# Patient Record
Sex: Male | Born: 1945 | Race: Black or African American | Hispanic: No | Smoking: Former smoker
Health system: Southern US, Community
[De-identification: ages and names within clinical notes are randomized; demographics above are authoritative.]

## PROBLEM LIST (undated history)

## (undated) DIAGNOSIS — H548 Legal blindness, as defined in USA: Secondary | ICD-10-CM

## (undated) DIAGNOSIS — I10 Essential (primary) hypertension: Secondary | ICD-10-CM

## (undated) DIAGNOSIS — A419 Sepsis, unspecified organism: Secondary | ICD-10-CM

## (undated) DIAGNOSIS — K219 Gastro-esophageal reflux disease without esophagitis: Secondary | ICD-10-CM

## (undated) DIAGNOSIS — I447 Left bundle-branch block, unspecified: Secondary | ICD-10-CM

## (undated) DIAGNOSIS — I959 Hypotension, unspecified: Secondary | ICD-10-CM

## (undated) DIAGNOSIS — I43 Cardiomyopathy in diseases classified elsewhere: Secondary | ICD-10-CM

## (undated) DIAGNOSIS — R0602 Shortness of breath: Secondary | ICD-10-CM

## (undated) DIAGNOSIS — E11622 Type 2 diabetes mellitus with other skin ulcer: Secondary | ICD-10-CM

## (undated) DIAGNOSIS — R195 Other fecal abnormalities: Secondary | ICD-10-CM

## (undated) DIAGNOSIS — I2510 Atherosclerotic heart disease of native coronary artery without angina pectoris: Secondary | ICD-10-CM

## (undated) DIAGNOSIS — I119 Hypertensive heart disease without heart failure: Secondary | ICD-10-CM

## (undated) DIAGNOSIS — N400 Enlarged prostate without lower urinary tract symptoms: Secondary | ICD-10-CM

## (undated) DIAGNOSIS — K436 Other and unspecified ventral hernia with obstruction, without gangrene: Secondary | ICD-10-CM

## (undated) DIAGNOSIS — G4733 Obstructive sleep apnea (adult) (pediatric): Secondary | ICD-10-CM

## (undated) DIAGNOSIS — M1990 Unspecified osteoarthritis, unspecified site: Secondary | ICD-10-CM

## (undated) DIAGNOSIS — L97109 Non-pressure chronic ulcer of unspecified thigh with unspecified severity: Secondary | ICD-10-CM

## (undated) DIAGNOSIS — E785 Hyperlipidemia, unspecified: Secondary | ICD-10-CM

## (undated) DIAGNOSIS — IMO0002 Reserved for concepts with insufficient information to code with codable children: Secondary | ICD-10-CM

## (undated) DIAGNOSIS — Z950 Presence of cardiac pacemaker: Secondary | ICD-10-CM

## (undated) DIAGNOSIS — B009 Herpesviral infection, unspecified: Secondary | ICD-10-CM

## (undated) DIAGNOSIS — E1165 Type 2 diabetes mellitus with hyperglycemia: Secondary | ICD-10-CM

## (undated) DIAGNOSIS — I4891 Unspecified atrial fibrillation: Secondary | ICD-10-CM

## (undated) DIAGNOSIS — E1142 Type 2 diabetes mellitus with diabetic polyneuropathy: Secondary | ICD-10-CM

## (undated) HISTORY — PX: EYE SURGERY: SHX253

## (undated) HISTORY — DX: Legal blindness, as defined in USA: H54.8

## (undated) HISTORY — DX: Type 2 diabetes mellitus with hyperglycemia: E11.65

## (undated) HISTORY — DX: Essential (primary) hypertension: I10

## (undated) HISTORY — DX: Obstructive sleep apnea (adult) (pediatric): G47.33

## (undated) HISTORY — DX: Unspecified osteoarthritis, unspecified site: M19.90

## (undated) HISTORY — DX: Presence of cardiac pacemaker: Z95.0

## (undated) HISTORY — PX: CATARACT EXTRACTION, BILATERAL: SHX1313

## (undated) HISTORY — DX: Left bundle-branch block, unspecified: I44.7

## (undated) HISTORY — DX: Other and unspecified ventral hernia with obstruction, without gangrene: K43.6

## (undated) HISTORY — DX: Herpesviral infection, unspecified: B00.9

## (undated) HISTORY — DX: Other fecal abnormalities: R19.5

## (undated) HISTORY — DX: Atherosclerotic heart disease of native coronary artery without angina pectoris: I25.10

## (undated) HISTORY — DX: Type 2 diabetes mellitus with other skin ulcer: E11.622

## (undated) HISTORY — DX: Cardiomyopathy in diseases classified elsewhere: I43

## (undated) HISTORY — DX: Hyperlipidemia, unspecified: E78.5

## (undated) HISTORY — DX: Morbid (severe) obesity due to excess calories: E66.01

## (undated) HISTORY — DX: Non-pressure chronic ulcer of unspecified thigh with unspecified severity: L97.109

## (undated) HISTORY — PX: HERNIA REPAIR: SHX51

## (undated) HISTORY — PX: REFRACTIVE SURGERY: SHX103

## (undated) HISTORY — DX: Reserved for concepts with insufficient information to code with codable children: IMO0002

## (undated) HISTORY — DX: Hypertensive heart disease without heart failure: I11.9

## (undated) HISTORY — DX: Benign prostatic hyperplasia without lower urinary tract symptoms: N40.0

---

## 1998-02-14 ENCOUNTER — Encounter: Admission: RE | Admit: 1998-02-14 | Discharge: 1998-02-14 | Payer: Self-pay | Admitting: Internal Medicine

## 1998-10-16 ENCOUNTER — Encounter: Admission: RE | Admit: 1998-10-16 | Discharge: 1998-10-16 | Payer: Self-pay | Admitting: Internal Medicine

## 1998-10-16 ENCOUNTER — Emergency Department (HOSPITAL_COMMUNITY): Admission: EM | Admit: 1998-10-16 | Discharge: 1998-10-16 | Payer: Self-pay | Admitting: Emergency Medicine

## 1999-01-29 ENCOUNTER — Encounter: Admission: RE | Admit: 1999-01-29 | Discharge: 1999-01-29 | Payer: Self-pay | Admitting: Internal Medicine

## 1999-04-26 ENCOUNTER — Emergency Department (HOSPITAL_COMMUNITY): Admission: EM | Admit: 1999-04-26 | Discharge: 1999-04-26 | Payer: Self-pay | Admitting: Emergency Medicine

## 1999-05-27 ENCOUNTER — Encounter: Admission: RE | Admit: 1999-05-27 | Discharge: 1999-05-27 | Payer: Self-pay | Admitting: Internal Medicine

## 1999-06-16 ENCOUNTER — Encounter: Admission: RE | Admit: 1999-06-16 | Discharge: 1999-06-16 | Payer: Self-pay | Admitting: Internal Medicine

## 1999-08-18 ENCOUNTER — Emergency Department (HOSPITAL_COMMUNITY): Admission: EM | Admit: 1999-08-18 | Discharge: 1999-08-18 | Payer: Self-pay | Admitting: Emergency Medicine

## 1999-08-18 ENCOUNTER — Encounter: Payer: Self-pay | Admitting: Emergency Medicine

## 1999-09-29 ENCOUNTER — Encounter: Admission: RE | Admit: 1999-09-29 | Discharge: 1999-09-29 | Payer: Self-pay | Admitting: Internal Medicine

## 1999-11-04 ENCOUNTER — Encounter: Payer: Self-pay | Admitting: Emergency Medicine

## 1999-11-04 ENCOUNTER — Emergency Department (HOSPITAL_COMMUNITY): Admission: EM | Admit: 1999-11-04 | Discharge: 1999-11-04 | Payer: Self-pay | Admitting: Emergency Medicine

## 2000-03-01 ENCOUNTER — Encounter: Admission: RE | Admit: 2000-03-01 | Discharge: 2000-03-01 | Payer: Self-pay | Admitting: Internal Medicine

## 2000-03-15 ENCOUNTER — Encounter: Admission: RE | Admit: 2000-03-15 | Discharge: 2000-03-15 | Payer: Self-pay | Admitting: Internal Medicine

## 2000-03-24 ENCOUNTER — Encounter: Admission: RE | Admit: 2000-03-24 | Discharge: 2000-03-24 | Payer: Self-pay | Admitting: Internal Medicine

## 2000-08-01 ENCOUNTER — Encounter: Admission: RE | Admit: 2000-08-01 | Discharge: 2000-08-01 | Payer: Self-pay | Admitting: Internal Medicine

## 2000-08-03 ENCOUNTER — Encounter: Admission: RE | Admit: 2000-08-03 | Discharge: 2000-08-03 | Payer: Self-pay

## 2000-12-19 ENCOUNTER — Encounter: Admission: RE | Admit: 2000-12-19 | Discharge: 2000-12-19 | Payer: Self-pay | Admitting: Internal Medicine

## 2001-01-09 ENCOUNTER — Encounter: Payer: Self-pay | Admitting: Emergency Medicine

## 2001-01-09 ENCOUNTER — Emergency Department (HOSPITAL_COMMUNITY): Admission: EM | Admit: 2001-01-09 | Discharge: 2001-01-09 | Payer: Self-pay

## 2001-01-22 ENCOUNTER — Emergency Department (HOSPITAL_COMMUNITY): Admission: EM | Admit: 2001-01-22 | Discharge: 2001-01-22 | Payer: Self-pay | Admitting: *Deleted

## 2001-01-22 ENCOUNTER — Encounter: Payer: Self-pay | Admitting: Emergency Medicine

## 2001-05-25 ENCOUNTER — Encounter: Admission: RE | Admit: 2001-05-25 | Discharge: 2001-05-25 | Payer: Self-pay

## 2001-06-07 ENCOUNTER — Encounter: Admission: RE | Admit: 2001-06-07 | Discharge: 2001-06-07 | Payer: Self-pay

## 2001-06-21 ENCOUNTER — Encounter: Admission: RE | Admit: 2001-06-21 | Discharge: 2001-06-21 | Payer: Self-pay | Admitting: Internal Medicine

## 2002-03-14 ENCOUNTER — Encounter: Admission: RE | Admit: 2002-03-14 | Discharge: 2002-03-14 | Payer: Self-pay | Admitting: Internal Medicine

## 2002-03-15 ENCOUNTER — Encounter: Admission: RE | Admit: 2002-03-15 | Discharge: 2002-03-15 | Payer: Self-pay | Admitting: Internal Medicine

## 2002-06-07 ENCOUNTER — Emergency Department (HOSPITAL_COMMUNITY): Admission: EM | Admit: 2002-06-07 | Discharge: 2002-06-07 | Payer: Self-pay | Admitting: Emergency Medicine

## 2002-06-19 ENCOUNTER — Encounter (INDEPENDENT_AMBULATORY_CARE_PROVIDER_SITE_OTHER): Payer: Self-pay | Admitting: Specialist

## 2002-06-19 ENCOUNTER — Encounter: Admission: RE | Admit: 2002-06-19 | Discharge: 2002-06-19 | Payer: Self-pay | Admitting: Internal Medicine

## 2002-06-19 ENCOUNTER — Other Ambulatory Visit: Admission: RE | Admit: 2002-06-19 | Discharge: 2002-06-19 | Payer: Self-pay | Admitting: *Deleted

## 2002-06-19 DIAGNOSIS — A6000 Herpesviral infection of urogenital system, unspecified: Secondary | ICD-10-CM | POA: Insufficient documentation

## 2002-08-01 ENCOUNTER — Encounter: Admission: RE | Admit: 2002-08-01 | Discharge: 2002-08-01 | Payer: Self-pay | Admitting: Internal Medicine

## 2002-08-27 ENCOUNTER — Encounter: Admission: RE | Admit: 2002-08-27 | Discharge: 2002-08-27 | Payer: Self-pay | Admitting: Internal Medicine

## 2002-10-15 ENCOUNTER — Encounter: Admission: RE | Admit: 2002-10-15 | Discharge: 2002-10-15 | Payer: Self-pay | Admitting: Internal Medicine

## 2002-10-22 ENCOUNTER — Encounter: Admission: RE | Admit: 2002-10-22 | Discharge: 2002-10-22 | Payer: Self-pay | Admitting: Internal Medicine

## 2002-10-25 ENCOUNTER — Encounter: Payer: Self-pay | Admitting: Internal Medicine

## 2002-10-25 ENCOUNTER — Inpatient Hospital Stay (HOSPITAL_COMMUNITY): Admission: EM | Admit: 2002-10-25 | Discharge: 2002-10-28 | Payer: Self-pay | Admitting: Emergency Medicine

## 2002-10-25 ENCOUNTER — Encounter: Admission: RE | Admit: 2002-10-25 | Discharge: 2002-10-25 | Payer: Self-pay | Admitting: Internal Medicine

## 2002-10-25 ENCOUNTER — Ambulatory Visit (HOSPITAL_COMMUNITY): Admission: RE | Admit: 2002-10-25 | Discharge: 2002-10-25 | Payer: Self-pay | Admitting: Internal Medicine

## 2002-10-27 ENCOUNTER — Encounter: Payer: Self-pay | Admitting: Cardiovascular Disease

## 2002-11-20 ENCOUNTER — Encounter: Admission: RE | Admit: 2002-11-20 | Discharge: 2002-11-20 | Payer: Self-pay | Admitting: Internal Medicine

## 2002-11-27 ENCOUNTER — Encounter: Admission: RE | Admit: 2002-11-27 | Discharge: 2002-11-27 | Payer: Self-pay | Admitting: Internal Medicine

## 2002-11-28 ENCOUNTER — Encounter: Admission: RE | Admit: 2002-11-28 | Discharge: 2002-11-28 | Payer: Self-pay | Admitting: Internal Medicine

## 2002-11-30 ENCOUNTER — Encounter: Admission: RE | Admit: 2002-11-30 | Discharge: 2002-11-30 | Payer: Self-pay | Admitting: Internal Medicine

## 2002-12-03 ENCOUNTER — Encounter: Admission: RE | Admit: 2002-12-03 | Discharge: 2002-12-03 | Payer: Self-pay | Admitting: Internal Medicine

## 2002-12-06 ENCOUNTER — Encounter: Admission: RE | Admit: 2002-12-06 | Discharge: 2003-03-06 | Payer: Self-pay | Admitting: *Deleted

## 2002-12-10 ENCOUNTER — Encounter: Admission: RE | Admit: 2002-12-10 | Discharge: 2002-12-10 | Payer: Self-pay | Admitting: Internal Medicine

## 2002-12-24 ENCOUNTER — Encounter: Admission: RE | Admit: 2002-12-24 | Discharge: 2002-12-24 | Payer: Self-pay | Admitting: Internal Medicine

## 2003-01-29 ENCOUNTER — Encounter: Admission: RE | Admit: 2003-01-29 | Discharge: 2003-01-29 | Payer: Self-pay | Admitting: Internal Medicine

## 2003-04-01 ENCOUNTER — Encounter: Admission: RE | Admit: 2003-04-01 | Discharge: 2003-04-01 | Payer: Self-pay | Admitting: Internal Medicine

## 2003-05-09 ENCOUNTER — Encounter: Admission: RE | Admit: 2003-05-09 | Discharge: 2003-05-09 | Payer: Self-pay | Admitting: Internal Medicine

## 2003-06-13 ENCOUNTER — Encounter: Admission: RE | Admit: 2003-06-13 | Discharge: 2003-06-13 | Payer: Self-pay | Admitting: Internal Medicine

## 2003-08-12 ENCOUNTER — Encounter: Admission: RE | Admit: 2003-08-12 | Discharge: 2003-08-12 | Payer: Self-pay | Admitting: Internal Medicine

## 2003-08-17 DIAGNOSIS — Z950 Presence of cardiac pacemaker: Secondary | ICD-10-CM

## 2003-08-17 HISTORY — DX: Presence of cardiac pacemaker: Z95.0

## 2003-09-24 ENCOUNTER — Encounter: Admission: RE | Admit: 2003-09-24 | Discharge: 2003-09-24 | Payer: Self-pay | Admitting: Internal Medicine

## 2003-10-09 ENCOUNTER — Ambulatory Visit (HOSPITAL_COMMUNITY): Admission: RE | Admit: 2003-10-09 | Discharge: 2003-10-09 | Payer: Self-pay | Admitting: Internal Medicine

## 2003-10-09 ENCOUNTER — Encounter: Admission: RE | Admit: 2003-10-09 | Discharge: 2003-10-09 | Payer: Self-pay | Admitting: Internal Medicine

## 2003-10-15 HISTORY — PX: INSERT / REPLACE / REMOVE PACEMAKER: SUR710

## 2003-10-16 ENCOUNTER — Inpatient Hospital Stay (HOSPITAL_COMMUNITY): Admission: EM | Admit: 2003-10-16 | Discharge: 2003-10-25 | Payer: Self-pay | Admitting: Emergency Medicine

## 2003-10-18 ENCOUNTER — Encounter: Payer: Self-pay | Admitting: Internal Medicine

## 2003-10-22 HISTORY — PX: CARDIAC CATHETERIZATION: SHX172

## 2003-10-26 ENCOUNTER — Ambulatory Visit (HOSPITAL_BASED_OUTPATIENT_CLINIC_OR_DEPARTMENT_OTHER): Admission: RE | Admit: 2003-10-26 | Discharge: 2003-10-26 | Payer: Self-pay | Admitting: Infectious Diseases

## 2003-11-15 ENCOUNTER — Encounter: Admission: RE | Admit: 2003-11-15 | Discharge: 2003-11-15 | Payer: Self-pay | Admitting: Internal Medicine

## 2003-12-15 HISTORY — PX: OTHER SURGICAL HISTORY: SHX169

## 2004-01-09 ENCOUNTER — Inpatient Hospital Stay (HOSPITAL_COMMUNITY): Admission: RE | Admit: 2004-01-09 | Discharge: 2004-01-11 | Payer: Self-pay | Admitting: Cardiovascular Disease

## 2004-04-27 ENCOUNTER — Ambulatory Visit: Payer: Self-pay | Admitting: Internal Medicine

## 2004-05-12 ENCOUNTER — Ambulatory Visit: Payer: Self-pay | Admitting: Internal Medicine

## 2004-05-19 ENCOUNTER — Ambulatory Visit: Payer: Self-pay | Admitting: Internal Medicine

## 2004-08-25 ENCOUNTER — Ambulatory Visit: Payer: Self-pay | Admitting: Internal Medicine

## 2004-08-28 ENCOUNTER — Ambulatory Visit: Payer: Self-pay | Admitting: Internal Medicine

## 2004-10-14 ENCOUNTER — Inpatient Hospital Stay (HOSPITAL_COMMUNITY): Admission: EM | Admit: 2004-10-14 | Discharge: 2004-10-17 | Payer: Self-pay | Admitting: Emergency Medicine

## 2004-10-14 ENCOUNTER — Ambulatory Visit: Payer: Self-pay | Admitting: Infectious Diseases

## 2004-11-02 ENCOUNTER — Ambulatory Visit: Payer: Self-pay | Admitting: Internal Medicine

## 2004-11-02 ENCOUNTER — Ambulatory Visit (HOSPITAL_COMMUNITY): Admission: RE | Admit: 2004-11-02 | Discharge: 2004-11-02 | Payer: Self-pay | Admitting: Internal Medicine

## 2004-11-05 ENCOUNTER — Ambulatory Visit: Payer: Self-pay | Admitting: Internal Medicine

## 2004-11-13 ENCOUNTER — Ambulatory Visit: Payer: Self-pay | Admitting: Internal Medicine

## 2005-05-07 ENCOUNTER — Ambulatory Visit: Payer: Self-pay | Admitting: Internal Medicine

## 2005-05-07 ENCOUNTER — Ambulatory Visit (HOSPITAL_COMMUNITY): Admission: RE | Admit: 2005-05-07 | Discharge: 2005-05-07 | Payer: Self-pay | Admitting: Internal Medicine

## 2005-05-21 ENCOUNTER — Ambulatory Visit: Payer: Self-pay | Admitting: Hospitalist

## 2005-06-11 ENCOUNTER — Ambulatory Visit: Payer: Self-pay | Admitting: Hospitalist

## 2005-06-28 ENCOUNTER — Ambulatory Visit: Payer: Self-pay | Admitting: Internal Medicine

## 2005-07-16 HISTORY — PX: VENTRAL HERNIA REPAIR: SHX424

## 2005-07-19 ENCOUNTER — Ambulatory Visit: Payer: Self-pay | Admitting: Internal Medicine

## 2005-07-19 ENCOUNTER — Ambulatory Visit (HOSPITAL_COMMUNITY): Admission: RE | Admit: 2005-07-19 | Discharge: 2005-07-19 | Payer: Self-pay | Admitting: Internal Medicine

## 2005-07-21 ENCOUNTER — Ambulatory Visit: Payer: Self-pay | Admitting: Internal Medicine

## 2005-07-26 ENCOUNTER — Encounter (INDEPENDENT_AMBULATORY_CARE_PROVIDER_SITE_OTHER): Payer: Self-pay | Admitting: *Deleted

## 2005-07-27 ENCOUNTER — Inpatient Hospital Stay (HOSPITAL_COMMUNITY): Admission: AD | Admit: 2005-07-27 | Discharge: 2005-08-01 | Payer: Self-pay | Admitting: General Surgery

## 2005-08-05 ENCOUNTER — Ambulatory Visit (HOSPITAL_COMMUNITY): Admission: RE | Admit: 2005-08-05 | Discharge: 2005-08-05 | Payer: Self-pay | Admitting: Internal Medicine

## 2005-08-05 ENCOUNTER — Encounter (INDEPENDENT_AMBULATORY_CARE_PROVIDER_SITE_OTHER): Payer: Self-pay | Admitting: *Deleted

## 2005-08-27 ENCOUNTER — Ambulatory Visit: Payer: Self-pay | Admitting: Hospitalist

## 2005-09-03 ENCOUNTER — Ambulatory Visit: Payer: Self-pay | Admitting: Internal Medicine

## 2005-11-04 ENCOUNTER — Ambulatory Visit: Payer: Self-pay | Admitting: Hospitalist

## 2005-11-25 ENCOUNTER — Ambulatory Visit: Payer: Self-pay | Admitting: Internal Medicine

## 2005-11-25 ENCOUNTER — Ambulatory Visit (HOSPITAL_COMMUNITY): Admission: RE | Admit: 2005-11-25 | Discharge: 2005-11-25 | Payer: Self-pay | Admitting: Internal Medicine

## 2005-12-02 ENCOUNTER — Ambulatory Visit: Payer: Self-pay | Admitting: Internal Medicine

## 2005-12-09 ENCOUNTER — Ambulatory Visit: Payer: Self-pay | Admitting: Internal Medicine

## 2005-12-16 ENCOUNTER — Ambulatory Visit: Payer: Self-pay | Admitting: Internal Medicine

## 2005-12-31 ENCOUNTER — Ambulatory Visit: Payer: Self-pay | Admitting: Internal Medicine

## 2006-01-03 ENCOUNTER — Ambulatory Visit: Payer: Self-pay | Admitting: Internal Medicine

## 2006-01-06 ENCOUNTER — Ambulatory Visit: Payer: Self-pay | Admitting: Internal Medicine

## 2006-01-12 ENCOUNTER — Ambulatory Visit: Payer: Self-pay | Admitting: Internal Medicine

## 2006-01-20 IMAGING — CR DG CHEST 2V
2 series · 2 of 2 positions shown · non-contrast
Comparison: none.

CLINICAL DATA: Dyspnea. 
 TWO VIEW CHEST ? 10/09/03 ([DATE] HOURS)

[view not recorded (1 of 2)]
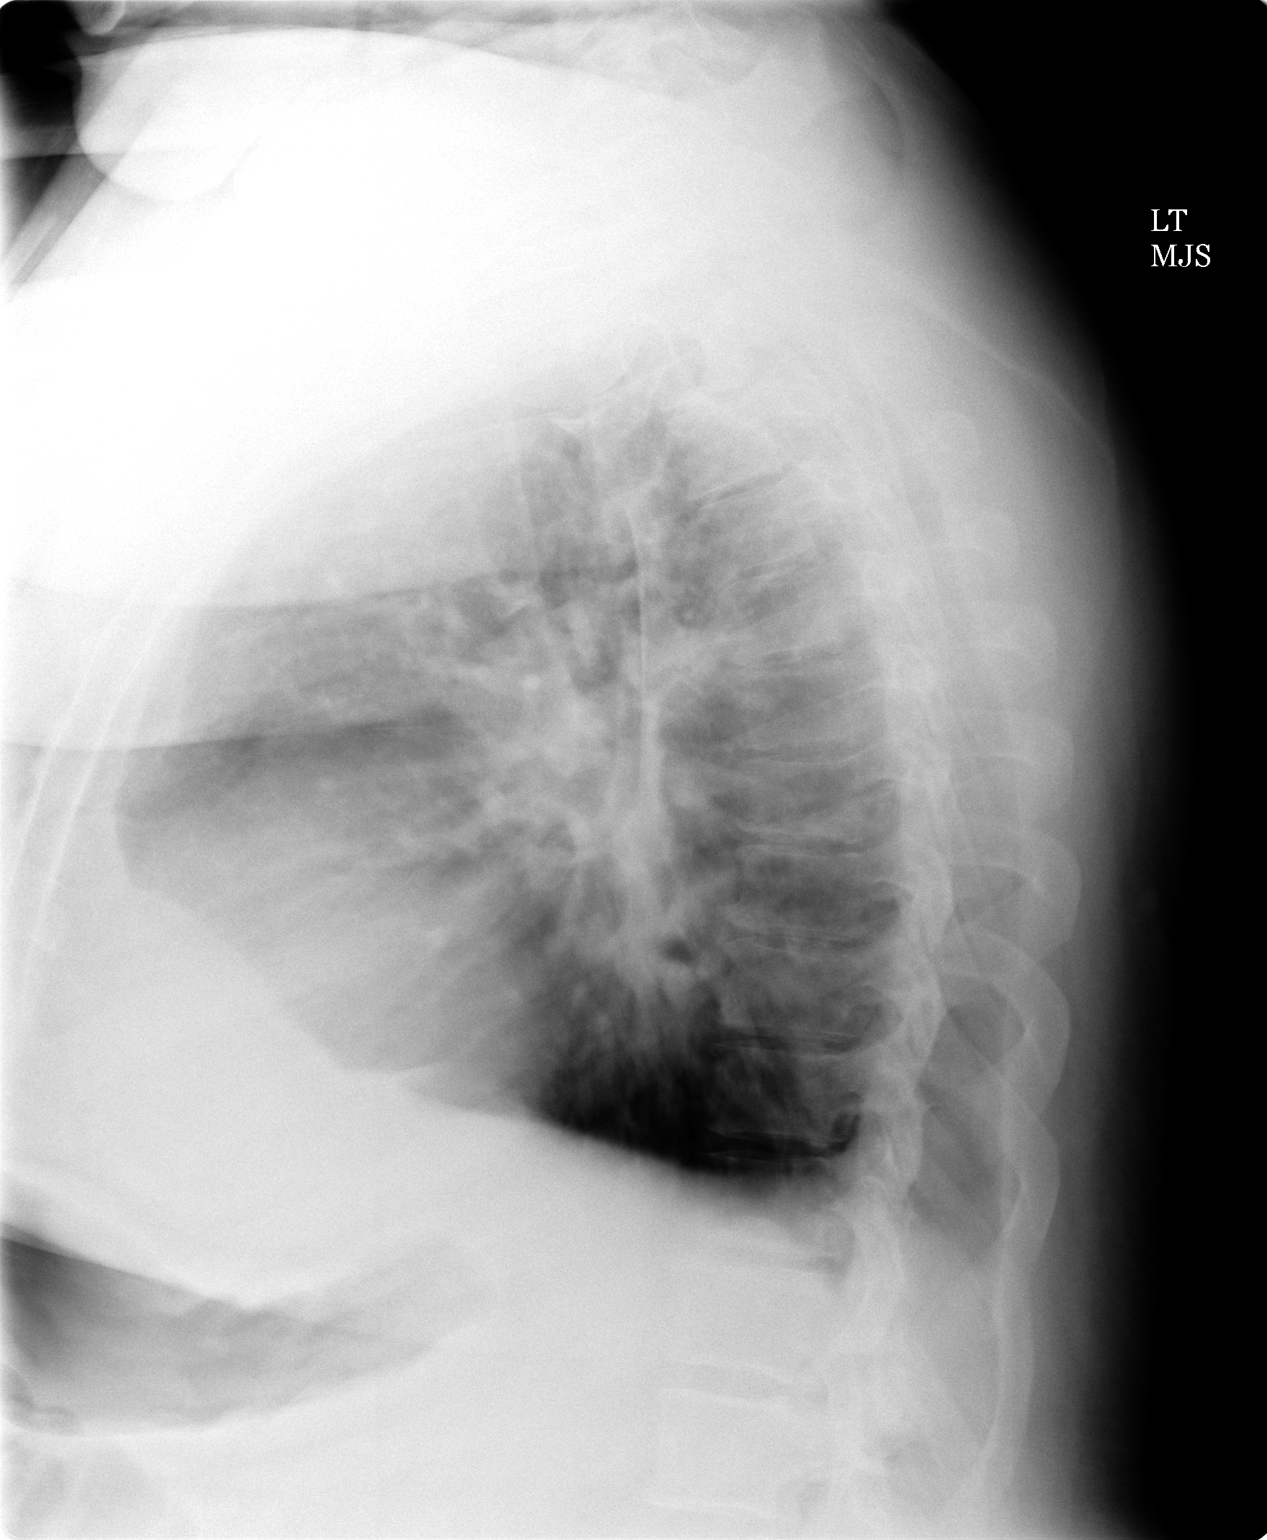

[view not recorded (2 of 2)]
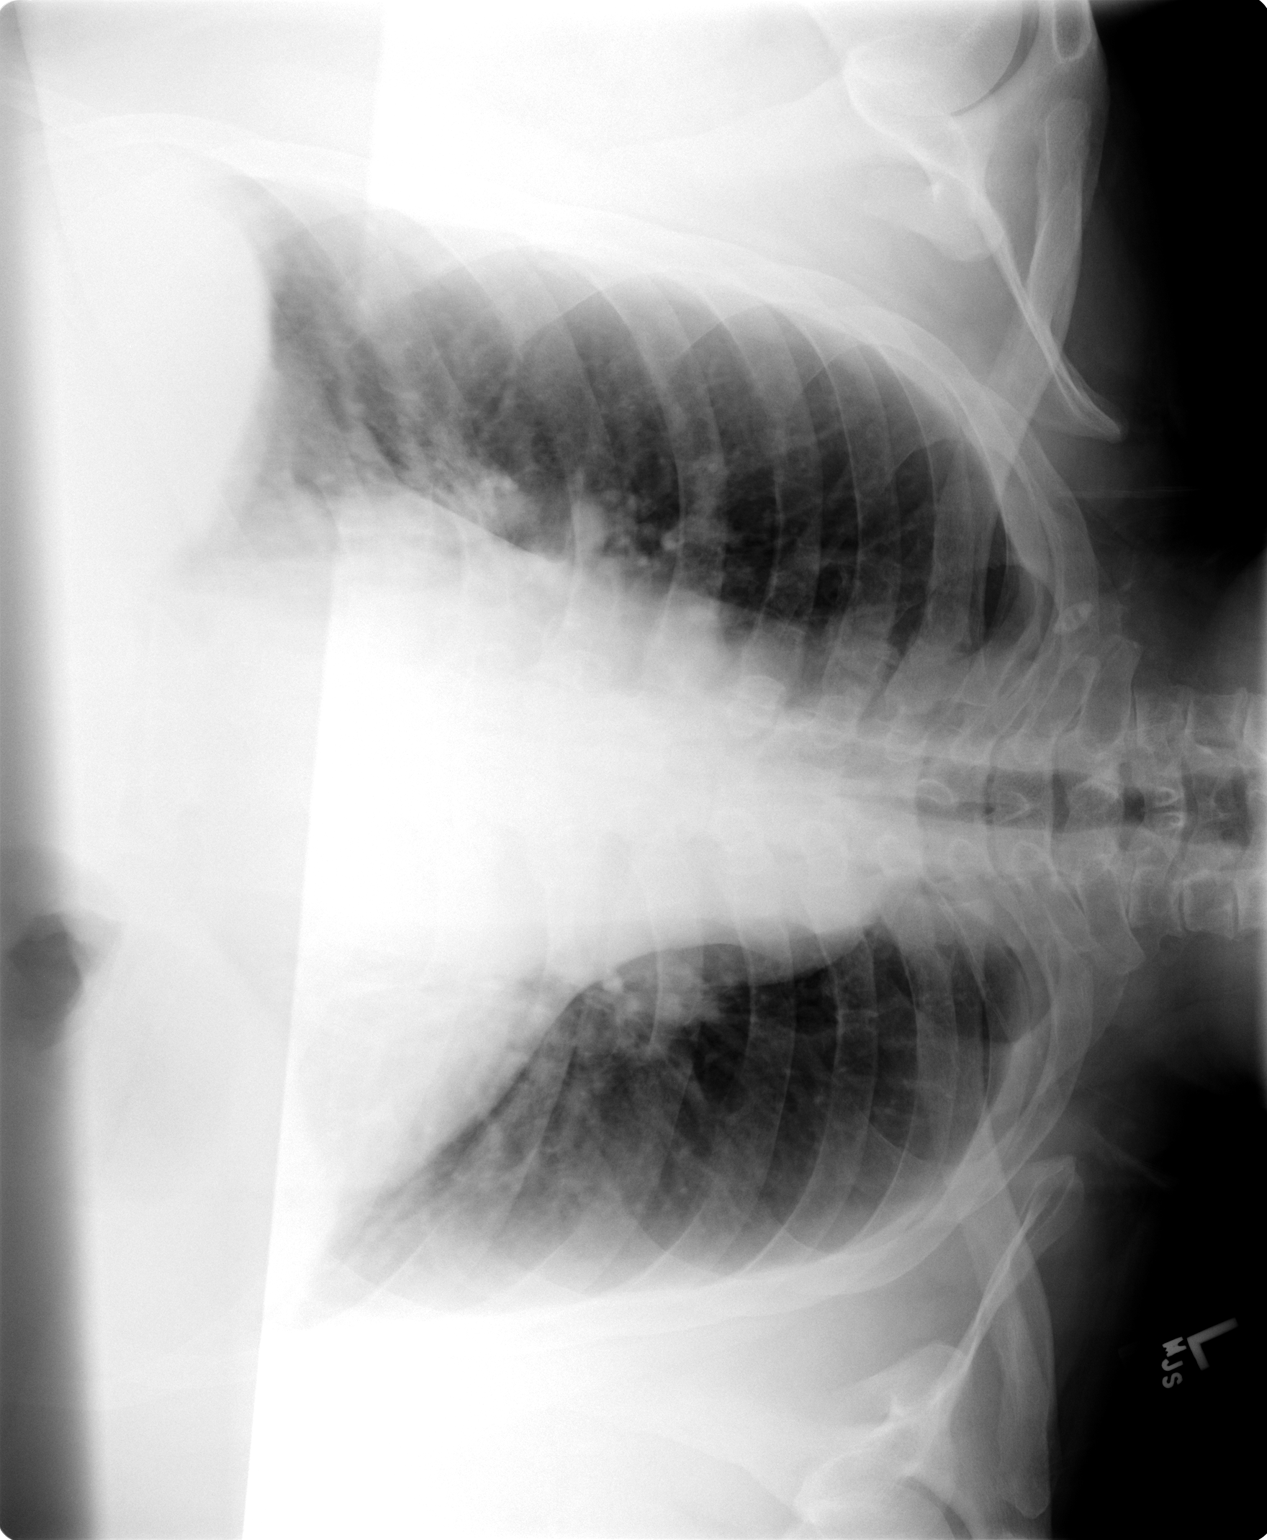

[2 of 2 positions shown; findings below may reference images not displayed]

FINDINGS 
 The heart is moderately enlarged.  The pulmonary vasculature is mildly congested without interstitial edema.  The lungs are under inflated with bibasilar atelectatic change. Motion artifact limits the study.  No pneumothoraces are seen.  A small left pleural effusion is present.  
 IMPRESSION 
 Cardiomegaly and mild vascular congestion without interstitial edema.  
 Bibasilar atelectasis. 
 Small left effusion.

## 2006-01-27 IMAGING — CR DG CHEST 2V
2 series · 2 of 2 positions shown · non-contrast
Comparison: 10/09/03.

CLINICAL DATA: Shortness of breath x2 weeks. 
 TWO VIEW CHEST ? 10/16/03

[view not recorded (1 of 2)]
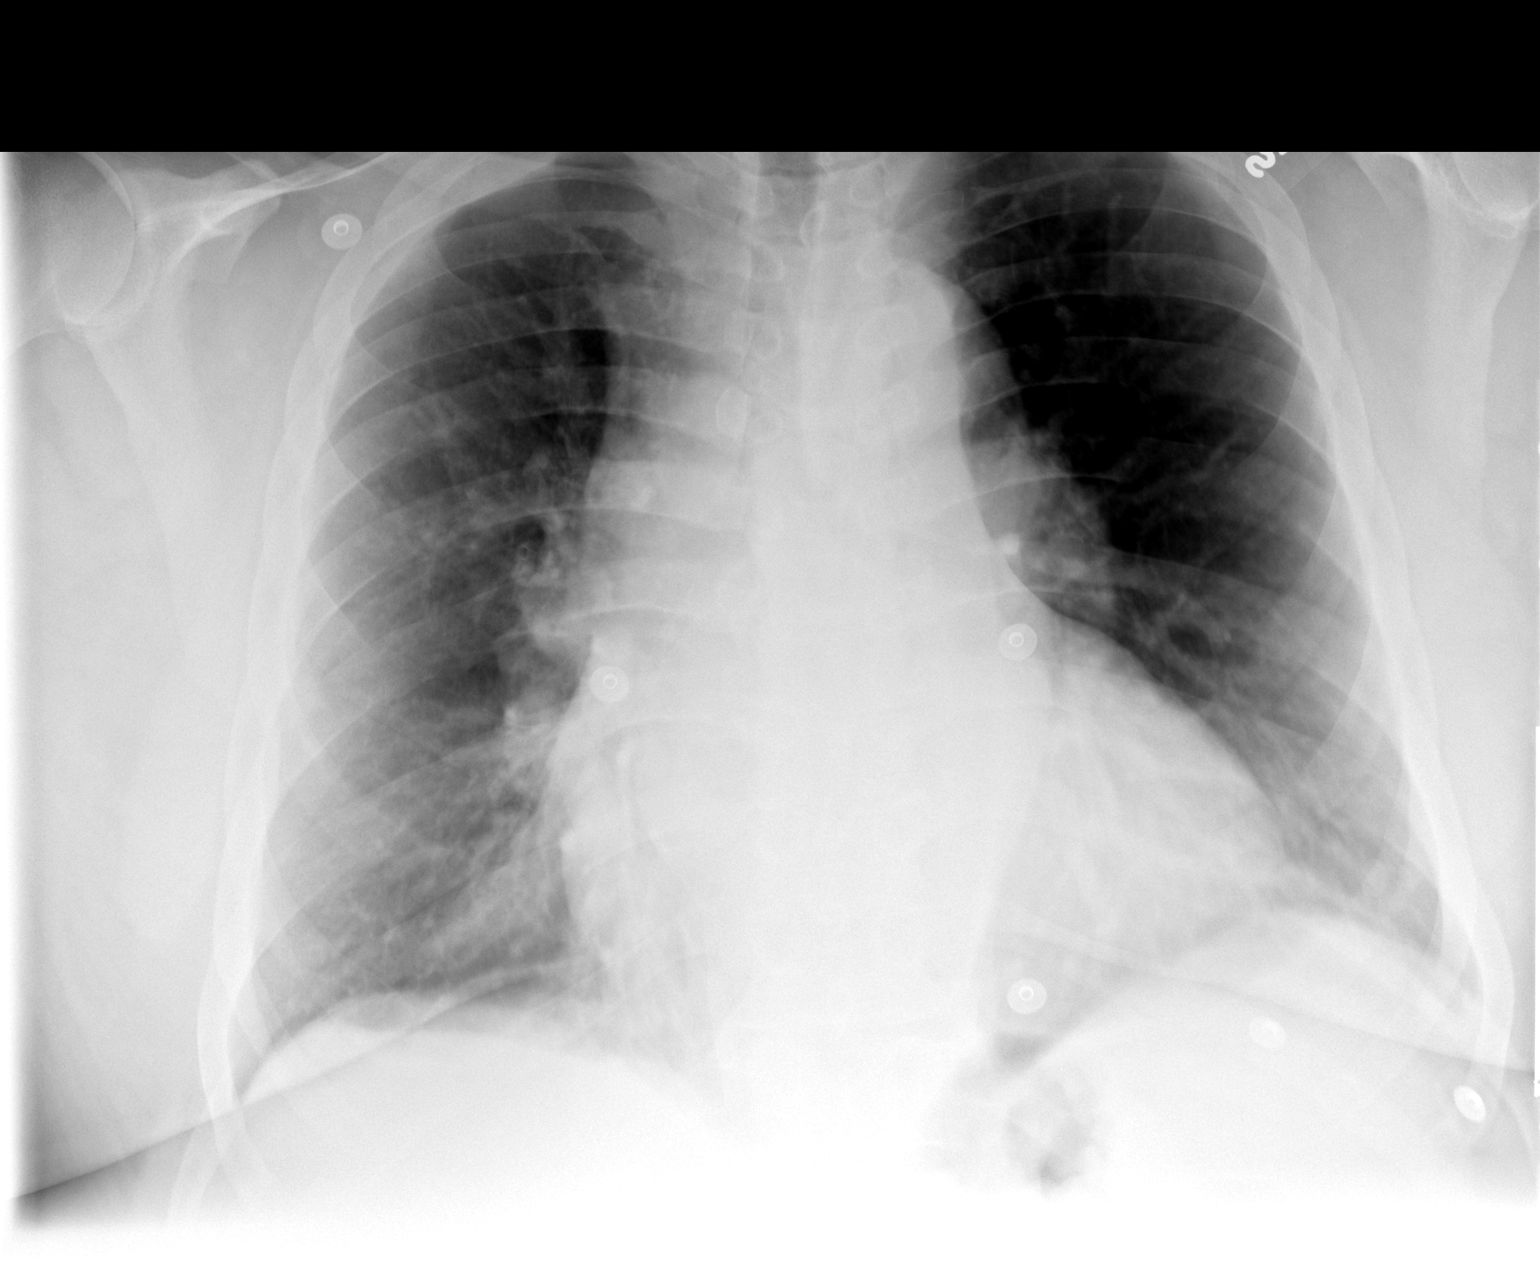

[view not recorded (2 of 2)]
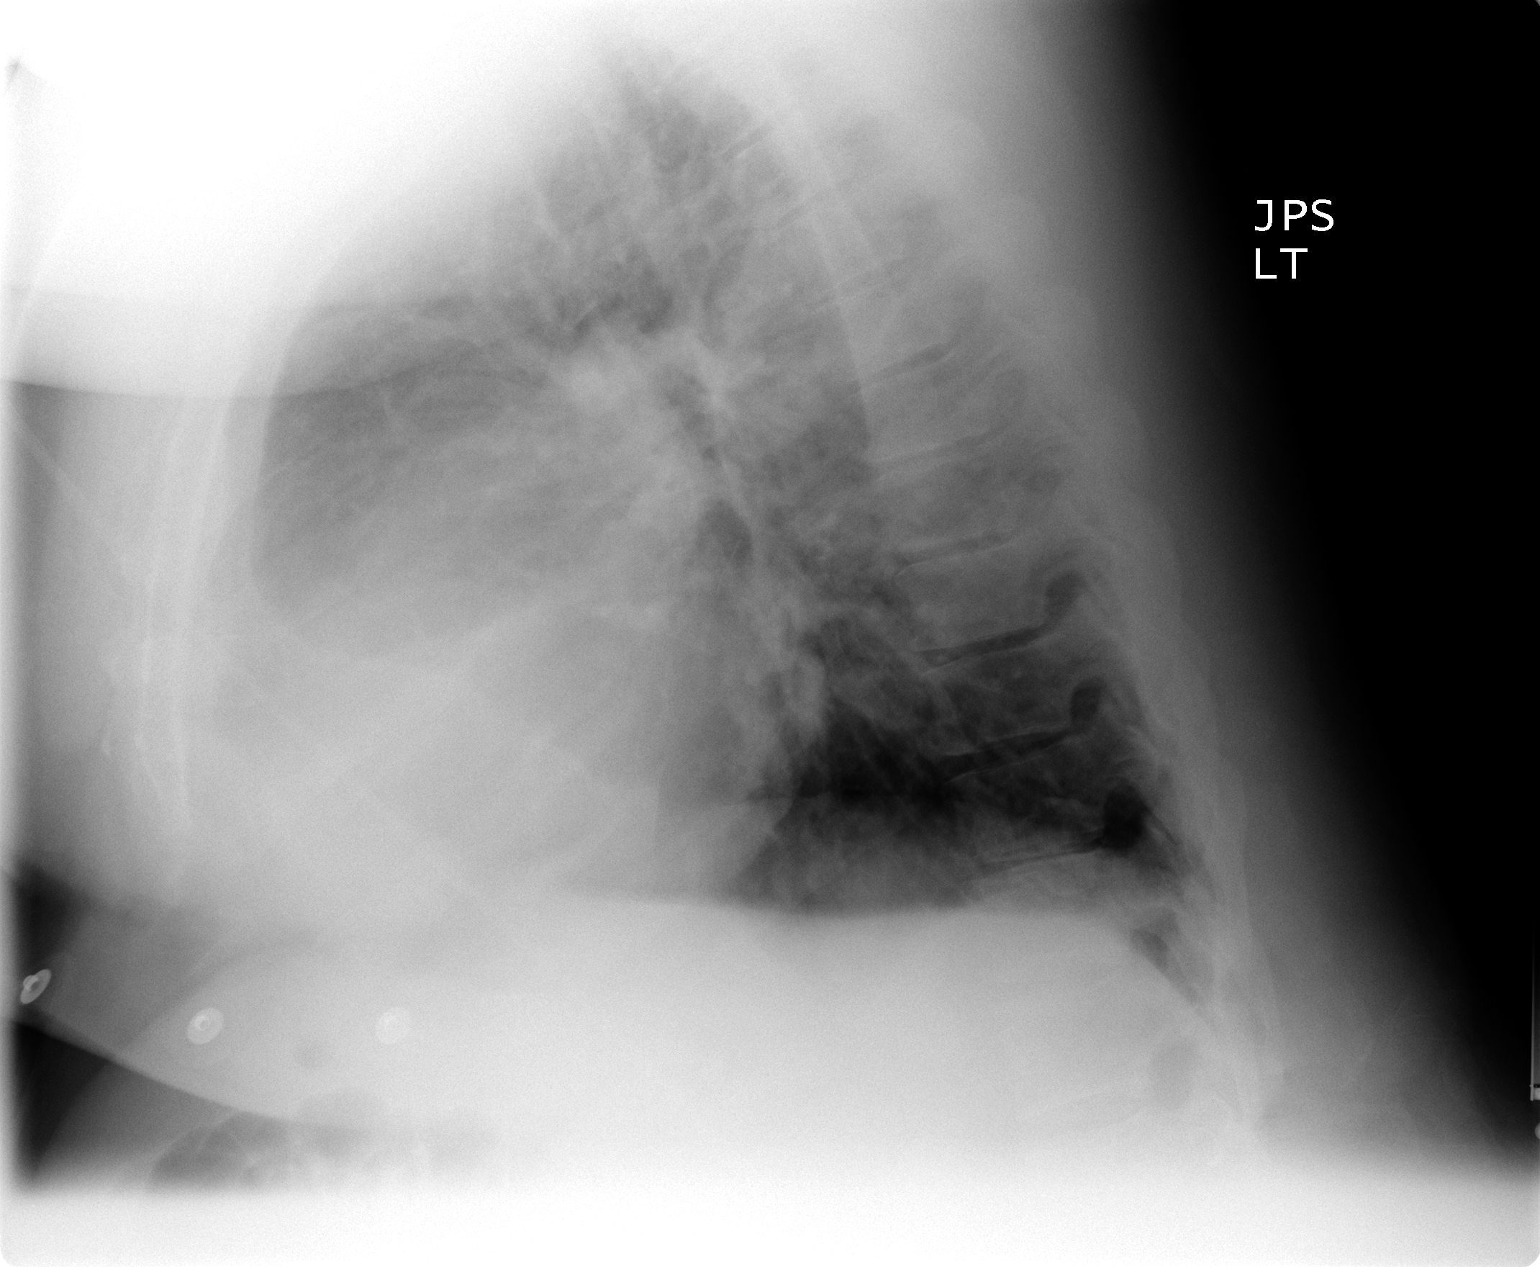

[2 of 2 positions shown; findings below may reference images not displayed]

There is cardiomegaly present.  There are mildly accentuated bronchovascular markings, which are unchanged.  There is mild motion present on this study.  There are no acute infiltrative or edematous changes.
IMPRESSION: Cardiomegaly with accentuated bronchovascular markings-unchanged.  There are mild motion artifacts present on the study.

## 2006-01-27 IMAGING — CT CT EXTREM LOW BILAT W/ CM
2 of 5 series · 7 of 14 positions shown, 8 images · IV contrast (150 ML OMNI)
Comparison: none

CLINICAL DATA: Shortness of breath for two weeks.  Creatinine 1.4.  Patient is very large, weighs 360 pounds.  Now having some wheezing.
 CT SCAN OF THE CHEST WITH CONTRAST ? PULMONARY EMBOLISM PROTOCOL; CT SCAN LIMITED LOWER EXTREMITY BILATERAL WITH CONTRAST
 After the intravenous injection of 150 cc of Omnipaque 300, a series of scans through the entire chest are made and show very poor opacification of the pulmonary arteries due to the patient?s very large size.  There are noted questionable filling defects associated with the right and lower lobe pulmonary arteries and there is some atelectasis and/or scarring of the right lung base.  The heart is within the normal limit with considerable pericardial fat being noted.  There are noted some mildly enlarged lymph nodes, the largest being in the region of the aortopulmonary window and that node measures 2.5 x 1.8 cm but shows a fatty center suggesting it is probably benign.  The tracheobronchial tree appears patent as far as can be seen.  Bony thorax shows some hypertrophic spurs but no fracture or metastatic disease. There is no pneumothorax or pleural effusion.  
 IMPRESSION 
 Poor overall opacification of pulmonary arteries.  There may be some filling defects within the right lower pulmonary artery with an area of associated atelectasis and/or infiltrate or scarring.  Clinical correlation is recommended.  This patient is very large and I do not feel a better study can be obtained with his body habitus. 
 CT LOWER EXTREMITIES
 Utilizing the same contrast given for the abdomen, a series of scans of the lower extremities show no definite deep venous thrombi.  Deep veins of the pelvis also appear normal as far as can be seen.  
 No deep venous thrombi in the legs or pelvis with the technique being severely compromised by the poor opacification and the size of the patient.

[Series 2: *don't forget:recon (date) offon · axial · 0.87mm/px · z∈[-258,-113]mm · 3 of 117 slices shown, 4 images]
[im 30/117  soft-tissue]
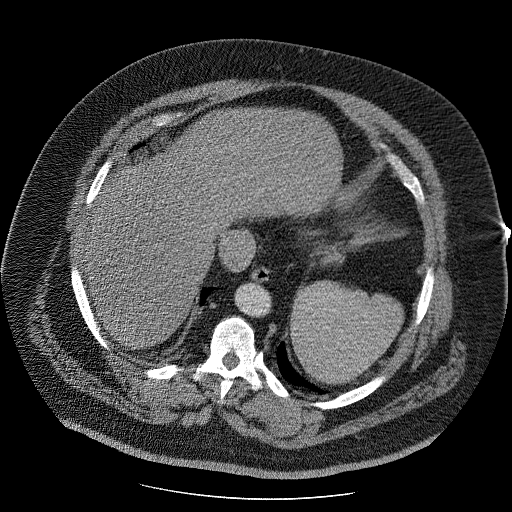
[im 30/117  bone]
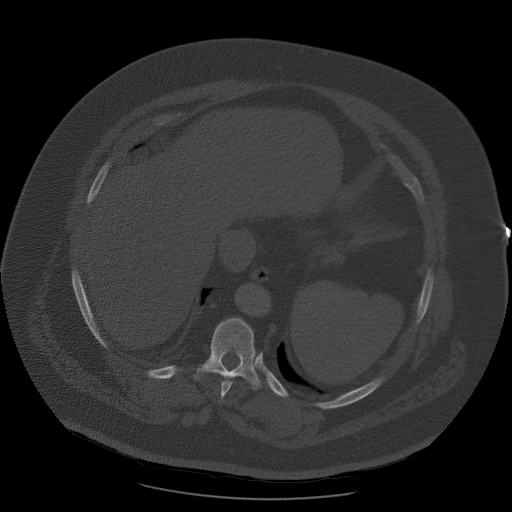
[im 59/117  bone]
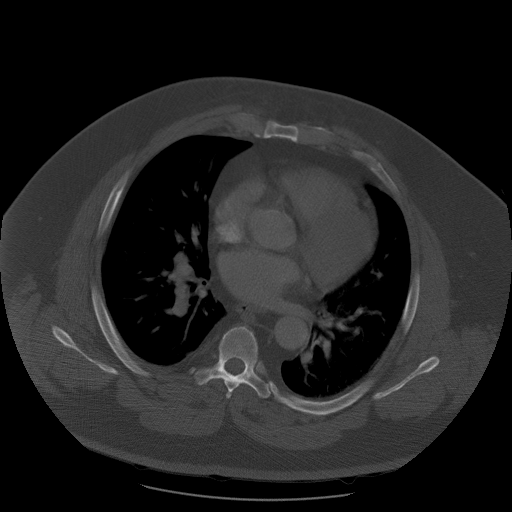
[im 88/117  bone]
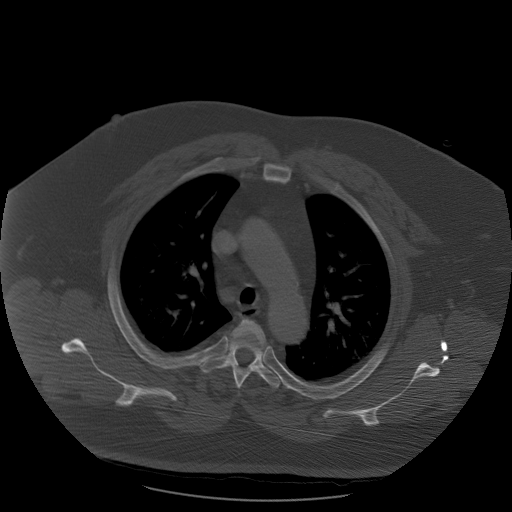

[Series 4: recon 3: *don't forget:recon 2/ · axial · 0.62mm/px · z∈[-225,-129]mm · 4 of 129 slices shown]
[im 26/129  bone]
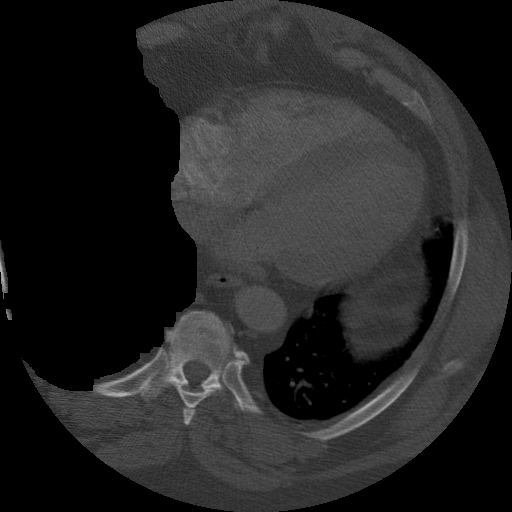
[im 52/129  bone]
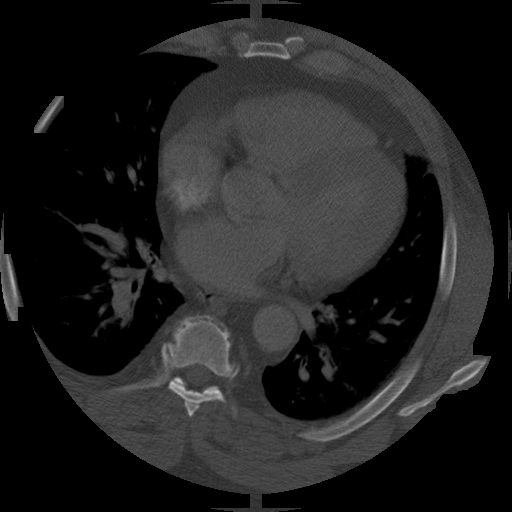
[im 77/129  bone]
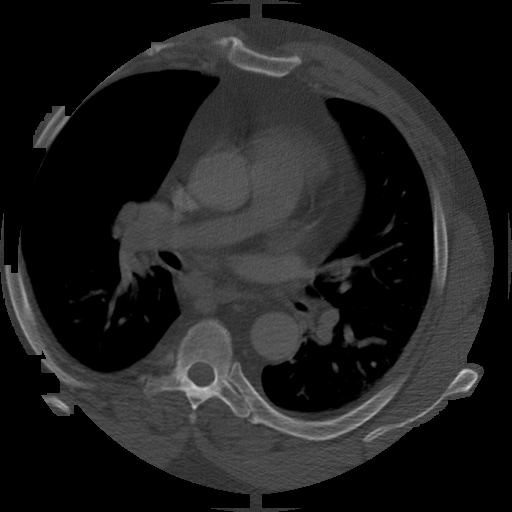
[im 103/129  bone]
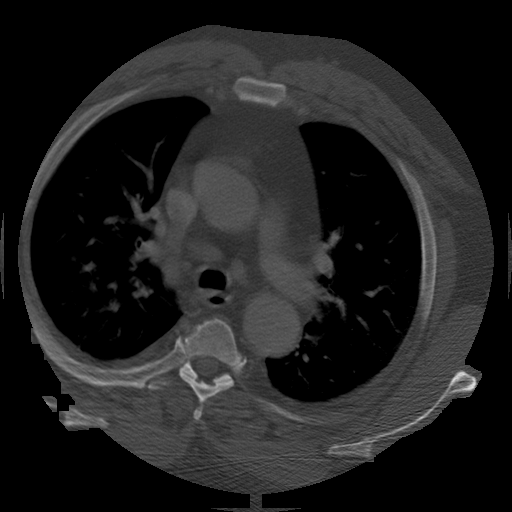

[7 of 14 positions shown; findings below may reference images not displayed]

## 2006-02-05 IMAGING — CR DG CHEST 2V
2 series · 2 of 2 positions shown · non-contrast
Comparison: 10/16/03.

CLINICAL DATA: Dyspnea.  Follow-up aeration.
 TWO VIEW CHEST ? 10/25/03

[view not recorded (1 of 2)]
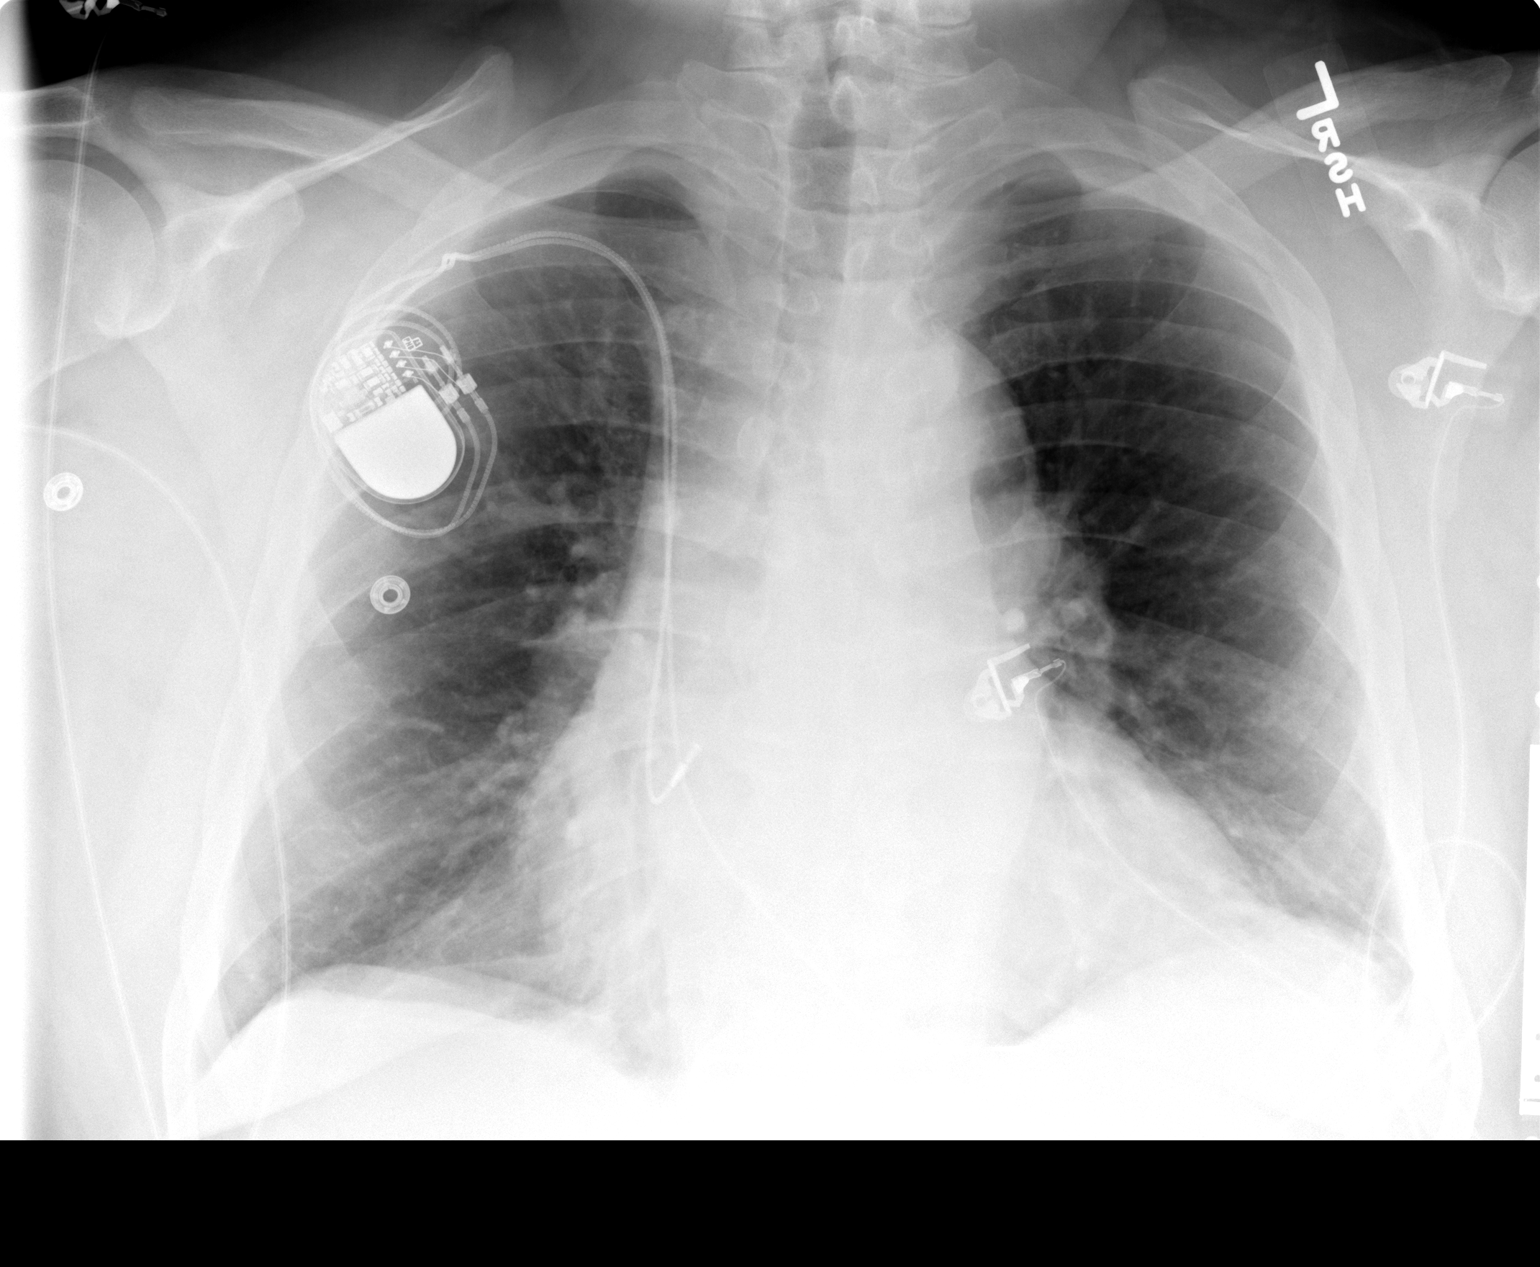

[view not recorded (2 of 2)]
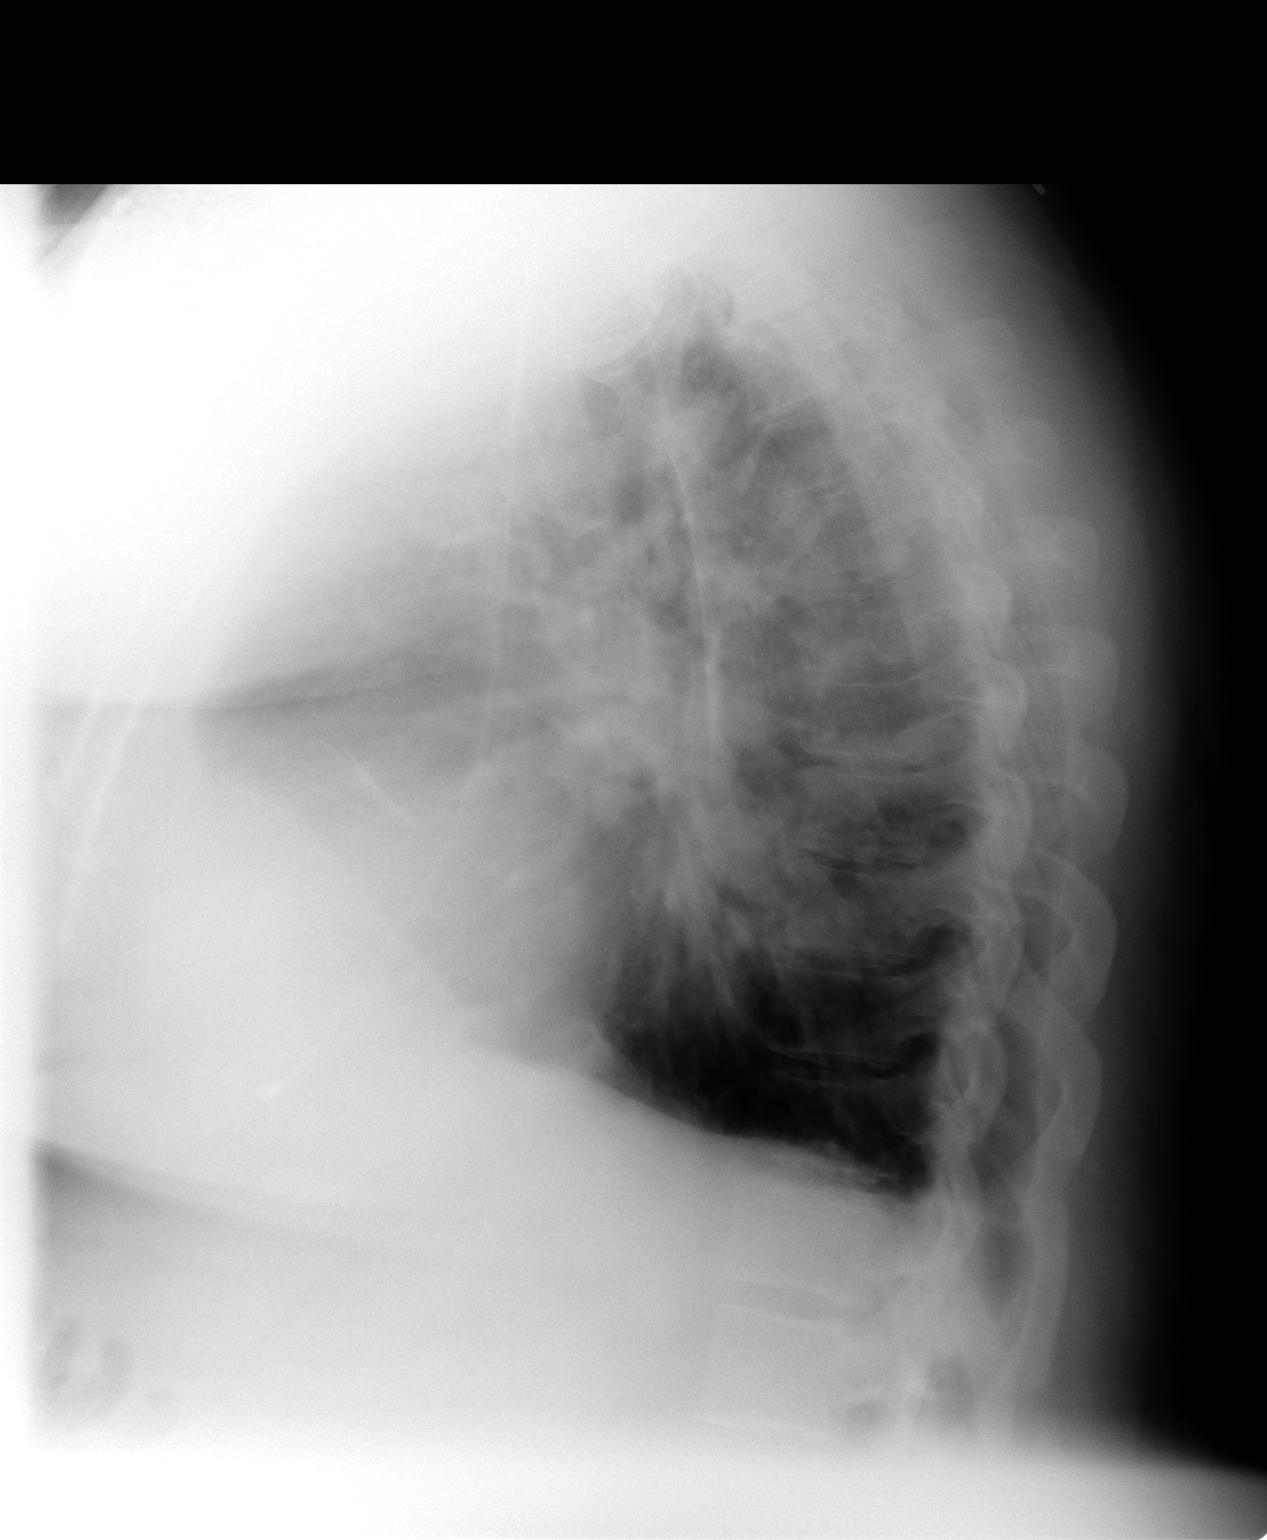

[2 of 2 positions shown; findings below may reference images not displayed]

There is cardiomegaly with mildly accentuated bronchovascular markings-unchanged.  A permanent pacemaker has been placed with right atrial and right ventricular leads present, which appear radiographically intact.  There is no pneumothorax.  There are no infiltrative or edematous changes.  There is motion present on the lateral view of the chest.
IMPRESSION: Interval placement of a permanent pacemaker with right atrial and right ventricular leads.  No pneumothorax.  
 Cardiomegaly.
 No evidence for active chest disease.

## 2006-03-29 ENCOUNTER — Ambulatory Visit: Payer: Self-pay | Admitting: Internal Medicine

## 2006-03-31 ENCOUNTER — Ambulatory Visit: Payer: Self-pay | Admitting: Internal Medicine

## 2006-04-05 ENCOUNTER — Ambulatory Visit: Payer: Self-pay | Admitting: Hospitalist

## 2006-04-06 ENCOUNTER — Ambulatory Visit: Payer: Self-pay | Admitting: Internal Medicine

## 2006-04-14 ENCOUNTER — Ambulatory Visit: Payer: Self-pay | Admitting: Internal Medicine

## 2006-04-20 ENCOUNTER — Ambulatory Visit: Payer: Self-pay | Admitting: Hospitalist

## 2006-04-22 ENCOUNTER — Ambulatory Visit: Payer: Self-pay | Admitting: Hospitalist

## 2006-04-23 IMAGING — CR DG CHEST 2V
2 series · 2 of 2 positions shown · non-contrast
Comparison: 10/25/03.

CLINICAL DATA: chest pain. 
 TWO VIEW CHEST

[view not recorded (1 of 2)]
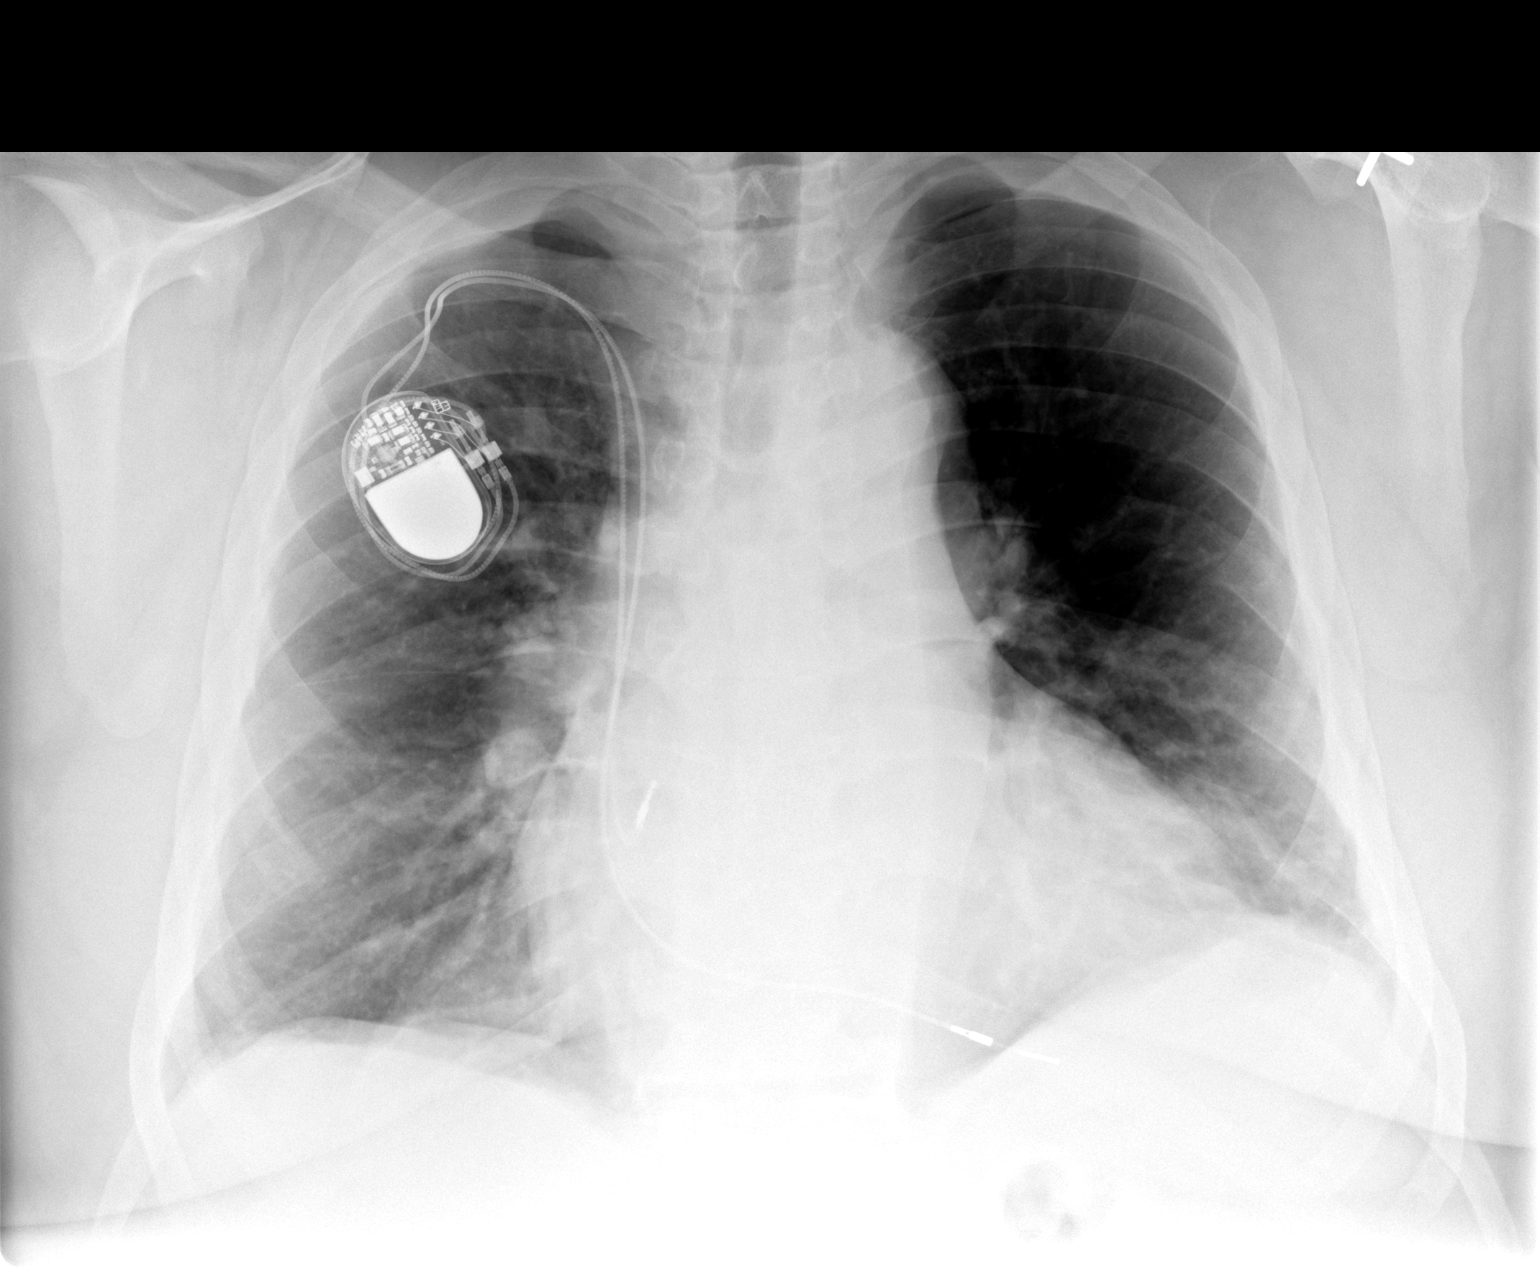

[view not recorded (2 of 2)]
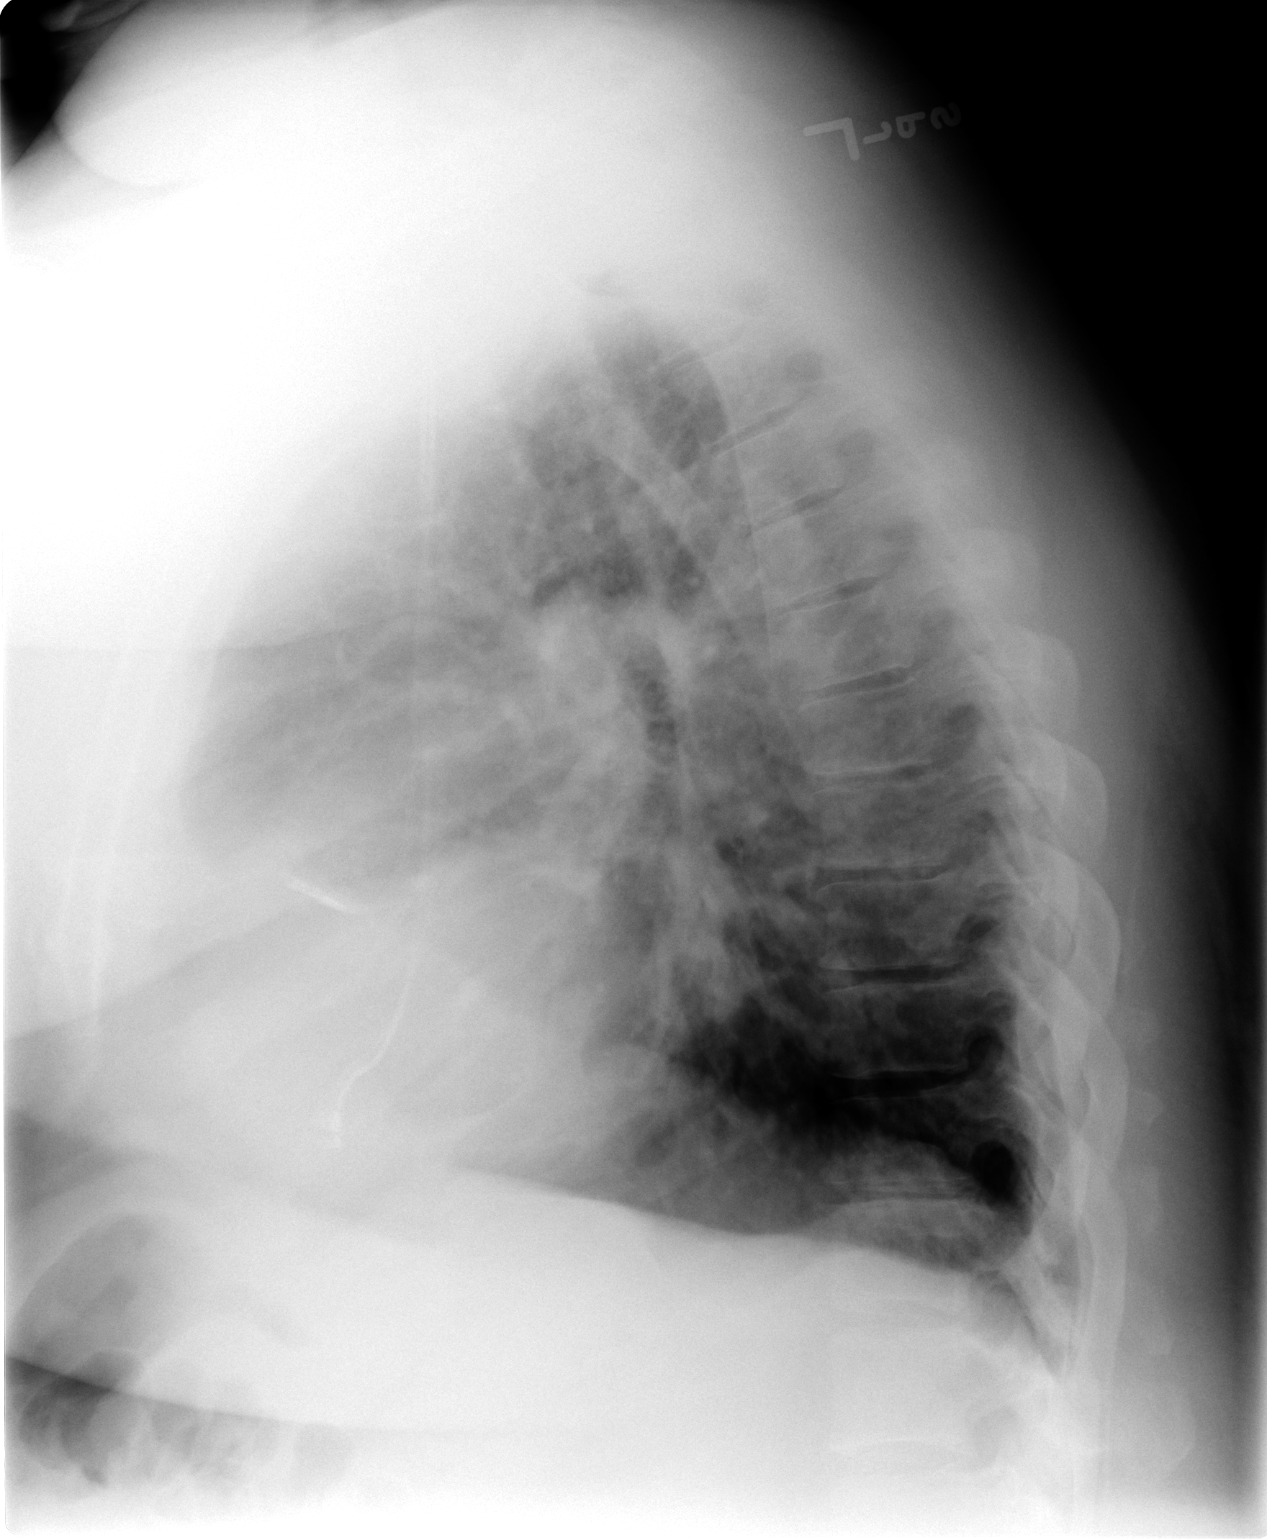

[2 of 2 positions shown; findings below may reference images not displayed]

There is cardiomegaly at present.  There are mildly accentuated bronchovascular markings ? unchanged.  There is a prominent pacemaker with right atrial and right ventricular leads which appear in satisfactory position radiographically.
 IMPRESSION
 Cardiomegaly.  No evidence for active chest disease.

## 2006-07-20 DIAGNOSIS — Z87898 Personal history of other specified conditions: Secondary | ICD-10-CM | POA: Insufficient documentation

## 2006-07-20 DIAGNOSIS — M109 Gout, unspecified: Secondary | ICD-10-CM | POA: Insufficient documentation

## 2006-07-20 DIAGNOSIS — Z794 Long term (current) use of insulin: Secondary | ICD-10-CM

## 2006-07-20 DIAGNOSIS — E0822 Diabetes mellitus due to underlying condition with diabetic chronic kidney disease: Secondary | ICD-10-CM | POA: Insufficient documentation

## 2006-07-20 DIAGNOSIS — E669 Obesity, unspecified: Secondary | ICD-10-CM | POA: Insufficient documentation

## 2006-07-20 DIAGNOSIS — H3530 Unspecified macular degeneration: Secondary | ICD-10-CM | POA: Insufficient documentation

## 2006-07-20 DIAGNOSIS — I10 Essential (primary) hypertension: Secondary | ICD-10-CM | POA: Insufficient documentation

## 2006-07-20 DIAGNOSIS — E7800 Pure hypercholesterolemia, unspecified: Secondary | ICD-10-CM | POA: Insufficient documentation

## 2006-07-20 DIAGNOSIS — N183 Chronic kidney disease, stage 3 (moderate): Secondary | ICD-10-CM

## 2006-07-27 ENCOUNTER — Ambulatory Visit: Payer: Self-pay | Admitting: *Deleted

## 2006-08-03 ENCOUNTER — Ambulatory Visit: Payer: Self-pay | Admitting: Internal Medicine

## 2006-08-29 ENCOUNTER — Ambulatory Visit (HOSPITAL_COMMUNITY): Admission: RE | Admit: 2006-08-29 | Discharge: 2006-08-29 | Payer: Self-pay | Admitting: Internal Medicine

## 2006-08-29 ENCOUNTER — Encounter (INDEPENDENT_AMBULATORY_CARE_PROVIDER_SITE_OTHER): Payer: Self-pay | Admitting: *Deleted

## 2006-08-29 ENCOUNTER — Encounter (INDEPENDENT_AMBULATORY_CARE_PROVIDER_SITE_OTHER): Payer: Self-pay | Admitting: Internal Medicine

## 2006-09-01 ENCOUNTER — Ambulatory Visit: Payer: Self-pay | Admitting: Internal Medicine

## 2006-11-18 ENCOUNTER — Encounter (INDEPENDENT_AMBULATORY_CARE_PROVIDER_SITE_OTHER): Payer: Self-pay | Admitting: *Deleted

## 2006-11-23 ENCOUNTER — Encounter (INDEPENDENT_AMBULATORY_CARE_PROVIDER_SITE_OTHER): Payer: Self-pay | Admitting: *Deleted

## 2006-11-23 ENCOUNTER — Ambulatory Visit: Payer: Self-pay | Admitting: *Deleted

## 2006-11-23 DIAGNOSIS — K921 Melena: Secondary | ICD-10-CM | POA: Insufficient documentation

## 2006-11-25 LAB — CONVERTED CEMR LAB
ALT: 26 units/L (ref 0–53)
AST: 14 units/L (ref 0–37)
Alkaline Phosphatase: 83 units/L (ref 39–117)
CO2: 27 meq/L (ref 19–32)
Creatinine, Ser: 1.14 mg/dL (ref 0.40–1.50)
HCT: 48 % (ref 39.0–52.0)
MCHC: 31.3 g/dL (ref 30.0–36.0)
MCV: 83.5 fL (ref 78.0–100.0)
Platelets: 156 10*3/uL (ref 150–400)
RDW: 14.8 % — ABNORMAL HIGH (ref 11.5–14.0)
Total Bilirubin: 0.4 mg/dL (ref 0.3–1.2)

## 2006-11-30 ENCOUNTER — Telehealth (INDEPENDENT_AMBULATORY_CARE_PROVIDER_SITE_OTHER): Payer: Self-pay | Admitting: *Deleted

## 2006-12-09 ENCOUNTER — Telehealth: Payer: Self-pay | Admitting: *Deleted

## 2006-12-14 ENCOUNTER — Encounter (INDEPENDENT_AMBULATORY_CARE_PROVIDER_SITE_OTHER): Payer: Self-pay | Admitting: *Deleted

## 2006-12-14 ENCOUNTER — Ambulatory Visit: Payer: Self-pay | Admitting: Internal Medicine

## 2006-12-14 LAB — CONVERTED CEMR LAB
Blood Glucose, Fingerstick: 68
HDL: 29 mg/dL — ABNORMAL LOW (ref >39)
Total CHOL/HDL Ratio: 5.1
VLDL: 16 mg/dL (ref 0–40)

## 2006-12-22 ENCOUNTER — Encounter (INDEPENDENT_AMBULATORY_CARE_PROVIDER_SITE_OTHER): Payer: Self-pay | Admitting: *Deleted

## 2007-01-04 ENCOUNTER — Ambulatory Visit: Payer: Self-pay | Admitting: Hospitalist

## 2007-01-04 LAB — CONVERTED CEMR LAB: Blood Glucose, Fingerstick: 119

## 2007-01-18 ENCOUNTER — Ambulatory Visit: Payer: Self-pay | Admitting: Internal Medicine

## 2007-01-19 ENCOUNTER — Telehealth (INDEPENDENT_AMBULATORY_CARE_PROVIDER_SITE_OTHER): Payer: Self-pay | Admitting: *Deleted

## 2007-01-26 IMAGING — CR DG CHEST 2V
2 series · 2 of 2 positions shown · non-contrast
Comparison: none

CLINICAL DATA: Short breath

[view not recorded (1 of 2)]
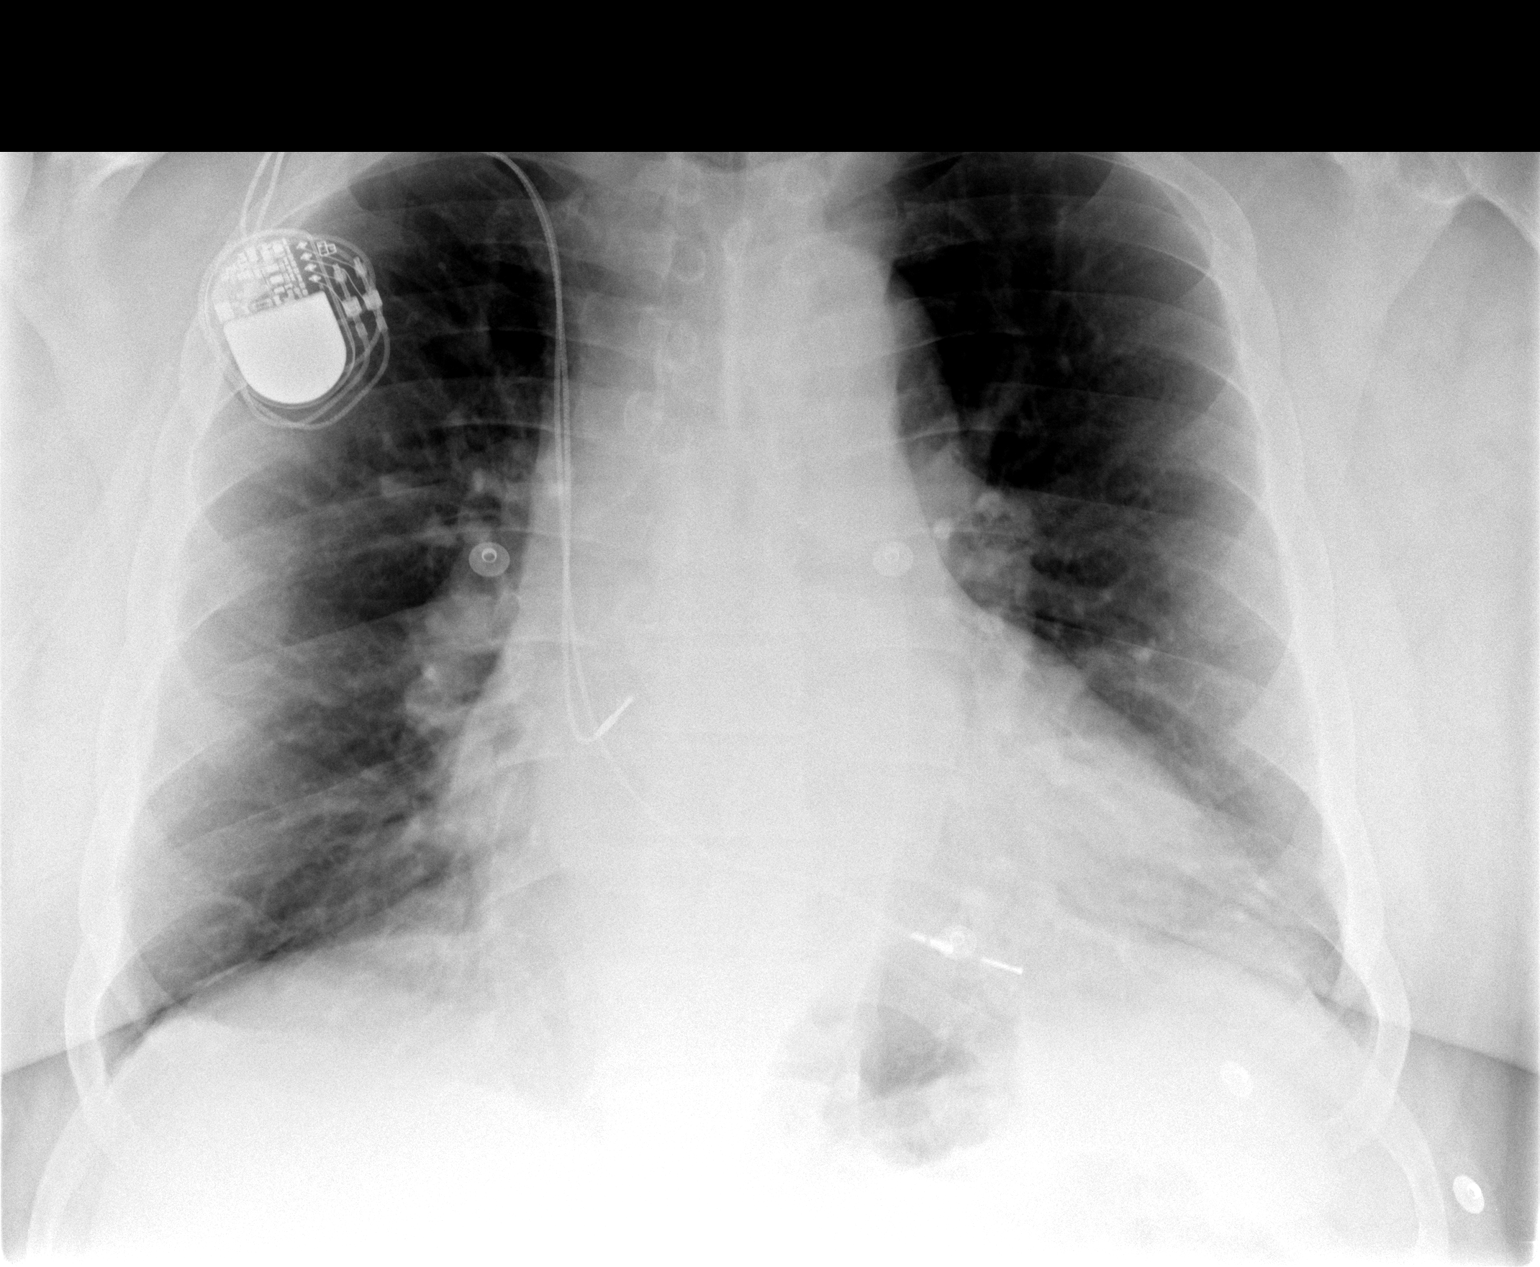

[view not recorded (2 of 2)]
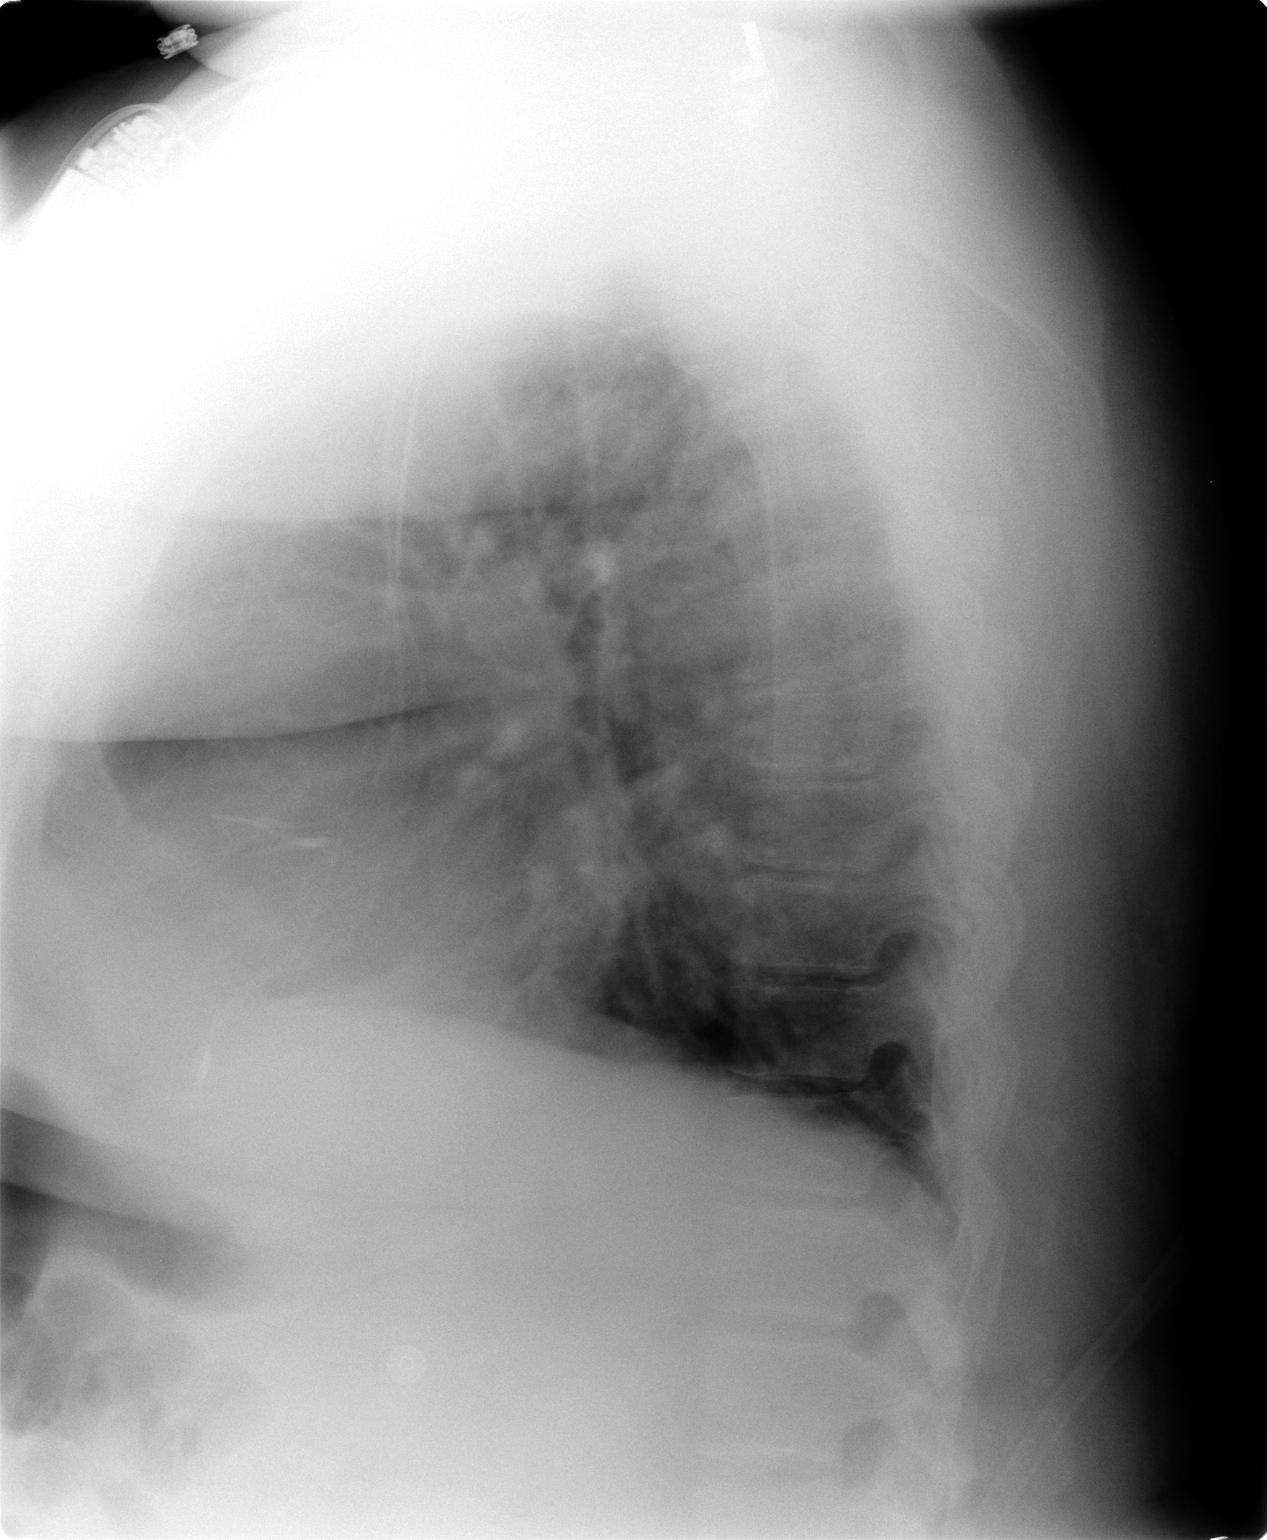

[2 of 2 positions shown; findings below may reference images not displayed]

Chest 2 view:

Comparison 01/10/2004. Right subclavian pacemaker stable position. Mild
enlargement of cardiac silhouette  as before. Patchy left retrocardiac
subsegmental atelectasis or infiltrate, new since previous exam. Right lung
remains clear. No effusion. Visualized bones unremarkable. Tortuous thoracic
aorta.
IMPRESSION: 1. Mild patchy subsegmental atelectasis versus early infiltrate in the posterior
left lower lobe.
2. Stable cardiomegaly

## 2007-02-07 ENCOUNTER — Telehealth (INDEPENDENT_AMBULATORY_CARE_PROVIDER_SITE_OTHER): Payer: Self-pay | Admitting: *Deleted

## 2007-02-14 IMAGING — CR DG CHEST 2V
2 series · 2 of 2 positions shown · non-contrast
Comparison: none

CLINICAL DATA: Short of breath

[w chest pa]
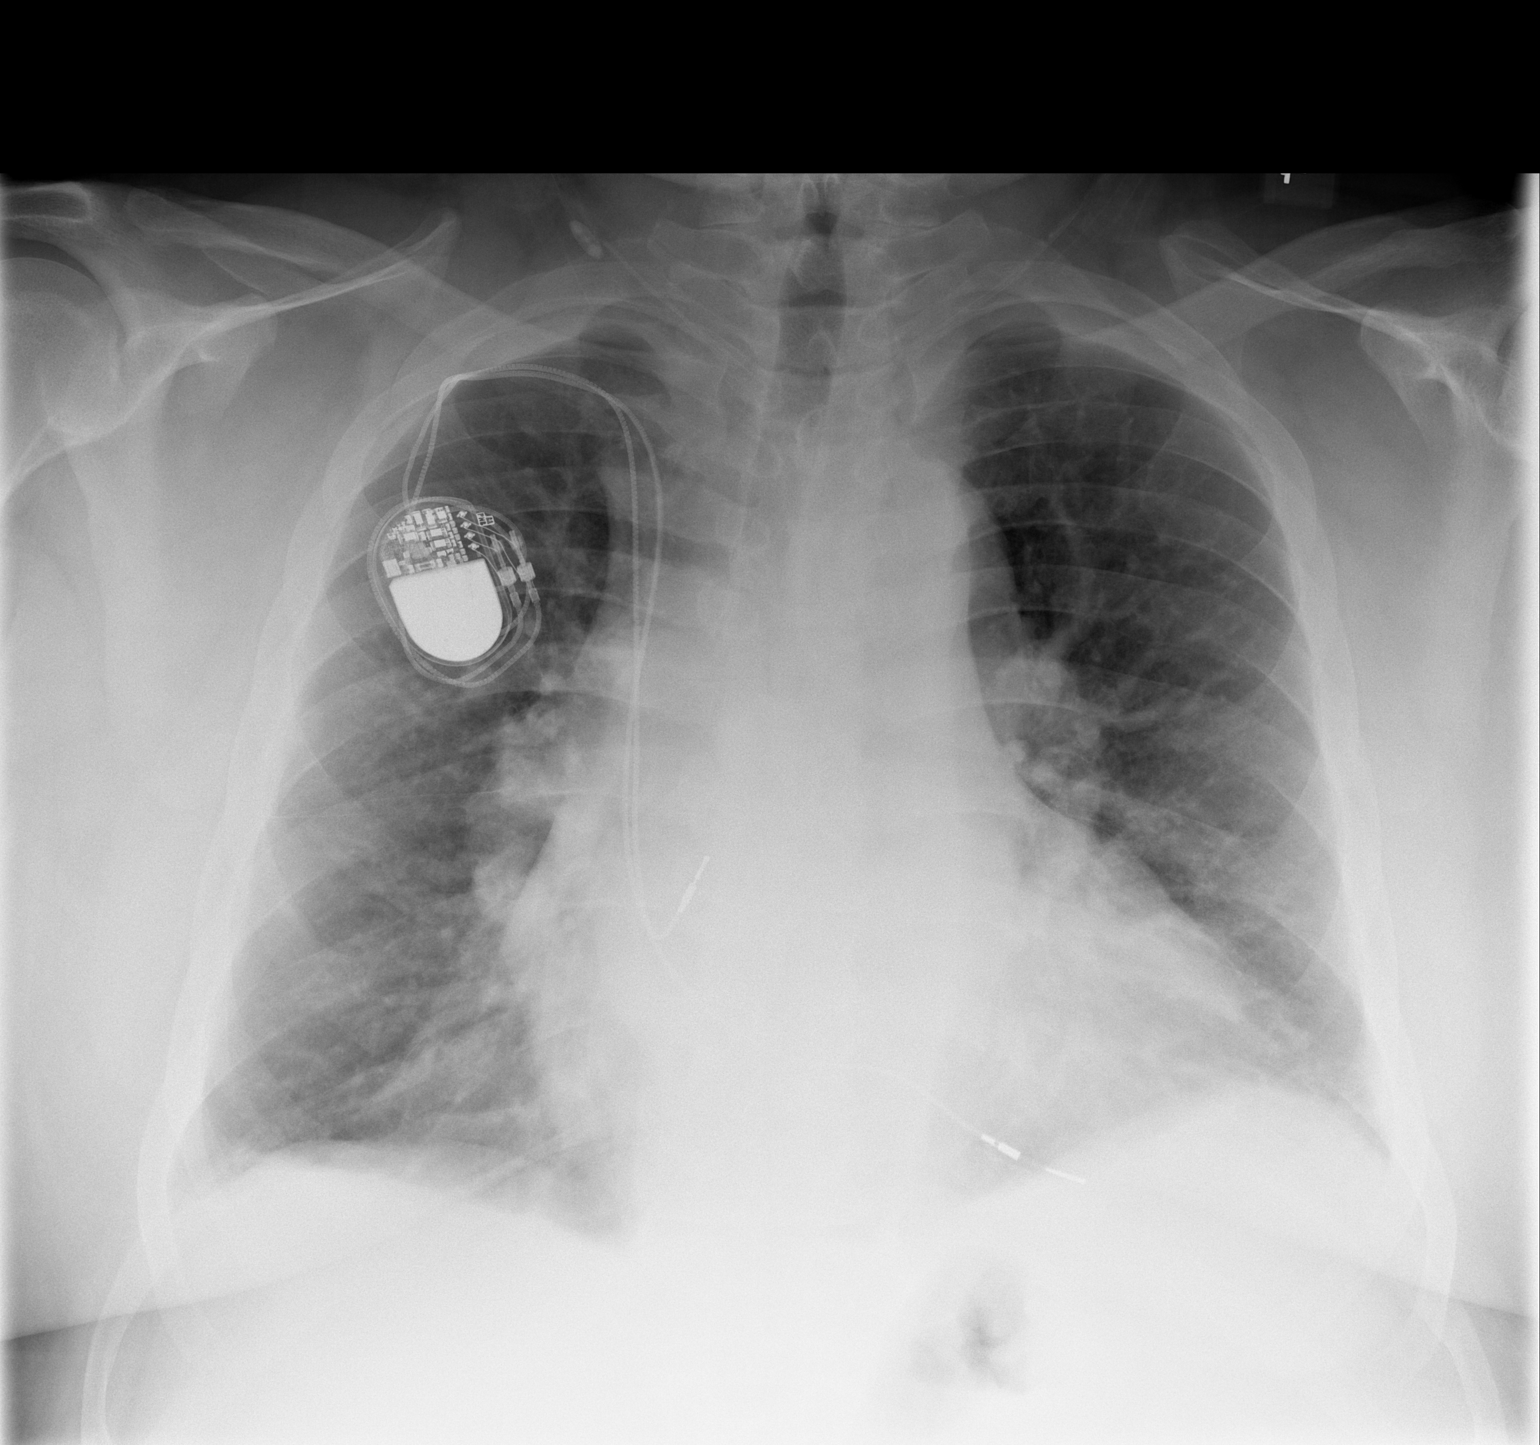

[w chest lat]
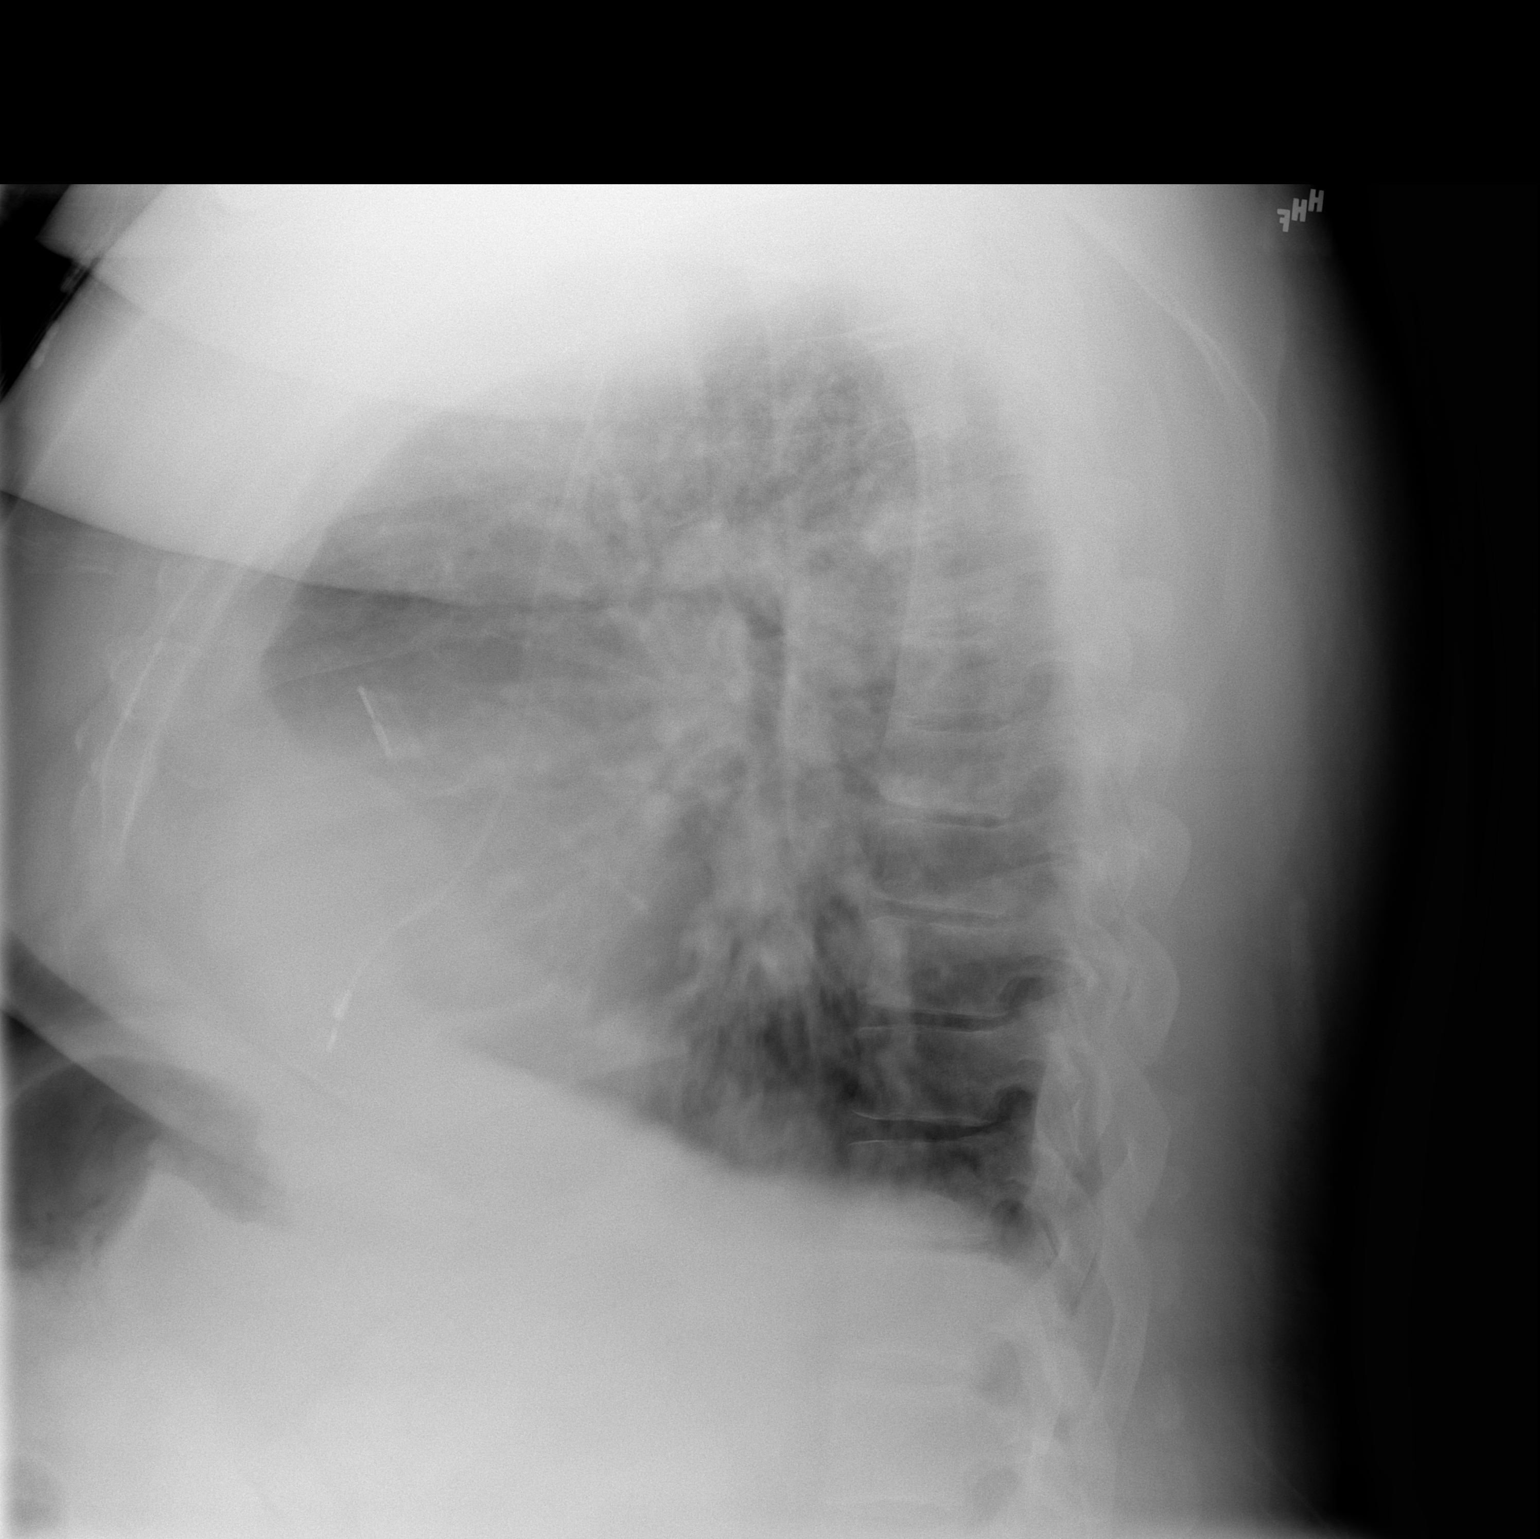

[2 of 2 positions shown; findings below may reference images not displayed]

Chest 2 view:

Comparison 10/15/2004. Right subclavian pacemaker stable in position. Moderate
enlargement of cardiac silhouette. Mild central pulmonary vascular congestion.
Patchy subsegmental atelectasis in the lung bases has slightly improved since
previous exam. No effusion. Visualized bones unremarkable.
IMPRESSION: 1. Improved aeration at the lung bases with persistent cardiomegaly and
pulmonary vascular congestion.

## 2007-04-05 ENCOUNTER — Telehealth: Payer: Self-pay | Admitting: *Deleted

## 2007-05-22 ENCOUNTER — Ambulatory Visit: Payer: Self-pay | Admitting: *Deleted

## 2007-05-22 ENCOUNTER — Telehealth: Payer: Self-pay | Admitting: *Deleted

## 2007-05-22 ENCOUNTER — Encounter (INDEPENDENT_AMBULATORY_CARE_PROVIDER_SITE_OTHER): Payer: Self-pay | Admitting: Internal Medicine

## 2007-05-22 LAB — CONVERTED CEMR LAB
AST: 16 units/L (ref 0–37)
BUN: 18 mg/dL (ref 6–23)
Calcium: 9.3 mg/dL (ref 8.4–10.5)
Chloride: 98 meq/L (ref 96–112)
Creatinine, Ser: 1.25 mg/dL (ref 0.40–1.50)

## 2007-06-20 ENCOUNTER — Telehealth: Payer: Self-pay | Admitting: *Deleted

## 2007-07-07 ENCOUNTER — Encounter (INDEPENDENT_AMBULATORY_CARE_PROVIDER_SITE_OTHER): Payer: Self-pay | Admitting: Internal Medicine

## 2007-08-19 IMAGING — CR DG KNEE 1-2V*R*
2 series · 2 of 2 positions shown · non-contrast
Comparison: none

CLINICAL DATA: pain

RIGHT KNEE ? 2 VIEW:

[t knee ap right]
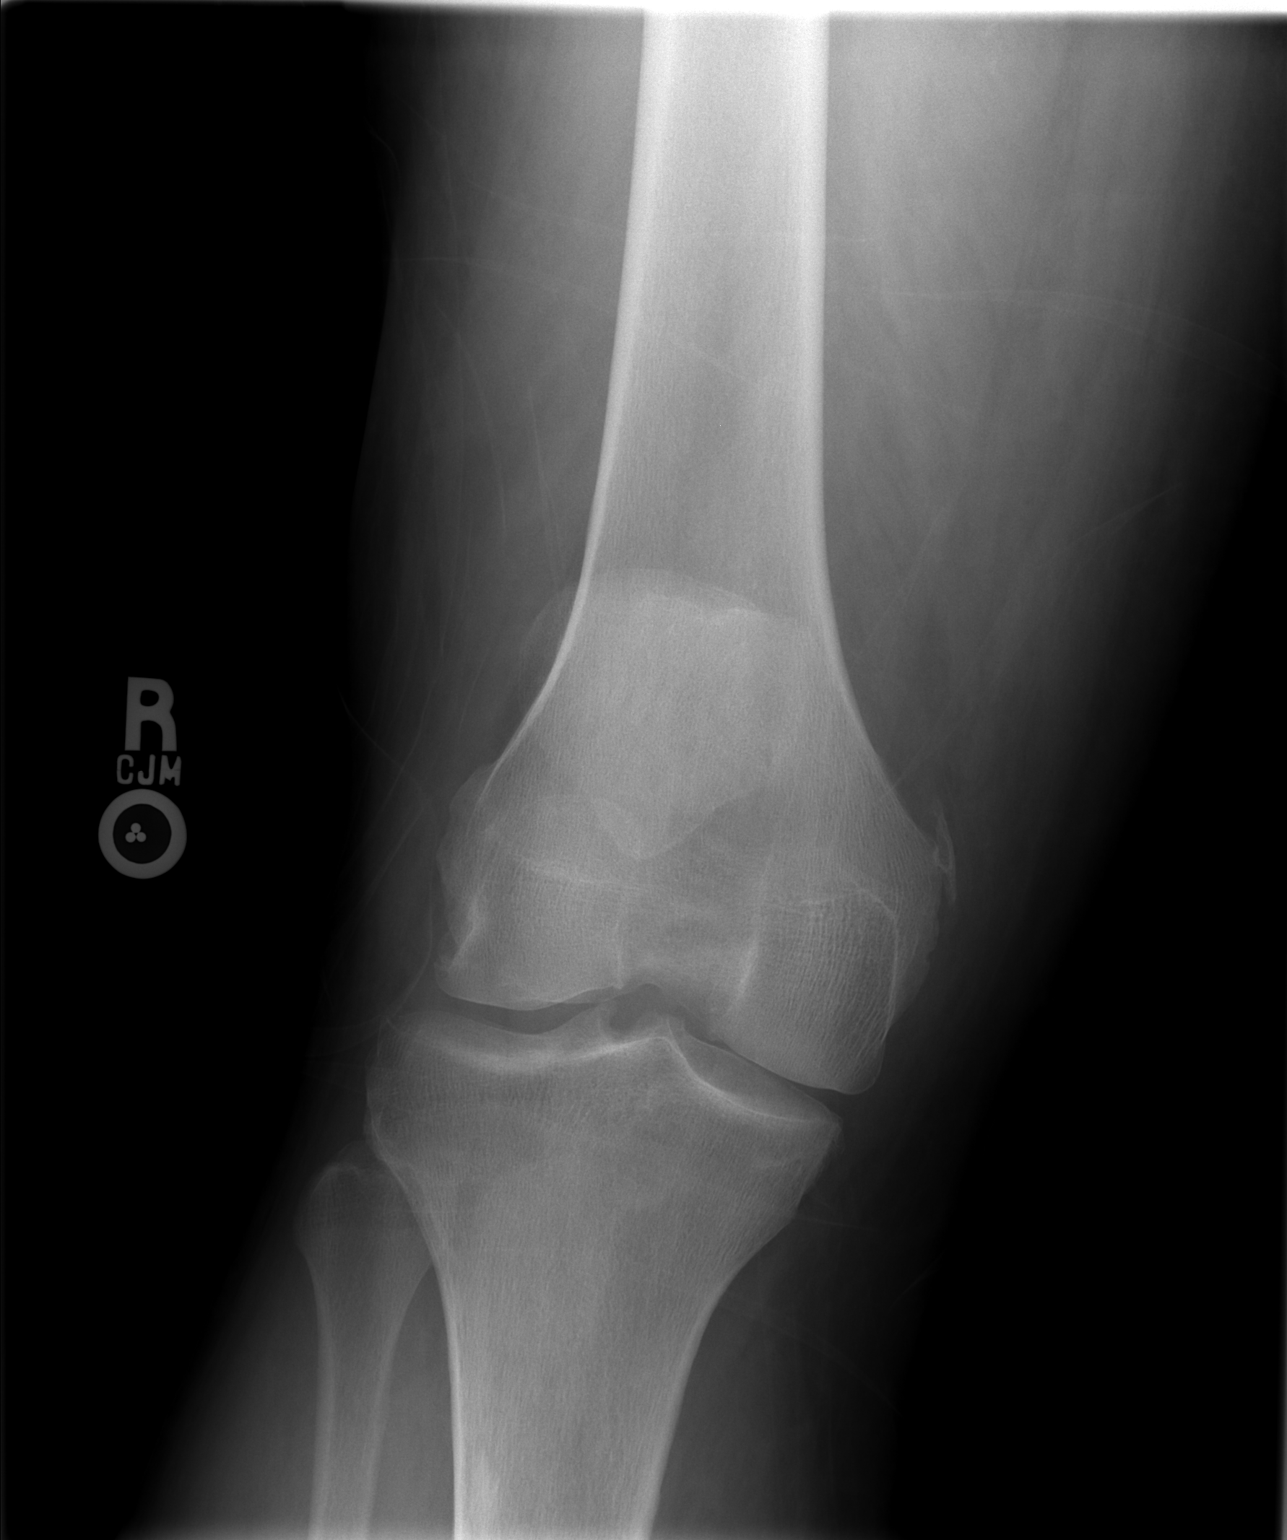

[t knee lat right]
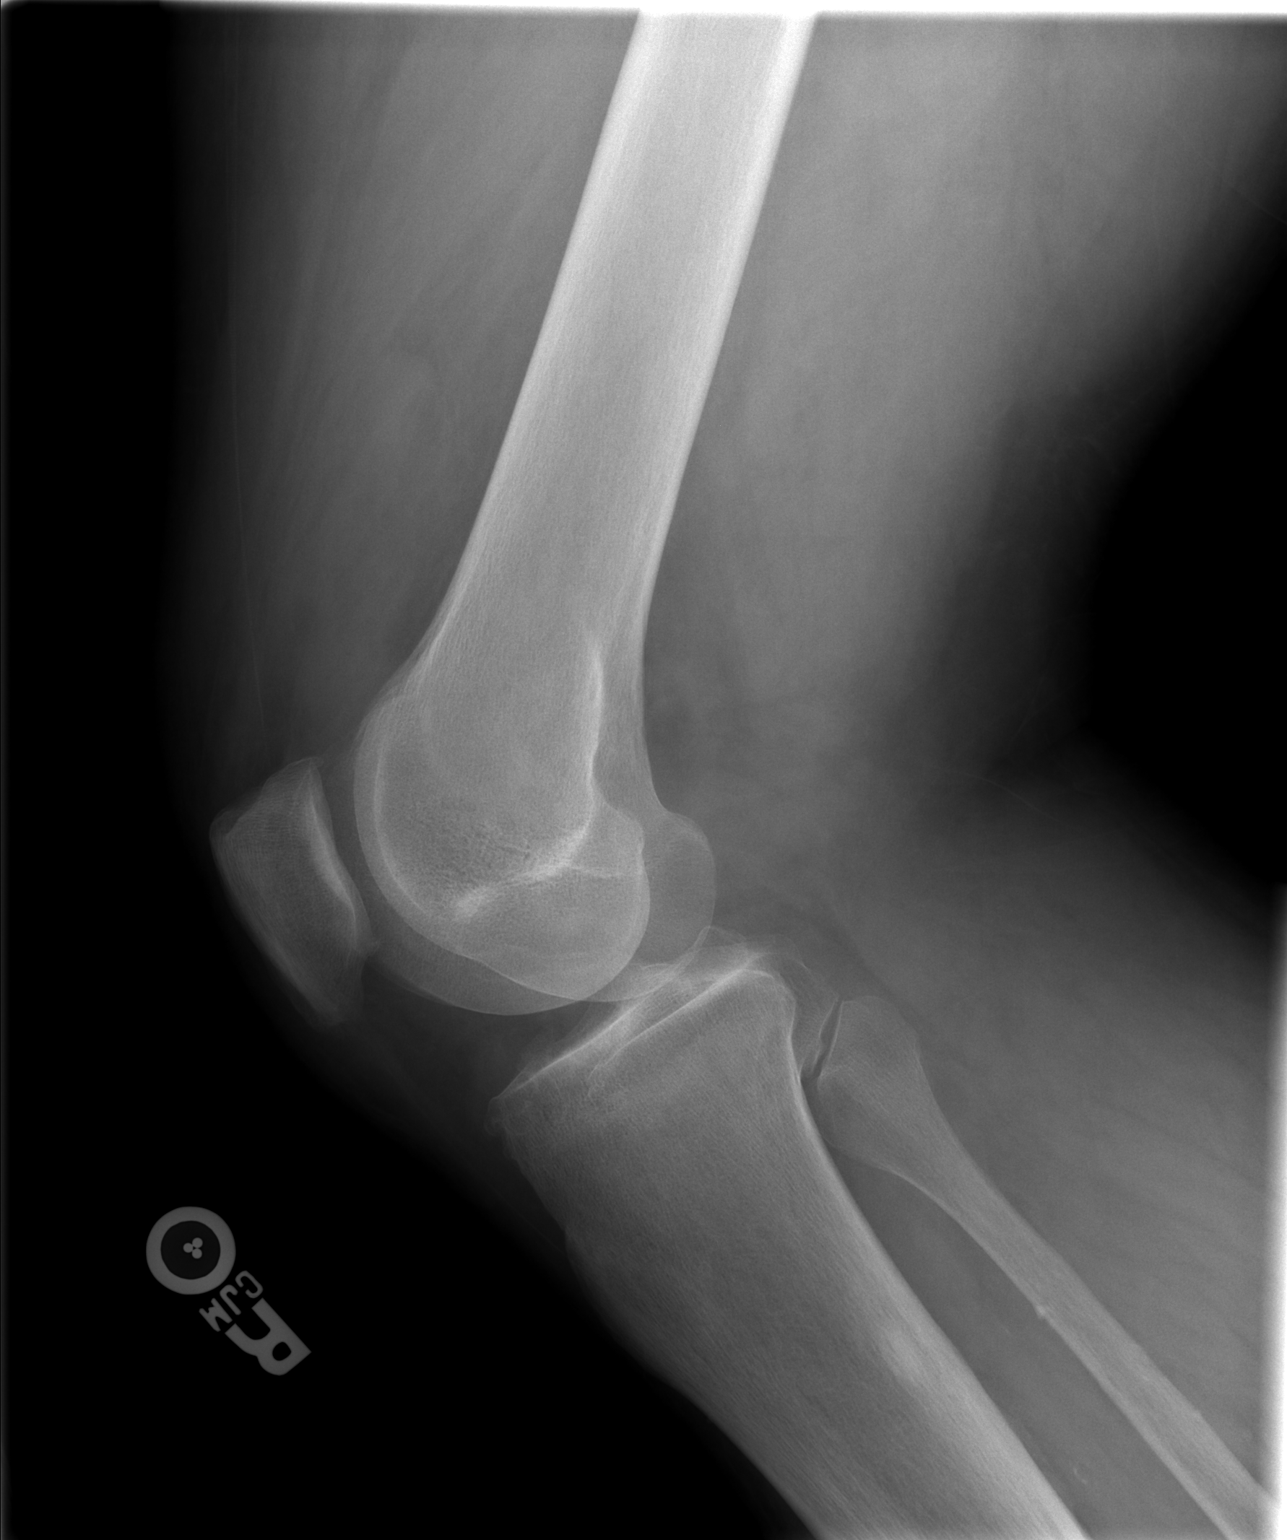

[2 of 2 positions shown; findings below may reference images not displayed]

FINDINGS: Large effusion.  No fracture.  Medial soft tissue calcification adjacent to the medial femoral condyle.  Patella appears intact.
IMPRESSION: Large suprapatellar bursa effusion without obvious fracture.

## 2007-08-22 ENCOUNTER — Telehealth: Payer: Self-pay | Admitting: *Deleted

## 2007-08-29 ENCOUNTER — Encounter (INDEPENDENT_AMBULATORY_CARE_PROVIDER_SITE_OTHER): Payer: Self-pay | Admitting: Internal Medicine

## 2007-10-05 ENCOUNTER — Encounter: Payer: Self-pay | Admitting: Internal Medicine

## 2007-10-05 ENCOUNTER — Ambulatory Visit: Payer: Self-pay | Admitting: Internal Medicine

## 2007-10-05 ENCOUNTER — Encounter (INDEPENDENT_AMBULATORY_CARE_PROVIDER_SITE_OTHER): Payer: Self-pay | Admitting: Internal Medicine

## 2007-10-05 LAB — CONVERTED CEMR LAB
Glucose, Bld: 460 mg/dL — ABNORMAL HIGH (ref 70–99)
Hgb A1c MFr Bld: 9.8 %
Potassium: 3.6 meq/L (ref 3.5–5.3)
Sodium: 135 meq/L (ref 135–145)

## 2007-10-12 ENCOUNTER — Telehealth (INDEPENDENT_AMBULATORY_CARE_PROVIDER_SITE_OTHER): Payer: Self-pay | Admitting: *Deleted

## 2007-10-16 ENCOUNTER — Telehealth: Payer: Self-pay | Admitting: Infectious Diseases

## 2007-10-19 ENCOUNTER — Telehealth: Payer: Self-pay | Admitting: *Deleted

## 2007-10-19 ENCOUNTER — Telehealth (INDEPENDENT_AMBULATORY_CARE_PROVIDER_SITE_OTHER): Payer: Self-pay | Admitting: *Deleted

## 2007-11-07 IMAGING — CR DG CHEST 2V
2 series · 2 of 2 positions shown · non-contrast
Comparison: 11/02/04.

CLINICAL DATA: Short of breath.  Preop.
 CHEST - 2 VIEW:

[w chest pa]
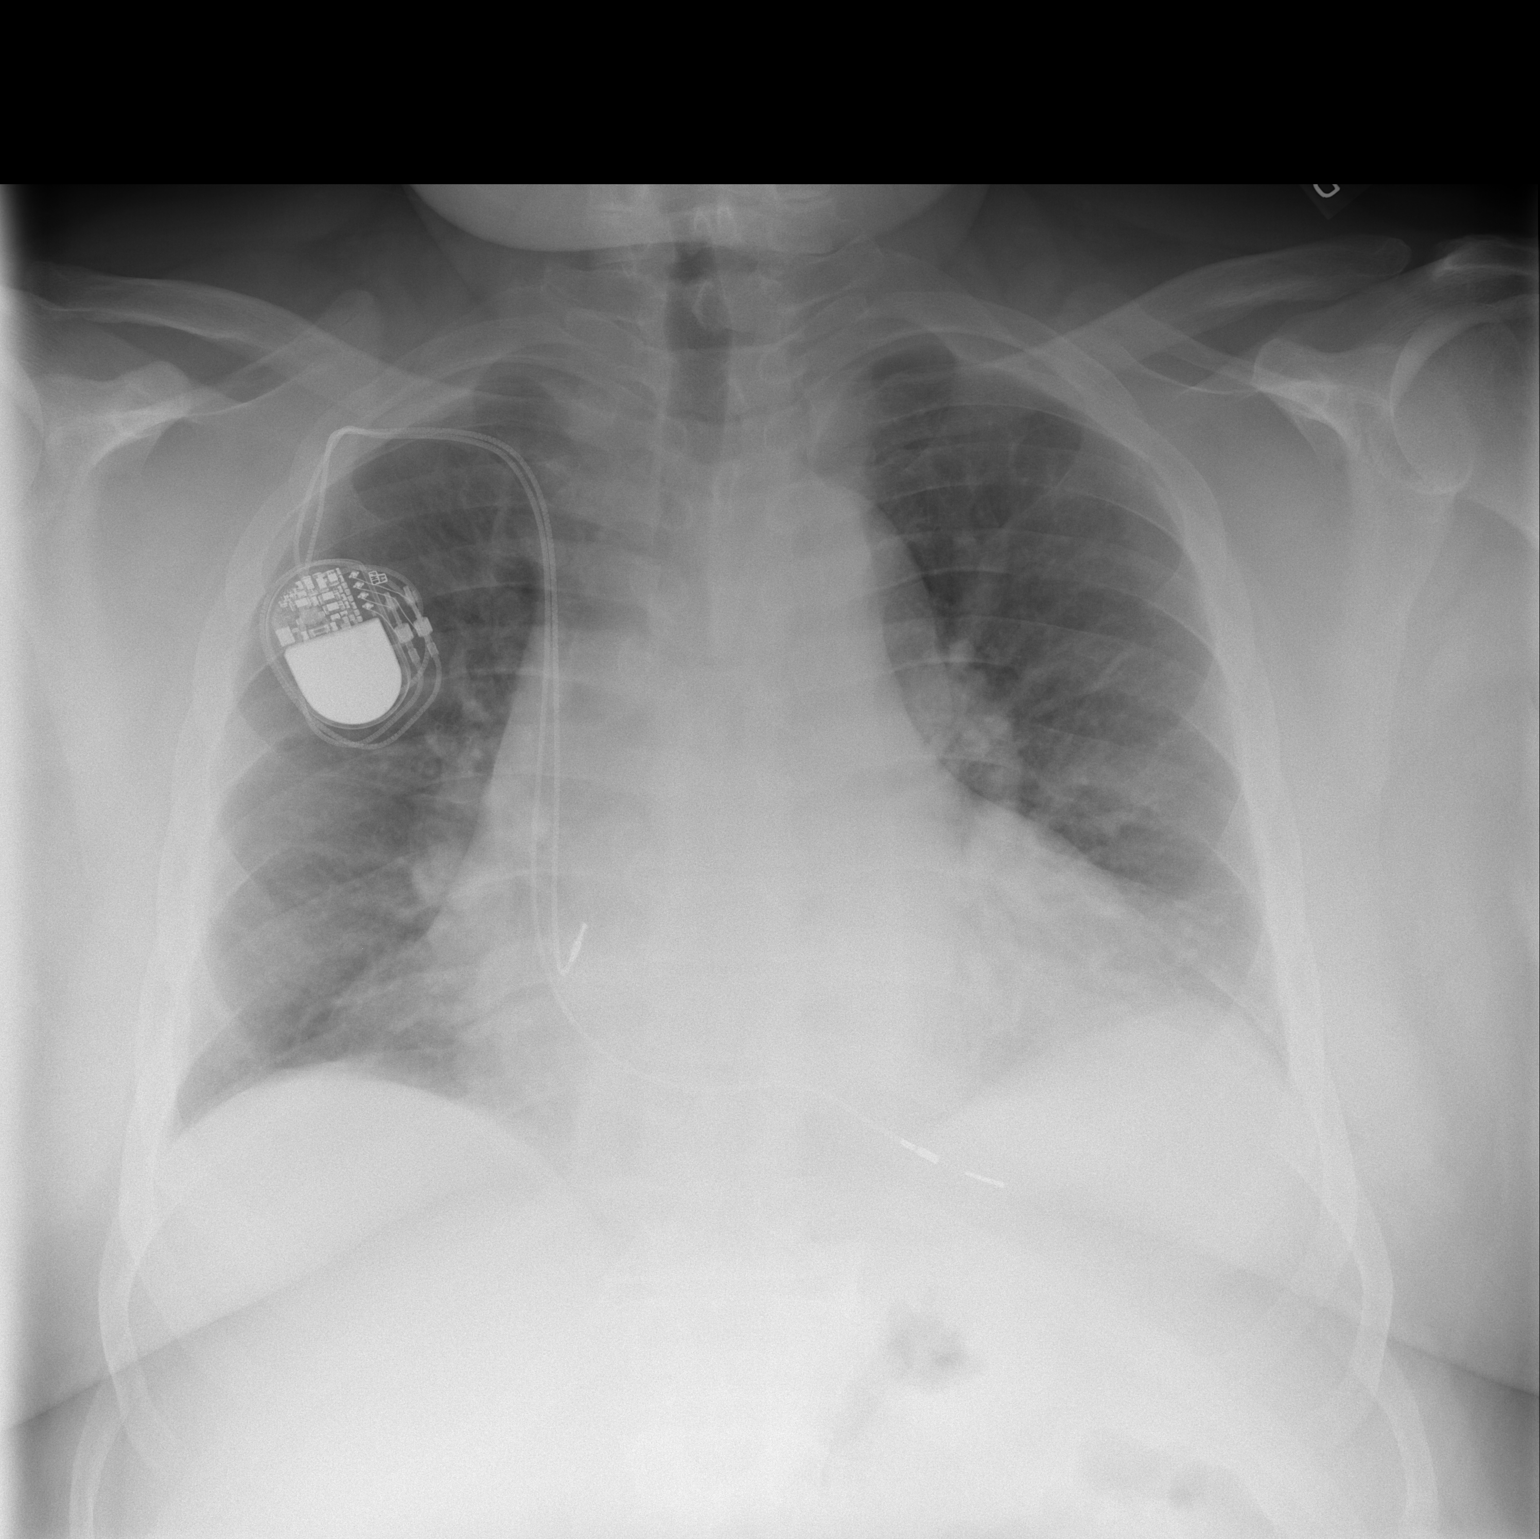

[w chest lat]
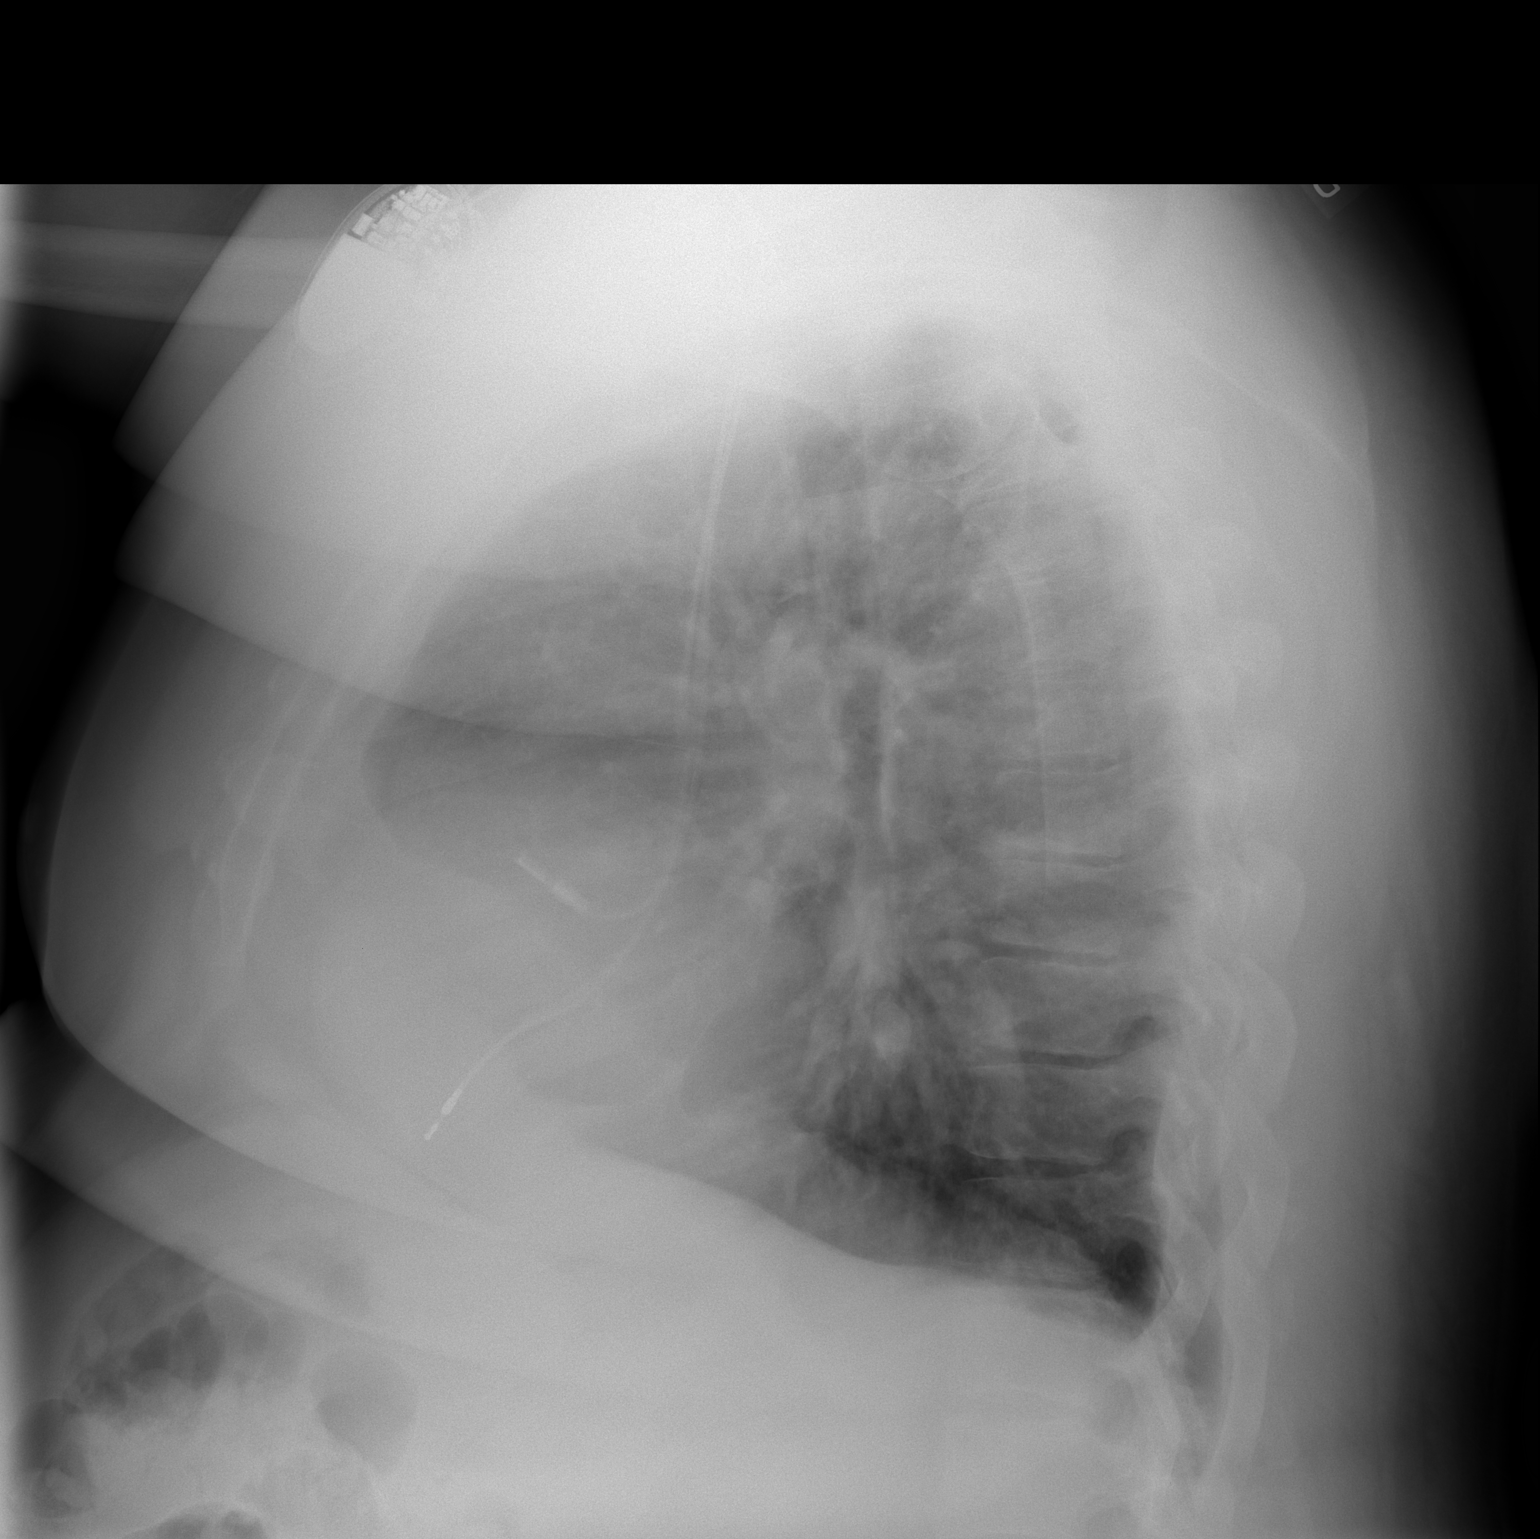

[2 of 2 positions shown; findings below may reference images not displayed]

FINDINGS: The heart is enlarged and there is mild vascular congestion.  There is no edema or effusion and there is no infiltrate. There is a dual lead pacemaker in place unchanged.
IMPRESSION: Cardiac enlargement and vascular congestion.

## 2007-11-22 ENCOUNTER — Ambulatory Visit: Payer: Self-pay | Admitting: Internal Medicine

## 2007-12-14 ENCOUNTER — Telehealth (INDEPENDENT_AMBULATORY_CARE_PROVIDER_SITE_OTHER): Payer: Self-pay | Admitting: Internal Medicine

## 2007-12-28 ENCOUNTER — Encounter (INDEPENDENT_AMBULATORY_CARE_PROVIDER_SITE_OTHER): Payer: Self-pay | Admitting: Internal Medicine

## 2008-01-17 ENCOUNTER — Telehealth (INDEPENDENT_AMBULATORY_CARE_PROVIDER_SITE_OTHER): Payer: Self-pay | Admitting: Internal Medicine

## 2008-01-23 ENCOUNTER — Telehealth (INDEPENDENT_AMBULATORY_CARE_PROVIDER_SITE_OTHER): Payer: Self-pay | Admitting: Internal Medicine

## 2008-01-31 ENCOUNTER — Ambulatory Visit (HOSPITAL_COMMUNITY): Admission: RE | Admit: 2008-01-31 | Discharge: 2008-01-31 | Payer: Self-pay | Admitting: *Deleted

## 2008-01-31 ENCOUNTER — Encounter (INDEPENDENT_AMBULATORY_CARE_PROVIDER_SITE_OTHER): Payer: Self-pay | Admitting: Internal Medicine

## 2008-01-31 ENCOUNTER — Ambulatory Visit: Payer: Self-pay | Admitting: *Deleted

## 2008-01-31 LAB — CONVERTED CEMR LAB
Blood Glucose, Fingerstick: 59
Hgb A1c MFr Bld: 6.7 %

## 2008-02-01 LAB — CONVERTED CEMR LAB
ALT: 24 units/L (ref 0–53)
BUN: 21 mg/dL (ref 6–23)
CO2: 29 meq/L (ref 19–32)
Calcium: 9.8 mg/dL (ref 8.4–10.5)
Chloride: 100 meq/L (ref 96–112)
Creatinine, Ser: 1.26 mg/dL (ref 0.40–1.50)
Hemoglobin: 15 g/dL (ref 13.0–17.0)
Lymphocytes Relative: 31 % (ref 12–46)
Lymphs Abs: 2.8 10*3/uL (ref 0.7–4.0)
Microalb Creat Ratio: 68.3 mg/g — ABNORMAL HIGH (ref 0.0–30.0)
Microalb, Ur: 8.58 mg/dL — ABNORMAL HIGH (ref 0.00–1.89)
Monocytes Absolute: 0.9 10*3/uL (ref 0.1–1.0)
Monocytes Relative: 9 % (ref 3–12)
Neutro Abs: 5.4 10*3/uL (ref 1.7–7.7)
RBC: 5.81 M/uL (ref 4.22–5.81)

## 2008-02-07 ENCOUNTER — Encounter (INDEPENDENT_AMBULATORY_CARE_PROVIDER_SITE_OTHER): Payer: Self-pay | Admitting: Internal Medicine

## 2008-02-09 ENCOUNTER — Encounter (INDEPENDENT_AMBULATORY_CARE_PROVIDER_SITE_OTHER): Payer: Self-pay | Admitting: Internal Medicine

## 2008-02-20 ENCOUNTER — Telehealth (INDEPENDENT_AMBULATORY_CARE_PROVIDER_SITE_OTHER): Payer: Self-pay | Admitting: Internal Medicine

## 2008-02-21 ENCOUNTER — Ambulatory Visit: Payer: Self-pay | Admitting: *Deleted

## 2008-03-06 ENCOUNTER — Encounter (INDEPENDENT_AMBULATORY_CARE_PROVIDER_SITE_OTHER): Payer: Self-pay | Admitting: Internal Medicine

## 2008-03-06 ENCOUNTER — Ambulatory Visit: Payer: Self-pay | Admitting: Infectious Diseases

## 2008-03-06 LAB — CONVERTED CEMR LAB
BUN: 19 mg/dL (ref 6–23)
Cholesterol, target level: 200 mg/dL
Cholesterol: 159 mg/dL (ref 0–200)
Glucose, Bld: 103 mg/dL — ABNORMAL HIGH (ref 70–99)
Potassium: 3.9 meq/L (ref 3.5–5.3)
VLDL: 23 mg/dL (ref 0–40)

## 2008-03-08 IMAGING — CR DG CHEST 2V
2 series · 2 of 2 positions shown · non-contrast
Comparison: [DATE] and 07/26/2005 studies.

CLINICAL DATA: Shortness of breath.  Hypertension.  
 CHEST - 2 VIEW:

[w chest pa]
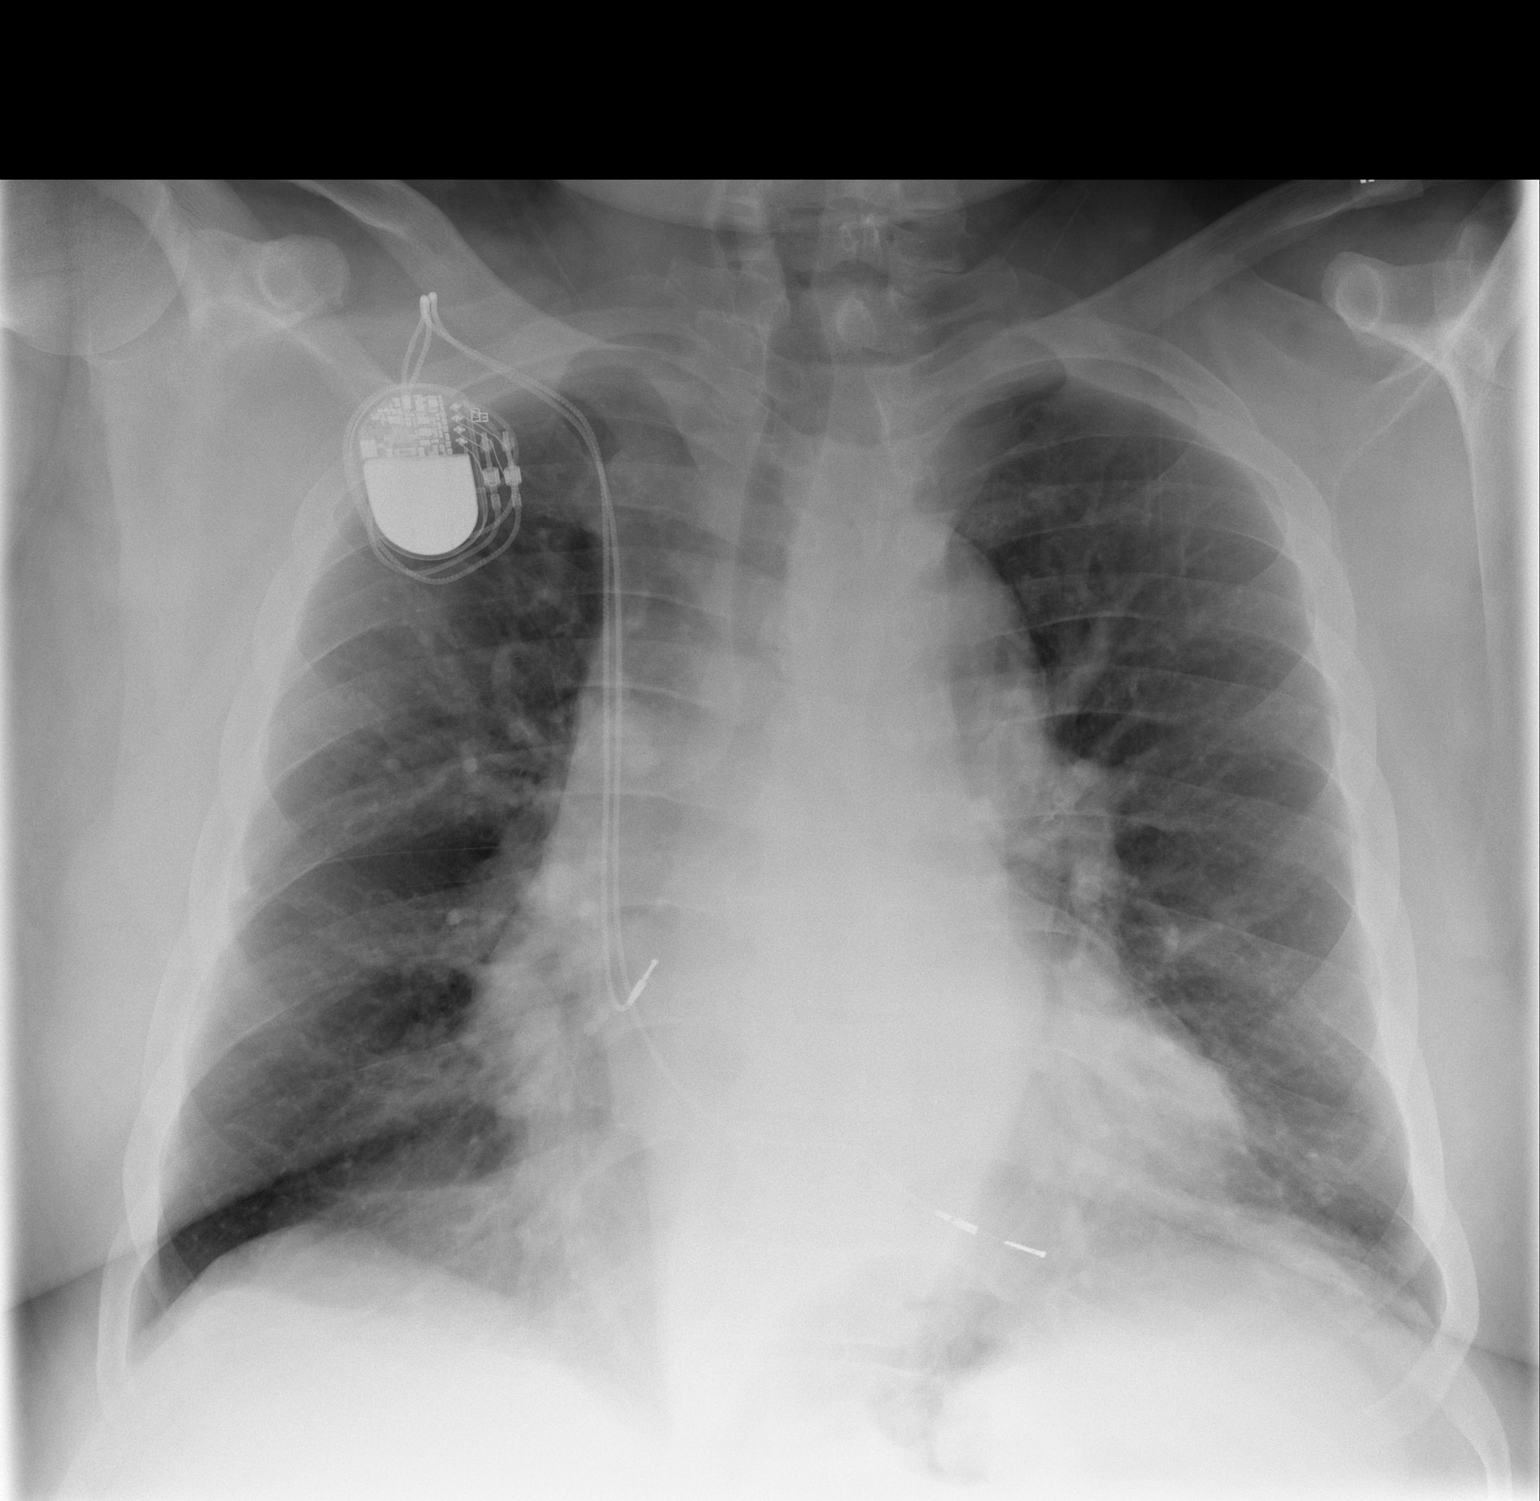

[w chest lat]
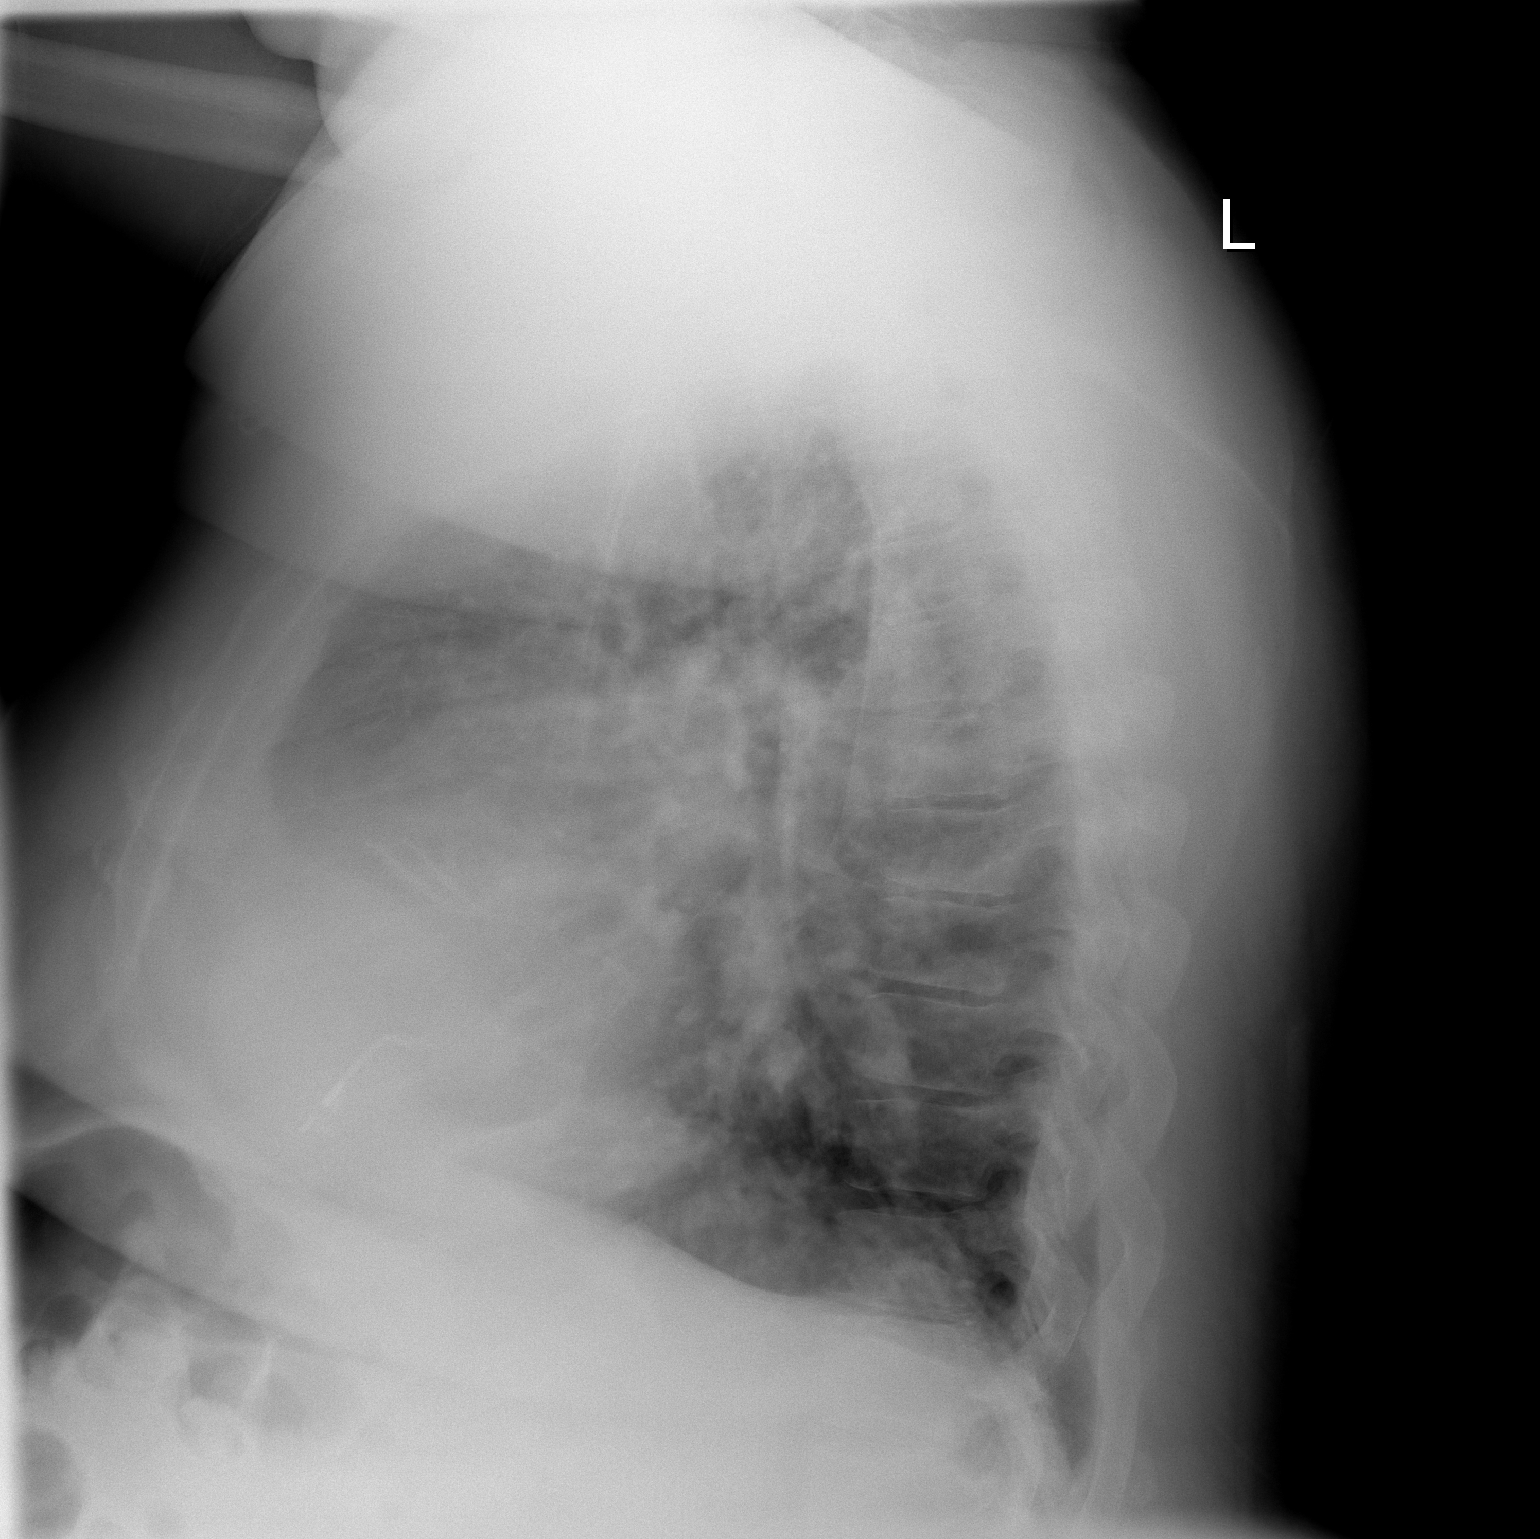

[2 of 2 positions shown; findings below may reference images not displayed]

There is stable cardiomegaly.  There are mildly accentuated bronchovascular markings which are unchanged when compared with the prior studies consistent with mild chronic vascular congestion.  There are no focal infiltrates.  There is a permanent pacemaker with right atrial and right ventricular leads which appear in satisfactory position.
IMPRESSION: Cardiomegaly with mild chronic vascular congestion.  No acute findings.

## 2008-05-15 ENCOUNTER — Telehealth: Payer: Self-pay | Admitting: *Deleted

## 2008-05-15 ENCOUNTER — Telehealth (INDEPENDENT_AMBULATORY_CARE_PROVIDER_SITE_OTHER): Payer: Self-pay | Admitting: Internal Medicine

## 2008-06-04 ENCOUNTER — Telehealth: Payer: Self-pay | Admitting: *Deleted

## 2008-06-04 ENCOUNTER — Emergency Department (HOSPITAL_COMMUNITY): Admission: EM | Admit: 2008-06-04 | Discharge: 2008-06-04 | Payer: Self-pay | Admitting: Emergency Medicine

## 2008-06-17 ENCOUNTER — Telehealth (INDEPENDENT_AMBULATORY_CARE_PROVIDER_SITE_OTHER): Payer: Self-pay | Admitting: Internal Medicine

## 2008-07-16 ENCOUNTER — Encounter (INDEPENDENT_AMBULATORY_CARE_PROVIDER_SITE_OTHER): Payer: Self-pay | Admitting: Internal Medicine

## 2008-08-04 ENCOUNTER — Emergency Department (HOSPITAL_COMMUNITY): Admission: EM | Admit: 2008-08-04 | Discharge: 2008-08-04 | Payer: Self-pay | Admitting: Emergency Medicine

## 2008-08-13 ENCOUNTER — Encounter: Payer: Self-pay | Admitting: Internal Medicine

## 2008-08-13 ENCOUNTER — Emergency Department (HOSPITAL_COMMUNITY): Admission: EM | Admit: 2008-08-13 | Discharge: 2008-08-13 | Payer: Self-pay | Admitting: Emergency Medicine

## 2008-08-20 ENCOUNTER — Telehealth: Payer: Self-pay | Admitting: Internal Medicine

## 2008-08-21 ENCOUNTER — Encounter (INDEPENDENT_AMBULATORY_CARE_PROVIDER_SITE_OTHER): Payer: Self-pay | Admitting: Internal Medicine

## 2008-08-21 ENCOUNTER — Ambulatory Visit: Payer: Self-pay | Admitting: Internal Medicine

## 2008-08-21 ENCOUNTER — Inpatient Hospital Stay (HOSPITAL_COMMUNITY): Admission: AD | Admit: 2008-08-21 | Discharge: 2008-08-24 | Payer: Self-pay | Admitting: Internal Medicine

## 2008-08-21 DIAGNOSIS — E1169 Type 2 diabetes mellitus with other specified complication: Secondary | ICD-10-CM | POA: Insufficient documentation

## 2008-08-21 LAB — CONVERTED CEMR LAB: Blood Glucose, Fingerstick: 334

## 2008-08-26 ENCOUNTER — Telehealth (INDEPENDENT_AMBULATORY_CARE_PROVIDER_SITE_OTHER): Payer: Self-pay | Admitting: Internal Medicine

## 2008-09-05 ENCOUNTER — Ambulatory Visit: Payer: Self-pay | Admitting: Infectious Disease

## 2008-09-05 ENCOUNTER — Encounter (INDEPENDENT_AMBULATORY_CARE_PROVIDER_SITE_OTHER): Payer: Self-pay | Admitting: Internal Medicine

## 2008-09-18 ENCOUNTER — Telehealth (INDEPENDENT_AMBULATORY_CARE_PROVIDER_SITE_OTHER): Payer: Self-pay | Admitting: Internal Medicine

## 2008-09-19 ENCOUNTER — Encounter (INDEPENDENT_AMBULATORY_CARE_PROVIDER_SITE_OTHER): Payer: Self-pay | Admitting: Internal Medicine

## 2008-09-24 ENCOUNTER — Encounter: Payer: Self-pay | Admitting: Internal Medicine

## 2008-09-24 ENCOUNTER — Ambulatory Visit: Payer: Self-pay | Admitting: Internal Medicine

## 2008-09-24 LAB — CONVERTED CEMR LAB
ALT: 17 units/L (ref 0–53)
AST: 15 units/L (ref 0–37)
Alkaline Phosphatase: 88 units/L (ref 39–117)
Blood Glucose, Fingerstick: 169
CO2: 29 meq/L (ref 19–32)
Cholesterol: 161 mg/dL (ref 0–200)
LDL Cholesterol: 106 mg/dL — ABNORMAL HIGH (ref 0–99)
Sodium: 140 meq/L (ref 135–145)
Total Bilirubin: 0.4 mg/dL (ref 0.3–1.2)
Total Protein: 7.7 g/dL (ref 6.0–8.3)
VLDL: 20 mg/dL (ref 0–40)

## 2008-10-15 ENCOUNTER — Telehealth (INDEPENDENT_AMBULATORY_CARE_PROVIDER_SITE_OTHER): Payer: Self-pay | Admitting: Internal Medicine

## 2008-10-18 ENCOUNTER — Telehealth (INDEPENDENT_AMBULATORY_CARE_PROVIDER_SITE_OTHER): Payer: Self-pay | Admitting: Internal Medicine

## 2008-10-22 ENCOUNTER — Telehealth (INDEPENDENT_AMBULATORY_CARE_PROVIDER_SITE_OTHER): Payer: Self-pay | Admitting: Internal Medicine

## 2008-10-25 ENCOUNTER — Encounter (INDEPENDENT_AMBULATORY_CARE_PROVIDER_SITE_OTHER): Payer: Self-pay | Admitting: Internal Medicine

## 2008-11-01 ENCOUNTER — Encounter: Payer: Self-pay | Admitting: Internal Medicine

## 2008-11-07 ENCOUNTER — Ambulatory Visit: Payer: Self-pay | Admitting: Internal Medicine

## 2008-11-07 ENCOUNTER — Encounter (INDEPENDENT_AMBULATORY_CARE_PROVIDER_SITE_OTHER): Payer: Self-pay | Admitting: Internal Medicine

## 2008-11-07 LAB — CONVERTED CEMR LAB
Blood Glucose, Fingerstick: 134
Hgb A1c MFr Bld: 8.6 %

## 2008-11-08 LAB — CONVERTED CEMR LAB
ALT: 27 units/L (ref 0–53)
AST: 19 units/L (ref 0–37)
Alkaline Phosphatase: 87 units/L (ref 39–117)
Creatinine, Ser: 1.17 mg/dL (ref 0.40–1.50)
HDL: 36 mg/dL — ABNORMAL LOW (ref >39)
LDL Cholesterol: 128 mg/dL — ABNORMAL HIGH (ref 0–99)
Sodium: 143 meq/L (ref 135–145)
Total Bilirubin: 0.5 mg/dL (ref 0.3–1.2)
Total CHOL/HDL Ratio: 5.2
Total Protein: 7.7 g/dL (ref 6.0–8.3)
Triglycerides: 117 mg/dL (ref <150)

## 2008-11-18 ENCOUNTER — Encounter: Payer: Self-pay | Admitting: Internal Medicine

## 2008-12-16 ENCOUNTER — Encounter (INDEPENDENT_AMBULATORY_CARE_PROVIDER_SITE_OTHER): Payer: Self-pay | Admitting: Internal Medicine

## 2008-12-16 ENCOUNTER — Ambulatory Visit: Payer: Self-pay | Admitting: Internal Medicine

## 2008-12-16 DIAGNOSIS — R32 Unspecified urinary incontinence: Secondary | ICD-10-CM | POA: Insufficient documentation

## 2008-12-16 LAB — CONVERTED CEMR LAB: Blood Glucose, Fingerstick: 288

## 2008-12-17 ENCOUNTER — Encounter (INDEPENDENT_AMBULATORY_CARE_PROVIDER_SITE_OTHER): Payer: Self-pay | Admitting: Internal Medicine

## 2008-12-18 ENCOUNTER — Telehealth (INDEPENDENT_AMBULATORY_CARE_PROVIDER_SITE_OTHER): Payer: Self-pay | Admitting: Internal Medicine

## 2008-12-18 LAB — CONVERTED CEMR LAB
Amphetamine Screen, Ur: NEGATIVE
BUN: 16 mg/dL (ref 6–23)
Benzodiazepines.: NEGATIVE
Bilirubin Urine: NEGATIVE
CO2: 27 meq/L (ref 19–32)
Calcium: 9.9 mg/dL (ref 8.4–10.5)
Creatinine, Urine: 115.3 mg/dL
GFR calc Af Amer: >60 mL/min (ref >60)
Glucose, Bld: 296 mg/dL — ABNORMAL HIGH (ref 70–99)
Hemoglobin, Urine: NEGATIVE
Marijuana Metabolite: NEGATIVE
Microalb Creat Ratio: 401.2 mg/g — ABNORMAL HIGH (ref 0.0–30.0)
Phencyclidine (PCP): NEGATIVE
Protein, ur: 100 mg/dL — AB
Urine Glucose: 500 mg/dL — AB

## 2008-12-20 ENCOUNTER — Ambulatory Visit: Payer: Self-pay | Admitting: Infectious Disease

## 2008-12-20 LAB — CONVERTED CEMR LAB: Blood Glucose, Fingerstick: 421

## 2008-12-27 ENCOUNTER — Encounter (INDEPENDENT_AMBULATORY_CARE_PROVIDER_SITE_OTHER): Payer: Self-pay | Admitting: Internal Medicine

## 2009-01-03 ENCOUNTER — Ambulatory Visit: Payer: Self-pay | Admitting: Infectious Disease

## 2009-01-03 ENCOUNTER — Encounter (INDEPENDENT_AMBULATORY_CARE_PROVIDER_SITE_OTHER): Payer: Self-pay | Admitting: Internal Medicine

## 2009-01-03 LAB — CONVERTED CEMR LAB
CO2: 25 meq/L (ref 19–32)
Chloride: 97 meq/L (ref 96–112)
Potassium: 4.2 meq/L (ref 3.5–5.3)
Sodium: 136 meq/L (ref 135–145)

## 2009-01-10 ENCOUNTER — Encounter (INDEPENDENT_AMBULATORY_CARE_PROVIDER_SITE_OTHER): Payer: Self-pay | Admitting: Internal Medicine

## 2009-01-10 ENCOUNTER — Telehealth (INDEPENDENT_AMBULATORY_CARE_PROVIDER_SITE_OTHER): Payer: Self-pay | Admitting: Internal Medicine

## 2009-01-20 ENCOUNTER — Telehealth (INDEPENDENT_AMBULATORY_CARE_PROVIDER_SITE_OTHER): Payer: Self-pay | Admitting: Internal Medicine

## 2009-01-28 ENCOUNTER — Encounter (INDEPENDENT_AMBULATORY_CARE_PROVIDER_SITE_OTHER): Payer: Self-pay | Admitting: Internal Medicine

## 2009-01-28 ENCOUNTER — Ambulatory Visit: Payer: Self-pay | Admitting: Internal Medicine

## 2009-01-28 LAB — CONVERTED CEMR LAB
Bilirubin Urine: NEGATIVE
Chlamydia, Swab/Urine, PCR: NEGATIVE
GC Probe Amp, Urine: NEGATIVE
Ketones, ur: NEGATIVE mg/dL
Nitrite: NEGATIVE
Protein, ur: NEGATIVE mg/dL
Specific Gravity, Urine: 1.02
Urine Glucose: 1000 mg/dL — AB
Urobilinogen, UA: 0.2
pH: 5

## 2009-01-29 ENCOUNTER — Encounter (INDEPENDENT_AMBULATORY_CARE_PROVIDER_SITE_OTHER): Payer: Self-pay | Admitting: Internal Medicine

## 2009-02-06 ENCOUNTER — Encounter (INDEPENDENT_AMBULATORY_CARE_PROVIDER_SITE_OTHER): Payer: Self-pay | Admitting: Internal Medicine

## 2009-03-06 ENCOUNTER — Telehealth (INDEPENDENT_AMBULATORY_CARE_PROVIDER_SITE_OTHER): Payer: Self-pay | Admitting: Internal Medicine

## 2009-03-07 ENCOUNTER — Ambulatory Visit: Payer: Self-pay | Admitting: Infectious Diseases

## 2009-03-07 ENCOUNTER — Encounter: Payer: Self-pay | Admitting: Internal Medicine

## 2009-03-13 LAB — CONVERTED CEMR LAB
Ketones, ur: NEGATIVE mg/dL
RBC / HPF: NONE SEEN (ref <3)
Specific Gravity, Urine: 1.019 (ref 1.005–1.030)
Urine Glucose: >1000 mg/dL — AB
WBC, UA: NONE SEEN cells/hpf (ref <3)
pH: 5.5 (ref 5.0–8.0)

## 2009-04-09 ENCOUNTER — Emergency Department (HOSPITAL_COMMUNITY): Admission: EM | Admit: 2009-04-09 | Discharge: 2009-04-10 | Payer: Self-pay | Admitting: Emergency Medicine

## 2009-04-10 ENCOUNTER — Telehealth (INDEPENDENT_AMBULATORY_CARE_PROVIDER_SITE_OTHER): Payer: Self-pay | Admitting: Internal Medicine

## 2009-05-15 ENCOUNTER — Ambulatory Visit: Payer: Self-pay | Admitting: Infectious Diseases

## 2009-05-16 LAB — CONVERTED CEMR LAB
Alkaline Phosphatase: 127 units/L — ABNORMAL HIGH (ref 39–117)
Cholesterol: 214 mg/dL — ABNORMAL HIGH (ref 0–200)
Creatinine, Ser: 1.25 mg/dL (ref 0.40–1.50)
Glucose, Bld: 389 mg/dL — ABNORMAL HIGH (ref 70–99)
HDL: 35 mg/dL — ABNORMAL LOW (ref >39)
LDL Cholesterol: 135 mg/dL — ABNORMAL HIGH (ref 0–99)
Sodium: 135 meq/L (ref 135–145)
Total Bilirubin: 0.5 mg/dL (ref 0.3–1.2)
Total CHOL/HDL Ratio: 6.1
Total Protein: 7.7 g/dL (ref 6.0–8.3)
Triglycerides: 222 mg/dL — ABNORMAL HIGH (ref <150)
VLDL: 44 mg/dL — ABNORMAL HIGH (ref 0–40)

## 2009-05-23 ENCOUNTER — Telehealth (INDEPENDENT_AMBULATORY_CARE_PROVIDER_SITE_OTHER): Payer: Self-pay | Admitting: Internal Medicine

## 2009-05-28 ENCOUNTER — Telehealth (INDEPENDENT_AMBULATORY_CARE_PROVIDER_SITE_OTHER): Payer: Self-pay | Admitting: Internal Medicine

## 2009-06-09 ENCOUNTER — Ambulatory Visit: Payer: Self-pay | Admitting: Internal Medicine

## 2009-06-10 ENCOUNTER — Telehealth (INDEPENDENT_AMBULATORY_CARE_PROVIDER_SITE_OTHER): Payer: Self-pay | Admitting: Internal Medicine

## 2009-06-16 ENCOUNTER — Ambulatory Visit (HOSPITAL_BASED_OUTPATIENT_CLINIC_OR_DEPARTMENT_OTHER): Admission: RE | Admit: 2009-06-16 | Discharge: 2009-06-16 | Payer: Self-pay | Admitting: Internal Medicine

## 2009-06-20 ENCOUNTER — Encounter (INDEPENDENT_AMBULATORY_CARE_PROVIDER_SITE_OTHER): Payer: Self-pay | Admitting: Internal Medicine

## 2009-06-21 ENCOUNTER — Encounter (INDEPENDENT_AMBULATORY_CARE_PROVIDER_SITE_OTHER): Payer: Self-pay | Admitting: Internal Medicine

## 2009-06-30 ENCOUNTER — Encounter (INDEPENDENT_AMBULATORY_CARE_PROVIDER_SITE_OTHER): Payer: Self-pay | Admitting: Internal Medicine

## 2009-07-04 ENCOUNTER — Telehealth (INDEPENDENT_AMBULATORY_CARE_PROVIDER_SITE_OTHER): Payer: Self-pay | Admitting: Internal Medicine

## 2009-07-06 ENCOUNTER — Ambulatory Visit: Payer: Self-pay | Admitting: Internal Medicine

## 2009-07-16 ENCOUNTER — Encounter: Payer: Self-pay | Admitting: Internal Medicine

## 2009-07-25 ENCOUNTER — Telehealth (INDEPENDENT_AMBULATORY_CARE_PROVIDER_SITE_OTHER): Payer: Self-pay | Admitting: Internal Medicine

## 2009-07-30 ENCOUNTER — Telehealth (INDEPENDENT_AMBULATORY_CARE_PROVIDER_SITE_OTHER): Payer: Self-pay | Admitting: Internal Medicine

## 2009-08-01 ENCOUNTER — Telehealth (INDEPENDENT_AMBULATORY_CARE_PROVIDER_SITE_OTHER): Payer: Self-pay | Admitting: Internal Medicine

## 2009-08-21 ENCOUNTER — Telehealth (INDEPENDENT_AMBULATORY_CARE_PROVIDER_SITE_OTHER): Payer: Self-pay | Admitting: Internal Medicine

## 2009-08-26 ENCOUNTER — Telehealth (INDEPENDENT_AMBULATORY_CARE_PROVIDER_SITE_OTHER): Payer: Self-pay | Admitting: Internal Medicine

## 2009-10-29 ENCOUNTER — Encounter (INDEPENDENT_AMBULATORY_CARE_PROVIDER_SITE_OTHER): Payer: Self-pay | Admitting: Internal Medicine

## 2009-10-29 ENCOUNTER — Ambulatory Visit: Payer: Self-pay | Admitting: Infectious Diseases

## 2009-10-30 LAB — CONVERTED CEMR LAB
Bilirubin Urine: NEGATIVE
Casts: NONE SEEN /lpf
Crystals: NONE SEEN
Ketones, ur: NEGATIVE mg/dL
Specific Gravity, Urine: 1.025 (ref 1.005–1.0)
Urine Glucose: >1000 mg/dL — AB
pH: 5.5 (ref 5.0–8.0)

## 2009-11-05 ENCOUNTER — Telehealth (INDEPENDENT_AMBULATORY_CARE_PROVIDER_SITE_OTHER): Payer: Self-pay | Admitting: Internal Medicine

## 2009-11-06 ENCOUNTER — Encounter (INDEPENDENT_AMBULATORY_CARE_PROVIDER_SITE_OTHER): Payer: Self-pay | Admitting: Internal Medicine

## 2009-11-13 ENCOUNTER — Encounter: Payer: Self-pay | Admitting: Internal Medicine

## 2009-11-20 ENCOUNTER — Encounter (INDEPENDENT_AMBULATORY_CARE_PROVIDER_SITE_OTHER): Payer: Self-pay | Admitting: Internal Medicine

## 2009-11-25 ENCOUNTER — Encounter (INDEPENDENT_AMBULATORY_CARE_PROVIDER_SITE_OTHER): Payer: Self-pay | Admitting: Internal Medicine

## 2009-11-28 ENCOUNTER — Ambulatory Visit: Payer: Self-pay | Admitting: Internal Medicine

## 2009-11-28 ENCOUNTER — Telehealth: Payer: Self-pay | Admitting: *Deleted

## 2009-11-28 ENCOUNTER — Encounter: Payer: Self-pay | Admitting: Internal Medicine

## 2009-11-28 ENCOUNTER — Telehealth (INDEPENDENT_AMBULATORY_CARE_PROVIDER_SITE_OTHER): Payer: Self-pay | Admitting: *Deleted

## 2009-11-28 DIAGNOSIS — J449 Chronic obstructive pulmonary disease, unspecified: Secondary | ICD-10-CM | POA: Insufficient documentation

## 2010-01-01 ENCOUNTER — Encounter (INDEPENDENT_AMBULATORY_CARE_PROVIDER_SITE_OTHER): Payer: Self-pay | Admitting: Internal Medicine

## 2010-01-19 ENCOUNTER — Encounter (INDEPENDENT_AMBULATORY_CARE_PROVIDER_SITE_OTHER): Payer: Self-pay | Admitting: Internal Medicine

## 2010-01-21 ENCOUNTER — Telehealth (INDEPENDENT_AMBULATORY_CARE_PROVIDER_SITE_OTHER): Payer: Self-pay | Admitting: Internal Medicine

## 2010-01-23 ENCOUNTER — Encounter (INDEPENDENT_AMBULATORY_CARE_PROVIDER_SITE_OTHER): Payer: Self-pay | Admitting: Internal Medicine

## 2010-01-23 HISTORY — PX: CARDIOVASCULAR STRESS TEST: SHX262

## 2010-01-26 ENCOUNTER — Ambulatory Visit: Payer: Self-pay | Admitting: Internal Medicine

## 2010-01-26 ENCOUNTER — Telehealth (INDEPENDENT_AMBULATORY_CARE_PROVIDER_SITE_OTHER): Payer: Self-pay | Admitting: Internal Medicine

## 2010-01-30 ENCOUNTER — Encounter: Payer: Self-pay | Admitting: Internal Medicine

## 2010-01-30 ENCOUNTER — Telehealth (INDEPENDENT_AMBULATORY_CARE_PROVIDER_SITE_OTHER): Payer: Self-pay | Admitting: *Deleted

## 2010-03-05 ENCOUNTER — Encounter: Payer: Self-pay | Admitting: Internal Medicine

## 2010-03-23 ENCOUNTER — Telehealth: Payer: Self-pay | Admitting: Internal Medicine

## 2010-03-25 ENCOUNTER — Ambulatory Visit: Payer: Self-pay | Admitting: Internal Medicine

## 2010-03-25 LAB — CONVERTED CEMR LAB
Bilirubin Urine: NEGATIVE
Blood Glucose, Fingerstick: 334
Creatinine, Urine: 92.7 mg/dL
Glucose, Urine, Semiquant: >=1000
Hgb A1c MFr Bld: 11.9 %
Microalb Creat Ratio: 237.5 mg/g — ABNORMAL HIGH (ref 0.0–30.0)
Nitrite: NEGATIVE
Urobilinogen, UA: 1
pH: 5

## 2010-04-06 ENCOUNTER — Telehealth: Payer: Self-pay | Admitting: Internal Medicine

## 2010-04-07 ENCOUNTER — Ambulatory Visit: Payer: Self-pay | Admitting: Internal Medicine

## 2010-04-07 LAB — CONVERTED CEMR LAB
OCCULT 1: NEGATIVE
OCCULT 2: NEGATIVE

## 2010-05-14 IMAGING — CR DG KNEE 1-2V*R*
2 series · 2 of 2 positions shown · non-contrast
Comparison: 05/07/2005

CLINICAL DATA: Chronic pain

RIGHT KNEE - 1-2 VIEW

[t knee ap right]
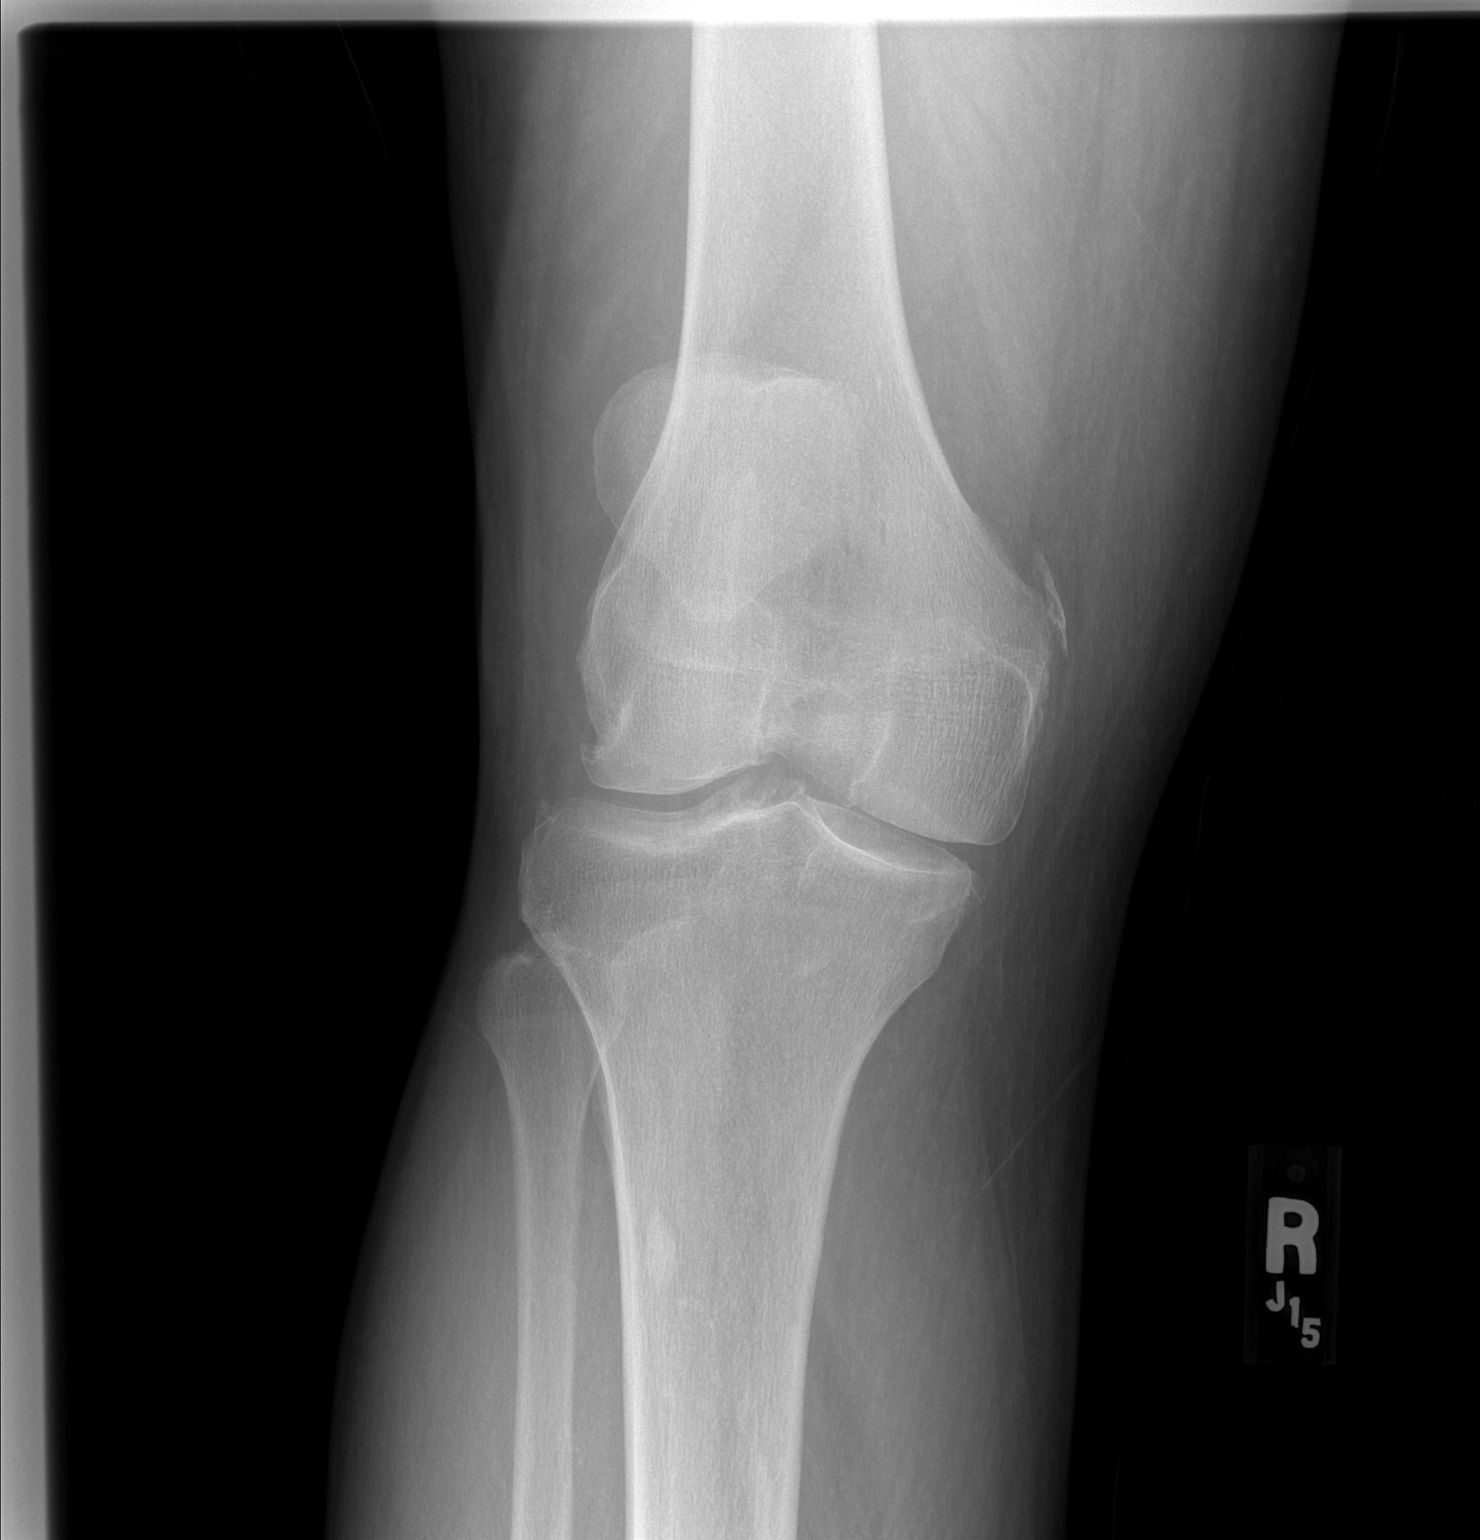

[t knee lat right]
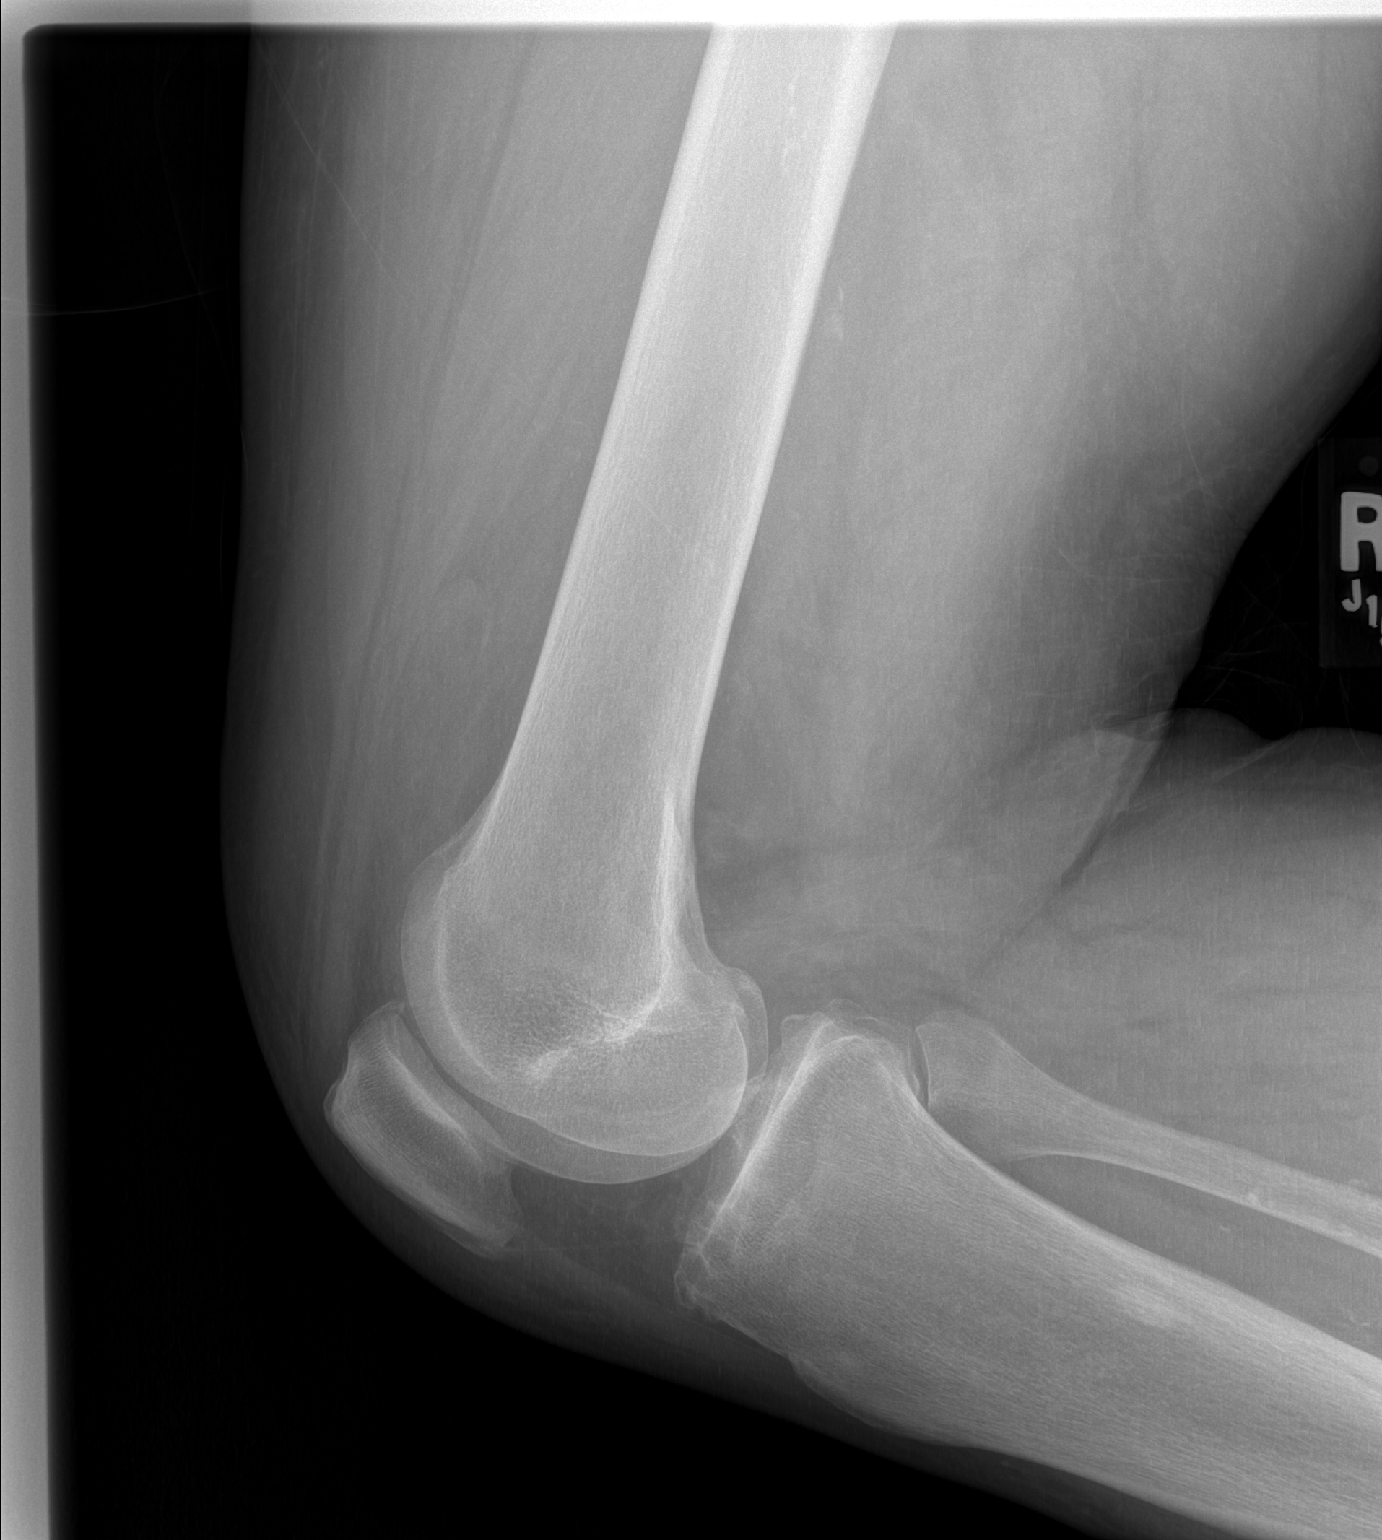

[2 of 2 positions shown; findings below may reference images not displayed]

FINDINGS: Two views of the right knee shows no acute fracture or
subluxation.  Degenerative changes are seen with mild narrowing of
the medial joint compartment.  There is spurring of the lateral
tibial plateau and lateral femoral condyle.  Soft tissue
calcification adjacent to medial femoral condyle again noted
without significant change.  Mild narrowing of patellofemoral joint
space noted.  Mild spurring of patella is seen.  Small effusion in
suprapatellar bursa.
IMPRESSION: No acute fracture or subluxation.  Degenerative changes are noted.
Small effusion in the suprapatellar bursa.

## 2010-05-14 IMAGING — CR DG CHEST 2V
2 series · 2 of 2 positions shown · non-contrast
Comparison: 11/25/2005.

CLINICAL DATA: Coughing for 2 weeks

CHEST - 2 VIEW

[w chest pa]
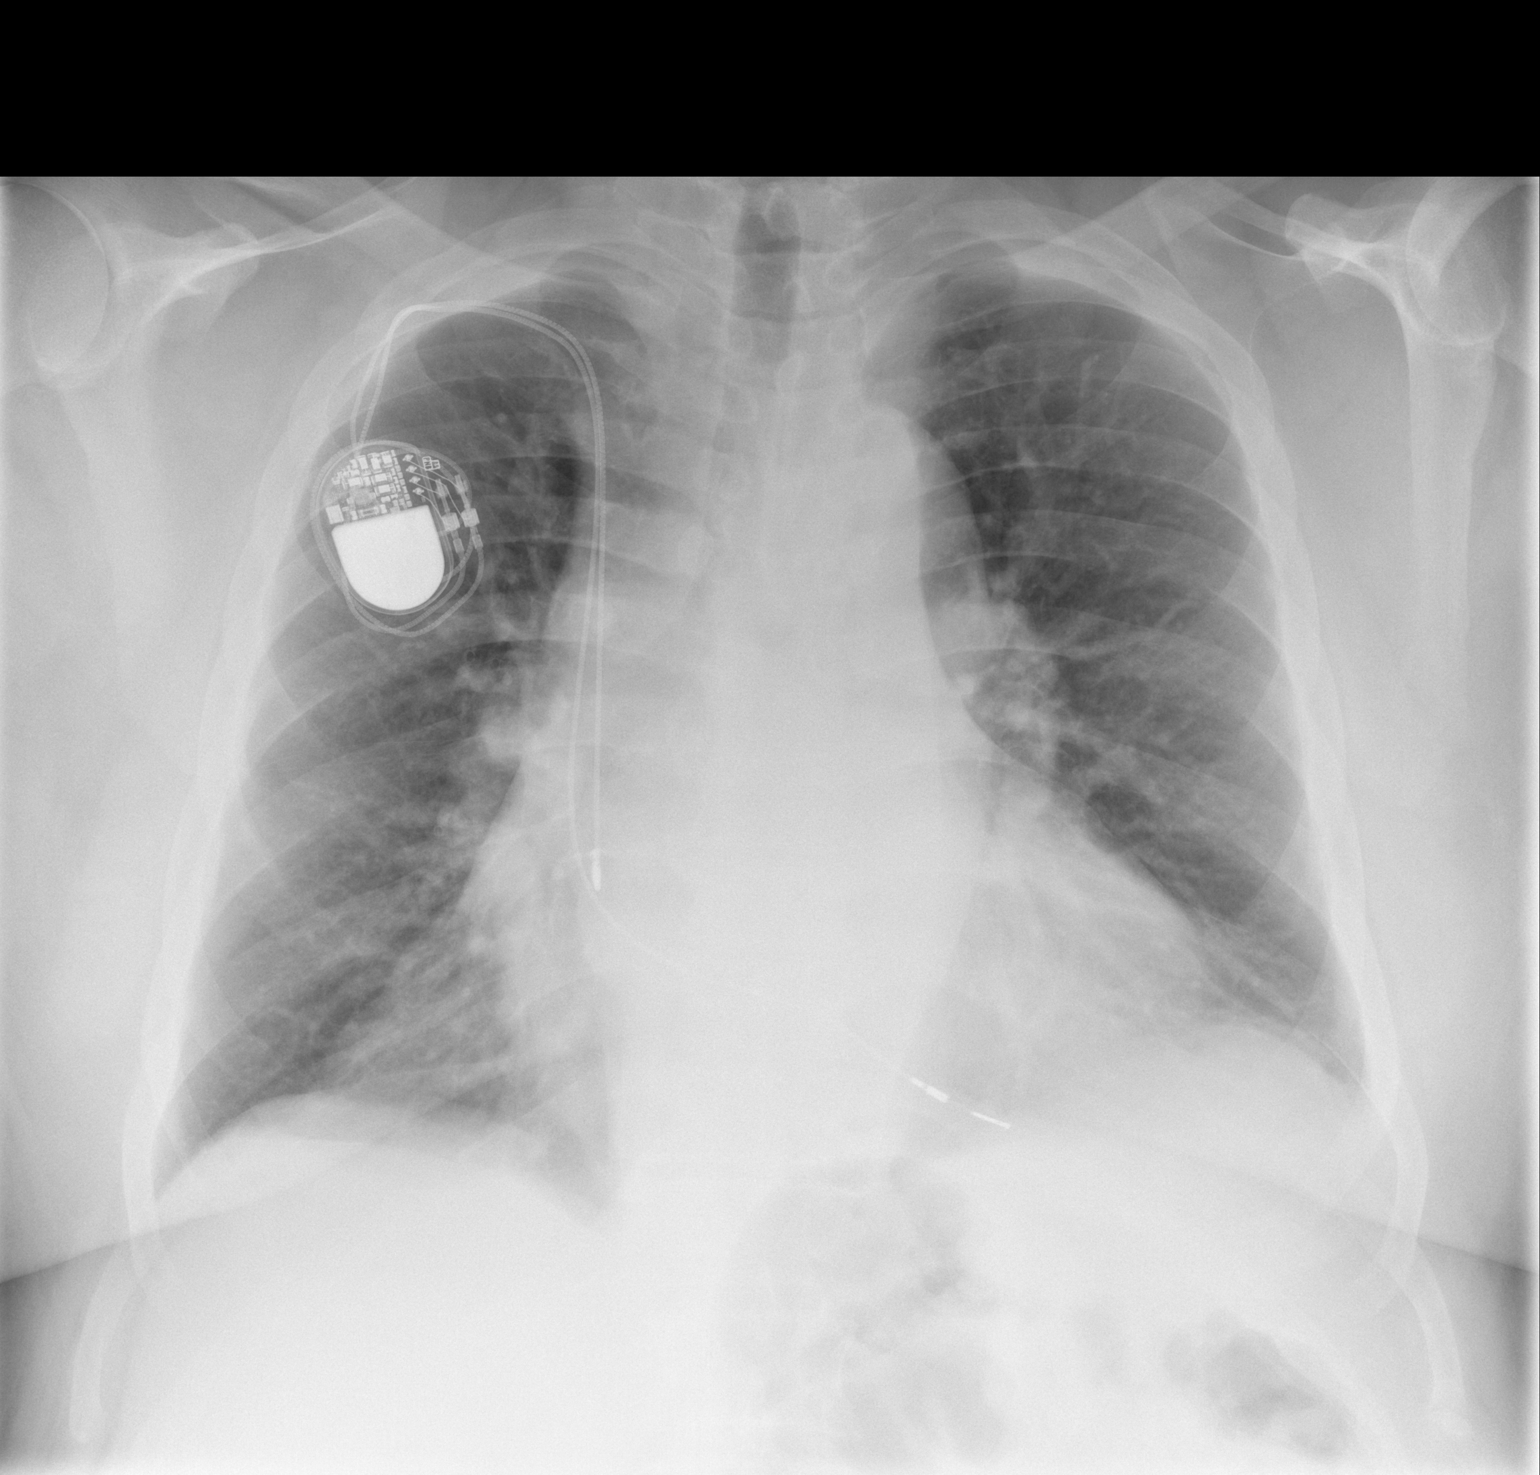

[w chest lat *]
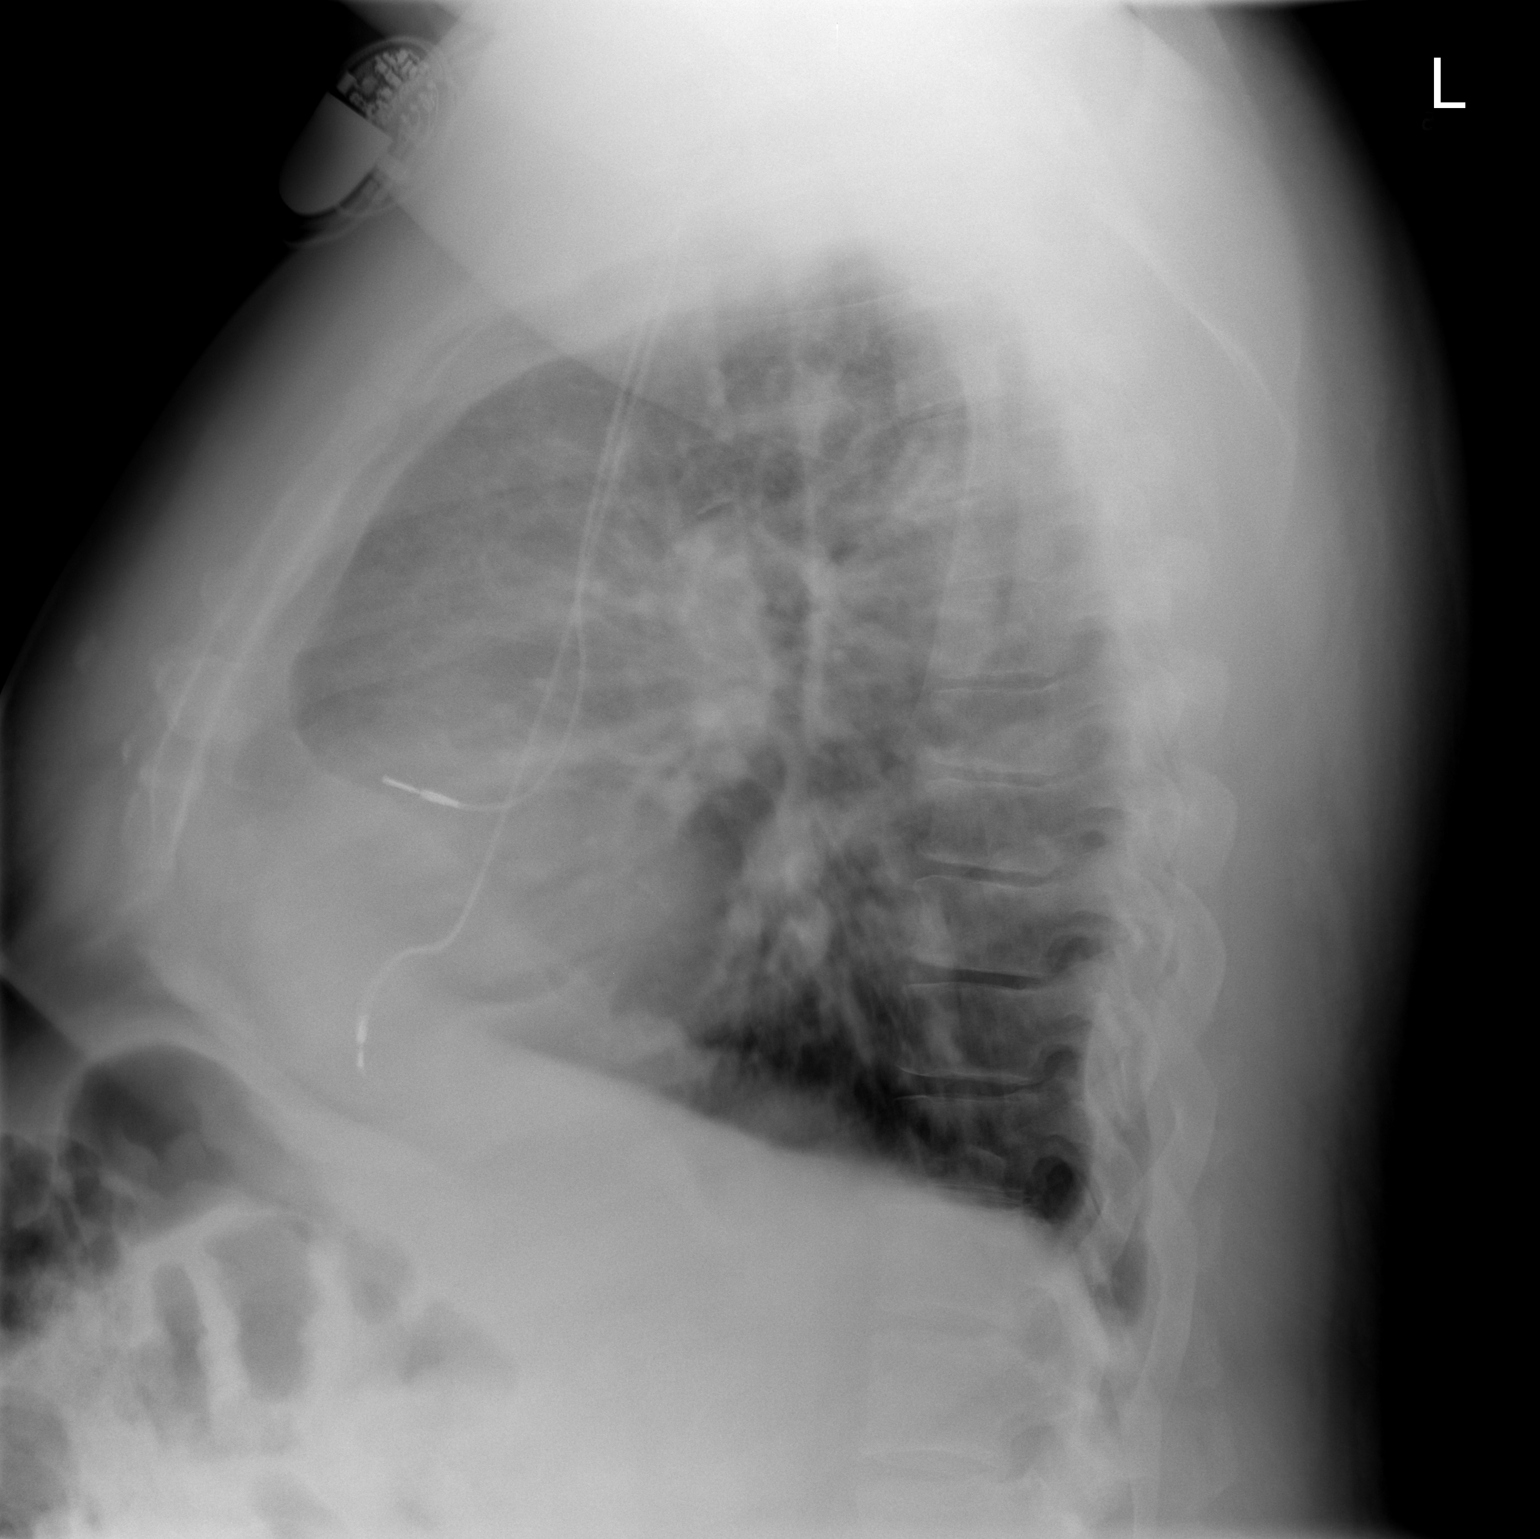

[2 of 2 positions shown; findings below may reference images not displayed]

FINDINGS: A dual lead transvenous pacemaker is in place with
battery pack on the right.  There is stable moderate enlargement of
the cardiac silhouette.  There is mild basilar atelectasis on the
left with increased markings with elevation left hemidiaphragm.
There is density within the right lung base medially unchanged in
appearance from prior studies consistent with prominent epicardial
fat pad..  There is flattening of the diaphragm on lateral view
cyst with mild hyperinflation configuration overall. No pleural
effusion is seen.  Bones appear average for age.
IMPRESSION: There is an overall hyperinflation configuration.  There is minimal
atelectasis with increased markings in the left lung base with
elevation of left hemidiaphragm..  Density in right cardiophrenic
angle region on the PA view is unchanged previous studies
consistent with prominent anterior medial fat pad.

## 2010-06-29 ENCOUNTER — Encounter: Payer: Self-pay | Admitting: Internal Medicine

## 2010-06-29 LAB — CONVERTED CEMR LAB
AST: 18 units/L
Albumin: 4.1 g/dL
Alkaline Phosphatase: 117 units/L
BUN: 17 mg/dL
Calcium: 9.8 mg/dL
Chloride: 97 meq/L
Creatinine, Ser: 1.12 mg/dL
HDL: 32 mg/dL
Hgb A1c MFr Bld: 14.6 %
LDL Cholesterol: 118 mg/dL
Platelets: 168 10*3/uL
Potassium: 4.1 meq/L
Sodium: 135 meq/L
Total Protein: 7.6 g/dL
Triglycerides: 107 mg/dL

## 2010-07-03 ENCOUNTER — Encounter: Payer: Self-pay | Admitting: Internal Medicine

## 2010-07-07 ENCOUNTER — Ambulatory Visit: Payer: Self-pay | Admitting: Internal Medicine

## 2010-07-07 LAB — CONVERTED CEMR LAB: Blood Glucose, Fingerstick: 447

## 2010-08-06 ENCOUNTER — Encounter: Payer: Self-pay | Admitting: Internal Medicine

## 2010-09-17 NOTE — Letter (Signed)
Summary: [facility]   Imported By: Garlan Fillers 01/14/2010 14:22:57  _____________________________________________________________________  External Attachment:    Type:   Image     Comment:   External Document

## 2010-09-17 NOTE — Progress Notes (Signed)
Summary: refill/gg  Phone Note Refill Request  on November 05, 2009 11:45 AM  Refills Requested: Medication #1:  COLCRYS 0.6 MG TABS Take 1 tablet by mouth once a day as needed for gout   Last Refilled: 10/08/2009  Method Requested: Electronic Initial call taken by: Gevena Cotton RN,  November 05, 2009 11:45 AM    Prescriptions: COLCRYS 0.6 MG TABS (COLCHICINE) Take 1 tablet by mouth once a day as needed for gout  #30 x 6   Entered and Authorized by:   Myrtis Ser MD   Signed by:   Myrtis Ser MD on 11/05/2009   Method used:   Electronically to        [facility] [phone]* (retail)       [address]       [place], [place]  [address]       Ph: [phone] or [phone]       Fax: [phone]   RxID:   [id number]

## 2010-09-17 NOTE — Progress Notes (Signed)
Summary: refill/gg  Phone Note Refill Request   Pt needs Embrace glucose test strips...must be added to med list.   Method Requested: Electronic Initial call taken by: Gevena Cotton RN,  August 26, 2009 11:28 AM    New/Updated Medications: EMBRACE BLOOD GLUCOSE TEST  STRP (GLUCOSE BLOOD) Check blood sugars four times daily. Prescriptions: EMBRACE BLOOD GLUCOSE TEST  STRP (GLUCOSE BLOOD) Check blood sugars four times daily.  #120 x 6   Entered and Authorized by:   Myrtis Ser MD   Signed by:   Myrtis Ser MD on 08/26/2009   Method used:   Electronically to        [facility]* (retail)       [place]  [address]       Ph: [phone]       Fax: [phone]   RxID:   [id number]

## 2010-09-17 NOTE — Assessment & Plan Note (Signed)
Summary: infection/gg   Vital Signs:  Patient profile:   65 year old male Height:      72 inches Weight:      318.9 pounds BMI:     43.41 Temp:     97.6 degrees F oral Pulse rate:   80 / minute BP sitting:   182 / 104  (right arm)  Vitals Entered By: Silverio Decamp NT II (March 25, 2010 2:20 PM)   Diabetic Foot Exam Foot Inspection Is there a history of a foot ulcer?              Yes Is there a foot ulcer now?              No Can the patient see the bottom of their feet?          Yes Are the shoes appropriate in style and fit?          Yes Is there swelling or an abnormal foot shape?          Yes Are the toenails long?                No Are the toenails thick?                Yes Are the toenails ingrown?              No Is there heavy callous build-up?              Yes Is there a claw toe deformity?              No Is there elevated skin temperature?            No Is there limited ankle dorsiflexion?            No Is there foot or ankle muscle weakness?            No  Diabetic Foot Care Education Patient educated on appropriate care of diabetic feet.  Pulse Check          Right Foot          Left Foot Posterior Tibial:        normal            normal Dorsalis Pedis:        normal            normal  High Risk Feet? Yes   10-g (5.07) Semmes-Weinstein Monofilament Test           Right Foot          Left Foot Visual Inspection               Test Control      normal         normal Site 1         normal         abnormal Site 2         normal         normal Site 3         normal         normal Site 4         normal         normal Site 5         normal         normal Site 6         normal         normal  Site 7         normal         normal Site 8         normal         normal Site 9         normal         normal Site 10         normal         normal  Impression      abnormal         abnormal  Legend:  Site 1 = Plantar aspect of first toe (center of pad) Site 2 =  Plantar aspect of third toe (center of pad) Site 3 = Plantar aspect of fifth toe (center of pad) Site 4 = Plantar aspect of first metatarsal head Site 5 = Plantar aspect of third metatarsal head Site 6 = Plantar aspect of fifth metatarsal head Site 7 = Plantar aspect of medial midfoot Site 8 = Plantar aspect of lateral midfoot Site 9 = Plantar aspect of heel Site 10 = dorsal aspect of foot between the base of the first and second toes   Result is Abnormal if patient was unable to perceive the monofilament at site indicated.    Immunization History:  Pneumovax Immunization History:    Pneumovax:  pneumovax (08/24/2008)  Immunizations Administered:  Tetanus Vaccine:    Vaccine Type: Tdap    Site: right deltoid    Mfr: GlaxoSmithK[employer]    Dose: 0.5 ml    Route: IM    Given by: Yvonna Alanis RN    Exp. Date: 02/13/2012    Lot #: [id number]    VIS given: 07/04/07 version given March 25, 2010. CC: INFECTION X 1 WEEK  Is Patient Diabetic? Yes Did you bring your meter with you today? No Pain Assessment Patient in pain? no      Nutritional Status BMI of > 30 = obese CBG Result 334  Have you ever been in a relationship where you felt threatened, hurt or afraid?No   Does patient need assistance? Functional Status Self care Ambulation Normal   Primary Care Provider:  Myrtis Ser MD  CC:  INFECTION X 1 WEEK .  History of Present Illness: 1.c/o dysuria and "raw" penis for 1 week; without any hematuria, discharge, fever, chills. Sexually active with multiple partners without any protection. Had STD screen 8 months ago "which was normal" per patient's report. 2. HTN --is not taking his medications for 1 month. Had them refilled at the begining of this month. Denies any HA, visual changes or weakness.   Preventive Screening-Counseling & Management  Alcohol-Tobacco     Alcohol drinks/day: 0     Smoking Status: never     Passive Smoke Exposure:  no  Caffeine-Diet-Exercise     Does Patient Exercise: yes     Type of exercise: NO  Problems Prior to Update: 1)  Chronic Obstructive Pulmonary Disease, Acute Exacerbation  (ICD-491.21) 2)  Coronary Artery Disease  (ICD-414.00) 3)  Cardiomyopathy  (ICD-425.4) 4)  Status, Cardiac Pacemaker  (ICD-V45.01) 5)  Hypertension  (ICD-401.9) 6)  Hyperlipidemia  (ICD-272.4) 7)  Diabetes Mellitus, Type II  (ICD-250.00) 8)  Diabetic Ulcer, Right Leg  (ICD-250.80) 9)  Renal Insufficiency  (ICD-588.9) 10)  Knee Pain, Right  (ICD-719.46) 11)  Patello-femoral Syndrome  (ICD-719.46) 12)  Macular Degeneration  (ICD-362.50) 13)  Benign Prostatic Hypertrophy, Hx of  (ICD-V13.8) 14)  Sleep Apnea, Obstructive  (ICD-327.23) 15)  Gout  (  ICD-274.9) 16)  Uti  (ICD-599.0) 17)  Balanitis  (ICD-607.1) 18)  Urinary Incontinence  (ICD-788.30) 19)  Guaiac Positive Stool  (ICD-578.1) 20)  Obesity  (ICD-278.00) 21)  Genital Herpes  (ICD-054.10) 22)  Hernia, Umbilical  ([id number])  Current Problems (verified): 1)  Chronic Obstructive Pulmonary Disease, Acute Exacerbation  (ICD-491.21) 2)  Coronary Artery Disease  (ICD-414.00) 3)  Cardiomyopathy  (ICD-425.4) 4)  Status, Cardiac Pacemaker  (ICD-V45.01) 5)  Hypertension  (ICD-401.9) 6)  Hyperlipidemia  (ICD-272.4) 7)  Diabetes Mellitus, Type II  (ICD-250.00) 8)  Diabetic Ulcer, Right Leg  (ICD-250.80) 9)  Renal Insufficiency  (ICD-588.9) 10)  Knee Pain, Right  (ICD-719.46) 11)  Patello-femoral Syndrome  (ICD-719.46) 12)  Macular Degeneration  (ICD-362.50) 13)  Benign Prostatic Hypertrophy, Hx of  (ICD-V13.8) 14)  Sleep Apnea, Obstructive  (ICD-327.23) 15)  Gout  (ICD-274.9) 16)  Uti  (ICD-599.0) 17)  Balanitis  (ICD-607.1) 18)  Urinary Incontinence  (ICD-788.30) 19)  Guaiac Positive Stool  (ICD-578.1) 20)  Obesity  (ICD-278.00) 21)  Genital Herpes  (ICD-054.10) 22)  Hernia, Umbilical  ([id number])  Medications Prior to Update: 1)  Colcrys 0.6 Mg Tabs  (Colchicine) .... Take 1 Tablet By Mouth Once A Day As Needed For Gout 2)  Norvasc 10 Mg Tabs (Amlodipine Besylate) .... Take One Tablet Daily For Blood Pressure. 3)  Catapres 0.3 Mg Tabs (Clonidine Hcl) .... Take One Tablet Twice A Day. 4)  Prilosec 20 Mg Cpdr (Omeprazole) .... Take One Tablet Daily. 5)  Aspirin 81 Mg Tbec (Aspirin) .... Take 1 Tablet By Mouth Once A Day 6)  Combivent 103-18 Mcg/act Aero (Ipratropium-Albuterol) .... Inhale 1-2 Puff Every 6 Hours As Needed For Shortness of Breath. 7)  Coreg 25 Mg  Tabs (Carvedilol) .... Take One Tablet Twice A Day. 8)  Lantus Solostar 100 Unit/ml  Soln (Insulin Glargine) .... Inject 75 Units Subcutaneously Daily From Solostar Injector Pen 9)  Pen Needles 31g X 8 Mm  Misc (Insulin Pen Needle) .... Use With Lantus and Humalog To Inject Insulin 5x A Day ( 250.00) 10)  Furosemide 40 Mg Tabs (Furosemide) .... Take 1 Tablet By Mouth Two Times A Day 11)  Glipizide 10 Mg Tabs (Glipizide) .... Take One Tablet Two Times A Day. 12)  Metformin Hcl 1000 Mg Tabs (Metformin Hcl) .... Take One Tablet Two Times A Day. 13)  Lipitor 80 Mg Tabs (Atorvastatin Calcium) .... Take One Tablet Daily For Cholesterol. 14)  Cymbalta 60 Mg Cpep (Duloxetine Hcl) .... Take 1 Tablet By Mouth Once A Day 15)  Klor-Con 20 Meq Pack (Potassium Chloride) .... Take 1 Tablet By Mouth Once A Day 16)  Lisinopril 20 Mg Tabs (Lisinopril) .... Take 1 Tablet By Mouth Once A Day 17)  Embrace Blood Glucose Test  Strp (Glucose Blood) .... Check Blood Sugars Four Times Daily. 18)  Bactrim Ds 800-160 Mg Tabs (Sulfamethoxazole-Trimethoprim) .... Take 1 Tablet By Mouth Two Times A Day For Seven Days 19)  Clotrimazole 1 % Crea (Clotrimazole) .... Apply To Affected Area Three Times A Day 20)  Advocate Test  Strp (Glucose Blood) .... Use To Check Blood Sugar Before Meals and Bedtime 4x Eachday 21)  Humalog Kwikpen 100 Unit/ml Soln (Insulin Lispro (Human)) .... Blood Sugar Between 150-200 Use 2 Units,  201-250 = 4 Units,  251-300 = 6 Units, 301-350 = 8 Units, 351-400 = 10 Units, For >400 Use 12 Units & Call Dr.up To 40 Units/d 22)  Doxycycline Hyclate 100 Mg Caps (Doxycycline Hyclate) .... Take One  Pill Two Times A Day For 10 Days 23)  Prednisone 10 Mg Tabs (Prednisone) .... Take 3 Pills By Mouth Once Daily For 3 Days, Starting Today, Then Take 2 Pills Once Daily For 3 Days, Then 1 Pill By Mouth Once Daily For 3 Days 24)  Pen Needles 1/2" 29g X 36mm Misc (Insulin Pen Needle) .... Use To Inject Mealtime Insulin 25)  Onetouch Delica Lancets  Misc (Lancets) .... Use To Check Blood Sugar 2-3x Daily( 250.00) 26)  Onetouch Ultra Test  Strp (Glucose Blood) .... Use To Check Blood Sugar 2-3x A Day: At [facility] Before Breakfast and  Before Dinner ( 250.02)  Allergies (verified): No Known Drug Allergies  Directives (verified): 1)  Full Code   Past History:  Past Medical History: Last updated: 01/31/2008 Diabetes mellitus, type II Gout Hyperlipidemia Hypertension obstructive sleep apnea/obesity hypoventilation syndrome benign prostatic hypertrophy morbid obesity Hypertensive cardiomyopathy, diastolic dysfuncion by [id number] ECHOl 2-vessel Nonobstructive Coronary artery disease- cath 3/05; 60% LAD, 30% Cx pacemaker 2005 for bradycarida legally blind umbilical hernia herpes Heme positive stool 8/07 referred to [facility] for colonoscopy  Degenerative disease: Right knee  Family History: Last updated: 11/07/2008 whole family with coronary artery disease and htn   Social History: Last updated: 11/07/2008 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 Head:  Normocephalic and atraumatic without obvious abnormalities. No apparent alopecia or balding. Eyes:  No corneal or conjunctival inflammation noted. EOMI. Perrla. Funduscopic exam benign, without hemorrhages, exudates or papilledema. Vision grossly normal. Ears:  External ear exam shows no significant lesions or deformities.  Otoscopic examination reveals clear canals, tympanic membranes are intact bilaterally without bulging, retraction, inflammation or discharge. Hearing is grossly normal bilaterally. Nose:  External nasal examination shows no deformity or inflammation. Nasal mucosa are pink and moist without lesions or exudates. Mouth:  Oral mucosa and oropharynx without lesions or exudates.  Teeth in good repair. Lungs:  Normal respiratory effort, chest expands symmetrically. Lungs are clear to auscultation, no crackles or wheezes. Heart:  Tachycardic and regular rhythm.  no murmur, no gallop, and no rub.   Abdomen:  soft, non-tender, normal bowel sounds, no masses, and no inguinal hernia.  Old and well-heald vertical scar down from ubilicus noted.soft, non-tender, normal bowel sounds, no masses, and no inguinal hernia.   Rectal:  no external abnormalities, no hemorrhoids, normal sphincter tone, no masses, no tenderness, and no fissures.   Genitalia:  uncircumcised; excoriated glans penis; TTP; no discharge. No ulceration noted. Msk:  No deformity or scoliosis noted of thoracic or lumbar spine.   Pulses:  R and L carotid,radial,femoral,dorsalis pedis and posterior tibial pulses are full and equal bilaterally Extremities:  No clubbing, cyanosis, edema, or deformity noted with normal full range of motion of all joints.   Neurologic:  No cranial nerve deficits noted. Station and gait are normal. Plantar reflexes are down-going bilaterally. DTRs are symmetrical throughout. Sensory, motor and coordinative functions appear  intact. Skin:  Stasis dermatitis to both LE's. Inguinal Nodes:  No significant adenopathy Psych:  Cognition and judgment appear intact. Alert and cooperative with normal attention span and concentration. No apparent delusions, illusions, hallucinations  Diabetes Management Exam:  Foot Exam (with socks and/or shoes not present):       Sensory-Monofilament:          Left foot: abnormal          Right foot: abnormal   Impression & Recommendations:  Problem # 1:  HYPERTENSION (ICD-401.9) Uncotnrolled due to lack of adherance with Tx pan. HTN, etiology, risks discussed. Weight manangment discussed. Strongly encouraged to attend Ms. Ovid Curd' nutrition classess. Daily exercise 30 min . His updated medication list for this problem includes:    Norvasc 10 Mg Tabs (Amlodipine besylate) .Marland Kitchen... Take one tablet daily for blood pressure.    Catapres 0.3 Mg Tabs (Clonidine hcl) .Marland Kitchen... Take one tablet twice a day.    Coreg 25 Mg Tabs (Carvedilol) .Marland Kitchen... Take one tablet twice a day.    Furosemide 40 Mg Tabs (Furosemide) .Marland Kitchen... Take 1 tablet by mouth two times a day    Lisinopril 20 Mg Tabs (Lisinopril) .Marland Kitchen... Take 1 tablet by mouth once a day  BP today: 182/104 Prior BP: 191/113 (11/28/2009)  Prior 10 Yr Risk Heart Disease: N/A (01/04/2007)  Labs Reviewed: K+: 4.1 (05/15/2009) Creat: : 1.25 (05/15/2009)   Chol: 214 (05/15/2009)   HDL: 35 (05/15/2009)   LDL: 135 (05/15/2009)   TG: 222 (05/15/2009)  Problem # 2:  DIABETES MELLITUS, TYPE II (ICD-250.00) Uncontrolled due to lack of adherance with Tx regimen including weightmanagment and diet. Risks of DM discussed. Patient verbalized understanding. Refuses to see Ms. Ovid Curd or Ms. Tessiatore to discuss the matter. His updated medication list for this problem includes:    Aspirin 81 Mg Tbec (Aspirin) .Marland Kitchen... Take 1 tablet by mouth once a day    Lantus Solostar 100 Unit/ml Soln (Insulin glargine) ..... Inject 75 units subcutaneously daily from solostar injector  pen    Glipizide 10 Mg Tabs (Glipizide) .Marland Kitchen... Take one tablet two times a day.    Metformin Hcl 1000 Mg Tabs (Metformin hcl) .Marland Kitchen... Take one tablet two times a day.    Lisinopril 20 Mg Tabs (Lisinopril) .Marland Kitchen... Take 1 tablet by mouth once a day    Humalog Kwikpen 100 Unit/ml Soln (Insulin lispro (human)) ..... Blood sugar between 150-200 use 2 units, 201-250 = 4 units,  251-300 = 6 units, 301-350 = 8 units, 351-400 = 10 units, for >400 use 12 units & call dr.up to 40 units/d  Orders: T- Capillary Blood Glucose [id number]) Ophthalmology Referral (Ophthalmology)  Labs Reviewed: Creat: 1.25 (05/15/2009)    Reviewed HgBA1c results: 11.9 (03/25/2010)  12.8 (10/29/2009)  Problem # 3:  BALANITIS (ICD-607.1) Likely, r/t uncontrolled DM. Etiology, treatment options discussed. Diflucan 150 mg by mouth x1. Hydrocortisone 1% cream apply thin layer to rash two times a day; OTC lactobacilus by mouth daily; needs a MUCH BETTER DM control!!!! Personal hygienen discussed. Patient refused HIV or HepC testing. Once DM is better controlled --may consider circumcision. Orders: T-Chlamydia  Probe, urine [phone])  Complete Medication List: 1)  Colcrys 0.6 Mg Tabs (Colchicine) .... Take 1 tablet by mouth once a day as needed for gout 2)  Norvasc 10 Mg Tabs (Amlodipine besylate) .... Take one tablet daily for blood pressure. 3)  Catapres 0.3 Mg Tabs (Clonidine hcl) .... Take one tablet twice a day. 4)  Prilosec 20 Mg Cpdr (Omeprazole) .... Take one tablet daily. 5)  Aspirin 81 Mg Tbec (Aspirin) .... Take 1 tablet by mouth once a day 6)  Combivent 103-18 Mcg/act Aero (Ipratropium-albuterol) .... Inhale 1-2 puff every 6 hours as needed for shortness of  breath. 7)  Coreg 25 Mg Tabs (Carvedilol) .... Take one tablet twice a day. 8)  Lantus Solostar 100 Unit/ml Soln (Insulin glargine) .... Inject 75 units subcutaneously daily from solostar injector pen 9)  Pen Needles 31g X 8 Mm Misc (Insulin pen needle) .... Use with  lantus and humalog to inject insulin 5x a day ( 250.00) 10)  Furosemide 40 Mg Tabs (Furosemide) .... Take 1 tablet by mouth two times a day 11)  Glipizide 10 Mg Tabs (Glipizide) .... Take one tablet two times a day. 12)  Metformin Hcl 1000 Mg Tabs (Metformin hcl) .... Take one tablet two times a day. 13)  Lipitor 80 Mg Tabs (Atorvastatin calcium) .... Take one tablet daily for cholesterol. 14)  Cymbalta 60 Mg Cpep (Duloxetine hcl) .... Take 1 tablet by mouth once a day 15)  Klor-con 20 Meq Pack (Potassium chloride) .... Take 1 tablet by mouth once a day 16)  Lisinopril 20 Mg Tabs (Lisinopril) .... Take 1 tablet by mouth once a day 17)  Embrace Blood Glucose Test Strp (Glucose blood) .... Check blood sugars four times daily. 18)  Clotrimazole 1 % Crea (Clotrimazole) .... Apply to affected area three times a day 19)  Advocate Test Strp (Glucose blood) .... Use to check blood sugar before meals and bedtime 4x eachday 20)  Humalog Kwikpen 100 Unit/ml Soln (Insulin lispro (human)) .... Blood sugar between 150-200 use 2 units, 201-250 = 4 units,  251-300 = 6 units, 301-350 = 8 units, 351-400 = 10 units, for >400 use 12 units & call dr.up to 40 units/d 21)  Pen Needles 1/2" 29g X 28mm Misc (Insulin pen needle) .... Use to inject mealtime insulin 22)  Onetouch Delica Lancets Misc (Lancets) .... Use to check blood sugar 2-3x daily( 250.00) 23)  Onetouch Ultra Test Strp (Glucose blood) .... Use to check blood sugar 2-3x a day: at least before breakfast and  before dinner ( 250.02) 24)  Fluconazole 150 Mg Tabs (Fluconazole) .... Take 1 tablet by mouth once a day x1 25)  Hydrocortisone 1 % Crea (Hydrocortisone) .... Apply thin layer to rash bid  Other Orders: T-Hgb A1C (in-house) [id number]) T-Hemoccult Card-Multiple (take home) (82270) T-Urine Microalbumin w/creat. ratio [phone]) T-GC Probe, urine [id number]) T-Urinalysis Dipstick only [id number]) Tdap => 45yrs IM [id number]) Admin 1st Vaccine  [id number])  Patient Instructions: 1)  Please, take all your medications as  prescribed. 2)  Abstain from sexual activity for 10 days. 3)  Use condoms on a regular basis when have a sexual intercourse. 4)  Clinic will call you with a referral to an eye doctor. 5)  Call with any questions. 6)  Please, follow up in 4 weeks or sooner. Prescriptions: HYDROCORTISONE 1 % CREA (HYDROCORTISONE) apply thin layer to rash bid  #60 grams x 0   Entered and Authorized by:   Milana Obey MD   Signed by:   Milana Obey MD on 03/25/2010   Method used:   Electronically to        [facility]. [phone]* (retail)       [address], [place]  [address]       Ph: [phone] or [phone]       Fax: ID:[phone]   [id number] FLUCONAZOLE 150 MG TABS (FLUCONAZOLE) Take 1 tablet by mouth once a day x1  #1 x 1   Entered and Authorized by:   Milana Obey MD   Signed  by:   Milana Obey MD on 03/25/2010   Method used:   Electronically to        [facility]. [phone]* (retail)       [address], [place]  [address]       Ph: [phone] or [phone]       Fax: ID:[phone]   RxID:   [id number]   Prevention & Chronic Care Immunizations   Influenza vaccine: Fluvax 3+  (05/15/2009)   Influenza vaccine deferral: Deferred  (03/25/2010)   Influenza vaccine due: 04/16/2010    Tetanus booster: 03/25/2010: Tdap   Tetanus booster due: 03/25/2020    Pneumococcal vaccine: Pneumovax  (08/24/2008)   Pneumococcal vaccine due: 03/17/2015    H. zoster vaccine: Not documented   H. zoster vaccine deferral: Not available  (03/25/2010)  Colorectal Screening   Hemoccult: Positive  (04/20/2006)   Hemoccult action/deferral: Ordered  (03/25/2010)    Colonoscopy: Not documented   Colonoscopy action/deferral: GI referral  (05/15/2009)  Other Screening   PSA: Not documented   PSA action/deferral: Discussion deferred  (11/28/2009)   Smoking status: never   (03/25/2010)  Diabetes Mellitus   HgbA1C: 11.9  (03/25/2010)   HgbA1C action/deferral: Ordered  (03/25/2010)   Hemoglobin A1C due: 06/25/2010    Eye exam: Not documented   Diabetic eye exam action/deferral: Ophthalmology referral  (03/25/2010)    Foot exam: yes  (03/25/2010)   Foot exam action/deferral: Do today   High risk foot: Yes  (03/25/2010)   Foot care education: Done  (03/25/2010)    Urine microalbumin/creatinine ratio: 401.2  (12/16/2008)   Urine microalbumin action/deferral: Ordered   Urine microalbumin/cr due: 03/26/2011    Diabetes flowsheet reviewed?: Yes   Progress toward A1C goal: Unchanged    Stage of readiness to change (diabetes management): Action  Lipids   Total Cholesterol: 214  (05/15/2009)   Lipid panel action/deferral: Lipid Panel ordered   LDL: 135  (05/15/2009)   LDL Direct: Not documented   HDL: 35  (05/15/2009)   Triglycerides: 222  (05/15/2009)   Lipid panel due: 04/25/2010    SGOT (AST): 14  (05/15/2009)   BMP action: Ordered   SGPT (ALT): 31  (05/15/2009)   Alkaline phosphatase: 127  (05/15/2009)   Total bilirubin: 0.5  (05/15/2009)    Lipid flowsheet reviewed?: Yes   Progress toward LDL goal: Unchanged  Hypertension   Last Blood Pressure: 182 / 104  (03/25/2010)   Serum creatinine: 1.25  (05/15/2009)   BMP action: Ordered   Serum potassium 4.1  (123XX123)   Basic metabolic panel due: A999333    Hypertension flowsheet reviewed?: Yes   Progress toward BP goal: Deteriorated    Stage of readiness to change (hypertension management): Action  Self-Management Support :   Personal Goals (by the next clinic visit) :     Personal A1C goal: 7  (05/15/2009)     Personal blood pressure goal: 130/80  (06/09/2009)     Personal LDL goal: 100  (05/15/2009)    Patient will work on the following items until the next clinic visit to reach self-care goals:     Medications and monitoring: take my medicines every day, bring all of my  medications to every visit  (03/25/2010)     Eating: eat more vegetables, use fresh or frozen vegetables, eat foods that are low in salt, eat baked foods instead of fried foods, eat fruit for snacks and desserts, limit or avoid alcohol  (  03/25/2010)     Activity: take a 30 minute walk every day  (03/25/2010)    Diabetes self-management support: Education handout, Resources for patients handout  (03/25/2010)   Diabetes education handout printed   Last diabetes self-management training by diabetes educator: 01/26/2010    Hypertension self-management support: Education handout, Resources for patients handout  (03/25/2010)   Hypertension education handout printed    Lipid self-management support: Education handout, Resources for patients handout  (03/25/2010)     Lipid education handout printed      Resource handout printed.   Nursing Instructions: HgbA1C today (see order) Give tetanus booster today Give Pneumovax today Provide Hemoccult cards with instructions (see order) Refer for screening diabetic eye exam (see order) Diabetic foot exam today   Process Orders Check Orders Results:     [facility]: ABN not required for this insurance Tests Sent for requisitioning (March 26, 2010 9:19 AM):     03/25/2010: [facility] -- T-Urine Microalbumin w/creat. ratio [[id number]] (signed)     03/25/2010: [facility] -- T-GC Probe, urine [phone] (signed)     03/25/2010: [facility] -- T-Chlamydia  Probe, urine [phone] (signed)     Laboratory Results   Urine Tests  Date/Time Received: March 25, 2010 3:24 PM Date/Time Reported: Maryan Rued  March 25, 2010 3:24 PM   Routine Urinalysis   Color: lt. yellow Appearance: Clear Glucose: >=1000   (Normal Range: Negative) Bilirubin: negative   (Normal Range: Negative) Ketone: negative   (Normal Range: Negative) Spec. Gravity: 1.015   (Normal Range:  1.003-1.035) Blood: trace-lysed   (Normal Range: Negative) pH: 5.0   (Normal Range: 5.0-8.0) Protein: 100   (Normal Range: Negative) Urobilinogen: 1.0   (Normal Range: 0-1) Nitrite: negative   (Normal Range: Negative) Leukocyte Esterace: trace   (Normal Range: Negative)     Blood Tests   Date/Time Received: March 25, 2010 2:43 PM Date/Time Reported: Maryan Rued  March 25, 2010 2:43 PM   HGBA1C: 11.9%   (Normal Range: Non-Diabetic - 3-6%   Control Diabetic - 6-8%) CBG Random:: 334mg /dL     Impression & Recommendations:  Problem # 1:  HYPERTENSION (ICD-401.9) Uncotnrolled due to lack of adherance with Tx pan. HTN, etiology, risks discussed. Weight manangment discussed. Strongly encouraged to attend Ms. Ovid Curd' nutrition classess. Daily exercise 30 min . His updated medication list for this problem includes:    Norvasc 10 Mg Tabs (Amlodipine besylate) .Marland Kitchen... Take one tablet daily for blood pressure.    Catapres 0.3 Mg Tabs (Clonidine hcl) .Marland Kitchen... Take one tablet twice a day.    Coreg 25 Mg Tabs (Carvedilol) .Marland Kitchen... Take one tablet twice a day.    Furosemide 40 Mg Tabs (Furosemide) .Marland Kitchen... Take 1 tablet by mouth two times a day    Lisinopril 20 Mg Tabs (Lisinopril) .Marland Kitchen... Take 1 tablet by mouth once a day  BP today: 182/104 Prior BP: 191/113 (11/28/2009)  Prior 10 Yr Risk Heart Disease: N/A (01/04/2007)  Labs Reviewed: K+: 4.1 (05/15/2009) Creat: : 1.25 (05/15/2009)   Chol: 214 (05/15/2009)   HDL: 35 (05/15/2009)   LDL: 135 (05/15/2009)   TG: 222 (05/15/2009)  Problem # 2:  DIABETES MELLITUS, TYPE II (ICD-250.00) Uncontrolled due to lack of adherance with Tx regimen including weightmanagment and diet. Risks of DM discussed. Patient verbalized understanding. Refuses to see Ms. Ovid Curd or Ms. Tessiatore to discuss the matter. His updated medication list for this problem includes:    Aspirin 81 Mg Tbec (Aspirin) .Marland Kitchen... Take  1 tablet by mouth once a day    Lantus Solostar 100 Unit/ml  Soln (Insulin glargine) ..... Inject 75 units subcutaneously daily from solostar injector pen    Glipizide 10 Mg Tabs (Glipizide) .Marland Kitchen... Take one tablet two times a day.    Metformin Hcl 1000 Mg Tabs (Metformin hcl) .Marland Kitchen... Take one tablet two times a day.    Lisinopril 20 Mg Tabs (Lisinopril) .Marland Kitchen... Take 1 tablet by mouth once a day    Humalog Kwikpen 100 Unit/ml Soln (Insulin lispro (human)) ..... Blood sugar between 150-200 use 2 units, 201-250 = 4 units,  251-300 = 6 units, 301-350 = 8 units, 351-400 = 10 units, for >400 use 12 units & call dr.up to 40 units/d  Orders: T- Capillary Blood Glucose [id number]) Ophthalmology Referral (Ophthalmology)  Labs Reviewed: Creat: 1.25 (05/15/2009)    Reviewed HgBA1c results: 11.9 (03/25/2010)  12.8 (10/29/2009)  Problem # 3:  BALANITIS (ICD-607.1) Likely, r/t uncontrolled DM. Etiology, treatment options discussed. Diflucan 150 mg by mouth x1. Hydrocortisone 1% cream apply thin layer to rash two times a day; OTC lactobacilus by mouth daily; needs a MUCH BETTER DM control!!!! Personal hygienen discussed. Patient refused HIV or HepC testing. Once DM is better controlled --may consider circumcision. Orders: T-Chlamydia  Probe, urine [phone])  Complete Medication List: 1)  Colcrys 0.6 Mg Tabs (Colchicine) .... Take 1 tablet by mouth once a day as needed for gout 2)  Norvasc 10 Mg Tabs (Amlodipine besylate) .... Take one tablet daily for blood pressure. 3)  Catapres 0.3 Mg Tabs (Clonidine hcl) .... Take one tablet twice a day. 4)  Prilosec 20 Mg Cpdr (Omeprazole) .... Take one tablet daily. 5)  Aspirin 81 Mg Tbec (Aspirin) .... Take 1 tablet by mouth once a day 6)  Combivent 103-18 Mcg/act Aero (Ipratropium-albuterol) .... Inhale 1-2 puff every 6 hours as needed for shortness of breath. 7)  Coreg 25 Mg Tabs (Carvedilol) .... Take one tablet twice a day. 8)  Lantus Solostar 100 Unit/ml Soln (Insulin glargine) .... Inject 75 units subcutaneously daily from  solostar injector pen 9)  Pen Needles 31g X 8 Mm Misc (Insulin pen needle) .... Use with lantus and humalog to inject insulin 5x a day ( 250.00) 10)  Furosemide 40 Mg Tabs (Furosemide) .... Take 1 tablet by mouth two times a day 11)  Glipizide 10 Mg Tabs (Glipizide) .... Take one tablet two times a day. 12)  Metformin Hcl 1000 Mg Tabs (Metformin hcl) .... Take one tablet two times a day. 13)  Lipitor 80 Mg Tabs (Atorvastatin calcium) .... Take one tablet daily for cholesterol. 14)  Cymbalta 60 Mg Cpep (Duloxetine hcl) .... Take 1 tablet by mouth once a day 15)  Klor-con 20 Meq Pack (Potassium chloride) .... Take 1 tablet by mouth once a day 16)  Lisinopril 20 Mg Tabs (Lisinopril) .... Take 1 tablet by mouth once a day 17)  Embrace Blood Glucose Test Strp (Glucose blood) .... Check blood sugars four times daily. 18)  Clotrimazole 1 % Crea (Clotrimazole) .... Apply to affected area three times a day 19)  Advocate Test Strp (Glucose blood) .... Use to check blood sugar before meals and bedtime 4x eachday 20)  Humalog Kwikpen 100 Unit/ml Soln (Insulin lispro (human)) .... Blood sugar between 150-200 use 2 units, 201-250 = 4 units,  251-300 = 6 units, 301-350 = 8 units, 351-400 = 10 units, for >400 use 12 units & call dr.up to 40 units/d 21)  Pen Needles 1/2" 29g  X 14mm Misc (Insulin pen needle) .... Use to inject mealtime insulin 22)  Onetouch Delica Lancets Misc (Lancets) .... Use to check blood sugar 2-3x daily( 250.00) 23)  Onetouch Ultra Test Strp (Glucose blood) .... Use to check blood sugar 2-3x a day: at least before breakfast and  before dinner ( 250.02) 24)  Fluconazole 150 Mg Tabs (Fluconazole) .... Take 1 tablet by mouth once a day x1 25)  Hydrocortisone 1 % Crea (Hydrocortisone) .... Apply thin layer to rash bid  Other Orders: T-Hgb A1C (in-house) [id number]) T-Hemoccult Card-Multiple (take home) ([id number]) T-Urine Microalbumin w/creat. ratio [phone]) T-GC Probe, urine  [phone]) T-Urinalysis Dipstick only ([id number]) Tdap => 10yrs IM [id number]) Admin 1st Vaccine [id number])

## 2010-09-17 NOTE — Progress Notes (Signed)
Summary: testing supplies/dmr  Phone Note Outgoing Call   Call placed by: Barnabas Harries RD,CDE,  January 30, 2010 2:17 PM Summary of Call: called to follow up on [facility] being bel toprovide testing supplies- they can only fill Medicare D not B- he needs ot get diabetes testing supplies form an alternative company. Discussed wihtpatient: he wants to use new meter and is willing to pay for strips- called [facility] [employer] and they will verify his benfits nad clal him with a quote. I asked patient to please aprise [place] if [facility] will not work out for him so we can document his compnay of choice or pursue other options.     Appended Document: Patient wants to use [facility] for testing supplies/dmr    Phone Note Other Incoming   Summary of Call: received fax from [facility] about patients' testing supplies: they had informed us incorrectly that they could not supply Mr Wierman testing supplies- they CAN now bill Medicare part B and have One Touch Ultra test strips. call in to patient to be sure he stil desires to get supplies through them. Left message.  Initial call taken by: Barnabas Harries RD,CDE,  February 03, 2010 3:34 PM  Follow-up for Phone Call        Patient called nad said he;d like to use [facility] for his testing supplies- he has not talked wiht [facility] and will tell them he does not want to use his services if they call.  Follow-up by: Barnabas Harries RD,CDE,  February 04, 2010 10:08 AM

## 2010-09-17 NOTE — Letter (Signed)
Summary: DIABETIC TESTING SUPPLIES  DIABETIC TESTING SUPPLIES   Imported By: Garlan Fillers 12/22/2009 12:00:32  _____________________________________________________________________  External Attachment:    Type:   Image     Comment:   External Document

## 2010-09-17 NOTE — Progress Notes (Signed)
Summary: Refill/gh  Phone Note Refill Request   Refills Requested: Medication #1:  GLIPIZIDE 10 MG TABS Take one tablet two times a day.  Medication #2:  METFORMIN HCL 1000 MG TABS Take one tablet two times a day.  Medication #3:  LIPITOR 80 MG TABS Take one tablet daily for cholesterol.  Medication #4:  CYMBALTA 60 MG CPEP Take 1 tablet by mouth once a day  Method Requested: Fax to Local Pharmacy Initial call taken by: Sander Nephew RN,  March 23, 2010 3:36 PM  Follow-up for Phone Call        completed refill, thank you Iskra  Follow-up by: [name] MD,  March 24, 2010 1:50 PM

## 2010-09-17 NOTE — Assessment & Plan Note (Signed)
Summary: FU/SB.   Vital Signs:  Patient profile:   65 year old male Height:      72 inches (182.88 cm) Weight:      322.04 pounds (146.38 kg) BMI:     43.83 Temp:     97.4 degrees F (36.33 degrees C) oral Pulse rate:   91 / minute BP sitting:   176 / 97  (right arm) Cuff size:   large  Vitals Entered By: Ryan Nephew RN (July 07, 2010 2:20 PM) CC: Depression Is Patient Diabetic? Yes Did you bring your meter with you today? No Pain Assessment Patient in pain? no      Nutritional Status BMI of > 30 = obese CBG Result 447  Have you ever been in a relationship where you felt threatened, hurt or afraid?No   Does patient need assistance? Functional Status Self care Ambulation Normal Comments Here for follow.  Told to come per Dr. Dan Maker.   Diabetic Foot Exam Foot Inspection Is there a history of a foot ulcer?              No Is there a foot ulcer now?              No Can the patient see the bottom of their feet?          Yes Are the shoes appropriate in style and fit?          No Is there swelling or an abnormal foot shape?          No Are the toenails long?                No Are the toenails thick?                Yes Are the toenails ingrown?              No Is there heavy callous build-up?              Yes Is there pain in the calf muscle (Intermittent claudication) when walking?    NoIs there a claw toe deformity?              No Is there elevated skin temperature?            No Is there limited ankle dorsiflexion?            No Is there foot or ankle muscle weakness?            No  Diabetic Foot Care Education Pulse Check          Right Foot          Left Foot Posterior Tibial:        normal            normal Dorsalis Pedis:        normal            normal Comments: Callus build up at heels .  Callus formation at upper ball of right foot. Dryness noted on both feet and heels. Small darkened spot noted at ball of left foot. High Risk Feet? Yes Set Next  Diabetic Foot Exam here: 07/13/2011   10-g (5.07) Semmes-Weinstein Monofilament Test Performed by: Ryan Nephew RN          Right Foot          Left Foot Visual Inspection               Test Control  abnormal         abnormal Site 1         abnormal         abnormal Site 2         abnormal         abnormal Site 3         abnormal         abnormal Site 4         abnormal         abnormal Site 5         abnormal         abnormal Site 6         abnormal         abnormal Site 7         abnormal         abnormal Site 8         abnormal         abnormal Site 9         abnormal         abnormal Site 10         abnormal         abnormal  Impression      abnormal         abnormal  Legend:  Site 1 = Plantar aspect of first toe (center of pad) Site 2 = Plantar aspect of third toe (center of pad) Site 3 = Plantar aspect of fifth toe (center of pad) Site 4 = Plantar aspect of first metatarsal head Site 5 = Plantar aspect of third metatarsal head Site 6 = Plantar aspect of fifth metatarsal head Site 7 = Plantar aspect of medial midfoot Site 8 = Plantar aspect of lateral midfoot Site 9 = Plantar aspect of heel Site 10 = dorsal aspect of foot between the base of the first and second toes   Result is Abnormal if patient was unable to perceive the monofilament at site indicated.    Primary Care [name]:  Ryan Curet MD  CC:  Depression.  History of Present Illness: Pt is a 40 FLPyo [name] with PMH of poorly controlled DM, HTN, CAD s/p pacemaker, and HLD who came to [facility] for check lab results.  Two weeks ago he went to his cardiologist office and had blood work done, including CBC, CMET and FLP, all unremarkable. He has no c/o, including CP, SOB, HA, fever, diarrhea, dysuria. but has difficult urination and frequency. He gained 3 lbs recently and reported that he has been taking all his meds as instructed. No smoking, ETOH, or drug.   Depression History:      The patient denies a  depressed mood most of the day and a diminished interest in his usual daily activities.         Preventive Screening-Counseling & Management  Alcohol-Tobacco     Alcohol drinks/day: 0     Smoking Status: never     Passive Smoke Exposure: no  Problems Prior to Update: 1)  Chronic Obstructive Pulmonary Disease, Acute Exacerbation  (ICD-491.21) 2)  Coronary Artery Disease  (ICD-414.00) 3)  Cardiomyopathy  (ICD-425.4) 4)  Status, Cardiac Pacemaker  (ICD-V45.01) 5)  Hypertension  (ICD-401.9) 6)  Hyperlipidemia  (ICD-272.4) 7)  Diabetes Mellitus, Type II  (ICD-250.00) 8)  Diabetic Ulcer, Right Leg  (ICD-250.80) 9)  Renal Insufficiency  (ICD-588.9) 10)  Knee Pain, Right  (ICD-719.46) 11)  Patello-femoral Syndrome  (ICD-719.46) 12)  Macular Degeneration  (  ICD-362.50) 13)  Benign Prostatic Hypertrophy, Hx of  (ICD-V13.8) 14)  Sleep Apnea, Obstructive  (ICD-327.23) 15)  Gout  (ICD-274.9) 16)  Urinary Incontinence  (ICD-788.30) 17)  Guaiac Positive Stool  (ICD-578.1) 18)  Obesity  (ICD-278.00) 19)  Genital Herpes  (ICD-054.10) 20)  Hernia, Umbilical  ([id number])  Medications Prior to Update: 1)  Colcrys 0.6 Mg Tabs (Colchicine) .... Take 1 Tablet By Mouth Once A Day As Needed For Gout 2)  Norvasc 10 Mg Tabs (Amlodipine Besylate) .... Take One Tablet Daily For Blood Pressure. 3)  Catapres 0.3 Mg Tabs (Clonidine Hcl) .... Take One Tablet Twice A Day. 4)  Prilosec 20 Mg Cpdr (Omeprazole) .... Take One Tablet Daily. 5)  Aspirin 81 Mg Tbec (Aspirin) .... Take 1 Tablet By Mouth Once A Day 6)  Combivent 103-18 Mcg/act Aero (Ipratropium-Albuterol) .... Inhale 1-2 Puff Every 6 Hours As Needed For Shortness of Breath. 7)  Coreg 25 Mg  Tabs (Carvedilol) .... Take One Tablet Twice A Day. 8)  Lantus Solostar 100 Unit/ml  Soln (Insulin Glargine) .... Inject 75 Units Subcutaneously Daily From Solostar Injector Pen 9)  Pen Needles 31g X 8 Mm  Misc (Insulin Pen Needle) .... Use With Lantus and Humalog  To Inject Insulin 5x A Day ( 250.00) 10)  Furosemide 40 Mg Tabs (Furosemide) .... Take 1 Tablet By Mouth Two Times A Day 11)  Glipizide 10 Mg Tabs (Glipizide) .... Take One Tablet Two Times A Day. 12)  Metformin Hcl 1000 Mg Tabs (Metformin Hcl) .... Take One Tablet Two Times A Day. 13)  Lipitor 80 Mg Tabs (Atorvastatin Calcium) .... Take One Tablet Daily For Cholesterol. 14)  Cymbalta 60 Mg Cpep (Duloxetine Hcl) .... Take 1 Tablet By Mouth Once A Day 15)  Klor-Con 20 Meq Pack (Potassium Chloride) .... Take 1 Tablet By Mouth Once A Day 16)  Lisinopril 40 Mg Tabs (Lisinopril) .... Take 1 Tablet By Mouth Every Morning 17)  Embrace Blood Glucose Test  Strp (Glucose Blood) .... Check Blood Sugars Four Times Daily. 18)  Clotrimazole 1 % Crea (Clotrimazole) .... Apply To Affected Area Three Times A Day 19)  Advocate Test  Strp (Glucose Blood) .... Use To Check Blood Sugar Before Meals and Bedtime 4x Eachday 20)  Humalog Kwikpen 100 Unit/ml Soln (Insulin Lispro (Human)) .... Blood Sugar Between 150-200 Use 2 Units, 201-250 = 4 Units,  251-300 = 6 Units, 301-350 = 8 Units, 351-400 = 10 Units, For >400 Use 12 Units & Call Dr.up To 40 Units/d 21)  Pen Needles 1/2" 29g X 71mm Misc (Insulin Pen Needle) .... Use To Inject Mealtime Insulin 22)  Onetouch Delica Lancets  Misc (Lancets) .... Use To Check Blood Sugar 2-3x Daily( 250.00) 23)  Onetouch Ultra Test  Strp (Glucose Blood) .... Use To Check Blood Sugar 2-3x A Day: At [facility] Before Breakfast and  Before Dinner ( 250.02) 24)  Fluconazole 150 Mg Tabs (Fluconazole) .... Take 1 Tablet By Mouth Once A Day X1 25)  Hydrocortisone 1 % Crea (Hydrocortisone) .... Apply Thin Layer To Rash Bid  Current Medications (verified): 1)  Colcrys 0.6 Mg Tabs (Colchicine) .... Take 1 Tablet By Mouth Once A Day As Needed For Gout 2)  Norvasc 10 Mg Tabs (Amlodipine Besylate) .... Take One Tablet Daily For Blood Pressure. 3)  Catapres 0.3 Mg Tabs (Clonidine Hcl) .... Take One  Tablet Twice A Day. 4)  Prilosec 20 Mg Cpdr (Omeprazole) .... Take One Tablet Daily. 5)  Aspirin 81 Mg Tbec (Aspirin) .Marland KitchenMarland KitchenMarland Kitchen  Take 1 Tablet By Mouth Once A Day 6)  Combivent 103-18 Mcg/act Aero (Ipratropium-Albuterol) .... Inhale 1-2 Puff Every 6 Hours As Needed For Shortness of Breath. 7)  Coreg 25 Mg  Tabs (Carvedilol) .... Take One Tablet Twice A Day. 8)  Lantus Solostar 100 Unit/ml  Soln (Insulin Glargine) .... Inject 75 Units Subcutaneously Daily From Solostar Injector Pen 9)  Pen Needles 31g X 8 Mm  Misc (Insulin Pen Needle) .... Use With Lantus and Humalog To Inject Insulin 5x A Day ( 250.00) 10)  Furosemide 40 Mg Tabs (Furosemide) .... Take 1 Tablet By Mouth Two Times A Day 11)  Glipizide 10 Mg Tabs (Glipizide) .... Take One Tablet Two Times A Day. 12)  Metformin Hcl 1000 Mg Tabs (Metformin Hcl) .... Take One Tablet Two Times A Day. 13)  Lipitor 80 Mg Tabs (Atorvastatin Calcium) .... Take One Tablet Daily For Cholesterol. 14)  Cymbalta 60 Mg Cpep (Duloxetine Hcl) .... Take 1 Tablet By Mouth Once A Day 15)  Klor-Con 20 Meq Pack (Potassium Chloride) .... Take 1 Tablet By Mouth Once A Day 16)  Lisinopril 40 Mg Tabs (Lisinopril) .... Take 1 Tablet By Mouth Every Morning 17)  Embrace Blood Glucose Test  Strp (Glucose Blood) .... Check Blood Sugars Four Times Daily. 18)  Clotrimazole 1 % Crea (Clotrimazole) .... Apply To Affected Area Three Times A Day 19)  Advocate Test  Strp (Glucose Blood) .... Use To Check Blood Sugar Before Meals and Bedtime 4x Eachday 20)  Humalog Kwikpen 100 Unit/ml Soln (Insulin Lispro (Human)) .... Blood Sugar Between 150-200 Use 2 Units, 201-250 = 4 Units,  251-300 = 6 Units, 301-350 = 8 Units, 351-400 = 10 Units, For >400 Use 12 Units & Call Dr.up To 40 Units/d 21)  Pen Needles 1/2" 29g X 52mm Misc (Insulin Pen Needle) .... Use To Inject Mealtime Insulin 22)  Onetouch Delica Lancets  Misc (Lancets) .... Use To Check Blood Sugar 2-3x Daily( 250.00) 23)  Onetouch Ultra  Test  Strp (Glucose Blood) .... Use To Check Blood Sugar 2-3x A Day: At [facility] Before Breakfast and  Before Dinner ( 250.02) 24)  Fluconazole 150 Mg Tabs (Fluconazole) .... Take 1 Tablet By Mouth Once A Day X1 25)  Hydrocortisone 1 % Crea (Hydrocortisone) .... Apply Thin Layer To Rash Bid  Allergies (verified): No Known Drug Allergies  Past History:  Past Medical History: Last updated: 01/31/2008 Diabetes mellitus, type II Gout Hyperlipidemia Hypertension obstructive sleep apnea/obesity hypoventilation syndrome benign prostatic hypertrophy morbid obesity Hypertensive cardiomyopathy, diastolic dysfuncion by [id number] Guadalupe Guerra 2-vessel Nonobstructive Coronary artery disease- cath 3/05; 60% LAD, 30% Cx pacemaker 2005 for bradycarida legally blind umbilical hernia herpes Heme positive stool 8/07 referred to [facility] for colonoscopy  Degenerative disease: Right knee  Family History: Last updated: 11/07/2008 whole family with coronary artery disease and htn   Social History: Last updated: 11/07/2008 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 whole family with coronary artery disease and htn   Social History: Reviewed history from 11/07/2008 and no changes required. 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  The patient denies fever,  chest pain, syncope, dyspnea on exertion, prolonged cough, headaches, hemoptysis, abdominal pain, melena, and hematochezia.    Physical Exam  General:  alert, well-developed, well-nourished, well-hydrated,  and overweight-appearing.   Head:  normocephalic.   Nose:  no nasal discharge.   Mouth:  pharynx pink and moist.   Neck:  supple.   Lungs:  normal respiratory effort, no accessory muscle use, normal breath sounds, no crackles, and no wheezes.   Heart:  normal rate, regular rhythm, no rub, no JVD, and grade  3/6 systolic murmur.   Abdomen:  soft, non-tender, normal bowel sounds, and no distention.   Msk:  normal ROM, no joint tenderness, no joint swelling, and no joint warmth.   Pulses:  2+ Extremities:  1+ left pedal edema and 1+ right pedal edema.   Neurologic:  alert & oriented X3 and gait normal.    Diabetes Management Exam:    Foot Exam (with socks and/or shoes not present):       Sensory-Monofilament:          Left foot: abnormal          Right foot: abnormal   Impression & Recommendations:  Problem # 1:  HYPERTENSION (ICD-401.9) Assessment Unchanged He has poorly controlled BP, even on 5 meds, aymptomatic. Concerned with medical  compliance and addressed him the importance of adherence. He reported that he has been taking all his meds. Will add flomax as he has BPH with difficult urination. Will recheck BP at next visit.  His updated medication list for this problem includes:    Norvasc 10 Mg Tabs (Amlodipine besylate) .Marland Kitchen... Take one tablet daily for blood pressure.    Catapres 0.3 Mg Tabs (Clonidine hcl) .Marland Kitchen... Take one tablet twice a day.    Coreg 25 Mg Tabs (Carvedilol) .Marland Kitchen... Take one tablet twice a day.    Furosemide 40 Mg Tabs (Furosemide) .Marland Kitchen... Take 1 tablet by mouth two times a day    Lisinopril 40 Mg Tabs (Lisinopril) .Marland Kitchen... Take 1 tablet by mouth every morning  BP today: 176/97 Prior BP: 182/104 (03/25/2010)  Prior 10 Yr Risk Heart Disease: N/A (01/04/2007)  Labs Reviewed: K+: 4.1 (06/29/2010) Creat: : 1.12 (06/29/2010)   Chol: 171 (06/29/2010)   HDL: 32 (06/29/2010)   LDL: 118 (06/29/2010)   TG: 107 (06/29/2010)  Problem # 2:  DIABETES MELLITUS, TYPE II  (ICD-250.00) Assessment: Deteriorated  His CBG still poorly controlled, still concerned with non-compliance with A1C 14.6 on high dose lantus. Will increase lantus to 80 units. Asked him to bring glucometer with him at next visit. Also advised him weight loss, healthy diet and exercise.  His updated medication list for this problem includes:    Aspirin 81 Mg Tbec (Aspirin) .Marland Kitchen... Take 1 tablet by mouth once a day    Lantus Solostar 100 Unit/ml Soln (Insulin glargine) ..... Inject 80 units subcutaneously daily from solostar injector pen    Glipizide 10 Mg Tabs (Glipizide) .Marland Kitchen... Take one tablet two times a day.    Metformin Hcl 1000 Mg Tabs (Metformin hcl) .Marland Kitchen... Take one tablet two times a day.    Lisinopril 40 Mg Tabs (Lisinopril) .Marland Kitchen... Take 1 tablet by mouth every morning    Humalog Kwikpen 100 Unit/ml Soln (Insulin lispro (human)) ..... Blood sugar between 150-200 use 2 units, 201-250 = 4 units,  251-300 = 6 units, 301-350 = 8 units, 351-400 = 10 units, for >400 use 12 units & call dr.up to 40 units/d  Orders: Capillary Blood Glucose/CBG [id number])  Labs Reviewed: Creat: 1.12 (06/29/2010)    Reviewed HgBA1c results: 14.6 (06/29/2010)  11.9 (03/25/2010)  Problem # 3:  HYPERLIPIDEMIA (ICD-272.4) Assessment: Improved Will continue statin and seems improved. His updated medication list for this problem includes:    Lipitor 80 Mg Tabs (Atorvastatin calcium) .Marland Kitchen... Take one tablet daily for cholesterol.  Labs Reviewed: SGOT: 18 (06/29/2010)   SGPT: 26 (06/29/2010)  Lipid Goals: Chol Goal: 200 (03/06/2008)   HDL Goal: 40 (03/06/2008)   LDL Goal: 70 (03/06/2008)   TG Goal: 150 (03/06/2008)  Prior 10 Yr Risk Heart Disease: N/A (01/04/2007)   HDL:32 (06/29/2010), 35 (05/15/2009)  LDL:118 (06/29/2010), 135 (05/15/2009)  Chol:171 (06/29/2010), 214 (05/15/2009)  Trig:107 (06/29/2010), 222 (05/15/2009)  Problem # 4:  BENIGN PROSTATIC HYPERTROPHY, HX OF (ICD-V13.8) Assessment: Unchanged Still has  BPH symptom and will add flomax.   Complete Medication List: 1)  Colcrys 0.6 Mg Tabs (Colchicine) .... Take 1 tablet by mouth once a day as needed for gout 2)  Norvasc 10 Mg Tabs (Amlodipine besylate) .... Take one tablet daily for blood pressure. 3)  Catapres 0.3 Mg Tabs (Clonidine hcl) .... Take one tablet twice a day. 4)  Prilosec 20 Mg Cpdr (Omeprazole) .... Take one tablet daily. 5)  Aspirin 81 Mg Tbec (Aspirin) .... Take 1 tablet by mouth once a day 6)  Combivent 103-18 Mcg/act Aero (Ipratropium-albuterol) .... Inhale 1-2 puff every 6 hours as needed for shortness of breath. 7)  Coreg 25 Mg Tabs (Carvedilol) .... Take one tablet twice a day. 8)  Lantus Solostar 100 Unit/ml Soln (Insulin glargine) .... Inject 80 units subcutaneously daily from solostar injector pen 9)  Pen Needles 31g X 8 Mm Misc (Insulin pen needle) .... Use with lantus and humalog to inject insulin 5x a day ( 250.00) 10)  Furosemide 40 Mg Tabs (Furosemide) .... Take 1 tablet by mouth two times a day 11)  Glipizide 10 Mg Tabs (Glipizide) .... Take one tablet two times a day. 12)  Metformin Hcl 1000 Mg Tabs (Metformin hcl) .... Take one tablet two times a day. 13)  Lipitor 80 Mg Tabs (Atorvastatin calcium) .... Take one tablet daily for cholesterol. 14)  Cymbalta 60 Mg Cpep (Duloxetine hcl) .... Take 1 tablet by mouth once a day 15)  Klor-con 20 Meq Pack (Potassium chloride) .... Take 1 tablet by mouth once a day 16)  Lisinopril 40 Mg Tabs (Lisinopril) .... Take 1 tablet by mouth every morning 17)  Embrace Blood Glucose Test Strp (Glucose blood) .... Check blood sugars four times daily. 18)  Clotrimazole 1 % Crea (Clotrimazole) .... Apply to affected area three times a day 19)  Advocate Test Strp (Glucose blood) .... Use to check blood sugar before meals and bedtime 4x eachday 20)  Humalog Kwikpen 100 Unit/ml Soln (Insulin lispro (human)) .... Blood sugar between 150-200 use 2 units, 201-250 = 4 units,  251-300 = 6 units,  301-350 = 8 units, 351-400 = 10 units, for >400 use 12 units & call dr.up to 40 units/d 21)  Pen Needles 1/2" 29g X 58mm Misc (Insulin pen needle) .... Use to inject mealtime insulin 22)  Onetouch Delica Lancets Misc (Lancets) .... Use to check blood sugar 2-3x daily( 250.00) 23)  Onetouch Ultra Test Strp (Glucose blood) .... Use to check blood sugar 2-3x a day: at least before breakfast and  before dinner ( 250.02) 24)  Fluconazole 150 Mg Tabs (Fluconazole) .... Take 1 tablet by mouth once a day x1 25)  Hydrocortisone 1 %  Crea (Hydrocortisone) .... Apply thin layer to rash bid 26)  Flomax 0.4 Mg Caps (Tamsulosin hcl) .... Take 1 tablet by mouth once a day  Patient Instructions: 1)  Please schedule a follow-up appointment in 1 month. 2)  Please bring your glucometer with you at next office visit.  3)  Please take your medication as instructed.  4)  It is important that you exercise regularly at least 20 minutes 5 times a week. If you develop chest pain, have severe difficulty breathing, or feel very tired , stop exercising immediately and seek medical attention. 5)  You need to lose weight. Consider a lower calorie diet and regular exercise.  6)  Check your blood sugars regularly. If your readings are usually above : or below 70 you should contact our office. 7)  Check your feet each night for sore areas, calluses or signs of infection. Prescriptions: FLOMAX 0.4 MG CAPS (TAMSULOSIN HCL) Take 1 tablet by mouth once a day  #90 x 2   Entered and Authorized by:   Geanie Kenning MD   Signed by:   Geanie Kenning MD on 07/07/2010   Method used:   Print then Give to Patient   RxID:   [id number]    Orders Added: 1)  Capillary Blood Glucose/CBG [82948] 2)  Est. Patient Level IV [id number]    Prevention & Chronic Care Immunizations   Influenza vaccine: Fluvax 3+  (05/15/2009)   Influenza vaccine deferral: Refused  (07/07/2010)   Influenza vaccine due: 04/16/2010    Tetanus booster:  03/25/2010: Tdap   Tetanus booster due: 03/25/2020    Pneumococcal vaccine: Pneumovax  (08/24/2008)   Pneumococcal vaccine due: 03/17/2015    H. zoster vaccine: Not documented   H. zoster vaccine deferral: Not available  (03/25/2010)  Colorectal Screening   Hemoccult: Positive  (04/20/2006)   Hemoccult action/deferral: Ordered  (03/25/2010)    Colonoscopy: Not documented   Colonoscopy action/deferral: GI referral  (05/15/2009)  Other Screening   PSA: Not documented   PSA action/deferral: Discussion deferred  (11/28/2009)   Smoking status: never  (07/07/2010)  Diabetes Mellitus   HgbA1C: 14.6  (06/29/2010)   HgbA1C action/deferral: Ordered  (03/25/2010)   Hemoglobin A1C due: 06/25/2010    Eye exam: Not documented   Diabetic eye exam action/deferral: Ophthalmology referral  (03/25/2010)    Foot exam: yes  (07/07/2010)   Foot exam action/deferral: Do today   High risk foot: Yes  (07/07/2010)   Foot care education: Done  (03/25/2010)   Foot exam due: 07/13/2011    Urine microalbumin/creatinine ratio: 237.5  (03/25/2010)   Urine microalbumin action/deferral: Ordered   Urine microalbumin/cr due: 03/26/2011  Lipids   Total Cholesterol: 171  (06/29/2010)   Lipid panel action/deferral: Lipid Panel ordered   LDL: 118  (06/29/2010)   LDL Direct: Not documented   HDL: 32  (06/29/2010)   Triglycerides: 107  (06/29/2010)   Lipid panel due: 04/25/2010    SGOT (AST): 18  (06/29/2010)   BMP action: Ordered   SGPT (ALT): 26  (06/29/2010)   Alkaline phosphatase: 117  (06/29/2010)   Total bilirubin: 0.8  (06/29/2010)    Lipid flowsheet reviewed?: Yes   Progress toward LDL goal: Improved  Hypertension   Last Blood Pressure: 176 / 97  (07/07/2010)   Serum creatinine: 1.12  (06/29/2010)   BMP action: Ordered   Serum potassium 4.1  (0000000)   Basic metabolic panel due: [id number]    Hypertension flowsheet reviewed?: Yes   Progress toward BP goal:  Unchanged  Self-Management Support :   Personal Goals (by the next clinic visit) :     Personal A1C goal: 7  (05/15/2009)     Personal blood pressure goal: 130/80  (06/09/2009)     Personal LDL goal: 100  (05/15/2009)    Patient will work on the following items until the next clinic visit to reach self-care goals:     Medications and monitoring: take my medicines every day, check my blood sugar, bring all of my medications to every visit, examine my feet every day  (07/07/2010)     Eating: drink diet soda or water instead of juice or soda, eat more vegetables, use fresh or frozen vegetables, eat foods that are low in salt, eat baked foods instead of fried foods, eat fruit for snacks and desserts, limit or avoid alcohol  (07/07/2010)     Activity: take a 30 minute walk every day  (07/07/2010)    Diabetes self-management support: Written self-care plan, Education handout, Pre-printed educational material, Resources for patients handout  (07/07/2010)   Diabetes care plan printed   Diabetes education handout printed   Last diabetes self-management training by diabetes educator: 01/26/2010    Hypertension self-management support: Written self-care plan, Education handout, Pre-printed educational material, Resources for patients handout  (07/07/2010)   Hypertension self-care plan printed.   Hypertension education handout printed    Lipid self-management support: Written self-care plan, Education handout, Pre-printed educational material, Resources for patients handout  (07/07/2010)   Lipid self-care plan printed.   Lipid education handout printed      Resource handout printed.   Nursing Instructions: Diabetic foot exam today    Last LDL:                                                 118 (06/29/2010 1:51:53 PM)        Diabetic Foot Exam Pulse Check          Right Foot          Left Foot Posterior Tibial:        normal            normal Dorsalis Pedis:        normal             normal  High Risk Feet? Yes Set Next Diabetic Foot Exam here: 07/13/2011   10-g (5.07) Semmes-Weinstein Monofilament Test Performed by: Ryan Nephew RN          Right Foot          Left Foot Visual Inspection

## 2010-09-17 NOTE — Progress Notes (Signed)
Summary: Refill/gh  Phone Note Refill Request Message from:  Fax from Pharmacy on April 06, 2010 9:30 AM  Refills Requested: Medication #1:  LANTUS SOLOSTAR 100 UNIT/ML  SOLN inject 75 units subcutaneously daily from solostar injector pen Pharmacy needs prescription for 90 days.   Method Requested: Electronic Initial call taken by: Sander Nephew RN,  April 06, 2010 9:31 AM  Follow-up for Phone Call        completed refill, thank you Iskra  Follow-up by: [name] MD,  April 07, 2010 2:10 PM    Prescriptions: LANTUS SOLOSTAR 100 UNIT/ML  SOLN (INSULIN GLARGINE) inject 75 units subcutaneously daily from solostar injector pen  #1 x 11   Entered and Authorized by:   [name] MD   Signed by:   [name] MD on 04/07/2010   Method used:   Electronically to        [facility]* (retail)       [place]  [address]       Ph: [phone]       Fax: [phone]   RxID:   [id number]

## 2010-09-17 NOTE — Progress Notes (Signed)
Summary: Refill/gh  Phone Note Refill Request Message from:  Fax from Pharmacy on March 23, 2010 3:37 PM  Refills Requested: Medication #1:  KLOR-CON 20 MEQ PACK Take 1 tablet by mouth once a day  Medication #2:  LISINOPRIL 20 MG TABS Take 1 tablet by mouth once a day  Medication #3:  HUMALOG KWIKPEN 100 UNIT/ML SOLN blood sugar between 150-200 use 2 units Pt request 6 month refills for mail order.   Method Requested: Fax to [facility] Initial call taken by: Sander Nephew RN,  March 23, 2010 3:38 PM  Follow-up for Phone Call        completed refill, thank you Iskra  Follow-up by: [name] MD,  March 24, 2010 1:50 PM

## 2010-09-17 NOTE — Progress Notes (Signed)
Summary: Refill/gh  Phone Note Refill Request Message from:  Patient on March 23, 2010 3:31 PM  Refills Requested: Medication #1:  COLCRYS 0.6 MG TABS Take 1 tablet by mouth once a day as needed for gout  Medication #2:  NORVASC 10 MG TABS Take one tablet daily for blood pressure.  Medication #3:  CATAPRES 0.3 MG TABS Take one tablet twice a day.  Medication #4:  PRILOSEC 20 MG CPDR Take one tablet daily. Wants 60 month refills for the [employer].   Method Requested: Fax to [facility] Initial call taken by: Sander Nephew RN,  March 23, 2010 3:31 PM  Follow-up for Phone Call        completed refill, thank you Iskra  Follow-up by: [name] MD,  March 24, 2010 1:48 PM    Prescriptions: PRILOSEC 20 MG CPDR (OMEPRAZOLE) Take one tablet daily.  #96 x 4   Entered and Authorized by:   [name] MD   Signed by:   [name] MD on 03/24/2010   Method used:   Faxed to ...       [facility] (mail-order)             , [place]         Ph: [phone]       Fax: [phone]   RxID:   [id number] CATAPRES 0.3 MG TABS (CLONIDINE HCL) Take one tablet twice a day.  #190 x 4   Entered and Authorized by:   [name] MD   Signed by:   [name] MD on 03/24/2010   Method used:   Faxed to ...       [facility] (mail-order)             , [place]         Ph: [phone]       Fax: [phone]   RxID:   [id number] NORVASC 10 MG TABS (AMLODIPINE BESYLATE) Take one tablet daily for blood pressure.  #96 x 4   Entered and Authorized by:   [name] MD   Signed by:   [name] MD on 03/24/2010   Method used:   Faxed to ...       [facility] (mail-order)             , [place]         Ph: [phone]       Fax: [phone]   RxID:   [id number] COLCRYS 0.6 MG TABS (COLCHICINE) Take 1 tablet by mouth once a day as needed for gout  #30 x 6   Entered and Authorized by:   [name] MD   Signed by:   [name] MD on 03/24/2010  Method used:   Faxed to ...       [facility] (mail-order)             , [place]         Ph: [phone]       Fax: [phone]   RxID:   [id number] PRILOSEC 20 MG CPDR (OMEPRAZOLE) Take one tablet daily.  #96 x 4   Entered and Authorized by:   [name] MD   Signed by:   [name] MD on 03/24/2010   Method used:   Print then Give to Patient   [id number] CATAPRES 0.3 MG TABS (CLONIDINE HCL) Take one tablet twice a day.  #190 x 4   Entered and Authorized  by:   [name] MD   Signed by:   [name] MD on 03/24/2010   Method used:   Print then Give to Patient   RxID:   [id number] NORVASC 10 MG TABS (AMLODIPINE BESYLATE) Take one tablet daily for blood pressure.  #96 x 4   Entered and Authorized by:   [name] MD   Signed by:   [name] MD on 03/24/2010   Method used:   Print then Give to Patient   [id number] COLCRYS 0.6 MG TABS (COLCHICINE) Take 1 tablet by mouth once a day as needed for gout  #30 x 6   Entered and Authorized by:   [name] MD   Signed by:   [name] MD on 03/24/2010   Method used:   Print then Give to Patient   RxID:   [id number]

## 2010-09-17 NOTE — Letter (Signed)
Summary: BLOOD GLUCOSE LOG  BLOOD GLUCOSE LOG   Imported By: Garlan Fillers 12/01/2009 14:31:22  _____________________________________________________________________  External Attachment:    Type:   Image     Comment:   External Document

## 2010-09-17 NOTE — Progress Notes (Signed)
Summary: wheezing/ hla  Phone Note Other Incoming   Summary of Call: pt presents to see d riley for diab ed. c/o wheezing, sneezing, congestion. sclera red especially on R side. also c/o shortness of breath but has no problem stating several sentences at a time. it is audible that he has inspir and expir wheezing mild to mod. denies fevers. states he has used albuterol and cpap w/ no improvement. exercise makes worse. appt given for 3pm today and pt is informed if he has immediate needs he may go to urg care or ed.  Initial call taken by: Freddy Finner RN,  December 08, 2009 2:45 PM

## 2010-09-17 NOTE — Progress Notes (Signed)
Summary: med refil/gp  Phone Note Refill Request Message from:  Fax from Pharmacy on August 21, 2009 11:08 AM  Refills Requested: Medication #1:  CYMBALTA 60 MG CPEP Take 1 tablet by mouth once a day  Method Requested: Electronic Initial call taken by: Morrison Old RN,  August 21, 2009 11:08 AM    Prescriptions: CYMBALTA 60 MG CPEP (DULOXETINE HCL) Take 1 tablet by mouth once a day  #32 x 6   Entered and Authorized by:   Myrtis Ser MD   Signed by:   Myrtis Ser MD on 08/21/2009   Method used:   Electronically to        [facility]* (retail)       [place]  [address]       Ph: [phone]       Fax: [phone]   RxID:   [id number]

## 2010-09-17 NOTE — Assessment & Plan Note (Signed)
Summary: ACUTE-MALE PROBLEMS/HURTING/SENSITIVE TO TOUCH/CFB([facility])   Vital Signs:  Patient profile:   65 year old male Height:      72 inches (182.88 cm) Weight:      316.3 pounds (147.00 kg) BMI:     43.05 Temp:     97.1 degrees F (36.17 degrees C) oral Pulse rate:   76 / minute BP sitting:   192 / 103  (right arm) Cuff size:   XLARGE  Vitals Entered By: Lucky Rathke NT II (October 29, 2009 2:25 PM) CC: MALE PROBLEM ? CHECK LEGS DICOLORATION Is Patient Diabetic? Yes Did you bring your meter with you today? No Pain Assessment Patient in pain? no      Nutritional Status BMI of > 30 = obese CBG Result 328  Have you ever been in a relationship where you felt threatened, hurt or afraid?No   Does patient need assistance? Functional Status Self care Ambulation Normal Comments MALE PROBLEM   /  BILATERAL LEG DISCOLORATION   Primary Care Provider:  Myrtis Ser MD  CC:  MALE PROBLEM ? CHECK LEGS DICOLORATION.  History of Present Illness: [name] is a 65 year old Male with PMH/problems as outlined in the EMR, who presents to the [facility] with chief complaint(s) of:    -- swelling around the tip of penis; started few days ago, he has been having this problem whenever he uses swimming pool. no discharge, no sexual activity in the past one year, some dysuria but no abdominal pain, urethral discharge, fever chills or any other systemic complaints.   Depression History:      The patient denies a depressed mood most of the day and a diminished interest in his usual daily activities.         Preventive Screening-Counseling & Management  Alcohol-Tobacco     Alcohol drinks/day: 0     Smoking Status: never     Passive Smoke Exposure: no  Caffeine-Diet-Exercise     Does Patient Exercise: yes     Type of exercise: NO  Current Medications (verified): 1)  Colcrys 0.6 Mg Tabs (Colchicine) .... Take 1 Tablet By Mouth Once A Day As Needed For Gout 2)  Norvasc 10 Mg Tabs  (Amlodipine Besylate) .... Take One Tablet Daily For Blood Pressure. 3)  Catapres 0.3 Mg Tabs (Clonidine Hcl) .... Take One Tablet Twice A Day. 4)  Prilosec 20 Mg Cpdr (Omeprazole) .... Take One Tablet Daily. 5)  Aspirin 81 Mg Tbec (Aspirin) .... Take 1 Tablet By Mouth Once A Day 6)  Combivent 103-18 Mcg/act Aero (Ipratropium-Albuterol) .... Inhale 1-2 Puff Every 6 Hours As Needed For Shortness of Breath. 7)  Coreg 25 Mg  Tabs (Carvedilol) .... Take One Tablet Twice A Day. 8)  Lantus Solostar 100 Unit/ml  Soln (Insulin Glargine) .... Inject 80 Units Subcutaneously Daily From Solostar Injector Pen 9)  Pen Needles 31g X 8 Mm  Misc (Insulin Pen Needle) .... Use With Lantus Solostar To Inject Insulin Twice Daily 10)  Furosemide 40 Mg Tabs (Furosemide) .... Take 1 Tablet By Mouth Two Times A Day 11)  Glipizide 10 Mg Tabs (Glipizide) .... Take One Tablet Two Times A Day. 12)  Metformin Hcl 1000 Mg Tabs (Metformin Hcl) .... Take One Tablet Two Times A Day. 13)  Lipitor 80 Mg Tabs (Atorvastatin Calcium) .... Take One Tablet Daily For Cholesterol. 14)  Cymbalta 60 Mg Cpep (Duloxetine Hcl) .... Take 1 Tablet By Mouth Once A Day 15)  Klor-Con 20 Meq Pack (Potassium Chloride) .Marland KitchenMarland KitchenMarland Kitchen  Take 1 Tablet By Mouth Once A Day 16)  Lisinopril 20 Mg Tabs (Lisinopril) .... Take 1 Tablet By Mouth Once A Day 17)  Embrace Blood Glucose Test  Strp (Glucose Blood) .... Check Blood Sugars Four Times Daily. 18)  Bactrim Ds 800-160 Mg Tabs (Sulfamethoxazole-Trimethoprim) .... Take 1 Tablet By Mouth Two Times A Day For Seven Days 19)  Clotrimazole 1 % Crea (Clotrimazole) .... Apply To Affected Area Three Times A Day  Allergies (verified): No Known Drug Allergies  Past History:  Past Medical History: Last updated: 01/31/2008 Diabetes mellitus, type II Gout Hyperlipidemia Hypertension obstructive sleep apnea/obesity hypoventilation syndrome benign prostatic hypertrophy morbid obesity Hypertensive cardiomyopathy,  diastolic dysfuncion by [id number] ECHOl 2-vessel Nonobstructive Coronary artery disease- cath 3/05; 60% LAD, 30% Cx pacemaker 2005 for bradycarida legally blind umbilical hernia herpes Heme positive stool 8/07 referred to [facility] for colonoscopy  Degenerative disease: Right knee  Family History: Last updated: 11/07/2008 whole family with coronary artery disease and htn   Social History: Last updated: 11/07/2008 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 Marland Kitchen   Physical Exam  General:  alert and overweight-appearing.   Eyes:  pupils round and pupils reactive to light.   Mouth:  pharynx pink and moist and no erythema.   Lungs:  normal respiratory effort and normal breath sounds.   Heart:  normal rate and regular rhythm.   Abdomen:  soft and non-tender.   Genitalia:  swollen prepuce, no urethral discharge Pulses:  normal peripheral pulses  Extremities:  no cyanosis, clubbing or edema  Neurologic:  non focal Inguinal Nodes:  none   Impression & Recommendations:  Problem # 1:  BALANITIS (ICD-607.1) exam consistent with balanitis, will treat with topical clotrimazole; i will also give empiric bactrim for dysuria.  Orders: T-Urinalysis [id number])  Problem # 2:  DIABETES MELLITUS, TYPE II (ICD-250.00) Data reviewed: A1c: 12.8 (10/29/2009 2:14:17 PM)  MICROALB/CR: 401.2 (12/16/2008 10:46:00 PM) LDL: 135 (05/15/2009 8:55:00 PM) FOOT: yes (11/07/2008 3:34:24 PM) WEIGHT: 43.05 (10/29/2009 2:14:17 PM)  I went over his high A1c with the patient. He plans to be more careful with his meals. I have asked him to come back with his glucose meter so that we can make changes to his  regimen.  His updated medication list for this problem includes:    Aspirin 81 Mg Tbec (Aspirin) .Marland Kitchen... Take 1 tablet by mouth once a day    Lantus Solostar 100 Unit/ml Soln (Insulin glargine) ..... Inject 80 units subcutaneously daily from solostar injector pen    Glipizide 10 Mg Tabs (Glipizide) .Marland Kitchen... Take one tablet two times a day.    Metformin Hcl 1000 Mg Tabs (Metformin hcl) .Marland Kitchen... Take one tablet two times a day.    Lisinopril 20 Mg Tabs (Lisinopril) .Marland Kitchen... Take 1 tablet by mouth once a day  Orders: T- Capillary Blood Glucose ([id number]) T-Hgb A1C (in-house) [id number])  Problem # 3:  HYPERTENSION (ICD-401.9) High bp noted and confirmed manually, i am going to increase his lisinopril to 20 mg once a day.  His updated medication list for this problem includes:    Norvasc 10 Mg Tabs (Amlodipine besylate) .Marland Kitchen... Take one tablet daily for blood pressure.    Catapres 0.3 Mg Tabs (  Clonidine hcl) .Marland Kitchen... Take one tablet twice a day.    Coreg 25 Mg Tabs (Carvedilol) .Marland Kitchen... Take one tablet twice a day.    Furosemide 40 Mg Tabs (Furosemide) .Marland Kitchen... Take 1 tablet by mouth two times a day    Lisinopril 20 Mg Tabs (Lisinopril) .Marland Kitchen... Take 1 tablet by mouth once a day  Problem # 4:  HYPERLIPIDEMIA (ICD-272.4) Continue current regimen. Data reviewed: Chol: 214 (05/15/2009 8:55:00 PM)HDL:  35 (05/15/2009 8:55:00 PM)LDL:  135 (05/15/2009 8:55:00 PM)Tri:   AST:  14 (05/15/2009 8:55:00 PM) ALT:  31 (05/15/2009 8:55:00 PM)T. Bili:  0.5 (05/15/2009 8:55:00 PM) AP:  127 (05/15/2009 8:55:00 PM)   His updated medication list for this problem includes:    Lipitor 80 Mg Tabs (Atorvastatin calcium) .Marland Kitchen... Take one tablet daily for cholesterol.  Problem # 5:  CARDIOMYOPATHY (ICD-425.4) stable, clinically, no changes today.   Complete Medication List: 1)  Colcrys 0.6 Mg Tabs (Colchicine) .... Take 1 tablet by mouth once a day as needed for gout 2)  Norvasc 10 Mg Tabs (Amlodipine besylate) .... Take one tablet daily for  blood pressure. 3)  Catapres 0.3 Mg Tabs (Clonidine hcl) .... Take one tablet twice a day. 4)  Prilosec 20 Mg Cpdr (Omeprazole) .... Take one tablet daily. 5)  Aspirin 81 Mg Tbec (Aspirin) .... Take 1 tablet by mouth once a day 6)  Combivent 103-18 Mcg/act Aero (Ipratropium-albuterol) .... Inhale 1-2 puff every 6 hours as needed for shortness of breath. 7)  Coreg 25 Mg Tabs (Carvedilol) .... Take one tablet twice a day. 8)  Lantus Solostar 100 Unit/ml Soln (Insulin glargine) .... Inject 80 units subcutaneously daily from solostar injector pen 9)  Pen Needles 31g X 8 Mm Misc (Insulin pen needle) .... Use with lantus solostar to inject insulin twice daily 10)  Furosemide 40 Mg Tabs (Furosemide) .... Take 1 tablet by mouth two times a day 11)  Glipizide 10 Mg Tabs (Glipizide) .... Take one tablet two times a day. 12)  Metformin Hcl 1000 Mg Tabs (Metformin hcl) .... Take one tablet two times a day. 13)  Lipitor 80 Mg Tabs (Atorvastatin calcium) .... Take one tablet daily for cholesterol. 14)  Cymbalta 60 Mg Cpep (Duloxetine hcl) .... Take 1 tablet by mouth once a day 15)  Klor-con 20 Meq Pack (Potassium chloride) .... Take 1 tablet by mouth once a day 16)  Lisinopril 20 Mg Tabs (Lisinopril) .... Take 1 tablet by mouth once a day 17)  Embrace Blood Glucose Test Strp (Glucose blood) .... Check blood sugars four times daily. 18)  Bactrim Ds 800-160 Mg Tabs (Sulfamethoxazole-trimethoprim) .... Take 1 tablet by mouth two times a day for seven days 19)  Clotrimazole 1 % Crea (Clotrimazole) .... Apply to affected area three times a day  Patient Instructions: 1)  Please schedule an appointment to see Ms. Barnabas Harries for help with diabetes and nutrition education. Please bring in your glucose meter with you when you come to see her.  2)  Do let us know if your problem worsens.   3)  We will let you know if anything wrong with your lab work.   4)  Your lisinopril has been increased to 20 mg once a day. 5)   Please bring your pill bottle when you come next time.  Prescriptions: CLOTRIMAZOLE 1 % CREA (CLOTRIMAZOLE) apply to affected area three times a day  #1 x 0   Entered and Authorized by:   Dawna Part MD   Signed  by:   Dawna Part MD on 10/29/2009   Method used:   Electronically to        [facility]. [phone]* (retail)       [address], [place]  [address]       Ph: [phone] or [phone]       Fax: [phone]   RxID:   [id number] BACTRIM DS 800-160 MG TABS (SULFAMETHOXAZOLE-TRIMETHOPRIM) Take 1 tablet by mouth two times a day for seven days  #14 x 0   Entered and Authorized by:   Dawna Part MD   Signed by:   Dawna Part MD on 10/29/2009   Method used:   Electronically to        [facility]. [phone]* (retail)       [address] [place]  [address]       Ph: [phone] or [phone]       Fax: [phone]   RxID:   [id number] LISINOPRIL 20 MG TABS (LISINOPRIL) Take 1 tablet by mouth once a day  #30 x 6   Entered and Authorized by:   Dawna Part MD   Signed by:   Dawna Part MD on 10/29/2009   Method used:   Electronically to        [facility]* [place]  [address]       Ph: [phone]       Fax: [phone]   RxID:   [id number]  Process Orders Check Orders Results:     [facility]: ABN not required for this insurance Tests Sent for requisitioning (October 29, 2009 9:51 PM):     10/29/2009: [facility] -- T-Urinalysis [id number] (signed)    Prevention & Chronic Care Immunizations   Influenza vaccine: Fluvax 3+  (05/15/2009)    Tetanus booster: Not documented    Pneumococcal vaccine: Not documented    H. zoster vaccine: Not documented  Colorectal Screening   Hemoccult: Positive  (04/20/2006)    Colonoscopy: Not documented   Colonoscopy action/deferral: GI referral  (05/15/2009)  Other Screening   PSA: Not  documented   Smoking status: never  (10/29/2009)  Diabetes Mellitus   HgbA1C: 12.8  (10/29/2009)    Eye exam: Not documented    Foot exam: yes  (11/07/2008)   High risk foot: No  (01/03/2009)   Foot care education: Not documented    Urine microalbumin/creatinine ratio: 401.2  (12/16/2008)    Diabetes flowsheet reviewed?: Yes   Progress toward A1C goal: Unchanged  Lipids   Total Cholesterol: 214  (05/15/2009)   Lipid panel action/deferral: Lipid Panel ordered   LDL: 135  (05/15/2009)   LDL Direct: Not documented   HDL: 35  (05/15/2009)   Triglycerides: 222  (05/15/2009)    SGOT (AST): 14  (05/15/2009)   BMP action: Ordered   SGPT (ALT): 31  (05/15/2009)   Alkaline phosphatase: 127  (05/15/2009)   Total bilirubin: 0.5  (05/15/2009)    Lipid flowsheet reviewed?: Yes   Progress toward LDL goal: Unchanged  Hypertension   Last Blood Pressure: 192 / 103  (10/29/2009)   Serum creatinine: 1.25  (05/15/2009)   BMP action: Ordered   Serum potassium 4.1  (05/15/2009)    Hypertension flowsheet reviewed?: Yes  Self-Management Support :   Personal Goals (by the next  clinic visit) :     Personal A1C goal: 7  (05/15/2009)     Personal blood pressure goal: 130/80  (06/09/2009)     Personal LDL goal: 100  (05/15/2009)    Patient will work on the following items until the next clinic visit to reach self-care goals:     Medications and monitoring: take my medicines every day, check my blood sugar, bring all of my medications to every visit, examine my feet every day  (10/29/2009)     Eating: drink diet soda or water instead of juice or soda, eat more vegetables, use fresh or frozen vegetables, eat foods that are low in salt, eat baked foods instead of fried foods, eat fruit for snacks and desserts, limit or avoid alcohol  (10/29/2009)     Activity: take a 30 minute walk every day  (10/29/2009)    Diabetes self-management support: Resources for patients handout  (10/29/2009)     Hypertension self-management support: Resources for patients handout  (10/29/2009)    Lipid self-management support: Resources for patients handout  (10/29/2009)     Self-management comments: PATIENT HAS BEEN GOING TO THE GYN TO WORK OUT !!!      Resource handout printed.   Laboratory Results   Blood Tests   Date/Time Received: October 29, 2009 2:49 PM  Date/Time Reported: Lenoria Farrier  October 29, 2009 2:49 PM   HGBA1C: 12.8%   (Normal Range: Non-Diabetic - 3-6%   Control Diabetic - 6-8%) CBG Random:: 328mg /dL

## 2010-09-17 NOTE — Letter (Signed)
Summary: [facility]   Imported By: Garlan Fillers 11/26/2009 14:04:18  _____________________________________________________________________  External Attachment:    Type:   Image     Comment:   External Document

## 2010-09-17 NOTE — Miscellaneous (Signed)
Summary: [facility]: [employer]  [facility] [employer]   Imported By: Bonner Puna 11/05/2009 11:17:49  _____________________________________________________________________  External Attachment:    Type:   Image     Comment:   External Document

## 2010-09-17 NOTE — Letter (Signed)
Summary: [facility]  [facility]   Imported By: Enedina Finner 03/05/2010 15:52:13  _____________________________________________________________________  External Attachment:    Type:   Image     Comment:   External Document

## 2010-09-17 NOTE — Progress Notes (Signed)
Summary: Humalog and testing supplies Rx for [facility] mail order/dmr  Phone Note Outgoing Call   Call placed by: Barnabas Harries RD,CDE,  January 26, 2010 4:24 PM Summary of Call: Patient requested refills on cymbalta and Humalog:  note recent refill of Cymbalta, called [facility] about hUmalog- they need a new prescription, Cymbalta was mailed out today. They need the hard copy and to know if he is insulin requiring & diagnosis code for testing supplies. Orders entered for insulin open needles, lancets and strips for new meter- CDE will fax or call. Please sned back to my desktop. Patient gets better proce with 3 month prescription.   Follow-up for Phone Call        Thanks Butch Penny. Scripts signed and e-faxed. Let me know if you need any additional info. Follow-up by: Rhea Pink  DO,  January 27, 2010 4:32 PM    New/Updated Medications: PEN NEEDLES 31G X 8 MM  MISC (INSULIN PEN NEEDLE) use with lantus and Humalog to inject insulin 5x a day ( 250.00) HUMALOG KWIKPEN 100 UNIT/ML SOLN (INSULIN LISPRO (HUMAN)) blood sugar between 150-200 use 2 units, 201-250 = 4 units,  251-300 = 6 units, 301-350 = 8 units, 351-400 = 10 units, for >400 use 12 units & call Dr.Up to 40 units/d ONETOUCH DELICA LANCETS  MISC (LANCETS) use to check blood sugar 2-3x daily( 250.00) ONETOUCH ULTRA TEST  STRP (GLUCOSE BLOOD) use to check blood sugar 2-3x a day: at least before breakfast and  before dinner ( 250.02) [BMN] Prescriptions: CYMBALTA 60 MG CPEP (DULOXETINE HCL) Take 1 tablet by mouth once a day  #90 x 2   Entered and Authorized by:   Rhea Pink  DO   Signed by:   Rhea Pink  DO on 01/27/2010   Method used:   Faxed to ...       [facility] (mail-order)             , [place]         Ph: [phone]       Fax: [phone]   RxID:   [id number] ONETOUCH ULTRA TEST  STRP (GLUCOSE BLOOD) use to check blood sugar 2-3x a day: at least before breakfast and  before dinner ( 250.02) Brand medically necessary  #300 x 3   Entered and Authorized by:   Rhea Pink  DO   Signed by:   Rhea Pink  DO on 01/27/2010   Method used:   Faxed to ...       [facility] (mail-order)             , [place]         Ph: [phone]       Fax: [phone]   RxID:   [id number] Jonetta Speak LANCETS  MISC (LANCETS) use to check blood sugar 2-3x daily( 250.00)  #300 x 3   Entered and Authorized by:   Rhea Pink  DO   Signed by:   Rhea Pink  DO on 01/27/2010   Method used:   Faxed to ...       [facility] (mail-order)             , [place]         Ph: [phone]       Fax: [phone]   RxID:   [id number] HUMALOG KWIKPEN 100 UNIT/ML SOLN (INSULIN LISPRO (HUMAN)) blood sugar between 150-200 use 2 units, 201-250 = 4 units,  251-300 = 6 units, 301-350 =  8 units, 351-400 = 10 units, for >400 use 12 units & call Dr.Up to 40 units/d  #3 boxes x 0   Entered and Authorized by:   Rhea Pink  DO   Signed by:   Rhea Pink  DO on 01/27/2010   Method used:   Faxed to ...       [facility] (mail-order)             , [place]         Ph: [phone]       Fax: [phone]   RxID:   [id number] PEN NEEDLES 31G X 8 MM  MISC (INSULIN PEN NEEDLE) use with lantus and Humalog to inject insulin 5x a day ( 250.00)  #500 x 3   Entered and Authorized by:   Rhea Pink  DO   Signed by:   Rhea Pink  DO on 01/27/2010   Method used:   Faxed to ...       [facility] (mail-order)             , [place]         Ph: [phone]       Fax: [phone]   RxID:   [id number]

## 2010-09-17 NOTE — Letter (Signed)
Summary: BLOOD GLUCOSE 11-28-2009-01-17-2010  BLOOD GLUCOSE 11-28-2009-01-17-2010   Imported By: Garlan Fillers 02/23/2010 11:08:08  _____________________________________________________________________  External Attachment:    Type:   Image     Comment:   External Document

## 2010-09-17 NOTE — Medication Information (Signed)
Summary: [facility]   Imported By: Susette Racer 02/10/2010 11:46:29  _____________________________________________________________________  External Attachment:    Type:   Image     Comment:   External Document

## 2010-09-17 NOTE — Progress Notes (Signed)
Summary: new insulin and increased tsting supplies/dmr  Phone Note Outgoing Call   Call placed by: Barnabas Harries RD,CDE,  November 28, 2009 12:46 PM Summary of Call: called [facility]- they did not have record of recent fax to them for 4x/day testing- refaxed the completed form today. called to verfiy they had received fax. they wil send new shipement out on Monday for increased testing.  Follow-up for Phone Call        called [employer] to find out which rapid acting insulin pens are on  their formulary- both Novolog Flexpen and Humalog Claiborne Rigg are on tier 3 of 4 on formulary with [employer]. It wil cost him $6.30  Follow-up by: Barnabas Harries RD,CDE,  November 28, 2009 2:22 PM

## 2010-09-17 NOTE — Assessment & Plan Note (Signed)
Summary: congested, sneezing, red sclera x 4-5days/pcp-walsh/hla   Vital Signs:  Patient profile:   65 year old male Height:      72 inches Weight:      318.2 pounds BMI:     43.31 Temp:     97.7 degrees F oral Pulse rate:   86 / minute BP sitting:   191 / 113  (right arm)  Vitals Entered By: Silverio Decamp NT II (November 28, 2009 3:30 PM) CC: [place] Is Patient Diabetic? Yes Did you bring your meter with you today? Yes Pain Assessment Patient in pain? no      Nutritional Status BMI of > 30 = obese  Have you ever been in a relationship where you felt threatened, hurt or afraid?No   Does patient need assistance? Functional Status Self care Ambulation Normal   Primary Care Provider:  Myrtis Ser MD  CC:  [facility] WITH CHEST [place].  History of Present Illness: [name] is a 65 year old Male with PMH/problems as outlined in the EMR, who presents to the [facility] with chief complaint(s) of:  runny nose, cough, congestion, and itchy eyes for 5 days prior to his appointment.    He denies fever, chills, and sick contacts.  Admits to increased SOB, wheezing, and increased cough productive of clear, mucous sputum.  He has no hx of COPD or asthma but uses an albuterol inhaler as needed for SOB.  He does not smoke and cannot recall if he has ever had lung function testing.  He denies orthopnea, chest pain, headache, peripheral edema, dizziness, syncope and hemoptysis.  He has sleep apnea and reports using his CPAP at night as directed, however he has not been able to tolerate the CPAP well over the past 5 days 2/2 nasal congestion and drainage.  DM2:  Pt met with Barnabas Harries this am for review of CBGs and management of medications etc.  Pts blood sugars indicate a need for improved meal time coverage.  Pt is amenable to SSI at mealtimes.  He is currently taking Lantus, glipizide, and metformin.  He denies any hypoglycemic episodes.  HTN: Pts BP is elevated  today with SBP of 193.  He reports taking all of his medicines regularly.  Denies h/a, vision changes, CP and neuro defecits.  HLD: pt tolerating statin well.    He has no other concerns/complaints today.  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 Take 1 Tablet By Mouth Once A Day As Needed For Gout 2)  Norvasc 10 Mg Tabs (Amlodipine Besylate) .... Take One Tablet Daily For Blood Pressure. 3)  Catapres 0.3 Mg  Tabs (Clonidine Hcl) .... Take One Tablet Twice A Day. 4)  Prilosec 20 Mg Cpdr (Omeprazole) .... Take One Tablet Daily. 5)  Aspirin 81 Mg Tbec (Aspirin) .... Take 1 Tablet By Mouth Once A Day 6)  Combivent 103-18 Mcg/act Aero (Ipratropium-Albuterol) .... Inhale 1-2 Puff Every 6 Hours As Needed For Shortness of Breath. 7)  Coreg 25 Mg  Tabs (Carvedilol) .... Take One Tablet Twice A Day. 8)  Lantus Solostar 100 Unit/ml  Soln (Insulin Glargine) .... Inject 80 Units Subcutaneously Daily From Solostar Injector Pen 9)  Pen Needles 31g X 8 Mm  Misc (Insulin Pen Needle) .... Use With  Lantus Solostar To Inject Insulin Twice Daily 10)  Furosemide 40 Mg Tabs (Furosemide) .... Take 1 Tablet By Mouth Two Times A Day 11)  Glipizide 10 Mg Tabs (Glipizide) .... Take One Tablet Two Times A Day. 12)  Metformin Hcl 1000 Mg Tabs (Metformin Hcl) .... Take One Tablet Two Times A Day. 13)  Lipitor 80 Mg Tabs (Atorvastatin Calcium) .... Take One Tablet Daily For Cholesterol. 14)  Cymbalta 60 Mg Cpep (Duloxetine Hcl) .... Take 1 Tablet By Mouth Once A Day 15)  Klor-Con 20 Meq Pack (Potassium Chloride) .... Take 1 Tablet By Mouth Once A Day 16)  Lisinopril 20 Mg Tabs (Lisinopril) .... Take 1 Tablet By Mouth Once A Day 17)  Embrace Blood Glucose Test  Strp (Glucose Blood) .... Check Blood Sugars Four Times Daily. 18)  Bactrim Ds 800-160 Mg Tabs (Sulfamethoxazole-Trimethoprim) .... Take 1 Tablet By Mouth Two Times A Day For Seven Days 19)  Clotrimazole 1 % Crea (Clotrimazole) .... Apply To Affected Area Three Times A Day 20)  Advocate Test  Strp (Glucose Blood) .... Use To Check Blood Sugar Before Meals and Bedtime 4x Eachday 21)  Humalog Kwikpen 100 Unit/ml Soln (Insulin Lispro (Human)) .... Use As Directed By Your Pcp 22)  Doxycycline Hyclate 100 Mg Caps (Doxycycline Hyclate) .... Take One Pill Two Times A Day For 10 Days 23)  Prednisone 10 Mg Tabs (Prednisone) .... Take 3 Pills By Mouth Once Daily For 3 Days, Starting Today, Then Take 2 Pills Once Daily For 3 Days, Then 1 Pill By Mouth Once Daily For 3 Days 24)  Pen Needles 1/2" 29g X 49mm Misc (Insulin Pen Needle) .... Use To Inject Mealtime Insulin  Allergies (verified): No Known Drug Allergies  Past History:  Past medical, surgical, family and social histories (including risk factors) reviewed, and no changes noted (except as noted below).  Past Medical History: Reviewed history from 01/31/2008 and no changes required. Diabetes mellitus, type II Gout Hyperlipidemia Hypertension obstructive sleep apnea/obesity hypoventilation  syndrome benign prostatic hypertrophy morbid obesity Hypertensive cardiomyopathy, diastolic dysfuncion by [id number] ECHOl 2-vessel Nonobstructive Coronary artery disease- cath 3/05; 60% LAD, 30% Cx pacemaker 2005 for bradycarida legally blind umbilical hernia herpes Heme positive stool 8/07 referred to [place] for colonoscopy  Degenerative disease: Right knee  Family History: Reviewed history from 11/07/2008 and no changes required. whole family with coronary artery disease and htn   Social History: Reviewed history from 11/07/2008 and no changes required. 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  Physical Exam  General:  alert and overweight-appearing.  No acute distress. Head:  normocephalic and atraumatic.   Eyes:  vision grossly intact.  PERRLA.  +  conjunctival injection in R eye.  Sclerae and L conjunctiva are normal.  No drainage present Mouth:  pharynx pink and moist, no erythema, no exudates, poor dentition, and teeth missing.   Neck:  supple and no masses.   Lungs:  Pt audibly wheezing upon entering room.  He is able to speak in full sentances.  Ronchi and wheezing present bilaterally.  Prolonged expiratory phase.  No accessory muscle use.  No crackles. Heart:  Tachycardic and regular rhythm.  no murmur, no gallop, and no rub.   Abdomen:  soft, non-tender, and normal bowel sounds.   Extremities:  No clubbing, cyanosis, or edema. Neurologic:  alert & oriented X3.  No focal defecit. Skin:  turgor normal, color normal, and no rashes.   Psych:  Oriented X3, memory intact for recent and remote, normally interactive, good eye contact, not anxious appearing, and not depressed appearing.     Impression & Recommendations:  Problem # 1:  CHRONIC  OBSTRUCTIVE PULMONARY DISEASE, ACUTE EXACERBATION (ICD-491.21) Pt symptoms and hx are consistent with COPD exac: wheezing, prolonged expiratory phase with increase cough, increased sputum production, and shortness of breath.  He meets criteria for treatment with abx and steroid taper.  He has no known hx of obstuctive lung disease.  He will need PFTs in the future once his COPD exac is resolved.  Observed his use of albuterol inhaler; he has been using it incorrectly and has likely not been getting much benefit from it over the past few days.  Reviewed proper use with him and will provide him with a spacer to ensure the medicine is being delivered deep into his lungs.  WIll give 10 day course of doxy and 9 day 30mg  prednisone taper.  Advised him to return of go to the ER if his breathing significantly worsens.    Problem # 2:  HYPERTENSION (ICD-401.9)  BP elevated today; likely from increased stress 2/2 COPD exac.  Repeat SBP 163 after pt resting in room.  Will continue with current regimen and f/u at next appt.    His updated medication list for this problem includes:    Norvasc 10 Mg Tabs (Amlodipine besylate) .Marland Kitchen... Take one tablet daily for blood pressure.    Catapres 0.3 Mg Tabs (Clonidine hcl) .Marland Kitchen... Take one tablet twice a day.    Coreg 25 Mg Tabs (Carvedilol) .Marland Kitchen... Take one tablet twice a day.    Furosemide 40 Mg Tabs (Furosemide) .Marland Kitchen... Take 1 tablet by mouth two times a day    Lisinopril 20 Mg Tabs (Lisinopril) .Marland Kitchen... Take 1 tablet by mouth once a day  BP today: 191/113 Prior BP: 192/103 (10/29/2009)  Prior 10 Yr Risk Heart Disease: N/A (01/04/2007)  Labs Reviewed: K+: 4.1 (05/15/2009) Creat: : 1.25 (05/15/2009)   Chol: 214 (05/15/2009)   HDL: 35 (05/15/2009)   LDL: 135 (05/15/2009)   TG: 222 (05/15/2009)  Problem # 3:  HYPERLIPIDEMIA (ICD-272.4) Pt tolerating statin well.  Is not fasting today;will bring him back for fasting labs at next visit. His updated medication list for this  problem includes:    Lipitor 80 Mg Tabs (Atorvastatin calcium) .Marland Kitchen... Take one tablet daily for cholesterol.  Labs Reviewed: SGOT: 14 (05/15/2009)   SGPT: 31 (05/15/2009)  Lipid Goals: Chol Goal: 200 (03/06/2008)   HDL Goal: 40 (03/06/2008)   LDL Goal: 70 (03/06/2008)   TG Goal: 150 (03/06/2008)  Prior 10 Yr Risk Heart Disease: N/A (01/04/2007)   HDL:35 (05/15/2009), 36 (11/07/2008)  LDL:135 (05/15/2009), 128 (11/07/2008)  Chol:214 (  05/15/2009), 187 (11/07/2008)  Trig:222 (05/15/2009), 117 (11/07/2008)  Problem # 4:  DIABETES MELLITUS, TYPE II (ICD-250.00) Pts DM2 is not yet well controlled.  HIs CBG log reveals a need for improved meal time control.  Pt met with Barnabas Harries this am for discussion of medication options, use, etc.  Plan is to transition to sliding sclare meal time coverage in addition to Lantus.  For now, will continue Lantus at current dose; pt using 75 units at bedtime.  Will D/C glipizide and continue metformin.  Please refer to pt intruction sheet for sliding scale details.   Will bring pt back in 2 weeks to review CBGs and adjust insulin if needed.   His updated medication list for this problem includes:    Aspirin 81 Mg Tbec (Aspirin) .Marland Kitchen... Take 1 tablet by mouth once a day    Lantus Solostar 100 Unit/ml Soln (Insulin glargine) ..... Inject 75 units subcutaneously daily from solostar injector pen    Glipizide 10 Mg Tabs (Glipizide) .Marland Kitchen... Take one tablet two times a day.    Metformin Hcl 1000 Mg Tabs (Metformin hcl) .Marland Kitchen... Take one tablet two times a day.    Lisinopril 20 Mg Tabs (Lisinopril) .Marland Kitchen... Take 1 tablet by mouth once a day    Humalog Kwikpen 100 Unit/ml Soln (Insulin lispro (human)) ..... Blood sugar between 150-200 inject 2 units, 201-250 use 4 units,  251-300 use 6 units, 301-350 use 8 untits, for 351-400 use 10 units, for >400 use 12 units and call dr  Complete Medication List: 1)  Colcrys 0.6 Mg Tabs (Colchicine) .... Take 1 tablet by mouth once a day as needed  for gout 2)  Norvasc 10 Mg Tabs (Amlodipine besylate) .... Take one tablet daily for blood pressure. 3)  Catapres 0.3 Mg Tabs (Clonidine hcl) .... Take one tablet twice a day. 4)  Prilosec 20 Mg Cpdr (Omeprazole) .... Take one tablet daily. 5)  Aspirin 81 Mg Tbec (Aspirin) .... Take 1 tablet by mouth once a day 6)  Combivent 103-18 Mcg/act Aero (Ipratropium-albuterol) .... Inhale 1-2 puff every 6 hours as needed for shortness of breath. 7)  Coreg 25 Mg Tabs (Carvedilol) .... Take one tablet twice a day. 8)  Lantus Solostar 100 Unit/ml Soln (Insulin glargine) .... Inject 75 units subcutaneously daily from solostar injector pen 9)  Pen Needles 31g X 8 Mm Misc (Insulin pen needle) .... Use with lantus solostar to inject insulin twice daily 10)  Furosemide 40 Mg Tabs (Furosemide) .... Take 1 tablet by mouth two times a day 11)  Glipizide 10 Mg Tabs (Glipizide) .... Take one tablet two times a day. 12)  Metformin Hcl 1000 Mg Tabs (Metformin hcl) .... Take one tablet two times a day. 13)  Lipitor 80 Mg Tabs (Atorvastatin calcium) .... Take one tablet daily for cholesterol. 14)  Cymbalta 60 Mg Cpep (Duloxetine hcl) .... Take 1 tablet by mouth once a day 15)  Klor-con 20 Meq Pack (Potassium chloride) .... Take 1 tablet by mouth once a day 16)  Lisinopril 20 Mg Tabs (Lisinopril) .... Take 1 tablet by mouth once a day 17)  Embrace Blood Glucose Test Strp (Glucose blood) .... Check blood sugars four times daily. 18)  Bactrim Ds 800-160 Mg Tabs (Sulfamethoxazole-trimethoprim) .... Take 1 tablet by mouth two times a day for seven days 19)  Clotrimazole 1 % Crea (Clotrimazole) .... Apply to affected area three times a day 20)  Advocate Test Strp (Glucose blood) .... Use to check blood sugar before  meals and bedtime 4x eachday 21)  Humalog Kwikpen 100 Unit/ml Soln (Insulin lispro (human)) .... Blood sugar between 150-200 inject 2 units, 201-250 use 4 units,  251-300 use 6 units, 301-350 use 8 untits, for  351-400 use 10 units, for >400 use 12 units and call dr 22)  Doxycycline Hyclate 100 Mg Caps (Doxycycline hyclate) .... Take one pill two times a day for 10 days 23)  Prednisone 10 Mg Tabs (Prednisone) .... Take 3 pills by mouth once daily for 3 days, starting today, then take 2 pills once daily for 3 days, then 1 pill by mouth once daily for 3 days 24)  Pen Needles 1/2" 29g X 44mm Misc (Insulin pen needle) .... Use to inject mealtime insulin  Patient Instructions: 1)  Please schedule a follow-up appointment in 2-3 weeks to review your blood sugars. 2)  Be sure to use your spacer with your inhaler. 3)  STOP taking glipizide. 4)  Keep taking your metformin and Lantus (night-time) insulin as usual. 5)  Use your new insulin as follows:                                                   For blood sugar between 150-200 inject 2 units, for blood sugar of 201-250 use 4 units, for blood sugar of 251-300 use 6 units, for blood sugar of 301-350 use 8 untits, for blood sugar of 351-400 inject 10 units, for blood sugar greater than 400, use 12 units of insulin and call the clinic at [phone] for further directions. Prescriptions: DOXYCYCLINE HYCLATE 100 MG CAPS (DOXYCYCLINE HYCLATE) Take one pill two times a day for 10 days  #20 x 0   Entered and Authorized by:   Eduardo Osier DO   Signed by:   Eduardo Osier DO on 11/28/2009   Method used:   Print then Give to Patient   RxID:   [id number] HUMALOG KWIKPEN 100 UNIT/ML SOLN (INSULIN LISPRO (HUMAN)) Use as directed by your PCP  #1 box x 6   Entered and Authorized by:   Eduardo Osier DO   Signed by:   Eduardo Osier DO on 11/28/2009   Method used:   Print then Give to Patient   RxID:   [id number] DOXYCYCLINE HYCLATE 100 MG CAPS (DOXYCYCLINE HYCLATE) Take one pill two times a day for 10 days  #20 x 0   Entered and Authorized by:   Eduardo Osier DO   Signed by:   Eduardo Osier DO on 11/28/2009   Method used:   Print then Give to Patient   RxID:    [id number] HUMALOG KWIKPEN 100 UNIT/ML SOLN (INSULIN LISPRO (HUMAN)) Use as directed by your PCP  #1 box x 6   Entered and Authorized by:   Eduardo Osier DO   Signed by:   Eduardo Osier DO on 11/28/2009   Method used:   Print then Give to Patient   RxID:   [id number] PEN NEEDLES 1/2" 29G X 12MM MISC (INSULIN PEN NEEDLE) Use to inject mealtime insulin  #1 box x prn   Entered and Authorized by:   Eduardo Osier DO   Signed by:   Eduardo Osier DO on 11/28/2009   Method used:   Print then Give to Patient   RxID:   [id number] PREDNISONE 10 MG TABS (PREDNISONE) Take 3 pills by mouth once daily  for 3 days, starting today, then take 2 pills once daily for 3 days, then 1 pill by mouth once daily for 3 days  #18 x 0   Entered and Authorized by:   Eduardo Osier DO   Signed by:   Eduardo Osier DO on 11/28/2009   Method used:   Print then Give to Patient   RxID:   [id number] DOXYCYCLINE HYCLATE 100 MG CAPS (DOXYCYCLINE HYCLATE) Take one pill two times a day for 10 days  #20 x 0   Entered and Authorized by:   Eduardo Osier DO   Signed by:   Eduardo Osier DO on 11/28/2009   Method used:   Electronically to        [facility]. [phone]* (retail)       [address] [place]  [address]       Ph: [phone] or [phone]       Fax: ID:[phone]   RxID:   [id number] HUMALOG KWIKPEN 100 UNIT/ML SOLN (INSULIN LISPRO (HUMAN)) Use as directed by your PCP  #1 box x 6   Entered and Authorized by:   Eduardo Osier DO   Signed by:   Eduardo Osier DO on 11/28/2009   Method used:   Electronically to        [facility] [phone]* (retail)       [address]  [address]       Ph: [phone] or [phone]       Fax: [phone]   RxID:   [id number]   Prevention & Chronic Care Immunizations   Influenza vaccine: Fluvax 3+  (05/15/2009)    Tetanus booster: Not documented    Pneumococcal vaccine: Not documented    H. zoster vaccine:  Not documented  Colorectal Screening   Hemoccult: Positive  (04/20/2006)   Hemoccult action/deferral: Deferred  (11/28/2009)    Colonoscopy: Not documented   Colonoscopy action/deferral: GI referral  (05/15/2009)  Other Screening   PSA: Not documented   PSA action/deferral: Discussion deferred  (11/28/2009)   Smoking status: never  (10/29/2009)  Diabetes Mellitus   HgbA1C: 12.8  (10/29/2009)    Eye exam: Not documented    Foot exam: yes  (11/07/2008)   High risk foot: No  (01/03/2009)   Foot care education: Not documented    Urine microalbumin/creatinine ratio: 401.2  (12/16/2008)    Diabetes flowsheet reviewed?: Yes   Progress toward A1C goal: Unchanged  Lipids   Total Cholesterol: 214  (05/15/2009)   Lipid panel action/deferral: Lipid Panel ordered   LDL: 135  (05/15/2009)   LDL Direct: Not documented   HDL: 35  (05/15/2009)   Triglycerides: 222  (05/15/2009)    SGOT (AST): 14  (05/15/2009)   BMP action: Ordered   SGPT (ALT): 31  (05/15/2009)   Alkaline phosphatase: 127  (05/15/2009)   Total bilirubin: 0.5  (05/15/2009)    Lipid flowsheet reviewed?: Yes   Progress toward LDL goal: Unchanged  Hypertension   Last Blood Pressure: 191 / 113  (11/28/2009)   Serum creatinine: 1.25  (05/15/2009)   BMP action: Ordered   Serum potassium 4.1  (05/15/2009)    Hypertension flowsheet reviewed?: Yes   Progress toward BP goal: Unchanged  Self-Management Support :   Personal Goals (by the next clinic visit) :     Personal A1C goal: 7  (05/15/2009)     Personal blood  pressure goal: 130/80  (06/09/2009)     Personal LDL goal: 100  (05/15/2009)    Patient will work on the following items until the next clinic visit to reach self-care goals:     Medications and monitoring: bring all of my medications to every visit, examine my feet every day  (11/28/2009)     Eating: drink diet soda or water instead of juice or soda, eat more vegetables, eat foods that are low in salt,  eat baked foods instead of fried foods, eat fruit for snacks and desserts, limit or avoid alcohol  (11/28/2009)     Activity: take a 30 minute walk every day  (11/28/2009)    Diabetes self-management support: Resources for patients handout  (10/29/2009)   Last diabetes self-management training by diabetes educator: 11/28/2009    Hypertension self-management support: Resources for patients handout  (10/29/2009)    Lipid self-management support: Resources for patients handout  (10/29/2009)

## 2010-09-17 NOTE — Progress Notes (Signed)
Summary: Refill/gh  Phone Note Refill Request Message from:  Patient on March 23, 2010 3:32 PM  Refills Requested: Medication #1:  COMBIVENT 103-18 MCG/ACT AERO inhale 1-2 puff every 6 hours as needed for shortness of breath.  Medication #2:  COREG 25 MG  TABS take one tablet twice a day.  Medication #3:  LANTUS SOLOSTAR 100 UNIT/ML  SOLN inject 75 units subcutaneously daily from solostar injector pen  Medication #4:  FUROSEMIDE 40 MG TABS Take 1 tablet by mouth two times a day Wants 6 month refills for mail order.   Method Requested: Fax to [place] Initial call taken by: Sander Nephew RN,  March 23, 2010 3:32 PM  Follow-up for Phone Call        completed refill, thank you [name]  Follow-up by: [name] MD,  March 24, 2010 1:49 PM    Prescriptions: ASPIRIN 81 MG TBEC (ASPIRIN) Take 1 tablet by mouth once a day  #96 x 4   Entered and Authorized by:   [name] MD   Signed by:   [name] MD on 03/24/2010   Method used:   Faxed to ...       [facility] (mail-order)             , [place]         Ph: [phone]       Fax: [phone]   RxID:   [id number] COMBIVENT 103-18 MCG/ACT AERO (IPRATROPIUM-ALBUTEROL) inhale 1-2 puff every 6 hours as needed for shortness of breath.  #1 x 11   Entered and Authorized by:   [name] MD   Signed by:   [name] MD on 03/24/2010   Method used:   Faxed to ...       [facility] (mail-order)             , [place]         Ph: [phone]       Fax: [phone]   RxID:   [id number] COREG 25 MG  TABS (CARVEDILOL) take one tablet twice a day.  #190 x 4   Entered and Authorized by:   [name] MD   Signed by:   [name] MD on 03/24/2010   Method used:   Faxed to ...       [facility] (mail-order)             , [place]         Ph: [phone]       Fax: [phone]   RxID:   [id number] LANTUS SOLOSTAR 100 UNIT/ML  SOLN (INSULIN GLARGINE) inject 75 units subcutaneously daily from  solostar injector pen  #1 x 11   Entered and Authorized by:   [name] MD   Signed by:   [name] MD on 03/24/2010   Method used:   Faxed to ...       [facility] (mail-order)             , [place]         Ph: [phone]       Fax: [phone]   RxID:   [id number] PEN NEEDLES 31G X 8 MM  MISC (INSULIN PEN NEEDLE) use with lantus and Humalog to inject insulin 5x a day ( 250.00)  #500 x 3   Entered and Authorized by:   [name] MD   Signed by:   [name] MD on 03/24/2010   Method used:  Faxed to ...       [facility] (mail-order)             , [place]         Ph: [phone]       Fax: [phone]   RxID:   [id number] FUROSEMIDE 40 MG TABS (FUROSEMIDE) Take 1 tablet by mouth two times a day  #190 x 4   Entered and Authorized by:   [name] MD   Signed by:   [name] MD on 03/24/2010   Method used:   Faxed to ...       [facility] (mail-order)             , [place]         Ph: [phone]       Fax: [phone]   RxID:   [id number] GLIPIZIDE 10 MG TABS (GLIPIZIDE) Take one tablet two times a day.  #190 x 4   Entered and Authorized by:   [name] MD   Signed by:   [name] MD on 03/24/2010   Method used:   Faxed to ...       [facility] (mail-order)             , [place]         Ph: [phone]       Fax: [phone]   RxID:   [id number] METFORMIN HCL 1000 MG TABS (METFORMIN HCL) Take one tablet two times a day.  #190 x 4   Entered and Authorized by:   [name] MD   Signed by:   [name] MD on 03/24/2010   Method used:   Faxed to ...       [facility] (mail-order)             , [place]         Ph: [phone]       Fax: [phone]   RxID:   [id number] LIPITOR 80 MG TABS (ATORVASTATIN CALCIUM) Take one tablet daily for cholesterol.  #96 x 6   Entered and Authorized by:   [name] MD   Signed by:   [name] MD on 03/24/2010   Method used:   Faxed to ...       [facility] (mail-order)             , [place]         Ph: [phone]       Fax: [phone]   RxID:   [id number] CYMBALTA 60 MG CPEP (DULOXETINE HCL) Take 1 tablet by mouth once a day  #90 x 6   Entered and Authorized by:   [name] MD   Signed by:   [name] MD on 03/24/2010   Method used:   Faxed to ...       [facility] (mail-order)             , [place]         Ph: [phone]       Fax: [phone]   RxID:   [id number] KLOR-CON 20 MEQ PACK (POTASSIUM CHLORIDE) Take 1 tablet by mouth once a day  #90 x 4   Entered and Authorized by:   [name] MD   Signed by:   [name] MD on 03/24/2010   Method used:   Faxed to ...       [facility] officer)             ,  [place]         Ph: [phone]       Fax: [phone]   [id number] LISINOPRIL 20 MG TABS (LISINOPRIL) Take 1 tablet by mouth once a day  #30 x 6   Entered and Authorized by:   [name] MD   Signed by:   [name] MD on 03/24/2010   Method used:   Faxed to ...       [facility] (mail-order)             , [place]         Ph: [phone]       Fax: [phone]   RxID:   [id number] EMBRACE BLOOD GLUCOSE TEST  STRP (GLUCOSE BLOOD) Check blood sugars four times daily.  #120 x 6   Entered and Authorized by:   [name] MD   Signed by:   [name] MD on 03/24/2010   Method used:   Faxed to ...       [facility] (mail-order)             , [place]         Ph: [phone]       Fax: [phone]   RxID:   [id number] ADVOCATE TEST  STRP (GLUCOSE BLOOD) use to check blood sugar before meals and bedtime 4x eachday  #1 box x 11   Entered and Authorized by:   [name] MD   Signed by:   [name] MD on 03/24/2010   Method used:   Faxed to ...       [facility] (mail-order)             , [place]         Ph: [phone]       Fax: [phone]   RxID:   [id number] HUMALOG KWIKPEN 100 UNIT/ML SOLN (INSULIN LISPRO (HUMAN)) blood sugar  between 150-200 use 2 units, 201-250 = 4 units,  251-300 = 6 units, 301-350 = 8 units, 351-400 = 10 units, for >400 use 12 units & call Dr.Up to 40 units/d  #3 boxes x 0   Entered and Authorized by:   [name] MD   Signed by:   [name] MD on 03/24/2010   Method used:   Faxed to ...       [facility] (mail-order)             , [place]         Ph: [phone]       Fax: [phone]   RxID:   [id number] ONETOUCH ULTRA TEST  STRP (GLUCOSE BLOOD) use to check blood sugar 2-3x a day: at least before breakfast and  before dinner ( 250.02) Brand medically necessary #300 x 3   Entered and Authorized by:   [name] MD   Signed by:   [name] MD on 03/24/2010   Method used:   Faxed to ...       [facility] (mail-order)             , [place]         Ph: [phone]       Fax: [phone]   RxID:   [id number] Jonetta Speak LANCETS  MISC (LANCETS) use to check blood sugar 2-3x daily( 250.00)  #300 x 3   Entered and Authorized by:   [name] MD   Signed by:   [name] MD on 03/24/2010   Method  used:   Faxed to ...       [facility] (mail-order)             , [place]         Ph: [phone]       Fax: [phone]   RxID:   [id number] PEN NEEDLES 1/2" 29G X 12MM MISC (INSULIN PEN NEEDLE) Use to inject mealtime insulin  #1 box x prn   Entered and Authorized by:   [name] MD   Signed by:   [name] MD on 03/24/2010   Method used:   Faxed to ...       [facility] (mail-order)             , [place]         Ph: [phone]       Fax: [phone]   RxID:   [id number]

## 2010-09-17 NOTE — Assessment & Plan Note (Signed)
Summary: DIABETES TEACHING/CFB   Vital Signs:  Patient profile:   65 year old male Weight:      319.8 pounds BMI:     43.53 Is Patient Diabetic? Yes Did you bring your meter with you today? Yes   Allergies: No Known Drug Allergies   Complete Medication List: 1)  Colcrys 0.6 Mg Tabs (Colchicine) .... Take 1 tablet by mouth once a day as needed for gout 2)  Norvasc 10 Mg Tabs (Amlodipine besylate) .... Take one tablet daily for blood pressure. 3)  Catapres 0.3 Mg Tabs (Clonidine hcl) .... Take one tablet twice a day. 4)  Prilosec 20 Mg Cpdr (Omeprazole) .... Take one tablet daily. 5)  Aspirin 81 Mg Tbec (Aspirin) .... Take 1 tablet by mouth once a day 6)  Combivent 103-18 Mcg/act Aero (Ipratropium-albuterol) .... Inhale 1-2 puff every 6 hours as needed for shortness of breath. 7)  Coreg 25 Mg Tabs (Carvedilol) .... Take one tablet twice a day. 8)  Lantus Solostar 100 Unit/ml Soln (Insulin glargine) .... Inject 75 units subcutaneously daily from solostar injector pen 9)  Pen Needles 31g X 8 Mm Misc (Insulin pen needle) .... Use with lantus solostar to inject insulin twice daily 10)  Furosemide 40 Mg Tabs (Furosemide) .... Take 1 tablet by mouth two times a day 11)  Glipizide 10 Mg Tabs (Glipizide) .... Take one tablet two times a day. 12)  Metformin Hcl 1000 Mg Tabs (Metformin hcl) .... Take one tablet two times a day. 13)  Lipitor 80 Mg Tabs (Atorvastatin calcium) .... Take one tablet daily for cholesterol. 14)  Cymbalta 60 Mg Cpep (Duloxetine hcl) .... Take 1 tablet by mouth once a day 15)  Klor-con 20 Meq Pack (Potassium chloride) .... Take 1 tablet by mouth once a day 16)  Lisinopril 20 Mg Tabs (Lisinopril) .... Take 1 tablet by mouth once a day 17)  Embrace Blood Glucose Test Strp (Glucose blood) .... Check blood sugars four times daily. 18)  Bactrim Ds 800-160 Mg Tabs (Sulfamethoxazole-trimethoprim) .... Take 1 tablet by mouth two times a day for seven days 19)  Clotrimazole 1 %  Crea (Clotrimazole) .... Apply to affected area three times a day 20)  Advocate Test Strp (Glucose blood) .... Use to check blood sugar before meals and bedtime 4x eachday 21)  Humalog Kwikpen 100 Unit/ml Soln (Insulin lispro (human)) .... Blood sugar between 150-200 inject 2 units, 201-250 use 4 units,  251-300 use 6 units, 301-350 use 8 untits, for 351-400 use 10 units, for >400 use 12 units and call dr 22)  Doxycycline Hyclate 100 Mg Caps (Doxycycline hyclate) .... Take one pill two times a day for 10 days 23)  Prednisone 10 Mg Tabs (Prednisone) .... Take 3 pills by mouth once daily for 3 days, starting today, then take 2 pills once daily for 3 days, then 1 pill by mouth once daily for 3 days 24)  Pen Needles 1/2" 29g X 35mm Misc (Insulin pen needle) .... Use to inject mealtime insulin  Patient Instructions: 1)  Make an appointment to see doctor today on your way out. (A1C due)   Diabetes Self Management Training  PCP: Myrtis Ser MD Date diagnosed with diabetes: 08/16/2004 Diabetes Type: Type 2 insulin treated Other persons present: no Current smoking Status: never  Vital Signs Todays Weight: 319.8lb  in BMI 43.53in-lbs   Diabetes Medications:  Comments: Using 8-24 units Humalog/day. Brought meter- cannot download. patient did excellent job recording blood sugars nad humalog doses for  past month and a half. CBgs lower in am, higher in PM, He is tkaing blood sugars nad insulin after eating in PMs. Had some unusual meter readings- he says that he has difficutly using current meter- getting blood into strip and requsted differnt emter today. We discussed that he would probalby have to pay 6$ per month for strip s for other meters. He was fine wiht this. One Touch Ultramimi given to patient after he showed that he was abel to use it and agreed that this meter might be better for him. His goal is to tak einsulin before meals. Patient reports glipiizde was stopped an dis almost out of  Humalog. See scanned logs for CBGs 100-200 in ams, 200-300 in PM- await A1C in next month to fully evaluate since he has been testing in PMs after meals    Monitoring Self monitoring blood glucose 2x a week Name of Meter  advocate  Time of Testing  Before Breakfast  Recent Episodes of: Requiring Help from another person  Hyperglycemia : Yes Hypoglycemia: No Severe Hypoglycemia : No     Midmorning # of Carbs/Grams diet soda ro water Other # of Carbs/Grams regular soda mixed with water, chips, peanutbutter saltines Would you  say you are physically active: Yes Diabetes Disease Process  Discussed today Define diabetes in simple terms: Needs review/assistance Medications State insulin adjustment guidelines: Needs review/assistance    Nutritional Management Identify what foods most often affect blood glucose: Needs review/assistance    Verbalize importance of controlling food portions: Needs review/assistance   State changes planned for home meals/snacks: Needs review/assistance    Monitoring  Complications State the causes-signs and symptoms and prevention of Hyperglycemia: Needs review/assistance   Explain proper treatment of hyperglycemia: Needs review/assistance   Glucose control and the development/prevention of long-term complications: Needs review/assistance   State benefits-risks-and options for improving blood sugar control: Needs review/assistance    Exercise Diabetes Management Education Done: 01/26/2010    BEHAVIORAL GOAL FOLLOW UP Utilizing medications if for therapeutic effectiveness: Most of the time Monitoring blood glucose levels daily: Most of the time Specific goal set today: Is goi to inject insulin before meals and try to drink more diet & less regular soda      May get better control of blood sugars with safe set doses of Humalog, but will need to reassess after he has started taking it before meals consistently.  Diabetes Self Management  Support: has friend and home health nurse who provide support-  Follow-up:6-8weeks     Appended Document: DIABETES TEACHING/CFB

## 2010-09-17 NOTE — Assessment & Plan Note (Signed)
Summary: DM TRAINING/VS   Vital Signs:  Patient profile:   65 year old male Weight:      317.1 pounds Is Patient Diabetic? Yes Did you bring your meter with you today? Yes CBG Device ID advocate   Allergies: No Known Drug Allergies   Complete Medication List: 1)  Colcrys 0.6 Mg Tabs (Colchicine) .... Take 1 tablet by mouth once a day as needed for gout 2)  Norvasc 10 Mg Tabs (Amlodipine besylate) .... Take one tablet daily for blood pressure. 3)  Catapres 0.3 Mg Tabs (Clonidine hcl) .... Take one tablet twice a day. 4)  Prilosec 20 Mg Cpdr (Omeprazole) .... Take one tablet daily. 5)  Aspirin 81 Mg Tbec (Aspirin) .... Take 1 tablet by mouth once a day 6)  Combivent 103-18 Mcg/act Aero (Ipratropium-albuterol) .... Inhale 1-2 puff every 6 hours as needed for shortness of breath. 7)  Coreg 25 Mg Tabs (Carvedilol) .... Take one tablet twice a day. 8)  Lantus Solostar 100 Unit/ml Soln (Insulin glargine) .... Inject 80 units subcutaneously daily from solostar injector pen 9)  Pen Needles 31g X 8 Mm Misc (Insulin pen needle) .... Use with lantus solostar to inject insulin twice daily 10)  Furosemide 40 Mg Tabs (Furosemide) .... Take 1 tablet by mouth two times a day 11)  Glipizide 10 Mg Tabs (Glipizide) .... Take one tablet two times a day. 12)  Metformin Hcl 1000 Mg Tabs (Metformin hcl) .... Take one tablet two times a day. 13)  Lipitor 80 Mg Tabs (Atorvastatin calcium) .... Take one tablet daily for cholesterol. 14)  Cymbalta 60 Mg Cpep (Duloxetine hcl) .... Take 1 tablet by mouth once a day 15)  Klor-con 20 Meq Pack (Potassium chloride) .... Take 1 tablet by mouth once a day 16)  Lisinopril 20 Mg Tabs (Lisinopril) .... Take 1 tablet by mouth once a day 17)  Embrace Blood Glucose Test Strp (Glucose blood) .... Check blood sugars four times daily. 18)  Bactrim Ds 800-160 Mg Tabs (Sulfamethoxazole-trimethoprim) .... Take 1 tablet by mouth two times a day for seven days 19)  Clotrimazole 1  % Crea (Clotrimazole) .... Apply to affected area three times a day 20)  Advocate Test Strp (Glucose blood) .... Use to check blood sugar before meals and bedtime 4x eachday  Other Orders: DSMT(Medicare) Individual, 30 Minutes [id number])  Diabetes Self Management Training  PCP: Myrtis Ser MD Date diagnosed with diabetes: 08/16/2004 Diabetes Type: Type 2 insulin treated Other persons present: no Current smoking Status: never  Vital Signs Todays Weight: 317.1lb  in in-lbs   Assessment Daily activities: goes to [employer] 4 x /week when feeling better Sources of Support: has friend he goes to gym with- has been goig more often lately until current illness Knowledge Deficits: did not realize juice had same calories as regular soda  Diabetes Medications:  Comments: has been tkaing all his medicine with out fail, including lantus nad glipizide twice daily, he brought meter- date was incorrect, but he has been chekcing regularly for past 2 weeks twice a day in anticipation of today's visit. 7 week average is 300, 14 day average is 273, 21-90 day average 269. log written form his calendar wil be scanned-shows am CBGs 148-276, pm readings 280-449. he reports testing before meals most of the time, but occassionally after. he is willing to add injections before meals and increase self mnonitoirng to 3-4 times a day. he verbalized understaidng to needing a second type of insulin to cover food  and timing. He requsts and insulin pen as ths is easier with his vision and that [facility]. only eats two meals a day so would suggest rapid acting insulin 8 units before noon meal and 5 units before evening meal and decrease lantus dose to 60 units at night.    Long Acting  Insulin Type:Lantus  Bedtime Dose: 75    Monitoring Self monitoring blood glucose 2x a week Name of Meter  advocate  Time of Testing  Before meals  Before Breakfast  Before Lunch  Before  Dinner  Recent Episodes of: Requiring Help from another person  Hyperglycemia : Yes Hypoglycemia: No Severe Hypoglycemia : No     Estimated /Usual Carb Intake Breakfast # of Carbs/Grams apple or banana Midmorning # of Carbs/Grams has been eating mostly chicken noodle soup and juice for past 2 days" but had some spaghetti this am Lunch # of Carbs/Grams largest meal per patient- example si big bowl of spaghetti and 2 slices ofgarlic bread Dinner # of Carbs/Grams leftovers form noon meal Bedtime # of Carbs/Grams snack of fruit or cookies Other # of Carbs/Grams 1=15gm- eats some when awakin in middle of night  Nutrition assessment Weight change: decreased  ~ 15 -18 pounds in past year, but stable lately What beverages do you drink?  water- goes through 10-20 gallon container every 8-9 days, also fills large container with regular soda and water  Activity Limitations  Appropriate physical activity Would you  say you are physically active: Yes Exercise alone:  No  Type of physical activity  Other  goes to gym almost every weekday when feeling well with friend Diabetes Disease Process  Discussed today State diabetes is treated by meal plan-exercise-medication-monitoring-education: Demonstrates competency Medications State name-action-dose-duration-side effects-and time to take medication: Demonstrates competency   State appropriate timing of food related to medication: Demonstrates competency   Demonstrates/verbalizes site selection and rotation for injections Demonstrates competency   Correctly draw up and administer insulin-Byetta-Symlin-glucagon: Demonstrates competency   State insulin adjustment guidelines: Not applicableDescribe safe needle/lancet disposal: Demonstrates competency Nutritional Management Identify what foods most often affect blood glucose: Needs review/assistance    Verbalize importance of controlling food portions: Needs review/assistance   State  importance of spacing and not omitting meals and snacks: Needs review/assistanceState changes planned for home meals/snacks: Needs review/assistance Monitoring State purpose and frequency of monitoring BG-ketones-HgbA1C  : Needs review/assistance   Perform glucose monitoring/ketone testing and record results correctly: Demonstrates competencyState target blood glucose and HgbA1C goals: Needs review/assistance    Complications State the causes- signs and symptoms and prevention of hypoglycemia: Needs review/assistance   Explain proper treatment of hypoglycemia: Needs review/assistance    Exercise States importance of exercise: Demonstrates competencyStates effect of exercise on blood glucose: Demonstrates competencyVerbalizes safety measures for exercise related to diabetes: Demonstrates competency Lifestyle changes:Goal setting and Problem solving State benefits of making appropriate lifestyle changes: Needs review/assistanceIdentify lifestyle behaviors that need to change: Needs review/assistanceIdentify risk factors that interfere with health: Needs review/assistanceDevelop strategies to reduce risk factors: Needs review/assistanceVerbalize need for and frequency of health care follow-up: Needs review/assistanceIdentify Family/SO role in managing diabetes: Demonstrates competencyDiabetes Management Education Done: 11/28/2009    BEHAVIORAL GOALS INITIAL Monitoring blood glucose levels daily: check blood sugar before each meal and before injecting new insulin        suggest beginning prandial Novolog insulin via flexpen 8 units before noon meal and 6 units before evening meal and stoppping glipizide and decreasing lantus to 60  units at night.  patient instructed if he begins mealtimes insulin to decreas the lantus the night before.   Diabetes Self Management Support: has friend and home health nurse who provide support-  Follow-up:2-4 weeks

## 2010-09-17 NOTE — Letter (Signed)
Summary: DIABETIC SUPPLIES  DIABETIC SUPPLIES   Imported By: Enedina Finner 08/28/2010 12:11:50  _____________________________________________________________________  External Attachment:    Type:   Image     Comment:   External Document

## 2010-09-17 NOTE — Miscellaneous (Signed)
  Clinical Lists Changes  Problems: Removed problem of UTI (ICD-599.0) Removed problem of BALANITIS (ICD-607.1) Observations: Added new observation of HGBA1C: 14.6 % (06/29/2010 13:51) Added new observation of LDL: 118 mg/dL (06/29/2010 13:51) Added new observation of HDL: 32 mg/dL (06/29/2010 13:51) Added new observation of TRIGLYC TOT: 107 mg/dL (06/29/2010 13:51) Added new observation of CHOLESTEROL: 171 mg/dL (06/29/2010 13:51) Added new observation of PLATELETK/UL: 168 K/uL (06/29/2010 13:51) Added new observation of HGB: 15.8 g/dL (06/29/2010 13:51) Added new observation of WBC COUNT: 8.2 10*3/microliter (06/29/2010 13:51) Added new observation of CALCIUM: 9.8 mg/dL (06/29/2010 13:51) Added new observation of ALBUMIN: 4.1 g/dL (06/29/2010 13:51) Added new observation of PROTEIN, TOT: 7.6 g/dL (06/29/2010 13:51) Added new observation of SGPT (ALT): 26 units/L (06/29/2010 13:51) Added new observation of SGOT (AST): 18 units/L (06/29/2010 13:51) Added new observation of ALK PHOS: 117 units/L (06/29/2010 13:51) Added new observation of BILI TOTAL: 0.8 mg/dL (06/29/2010 13:51) Added new observation of CREATININE: 1.12 mg/dL (06/29/2010 13:51) Added new observation of BUN: 17 mg/dL (06/29/2010 13:51) Added new observation of BG RANDOM: 343 mg/dL (06/29/2010 13:51) Added new observation of CO2 PLSM/SER: 27 meq/L (06/29/2010 13:51) Added new observation of CL SERUM: 97 meq/L (06/29/2010 13:51) Added new observation of K SERUM: 4.1 meq/L (06/29/2010 13:51) Added new observation of NA: 135 meq/L (06/29/2010 13:51)

## 2010-09-17 NOTE — Progress Notes (Signed)
Summary: med refill/gp  Phone Note Refill Request Message from:  Patient on January 21, 2010 2:05 PM  Refills Requested: Medication #1:  HUMALOG KWIKPEN 100 UNIT/ML SOLN blood sugar between 150-200 inject 2 units  Medication #2:  CYMBALTA 60 MG CPEP Take 1 tablet by mouth once a day Pt. will save cost for 90 days supplies. He has 2 refills left on Cymbalta but it   will  be cheaper for the pt. if new Rx written for 90 day instead of 60 days(2 refills left)  per the pharmacist.   Method Requested: Electronic Initial call taken by: Morrison Old RN,  January 21, 2010 2:05 PM  Follow-up for Phone Call        Please call in one month supply of the quick pens.  I am not sure why it is not letting me sign the prescription.  He will need to come in soon for a DM appointment. I will flag [place].    Prescriptions: CYMBALTA 60 MG CPEP (DULOXETINE HCL) Take 1 tablet by mouth once a day  #90 x 2   Entered and Authorized by:   Myrtis Ser MD   Signed by:   Myrtis Ser MD on 01/22/2010   Method used:   Electronically to        [facility]* (retail)       [place]  [address]       Ph: [phone]       Fax: [phone]   RxID:   [id number]   Appended Document: med refill/gp Humalog  kwikpen  Rx called to [facility] per Dr. Volanda Napoleon.

## 2010-10-22 ENCOUNTER — Encounter: Payer: Self-pay | Admitting: Internal Medicine

## 2010-10-22 ENCOUNTER — Ambulatory Visit (INDEPENDENT_AMBULATORY_CARE_PROVIDER_SITE_OTHER): Payer: Medicare HMO | Admitting: Internal Medicine

## 2010-10-22 DIAGNOSIS — E785 Hyperlipidemia, unspecified: Secondary | ICD-10-CM

## 2010-10-22 DIAGNOSIS — E119 Type 2 diabetes mellitus without complications: Secondary | ICD-10-CM

## 2010-10-22 DIAGNOSIS — I10 Essential (primary) hypertension: Secondary | ICD-10-CM

## 2010-10-22 DIAGNOSIS — F32A Depression, unspecified: Secondary | ICD-10-CM

## 2010-10-22 DIAGNOSIS — I2510 Atherosclerotic heart disease of native coronary artery without angina pectoris: Secondary | ICD-10-CM

## 2010-10-22 DIAGNOSIS — Z79899 Other long term (current) drug therapy: Secondary | ICD-10-CM

## 2010-10-22 DIAGNOSIS — F329 Major depressive disorder, single episode, unspecified: Secondary | ICD-10-CM

## 2010-10-22 LAB — POCT GLYCOSYLATED HEMOGLOBIN (HGB A1C): Hemoglobin A1C: 11.3

## 2010-10-22 LAB — COMPREHENSIVE METABOLIC PANEL
AST: 11 U/L (ref 0–37)
Albumin: 4 g/dL (ref 3.5–5.2)
BUN: 13 mg/dL (ref 6–23)
Calcium: 9.2 mg/dL (ref 8.4–10.5)
Chloride: 96 mEq/L (ref 96–112)
Creat: 0.97 mg/dL (ref 0.40–1.50)
Glucose, Bld: 309 mg/dL — ABNORMAL HIGH (ref 70–99)

## 2010-10-22 LAB — GLUCOSE, CAPILLARY: Glucose-Capillary: 350 mg/dL — ABNORMAL HIGH (ref 70–99)

## 2010-10-22 MED ORDER — LISINOPRIL 40 MG PO TABS: 40.0000 mg | ORAL_TABLET | Freq: Every day | ORAL | Status: DC

## 2010-10-22 MED ORDER — GLUCOSE BLOOD VI STRP: ORAL_STRIP | Status: AC

## 2010-10-22 MED ORDER — ACCU-CHEK AVIVA KIT: PACK | Status: DC

## 2010-10-22 MED ORDER — ROSUVASTATIN CALCIUM 20 MG PO TABS: 20.0000 mg | ORAL_TABLET | ORAL | Status: DC

## 2010-10-22 NOTE — Patient Instructions (Addendum)
1) Please follow-up at the clinic in 1 month, at which time we will reevaluate your blood pressure, diabetes, cholesterol. 2) STOP taking Lipitor 3) You have been started on Crestor - take it once weekly, if you develop throat closing, tongue swelling, rash, please stop the medication and call the clinic at [phone] and go to the ER. 4) You have been referred to the cardiologist, please follow-up with them at their next available visit. 5) Please bring all of your medications in a bag to your next visit. 6) Please restart your Humalog and Lisinopril.

## 2010-10-22 NOTE — Progress Notes (Signed)
Subjective:    Patient ID: [name], male    DOB: [birth year], 65 y.o.   MRN: [id number]  HPI  Patient is a 65 yo male with PMHX of uncontrolled DMII, HLD, OSA, HTN who presents to clinic today for the following:  1) DMII - Patient not checking blood sugars regularly x 1 month because current meter is broken. Currently taking Lantus 80 units qHS, as well as each bedtime Humalog , which he is taking between 5-10 units daily. Patient is supposed to be on a sliding scale Humalog regimen with meals , however since his meter is broken he is only using the Humalog once daily.  Patient denies nausea, vomiting, abdominal pain, polyuria, polydipsia, vision changes.  States that his last eye exam was in December 2011. However he does not have the report with him today.   2) HTN - does not check blood pressure regularly at home. Blood pressures remain high from past records. Supposed to be taking Amlodipine 10 mg, Carvedilol 25 mg BID, Furosemide 40mg  BID, Lisinopril 40mg  daily - but out of Lisinopril x 1 month . Denies headaches, dizziness, lightheadedness, chest pain, shortness of breath.  3) HLD - out of Lipitor x 1 month secondary to financial constraints. Requests to be changed to different medication. Denies chest pain, difficulty breathing, palpitations, tachycardia, and muscle pains.   Last FLP LDL suboptimal (LDL 118).  4) Depression  - Was previously on Cymbalta, which he stopped > 1 month ago 2/2 to cost.  At this time the patient does not feel he needs the Cymbalta, denies any depressive symptoms including feelings of isolation, no feeling down, no anhedonia, no sleep disturbances,  No suicidal ideation, no homicidal ideation.    Review of Systems Per HPI.  Meds: Medication Sig  . albuterol-ipratropium (COMBIVENT) 18-103 MCG/ACT inhaler Inhale 2 puffs into the lungs every 6 (six) hours as needed. Once to two puffs every 6 hours as needed for shortness of breath.   Marland Kitchen amLODipine  (NORVASC) 10 MG tablet Take 10 mg by mouth daily.    Marland Kitchen aspirin 81 MG tablet Take 81 mg by mouth daily.    . carvedilol (COREG) 25 MG tablet Take 25 mg by mouth 2 (two) times daily with a meal.    . cloNIDine (CATAPRES) 0.3 MG tablet Take 0.3 mg by mouth 2 (two) times daily.    . colchicine 0.6 MG tablet Take 0.6 mg by mouth daily. 1 tablets once daily as needed for gout.   . furosemide (LASIX) 40 MG tablet Take 40 mg by mouth 2 (two) times daily.    . insulin glargine (LANTUS SOLOSTAR) 100 UNIT/ML injection Inject 80 Units into the skin at bedtime.    . Insulin Pen Needle 31G X 8 MM MISC by Does not apply route. Use with Lantus and Humalog to inject insulin 5x daily.   . metFORMIN (GLUCOPHAGE) 1000 MG tablet Take 1,000 mg by mouth 2 (two) times daily with a meal.    . omeprazole (PRILOSEC OTC) 20 MG tablet Take 20 mg by mouth daily.    . potassium chloride (KLOR-CON) 20 MEQ packet Take 20 mEq by mouth daily.    . Blood Glucose Monitoring Suppl (ACCU-CHEK AVIVA) kit Check blood sugar 3-4 x daily as directed.  Marland Kitchen glucose blood (ACCU-CHEK AVIVA) test strip Use as instructed  . insulin lispro (HUMALOG KWIKPEN) 100 UNIT/ML injection Inject into the skin 3 (three) times daily before meals. Blood sugars between 15-200 use 2 units, 201-250 =  4 units, 251-300 = 6 units, 301-350 = 8 units, 351-400 = 10 units, for > 400 use 12 units and call doctor. Max 40 units/ day.   . lisinopril (PRINIVIL,ZESTRIL) 40 MG tablet Take 1 tablet (40 mg total) by mouth daily.  . rosuvastatin (CRESTOR) 20 MG tablet Take 1 tablet (20 mg total) by mouth once a week.  . Tamsulosin HCl (FLOMAX) 0.4 MG CAPS Take 0.4 mg by mouth.      Allergies: Review of patient's allergies indicates no known allergies.  Past Medical History: Diagnosis Date  . Diabetes type 2, uncontrolled   . Gout   . Hyperlipidemia   . Hypertension   . Obstructive sleep apnea     On CPAP. Last sleep study (06/2009) - Severe obstructive sleep  apnea/hypopnea syndrome, apnea-hypopnea index 70.5 per hour with non positional events moderately loud snoring and oxygen desaturation to a nadir of 85% on room air.  . Benign prostatic hypertrophy   . Morbid obesity   . Cardiomyopathy, hypertensive      diastolic dysfunction by [id number] echo  . Coronary artery disease      nonobstructive. 2 vessel. cardiac catheter in March 2005 showing 60% LAD occlusion, 30% circumflex occlusion.  . 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 Legally blind   . Herpes   . Occult blood positive stool      History of in August 2007.  colonoscopy in January 2008 showing normal colon with fair inadequate prep. By Dr. Silvano Rusk.  . Degenerative joint disease      Right knee.  . Incarcerated ventral hernia     H/O. S/P repair ventral hernia repair 07/2005.  . Diabetic ulcer of thigh     H/O Rt thigh ulcer.    Past Surgical History: Procedure Date  . Pacemaker insertion 10/2003    Secondary to symptomatic bradycardia. DDDR pacemaker. By Dr. Rollene Fare.  . Pacemaker lead revision 12/2003     likely microdislodgment  of right ventricular lead  . Ventral hernia repair 07/2005     S/P laparotomy, lysis of adhesions, repair of incarcerated ventral hernia with  partial omentectomy.. Performed by Dr. Marlou Starks.          Objective:   Physical Exam General: Vital signs reviewed and noted. Well-developed,well-nourished,in no acute distress; alert,appropriate and cooperative throughout examination. Head: normocephalic, atraumatic. Neck: No deformities, masses, or tenderness noted. Lungs: Normal respiratory effort. Clear to auscultation BL without crackles or wheezes.  Heart: RRR. S1 and S2 normal without gallop, or rubs. Grade  3/6 systolic murmur Abdomen: Obese.BS normoactive. Soft, Nondistended, non-tender.  No masses or organomegaly. Extremities: trace pretibial edema.         Assessment & Plan:

## 2010-10-23 ENCOUNTER — Encounter: Payer: Self-pay | Admitting: Internal Medicine

## 2010-10-23 DIAGNOSIS — K436 Other and unspecified ventral hernia with obstruction, without gangrene: Secondary | ICD-10-CM | POA: Insufficient documentation

## 2010-10-23 DIAGNOSIS — I2510 Atherosclerotic heart disease of native coronary artery without angina pectoris: Secondary | ICD-10-CM | POA: Insufficient documentation

## 2010-10-23 DIAGNOSIS — F32A Depression, unspecified: Secondary | ICD-10-CM | POA: Insufficient documentation

## 2010-10-23 DIAGNOSIS — I119 Hypertensive heart disease without heart failure: Secondary | ICD-10-CM | POA: Insufficient documentation

## 2010-10-23 DIAGNOSIS — F329 Major depressive disorder, single episode, unspecified: Secondary | ICD-10-CM | POA: Insufficient documentation

## 2010-10-23 DIAGNOSIS — M1990 Unspecified osteoarthritis, unspecified site: Secondary | ICD-10-CM | POA: Insufficient documentation

## 2010-10-23 DIAGNOSIS — Z950 Presence of cardiac pacemaker: Secondary | ICD-10-CM | POA: Insufficient documentation

## 2010-10-23 NOTE — Assessment & Plan Note (Signed)
Lab Results  Component Value Date   HGBA1C 11.3 10/22/2010   HGBA1C 14.6 06/29/2010   CREATININE 0.97 10/22/2010   CREATININE 1.12 06/29/2010   MICROALBUR 22.02* 03/25/2010   MICRALBCREAT 237.5* 03/25/2010   CHOL 171 06/29/2010   LDL 118 06/29/2010   HDL 32 06/29/2010   TRIG 107 06/29/2010    Last eye exam and foot exam: Requesting records from ophthalmologist regarding last eye exam. Last foot exam 06/2010 - high risk foot. Plan to repeat 12/2010.   Assessment: Diabetes control: not controlled Progress toward goals: improved Barriers to meeting goals: lack of understanding of disease management and glucometer broken for close to 1 month. Patient not checking blood sugars. Not taking prescribed insulin regimen of Lantus + sliding scale Humalog, and is only taking Humalog 5-10 units once daily.  Plan: Diabetes treatment: Have written a prescription for new meter that has bigger screen. Recommended to check BG 3-4 times daily, and take Humalog sliding scale as prescribed (SS with meals). Will follow-up with Barnabas Harries regarding further dietary education, and assrance that pt was able to get his meter and is following prescribed regimen. Refer to: diabetes educator for self-management training and diabetes educator for medical nutrition therapy Instruction/counseling given: reminded to bring blood glucose meter & log to each visit, discussed the need for weight loss and other instruction/counseling: call clinic if unable to get meter 2/2 to insurance. Will then have him f/u with Barnabas Harries and arrange for a meter.

## 2010-10-23 NOTE — Assessment & Plan Note (Signed)
Patient without signs or symptoms of depression at this time. No suicidal or homicidal ideations. Has been off of his Cymbalta for the past month. Prefers to discontinue this medication. -  Discontinue Cymbalta.

## 2010-10-23 NOTE — Assessment & Plan Note (Addendum)
Lab Results  Component Value Date   NA 135 10/22/2010   K 4.3 10/22/2010   CL 96 10/22/2010   CO2 24 10/22/2010   BUN 13 10/22/2010   CREATININE 0.97 10/22/2010   CREATININE 1.12 06/29/2010    BP Readings from Last 3 Encounters:  10/22/10 190/117  07/07/10 176/97  03/25/10 182/104    Assessment: Hypertension control:  Elevated, although just slightly beyond baseline.  (his baseline). Patient without chest pain, shortness of breath, difficulty breathing, or other indications of acute end-organ damage. Progress toward goals:  deteriorated Barriers to meeting goals:  nonadherence to medications and has been off of Lisinopril for > 1 month 2/2 to not getting refills through his mail-order pharmacy.  Plan: Hypertension treatment:  continue current medications Check CMET today, restart Lisinopril. Patient may benefit from increased Lasix dosage as well given his diastolic hypertension. However, will proceed with step-wise escalation of therapy. Will also refer for outpatient cardiology consult, as patient with significant CAD, risk factors, pacemaker placement.

## 2010-10-23 NOTE — Assessment & Plan Note (Signed)
Patient must remain on a statin.  - Start Crestor. - Cardiology referral.

## 2010-10-23 NOTE — Assessment & Plan Note (Addendum)
Labs:    Component Value Date/Time   CHOL 171 06/29/2010   TRIG 107 06/29/2010   HDL 32 06/29/2010   LDL 118 06/29/2010   VLDL 44* 05/15/2009 2055   CHOLHDL 6.1 Ratio 05/15/2009 2055   Assessment: LDL goal of < 70 mg/dL. Despite maximum Lipitor therapy, pt not at goal. Additionally, patient out of Lipitor x 1 month 2/2 financial constraints and unmanageable expense.   Plan: Discussed case with Dr. Hilma Favors, who recommended Crestor once weekly dosing, as had been evidenced to be as efficacious in recent studies.  - Will change to Crestor 20mg  once weekly, will likely require escalation to 40mg   - Will check CMET to evaluate FLTs. - Patient not fasting today. Therefore, will plan to recheck FLP and LFTs in approximately 6-8 weeks,

## 2010-10-26 ENCOUNTER — Telehealth: Payer: Self-pay | Admitting: Dietician

## 2010-10-27 ENCOUNTER — Telehealth: Payer: Self-pay | Admitting: *Deleted

## 2010-10-27 NOTE — Telephone Encounter (Signed)
Call from [employer] from [employer].  Pt is unable to afford the Colcrys.  Can pt be put on Allopurinol instead at a lower cost.  Pt cannot afford his Lipitor.  Can pt be put on something less expensive.  Marylyn Ishihara can be reached at [phone] ext [phone] for questions.  Pt has not been taking his Cholesterol medication and has not had a check in a year.

## 2010-10-28 LAB — GLUCOSE, CAPILLARY: Glucose-Capillary: 447 mg/dL — ABNORMAL HIGH (ref 70–99)

## 2010-10-30 LAB — GLUCOSE, CAPILLARY: Glucose-Capillary: 334 mg/dL — ABNORMAL HIGH (ref 70–99)

## 2010-11-04 ENCOUNTER — Telehealth: Payer: Self-pay | Admitting: *Deleted

## 2010-11-04 DIAGNOSIS — E785 Hyperlipidemia, unspecified: Secondary | ICD-10-CM

## 2010-11-04 NOTE — Telephone Encounter (Signed)
Message left by patient that he needs assistance with diabetes medicine and needles.  lipitor and gout medicine. Not able to afford copay is 40.00 cochicine eliminated. [place]. Not getting meds through [facility] anymore.

## 2010-11-04 NOTE — Telephone Encounter (Signed)
Call from pt to [facility] requesting changes of his medication  to Pravastatin 40 and would like to get Allopurinol instead of Colcrys.  Uses [facility].

## 2010-11-05 ENCOUNTER — Ambulatory Visit (INDEPENDENT_AMBULATORY_CARE_PROVIDER_SITE_OTHER): Payer: Medicare HMO | Admitting: Dietician

## 2010-11-05 DIAGNOSIS — E1165 Type 2 diabetes mellitus with hyperglycemia: Secondary | ICD-10-CM

## 2010-11-05 DIAGNOSIS — E119 Type 2 diabetes mellitus without complications: Secondary | ICD-10-CM

## 2010-11-06 MED ORDER — PRAVASTATIN SODIUM 40 MG PO TABS: 40.0000 mg | ORAL_TABLET | Freq: Every evening | ORAL | Status: DC

## 2010-11-06 MED ORDER — ALLOPURINOL 100 MG PO TABS: 100.0000 mg | ORAL_TABLET | Freq: Every day | ORAL | Status: DC

## 2010-11-06 NOTE — Patient Instructions (Signed)
Eat smaller portions three times a day.  Check blood sugar before meals.   I will ask our front office to call you to make follow up appointment.

## 2010-11-06 NOTE — Progress Notes (Signed)
Medical Nutrition Therapy:  Appt start time: 1500 end time:  1600.  Assessment:  Primary concerns today: Weight management, Blood sugar control and cost of other medicines and juicing recipes  Usual eating pattern includes Meal 1 and 2+ snacks per day.      Avoided foods include: ot assessed today.     Usual physical activity includes walking 30 minutes a day 7 days per week  Diagnosis and Intervention:  Progress Towards Goal(s):  In progress   Nutritional Diagnosis:  [place]-3.3 Overweight/obesity As related to excess intake and lack of resources and activity.  As evidenced by patient's report.   Interventions: 1- discussed/educated patient on principles of weight loss, encouraged lower fat choices, eating at least 3 times a day and  smaller portions 2- Assisted patient with understanding what [employer] case manager had written down for him 3- Coordination of care- made referral to social work and triage nurse 4- Encouraged annual diabetes eye exam today    Monitoring/Evaluation:  Dietary intake in 4 week(s)

## 2010-11-06 NOTE — Telephone Encounter (Signed)
Met with patient on 11/05/10. Nurse working on taking care of medications that patient cannot afford and social work referral made for assistance with [place] and food stamp applications.

## 2010-11-09 ENCOUNTER — Ambulatory Visit: Payer: Medicare HMO | Admitting: Dietician

## 2010-11-09 LAB — GLUCOSE, CAPILLARY: Glucose-Capillary: 328 mg/dL — ABNORMAL HIGH (ref 70–99)

## 2010-11-16 IMAGING — CR DG CHEST 2V
2 series · 2 of 2 positions shown · non-contrast
Comparison: 01/31/2008 and earlier.

CLINICAL DATA: 62-year-old male with cough, weakness and shortness
of breath.

CHEST - 2 VIEW

[w chest pa *]
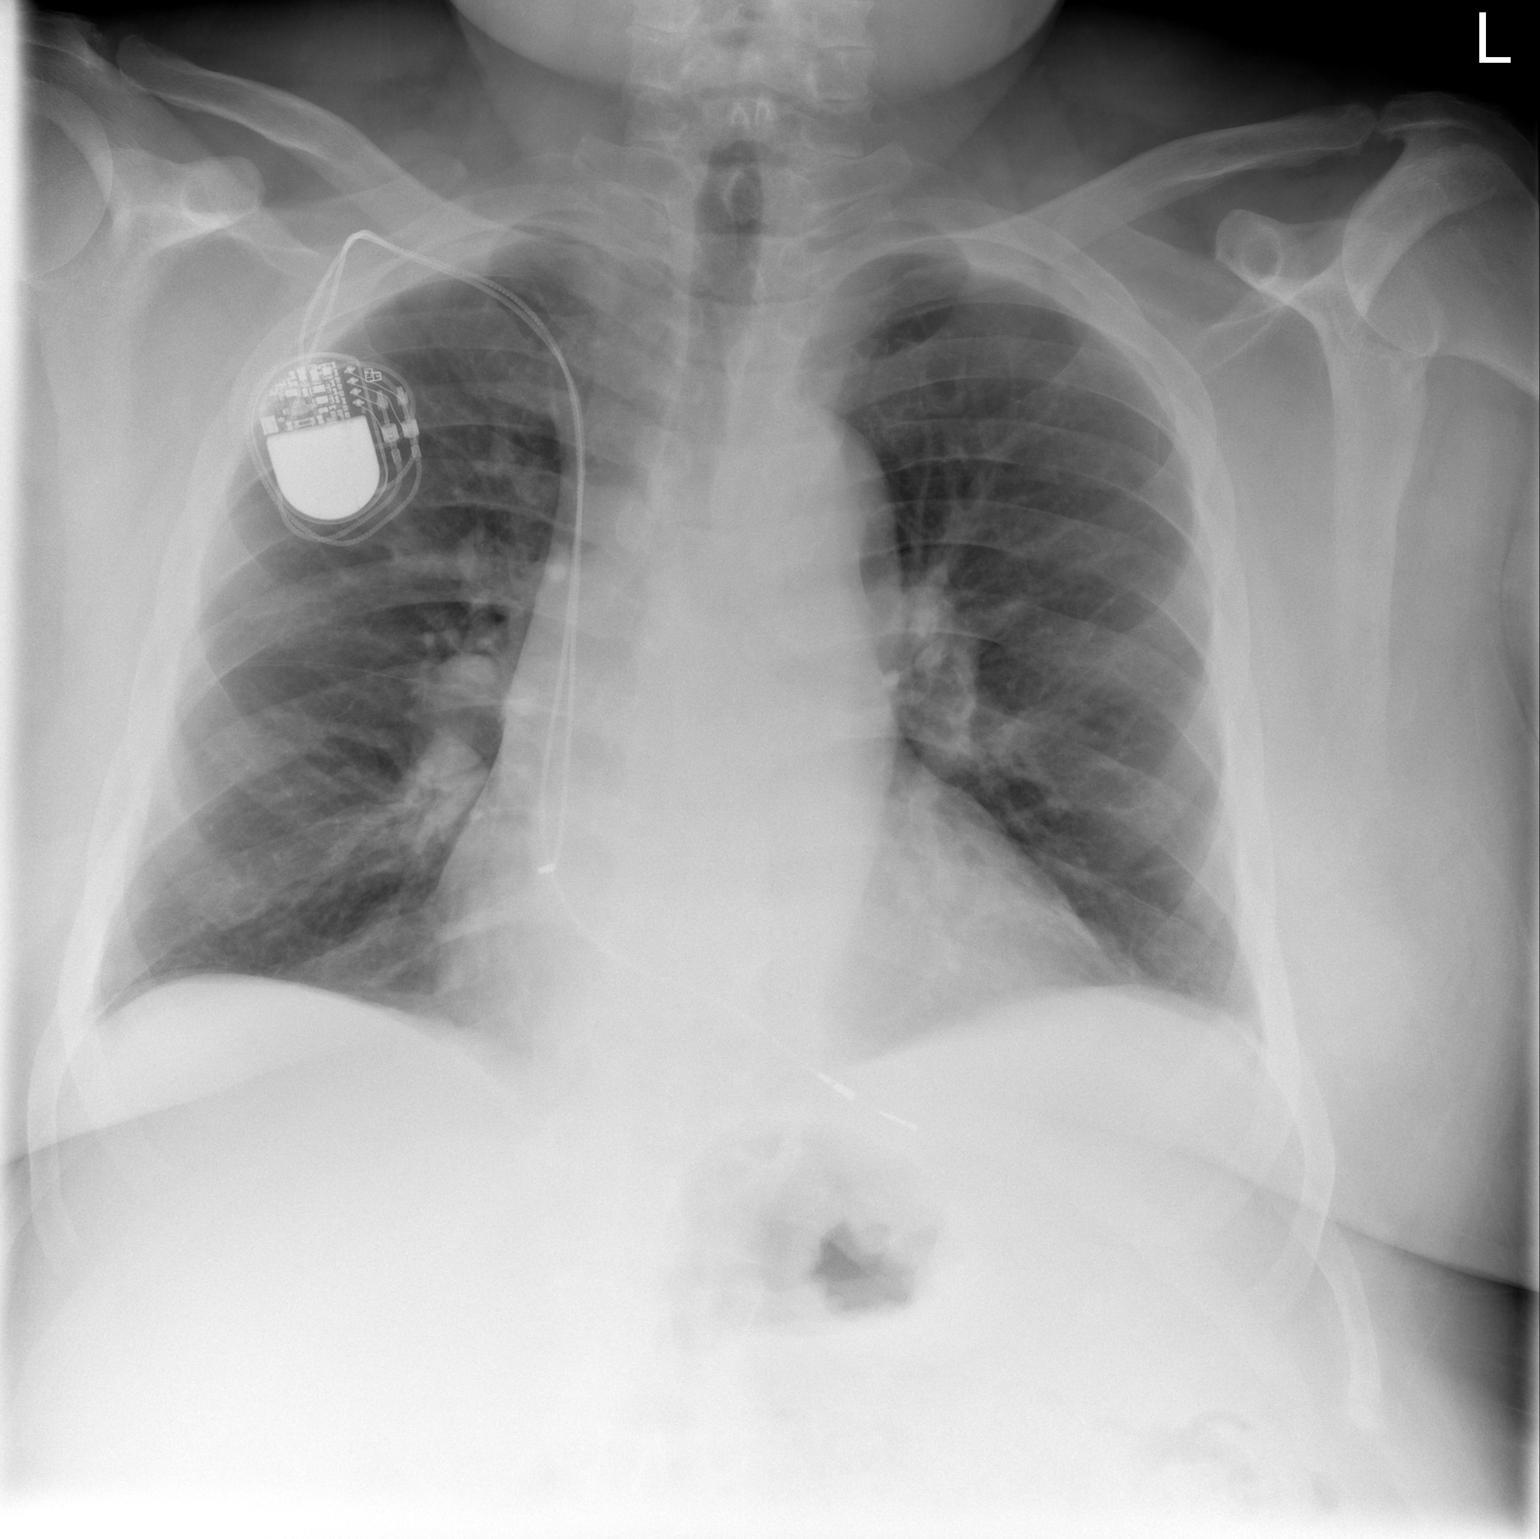

[w chest lat]
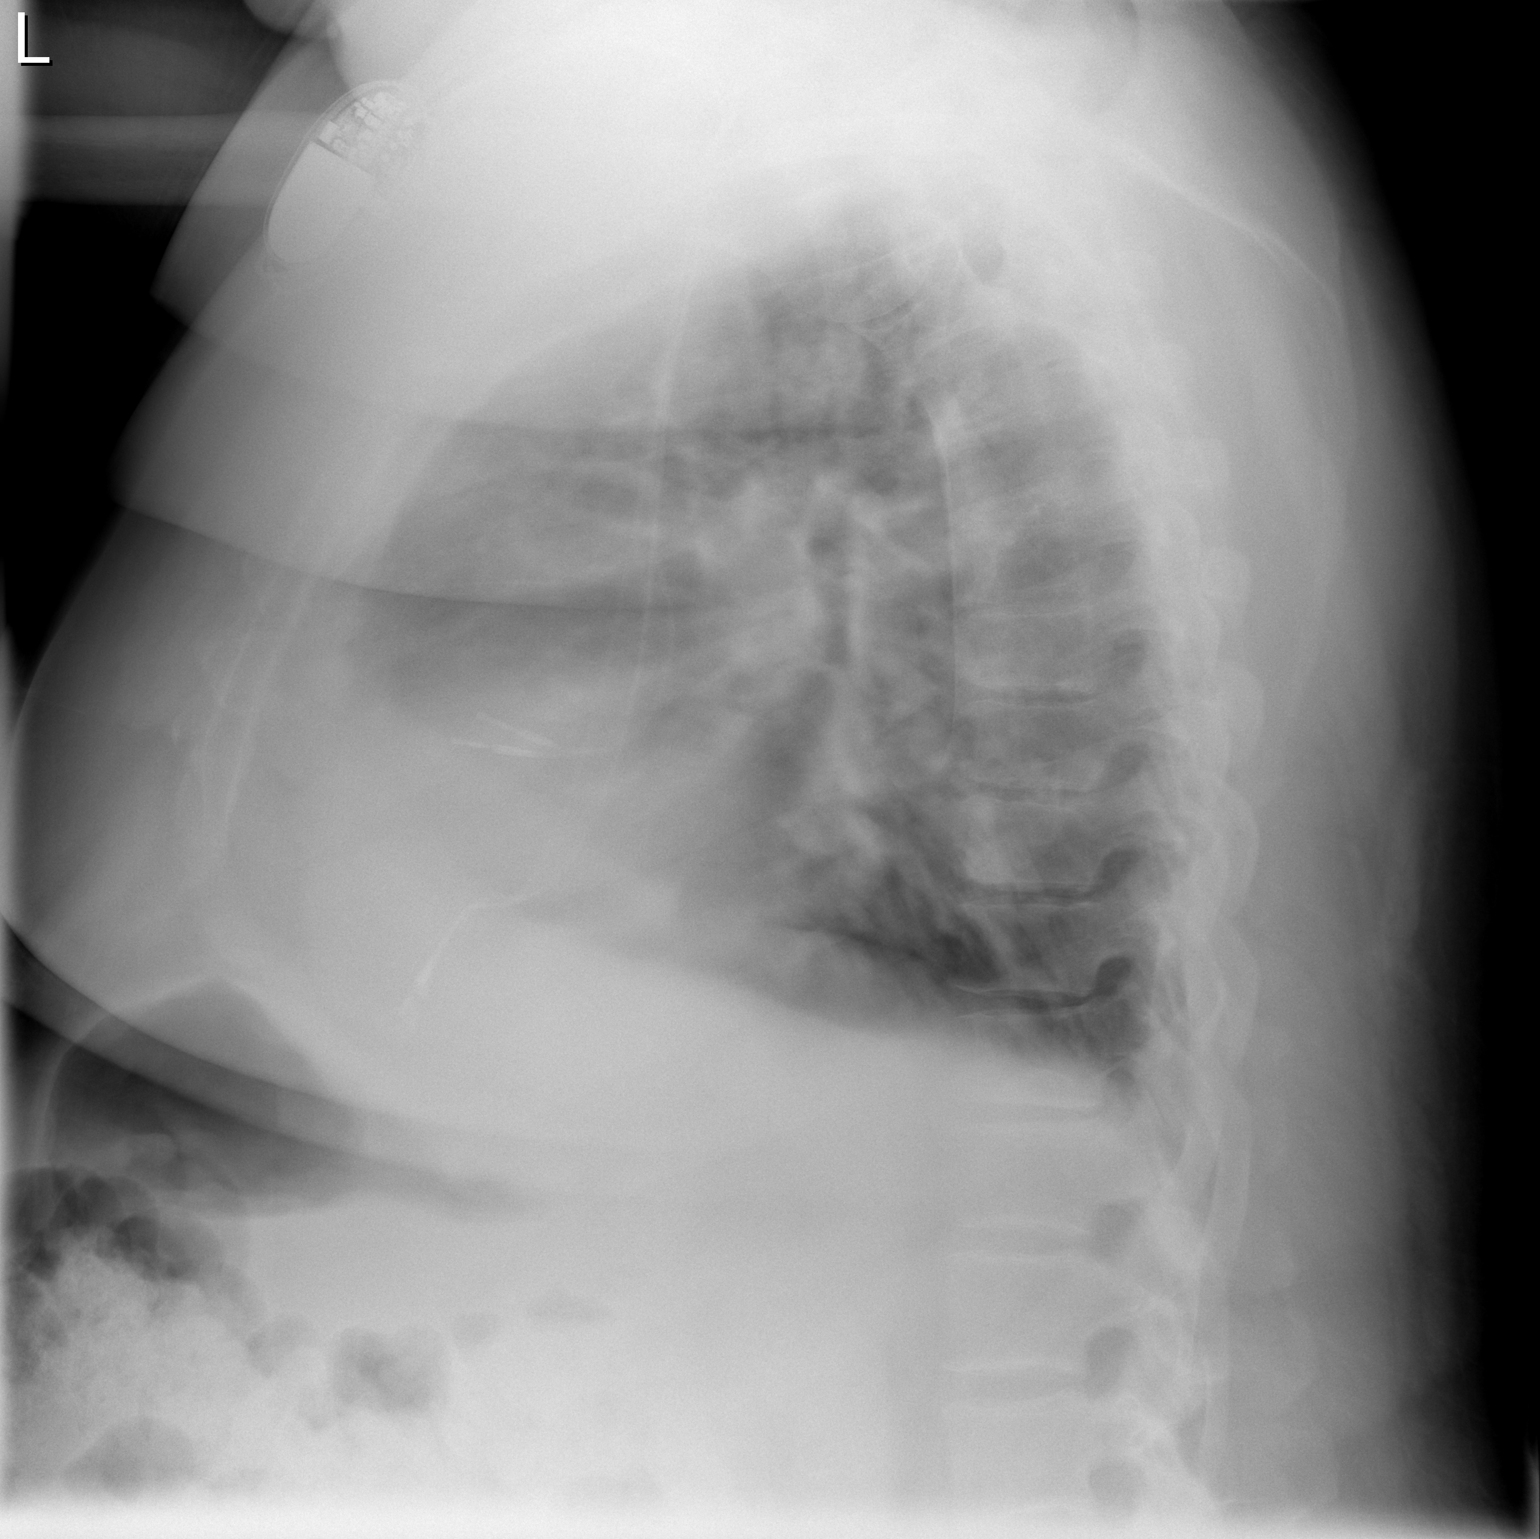

[2 of 2 positions shown; findings below may reference images not displayed]

FINDINGS: Stable right chest dual lead cardiac pacemaker.  Lower
lung volumes with respiratory motion artifact on the lateral view.
Stable cardiomegaly and mediastinal contour.  No pneumothorax,
pulmonary edema, pleural effusion or consolidation.  No acute
airspace opacity. Stable visualized osseous structures.
IMPRESSION: Lower lung volumes, otherwise no acute cardiopulmonary abnormality.

## 2010-11-17 ENCOUNTER — Telehealth: Payer: Self-pay | Admitting: Licensed Clinical Social Worker

## 2010-11-17 ENCOUNTER — Other Ambulatory Visit: Payer: Self-pay | Admitting: Internal Medicine

## 2010-11-17 ENCOUNTER — Telehealth: Payer: Self-pay | Admitting: *Deleted

## 2010-11-17 MED ORDER — COLCHICINE 0.6 MG PO TABS: 0.6000 mg | ORAL_TABLET | Freq: Every day | ORAL | Status: DC

## 2010-11-17 NOTE — Telephone Encounter (Signed)
Refilled his colcrys  Has a number of joint pains-hard to sort out over the phone if this is gout flare, inflammatory DJD, edema/morbid obesity/deconditioning  He is to come for appt tomorrow-already scheduled for that

## 2010-11-17 NOTE — Telephone Encounter (Signed)
Called Mr. Longmire and I will meet with him tomorrow at his 11:15 appmt with Dr. Cathren Laine and address referral issues.

## 2010-11-17 NOTE — Telephone Encounter (Signed)
Pt called with c/o pain both feet, unable to ambulate without pain and can only lay in bed. Onset of pain 1 week ago. He is taking allopurinol.  He is taking it every 3 or 4 hours !! He has no relief from pain. He has no other pain meds.  Feels like this is gout and allopurinol is not working.  He is asking for colcrys.  [facility] [facility]

## 2010-11-18 ENCOUNTER — Ambulatory Visit: Payer: Self-pay | Admitting: Licensed Clinical Social Worker

## 2010-11-18 ENCOUNTER — Ambulatory Visit (INDEPENDENT_AMBULATORY_CARE_PROVIDER_SITE_OTHER): Payer: Medicare HMO | Admitting: Internal Medicine

## 2010-11-18 ENCOUNTER — Encounter: Payer: Self-pay | Admitting: Internal Medicine

## 2010-11-18 VITALS — BP 136/81 | HR 82 | Temp 98.0°F

## 2010-11-18 DIAGNOSIS — M109 Gout, unspecified: Secondary | ICD-10-CM

## 2010-11-18 DIAGNOSIS — Z598 Other problems related to housing and economic circumstances: Secondary | ICD-10-CM

## 2010-11-18 MED ORDER — NAPROXEN 500 MG PO TBEC: 500.0000 mg | DELAYED_RELEASE_TABLET | Freq: Two times a day (BID) | ORAL | Status: DC

## 2010-11-18 NOTE — Assessment & Plan Note (Signed)
I would start Mr. Reineck on naproxen 500 mg twice daily with meals for next 7-10 days for acute gout attack. NSAIDs as first line therapy for gout attack and I do not see any contraindications in him. I would also stop aspirin and lisinopril for next 7 days ~time he takes his naproxen. Aspirin interferes with uric acid production and excretion. Lisinopril would be stopped just to be gentle on a patient's kidneys. I would not repeat any blood tests today and call the patient back in one week for a recheck visit or else earlier if patient's pain is worse.

## 2010-11-18 NOTE — Patient Instructions (Signed)
Gout Gout is an inflammatory condition (arthritis) caused by a buildup of uric acid crystals in the joints. Uric acid is a chemical that is normally present in the blood. Under some circumstances, uric acid can form into crystals in your joints. This causes joint redness, soreness, and swelling (inflammation). Repeat attacks are common. Over time, uric acid crystals can form into masses (tophi) near a joint, causing disfigurement. Gout is treatable and often preventable. CAUSES The disease begins with elevated levels of uric acid in the blood. Uric acid is produced by your body when it breaks down a naturally found substance called purines. This also happens when you eat certain foods such as meats and fish. Causes of an elevated uric acid level include:  Being passed down from parent to child (heredity).   Diseases that cause increased uric acid production (obesity, psoriasis, some cancers).   Excessive alcohol use.   Diet, especially diets rich in meat and seafood.   Medicines, including certain cancer-fighting drugs (chemotherapy), diuretics, and aspirin.   Chronic kidney disease. The kidneys are no longer able to remove uric acid well.   Problems with metabolism.  Conditions strongly associated with gout include:  Obesity.   High blood pressure.   High cholesterol.   Diabetes.  Not everyone with elevated uric acid levels gets gout. It is not understood why some people get gout and others do not. Surgery, joint injury, and eating too much of certain foods are some of the factors that can lead to gout. SYMPTOMS  An attack of gout comes on quickly. It causes intense pain with redness, swelling, and warmth in a joint.   Fever can occur.   Often, only one joint is involved. Certain joints are more commonly involved:   Base of the big toe.   Knee.   Ankle.   Wrist.   Finger.  Without treatment, an attack usually goes away in a few days to weeks. Between attacks, you usually  will not have symptoms, which is different from many other forms of arthritis. DIAGNOSIS Your caregiver will suspect gout based on your symptoms and exam. Removal of fluid from the joint (arthrocentesis) is done to check for uric acid crystals. Your caregiver will give you a medicine that numbs the area (local anesthetic) and use a needle to remove joint fluid for exam. Gout is confirmed when uric acid crystals are seen in joint fluid, using a special microscope. Sometimes, blood, urine, and X-ray tests are also used. TREATMENT There are 2 phases to gout treatment: treating the sudden onset (acute) attack and preventing attacks (prophylaxis). Treatment of an Acute Attack  Medicines are used. These include anti-inflammatory medicines or steroid medicines.   An injection of steroid medicine into the affected joint is sometimes necessary.   The painful joint is rested. Movement can worsen the arthritis.   You may use warm or cold treatments on painful joints, depending which works best for you.   Discuss the use of coffee, vitamin C, or cherries with your caregiver. These may be helpful treatment options.  Treatment to Prevent Attacks After the acute attack subsides, your caregiver may advise prophylactic medicine. These medicines either help your kidneys eliminate uric acid from your body or decrease your uric acid production. You may need to stay on these medicines for a very long time. The early phase of treatment with prophylactic medicine can be associated with an increase in acute gout attacks. For this reason, during the first few months of treatment, your caregiver  may also advise you to take medicines usually used for acute gout treatment. Be sure you understand your caregiver's directions. You should also discuss dietary treatment with your caregiver. Certain foods such as meats and fish can increase uric acid levels. Other foods such as dairy can decrease levels. Your caregiver can give  you a list of foods to avoid. HOME CARE INSTRUCTIONS  Do not take aspirin to relieve pain. This raises uric acid levels.   Only take over-the-counter or prescription medicines for pain, discomfort, or fever as directed by your caregiver.   Rest the joint as much as possible. When in bed, keep sheets and blankets off painful areas.   Keep the affected joint raised (elevated).   Use crutches if the painful joint is in your leg.   Drink enough water and fluids to keep your urine clear or pale yellow. This helps your body get rid of uric acid. Do not drink alcoholic beverages. They slow the passage of uric acid.   Follow your caregiver's dietary instructions. Pay careful attention to the amount of protein you eat. Your daily diet should emphasize fruits, vegetables, whole grains, and fat-free or low-fat milk products.   Maintain a healthy body weight.  SEEK MEDICAL CARE IF:  You have an oral temperature above 101F.   You develop diarrhea, vomiting, or any side effects from medicines.   You do not feel better in 24 hours, or you are getting worse.  SEEK IMMEDIATE MEDICAL CARE IF:  Your joint becomes suddenly more tender and you have:   Chills.   An oral temperature above 101F, not controlled by medicine.  MAKE SURE YOU:  Understand these instructions.   Will watch your condition.   Will get help right away if you are not doing well or get worse.  Document Released: 07/30/2000 Document Re-Released: 01/20/2010 [facility] Patient Information 2011 [place].

## 2010-11-18 NOTE — Progress Notes (Signed)
30 minutes. Met with [name] in the exam room for assessment and planning.  [name] makes about $61 from his Social Security Disability,  He has a [employer] fast streamer Medicare advantage plan and he' not very happy with the copays on some of his meds and specialist visits.   [name] informs me that he walks with aid of his walker and hires someone to come in and care for him paying out of his own pocket.  He has had this person for many years now.  He mentions [name] at [phone] as his daughter who could assist him in applying for Medicaid. Apparently, she once worked for [employer] and there will be no problem with her completing the paperwork.     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  His gout is also a problem right now/  [name] does get foodstamps and is recertifying for his section 8 housing.   Today I made a copy of the Medicaid application and he is certain that his daughter can complete the application.  He will likely qualify for [place] Medicaid which will pick up his Medicare B premium and open doors in terms of low income subsidy.   In the meantime, [name] tells me he has his gout medication which runs $40 per month.  He picked that up on April 3rd.  Sw will continue to follow.

## 2010-11-18 NOTE — Progress Notes (Signed)
  Subjective:    Patient ID: [name], male    DOB: 10/17/1945, 65 y.o.   MRN: [id number]  HPI patient is a 65 year old man patient Dr Donnella Bi with past medical history as described in the problem list.  Patient comes in today complaining that he has not been able to get to his colchicine for last 3 weeks because of the insurance issues. He states that he was ultimately able to fill his colchicine just yesterday. Patient states that over the last one week he has noticed increased pain in his left wrist along with inability to move, pain is 7-8/10 at its worse and is a worse with movements and relieved with rest. He also complains of pain in his both toes and also complaints of pain in his neck since last one week.  No other complaints.    Review of Systems  Constitutional: Negative for fever, activity change and appetite change.  HENT: Negative for sore throat.   Respiratory: Negative for cough and shortness of breath.   Cardiovascular: Negative for chest pain and leg swelling.  Gastrointestinal: Negative for nausea, abdominal pain, diarrhea, constipation and abdominal distention.  Genitourinary: Negative for frequency, hematuria and difficulty urinating.  Neurological: Negative for dizziness and headaches.  Psychiatric/Behavioral: Negative for suicidal ideas and behavioral problems.       Objective:   Physical Exam  Constitutional: He is oriented to person, place, and time. He appears well-developed and well-nourished.  HENT:  Head: Normocephalic and atraumatic.  Eyes: Conjunctivae and EOM are normal. Pupils are equal, round, and reactive to light. No scleral icterus.  Neck: Normal range of motion. Neck supple. No JVD present. No thyromegaly present.  Cardiovascular: Normal rate, regular rhythm, normal heart sounds and intact distal pulses.  Exam reveals no gallop and no friction rub.   No murmur heard. Pulmonary/Chest: Effort normal and breath sounds normal. No respiratory  distress. He has no wheezes. He has no rales.  Abdominal: Soft. Bowel sounds are normal. He exhibits no distension and no mass. There is no tenderness. There is no rebound and no guarding.  Musculoskeletal: He exhibits edema and tenderness.       Patient has swelling and tenderness in his left wrist joint without redness. Patient also has tenderness in his right toe without increased swelling or redness.  Lymphadenopathy:    He has no cervical adenopathy.  Neurological: He is alert and oriented to person, place, and time.  Psychiatric: He has a normal mood and affect. His behavior is normal.          Assessment & Plan:

## 2010-11-19 LAB — GLUCOSE, CAPILLARY: Glucose-Capillary: 291 mg/dL — ABNORMAL HIGH (ref 70–99)

## 2010-11-20 LAB — GLUCOSE, CAPILLARY: Glucose-Capillary: 406 mg/dL — ABNORMAL HIGH (ref 70–99)

## 2010-11-24 ENCOUNTER — Other Ambulatory Visit: Payer: Self-pay | Admitting: *Deleted

## 2010-11-24 LAB — GLUCOSE, CAPILLARY: Glucose-Capillary: 421 mg/dL — ABNORMAL HIGH (ref 70–99)

## 2010-11-25 ENCOUNTER — Encounter (INDEPENDENT_AMBULATORY_CARE_PROVIDER_SITE_OTHER): Payer: Medicare HMO | Admitting: Cardiology

## 2010-11-25 ENCOUNTER — Ambulatory Visit: Payer: Medicare HMO | Admitting: Cardiology

## 2010-11-25 ENCOUNTER — Encounter: Payer: Self-pay | Admitting: Cardiology

## 2010-11-25 DIAGNOSIS — R0989 Other specified symptoms and signs involving the circulatory and respiratory systems: Secondary | ICD-10-CM

## 2010-11-26 ENCOUNTER — Other Ambulatory Visit: Payer: Self-pay | Admitting: *Deleted

## 2010-11-26 ENCOUNTER — Telehealth: Payer: Self-pay | Admitting: Dietician

## 2010-11-26 ENCOUNTER — Ambulatory Visit: Payer: Medicare HMO | Admitting: Internal Medicine

## 2010-11-26 ENCOUNTER — Ambulatory Visit: Payer: Medicare HMO | Admitting: Dietician

## 2010-11-26 LAB — GLUCOSE, CAPILLARY: Glucose-Capillary: 134 mg/dL — ABNORMAL HIGH (ref 70–99)

## 2010-11-27 NOTE — Telephone Encounter (Signed)
Follow up on MNT/diabetes self care: Spoke with patient about missed appointment with Rd, CDE, medicine and diabetes eye exam. He thought MNT appointment had been cancelled.  Has diabetic eye appointment scheduled the last week in April. He says he is working out getting the medicines he was previously unable to afford. Encouraged patient to make a follow up appointment with RD,CDE

## 2010-11-30 LAB — GLUCOSE, CAPILLARY
Glucose-Capillary: 233 mg/dL — ABNORMAL HIGH (ref 70–99)
Glucose-Capillary: 249 mg/dL — ABNORMAL HIGH (ref 70–99)
Glucose-Capillary: 268 mg/dL — ABNORMAL HIGH (ref 70–99)
Glucose-Capillary: 269 mg/dL — ABNORMAL HIGH (ref 70–99)
Glucose-Capillary: 274 mg/dL — ABNORMAL HIGH (ref 70–99)
Glucose-Capillary: 292 mg/dL — ABNORMAL HIGH (ref 70–99)
Glucose-Capillary: 314 mg/dL — ABNORMAL HIGH (ref 70–99)
Glucose-Capillary: 331 mg/dL — ABNORMAL HIGH (ref 70–99)
Glucose-Capillary: 240 mg/dL — ABNORMAL HIGH (ref 70–99)

## 2010-11-30 LAB — CBC
HCT: 43.5 % (ref 39.0–52.0)
HCT: 44.1 % (ref 39.0–52.0)
Hemoglobin: 13.6 g/dL (ref 13.0–17.0)
MCHC: 31.5 g/dL (ref 30.0–36.0)
MCHC: 31.7 g/dL (ref 30.0–36.0)
MCV: 83 fL (ref 78.0–100.0)
MCV: 83.1 fL (ref 78.0–100.0)
Platelets: 139 10*3/uL — ABNORMAL LOW (ref 150–400)
Platelets: 150 10*3/uL (ref 150–400)
RBC: 5.15 MIL/uL (ref 4.22–5.81)
RBC: 5.31 MIL/uL (ref 4.22–5.81)
RDW: 14.1 % (ref 11.5–15.5)
WBC: 11 10*3/uL — ABNORMAL HIGH (ref 4.0–10.5)
WBC: 11.3 10*3/uL — ABNORMAL HIGH (ref 4.0–10.5)
WBC: 9.9 10*3/uL (ref 4.0–10.5)

## 2010-11-30 LAB — BASIC METABOLIC PANEL
BUN: 12 mg/dL (ref 6–23)
CO2: 34 mEq/L — ABNORMAL HIGH (ref 19–32)
Calcium: 9.1 mg/dL (ref 8.4–10.5)
Calcium: 9.3 mg/dL (ref 8.4–10.5)
Chloride: 98 mEq/L (ref 96–112)
GFR calc Af Amer: >60 mL/min (ref >60)
GFR calc non Af Amer: >60 mL/min (ref >60)
Potassium: 3.8 mEq/L (ref 3.5–5.1)
Sodium: 139 mEq/L (ref 135–145)

## 2010-11-30 LAB — WOUND CULTURE

## 2010-11-30 LAB — CULTURE, BLOOD (ROUTINE X 2): Culture: NO GROWTH

## 2010-11-30 LAB — CARDIAC PANEL(CRET KIN+CKTOT+MB+TROPI)
Total CK: 130 U/L (ref 7–232)
Total CK: 160 U/L (ref 7–232)
Troponin I: 0.02 ng/mL (ref 0.00–0.06)
Troponin I: 0.03 ng/mL (ref 0.00–0.06)

## 2010-11-30 LAB — HEMOGLOBIN A1C
Hgb A1c MFr Bld: 13.1 % — ABNORMAL HIGH (ref 4.6–6.1)
Mean Plasma Glucose: 329 mg/dL

## 2010-11-30 LAB — DIFFERENTIAL
Basophils Absolute: 0 10*3/uL (ref 0.0–0.1)
Eosinophils Absolute: 0.2 10*3/uL (ref 0.0–0.7)
Eosinophils Relative: 1 % (ref 0–5)
Lymphocytes Relative: 18 % (ref 12–46)
Lymphs Abs: 2 10*3/uL (ref 0.7–4.0)
Lymphs Abs: 2.2 10*3/uL (ref 0.7–4.0)
Monocytes Relative: 8 % (ref 3–12)
Neutro Abs: 8 10*3/uL — ABNORMAL HIGH (ref 1.7–7.7)
Neutrophils Relative %: 67 % (ref 43–77)
Neutrophils Relative %: 71 % (ref 43–77)

## 2010-11-30 LAB — COMPREHENSIVE METABOLIC PANEL
Albumin: 2.9 g/dL — ABNORMAL LOW (ref 3.5–5.2)
Alkaline Phosphatase: 75 U/L (ref 39–117)
BUN: 14 mg/dL (ref 6–23)
Potassium: 3.6 mEq/L (ref 3.5–5.1)
Sodium: 135 mEq/L (ref 135–145)
Total Protein: 6.9 g/dL (ref 6.0–8.3)

## 2010-11-30 LAB — VANCOMYCIN, TROUGH: Vancomycin Tr: 8.6 ug/mL — ABNORMAL LOW (ref 10.0–20.0)

## 2010-11-30 LAB — TSH: TSH: 1.305 u[IU]/mL (ref 0.350–4.500)

## 2010-12-01 LAB — GLUCOSE, CAPILLARY

## 2010-12-03 IMAGING — CR DG TIBIA/FIBULA 2V*R*
4 series · 4 of 4 positions shown · non-contrast
Comparison: Right knee x-rays 01/31/2008 and 05/07/2005.

CLINICAL DATA: Diabetic with right lower leg wound without healing.

RIGHT TIBIA AND FIBULA - 2 VIEW 08/21/2008:

[t tib/fib ap right]
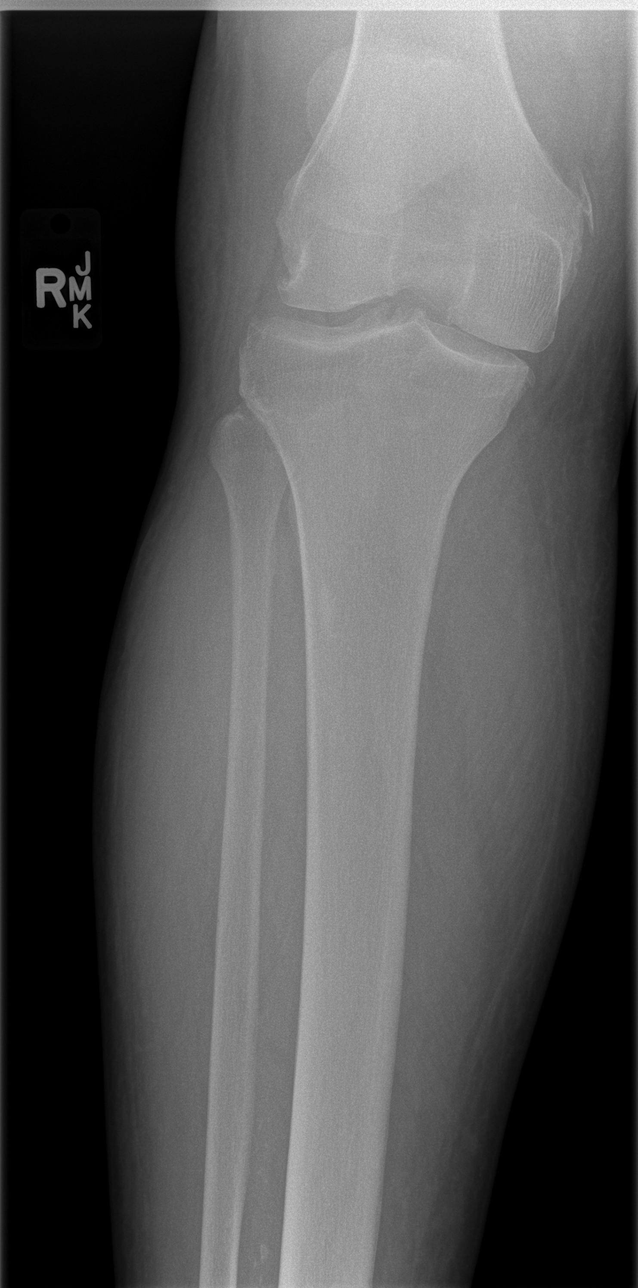

[t tib/fib ap right *]
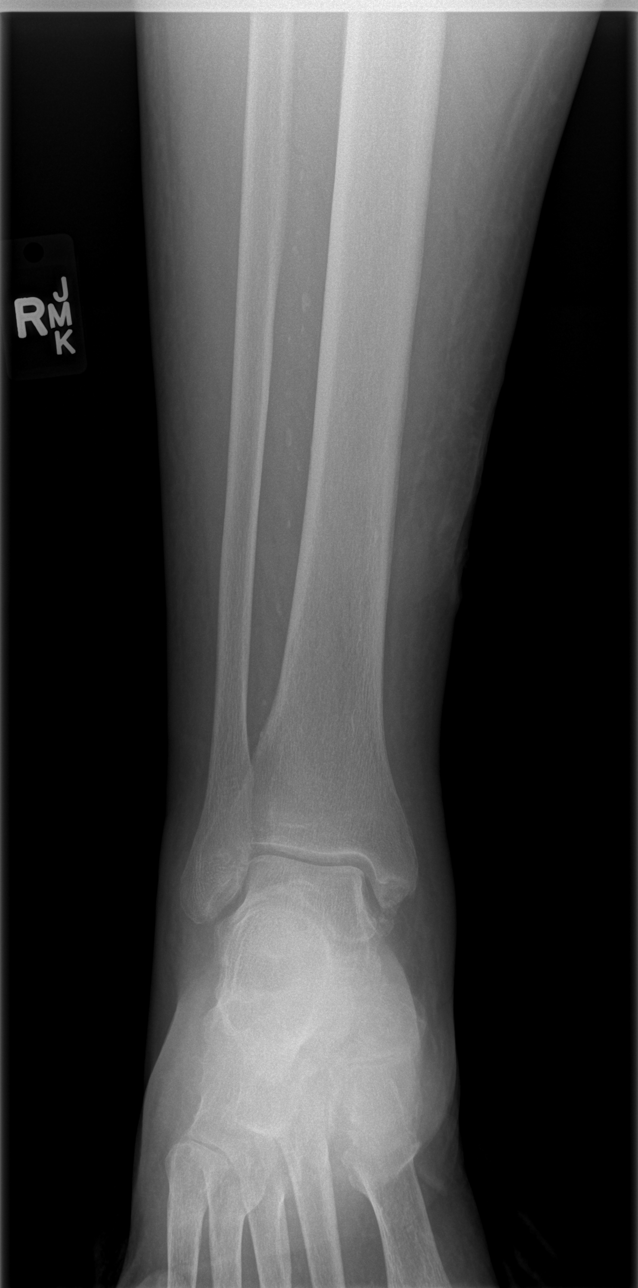

[t tib/fib lat right (1 of 2)]
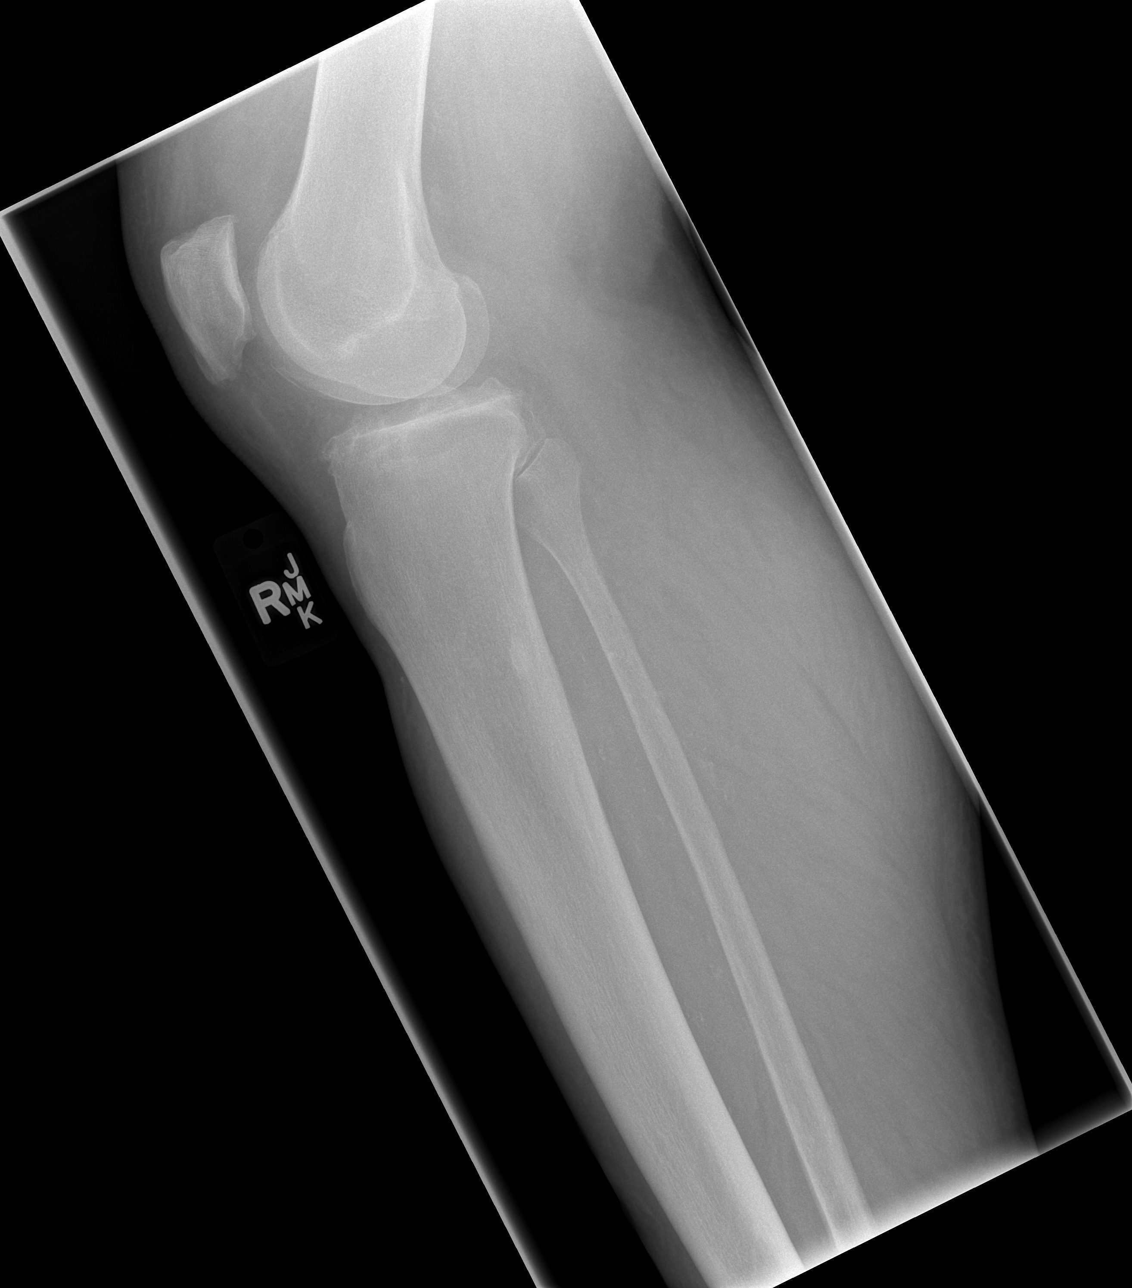

[t tib/fib lat right (2 of 2)]
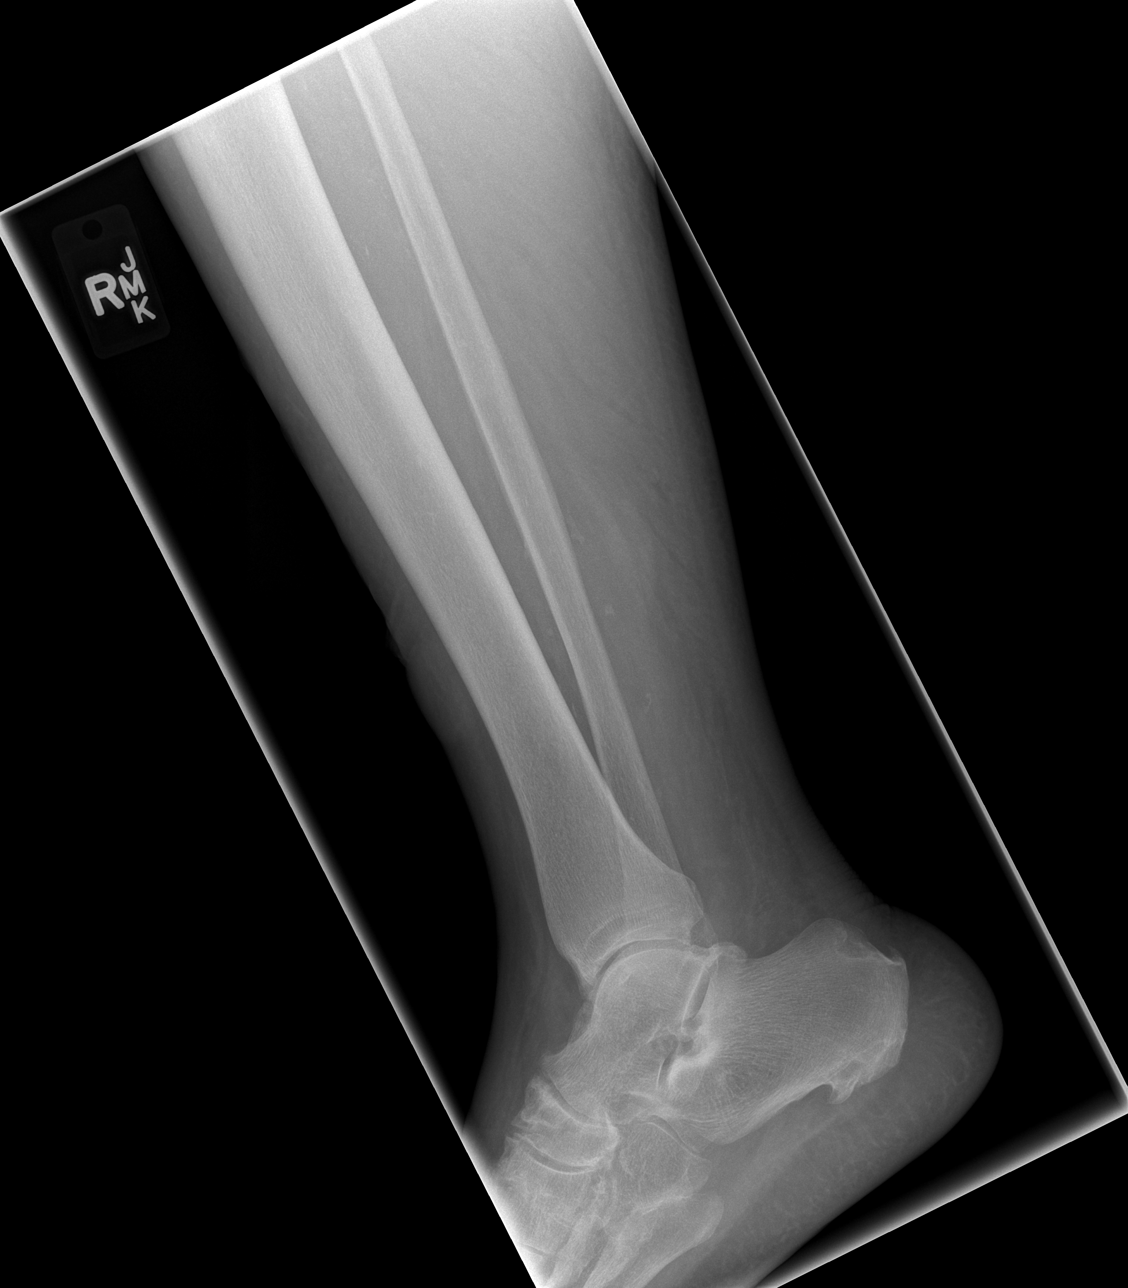

[4 of 4 positions shown; findings below may reference images not displayed]

FINDINGS: Small soft tissue ulceration anteriorly overlying the
distal tibial metaphysis.  No underlying skeletal abnormality to
suggest osteomyelitis.  Mild generalized osteopenia.  Degenerative
changes about the knee joint with calcification at the insertion of
the medial collateral ligament on the medial femoral condyle
(Pelligrini-Stieda).
IMPRESSION: No acute skeletal abnormalities.  Specifically, no radiographic
evidence of osteomyelitis involving the distal tibia at the site of
the soft tissue ulceration.

## 2010-12-03 IMAGING — CR DG CHEST 2V
2 series · 2 of 2 positions shown · non-contrast
Comparison: 08/13/2008

CLINICAL DATA: Productive cough.  Congestion.  Shortness of breath
appear

CHEST - 2 VIEW

[w chest pa *]
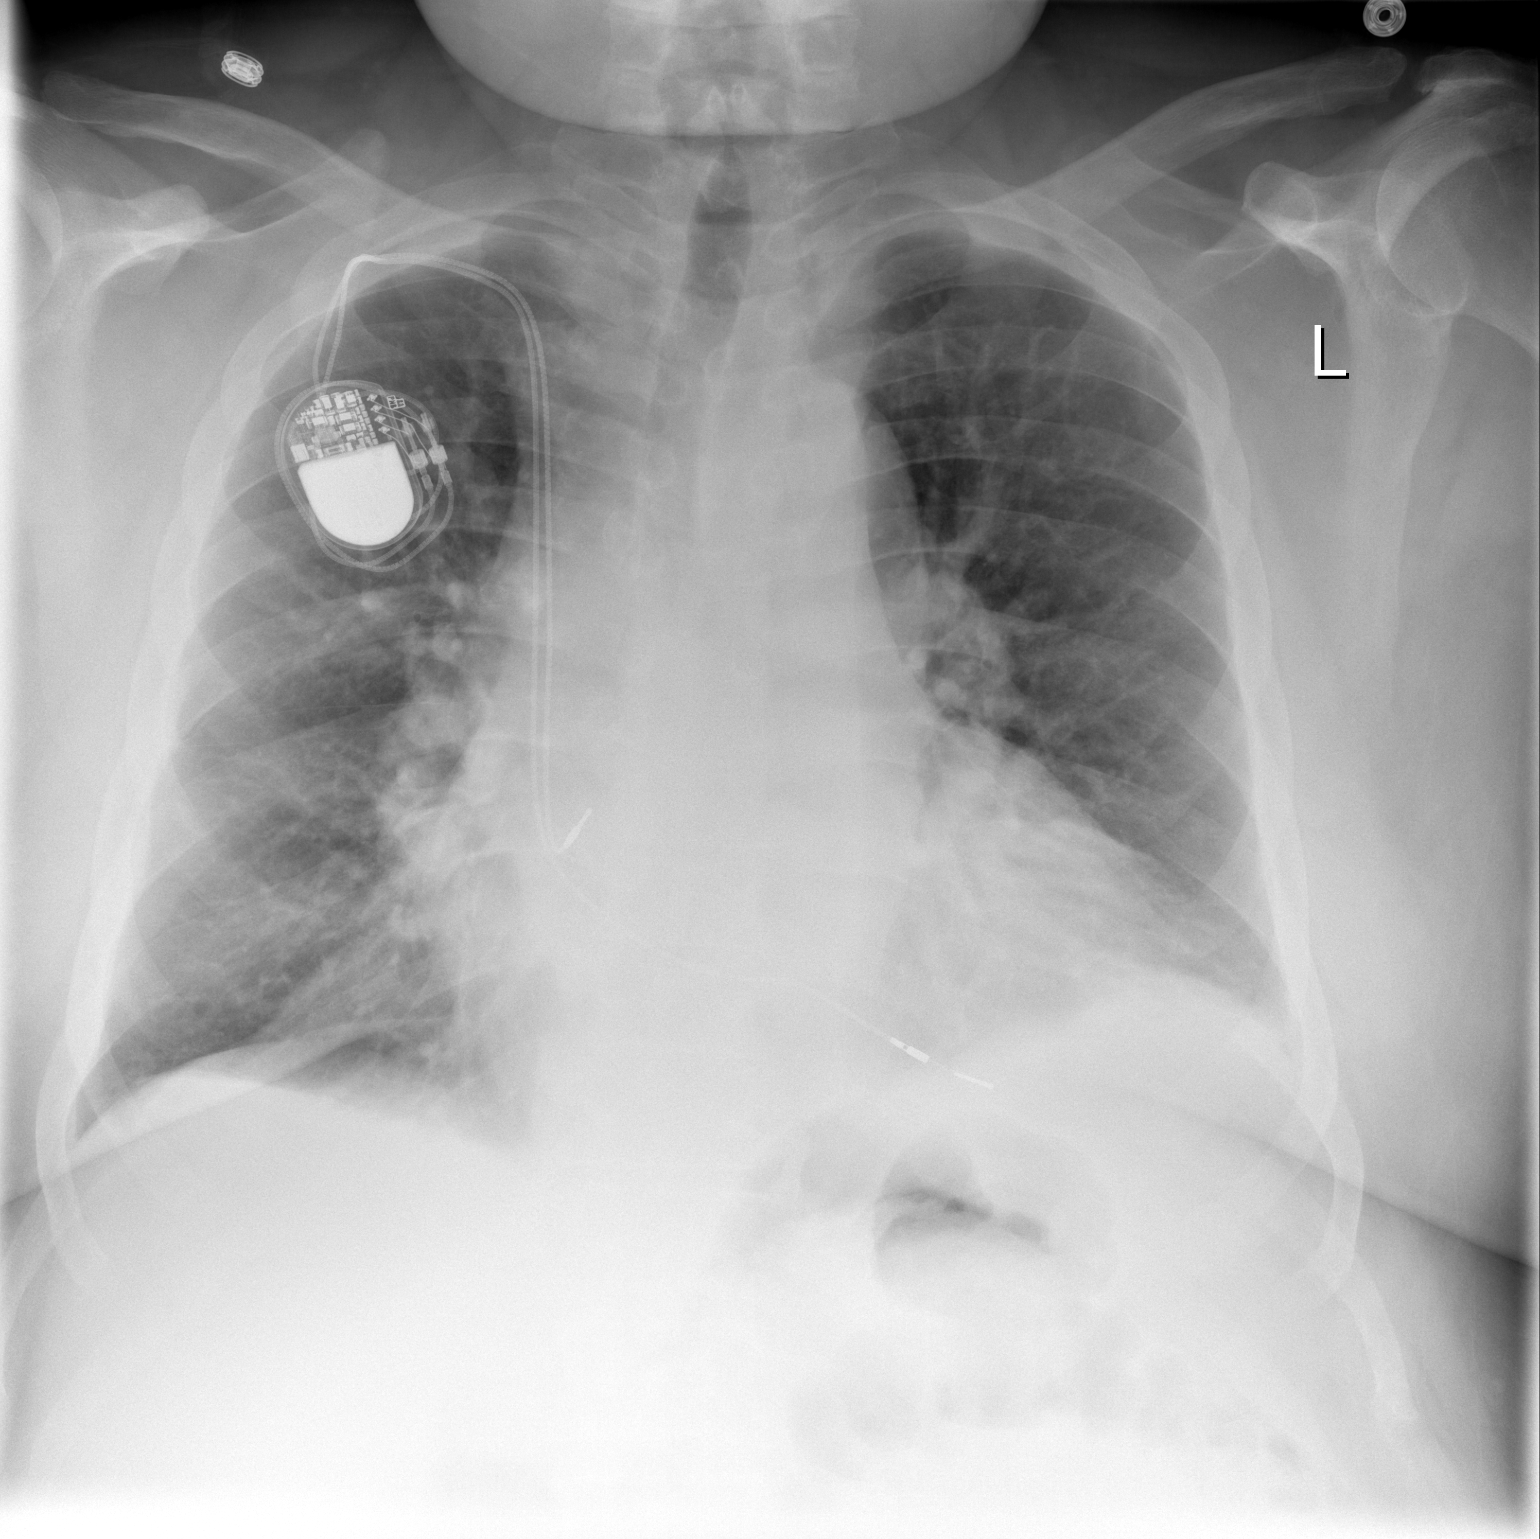

[w chest lat *]
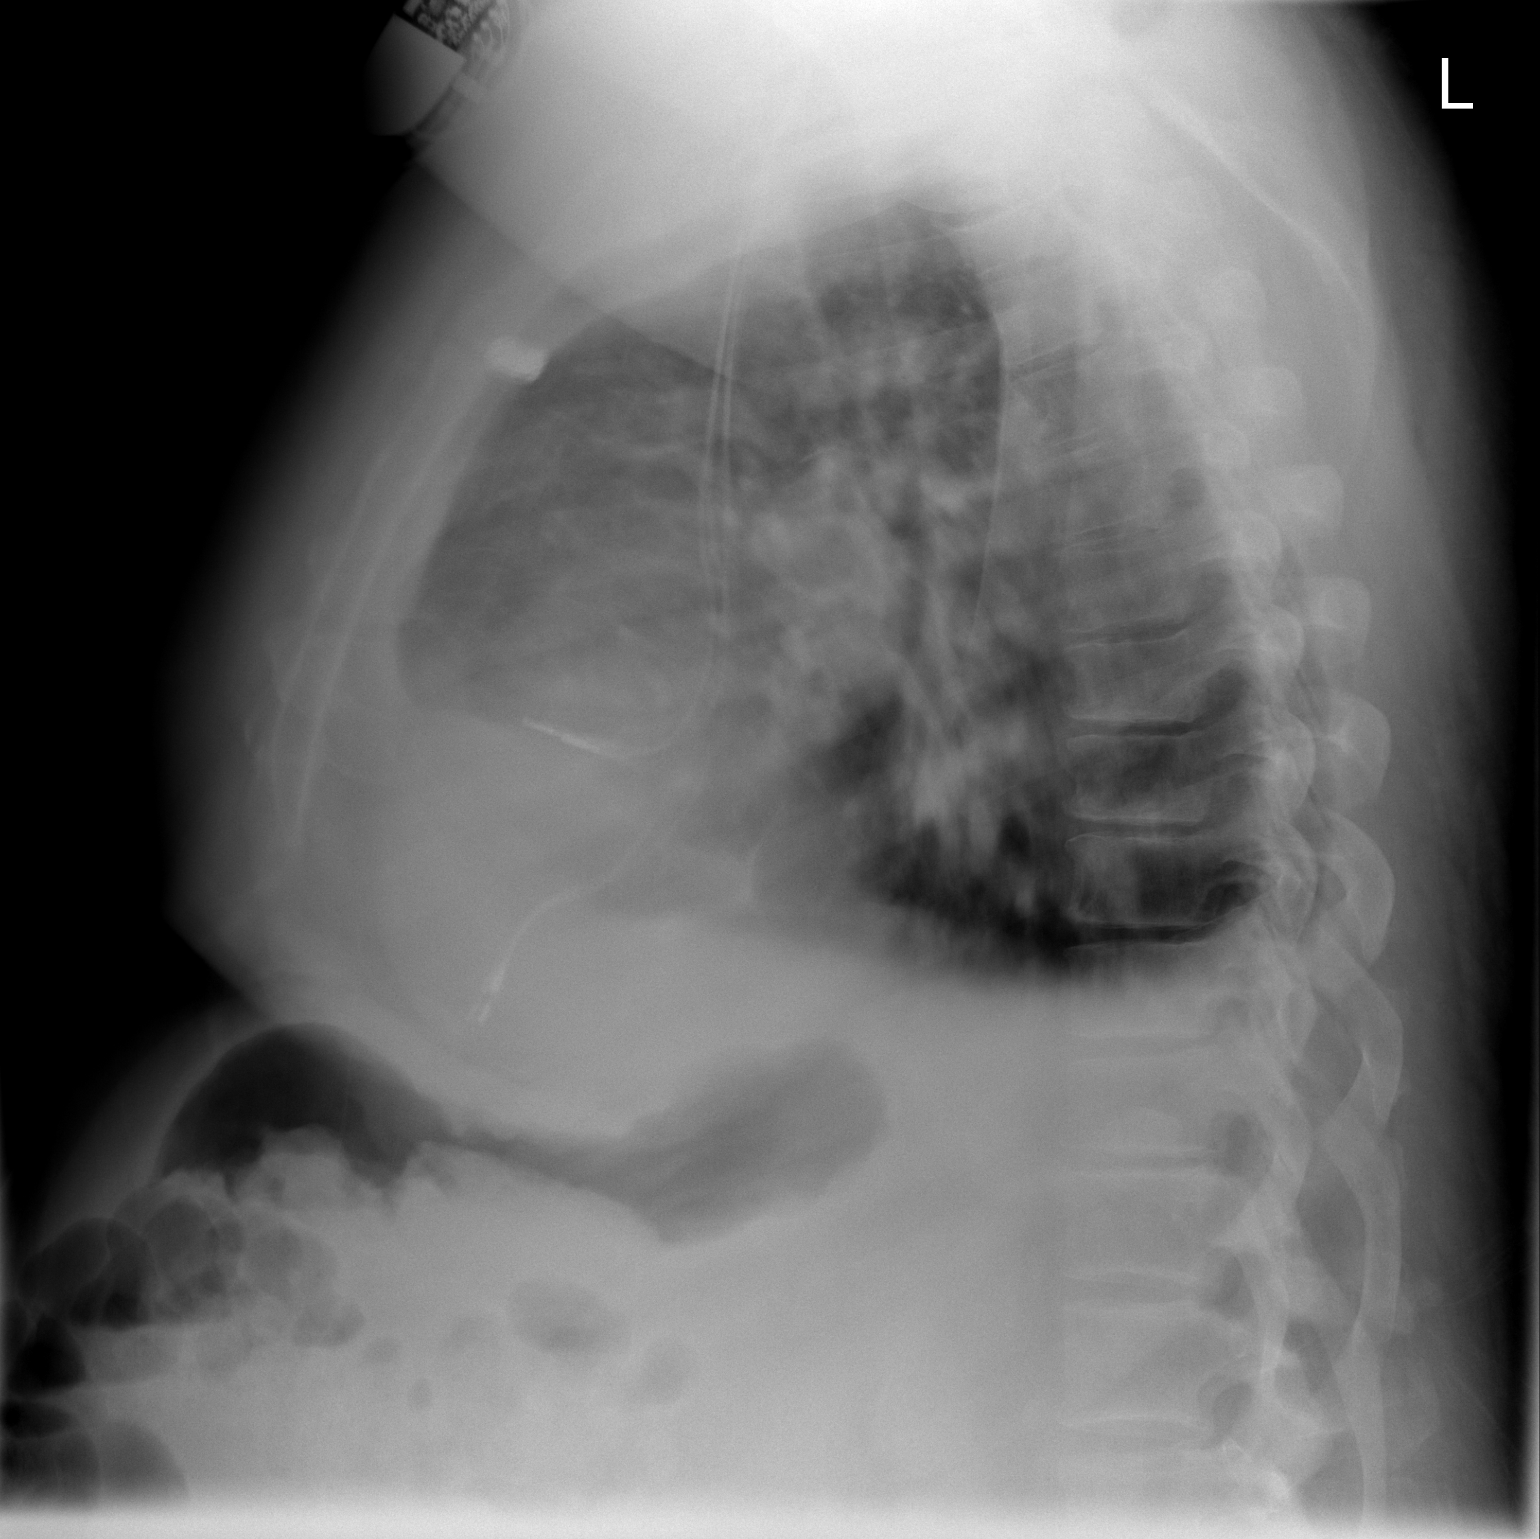

[2 of 2 positions shown; findings below may reference images not displayed]

FINDINGS: Cardiomegaly is present.  A dual lead cardiac pacer is
present and stable in position.  There is pulmonary venous
hypertension and borderline interstitial edema.

Mildly confluent opacity at the left lung base likely represents
atelectasis but could represent mild pneumonia.  The lateral view
is obscured by motion artifact.
IMPRESSION: 1.  Cardiomegaly with pulmonary venous hypertension and borderline
interstitial edema.
2.  Mild left basilar atelectasis versus pneumonia.

## 2011-01-01 ENCOUNTER — Other Ambulatory Visit: Payer: Self-pay | Admitting: Dietician

## 2011-01-01 MED ORDER — INSULIN LISPRO 100 UNIT/ML ~~LOC~~ SOLN: SUBCUTANEOUS | Status: DC

## 2011-01-01 NOTE — Consult Note (Signed)
NAME:  RHYAN, AMMANN NO.:  [id number]   MEDICAL RECORD NO.:  [id number]                   PATIENT TYPE:  OIB   LOCATION:  [id number]                                 FACILITY:  [facility]   PHYSICIAN:  Clinton D. Annamaria Boots, M.D.              DATE OF BIRTH:  1945/09/13   DATE OF CONSULTATION:  01/10/2004  DATE OF DISCHARGE:  01/11/2004                                   CONSULTATION   SLEEP MEDICINE CONSULTATION   PROBLEM FOR CONSULTATION:  Consult requested by Dr. Rollene Fare to establish  therapy for obstructive sleep apnea.   HISTORY:  This is a 65 year old man followed [facility] primary [facility] and admitted by Dr. Rollene Fare for  pacemaker revision.  He had had a history of disrupted nighttime sleep and  significant daytime sleepiness with snoring.  A diagnostic nocturnal  polysomnogram was performed at the [facility] on October 26, 2003, recording 277 minutes of sleep, including 12% REM.  He had an RDI of 91 obstructive events per hour, nonpositional.  He had to  frequently sit up on the edge of his bed in his sleep.  Oxygen desaturated  to 75% on room air with moderate snoring.  Heart rhythm was paced with  occasional PVC.  Return for CPAP titration was suggested.   REVIEW OF SYSTEMS:  Weight is stable, morbidly obese.  Nighttimes sleep is  around 7 or 8 p.m.; then he is up frequently for nocturia during the night  and often will sit up to watch TV for a couple of hours with these intervals  before he goes back to bed.  It is hard to estimate his total sleep.  He  tends to be drowsy during the day and has been told he snores loudly.  He  does not usually wake with headache.   PAST MEDICAL HISTORY:  1. Coronary artery disease.  2. Pacemaker.  3. COPD, as reported, although this was not known to the patient.  4. Hypertension.  5. Hyperlipidemia.  6. Obstructive sleep apnea.  7. Gout.  8.  Gastroesophageal reflux disease.  9. Diabetes mellitus.   SOCIAL:  He smokes occasional cigarettes of 1-2 a day usually when I have a  drink.   FAMILY:  Coronary artery disease.  Mother died of CHF.  Several in the  family are described as always sleepy.   PHYSICAL EXAMINATION:  VITAL SIGNS:  BP 146/72.  Pulse regular 60.  Respirations 18.  Room-air oxygen saturation 97%.  GENERAL:  He is massively obese.  MENTATION:  Alert, oriented with clear speech, cooperative.  HEENT:  Neck size reported 18-20.  It is a short thick neck with neck vein  distention, no stridor, no palpable thyromegaly.  He has a thick tongue.  Palate length is 4/4.  CHEST:  Quiet, clear and very shallow breathing, consistent with his morbid  obesity.  HEART:  Heart sounds are regular, faintly heard at the xiphoid and left  parasternal area without murmur or gallop.  ABDOMEN:  Morbidly obese.  I cannot judge liver or spleen.  EXTREMITIES:  Heavy legs with suggestion of stasis changes.   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  Hemoglobin was  14.6.  TSH 1.5.  Glucose elevated at 249, BUN 21, creatinine 1.2.  No  proteinuria.   IMPRESSION:  1. Obstructive sleep apnea with hypersomnolence.  2. Morbid obesity.  3. Coronary disease/pacemaker revision.  4. Diabetes.  5. Hypertension.  6. Other medical problems as noted.   PLAN:  I discussed the physiology and available treatment for obstructive  sleep apnea.  Since his hospital discharge is expected within the next day  or so, I have suggested that we set up the CPAP autotitration through a home  care company to be followed up and adjusted with conversion to fixed-  pressure CPAP through my office.  I will help with those arrangements.   Thank you for the chance to meet Mr. Rotman.                                               Clinton D. Annamaria Boots, M.D.    CDY/MEDQ  D:  01/10/2004  T:  01/12/2004  Job:  [id number]   cc:   Delfino Lovett A.  Rollene Fare, M.D.  [phone] [address], [place] [address]  Fax: [facility]

## 2011-01-01 NOTE — Cardiovascular Report (Signed)
NAME:  [name], [name] NO.:  [id number]   MEDICAL RECORD NO.:  [id number]                   PATIENT TYPE:  INP   LOCATION:  3702                                 FACILITY:  [facility]   PHYSICIAN:  Jeanella Craze. Little, M.D.              DATE OF BIRTH:  February 27, 1946   DATE OF PROCEDURE:  10/22/2003  DATE OF DISCHARGE:                              CARDIAC CATHETERIZATION   INDICATIONS FOR TEST:  [name] is a 65 year old obese hypertensive  diabetic male who has sleep apnea.  Weighs 350 pounds.  Has a family history  of a brother who has already had bypass surgery.  He was admitted and noted  to have recurrent problems with 3-5 second pauses that occur while he is  having apneic spells.  A dobutamine Cardiolite study was performed, but did  not reach predicted heart rate.  The nuclear study did not show ischemia,  but because of his risk factors he is brought to the cath lab.   The patient was prepped and draped in the usual sterile fashion exposing the  right groin. Following local anesthetic with 1% Xylocaine, the Seldinger  technique was employed and a 5 [place] introducer sheath was placed into the  right femoral artery.  Left and right coronary arteriography,  ventriculography and a distal aortogram was performed.   EQUIPMENT:  5 French Judkins configuration catheters.  The right coronary  catheter comes off anteriorly.  It could not be cannulated with a Karrer  right or No-To catheter.  Finally, a 5-cm right Judkins catheter was able to  cannulate it.   TOTAL CONTRAST:  260 mL.   COMPLICATIONS:  None.   RESULTS:   HEMODYNAMIC MONITORING:  Central aortic pressure was 143/89.  Left  ventricular pressure was 141/13 with no aortic valve gradient noted at the  time of pullback.   VENTRICULOGRAPHY:  Ventriculography in the RAO projection using 30 mL of  contrast at 15 mL per second revealed the left ventricular cavity to be  slightly dilated.  There  appeared to be normal wall motion.  Ejection  fraction was greater than 55%.  The end-diastolic pressure was 18.   DISTAL AORTOGRAM:  The distal aortogram done at the level of the 12th rib  revealed marked tortuosity of the aorta.  The renal arteries are only  faintly visualized, particularly the right.  There was no evidence of  abdominal aortic aneurysm, but the distal aorta was clearly tortuous.   CORONARY ARTERIOGRAPHY:  1. Left main normal, it bifurcated.  2. LAD:  The left anterior descending extended down around the apex of the     heart.  It gave rise to one large first diagonal branch.  It was free of     disease.  The distal vessel had mild irregularities and in the mid     portion of the vessel was an area of 70% narrowing.  3. Circumflex:  The circumflex after the second OM had an area of 50-60%     narrowing.  The marginal vessels themselves were free of significant     disease.  OM-1 was large.  OM-2 was moderate.  OM-3 was very small.     There was mild irregularity, however, in the ongoing circumflex.  4. Right coronary artery:  The right coronary artery had diffuse     irregularities throughout the mid, proximal and distal segments.  The PDA     was free of disease and the first posterior lateral vessel was small.   CONCLUSION:  1. Dilatation of the left ventricle with preserved left ventricular systolic     function.  2. Tortuous aorta, but no abdominal aortic aneurysm.  3. Moderate disease with 50-60% area of narrowing in the circumflex after     the second OM and 70% mid narrowing in the left anterior descending.   At the termination of the procedure I had used 260 mL, the majority of which  were used to cannulate the right coronary artery.  I did not feel that  additional contrast was indicated in this diabetic.  The concern now is  whether or not we aggressively treat the LAD with percutaneous intervention  or whether we manage medically short term.  I will ask  my colleagues to look  at these pictures and render an opinion. The biggest difficulty in this  entire case is the patient's obesity.  Even with good contrast in the  vessels, they were still poorly visualized because of the grainy appearance  secondary to his obesity.   With an unremarkable dobutamine study even though it was not at peak heart  rate, I am less enthusiastic to rush in and fix the LAD without influence  from other people.                                               Jeanella Craze. Little, M.D.    ABL/MEDQ  D:  10/22/2003  T:  10/23/2003  Job:  [id number]

## 2011-01-01 NOTE — Discharge Summary (Signed)
NAME:  [name], [name] NO.:  [id number]   MEDICAL RECORD NO.:  [id number]                   PATIENT TYPE:  INP   LOCATION:  3702                                 FACILITY:  [facility]   PHYSICIAN:  Elmarie Shiley, MD                       DATE OF BIRTH:  12/07/1945   DATE OF ADMISSION:  10/16/2003  DATE OF DISCHARGE:  10/25/2003                                 DISCHARGE SUMMARY   DISCHARGE DIAGNOSES:  1. 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  2. Severe obstructive sleep apnea.  3. Morbid obesity.  4. Hypertension.  5. Type II diabetes mellitus.  6. Hyperlipidemia.  7. Gout.  8. Chronic obstructive pulmonary disease.   DISCHARGE MEDICATIONS:  1. Flovent inhaler 110 mcg 2 puffs q. 12h.  2. Zocor 40 mg 1 p.o. q. h. s.  3. Lasix 40 mg 1 p. o. q. daily.  4. Hydralazine 150 mg p. o. q. 12h.  5. Avandia 4 mg 1 p. o. q. daily.  6. NPH insulin 20 units subcutaneously q. 12h.  7. Glipizide 10 mg 1 p. o. q. daily.  8. Lisinopril 40 mg p. o. q. 12h.  9. Metoprolol 25 mg 1 p. o. q. 12h.  10.      Norvasc 10 mg 1 p. o. q. daily.  11.      Metformin 1000 mg 1 p. o. q. 12h.  12.      Colchicine 0.6 mg p. o. q. daily.  13.      Aspirin 325 mg p. o. q. daily.   DISPOSITION AND FOLLOWUP:  The patient was discharged to his own home, and  he will be following up as follows:  1. At the [facility] at 8:00 p.m. on October 26, 2003, the patient     is scheduled to have a sleep study in order to evaluate his obstructive     sleep apnea and for possible titration of CPAP.  2. Patient is to follow up with Dr.  Chase Picket of [facility] on November 11, 2003.  3. Patient is to follow up at the [facility] on November 15, 2003 at 2:00 p.m.  The pertinent reason for this followup visit would be     to ensure that the patient has good control of his gout symptoms as well     as full resolution of his shortness of  breath that he had experienced     during his hospitalization.  The other thing to ensure during this     followup visit will be to ensure good tolerance to his pacemaker and     absence of pain or infection at site of pacemaker placement.   CONSULTATIONS:  The major consultation that was sought was one with  [facility], Dr.  Delrae Alfred  Little, and Dr.  Nanine Means. Rollene Fare.   PROCEDURES:  The major procedures performed were as follows:  1. On October 16, 2003, spiral CT scan of the chest was done.  This was with     rule out pulmonary embolus protocol.  This showed poor opacification of     the pulmonary arteries.  There may be some filling defects within the     right lower pulmonary artery with an area of associated atelectasis     and/or infiltrate or scarring.  The study was not felt sufficient for a     patient of this kind of body habitus.  CT scan of the lower extremities     done to evaluate for deep venous thrombi was negative.  As an adjunct to     this, also on October 16, 2003, lower extremity Dopplers were done, and     these were negative, with no evidence of DVT, SVT, or Baker's cyst noted.     It was noted that the patient had interstitial fluid in his calf and     ankle area of the left leg.  2. On October 18, 2003, the patient had two-dimensional transthoracic     echocardiogram that was done that showed an overall left ventricular     systolic function which was normal, with a left ventricular ejection     fraction estimated between 55-65%.  It was noted that the left atrium was     mildly dilated, and it was very difficult to obtain acoustic windows.  3. On October 20, 2003, patient underwent a dobutamine Cardiolite test.  4. On October 22, 2003, patient had a cardiac catheterization that showed a     left main coronary artery being normal and bifurcated.  The left anterior     descending artery had mild irregularities, and in the mid portion of the     vessel was an area  of 70% narrowing.  The circumflex artery had an area     of 50-60% narrowing.  The right coronary artery had diffuse     irregularities throughout the mid, proximal and distal segments.  In     conclusion, this study showed a dilatation of the left ventricle with     preserved left ventricular systolic function, a tortuous aorta, but no     aortic abdominal aneurysm.  A moderate disease with 50-60% area of     narrowing in the circumflex after the second OM, and 70% mid narrowing in     the left anterior descending artery.  5. On October 24, 2003, the patient had a BiV pacemaker placement that was     done by Dr.  Rollene Fare.   HISTORY OF PRESENT ILLNESS:  This is a 65 year old African-American male  with past medical history significant for diabetes mellitus, hyperlipidemia,  and hypertension.  He presented with increasing shortness of breath for the  past two weeks prior to presentation that persisted in spite of  nebulizations with albuterol and a cough syrup that he had __________ with  prior to presentation.  On the morning of presentation, the patient noted  that he had been getting slightly confused from the breathlessness and his  shortness of breath increased on lying flat.  The patient had a cough that  was dry for the past two weeks as well as congestion of his nose.  He denied  any fever or chills.  He had no chest pain as such and more of  a generalized  chest discomfort that was not exertional and not associated with any  radiation or diaphoresis.  The patient had no history of any leg/thigh pain,  and no history of prolonged travel.   ALLERGIES:  The patient had no known drug allergies.   PAST MEDICAL HISTORY:  This is significant for chest pain/shortness of  breath in March of 2004 that yielded a negative Cardiolite with an ejection  fraction of 52%.  Past medical history is also significant for macular  degeneration.  SOCIAL HISTORY:  The patient is a current smoker,  smokes one cigarette to  five cigarettes a day for the past several years.   FAMILY HISTORY:  Significant for coronary artery bypass graft in his mother  and his brother.  There is no family history of DVT or personal history of  DVT.   REVIEW OF SYSTEMS:  Patient notes fatigue, occupational exposure to cold,  sore throat, dyspnea, orthopnea, edema, dyspnea on exertion, cough, vomiting  and weakness.   PHYSICAL EXAMINATION:  VITAL SIGNS:  Pulse 65, blood pressure 163/83,  temperature 98.7, respirations of 20 per minute, oxygen saturations of 95%  on room air.  GENERAL:  He is an obese man in no acute distress.  EYES:  He has injected conjunctivae bilaterally.  ENT:  On ENT evaluation, his throat is hyperemic and he has halitosis along  with dry mucosae.  NECK:  This is supple, and he has no goiter palpable.  RESPIRATORY:  He has an increased expiratory phase with right-sided basal  wheezing which is biphasic.  Otherwise, the lung fields are clear to  auscultation.  CARDIOVASCULAR:  His pulse is regular in rate and rhythm.  Heart sounds, S1  and S2 are normal.  GASTROINTESTINAL:  His abdomen is protuberant, nontender, not distended, and  bowel sounds are normal.  EXTREMITIES:  He has bipedal pitting edema at 2+ with the left side being  more profound than the right.  SKIN:  This is dry and excoriated.  NEUROLOGICAL:  On examination of his neurological systems, he is oriented to  time, person and place.  Cranial nerves II-XII are all normal and sensory  motor exam is nonfocal.   ADMISSION LABS:  Sodium of 137, potassium 3.5, chloride 99, bicarbonate of  30, BUN of 15, creatinine of 1.4, and glucose of 152.  His hemoglobin is at  13.9, white cell count of 7.4 with absolute neutrophils of 5.3 (72%).  Platelet count of 155 and MCV of 80.2.  Brain natriuretic peptide level is  at 66.5.  Battery of cardiac enzymes that was screened was unremarkable for  acute myocardial injury.  Chest  x-ray and spiral CT done on admission have  results as listed prior.  D-dimer level was at 0.33.   HOSPITAL COURSE:  1. Shortness of breath/chest discomfort.  The patient was admitted to     telemetry unit.  The main reason for this admission into telemetry was to     monitor him for any possible EKG changes that were suggestive of     myocardial injury.  The pertinent diagnoses that were entertained during     this admission were COPD that was previously undiagnosed versus pulmonary     embolus that was not picked initially on admission.  Also high up on the     differential diagnosis list was obstructive sleep apnea/obesity     hypoventilation syndrome.  We managed to rule out the presence of     pulmonary  embolus by the D-dimers, lower extremity Dopplers and spiral CT     scan of the chest that was done that was unrevealing of pulmonary     embolus.  We also succeeded in ruling out myocardial injury by serial    cardiac enzymes, none of which were elevated except an isolated myoglobin     fraction.  On overnight telemetry on his fourth night of admission, it     was noted that this patient was having severe sinus pauses.  His sinus     pauses ranged from the range of six seconds to almost 13 seconds.  As a     consequence of this, we elicited a cardiology consultation, and the     cardiologists were prompt to respond with further evaluation for any     possible atherosclerotic disease.  The patient underwent a cardiac     catheterization after dobutamine Cardiolite test as prior listed.  After     evaluation of this, he subsequently underwent biventricular pacemaker     placement in order to help with his prognosis of his sinus bradycardia     that he was displaying.  It was noted, however, that during the episodes     of sinus bradycardia, the patient would get severely hypopneic and as     such diagnosis of obstructive sleep apnea was also entertained into his     entire picture of  chest pain and shortness of breath.  2. Obstructive sleep apnea.  This is a diagnosis that was discovered during     the patient's hospitalization stay.  In retrospect, the patient offered     the history that he was experiencing severe daytime somnolence,     especially at his workplace, and would doze off due to having a night of     restless sleep.  We placed this patient on an empiric treatment of CPAP     and it was noted that while he was on the CPAP, he did not develop any     bradycardia spells.  Following the cardiology consultation, however, it     was felt that this patient was at a high risk of having a severe     bradycardia-like syndrome and they went ahead and placed this patient on     a biventricular pacemaker.  The patient is scheduled to have a sleep     study as an outpatient and the results of this will be followed up in     order to know what CPAP levels to place this patient on on a continuous     basis as an outpatient.  3. Mild COPD.  This patient has a longstanding history of moderate to mild     tobacco abuse.  We have started him on enteral corticosteroids as well as     rescue doses of albuterol.  It is felt that while using this, he will     experience less episodes of shortness of breath and his main exacerbating     factor is entirely his obesity that is causing the hypoventilation kind     of syndrome.  4. Diabetes mellitus.  We continued this patient on the same medicines that     he was, but during the early hospitalization phase we withheld his     Metformin due to him having had intravenous dye placement for his spiral     CT scan that was done for pulmonary embolus protocol.  The  patient showed     good glycemic control and further control was supplemented by instituting     a sliding scale insulin.  5. Hypertension.  While bradycardia spells were noted during the     hospitalization stay, we withheld his beta blockade and ACE inhibitor     medications, as we feared that he may have further exacerbation of his     bradycardia.  During the interim phase, we up-titrated his calcium     channel blocker and gave him p.r.n. doses of hydralazine in order to     control his blood pressure.  The necessity of p.r.n. medications was at a     minimum.  After placement of his biventricular pacemaker following     cardiology consultations, we restarted this patient on modest doses of     his beta blocker medications as well as the ACE inhibitors.  He will     continue with this as an outpatient and will have further titration of     the same medications done by Dr.  Rex Kras as an outpatient.  6. Hyperlipidemia.  The patient had already been on Zocor as an outpatient     and fasting lipid panel that was done on this admission showed total     cholesterol of 131, triglycerides of 100, HDL of 30, LDL of 81.  The     patient will be continued on this medication at the same dose as an     outpatient due to significantly good control of his lipid panel.  A TSH     level was also screened during this admission, and this was normal at     0.972.  7. Gout.  During the hospitalization stay, it was quiescent for this     patient, and he did not experience any flare and pain of his gout, and as     such the colchicine was not given on a regular basis.  As an outpatient,     however, the patient will continue on his maintenance dose of 0.6 mg of     colchicine.  8. Gastroesophageal reflux disease.  This is most likely a direct result of     his obesity with the increased intraabdominal pressure from the abdominal     fat pad that the patient has.  The patient was started on Protonix 40 mg     p. o. q. daily and has been advised to continue with lifestyle     modifications, such as eating significantly earlier to sleeping time as     well as elevating the head of his bed in adjunctive treatment to his     Protonix 40 mg p. o. q. daily.                                                 Elmarie Shiley, MD    JP/MEDQ  D:  11/25/2003  T:  11/25/2003  Job:  [id number]

## 2011-01-01 NOTE — Discharge Summary (Signed)
NAME:  [name], CONDRY NO.:  [id number]   MEDICAL RECORD NO.:  [id number]          PATIENT TYPE:  INP   LOCATION:  [id number]                         FACILITY:  [facility]   PHYSICIAN:  Carolin Guernsey, MD         DATE OF BIRTH:  05-27-1946   DATE OF ADMISSION:  DATE OF DISCHARGE:  10/14/2004                                 DISCHARGE SUMMARY   CHIEF COMPLAINT:  Shortness of breath.   DISCHARGE DIAGNOSES:  1.  Pneumonia.  2.  Nonischemic cardiomyopathy.  3.  Hypertension.  4.  Diabetes mellitus type 2.  5.  Hyperlipidemia.  6.  Gout.  7.  Benign prostatic hypertrophy.  8.  Obstructive sleep apnea.  9.  Morbid obesity with obesity hypoventilation syndrome.  10. Gastroesophageal reflux disease.   DISCHARGE MEDICATIONS:  1.  Albuterol MDI, two puffs, q.4h. p.r.n.  2.  Advair Diskus inhaled b.i.d.  3.  Avelox 400 mg p.o. daily x5 more days.  4.  Colchicine 0.6 mg p.o. daily.  5.  Atenolol 50 mg p.o. daily.  6.  Hydralazine 150 mg p.o. b.i.d.  7.  Imdur 60 mg p.o. daily.  8.  Lasix 60 mg p.o. daily.  9.  Zocor 40 mg p.o. daily.  10. Glucophage 500 mg p.o. b.i.d.  11. Glucotrol 10 mg p.o. daily.  12. Lisinopril 40 mg p.o. daily.  13. NPH insulin 10 units subcu daily.  14. Protonix 40 mg p.o. daily.   FOLLOWUP:  The patient is supposed to follow up with [facility]  outpatient clinic internal medicine on March 10 at 2 p.m., and also he is  supposed to follow up with Dr. Tarri Fuller D. Young on his scheduled  appointment.  On followup with internal medicine, he will have an x-ray,  hemoglobin A1c and a CMET checked.   HISTORY OF PRESENT ILLNESS:  This is a 65 year old man with past medical  history significant for nonischemic cardiomyopathy with ejection fraction of  [id number] with diastolic dysfunction and status post DDDR pacemaker in 2005,  hypertension, obesity, hypoventilation syndrome, obstructive sleep apnea,  diabetes mellitus type 2.  He presents to the [facility] ER with  two days' history of cough, shortness of breath, and sputum production,  yellow, no hemoptysis.  The patient denies fever, but he states that he has  been having chills. He has felt fatigued due to having shortness of breath.  He denies any chest pain, chest pressure, any palpitations.  He admits using  CPAP only p.r.n.   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   ADMISSION LABORATORY DATA:  Sodium 141, potassium 3.7, chloride 105,  bicarbonate 26, BUN 10, creatinine 1.2, glucose 241.  White blood cells 8.5,  hemoglobin 15.1 and platelets 140.  BNP 44, D-dimer 0.34, cardiac enzymes  negative.   PHYSICAL EXAMINATION:  VITAL SIGNS:  Heart rate 74, blood pressure  __________/97, temperature 97.1, respiratory rate 20.  O2 saturation 94% on  2 liters.  GENERAL:  He is obese, sitting up on the side of the bed, in no acute  distress.  HEENT:  Eyes:  Pupils are PERRLA, extraocular movements intact.  ENT:  Hyperemia and no exudate on oropharynx mucosa exam.  NECK:  Supple.  RESPIRATORY:  Distant breath sounds bilaterally, diffuse wheezing, no rales,  and scattered rhonchi.  CARDIOVASCULAR:  Regular rate and rhythm.  GI:  Bowel sounds are positive, soft, obese, nontender, nondistended.  EXTREMITIES:  Edema, 1-2+ bilaterally.  No lymphadenopathy.  MUSCULOSKELETAL:  Intact.  NEUROLOGIC:  Oriented x3.   PROBLEMS:  Problem 1:  Shortness of breath, cough productive of yellow  sputum. We checked a chest x-ray which showed possible left lower lobe  infiltrate.  We check urine, Legionella and pneumococcus antigen which were  both negative and because the patient also complained of some runny nose,  and nose stuffiness, we checked influenza antigen  which was negative.  We  started him on nebulizers, albuterol and Atrovent, and the patient improved  during hospitalization.  He also received antibiotics. He was started on  Avelox IV, which was changed prior to discharge on Avelox oral.  He is  supposed to take that for five more days.   Problem 2:  Sore throat.  The patient complained of sore throat.  On exam,  he had an erythematous oropharynx mucosa.  We checked a rapid strep test  which was negative.   Problem 3:  Cardiomyopathy. The patient continues on home medication.  He  will be discharged on similar doses.  BNP was within normal limits.   Problem 4:  Hypertension.  Relatively under control.  We adjusted some of  the medication; please see the medication list.   Problem 5:  Diabetes mellitus.  The patient was placed on an SSI, NovoLog,  for better control during hospitalization.  He was discharged home on  previous doses of Glucotrol XL, Glucophage and NPH insulin.  He will follow  up as an outpatient with his primary care doctor.   Problem 6:  Dyslipidemia.  The patient is on Zocor.  He will continue that  as an outpatient.   Problem 7:  Gout.  This was stable during hospitalization.  He will follow  up as an outpatient.   Problem 8:  Metastatic hypertrophy.  The patient had no symptoms during  hospitalization.   Problem 9:  Prophylaxis.  He was placed on Lovenox during hospitalization.   DISCHARGE LABS:  Sodium 138, potassium 3.5, chloride 100, bicarbonate 28,  BUN 10, creatinine 1.2, hemoglobin 14.1.  White blood cells 8.5, platelets  129,000.      DA/MEDQ  D:  10/16/2004  T:  10/17/2004  Job:  [id number]   cc:   Tarri Fuller D. Annamaria Boots, M.D.   Outpatient Clinic, [facility]

## 2011-01-01 NOTE — Discharge Summary (Signed)
NAME:  [name], [name] NO.:  [id number]   MEDICAL RECORD NO.:  [id number]                   PATIENT TYPE:  OIB   LOCATION:  16                                 FACILITY:  [facility]   PHYSICIAN:  Richard A. Rollene Fare, M.D.          DATE OF BIRTH:  02-May-1946   DATE OF ADMISSION:  01/09/2004  DATE OF DISCHARGE:  01/11/2004                                 DISCHARGE SUMMARY   DISCHARGE DIAGNOSES:  1. Pacemaker right ventricular lead malfunction.     a. Right ventricular lead repositioned.  2. Symptomatic bradycardia with DDDR pacer implantation October 24, 2003 with     pauses of greater than 7 seconds.  3. Insulin-dependent diabetes mellitus.  4. Sleep apnea with continuous positive airway pressure titration.  5. Hypertension.  6. Hyperlipidemia.  7. Gout.  8. History of coronary disease noncritical with catheterization October 22, 2003 with normal left ventricular function.  9. Hyperlipidemia.   CONDITION ON DISCHARGE:  Improved.   PROCEDURES:  Pacemaker lead repositioned Jan 09, 2004 by Dr. Terance Ice.   DISCHARGE MEDICATIONS:  Please note:  There was discrepancy between home  medicines and our office record medicine list. Unsure if this was related to  the patient not bringing in medications or if our list is more correct.  1. Imdur 30 mg daily.  2. Furosemide 40 mg daily.  3. Flovent 110 mcg two puffs twice a day.  4. Insulin NPH 20 units twice a day.  5. Glucotrol 10 mg daily.  6. Lisinopril. We had patient was on it b.i.d., bottle states once a day, he     will continue taking it as at home.  7. Lopressor 25 mg twice a day.  8. Stop Norvasc.  9. Glucophage 1000 mg twice a day, restart on Sunday.  10.      __________ 40 mg daily.  11.      Zocor 40 mg daily.  12.      Hydralazine 150 mg twice a day.  13.      Colchicine as needed.  14.      Avandia as before if he was taking. We did not have this in our     records.  15.       Aspirin as before.  16.      CPAP titration per home health and Dr. Annamaria Boots.   DISCHARGE INSTRUCTIONS:  1. Do not raise right arm above shoulder for 5 days.  2. Low-fat, low-salt,     diabetic diet.  3. Do not get pacer site wet for 3 days. He may shower     and pat dry.  4. Call if any swelling at pacemaker site or drainage.  5.     Our office will call you for pacer site check appointment.  6. Please     call Dr. Janee Morn office for follow-up appointment with sleep apnea in 2  weeks.  7. Please bring all medicines to appointments with Dr. Rollene Fare.   HISTORY OF PRESENT ILLNESS:  A 65 year old divorced father of four with one  grandchild works with [employer], does a forklift and  heavy lifting. He has a history of noncritical coronary disease but did have  profound sinus pauses of 6 to 7 seconds with symptomatic bradycardia  undergoing permanent pacemaker DDDR Medtronic Insure plus generator model  [id number] with bipolar silicone steroid-eluting passive fixation. At implant,  he had excellent ventricular threshold and atrial threshold and then  patient, unfortunately, started doing heavy lifting prior to his follow-up.  His follow-up ventricular thresholds have remained extremely high. They  tried steroids and that did not help. On x-ray, the lead looks in good  placement, but with his elevated thresholds, it was felt patient needed to  reposition the electrode. He was brought in on Jan 09, 2004 for that purpose  secondary to microdislodgement versus exit block with normal parameters at  pacemaker implant.   Please note, the patient also had a sleep study and has sleep apnea and  needed CPAP titration which will done during this hospitalization as well.   PAST MEDICAL HISTORY:  1. Coronary disease as described noncritical.  2. Obesity.  3. Insulin-dependent diabetes mellitus.  4. Hypertension.  5. Hyperlipidemia.  6. Gout.   MEDICATIONS:  Please note:   Medications from our office visit were somewhat  different than what patient stated he had been taking and will need to have  him bring in all of his bottles as an outpatient from now on for closer  management of the patient.   Family history, social history, and review of systems, see Dr. Lowella Fairy  note.   DISCHARGE PHYSICAL EXAMINATION:  Blood pressure 156/84, pulse 70,  respirations 22, temperature 98.7, room air oxygen saturation 96%. On exam,  the lungs are clear. The edema of lower extremities resolved. Heart sounds:  Regular rate and rhythm. He was afebrile. Pacer site was without hematoma.   LABORATORY VALUES:  Hemoglobin 14, hematocrit 43, WBC 8.5, platelets 167.  Sodium 136, potassium 3.6, chloride 99, CO2 30, glucose 243, BUN 12,  creatinine 1.3. Calcium 9.0.   Cholesterol:  Total cholesterol 163, triglycerides 116, HDL 34, LDL 106.   HOSPITAL COURSE:  [name] was admitted Jan 09, 2004 for possible  microdislodgement with increased ventricular threshold of the RV lift and  for repositioning which was completed by Dr. Rollene Fare. The patient was  stable.   Dr. Annamaria Boots also saw the patient in consultation for sleep apnea, CPAP was  started, and will be titrated with home health care as an outpatient.   By Jan 11, 2004, the patient was stable. Once chest x-ray results are back,  the patient will be discharged home. He will follow up with Dr. Rollene Fare as  instructed.      Otilio Carpen. Ingold, N.P.                     Richard A. Rollene Fare, M.D.    LRI/MEDQ  D:  01/11/2004  T:  01/12/2004  Job:  [id number]   cc:   Tarri Fuller D. Young, M.D.  [place]. [place] [address]  Fax: [place]

## 2011-01-01 NOTE — Telephone Encounter (Signed)
I think your plan is OK.

## 2011-01-01 NOTE — Discharge Summary (Signed)
NAME:  [name], Ryan NO.:  [id number]   MEDICAL RECORD NO.:  [id number]                   PATIENT TYPE:  INP   LOCATION:  [id number]                                 FACILITY:  [facility]   PHYSICIAN:  [name], M.D.                   DATE OF BIRTH:  1946-05-21   DATE OF ADMISSION:  10/25/2002  DATE OF DISCHARGE:  10/28/2002                                 DISCHARGE SUMMARY   DISCHARGE DIAGNOSES:  1. Chest pain, rule out myocardial infarction.  2. Hypertension.  3. Dyspnea secondary to congestion, viral upper respiratory infection.  4. Diabetes.  5. Hyperlipidemia.  6. Obesity.  7. History of gout.   DISCHARGE MEDICATIONS:  1. Lisinopril 10 mg one p.o. daily.  2. Zocor 20 mg one p.o. daily.  3. Hydrochlorothiazide 25 mg one p.o. daily.  4. Aspirin 325 mg one p.o. daily.  5. Hydralazine 150 mg one p.o. b.i.d.  6. Glipizide 10 mg one p.o. daily.  7. Avandia 4 mg one p.o. daily.  8. Atenolol 50 mg one p.o. b.i.d.   FOLLOW UP:  The patient is to follow up with Dr. Redmond Baseman and he has made an  appointment with him at the outpatient clinic.   PROCEDURE:  Adenosine Cardiolite was performed and showed negative adenosine  infusion.  Cardiolite images showed no evidence of any ischemia, EF about  52.   CONSULTATIONS:  Cardiology, Dr. Acie Fredrickson saw the patient.  Put in a stent.   HISTORY OF PRESENT ILLNESS:  The patient is a 65 year old African-American  male with a past medical history of obesity, diabetes 2, hypertension,  hyperlipidemia who presented with acute onset of cough, congestion the last  one and a half weeks.  He notes he was coughing with chest congestion for  one and a half weeks.  The patient denied headache, denied any sore throat,  ear ache, fever or chills.  He also complained of some chest tightness,  heavy sensation of the chest that could be related to shortness of breath or  congestion.  Dyspnea on  exertion, especially when he walks  about two  blocks.  He denied nausea, vomiting, or diaphoresis.  Denied radiation of  pain.   ALLERGIES:  No known drug allergies.   PAST MEDICAL HISTORY:  As noted in problem list.   HABITS:  The patient is not a smoke.  He  drinks alcohol socially.   SOCIAL HISTORY:  The patient is divorced.  He usually works for a [employer].  He is self-pay and works alone.  He has four children.   FAMILY HISTORY:  Mother died at 53 of MI, diabetes, hypertension, congestive  heart failure.  She was status post CABG. Father died at 69 of  cerebrovascular disease, hypertension and diabetes.  Siblings:  One sister  died secondary to CVA.  Children:  Four are healthy.  Brother had CABG.  PHYSICAL EXAMINATION:  GENERAL APPEARANCE:  He was alert and oriented x3.  VITAL SIGNS:  Pulse 75, blood pressure 180/91, temperature 98, respiratory  rate 20, O2 saturation 97% on room air.  HEENT:  Eyes:  PERRLA, extraocular movements intact.  ENT:  Oropharynx has  moderate __________ .  NECK:  Supple with no lymphadenopathy.  LUNGS:  Clear to auscultation __________ .  CARDIOVASCULAR:  Regular rate and rhythm, no murmurs, rubs, or gallops.  ABDOMEN:  Soft, obese, nontender and nondistended.  Bowel sounds heard.  EXTREMITIES:  No clubbing, cyanosis, or edema.  GU:  Deferred.  SKIN:  No rash, no lymphadenopathy.  NEUROLOGIC:  Intact.  __________  appropriate.   LABORATORY DATA:  Sodium 137, potassium 3.6, chloride 101, bicarb 39, BUN  16, creatinine 1.0, glucose 126.  Hemoglobin 20.6, white blood cell count  11.5, platelets 157, MCV 80.3, __________  7.4.  Bilirubin 0.8, alkaline  phosphatase 60, SGOT 18, SGPT 22, protein 7.1, albumin 2.5, calcium 9.0.  PT  130, INR 1.0, PTT 29.  CK __________ 125, 2.3 MB, index 1.5 and troponin I  0.02.  Second set was 196 CK, MB 2.7, index 1.4, troponin I 0.01.   EKG with normal sinus rhythm with a negative __________ .  T-wave inversion  in V5 and V6 and no ST  elevation or depression.  Chest x-ray showed  pulmonary hypertension.   HOSPITAL COURSE:  1. COUGH:  Viral upper respiratory infection.  The patient was begun on     Tussionex __________  for symptomatic relief.  He was not febrile.  2. ORTHOPNEA:  May may be related to #1, otherwise due to an MI, so we had     to rule out an MI.  We got cardiac enzymes __________  and EKG.  Started     him on aspirin, also a 2-D echo.  Consulted cardiology for the chest     pain.  They recommended adenosine Cardiolite which he got.  The results     of this showed no evidence of any ischemia, EF about 52%.  3. HYPERTENSION:  The patient was on multiple medications and blood pressure     remained slightly elevated during his stay.  On the day of discharge he     was sent home on Lisinopril 10 mg one p.o. daily, hydrochlorothiazide 25     mg one p.o. daily, Hydralazine 150 mg one p.o. b.i.d., and Atenolol 50 mg     one p.o. b.i.d.  This will be followed as an outpatient.  4. DIABETES:  This was pretty much under control when he was in the hospital     and he was sent out on Avandia 4 mg one p.o. daily, Glipizide 10 mg one     daily.  5. INCREASED LIPID:  Continue with Lipitor in the hospital.    DISCHARGE LABORATORY DATA:  White count 10.8, hemoglobin 15, platelets 157.  Total cholesterol 156, triglycerides 100, HDL cholesterol 42, total  cholesterol __________  4.0, the LDL cholesterol 20 and __________  cholesterol 104.                                               [name], M.D.    TS/MEDQ  D:  11/14/2002  T:  11/15/2002  Job:  [id number]   cc:   Berkley Harvey, D.O.  [facility]  [place], [place] [address]  Fax: [phone]   Thayer Headings, M.D.  [id number], [address] [place]  [place] [address]  Fax: [phone]

## 2011-01-01 NOTE — Op Note (Signed)
NAME:  [name], [name] NO.:  [id number]   MEDICAL RECORD NO.:  [id number]                   PATIENT TYPE:  OIB   LOCATION:  12                                 FACILITY:  [facility]   PHYSICIAN:  Richard A. Rollene Fare, M.D.          DATE OF BIRTH:  1946-04-07   DATE OF PROCEDURE:  01/09/2004  DATE OF DISCHARGE:                                 OPERATIVE REPORT   PROCEDURE:  Ventricular lead revision with probable microdislodgement, right  ventricular lead, with unacceptable elevated ventricular thresholds,  chronic, after close outpatient follow-up (date of implantation October 24, 2003).   PREOPERATIVE DIAGNOSES:  1. Severe medically-complicated morbid obesity.  2. Resistant systemic hypertension.  3. Hyperlipidemia.  4. Coronary artery disease with noncritical 50-60% midcircumflex narrowing,     60-70% mid-left anterior descending artery narrowing, normal left     ventricular at catheterization October 22, 2003.  Tortuous aorta without     aneurysm.  5. Status post DDDR pacemaker October 24, 2003, for symptomatic bradycardia     with documented sinus pauses greater than seven seconds.  6. Sleep apnea.  7. Insulin-dependent diabetes.  8. History of gout, hyperuricemia under therapy.  9. Chronic venous insufficiency with chronic edema.   POSTOPERATIVE DIAGNOSES:  1. Severe medically-complicated morbid obesity.  2. Resistant systemic hypertension.  3. Hyperlipidemia.  4. Coronary artery disease with noncritical 50-60% midcircumflex narrowing,     60-70% mid-left anterior descending artery narrowing, normal left     ventricular at catheterization October 22, 2003.  Tortuous aorta without     aneurysm.  5. Status post DDDR pacemaker October 24, 2003, for symptomatic bradycardia     with documented sinus pauses greater than seven seconds.  6. Sleep apnea.  7. Insulin-dependent diabetes.  8. History of gout, hyperuricemia under therapy.  9. Chronic venous  insufficiency with chronic edema.   COMPLICATIONS:  None.   ESTIMATED BLOOD LOSS:  Approximately 50 mL.   ANESTHESIA:  5 mg Valium p.o. premedication, 1 g Ancef preop antibiotic  prophylaxis, 8 mm Nubain in divided doses IV, 75 mcg of fentanyl divided  doses during procedure.   BRIEF HISTORY:  This patient had DDDR pacemaker implant October 24, 2003.  He  has severe exogenous obesity, is a large man.  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  He was also doing heavy lifting soon after discharge  from the hospital and prior to his first follow-up visit.  He had excellent  bipolar, unipolar atrial and ventricular thresholds a implant with stable  lead positions that were passive-fixation electrodes.   The atrial electrode Medtronic 5594-53 cm, serial number [id number].  Ventricular electrode:  Medtronic 5092-58 cm, serial number [id number].  Pulse generator:  Medtronic Enpulse number [id number], serial number [id number].  Ventricular thresholds were 0.4 V bipolar, 0.3 volts unipolar, with R-waves  measuring 16 and 17  mV.   When he first appeared at the office he required some local wound care, but  he was found to have extremely high thresholds on his ventricular electrode  despite normal impedance.  There was no gross lead dislodgement on x-ray,  and it was felt that this was either early exit block or early threshold  rise or microdislodgement.  He has been followed by the last several months  and has maintained a high ventricular threshold despite a short course of  steroids as an outpatient.  His wound healed well, but it was felt best to  bring the patient back in now for lead revision in this setting with  unacceptable high ventricular threshold of 4-4.5 V and excellent atrial  thresholds of less than 0.5 V as an outpatient.  Informed consent was  obtained and the patient accepted the risks and  benefits of the procedure  and agreed.  He was supposed to hold his aspirin for several days prior to  the procedure, but he did not.  Insulin was held on day of the procedure.  He was admitted as a same-day admission with normal preoperative laboratory.   He was brought to the second floor CP lab in a postabsorptive state.  He was  given 5 mg of Valium p.o. premedication, 1% Xylocaine was used for local  anesthesia, and he was sedated during the procedure as outlined above.  Because of his severe exogenous obesity, the exposure was technically  difficult and challenging, but we were able to accomplish it.  He did have  considerable oozing throughout the procedure, which was controlled with  electrocautery, and near the end of the procedure Avitene was used sparingly  to help control hemostasis.   A right infraclavicular curvilinear incision was performed below the  previously-placed incision, which had a mild keloid formation but was well-  healed.  We did cut through some fibrous subdermal and subcutaneous tissue  to reach the pulse generator pocket.  The fibrous pocket had not had time to  form.  There was a small amount of mild serous, non-smelling clear fluid  that came out from the pocket and no significant hematoma.  There was no  evidence on examination of infection.  The generator was freed up using  blunt dissection.  Prior to starting the procedure we put in a temporary  transvenous pacer through the CRFV through a six-inch venous sheath.  We  also put in a 4 French sheath into the CFA to monitor arterial pressures  throughout the procedure. His arterial pressures measured 180-200 pretty  much throughout the procedure, and he was given a total of 20 mg of  labetalol for blood pressure control IV.  Temporary pacing was positioned in  the RV apex with excellent threshold and kept on standby.  The generator was delivered from the pocket in the bipolar mode.  The  electrodes were  disconnected, temporary pacing was at back-up, and the  patient was in sinus bradycardia with intact AV conduction.  A very careful  dissection was done down to the silicone sewing collars that were each  secured with two #1 silk sutures.  The generator was secured with a #1 silk  suture that was cut and the suture removed in total.  The two sutures for  each lead were likewise removed carefully so that the silicone sewing  collars were free and pulled back.  Fluoroscopy showed the position of the  RV electrode to be below the tricuspid  valve, probably in the midportion of  the ventricle and below the temporary transvenous pacer.   The atrial electrode had a somewhat enlarged loop.  A soft stylet was  positioned and this was slightly pulled back without difficulty to give a  less redundant loop, but it was well-secured to the right atrial appendage.   A moderate stiff stylet was then passed through the ventricular electrode  and using very careful traction, the electrode easily was removed from the  RV.  Inspection of both electrodes and pin connectors showed them to be in  good condition.  The RV electrode was then repositioned near the RV apex  just below the temporary transvenous pacer.  There was an adequate loop  across the tricuspid valve and the electrode remained stable throughout the  procedure.  Unipolar and bipolar atrial and ventricular threshold testing  was performed.  The electrodes were secured with the silicone sewing collar  and two separate interrupted #1 silk sutures around each electrode to  prevent migration.  Electrocautery was used to control hemostasis.  The  pocket was irrigated with 500 mg of kanamycin solution.  The generator was  hooked to the electrodes in the proper AV sequence with the single hex nut  tightened and delivered into the pocket with the electrodes looped behind.  It was loosely secured to the underlying muscle and fascia with a #1 silk  suture  to prevent migration.  Subcutaneous tissue was closed with two  separate running layers of 2-0 Vicryl suture.  The skin was closed with 6-0  subcuticular Vicryl suture, and Steri-Strips were applied.  The pocket was  dry at the end of the procedure.  The suture line was well-apposed.  Fluoroscopy showed good position of the atrial and ventricular electrodes  and good connection.  The patient was transferred to the holding area for  postoperative threshold testing, reprogramming, and care.  He tolerated the  procedure well.   THRESHOLDS:  Unipolar atrial:  Impedance 440 Ohms, threshold 0.4 V at 0.5  msec, P-wave 3.4 mV.  Unipolar ventricular:  Impedance 462 Ohms, threshold 1 V at 0.5 msec, R-wave  18.2 mV.  Bipolar atrial:  Impedance 538 Ohms, threshold 0.5 V, R-wave 4.9 mV.  Bipolar R-wave:  Impedance 561 Ohms, threshold 0.7 V at 0.5 msec, R-wave 22  mV.  Slew rate greater than 4 volts per second. Slew rate unipolar ventricular greater than 3.5 volts per second.   Magnet rate at BOL equals 85, at RRT it drops to 65 and reverts to VVI mode.   The patient will be evaluated by pulmonary for sleep apnea with his severe  obesity and recent positive outpatient sleep study October 26, 2003, at [facility].                                               Richard A. Rollene Fare, M.D.    RAW/MEDQ  D:  01/09/2004  T:  01/10/2004  Job:  [id number]   cc:   [facility]   Second Floor CP Lab   Clinton D. Young, M.D.  [place]. [place]  [place] [address]  Fax: [phone]

## 2011-01-01 NOTE — Consult Note (Signed)
NAME:  [name], [name] NO.:  [id number]   MEDICAL RECORD NO.:  [id number]          PATIENT TYPE:  INP   LOCATION:  0151                         FACILITY:  [facility]   PHYSICIAN:  Edythe Lynn, M.D.       DATE OF BIRTH:  Feb 25, 1946   DATE OF CONSULTATION:  07/26/2005  DATE OF DISCHARGE:                                   CONSULTATION   PRIMARY CARE PHYSICIAN:  Dr. Marcello Moores with [facility].   REFERRING PHYSICIAN:  Dr. Autumn Messing.   REASON FOR CONSULTATION:  Medical management.   HISTORY OF PRESENT ILLNESS:  [name] is a 65 year old African-American  man with obesity and umbilical hernia, who presented today to his  cardiologist with complaints of abdominal pain.  He denies any nausea and  vomiting but reports having difficulty passing stools.  He was sent  emergently to a consultation with Dr. Hulen Skains, who referred the patient for an  urgent hernia repair at [facility] by Dr. Marlou Starks.  Dr. Marlou Starks  performed the hernia repair tonight and called [place] for help with medical  management, given the patient's multiple medical problems.   PAST MEDICAL HISTORY:  1.  Obesity.  2.  Obstructive sleep apnea.  3.  Obesity hypoventilation syndrome.  4.  Hypertension.  5.  Diabetes mellitus, type 2.  6.  Gout.  7.  Insertion of a dual-chamber pacemaker.  8.  Gastroesophageal reflux disease.  9.  Two-vessel nonobstructive coronary artery disease with 50% stenosis of      the circumflex and 60% stenosis of the LAD with the last cardiac      catheterization done in March, 2005.   HOME MEDICATIONS:  1.  Colchicine 0.6 daily.  2.  Atenolol 100 daily.  3.  Hydralazine 150 twice daily.  4.  Imdur 30 daily.  5.  Prilosec 20 daily.  6.  Advair 1 puff twice daily.  7.  Metformin 1000 mg twice daily.  8.  Glipizide 10 mg daily.  9.  Insulin NPH twice daily.  10. Lasix 80 mg daily.  11. Lisinopril 40 mg daily.  12. Teveten 400 mg daily.   ALLERGIES:  The  patient has no known drug allergies.   SOCIAL HISTORY:  [name] does not smoke cigarettes, drinks alcohol only  occasionally and very rarely, works in [employer] business.   FAMILY HISTORY:  Positive for diabetes.   REVIEW OF SYSTEMS:  Negative for chest pain, negative for shortness of  breath, negative for cough, negative for fever, negative for dysuria.  All  other systems as per HPI.  Other review of systems are negative.   PHYSICAL EXAMINATION UPON ADMISSION:  VITAL SIGNS:  Temperature 97.7, blood  pressure 130/50, heart rate 70, respirations about 12, saturation 90% of 4 L  oxygen per nasal cannula.  GENERAL:  [name] appears obese, in no acute distress, lying in bed.  He is oriented to place, person, and time.  HEENT:  His eyes have pupils equal, round, and reactive to light and  accommodation.  Extraocular movements intact.  His sclerae is anicteric.  His conjunctivae are pink.  NECK:  Thick and short, without JVD and no carotid bruits.  LUNGS:  Some rhonchi in both lung fields.  No wheezes, no crackles, and very  poor air movement.  HEART:  Regular, distant, without murmurs, rubs, or gallops.  ABDOMEN:  Obese, distended, quiet, nontender.  LOWER EXTREMITIES:  +1 edema.  SKIN:  Warm and dry.  LYMPHATICS:  There is no palpable lymphadenopathy.  NEUROLOGIC:  Significant for the patient being able to move all 4  extremities.   LABORATORY DATA:  Sodium 140, potassium 3.8, chloride 100, bicarb 71, BUN  10, creatinine 1.1, glucose 152.  White blood cell count 11.9, hemoglobin  15.4, and platelet count 148.  His urinalysis is positive for leukocyte  esterase.   IMPRESSION/RECOMMENDATIONS:  1.  A 65 year old man, status post incarcerated hernia repair with some mild      postop ileus:  Management per the primary team.  2.  Obesity hypoventilation syndrome/sleep apnea: Would recommend aggressive      postop pulmonary toilet with nebulizer and incentive spirometry  and      continued home settings with the CPAP.  3.  Hypertension:  We have ordered intravenous metoprolol and hydralazine      while the patient is NPO and would resume the patient's atenolol,      hydralazine, Lisinopril, and Lasix when the patient is able to tolerate      the diet and the postoperative fluid balance is clear.  4.  Diabetes mellitus:  While the patient is NPO, would use a sliding-scale      NovoLog every 4 hours and would resume the patient's NPH insulin and      glipizide when he is able to eat.  5.  Gout:  Does not seem to be active right now.  Will hold the patient's      colchicine.  6.  Benign prostatic hyperplasia:  We will keep a Foley catheter in for now      while on morphine p.r.n. and then will try voiding trial when the      patient is off narcotics.  7.  History of coronary artery disease:  Patient does not have any chest      pain right now.  His vital signs seem stable.  We will follow closely      clinically.  8.  Deep venous thrombosis prophylaxis:  This patient needs Lovenox as soon      as surgical team deems it safe.  For      now, he is on PAS hoses.  9.  Gastrointestinal prophylaxis:  We will use Protonix intravenously.   Thank you for this consultation.  Will follow along with you.      Edythe Lynn, M.D.  Electronically Signed     SL/MEDQ  D:  07/26/2005  T:  07/27/2005  Job:  [id number]   cc:   Sammuel Hines. Daiva Nakayama, M.D.  [id number]  [place]  [place] [address]   Judie Bonus, MD  Fax: [phone]

## 2011-01-01 NOTE — Op Note (Signed)
NAME:  [name], UNDERKOFFLER NO.:  [id number]   MEDICAL RECORD NO.:  [id number]          PATIENT TYPE:  INP   LOCATION:  0151                         FACILITY:  [facility]   PHYSICIAN:  Sammuel Hines. Daiva Nakayama, M.D. DATE OF BIRTH:  [birth year]   DATE OF PROCEDURE:  07/26/2005  DATE OF DISCHARGE:                                 OPERATIVE REPORT   PREOPERATIVE DIAGNOSIS:  Incarcerated ventral hernia.   POSTOPERATIVE DIAGNOSIS:  Incarcerated ventral hernia with strangulated  omentum.   PROCEDURES:  Exploratory laparotomy, lysis of adhesions and repair of  incarcerated ventral hernia with partial omentectomy.   SURGEON:  Sammuel Hines. Marlou Starks, MD.   ASSISTANT:  Georgina Quint, MD.   ANESTHESIA:  General endotracheal.   DESCRIPTION OF PROCEDURE:  After informed consent was obtained, the patient  was brought to the operating room and placed in a supine position on the  operating room table. After adequate induction of general anesthesia, the  patient's abdomen was prepped with Betadine and draped in the usual sterile  manner. A midline incision was made with a 10 blade knife. This incision was  carried down through the skin and into the subcutaneous tissue sharply with  electrocautery. The hernia sac was readily identified and the hernia itself  was separated from the rest of the subcutaneous tissue by a combination of  blunt finger dissection and sharp dissection with the electrocautery. Once  this was accomplished, the hernia sac was opened at the fascial level and  the fascia opening was extended a little bit superiorly and inferiorly in  order to be able to reduce the hernia. The contents of the sac appeared only  to be omentum and there was one area of strangulated omentum that was  infarcted. This area of omentum was resected by clamping beneath it the with  Kelly clamps, dividing the omentum and ligating it with 2-0 silk ties. The  rest of the omentum in the hernia sac was separated  from the wall of the  hernia sac sharply with the electrocautery and was able to be then easily  reduced. The hernia sac itself was excised sharply with the electrocautery  and sent to pathology along with the omental specimen for further  evaluation. Once this was accomplished, the area appeared to be hemostatic.  The subcutaneous tissue along the top of the fascia was undermined sharply  with electrocautery to take some of the tension off of the fascia. Because  there was some cellulitis of the skin, we chose not to reinforce the closure  with any kind of mesh. The fascia of the abdominal wall was then closed with  two running #1 Novofil stitches. The subcutaneous tissue was irrigated with  copious amounts of saline. The subcutaneous tissue was then  closed with a running 2-0 Vicryl stitch and the skin was closed with  staples. Sterile dressings were applied. The patient tolerated the procedure  well. At end of the case, all needle, sponge and instrument counts were  correct. The patient was then awakened and taken to recovery in stable  condition.      Sammuel Hines.  Daiva Nakayama, M.D.  Electronically Signed     PST/MEDQ  D:  07/27/2005  T:  07/27/2005  Job:  [id number]

## 2011-01-01 NOTE — Discharge Summary (Signed)
NAME:  [name], [name] NO.:  [id number]   MEDICAL RECORD NO.:  [id number]          PATIENT TYPE:  INP   LOCATION:  [id number]                         FACILITY:  [facility]   PHYSICIAN:  Michel Bickers, M.D.    DATE OF BIRTH:  [birth year]   DATE OF ADMISSION:  08/21/2008  DATE OF DISCHARGE:  08/24/2008                               DISCHARGE SUMMARY   DISCHARGE DIAGNOSES:  1. Right leg ulcer, most likely of venous origin.  2. Shortness of breath, likely multifactorial, related to congestive      heart failure, obstructive sleep apnea, obesity hypoventilation.  3. Diabetes mellitus  4. Hyperlipidemia  5. Hypertension.  6. Morbid obesity.  7. Hypertensive cardiomyopathy, diastolic dysfunction by      echocardiography in 2006 with ejection fraction of 55-65%.  8. Two-vessel nonobstructive coronary artery disease, catheterized in      March 2005 with 60% left anterior descending and 30% circumflex      lesions.  9. 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  10.Macular degeneration.  11.Umbilical hernia, history of operation in December 2006.  12.History of heme-positive stool.  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  14.Gout.  15.Benign prostatic hypertrophy.   DISCHARGE MEDICATIONS:  1. Amlodipine 10 mg once daily.  2. Aspirin 81 mg once daily.  3. Carvedilol 25 mg twice a day.  4. Clonidine 0.2 mg twice a day.  5. Colchicine 0.6 mg once daily.  6. Cymbalta 60 mg once daily.  7. Lasix 40 mg twice a day.  8. Glipizide 10 mg twice a day.  9. Hydrochlorothiazide 25 mg once daily.  10.Insulin, Lantus 45 units subcutaneous once daily.  11.Metformin 1000 mg b.i.d.  12.Zocor 40 mg once daily.   DISPOSITION AND FOLLOWUP:  The patient is to follow with [facility].  At the followup visit, it is recommended  that the patient be reviewed for his leg ulcer, as well as his chronic  medical problems, mainly his diabetes, hypertension, and  heart failure.   STUDIES/PROCEDURES DONE DURING THIS ADMISSION:  X-ray of tibia and  fibula on the right side on August 21, 2008, no acute skeletal  abnormalities.  No radiographic evidence for osteomyelitis.   CONSULTATIONS:  No special consults were requested during this hospital  admission.   BRIEF ADMISSION HISTORY AND PHYSICAL:  Mr. Coday is a 65 year old man  with multiple medical issues who was in his usual state of health until  2 weeks prior to admission when he noticed a small blister on the lower  part of his right leg.  It was itchy, but he did not scratch it.  It  started to grow in size, and on the major part of his right leg, he felt  very itchy and painful.  He denied any trauma to the site.  There was no  history of foot ulcers in the past.  He does have history of chronic leg  edema.  He denied any fever, chills, nausea, vomiting, diarrhea, or any  systemic complaints.  He also mentioned of his breathing getting worse  and mentioned of some  cough with occasional mucoid sputum.  He denied  any chest pain, hematemesis, history of DVT, pulmonary embolism, travel,  or immobility.  He mentioned about 10 pounds weight gain since earlier  visit.  He also had had a recent ED visit for bronchitis, for which he  had been given Augmentin.   PHYSICAL EXAMINATION:  VITAL SIGNS:  On examination, blood pressure  156/93, pulse 86 per minute, temperature 97.2.  GENERAL:  Alert and well developed, overweight.  HEAD, EYE, AND ENT:  Normocephalic, atraumatic.  PERRL.  Moist mucous  membranes.  NECK:  Supple.  LUNGS:  Normal respiratory effort bilaterally with good air entry.  Bibasilar crackles present.  CARDIOVASCULAR:  Normal rate.  Regular rhythm.  No murmur, no gallop,  and no rub.  ABDOMEN:  Obese, soft, nontender, and normal bowel sounds.  MUSCULOSKELETAL:  Normal range of motion.  No joint tenderness.  No  joint swelling.  PULSES:  Normal peripheral pulse.  EXTREMITIES:   Bilateral pedal edema.  Right leg, about 15-cm diameter  denuded area on the anterior leg with purulent drainage and surrounding  erythema.  Warm and tender to touch.  Local joint movements normal.  No  neurovascular deficit distally.  NEUROLOGIC:  Alert and oriented x3.  Cranial nerves II-XII intact.  Strength normal in all extremities.  Sensation intact to light touch.  PSYCH:  Oriented x3 and normally interactive.   ADMISSION LABORATORY DATA:  WBC 9.9, hemoglobin 13.9, MCV 83.1, platelet  150, ANC 6.7.  Sodium 135, potassium 3.6, chloride 97, bicarbonate 30,  glucose 262, BUN 14, creatinine 1.04.  Bilirubin 0.7, alkaline  phosphatase 75, AST 18, ALT 24, protein 6.9, albumin 2.9.  ESR 28.  TSH  1.305.  BNP less than 30.  Cholesterol 151, triglyceride 111, HDL of 25,  LDL 104, VLDL 22.  A1c 13.1.   HOSPITAL COURSE BY PROBLEM:  1. Right leg ulcer:  Initially, we wanted to rule out infectious ulcer      with involvement of soft tissue and possible osteomyelitis.  He was      placed on broad-spectrum antibiotics after obtaining wound and      blood culture samples.  We obtained an x-ray of his right leg,      which did not reveal anything abnormal.  His blood cultures also      came back negative.  His wound actually is most likely related to      venous insufficiency given history of venous ulcer.  For which, he      needs to be on long-term wound care.  He was also given diuretics      to help get rid of excessive edema.  His antibiotics were      discontinued upon discharge from the hospital.  2. Shortness of breath:  This is most likely multifactorial, related      to CHF exacerbation and obesity, as well as obstructive sleep      apnea, along with recent history of bronchitis.  We basically      titrated his diuretics.  He is using oxygen and CPAP at night time.      We ruled out acute coronary syndrome with cycled cardiac enzymes      and electrocardiograms.  3. Hypertension:  His  medications were continued.  4. Diabetes:  We continued his Lantus and continued with sliding scale      insulin.  His home regimen was started on discharge from the  hospital.   DISCHARGE DAY VITAL SIGNS:  Temperature 97.3, pulse 70, blood pressure  133/90, saturations 93% on room air.   DISCHARGE DAY LABORATORY DATA:  WBC 11.3, hemoglobin 13.6, MCV 83.3,  platelet 139, ANC 8.  Sodium 139, potassium 3.8, chloride 99,  bicarbonate 33, blood glucose 234, BUN 12, creatinine 1.14, calcium 9.3.      Dawna Part, MD  Electronically Signed      Michel Bickers, M.D.  Electronically Signed    AS/MEDQ  D:  10/04/2008  T:  10/04/2008  Job:  [id number]

## 2011-01-01 NOTE — Op Note (Signed)
NAME:  BURCHARD, GATTI NO.:  [id number]   MEDICAL RECORD NO.:  [id number]                   PATIENT TYPE:  INP   LOCATION:  3702                                 FACILITY:  [facility]   PHYSICIAN:  Richard A. Rollene Fare, M.D.          DATE OF BIRTH:  02/04/1946   DATE OF PROCEDURE:  10/24/2003  DATE OF DISCHARGE:                                 OPERATIVE REPORT   PROCEDURE:  Permanent DDDR Medtronic Ensure [id number] pulse generator, serial  number [id number], with bipolar silicone steroid-eluting passive-fixation 53  cm atrial and 58 cm ventricular electrodes.   IMPLANTING PHYSICIAN:  Richard A. Rollene Fare, M.D.   COMPLICATIONS:  None.   ESTIMATED BLOOD LOSS:  Approximately 100 mL.   ANESTHESIA:  5 mg Valium p.o. premedication, intermittent Nubain 4 mg IV  during procedure, 1% local Xylocaine.   PREOPERATIVE DIAGNOSES:  1. Documented six-second sinus pauses with symptomatic bradycardia.  2. Severe medically-complicated obesity.  3. Sleep apnea, clinical diagnosis sleeping studies pending.  4. Systemic hypertension.  5. Mild to moderate coronary artery disease with normal left ventricular     function on catheterization October 22, 2002.  6. Hypoventilation syndrome.  7. Adult-onset diabetes mellitus.  8. Normal thyroid-stimulating hormone.  9. Probable hyperlipidemia.   POSTOPERATIVE DIAGNOSES:  1. Documented six-second sinus pauses with symptomatic bradycardia.  2. Severe medically-complicated obesity.  3. Sleep apnea, clinical diagnosis sleeping studies pending.  4. Systemic hypertension.  5. Mild to moderate coronary artery disease with normal left ventricular     function on catheterization October 22, 2002.  6. Hypoventilation syndrome.  7. Adult-onset diabetes mellitus.  8. Normal thyroid-stimulating hormone.  9. Probable hyperlipidemia.   DESCRIPTION OF PROCEDURE:  The patient was brought to the second floor EP  lab in a postabsorptive state  after 5 mg Valium p.o. premedication.  The  right anterior chest was prepped, draped in the usual manner.  Xylocaine 1%  was used for local anesthesia.  The patient has severe exogenous medically-  complicated obesity making access difficult.  Aspirin had been held  preoperatively, but he had been given Plavix.  He was given 6 mg of Nubain  total for sedation in the lab IV in divided doses, monitoring blood  pressures and O2 saturations.  A right infraclavicular transverse  curvilinear incision was performed.  It was brought down to the prepectoral  fascia through significant levels of adipose tissue using blunt dissection  and electrocautery to control hemostasis.  A pulse generator pocket was  performed using blunt dissection in the prepectoral plane.  Fortunately the  subclavian vein was entered with a single anterior puncture using an 18 thin-  wall needle and using two non-peel-away Cook introducers, the atrial and  ventricular electrodes were introduced in tandem with a retained J-tip  stainless steel guidewire.  The atrial electrode was positioned in the right  atrial appendage, confirmed by fluoroscopy and rotational maneuvers.  There  was some  difficulty in positioning the right ventricular electrode because  of poor lead movement.  This was accomplished with insertion of another  sheath over the guidewire and using multiple dilators, positioning the  ventricular electrode through the 9 French peel-away sheath and then after  positioning, peeling away the sheath.  The ventricular electrode was stable.  Unipolar and bipolar thresholds were obtained.  There was no diaphragmatic  stimulation at 10-volt output, unipolar or bipolar, on atrial or ventricular  implanted electrodes.  The electrodes were secured at the insertion site  with a previously-placed #1 figure-of-eight silk suture to prevent migration  and control hemostasis.  They were further secured with two interrupted #1  silk  sutures around a silicone sewing collar for each electrode.  The pocket  was irrigated with 500 mg of kanamycin solution.  The patient had been given  1 g of Ancef prophylactic antibiotics prior to the procedure.  The  hemostasis was secured.  The generator was hooked to the atrial and  ventricular electrodes in the proper sequence with the single hex nut  tightened.  The generator was delivered into the pocket with the electrodes  looped behind and loosely secured to the underlying muscle and fascia with  #1 silk suture to prevent migration.  Subcutaneous tissue was closed with  two separate running layers of 2-0 Dexon suture, and the skin was closed  with 5-0 subcuticular Dexon suture.  Steri-Strips were applied.  Fluoroscopy  showed good position of the atrial and ventricular electrodes, and the  patient was transferred to the holding area for postoperative care and  programming in stable condition.   Bipolar thresholds:  Atrial:  P equals 11.0 mV, resistance 519 Ohms, minimal  threshold 0.6 V.   Bipolar ventricle:  R equals 17.8 mV, resistance 630 Ohms, minimal threshold  for capture 0.4 V.   Unipolar atrial:  P equals 6.1 mV, resistance 443 Ohms, threshold 0.3 V.   Unipolar ventricular:  R equals 16.0 mV, resistance 483 Ohms, minimal  threshold 0.3 V.   Magnet rate at BOL equals 85, at RRT magnet drops to 65 and reverts into VVI  mode.                                               Richard A. Rollene Fare, M.D.    RAW/MEDQ  D:  10/24/2003  T:  10/25/2003  Job:  [id number]   cc:   Alison Murray, M.D.  [address]  [place] [address]  Fax: [phone]   Cardiopulmonary Lab

## 2011-01-01 NOTE — Telephone Encounter (Signed)
Sugars are high, 347 this am before eating, checking once a day- all > 200 x 2 weeks,  a1c was 11 (confirmed in lab section of epic)  at heart doctor, is only taking Humalog one time per day, is not sure if he is taking glipizide, confirmed he is taking lantus and metformin as directed.   Went to heart doctor for difficulty breathing and they put him on pills that he is not sure of the name: his breathing is better, but he reports being dizzy, not sleeping at night, has a lot of gas has 4 more pills left to take.  Patient agreed to check blood sugar 3-4 times daily, check to see if he has been omitting the glipizide  and take Humalog 5 units before each meal. He was not at home so could not check blood sugar . Will discuss with attending physician.  Also needs an appointment - will request triage call to schedule next week and to follow up on his blood sugars

## 2011-01-01 NOTE — Assessment & Plan Note (Signed)
[facility] OFFICE NOTE   [name], [name]                     MRN:          [id number]  DATE:08/03/2006                            DOB:          Apr 15, 1946    REFERRING PHYSICIAN:  Judie Bonus, MD   REASON FOR CONSULTATION:  Hemoccult positive stool.   ASSESSMENT:  Hemoccult positive stool, asymptomatic.   RECOMMENDATIONS AND PLAN:  Schedule colonoscopy.  We will do this at the  hospital.  He is morbidly obese, and also has sleep apnea.   HISTORY:  A 65 year old African American man who was found to have  Hemoccult cards positive.  His weight fluctuates, he says.  His GI  review of systems is otherwise negative.  See my medical history for  full details on that.   PAST MEDICAL HISTORY:  1. Morbid obesity.  2. Hypertension.  3. Coronary artery disease with history of myocardial infarction and      heart failure.  4. He reports macular degeneration, pacemaker, hypertension,      dyslipidemia, gout, diabetes mellitus, previous bronchitis,      gastroesophageal reflux disease, epistaxis, nasal surgery, sleep      apnea, on CPAP, benign prostatic hypertrophy, umbilical hernia      repair, herpes simplex virus.   ALLERGIES:  NONE KNOWN.   MEDICATIONS:  1. Diovan hydrochlorothiazide 320/25 mg daily.  2. Caduet 10/20 mg daily.  3. Clonidine 0.1 mg daily.  4. Atenolol 100 mg daily.  5. Glipizide/metformin b.i.d.  6. Bidil 20/37.5 mg daily.  7. Lantus 50 units b.i.d.  8. Colchicine 0.6 mg daily.  9. Fluconazole 150 mg daily.  10.Aspirin 325 mg daily.  11.Nebulizer daily.   FAMILY HISTORY:  No colon cancer.  He reports a history of heart disease  in his siblings and his mother, as well as diabetes in siblings.   SOCIAL HISTORY:  He is single, 2 sons, 2 daughters.  Lives alone.  Occasional alcohol.  No tobacco or drugs.   REVIEW OF SYSTEMS:  Chronic dyspnea on exertion.  Sleeping  difficulty.  Osteoarthritis.  Coughing.  Pedal edema.  Allergies.  All other systems  negative.   PHYSICAL EXAMINATION:  Reveals a morbidly obese, middle-aged black man.  Height 6 feet.  Weight 350 pounds.  Blood pressure 128/72.  Pulse 60.  Eyes anicteric.  ENT:  Missing many teeth.  Those that remain are in  fair repair at best.  Mouth is otherwise clear.  NECK:  Supple.  Thick.  No mass.  CHEST:  Clear.  HEART:  S1 and S2.  I hear no rubs, murmurs or gallops.  There is a  pacemaker in the right upper chest area near the clavicle.  ABDOMEN:  Morbidly obese.  Soft and non-tender.  No obvious  organomegaly.  He has an umbilical hernia repair scar.  RECTAL:  Exam is deferred.  LYMPHATIC:  No neck or supraclavicular nodes palpated.  EXTREMITIES:  Show 1+ peripheral edema bilaterally.  SKIN:  Warm and dry.  No acute rash noted.  NEUROLOGIC/PSYCHIATRIC:  He is alert and oriented x3.   I appreciate the  opportunity to care for this patient.  I have reviewed  the office notes sent from the outpatient clinic.     Gatha Mayer, MD,FACG  Electronically Signed    CEG/MedQ  DD: 08/03/2006  DT: 08/03/2006  Job #: [id number]   cc:   Judie Bonus, MD

## 2011-01-01 NOTE — Discharge Summary (Signed)
NAME:  [name], [name] NO.:  [id number]   MEDICAL RECORD NO.:  [id number]          PATIENT TYPE:  INP   LOCATION:  1428                         FACILITY:  [facility]   PHYSICIAN:  Sammuel Hines. Daiva Nakayama, M.D. DATE OF BIRTH:  April 23, 1946   DATE OF ADMISSION:  07/26/2005  DATE OF DISCHARGE:  08/01/2005                                 DISCHARGE SUMMARY   HISTORY:  Mr. Kopas is a 65 year old gentleman who presented to the  urgent office with an incarcerated ventral hernia.  He also had diabetes and  significant coronary artery disease.  He was brought to the operating room  on July 26, 2005 where his hernia was repaired primarily.  No mesh was  used because of some cellulitis of the skin.  Postoperatively he was placed  in the unit.  The medical teaching service was consulted to help [place] manage  his medical comorbidities.  On postoperative day one he was started on clear  liquids.  He appeared to be stable and was then transferred to a telemetry  bed.  He made very gradual improvement over the next week.  The medical  doctors were able to effectively manage his coronary disease and diabetes.  He was also on a C-PAP machine for COPD at night and he seemed to tolerate  this fairly well.  Again over the next week on the floor his diet was able  to be advanced.  His activity increased to the point where he could move  around better.  His pain was controlled with IV and oral pain medicines.  By  August 01, 2005 he was stable and ready for discharge home.   MEDICATIONS AT THE TIME OF DISCHARGE:  1.  He was to resume his home medications.  2.  He was given a prescription for Vicodin for pain.   ACTIVITY:  No heavy lifting.   FINAL DIAGNOSIS:  Incarcerated ventral hernia.   DIET:  Diabetic diet as tolerated.   FOLLOW UP:  With Dr. Marlou Starks in the next two weeks and the patient is  discharged home.      Sammuel Hines. Daiva Nakayama, M.D.  Electronically Signed     PST/MEDQ  D:   08/12/2005  T:  08/12/2005  Job:  [id number]

## 2011-01-02 ENCOUNTER — Other Ambulatory Visit: Payer: Self-pay | Admitting: Internal Medicine

## 2011-01-06 ENCOUNTER — Ambulatory Visit (INDEPENDENT_AMBULATORY_CARE_PROVIDER_SITE_OTHER): Payer: Medicare HMO | Admitting: Internal Medicine

## 2011-01-06 ENCOUNTER — Encounter: Payer: Self-pay | Admitting: Internal Medicine

## 2011-01-06 DIAGNOSIS — I10 Essential (primary) hypertension: Secondary | ICD-10-CM

## 2011-01-06 DIAGNOSIS — R5383 Other fatigue: Secondary | ICD-10-CM | POA: Insufficient documentation

## 2011-01-06 DIAGNOSIS — E1165 Type 2 diabetes mellitus with hyperglycemia: Secondary | ICD-10-CM

## 2011-01-06 DIAGNOSIS — J449 Chronic obstructive pulmonary disease, unspecified: Secondary | ICD-10-CM

## 2011-01-06 DIAGNOSIS — R0602 Shortness of breath: Secondary | ICD-10-CM | POA: Insufficient documentation

## 2011-01-06 DIAGNOSIS — E785 Hyperlipidemia, unspecified: Secondary | ICD-10-CM

## 2011-01-06 MED ORDER — INSULIN LISPRO PROT & LISPRO (75-25 MIX) 100 UNIT/ML ~~LOC~~ SUSP: 60.0000 [IU] | Freq: Two times a day (BID) | SUBCUTANEOUS | Status: DC

## 2011-01-06 NOTE — Patient Instructions (Addendum)
Keep your insulin the same for now.  When you get your new insulin Stop the old insulin and start the Humalog 75/25 60 units with breakfast and 60 units with supper.  Check your blood sugar twice daily.  Remember the "the 4 befores"  Before breakfast, before lunch, before supper, and before bedtime.  Choose two and do them on odd number days and the other two and even number days.    Be sure to bring your monitor to your follow up appointment for [place] to review.  We will arrange to meet with physical therapy to have them design an exercise program for you to help with your endurance.  You can use the inhaler you have to help with your shortness of breath when you walk.  Follow up in 2-3 weeks to see how your doing with the new insulin.

## 2011-01-06 NOTE — Progress Notes (Signed)
Subjective:    Patient ID: [name], male    DOB: Dec 30, 1945, 65 y.o.   MRN: [id number]  HPI  [name] is a 65 year old man who presents to clinic today complaining of occasional SOB over the last 3 weeks.  He states that it is worse while walking and resolves if he stops to rest.  He denies wheezing, dizziness, nausea, vomiting, cough, increased sputum, blood in his stool, swelling in his legs, or orthopnea.  He does state that when he sleeps with his CPAP and 1 pillow and without his CPAP he needs to use 3 pillows but this is unchanged for the last few months.    His diabetes control has improved in the recent past but he states that he is still not taking his humalog more then once daily.  He only checks his blood sugar in the mornings and takes his both of his insulins then.  He brought his meter today and his blood sugars are all elevated in the morning ranging 200-400s.  He states that he has been having to use the bathroom more often and he is drinking more water because he is always thirsty.    He followed up with his heart doctor recently and was started on a medication once weekly.   He was initially unsure what the medication was but when we reviewed his medicine list he stated to me that it was pravastatin.  He also has Crestor on his list but states that he is taking the pravastatin not the Crestor.    He states that he has finally been able to get his colchicine on a regular basis and since then his gout has been well controlled.  He states that because he couldn't get it easily for a while his gout flare lasted almost a full month and when he has his flares he is unable to get out of bed because of the stiffness and pain.    Review of Systems    Constitutional: Denies fever, chills, diaphoresis, appetite change and fatigue.  HEENT: Denies photophobia, eye pain, redness, hearing loss, ear pain, congestion, sore throat, rhinorrhea, sneezing, mouth sores, trouble swallowing,  neck pain, neck stiffness and tinnitus.   Respiratory: Denies SOB, DOE, cough, chest tightness,  and wheezing.   Cardiovascular: Denies chest pain, palpitations and leg swelling.  Gastrointestinal: Denies nausea, vomiting, abdominal pain, diarrhea, constipation, blood in stool and abdominal distention.  Endocrine:  Positive for polydipsia and polyuria.  Genitourinary: Denies dysuria, urgency, frequency, hematuria, flank pain and difficulty urinating.  Musculoskeletal: Denies myalgias, back pain, joint swelling, arthralgias and gait problem.  Skin: Denies pallor, rash and wound.  Neurological: Denies dizziness, seizures, syncope, weakness, light-headedness, numbness and headaches.  Hematological: Denies adenopathy. Easy bruising, personal or family bleeding history  Psychiatric/Behavioral: Denies suicidal ideation, mood changes, confusion, nervousness, sleep disturbance and agitation  Objective:   Physical Exam    Constitutional: Vital signs reviewed.  Patient is a morbidly obese man in no acute distress, speaking in full sentences, and cooperative with exam. Alert and oriented x3.  Head: Normocephalic and atraumatic Ear: TM normal bilaterally Mouth: no erythema or exudates, MMM Eyes: PERRL, EOMI, conjunctivae normal, No scleral icterus.  Neck: Supple, Trachea midline normal ROM, No JVD, mass, thyromegaly, or carotid bruit present.  Cardiovascular: RRR, S1 normal, S2 normal, no MRG, pulses symmetric and intact bilaterally Pulmonary/Chest: CTAB, no wheezes, rales, or rhonchi Abdominal: Soft. Non-tender, non-distended, bowel sounds are normal, no masses, organomegaly, or guarding present.  GU: no CVA tenderness Musculoskeletal: No joint deformities, erythema, or stiffness, ROM full and no nontender Hematology: no cervical, inginal, or axillary adenopathy.  Neurological: A&O x3, Strenght is normal and symmetric bilaterally, cranial nerve II-XII are grossly intact, no focal motor deficit,  sensory intact to light touch bilaterally.  Skin: Warm, dry and intact. No cyanosis, or clubbing. There are changes consistent with chronic venous statis in the bilateral lower extremities.  Trace edema to the bilateral lower extermities.  Psychiatric: Normal mood and affect. speech and behavior is normal. Judgment and thought content normal. Cognition and memory are normal.    Assessment & Plan:

## 2011-01-08 NOTE — Assessment & Plan Note (Signed)
BP Readings from Last 3 Encounters:  01/06/11 150/79  11/25/10 127/76  11/18/10 136/81   Blood pressure is mildly elevated at todays visit.  He has not been taking his lisinopril and he was encouraged to continue taking it.  We will need to follow up at his next visit to see how he is doing and encourage him to bring his medications with him.

## 2011-01-08 NOTE — Assessment & Plan Note (Signed)
He has multiple reasons to be short of breath including his known COPD, CHF, as well as being sedentary for the last month or so because of his gout flare.  He has not been doing his usual workouts because of fatigue and the progressive SOB with activity.  His lungs are clear today and he appears to be euvolemic with no signs of increasing orthopnea so COPD and CHF are lower on my differential though could still be contributing.  I think the major portion of his SOB on exertion is due to deconditioning from not being able to be as active for the last month.  We will have him visit physical therapy so they can work with him in a cardiac and pulmonary rehab capacity to get him back on his feet and follow up to ensure he is improving.

## 2011-01-08 NOTE — Assessment & Plan Note (Signed)
He is completely unsure as to which medication he is taking for his cholesterol.  He told me pravastatin once weekly and told [name], RDE that it was crestor.  He is encouraged to bring all his medications to his follow up and to continue taking whichever one it is until then.

## 2011-01-08 NOTE — Assessment & Plan Note (Signed)
Lab Results  Component Value Date   HGBA1C 11.3 10/22/2010   HGBA1C 14.6 06/29/2010   HGBA1C 11.9 03/25/2010   Lab Results  Component Value Date   MICROALBUR 22.02* 03/25/2010   LDLCALC 118 06/29/2010   CREATININE 0.97 10/22/2010   His A1C is improving slowly but he continues to not take his insulin with meals on a regular basis.  I think the major portion of this is he is not able to grasp the concept.  My goal would be to simplify his life and change him from lantus and humalog to a Humalog 75/25 mixture.  It is still available in a pen and would be something he takes twice daily with breakfast and supper.  I stressed to him that he needs to take it EVERY day at breakfast and supper and continue to check his blood sugars at minimum twice daily.  We discuss testing before breakfast, before lunch, before supper, and before bed, choosing two and alternating days.  Given his weight he will likely need between 148-178 units of insulin daily.  We will start low at 60 units BID and have him folllow up.  We will see him back in about 2 weeks and will likely need to titrate up the dose to achieve better control while avoiding hypoglycemia.

## 2011-01-08 NOTE — Assessment & Plan Note (Signed)
His fatigue is multifactorial including deconditioning, his poorly controlled diabetes, COPD, and CHF.  We will have him visit physical therapy to help him get more active, continue to encourage his use of his CPAP at night, and also to continue taking his medications to control his COPD and CHF.

## 2011-01-14 ENCOUNTER — Other Ambulatory Visit: Payer: Self-pay | Admitting: Dietician

## 2011-01-14 NOTE — Telephone Encounter (Signed)
Per patient: overwhelmed with obtaining medicines, getting headaches, no energy, week and drowsy, sugars still high, hasn't gotten his new insulin. Not sure he is taking his medicines correctly(has difficulty with vision).  Phone has been out for 3 days- reordered insulin today and should get it in next 3-5 days.   He is calling to request  help with medicines, cutting toenails, reading bills and all the medicine papers- doesn't think he needs doctor appointment.  Will place social work consult and alert his PCP. Told patient we would be in contact with him

## 2011-01-15 NOTE — Telephone Encounter (Signed)
I agree with the plan! Thank you

## 2011-01-18 ENCOUNTER — Telehealth: Payer: Self-pay | Admitting: *Deleted

## 2011-01-18 ENCOUNTER — Telehealth: Payer: Self-pay | Admitting: Licensed Clinical Social Worker

## 2011-01-18 NOTE — Telephone Encounter (Signed)
Called [name] and she said that patient is on low income subsidy and would qualify for [facility].   Will discuss with team.    Called patient and he said he is needing help with med organization, scheduling of appmts, and reading bills and paperwork.  He said he is legally blind in both eyes.   He does not have full Medicaid.   I discussed [facility] with him and he is amenable to my making that referral to see if they would assist him.

## 2011-01-18 NOTE — Telephone Encounter (Signed)
Called Stevan Born at [facility] and she advised that patient was on low income subsidy and could get onto their program to hel

## 2011-01-18 NOTE — Telephone Encounter (Signed)
Call from pt's nurse Nunzio Cory.  Pt is currently getting Allopurinol and Colchicine 0.6 mg.   Call to [facility].   Pt got a prescription for the Allopurinol from Dr. Dan Maker.  Pt last received the Allopurinol last week.   Pt has not received the Colchicine since January of 2012.  Colchicine to be placed on hold by the pharmacy since the pt is getting Allopurinol.  Pt's new prescription for Humalog Mix 72/25 is being shipped today and should arrive to pt in the next couple of days.

## 2011-01-19 ENCOUNTER — Other Ambulatory Visit: Payer: Self-pay | Admitting: Internal Medicine

## 2011-01-19 ENCOUNTER — Other Ambulatory Visit: Payer: Self-pay | Admitting: Licensed Clinical Social Worker

## 2011-01-19 DIAGNOSIS — H548 Legal blindness, as defined in USA: Secondary | ICD-10-CM

## 2011-01-19 NOTE — Telephone Encounter (Signed)
Called patient: he still doesn't have new insulin. [facility]. He asked that [facility]. I called and they are able to give him 2 boxes for $6.50 to last him 25 days. Hopefully by that time, it will have arrived from [place]. Also requested 70mm pen needles and corrected patients medication list.

## 2011-01-19 NOTE — Telephone Encounter (Signed)
Referral was made to [facility] thru Cammy [facility].  In the meantime, I will refer to [facility] for home safety eval, medication mgmt, and social work consult to see if they have any other ideas.

## 2011-01-20 ENCOUNTER — Telehealth: Payer: Self-pay | Admitting: Dietician

## 2011-01-20 MED ORDER — INSULIN PEN NEEDLE 31G X 8 MM MISC: Status: DC

## 2011-01-20 NOTE — Telephone Encounter (Signed)
I called Katharine Look RN who verifies Dr Dan Maker has decreased norvasc to 5 mg a day, zaroxylin every other day.   Also she wants to know if pt should still be on lasix. Also pt still does not have the correct insulin in home.  He will get today.   Presently CBG is 520

## 2011-01-20 NOTE — Telephone Encounter (Signed)
Call from [place] with [facility] about blood sugar medicines and need for an appointment for patient: 1-  patient has not gotten new insulin yet, Blood sugar 534, used 75 units lantus at 1 Pm, sliding scale goes up to 10 units is that enough? At 930 he ate peanut butter and jelly and grape juice adn took no humalog insulin. Instructed patient/Sandra to take 20 units of rapid acting insulin now and to recheck his blood sugar in 1-2 hours and then again tonight. He is to continue to take old insulin (sandra does not think he is able to differentiate his old insulin from his new insulin ) until he brings all his medicines to office this week.     2- Katharine Look concerned about diuretics; Not sure if we were aware of newest medicines per Dr Dan Maker norvasc 5 mg per day zaroxylin 2.5 mg every other day- which he is out of right now and sandra cannot tell if he has refills because bottle is not present allopurinol 100 mg daily. Still on Lasix 40 mg twice daily- no indication in Dr Layla Barter note that he wants him to stop the lasix. Wonders if Dr. Obie Dredge knew Dr.Weitrab made changes. Should he continue to take lasix 40 mg twice a day in addition to zarolxylin?  3-  Needs appointment- was due in 2 weeks which is tomorrow and he does not have an apoitnment.   I will ask triage to call regardng answer for diuretic and an appointment for this week in the next hour while sandra is still at his house.

## 2011-01-20 NOTE — Telephone Encounter (Signed)
Appointment scheduled for tomorrow AM. Pt will bring all meds.` I called Dr Layla Barter office for last note May 25th.  They will fax to [place]. Pt voices understanding of above.

## 2011-01-20 NOTE — Telephone Encounter (Signed)
1. The order for rapid acting insulin was OK 2. Diuretics - This needs to be decided by PCP and or [place]. Can we his notes? He doesn't participate in EPIC not centricty. Making an appt for tomorrow, if he brings in all meds, is OK but we still need office note

## 2011-01-21 ENCOUNTER — Ambulatory Visit (INDEPENDENT_AMBULATORY_CARE_PROVIDER_SITE_OTHER): Payer: Medicare HMO | Admitting: Internal Medicine

## 2011-01-21 ENCOUNTER — Encounter: Payer: Self-pay | Admitting: Internal Medicine

## 2011-01-21 ENCOUNTER — Other Ambulatory Visit: Payer: Self-pay | Admitting: Dietician

## 2011-01-21 VITALS — BP 115/76 | HR 84 | Temp 96.7°F | Ht 72.0 in | Wt 322.0 lb

## 2011-01-21 DIAGNOSIS — I10 Essential (primary) hypertension: Secondary | ICD-10-CM

## 2011-01-21 DIAGNOSIS — IMO0001 Reserved for inherently not codable concepts without codable children: Secondary | ICD-10-CM

## 2011-01-21 DIAGNOSIS — M109 Gout, unspecified: Secondary | ICD-10-CM

## 2011-01-21 DIAGNOSIS — E1165 Type 2 diabetes mellitus with hyperglycemia: Secondary | ICD-10-CM

## 2011-01-21 DIAGNOSIS — I2510 Atherosclerotic heart disease of native coronary artery without angina pectoris: Secondary | ICD-10-CM

## 2011-01-21 LAB — GLUCOSE, CAPILLARY: Glucose-Capillary: 398 mg/dL — ABNORMAL HIGH (ref 70–99)

## 2011-01-21 NOTE — Assessment & Plan Note (Signed)
His Lantus is not changed to Humalog to better control his sugars. His CBG readings are in excess of 300 almost all the time. I started him on60 units twice daily. No sliding scale insulin as advised at this time.

## 2011-01-21 NOTE — Assessment & Plan Note (Signed)
His cardiologist has decreased Norvasc dose to 5 mg. I suspect that clonidine is playing a role in his weakness and drowsiness throughout the day. Advised the family to call the cardiologist can see the clonidine can be of placed by another medication.

## 2011-01-21 NOTE — Assessment & Plan Note (Signed)
No ongoing chest pain. Followed by cardiologist. Continue Coreg and aspirin as prescribed

## 2011-01-21 NOTE — Patient Instructions (Signed)
Return in one month.  Call us if your sugars >300 for 2-3 days.

## 2011-01-21 NOTE — Progress Notes (Signed)
  Subjective:    Patient ID: [name], male    DOB: September 22, 1945, 66 y.o.   MRN: [id number]  HPI [name] is herefor followup. The main focus of this interaction is to a certain his insulin dose.pupils that he feels drowsy throughout the day and feels weak at times. He saw Dr. Rollene Fare at the [facility] cardiology group yesterday. Her were several changes made to his medications he has brought papers to be reflected in our drug reconciliation.he has not started his new insulin. He has no other complaint.   Review of Systems  Constitutional: Positive for fatigue. Negative for fever, chills, activity change and appetite change.  HENT: Negative for nosebleeds, facial swelling, neck pain and tinnitus.   Eyes: Negative for pain, discharge and visual disturbance.  Respiratory: Negative for cough, chest tightness and shortness of breath.   Cardiovascular: Positive for leg swelling. Negative for chest pain and palpitations.  Gastrointestinal: Negative for nausea, vomiting, abdominal pain, blood in stool and abdominal distention.  Skin: Negative for rash.  Neurological: Negative for dizziness, seizures, weakness and headaches.  Psychiatric/Behavioral: Negative for suicidal ideas, confusion and agitation.       Objective:   Physical Exam  Constitutional: He is oriented to person, place, and time. He appears well-developed and well-nourished. He appears lethargic. He appears distressed.  HENT:  Head: Normocephalic and atraumatic.  Right Ear: External ear normal.  Left Ear: External ear normal.  Eyes: Conjunctivae and EOM are normal. Pupils are equal, round, and reactive to light. Right eye exhibits no discharge. Left eye exhibits no discharge.  Neck: Normal range of motion. Neck supple. No thyromegaly present.  Cardiovascular: Normal rate and regular rhythm.   No murmur heard. Pulmonary/Chest: Effort normal. No respiratory distress. He has no wheezes. He has rales.  Abdominal: Soft.  Bowel sounds are normal. He exhibits distension. He exhibits no mass. There is no tenderness. There is no rebound and no guarding.  Musculoskeletal: Normal range of motion.  Neurological: He is oriented to person, place, and time. He has normal reflexes. He appears lethargic. No cranial nerve deficit. Coordination normal.  Skin: No rash noted. He is not diaphoretic. No erythema.  Psychiatric: He has a normal mood and affect. His behavior is normal. Judgment and thought content normal.          Assessment & Plan:

## 2011-01-22 ENCOUNTER — Telehealth: Payer: Self-pay | Admitting: Dietician

## 2011-01-22 NOTE — Telephone Encounter (Signed)
Took two doses of his new insulin: just checked blood sugar it is 286. He verbalizes understanding to eat after taking insulin. Note no follow up appointment made.  Note Humalog quick pen still on medication list in addition to new insulin. Will route to PCP to remove.  Have sent message to front office to call patient to schedule follow up

## 2011-01-28 ENCOUNTER — Other Ambulatory Visit: Payer: Self-pay | Admitting: Internal Medicine

## 2011-01-28 DIAGNOSIS — E1165 Type 2 diabetes mellitus with hyperglycemia: Secondary | ICD-10-CM

## 2011-02-01 ENCOUNTER — Telehealth: Payer: Self-pay | Admitting: *Deleted

## 2011-02-01 NOTE — Telephone Encounter (Signed)
[facility] calls and ask for a verbal order for PT eval and treat, due to pt calling and stating he needs help getting in the shower, i gave her the ok, is this ok with you?

## 2011-02-01 NOTE — Telephone Encounter (Signed)
Yes [name] that is great Thank you Ailee Pates

## 2011-02-03 ENCOUNTER — Encounter: Payer: Self-pay | Admitting: Internal Medicine

## 2011-02-19 ENCOUNTER — Encounter: Payer: Self-pay | Admitting: Internal Medicine

## 2011-02-19 ENCOUNTER — Ambulatory Visit (INDEPENDENT_AMBULATORY_CARE_PROVIDER_SITE_OTHER): Payer: Medicare HMO | Admitting: Internal Medicine

## 2011-02-19 DIAGNOSIS — E1165 Type 2 diabetes mellitus with hyperglycemia: Secondary | ICD-10-CM

## 2011-02-19 DIAGNOSIS — F329 Major depressive disorder, single episode, unspecified: Secondary | ICD-10-CM

## 2011-02-19 DIAGNOSIS — E785 Hyperlipidemia, unspecified: Secondary | ICD-10-CM

## 2011-02-19 DIAGNOSIS — I10 Essential (primary) hypertension: Secondary | ICD-10-CM

## 2011-02-19 LAB — GLUCOSE, CAPILLARY: Glucose-Capillary: 177 mg/dL — ABNORMAL HIGH (ref 70–99)

## 2011-02-19 LAB — TSH: TSH: 2.158 u[IU]/mL (ref 0.350–4.500)

## 2011-02-19 NOTE — Progress Notes (Signed)
  Subjective:    Patient ID: [name], male    DOB: [birth year], 65 y.o.   MRN: [id number]  HPI  Patient is 65 year old male with multiple medical conditions including hypertension, diabetes, hyperlipidemia, coronary artery disease who presents to [facility] clinic for regular followup. One month ago he was here and was told that his diabetes is uncontrolled as well as his blood pressure. He was advised to increase his insulin dose and was advised to change his diet and try to exercise. Patient reports feeling better and compliance with his medications as well as recommended diet and exercising. He does mention continuing to feel weak and tired most of the time especially if he is outside. He denies recent sicknesses or hospitalizations, no episodes of chest pain no fevers or chills, no abdominal or urinary concerns. He reports checking his blood pressure regularly and the numbers are usually less than 130/80 and he also reports checking his sugar levels regularly. He also brought glucose monitor with him today. She denies changes in appetite, weight, sleeping patterns, denies heat or cold intolerance, denies suicidal or homicidal ideations.  Review of Systems    per HPI Objective:   Physical Exam Constitutional: Vital signs reviewed.  Patient is a well-developed and well-nourished in no acute distress and cooperative with exam. Alert and oriented x3.   Eyes: PERRL, EOMI, conjunctivae normal, No scleral icterus.  Neck: Supple, Trachea midline normal ROM, No JVD, mass, thyromegaly, or carotid bruit present.  Cardiovascular: RRR, S1 normal, S2 normal, no MRG, pulses symmetric and intact bilaterally Pulmonary/Chest: CTAB, no wheezes, rales, or rhonchi Abdominal: Soft. Non-tender, non-distended, bowel sounds are normal, no masses, organomegaly, or guarding present.  Neurological: A&O x3, Strenght is normal and symmetric bilaterally, cranial nerve II-XII are grossly intact, no focal motor deficit,  sensory intact to light touch bilaterally.  Skin: Warm, dry and intact. No rash, cyanosis, or clubbing.  Psychiatric: Normal mood and affect. speech and behavior is normal. Judgment and thought content normal. Cognition and memory are normal.          Assessment & Plan:

## 2011-02-19 NOTE — Patient Instructions (Signed)
Please schedule appointment in one month. Also check her blood pressure regularly and call us back if her numbers are higher than 140/90. Next appointment we will check A1c to assess control of diabetes. In addition we will check electrolyte panel to ensure that there potassium and kidney function tests are within normal limit.

## 2011-02-19 NOTE — Assessment & Plan Note (Signed)
Cholesterol and LDL are trending down on current medication regimen. We will reassess fasting lipid panel on patient next appointment. For now we'll continue the same medication regimen.

## 2011-02-19 NOTE — Assessment & Plan Note (Signed)
Patient's diabetes is uncontrolled based on recent A1c is 11.5. He reports compliance with his Lantus and also reports checking his blood sugar levels regularly. He brought his monitor today and we will download the meter and evaluate the sugar level control. I emphasized importance of compliance with diabetes medications as well as compliance with diet. I also encouraged exercising 20 minutes a day 5/7 days a week. Patient has had eye doctor appointment approximately to 3 months ago and was told he has macular degeneration as well as cataract. I had examined his feet today and patient has rather thickened nails in both feet along with decreased sensation along the lateral aspects of bilateral feet. This appears to be unchanged compared to last foot exam.. we will evaluate glucose monitor and will readjust medication regimen if indicated.

## 2011-02-19 NOTE — Assessment & Plan Note (Signed)
Blood pressure is out of the target goal. Patient reports checking blood pressure at home regularly and the readings are usually less than 130/80. He also reports compliance with his medications but did not bring the medication bottles with him to the visit today. I have advised patient to ask his blood pressure regularly once a week and to call his back if his readings are persistently higher than 140/90. In addition I have advised patient to bring the medication bottles to his appointment in one month. We will not make any changes to medication regimen today however he blood pressure is persistently high increasing the dose of current medication Lasix or addition of new antihypertensive is reasonable.

## 2011-02-19 NOTE — Assessment & Plan Note (Signed)
Patient has long history of depression and his persistent fatigue and tiredness are certainly worrisome for uncontrolled depression. In addition this could be related to uncontrolled diabetes, for example high sugar levels are too low sugar levels. His latest A1c one month ago was 11.5 indicating uncontrolled diabetes. I emphasized glucose control and importance of regular sugar monitoring. In addition will check a TSH today to rule out hypothyroidism. His TSH is negative and patient persistently experiences fatigue and the symptoms mentioned in history of present illness consider starting antidepressant such as SSRI.

## 2011-03-02 ENCOUNTER — Ambulatory Visit: Payer: Medicare HMO | Attending: Internal Medicine | Admitting: Physical Therapy

## 2011-03-02 DIAGNOSIS — M6281 Muscle weakness (generalized): Secondary | ICD-10-CM | POA: Insufficient documentation

## 2011-03-02 DIAGNOSIS — IMO0001 Reserved for inherently not codable concepts without codable children: Secondary | ICD-10-CM | POA: Insufficient documentation

## 2011-03-02 DIAGNOSIS — R262 Difficulty in walking, not elsewhere classified: Secondary | ICD-10-CM | POA: Insufficient documentation

## 2011-03-09 ENCOUNTER — Ambulatory Visit: Payer: Medicare HMO | Admitting: Physical Therapy

## 2011-03-12 ENCOUNTER — Ambulatory Visit: Payer: Medicare HMO | Admitting: Physical Therapy

## 2011-03-15 ENCOUNTER — Ambulatory Visit: Payer: Medicare HMO | Admitting: Physical Therapy

## 2011-03-16 ENCOUNTER — Encounter: Payer: Medicare HMO | Admitting: Internal Medicine

## 2011-03-16 ENCOUNTER — Ambulatory Visit (INDEPENDENT_AMBULATORY_CARE_PROVIDER_SITE_OTHER): Payer: Medicare HMO | Admitting: Internal Medicine

## 2011-03-16 ENCOUNTER — Encounter: Payer: Self-pay | Admitting: Internal Medicine

## 2011-03-16 ENCOUNTER — Telehealth: Payer: Self-pay | Admitting: *Deleted

## 2011-03-16 DIAGNOSIS — R0602 Shortness of breath: Secondary | ICD-10-CM

## 2011-03-16 DIAGNOSIS — E119 Type 2 diabetes mellitus without complications: Secondary | ICD-10-CM

## 2011-03-16 LAB — GLUCOSE, CAPILLARY

## 2011-03-16 MED ORDER — FLUTICASONE PROPIONATE HFA 220 MCG/ACT IN AERO: 1.0000 | INHALATION_SPRAY | Freq: Two times a day (BID) | RESPIRATORY_TRACT | Status: DC

## 2011-03-16 NOTE — Telephone Encounter (Signed)
Pt called stating he has had a problem breathing for a few weeks.     Also SOB, needs to stop after taking a few steps.   Eyes are heavy , not sleeping well. Sleeps better when sitting up.  Will see today at 1500

## 2011-03-16 NOTE — Patient Instructions (Addendum)
Please start using new inhaler Flovent 1 puff two times a day, along with existing inhaler. Please follow up in 1 week Please take Lasix or furosemide 2 tablets twice a day for one week Please take Metolazone 2.5 mg every other day Please follow up in 1 week

## 2011-03-16 NOTE — Progress Notes (Signed)
  Subjective:    Patient ID: [name], male    DOB: 02-14-1946, 65 y.o.   MRN: [id number]  HPI [name] is a 65 year old gentleman with past medical history significant for type 2 diabetes, hyperlipidemia, gout, macular degeneration, hypertension, COPD and coronary artery disease who presents to the clinic today for shortness of breath. States he is feeling really short of breath. Walks 2-3 blocks and has to stop because he is very short of breath. Weight is up 5 lbs since last visit which was approximately 2 weeks ago. Patient denies wheezing however reports using his albuterol inhaler more than usual. He reports only mild relief with the use of this inhaler. Patient is not sure if his worsening shortness of breath is secondary to a humidity. Patient reports that he has to travel by bus and often is waiting outside. When medications were reviewed with patient he reports taking his water pill as directed. When asked about metolazone patient was not sure if he is taking this medication. He does not recall taking a medication every other day. However his pillbox is organized by his family member and thus he's not sure if he is taking the medication.   Patient has no other complaints or concerns today. He denies chest pain, cough, sob, headache, N/V, changes in abdominal and urinary character.   Review of Systems  All other systems reviewed and are negative.       Objective:   Physical Exam  Constitutional: He is oriented to person, place, and time. He appears well-developed.       Morbidly obese  HENT:  Head: Normocephalic.  Neck: Normal range of motion. Neck supple.  Cardiovascular: Normal rate and regular rhythm.   Pulmonary/Chest:       No wheezing, rales, crackles heard on exam Auscultation exam was somewhat difficult due to body habitus  Abdominal: Soft. Bowel sounds are normal.  Musculoskeletal: Normal range of motion.       There was 2+ pitting edema in both lower  extremities that patient notes being his baseline.  Neurological: He is alert and oriented to person, place, and time.          Assessment & Plan:   No problem-specific assessment & plan notes found for this encounter.

## 2011-03-17 ENCOUNTER — Ambulatory Visit: Payer: Self-pay | Attending: Internal Medicine | Admitting: Physical Therapy

## 2011-03-17 DIAGNOSIS — R0602 Shortness of breath: Secondary | ICD-10-CM | POA: Insufficient documentation

## 2011-03-17 DIAGNOSIS — R262 Difficulty in walking, not elsewhere classified: Secondary | ICD-10-CM | POA: Insufficient documentation

## 2011-03-17 DIAGNOSIS — M6281 Muscle weakness (generalized): Secondary | ICD-10-CM | POA: Insufficient documentation

## 2011-03-17 DIAGNOSIS — IMO0001 Reserved for inherently not codable concepts without codable children: Secondary | ICD-10-CM | POA: Insufficient documentation

## 2011-03-17 NOTE — Assessment & Plan Note (Signed)
Shortness of breath likely multifactorial secondary to fluid overload as well as mild asthma exacerbation. Patient's weight up 5 pounds from last visit seen 2 weeks ago, although clinical exam is hard to determine if patient was overloaded. Patient also not wheezing however with increased humidity the patient might be feeling short of breath secondary to his asthma that is only managed with albuterol. Patient's last echo was done in May 2004 which showed a normal systolic function and ejection fraction. There is no note of diastolic dysfunction however given his complaints of shortness of breath, there may be some diastolic dysfunction. She is also morbidly obese with hypertension and thus he may have a component of obesity hypoventilation syndrome. Patient would need a sleep study in the future for further management of this problem. Plan: Will increase his Lasix dose to 2 tablets taken orally twice a day, that will be double the dose, for one week Continue metolazone at current dose Flovent for further management of asthma or or COPD Sleep study in the future. Patient may need CPAP  Followup in one week

## 2011-03-23 ENCOUNTER — Ambulatory Visit: Payer: Medicare HMO | Admitting: Physical Therapy

## 2011-03-23 ENCOUNTER — Ambulatory Visit: Payer: Medicare HMO | Admitting: Internal Medicine

## 2011-03-25 ENCOUNTER — Ambulatory Visit: Payer: Medicare HMO | Admitting: Physical Therapy

## 2011-03-29 ENCOUNTER — Ambulatory Visit: Payer: Medicare HMO | Admitting: Physical Therapy

## 2011-03-31 ENCOUNTER — Ambulatory Visit: Payer: Medicare HMO | Admitting: Physical Therapy

## 2011-04-05 ENCOUNTER — Ambulatory Visit: Payer: Medicare HMO | Admitting: Physical Therapy

## 2011-04-07 ENCOUNTER — Ambulatory Visit: Payer: Medicare HMO | Admitting: Physical Therapy

## 2011-05-04 ENCOUNTER — Ambulatory Visit (INDEPENDENT_AMBULATORY_CARE_PROVIDER_SITE_OTHER): Payer: PRIVATE HEALTH INSURANCE | Admitting: Pulmonary Disease

## 2011-05-04 ENCOUNTER — Encounter: Payer: Self-pay | Admitting: Pulmonary Disease

## 2011-05-04 VITALS — BP 122/76 | HR 57 | Temp 97.8°F | Ht 72.0 in | Wt 329.8 lb

## 2011-05-04 DIAGNOSIS — G4733 Obstructive sleep apnea (adult) (pediatric): Secondary | ICD-10-CM | POA: Insufficient documentation

## 2011-05-04 NOTE — Patient Instructions (Signed)
Will start back on cpap Work on weight loss followup with me in 4 weeks, and bring your machine to the visit.

## 2011-05-04 NOTE — Assessment & Plan Note (Signed)
The patient has very severe sleep apnea by his study from 2005, and feels that he did well on CPAP when it was available.  He currently does not have a CPAP device, and has severe daytime sleepiness.  He is unable to stay awake during our visit today without me practically yelling at him.  Will get patient set back up on CPAP, and will use an automatic device to optimize his pressure.  He is to followup with me in 4 weeks, and we will download his device here in the office.  I have encouraged him to work aggressively on weight loss, and have reminded him of his moral responsibility to not drive if sleepy.  The patient states that he usually does not drive.

## 2011-05-04 NOTE — Progress Notes (Signed)
  Subjective:    Patient ID: [name], male    DOB: 27-Jan-1946, 65 y.o.   MRN: [id number]  HPI The patient is a 65 year old male who I have been asked to see for sleep apnea.  He was diagnosed in 2005 with very severe sleep apnea, with AHI of 91 events per hour.  The patient was started on CPAP and felt he did very well with this, however 6 weeks ago this was taken away from him for unknown reasons by the DME company.  Since he has been off CPAP, he has noticed frequent awakenings at night and nonrestorative sleep.  He has loud snoring, and also witnessed apneas during the night.  The patient has severe daytime sleepiness, and will fall asleep anytime he sits down.  Today, he is unable to stay awake during our visit.  The patient states that he does not drive.  His average score today is 20, and he states that his weight is up 30 pounds over the last 2 years.     Review of Systems  Constitutional: Negative for fever and unexpected weight change.  HENT: Negative for ear pain, nosebleeds, congestion, sore throat, rhinorrhea, sneezing, trouble swallowing, dental problem, postnasal drip and sinus pressure.   Eyes: Negative for redness and itching.  Respiratory: Positive for shortness of breath. Negative for cough (baseline, in the am - prod clear/white), chest tightness and wheezing.   Cardiovascular: Negative for palpitations and leg swelling.  Gastrointestinal: Negative for nausea and vomiting.  Genitourinary: Negative for dysuria.  Musculoskeletal: Positive for joint swelling.  Skin: Negative for rash.  Neurological: Negative for headaches.  Hematological: Does not bruise/bleed easily.  Psychiatric/Behavioral: Negative for dysphoric mood. The patient is not nervous/anxious.        Objective:   Physical Exam Constitutional:  Obese male, no acute distress  HENT:  Nares patent without discharge  Oropharynx without exudate, palate and uvula are very long and thick  Eyes:  Perrla,  eomi, no scleral icterus  Neck:  No JVD, no TMG, large neck  Cardiovascular:  Normal rate, regular rhythm, no rubs or gallops.  No murmurs        Intact distal pulses  Pulmonary :  Normal breath sounds, no stridor or respiratory distress   No rales, rhonchi, or wheezing  Abdominal:  Soft, nondistended, bowel sounds present.  No tenderness noted.   Musculoskeletal:  2+ lower extremity edema noted.  Lymph Nodes:  No cervical lymphadenopathy noted  Skin:  No cyanosis noted  Neurologic:  Unable to stay awake, moves all 4 extremities without obvious deficit.         Assessment & Plan:

## 2011-05-21 LAB — COMPREHENSIVE METABOLIC PANEL
Albumin: 3 g/dL — ABNORMAL LOW (ref 3.5–5.2)
BUN: 15 mg/dL (ref 6–23)
Calcium: 9.3 mg/dL (ref 8.4–10.5)
Glucose, Bld: 347 mg/dL — ABNORMAL HIGH (ref 70–99)
Potassium: 4.1 mEq/L (ref 3.5–5.1)
Sodium: 137 mEq/L (ref 135–145)
Total Protein: 7 g/dL (ref 6.0–8.3)

## 2011-05-21 LAB — CBC
HCT: 43.9 % (ref 39.0–52.0)
Hemoglobin: 14.1 g/dL (ref 13.0–17.0)
MCHC: 32 g/dL (ref 30.0–36.0)
MCV: 82.5 fL (ref 78.0–100.0)
Platelets: 155 10*3/uL (ref 150–400)
RBC: 5.32 MIL/uL (ref 4.22–5.81)
RDW: 13.6 % (ref 11.5–15.5)
WBC: 8.4 K/uL (ref 4.0–10.5)

## 2011-05-21 LAB — DIFFERENTIAL
Basophils Absolute: 0 K/uL (ref 0.0–0.1)
Basophils Relative: 0 % (ref 0–1)
Eosinophils Absolute: 0.2 K/uL (ref 0.0–0.7)
Eosinophils Relative: 2 % (ref 0–5)
Lymphocytes Relative: 23 % (ref 12–46)
Lymphs Abs: 2 10*3/uL (ref 0.7–4.0)
Monocytes Absolute: 0.6 10*3/uL (ref 0.1–1.0)
Monocytes Relative: 7 % (ref 3–12)
Neutro Abs: 5.7 10*3/uL (ref 1.7–7.7)
Neutrophils Relative %: 67 % (ref 43–77)

## 2011-05-21 LAB — COMPREHENSIVE METABOLIC PANEL WITH GFR
ALT: 26 U/L (ref 0–53)
AST: 18 U/L (ref 0–37)
Alkaline Phosphatase: 95 U/L (ref 39–117)
CO2: 33 meq/L — ABNORMAL HIGH (ref 19–32)
Chloride: 97 meq/L (ref 96–112)
Creatinine, Ser: 1.03 mg/dL (ref 0.4–1.5)
GFR calc Af Amer: >60 mL/min (ref >60)
GFR calc non Af Amer: >60 mL/min (ref >60)
Total Bilirubin: 0.2 mg/dL — ABNORMAL LOW (ref 0.3–1.2)

## 2011-05-21 LAB — POCT CARDIAC MARKERS: Myoglobin, poc: 142 ng/mL (ref 12–200)

## 2011-05-21 LAB — B-NATRIURETIC PEPTIDE (CONVERTED LAB): Pro B Natriuretic peptide (BNP): 34 pg/mL (ref 0.0–100.0)

## 2011-06-01 ENCOUNTER — Ambulatory Visit: Payer: PRIVATE HEALTH INSURANCE | Admitting: Pulmonary Disease

## 2011-06-02 ENCOUNTER — Telehealth: Payer: Self-pay | Admitting: Pulmonary Disease

## 2011-06-02 NOTE — Telephone Encounter (Signed)
Called [facility] and scheduled ride for patient on 06-22-11; pt is aware.

## 2011-06-22 ENCOUNTER — Encounter: Payer: Self-pay | Admitting: Pulmonary Disease

## 2011-06-22 ENCOUNTER — Ambulatory Visit (INDEPENDENT_AMBULATORY_CARE_PROVIDER_SITE_OTHER): Payer: PRIVATE HEALTH INSURANCE | Admitting: Pulmonary Disease

## 2011-06-22 VITALS — BP 136/64 | HR 86 | Temp 97.7°F | Ht 72.0 in | Wt 335.6 lb

## 2011-06-22 DIAGNOSIS — G4733 Obstructive sleep apnea (adult) (pediatric): Secondary | ICD-10-CM

## 2011-06-22 NOTE — Progress Notes (Signed)
  Subjective:    Patient ID: [name], male    DOB: [birth year], 65 y.o.   MRN: [id number]  HPI The patient comes in today for followup of his known severe obstructive sleep apnea.  At the last visit, the patient was restarted on CPAP with automatic mode, and asked to bring his machine to the visit today for download.  Unfortunately he did not bring his machine, but states that he has been wearing CPAP compliant way.  He denies any issues with the mask fit or CPAP pressure.  He does feel that his sleep is improved, but he continues to have some degree of daytime sleepiness.   Review of Systems  Constitutional: Negative for fever and unexpected weight change.  HENT: Positive for congestion, rhinorrhea, sneezing and sinus pressure. Negative for ear pain, nosebleeds, sore throat, trouble swallowing, dental problem and postnasal drip.   Eyes: Negative for redness and itching.  Respiratory: Positive for cough, chest tightness, shortness of breath and wheezing.   Cardiovascular: Positive for leg swelling. Negative for palpitations.  Gastrointestinal: Negative for nausea and vomiting.  Genitourinary: Positive for dysuria.  Musculoskeletal: Positive for joint swelling.  Skin: Negative for rash.  Neurological: Negative for headaches.  Hematological: Bruises/bleeds easily.  Psychiatric/Behavioral: Negative for dysphoric mood. The patient is not nervous/anxious.        Objective:   Physical Exam Morbidly obese male in no acute distress No skin breakdown or pressure necrosis from the CPAP mask Lower extremities with mild edema, no cyanosis noted Awake, but does appear somewhat sleepy, moves all 4 extremities.  Is not as sleepy as last visit.       Assessment & Plan:

## 2011-06-22 NOTE — Assessment & Plan Note (Signed)
The patient feels that he is doing better with CPAP, however did not bring his machine in for download as I had asked.  He currently is on the automatic setting, and we will need to get a download in order to find his optimal pressure.  I would also like to check his compliance data.  I have asked him to continue on the CPAP, to keep up with supplies and mask changes, and to followup with me in 6 months.  I have also encouraged him to work aggressively on weight loss.

## 2011-06-22 NOTE — Patient Instructions (Signed)
Will have advanced get a download off your machine, and I will let you know your optimal pressure. Work on weight loss, keep up with mask changes and supplies. followup with me in 55mos.

## 2011-07-02 ENCOUNTER — Telehealth: Payer: Self-pay | Admitting: Dietician

## 2011-07-02 NOTE — Telephone Encounter (Signed)
Called patient due un scheduled referral for Medical Nutrition Therapy.   Patient says he is now with [facility] and he is not sure he needs to come to [place] anymore because they have doctors who he sees. Note P[facility] no [facility] doctor.  Will ask social work about [facility]. Question if they have a nutrition team/dietitian on staff. patient requests call back with this infornation as well.

## 2011-07-06 NOTE — Telephone Encounter (Signed)
Spoke with social work, patient doctor needs met by [facility] [employer]. Called patient back at his request and left message regarding no need for appointment with our office.

## 2011-07-22 IMAGING — CR DG ABDOMEN 1V
2 series · 2 of 2 positions shown · non-contrast
Comparison: None

CLINICAL DATA: Assess for foreign body; mistakenly ate four
thumbtacks.

ABDOMEN - 1 VIEW

[w abdomen upright * (1 of 2)]
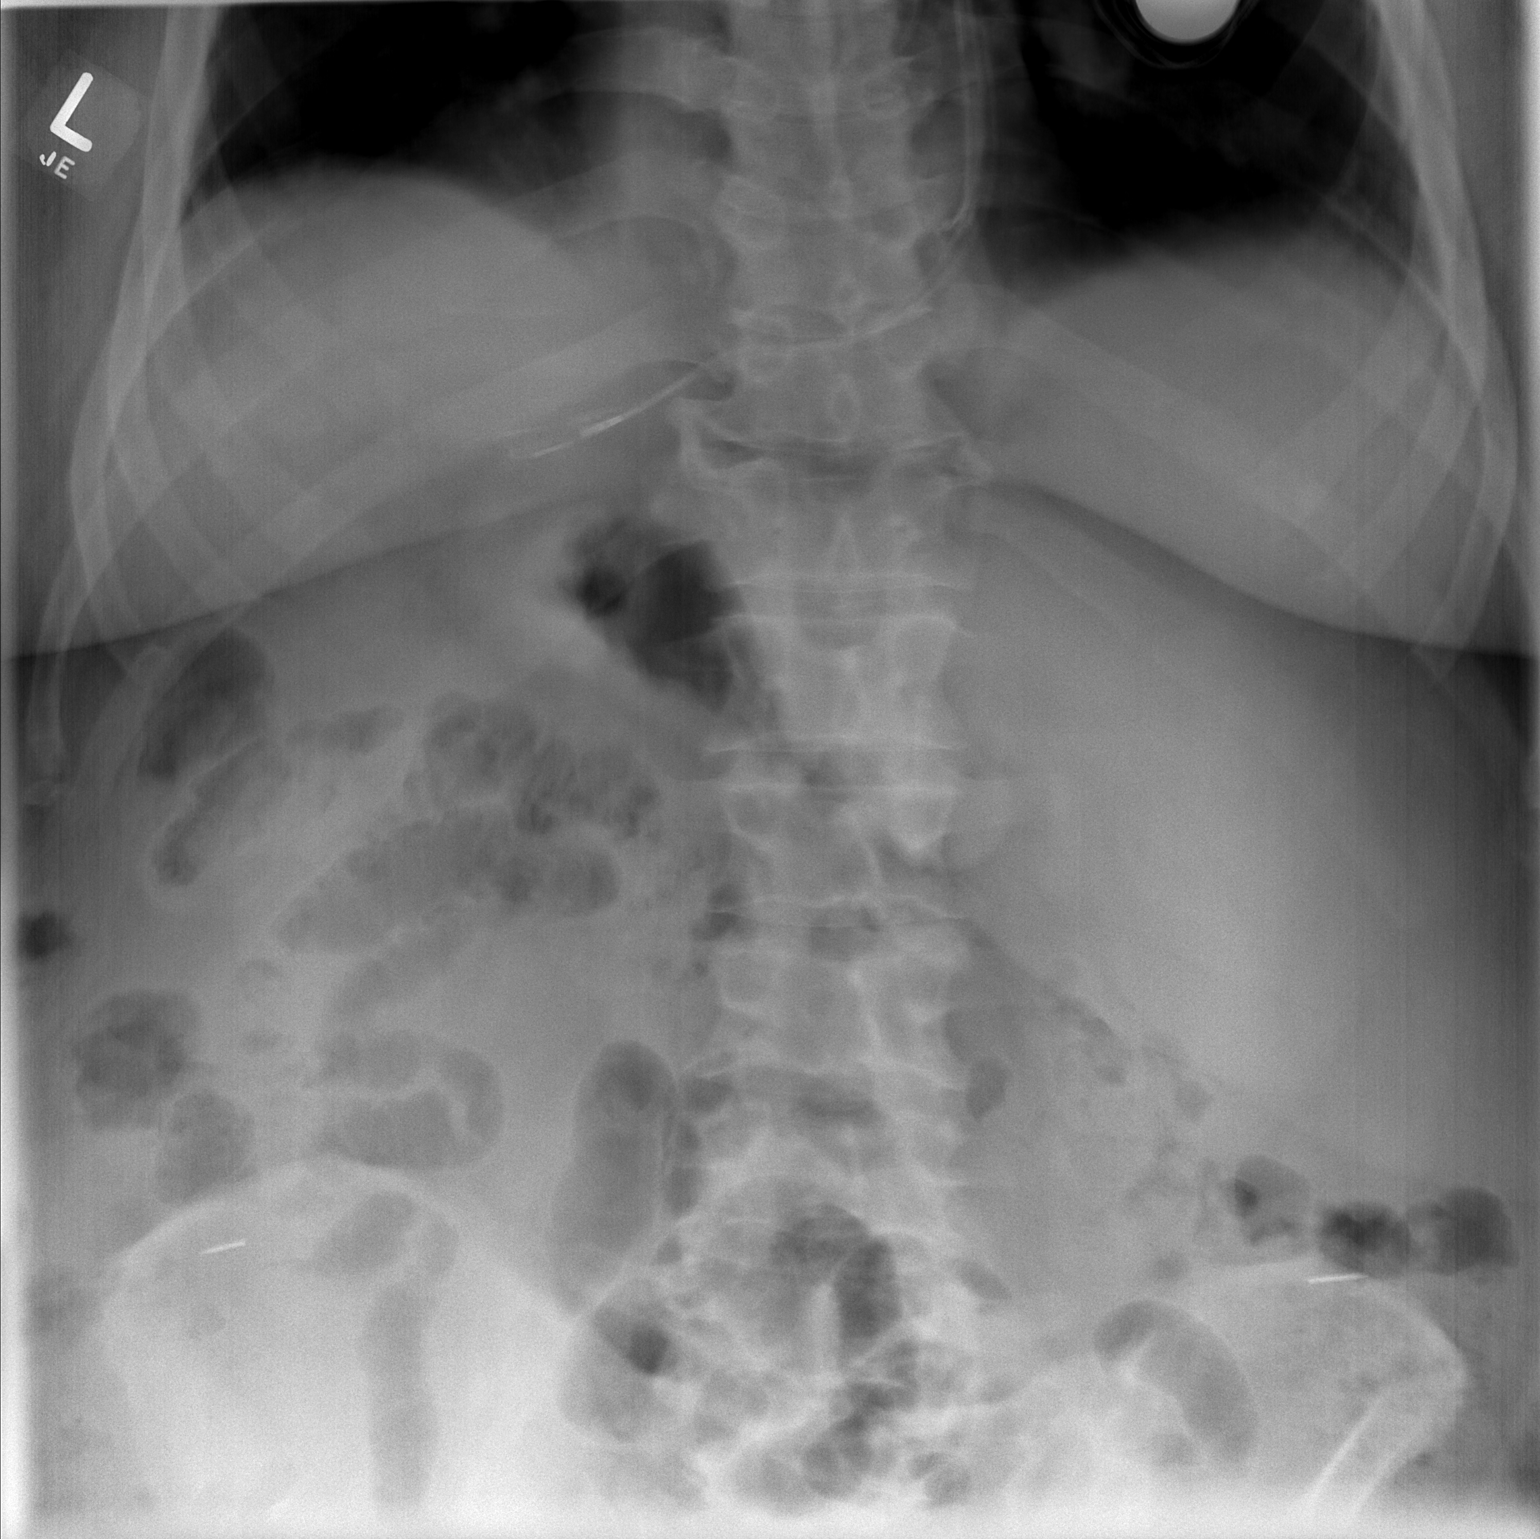

[w abdomen upright * (2 of 2)]
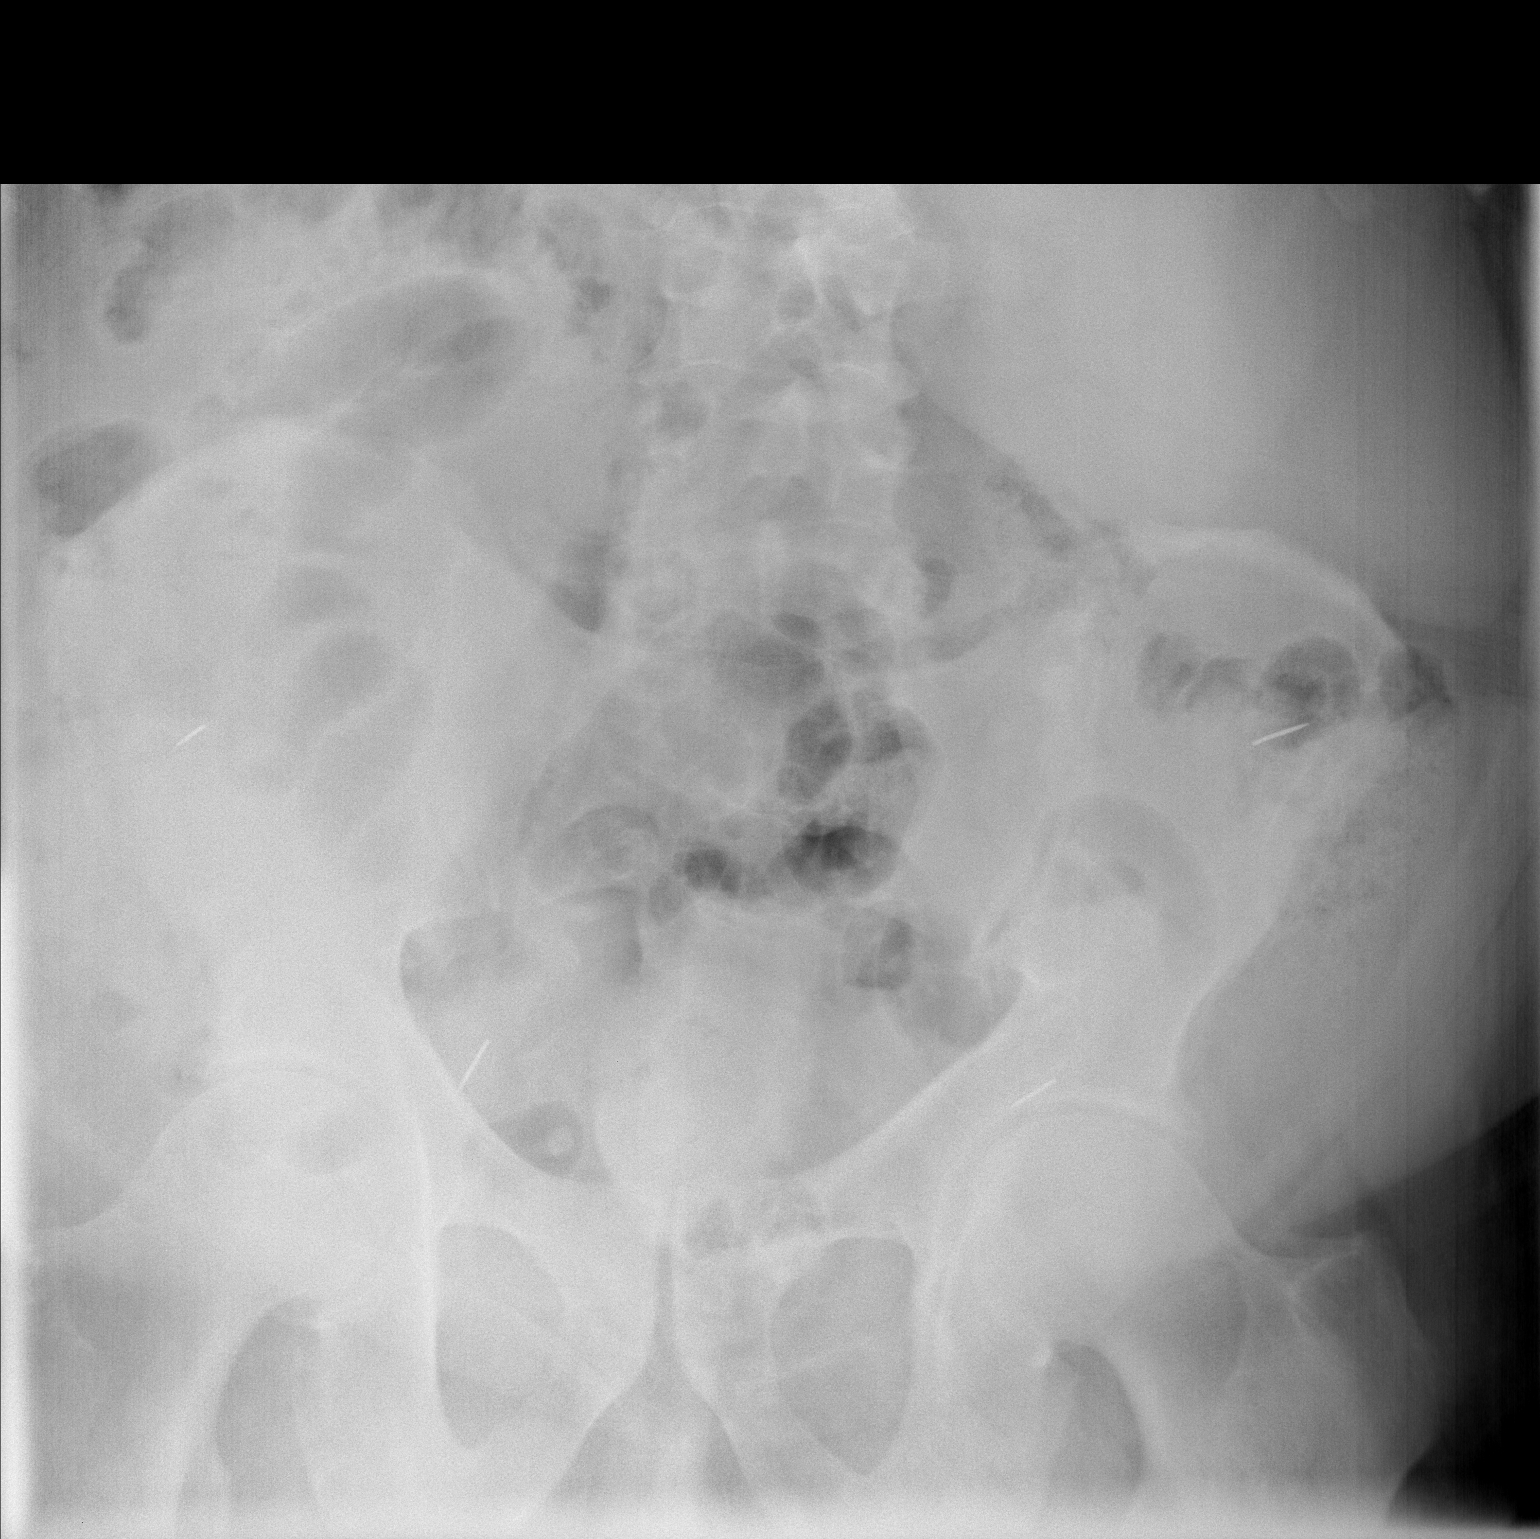

[2 of 2 positions shown; findings below may reference images not displayed]

FINDINGS: The radiopaque thumbtacks are identified scattered about
the lower abdomen.  One of the thumbtacks likely resides within the
ascending colon, while a second is noted overlying the cecum.  A
third thumbtack in the left hemipelvis may reside within a small
bowel loop or the proximal sigmoid colon, while a fourth thumbtack
on the left side of the abdomen may reside within the descending
colon or a small bowel loop.

The visualized bowel gas pattern is unremarkable.  No free intra-
abdominal air is seen on the upright view.  A nasogastric tube is
noted ending in the fundus of the stomach.

No acute osseous abnormalities are seen.
IMPRESSION: Radiopaque thumbtacks noted scattered about the lower abdomen,
likely within small and large bowel loops as described above.

## 2011-07-22 IMAGING — CR DG CHEST 1V
1 series · 1 of 1 positions shown · non-contrast
Comparison: Chest radiograph performed 08/21/2008

CLINICAL DATA: Assess for foreign body; mistakenly ate four
thumbtacks.

CHEST - 1 VIEW

[w chest pa *]
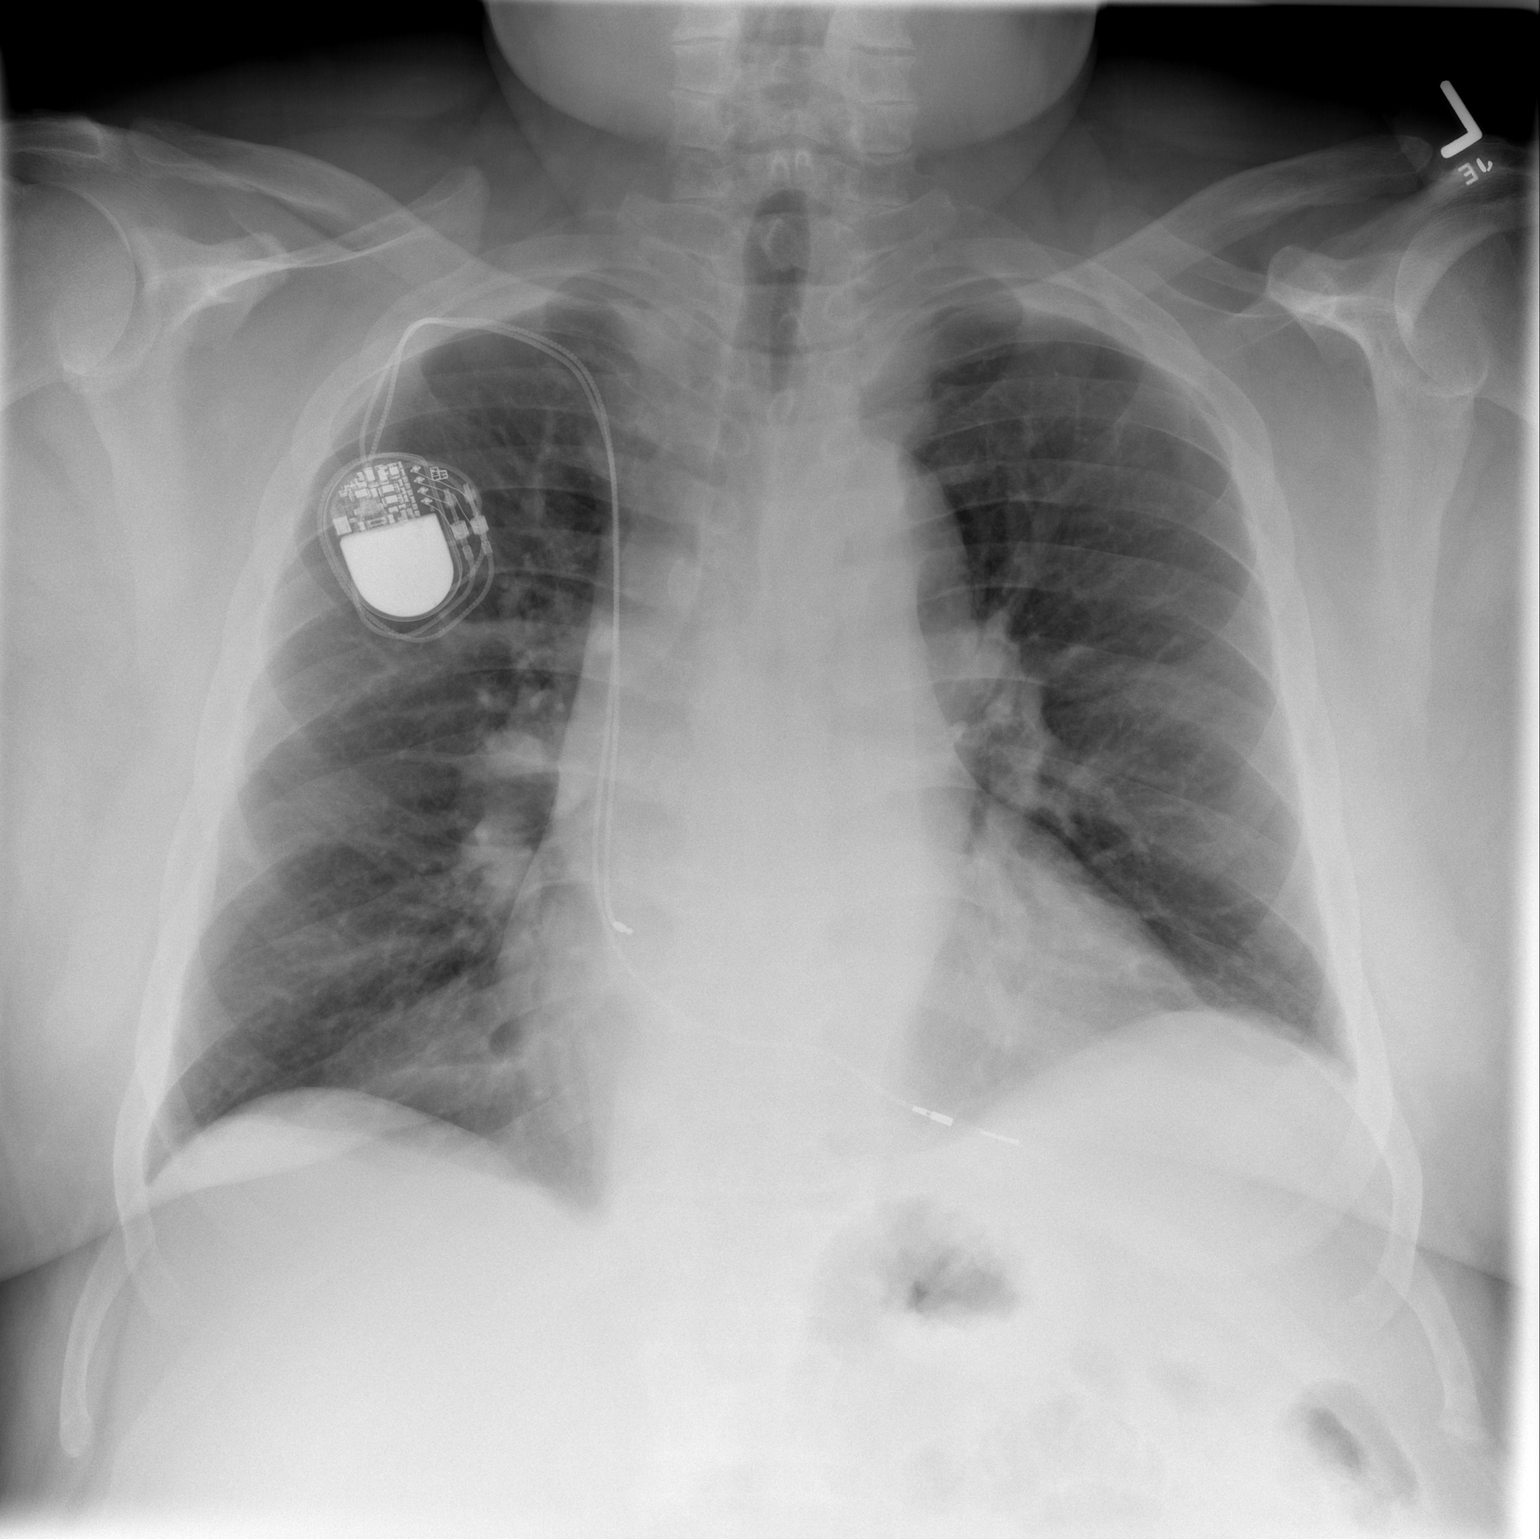

[1 of 1 positions shown; findings below may reference images not displayed]

FINDINGS: The radiopaque thumbtacks are identified on the
concurrent radiographs of the abdomen.  No radiopaque foreign
bodies are identified within the chest.

The lungs are well-aerated and clear.  There is no evidence of
focal opacification, pleural effusion or pneumothorax.

The cardiomediastinal silhouette is mildly enlarged; a pacemaker is
noted overlying the right chest wall, with leads ending overlying
the right atrium and right ventricle.  There is mild stable
prominence of the central pulmonary vasculature.  No acute osseous
abnormalities are seen.
IMPRESSION: 1.  Radiopaque thumbtacks identified on the concurrent radiographs
of the abdomen; no radiopaque foreign objects seen within the
chest.
2.  Stable cardiomegaly with venous congestion but no evidence of
interstitial edema.

## 2011-09-12 ENCOUNTER — Other Ambulatory Visit: Payer: Self-pay | Admitting: Pulmonary Disease

## 2011-09-12 DIAGNOSIS — G4733 Obstructive sleep apnea (adult) (pediatric): Secondary | ICD-10-CM

## 2011-09-23 ENCOUNTER — Other Ambulatory Visit: Payer: Self-pay | Admitting: Family Medicine

## 2011-09-23 ENCOUNTER — Ambulatory Visit
Admission: RE | Admit: 2011-09-23 | Discharge: 2011-09-23 | Disposition: A | Discharge status: Home / Self Care | Payer: PRIVATE HEALTH INSURANCE | Source: Ambulatory Visit | Attending: Family Medicine | Admitting: Family Medicine

## 2011-09-23 DIAGNOSIS — R0602 Shortness of breath: Secondary | ICD-10-CM

## 2011-12-21 ENCOUNTER — Encounter: Payer: Self-pay | Admitting: Pulmonary Disease

## 2011-12-21 ENCOUNTER — Ambulatory Visit (INDEPENDENT_AMBULATORY_CARE_PROVIDER_SITE_OTHER): Payer: Self-pay | Admitting: Pulmonary Disease

## 2011-12-21 VITALS — BP 100/64 | HR 75 | Temp 97.7°F | Ht 72.0 in | Wt 314.6 lb

## 2011-12-21 DIAGNOSIS — G4733 Obstructive sleep apnea (adult) (pediatric): Secondary | ICD-10-CM

## 2011-12-21 NOTE — Assessment & Plan Note (Signed)
The patient is doing well with CPAP, but most recently has not worn because of a toothache.  He will get back on the device once this is taken care of.  I have commended him on his weight loss since last visit, and asked him to continue.  I've also encouraged him to keep up with the mask changes and supplies.

## 2011-12-21 NOTE — Patient Instructions (Signed)
Stay on cpap, and keep working on weight loss.  You are doing well. followup with me in one year.

## 2011-12-21 NOTE — Progress Notes (Signed)
  Subjective:    Patient ID: [name], male    DOB: 06-29-1946, 66 y.o.   MRN: [id number]  HPI The patient comes in today for followup of his known obstructive sleep apnea.  He is wearing CPAP compliantly until he developed a toothache recently, and will restart once he gets the tooth taken care of at the end of the week.  He feels he has responded well to treatment, and has seen improvement in his sleep and daytime alertness.  He has lost over 20 pounds since the last visit here.  He denies any issues with mask fit or pressure.   Review of Systems  Constitutional: Negative for fever and unexpected weight change.  HENT: Positive for dental problem. Negative for ear pain, nosebleeds, congestion, sore throat, rhinorrhea, sneezing, trouble swallowing, postnasal drip and sinus pressure.   Eyes: Negative for redness and itching.  Respiratory: Positive for shortness of breath. Negative for cough, chest tightness and wheezing.   Cardiovascular: Negative for palpitations and leg swelling.  Gastrointestinal: Negative for nausea and vomiting.  Genitourinary: Negative for dysuria.  Musculoskeletal: Negative for joint swelling.  Skin: Negative for rash.  Neurological: Negative for headaches.  Hematological: Does not bruise/bleed easily.  Psychiatric/Behavioral: Negative for dysphoric mood. The patient is not nervous/anxious.        Objective:   Physical Exam Morbidly obese male in no acute distress No skin breakdown or pressure necrosis from the CPAP mask Lower extremities with edema noted, no cyanosis Mildly sleepy, but communicative, moves all 4 extremities.       Assessment & Plan:

## 2012-02-28 ENCOUNTER — Other Ambulatory Visit: Payer: Self-pay | Admitting: Family Medicine

## 2012-02-28 DIAGNOSIS — M79606 Pain in leg, unspecified: Secondary | ICD-10-CM

## 2012-03-06 ENCOUNTER — Ambulatory Visit
Admission: RE | Admit: 2012-03-06 | Discharge: 2012-03-06 | Disposition: A | Discharge status: Home / Self Care | Payer: No Typology Code available for payment source | Source: Ambulatory Visit | Attending: Family Medicine | Admitting: Family Medicine

## 2012-03-06 DIAGNOSIS — M79606 Pain in leg, unspecified: Secondary | ICD-10-CM

## 2012-05-16 NOTE — Progress Notes (Signed)
This encounter was created in error - please disregard.

## 2012-10-03 ENCOUNTER — Emergency Department (HOSPITAL_COMMUNITY): Payer: Medicare (Managed Care)

## 2012-10-03 ENCOUNTER — Encounter (HOSPITAL_COMMUNITY): Payer: Self-pay | Admitting: Emergency Medicine

## 2012-10-03 ENCOUNTER — Emergency Department (HOSPITAL_COMMUNITY)
Admission: EM | Admit: 2012-10-03 | Discharge: 2012-10-03 | Disposition: A | Discharge status: Home / Self Care | Payer: Medicare (Managed Care) | Attending: Emergency Medicine | Admitting: Emergency Medicine

## 2012-10-03 DIAGNOSIS — Z9981 Dependence on supplemental oxygen: Secondary | ICD-10-CM | POA: Insufficient documentation

## 2012-10-03 DIAGNOSIS — Z87448 Personal history of other diseases of urinary system: Secondary | ICD-10-CM | POA: Insufficient documentation

## 2012-10-03 DIAGNOSIS — Z8619 Personal history of other infectious and parasitic diseases: Secondary | ICD-10-CM | POA: Insufficient documentation

## 2012-10-03 DIAGNOSIS — I428 Other cardiomyopathies: Secondary | ICD-10-CM | POA: Insufficient documentation

## 2012-10-03 DIAGNOSIS — Z87891 Personal history of nicotine dependence: Secondary | ICD-10-CM | POA: Insufficient documentation

## 2012-10-03 DIAGNOSIS — Z8719 Personal history of other diseases of the digestive system: Secondary | ICD-10-CM | POA: Insufficient documentation

## 2012-10-03 DIAGNOSIS — Z794 Long term (current) use of insulin: Secondary | ICD-10-CM | POA: Insufficient documentation

## 2012-10-03 DIAGNOSIS — L97109 Non-pressure chronic ulcer of unspecified thigh with unspecified severity: Secondary | ICD-10-CM | POA: Insufficient documentation

## 2012-10-03 DIAGNOSIS — Z7982 Long term (current) use of aspirin: Secondary | ICD-10-CM | POA: Insufficient documentation

## 2012-10-03 DIAGNOSIS — I2510 Atherosclerotic heart disease of native coronary artery without angina pectoris: Secondary | ICD-10-CM | POA: Insufficient documentation

## 2012-10-03 DIAGNOSIS — G4733 Obstructive sleep apnea (adult) (pediatric): Secondary | ICD-10-CM | POA: Insufficient documentation

## 2012-10-03 DIAGNOSIS — Z8739 Personal history of other diseases of the musculoskeletal system and connective tissue: Secondary | ICD-10-CM | POA: Insufficient documentation

## 2012-10-03 DIAGNOSIS — R42 Dizziness and giddiness: Secondary | ICD-10-CM | POA: Insufficient documentation

## 2012-10-03 DIAGNOSIS — Z950 Presence of cardiac pacemaker: Secondary | ICD-10-CM | POA: Insufficient documentation

## 2012-10-03 DIAGNOSIS — Z79899 Other long term (current) drug therapy: Secondary | ICD-10-CM | POA: Insufficient documentation

## 2012-10-03 DIAGNOSIS — R5381 Other malaise: Secondary | ICD-10-CM | POA: Insufficient documentation

## 2012-10-03 DIAGNOSIS — E1169 Type 2 diabetes mellitus with other specified complication: Secondary | ICD-10-CM | POA: Insufficient documentation

## 2012-10-03 DIAGNOSIS — E785 Hyperlipidemia, unspecified: Secondary | ICD-10-CM | POA: Insufficient documentation

## 2012-10-03 DIAGNOSIS — Z9861 Coronary angioplasty status: Secondary | ICD-10-CM | POA: Insufficient documentation

## 2012-10-03 DIAGNOSIS — R55 Syncope and collapse: Secondary | ICD-10-CM

## 2012-10-03 DIAGNOSIS — I959 Hypotension, unspecified: Secondary | ICD-10-CM | POA: Insufficient documentation

## 2012-10-03 DIAGNOSIS — H548 Legal blindness, as defined in USA: Secondary | ICD-10-CM | POA: Insufficient documentation

## 2012-10-03 DIAGNOSIS — I119 Hypertensive heart disease without heart failure: Secondary | ICD-10-CM | POA: Insufficient documentation

## 2012-10-03 DIAGNOSIS — M109 Gout, unspecified: Secondary | ICD-10-CM | POA: Insufficient documentation

## 2012-10-03 LAB — CBC
HCT: 42.6 % (ref 39.0–52.0)
MCHC: 32.2 g/dL (ref 30.0–36.0)
MCV: 79 fL (ref 78.0–100.0)
RDW: 15.1 % (ref 11.5–15.5)
WBC: 14.3 10*3/uL — ABNORMAL HIGH (ref 4.0–10.5)

## 2012-10-03 LAB — BASIC METABOLIC PANEL
BUN: 36 mg/dL — ABNORMAL HIGH (ref 6–23)
Chloride: 103 mEq/L (ref 96–112)
Creatinine, Ser: 1.94 mg/dL — ABNORMAL HIGH (ref 0.50–1.35)
GFR calc Af Amer: 40 mL/min — ABNORMAL LOW (ref >90)

## 2012-10-03 MED ORDER — SODIUM CHLORIDE 0.9 % IV BOLUS (SEPSIS)
1000.0000 mL | Freq: Once | INTRAVENOUS | Status: AC
  Administered 2012-10-03: 1000 mL via INTRAVENOUS

## 2012-10-03 NOTE — ED Notes (Signed)
Discharge instructions reviewed. Pt verbalized understanding.  

## 2012-10-03 NOTE — ED Notes (Signed)
Pt returned from xray

## 2012-10-03 NOTE — ED Notes (Signed)
Per EMS pt was brought in from [facility] after being found by staff in steam room because pt was confused and had been in the steam room for 60mins and they were concerned. Pt c/o weakness, tired, pain to rt hand due to gout, and neck (not new). Pt has 20g in LAC with NS infusion. Pt has pacemaker was going in and out of NSR and 1st degree heart block . Pt doesn't have SOB or CP.

## 2012-10-03 NOTE — ED Provider Notes (Signed)
Medical screening examination/treatment/procedure(s) were conducted as a shared visit with non-physician practitioner(s) and myself.  I personally evaluated the patient during the encounter.  Patient with near syncopal episode after working out and spending time in steam room. Has a pacemaker, interigation reveals that the battery needs to be changed, but no arrythmia. Patient feeling better. Discussed with PCP, will see in AM. Will need to contact cardiologist to arrange for pacer replacement.  Orpah Greek, MD 10/03/12 [phone]

## 2012-10-03 NOTE — ED Provider Notes (Signed)
History     CSN: [id number]  Arrival date & time 10/03/12  1529   First MD Initiated Contact with Patient 10/03/12 1530      Chief Complaint  Patient presents with  . Hypotension  . Weakness    (Consider location/radiation/quality/duration/timing/severity/associated sxs/prior treatment) HPI Comments: 67 year old male with a history of diabetes, gout, hyperlipidemia, hypertension and multiple other comorbidities brought in to the emergency department via EMS from [facility] due to hypotension. Patient was sitting in the steam room for about 30 minutes when staff went into the steam room and found him an air appearing confused. He took his blood pressure and will apparently was low, so they called EMS. Once EMS arrived, blood pressure 80/50.patient states at the time he was feeling weak, tired and lightheaded. States he sits in the steam room on a regular basis. He did not exercise prior to getting in the steam room. He drinks a lot of water throughout the day. Earlier today he was feeling fine and took his daily medications as prescribed. States the lightheadedness worsened upon standing. There have been no changes in his medications. Patient is also having a flare-up of his gout in his right hand, however he is taking colchicine at home to manage this problem. Denies LOC, chest pain, sob, nausea, vomiting, recent illness.  Patient is a 67 y.o. male presenting with weakness. The history is provided by the patient and the EMS personnel.  Weakness Associated symptoms include weakness. Pertinent negatives include no abdominal pain, chest pain, nausea, neck pain or vomiting.    Past Medical History  Diagnosis Date  . Diabetes type 2, uncontrolled   . Gout   . Hyperlipidemia   . Hypertension   . Obstructive sleep apnea     On CPAP. Last sleep study (06/2009) - Severe obstructive sleep apnea/hypopnea syndrome, apnea-hypopnea index 70.5 per hour with non positional events moderately loud  snoring and oxygen desaturation to a nadir of 85% on room air.  . Benign prostatic hypertrophy   . Morbid obesity   . Cardiomyopathy, hypertensive      diastolic dysfunction by [id number] echo  . Coronary artery disease      nonobstructive. 2 vessel. cardiac catheter in March 2005 showing 60% LAD occlusion, 30% circumflex occlusion.  . 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 Legally blind   . Herpes   . Occult blood positive stool      History of in August 2007.  colonoscopy in January 2008 showing normal colon with fair inadequate prep. By Dr. Silvano Rusk.  . Degenerative joint disease      Right knee.  . Incarcerated ventral hernia     H/O. S/P repair ventral hernia repair 07/2005.  . Diabetic ulcer of thigh     H/O Rt thigh ulcer.    Past Surgical History  Procedure Laterality Date  . Pacemaker insertion  10/2003    Secondary to symptomatic bradycardia. DDDR pacemaker. By Dr. Rollene Fare.  . Pacemaker lead revision  12/2003     likely microdislodgment  of right ventricular lead  . Ventral hernia repair  07/2005     S/P laparotomy, lysis of adhesions, repair of incarcerated ventral hernia with  partial omentectomy.. Performed by Dr. Marlou Starks.    Family History  Problem Relation Age of Onset  . Coronary artery disease Sister   . Heart disease Sister   . Allergies Sister   . Asthma Brother   . Rheum arthritis Brother  History  Substance Use Topics  . Smoking status: Former Smoker -- 0.10 packs/day for 33 years    Types: Cigarettes    Quit date: 08/17/2003  . Smokeless tobacco: Not on file     Comment: started at age 89.  only smoked on occ- very rarely.  . Alcohol Use: No      Review of Systems  Constitutional: Positive for activity change.  HENT: Negative for neck pain and neck stiffness.   Respiratory: Negative for shortness of breath.   Cardiovascular: Negative for chest pain.  Gastrointestinal: Negative for nausea, vomiting and abdominal pain.   Neurological: Positive for weakness and light-headedness.  Psychiatric/Behavioral: Negative for confusion.  All other systems reviewed and are negative.    Allergies  Review of patient's allergies indicates no known allergies.  Home Medications   Current Outpatient Rx  Name  Route  Sig  Dispense  Refill  . albuterol-ipratropium (COMBIVENT) 18-103 MCG/ACT inhaler   Inhalation   Inhale 2 puffs into the lungs every 6 (six) hours as needed. Once to two puffs every 6 hours as needed for shortness of breath.          Marland Kitchen amLODipine (NORVASC) 10 MG tablet   Oral   Take 10 mg by mouth daily.          Marland Kitchen aspirin 81 MG tablet   Oral   Take 81 mg by mouth daily.          Marland Kitchen atorvastatin (LIPITOR) 40 MG tablet   Oral   Take 40 mg by mouth daily.           . carvedilol (COREG) 25 MG tablet   Oral   Take 25 mg by mouth 2 (two) times daily with a meal.           . cloNIDine (CATAPRES) 0.3 MG tablet   Oral   Take 0.3 mg by mouth 2 (two) times daily.           Marland Kitchen EXPIRED: fluticasone (FLOVENT HFA) 220 MCG/ACT inhaler   Inhalation   Inhale 1 puff into the lungs 2 (two) times daily.   1 Inhaler   12   . furosemide (LASIX) 40 MG tablet   Oral   Take 40 mg by mouth 2 (two) times daily.           Marland Kitchen glucose blood test strip   Other   1 each by Other route as needed. Use as instructed to check blood sugar before meals and at bedtime at least 2-3 time daily          . HUMALOG MIX 75/25 KWIKPEN 75-25 % SUSP   Subcutaneous   Inject 60 Units into the skin 2 (two) times daily.         . Insulin Pen Needle 31G X 8 MM MISC      Use to inject insulin 2x daily before breakfast and dinner.         Marland Kitchen lisinopril (PRINIVIL,ZESTRIL) 20 MG tablet   Oral   Take 20 mg by mouth daily.           . metolazone (ZAROXOLYN) 2.5 MG tablet   Oral   Take 2.5 mg by mouth every other day.          . naproxen (NAPROSYN) 500 MG tablet   Oral   Take 500 mg by mouth 2 (two) times  daily.         Marland Kitchen omeprazole (PRILOSEC OTC) 20 MG  tablet   Oral   Take 20 mg by mouth daily.           . potassium chloride SA (K-DUR,KLOR-CON) 20 MEQ tablet   Oral   Take 1 tablet by mouth Daily.           BP 107/77  Pulse 65  Temp(Src) 97.8 F (36.6 C) (Oral)  Resp 27  SpO2 99%  Physical Exam  Nursing note and vitals reviewed. Constitutional: He is oriented to person, place, and time. He appears well-developed. No distress. Nasal cannula in place.  Morbidly obese  HENT:  Head: Normocephalic and atraumatic.  Mouth/Throat: Oropharynx is clear and moist.  Eyes: Conjunctivae are normal.  Neck: Normal range of motion. Neck supple. No JVD present.  Cardiovascular: Normal rate, regular rhythm and normal heart sounds.   Pulses:      Dorsalis pedis pulses are 1+ on the right side, and 1+ on the left side.       Posterior tibial pulses are 1+ on the right side, and 1+ on the left side.  No extremity edema.  Pulmonary/Chest: Tachypnea noted. No respiratory distress. He has no decreased breath sounds. He has no wheezes. He has no rhonchi. He has no rales.  Abdominal: Soft. Bowel sounds are normal. There is no tenderness.  Musculoskeletal: Normal range of motion. He exhibits no edema.  Neurological: He is alert and oriented to person, place, and time.  Skin: Skin is warm and dry. No pallor.  Psychiatric: He has a normal mood and affect. His behavior is normal.    ED Course  Procedures (including critical care time)  Labs Reviewed  CBC - Abnormal; Notable for the following:    WBC 14.3 (*)    MCH 25.4 (*)    All other components within normal limits  BASIC METABOLIC PANEL - Abnormal; Notable for the following:    BUN 36 (*)    Creatinine, Ser 1.94 (*)    GFR calc non Af Amer 35 (*)    GFR calc Af Amer 40 (*)    All other components within normal limits   Date: 10/03/2012  Rate: 66  Rhythm: normal sinus rhythm  QRS Axis: normal  Intervals: PR prolonged  ST/T Wave  abnormalities: nonspecific T wave changes  Conduction Disutrbances:first-degree A-V block   Narrative Interpretation: sinus thythm, first degree AV block, anterior infarct, age indeterminate, abnormal T waves, consider ischemia, inferior leads  Old EKG Reviewed: none available   Dg Chest 2 View  10/03/2012  *RADIOLOGY REPORT*  Clinical Data: Hypertension, weakness  CHEST - 2 VIEW  Comparison: Chest x-ray of 09/23/2011  Findings:  The lungs are hyperaerated consistent with emphysema with flattened hemidiaphragms.  No focal infiltrate or effusion is seen.  Cardiomegaly is stable.  A permanent pacemaker remains.  No skeletal abnormality is noted.  IMPRESSION: Emphysema. Moderate cardiomegaly with pacer.  No active lung disease.   Original Report Authenticated By: Ivar Drape, M.D.      1. Hypotension   2. Syncope       MDM  NP at Dr. Etheleen Sia office spoke with Dr. Betsey Holiday regarding patient and will see him in the office tomorrow. Patient was also due to have battery change in pacemaker back in November. Patient aware. He will f/u with cardiologist to discuss this issue. After receiving fluids he is feeling much better. No longer lightheaded or dizzy. He is not orthostatic. Vitals stable. Stable for discharge. Return precautions discussed. Patient states understanding of plan and is  agreeable. Case discussed with Dr. Betsey Holiday who also evaluated patient and agrees with plan of care.         Illene Labrador, PA-C 10/03/12 Vernelle Emerald

## 2012-10-03 NOTE — ED Notes (Signed)
Patient transported to X-ray 

## 2012-10-14 HISTORY — PX: INSERT / REPLACE / REMOVE PACEMAKER: SUR710

## 2012-11-01 ENCOUNTER — Other Ambulatory Visit (HOSPITAL_COMMUNITY): Payer: Self-pay | Admitting: Cardiovascular Disease

## 2012-11-01 DIAGNOSIS — I872 Venous insufficiency (chronic) (peripheral): Secondary | ICD-10-CM

## 2012-11-01 DIAGNOSIS — Z950 Presence of cardiac pacemaker: Secondary | ICD-10-CM

## 2012-11-01 DIAGNOSIS — I350 Nonrheumatic aortic (valve) stenosis: Secondary | ICD-10-CM

## 2012-11-03 ENCOUNTER — Encounter (HOSPITAL_COMMUNITY)
Admission: RE | Disposition: A | Discharge status: Home / Self Care | Payer: Self-pay | Source: Ambulatory Visit | Attending: Cardiovascular Disease

## 2012-11-03 ENCOUNTER — Encounter (HOSPITAL_COMMUNITY): Payer: Self-pay

## 2012-11-03 ENCOUNTER — Ambulatory Visit (HOSPITAL_COMMUNITY)
Admission: RE | Admit: 2012-11-03 | Discharge: 2012-11-03 | Disposition: A | Discharge status: Home / Self Care | Payer: Medicare (Managed Care) | Source: Ambulatory Visit | Attending: Cardiovascular Disease | Admitting: Cardiovascular Disease

## 2012-11-03 ENCOUNTER — Ambulatory Visit (HOSPITAL_COMMUNITY): Admit: 2012-11-03 | Payer: Self-pay | Admitting: Cardiovascular Disease

## 2012-11-03 DIAGNOSIS — Z794 Long term (current) use of insulin: Secondary | ICD-10-CM | POA: Insufficient documentation

## 2012-11-03 DIAGNOSIS — I359 Nonrheumatic aortic valve disorder, unspecified: Secondary | ICD-10-CM | POA: Insufficient documentation

## 2012-11-03 DIAGNOSIS — I517 Cardiomegaly: Secondary | ICD-10-CM | POA: Insufficient documentation

## 2012-11-03 DIAGNOSIS — M109 Gout, unspecified: Secondary | ICD-10-CM | POA: Insufficient documentation

## 2012-11-03 DIAGNOSIS — I498 Other specified cardiac arrhythmias: Secondary | ICD-10-CM | POA: Insufficient documentation

## 2012-11-03 DIAGNOSIS — Z45018 Encounter for adjustment and management of other part of cardiac pacemaker: Secondary | ICD-10-CM | POA: Insufficient documentation

## 2012-11-03 DIAGNOSIS — Z79899 Other long term (current) drug therapy: Secondary | ICD-10-CM | POA: Insufficient documentation

## 2012-11-03 DIAGNOSIS — I2510 Atherosclerotic heart disease of native coronary artery without angina pectoris: Secondary | ICD-10-CM | POA: Insufficient documentation

## 2012-11-03 DIAGNOSIS — G4730 Sleep apnea, unspecified: Secondary | ICD-10-CM | POA: Insufficient documentation

## 2012-11-03 DIAGNOSIS — Z6841 Body Mass Index (BMI) 40.0 and over, adult: Secondary | ICD-10-CM | POA: Insufficient documentation

## 2012-11-03 DIAGNOSIS — Z87891 Personal history of nicotine dependence: Secondary | ICD-10-CM | POA: Insufficient documentation

## 2012-11-03 DIAGNOSIS — J45909 Unspecified asthma, uncomplicated: Secondary | ICD-10-CM | POA: Insufficient documentation

## 2012-11-03 DIAGNOSIS — Z7982 Long term (current) use of aspirin: Secondary | ICD-10-CM | POA: Insufficient documentation

## 2012-11-03 DIAGNOSIS — E119 Type 2 diabetes mellitus without complications: Secondary | ICD-10-CM | POA: Insufficient documentation

## 2012-11-03 DIAGNOSIS — E785 Hyperlipidemia, unspecified: Secondary | ICD-10-CM | POA: Insufficient documentation

## 2012-11-03 DIAGNOSIS — I872 Venous insufficiency (chronic) (peripheral): Secondary | ICD-10-CM | POA: Insufficient documentation

## 2012-11-03 HISTORY — PX: PERMANENT PACEMAKER GENERATOR CHANGE: SHX6022

## 2012-11-03 LAB — SURGICAL PCR SCREEN: Staphylococcus aureus: NEGATIVE

## 2012-11-03 SURGERY — PERMANENT PACEMAKER GENERATOR CHANGE
Anesthesia: LOCAL

## 2012-11-03 MED ORDER — SODIUM CHLORIDE 0.9 % IR SOLN
80.0000 mg | Status: DC
  Filled 2012-11-03: qty 2

## 2012-11-03 MED ORDER — DEXTROSE 5 % IV SOLN
3.0000 g | INTRAVENOUS | Status: DC
  Filled 2012-11-03 (×2): qty 3000

## 2012-11-03 MED ORDER — SODIUM CHLORIDE 0.9 % IJ SOLN: 3.0000 mL | INTRAMUSCULAR | Status: DC | PRN

## 2012-11-03 MED ORDER — LIDOCAINE HCL (PF) 1 % IJ SOLN
INTRAMUSCULAR | Status: AC
  Filled 2012-11-03: qty 60

## 2012-11-03 MED ORDER — CHLORHEXIDINE GLUCONATE 4 % EX LIQD
60.0000 mL | Freq: Once | CUTANEOUS | Status: DC
  Filled 2012-11-03: qty 60

## 2012-11-03 MED ORDER — SODIUM CHLORIDE 0.9 % IV SOLN: INTRAVENOUS | Status: DC

## 2012-11-03 MED ORDER — FENTANYL CITRATE 0.05 MG/ML IJ SOLN
INTRAMUSCULAR | Status: AC
  Filled 2012-11-03: qty 2

## 2012-11-03 MED ORDER — MUPIROCIN 2 % EX OINT: TOPICAL_OINTMENT | Freq: Two times a day (BID) | CUTANEOUS | Status: DC

## 2012-11-03 MED ORDER — MIDAZOLAM HCL 5 MG/5ML IJ SOLN
INTRAMUSCULAR | Status: AC
  Filled 2012-11-03: qty 5

## 2012-11-03 MED ORDER — MUPIROCIN 2 % EX OINT
TOPICAL_OINTMENT | CUTANEOUS | Status: AC
  Filled 2012-11-03: qty 22

## 2012-11-03 NOTE — H&P (Signed)
Date of Initial H&P: Dr. Rollene Fare 10/31/12  History reviewed, patient examined, no change in status, stable for surgery. Dual chamber pacemaker at [facility], here for generator change, This procedure has been fully reviewed with the patient and written informed consent has been obtained.  Sanda Klein, MD, [place] [phone] office [phone] pager 11/03/2012

## 2012-11-03 NOTE — CV Procedure (Signed)
Procedure report  Procedure performed:  1. Dual chamber pacemaker generator changeout  2. Light sedation  Reason for procedure:  1. Device generator at elective replacement interval  Procedure performed by:  Sanda Klein, MD  Complications:  None  Estimated blood loss:  <5 mL  Medications administered during procedure:  Ancef 3 g intravenously, lidocaine 1% 30 mL locally, fentanyl 50 mcg intravenously, Versed 2 mg intravenously  Device details:  New Generator Medtronic Adapta model number ADDRL1, serial number [id number]  Right atrial lead (chronic) Medtronic, model number [id number], serial [id number] (implanted 10/15/2003)  Right ventricular lead (chronic) Medtronic, model number [id number], serial number [id number] (implanted 10/24/2003)  Explanted generator Medtronic EnPulse, model number [id number], serial number [id number] (implanted 10/24/2003)  Procedure details:  After the risks and benefits of the procedure were discussed the patient provided informed consent. He was brought to the cardiac catheter lab in the fasting state. The patient was prepped and draped in usual sterile fashion. Local anesthesia with 1% lidocaine was administered to to the left infraclavicular area. A 5-6cm horizontal incision was made parallel with and 2-3 cm caudal to the clavicle, in the area of an old scar. An older scar was seen closer to the left clavicle. Using minimal electrocautery and mostly sharp and blunt dissection the prepectoral pocket was opened carefully to avoid injury to the loops of chronic leads. Extensive dissection was notnecessary. The device was explanted. The pocket was carefully inspected for hemostasis and flushed with copious amounts of antibiotic solution.  The leads were disconnected from the old generator and testing of the lead parameters showed excellent values. The new generator was connected to the chronic leads, with appropriate pacing noted.  The entire system was then carefully  inserted in the pocket with care been taking that the leads and device assumed a comfortable position without pressure on the incision. Great care was taken that the leads be located deep to the generator. The pocket was then closed in layers using 2 layers of 2-0 Vicryl and cutaneous staples after which a sterile dressing was applied.  At the end of the procedure the following lead parameters were encountered:  Right atrial lead sensed P waves 5.4 mV, impedance 380 ohms, threshold 0.3 at 0.5 ms pulse width.  Right ventricular lead sensed R waves 17.9 mV, impedance 681 ohms, threshold 1.3 at 0.5 ms pulse width.  Sanda Klein, MD, [place]  [phone] office  [phone] pager  11/03/2012  1:52 PM  cc: Delfino Lovett A. Rollene Fare, MD

## 2012-11-03 NOTE — Brief Op Note (Signed)
Procedure report  Procedure performed:  1. Dual chamber pacemaker generator changeout  2. Light sedation  Reason for procedure:  1. Device generator at elective replacement interval  Procedure performed by:  Sanda Klein, MD  Complications:  None  Estimated blood loss:  <5 mL  Medications administered during procedure:  Ancef 3 g intravenously, lidocaine 1% 30 mL locally, fentanyl 50 mcg intravenously, Versed 2 mg intravenously Device details:   New Generator Medtronic Adapta model number ADDRL1, serial number [id number] Right atrial lead (chronic) Medtronic, model number [id number], serial [id number] (implanted 10/15/2003) Right ventricular lead (chronic)  Medtronic, model number [id number], serial number [id number] (implanted 10/24/2003)  Explanted generator Medtronic EnPulse,  model number [id number], serial number  [id number] (implanted 10/24/2003)  Procedure details:  After the risks and benefits of the procedure were discussed the patient provided informed consent. He was brought to the cardiac catheter lab in the fasting state. The patient was prepped and draped in usual sterile fashion. Local anesthesia with 1% lidocaine was administered to to the left infraclavicular area. A 5-6cm horizontal incision was made parallel with and 2-3 cm caudal to the  clavicle, in the area of an old scar. An older scar was seen closer to the left clavicle. Using minimal electrocautery and mostly sharp and blunt dissection the prepectoral pocket was opened carefully to avoid injury to the loops of chronic leads. Extensive dissection was notnecessary. The device was explanted. The pocket was carefully inspected for hemostasis and flushed with copious amounts of antibiotic solution.  The leads were disconnected from the old generator and testing of the lead parameters showed excellent values. The new generator was connected to the chronic leads, with appropriate pacing noted.   The entire system was then  carefully inserted in the pocket with care been taking that the leads and device assumed a comfortable position without pressure on the incision. Great care was taken that the leads be located deep to the generator. The pocket was then closed in layers using 2 layers of 2-0 Vicryl and cutaneous staples after which a sterile dressing was applied.   At the end of the procedure the following lead parameters were encountered:   Right atrial lead sensed P waves 5.4 mV, impedance 380 ohms, threshold 0.3 at 0.5 ms pulse width.  Right ventricular lead sensed R waves  17.9 mV, impedance 681 ohms, threshold 1.3 at 0.5 ms pulse width.  Sanda Klein, MD, [place] [phone] office [phone] pager 11/03/2012 1:52 PM  cc: Delfino Lovett A. Rollene Fare, MD

## 2012-11-14 ENCOUNTER — Inpatient Hospital Stay (HOSPITAL_COMMUNITY): Admission: RE | Admit: 2012-11-14 | Payer: Self-pay | Source: Ambulatory Visit

## 2012-11-14 ENCOUNTER — Encounter (HOSPITAL_COMMUNITY): Payer: Self-pay

## 2012-12-14 ENCOUNTER — Other Ambulatory Visit (HOSPITAL_COMMUNITY): Payer: Self-pay | Admitting: Cardiovascular Disease

## 2012-12-14 DIAGNOSIS — I350 Nonrheumatic aortic (valve) stenosis: Secondary | ICD-10-CM

## 2012-12-14 DIAGNOSIS — I872 Venous insufficiency (chronic) (peripheral): Secondary | ICD-10-CM

## 2012-12-19 ENCOUNTER — Ambulatory Visit (HOSPITAL_COMMUNITY): Payer: Medicare (Managed Care)

## 2012-12-20 ENCOUNTER — Encounter: Payer: Self-pay | Admitting: Pulmonary Disease

## 2012-12-20 ENCOUNTER — Ambulatory Visit (INDEPENDENT_AMBULATORY_CARE_PROVIDER_SITE_OTHER): Payer: Medicare (Managed Care) | Admitting: Pulmonary Disease

## 2012-12-20 VITALS — BP 160/100 | HR 83 | Temp 97.0°F | Ht 72.0 in | Wt 284.4 lb

## 2012-12-20 DIAGNOSIS — G4733 Obstructive sleep apnea (adult) (pediatric): Secondary | ICD-10-CM

## 2012-12-20 NOTE — Patient Instructions (Addendum)
Try and wear cpap as much as possible Keep up with mask changes and supplies.  Will send an order to [facility] to help you with this. followup with me in one year.

## 2012-12-20 NOTE — Progress Notes (Signed)
  Subjective:    Patient ID: [name], male    DOB: 25-Apr-1946, 67 y.o.   MRN: [id number]  HPI The patient comes in today for followup of his obstructive sleep apnea.  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  He has been working with the CPAP as much as he can, and feels that it does help his sleep whenever he wears the device.  He has no significant mask leak or issues with pressure.   Review of Systems  Constitutional: Negative for fever and unexpected weight change.  HENT: Negative for ear pain, nosebleeds, congestion, sore throat, rhinorrhea, sneezing, trouble swallowing, dental problem, postnasal drip and sinus pressure.   Eyes: Negative for redness and itching.  Respiratory: Negative for cough, chest tightness, shortness of breath and wheezing.   Cardiovascular: Negative for palpitations and leg swelling.  Gastrointestinal: Negative for nausea and vomiting.  Genitourinary: Negative for dysuria.  Musculoskeletal: Negative for joint swelling.  Skin: Negative for rash.  Neurological: Negative for headaches.  Hematological: Does not bruise/bleed easily.  Psychiatric/Behavioral: Negative for dysphoric mood. The patient is not nervous/anxious.        Objective:   Physical Exam Overweight male in no acute distress Nose without purulence or discharge noted No skin breakdown or pressure necrosis from the CPAP mask. Neck without lymphadenopathy or thyromegaly Lower extremities with mild edema, no cyanosis Alert and oriented, moves all 4 extremities.       Assessment & Plan:

## 2012-12-20 NOTE — Assessment & Plan Note (Signed)
The patient is tolerating CPAP well, but has not been wearing nightly because of ongoing issues with his eyes.  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  I have asked him to try and wear her CPAP every night if possible, and he will definitely work on this.  He will probably need some assistance now that he has lost his vision.  I have asked him to keep up with his mask changes as well as supplies, and to work on weight loss.

## 2013-01-01 ENCOUNTER — Ambulatory Visit (HOSPITAL_COMMUNITY)
Admission: RE | Admit: 2013-01-01 | Discharge: 2013-01-01 | Disposition: A | Discharge status: Home / Self Care | Payer: Medicare (Managed Care) | Source: Ambulatory Visit | Attending: Cardiovascular Disease | Admitting: Cardiovascular Disease

## 2013-01-01 ENCOUNTER — Other Ambulatory Visit: Payer: Self-pay | Admitting: *Deleted

## 2013-01-01 ENCOUNTER — Encounter (HOSPITAL_COMMUNITY): Payer: Medicare (Managed Care)

## 2013-01-01 DIAGNOSIS — E119 Type 2 diabetes mellitus without complications: Secondary | ICD-10-CM | POA: Insufficient documentation

## 2013-01-01 DIAGNOSIS — I350 Nonrheumatic aortic (valve) stenosis: Secondary | ICD-10-CM

## 2013-01-01 DIAGNOSIS — I079 Rheumatic tricuspid valve disease, unspecified: Secondary | ICD-10-CM | POA: Insufficient documentation

## 2013-01-01 DIAGNOSIS — I359 Nonrheumatic aortic valve disorder, unspecified: Secondary | ICD-10-CM | POA: Insufficient documentation

## 2013-01-01 DIAGNOSIS — I517 Cardiomegaly: Secondary | ICD-10-CM | POA: Insufficient documentation

## 2013-01-01 DIAGNOSIS — I059 Rheumatic mitral valve disease, unspecified: Secondary | ICD-10-CM | POA: Insufficient documentation

## 2013-01-01 HISTORY — PX: TRANSTHORACIC ECHOCARDIOGRAM: SHX275

## 2013-01-01 LAB — PACEMAKER DEVICE OBSERVATION

## 2013-01-01 NOTE — Progress Notes (Signed)
2D Echo Performed 01/01/2013    Marygrace Drought, RCS

## 2013-01-02 ENCOUNTER — Encounter: Payer: Self-pay | Admitting: Cardiovascular Disease

## 2013-01-02 ENCOUNTER — Encounter (HOSPITAL_COMMUNITY): Payer: Medicare (Managed Care)

## 2013-01-04 ENCOUNTER — Encounter (HOSPITAL_COMMUNITY): Payer: Medicare (Managed Care)

## 2013-01-17 ENCOUNTER — Ambulatory Visit (HOSPITAL_COMMUNITY)
Admission: RE | Admit: 2013-01-17 | Discharge: 2013-01-17 | Disposition: A | Discharge status: Home / Self Care | Payer: Medicare (Managed Care) | Source: Ambulatory Visit | Attending: Cardiovascular Disease | Admitting: Cardiovascular Disease

## 2013-01-17 DIAGNOSIS — I872 Venous insufficiency (chronic) (peripheral): Secondary | ICD-10-CM | POA: Insufficient documentation

## 2013-01-17 DIAGNOSIS — I359 Nonrheumatic aortic valve disorder, unspecified: Secondary | ICD-10-CM | POA: Insufficient documentation

## 2013-01-17 DIAGNOSIS — I350 Nonrheumatic aortic (valve) stenosis: Secondary | ICD-10-CM

## 2013-01-17 HISTORY — PX: OTHER SURGICAL HISTORY: SHX169

## 2013-01-17 NOTE — Progress Notes (Signed)
Venous Duplex Completed. [name]

## 2013-01-31 ENCOUNTER — Telehealth: Payer: Self-pay | Admitting: Cardiovascular Disease

## 2013-01-31 NOTE — Telephone Encounter (Signed)
Wants to know what was decided about his BP-wants to change medicine!

## 2013-01-31 NOTE — Telephone Encounter (Addendum)
[name], medical records, checking to see if fax received.  No fax received.  Returned call and asked [name] to refax to nurses' station.  Agreed to refax now.  Will notify Dr. Rollene Fare when fax received.

## 2013-01-31 NOTE — Telephone Encounter (Signed)
Information was faxed yesterday to Dr. Sylvester Harder about Mr.Agramonte blood pressure

## 2013-01-31 NOTE — Telephone Encounter (Signed)
Audrea Muscat states that information was faxed to Dr. Dennie Fetters yesterday about [name]' blood pressure.  She is doing follow up.

## 2013-01-31 NOTE — Telephone Encounter (Signed)
Talked with bryan hager and we discussed pt. Past chronic renal failure and hypertensive problems. Maryann informed to increase pt.s Amlodipine to 10mg  daily. And to continue monitoring pt.s bp.Marland Kitchen

## 2013-01-31 NOTE — Telephone Encounter (Signed)
Call to [facility] and informed fax received.  Chart w/ fax placed on cart for review.

## 2013-02-22 ENCOUNTER — Other Ambulatory Visit: Payer: Self-pay

## 2013-02-23 ENCOUNTER — Telehealth: Payer: Self-pay | Admitting: *Deleted

## 2013-02-23 NOTE — Telephone Encounter (Signed)
Maryann w/ [facility] called.  Stated pt's BP is poorly controlled.  Stated they have gotten him low and pt c/o dizziness and weakness.  Stated BP was 181/118 today and has been around that range since June 18th.  Stated they were told that Dr. Rollene Fare will manage his BP and wanted to know what he would like to do so they can work as a team to manage BP.  Maryann asked to fax note to Dr. Rollene Fare to review and sign orders.  Verbalized understanding and agreed w/ plan.  See current BP meds below.  Amlodipine 10mg  daily Carvedilol 25mg  BID Lasix 40mg  BID Metolazone 2.5mg  twice weekly  Stated pt has been on clonidine in the past and didn't know if Dr. Rollene Fare wants to try that, but they will do whatever he decides.  Fax received.  Message forwarded to [facility]. Tressie Stalker, LPN to discuss w/ Dr. Rollene Fare.  This note and paper chart# [id number] placed on Dr. Lowella Fairy cart.

## 2013-02-23 NOTE — Telephone Encounter (Signed)
Dr. Rollene Fare advised pt start Lisinopril 20mg  daily and Clonidine 0.2mg  daily per Dominica Severin, LPN.  Faxed instructions back to [facility].  Called her back and left voicemail w/ instructions.

## 2013-02-27 NOTE — Telephone Encounter (Signed)
Orders signed and returned

## 2013-03-15 ENCOUNTER — Other Ambulatory Visit: Payer: Self-pay | Admitting: Family Medicine

## 2013-03-15 DIAGNOSIS — R52 Pain, unspecified: Secondary | ICD-10-CM

## 2013-03-19 ENCOUNTER — Other Ambulatory Visit: Payer: Medicare Other

## 2013-03-20 ENCOUNTER — Ambulatory Visit
Admission: RE | Admit: 2013-03-20 | Discharge: 2013-03-20 | Disposition: A | Discharge status: Home / Self Care | Payer: Medicare (Managed Care) | Source: Ambulatory Visit | Attending: Family Medicine | Admitting: Family Medicine

## 2013-03-20 DIAGNOSIS — R52 Pain, unspecified: Secondary | ICD-10-CM

## 2013-04-23 ENCOUNTER — Telehealth: Payer: Self-pay | Admitting: Cardiovascular Disease

## 2013-04-23 ENCOUNTER — Other Ambulatory Visit: Payer: Self-pay | Admitting: Cardiovascular Disease

## 2013-04-23 ENCOUNTER — Other Ambulatory Visit: Payer: Self-pay | Admitting: *Deleted

## 2013-04-23 DIAGNOSIS — E119 Type 2 diabetes mellitus without complications: Secondary | ICD-10-CM

## 2013-04-23 LAB — PACEMAKER DEVICE OBSERVATION

## 2013-04-23 LAB — COMPREHENSIVE METABOLIC PANEL
ALT: 123 U/L — ABNORMAL HIGH (ref 0–53)
AST: 54 U/L — ABNORMAL HIGH (ref 0–37)
Albumin: 3.7 g/dL (ref 3.5–5.2)
Alkaline Phosphatase: 103 U/L (ref 39–117)
BUN: 54 mg/dL — ABNORMAL HIGH (ref 6–23)
Calcium: 9.3 mg/dL (ref 8.4–10.5)
Chloride: 97 mEq/L (ref 96–112)
Potassium: 4.3 mEq/L (ref 3.5–5.3)

## 2013-04-23 LAB — CBC WITH DIFFERENTIAL/PLATELET
Basophils Absolute: 0 10*3/uL (ref 0.0–0.1)
Basophils Relative: 0 % (ref 0–1)
Lymphocytes Relative: 34 % (ref 12–46)
MCHC: 33.7 g/dL (ref 30.0–36.0)
Neutro Abs: 5.4 10*3/uL (ref 1.7–7.7)
Neutrophils Relative %: 54 % (ref 43–77)
Platelets: 233 10*3/uL (ref 150–400)
RDW: 16 % — ABNORMAL HIGH (ref 11.5–15.5)
WBC: 9.9 10*3/uL (ref 4.0–10.5)

## 2013-04-23 NOTE — Telephone Encounter (Signed)
Called NP back and explained reason why pt. Was put on amiodarone and coumadin

## 2013-04-23 NOTE — Telephone Encounter (Signed)
Message forwarded to J.C. Wildman, LPN.  

## 2013-04-23 NOTE — Telephone Encounter (Signed)
Wants to know why he was started on Coumadin and Amiodorone?-What is his diagnosis?

## 2013-04-25 ENCOUNTER — Other Ambulatory Visit: Payer: Self-pay | Admitting: *Deleted

## 2013-04-26 ENCOUNTER — Encounter (HOSPITAL_BASED_OUTPATIENT_CLINIC_OR_DEPARTMENT_OTHER): Payer: Medicare (Managed Care) | Attending: Internal Medicine

## 2013-04-26 DIAGNOSIS — E1169 Type 2 diabetes mellitus with other specified complication: Secondary | ICD-10-CM | POA: Insufficient documentation

## 2013-04-26 DIAGNOSIS — L97409 Non-pressure chronic ulcer of unspecified heel and midfoot with unspecified severity: Secondary | ICD-10-CM | POA: Insufficient documentation

## 2013-04-26 LAB — GLUCOSE, CAPILLARY: Glucose-Capillary: 144 mg/dL — ABNORMAL HIGH (ref 70–99)

## 2013-04-27 ENCOUNTER — Inpatient Hospital Stay (HOSPITAL_COMMUNITY): Admission: RE | Admit: 2013-04-27 | Payer: Medicare (Managed Care) | Source: Ambulatory Visit

## 2013-04-27 ENCOUNTER — Ambulatory Visit: Payer: Medicare (Managed Care) | Admitting: Pharmacist Clinician (PhC)/ Clinical Pharmacy Specialist

## 2013-04-27 NOTE — Progress Notes (Signed)
[facility]  NAME:  [name], [name] NO.:  [id number]  MEDICAL RECORD NO.:  [id number]      DATE OF BIRTH:  11-08-1945  PHYSICIAN:  Ricard Dillon, M.D. VISIT DATE:  04/26/2013                                  OFFICE VISIT   LOCATION:  [place].  HISTORY:  [name] is a 67 year old man who is a type 2 diabetic, [facility] for a wound on his plantar, left foot, roughly over the large area involving his left fifth metatarsal head. The patient states that this began about 3-4 weeks ago as a blister.  He is reasonably insensate in the area.  The blister recently opened.  He is here for our review of this. He has been on Antibiotics  PAST MEDICAL HISTORY:  Includes: 1. CHF. 2. Hypertension. 3. Coronary artery disease. 4. Hyperlipidemia. 5. Chronic venous insufficiency. 6. Type 2 diabetes with diabetic neuropathy. 7. Stage III chronic renal failure. 8. Macular degeneration from which she is legally blind. 9. Gastroesophageal reflux disease. 10.COPD. 11.Morbid obesity. 12.Degenerative joint disease. 13.Narcolepsy.  PAST SURGICAL HISTORY: 1. Pacemaker. 2. Hernia repair.  MEDICATION LIST:  Reviewed.  He is on: 1. Alphagan ophthalmic. 2. Amiodarone 200 daily. 3. ASA 81 daily. 4. Byetta 10 mg 2 times a day. 5. Carvedilol 25 b.i.d. 6. Clonidine 0.2 b.i.d. 7. Colcrys 0.6 b.i.d. 8. Crestor 40 daily. 9. Lasix 40 two times daily. 10.Imdur 30 daily. 11.Lantus insulin 40 units at bedtime. 12.Lisinopril 20 daily. 13.Magnesium oxide 400 a day. 14.Metolazone 2.5 two times a week.  PHYSICAL EXAMINATION:  VITAL SIGNS:  Temperature is 98.4, pulse 40, respirations 17, blood pressure 143/75.  Blood glucose was 144.  VASCULAR ASSESSMENT:  His ABI is calculated in this clinic on the left was 0.9.  WOUND EXAM:  The area in question was on the bottom of his left plantar foot encompassing the fifth  metatarsal head, and areas superior and inferior to this.  This was a previous blister that has ruptured.  The skin on top of this was nonviable.  It was removed with scissors and pickups.  He tolerated this well.  The surface of the blister appears to be stable.  There was no overt infection here.  IMPRESSION:  Diabetic foot ulcer in the setting of type 2 diabetes.  The blister itself was fully excised.  Apparently, previously this was felt to have a bacterial super infection, treated with Augmentin and Cipro. He is insensate in the setting of diabetes and probably very severe diabetic neuropathy.  I do not currently believe that the area is infected.  We applied silver alginate, Kerlix, and net to this area.  Put him in a healing sandal.  He pretty appeared to tolerate the healing sandal well. The silver alginate can be changed 3 times a week.  I believe this can be done at the [facility] itself.  We will see him again in a week's time.          ______________________________ Ricard Dillon, M.D.     MGR/MEDQ  D:  04/26/2013  T:  04/27/2013  Job:  [id number]

## 2013-05-01 ENCOUNTER — Telehealth (HOSPITAL_COMMUNITY): Payer: Self-pay | Admitting: *Deleted

## 2013-05-01 NOTE — Telephone Encounter (Signed)
I called to schedule patient for his LEA, however [facility] is not authorizing this test and cancelled his appt

## 2013-05-10 ENCOUNTER — Other Ambulatory Visit (HOSPITAL_BASED_OUTPATIENT_CLINIC_OR_DEPARTMENT_OTHER): Payer: Self-pay | Admitting: Internal Medicine

## 2013-05-10 ENCOUNTER — Ambulatory Visit (HOSPITAL_COMMUNITY)
Admission: RE | Admit: 2013-05-10 | Discharge: 2013-05-10 | Disposition: A | Discharge status: Home / Self Care | Payer: Medicare (Managed Care) | Source: Ambulatory Visit | Attending: Internal Medicine | Admitting: Internal Medicine

## 2013-05-10 ENCOUNTER — Encounter: Payer: Self-pay | Admitting: Cardiovascular Disease

## 2013-05-10 DIAGNOSIS — M249 Joint derangement, unspecified: Secondary | ICD-10-CM | POA: Insufficient documentation

## 2013-05-10 DIAGNOSIS — S91309A Unspecified open wound, unspecified foot, initial encounter: Secondary | ICD-10-CM | POA: Diagnosis not present

## 2013-05-10 DIAGNOSIS — M7730 Calcaneal spur, unspecified foot: Secondary | ICD-10-CM | POA: Insufficient documentation

## 2013-05-10 DIAGNOSIS — M24176 Other articular cartilage disorders, unspecified foot: Secondary | ICD-10-CM | POA: Insufficient documentation

## 2013-05-10 DIAGNOSIS — M869 Osteomyelitis, unspecified: Secondary | ICD-10-CM

## 2013-05-10 DIAGNOSIS — X58XXXA Exposure to other specified factors, initial encounter: Secondary | ICD-10-CM | POA: Insufficient documentation

## 2013-05-10 DIAGNOSIS — S8990XA Unspecified injury of unspecified lower leg, initial encounter: Secondary | ICD-10-CM | POA: Insufficient documentation

## 2013-05-11 ENCOUNTER — Encounter (HOSPITAL_COMMUNITY): Payer: Medicare (Managed Care)

## 2013-05-17 ENCOUNTER — Encounter (HOSPITAL_BASED_OUTPATIENT_CLINIC_OR_DEPARTMENT_OTHER): Payer: Medicare (Managed Care) | Attending: Internal Medicine

## 2013-05-17 DIAGNOSIS — E1169 Type 2 diabetes mellitus with other specified complication: Secondary | ICD-10-CM | POA: Insufficient documentation

## 2013-05-17 DIAGNOSIS — L97509 Non-pressure chronic ulcer of other part of unspecified foot with unspecified severity: Secondary | ICD-10-CM | POA: Insufficient documentation

## 2013-05-17 LAB — GLUCOSE, CAPILLARY

## 2013-05-31 LAB — GLUCOSE, CAPILLARY

## 2013-06-07 ENCOUNTER — Telehealth: Payer: Self-pay | Admitting: Cardiovascular Disease

## 2013-06-07 LAB — GLUCOSE, CAPILLARY: Glucose-Capillary: 139 mg/dL — ABNORMAL HIGH (ref 70–99)

## 2013-06-07 NOTE — Telephone Encounter (Signed)
Medication question  Please call  . Talk to [facility] directly  Not to voice mail

## 2013-06-08 ENCOUNTER — Other Ambulatory Visit: Payer: Self-pay | Admitting: Family Medicine

## 2013-06-08 DIAGNOSIS — R609 Edema, unspecified: Secondary | ICD-10-CM

## 2013-06-11 ENCOUNTER — Ambulatory Visit
Admission: RE | Admit: 2013-06-11 | Discharge: 2013-06-11 | Disposition: A | Discharge status: Home / Self Care | Payer: No Typology Code available for payment source | Source: Ambulatory Visit | Attending: Family Medicine | Admitting: Family Medicine

## 2013-06-11 DIAGNOSIS — M869 Osteomyelitis, unspecified: Secondary | ICD-10-CM

## 2013-06-11 MED ORDER — IOHEXOL 300 MG/ML  SOLN
75.0000 mL | Freq: Once | INTRAMUSCULAR | Status: AC | PRN
  Administered 2013-06-11: 75 mL via INTRAVENOUS

## 2013-06-13 ENCOUNTER — Ambulatory Visit (INDEPENDENT_AMBULATORY_CARE_PROVIDER_SITE_OTHER): Payer: Medicare (Managed Care) | Admitting: Cardiovascular Disease

## 2013-06-13 ENCOUNTER — Encounter: Payer: Self-pay | Admitting: Cardiovascular Disease

## 2013-06-13 VITALS — BP 138/62 | HR 72 | Resp 20 | Ht 72.0 in | Wt 302.0 lb

## 2013-06-13 DIAGNOSIS — I4891 Unspecified atrial fibrillation: Secondary | ICD-10-CM

## 2013-06-13 DIAGNOSIS — I498 Other specified cardiac arrhythmias: Secondary | ICD-10-CM

## 2013-06-13 DIAGNOSIS — I119 Hypertensive heart disease without heart failure: Secondary | ICD-10-CM

## 2013-06-13 DIAGNOSIS — I428 Other cardiomyopathies: Secondary | ICD-10-CM

## 2013-06-13 DIAGNOSIS — Z950 Presence of cardiac pacemaker: Secondary | ICD-10-CM

## 2013-06-13 DIAGNOSIS — I2510 Atherosclerotic heart disease of native coronary artery without angina pectoris: Secondary | ICD-10-CM

## 2013-06-13 LAB — PACEMAKER DEVICE OBSERVATION
AL AMPLITUDE: 2 mv
AL IMPEDENCE PM: 453 Ohm
RV LEAD AMPLITUDE: 11.2 mv
RV LEAD IMPEDENCE PM: 850 Ohm
RV LEAD THRESHOLD: 1.25 V
RV LEAD THRESHOLD: 1.75 V

## 2013-06-13 NOTE — Patient Instructions (Addendum)
Your physician recommends that you schedule a follow-up appointment in: 3 months with pacemaker check   Take 2 Aspirin 81 mg daily.

## 2013-06-18 ENCOUNTER — Encounter (HOSPITAL_COMMUNITY): Payer: Self-pay | Admitting: Pharmacy Technician

## 2013-06-21 ENCOUNTER — Encounter (HOSPITAL_COMMUNITY): Payer: Self-pay | Admitting: *Deleted

## 2013-06-21 ENCOUNTER — Encounter (HOSPITAL_BASED_OUTPATIENT_CLINIC_OR_DEPARTMENT_OTHER): Payer: Medicare (Managed Care) | Attending: Internal Medicine

## 2013-06-21 ENCOUNTER — Other Ambulatory Visit (HOSPITAL_COMMUNITY): Payer: Self-pay | Admitting: Orthopedic Surgery

## 2013-06-21 NOTE — Progress Notes (Signed)
Patient is legally blind, [facility] prepares his medications, pt cant separate which meds to take in am.  A [facility] driver is bringing patient to the the hospital in the am.  I called and left a message for Audrea Muscat, patient's nurse and asked her to call me back to see if they can prepare medications for patient to take in am.  I instructed patient to use eye drops and to not take Insulin or diabetic medication in am.  Pt repeated these instructions and to not eat anything after midnight.

## 2013-06-21 NOTE — Progress Notes (Signed)
I did not receive a call back from Audrea Muscat.  I called patient again and instructed him to not take any medications in am.

## 2013-06-22 ENCOUNTER — Inpatient Hospital Stay (HOSPITAL_COMMUNITY): Payer: Medicare (Managed Care) | Admitting: Certified Registered"

## 2013-06-22 ENCOUNTER — Encounter (HOSPITAL_COMMUNITY): Payer: Self-pay | Admitting: *Deleted

## 2013-06-22 ENCOUNTER — Encounter (HOSPITAL_COMMUNITY): Payer: Medicare (Managed Care) | Admitting: Certified Registered"

## 2013-06-22 ENCOUNTER — Inpatient Hospital Stay (HOSPITAL_COMMUNITY)
Admission: RE | Admit: 2013-06-22 | Discharge: 2013-06-24 | DRG: 240 | Disposition: A | Discharge status: Skilled Nursing Facility | Payer: Medicare (Managed Care) | Source: Ambulatory Visit | Attending: Orthopedic Surgery | Admitting: Orthopedic Surgery

## 2013-06-22 ENCOUNTER — Encounter (HOSPITAL_COMMUNITY)
Admission: RE | Disposition: A | Discharge status: Skilled Nursing Facility | Payer: Self-pay | Source: Ambulatory Visit | Attending: Orthopedic Surgery

## 2013-06-22 DIAGNOSIS — I2510 Atherosclerotic heart disease of native coronary artery without angina pectoris: Secondary | ICD-10-CM | POA: Diagnosis present

## 2013-06-22 DIAGNOSIS — M9080 Osteopathy in diseases classified elsewhere, unspecified site: Secondary | ICD-10-CM | POA: Diagnosis present

## 2013-06-22 DIAGNOSIS — L02619 Cutaneous abscess of unspecified foot: Secondary | ICD-10-CM | POA: Diagnosis present

## 2013-06-22 DIAGNOSIS — K219 Gastro-esophageal reflux disease without esophagitis: Secondary | ICD-10-CM | POA: Diagnosis present

## 2013-06-22 DIAGNOSIS — G4733 Obstructive sleep apnea (adult) (pediatric): Secondary | ICD-10-CM | POA: Diagnosis present

## 2013-06-22 DIAGNOSIS — E1165 Type 2 diabetes mellitus with hyperglycemia: Secondary | ICD-10-CM

## 2013-06-22 DIAGNOSIS — E1169 Type 2 diabetes mellitus with other specified complication: Secondary | ICD-10-CM | POA: Diagnosis present

## 2013-06-22 DIAGNOSIS — I119 Hypertensive heart disease without heart failure: Secondary | ICD-10-CM | POA: Diagnosis present

## 2013-06-22 DIAGNOSIS — IMO0002 Reserved for concepts with insufficient information to code with codable children: Secondary | ICD-10-CM

## 2013-06-22 DIAGNOSIS — J449 Chronic obstructive pulmonary disease, unspecified: Secondary | ICD-10-CM

## 2013-06-22 DIAGNOSIS — H548 Legal blindness, as defined in USA: Secondary | ICD-10-CM | POA: Diagnosis present

## 2013-06-22 DIAGNOSIS — M109 Gout, unspecified: Secondary | ICD-10-CM

## 2013-06-22 DIAGNOSIS — E1142 Type 2 diabetes mellitus with diabetic polyneuropathy: Secondary | ICD-10-CM | POA: Diagnosis present

## 2013-06-22 DIAGNOSIS — Z950 Presence of cardiac pacemaker: Secondary | ICD-10-CM

## 2013-06-22 DIAGNOSIS — J4489 Other specified chronic obstructive pulmonary disease: Secondary | ICD-10-CM | POA: Diagnosis present

## 2013-06-22 DIAGNOSIS — I739 Peripheral vascular disease, unspecified: Secondary | ICD-10-CM | POA: Diagnosis present

## 2013-06-22 DIAGNOSIS — Z8249 Family history of ischemic heart disease and other diseases of the circulatory system: Secondary | ICD-10-CM

## 2013-06-22 DIAGNOSIS — Z87891 Personal history of nicotine dependence: Secondary | ICD-10-CM

## 2013-06-22 DIAGNOSIS — I43 Cardiomyopathy in diseases classified elsewhere: Secondary | ICD-10-CM | POA: Diagnosis present

## 2013-06-22 DIAGNOSIS — I10 Essential (primary) hypertension: Secondary | ICD-10-CM

## 2013-06-22 DIAGNOSIS — L97509 Non-pressure chronic ulcer of other part of unspecified foot with unspecified severity: Secondary | ICD-10-CM | POA: Diagnosis not present

## 2013-06-22 DIAGNOSIS — N400 Enlarged prostate without lower urinary tract symptoms: Secondary | ICD-10-CM | POA: Diagnosis present

## 2013-06-22 DIAGNOSIS — E1149 Type 2 diabetes mellitus with other diabetic neurological complication: Secondary | ICD-10-CM | POA: Diagnosis present

## 2013-06-22 DIAGNOSIS — M869 Osteomyelitis, unspecified: Secondary | ICD-10-CM | POA: Diagnosis present

## 2013-06-22 DIAGNOSIS — I96 Gangrene, not elsewhere classified: Secondary | ICD-10-CM | POA: Diagnosis present

## 2013-06-22 DIAGNOSIS — E785 Hyperlipidemia, unspecified: Secondary | ICD-10-CM | POA: Diagnosis present

## 2013-06-22 DIAGNOSIS — M1990 Unspecified osteoarthritis, unspecified site: Secondary | ICD-10-CM

## 2013-06-22 DIAGNOSIS — E1159 Type 2 diabetes mellitus with other circulatory complications: Principal | ICD-10-CM | POA: Diagnosis present

## 2013-06-22 HISTORY — DX: Type 2 diabetes mellitus with diabetic polyneuropathy: E11.42

## 2013-06-22 HISTORY — PX: AMPUTATION: SHX166

## 2013-06-22 HISTORY — DX: Gastro-esophageal reflux disease without esophagitis: K21.9

## 2013-06-22 HISTORY — DX: Unspecified osteoarthritis, unspecified site: M19.90

## 2013-06-22 HISTORY — PX: FOOT AMPUTATION THROUGH METATARSAL: SHX644

## 2013-06-22 HISTORY — DX: Shortness of breath: R06.02

## 2013-06-22 LAB — APTT: aPTT: 28 seconds (ref 24–37)

## 2013-06-22 LAB — CBC
HCT: 48.3 % (ref 39.0–52.0)
Hemoglobin: 16.1 g/dL (ref 13.0–17.0)
MCH: 27.5 pg (ref 26.0–34.0)
MCHC: 33.3 g/dL (ref 30.0–36.0)
MCV: 82.6 fL (ref 78.0–100.0)
RBC: 5.85 MIL/uL — ABNORMAL HIGH (ref 4.22–5.81)

## 2013-06-22 LAB — GLUCOSE, CAPILLARY
Glucose-Capillary: 146 mg/dL — ABNORMAL HIGH (ref 70–99)
Glucose-Capillary: 139 mg/dL — ABNORMAL HIGH (ref 70–99)
Glucose-Capillary: 142 mg/dL — ABNORMAL HIGH (ref 70–99)

## 2013-06-22 LAB — COMPREHENSIVE METABOLIC PANEL
ALT: 33 U/L (ref 0–53)
BUN: 31 mg/dL — ABNORMAL HIGH (ref 6–23)
CO2: 28 mEq/L (ref 19–32)
Calcium: 9.7 mg/dL (ref 8.4–10.5)
GFR calc Af Amer: 57 mL/min — ABNORMAL LOW (ref >90)
GFR calc non Af Amer: 49 mL/min — ABNORMAL LOW (ref >90)
Glucose, Bld: 175 mg/dL — ABNORMAL HIGH (ref 70–99)
Sodium: 140 mEq/L (ref 135–145)
Total Bilirubin: 0.4 mg/dL (ref 0.3–1.2)
Total Protein: 8.2 g/dL (ref 6.0–8.3)

## 2013-06-22 LAB — PROTIME-INR: Prothrombin Time: 13 seconds (ref 11.6–15.2)

## 2013-06-22 SURGERY — AMPUTATION, FOOT, PARTIAL
Anesthesia: General | Site: Foot | Laterality: Left | Wound class: Clean

## 2013-06-22 MED ORDER — EPHEDRINE SULFATE 50 MG/ML IJ SOLN
INTRAMUSCULAR | Status: DC | PRN
  Administered 2013-06-22: 10 mg via INTRAVENOUS
  Administered 2013-06-22: 5 mg via INTRAVENOUS

## 2013-06-22 MED ORDER — CARBAMAZEPINE 200 MG PO TABS
200.0000 mg | ORAL_TABLET | Freq: Two times a day (BID) | ORAL | Status: DC
  Administered 2013-06-22 – 2013-06-24 (×4): 200 mg via ORAL
  Filled 2013-06-22 (×5): qty 1

## 2013-06-22 MED ORDER — FENTANYL CITRATE 0.05 MG/ML IJ SOLN
INTRAMUSCULAR | Status: AC
  Filled 2013-06-22: qty 2

## 2013-06-22 MED ORDER — ONDANSETRON HCL 4 MG PO TABS: 4.0000 mg | ORAL_TABLET | Freq: Four times a day (QID) | ORAL | Status: DC | PRN

## 2013-06-22 MED ORDER — INSULIN GLARGINE 100 UNIT/ML ~~LOC~~ SOLN
40.0000 [IU] | Freq: Every day | SUBCUTANEOUS | Status: DC
  Administered 2013-06-22 – 2013-06-23 (×2): 40 [IU] via SUBCUTANEOUS
  Filled 2013-06-22 (×3): qty 0.4

## 2013-06-22 MED ORDER — COLCHICINE 0.6 MG PO TABS
0.6000 mg | ORAL_TABLET | Freq: Two times a day (BID) | ORAL | Status: DC | PRN
  Filled 2013-06-22: qty 1

## 2013-06-22 MED ORDER — ONDANSETRON HCL 4 MG/2ML IJ SOLN
INTRAMUSCULAR | Status: DC | PRN
  Administered 2013-06-22: 4 mg via INTRAVENOUS

## 2013-06-22 MED ORDER — ASPIRIN 81 MG PO CHEW
81.0000 mg | CHEWABLE_TABLET | Freq: Every day | ORAL | Status: DC
  Administered 2013-06-22 – 2013-06-24 (×3): 81 mg via ORAL
  Filled 2013-06-22 (×3): qty 1

## 2013-06-22 MED ORDER — CLONIDINE HCL 0.2 MG PO TABS
0.2000 mg | ORAL_TABLET | Freq: Every day | ORAL | Status: DC
  Administered 2013-06-22: 0.2 mg via ORAL
  Filled 2013-06-22 (×2): qty 1

## 2013-06-22 MED ORDER — PANTOPRAZOLE SODIUM 40 MG PO TBEC
40.0000 mg | DELAYED_RELEASE_TABLET | Freq: Every evening | ORAL | Status: DC
  Administered 2013-06-22 – 2013-06-23 (×2): 40 mg via ORAL
  Filled 2013-06-22 (×2): qty 1

## 2013-06-22 MED ORDER — ISOSORBIDE MONONITRATE ER 30 MG PO TB24
30.0000 mg | ORAL_TABLET | Freq: Every day | ORAL | Status: DC
  Administered 2013-06-22: 30 mg via ORAL
  Filled 2013-06-22 (×2): qty 1

## 2013-06-22 MED ORDER — 0.9 % SODIUM CHLORIDE (POUR BTL) OPTIME
TOPICAL | Status: DC | PRN
  Administered 2013-06-22: 1000 mL

## 2013-06-22 MED ORDER — FENTANYL CITRATE 0.05 MG/ML IJ SOLN
INTRAMUSCULAR | Status: DC | PRN
  Administered 2013-06-22: 50 ug via INTRAVENOUS

## 2013-06-22 MED ORDER — FEBUXOSTAT 40 MG PO TABS
40.0000 mg | ORAL_TABLET | Freq: Every day | ORAL | Status: DC
  Administered 2013-06-22 – 2013-06-23 (×2): 40 mg via ORAL
  Filled 2013-06-22 (×3): qty 1

## 2013-06-22 MED ORDER — ATORVASTATIN CALCIUM 10 MG PO TABS
10.0000 mg | ORAL_TABLET | Freq: Every day | ORAL | Status: DC
  Administered 2013-06-22 – 2013-06-23 (×2): 10 mg via ORAL
  Filled 2013-06-22 (×3): qty 1

## 2013-06-22 MED ORDER — FENTANYL CITRATE 0.05 MG/ML IJ SOLN
25.0000 ug | INTRAMUSCULAR | Status: DC | PRN
  Administered 2013-06-22: 25 ug via INTRAVENOUS

## 2013-06-22 MED ORDER — CARVEDILOL 25 MG PO TABS
25.0000 mg | ORAL_TABLET | Freq: Two times a day (BID) | ORAL | Status: DC
Start: 2013-06-23 — End: 2013-06-24
  Administered 2013-06-23 – 2013-06-24 (×3): 25 mg via ORAL
  Filled 2013-06-22 (×5): qty 1

## 2013-06-22 MED ORDER — DORZOLAMIDE HCL-TIMOLOL MAL 2-0.5 % OP SOLN
1.0000 [drp] | Freq: Two times a day (BID) | OPHTHALMIC | Status: DC
  Administered 2013-06-22 – 2013-06-24 (×4): 1 [drp] via OPHTHALMIC
  Filled 2013-06-22: qty 10

## 2013-06-22 MED ORDER — METOLAZONE 2.5 MG PO TABS
2.5000 mg | ORAL_TABLET | ORAL | Status: DC
  Filled 2013-06-22: qty 1

## 2013-06-22 MED ORDER — CARVEDILOL 25 MG PO TABS
25.0000 mg | ORAL_TABLET | Freq: Two times a day (BID) | ORAL | Status: DC
  Administered 2013-06-22: 25 mg via ORAL
  Filled 2013-06-22: qty 1
  Filled 2013-06-22: qty 2
  Filled 2013-06-22: qty 1

## 2013-06-22 MED ORDER — PROPOFOL 10 MG/ML IV BOLUS
INTRAVENOUS | Status: DC | PRN
  Administered 2013-06-22: 250 mg via INTRAVENOUS

## 2013-06-22 MED ORDER — METOCLOPRAMIDE HCL 5 MG PO TABS
5.0000 mg | ORAL_TABLET | Freq: Three times a day (TID) | ORAL | Status: DC | PRN
  Filled 2013-06-22: qty 2

## 2013-06-22 MED ORDER — AMIODARONE HCL 200 MG PO TABS
200.0000 mg | ORAL_TABLET | Freq: Every day | ORAL | Status: DC
  Administered 2013-06-22 – 2013-06-24 (×3): 200 mg via ORAL
  Filled 2013-06-22 (×3): qty 1

## 2013-06-22 MED ORDER — INSULIN ASPART 100 UNIT/ML ~~LOC~~ SOLN
0.0000 [IU] | Freq: Three times a day (TID) | SUBCUTANEOUS | Status: DC
  Administered 2013-06-23: 5 [IU] via SUBCUTANEOUS
  Administered 2013-06-23 (×2): 3 [IU] via SUBCUTANEOUS
  Administered 2013-06-24: 2 [IU] via SUBCUTANEOUS
  Administered 2013-06-24: 3 [IU] via SUBCUTANEOUS

## 2013-06-22 MED ORDER — LISINOPRIL 20 MG PO TABS
20.0000 mg | ORAL_TABLET | Freq: Every day | ORAL | Status: DC
  Administered 2013-06-22 – 2013-06-23 (×2): 20 mg via ORAL
  Filled 2013-06-22 (×3): qty 1

## 2013-06-22 MED ORDER — SODIUM CHLORIDE 0.9 % IV SOLN
INTRAVENOUS | Status: DC
  Administered 2013-06-22: 13:00:00 via INTRAVENOUS

## 2013-06-22 MED ORDER — ISOSORBIDE MONONITRATE ER 30 MG PO TB24
30.0000 mg | ORAL_TABLET | Freq: Every day | ORAL | Status: DC
  Administered 2013-06-22 – 2013-06-24 (×3): 30 mg via ORAL
  Filled 2013-06-22 (×3): qty 1

## 2013-06-22 MED ORDER — SODIUM CHLORIDE 0.9 % IV SOLN
INTRAVENOUS | Status: DC
  Administered 2013-06-22: 20:00:00 via INTRAVENOUS

## 2013-06-22 MED ORDER — METOCLOPRAMIDE HCL 5 MG/ML IJ SOLN: 5.0000 mg | Freq: Three times a day (TID) | INTRAMUSCULAR | Status: DC | PRN

## 2013-06-22 MED ORDER — POLYETHYLENE GLYCOL 3350 17 G PO PACK
17.0000 g | PACK | Freq: Every day | ORAL | Status: DC
  Administered 2013-06-22 – 2013-06-24 (×3): 17 g via ORAL
  Filled 2013-06-22 (×3): qty 1

## 2013-06-22 MED ORDER — POTASSIUM CHLORIDE CRYS ER 20 MEQ PO TBCR
20.0000 meq | EXTENDED_RELEASE_TABLET | Freq: Two times a day (BID) | ORAL | Status: DC
  Administered 2013-06-22 – 2013-06-24 (×4): 20 meq via ORAL
  Filled 2013-06-22 (×5): qty 1

## 2013-06-22 MED ORDER — CEFAZOLIN SODIUM-DEXTROSE 2-3 GM-% IV SOLR
2.0000 g | Freq: Four times a day (QID) | INTRAVENOUS | Status: AC
  Administered 2013-06-22 – 2013-06-23 (×3): 2 g via INTRAVENOUS
  Filled 2013-06-22 (×3): qty 50

## 2013-06-22 MED ORDER — FUROSEMIDE 40 MG PO TABS
40.0000 mg | ORAL_TABLET | Freq: Two times a day (BID) | ORAL | Status: DC
  Administered 2013-06-22 – 2013-06-24 (×4): 40 mg via ORAL
  Filled 2013-06-22 (×6): qty 1

## 2013-06-22 MED ORDER — ONDANSETRON HCL 4 MG/2ML IJ SOLN: 4.0000 mg | Freq: Four times a day (QID) | INTRAMUSCULAR | Status: DC | PRN

## 2013-06-22 MED ORDER — INSULIN ASPART 100 UNIT/ML ~~LOC~~ SOLN
8.0000 [IU] | Freq: Two times a day (BID) | SUBCUTANEOUS | Status: DC
  Administered 2013-06-23: 8 [IU] via SUBCUTANEOUS

## 2013-06-22 MED ORDER — DEXTROSE 5 % IV SOLN
3.0000 g | INTRAVENOUS | Status: AC
  Administered 2013-06-22: 3 g via INTRAVENOUS
  Filled 2013-06-22 (×2): qty 3000

## 2013-06-22 MED ORDER — ALBUTEROL SULFATE HFA 108 (90 BASE) MCG/ACT IN AERS
2.0000 | INHALATION_SPRAY | Freq: Four times a day (QID) | RESPIRATORY_TRACT | Status: DC | PRN
  Filled 2013-06-22: qty 6.7

## 2013-06-22 MED ORDER — HYDROMORPHONE HCL PF 1 MG/ML IJ SOLN: 0.5000 mg | INTRAMUSCULAR | Status: DC | PRN

## 2013-06-22 MED ORDER — CLONIDINE HCL 0.2 MG PO TABS
0.2000 mg | ORAL_TABLET | Freq: Two times a day (BID) | ORAL | Status: DC
  Administered 2013-06-22 – 2013-06-24 (×4): 0.2 mg via ORAL
  Filled 2013-06-22 (×5): qty 1

## 2013-06-22 MED ORDER — LIDOCAINE HCL (CARDIAC) 20 MG/ML IV SOLN
INTRAVENOUS | Status: DC | PRN
  Administered 2013-06-22: 80 mg via INTRAVENOUS

## 2013-06-22 MED ORDER — BRIMONIDINE TARTRATE 0.15 % OP SOLN
1.0000 [drp] | Freq: Three times a day (TID) | OPHTHALMIC | Status: DC
  Filled 2013-06-22: qty 5

## 2013-06-22 MED ORDER — BRIMONIDINE TARTRATE 0.2 % OP SOLN
1.0000 [drp] | Freq: Three times a day (TID) | OPHTHALMIC | Status: DC
  Administered 2013-06-22 – 2013-06-24 (×5): 1 [drp] via OPHTHALMIC
  Filled 2013-06-22: qty 5

## 2013-06-22 MED ORDER — LACTATED RINGERS IV SOLN
INTRAVENOUS | Status: DC | PRN
  Administered 2013-06-22: 16:00:00 via INTRAVENOUS

## 2013-06-22 MED ORDER — OXYCODONE-ACETAMINOPHEN 5-325 MG PO TABS
1.0000 | ORAL_TABLET | ORAL | Status: DC | PRN
  Administered 2013-06-22 – 2013-06-23 (×3): 2 via ORAL
  Filled 2013-06-22 (×3): qty 2

## 2013-06-22 MED ORDER — POLYVINYL ALCOHOL 1.4 % OP SOLN
1.0000 [drp] | Freq: Two times a day (BID) | OPHTHALMIC | Status: DC
  Administered 2013-06-22 – 2013-06-24 (×4): 1 [drp] via OPHTHALMIC
  Filled 2013-06-22: qty 15

## 2013-06-22 MED ORDER — EXENATIDE 10 MCG/0.04ML ~~LOC~~ SOPN
10.0000 ug | PEN_INJECTOR | Freq: Two times a day (BID) | SUBCUTANEOUS | Status: DC
  Administered 2013-06-23 – 2013-06-24 (×2): 10 ug via SUBCUTANEOUS
  Filled 2013-06-22: qty 2.4

## 2013-06-22 MED ORDER — PHENYLEPHRINE HCL 10 MG/ML IJ SOLN
INTRAMUSCULAR | Status: DC | PRN
  Administered 2013-06-22 (×5): 80 ug via INTRAVENOUS

## 2013-06-22 MED ORDER — OMEPRAZOLE MAGNESIUM 20 MG PO TBEC
20.0000 mg | DELAYED_RELEASE_TABLET | Freq: Every evening | ORAL | Status: DC
Start: 2013-06-22 — End: 2013-06-22

## 2013-06-22 MED ORDER — OMEPRAZOLE MAGNESIUM 20 MG PO TBEC: 20.0000 mg | DELAYED_RELEASE_TABLET | Freq: Every evening | ORAL | Status: DC

## 2013-06-22 MED ORDER — INSULIN ASPART 100 UNIT/ML ~~LOC~~ SOLN
4.0000 [IU] | Freq: Three times a day (TID) | SUBCUTANEOUS | Status: DC
  Administered 2013-06-23 – 2013-06-24 (×5): 4 [IU] via SUBCUTANEOUS

## 2013-06-22 SURGICAL SUPPLY — 41 items
BANDAGE GAUZE ELAST BULKY 4 IN (GAUZE/BANDAGES/DRESSINGS) ×3 IMPLANT
BLADE SAW SAG 73X25 THK (BLADE) ×1
BLADE SAW SGTL 73X25 THK (BLADE) IMPLANT
BLADE SAW SGTL HD 18.5X60.5X1. (BLADE) ×1 IMPLANT
BLADE SURG 10 STRL SS (BLADE) IMPLANT
BNDG COHESIVE 4X5 TAN STRL (GAUZE/BANDAGES/DRESSINGS) ×3 IMPLANT
CANISTER SUCTION 2500CC (MISCELLANEOUS) ×1 IMPLANT
CLOTH BEACON ORANGE TIMEOUT ST (SAFETY) ×2 IMPLANT
COVER SURGICAL LIGHT HANDLE (MISCELLANEOUS) ×2 IMPLANT
CUFF TOURNIQUET SINGLE 34IN LL (TOURNIQUET CUFF) IMPLANT
CUFF TOURNIQUET SINGLE 44IN (TOURNIQUET CUFF) IMPLANT
DRAPE U-SHAPE 47X51 STRL (DRAPES) ×2 IMPLANT
DRSG ADAPTIC 3X8 NADH LF (GAUZE/BANDAGES/DRESSINGS) ×2 IMPLANT
DRSG PAD ABDOMINAL 8X10 ST (GAUZE/BANDAGES/DRESSINGS) ×2 IMPLANT
DURAPREP 26ML APPLICATOR (WOUND CARE) ×2 IMPLANT
ELECT REM PT RETURN 9FT ADLT (ELECTROSURGICAL) ×2
ELECTRODE REM PT RTRN 9FT ADLT (ELECTROSURGICAL) ×1 IMPLANT
GLOVE BIOGEL PI IND STRL 9 (GLOVE) ×1 IMPLANT
GLOVE BIOGEL PI INDICATOR 9 (GLOVE) ×1
GLOVE SURG ORTHO 9.0 STRL STRW (GLOVE) ×2 IMPLANT
GOWN PREVENTION PLUS XLARGE (GOWN DISPOSABLE) ×2 IMPLANT
GOWN SRG XL XLNG 56XLVL 4 (GOWN DISPOSABLE) ×2 IMPLANT
GOWN STRL NON-REIN XL XLG LVL4 (GOWN DISPOSABLE) ×4
KIT BASIN OR (CUSTOM PROCEDURE TRAY) ×2 IMPLANT
KIT ROOM TURNOVER OR (KITS) ×2 IMPLANT
MANIFOLD NEPTUNE II (INSTRUMENTS) ×1 IMPLANT
NS IRRIG 1000ML POUR BTL (IV SOLUTION) ×2 IMPLANT
PACK ORTHO EXTREMITY (CUSTOM PROCEDURE TRAY) ×2 IMPLANT
PAD ARMBOARD 7.5X6 YLW CONV (MISCELLANEOUS) ×4 IMPLANT
PAD CAST 4YDX4 CTTN HI CHSV (CAST SUPPLIES) ×1 IMPLANT
PADDING CAST COTTON 4X4 STRL (CAST SUPPLIES) ×2
SPONGE GAUZE 4X4 12PLY (GAUZE/BANDAGES/DRESSINGS) ×2 IMPLANT
SPONGE LAP 18X18 X RAY DECT (DISPOSABLE) ×2 IMPLANT
STAPLER VISISTAT 35W (STAPLE) ×2 IMPLANT
SUT ETHILON 2 0 PSLX (SUTURE) ×6 IMPLANT
SUT VIC AB 2-0 CTB1 (SUTURE) ×4 IMPLANT
TOWEL OR 17X24 6PK STRL BLUE (TOWEL DISPOSABLE) ×2 IMPLANT
TOWEL OR 17X26 10 PK STRL BLUE (TOWEL DISPOSABLE) ×2 IMPLANT
TUBE CONNECTING 12X1/4 (SUCTIONS) ×2 IMPLANT
WATER STERILE IRR 1000ML POUR (IV SOLUTION) ×1 IMPLANT
YANKAUER SUCT BULB TIP NO VENT (SUCTIONS) ×2 IMPLANT

## 2013-06-22 NOTE — Op Note (Signed)
OPERATIVE REPORT  DATE OF SURGERY: 06/22/2013  PATIENT:  [name],  67 y.o. male  PRE-OPERATIVE DIAGNOSIS:  Osteomyelitis Left Forefoot, Ulcer, and Gangrene  POST-OPERATIVE DIAGNOSIS: Osteomyelitis left forefoot ulceration and gangrene  PROCEDURE:  Procedure(s): AMPUTATION FOOT  SURGEON:  Surgeon(s): Newt Minion, MD  ANESTHESIA:   general  EBL:  min ML  SPECIMEN:  Source of Specimen:  Left forefoot  TOURNIQUET:    PROCEDURE DETAILS: Patient is a 67 year old gentleman diabetic insensate neuropathy who presents with gangrene of the dorsum of the great toe and second toe with Wagner grade 3 ulceration beneath the fifth metatarsal head which is over 4 cm diameter with osteomyelitis of the fifth metatarsal head. Patient has failed conservative care and presents at this time for surgical intervention. Risks and benefits were discussed including infection nonhealing of the wound need for higher level amputation. Patient states he understands and wished to proceed at this time. Description of procedure patient was brought to the operating room underwent a general anesthetic. After adequate levels and anesthesia were obtained patient's leg was first scrubbed with DuraPrep cleansed and pain with DuraPrep and draped into a sterile field. A fishmouth incision was made to include the ulceration and the necrotic forefoot. A transmetatarsal amputation was performed there was good bleeding tissue hemostasis was obtained the incision was irrigated with normal saline and closed with 2-0 nylon. The wound is covered with Adaptic orthopedic sponges AB dressing Kerlix and Coban. Patient was extubated taken to the PACU in stable condition.  PLAN OF CARE: Admit to inpatient   PATIENT DISPOSITION:  PACU - hemodynamically stable.   Newt Minion, MD 06/22/2013 4:08 PM

## 2013-06-22 NOTE — Transfer of Care (Signed)
Immediate Anesthesia Transfer of Care Note  Patient: [name]  Procedure(s) Performed: Procedure(s) with comments: AMPUTATION FOOT (Left) - Left Midfoot Amputation  Patient Location: PACU  Anesthesia Type:General  Level of Consciousness: awake, alert  and oriented  Airway & Oxygen Therapy: Patient Spontanous Breathing and Patient connected to nasal cannula oxygen  Post-op Assessment: Report given to PACU RN and Post -op Vital signs reviewed and stable  Post vital signs: Reviewed and stable  Complications: No apparent anesthesia complications

## 2013-06-22 NOTE — H&P (Signed)
[name] is an 67 y.o. male.   Chief Complaint: Ulceration abscess osteomyelitis left forefoot HPI: Patient is a 67 year old gentleman with diabetic insensate neuropathy hypertension peripheral vascular disease who has failed conservative wound care for her left forefoot infection and presents at this time for transmetatarsal amputation  Past Medical History  Diagnosis Date  . Diabetes type 2, uncontrolled   . Gout   . Hyperlipidemia   . Hypertension   . Obstructive sleep apnea     On CPAP. Last sleep study (06/2009) - Severe obstructive sleep apnea/hypopnea syndrome, apnea-hypopnea index 70.5 per hour with non positional events moderately loud snoring and oxygen desaturation to a nadir of 85% on room air.  . Benign prostatic hypertrophy   . Morbid obesity   . Cardiomyopathy, hypertensive      diastolic dysfunction by [id number] echo  . Coronary artery disease      nonobstructive. 2 vessel. cardiac catheter in March 2005 showing 60% LAD occlusion, 30% circumflex occlusion.  . 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 Legally blind   . Herpes   . Occult blood positive stool      History of in August 2007.  colonoscopy in January 2008 showing normal colon with fair inadequate prep. By Dr. Silvano Rusk.  . Degenerative joint disease      Right knee.  . Incarcerated ventral hernia     H/O. S/P repair ventral hernia repair 07/2005.  . Diabetic ulcer of thigh     H/O Rt thigh ulcer.    Past Surgical History  Procedure Laterality Date  . Pacemaker insertion  10/2003    Secondary to symptomatic bradycardia. DDDR pacemaker. By Dr. Rollene Fare.  . Pacemaker lead revision  12/2003     likely microdislodgment  of right ventricular lead  . Ventral hernia repair  07/2005     S/P laparotomy, lysis of adhesions, repair of incarcerated ventral hernia with  partial omentectomy.. Performed by Dr. Marlou Starks.  . Eye surgery Bilateral     cataract, lazer    Family History  Problem Relation Age  of Onset  . Coronary artery disease Sister   . Heart disease Sister   . Allergies Sister   . Asthma Brother   . Rheum arthritis Brother    Social History:  reports that he quit smoking about 9 years ago. His smoking use included Cigarettes. He has a 3.3 pack-year smoking history. He does not have any smokeless tobacco history on file. He reports that he does not drink alcohol or use illicit drugs.  Allergies: No Known Allergies  No prescriptions prior to admission    No results found for this or any previous visit (from the past 48 hour(s)). No results found.  Review of Systems  All other systems reviewed and are negative.    There were no vitals taken for this visit. Physical Exam  On examination patient has palpable pulses. There is ulceration osteomyelitis and infection left forefoot.  Assessment/Plan Assessment: Left forefoot osteomyelitis abscess ulceration.  Plan: Will plan for a left midfoot amputation. Risks and benefits were discussed including infection neurovascular injury pain DVT pulmonary embolus need for additional surgery. Patient states he understands and wished to proceed at this time.  DUDA,MARCUS V 06/22/2013, 6:17 AM

## 2013-06-22 NOTE — Anesthesia Procedure Notes (Signed)
Procedure Name: LMA Insertion Date/Time: 06/22/2013 3:29 PM Performed by: Manuela Schwartz B Pre-anesthesia Checklist: Patient identified, Emergency Drugs available, Suction available, Patient being monitored and Timeout performed Patient Re-evaluated:Patient Re-evaluated prior to inductionOxygen Delivery Method: Circle system utilized Preoxygenation: Pre-oxygenation with 100% oxygen Intubation Type: IV induction LMA: LMA with gastric port inserted LMA Size: 5.0 Number of attempts: 1 Placement Confirmation: positive ETCO2 and breath sounds checked- equal and bilateral Tube secured with: Tape Dental Injury: Teeth and Oropharynx as per pre-operative assessment

## 2013-06-22 NOTE — Progress Notes (Signed)
Plan for an discharged to [facility] skilled nursing facility

## 2013-06-22 NOTE — Progress Notes (Signed)
Patient didn't take any meds for htn this am.  I spoke with Dr. Royce Macadamia, anesthesiologist.  He would like patient to have Coreg, Imdur, Clonidine...Marland Kitchenpo now.

## 2013-06-22 NOTE — Anesthesia Postprocedure Evaluation (Signed)
  Anesthesia Post-op Note  Patient: [name]  Procedure(s) Performed: Procedure(s) with comments: AMPUTATION FOOT (Left) - Left Midfoot Amputation  Patient Location: PACU  Anesthesia Type:General  Level of Consciousness: awake, alert  and oriented  Airway and Oxygen Therapy: Patient Spontanous Breathing  Post-op Pain: mild  Post-op Assessment: Post-op Vital signs reviewed  Post-op Vital Signs: Reviewed  Complications: No apparent anesthesia complications

## 2013-06-22 NOTE — Anesthesia Preprocedure Evaluation (Signed)
Anesthesia Evaluation  Patient identified by MRN, date of birth, ID band Patient awake    Reviewed: Allergy & Precautions, H&P , NPO status , Patient's Chart, lab work & pertinent test results, reviewed documented beta blocker date and time   Airway Mallampati: IV TM Distance: >3 FB Neck ROM: Full    Dental  (+) Missing   Pulmonary neg pulmonary ROS, shortness of breath, sleep apnea , COPD   Pulmonary exam normal       Cardiovascular hypertension, + CAD and + Peripheral Vascular Disease + pacemaker Rhythm:Regular Rate:Normal     Neuro/Psych PSYCHIATRIC DISORDERS Depression Diabetic Neuropathy    GI/Hepatic Neg liver ROS, GERD-  Medicated,  Endo/Other  diabetes, Poorly Controlled, Type 2, Oral Hypoglycemic Agents and Insulin DependentHyperlipidemia  Renal/GU Renal InsufficiencyRenal disease   Urinary incontinence    Musculoskeletal  (+) Arthritis -, Osteoarthritis,    Abdominal (+) + obese,   Peds  Hematology negative hematology ROS (+)   Anesthesia Other Findings   Reproductive/Obstetrics HSV                           Anesthesia Physical Anesthesia Plan  ASA: III  Anesthesia Plan: General   Post-op Pain Management:    Induction: Intravenous  Airway Management Planned: LMA  Additional Equipment:   Intra-op Plan:   Post-operative Plan: Extubation in OR  Informed Consent: I have reviewed the patients History and Physical, chart, labs and discussed the procedure including the risks, benefits and alternatives for the proposed anesthesia with the patient or authorized representative who has indicated his/her understanding and acceptance.   Dental advisory given  Plan Discussed with: CRNA, Anesthesiologist and Surgeon  Anesthesia Plan Comments:         Anesthesia Quick Evaluation

## 2013-06-23 ENCOUNTER — Encounter (HOSPITAL_COMMUNITY): Payer: Self-pay | Admitting: General Practice

## 2013-06-23 LAB — GLUCOSE, CAPILLARY
Glucose-Capillary: 157 mg/dL — ABNORMAL HIGH (ref 70–99)
Glucose-Capillary: 217 mg/dL — ABNORMAL HIGH (ref 70–99)
Glucose-Capillary: 186 mg/dL — ABNORMAL HIGH (ref 70–99)
Glucose-Capillary: 116 mg/dL — ABNORMAL HIGH (ref 70–99)

## 2013-06-23 NOTE — Progress Notes (Signed)
RT attempted to place patient on CPAP per MD order, he refused to wear at this time. Patient said he will call back when he needs to use the CPAP.

## 2013-06-23 NOTE — Progress Notes (Signed)
Subjective: 1 Day Post-Op Procedure(s) (LRB): AMPUTATION FOOT (Left) Patient reports pain as mild.  Worked with therapy today.  Recommending SNF placement.  Apparently, will be going to [place] from here.  Objective: Vital signs in last 24 hours: Temp:  [96.8 F (36 C)-97.8 F (36.6 C)] 97.8 F (36.6 C) (11/08 0611) Pulse Rate:  [69-79] 72 (11/08 0611) Resp:  [12-20] 18 (11/08 0611) BP: (127-182)/(81-113) 140/98 mmHg (11/08 0611) SpO2:  [92 %-100 %] 98 % (11/08 0611)  Intake/Output from previous day: 11/07 0701 - 11/08 0700 In: 1200 [I.V.:1200] Out: 550 [Urine:350; Blood:200] Intake/Output this shift: Total I/O In: 662.3 [P.O.:320; I.V.:242.3; IV Piggyback:100] Out: -    Recent Labs  06/22/13 1150  HGB 16.1    Recent Labs  06/22/13 1150  WBC 10.0  RBC 5.85*  HCT 48.3  PLT 140*    Recent Labs  06/22/13 1150  NA 140  K 4.6  CL 102  CO2 28  BUN 31*  CREATININE 1.45*  GLUCOSE 175*  CALCIUM 9.7    Recent Labs  06/22/13 1150  INR 1.00    Incision: dressing C/D/I  Assessment/Plan: 1 Day Post-Op Procedure(s) (LRB): AMPUTATION FOOT (Left) Discharge to SNF when bed available at [place] 06/23/2013, 12:38 PM

## 2013-06-23 NOTE — Evaluation (Signed)
Occupational Therapy Evaluation and Discharge Patient Details Name: [name] MRN: [id number] DOB: July 27, 1946 Today's Date: 06/23/2013 Time: [id number] OT Time Calculation (min): 27 min  OT Assessment / Plan / Recommendation History of present illness Pt with h/o PVD, DM and ulcers L foot.  Pt developed gangrene in L foot wound requiring transmetatarsal amputation 06-22-13.   Clinical Impression   This 67 yo male admitted and underwent above presents to acute OT with deficits below. Will benefit from follow up OT at SNF--per daughter arrangements have already been made at [facility]. Acute OT will sign off.    OT Assessment  All further OT needs can be met in the next venue of care    Follow Up Recommendations  SNF       Equipment Recommendations   (TBD at next venue)          Precautions / Restrictions Precautions Precautions: Fall Restrictions Weight Bearing Restrictions: Yes LLE Weight Bearing: Touchdown weight bearing       ADL  ADL Comments: Pt is currently Min A for UBADLs and total A for LBADLs. Pt has a soft touch call bell however he was unable to answer his phone due to not being able to find the correct button--adapted the button with a a monitor lead attachment    OT Diagnosis: Generalized weakness;Disturbance of vision  OT Problem List: Decreased range of motion;Decreased activity tolerance;Impaired balance (sitting and/or standing);Obesity;Impaired vision/perception  Acute Rehab OT Goals Patient Stated Goal: to go to rehab and then home  Visit Information  Last OT Received On: 06/23/13 Assistance Needed: +2 History of Present Illness: Pt with h/o PVD, DM and ulcers L foot.  Pt developed gangrene in L foot wound requiring transmetatarsal amputation 06-22-13.       Prior Functioning     Home Living Family/patient expects to be discharged to:: Skilled nursing facility Living Arrangements: Alone Prior Function Level of Independence: Needs  assistance ADL's / Homemaking Assistance Needed: Pt had HHA 4 hrs/day 7 days/week. Comments: Pt is blind.  He lost complete vision approximately 5-6 months ago. Communication Communication: No difficulties         Vision/Perception Vision - History Baseline Vision: Legally blind (cannot see even light and dark contrasts--it was progressive) Patient Visual Report: No change from baseline   Cognition  Cognition Arousal/Alertness: Awake/alert Behavior During Therapy: WFL for tasks assessed/performed Overall Cognitive Status: Within Functional Limits for tasks assessed    Extremity/Trunk Assessment Upper Extremity Assessment Upper Extremity Assessment: Overall WFL for tasks assessed (5/5)              End of Session OT - End of Session Patient left: in chair;with call bell/phone within reach;with family/visitor present Nurse Communication:  (I had modified the phone and put up a "patient is blind" sign above his bed)       [name] [id number] 06/23/2013, 12:06 PM

## 2013-06-23 NOTE — Evaluation (Signed)
Physical Therapy Evaluation Patient Details Name: [name] MRN: [id number] DOB: [birth year] Today's Date: 06/23/2013 Time: [id number] PT Time Calculation (min): 26 min  PT Assessment / Plan / Recommendation History of Present Illness  Pt with h/o PVD, DM and ulcers L foot.  Pt developed gangrene in L foot wound requiring transmetatarsal amputation 06-22-13.  Clinical Impression  Patient is s/p L transmetatarsal amputation surgery resulting in functional limitations due to the deficits listed below (see PT Problem List). Pt is also blind.  He was using a w/c and 4WW prior to sx.  Patient will benefit from skilled PT to increase their independence and safety with mobility to allow discharge to the venue listed below. D/C plan is for [facility].      PT Assessment  Patient needs continued PT services    Follow Up Recommendations  SNF    Does the patient have the potential to tolerate intense rehabilitation      Barriers to Discharge Decreased caregiver support      Equipment Recommendations  None recommended by PT    Recommendations for Other Services     Frequency Min 3X/week    Precautions / Restrictions Restrictions LLE Weight Bearing: Touchdown weight bearing   Pertinent Vitals/Pain 5/10      Mobility  Bed Mobility Bed Mobility: Supine to Sit Supine to Sit: 1: +2 Total assist Supine to Sit: Patient Percentage: 50% Details for Bed Mobility Assistance: verbal cues for sequencing Transfers Transfers: Sit to Stand;Stand to Sit;Stand Pivot Transfers Sit to Stand: 3: Mod assist;With armrests;From chair/3-in-1;With upper extremity assist Stand to Sit: 3: Mod assist;With upper extremity assist;With armrests;To chair/3-in-1 Stand Pivot Transfers: 1: +2 Total assist Stand Pivot Transfers: Patient Percentage: 50% Details for Transfer Assistance: verbal cues for hand placement, technique.  Assist needed to control descent with stand to sit.    Exercises     PT  Diagnosis: Difficulty walking;Acute pain;Generalized weakness  PT Problem List: Decreased strength;Decreased activity tolerance;Decreased mobility;Decreased balance;Decreased knowledge of precautions;Pain PT Treatment Interventions: DME instruction;Gait training;Functional mobility training;Therapeutic activities;Therapeutic exercise;Patient/family education;Balance training     PT Goals(Current goals can be found in the care plan section) Acute Rehab PT Goals Patient Stated Goal: home PT Goal Formulation: With patient Time For Goal Achievement: 07/07/13 Potential to Achieve Goals: Good  Visit Information  Last PT Received On: 06/23/13 Assistance Needed: +2 History of Present Illness: Pt with h/o PVD, DM and ulcers L foot.  Pt developed gangrene in L foot wound requiring transmetatarsal amputation 06-22-13.       Prior Functioning  Home Living Family/patient expects to be discharged to:: Skilled nursing facility Living Arrangements: Alone Prior Function Level of Independence: Needs assistance ADL's / Homemaking Assistance Needed: Pt had HHA 4 hrs/day 7 days/week. Comments: Pt is blind.  He lost complete vision approximately 5-6 months ago. Communication Communication: No difficulties    Cognition  Cognition Arousal/Alertness: Awake/alert Behavior During Therapy: WFL for tasks assessed/performed Overall Cognitive Status: Within Functional Limits for tasks assessed    Extremity/Trunk Assessment     Balance Balance Balance Assessed: Yes Static Standing Balance Static Standing - Balance Support: Bilateral upper extremity supported Static Standing - Level of Assistance: 4: Min assist Static Standing - Comment/# of Minutes: 2 minutes  End of Session PT - End of Session Equipment Utilized During Treatment: Gait belt Activity Tolerance: Patient tolerated treatment well Patient left: in chair;with call bell/phone within reach;with family/visitor present Nurse Communication:  Mobility status  GP     Lorriane Shire  06/23/2013, 11:40 AM  Lorrin Goodell, PT  Office # [phone] Pager [phone]

## 2013-06-24 ENCOUNTER — Encounter: Payer: Self-pay | Admitting: Cardiovascular Disease

## 2013-06-24 DIAGNOSIS — I480 Paroxysmal atrial fibrillation: Secondary | ICD-10-CM | POA: Insufficient documentation

## 2013-06-24 LAB — GLUCOSE, CAPILLARY: Glucose-Capillary: 170 mg/dL — ABNORMAL HIGH (ref 70–99)

## 2013-06-24 MED ORDER — HYDROCODONE-ACETAMINOPHEN 5-325 MG PO TABS: 1.0000 | ORAL_TABLET | Freq: Four times a day (QID) | ORAL | Status: DC | PRN

## 2013-06-24 NOTE — Assessment & Plan Note (Signed)
His atrial electrogram is at times looks rather organized consistent with atrial flutter, but overall I think he actually has atrial fibrillation. He is unfortunately apparently not a good candidate for warfarin anticoagulation. He does not appear that the arrhythmia has led to any change in functional status. I'm not sure that amiodarone therapy is actually beneficial beyond its effects for rate control. Since this is the first time that I met him I would like to not make any changes in therapy today, but we'll likely interrupted amiodarone especially she remains in persistent atrial fibrillation at his next visit in 3 months. Unfortunately his risk for cardioembolic stroke is substantial since he also has hypertension and diabetes mellitus. Am not sure to what degree his blindness and concerns about compliance of medication played in the decision to interrupt his anticoagulation. If this is the only concern, then we can prescribe a novel anticoagulant. If the rectal bleeding was a major cause for stopping the medication than aspirin appears to be the better choice.

## 2013-06-24 NOTE — Discharge Summary (Signed)
Patient ID: [name] MRN: [id number] DOB/AGE: 65-Aug-1947 65 y.o.  Admit date: 06/22/2013 Discharge date: 06/24/2013  Admission Diagnoses:  Active Problems:   * No active hospital problems. *   Discharge Diagnoses:  Same  Past Medical History  Diagnosis Date  . Diabetes type 2, uncontrolled   . Gout   . Hyperlipidemia   . Hypertension   . Obstructive sleep apnea     On CPAP. Last sleep study (06/2009) - Severe obstructive sleep apnea/hypopnea syndrome, apnea-hypopnea index 70.5 per hour with non positional events moderately loud snoring and oxygen desaturation to a nadir of 85% on room air.  . Benign prostatic hypertrophy   . Morbid obesity   . Cardiomyopathy, hypertensive      diastolic dysfunction by [id number] echo  . Coronary artery disease      nonobstructive. 2 vessel. cardiac catheter in March 2005 showing 60% LAD occlusion, 30% circumflex occlusion.  . 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 Legally blind   . Herpes   . Occult blood positive stool      History of in August 2007.  colonoscopy in January 2008 showing normal colon with fair inadequate prep. By Dr. Silvano Rusk.  . Incarcerated ventral hernia     H/O. S/P repair ventral hernia repair 07/2005.  . Diabetic ulcer of thigh     H/O Rt thigh ulcer.  . Diabetic peripheral neuropathy   . Exertional shortness of breath     "comes and goes" (06/23/2013)  . GERD (gastroesophageal reflux disease)   . Degenerative joint disease      Right knee.  . Arthritis     "all over" (06/23/2013)    Surgeries: Procedure(s): AMPUTATION FOOT on 06/22/2013   Consultants:    Discharged Condition: Improved  Hospital Course: [name] is an 67 y.o. male who was admitted 06/22/2013 for operative treatment of<principal problem not specified>. Patient has severe unremitting pain that affects sleep, daily activities, and work/hobbies. After pre-op clearance the patient was taken to the operating room on 06/22/2013  and underwent  Procedure(s): AMPUTATION FOOT.    Patient was given perioperative antibiotics: Anti-infectives   Start     Dose/Rate Route Frequency Ordered Stop   06/22/13 1745  ceFAZolin (ANCEF) IVPB 2 g/50 mL premix     2 g 100 mL/hr over 30 Minutes Intravenous Every 6 hours 06/22/13 1744 06/23/13 1002   06/22/13 1200  ceFAZolin (ANCEF) 3 g in dextrose 5 % 50 mL IVPB     3 g 160 mL/hr over 30 Minutes Intravenous On call to O.R. 06/22/13 1125 06/22/13 1535       Patient was given sequential compression devices, early ambulation, and chemoprophylaxis to prevent DVT.  Patient benefited maximally from hospital stay and there were no complications.    Recent vital signs: Patient Vitals for the past 24 hrs:  BP Temp Temp src Pulse Resp SpO2  06/24/13 0640 128/88 mmHg 97.8 F (36.6 C) Oral 77 18 99 %  06/23/13 2113 105/60 mmHg 98.5 F (36.9 C) Oral 82 18 99 %  06/23/13 1621 127/84 mmHg 98.5 F (36.9 C) Oral 80 18 96 %     Recent laboratory studies:  Recent Labs  06/22/13 1150  WBC 10.0  HGB 16.1  HCT 48.3  PLT 140*  NA 140  K 4.6  CL 102  CO2 28  BUN 31*  CREATININE 1.45*  GLUCOSE 175*  INR 1.00  CALCIUM 9.7     Discharge Medications:  Medication List    TAKE these medications       albuterol 108 (90 BASE) MCG/ACT inhaler  Commonly known as:  PROVENTIL HFA;VENTOLIN HFA  Inhale 2 puffs into the lungs every 6 (six) hours as needed for wheezing.     amiodarone 200 MG tablet  Commonly known as:  PACERONE  Take 200 mg by mouth daily.     aspirin 81 MG chewable tablet  Chew 81 mg by mouth daily.     brimonidine 0.1 % Soln  Commonly known as:  ALPHAGAN P  Place 1 drop into both eyes every 8 (eight) hours.     carbamazepine 200 MG tablet  Commonly known as:  TEGRETOL  Take 200 mg by mouth 2 (two) times daily.     carvedilol 25 MG tablet  Commonly known as:  COREG  Take 25 mg by mouth 2 (two) times daily with a meal.     cholecalciferol 1000 UNITS  tablet  Commonly known as:  VITAMIN D  Take 1,000 Units by mouth every evening.     colchicine 0.6 MG tablet  Take 0.6 mg by mouth 2 (two) times daily as needed (for gout flare-ups).     dorzolamide-timolol 22.3-6.8 MG/ML ophthalmic solution  Commonly known as:  COSOPT  Place 1 drop into both eyes 2 (two) times daily.     doxycycline 100 MG capsule  Commonly known as:  VIBRAMYCIN  Take 100 mg by mouth 2 (two) times daily.     exenatide 10 MCG/0.04ML Sopn injection  Commonly known as:  BYETTA  Inject 10 mcg into the skin 2 (two) times daily with a meal.     febuxostat 40 MG tablet  Commonly known as:  ULORIC  Take 40 mg by mouth at bedtime.     furosemide 40 MG tablet  Commonly known as:  LASIX  Take 40 mg by mouth 2 (two) times daily.     glucose blood test strip  1 each by Other route as needed. Use as instructed to check blood sugar before meals and at bedtime at least 2-3 time daily     HYDROcodone-acetaminophen 5-325 MG per tablet  Commonly known as:  NORCO  Take 1-2 tablets by mouth every 6 (six) hours as needed.     insulin aspart 100 UNIT/ML injection  Commonly known as:  novoLOG  Inject 8 Units into the skin 2 (two) times daily before a meal.     insulin glargine 100 UNIT/ML injection  Commonly known as:  LANTUS  Inject 40 Units into the skin at bedtime.     Insulin Pen Needle 31G X 8 MM Misc  Use to inject insulin 2x daily before breakfast and dinner.     isosorbide mononitrate 30 MG 24 hr tablet  Commonly known as:  IMDUR  Take 30 mg by mouth daily.     lisinopril 20 MG tablet  Commonly known as:  PRINIVIL,ZESTRIL  Take 20 mg by mouth at bedtime.     magnesium oxide 400 MG tablet  Commonly known as:  MAG-OX  Take 400 mg by mouth daily.     methylcellulose 1 % ophthalmic solution  Commonly known as:  ARTIFICIAL TEARS  Place 1 drop into both eyes 2 (two) times daily.     metolazone 2.5 MG tablet  Commonly known as:  ZAROXOLYN  Take 2.5 mg by  mouth 2 (two) times a week. Takes on Tuesdays and Fridays.     omeprazole 20 MG tablet  Commonly known as:  PRILOSEC OTC  Take 20 mg by mouth every evening.     polyethylene glycol packet  Commonly known as:  MIRALAX / GLYCOLAX  Take 17 g by mouth daily.     potassium chloride SA 20 MEQ tablet  Commonly known as:  K-DUR,KLOR-CON  Take 20 mEq by mouth 2 (two) times daily.     rosuvastatin 40 MG tablet  Commonly known as:  CRESTOR  Take 40 mg by mouth every evening.      ASK your doctor about these medications       cloNIDine 0.3 MG tablet  Commonly known as:  CATAPRES  Take 0.3 mg by mouth 2 (two) times daily.  Ask about: Which instructions should I use?     cloNIDine 0.2 MG tablet  Commonly known as:  CATAPRES  Take 0.2 mg by mouth 2 (two) times daily.  Ask about: Which instructions should I use?        Diagnostic Studies: Ct Foot Left W Contrast  06/11/2013   CLINICAL DATA:  Plantar forefoot pain and ulceration. Evaluate for osteomyelitis.  EXAM: CT OF THE LEFT FOOT WITH CONTRAST  TECHNIQUE: Multidetector CT imaging was performed following the standard protocol during bolus administration of intravenous contrast.  CONTRAST:  37mL OMNIPAQUE IOHEXOL 300 MG/ML  SOLN  COMPARISON:  Foot radiographs 05/10/2013.  FINDINGS: There is ill-defined increased density throughout the subcutaneous fat plantar to the 5th metatarsophalangeal joint. The overlying skin is irregular with hyperdensity, possibly related to associated ointment or overlying bandages. No drainable fluid collection or well-defined sinus tract is identified.  The bones are diffusely demineralized. There is concern of possible early cortical erosion involving the plantar aspect of the 5th metatarsal head, best seen on the sagittal images. Given the adjacent soft tissue findings, early osteomyelitis cannot be excluded.  No other bone destruction is identified. Mild degenerative changes are present at the 1st metatarsal  phalangeal joint and in the fibular sesamoid of the 1st metatarsal . Midfoot degenerative changes are present with subchondral cysts in the talar head, navicular, medial cuneiform and 2nd metatarsal base. The alignment at the Lisfranc joint is normal. There is mild calcaneal spurring.  Diffuse muscular atrophy is noted throughout the foot.  IMPRESSION: 1. Soft tissue changes plantar to the 5th metatarsophalangeal joint with apparent overlying skin ulceration . No drainable abscess identified. 2. Early osteomyelitis of the 5th metatarsal head cannot be excluded. No other bone destruction identified. There are scattered midfoot and forefoot degenerative changes as described. 3. Diffuse muscular atrophy.   Electronically Signed   By: Camie Patience M.D.   On: 06/11/2013 15:32    Disposition:  To [facility]      Discharge Orders   Future Appointments Provider Department Dept Phone   12/20/2013 9:30 AM Kathee Delton, MD [facility] [phone]   Future Orders Complete By Expires   Call MD / Call 911  As directed    Comments:     If you experience chest pain or shortness of breath, CALL 911 and be transported to the hospital emergency room.  If you develope a fever above 101 F, pus (white drainage) or increased drainage or redness at the wound, or calf pain, call your surgeon's office.   Constipation Prevention  As directed    Comments:     Drink plenty of fluids.  Prune juice may be helpful.  You may use a stool softener, such as Colace (over the counter) 100 mg twice a  day.  Use MiraLax (over the counter) for constipation as needed.   Diet - low sodium heart healthy  As directed    Discharge instructions  As directed    Comments:     Keep left foot dressing on, clean, and dry.   Discharge patient  As directed    Increase activity slowly as tolerated  As directed       Follow-up Information   Follow up with DUDA,MARCUS V, MD. Schedule an appointment as soon as possible for a visit in 2  weeks.   Specialty:  Orthopedic Surgery   Contact information:   [place] [address] [phone]        Signed: Mcarthur Rossetti 06/24/2013, 11:30 AM

## 2013-06-24 NOTE — Assessment & Plan Note (Signed)
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 Preserved left ventricular systolic function. Asymptomatic. Focus on risk factor modification.

## 2013-06-24 NOTE — Progress Notes (Signed)
Clinical Education officer, museum (CSW) received call from RN that patient is medically ready for D/C today to [place]. Patient is a part of [facility] and they have facilitated placement at [facility] for patient. [facility] Tour manager reported that the [employer] completed [employer] and Chemical engineer for [employer]. CSW confirmed patient had been issued a [facility] number in [place] [employer]. CSW prepared D/C packet and faxed D/C summary to [facility]. [facility] Nurse Practitioner reported that patient's family has been made aware that he is going to [place] today. Nursing is aware of above. Please reconsult if further social work needs arise. CSW signing off.   Blima Rich, [place] [employer] CSW [phone]

## 2013-06-24 NOTE — Progress Notes (Signed)
Patient ID: [name], male   DOB: 10-23-1945, 67 y.o.   MRN: [id number]      Reason for office visit Established cardiology care, pacemaker  [name] is a 67 year old morbidly obese man with a history of diabetes mellitus, hypertension, obstructive sleep apnea on CPAP, mild valvular aortic stenosis, mild coronary atherosclerosis treated medically, legal blindness, severe diabetic peripheral neuropathy complicated by a poorly healing wounds, chronic venous insufficiency and a dual-chamber permanent pacemaker that was implanted in 2005 for sinus node dysfunction. I performed his pacemaker generator change in March 2014 (Medtronic Adapta). He has preserved left ventricular systolic function but has echo findings suggestive of mild diastolic dysfunction.  He has not had any new recent health challenges. His activities severely limited by his vision problems. He denies any chest pain, edema, dizziness or syncope or any change in his pattern of chronic shortness of breath. His primary care physician stopped his warfarin anticoagulation because of rectal bleeding.  His pacemaker shows persistent atrial fibrillation since June. He was started on amiodarone at that time but has remained in the arrhythmia without interruption. His ventricular rate is well controlled. Since he is no longer taking warfarin I have asked him to increase his dose of aspirin to 162 mg daily.  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   He has not had a full lower showed arterial duplex study that I can locate in several years. This was scheduled in our office a couple of times but was canceled.   No Known Allergies  No current facility-administered medications for this visit.   Current Outpatient Prescriptions  Medication Sig Dispense Refill  . HYDROcodone-acetaminophen (NORCO) 5-325 MG per tablet Take 1-2 tablets by mouth every 6 (six) hours as needed.  30 tablet   0   Facility-Administered Medications Ordered in Other Visits  Medication Dose Route Frequency Provider Last Rate Last Dose  . 0.9 %  sodium chloride infusion   Intravenous Continuous Newt Minion, MD 20 mL/hr at 06/23/13 0820    . albuterol (PROVENTIL HFA;VENTOLIN HFA) 108 (90 BASE) MCG/ACT inhaler 2 puff  2 puff Inhalation Q6H PRN Newt Minion, MD      . amiodarone (PACERONE) tablet 200 mg  200 mg Oral Daily Newt Minion, MD   200 mg at 06/24/13 0957  . aspirin chewable tablet 81 mg  81 mg Oral Daily Newt Minion, MD   81 mg at 06/24/13 0957  . atorvastatin (LIPITOR) tablet 10 mg  10 mg Oral q1800 Newt Minion, MD   10 mg at 06/23/13 1712  . brimonidine (ALPHAGAN) 0.2 % ophthalmic solution 1 drop  1 drop Both Eyes TID Newt Minion, MD   1 drop at 06/24/13 0959  . carbamazepine (TEGRETOL) tablet 200 mg  200 mg Oral BID Newt Minion, MD   200 mg at 06/24/13 0957  . carvedilol (COREG) tablet 25 mg  25 mg Oral BID WC Newt Minion, MD   25 mg at 06/24/13 0841  . cloNIDine (CATAPRES) tablet 0.2 mg  0.2 mg Oral BID Newt Minion, MD   0.2 mg at 06/24/13 0957  . colchicine tablet 0.6 mg  0.6 mg Oral BID PRN Newt Minion, MD      . dorzolamide-timolol (COSOPT) 22.3-6.8 MG/ML ophthalmic solution 1 drop  1 drop Both Eyes BID Newt Minion, MD   1 drop at 06/24/13 0959  . exenatide (BYETTA)  injection SOPN 10 mcg  10 mcg Subcutaneous BID WC Newt Minion, MD   10 mcg at 06/24/13 0841  . febuxostat (ULORIC) tablet 40 mg  40 mg Oral QHS Newt Minion, MD   40 mg at 06/23/13 2128  . furosemide (LASIX) tablet 40 mg  40 mg Oral BID Newt Minion, MD   40 mg at 06/24/13 0841  . HYDROmorphone (DILAUDID) injection 0.5-1 mg  0.5-1 mg Intravenous Q2H PRN Newt Minion, MD      . insulin aspart (novoLOG) injection 0-15 Units  0-15 Units Subcutaneous TID WC Newt Minion, MD   3 Units at 06/24/13 1239  . insulin aspart (novoLOG) injection 4 Units  4 Units Subcutaneous TID WC Newt Minion, MD   4 Units at  06/24/13 1239  . insulin aspart (novoLOG) injection 8 Units  8 Units Subcutaneous BID AC Newt Minion, MD   8 Units at 06/23/13 1710  . insulin glargine (LANTUS) injection 40 Units  40 Units Subcutaneous QHS Newt Minion, MD   40 Units at 06/23/13 2128  . isosorbide mononitrate (IMDUR) 24 hr tablet 30 mg  30 mg Oral Daily Newt Minion, MD   30 mg at 06/24/13 0957  . lisinopril (PRINIVIL,ZESTRIL) tablet 20 mg  20 mg Oral QHS Newt Minion, MD   20 mg at 06/23/13 2128  . metoCLOPramide (REGLAN) tablet 5-10 mg  5-10 mg Oral Q8H PRN Newt Minion, MD       Or  . metoCLOPramide (REGLAN) injection 5-10 mg  5-10 mg Intravenous Q8H PRN Newt Minion, MD      . Derrill Memo ON 06/25/2013] metolazone (ZAROXOLYN) tablet 2.5 mg  2.5 mg Oral 2 times weekly Newt Minion, MD      . ondansetron [facility]) tablet 4 mg  4 mg Oral Q6H PRN Newt Minion, MD       Or  . ondansetron [facility]) injection 4 mg  4 mg Intravenous Q6H PRN Newt Minion, MD      . oxyCODONE-acetaminophen (PERCOCET/ROXICET) 5-325 MG per tablet 1-2 tablet  1-2 tablet Oral Q4H PRN Newt Minion, MD   2 tablet at 06/23/13 2201  . pantoprazole (PROTONIX) EC tablet 40 mg  40 mg Oral QPM Newt Minion, MD   40 mg at 06/23/13 1712  . polyethylene glycol (MIRALAX / GLYCOLAX) packet 17 g  17 g Oral Daily Newt Minion, MD   17 g at 06/24/13 0957  . polyvinyl alcohol (LIQUIFILM TEARS) 1.4 % ophthalmic solution 1 drop  1 drop Both Eyes BID Newt Minion, MD   1 drop at 06/24/13 0959  . potassium chloride SA (K-DUR,KLOR-CON) CR tablet 20 mEq  20 mEq Oral BID Newt Minion, MD   20 mEq at 06/24/13 0957    Past Medical History  Diagnosis Date  . Diabetes type 2, uncontrolled   . Gout   . Hyperlipidemia   . Hypertension   . Obstructive sleep apnea     On CPAP. Last sleep study (06/2009) - Severe obstructive sleep apnea/hypopnea syndrome, apnea-hypopnea index 70.5 per hour with non positional events moderately loud snoring and oxygen desaturation to a nadir  of 85% on room air.  . Benign prostatic hypertrophy   . Morbid obesity   . Cardiomyopathy, hypertensive      diastolic dysfunction by [id number] echo  . Coronary artery disease      nonobstructive. 2 vessel. cardiac catheter in March 2005  showing 60% LAD occlusion, 30% circumflex occlusion.  . Pacemaker 2003-11-13     secondary to bradycardia  . Legally blind   . Herpes   . Occult blood positive stool      History of in August 2007.  colonoscopy in January 2008 showing normal colon with fair inadequate prep. By Dr. Silvano Rusk.  . Incarcerated ventral hernia     H/O. S/P repair ventral hernia repair 07/2005.  . Diabetic ulcer of thigh     H/O Rt thigh ulcer.  . Diabetic peripheral neuropathy   . Exertional shortness of breath     "comes and goes" (Jun 28, 2013)  . GERD (gastroesophageal reflux disease)   . Degenerative joint disease      Right knee.  . Arthritis     "all over" (06-28-13)    Past Surgical History  Procedure Laterality Date  . Pacemaker lead revision  12/2003     likely microdislodgment  of right ventricular lead  . Ventral hernia repair  07/2005     S/P laparotomy, lysis of adhesions, repair of incarcerated ventral hernia with  partial omentectomy.. Performed by Dr. Marlou Starks.  . Hernia repair    . Cataract extraction, bilateral Bilateral   . Refractive surgery Bilateral   . Eye surgery    . 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 Insert / replace / remove pacemaker  11-13-2003    Secondary to symptomatic bradycardia. DDDR pacemaker. By Dr. Rollene Fare.  . Insert / replace / remove pacemaker  2012/11/12    "old pacemaker died; had new one put in" (Jun 28, 2013)    Family History  Problem Relation Age of Onset  . Coronary artery disease Sister   . Heart disease Sister   . Allergies Sister   . Asthma Brother   . Rheum arthritis Brother     History   Social History  . Marital Status: Single    Spouse Name: N/A    Number of Children: Y    . Years of Education: N/A   Occupational History  . disabled    Social History Main Topics  . Smoking status: Former Smoker -- 0.00 packs/day for 0 years    Types: Cigarettes    Quit date: 08/17/2003  . Smokeless tobacco: Never Used     Comment: started at age 64.  only smoked on occ- very rarely.0  . Alcohol Use: Yes     Comment: 06/28/13 "drank some; never had a problem w/it"  . Drug Use: No  . Sexual Activity: No   Other Topics Concern  . Not on file   Social History Narrative  . No narrative on file    Review of systems: Left foot nonhealing ulcer with gangrene The patient specifically denies any chest pain at rest or with exertion, dyspnea at rest or with exertion, orthopnea, paroxysmal nocturnal dyspnea, syncope, palpitations, focal neurological deficits, intermittent claudication, lower extremity edema, unexplained weight gain, cough, hemoptysis or wheezing.  The patient also denies abdominal pain, nausea, vomiting, dysphagia, diarrhea, constipation, polyuria, polydipsia, dysuria, hematuria, frequency, urgency, abnormal bleeding or bruising, fever, chills, unexpected weight changes, mood swings, change in skin or hair texture, change in voice quality, auditory or visual problems, allergic reactions or rashes, new musculoskeletal complaints other than usual "aches and pains".   PHYSICAL EXAM BP 138/62  Pulse 72  Resp 20  Ht 6' (1.829 m)  Wt 302 lb (136.986 kg)  BMI 40.95 kg/m2 Echo exam is limited by morbid obesity  General: Alert, oriented x3, no distress Head: no evidence of trauma, PERRL, EOMI, no exophtalmos or lid lag, no myxedema, no xanthelasma; normal ears, nose and oropharynx Neck: Unable to evaluate jugular venous pulsations and no hepatojugular reflux; brisk carotid pulses without delay and no carotid bruits Chest: clear to auscultation, no signs of consolidation by percussion or palpation, normal fremitus, symmetrical and full respiratory  excursions Cardiovascular: Cannot identify the apical impulse, irregular rhythm, normal first and second heart sounds, no murmurs, rubs or gallops Abdomen: no tenderness or distention, no masses by palpation, no abnormal pulsatility or arterial bruits, normal bowel sounds, no hepatosplenomegaly Extremities: no clubbing, cyanosis or edema; 2+ radial, ulnar and brachial pulses bilaterally; 2+ right femoral, 1+posterior tibial and nonpalpable dorsalis pedis pulses; 2+ left femoral, nonpalpable posterior tibial and dorsalis pedis pulses; no subclavian or femoral bruits Neurological: grossly nonfocal   EKG: Atrial fibrillation with very coarse fibrillatory waves, possibly atrial flutter with variable AV conduction generalized low-voltage likely secondary to BCG unable to adequately evaluate repolarization changes due to the very high voltage atrial waves  Lipid Panel     Component Value Date/Time   CHOL 171 06/29/2010   TRIG 107 06/29/2010   HDL 32 06/29/2010   CHOLHDL 6.1 Ratio 05/15/2009 2055   VLDL 44* 05/15/2009 2055   LDLCALC 118 06/29/2010    BMET    Component Value Date/Time   NA 140 06/22/2013 1150   K 4.6 06/22/2013 1150   CL 102 06/22/2013 1150   CO2 28 06/22/2013 1150   GLUCOSE 175* 06/22/2013 1150   BUN 31* 06/22/2013 1150   CREATININE 1.45* 06/22/2013 1150   CREATININE 2.28* 04/23/2013 1216   CALCIUM 9.7 06/22/2013 1150   GFRNONAA 49* 06/22/2013 1150   GFRAA 57* 06/22/2013 1150     ASSESSMENT AND PLAN Coronary artery disease Widespread with nonobstructive lesions by angiography in 2005, normal nuclear stress test in 2011 (diaphragmatic attenuation artifact). Preserved left ventricular systolic function. Asymptomatic. Focus on risk factor modification.  Cardiomyopathy, hypertensive Focus on blood pressure control using primarily carvedilol and ACE inhibitors. Sodium restriction as recommended. Weight loss would be beneficial but from my discussion with him is unlikely to  occur.  Atrial fibrillation His atrial electrogram is at times looks rather organized consistent with atrial flutter, but overall I think he actually has atrial fibrillation. He is unfortunately apparently not a good candidate for warfarin anticoagulation. He does not appear that the arrhythmia has led to any change in functional status. I'm not sure that amiodarone therapy is actually beneficial beyond its effects for rate control. Since this is the first time that I met him I would like to not make any changes in therapy today, but we'll likely interrupted amiodarone especially she remains in persistent atrial fibrillation at his next visit in 3 months. Unfortunately his risk for cardioembolic stroke is substantial since he also has hypertension and diabetes mellitus. Am not sure to what degree his blindness and concerns about compliance of medication played in the decision to interrupt his anticoagulation. If this is the only concern, then we can prescribe a novel anticoagulant. If the rectal bleeding was a major cause for stopping the medication than aspirin appears to be the better choice.  Pacemaker Medtronic Adapta dual chamber device. Normal pacemaker function with persistent atrial fibrillation for the last approximately 4 months. Rate control is generally good. There is roughly 40% ventricular pacing. Normal lead parameters. Changed to nontracking mode. Ventricular lead pacing pulse width was adjusted today. Followup with  an in office pacemaker check in 3 months.    Patient Instructions  Your physician recommends that you schedule a follow-up appointment in: 3 months with pacemaker check   Take 2 Aspirin 81 mg daily.     Orders Placed This Encounter  Procedures  . Pacemaker Device Observation   Meds ordered this encounter  Medications  . amiodarone (PACERONE) 200 MG tablet    Sig: Take 200 mg by mouth daily.  Marland Kitchen doxycycline (VIBRAMYCIN) 100 MG capsule    Sig: Take 100 mg by mouth  2 (two) times daily.  . carbamazepine (TEGRETOL) 200 MG tablet    Sig: Take 200 mg by mouth 2 (two) times daily.  Marland Kitchen DISCONTD: acetaminophen (TYLENOL) 325 MG tablet    Sig: Take 650 mg by mouth every 6 (six) hours as needed for pain.    Holli Humbles, MD, [place] [phone] office [phone] pager

## 2013-06-24 NOTE — Progress Notes (Signed)
Pt refuses to wear CPAP tonight. PT is understands to call RN if he changes his mind. RT will continue to monitor.

## 2013-06-24 NOTE — Progress Notes (Signed)
Patient ID: [name], male   DOB: 03-Jul-1946, 67 y.o.   MRN: [id number] Doing well. Dressing looks clean and dry.  Can discharge today to [facility].

## 2013-06-24 NOTE — Assessment & Plan Note (Signed)
Focus on blood pressure control using primarily carvedilol and ACE inhibitors. Sodium restriction as recommended. Weight loss would be beneficial but from my discussion with him is unlikely to occur.

## 2013-06-24 NOTE — Assessment & Plan Note (Signed)
Medtronic Adapta dual chamber device. Normal pacemaker function with persistent atrial fibrillation for the last approximately 4 months. Rate control is generally good. There is roughly 40% ventricular pacing. Normal lead parameters. Changed to nontracking mode. Ventricular lead pacing pulse width was adjusted today. Followup with an in office pacemaker check in 3 months.

## 2013-06-25 ENCOUNTER — Telehealth: Payer: Self-pay | Admitting: *Deleted

## 2013-06-25 ENCOUNTER — Encounter (HOSPITAL_COMMUNITY): Payer: Self-pay | Admitting: Orthopedic Surgery

## 2013-06-25 NOTE — Progress Notes (Signed)
UR completed. Caydan Mctavish RN CCM Case Mgmt 

## 2013-06-25 NOTE — Telephone Encounter (Signed)
Pacemaker interrogation pres-surgical clearance.

## 2013-07-23 ENCOUNTER — Encounter: Payer: Self-pay | Admitting: Podiatry

## 2013-07-23 ENCOUNTER — Ambulatory Visit (INDEPENDENT_AMBULATORY_CARE_PROVIDER_SITE_OTHER): Payer: Medicare (Managed Care) | Admitting: Podiatry

## 2013-07-23 VITALS — BP 142/86 | HR 90 | Resp 12 | Ht 72.0 in | Wt 290.0 lb

## 2013-07-23 DIAGNOSIS — L97509 Non-pressure chronic ulcer of other part of unspecified foot with unspecified severity: Secondary | ICD-10-CM

## 2013-07-23 NOTE — Patient Instructions (Signed)
Apply all-purpose skin lotion right lower leg and foot daily. Wear a surgical shoe on right foot when walking. Reduce the amount of standing and walking is much is possible during therapy.

## 2013-07-23 NOTE — Progress Notes (Signed)
   Subjective:    Patient ID: [name], male    DOB: [birth year], 67 y.o.   MRN: [id number]  HPI Comments: PT. RT FOOT STATED THAT SOMETIMES IS HURTING, BUT COULD NOT REMEMBER WHEN THE ULCER OCCUR.   Patient is recovering from left forefoot amputation and is in rehabilitation. He describes some weightbearing during his rehabilitation sessions. He is under the care of Dr. Sharol Given for the left forefoot amputation and currently is walking in a Cam Walker boot with dressing changes to the left foot under the care of Dr. Sharol Given. He thinks that the skin lesion on the plantar aspect of right foot they have been treated or trim back by another doctor.   Review of Systems  Eyes: Positive for visual disturbance.       BLIND ON BOTH EYES  Respiratory: Positive for chest tightness, shortness of breath and wheezing.   Musculoskeletal: Positive for gait problem and joint swelling.  All other systems reviewed and are negative.       Objective:   Physical Exam  67 year old black male appears orientated x3, transfers from wheelchair the treatment chair.  Vascular: The DP and PT pulses are trace palpable bilaterally.  Neurological: Sensation to 10 g monofilament wire intact 5/10 locations right. Deferred on evaluation of left foot because patient has full bandage with Ace wrap around it post amputation.  Dermatological: Dry scaling skin noted on the right foot. Hemorrhagic hyperkeratotic lesion plantar fifth right MPJ noted after debridement remains closed. A full dressing is noted on the ankle and left foot and was left intact.        Assessment & Plan:   Assessment:  Pre-ulcerative hyperkeratotic lesion plantar fifth right MPJ Weight transfer to right foot from left foot amputation and Cam Walker boot  Plan: Debrided hyperkeratotic lesion plantar fifth right MPJ. Patient placed in rocker sole surgical shoe for the right foot. Advised patient to reduce the standing walking during  rehabilitation. Reevaluate in 2 weeks.

## 2013-08-06 ENCOUNTER — Ambulatory Visit: Payer: Medicare (Managed Care) | Admitting: Podiatry

## 2013-08-26 DIAGNOSIS — A419 Sepsis, unspecified organism: Secondary | ICD-10-CM

## 2013-08-26 HISTORY — DX: Sepsis, unspecified organism: A41.9

## 2013-08-27 ENCOUNTER — Encounter (HOSPITAL_COMMUNITY): Payer: Self-pay | Admitting: Emergency Medicine

## 2013-08-27 ENCOUNTER — Emergency Department (HOSPITAL_COMMUNITY): Payer: Medicare (Managed Care)

## 2013-08-27 ENCOUNTER — Inpatient Hospital Stay (HOSPITAL_COMMUNITY): Payer: Medicare (Managed Care)

## 2013-08-27 ENCOUNTER — Inpatient Hospital Stay (HOSPITAL_COMMUNITY)
Admission: EM | Admit: 2013-08-27 | Discharge: 2013-08-29 | DRG: 872 | Disposition: A | Discharge status: Home Health | Payer: Medicare (Managed Care) | Attending: Internal Medicine | Admitting: Internal Medicine

## 2013-08-27 DIAGNOSIS — M109 Gout, unspecified: Secondary | ICD-10-CM | POA: Diagnosis present

## 2013-08-27 DIAGNOSIS — J4489 Other specified chronic obstructive pulmonary disease: Secondary | ICD-10-CM | POA: Diagnosis present

## 2013-08-27 DIAGNOSIS — Z825 Family history of asthma and other chronic lower respiratory diseases: Secondary | ICD-10-CM

## 2013-08-27 DIAGNOSIS — N390 Urinary tract infection, site not specified: Secondary | ICD-10-CM | POA: Diagnosis present

## 2013-08-27 DIAGNOSIS — K219 Gastro-esophageal reflux disease without esophagitis: Secondary | ICD-10-CM | POA: Diagnosis present

## 2013-08-27 DIAGNOSIS — N183 Chronic kidney disease, stage 3 unspecified: Secondary | ICD-10-CM | POA: Diagnosis present

## 2013-08-27 DIAGNOSIS — Z8249 Family history of ischemic heart disease and other diseases of the circulatory system: Secondary | ICD-10-CM

## 2013-08-27 DIAGNOSIS — S98919A Complete traumatic amputation of unspecified foot, level unspecified, initial encounter: Secondary | ICD-10-CM

## 2013-08-27 DIAGNOSIS — I4891 Unspecified atrial fibrillation: Secondary | ICD-10-CM | POA: Diagnosis present

## 2013-08-27 DIAGNOSIS — E1149 Type 2 diabetes mellitus with other diabetic neurological complication: Secondary | ICD-10-CM | POA: Diagnosis present

## 2013-08-27 DIAGNOSIS — H3530 Unspecified macular degeneration: Secondary | ICD-10-CM | POA: Diagnosis present

## 2013-08-27 DIAGNOSIS — N189 Chronic kidney disease, unspecified: Secondary | ICD-10-CM | POA: Diagnosis present

## 2013-08-27 DIAGNOSIS — A419 Sepsis, unspecified organism: Principal | ICD-10-CM | POA: Diagnosis present

## 2013-08-27 DIAGNOSIS — M1710 Unilateral primary osteoarthritis, unspecified knee: Secondary | ICD-10-CM | POA: Diagnosis present

## 2013-08-27 DIAGNOSIS — I2510 Atherosclerotic heart disease of native coronary artery without angina pectoris: Secondary | ICD-10-CM | POA: Diagnosis present

## 2013-08-27 DIAGNOSIS — G4733 Obstructive sleep apnea (adult) (pediatric): Secondary | ICD-10-CM | POA: Diagnosis present

## 2013-08-27 DIAGNOSIS — Z950 Presence of cardiac pacemaker: Secondary | ICD-10-CM

## 2013-08-27 DIAGNOSIS — A599 Trichomoniasis, unspecified: Secondary | ICD-10-CM | POA: Diagnosis present

## 2013-08-27 DIAGNOSIS — I43 Cardiomyopathy in diseases classified elsewhere: Secondary | ICD-10-CM | POA: Diagnosis present

## 2013-08-27 DIAGNOSIS — Z79899 Other long term (current) drug therapy: Secondary | ICD-10-CM

## 2013-08-27 DIAGNOSIS — J111 Influenza due to unidentified influenza virus with other respiratory manifestations: Secondary | ICD-10-CM | POA: Diagnosis present

## 2013-08-27 DIAGNOSIS — E1165 Type 2 diabetes mellitus with hyperglycemia: Secondary | ICD-10-CM

## 2013-08-27 DIAGNOSIS — I480 Paroxysmal atrial fibrillation: Secondary | ICD-10-CM | POA: Diagnosis present

## 2013-08-27 DIAGNOSIS — Z87891 Personal history of nicotine dependence: Secondary | ICD-10-CM

## 2013-08-27 DIAGNOSIS — Z794 Long term (current) use of insulin: Secondary | ICD-10-CM

## 2013-08-27 DIAGNOSIS — Z7982 Long term (current) use of aspirin: Secondary | ICD-10-CM

## 2013-08-27 DIAGNOSIS — H548 Legal blindness, as defined in USA: Secondary | ICD-10-CM | POA: Diagnosis present

## 2013-08-27 DIAGNOSIS — I10 Essential (primary) hypertension: Secondary | ICD-10-CM | POA: Diagnosis present

## 2013-08-27 DIAGNOSIS — IMO0002 Reserved for concepts with insufficient information to code with codable children: Secondary | ICD-10-CM

## 2013-08-27 DIAGNOSIS — I129 Hypertensive chronic kidney disease with stage 1 through stage 4 chronic kidney disease, or unspecified chronic kidney disease: Secondary | ICD-10-CM | POA: Diagnosis present

## 2013-08-27 DIAGNOSIS — E785 Hyperlipidemia, unspecified: Secondary | ICD-10-CM | POA: Diagnosis present

## 2013-08-27 DIAGNOSIS — N453 Epididymo-orchitis: Secondary | ICD-10-CM | POA: Diagnosis present

## 2013-08-27 DIAGNOSIS — J449 Chronic obstructive pulmonary disease, unspecified: Secondary | ICD-10-CM | POA: Diagnosis present

## 2013-08-27 DIAGNOSIS — E0822 Diabetes mellitus due to underlying condition with diabetic chronic kidney disease: Secondary | ICD-10-CM | POA: Diagnosis present

## 2013-08-27 DIAGNOSIS — N179 Acute kidney failure, unspecified: Secondary | ICD-10-CM | POA: Diagnosis present

## 2013-08-27 DIAGNOSIS — E1142 Type 2 diabetes mellitus with diabetic polyneuropathy: Secondary | ICD-10-CM | POA: Diagnosis present

## 2013-08-27 HISTORY — DX: Sepsis, unspecified organism: A41.9

## 2013-08-27 LAB — CBC WITH DIFFERENTIAL/PLATELET
Basophils Absolute: 0 10*3/uL (ref 0.0–0.1)
Basophils Relative: 0 % (ref 0–1)
Eosinophils Absolute: 0.2 10*3/uL (ref 0.0–0.7)
Eosinophils Relative: 2 % (ref 0–5)
HEMATOCRIT: 39.4 % (ref 39.0–52.0)
HEMOGLOBIN: 12.8 g/dL — AB (ref 13.0–17.0)
LYMPHS ABS: 1.6 10*3/uL (ref 0.7–4.0)
Lymphocytes Relative: 13 % (ref 12–46)
MCH: 27.1 pg (ref 26.0–34.0)
MCHC: 32.5 g/dL (ref 30.0–36.0)
MCV: 83.5 fL (ref 78.0–100.0)
MONO ABS: 1.3 10*3/uL — AB (ref 0.1–1.0)
Monocytes Relative: 10 % (ref 3–12)
NEUTROS PCT: 75 % (ref 43–77)
Neutro Abs: 9.4 10*3/uL — ABNORMAL HIGH (ref 1.7–7.7)
Platelets: 257 10*3/uL (ref 150–400)
RBC: 4.72 MIL/uL (ref 4.22–5.81)
RDW: 14.5 % (ref 11.5–15.5)
WBC: 12.5 10*3/uL — ABNORMAL HIGH (ref 4.0–10.5)

## 2013-08-27 LAB — URINALYSIS, ROUTINE W REFLEX MICROSCOPIC
Bilirubin Urine: NEGATIVE
Glucose, UA: NEGATIVE mg/dL
KETONES UR: NEGATIVE mg/dL
NITRITE: NEGATIVE
PROTEIN: NEGATIVE mg/dL
Specific Gravity, Urine: 1.014 (ref 1.005–1.030)
Urobilinogen, UA: 0.2 mg/dL (ref 0.0–1.0)
pH: 5.5 (ref 5.0–8.0)

## 2013-08-27 LAB — URINE MICROSCOPIC-ADD ON

## 2013-08-27 LAB — COMPREHENSIVE METABOLIC PANEL
ALK PHOS: 134 U/L — AB (ref 39–117)
ALT: 62 U/L — ABNORMAL HIGH (ref 0–53)
AST: 24 U/L (ref 0–37)
Albumin: 2.9 g/dL — ABNORMAL LOW (ref 3.5–5.2)
BILIRUBIN TOTAL: 0.2 mg/dL — AB (ref 0.3–1.2)
BUN: 47 mg/dL — AB (ref 6–23)
CHLORIDE: 97 meq/L (ref 96–112)
CO2: 27 mEq/L (ref 19–32)
CREATININE: 2.8 mg/dL — AB (ref 0.50–1.35)
Calcium: 9.6 mg/dL (ref 8.4–10.5)
GFR calc Af Amer: 26 mL/min — ABNORMAL LOW (ref >90)
GFR calc non Af Amer: 22 mL/min — ABNORMAL LOW (ref >90)
Glucose, Bld: 228 mg/dL — ABNORMAL HIGH (ref 70–99)
POTASSIUM: 5.2 meq/L (ref 3.7–5.3)
Sodium: 138 mEq/L (ref 137–147)
Total Protein: 7.9 g/dL (ref 6.0–8.3)

## 2013-08-27 LAB — POCT I-STAT TROPONIN I: TROPONIN I, POC: 0 ng/mL (ref 0.00–0.08)

## 2013-08-27 LAB — GLUCOSE, CAPILLARY: GLUCOSE-CAPILLARY: 167 mg/dL — AB (ref 70–99)

## 2013-08-27 LAB — CG4 I-STAT (LACTIC ACID): LACTIC ACID, VENOUS: 2.03 mmol/L (ref 0.5–2.2)

## 2013-08-27 MED ORDER — SODIUM CHLORIDE 0.9 % IJ SOLN: 3.0000 mL | Freq: Two times a day (BID) | INTRAMUSCULAR | Status: DC

## 2013-08-27 MED ORDER — SODIUM CHLORIDE 0.9 % IV SOLN: 250.0000 mL | INTRAVENOUS | Status: DC | PRN

## 2013-08-27 MED ORDER — ONDANSETRON HCL 4 MG PO TABS: 4.0000 mg | ORAL_TABLET | Freq: Four times a day (QID) | ORAL | Status: DC | PRN

## 2013-08-27 MED ORDER — VANCOMYCIN HCL IN DEXTROSE 1-5 GM/200ML-% IV SOLN
1000.0000 mg | Freq: Once | INTRAVENOUS | Status: DC
  Filled 2013-08-27: qty 200

## 2013-08-27 MED ORDER — PANTOPRAZOLE SODIUM 40 MG PO TBEC
80.0000 mg | DELAYED_RELEASE_TABLET | Freq: Every day | ORAL | Status: DC
  Administered 2013-08-28 – 2013-08-29 (×2): 80 mg via ORAL
  Filled 2013-08-27 (×2): qty 2

## 2013-08-27 MED ORDER — HEPARIN SODIUM (PORCINE) 5000 UNIT/ML IJ SOLN
5000.0000 [IU] | Freq: Three times a day (TID) | INTRAMUSCULAR | Status: DC
  Administered 2013-08-27: 5000 [IU] via SUBCUTANEOUS
  Filled 2013-08-27 (×5): qty 1

## 2013-08-27 MED ORDER — ASPIRIN 81 MG PO CHEW: 81.0000 mg | CHEWABLE_TABLET | Freq: Every day | ORAL | Status: DC

## 2013-08-27 MED ORDER — DORZOLAMIDE HCL-TIMOLOL MAL 2-0.5 % OP SOLN
1.0000 [drp] | Freq: Two times a day (BID) | OPHTHALMIC | Status: DC
  Administered 2013-08-27 – 2013-08-29 (×4): 1 [drp] via OPHTHALMIC
  Filled 2013-08-27: qty 10

## 2013-08-27 MED ORDER — AZITHROMYCIN 1 G PO PACK: 1.0000 g | PACK | Freq: Once | ORAL | Status: DC

## 2013-08-27 MED ORDER — ONDANSETRON HCL 4 MG/2ML IJ SOLN: 4.0000 mg | Freq: Four times a day (QID) | INTRAMUSCULAR | Status: DC | PRN

## 2013-08-27 MED ORDER — SODIUM CHLORIDE 0.9 % IV SOLN
Freq: Once | INTRAVENOUS | Status: AC
  Administered 2013-08-27: 17:00:00 via INTRAVENOUS

## 2013-08-27 MED ORDER — METRONIDAZOLE IVPB CUSTOM
2.0000 g | Freq: Once | INTRAVENOUS | Status: AC
  Administered 2013-08-28: 01:00:00 2 g via INTRAVENOUS
  Filled 2013-08-27: qty 400

## 2013-08-27 MED ORDER — ALBUTEROL SULFATE (2.5 MG/3ML) 0.083% IN NEBU
2.5000 mg | INHALATION_SOLUTION | Freq: Four times a day (QID) | RESPIRATORY_TRACT | Status: DC | PRN
  Administered 2013-08-29: 2.5 mg via RESPIRATORY_TRACT
  Filled 2013-08-27: qty 3

## 2013-08-27 MED ORDER — INSULIN ASPART 100 UNIT/ML ~~LOC~~ SOLN
0.0000 [IU] | Freq: Three times a day (TID) | SUBCUTANEOUS | Status: DC
Start: 2013-08-28 — End: 2013-08-29
  Administered 2013-08-28: 2 [IU] via SUBCUTANEOUS
  Administered 2013-08-28: 1 [IU] via SUBCUTANEOUS
  Administered 2013-08-28: 2 [IU] via SUBCUTANEOUS
  Administered 2013-08-29 (×2): 1 [IU] via SUBCUTANEOUS

## 2013-08-27 MED ORDER — BRIMONIDINE TARTRATE 0.2 % OP SOLN
1.0000 [drp] | Freq: Three times a day (TID) | OPHTHALMIC | Status: DC
  Administered 2013-08-28 – 2013-08-29 (×6): 1 [drp] via OPHTHALMIC
  Filled 2013-08-27: qty 5

## 2013-08-27 MED ORDER — OXYCODONE HCL 5 MG PO TABS
5.0000 mg | ORAL_TABLET | ORAL | Status: DC | PRN
  Administered 2013-08-28: 5 mg via ORAL
  Filled 2013-08-27: qty 1

## 2013-08-27 MED ORDER — VANCOMYCIN HCL 10 G IV SOLR
2500.0000 mg | Freq: Once | INTRAVENOUS | Status: AC
  Administered 2013-08-27: 2500 mg via INTRAVENOUS
  Filled 2013-08-27: qty 2500

## 2013-08-27 MED ORDER — METHYLCELLULOSE 1 % OP SOLN: 1.0000 [drp] | Freq: Two times a day (BID) | OPHTHALMIC | Status: DC

## 2013-08-27 MED ORDER — PIPERACILLIN-TAZOBACTAM 3.375 G IVPB 30 MIN
3.3750 g | Freq: Once | INTRAVENOUS | Status: AC
Start: 2013-08-27 — End: 2013-08-27
  Administered 2013-08-27: 3.375 g via INTRAVENOUS
  Filled 2013-08-27: qty 50

## 2013-08-27 MED ORDER — SODIUM CHLORIDE 0.9 % IV BOLUS (SEPSIS)
1000.0000 mL | Freq: Once | INTRAVENOUS | Status: AC
  Administered 2013-08-27: 1000 mL via INTRAVENOUS

## 2013-08-27 MED ORDER — POLYVINYL ALCOHOL 1.4 % OP SOLN
1.0000 [drp] | Freq: Two times a day (BID) | OPHTHALMIC | Status: DC
  Filled 2013-08-27: qty 15

## 2013-08-27 MED ORDER — ALBUTEROL SULFATE HFA 108 (90 BASE) MCG/ACT IN AERS
2.0000 | INHALATION_SPRAY | Freq: Four times a day (QID) | RESPIRATORY_TRACT | Status: DC | PRN
Start: 2013-08-27 — End: 2013-08-27

## 2013-08-27 MED ORDER — ACETAMINOPHEN 325 MG PO TABS
650.0000 mg | ORAL_TABLET | Freq: Once | ORAL | Status: AC
  Administered 2013-08-27: 650 mg via ORAL
  Filled 2013-08-27: qty 2

## 2013-08-27 MED ORDER — INSULIN GLARGINE 100 UNIT/ML ~~LOC~~ SOLN
26.0000 [IU] | Freq: Every day | SUBCUTANEOUS | Status: DC
  Administered 2013-08-27 – 2013-08-28 (×2): 26 [IU] via SUBCUTANEOUS
  Filled 2013-08-27 (×3): qty 0.26

## 2013-08-27 MED ORDER — AMIODARONE HCL 200 MG PO TABS
200.0000 mg | ORAL_TABLET | Freq: Every day | ORAL | Status: DC
  Administered 2013-08-28 – 2013-08-29 (×2): 200 mg via ORAL
  Filled 2013-08-27 (×2): qty 1

## 2013-08-27 MED ORDER — FEBUXOSTAT 40 MG PO TABS
40.0000 mg | ORAL_TABLET | Freq: Every day | ORAL | Status: DC
  Administered 2013-08-27 – 2013-08-28 (×2): 40 mg via ORAL
  Filled 2013-08-27 (×3): qty 1

## 2013-08-27 MED ORDER — SODIUM CHLORIDE 0.9 % IV SOLN
INTRAVENOUS | Status: AC
  Administered 2013-08-28: via INTRAVENOUS

## 2013-08-27 MED ORDER — PIPERACILLIN-TAZOBACTAM 3.375 G IVPB
3.3750 g | Freq: Three times a day (TID) | INTRAVENOUS | Status: DC
  Administered 2013-08-28 (×2): 3.375 g via INTRAVENOUS
  Filled 2013-08-27 (×3): qty 50

## 2013-08-27 MED ORDER — GUAIFENESIN 100 MG/5ML PO SOLN
5.0000 mL | Freq: Every evening | ORAL | Status: DC | PRN
  Filled 2013-08-27: qty 5

## 2013-08-27 MED ORDER — VITAMIN D3 25 MCG (1000 UNIT) PO TABS
1000.0000 [IU] | ORAL_TABLET | Freq: Every day | ORAL | Status: DC
  Administered 2013-08-27 – 2013-08-28 (×2): 1000 [IU] via ORAL
  Filled 2013-08-27 (×3): qty 1

## 2013-08-27 MED ORDER — SALINE SPRAY 0.65 % NA SOLN
1.0000 | NASAL | Status: DC | PRN
  Filled 2013-08-27: qty 44

## 2013-08-27 MED ORDER — CARVEDILOL 25 MG PO TABS
25.0000 mg | ORAL_TABLET | Freq: Two times a day (BID) | ORAL | Status: DC
  Administered 2013-08-28 – 2013-08-29 (×3): 25 mg via ORAL
  Filled 2013-08-27 (×5): qty 1

## 2013-08-27 MED ORDER — SODIUM CHLORIDE 0.9 % IJ SOLN: 3.0000 mL | INTRAMUSCULAR | Status: DC | PRN

## 2013-08-27 MED ORDER — CARBAMAZEPINE 200 MG PO TABS
200.0000 mg | ORAL_TABLET | Freq: Two times a day (BID) | ORAL | Status: DC
  Administered 2013-08-27 – 2013-08-29 (×4): 200 mg via ORAL
  Filled 2013-08-27 (×5): qty 1

## 2013-08-27 MED ORDER — POLYETHYLENE GLYCOL 3350 17 G PO PACK
17.0000 g | PACK | Freq: Every day | ORAL | Status: DC
  Administered 2013-08-28 – 2013-08-29 (×2): 17 g via ORAL
  Filled 2013-08-27 (×2): qty 1

## 2013-08-27 MED ORDER — AZITHROMYCIN 500 MG PO TABS
1000.0000 mg | ORAL_TABLET | Freq: Once | ORAL | Status: AC
  Administered 2013-08-28: 1000 mg via ORAL
  Filled 2013-08-27: qty 2

## 2013-08-27 MED ORDER — SODIUM CHLORIDE 0.9 % IJ SOLN
3.0000 mL | Freq: Two times a day (BID) | INTRAMUSCULAR | Status: DC
  Administered 2013-08-29: 3 mL via INTRAVENOUS

## 2013-08-27 MED ORDER — VANCOMYCIN HCL 10 G IV SOLR
1750.0000 mg | INTRAVENOUS | Status: DC
  Filled 2013-08-27: qty 1750

## 2013-08-27 MED ORDER — OMEPRAZOLE MAGNESIUM 20 MG PO TBEC: 40.0000 mg | DELAYED_RELEASE_TABLET | Freq: Every evening | ORAL | Status: DC

## 2013-08-27 MED ORDER — BRIMONIDINE TARTRATE 0.15 % OP SOLN
1.0000 [drp] | Freq: Three times a day (TID) | OPHTHALMIC | Status: DC
  Filled 2013-08-27: qty 5

## 2013-08-27 NOTE — ED Provider Notes (Signed)
CSN: [id number]     Arrival date & time 08/27/13  11/27/13 History   First MD Initiated Contact with Patient 08/27/13 1624     Chief Complaint  Patient presents with  . Hypotension   (Consider location/radiation/quality/duration/timing/severity/associated sxs/prior Treatment) Patient is a 68 y.o. male presenting with altered mental status.  Altered Mental Status Presenting symptoms: partial responsiveness   Severity:  Mild Most recent episode:  Today Progression:  Worsening Chronicity:  New Context comment:  At [facility] clinic Associated symptoms: light-headedness   Associated symptoms: no abdominal pain, no fever, no headaches, no nausea, no rash, no seizures and no vomiting   Associated symptoms comment:  Diaphoresis   Past Medical History  Diagnosis Date  . Diabetes type 2, uncontrolled   . Gout   . Hyperlipidemia   . Hypertension   . Obstructive sleep apnea     On CPAP. Last sleep study (06/2009) - Severe obstructive sleep apnea/hypopnea syndrome, apnea-hypopnea index 70.5 per hour with non positional events moderately loud snoring and oxygen desaturation to a nadir of 85% on room air.  . Benign prostatic hypertrophy   . Morbid obesity   . Cardiomyopathy, hypertensive      diastolic dysfunction by [id number] echo  . Coronary artery disease      nonobstructive. 2 vessel. cardiac catheter in 2003-11-28 showing 60% LAD occlusion, 30% circumflex occlusion.  . 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 Legally blind   . Herpes   . Occult blood positive stool      History of in August 2007.  colonoscopy in January 2008 showing normal colon with fair inadequate prep. By Dr. Silvano Rusk.  . Incarcerated ventral hernia     H/O. S/P repair ventral hernia repair 07/2005.  . Diabetic ulcer of thigh     H/O Rt thigh ulcer.  . Diabetic peripheral neuropathy   . Exertional shortness of breath     "comes and goes" (Jul 13, 2013)  . GERD (gastroesophageal reflux disease)   . Degenerative  joint disease      Right knee.  . Arthritis     "all over" (13-Jul-2013)   Past Surgical History  Procedure Laterality Date  . Pacemaker lead revision  12/2003     likely microdislodgment  of right ventricular lead  . Ventral hernia repair  07/2005     S/P laparotomy, lysis of adhesions, repair of incarcerated ventral hernia with  partial omentectomy.. Performed by Dr. Marlou Starks.  . Hernia repair    . Cataract extraction, bilateral Bilateral   . Refractive surgery Bilateral   . Eye surgery    . 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 Insert / replace / remove pacemaker  2003-11-28    Secondary to symptomatic bradycardia. DDDR pacemaker. By Dr. Rollene Fare.  . Insert / replace / remove pacemaker  November 27, 2012    "old pacemaker died; had new one put in" (07-13-13)  . Amputation Left 06/22/2013    Procedure: AMPUTATION FOOT;  Surgeon: Newt Minion, MD;  Location: [place];  Service: Orthopedics;  Laterality: Left;  Left Midfoot Amputation   Family History  Problem Relation Age of Onset  . Coronary artery disease Sister   . Heart disease Sister   . Allergies Sister   . Asthma Brother   . Rheum arthritis Brother    History  Substance Use Topics  . Smoking status: Former Smoker -- 0.00 packs/day for 0 years    Types: Cigarettes  Quit date: 08/17/2003  . Smokeless tobacco: Never Used     Comment: started at age 89.  only smoked on occ- very rarely.0  . Alcohol Use: Yes     Comment: 06/23/2013 "drank some; never had a problem w/it"    Review of Systems  Constitutional: Positive for chills and fatigue. Negative for fever.  HENT: Negative for sore throat.   Eyes: Negative for pain.  Respiratory: Negative for cough and shortness of breath.   Cardiovascular: Negative for chest pain.  Gastrointestinal: Negative for nausea, vomiting, abdominal pain and abdominal distention.  Genitourinary: Negative for dysuria and flank pain.  Musculoskeletal: Positive for  myalgias. Negative for back pain and neck pain.  Skin: Negative for rash.  Neurological: Positive for light-headedness. Negative for seizures and headaches.    Allergies  Review of patient's allergies indicates no known allergies.  Home Medications   No current outpatient prescriptions on file. BP 125/83  Pulse 84  Temp(Src) 97.7 F (36.5 C) (Oral)  Resp 20  Ht 6' (1.829 m)  Wt 218 lb 14.4 oz (99.292 kg)  BMI 29.68 kg/m2  SpO2 98% Physical Exam  Constitutional: He appears well-developed and well-nourished. He appears lethargic. No distress.  HENT:  Head: Normocephalic and atraumatic.  Eyes: Pupils are equal, round, and reactive to light.  Neck: Normal range of motion.  Cardiovascular: Normal rate and regular rhythm.   Pulmonary/Chest: Effort normal and breath sounds normal.  Abdominal: Soft. He exhibits distension (obese). There is no tenderness.  Musculoskeletal: Normal range of motion.  Neurological: He has normal strength. He appears lethargic. GCS eye subscore is 3. GCS verbal subscore is 5. GCS motor subscore is 6.  Skin: Skin is warm. He is not diaphoretic.    ED Course  Procedures (including critical care time) Labs Review Labs Reviewed  CBC WITH DIFFERENTIAL - Abnormal; Notable for the following:    WBC 12.5 (*)    Hemoglobin 12.8 (*)    Neutro Abs 9.4 (*)    Monocytes Absolute 1.3 (*)    All other components within normal limits  COMPREHENSIVE METABOLIC PANEL - Abnormal; Notable for the following:    Glucose, Bld 228 (*)    BUN 47 (*)    Creatinine, Ser 2.80 (*)    Albumin 2.9 (*)    ALT 62 (*)    Alkaline Phosphatase 134 (*)    Total Bilirubin 0.2 (*)    GFR calc non Af Amer 22 (*)    GFR calc Af Amer 26 (*)    All other components within normal limits  URINALYSIS, ROUTINE W REFLEX MICROSCOPIC - Abnormal; Notable for the following:    APPearance CLOUDY (*)    Hgb urine dipstick LARGE (*)    Leukocytes, UA LARGE (*)    All other components within  normal limits  URINE MICROSCOPIC-ADD ON - Abnormal; Notable for the following:    Bacteria, UA FEW (*)    All other components within normal limits  GLUCOSE, CAPILLARY - Abnormal; Notable for the following:    Glucose-Capillary 167 (*)    All other components within normal limits  URINE CULTURE  CULTURE, BLOOD (ROUTINE X 2)  CULTURE, BLOOD (ROUTINE X 2)  GC/CHLAMYDIA PROBE AMP  BASIC METABOLIC PANEL  CBC  INFLUENZA PANEL BY PCR (TYPE A & B, H1N1)  HIV ANTIBODY (ROUTINE TESTING)  CG4 I-STAT (LACTIC ACID)  POCT I-STAT TROPONIN I   Imaging Review Dg Chest Port 1 View  08/27/2013   CLINICAL DATA:  Hypotension  EXAM: PORTABLE CHEST - 1 VIEW  COMPARISON:  10/03/2012  FINDINGS: The heart remains moderately enlarged. Mediastinum remains prominent. No pneumothorax or pleural effusion. 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  IMPRESSION: Cardiomegaly without pulmonary edema.   Electronically Signed   By: Maryclare Bean M.D.   On: 08/27/2013 17:01    EKG Interpretation    Date/Time:    Ventricular Rate:    PR Interval:    QRS Duration:   QT Interval:    QTC Calculation:   R Axis:     Text Interpretation:              MDM   1. Sepsis     Wojciech Rus is a 68 y.o. male who presents to the ED with hypotension, altered mental status, concerning for sepsis 2/2 UTI (patient being treated with cipro).   Multiple labs drawn on the patient. Patient with vital sign abnormalities including hypotension, tachypnea and lab abnormalities, c/w sepsis. Antibiotics started on the patient. Fluid resuscitation started on the patient, with improvement in blood pressure, mental status. Will admit to hospitalist for continued monitoring and workup.   Patient with mild leukocytosis, leukocytes in urinalysis. Patient will be admitted to the internal medicine service. Admitted in stable condition, with improvement/stabilization of BP s/p 2 L NS bolus. Broad spectrum antibiotics  given for unknown source of infection. Patient with surgical site of L toe amputations, without evidence of infection. Patient seen and evaluated by myself and my attending, Dr. Kathrynn Humble.       Freddi Che, MD 08/28/13 [phone]

## 2013-08-27 NOTE — ED Notes (Signed)
Lab results reported to EDP. 

## 2013-08-27 NOTE — H&P (Signed)
Date: 08/28/2013               Patient Name:  [name] MRN: [id number]  DOB: [birth year] Age / Sex: 68 y.o., male   PCP: [name], MD           Medical Service: Internal Medicine Teaching Service         Attending Physician: Dr. Sid Falcon, MD    First Contact: Dr. Rebecca Eaton, MD Pager: [phone]  Second Contact: Dr. Jerene Pitch, MD Pager: [phone]       After Hours (After 5p/  First Contact Pager: [phone]  weekends / holidays): Second Contact Pager: [phone]    Most Recent Discharge Date:  06/24/13  Chief Complaint:  Chief Complaint  Patient presents with  . Hypotension       History of present illness: Pt is a 68 y.o. 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  [facility].  Pt reports that [facility] called [facility] because he was hypotensive and unable to urinate.  He reports having fever/chills, SOB, sore throat, and productive cough that began yesterday morning.  He denies any associated presyncope, myalgias, CP, N/V/D/C, abdominal pain, or urinary symptoms.  He denies any sick contacts.  He reports sexual activity over 3 months ago and inconsistent condom use.    Upon arrival in ED, he was hypotensive at 89/59 and received 2L NS bolus and  vancomycin/zosyn.    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      Meds: Current Facility-Administered Medications  Medication Dose Route Frequency Provider Last Rate Last Dose  . 0.9 %  sodium chloride infusion   Intravenous Continuous Blain Pais, MD 75 mL/hr at 08/28/13 0001    . albuterol (PROVENTIL) (2.5 MG/3ML) 0.083% nebulizer solution 2.5 mg  2.5 mg Nebulization Q6H PRN Sid Falcon, MD      . amiodarone (PACERONE) tablet 200 mg  200 mg Oral Daily Blain Pais, MD      . aspirin chewable tablet 162 mg  162 mg Oral Daily Blain Pais, MD      . azithromycin [facility]) tablet 1,000 mg  1,000 mg Oral Once Sid Falcon, MD      . brimonidine (ALPHAGAN) 0.2 % ophthalmic solution 1 drop  1 drop Both Eyes TID Sid Falcon, MD   1 drop at 08/28/13 0056  . carbamazepine (TEGRETOL) tablet 200 mg  200 mg Oral BID Blain Pais, MD   200 mg at 08/27/13 2355  . carvedilol (COREG) tablet 25 mg  25 mg Oral BID WC Blain Pais, MD      . cholecalciferol (VITAMIN D) tablet 1,000 Units  1,000 Units Oral QHS Blain Pais, MD   1,000 Units at 08/27/13 2355  . dorzolamide-timolol (COSOPT) 22.3-6.8 MG/ML ophthalmic solution 1 drop  1 drop Both Eyes BID Blain Pais, MD   1 drop at 08/27/13 2356  . febuxostat (ULORIC) tablet 40 mg  40 mg Oral QHS Blain Pais, MD   40 mg at 08/27/13 2355  . guaiFENesin (ROBITUSSIN) 100 MG/5ML solution 100 mg  5 mL Oral QHS PRN Blain Pais, MD      . heparin  injection 5,000 Units  5,000 Units Subcutaneous Q8H Blain Pais, MD   5,000 Units at 08/27/13 2356  . insulin aspart (novoLOG) injection 0-9 Units  0-9 Units Subcutaneous TID WC Blain Pais, MD      . insulin glargine (LANTUS) injection 26 Units  26 Units Subcutaneous QHS Blain Pais, MD   26 Units at 08/27/13 2356  . ondansetron (ZOFRAN) tablet 4 mg  4 mg Oral Q6H PRN Blain Pais, MD       Or  . ondansetron (ZOFRAN) injection 4 mg  4 mg Intravenous Q6H PRN Blain Pais, MD      . oxyCODONE (Oxy IR/ROXICODONE) immediate release tablet 5 mg  5 mg Oral Q4H PRN Blain Pais, MD      . pantoprazole (PROTONIX) EC tablet 80 mg  80 mg Oral Daily Sid Falcon, MD      . piperacillin-tazobactam (ZOSYN) IVPB 3.375 g  3.375 g Intravenous Q8H Rande Lawman Rumbarger, RPH   3.375 g at 08/28/13 0056  .  polyethylene glycol (MIRALAX / GLYCOLAX) packet 17 g  17 g Oral Daily Blain Pais, MD      . polyvinyl alcohol (LIQUIFILM TEARS) 1.4 % ophthalmic solution 1 drop  1 drop Both Eyes BID Sid Falcon, MD      . sodium chloride (OCEAN) 0.65 % nasal spray 1 spray  1 spray Each Nare PRN Blain Pais, MD      . sodium chloride 0.9 % injection 3 mL  3 mL Intravenous Q12H Blain Pais, MD      . vancomycin (VANCOCIN) 1,750 mg in sodium chloride 0.9 % 500 mL IVPB  1,750 mg Intravenous Q24H Rande Lawman Rumbarger, [facility]        Prescriptions prior to admission  Medication Sig Dispense Refill  . albuterol (PROVENTIL HFA;VENTOLIN HFA) 108 (90 BASE) MCG/ACT inhaler Inhale 2 puffs into the lungs every 6 (six) hours as needed for wheezing.      Marland Kitchen amiodarone (PACERONE) 200 MG tablet Take 200 mg by mouth daily.      Marland Kitchen aspirin 81 MG chewable tablet Chew 81 mg by mouth daily.      . brimonidine (ALPHAGAN P) 0.1 % SOLN Place 1 drop into both eyes every 8 (eight) hours.      . carbamazepine (TEGRETOL) 200 MG tablet Take 200 mg by mouth 2 (two) times daily.      . carvedilol (COREG) 25 MG tablet Take 25 mg by mouth 2 (two) times daily with a meal.       . cholecalciferol (VITAMIN D) 1000 UNITS tablet Take 1,000 Units by mouth at bedtime.      . cloNIDine (CATAPRES) 0.2 MG tablet Take 0.2 mg by mouth 2 (two) times daily.      . colchicine 0.6 MG tablet Take 0.6 mg by mouth 2 (two) times daily as needed (for gout flare-ups).       . dorzolamide-timolol (COSOPT) 22.3-6.8 MG/ML ophthalmic solution Place 1 drop into both eyes 2 (two) times daily.      Marland Kitchen exenatide (BYETTA) 10 MCG/0.04ML SOPN injection Inject 10 mcg into the skin 2 (two) times daily with a meal.      . febuxostat (ULORIC) 40 MG tablet Take 40 mg by mouth at bedtime.      . furosemide (LASIX) 40 MG tablet Take 40 mg by mouth 2 (two) times daily.       Marland Kitchen HYDROcodone-acetaminophen (NORCO) 5-325 MG per  tablet Take 1-2 tablets by mouth every  6 (six) hours as needed.  30 tablet  0  . insulin aspart (NOVOLOG) 100 UNIT/ML injection Inject 8 Units into the skin 2 (two) times daily before a meal.       . insulin glargine (LANTUS) 100 UNIT/ML injection Inject 40 Units into the skin at bedtime.       . isosorbide mononitrate (IMDUR) 30 MG 24 hr tablet Take 30 mg by mouth daily.      Marland Kitchen lisinopril (PRINIVIL,ZESTRIL) 20 MG tablet Take 20 mg by mouth at bedtime.       . magnesium oxide (MAG-OX) 400 MG tablet Take 400 mg by mouth daily.      . methylcellulose (ARTIFICIAL TEARS) 1 % ophthalmic solution Place 1 drop into both eyes 2 (two) times daily.      . metolazone (ZAROXOLYN) 2.5 MG tablet Take 2.5 mg by mouth 2 (two) times a week. Takes on Tuesdays and Fridays.      Marland Kitchen omeprazole (PRILOSEC OTC) 20 MG tablet Take 20 mg by mouth every evening.       . polyethylene glycol (MIRALAX / GLYCOLAX) packet Take 17 g by mouth daily.       . potassium chloride SA (K-DUR,KLOR-CON) 20 MEQ tablet Take 20 mEq by mouth 2 (two) times daily.       . rosuvastatin (CRESTOR) 40 MG tablet Take 40 mg by mouth every evening.      Marland Kitchen glucose blood test strip 1 each by Other route as needed. Use as instructed to check blood sugar before meals and at bedtime at least 2-3 time daily      . Insulin Pen Needle 31G X 8 MM MISC Use to inject insulin 2x daily before breakfast and dinner.        Allergies: Allergies as of 08/27/2013  . (No Known Allergies)    PMH: Past Medical History  Diagnosis Date  . Diabetes type 2, uncontrolled   . Gout   . Hyperlipidemia   . Hypertension   . Obstructive sleep apnea     On CPAP. Last sleep study (06/2009) - Severe obstructive sleep apnea/hypopnea syndrome, apnea-hypopnea index 70.5 per hour with non positional events moderately loud snoring and oxygen desaturation to a nadir of 85% on room air.  . Benign prostatic hypertrophy   . Morbid obesity   . Cardiomyopathy, hypertensive      diastolic dysfunction by 6629 echo  .  Coronary artery disease      nonobstructive. 2 vessel. cardiac catheter in March 2005 showing 60% LAD occlusion, 30% circumflex occlusion.  . 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 Legally blind   . Herpes   . Occult blood positive stool      History of in August 2007.  colonoscopy in January 2008 showing normal colon with fair inadequate prep. By Dr. Silvano Rusk.  . Incarcerated ventral hernia     H/O. S/P repair ventral hernia repair 07/2005.  . Diabetic ulcer of thigh     H/O Rt thigh ulcer.  . Diabetic peripheral neuropathy   . Exertional shortness of breath     "comes and goes" (06/23/2013)  . GERD (gastroesophageal reflux disease)   . Degenerative joint disease      Right knee.  . Arthritis     "all over" (06/23/2013)    PSH: Past Surgical History  Procedure Laterality Date  . Pacemaker lead revision  12/2003     likely  microdislodgment  of right ventricular lead  . Ventral hernia repair  07/2005     S/P laparotomy, lysis of adhesions, repair of incarcerated ventral hernia with  partial omentectomy.. Performed by Dr. Marlou Starks.  . Hernia repair    . Cataract extraction, bilateral Bilateral   . Refractive surgery Bilateral   . Eye surgery    . 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 Insert / replace / remove pacemaker  Nov 15, 2003    Secondary to symptomatic bradycardia. DDDR pacemaker. By Dr. Rollene Fare.  . Insert / replace / remove pacemaker  14-Nov-2012    "old pacemaker died; had new one put in" (07/16/13)  . Amputation Left 06/22/2013    Procedure: AMPUTATION FOOT;  Surgeon: Newt Minion, MD;  Location: [facility];  Service: Orthopedics;  Laterality: Left;  Left Midfoot Amputation    FH: Family History  Problem Relation Age of Onset  . Coronary artery disease Sister   . Heart disease Sister   . Allergies Sister   . Asthma Brother   . Rheum arthritis Brother     SH: History  Substance Use Topics  . Smoking status: Former  Smoker -- 0.00 packs/day for 0 years    Types: Cigarettes    Quit date: 08/17/2003  . Smokeless tobacco: Never Used     Comment: started at age 39.  only smoked on occ- very rarely.0  . Alcohol Use: Yes     Comment: 2013/07/16 "drank some; never had a problem w/it"    Review of Systems: Pertinent items are noted in HPI.  Physical Exam: Blood pressure 163/84, pulse 69, temperature 97.9 F (36.6 C), temperature source Oral, resp. rate 18, height 6' (1.829 m), weight 99.292 kg (218 lb 14.4 oz), SpO2 95.00%.  Physical Exam  Constitutional: He is oriented to person, place, and time and well-developed, well-nourished, and in no distress.  HENT:  Head: Normocephalic and atraumatic.  Mouth/Throat: Oropharynx is clear and moist.  Eyes: EOM are normal. Pupils are equal, round, and reactive to light.  Neck: Normal range of motion. Neck supple.  Cardiovascular: Normal rate, regular rhythm, normal heart sounds and intact distal pulses.  Exam reveals no gallop and no friction rub.   No murmur heard. Pulmonary/Chest: Effort normal and breath sounds normal. No respiratory distress. He has no wheezes. He has no rales. He exhibits no tenderness.  Abdominal: Soft. Bowel sounds are normal. He exhibits no distension. There is no tenderness. There is no rebound.  Genitourinary: He exhibits scrotal tenderness. No discharge found.  Edematous scrotum with erythema and exquisitely tender to palpation  Neurological: He is alert and oriented to person, place, and time. No cranial nerve deficit.  Skin: Skin is warm, dry and intact.  Extremities: 2+DP b/l; trace pitting edema b/l; chronic venous stasis b/l; left foot transmetatarsal amputation   Lab results:  Basic Metabolic Panel:  Recent Labs  08/27/13 1632  NA 138  K 5.2  CL 97  CO2 27  GLUCOSE 228*  BUN 47*  CREATININE 2.80*  CALCIUM 9.6   Anion Gap:  14  Liver Function Tests:  Recent Labs  08/27/13 1632  AST 24  ALT 62*  ALKPHOS 134*    BILITOT 0.2*  PROT 7.9  ALBUMIN 2.9*   No results found for this basename: LIPASE, AMYLASE,  in the last 72 hours No results found for this basename: AMMONIA,  in the last 72 hours  CBC:    Component Value Date/Time  WBC 12.5* 08/27/2013 1632   RBC 4.72 08/27/2013 1632   HGB 12.8* 08/27/2013 1632   HCT 39.4 08/27/2013 1632   PLT 257 08/27/2013 1632   MCV 83.5 08/27/2013 1632   MCH 27.1 08/27/2013 1632   MCHC 32.5 08/27/2013 1632   RDW 14.5 08/27/2013 1632   LYMPHSABS 1.6 08/27/2013 1632   MONOABS 1.3* 08/27/2013 1632   EOSABS 0.2 08/27/2013 1632   BASOSABS 0.0 08/27/2013 1632    Cardiac Enzymes:  Recent Labs  08/27/13 1641  TROPIPOC 0.00   Lab Results  Component Value Date   CKTOTAL 160 08/21/2008   CKMB 2.4 08/21/2008   TROPONINI  Value: 0.02        NO INDICATION OF MYOCARDIAL INJURY. 08/21/2008    BNP: No results found for this basename: PROBNP,  in the last 72 hours  D-Dimer: No results found for this basename: DDIMER,  in the last 72 hours  CBG:  Recent Labs  08/27/13 2259  GLUCAP 167*    Hemoglobin A1C: No results found for this basename: HGBA1C,  in the last 72 hours  Lipid Panel: No results found for this basename: CHOL, HDL, LDLCALC, TRIG, CHOLHDL, LDLDIRECT,  in the last 72 hours  Thyroid Function Tests: No results found for this basename: TSH, T4TOTAL, FREET4, T3FREE, THYROIDAB,  in the last 72 hours  Anemia Panel: No results found for this basename: VITAMINB12, FOLATE, FERRITIN, TIBC, IRON, RETICCTPCT,  in the last 72 hours  Coagulation: No results found for this basename: LABPROT, INR,  in the last 72 hours  Urine Drug Screen: Drugs of Abuse:     Component Value Date/Time   LABOPIA NEG 12/16/2008 2246   COCAINSCRNUR NEG 12/16/2008 2246   LABBENZ NEG 12/16/2008 2246   AMPHETMU NEG 12/16/2008 2246    Alcohol Level: No results found for this basename: ETH,  in the last 72 hours  Urinalysis:  08/27/2013   Ref. Range 08/27/2013 16:43 08/27/2013 16:44  08/27/2013 16:53 08/27/2013 17:15 08/27/2013 17:25  Color, Urine Latest Range: YELLOW   YELLOW     APPearance Latest Range: CLEAR   CLOUDY (A)     Specific Gravity, Urine Latest Range: 1.005-1.030   1.014     pH Latest Range: 5.0-8.0   5.5     Glucose Latest Range: NEGATIVE mg/dL  NEGATIVE     Bilirubin Urine Latest Range: NEGATIVE   NEGATIVE     Ketones, ur Latest Range: NEGATIVE mg/dL  NEGATIVE     Protein Latest Range: NEGATIVE mg/dL  NEGATIVE     Urobilinogen, UA Latest Range: 0.0-1.0 mg/dL  0.2     Nitrite Latest Range: NEGATIVE   NEGATIVE     Leukocytes, UA Latest Range: NEGATIVE   LARGE (A)     Hgb urine dipstick Latest Range: NEGATIVE   LARGE (A)     Urine-Other No range found  TRICHOMONAS PRESENT     WBC, UA Latest Range: <3 WBC/hpf  7-10     RBC / HPF Latest Range: <3 RBC/hpf  TOO NUMEROUS TO COUNT     Squamous Epithelial / LPF Latest Range: RARE   RARE     Bacteria, UA Latest Range: RARE   FEW (A)       Imaging results:  US Scrotum  08/28/2013   CLINICAL DATA:  Edematous and erythematous scrotum. Current history of diabetes.  EXAM: SCROTAL ULTRASOUND  DOPPLER ULTRASOUND OF THE TESTICLES  TECHNIQUE: Complete ultrasound examination of the testicles, epididymis, and other scrotal structures was performed.  Color and spectral Doppler ultrasound were also utilized to evaluate blood flow to the testicles.  COMPARISON:  None.  FINDINGS: Right testicle  Measurements: Approximately 3.6 x 1.7 x 2.5 cm. No focal parenchymal abnormality. Mild hyperemia on color Doppler evaluation.  Left testicle  Measurements: Approximately 3.1 x 1.9 x 3.3 cm. No focal parenchymal abnormality. Marked hyperemia on color Doppler evaluation.  Right epididymis: Normal in size and appearance. No evidence of hyperemia on color Doppler evaluation.  Left epididymis: Normal in size containing an approximate 1.1 x 0.7 cm cyst or spermatocele. No evidence of hyperemia on color Doppler evaluation.  Hydrocele:  None visualized.   Varicocele:  None visualized.  Pulsed Doppler interrogation of both testes demonstrates normal low resistance arterial and venous waveforms bilaterally.  IMPRESSION: 1. No evidence of testicular torsion. 2. Hyperemia involving both testicles, left greater than right, consistent with orchitis. 3. No evidence of epididymitis. 4. Cyst or spermatocele involving the left epididymal head.   Electronically Signed   By: Hulan Saas M.D.   On: 08/28/2013 00:13   Dg Chest Port 1 View  08/27/2013   CLINICAL DATA:  Hypotension  EXAM: PORTABLE CHEST - 1 VIEW  COMPARISON:  10/03/2012  FINDINGS: The heart remains moderately enlarged. Mediastinum remains prominent. No pneumothorax or pleural effusion. 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  IMPRESSION: Cardiomegaly without pulmonary edema.   Electronically Signed   By: Maryclare Bean M.D.   On: 08/27/2013 17:01    EKG: Date/Time:  08/27/13 16:25   Ventricular Rate:   75    PR Interval:   259 QRS Duration:  90 QT Interval:   401 QTC Calculation:  448 R Axis:    Normal Text Interpretation:  Sinus/ectopic atrial rhythm, prolonged PR, anteroseptal infarct, old  TTE 01/01/2013: Study Conclusions - Left ventricle: The cavity size was mildly reduced. Wall thickness was increased in a pattern of moderate LVH. There was moderate concentric hypertrophy. Systolic function was normal. The estimated ejection fraction was in the range of 50% to 55%. Wall motion was normal; there were no regional wall motion abnormalities. Doppler parameters are consistent with abnormal left ventricular relaxation (grade 1 diastolic dysfunction). - Ventricular septum: Thickness was moderately increased. - Aortic valve: Thickening. There was mild stenosis. Valve area: 1.27cm^2(VTI). Valve area: 1.34cm^2 (Vmax). - Mitral valve: Valve area by pressure half-time: 2.39cm^2. Valve area by continuity equation (using LVOT flow): 1.72cm^2. - Atrial septum: No defect or  patent foramen ovale was identified.    Antibiotics: Antibiotics Given (last 72 hours)   Date/Time Action Medication Dose Rate   08/28/13 0056 Given   piperacillin-tazobactam (ZOSYN) IVPB 3.375 g 3.375 g 12.5 mL/hr   08/28/13 0056 Given   metroNIDAZOLE (FLAGYL) IVPB 2 g 2 g 400 mL/hr      Anti-infectives   Start     Dose/Rate Route Frequency Ordered Stop   08/28/13 2000  vancomycin (VANCOCIN) 1,750 mg in sodium chloride 0.9 % 500 mL IVPB     1,750 mg 250 mL/hr over 120 Minutes Intravenous Every 24 hours 08/27/13 1804     08/28/13 0700  azithromycin (ZITHROMAX) tablet 1,000 mg     1,000 mg Oral  Once 08/27/13 2256     08/28/13 0600  azithromycin (ZITHROMAX) powder 1 g  Status:  Discontinued     1 g Oral  Once 08/27/13 2249 08/27/13 2256   08/28/13 0000  piperacillin-tazobactam (ZOSYN) IVPB 3.375 g     3.375 g 12.5 mL/hr over 240 Minutes  Intravenous Every 8 hours 08/27/13 1804     08/27/13 2300  metroNIDAZOLE (FLAGYL) IVPB 2 g     2 g 400 mL/hr over 60 Minutes Intravenous  Once 08/27/13 2247 08/28/13 0156   08/27/13 1800  piperacillin-tazobactam (ZOSYN) IVPB 3.375 g     3.375 g 100 mL/hr over 30 Minutes Intravenous  Once 08/27/13 1746 08/27/13 1818   08/27/13 1800  vancomycin (VANCOCIN) IVPB 1000 mg/200 mL premix  Status:  Discontinued     1,000 mg 200 mL/hr over 60 Minutes Intravenous  Once 08/27/13 1746 08/27/13 1753   08/27/13 1800  vancomycin (VANCOCIN) 2,500 mg in sodium chloride 0.9 % 500 mL IVPB     2,500 mg 250 mL/hr over 120 Minutes Intravenous  Once 08/27/13 1754 08/27/13 2042      SIRS/Sepsis/Septic Shock criteria met: Sepsis   Consults:    Assessment & Plan by Problem: Principal Problem:   Sepsis Active Problems:   Type 2 diabetes mellitus, uncontrolled   MACULAR DEGENERATION   HYPERTENSION   COPD (chronic obstructive pulmonary disease)   Coronary artery disease   OSA (obstructive sleep apnea)   Atrial fibrillation   Trichomonas infection    Epididymo-orchitis, acute  Pt is a 68 y.o. morbidly obese male with a PMH of DMII uncontrolled; gout; hyperlipidemia; HTN; OSA; BPH; cardiomyopathy, hypertensive; CAD; pacemaker; macular degeneration.  [facility].    #Sepsis- Pt initially met sepsis criteria with hypotension, leukocytosis, RR, and UA suggestive of UTI.  Hypotension resolved with IVF.  Pt reports fever/chills this AM but denies any urinary symptoms or penile discharge.  He also reports URI symptoms.  Possible sepsis secondary to UTI.  Uncontrolled DMII is a risk factor for UTI combined with BPH.  Portable CXR revealed cardiomegaly without pulmonary edema.   -Urine c&s  -BC  -influenza panel -Vancomycin/zosyn per pharmacy (narrow based on urine c&s)  #?Epidymitis/Orchitis- Pt completely asymptomatic.  Physical exam reveals scrotal edema, erythema with exquisite tenderness to palpation.  [place] of scrotum reveals no evidence of testicular torsion. 2. Hyperemia involving both testicles, left greater than right, consistent with orchitis. 3. No evidence of epididymitis. 4. Cyst or spermatocele involving the left epididymal head.  Most common cause is gonorrhea/chlamydia.  In men over 12 the most common cause is UTI.  Additionally, bladder outlet obstruction due to BPH or recent catheterization of the urethra. In any of these cases, the original infection may not cause symptoms, and the first sign of a problem may be epididymitis.   -ice packs as needed -scrotal support -workup as above and STI workup  #Trichomonas- Pt completely asymptomatic.  Pt reports last sexual activity 3 months ago with inconsistent condom use.  Advised to practice safe sexual practices and sexual partners should seek treatment.  -metronidazole 2g IV -check HIV, gonorrhea/chlamydia, RPR  #AKI- Pt with increased creatinine compared to baseline.  Pt on lasix for home med.  Bun/creatinine ratio: 17-unlikely prerenal.  According to pt  he was unable to void [facility] AM-so post-renal is possible or intrarenal.  -d/c lasix, lisinopril in setting of AKI -bladder scan -catherize PRN -strict I/O -avoid nephrotoxic agents  -NS$RemoveBe'@75ml'[id number]$ /hr for 10 hrs -spot urine urea  -spot urine sodium -renal US -consider d/c febuxostat   #Atrial fibrillation- Currently NSR; followed by Dr. Sallyanne Kuster.  CHADS2: 3; not on coumadin d/t rectal bleeding.    -continue amiodorone -continue ASA $RemoveBefor'[id number]$    #COPD- Stable; not on home O2.  -continue home meds  #  Uncontrolled DMII- last HA1c in Epic was 01/21/2011: 11.5.   -continue 40 units lantus (home dose) -SSI-sensitive  #Hypertension- -hold antihypertensives in setting of AKI -consider restarting lower dose clonidine in order to prevent rebound HTN if held -continue coreg  #OSA- -continue Bipap   #FEN- NS-64ml/hr for 10 hrs  Electrolytes-Replete as needed.  Diet-carb modified    #VTE prophylaxis- 5000 Units Heparin SQ tid   #Dispo- Disposition is deferred at this time, awaiting improvement of current medical problems.  Anticipated discharge in approximately 1-2 day(s).    Emergency Contact: Contact Information   Name [place] Daughter   [phone]   [facility] Daughter   [phone]   Frankey Poot   [phone]      The patient does have a current PCP [name], MD) and does need an [facility] hospital follow-up appointment after discharge.  Signed: Michail Jewels, MD PGY-1, Internal Medicine  08/28/2013, 2:01 AM

## 2013-08-27 NOTE — Progress Notes (Signed)
ANTIBIOTIC CONSULT NOTE - INITIAL  Pharmacy Consult for vancomycin + zosyn Indication: rule out sepsis  No Known Allergies  Patient Measurements: Height: 6' (182.9 cm) Weight: 290 lb (131.543 kg) IBW/kg (Calculated) : 77.6 Adjusted Body Weight:   Vital Signs: Temp: 100.2 F (37.9 C) (01/12 1635) Temp src: Rectal (01/12 1635) BP: 111/64 mmHg (01/12 1745) Pulse Rate: 70 (01/12 1745) Intake/Output from previous day:   Intake/Output from this shift:    Labs:  Recent Labs  08/27/13 1632  WBC 12.5*  HGB 12.8*  PLT 257  CREATININE 2.80*   Estimated Creatinine Clearance: 36.9 ml/min (by C-G formula based on Cr of 2.8). No results found for this basename: VANCOTROUGH, VANCOPEAK, VANCORANDOM, GENTTROUGH, GENTPEAK, GENTRANDOM, TOBRATROUGH, TOBRAPEAK, TOBRARND, AMIKACINPEAK, AMIKACINTROU, AMIKACIN,  in the last 72 hours   Microbiology: No results found for this or any previous visit (from the past 720 hour(s)).  Medical History: Past Medical History  Diagnosis Date  . Diabetes type 2, uncontrolled   . Gout   . Hyperlipidemia   . Hypertension   . Obstructive sleep apnea     On CPAP. Last sleep study (06/2009) - Severe obstructive sleep apnea/hypopnea syndrome, apnea-hypopnea index 70.5 per hour with non positional events moderately loud snoring and oxygen desaturation to a nadir of 85% on room air.  . Benign prostatic hypertrophy   . Morbid obesity   . Cardiomyopathy, hypertensive      diastolic dysfunction by [id number] echo  . Coronary artery disease      nonobstructive. 2 vessel. cardiac catheter in March 2005 showing 60% LAD occlusion, 30% circumflex occlusion.  . 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 Legally blind   . Herpes   . Occult blood positive stool      History of in August 2007.  colonoscopy in January 2008 showing normal colon with fair inadequate prep. By Dr. Silvano Rusk.  . Incarcerated ventral hernia     H/O. S/P repair ventral hernia repair  07/2005.  . Diabetic ulcer of thigh     H/O Rt thigh ulcer.  . Diabetic peripheral neuropathy   . Exertional shortness of breath     "comes and goes" (06/23/2013)  . GERD (gastroesophageal reflux disease)   . Degenerative joint disease      Right knee.  . Arthritis     "all over" (06/23/2013)    Medications:  Anti-infectives   Start     Dose/Rate Route Frequency Ordered Stop   08/28/13 2000  vancomycin (VANCOCIN) 1,750 mg in sodium chloride 0.9 % 500 mL IVPB     1,750 mg 250 mL/hr over 120 Minutes Intravenous Every 24 hours 08/27/13 1804     08/28/13 0000  piperacillin-tazobactam (ZOSYN) IVPB 3.375 g     3.375 g 12.5 mL/hr over 240 Minutes Intravenous Every 8 hours 08/27/13 1804     08/27/13 1800  piperacillin-tazobactam (ZOSYN) IVPB 3.375 g     3.375 g 100 mL/hr over 30 Minutes Intravenous  Once 08/27/13 1746     08/27/13 1800  vancomycin (VANCOCIN) IVPB 1000 mg/200 mL premix  Status:  Discontinued     1,000 mg 200 mL/hr over 60 Minutes Intravenous  Once 08/27/13 1746 08/27/13 1753   08/27/13 1800  vancomycin (VANCOCIN) 2,500 mg in sodium chloride 0.9 % 500 mL IVPB     2,500 mg 250 mL/hr over 120 Minutes Intravenous  Once 08/27/13 1754       Assessment: 59 yom presented from MD office with sepsis.  To start broad-spectrum antibiotics. Tmax is 100.2 and WBC is elevated at 12.5. Scr is also elevated at 2.8.   Goal of Therapy:  Vancomycin trough level 15-20 mcg/ml  Plan:  1. Vancomycin 2500mg  IV x 1 then 1750mg  IV Q24H 2. Zosyn 3.375gm IV Q8H (4 hr inf) 3. F/u renal fxn, C&S, clinical status and trough at [facility], Rande Lawman 08/27/2013,6:05 PM

## 2013-08-27 NOTE — ED Notes (Signed)
Portable xray at bedside.

## 2013-08-27 NOTE — ED Notes (Addendum)
Per EMS - pt coming from doctors office, [facility]. WBC 12, has been there since noon. Started to decline around 330pm. Temp 100. Treating for a UTI. No hives. BP 60/29. HR 90 NSR. RR 24. CBG 213. Has rightsided pacemaker. Pt has bilateral lower extremities in boots. 20 G in right hand. Administered 250 ml of NS. Pupils dilated. diaphoretic on scene.

## 2013-08-28 ENCOUNTER — Encounter (HOSPITAL_COMMUNITY): Payer: Self-pay | Admitting: General Practice

## 2013-08-28 ENCOUNTER — Inpatient Hospital Stay (HOSPITAL_COMMUNITY): Payer: Medicare (Managed Care)

## 2013-08-28 DIAGNOSIS — A599 Trichomoniasis, unspecified: Secondary | ICD-10-CM

## 2013-08-28 DIAGNOSIS — J449 Chronic obstructive pulmonary disease, unspecified: Secondary | ICD-10-CM

## 2013-08-28 DIAGNOSIS — E1165 Type 2 diabetes mellitus with hyperglycemia: Secondary | ICD-10-CM

## 2013-08-28 DIAGNOSIS — I10 Essential (primary) hypertension: Secondary | ICD-10-CM

## 2013-08-28 DIAGNOSIS — I4891 Unspecified atrial fibrillation: Secondary | ICD-10-CM

## 2013-08-28 DIAGNOSIS — N179 Acute kidney failure, unspecified: Secondary | ICD-10-CM

## 2013-08-28 DIAGNOSIS — IMO0001 Reserved for inherently not codable concepts without codable children: Secondary | ICD-10-CM

## 2013-08-28 DIAGNOSIS — J111 Influenza due to unidentified influenza virus with other respiratory manifestations: Secondary | ICD-10-CM

## 2013-08-28 DIAGNOSIS — A419 Sepsis, unspecified organism: Principal | ICD-10-CM

## 2013-08-28 DIAGNOSIS — N189 Chronic kidney disease, unspecified: Secondary | ICD-10-CM

## 2013-08-28 LAB — BASIC METABOLIC PANEL
BUN: 42 mg/dL — ABNORMAL HIGH (ref 6–23)
CHLORIDE: 98 meq/L (ref 96–112)
CO2: 27 mEq/L (ref 19–32)
Calcium: 9.3 mg/dL (ref 8.4–10.5)
Creatinine, Ser: 2.22 mg/dL — ABNORMAL HIGH (ref 0.50–1.35)
GFR calc Af Amer: 34 mL/min — ABNORMAL LOW (ref >90)
GFR, EST NON AFRICAN AMERICAN: 29 mL/min — AB (ref >90)
GLUCOSE: 149 mg/dL — AB (ref 70–99)
POTASSIUM: 4.3 meq/L (ref 3.7–5.3)
Sodium: 138 mEq/L (ref 137–147)

## 2013-08-28 LAB — CREATININE, URINE, RANDOM: CREATININE, URINE: 100.27 mg/dL

## 2013-08-28 LAB — CBC
HEMATOCRIT: 38.1 % — AB (ref 39.0–52.0)
HEMOGLOBIN: 12.3 g/dL — AB (ref 13.0–17.0)
MCH: 26.3 pg (ref 26.0–34.0)
MCHC: 32.3 g/dL (ref 30.0–36.0)
MCV: 81.4 fL (ref 78.0–100.0)
Platelets: 255 10*3/uL (ref 150–400)
RBC: 4.68 MIL/uL (ref 4.22–5.81)
RDW: 14.6 % (ref 11.5–15.5)
WBC: 9.6 10*3/uL (ref 4.0–10.5)

## 2013-08-28 LAB — GLUCOSE, CAPILLARY
GLUCOSE-CAPILLARY: 176 mg/dL — AB (ref 70–99)
GLUCOSE-CAPILLARY: 228 mg/dL — AB (ref 70–99)
GLUCOSE-CAPILLARY: 185 mg/dL — AB (ref 70–99)
Glucose-Capillary: 136 mg/dL — ABNORMAL HIGH (ref 70–99)
Glucose-Capillary: 194 mg/dL — ABNORMAL HIGH (ref 70–99)

## 2013-08-28 LAB — HEMOGLOBIN A1C
Hgb A1c MFr Bld: 8.2 % — ABNORMAL HIGH (ref <5.7)
Mean Plasma Glucose: 189 mg/dL — ABNORMAL HIGH (ref <117)

## 2013-08-28 LAB — UREA NITROGEN, URINE: UREA NITROGEN UR: 811 mg/dL

## 2013-08-28 LAB — HIV ANTIBODY (ROUTINE TESTING W REFLEX): HIV: NONREACTIVE

## 2013-08-28 LAB — RPR: RPR Ser Ql: NONREACTIVE

## 2013-08-28 LAB — INFLUENZA PANEL BY PCR (TYPE A & B)
H1N1 flu by pcr: NOT DETECTED
Influenza A By PCR: POSITIVE — AB
Influenza B By PCR: NEGATIVE

## 2013-08-28 MED ORDER — CIPROFLOXACIN HCL 250 MG PO TABS
250.0000 mg | ORAL_TABLET | Freq: Two times a day (BID) | ORAL | Status: DC
  Administered 2013-08-29: 250 mg via ORAL
  Filled 2013-08-28 (×3): qty 1

## 2013-08-28 MED ORDER — HEPARIN SODIUM (PORCINE) 5000 UNIT/ML IJ SOLN
5000.0000 [IU] | Freq: Three times a day (TID) | INTRAMUSCULAR | Status: DC
  Filled 2013-08-28 (×7): qty 1

## 2013-08-28 MED ORDER — OSELTAMIVIR PHOSPHATE 30 MG PO CAPS
30.0000 mg | ORAL_CAPSULE | Freq: Two times a day (BID) | ORAL | Status: DC
  Administered 2013-08-28 – 2013-08-29 (×3): 30 mg via ORAL
  Filled 2013-08-28 (×4): qty 1

## 2013-08-28 MED ORDER — ASPIRIN 81 MG PO CHEW
162.0000 mg | CHEWABLE_TABLET | Freq: Every day | ORAL | Status: DC
  Administered 2013-08-28 – 2013-08-29 (×2): 162 mg via ORAL
  Filled 2013-08-28 (×2): qty 2

## 2013-08-28 MED ORDER — DEXTROSE 5 % IV SOLN
1.0000 g | INTRAVENOUS | Status: DC
  Administered 2013-08-28: 1 g via INTRAVENOUS
  Filled 2013-08-28: qty 10

## 2013-08-28 NOTE — Progress Notes (Signed)
Patient stated he doesn't feel short of breath at this time and stated he doesn't need a breathing treatment at this time.

## 2013-08-28 NOTE — Progress Notes (Signed)
Subjective: Patient still having pain to scrotum. Persistent URI symptoms. No other complaints. Blood pressure has improved.  Objective: Vital signs in last 24 hours: Filed Vitals:   08/28/13 0049 08/28/13 0457 08/28/13 0800 08/28/13 1100  BP: 163/84 165/83 169/72 132/78  Pulse: 69 72 76 70  Temp: 97.9 F (36.6 C) 98.1 F (36.7 C) 98.8 F (37.1 C) 98.9 F (37.2 C)  TempSrc: Oral Oral Oral Oral  Resp: 18 18 20 19   Height:      Weight:      SpO2: 95% 98% 98% 92%   Weight change:   Intake/Output Summary (Last 24 hours) at 08/28/13 1606 Last data filed at 08/28/13 1000  Gross per 24 hour  Intake      0 ml  Output    650 ml  Net   -650 ml   Physical Exam General: alert, cooperative, and in no apparent distress; patient appears drowsy HEENT: NCAT, vision grossly intact Neck: supple Lungs: mild expiratory wheezing throughout bilateral lung fields Heart: regular rate and rhythm, no murmurs, gallops, or rubs Abdomen: soft, non-tender, non-distended, normal bowel sounds GU: scrotum is edematous and somewhat TTP Extremities: no BLE edema, warm extremities; +chronic venous changes to skin on BLE; 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 Neurologic: alert & oriented X3, moving all extremities spontaneously   Lab Results: Basic Metabolic Panel:  Recent Labs Lab 08/27/13 1632 08/28/13 0405  NA 138 138  K 5.2 4.3  CL 97 98  CO2 27 27  GLUCOSE 228* 149*  BUN 47* 42*  CREATININE 2.80* 2.22*  CALCIUM 9.6 9.3   Liver Function Tests:  Recent Labs Lab 08/27/13 1632  AST 24  ALT 62*  ALKPHOS 134*  BILITOT 0.2*  PROT 7.9  ALBUMIN 2.9*   CBC:  Recent Labs Lab 08/27/13 1632 08/28/13 0405  WBC 12.5* 9.6  NEUTROABS 9.4*  --   HGB 12.8* 12.3*  HCT 39.4 38.1*  MCV 83.5 81.4  PLT 257 255   CBG:  Recent Labs Lab 08/27/13 2259 08/28/13 0756 08/28/13 1156 08/28/13 1543  GLUCAP 167* 136* 176* 228*   Hemoglobin A1C:  Recent Labs Lab 08/28/13 0838    HGBA1C 8.2*   Urinalysis:  Recent Labs Lab 08/27/13 1644  COLORURINE YELLOW  LABSPEC 1.014  PHURINE 5.5  GLUCOSEU NEGATIVE  HGBUR LARGE*  BILIRUBINUR NEGATIVE  KETONESUR NEGATIVE  PROTEINUR NEGATIVE  UROBILINOGEN 0.2  NITRITE NEGATIVE  LEUKOCYTESUR LARGE*   Misc. Labs: HIV and RPR nonreactive  Micro Results: Recent Results (from the past 240 hour(s))  CULTURE, BLOOD (ROUTINE X 2)     Status: None   Collection Time    08/27/13  5:15 PM      Result Value Range Status   Specimen Description BLOOD ARM LEFT   Final   Special Requests BOTTLES DRAWN AEROBIC AND ANAEROBIC 10CC   Final   Culture  Setup Time     Final   Value: 08/27/2013 21:57     Performed at [facility]   Culture     Final   Value:        BLOOD CULTURE RECEIVED NO GROWTH TO DATE CULTURE WILL BE HELD FOR 5 DAYS BEFORE ISSUING A FINAL NEGATIVE REPORT     Performed at [facility]   Report Status PENDING   Incomplete  CULTURE, BLOOD (ROUTINE X 2)     Status: None   Collection Time    08/27/13  5:25 PM      Result  Value Range Status   Specimen Description BLOOD HAND LEFT   Final   Special Requests     Final   Value: BOTTLES DRAWN AEROBIC AND ANAEROBIC 10CCBLUE 5CCRED   Culture  Setup Time     Final   Value: 08/27/2013 21:57     Performed at [facility]   Culture     Final   Value:        BLOOD CULTURE RECEIVED NO GROWTH TO DATE CULTURE WILL BE HELD FOR 5 DAYS BEFORE ISSUING A FINAL NEGATIVE REPORT     Performed at [facility]   Report Status PENDING   Incomplete   Studies/Results: US Scrotum  08/28/2013   CLINICAL DATA:  Edematous and erythematous scrotum. Current history of diabetes.  EXAM: SCROTAL ULTRASOUND  DOPPLER ULTRASOUND OF THE TESTICLES  TECHNIQUE: Complete ultrasound examination of the testicles, epididymis, and other scrotal structures was performed. Color and spectral Doppler ultrasound were also utilized to evaluate blood flow to the testicles.  COMPARISON:   None.  FINDINGS: Right testicle  Measurements: Approximately 3.6 x 1.7 x 2.5 cm. No focal parenchymal abnormality. Mild hyperemia on color Doppler evaluation.  Left testicle  Measurements: Approximately 3.1 x 1.9 x 3.3 cm. No focal parenchymal abnormality. Marked hyperemia on color Doppler evaluation.  Right epididymis: Normal in size and appearance. No evidence of hyperemia on color Doppler evaluation.  Left epididymis: Normal in size containing an approximate 1.1 x 0.7 cm cyst or spermatocele. No evidence of hyperemia on color Doppler evaluation.  Hydrocele:  None visualized.  Varicocele:  None visualized.  Pulsed Doppler interrogation of both testes demonstrates normal low resistance arterial and venous waveforms bilaterally.  IMPRESSION: 1. No evidence of testicular torsion. 2. Hyperemia involving both testicles, left greater than right, consistent with orchitis. 3. No evidence of epididymitis. 4. Cyst or spermatocele involving the left epididymal head.   Electronically Signed   By: Evangeline Dakin M.D.   On: 08/28/2013 00:13   US Renal  08/28/2013   CLINICAL DATA:  Rule out hydronephrosis  EXAM: RENAL/URINARY TRACT ULTRASOUND COMPLETE  COMPARISON:  Renal ultrasound 03/20/2013  FINDINGS: Right Kidney:  Length: 12.8 cm. 2.2 cm midpole cyst. Echogenicity within normal limits. No mass or hydronephrosis visualized.  Left Kidney:  Length: 12.8 cm. 2.1 cm lower pole cyst. Echogenicity within normal limits. No mass or hydronephrosis visualized.  Bladder:  Appears normal for degree of bladder distention.  Gallstones are noted.  IMPRESSION: Normal renal size.  No obstruction.  Cholelithiasis.   Electronically Signed   By: Franchot Gallo M.D.   On: 08/28/2013 08:35   Dg Chest Port 1 View  08/27/2013   CLINICAL DATA:  Hypotension  EXAM: PORTABLE CHEST - 1 VIEW  COMPARISON:  10/03/2012  FINDINGS: The heart remains moderately enlarged. Mediastinum remains prominent. No pneumothorax or pleural effusion. Stable dual  lead right subclavian pacemaker device and leads.  IMPRESSION: Cardiomegaly without pulmonary edema.   Electronically Signed   By: Maryclare Bean M.D.   On: 08/27/2013 17:01   Medications: I have reviewed the patient's current medications. Scheduled Meds: . amiodarone  200 mg Oral Daily  . aspirin  162 mg Oral Daily  . brimonidine  1 drop Both Eyes TID  . carbamazepine  200 mg Oral BID  . carvedilol  25 mg Oral BID WC  . cholecalciferol  1,000 Units Oral QHS  . [START ON 08/29/2013] ciprofloxacin  250 mg Oral BID  . dorzolamide-timolol  1 drop Both Eyes BID  .  febuxostat  40 mg Oral QHS  . heparin  5,000 Units Subcutaneous Q8H  . insulin aspart  0-9 Units Subcutaneous TID WC  . insulin glargine  26 Units Subcutaneous QHS  . oseltamivir  30 mg Oral BID  . pantoprazole  80 mg Oral Daily  . polyethylene glycol  17 g Oral Daily  . polyvinyl alcohol  1 drop Both Eyes BID  . sodium chloride  3 mL Intravenous Q12H   Continuous Infusions:  PRN Meds:.albuterol, guaiFENesin, ondansetron (ZOFRAN) IV, ondansetron, oxyCODONE, sodium chloride Assessment/Plan:  #Sepsis 2/2 UTI vs orchitis: UA +leuks, no nitrites; scrotal U/S showed hyperemia of testicles bilaterally c/w orchitis. Patient w/ continued pain and edema to scrotum, no dysuria. His hypotension resolved after IVF resuscitation. He received vanc/zosyn overnight, though this was transitioned to rocephin 1g IV today. Will transition to cipro 250mg  BID starting tomorrow for increased scrotal penetration. Leukocytosis downtrending (9.6). HIV and RPR nonreactive. Portable CXR negative. -f/u urine cx and BCx -d/c IVF -d/c ceftriaxone and start cipro tomorrow -telemetry -repeat CXR (2 view)  #Acute on CKD: Cr 2.8 on admission (baseline 1.9-2.2). Patient received 2L NS in ED as well as 75cc/hr overnight. Repeat Cr this morning was 2.2, so AKI appears to be resolving and may have been prerenal in origin. Patient has had approx 500cc UOP today already and  renal US negative for signs of obstruction. Bladder scan was not performed this morning, but we will do this as needed if patient becomes oliguric again. -stop IVF -bladder scan prn -monitor I/O carefully -BMP in AM -continue holding lasix, metolazone, and lisinopril   #Trichomonas: UA found to have trichomonas on admission. S/p metronidazole 2g IV x 1 dose. Also s/p azithromycin 1g once overnight for possible chlamydia infection. -will need to educate patient that partner requires treatment -GC/chymydia pending  # Influenza: Patient found to have influenza. Symptoms are stable. - tamiflu 30mg  BID x 5 days (day 1), renally dosed -droplet precautions  # COPD: Wheezing heard on exam today.  -add albuterol nebs q6h prn  -robitussin prn   # Atrial fibrillation- Currently NSR; followed by Dr. Sallyanne Kuster. CHADS2: 3; not on coumadin d/t rectal bleeding.  -continue amiodorone  -continue ASA 162mg    # Uncontrolled DMII- A1c 8.2% on admission. He is on lantus 40U qHS and novolog 8U BID w/ meals at home.  -continue 40 units lantus  -SSI-sensitive  # HTN: BPs have normalized and are now in the Q000111Q systolic. Restarted coreg earlier today since SBPs had been in the 160s.  -continue home coreg -consider restarting home clonidine to prevent rebound HTN -consider restarting diuretics  Dispo: Disposition is deferred at this time, awaiting improvement of current medical problems.  Anticipated discharge in approximately 1 day(s).   The patient does have a current PCP [name], MD) and does not need an [facility] hospital follow-up appointment after discharge.  The patient does not have transportation limitations that hinder transportation to clinic appointments.  .Services Needed at time of discharge: Y = Yes, Blank = No PT:   OT:   RN:   Equipment:   Other:     LOS: 1 day   Rebecca Eaton, MD 08/28/2013, 4:06 PM

## 2013-08-28 NOTE — ED Provider Notes (Signed)
I performed a history and physical examination of  [name] and discussed his management with Dr. Silvio Clayman. I agree with the history, physical, assessment, and plan of care, with the following exceptions: None I was present for the following procedures: None  Time Spent in Critical Care of the patient: 30 minutes  Time spent in discussions with the patient and family: 10 minutes  [name]  CRITICAL CARE Performed by: Varney Biles   Total critical care time: 30 minutes  Critical care time was exclusive of separately billable procedures and treating other patients.  Critical care was necessary to treat or prevent imminent or life-threatening deterioration.  Critical care was time spent personally by me on the following activities: development of treatment plan with patient and/or surrogate as well as nursing, discussions with consultants, evaluation of patient's response to treatment, examination of patient, obtaining history from patient or surrogate, ordering and performing treatments and interventions, ordering and review of laboratory studies, ordering and review of radiographic studies, pulse oximetry and re-evaluation of patient's condition.   Pt comes in with hypotension. 2 Liters of ivf given - and BP improved. Lactate is normal. Concerns for sepsis, and vanc + zosyn given, pt cultured. Admission for further evaluation. Exam is non focal. Pt denies nausea, emesis, fevers, chills, chest pains, shortness of breath, headaches, neck pain/stiffness, abdominal pain, uti like symptoms.   Varney Biles, MD 08/28/13 [phone]

## 2013-08-28 NOTE — H&P (Signed)
  Date: 08/28/2013  Patient name: [name]  Medical record number: [id number]  Date of birth: Oct 05, 1945   I have seen and evaluated [name] and discussed their care with the Residency Team.  Briefly, [name] is a 68yo man with PMH of uncontrolled DM, peripheral neuropathy, gout, HLD, HTN, BPH, CAD who presented [facility] with hypotension and inability to urinate.  Further symptoms include fever, chills, SOB, sore throat and productive cough being day prior to admission.  He received fluids and vancomycin/zosyn in the ED.  Flu PCR was positive.  On exam, he was noted to have tender testicles and a testicular ultrasound revealed orchitis.  A UA was positive for pyuria, large LE and trichomonas, no nitrites.  WBC was 12.5, Cr was 2.8 (baseline around 1.9-2.2).  ALP and total bilirubin were abnormal. CXR revealed cardiomegaly.  BP was initially low and improved with fluids.   Assessment and Plan: I have seen and evaluated the patient as outlined above. I agree with the formulated Assessment and Plan as detailed in the residents' admission note, with the following changes:   1. Sepsis due to UTI vs. Orchitis: Patient was started on broad spectrum Abx initially, can likely be narrowed as cultures come back.  BC and UC are pending.  He did report recent sexual activity and inconsistent condom use so evaluation for GC/Chlamydia are warranted.  He was started on Vanc/Zosyn and given fluids at 75cc/hr.  Scrotal support and ice packs as needed.   2. AKI: Renal function above baseline.  Renal US ordered, holding lasix and lisinopril.  As limited UOP, will bladder scan as needed and I/O as needed.   3. Trichomonas: Given flagyl, will check for GC and RPR.  Also give rocephin and azithro to empirically treat for GC/Chlamydia.   4. Flu positive: Droplet precautions, start tamiflu. Supportive therapy.   Other issues as per resident note.   Ryan Falcon, MD 1/13/20153:35 PM

## 2013-08-28 NOTE — Consult Note (Signed)
WOC wound consult note Reason for Consult: Consult requested for left leg stump.  Pt had amputation in Nov by Dr Sharol Given.  He recently had a follow-up apt last week with the ortho service and there were no further recommendations.   Wound type: Healed full thickness amputation stump Drainage (amount, consistency, odor) No odor or drainage, small patchy areas of dry scabs. Periwound: Intact skin surrounding. Dressing procedure/placement/frequency: Foam dressing to protect from further injury. Pt plans to continue follow-up  with Dr Sharol Given after discharge. Please re-consult if further assistance is needed.  Thank-you,  Julien Girt MSN, [place], [place], [place], [place]

## 2013-08-28 NOTE — Care Management Note (Unsigned)
    Page 1 of 1   08/28/2013     3:55:42 PM   CARE MANAGEMENT NOTE 08/28/2013  Patient:  [name], [name]   Account Number:  [id number]  Date Initiated:  08/28/2013  Documentation initiated by:  GRAVES-BIGELOW,Holly Iannaccone  Subjective/Objective Assessment:   Pt admitted from [facility]. Pt admitted for hypotension and fevers. Initiated on IV ABX.     Action/Plan:   CM will continue to monitor for disposition needs.   Anticipated DC Date:  08/31/2013   Anticipated DC Plan:  [place]  CM consult  [facility]      Choice offered to / List presented to:             Status of service:  In process, will continue to follow Medicare Important Message given?   (If response is "NO", the following Medicare IM given date fields will be blank) Date Medicare IM given:   Date Additional Medicare IM given:    Discharge Disposition:    Per UR Regulation:  Reviewed for med. necessity/level of care/duration of stay  If discussed at [place] of Stay Meetings, dates discussed:    Comments:

## 2013-08-28 NOTE — Progress Notes (Signed)
UR Completed Chai Verdejo Graves-Bigelow, RN,BSN [phone]  

## 2013-08-29 ENCOUNTER — Inpatient Hospital Stay (HOSPITAL_COMMUNITY): Payer: Medicare (Managed Care)

## 2013-08-29 DIAGNOSIS — N452 Orchitis: Secondary | ICD-10-CM

## 2013-08-29 DIAGNOSIS — H3530 Unspecified macular degeneration: Secondary | ICD-10-CM

## 2013-08-29 DIAGNOSIS — I2510 Atherosclerotic heart disease of native coronary artery without angina pectoris: Secondary | ICD-10-CM

## 2013-08-29 DIAGNOSIS — G4733 Obstructive sleep apnea (adult) (pediatric): Secondary | ICD-10-CM

## 2013-08-29 LAB — BASIC METABOLIC PANEL
BUN: 28 mg/dL — ABNORMAL HIGH (ref 6–23)
CALCIUM: 9.2 mg/dL (ref 8.4–10.5)
CO2: 24 mEq/L (ref 19–32)
CREATININE: 1.71 mg/dL — AB (ref 0.50–1.35)
Chloride: 97 mEq/L (ref 96–112)
GFR calc Af Amer: 47 mL/min — ABNORMAL LOW (ref >90)
GFR calc non Af Amer: 40 mL/min — ABNORMAL LOW (ref >90)
GLUCOSE: 152 mg/dL — AB (ref 70–99)
Potassium: 4.4 mEq/L (ref 3.7–5.3)
SODIUM: 134 meq/L — AB (ref 137–147)

## 2013-08-29 LAB — GLUCOSE, CAPILLARY
GLUCOSE-CAPILLARY: 149 mg/dL — AB (ref 70–99)
Glucose-Capillary: 127 mg/dL — ABNORMAL HIGH (ref 70–99)

## 2013-08-29 LAB — URINE CULTURE
Colony Count: NO GROWTH
Culture: NO GROWTH

## 2013-08-29 MED ORDER — CIPROFLOXACIN HCL 250 MG PO TABS: 250.0000 mg | ORAL_TABLET | Freq: Two times a day (BID) | ORAL | Status: DC

## 2013-08-29 MED ORDER — IPRATROPIUM-ALBUTEROL 0.5-2.5 (3) MG/3ML IN SOLN
3.0000 mL | RESPIRATORY_TRACT | Status: DC
  Administered 2013-08-29: 3 mL via RESPIRATORY_TRACT
  Filled 2013-08-29 (×2): qty 3

## 2013-08-29 MED ORDER — ISOSORBIDE MONONITRATE ER 30 MG PO TB24
30.0000 mg | ORAL_TABLET | Freq: Every day | ORAL | Status: DC
  Administered 2013-08-29: 30 mg via ORAL
  Filled 2013-08-29: qty 1

## 2013-08-29 MED ORDER — LISINOPRIL 20 MG PO TABS
20.0000 mg | ORAL_TABLET | Freq: Every day | ORAL | Status: DC
  Administered 2013-08-29: 20 mg via ORAL
  Filled 2013-08-29: qty 1

## 2013-08-29 MED ORDER — OSELTAMIVIR PHOSPHATE 30 MG PO CAPS: 30.0000 mg | ORAL_CAPSULE | Freq: Two times a day (BID) | ORAL | Status: DC

## 2013-08-29 MED ORDER — INSULIN GLARGINE 100 UNIT/ML ~~LOC~~ SOLN: 26.0000 [IU] | Freq: Every day | SUBCUTANEOUS | Status: DC

## 2013-08-29 MED ORDER — FUROSEMIDE 40 MG PO TABS
40.0000 mg | ORAL_TABLET | Freq: Two times a day (BID) | ORAL | Status: DC
  Filled 2013-08-29 (×2): qty 1

## 2013-08-29 MED ORDER — CLONIDINE HCL 0.2 MG PO TABS
0.2000 mg | ORAL_TABLET | Freq: Two times a day (BID) | ORAL | Status: DC
  Administered 2013-08-29: 0.2 mg via ORAL
  Filled 2013-08-29 (×2): qty 1

## 2013-08-29 NOTE — Discharge Instructions (Signed)
Please continue taking a decreased dose of lantus (26 units each night) until you are eating better. Please follow up with your primary care doctor when you are next at the [facility] clinic.  Please DO NOT take your metolazone until you are seen by your PCP.  Please take ciprofloxacin 250mg  twice daily starting TONIGHT (1/14) and continue for 8 more days.  Orchitis Orchitis is an infection of the testicle of usually sudden onset (happens quickly). It may be viral or bacterial (caused by germs). Usually with this illness there is generalized malaise (not feeling well) and fever. There is also pain. There is usually tenderness and swelling of the scrotum and testicle. DIAGNOSIS  Your caregiver will perform an exam to make sure there is not another reason for the pain in your testicle. A rectal exam may be done to find out if the prostate is swollen and tender. Blood work may be done to see if your white blood cell count is elevated. This can help determine if an infection is viral or bacterial. A urinalysis can also determine what type of infection is present. Most bacterial infections can be treated with antibiotics (medications which kill germs). LET YOUR CAREGIVER KNOW ABOUT:  Allergies.  Medications taken including herbs, eye drops, over the counter medications, and creams.  Use of steroids (by mouth or creams).  Previous problems with anesthetics or novocaine.  Previous prostate infections.  History of blood clots (thrombophlebitis).  History of bleeding or blood problems.  Previous surgery.  Previous urinary tract infection.  Other health problems. HOME CARE INSTRUCTIONS   Apply cold packs to the scrotal area for twenty minutes, four times per day or as needed.  A scrotal support may be helpful. Keep a small pillow or support under your testicles while lying or sitting down.  Only take over-the-counter or prescription medicines for pain, discomfort, or fever as directed by your  caregiver.  Take all medications, including antibiotics, as directed. Take the antibiotics for the full prescribed length of time even if you are feeling better. SEEK IMMEDIATE MEDICAL CARE IF:   Your redness, swelling, or pain in the testicle increases or is not getting better.  You have a fever.  You have pain not relieved with medicines.  You have any worsening of any symptoms (problems) that originally brought you in for medical care. Document Released: 07/30/2000 Document Revised: 10/25/2011 Document Reviewed: 08/02/2005 [facility] Patient Information 2014 [place].   Influenza A (H1N1) H1N1 formerly called "swine flu" is a new influenza virus causing sickness in people. The H1N1 virus is different from seasonal influenza viruses. However, the H1N1 symptoms are similar to seasonal influenza and it is spread from person to person. You may be at higher risk for serious problems if you have underlying serious medical conditions. The CDC and the [facility] are following reported cases around the world. CAUSES   The flu is thought to spread mainly person-to-person through coughing or sneezing of infected people.  A person may become infected by touching something with the virus on it and then touching their mouth or nose. SYMPTOMS   Fever.  Headache.  Tiredness.  Cough.  Sore throat.  Runny or stuffy nose.  Body aches.  Diarrhea and vomiting These symptoms are referred to as "flu-like symptoms." A lot of different illnesses, including the common cold, may have similar symptoms. DIAGNOSIS   There are tests that can tell if you have the H1N1 virus.  Confirmed cases of H1N1 will be reported  to the state or local health department.  A doctor's exam may be needed to tell whether you have an infection that is a complication of the flu. HOME CARE INSTRUCTIONS   Stay informed. Visit the [facility] website for current recommendations. Visit [employer].  You may also call 1-800-CDC-INFO 5195084588).  Get help early if you develop any of the above symptoms.  If you are at high risk from complications of the flu, talk to your caregiver as soon as you develop flu-like symptoms. Those at higher risk for complications include:  People 65 years or older.  People with chronic medical conditions.  Pregnant women.  Young children.  Your caregiver may recommend antiviral medicine to help treat the flu.  If you get the flu, get plenty of rest, drink enough water and fluids to keep your urine clear or pale yellow, and avoid using alcohol or tobacco.  You may take over-the-counter medicine to relieve the symptoms of the flu if your caregiver approves. (Never give aspirin to children or teenagers who have flu-like symptoms, particularly fever). TREATMENT  If you do get sick, antiviral drugs are available. These drugs can make your illness milder and make you feel better faster. Treatment should start soon after illness starts. It is only effective if taken within the first day of becoming ill. Only your caregiver can prescribe antiviral medication.  PREVENTION   Cover your nose and mouth with a tissue or your arm when you cough or sneeze. Throw the tissue away.  Wash your hands often with soap and warm water, especially after you cough or sneeze. Alcohol-based cleaners are also effective against germs.  Avoid touching your eyes, nose or mouth. This is one way germs spread.  Try to avoid contact with sick people. Follow public health advice regarding school closures. Avoid crowds.  Stay home if you get sick. Limit contact with others to keep from infecting them. People infected with the H1N1 virus may be able to infect others anywhere from 1 day before feeling sick to 5-7 days after getting flu symptoms.  An H1N1 vaccine is available to help protect against the virus. In addition to the H1N1 vaccine, you will need to be vaccinated for seasonal  influenza. The H1N1 and seasonal vaccines may be given on the same day. The CDC especially recommends the H1N1 vaccine for:  Pregnant women.  People who live with or care for children younger than 36 months of age.  Health care and emergency services personnel.  Persons between the ages of 60 months through 60 years of age.  People from ages 70 through 15 years who are at higher risk for H1N1 because of chronic health disorders or immune system problems. FACEMASKS In community and home settings, the use of facemasks and N95 respirators are not normally recommended. In certain circumstances, a facemask or N95 respirator may be used for persons at increased risk of severe illness from influenza. Your caregiver can give additional recommendations for facemask use. IN CHILDREN, EMERGENCY WARNING SIGNS THAT NEED URGENT MEDICAL CARE:  Fast breathing or trouble breathing.  Bluish skin color.  Not drinking enough fluids.  Not waking up or not interacting normally.  Being so fussy that the child does not want to be held.  Your child has an oral temperature above 102 F (38.9 C), not controlled by medicine.  Your baby is older than 3 months with a rectal temperature of 102 F (38.9 C) or higher.  Your baby is 41 months old or younger  with a rectal temperature of 100.4 F (38 C) or higher.  Flu-like symptoms improve but then return with fever and worse cough. IN ADULTS, EMERGENCY WARNING SIGNS THAT NEED URGENT MEDICAL CARE:  Difficulty breathing or shortness of breath.  Pain or pressure in the chest or abdomen.  Sudden dizziness.  Confusion.  Severe or persistent vomiting.  Bluish color.  You have a oral temperature above 102 F (38.9 C), not controlled by medicine.  Flu-like symptoms improve but return with fever and worse cough. SEEK IMMEDIATE MEDICAL CARE IF:  You or someone you know is experiencing any of the above symptoms. When you arrive at the emergency center, report  that you think you have the flu. You may be asked to wear a mask and/or sit in a secluded area to protect others from getting sick. MAKE SURE YOU:   Understand these instructions.  Will watch your condition.  Will get help right away if you are not doing well or get worse. Some of this information courtesy of the CDC.  Document Released: 01/19/2008 Document Revised: 10/25/2011 Document Reviewed: 01/19/2008 [facility] Patient Information 2014 [place], [place].

## 2013-08-29 NOTE — Progress Notes (Signed)
Subjective: Patient reports significant improvement of his scrotal pain. He has persistent nonproductive cough, though does not feel short of breath. He thinks his cough and URI symptoms are improving.   Objective: Vital signs in last 24 hours: Filed Vitals:   08/29/13 0400 08/29/13 0801 08/29/13 1134 08/29/13 1237  BP: 175/93 205/124 152/92   Pulse: 70 78 69   Temp: 98.3 F (36.8 C) 98 F (36.7 C) 98.7 F (37.1 C)   TempSrc: Oral Oral Oral   Resp: 18 20 18    Height:      Weight:      SpO2: 94% 94% 99% 100%   Weight change: -72 lb 3.4 oz (-32.755 kg)  Intake/Output Summary (Last 24 hours) at 08/29/13 1346 Last data filed at 08/29/13 1141  Gross per 24 hour  Intake      3 ml  Output   1300 ml  Net  -1297 ml   Physical Exam General: alert, cooperative, and in no apparent distress; in good spirits and much more talkative this morning HEENT: NCAT- pt is legally blind Neck: supple Lungs: mild expiratory wheezing throughout bilateral lung fields Heart: regular rate and rhythm, no murmurs, gallops, or rubs Abdomen: soft, non-tender, non-distended, normal bowel sounds GU: scrotum is edematous and somewhat TTP- improved from yesterday Extremities: no BLE edema, warm extremities; +chronic venous changes to skin on BLE; 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 Neurologic: alert & oriented X3, moving all extremities spontaneously   Lab Results: Basic Metabolic Panel:  Recent Labs Lab 08/28/13 0405 08/29/13 0325  NA 138 134*  K 4.3 4.4  CL 98 97  CO2 27 24  GLUCOSE 149* 152*  BUN 42* 28*  CREATININE 2.22* 1.71*  CALCIUM 9.3 9.2   Liver Function Tests:  Recent Labs Lab 08/27/13 1632  AST 24  ALT 62*  ALKPHOS 134*  BILITOT 0.2*  PROT 7.9  ALBUMIN 2.9*   CBC:  Recent Labs Lab 08/27/13 1632 08/28/13 0405  WBC 12.5* 9.6  NEUTROABS 9.4*  --   HGB 12.8* 12.3*  HCT 39.4 38.1*  MCV 83.5 81.4  PLT 257 255   CBG:  Recent Labs Lab 08/28/13 1156  08/28/13 1543 08/28/13 1735 08/28/13 2026 08/29/13 0740 08/29/13 1132  GLUCAP 176* 228* 185* 194* 127* 149*   Hemoglobin A1C:  Recent Labs Lab 08/28/13 0838  HGBA1C 8.2*   Urinalysis:  Recent Labs Lab 08/27/13 1644  COLORURINE YELLOW  LABSPEC 1.014  PHURINE 5.5  GLUCOSEU NEGATIVE  HGBUR LARGE*  BILIRUBINUR NEGATIVE  KETONESUR NEGATIVE  PROTEINUR NEGATIVE  UROBILINOGEN 0.2  NITRITE NEGATIVE  LEUKOCYTESUR LARGE*   Misc. Labs: HIV and RPR nonreactive  Micro Results: Recent Results (from the past 240 hour(s))  URINE CULTURE     Status: None   Collection Time    08/27/13  4:44 PM      Result Value Range Status   Specimen Description URINE, CATHETERIZED   Final   Special Requests NONE   Final   Culture  Setup Time     Final   Value: 08/28/2013 02:24     Performed at [facility] Count     Final   Value: NO GROWTH     Performed at [facility]   Culture     Final   Value: NO GROWTH     Performed at [facility]   Report Status 08/29/2013 FINAL   Final  CULTURE, BLOOD (ROUTINE X 2)  Status: None   Collection Time    08/27/13  5:15 PM      Result Value Range Status   Specimen Description BLOOD ARM LEFT   Final   Special Requests BOTTLES DRAWN AEROBIC AND ANAEROBIC 10CC   Final   Culture  Setup Time     Final   Value: 08/27/2013 21:57     Performed at [facility]   Culture     Final   Value:        BLOOD CULTURE RECEIVED NO GROWTH TO DATE CULTURE WILL BE HELD FOR 5 DAYS BEFORE ISSUING A FINAL NEGATIVE REPORT     Performed at [facility]   Report Status PENDING   Incomplete  CULTURE, BLOOD (ROUTINE X 2)     Status: None   Collection Time    08/27/13  5:25 PM      Result Value Range Status   Specimen Description BLOOD HAND LEFT   Final   Special Requests     Final   Value: BOTTLES DRAWN AEROBIC AND ANAEROBIC 10CCBLUE 5CCRED   Culture  Setup Time     Final   Value: 08/27/2013 21:57     Performed at  [facility]   Culture     Final   Value:        BLOOD CULTURE RECEIVED NO GROWTH TO DATE CULTURE WILL BE HELD FOR 5 DAYS BEFORE ISSUING A FINAL NEGATIVE REPORT     Performed at [facility]   Report Status PENDING   Incomplete   Studies/Results: Dg Chest 2 View  08/29/2013   CLINICAL DATA:  Shortness of breath.  Cough.  EXAM: CHEST  2 VIEW  COMPARISON:  08/27/2013 .  FINDINGS: Cardiomegaly, normal pulmonary vascularity. Cardiac pacer with lead tips in the right atrium and right ventricle. No focal infiltrate. No pleural effusion or pneumothorax. Diffuse degenerative changes thoracic spine.  IMPRESSION: Cardiomegaly. No CHF. Cardiac pacer in good anatomic position . No acute cardiopulmonary disease.   Electronically Signed   By: Marcello Moores  Register   On: 08/29/2013 09:44   US Scrotum  08/28/2013   CLINICAL DATA:  Edematous and erythematous scrotum. Current history of diabetes.  EXAM: SCROTAL ULTRASOUND  DOPPLER ULTRASOUND OF THE TESTICLES  TECHNIQUE: Complete ultrasound examination of the testicles, epididymis, and other scrotal structures was performed. Color and spectral Doppler ultrasound were also utilized to evaluate blood flow to the testicles.  COMPARISON:  None.  FINDINGS: Right testicle  Measurements: Approximately 3.6 x 1.7 x 2.5 cm. No focal parenchymal abnormality. Mild hyperemia on color Doppler evaluation.  Left testicle  Measurements: Approximately 3.1 x 1.9 x 3.3 cm. No focal parenchymal abnormality. Marked hyperemia on color Doppler evaluation.  Right epididymis: Normal in size and appearance. No evidence of hyperemia on color Doppler evaluation.  Left epididymis: Normal in size containing an approximate 1.1 x 0.7 cm cyst or spermatocele. No evidence of hyperemia on color Doppler evaluation.  Hydrocele:  None visualized.  Varicocele:  None visualized.  Pulsed Doppler interrogation of both testes demonstrates normal low resistance arterial and venous waveforms bilaterally.   IMPRESSION: 1. No evidence of testicular torsion. 2. Hyperemia involving both testicles, left greater than right, consistent with orchitis. 3. No evidence of epididymitis. 4. Cyst or spermatocele involving the left epididymal head.   Electronically Signed   By: Evangeline Dakin M.D.   On: 08/28/2013 00:13   US Renal  08/28/2013   CLINICAL DATA:  Rule out hydronephrosis  EXAM:  RENAL/URINARY TRACT ULTRASOUND COMPLETE  COMPARISON:  Renal ultrasound 03/20/2013  FINDINGS: Right Kidney:  Length: 12.8 cm. 2.2 cm midpole cyst. Echogenicity within normal limits. No mass or hydronephrosis visualized.  Left Kidney:  Length: 12.8 cm. 2.1 cm lower pole cyst. Echogenicity within normal limits. No mass or hydronephrosis visualized.  Bladder:  Appears normal for degree of bladder distention.  Gallstones are noted.  IMPRESSION: Normal renal size.  No obstruction.  Cholelithiasis.   Electronically Signed   By: Franchot Gallo M.D.   On: 08/28/2013 08:35   Dg Chest Port 1 View  08/27/2013   CLINICAL DATA:  Hypotension  EXAM: PORTABLE CHEST - 1 VIEW  COMPARISON:  10/03/2012  FINDINGS: The heart remains moderately enlarged. Mediastinum remains prominent. No pneumothorax or pleural effusion. 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  IMPRESSION: Cardiomegaly without pulmonary edema.   Electronically Signed   By: Maryclare Bean M.D.   On: 08/27/2013 17:01   Medications: I have reviewed the patient's current medications. Scheduled Meds: . amiodarone  200 mg Oral Daily  . aspirin  162 mg Oral Daily  . brimonidine  1 drop Both Eyes TID  . carbamazepine  200 mg Oral BID  . carvedilol  25 mg Oral BID WC  . cholecalciferol  1,000 Units Oral QHS  . ciprofloxacin  250 mg Oral BID  . cloNIDine  0.2 mg Oral BID  . dorzolamide-timolol  1 drop Both Eyes BID  . febuxostat  40 mg Oral QHS  . heparin  5,000 Units Subcutaneous Q8H  . insulin aspart  0-9 Units Subcutaneous TID WC  . insulin glargine  26 Units  Subcutaneous QHS  . ipratropium-albuterol  3 mL Nebulization Q4H  . oseltamivir  30 mg Oral BID  . pantoprazole  80 mg Oral Daily  . polyethylene glycol  17 g Oral Daily  . polyvinyl alcohol  1 drop Both Eyes BID  . sodium chloride  3 mL Intravenous Q12H   Continuous Infusions:  PRN Meds:.guaiFENesin, ondansetron (ZOFRAN) IV, ondansetron, oxyCODONE, sodium chloride Assessment/Plan:  #Sepsis 2/2 UTI vs orchitis: Patient no longer septic and his symptoms are improving. Repeat CXR showed no acute cardiopulmonary process. [place] and [place] NGTD. -cipro 250mg  PO BID (day 2/10)  #Acute on CKD: Cr back at baseline (1.71) today. Patient has had >1.3L UOP overnight. Will plan to restart -restart lasix and lisinopril  -continue to hold metolazone  #Trichomonas: UA found to have trichomonas on admission. S/p metronidazole 2g IV x 1 dose. Also s/p azithromycin 1g once overnight for possible chlamydia infection. -GC/chymydia was never collected; given symptoms are improving and patient has been receiving antibiotics for 2.5 days, this will not be helpful now so we will not collect; will attempt to add on to prior urine collection  # Influenza: Patient found to have influenza. Symptoms are stable. - tamiflu 30mg  BID x 5 days (day 2), renally dosed -droplet precautions  # COPD: Wheezing heard on exam today.  -gave duoneb x 1 and wheezing improved -robitussin prn   # Atrial fibrillation- Currently NSR; followed by Dr. Sallyanne Kuster. On amiodarone; not on coumadin d/t rectal bleeding.  -continue amiodorone  -continue ASA 162mg  daily  # Uncontrolled DMII- A1c 8.2% on admission. He is on lantus 40U qHS and novolog 8U BID w/ meals at home.  -lantus 26U qHS- will ask patient to continue this dose of lantus until he is eating better at home -SSI-sensitive  # HTN: Hypotension resolved and patient became hypertensive overnight (highest  205/124) likely 2/2 rebound HTN from discontinuation of clonidine.  -Restart  clonidine, lasix, lisinopril; will continue to hold home metolazone -continue home coreg  Dispo: Disposition is deferred at this time, awaiting improvement of current medical problems.  Anticipated discharge in approximately 1 day(s).   The patient does have a current PCP [name], MD) and does not need an [facility] hospital follow-up appointment after discharge.  The patient does not have transportation limitations that hinder transportation to clinic appointments.  .Services Needed at time of discharge: Y = Yes, Blank = No PT:   OT:   RN:   Equipment:   Other:     LOS: 2 days   Rebecca Eaton, MD 08/29/2013, 1:46 PM

## 2013-08-29 NOTE — Discharge Summary (Signed)
Name: [name] MRN: [id number] DOB: 1945-09-13 69 y.o. PCP: [name], MD  Date of Admission: 08/27/2013  4:15 PM Date of Discharge: 08/29/2013 Attending Physician: Sid Falcon, MD  Discharge Diagnosis: Principal Problem:   Sepsis- 2/2 UTI vs orchitis Active Problems:   Type 2 diabetes mellitus, uncontrolled   MACULAR DEGENERATION- legally blind   HYPERTENSION   COPD (chronic obstructive pulmonary disease)   Coronary artery disease   OSA (obstructive sleep apnea)   Atrial fibrillation   Trichomonas infection   Epididymo-orchitis, acute- resolving   Discharge Medications:   Medication List    STOP taking these medications       metolazone 2.5 MG tablet  Commonly known as:  ZAROXOLYN      TAKE these medications       albuterol 108 (90 BASE) MCG/ACT inhaler  Commonly known as:  PROVENTIL HFA;VENTOLIN HFA  Inhale 2 puffs into the lungs every 6 (six) hours as needed for wheezing.     amiodarone 200 MG tablet  Commonly known as:  PACERONE  Take 200 mg by mouth daily.     aspirin 81 MG chewable tablet  Chew 81 mg by mouth daily.     brimonidine 0.1 % Soln  Commonly known as:  ALPHAGAN P  Place 1 drop into both eyes every 8 (eight) hours.     carbamazepine 200 MG tablet  Commonly known as:  TEGRETOL  Take 200 mg by mouth 2 (two) times daily.     carvedilol 25 MG tablet  Commonly known as:  COREG  Take 25 mg by mouth 2 (two) times daily with a meal.     cholecalciferol 1000 UNITS tablet  Commonly known as:  VITAMIN D  Take 1,000 Units by mouth at bedtime.     ciprofloxacin 250 MG tablet  Commonly known as:  CIPRO  Take 1 tablet (250 mg total) by mouth 2 (two) times daily.     cloNIDine 0.2 MG tablet  Commonly known as:  CATAPRES  Take 0.2 mg by mouth 2 (two) times daily.     colchicine 0.6 MG tablet  Take 0.6 mg by mouth 2 (two) times daily as needed (for gout flare-ups).     dorzolamide-timolol 22.3-6.8 MG/ML ophthalmic solution    Commonly known as:  COSOPT  Place 1 drop into both eyes 2 (two) times daily.     exenatide 10 MCG/0.04ML Sopn injection  Commonly known as:  BYETTA  Inject 10 mcg into the skin 2 (two) times daily with a meal.     febuxostat 40 MG tablet  Commonly known as:  ULORIC  Take 40 mg by mouth at bedtime.     furosemide 40 MG tablet  Commonly known as:  LASIX  Take 40 mg by mouth 2 (two) times daily.     glucose blood test strip  1 each by Other route as needed. Use as instructed to check blood sugar before meals and at bedtime at least 2-3 time daily     HYDROcodone-acetaminophen 5-325 MG per tablet  Commonly known as:  NORCO  Take 1-2 tablets by mouth every 6 (six) hours as needed.     insulin aspart 100 UNIT/ML injection  Commonly known as:  novoLOG  Inject 8 Units into the skin 2 (two) times daily before a meal.     insulin glargine 100 UNIT/ML injection  Commonly known as:  LANTUS  Inject 0.26 mLs (26 Units total) into the skin at bedtime.  Insulin Pen Needle 31G X 8 MM Misc  Use to inject insulin 2x daily before breakfast and dinner.     isosorbide mononitrate 30 MG 24 hr tablet  Commonly known as:  IMDUR  Take 30 mg by mouth daily.     lisinopril 20 MG tablet  Commonly known as:  PRINIVIL,ZESTRIL  Take 20 mg by mouth at bedtime.     magnesium oxide 400 MG tablet  Commonly known as:  MAG-OX  Take 400 mg by mouth daily.     methylcellulose 1 % ophthalmic solution  Commonly known as:  ARTIFICIAL TEARS  Place 1 drop into both eyes 2 (two) times daily.     omeprazole 20 MG tablet  Commonly known as:  PRILOSEC OTC  Take 20 mg by mouth every evening.     oseltamivir 30 MG capsule  Commonly known as:  TAMIFLU  Take 1 capsule (30 mg total) by mouth 2 (two) times daily.     polyethylene glycol packet  Commonly known as:  MIRALAX / GLYCOLAX  Take 17 g by mouth daily.     potassium chloride SA 20 MEQ tablet  Commonly known as:  K-DUR,KLOR-CON  Take 20 mEq by  mouth 2 (two) times daily.     rosuvastatin 40 MG tablet  Commonly known as:  CRESTOR  Take 40 mg by mouth every evening.        Disposition and follow-up:   [name] was discharged from [facility] in Stable condition.  At the hospital follow up visit please address:  1.  Consider restarting metolazone as this was held at discharge 2.  Recheck CBG and evaluate if patient is eating well enough to up titrate his lantus; he was discharged on 26U lantus given he had poor PO intake and CBGs stable 3. GC/Chlamydia probe was not collected during hospitalization and by the time he was discharged his symptoms were improving and he had received 2.5 days of antibiotics, so we did not collect this. Please assess for symptom resolution and need for checking for GC/Chlamydia. 4. Assess for compliance with tamiflu and cipro for influenza and orchitis respectively  2.  Labs / imaging needed at time of follow-up: CBG  3.  Pending labs/ test needing follow-up: UCx, BCx  Follow-up Appointments: Follow-up Information   Follow up with Sherian Maroon, MD On 08/30/2013. (9am)    Specialty:  Family Medicine   Contact information:   [id number] [id number] [phone]       Discharge Instructions: Discharge Orders   Future Appointments Provider Department Dept Phone   12/20/2013 9:30 AM Kathee Delton, MD [facility] [phone]   Future Orders Complete By Expires   [name] Call MD:  Anytime you have any of the following symptoms: 1) 3 pound weight gain in 24 hours or 5 pounds in 1 week 2) shortness of breath, with or without a dry hacking cough 3) swelling in the hands, feet or stomach 4) if you have to sleep on extra pillows at night in order to breathe.  As directed    Call MD for:  difficulty breathing, headache or visual disturbances  As directed    Call MD for:  extreme fatigue  As directed    Call MD for:  severe  uncontrolled pain  As directed    Diet - low sodium heart healthy  As directed    Increase activity slowly  As directed  Consultations:  none  Procedures Performed:  Dg Chest 2 View  08/29/2013   CLINICAL DATA:  Shortness of breath.  Cough.  EXAM: CHEST  2 VIEW  COMPARISON:  08/27/2013 .  FINDINGS: Cardiomegaly, normal pulmonary vascularity. Cardiac pacer with lead tips in the right atrium and right ventricle. No focal infiltrate. No pleural effusion or pneumothorax. Diffuse degenerative changes thoracic spine.  IMPRESSION: Cardiomegaly. No CHF. Cardiac pacer in good anatomic position . No acute cardiopulmonary disease.   Electronically Signed   By: Marcello Moores  Register   On: 08/29/2013 09:44   US Scrotum  08/28/2013   CLINICAL DATA:  Edematous and erythematous scrotum. Current history of diabetes.  EXAM: SCROTAL ULTRASOUND  DOPPLER ULTRASOUND OF THE TESTICLES  TECHNIQUE: Complete ultrasound examination of the testicles, epididymis, and other scrotal structures was performed. Color and spectral Doppler ultrasound were also utilized to evaluate blood flow to the testicles.  COMPARISON:  None.  FINDINGS: Right testicle  Measurements: Approximately 3.6 x 1.7 x 2.5 cm. No focal parenchymal abnormality. Mild hyperemia on color Doppler evaluation.  Left testicle  Measurements: Approximately 3.1 x 1.9 x 3.3 cm. No focal parenchymal abnormality. Marked hyperemia on color Doppler evaluation.  Right epididymis: Normal in size and appearance. No evidence of hyperemia on color Doppler evaluation.  Left epididymis: Normal in size containing an approximate 1.1 x 0.7 cm cyst or spermatocele. No evidence of hyperemia on color Doppler evaluation.  Hydrocele:  None visualized.  Varicocele:  None visualized.  Pulsed Doppler interrogation of both testes demonstrates normal low resistance arterial and venous waveforms bilaterally.  IMPRESSION: 1. No evidence of testicular torsion. 2. Hyperemia involving both testicles,  left greater than right, consistent with orchitis. 3. No evidence of epididymitis. 4. Cyst or spermatocele involving the left epididymal head.   Electronically Signed   By: Evangeline Dakin M.D.   On: 08/28/2013 00:13   US Renal  08/28/2013   CLINICAL DATA:  Rule out hydronephrosis  EXAM: RENAL/URINARY TRACT ULTRASOUND COMPLETE  COMPARISON:  Renal ultrasound 03/20/2013  FINDINGS: Right Kidney:  Length: 12.8 cm. 2.2 cm midpole cyst. Echogenicity within normal limits. No mass or hydronephrosis visualized.  Left Kidney:  Length: 12.8 cm. 2.1 cm lower pole cyst. Echogenicity within normal limits. No mass or hydronephrosis visualized.  Bladder:  Appears normal for degree of bladder distention.  Gallstones are noted.  IMPRESSION: Normal renal size.  No obstruction.  Cholelithiasis.   Electronically Signed   By: Franchot Gallo M.D.   On: 08/28/2013 08:35   Dg Chest Port 1 View  08/27/2013   CLINICAL DATA:  Hypotension  EXAM: PORTABLE CHEST - 1 VIEW  COMPARISON:  10/03/2012  FINDINGS: The heart remains moderately enlarged. Mediastinum remains prominent. No pneumothorax or pleural effusion. 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  IMPRESSION: Cardiomegaly without pulmonary edema.   Electronically Signed   By: Maryclare Bean M.D.   On: 08/27/2013 17:01   Admission HPI:  Pt is a 68 y.o. 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 [facility]. Pt reports that [facility] called EMS because he was hypotensive and unable to urinate. He reports having fever/chills, SOB, sore throat, and productive cough that began yesterday morning. He denies any associated presyncope, myalgias, CP, N/V/D/C, abdominal pain, or urinary symptoms. He denies any sick  contacts. He reports sexual  activity over 3 months ago and inconsistent condom use.   Upon arrival in ED, he was hypotensive at 89/59 and received 2L NS bolus and vancomycin/zosyn.  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   Hospital Course by problem list: #Sepsis 2/2 UTI vs orchitis: UA +leuks, no nitrites; scrotal U/S showed hyperemia of testicles bilaterally c/w orchitis. BPs very soft on admission. He received vanc/zosyn overnight, though this was transitioned to rocephin 1g x 1 dose. Patient was transitioned to cipro 250mg  BID on HD 2 for increased scrotal penetration. On morning of discharge, patient w/ some continued pain and edema to scrotum, no dysuria- though symptoms are definitely improving. His hypotension resolved after IVF resuscitation (2L NS and NS at 75cc/hr x 10 hours). Leukocytosis trending down. HIV and RPR nonreactive. UCx, BCx NGTD. Portable CXR as well as repeat 2 view chest xray showed no acute process. Patient was monitored on telemetry, no events. He was discharged with cipro 250mg  BID for 8 more days.   #Acute on CKD: Cr 2.8 on admission (baseline 1.9-2.2). Patient received 2L NS in ED as well as 75cc/hr overnight after which patient's Cr downtrended nicely and was at baseline (1.71) on morning of discharge. There was concern for oliguria at time of admission, though renal US negative for signs of obstruction and his UOP was >1.3L overnight. Held lasix, metolazone, and lisinopril during admission, though the lasix and lisinopril were restarted on morning of discharge. Metolazone was held at discharge.  #Trichomonas: UA found to have trichomonas on admission, no dysuria or urethral discharge. S/p metronidazole 2g IV x 1 dose. Also s/p azithromycin 1g once overnight for possible chlamydia infection. Patient was told that his partner will need to be treated. GC/chymydia never collected unfortunately, therefore given  symptoms are improving and patient has been receiving antibiotics for 2.5 days, this will not be helpful now so we did not collect. Patient will f/u with PCP later this week and should be reevaluated for symptoms concerning for STI.   # Influenza: Patient found to have influenza. Cough is nonproductive and improving. No SOB. Patient was managed on tamiflu 30mg  BID x 2 days and was discharged with three more days worth. Patient was kept on droplet precautions.  # COPD: Wheezing heard on exam, though no shortness of breath. He received 1 albuterol neb and 1 duoneb during his hospitalization with improvement of wheezing. He had mild wheeze on morning of discharge, though no SOB. Cough was improving and nonproductive.  # Atrial fibrillation- Patient maintained NSR. Continued amiodarone and ASA at home dose.  # Uncontrolled DMII- A1c 8.2% on admission. He is on lantus 40U qHS and novolog 8U BID w/ meals at home.  Patient was managed on lantus 26U and SSI during his hospitalization. His CBGs were relatively well controlled (124-194). He was discharged on this reduced dose of lantus for fear of low CBGs at home given he was not eating well during his hospital stay. He will need close follow up and likely uptitration of his lantus within the next few days.   # HTN: Patient was originally hypotensive on admission and all antihypertensives were held. BPs responded well to IVF resuscitation and patient became hypertensive overnight. At this point his home coreg was restarted. On HD2, patient had BP 205/124 therefore, home clonidine, lasix and lisinopril were restarted. Metolazone was held during his stay and at discharge. He has close  PCP follow up.    Discharge Vitals:   BP 140/92  Pulse 69  Temp(Src) 98.7 F (37.1 C) (Oral)  Resp 18  Ht 6' (1.829 m)  Wt 217 lb 12.6 oz (98.788 kg)  BMI 29.53 kg/m2  SpO2 100%  Discharge Labs:  Results for orders placed during the hospital encounter of 08/27/13 (from  the past 24 hour(s))  GLUCOSE, CAPILLARY     Status: Abnormal   Collection Time    08/28/13  5:35 PM      Result Value Range   Glucose-Capillary 185 (*) 70 - 99 mg/dL  GLUCOSE, CAPILLARY     Status: Abnormal   Collection Time    08/28/13  8:26 PM      Result Value Range   Glucose-Capillary 194 (*) 70 - 99 mg/dL   Comment 1 Notify RN    BASIC METABOLIC PANEL     Status: Abnormal   Collection Time    08/29/13  3:25 AM      Result Value Range   Sodium 134 (*) 137 - 147 mEq/L   Potassium 4.4  3.7 - 5.3 mEq/L   Chloride 97  96 - 112 mEq/L   CO2 24  19 - 32 mEq/L   Glucose, Bld 152 (*) 70 - 99 mg/dL   BUN 28 (*) 6 - 23 mg/dL   Creatinine, Ser 1.71 (*) 0.50 - 1.35 mg/dL   Calcium 9.2  8.4 - 10.5 mg/dL   GFR calc non Af Amer 40 (*) >90 mL/min   GFR calc Af Amer 47 (*) >90 mL/min  GLUCOSE, CAPILLARY     Status: Abnormal   Collection Time    08/29/13  7:40 AM      Result Value Range   Glucose-Capillary 127 (*) 70 - 99 mg/dL  GLUCOSE, CAPILLARY     Status: Abnormal   Collection Time    08/29/13 11:32 AM      Result Value Range   Glucose-Capillary 149 (*) 70 - 99 mg/dL    Signed: Rebecca Eaton, MD 08/29/2013, 3:56 PM   Time Spent on Discharge: 35 minutes Services Ordered on Discharge: none Equipment Ordered on Discharge: none

## 2013-08-31 NOTE — Discharge Summary (Signed)
I saw [name] on day of discharge and assisted in the discharge planning.

## 2013-09-02 LAB — CULTURE, BLOOD (ROUTINE X 2)
CULTURE: NO GROWTH
Culture: NO GROWTH

## 2013-09-05 ENCOUNTER — Inpatient Hospital Stay (HOSPITAL_COMMUNITY): Payer: Medicare (Managed Care)

## 2013-09-05 ENCOUNTER — Encounter (HOSPITAL_COMMUNITY): Payer: Self-pay | Admitting: Internal Medicine

## 2013-09-05 ENCOUNTER — Observation Stay (HOSPITAL_COMMUNITY)
Admission: AD | Admit: 2013-09-05 | Discharge: 2013-09-07 | Disposition: A | Discharge status: Home / Self Care | Payer: Medicare (Managed Care) | Source: Ambulatory Visit | Attending: Internal Medicine | Admitting: Internal Medicine

## 2013-09-05 DIAGNOSIS — IMO0002 Reserved for concepts with insufficient information to code with codable children: Secondary | ICD-10-CM

## 2013-09-05 DIAGNOSIS — I129 Hypertensive chronic kidney disease with stage 1 through stage 4 chronic kidney disease, or unspecified chronic kidney disease: Secondary | ICD-10-CM | POA: Insufficient documentation

## 2013-09-05 DIAGNOSIS — Z950 Presence of cardiac pacemaker: Secondary | ICD-10-CM

## 2013-09-05 DIAGNOSIS — E46 Unspecified protein-calorie malnutrition: Secondary | ICD-10-CM | POA: Diagnosis present

## 2013-09-05 DIAGNOSIS — I119 Hypertensive heart disease without heart failure: Secondary | ICD-10-CM

## 2013-09-05 DIAGNOSIS — N189 Chronic kidney disease, unspecified: Secondary | ICD-10-CM

## 2013-09-05 DIAGNOSIS — F32A Depression, unspecified: Secondary | ICD-10-CM

## 2013-09-05 DIAGNOSIS — R52 Pain, unspecified: Secondary | ICD-10-CM | POA: Insufficient documentation

## 2013-09-05 DIAGNOSIS — M25561 Pain in right knee: Secondary | ICD-10-CM | POA: Diagnosis present

## 2013-09-05 DIAGNOSIS — I4891 Unspecified atrial fibrillation: Secondary | ICD-10-CM

## 2013-09-05 DIAGNOSIS — I959 Hypotension, unspecified: Principal | ICD-10-CM | POA: Diagnosis present

## 2013-09-05 DIAGNOSIS — E0822 Diabetes mellitus due to underlying condition with diabetic chronic kidney disease: Secondary | ICD-10-CM | POA: Diagnosis present

## 2013-09-05 DIAGNOSIS — N183 Chronic kidney disease, stage 3 unspecified: Secondary | ICD-10-CM | POA: Insufficient documentation

## 2013-09-05 DIAGNOSIS — N452 Orchitis: Secondary | ICD-10-CM | POA: Insufficient documentation

## 2013-09-05 DIAGNOSIS — I480 Paroxysmal atrial fibrillation: Secondary | ICD-10-CM | POA: Diagnosis present

## 2013-09-05 DIAGNOSIS — G4733 Obstructive sleep apnea (adult) (pediatric): Secondary | ICD-10-CM

## 2013-09-05 DIAGNOSIS — N1832 Chronic kidney disease, stage 3b: Secondary | ICD-10-CM | POA: Diagnosis present

## 2013-09-05 DIAGNOSIS — R9431 Abnormal electrocardiogram [ECG] [EKG]: Secondary | ICD-10-CM | POA: Insufficient documentation

## 2013-09-05 DIAGNOSIS — E785 Hyperlipidemia, unspecified: Secondary | ICD-10-CM | POA: Insufficient documentation

## 2013-09-05 DIAGNOSIS — M25569 Pain in unspecified knee: Secondary | ICD-10-CM | POA: Insufficient documentation

## 2013-09-05 DIAGNOSIS — H548 Legal blindness, as defined in USA: Secondary | ICD-10-CM | POA: Insufficient documentation

## 2013-09-05 DIAGNOSIS — N179 Acute kidney failure, unspecified: Secondary | ICD-10-CM | POA: Insufficient documentation

## 2013-09-05 DIAGNOSIS — N508 Other specified disorders of male genital organs: Secondary | ICD-10-CM | POA: Insufficient documentation

## 2013-09-05 DIAGNOSIS — I43 Cardiomyopathy in diseases classified elsewhere: Secondary | ICD-10-CM

## 2013-09-05 DIAGNOSIS — E1149 Type 2 diabetes mellitus with other diabetic neurological complication: Secondary | ICD-10-CM | POA: Insufficient documentation

## 2013-09-05 DIAGNOSIS — Z794 Long term (current) use of insulin: Secondary | ICD-10-CM

## 2013-09-05 DIAGNOSIS — F329 Major depressive disorder, single episode, unspecified: Secondary | ICD-10-CM

## 2013-09-05 DIAGNOSIS — I2510 Atherosclerotic heart disease of native coronary artery without angina pectoris: Secondary | ICD-10-CM | POA: Insufficient documentation

## 2013-09-05 DIAGNOSIS — I517 Cardiomegaly: Secondary | ICD-10-CM | POA: Insufficient documentation

## 2013-09-05 DIAGNOSIS — E1165 Type 2 diabetes mellitus with hyperglycemia: Secondary | ICD-10-CM

## 2013-09-05 DIAGNOSIS — E1142 Type 2 diabetes mellitus with diabetic polyneuropathy: Secondary | ICD-10-CM | POA: Insufficient documentation

## 2013-09-05 HISTORY — DX: Hypotension, unspecified: I95.9

## 2013-09-05 HISTORY — DX: Unspecified atrial fibrillation: I48.91

## 2013-09-05 LAB — HEPATIC FUNCTION PANEL
ALT: 67 U/L — AB (ref 0–53)
AST: 63 U/L — ABNORMAL HIGH (ref 0–37)
Albumin: 2.4 g/dL — ABNORMAL LOW (ref 3.5–5.2)
Alkaline Phosphatase: 125 U/L — ABNORMAL HIGH (ref 39–117)
BILIRUBIN DIRECT: 0.2 mg/dL (ref 0.0–0.3)
BILIRUBIN INDIRECT: 0.3 mg/dL (ref 0.3–0.9)
Total Bilirubin: 0.5 mg/dL (ref 0.3–1.2)
Total Protein: 7.2 g/dL (ref 6.0–8.3)

## 2013-09-05 LAB — APTT: APTT: 36 s (ref 24–37)

## 2013-09-05 LAB — URINALYSIS, ROUTINE W REFLEX MICROSCOPIC
BILIRUBIN URINE: NEGATIVE
Glucose, UA: NEGATIVE mg/dL
Hgb urine dipstick: NEGATIVE
KETONES UR: NEGATIVE mg/dL
Nitrite: NEGATIVE
Protein, ur: NEGATIVE mg/dL
SPECIFIC GRAVITY, URINE: 1.017 (ref 1.005–1.030)
Urobilinogen, UA: 0.2 mg/dL (ref 0.0–1.0)
pH: 5 (ref 5.0–8.0)

## 2013-09-05 LAB — URIC ACID: Uric Acid, Serum: 5.5 mg/dL (ref 4.0–7.8)

## 2013-09-05 LAB — CBC WITH DIFFERENTIAL/PLATELET
BASOS PCT: 0 % (ref 0–1)
Basophils Absolute: 0 10*3/uL (ref 0.0–0.1)
EOS PCT: 0 % (ref 0–5)
Eosinophils Absolute: 0 10*3/uL (ref 0.0–0.7)
HEMATOCRIT: 33 % — AB (ref 39.0–52.0)
HEMOGLOBIN: 10.5 g/dL — AB (ref 13.0–17.0)
Lymphocytes Relative: 16 % (ref 12–46)
Lymphs Abs: 2.2 10*3/uL (ref 0.7–4.0)
MCH: 26.5 pg (ref 26.0–34.0)
MCHC: 31.8 g/dL (ref 30.0–36.0)
MCV: 83.3 fL (ref 78.0–100.0)
MONO ABS: 1.8 10*3/uL — AB (ref 0.1–1.0)
MONOS PCT: 13 % — AB (ref 3–12)
NEUTROS ABS: 10 10*3/uL — AB (ref 1.7–7.7)
Neutrophils Relative %: 71 % (ref 43–77)
Platelets: 197 10*3/uL (ref 150–400)
RBC: 3.96 MIL/uL — ABNORMAL LOW (ref 4.22–5.81)
RDW: 14.9 % (ref 11.5–15.5)
WBC: 14 10*3/uL — ABNORMAL HIGH (ref 4.0–10.5)

## 2013-09-05 LAB — MAGNESIUM: MAGNESIUM: 2.2 mg/dL (ref 1.5–2.5)

## 2013-09-05 LAB — BASIC METABOLIC PANEL
BUN: 34 mg/dL — ABNORMAL HIGH (ref 6–23)
CHLORIDE: 98 meq/L (ref 96–112)
CO2: 22 mEq/L (ref 19–32)
Calcium: 8.8 mg/dL (ref 8.4–10.5)
Creatinine, Ser: 2.23 mg/dL — ABNORMAL HIGH (ref 0.50–1.35)
GFR calc Af Amer: 34 mL/min — ABNORMAL LOW (ref >90)
GFR calc non Af Amer: 29 mL/min — ABNORMAL LOW (ref >90)
Glucose, Bld: 214 mg/dL — ABNORMAL HIGH (ref 70–99)
POTASSIUM: 4.4 meq/L (ref 3.7–5.3)
Sodium: 137 mEq/L (ref 137–147)

## 2013-09-05 LAB — PHOSPHORUS: Phosphorus: 3.9 mg/dL (ref 2.3–4.6)

## 2013-09-05 LAB — URINE MICROSCOPIC-ADD ON

## 2013-09-05 LAB — PROTIME-INR
INR: 1.3 (ref 0.00–1.49)
PROTHROMBIN TIME: 15.9 s — AB (ref 11.6–15.2)

## 2013-09-05 LAB — LACTIC ACID, PLASMA: Lactic Acid, Venous: 2.4 mmol/L — ABNORMAL HIGH (ref 0.5–2.2)

## 2013-09-05 LAB — GLUCOSE, CAPILLARY: Glucose-Capillary: 167 mg/dL — ABNORMAL HIGH (ref 70–99)

## 2013-09-05 MED ORDER — VANCOMYCIN HCL 10 G IV SOLR
2500.0000 mg | Freq: Once | INTRAVENOUS | Status: DC
  Filled 2013-09-05: qty 2500

## 2013-09-05 MED ORDER — METHYLCELLULOSE 1 % OP SOLN: 1.0000 [drp] | Freq: Two times a day (BID) | OPHTHALMIC | Status: DC

## 2013-09-05 MED ORDER — OMEPRAZOLE MAGNESIUM 20 MG PO TBEC: 20.0000 mg | DELAYED_RELEASE_TABLET | Freq: Every evening | ORAL | Status: DC

## 2013-09-05 MED ORDER — BRIMONIDINE TARTRATE 0.15 % OP SOLN
1.0000 [drp] | Freq: Three times a day (TID) | OPHTHALMIC | Status: DC
  Filled 2013-09-05: qty 5

## 2013-09-05 MED ORDER — PIPERACILLIN-TAZOBACTAM 3.375 G IVPB 30 MIN
3.3750 g | Freq: Once | INTRAVENOUS | Status: DC
  Filled 2013-09-05: qty 50

## 2013-09-05 MED ORDER — SENNOSIDES-DOCUSATE SODIUM 8.6-50 MG PO TABS: 2.0000 | ORAL_TABLET | Freq: Every day | ORAL | Status: DC | PRN

## 2013-09-05 MED ORDER — HEPARIN SODIUM (PORCINE) 5000 UNIT/ML IJ SOLN
5000.0000 [IU] | Freq: Three times a day (TID) | INTRAMUSCULAR | Status: DC
  Administered 2013-09-06 – 2013-09-07 (×5): 5000 [IU] via SUBCUTANEOUS
  Filled 2013-09-05 (×8): qty 1

## 2013-09-05 MED ORDER — ALBUTEROL SULFATE (2.5 MG/3ML) 0.083% IN NEBU: 3.0000 mL | INHALATION_SOLUTION | Freq: Four times a day (QID) | RESPIRATORY_TRACT | Status: DC | PRN

## 2013-09-05 MED ORDER — ATORVASTATIN CALCIUM 80 MG PO TABS
80.0000 mg | ORAL_TABLET | Freq: Every day | ORAL | Status: DC
  Administered 2013-09-06: 80 mg via ORAL
  Filled 2013-09-05 (×2): qty 1

## 2013-09-05 MED ORDER — HYDROCODONE-ACETAMINOPHEN 5-325 MG PO TABS: 1.0000 | ORAL_TABLET | Freq: Four times a day (QID) | ORAL | Status: DC | PRN

## 2013-09-05 MED ORDER — ASPIRIN 81 MG PO CHEW: 81.0000 mg | CHEWABLE_TABLET | Freq: Every day | ORAL | Status: DC

## 2013-09-05 MED ORDER — ASPIRIN 81 MG PO CHEW
162.0000 mg | CHEWABLE_TABLET | Freq: Every day | ORAL | Status: DC
  Administered 2013-09-06 – 2013-09-07 (×2): 162 mg via ORAL
  Filled 2013-09-05 (×2): qty 2

## 2013-09-05 MED ORDER — VITAMIN D3 25 MCG (1000 UNIT) PO TABS
1000.0000 [IU] | ORAL_TABLET | Freq: Every day | ORAL | Status: DC
  Administered 2013-09-06 (×2): 1000 [IU] via ORAL
  Filled 2013-09-05 (×4): qty 1

## 2013-09-05 MED ORDER — BRIMONIDINE TARTRATE 0.2 % OP SOLN
1.0000 [drp] | Freq: Three times a day (TID) | OPHTHALMIC | Status: DC
  Administered 2013-09-06 – 2013-09-07 (×5): 1 [drp] via OPHTHALMIC
  Filled 2013-09-05: qty 5

## 2013-09-05 MED ORDER — CARBAMAZEPINE 200 MG PO TABS
200.0000 mg | ORAL_TABLET | Freq: Two times a day (BID) | ORAL | Status: DC
  Administered 2013-09-06 – 2013-09-07 (×4): 200 mg via ORAL
  Filled 2013-09-05 (×5): qty 1

## 2013-09-05 MED ORDER — ASPIRIN 81 MG PO CHEW: 162.0000 mg | CHEWABLE_TABLET | Freq: Every day | ORAL | Status: DC

## 2013-09-05 MED ORDER — AMIODARONE HCL 200 MG PO TABS
200.0000 mg | ORAL_TABLET | Freq: Every day | ORAL | Status: DC
  Administered 2013-09-06 – 2013-09-07 (×3): 200 mg via ORAL
  Filled 2013-09-05 (×3): qty 1

## 2013-09-05 MED ORDER — SODIUM CHLORIDE 0.9 % IV SOLN
INTRAVENOUS | Status: AC
  Administered 2013-09-05: 22:00:00 via INTRAVENOUS
  Administered 2013-09-06: 1000 mL via INTRAVENOUS

## 2013-09-05 MED ORDER — INSULIN ASPART 100 UNIT/ML ~~LOC~~ SOLN
0.0000 [IU] | Freq: Three times a day (TID) | SUBCUTANEOUS | Status: DC
  Administered 2013-09-06: 5 [IU] via SUBCUTANEOUS
  Administered 2013-09-06: 3 [IU] via SUBCUTANEOUS
  Administered 2013-09-06: 2 [IU] via SUBCUTANEOUS
  Administered 2013-09-07 (×2): 3 [IU] via SUBCUTANEOUS

## 2013-09-05 MED ORDER — ENOXAPARIN SODIUM 40 MG/0.4ML ~~LOC~~ SOLN
40.0000 mg | SUBCUTANEOUS | Status: DC
  Filled 2013-09-05: qty 0.4

## 2013-09-05 MED ORDER — PIPERACILLIN-TAZOBACTAM 3.375 G IVPB
3.3750 g | Freq: Three times a day (TID) | INTRAVENOUS | Status: DC
  Filled 2013-09-05: qty 50

## 2013-09-05 MED ORDER — DORZOLAMIDE HCL-TIMOLOL MAL 2-0.5 % OP SOLN
1.0000 [drp] | Freq: Two times a day (BID) | OPHTHALMIC | Status: DC
  Administered 2013-09-06 – 2013-09-07 (×4): 1 [drp] via OPHTHALMIC
  Filled 2013-09-05: qty 10

## 2013-09-05 MED ORDER — POLYETHYLENE GLYCOL 3350 17 G PO PACK
17.0000 g | PACK | Freq: Every day | ORAL | Status: DC | PRN
  Filled 2013-09-05: qty 1

## 2013-09-05 MED ORDER — POLYETHYLENE GLYCOL 3350 17 G PO PACK: 17.0000 g | PACK | Freq: Every day | ORAL | Status: DC

## 2013-09-05 MED ORDER — ACETAMINOPHEN 325 MG PO TABS: 650.0000 mg | ORAL_TABLET | ORAL | Status: DC | PRN

## 2013-09-05 MED ORDER — VANCOMYCIN HCL 10 G IV SOLR: 1750.0000 mg | INTRAVENOUS | Status: DC

## 2013-09-05 MED ORDER — SODIUM CHLORIDE 0.9 % IV SOLN: INTRAVENOUS | Status: DC

## 2013-09-05 MED ORDER — POLYVINYL ALCOHOL 1.4 % OP SOLN
1.0000 [drp] | Freq: Two times a day (BID) | OPHTHALMIC | Status: DC
  Administered 2013-09-06 – 2013-09-07 (×2): 1 [drp] via OPHTHALMIC
  Filled 2013-09-05 (×2): qty 15

## 2013-09-05 MED ORDER — PANTOPRAZOLE SODIUM 40 MG PO TBEC
40.0000 mg | DELAYED_RELEASE_TABLET | Freq: Every day | ORAL | Status: DC
  Administered 2013-09-05 – 2013-09-07 (×3): 40 mg via ORAL
  Filled 2013-09-05 (×3): qty 1

## 2013-09-05 MED ORDER — SODIUM CHLORIDE 0.9 % IV BOLUS (SEPSIS): 1000.0000 mL | INTRAVENOUS | Status: DC | PRN

## 2013-09-05 NOTE — H&P (Signed)
  Date: 09/05/2013               Patient Name:  [name] MRN: [id number]  DOB: [birth year] Age / Sex: 68 y.o., male   PCP: [name], MD              Medical Service: Internal Medicine Teaching Service     I have reviewed the note by Maryclare Bean, MS3 and was present during the interview and physical exam.  Please separate note for findings, assessment, and plan.   Signed: Ivin Poot, MD 09/05/2013, 8:21 PM

## 2013-09-05 NOTE — Progress Notes (Signed)
Admission note:  Arrival Method: direct admit from clinic Mental Orientation: A&Ox4 Telemetry: N/A Assessment: See docflowsheets Skin: Scrotum red IV: N/A Pain: R & L leg pain Tubes: N/A Safety Measures: Patient Handbook has been given, and discussed the Fall Prevention worksheet. Left at bedside  Admission: Completed and admission orders have been written  6E Orientation: Patient has been oriented to the unit, staff and to the room.  Family: Nonet the bedside

## 2013-09-05 NOTE — Progress Notes (Signed)
ANTIBIOTIC CONSULT NOTE - INITIAL  Pharmacy Consult for Vancocin and Zosyn Indication: rule out sepsis  No Known Allergies  Patient Measurements: Height: 6' (182.9 cm) Weight: 286 lb 8 oz (129.956 kg) IBW/kg (Calculated) : 77.6  Vital Signs: Temp: 97.4 F (36.3 C) (01/21 2142) Temp src: Oral (01/21 2142) BP: 98/64 mmHg (01/21 2142) Pulse Rate: 71 (01/21 2142)  Labs:  Recent Labs  09/05/13 1944  WBC 14.0*  HGB 10.5*  PLT 197  CREATININE 2.23*   Estimated Creatinine Clearance: 46.1 ml/min (by C-G formula based on Cr of 2.23).   Microbiology: Recent Results (from the past 720 hour(s))  URINE CULTURE     Status: None   Collection Time    08/27/13  4:44 PM      Result Value Range Status   Specimen Description URINE, CATHETERIZED   Final   Special Requests NONE   Final   Culture  Setup Time     Final   Value: 08/28/2013 02:24     Performed at [facility]     Final   Value: NO GROWTH     Performed at [facility]   Culture     Final   Value: NO GROWTH     Performed at [facility]   Report Status 08/29/2013 FINAL   Final  CULTURE, BLOOD (ROUTINE X 2)     Status: None   Collection Time    08/27/13  5:15 PM      Result Value Range Status   Specimen Description BLOOD ARM LEFT   Final   Special Requests BOTTLES DRAWN AEROBIC AND ANAEROBIC 10CC   Final   Culture  Setup Time     Final   Value: 08/27/2013 21:57     Performed at [facility]   Culture     Final   Value: NO GROWTH 5 DAYS     Performed at [facility]   Report Status 09/02/2013 FINAL   Final  CULTURE, BLOOD (ROUTINE X 2)     Status: None   Collection Time    08/27/13  5:25 PM      Result Value Range Status   Specimen Description BLOOD HAND LEFT   Final   Special Requests     Final   Value: BOTTLES DRAWN AEROBIC AND ANAEROBIC 10CCBLUE 5CCRED   Culture  Setup Time     Final   Value: 08/27/2013 21:57     Performed at [facility]   Culture     Final   Value: NO GROWTH 5 DAYS     Performed at [facility]   Report Status 09/02/2013 FINAL   Final    Medical History: Past Medical History  Diagnosis Date  . Diabetes type 2, uncontrolled   . Gout   . Hyperlipidemia   . Hypertension   . Obstructive sleep apnea     On CPAP. Last sleep study (06/2009) - Severe obstructive sleep apnea/hypopnea syndrome, apnea-hypopnea index 70.5 per hour with non positional events moderately loud snoring and oxygen desaturation to a nadir of 85% on room air.  . Benign prostatic hypertrophy   . Morbid obesity   . Cardiomyopathy, hypertensive      diastolic dysfunction by [id number] echo  . Coronary artery disease      nonobstructive. 2 vessel. cardiac catheter in March 2005 showing 60% LAD occlusion, 30% circumflex occlusion.  . 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  Legally blind   . Herpes   . Occult blood positive stool      History of in August 2007.  colonoscopy in January 2008 showing normal colon with fair inadequate prep. By Dr. Silvano Rusk.  . Incarcerated ventral hernia     H/O. S/P repair ventral hernia repair 07/2005.  . Diabetic ulcer of thigh     H/O Rt thigh ulcer.  . Diabetic peripheral neuropathy   . Exertional shortness of breath     "comes and goes" (06/23/2013)  . GERD (gastroesophageal reflux disease)   . Degenerative joint disease      Right knee.  . Arthritis     "all over" (06/23/2013)  . Sepsis 08/26/2013  . Atrial fibrillation     not Candidate for Coumadin d/t rectal bleeding    Medications:  Prescriptions prior to admission  Medication Sig Dispense Refill  . albuterol (PROVENTIL HFA;VENTOLIN HFA) 108 (90 BASE) MCG/ACT inhaler Inhale 2 puffs into the lungs every 6 (six) hours as needed for wheezing.      Marland Kitchen amiodarone (PACERONE) 200 MG tablet Take 200 mg by mouth daily.      Marland Kitchen aspirin 81 MG chewable tablet Chew 81 mg by mouth daily.      . brimonidine (ALPHAGAN P) 0.1 % SOLN Place 1  drop into both eyes every 8 (eight) hours.      . carbamazepine (TEGRETOL) 200 MG tablet Take 200 mg by mouth 2 (two) times daily.      . carvedilol (COREG) 25 MG tablet Take 25 mg by mouth 2 (two) times daily with a meal.       . cholecalciferol (VITAMIN D) 1000 UNITS tablet Take 1,000 Units by mouth at bedtime.      . ciprofloxacin (CIPRO) 250 MG tablet Take 1 tablet (250 mg total) by mouth 2 (two) times daily.  17 tablet  0  . cloNIDine (CATAPRES) 0.2 MG tablet Take 0.2 mg by mouth 2 (two) times daily.      . colchicine 0.6 MG tablet Take 0.6 mg by mouth 2 (two) times daily as needed (for gout flare-ups).       . dorzolamide-timolol (COSOPT) 22.3-6.8 MG/ML ophthalmic solution Place 1 drop into both eyes 2 (two) times daily.      Marland Kitchen exenatide (BYETTA) 10 MCG/0.04ML SOPN injection Inject 10 mcg into the skin 2 (two) times daily with a meal.      . febuxostat (ULORIC) 40 MG tablet Take 40 mg by mouth at bedtime.      . furosemide (LASIX) 40 MG tablet Take 40 mg by mouth 2 (two) times daily.       Marland Kitchen HYDROcodone-acetaminophen (NORCO) 5-325 MG per tablet Take 1-2 tablets by mouth every 6 (six) hours as needed.  30 tablet  0  . insulin aspart (NOVOLOG) 100 UNIT/ML injection Inject 8 Units into the skin 2 (two) times daily before a meal.       . insulin glargine (LANTUS) 100 UNIT/ML injection Inject 0.26 mLs (26 Units total) into the skin at bedtime.  10 mL  11  . isosorbide mononitrate (IMDUR) 30 MG 24 hr tablet Take 30 mg by mouth daily.      Marland Kitchen lisinopril (PRINIVIL,ZESTRIL) 20 MG tablet Take 20 mg by mouth at bedtime.       . magnesium oxide (MAG-OX) 400 MG tablet Take 400 mg by mouth daily.      . methylcellulose (ARTIFICIAL TEARS) 1 % ophthalmic solution Place 1 drop into both  eyes 2 (two) times daily.      Marland Kitchen omeprazole (PRILOSEC OTC) 20 MG tablet Take 20 mg by mouth every evening.       . polyethylene glycol (MIRALAX / GLYCOLAX) packet Take 17 g by mouth daily.       . potassium chloride SA  (K-DUR,KLOR-CON) 20 MEQ tablet Take 20 mEq by mouth 2 (two) times daily.       . rosuvastatin (CRESTOR) 40 MG tablet Take 40 mg by mouth every evening.      . sennosides-docusate sodium (SENOKOT-S) 8.6-50 MG tablet Take 2 tablets by mouth daily.      Marland Kitchen glucose blood test strip 1 each by Other route as needed. Use as instructed to check blood sugar before meals and at bedtime at least 2-3 time daily      . Insulin Pen Needle 31G X 8 MM MISC Use to inject insulin 2x daily before breakfast and dinner.       Scheduled:  . amiodarone  200 mg Oral Daily  . [START ON 09/06/2013] aspirin  162 mg Oral Daily  . [START ON 09/06/2013] atorvastatin  80 mg Oral q1800  . brimonidine  1 drop Both Eyes TID  . carbamazepine  200 mg Oral BID  . cholecalciferol  1,000 Units Oral QHS  . dorzolamide-timolol  1 drop Both Eyes BID  . heparin subcutaneous  5,000 Units Subcutaneous Q8H  . [START ON 09/06/2013] insulin aspart  0-15 Units Subcutaneous TID WC  . pantoprazole  40 mg Oral Daily  . piperacillin-tazobactam  3.375 g Intravenous Once  . [START ON 09/06/2013] piperacillin-tazobactam (ZOSYN)  IV  3.375 g Intravenous Q8H  . polyvinyl alcohol  1 drop Both Eyes BID  . [START ON 09/06/2013] vancomycin  1,750 mg Intravenous Q24H  . vancomycin  2,500 mg Intravenous Once   Infusions:  . sodium chloride      Assessment: 68yo male c/o worsening knee pain over 2wk, had recent admission and currently hypotensive, concern for sepsis, to begin IV ABX empirically.  Goal of Therapy:  Vancomycin trough level 15-20 mcg/ml  Plan:  Will give vancomycin 2500mg  IV x1 followed by 1750mg  IV Q24H as well as Zosyn 3.375g IV Q8H and monitor CBC, Cx, levels prn.  Wynona Neat, PharmD, BCPS  09/05/2013,10:57 PM

## 2013-09-05 NOTE — H&P (Signed)
Date: 09/05/2013               Patient Name:  [name] MRN: [id number]  DOB: [birth year] Age / Sex: 68 y.o., male   PCP: Ryan Adie, MD         Medical Service: Internal Medicine Teaching Service         Attending Physician: Dr. Marinda Elk    First Contact: Dr. Mechele Claude  Pager: [phone]  Second Contact: Dr. Eula Fried Pager: [phone]       After Hours (After 5p/  First Contact Pager: [phone]  weekends / holidays): Second Contact Pager: [phone]   Chief Complaint: hypotension, right knee pain   History of Present Illness:  [name] is a 68 year old man with uncontrolled DM2 (A1C 8.2% on 08/28/13) c/b severe peripheral neuropathy, HTN, HL, CAD (2 vessel, nonobstructive), atrial fibrillation, s/p dual chamber pacemaker in 2005 for SA node dysfunction, gout, DJD of right knee, BPH, OSA on bipap, macular degeneration (legally blind) who presents from [place] with hypotension not resolved with IVFs.  Patient was recently discharged on 08/29/13 after admission for sepsis 2/2 UTI vs. orchitis, acute on CKD, also found to be influenza + and with trichomonas on UA.   Patient was seen at [facility] today where his BP was noted to be 93/36.  He was given NS 1 L bolus x 2 with resolution of hypotension to 140/85.  However, after unknown amount of time, BP was rechecked and found to be 90/40; provider also felt that patient was somewhat lethargic.  Intake vitals here showed BP of 86/48. However, patient is not symptomatic as he denies lightheadedness, palpitations, fatigue.  He also denies headaches, chest pain, shortness of breath, cough, abdominal pain, nausea/vomiting, diarrhea.  No urinary symptoms including dysuria, frequency, hematuria.  No loss of consciousness or recent falls.   Patient's only complaint is pain in his right knee.  He has chronic pain in that knee thought due to DJD though he states it has been worse for past 2 weeks.  He does not have any pain above or below his knee.  Denies  fevers and other joint pain, cannot comment on redness since he does not see well.  States that the pain has been present since before his last hospitalization but worsening as he usually is able to ambulate with walker at home but has not been able to bear weight on that knee recently.  He has been lying in bed more as a result but denies pain or swelling of his calf.    Patient states he completed the full course of ciprofloxacin and Tamiflu that he has prescribed at his last hospital discharge.  However, he is unable to provide much information with regard to his medications as he just takes whatever is in his "bubble packet" from [place] (legally blind).  He did take all of his medications this morning.  He lives with two friends at home, neither of which have been sick.   Of note, per cardiology note from 05/2013, his pacemaker shows persistent atrial fibrillation since 01/2013.  He was started on amiodarone at that time but has remained in arrythmia though rate controlled.  He no longer takes Coumadin as it was felt that he is not a good candidate thus he should be on ASA 162 mg daily.  He was to be seen for follow-up next month.     Meds: Current Facility-Administered Medications  Medication Dose Route Frequency Provider Last Rate Last Dose  .  0.9 %  sodium chloride infusion   Intravenous Continuous Cresenciano Genre, MD      . albuterol (PROVENTIL) (2.5 MG/3ML) 0.083% nebulizer solution 3 mL  3 mL Inhalation Q6H PRN Cresenciano Genre, MD      . aspirin chewable tablet 81 mg  81 mg Oral Daily Cresenciano Genre, MD      . enoxaparin (LOVENOX) injection 40 mg  40 mg Subcutaneous Q24H Cresenciano Genre, MD      . Derrill Memo ON 09/06/2013] insulin aspart (novoLOG) injection 0-15 Units  0-15 Units Subcutaneous TID WC Cresenciano Genre, MD      . pantoprazole (PROTONIX) EC tablet 40 mg  40 mg Oral Daily Dominic Pea, DO       Medications Prior to Admission  Medication Sig Dispense Refill  . albuterol (PROVENTIL  HFA;VENTOLIN HFA) 108 (90 BASE) MCG/ACT inhaler Inhale 2 puffs into the lungs every 6 (six) hours as needed for wheezing.      Marland Kitchen amiodarone (PACERONE) 200 MG tablet Take 200 mg by mouth daily.      Marland Kitchen aspirin 81 MG chewable tablet Chew 81 mg by mouth daily.      . brimonidine (ALPHAGAN P) 0.1 % SOLN Place 1 drop into both eyes every 8 (eight) hours.      . carbamazepine (TEGRETOL) 200 MG tablet Take 200 mg by mouth 2 (two) times daily.      . carvedilol (COREG) 25 MG tablet Take 25 mg by mouth 2 (two) times daily with a meal.       . cholecalciferol (VITAMIN D) 1000 UNITS tablet Take 1,000 Units by mouth at bedtime.      . ciprofloxacin (CIPRO) 250 MG tablet Take 1 tablet (250 mg total) by mouth 2 (two) times daily.  17 tablet  0  . cloNIDine (CATAPRES) 0.2 MG tablet Take 0.2 mg by mouth 2 (two) times daily.      . colchicine 0.6 MG tablet Take 0.6 mg by mouth 2 (two) times daily as needed (for gout flare-ups).       . dorzolamide-timolol (COSOPT) 22.3-6.8 MG/ML ophthalmic solution Place 1 drop into both eyes 2 (two) times daily.      Marland Kitchen exenatide (BYETTA) 10 MCG/0.04ML SOPN injection Inject 10 mcg into the skin 2 (two) times daily with a meal.      . febuxostat (ULORIC) 40 MG tablet Take 40 mg by mouth at bedtime.      . furosemide (LASIX) 40 MG tablet Take 40 mg by mouth 2 (two) times daily.       Marland Kitchen HYDROcodone-acetaminophen (NORCO) 5-325 MG per tablet Take 1-2 tablets by mouth every 6 (six) hours as needed.  30 tablet  0  . insulin aspart (NOVOLOG) 100 UNIT/ML injection Inject 8 Units into the skin 2 (two) times daily before a meal.       . insulin glargine (LANTUS) 100 UNIT/ML injection Inject 0.26 mLs (26 Units total) into the skin at bedtime.  10 mL  11  . isosorbide mononitrate (IMDUR) 30 MG 24 hr tablet Take 30 mg by mouth daily.      Marland Kitchen lisinopril (PRINIVIL,ZESTRIL) 20 MG tablet Take 20 mg by mouth at bedtime.       . magnesium oxide (MAG-OX) 400 MG tablet Take 400 mg by mouth daily.      .  methylcellulose (ARTIFICIAL TEARS) 1 % ophthalmic solution Place 1 drop into both eyes 2 (two) times daily.      Marland Kitchen  omeprazole (PRILOSEC OTC) 20 MG tablet Take 20 mg by mouth every evening.       . polyethylene glycol (MIRALAX / GLYCOLAX) packet Take 17 g by mouth daily.       . potassium chloride SA (K-DUR,KLOR-CON) 20 MEQ tablet Take 20 mEq by mouth 2 (two) times daily.       . rosuvastatin (CRESTOR) 40 MG tablet Take 40 mg by mouth every evening.      . sennosides-docusate sodium (SENOKOT-S) 8.6-50 MG tablet Take 2 tablets by mouth daily.      Marland Kitchen glucose blood test strip 1 each by Other route as needed. Use as instructed to check blood sugar before meals and at bedtime at least 2-3 time daily      . Insulin Pen Needle 31G X 8 MM MISC Use to inject insulin 2x daily before breakfast and dinner.        Allergies: Allergies as of 09/05/2013  . (No Known Allergies)   Past Medical History  Diagnosis Date  . Diabetes type 2, uncontrolled   . Gout   . Hyperlipidemia   . Hypertension   . Obstructive sleep apnea     On CPAP. Last sleep study (06/2009) - Severe obstructive sleep apnea/hypopnea syndrome, apnea-hypopnea index 70.5 per hour with non positional events moderately loud snoring and oxygen desaturation to a nadir of 85% on room air.  . Benign prostatic hypertrophy   . Morbid obesity   . Cardiomyopathy, hypertensive      diastolic dysfunction by [id number] echo  . Coronary artery disease      nonobstructive. 2 vessel. cardiac catheter in Nov 22, 2003 showing 60% LAD occlusion, 30% circumflex occlusion.  . Pacemaker 2003/11/22     secondary to bradycardia  . Legally blind   . Herpes   . Occult blood positive stool      History of in August 2007.  colonoscopy in January 2008 showing normal colon with fair inadequate prep. By Dr. Silvano Rusk.  . Incarcerated ventral hernia     H/O. S/P repair ventral hernia repair 07/2005.  . Diabetic ulcer of thigh     H/O Rt thigh ulcer.  . Diabetic peripheral  neuropathy   . Exertional shortness of breath     "comes and goes" (07/07/13)  . GERD (gastroesophageal reflux disease)   . Degenerative joint disease      Right knee.  . Arthritis     "all over" (July 07, 2013)  . Sepsis 08/26/2013   Past Surgical History  Procedure Laterality Date  . Pacemaker lead revision  12/2003     likely microdislodgment  of right ventricular lead  . Ventral hernia repair  07/2005     S/P laparotomy, lysis of adhesions, repair of incarcerated ventral hernia with  partial omentectomy.. Performed by Dr. Marlou Starks.  . Hernia repair    . Cataract extraction, bilateral Bilateral   . Refractive surgery Bilateral   . Eye surgery    . 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 Insert / replace / remove pacemaker  11/22/03    Secondary to symptomatic bradycardia. DDDR pacemaker. By Dr. Rollene Fare.  . Insert / replace / remove pacemaker  November 21, 2012    "old pacemaker died; had new one put in" (07-07-2013)  . Amputation Left 06/22/2013    Procedure: AMPUTATION FOOT;  Surgeon: Newt Minion, MD;  Location: [facility];  Service: Orthopedics;  Laterality: Left;  Left Midfoot Amputation   Family History  Problem Relation  Age of Onset  . Coronary artery disease Sister   . Heart disease Sister   . Allergies Sister   . Asthma Brother   . Rheum arthritis Brother    History   Social History  . Marital Status: Single    Spouse Name: N/A    Number of Children: Y  . Years of Education: N/A   Occupational History  . disabled    Social History Main Topics  . Smoking status: Former Smoker -- 0.00 packs/day for 0 years    Types: Cigarettes    Quit date: 08/17/2003  . Smokeless tobacco: Never Used     Comment: started at age 47.  only smoked on occ- very rarely.0  . Alcohol Use: Yes     Comment: 06/23/2013 "drank some; never had a problem w/it"  . Drug Use: No  . Sexual Activity: No   Other Topics Concern  . Not on file   Social History  Narrative  . No narrative on file  Denies tobacco, alcohol, and illicits.  Review of Systems: Review of Systems  Constitutional: Negative for fever, weight loss and malaise/fatigue.  Eyes:       Decreased vision (legally blind) at baseline  Respiratory: Negative for cough and shortness of breath.   Cardiovascular: Negative for chest pain, palpitations and leg swelling.  Gastrointestinal: Negative for heartburn, nausea, vomiting, abdominal pain, diarrhea, constipation and blood in stool.  Genitourinary: Negative for dysuria, frequency and hematuria.  Musculoskeletal: Positive for joint pain. Negative for falls and myalgias.       Right knee pain, chronic but worse  Neurological: Negative for dizziness, tingling, loss of consciousness, weakness and headaches.    Physical Exam: Blood pressure 92/47, pulse 71, temperature 97.1 F (36.2 C), temperature source Oral, resp. rate 16, SpO2 100.00%. General: alert, cooperative, NAD HEENT: NCAT, vision decreased at baseline (legally blind), mmm Neck: supple, no lymphadenopathy Lungs: clear to ascultation bilaterally, normal work of respiration, no wheezes, rales, ronchi Heart: regular rate and rhythm, no murmurs, gallops, or rubs Abdomen: soft, non-tender, non-distended, normal bowel sounds Extremities: right knee without edema or erythema, no calf pain, ?+Homan's sign, s/p left transmetatarsel amputation (clean, dry, intact without erythema or drainage), 2+ DP/PT pulses bilaterally, no cyanosis, clubbing, or edema Neurologic: alert & oriented X3, cranial nerves II-XII intact, strength grossly intact, sensation intact to light touch  Lab results: Basic Metabolic Panel: No results found for this basename: NA, K, CL, CO2, GLUCOSE, BUN, CREATININE, CALCIUM, MG, PHOS,  in the last 72 hours Liver Function Tests: No results found for this basename: AST, ALT, ALKPHOS, BILITOT, PROT, ALBUMIN,  in the last 72 hours No results found for this basename:  LIPASE, AMYLASE,  in the last 72 hours No results found for this basename: AMMONIA,  in the last 72 hours CBC: No results found for this basename: WBC, NEUTROABS, HGB, HCT, MCV, PLT,  in the last 72 hours Cardiac Enzymes: No results found for this basename: CKTOTAL, CKMB, CKMBINDEX, TROPONINI,  in the last 72 hours BNP: No results found for this basename: PROBNP,  in the last 72 hours D-Dimer: No results found for this basename: DDIMER,  in the last 72 hours CBG: No results found for this basename: GLUCAP,  in the last 72 hours Hemoglobin A1C: No results found for this basename: HGBA1C,  in the last 72 hours Fasting Lipid Panel: No results found for this basename: CHOL, HDL, LDLCALC, TRIG, CHOLHDL, LDLDIRECT,  in the last 72 hours Thyroid Function Tests:  No results found for this basename: TSH, T4TOTAL, FREET4, T3FREE, THYROIDAB,  in the last 72 hours Anemia Panel: No results found for this basename: VITAMINB12, FOLATE, FERRITIN, TIBC, IRON, RETICCTPCT,  in the last 72 hours Coagulation: No results found for this basename: LABPROT, INR,  in the last 72 hours  Alcohol Level: No results found for this basename: ETH,  in the last 72 hours Urinalysis: No results found for this basename: COLORURINE, APPERANCEUR, LABSPEC, [place], GLUCOSEU, HGBUR, BILIRUBINUR, KETONESUR, PROTEINUR, UROBILINOGEN, NITRITE, LEUKOCYTESUR,  in the last 72 hours  Imaging results:  No results found.  Other results: EKG: pending (ventricular pacing)  Assessment & Plan by Problem: #Hypotension- Unclear etiology, likely multifactorial.  Does not meet SIRS criteria; afebrile, no tachycardia or tachypnea, CBC pending.  Patient has uncontrolled diabetes with severe peripheral neuropathy thus very possible that he has component of autonomic dysfunction.  (Note: He appears to be on Tegretol for neuropathic pain.)  He is also very likely deconditioned due to his recent hospitalization and worsening knee pain resulting  in immobilization.  He also has had questionable recent PO intake (and decreased PO intake prior to last admission) though denies vomiting or diarrhea.  Lastly, he has many antihypertensive medications on board (see below), all of which he presumably took this morning.  Patient received NS 1 L bolus x 2 at [facility], will start continuous IVFs though patient did not appear dry on exam.  RN did attempt orthostatics on admission which showed BP 95/51l lying, 92/47 sitting; patient unable to stand (2/2 knee pain).   -admit to IMTS, med-surg -NS at 100 cc/hr x 12 hours then reassess -EKG pending -CMP, Mg, Phos, CBC, lactic acid pending -UA pending -GC/Chlamydia probe (though patient received abx during last admission and reportedly completed course of abx outpatient) -PT/OT consults  #Right knee pain- Likely due to chronic degenerative changes but due to recent increased pain, will obtain imaging to rule out acute pathology.  Takes Norco for pain, will continue while inpatient.  Also takes uloric and colchicine prn for gout.  However, on exam, joint not swollen or erythematous, ?ROM (unable to truly assess as patient jumped even with very light touch).  Also considered DVT especially in setting of recent increased immobilization but no calf pain, erythema, or swelling on exam; however, could consider Doppler in AM.  -Xray right knee, 4 view  -continue Norco 5-325 mg 1-2 tabs q6h prn pain  #History of atrial fibrillation- RRR on exam, EKG pending.  Seen by cardiologist Dr. Sallyanne Kuster in 05/2013 (see HPI for further information).  Pacemaker in place but has been in rate-controlled atrial fibrillation.  CHADS2 score of 3; not on Coumadin due to rectal bleeding.  -continue amiodorone 200 mg daily per cards recs -continue ASA 162mg  per cards recs  #Uncontrolled DM2- A1C 8.2% during last admission.  Patient takes Lantus 26 units daily and Novolog 8 units twice daily with meals as well as [place] at home. -holding  Lantus for now given no CBGs documented -SSI-moderate  #History of HTN- holding all antihypertensives (carvedilol, clonidine, lisinopril, Imdur) given current hypotension.   #OSA- Continue Bipap while inpatient.   #FEN- NS at 100 cc/hr for 12 hrs, reassess in AM  Electrolytes- Replete as needed.  Diet- low sodium, carb mod   #VTE PPX- lovenox   Dispo: Disposition is deferred at this time, awaiting improvement of current medical problems. Anticipated discharge in approximately 1-2 day(s).   The patient does have a current PCP Ryan Adie, MD)  and does need an [facility] hospital follow-up appointment after discharge.   Signed: Ivin Poot, MD 09/05/2013, 6:15 PM

## 2013-09-05 NOTE — H&P (Signed)
Date: 09/05/2013               Patient Name:  [name] MRN: [id number]  DOB: 10-May-1946 Age / Sex: 68 y.o., male   PCP: [name], MD              Medical Service: Internal Medicine Teaching Service              Attending Physician: Dr. Dominic Pea, DO    First Contact: Maryclare Bean, Med Student Pager: [phone]  Second Contact: Dr. Ivin Poot Pager: [phone]  Third Contact Dr. Karlyn Agee Pager: [phone]       After Hours (After 5p/  First Contact Pager: [phone]  weekends / holidays): Second Contact Pager: [phone]    Chief Complaint: Hypotension  History of Present Illness: [name] is a 68 y.o. morbidly obese male with PMH of uncontrolled DM2 with severe peripheral neuropathy, gout, hyperlipidemia, HTN, OSA, BPH, mild CAD treated medically, afib with pacemaker 2005 d/t SA node dysfunction, macular degeneration (legally blind), and herpes who [facility] 93/36. [facility] to [facility] he was given 2 L of fluid (unlcear what type) which caused his BP to increase to 140/85. After some period of time (amount of time unclear) he appeared lethargic with a BP of 90/40. The patient is a poor historian and could not elaborate further on the events leading to his admission. He denies any changes to his eating or drinking habits, N/V/D/C, or abdominal pain. The patient has also experienced severe right knee pain for the last 2 weeks which is tender to touch and has limited his mobility significantly due to inability to bear weight. His left knee is also painful but less severe, and his left forefoot was amputated following osteomyelitis on 06/24/13. He denies any calf pain, lower limb swelling, or joint pain elsewhere.   The patient was recently discharged from [facility] on 08/29/13 after being treated for sepsis (2/2 UTI or orchitis) with Vancomycin/Zosyn, Rocephin, and Cipro 250mg  BID. He was +flu and treated with Tamiflu 30mg  BID at that time. The  patient lives "with friends", has a Marine scientist aid who assists him, and has his daily medications assembled into "bubble packages" by [facility]. He is unsure about which medications he is currently taking but states he has been compliant with his daily "bubble packages" including his antimicrobials. He denies fever, night sweats, headache, dizziness, syncope/presyncope, SOB, URI symptoms, sick contacts, chest pain, or palpitations. He denies changes in urinary symptoms and notes that his genital area tenderness has resolved since his last admission.  Meds: Current Facility-Administered Medications  Medication Dose Route Frequency Provider Last Rate Last Dose  . 0.9 %  sodium chloride infusion   Intravenous Continuous Cresenciano Genre, MD      . acetaminophen (TYLENOL) tablet 650 mg  650 mg Oral Q4H PRN Cresenciano Genre, MD      . albuterol (PROVENTIL) (2.5 MG/3ML) 0.083% nebulizer solution 3 mL  3 mL Inhalation Q6H PRN Cresenciano Genre, MD      . amiodarone (PACERONE) tablet 200 mg  200 mg Oral Daily Cresenciano Genre, MD      . Derrill Memo ON 09/06/2013] aspirin chewable tablet 162 mg  162 mg Oral Daily Cresenciano Genre, MD      . cholecalciferol (VITAMIN D) tablet 1,000 Units  1,000 Units Oral QHS Cresenciano Genre, MD      . dorzolamide-timolol (COSOPT) 22.3-6.8 MG/ML ophthalmic solution 1 drop  1 drop Both Eyes BID Cresenciano Genre, MD      . enoxaparin (LOVENOX) injection 40 mg  40 mg Subcutaneous Q24H Cresenciano Genre, MD      . HYDROcodone-acetaminophen (NORCO/VICODIN) 5-325 MG per tablet 1-2 tablet  1-2 tablet Oral Q6H PRN Cresenciano Genre, MD      . Derrill Memo ON 09/06/2013] insulin aspart (novoLOG) injection 0-15 Units  0-15 Units Subcutaneous TID WC Cresenciano Genre, MD      . pantoprazole (PROTONIX) EC tablet 40 mg  40 mg Oral Daily Dominic Pea, DO      . senna-docusate (Senokot-S) tablet 2 tablet  2 tablet Oral Daily PRN Cresenciano Genre, MD         Allergies: Allergies as of 09/05/2013  . (No Known Allergies)     Past  Medical History: Past Medical History  Diagnosis Date  . Diabetes type 2, uncontrolled   . Gout   . Hyperlipidemia   . Hypertension   . Obstructive sleep apnea     On CPAP. Last sleep study (06/2009) - Severe obstructive sleep apnea/hypopnea syndrome, apnea-hypopnea index 70.5 per hour with non positional events moderately loud snoring and oxygen desaturation to a nadir of 85% on room air.  . Benign prostatic hypertrophy   . Morbid obesity   . Cardiomyopathy, hypertensive      diastolic dysfunction by [id number] echo  . Coronary artery disease      nonobstructive. 2 vessel. cardiac catheter in 12-03-03 showing 60% LAD occlusion, 30% circumflex occlusion.  . Pacemaker December 03, 2003     secondary to bradycardia  . Legally blind   . Herpes   . Occult blood positive stool      History of in August 2007.  colonoscopy in January 2008 showing normal colon with fair inadequate prep. By Dr. Silvano Rusk.  . Incarcerated ventral hernia     H/O. S/P repair ventral hernia repair 07/2005.  . Diabetic ulcer of thigh     H/O Rt thigh ulcer.  . Diabetic peripheral neuropathy   . Exertional shortness of breath     "comes and goes" (2013/07/18)  . GERD (gastroesophageal reflux disease)   . Degenerative joint disease      Right knee.  . Arthritis     "all over" (07/18/2013)  . Sepsis 08/26/2013  . Atrial fibrillation     not Candidate for Coumadin d/t rectal bleeding     Past Surgical History: Past Surgical History  Procedure Laterality Date  . Pacemaker lead revision  12/2003     likely microdislodgment  of right ventricular lead  . Ventral hernia repair  07/2005     S/P laparotomy, lysis of adhesions, repair of incarcerated ventral hernia with  partial omentectomy.. Performed by Dr. Marlou Starks.  . Hernia repair    . Cataract extraction, bilateral Bilateral   . Refractive surgery Bilateral   . Eye surgery    . 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 Insert /  replace / remove pacemaker  12-03-2003    Secondary to symptomatic bradycardia. DDDR pacemaker. By Dr. Rollene Fare.  . Insert / replace / remove pacemaker  2012/12/02    "old pacemaker died; had new one put in" (2013/07/18)  . Amputation Left 06/22/2013    Procedure: AMPUTATION FOOT;  Surgeon: Newt Minion, MD;  Location: [facility] [place];  Service: Orthopedics;  Laterality: Left;  Left Midfoot Amputation     Family History: Family History  Problem Relation  Age of Onset  . Coronary artery disease Sister   . Heart disease Sister   . Allergies Sister   . Asthma Brother   . Rheum arthritis Brother      Social History: History   Social History  . Marital Status: Single    Spouse Name: N/A    Number of Children: Y  . Years of Education: N/A   Occupational History  . disabled    Social History Main Topics  . Smoking status: Former Smoker -- 0.00 packs/day for 0 years    Types: Cigarettes    Quit date: 08/17/2003  . Smokeless tobacco: Never Used     Comment: started at age 81.  only smoked on occ- very rarely.0  . Alcohol Use: Yes     Comment: 06/23/2013 "drank some; never had a problem w/it"  . Drug Use: No  . Sexual Activity: No   Other Topics Concern  . Not on file   Social History Narrative  . No narrative on file    Review of Systems: See HPI. Otherwise negative ROS.  Physical Exam: Blood pressure 92/47, pulse 71, temperature 97.1 F (36.2 C), temperature source Oral, resp. rate 16, height 6' (1.829 m), weight 129.502 kg (285 lb 8 oz), SpO2 100.00%.  General: Alert, well-appearing, NAD. Appears stated age. HEENT: Eyes were closed for a majority of the interview. Nares wnl and without discharge or erythema. Pulmonary: CTAB, no incr WOB. No chest wall tenderness or deformity. Cardiovascular: Difficult to auscultate but appeared to be RRR with S1 and S2 normal. No murmur, rub, or gallop appreciated.   Abdomen: Bowel sounds active. Soft, NTND, no masses, no hepatosplenomegaly.   Skin:  Stasis dermatitis noted on inferior calf bilaterally.  Neurologic: Appropriately cooperative during exam.  MSK: Right knee painful/tender to palpation but not erythematous or warm. Remainder of right lower extremity nontender. Possible +Homan's sign on right (but patient unable to localize pain). Left lower extremity non-tender. No edema noted bilaterally.   Lab results: No results found.  Imaging results:  No results found.  Assessment & Plan by Problem: Active Problems:   Type 2 diabetes mellitus, uncontrolled   Atrial fibrillation   Hypotension  Mr. Mccathern is a 68 y.o. morbidly obese male with PMH of uncontrolled DM2 with severe peripheral neuropathy, gout, hyperlipidemia, HTN, OSA, BPH, mild CAD treated medically, pacemaker 2005 d/t SA node dysfunction, macular degeneration (legally blind), and herpes who [facility] with hypotension of 93/36 which increased to 140/85 after a 2L bolus of NS and then decreased back to 90/40.   Of note, he was recently discharged from [facility] on 08/29/13 after being treated for sepsis (2/2 UTI or orchitis) with Vancomycin/Zosyn, Rocephin, and Cipro 250mg  BID prior to discharge. He was +flu at that time and treated with Tamiflu 30mg  BID. He was also recently discharged from the hospital to [facility] on 06/24/13 after osteomyelitis of left forefoot with subsequent amputation.  # Hypotension: Pt has a history of HTN (clonidine, lasix 40mg  po BID, and lisinopril 20mg  po qhs - these were held due to hypotension), CAD, and pacemaker. BPs at [facility] include 93/36, 140/86 (s/p 2L), 90/40, and on admission BP of 92/47. No chest pain or SOB. DDx includes dehydration from poor PO intake, iatrogenic, SIRS, or autonomic instability. Most likely his hypotension is due to dehydration from either poor PO intake (he continued to have poor PO intake at last discharge) or medication-induced (currently on several antiHTN agents, and  his Metolazone 2.5mg  was  stopped at last discharge but patient is unsure if he has continued to take this). Due to his pacemaker he is very volume dependent and unable to amount a tachycardic response to improve his cardiac output, so dehydration from either poor PO intake or too much antihypertensive therapy could decrease his BP. SIRS is also a possibility but this is less likely given his asymptomatic history, afebrile and HR of 71 (although he is paced), and benign exam. However, SIRS is still a possibility given his recent hospitalization for sepsis, history of osteomyelitis, and significant knee pain on exam. - Admit to IMTS to med-surg bed with strict I/O's and pulse ox - Orthostats and EKG pending - Lactic acid, CBC with diff, BMP, LFTs, Mg, Phos pending - U/A with reflex microscopy pending  # Right knee pain: Patient has a hx of gout (treated with Uloric 40mg  po qhs, colchicine 0.6mg  po BID prn gout flares) and degenerative joint disease bilaterally. On exam right knee was not erythematous, warm, edematous; but was very tender to palpation. Patient reports inability to bear weight and limited mobility x 2 weeks. Possibly +Homans sign on exam but unclear whether pain was ilicited in the knee or calf and patient unable to differentiate. Wells' score of 4.5 if actual +Homans sign. Given history of DJD and morbid obesity this is much more likely but gout flare-up and DVT likelihood are low. - Knee x-ray pending - start Tylenol 650mg  po q4hrs prn pain - start Norco/Vicodin 5-325mg  q6hrs prn pain - PT/OT consults pending, appreciate recs - continue home dose Vitamin D 1000U po qhs  # Atrial fibrillation: RRR on auscultation - EKG pending - continue home dose Amiodarone 200mg  po daily (per pharmacy PO Amiodarone not worrisome for hypotension), ASA 162mg  po daily (per last cardiology note), and Isosorbide mononitrate 30mg  po daily  # Uncontrolled DMII- A1c 8.2% on last admission. Holding on home dose of Tegretol 200mg  po  BID due to risk of lightheadedness and syncope. Holding home dose of Byetta 47mcg injection BID. - continue home dose Lantus 26U (home dose prior to last admission was Lantus 40U qHS and Novolog 8U BID with meals) - start SSI tomorrow in AM - will monitor CBGs    # COPD: Stable - Lungs CTAB on exam. Pt denies pulmonary symptoms at this time.  - continue Albuterol neb 3 mL q6hrs prn wheezing  # CKD: Cr pending, baseline 1.9 - 2.2. No oliguria. - BMP pending - Strict I/O's  # Last admission +Trichomonas and GC/Chlamydia not performed  - GC/Chlamydia probe pending - U/A pending  # GERD - continue home dose Protonix 40mg  po daily   # Elevated IOP - continue home dose of Alphagan ophthalmic solution, Cosopt opthalmic solution, Artificial Tears BID  # Hyperlipidemia - holding Crestor 40 mg po qhs  # FEN/GI: - Fluids: NS @ 100 mL/hr - Electrolytes: BMP, Mg, Phos pending - Diet: Low sodium diet  DVT PPx: Lovenox 40mg  subQ daily and SCDs  Dispo: Disposition is deferred at this time; awaiting improvement of current medical problems.   The patient does have a current PCP (Sherian Maroon, MD) and does need an [facility] hospital follow-up appointment after discharge.   The patient does not have transportation limitations that hinder transportation to clinic appointments.  No Follow-up on file.  This is a Careers information officer Note.  The care of the patient was discussed with Dr. Stann Mainland and the assessment and plan was formulated with their assistance.  Please see their note for official documentation of the patient encounter.   Signed: Maryclare Bean, MS3 Pager [phone] 09/05/2013

## 2013-09-05 NOTE — Progress Notes (Signed)
Pt. Refuses CPAP at this time. Pt. Was made aware to let RT/RN know anytime during the night if he changed his mind & decided to wear CPAP.

## 2013-09-06 ENCOUNTER — Encounter (HOSPITAL_COMMUNITY): Payer: Self-pay | Admitting: General Practice

## 2013-09-06 DIAGNOSIS — M25569 Pain in unspecified knee: Secondary | ICD-10-CM

## 2013-09-06 DIAGNOSIS — K219 Gastro-esophageal reflux disease without esophagitis: Secondary | ICD-10-CM

## 2013-09-06 DIAGNOSIS — I4891 Unspecified atrial fibrillation: Secondary | ICD-10-CM

## 2013-09-06 DIAGNOSIS — N183 Chronic kidney disease, stage 3 unspecified: Secondary | ICD-10-CM

## 2013-09-06 DIAGNOSIS — G4733 Obstructive sleep apnea (adult) (pediatric): Secondary | ICD-10-CM

## 2013-09-06 DIAGNOSIS — I5030 Unspecified diastolic (congestive) heart failure: Secondary | ICD-10-CM

## 2013-09-06 DIAGNOSIS — E1165 Type 2 diabetes mellitus with hyperglycemia: Secondary | ICD-10-CM

## 2013-09-06 DIAGNOSIS — E46 Unspecified protein-calorie malnutrition: Secondary | ICD-10-CM

## 2013-09-06 DIAGNOSIS — I959 Hypotension, unspecified: Secondary | ICD-10-CM

## 2013-09-06 DIAGNOSIS — IMO0001 Reserved for inherently not codable concepts without codable children: Secondary | ICD-10-CM

## 2013-09-06 DIAGNOSIS — D649 Anemia, unspecified: Secondary | ICD-10-CM

## 2013-09-06 LAB — BASIC METABOLIC PANEL
BUN: 33 mg/dL — ABNORMAL HIGH (ref 6–23)
BUN: 29 mg/dL — ABNORMAL HIGH (ref 6–23)
CHLORIDE: 99 meq/L (ref 96–112)
CO2: 23 mEq/L (ref 19–32)
CO2: 21 mEq/L (ref 19–32)
CREATININE: 1.64 mg/dL — AB (ref 0.50–1.35)
Calcium: 8.7 mg/dL (ref 8.4–10.5)
Calcium: 9.3 mg/dL (ref 8.4–10.5)
Chloride: 97 mEq/L (ref 96–112)
Creatinine, Ser: 1.89 mg/dL — ABNORMAL HIGH (ref 0.50–1.35)
GFR calc Af Amer: 49 mL/min — ABNORMAL LOW (ref >90)
GFR calc non Af Amer: 36 mL/min — ABNORMAL LOW (ref >90)
GFR calc non Af Amer: 42 mL/min — ABNORMAL LOW (ref >90)
GFR, EST AFRICAN AMERICAN: 41 mL/min — AB (ref >90)
GLUCOSE: 240 mg/dL — AB (ref 70–99)
Glucose, Bld: 139 mg/dL — ABNORMAL HIGH (ref 70–99)
POTASSIUM: 4.4 meq/L (ref 3.7–5.3)
Potassium: 4.3 mEq/L (ref 3.7–5.3)
SODIUM: 134 meq/L — AB (ref 137–147)
Sodium: 135 mEq/L — ABNORMAL LOW (ref 137–147)

## 2013-09-06 LAB — CBC
HEMATOCRIT: 36.6 % — AB (ref 39.0–52.0)
Hemoglobin: 12 g/dL — ABNORMAL LOW (ref 13.0–17.0)
MCH: 26.8 pg (ref 26.0–34.0)
MCHC: 32.8 g/dL (ref 30.0–36.0)
MCV: 81.7 fL (ref 78.0–100.0)
Platelets: 220 10*3/uL (ref 150–400)
RBC: 4.48 MIL/uL (ref 4.22–5.81)
RDW: 14.9 % (ref 11.5–15.5)
WBC: 12.5 10*3/uL — ABNORMAL HIGH (ref 4.0–10.5)

## 2013-09-06 LAB — CREATININE, URINE, RANDOM: Creatinine, Urine: 142.99 mg/dL

## 2013-09-06 LAB — CORTISOL: Cortisol, Plasma: 17.9 ug/dL

## 2013-09-06 LAB — GLUCOSE, CAPILLARY
GLUCOSE-CAPILLARY: 188 mg/dL — AB (ref 70–99)
Glucose-Capillary: 139 mg/dL — ABNORMAL HIGH (ref 70–99)
Glucose-Capillary: 218 mg/dL — ABNORMAL HIGH (ref 70–99)
Glucose-Capillary: 188 mg/dL — ABNORMAL HIGH (ref 70–99)

## 2013-09-06 LAB — LACTIC ACID, PLASMA: Lactic Acid, Venous: 0.8 mmol/L (ref 0.5–2.2)

## 2013-09-06 LAB — SODIUM, URINE, RANDOM: Sodium, Ur: 33 mEq/L

## 2013-09-06 LAB — TROPONIN I

## 2013-09-06 MED ORDER — COLCHICINE 0.6 MG PO TABS
0.6000 mg | ORAL_TABLET | Freq: Two times a day (BID) | ORAL | Status: DC
  Administered 2013-09-06 – 2013-09-07 (×2): 0.6 mg via ORAL
  Filled 2013-09-06 (×3): qty 1

## 2013-09-06 MED ORDER — CLONIDINE HCL 0.2 MG PO TABS: 0.1000 mg | ORAL_TABLET | Freq: Two times a day (BID) | ORAL | Status: DC

## 2013-09-06 MED ORDER — FEBUXOSTAT 40 MG PO TABS
40.0000 mg | ORAL_TABLET | Freq: Every day | ORAL | Status: DC
  Administered 2013-09-06: 40 mg via ORAL
  Filled 2013-09-06 (×2): qty 1

## 2013-09-06 MED ORDER — CARVEDILOL 25 MG PO TABS: 25.0000 mg | ORAL_TABLET | Freq: Two times a day (BID) | ORAL | Status: DC

## 2013-09-06 NOTE — Progress Notes (Signed)
Visit to patient while in hospital. Will call after patient is discharged to assist with transition of care. 

## 2013-09-06 NOTE — Progress Notes (Signed)
Patient bladder scanned  And only  60 cc noted.

## 2013-09-06 NOTE — Progress Notes (Addendum)
Subjective:  Pt seen and examined in AM. No acute events overnight. Pt reports no complaints other than right knee pain. Pt reports mild swelling and warmth for the past few days and being unable to ambulate. He denies lightheadedness, chest pain, dyspnea, or syncope.    Objective: Vital signs in last 24 hours: Filed Vitals:   09/06/13 0617 09/06/13 0900 09/06/13 1039 09/06/13 1050  BP: 159/96 134/80 180/91 190/89  Pulse: 73     Temp: 97.9 F (36.6 C) 98.4 F (36.9 C)    TempSrc: Oral Oral    Resp: 18 19    Height:      Weight:      SpO2: 95% 98%     Weight change:   Intake/Output Summary (Last 24 hours) at 09/06/13 1208 Last data filed at 09/06/13 0900  Gross per 24 hour  Intake    120 ml  Output   1025 ml  Net   -905 ml    General: alert, cooperative, NAD HEENT: NCAT, vision decreased at baseline (legally blind), mmm  Neck: supple, no lymphadenopathy Lungs: clear to ascultation bilaterally, normal work of respiration, no wheezes, rales, ronchi Heart: regular rate and rhythm, no murmurs, gallops, or rubs Abdomen: soft, non-tender, non-distended, normal bowel sounds  Extremities: right knee with mild edema or erythema, no calf pain, s/p left transmetatarsel amputation (clean, dry, intact without erythema or drainage), no edema  Neurologic: alert & oriented X3, cranial nerves II-XII intact, strength grossly intact, sensation intact to light touch    Lab Results: Basic Metabolic Panel:  Recent Labs Lab 09/05/13 1944 09/06/13 0358  NA 137 135*  K 4.4 4.4  CL 98 99  CO2 22 23  GLUCOSE 214* 139*  BUN 34* 33*  CREATININE 2.23* 1.89*  CALCIUM 8.8 8.7  MG 2.2  --   PHOS 3.9  --    Liver Function Tests:  Recent Labs Lab 09/05/13 1944  AST 63*  ALT 67*  ALKPHOS 125*  BILITOT 0.5  PROT 7.2  ALBUMIN 2.4*   No results found for this basename: LIPASE, AMYLASE,  in the last 168 hours No results found for this basename: AMMONIA,  in the last 168  hours CBC:  Recent Labs Lab 09/05/13 1944 09/06/13 1058  WBC 14.0* 12.5*  NEUTROABS 10.0*  --   HGB 10.5* 12.0*  HCT 33.0* 36.6*  MCV 83.3 81.7  PLT 197 220   Cardiac Enzymes:  Recent Labs Lab 09/05/13 2315  TROPONINI <0.30   BNP: No results found for this basename: PROBNP,  in the last 168 hours D-Dimer: No results found for this basename: DDIMER,  in the last 168 hours CBG:  Recent Labs Lab 09/05/13 2138 09/06/13 0801 09/06/13 1159  GLUCAP 167* 139* 218*   Hemoglobin A1C: No results found for this basename: HGBA1C,  in the last 168 hours Fasting Lipid Panel: No results found for this basename: CHOL, HDL, LDLCALC, TRIG, CHOLHDL, LDLDIRECT,  in the last 168 hours Thyroid Function Tests: No results found for this basename: TSH, T4TOTAL, FREET4, T3FREE, THYROIDAB,  in the last 168 hours Coagulation:  Recent Labs Lab 09/05/13 2315  LABPROT 15.9*  INR 1.30   Anemia Panel: No results found for this basename: VITAMINB12, FOLATE, FERRITIN, TIBC, IRON, RETICCTPCT,  in the last 168 hours Urine Drug Screen: Drugs of Abuse     Component Value Date/Time   LABOPIA NEG 12/16/2008 2246   COCAINSCRNUR NEG 12/16/2008 2246   LABBENZ NEG 12/16/2008 2246   AMPHETMU  NEG 12/16/2008 2246    Alcohol Level: No results found for this basename: ETH,  in the last 168 hours Urinalysis:  Recent Labs Lab 09/05/13 2045  [place] 1.017  PHURINE 5.0  [place] 0.2  NITRITE NEGATIVE  LEUKOCYTESUR TRACE*    Micro Results: Recent Results (from the past 240 hour(s))  URINE CULTURE     Status: None   Collection Time    08/27/13  4:44 PM      Result Value Range Status   Specimen Description URINE, CATHETERIZED   Final   Special Requests NONE   Final   Culture  Setup Time     Final   Value: 08/28/2013 02:24     Performed at [facility]      Final   Value: NO GROWTH     Performed at [facility]   Culture     Final   Value: NO GROWTH     Performed at [facility]   Report Status 08/29/2013 FINAL   Final  CULTURE, BLOOD (ROUTINE X 2)     Status: None   Collection Time    08/27/13  5:15 PM      Result Value Range Status   Specimen Description BLOOD ARM LEFT   Final   Special Requests BOTTLES DRAWN AEROBIC AND ANAEROBIC 10CC   Final   Culture  Setup Time     Final   Value: 08/27/2013 21:57     Performed at [facility]   Culture     Final   Value: NO GROWTH 5 DAYS     Performed at [facility]   Report Status 09/02/2013 FINAL   Final  CULTURE, BLOOD (ROUTINE X 2)     Status: None   Collection Time    08/27/13  5:25 PM      Result Value Range Status   Specimen Description BLOOD HAND LEFT   Final   Special Requests     Final   Value: BOTTLES DRAWN AEROBIC AND ANAEROBIC 10CCBLUE 5CCRED   Culture  Setup Time     Final   Value: 08/27/2013 21:57     Performed at [facility]   Culture     Final   Value: NO GROWTH 5 DAYS     Performed at [facility]   Report Status 09/02/2013 FINAL   Final   Studies/Results: US Scrotum  09/06/2013   CLINICAL DATA:  Right scrotal tenderness. Evaluate for torsion or epididymitis.  EXAM: SCROTAL ULTRASOUND  DOPPLER ULTRASOUND OF THE TESTICLES  TECHNIQUE: Complete ultrasound examination of the testicles, epididymis, and other scrotal structures was performed. Color and spectral Doppler ultrasound were also utilized to evaluate blood flow to the testicles.  COMPARISON:  None.  FINDINGS: Right testicle  Measurements: 3.6 x 1.8 x 2.5 cm. No mass or microlithiasis visualized.  Left testicle  Measurements: 3.1 x 2.0 x 2.5 cm. No mass or microlithiasis visualized.  Right epididymis:  Normal in size and appearance.  Left epididymis:  A 0.8 cm cyst is noted at the epididymal head.  Hydrocele:  None visualized.  Varicocele:  None visualized.  Pulsed  Doppler interrogation of both testes demonstrates low resistance arterial and venous waveforms bilaterally.  IMPRESSION: 1. No evidence of testicular torsion; no evidence of epididymitis. 2. Small left epididymal head cyst noted.   Electronically Signed  By: Garald Balding M.D.   On: 09/06/2013 00:24   [place] Art/ven Flow Abd Pelv Doppler  09/06/2013   CLINICAL DATA:  Right scrotal tenderness. Evaluate for torsion or epididymitis.  EXAM: SCROTAL ULTRASOUND  DOPPLER ULTRASOUND OF THE TESTICLES  TECHNIQUE: Complete ultrasound examination of the testicles, epididymis, and other scrotal structures was performed. Color and spectral Doppler ultrasound were also utilized to evaluate blood flow to the testicles.  COMPARISON:  None.  FINDINGS: Right testicle  Measurements: 3.6 x 1.8 x 2.5 cm. No mass or microlithiasis visualized.  Left testicle  Measurements: 3.1 x 2.0 x 2.5 cm. No mass or microlithiasis visualized.  Right epididymis:  Normal in size and appearance.  Left epididymis:  A 0.8 cm cyst is noted at the epididymal head.  Hydrocele:  None visualized.  Varicocele:  None visualized.  Pulsed Doppler interrogation of both testes demonstrates low resistance arterial and venous waveforms bilaterally.  IMPRESSION: 1. No evidence of testicular torsion; no evidence of epididymitis. 2. Small left epididymal head cyst noted.   Electronically Signed   By: Garald Balding M.D.   On: 09/06/2013 00:24   Dg Chest Portable 1 View  09/05/2013   CLINICAL DATA:  Sepsis.  EXAM: PORTABLE CHEST - 1 VIEW  COMPARISON:  Chest radiograph from 08/29/2013  FINDINGS: The lungs are well-aerated. Pulmonary vascularity is at the upper limits of normal. There is no evidence of focal opacification, pleural effusion or pneumothorax.  The cardiomediastinal silhouette is borderline enlarged. A pacemaker is seen overlying the right chest wall, with leads ending overlying the right atrium and right ventricle. No acute osseous abnormalities are seen.   IMPRESSION: Borderline cardiomegaly; lungs remain grossly clear.   Electronically Signed   By: Garald Balding M.D.   On: 09/05/2013 23:16   Dg Knee Complete 4 Views Right  09/05/2013   CLINICAL DATA:  Right knee pain.  EXAM: RIGHT KNEE - COMPLETE 4+ VIEW  COMPARISON:  DG KNEE 2 VIEWS*R* dated 01/31/2008  FINDINGS: There is no evidence of fracture or dislocation. The joint spaces are preserved. Marginal osteophytes are seen at the lateral compartment; the patellofemoral joint is grossly unremarkable in appearance. Calcification along the medial aspect of the medial femoral condyle is somewhat higher than expected for a Pellegrini-Stieda lesion, and may reflect calcification within the distal musculature.  A small knee joint effusion is suspected. Scattered vascular calcifications are seen.  IMPRESSION: 1. No evidence of fracture or dislocation. 2. Suspect small knee joint effusion. 3. Calcification along the medial aspect of the medial femoral condyle may reflect prior injury to overlying musculature.   Electronically Signed   By: Garald Balding M.D.   On: 09/05/2013 21:25   Medications: I have reviewed the patient's current medications.  Scheduled Meds: . amiodarone  200 mg Oral Daily  . aspirin  162 mg Oral Daily  . atorvastatin  80 mg Oral q1800  . brimonidine  1 drop Both Eyes TID  . carbamazepine  200 mg Oral BID  . cholecalciferol  1,000 Units Oral QHS  . dorzolamide-timolol  1 drop Both Eyes BID  . heparin subcutaneous  5,000 Units Subcutaneous Q8H  . insulin aspart  0-15 Units Subcutaneous TID WC  . pantoprazole  40 mg Oral Daily  . 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  Plan:   Hypotension  - resolved. Most likely due to hypovolemia vs autonomic dysfunction in setting of uncontrolled DM -Hold home clonidine, decrease clonidine 0.2 mg BID to 0.1mg  BID on discharge -Hold home lisinopril 20 mg daily  -Hold home furosemide 40 mg daily  -Hold home carvedilol 25 mg BID, consider restarting in AM if hypertensive -Blood cultures x 2 --> NGTD -UA without evidence of UTI, cultures with no growth -Cortisol level --> wnl   Right Knee Pain  - Most likely due to acute gouty flare considering leukocytosis, swelling, pain, and warmth. Uric acid 5.5. Joint aspiration not performed due to minimal fluid.  -Xray left knee ---> small knee joint effusion, no evidence of fracture or dislocation -Start colchicine 0.6 mg BID until flare  -Continue febuxostat 40 mg daily -Nocro/vicodin 5-325 mg 1-2mg  q6hr for pain -Monitor leukocytosis --> resolving  -Obtain PT/OT consults ---> [facility] PT[facility]OT   Normocytic Anemia - Pt with Hg of 10.5 on admission,now 12.0 with no evidence of active bleeding. Etiology most likely due to CKD.  -Continue to monitor for bleeding   Atrial Fibrillation - currently in sinus rhythm with normal HR. Pacemaker in place but has been in rate-controlled atrial fibrillation. CHADS2 score of 3; not on Coumadin due to rectal bleeding.  -Continue daily aspirin 81 mg daily  -Continue home amidarone 200 mg daily   Insulin-dependent Type II DM - Last A1c 8.2. Pt on Lantus 26 U daily and Novolog 8 U BID with meals and [place] -Insulin sliding scale -Hold home Lantus, consider restarting if uncontrollled   Chronic Kidney Disease Stage 3 -  Cr of 1.64 below baseline 2.0.  -Continue to monitor renal function  Grade 1 Diastolic Heart Failure - on 01/01/13 2D-echo with EF 50-55% -Continue to  monitor volume status -Daily weight and I & O's  OSA - Pt on CPAP at home -CPAP at bedtime as tolerated  GERD - Currently without reflux symptoms  -Continue pantoprazole 40 mg daily    Diet:  Low Sodium  DVT PPx: SQ Heparin TID  Code: Full    Dispo: Disposition is deferred at this time, awaiting improvement of current medical problems.  Anticipated discharge in approximately 1 day.   The patient does have a current PCP [name], MD) and does need an [facility] hospital follow-up appointment after discharge.  The patient does have transportation limitations that hinder transportation to clinic appointments.  .Services Needed at time of discharge: Y = Yes, Blank = No PT:   OT:   RN:   Equipment:   Other:     LOS: 1 day   Juluis Mire, MD 09/06/2013, 12:08 PM

## 2013-09-06 NOTE — H&P (Signed)
  Date: 09/06/2013  Patient name: [name]  Medical record number: [id number]  Date of birth: June 29, 1946   I have seen and evaluated [name] and discussed their care with the Residency Team.  Briefly, Mr. Zanin is a 68yo gentleman with PMH of DM2, peripheral neuropathy, HTN. HLD, CAD, Afib with PCM, DJD of the knee, gout who presents with hypotension.  He was recently successfully treated for a UTI and orchitis along with influenza.  He was noted to be hypotensive when checked on at [facility].  His BP did improve with fluids in the ED.  His other symptoms included rt knee pain X 2 weeks with some mild swelling.  No labs were found at the time of admission, however, he was noted to have an elevated WBC (14) and low Hgb of 10.    Assessment and Plan: I have seen and evaluated the patient as outlined above. I agree with the formulated Assessment and Plan as detailed in the residents' admission note, with the following changes:   1. Hypotension: He does not appear to be acutely infected at this time.  He has a relatively normal UA, normal CXR and is asymptomatic.  He denies any fevers at home, dizziness, lightheadedness, chills, change in urinary habits or any return of his flu like symptoms.  There is some questions for decreased PO as well.  He did have a bump in his BUN/Cr making dehydration a possibility.  These responded nicely to fluids.  Possibilities include acute infection, undertreated UTI/orchitis, decreased PO intake and immobility due to knee pain, medication effect.  Our team will plan to further investigate the increased WBC and I believe cultures have already been sent.  Will culture for any fever.  Hold fluids at this time as BP has improved.  Decrease clonidine to 0.1mg  BID and give hold parameters for other medications. Consider decreasing lisinopril to once daily.   2. Acute gouty attack, knee: Knee xray was ordered.  Patient reports being on colchicine for gout  in the past.  + swelling and very mild warmth with increased tenderness to right knee.  Will treat for acute attack.  Neutrophilic leukocytosis could be explained by this attack.    Other issues are chronic and discussed in Dr. Dennie Maizes note.   Sid Falcon, MD 1/22/201512:50 PM

## 2013-09-06 NOTE — Evaluation (Signed)
Physical Therapy Evaluation Patient Details Name: [name] MRN: [id number] DOB: 08/09/1946 Today's Date: 09/06/2013 Time: [id number] PT Time Calculation (min): 34 min  PT Assessment / Plan / Recommendation History of Present Illness  Patient is a 68 y/o male admitted with hypotension.  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  Clinical Impression  Patient presents with decreased independence with mobility due to deficits listed below.  He will benefit from skilled PT in the acute setting to allow return home with assist of aide and family with HHPT.    PT Assessment  Patient needs continued PT services    Follow Up Recommendations  Outpatient PT (already set up with therapy at [facility])    Does the patient have the potential to tolerate intense rehabilitation    N/A  Barriers to Discharge  None      Equipment Recommendations  None recommended by PT    Recommendations for Other Services   None  Frequency Min 3X/week    Precautions / Restrictions Precautions Precautions: Fall Precaution Comments: reports fell between w/c and walker trying to stand prior to admission   Pertinent Vitals/Pain Mod c/o pain right knee with mobility, would not rate      Mobility  Bed Mobility Overal bed mobility: Needs Assistance Bed Mobility: Supine to Sit Supine to sit: HOB elevated;Min assist General bed mobility comments: increased time and effortful Transfers Overall transfer level: Needs assistance Equipment used: Rolling walker (2 wheeled) Transfers: Sit to/from [facility] Sit to Stand: Mod assist Stand pivot transfers: Min assist;Mod assist General transfer comment: increased  time for pt to attempt and then needed couple of tries to come upright with painful right knee; cues for stepping around to chair from bed with walker and increased time/effort and shaky throughout    Exercises General Exercises - Lower Extremity Ankle Circles/Pumps: AROM;5 reps;Both;Seated Long Arc Quad: AROM;Both;5 reps;Seated   PT Diagnosis: Difficulty walking;Acute pain  PT Problem List: Decreased strength;Pain;Decreased balance;Decreased mobility;Decreased activity tolerance PT Treatment Interventions: DME instruction;Balance training;Gait training;Functional mobility training;Patient/family education;Therapeutic activities;Therapeutic exercise     PT Goals(Current goals can be found in the care plan section) Acute Rehab PT Goals Patient Stated Goal: To go home Time For Goal Achievement: 09/20/13 Potential to Achieve Goals: Good  Visit Information  Last PT Received On: 09/06/13 Assistance Needed: +2 History of Present Illness: Patient is a 68 y/o male admitted with hypotension.  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       Prior [place] expects to be discharged to:: Private residence Living Arrangements: Non-relatives/Friends Available Help at Discharge: Friend(s);Personal care attendant Type of Home: House Home Access: [place]: One level Home Equipment: [place] - 2 wheels;Wheelchair - [employer];Shower seat (camwalker for left foot) Additional Comments: aide in morning and evenings few hours each 7 days a week, goes to [facility] 3 days a [facility] Prior Function Level of Independence: Needs assistance Gait / Transfers Assistance  Needed: usually able to get around with walker  in the house ADL's / Homemaking Assistance Needed: aide helps with cooking and cleaning Comments: Pt is blind.  He lost complete vision approximately7-8 months ago. Communication Communication: No difficulties Dominant Hand: Right    Cognition  Cognition Arousal/Alertness: Awake/alert Behavior During Therapy: WFL for tasks assessed/performed Overall Cognitive Status: Within Functional Limits for tasks assessed    Extremity/Trunk Assessment Lower Extremity Assessment Lower Extremity Assessment: RLE deficits/detail;LLE deficits/detail RLE Deficits / Details: right knee pain with AROM limited about 20 degrees extension and 70-80 degrees flexion; ankle DF limited to about -20 LLE Deficits / Details: AROM WFL with midfoot amputation well healed   Balance Balance Overall balance assessment: Needs assistance Sitting-balance support: Feet supported Sitting balance-Leahy Scale: Fair Sitting balance - Comments: leans posterior when lifting legs for testing/exercise Standing balance-Leahy Scale: Poor Standing balance comment: heavy reliance on UE assist with walker  End of Session PT - End of Session Equipment Utilized During Treatment: Gait belt Activity Tolerance: Patient limited by fatigue;Patient limited by pain Patient left: in chair;with call bell/phone within reach Nurse Communication: Mobility status  GP     [facility] 09/06/2013, 11:48 AM [name], [place] 09/06/2013

## 2013-09-06 NOTE — Progress Notes (Signed)
09/06/13 1539 nsg Dr. Naaman Plummer was notified by [facility] RN that pt not able to go home today and MD agreed to d/c pt in am.

## 2013-09-06 NOTE — Evaluation (Signed)
Occupational Therapy Evaluation Patient Details Name: Isahi Godwin MRN: [id number] DOB: 1946/06/23 Today's Date: 09/06/2013 Time: 1500-1530 OT Time Calculation (min): 30 min  OT Assessment / Plan / Recommendation History of present illness Patient is a 68 y/o male admitted with hypotension.  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   Clinical Impression   Pt presents to acute OT with conditions listed above. Pt is currently at mod-max assist x2 for transfers and requires assist for all ADLs. Prior to admission, pt was receiving assist at home. Pt does not need further acute skilled OT services, but recommend OT services through [facility]. Acute OT to sign off.    OT Assessment  All further OT needs can be met in the next venue of care    Follow Up Recommendations   ([facility] OT)     Equipment Recommendations  None recommended by OT          Precautions / Restrictions Precautions Precautions: Fall Restrictions Weight Bearing Restrictions: No   Pertinent Vitals/Pain Pt demonstrated pain in R knee.    ADL  Lower Body Dressing: Maximal assistance (donned CAM walker - pt able to lift leg to assist) Where Assessed - Lower Body Dressing: Supported sitting Toilet Transfer: +2 Total assistance Toilet Transfer: Patient Percentage: 60% Toilet Transfer Method: Sit to stand;Stand pivot Toilet Transfer Equipment: Regular height toilet (rolling walker) Toileting - Clothing Manipulation and Hygiene:  (Total assist for urinal) Equipment Used: Rolling walker Transfers/Ambulation Related to ADLs: Pt required extra time and max encouragement for sit>stand and stand pivot ADL Comments: Pt requires assist in all  ADLs. During attempted stand pivot transfer, pt declined used of lift and continued performing transfer to edge of bed.    OT Diagnosis: Generalized weakness  OT Problem List: Decreased strength;Decreased activity tolerance;Decreased coordination;Pain  OT Goals(Current goals can be found in the care plan section) Acute Rehab OT Goals Patient Stated Goal: To go home OT Goal Formulation: With patient  Visit Information  Last OT Received On: 09/06/13 Assistance Needed: +2 History of Present Illness: Patient is a 68 y/o male admitted with hypotension.  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       Prior [place] expects to be discharged to:: Private residence Living Arrangements: Non-relatives/Friends Available Help at Discharge: Friend(s);Personal care attendant Type of Home: House Home Access: [place]: One level Home Equipment: [place] - 2 wheels;Wheelchair - [employer];Shower seat;Walker - 4 wheels Additional Comments: aide in morning and evenings few hours each 7 days a week, goes to [facility] 3 days a week (Per [facility] coordinator, [facility] visits will be increased to 5 per week) Prior Function Level of Independence: Needs assistance Gait / Transfers Assistance Needed: Per pt, he has assist using wheelchair and walker to get around the house ADL's / Homemaking Assistance Needed: Aide assists with BADLs/IADLs Comments: Pt is blind.  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 Communication Communication: No difficulties Dominant Hand: Right  Vision/Perception Vision - History Baseline Vision: Legally blind (Cannot tell  light from dark)   Cognition  Cognition Arousal/Alertness: Awake/alert Behavior During Therapy: WFL for tasks assessed/performed Overall Cognitive Status: Within Functional Limits for tasks assessed    Extremity/Trunk Assessment Upper Extremity Assessment Upper Extremity Assessment: Generalized weakness Lower Extremity Assessment Lower Extremity Assessment: Defer to PT evaluation     Mobility Bed Mobility Overal bed mobility: Needs Assistance Bed Mobility: Sit to Supine Supine to sit: Min guard Sit to supine: Min guard General bed mobility comments: Pt min guard for sit to lying down, but required max verbal cues for proper positioning in bed. Transfers Overall transfer level: Needs assistance Equipment used: Rolling walker (2 wheeled) Transfers: Sit to/from [facility] Sit to Stand: +2 physical assistance;Max assist Stand pivot transfers: Mod assist      Balance Balance Overall balance assessment: Needs assistance Sitting-balance support: Feet supported Sitting balance-Leahy Scale: Fair Standing balance support: Bilateral upper extremity supported Standing balance-Leahy Scale: Poor   End of Session OT - End of Session Equipment Utilized During Treatment: Gait belt;Rolling walker;Other (comment) (CAM boot) Activity Tolerance: Patient tolerated treatment well Patient left: in bed;with family/visitor present ([facility] coordinator in room)      Guerry Bruin [phone] 09/06/2013, 4:12 PM

## 2013-09-07 DIAGNOSIS — N183 Chronic kidney disease, stage 3 (moderate): Secondary | ICD-10-CM

## 2013-09-07 DIAGNOSIS — N1832 Chronic kidney disease, stage 3b: Secondary | ICD-10-CM | POA: Diagnosis present

## 2013-09-07 DIAGNOSIS — M25561 Pain in right knee: Secondary | ICD-10-CM | POA: Diagnosis present

## 2013-09-07 LAB — URINE CULTURE
Colony Count: NO GROWTH
Culture: NO GROWTH

## 2013-09-07 LAB — GLUCOSE, CAPILLARY
GLUCOSE-CAPILLARY: 176 mg/dL — AB (ref 70–99)
Glucose-Capillary: 160 mg/dL — ABNORMAL HIGH (ref 70–99)
Glucose-Capillary: 202 mg/dL — ABNORMAL HIGH (ref 70–99)

## 2013-09-07 MED ORDER — INSULIN GLARGINE 100 UNIT/ML ~~LOC~~ SOLN: 8.0000 [IU] | Freq: Every day | SUBCUTANEOUS | Status: DC

## 2013-09-07 MED ORDER — ISOSORBIDE MONONITRATE ER 30 MG PO TB24
30.0000 mg | ORAL_TABLET | Freq: Every day | ORAL | Status: DC
  Administered 2013-09-07: 30 mg via ORAL
  Filled 2013-09-07: qty 1

## 2013-09-07 MED ORDER — CLONIDINE HCL 0.2 MG PO TABS: 0.1000 mg | ORAL_TABLET | Freq: Two times a day (BID) | ORAL | Status: DC

## 2013-09-07 MED ORDER — CARVEDILOL 25 MG PO TABS
25.0000 mg | ORAL_TABLET | Freq: Two times a day (BID) | ORAL | Status: DC
  Administered 2013-09-07: 25 mg via ORAL
  Filled 2013-09-07 (×3): qty 1

## 2013-09-07 NOTE — Discharge Instructions (Signed)
-  Please use 0. 6 mg colchicine twice a day until acute flare resolves, if you have worsening knee pain, redness, swelling, warmth, or fever please talk to your primary doctor or if severe go to ED.  -Take clonidine 0.1 mg daily instead of 0.2 BID, [name] will adjust as needed -Continue taking coreg and imdur as previously prescribed -Stop taking lisinopril and lasix , please discuss with [name] about when and if to restart

## 2013-09-07 NOTE — Progress Notes (Signed)
Internal Medicine Attending  Date: 09/07/2013  Patient name: [name] Medical record number: [id number] Date of birth: Nov 23, 1945 Age: 68 y.o. Gender: male  I saw and evaluated the patient, and discussed his care with housestaff.  I reviewed the resident's note by Dr. Naaman Plummer and I agree with the resident's findings and plans as documented in her note.

## 2013-09-07 NOTE — Discharge Summary (Signed)
Name: [name] MRN: [id number] DOB: [birth year] 68 y.o. PCP: [name], MD  Date of Admission: 09/05/2013  5:00 PM Date of Discharge: 09/07/2013 Attending Physician: Dr. Marinda Elk Discharge Diagnosis: Principal Problem:   Hypotension Active Problems:   Type 2 diabetes mellitus, uncontrolled   Atrial fibrillation   Unspecified protein-calorie malnutrition   Right knee pain   CKD (chronic kidney disease) stage 3, GFR 30-59 ml/min  Discharge Medications:   Medication List    STOP taking these medications       ciprofloxacin 250 MG tablet  Commonly known as:  CIPRO     exenatide 10 MCG/0.04ML Sopn injection  Commonly known as:  BYETTA     furosemide 40 MG tablet  Commonly known as:  LASIX     insulin aspart 100 UNIT/ML injection  Commonly known as:  novoLOG     lisinopril 20 MG tablet  Commonly known as:  PRINIVIL,ZESTRIL      TAKE these medications       albuterol 108 (90 BASE) MCG/ACT inhaler  Commonly known as:  PROVENTIL HFA;VENTOLIN HFA  Inhale 2 puffs into the lungs every 6 (six) hours as needed for wheezing.     amiodarone 200 MG tablet  Commonly known as:  PACERONE  Take 200 mg by mouth daily.     aspirin 81 MG chewable tablet  Chew 81 mg by mouth daily.     brimonidine 0.1 % Soln  Commonly known as:  ALPHAGAN P  Place 1 drop into both eyes every 8 (eight) hours.     carbamazepine 200 MG tablet  Commonly known as:  TEGRETOL  Take 200 mg by mouth 2 (two) times daily.     carvedilol 25 MG tablet  Commonly known as:  COREG  Take 25 mg by mouth 2 (two) times daily with a meal.     cholecalciferol 1000 UNITS tablet  Commonly known as:  VITAMIN D  Take 1,000 Units by mouth at bedtime.     cloNIDine 0.2 MG tablet  Commonly known as:  CATAPRES  Take 0.5 tablets (0.1 mg total) by mouth 2 (two) times daily.     colchicine 0.6 MG tablet  Take 0.6 mg by mouth 2 (two) times daily as needed (for gout flare-ups).     dorzolamide-timolol  22.3-6.8 MG/ML ophthalmic solution  Commonly known as:  COSOPT  Place 1 drop into both eyes 2 (two) times daily.     febuxostat 40 MG tablet  Commonly known as:  ULORIC  Take 40 mg by mouth at bedtime.     glucose blood test strip  1 each by Other route as needed. Use as instructed to check blood sugar before meals and at bedtime at least 2-3 time daily     HYDROcodone-acetaminophen 5-325 MG per tablet  Commonly known as:  NORCO  Take 1-2 tablets by mouth every 6 (six) hours as needed.     insulin glargine 100 UNIT/ML injection  Commonly known as:  LANTUS  Inject 0.08 mLs (8 Units total) into the skin at bedtime.     Insulin Pen Needle 31G X 8 MM Misc  Use to inject insulin 2x daily before breakfast and dinner.     isosorbide mononitrate 30 MG 24 hr tablet  Commonly known as:  IMDUR  Take 30 mg by mouth daily.     magnesium oxide 400 MG tablet  Commonly known as:  MAG-OX  Take 400 mg by mouth daily.     methylcellulose  1 % ophthalmic solution  Commonly known as:  ARTIFICIAL TEARS  Place 1 drop into both eyes 2 (two) times daily.     omeprazole 20 MG tablet  Commonly known as:  PRILOSEC OTC  Take 20 mg by mouth every evening.     polyethylene glycol packet  Commonly known as:  MIRALAX / GLYCOLAX  Take 17 g by mouth daily.     potassium chloride SA 20 MEQ tablet  Commonly known as:  K-DUR,KLOR-CON  Take 20 mEq by mouth 2 (two) times daily.     rosuvastatin 40 MG tablet  Commonly known as:  CRESTOR  Take 40 mg by mouth every evening.     sennosides-docusate sodium 8.6-50 MG tablet  Commonly known as:  SENOKOT-S  Take 2 tablets by mouth daily.       Disposition and follow-up:   [name] was discharged from [facility] in stable condition.  At the hospital follow up visit please address:  BP: admitted for hypotension. Clonidine dose decreased to 0.1mg , restarted on coreg and IMDUR, and holding lasix and lisinopril.   R knee  pain--?gout flare. Resumed colchine and uloric. If signs of infection, febrile, erythema, edematous, or pain, consider possible infection and may need further work up and possible aspiration.   CKD 3--Cr 2.8 on admission, down to 1.64 on discharge. Holding ACE and lasix.   DM2--on SSI only during admission. Discharged on lantus 8 units and holding novolog and byetta.  Recommend following up cbg's and adjusting as needed.    2.  Labs / imaging needed at time of follow-up: bmet: assess renal function  3.  Pending labs/ test needing follow-up: blood cx x2 09/05/13  Follow-up Appointments:     Follow-up Information   Follow up with Sherian Maroon, MD In 1 day.   Specialty:  Family Medicine   Contact information:   [id number] [place] [address] [id number]      Discharge Instructions: Discharge Orders   Future Appointments Provider Department Dept Phone   12/20/2013 9:30 AM Kathee Delton, MD [facility] [phone]   Future Orders Complete By Expires   Diet - low sodium heart healthy  As directed    Diet - low sodium heart healthy  As directed    Increase activity slowly  As directed      Procedures Performed:  Dg Chest 2 View  08/29/2013   CLINICAL DATA:  Shortness of breath.  Cough.  EXAM: CHEST  2 VIEW  COMPARISON:  08/27/2013 .  FINDINGS: Cardiomegaly, normal pulmonary vascularity. Cardiac pacer with lead tips in the right atrium and right ventricle. No focal infiltrate. No pleural effusion or pneumothorax. Diffuse degenerative changes thoracic spine.  IMPRESSION: Cardiomegaly. No CHF. Cardiac pacer in good anatomic position . No acute cardiopulmonary disease.   Electronically Signed   By: Marcello Moores  Register   On: 08/29/2013 09:44   US Scrotum  09/06/2013   CLINICAL DATA:  Right scrotal tenderness. Evaluate for torsion or epididymitis.  EXAM: SCROTAL ULTRASOUND  DOPPLER ULTRASOUND OF THE TESTICLES  TECHNIQUE: Complete ultrasound examination of the  testicles, epididymis, and other scrotal structures was performed. Color and spectral Doppler ultrasound were also utilized to evaluate blood flow to the testicles.  COMPARISON:  None.  FINDINGS: Right testicle  Measurements: 3.6 x 1.8 x 2.5 cm. No mass or microlithiasis visualized.  Left testicle  Measurements: 3.1 x 2.0 x 2.5 cm. No mass or microlithiasis visualized.  Right epididymis:  Normal  in size and appearance.  Left epididymis:  A 0.8 cm cyst is noted at the epididymal head.  Hydrocele:  None visualized.  Varicocele:  None visualized.  Pulsed Doppler interrogation of both testes demonstrates low resistance arterial and venous waveforms bilaterally.  IMPRESSION: 1. No evidence of testicular torsion; no evidence of epididymitis. 2. Small left epididymal head cyst noted.   Electronically Signed   By: Garald Balding M.D.   On: 09/06/2013 00:24   US Scrotum  08/28/2013   CLINICAL DATA:  Edematous and erythematous scrotum. Current history of diabetes.  EXAM: SCROTAL ULTRASOUND  DOPPLER ULTRASOUND OF THE TESTICLES  TECHNIQUE: Complete ultrasound examination of the testicles, epididymis, and other scrotal structures was performed. Color and spectral Doppler ultrasound were also utilized to evaluate blood flow to the testicles.  COMPARISON:  None.  FINDINGS: Right testicle  Measurements: Approximately 3.6 x 1.7 x 2.5 cm. No focal parenchymal abnormality. Mild hyperemia on color Doppler evaluation.  Left testicle  Measurements: Approximately 3.1 x 1.9 x 3.3 cm. No focal parenchymal abnormality. Marked hyperemia on color Doppler evaluation.  Right epididymis: Normal in size and appearance. No evidence of hyperemia on color Doppler evaluation.  Left epididymis: Normal in size containing an approximate 1.1 x 0.7 cm cyst or spermatocele. No evidence of hyperemia on color Doppler evaluation.  Hydrocele:  None visualized.  Varicocele:  None visualized.  Pulsed Doppler interrogation of both testes demonstrates normal low  resistance arterial and venous waveforms bilaterally.  IMPRESSION: 1. No evidence of testicular torsion. 2. Hyperemia involving both testicles, left greater than right, consistent with orchitis. 3. No evidence of epididymitis. 4. Cyst or spermatocele involving the left epididymal head.   Electronically Signed   By: Evangeline Dakin M.D.   On: 08/28/2013 00:13   US Renal  08/28/2013   CLINICAL DATA:  Rule out hydronephrosis  EXAM: RENAL/URINARY TRACT ULTRASOUND COMPLETE  COMPARISON:  Renal ultrasound 03/20/2013  FINDINGS: Right Kidney:  Length: 12.8 cm. 2.2 cm midpole cyst. Echogenicity within normal limits. No mass or hydronephrosis visualized.  Left Kidney:  Length: 12.8 cm. 2.1 cm lower pole cyst. Echogenicity within normal limits. No mass or hydronephrosis visualized.  Bladder:  Appears normal for degree of bladder distention.  Gallstones are noted.  IMPRESSION: Normal renal size.  No obstruction.  Cholelithiasis.   Electronically Signed   By: Franchot Gallo M.D.   On: 08/28/2013 08:35   [place] Art/ven Flow Abd Pelv Doppler  09/06/2013   CLINICAL DATA:  Right scrotal tenderness. Evaluate for torsion or epididymitis.  EXAM: SCROTAL ULTRASOUND  DOPPLER ULTRASOUND OF THE TESTICLES  TECHNIQUE: Complete ultrasound examination of the testicles, epididymis, and other scrotal structures was performed. Color and spectral Doppler ultrasound were also utilized to evaluate blood flow to the testicles.  COMPARISON:  None.  FINDINGS: Right testicle  Measurements: 3.6 x 1.8 x 2.5 cm. No mass or microlithiasis visualized.  Left testicle  Measurements: 3.1 x 2.0 x 2.5 cm. No mass or microlithiasis visualized.  Right epididymis:  Normal in size and appearance.  Left epididymis:  A 0.8 cm cyst is noted at the epididymal head.  Hydrocele:  None visualized.  Varicocele:  None visualized.  Pulsed Doppler interrogation of both testes demonstrates low resistance arterial and venous waveforms bilaterally.  IMPRESSION: 1. No evidence of  testicular torsion; no evidence of epididymitis. 2. Small left epididymal head cyst noted.   Electronically Signed   By: Garald Balding M.D.   On: 09/06/2013 00:24   [place] Art/ven Flow  Abd Pelv Doppler  08/30/2013   CLINICAL DATA:  Edematous and erythematous scrotum. Current history of diabetes.  EXAM: SCROTAL ULTRASOUND  DOPPLER ULTRASOUND OF THE TESTICLES  TECHNIQUE: Complete ultrasound examination of the testicles, epididymis, and other scrotal structures was performed. Color and spectral Doppler ultrasound were also utilized to evaluate blood flow to the testicles.  COMPARISON:  None.  FINDINGS: Right testicle  Measurements: Approximately 3.6 x 1.7 x 2.5 cm . No focal parenchymal abnormality. Mild hyperemia on color Doppler evaluation.  Left testicle  Measurements: Approximately 3.1 x 1.9 x 3.3 cm. . No focal parenchymal abnormality. Marked hyperemia on color Doppler evaluation.  Right epididymis: Normal in size and appearance. No evidence of hyperemia on color Doppler evaluation.  Left epididymis: Normal in size containing an approximate 1.1 x 0.7 cm cyst or spermatocele. No evidence of hyperemia on color Doppler evaluation.  Hydrocele: None visualized.  Varicocele: None visualized.  Pulsed Doppler interrogation of both testes demonstrates normal low resistance arterial and venous waveforms bilaterally.  IMPRESSION: 1. No evidence of testicular torsion. 2. Hyperemia involving both testicles, left greater than right, consistent with orchitis. 3. No evidence of epididymitis. 4. Cyst or spermatocele involving the left epididymal head.   Electronically Signed   By: Evangeline Dakin M.D.   On: 08/30/2013 10:26   Dg Chest Portable 1 View  09/05/2013   CLINICAL DATA:  Sepsis.  EXAM: PORTABLE CHEST - 1 VIEW  COMPARISON:  Chest radiograph from 08/29/2013  FINDINGS: The lungs are well-aerated. Pulmonary vascularity is at the upper limits of normal. There is no evidence of focal opacification, pleural effusion or  pneumothorax.  The cardiomediastinal silhouette is borderline enlarged. A pacemaker is seen overlying the right chest wall, with leads ending overlying the right atrium and right ventricle. No acute osseous abnormalities are seen.  IMPRESSION: Borderline cardiomegaly; lungs remain grossly clear.   Electronically Signed   By: Garald Balding M.D.   On: 09/05/2013 23:16   Dg Chest Port 1 View  08/27/2013   CLINICAL DATA:  Hypotension  EXAM: PORTABLE CHEST - 1 VIEW  COMPARISON:  10/03/2012  FINDINGS: The heart remains moderately enlarged. Mediastinum remains prominent. No pneumothorax or pleural effusion. 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  IMPRESSION: Cardiomegaly without pulmonary edema.   Electronically Signed   By: Maryclare Bean M.D.   On: 08/27/2013 17:01   Dg Knee Complete 4 Views Right  09/05/2013   CLINICAL DATA:  Right knee pain.  EXAM: RIGHT KNEE - COMPLETE 4+ VIEW  COMPARISON:  DG KNEE 2 VIEWS*R* dated 01/31/2008  FINDINGS: There is no evidence of fracture or dislocation. The joint spaces are preserved. Marginal osteophytes are seen at the lateral compartment; the patellofemoral joint is grossly unremarkable in appearance. Calcification along the medial aspect of the medial femoral condyle is somewhat higher than expected for a Pellegrini-Stieda lesion, and may reflect calcification within the distal musculature.  A small knee joint effusion is suspected. Scattered vascular calcifications are seen.  IMPRESSION: 1. No evidence of fracture or dislocation. 2. Suspect small knee joint effusion. 3. Calcification along the medial aspect of the medial femoral condyle may reflect prior injury to overlying musculature.   Electronically Signed   By: Garald Balding M.D.   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 Patient was recently discharged on 08/29/13 after admission for sepsis 2/2 UTI vs. orchitis, acute on CKD, also found to be influenza + and with trichomonas on UA.  Patient was seen at [facility] today where his BP was noted to be 93/36. He was given NS 1 L bolus x 2 with resolution of hypotension to 140/85. However, after unknown amount of time, BP was rechecked and found to be 90/40; provider also felt that patient was somewhat lethargic. Intake vitals here showed BP of 86/48. However, patient is not symptomatic as he denies lightheadedness, palpitations, fatigue. He also denies headaches, chest pain, shortness of breath, cough, abdominal pain, nausea/vomiting, diarrhea. No urinary symptoms including dysuria, frequency, hematuria. No loss of consciousness or recent falls.  Patient's only complaint is pain in his right knee. He has chronic pain in that knee thought due to DJD though he states it has been worse for past 2 weeks. He does not have any pain above or below his knee. Denies fevers and other joint pain, cannot comment on redness since he does not see well. States that the pain has been present since before his last hospitalization but worsening as he usually is able to ambulate with walker at home but has not been able to bear weight on that knee recently. He has been lying in bed more as a result but denies pain or swelling of his calf.  Patient states he completed the full course of ciprofloxacin and Tamiflu that he has prescribed at his last hospital discharge. However, he is unable to provide much information with regard to his medications as he just takes whatever is in his "bubble packet" from [place] (legally blind). He did take all of his medications this morning. He lives with two friends at home, neither of  which have been sick.  Of note, per cardiology note from 05/2013, his pacemaker shows persistent atrial fibrillation since 01/2013. He was started on amiodarone at that time but has remained in arrythmia though rate controlled. He no longer takes Coumadin as it was felt that he is not a good candidate thus he should be on ASA 162 mg daily. He was to be seen for follow-up next month.   Hospital Course by problem list: Principal Problem:   Hypotension Active Problems:   Type 2 diabetes mellitus, uncontrolled   Atrial fibrillation   Unspecified protein-calorie malnutrition   Right knee pain   CKD (chronic kidney disease) stage 3, GFR 30-59 ml/min   Hypotension--resolved during admission.  BP on admission 90/40's but improved with holding BP medications and IVF.  BP on discharge 167/89. Coreg, and IMDUR were restarted. Clonidine dose reduced to 0.1mg  bid. Lasix and Lisinopril held during admission and on discharge due to renal function and will need to be addressed by PCP when and if to be restarted and possible change in dose.   R knee pain--likely secondary to gout flare.  Uric acid was 5.5 and previously elevated.  Home medications include colchicine and uloric that were restarted during admission and patient noted improvement in pain.  R knee xray with no evidence of fracture or dislocation, small knee joint effusion, and calcification along medial aspect of medial femoral condyle.  Recommend follow up with PCP. If febrile or signs of infection, concern for possible septic arthritis will need to be addressed and possible aspiration  of knee if worsening.  Home health PT.   Normocytic Anemia - Pt with Hg of 10.5 on admission and improved to 12.0 close to discharge.  No evidence of active bleeding. Follow up with PCP.   Atrial Fibrillation--remained in sinus rhythm during admission. Pacemaker in place but has been in rate-controlled atrial fibrillation. CHADS2 score of 3; not on Coumadin due to hx of  rectal bleeding. Continued on daily aspirin and amiodarone.    Insulin-dependent Type II DM--stable during admission.  HbA1C 8.2 ON 08/28/2013. Home medications include Lantus 26 U daily and Novolog 8 U BID with meals and Byetta.  CBG's maintined on only sliding scare during admission.  Will discharge on Lantus 8 units but will hold novolog and Byetta.  Recommend follow up with pcp and adjustment of insulin as needed.    Chronic Kidney Disease Stage 3--Cr 2.23 on admission, improved to 1.64 this admission.  Baseline ~2?  Possibly secondary to hypotension, ACEi, and lasix.  Holding lasix and ACEi this admission. Recommend recheck bmet on discharge and adjust medications as needed.       Grade 1 Diastolic Heart Failure - on 01/01/13 2D-echo with EF 50-55%. Discharge weight 295lb's.  Resumed coreg and IMDUR and lowered dose of clonidine. Holding lasix and lisinopril given hypotension on admission and renal function.    OSA--on CPAP at home and continued during discharge.   GERD--continued on pantoprazole 40 mg daily   Discharge Vitals:   BP 167/89  Pulse 75  Temp(Src) 98.4 F (36.9 C) (Oral)  Resp 20  Ht 6' (1.829 m)  Wt 295 lb 9.6 oz (134.083 kg)  BMI 40.08 kg/m2  SpO2 96%  Discharge Labs:  Results for orders placed during the hospital encounter of 09/05/13 (from the past 24 hour(s))  GLUCOSE, CAPILLARY     Status: Abnormal   Collection Time    09/06/13  5:05 PM      Result Value Range   Glucose-Capillary 188 (*) 70 - 99 mg/dL  GLUCOSE, CAPILLARY     Status: Abnormal   Collection Time    09/06/13  7:45 PM      Result Value Range   Glucose-Capillary 188 (*) 70 - 99 mg/dL  GLUCOSE, CAPILLARY     Status: Abnormal   Collection Time    09/07/13  7:50 AM      Result Value Range   Glucose-Capillary 160 (*) 70 - 99 mg/dL  GLUCOSE, CAPILLARY     Status: Abnormal   Collection Time    09/07/13 12:21 PM      Result Value Range   Glucose-Capillary 176 (*) 70 - 99 mg/dL   Signed: Jerene Pitch, MD 09/07/2013, 4:20 PM   Time Spent on Discharge: 45 minutes Services Ordered on Discharge: home health PT, OT Equipment Ordered on Discharge: none

## 2013-09-07 NOTE — Progress Notes (Signed)
Physical Therapy Treatment Patient Details Name: [name] MRN: [id number] DOB: Dec 31, 1945 Today's Date: 09/07/2013 Time: [id number] PT Time Calculation (min): 18 min  PT Assessment / Plan / Recommendation  History of Present Illness Patient is a 68 y/o male admitted with hypotension.  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   PT Comments   Pt. Overall progressing slowly towards goals.    Follow Up Recommendations  Outpatient PT (Continue at [facility])     Does the patient have the potential to tolerate intense rehabilitation     Barriers to Discharge        Equipment Recommendations  None recommended by PT    Recommendations for Other Services    Frequency Min 3X/week   Progress towards PT Goals Progress towards PT goals: Progressing toward goals  Plan Current plan remains appropriate    Precautions / Restrictions Precautions Precautions: Fall Restrictions Weight Bearing Restrictions: No   Pertinent Vitals/Pain C/o R klnee "soreness" did not rate pain    Mobility  Bed Mobility Overal bed mobility: Needs Assistance General bed mobility comments: pt. sitting EOB upon entering room Transfers Overall transfer level: Needs assistance Equipment used: Rolling walker (2 wheeled) Transfers: Sit to/from [facility] Sit to Stand: +2 physical assistance (pt. 60%) Stand pivot transfers: +2 safety/equipment;Mod assist General transfer comment: cues for safety with sit to stand as well as weight shifting to bring RUE to walker; tactile cues for guidance in unfamiliar environment    Exercises     PT Diagnosis:    PT Problem List:   PT Treatment Interventions:     PT Goals  (current goals can now be found in the care plan section) Acute Rehab PT Goals Patient Stated Goal: To go home Time For Goal Achievement: 09/20/13 Potential to Achieve Goals: Good  Visit Information  Last PT Received On: 09/07/13 Assistance Needed: +2 History of Present Illness: Patient is a 68 y/o male admitted with hypotension.  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    Subjective Data  Subjective: "I'm supposed to wear the CAM on my L, and blue shoe on my R." Patient Stated Goal: To go home   Cognition  Cognition Arousal/Alertness: Awake/alert Behavior During Therapy: WFL for tasks assessed/performed Overall Cognitive Status: Within Functional Limits for tasks assessed    Balance  Balance Overall balance assessment: Needs assistance Sitting-balance support: Feet supported;Single extremity supported Sitting balance-Leahy Scale: Good Standing balance support: During functional activity;Bilateral upper extremity supported Standing balance-Leahy Scale: Fair  End of Session PT - End of Session Equipment Utilized During Treatment: Gait belt Activity Tolerance: Patient limited by pain;Patient tolerated treatment well Patient left: in chair;with call bell/phone within reach Nurse Communication: Mobility status   GP     Faustino Congress K 09/07/2013, 9:34 AM  Laureen Abrahams, PT, DPT [phone]

## 2013-09-07 NOTE — Progress Notes (Signed)
Subjective:  Pt seen and examined in AM. No acute events overnight. Pt reports right knee pain has improved since yesterday. He denies lightheadedness, chest pain, dyspnea, or syncope.     Objective: Vital signs in last 24 hours: Filed Vitals:   09/07/13 0500 09/07/13 0926 09/07/13 1000 09/07/13 1100  BP: 168/91 184/93 154/88 167/89  Pulse: 72 80 72 75  Temp: 98.6 F (37 C) 98.6 F (37 C)  98.4 F (36.9 C)  TempSrc: Oral Oral  Oral  Resp: 20 20  20   Height:      Weight:      SpO2: 97% 97%  96%   Weight change: 4.581 kg (10 lb 1.6 oz)  Intake/Output Summary (Last 24 hours) at 09/07/13 1229 Last data filed at 09/07/13 0926  Gross per 24 hour  Intake    960 ml  Output      0 ml  Net    960 ml    General: alert, cooperative, NAD HEENT: NCAT, vision decreased at baseline (legally blind), mmm  Neck: supple, no lymphadenopathy Lungs: clear to ascultation bilaterally, normal work of respiration, no wheezes, rales, ronchi Heart: regular rate and rhythm, no murmurs, gallops, or rubs Abdomen: soft, non-tender, non-distended, normal bowel sounds  Extremities: right knee with mild swelling, no warmth or erythema. Improved ROM. no calf pain, s/p left transmetatarsel amputation (clean, dry, intact without erythema or drainage), no edema  Neurologic: alert & oriented X3, cranial nerves II-XII intact, strength grossly intact, sensation intact to light touch    Lab Results: Basic Metabolic Panel:  Recent Labs Lab 09/05/13 1944 09/06/13 0358 09/06/13 1058  NA 137 135* 134*  K 4.4 4.4 4.3  CL 98 99 97  CO2 22 23 21   GLUCOSE 214* 139* 240*  BUN 34* 33* 29*  CREATININE 2.23* 1.89* 1.64*  CALCIUM 8.8 8.7 9.3  MG 2.2  --   --   PHOS 3.9  --   --    Liver Function Tests:  Recent Labs Lab 09/05/13 1944  AST 63*  ALT 67*  ALKPHOS 125*  BILITOT 0.5  PROT 7.2  ALBUMIN 2.4*   No results found for this basename: LIPASE, AMYLASE,  in the last 168 hours No results  found for this basename: AMMONIA,  in the last 168 hours CBC:  Recent Labs Lab 09/05/13 1944 09/06/13 1058  WBC 14.0* 12.5*  NEUTROABS 10.0*  --   HGB 10.5* 12.0*  HCT 33.0* 36.6*  MCV 83.3 81.7  PLT 197 220   Cardiac Enzymes:  Recent Labs Lab 09/05/13 2315  TROPONINI <0.30   BNP: No results found for this basename: PROBNP,  in the last 168 hours D-Dimer: No results found for this basename: DDIMER,  in the last 168 hours CBG:  Recent Labs Lab 09/05/13 2138 09/06/13 0801 09/06/13 1159 09/06/13 1705 09/06/13 1945 09/07/13 0750  GLUCAP 167* 139* 218* 188* 188* 160*   Hemoglobin A1C: No results found for this basename: HGBA1C,  in the last 168 hours Fasting Lipid Panel: No results found for this basename: CHOL, HDL, LDLCALC, TRIG, CHOLHDL, LDLDIRECT,  in the last 168 hours Thyroid Function Tests: No results found for this basename: TSH, T4TOTAL, FREET4, T3FREE, THYROIDAB,  in the last 168 hours Coagulation:  Recent Labs Lab 09/05/13 2315  LABPROT 15.9*  INR 1.30   Anemia Panel: No results found for this basename: VITAMINB12, FOLATE, FERRITIN, TIBC, IRON, RETICCTPCT,  in the last 168 hours Urine Drug Screen: Drugs of Abuse  Component Value Date/Time   LABOPIA NEG 12/16/2008 2246   COCAINSCRNUR NEG 12/16/2008 2246   LABBENZ NEG 12/16/2008 2246   AMPHETMU NEG 12/16/2008 2246    Alcohol Level: No results found for this basename: ETH,  in the last 168 hours Urinalysis:  Recent Labs Lab 09/05/13 2045  Anna 1.017  PHURINE 5.0  Xenia 0.2  NITRITE NEGATIVE  LEUKOCYTESUR TRACE*    Micro Results: Recent Results (from the past 240 hour(s))  CULTURE, BLOOD (ROUTINE X 2)     Status: None   Collection Time    09/05/13 11:15 PM      Result Value Range Status   Specimen Description BLOOD RIGHT ARM   Final   Special Requests  BOTTLES DRAWN AEROBIC ONLY 5CC   Final   Culture  Setup Time     Final   Value: 09/06/2013 04:00     Performed at [facility]   Culture     Final   Value:        BLOOD CULTURE RECEIVED NO GROWTH TO DATE CULTURE WILL BE HELD FOR 5 DAYS BEFORE ISSUING A FINAL NEGATIVE REPORT     Performed at [facility]   Report Status PENDING   Incomplete  CULTURE, BLOOD (ROUTINE X 2)     Status: None   Collection Time    09/05/13 11:20 PM      Result Value Range Status   Specimen Description BLOOD RIGHT HAND   Final   Special Requests BOTTLES DRAWN AEROBIC ONLY 4CC   Final   Culture  Setup Time     Final   Value: 09/06/2013 04:00     Performed at [facility]   Culture     Final   Value:        BLOOD CULTURE RECEIVED NO GROWTH TO DATE CULTURE WILL BE HELD FOR 5 DAYS BEFORE ISSUING A FINAL NEGATIVE REPORT     Performed at [facility]   Report Status PENDING   Incomplete   Studies/Results: US Scrotum  09/06/2013   CLINICAL DATA:  Right scrotal tenderness. Evaluate for torsion or epididymitis.  EXAM: SCROTAL ULTRASOUND  DOPPLER ULTRASOUND OF THE TESTICLES  TECHNIQUE: Complete ultrasound examination of the testicles, epididymis, and other scrotal structures was performed. Color and spectral Doppler ultrasound were also utilized to evaluate blood flow to the testicles.  COMPARISON:  None.  FINDINGS: Right testicle  Measurements: 3.6 x 1.8 x 2.5 cm. No mass or microlithiasis visualized.  Left testicle  Measurements: 3.1 x 2.0 x 2.5 cm. No mass or microlithiasis visualized.  Right epididymis:  Normal in size and appearance.  Left epididymis:  A 0.8 cm cyst is noted at the epididymal head.  Hydrocele:  None visualized.  Varicocele:  None visualized.  Pulsed Doppler interrogation of both testes demonstrates low resistance arterial and venous waveforms bilaterally.  IMPRESSION: 1. No evidence of testicular torsion; no evidence of epididymitis. 2. Small left epididymal head cyst noted.    Electronically Signed   By: Garald Balding M.D.   On: 09/06/2013 00:24   [place] Art/ven Flow Abd Pelv Doppler  09/06/2013   CLINICAL DATA:  Right scrotal tenderness. Evaluate for torsion or epididymitis.  EXAM: SCROTAL ULTRASOUND  DOPPLER ULTRASOUND OF THE TESTICLES  TECHNIQUE: Complete ultrasound examination of the testicles, epididymis, and other scrotal structures was performed. Color and spectral Doppler ultrasound were also  utilized to evaluate blood flow to the testicles.  COMPARISON:  None.  FINDINGS: Right testicle  Measurements: 3.6 x 1.8 x 2.5 cm. No mass or microlithiasis visualized.  Left testicle  Measurements: 3.1 x 2.0 x 2.5 cm. No mass or microlithiasis visualized.  Right epididymis:  Normal in size and appearance.  Left epididymis:  A 0.8 cm cyst is noted at the epididymal head.  Hydrocele:  None visualized.  Varicocele:  None visualized.  Pulsed Doppler interrogation of both testes demonstrates low resistance arterial and venous waveforms bilaterally.  IMPRESSION: 1. No evidence of testicular torsion; no evidence of epididymitis. 2. Small left epididymal head cyst noted.   Electronically Signed   By: Garald Balding M.D.   On: 09/06/2013 00:24   Dg Chest Portable 1 View  09/05/2013   CLINICAL DATA:  Sepsis.  EXAM: PORTABLE CHEST - 1 VIEW  COMPARISON:  Chest radiograph from 08/29/2013  FINDINGS: The lungs are well-aerated. Pulmonary vascularity is at the upper limits of normal. There is no evidence of focal opacification, pleural effusion or pneumothorax.  The cardiomediastinal silhouette is borderline enlarged. A pacemaker is seen overlying the right chest wall, with leads ending overlying the right atrium and right ventricle. No acute osseous abnormalities are seen.  IMPRESSION: Borderline cardiomegaly; lungs remain grossly clear.   Electronically Signed   By: Garald Balding M.D.   On: 09/05/2013 23:16   Dg Knee Complete 4 Views Right  09/05/2013   CLINICAL DATA:  Right knee pain.  EXAM:  RIGHT KNEE - COMPLETE 4+ VIEW  COMPARISON:  DG KNEE 2 VIEWS*R* dated 01/31/2008  FINDINGS: There is no evidence of fracture or dislocation. The joint spaces are preserved. Marginal osteophytes are seen at the lateral compartment; the patellofemoral joint is grossly unremarkable in appearance. Calcification along the medial aspect of the medial femoral condyle is somewhat higher than expected for a Pellegrini-Stieda lesion, and may reflect calcification within the distal musculature.  A small knee joint effusion is suspected. Scattered vascular calcifications are seen.  IMPRESSION: 1. No evidence of fracture or dislocation. 2. Suspect small knee joint effusion. 3. Calcification along the medial aspect of the medial femoral condyle may reflect prior injury to overlying musculature.   Electronically Signed   By: Garald Balding M.D.   On: 09/05/2013 21:25   Medications: I have reviewed the patient's current medications.  Scheduled Meds: . amiodarone  200 mg Oral Daily  . aspirin  162 mg Oral Daily  . atorvastatin  80 mg Oral q1800  . brimonidine  1 drop Both Eyes TID  . carbamazepine  200 mg Oral BID  . carvedilol  25 mg Oral BID WC  . cholecalciferol  1,000 Units Oral QHS  . colchicine  0.6 mg Oral BID  . dorzolamide-timolol  1 drop Both Eyes BID  . febuxostat  40 mg Oral QHS  . heparin subcutaneous  5,000 Units Subcutaneous Q8H  . insulin aspart  0-15 Units Subcutaneous TID WC  . isosorbide mononitrate  30 mg Oral Daily  . pantoprazole  40 mg Oral Daily  . 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  Plan:   Hypotension  - resolved. Now hypertensive 167/89.  Most likely due to hypovolemia vs anti-hypertensives vs autonomic dysfunction in setting of uncontrolled DM.  -Hold home clonidine, decrease clonidine 0.2 mg BID to 0.1mg  BID on discharge -Hold home lisinopril 20 mg daily and on discharge, resume as outpatient  -Hold home furosemide 40 mg daily and on discharge, resume as outpatient -Continue home carvedilol 25 mg BID and on discharge -Blood cultures x 2 --> NGTD -UA without evidence of UTI, cultures with no growth -Cortisol level --> wnl   Right Knee Pain - improving.  Most likely due to acute gouty flare considering leukocytosis, swelling, pain, and warmth. Uric acid 5.5. Joint aspiration not performed due to minimal fluid.  -Xray left knee ---> small knee joint effusion, no evidence of fracture or dislocation -Start colchicine 0.6 mg BID and continue at discharge until flare resolves  -Continue febuxostat 40 mg daily -Nocro/vicodin 5-325 mg 1-2mg  q6hr for pain -Monitor leukocytosis --> resolving  -Obtain PT/OT consults ---> [facility] PT/OT   Normocytic Anemia - Pt with Hg of 10.5 on admission,now 12.0 with no evidence of active bleeding. Etiology most likely due to CKD.  -Continue to monitor for bleeding   Atrial Fibrillation - currently in sinus rhythm with normal HR. Pacemaker in place but has been in rate-controlled atrial fibrillation. CHADS2 score of 3; not on Coumadin due to rectal bleeding.  -Continue daily aspirin 81 mg daily  -Continue home amidarone 200 mg daily   Insulin-dependent Type II DM - Last A1c 8.2. Pt on Lantus 26 U daily and Novolog 8 U BID with meals and [place] -Insulin sliding scale -Hold home Lantus, consider restarting if uncontrollled   Chronic Kidney Disease Stage 3 -  Cr of 1.64 below baseline 2.0.  -Continue to monitor renal function  Grade 1 Diastolic  Heart Failure - on 01/01/13 2D-echo with EF 50-55% -Continue to monitor volume status -Daily weight (286 to 295?) and I & O's (325 mL urine output)  OSA - Pt on CPAP at home -CPAP at bedtime as tolerated  GERD - Currently without reflux symptoms  -Continue pantoprazole 40 mg daily    Diet:  Low Sodium  DVT PPx: SQ Heparin TID  Code: Full    Dispo: Disposition is deferred at this time, awaiting improvement of current medical problems.  Anticipated discharge in approximately 1 day.   The patient does have a current PCP [name], MD) and does need an [facility] hospital follow-up appointment after discharge.  The patient does have transportation limitations that hinder transportation to clinic appointments.  .Services Needed at time of discharge: Y = Yes, Blank = No PT:   OT:   RN:   Equipment:   Other:     LOS: 2 days   Juluis Mire, MD 09/07/2013, 12:29 PM

## 2013-09-07 NOTE — Progress Notes (Signed)
Patient refused CPAP.  RT made patient aware that if he changed his mind that RT would bring machine and set him up to wear it for the night.  RT will monitor.

## 2013-09-07 NOTE — Progress Notes (Signed)
Patient was discharged home with [facility]. Patient  and [facility] RN were given discharge instructions and information on prescriptions. Patient was discharged with belongings. Patient stated he came in with a wheelchair. Security was notified and forms were filled out for missing wheelchair. Patient will be notified once it is found. Copy of the missing wheelchair form was given to [facility] RN. Patient was stable upon discharge.

## 2013-09-07 NOTE — Progress Notes (Signed)
09/06/13 1625  OT G-codes **NOT FOR INPATIENT CLASS**  Functional Assessment Tool Used clinical observation and chart review (Clincal observation and chart review)  Functional Limitation Self care  Self Care Current Status [id number]) CL  Self Care Goal Status [id number]) CL  Self Care Discharge Status [id number]) CL  Late entry for Bea Graff, OTR/L however I was present during the eval. [place], OTR/L [phone] 09/07/2013

## 2013-09-10 ENCOUNTER — Telehealth: Payer: Self-pay | Admitting: *Deleted

## 2013-09-10 ENCOUNTER — Telehealth: Payer: Self-pay | Admitting: Dietician

## 2013-09-10 NOTE — Telephone Encounter (Signed)
Returned call to [place].  Stated pt is scheduled for a pacer check tomorrow and just had one 3 months ago.  Stated she asked that someone call back to explain why pt needs to have it done every 3 months b/c she thought it was every 6 months.  Maryanne informed pacer checks are usually every 3 months and if pt has a remote device to check at home, then he would not need to come to the office.  Otherwise, pt comes every 3 months to have it checked and sees the MD every year to have it checked.  Maryanne verbalized understanding and confirmed appt for tomorrow at 11:30am w/ Dr. Sallyanne Kuster.

## 2013-09-10 NOTE — Telephone Encounter (Signed)
Bevely Palmer for [facility] needs to speak to someone about [name] appointment. She stated to have her paged to speak to her directly.   [place]

## 2013-09-10 NOTE — Progress Notes (Signed)
PT Eval Addendum 09/06/13 0906  PT G-Codes **NOT FOR INPATIENT CLASS**  Functional Assessment Tool Used Clinical Judgement  Functional Limitation Mobility: Walking and moving around  Mobility: Walking and Moving Around Current Status [phone]) CJ  Mobility: Walking and Moving Around Goal Status [phone]) Tempie Donning, [place] 09/10/2013

## 2013-09-10 NOTE — Telephone Encounter (Signed)
Calling to assist with transition of care from hospital to home. Patient was at [facility] at time of call.  Note his PCP is a [facility] doctor.  Will follow up as needed.

## 2013-09-11 ENCOUNTER — Encounter: Payer: Self-pay | Admitting: Cardiovascular Disease

## 2013-09-11 ENCOUNTER — Ambulatory Visit (INDEPENDENT_AMBULATORY_CARE_PROVIDER_SITE_OTHER): Payer: Medicare (Managed Care) | Admitting: Cardiovascular Disease

## 2013-09-11 VITALS — BP 136/82 | HR 78 | Resp 20 | Ht 72.0 in | Wt 290.0 lb

## 2013-09-11 DIAGNOSIS — I43 Cardiomyopathy in diseases classified elsewhere: Secondary | ICD-10-CM

## 2013-09-11 DIAGNOSIS — R5383 Other fatigue: Secondary | ICD-10-CM

## 2013-09-11 DIAGNOSIS — I4891 Unspecified atrial fibrillation: Secondary | ICD-10-CM

## 2013-09-11 DIAGNOSIS — I2510 Atherosclerotic heart disease of native coronary artery without angina pectoris: Secondary | ICD-10-CM

## 2013-09-11 DIAGNOSIS — I119 Hypertensive heart disease without heart failure: Secondary | ICD-10-CM

## 2013-09-11 DIAGNOSIS — I428 Other cardiomyopathies: Secondary | ICD-10-CM

## 2013-09-11 DIAGNOSIS — N183 Chronic kidney disease, stage 3 unspecified: Secondary | ICD-10-CM

## 2013-09-11 DIAGNOSIS — Z950 Presence of cardiac pacemaker: Secondary | ICD-10-CM

## 2013-09-11 DIAGNOSIS — Z79899 Other long term (current) drug therapy: Secondary | ICD-10-CM

## 2013-09-11 DIAGNOSIS — R5381 Other malaise: Secondary | ICD-10-CM

## 2013-09-11 DIAGNOSIS — I10 Essential (primary) hypertension: Secondary | ICD-10-CM

## 2013-09-11 LAB — PACEMAKER DEVICE OBSERVATION

## 2013-09-11 NOTE — Patient Instructions (Signed)
Have lab work done at [facility].  Dr. Sallyanne Kuster recommends that you schedule a follow-up appointment in: 3 months office visit and pacemaker interrogation.

## 2013-09-11 NOTE — Assessment & Plan Note (Signed)
Recheck creatinine later this week to ensure its stability. May still benefit from ACE inhibitor therapy to prevent progression of diabetic nephropathy. Consider nephrology consultation.

## 2013-09-11 NOTE — Assessment & Plan Note (Signed)
No clinical evidence of hypervolemia. Functional class is not assessed due to orthopedic problems

## 2013-09-11 NOTE — Assessment & Plan Note (Signed)
Target BP less than 130/80 mm Hg. Restart ACE inhibitor if renal function allows

## 2013-09-11 NOTE — Assessment & Plan Note (Signed)
Currently asymptomatic. Coronary angiography in 2005 showed moderate LAD and circumflex obstruction

## 2013-09-11 NOTE — Progress Notes (Signed)
Patient ID: [name], male   DOB: 23-Nov-1945, 68 y.o.   MRN: [id number]      Reason for office visit Atrial Fibrillation, pacemaker, AS, HTN  [name] is a 68 year old morbidly obese man with a history of diabetes mellitus, hypertension, obstructive sleep apnea on CPAP, mild valvular aortic stenosis, mild coronary atherosclerosis treated medically, legal blindness, severe diabetic peripheral neuropathy complicated by a poorly healing wounds, chronic venous insufficiency and a dual-chamber permanent pacemaker that was implanted in 2005 for sinus node dysfunction. I performed his pacemaker generator change in March 2014 ([employer] Adapta). He has preserved left ventricular systolic function but has echo findings suggestive of mild diastolic dysfunction.  He has not had any new recent health challenges. His activities severely limited by his vision problems. He denies any chest pain, edema, dizziness or syncope or any change in his pattern of chronic shortness of breath. His primary care physician stopped his warfarin anticoagulation because of rectal bleeding.  His pacemaker shows persistent atrial fibrillation since June. He was started on amiodarone at that time but had remained in the arrhythmia without interruption when I last saw him in October. Surprisingly he did have late conversion to sinus rhythm and has maintained this without interruption for about 3 months now.  He underwent a left transmetatarsal amputation in early November for a gangrenous ulcer. He was subsequently hospitalized twice earlier this month for "the flu" and subsequently hypotension with acute on chronic renal insufficiency. His diuretic, furosemide, as well as lisinopril were discontinued. His blood pressure today is normal. His creatinine on November 21 was 2.2, but subsequently retreated to 1.7 which is probably his baseline.  Today he has no complaints.   No Known Allergies  Current Outpatient Prescriptions    Medication Sig Dispense Refill  . albuterol (PROVENTIL HFA;VENTOLIN HFA) 108 (90 BASE) MCG/ACT inhaler Inhale 2 puffs into the lungs every 6 (six) hours as needed for wheezing.      Marland Kitchen amiodarone (PACERONE) 200 MG tablet Take 200 mg by mouth daily.      Marland Kitchen aspirin 81 MG chewable tablet Chew 81 mg by mouth daily.      . brimonidine (ALPHAGAN P) 0.1 % SOLN Place 1 drop into both eyes every 8 (eight) hours.      . carbamazepine (TEGRETOL) 200 MG tablet Take 200 mg by mouth 2 (two) times daily.      . carvedilol (COREG) 25 MG tablet Take 25 mg by mouth 2 (two) times daily with a meal.       . cholecalciferol (VITAMIN D) 1000 UNITS tablet Take 1,000 Units by mouth at bedtime.      . cloNIDine (CATAPRES) 0.2 MG tablet Take 0.5 tablets (0.1 mg total) by mouth 2 (two) times daily.  60 tablet  1  . colchicine 0.6 MG tablet Take 0.6 mg by mouth 2 (two) times daily as needed (for gout flare-ups).       . dorzolamide-timolol (COSOPT) 22.3-6.8 MG/ML ophthalmic solution Place 1 drop into both eyes 2 (two) times daily.      . febuxostat (ULORIC) 40 MG tablet Take 40 mg by mouth at bedtime.      Marland Kitchen glucose blood test strip 1 each by Other route as needed. Use as instructed to check blood sugar before meals and at bedtime at least 2-3 time daily      . HYDROcodone-acetaminophen (NORCO) 5-325 MG per tablet Take 1-2 tablets by mouth every 6 (six) hours as needed.  30 tablet  0  .  insulin glargine (LANTUS) 100 UNIT/ML injection Inject 0.08 mLs (8 Units total) into the skin at bedtime.  10 mL  11  . Insulin Pen Needle 31G X 8 MM MISC Use to inject insulin 2x daily before breakfast and dinner.      . isosorbide mononitrate (IMDUR) 30 MG 24 hr tablet Take 30 mg by mouth daily.      . magnesium oxide (MAG-OX) 400 MG tablet Take 400 mg by mouth daily.      . methylcellulose (ARTIFICIAL TEARS) 1 % ophthalmic solution Place 1 drop into both eyes 2 (two) times daily.      Marland Kitchen omeprazole (PRILOSEC OTC) 20 MG tablet Take 20 mg by  mouth every evening.       . polyethylene glycol (MIRALAX / GLYCOLAX) packet Take 17 g by mouth daily.       . potassium chloride SA (K-DUR,KLOR-CON) 20 MEQ tablet Take 20 mEq by mouth 2 (two) times daily.       . rosuvastatin (CRESTOR) 40 MG tablet Take 40 mg by mouth every evening.      . sennosides-docusate sodium (SENOKOT-S) 8.6-50 MG tablet Take 2 tablets by mouth daily.       No current facility-administered medications for this visit.    Past Medical History  Diagnosis Date  . Diabetes type 2, uncontrolled   . Gout   . Hyperlipidemia   . Hypertension   . Obstructive sleep apnea     On CPAP. Last sleep study (06/2009) - Severe obstructive sleep apnea/hypopnea syndrome, apnea-hypopnea index 70.5 per hour with non positional events moderately loud snoring and oxygen desaturation to a nadir of 85% on room air.  . Benign prostatic hypertrophy   . Morbid obesity   . Cardiomyopathy, hypertensive      diastolic dysfunction by [id number] echo  . Coronary artery disease      nonobstructive. 2 vessel. cardiac catheter in March 2005 showing 60% LAD occlusion, 30% circumflex occlusion.  . 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 Legally blind   . Herpes   . Occult blood positive stool      History of in August 2007.  colonoscopy in January 2008 showing normal colon with fair inadequate prep. By Dr. Silvano Rusk.  . Incarcerated ventral hernia     H/O. S/P repair ventral hernia repair 07/2005.  . Diabetic ulcer of thigh     H/O Rt thigh ulcer.  . Diabetic peripheral neuropathy   . Exertional shortness of breath     "comes and goes" (06/23/2013)  . GERD (gastroesophageal reflux disease)   . Degenerative joint disease      Right knee.  . Arthritis     "all over" (06/23/2013)  . Sepsis 08/26/2013  . Atrial fibrillation     not Candidate for Coumadin d/t rectal bleeding  . Hypotension 09/05/2013    Past Surgical History  Procedure Laterality Date  . Pacemaker lead revision   12/2003     likely microdislodgment  of right ventricular lead  . Ventral hernia repair  07/2005     S/P laparotomy, lysis of adhesions, repair of incarcerated ventral hernia with  partial omentectomy.. Performed by Dr. Marlou Starks.  . Hernia repair    . Cataract extraction, bilateral Bilateral   . Refractive surgery Bilateral   . Eye surgery    . 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 Amputation Left 06/22/2013    Procedure: AMPUTATION FOOT;  Surgeon:  Newt Minion, MD;  Location: [facility];  Service: Orthopedics;  Laterality: Left;  Left Midfoot Amputation  . Insert / replace / remove pacemaker  2003-11-28    Secondary to symptomatic bradycardia. DDDR pacemaker. By Dr. Rollene Fare.  . Insert / replace / remove pacemaker  27-Nov-2012    "old pacemaker died; had new one put in" (13-Jul-2013)    Family History  Problem Relation Age of Onset  . Coronary artery disease Sister   . Heart disease Sister   . Allergies Sister   . Asthma Brother   . Rheum arthritis Brother     History   Social History  . Marital Status: Single    Spouse Name: N/A    Number of Children: Y  . Years of Education: N/A   Occupational History  . disabled    Social History Main Topics  . Smoking status: Former Smoker -- 0.00 packs/day for 0 years    Types: Cigarettes    Quit date: 08/17/2003  . Smokeless tobacco: Never Used     Comment: started at age 26.  only smoked on occ- very rarely.0  . Alcohol Use: Yes     Comment: Jul 13, 2013 "drank some; never had a problem w/it"  . Drug Use: No  . Sexual Activity: No   Other Topics Concern  . Not on file   Social History Narrative  . No narrative on file    Review of systems: The patient specifically denies any chest pain at rest or with exertion, dyspnea at rest or with exertion, orthopnea, paroxysmal nocturnal dyspnea, syncope, palpitations, focal neurological deficits, intermittent claudication, lower extremity edema, unexplained  weight gain, cough, hemoptysis or wheezing.  The patient also denies abdominal pain, nausea, vomiting, dysphagia, diarrhea, constipation, polyuria, polydipsia, dysuria, hematuria, frequency, urgency, abnormal bleeding or bruising, fever, chills, unexpected weight changes, mood swings, change in skin or hair texture, change in voice quality, auditory or visual problems, allergic reactions or rashes, new musculoskeletal complaints other than usual "aches and pains".     PHYSICAL EXAM BP 136/82  Pulse 78  Resp 20  Ht 6' (1.829 m)  Wt 131.543 kg (290 lb)  BMI 39.32 kg/m2  Exam is limited by morbid obesity  General: Alert, oriented x3, no distress  Head: no evidence of trauma, PERRL, EOMI, no exophtalmos or lid lag, no myxedema, no xanthelasma; normal ears, nose and oropharynx  Neck: Unable to evaluate jugular venous pulsations and no hepatojugular reflux; brisk carotid pulses without delay and no carotid bruits  Chest: clear to auscultation, no signs of consolidation by percussion or palpation, normal fremitus, symmetrical and full respiratory excursions  Cardiovascular: Cannot identify the apical impulse, irregular rhythm, normal first and second heart sounds, no murmurs, rubs or gallops  Abdomen: no tenderness or distention, no masses by palpation, no abnormal pulsatility or arterial bruits, normal bowel sounds, no hepatosplenomegaly  Extremities: He is wearing surgical boots on both feet. He has undergone amputation of the left forefoot. No clubbing, cyanosis or edema; 2+ radial, ulnar and brachial pulses bilaterally; 2+ right femoral, 1+posterior tibial and nonpalpable dorsalis pedis pulses; 2+ left femoral, nonpalpable posterior tibial and dorsalis pedis pulses; no subclavian or femoral bruits  Neurological: grossly nonfocal  EKG: Atrial paced ventricular sensed rhythm, QS in leads V1 through V2 and generalized nonspecific T wave flattening  Lipid Panel     Component Value Date/Time    CHOL 171 06/29/2010   TRIG 107 06/29/2010   HDL 32 06/29/2010   CHOLHDL 6.1 Ratio  05/15/2009 2055   VLDL 44* 05/15/2009 2055   LDLCALC 118 06/29/2010    BMET    Component Value Date/Time   NA 134* 09/06/2013 1058   K 4.3 09/06/2013 1058   CL 97 09/06/2013 1058   CO2 21 09/06/2013 1058   GLUCOSE 240* 09/06/2013 1058   BUN 29* 09/06/2013 1058   CREATININE 1.64* 09/06/2013 1058   CREATININE 2.28* 04/23/2013 1216   CALCIUM 9.3 09/06/2013 1058   GFRNONAA 42* 09/06/2013 1058   GFRAA 49* 09/06/2013 1058     ASSESSMENT AND PLAN Atrial fibrillation Quite unexpectedly, he has gone from persistent on interrupted atrial fibrillation that lasted for several months to complete abolition of the arrhythmia over the last 3 months. Continue amiodarone, with periodic reevaluation of liver tests and thyroid function tests. It is highly likely that atrial fibrillation will recur in the future. He is currently receiving only aspirin, rather than full dose anticoagulants, secondary to previous rectal bleeding. I would not hesitate to restart full dose anticoagulations, preferably with a novel anticoagulant, should atrial fibrillation be detected again on his pacemaker. He is at high risk of cardioembolic events due to multiple comorbidities of hypertension, diabetes mellitus, congestive heart failure (CHADSVasc 4).  Coronary artery disease Currently asymptomatic. Coronary angiography in 2005 showed moderate LAD and circumflex obstruction  Cardiomyopathy, hypertensive No clinical evidence of hypervolemia. Functional class is not assessed due to orthopedic problems  CKD (chronic kidney disease) stage 3, GFR 30-59 ml/min Recheck creatinine later this week to ensure its stability. May still benefit from ACE inhibitor therapy to prevent progression of diabetic nephropathy. Consider nephrology consultation.  HYPERTENSION Target BP less than 130/80 mm Hg. Restart ACE inhibitor if renal function allows   Orders Placed  This Encounter  Procedures  . Basic Metabolic Panel (BMET)  . TSH  . EKG 12-Lead   No orders of the defined types were placed in this encounter.    Holli Humbles, MD, [facility] [phone] office [phone] pager

## 2013-09-11 NOTE — Assessment & Plan Note (Addendum)
Quite unexpectedly, he has gone from persistent on interrupted atrial fibrillation that lasted for several months to complete abolition of the arrhythmia over the last 3 months. Continue amiodarone, with periodic reevaluation of liver tests and thyroid function tests. It is highly likely that atrial fibrillation will recur in the future. He is currently receiving only aspirin, rather than full dose anticoagulants, secondary to previous rectal bleeding. I would not hesitate to restart full dose anticoagulations, preferably with a novel anticoagulant, should atrial fibrillation be detected again on his pacemaker. He is at high risk of cardioembolic events due to multiple comorbidities of hypertension, diabetes mellitus, congestive heart failure (CHADSVasc 4).

## 2013-09-12 LAB — CULTURE, BLOOD (ROUTINE X 2)
CULTURE: NO GROWTH
Culture: NO GROWTH

## 2013-09-20 LAB — MDC_IDC_ENUM_SESS_TYPE_INCLINIC
Battery Impedance: 100 Ohm
Battery Remaining Longevity: 11.5
Battery Voltage: 2.79 V
Brady Statistic AP VP Percent: 3 %
Brady Statistic AP VS Percent: 78 %
Brady Statistic AS VP Percent: 4.9 %
Brady Statistic AS VS Percent: 14.2 %
Lead Channel Impedance Value: 419 Ohm
Lead Channel Impedance Value: 735 Ohm
Lead Channel Pacing Threshold Amplitude: 0.5 V
Lead Channel Pacing Threshold Amplitude: 1 V
Lead Channel Pacing Threshold Pulse Width: 0.4 ms
Lead Channel Pacing Threshold Pulse Width: 1 ms
Lead Channel Sensing Intrinsic Amplitude: 15.68 mV
Lead Channel Sensing Intrinsic Amplitude: 2.8 mV
Lead Channel Setting Pacing Amplitude: 1.5 V
Lead Channel Setting Pacing Amplitude: 2.5 V
Lead Channel Setting Pacing Pulse Width: 0.64 ms
Lead Channel Setting Sensing Sensitivity: 5.6 mV

## 2013-10-01 ENCOUNTER — Inpatient Hospital Stay (HOSPITAL_COMMUNITY)
Admission: EM | Admit: 2013-10-01 | Discharge: 2013-10-16 | DRG: 004 | Disposition: A | Discharge status: Other Acute Inpatient Hospital | Payer: Medicare Other | Attending: Neurology | Admitting: Neurology

## 2013-10-01 ENCOUNTER — Emergency Department (HOSPITAL_COMMUNITY): Payer: Medicare Other

## 2013-10-01 ENCOUNTER — Inpatient Hospital Stay (HOSPITAL_COMMUNITY): Payer: Medicare Other

## 2013-10-01 DIAGNOSIS — I615 Nontraumatic intracerebral hemorrhage, intraventricular: Secondary | ICD-10-CM

## 2013-10-01 DIAGNOSIS — G9340 Encephalopathy, unspecified: Secondary | ICD-10-CM | POA: Diagnosis present

## 2013-10-01 DIAGNOSIS — I5032 Chronic diastolic (congestive) heart failure: Secondary | ICD-10-CM | POA: Diagnosis present

## 2013-10-01 DIAGNOSIS — Z8679 Personal history of other diseases of the circulatory system: Secondary | ICD-10-CM

## 2013-10-01 DIAGNOSIS — E876 Hypokalemia: Secondary | ICD-10-CM | POA: Diagnosis present

## 2013-10-01 DIAGNOSIS — E46 Unspecified protein-calorie malnutrition: Secondary | ICD-10-CM | POA: Diagnosis present

## 2013-10-01 DIAGNOSIS — D649 Anemia, unspecified: Secondary | ICD-10-CM | POA: Diagnosis present

## 2013-10-01 DIAGNOSIS — E872 Acidosis, unspecified: Secondary | ICD-10-CM | POA: Diagnosis present

## 2013-10-01 DIAGNOSIS — I43 Cardiomyopathy in diseases classified elsewhere: Secondary | ICD-10-CM

## 2013-10-01 DIAGNOSIS — N179 Acute kidney failure, unspecified: Secondary | ICD-10-CM | POA: Diagnosis not present

## 2013-10-01 DIAGNOSIS — Z4682 Encounter for fitting and adjustment of non-vascular catheter: Secondary | ICD-10-CM | POA: Diagnosis not present

## 2013-10-01 DIAGNOSIS — J9819 Other pulmonary collapse: Secondary | ICD-10-CM | POA: Diagnosis not present

## 2013-10-01 DIAGNOSIS — R1310 Dysphagia, unspecified: Secondary | ICD-10-CM | POA: Diagnosis present

## 2013-10-01 DIAGNOSIS — J690 Pneumonitis due to inhalation of food and vomit: Secondary | ICD-10-CM | POA: Diagnosis present

## 2013-10-01 DIAGNOSIS — Z87891 Personal history of nicotine dependence: Secondary | ICD-10-CM | POA: Diagnosis not present

## 2013-10-01 DIAGNOSIS — I359 Nonrheumatic aortic valve disorder, unspecified: Secondary | ICD-10-CM | POA: Diagnosis present

## 2013-10-01 DIAGNOSIS — N183 Chronic kidney disease, stage 3 unspecified: Secondary | ICD-10-CM | POA: Diagnosis present

## 2013-10-01 DIAGNOSIS — J810 Acute pulmonary edema: Secondary | ICD-10-CM

## 2013-10-01 DIAGNOSIS — S98919A Complete traumatic amputation of unspecified foot, level unspecified, initial encounter: Secondary | ICD-10-CM | POA: Diagnosis not present

## 2013-10-01 DIAGNOSIS — Z950 Presence of cardiac pacemaker: Secondary | ICD-10-CM | POA: Diagnosis not present

## 2013-10-01 DIAGNOSIS — K560 Paralytic ileus: Secondary | ICD-10-CM | POA: Diagnosis not present

## 2013-10-01 DIAGNOSIS — K219 Gastro-esophageal reflux disease without esophagitis: Secondary | ICD-10-CM | POA: Diagnosis present

## 2013-10-01 DIAGNOSIS — R143 Flatulence: Secondary | ICD-10-CM | POA: Diagnosis not present

## 2013-10-01 DIAGNOSIS — I119 Hypertensive heart disease without heart failure: Secondary | ICD-10-CM

## 2013-10-01 DIAGNOSIS — Z6837 Body mass index (BMI) 37.0-37.9, adult: Secondary | ICD-10-CM | POA: Diagnosis not present

## 2013-10-01 DIAGNOSIS — I619 Nontraumatic intracerebral hemorrhage, unspecified: Principal | ICD-10-CM | POA: Diagnosis present

## 2013-10-01 DIAGNOSIS — E870 Hyperosmolality and hypernatremia: Secondary | ICD-10-CM | POA: Diagnosis not present

## 2013-10-01 DIAGNOSIS — B009 Herpesviral infection, unspecified: Secondary | ICD-10-CM | POA: Diagnosis present

## 2013-10-01 DIAGNOSIS — I428 Other cardiomyopathies: Secondary | ICD-10-CM | POA: Diagnosis present

## 2013-10-01 DIAGNOSIS — E785 Hyperlipidemia, unspecified: Secondary | ICD-10-CM | POA: Diagnosis present

## 2013-10-01 DIAGNOSIS — Z794 Long term (current) use of insulin: Secondary | ICD-10-CM | POA: Diagnosis not present

## 2013-10-01 DIAGNOSIS — M109 Gout, unspecified: Secondary | ICD-10-CM | POA: Diagnosis present

## 2013-10-01 DIAGNOSIS — I10 Essential (primary) hypertension: Secondary | ICD-10-CM

## 2013-10-01 DIAGNOSIS — N400 Enlarged prostate without lower urinary tract symptoms: Secondary | ICD-10-CM | POA: Diagnosis present

## 2013-10-01 DIAGNOSIS — I2699 Other pulmonary embolism without acute cor pulmonale: Secondary | ICD-10-CM | POA: Diagnosis present

## 2013-10-01 DIAGNOSIS — I369 Nonrheumatic tricuspid valve disorder, unspecified: Secondary | ICD-10-CM | POA: Diagnosis not present

## 2013-10-01 DIAGNOSIS — N189 Chronic kidney disease, unspecified: Secondary | ICD-10-CM

## 2013-10-01 DIAGNOSIS — J449 Chronic obstructive pulmonary disease, unspecified: Secondary | ICD-10-CM

## 2013-10-01 DIAGNOSIS — H548 Legal blindness, as defined in USA: Secondary | ICD-10-CM | POA: Diagnosis present

## 2013-10-01 DIAGNOSIS — I5189 Other ill-defined heart diseases: Secondary | ICD-10-CM | POA: Diagnosis present

## 2013-10-01 DIAGNOSIS — I6350 Cerebral infarction due to unspecified occlusion or stenosis of unspecified cerebral artery: Secondary | ICD-10-CM | POA: Diagnosis not present

## 2013-10-01 DIAGNOSIS — J90 Pleural effusion, not elsewhere classified: Secondary | ICD-10-CM | POA: Diagnosis not present

## 2013-10-01 DIAGNOSIS — R141 Gas pain: Secondary | ICD-10-CM | POA: Diagnosis not present

## 2013-10-01 DIAGNOSIS — E1149 Type 2 diabetes mellitus with other diabetic neurological complication: Secondary | ICD-10-CM | POA: Diagnosis present

## 2013-10-01 DIAGNOSIS — Z7982 Long term (current) use of aspirin: Secondary | ICD-10-CM

## 2013-10-01 DIAGNOSIS — E1142 Type 2 diabetes mellitus with diabetic polyneuropathy: Secondary | ICD-10-CM | POA: Diagnosis present

## 2013-10-01 DIAGNOSIS — I2510 Atherosclerotic heart disease of native coronary artery without angina pectoris: Secondary | ICD-10-CM | POA: Diagnosis present

## 2013-10-01 DIAGNOSIS — R4701 Aphasia: Secondary | ICD-10-CM | POA: Diagnosis present

## 2013-10-01 DIAGNOSIS — Z8249 Family history of ischemic heart disease and other diseases of the circulatory system: Secondary | ICD-10-CM

## 2013-10-01 DIAGNOSIS — I480 Paroxysmal atrial fibrillation: Secondary | ICD-10-CM | POA: Diagnosis present

## 2013-10-01 DIAGNOSIS — I6789 Other cerebrovascular disease: Secondary | ICD-10-CM | POA: Diagnosis not present

## 2013-10-01 DIAGNOSIS — I4891 Unspecified atrial fibrillation: Secondary | ICD-10-CM

## 2013-10-01 DIAGNOSIS — Z930 Tracheostomy status: Secondary | ICD-10-CM | POA: Diagnosis not present

## 2013-10-01 DIAGNOSIS — J9600 Acute respiratory failure, unspecified whether with hypoxia or hypercapnia: Secondary | ICD-10-CM | POA: Diagnosis present

## 2013-10-01 DIAGNOSIS — I129 Hypertensive chronic kidney disease with stage 1 through stage 4 chronic kidney disease, or unspecified chronic kidney disease: Secondary | ICD-10-CM | POA: Diagnosis present

## 2013-10-01 DIAGNOSIS — I513 Intracardiac thrombosis, not elsewhere classified: Secondary | ICD-10-CM

## 2013-10-01 DIAGNOSIS — G4733 Obstructive sleep apnea (adult) (pediatric): Secondary | ICD-10-CM | POA: Diagnosis present

## 2013-10-01 DIAGNOSIS — R6889 Other general symptoms and signs: Secondary | ICD-10-CM | POA: Diagnosis not present

## 2013-10-01 DIAGNOSIS — I639 Cerebral infarction, unspecified: Secondary | ICD-10-CM | POA: Diagnosis present

## 2013-10-01 LAB — DIFFERENTIAL
BASOS ABS: 0 10*3/uL (ref 0.0–0.1)
Basophils Relative: 0 % (ref 0–1)
Eosinophils Absolute: 0.3 10*3/uL (ref 0.0–0.7)
Eosinophils Relative: 4 % (ref 0–5)
LYMPHS PCT: 30 % (ref 12–46)
Lymphs Abs: 2.4 10*3/uL (ref 0.7–4.0)
Monocytes Absolute: 0.5 10*3/uL (ref 0.1–1.0)
Monocytes Relative: 7 % (ref 3–12)
NEUTROS PCT: 60 % (ref 43–77)
Neutro Abs: 4.8 10*3/uL (ref 1.7–7.7)

## 2013-10-01 LAB — COMPREHENSIVE METABOLIC PANEL
ALT: 77 U/L — AB (ref 0–53)
AST: 51 U/L — AB (ref 0–37)
Albumin: 2.6 g/dL — ABNORMAL LOW (ref 3.5–5.2)
Alkaline Phosphatase: 143 U/L — ABNORMAL HIGH (ref 39–117)
BUN: 11 mg/dL (ref 6–23)
CHLORIDE: 100 meq/L (ref 96–112)
CO2: 25 meq/L (ref 19–32)
Calcium: 9 mg/dL (ref 8.4–10.5)
Creatinine, Ser: 1.1 mg/dL (ref 0.50–1.35)
GFR calc Af Amer: 79 mL/min — ABNORMAL LOW (ref >90)
GFR, EST NON AFRICAN AMERICAN: 69 mL/min — AB (ref >90)
Glucose, Bld: 185 mg/dL — ABNORMAL HIGH (ref 70–99)
POTASSIUM: 4 meq/L (ref 3.7–5.3)
SODIUM: 137 meq/L (ref 137–147)
Total Bilirubin: 0.2 mg/dL — ABNORMAL LOW (ref 0.3–1.2)
Total Protein: 7.3 g/dL (ref 6.0–8.3)

## 2013-10-01 LAB — RAPID URINE DRUG SCREEN, HOSP PERFORMED
Amphetamines: NOT DETECTED
BARBITURATES: NOT DETECTED
Benzodiazepines: NOT DETECTED
COCAINE: NOT DETECTED
Opiates: NOT DETECTED
TETRAHYDROCANNABINOL: NOT DETECTED

## 2013-10-01 LAB — TROPONIN I: Troponin I: <0.3 ng/mL (ref <0.30)

## 2013-10-01 LAB — POCT I-STAT, CHEM 8
BUN: 9 mg/dL (ref 6–23)
CALCIUM ION: 1.24 mmol/L (ref 1.13–1.30)
CREATININE: 1.2 mg/dL (ref 0.50–1.35)
Chloride: 100 mEq/L (ref 96–112)
GLUCOSE: 186 mg/dL — AB (ref 70–99)
HCT: 38 % — ABNORMAL LOW (ref 39.0–52.0)
HEMOGLOBIN: 12.9 g/dL — AB (ref 13.0–17.0)
Potassium: 3.8 mEq/L (ref 3.7–5.3)
Sodium: 138 mEq/L (ref 137–147)
TCO2: 24 mmol/L (ref 0–100)

## 2013-10-01 LAB — URINALYSIS, ROUTINE W REFLEX MICROSCOPIC
BILIRUBIN URINE: NEGATIVE
Glucose, UA: NEGATIVE mg/dL
Hgb urine dipstick: NEGATIVE
Ketones, ur: NEGATIVE mg/dL
LEUKOCYTES UA: NEGATIVE
NITRITE: NEGATIVE
Protein, ur: NEGATIVE mg/dL
Specific Gravity, Urine: 1.013 (ref 1.005–1.030)
UROBILINOGEN UA: 0.2 mg/dL (ref 0.0–1.0)
pH: 7.5 (ref 5.0–8.0)

## 2013-10-01 LAB — GLUCOSE, CAPILLARY
GLUCOSE-CAPILLARY: 155 mg/dL — AB (ref 70–99)
GLUCOSE-CAPILLARY: 122 mg/dL — AB (ref 70–99)

## 2013-10-01 LAB — CBC
HCT: 34.8 % — ABNORMAL LOW (ref 39.0–52.0)
Hemoglobin: 11.2 g/dL — ABNORMAL LOW (ref 13.0–17.0)
MCH: 26 pg (ref 26.0–34.0)
MCHC: 32.2 g/dL (ref 30.0–36.0)
MCV: 80.9 fL (ref 78.0–100.0)
PLATELETS: 220 10*3/uL (ref 150–400)
RBC: 4.3 MIL/uL (ref 4.22–5.81)
RDW: 15.5 % (ref 11.5–15.5)
WBC: 8 10*3/uL (ref 4.0–10.5)

## 2013-10-01 LAB — ETHANOL

## 2013-10-01 LAB — PROTIME-INR
INR: 1.1 (ref 0.00–1.49)
PROTHROMBIN TIME: 14 s (ref 11.6–15.2)

## 2013-10-01 LAB — POCT I-STAT TROPONIN I: Troponin i, poc: 0.02 ng/mL (ref 0.00–0.08)

## 2013-10-01 LAB — APTT: aPTT: 32 seconds (ref 24–37)

## 2013-10-01 LAB — MRSA PCR SCREENING: MRSA BY PCR: NEGATIVE

## 2013-10-01 MED ORDER — CARVEDILOL 25 MG PO TABS
25.0000 mg | ORAL_TABLET | Freq: Two times a day (BID) | ORAL | Status: DC
  Filled 2013-10-01 (×4): qty 1

## 2013-10-01 MED ORDER — POLYVINYL ALCOHOL 1.4 % OP SOLN
1.0000 [drp] | Freq: Two times a day (BID) | OPHTHALMIC | Status: DC
  Administered 2013-10-01 – 2013-10-16 (×30): 1 [drp] via OPHTHALMIC
  Filled 2013-10-01: qty 15

## 2013-10-01 MED ORDER — DORZOLAMIDE HCL-TIMOLOL MAL 2-0.5 % OP SOLN
1.0000 [drp] | Freq: Two times a day (BID) | OPHTHALMIC | Status: DC
  Administered 2013-10-01 – 2013-10-16 (×30): 1 [drp] via OPHTHALMIC
  Filled 2013-10-01: qty 10

## 2013-10-01 MED ORDER — ACETAMINOPHEN 650 MG RE SUPP: 650.0000 mg | RECTAL | Status: DC | PRN

## 2013-10-01 MED ORDER — OMEPRAZOLE MAGNESIUM 20 MG PO TBEC: 20.0000 mg | DELAYED_RELEASE_TABLET | Freq: Every evening | ORAL | Status: DC

## 2013-10-01 MED ORDER — SENNOSIDES-DOCUSATE SODIUM 8.6-50 MG PO TABS
1.0000 | ORAL_TABLET | Freq: Two times a day (BID) | ORAL | Status: DC
  Administered 2013-10-02 – 2013-10-13 (×11): 1 via ORAL
  Filled 2013-10-01 (×31): qty 1

## 2013-10-01 MED ORDER — MAGNESIUM OXIDE 400 (241.3 MG) MG PO TABS
400.0000 mg | ORAL_TABLET | Freq: Every day | ORAL | Status: DC
  Administered 2013-10-02 – 2013-10-16 (×13): 400 mg via ORAL
  Filled 2013-10-01 (×16): qty 1

## 2013-10-01 MED ORDER — BRIMONIDINE TARTRATE 0.2 % OP SOLN
1.0000 [drp] | Freq: Three times a day (TID) | OPHTHALMIC | Status: DC
  Administered 2013-10-01 – 2013-10-16 (×45): 1 [drp] via OPHTHALMIC
  Filled 2013-10-01: qty 5

## 2013-10-01 MED ORDER — NICARDIPINE HCL IN NACL 20-0.86 MG/200ML-% IV SOLN
3.0000 mg/h | INTRAVENOUS | Status: DC
  Administered 2013-10-01: 5 mg/h via INTRAVENOUS
  Administered 2013-10-01 (×2): 10 mg/h via INTRAVENOUS
  Administered 2013-10-02: 15 mg/h via INTRAVENOUS
  Filled 2013-10-01 (×3): qty 200
  Filled 2013-10-01: qty 400
  Filled 2013-10-01 (×3): qty 200

## 2013-10-01 MED ORDER — PANTOPRAZOLE SODIUM 40 MG IV SOLR
40.0000 mg | Freq: Every day | INTRAVENOUS | Status: DC
  Administered 2013-10-02 – 2013-10-08 (×7): 40 mg via INTRAVENOUS
  Filled 2013-10-01 (×8): qty 40

## 2013-10-01 MED ORDER — POLYETHYLENE GLYCOL 3350 17 G PO PACK
17.0000 g | PACK | Freq: Every day | ORAL | Status: DC
  Administered 2013-10-02 – 2013-10-13 (×5): 17 g via ORAL
  Filled 2013-10-01 (×16): qty 1

## 2013-10-01 MED ORDER — ISOSORBIDE MONONITRATE ER 30 MG PO TB24
30.0000 mg | ORAL_TABLET | Freq: Every day | ORAL | Status: DC
  Filled 2013-10-01 (×2): qty 1

## 2013-10-01 MED ORDER — CLONIDINE HCL 0.1 MG PO TABS
0.1000 mg | ORAL_TABLET | Freq: Two times a day (BID) | ORAL | Status: DC
  Filled 2013-10-01 (×3): qty 1

## 2013-10-01 MED ORDER — LABETALOL HCL 5 MG/ML IV SOLN
10.0000 mg | Freq: Once | INTRAVENOUS | Status: AC
  Administered 2013-10-01: 10 mg via INTRAVENOUS
  Filled 2013-10-01: qty 4

## 2013-10-01 MED ORDER — ATORVASTATIN CALCIUM 10 MG PO TABS
10.0000 mg | ORAL_TABLET | Freq: Every day | ORAL | Status: DC
  Administered 2013-10-02 – 2013-10-15 (×13): 10 mg via ORAL
  Filled 2013-10-01 (×16): qty 1

## 2013-10-01 MED ORDER — ACETAMINOPHEN 325 MG PO TABS
650.0000 mg | ORAL_TABLET | ORAL | Status: DC | PRN
Start: 2013-10-01 — End: 2013-10-09

## 2013-10-01 MED ORDER — LABETALOL HCL 5 MG/ML IV SOLN: 20.0000 mg | Freq: Once | INTRAVENOUS | Status: DC

## 2013-10-01 MED ORDER — COLCHICINE 0.6 MG PO TABS
0.6000 mg | ORAL_TABLET | Freq: Two times a day (BID) | ORAL | Status: DC | PRN
  Filled 2013-10-01: qty 1

## 2013-10-01 MED ORDER — BRIMONIDINE TARTRATE 0.15 % OP SOLN
1.0000 [drp] | Freq: Three times a day (TID) | OPHTHALMIC | Status: DC
  Filled 2013-10-01: qty 5

## 2013-10-01 MED ORDER — INSULIN ASPART 100 UNIT/ML ~~LOC~~ SOLN: 0.0000 [IU] | Freq: Three times a day (TID) | SUBCUTANEOUS | Status: DC

## 2013-10-01 MED ORDER — AMIODARONE HCL 200 MG PO TABS
200.0000 mg | ORAL_TABLET | Freq: Every day | ORAL | Status: DC
  Administered 2013-10-02 – 2013-10-16 (×13): 200 mg via ORAL
  Filled 2013-10-01 (×16): qty 1

## 2013-10-01 NOTE — ED Notes (Signed)
Dr. Reynolds at bedside.

## 2013-10-01 NOTE — ED Notes (Signed)
Yellow patient valuables envelope sheet placed in patient chart with key attached from security for lock box.

## 2013-10-01 NOTE — ED Notes (Signed)
Per Dr. Doy Mince, [place] to hold by mouth medications due to stroke swallow.

## 2013-10-01 NOTE — Progress Notes (Addendum)
Admission History and Physical  Referring Physician: Rancour    Chief Complaint: Altered speech  HPI: [name] is an 68 y.o. male who lives with a roommate.  Roommate felt that patient was not at his baseline today and this morning felt hat he was hallucinating.  He went to his day program where they noticed that he was not talking right.  EMS was called at that time.    Date last known well: Date: 09/30/2013 Time last known well: Time: 22:30 tPA Given: No: Outside time window  Past Medical History  Diagnosis Date  . Diabetes type 2, uncontrolled   . Gout   . Hyperlipidemia   . Hypertension   . Obstructive sleep apnea     On CPAP. Last sleep study (06/2009) - Severe obstructive sleep apnea/hypopnea syndrome, apnea-hypopnea index 70.5 per hour with non positional events moderately loud snoring and oxygen desaturation to a nadir of 85% on room air.  . Benign prostatic hypertrophy   . Morbid obesity   . Cardiomyopathy, hypertensive      diastolic dysfunction by [id number] echo  . Coronary artery disease      nonobstructive. 2 vessel. cardiac catheter in 11/24/2003 showing 60% LAD occlusion, 30% circumflex occlusion.  . 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 Legally blind   . Herpes   . Occult blood positive stool      History of in August 2007.  colonoscopy in January 2008 showing normal colon with fair inadequate prep. By Dr. Silvano Rusk.  . Incarcerated ventral hernia     H/O. S/P repair ventral hernia repair 07/2005.  . Diabetic ulcer of thigh     H/O Rt thigh ulcer.  . Diabetic peripheral neuropathy   . Exertional shortness of breath     "comes and goes" (09-Jul-2013)  . GERD (gastroesophageal reflux disease)   . Degenerative joint disease      Right knee.  . Arthritis     "all over" (09-Jul-2013)  . Sepsis 08/26/2013  . Atrial fibrillation     not Candidate for Coumadin d/t rectal bleeding  . Hypotension 09/05/2013    Past Surgical History  Procedure  Laterality Date  . Pacemaker lead revision  12/2003     likely microdislodgment  of right ventricular lead  . Ventral hernia repair  07/2005     S/P laparotomy, lysis of adhesions, repair of incarcerated ventral hernia with  partial omentectomy.. Performed by Dr. Marlou Starks.  . Hernia repair    . Cataract extraction, bilateral Bilateral   . Refractive surgery Bilateral   . Eye surgery    . 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 Amputation Left 06/22/2013    Procedure: AMPUTATION FOOT;  Surgeon: Newt Minion, MD;  Location: [place];  Service: Orthopedics;  Laterality: Left;  Left Midfoot Amputation  . Insert / replace / remove pacemaker  24-Nov-2003    Secondary to symptomatic bradycardia. DDDR pacemaker. By Dr. Rollene Fare.  . Insert / replace / remove pacemaker  Nov 23, 2012    "old pacemaker died; had new one put in" (07-09-2013)    Family History  Problem Relation Age of Onset  . Coronary artery disease Sister   . Heart disease Sister   . Allergies Sister   . Asthma Brother   . Rheum arthritis Brother    Social History:  reports that he quit smoking about 10 years ago. His smoking use included Cigarettes. He smoked 0.00 packs  per day for 0 years. He has never used smokeless tobacco. He reports that he drinks alcohol. He reports that he does not use illicit drugs.  Allergies: No Known Allergies  Medications: I have reviewed the patient's current medications. Prior to Admission:  Current outpatient prescriptions:amiodarone (PACERONE) 200 MG tablet, Take 200 mg by mouth daily., Disp: , Rfl: ;  aspirin 81 MG chewable tablet, Chew 81 mg by mouth daily., Disp: , Rfl: ;  brimonidine (ALPHAGAN P) 0.1 % SOLN, Place 1 drop into both eyes every 8 (eight) hours., Disp: , Rfl: ;  carvedilol (COREG) 25 MG tablet, Take 25 mg by mouth 2 (two) times daily with a meal. , Disp: , Rfl:  cloNIDine (CATAPRES) 0.2 MG tablet, Take 0.5 tablets (0.1 mg total) by mouth 2 (two) times  daily., Disp: 60 tablet, Rfl: 1;  colchicine 0.6 MG tablet, Take 0.6 mg by mouth 2 (two) times daily as needed (for gout flare-ups). , Disp: , Rfl: ;  dorzolamide-timolol (COSOPT) 22.3-6.8 MG/ML ophthalmic solution, Place 1 drop into both eyes 2 (two) times daily., Disp: , Rfl:  HYDROcodone-acetaminophen (NORCO/VICODIN) 5-325 MG per tablet, Take 1-2 tablets by mouth every 6 (six) hours as needed for moderate pain., Disp: , Rfl: ;  methylcellulose (ARTIFICIAL TEARS) 1 % ophthalmic solution, Place 1 drop into both eyes 2 (two) times daily., Disp: , Rfl: ;  rosuvastatin (CRESTOR) 40 MG tablet, Take 40 mg by mouth every evening., Disp: , Rfl:  albuterol (PROVENTIL HFA;VENTOLIN HFA) 108 (90 BASE) MCG/ACT inhaler, Inhale 2 puffs into the lungs every 6 (six) hours as needed for wheezing., Disp: , Rfl: ;  carbamazepine (TEGRETOL) 200 MG tablet, Take 200 mg by mouth 2 (two) times daily., Disp: , Rfl: ;  cholecalciferol (VITAMIN D) 1000 UNITS tablet, Take 1,000 Units by mouth at bedtime., Disp: , Rfl: ;  febuxostat (ULORIC) 40 MG tablet, Take 40 mg by mouth at bedtime., Disp: , Rfl:  glucose blood test strip, 1 each by Other route as needed. Use as instructed to check blood sugar before meals and at bedtime at least 2-3 time daily, Disp: , Rfl: ;  insulin glargine (LANTUS) 100 UNIT/ML injection, Inject 0.08 mLs (8 Units total) into the skin at bedtime., Disp: 10 mL, Rfl: 11;  Insulin Pen Needle 31G X 8 MM MISC, Use to inject insulin 2x daily before breakfast and dinner., Disp: , Rfl:  isosorbide mononitrate (IMDUR) 30 MG 24 hr tablet, Take 30 mg by mouth daily., Disp: , Rfl: ;  magnesium oxide (MAG-OX) 400 MG tablet, Take 400 mg by mouth daily., Disp: , Rfl: ;  omeprazole (PRILOSEC OTC) 20 MG tablet, Take 20 mg by mouth every evening. , Disp: , Rfl: ;  polyethylene glycol (MIRALAX / GLYCOLAX) packet, Take 17 g by mouth daily. , Disp: , Rfl:  potassium chloride SA (K-DUR,KLOR-CON) 20 MEQ tablet, Take 20 mEq by mouth 2  (two) times daily. , Disp: , Rfl: ;  sennosides-docusate sodium (SENOKOT-S) 8.6-50 MG tablet, Take 2 tablets by mouth daily., Disp: , Rfl:   ROS: Unable to obtain secondary to mental status  Physical Examination: Blood pressure 174/107, pulse 71, temperature 97.9 F (36.6 C), temperature source Oral, resp. rate 26, SpO2 96.00%.  Neurologic Examination: Mental Status: Alert.  When asked the month he reports that it is "2".  Unable to tell me his age.  Nonfluent.  Speech often nonsensible  Unable to follow commands.  Cranial Nerves: II: Discs flat bilaterally; Blind. Pupils 5-34mm and unreactive III,IV,  VI: ptosis not present, extra-ocular motions intact bilaterally V,VII: decreased right NLF, facial light touch sensation normal bilaterally VIII: hearing normal bilaterally IX,X: gag reflex present XI: bilateral shoulder shrug XII: midline tongue extension Motor: Able to lift both upper extremities and maintain off the bed.  Able to lift both legs of the bed but will not maintain and complains of pain Sensory: Pinprick and light touch intact throughout, bilaterally Deep Tendon Reflexes: 1+ in the upper extremities and absent in the lower extremities Plantars: In foot splints bilaterally Cerebellar: Unable to perform Gait: Unable to test CV: pulses palpable throughout. Single S1, S2  Lung: CTA Abd: soft and nontender  Laboratory Studies:  Basic Metabolic Panel:  Recent Labs Lab 10/01/13 1519  NA 138  K 3.8  CL 100  GLUCOSE 186*  BUN 9  CREATININE 1.20    Liver Function Tests: No results found for this basename: AST, ALT, ALKPHOS, BILITOT, PROT, ALBUMIN,  in the last 168 hours No results found for this basename: LIPASE, AMYLASE,  in the last 168 hours No results found for this basename: AMMONIA,  in the last 168 hours  CBC:  Recent Labs Lab 10/01/13 1504 10/01/13 1519  WBC 8.0  --   NEUTROABS 4.8  --   HGB 11.2* 12.9*  HCT 34.8* 38.0*  MCV 80.9  --   PLT 220   --     Cardiac Enzymes:  Recent Labs Lab 10/01/13 1504  TROPONINI <0.30    BNP: No components found with this basename: POCBNP,   CBG:  Recent Labs Lab 10/01/13 [place]*    Microbiology: Results for orders placed during the hospital encounter of 09/05/13  CULTURE, BLOOD (ROUTINE X 2)     Status: None   Collection Time    09/05/13 11:15 PM      Result Value Ref Range Status   Specimen Description BLOOD RIGHT ARM   Final   Special Requests BOTTLES DRAWN AEROBIC ONLY 5CC   Final   Culture  Setup Time     Final   Value: 09/06/2013 04:00     Performed at [place]     Final   Value: NO GROWTH 5 DAYS     Performed at [facility]   Report Status 09/12/2013 FINAL   Final  CULTURE, BLOOD (ROUTINE X 2)     Status: None   Collection Time    09/05/13 11:20 PM      Result Value Ref Range Status   Specimen Description BLOOD RIGHT HAND   Final   Special Requests BOTTLES DRAWN AEROBIC ONLY 4CC   Final   Culture  Setup Time     Final   Value: 09/06/2013 04:00     Performed at [place]     Final   Value: NO GROWTH 5 DAYS     Performed at [facility]   Report Status 09/12/2013 FINAL   Final  URINE CULTURE     Status: None   Collection Time    09/06/13  6:07 AM      Result Value Ref Range Status   Specimen Description URINE, RANDOM   Final   Special Requests NONE   Final   Culture  Setup Time     Final   Value: 09/06/2013 15:22     Performed at [place]     Final   Value: NO GROWTH  Performed at [facility]     Final   Value: NO GROWTH     Performed at [facility]   Report Status 09/07/2013 FINAL   Final    Coagulation Studies:  Recent Labs  10/01/13 1504  LABPROT 14.0  INR 1.10    Urinalysis: No results found for this basename: COLORURINE, APPERANCEUR, LABSPEC, PHURINE, GLUCOSEU, HGBUR, BILIRUBINUR, KETONESUR, PROTEINUR,  UROBILINOGEN, NITRITE, LEUKOCYTESUR,  in the last 168 hours  Lipid Panel:    Component Value Date/Time   CHOL 171 06/29/2010   TRIG 107 06/29/2010   HDL 32 06/29/2010   CHOLHDL 6.1 Ratio 05/15/2009 2055   VLDL 44* 05/15/2009 2055   LDLCALC 118 06/29/2010    HgbA1C:  Lab Results  Component Value Date   HGBA1C 8.2* 08/28/2013    Urine Drug Screen:     Component Value Date/Time   LABOPIA NEG 12/16/2008 2246   COCAINSCRNUR NEG 12/16/2008 2246   LABBENZ NEG 12/16/2008 2246   AMPHETMU NEG 12/16/2008 2246    Alcohol Level: No results found for this basename: ETH,  in the last 168 hours  Other results: EKG: sinus rhythm at 74 bpm.  Imaging: Ct Head Wo Contrast  10/01/2013   CLINICAL DATA:  Aphasic  EXAM: CT HEAD WITHOUT CONTRAST  TECHNIQUE: Contiguous axial images were obtained from the base of the skull through the vertex without intravenous contrast.  COMPARISON:  None.  FINDINGS: Skull and Sinuses:There is patchy mucosal thickening in the bilateral paranasal sinuses, worse in the ethmoid and frontal sinuses.  Orbits: Bilateral cataract resection.  Brain: 4 x 2.8 cm parenchymal hematoma in the left temporoparietal cortex and subcortical white matter. There is a tiny calcification or separate hemorrhage in the more deep white matter. There is surrounding edema with local mass effect; no midline shift or herniation. In this location, hemorrhagic infarct or amyloid angiopathy are the most likely causes.  Age advanced chronic small vessel ischemic injury with diffuse cerebral white matter low-attenuation. No hydrocephalus. Generalized cerebral volume loss.  Critical Value/emergent results were called by telephone at the time of interpretation on 10/01/2013 at 3:54 PM to Dr. Ezequiel Essex , who verbally acknowledged these results.  IMPRESSION: 1. 4 x 3 cm left temporal/parietal parenchymal hematoma. 2. 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 3. Diffuse inflammatory paranasal sinus  disease.   Electronically Signed   By: Jorje Guild M.D.   On: 10/01/2013 15:59    Assessment: 68 y.o. male presenting with expressive and receptive aphasia.  Has multiple stroke risk factors.  Can not rule out a small embolic event.  Patient on ASA daily for stroke prophylaxis.  Not a tPA candidate due to being outside of the stroke window.  Further work up recommended.    Stroke Risk Factors - atrial fibrillation, diabetes mellitus, hyperlipidemia and hypertension  Plan: 1. HgbA1c, fasting lipid panel 2. Head CT without contrast to rule out ICH.  BP elevated.  If unremarkable should have MRI, MRA  of the brain without contrast 3. PT consult, OT consult, Speech consult 4. Echocardiogram 5. Carotid dopplers 6. Prophylactic therapy-continue ASA daily 7. Risk factor modification 8. Telemetry monitoring 9. Frequent neuro checks  Case discussed with Dr. Malva Cogan, MD [facility] [phone] 10/01/2013, 4:10 PM  Addendum: Head CT reviewed and shows a left temporal parietal ICH with some surrounding edema.  Patient will require admission.  Plan: 1.  Admit to ICU 2.  BP control.  This patient  is critically ill and at significant risk of neurological worsening, death and care requires constant monitoring of vital signs, hemodynamics,respiratory and cardiac monitoring, neurological assessment, discussion with family, other specialists and medical decision making of high complexity. I spent 55 minutes of neurocritical care time  in the care of  this patient.  Alexis Goodell, MD [facility] [phone] 10/01/2013  4:12 PM   Alexis Goodell, MD [facility] [phone]

## 2013-10-01 NOTE — ED Notes (Signed)
Pt pants removed and placed in belonging bags. Pt is gripping a envelope in right hand and will not release.

## 2013-10-01 NOTE — ED Notes (Addendum)
Page Stroke page cancelled by EDP. Out of Code Stroke window. EDP at bedside.

## 2013-10-01 NOTE — ED Provider Notes (Signed)
CSN: [id number]     Arrival date & time 10/01/13  Nov 08, 1413 History   First MD Initiated Contact with Patient 10/01/13 1420     No chief complaint on file.    (Consider location/radiation/quality/duration/timing/severity/associated sxs/prior Treatment) HPI Comments: Patient arrives via EMS with change in mental status and difficulty speaking. He is jumbling his words and not responding appropriately. He was last seen normal 16 hours ago by report. No evidence of trauma. Patient denies any headache, chest pain or shortness of breath.  The history is provided by the patient and the EMS personnel. The history is limited by the condition of the patient.    Past Medical History  Diagnosis Date  . Diabetes type 2, uncontrolled   . Gout   . Hyperlipidemia   . Hypertension   . Obstructive sleep apnea     On CPAP. Last sleep study (06/2009) - Severe obstructive sleep apnea/hypopnea syndrome, apnea-hypopnea index 70.5 per hour with non positional events moderately loud snoring and oxygen desaturation to a nadir of 85% on room air.  . Benign prostatic hypertrophy   . Morbid obesity   . Cardiomyopathy, hypertensive      diastolic dysfunction by [id number] echo  . Coronary artery disease      nonobstructive. 2 vessel. cardiac catheter in November 09, 2003 showing 60% LAD occlusion, 30% circumflex occlusion.  . Pacemaker 2003-11-09     secondary to bradycardia  . Legally blind   . Herpes   . Occult blood positive stool      History of in August 2007.  colonoscopy in January 2008 showing normal colon with fair inadequate prep. By Dr. Silvano Rusk.  . Incarcerated ventral hernia     H/O. S/P repair ventral hernia repair 07/2005.  . Diabetic ulcer of thigh     H/O Rt thigh ulcer.  . Diabetic peripheral neuropathy   . Exertional shortness of breath     "comes and goes" (Jun 24, 2013)  . GERD (gastroesophageal reflux disease)   . Degenerative joint disease      Right knee.  . Arthritis     "all over" (06/24/2013)  .  Sepsis 08/26/2013  . Atrial fibrillation     not Candidate for Coumadin d/t rectal bleeding  . Hypotension 09/05/2013   Past Surgical History  Procedure Laterality Date  . Pacemaker lead revision  12/2003     likely microdislodgment  of right ventricular lead  . Ventral hernia repair  07/2005     S/P laparotomy, lysis of adhesions, repair of incarcerated ventral hernia with  partial omentectomy.. Performed by Dr. Marlou Starks.  . Hernia repair    . Cataract extraction, bilateral Bilateral   . Refractive surgery Bilateral   . Eye surgery    . 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 Amputation Left 06/22/2013    Procedure: AMPUTATION FOOT;  Surgeon: Newt Minion, MD;  Location: [place];  Service: Orthopedics;  Laterality: Left;  Left Midfoot Amputation  . Insert / replace / remove pacemaker  09-Nov-2003    Secondary to symptomatic bradycardia. DDDR pacemaker. By Dr. Rollene Fare.  . Insert / replace / remove pacemaker  11-08-2012    "old pacemaker died; had new one put in" (2013/06/24)   Family History  Problem Relation Age of Onset  . Coronary artery disease Sister   . Heart disease Sister   . Allergies Sister   . Asthma Brother   . Rheum arthritis Brother    History  Substance Use  Topics  . Smoking status: Former Smoker -- 0.00 packs/day for 0 years    Types: Cigarettes    Quit date: 08/17/2003  . Smokeless tobacco: Never Used     Comment: started at age 45.  only smoked on occ- very rarely.0  . Alcohol Use: Yes     Comment: 06/23/2013 "drank some; never had a problem w/it"    Review of Systems  Unable to perform ROS: Mental status change      Allergies  Review of patient's allergies indicates no known allergies.  Home Medications   Current Outpatient Rx  Name  Route  Sig  Dispense  Refill  . albuterol (PROVENTIL HFA;VENTOLIN HFA) 108 (90 BASE) MCG/ACT inhaler   Inhalation   Inhale 2 puffs into the lungs every 6 (six) hours as needed for  wheezing.         Marland Kitchen amiodarone (PACERONE) 200 MG tablet   Oral   Take 200 mg by mouth daily.         Marland Kitchen aspirin 81 MG chewable tablet   Oral   Chew 81 mg by mouth daily.         . brimonidine (ALPHAGAN P) 0.1 % SOLN   Both Eyes   Place 1 drop into both eyes every 8 (eight) hours.         . carvedilol (COREG) 25 MG tablet   Oral   Take 25 mg by mouth 2 (two) times daily with a meal.          . cloNIDine (CATAPRES) 0.2 MG tablet   Oral   Take 0.5 tablets (0.1 mg total) by mouth 2 (two) times daily.   60 tablet   1   . colchicine 0.6 MG tablet   Oral   Take 0.6 mg by mouth 2 (two) times daily as needed (for gout flare-ups).          . dorzolamide-timolol (COSOPT) 22.3-6.8 MG/ML ophthalmic solution   Both Eyes   Place 1 drop into both eyes 2 (two) times daily.         Marland Kitchen HYDROcodone-acetaminophen (NORCO/VICODIN) 5-325 MG per tablet   Oral   Take 1-2 tablets by mouth every 6 (six) hours as needed for moderate pain.         . isosorbide mononitrate (IMDUR) 30 MG 24 hr tablet   Oral   Take 30 mg by mouth daily.         . magnesium oxide (MAG-OX) 400 MG tablet   Oral   Take 400 mg by mouth daily.         . methylcellulose (ARTIFICIAL TEARS) 1 % ophthalmic solution   Both Eyes   Place 1 drop into both eyes 2 (two) times daily.         Marland Kitchen omeprazole (PRILOSEC OTC) 20 MG tablet   Oral   Take 20 mg by mouth every evening.          . polyethylene glycol (MIRALAX / GLYCOLAX) packet   Oral   Take 17 g by mouth daily.          . rosuvastatin (CRESTOR) 40 MG tablet   Oral   Take 40 mg by mouth every evening.         . sennosides-docusate sodium (SENOKOT-S) 8.6-50 MG tablet   Oral   Take 2 tablets by mouth daily.         . insulin glargine (LANTUS) 100 UNIT/ML injection   Subcutaneous   Inject 0.08  mLs (8 Units total) into the skin at bedtime.   10 mL   11    BP 172/101  Pulse 70  Temp(Src) 97.9 F (36.6 C) (Oral)  Resp 20  SpO2  98% Physical Exam  Constitutional: He appears well-developed and well-nourished. No distress.  HENT:  Head: Normocephalic and atraumatic.  Mouth/Throat: Oropharynx is clear and moist. No oropharyngeal exudate.  Eyes: Conjunctivae and EOM are normal.  Dilated pupils, blind at baseline  Neck: Normal range of motion. Neck supple.  Cardiovascular: Normal rate, regular rhythm and normal heart sounds.   Pulmonary/Chest: Effort normal and breath sounds normal. No respiratory distress.  Abdominal: Soft. There is no tenderness. There is no rebound and no guarding.  Musculoskeletal: Normal range of motion. He exhibits no edema and no tenderness.  Neurological: He is alert. No cranial nerve deficit.  Expressive aphasia Moving all extremities, follows commands intermittently Unable to cooperate with formal testing  Skin: Skin is warm.    ED Course  Procedures (including critical care time) Labs Review Labs Reviewed  CBC - Abnormal; Notable for the following:    Hemoglobin 11.2 (*)    HCT 34.8 (*)    All other components within normal limits  COMPREHENSIVE METABOLIC PANEL - Abnormal; Notable for the following:    Glucose, Bld 185 (*)    Albumin 2.6 (*)    AST 51 (*)    ALT 77 (*)    Alkaline Phosphatase 143 (*)    Total Bilirubin 0.2 (*)    GFR calc non Af Amer 69 (*)    GFR calc Af Amer 79 (*)    All other components within normal limits  GLUCOSE, CAPILLARY - Abnormal; Notable for the following:    Glucose-Capillary 155 (*)    All other components within normal limits  POCT I-STAT, CHEM 8 - Abnormal; Notable for the following:    Glucose, Bld 186 (*)    Hemoglobin 12.9 (*)    HCT 38.0 (*)    All other components within normal limits  ETHANOL  PROTIME-INR  APTT  DIFFERENTIAL  TROPONIN I  URINE RAPID DRUG SCREEN (HOSP PERFORMED)  URINALYSIS, ROUTINE W REFLEX MICROSCOPIC  POCT I-STAT TROPONIN I   Imaging Review Ct Head Wo Contrast  10/01/2013   CLINICAL DATA:  Aphasic  EXAM:  CT HEAD WITHOUT CONTRAST  TECHNIQUE: Contiguous axial images were obtained from the base of the skull through the vertex without intravenous contrast.  COMPARISON:  None.  FINDINGS: Skull and Sinuses:There is patchy mucosal thickening in the bilateral paranasal sinuses, worse in the ethmoid and frontal sinuses.  Orbits: Bilateral cataract resection.  Brain: 4 x 2.8 cm parenchymal hematoma in the left temporoparietal cortex and subcortical white matter. There is a tiny calcification or separate hemorrhage in the more deep white matter. There is surrounding edema with local mass effect; no midline shift or herniation. In this location, hemorrhagic infarct or amyloid angiopathy are the most likely causes.  Age advanced chronic small vessel ischemic injury with diffuse cerebral white matter low-attenuation. No hydrocephalus. Generalized cerebral volume loss.  Critical Value/emergent results were called by telephone at the time of interpretation on 10/01/2013 at 3:54 PM to Dr. Ezequiel Essex , who verbally acknowledged these results.  IMPRESSION: 1. 4 x 3 cm left temporal/parietal parenchymal hematoma. 2. 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 3. Diffuse inflammatory paranasal sinus disease.   Electronically Signed   By: Jorje Guild M.D.   On: 10/01/2013 15:59  EKG Interpretation    Date/Time:  Monday October 01 2013 14:31:38 EST Ventricular Rate:  74 PR Interval:  272 QRS Duration: 87 QT Interval:  418 QTC Calculation: 464 R Axis:   -6 Text Interpretation:  Sinus or ectopic atrial rhythm Prolonged PR interval Anterior infarct, old No significant change was found Confirmed by Wyvonnia Dusky  MD, Praise Dolecki [phone]) on 10/01/2013 2:42:07 PM            MDM   Final diagnoses:  ICH (intracerebral hemorrhage)    Expressive aphasia with mental status change, lasting normal last night. Patient not TPA candidate secondary to delayed presentation.  CT head shows L ICH.  Patient is  hypertensive. Patient is not anticoagulated.  Protecting airway and responsive at this time. D/w family. Patient does not have an advanced directive.   D/w Dr. Doy Mince who will admit patient.  Will start nicardipine drip for BP elevation.  CRITICAL CARE Performed by: Ezequiel Essex Total critical care time: 30 Critical care time was exclusive of separately billable procedures and treating other patients. Critical care was necessary to treat or prevent imminent or life-threatening deterioration. Critical care was time spent personally by me on the following activities: development of treatment plan with patient and/or surrogate as well as nursing, discussions with consultants, evaluation of patient's response to treatment, examination of patient, obtaining history from patient or surrogate, ordering and performing treatments and interventions, ordering and review of laboratory studies, ordering and review of radiographic studies, pulse oximetry and re-evaluation of patient's condition.    Ezequiel Essex, MD 10/01/13 [phone]

## 2013-10-01 NOTE — ED Notes (Signed)
Family at bedside. 

## 2013-10-01 NOTE — ED Notes (Addendum)
Pt presents from the [facility], NP on site said that after admin of daily med pt began to have expressive aphasia. Long med HX. Including blind in both eyes, HTN, DM. Per [facility] LKW 16 hours ago, L hand grip weak but no other weakness noted. Vitals 172/132 A-paced HR 98 CBG 213 and afebrile.

## 2013-10-01 NOTE — ED Notes (Signed)
Myself and Dee, NT undressed pt, placed on monitor, continuous pulse oximetry and blood pressure cuff

## 2013-10-01 NOTE — ED Notes (Signed)
Security at bedside to take pts money to the safe/lock box. 2 RNs counted and confirmed amount.

## 2013-10-01 NOTE — ED Notes (Signed)
Daughter of Pt. [name] called for update on her father. Says that he had a partial foot amputation some while ago but normally functions very well "His Brain works." States that she will contact local family members to report to the hospital. [facility] personnel the permission to contact her. [phone]

## 2013-10-01 NOTE — ED Notes (Addendum)
Audrea Muscat Placey-Nurse Practitioner at [facility] adult daycare. LKW 16 hrs at home with roommate. The roommate stated to her that the pt. Was not making sense and acting funny. NP then called [facility].

## 2013-10-01 NOTE — ED Notes (Signed)
EDP Rancour communicated with daughter of pt.

## 2013-10-02 ENCOUNTER — Inpatient Hospital Stay (HOSPITAL_COMMUNITY): Payer: Medicare Other

## 2013-10-02 DIAGNOSIS — I4891 Unspecified atrial fibrillation: Secondary | ICD-10-CM

## 2013-10-02 DIAGNOSIS — J9600 Acute respiratory failure, unspecified whether with hypoxia or hypercapnia: Secondary | ICD-10-CM

## 2013-10-02 DIAGNOSIS — J449 Chronic obstructive pulmonary disease, unspecified: Secondary | ICD-10-CM

## 2013-10-02 DIAGNOSIS — I10 Essential (primary) hypertension: Secondary | ICD-10-CM

## 2013-10-02 DIAGNOSIS — I619 Nontraumatic intracerebral hemorrhage, unspecified: Principal | ICD-10-CM

## 2013-10-02 DIAGNOSIS — J4489 Other specified chronic obstructive pulmonary disease: Secondary | ICD-10-CM

## 2013-10-02 DIAGNOSIS — I615 Nontraumatic intracerebral hemorrhage, intraventricular: Secondary | ICD-10-CM

## 2013-10-02 LAB — BLOOD GAS, ARTERIAL
Acid-Base Excess: 0.6 mmol/L (ref 0.0–2.0)
Acid-base deficit: 1.1 mmol/L (ref 0.0–2.0)
Bicarbonate: 28.1 mEq/L — ABNORMAL HIGH (ref 20.0–24.0)
Bicarbonate: 24.3 mEq/L — ABNORMAL HIGH (ref 20.0–24.0)
Drawn by: 362771
Drawn by: 362771
FIO2: 1 %
FIO2: 1 %
MECHVT: 670 mL
O2 Saturation: 90.3 %
O2 Saturation: 95.7 %
PATIENT TEMPERATURE: 97.2
PCO2 ART: 49.3 mmHg — AB (ref 35.0–45.0)
PEEP: 8 cmH2O
PH ART: 7.197 — AB (ref 7.350–7.450)
PO2 ART: 90.5 mmHg (ref 80.0–100.0)
Patient temperature: 98.6
RATE: 22 resp/min
TCO2: 30.4 mmol/L (ref 0–100)
TCO2: 25.8 mmol/L (ref 0–100)
pCO2 arterial: 74.2 mmHg (ref 35.0–45.0)
pH, Arterial: 7.313 — ABNORMAL LOW (ref 7.350–7.450)
pO2, Arterial: 74.5 mmHg — ABNORMAL LOW (ref 80.0–100.0)

## 2013-10-02 LAB — BASIC METABOLIC PANEL
BUN: 11 mg/dL (ref 6–23)
CALCIUM: 8.8 mg/dL (ref 8.4–10.5)
CO2: 25 mEq/L (ref 19–32)
Chloride: 101 mEq/L (ref 96–112)
Creatinine, Ser: 1.39 mg/dL — ABNORMAL HIGH (ref 0.50–1.35)
GFR, EST AFRICAN AMERICAN: 60 mL/min — AB (ref >90)
GFR, EST NON AFRICAN AMERICAN: 52 mL/min — AB (ref >90)
Glucose, Bld: 160 mg/dL — ABNORMAL HIGH (ref 70–99)
POTASSIUM: 3.9 meq/L (ref 3.7–5.3)
Sodium: 138 mEq/L (ref 137–147)

## 2013-10-02 LAB — GLUCOSE, CAPILLARY
GLUCOSE-CAPILLARY: 159 mg/dL — AB (ref 70–99)
GLUCOSE-CAPILLARY: 98 mg/dL (ref 70–99)
GLUCOSE-CAPILLARY: 103 mg/dL — AB (ref 70–99)
Glucose-Capillary: 114 mg/dL — ABNORMAL HIGH (ref 70–99)
Glucose-Capillary: 141 mg/dL — ABNORMAL HIGH (ref 70–99)
Glucose-Capillary: 125 mg/dL — ABNORMAL HIGH (ref 70–99)

## 2013-10-02 LAB — CBC
HCT: 33 % — ABNORMAL LOW (ref 39.0–52.0)
Hemoglobin: 10.8 g/dL — ABNORMAL LOW (ref 13.0–17.0)
MCH: 26.5 pg (ref 26.0–34.0)
MCHC: 32.7 g/dL (ref 30.0–36.0)
MCV: 81.1 fL (ref 78.0–100.0)
Platelets: 245 10*3/uL (ref 150–400)
RBC: 4.07 MIL/uL — ABNORMAL LOW (ref 4.22–5.81)
RDW: 15.4 % (ref 11.5–15.5)
WBC: 8.7 10*3/uL (ref 4.0–10.5)

## 2013-10-02 LAB — HEMOGLOBIN A1C
Hgb A1c MFr Bld: 8.2 % — ABNORMAL HIGH (ref <5.7)
MEAN PLASMA GLUCOSE: 189 mg/dL — AB (ref <117)

## 2013-10-02 MED ORDER — INSULIN ASPART 100 UNIT/ML ~~LOC~~ SOLN
0.0000 [IU] | SUBCUTANEOUS | Status: DC
  Administered 2013-10-02 (×2): 2 [IU] via SUBCUTANEOUS
  Administered 2013-10-02: 3 [IU] via SUBCUTANEOUS
  Administered 2013-10-03 – 2013-10-05 (×10): 2 [IU] via SUBCUTANEOUS
  Administered 2013-10-05 – 2013-10-06 (×6): 3 [IU] via SUBCUTANEOUS
  Administered 2013-10-06 – 2013-10-07 (×6): 2 [IU] via SUBCUTANEOUS
  Administered 2013-10-07: 3 [IU] via SUBCUTANEOUS
  Administered 2013-10-07: 2 [IU] via SUBCUTANEOUS
  Administered 2013-10-08: 3 [IU] via SUBCUTANEOUS
  Administered 2013-10-08 (×2): 2 [IU] via SUBCUTANEOUS
  Administered 2013-10-08: 3 [IU] via SUBCUTANEOUS
  Administered 2013-10-08: 2 [IU] via SUBCUTANEOUS
  Administered 2013-10-09 (×4): 3 [IU] via SUBCUTANEOUS
  Administered 2013-10-09: 2 [IU] via SUBCUTANEOUS
  Administered 2013-10-09 – 2013-10-10 (×2): 3 [IU] via SUBCUTANEOUS
  Administered 2013-10-10: 04:00:00 via SUBCUTANEOUS
  Administered 2013-10-10: 3 [IU] via SUBCUTANEOUS
  Administered 2013-10-10: 5 [IU] via SUBCUTANEOUS
  Administered 2013-10-10 – 2013-10-11 (×5): 3 [IU] via SUBCUTANEOUS

## 2013-10-02 MED ORDER — VITAL HIGH PROTEIN PO LIQD
1000.0000 mL | ORAL | Status: DC
  Administered 2013-10-02 (×2): 1000 mL
  Filled 2013-10-02 (×3): qty 1000

## 2013-10-02 MED ORDER — MIDAZOLAM HCL 2 MG/2ML IJ SOLN
INTRAMUSCULAR | Status: AC
  Filled 2013-10-02: qty 4

## 2013-10-02 MED ORDER — ROCURONIUM BROMIDE 50 MG/5ML IV SOLN
50.0000 mg | Freq: Once | INTRAVENOUS | Status: AC
  Administered 2013-10-02: 50 mg via INTRAVENOUS

## 2013-10-02 MED ORDER — COLCHICINE 0.6 MG PO TABS
0.6000 mg | ORAL_TABLET | Freq: Two times a day (BID) | ORAL | Status: DC | PRN
  Filled 2013-10-02: qty 1

## 2013-10-02 MED ORDER — BIOTENE DRY MOUTH MT LIQD
15.0000 mL | Freq: Four times a day (QID) | OROMUCOSAL | Status: DC
  Administered 2013-10-02 – 2013-10-03 (×5): 15 mL via OROMUCOSAL

## 2013-10-02 MED ORDER — MIDAZOLAM HCL 2 MG/2ML IJ SOLN
2.0000 mg | Freq: Once | INTRAMUSCULAR | Status: AC
  Administered 2013-10-02: 2 mg via INTRAVENOUS

## 2013-10-02 MED ORDER — ISOSORBIDE MONONITRATE ER 30 MG PO TB24
30.0000 mg | ORAL_TABLET | Freq: Every day | ORAL | Status: DC
  Administered 2013-10-02 – 2013-10-05 (×3): 30 mg via ORAL
  Filled 2013-10-02 (×5): qty 1

## 2013-10-02 MED ORDER — SODIUM CHLORIDE 0.9 % IV SOLN
INTRAVENOUS | Status: DC
  Administered 2013-10-02: 20 mL/h via INTRAVENOUS
  Administered 2013-10-05 – 2013-10-11 (×3): via INTRAVENOUS

## 2013-10-02 MED ORDER — SODIUM CHLORIDE 0.9 % IV BOLUS (SEPSIS): 1000.0000 mL | Freq: Once | INTRAVENOUS | Status: DC

## 2013-10-02 MED ORDER — PRO-STAT SUGAR FREE PO LIQD
60.0000 mL | Freq: Four times a day (QID) | ORAL | Status: DC
  Administered 2013-10-02 – 2013-10-09 (×21): 60 mL
  Filled 2013-10-02 (×31): qty 60

## 2013-10-02 MED ORDER — ETOMIDATE 2 MG/ML IV SOLN
20.0000 mg | Freq: Once | INTRAVENOUS | Status: AC
  Administered 2013-10-02: 20 mg via INTRAVENOUS

## 2013-10-02 MED ORDER — CLONIDINE HCL 0.1 MG PO TABS
0.1000 mg | ORAL_TABLET | Freq: Two times a day (BID) | ORAL | Status: DC
  Administered 2013-10-02 – 2013-10-16 (×24): 0.1 mg
  Filled 2013-10-02 (×29): qty 1

## 2013-10-02 MED ORDER — LABETALOL HCL 5 MG/ML IV SOLN
INTRAVENOUS | Status: AC
  Filled 2013-10-02: qty 4

## 2013-10-02 MED ORDER — SODIUM CHLORIDE 0.9 % IV SOLN
25.0000 ug/h | INTRAVENOUS | Status: DC
  Administered 2013-10-02: 25 ug/h via INTRAVENOUS
  Filled 2013-10-02: qty 50

## 2013-10-02 MED ORDER — PROPOFOL 10 MG/ML IV EMUL
INTRAVENOUS | Status: AC
  Filled 2013-10-02: qty 100

## 2013-10-02 MED ORDER — ADULT MULTIVITAMIN LIQUID CH
5.0000 mL | Freq: Every day | ORAL | Status: DC
  Administered 2013-10-02 – 2013-10-09 (×6): 5 mL via ORAL
  Filled 2013-10-02 (×8): qty 5

## 2013-10-02 MED ORDER — FENTANYL CITRATE 0.05 MG/ML IJ SOLN
25.0000 ug | INTRAMUSCULAR | Status: DC | PRN
  Administered 2013-10-02: 25 ug via INTRAVENOUS
  Filled 2013-10-02: qty 2

## 2013-10-02 MED ORDER — FENTANYL CITRATE 0.05 MG/ML IJ SOLN
100.0000 ug | Freq: Once | INTRAMUSCULAR | Status: AC
Start: 2013-10-02 — End: 2013-10-02
  Administered 2013-10-02: 100 ug via INTRAVENOUS

## 2013-10-02 MED ORDER — FENTANYL CITRATE 0.05 MG/ML IJ SOLN
INTRAMUSCULAR | Status: AC
  Filled 2013-10-02: qty 2

## 2013-10-02 MED ORDER — LABETALOL HCL 5 MG/ML IV SOLN
15.0000 mg | Freq: Once | INTRAVENOUS | Status: AC
  Administered 2013-10-02: 15 mg via INTRAVENOUS

## 2013-10-02 MED ORDER — PROPOFOL 10 MG/ML IV EMUL
5.0000 ug/kg/min | INTRAVENOUS | Status: DC
  Administered 2013-10-02: 20 ug/kg/min via INTRAVENOUS
  Administered 2013-10-02: 30 ug/kg/min via INTRAVENOUS
  Administered 2013-10-02 (×2): 25 ug/kg/min via INTRAVENOUS
  Administered 2013-10-02: 30 ug/kg/min via INTRAVENOUS
  Administered 2013-10-03: 25 ug/kg/min via INTRAVENOUS
  Administered 2013-10-03: 30 ug/kg/min via INTRAVENOUS
  Filled 2013-10-02 (×7): qty 100

## 2013-10-02 MED ORDER — CARVEDILOL 25 MG PO TABS
25.0000 mg | ORAL_TABLET | Freq: Two times a day (BID) | ORAL | Status: DC
  Administered 2013-10-02 – 2013-10-16 (×25): 25 mg
  Filled 2013-10-02 (×30): qty 1

## 2013-10-02 MED ORDER — LABETALOL HCL 5 MG/ML IV SOLN
10.0000 mg | INTRAVENOUS | Status: DC | PRN
  Administered 2013-10-03: 10 mg via INTRAVENOUS
  Administered 2013-10-07 – 2013-10-08 (×4): 15 mg via INTRAVENOUS
  Filled 2013-10-02 (×8): qty 4

## 2013-10-02 MED ORDER — ETOMIDATE 2 MG/ML IV SOLN
INTRAVENOUS | Status: AC
  Filled 2013-10-02: qty 10

## 2013-10-02 MED ORDER — CHLORHEXIDINE GLUCONATE 0.12 % MT SOLN
15.0000 mL | Freq: Two times a day (BID) | OROMUCOSAL | Status: DC
  Administered 2013-10-02 – 2013-10-03 (×3): 15 mL via OROMUCOSAL
  Filled 2013-10-02 (×2): qty 15

## 2013-10-02 MED ORDER — PROPOFOL 10 MG/ML IV EMUL
5.0000 ug/kg/min | INTRAVENOUS | Status: DC
  Administered 2013-10-02: 5 ug/kg/min via INTRAVENOUS

## 2013-10-02 NOTE — Progress Notes (Signed)
SLP Cancellation Note  Patient Details Name: [name] MRN: [id number] DOB: 1946-05-23   Cancelled treatment:       Reason Eval/Treat Not Completed: Medical issues which prohibited therapy. Pt is intubated.    Parrish Daddario, Katherene Ponto 10/02/2013, 8:22 AM

## 2013-10-02 NOTE — Progress Notes (Signed)
INITIAL NUTRITION ASSESSMENT  DOCUMENTATION CODES Per approved criteria  -Obesity Unspecified   INTERVENTION: Start Vital High Protein @ 20 ml/h 60 ml Prostat QID MVI daily  TF regimen provides: 1280 kcal, 162 grams protein, and 401 ml H2O.   TF regimen and current propofol provides: 1900 total calories (23 kcal/kg of IBW)  NUTRITION DIAGNOSIS: Inadequate oral intake related to inability to eat as evidenced by NPO status.  Goal: Enteral nutrition to provide 60-70% of estimated calorie needs (22-25 kcals/kg ideal body weight) and 100% of estimated protein needs, based on ASPEN guidelines for permissive underfeeding in critically ill obese individuals.  Monitor:  Vent status, TF initiation and tolerance, weight trend, labs  Reason for Assessment: Consult received to initiate and manage enteral nutrition support.  68 y.o. male  Admitting Dx: <principal problem not specified>  ASSESSMENT: Pt with multiple medical co-morbidities (uncontrolled DM) who lives with a roommate admitted with altered mental status. Pt with left temp/parietal ICH. Pt desaturated and was intubated.  Nutrition-focused physical exam WNL. Pt obese.  Patient is currently intubated on ventilator support.  MV: 14.8 L/min Temp (24hrs), Avg:97.7 F (36.5 C), Min:97.2 F (36.2 C), Max:98.2 F (36.8 C)  Propofol: 23.5 ml/hr providing 620 kcal per day from lipids   Height: Ht Readings from Last 1 Encounters:  10/02/13 6' (1.829 m)    Weight: Wt Readings from Last 1 Encounters:  10/02/13 289 lb 14.5 oz (131.5 kg)    Ideal Body Weight: 80.9 kg   % Ideal Body Weight: 163%  Wt Readings from Last 10 Encounters:  10/02/13 289 lb 14.5 oz (131.5 kg)  09/11/13 290 lb (131.543 kg)  09/06/13 295 lb 9.6 oz (134.083 kg)  08/29/13 217 lb 12.6 oz (98.788 kg)  07/23/13 290 lb (131.543 kg)  06/13/13 302 lb (136.986 kg)  12/20/12 284 lb 6.4 oz (129.003 kg)  11/03/12 214 lb (97.07 kg)  11/03/12 214 lb (97.07  kg)  12/21/11 314 lb 9.6 oz (142.702 kg)    Usual Body Weight: 290 lb   % Usual Body Weight: 100%  BMI:  Body mass index is 39.31 kg/(m^2).  Estimated Nutritional Needs: Kcal: 2511 Protein: >/= 162 grams Fluid: > 2 L/day  Skin: no issues noted  Diet Order: NPO  EDUCATION NEEDS: -No education needs identified at this time   Intake/Output Summary (Last 24 hours) at 10/02/13 1131 Last data filed at 10/02/13 0800  Gross per 24 hour  Intake 478.21 ml  Output   1730 ml  Net -1251.79 ml    Last BM: PTA   Labs:   Recent Labs Lab 10/01/13 1504 10/01/13 1519 10/02/13 0500  NA 137 138 138  K 4.0 3.8 3.9  CL 100 100 101  CO2 25  --  25  BUN 11 9 11   CREATININE 1.10 1.20 1.39*  CALCIUM 9.0  --  8.8  GLUCOSE 185* 186* 160*    CBG (last 3)   Recent Labs  10/01/13 2136 10/02/13 0451 10/02/13 0841  GLUCAP 122* 159* 114*   Lab Results  Component Value Date   HGBA1C 8.2* 10/02/2013   Scheduled Meds: . amiodarone  200 mg Oral Daily  . antiseptic oral rinse  15 mL Mouth Rinse QID  . atorvastatin  10 mg Oral q1800  . brimonidine  1 drop Both Eyes TID  . chlorhexidine  15 mL Mouth Rinse BID  . dorzolamide-timolol  1 drop Both Eyes BID  . insulin aspart  0-15 Units Subcutaneous 6 times per day  .  magnesium oxide  400 mg Oral Daily  . pantoprazole (PROTONIX) IV  40 mg Intravenous QHS  . polyethylene glycol  17 g Oral Daily  . polyvinyl alcohol  1 drop Both Eyes BID  . senna-docusate  1 tablet Oral BID    Continuous Infusions: . sodium chloride 20 mL/hr at 10/02/13 0900  . fentaNYL infusion INTRAVENOUS 25 mcg/hr (10/02/13 0900)  . propofol 30 mcg/kg/min (10/02/13 1127)    Past Medical History  Diagnosis Date  . Diabetes type 2, uncontrolled   . Gout   . Hyperlipidemia   . Hypertension   . Obstructive sleep apnea     On CPAP. Last sleep study (06/2009) - Severe obstructive sleep apnea/hypopnea syndrome, apnea-hypopnea index 70.5 per hour with non  positional events moderately loud snoring and oxygen desaturation to a nadir of 85% on room air.  . Benign prostatic hypertrophy   . Morbid obesity   . Cardiomyopathy, hypertensive      diastolic dysfunction by [id number] echo  . Coronary artery disease      nonobstructive. 2 vessel. cardiac catheter in 11/30/2003 showing 60% LAD occlusion, 30% circumflex occlusion.  . 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 Legally blind   . Herpes   . Occult blood positive stool      History of in August 2007.  colonoscopy in January 2008 showing normal colon with fair inadequate prep. By Dr. Silvano Rusk.  . Incarcerated ventral hernia     H/O. S/P repair ventral hernia repair 07/2005.  . Diabetic ulcer of thigh     H/O Rt thigh ulcer.  . Diabetic peripheral neuropathy   . Exertional shortness of breath     "comes and goes" (July 15, 2013)  . GERD (gastroesophageal reflux disease)   . Degenerative joint disease      Right knee.  . Arthritis     "all over" (07/15/13)  . Sepsis 08/26/2013  . Atrial fibrillation     not Candidate for Coumadin d/t rectal bleeding  . Hypotension 09/05/2013    Past Surgical History  Procedure Laterality Date  . Pacemaker lead revision  12/2003     likely microdislodgment  of right ventricular lead  . Ventral hernia repair  07/2005     S/P laparotomy, lysis of adhesions, repair of incarcerated ventral hernia with  partial omentectomy.. Performed by Dr. Marlou Starks.  . Hernia repair    . Cataract extraction, bilateral Bilateral   . Refractive surgery Bilateral   . Eye surgery    . 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 Amputation Left 06/22/2013    Procedure: AMPUTATION FOOT;  Surgeon: Newt Minion, MD;  Location: [place];  Service: Orthopedics;  Laterality: Left;  Left Midfoot Amputation  . Insert / replace / remove pacemaker  11/30/2003    Secondary to symptomatic bradycardia. DDDR pacemaker. By Dr. Rollene Fare.  . Insert / replace  / remove pacemaker  11/29/12    "old pacemaker died; had new one put in" (Jul 15, 2013)    Maylon Peppers [place], [place], [place] Pager [phone] After Hours Pager

## 2013-10-02 NOTE — Progress Notes (Signed)
RT called for stat blood gas.  ABG pulled and results required intubation.  Pt was unresponsive and was given meds for sedation and intubated by icu physician.  No complications noted and pt is undergoing central line insertion at this time.  Rt will continue to monitor.

## 2013-10-02 NOTE — Progress Notes (Signed)
Neuro MD paged concerning pt lethargy, orders received for ABG, abg result panic values called to Neuro MD, CCM was on the floor seeing other patients at that time.  CCM consulted and patient intubated, central line placed.

## 2013-10-02 NOTE — Consult Note (Addendum)
PULMONARY / CRITICAL CARE MEDICINE  Name: [name] MRN: [id number] DOB: 03-09-1946    ADMISSION DATE:  10/01/2013 CONSULTATION DATE:  10/02/2013  REFERRING MD :  Stroke PRIMARY SERVICE: Stroke  CHIEF COMPLAINT:  Acute respiratory failure   BRIEF PATIENT DESCRIPTION: 68 year old male with multiple co-morbids including suspected OSA/OHS. Admitted for left temporal / parietal ICH on 2/16. Developed decrease LOC and progressive respiratory failure early in the am on 2/17. PCCM asked to see emergently in consult.   SIGNIFICANT EVENTS / STUDIES:  1/26  Head CT >>> 1. 4 x 3 cm left temporal/parietal parenchymal hematoma. 2. 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 3. Diffuse inflammatory paranasal sinus disease.  LINES / TUBES: L IJ CVL 2/17 >>> OETT 2/17 >>> OGT 1/17 >>> Foley 2/17 >>>  CULTURES:  ANTIBIOTICS:  HISTORY OF PRESENT ILLNESS: 68 year old male w/ multiple medical co-morbids. Lives w/ room mate who noted his MS to be altered on 2/16. EMS called. On arrival to ER eval + for expressive and receptive aphasia. CT head + for left temporal/parietal parenchymal hematoma. Admitted to the intensive care for medical management under neuro-hospitalist service. During the early am hours on 2/17 noted to be less responsive w/ desaturation episodes down to 70s . ABG also demonstrating respiratory acidosis. PCCM called emergently to bedside. The pt did become more responsive w/ ambu-bag mask ventilation, however given his neurological state it we did not feel he could reliably protect his airway so he was intubated for acute respiratory failure.   PAST MEDICAL HISTORY :  Past Medical History  Diagnosis Date  . Diabetes type 2, uncontrolled   . Gout   . Hyperlipidemia   . Hypertension   . Obstructive sleep apnea     On CPAP. Last sleep study (06/2009) - Severe obstructive sleep apnea/hypopnea syndrome, apnea-hypopnea index 70.5 per hour with non positional events  moderately loud snoring and oxygen desaturation to a nadir of 85% on room air.  . Benign prostatic hypertrophy   . Morbid obesity   . Cardiomyopathy, hypertensive      diastolic dysfunction by [id number] echo  . Coronary artery disease      nonobstructive. 2 vessel. cardiac catheter in March 2005 showing 60% LAD occlusion, 30% circumflex occlusion.  . 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 Legally blind   . Herpes   . Occult blood positive stool      History of in August 2007.  colonoscopy in January 2008 showing normal colon with fair inadequate prep. By Dr. Silvano Rusk.  . Incarcerated ventral hernia     H/O. S/P repair ventral hernia repair 07/2005.  . Diabetic ulcer of thigh     H/O Rt thigh ulcer.  . Diabetic peripheral neuropathy   . Exertional shortness of breath     "comes and goes" (06/23/2013)  . GERD (gastroesophageal reflux disease)   . Degenerative joint disease      Right knee.  . Arthritis     "all over" (06/23/2013)  . Sepsis 08/26/2013  . Atrial fibrillation     not Candidate for Coumadin d/t rectal bleeding  . Hypotension 09/05/2013   Past Surgical History  Procedure Laterality Date  . Pacemaker lead revision  12/2003     likely microdislodgment  of right ventricular lead  . Ventral hernia repair  07/2005     S/P laparotomy, lysis of adhesions, repair of incarcerated ventral hernia with  partial omentectomy.. Performed by Dr. Marlou Starks.  Marland Kitchen  Hernia repair    . Cataract extraction, bilateral Bilateral   . Refractive surgery Bilateral   . Eye surgery    . 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 Amputation Left 06/22/2013    Procedure: AMPUTATION FOOT;  Surgeon: Newt Minion, MD;  Location: [place];  Service: Orthopedics;  Laterality: Left;  Left Midfoot Amputation  . Insert / replace / remove pacemaker  Nov 14, 2003    Secondary to symptomatic bradycardia. DDDR pacemaker. By Dr. Rollene Fare.  . Insert / replace / remove pacemaker   11/13/12    "old pacemaker died; had new one put in" (06-29-13)   Prior to Admission medications   Medication Sig Start Date End Date Taking? Authorizing Provider  albuterol (PROVENTIL HFA;VENTOLIN HFA) 108 (90 BASE) MCG/ACT inhaler Inhale 2 puffs into the lungs every 6 (six) hours as needed for wheezing.   Yes Historical Provider, MD  amiodarone (PACERONE) 200 MG tablet Take 200 mg by mouth daily.   Yes Historical Provider, MD  aspirin 81 MG chewable tablet Chew 81 mg by mouth daily.   Yes Historical Provider, MD  brimonidine (ALPHAGAN P) 0.1 % SOLN Place 1 drop into both eyes every 8 (eight) hours.   Yes Historical Provider, MD  carvedilol (COREG) 25 MG tablet Take 25 mg by mouth 2 (two) times daily with a meal.    Yes Historical Provider, MD  cloNIDine (CATAPRES) 0.2 MG tablet Take 0.5 tablets (0.1 mg total) by mouth 2 (two) times daily. 09/07/13  Yes Juluis Mire, MD  colchicine 0.6 MG tablet Take 0.6 mg by mouth 2 (two) times daily as needed (for gout flare-ups).    Yes Historical Provider, MD  dorzolamide-timolol (COSOPT) 22.3-6.8 MG/ML ophthalmic solution Place 1 drop into both eyes 2 (two) times daily.   Yes Historical Provider, MD  HYDROcodone-acetaminophen (NORCO/VICODIN) 5-325 MG per tablet Take 1-2 tablets by mouth every 6 (six) hours as needed for moderate pain.   Yes Historical Provider, MD  isosorbide mononitrate (IMDUR) 30 MG 24 hr tablet Take 30 mg by mouth daily.   Yes Historical Provider, MD  magnesium oxide (MAG-OX) 400 MG tablet Take 400 mg by mouth daily.   Yes Historical Provider, MD  methylcellulose (ARTIFICIAL TEARS) 1 % ophthalmic solution Place 1 drop into both eyes 2 (two) times daily.   Yes Historical Provider, MD  omeprazole (PRILOSEC OTC) 20 MG tablet Take 20 mg by mouth every evening.    Yes Historical Provider, MD  polyethylene glycol (MIRALAX / GLYCOLAX) packet Take 17 g by mouth daily.    Yes Historical Provider, MD  rosuvastatin (CRESTOR) 40 MG tablet Take 40 mg  by mouth every evening.   Yes Historical Provider, MD  sennosides-docusate sodium (SENOKOT-S) 8.6-50 MG tablet Take 2 tablets by mouth daily.   Yes Historical Provider, MD  insulin glargine (LANTUS) 100 UNIT/ML injection Inject 0.08 mLs (8 Units total) into the skin at bedtime. 09/07/13   Jerene Pitch, MD   No Known Allergies  FAMILY HISTORY:  Family History  Problem Relation Age of Onset  . Coronary artery disease Sister   . Heart disease Sister   . Allergies Sister   . Asthma Brother   . Rheum arthritis Brother    SOCIAL HISTORY:  reports that he quit smoking about 10 years ago. His smoking use included Cigarettes. He smoked 0.00 packs per day for 0 years. He has never used smokeless tobacco. He reports that he drinks alcohol. He reports that  he does not use illicit drugs.  REVIEW OF SYSTEMS:  Unable   SUBJECTIVE:   VITAL SIGNS: Temp:  [97.2 F (36.2 C)-97.9 F (36.6 C)] 97.2 F (36.2 C) (02/17 0000) Pulse Rate:  [70-81] 73 (02/17 0000) Resp:  [15-48] 15 (02/17 0000) BP: (150-212)/(85-125) 155/85 mmHg (02/17 0000) SpO2:  [93 %-100 %] 97 % (02/17 0000) FiO2 (%):  [100 %] 100 % (02/17 0200) Weight:  [131.5 kg (289 lb 14.5 oz)] 131.5 kg (289 lb 14.5 oz) (02/17 0300)  HEMODYNAMICS:   VENTILATOR SETTINGS: Vent Mode:  [-] PRVC FiO2 (%):  [100 %] 100 % Set Rate:  [22 bmp] 22 bmp Vt Set:  [670 mL] 670 mL PEEP:  [8 cmH20] 8 cmH20 Plateau Pressure:  [22 cmH20] 22 cmH20  INTAKE / OUTPUT: Intake/Output     02/16 0701 - 02/17 0700   I.V. (mL/kg) 463.3 (3.5)   Total Intake(mL/kg) 463.3 (3.5)   Urine (mL/kg/hr) 1400   Total Output 1400   Net -936.7       Urine Occurrence 1 x     PHYSICAL EXAMINATION: General:  Lethargic, does localize after his ambu-bagged.  Neuro:  Lethargic. Localizes. Non-verbal  HEENT:  Neck large. No clear JVD. Now orally intubated  Cardiovascular:  rrr Lungs:  Clear  Abdomen:  Obese  Musculoskeletal:  Intact  Skin:  Intact    LABS:  CBC  Recent Labs Lab 10/01/13 1504 10/01/13 1519  WBC 8.0  --   HGB 11.2* 12.9*  HCT 34.8* 38.0*  PLT 220  --    Coag's  Recent Labs Lab 10/01/13 1504  APTT 32  INR 1.10   BMET  Recent Labs Lab 10/01/13 1504 10/01/13 1519  NA 137 138  K 4.0 3.8  CL 100 100  CO2 25  --   BUN 11 9  CREATININE 1.10 1.20  GLUCOSE 185* 186*   Electrolytes  Recent Labs Lab 10/01/13 1504  CALCIUM 9.0   Sepsis Markers No results found for this basename: LATICACIDVEN, PROCALCITON, O2SATVEN,  in the last 168 hours  ABG  Recent Labs Lab 10/02/13 0209  PHART 7.197*  PCO2ART 74.2*  PO2ART 74.5*   Liver Enzymes  Recent Labs Lab 10/01/13 1504  AST 51*  ALT 77*  ALKPHOS 143*  BILITOT 0.2*  ALBUMIN 2.6*   Cardiac Enzymes  Recent Labs Lab 10/01/13 1504  TROPONINI <0.30   Glucose  Recent Labs Lab 10/01/13 1507 10/01/13 2136  GLUCAP 155* 122*    Imaging Ct Head Wo Contrast  10/01/2013   CLINICAL DATA:  Aphasic  EXAM: CT HEAD WITHOUT CONTRAST  TECHNIQUE: Contiguous axial images were obtained from the base of the skull through the vertex without intravenous contrast.  COMPARISON:  None.  FINDINGS: Skull and Sinuses:There is patchy mucosal thickening in the bilateral paranasal sinuses, worse in the ethmoid and frontal sinuses.  Orbits: Bilateral cataract resection.  Brain: 4 x 2.8 cm parenchymal hematoma in the left temporoparietal cortex and subcortical white matter. There is a tiny calcification or separate hemorrhage in the more deep white matter. There is surrounding edema with local mass effect; no midline shift or herniation. In this location, hemorrhagic infarct or amyloid angiopathy are the most likely causes.  Age advanced chronic small vessel ischemic injury with diffuse cerebral white matter low-attenuation. No hydrocephalus. Generalized cerebral volume loss.  Critical Value/emergent results were called by telephone at the time of interpretation on  10/01/2013 at 3:54 PM to Dr. Ezequiel Essex , who verbally acknowledged these results.  IMPRESSION: 1. 4 x 3 cm left temporal/parietal parenchymal hematoma. 2. 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 3. Diffuse inflammatory paranasal sinus disease.   Electronically Signed   By: Jorje Guild M.D.   On: 10/01/2013 15:59   Chest Port 1 View  10/01/2013   CLINICAL DATA:  Cough, history of coronary artery disease and hypertension.  EXAM: PORTABLE CHEST - 1 VIEW  COMPARISON:  DG CHEST 1V PORT dated 09/05/2013  FINDINGS: The lungs are adequately inflated. There is no focal infiltrate. The cardiac silhouette remains enlarged. The pulmonary vascularity is more prominent today than previously. The permanent pacemaker appears unchanged. There is tortuosity of the descending thoracic aorta.  IMPRESSION: The appearance of the pulmonary interstitium suggests mild interstitial edema likely secondary to CHF. There is no evidence of pneumonia or pleural effusion.   Electronically Signed   By: David  Martinique   On: 10/01/2013 20:04   ASSESSMENT / PLAN:  PULMONARY A:  Acute respiratory failure in setting of acute ICH Suspected OSA/OHS P:   Goal pH>7.30, SpO2>92 Continuous mechanical support VAP bundle Daily SBT Trend ABG/CXR  CARDIOVASCULAR A:   HTN Chronic diastolic heart failure Transient hypotension after sedation / intubation  H/o CAD, AF, non-ischemic cardiomyopathy P:  Goal SBP <160  Continue Amiodarone, Lipitor Hold ASA, Coreg, Clonidine, Imdur Labetalol PRN  RENAL A:   CKD stage 3 P:   Trend BMP  GASTROINTESTINAL A:   Obesity Mild elevated LFTs  GERD on PPI preadmission Nutrition P:   Trend LFT TF per nutritionist Protonix  HEMATOLOGIC A:   Mild anemia  P:  Trend CBC   INFECTIOUS A:   No acute issues P:   No intervention required  ENDOCRINE A:   DM  P:   SSI  NEUROLOGIC A: Left temp/parietal ICH  Acute encephalopathy P:   Per stroke  team Fentanyl / Propofol gtt  I have personally obtained history, examined patient, evaluated and interpreted laboratory and imaging results, reviewed medical records, formulated assessment / plan and placed orders.  CRITICAL CARE:  The patient is critically ill with multiple organ systems failure and requires high complexity decision making for assessment and support, frequent evaluation and titration of therapies, application of advanced monitoring technologies and extensive interpretation of multiple databases. Critical Care Time devoted to patient care services described in this note is 40 minutes.   I was present and supervised the entire procedure.  Rush Farmer, M.D. [facility] Pulmonary/Critical Care Medicine. Pager: [phone]. After hours pager: [phone].

## 2013-10-02 NOTE — Progress Notes (Signed)
PT Cancellation Note  Patient Details Name: [name] MRN: [id number] DOB: [birth year]   Cancelled Treatment:    Reason Eval/Treat Not Completed: Patient not medically ready. Pt intubated over night with medical decline. PT to return when pt medically appropriate.   Kingsley Callander 10/02/2013, 9:19 AM

## 2013-10-02 NOTE — Progress Notes (Signed)
Discussed urine outpt with CCM

## 2013-10-02 NOTE — Progress Notes (Signed)
Portable CT done without event.

## 2013-10-02 NOTE — Procedures (Addendum)
Intubation Procedure Note [name] [id number] Jun 04, 1946  Procedure: Intubation Indications: Respiratory insufficiency  Procedure Details Consent: Unable to obtain consent because of emergent medical necessity. Time Out: Verified patient identification, verified procedure, site/side was marked, verified correct patient position, special equipment/implants available, medications/allergies/relevent history reviewed, required imaging and test results available.  Performed  Maximum sterile technique was used including antiseptics, cap, gloves, hand hygiene and mask.  MAC 4 glide scope    Evaluation Hemodynamic Status: BP stable throughout; O2 sats: stable throughout Patient's Current Condition: stable Complications: No apparent complications Patient did tolerate procedure well. Chest X-ray ordered to verify placement.  CXR: pending.   RyanPETE 10/02/2013  I was present and supervised the entire procedure.  [name], M.D. [facility] Pulmonary/Critical Care Medicine. Pager: [phone]. After hours pager: [phone].

## 2013-10-02 NOTE — Progress Notes (Signed)
UR completed.  Annalese Stiner, RN BSN MHA CCM Trauma/Neuro ICU Case Manager [phone]  

## 2013-10-02 NOTE — Progress Notes (Signed)
Stroke Team Progress Note  HISTORY [name] is an 68 y.o. male who lives with a roommate. Roommate felt that patient was not at his baseline today and this morning felt hat he was hallucinating. He went to his day program where they noticed that he was not talking right. EMS was called at that time.  Date last known well: Date: 09/30/2013  Time last known well: Time: 22:30  Patient was not administerd TPA secondary to delay in arrival. He was admitted to the neuro ICU for further evaluation and treatment.  SUBJECTIVE No family is at the bedside.    OBJECTIVE Most recent Vital Signs: Filed Vitals:   10/02/13 0700 10/02/13 0736 10/02/13 0800 10/02/13 0900  BP: 117/77 117/77 163/109 136/89  Pulse: 70 73 73 74  Temp:   98.1 F (36.7 C)   TempSrc:   Axillary   Resp: 22 22 22 20   Height:      Weight:      SpO2: 100% 100% 100% 100%   CBG (last 3)   Recent Labs  10/01/13 2136 10/02/13 0451 10/02/13 0841  GLUCAP 122* 159* 114*    IV Fluid Intake:   . sodium chloride 20 mL/hr at 10/02/13 0900  . fentaNYL infusion INTRAVENOUS 25 mcg/hr (10/02/13 0900)  . propofol 25 mcg/kg/min (10/02/13 0900)    MEDICATIONS  . amiodarone  200 mg Oral Daily  . antiseptic oral rinse  15 mL Mouth Rinse QID  . atorvastatin  10 mg Oral q1800  . brimonidine  1 drop Both Eyes TID  . chlorhexidine  15 mL Mouth Rinse BID  . dorzolamide-timolol  1 drop Both Eyes BID  . insulin aspart  0-15 Units Subcutaneous 6 times per day  . magnesium oxide  400 mg Oral Daily  . pantoprazole (PROTONIX) IV  40 mg Intravenous QHS  . polyethylene glycol  17 g Oral Daily  . polyvinyl alcohol  1 drop Both Eyes BID  . senna-docusate  1 tablet Oral BID   PRN:  acetaminophen, acetaminophen, fentaNYL, labetalol  Diet:  NPO  Activity:  Bedrest DVT Prophylaxis:  SCDs   CLINICALLY SIGNIFICANT STUDIES Basic Metabolic Panel:   Recent Labs Lab 10/01/13 1504 10/01/13 1519 10/02/13 0500  NA 137 138 138  K 4.0  3.8 3.9  CL 100 100 101  CO2 25  --  25  GLUCOSE 185* 186* 160*  BUN 11 9 11   CREATININE 1.10 1.20 1.39*  CALCIUM 9.0  --  8.8   Liver Function Tests:   Recent Labs Lab 10/01/13 1504  AST 51*  ALT 77*  ALKPHOS 143*  BILITOT 0.2*  PROT 7.3  ALBUMIN 2.6*   CBC:   Recent Labs Lab 10/01/13 1504 10/01/13 1519 10/02/13 0500  WBC 8.0  --  8.7  NEUTROABS 4.8  --   --   HGB 11.2* 12.9* 10.8*  HCT 34.8* 38.0* 33.0*  MCV 80.9  --  81.1  PLT 220  --  245   Coagulation:   Recent Labs Lab 10/01/13 1504  LABPROT 14.0  INR 1.10   Cardiac Enzymes:   Recent Labs Lab 10/01/13 1504  TROPONINI <0.30   Urinalysis:   Recent Labs Lab 10/01/13 1915  COLORURINE YELLOW  LABSPEC 1.013  PHURINE 7.5  GLUCOSEU NEGATIVE  HGBUR NEGATIVE  BILIRUBINUR NEGATIVE  KETONESUR NEGATIVE  PROTEINUR NEGATIVE  UROBILINOGEN 0.2  NITRITE NEGATIVE  LEUKOCYTESUR NEGATIVE   Lipid Panel    Component Value Date/Time   CHOL 171 06/29/2010  TRIG 107 06/29/2010   HDL 32 06/29/2010   CHOLHDL 6.1 Ratio 05/15/2009 2055   VLDL 44* 05/15/2009 2055   LDLCALC 118 06/29/2010   HgbA1C  Lab Results  Component Value Date   HGBA1C 8.2* 10/02/2013    Urine Drug Screen:     Component Value Date/Time   LABOPIA NONE DETECTED 10/01/2013 1915   COCAINSCRNUR NONE DETECTED 10/01/2013 1915   COCAINSCRNUR NEG 12/16/2008 2246   LABBENZ NONE DETECTED 10/01/2013 1915   LABBENZ NEG 12/16/2008 2246   AMPHETMU NONE DETECTED 10/01/2013 1915   AMPHETMU NEG 12/16/2008 2246   THCU NONE DETECTED 10/01/2013 1915   LABBARB NONE DETECTED 10/01/2013 1915    Alcohol Level:   Recent Labs Lab 10/01/13 1504  ETH <11   CT of the brain   10/02/2013   Limited portable CT: Skullbase and posterior fossa not been included.  Evolving left temporoparietal intraparenchymal hematoma with similar mass effect. No new hemorrhage.   10/01/2013    1. 4 x 3 cm left temporal/parietal parenchymal hematoma. 2. 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 3. Diffuse inflammatory paranasal sinus disease.     CXR   10/02/2013    Endotracheal tube 4 cm above the carina. Left central line tip projected laterally towards the lateral wall of the upper SVC. No pneumothorax.  Cardiomegaly with vascular congestion.  Small effusions and bibasilar atelectasis.    10/01/2013   The appearance of the pulmonary interstitium suggests mild interstitial edema likely secondary to CHF. There is no evidence of pneumonia or pleural effusion.     EKG  Sinus or ectopic atrial rhythm. Prolonged PR interval. Anterior infarct, old. No significant change was found.   Therapy Recommendations   Physical Exam   Middle aged male intubated and sedated restless on ventilator.Awake alert. Afebrile. Head is nontraumatic. Neck is supple without bruit. Hearing is normal. Cardiac exam no murmur or gallop. Lungs are clear to auscultation. Distal pulses are well felt. Neurological Exam : Sedated but can be aroused. Eyes are closed. Pupils 6 mm reactive. Spontaneous side-to-side eye movements are present. Fundi were not visualized. No facial weakness. Tongue midline. Able to move all 4 extremities well against gravity left side greater than right. No focal weakness. Does not follow commands. Deep tendon reflexes 1+ symmetric. Plantars downgoing. ASSESSMENT [name] is a 68 y.o. male presenting with altered speech. Imaging confirms a left temporal parietal IPH with significant surrounding edema - no hydrocephalus, no [name]. Suspect underlying mass or ischemic infarct given amt of edema. Hemmorrhage stable per CT this am. No neurologic reason for ventilation . On aspirin 81 mg orally every day prior to admission. Patient purposeful; neuro exam limited due to sedation.   Respiratory failure, intubated during the night due to respiratory insufficiency, low sats atrial fibrillation, not a coumadin candidate secondary to rectal  bleeding hypertension Hyperlipidemia, on crestor 40 mg daily PTA, now on no statin Diabetes, uncontrolled, HgbA1c 8.2, goal 7.0 obstructive sleep apnea Obesity, Body mass index is 39.31 kg/(m^2).   Pacemaker  Legally blind  Herpes  Hospital day # 1  TREATMENT/PLAN  Dr. Leonie Man will discuss with Dr. Blake Divine related to intubation. No indication for intubation from neuro standpoint. Defer extubation management/decision to CCM.  No further testing today  SBP goal < 160. Use prns as needed  Ct head in am  Burnetta Sabin, MSN, RN, ANVP-BC, ANP-BC, [facility] Pager: [phone] 10/02/2013 10:11 AM This patient is critically ill and at significant  risk of neurological worsening, death and care requires constant monitoring of vital signs, hemodynamics,respiratory and cardiac monitoring,review of multiple databases, neurological assessment, discussion with family, other specialists and medical decision making of high complexity. I spent 30 minutes of neurocritical care time  in the care of  this patient. I have personally obtained a history, examined the patient, evaluated imaging results, and formulated the assessment and plan of care. I agree with the above. Antony Contras, MD

## 2013-10-02 NOTE — Progress Notes (Signed)
Patient's oxygen saturation decreasing. Placed on 4L [place] then switched to Venturi mask. Patient is no longer talking and is not responsive to pain. Dr. Aram Beecham notified. ABG ordered. Thayer Ohm D

## 2013-10-02 NOTE — Procedures (Addendum)
Central Venous Catheter Insertion Procedure Note [name] [id number] [birth year]  Procedure: Insertion of Central Venous Catheter Indications: Assessment of intravascular volume, Drug and/or fluid administration and Frequent blood sampling  Procedure Details Consent: Unable to obtain consent because of emergent medical necessity. Time Out: Verified patient identification, verified procedure, site/side was marked, verified correct patient position, special equipment/implants available, medications/allergies/relevent history reviewed, required imaging and test results available.  Performed  Maximum sterile technique was used including antiseptics, cap, gloves, gown, hand hygiene, mask and sheet. Skin prep: Chlorhexidine; local anesthetic administered A antimicrobial bonded/coated triple lumen catheter was placed in the left internal jugular vein using the Seldinger technique.  Evaluation Blood flow good Complications: No apparent complications Patient did tolerate procedure well. Chest X-ray ordered to verify placement.  CXR: pending.  [name],[name] 10/02/2013, 3:30 AM  I was present and supervised the entire procedure.  Rush Farmer, M.D. [facility] Pulmonary/Critical Care Medicine. Pager: [phone]. After hours pager: [phone].

## 2013-10-02 NOTE — Progress Notes (Signed)
Patient much less lethargic, moving about in bed.  Sedation infusing.  Will continue to monitor closely.

## 2013-10-02 NOTE — Progress Notes (Signed)
Pts daughter got pts valuables from security with key escorted by [employer].

## 2013-10-03 ENCOUNTER — Inpatient Hospital Stay (HOSPITAL_COMMUNITY): Payer: Medicare Other

## 2013-10-03 ENCOUNTER — Encounter (HOSPITAL_COMMUNITY): Payer: Self-pay | Admitting: Radiology

## 2013-10-03 DIAGNOSIS — N189 Chronic kidney disease, unspecified: Secondary | ICD-10-CM

## 2013-10-03 DIAGNOSIS — I428 Other cardiomyopathies: Secondary | ICD-10-CM

## 2013-10-03 DIAGNOSIS — N179 Acute kidney failure, unspecified: Secondary | ICD-10-CM

## 2013-10-03 DIAGNOSIS — I119 Hypertensive heart disease without heart failure: Secondary | ICD-10-CM

## 2013-10-03 LAB — GLUCOSE, CAPILLARY
GLUCOSE-CAPILLARY: 143 mg/dL — AB (ref 70–99)
GLUCOSE-CAPILLARY: 130 mg/dL — AB (ref 70–99)
GLUCOSE-CAPILLARY: 106 mg/dL — AB (ref 70–99)
Glucose-Capillary: 111 mg/dL — ABNORMAL HIGH (ref 70–99)
Glucose-Capillary: 125 mg/dL — ABNORMAL HIGH (ref 70–99)
Glucose-Capillary: 127 mg/dL — ABNORMAL HIGH (ref 70–99)
Glucose-Capillary: 130 mg/dL — ABNORMAL HIGH (ref 70–99)

## 2013-10-03 LAB — BASIC METABOLIC PANEL
BUN: 22 mg/dL (ref 6–23)
CO2: 25 mEq/L (ref 19–32)
Calcium: 8.8 mg/dL (ref 8.4–10.5)
Chloride: 103 mEq/L (ref 96–112)
Creatinine, Ser: 1.55 mg/dL — ABNORMAL HIGH (ref 0.50–1.35)
GFR, EST AFRICAN AMERICAN: 53 mL/min — AB (ref >90)
GFR, EST NON AFRICAN AMERICAN: 45 mL/min — AB (ref >90)
Glucose, Bld: 146 mg/dL — ABNORMAL HIGH (ref 70–99)
POTASSIUM: 4.2 meq/L (ref 3.7–5.3)
Sodium: 139 mEq/L (ref 137–147)

## 2013-10-03 LAB — CBC
HEMATOCRIT: 34.5 % — AB (ref 39.0–52.0)
Hemoglobin: 11.2 g/dL — ABNORMAL LOW (ref 13.0–17.0)
MCH: 26.1 pg (ref 26.0–34.0)
MCHC: 32.5 g/dL (ref 30.0–36.0)
MCV: 80.4 fL (ref 78.0–100.0)
PLATELETS: 179 10*3/uL (ref 150–400)
RBC: 4.29 MIL/uL (ref 4.22–5.81)
RDW: 16 % — AB (ref 11.5–15.5)
WBC: 12.4 10*3/uL — AB (ref 4.0–10.5)

## 2013-10-03 MED ORDER — FUROSEMIDE 10 MG/ML IJ SOLN
INTRAMUSCULAR | Status: AC
  Filled 2013-10-03: qty 8

## 2013-10-03 MED ORDER — WHITE PETROLATUM GEL
Status: AC
  Administered 2013-10-03: 0.2
  Filled 2013-10-03: qty 5

## 2013-10-03 MED ORDER — FENTANYL CITRATE 0.05 MG/ML IJ SOLN
100.0000 ug | INTRAMUSCULAR | Status: AC | PRN
  Administered 2013-10-03 (×3): 100 ug via INTRAVENOUS
  Filled 2013-10-03 (×4): qty 2

## 2013-10-03 MED ORDER — CHLORHEXIDINE GLUCONATE 0.12 % MT SOLN
15.0000 mL | Freq: Two times a day (BID) | OROMUCOSAL | Status: DC
  Administered 2013-10-03 – 2013-10-16 (×26): 15 mL via OROMUCOSAL
  Filled 2013-10-03 (×25): qty 15

## 2013-10-03 MED ORDER — FENTANYL CITRATE 0.05 MG/ML IJ SOLN
100.0000 ug | INTRAMUSCULAR | Status: DC | PRN
  Administered 2013-10-04 – 2013-10-07 (×2): 100 ug via INTRAVENOUS
  Administered 2013-10-08 – 2013-10-11 (×2): 50 ug via INTRAVENOUS
  Administered 2013-10-12 – 2013-10-15 (×3): 100 ug via INTRAVENOUS
  Filled 2013-10-03 (×6): qty 2

## 2013-10-03 MED ORDER — VANCOMYCIN HCL 10 G IV SOLR
1250.0000 mg | Freq: Two times a day (BID) | INTRAVENOUS | Status: DC
  Administered 2013-10-04 – 2013-10-05 (×4): 1250 mg via INTRAVENOUS
  Filled 2013-10-03 (×6): qty 1250

## 2013-10-03 MED ORDER — PROPOFOL 10 MG/ML IV EMUL
0.0000 ug/kg/min | INTRAVENOUS | Status: DC
  Administered 2013-10-03 – 2013-10-04 (×9): 40 ug/kg/min via INTRAVENOUS
  Administered 2013-10-05: 25 ug/kg/min via INTRAVENOUS
  Administered 2013-10-05: 50 ug/kg/min via INTRAVENOUS
  Administered 2013-10-05 (×2): 25 ug/kg/min via INTRAVENOUS
  Administered 2013-10-05: 50 ug/kg/min via INTRAVENOUS
  Administered 2013-10-06 (×2): 35 ug/kg/min via INTRAVENOUS
  Administered 2013-10-06: 25 ug/kg/min via INTRAVENOUS
  Administered 2013-10-06 (×2): 35 ug/kg/min via INTRAVENOUS
  Administered 2013-10-06: 25 ug/kg/min via INTRAVENOUS
  Administered 2013-10-06 (×2): 35 ug/kg/min via INTRAVENOUS
  Administered 2013-10-07: 25 ug/kg/min via INTRAVENOUS
  Administered 2013-10-07: 50 ug/kg/min via INTRAVENOUS
  Administered 2013-10-07: 30 ug/kg/min via INTRAVENOUS
  Administered 2013-10-07 (×2): 40 ug/kg/min via INTRAVENOUS
  Administered 2013-10-07 – 2013-10-08 (×2): 20 ug/kg/min via INTRAVENOUS
  Administered 2013-10-08: 50 ug/kg/min via INTRAVENOUS
  Administered 2013-10-08: 20 ug/kg/min via INTRAVENOUS
  Administered 2013-10-08: 50 ug/kg/min via INTRAVENOUS
  Administered 2013-10-08: 30 ug/kg/min via INTRAVENOUS
  Administered 2013-10-08: 20 ug/kg/min via INTRAVENOUS
  Administered 2013-10-09 (×2): 15 ug/kg/min via INTRAVENOUS
  Administered 2013-10-09 (×2): 10 ug/kg/min via INTRAVENOUS
  Administered 2013-10-10: 20 ug/kg/min via INTRAVENOUS
  Administered 2013-10-10 – 2013-10-11 (×3): 10 ug/kg/min via INTRAVENOUS
  Administered 2013-10-12: 20 ug/kg/min via INTRAVENOUS
  Administered 2013-10-12: 30 ug/kg/min via INTRAVENOUS
  Administered 2013-10-12: 20 ug/kg/min via INTRAVENOUS
  Administered 2013-10-12: 30 ug/kg/min via INTRAVENOUS
  Filled 2013-10-03 (×35): qty 100
  Filled 2013-10-03: qty 200
  Filled 2013-10-03 (×10): qty 100

## 2013-10-03 MED ORDER — PIPERACILLIN-TAZOBACTAM 3.375 G IVPB 30 MIN
3.3750 g | Freq: Once | INTRAVENOUS | Status: AC
Start: 2013-10-03 — End: 2013-10-03
  Administered 2013-10-03: 3.375 g via INTRAVENOUS
  Filled 2013-10-03: qty 50

## 2013-10-03 MED ORDER — BIOTENE DRY MOUTH MT LIQD
15.0000 mL | Freq: Four times a day (QID) | OROMUCOSAL | Status: DC
  Administered 2013-10-04 – 2013-10-16 (×52): 15 mL via OROMUCOSAL

## 2013-10-03 MED ORDER — PROPOFOL 10 MG/ML IV EMUL
INTRAVENOUS | Status: AC
  Administered 2013-10-03: 1000 mg
  Filled 2013-10-03: qty 100

## 2013-10-03 MED ORDER — CHLORHEXIDINE GLUCONATE 0.12 % MT SOLN
OROMUCOSAL | Status: AC
  Administered 2013-10-03: 15 mL via OROMUCOSAL
  Filled 2013-10-03: qty 15

## 2013-10-03 MED ORDER — VANCOMYCIN HCL 10 G IV SOLR
2500.0000 mg | Freq: Once | INTRAVENOUS | Status: AC
  Administered 2013-10-03: 2500 mg via INTRAVENOUS
  Filled 2013-10-03: qty 2500

## 2013-10-03 MED ORDER — FUROSEMIDE 10 MG/ML IJ SOLN
60.0000 mg | Freq: Once | INTRAMUSCULAR | Status: AC
  Administered 2013-10-03: 60 mg via INTRAVENOUS

## 2013-10-03 MED ORDER — PNEUMOCOCCAL VAC POLYVALENT 25 MCG/0.5ML IJ INJ
0.5000 mL | INJECTION | INTRAMUSCULAR | Status: DC
  Filled 2013-10-03: qty 0.5

## 2013-10-03 MED ORDER — PIPERACILLIN-TAZOBACTAM 3.375 G IVPB
3.3750 g | Freq: Three times a day (TID) | INTRAVENOUS | Status: DC
  Administered 2013-10-03 – 2013-10-07 (×11): 3.375 g via INTRAVENOUS
  Filled 2013-10-03 (×14): qty 50

## 2013-10-03 MED ORDER — PANTOPRAZOLE SODIUM 40 MG IV SOLR: 40.0000 mg | Freq: Every day | INTRAVENOUS | Status: DC

## 2013-10-03 NOTE — Progress Notes (Signed)
Stroke Team Progress Note  HISTORY [name] is an 68 y.o. male who lives with a roommate. Roommate felt that patient was not at his baseline today and this morning felt hat he was hallucinating. He went to his day program where they noticed that he was not talking right. EMS was called at that time.  Date last known well: Date: 09/30/2013  Time last known well: Time: 22:30  Patient was not administerd TPA secondary to delay in arrival. He was admitted to the neuro ICU for further evaluation and treatment.  SUBJECTIVE His daughter is at the bedside.   OBJECTIVE Most recent Vital Signs: Filed Vitals:   10/03/13 0525 10/03/13 0546 10/03/13 0600 10/03/13 0700  BP: 128/79 73/48 84/51  99/59  Pulse: 77 71 71 70  Temp:      TempSrc:      Resp: 21 16 16 16   Height:      Weight:      SpO2: 98% 98% 100% 98%   CBG (last 3)   Recent Labs  10/02/13 1959 10/02/13 2319 10/03/13 0347  GLUCAP 141* 125* 143*    IV Fluid Intake:   . sodium chloride 20 mL/hr at 10/03/13 0700    MEDICATIONS  . amiodarone  200 mg Oral Daily  . atorvastatin  10 mg Oral q1800  . brimonidine  1 drop Both Eyes TID  . carvedilol  25 mg Per Tube BID WC  . cloNIDine  0.1 mg Per Tube BID  . dorzolamide-timolol  1 drop Both Eyes BID  . feeding supplement (PRO-STAT SUGAR FREE 64)  60 mL Per Tube QID  . insulin aspart  0-15 Units Subcutaneous 6 times per day  . isosorbide mononitrate  30 mg Oral Daily  . magnesium oxide  400 mg Oral Daily  . multivitamin  5 mL Oral Daily  . pantoprazole (PROTONIX) IV  40 mg Intravenous QHS  . polyethylene glycol  17 g Oral Daily  . polyvinyl alcohol  1 drop Both Eyes BID  . senna-docusate  1 tablet Oral BID   PRN:  acetaminophen, acetaminophen, labetalol  Diet:     Activity:  Bedrest DVT Prophylaxis:  SCDs   CLINICALLY SIGNIFICANT STUDIES Basic Metabolic Panel:   Recent Labs Lab 10/02/13 0500 10/03/13 0500  NA 138 139  K 3.9 4.2  CL 101 103  CO2 25 25   GLUCOSE 160* 146*  BUN 11 22  CREATININE 1.39* 1.55*  CALCIUM 8.8 8.8   Liver Function Tests:   Recent Labs Lab 10/01/13 1504  AST 51*  ALT 77*  ALKPHOS 143*  BILITOT 0.2*  PROT 7.3  ALBUMIN 2.6*   CBC:   Recent Labs Lab 10/01/13 1504  10/02/13 0500 10/03/13 0500  WBC 8.0  --  8.7 12.4*  NEUTROABS 4.8  --   --   --   HGB 11.2*  < > 10.8* 11.2*  HCT 34.8*  < > 33.0* 34.5*  MCV 80.9  --  81.1 80.4  PLT 220  --  245 179  < > = values in this interval not displayed. Coagulation:   Recent Labs Lab 10/01/13 1504  LABPROT 14.0  INR 1.10   Cardiac Enzymes:   Recent Labs Lab 10/01/13 1504  TROPONINI <0.30   Urinalysis:   Recent Labs Lab 10/01/13 1915  COLORURINE YELLOW  LABSPEC 1.013  PHURINE 7.5  GLUCOSEU NEGATIVE  HGBUR NEGATIVE  BILIRUBINUR NEGATIVE  KETONESUR NEGATIVE  PROTEINUR NEGATIVE  UROBILINOGEN 0.2  NITRITE NEGATIVE  LEUKOCYTESUR NEGATIVE  Lipid Panel    Component Value Date/Time   CHOL 171 06/29/2010   TRIG 107 06/29/2010   HDL 32 06/29/2010   CHOLHDL 6.1 Ratio 05/15/2009 2055   VLDL 44* 05/15/2009 2055   LDLCALC 118 06/29/2010   HgbA1C  Lab Results  Component Value Date   HGBA1C 8.2* 10/02/2013    Urine Drug Screen:     Component Value Date/Time   LABOPIA NONE DETECTED 10/01/2013 1915   COCAINSCRNUR NONE DETECTED 10/01/2013 1915   COCAINSCRNUR NEG 12/16/2008 2246   LABBENZ NONE DETECTED 10/01/2013 1915   LABBENZ NEG 12/16/2008 2246   AMPHETMU NONE DETECTED 10/01/2013 1915   AMPHETMU NEG 12/16/2008 2246   THCU NONE DETECTED 10/01/2013 1915   LABBARB NONE DETECTED 10/01/2013 1915    Alcohol Level:   Recent Labs Lab 10/01/13 1504  ETH <11   CT of the brain   10/03/2013 Contracting left temporal parietal intraparenchymal hematoma, no rebleed. Moderate white matter changes suggest chronic small vessel ischemic disease. 10/02/2013   Limited portable CT: Skullbase and posterior fossa not been included.  Evolving left temporoparietal  intraparenchymal hematoma with similar mass effect. No new hemorrhage.   10/01/2013    1. 4 x 3 cm left temporal/parietal parenchymal hematoma. 2. 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 3. Diffuse inflammatory paranasal sinus disease.     CXR   10/03/2013 Fairly small right pleural effusion with right base atelectasis. Lungs are otherwise clear. Tube and catheter positions as described without pneumothorax. 10/02/2013    Endotracheal tube 4 cm above the carina. Left central line tip projected laterally towards the lateral wall of the upper SVC. No pneumothorax.  Cardiomegaly with vascular congestion.  Small effusions and bibasilar atelectasis.    10/01/2013   The appearance of the pulmonary interstitium suggests mild interstitial edema likely secondary to CHF. There is no evidence of pneumonia or pleural effusion.     EKG  Sinus or ectopic atrial rhythm. Prolonged PR interval. Anterior infarct, old. No significant change was found.   Therapy Recommendations   Physical Exam   Middle aged male extubated   . Afebrile. Head is nontraumatic. Neck is supple without bruit. Hearing is normal. Cardiac exam no murmur or gallop. Lungs are clear to auscultation. Distal pulses are well felt. Neurological Exam : Sleepy but can be aroused. Eyes are closed. Pupils 6 mm reactive. Spontaneous side-to-side eye movements are present. Fundi were not visualized. No facial weakness. Tongue midline. Able to move all 4 extremities well against gravity left side greater than right. No focal weakness. Does not follow commands. Deep tendon reflexes 1+ symmetric. Plantars downgoing.  ASSESSMENT [name] is a 68 y.o. male presenting with altered speech. Imaging confirms a left temporal parietal IPH with significant surrounding edema - no hydrocephalus, no [name]. Suspect underlying mass or ischemic infarct given amt of edema. Will need MRI to confirm. Hemmorrhage stable per CT this am. Now extubated.   On aspirin 81 mg orally every day prior to admission. Patient purposeful; global aphasia, right hemirparesis, dysphagia.  Respiratory failure, intubated  due to respiratory insufficiency, low sats after admission. Extubated 2/18 am. atrial fibrillation, not a coumadin candidate secondary to rectal bleeding hypertension Hyperlipidemia, on crestor 40 mg daily PTA, now on no statin as NPO Diabetes, uncontrolled, HgbA1c 8.2, goal 7.0 obstructive sleep apnea Obesity, Body mass index is 38.41 kg/(m^2).   Pacemaker  Legally blind  Herpes  Dr. Leonie Man discussed diagnosis, prognosis,  treatment options and plan of care with daughter,  who is POA. patient may need SNF at discharge.Marland Kitchen    Hospital day # 2  TREATMENT/PLAN  monitor in ICU post extubation.  MRI in future, once stable, to eval etiology of underlying cerebral edema  OOB, therapy evaluations  Check swallow  Increase SBP goal to < 180.  lovenox tomorow  Likely SNF  [name] BIBY, MSN, RN, ANVP-BC, ANP-BC, GNP-BC [facility] Pager: [phone] 10/03/2013 7:57 AM  I have personally obtained a history, examined the patient, evaluated imaging results, and formulated the assessment and plan of care. I agree with the above.  Antony Contras, MD

## 2013-10-03 NOTE — Progress Notes (Signed)
eLink Physician-Brief Progress Note Patient Name: [name] DOB: Sep 03, 1945 MRN: [id number]  Date of Service  10/03/2013   HPI/Events of Note   Intubated, thick secretions aspirated from airway and trach post intubation, worrisome for aspiration pneumonia  eICU Interventions  vanc / zosyn written resp culture  Blood culture   Intervention Category Major Interventions: Infection - evaluation and management  [name] 10/03/2013, 8:09 PM

## 2013-10-03 NOTE — Progress Notes (Signed)
eLink Physician-Brief Progress Note Patient Name: [name] DOB: 13-Jun-1946 MRN: [id number]  Date of Service  10/03/2013   HPI/Events of Note  Acute hypoxemic respiratory failure in setting of hypertension O2 saturation 70-80% on NRB, improved somewhat with NIMV but still with increased WOB  eICU Interventions  BIPAP for now Labetalol now Lasix now EKG now Bedside MD to intubate   Intervention Category Major Interventions: Respiratory failure - evaluation and management  MCQUAID, DOUGLAS 10/03/2013, 7:45 PM

## 2013-10-03 NOTE — Progress Notes (Signed)
Pt transported to and from ct w/o event.

## 2013-10-03 NOTE — Progress Notes (Signed)
OT Cancellation Note  Patient Details Name: [name] MRN: [id number] DOB: 04-Apr-1946   Cancelled Treatment:    Reason Eval/Treat Not Completed: Patient not medically ready  [place], OTR/L  [phone] 10/03/2013 10/03/2013, 9:38 AM

## 2013-10-03 NOTE — Progress Notes (Signed)
  PT Cancellation Note  Patient Details Name: [name] MRN: [id number] DOB: Aug 20, 1945   Cancelled Treatment:    Reason Eval/Treat Not Completed: Patient not medically ready. Pt extubated this morning. Spoke with MD, pt continues to be on bedrest, will re-attempt at next available time.    Melina Modena Herrings, [place] 10/03/2013, 8:21 AM

## 2013-10-03 NOTE — Progress Notes (Signed)
10/03/2013- 0740- Resp Care Note- Pt suctioned and extubated to 4lpm cannula.  Pt vital signs post extubation: hr82, rr16, sats100% on 4lpm cannula.  Pt tolerated extubation well with pt resting comfortably post extubation.  Will monitor pt and wean oxygen as tolerated. S Dovey Fatzinger rrt, rcp

## 2013-10-03 NOTE — Progress Notes (Signed)
ANTIBIOTIC CONSULT NOTE - INITIAL  Pharmacy Consult for Vancomycin and Zosyn Indication: rule out pneumonia  No Known Allergies  Patient Measurements: Height: 6' (182.9 cm) Weight: 283 lb 4.7 oz (128.5 kg) IBW/kg (Calculated) : 77.6 Vital Signs: Temp: 98.1 F (36.7 C) (02/18 1600) Temp src: Oral (02/18 1600) BP: 161/93 mmHg (02/18 1800) Pulse Rate: 81 (02/18 1800) Intake/Output from previous day: 02/17 0701 - 02/18 0700 In: 2306.9 [I.V.:1026.9; NG/GT:280] Out: 830 [Urine:830] Labs:  Recent Labs  10/01/13 1504 10/01/13 1519 10/02/13 0500 10/03/13 0500  WBC 8.0  --  8.7 12.4*  HGB 11.2* 12.9* 10.8* 11.2*  PLT 220  --  245 179  CREATININE 1.10 1.20 1.39* 1.55*   Estimated Creatinine Clearance: 65.9 ml/min (by C-G formula based on Cr of 1.55).  Microbiology: 2/18 Respiratory culture >> 2/18 Blood culture x2 >> 2/18 MRSA PCR >> negative  Medical History: Past Medical History  Diagnosis Date  . Diabetes type 2, uncontrolled   . Gout   . Hyperlipidemia   . Hypertension   . Obstructive sleep apnea     On CPAP. Last sleep study (06/2009) - Severe obstructive sleep apnea/hypopnea syndrome, apnea-hypopnea index 70.5 per hour with non positional events moderately loud snoring and oxygen desaturation to a nadir of 85% on room air.  . Benign prostatic hypertrophy   . Morbid obesity   . Cardiomyopathy, hypertensive      diastolic dysfunction by [id number] echo  . Coronary artery disease      nonobstructive. 2 vessel. cardiac catheter in March 2005 showing 60% LAD occlusion, 30% circumflex occlusion.  . 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 Legally blind   . Herpes   . Occult blood positive stool      History of in August 2007.  colonoscopy in January 2008 showing normal colon with fair inadequate prep. By Dr. Silvano Rusk.  . Incarcerated ventral hernia     H/O. S/P repair ventral hernia repair 07/2005.  . Diabetic ulcer of thigh     H/O Rt thigh ulcer.  .  Diabetic peripheral neuropathy   . Exertional shortness of breath     "comes and goes" (06/23/2013)  . GERD (gastroesophageal reflux disease)   . Degenerative joint disease      Right knee.  . Arthritis     "all over" (06/23/2013)  . Sepsis 08/26/2013  . Atrial fibrillation     not Candidate for Coumadin d/t rectal bleeding  . Hypotension 09/05/2013   Medications:  Anti-infectives   None     Assessment: 38 YOM admitted for left temporal/ parietal ICH on 2/16 now with acute respiratory failure and intubated found to have frothy grey and tan secretions with concern for aspiration pneumonia to start vancomycin and Zosyn. Mild leukocytosis (normal yesterday). Patient currently afebrile and hypertensive. SCr 1.55 (rising) with estimated CrCl~65.9 mL/min.   Goal of Therapy:  Vancomycin trough level 15-20 mcg/ml Clinical resolution of infection  Plan:  1. Zosyn 3.375g IV over 30 minutes now, then 3.375g IV q8h - each dose over 4 hours. 2. Vancomycin 2500mg  IV x1, then 1250mg  IV q12h.  3. Vancomycin trough at steady state if continued.  4. Monitor renal function and adjust dosing as needed.   [name], PharmD, BCPS Clinical Pharmacist [phone] 10/03/2013,8:10 PM

## 2013-10-03 NOTE — Evaluation (Signed)
Clinical/Bedside Swallow Evaluation Patient Details  Name: [name] MRN: [id number] Date of Birth: [birth year]  Today's Date: 10/03/2013 Time: 1450-1506 SLP Time Calculation (min): 16 min  Past Medical History:  Past Medical History  Diagnosis Date  . Diabetes type 2, uncontrolled   . Gout   . Hyperlipidemia   . Hypertension   . Obstructive sleep apnea     On CPAP. Last sleep study (06/2009) - Severe obstructive sleep apnea/hypopnea syndrome, apnea-hypopnea index 70.5 per hour with non positional events moderately loud snoring and oxygen desaturation to a nadir of 85% on room air.  . Benign prostatic hypertrophy   . Morbid obesity   . Cardiomyopathy, hypertensive      diastolic dysfunction by [id number] echo  . Coronary artery disease      nonobstructive. 2 vessel. cardiac catheter in 11-19-03 showing 60% LAD occlusion, 30% circumflex occlusion.  . 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 Legally blind   . Herpes   . Occult blood positive stool      History of in August 2007.  colonoscopy in January 2008 showing normal colon with fair inadequate prep. By Dr. Silvano Rusk.  . Incarcerated ventral hernia     H/O. S/P repair ventral hernia repair 07/2005.  . Diabetic ulcer of thigh     H/O Rt thigh ulcer.  . Diabetic peripheral neuropathy   . Exertional shortness of breath     "comes and goes" (07-04-2013)  . GERD (gastroesophageal reflux disease)   . Degenerative joint disease      Right knee.  . Arthritis     "all over" (July 04, 2013)  . Sepsis 08/26/2013  . Atrial fibrillation     not Candidate for Coumadin d/t rectal bleeding  . Hypotension 09/05/2013   Past Surgical History:  Past Surgical History  Procedure Laterality Date  . Pacemaker lead revision  12/2003     likely microdislodgment  of right ventricular lead  . Ventral hernia repair  07/2005     S/P laparotomy, lysis of adhesions, repair of incarcerated ventral hernia with  partial omentectomy.. Performed  by Dr. Marlou Starks.  . Hernia repair    . Cataract extraction, bilateral Bilateral   . Refractive surgery Bilateral   . Eye surgery    . 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 Amputation Left 06/22/2013    Procedure: AMPUTATION FOOT;  Surgeon: Newt Minion, MD;  Location: [facility];  Service: Orthopedics;  Laterality: Left;  Left Midfoot Amputation  . Insert / replace / remove pacemaker  11/19/03    Secondary to symptomatic bradycardia. DDDR pacemaker. By Dr. Rollene Fare.  . Insert / replace / remove pacemaker  11/18/12    "old pacemaker died; had new one put in" (July 04, 2013)   HPI:  68 y.o. male who lives with a roommate. Roommate felt that patient was not at his baseline today and this morning felt hat he was hallucinating. He went to his day program where they noticed that he was not talking right. CT revealed 1. 4 x 3 cm left temporal/parietal parenchymal hematoma.  Repeat CT 2/17 Evolving left temporoparietal intraparenchymal hematoma with similar mass effect. No new hemorrhage.  CXR Fairly small right pleural effusion with right base atelectasis. Lungs are otherwise clear. Tube and catheter positions as described without pneumothorax.  Intubated 2/17-2/18.   Assessment / Plan / Recommendation Clinical Impression  Pt. demonstrated significant difficulty sustaining attention to SLP and swallow assessment.  He  is legally blind, exhibiting significant receptive and expressive aphasia, therefore max verbal and visual cues were ineffecitive in facilitating pt.'s comprehension evaluation.  Eventually, hand over hand tactile cues with spoon to mouth (restraint untied) was effective for several bites applesauce.  Pt. coughing prior to and after po's and unable to evaluate true swallow safety.  Recommend continue NPO given clinical observations as well as his inability to attend and comprehend task at present.  SLP will return next date for continued diagnostic swallow  assessment and attempt a more in depth language-cognitive eval.         Aspiration Risk  Moderate    Diet Recommendation NPO        Other  Recommendations Oral Care Recommendations: Oral care BID   Follow Up Recommendations  Inpatient Rehab    Frequency and Duration min 2x/week  2 weeks   Pertinent Vitals/Pain WDL         Swallow Study         Oral/Motor/Sensory Function Overall Oral Motor/Sensory Function:  (unable to adequately assess, will evaluate)   Ice Chips Ice chips: Not tested   Thin Liquid Thin Liquid:  (pt. refused)    Nectar Thick Nectar Thick Liquid: Not tested   Honey Thick Honey Thick Liquid: Not tested   Puree Puree: Impaired Presentation: Spoon Oral Phase Impairments: Reduced labial seal;Reduced lingual movement/coordination Oral Phase Functional Implications: Prolonged oral transit Pharyngeal Phase Impairments: Suspected delayed Swallow   Solid   GO    Solid: Not tested       Houston Siren M.Ed [employer] [phone]  10/03/2013

## 2013-10-03 NOTE — Progress Notes (Signed)
[facility] Progress Note Patient Name: [name] DOB: [birth year] MRN: [id number]  Date of Service  10/03/2013   HPI/Events of Note   intubated  eICU Interventions  Vent orders, sedation orders written   Intervention Category Major Interventions: Respiratory failure - evaluation and management  [name] 10/03/2013, 10:13 PM

## 2013-10-03 NOTE — Procedures (Signed)
Intubation Procedure Note Marvelous Garven [id number] 1946/08/11  Procedure: Intubation Indications: Respiratory insufficiency  Procedure Details Consent: Risks of procedure as well as the alternatives and risks of each were explained to the (patient/caregiver).  Consent for procedure obtained. Time Out: Verified patient identification, verified procedure, site/side was marked, verified correct patient position, special equipment/implants available, medications/allergies/relevent history reviewed, required imaging and test results available.  Performed  Maximum sterile technique was used including cap, gloves, gown, hand hygiene and mask.  MAC and 4    Evaluation Hemodynamic Status: BP stable throughout; O2 sats: stable throughout Patient's Current Condition: stable Complications: No apparent complications Patient did tolerate procedure well. Chest X-ray ordered to verify placement.  CXR: pending.   Raylene Miyamoto 10/03/2013  Glide Airway red hypopharynx, airway protection concern prior to ett placed Frothy and grey tan secretions, significant plugs out  Lavon Paganini. Titus Mould, MD, [place] Pgr: [facility]

## 2013-10-03 NOTE — Progress Notes (Signed)
150 mls of Fentanyl, at a concentration of 10 mcg/ml, wasted in sink. Witnessed by H. S[place]terfield, Therapist, sports.

## 2013-10-03 NOTE — Progress Notes (Signed)
[facility] Progress Note Patient Name: [name] DOB: 1945/09/26 MRN: [id number]  Date of Service  10/03/2013   HPI/Events of Note  Patient very agitated.  Moving in bed.  Had been on propofol earlier but this was d/ced and patient placed on intermittent sedation with fentanyl which is not holding the patient.  Patient vented and HD stable.   eICU Interventions  Plan: Continue with prn fentanyl Start continuous propofol sedation   Intervention Category Major Interventions: Delirium, psychosis, severe agitation - evaluation and management  DETERDING,ELIZABETH 10/03/2013, 11:29 PM

## 2013-10-03 NOTE — Progress Notes (Signed)
PULMONARY / CRITICAL CARE MEDICINE  Name: [name] MRN: [id number] DOB: [birth year]    ADMISSION DATE:  10/01/2013 CONSULTATION DATE:  10/02/2013  REFERRING MD :  Stroke PRIMARY SERVICE: Stroke  CHIEF COMPLAINT:  Acute respiratory failure   BRIEF PATIENT DESCRIPTION: 68 year old male with multiple co-morbids including suspected OSA/OHS. Admitted for left temporal / parietal ICH on 2/16. Developed decrease LOC and progressive respiratory failure early in the am on 2/17. PCCM asked to see emergently in consult.   SIGNIFICANT EVENTS / STUDIES:  2/16  Head CT >>> 1. 4 x 3 cm left temporal/parietal parenchymal hematoma. 2. 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 3. Diffuse inflammatory paranasal sinus disease. 2/17  Head CT >>>  Evolving left temporoparietal intraparenchymal hematoma with similar mass effect. No new hemorrhage. 2/18  Head CT >>>  Contracting left temporal parietal intraparenchymal hematoma, no rebleed. Moderate white matter changes suggest chronic small vessel ischemic disease.  LINES / TUBES: L IJ CVL 2/17 >>> OETT 2/17 >>> OGT 1/17 >>> Foley 2/17 >>>  CULTURES:  ANTIBIOTICS:  SUBJECTIVE:  No overnight events.  Doing well on SBT.  VITAL SIGNS: Temp:  [98.1 F (36.7 C)-99.3 F (37.4 C)] 98.9 F (37.2 C) (02/18 0348) Pulse Rate:  [51-100] 71 (02/18 0600) Resp:  [15-26] 16 (02/18 0600) BP: (62-183)/(40-109) 84/51 mmHg (02/18 0600) SpO2:  [94 %-100 %] 100 % (02/18 0600) FiO2 (%):  [40 %-90 %] 40 % (02/18 0600) Weight:  [128.5 kg (283 lb 4.7 oz)] 128.5 kg (283 lb 4.7 oz) (02/18 0317)  HEMODYNAMICS:   VENTILATOR SETTINGS: Vent Mode:  [-] PRVC FiO2 (%):  [40 %-90 %] 40 % Set Rate:  [16 bmp-22 bmp] 16 bmp Vt Set:  [670 mL] 670 mL PEEP:  [8 cmH20] 8 cmH20 Plateau Pressure:  [20 cmH20-23 cmH20] 22 cmH20  INTAKE / OUTPUT: Intake/Output     02/17 0701 - 02/18 0700 02/18 0701 - 02/19 0700   I.V. (mL/kg) 1002.9 (7.8)    Other 1000     NG/GT 280    Total Intake(mL/kg) 2282.9 (17.8)    Urine (mL/kg/hr) 785 (0.3)    Total Output 785     Net +1497.9           PHYSICAL EXAMINATION: General:  No distress, good volumes on SBT Neuro:  Awake, following some commands HEENT:  OETT Cardiovascular:  Regular, no murmurs Lungs:  CTAB Abdomen:  Obese  Musculoskeletal:  Moves all extremities, no edema Skin:  Intact   LABS:  CBC  Recent Labs Lab 10/01/13 1504 10/01/13 1519 10/02/13 0500 10/03/13 0500  WBC 8.0  --  8.7 12.4*  HGB 11.2* 12.9* 10.8* 11.2*  HCT 34.8* 38.0* 33.0* 34.5*  PLT 220  --  245 179   Coag's  Recent Labs Lab 10/01/13 1504  APTT 32  INR 1.10   BMET  Recent Labs Lab 10/01/13 1504 10/01/13 1519 10/02/13 0500 10/03/13 0500  NA 137 138 138 139  K 4.0 3.8 3.9 4.2  CL 100 100 101 103  CO2 25  --  25 25  BUN 11 9 11 22   CREATININE 1.10 1.20 1.39* 1.55*  GLUCOSE 185* 186* 160* 146*   Electrolytes  Recent Labs Lab 10/01/13 1504 10/02/13 0500 10/03/13 0500  CALCIUM 9.0 8.8 8.8   Sepsis Markers No results found for this basename: LATICACIDVEN, PROCALCITON, O2SATVEN,  in the last 168 hours  ABG  Recent Labs Lab 10/02/13 0209 10/02/13 0353  PHART 7.197* 7.313*  PCO2ART  74.2* 49.3*  PO2ART 74.5* 90.5   Liver Enzymes  Recent Labs Lab 10/01/13 1504  AST 51*  ALT 77*  ALKPHOS 143*  BILITOT 0.2*  ALBUMIN 2.6*   Cardiac Enzymes  Recent Labs Lab 10/01/13 1504  TROPONINI <0.30   Glucose  Recent Labs Lab 10/02/13 0841 10/02/13 1223 10/02/13 1705 10/02/13 1959 10/02/13 2319 10/03/13 0347  GLUCAP 114* 98 103* 141* 125* 143*   Imaging Ct Head Wo Contrast  10/03/2013   CLINICAL DATA:  Follow-up evaluation.  EXAM: CT HEAD WITHOUT CONTRAST  TECHNIQUE: Contiguous axial images were obtained from the base of the skull through the vertex without intravenous contrast.  COMPARISON:  CT PORT HEAD WO CM dated 10/02/2013; CT HEAD W/O CM dated 10/01/2013  FINDINGS: Left  temporal parietal 2.7 x 3 point cm a intraparenchymal hematoma was 2.8 x 4 cm. Similar surrounding low-density vasogenic edema with mild mass effect.  The ventricles and sulci are overall normal for patient's age. Mild prominence of the cerebellar folia, somewhat advanced for age. Patchy supratentorial white matter hypodensities exclusive of the vasogenic edema suggests chronic small vessel ischemic disease.  No abnormal extra-axial fluid collections. Basal cisterns are patent. Dolichoectasia of the basilar artery may reflect chronic hypertension. Moderate calcific atherosclerosis of the intracranial vessels.  Chronic paranasal sinus mucosal thickening without air-fluid levels. Mastoid air cells appear well-aerated. Soft tissue within the external auditory canals likely reflects cerumen. Suspected proptosis, recommend clinical correlation.  IMPRESSION: Contracting left temporal parietal intraparenchymal hematoma, no rebleed.  Moderate white matter changes suggest chronic small vessel ischemic disease.   Electronically Signed   By: Elon Alas   On: 10/03/2013 05:18   Ct Head Wo Contrast  10/01/2013   CLINICAL DATA:  Aphasic  EXAM: CT HEAD WITHOUT CONTRAST  TECHNIQUE: Contiguous axial images were obtained from the base of the skull through the vertex without intravenous contrast.  COMPARISON:  None.  FINDINGS: Skull and Sinuses:There is patchy mucosal thickening in the bilateral paranasal sinuses, worse in the ethmoid and frontal sinuses.  Orbits: Bilateral cataract resection.  Brain: 4 x 2.8 cm parenchymal hematoma in the left temporoparietal cortex and subcortical white matter. There is a tiny calcification or separate hemorrhage in the more deep white matter. There is surrounding edema with local mass effect; no midline shift or herniation. In this location, hemorrhagic infarct or amyloid angiopathy are the most likely causes.  Age advanced chronic small vessel ischemic injury with diffuse cerebral white  matter low-attenuation. No hydrocephalus. Generalized cerebral volume loss.  Critical Value/emergent results were called by telephone at the time of interpretation on 10/01/2013 at 3:54 PM to Dr. Ezequiel Essex , who verbally acknowledged these results.  IMPRESSION: 1. 4 x 3 cm left temporal/parietal parenchymal hematoma. 2. 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 3. Diffuse inflammatory paranasal sinus disease.   Electronically Signed   By: Jorje Guild M.D.   On: 10/01/2013 15:59   Portable Chest Xray In Am  10/03/2013   CLINICAL DATA:  Hypoxia  EXAM: PORTABLE CHEST - 1 VIEW  COMPARISON:  October 02, 2013  FINDINGS: Endotracheal tube tip is 3.9 cm above the carina. Nasogastric tube tip and side port are in the stomach. Left jugular catheter tip is in the superior vena cava. No pneumothorax.  There is a right pleural effusion with right base atelectasis. Lungs are otherwise clear. Heart is mildly enlarged with normal pulmonary vascularity. Pacemaker leads are attached to the right atrium and right ventricle.  IMPRESSION: Fairly small  right pleural effusion with right base atelectasis. Lungs are otherwise clear. Tube and catheter positions as described without pneumothorax.   Electronically Signed   By: Lowella Grip M.D.   On: 10/03/2013 07:05   Dg Chest Port 1 View  10/02/2013   CLINICAL DATA:  Intubation.  Central line placement.  EXAM: PORTABLE CHEST - 1 VIEW  COMPARISON:  10/01/2013  FINDINGS: Endotracheal tube is 4 cm above the carina. Left internal jugular central line is in place with the tip directed laterally in the upper SVC. No pneumothorax.  There is cardiomegaly with vascular congestion and bibasilar opacities, likely a combination of effusions and atelectasis. Right pacer remains in place, unchanged.  IMPRESSION: Endotracheal tube 4 cm above the carina. Left central line tip projected laterally towards the lateral wall of the upper SVC. No pneumothorax.  Cardiomegaly  with vascular congestion.  Small effusions and bibasilar atelectasis.   Electronically Signed   By: Rolm Baptise M.D.   On: 10/02/2013 04:08   Chest Port 1 View  10/01/2013   CLINICAL DATA:  Cough, history of coronary artery disease and hypertension.  EXAM: PORTABLE CHEST - 1 VIEW  COMPARISON:  DG CHEST 1V PORT dated 09/05/2013  FINDINGS: The lungs are adequately inflated. There is no focal infiltrate. The cardiac silhouette remains enlarged. The pulmonary vascularity is more prominent today than previously. The permanent pacemaker appears unchanged. There is tortuosity of the descending thoracic aorta.  IMPRESSION: The appearance of the pulmonary interstitium suggests mild interstitial edema likely secondary to CHF. There is no evidence of pneumonia or pleural effusion.   Electronically Signed   By: David  Martinique   On: 10/01/2013 20:04   Dg Abd Portable 1v  10/02/2013   CLINICAL DATA:  Orogastric tube placement  EXAM: PORTABLE ABDOMEN - 1 VIEW  COMPARISON:  None.  FINDINGS: Orogastric tube tip and side port are in the stomach. Bowel gas pattern is overall unremarkable. No free air seen on this supine examination.  IMPRESSION: Orogastric tube tip and side port in stomach.   Electronically Signed   By: Lowella Grip M.D.   On: 10/02/2013 10:03   Ct Portable Head W/o Cm  10/02/2013   CLINICAL DATA:  Altered mental status. Follow-up intracranial hemorrhage.  EXAM: CT HEAD WITHOUT CONTRAST  TECHNIQUE: Contiguous axial images were obtained from the base of the skull through the vertex without intravenous contrast.  COMPARISON:  CT of the head October 01, 2013  FINDINGS: Skull base/posterior fossa is not imaged on today's study.  Similar appearance of left temporoparietal 2.8 x 4 cm (previously 2.8 x 4.1 cm) intraparenchymal hematoma with surrounding low-density vasogenic edema. Local mass effect without midline shift. No new hemorrhage. No hydrocephalus.  Patchy to confluent supratentorial white matter  hypodensities exclusive of the vasogenic edema suggest chronic small vessel ischemic disease. No acute large vascular territory infarct.  No abnormal extra-axial fluid collections. Pan paranasal sinusitis. Status post bilateral ocular lens implants, with apparent proptosis, recommend clinical correlation.  IMPRESSION: Limited portable CT: Skullbase and posterior fossa not been included.  Evolving left temporoparietal intraparenchymal hematoma with similar mass effect. No new hemorrhage.   Electronically Signed   By: Elon Alas   On: 10/02/2013 06:36   ASSESSMENT / PLAN:  PULMONARY A:  Acute respiratory failure in setting of acute ICH Suspected OSA/OHS P:   Wean to extubate Supplemental oxygen with goal SpO2>92 May need autoPAP  CARDIOVASCULAR A:   HTN Chronic diastolic heart failure Transient hypotension after sedation / intubation  H/o CAD, AF, non-ischemic cardiomyopathy P:  Goal SBP <160  Continue Amiodarone, Lipitor Hold ASA Will reevaluate BP after extubated / off sedation for restarting Coreg, Clonidine, Imdur afte Labetalol PRN  RENAL A:   CKD stage 3 P:   Trend BMP  GASTROINTESTINAL A:   Obesity Mild elevated LFTs  GERD on PPI preadmission Nutrition P:   Hold TF expecting extubation Protonix Check LFT in AM  HEMATOLOGIC A:   Mild anemia  P:  Trend CBC   INFECTIOUS A:   No acute issues P:   No intervention required  ENDOCRINE A:   DM  P:   SSI  NEUROLOGIC A: Left temp/parietal ICH  Acute encephalopathy P:   Per stroke team Hold Fentanyl / Propofol expecting extubation  I have personally obtained history, examined patient, evaluated and interpreted laboratory and imaging results, reviewed medical records, formulated assessment / plan and placed orders.  CRITICAL CARE:  The patient is critically ill with multiple organ systems failure and requires high complexity decision making for assessment and support, frequent evaluation and  titration of therapies, application of advanced monitoring technologies and extensive interpretation of multiple databases. Critical Care Time devoted to patient care services described in this note is 35 minutes.   Doree Fudge, MD Pulmonary and [place] Pager: [phone]  10/03/2013, 7:36 AM

## 2013-10-04 ENCOUNTER — Encounter (HOSPITAL_COMMUNITY): Payer: Self-pay | Admitting: Radiology

## 2013-10-04 ENCOUNTER — Inpatient Hospital Stay (HOSPITAL_COMMUNITY): Payer: Medicare Other

## 2013-10-04 DIAGNOSIS — J690 Pneumonitis due to inhalation of food and vomit: Secondary | ICD-10-CM

## 2013-10-04 DIAGNOSIS — I5189 Other ill-defined heart diseases: Secondary | ICD-10-CM

## 2013-10-04 DIAGNOSIS — J810 Acute pulmonary edema: Secondary | ICD-10-CM

## 2013-10-04 DIAGNOSIS — I359 Nonrheumatic aortic valve disorder, unspecified: Secondary | ICD-10-CM

## 2013-10-04 LAB — BLOOD GAS, ARTERIAL
Acid-base deficit: 3.3 mmol/L — ABNORMAL HIGH (ref 0.0–2.0)
Bicarbonate: 20.7 mEq/L (ref 20.0–24.0)
DRAWN BY: 40415
FIO2: 0.6 %
MECHVT: 670 mL
O2 Saturation: 99.1 %
PEEP/CPAP: 8 cmH2O
PH ART: 7.403 (ref 7.350–7.450)
PO2 ART: 144 mmHg — AB (ref 80.0–100.0)
Patient temperature: 98.7
RATE: 16 resp/min
TCO2: 21.7 mmol/L (ref 0–100)
pCO2 arterial: 33.9 mmHg — ABNORMAL LOW (ref 35.0–45.0)

## 2013-10-04 LAB — CBC
HCT: 33.6 % — ABNORMAL LOW (ref 39.0–52.0)
Hemoglobin: 10.9 g/dL — ABNORMAL LOW (ref 13.0–17.0)
MCH: 26.2 pg (ref 26.0–34.0)
MCHC: 32.4 g/dL (ref 30.0–36.0)
MCV: 80.8 fL (ref 78.0–100.0)
Platelets: 143 10*3/uL — ABNORMAL LOW (ref 150–400)
RBC: 4.16 MIL/uL — ABNORMAL LOW (ref 4.22–5.81)
RDW: 15.8 % — ABNORMAL HIGH (ref 11.5–15.5)
WBC: 13.3 10*3/uL — AB (ref 4.0–10.5)

## 2013-10-04 LAB — HEPATIC FUNCTION PANEL
ALBUMIN: 2.4 g/dL — AB (ref 3.5–5.2)
ALK PHOS: 110 U/L (ref 39–117)
ALT: 30 U/L (ref 0–53)
AST: 18 U/L (ref 0–37)
BILIRUBIN TOTAL: 0.7 mg/dL (ref 0.3–1.2)
Bilirubin, Direct: 0.4 mg/dL — ABNORMAL HIGH (ref 0.0–0.3)
Indirect Bilirubin: 0.3 mg/dL (ref 0.3–0.9)
Total Protein: 6.9 g/dL (ref 6.0–8.3)

## 2013-10-04 LAB — BASIC METABOLIC PANEL
BUN: 16 mg/dL (ref 6–23)
CO2: 22 meq/L (ref 19–32)
Calcium: 8.4 mg/dL (ref 8.4–10.5)
Chloride: 95 mEq/L — ABNORMAL LOW (ref 96–112)
Creatinine, Ser: 1.38 mg/dL — ABNORMAL HIGH (ref 0.50–1.35)
GFR calc Af Amer: 60 mL/min — ABNORMAL LOW (ref >90)
GFR calc non Af Amer: 52 mL/min — ABNORMAL LOW (ref >90)
GLUCOSE: 122 mg/dL — AB (ref 70–99)
POTASSIUM: 3.3 meq/L — AB (ref 3.7–5.3)
Sodium: 140 mEq/L (ref 137–147)

## 2013-10-04 LAB — GLUCOSE, CAPILLARY
GLUCOSE-CAPILLARY: 108 mg/dL — AB (ref 70–99)
Glucose-Capillary: 99 mg/dL (ref 70–99)
Glucose-Capillary: 102 mg/dL — ABNORMAL HIGH (ref 70–99)
Glucose-Capillary: 142 mg/dL — ABNORMAL HIGH (ref 70–99)
Glucose-Capillary: 141 mg/dL — ABNORMAL HIGH (ref 70–99)

## 2013-10-04 LAB — TRIGLYCERIDES: TRIGLYCERIDES: 144 mg/dL (ref <150)

## 2013-10-04 LAB — PROCALCITONIN: Procalcitonin: 0.28 ng/mL

## 2013-10-04 MED ORDER — VITAL HIGH PROTEIN PO LIQD
1000.0000 mL | ORAL | Status: DC
  Administered 2013-10-04: 1000 mL
  Administered 2013-10-05: 08:00:00
  Filled 2013-10-04 (×5): qty 1000

## 2013-10-04 MED ORDER — IOHEXOL 300 MG/ML  SOLN: 100.0000 mL | Freq: Once | INTRAMUSCULAR | Status: AC | PRN

## 2013-10-04 MED ORDER — ENOXAPARIN SODIUM 40 MG/0.4ML ~~LOC~~ SOLN
40.0000 mg | SUBCUTANEOUS | Status: DC
  Administered 2013-10-04 – 2013-10-05 (×2): 40 mg via SUBCUTANEOUS
  Filled 2013-10-04 (×3): qty 0.4

## 2013-10-04 MED ORDER — IOHEXOL 350 MG/ML SOLN
100.0000 mL | Freq: Once | INTRAVENOUS | Status: AC | PRN
  Administered 2013-10-04: 100 mL via INTRAVENOUS

## 2013-10-04 NOTE — Consult Note (Signed)
CARDIOLOGY CONSULT NOTE  Patient ID: [name] MRN: [id number] DOB/AGE: 1945-12-26 68 y.o.  Admit date: 10/01/2013 Referring Physician: Dr Leonie Man  Primary Cardiologist: Dr Sallyanne Kuster  Reason for Consultation: right atrial mass  HPI: 68 year-old gentleman with morbid obesity, diabetes mellitus, hypertension, obstructive sleep apnea on CPAP, mild valvular aortic stenosis, mild coronary atherosclerosis treated medically, legal blindness, severe diabetic peripheral neuropathy complicated by a poorly healing wounds, chronic venous insufficiency and a dual-chamber permanent pacemaker that was implanted in 2005 for sinus node dysfunction.  He was admitted 2/16 with mental status changes and expressive aphasia and was diagnosed with left temporal/parietal parenchymal hematoma. He developed progressively decreased level of consciousness requiring intubation.  He had an echo this morning demonstrating a right atrial mobile mass consistent raising suspicion of thrombus. We are asked to see him for further evaluation. Note the patient has a dual chamber pacemaker. He has had PAF but has not been anticoagulated because of hx rectal bleeding and concerns about compliance. At his most recent cardiac eval he was maintaining sinus rhythm based on pacer interrogation.  There is no hx obtainable other than from record review. The patient is intubated and sedated.  Past Medical History  Diagnosis Date  . Diabetes type 2, uncontrolled   . Gout   . Hyperlipidemia   . Hypertension   . Obstructive sleep apnea     On CPAP. Last sleep study (06/2009) - Severe obstructive sleep apnea/hypopnea syndrome, apnea-hypopnea index 70.5 per hour with non positional events moderately loud snoring and oxygen desaturation to a nadir of 85% on room air.  . Benign prostatic hypertrophy   . Morbid obesity   . Cardiomyopathy, hypertensive      diastolic dysfunction by [id number] echo  . Coronary artery disease    nonobstructive. 2 vessel. cardiac catheter in March 2005 showing 60% LAD occlusion, 30% circumflex occlusion.  . 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 Legally blind   . Herpes   . Occult blood positive stool      History of in August 2007.  colonoscopy in January 2008 showing normal colon with fair inadequate prep. By Dr. Silvano Rusk.  . Incarcerated ventral hernia     H/O. S/P repair ventral hernia repair 07/2005.  . Diabetic ulcer of thigh     H/O Rt thigh ulcer.  . Diabetic peripheral neuropathy   . Exertional shortness of breath     "comes and goes" (06/23/2013)  . GERD (gastroesophageal reflux disease)   . Degenerative joint disease      Right knee.  . Arthritis     "all over" (06/23/2013)  . Sepsis 08/26/2013  . Atrial fibrillation     not Candidate for Coumadin d/t rectal bleeding  . Hypotension 09/05/2013     Past Surgical History  Procedure Laterality Date  . Pacemaker lead revision  12/2003     likely microdislodgment  of right ventricular lead  . Ventral hernia repair  07/2005     S/P laparotomy, lysis of adhesions, repair of incarcerated ventral hernia with  partial omentectomy.. Performed by Dr. Marlou Starks.  . Hernia repair    . Cataract extraction, bilateral Bilateral   . Refractive surgery Bilateral   . Eye surgery    . 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 Amputation Left 06/22/2013    Procedure: AMPUTATION FOOT;  Surgeon: Newt Minion, MD;  Location: [place];  Service: Orthopedics;  Laterality: Left;  Left Midfoot Amputation  . Insert / replace / remove pacemaker  2003/11/16    Secondary to symptomatic bradycardia. DDDR pacemaker. By Dr. Rollene Fare.  . Insert / replace / remove pacemaker  11/15/12    "old pacemaker died; had new one put in" (01-Jul-2013)     Family History  Problem Relation Age of Onset  . Coronary artery disease Sister   . Heart disease Sister   . Allergies Sister   . Asthma Brother   . Rheum  arthritis Brother     Social History: History   Social History  . Marital Status: Single    Spouse Name: N/A    Number of Children: Y  . Years of Education: N/A   Occupational History  . disabled    Social History Main Topics  . Smoking status: Former Smoker -- 0.00 packs/day for 0 years    Types: Cigarettes    Quit date: 08/17/2003  . Smokeless tobacco: Never Used     Comment: started at age 43.  only smoked on occ- very rarely.0  . Alcohol Use: Yes     Comment: 07-01-2013 "drank some; never had a problem w/it"  . Drug Use: No  . Sexual Activity: No   Other Topics Concern  . Not on file   Social History Narrative  . No narrative on file     Prior to Admission medications   Medication Sig Start Date End Date Taking? Authorizing Provider  albuterol (PROVENTIL HFA;VENTOLIN HFA) 108 (90 BASE) MCG/ACT inhaler Inhale 2 puffs into the lungs every 6 (six) hours as needed for wheezing.   Yes Historical Provider, MD  amiodarone (PACERONE) 200 MG tablet Take 200 mg by mouth daily.   Yes Historical Provider, MD  aspirin 81 MG chewable tablet Chew 81 mg by mouth daily.   Yes Historical Provider, MD  brimonidine (ALPHAGAN P) 0.1 % SOLN Place 1 drop into both eyes every 8 (eight) hours.   Yes Historical Provider, MD  carvedilol (COREG) 25 MG tablet Take 25 mg by mouth 2 (two) times daily with a meal.    Yes Historical Provider, MD  cloNIDine (CATAPRES) 0.2 MG tablet Take 0.5 tablets (0.1 mg total) by mouth 2 (two) times daily. 09/07/13  Yes Juluis Mire, MD  colchicine 0.6 MG tablet Take 0.6 mg by mouth 2 (two) times daily as needed (for gout flare-ups).    Yes Historical Provider, MD  dorzolamide-timolol (COSOPT) 22.3-6.8 MG/ML ophthalmic solution Place 1 drop into both eyes 2 (two) times daily.   Yes Historical Provider, MD  HYDROcodone-acetaminophen (NORCO/VICODIN) 5-325 MG per tablet Take 1-2 tablets by mouth every 6 (six) hours as needed for moderate pain.   Yes Historical Provider,  MD  isosorbide mononitrate (IMDUR) 30 MG 24 hr tablet Take 30 mg by mouth daily.   Yes Historical Provider, MD  magnesium oxide (MAG-OX) 400 MG tablet Take 400 mg by mouth daily.   Yes Historical Provider, MD  methylcellulose (ARTIFICIAL TEARS) 1 % ophthalmic solution Place 1 drop into both eyes 2 (two) times daily.   Yes Historical Provider, MD  omeprazole (PRILOSEC OTC) 20 MG tablet Take 20 mg by mouth every evening.    Yes Historical Provider, MD  polyethylene glycol (MIRALAX / GLYCOLAX) packet Take 17 g by mouth daily.    Yes Historical Provider, MD  rosuvastatin (CRESTOR) 40 MG tablet Take 40 mg by mouth every evening.   Yes Historical Provider, MD  sennosides-docusate sodium (SENOKOT-S) 8.6-50 MG tablet Take 2 tablets by  mouth daily.   Yes Historical Provider, MD  insulin glargine (LANTUS) 100 UNIT/ML injection Inject 0.08 mLs (8 Units total) into the skin at bedtime. 09/07/13   Jerene Pitch, MD    HOSPITAL MEDICATIONS: . amiodarone  200 mg Oral Daily  . antiseptic oral rinse  15 mL Mouth Rinse QID  . atorvastatin  10 mg Oral q1800  . brimonidine  1 drop Both Eyes TID  . carvedilol  25 mg Per Tube BID WC  . chlorhexidine  15 mL Mouth Rinse BID  . cloNIDine  0.1 mg Per Tube BID  . dorzolamide-timolol  1 drop Both Eyes BID  . enoxaparin (LOVENOX) injection  40 mg Subcutaneous Q24H  . feeding supplement (PRO-STAT SUGAR FREE 64)  60 mL Per Tube QID  . insulin aspart  0-15 Units Subcutaneous 6 times per day  . isosorbide mononitrate  30 mg Oral Daily  . magnesium oxide  400 mg Oral Daily  . multivitamin  5 mL Oral Daily  . pantoprazole (PROTONIX) IV  40 mg Intravenous QHS  . piperacillin-tazobactam (ZOSYN)  IV  3.375 g Intravenous Q8H  . pneumococcal 23 valent vaccine  0.5 mL Intramuscular Tomorrow-1000  . polyethylene glycol  17 g Oral Daily  . polyvinyl alcohol  1 drop Both Eyes BID  . senna-docusate  1 tablet Oral BID  . vancomycin  1,250 mg Intravenous Q12H   . sodium chloride  20 mL/hr at 10/03/13 0700  . propofol 40 mcg/kg/min (10/04/13 0700)    ROS: Not able to obtain.  Physical Exam: Blood pressure 108/67, pulse 71, temperature 99.2 F (37.3 C), temperature source Axillary, resp. rate 16, height 6' (1.829 m), weight 283 lb 4.7 oz (128.5 kg), SpO2 100.00%.  Pt is morbidly obese, intubated, sedated HEENT: normal except for ET tube in place Neck: JVP - unable to visualize. Carotid upstrokes normal without bruits. No thyromegaly. Lungs: equal expansion, coarse bilaterally CV: Apex is nonpalpable, RRR without murmur or gallop Abd: soft, NT, +BS, no bruit, obese Back: no CVA tenderness Ext: diffuse 1+ edema        DP/PT pulses intact and = Skin: warm and dry without rash Neuro: sedated.   Labs:   Lab Results  Component Value Date   WBC 13.3* 10/04/2013   HGB 10.9* 10/04/2013   HCT 33.6* 10/04/2013   MCV 80.8 10/04/2013   PLT 143* 10/04/2013    Recent Labs Lab 10/04/13 0758  NA 140  K 3.3*  CL 95*  CO2 22  BUN 16  CREATININE 1.38*  CALCIUM 8.4  PROT 6.9  BILITOT 0.7  ALKPHOS 110  ALT 30  AST 18  GLUCOSE 122*   Lab Results  Component Value Date   CKTOTAL 160 08/21/2008   CKMB 2.4 08/21/2008   TROPONINI <0.30 10/01/2013    Lab Results  Component Value Date   CHOL 171 06/29/2010   CHOL 214* 05/15/2009   CHOL 187 11/07/2008   Lab Results  Component Value Date   HDL 32 06/29/2010   HDL 35* 05/15/2009   HDL 36* 11/07/2008   Lab Results  Component Value Date   LDLCALC 118 06/29/2010   LDLCALC 135* 05/15/2009   LDLCALC 128* 11/07/2008   Lab Results  Component Value Date   TRIG 144 10/04/2013   TRIG 107 06/29/2010   TRIG 222* 05/15/2009   Lab Results  Component Value Date   CHOLHDL 6.1 Ratio 05/15/2009   CHOLHDL 5.2 Ratio 11/07/2008   CHOLHDL 4.6 Ratio 09/24/2008  No results found for this basename: LDLDIRECT      Radiology: Ct Head Wo Contrast  10/03/2013   CLINICAL DATA:  Follow-up evaluation.  EXAM: CT HEAD WITHOUT CONTRAST   TECHNIQUE: Contiguous axial images were obtained from the base of the skull through the vertex without intravenous contrast.  COMPARISON:  CT PORT HEAD WO CM dated 10/02/2013; CT HEAD W/O CM dated 10/01/2013  FINDINGS: Left temporal parietal 2.7 x 3 point cm a intraparenchymal hematoma was 2.8 x 4 cm. Similar surrounding low-density vasogenic edema with mild mass effect.  The ventricles and sulci are overall normal for patient's age. Mild prominence of the cerebellar folia, somewhat advanced for age. Patchy supratentorial white matter hypodensities exclusive of the vasogenic edema suggests chronic small vessel ischemic disease.  No abnormal extra-axial fluid collections. Basal cisterns are patent. Dolichoectasia of the basilar artery may reflect chronic hypertension. Moderate calcific atherosclerosis of the intracranial vessels.  Chronic paranasal sinus mucosal thickening without air-fluid levels. Mastoid air cells appear well-aerated. Soft tissue within the external auditory canals likely reflects cerumen. Suspected proptosis, recommend clinical correlation.  IMPRESSION: Contracting left temporal parietal intraparenchymal hematoma, no rebleed.  Moderate white matter changes suggest chronic small vessel ischemic disease.   Electronically Signed   By: Elon Alas   On: 10/03/2013 05:18   Portable Chest Xray In Am  10/04/2013   CLINICAL DATA:  Cerebral infarction. Nontraumatic intracranial hemorrhage. Acute respiratory failure.  EXAM: PORTABLE CHEST - 1 VIEW  COMPARISON:  10/03/2013  FINDINGS: Improved aeration noted in the right lung base. Persistent left retrocardiac atelectasis or consolidation demonstrated. Cardiomegaly is stable. Diffuse interstitial prominence again noted and mild interstitial edema cannot be excluded. No pneumothorax identified.  IMPRESSION: Decreased right basilar atelectasis. Otherwise no significant change.   Electronically Signed   By: Earle Gell M.D.   On: 10/04/2013 07:20   Dg  Chest Portable 1 View  10/03/2013   CLINICAL DATA:  Intubation.  EXAM: PORTABLE CHEST - 1 VIEW  COMPARISON:  Chest 10/03/2013 at 5:32 a.m.  FINDINGS: Endotracheal tube is in place with tip in good position 3.9 cm above the carina. Left IJ catheter is again seen with the tip projecting over the mid superior vena cava. Right effusion basilar airspace disease persist. There is increased left basilar atelectasis. There is cardiomegaly. No pneumothorax.  IMPRESSION: ET tube in good position.  No change in right effusion and basilar airspace disease. Left basilar atelectasis is noted.   Electronically Signed   By: Inge Rise M.D.   On: 10/03/2013 20:22   Portable Chest Xray In Am  10/03/2013   CLINICAL DATA:  Hypoxia  EXAM: PORTABLE CHEST - 1 VIEW  COMPARISON:  October 02, 2013  FINDINGS: Endotracheal tube tip is 3.9 cm above the carina. Nasogastric tube tip and side port are in the stomach. Left jugular catheter tip is in the superior vena cava. No pneumothorax.  There is a right pleural effusion with right base atelectasis. Lungs are otherwise clear. Heart is mildly enlarged with normal pulmonary vascularity. Pacemaker leads are attached to the right atrium and right ventricle.  IMPRESSION: Fairly small right pleural effusion with right base atelectasis. Lungs are otherwise clear. Tube and catheter positions as described without pneumothorax.   Electronically Signed   By: Lowella Grip M.D.   On: 10/03/2013 07:05   Dg Abd Portable 1v  10/04/2013   CLINICAL DATA:  Status post gastric catheter placement  EXAM: PORTABLE ABDOMEN - 1 VIEW  COMPARISON:  10/04/2013 603  hrs  FINDINGS: A nasogastric catheter is again noted within the stomach. It is directed towards of pyloric channel. Chronic changes in the left base are again seen.  IMPRESSION: Gastric catheter within the distal stomach.  These results were called by telephone at the time of interpretation on 10/04/2013 at 10:35 AM to Margaretha Sheffield, the patient's  nurse who verbally acknowledged these results.   Electronically Signed   By: Inez Catalina M.D.   On: 10/04/2013 10:35   Dg Abd Portable 1v  10/04/2013   CLINICAL DATA:  Orogastric tube placement.  EXAM: PORTABLE ABDOMEN - 1 VIEW  COMPARISON:  DG ABD PORTABLE 1V dated 10/02/2013  FINDINGS: Orogastric tube tip noted below the left hemidiaphragm in the upper portion of the left upper quadrant. Basilar atelectasis. Cardiomegaly. No free air.  IMPRESSION: 1. An orogastric tube noted with its tip below the left hemidiaphragm.  2.  Basilar atelectasis.  3. Cardiomegaly .   Electronically Signed   By: Marcello Moores  Register   On: 10/04/2013 07:29    EKG: A-paced, age-indeterminate anteroseptal infarct  Echo: Study Conclusions  - Left ventricle: The cavity size was normal. Wall thickness was increased in a pattern of mild LVH. The estimated ejection fraction was 60%. Wall motion was normal; there were no regional wall motion abnormalities. - Aortic valve: Moderately thickened leaflets. There was mild stenosis. Mean gradient: 15mm Hg (S). Peak gradient: 108mm Hg (S). - Left atrium: The atrium was mildly dilated. - Right ventricle: The cavity size was mildly dilated. Pacer wire or catheter noted in right ventricle. Systolic function was mildly reduced. There was a mass. - Right atrium: There was a massin the atrial cavity. - Tricuspid valve: There was a mass. - Impressions: There is a mass present in the right atrium and right ventricle. It is not seen in the IVC. It was not present on the study of May, 2014. Considering all factors, the most likely etiology is clot. The mass is seen in the RA. There is also mass that moves across the tricuspid valve. There is probably also mass in the RV cavity. I can not be sure, but all of these targets might be connected. Impressions:  - There is a mass present in the right atrium and right ventricle. It is not seen in the IVC. It was not present on the study of  May, 2014. Considering all factors, the most likely etiology is clot. The mass is seen in the RA. There is also mass that moves across the tricuspid valve. There is probably also mass in the RV cavity. I can not be sure, but all of these targets might be connected.  ASSESSMENT AND PLAN:  1. Right atrial mass. Mobile mass crosses the tricuspid valve. I have personally reviewed the patient's echo images with Dr Sallyanne Kuster. It appears the mass is attached to his pacer wire. Diff dx includes thrombus or vegetation. Think thrombus is most likely. No clear signs of infectious endocarditis, but could be a link between cardiac thrombus or vegetation and CNS hematoma. Blood cx's x 2 drawn 2/18 and currently pending.   Treatment options are extremely limited. He is not a candidate for anticoagulation with acute intracerebral hematoma. Will check with EP colleagues but I don't think lead can/should be extracted if this is acute thrombus. If cx's positive will need to consider lead extraction/IV Abx. Also, agree with CTA chest to eval for pulmonary emboli at which patient is at extremely high-risk.   2. PAF - currently a-paced. Recent  pacer interrogation confirmed absence of AF. No anticoagulation as above.    [name] 10/04/2013, 11:50 AM

## 2013-10-04 NOTE — Progress Notes (Signed)
TTE demonstrated thrombus extending form RA to RV Chest CTA reveals RLL pulmonary embolus  Cardiology aware >>> Thrombus is around pacer wire Discussed with IR >>> Catheter guided lysis contraindicated Discussed with TCTS >>> Not a candidate for thrombectomy as requires systemic heparinization for CPB Unable to anticoagulate as recent intracranial hemorrhage    Doree Fudge, MD [facility] [place] Pager: [phone]

## 2013-10-04 NOTE — Progress Notes (Signed)
NUTRITION FOLLOW UP  DOCUMENTATION CODES  Per approved criteria  -Obesity Unspecified   Intervention:    Start vital High protein at 20 ml/hr with Prostat 60 ml QID  TF regimen provides 1280 kcal, 162 grams protein, and 401 ml free water daily  Above TF regimen and current propofol provides 2109 kcal (26 kcal/kg ideal body weight)  MVI daily  Nutrition Dx:   Inadequate oral intake related to inability to eat as evidenced by NPO status, ongoing.  Goal:   Enteral nutrition to provide 60-70% of estimated calorie needs (22-25 kcals/kg ideal body weight) and 100% of estimated protein needs, based on ASPEN guidelines for permissive underfeeding in critically ill obese individuals, met.  Monitor:   Vent status, TF initiation and tolerance, weight trend, labs  Assessment:   Pt with multiple medical co-morbidities (uncontrolled DM) who lives with a roommate admitted with altered mental status. Pt with left temp/parietal ICH. Pt desaturated and was intubated.   Patient extubated 2/18. Patient re-intubated later on 2/18 due to respiratory failure. RD team received consult for TF initiation and management.  Patient is currently intubated on ventilator support.  MV:10.9 L/min Temp (24hrs), Avg:98.8 F (37.1 C), Min:98.1 F (36.7 C), Max:99.7 F (37.6 C) Propofol: 31.4 ml/hr providing 829 kcal daily  Height: Ht Readings from Last 1 Encounters:  10/02/13 6' (1.829 m)    Weight Status:   Wt Readings from Last 1 Encounters:  10/03/13 283 lb 4.7 oz (128.5 kg)  10/02/13          289 lb   Body mass index is 38.41 kg/(m^2).   Re-estimated needs:  Kcal: 2308 Protein: >162 grams Fluid: >2 L  Skin: no wounds  Diet Order: NPO   Intake/Output Summary (Last 24 hours) at 10/04/13 1331 Last data filed at 10/04/13 0700  Gross per 24 hour  Intake 1222.87 ml  Output   1475 ml  Net -252.13 ml    Last BM: PTA   Labs:   Recent Labs Lab 10/02/13 0500 10/03/13 0500  10/04/13 0758  NA 138 139 140  K 3.9 4.2 3.3*  CL 101 103 95*  CO2 $Re'25 25 22  'leB$ BUN $R'11 22 16  'uo$ CREATININE 1.39* 1.55* 1.38*  CALCIUM 8.8 8.8 8.4  GLUCOSE 160* 146* 122*    CBG (last 3)   Recent Labs  10/04/13 0344 10/04/13 0732 10/04/13 1201  GLUCAP 108* 99 102*    Scheduled Meds: . amiodarone  200 mg Oral Daily  . antiseptic oral rinse  15 mL Mouth Rinse QID  . atorvastatin  10 mg Oral q1800  . brimonidine  1 drop Both Eyes TID  . carvedilol  25 mg Per Tube BID WC  . chlorhexidine  15 mL Mouth Rinse BID  . cloNIDine  0.1 mg Per Tube BID  . dorzolamide-timolol  1 drop Both Eyes BID  . enoxaparin (LOVENOX) injection  40 mg Subcutaneous Q24H  . feeding supplement (PRO-STAT SUGAR FREE 64)  60 mL Per Tube QID  . insulin aspart  0-15 Units Subcutaneous 6 times per day  . isosorbide mononitrate  30 mg Oral Daily  . magnesium oxide  400 mg Oral Daily  . multivitamin  5 mL Oral Daily  . pantoprazole (PROTONIX) IV  40 mg Intravenous QHS  . piperacillin-tazobactam (ZOSYN)  IV  3.375 g Intravenous Q8H  . pneumococcal 23 valent vaccine  0.5 mL Intramuscular Tomorrow-1000  . polyethylene glycol  17 g Oral Daily  . polyvinyl alcohol  1 drop  Both Eyes BID  . senna-docusate  1 tablet Oral BID  . vancomycin  1,250 mg Intravenous Q12H    Continuous Infusions: . sodium chloride 20 mL/hr at 10/03/13 0700  . propofol 40 mcg/kg/min (10/04/13 0700)    Claudell Kyle, Dietetic Intern Pager: [phone]    Intern note/chart reviewed. Revisions made.  [place], [place], [place] Pager [phone] After Hours Pager

## 2013-10-04 NOTE — Progress Notes (Signed)
PULMONARY / CRITICAL CARE MEDICINE  Name: [name] MRN: [id number] DOB: [birth year]    ADMISSION DATE:  10/01/2013 CONSULTATION DATE:  10/02/2013  REFERRING MD :  Stroke PRIMARY SERVICE: Stroke  CHIEF COMPLAINT:  Acute respiratory failure   BRIEF PATIENT DESCRIPTION: 68 year old male with multiple co-morbids including suspected OSA/OHS. Admitted for left temporal / parietal ICH on 2/16. Developed decrease LOC and progressive respiratory failure early in the am on 2/17. PCCM asked to see emergently in consult.   SIGNIFICANT EVENTS / STUDIES:  2/16  Head CT >>> 1. 4 x 3 cm left temporal/parietal parenchymal hematoma. 2. 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 3. Diffuse inflammatory paranasal sinus disease. 2/17  Head CT >>>  Evolving left temporoparietal intraparenchymal hematoma with similar mass effect. No new hemorrhage. 2/18  Head CT >>>  Contracting left temporal parietal intraparenchymal hematoma, no rebleed. Moderate white matter changes suggest chronic small vessel ischemic disease. 2/18  Extubated / re-intubated due to pulmonary edema vs aspiration pneumonia 2/19  TTE >>>  LINES / TUBES: L IJ CVL 2/17 >>> OETT 2/17 >>> 2/18; 2/18 >>> OGT 1/17 >>> 2/18; 2/18 >>> Foley 2/17 >>>  CULTURES:  ANTIBIOTICS: Zosyn 2/18 >>> Vancomycin 2/18 >>>  SUBJECTIVE:  Events of last night noted.  VITAL SIGNS: Temp:  [98.1 F (36.7 C)-99.7 F (37.6 C)] 99.7 F (37.6 C) (02/19 0345) Pulse Rate:  [70-88] 73 (02/19 0600) Resp:  [0-31] 18 (02/19 0600) BP: (95-186)/(54-137) 164/93 mmHg (02/19 0600) SpO2:  [88 %-100 %] 100 % (02/19 0600) FiO2 (%):  [40 %-100 %] 40 % (02/19 0400)  HEMODYNAMICS:   VENTILATOR SETTINGS: Vent Mode:  [-] PRVC FiO2 (%):  [40 %-100 %] 40 % Set Rate:  [16 bmp] 16 bmp Vt Set:  [670 mL] 670 mL PEEP:  [8 cmH20] 8 cmH20 Plateau Pressure:  [20 [id number] cmH20] 21 cmH20  INTAKE / OUTPUT: Intake/Output     02/18 0701 - 02/19 0700 02/19  0701 - 02/20 0700   I.V. (mL/kg) 642.1 (5)    Other     NG/GT     IV Piggyback 637.5    Total Intake(mL/kg) 1279.6 (10)    Urine (mL/kg/hr) 1215 (0.4)    Total Output 1215     Net +64.6           PHYSICAL EXAMINATION: General:  Mechanically ventilated, synchronous Neuro:  Sedated, gag / cough diminished HEENT:  OETT / OGT Cardiovascular:  Regular, no murmurs Lungs:  Bilateral air entry, few rhonchi Abdomen:  Obese  Musculoskeletal:  Trace edema Skin:  Intact   LABS:  CBC  Recent Labs Lab 10/01/13 1504 10/01/13 1519 10/02/13 0500 10/03/13 0500  WBC 8.0  --  8.7 12.4*  HGB 11.2* 12.9* 10.8* 11.2*  HCT 34.8* 38.0* 33.0* 34.5*  PLT 220  --  245 179   Coag's  Recent Labs Lab 10/01/13 1504  APTT 32  INR 1.10   BMET  Recent Labs Lab 10/01/13 1504 10/01/13 1519 10/02/13 0500 10/03/13 0500  NA 137 138 138 139  K 4.0 3.8 3.9 4.2  CL 100 100 101 103  CO2 25  --  25 25  BUN 11 9 11 22   CREATININE 1.10 1.20 1.39* 1.55*  GLUCOSE 185* 186* 160* 146*   Electrolytes  Recent Labs Lab 10/01/13 1504 10/02/13 0500 10/03/13 0500  CALCIUM 9.0 8.8 8.8   Sepsis Markers No results found for this basename: LATICACIDVEN, PROCALCITON, O2SATVEN,  in the last 168 hours  ABG  Recent Labs Lab 10/02/13 0001 10/02/13 0209 10/02/13 0353  PHART 7.403 7.197* 7.313*  PCO2ART 33.9* 74.2* 49.3*  PO2ART 144.0* 74.5* 90.5   Liver Enzymes  Recent Labs Lab 10/01/13 1504  AST 51*  ALT 77*  ALKPHOS 143*  BILITOT 0.2*  ALBUMIN 2.6*   Cardiac Enzymes  Recent Labs Lab 10/01/13 1504  TROPONINI <0.30   Glucose  Recent Labs Lab 10/03/13 1221 10/03/13 1636 10/03/13 2019 10/03/13 2125 10/03/13 2337 10/04/13 0344  GLUCAP 130* 130* 125* 106* 111* 108*   Imaging Ct Head Wo Contrast  10/03/2013   CLINICAL DATA:  Follow-up evaluation.  EXAM: CT HEAD WITHOUT CONTRAST  TECHNIQUE: Contiguous axial images were obtained from the base of the skull through the vertex  without intravenous contrast.  COMPARISON:  CT PORT HEAD WO CM dated 10/02/2013; CT HEAD W/O CM dated 10/01/2013  FINDINGS: Left temporal parietal 2.7 x 3 point cm a intraparenchymal hematoma was 2.8 x 4 cm. Similar surrounding low-density vasogenic edema with mild mass effect.  The ventricles and sulci are overall normal for patient's age. Mild prominence of the cerebellar folia, somewhat advanced for age. Patchy supratentorial white matter hypodensities exclusive of the vasogenic edema suggests chronic small vessel ischemic disease.  No abnormal extra-axial fluid collections. Basal cisterns are patent. Dolichoectasia of the basilar artery may reflect chronic hypertension. Moderate calcific atherosclerosis of the intracranial vessels.  Chronic paranasal sinus mucosal thickening without air-fluid levels. Mastoid air cells appear well-aerated. Soft tissue within the external auditory canals likely reflects cerumen. Suspected proptosis, recommend clinical correlation.  IMPRESSION: Contracting left temporal parietal intraparenchymal hematoma, no rebleed.  Moderate white matter changes suggest chronic small vessel ischemic disease.   Electronically Signed   By: Elon Alas   On: 10/03/2013 05:18   Dg Chest Portable 1 View  10/03/2013   CLINICAL DATA:  Intubation.  EXAM: PORTABLE CHEST - 1 VIEW  COMPARISON:  Chest 10/03/2013 at 5:32 a.m.  FINDINGS: Endotracheal tube is in place with tip in good position 3.9 cm above the carina. Left IJ catheter is again seen with the tip projecting over the mid superior vena cava. Right effusion basilar airspace disease persist. There is increased left basilar atelectasis. There is cardiomegaly. No pneumothorax.  IMPRESSION: ET tube in good position.  No change in right effusion and basilar airspace disease. Left basilar atelectasis is noted.   Electronically Signed   By: Inge Rise M.D.   On: 10/03/2013 20:22   Portable Chest Xray In Am  10/03/2013   CLINICAL DATA:   Hypoxia  EXAM: PORTABLE CHEST - 1 VIEW  COMPARISON:  October 02, 2013  FINDINGS: Endotracheal tube tip is 3.9 cm above the carina. Nasogastric tube tip and side port are in the stomach. Left jugular catheter tip is in the superior vena cava. No pneumothorax.  There is a right pleural effusion with right base atelectasis. Lungs are otherwise clear. Heart is mildly enlarged with normal pulmonary vascularity. Pacemaker leads are attached to the right atrium and right ventricle.  IMPRESSION: Fairly small right pleural effusion with right base atelectasis. Lungs are otherwise clear. Tube and catheter positions as described without pneumothorax.   Electronically Signed   By: Lowella Grip M.D.   On: 10/03/2013 07:05   Dg Abd Portable 1v  10/02/2013   CLINICAL DATA:  Orogastric tube placement  EXAM: PORTABLE ABDOMEN - 1 VIEW  COMPARISON:  None.  FINDINGS: Orogastric tube tip and side port are in the stomach. Bowel gas pattern is  overall unremarkable. No free air seen on this supine examination.  IMPRESSION: Orogastric tube tip and side port in stomach.   Electronically Signed   By: Lowella Grip M.D.   On: 10/02/2013 10:03   ASSESSMENT / PLAN:  PULMONARY A:  Acute respiratory failure, re intubated 2/18 Likely acute pulmonary edema given interstitial infiltrates on CXR Less likely pneumonia Suspected OSA/OHS P:   Goal pH>7.30, SpO2>92 Continuous mechanical support VAP bundle Daily SBT Trend ABG/CXR  CARDIOVASCULAR A:   HTN Chronic diastolic heart failure Transient hypotension after sedation / intubation, resolved Hypertensive last night in association with respiratory distress, not clear if primary or secondary problem  H/o CAD, AF, non-ischemic cardiomyopathy P:  Goal SBP <160  Continue Amiodarone, Lipitor, Coreg, Clonidine, Imdur  Hold ASA Labetalol PRN Echo  RENAL A:   CKD stage 3 P:   Trend BMP Lasix 60 x 1 by eMD  GASTROINTESTINAL A:   Obesity Mild elevated LFTs   GERD on PPI preadmission Nutrition High OGF P:   Advance OGT, recheck xray TF per Nutritionist Protonix Check LFT  HEMATOLOGIC A:   Mild anemia  P:  Trend CBC   INFECTIOUS A:   Suspected aspiration pneumonia P:   Abx as above Check PCT  ENDOCRINE A:   DM  P:   SSI  NEUROLOGIC A: Left temp/parietal ICH  Acute encephalopathy P:   Per stroke team Goal RASS 0 to -1 Propofol gtt Fentanyl PRN  I have personally obtained history, examined patient, evaluated and interpreted laboratory and imaging results, reviewed medical records, formulated assessment / plan and placed orders.  CRITICAL CARE:  The patient is critically ill with multiple organ systems failure and requires high complexity decision making for assessment and support, frequent evaluation and titration of therapies, application of advanced monitoring technologies and extensive interpretation of multiple databases. Critical Care Time devoted to patient care services described in this note is 35 minutes.   Doree Fudge, MD Pulmonary and [facility] Pager: [phone]  10/04/2013, 7:21 AM

## 2013-10-04 NOTE — Progress Notes (Signed)
SLP Cancellation Note  Patient Details Name: [name] MRN: [id number] DOB: 03/27/1946   Cancelled treatment:        Pt. reintubated last night.  Will check status tomorrow.   Orbie Pyo [place].[employer] [phone]  10/04/2013

## 2013-10-04 NOTE — Progress Notes (Signed)
OT Cancellation Note  Patient Details Name: [name] MRN: [id number] DOB: 1945-12-12   Cancelled Treatment:    Reason Eval/Treat Not Completed: Patient not medically ready (Reintubated 2/18. Will assess wen medically appropriate.)  [place], OTR/L  [id number] 10/04/2013 10/04/2013, 8:20 AM

## 2013-10-04 NOTE — Plan of Care (Signed)
Problem: Progression Outcomes Goal: If vent dependent, tolerates weaning Outcome: Not Met (add Reason) Had to be reintubated early last night due to increased difficulty of breathing, and desaturation in the 70-80s.

## 2013-10-04 NOTE — Progress Notes (Signed)
  Echocardiogram 2D Echocardiogram has been performed.  JAQUE, BAHN 10/04/2013, 9:03 AM

## 2013-10-04 NOTE — Progress Notes (Signed)
PT Cancellation Note  Patient Details Name: [name] MRN: [id number] DOB: 04-27-1946   Cancelled Treatment:    Reason Eval/Treat Not Completed: Patient not medically ready. Pt re intubated last night. Will recheck with pt when medically stable and appropriate.    [name] , [place]  10/04/2013, 7:58 AM

## 2013-10-04 NOTE — Progress Notes (Signed)
Stroke Team Progress Note  HISTORY [name] is an 68 y.o. male who lives with a roommate. Roommate felt that patient was not at his baseline today and this morning felt hat he was hallucinating. He went to his day program where they noticed that he was not talking right. EMS was called at that time. Last known well 09/30/2013 at 22:30. Patient was not administerd TPA secondary to delay in arrival. He was admitted to the neuro ICU for further evaluation and treatment.  SUBJECTIVE Patient's daughter is at bedside. She thinks he was re-intubated because he got upset when he realized he was tied down.  OBJECTIVE Most recent Vital Signs: Filed Vitals:   10/04/13 0600 10/04/13 0700 10/04/13 0737 10/04/13 0800  BP: 164/93 156/80 156/80 164/85  Pulse: 73 70 76 70  Temp:  99.2 F (37.3 C)    TempSrc:  Axillary    Resp: 18 16 17 16   Height:      Weight:      SpO2: 100% 100% 100% 100%   CBG (last 3)   Recent Labs  10/03/13 2337 10/04/13 0344 10/04/13 0732  GLUCAP 111* 108* 99    IV Fluid Intake:   . sodium chloride 20 mL/hr at 10/03/13 0700  . propofol 40 mcg/kg/min (10/04/13 0700)    MEDICATIONS  . amiodarone  200 mg Oral Daily  . antiseptic oral rinse  15 mL Mouth Rinse QID  . atorvastatin  10 mg Oral q1800  . brimonidine  1 drop Both Eyes TID  . carvedilol  25 mg Per Tube BID WC  . chlorhexidine  15 mL Mouth Rinse BID  . cloNIDine  0.1 mg Per Tube BID  . dorzolamide-timolol  1 drop Both Eyes BID  . feeding supplement (PRO-STAT SUGAR FREE 64)  60 mL Per Tube QID  . insulin aspart  0-15 Units Subcutaneous 6 times per day  . isosorbide mononitrate  30 mg Oral Daily  . magnesium oxide  400 mg Oral Daily  . multivitamin  5 mL Oral Daily  . pantoprazole (PROTONIX) IV  40 mg Intravenous QHS  . piperacillin-tazobactam (ZOSYN)  IV  3.375 g Intravenous Q8H  . pneumococcal 23 valent vaccine  0.5 mL Intramuscular Tomorrow-1000  . polyethylene glycol  17 g Oral Daily  .  polyvinyl alcohol  1 drop Both Eyes BID  . senna-docusate  1 tablet Oral BID  . vancomycin  1,250 mg Intravenous Q12H   PRN:  acetaminophen, acetaminophen, fentaNYL, labetalol  Diet:  NPO  Activity:  As tolerated DVT Prophylaxis:  SCDs   CLINICALLY SIGNIFICANT STUDIES Basic Metabolic Panel:   Recent Labs Lab 10/02/13 0500 10/03/13 0500  NA 138 139  K 3.9 4.2  CL 101 103  CO2 25 25  GLUCOSE 160* 146*  BUN 11 22  CREATININE 1.39* 1.55*  CALCIUM 8.8 8.8   Liver Function Tests:   Recent Labs Lab 10/01/13 1504  AST 51*  ALT 77*  ALKPHOS 143*  BILITOT 0.2*  PROT 7.3  ALBUMIN 2.6*   CBC:   Recent Labs Lab 10/01/13 1504  10/03/13 0500 10/04/13 0758  WBC 8.0  < > 12.4* 13.3*  NEUTROABS 4.8  --   --   --   HGB 11.2*  < > 11.2* 10.9*  HCT 34.8*  < > 34.5* 33.6*  MCV 80.9  < > 80.4 80.8  PLT 220  < > 179 143*  < > = values in this interval not displayed. Coagulation:   Recent Labs  Lab 10/01/13 1504  LABPROT 14.0  INR 1.10   Cardiac Enzymes:   Recent Labs Lab 10/01/13 1504  TROPONINI <0.30   Urinalysis:   Recent Labs Lab 10/01/13 1915  COLORURINE YELLOW  LABSPEC 1.013  PHURINE 7.5  GLUCOSEU NEGATIVE  HGBUR NEGATIVE  BILIRUBINUR NEGATIVE  KETONESUR NEGATIVE  PROTEINUR NEGATIVE  UROBILINOGEN 0.2  NITRITE NEGATIVE  LEUKOCYTESUR NEGATIVE   Lipid Panel    Component Value Date/Time   CHOL 171 06/29/2010   TRIG 144 10/04/2013 0025   HDL 32 06/29/2010   CHOLHDL 6.1 Ratio 05/15/2009 2055   VLDL 44* 05/15/2009 2055   LDLCALC 118 06/29/2010   HgbA1C  Lab Results  Component Value Date   HGBA1C 8.2* 10/02/2013    Urine Drug Screen:     Component Value Date/Time   LABOPIA NONE DETECTED 10/01/2013 1915   COCAINSCRNUR NONE DETECTED 10/01/2013 1915   COCAINSCRNUR NEG 12/16/2008 2246   LABBENZ NONE DETECTED 10/01/2013 1915   LABBENZ NEG 12/16/2008 2246   AMPHETMU NONE DETECTED 10/01/2013 1915   AMPHETMU NEG 12/16/2008 2246   THCU NONE DETECTED 10/01/2013  1915   LABBARB NONE DETECTED 10/01/2013 1915    Alcohol Level:   Recent Labs Lab 10/01/13 1504  ETH <11   CT of the brain   10/05/2013 10/03/2013 Contracting left temporal parietal intraparenchymal hematoma, no rebleed. Moderate white matter changes suggest chronic small vessel ischemic disease. 10/02/2013   Limited portable CT: Skullbase and posterior fossa not been included.  Evolving left temporoparietal intraparenchymal hematoma with similar mass effect. No new hemorrhage.   10/01/2013    1. 4 x 3 cm left temporal/parietal parenchymal hematoma. 2. 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 3. Diffuse inflammatory paranasal sinus disease.     MRI/MRA head pacemaker  CXR   10/04/2013 Decreased right basilar atelectasis. Otherwise no significant  change. 10/03/2013 Fairly small right pleural effusion with right base atelectasis. Lungs are otherwise clear. Tube and catheter positions as described without pneumothorax. 10/02/2013    Endotracheal tube 4 cm above the carina. Left central line tip projected laterally towards the lateral wall of the upper SVC. No pneumothorax.  Cardiomegaly with vascular congestion.  Small effusions and bibasilar atelectasis.    10/01/2013   The appearance of the pulmonary interstitium suggests mild interstitial edema likely secondary to CHF. There is no evidence of pneumonia or pleural effusion.     2D Echo prelimary report - Right atrial clot  EKG  Sinus or ectopic atrial rhythm. Prolonged PR interval. Anterior infarct, old. No significant change was found.   Therapy Recommendations   Physical Exam   Middle aged male intubated   . Afebrile. Head is nontraumatic. Neck is supple without bruit. Hearing is normal. Cardiac exam no murmur or gallop. Lungs are clear to auscultation. Distal pulses are well felt. Neurological Exam : Sleepy but can be aroused. Eyes are closed. Pupils 6 mm reactive. Spontaneous side-to-side eye movements are present. Fundi  were not visualized. No facial weakness. Tongue midline. Able to move all 4 extremities well against gravity left side greater than right. No focal weakness. Does not follow commands. Deep tendon reflexes 1+ symmetric. Plantars downgoing.  ASSESSMENT [name] is a 68 y.o. male presenting with altered speech. Imaging confirms a left temporal parietal IPH with significant surrounding edema - no hydrocephalus, no [name]. Suspect underlying mass or ischemic infarct given amt of edema. Will need MRI to confirm. Hemmorrhage stable. Mostly likely developed heart failure; not a respiratory issue. Patient  re-intubated due to pulmonary edema vs aspiration pneumonia. On aspirin 81 mg orally every day prior to admission. Patient purposeful, localizes; global aphasia, right hemirparesis, dysphagia.  Right atrial clot, has IJ, it was placed at time of initial intubation and also has pacer leads- unclear as to what we can do due to recent [place] he is not a anticoagulation canadidate Respiratory failure, intubated  due to respiratory insufficiency, low sats after admission. Extubated/reintubated 2/18 am due to pulmonary edema vs aspiration pneumonia. Started on vanc and zosyn. Plan to continue intubation for now.  atrial fibrillation, not a coumadin candidate secondary to rectal bleeding hypertension Hyperlipidemia, on crestor 40 mg daily PTA, now on no statin as NPO Diabetes, uncontrolled, HgbA1c 8.2, goal 7.0 obstructive sleep apnea Obesity, Body mass index is 38.41 kg/(m^2).   Pacemaker  Legally blind  Herpes  Hospital day # 3  TREATMENT/PLAN  F/u 2D final results  Cardiology consult - CHF and echo findings  CT head in am  SBP goal to < 180.  lovenox for VTE prophylaxis  Likely SNF  Check lipids, carotid doppler  Long d/w CCM MD, daughter and cardiology MD  Burnetta Sabin, MSN, RN, ANVP-BC, ANP-BC, GNP-BC [facility] Pager: [phone] 10/04/2013 8:50 AM This patient  is critically ill and at significant risk of neurological worsening, death and care requires constant monitoring of vital signs, hemodynamics,respiratory and cardiac monitoring,review of multiple databases, neurological assessment, discussion with family, other specialists and medical decision making of high complexity. I spent 60 minutes of neurocritical care time  in the care of  this patient. I have personally obtained a history, examined the patient, evaluated imaging results, and formulated the assessment and plan of care. I agree with the above.  Antony Contras, MD

## 2013-10-04 NOTE — Progress Notes (Signed)
Patients ABG didn't pull over with the correct date. The following is the results for the ABG done post intubation on 10/03/2013 : Arterial Blood Gas result:  pO2 144; pCO2 33.9; pH 7.403;  HCO3 20.7, %O2 Sat 99.1. This ABG was taken on the following ventilator settings:  Vt        670 RR      16 FIO2    60% PEEP  8  Oxygen was titrated down to 50% at this time.

## 2013-10-05 ENCOUNTER — Inpatient Hospital Stay (HOSPITAL_COMMUNITY): Payer: Medicare Other

## 2013-10-05 DIAGNOSIS — I5189 Other ill-defined heart diseases: Secondary | ICD-10-CM

## 2013-10-05 LAB — BASIC METABOLIC PANEL
BUN: 19 mg/dL (ref 6–23)
CO2: 24 mEq/L (ref 19–32)
Calcium: 8.6 mg/dL (ref 8.4–10.5)
Chloride: 104 mEq/L (ref 96–112)
Creatinine, Ser: 1.18 mg/dL (ref 0.50–1.35)
GFR calc Af Amer: 73 mL/min — ABNORMAL LOW (ref >90)
GFR, EST NON AFRICAN AMERICAN: 63 mL/min — AB (ref >90)
GLUCOSE: 130 mg/dL — AB (ref 70–99)
POTASSIUM: 3.3 meq/L — AB (ref 3.7–5.3)
Sodium: 142 mEq/L (ref 137–147)

## 2013-10-05 LAB — CBC
HCT: 33.4 % — ABNORMAL LOW (ref 39.0–52.0)
HEMOGLOBIN: 10.6 g/dL — AB (ref 13.0–17.0)
MCH: 25.7 pg — ABNORMAL LOW (ref 26.0–34.0)
MCHC: 31.7 g/dL (ref 30.0–36.0)
MCV: 80.9 fL (ref 78.0–100.0)
Platelets: 143 10*3/uL — ABNORMAL LOW (ref 150–400)
RBC: 4.13 MIL/uL — AB (ref 4.22–5.81)
RDW: 15.9 % — ABNORMAL HIGH (ref 11.5–15.5)
WBC: 11.4 10*3/uL — ABNORMAL HIGH (ref 4.0–10.5)

## 2013-10-05 LAB — GLUCOSE, CAPILLARY
GLUCOSE-CAPILLARY: 160 mg/dL — AB (ref 70–99)
Glucose-Capillary: 123 mg/dL — ABNORMAL HIGH (ref 70–99)
Glucose-Capillary: 142 mg/dL — ABNORMAL HIGH (ref 70–99)
Glucose-Capillary: 142 mg/dL — ABNORMAL HIGH (ref 70–99)
Glucose-Capillary: 147 mg/dL — ABNORMAL HIGH (ref 70–99)
Glucose-Capillary: 153 mg/dL — ABNORMAL HIGH (ref 70–99)
Glucose-Capillary: 165 mg/dL — ABNORMAL HIGH (ref 70–99)

## 2013-10-05 LAB — LIPID PANEL
CHOLESTEROL: 134 mg/dL (ref 0–200)
HDL: 37 mg/dL — ABNORMAL LOW (ref >39)
LDL Cholesterol: 67 mg/dL (ref 0–99)
TRIGLYCERIDES: 149 mg/dL (ref <150)
Total CHOL/HDL Ratio: 3.6 RATIO
VLDL: 30 mg/dL (ref 0–40)

## 2013-10-05 LAB — TRIGLYCERIDES: Triglycerides: 152 mg/dL — ABNORMAL HIGH (ref <150)

## 2013-10-05 LAB — PROCALCITONIN: PROCALCITONIN: 0.19 ng/mL

## 2013-10-05 MED ORDER — SODIUM CHLORIDE 0.9 % IV SOLN: INTRAVENOUS | Status: DC

## 2013-10-05 MED ORDER — POTASSIUM CHLORIDE 20 MEQ/15ML (10%) PO LIQD
20.0000 meq | ORAL | Status: AC
  Administered 2013-10-05 (×2): 20 meq
  Filled 2013-10-05 (×2): qty 15

## 2013-10-05 NOTE — Progress Notes (Signed)
PULMONARY / CRITICAL CARE MEDICINE  Name: [name] MRN: [id number] DOB: October 27, 1945    ADMISSION DATE:  10/01/2013 CONSULTATION DATE:  10/02/2013  REFERRING MD :  Stroke PRIMARY SERVICE: Stroke  CHIEF COMPLAINT:  Acute respiratory failure   BRIEF PATIENT DESCRIPTION: 68 year old male with multiple co-morbids including suspected OSA/OHS. Admitted for left temporal / parietal ICH on 2/16. Developed decrease LOC and progressive respiratory failure early in the am on 2/17. PCCM asked to see emergently in consult.   SIGNIFICANT EVENTS / STUDIES:  2/16  Head CT >>> 1. 4 x 3 cm left temporal/parietal parenchymal hematoma. 2. 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 3. Diffuse inflammatory paranasal sinus disease. 2/17  Head CT >>>  Evolving left temporoparietal intraparenchymal hematoma with similar mass effect. No new hemorrhage. 2/18  Head CT >>>  Contracting left temporal parietal intraparenchymal hematoma, no rebleed. Moderate white matter changes suggest chronic small vessel ischemic disease. 2/18  Extubated / re-intubated due to pulmonary edema vs aspiration pneumonia vs other 2/19  TTE >>> mobile mass in RA on the pacer wire, likely clot (vs vegetation) 2/19  CT-PA >> R LL acute PE, LUL and BLL atx vs infiltrates  LINES / TUBES: L IJ CVL 2/17 >>> OETT 2/17 >>> 2/18; 2/18 >>> OGT 1/17 >>> 2/18; 2/18 >>> Foley 2/17 >>>  CULTURES: Blood 2/18 >>  resp 2/19 >>   ANTIBIOTICS: Zosyn 2/18 >>> Vancomycin 2/18 >>>  SUBJECTIVE:   TTE and CT-PA completed Remains intubated, follows some commands  VITAL SIGNS: Temp:  [97.6 F (36.4 C)-98.4 F (36.9 C)] 97.6 F (36.4 C) (02/20 0741) Pulse Rate:  [55-79] 70 (02/20 0900) Resp:  [16-23] 23 (02/20 0900) BP: (99-181)/(67-104) 138/81 mmHg (02/20 0900) SpO2:  [100 %] 100 % (02/20 0900) FiO2 (%):  [40 %] 40 % (02/20 0807) Weight:  [126.3 kg (278 lb 7.1 oz)-127.7 kg (281 lb 8.4 oz)] 127.7 kg (281 lb 8.4 oz) (02/20  0500)  HEMODYNAMICS:   VENTILATOR SETTINGS: Vent Mode:  [-] CPAP;PSV FiO2 (%):  [40 %] 40 % Set Rate:  [16 bmp] 16 bmp Vt Set:  [670 mL] 670 mL PEEP:  [5 cmH20] 5 cmH20 Pressure Support:  [5 cmH20] 5 cmH20 Plateau Pressure:  [19 cmH20-28 cmH20] 22 cmH20  INTAKE / OUTPUT: Intake/Output     02/19 0701 - 02/20 0700 02/20 0701 - 02/21 0700   I.V. (mL/kg) 1285.1 (10.1) 81.2 (0.6)   NG/GT 340 40   IV Piggyback 650    Total Intake(mL/kg) 2275.1 (17.8) 121.2 (0.9)   Urine (mL/kg/hr) 1115 (0.4) 125 (0.4)   Total Output 1115 125   Net +1160.1 -3.8         PHYSICAL EXAMINATION: General:  Mechanically ventilated, synchronous Neuro:  Sedated, gag / cough diminished HEENT:  OETT / OGT Cardiovascular:  Regular, no murmurs Lungs:  Bilateral air entry, few rhonchi Abdomen:  Obese  Musculoskeletal:  Trace edema Skin:  Intact   LABS:  CBC  Recent Labs Lab 10/03/13 0500 10/04/13 0758 10/05/13 0500  WBC 12.4* 13.3* 11.4*  HGB 11.2* 10.9* 10.6*  HCT 34.5* 33.6* 33.4*  PLT 179 143* 143*   Coag's  Recent Labs Lab 10/01/13 1504  APTT 32  INR 1.10   BMET  Recent Labs Lab 10/03/13 0500 10/04/13 0758 10/05/13 0500  NA 139 140 142  K 4.2 3.3* 3.3*  CL 103 95* 104  CO2 25 22 24   BUN 22 16 19   CREATININE 1.55* 1.38* 1.18  GLUCOSE  146* 122* 130*   Electrolytes  Recent Labs Lab 10/03/13 0500 10/04/13 0758 10/05/13 0500  CALCIUM 8.8 8.4 8.6   Sepsis Markers  Recent Labs Lab 10/04/13 0758 10/05/13 0500  PROCALCITON 0.28 0.19    ABG  Recent Labs Lab 10/02/13 0001 10/02/13 0209 10/02/13 0353  PHART 7.403 7.197* 7.313*  PCO2ART 33.9* 74.2* 49.3*  PO2ART 144.0* 74.5* 90.5   Liver Enzymes  Recent Labs Lab 10/01/13 1504 10/04/13 0758  AST 51* 18  ALT 77* 30  ALKPHOS 143* 110  BILITOT 0.2* 0.7  ALBUMIN 2.6* 2.4*   Cardiac Enzymes  Recent Labs Lab 10/01/13 1504  TROPONINI <0.30   Glucose  Recent Labs Lab 10/04/13 1201 10/04/13 1546  10/04/13 2051 10/05/13 0021 10/05/13 0349 10/05/13 0739  GLUCAP 102* 142* 141* 147* 142* 123*   Imaging Ct Head Wo Contrast  10/05/2013   CLINICAL DATA:  Follow-up stroke.  EXAM: CT HEAD WITHOUT CONTRAST  TECHNIQUE: Contiguous axial images were obtained from the base of the skull through the vertex without intravenous contrast.  COMPARISON:  CT HEAD W/O CM dated 10/03/2013; CT PORT HEAD WO CM dated 10/02/2013  FINDINGS: Left temporoparietal 2.2 x 3.1 cm intraparenchymal hematoma was 2.7 x 3.8 cm. Minimal surrounding low-density vasogenic edema with mild local mass effect. No rebleed.  The ventricles and sulci are overall normal for patient's age. Mild disproportionate prominence of the cerebellar folia. Patchy supratentorial white matter hypodensities exclusive of the vasogenic edema suggest chronic small vessel ischemic disease, stable. No acute large vascular territory infarct.  No abnormal extra-axial fluid collections. Basal cisterns are patent. Dolicoectatic basilar artery suggest chronic hypertension with moderate calcific atherosclerosis of the intracranial vessels.  Chronic paranasal sinus mucosal thickening, similar. Mastoid air cells are well aerated. Soft tissue within the external auditory canal likely reflects cerumen. Suspected proptosis, recommend clinical correlation.  IMPRESSION: Contracting left temporoparietal intraparenchymal hematoma was 2.7 x 3.8 cm, now 2.2 x 3.1 cm without rebleed.   Electronically Signed   By: Elon Alas   On: 10/05/2013 04:31   Ct Angio Chest Pe W/cm &/or Wo Cm  10/04/2013   CLINICAL DATA:  Abnormal echocardiogram. Question pulmonary embolus.  EXAM: CT ANGIOGRAPHY CHEST WITH CONTRAST  TECHNIQUE: Multidetector CT imaging of the chest was performed using the standard protocol during bolus administration of intravenous contrast. Multiplanar CT image reconstructions and MIPs were obtained to evaluate the vascular anatomy.  CONTRAST:  15mL OMNIPAQUE IOHEXOL 350  MG/ML SOLN  COMPARISON:  One-view chest x-ray 10/04/2013.  FINDINGS: Pulmonary retro opacification is suboptimal. There is a significant filling defect within the right interlobar artery suggesting pulmonary embolus. There is some filling defect suspected in the right upper lobe as well on the of this area is degraded by patient breathing motion.  Scattered areas of airspace consolidation are noted. There is focal airspace disease in the posterior and lateral left upper lobe. Dependent airspace disease is present bilaterally. Small bilateral pleural effusions are noted.  The heart is enlarged. Atherosclerotic calcifications are present in the coronary artery in the descending aorta.  The bone windows with demonstrate no focal lytic or blastic lesions. An NG tube is in place. The patient is intubated.  Limited imaging in the upper abdomen is unremarkable.  Review of the MIP images confirms the above findings.  IMPRESSION: 1. Right lower lobar pulmonary embolus. 2. Left upper lobe on bilateral lower lobe airspace disease. While this likely represents atelectasis, early infection is not excluded. 3. Mild cardiomegaly and atherosclerotic disease. 4.  Support apparatus as described. Critical Value/emergent results were called by telephone at the time of interpretation on 10/04/2013 at 1:06 PM to Dr. Doree Fudge , who verbally acknowledged these results.   Electronically Signed   By: Lawrence Santiago M.D.   On: 10/04/2013 13:09   Dg Chest Port 1 View  10/05/2013   CLINICAL DATA:  Intubation.  EXAM: PORTABLE CHEST - 1 VIEW  COMPARISON:  Chest radiograph October 04, 2013  FINDINGS: Endotracheal tube tip projects 3.3 cm above the equina. Left internal jugular central venous catheter with distal tip projecting in proximal superior vena cava. Nasogastric tube past the gastroesophageal junction though, distal tip is not imaged. Dual lead right cardiac pacemaker in situ.  Stable at least moderate cardiomegaly, and  mediastinal silhouette is nonsuspicious, patient is rotated to the right. Right lung base volume loss, the cardiac silhouette is mildly shift of the right concerning for right middle lobe collapse. Mild patchy bibasilar airspace opacities with improved aeration of left lung base. Small pleural effusions. No pneumothorax.  Large body habitus. Osseous structures are nonsuspicious. No apparent change in position of life support lines.  IMPRESSION: Stable cardiomegaly, bibasilar airspace opacities with improved aeration of left lung base. Probable right middle lobe collapse. Small residual pleural effusions.   Electronically Signed   By: Elon Alas   On: 10/05/2013 06:15   Portable Chest Xray In Am  10/04/2013   CLINICAL DATA:  Cerebral infarction. Nontraumatic intracranial hemorrhage. Acute respiratory failure.  EXAM: PORTABLE CHEST - 1 VIEW  COMPARISON:  10/03/2013  FINDINGS: Improved aeration noted in the right lung base. Persistent left retrocardiac atelectasis or consolidation demonstrated. Cardiomegaly is stable. Diffuse interstitial prominence again noted and mild interstitial edema cannot be excluded. No pneumothorax identified.  IMPRESSION: Decreased right basilar atelectasis. Otherwise no significant change.   Electronically Signed   By: Earle Gell M.D.   On: 10/04/2013 07:20   Dg Chest Portable 1 View  10/03/2013   CLINICAL DATA:  Intubation.  EXAM: PORTABLE CHEST - 1 VIEW  COMPARISON:  Chest 10/03/2013 at 5:32 a.m.  FINDINGS: Endotracheal tube is in place with tip in good position 3.9 cm above the carina. Left IJ catheter is again seen with the tip projecting over the mid superior vena cava. Right effusion basilar airspace disease persist. There is increased left basilar atelectasis. There is cardiomegaly. No pneumothorax.  IMPRESSION: ET tube in good position.  No change in right effusion and basilar airspace disease. Left basilar atelectasis is noted.   Electronically Signed   By: Inge Rise M.D.   On: 10/03/2013 20:22   Dg Abd Portable 1v  10/04/2013   CLINICAL DATA:  Status post gastric catheter placement  EXAM: PORTABLE ABDOMEN - 1 VIEW  COMPARISON:  10/04/2013 603 hrs  FINDINGS: A nasogastric catheter is again noted within the stomach. It is directed towards of pyloric channel. Chronic changes in the left base are again seen.  IMPRESSION: Gastric catheter within the distal stomach.  These results were called by telephone at the time of interpretation on 10/04/2013 at 10:35 AM to Margaretha Sheffield, the patient's nurse who verbally acknowledged these results.   Electronically Signed   By: Inez Catalina M.D.   On: 10/04/2013 10:35   Dg Abd Portable 1v  10/04/2013   CLINICAL DATA:  Orogastric tube placement.  EXAM: PORTABLE ABDOMEN - 1 VIEW  COMPARISON:  DG ABD PORTABLE 1V dated 10/02/2013  FINDINGS: Orogastric tube tip noted below the left hemidiaphragm in the upper portion  of the left upper quadrant. Basilar atelectasis. Cardiomegaly. No free air.  IMPRESSION: 1. An orogastric tube noted with its tip below the left hemidiaphragm.  2.  Basilar atelectasis.  3. Cardiomegaly .   Electronically Signed   By: Marcello Moores  Register   On: 10/04/2013 07:29   ASSESSMENT / PLAN:  PULMONARY A:  Acute respiratory failure, re intubated 2/18 suspect due to PE Possible PNA vs atelectasis Acute RLL PE Suspected OSA/OHS P:   Goal pH>7.30, SpO2>92 Continuous mechanical support VAP bundle Daily SBT Discussed case with Dr Leonie Man 2/20 > anticoagulation contraindicated at this time, but clearly PE will need to be addressed or he will wean poorly. Plan for now will be to wait and watch off anticoag, repeat head CT scan beginning of next week. If [place] looks to be organizing then will try starting heparin gtt at that time. Clearly there are risks/benefits on each side.   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  P:  Goal SBP <160  Continue Amiodarone, Lipitor, Coreg, Clonidine, Imdur  Empiric abx for possibility of endocarditis/ RA vegetation Options for addressing RA thrombus discussed with TCTS and IR by Dr Oliver Pila on 2/19 >> Unable to consider thrombectomy when he cannot be heparinized; not a candidate for directed t-PA by IR due to risks of worsening ICH.  Hold ASA Labetalol PRN  RENAL A:   CKD stage 3 Hypokalemia  P:   Trend BMP Replete electrolytes as indicated   GASTROINTESTINAL A:   Obesity Mild elevated LFTs, resolved GERD on PPI preadmission Nutrition P:   TF per Nutritionist Protonix  HEMATOLOGIC A:   Mild anemia  P:  Trend CBC   INFECTIOUS  Recent Labs Lab 10/04/13 0758 10/05/13 0500  PROCALCITON 0.28 0.19  A:   Possible pneumonia vs atx, Pct is equivocal  P:   Empiric Abx as above Check PCT  ENDOCRINE A:   DM  P:   SSI  NEUROLOGIC A: Left temp/parietal ICH w associated edema Acute encephalopathy P:   Appreciate stroke team assistance Goal RASS 0 to -1 Propofol gtt Fentanyl PRN  I have personally obtained history, examined patient, evaluated and interpreted laboratory and imaging results, reviewed medical records, formulated assessment / plan and placed orders.  CRITICAL CARE:  The patient is critically ill with multiple organ systems failure and requires high complexity decision making for assessment and support, frequent evaluation and titration of therapies, application of advanced monitoring technologies and extensive interpretation of multiple databases. Critical Care Time devoted to patient care services described in this note is 40 minutes.   [name], MD, PhD 10/05/2013, 9:48 AM [facility] [phone] or if no answer [phone]

## 2013-10-05 NOTE — Progress Notes (Signed)
Patient Name: [name] Date of Encounter: 10/05/2013  Active Problems:   CVA (cerebral infarction)   Intracerebral hemorrhage   Hypertension   ICH (intracerebral hemorrhage)   CVA (cerebrovascular accident due to intracerebral hemorrhage)   Acute respiratory failure   Acute pulmonary edema   Aspiration pneumonia   Cardiac mass   Length of Stay: 4  SUBJECTIVE  Intubated, sedated.  CURRENT MEDS . amiodarone  200 mg Oral Daily  . antiseptic oral rinse  15 mL Mouth Rinse QID  . atorvastatin  10 mg Oral q1800  . brimonidine  1 drop Both Eyes TID  . carvedilol  25 mg Per Tube BID WC  . chlorhexidine  15 mL Mouth Rinse BID  . cloNIDine  0.1 mg Per Tube BID  . dorzolamide-timolol  1 drop Both Eyes BID  . enoxaparin (LOVENOX) injection  40 mg Subcutaneous Q24H  . feeding supplement (PRO-STAT SUGAR FREE 64)  60 mL Per Tube QID  . feeding supplement (VITAL HIGH PROTEIN)  1,000 mL Per Tube Q24H  . insulin aspart  0-15 Units Subcutaneous 6 times per day  . isosorbide mononitrate  30 mg Oral Daily  . magnesium oxide  400 mg Oral Daily  . multivitamin  5 mL Oral Daily  . pantoprazole (PROTONIX) IV  40 mg Intravenous QHS  . piperacillin-tazobactam (ZOSYN)  IV  3.375 g Intravenous Q8H  . pneumococcal 23 valent vaccine  0.5 mL Intramuscular Tomorrow-1000  . polyethylene glycol  17 g Oral Daily  . polyvinyl alcohol  1 drop Both Eyes BID  . senna-docusate  1 tablet Oral BID  . vancomycin  1,250 mg Intravenous Q12H    OBJECTIVE   Intake/Output Summary (Last 24 hours) at 10/05/13 1020 Last data filed at 10/05/13 0900  Gross per 24 hour  Intake 2396.28 ml  Output   1140 ml  Net 1256.28 ml   Filed Weights   10/03/13 0317 10/04/13 1400 10/05/13 0500  Weight: 128.5 kg (283 lb 4.7 oz) 126.3 kg (278 lb 7.1 oz) 127.7 kg (281 lb 8.4 oz)    PHYSICAL EXAM Filed Vitals:   10/05/13 0741 10/05/13 0800 10/05/13 0807 10/05/13 0900  BP:  174/99 174/99 138/81  Pulse:  70 74 70   Temp: 97.6 F (36.4 C)     TempSrc: Axillary     Resp:  17 18 23   Height:      Weight:      SpO2:  100%  100%   General: Intubated, sedated Head: no evidence of trauma, PERRL, EOMI, no exophtalmos or lid lag, no myxedema, no xanthelasma; normal ears, nose and oropharynx Neck: normal jugular venous pulsations and no hepatojugular reflux; brisk carotid pulses without delay and no carotid bruits Chest: clear to auscultation, no signs of consolidation by percussion or palpation, normal fremitus, symmetrical and full respiratory excursions; no redness or swelling at the pacemaker site Cardiovascular: normal position and quality of the apical impulse, regular rhythm, normal first and second heart sounds, no rubs or gallops, no murmur Abdomen: no tenderness or distention, no masses by palpation, no abnormal pulsatility or arterial bruits, normal bowel sounds, no hepatosplenomegaly Extremities: no clubbing, cyanosis, 1+ edema; 2+ radial, ulnar and brachial pulses bilaterally; 2+ right femoral, posterior tibial and dorsalis pedis pulses; 2+ left femoral, posterior tibial and dorsalis pedis pulses; no subclavian or femoral bruits Neurological: cannot be performed  LABS  CBC  Recent Labs  10/04/13 0758 10/05/13 0500  WBC 13.3* 11.4*  HGB 10.9* 10.6*  HCT 33.6*  33.4*  MCV 80.8 80.9  PLT 143* [id number]*   Basic Metabolic Panel  Recent Labs  10/04/13 0758 10/05/13 0500  NA 140 142  K 3.3* 3.3*  CL 95* 104  CO2 22 24  GLUCOSE 122* 130*  BUN 16 19  CREATININE 1.38* 1.18  CALCIUM 8.4 8.6   Liver Function Tests  Recent Labs  10/04/13 0758  AST 18  ALT 30  ALKPHOS 110  BILITOT 0.7  PROT 6.9  ALBUMIN 2.4*    Recent Labs  10/05/13 0500  CHOL 134  HDL 37*  LDLCALC 67  TRIG 152*  149  CHOLHDL 3.6   Thyroid Function Tests No results found for this basename: TSH, T4TOTAL, FREET3, T3FREE, THYROIDAB,  in the last 72 hours  Radiology Studies Imaging results have been reviewed  and Ct Head Wo Contrast  10/05/2013   CLINICAL DATA:  Follow-up stroke.  EXAM: CT HEAD WITHOUT CONTRAST  TECHNIQUE: Contiguous axial images were obtained from the base of the skull through the vertex without intravenous contrast.  COMPARISON:  CT HEAD W/O CM dated 10/03/2013; CT PORT HEAD WO CM dated 10/02/2013  FINDINGS: Left temporoparietal 2.2 x 3.1 cm intraparenchymal hematoma was 2.7 x 3.8 cm. Minimal surrounding low-density vasogenic edema with mild local mass effect. No rebleed.  The ventricles and sulci are overall normal for patient's age. Mild disproportionate prominence of the cerebellar folia. Patchy supratentorial white matter hypodensities exclusive of the vasogenic edema suggest chronic small vessel ischemic disease, stable. No acute large vascular territory infarct.  No abnormal extra-axial fluid collections. Basal cisterns are patent. Dolicoectatic basilar artery suggest chronic hypertension with moderate calcific atherosclerosis of the intracranial vessels.  Chronic paranasal sinus mucosal thickening, similar. Mastoid air cells are well aerated. Soft tissue within the external auditory canal likely reflects cerumen. Suspected proptosis, recommend clinical correlation.  IMPRESSION: Contracting left temporoparietal intraparenchymal hematoma was 2.7 x 3.8 cm, now 2.2 x 3.1 cm without rebleed.   Electronically Signed   By: Elon Alas   On: 10/05/2013 04:31   Ct Angio Chest Pe W/cm &/or Wo Cm  10/04/2013   CLINICAL DATA:  Abnormal echocardiogram. Question pulmonary embolus.  EXAM: CT ANGIOGRAPHY CHEST WITH CONTRAST  TECHNIQUE: Multidetector CT imaging of the chest was performed using the standard protocol during bolus administration of intravenous contrast. Multiplanar CT image reconstructions and MIPs were obtained to evaluate the vascular anatomy.  CONTRAST:  171mL OMNIPAQUE IOHEXOL 350 MG/ML SOLN  COMPARISON:  One-view chest x-ray 10/04/2013.  FINDINGS: Pulmonary retro opacification is  suboptimal. There is a significant filling defect within the right interlobar artery suggesting pulmonary embolus. There is some filling defect suspected in the right upper lobe as well on the of this area is degraded by patient breathing motion.  Scattered areas of airspace consolidation are noted. There is focal airspace disease in the posterior and lateral left upper lobe. Dependent airspace disease is present bilaterally. Small bilateral pleural effusions are noted.  The heart is enlarged. Atherosclerotic calcifications are present in the coronary artery in the descending aorta.  The bone windows with demonstrate no focal lytic or blastic lesions. An NG tube is in place. The patient is intubated.  Limited imaging in the upper abdomen is unremarkable.  Review of the MIP images confirms the above findings.  IMPRESSION: 1. Right lower lobar pulmonary embolus. 2. Left upper lobe on bilateral lower lobe airspace disease. While this likely represents atelectasis, early infection is not excluded. 3. Mild cardiomegaly and atherosclerotic disease. 4. Support apparatus  as described. Critical Value/emergent results were called by telephone at the time of interpretation on 10/04/2013 at 1:06 PM to Dr. Doree Fudge , who verbally acknowledged these results.   Electronically Signed   By: Lawrence Santiago M.D.   On: 10/04/2013 13:09   Dg Chest Port 1 View  10/05/2013   CLINICAL DATA:  Intubation.  EXAM: PORTABLE CHEST - 1 VIEW  COMPARISON:  Chest radiograph October 04, 2013  FINDINGS: Endotracheal tube tip projects 3.3 cm above the equina. Left internal jugular central venous catheter with distal tip projecting in proximal superior vena cava. Nasogastric tube past the gastroesophageal junction though, distal tip is not imaged. Dual lead right cardiac pacemaker in situ.  Stable at least moderate cardiomegaly, and mediastinal silhouette is nonsuspicious, patient is rotated to the right. Right lung base volume loss,  the cardiac silhouette is mildly shift of the right concerning for right middle lobe collapse. Mild patchy bibasilar airspace opacities with improved aeration of left lung base. Small pleural effusions. No pneumothorax.  Large body habitus. Osseous structures are nonsuspicious. No apparent change in position of life support lines.  IMPRESSION: Stable cardiomegaly, bibasilar airspace opacities with improved aeration of left lung base. Probable right middle lobe collapse. Small residual pleural effusions.   Electronically Signed   By: Elon Alas   On: 10/05/2013 06:15   Portable Chest Xray In Am  10/04/2013   CLINICAL DATA:  Cerebral infarction. Nontraumatic intracranial hemorrhage. Acute respiratory failure.  EXAM: PORTABLE CHEST - 1 VIEW  COMPARISON:  10/03/2013  FINDINGS: Improved aeration noted in the right lung base. Persistent left retrocardiac atelectasis or consolidation demonstrated. Cardiomegaly is stable. Diffuse interstitial prominence again noted and mild interstitial edema cannot be excluded. No pneumothorax identified.  IMPRESSION: Decreased right basilar atelectasis. Otherwise no significant change.   Electronically Signed   By: Earle Gell M.D.   On: 10/04/2013 07:20   Dg Chest Portable 1 View  10/03/2013   CLINICAL DATA:  Intubation.  EXAM: PORTABLE CHEST - 1 VIEW  COMPARISON:  Chest 10/03/2013 at 5:32 a.m.  FINDINGS: Endotracheal tube is in place with tip in good position 3.9 cm above the carina. Left IJ catheter is again seen with the tip projecting over the mid superior vena cava. Right effusion basilar airspace disease persist. There is increased left basilar atelectasis. There is cardiomegaly. No pneumothorax.  IMPRESSION: ET tube in good position.  No change in right effusion and basilar airspace disease. Left basilar atelectasis is noted.   Electronically Signed   By: Inge Rise M.D.   On: 10/03/2013 20:22   Dg Abd Portable 1v  10/04/2013   CLINICAL DATA:  Status post  gastric catheter placement  EXAM: PORTABLE ABDOMEN - 1 VIEW  COMPARISON:  10/04/2013 603 hrs  FINDINGS: A nasogastric catheter is again noted within the stomach. It is directed towards of pyloric channel. Chronic changes in the left base are again seen.  IMPRESSION: Gastric catheter within the distal stomach.  These results were called by telephone at the time of interpretation on 10/04/2013 at 10:35 AM to Margaretha Sheffield, the patient's nurse who verbally acknowledged these results.   Electronically Signed   By: Inez Catalina M.D.   On: 10/04/2013 10:35   Dg Abd Portable 1v  10/04/2013   CLINICAL DATA:  Orogastric tube placement.  EXAM: PORTABLE ABDOMEN - 1 VIEW  COMPARISON:  DG ABD PORTABLE 1V dated 10/02/2013  FINDINGS: Orogastric tube tip noted below the left hemidiaphragm in the upper portion of the  left upper quadrant. Basilar atelectasis. Cardiomegaly. No free air.  IMPRESSION: 1. An orogastric tube noted with its tip below the left hemidiaphragm.  2.  Basilar atelectasis.  3. Cardiomegaly .   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  Cultures negative to date. I there a non-MRI based imaging study that could reliably exclude a mycotic aneurysm as a cause of his ICH? (CT angio satisfactory?). No overt evidence of pacemaker pocket infection. Afebrile. If the mass is indeed a thrombus, the risk is of pulmonary embolism. Again, the safety of starting anticoagulation would depend on identifying the etiology of the ICH. Lead extraction would only be helpful/warranted if he does have a vegetation/infection. Recommend TEE - better imaging of the RA mass itself would not help, but identifying vegetations in other locations (on the mitral or aortic valve) may be beneficial.   Sanda Klein, MD, [facility]  [facility] [phone] office [phone] pager 10/05/2013 10:20 AM

## 2013-10-05 NOTE — Progress Notes (Signed)
Speech Language Pathology Discharge Patient Details Name: [name] MRN: [id number] DOB: 29-Dec-1945 Today's Date: 10/05/2013 Time:  -     Patient discharged from SLP services secondary to medical decline - will need to re-order SLP to resume therapy services.  Continues to be intubated.  Please see latest therapy progress note for current level of functioning and progress toward goals.    Progress and discharge plan discussed with patient and/or caregiver: not discussed with pt. (sedated)  GO     10/05/2013, 9:39 AM

## 2013-10-05 NOTE — Progress Notes (Signed)
PT Cancellation Note  Patient Details Name: [name] MRN: [id number] DOB: 06/15/1946   Cancelled Treatment:    Reason Eval/Treat Not Completed: Patient not medically ready. Pt continues to be intubated and sedated. Will re-attempt to see pt when he is medically ready and appropriate.    Elie Confer [place], [place] 10/05/2013, 8:39 AM

## 2013-10-05 NOTE — H&P (View-Only) (Signed)
CARDIOLOGY CONSULT NOTE  Patient ID: [name] MRN: [id number] DOB/AGE: 66-Feb-1947 66 y.o.  Admit date: 10/01/2013 Referring Physician: Dr Leonie Man  Primary Cardiologist: Dr Sallyanne Kuster  Reason for Consultation: right atrial mass  HPI: 68 year-old gentleman with morbid obesity, diabetes mellitus, hypertension, obstructive sleep apnea on CPAP, mild valvular aortic stenosis, mild coronary atherosclerosis treated medically, legal blindness, severe diabetic peripheral neuropathy complicated by a poorly healing wounds, chronic venous insufficiency and a dual-chamber permanent pacemaker that was implanted in 2005 for sinus node dysfunction.  He was admitted 2/16 with mental status changes and expressive aphasia and was diagnosed with left temporal/parietal parenchymal hematoma. He developed progressively decreased level of consciousness requiring intubation.  He had an echo this morning demonstrating a right atrial mobile mass consistent raising suspicion of thrombus. We are asked to see him for further evaluation. Note the patient has a dual chamber pacemaker. He has had PAF but has not been anticoagulated because of hx rectal bleeding and concerns about compliance. At his most recent cardiac eval he was maintaining sinus rhythm based on pacer interrogation.  There is no hx obtainable other than from record review. The patient is intubated and sedated.  Past Medical History  Diagnosis Date  . Diabetes type 2, uncontrolled   . Gout   . Hyperlipidemia   . Hypertension   . Obstructive sleep apnea     On CPAP. Last sleep study (06/2009) - Severe obstructive sleep apnea/hypopnea syndrome, apnea-hypopnea index 70.5 per hour with non positional events moderately loud snoring and oxygen desaturation to a nadir of 85% on room air.  . Benign prostatic hypertrophy   . Morbid obesity   . Cardiomyopathy, hypertensive      diastolic dysfunction by [id number] echo  . Coronary artery disease    nonobstructive. 2 vessel. cardiac catheter in March 2005 showing 60% LAD occlusion, 30% circumflex occlusion.  . 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 Legally blind   . Herpes   . Occult blood positive stool      History of in August 2007.  colonoscopy in January 2008 showing normal colon with fair inadequate prep. By Dr. Silvano Rusk.  . Incarcerated ventral hernia     H/O. S/P repair ventral hernia repair 07/2005.  . Diabetic ulcer of thigh     H/O Rt thigh ulcer.  . Diabetic peripheral neuropathy   . Exertional shortness of breath     "comes and goes" (06/23/2013)  . GERD (gastroesophageal reflux disease)   . Degenerative joint disease      Right knee.  . Arthritis     "all over" (06/23/2013)  . Sepsis 08/26/2013  . Atrial fibrillation     not Candidate for Coumadin d/t rectal bleeding  . Hypotension 09/05/2013     Past Surgical History  Procedure Laterality Date  . Pacemaker lead revision  12/2003     likely microdislodgment  of right ventricular lead  . Ventral hernia repair  07/2005     S/P laparotomy, lysis of adhesions, repair of incarcerated ventral hernia with  partial omentectomy.. Performed by Dr. Marlou Starks.  . Hernia repair    . Cataract extraction, bilateral Bilateral   . Refractive surgery Bilateral   . Eye surgery    . 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 Amputation Left 06/22/2013    Procedure: AMPUTATION FOOT;  Surgeon: Newt Minion, MD;  Location: [facility];  Service: Orthopedics;  Laterality: Left;  Left Midfoot Amputation  . Insert / replace / remove pacemaker  December 02, 2003    Secondary to symptomatic bradycardia. DDDR pacemaker. By Dr. Rollene Fare.  . Insert / replace / remove pacemaker  12-01-12    "old pacemaker died; had new one put in" (17-Jul-2013)     Family History  Problem Relation Age of Onset  . Coronary artery disease Sister   . Heart disease Sister   . Allergies Sister   . Asthma Brother   . Rheum  arthritis Brother     Social History: History   Social History  . Marital Status: Single    Spouse Name: N/A    Number of Children: Y  . Years of Education: N/A   Occupational History  . disabled    Social History Main Topics  . Smoking status: Former Smoker -- 0.00 packs/day for 0 years    Types: Cigarettes    Quit date: 08/17/2003  . Smokeless tobacco: Never Used     Comment: started at age 30.  only smoked on occ- very rarely.0  . Alcohol Use: Yes     Comment: 2013-07-17 "drank some; never had a problem w/it"  . Drug Use: No  . Sexual Activity: No   Other Topics Concern  . Not on file   Social History Narrative  . No narrative on file     Prior to Admission medications   Medication Sig Start Date End Date Taking? Authorizing Provider  albuterol (PROVENTIL HFA;VENTOLIN HFA) 108 (90 BASE) MCG/ACT inhaler Inhale 2 puffs into the lungs every 6 (six) hours as needed for wheezing.   Yes Historical Provider, MD  amiodarone (PACERONE) 200 MG tablet Take 200 mg by mouth daily.   Yes Historical Provider, MD  aspirin 81 MG chewable tablet Chew 81 mg by mouth daily.   Yes Historical Provider, MD  brimonidine (ALPHAGAN P) 0.1 % SOLN Place 1 drop into both eyes every 8 (eight) hours.   Yes Historical Provider, MD  carvedilol (COREG) 25 MG tablet Take 25 mg by mouth 2 (two) times daily with a meal.    Yes Historical Provider, MD  cloNIDine (CATAPRES) 0.2 MG tablet Take 0.5 tablets (0.1 mg total) by mouth 2 (two) times daily. 09/07/13  Yes Juluis Mire, MD  colchicine 0.6 MG tablet Take 0.6 mg by mouth 2 (two) times daily as needed (for gout flare-ups).    Yes Historical Provider, MD  dorzolamide-timolol (COSOPT) 22.3-6.8 MG/ML ophthalmic solution Place 1 drop into both eyes 2 (two) times daily.   Yes Historical Provider, MD  HYDROcodone-acetaminophen (NORCO/VICODIN) 5-325 MG per tablet Take 1-2 tablets by mouth every 6 (six) hours as needed for moderate pain.   Yes Historical Provider,  MD  isosorbide mononitrate (IMDUR) 30 MG 24 hr tablet Take 30 mg by mouth daily.   Yes Historical Provider, MD  magnesium oxide (MAG-OX) 400 MG tablet Take 400 mg by mouth daily.   Yes Historical Provider, MD  methylcellulose (ARTIFICIAL TEARS) 1 % ophthalmic solution Place 1 drop into both eyes 2 (two) times daily.   Yes Historical Provider, MD  omeprazole (PRILOSEC OTC) 20 MG tablet Take 20 mg by mouth every evening.    Yes Historical Provider, MD  polyethylene glycol (MIRALAX / GLYCOLAX) packet Take 17 g by mouth daily.    Yes Historical Provider, MD  rosuvastatin (CRESTOR) 40 MG tablet Take 40 mg by mouth every evening.   Yes Historical Provider, MD  sennosides-docusate sodium (SENOKOT-S) 8.6-50 MG tablet Take 2 tablets by  mouth daily.   Yes Historical Provider, MD  insulin glargine (LANTUS) 100 UNIT/ML injection Inject 0.08 mLs (8 Units total) into the skin at bedtime. 09/07/13   Jerene Pitch, MD    HOSPITAL MEDICATIONS: . amiodarone  200 mg Oral Daily  . antiseptic oral rinse  15 mL Mouth Rinse QID  . atorvastatin  10 mg Oral q1800  . brimonidine  1 drop Both Eyes TID  . carvedilol  25 mg Per Tube BID WC  . chlorhexidine  15 mL Mouth Rinse BID  . cloNIDine  0.1 mg Per Tube BID  . dorzolamide-timolol  1 drop Both Eyes BID  . enoxaparin (LOVENOX) injection  40 mg Subcutaneous Q24H  . feeding supplement (PRO-STAT SUGAR FREE 64)  60 mL Per Tube QID  . insulin aspart  0-15 Units Subcutaneous 6 times per day  . isosorbide mononitrate  30 mg Oral Daily  . magnesium oxide  400 mg Oral Daily  . multivitamin  5 mL Oral Daily  . pantoprazole (PROTONIX) IV  40 mg Intravenous QHS  . piperacillin-tazobactam (ZOSYN)  IV  3.375 g Intravenous Q8H  . pneumococcal 23 valent vaccine  0.5 mL Intramuscular Tomorrow-1000  . polyethylene glycol  17 g Oral Daily  . polyvinyl alcohol  1 drop Both Eyes BID  . senna-docusate  1 tablet Oral BID  . vancomycin  1,250 mg Intravenous Q12H   . sodium chloride  20 mL/hr at 10/03/13 0700  . propofol 40 mcg/kg/min (10/04/13 0700)    ROS: Not able to obtain.  Physical Exam: Blood pressure 108/67, pulse 71, temperature 99.2 F (37.3 C), temperature source Axillary, resp. rate 16, height 6' (1.829 m), weight 283 lb 4.7 oz (128.5 kg), SpO2 100.00%.  Pt is morbidly obese, intubated, sedated HEENT: normal except for ET tube in place Neck: JVP - unable to visualize. Carotid upstrokes normal without bruits. No thyromegaly. Lungs: equal expansion, coarse bilaterally CV: Apex is nonpalpable, RRR without murmur or gallop Abd: soft, NT, +BS, no bruit, obese Back: no CVA tenderness Ext: diffuse 1+ edema        DP/PT pulses intact and = Skin: warm and dry without rash Neuro: sedated.   Labs:   Lab Results  Component Value Date   WBC 13.3* 10/04/2013   HGB 10.9* 10/04/2013   HCT 33.6* 10/04/2013   MCV 80.8 10/04/2013   PLT 143* 10/04/2013    Recent Labs Lab 10/04/13 0758  NA 140  K 3.3*  CL 95*  CO2 22  BUN 16  CREATININE 1.38*  CALCIUM 8.4  PROT 6.9  BILITOT 0.7  ALKPHOS 110  ALT 30  AST 18  GLUCOSE 122*   Lab Results  Component Value Date   CKTOTAL 160 08/21/2008   CKMB 2.4 08/21/2008   TROPONINI <0.30 10/01/2013    Lab Results  Component Value Date   CHOL 171 06/29/2010   CHOL 214* 05/15/2009   CHOL 187 11/07/2008   Lab Results  Component Value Date   HDL 32 06/29/2010   HDL 35* 05/15/2009   HDL 36* 11/07/2008   Lab Results  Component Value Date   LDLCALC 118 06/29/2010   LDLCALC 135* 05/15/2009   LDLCALC 128* 11/07/2008   Lab Results  Component Value Date   TRIG 144 10/04/2013   TRIG 107 06/29/2010   TRIG 222* 05/15/2009   Lab Results  Component Value Date   CHOLHDL 6.1 Ratio 05/15/2009   CHOLHDL 5.2 Ratio 11/07/2008   CHOLHDL 4.6 Ratio 09/24/2008  No results found for this basename: LDLDIRECT      Radiology: Ct Head Wo Contrast  10/03/2013   CLINICAL DATA:  Follow-up evaluation.  EXAM: CT HEAD WITHOUT CONTRAST   TECHNIQUE: Contiguous axial images were obtained from the base of the skull through the vertex without intravenous contrast.  COMPARISON:  CT PORT HEAD WO CM dated 10/02/2013; CT HEAD W/O CM dated 10/01/2013  FINDINGS: Left temporal parietal 2.7 x 3 point cm a intraparenchymal hematoma was 2.8 x 4 cm. Similar surrounding low-density vasogenic edema with mild mass effect.  The ventricles and sulci are overall normal for patient's age. Mild prominence of the cerebellar folia, somewhat advanced for age. Patchy supratentorial white matter hypodensities exclusive of the vasogenic edema suggests chronic small vessel ischemic disease.  No abnormal extra-axial fluid collections. Basal cisterns are patent. Dolichoectasia of the basilar artery may reflect chronic hypertension. Moderate calcific atherosclerosis of the intracranial vessels.  Chronic paranasal sinus mucosal thickening without air-fluid levels. Mastoid air cells appear well-aerated. Soft tissue within the external auditory canals likely reflects cerumen. Suspected proptosis, recommend clinical correlation.  IMPRESSION: Contracting left temporal parietal intraparenchymal hematoma, no rebleed.  Moderate white matter changes suggest chronic small vessel ischemic disease.   Electronically Signed   By: Elon Alas   On: 10/03/2013 05:18   Portable Chest Xray In Am  10/04/2013   CLINICAL DATA:  Cerebral infarction. Nontraumatic intracranial hemorrhage. Acute respiratory failure.  EXAM: PORTABLE CHEST - 1 VIEW  COMPARISON:  10/03/2013  FINDINGS: Improved aeration noted in the right lung base. Persistent left retrocardiac atelectasis or consolidation demonstrated. Cardiomegaly is stable. Diffuse interstitial prominence again noted and mild interstitial edema cannot be excluded. No pneumothorax identified.  IMPRESSION: Decreased right basilar atelectasis. Otherwise no significant change.   Electronically Signed   By: Earle Gell M.D.   On: 10/04/2013 07:20   Dg  Chest Portable 1 View  10/03/2013   CLINICAL DATA:  Intubation.  EXAM: PORTABLE CHEST - 1 VIEW  COMPARISON:  Chest 10/03/2013 at 5:32 a.m.  FINDINGS: Endotracheal tube is in place with tip in good position 3.9 cm above the carina. Left IJ catheter is again seen with the tip projecting over the mid superior vena cava. Right effusion basilar airspace disease persist. There is increased left basilar atelectasis. There is cardiomegaly. No pneumothorax.  IMPRESSION: ET tube in good position.  No change in right effusion and basilar airspace disease. Left basilar atelectasis is noted.   Electronically Signed   By: Inge Rise M.D.   On: 10/03/2013 20:22   Portable Chest Xray In Am  10/03/2013   CLINICAL DATA:  Hypoxia  EXAM: PORTABLE CHEST - 1 VIEW  COMPARISON:  October 02, 2013  FINDINGS: Endotracheal tube tip is 3.9 cm above the carina. Nasogastric tube tip and side port are in the stomach. Left jugular catheter tip is in the superior vena cava. No pneumothorax.  There is a right pleural effusion with right base atelectasis. Lungs are otherwise clear. Heart is mildly enlarged with normal pulmonary vascularity. Pacemaker leads are attached to the right atrium and right ventricle.  IMPRESSION: Fairly small right pleural effusion with right base atelectasis. Lungs are otherwise clear. Tube and catheter positions as described without pneumothorax.   Electronically Signed   By: Lowella Grip M.D.   On: 10/03/2013 07:05   Dg Abd Portable 1v  10/04/2013   CLINICAL DATA:  Status post gastric catheter placement  EXAM: PORTABLE ABDOMEN - 1 VIEW  COMPARISON:  10/04/2013 603  hrs  FINDINGS: A nasogastric catheter is again noted within the stomach. It is directed towards of pyloric channel. Chronic changes in the left base are again seen.  IMPRESSION: Gastric catheter within the distal stomach.  These results were called by telephone at the time of interpretation on 10/04/2013 at 10:35 AM to Margaretha Sheffield, the patient's  nurse who verbally acknowledged these results.   Electronically Signed   By: Inez Catalina M.D.   On: 10/04/2013 10:35   Dg Abd Portable 1v  10/04/2013   CLINICAL DATA:  Orogastric tube placement.  EXAM: PORTABLE ABDOMEN - 1 VIEW  COMPARISON:  DG ABD PORTABLE 1V dated 10/02/2013  FINDINGS: Orogastric tube tip noted below the left hemidiaphragm in the upper portion of the left upper quadrant. Basilar atelectasis. Cardiomegaly. No free air.  IMPRESSION: 1. An orogastric tube noted with its tip below the left hemidiaphragm.  2.  Basilar atelectasis.  3. Cardiomegaly .   Electronically Signed   By: Marcello Moores  Register   On: 10/04/2013 07:29    EKG: A-paced, age-indeterminate anteroseptal infarct  Echo: Study Conclusions  - Left ventricle: The cavity size was normal. Wall thickness was increased in a pattern of mild LVH. The estimated ejection fraction was 60%. Wall motion was normal; there were no regional wall motion abnormalities. - Aortic valve: Moderately thickened leaflets. There was mild stenosis. Mean gradient: 91mm Hg (S). Peak gradient: 48mm Hg (S). - Left atrium: The atrium was mildly dilated. - Right ventricle: The cavity size was mildly dilated. Pacer wire or catheter noted in right ventricle. Systolic function was mildly reduced. There was a mass. - Right atrium: There was a massin the atrial cavity. - Tricuspid valve: There was a mass. - Impressions: There is a mass present in the right atrium and right ventricle. It is not seen in the IVC. It was not present on the study of May, 2014. Considering all factors, the most likely etiology is clot. The mass is seen in the RA. There is also mass that moves across the tricuspid valve. There is probably also mass in the RV cavity. I can not be sure, but all of these targets might be connected. Impressions:  - There is a mass present in the right atrium and right ventricle. It is not seen in the IVC. It was not present on the study of  May, 2014. Considering all factors, the most likely etiology is clot. The mass is seen in the RA. There is also mass that moves across the tricuspid valve. There is probably also mass in the RV cavity. I can not be sure, but all of these targets might be connected.  ASSESSMENT AND PLAN:  1. Right atrial mass. Mobile mass crosses the tricuspid valve. I have personally reviewed the patient's echo images with Dr Sallyanne Kuster. It appears the mass is attached to his pacer wire. Diff dx includes thrombus or vegetation. Think thrombus is most likely. No clear signs of infectious endocarditis, but could be a link between cardiac thrombus or vegetation and CNS hematoma. Blood cx's x 2 drawn 2/18 and currently pending.   Treatment options are extremely limited. He is not a candidate for anticoagulation with acute intracerebral hematoma. Will check with EP colleagues but I don't think lead can/should be extracted if this is acute thrombus. If cx's positive will need to consider lead extraction/IV Abx. Also, agree with CTA chest to eval for pulmonary emboli at which patient is at extremely high-risk.   2. PAF - currently a-paced. Recent  pacer interrogation confirmed absence of AF. No anticoagulation as above.    [name] 10/04/2013, 11:50 AM

## 2013-10-05 NOTE — Progress Notes (Signed)
Pt passed SBT and is tolerating well on CP/PS of 5/5 and 40% at this time. No complications noted. RT will monitor.

## 2013-10-05 NOTE — Progress Notes (Signed)
Unable to get consent for TEE today.  The nurse has been trying to contact the daughter who has POA.  Will continue to try to contact her daughter to get consent and will plan on TEE on Monday.

## 2013-10-05 NOTE — Interval H&P Note (Signed)
History and Physical Interval Note:  10/05/2013 4:36 PM  [name]  has presented today for surgery, with the diagnosis of * No surgery found *  The various methods of treatment have been discussed with the patient and family. After consideration of risks, benefits and other options for treatment, the patient has consented to  * No surgery found * as a surgical intervention .  The patient's history has been reviewed, patient examined, no change in status, stable for surgery.  I have reviewed the patient's chart and labs.  Questions were answered to the patient's satisfaction.     TURNER,TRACI R

## 2013-10-05 NOTE — Progress Notes (Signed)
OT Cancellation Note  Patient Details Name: [name] MRN: [id number] DOB: 1946-01-24   Cancelled Treatment:    Reason Eval/Treat Not Completed: Patient not medically ready. Pt continues to be intubated and sedated. Will re-attempt to see pt when he is medically ready and appropriate.  10/05/2013 Darrol Jump OTR/L Pager [phone] Office [phone]

## 2013-10-05 NOTE — Progress Notes (Signed)
Patient transported to CT and back with no complications. RT will continue to monitor.

## 2013-10-05 NOTE — Progress Notes (Signed)
Stroke Team Progress Note  HISTORY [name] is an 68 y.o. male who lives with a roommate. Roommate felt that patient was not at his baseline today and this morning felt hat he was hallucinating. He went to his day program where they noticed that he was not talking right. EMS was called at that time. Last known well 09/30/2013 at 22:30. Patient was not administerd TPA secondary to delay in arrival. He was admitted to the neuro ICU for further evaluation and treatment.  SUBJECTIVE His daughter is at the bedside.  OBJECTIVE Most recent Vital Signs: Filed Vitals:   10/05/13 0700 10/05/13 0741 10/05/13 0800 10/05/13 0807  BP: 133/90  174/99 174/99  Pulse: 70  70 74  Temp:  97.6 F (36.4 C)    TempSrc:  Axillary    Resp: 16  17 18   Height:      Weight:      SpO2: 100%  100%    CBG (last 3)   Recent Labs  10/04/13 2051 10/05/13 0021 10/05/13 0349  GLUCAP 141* 147* 142*    IV Fluid Intake:   . sodium chloride 20 mL/hr at 10/05/13 0800  . propofol 25 mcg/kg/min (10/05/13 [id number])    MEDICATIONS  . amiodarone  200 mg Oral Daily  . antiseptic oral rinse  15 mL Mouth Rinse QID  . atorvastatin  10 mg Oral q1800  . brimonidine  1 drop Both Eyes TID  . carvedilol  25 mg Per Tube BID WC  . chlorhexidine  15 mL Mouth Rinse BID  . cloNIDine  0.1 mg Per Tube BID  . dorzolamide-timolol  1 drop Both Eyes BID  . enoxaparin (LOVENOX) injection  40 mg Subcutaneous Q24H  . feeding supplement (PRO-STAT SUGAR FREE 64)  60 mL Per Tube QID  . feeding supplement (VITAL HIGH PROTEIN)  1,000 mL Per Tube Q24H  . insulin aspart  0-15 Units Subcutaneous 6 times per day  . isosorbide mononitrate  30 mg Oral Daily  . magnesium oxide  400 mg Oral Daily  . multivitamin  5 mL Oral Daily  . pantoprazole (PROTONIX) IV  40 mg Intravenous QHS  . piperacillin-tazobactam (ZOSYN)  IV  3.375 g Intravenous Q8H  . pneumococcal 23 valent vaccine  0.5 mL Intramuscular Tomorrow-1000  . polyethylene glycol  17  g Oral Daily  . polyvinyl alcohol  1 drop Both Eyes BID  . potassium chloride  20 mEq Per Tube Q4H  . senna-docusate  1 tablet Oral BID  . vancomycin  1,250 mg Intravenous Q12H   PRN:  acetaminophen, acetaminophen, fentaNYL, labetalol  Diet:  NPO  Activity:  As tolerated DVT Prophylaxis:  SCDs   CLINICALLY SIGNIFICANT STUDIES Basic Metabolic Panel:   Recent Labs Lab 10/04/13 0758 10/05/13 0500  NA 140 142  K 3.3* 3.3*  CL 95* 104  CO2 22 24  GLUCOSE 122* 130*  BUN 16 19  CREATININE 1.38* 1.18  CALCIUM 8.4 8.6   Liver Function Tests:   Recent Labs Lab 10/01/13 1504 10/04/13 0758  AST 51* 18  ALT 77* 30  ALKPHOS 143* 110  BILITOT 0.2* 0.7  PROT 7.3 6.9  ALBUMIN 2.6* 2.4*   CBC:   Recent Labs Lab 10/01/13 1504  10/04/13 0758 10/05/13 0500  WBC 8.0  < > 13.3* 11.4*  NEUTROABS 4.8  --   --   --   HGB 11.2*  < > 10.9* 10.6*  HCT 34.8*  < > 33.6* 33.4*  MCV 80.9  < >  80.8 80.9  PLT 220  < > 143* 143*  < > = values in this interval not displayed. Coagulation:   Recent Labs Lab 10/01/13 1504  LABPROT 14.0  INR 1.10   Cardiac Enzymes:   Recent Labs Lab 10/01/13 1504  TROPONINI <0.30   Urinalysis:   Recent Labs Lab 10/01/13 1915  COLORURINE YELLOW  LABSPEC 1.013  PHURINE 7.5  GLUCOSEU NEGATIVE  HGBUR NEGATIVE  BILIRUBINUR NEGATIVE  KETONESUR NEGATIVE  PROTEINUR NEGATIVE  UROBILINOGEN 0.2  NITRITE NEGATIVE  LEUKOCYTESUR NEGATIVE   Lipid Panel    Component Value Date/Time   CHOL 134 10/05/2013 0500   TRIG 152* 10/05/2013 0500   TRIG 149 10/05/2013 0500   HDL 37* 10/05/2013 0500   CHOLHDL 3.6 10/05/2013 0500   VLDL 30 10/05/2013 0500   LDLCALC 67 10/05/2013 0500   HgbA1C  Lab Results  Component Value Date   HGBA1C 8.2* 10/02/2013    Urine Drug Screen:     Component Value Date/Time   LABOPIA NONE DETECTED 10/01/2013 1915   COCAINSCRNUR NONE DETECTED 10/01/2013 1915   COCAINSCRNUR NEG 12/16/2008 2246   LABBENZ NONE DETECTED 10/01/2013  1915   LABBENZ NEG 12/16/2008 2246   AMPHETMU NONE DETECTED 10/01/2013 1915   AMPHETMU NEG 12/16/2008 2246   THCU NONE DETECTED 10/01/2013 1915   LABBARB NONE DETECTED 10/01/2013 1915    Alcohol Level:   Recent Labs Lab 10/01/13 1504  ETH <11   CT of the brain   10/05/2013 Contracting left temporoparietal intraparenchymal hematoma was 2.7 x 3.8 cm, now 2.2 x 3.1 cm without rebleed. 10/03/2013 Contracting left temporal parietal intraparenchymal hematoma, no rebleed. Moderate white matter changes suggest chronic small vessel ischemic disease. 10/02/2013   Limited portable CT: Skullbase and posterior fossa not been included.  Evolving left temporoparietal intraparenchymal hematoma with similar mass effect. No new hemorrhage.   10/01/2013    1. 4 x 3 cm left temporal/parietal parenchymal hematoma. 2. 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 3. Diffuse inflammatory paranasal sinus disease.     MRI/MRA head pacemaker  2D Echo There is a mass present in the right atrium and right ventricle. It is not seen in the IVC. It was not present on the study of May, 2014. Considering all factors, the most likely etiology is clot. The mass is seen in the RA. There is also mass that moves across the tricuspid valve. There is probably also mass in the RV cavity. I can not be sure, but all of these targets might be connected.  Carotid Doppler  CT angio chest 1. Right lower lobar pulmonary embolus.  2. Left upper lobe on bilateral lower lobe airspace disease. While  this likely represents atelectasis, early infection is not excluded.  3. Mild cardiomegaly and atherosclerotic disease.  4. Support apparatus as described.  CXR   10/05/2013 Stable cardiomegaly, bibasilar airspace opacities with improved  aeration of left lung base. Probable right middle lobe collapse. Small residual pleural effusions.  10/04/2013 Decreased right basilar atelectasis. Otherwise no significant  change. 10/03/2013 Fairly  small right pleural effusion with right base atelectasis. Lungs are otherwise clear. Tube and catheter positions as described without pneumothorax. 10/02/2013    Endotracheal tube 4 cm above the carina. Left central line tip projected laterally towards the lateral wall of the upper SVC. No pneumothorax.  Cardiomegaly with vascular congestion.  Small effusions and bibasilar atelectasis.    10/01/2013   The appearance of the pulmonary interstitium suggests mild interstitial edema likely secondary  to CHF. There is no evidence of pneumonia or pleural effusion.     EKG  Sinus or ectopic atrial rhythm. Prolonged PR interval. Anterior infarct, old. No significant change was found.    Therapy Recommendations   Physical Exam   Middle aged male intubated   . Afebrile. Head is nontraumatic. Neck is supple without bruit. Hearing is normal. Cardiac exam no murmur or gallop. Lungs are clear to auscultation. Distal pulses are well felt. Neurological Exam : Sleepy but can be aroused. Eyes are closed. Pupils 6 mm reactive. Spontaneous side-to-side eye movements are present. Fundi were not visualized. No facial weakness. Tongue midline. Able to move all 4 extremities well against gravity left side greater than right. No focal weakness. Does not follow commands. Deep tendon reflexes 1+ symmetric. Plantars downgoing.  ASSESSMENT Mr. Smiley Blosser is a 69 y.o. male presenting with altered speech. Imaging confirms a left temporal parietal IPH with significant surrounding edema - no hydrocephalus, no [name]. Suspect underlying mass or ischemic infarct given amt of edema. Unable to get MRI to confirm due to pacer. Hemmorrhage stable. Has developed respirator insufficiency due to PE from RA clot. Not an anticoagulation candidate for treatment due to Pollard.  On aspirin 81 mg orally every day prior to admission. Patient purposeful, localizes; global aphasia, right hemirparesis, dysphagia.  Right atrial clot, not a anticoagulation  candidate due to hemorrhage RLL pulmonary embolus, not a anticoagulation candidate due to hemorrhage Respiratory failure, intubated  due to respiratory insufficiency, low sats after admission. Extubated/reintubated 2/18 am due to pulmonary edema vs aspiration pneumonia. Started on vanc and zosyn. Respiratory problems now known to be due to PE from RA clot. atrial fibrillation, not a coumadin candidate secondary to hemorrhage. Not on prior to admission due to hx rectal bleeding. hypertension Hyperlipidemia, on crestor 40 mg daily PTA, now on no statin as NPO Diabetes, uncontrolled, HgbA1c 8.2, goal 7.0 obstructive sleep apnea Obesity, Body mass index is 38.17 kg/(m^2).   Pacemaker  Legally blind  Herpes  LDL [facility] day # 4  TREATMENT/PLAN  Continue ICU level care  CCM following for need for continued extubation. Will likely keep intubated over the weekend.  Cardiology following  Repeat CT in am. depending on resolution, may consider anticoagulation at that time.  Likely SNF at time of discharge  Long d/w daughter, Dr Lamonte Sakai and Dr Revonda Standard, MSN, RN, ANVP-BC, ANP-BC, GNP-BC [facility] Pager: [phone] 10/05/2013 8:28 AM  This patient is critically ill and at significant risk of neurological worsening, death and care requires constant monitoring of vital signs, hemodynamics,respiratory and cardiac monitoring,review of multiple databases, neurological assessment, discussion with family, other specialists and medical decision making of high complexity. I spent 50 minutes of neurocritical care time  in the care of  this patient.  I have personally obtained a history, examined the patient, evaluated imaging results, and formulated the assessment and plan of care. I agree with the above. Antony Contras, MD

## 2013-10-06 ENCOUNTER — Inpatient Hospital Stay (HOSPITAL_COMMUNITY): Payer: Medicare Other

## 2013-10-06 DIAGNOSIS — I2699 Other pulmonary embolism without acute cor pulmonale: Secondary | ICD-10-CM

## 2013-10-06 LAB — PROCALCITONIN: Procalcitonin: 0.12 ng/mL

## 2013-10-06 LAB — GLUCOSE, CAPILLARY
GLUCOSE-CAPILLARY: 130 mg/dL — AB (ref 70–99)
Glucose-Capillary: 154 mg/dL — ABNORMAL HIGH (ref 70–99)
Glucose-Capillary: 156 mg/dL — ABNORMAL HIGH (ref 70–99)
Glucose-Capillary: 174 mg/dL — ABNORMAL HIGH (ref 70–99)
Glucose-Capillary: 149 mg/dL — ABNORMAL HIGH (ref 70–99)

## 2013-10-06 LAB — BASIC METABOLIC PANEL
BUN: 25 mg/dL — AB (ref 6–23)
CO2: 25 meq/L (ref 19–32)
CREATININE: 1.14 mg/dL (ref 0.50–1.35)
Calcium: 8.8 mg/dL (ref 8.4–10.5)
Chloride: 105 mEq/L (ref 96–112)
GFR calc non Af Amer: 66 mL/min — ABNORMAL LOW (ref >90)
GFR, EST AFRICAN AMERICAN: 76 mL/min — AB (ref >90)
Glucose, Bld: 141 mg/dL — ABNORMAL HIGH (ref 70–99)
Potassium: 3.8 mEq/L (ref 3.7–5.3)
Sodium: 142 mEq/L (ref 137–147)

## 2013-10-06 LAB — CBC
HEMATOCRIT: 34.4 % — AB (ref 39.0–52.0)
HEMOGLOBIN: 10.9 g/dL — AB (ref 13.0–17.0)
MCH: 25.8 pg — AB (ref 26.0–34.0)
MCHC: 31.7 g/dL (ref 30.0–36.0)
MCV: 81.5 fL (ref 78.0–100.0)
Platelets: 168 10*3/uL (ref 150–400)
RBC: 4.22 MIL/uL (ref 4.22–5.81)
RDW: 15.8 % — AB (ref 11.5–15.5)
WBC: 8.8 10*3/uL (ref 4.0–10.5)

## 2013-10-06 LAB — CULTURE, RESPIRATORY: Special Requests: NORMAL

## 2013-10-06 LAB — CULTURE, RESPIRATORY W GRAM STAIN

## 2013-10-06 LAB — VANCOMYCIN, TROUGH: Vancomycin Tr: 23.3 ug/mL — ABNORMAL HIGH (ref 10.0–20.0)

## 2013-10-06 MED ORDER — VANCOMYCIN HCL IN DEXTROSE 1-5 GM/200ML-% IV SOLN
1000.0000 mg | Freq: Two times a day (BID) | INTRAVENOUS | Status: DC
  Administered 2013-10-06 – 2013-10-07 (×2): 1000 mg via INTRAVENOUS
  Filled 2013-10-06 (×3): qty 200

## 2013-10-06 MED ORDER — ISOSORBIDE DINITRATE 10 MG PO TABS
10.0000 mg | ORAL_TABLET | Freq: Two times a day (BID) | ORAL | Status: DC
Start: 2013-10-06 — End: 2013-10-16
  Administered 2013-10-06 – 2013-10-16 (×20): 10 mg via ORAL
  Filled 2013-10-06 (×22): qty 1

## 2013-10-06 MED ORDER — ENOXAPARIN SODIUM 40 MG/0.4ML ~~LOC~~ SOLN
40.0000 mg | SUBCUTANEOUS | Status: DC
  Administered 2013-10-06 – 2013-10-16 (×10): 40 mg via SUBCUTANEOUS
  Filled 2013-10-06 (×11): qty 0.4

## 2013-10-06 MED ORDER — VITAL HIGH PROTEIN PO LIQD
1000.0000 mL | ORAL | Status: DC
  Administered 2013-10-06: 1000 mL
  Administered 2013-10-06 – 2013-10-07 (×2)
  Administered 2013-10-07 – 2013-10-08 (×2): 1000 mL
  Administered 2013-10-08 (×3)
  Filled 2013-10-06 (×5): qty 1000

## 2013-10-06 NOTE — Progress Notes (Signed)
Pt passed SBT but is weaning on CP/PS of 5/10 due to no plans to extubate until Monday per CCM. No complications noted. RT will monitor.

## 2013-10-06 NOTE — Progress Notes (Signed)
Patient ID: [name], male   DOB: 03-24-1946, 68 y.o.   MRN: [id number]   Patient Name: [name] Date of Encounter: 10/06/2013     Active Problems:   CVA (cerebral infarction)   Intracerebral hemorrhage   Hypertension   ICH (intracerebral hemorrhage)   CVA (cerebrovascular accident due to intracerebral hemorrhage)   Acute respiratory failure   Acute pulmonary edema   Aspiration pneumonia   Cardiac mass    SUBJECTIVE He remains intubated. No response to verbal or physical stimulation.  CURRENT MEDS . amiodarone  200 mg Oral Daily  . antiseptic oral rinse  15 mL Mouth Rinse QID  . atorvastatin  10 mg Oral q1800  . brimonidine  1 drop Both Eyes TID  . carvedilol  25 mg Per Tube BID WC  . chlorhexidine  15 mL Mouth Rinse BID  . cloNIDine  0.1 mg Per Tube BID  . dorzolamide-timolol  1 drop Both Eyes BID  . enoxaparin (LOVENOX) injection  40 mg Subcutaneous Q24H  . feeding supplement (PRO-STAT SUGAR FREE 64)  60 mL Per Tube QID  . feeding supplement (VITAL HIGH PROTEIN)  1,000 mL Per Tube Q24H  . insulin aspart  0-15 Units Subcutaneous 6 times per day  . isosorbide dinitrate  10 mg Oral BID  . magnesium oxide  400 mg Oral Daily  . multivitamin  5 mL Oral Daily  . pantoprazole (PROTONIX) IV  40 mg Intravenous QHS  . piperacillin-tazobactam (ZOSYN)  IV  3.375 g Intravenous Q8H  . pneumococcal 23 valent vaccine  0.5 mL Intramuscular Tomorrow-1000  . polyethylene glycol  17 g Oral Daily  . polyvinyl alcohol  1 drop Both Eyes BID  . senna-docusate  1 tablet Oral BID  . vancomycin  1,000 mg Intravenous Q12H    OBJECTIVE  Filed Vitals:   10/06/13 0910 10/06/13 0911 10/06/13 1000 10/06/13 1135  BP:  153/84 124/73   Pulse:   70   Temp: 98.2 F (36.8 C)   97.8 F (36.6 C)  TempSrc: Axillary   Oral  Resp:   19   Height:      Weight:      SpO2:   100%     Intake/Output Summary (Last 24 hours) at 10/06/13 1138 Last data filed at 10/06/13 1100  Gross per 24  hour  Intake 1602.3 ml  Output   1315 ml  Net  287.3 ml   Filed Weights   10/04/13 1400 10/05/13 0500 10/06/13 0346  Weight: 278 lb 7.1 oz (126.3 kg) 281 lb 8.4 oz (127.7 kg) 284 lb 2.8 oz (128.9 kg)    PHYSICAL EXAM  Intubated and sedated with ETT in place Neuro: unable to assess HEENT:  Normal with ETT in place  Neck: Supple without bruits or JVD. Lungs:  Resp regular with scattered rhonchi Heart: RRR no s3, s4, or murmurs. Abdomen: Soft, non-tender, non-distended, BS + x 4. obese Extremities: No clubbing, cyanosis, edematous lower extremities.  Accessory Clinical Findings  CBC  Recent Labs  10/05/13 0500 10/06/13 0325  WBC 11.4* 8.8  HGB 10.6* 10.9*  HCT 33.4* 34.4*  MCV 80.9 81.5  PLT 143* [id number]   Basic Metabolic Panel  Recent Labs  10/05/13 0500 10/06/13 0325  NA 142 142  K 3.3* 3.8  CL 104 105  CO2 24 25  GLUCOSE 130* 141*  BUN 19 25*  CREATININE 1.18 1.14  CALCIUM 8.6 8.8   Liver Function Tests  Recent Labs  10/04/13 0758  AST  18  ALT 30  ALKPHOS 110  BILITOT 0.7  PROT 6.9  ALBUMIN 2.4*   No results found for this basename: LIPASE, AMYLASE,  in the last 72 hours Cardiac Enzymes No results found for this basename: CKTOTAL, CKMB, CKMBINDEX, TROPONINI,  in the last 72 hours BNP No components found with this basename: POCBNP,  D-Dimer No results found for this basename: DDIMER,  in the last 72 hours Hemoglobin A1C No results found for this basename: HGBA1C,  in the last 72 hours Fasting Lipid Panel  Recent Labs  10/05/13 0500  CHOL 134  HDL 37*  LDLCALC 67  TRIG 152*  149  CHOLHDL 3.6   Thyroid Function Tests No results found for this basename: TSH, T4TOTAL, FREET3, T3FREE, THYROIDAB,  in the last 72 hours  TELE NSR  Radiology/Studies  Ct Head Wo Contrast  10/05/2013   CLINICAL DATA:  Follow-up stroke.  EXAM: CT HEAD WITHOUT CONTRAST  TECHNIQUE: Contiguous axial images were obtained from the base of the skull through the  vertex without intravenous contrast.  COMPARISON:  CT HEAD W/O CM dated 10/03/2013; CT PORT HEAD WO CM dated 10/02/2013  FINDINGS: Left temporoparietal 2.2 x 3.1 cm intraparenchymal hematoma was 2.7 x 3.8 cm. Minimal surrounding low-density vasogenic edema with mild local mass effect. No rebleed.  The ventricles and sulci are overall normal for patient's age. Mild disproportionate prominence of the cerebellar folia. Patchy supratentorial white matter hypodensities exclusive of the vasogenic edema suggest chronic small vessel ischemic disease, stable. No acute large vascular territory infarct.  No abnormal extra-axial fluid collections. Basal cisterns are patent. Dolicoectatic basilar artery suggest chronic hypertension with moderate calcific atherosclerosis of the intracranial vessels.  Chronic paranasal sinus mucosal thickening, similar. Mastoid air cells are well aerated. Soft tissue within the external auditory canal likely reflects cerumen. Suspected proptosis, recommend clinical correlation.  IMPRESSION: Contracting left temporoparietal intraparenchymal hematoma was 2.7 x 3.8 cm, now 2.2 x 3.1 cm without rebleed.   Electronically Signed   By: Elon Alas   On: 10/05/2013 04:31   Ct Head Wo Contrast  10/03/2013   CLINICAL DATA:  Follow-up evaluation.  EXAM: CT HEAD WITHOUT CONTRAST  TECHNIQUE: Contiguous axial images were obtained from the base of the skull through the vertex without intravenous contrast.  COMPARISON:  CT PORT HEAD WO CM dated 10/02/2013; CT HEAD W/O CM dated 10/01/2013  FINDINGS: Left temporal parietal 2.7 x 3 point cm a intraparenchymal hematoma was 2.8 x 4 cm. Similar surrounding low-density vasogenic edema with mild mass effect.  The ventricles and sulci are overall normal for patient's age. Mild prominence of the cerebellar folia, somewhat advanced for age. Patchy supratentorial white matter hypodensities exclusive of the vasogenic edema suggests chronic small vessel ischemic disease.   No abnormal extra-axial fluid collections. Basal cisterns are patent. Dolichoectasia of the basilar artery may reflect chronic hypertension. Moderate calcific atherosclerosis of the intracranial vessels.  Chronic paranasal sinus mucosal thickening without air-fluid levels. Mastoid air cells appear well-aerated. Soft tissue within the external auditory canals likely reflects cerumen. Suspected proptosis, recommend clinical correlation.  IMPRESSION: Contracting left temporal parietal intraparenchymal hematoma, no rebleed.  Moderate white matter changes suggest chronic small vessel ischemic disease.   Electronically Signed   By: Elon Alas   On: 10/03/2013 05:18   Ct Head Wo Contrast  10/01/2013   CLINICAL DATA:  Aphasic  EXAM: CT HEAD WITHOUT CONTRAST  TECHNIQUE: Contiguous axial images were obtained from the base of the skull through the vertex without  intravenous contrast.  COMPARISON:  None.  FINDINGS: Skull and Sinuses:There is patchy mucosal thickening in the bilateral paranasal sinuses, worse in the ethmoid and frontal sinuses.  Orbits: Bilateral cataract resection.  Brain: 4 x 2.8 cm parenchymal hematoma in the left temporoparietal cortex and subcortical white matter. There is a tiny calcification or separate hemorrhage in the more deep white matter. There is surrounding edema with local mass effect; no midline shift or herniation. In this location, hemorrhagic infarct or amyloid angiopathy are the most likely causes.  Age advanced chronic small vessel ischemic injury with diffuse cerebral white matter low-attenuation. No hydrocephalus. Generalized cerebral volume loss.  Critical Value/emergent results were called by telephone at the time of interpretation on 10/01/2013 at 3:54 PM to Dr. Ezequiel Essex , who verbally acknowledged these results.  IMPRESSION: 1. 4 x 3 cm left temporal/parietal parenchymal hematoma. 2. 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 3. Diffuse inflammatory  paranasal sinus disease.   Electronically Signed   By: Jorje Guild M.D.   On: 10/01/2013 15:59   Ct Angio Chest Pe W/cm &/or Wo Cm  10/04/2013   CLINICAL DATA:  Abnormal echocardiogram. Question pulmonary embolus.  EXAM: CT ANGIOGRAPHY CHEST WITH CONTRAST  TECHNIQUE: Multidetector CT imaging of the chest was performed using the standard protocol during bolus administration of intravenous contrast. Multiplanar CT image reconstructions and MIPs were obtained to evaluate the vascular anatomy.  CONTRAST:  141mL OMNIPAQUE IOHEXOL 350 MG/ML SOLN  COMPARISON:  One-view chest x-ray 10/04/2013.  FINDINGS: Pulmonary retro opacification is suboptimal. There is a significant filling defect within the right interlobar artery suggesting pulmonary embolus. There is some filling defect suspected in the right upper lobe as well on the of this area is degraded by patient breathing motion.  Scattered areas of airspace consolidation are noted. There is focal airspace disease in the posterior and lateral left upper lobe. Dependent airspace disease is present bilaterally. Small bilateral pleural effusions are noted.  The heart is enlarged. Atherosclerotic calcifications are present in the coronary artery in the descending aorta.  The bone windows with demonstrate no focal lytic or blastic lesions. An NG tube is in place. The patient is intubated.  Limited imaging in the upper abdomen is unremarkable.  Review of the MIP images confirms the above findings.  IMPRESSION: 1. Right lower lobar pulmonary embolus. 2. Left upper lobe on bilateral lower lobe airspace disease. While this likely represents atelectasis, early infection is not excluded. 3. Mild cardiomegaly and atherosclerotic disease. 4. Support apparatus as described. Critical Value/emergent results were called by telephone at the time of interpretation on 10/04/2013 at 1:06 PM to Dr. Doree Fudge , who verbally acknowledged these results.   Electronically Signed    By: Lawrence Santiago M.D.   On: 10/04/2013 13:09   Dg Chest Port 1 View  10/06/2013   CLINICAL DATA:  Acute respiratory failure. Pulmonary embolism. Pleural effusions.  EXAM: PORTABLE CHEST - 1 VIEW  COMPARISON:  Chest x-ray dated 10/05/2013 and chest CT dated 10/04/2013  FINDINGS: Endotracheal tube, central venous catheter, and NG tube appear in good position. Pacemaker in place.  There is increased effusion and atelectasis at the right base. Tiny fusion and minimal atelectasis at the left base. Pulmonary vascularity is normal. Chronic cardiomegaly.  IMPRESSION: Increased right base atelectasis and effusion.   Electronically Signed   By: Rozetta Nunnery M.D.   On: 10/06/2013 08:51   Dg Chest Port 1 View  10/05/2013   CLINICAL DATA:  Intubation.  EXAM: PORTABLE CHEST - 1 VIEW  COMPARISON:  Chest radiograph October 04, 2013  FINDINGS: Endotracheal tube tip projects 3.3 cm above the equina. Left internal jugular central venous catheter with distal tip projecting in proximal superior vena cava. Nasogastric tube past the gastroesophageal junction though, distal tip is not imaged. Dual lead right cardiac pacemaker in situ.  Stable at least moderate cardiomegaly, and mediastinal silhouette is nonsuspicious, patient is rotated to the right. Right lung base volume loss, the cardiac silhouette is mildly shift of the right concerning for right middle lobe collapse. Mild patchy bibasilar airspace opacities with improved aeration of left lung base. Small pleural effusions. No pneumothorax.  Large body habitus. Osseous structures are nonsuspicious. No apparent change in position of life support lines.  IMPRESSION: Stable cardiomegaly, bibasilar airspace opacities with improved aeration of left lung base. Probable right middle lobe collapse. Small residual pleural effusions.   Electronically Signed   By: Elon Alas   On: 10/05/2013 06:15   Portable Chest Xray In Am  10/04/2013   CLINICAL DATA:  Cerebral infarction.  Nontraumatic intracranial hemorrhage. Acute respiratory failure.  EXAM: PORTABLE CHEST - 1 VIEW  COMPARISON:  10/03/2013  FINDINGS: Improved aeration noted in the right lung base. Persistent left retrocardiac atelectasis or consolidation demonstrated. Cardiomegaly is stable. Diffuse interstitial prominence again noted and mild interstitial edema cannot be excluded. No pneumothorax identified.  IMPRESSION: Decreased right basilar atelectasis. Otherwise no significant change.   Electronically Signed   By: Earle Gell M.D.   On: 10/04/2013 07:20   Dg Chest Portable 1 View  10/03/2013   CLINICAL DATA:  Intubation.  EXAM: PORTABLE CHEST - 1 VIEW  COMPARISON:  Chest 10/03/2013 at 5:32 a.m.  FINDINGS: Endotracheal tube is in place with tip in good position 3.9 cm above the carina. Left IJ catheter is again seen with the tip projecting over the mid superior vena cava. Right effusion basilar airspace disease persist. There is increased left basilar atelectasis. There is cardiomegaly. No pneumothorax.  IMPRESSION: ET tube in good position.  No change in right effusion and basilar airspace disease. Left basilar atelectasis is noted.   Electronically Signed   By: Inge Rise M.D.   On: 10/03/2013 20:22   Portable Chest Xray In Am  10/03/2013   CLINICAL DATA:  Hypoxia  EXAM: PORTABLE CHEST - 1 VIEW  COMPARISON:  October 02, 2013  FINDINGS: Endotracheal tube tip is 3.9 cm above the carina. Nasogastric tube tip and side port are in the stomach. Left jugular catheter tip is in the superior vena cava. No pneumothorax.  There is a right pleural effusion with right base atelectasis. Lungs are otherwise clear. Heart is mildly enlarged with normal pulmonary vascularity. Pacemaker leads are attached to the right atrium and right ventricle.  IMPRESSION: Fairly small right pleural effusion with right base atelectasis. Lungs are otherwise clear. Tube and catheter positions as described without pneumothorax.   Electronically  Signed   By: Lowella Grip M.D.   On: 10/03/2013 07:05   Dg Chest Port 1 View  10/02/2013   CLINICAL DATA:  Intubation.  Central line placement.  EXAM: PORTABLE CHEST - 1 VIEW  COMPARISON:  10/01/2013  FINDINGS: Endotracheal tube is 4 cm above the carina. Left internal jugular central line is in place with the tip directed laterally in the upper SVC. No pneumothorax.  There is cardiomegaly with vascular congestion and bibasilar opacities, likely a combination of effusions and atelectasis. Right pacer remains in place, unchanged.  IMPRESSION:  Endotracheal tube 4 cm above the carina. Left central line tip projected laterally towards the lateral wall of the upper SVC. No pneumothorax.  Cardiomegaly with vascular congestion.  Small effusions and bibasilar atelectasis.   Electronically Signed   By: Rolm Baptise M.D.   On: 10/02/2013 04:08   Chest Port 1 View  10/01/2013   CLINICAL DATA:  Cough, history of coronary artery disease and hypertension.  EXAM: PORTABLE CHEST - 1 VIEW  COMPARISON:  DG CHEST 1V PORT dated 09/05/2013  FINDINGS: The lungs are adequately inflated. There is no focal infiltrate. The cardiac silhouette remains enlarged. The pulmonary vascularity is more prominent today than previously. The permanent pacemaker appears unchanged. There is tortuosity of the descending thoracic aorta.  IMPRESSION: The appearance of the pulmonary interstitium suggests mild interstitial edema likely secondary to CHF. There is no evidence of pneumonia or pleural effusion.   Electronically Signed   By: David  Martinique   On: 10/01/2013 20:04   Dg Abd Portable 1v  10/04/2013   CLINICAL DATA:  Status post gastric catheter placement  EXAM: PORTABLE ABDOMEN - 1 VIEW  COMPARISON:  10/04/2013 603 hrs  FINDINGS: A nasogastric catheter is again noted within the stomach. It is directed towards of pyloric channel. Chronic changes in the left base are again seen.  IMPRESSION: Gastric catheter within the distal stomach.  These  results were called by telephone at the time of interpretation on 10/04/2013 at 10:35 AM to Margaretha Sheffield, the patient's nurse who verbally acknowledged these results.   Electronically Signed   By: Inez Catalina M.D.   On: 10/04/2013 10:35   Dg Abd Portable 1v  10/04/2013   CLINICAL DATA:  Orogastric tube placement.  EXAM: PORTABLE ABDOMEN - 1 VIEW  COMPARISON:  DG ABD PORTABLE 1V dated 10/02/2013  FINDINGS: Orogastric tube tip noted below the left hemidiaphragm in the upper portion of the left upper quadrant. Basilar atelectasis. Cardiomegaly. No free air.  IMPRESSION: 1. An orogastric tube noted with its tip below the left hemidiaphragm.  2.  Basilar atelectasis.  3. Cardiomegaly .   Electronically Signed   By: Marcello Moores  Register   On: 10/04/2013 07:29   Dg Abd Portable 1v  10/02/2013   CLINICAL DATA:  Orogastric tube placement  EXAM: PORTABLE ABDOMEN - 1 VIEW  COMPARISON:  None.  FINDINGS: Orogastric tube tip and side port are in the stomach. Bowel gas pattern is overall unremarkable. No free air seen on this supine examination.  IMPRESSION: Orogastric tube tip and side port in stomach.   Electronically Signed   By: Lowella Grip M.D.   On: 10/02/2013 10:03   Ct Portable Head W/o Cm  10/02/2013   CLINICAL DATA:  Altered mental status. Follow-up intracranial hemorrhage.  EXAM: CT HEAD WITHOUT CONTRAST  TECHNIQUE: Contiguous axial images were obtained from the base of the skull through the vertex without intravenous contrast.  COMPARISON:  CT of the head October 01, 2013  FINDINGS: Skull base/posterior fossa is not imaged on today's study.  Similar appearance of left temporoparietal 2.8 x 4 cm (previously 2.8 x 4.1 cm) intraparenchymal hematoma with surrounding low-density vasogenic edema. Local mass effect without midline shift. No new hemorrhage. No hydrocephalus.  Patchy to confluent supratentorial white matter hypodensities exclusive of the vasogenic edema suggest chronic small vessel ischemic disease. No  acute large vascular territory infarct.  No abnormal extra-axial fluid collections. Pan paranasal sinusitis. Status post bilateral ocular lens implants, with apparent proptosis, recommend clinical correlation.  IMPRESSION: Limited portable  CT: Skullbase and posterior fossa not been included.  Evolving left temporoparietal intraparenchymal hematoma with similar mass effect. No new hemorrhage.   Electronically Signed   By: Elon Alas   On: 10/02/2013 06:36    ASSESSMENT AND PLAN 1stroke complicated by hemorrhagic transformation 2. Pulmonary embolism with clot in the RA/RV 3. Morbid obesity Rec: note plans for TEE. He has a very bad combination of problems and I suspect he will not survive. Continue supportive care. I know of no way to treat his clot in the setting of intracranial bleed. He is not a candidate for open heart procedure (clot removal). At some point, hospice would be a consideration.  Gregg Taylor,M.D.  10/06/2013 11:38 AM

## 2013-10-06 NOTE — Progress Notes (Signed)
PT Cancellation Note / Discharge  Patient Details Name: [name] MRN: [id number] DOB: Feb 24, 1946   Cancelled Treatment:    Reason Eval/Treat Not Completed: Patient not medically ready (pt remains sedated on vent with PE. Await new order to initiate evaluation)   Melford Aase 10/06/2013, 8:20 AM Elwyn Reach, [place]

## 2013-10-06 NOTE — Progress Notes (Addendum)
Stroke Team Progress Note  HISTORY [name] is an 68 y.o. male who lives with a roommate. Roommate felt that patient was not at his baseline today and this morning felt hat he was hallucinating. He went to his day program where they noticed that he was not talking right. EMS was called at that time. Last known well 09/30/2013 at 22:30. Patient was not administerd TPA secondary to delay in arrival. He was admitted to the neuro ICU for further evaluation and treatment.  SUBJECTIVE No family is at bedside. The patient remains sedated as he is agitated off sedation, difficulty managing ventilator.  OBJECTIVE Most recent Vital Signs: Filed Vitals:   10/06/13 0500 10/06/13 0600 10/06/13 0700 10/06/13 0733  BP: 156/98 159/108 174/111 185/111  Pulse: 77 70 72 70  Temp:      TempSrc:      Resp: 16 10 17 15   Height:      Weight:      SpO2: 100% 100% 100% 100%   CBG (last 3)   Recent Labs  10/05/13 2019 10/05/13 2346 10/06/13 0457  GLUCAP 153* 160* 130*    IV Fluid Intake:   . sodium chloride 20 mL/hr at 10/05/13 2140  . sodium chloride    . sodium chloride    . sodium chloride    . propofol 25 mcg/kg/min (10/06/13 0320)    MEDICATIONS  . amiodarone  200 mg Oral Daily  . antiseptic oral rinse  15 mL Mouth Rinse QID  . atorvastatin  10 mg Oral q1800  . brimonidine  1 drop Both Eyes TID  . carvedilol  25 mg Per Tube BID WC  . chlorhexidine  15 mL Mouth Rinse BID  . cloNIDine  0.1 mg Per Tube BID  . dorzolamide-timolol  1 drop Both Eyes BID  . enoxaparin (LOVENOX) injection  40 mg Subcutaneous Q24H  . feeding supplement (PRO-STAT SUGAR FREE 64)  60 mL Per Tube QID  . feeding supplement (VITAL HIGH PROTEIN)  1,000 mL Per Tube Q24H  . insulin aspart  0-15 Units Subcutaneous 6 times per day  . isosorbide mononitrate  30 mg Oral Daily  . magnesium oxide  400 mg Oral Daily  . multivitamin  5 mL Oral Daily  . pantoprazole (PROTONIX) IV  40 mg Intravenous QHS  .  piperacillin-tazobactam (ZOSYN)  IV  3.375 g Intravenous Q8H  . pneumococcal 23 valent vaccine  0.5 mL Intramuscular Tomorrow-1000  . polyethylene glycol  17 g Oral Daily  . polyvinyl alcohol  1 drop Both Eyes BID  . senna-docusate  1 tablet Oral BID  . vancomycin  1,250 mg Intravenous Q12H   PRN:  acetaminophen, acetaminophen, fentaNYL, labetalol  Diet:  NPO  Activity:  As tolerated DVT Prophylaxis:  SCDs   CLINICALLY SIGNIFICANT STUDIES Basic Metabolic Panel:   Recent Labs Lab 10/05/13 0500 10/06/13 0325  NA 142 142  K 3.3* 3.8  CL 104 105  CO2 24 25  GLUCOSE 130* 141*  BUN 19 25*  CREATININE 1.18 1.14  CALCIUM 8.6 8.8   Liver Function Tests:   Recent Labs Lab 10/01/13 1504 10/04/13 0758  AST 51* 18  ALT 77* 30  ALKPHOS 143* 110  BILITOT 0.2* 0.7  PROT 7.3 6.9  ALBUMIN 2.6* 2.4*   CBC:   Recent Labs Lab 10/01/13 1504  10/05/13 0500 10/06/13 0325  WBC 8.0  < > 11.4* 8.8  NEUTROABS 4.8  --   --   --   HGB 11.2*  < >  10.6* 10.9*  HCT 34.8*  < > 33.4* 34.4*  MCV 80.9  < > 80.9 81.5  PLT 220  < > 143* 168  < > = values in this interval not displayed. Coagulation:   Recent Labs Lab 10/01/13 1504  LABPROT 14.0  INR 1.10   Cardiac Enzymes:   Recent Labs Lab 10/01/13 1504  TROPONINI <0.30   Urinalysis:   Recent Labs Lab 10/01/13 1915  COLORURINE YELLOW  LABSPEC 1.013  PHURINE 7.5  GLUCOSEU NEGATIVE  HGBUR NEGATIVE  BILIRUBINUR NEGATIVE  KETONESUR NEGATIVE  PROTEINUR NEGATIVE  UROBILINOGEN 0.2  NITRITE NEGATIVE  LEUKOCYTESUR NEGATIVE   Lipid Panel    Component Value Date/Time   CHOL 134 10/05/2013 0500   TRIG 152* 10/05/2013 0500   TRIG 149 10/05/2013 0500   HDL 37* 10/05/2013 0500   CHOLHDL 3.6 10/05/2013 0500   VLDL 30 10/05/2013 0500   LDLCALC 67 10/05/2013 0500   HgbA1C  Lab Results  Component Value Date   HGBA1C 8.2* 10/02/2013    Urine Drug Screen:     Component Value Date/Time   LABOPIA NONE DETECTED 10/01/2013 1915    COCAINSCRNUR NONE DETECTED 10/01/2013 1915   COCAINSCRNUR NEG 12/16/2008 2246   LABBENZ NONE DETECTED 10/01/2013 1915   LABBENZ NEG 12/16/2008 2246   AMPHETMU NONE DETECTED 10/01/2013 1915   AMPHETMU NEG 12/16/2008 2246   THCU NONE DETECTED 10/01/2013 1915   LABBARB NONE DETECTED 10/01/2013 1915    Alcohol Level:   Recent Labs Lab 10/01/13 1504  ETH <11   CT of the brain   10/05/2013 Contracting left temporoparietal intraparenchymal hematoma was 2.7 x 3.8 cm, now 2.2 x 3.1 cm without rebleed. 10/03/2013 Contracting left temporal parietal intraparenchymal hematoma, no rebleed. Moderate white matter changes suggest chronic small vessel ischemic disease. 10/02/2013   Limited portable CT: Skullbase and posterior fossa not been included.  Evolving left temporoparietal intraparenchymal hematoma with similar mass effect. No new hemorrhage.   10/01/2013    1. 4 x 3 cm left temporal/parietal parenchymal hematoma. 2. 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 3. Diffuse inflammatory paranasal sinus disease.     MRI/MRA head pacemaker  2D Echo There is a mass present in the right atrium and right ventricle. It is not seen in the IVC. It was not present on the study of May, 2014. Considering all factors, the most likely etiology is clot. The mass is seen in the RA. There is also mass that moves across the tricuspid valve. There is probably also mass in the RV cavity. I can not be sure, but all of these targets might be connected.  Carotid Doppler  CT angio chest 1. Right lower lobar pulmonary embolus.  2. Left upper lobe on bilateral lower lobe airspace disease. While  this likely represents atelectasis, early infection is not excluded.  3. Mild cardiomegaly and atherosclerotic disease.  4. Support apparatus as described.  CXR   10/05/2013 Stable cardiomegaly, bibasilar airspace opacities with improved  aeration of left lung base. Probable right middle lobe collapse. Small residual pleural  effusions.  10/04/2013 Decreased right basilar atelectasis. Otherwise no significant  change. 10/03/2013 Fairly small right pleural effusion with right base atelectasis. Lungs are otherwise clear. Tube and catheter positions as described without pneumothorax. 10/02/2013    Endotracheal tube 4 cm above the carina. Left central line tip projected laterally towards the lateral wall of the upper SVC. No pneumothorax.  Cardiomegaly with vascular congestion.  Small effusions and bibasilar atelectasis.  10/01/2013   The appearance of the pulmonary interstitium suggests mild interstitial edema likely secondary to CHF. There is no evidence of pneumonia or pleural effusion.     EKG  Sinus or ectopic atrial rhythm. Prolonged PR interval. Anterior infarct, old. No significant change was found.    Therapy Recommendations   Physical Exam  General: The patient is obese. He is intubated.  Cardiovascular: Regular rate rhythm, heart sounds distant.  Abdomen: Large, obese, positive bowel sounds.  Skin: No significant peripheral edema is noted. 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   Neurologic Exam  Mental status: The patient is intubated and sedated.  Cranial nerves: Facial symmetry is present. The patient is intubated, sedated. Eyes are deviated slightly to the left, no blink to threat. Some facial grimace is noted with pain stimulation.  Motor: The patient has withdrawal movements on all 4 extremities.  Sensory examination: The patient will respond to deep pain stimulation with withdrawal on all 4 extremities.  Coordination: The  cerebellar testing cannot be performed.  Gait and station: The patient is on bedrest, the patient could not be ambulated.  Reflexes: Deep tendon reflexes are symmetric, but are depressed.   ASSESSMENT [name] is a 68 y.o. male presenting with altered speech. Imaging confirms a left temporal parietal IPH with significant surrounding edema - no hydrocephalus,  no [name]. Suspect underlying mass or ischemic infarct given amt of edema. Unable to get MRI to confirm due to pacer. Hemmorrhage stable. Has developed respirator insufficiency due to PE from RA clot. Not an anticoagulation candidate for treatment due to [place].  On aspirin 81 mg orally every day prior to admission. Patient purposeful, localizes; global aphasia, right hemirparesis, dysphagia.  Right atrial clot, not a anticoagulation candidate due to hemorrhage RLL pulmonary embolus, not a anticoagulation candidate due to hemorrhage Respiratory failure, intubated  due to respiratory insufficiency, low sats after admission. Extubated/reintubated 2/18 am due to pulmonary edema vs aspiration pneumonia. Started on vanc and zosyn. Respiratory problems now known to be due to PE from RA clot. atrial fibrillation, not a coumadin candidate secondary to hemorrhage. Not on prior to admission due to hx rectal bleeding. hypertension Hyperlipidemia, on crestor 40 mg daily PTA, now on no statin as NPO Diabetes, uncontrolled, HgbA1c 8.2, goal 7.0 obstructive sleep apnea Obesity, Body mass index is 38.53 kg/(m^2).   Pacemaker  Legally blind  Herpes  LDL [facility] day # 5  TREATMENT/PLAN  Continue ICU level care  CCM following for ventilator management  Cardiology following TEE for Monday  Repeat CT Monday  Likely SNF at time of discharge  Veinous doppler studies  ICH is stable, resolving slightly  Lenor Coffin [id number] 10/06/2013 8:30 AM

## 2013-10-06 NOTE — Progress Notes (Signed)
ANTIBIOTIC CONSULT NOTE - FOLLOW UP  Pharmacy Consult for Zosyn and Vancomycin Indication: rule out pneumonia  No Known Allergies  Patient Measurements: Height: 6' (182.9 cm) Weight: 284 lb 2.8 oz (128.9 kg) IBW/kg (Calculated) : 77.6   Vital Signs: Temp: 98.2 F (36.8 C) (02/21 0910) Temp src: Axillary (02/21 0910) BP: 124/73 mmHg (02/21 1000) Pulse Rate: 70 (02/21 1000) Intake/Output from previous day: 02/20 0701 - 02/21 0700 In: 1973.5 [I.V.:953.5; NG/GT:370; IV Piggyback:650] Out: 1190 [Urine:1190] Intake/Output from this shift:    Labs:  Recent Labs  10/04/13 0758 10/05/13 0500 10/06/13 0325  WBC 13.3* 11.4* 8.8  HGB 10.9* 10.6* 10.9*  PLT 143* 143* 168  CREATININE 1.38* 1.18 1.14   Estimated Creatinine Clearance: 89.6 ml/min (by C-G formula based on Cr of 1.14).  Recent Labs  10/06/13 0923  VANCOTROUGH 23.3*     Microbiology: Recent Results (from the past 720 hour(s))  MRSA PCR SCREENING     Status: None   Collection Time    10/01/13  6:33 PM      Result Value Ref Range Status   MRSA by PCR NEGATIVE  NEGATIVE Final   Comment:            The GeneXpert MRSA Assay (FDA     approved for NASAL specimens     only), is one component of a     comprehensive MRSA colonization     surveillance program. It is not     intended to diagnose MRSA     infection nor to guide or     monitor treatment for     MRSA infections.  CULTURE, BLOOD (ROUTINE X 2)     Status: None   Collection Time    10/03/13 11:28 PM      Result Value Ref Range Status   Specimen Description BLOOD LEFT ARM   Final   Special Requests BOTTLES DRAWN AEROBIC ONLY 10CC   Final   Culture  Setup Time     Final   Value: 10/04/2013 04:24     Performed at [facility]   Culture     Final   Value:        BLOOD CULTURE RECEIVED NO GROWTH TO DATE CULTURE WILL BE HELD FOR 5 DAYS BEFORE ISSUING A FINAL NEGATIVE REPORT     Performed at [facility]   Report Status PENDING    Incomplete  CULTURE, BLOOD (ROUTINE X 2)     Status: None   Collection Time    10/03/13 11:32 PM      Result Value Ref Range Status   Specimen Description BLOOD RIGHT ARM   Final   Special Requests BOTTLES DRAWN AEROBIC ONLY 10CC   Final   Culture  Setup Time     Final   Value: 10/04/2013 04:23     Performed at [facility]   Culture     Final   Value:        BLOOD CULTURE RECEIVED NO GROWTH TO DATE CULTURE WILL BE HELD FOR 5 DAYS BEFORE ISSUING A FINAL NEGATIVE REPORT     Performed at [facility]   Report Status PENDING   Incomplete  CULTURE, RESPIRATORY (NON-EXPECTORATED)     Status: None   Collection Time    10/04/13 12:50 AM      Result Value Ref Range Status   Specimen Description TRACHEAL ASPIRATE   Final   Special Requests Normal   Final   Gram Stain  Final   Value: RARE WBC PRESENT, PREDOMINANTLY PMN     NO SQUAMOUS EPITHELIAL CELLS SEEN     NO ORGANISMS SEEN     Performed at [facility]   Culture     Final   Value: NO GROWTH 1 DAY     Performed at [facility]   Report Status PENDING   Incomplete    Anti-infectives   Start     Dose/Rate Route Frequency Ordered Stop   10/04/13 1000  vancomycin (VANCOCIN) 1,250 mg in sodium chloride 0.9 % 250 mL IVPB  Status:  Discontinued     1,250 mg 166.7 mL/hr over 90 Minutes Intravenous Every 12 hours 10/03/13 2056 10/06/13 1027   10/03/13 2100  piperacillin-tazobactam (ZOSYN) IVPB 3.375 g     3.375 g 100 mL/hr over 30 Minutes Intravenous  Once 10/03/13 2056 10/03/13 2159   10/03/13 2100  vancomycin (VANCOCIN) 2,500 mg in sodium chloride 0.9 % 500 mL IVPB     2,500 mg 250 mL/hr over 120 Minutes Intravenous  Once 10/03/13 2056 10/03/13 2345   10/03/13 0500  piperacillin-tazobactam (ZOSYN) IVPB 3.375 g     3.375 g 12.5 mL/hr over 240 Minutes Intravenous Every 8 hours 10/03/13 2056        Assessment: 40 YOM admitted with [place] on 2/16 who then had frothy grey and tan secretions with concern  for aspiration PNA and was started on empiric antibiotics. WBC have trended down to WNL, afebrile. SCr has improved to 1.14mg /dL with est CrCl ~64mL/min. Vancomycin trough drawn this morning was elevated at 23.36mcg/mL. Confirmed with RN that she has not hung the next dose.  Goal of Therapy:  Vancomycin trough level 15-20 mcg/ml  Plan:  1. Continue Zosyn 3.375gm IV q8h EI 2. Reduce vancomycin dose to 1000mg  IV q12h 3. Follow renal function, c/s, clinical progression, trough at next steady state  Deen Deguia D. Fatmata Legere, PharmD, BCPS Clinical Pharmacist Pager: [phone] 10/06/2013 10:48 AM

## 2013-10-06 NOTE — Progress Notes (Signed)
VASCULAR LAB PRELIMINARY  PRELIMINARY  PRELIMINARY  PRELIMINARY  Bilateral lower extremity venous Dopplers completed.    Preliminary report:  There is no evidence of DVT or SVT in the bilateral lower extremities.  Amir Glaus, RVT 10/06/2013, 10:41 AM

## 2013-10-06 NOTE — Progress Notes (Signed)
PULMONARY / CRITICAL CARE MEDICINE  Name: [name] MRN: [id number] DOB: 1946/07/29    ADMISSION DATE:  10/01/2013 CONSULTATION DATE:  10/02/2013  REFERRING MD :  Stroke PRIMARY SERVICE: Stroke  CHIEF COMPLAINT:  Acute respiratory failure   BRIEF PATIENT DESCRIPTION: 68 year old male with multiple co-morbids including suspected OSA/OHS. Admitted for left temporal / parietal ICH on 2/16. Developed decrease LOC and progressive respiratory failure early in the am on 2/17. PCCM asked to see emergently in consult.   SIGNIFICANT EVENTS / STUDIES:  2/16  Head CT >>> 1. 4 x 3 cm left temporal/parietal parenchymal hematoma. 2. 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 3. Diffuse inflammatory paranasal sinus disease. 2/17  Head CT >>>  Evolving left temporoparietal intraparenchymal hematoma with similar mass effect. No new hemorrhage. 2/18  Head CT >>>  Contracting left temporal parietal intraparenchymal hematoma, no rebleed. Moderate white matter changes suggest chronic small vessel ischemic disease. 2/18  Extubated / re-intubated due to pulmonary edema vs aspiration pneumonia vs other 2/19  TTE >>> mobile mass in RA on the pacer wire, likely clot (vs vegetation) 2/19  CT-PA >> R LL acute PE, LUL and BLL atx vs infiltrates  LINES / TUBES: L IJ CVL 2/17 >>> OETT 2/17 >>> 2/18; 2/18 >>> OGT 1/17 >>> 2/18; 2/18 >>> Foley 2/17 >>>  CULTURES: Blood 2/18 >>  resp 2/19 >>   ANTIBIOTICS: Zosyn 2/18 >>> Vancomycin 2/18 >>>  SUBJECTIVE:   Remains intubated, follows some commands  VITAL SIGNS: Temp:  [97.4 F (36.3 C)-98.7 F (37.1 C)] 98.7 F (37.1 C) (02/21 0400) Pulse Rate:  [70-77] 70 (02/21 0733) Resp:  [10-27] 15 (02/21 0733) BP: (86-186)/(61-111) 185/111 mmHg (02/21 0733) SpO2:  [100 %] 100 % (02/21 0733) FiO2 (%):  [40 %] 40 % (02/21 0733) Weight:  [128.9 kg (284 lb 2.8 oz)] 128.9 kg (284 lb 2.8 oz) (02/21 0346)  HEMODYNAMICS:   VENTILATOR  SETTINGS: Vent Mode:  [-] CPAP;PSV FiO2 (%):  [40 %] 40 % Set Rate:  [16 bmp] 16 bmp Vt Set:  [670 mL] 670 mL PEEP:  [5 cmH20] 5 cmH20 Pressure Support:  [10 cmH20] 10 cmH20 Plateau Pressure:  [18 cmH20-19 cmH20] 19 cmH20  INTAKE / OUTPUT: Intake/Output     02/20 0701 - 02/21 0700 02/21 0701 - 02/22 0700   I.V. (mL/kg) 953.5 (7.4)    NG/GT 370    IV Piggyback 650    Total Intake(mL/kg) 1973.5 (15.3)    Urine (mL/kg/hr) 1190 (0.4)    Total Output 1190     Net +783.5           PHYSICAL EXAMINATION: General:  Mechanically ventilated, synchronous Neuro:  Sedated, gag / cough diminished HEENT:  OETT / OGT intact, PERRL Cardiovascular:  Regular, no m/g/r noted Lungs:  Bilateral air entry, few rhonchi Abdomen:  Obese, soft, nontender, nondistended +bs Musculoskeletal:  Trace edema Skin:  Intact   LABS:  CBC  Recent Labs Lab 10/04/13 0758 10/05/13 0500 10/06/13 0325  WBC 13.3* 11.4* 8.8  HGB 10.9* 10.6* 10.9*  HCT 33.6* 33.4* 34.4*  PLT 143* 143* 168   Coag's  Recent Labs Lab 10/01/13 1504  APTT 32  INR 1.10   BMET  Recent Labs Lab 10/04/13 0758 10/05/13 0500 10/06/13 0325  NA 140 142 142  K 3.3* 3.3* 3.8  CL 95* 104 105  CO2 22 24 25   BUN 16 19 25*  CREATININE 1.38* 1.18 1.14  GLUCOSE 122* 130* 141*  Electrolytes  Recent Labs Lab 10/04/13 0758 10/05/13 0500 10/06/13 0325  CALCIUM 8.4 8.6 8.8   Sepsis Markers  Recent Labs Lab 10/04/13 0758 10/05/13 0500 10/06/13 0325  PROCALCITON 0.28 0.19 0.12    ABG  Recent Labs Lab 10/02/13 0001 10/02/13 0209 10/02/13 0353  PHART 7.403 7.197* 7.313*  PCO2ART 33.9* 74.2* 49.3*  PO2ART 144.0* 74.5* 90.5   Liver Enzymes  Recent Labs Lab 10/01/13 1504 10/04/13 0758  AST 51* 18  ALT 77* 30  ALKPHOS 143* 110  BILITOT 0.2* 0.7  ALBUMIN 2.6* 2.4*   Cardiac Enzymes  Recent Labs Lab 10/01/13 1504  TROPONINI <0.30   Glucose  Recent Labs Lab 10/05/13 0739 10/05/13 1125  10/05/13 1616 10/05/13 2019 10/05/13 2346 10/06/13 0457  GLUCAP 123* 165* 142* 153* 160* 130*   Imaging Ct Head Wo Contrast  10/05/2013   CLINICAL DATA:  Follow-up stroke.  EXAM: CT HEAD WITHOUT CONTRAST  TECHNIQUE: Contiguous axial images were obtained from the base of the skull through the vertex without intravenous contrast.  COMPARISON:  CT HEAD W/O CM dated 10/03/2013; CT PORT HEAD WO CM dated 10/02/2013  FINDINGS: Left temporoparietal 2.2 x 3.1 cm intraparenchymal hematoma was 2.7 x 3.8 cm. Minimal surrounding low-density vasogenic edema with mild local mass effect. No rebleed.  The ventricles and sulci are overall normal for patient's age. Mild disproportionate prominence of the cerebellar folia. Patchy supratentorial white matter hypodensities exclusive of the vasogenic edema suggest chronic small vessel ischemic disease, stable. No acute large vascular territory infarct.  No abnormal extra-axial fluid collections. Basal cisterns are patent. Dolicoectatic basilar artery suggest chronic hypertension with moderate calcific atherosclerosis of the intracranial vessels.  Chronic paranasal sinus mucosal thickening, similar. Mastoid air cells are well aerated. Soft tissue within the external auditory canal likely reflects cerumen. Suspected proptosis, recommend clinical correlation.  IMPRESSION: Contracting left temporoparietal intraparenchymal hematoma was 2.7 x 3.8 cm, now 2.2 x 3.1 cm without rebleed.   Electronically Signed   By: Elon Alas   On: 10/05/2013 04:31   Ct Angio Chest Pe W/cm &/or Wo Cm  10/04/2013   CLINICAL DATA:  Abnormal echocardiogram. Question pulmonary embolus.  EXAM: CT ANGIOGRAPHY CHEST WITH CONTRAST  TECHNIQUE: Multidetector CT imaging of the chest was performed using the standard protocol during bolus administration of intravenous contrast. Multiplanar CT image reconstructions and MIPs were obtained to evaluate the vascular anatomy.  CONTRAST:  165mL OMNIPAQUE IOHEXOL 350  MG/ML SOLN  COMPARISON:  One-view chest x-ray 10/04/2013.  FINDINGS: Pulmonary retro opacification is suboptimal. There is a significant filling defect within the right interlobar artery suggesting pulmonary embolus. There is some filling defect suspected in the right upper lobe as well on the of this area is degraded by patient breathing motion.  Scattered areas of airspace consolidation are noted. There is focal airspace disease in the posterior and lateral left upper lobe. Dependent airspace disease is present bilaterally. Small bilateral pleural effusions are noted.  The heart is enlarged. Atherosclerotic calcifications are present in the coronary artery in the descending aorta.  The bone windows with demonstrate no focal lytic or blastic lesions. An NG tube is in place. The patient is intubated.  Limited imaging in the upper abdomen is unremarkable.  Review of the MIP images confirms the above findings.  IMPRESSION: 1. Right lower lobar pulmonary embolus. 2. Left upper lobe on bilateral lower lobe airspace disease. While this likely represents atelectasis, early infection is not excluded. 3. Mild cardiomegaly and atherosclerotic disease. 4. Support apparatus  as described. Critical Value/emergent results were called by telephone at the time of interpretation on 10/04/2013 at 1:06 PM to Dr. Doree Fudge , who verbally acknowledged these results.   Electronically Signed   By: Lawrence Santiago M.D.   On: 10/04/2013 13:09   Dg Chest Port 1 View  10/05/2013   CLINICAL DATA:  Intubation.  EXAM: PORTABLE CHEST - 1 VIEW  COMPARISON:  Chest radiograph October 04, 2013  FINDINGS: Endotracheal tube tip projects 3.3 cm above the equina. Left internal jugular central venous catheter with distal tip projecting in proximal superior vena cava. Nasogastric tube past the gastroesophageal junction though, distal tip is not imaged. Dual lead right cardiac pacemaker in situ.  Stable at least moderate cardiomegaly, and  mediastinal silhouette is nonsuspicious, patient is rotated to the right. Right lung base volume loss, the cardiac silhouette is mildly shift of the right concerning for right middle lobe collapse. Mild patchy bibasilar airspace opacities with improved aeration of left lung base. Small pleural effusions. No pneumothorax.  Large body habitus. Osseous structures are nonsuspicious. No apparent change in position of life support lines.  IMPRESSION: Stable cardiomegaly, bibasilar airspace opacities with improved aeration of left lung base. Probable right middle lobe collapse. Small residual pleural effusions.   Electronically Signed   By: Elon Alas   On: 10/05/2013 06:15   Dg Abd Portable 1v  10/04/2013   CLINICAL DATA:  Status post gastric catheter placement  EXAM: PORTABLE ABDOMEN - 1 VIEW  COMPARISON:  10/04/2013 603 hrs  FINDINGS: A nasogastric catheter is again noted within the stomach. It is directed towards of pyloric channel. Chronic changes in the left base are again seen.  IMPRESSION: Gastric catheter within the distal stomach.  These results were called by telephone at the time of interpretation on 10/04/2013 at 10:35 AM to Margaretha Sheffield, the patient's nurse who verbally acknowledged these results.   Electronically Signed   By: Inez Catalina M.D.   On: 10/04/2013 10:35   ASSESSMENT / PLAN:  PULMONARY A:  Acute respiratory failure, re intubated 2/18 suspect due to PE Possible PNA vs atelectasis Acute RLL PE Suspected OSA/OHS P:   Continuous mechanical support VAP bundle Daily SBT cpap 5 pa 5, goal 2 hr, likely will need increase ps Discussed case with Dr Leonie Man 2/20 > anticoagulation contraindicated at this time, but clearly PE will need to be addressed or he will wean poorly. Plan for now will be to wait and watch off anticoag, repeat head CT scan beginning of next week. If [place] looks to be organizing then will try starting heparin gtt at that time. Clearly there are risks/benefits on each side.   Consider IVC filter- will get LE doppler to assess for clot source.  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 P:  Goal SBP <160, start PRN hydralazine ordered Continue Amiodarone, Lipitor, Coreg, Clonidine, Imdur - consider dose increase or add Metoprolol??? BP range 170-190s now- re eval over next 8 hrs Empiric abx for possibility of endocarditis/ RA vegetation Options for addressing RA thrombus discussed with TCTS and IR by Dr Oliver Pila on 2/19 >> Unable to consider thrombectomy when he cannot be heparinized; not a candidate for directed t-PA by IR due to risks of worsening ICH.  Hold ASA Labetalol PRN  RENAL A:   CKD stage 3 Hypokalemia - resolved P:  Trend BMP Replete electrolytes as indicated  GASTROINTESTINAL A:   Obesity Mild elevated LFTs, resolved GERD on PPI preadmission Nutrition P:   TF per Nutritionist, increased TF to 75mL/hr Protonix  HEMATOLOGIC A:   Mild anemia  P:  Trend CBC   INFECTIOUS A:   Possible pneumonia vs atx, Pct is equivocal. Concern for Endocarditis- await BC results. P:   Empiric Abx for concern veg Not needing abx for prolonged pNA  ENDOCRINE A:   DM  P:   SSI  NEUROLOGIC A: Left temp/parietal ICH w associated edema Acute encephalopathy P:   Appreciate stroke team assistance Goal RASS 0 to -1 Propofol gtt Fentanyl PRN   Melissa A Holmes PA-S  10/06/2013, 8:36 AM  I have fully examined this patient and agree with above findings.    And edited in full  Ccm time 30 min   Lavon Paganini. Titus Mould, MD, [place] Pgr: [facility]

## 2013-10-07 ENCOUNTER — Inpatient Hospital Stay (HOSPITAL_COMMUNITY): Payer: Medicare Other

## 2013-10-07 LAB — BASIC METABOLIC PANEL
BUN: 26 mg/dL — AB (ref 6–23)
BUN: 24 mg/dL — AB (ref 6–23)
CALCIUM: 8.8 mg/dL (ref 8.4–10.5)
CALCIUM: 8.8 mg/dL (ref 8.4–10.5)
CO2: 26 mEq/L (ref 19–32)
CO2: 25 meq/L (ref 19–32)
Chloride: 103 mEq/L (ref 96–112)
Chloride: 104 mEq/L (ref 96–112)
Creatinine, Ser: 1.01 mg/dL (ref 0.50–1.35)
Creatinine, Ser: 0.95 mg/dL (ref 0.50–1.35)
GFR calc Af Amer: 88 mL/min — ABNORMAL LOW (ref >90)
GFR calc Af Amer: >90 mL/min (ref >90)
GFR, EST NON AFRICAN AMERICAN: 76 mL/min — AB (ref >90)
GFR, EST NON AFRICAN AMERICAN: 85 mL/min — AB (ref >90)
GLUCOSE: 176 mg/dL — AB (ref 70–99)
Glucose, Bld: 158 mg/dL — ABNORMAL HIGH (ref 70–99)
Potassium: 3.4 mEq/L — ABNORMAL LOW (ref 3.7–5.3)
Potassium: 3.3 mEq/L — ABNORMAL LOW (ref 3.7–5.3)
SODIUM: 139 meq/L (ref 137–147)
Sodium: 139 mEq/L (ref 137–147)

## 2013-10-07 LAB — CBC WITH DIFFERENTIAL/PLATELET
BASOS PCT: 0 % (ref 0–1)
Basophils Absolute: 0 10*3/uL (ref 0.0–0.1)
EOS PCT: 6 % — AB (ref 0–5)
Eosinophils Absolute: 0.5 10*3/uL (ref 0.0–0.7)
HCT: 33.9 % — ABNORMAL LOW (ref 39.0–52.0)
Hemoglobin: 10.8 g/dL — ABNORMAL LOW (ref 13.0–17.0)
Lymphocytes Relative: 28 % (ref 12–46)
Lymphs Abs: 2.1 10*3/uL (ref 0.7–4.0)
MCH: 26 pg (ref 26.0–34.0)
MCHC: 31.9 g/dL (ref 30.0–36.0)
MCV: 81.5 fL (ref 78.0–100.0)
Monocytes Absolute: 0.8 10*3/uL (ref 0.1–1.0)
Monocytes Relative: 11 % (ref 3–12)
NEUTROS PCT: 55 % (ref 43–77)
Neutro Abs: 4 10*3/uL (ref 1.7–7.7)
PLATELETS: 183 10*3/uL (ref 150–400)
RBC: 4.16 MIL/uL — ABNORMAL LOW (ref 4.22–5.81)
RDW: 15.7 % — ABNORMAL HIGH (ref 11.5–15.5)
WBC: 7.4 10*3/uL (ref 4.0–10.5)

## 2013-10-07 LAB — GLUCOSE, CAPILLARY
GLUCOSE-CAPILLARY: 137 mg/dL — AB (ref 70–99)
GLUCOSE-CAPILLARY: 147 mg/dL — AB (ref 70–99)
GLUCOSE-CAPILLARY: 148 mg/dL — AB (ref 70–99)
Glucose-Capillary: 134 mg/dL — ABNORMAL HIGH (ref 70–99)
Glucose-Capillary: 146 mg/dL — ABNORMAL HIGH (ref 70–99)
Glucose-Capillary: 149 mg/dL — ABNORMAL HIGH (ref 70–99)
Glucose-Capillary: 170 mg/dL — ABNORMAL HIGH (ref 70–99)

## 2013-10-07 LAB — MAGNESIUM: Magnesium: 2.2 mg/dL (ref 1.5–2.5)

## 2013-10-07 MED ORDER — FUROSEMIDE 10 MG/ML IJ SOLN
40.0000 mg | Freq: Two times a day (BID) | INTRAMUSCULAR | Status: AC
  Administered 2013-10-07 – 2013-10-09 (×4): 40 mg via INTRAVENOUS
  Filled 2013-10-07 (×5): qty 4

## 2013-10-07 MED ORDER — HYDRALAZINE HCL 20 MG/ML IJ SOLN
10.0000 mg | INTRAMUSCULAR | Status: DC | PRN
  Administered 2013-10-07 – 2013-10-13 (×3): 10 mg via INTRAVENOUS
  Filled 2013-10-07 (×3): qty 1

## 2013-10-07 MED ORDER — POTASSIUM CHLORIDE ER 10 MEQ PO TBCR
20.0000 meq | EXTENDED_RELEASE_TABLET | Freq: Two times a day (BID) | ORAL | Status: DC
  Filled 2013-10-07 (×2): qty 2

## 2013-10-07 MED ORDER — DEXTROSE 5 % IV SOLN
1.0000 g | INTRAVENOUS | Status: AC
  Administered 2013-10-07 – 2013-10-11 (×5): 1 g via INTRAVENOUS
  Filled 2013-10-07 (×6): qty 10

## 2013-10-07 MED ORDER — POTASSIUM CHLORIDE 20 MEQ/15ML (10%) PO LIQD
20.0000 meq | Freq: Two times a day (BID) | ORAL | Status: DC
  Administered 2013-10-07 – 2013-10-09 (×4): 20 meq
  Filled 2013-10-07 (×5): qty 15

## 2013-10-07 NOTE — Progress Notes (Signed)
Called eLink & spoke to MD concerning pt's BP.  MD gave orders to repeat labetalol dose of 15mg .

## 2013-10-07 NOTE — Progress Notes (Signed)
Pt placed back on full support due to increased WOB. RT will monitor.

## 2013-10-07 NOTE — Progress Notes (Signed)
Called eLink MD due to pt's BP still being elevated.  MD gave orders for hydralazine.

## 2013-10-07 NOTE — Progress Notes (Signed)
Patient ID: [name], male   DOB: 1946-04-23, 68 y.o.   MRN: [id number]   Patient Name: [name] Date of Encounter: 10/07/2013     Active Problems:   CVA (cerebral infarction)   Intracerebral hemorrhage   Hypertension   ICH (intracerebral hemorrhage)   CVA (cerebrovascular accident due to intracerebral hemorrhage)   Acute respiratory failure   Acute pulmonary edema   Aspiration pneumonia   Cardiac mass    SUBJECTIVE Remains intubated and sedated but responds to commands when propofol reduced  CURRENT MEDS . amiodarone  200 mg Oral Daily  . antiseptic oral rinse  15 mL Mouth Rinse QID  . atorvastatin  10 mg Oral q1800  . brimonidine  1 drop Both Eyes TID  . carvedilol  25 mg Per Tube BID WC  . chlorhexidine  15 mL Mouth Rinse BID  . cloNIDine  0.1 mg Per Tube BID  . dorzolamide-timolol  1 drop Both Eyes BID  . enoxaparin (LOVENOX) injection  40 mg Subcutaneous Q24H  . feeding supplement (PRO-STAT SUGAR FREE 64)  60 mL Per Tube QID  . feeding supplement (VITAL HIGH PROTEIN)  1,000 mL Per Tube Q24H  . insulin aspart  0-15 Units Subcutaneous 6 times per day  . isosorbide dinitrate  10 mg Oral BID  . magnesium oxide  400 mg Oral Daily  . multivitamin  5 mL Oral Daily  . pantoprazole (PROTONIX) IV  40 mg Intravenous QHS  . piperacillin-tazobactam (ZOSYN)  IV  3.375 g Intravenous Q8H  . pneumococcal 23 valent vaccine  0.5 mL Intramuscular Tomorrow-1000  . polyethylene glycol  17 g Oral Daily  . polyvinyl alcohol  1 drop Both Eyes BID  . potassium chloride  20 mEq Per Tube BID  . senna-docusate  1 tablet Oral BID  . vancomycin  1,000 mg Intravenous Q12H    OBJECTIVE  Filed Vitals:   10/07/13 0721 10/07/13 0800 10/07/13 0900 10/07/13 1000  BP: 164/99 143/82 128/78 113/67  Pulse: 79 73 70 72  Temp:  97.8 F (36.6 C)    TempSrc:  Oral    Resp: 14 28 20 27   Height:      Weight:      SpO2: 98% 99% 100% 99%    Intake/Output Summary (Last 24 hours) at  10/07/13 1100 Last data filed at 10/07/13 0900  Gross per 24 hour  Intake 2275.75 ml  Output   1300 ml  Net 975.75 ml   Filed Weights   10/05/13 0500 10/06/13 0346 10/07/13 0119  Weight: 281 lb 8.4 oz (127.7 kg) 284 lb 2.8 oz (128.9 kg) 283 lb 4.7 oz (128.5 kg)    PHYSICAL EXAM  General: Pleasant, NAD. Neuro: Alert and oriented X 3. Moves all extremities spontaneously. Psych: Normal affect. HEENT:  Normal  Neck: Supple without bruits or JVD. Lungs:  Resp regular and unlabored, CTA. Heart: RRR no s3, s4, or murmurs. Abdomen: Soft, non-tender, non-distended, BS + x 4.  Extremities: No clubbing, cyanosis or edema. DP/PT/Radials 2+ and equal bilaterally.  Accessory Clinical Findings  CBC  Recent Labs  10/06/13 0325 10/07/13 0500  WBC 8.8 7.4  NEUTROABS  --  4.0  HGB 10.9* 10.8*  HCT 34.4* 33.9*  MCV 81.5 81.5  PLT 168 [id number]   Basic Metabolic Panel  Recent Labs  10/07/13 0145 10/07/13 0500  NA 139 139  K 3.4* 3.3*  CL 103 104  CO2 26 25  GLUCOSE 158* 176*  BUN 26* 24*  CREATININE 1.01  0.95  CALCIUM 8.8 8.8  MG 2.2  --    Liver Function Tests No results found for this basename: AST, ALT, ALKPHOS, BILITOT, PROT, ALBUMIN,  in the last 72 hours No results found for this basename: LIPASE, AMYLASE,  in the last 72 hours Cardiac Enzymes No results found for this basename: CKTOTAL, CKMB, CKMBINDEX, TROPONINI,  in the last 72 hours BNP No components found with this basename: POCBNP,  D-Dimer No results found for this basename: DDIMER,  in the last 72 hours Hemoglobin A1C No results found for this basename: HGBA1C,  in the last 72 hours Fasting Lipid Panel  Recent Labs  10/05/13 0500  CHOL 134  HDL 37*  LDLCALC 67  TRIG 152*  149  CHOLHDL 3.6   Thyroid Function Tests No results found for this basename: TSH, T4TOTAL, FREET3, T3FREE, THYROIDAB,  in the last 72 hours  TELE  NSR with atrial pacing. occaisional dropped beats due to the AAI-DDD mode and  heart block   Radiology/Studies  Ct Head Wo Contrast  10/05/2013   CLINICAL DATA:  Follow-up stroke.  EXAM: CT HEAD WITHOUT CONTRAST  TECHNIQUE: Contiguous axial images were obtained from the base of the skull through the vertex without intravenous contrast.  COMPARISON:  CT HEAD W/O CM dated 10/03/2013; CT PORT HEAD WO CM dated 10/02/2013  FINDINGS: Left temporoparietal 2.2 x 3.1 cm intraparenchymal hematoma was 2.7 x 3.8 cm. Minimal surrounding low-density vasogenic edema with mild local mass effect. No rebleed.  The ventricles and sulci are overall normal for patient's age. Mild disproportionate prominence of the cerebellar folia. Patchy supratentorial white matter hypodensities exclusive of the vasogenic edema suggest chronic small vessel ischemic disease, stable. No acute large vascular territory infarct.  No abnormal extra-axial fluid collections. Basal cisterns are patent. Dolicoectatic basilar artery suggest chronic hypertension with moderate calcific atherosclerosis of the intracranial vessels.  Chronic paranasal sinus mucosal thickening, similar. Mastoid air cells are well aerated. Soft tissue within the external auditory canal likely reflects cerumen. Suspected proptosis, recommend clinical correlation.  IMPRESSION: Contracting left temporoparietal intraparenchymal hematoma was 2.7 x 3.8 cm, now 2.2 x 3.1 cm without rebleed.   Electronically Signed   By: Elon Alas   On: 10/05/2013 04:31   Ct Head Wo Contrast  10/03/2013   CLINICAL DATA:  Follow-up evaluation.  EXAM: CT HEAD WITHOUT CONTRAST  TECHNIQUE: Contiguous axial images were obtained from the base of the skull through the vertex without intravenous contrast.  COMPARISON:  CT PORT HEAD WO CM dated 10/02/2013; CT HEAD W/O CM dated 10/01/2013  FINDINGS: Left temporal parietal 2.7 x 3 point cm a intraparenchymal hematoma was 2.8 x 4 cm. Similar surrounding low-density vasogenic edema with mild mass effect.  The ventricles and sulci are  overall normal for patient's age. Mild prominence of the cerebellar folia, somewhat advanced for age. Patchy supratentorial white matter hypodensities exclusive of the vasogenic edema suggests chronic small vessel ischemic disease.  No abnormal extra-axial fluid collections. Basal cisterns are patent. Dolichoectasia of the basilar artery may reflect chronic hypertension. Moderate calcific atherosclerosis of the intracranial vessels.  Chronic paranasal sinus mucosal thickening without air-fluid levels. Mastoid air cells appear well-aerated. Soft tissue within the external auditory canals likely reflects cerumen. Suspected proptosis, recommend clinical correlation.  IMPRESSION: Contracting left temporal parietal intraparenchymal hematoma, no rebleed.  Moderate white matter changes suggest chronic small vessel ischemic disease.   Electronically Signed   By: Elon Alas   On: 10/03/2013 05:18   Ct Head Wo  Contrast  10/01/2013   CLINICAL DATA:  Aphasic  EXAM: CT HEAD WITHOUT CONTRAST  TECHNIQUE: Contiguous axial images were obtained from the base of the skull through the vertex without intravenous contrast.  COMPARISON:  None.  FINDINGS: Skull and Sinuses:There is patchy mucosal thickening in the bilateral paranasal sinuses, worse in the ethmoid and frontal sinuses.  Orbits: Bilateral cataract resection.  Brain: 4 x 2.8 cm parenchymal hematoma in the left temporoparietal cortex and subcortical white matter. There is a tiny calcification or separate hemorrhage in the more deep white matter. There is surrounding edema with local mass effect; no midline shift or herniation. In this location, hemorrhagic infarct or amyloid angiopathy are the most likely causes.  Age advanced chronic small vessel ischemic injury with diffuse cerebral white matter low-attenuation. No hydrocephalus. Generalized cerebral volume loss.  Critical Value/emergent results were called by telephone at the time of interpretation on 10/01/2013 at  3:54 PM to Dr. Ezequiel Essex , who verbally acknowledged these results.  IMPRESSION: 1. 4 x 3 cm left temporal/parietal parenchymal hematoma. 2. 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 3. Diffuse inflammatory paranasal sinus disease.   Electronically Signed   By: Jorje Guild M.D.   On: 10/01/2013 15:59   Ct Angio Chest Pe W/cm &/or Wo Cm  10/04/2013   CLINICAL DATA:  Abnormal echocardiogram. Question pulmonary embolus.  EXAM: CT ANGIOGRAPHY CHEST WITH CONTRAST  TECHNIQUE: Multidetector CT imaging of the chest was performed using the standard protocol during bolus administration of intravenous contrast. Multiplanar CT image reconstructions and MIPs were obtained to evaluate the vascular anatomy.  CONTRAST:  145mL OMNIPAQUE IOHEXOL 350 MG/ML SOLN  COMPARISON:  One-view chest x-ray 10/04/2013.  FINDINGS: Pulmonary retro opacification is suboptimal. There is a significant filling defect within the right interlobar artery suggesting pulmonary embolus. There is some filling defect suspected in the right upper lobe as well on the of this area is degraded by patient breathing motion.  Scattered areas of airspace consolidation are noted. There is focal airspace disease in the posterior and lateral left upper lobe. Dependent airspace disease is present bilaterally. Small bilateral pleural effusions are noted.  The heart is enlarged. Atherosclerotic calcifications are present in the coronary artery in the descending aorta.  The bone windows with demonstrate no focal lytic or blastic lesions. An NG tube is in place. The patient is intubated.  Limited imaging in the upper abdomen is unremarkable.  Review of the MIP images confirms the above findings.  IMPRESSION: 1. Right lower lobar pulmonary embolus. 2. Left upper lobe on bilateral lower lobe airspace disease. While this likely represents atelectasis, early infection is not excluded. 3. Mild cardiomegaly and atherosclerotic disease. 4. Support  apparatus as described. Critical Value/emergent results were called by telephone at the time of interpretation on 10/04/2013 at 1:06 PM to Dr. Doree Fudge , who verbally acknowledged these results.   Electronically Signed   By: Lawrence Santiago M.D.   On: 10/04/2013 13:09   Dg Chest Port 1 View  10/07/2013   CLINICAL DATA:  Hemorrhagic cerebral infarct. Acute respiratory failure.  EXAM: PORTABLE CHEST - 1 VIEW  COMPARISON:  10/06/2013  FINDINGS: Patient is rotated to the right. Low lung volumes again noted. There is persistent opacity seen in the right lung base, which could represent combination of atelectasis, infiltrate, or effusion. Left lung remains grossly clear. The heart size is stable allowing for positioning.  IMPRESSION: Suboptimal exam due to positioning. Persistent low lung volumes and right lower lung  opacity.   Electronically Signed   By: Earle Gell M.D.   On: 10/07/2013 07:27   Dg Chest Port 1 View  10/06/2013   CLINICAL DATA:  Acute respiratory failure. Pulmonary embolism. Pleural effusions.  EXAM: PORTABLE CHEST - 1 VIEW  COMPARISON:  Chest x-ray dated 10/05/2013 and chest CT dated 10/04/2013  FINDINGS: Endotracheal tube, central venous catheter, and NG tube appear in good position. Pacemaker in place.  There is increased effusion and atelectasis at the right base. Tiny fusion and minimal atelectasis at the left base. Pulmonary vascularity is normal. Chronic cardiomegaly.  IMPRESSION: Increased right base atelectasis and effusion.   Electronically Signed   By: Rozetta Nunnery M.D.   On: 10/06/2013 08:51   Dg Chest Port 1 View  10/05/2013   CLINICAL DATA:  Intubation.  EXAM: PORTABLE CHEST - 1 VIEW  COMPARISON:  Chest radiograph October 04, 2013  FINDINGS: Endotracheal tube tip projects 3.3 cm above the equina. Left internal jugular central venous catheter with distal tip projecting in proximal superior vena cava. Nasogastric tube past the gastroesophageal junction though, distal  tip is not imaged. Dual lead right cardiac pacemaker in situ.  Stable at least moderate cardiomegaly, and mediastinal silhouette is nonsuspicious, patient is rotated to the right. Right lung base volume loss, the cardiac silhouette is mildly shift of the right concerning for right middle lobe collapse. Mild patchy bibasilar airspace opacities with improved aeration of left lung base. Small pleural effusions. No pneumothorax.  Large body habitus. Osseous structures are nonsuspicious. No apparent change in position of life support lines.  IMPRESSION: Stable cardiomegaly, bibasilar airspace opacities with improved aeration of left lung base. Probable right middle lobe collapse. Small residual pleural effusions.   Electronically Signed   By: Elon Alas   On: 10/05/2013 06:15   Portable Chest Xray In Am  10/04/2013   CLINICAL DATA:  Cerebral infarction. Nontraumatic intracranial hemorrhage. Acute respiratory failure.  EXAM: PORTABLE CHEST - 1 VIEW  COMPARISON:  10/03/2013  FINDINGS: Improved aeration noted in the right lung base. Persistent left retrocardiac atelectasis or consolidation demonstrated. Cardiomegaly is stable. Diffuse interstitial prominence again noted and mild interstitial edema cannot be excluded. No pneumothorax identified.  IMPRESSION: Decreased right basilar atelectasis. Otherwise no significant change.   Electronically Signed   By: Earle Gell M.D.   On: 10/04/2013 07:20   Dg Chest Portable 1 View  10/03/2013   CLINICAL DATA:  Intubation.  EXAM: PORTABLE CHEST - 1 VIEW  COMPARISON:  Chest 10/03/2013 at 5:32 a.m.  FINDINGS: Endotracheal tube is in place with tip in good position 3.9 cm above the carina. Left IJ catheter is again seen with the tip projecting over the mid superior vena cava. Right effusion basilar airspace disease persist. There is increased left basilar atelectasis. There is cardiomegaly. No pneumothorax.  IMPRESSION: ET tube in good position.  No change in right effusion  and basilar airspace disease. Left basilar atelectasis is noted.   Electronically Signed   By: Inge Rise M.D.   On: 10/03/2013 20:22   Portable Chest Xray In Am  10/03/2013   CLINICAL DATA:  Hypoxia  EXAM: PORTABLE CHEST - 1 VIEW  COMPARISON:  October 02, 2013  FINDINGS: Endotracheal tube tip is 3.9 cm above the carina. Nasogastric tube tip and side port are in the stomach. Left jugular catheter tip is in the superior vena cava. No pneumothorax.  There is a right pleural effusion with right base atelectasis. Lungs are otherwise clear. Heart is  mildly enlarged with normal pulmonary vascularity. Pacemaker leads are attached to the right atrium and right ventricle.  IMPRESSION: Fairly small right pleural effusion with right base atelectasis. Lungs are otherwise clear. Tube and catheter positions as described without pneumothorax.   Electronically Signed   By: Lowella Grip M.D.   On: 10/03/2013 07:05   Dg Chest Port 1 View  10/02/2013   CLINICAL DATA:  Intubation.  Central line placement.  EXAM: PORTABLE CHEST - 1 VIEW  COMPARISON:  10/01/2013  FINDINGS: Endotracheal tube is 4 cm above the carina. Left internal jugular central line is in place with the tip directed laterally in the upper SVC. No pneumothorax.  There is cardiomegaly with vascular congestion and bibasilar opacities, likely a combination of effusions and atelectasis. Right pacer remains in place, unchanged.  IMPRESSION: Endotracheal tube 4 cm above the carina. Left central line tip projected laterally towards the lateral wall of the upper SVC. No pneumothorax.  Cardiomegaly with vascular congestion.  Small effusions and bibasilar atelectasis.   Electronically Signed   By: Rolm Baptise M.D.   On: 10/02/2013 04:08   Chest Port 1 View  10/01/2013   CLINICAL DATA:  Cough, history of coronary artery disease and hypertension.  EXAM: PORTABLE CHEST - 1 VIEW  COMPARISON:  DG CHEST 1V PORT dated 09/05/2013  FINDINGS: The lungs are adequately  inflated. There is no focal infiltrate. The cardiac silhouette remains enlarged. The pulmonary vascularity is more prominent today than previously. The permanent pacemaker appears unchanged. There is tortuosity of the descending thoracic aorta.  IMPRESSION: The appearance of the pulmonary interstitium suggests mild interstitial edema likely secondary to CHF. There is no evidence of pneumonia or pleural effusion.   Electronically Signed   By: David  Martinique   On: 10/01/2013 20:04   Dg Abd Portable 1v  10/04/2013   CLINICAL DATA:  Status post gastric catheter placement  EXAM: PORTABLE ABDOMEN - 1 VIEW  COMPARISON:  10/04/2013 603 hrs  FINDINGS: A nasogastric catheter is again noted within the stomach. It is directed towards of pyloric channel. Chronic changes in the left base are again seen.  IMPRESSION: Gastric catheter within the distal stomach.  These results were called by telephone at the time of interpretation on 10/04/2013 at 10:35 AM to Margaretha Sheffield, the patient's nurse who verbally acknowledged these results.   Electronically Signed   By: Inez Catalina M.D.   On: 10/04/2013 10:35   Dg Abd Portable 1v  10/04/2013   CLINICAL DATA:  Orogastric tube placement.  EXAM: PORTABLE ABDOMEN - 1 VIEW  COMPARISON:  DG ABD PORTABLE 1V dated 10/02/2013  FINDINGS: Orogastric tube tip noted below the left hemidiaphragm in the upper portion of the left upper quadrant. Basilar atelectasis. Cardiomegaly. No free air.  IMPRESSION: 1. An orogastric tube noted with its tip below the left hemidiaphragm.  2.  Basilar atelectasis.  3. Cardiomegaly .   Electronically Signed   By: Marcello Moores  Register   On: 10/04/2013 07:29   Dg Abd Portable 1v  10/02/2013   CLINICAL DATA:  Orogastric tube placement  EXAM: PORTABLE ABDOMEN - 1 VIEW  COMPARISON:  None.  FINDINGS: Orogastric tube tip and side port are in the stomach. Bowel gas pattern is overall unremarkable. No free air seen on this supine examination.  IMPRESSION: Orogastric tube tip and  side port in stomach.   Electronically Signed   By: Lowella Grip M.D.   On: 10/02/2013 10:03   Ct Portable Head W/o Cm  10/02/2013  CLINICAL DATA:  Altered mental status. Follow-up intracranial hemorrhage.  EXAM: CT HEAD WITHOUT CONTRAST  TECHNIQUE: Contiguous axial images were obtained from the base of the skull through the vertex without intravenous contrast.  COMPARISON:  CT of the head October 01, 2013  FINDINGS: Skull base/posterior fossa is not imaged on today's study.  Similar appearance of left temporoparietal 2.8 x 4 cm (previously 2.8 x 4.1 cm) intraparenchymal hematoma with surrounding low-density vasogenic edema. Local mass effect without midline shift. No new hemorrhage. No hydrocephalus.  Patchy to confluent supratentorial white matter hypodensities exclusive of the vasogenic edema suggest chronic small vessel ischemic disease. No acute large vascular territory infarct.  No abnormal extra-axial fluid collections. Pan paranasal sinusitis. Status post bilateral ocular lens implants, with apparent proptosis, recommend clinical correlation.  IMPRESSION: Limited portable CT: Skullbase and posterior fossa not been included.  Evolving left temporoparietal intraparenchymal hematoma with similar mass effect. No new hemorrhage.   Electronically Signed   By: Elon Alas   On: 10/02/2013 06:36    ASSESSMENT AND PLAN 1. Stroke 2. Pulmonary embolism 3. intermittant CHB with appropriate PPM function.  4. VDRF Rec: Note plans for repeat CT. Discussed with Dr. Titus Mould. He may be able to be anti-coagulated at some point as per Dr. Titus Mould. If this is the case, his prognosis would be substantially improved. No need to change pacemaker programming at this point. Will plan TEE tomorrow to evaluate for PFO/RA/RV clot.  Gregg Taylor,M.D.  10/07/2013 11:00 AM

## 2013-10-07 NOTE — Progress Notes (Signed)
Pt passed SBT but is currently weaning on CP/PS 5/10 due to no plans to extubate today per MD. Pt is tolerating well with no complications noted. RT will monitor.

## 2013-10-07 NOTE — Progress Notes (Signed)
Stroke Team Progress Note  HISTORY [name] is an 68 y.o. male who lives with a roommate. Roommate felt that patient was not at his baseline today and this morning felt hat he was hallucinating. He went to his day program where they noticed that he was not talking right. EMS was called at that time. Last known well 09/30/2013 at 22:30. Patient was not administerd TPA secondary to delay in arrival. He was admitted to the neuro ICU for further evaluation and treatment.  SUBJECTIVE No family is at bedside. The patient remains sedated, on a ventilator.  OBJECTIVE Most recent Vital Signs: Filed Vitals:   10/07/13 0700 10/07/13 0715 10/07/13 0721 10/07/13 0800  BP: 159/88 157/94 164/99 143/82  Pulse: 72 69 79 73  Temp:    97.8 F (36.6 C)  TempSrc:    Oral  Resp: 16 16 14 28   Height:      Weight:      SpO2: 99% 99% 98% 99%   CBG (last 3)   Recent Labs  10/06/13 1945 10/07/13 0022 10/07/13 0330  GLUCAP 149* 137* 148*    IV Fluid Intake:   . sodium chloride 20 mL/hr at 10/06/13 2000  . sodium chloride    . sodium chloride    . sodium chloride    . propofol 25 mcg/kg/min (10/07/13 0800)    MEDICATIONS  . amiodarone  200 mg Oral Daily  . antiseptic oral rinse  15 mL Mouth Rinse QID  . atorvastatin  10 mg Oral q1800  . brimonidine  1 drop Both Eyes TID  . carvedilol  25 mg Per Tube BID WC  . chlorhexidine  15 mL Mouth Rinse BID  . cloNIDine  0.1 mg Per Tube BID  . dorzolamide-timolol  1 drop Both Eyes BID  . enoxaparin (LOVENOX) injection  40 mg Subcutaneous Q24H  . feeding supplement (PRO-STAT SUGAR FREE 64)  60 mL Per Tube QID  . feeding supplement (VITAL HIGH PROTEIN)  1,000 mL Per Tube Q24H  . insulin aspart  0-15 Units Subcutaneous 6 times per day  . isosorbide dinitrate  10 mg Oral BID  . magnesium oxide  400 mg Oral Daily  . multivitamin  5 mL Oral Daily  . pantoprazole (PROTONIX) IV  40 mg Intravenous QHS  . piperacillin-tazobactam (ZOSYN)  IV  3.375 g  Intravenous Q8H  . pneumococcal 23 valent vaccine  0.5 mL Intramuscular Tomorrow-1000  . polyethylene glycol  17 g Oral Daily  . polyvinyl alcohol  1 drop Both Eyes BID  . senna-docusate  1 tablet Oral BID  . vancomycin  1,000 mg Intravenous Q12H   PRN:  acetaminophen, acetaminophen, fentaNYL, hydrALAZINE, labetalol  Diet:  NPO Getting TF Activity:  As tolerated DVT Prophylaxis:  SCDs   CLINICALLY SIGNIFICANT STUDIES Basic Metabolic Panel:   Recent Labs Lab 10/07/13 0145 10/07/13 0500  NA 139 139  K 3.4* 3.3*  CL 103 104  CO2 26 25  GLUCOSE 158* 176*  BUN 26* 24*  CREATININE 1.01 0.95  CALCIUM 8.8 8.8  MG 2.2  --    Liver Function Tests:   Recent Labs Lab 10/01/13 1504 10/04/13 0758  AST 51* 18  ALT 77* 30  ALKPHOS 143* 110  BILITOT 0.2* 0.7  PROT 7.3 6.9  ALBUMIN 2.6* 2.4*   CBC:   Recent Labs Lab 10/01/13 1504  10/06/13 0325 10/07/13 0500  WBC 8.0  < > 8.8 7.4  NEUTROABS 4.8  --   --  4.0  HGB 11.2*  < > 10.9* 10.8*  HCT 34.8*  < > 34.4* 33.9*  MCV 80.9  < > 81.5 81.5  PLT 220  < > 168 183  < > = values in this interval not displayed. Coagulation:   Recent Labs Lab 10/01/13 1504  LABPROT 14.0  INR 1.10   Cardiac Enzymes:   Recent Labs Lab 10/01/13 1504  TROPONINI <0.30   Urinalysis:   Recent Labs Lab 10/01/13 1915  COLORURINE YELLOW  LABSPEC 1.013  PHURINE 7.5  GLUCOSEU NEGATIVE  HGBUR NEGATIVE  BILIRUBINUR NEGATIVE  KETONESUR NEGATIVE  PROTEINUR NEGATIVE  UROBILINOGEN 0.2  NITRITE NEGATIVE  LEUKOCYTESUR NEGATIVE   Lipid Panel    Component Value Date/Time   CHOL 134 10/05/2013 0500   TRIG 152* 10/05/2013 0500   TRIG 149 10/05/2013 0500   HDL 37* 10/05/2013 0500   CHOLHDL 3.6 10/05/2013 0500   VLDL 30 10/05/2013 0500   LDLCALC 67 10/05/2013 0500   HgbA1C  Lab Results  Component Value Date   HGBA1C 8.2* 10/02/2013    Urine Drug Screen:     Component Value Date/Time   LABOPIA NONE DETECTED 10/01/2013 1915   COCAINSCRNUR  NONE DETECTED 10/01/2013 1915   COCAINSCRNUR NEG 12/16/2008 2246   LABBENZ NONE DETECTED 10/01/2013 1915   LABBENZ NEG 12/16/2008 2246   AMPHETMU NONE DETECTED 10/01/2013 1915   AMPHETMU NEG 12/16/2008 2246   THCU NONE DETECTED 10/01/2013 1915   LABBARB NONE DETECTED 10/01/2013 1915    Alcohol Level:   Recent Labs Lab 10/01/13 1504  ETH <11   CT of the brain   10/05/2013 Contracting left temporoparietal intraparenchymal hematoma was 2.7 x 3.8 cm, now 2.2 x 3.1 cm without rebleed. 10/03/2013 Contracting left temporal parietal intraparenchymal hematoma, no rebleed. Moderate white matter changes suggest chronic small vessel ischemic disease. 10/02/2013   Limited portable CT: Skullbase and posterior fossa not been included.  Evolving left temporoparietal intraparenchymal hematoma with similar mass effect. No new hemorrhage.   10/01/2013    1. 4 x 3 cm left temporal/parietal parenchymal hematoma. 2. 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 3. Diffuse inflammatory paranasal sinus disease.     MRI/MRA head pacemaker  2D Echo There is a mass present in the right atrium and right ventricle. It is not seen in the IVC. It was not present on the study of May, 2014. Considering all factors, the most likely etiology is clot. The mass is seen in the RA. There is also mass that moves across the tricuspid valve. There is probably also mass in the RV cavity. I can not be sure, but all of these targets might be connected.  Carotid Doppler  CT angio chest 1. Right lower lobar pulmonary embolus.  2. Left upper lobe on bilateral lower lobe airspace disease. While  this likely represents atelectasis, early infection is not excluded.  3. Mild cardiomegaly and atherosclerotic disease.  4. Support apparatus as described.  CXR   10/05/2013 Stable cardiomegaly, bibasilar airspace opacities with improved  aeration of left lung base. Probable right middle lobe collapse. Small residual pleural effusions.   10/04/2013 Decreased right basilar atelectasis. Otherwise no significant  change. 10/03/2013 Fairly small right pleural effusion with right base atelectasis. Lungs are otherwise clear. Tube and catheter positions as described without pneumothorax. 10/02/2013    Endotracheal tube 4 cm above the carina. Left central line tip projected laterally towards the lateral wall of the upper SVC. No pneumothorax.  Cardiomegaly with vascular congestion.  Small effusions  and bibasilar atelectasis.    10/01/2013   The appearance of the pulmonary interstitium suggests mild interstitial edema likely secondary to CHF. There is no evidence of pneumonia or pleural effusion.     EKG  Sinus or ectopic atrial rhythm. Prolonged PR interval. Anterior infarct, old. No significant change was found.    Therapy Recommendations   Physical Exam  General: The patient is obese. He is intubated.  Cardiovascular: Distant heart sounds, regular rate.  Abdomen: Obese, nontender, bowel sounds present but diminished.  Skin: No significant peripheral edema is noted. 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   Neurologic Exam  Mental status: The patient is intubated and sedated.  Cranial nerves: Facial symmetry is present. The patient is intubated, sedated. Eyes are deviated to the left, no blink to threat. Some facial grimace is noted with pain stimulation. The patient will open his eyes with stimulation.  Motor: The patient has withdrawal movements on all 4 extremities.  Sensory examination: The patient will respond to deep pain stimulation with withdrawal on all 4 extremities.  Coordination: Cerebellar testing cannot be performed.  Gait and station: The patient is on bedrest, the patient could not be ambulated.  Reflexes: Deep tendon reflexes are symmetric, but are depressed.   ASSESSMENT [name] is a 68 y.o. male presenting with altered speech. Imaging confirms a left temporal parietal IPH with significant  surrounding edema - no hydrocephalus, no [name]. Suspect underlying mass or ischemic infarct given amt of edema. Unable to get MRI to confirm due to pacer. Hemmorrhage stable. Has developed respirator insufficiency due to PE from RA clot. Not an anticoagulation candidate for treatment due to [place].  On aspirin 81 mg orally every day prior to admission. Patient purposeful, localizes; global aphasia, right hemirparesis, dysphagia.  Right atrial clot, not a anticoagulation candidate due to hemorrhage RLL pulmonary embolus, not a anticoagulation candidate due to hemorrhage Respiratory failure, intubated  due to respiratory insufficiency, low sats after admission. Extubated/reintubated 2/18 am due to pulmonary edema vs aspiration pneumonia. Started on vanc and zosyn. Respiratory problems now known to be due to PE from RA clot. atrial fibrillation, not a coumadin candidate secondary to hemorrhage. Not on prior to admission due to hx rectal bleeding. hypertension Hyperlipidemia, on crestor 40 mg daily PTA, now on no statin as NPO Diabetes, uncontrolled, HgbA1c 8.2, goal 7.0 obstructive sleep apnea Obesity, Body mass index is 38.41 kg/(m^2).   Pacemaker  Legally blind  Herpes  LDL [facility] day # 6  No real change in clinical condition today. Note from cardiology suggests poor overall prognosis. The patient will be considered for anticoagulant therapy after CT scan on Monday.  TREATMENT/PLAN  Continue ICU level care  CCM following for ventilator management  Cardiology following TEE for Monday  Repeat CT Monday  Likely SNF at time of discharge  Veinous doppler studies have been done, negative for DVT  Ryan, Fletcher [id number] 10/07/2013 8:19 AM

## 2013-10-07 NOTE — Progress Notes (Signed)
[place] Progress Note Patient Name: [name] DOB: 31-May-1946 MRN: [id number]  Date of Service  10/07/2013   HPI/Events of Note  Call from nurse concerned re: pacer spikes/rhythm.  Patient is adequately sedated and hypertensive.  Review of monitor shows pacer spikes with QRS but change in conduction pathway and occ double pacer spikes and random spikes concerning for intermittent failure to sense of capture.   eICU Interventions  Plan: Check electrolytes 12 lead with rhythm strip Contacted cards fellow to evaluate   Intervention Category Intermediate Interventions: Arrhythmia - evaluation and management  DETERDING,ELIZABETH 10/07/2013, 12:51 AM

## 2013-10-07 NOTE — Progress Notes (Signed)
PULMONARY / CRITICAL CARE MEDICINE  Name: [name] MRN: [id number] DOB: 1945-08-19    ADMISSION DATE:  10/01/2013 CONSULTATION DATE:  10/02/2013  REFERRING MD :  Stroke PRIMARY SERVICE: Stroke  CHIEF COMPLAINT:  Acute respiratory failure   BRIEF PATIENT DESCRIPTION: 68 year old male with multiple co-morbids including suspected OSA/OHS. Admitted for left temporal / parietal ICH on 2/16. Developed decrease LOC and progressive respiratory failure early in the am on 2/17. PCCM asked to see emergently in consult.   SIGNIFICANT EVENTS / STUDIES:  2/16  Head CT >>> 1. 4 x 3 cm left temporal/parietal parenchymal hematoma. 2. 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 3. Diffuse inflammatory paranasal sinus disease. 2/17  Head CT >>>  Evolving left temporoparietal intraparenchymal hematoma with similar mass effect. No new hemorrhage. 2/18  Head CT >>>  Contracting left temporal parietal intraparenchymal hematoma, no rebleed. Moderate white matter changes suggest chronic small vessel ischemic disease. 2/18  Extubated / re-intubated due to pulmonary edema vs aspiration pneumonia vs other 2/19  TTE >>> mobile mass in RA on the pacer wire, likely clot (vs vegetation) 2/19  CT-PA >> R LL acute PE, LUL and BLL atx vs infiltrates  LINES / TUBES: L IJ CVL 2/17 >>> OETT 2/17 >>> 2/18; 2/18 >>> OGT 1/17 >>> 2/18; 2/18 >>> Foley 2/17 >>>  CULTURES: Blood 2/18 >> ngtd  resp 2/19 >> oropharyngeal flora  ANTIBIOTICS: Zosyn 2/18 >>>2/22 Vancomycin 2/18 >>>2/22 ceftriaxone 2/22>>>  SUBJECTIVE:   Remains intubated, follows some commands  VITAL SIGNS: Temp:  [97.4 F (36.3 C)-98.3 F (36.8 C)] 97.8 F (36.6 C) (02/22 0800) Pulse Rate:  [52-129] 73 (02/22 0800) Resp:  [14-28] 28 (02/22 0800) BP: (107-199)/(69-111) 143/82 mmHg (02/22 0800) SpO2:  [98 %-100 %] 99 % (02/22 0800) FiO2 (%):  [30 %-40 %] 30 % (02/22 0800) Weight:  [128.5 kg (283 lb 4.7 oz)] 128.5 kg (283 lb 4.7  oz) (02/22 0119)  HEMODYNAMICS:   VENTILATOR SETTINGS: Vent Mode:  [-] CPAP;PSV FiO2 (%):  [30 %-40 %] 30 % Set Rate:  [16 bmp] 16 bmp Vt Set:  [670 mL] 670 mL PEEP:  [5 cmH20] 5 cmH20 Pressure Support:  [10 cmH20] 10 cmH20 Plateau Pressure:  [21 cmH20-22 cmH20] 21 cmH20  INTAKE / OUTPUT: Intake/Output     02/21 0701 - 02/22 0700 02/22 0701 - 02/23 0700   I.V. (mL/kg) 1154 (9) 47 (0.4)   NG/GT 673.7 30   IV Piggyback 550    Total Intake(mL/kg) 2377.7 (18.5) 77 (0.6)   Urine (mL/kg/hr) 1150 (0.4)    Total Output 1150     Net +1227.7 +77         PHYSICAL EXAMINATION: General:  Mechanically ventilated, synchronous Neuro:  Sedated, gag / cough diminished HEENT:  OETT / OGT intact, PERRL Cardiovascular:  Regular, no m/g/r noted Lungs:  Bilateral air entry, few rhonchi Abdomen:  Obese, soft, nontender, nondistended +bs Musculoskeletal:  Trace edema Skin:  Intact   LABS:  CBC  Recent Labs Lab 10/05/13 0500 10/06/13 0325 10/07/13 0500  WBC 11.4* 8.8 7.4  HGB 10.6* 10.9* 10.8*  HCT 33.4* 34.4* 33.9*  PLT 143* 168 183   Coag's  Recent Labs Lab 10/01/13 1504  APTT 32  INR 1.10   BMET  Recent Labs Lab 10/06/13 0325 10/07/13 0145 10/07/13 0500  NA 142 139 139  K 3.8 3.4* 3.3*  CL 105 103 104  CO2 25 26 25   BUN 25* 26* 24*  CREATININE 1.14  1.01 0.95  GLUCOSE 141* 158* 176*   Electrolytes  Recent Labs Lab 10/06/13 0325 10/07/13 0145 10/07/13 0500  CALCIUM 8.8 8.8 8.8  MG  --  2.2  --    Sepsis Markers  Recent Labs Lab 10/04/13 0758 10/05/13 0500 10/06/13 0325  PROCALCITON 0.28 0.19 0.12    ABG  Recent Labs Lab 10/02/13 0001 10/02/13 0209 10/02/13 0353  PHART 7.403 7.197* 7.313*  PCO2ART 33.9* 74.2* 49.3*  PO2ART 144.0* 74.5* 90.5   Liver Enzymes  Recent Labs Lab 10/01/13 1504 10/04/13 0758  AST 51* 18  ALT 77* 30  ALKPHOS 143* 110  BILITOT 0.2* 0.7  ALBUMIN 2.6* 2.4*   Cardiac Enzymes  Recent Labs Lab  10/01/13 1504  TROPONINI <0.30   Glucose  Recent Labs Lab 10/06/13 0908 10/06/13 1133 10/06/13 1548 10/06/13 1945 10/07/13 0022 10/07/13 0330  GLUCAP 154* 156* 174* 149* 137* 148*   Imaging Dg Chest Port 1 View  10/07/2013   CLINICAL DATA:  Hemorrhagic cerebral infarct. Acute respiratory failure.  EXAM: PORTABLE CHEST - 1 VIEW  COMPARISON:  10/06/2013  FINDINGS: Patient is rotated to the right. Low lung volumes again noted. There is persistent opacity seen in the right lung base, which could represent combination of atelectasis, infiltrate, or effusion. Left lung remains grossly clear. The heart size is stable allowing for positioning.  IMPRESSION: Suboptimal exam due to positioning. Persistent low lung volumes and right lower lung opacity.   Electronically Signed   By: Earle Gell M.D.   On: 10/07/2013 07:27   Dg Chest Port 1 View  10/06/2013   CLINICAL DATA:  Acute respiratory failure. Pulmonary embolism. Pleural effusions.  EXAM: PORTABLE CHEST - 1 VIEW  COMPARISON:  Chest x-ray dated 10/05/2013 and chest CT dated 10/04/2013  FINDINGS: Endotracheal tube, central venous catheter, and NG tube appear in good position. Pacemaker in place.  There is increased effusion and atelectasis at the right base. Tiny fusion and minimal atelectasis at the left base. Pulmonary vascularity is normal. Chronic cardiomegaly.  IMPRESSION: Increased right base atelectasis and effusion.   Electronically Signed   By: Rozetta Nunnery M.D.   On: 10/06/2013 08:51   ASSESSMENT / PLAN:  PULMONARY A:  Acute respiratory failure, re intubated 2/18 suspect due to PE Possible PNA vs atelectasis Acute RLL PE Suspected OSA/OHS P:   Continuous mechanical support, wean as tolerated, consider extubation after TEE on 2/23 VAP bundle Daily SBT cpap 5 pa 5, goal 2 hr, requires PS 10 Anticoagulation contraindicated at this time Wait and watch off anticoag, repeat head CT scan monday. Consider starting heparin gtt at that  time. Clearly there are risks/benefits on each side.  Consider IVC filter- will get LE doppler to assess for clot source.  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 P:  Goal SBP <160 Continue Amiodarone, Lipitor, Coreg, Clonidine, Imdur - Goal bp 170-180s Empiric abx for possibility of endocarditis/ RA vegetation Not a candicate for thrombectomy when he cannot be heparinized or direct t-PA Hold ASA Labetalol PRN  RENAL A:   CKD stage 3 Hypokalemia Continue pos balance P:   Trend BMP lasix Replete electrolytes as indicated  GASTROINTESTINAL A:   Obesity Mild elevated LFTs, resolved GERD on PPI preadmission Nutrition P:   Maintain TF per Nutritionist Protonix  HEMATOLOGIC A:   Mild anemia  dilution  P:  Trend CBC, hgb stable lasix  INFECTIOUS A:   Possible pneumonia vs atx, Pct is equivocal. Concern for Endocarditis- await BC results. P:   Empiric Abx for concern veg, remains culture neg, dc vanc zosyn, add ceftriaxone for now Not needing abx for prolonged pNA  ENDOCRINE A:   DM  P:   SSI  NEUROLOGIC A: Left temp/parietal ICH w associated edema Acute encephalopathy P:   Appreciate stroke team assistance Goal RASS 0 to -1 Propofol gtt Fentanyl PRN consider repeat imaging  Melissa A Holmes PA-S  10/07/2013, 8:24 AM  Ccm time 30 min   Lavon Paganini. Titus Mould, MD, [place] Pgr: [facility]

## 2013-10-08 ENCOUNTER — Inpatient Hospital Stay (HOSPITAL_COMMUNITY): Payer: Medicare Other

## 2013-10-08 DIAGNOSIS — I513 Intracardiac thrombosis, not elsewhere classified: Secondary | ICD-10-CM

## 2013-10-08 DIAGNOSIS — I2699 Other pulmonary embolism without acute cor pulmonale: Secondary | ICD-10-CM

## 2013-10-08 DIAGNOSIS — I369 Nonrheumatic tricuspid valve disorder, unspecified: Secondary | ICD-10-CM

## 2013-10-08 LAB — GLUCOSE, CAPILLARY
GLUCOSE-CAPILLARY: 146 mg/dL — AB (ref 70–99)
GLUCOSE-CAPILLARY: 118 mg/dL — AB (ref 70–99)
Glucose-Capillary: 166 mg/dL — ABNORMAL HIGH (ref 70–99)
Glucose-Capillary: 156 mg/dL — ABNORMAL HIGH (ref 70–99)
Glucose-Capillary: 134 mg/dL — ABNORMAL HIGH (ref 70–99)

## 2013-10-08 LAB — CBC
HEMATOCRIT: 35.5 % — AB (ref 39.0–52.0)
Hemoglobin: 11 g/dL — ABNORMAL LOW (ref 13.0–17.0)
MCH: 25.4 pg — ABNORMAL LOW (ref 26.0–34.0)
MCHC: 31 g/dL (ref 30.0–36.0)
MCV: 82 fL (ref 78.0–100.0)
PLATELETS: 180 10*3/uL (ref 150–400)
RBC: 4.33 MIL/uL (ref 4.22–5.81)
RDW: 15.6 % — AB (ref 11.5–15.5)
WBC: 8.3 10*3/uL (ref 4.0–10.5)

## 2013-10-08 LAB — BASIC METABOLIC PANEL
BUN: 33 mg/dL — AB (ref 6–23)
CALCIUM: 9.1 mg/dL (ref 8.4–10.5)
CHLORIDE: 107 meq/L (ref 96–112)
CO2: 27 mEq/L (ref 19–32)
CREATININE: 1.13 mg/dL (ref 0.50–1.35)
GFR calc Af Amer: 77 mL/min — ABNORMAL LOW (ref >90)
GFR calc non Af Amer: 66 mL/min — ABNORMAL LOW (ref >90)
Glucose, Bld: 185 mg/dL — ABNORMAL HIGH (ref 70–99)
Potassium: 3.3 mEq/L — ABNORMAL LOW (ref 3.7–5.3)
Sodium: 146 mEq/L (ref 137–147)

## 2013-10-08 LAB — TRIGLYCERIDES: Triglycerides: 160 mg/dL — ABNORMAL HIGH (ref <150)

## 2013-10-08 NOTE — Progress Notes (Signed)
PULMONARY / CRITICAL CARE MEDICINE  Name: Ashtyn Frasier MRN: [id number] DOB: [birth year]   LOS 7  days   ADMISSION DATE:  10/01/2013 CONSULTATION DATE:  10/02/2013  REFERRING MD :  Stroke PRIMARY SERVICE: Stroke  CHIEF COMPLAINT:  Acute respiratory failure   BRIEF PATIENT DESCRIPTION: 68 year old male with multiple co-morbids including suspected OSA/OHS. Admitted for left temporal / parietal ICH on 2/16. Developed decrease LOC and progressive respiratory failure early in the am on 2/17. PCCM asked to see emergently in consult.   SIGNIFICANT EVENTS / STUDIES:  2/16  Head CT >>> 1. 4 x 3 cm left temporal/parietal parenchymal hematoma. 2. 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 3. Diffuse inflammatory paranasal sinus disease. 2/17  Head CT >>>  Evolving left temporoparietal intraparenchymal hematoma with similar mass effect. No new hemorrhage. 2/18  Head CT >>>  Contracting left temporal parietal intraparenchymal hematoma, no rebleed. Moderate white matter changes suggest chronic small vessel ischemic disease. 2/18  Extubated / re-intubated due to pulmonary edema vs aspiration pneumonia vs other 2/19  TTE >>> mobile mass in RA on the pacer wire, likely clot (vs vegetation) 2/19  CT-PA >> R LL acute PE, LUL and BLL atx vs infiltrates  LINES / TUBES: L IJ CVL 2/17 >>> OETT 2/17 >>> 2/18; 2/18 >>> OGT 1/17 >>> 2/18; 2/18 >>> Foley 2/17 >>>  CULTURES: Blood 2/18 >> ngtd  resp 2/19 >> oropharyngeal flora  ANTIBIOTICS: Zosyn 2/18 >>>2/22 Vancomycin 2/18 >>>2/22 ceftriaxone 2/22>>>  SUBJECTIVE:     10/08/2013 : increase size of Left temp/parietal hemorrhage to 3.5cm without midline shift. Neuro advises holding off anticoagulation for one more week at the least   VITAL SIGNS: Temp:  [97.6 F (36.4 C)-98.8 F (37.1 C)] 97.6 F (36.4 C) (02/23 0747) Pulse Rate:  [56-88] 72 (02/23 0800) Resp:  [0-27] 17 (02/23 0900) BP: (88-207)/(54-107) 120/76 mmHg (02/23  0800) SpO2:  [88 %-100 %] 100 % (02/23 0800) FiO2 (%):  [30 %-40 %] 30 % (02/23 0800) Weight:  [127.2 kg (280 lb 6.8 oz)] 127.2 kg (280 lb 6.8 oz) (02/23 0500)  HEMODYNAMICS:   VENTILATOR SETTINGS: Vent Mode:  [-] PRVC FiO2 (%):  [30 %-40 %] 30 % Set Rate:  [16 bmp] 16 bmp Vt Set:  [670 mL] 670 mL PEEP:  [5 cmH20] 5 cmH20 Pressure Support:  [10 cmH20] 10 cmH20 Plateau Pressure:  [18 cmH20-20 cmH20] 20 cmH20  INTAKE / OUTPUT: Intake/Output     02/22 0701 - 02/23 0700 02/23 0701 - 02/24 0700   I.V. (mL/kg) 1013.2 (8) 55.4 (0.4)   NG/GT 480    IV Piggyback 50    Total Intake(mL/kg) 1543.2 (12.1) 55.4 (0.4)   Urine (mL/kg/hr) 3645 (1.2) 275 (1)   Total Output 3645 275   Net -2101.8 -219.6        Stool Occurrence 5 x     PHYSICAL EXAMINATION: General:  Mechanically ventilated, synchronous Neuro:  Sedated, gag / cough diminished HEENT:  OETT / OGT intact, PERRL Cardiovascular:  Regular, no m/g/r noted Lungs:  Bilateral air entry, few rhonchi Abdomen:  Obese, soft, nontender, nondistended +bs Musculoskeletal:  Trace edema Skin:  Intact   LABS:  PULMONARY  Recent Labs Lab 10/01/13 1519 10/02/13 0001 10/02/13 0209 10/02/13 0353  PHART  --  7.403 7.197* 7.313*  PCO2ART  --  33.9* 74.2* 49.3*  PO2ART  --  144.0* 74.5* 90.5  HCO3  --  20.7 28.1* 24.3*  TCO2 24 21.7 30.4 25.8  O2SAT  --  99.1 90.3 95.7    CBC  Recent Labs Lab 10/06/13 0325 10/07/13 0500 10/08/13 0500  HGB 10.9* 10.8* 11.0*  HCT 34.4* 33.9* 35.5*  WBC 8.8 7.4 8.3  PLT 168 183 180    COAGULATION  Recent Labs Lab 10/01/13 1504  INR 1.10    CARDIAC   Recent Labs Lab 10/01/13 1504  TROPONINI <0.30   No results found for this basename: PROBNP,  in the last 168 hours   CHEMISTRY  Recent Labs Lab 10/05/13 0500 10/06/13 0325 10/07/13 0145 10/07/13 0500 10/08/13 0500  NA 142 142 139 139 146  K 3.3* 3.8 3.4* 3.3* 3.3*  CL 104 105 103 104 107  CO2 24 25 26 25 27   GLUCOSE  130* 141* 158* 176* 185*  BUN 19 25* 26* 24* 33*  CREATININE 1.18 1.14 1.01 0.95 1.13  CALCIUM 8.6 8.8 8.8 8.8 9.1  MG  --   --  2.2  --   --    Estimated Creatinine Clearance: 89.8 ml/min (by C-G formula based on Cr of 1.13).   LIVER  Recent Labs Lab 10/01/13 1504 10/04/13 0758  AST 51* 18  ALT 77* 30  ALKPHOS 143* 110  BILITOT 0.2* 0.7  PROT 7.3 6.9  ALBUMIN 2.6* 2.4*  INR 1.10  --      INFECTIOUS  Recent Labs Lab 10/04/13 0758 10/05/13 0500 10/06/13 0325  PROCALCITON 0.28 0.19 0.12     ENDOCRINE CBG (last 3)   Recent Labs  10/07/13 2352 10/08/13 0407 10/08/13 0745  GLUCAP 146* 166* 156*         IMAGING x48h  Ct Head Wo Contrast  10/08/2013   CLINICAL DATA:  Follow-up stroke.  EXAM: CT HEAD WITHOUT CONTRAST  TECHNIQUE: Contiguous axial images were obtained from the base of the skull through the vertex without intravenous contrast.  COMPARISON:  CT HEAD W/O CM dated 10/05/2013; CT HEAD W/O CM dated 10/03/2013  FINDINGS: Left temporoparietal intraparenchymal hematoma was 2.2 x 3.1 cm, now 2.9 x 3.5 cm, concerning for rebleed. Surrounding low-density vasogenic edema with sulcal effacement. No midline shift.  The ventricles and sulci are overall normal for patient's age. Mild disproportionate prominence of the cerebellar folia again noted. Patchy supratentorial white matter hypodensities exclusive of the vasogenic edema suggest chronic small vessel ischemic disease. No acute large vascular territory infarct.  No abnormal extra-axial fluid collections. Basal cisterns are patent. Dolichoectasia basilar artery suggest chronic hypertension with moderate calcific atherosclerosis of the carotid siphons.  Chronic paranasal sinusitis. Mastoid air cells demonstrate minimal soft tissue within the left mastoid tip. Suspected proptosis, recommend direct inspection. Soft tissue within the external auditory canals likely reflects cerumen.  IMPRESSION: Interval increase in size of  left temporoparietal intraparenchymal hematoma, was 2.2 x 3.1 cm, now 2.9 x 3.5 cm concerning for rebleed/ propagation of hemorrhagic stroke. Local mass effect without midline shift. No acute large vascular territory. These results will be called to the ordering clinician or representative by the Radiologist Assistant, and communication documented in the PACS Dashboard.   Electronically Signed   By: Elon Alas   On: 10/08/2013 06:52   Dg Chest Port 1 View  10/07/2013   CLINICAL DATA:  Atelectasis, history hypertension, diabetes, cardiomyopathy, coronary disease  EXAM: PORTABLE CHEST - 1 VIEW  COMPARISON:  Portable exam 1751 hr compared to 10/07/2013 at 0458 hr.  FINDINGS: Tip of endotracheal tube projects 3.8 cm above carinal.  Nasogastric tube extends into stomach.  Left jugular  central venous catheter with tip projecting over SVC.  Right subclavian pacemaker leads project over right atrium and right ventricle.  Enlargement of cardiac silhouette with pulmonary vascular congestion.  Prominent mediastinum likely related to technique.  Low lung volumes with bibasilar atelectasis.  Improved aeration at right base versus previous exam.  No pleural effusion or pneumothorax.  IMPRESSION: Enlargement of cardiac silhouette.  Mild bibasilar atelectasis with improved aeration at right base versus previous study.   Electronically Signed   By: Lavonia Dana M.D.   On: 10/07/2013 18:47   Dg Chest Port 1 View  10/07/2013   CLINICAL DATA:  Hemorrhagic cerebral infarct. Acute respiratory failure.  EXAM: PORTABLE CHEST - 1 VIEW  COMPARISON:  10/06/2013  FINDINGS: Patient is rotated to the right. Low lung volumes again noted. There is persistent opacity seen in the right lung base, which could represent combination of atelectasis, infiltrate, or effusion. Left lung remains grossly clear. The heart size is stable allowing for positioning.  IMPRESSION: Suboptimal exam due to positioning. Persistent low lung volumes and right  lower lung opacity.   Electronically Signed   By: Earle Gell M.D.   On: 10/07/2013 07:27      ASSESSMENT / PLAN:  PULMONARY A:  Acute respiratory failure, re intubated 2/18 suspect due to PE Possible PNA vs atelectasis Acute RLL PE Suspected OSA/OHS  - Unable to meet extubation criteria  P:   Continuous mechanical support, wean as tolerated, consider extubation after TEE on 2/23 VAP bundle Daily SBT cpap 5 pa 5, goal 2 hr, requires PS 10 Anticoagulation contraindicated at this time; Consider starting heparin gtt reassess 10/14/13 Clearly there are risks/benefits on each side.  Consider IVC filter- will get LE doppler to assess for clot source. 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  P:  Goal SBP <160 Continue Amiodarone, Lipitor, Coreg, Clonidine, Imdur - Goal bp 170-180s Empiric abx for possibility of endocarditis/ RA vegetation Not a candicate for thrombectomy when he cannot be heparinized or direct t-PA Hold ASA Labetalol PRN  RENAL A:   CKD stage 3 Hypokalemia Continue pos balance P:   Trend BMP lasix Replete electrolytes as indicated  GASTROINTESTINAL A:   Obesity Mild elevated LFTs, resolved GERD on PPI preadmission Nutrition P:   Maintain TF per Nutritionist Protonix  HEMATOLOGIC A:   Mild anemia  dilution P:  Trend CBC, hgb stable lasix  INFECTIOUS A:   Possible pneumonia vs atx, Pct is equivocal. Concern for Endocarditis- await BC results. P:   Empiric Abx for concern veg, remains culture neg, dc vanc zosyn, add ceftriaxone for now Not needing abx for prolonged pNA  ENDOCRINE A:   DM  P:   SSI  NEUROLOGIC A: Left temp/parietal ICH w associated edema Acute encephalopathy -  worsening hemorrhage    P:   Appreciate stroke team assistance Goal RASS 0 to -1 Propofol gtt Fentanyl PRN No anticoagulation  GLOBAL 10/08/13: daughter updated. i d/w Dr Leonie Man  The patient is critically ill with multiple organ systems failure and requires high complexity decision making for assessment and support, frequent evaluation and titration of therapies, application of advanced monitoring technologies and extensive interpretation of multiple databases.   Critical Care Time devoted to patient care services described in this note is  65  Minutes.  Dr. Brand Males, M.D., [facility].C.P Pulmonary and Critical Care Medicine Staff Physician [place] [facility] Pager: [phone], If no answer or between  15:00h - 7:00h: call [phone]  10/08/2013 9:27 AM

## 2013-10-08 NOTE — CV Procedure (Signed)
Procedure: TEE  Indication: RA mass  Sedation: Patient was on a Propofol gtt (intubated) and was given an additional 25 mcg Fentanyl IV.   Findings:  Please see echo section for full report.  Normal LV size with moderate LV hypertrophy.  EF 55-60%. Normal RV size and systolic function.  Mild TR.  There was a pacemaker in the RV.  There was a mobile, large vegetation versus clot attached to the pacemaker lead in the RA.  The mobile vegetation prolapses across the tricuspid valve.  The tricuspid valve does not appear to be involved by the vegetation.  There was no LAA thrombus.  No PFO/ASD (negative bubble study).   Impression: Mobile mass (vegetation versus thrombus) attached to the pacemaker lead in the RA, prolapsing into the RV.  The tricuspid valve does not appear involved.   [name] 10/08/2013 1:30 PM

## 2013-10-08 NOTE — Progress Notes (Signed)
  Echocardiogram Echocardiogram Transesophageal has been performed.  Philipp Deputy 10/08/2013, 3:34 PM

## 2013-10-08 NOTE — H&P (View-Only) (Signed)
PULMONARY / CRITICAL CARE MEDICINE  Name: [name] MRN: [id number] DOB: 12-12-1945   LOS 7  days   ADMISSION DATE:  10/01/2013 CONSULTATION DATE:  10/02/2013  REFERRING MD :  Stroke PRIMARY SERVICE: Stroke  CHIEF COMPLAINT:  Acute respiratory failure   BRIEF PATIENT DESCRIPTION: 68 year old male with multiple co-morbids including suspected OSA/OHS. Admitted for left temporal / parietal ICH on 2/16. Developed decrease LOC and progressive respiratory failure early in the am on 2/17. PCCM asked to see emergently in consult.   SIGNIFICANT EVENTS / STUDIES:  2/16  Head CT >>> 1. 4 x 3 cm left temporal/parietal parenchymal hematoma. 2. 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 3. Diffuse inflammatory paranasal sinus disease. 2/17  Head CT >>>  Evolving left temporoparietal intraparenchymal hematoma with similar mass effect. No new hemorrhage. 2/18  Head CT >>>  Contracting left temporal parietal intraparenchymal hematoma, no rebleed. Moderate white matter changes suggest chronic small vessel ischemic disease. 2/18  Extubated / re-intubated due to pulmonary edema vs aspiration pneumonia vs other 2/19  TTE >>> mobile mass in RA on the pacer wire, likely clot (vs vegetation) 2/19  CT-PA >> R LL acute PE, LUL and BLL atx vs infiltrates  LINES / TUBES: L IJ CVL 2/17 >>> OETT 2/17 >>> 2/18; 2/18 >>> OGT 1/17 >>> 2/18; 2/18 >>> Foley 2/17 >>>  CULTURES: Blood 2/18 >> ngtd  resp 2/19 >> oropharyngeal flora  ANTIBIOTICS: Zosyn 2/18 >>>2/22 Vancomycin 2/18 >>>2/22 ceftriaxone 2/22>>>  SUBJECTIVE:     10/08/2013 : increase size of Left temp/parietal hemorrhage to 3.5cm without midline shift. Neuro advises holding off anticoagulation for one more week at the least   VITAL SIGNS: Temp:  [97.6 F (36.4 C)-98.8 F (37.1 C)] 97.6 F (36.4 C) (02/23 0747) Pulse Rate:  [56-88] 72 (02/23 0800) Resp:  [0-27] 17 (02/23 0900) BP: (88-207)/(54-107) 120/76 mmHg (02/23  0800) SpO2:  [88 %-100 %] 100 % (02/23 0800) FiO2 (%):  [30 %-40 %] 30 % (02/23 0800) Weight:  [127.2 kg (280 lb 6.8 oz)] 127.2 kg (280 lb 6.8 oz) (02/23 0500)  HEMODYNAMICS:   VENTILATOR SETTINGS: Vent Mode:  [-] PRVC FiO2 (%):  [30 %-40 %] 30 % Set Rate:  [16 bmp] 16 bmp Vt Set:  [670 mL] 670 mL PEEP:  [5 cmH20] 5 cmH20 Pressure Support:  [10 cmH20] 10 cmH20 Plateau Pressure:  [18 cmH20-20 cmH20] 20 cmH20  INTAKE / OUTPUT: Intake/Output     02/22 0701 - 02/23 0700 02/23 0701 - 02/24 0700   I.V. (mL/kg) 1013.2 (8) 55.4 (0.4)   NG/GT 480    IV Piggyback 50    Total Intake(mL/kg) 1543.2 (12.1) 55.4 (0.4)   Urine (mL/kg/hr) 3645 (1.2) 275 (1)   Total Output 3645 275   Net -2101.8 -219.6        Stool Occurrence 5 x     PHYSICAL EXAMINATION: General:  Mechanically ventilated, synchronous Neuro:  Sedated, gag / cough diminished HEENT:  OETT / OGT intact, PERRL Cardiovascular:  Regular, no m/g/r noted Lungs:  Bilateral air entry, few rhonchi Abdomen:  Obese, soft, nontender, nondistended +bs Musculoskeletal:  Trace edema Skin:  Intact   LABS:  PULMONARY  Recent Labs Lab 10/01/13 1519 10/02/13 0001 10/02/13 0209 10/02/13 0353  PHART  --  7.403 7.197* 7.313*  PCO2ART  --  33.9* 74.2* 49.3*  PO2ART  --  144.0* 74.5* 90.5  HCO3  --  20.7 28.1* 24.3*  TCO2 24 21.7 30.4 25.8  O2SAT  --  99.1 90.3 95.7    CBC  Recent Labs Lab 10/06/13 0325 10/07/13 0500 10/08/13 0500  HGB 10.9* 10.8* 11.0*  HCT 34.4* 33.9* 35.5*  WBC 8.8 7.4 8.3  PLT 168 183 180    COAGULATION  Recent Labs Lab 10/01/13 1504  INR 1.10    CARDIAC   Recent Labs Lab 10/01/13 1504  TROPONINI <0.30   No results found for this basename: PROBNP,  in the last 168 hours   CHEMISTRY  Recent Labs Lab 10/05/13 0500 10/06/13 0325 10/07/13 0145 10/07/13 0500 10/08/13 0500  NA 142 142 139 139 146  K 3.3* 3.8 3.4* 3.3* 3.3*  CL 104 105 103 104 107  CO2 24 25 26 25 27   GLUCOSE  130* 141* 158* 176* 185*  BUN 19 25* 26* 24* 33*  CREATININE 1.18 1.14 1.01 0.95 1.13  CALCIUM 8.6 8.8 8.8 8.8 9.1  MG  --   --  2.2  --   --    Estimated Creatinine Clearance: 89.8 ml/min (by C-G formula based on Cr of 1.13).   LIVER  Recent Labs Lab 10/01/13 1504 10/04/13 0758  AST 51* 18  ALT 77* 30  ALKPHOS 143* 110  BILITOT 0.2* 0.7  PROT 7.3 6.9  ALBUMIN 2.6* 2.4*  INR 1.10  --      INFECTIOUS  Recent Labs Lab 10/04/13 0758 10/05/13 0500 10/06/13 0325  PROCALCITON 0.28 0.19 0.12     ENDOCRINE CBG (last 3)   Recent Labs  10/07/13 2352 10/08/13 0407 10/08/13 0745  GLUCAP 146* 166* 156*         IMAGING x48h  Ct Head Wo Contrast  10/08/2013   CLINICAL DATA:  Follow-up stroke.  EXAM: CT HEAD WITHOUT CONTRAST  TECHNIQUE: Contiguous axial images were obtained from the base of the skull through the vertex without intravenous contrast.  COMPARISON:  CT HEAD W/O CM dated 10/05/2013; CT HEAD W/O CM dated 10/03/2013  FINDINGS: Left temporoparietal intraparenchymal hematoma was 2.2 x 3.1 cm, now 2.9 x 3.5 cm, concerning for rebleed. Surrounding low-density vasogenic edema with sulcal effacement. No midline shift.  The ventricles and sulci are overall normal for patient's age. Mild disproportionate prominence of the cerebellar folia again noted. Patchy supratentorial white matter hypodensities exclusive of the vasogenic edema suggest chronic small vessel ischemic disease. No acute large vascular territory infarct.  No abnormal extra-axial fluid collections. Basal cisterns are patent. Dolichoectasia basilar artery suggest chronic hypertension with moderate calcific atherosclerosis of the carotid siphons.  Chronic paranasal sinusitis. Mastoid air cells demonstrate minimal soft tissue within the left mastoid tip. Suspected proptosis, recommend direct inspection. Soft tissue within the external auditory canals likely reflects cerumen.  IMPRESSION: Interval increase in size of  left temporoparietal intraparenchymal hematoma, was 2.2 x 3.1 cm, now 2.9 x 3.5 cm concerning for rebleed/ propagation of hemorrhagic stroke. Local mass effect without midline shift. No acute large vascular territory. These results will be called to the ordering clinician or representative by the Radiologist Assistant, and communication documented in the PACS Dashboard.   Electronically Signed   By: Elon Alas   On: 10/08/2013 06:52   Dg Chest Port 1 View  10/07/2013   CLINICAL DATA:  Atelectasis, history hypertension, diabetes, cardiomyopathy, coronary disease  EXAM: PORTABLE CHEST - 1 VIEW  COMPARISON:  Portable exam 1751 hr compared to 10/07/2013 at 0458 hr.  FINDINGS: Tip of endotracheal tube projects 3.8 cm above carinal.  Nasogastric tube extends into stomach.  Left jugular  central venous catheter with tip projecting over SVC.  Right subclavian pacemaker leads project over right atrium and right ventricle.  Enlargement of cardiac silhouette with pulmonary vascular congestion.  Prominent mediastinum likely related to technique.  Low lung volumes with bibasilar atelectasis.  Improved aeration at right base versus previous exam.  No pleural effusion or pneumothorax.  IMPRESSION: Enlargement of cardiac silhouette.  Mild bibasilar atelectasis with improved aeration at right base versus previous study.   Electronically Signed   By: Lavonia Dana M.D.   On: 10/07/2013 18:47   Dg Chest Port 1 View  10/07/2013   CLINICAL DATA:  Hemorrhagic cerebral infarct. Acute respiratory failure.  EXAM: PORTABLE CHEST - 1 VIEW  COMPARISON:  10/06/2013  FINDINGS: Patient is rotated to the right. Low lung volumes again noted. There is persistent opacity seen in the right lung base, which could represent combination of atelectasis, infiltrate, or effusion. Left lung remains grossly clear. The heart size is stable allowing for positioning.  IMPRESSION: Suboptimal exam due to positioning. Persistent low lung volumes and right  lower lung opacity.   Electronically Signed   By: Earle Gell M.D.   On: 10/07/2013 07:27      ASSESSMENT / PLAN:  PULMONARY A:  Acute respiratory failure, re intubated 2/18 suspect due to PE Possible PNA vs atelectasis Acute RLL PE Suspected OSA/OHS  - Unable to meet extubation criteria  P:   Continuous mechanical support, wean as tolerated, consider extubation after TEE on 2/23 VAP bundle Daily SBT cpap 5 pa 5, goal 2 hr, requires PS 10 Anticoagulation contraindicated at this time; Consider starting heparin gtt reassess 10/14/13 Clearly there are risks/benefits on each side.  Consider IVC filter- will get LE doppler to assess for clot source. 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  P:  Goal SBP <160 Continue Amiodarone, Lipitor, Coreg, Clonidine, Imdur - Goal bp 170-180s Empiric abx for possibility of endocarditis/ RA vegetation Not a candicate for thrombectomy when he cannot be heparinized or direct t-PA Hold ASA Labetalol PRN  RENAL A:   CKD stage 3 Hypokalemia Continue pos balance P:   Trend BMP lasix Replete electrolytes as indicated  GASTROINTESTINAL A:   Obesity Mild elevated LFTs, resolved GERD on PPI preadmission Nutrition P:   Maintain TF per Nutritionist Protonix  HEMATOLOGIC A:   Mild anemia  dilution P:  Trend CBC, hgb stable lasix  INFECTIOUS A:   Possible pneumonia vs atx, Pct is equivocal. Concern for Endocarditis- await BC results. P:   Empiric Abx for concern veg, remains culture neg, dc vanc zosyn, add ceftriaxone for now Not needing abx for prolonged pNA  ENDOCRINE A:   DM  P:   SSI  NEUROLOGIC A: Left temp/parietal ICH w associated edema Acute encephalopathy -  worsening hemorrhage    P:   Appreciate stroke team assistance Goal RASS 0 to -1 Propofol gtt Fentanyl PRN No anticoagulation  GLOBAL 10/08/13: daughter updated. i d/w Dr Leonie Man  The patient is critically ill with multiple organ systems failure and requires high complexity decision making for assessment and support, frequent evaluation and titration of therapies, application of advanced monitoring technologies and extensive interpretation of multiple databases.   Critical Care Time devoted to patient care services described in this note is  24  Minutes.  Dr. Brand Males, M.D., [facility].C.P Pulmonary and Critical Care Medicine Staff Physician [place] [facility] Pager: [phone], If no answer or between  15:00h - 7:00h: call [phone]  10/08/2013 9:27 AM

## 2013-10-08 NOTE — Progress Notes (Signed)
VASCULAR LAB     Patient has IJ line in place and unable to visualize carotid.  Please reorder Carotid Doppler once IJ line is removed, thank you.     Martena Emanuele, RVT 10/08/2013, 8:17 AM

## 2013-10-08 NOTE — Interval H&P Note (Signed)
History and Physical Interval Note:  10/08/2013 1:01 PM  [name]  has presented today for surgery, with the diagnosis of * No surgery found *  The various methods of treatment have been discussed with the patient and family. After consideration of risks, benefits and other options for treatment, the patient has consented to  * No surgery found * as a surgical intervention .  The patient's history has been reviewed, patient examined, no change in status, stable for surgery.  I have reviewed the patient's chart and labs.  Questions were answered to the patient's satisfaction.     [name] [employer]

## 2013-10-08 NOTE — Progress Notes (Signed)
SUBJECTIVE:  Intubated and sedated.   PHYSICAL EXAM Filed Vitals:   10/08/13 1154 10/08/13 1200 10/08/13 1224 10/08/13 1300  BP:  123/76 118/72 124/78  Pulse:  69 65 74  Temp: 97.7 F (36.5 C)     TempSrc: Axillary     Resp:  13 17 16   Height:      Weight:      SpO2:  99% 99% 99%   General:   No acute distress Lungs:  Clear Heart:  RRR, paced Abdomen:  Decreased bowel sounds Extremities:   No edema   LABS:  Results for orders placed during the hospital encounter of 10/01/13 (from the past 24 hour(s))  GLUCOSE, CAPILLARY     Status: Abnormal   Collection Time    10/07/13  4:09 PM      Result Value Ref Range   Glucose-Capillary 147 (*) 70 - 99 mg/dL  GLUCOSE, CAPILLARY     Status: Abnormal   Collection Time    10/07/13  7:03 PM      Result Value Ref Range   Glucose-Capillary 134 (*) 70 - 99 mg/dL  GLUCOSE, CAPILLARY     Status: Abnormal   Collection Time    10/07/13 11:52 PM      Result Value Ref Range   Glucose-Capillary 146 (*) 70 - 99 mg/dL   Comment 1 Documented in Chart     Comment 2 Notify RN    GLUCOSE, CAPILLARY     Status: Abnormal   Collection Time    10/08/13  4:07 AM      Result Value Ref Range   Glucose-Capillary 166 (*) 70 - 99 mg/dL   Comment 1 Documented in Chart     Comment 2 Notify RN    BASIC METABOLIC PANEL     Status: Abnormal   Collection Time    10/08/13  5:00 AM      Result Value Ref Range   Sodium 146  137 - 147 mEq/L   Potassium 3.3 (*) 3.7 - 5.3 mEq/L   Chloride 107  96 - 112 mEq/L   CO2 27  19 - 32 mEq/L   Glucose, Bld 185 (*) 70 - 99 mg/dL   BUN 33 (*) 6 - 23 mg/dL   Creatinine, Ser 1.13  0.50 - 1.35 mg/dL   Calcium 9.1  8.4 - 10.5 mg/dL   GFR calc non Af Amer 66 (*) >90 mL/min   GFR calc Af Amer 77 (*) >90 mL/min  CBC     Status: Abnormal   Collection Time    10/08/13  5:00 AM      Result Value Ref Range   WBC 8.3  4.0 - 10.5 K/uL   RBC 4.33  4.22 - 5.81 MIL/uL   Hemoglobin 11.0 (*) 13.0 - 17.0 g/dL   HCT 35.5 (*)  39.0 - 52.0 %   MCV 82.0  78.0 - 100.0 fL   MCH 25.4 (*) 26.0 - 34.0 pg   MCHC 31.0  30.0 - 36.0 g/dL   RDW 15.6 (*) 11.5 - 15.5 %   Platelets 180  150 - 400 K/uL  TRIGLYCERIDES     Status: Abnormal   Collection Time    10/08/13  5:00 AM      Result Value Ref Range   Triglycerides 160 (*) <150 mg/dL  GLUCOSE, CAPILLARY     Status: Abnormal   Collection Time    10/08/13  7:45 AM      Result Value Ref  Range   Glucose-Capillary 156 (*) 70 - 99 mg/dL  GLUCOSE, CAPILLARY     Status: Abnormal   Collection Time    10/08/13 11:53 AM      Result Value Ref Range   Glucose-Capillary 146 (*) 70 - 99 mg/dL    Intake/Output Summary (Last 24 hours) at 10/08/13 1432 Last data filed at 10/08/13 1203  Gross per 24 hour  Intake 1250.73 ml  Output   3390 ml  Net -2139.27 ml    ASSESSMENT AND PLAN:  CVA (cerebral infarction)/Intracerebral hemorrhage:  No anticoagulation at this time.  However we will continue to discuss risk benefits with neurology over the next several days given the high risk without anticoagulation.    Acute respiratory failure/Aspiration pneumonia:  Question early trach per CCM.   Mural thrombus of heart:  TEE today confirms mass in the right side of the heart.  This is a large mass consistent with clot on the pacemaker lead and prolapsing.  No options for intervention at this point and he is not a anticoagulation candidate.  No DVT.  No indication for filter.    Pulmonary emboli:  See above.   Minus Breeding 10/08/2013 2:32 PM

## 2013-10-08 NOTE — Progress Notes (Addendum)
Stroke Team Progress Note  HISTORY [name] is an 68 y.o. male who lives with a roommate. Roommate felt that patient was not at his baseline today and this morning felt hat he was hallucinating. He went to his day program where they noticed that he was not talking right. EMS was called at that time. Last known well 09/30/2013 at 22:30. Patient was not administerd TPA secondary to delay in arrival. He was admitted to the neuro ICU for further evaluation and treatment.  SUBJECTIVE His daughter is at his bedside. Dr. Leonie Man discussed care plan with her.  OBJECTIVE Most recent Vital Signs: Filed Vitals:   10/08/13 0645 10/08/13 0729 10/08/13 0747 10/08/13 0800  BP: 142/83 116/70  120/76  Pulse: 67 60  72  Temp:   97.6 F (36.4 C)   TempSrc:   Axillary   Resp: 16 16  14   Height:      Weight:      SpO2: 100% 100%  100%   CBG (last 3)   Recent Labs  10/07/13 2352 10/08/13 0407 10/08/13 0745  GLUCAP 146* 166* 156*    IV Fluid Intake:   . sodium chloride 20 mL/hr at 10/06/13 2000  . sodium chloride 20 mL/hr at 10/08/13 0805  . propofol 20 mcg/kg/min (10/08/13 0805)    MEDICATIONS  . amiodarone  200 mg Oral Daily  . antiseptic oral rinse  15 mL Mouth Rinse QID  . atorvastatin  10 mg Oral q1800  . brimonidine  1 drop Both Eyes TID  . carvedilol  25 mg Per Tube BID WC  . cefTRIAXone (ROCEPHIN)  IV  1 g Intravenous Q24H  . chlorhexidine  15 mL Mouth Rinse BID  . cloNIDine  0.1 mg Per Tube BID  . dorzolamide-timolol  1 drop Both Eyes BID  . enoxaparin (LOVENOX) injection  40 mg Subcutaneous Q24H  . feeding supplement (PRO-STAT SUGAR FREE 64)  60 mL Per Tube QID  . feeding supplement (VITAL HIGH PROTEIN)  1,000 mL Per Tube Q24H  . furosemide  40 mg Intravenous Q12H  . insulin aspart  0-15 Units Subcutaneous 6 times per day  . isosorbide dinitrate  10 mg Oral BID  . magnesium oxide  400 mg Oral Daily  . multivitamin  5 mL Oral Daily  . pantoprazole (PROTONIX) IV  40 mg  Intravenous QHS  . pneumococcal 23 valent vaccine  0.5 mL Intramuscular Tomorrow-1000  . polyethylene glycol  17 g Oral Daily  . polyvinyl alcohol  1 drop Both Eyes BID  . potassium chloride  20 mEq Per Tube BID  . senna-docusate  1 tablet Oral BID   PRN:  acetaminophen, acetaminophen, fentaNYL, hydrALAZINE, labetalol  Diet:  NPO Getting TF Activity:  As tolerated DVT Prophylaxis:  Lovenox 40 mg sq daily   CLINICALLY SIGNIFICANT STUDIES Basic Metabolic Panel:   Recent Labs Lab 10/07/13 0145 10/07/13 0500 10/08/13 0500  NA 139 139 146  K 3.4* 3.3* 3.3*  CL 103 104 107  CO2 26 25 27   GLUCOSE 158* 176* 185*  BUN 26* 24* 33*  CREATININE 1.01 0.95 1.13  CALCIUM 8.8 8.8 9.1  MG 2.2  --   --    Liver Function Tests:   Recent Labs Lab 10/01/13 1504 10/04/13 0758  AST 51* 18  ALT 77* 30  ALKPHOS 143* 110  BILITOT 0.2* 0.7  PROT 7.3 6.9  ALBUMIN 2.6* 2.4*   CBC:   Recent Labs Lab 10/01/13 1504  10/07/13 0500 10/08/13 0500  WBC 8.0  < > 7.4 8.3  NEUTROABS 4.8  --  4.0  --   HGB 11.2*  < > 10.8* 11.0*  HCT 34.8*  < > 33.9* 35.5*  MCV 80.9  < > 81.5 82.0  PLT 220  < > 183 180  < > = values in this interval not displayed. Coagulation:   Recent Labs Lab 10/01/13 1504  LABPROT 14.0  INR 1.10   Cardiac Enzymes:   Recent Labs Lab 10/01/13 1504  TROPONINI <0.30   Urinalysis:   Recent Labs Lab 10/01/13 1915  COLORURINE YELLOW  LABSPEC 1.013  PHURINE 7.5  GLUCOSEU NEGATIVE  HGBUR NEGATIVE  BILIRUBINUR NEGATIVE  KETONESUR NEGATIVE  PROTEINUR NEGATIVE  UROBILINOGEN 0.2  NITRITE NEGATIVE  LEUKOCYTESUR NEGATIVE   Lipid Panel    Component Value Date/Time   CHOL 134 10/05/2013 0500   TRIG 160* 10/08/2013 0500   HDL 37* 10/05/2013 0500   CHOLHDL 3.6 10/05/2013 0500   VLDL 30 10/05/2013 0500   LDLCALC 67 10/05/2013 0500   HgbA1C  Lab Results  Component Value Date   HGBA1C 8.2* 10/02/2013    Urine Drug Screen:     Component Value Date/Time    LABOPIA NONE DETECTED 10/01/2013 1915   COCAINSCRNUR NONE DETECTED 10/01/2013 1915   COCAINSCRNUR NEG 12/16/2008 2246   LABBENZ NONE DETECTED 10/01/2013 1915   LABBENZ NEG 12/16/2008 2246   AMPHETMU NONE DETECTED 10/01/2013 1915   AMPHETMU NEG 12/16/2008 2246   THCU NONE DETECTED 10/01/2013 1915   LABBARB NONE DETECTED 10/01/2013 1915    Alcohol Level:   Recent Labs Lab 10/01/13 1504  ETH <11   CT of the brain   10/08/2013 Interval increase in size of left temporoparietal intraparenchymal hematoma, was 2.2 x 3.1 cm, now 2.9 x 3.5 cm concerning for rebleed/ propagation of hemorrhagic stroke. Local mass effect without midline shift. No acute large vascular territory.  10/05/2013 Contracting left temporoparietal intraparenchymal hematoma was 2.7 x 3.8 cm, now 2.2 x 3.1 cm without rebleed. 10/03/2013 Contracting left temporal parietal intraparenchymal hematoma, no rebleed. Moderate white matter changes suggest chronic small vessel ischemic disease. 10/02/2013   Limited portable CT: Skullbase and posterior fossa not been included.  Evolving left temporoparietal intraparenchymal hematoma with similar mass effect. No new hemorrhage.   10/01/2013    1. 4 x 3 cm left temporal/parietal parenchymal hematoma. 2. 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 3. Diffuse inflammatory paranasal sinus disease.     MRI/MRA head pacemaker  2D Echo There is a mass present in the right atrium and right ventricle. It is not seen in the IVC. It was not present on the study of May, 2014. Considering all factors, the most likely etiology is clot. The mass is seen in the RA. There is also mass that moves across the tricuspid valve. There is probably also mass in the RV cavity. I can not be sure, but all of these targets might be connected.  Carotid Doppler (on hold d/t IJ)  LE Venous Dopplers There is no evidence of DVT or SVT in the bilateral lower extremities.  CT angio chest 1. Right lower lobar pulmonary  embolus. 2. Left upper lobe on bilateral lower lobe airspace disease. While this likely represents atelectasis, early infection is not excluded. 3. Mild cardiomegaly and atherosclerotic disease. 4. Support apparatus as described.  CXR   10/07/2013 Enlargement of cardiac silhouette. Mild bibasilar atelectasis with improved aeration at right base versus previous study. 10/07/2013 Suboptimal exam due to positioning.  Persistent low lung volumes and right lower lung opacity. 10/06/2013 Increased right base atelectasis and effusion. 10/05/2013 Stable cardiomegaly, bibasilar airspace opacities with improved  aeration of left lung base. Probable right middle lobe collapse. Small residual pleural effusions.  10/04/2013 Decreased right basilar atelectasis. Otherwise no significant  change. 10/03/2013 Fairly small right pleural effusion with right base atelectasis. Lungs are otherwise clear. Tube and catheter positions as described without pneumothorax. 10/02/2013    Endotracheal tube 4 cm above the carina. Left central line tip projected laterally towards the lateral wall of the upper SVC. No pneumothorax.  Cardiomegaly with vascular congestion.  Small effusions and bibasilar atelectasis.    10/01/2013   The appearance of the pulmonary interstitium suggests mild interstitial edema likely secondary to CHF. There is no evidence of pneumonia or pleural effusion.     EKG  Sinus or ectopic atrial rhythm. Prolonged PR interval. Anterior infarct, old. No significant change was found.   Therapy Recommendations   Physical Exam General: The patient is obese. He is intubated. Cardiovascular: Distant heart sounds, regular rate. Abdomen: Obese, nontender, bowel sounds present but diminished. Skin: No significant peripheral edema is noted. 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  Neurologic Exam Mental status: The patient is intubated and sedated. Cranial nerves: Facial symmetry is present. The patient is intubated, sedated.  Eyes are deviated to the left, no blink to threat. Some facial grimace is noted with pain stimulation. The patient will open his eyes with stimulation. Motor: The patient has withdrawal movements on all 4 extremities. Sensory examination: The patient will respond to deep pain stimulation with withdrawal on all 4 extremities. Coordination: Cerebellar testing cannot be performed. Gait and station: The patient is on bedrest, the patient could not be ambulated. Reflexes: Deep tendon reflexes are symmetric, but are depressed.   ASSESSMENT [name] is a 68 y.o. male presenting with altered speech. Imaging confirms a left temporal parietal IPH with significant surrounding edema - no hydrocephalus, no [name]. Suspect underlying mass or ischemic infarct given amt of edema, possible vegetation. Unable to get MRI to confirm due to pacer. Slight increase in hemmorrhage size over night.  Has respiratory insufficiency due to PE from RA clot found last week. Not an anticoagulation candidate for treatment due to [place] and continued increase in size.  On aspirin 81 mg orally every day prior to admission. Patient purposeful, localizes; global aphasia, right hemirparesis, dysphagia.  Right atrial clot, not a anticoagulation candidate due to hemorrhage RLL pulmonary embolus, not a anticoagulation candidate due to hemorrhage Respiratory failure, intubated  due to respiratory insufficiency, low sats after admission. Extubated/reintubated 2/18 am due to pulmonary edema vs aspiration pneumonia. Started on vanc and zosyn. Also has PE from RA clot. Suspected OSA/OHS atrial fibrillation, not a coumadin candidate secondary to hemorrhage. Not on prior to admission due to hx rectal bleeding. hypertension Hyperlipidemia, on crestor 40 mg daily PTA, now on no statin as NPO Diabetes, uncontrolled, HgbA1c 8.2, goal 7.0 obstructive sleep apnea Obesity, Body mass index is 38.02 kg/(m^2).   Pacemaker. intermittant CHB with  appropriate PPM function.  Legally blind  Herpes  LDL 67  CAD  Non-ischemic cardiomyopathy  Chronic kidney disease, stage 3  Hypokalemia, K 3.3  Hospital day # 7  TREATMENT/PLAN  Continue ICU level care  CCM following for ventilator management  Planned TEE for today by cardiology  Reconsider carotid doppler once IJ is removed  Not a candidate for anticoagulation/antiplatelet due to increasing size of hemorrhage. Will reconsider in  1 week  Likely SNF at time of discharge This patient is critically ill and at significant risk of neurological worsening, death and care requires constant monitoring of vital signs, hemodynamics,respiratory and cardiac monitoring,review of multiple databases, neurological assessment, discussion with family, other specialists and medical decision making of high complexity. I spent 30 minutes of neurocritical care time  in the care of  this patient. Burnetta Sabin, MSN, RN, ANVP-BC, ANP-BC, [facility] Pager: [phone] 10/08/2013 8:47 AM  I have personally obtained a history, examined the patient, evaluated imaging results, and formulated the assessment and plan of care. I agree with the above. Antony Contras, MD

## 2013-10-09 ENCOUNTER — Inpatient Hospital Stay (HOSPITAL_COMMUNITY): Payer: Medicare Other

## 2013-10-09 DIAGNOSIS — I6350 Cerebral infarction due to unspecified occlusion or stenosis of unspecified cerebral artery: Secondary | ICD-10-CM

## 2013-10-09 DIAGNOSIS — I2699 Other pulmonary embolism without acute cor pulmonale: Secondary | ICD-10-CM

## 2013-10-09 LAB — CK TOTAL AND CKMB (NOT AT ARMC)
CK, MB: <1 ng/mL (ref 0.3–4.0)
Total CK: 45 U/L (ref 7–232)

## 2013-10-09 LAB — BASIC METABOLIC PANEL
BUN: 32 mg/dL — ABNORMAL HIGH (ref 6–23)
CALCIUM: 9.1 mg/dL (ref 8.4–10.5)
CO2: 26 meq/L (ref 19–32)
Chloride: 103 mEq/L (ref 96–112)
Creatinine, Ser: 1.09 mg/dL (ref 0.50–1.35)
GFR calc Af Amer: 80 mL/min — ABNORMAL LOW (ref >90)
GFR calc non Af Amer: 69 mL/min — ABNORMAL LOW (ref >90)
Glucose, Bld: 180 mg/dL — ABNORMAL HIGH (ref 70–99)
Potassium: 3.3 mEq/L — ABNORMAL LOW (ref 3.7–5.3)
SODIUM: 141 meq/L (ref 137–147)

## 2013-10-09 LAB — PHOSPHORUS: Phosphorus: 2.9 mg/dL (ref 2.3–4.6)

## 2013-10-09 LAB — GLUCOSE, CAPILLARY
GLUCOSE-CAPILLARY: 174 mg/dL — AB (ref 70–99)
Glucose-Capillary: 145 mg/dL — ABNORMAL HIGH (ref 70–99)
Glucose-Capillary: 151 mg/dL — ABNORMAL HIGH (ref 70–99)
Glucose-Capillary: 151 mg/dL — ABNORMAL HIGH (ref 70–99)
Glucose-Capillary: 167 mg/dL — ABNORMAL HIGH (ref 70–99)
Glucose-Capillary: 193 mg/dL — ABNORMAL HIGH (ref 70–99)

## 2013-10-09 LAB — PRO B NATRIURETIC PEPTIDE: Pro B Natriuretic peptide (BNP): 378.9 pg/mL — ABNORMAL HIGH (ref 0–125)

## 2013-10-09 LAB — MAGNESIUM: MAGNESIUM: 2.1 mg/dL (ref 1.5–2.5)

## 2013-10-09 MED ORDER — PRO-STAT SUGAR FREE PO LIQD
30.0000 mL | Freq: Two times a day (BID) | ORAL | Status: DC
  Administered 2013-10-09 – 2013-10-16 (×11): 30 mL
  Filled 2013-10-09 (×15): qty 30

## 2013-10-09 MED ORDER — VITAL HIGH PROTEIN PO LIQD
1000.0000 mL | ORAL | Status: DC
  Administered 2013-10-09 – 2013-10-16 (×7): 1000 mL
  Filled 2013-10-09 (×13): qty 1000

## 2013-10-09 MED ORDER — PANTOPRAZOLE SODIUM 40 MG PO PACK
40.0000 mg | PACK | Freq: Every day | ORAL | Status: DC
  Administered 2013-10-09 – 2013-10-15 (×7): 40 mg
  Filled 2013-10-09 (×10): qty 20

## 2013-10-09 NOTE — Progress Notes (Signed)
PULMONARY / CRITICAL CARE MEDICINE  Name: [name] MRN: [id number] DOB: 1946/08/03   LOS 8  days   ADMISSION DATE:  10/01/2013 CONSULTATION DATE:  10/02/2013  REFERRING MD :  Stroke PRIMARY SERVICE: Stroke  CHIEF COMPLAINT:  Acute respiratory failure   BRIEF PATIENT DESCRIPTION: 68 year old male with multiple co-morbids including suspected OSA/OHS. Admitted for left temporal / parietal ICH on 2/16. Developed decrease LOC and progressive respiratory failure early in the am on 2/17. PCCM asked to see emergently in consult.   SIGNIFICANT EVENTS / STUDIES:  2/16  Head CT >>> 1. 4 x 3 cm left temporal/parietal parenchymal hematoma. 2. 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 3. Diffuse inflammatory paranasal sinus disease. 2/17  Head CT >>>  Evolving left temporoparietal intraparenchymal hematoma with similar mass effect. No new hemorrhage. 2/18  Head CT >>>  Contracting left temporal parietal intraparenchymal hematoma, no rebleed. Moderate white matter changes suggest chronic small vessel ischemic disease. 2/18  Extubated / re-intubated due to pulmonary edema vs aspiration pneumonia vs other 2/19  TTE >>> mobile mass in RA on the pacer wire, likely clot (vs vegetation) 2/19  CT-PA >> R LL acute PE, LUL and BLL atx vs infiltrates 10/08/13 - NEURO: increase size of Left temp/parietal hemorrhage to 3.5cm without midline shift. Neuro advises holding off anticoagulation for one more week at the least  10/08/13: CARDS: Mural thrombus of heart: TEE confirms mass in the right side of the heart. This is a large mass consistent with clot on the pacemaker lead and prolapsing. No options for intervention at this point and he is not a anticoagulation candidate. No DVT. No indication for filter.  LINES / TUBES: L IJ CVL 2/17 >>> OETT 2/17 >>> 2/18; 2/18 >>> OGT 1/17 >>> 2/18; 2/18 >>> Foley 2/17 >>>  CULTURES: MRSA PCR 2/16 - negative Blood 2/18 >> ngtd  resp 2/19 >>  oropharyngeal flora     ANTIBIOTICS: Zosyn 2/18 >>>2/22 Vancomycin 2/18 >>>2/22 ceftriaxone 2/22>>> (2/26)  SUBJECTIVE:     10/09/13: Neuro and cards  Issues noted. Doing SBT Not following commands   VITAL SIGNS: Temp:  [97.7 F (36.5 C)-98.6 F (37 C)] 98.6 F (37 C) (02/24 0801) Pulse Rate:  [52-86] 70 (02/24 0800) Resp:  [13-24] 19 (02/24 0800) BP: (59-154)/(46-118) 149/72 mmHg (02/24 0800) SpO2:  [94 %-100 %] 99 % (02/24 0800) FiO2 (%):  [30 %] 30 % (02/24 0800) Weight:  [124.9 kg (275 lb 5.7 oz)] 124.9 kg (275 lb 5.7 oz) (02/24 0406)  HEMODYNAMICS:   VENTILATOR SETTINGS: Vent Mode:  [-] CPAP;PSV FiO2 (%):  [30 %] 30 % Set Rate:  [16 bmp] 16 bmp Vt Set:  [670 mL] 670 mL PEEP:  [5 cmH20] 5 cmH20 Pressure Support:  [5 cmH20] 5 cmH20 Plateau Pressure:  [17 cmH20-18 cmH20] 17 cmH20  INTAKE / OUTPUT: Intake/Output     02/23 0701 - 02/24 0700 02/24 0701 - 02/25 0700   I.V. (mL/kg) 856.4 (6.9) 35.4 (0.3)   NG/GT 450 60   IV Piggyback 50    Total Intake(mL/kg) 1356.4 (10.9) 95.4 (0.8)   Urine (mL/kg/hr) 2080 (0.7) 125 (0.4)   Total Output 2080 125   Net -723.6 -29.6         PHYSICAL EXAMINATION: General:  Mechanically ventilated, synchronous Neuro:  Sedated, gag / cough diminished HEENT:  OETT / OGT intact, PERRL Cardiovascular:  Regular, no m/g/r noted Lungs:  Bilateral air entry, few rhonchi Abdomen:  Obese, soft, nontender, nondistended +  bs Musculoskeletal:  Trace edema Skin:  Intact   LABS:  PULMONARY No results found for this basename: PHART, PCO2, PCO2ART, PO2, PO2ART, HCO3, TCO2, O2SAT,  in the last 168 hours  CBC  Recent Labs Lab 10/06/13 0325 10/07/13 0500 10/08/13 0500  HGB 10.9* 10.8* 11.0*  HCT 34.4* 33.9* 35.5*  WBC 8.8 7.4 8.3  PLT 168 183 180    COAGULATION No results found for this basename: INR,  in the last 168 hours  CARDIAC  No results found for this basename: TROPONINI,  in the last 168 hours  Recent Labs Lab  10/09/13 0410  PROBNP 378.9*     CHEMISTRY  Recent Labs Lab 10/06/13 0325 10/07/13 0145 10/07/13 0500 10/08/13 0500 10/09/13 0410  NA 142 139 139 146 141  K 3.8 3.4* 3.3* 3.3* 3.3*  CL 105 103 104 107 103  CO2 25 26 25 27 26   GLUCOSE 141* 158* 176* 185* 180*  BUN 25* 26* 24* 33* 32*  CREATININE 1.14 1.01 0.95 1.13 1.09  CALCIUM 8.8 8.8 8.8 9.1 9.1  MG  --  2.2  --   --  2.1  PHOS  --   --   --   --  2.9   Estimated Creatinine Clearance: 92.2 ml/min (by C-G formula based on Cr of 1.09).   LIVER  Recent Labs Lab 10/04/13 0758  AST 18  ALT 30  ALKPHOS 110  BILITOT 0.7  PROT 6.9  ALBUMIN 2.4*     INFECTIOUS  Recent Labs Lab 10/04/13 0758 10/05/13 0500 10/06/13 0325  PROCALCITON 0.28 0.19 0.12     ENDOCRINE CBG (last 3)   Recent Labs  10/09/13 0011 10/09/13 0337 10/09/13 0800  GLUCAP 193* 167* 145*         IMAGING x48h  Ct Head Wo Contrast  10/08/2013   CLINICAL DATA:  Follow-up stroke.  EXAM: CT HEAD WITHOUT CONTRAST  TECHNIQUE: Contiguous axial images were obtained from the base of the skull through the vertex without intravenous contrast.  COMPARISON:  CT HEAD W/O CM dated 10/05/2013; CT HEAD W/O CM dated 10/03/2013  FINDINGS: Left temporoparietal intraparenchymal hematoma was 2.2 x 3.1 cm, now 2.9 x 3.5 cm, concerning for rebleed. Surrounding low-density vasogenic edema with sulcal effacement. No midline shift.  The ventricles and sulci are overall normal for patient's age. Mild disproportionate prominence of the cerebellar folia again noted. Patchy supratentorial white matter hypodensities exclusive of the vasogenic edema suggest chronic small vessel ischemic disease. No acute large vascular territory infarct.  No abnormal extra-axial fluid collections. Basal cisterns are patent. Dolichoectasia basilar artery suggest chronic hypertension with moderate calcific atherosclerosis of the carotid siphons.  Chronic paranasal sinusitis. Mastoid air cells  demonstrate minimal soft tissue within the left mastoid tip. Suspected proptosis, recommend direct inspection. Soft tissue within the external auditory canals likely reflects cerumen.  IMPRESSION: Interval increase in size of left temporoparietal intraparenchymal hematoma, was 2.2 x 3.1 cm, now 2.9 x 3.5 cm concerning for rebleed/ propagation of hemorrhagic stroke. Local mass effect without midline shift. No acute large vascular territory. These results will be called to the ordering clinician or representative by the Radiologist Assistant, and communication documented in the PACS Dashboard.   Electronically Signed   By: Elon Alas   On: 10/08/2013 06:52   Dg Chest Port 1 View  10/09/2013   CLINICAL DATA:  Stroke.  Acute respiratory failure.  EXAM: PORTABLE CHEST - 1 VIEW  COMPARISON:  Single view of the chest 10/07/2013 and  10/06/2013.  FINDINGS: The patient's left IJ catheter has backed out slightly with the tip now just in the superior vena cava. Support tubes and lines are otherwise unchanged. Bibasilar atelectasis is again seen. There is cardiomegaly but no edema.  IMPRESSION: Cardiomegaly without edema.  No change in bibasilar atelectasis.  Left IJ catheter has backed out slightly with the tip now just in the superior vena cava.   Electronically Signed   By: Inge Rise M.D.   On: 10/09/2013 08:13   Dg Chest Port 1 View  10/07/2013   CLINICAL DATA:  Atelectasis, history hypertension, diabetes, cardiomyopathy, coronary disease  EXAM: PORTABLE CHEST - 1 VIEW  COMPARISON:  Portable exam 1751 hr compared to 10/07/2013 at 0458 hr.  FINDINGS: Tip of endotracheal tube projects 3.8 cm above carinal.  Nasogastric tube extends into stomach.  Left jugular central venous catheter with tip projecting over SVC.  Right subclavian pacemaker leads project over right atrium and right ventricle.  Enlargement of cardiac silhouette with pulmonary vascular congestion.  Prominent mediastinum likely related to  technique.  Low lung volumes with bibasilar atelectasis.  Improved aeration at right base versus previous exam.  No pleural effusion or pneumothorax.  IMPRESSION: Enlargement of cardiac silhouette.  Mild bibasilar atelectasis with improved aeration at right base versus previous study.   Electronically Signed   By: Lavonia Dana M.D.   On: 10/07/2013 18:47   Dg Abd Portable 1v  10/08/2013   CLINICAL DATA:  OG tube placement.  EXAM: PORTABLE ABDOMEN - 1 VIEW  COMPARISON:  09/1913  FINDINGS: Orogastric tube has been placed, tip overlying the level of the mid to distal stomach. Bowel gas pattern is nonobstructive.  IMPRESSION: Interval repositioning of orogastric tube.   Electronically Signed   By: Shon Hale M.D.   On: 10/08/2013 17:49      ASSESSMENT / PLAN:  PULMONARY A:  Acute respiratory failure, re intubated 2/18 due to PE from Right atrial lead clot Possible PNA vs atelectasis Acute RLL PE Suspected OSA/OHS DUplex LE negative for DVT 10/08/13  - Unable to meet extubation criteria due to CNS issues  P:   Full Vent support; SBT as tolerated. No extubation. Need trach Anticoagulation contraindicated at this time; Consider starting heparin gtt reassess 10/14/13 Clearly there are risks/benefits on each side.  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- Looking less likely this is endocarditis P:  Per Cards Goal SBP <160 Empiric abx for possibility of endocarditis/ RA vegetation; need to establish stop date Not a candicate for thrombectomy when he cannot be heparinized or direct t-PA Hold ASA Labetalol PRN  RENAL A:   CKD stage 3 Hypokalemia Continued pos balance  P:   Trend BMP   GASTROINTESTINAL A:   Obesity Mild elevated LFTs, resolved GERD on PPI  preadmission Nutrition P:   Maintain TF per Nutritionist Protonix  HEMATOLOGIC A:   Mild anemia  dilution P:  Trend CBC, hgb stable PRBC for hgb < 7gm%  INFECTIOUS A:   Possible pneumonia vs atx, Pct is equivocal. TEE 10/08/13: Mural thrombus and  Not endocarditis   P:   End ceftriaxone on 10/11/13   ENDOCRINE A:   DM  P:   SSI  NEUROLOGIC A: Left temp/parietal ICH w associated edema Acute encephalopathy - worsening hemorrhage 10/08/13 imaging  of brain   P:   Appreciate stroke team assistance Goal RASS 0 to -1 Propofol gtt; check lactic acid and CK 10/10/13 Fentanyl PRN No anticoagulation atleast till 10/14/13 and then reassess  GLOBAL 10/08/13: daughter updated and informed of concept of trach. i d/w Dr Leonie Man 10/09/13: Needs trach/peg but no family at bedside   The patient is critically ill with multiple organ systems failure and requires high complexity decision making for assessment and support, frequent evaluation and titration of therapies, application of advanced monitoring technologies and extensive interpretation of multiple databases.   Critical Care Time devoted to patient care services described in this note is  35  Minutes.  Dr. Brand Males, M.D., [facility].C.P Pulmonary and Critical Care Medicine Staff Physician [facility] Pager: [phone], If no answer or between  15:00h - 7:00h: call [phone]  10/09/2013 9:42 AM

## 2013-10-09 NOTE — Progress Notes (Signed)
Stroke Team Progress Note  HISTORY [name] is an 68 y.o. male who lives with a roommate. Roommate felt that patient was not at his baseline today and this morning felt hat he was hallucinating. He went to his day program where they noticed that he was not talking right. EMS was called at that time. Last known well 09/30/2013 at 22:30. Patient was not administerd TPA secondary to delay in arrival. He was admitted to the neuro ICU for further evaluation and treatment.  SUBJECTIVE RN at bedside. No family present (snowing today).  OBJECTIVE Most recent Vital Signs: Filed Vitals:   10/09/13 0600 10/09/13 0630 10/09/13 0700 10/09/13 0748  BP: 131/88 111/61 126/65 154/118  Pulse: 70 73 70 70  Temp:      TempSrc:      Resp: 19 16 18 17   Height:      Weight:      SpO2: 98% 98% 99% 98%   CBG (last 3)   Recent Labs  10/08/13 2008 10/09/13 0011 10/09/13 0337  GLUCAP 118* 193* 167*    IV Fluid Intake:   . sodium chloride 20 mL/hr at 10/09/13 0600  . sodium chloride    . propofol 20 mcg/kg/min (10/09/13 0433)    MEDICATIONS  . amiodarone  200 mg Oral Daily  . antiseptic oral rinse  15 mL Mouth Rinse QID  . atorvastatin  10 mg Oral q1800  . brimonidine  1 drop Both Eyes TID  . carvedilol  25 mg Per Tube BID WC  . cefTRIAXone (ROCEPHIN)  IV  1 g Intravenous Q24H  . chlorhexidine  15 mL Mouth Rinse BID  . cloNIDine  0.1 mg Per Tube BID  . dorzolamide-timolol  1 drop Both Eyes BID  . enoxaparin (LOVENOX) injection  40 mg Subcutaneous Q24H  . feeding supplement (PRO-STAT SUGAR FREE 64)  60 mL Per Tube QID  . feeding supplement (VITAL HIGH PROTEIN)  1,000 mL Per Tube Q24H  . insulin aspart  0-15 Units Subcutaneous 6 times per day  . isosorbide dinitrate  10 mg Oral BID  . magnesium oxide  400 mg Oral Daily  . multivitamin  5 mL Oral Daily  . pantoprazole (PROTONIX) IV  40 mg Intravenous QHS  . pneumococcal 23 valent vaccine  0.5 mL Intramuscular Tomorrow-1000  .  polyethylene glycol  17 g Oral Daily  . polyvinyl alcohol  1 drop Both Eyes BID  . potassium chloride  20 mEq Per Tube BID  . senna-docusate  1 tablet Oral BID   PRN:  acetaminophen, acetaminophen, fentaNYL, hydrALAZINE, labetalol  Diet:  NPO Getting TF Activity:  As tolerated DVT Prophylaxis:  Lovenox 40 mg sq daily   CLINICALLY SIGNIFICANT STUDIES Basic Metabolic Panel:   Recent Labs Lab 10/07/13 0145  10/08/13 0500 10/09/13 0410  NA 139  < > 146 141  K 3.4*  < > 3.3* 3.3*  CL 103  < > 107 103  CO2 26  < > 27 26  GLUCOSE 158*  < > 185* 180*  BUN 26*  < > 33* 32*  CREATININE 1.01  < > 1.13 1.09  CALCIUM 8.8  < > 9.1 9.1  MG 2.2  --   --  2.1  PHOS  --   --   --  2.9  < > = values in this interval not displayed. Liver Function Tests:   Recent Labs Lab 10/04/13 0758  AST 18  ALT 30  ALKPHOS 110  BILITOT 0.7  PROT  6.9  ALBUMIN 2.4*   CBC:   Recent Labs Lab 10/07/13 0500 10/08/13 0500  WBC 7.4 8.3  NEUTROABS 4.0  --   HGB 10.8* 11.0*  HCT 33.9* 35.5*  MCV 81.5 82.0  PLT 183 180   Coagulation:  No results found for this basename: LABPROT, INR,  in the last 168 hours Cardiac Enzymes:  No results found for this basename: CKTOTAL, CKMB, CKMBINDEX, TROPONINI,  in the last 168 hours Urinalysis:  No results found for this basename: COLORURINE, APPERANCEUR, LABSPEC, PHURINE, GLUCOSEU, HGBUR, BILIRUBINUR, KETONESUR, PROTEINUR, UROBILINOGEN, NITRITE, LEUKOCYTESUR,  in the last 168 hours Lipid Panel    Component Value Date/Time   CHOL 134 10/05/2013 0500   TRIG 160* 10/08/2013 0500   HDL 37* 10/05/2013 0500   CHOLHDL 3.6 10/05/2013 0500   VLDL 30 10/05/2013 0500   LDLCALC 67 10/05/2013 0500   HgbA1C  Lab Results  Component Value Date   HGBA1C 8.2* 10/02/2013    Urine Drug Screen:     Component Value Date/Time   LABOPIA NONE DETECTED 10/01/2013 1915   COCAINSCRNUR NONE DETECTED 10/01/2013 1915   COCAINSCRNUR NEG 12/16/2008 2246   LABBENZ NONE DETECTED 10/01/2013  1915   LABBENZ NEG 12/16/2008 2246   AMPHETMU NONE DETECTED 10/01/2013 1915   AMPHETMU NEG 12/16/2008 2246   THCU NONE DETECTED 10/01/2013 1915   LABBARB NONE DETECTED 10/01/2013 1915    Alcohol Level:  No results found for this basename: ETH,  in the last 168 hours  CT of the brain   10/08/2013 Interval increase in size of left temporoparietal intraparenchymal hematoma, was 2.2 x 3.1 cm, now 2.9 x 3.5 cm concerning for rebleed/ propagation of hemorrhagic stroke. Local mass effect without midline shift. No acute large vascular territory.  10/05/2013 Contracting left temporoparietal intraparenchymal hematoma was 2.7 x 3.8 cm, now 2.2 x 3.1 cm without rebleed. 10/03/2013 Contracting left temporal parietal intraparenchymal hematoma, no rebleed. Moderate white matter changes suggest chronic small vessel ischemic disease. 10/02/2013   Limited portable CT: Skullbase and posterior fossa not been included.  Evolving left temporoparietal intraparenchymal hematoma with similar mass effect. No new hemorrhage.   10/01/2013    1. 4 x 3 cm left temporal/parietal parenchymal hematoma. 2. 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 3. Diffuse inflammatory paranasal sinus disease.     MRI/MRA head pacemaker  2D Echo There is a mass present in the right atrium and right ventricle. It is not seen in the IVC. It was not present on the study of May, 2014. Considering all factors, the most likely etiology is clot. The mass is seen in the RA. There is also mass that moves across the tricuspid valve. There is probably also mass in the RV cavity. I can not be sure, but all of these targets might be connected.  Carotid Doppler (on hold d/t IJ)  LE Venous Dopplers There is no evidence of DVT or SVT in the bilateral lower extremities.  CT angio chest 1. Right lower lobar pulmonary embolus. 2. Left upper lobe on bilateral lower lobe airspace disease. While this likely represents atelectasis, early infection is not  excluded. 3. Mild cardiomegaly and atherosclerotic disease. 4. Support apparatus as described.  CXR   10/09/2012 10/07/2013 Enlargement of cardiac silhouette. Mild bibasilar atelectasis with improved aeration at right base versus previous study. 10/07/2013 Suboptimal exam due to positioning. Persistent low lung volumes and right lower lung opacity. 10/06/2013 Increased right base atelectasis and effusion. 10/05/2013 Stable cardiomegaly, bibasilar airspace opacities with  improved  aeration of left lung base. Probable right middle lobe collapse. Small residual pleural effusions.  10/04/2013 Decreased right basilar atelectasis. Otherwise no significant  change. 10/03/2013 Fairly small right pleural effusion with right base atelectasis. Lungs are otherwise clear. Tube and catheter positions as described without pneumothorax. 10/02/2013    Endotracheal tube 4 cm above the carina. Left central line tip projected laterally towards the lateral wall of the upper SVC. No pneumothorax.  Cardiomegaly with vascular congestion.  Small effusions and bibasilar atelectasis.    10/01/2013   The appearance of the pulmonary interstitium suggests mild interstitial edema likely secondary to CHF. There is no evidence of pneumonia or pleural effusion.     EKG  Sinus or ectopic atrial rhythm. Prolonged PR interval. Anterior infarct, old. No significant change was found.   Therapy Recommendations   Physical Exam General: The patient is obese. He is intubated. Cardiovascular: Distant heart sounds, regular rate. Abdomen: Obese, nontender, bowel sounds present but diminished. Skin: No significant peripheral edema is noted. 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  Neurologic Exam Mental status: The patient is intubated and sedated. Cranial nerves: Facial symmetry is present. The patient is intubated, sedated. Eyes are deviated to the left, no blink to threat. Some facial grimace is noted with pain stimulation. The patient will open  his eyes with stimulation. Motor: The patient has withdrawal movements on all 4 extremities. Sensory examination: The patient will respond to deep pain stimulation with withdrawal on all 4 extremities. Coordination: Cerebellar testing cannot be performed. Gait and station: The patient is on bedrest, the patient could not be ambulated. Reflexes: Deep tendon reflexes are symmetric, but are depressed.   ASSESSMENT [name] is a 68 y.o. male presenting with altered speech. Imaging confirms a left temporal parietal IPH with significant surrounding edema - no hydrocephalus, no [name]. Suspect underlying mass or ischemic infarct given amt of edema, possible vegetation. Unable to get MRI to confirm due to pacer. Slight increase in hemmorrhage size on last scan.  Has respiratory insufficiency due to PE from mobile mass (clot vs vegetation) found last week on pacer wire in RA extending into RV. Not an anticoagulation candidate for treatment due to Axis and continued increase in size.  On aspirin 81 mg orally every day prior to admission. Patient purposeful, localizes; global aphasia, right hemirparesis, dysphagia.  Right atrial clot, not a anticoagulation candidate due to hemorrhage RLL pulmonary embolus, not a anticoagulation candidate due to hemorrhage Respiratory failure, intubated  due to respiratory insufficiency, low sats after admission. Extubated/reintubated 2/18 am due to pulmonary edema vs aspiration pneumonia. Started on vanc and zosyn. Also has PE from mobile mass. Suspected OSA/OHS atrial fibrillation, not a coumadin candidate secondary to hemorrhage. Not on prior to admission due to hx rectal bleeding. hypertension Hyperlipidemia, on crestor 40 mg daily PTA, now on no statin as NPO Diabetes, uncontrolled, HgbA1c 8.2, goal 7.0 obstructive sleep apnea Obesity, Body mass index is 37.34 kg/(m^2).   Pacemaker. intermittant CHB with appropriate PPM function.  Legally  blind  Herpes  LDL 67  CAD  Non-ischemic cardiomyopathy  Chronic kidney disease, stage 3  Hypokalemia, K 3.3  Hospital day # 8  TREATMENT/PLAN  Continue ICU level care  Reconsider carotid doppler once IJ is removed  Not a candidate for antiplatelet due to increasing size of hemorrhage. Will reconsider antiplatelet in 1 week. Check CT prior to initiating  Likely SNF at time of discharge  Consider trach and PEG; CCM attempting wean  This patient is critically ill and at significant risk of neurological worsening, death and care requires constant monitoring of vital signs, hemodynamics,respiratory and cardiac monitoring,review of multiple databases, neurological assessment, discussion with family, other specialists and medical decision making of high complexity. I spent 30 minutes of neurocritical care time  in the care of  this patient.  Burnetta Sabin, MSN, RN, ANVP-BC, ANP-BC, [facility] Pager: [phone] 10/09/2013 7:57 AM  I have personally obtained a history, examined the patient, evaluated imaging results, and formulated the assessment and plan of care. I agree with the above.  Antony Contras, MD

## 2013-10-09 NOTE — Progress Notes (Signed)
NUTRITION FOLLOW UP  DOCUMENTATION CODES  Per approved criteria  -Obesity Unspecified   Intervention:    Increase Vital High protein by 10 ml/hr every 4 hours to reach new goal rate of 65 ml/hr to provide 1560 kcal, 137 grams protein, and 1304 ml free water  Reduce Prostat to 30 ml BID  Above TF regimen, Prostat, and current propofol provides 1963 kcal (24 kcal/kg ideal body weight), 167 grams protein, and 1304 ml free water  Discontinue daily MVI, patient receiving adequate micronutrients via TF formula  Nutrition Dx:   Inadequate oral intake related to inability to eat as evidenced by NPO status, ongoing.  Goal:   Enteral nutrition to provide 60-70% of estimated calorie needs (22-25 kcals/kg ideal body weight) and 100% of estimated protein needs, based on ASPEN guidelines for permissive underfeeding in critically ill obese individuals, met.  Monitor:   Vent status, TF initiation and tolerance, weight trend, labs  Assessment:   Pt with multiple medical co-morbidities (uncontrolled DM) who lives with a roommate admitted with altered mental status. Pt with left temp/parietal ICH. Pt desaturated and was intubated.   Patient extubated 2/18. Patient re-intubated later on 2/18 due to respiratory failure. RD team received consult for TF initiation and management.  Patient currently with right atrial mass, Mural thrombus and possible PNA. Patient is to remain intubated due to CNS issues. Per progress notes, patient needs trach and possibly PEG.  Propofol rate trending down, TF regimen adjusted to meet patient needs.   Patient is currently intubated on ventilator support.  MV:8.6 L/min Temp (24hrs), Avg:98 F (36.7 C), Min:97.7 F (36.5 C), Max:98.6 F (37 C) Propofol: 7.7 ml/hr providing 203 kcal daily  Potassium low at 3.3. Magnesium and Phosphorous WNL.   Height: Ht Readings from Last 1 Encounters:  10/02/13 6' (1.829 m)    Weight Status:   Wt Readings from Last 1  Encounters:  10/09/13 275 lb 5.7 oz (124.9 kg)  10/02/13          289 lb   Body mass index is 37.34 kg/(m^2).   Re-estimated needs:  Kcal: 2083 Protein: >162 grams Fluid: >2 L  Skin: no wounds  Diet Order: NPO   Intake/Output Summary (Last 24 hours) at 10/09/13 1215 Last data filed at 10/09/13 1200  Gross per 24 hour  Intake 1435.62 ml  Output   1520 ml  Net -84.38 ml    Last BM: 2/23   Labs:   Recent Labs Lab 10/07/13 0145 10/07/13 0500 10/08/13 0500 10/09/13 0410  NA 139 139 146 141  K 3.4* 3.3* 3.3* 3.3*  CL 103 104 107 103  CO2 _0 BUN 26* 24* 33* 32*  CREATININE 1.01 0.95 1.13 1.09  CALCIUM 8.8 8.8 9.1 9.1  MG 2.2  --   --  2.1  PHOS  --   --   --  2.9  GLUCOSE 158* 176* 185* 180*    CBG (last 3)   Recent Labs  10/09/13 0011 10/09/13 0337 10/09/13 0800  GLUCAP 193* 167* 145*    Scheduled Meds: . amiodarone  200 mg Oral Daily  . antiseptic oral rinse  15 mL Mouth Rinse QID  . atorvastatin  10 mg Oral q1800  . brimonidine  1 drop Both Eyes TID  . carvedilol  25 mg Per Tube BID WC  . cefTRIAXone (ROCEPHIN)  IV  1 g Intravenous Q24H  . chlorhexidine  15 mL Mouth Rinse BID  . cloNIDine  0.1  mg Per Tube BID  . dorzolamide-timolol  1 drop Both Eyes BID  . enoxaparin (LOVENOX) injection  40 mg Subcutaneous Q24H  . feeding supplement (PRO-STAT SUGAR FREE 64)  60 mL Per Tube QID  . feeding supplement (VITAL HIGH PROTEIN)  1,000 mL Per Tube Q24H  . insulin aspart  0-15 Units Subcutaneous 6 times per day  . isosorbide dinitrate  10 mg Oral BID  . magnesium oxide  400 mg Oral Daily  . multivitamin  5 mL Oral Daily  . pantoprazole sodium  40 mg Per Tube QHS  . pneumococcal 23 valent vaccine  0.5 mL Intramuscular Tomorrow-1000  . polyethylene glycol  17 g Oral Daily  . polyvinyl alcohol  1 drop Both Eyes BID  . senna-docusate  1 tablet Oral BID    Continuous Infusions: . sodium chloride 20 mL/hr at 10/09/13 1200  . sodium chloride     . propofol 10 mcg/kg/min (10/09/13 1200)    Claudell Kyle, Dietetic Intern Pager: [phone]  Intern note/chart reviewed. Revisions made.  [place], [place], [place] Pager [phone] After Hours Pager

## 2013-10-10 ENCOUNTER — Inpatient Hospital Stay (HOSPITAL_COMMUNITY): Payer: Medicare Other

## 2013-10-10 DIAGNOSIS — I6350 Cerebral infarction due to unspecified occlusion or stenosis of unspecified cerebral artery: Secondary | ICD-10-CM

## 2013-10-10 LAB — BASIC METABOLIC PANEL
BUN: 35 mg/dL — ABNORMAL HIGH (ref 6–23)
CO2: 25 mEq/L (ref 19–32)
Calcium: 9.3 mg/dL (ref 8.4–10.5)
Chloride: 109 mEq/L (ref 96–112)
Creatinine, Ser: 1.11 mg/dL (ref 0.50–1.35)
GFR, EST AFRICAN AMERICAN: 79 mL/min — AB (ref >90)
GFR, EST NON AFRICAN AMERICAN: 68 mL/min — AB (ref >90)
Glucose, Bld: 211 mg/dL — ABNORMAL HIGH (ref 70–99)
POTASSIUM: 3.3 meq/L — AB (ref 3.7–5.3)
Sodium: 146 mEq/L (ref 137–147)

## 2013-10-10 LAB — GLUCOSE, CAPILLARY
GLUCOSE-CAPILLARY: 191 mg/dL — AB (ref 70–99)
GLUCOSE-CAPILLARY: 221 mg/dL — AB (ref 70–99)
GLUCOSE-CAPILLARY: 196 mg/dL — AB (ref 70–99)
Glucose-Capillary: 171 mg/dL — ABNORMAL HIGH (ref 70–99)
Glucose-Capillary: 173 mg/dL — ABNORMAL HIGH (ref 70–99)
Glucose-Capillary: 185 mg/dL — ABNORMAL HIGH (ref 70–99)

## 2013-10-10 LAB — CULTURE, BLOOD (ROUTINE X 2)
Culture: NO GROWTH
Culture: NO GROWTH

## 2013-10-10 LAB — LACTIC ACID, PLASMA: LACTIC ACID, VENOUS: 1.2 mmol/L (ref 0.5–2.2)

## 2013-10-10 LAB — PHOSPHORUS: Phosphorus: 2.7 mg/dL (ref 2.3–4.6)

## 2013-10-10 LAB — MAGNESIUM: MAGNESIUM: 2.2 mg/dL (ref 1.5–2.5)

## 2013-10-10 NOTE — Progress Notes (Signed)
Pt passed SBT of CP/PS 5/5, however due to no plans to extubate the pt the PS was increased to 10. Pt is tolerating well at this time. RT will monitor.

## 2013-10-10 NOTE — Progress Notes (Addendum)
Noted that trach is scheduled for Friday. Pt has traditional Medicare and would be a candidate for LTACH. MD--Please order if you feel it is appropriate.   UPDATE:  1134--Received CM consult from MD for [facility] referral. Spoke with daughter at pt's bedside and explained LTACH option and offered choice of local and regional locations. She said staying in [place] would be the best option for the patient and rest of his family. She requested information on both facilities locally, [facility] and [facility].  I gave patient the address, phone number and website for both locations so that she could do her own research before making a decision. Daughter stated understanding of process.

## 2013-10-10 NOTE — Progress Notes (Signed)
SUBJECTIVE:  Intubated but opens eyes.     PHYSICAL EXAM Filed Vitals:   10/10/13 1000 10/10/13 1100 10/10/13 1200 10/10/13 1214  BP: 165/91 144/81 110/64   Pulse: 73 67    Temp:    98.4 F (36.9 C)  TempSrc:    Axillary  Resp: 16 21    Height:      Weight:      SpO2: 97% 97%     General:   No acute distress Lungs:  Clear Heart:  RRR, paced Abdomen:  Decreased bowel sounds, abdomen distended. Extremities:   No edema   LABS:  Results for orders placed during the hospital encounter of 10/01/13 (from the past 24 hour(s))  GLUCOSE, CAPILLARY     Status: Abnormal   Collection Time    10/09/13  4:33 PM      Result Value Ref Range   Glucose-Capillary 151 (*) 70 - 99 mg/dL  GLUCOSE, CAPILLARY     Status: Abnormal   Collection Time    10/09/13  7:50 PM      Result Value Ref Range   Glucose-Capillary 151 (*) 70 - 99 mg/dL   Comment 1 Notify RN     Comment 2 Documented in Chart    GLUCOSE, CAPILLARY     Status: Abnormal   Collection Time    10/09/13 11:50 PM      Result Value Ref Range   Glucose-Capillary 173 (*) 70 - 99 mg/dL   Comment 1 Documented in Chart     Comment 2 Notify RN    GLUCOSE, CAPILLARY     Status: Abnormal   Collection Time    10/10/13  3:52 AM      Result Value Ref Range   Glucose-Capillary 171 (*) 70 - 99 mg/dL   Comment 1 Documented in Chart     Comment 2 Notify RN    BASIC METABOLIC PANEL     Status: Abnormal   Collection Time    10/10/13  5:25 AM      Result Value Ref Range   Sodium 146  137 - 147 mEq/L   Potassium 3.3 (*) 3.7 - 5.3 mEq/L   Chloride 109  96 - 112 mEq/L   CO2 25  19 - 32 mEq/L   Glucose, Bld 211 (*) 70 - 99 mg/dL   BUN 35 (*) 6 - 23 mg/dL   Creatinine, Ser 1.11  0.50 - 1.35 mg/dL   Calcium 9.3  8.4 - 10.5 mg/dL   GFR calc non Af Amer 68 (*) >90 mL/min   GFR calc Af Amer 79 (*) >90 mL/min  PHOSPHORUS     Status: None   Collection Time    10/10/13  5:25 AM      Result Value Ref Range   Phosphorus 2.7  2.3 - 4.6 mg/dL   MAGNESIUM     Status: None   Collection Time    10/10/13  5:25 AM      Result Value Ref Range   Magnesium 2.2  1.5 - 2.5 mg/dL  LACTIC ACID, PLASMA     Status: None   Collection Time    10/10/13  5:25 AM      Result Value Ref Range   Lactic Acid, Venous 1.2  0.5 - 2.2 mmol/L  GLUCOSE, CAPILLARY     Status: Abnormal   Collection Time    10/10/13  7:53 AM      Result Value Ref Range   Glucose-Capillary 191 (*)  70 - 99 mg/dL  GLUCOSE, CAPILLARY     Status: Abnormal   Collection Time    10/10/13 12:13 PM      Result Value Ref Range   Glucose-Capillary 221 (*) 70 - 99 mg/dL    Intake/Output Summary (Last 24 hours) at 10/10/13 1300 Last data filed at 10/10/13 1200  Gross per 24 hour  Intake 2111.52 ml  Output   1530 ml  Net 581.52 ml    ASSESSMENT AND PLAN:  CVA (cerebral infarction)/Intracerebral hemorrhage:  No anticoagulation at this time.  However we will continue to discuss risk benefits with neurology over the next several days/wks given the high risk without anticoagulation.  Per neuro:  Possible anticoagulation in "one week"    We can clarify this after the trach and before transfer     Acute respiratory failure/Aspiration pneumonia:  Question early trach per CCM.   Mural thrombus of heart:  TEE today confirms mass in the right side of the heart.  This is a large mass consistent with clot on the pacemaker lead and prolapsing.  No options for intervention at this point and he is not a anticoagulation candidate.  No DVT.  No indication for filter. No evidence of SBE. Plan to stop the antibiotics tomorrow.  I agree.     Pulmonary emboli:  See above.   Jeneen Rinks Yareth Macdonnell 10/10/2013 1:00 PM

## 2013-10-10 NOTE — Consult Note (Signed)
Condition is a 68 year old male who is status post hemorrhagic stroke on 10/01/2013. He subsequently required intubation for respiratory failure. Since February 17 he has been intubated and has not been able to wean off the ventilator. Currently the plans or to place a tracheostomy tube in a patient on this coming Friday which would be February 27. I have been requested to see the patient for possible endoscopic gastrostomy tube.  From reviewing the patient's chart currently in looking at his abdominal examination I can find no contraindications to performing a percutaneous endoscopic gastrostomy tube. The patient is obese and there is a possibility that the weakness of his abdominal wall may preclude the possibility of performing a PEG. However I will go to the endoscopy suite to see if they do have extra long tubes for Angiocath for [place] to perform the procedure.  The patient has recently been started on prophylactic Lovenox. This does not have to be stopped prior to our procedure on Friday. I discussed with the patient's daughter the need to perform this procedure on Friday. She has consented and signed the consent form.  Kathryne Eriksson. Dahlia Bailiff, MD, [place] [phone] Trauma Surgeon

## 2013-10-10 NOTE — Progress Notes (Signed)
Inform consent for Trach and PEG signed and  placed in chart. Will continue to monitor.

## 2013-10-10 NOTE — Progress Notes (Signed)
PULMONARY / CRITICAL CARE MEDICINE  Name: [name] MRN: [id number] DOB: 02/22/1946   LOS 9  days   ADMISSION DATE:  10/01/2013 CONSULTATION DATE:  10/02/2013  REFERRING MD :  Stroke PRIMARY SERVICE: Stroke  CHIEF COMPLAINT:  Acute respiratory failure   BRIEF PATIENT DESCRIPTION: 68 year old male with multiple co-morbids including suspected OSA/OHS. Admitted for left temporal / parietal ICH on 2/16. Developed decrease LOC and progressive respiratory failure early in the am on 2/17. PCCM asked to see emergently in consult.   SIGNIFICANT EVENTS / STUDIES:  2/16  Head CT >>> 1. 4 x 3 cm left temporal/parietal parenchymal hematoma. 2. 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 3. Diffuse inflammatory paranasal sinus disease. 2/17  Head CT >>>  Evolving left temporoparietal intraparenchymal hematoma with similar mass effect. No new hemorrhage. 2/18  Head CT >>>  Contracting left temporal parietal intraparenchymal hematoma, no rebleed. Moderate white matter changes suggest chronic small vessel ischemic disease. 2/18  Extubated / re-intubated due to pulmonary edema vs aspiration pneumonia vs other 2/19  TTE >>> mobile mass in RA on the pacer wire, likely clot (vs vegetation) 2/19  CT-PA >> R LL acute PE, LUL and BLL atx vs infiltrates 10/08/13 - NEURO: increase size of Left temp/parietal hemorrhage to 3.5cm without midline shift. Neuro advises holding off anticoagulation for one more week at the least  10/08/13: CARDS: Mural thrombus of heart: TEE confirms mass in the right side of the heart. This is a large mass consistent with clot on the pacemaker lead and prolapsing. No options for intervention at this point and he is not a anticoagulation candidate. No DVT. No indication for filter. 2/25 Spoke with daughter, ok to proceed with trach/peg, plan trach on Friday at 10 AM.  LINES / TUBES: L IJ CVL 2/17 >>> OETT 2/17 >>> 2/18; 2/18 >>> OGT 1/17 >>> 2/18; 2/18 >>> Foley 2/17  >>>  CULTURES: MRSA PCR 2/16 - negative Blood 2/18 >> ngtd  resp 2/19 >> oropharyngeal flora  ANTIBIOTICS: Zosyn 2/18 >>>2/22 Vancomycin 2/18 >>>2/22 ceftriaxone 2/22>>> (2/26)  SUBJECTIVE:  No events overnight, awake but not following commands.  VITAL SIGNS: Temp:  [97.7 F (36.5 C)-98.9 F (37.2 C)] 98.6 F (37 C) (02/25 0744) Pulse Rate:  [69-89] 71 (02/25 0815) Resp:  [10-19] 18 (02/25 0815) BP: (95-177)/(57-89) 148/78 mmHg (02/25 0815) SpO2:  [85 %-100 %] 97 % (02/25 0815) FiO2 (%):  [30 %] 30 % (02/25 0800) Weight:  [124.8 kg (275 lb 2.2 oz)] 124.8 kg (275 lb 2.2 oz) (02/25 0500)  HEMODYNAMICS:   VENTILATOR SETTINGS: Vent Mode:  [-] CPAP;PSV FiO2 (%):  [30 %] 30 % Set Rate:  [16 bmp] 16 bmp Vt Set:  [670 mL] 670 mL PEEP:  [5 cmH20] 5 cmH20 Pressure Support:  [10 cmH20] 10 cmH20 Plateau Pressure:  [15 cmH20-18 cmH20] 18 cmH20  INTAKE / OUTPUT: Intake/Output     02/24 0701 - 02/25 0700 02/25 0701 - 02/26 0700   I.V. (mL/kg) 716.7 (5.7) 35.4 (0.3)   NG/GT 1147.5 65   IV Piggyback 50    Total Intake(mL/kg) 1914.2 (15.3) 100.4 (0.8)   Urine (mL/kg/hr) 1435 (0.5) 185 (0.6)   Total Output 1435 185   Net +479.2 -84.6         PHYSICAL EXAMINATION: General:  Mechanically ventilated, synchronous Neuro: Eyes open, not following command. HEENT:  OETT / OGT intact, PERRL. Cardiovascular:  Regular, no m/g/r noted. Lungs:  Bilateral air entry, few rhonchi. Abdomen:  Obese, soft, nontender, nondistended +bs. Musculoskeletal:  Trace edema. Skin:  Intact.  LABS:  PULMONARY No results found for this basename: PHART, PCO2, PCO2ART, PO2, PO2ART, HCO3, TCO2, O2SAT,  in the last 168 hours  CBC  Recent Labs Lab 10/06/13 0325 10/07/13 0500 10/08/13 0500  HGB 10.9* 10.8* 11.0*  HCT 34.4* 33.9* 35.5*  WBC 8.8 7.4 8.3  PLT 168 183 180   COAGULATION No results found for this basename: INR,  in the last 168 hours  CARDIAC  No results found for this basename:  TROPONINI,  in the last 168 hours  Recent Labs Lab 10/09/13 0410  PROBNP 378.9*   CHEMISTRY  Recent Labs Lab 10/07/13 0145 10/07/13 0500 10/08/13 0500 10/09/13 0410 10/10/13 0525  NA 139 139 146 141 146  K 3.4* 3.3* 3.3* 3.3* 3.3*  CL 103 104 107 103 109  CO2 26 25 27 26 25   GLUCOSE 158* 176* 185* 180* 211*  BUN 26* 24* 33* 32* 35*  CREATININE 1.01 0.95 1.13 1.09 1.11  CALCIUM 8.8 8.8 9.1 9.1 9.3  MG 2.2  --   --  2.1 2.2  PHOS  --   --   --  2.9 2.7   Estimated Creatinine Clearance: 90.6 ml/min (by C-G formula based on Cr of 1.11).  LIVER  Recent Labs Lab 10/04/13 0758  AST 18  ALT 30  ALKPHOS 110  BILITOT 0.7  PROT 6.9  ALBUMIN 2.4*   INFECTIOUS  Recent Labs Lab 10/04/13 0758 10/05/13 0500 10/06/13 0325 10/10/13 0525  LATICACIDVEN  --   --   --  1.2  PROCALCITON 0.28 0.19 0.12  --    ENDOCRINE CBG (last 3)   Recent Labs  10/09/13 2350 10/10/13 0352 10/10/13 0753  GLUCAP 173* 171* 191*   IMAGING x48h  Dg Chest Port 1 View  10/10/2013   CLINICAL DATA:  Intubation.  EXAM: PORTABLE CHEST - 1 VIEW  COMPARISON:  DG CHEST 1V PORT dated 10/09/2013; DG CHEST 1V PORT dated 10/07/2013  FINDINGS: Endotracheal tube and NG tube in stable position. Left IJ line in stable position. Stable cardiomegaly. Cardiomegaly is severe.Cardiac pacer is in stable position. Mild clearing of bibasilar atelectasis and/or infiltrates. No pleural effusion or pneumothorax.  IMPRESSION: 1. Line and tube position stable. 2. Severe stable cardiomegaly. 3. Slight clearing of mild bibasilar atelectasis and/or infiltrates.   Electronically Signed   By: Marcello Moores  Register   On: 10/10/2013 07:37   Dg Chest Port 1 View  10/09/2013   CLINICAL DATA:  Stroke.  Acute respiratory failure.  EXAM: PORTABLE CHEST - 1 VIEW  COMPARISON:  Single view of the chest 10/07/2013 and 10/06/2013.  FINDINGS: The patient's left IJ catheter has backed out slightly with the tip now just in the superior vena cava.  Support tubes and lines are otherwise unchanged. Bibasilar atelectasis is again seen. There is cardiomegaly but no edema.  IMPRESSION: Cardiomegaly without edema.  No change in bibasilar atelectasis.  Left IJ catheter has backed out slightly with the tip now just in the superior vena cava.   Electronically Signed   By: Inge Rise M.D.   On: 10/09/2013 08:13   Dg Abd Portable 1v  10/08/2013   CLINICAL DATA:  OG tube placement.  EXAM: PORTABLE ABDOMEN - 1 VIEW  COMPARISON:  09/1913  FINDINGS: Orogastric tube has been placed, tip overlying the level of the mid to distal stomach. Bowel gas pattern is nonobstructive.  IMPRESSION: Interval repositioning of orogastric tube.   Electronically  Signed   By: Shon Hale M.D.   On: 10/08/2013 17:49   ASSESSMENT / PLAN:  PULMONARY A:  Acute respiratory failure, re intubated 2/18 due to PE from Right atrial lead clot Possible PNA vs atelectasis Acute RLL PE Suspected OSA/OHS DUplex LE negative for DVT 10/08/13 - Unable to meet extubation criteria due to CNS issues  P:   - Begin PS trials but no extubation due to inability to protect airway. - Anticoagulation contraindicated at this time; Consider starting heparin gtt reassess 10/14/13 Clearly there are risks/benefits on each side.  - Hold off IVC filter for nw; SCDs. - Plan to trach on Friday at 10 AM.  CARDIOVASCULAR A:   HTN Chronic diastolic heart failure Transient hypotension after sedation / intubation, resolved H/o CAD, AF, non-ischemic cardiomyopathy, HTN R atrial mass, Mural thrombus- attached to pacer wire- Looking less likely this is endocarditis P:  - Per Cards. - Goal SBP <160. - Empiric abx for possibility of endocarditis/ RA vegetation; need to establish stop date. - Not a candicate for thrombectomy when he cannot be heparinized or direct t-PA. - Hold ASA. - Labetalol PRN.  RENAL A:   CKD stage 3 Hypokalemia Continued pos balance  P:   - Trend  BMP.  GASTROINTESTINAL A:   Obesity Mild elevated LFTs, resolved GERD on PPI preadmission Nutrition P:   - Maintain TF per Nutritionist. - Protonix.  HEMATOLOGIC A:   Mild anemia  dilution P:  - Trend CBC, hgb stable. - PRBC for hgb < 7gm%.  INFECTIOUS A:   Possible pneumonia vs atx, Pct is equivocal. TEE 10/08/13: Mural thrombus and  Not endocarditis  P:   - End ceftriaxone on 10/11/13.  ENDOCRINE A:   DM  P:   - SSI.  NEUROLOGIC A: Left temp/parietal ICH w associated edema Acute encephalopathy Worsening hemorrhage 10/08/13 imaging of brain  P:   - Appreciate stroke team assistance. - Goal RASS 0 to -1. - Propofol gtt; check lactic acid and CK 10/10/13. - Fentanyl PRN. - No anticoagulation atleast till 10/14/13 and then reassess.  GLOBAL Spoke with daughter, will proceed with trach on Friday.  Will call trauma surgery for PEG as well then placement post that.  No anticoag until ok with neuro.  The patient is critically ill with multiple organ systems failure and requires high complexity decision making for assessment and support, frequent evaluation and titration of therapies, application of advanced monitoring technologies and extensive interpretation of multiple databases.   Critical Care Time devoted to patient care services described in this note is  35  Minutes.  Rush Farmer, M.D. [facility] Pulmonary/Critical Care Medicine. Pager: [phone]. After hours pager: [phone].

## 2013-10-10 NOTE — Progress Notes (Signed)
Stroke Team Progress Note  HISTORY Tyberious Tweten is an 68 y.o. male who lives with a roommate. Roommate felt that patient was not at his baseline today and this morning felt hat he was hallucinating. He went to his day program where they noticed that he was not talking right. EMS was called at that time. Last known well 09/30/2013 at 22:30. Patient was not administerd TPA secondary to delay in arrival. He was admitted to the neuro ICU for further evaluation and treatment.  SUBJECTIVE Daughter at the bedside. Hemodynamically stable. More alert but remains aphasic .  OBJECTIVE Most recent Vital Signs: Filed Vitals:   10/10/13 0530 10/10/13 0600 10/10/13 0630 10/10/13 0700  BP: 143/76 161/82 166/78 177/85  Pulse: 70 70 70 70  Temp:      TempSrc:      Resp: 16 14 16 16   Height:      Weight:      SpO2: 97% 97% 97% 97%   CBG (last 3)   Recent Labs  10/09/13 1950 10/09/13 2350 10/10/13 0352  GLUCAP 151* 173* 171*    IV Fluid Intake:   . sodium chloride 20 mL/hr at 10/09/13 1900  . sodium chloride    . feeding supplement (VITAL HIGH PROTEIN) 1,000 mL (10/10/13 0700)  . propofol 20 mcg/kg/min (10/10/13 0700)    MEDICATIONS  . amiodarone  200 mg Oral Daily  . antiseptic oral rinse  15 mL Mouth Rinse QID  . atorvastatin  10 mg Oral q1800  . brimonidine  1 drop Both Eyes TID  . carvedilol  25 mg Per Tube BID WC  . cefTRIAXone (ROCEPHIN)  IV  1 g Intravenous Q24H  . chlorhexidine  15 mL Mouth Rinse BID  . cloNIDine  0.1 mg Per Tube BID  . dorzolamide-timolol  1 drop Both Eyes BID  . enoxaparin (LOVENOX) injection  40 mg Subcutaneous Q24H  . feeding supplement (PRO-STAT SUGAR FREE 64)  30 mL Per Tube BID  . insulin aspart  0-15 Units Subcutaneous 6 times per day  . isosorbide dinitrate  10 mg Oral BID  . magnesium oxide  400 mg Oral Daily  . pantoprazole sodium  40 mg Per Tube QHS  . pneumococcal 23 valent vaccine  0.5 mL Intramuscular Tomorrow-1000  . polyethylene glycol   17 g Oral Daily  . polyvinyl alcohol  1 drop Both Eyes BID  . senna-docusate  1 tablet Oral BID   PRN:  fentaNYL, hydrALAZINE, labetalol  Diet:  NPO Getting TF Activity:  As tolerated DVT Prophylaxis:  Lovenox 40 mg sq daily   CLINICALLY SIGNIFICANT STUDIES Basic Metabolic Panel:   Recent Labs Lab 10/09/13 0410 10/10/13 0525  NA 141 146  K 3.3* 3.3*  CL 103 109  CO2 26 25  GLUCOSE 180* 211*  BUN 32* 35*  CREATININE 1.09 1.11  CALCIUM 9.1 9.3  MG 2.1 2.2  PHOS 2.9 2.7   Liver Function Tests:   Recent Labs Lab 10/04/13 0758  AST 18  ALT 30  ALKPHOS 110  BILITOT 0.7  PROT 6.9  ALBUMIN 2.4*   CBC:   Recent Labs Lab 10/07/13 0500 10/08/13 0500  WBC 7.4 8.3  NEUTROABS 4.0  --   HGB 10.8* 11.0*  HCT 33.9* 35.5*  MCV 81.5 82.0  PLT 183 180   Coagulation:  No results found for this basename: LABPROT, INR,  in the last 168 hours Cardiac Enzymes:   Recent Labs Lab 10/09/13 1018  CKTOTAL 45  CKMB <1.0  Urinalysis:  No results found for this basename: COLORURINE, APPERANCEUR, LABSPEC, [place], GLUCOSEU, HGBUR, BILIRUBINUR, KETONESUR, PROTEINUR, UROBILINOGEN, NITRITE, LEUKOCYTESUR,  in the last 168 hours Lipid Panel    Component Value Date/Time   CHOL 134 10/05/2013 0500   TRIG 160* 10/08/2013 0500   HDL 37* 10/05/2013 0500   CHOLHDL 3.6 10/05/2013 0500   VLDL 30 10/05/2013 0500   LDLCALC 67 10/05/2013 0500   HgbA1C  Lab Results  Component Value Date   HGBA1C 8.2* 10/02/2013    Urine Drug Screen:     Component Value Date/Time   LABOPIA NONE DETECTED 10/01/2013 1915   COCAINSCRNUR NONE DETECTED 10/01/2013 1915   COCAINSCRNUR NEG 12/16/2008 2246   LABBENZ NONE DETECTED 10/01/2013 1915   LABBENZ NEG 12/16/2008 2246   AMPHETMU NONE DETECTED 10/01/2013 1915   AMPHETMU NEG 12/16/2008 2246   THCU NONE DETECTED 10/01/2013 1915   LABBARB NONE DETECTED 10/01/2013 1915    Alcohol Level:  No results found for this basename: ETH,  in the last 168 hours  CT of the  brain   10/08/2013 Interval increase in size of left temporoparietal intraparenchymal hematoma, was 2.2 x 3.1 cm, now 2.9 x 3.5 cm concerning for rebleed/ propagation of hemorrhagic stroke. Local mass effect without midline shift. No acute large vascular territory.  10/05/2013 Contracting left temporoparietal intraparenchymal hematoma was 2.7 x 3.8 cm, now 2.2 x 3.1 cm without rebleed. 10/03/2013 Contracting left temporal parietal intraparenchymal hematoma, no rebleed. Moderate white matter changes suggest chronic small vessel ischemic disease. 10/02/2013   Limited portable CT: Skullbase and posterior fossa not been included.  Evolving left temporoparietal intraparenchymal hematoma with similar mass effect. No new hemorrhage.   10/01/2013    1. 4 x 3 cm left temporal/parietal parenchymal hematoma. 2. 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 3. Diffuse inflammatory paranasal sinus disease.     MRI/MRA head pacemaker  2D Echo There is a mass present in the right atrium and right ventricle. It is not seen in the IVC. It was not present on the study of May, 2014. Considering all factors, the most likely etiology is clot. The mass is seen in the RA. There is also mass that moves across the tricuspid valve. There is probably also mass in the RV cavity. I can not be sure, but all of these targets might be connected.  Carotid Doppler (on hold d/t IJ)  LE Venous Dopplers There is no evidence of DVT or SVT in the bilateral lower extremities.  CT angio chest 1. Right lower lobar pulmonary embolus. 2. Left upper lobe on bilateral lower lobe airspace disease. While this likely represents atelectasis, early infection is not excluded. 3. Mild cardiomegaly and atherosclerotic disease. 4. Support apparatus as described.  CXR   10/10/2013 1. Line and tube position stable. 2. Severe stable cardiomegaly. 3. Slight clearing of mild bibasilar atelectasis and/or infiltrates. 10/09/2013 Cardiomegaly without  edema. No change in bibasilar atelectasis. Left IJ catheter has backed out slightly with the tip now just in  the superior vena cava. 10/07/2013 Enlargement of cardiac silhouette. Mild bibasilar atelectasis with improved aeration at right base versus previous study. 10/07/2013 Suboptimal exam due to positioning. Persistent low lung volumes and right lower lung opacity. 10/06/2013 Increased right base atelectasis and effusion. 10/05/2013 Stable cardiomegaly, bibasilar airspace opacities with improved  aeration of left lung base. Probable right middle lobe collapse. Small residual pleural effusions.  10/04/2013 Decreased right basilar atelectasis. Otherwise no significant  change. 10/03/2013 Fairly small right pleural effusion  with right base atelectasis. Lungs are otherwise clear. Tube and catheter positions as described without pneumothorax. 10/02/2013    Endotracheal tube 4 cm above the carina. Left central line tip projected laterally towards the lateral wall of the upper SVC. No pneumothorax.  Cardiomegaly with vascular congestion.  Small effusions and bibasilar atelectasis.    10/01/2013   The appearance of the pulmonary interstitium suggests mild interstitial edema likely secondary to CHF. There is no evidence of pneumonia or pleural effusion.     EKG  Sinus or ectopic atrial rhythm. Prolonged PR interval. Anterior infarct, old. No significant change was found.   Therapy Recommendations   Physical Exam General: The patient is obese. He is intubated. Cardiovascular: Distant heart sounds, regular rate. Abdomen: Obese, nontender, bowel sounds present but diminished. Skin: No significant peripheral edema is noted. 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  Neurologic Exam Mental status: The patient is intubated and sedated. Aphasic and will not follow commands. Cranial nerves: Opens eyes partially to auditory stimuli. Facial symmetry is present. The patient is intubated, sedated. Eyes are deviated to the  left, no blink to threat. Some facial grimace is noted with pain stimulation. The patient will open his eyes with stimulation. Motor: The patient has withdrawal movements on all 4 extremities. Sensory examination: The patient will respond to deep pain stimulation with withdrawal on all 4 extremities. Coordination: Cerebellar testing cannot be performed. Gait and station: The patient is on bedrest, the patient could not be ambulated. Reflexes: Deep tendon reflexes are symmetric, but are depressed.   ASSESSMENT [name] is a 68 y.o. male presenting with altered speech. Imaging confirms a left temporal parietal IPH with significant surrounding edema - no hydrocephalus, no [name]. Suspect underlying mass or ischemic infarct given amt of edema, possible vegetation. Unable to get MRI to confirm due to pacer. Slight increase in hemmorrhage size 10/08/2013. Has respiratory insufficiency due to PE from mobile mass (clot vs vegetation) found last week on pacer wire in RA extending into RV. Not an anticoagulation candidate for treatment due to [place]ol[place] and continued increase in size.  Placed on empiric abs for possible endocarditis/RA vegetation on wire - abx to stop 2/26. On aspirin 81 mg orally every day prior to admission. Patient purposeful, localizes; global aphasia, right hemirparesis, dysphagia.  Respiratory failure, intubated  due to respiratory insufficiency, low sats after admission. Extubated/reintubated 2/18 am due to pulmonary edema vs aspiration pneumonia. Started on vanc and zosyn. Suspected OSA/OHS. Unable to wean yesterday. RLL pulmonary embolus, not a anticoagulation candidate due to hemorrhage. PE from mobile mass.  obstructive sleep apnea  atrial fibrillation, not a coumadin candidate secondary to hemorrhage. Not on prior to admission due to hx rectal bleeding. hypertension Hyperlipidemia, on crestor 40 mg daily PTA, now on no statin as NPO  Pacemaker. intermittant CHB with appropriate  PPM function.  CAD  Non-ischemic cardiomyopathy  Diabetes, uncontrolled, HgbA1c 8.2, goal 7.0 Obesity, Body mass index is 37.31 kg/(m^2).   Legally blind  Herpes  Chronic kidney disease, stage 3  Hypokalemia, K 3.3  Hospital day # 9  TREATMENT/PLAN  Continue ICU level care  Trach and PEG recommended, Dr. Leonie Man discussed with daughter, who is agreeable. Dr. Leonie Man discussed with Dr. Nelda Marseille who will arrange.  Reconsider carotid doppler once IJ is removed  Not a candidate for antiplatelet due to increasing size of hemorrhage. Will reconsider antiplatelet in 1 week. Check CT prior to initiating  Likely SNF at time of discharge  Dr. Leonie Man discussed  diagnosis, prognosis,  treatment options and plan of care in detial with daughter..    This patient is critically ill and at significant risk of neurological worsening, death and care requires constant monitoring of vital signs, hemodynamics,respiratory and cardiac monitoring,review of multiple databases, neurological assessment, discussion with family, other specialists and medical decision making of high complexity. I spent 30 minutes of neurocritical care time  in the care of  this patient.  Burnetta Sabin, MSN, RN, ANVP-BC, ANP-BC, [facility] Pager: [phone] 10/10/2013 7:41 AM  I have personally obtained a history, examined the patient, evaluated imaging results, and formulated the assessment and plan of care. I agree with the above. Antony Contras, MD

## 2013-10-10 NOTE — Progress Notes (Signed)
Inpatient Diabetes Program Recommendations  AACE/ADA: New Consensus Statement on Inpatient Glycemic Control (2013)  Target Ranges:  Prepandial:   less than 140 mg/dL      Peak postprandial:   less than 180 mg/dL (1-2 hours)      Critically ill patients:  140 - 180 mg/dL   Reason for Visit: Results for GEHRIG, SCHIESS (MRN [id number]) as of 10/10/2013 15:45  Ref. Range 10/09/2013 19:50 10/09/2013 23:50 10/10/2013 03:52 10/10/2013 07:53 10/10/2013 12:13  Glucose-Capillary Latest Range: 70-99 mg/dL 151 (H) 173 (H) 171 (H) 191 (H) 221 (H)    CBG's trending up.  Consider restarting home dose of Lantus 8 units daily.    Thanks, Adah Perl, RN, BC-ADM Inpatient Diabetes Coordinator Pager [phone]

## 2013-10-11 ENCOUNTER — Inpatient Hospital Stay (HOSPITAL_COMMUNITY): Payer: Medicare Other

## 2013-10-11 LAB — CBC
HCT: 35.3 % — ABNORMAL LOW (ref 39.0–52.0)
HEMOGLOBIN: 11 g/dL — AB (ref 13.0–17.0)
MCH: 25.7 pg — ABNORMAL LOW (ref 26.0–34.0)
MCHC: 31.2 g/dL (ref 30.0–36.0)
MCV: 82.5 fL (ref 78.0–100.0)
Platelets: 210 10*3/uL (ref 150–400)
RBC: 4.28 MIL/uL (ref 4.22–5.81)
RDW: 16.2 % — ABNORMAL HIGH (ref 11.5–15.5)
WBC: 11.3 10*3/uL — ABNORMAL HIGH (ref 4.0–10.5)

## 2013-10-11 LAB — BLOOD GAS, ARTERIAL
Acid-Base Excess: 2.3 mmol/L — ABNORMAL HIGH (ref 0.0–2.0)
Bicarbonate: 26.1 mEq/L — ABNORMAL HIGH (ref 20.0–24.0)
Drawn by: 39898
FIO2: 0.3 %
LHR: 16 {breaths}/min
O2 Saturation: 97.1 %
PEEP: 5 cmH2O
PH ART: 7.447 (ref 7.350–7.450)
PO2 ART: 91.4 mmHg (ref 80.0–100.0)
Patient temperature: 98.6
TCO2: 27.3 mmol/L (ref 0–100)
VT: 670 mL
pCO2 arterial: 38.4 mmHg (ref 35.0–45.0)

## 2013-10-11 LAB — GLUCOSE, CAPILLARY
GLUCOSE-CAPILLARY: 191 mg/dL — AB (ref 70–99)
GLUCOSE-CAPILLARY: 209 mg/dL — AB (ref 70–99)
GLUCOSE-CAPILLARY: 215 mg/dL — AB (ref 70–99)
Glucose-Capillary: 181 mg/dL — ABNORMAL HIGH (ref 70–99)
Glucose-Capillary: 190 mg/dL — ABNORMAL HIGH (ref 70–99)
Glucose-Capillary: 198 mg/dL — ABNORMAL HIGH (ref 70–99)
Glucose-Capillary: 200 mg/dL — ABNORMAL HIGH (ref 70–99)

## 2013-10-11 LAB — BASIC METABOLIC PANEL
BUN: 31 mg/dL — AB (ref 6–23)
CHLORIDE: 108 meq/L (ref 96–112)
CO2: 26 mEq/L (ref 19–32)
CREATININE: 1.04 mg/dL (ref 0.50–1.35)
Calcium: 9.4 mg/dL (ref 8.4–10.5)
GFR calc non Af Amer: 73 mL/min — ABNORMAL LOW (ref >90)
GFR, EST AFRICAN AMERICAN: 85 mL/min — AB (ref >90)
Glucose, Bld: 220 mg/dL — ABNORMAL HIGH (ref 70–99)
Potassium: 3.2 mEq/L — ABNORMAL LOW (ref 3.7–5.3)
Sodium: 148 mEq/L — ABNORMAL HIGH (ref 137–147)

## 2013-10-11 LAB — MAGNESIUM: Magnesium: 2.3 mg/dL (ref 1.5–2.5)

## 2013-10-11 LAB — PHOSPHORUS: Phosphorus: 3.2 mg/dL (ref 2.3–4.6)

## 2013-10-11 LAB — TRIGLYCERIDES: TRIGLYCERIDES: 132 mg/dL (ref <150)

## 2013-10-11 MED ORDER — POTASSIUM CHLORIDE 20 MEQ/15ML (10%) PO LIQD
40.0000 meq | Freq: Three times a day (TID) | ORAL | Status: AC
  Administered 2013-10-11 (×3): 40 meq
  Filled 2013-10-11 (×3): qty 30

## 2013-10-11 MED ORDER — FREE WATER
200.0000 mL | Freq: Four times a day (QID) | Status: DC
  Administered 2013-10-11 – 2013-10-16 (×16): 200 mL

## 2013-10-11 MED ORDER — INSULIN ASPART 100 UNIT/ML ~~LOC~~ SOLN: 2.0000 [IU] | SUBCUTANEOUS | Status: DC | PRN

## 2013-10-11 MED ORDER — INSULIN ASPART 100 UNIT/ML ~~LOC~~ SOLN
0.0000 [IU] | SUBCUTANEOUS | Status: DC
  Administered 2013-10-11: 4 [IU] via SUBCUTANEOUS
  Administered 2013-10-11: 7 [IU] via SUBCUTANEOUS
  Administered 2013-10-11: 4 [IU] via SUBCUTANEOUS
  Administered 2013-10-12 (×3): 3 [IU] via SUBCUTANEOUS
  Administered 2013-10-12: 4 [IU] via SUBCUTANEOUS
  Administered 2013-10-12: 7 [IU] via SUBCUTANEOUS
  Administered 2013-10-12 – 2013-10-13 (×3): 3 [IU] via SUBCUTANEOUS
  Administered 2013-10-13: 7 [IU] via SUBCUTANEOUS
  Administered 2013-10-13: 4 [IU] via SUBCUTANEOUS
  Administered 2013-10-13: 3 [IU] via SUBCUTANEOUS
  Administered 2013-10-13: 7 [IU] via SUBCUTANEOUS
  Administered 2013-10-14 – 2013-10-15 (×7): 4 [IU] via SUBCUTANEOUS
  Administered 2013-10-15: 3 [IU] via SUBCUTANEOUS
  Administered 2013-10-15: 7 [IU] via SUBCUTANEOUS
  Administered 2013-10-15: 4 [IU] via SUBCUTANEOUS
  Administered 2013-10-15: 3 [IU] via SUBCUTANEOUS
  Administered 2013-10-15: 4 [IU] via SUBCUTANEOUS
  Administered 2013-10-15: 3 [IU] via SUBCUTANEOUS
  Administered 2013-10-16: 4 [IU] via SUBCUTANEOUS
  Administered 2013-10-16: 3 [IU] via SUBCUTANEOUS
  Administered 2013-10-16: 11 [IU] via SUBCUTANEOUS

## 2013-10-11 MED ORDER — FUROSEMIDE 10 MG/ML IJ SOLN
40.0000 mg | Freq: Three times a day (TID) | INTRAMUSCULAR | Status: AC
  Administered 2013-10-11 (×2): 40 mg via INTRAVENOUS
  Filled 2013-10-11 (×2): qty 4

## 2013-10-11 NOTE — Progress Notes (Signed)
Pt passed SBT however is currently weaning on CP/PS 5/10 due to no plans to extubate the pt. No complications noted. RT will monitor.

## 2013-10-11 NOTE — Progress Notes (Signed)
Placed back on full support per protocol

## 2013-10-11 NOTE — Progress Notes (Signed)
PULMONARY / CRITICAL CARE MEDICINE  Name: [name] MRN: [id number] DOB: 1946/02/13   LOS 10  days   ADMISSION DATE:  10/01/2013 CONSULTATION DATE:  10/02/2013  REFERRING MD :  Stroke PRIMARY SERVICE: Stroke  CHIEF COMPLAINT:  Acute respiratory failure   BRIEF PATIENT DESCRIPTION: 68 year old male with multiple co-morbids including suspected OSA/OHS. Admitted for left temporal / parietal ICH on 2/16. Developed decrease LOC and progressive respiratory failure early in the am on 2/17. PCCM asked to see emergently in consult.   SIGNIFICANT EVENTS / STUDIES:  2/16  Head CT >>> 1. 4 x 3 cm left temporal/parietal parenchymal hematoma. 2. 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 3. Diffuse inflammatory paranasal sinus disease. 2/17  Head CT >>>  Evolving left temporoparietal intraparenchymal hematoma with similar mass effect. No new hemorrhage. 2/18  Head CT >>>  Contracting left temporal parietal intraparenchymal hematoma, no rebleed. Moderate white matter changes suggest chronic small vessel ischemic disease. 2/18  Extubated / re-intubated due to pulmonary edema vs aspiration pneumonia vs other 2/19  TTE >>> mobile mass in RA on the pacer wire, likely clot (vs vegetation) 2/19  CT-PA >> R LL acute PE, LUL and BLL atx vs infiltrates 10/08/13 - NEURO: increase size of Left temp/parietal hemorrhage to 3.5cm without midline shift. Neuro advises holding off anticoagulation for one more week at the least  10/08/13: CARDS: Mural thrombus of heart: TEE confirms mass in the right side of the heart. This is a large mass consistent with clot on the pacemaker lead and prolapsing. No options for intervention at this point and he is not a anticoagulation candidate. No DVT. No indication for filter. 2/25 Spoke with daughter, ok to proceed with trach/peg, plan trach on Friday at 10 AM.  LINES / TUBES: L IJ CVL 2/17 >>> OETT 2/17 >>> 2/18; 2/18 >>> OGT 1/17 >>> 2/18; 2/18 >>> Foley  2/17 >>>  CULTURES: MRSA PCR 2/16 - negative Blood 2/18 >> ngtd  resp 2/19 >> oropharyngeal flora  ANTIBIOTICS: Zosyn 2/18 >>>2/22 Vancomycin 2/18 >>>2/22 ceftriaxone 2/22>>> (2/26)  SUBJECTIVE:  No events overnight, awake but not following commands.  VITAL SIGNS: Temp:  [98.2 F (36.8 C)-99 F (37.2 C)] 98.5 F (36.9 C) (02/26 0730) Pulse Rate:  [67-87] 70 (02/26 1000) Resp:  [15-23] 19 (02/26 1000) BP: (94-186)/(56-108) 108/64 mmHg (02/26 1000) SpO2:  [93 %-100 %] 97 % (02/26 1000) FiO2 (%):  [30 %] 30 % (02/26 1000) Weight:  [126.8 kg (279 lb 8.7 oz)] 126.8 kg (279 lb 8.7 oz) (02/26 0400)  HEMODYNAMICS:   VENTILATOR SETTINGS: Vent Mode:  [-] CPAP;PSV FiO2 (%):  [30 %] 30 % Set Rate:  [16 bmp] 16 bmp Vt Set:  [670 mL] 670 mL PEEP:  [5 cmH20] 5 cmH20 Pressure Support:  [10 cmH20] 10 cmH20 Plateau Pressure:  [17 cmH20-21 cmH20] 17 cmH20  INTAKE / OUTPUT: Intake/Output     02/25 0701 - 02/26 0700 02/26 0701 - 02/27 0700   I.V. (mL/kg) 703.5 (5.5) 67.7 (0.5)   NG/GT 1750 195   IV Piggyback 50    Total Intake(mL/kg) 2503.5 (19.7) 262.7 (2.1)   Urine (mL/kg/hr) 1395 (0.5) 185 (0.4)   Stool 350 (0.1)    Total Output 1745 185   Net +758.5 +77.7         PHYSICAL EXAMINATION: General:  Mechanically ventilated, synchronous, minimally responsive. Neuro: Eyes open, not following command. HEENT:  OETT / OGT intact, PERRL. Cardiovascular:  Regular, no m/g/r noted. Lungs:  Bilateral air entry, few rhonchi. Abdomen:  Obese, soft, nontender, nondistended +bs. Musculoskeletal:  Trace edema. Skin:  Intact.  LABS:  PULMONARY  Recent Labs Lab 10/11/13 0500  PHART 7.447  PCO2ART 38.4  PO2ART 91.4  HCO3 26.1*  TCO2 27.3  O2SAT 97.1    CBC  Recent Labs Lab 10/07/13 0500 10/08/13 0500 10/11/13 0500  HGB 10.8* 11.0* 11.0*  HCT 33.9* 35.5* 35.3*  WBC 7.4 8.3 11.3*  PLT 183 180 210   COAGULATION No results found for this basename: INR,  in the last 168  hours  CARDIAC  No results found for this basename: TROPONINI,  in the last 168 hours  Recent Labs Lab 10/09/13 0410  PROBNP 378.9*   CHEMISTRY  Recent Labs Lab 10/07/13 0145 10/07/13 0500 10/08/13 0500 10/09/13 0410 10/10/13 0525 10/11/13 0500  NA 139 139 146 141 146 148*  K 3.4* 3.3* 3.3* 3.3* 3.3* 3.2*  CL 103 104 107 103 109 108  CO2 26 25 27 26 25 26   GLUCOSE 158* 176* 185* 180* 211* 220*  BUN 26* 24* 33* 32* 35* 31*  CREATININE 1.01 0.95 1.13 1.09 1.11 1.04  CALCIUM 8.8 8.8 9.1 9.1 9.3 9.4  MG 2.2  --   --  2.1 2.2 2.3  PHOS  --   --   --  2.9 2.7 3.2   Estimated Creatinine Clearance: 97.5 ml/min (by C-G formula based on Cr of 1.04).  LIVER No results found for this basename: AST, ALT, ALKPHOS, BILITOT, PROT, ALBUMIN, INR,  in the last 168 hours INFECTIOUS  Recent Labs Lab 10/05/13 0500 10/06/13 0325 10/10/13 0525  LATICACIDVEN  --   --  1.2  PROCALCITON 0.19 0.12  --    ENDOCRINE CBG (last 3)   Recent Labs  10/10/13 2356 10/11/13 0359 10/11/13 0749  GLUCAP 200* 190* 191*   IMAGING x48h  Dg Chest Port 1 View  10/11/2013   CLINICAL DATA:  Intubation .  EXAM: PORTABLE CHEST - 1 VIEW  COMPARISON:  DG CHEST 1V PORT dated 10/10/2013  FINDINGS: Endotracheal tube 5.1 cm above the carina. NG tube tip below left hemidiaphragm. Left IJ catheter noted with tip projected over superior vena cava. Cardiomegaly with normal pulmonary vascularity. Cardiac pacer in stable position. Persistent mild bibasilar infiltrates and/or atelectasis remain. These are unchanged. No pneumothorax.  IMPRESSION: 1. Endotracheal tube tip 5.1 cm above the carina. Central line and NG tube in stable position. 2. Persistent bibasilar infiltrates and/or atelectasis.  No change.   Electronically Signed   By: Marcello Moores  Register   On: 10/11/2013 07:48   Dg Chest Port 1 View  10/10/2013   CLINICAL DATA:  Intubation.  EXAM: PORTABLE CHEST - 1 VIEW  COMPARISON:  DG CHEST 1V PORT dated 10/09/2013; DG  CHEST 1V PORT dated 10/07/2013  FINDINGS: Endotracheal tube and NG tube in stable position. Left IJ line in stable position. Stable cardiomegaly. Cardiomegaly is severe.Cardiac pacer is in stable position. Mild clearing of bibasilar atelectasis and/or infiltrates. No pleural effusion or pneumothorax.  IMPRESSION: 1. Line and tube position stable. 2. Severe stable cardiomegaly. 3. Slight clearing of mild bibasilar atelectasis and/or infiltrates.   Electronically Signed   By: Marcello Moores  Register   On: 10/10/2013 07:37   ASSESSMENT / PLAN:  PULMONARY A:  Acute respiratory failure, re intubated 2/18 due to PE from Right atrial lead clot Possible PNA vs atelectasis Acute RLL PE Suspected OSA/OHS DUplex LE negative for DVT 10/08/13 - Unable to meet extubation criteria due  to CNS issues  P:   - Continue PS trials but no extubation due to inability to protect airway. - Anticoagulation contraindicated at this time; Consider starting heparin gtt reassess 10/14/13 Clearly there are risks/benefits on each side.  - Hold off IVC filter for nw; SCDs. - Plan to trach on Friday at 11 AM.  CARDIOVASCULAR A:   HTN Chronic diastolic heart failure Transient hypotension after sedation / intubation, resolved H/o CAD, AF, non-ischemic cardiomyopathy, HTN R atrial mass, Mural thrombus- attached to pacer wire- Looking less likely this is endocarditis P:  - Per Cards. - Goal SBP <160. - Empiric abx for possibility of endocarditis/ RA vegetation; need to establish stop date. - Not a candicate for thrombectomy when he cannot be heparinized or direct t-PA. - Hold ASA. - Labetalol PRN.  RENAL A:   CKD stage 3 Hypokalemia Continued pos balance  P:   - Trend BMP. - Free water. - K replacement. - Potassium as ordered.  GASTROINTESTINAL A:   Obesity Mild elevated LFTs, resolved GERD on PPI preadmission Nutrition P:   - Maintain TF per Nutritionist. - Protonix.  HEMATOLOGIC A:   Mild anemia   dilution P:  - Trend CBC, hgb stable. - PRBC for hgb < 7gm%.  INFECTIOUS A:   Possible pneumonia vs atx, Pct is equivocal. TEE 10/08/13: Mural thrombus and  Not endocarditis  P:   - End ceftriaxone on 10/11/13.  ENDOCRINE A:   DM  P:   - SSI.  NEUROLOGIC A: Left temp/parietal ICH w associated edema Acute encephalopathy Worsening hemorrhage 10/08/13 imaging of brain  P:   - Appreciate stroke team assistance. - Goal RASS 0 to -1. - Propofol gtt; check lactic acid and CK 10/10/13. - Fentanyl PRN. - No anticoagulation atleast till 10/14/13 and then reassess.  GLOBAL Spoke with daughters, will trach in AM and maintain on TC for now.  The patient is critically ill with multiple organ systems failure and requires high complexity decision making for assessment and support, frequent evaluation and titration of therapies, application of advanced monitoring technologies and extensive interpretation of multiple databases.   Critical Care Time devoted to patient care services described in this note is  35  Minutes.  Rush Farmer, M.D. [facility] Pulmonary/Critical Care Medicine. Pager: [phone]. After hours pager: [phone].

## 2013-10-11 NOTE — Progress Notes (Signed)
UR completed.  Kabria Hetzer, RN BSN M[facility] CCM Trauma/Neuro ICU Case Manager [phone]  

## 2013-10-11 NOTE — Clinical Social Work Note (Signed)
Clinical Social Worker received notification that patient PACE benefit would likely not authorize LTACH placement.  CSW contacted PACE Darrelyn Hillock, RN) who states that the office is closed today but she will make an effort to reach administrator Bynum Bellows) to discuss issues.  Kennyth Lose states that usually in this situation, patient family must request the release of PACE benefit and it takes 30 days to transition away from PACE benefit - she was not 100% confident in that statement and states that administration will contact us back to discuss further details.  CSW/CM will follow up with patient daughter.  PACE administrator was given direct contact for CSW and CM.  Barbette Or, [place]

## 2013-10-11 NOTE — Progress Notes (Signed)
Patient ID: [name], male   DOB: Nov 21, 1945, 68 y.o.   MRN: [id number]    Subjective: Awake on vent  Objective: Vital signs in last 24 hours: Temp:  [98.2 F (36.8 C)-99 F (37.2 C)] 98.5 F (36.9 C) (02/26 0730) Pulse Rate:  [67-87] 70 (02/26 0800) Resp:  [15-23] 17 (02/26 0800) BP: (94-186)/(56-108) 134/68 mmHg (02/26 0800) SpO2:  [93 %-100 %] 99 % (02/26 0800) FiO2 (%):  [30 %] 30 % (02/26 0800) Weight:  [279 lb 8.7 oz (126.8 kg)] 279 lb 8.7 oz (126.8 kg) (02/26 0400) Last BM Date: 10/11/13  Intake/Output from previous day: 02/25 0701 - 02/26 0700 In: 2503.5 [I.V.:703.5; NG/GT:1750; IV Piggyback:50] Out: 1745 [Urine:1395; Stool:350] Intake/Output this shift: Total I/O In: 92.7 [I.V.:27.7; NG/GT:65] Out: 100 [Urine:100]  Resp: few rhonchi Cardio: S1, S2 normal GI: soft, NT, lower midline scar Neuro: follows some commands  Lab Results: CBC   Recent Labs  10/11/13 0500  WBC 11.3*  HGB 11.0*  HCT 35.3*  PLT 210   BMET  Recent Labs  10/10/13 0525 10/11/13 0500  NA 146 148*  K 3.3* 3.2*  CL 109 108  CO2 25 26  GLUCOSE 211* 220*  BUN 35* 31*  CREATININE 1.11 1.04  CALCIUM 9.3 9.4   PT/INR No results found for this basename: LABPROT, INR,  in the last 72 hours ABG  Recent Labs  10/11/13 0500  PHART 7.447  HCO3 26.1*    Studies/Results: Dg Chest Port 1 View  10/11/2013   CLINICAL DATA:  Intubation .  EXAM: PORTABLE CHEST - 1 VIEW  COMPARISON:  DG CHEST 1V PORT dated 10/10/2013  FINDINGS: Endotracheal tube 5.1 cm above the carina. NG tube tip below left hemidiaphragm. Left IJ catheter noted with tip projected over superior vena cava. Cardiomegaly with normal pulmonary vascularity. Cardiac pacer in stable position. Persistent mild bibasilar infiltrates and/or atelectasis remain. These are unchanged. No pneumothorax.  IMPRESSION: 1. Endotracheal tube tip 5.1 cm above the carina. Central line and NG tube in stable position. 2. Persistent bibasilar  infiltrates and/or atelectasis.  No change.   Electronically Signed   By: Marcello Moores  Register   On: 10/11/2013 07:48   Dg Chest Port 1 View  10/10/2013   CLINICAL DATA:  Intubation.  EXAM: PORTABLE CHEST - 1 VIEW  COMPARISON:  DG CHEST 1V PORT dated 10/09/2013; DG CHEST 1V PORT dated 10/07/2013  FINDINGS: Endotracheal tube and NG tube in stable position. Left IJ line in stable position. Stable cardiomegaly. Cardiomegaly is severe.Cardiac pacer is in stable position. Mild clearing of bibasilar atelectasis and/or infiltrates. No pleural effusion or pneumothorax.  IMPRESSION: 1. Line and tube position stable. 2. Severe stable cardiomegaly. 3. Slight clearing of mild bibasilar atelectasis and/or infiltrates.   Electronically Signed   By: Marcello Moores  Register   On: 10/10/2013 07:37    Anti-infectives: Anti-infectives   Start     Dose/Rate Route Frequency Ordered Stop   10/07/13 1300  cefTRIAXone (ROCEPHIN) 1 g in dextrose 5 % 50 mL IVPB     1 g 100 mL/hr over 30 Minutes Intravenous Every 24 hours 10/07/13 1115 10/11/13 2359   10/06/13 1300  vancomycin (VANCOCIN) IVPB 1000 mg/200 mL premix  Status:  Discontinued     1,000 mg 200 mL/hr over 60 Minutes Intravenous Every 12 hours 10/06/13 1049 10/07/13 1115   10/04/13 1000  vancomycin (VANCOCIN) 1,250 mg in sodium chloride 0.9 % 250 mL IVPB  Status:  Discontinued     1,250 mg 166.7  mL/hr over 90 Minutes Intravenous Every 12 hours 10/03/13 2056 10/06/13 1027   10/03/13 2100  piperacillin-tazobactam (ZOSYN) IVPB 3.375 g     3.375 g 100 mL/hr over 30 Minutes Intravenous  Once 10/03/13 2056 10/03/13 2159   10/03/13 2100  vancomycin (VANCOCIN) 2,500 mg in sodium chloride 0.9 % 500 mL IVPB     2,500 mg 250 mL/hr over 120 Minutes Intravenous  Once 10/03/13 2056 10/03/13 2345   10/03/13 0500  piperacillin-tazobactam (ZOSYN) IVPB 3.375 g  Status:  Discontinued     3.375 g 12.5 mL/hr over 240 Minutes Intravenous Every 8 hours 10/03/13 2056 10/07/13 1115       Assessment/Plan: Malnutrition and need for enteral access - for PEG 2/27 by Dr. Hulen Skains. Hold TF at [place]. Consent already obtained.  LOS: 10 days    Georganna Skeans, MD, MPH, FACS Pager: [phone]  10/11/2013

## 2013-10-11 NOTE — Progress Notes (Signed)
Stroke Team Progress Note  HISTORY [name] is an 68 y.o. male who lives with a roommate. Roommate felt that patient was not at his baseline today and this morning felt hat he was hallucinating. He went to his day program where they noticed that he was not talking right. EMS was called at that time. Last known well 09/30/2013 at 22:30. Patient was not administerd TPA secondary to delay in arrival. He was admitted to the neuro ICU for further evaluation and treatment.  SUBJECTIVE No family at bedside this am (big snow last night).   OBJECTIVE Most recent Vital Signs: Filed Vitals:   10/11/13 0630 10/11/13 0700 10/11/13 0730 10/11/13 0800  BP: 164/86 173/88 133/66 134/68  Pulse: 70 70 77 70  Temp:   98.5 F (36.9 C)   TempSrc:   Axillary   Resp: 16 15 16 17   Height:      Weight:      SpO2: 99% 99% 100% 99%   CBG (last 3)   Recent Labs  10/10/13 2013 10/10/13 2356 10/11/13 0359  GLUCAP 185* 200* 190*    IV Fluid Intake:   . sodium chloride 20 mL/hr at 10/11/13 0800  . sodium chloride    . feeding supplement (VITAL HIGH PROTEIN) 1,000 mL (10/11/13 0800)  . propofol 0 mcg/kg/min (10/11/13 0800)    MEDICATIONS  . amiodarone  200 mg Oral Daily  . antiseptic oral rinse  15 mL Mouth Rinse QID  . atorvastatin  10 mg Oral q1800  . brimonidine  1 drop Both Eyes TID  . carvedilol  25 mg Per Tube BID WC  . cefTRIAXone (ROCEPHIN)  IV  1 g Intravenous Q24H  . chlorhexidine  15 mL Mouth Rinse BID  . cloNIDine  0.1 mg Per Tube BID  . dorzolamide-timolol  1 drop Both Eyes BID  . enoxaparin (LOVENOX) injection  40 mg Subcutaneous Q24H  . feeding supplement (PRO-STAT SUGAR FREE 64)  30 mL Per Tube BID  . insulin aspart  0-15 Units Subcutaneous 6 times per day  . isosorbide dinitrate  10 mg Oral BID  . magnesium oxide  400 mg Oral Daily  . pantoprazole sodium  40 mg Per Tube QHS  . pneumococcal 23 valent vaccine  0.5 mL Intramuscular Tomorrow-1000  . polyethylene glycol  17 g  Oral Daily  . polyvinyl alcohol  1 drop Both Eyes BID  . senna-docusate  1 tablet Oral BID   PRN:  fentaNYL, hydrALAZINE, labetalol  Diet:  NPO Getting TF Activity:  As tolerated DVT Prophylaxis:  Lovenox 40 mg sq daily   CLINICALLY SIGNIFICANT STUDIES Basic Metabolic Panel:   Recent Labs Lab 10/10/13 0525 10/11/13 0500  NA 146 148*  K 3.3* 3.2*  CL 109 108  CO2 25 26  GLUCOSE 211* 220*  BUN 35* 31*  CREATININE 1.11 1.04  CALCIUM 9.3 9.4  MG 2.2 2.3  PHOS 2.7 3.2   Liver Function Tests:  No results found for this basename: AST, ALT, ALKPHOS, BILITOT, PROT, ALBUMIN,  in the last 168 hours CBC:   Recent Labs Lab 10/07/13 0500 10/08/13 0500 10/11/13 0500  WBC 7.4 8.3 11.3*  NEUTROABS 4.0  --   --   HGB 10.8* 11.0* 11.0*  HCT 33.9* 35.5* 35.3*  MCV 81.5 82.0 82.5  PLT 183 180 210   Coagulation:  No results found for this basename: LABPROT, INR,  in the last 168 hours Cardiac Enzymes:   Recent Labs Lab 10/09/13 [place]  45  CKMB <1.0   Urinalysis:  No results found for this basename: COLORURINE, APPERANCEUR, LABSPEC, PHURINE, GLUCOSEU, HGBUR, BILIRUBINUR, KETONESUR, PROTEINUR, UROBILINOGEN, NITRITE, LEUKOCYTESUR,  in the last 168 hours Lipid Panel    Component Value Date/Time   CHOL 134 10/05/2013 0500   TRIG 132 10/11/2013 0522   HDL 37* 10/05/2013 0500   CHOLHDL 3.6 10/05/2013 0500   VLDL 30 10/05/2013 0500   LDLCALC 67 10/05/2013 0500   HgbA1C  Lab Results  Component Value Date   HGBA1C 8.2* 10/02/2013    Urine Drug Screen:     Component Value Date/Time   LABOPIA NONE DETECTED 10/01/2013 1915   COCAINSCRNUR NONE DETECTED 10/01/2013 1915   COCAINSCRNUR NEG 12/16/2008 2246   LABBENZ NONE DETECTED 10/01/2013 1915   LABBENZ NEG 12/16/2008 2246   AMPHETMU NONE DETECTED 10/01/2013 1915   AMPHETMU NEG 12/16/2008 2246   THCU NONE DETECTED 10/01/2013 1915   LABBARB NONE DETECTED 10/01/2013 1915    Alcohol Level:  No results found for this basename: ETH,  in  the last 168 hours  CT of the brain   10/08/2013 Interval increase in size of left temporoparietal intraparenchymal hematoma, was 2.2 x 3.1 cm, now 2.9 x 3.5 cm concerning for rebleed/ propagation of hemorrhagic stroke. Local mass effect without midline shift. No acute large vascular territory.  10/05/2013 Contracting left temporoparietal intraparenchymal hematoma was 2.7 x 3.8 cm, now 2.2 x 3.1 cm without rebleed. 10/03/2013 Contracting left temporal parietal intraparenchymal hematoma, no rebleed. Moderate white matter changes suggest chronic small vessel ischemic disease. 10/02/2013   Limited portable CT: Skullbase and posterior fossa not been included.  Evolving left temporoparietal intraparenchymal hematoma with similar mass effect. No new hemorrhage.   10/01/2013    1. 4 x 3 cm left temporal/parietal parenchymal hematoma. 2. 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 3. Diffuse inflammatory paranasal sinus disease.     MRI/MRA head pacemaker  2D Echo There is a mass present in the right atrium and right ventricle. It is not seen in the IVC. It was not present on the study of May, 2014. Considering all factors, the most likely etiology is clot. The mass is seen in the RA. There is also mass that moves across the tricuspid valve. There is probably also mass in the RV cavity. I can not be sure, but all of these targets might be connected.  Carotid Doppler (on hold d/t IJ)  LE Venous Dopplers There is no evidence of DVT or SVT in the bilateral lower extremities.  CT angio chest 1. Right lower lobar pulmonary embolus. 2. Left upper lobe on bilateral lower lobe airspace disease. While this likely represents atelectasis, early infection is not excluded. 3. Mild cardiomegaly and atherosclerotic disease. 4. Support apparatus as described.  CXR   10/11/2013 1. Endotracheal tube tip 5.1 cm above the carina. Central line and NG tube in stable position. 2. Persistent bibasilar infiltrates  and/or atelectasis. No change.  10/10/2013 1. Line and tube position stable. 2. Severe stable cardiomegaly. 3. Slight clearing of mild bibasilar atelectasis and/or infiltrates. 10/09/2013 Cardiomegaly without edema. No change in bibasilar atelectasis. Left IJ catheter has backed out slightly with the tip now just in  the superior vena cava. 10/07/2013 Enlargement of cardiac silhouette. Mild bibasilar atelectasis with improved aeration at right base versus previous study. 10/07/2013 Suboptimal exam due to positioning. Persistent low lung volumes and right lower lung opacity. 10/06/2013 Increased right base atelectasis and effusion. 10/05/2013 Stable cardiomegaly, bibasilar airspace opacities with  improved  aeration of left lung base. Probable right middle lobe collapse. Small residual pleural effusions.  10/04/2013 Decreased right basilar atelectasis. Otherwise no significant  change. 10/03/2013 Fairly small right pleural effusion with right base atelectasis. Lungs are otherwise clear. Tube and catheter positions as described without pneumothorax. 10/02/2013    Endotracheal tube 4 cm above the carina. Left central line tip projected laterally towards the lateral wall of the upper SVC. No pneumothorax.  Cardiomegaly with vascular congestion.  Small effusions and bibasilar atelectasis.    10/01/2013   The appearance of the pulmonary interstitium suggests mild interstitial edema likely secondary to CHF. There is no evidence of pneumonia or pleural effusion.     EKG  Sinus or ectopic atrial rhythm. Prolonged PR interval. Anterior infarct, old. No significant change was found.   Therapy Recommendations   Physical Exam General: The patient is obese. He is intubated. Cardiovascular: Distant heart sounds, regular rate. Abdomen: Obese, nontender, bowel sounds present but diminished. Skin: No significant peripheral edema is noted. 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  Neurologic Exam Mental status: The patient is  intubated and sedated. Aphasic and will not follow commands. Cranial nerves: Opens eyes partially to auditory stimuli. Facial symmetry is present. The patient is intubated, sedated. Eyes are deviated to the left, no blink to threat. Some facial grimace is noted with pain stimulation. The patient will open his eyes with stimulation. Motor: The patient has withdrawal movements on all 4 extremities. Sensory examination: The patient will respond to deep pain stimulation with withdrawal on all 4 extremities. Coordination: Cerebellar testing cannot be performed. Gait and station: The patient is on bedrest, the patient could not be ambulated. Reflexes: Deep tendon reflexes are symmetric, but are depressed.   ASSESSMENT [name] is a 68 y.o. male presenting with altered speech. Imaging confirms a left temporal parietal IPH with significant surrounding edema - no hydrocephalus, no [name]. Suspect underlying mass or ischemic infarct given amt of edema, possible vegetation. Unable to get MRI to confirm due to pacer. Slight increase in hemmorrhage size 10/08/2013. Has respiratory insufficiency due to PE from mobile mass (clot vs vegetation) found last week on pacer wire in RA extending into RV. Not an anticoagulation candidate for treatment due to [place].  Placed on empiric abs for possible endocarditis/RA vegetation on wire - abx to stop 2/26. On aspirin 81 mg orally every day prior to admission. Patient purposeful, localizes; global aphasia, right hemirparesis, dysphagia.  Respiratory failure, intubated  due to respiratory insufficiency, low sats after admission. Extubated/reintubated 2/18 am due to pulmonary edema vs aspiration pneumonia. Started on vanc and zosyn. Suspected OSA/OHS. Unable to wean yesterday. RLL pulmonary embolus, not a anticoagulation candidate due to hemorrhage. PE from mobile mass.  obstructive sleep apnea  atrial fibrillation, not a coumadin candidate secondary to hemorrhage. Not on  prior to admission due to hx rectal bleeding. hypertension Hyperlipidemia, on crestor 40 mg daily PTA, now on no statin as NPO  Pacemaker. intermittant CHB with appropriate PPM function.  CAD  Non-ischemic cardiomyopathy  Diabetes, uncontrolled, HgbA1c 8.2, goal 7.0 Obesity, Body mass index is 37.9 kg/(m^2).   Legally blind  Herpes  Chronic kidney disease, stage 3  Hypokalemia, K 3.3  Hospital day # 10  TREATMENT/PLAN  Continue ICU level care  Trach planned for Friday, followed by a PEG   Reconsider carotid doppler once IJ is removed  Consider antiplatelet post trach/PEG, aspirin 81 mg daily,  Check CT in am prior to initiating  LTACH consult to see if he is a candidate; if not, likely SNF at time of discharge  Dr. Leonie Man discussed diagnosis, prognosis,  treatment options and plan of care in detial with daughter..    This patient is critically ill and at significant risk of neurological worsening, death and care requires constant monitoring of vital signs, hemodynamics,respiratory and cardiac monitoring,review of multiple databases, neurological assessment, discussion with family, other specialists and medical decision making of high complexity. I spent 30 minutes of neurocritical care time  in the care of  this patient.  Burnetta Sabin, MSN, RN, ANVP-BC, ANP-BC, [facility] Pager: [phone] 10/11/2013 8:57 AM  I have personally obtained a history, examined the patient, evaluated imaging results, and formulated the assessment and plan of care. I agree with the above.  Antony Contras, MD

## 2013-10-12 ENCOUNTER — Inpatient Hospital Stay (HOSPITAL_COMMUNITY): Payer: Medicare Other

## 2013-10-12 ENCOUNTER — Encounter (HOSPITAL_COMMUNITY)
Admission: EM | Disposition: A | Discharge status: Nursing Home (Includes ICF) | Payer: Self-pay | Source: Home / Self Care | Attending: Neurology

## 2013-10-12 ENCOUNTER — Encounter (HOSPITAL_COMMUNITY): Payer: Self-pay | Admitting: Radiology

## 2013-10-12 DIAGNOSIS — E46 Unspecified protein-calorie malnutrition: Secondary | ICD-10-CM

## 2013-10-12 HISTORY — PX: ESOPHAGOGASTRODUODENOSCOPY: SHX5428

## 2013-10-12 HISTORY — PX: PEG PLACEMENT: SHX5437

## 2013-10-12 LAB — CBC
HCT: 34.1 % — ABNORMAL LOW (ref 39.0–52.0)
Hemoglobin: 10.6 g/dL — ABNORMAL LOW (ref 13.0–17.0)
MCH: 25.8 pg — AB (ref 26.0–34.0)
MCHC: 31.1 g/dL (ref 30.0–36.0)
MCV: 83 fL (ref 78.0–100.0)
Platelets: 228 10*3/uL (ref 150–400)
RBC: 4.11 MIL/uL — ABNORMAL LOW (ref 4.22–5.81)
RDW: 16.4 % — ABNORMAL HIGH (ref 11.5–15.5)
WBC: 12.3 10*3/uL — ABNORMAL HIGH (ref 4.0–10.5)

## 2013-10-12 LAB — POCT I-STAT 3, ART BLOOD GAS (G3+)
ACID-BASE EXCESS: 4 mmol/L — AB (ref 0.0–2.0)
Bicarbonate: 27.3 mEq/L — ABNORMAL HIGH (ref 20.0–24.0)
O2 Saturation: 97 %
TCO2: 28 mmol/L (ref 0–100)
pCO2 arterial: 34.2 mmHg — ABNORMAL LOW (ref 35.0–45.0)
pH, Arterial: 7.51 — ABNORMAL HIGH (ref 7.350–7.450)
pO2, Arterial: 86 mmHg (ref 80.0–100.0)

## 2013-10-12 LAB — GLUCOSE, CAPILLARY
GLUCOSE-CAPILLARY: 139 mg/dL — AB (ref 70–99)
Glucose-Capillary: 155 mg/dL — ABNORMAL HIGH (ref 70–99)
Glucose-Capillary: 142 mg/dL — ABNORMAL HIGH (ref 70–99)
Glucose-Capillary: 148 mg/dL — ABNORMAL HIGH (ref 70–99)
Glucose-Capillary: 146 mg/dL — ABNORMAL HIGH (ref 70–99)
Glucose-Capillary: 134 mg/dL — ABNORMAL HIGH (ref 70–99)

## 2013-10-12 LAB — PHOSPHORUS: Phosphorus: 3.5 mg/dL (ref 2.3–4.6)

## 2013-10-12 LAB — BASIC METABOLIC PANEL
BUN: 30 mg/dL — AB (ref 6–23)
CO2: 28 mEq/L (ref 19–32)
Calcium: 9.5 mg/dL (ref 8.4–10.5)
Chloride: 109 mEq/L (ref 96–112)
Creatinine, Ser: 0.98 mg/dL (ref 0.50–1.35)
GFR calc Af Amer: >90 mL/min (ref >90)
GFR, EST NON AFRICAN AMERICAN: 84 mL/min — AB (ref >90)
Glucose, Bld: 144 mg/dL — ABNORMAL HIGH (ref 70–99)
POTASSIUM: 4 meq/L (ref 3.7–5.3)
Sodium: 148 mEq/L — ABNORMAL HIGH (ref 137–147)

## 2013-10-12 LAB — MAGNESIUM: Magnesium: 2.2 mg/dL (ref 1.5–2.5)

## 2013-10-12 SURGERY — EGD (ESOPHAGOGASTRODUODENOSCOPY)
Anesthesia: Moderate Sedation

## 2013-10-12 MED ORDER — MIDAZOLAM HCL 2 MG/2ML IJ SOLN
INTRAMUSCULAR | Status: AC
  Administered 2013-10-12: 2 mg
  Filled 2013-10-12: qty 2

## 2013-10-12 MED ORDER — ETOMIDATE 2 MG/ML IV SOLN
INTRAVENOUS | Status: AC
  Administered 2013-10-12: 10 mg
  Filled 2013-10-12: qty 10

## 2013-10-12 MED ORDER — FENTANYL CITRATE 0.05 MG/ML IJ SOLN
INTRAMUSCULAR | Status: AC
  Administered 2013-10-12: 100 ug
  Filled 2013-10-12: qty 4

## 2013-10-12 MED ORDER — MIDAZOLAM HCL 2 MG/2ML IJ SOLN
INTRAMUSCULAR | Status: AC
  Administered 2013-10-12: 2 mg
  Filled 2013-10-12: qty 4

## 2013-10-12 MED ORDER — VECURONIUM BROMIDE 10 MG IV SOLR
10.0000 mg | Freq: Once | INTRAVENOUS | Status: AC
  Administered 2013-10-12: 10 mg via INTRAVENOUS
  Filled 2013-10-12: qty 10

## 2013-10-12 NOTE — Progress Notes (Signed)
PULMONARY / CRITICAL CARE MEDICINE  Name: Kingslee Zales MRN: [id number] DOB: 12/07/1945   LOS 11  days   ADMISSION DATE:  10/01/2013 CONSULTATION DATE:  10/02/2013  REFERRING MD :  Stroke PRIMARY SERVICE: Stroke  CHIEF COMPLAINT:  Acute respiratory failure   BRIEF PATIENT DESCRIPTION: 68 year old male with multiple co-morbids including suspected OSA/OHS. Admitted for left temporal / parietal ICH on 2/16. Developed decrease LOC and progressive respiratory failure early in the am on 2/17. PCCM asked to see emergently in consult.   SIGNIFICANT EVENTS / STUDIES:  2/16  Head CT >>> 1. 4 x 3 cm left temporal/parietal parenchymal hematoma. 2. 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 3. Diffuse inflammatory paranasal sinus disease. 2/17  Head CT >>>  Evolving left temporoparietal intraparenchymal hematoma with similar mass effect. No new hemorrhage. 2/18  Head CT >>>  Contracting left temporal parietal intraparenchymal hematoma, no rebleed. Moderate white matter changes suggest chronic small vessel ischemic disease. 2/18  Extubated / re-intubated due to pulmonary edema vs aspiration pneumonia vs other 2/19  TTE >>> mobile mass in RA on the pacer wire, likely clot (vs vegetation) 2/19  CT-PA >> R LL acute PE, LUL and BLL atx vs infiltrates 10/08/13 - NEURO: increase size of Left temp/parietal hemorrhage to 3.5cm without midline shift. Neuro advises holding off anticoagulation for one more week at the least  10/08/13: CARDS: Mural thrombus of heart: TEE confirms mass in the right side of the heart. This is a large mass consistent with clot on the pacemaker lead and prolapsing. No options for intervention at this point and he is not a anticoagulation candidate. No DVT. No indication for filter. 2/25 Spoke with daughter, ok to proceed with trach/peg, plan trach on Friday at 72 AM. 2/27 Spoke with Dr. Leonie Man, does not wish to start any anticoagulation short of DVT prophylaxis  lovenox.  LINES / TUBES: L IJ CVL 2/17 >>> OETT 2/17 >>> 2/18; 2/18 >>>2/27 Trach (JY) 2/27>>> OGT 1/17 >>> 2/18; 2/18 >>> Foley 2/17 >>>  CULTURES: MRSA PCR 2/16 - negative Blood 2/18 >> ngtd  resp 2/19 >> oropharyngeal flora  ANTIBIOTICS: Zosyn 2/18 >>>2/22 Vancomycin 2/18 >>>2/22 ceftriaxone 2/22>>> 2/26  SUBJECTIVE:  No events overnight, arousable but not following commands.  VITAL SIGNS: Temp:  [98 F (36.7 C)-99 F (37.2 C)] 98.5 F (36.9 C) (02/27 0723) Pulse Rate:  [69-77] 70 (02/27 0800) Resp:  [14-22] 15 (02/27 0800) BP: (92-156)/(50-101) 127/82 mmHg (02/27 0800) SpO2:  [96 %-100 %] 98 % (02/27 0800) FiO2 (%):  [30 %] 30 % (02/27 0723) Weight:  [126 kg (277 lb 12.5 oz)] 126 kg (277 lb 12.5 oz) (02/27 0600)  HEMODYNAMICS:   VENTILATOR SETTINGS: Vent Mode:  [-] PRVC FiO2 (%):  [30 %] 30 % Set Rate:  [16 bmp] 16 bmp Vt Set:  [670 mL] 670 mL PEEP:  [5 cmH20] 5 cmH20 Pressure Support:  [10 cmH20] 10 cmH20 Plateau Pressure:  [18 cmH20-22 cmH20] 18 cmH20  INTAKE / OUTPUT: Intake/Output     02/26 0701 - 02/27 0700 02/27 0701 - 02/28 0700   I.V. (mL/kg) 687.3 (5.5) 86.2 (0.7)   NG/GT 1640    IV Piggyback 50    Total Intake(mL/kg) 2377.3 (18.9) 86.2 (0.7)   Urine (mL/kg/hr) 2710 (0.9)    Stool     Total Output 2710     Net -332.7 +86.2        Stool Occurrence  1 x    PHYSICAL EXAMINATION: General:  Mechanically ventilated, synchronous, minimally responsive. Neuro: Eyes open, not following command. HEENT:  OETT / OGT intact, PERRL. Cardiovascular:  Regular, no m/g/r noted. Lungs:  Bilateral air entry, few rhonchi. Abdomen:  Obese, soft, nontender, nondistended +bs. Musculoskeletal:  Trace edema. Skin:  Intact.  LABS:  PULMONARY  Recent Labs Lab 10/11/13 0500 10/12/13 0421  PHART 7.447 7.510*  PCO2ART 38.4 34.2*  PO2ART 91.4 86.0  HCO3 26.1* 27.3*  TCO2 27.3 28  O2SAT 97.1 97.0    CBC  Recent Labs Lab 10/08/13 0500 10/11/13 0500  10/12/13 0640  HGB 11.0* 11.0* 10.6*  HCT 35.5* 35.3* 34.1*  WBC 8.3 11.3* 12.3*  PLT 180 210 228   COAGULATION No results found for this basename: INR,  in the last 168 hours  CARDIAC  No results found for this basename: TROPONINI,  in the last 168 hours  Recent Labs Lab 10/09/13 0410  PROBNP 378.9*   CHEMISTRY  Recent Labs Lab 10/07/13 0145  10/08/13 0500 10/09/13 0410 10/10/13 0525 10/11/13 0500 10/12/13 0640  NA 139  < > 146 141 146 148* 148*  K 3.4*  < > 3.3* 3.3* 3.3* 3.2* 4.0  CL 103  < > 107 103 109 108 109  CO2 26  < > 27 26 25 26 28   GLUCOSE 158*  < > 185* 180* 211* 220* 144*  BUN 26*  < > 33* 32* 35* 31* 30*  CREATININE 1.01  < > 1.13 1.09 1.11 1.04 0.98  CALCIUM 8.8  < > 9.1 9.1 9.3 9.4 9.5  MG 2.2  --   --  2.1 2.2 2.3 2.2  PHOS  --   --   --  2.9 2.7 3.2 3.5  < > = values in this interval not displayed. Estimated Creatinine Clearance: 103.1 ml/min (by C-G formula based on Cr of 0.98).  LIVER No results found for this basename: AST, ALT, ALKPHOS, BILITOT, PROT, ALBUMIN, INR,  in the last 168 hours INFECTIOUS  Recent Labs Lab 10/06/13 0325 10/10/13 0525  LATICACIDVEN  --  1.2  PROCALCITON 0.12  --    ENDOCRINE CBG (last 3)   Recent Labs  10/11/13 2355 10/12/13 0318 10/12/13 0744  GLUCAP 209* 155* 142*   IMAGING x48h  Ct Head Wo Contrast  10/12/2013   CLINICAL DATA:  Followup intracranial hemorrhage  EXAM: CT HEAD WITHOUT CONTRAST  TECHNIQUE: Contiguous axial images were obtained from the base of the skull through the vertex without intravenous contrast.  COMPARISON:  Prior CT from 10/08/2013.  FINDINGS: Left temporoparietal hemorrhage is slightly decreased in size measuring 3.4 x 2.6 cm, previously 2.9 x 3.5 cm. Associated vasogenic edema and sulcal effacement is not significantly changed. No midline shift.  Cerebral volume within normal limits for age. Scattered and confluent supratentorial white matter hypodensity is again seen,  compatible with chronic small vessel ischemic changes. No acute large vessel territory infarct. No new intracranial hemorrhage identified.  No mass lesion or midline shift. Ventricles are stable in size without evidence of hydrocephalus. No extra-axial fluid collection. Dolichoectasia of the basilar artery again noted. Prominent atherosclerotic calcifications present within the vertebral arteries, the ectatic basilar artery, and the carotid siphons.  Chronic paranasal sinusitis again noted, slightly improved relative to prior. Minimal soft tissue density again noted within the left mastoid air cells. Orbits are unchanged.  Calvarium remains intact.  IMPRESSION: 1. Overall slight interval decrease in size of left temporoparietal intraparenchymal hemorrhage now measuring 3.4 x 2.6 cm, previously 3.5 x 2.9 cm. Similar  localized mass effect without midline shift. 2. No new intracranial hemorrhage or large vessel territory infarct. 3. Slightly improved paranasal sinus disease.   Electronically Signed   By: Jeannine Boga M.D.   On: 10/12/2013 05:48   Dg Chest Port 1 View  10/12/2013   CLINICAL DATA:  Evaluate endotracheal tube.  EXAM: PORTABLE CHEST - 1 VIEW  COMPARISON:  10/11/2013  FINDINGS: Endotracheal tube is roughly 4.6 cm above the carina. Again noted are low lung volumes. Left basilar densities are most compatible with atelectasis. Heart size remains enlarged. Stable position of the right dual lead cardiac pacemaker. The left jugular central venous catheter tip is now horizontally oriented in the upper SVC region. Nasogastric tube extends into the abdomen.  IMPRESSION: Low lung volumes with left basilar atelectasis.  Support apparatuses as described.   Electronically Signed   By: Markus Daft M.D.   On: 10/12/2013 07:39   Dg Chest Port 1 View  10/11/2013   CLINICAL DATA:  Intubation .  EXAM: PORTABLE CHEST - 1 VIEW  COMPARISON:  DG CHEST 1V PORT dated 10/10/2013  FINDINGS: Endotracheal tube 5.1 cm above  the carina. NG tube tip below left hemidiaphragm. Left IJ catheter noted with tip projected over superior vena cava. Cardiomegaly with normal pulmonary vascularity. Cardiac pacer in stable position. Persistent mild bibasilar infiltrates and/or atelectasis remain. These are unchanged. No pneumothorax.  IMPRESSION: 1. Endotracheal tube tip 5.1 cm above the carina. Central line and NG tube in stable position. 2. Persistent bibasilar infiltrates and/or atelectasis.  No change.   Electronically Signed   By: Marcello Moores  Register   On: 10/11/2013 07:48   ASSESSMENT / PLAN:  PULMONARY A:  Acute respiratory failure, re intubated 2/18 due to PE from Right atrial lead clot Possible PNA vs atelectasis Acute RLL PE Suspected OSA/OHS DUplex LE negative for DVT 10/08/13 Trach 2/27  P:   - Hold PS trials for tracheostomy today. - No full anti-coag given ICH, neuro ok with lovenox.  - Continue SCDs but no IVC filter. - Post trach will progress quickly to TC.  CARDIOVASCULAR A:   HTN Chronic diastolic heart failure Transient hypotension after sedation / intubation, resolved H/o CAD, AF, non-ischemic cardiomyopathy, HTN R atrial mass, Mural thrombus- attached to pacer wire- Looking less likely this is endocarditis P:  - Per Cards. - Goal SBP <160 - at goal. - Empiric abx for possibility of endocarditis/RA vegetation; stopped, last dose is 2/26. - Not a candicate for thrombectomy when he cannot be heparinized or direct t-PA. - Hold ASA. - Labetalol PRN.  RENAL A:   CKD stage 3 Hypokalemia Continued pos balance  P:   - Trend BMP. - Free water as ordered. - K replacement as needed.  GASTROINTESTINAL A:   Obesity Mild elevated LFTs, resolved GERD on PPI preadmission Nutrition P:   - Hold TF for PEG today. - Protonix.  HEMATOLOGIC A:   Mild anemia  dilution P:  - Trend CBC, hgb stable. - PRBC for hgb < 7gm%.  INFECTIOUS A:   Possible pneumonia vs atx, Pct is equivocal. TEE  10/08/13: Mural thrombus and  Not endocarditis  P:   - End ceftriaxone on 10/11/13.  ENDOCRINE A:   DM  P:   - SSI.  NEUROLOGIC A: Left temp/parietal ICH w associated edema Acute encephalopathy Worsening hemorrhage 10/08/13 imaging of brain  P:   - Appreciate stroke team assistance. - Goal RASS 0 to -1. - Will d/c all sedation post trach today. -  Fentanyl PRN. - No anticoagulation atleast till 10/14/13 and then reassess.  GLOBAL Trach/peg today, hopefully to TC relatively quickly and placement.  The patient is critically ill with multiple organ systems failure and requires high complexity decision making for assessment and support, frequent evaluation and titration of therapies, application of advanced monitoring technologies and extensive interpretation of multiple databases.   Critical Care Time devoted to patient care services described in this note is  35  Minutes.  Rush Farmer, M.D. [facility] Pulmonary/Critical Care Medicine. Pager: [phone]. After hours pager: [phone].

## 2013-10-12 NOTE — Progress Notes (Signed)
No SBT/wean done due to pt getting trach this AM. Pt is tolerating full support well with no complications noted. RT will monitor.

## 2013-10-12 NOTE — Progress Notes (Signed)
Orthopedic Tech Progress Note Patient Details:  [name] [birth year] [id number]  Ortho Devices Type of Ortho Device: Abdominal binder Ortho Device/Splint Interventions: Criss Alvine 10/12/2013, 5:50 PM

## 2013-10-12 NOTE — Progress Notes (Signed)
Patient for PEG placement today.  I have asked Endo personnel if they have long AngioCath in case patient's abdominal wall thickness was too great for standard catheter.  They are looking into this.  Kathryne Eriksson. Dahlia Bailiff, MD, [place] [phone] Trauma Surgeon

## 2013-10-12 NOTE — Procedures (Signed)
Bronchoscopy Procedure Note [name] [id number] Jan 03, 1946  Procedure: Bronchoscopy Indications: to facilitate percutaneous tracheostomy. Please refer to separate procedure note by Dr Nelda Marseille  Procedure Details Consent: Risks of procedure as well as the alternatives and risks of each were explained to the (patient/caregiver).  Consent for procedure obtained. Time Out: Verified patient identification, verified procedure, site/side was marked, verified correct patient position, special equipment/implants available, medications/allergies/relevent history reviewed, required imaging and test results available.  Performed  In preparation for procedure, patient was given 100% FiO2 and bronchoscope lubricated. Sedation: Benzodiazepines and Etomidate  Airway entered and the following bronchi were examined: RUL, RML, RLL, LUL and LLL.   Thin blood tinged secretions suctioned. No endobronchial lesions seen. No samples taken.   Evaluation Hemodynamic Status: BP stable throughout; O2 sats: stable throughout Patient's Current Condition: stable Specimens:  None Complications: No apparent complications Patient did tolerate procedure well.   [name], MD, PhD 10/12/2013, 10:19 AM [place] [facility] [phone] or if no answer [phone]

## 2013-10-12 NOTE — Progress Notes (Signed)
Pt placed on 35% ATC and is tolerating well at this time. No complications noted. RT will monitor.

## 2013-10-12 NOTE — Procedures (Signed)
Bedside Tracheostomy Insertion Procedure Note   Patient Details:   Name: [name] DOB: Apr 17, 1946 MRN: [id number]  Procedure: Tracheostomy  Pre Procedure Assessment: ET Tube Size:7.5  ET Tube secured at lip (cm):22  Bite block in place: No Breath Sounds: Clear  Post Procedure Assessment: BP 147/78  Pulse 70  Temp(Src) 98.5 F (36.9 C) (Axillary)  Resp 16  Ht 6' (1.829 m)  Wt 277 lb 12.5 oz (126 kg)  BMI 37.67 kg/m2  SpO2 99% O2 sats: stable throughout Complications: No apparent complications Patient did tolerate procedure well Tracheostomy Brand:Shiley Tracheostomy Style:Cuffed Tracheostomy Size: 6 Tracheostomy Secured [id number] Tracheostomy Placement Confirmation:Trach cuff visualized and in place and Chest X ray ordered for placement    [name] 10/12/2013, 10:45 AM

## 2013-10-12 NOTE — Progress Notes (Signed)
NUTRITION FOLLOW UP  DOCUMENTATION CODES  Per approved criteria  -Obesity Unspecified   Intervention:    Resume Vital High protein @ 65 ml/hr to provide 1560 kcal, 137 grams protein, and 1304 ml free water  Prostat to 30 ml BID  Above TF regimen, Prostat, and current propofol provides 2174 kcal (26 kcal/kg ideal body weight), 167 grams protein, and 1304 ml free water  Adjust TF if pt remains on Propofol  Nutrition Dx:   Inadequate oral intake related to inability to eat as evidenced by NPO status, ongoing.  Goal:   Enteral nutrition to provide 60-70% of estimated calorie needs (22-25 kcals/kg ideal body weight) and 100% of estimated protein needs, based on ASPEN guidelines for permissive underfeeding in critically ill obese individuals, met.  Monitor:   Vent status, TF initiation and tolerance, weight trend, labs  Assessment:   Pt with multiple medical co-morbidities (uncontrolled DM) who lives with a roommate admitted with altered mental status. Pt with left temp/parietal ICH. Pt desaturated and was intubated.   Patient extubated 2/18. Patient re-intubated later on 2/18 due to respiratory failure. RD team received consult for TF initiation and management.  Pt is s/p trach/PEG 2/27. TF currently off due to PEG placement. Pt on higher rate of propofol after procedures but likely to decrease next day.   Patient is currently intubated on ventilator support.  MV:10.5 L/min Temp (24hrs), Avg:98.6 F (37 C), Min:98 F (36.7 C), Max:99 F (37.2 C) Propofol: 15.7 ml/hr providing  414 kcal daily from lipid   Height: Ht Readings from Last 1 Encounters:  10/02/13 6' (1.829 m)    Weight Status:   Wt Readings from Last 1 Encounters:  10/12/13 277 lb 12.5 oz (126 kg)  10/02/13          289 lb   Body mass index is 37.67 kg/(m^2).   Re-estimated needs:  Kcal: 2083 Protein: >162 grams Fluid: >2 L  Skin: no wounds  Diet Order: NPO   Intake/Output Summary (Last 24  hours) at 10/12/13 1559 Last data filed at 10/12/13 1500  Gross per 24 hour  Intake 1738.2 ml  Output   1770 ml  Net  -31.8 ml    Last BM: 2/23   Labs:   Recent Labs Lab 10/10/13 0525 10/11/13 0500 10/12/13 0640  NA 146 148* 148*  K 3.3* 3.2* 4.0  CL 109 108 109  CO2 $Re'25 26 28  'oLX$ BUN 35* 31* 30*  CREATININE 1.11 1.04 0.98  CALCIUM 9.3 9.4 9.5  MG 2.2 2.3 2.2  PHOS 2.7 3.2 3.5  GLUCOSE 211* 220* 144*    CBG (last 3)   Recent Labs  10/12/13 0318 10/12/13 0744 10/12/13 1148  GLUCAP 155* 142* 148*    Scheduled Meds: . amiodarone  200 mg Oral Daily  . antiseptic oral rinse  15 mL Mouth Rinse QID  . atorvastatin  10 mg Oral q1800  . brimonidine  1 drop Both Eyes TID  . carvedilol  25 mg Per Tube BID WC  . chlorhexidine  15 mL Mouth Rinse BID  . cloNIDine  0.1 mg Per Tube BID  . dorzolamide-timolol  1 drop Both Eyes BID  . enoxaparin (LOVENOX) injection  40 mg Subcutaneous Q24H  . feeding supplement (PRO-STAT SUGAR FREE 64)  30 mL Per Tube BID  . free water  200 mL Per Tube Q6H  . insulin aspart  0-20 Units Subcutaneous 6 times per day  . isosorbide dinitrate  10 mg Oral BID  .  magnesium oxide  400 mg Oral Daily  . pantoprazole sodium  40 mg Per Tube QHS  . pneumococcal 23 valent vaccine  0.5 mL Intramuscular Tomorrow-1000  . polyethylene glycol  17 g Oral Daily  . polyvinyl alcohol  1 drop Both Eyes BID  . senna-docusate  1 tablet Oral BID    Continuous Infusions: . sodium chloride 20 mL/hr at 10/12/13 1500  . sodium chloride    . feeding supplement (VITAL HIGH PROTEIN) Stopped (10/12/13 0000)  . propofol 20 mcg/kg/min (10/12/13 1509)    [name], Dietetic Intern Pager: [phone]  Intern note/chart reviewed. Revisions made.  [place], [place], [place] Pager [phone] After Hours Pager

## 2013-10-12 NOTE — Op Note (Addendum)
OPERATIVE REPORT  DATE OF OPERATION: 10/12/2013  PATIENT:  [name]  67 y.o. male  PRE-OPERATIVE DIAGNOSIS:  CVA/Chronic ventilatory support  POST-OPERATIVE DIAGNOSIS:  Same with need for chronic nutritional support  PROCEDURE:  Procedure(s): ESOPHAGOGASTRODUODENOSCOPY (EGD) PERCUTANEOUS ENDOSCOPIC GASTROSTOMY (PEG) PLACEMENT  SURGEON:  Surgeon(s): James O Wyatt, MD  ASSISTANT: Jeffery, PA-C  ANESTHESIA:   IV sedation  EBL: <10 ml  BLOOD ADMINISTERED: none  DRAINS: Urinary Catheter (Foley) and Gastrostomy Tube   SPECIMEN:  No Specimen  COUNTS CORRECT:  YES  PROCEDURE DETAILS: This procedure was performed at the patient's bedside in the [facility] care unit.   After a proper time out was performed identifying the patient and the procedure to be performed, the abdomen was prepped and draped in usual sterile manner. A Pentax [id number] endoscope was passed through the patient's oropharynx into the upper esophagus with his NG tube in place. Once we passed the endoscope into the stomach the NG tube was removed.  Were able to pass endoscope down to the pylorus but could not cannulate the pylorus into the duodenum. We subsequently retrieved the scope back into the main part of the stomach will we could see the impulse from the assistance of finger on the anterior abdominal wall. It was at that site that the patient was anesthetized and subsequently an angiocatheter passed under direct vision into the stomach. It was through the angiocatheter that a looped the blue wire was passed. This wire was brought through the patient's mouth using endoscope and subsequently looped around the pull-through gastrostomy tube was then pulled back through the patient's mouth into the stomach not to the anterior gastric wall.  Pictures of the esophagus the stomach and NG tube in place are included with chart. The tube was in proper position we subsequently secured in place in the usual manner  with a bolster supplied with the kit. All counts were correct.  PATIENT DISPOSITION:  Done at the patient's bedside in [facility] ICU, patient in stable condition.   WYATT, JAMES O 2/27/201511:04 AM     

## 2013-10-12 NOTE — Progress Notes (Signed)
Stroke Team Progress Note  HISTORY [name] is an 68 y.o. male who lives with a roommate. Roommate felt that patient was not at his baseline today and this morning felt hat he was hallucinating. He went to his day program where they noticed that he was not talking right. EMS was called at that time. Last known well 09/30/2013 at 22:30. Patient was not administerd TPA secondary to delay in arrival. He was admitted to the neuro ICU for further evaluation and treatment.  SUBJECTIVE Stable no changes. Trach and PEG planned for today. Repeat Ct head shows slight decrease in hematoma size and unchanged edema  OBJECTIVE Most recent Vital Signs: Filed Vitals:   10/12/13 0500 10/12/13 0600 10/12/13 0700 10/12/13 0723  BP: 145/81 147/91 156/90 156/90  Pulse: 74 77 73 70  Temp:    98.5 F (36.9 C)  TempSrc:    Axillary  Resp: 14 14 16 20   Height:      Weight:  126 kg (277 lb 12.5 oz)    SpO2: 100% 98% 100% 97%   CBG (last 3)   Recent Labs  10/11/13 2014 10/11/13 2355 10/12/13 0318  GLUCAP 198* 209* 155*    IV Fluid Intake:   . sodium chloride 20 mL/hr at 10/11/13 1400  . sodium chloride    . feeding supplement (VITAL HIGH PROTEIN) Stopped (10/12/13 0000)  . propofol 30 mcg/kg/min (10/12/13 0559)    MEDICATIONS  . amiodarone  200 mg Oral Daily  . antiseptic oral rinse  15 mL Mouth Rinse QID  . atorvastatin  10 mg Oral q1800  . brimonidine  1 drop Both Eyes TID  . carvedilol  25 mg Per Tube BID WC  . chlorhexidine  15 mL Mouth Rinse BID  . cloNIDine  0.1 mg Per Tube BID  . dorzolamide-timolol  1 drop Both Eyes BID  . enoxaparin (LOVENOX) injection  40 mg Subcutaneous Q24H  . feeding supplement (PRO-STAT SUGAR FREE 64)  30 mL Per Tube BID  . free water  200 mL Per Tube Q6H  . insulin aspart  0-20 Units Subcutaneous 6 times per day  . isosorbide dinitrate  10 mg Oral BID  . magnesium oxide  400 mg Oral Daily  . pantoprazole sodium  40 mg Per Tube QHS  . pneumococcal 23  valent vaccine  0.5 mL Intramuscular Tomorrow-1000  . polyethylene glycol  17 g Oral Daily  . polyvinyl alcohol  1 drop Both Eyes BID  . senna-docusate  1 tablet Oral BID   PRN:  fentaNYL, hydrALAZINE, labetalol  Diet:  NPO  Activity:  As tolerated DVT Prophylaxis:  Lovenox 40 mg sq daily   CLINICALLY SIGNIFICANT STUDIES Basic Metabolic Panel:   Recent Labs Lab 10/11/13 0500 10/12/13 0640  NA 148* 148*  K 3.2* 4.0  CL 108 109  CO2 26 28  GLUCOSE 220* 144*  BUN 31* 30*  CREATININE 1.04 0.98  CALCIUM 9.4 9.5  MG 2.3 2.2  PHOS 3.2 3.5   Liver Function Tests:  No results found for this basename: AST, ALT, ALKPHOS, BILITOT, PROT, ALBUMIN,  in the last 168 hours CBC:   Recent Labs Lab 10/07/13 0500  10/11/13 0500 10/12/13 0640  WBC 7.4  < > 11.3* 12.3*  NEUTROABS 4.0  --   --   --   HGB 10.8*  < > 11.0* 10.6*  HCT 33.9*  < > 35.3* 34.1*  MCV 81.5  < > 82.5 83.0  PLT 183  < >  210 228  < > = values in this interval not displayed. Coagulation:  No results found for this basename: LABPROT, INR,  in the last 168 hours Cardiac Enzymes:   Recent Labs Lab 10/09/13 1018  CKTOTAL 45  CKMB <1.0   Urinalysis:  No results found for this basename: COLORURINE, APPERANCEUR, LABSPEC, PHURINE, GLUCOSEU, HGBUR, BILIRUBINUR, KETONESUR, PROTEINUR, UROBILINOGEN, NITRITE, LEUKOCYTESUR,  in the last 168 hours Lipid Panel    Component Value Date/Time   CHOL 134 10/05/2013 0500   TRIG 132 10/11/2013 0522   HDL 37* 10/05/2013 0500   CHOLHDL 3.6 10/05/2013 0500   VLDL 30 10/05/2013 0500   LDLCALC 67 10/05/2013 0500   HgbA1C  Lab Results  Component Value Date   HGBA1C 8.2* 10/02/2013    Urine Drug Screen:     Component Value Date/Time   LABOPIA NONE DETECTED 10/01/2013 1915   COCAINSCRNUR NONE DETECTED 10/01/2013 1915   COCAINSCRNUR NEG 12/16/2008 2246   LABBENZ NONE DETECTED 10/01/2013 1915   LABBENZ NEG 12/16/2008 2246   AMPHETMU NONE DETECTED 10/01/2013 1915   AMPHETMU NEG 12/16/2008  2246   THCU NONE DETECTED 10/01/2013 1915   LABBARB NONE DETECTED 10/01/2013 1915    Alcohol Level:  No results found for this basename: ETH,  in the last 168 hours  CT of the brain   2/27/20151. Overall slight interval decrease in size of left temporoparietal  intraparenchymal hemorrhage now measuring 3.4 x 2.6 cm, previously 3.5 x 2.9 cm. Similar localized mass effect without midline shift. 2. No new intracranial hemorrhage or large vessel territory infarct. 3. Slightly improved paranasal sinus disease.  10/08/2013 Interval increase in size of left temporoparietal intraparenchymal hematoma, was 2.2 x 3.1 cm, now 2.9 x 3.5 cm concerning for rebleed/ propagation of hemorrhagic stroke. Local mass effect without midline shift. No acute large vascular territory.  10/05/2013 Contracting left temporoparietal intraparenchymal hematoma was 2.7 x 3.8 cm, now 2.2 x 3.1 cm without rebleed. 10/03/2013 Contracting left temporal parietal intraparenchymal hematoma, no rebleed. Moderate white matter changes suggest chronic small vessel ischemic disease. 10/02/2013   Limited portable CT: Skullbase and posterior fossa not been included.  Evolving left temporoparietal intraparenchymal hematoma with similar mass effect. No new hemorrhage.   10/01/2013    1. 4 x 3 cm left temporal/parietal parenchymal hematoma. 2. 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 3. Diffuse inflammatory paranasal sinus disease.     MRI/MRA head pacemaker  2D Echo There is a mass present in the right atrium and right ventricle. It is not seen in the IVC. It was not present on the study of May, 2014. Considering all factors, the most likely etiology is clot. The mass is seen in the RA. There is also mass that moves across the tricuspid valve. There is probably also mass in the RV cavity. I can not be sure, but all of these targets might be connected.  Carotid Doppler (on hold d/t IJ)  LE Venous Dopplers There is no evidence of  DVT or SVT in the bilateral lower extremities.  CT angio chest 1. Right lower lobar pulmonary embolus. 2. Left upper lobe on bilateral lower lobe airspace disease. While this likely represents atelectasis, early infection is not excluded. 3. Mild cardiomegaly and atherosclerotic disease. 4. Support apparatus as described.  CXR   10/12/2013 Low lung volumes with left basilar atelectasis. Support apparatuses as described. 10/11/2013 1. Endotracheal tube tip 5.1 cm above the carina. Central line and NG tube in stable position. 2. Persistent bibasilar  infiltrates and/or atelectasis. No change.  10/10/2013 1. Line and tube position stable. 2. Severe stable cardiomegaly. 3. Slight clearing of mild bibasilar atelectasis and/or infiltrates. 10/09/2013 Cardiomegaly without edema. No change in bibasilar atelectasis. Left IJ catheter has backed out slightly with the tip now just in  the superior vena cava. 10/07/2013 Enlargement of cardiac silhouette. Mild bibasilar atelectasis with improved aeration at right base versus previous study. 10/07/2013 Suboptimal exam due to positioning. Persistent low lung volumes and right lower lung opacity. 10/06/2013 Increased right base atelectasis and effusion. 10/05/2013 Stable cardiomegaly, bibasilar airspace opacities with improved  aeration of left lung base. Probable right middle lobe collapse. Small residual pleural effusions.  10/04/2013 Decreased right basilar atelectasis. Otherwise no significant  change. 10/03/2013 Fairly small right pleural effusion with right base atelectasis. Lungs are otherwise clear. Tube and catheter positions as described without pneumothorax. 10/02/2013    Endotracheal tube 4 cm above the carina. Left central line tip projected laterally towards the lateral wall of the upper SVC. No pneumothorax.  Cardiomegaly with vascular congestion.  Small effusions and bibasilar atelectasis.    10/01/2013   The appearance of the pulmonary interstitium suggests mild  interstitial edema likely secondary to CHF. There is no evidence of pneumonia or pleural effusion.     EKG  Sinus or ectopic atrial rhythm. Prolonged PR interval. Anterior infarct, old. No significant change was found.   Therapy Recommendations   Physical Exam General: The patient is obese. He is intubated. Cardiovascular: Distant heart sounds, regular rate. Abdomen: Obese, nontender, bowel sounds present but diminished. Skin: No significant peripheral edema is noted. 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  Neurologic Exam Mental status: The patient is intubated and sedated. Aphasic and will not follow commands. Cranial nerves: Opens eyes partially to auditory stimuli. Facial symmetry is present. The patient is intubated, sedated. Eyes are deviated to the left, no blink to threat. Some facial grimace is noted with pain stimulation. The patient will open his eyes with stimulation. Motor: The patient has withdrawal movements on all 4 extremities. Sensory examination: The patient will respond to deep pain stimulation with withdrawal on all 4 extremities. Coordination: Cerebellar testing cannot be performed. Gait and station: The patient is on bedrest, the patient could not be ambulated. Reflexes: Deep tendon reflexes are symmetric, but are depressed.   ASSESSMENT [name] is a 68 y.o. male presenting with altered speech. Imaging confirms a left temporal parietal IPH with significant surrounding edema - no hydrocephalus, no [name]. Suspect underlying mass or ischemic infarct given amt of edema, possible vegetation. Unable to get MRI to confirm due to pacer. Slight increase in hemmorrhage size 10/08/2013. But stable on Ct head 10/12/13. Has respiratory insufficiency due to PE from mobile mass (clot vs vegetation) found last week on pacer wire in RA extending into RV. Not an anticoagulation candidate for treatment due to [place].  Placed on empiric abs for possible endocarditis/RA vegetation on  wire - abx to stop 2/26. On aspirin 81 mg orally every day prior to admission. Patient purposeful, localizes; global aphasia, right hemirparesis, dysphagia.  Respiratory failure, intubated  due to respiratory insufficiency, low sats after admission. Extubated/reintubated 2/18 am due to pulmonary edema vs aspiration pneumonia. Started on vanc and zosyn. Suspected OSA/OHS. Unable to wean. RLL pulmonary embolus, not a anticoagulation candidate due to hemorrhage. PE from mobile mass.  obstructive sleep apnea  atrial fibrillation, not a coumadin candidate secondary to hemorrhage. Not on prior to admission due to hx rectal bleeding. hypertension  Hyperlipidemia, on crestor 40 mg daily PTA, now on no statin as NPO  Pacemaker. intermittant CHB with appropriate PPM function.  CAD  Non-ischemic cardiomyopathy  Diabetes, uncontrolled, HgbA1c 8.2, goal 7.0 Obesity, Body mass index is 37.67 kg/(m^2).   Legally blind  Herpes  Chronic kidney disease, stage 3  Hypokalemia, K 4.0  Hospital day # 11  TREATMENT/PLAN  Continue ICU level care  Trach and PEG planned for today  Reconsider carotid doppler once IJ is removed  Consider antiplatelet post trach/PEG, aspirin 81 mg daily,    LTACH consult to see if he is a candidate, this is underway; if not, likely SNF at time of discharge.  D/w daughter and Dr Nelda Marseille This patient is critically ill and at significant risk of neurological worsening, death and care requires constant monitoring of vital signs, hemodynamics,respiratory and cardiac monitoring,review of multiple databases, neurological assessment, discussion with family, other specialists and medical decision making of high complexity. I spent 30 minutes of neurocritical care time  in the care of  this patient. Antony Contras, MD

## 2013-10-12 NOTE — Procedures (Signed)
Percutaneous Tracheostomy Placement  Consent from family.  Patient sedated, paralyzed and positioned.  Area cleaned, lidocaine/epi injected, skin incision done followed by blunt dissection.  One small midline vessel noted and was tied and cut.  Blunt dissection done down to trachea.  Trachea entered 2 tracheal rings below cricoid bone.  Wire placed and visualized bronchoscopically.  Catheter removed, trachea crushed and airway dilated.  Size 6 cuffed trach placed.  Wire removed and trach sutured.  Bronchoscopically seen trach is well above carina.  Patient tolerated the procedure well without complications.  CXR ordered and pending.  Rush Farmer, M.D. [facility] Pulmonary/Critical Care Medicine. Pager: [phone]. After hours pager: [phone].

## 2013-10-13 ENCOUNTER — Encounter (HOSPITAL_COMMUNITY): Payer: Self-pay | Admitting: General Surgery

## 2013-10-13 ENCOUNTER — Inpatient Hospital Stay (HOSPITAL_COMMUNITY): Payer: Medicare Other

## 2013-10-13 DIAGNOSIS — Z930 Tracheostomy status: Secondary | ICD-10-CM

## 2013-10-13 LAB — GLUCOSE, CAPILLARY
GLUCOSE-CAPILLARY: 130 mg/dL — AB (ref 70–99)
Glucose-Capillary: 165 mg/dL — ABNORMAL HIGH (ref 70–99)
Glucose-Capillary: 203 mg/dL — ABNORMAL HIGH (ref 70–99)
Glucose-Capillary: 192 mg/dL — ABNORMAL HIGH (ref 70–99)
Glucose-Capillary: 148 mg/dL — ABNORMAL HIGH (ref 70–99)

## 2013-10-13 LAB — CBC
HCT: 35.2 % — ABNORMAL LOW (ref 39.0–52.0)
Hemoglobin: 11 g/dL — ABNORMAL LOW (ref 13.0–17.0)
MCH: 25.6 pg — AB (ref 26.0–34.0)
MCHC: 31.3 g/dL (ref 30.0–36.0)
MCV: 82.1 fL (ref 78.0–100.0)
Platelets: 224 10*3/uL (ref 150–400)
RBC: 4.29 MIL/uL (ref 4.22–5.81)
RDW: 16.4 % — ABNORMAL HIGH (ref 11.5–15.5)
WBC: 13.3 10*3/uL — ABNORMAL HIGH (ref 4.0–10.5)

## 2013-10-13 LAB — PHOSPHORUS: PHOSPHORUS: 3.7 mg/dL (ref 2.3–4.6)

## 2013-10-13 LAB — BLOOD GAS, ARTERIAL
ACID-BASE EXCESS: 2.1 mmol/L — AB (ref 0.0–2.0)
Bicarbonate: 25.3 mEq/L — ABNORMAL HIGH (ref 20.0–24.0)
DRAWN BY: 347641
FIO2: 0.3 %
LHR: 16 {breaths}/min
O2 Saturation: 97.6 %
PATIENT TEMPERATURE: 100.2
PEEP: 5 cmH2O
PH ART: 7.477 — AB (ref 7.350–7.450)
TCO2: 26.3 mmol/L (ref 0–100)
VT: 670 mL
pCO2 arterial: 34.9 mmHg — ABNORMAL LOW (ref 35.0–45.0)
pO2, Arterial: 96.8 mmHg (ref 80.0–100.0)

## 2013-10-13 LAB — BASIC METABOLIC PANEL
BUN: 27 mg/dL — AB (ref 6–23)
CALCIUM: 9.5 mg/dL (ref 8.4–10.5)
CO2: 26 meq/L (ref 19–32)
Chloride: 108 mEq/L (ref 96–112)
Creatinine, Ser: 1.03 mg/dL (ref 0.50–1.35)
GFR calc Af Amer: 86 mL/min — ABNORMAL LOW (ref >90)
GFR calc non Af Amer: 74 mL/min — ABNORMAL LOW (ref >90)
GLUCOSE: 150 mg/dL — AB (ref 70–99)
Potassium: 3.6 mEq/L — ABNORMAL LOW (ref 3.7–5.3)
SODIUM: 147 meq/L (ref 137–147)

## 2013-10-13 LAB — MAGNESIUM: Magnesium: 2.2 mg/dL (ref 1.5–2.5)

## 2013-10-13 MED ORDER — POTASSIUM CHLORIDE 10 MEQ/100ML IV SOLN: 10.0000 meq | INTRAVENOUS | Status: DC

## 2013-10-13 MED ORDER — ACETAMINOPHEN 325 MG PO TABS
650.0000 mg | ORAL_TABLET | Freq: Four times a day (QID) | ORAL | Status: DC | PRN
  Administered 2013-10-13: 650 mg via ORAL
  Filled 2013-10-13: qty 2

## 2013-10-13 MED ORDER — POTASSIUM CHLORIDE 10 MEQ/50ML IV SOLN
10.0000 meq | INTRAVENOUS | Status: AC
  Administered 2013-10-13 (×2): 10 meq via INTRAVENOUS
  Filled 2013-10-13 (×2): qty 50

## 2013-10-13 MED ORDER — MIDAZOLAM HCL 2 MG/2ML IJ SOLN: 2.0000 mg | INTRAMUSCULAR | Status: DC | PRN

## 2013-10-13 MED ORDER — ASPIRIN EC 81 MG PO TBEC
81.0000 mg | DELAYED_RELEASE_TABLET | Freq: Every day | ORAL | Status: DC
  Administered 2013-10-13 – 2013-10-15 (×2): 81 mg via ORAL
  Filled 2013-10-13 (×4): qty 1

## 2013-10-13 NOTE — Progress Notes (Signed)
1 Day Post-Op  Subjective: No events overnight.  Tube feeds already started.  No issues from G tube.  Objective: Vital signs in last 24 hours: Temp:  [98.2 F (36.8 C)-100.2 F (37.9 C)] 100.2 F (37.9 C) (02/28 0726) Pulse Rate:  [67-81] 78 (02/28 0726) Resp:  [16-27] 25 (02/28 0726) BP: (80-180)/(55-95) 137/71 mmHg (02/28 0726) SpO2:  [93 %-100 %] 98 % (02/28 0726) FiO2 (%):  [30 %-35 %] 35 % (02/28 0726) Weight:  [279 lb 1.6 oz (126.6 kg)] 279 lb 1.6 oz (126.6 kg) (02/28 0600) Last BM Date: 10/12/13  Intake/Output from previous day: 02/27 0701 - 02/28 0700 In: 980 [I.V.:820; NG/GT:160] Out: 1235 [Urine:1235] Intake/Output this shift:    General appearance: slowed mentation GI: soft, non tender, G tube in place.  no evidence of trauma  Lab Results:   Recent Labs  10/12/13 0640 10/13/13 0505  WBC 12.3* 13.3*  HGB 10.6* 11.0*  HCT 34.1* 35.2*  PLT 228 224   BMET  Recent Labs  10/12/13 0640 10/13/13 0505  NA 148* 147  K 4.0 3.6*  CL 109 108  CO2 28 26  GLUCOSE 144* 150*  BUN 30* 27*  CREATININE 0.98 1.03  CALCIUM 9.5 9.5   PT/INR No results found for this basename: LABPROT, INR,  in the last 72 hours ABG  Recent Labs  10/12/13 0421 10/13/13 0410  PHART 7.510* 7.477*  HCO3 27.3* 25.3*    Studies/Results: Ct Head Wo Contrast  10/12/2013   CLINICAL DATA:  Followup intracranial hemorrhage  EXAM: CT HEAD WITHOUT CONTRAST  TECHNIQUE: Contiguous axial images were obtained from the base of the skull through the vertex without intravenous contrast.  COMPARISON:  Prior CT from 10/08/2013.  FINDINGS: Left temporoparietal hemorrhage is slightly decreased in size measuring 3.4 x 2.6 cm, previously 2.9 x 3.5 cm. Associated vasogenic edema and sulcal effacement is not significantly changed. No midline shift.  Cerebral volume within normal limits for age. Scattered and confluent supratentorial white matter hypodensity is again seen, compatible with chronic small  vessel ischemic changes. No acute large vessel territory infarct. No new intracranial hemorrhage identified.  No mass lesion or midline shift. Ventricles are stable in size without evidence of hydrocephalus. No extra-axial fluid collection. Dolichoectasia of the basilar artery again noted. Prominent atherosclerotic calcifications present within the vertebral arteries, the ectatic basilar artery, and the carotid siphons.  Chronic paranasal sinusitis again noted, slightly improved relative to prior. Minimal soft tissue density again noted within the left mastoid air cells. Orbits are unchanged.  Calvarium remains intact.  IMPRESSION: 1. Overall slight interval decrease in size of left temporoparietal intraparenchymal hemorrhage now measuring 3.4 x 2.6 cm, previously 3.5 x 2.9 cm. Similar localized mass effect without midline shift. 2. No new intracranial hemorrhage or large vessel territory infarct. 3. Slightly improved paranasal sinus disease.   Electronically Signed   By: Jeannine Boga M.D.   On: 10/12/2013 05:48   Chest Portable 1 View To Assess Tube Placement And Rule-out Pneumothorax  10/12/2013   CLINICAL DATA:  Tracheostomy tube placement  EXAM: PORTABLE CHEST - 1 VIEW  COMPARISON:  10/12/2013  FINDINGS: There is a tracheostomy tube in place. No diagnostic pneumothorax. Cardiomegaly again noted. NG tube has been removed. Stable left IJ central line position. Persistent bilateral basilar atelectasis or infiltrate. No pulmonary edema.  IMPRESSION: Tracheostomy tube in place. No diagnostic pneumothorax. Bilateral basilar atelectasis or infiltrate. No pulmonary edema.   Electronically Signed   By: Orlean Bradford.D.  On: 10/12/2013 13:36   Dg Chest Port 1 View  10/12/2013   CLINICAL DATA:  Evaluate endotracheal tube.  EXAM: PORTABLE CHEST - 1 VIEW  COMPARISON:  10/11/2013  FINDINGS: Endotracheal tube is roughly 4.6 cm above the carina. Again noted are low lung volumes. Left basilar densities are most  compatible with atelectasis. Heart size remains enlarged. Stable position of the right dual lead cardiac pacemaker. The left jugular central venous catheter tip is now horizontally oriented in the upper SVC region. Nasogastric tube extends into the abdomen.  IMPRESSION: Low lung volumes with left basilar atelectasis.  Support apparatuses as described.   Electronically Signed   By: Markus Daft M.D.   On: 10/12/2013 07:39    Anti-infectives: Anti-infectives   Start     Dose/Rate Route Frequency Ordered Stop   10/07/13 1300  cefTRIAXone (ROCEPHIN) 1 g in dextrose 5 % 50 mL IVPB     1 g 100 mL/hr over 30 Minutes Intravenous Every 24 hours 10/07/13 1115 10/11/13 1319   10/06/13 1300  vancomycin (VANCOCIN) IVPB 1000 mg/200 mL premix  Status:  Discontinued     1,000 mg 200 mL/hr over 60 Minutes Intravenous Every 12 hours 10/06/13 1049 10/07/13 1115   10/04/13 1000  vancomycin (VANCOCIN) 1,250 mg in sodium chloride 0.9 % 250 mL IVPB  Status:  Discontinued     1,250 mg 166.7 mL/hr over 90 Minutes Intravenous Every 12 hours 10/03/13 2056 10/06/13 1027   10/03/13 2100  piperacillin-tazobactam (ZOSYN) IVPB 3.375 g     3.375 g 100 mL/hr over 30 Minutes Intravenous  Once 10/03/13 2056 10/03/13 2159   10/03/13 2100  vancomycin (VANCOCIN) 2,500 mg in sodium chloride 0.9 % 500 mL IVPB     2,500 mg 250 mL/hr over 120 Minutes Intravenous  Once 10/03/13 2056 10/03/13 2345   10/03/13 0500  piperacillin-tazobactam (ZOSYN) IVPB 3.375 g  Status:  Discontinued     3.375 g 12.5 mL/hr over 240 Minutes Intravenous Every 8 hours 10/03/13 2056 10/07/13 1115      Assessment/Plan: s/p Procedure(s) with comments: ESOPHAGOGASTRODUODENOSCOPY (EGD) (N/A) - bedside PERCUTANEOUS ENDOSCOPIC GASTROSTOMY (PEG) PLACEMENT (N/A) tube feeds to goal. Contact if further issues. Will sign off.    LOS: 12 days    [name] 10/13/2013

## 2013-10-13 NOTE — Progress Notes (Signed)
PULMONARY / CRITICAL CARE MEDICINE  Name: [name] MRN: [id number] DOB: January 20, 1946   LOS 12  days   ADMISSION DATE:  10/01/2013 CONSULTATION DATE:  10/02/2013  REFERRING MD :  Stroke PRIMARY SERVICE: Stroke  CHIEF COMPLAINT:  Acute respiratory failure   BRIEF PATIENT DESCRIPTION: 68 year old male with multiple co-morbids including suspected OSA/OHS. Admitted for left temporal / parietal ICH on 2/16. Developed decrease LOC and progressive respiratory failure early in the am on 2/17. PCCM asked to see emergently in consult.   SIGNIFICANT EVENTS / STUDIES:  2/16  Head CT >>> 1. 4 x 3 cm left temporal/parietal parenchymal hematoma. 2. 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 3. Diffuse inflammatory paranasal sinus disease. 2/17  Head CT >>>  Evolving left temporoparietal intraparenchymal hematoma with similar mass effect. No new hemorrhage. 2/18  Head CT >>>  Contracting left temporal parietal intraparenchymal hematoma, no rebleed. Moderate white matter changes suggest chronic small vessel ischemic disease. 2/18  Extubated / re-intubated due to pulmonary edema vs aspiration pneumonia vs other 2/19  TTE >>> mobile mass in RA on the pacer wire, likely clot (vs vegetation) 2/19  CT-PA >> R LL acute PE, LUL and BLL atx vs infiltrates 10/08/13 - NEURO: increase size of Left temp/parietal hemorrhage to 3.5cm without midline shift. Neuro advises holding off anticoagulation for one more week at the least  10/08/13: CARDS: Mural thrombus of heart: TEE confirms mass in the right side of the heart. This is a large mass consistent with clot on the pacemaker lead and prolapsing. No options for intervention at this point and he is not a anticoagulation candidate. No DVT. No indication for filter. 2/25 Spoke with daughter, ok to proceed with trach/peg, plan trach on Friday at 40 AM. 2/27 Spoke with Dr. Leonie Man, does not wish to start any anticoagulation short of DVT prophylaxis  lovenox. 2/28 NAD on t collar  LINES / TUBES: L IJ CVL 2/17 >>> OETT 2/17 >>> 2/18; 2/18 >>>2/27 Trach (JY) 2/27>>> OGT 1/17 >>> 2/18; 2/18 >>> Foley 2/17 >>>  CULTURES: MRSA PCR 2/16 - negative Blood 2/18 >> ngtd  resp 2/19 >> oropharyngeal flora  ANTIBIOTICS: Zosyn 2/18 >>>2/22 Vancomycin 2/18 >>>2/22 ceftriaxone 2/22>>> 2/26  SUBJECTIVE:   Follows some commands On ATc  VITAL SIGNS: Temp:  [98.2 F (36.8 C)-100.2 F (37.9 C)] 100.2 F (37.9 C) (02/28 0726) Pulse Rate:  [67-81] 79 (02/28 0800) Resp:  [16-27] 24 (02/28 0800) BP: (80-180)/(55-95) 175/80 mmHg (02/28 0800) SpO2:  [93 %-100 %] 98 % (02/28 0800) FiO2 (%):  [30 %-35 %] 35 % (02/28 0800) Weight:  [126.6 kg (279 lb 1.6 oz)] 126.6 kg (279 lb 1.6 oz) (02/28 0600)  HEMODYNAMICS:   VENTILATOR SETTINGS: Vent Mode:  [-] PRVC FiO2 (%):  [30 %-35 %] 35 % Set Rate:  [16 bmp] 16 bmp Vt Set:  [670 mL] 670 mL PEEP:  [5 cmH20] 5 cmH20 Plateau Pressure:  [19 cmH20-20 cmH20] 20 cmH20  INTAKE / OUTPUT: Intake/Output     02/27 0701 - 02/28 0700 02/28 0701 - 03/01 0700   I.V. (mL/kg) 820 (6.5)    NG/GT 160    IV Piggyback     Total Intake(mL/kg) 980 (7.7)    Urine (mL/kg/hr) 1235 (0.4)    Total Output 1235     Net -255          Stool Occurrence 1 x     PHYSICAL EXAMINATION: General:  Chronically ill appearing, On t collar ,  follows some commands  Neuro: Eyes open,  following command. HEENT:  OETT / OGT intact, PERRL. Cardiovascular:  Regular, no m/g/r noted. Lungs:  Bilateral air entry, few rhonchi. T collar in place, secretions rattling.  Abdomen:  Obese, soft, nontender, nondistended +bs. Musculoskeletal:  Trace edema. Skin:  Intact.  LABS:  PULMONARY  Recent Labs Lab 10/11/13 0500 10/12/13 0421 10/13/13 0410  PHART 7.447 7.510* 7.477*  PCO2ART 38.4 34.2* 34.9*  PO2ART 91.4 86.0 96.8  HCO3 26.1* 27.3* 25.3*  TCO2 27.3 28 26.3  O2SAT 97.1 97.0 97.6    CBC  Recent Labs Lab 10/11/13 0500  10/12/13 0640 10/13/13 0505  HGB 11.0* 10.6* 11.0*  HCT 35.3* 34.1* 35.2*  WBC 11.3* 12.3* 13.3*  PLT 210 228 224   COAGULATION No results found for this basename: INR,  in the last 168 hours  CARDIAC  No results found for this basename: TROPONINI,  in the last 168 hours  Recent Labs Lab 10/09/13 0410  PROBNP 378.9*   CHEMISTRY  Recent Labs Lab 10/09/13 0410 10/10/13 0525 10/11/13 0500 10/12/13 0640 10/13/13 0505  NA 141 146 148* 148* 147  K 3.3* 3.3* 3.2* 4.0 3.6*  CL 103 109 108 109 108  CO2 26 25 26 28 26   GLUCOSE 180* 211* 220* 144* 150*  BUN 32* 35* 31* 30* 27*  CREATININE 1.09 1.11 1.04 0.98 1.03  CALCIUM 9.1 9.3 9.4 9.5 9.5  MG 2.1 2.2 2.3 2.2 2.2  PHOS 2.9 2.7 3.2 3.5 3.7   Estimated Creatinine Clearance: 98.3 ml/min (by C-G formula based on Cr of 1.03).  LIVER No results found for this basename: AST, ALT, ALKPHOS, BILITOT, PROT, ALBUMIN, INR,  in the last 168 hours INFECTIOUS  Recent Labs Lab 10/10/13 0525  LATICACIDVEN 1.2   ENDOCRINE CBG (last 3)   Recent Labs  10/12/13 1953 10/12/13 2350 10/13/13 0337  GLUCAP 134* 139* 130*   IMAGING x48h  Ct Head Wo Contrast  10/12/2013   CLINICAL DATA:  Followup intracranial hemorrhage  EXAM: CT HEAD WITHOUT CONTRAST  TECHNIQUE: Contiguous axial images were obtained from the base of the skull through the vertex without intravenous contrast.  COMPARISON:  Prior CT from 10/08/2013.  FINDINGS: Left temporoparietal hemorrhage is slightly decreased in size measuring 3.4 x 2.6 cm, previously 2.9 x 3.5 cm. Associated vasogenic edema and sulcal effacement is not significantly changed. No midline shift.  Cerebral volume within normal limits for age. Scattered and confluent supratentorial white matter hypodensity is again seen, compatible with chronic small vessel ischemic changes. No acute large vessel territory infarct. No new intracranial hemorrhage identified.  No mass lesion or midline shift. Ventricles are  stable in size without evidence of hydrocephalus. No extra-axial fluid collection. Dolichoectasia of the basilar artery again noted. Prominent atherosclerotic calcifications present within the vertebral arteries, the ectatic basilar artery, and the carotid siphons.  Chronic paranasal sinusitis again noted, slightly improved relative to prior. Minimal soft tissue density again noted within the left mastoid air cells. Orbits are unchanged.  Calvarium remains intact.  IMPRESSION: 1. Overall slight interval decrease in size of left temporoparietal intraparenchymal hemorrhage now measuring 3.4 x 2.6 cm, previously 3.5 x 2.9 cm. Similar localized mass effect without midline shift. 2. No new intracranial hemorrhage or large vessel territory infarct. 3. Slightly improved paranasal sinus disease.   Electronically Signed   By: Jeannine Boga M.D.   On: 10/12/2013 05:48   Dg Chest Port 1 View  10/13/2013   CLINICAL DATA:  Tracheostomy  EXAM: PORTABLE CHEST - 1 VIEW  COMPARISON:  the previous day's study  FINDINGS: Tracheostomy device is partially seen, appears stable in position. Left IJ central line directed towards the lateral wall of the SVC. Right subclavian pacemaker leads stable. Moderate cardiomegaly as before. Low lung volumes with infrahilar atelectasis/ consolidation left greater than right as before, crowding of perihilar bronchovascular markings. . No effusion. Visualized skeletal structures are unremarkable.  IMPRESSION: Little change since previous day's exam   Electronically Signed   By: Arne Cleveland M.D.   On: 10/13/2013 08:24   Chest Portable 1 View To Assess Tube Placement And Rule-out Pneumothorax  10/12/2013   CLINICAL DATA:  Tracheostomy tube placement  EXAM: PORTABLE CHEST - 1 VIEW  COMPARISON:  10/12/2013  FINDINGS: There is a tracheostomy tube in place. No diagnostic pneumothorax. Cardiomegaly again noted. NG tube has been removed. Stable left IJ central line position. Persistent  bilateral basilar atelectasis or infiltrate. No pulmonary edema.  IMPRESSION: Tracheostomy tube in place. No diagnostic pneumothorax. Bilateral basilar atelectasis or infiltrate. No pulmonary edema.   Electronically Signed   By: Lahoma Crocker M.D.   On: 10/12/2013 13:36   Dg Chest Port 1 View  10/12/2013   CLINICAL DATA:  Evaluate endotracheal tube.  EXAM: PORTABLE CHEST - 1 VIEW  COMPARISON:  10/11/2013  FINDINGS: Endotracheal tube is roughly 4.6 cm above the carina. Again noted are low lung volumes. Left basilar densities are most compatible with atelectasis. Heart size remains enlarged. Stable position of the right dual lead cardiac pacemaker. The left jugular central venous catheter tip is now horizontally oriented in the upper SVC region. Nasogastric tube extends into the abdomen.  IMPRESSION: Low lung volumes with left basilar atelectasis.  Support apparatuses as described.   Electronically Signed   By: Markus Daft M.D.   On: 10/12/2013 07:39   ASSESSMENT / PLAN:  PULMONARY A:  Acute respiratory failure, re intubated 2/18 due to PE from Right atrial lead clot Possible PNA vs atelectasis Acute RLL PE Suspected OSA/OHS DUplex LE negative for DVT 10/08/13 Trach 2/27  P:   - T collar as tolerated - No full anti-coag given ICH, neuro ok with lovenox.  - Continue SCDs but no IVC filter. - ATC. As tolerated  CARDIOVASCULAR A:   HTN Chronic diastolic heart failure Transient hypotension after sedation / intubation, resolved H/o CAD, AF, non-ischemic cardiomyopathy, HTN R atrial mass, Mural thrombus- attached to pacer wire- Looking less likely this is endocarditis P:  - Per Cards. - Goal SBP <160 - at goal. - Empiric abx for possibility of endocarditis/RA vegetation; stopped, last dose is 2/26. - Not a candicate for thrombectomy when he cannot be heparinized or direct t-PA. - Hold ASA. - Labetalol PRN.  RENAL Lab Results  Component Value Date   CREATININE 1.03 10/13/2013   CREATININE  0.98 10/12/2013   CREATININE 1.04 10/11/2013   CREATININE 2.28* 04/23/2013   CREATININE 0.97 10/22/2010    A:   CKD stage 3 Hypokalemia Continued pos balance  P:   - Trend BMP. - Free water as ordered. - K replacement as needed.  GASTROINTESTINAL A:   Obesity Mild elevated LFTs, resolved GERD on PPI preadmission Nutrition P:   - Hold TF for PEG today. - Protonix.  HEMATOLOGIC A:   Mild anemia  dilution P:  - Trend CBC, hgb stable. - PRBC for hgb < 7gm%.  INFECTIOUS A:   Possible pneumonia vs atx, Pct is equivocal. TEE 10/08/13: Mural thrombus and  Not endocarditis  P:   - Ended ceftriaxone on 10/11/13.  ENDOCRINE A:   DM  P:   - SSI.  NEUROLOGIC A: Left temp/parietal ICH w associated edema Acute encephalopathy Worsening hemorrhage 10/08/13 imaging of brain  P:   - Goal RASS 0 to -1. - d/c all sedation . - Fentanyl PRN. - No anticoagulation atleast till 10/14/13 and then reassess.  GLOBAL Cont  t collar, ? LTAC candidate, need advance directives here  Richardson Landry Minor ACNP Maryanna Shape PCCM Pager [phone] till 3 pm If no answer page [phone]  Care during the described time interval was provided by me and/or other providers on the critical care team.  I have reviewed this patient's available data, including medical history, events of note, physical examination and test results as part of my evaluation  CC time x 68m  Keene Gilkey V.  10/13/2013, 9:50 AM

## 2013-10-13 NOTE — Progress Notes (Addendum)
Stroke Team Progress Note  HISTORY [name] is an 68 y.o. male who lives with a roommate. Roommate felt that patient was not at his baseline today and this morning felt hat he was hallucinating. He went to his day program where they noticed that he was not talking right. EMS was called at that time. Last known well 09/30/2013 at 22:30. Patient was not administerd TPA secondary to delay in arrival. He was admitted to the neuro ICU for further evaluation and treatment.  SUBJECTIVE S/p Trach and PEG. Tolerated well.   OBJECTIVE Most recent Vital Signs: Filed Vitals:   10/13/13 0427 10/13/13 0500 10/13/13 0600 10/13/13 0726  BP: 180/88 157/90 163/85 137/71  Pulse:  67 76 78  Temp:    100.2 F (37.9 C)  TempSrc:    Oral  Resp:  17 18 25   Height:      Weight:   126.6 kg (279 lb 1.6 oz)   SpO2:  96% 100% 98%   CBG (last 3)   Recent Labs  10/12/13 1953 10/12/13 2350 10/13/13 0337  GLUCAP 134* 139* 130*    IV Fluid Intake:   . sodium chloride 20 mL/hr at 10/12/13 2100  . sodium chloride    . feeding supplement (VITAL HIGH PROTEIN) 1,000 mL (10/13/13 0653)  . propofol Stopped (10/13/13 0730)    MEDICATIONS  . amiodarone  200 mg Oral Daily  . antiseptic oral rinse  15 mL Mouth Rinse QID  . atorvastatin  10 mg Oral q1800  . brimonidine  1 drop Both Eyes TID  . carvedilol  25 mg Per Tube BID WC  . chlorhexidine  15 mL Mouth Rinse BID  . cloNIDine  0.1 mg Per Tube BID  . dorzolamide-timolol  1 drop Both Eyes BID  . enoxaparin (LOVENOX) injection  40 mg Subcutaneous Q24H  . feeding supplement (PRO-STAT SUGAR FREE 64)  30 mL Per Tube BID  . free water  200 mL Per Tube Q6H  . insulin aspart  0-20 Units Subcutaneous 6 times per day  . isosorbide dinitrate  10 mg Oral BID  . magnesium oxide  400 mg Oral Daily  . pantoprazole sodium  40 mg Per Tube QHS  . pneumococcal 23 valent vaccine  0.5 mL Intramuscular Tomorrow-1000  . polyethylene glycol  17 g Oral Daily  . polyvinyl  alcohol  1 drop Both Eyes BID  . potassium chloride  10 mEq Intravenous Q1 Hr x 2  . senna-docusate  1 tablet Oral BID   PRN:  fentaNYL, hydrALAZINE, labetalol, midazolam  Diet:  NPO  Activity:  As tolerated DVT Prophylaxis:  Lovenox 40 mg sq daily   CLINICALLY SIGNIFICANT STUDIES Basic Metabolic Panel:   Recent Labs Lab 10/12/13 0640 10/13/13 0505  NA 148* 147  K 4.0 3.6*  CL 109 108  CO2 28 26  GLUCOSE 144* 150*  BUN 30* 27*  CREATININE 0.98 1.03  CALCIUM 9.5 9.5  MG 2.2 2.2  PHOS 3.5 3.7   Liver Function Tests:  No results found for this basename: AST, ALT, ALKPHOS, BILITOT, PROT, ALBUMIN,  in the last 168 hours CBC:   Recent Labs Lab 10/07/13 0500  10/12/13 0640 10/13/13 0505  WBC 7.4  < > 12.3* 13.3*  NEUTROABS 4.0  --   --   --   HGB 10.8*  < > 10.6* 11.0*  HCT 33.9*  < > 34.1* 35.2*  MCV 81.5  < > 83.0 82.1  PLT 183  < > 228  224  < > = values in this interval not displayed. Coagulation:  No results found for this basename: LABPROT, INR,  in the last 168 hours Cardiac Enzymes:   Recent Labs Lab 10/09/13 1018  CKTOTAL 45  CKMB <1.0   Urinalysis:  No results found for this basename: COLORURINE, APPERANCEUR, LABSPEC, PHURINE, GLUCOSEU, HGBUR, BILIRUBINUR, KETONESUR, PROTEINUR, UROBILINOGEN, NITRITE, LEUKOCYTESUR,  in the last 168 hours Lipid Panel    Component Value Date/Time   CHOL 134 10/05/2013 0500   TRIG 132 10/11/2013 0522   HDL 37* 10/05/2013 0500   CHOLHDL 3.6 10/05/2013 0500   VLDL 30 10/05/2013 0500   LDLCALC 67 10/05/2013 0500   HgbA1C  Lab Results  Component Value Date   HGBA1C 8.2* 10/02/2013    Urine Drug Screen:     Component Value Date/Time   LABOPIA NONE DETECTED 10/01/2013 1915   COCAINSCRNUR NONE DETECTED 10/01/2013 1915   COCAINSCRNUR NEG 12/16/2008 2246   LABBENZ NONE DETECTED 10/01/2013 1915   LABBENZ NEG 12/16/2008 2246   AMPHETMU NONE DETECTED 10/01/2013 1915   AMPHETMU NEG 12/16/2008 2246   THCU NONE DETECTED 10/01/2013 1915    LABBARB NONE DETECTED 10/01/2013 1915    Alcohol Level:  No results found for this basename: ETH,  in the last 168 hours  CT of the brain   2/27/20151. Overall slight interval decrease in size of left temporoparietal  intraparenchymal hemorrhage now measuring 3.4 x 2.6 cm, previously 3.5 x 2.9 cm. Similar localized mass effect without midline shift. 2. No new intracranial hemorrhage or large vessel territory infarct. 3. Slightly improved paranasal sinus disease.  10/08/2013 Interval increase in size of left temporoparietal intraparenchymal hematoma, was 2.2 x 3.1 cm, now 2.9 x 3.5 cm concerning for rebleed/ propagation of hemorrhagic stroke. Local mass effect without midline shift. No acute large vascular territory.  10/05/2013 Contracting left temporoparietal intraparenchymal hematoma was 2.7 x 3.8 cm, now 2.2 x 3.1 cm without rebleed. 10/03/2013 Contracting left temporal parietal intraparenchymal hematoma, no rebleed. Moderate white matter changes suggest chronic small vessel ischemic disease. 10/02/2013   Limited portable CT: Skullbase and posterior fossa not been included.  Evolving left temporoparietal intraparenchymal hematoma with similar mass effect. No new hemorrhage.   10/01/2013    1. 4 x 3 cm left temporal/parietal parenchymal hematoma. 2. 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 3. Diffuse inflammatory paranasal sinus disease.     MRI/MRA head pacemaker  2D Echo There is a mass present in the right atrium and right ventricle. It is not seen in the IVC. It was not present on the study of May, 2014. Considering all factors, the most likely etiology is clot. The mass is seen in the RA. There is also mass that moves across the tricuspid valve. There is probably also mass in the RV cavity. I can not be sure, but all of these targets might be connected.  Carotid Doppler (on hold d/t IJ)  LE Venous Dopplers There is no evidence of DVT or SVT in the bilateral lower  extremities.  CT angio chest 1. Right lower lobar pulmonary embolus. 2. Left upper lobe on bilateral lower lobe airspace disease. While this likely represents atelectasis, early infection is not excluded. 3. Mild cardiomegaly and atherosclerotic disease. 4. Support apparatus as described.  CXR   10/12/2013 Low lung volumes with left basilar atelectasis. Support apparatuses as described. 10/11/2013 1. Endotracheal tube tip 5.1 cm above the carina. Central line and NG tube in stable position. 2. Persistent bibasilar infiltrates  and/or atelectasis. No change.  10/10/2013 1. Line and tube position stable. 2. Severe stable cardiomegaly. 3. Slight clearing of mild bibasilar atelectasis and/or infiltrates. 10/09/2013 Cardiomegaly without edema. No change in bibasilar atelectasis. Left IJ catheter has backed out slightly with the tip now just in  the superior vena cava. 10/07/2013 Enlargement of cardiac silhouette. Mild bibasilar atelectasis with improved aeration at right base versus previous study. 10/07/2013 Suboptimal exam due to positioning. Persistent low lung volumes and right lower lung opacity. 10/06/2013 Increased right base atelectasis and effusion. 10/05/2013 Stable cardiomegaly, bibasilar airspace opacities with improved  aeration of left lung base. Probable right middle lobe collapse. Small residual pleural effusions.  10/04/2013 Decreased right basilar atelectasis. Otherwise no significant  change. 10/03/2013 Fairly small right pleural effusion with right base atelectasis. Lungs are otherwise clear. Tube and catheter positions as described without pneumothorax. 10/02/2013    Endotracheal tube 4 cm above the carina. Left central line tip projected laterally towards the lateral wall of the upper SVC. No pneumothorax.  Cardiomegaly with vascular congestion.  Small effusions and bibasilar atelectasis.    10/01/2013   The appearance of the pulmonary interstitium suggests mild interstitial edema likely  secondary to CHF. There is no evidence of pneumonia or pleural effusion.     EKG  Sinus or ectopic atrial rhythm. Prolonged PR interval. Anterior infarct, old. No significant change was found.   Therapy Recommendations   Physical Exam General: The patient is obese. He is intubated. Cardiovascular: Distant heart sounds, regular rate. Abdomen: Obese, nontender, bowel sounds present but diminished. Skin: No significant peripheral edema is noted. 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  Neurologic Exam Mental status: The patient is intubated and sedated. Aphasic and will not follow commands. Cranial nerves: Opens eyes partially to auditory stimuli. Facial symmetry is present. The patient is intubated, sedated. Eyes are deviated to the left, no blink to threat. Some facial grimace is noted with pain stimulation. The patient will open his eyes with stimulation. Motor: The patient has withdrawal movements on all 4 extremities. Sensory examination: The patient will respond to deep pain stimulation with withdrawal on all 4 extremities. Coordination: Cerebellar testing cannot be performed. Gait and station: The patient is on bedrest, the patient could not be ambulated. Reflexes: Deep tendon reflexes are symmetric, but are depressed.   ASSESSMENT [name] is a 68 y.o. male presenting with altered speech. Imaging confirms a left temporal parietal IPH with significant surrounding edema - no hydrocephalus, no [name]. Suspect underlying mass or ischemic infarct given amt of edema, possible vegetation. Unable to get MRI to confirm due to pacer. Slight increase in hemmorrhage size 10/08/2013. But stable on Ct head 10/12/13. Has respiratory insufficiency due to PE from mobile mass (clot vs vegetation) found last week on pacer wire in RA extending into RV. Not an anticoagulation candidate for treatment due to [place].  Placed on empiric abs for possible endocarditis/RA vegetation on wire - abx to stop 2/26. On  aspirin 81 mg orally every day prior to admission. Patient purposeful, localizes; global aphasia, right hemirparesis, dysphagia.  Respiratory failure, intubated  due to respiratory insufficiency, low sats after admission. Extubated/reintubated 2/18 am due to pulmonary edema vs aspiration pneumonia. Started on vanc and zosyn. Suspected OSA/OHS. Unable to wean. RLL pulmonary embolus, not a anticoagulation candidate due to hemorrhage. PE from mobile mass.  obstructive sleep apnea  atrial fibrillation, not a coumadin candidate secondary to hemorrhage. Not on prior to admission due to hx rectal bleeding. hypertension Hyperlipidemia,  on crestor 40 mg daily PTA, now on no statin as NPO  Pacemaker. intermittant CHB with appropriate PPM function.  CAD  Non-ischemic cardiomyopathy  Diabetes, uncontrolled, HgbA1c 8.2, goal 7.0 Obesity, Body mass index is 37.84 kg/(m^2).   Legally blind  Herpes  Chronic kidney disease, stage 3  Hypokalemia, K 4.0  Hospital day # 12  TREATMENT/PLAN  Continue ICU level care  S/p Trach and PEG   Restart TF  On TCT today, tolerating well.   Restart ASA 81mg   Cool blanket temp less 99.5  Tylenol.   Scheduled to be d/c to [facility] as per daughter which will likely happen early next week  No anticoagulation at this time.   - s/p d/c antibiotics  SBP <160  Case d/w with pt's daughter at bedside.

## 2013-10-14 ENCOUNTER — Inpatient Hospital Stay (HOSPITAL_COMMUNITY): Payer: Medicare Other

## 2013-10-14 LAB — CBC
HEMATOCRIT: 35.4 % — AB (ref 39.0–52.0)
Hemoglobin: 11 g/dL — ABNORMAL LOW (ref 13.0–17.0)
MCH: 25.8 pg — ABNORMAL LOW (ref 26.0–34.0)
MCHC: 31.1 g/dL (ref 30.0–36.0)
MCV: 83.1 fL (ref 78.0–100.0)
Platelets: 194 10*3/uL (ref 150–400)
RBC: 4.26 MIL/uL (ref 4.22–5.81)
RDW: 16.3 % — AB (ref 11.5–15.5)
WBC: 12.7 10*3/uL — ABNORMAL HIGH (ref 4.0–10.5)

## 2013-10-14 LAB — GLUCOSE, CAPILLARY
GLUCOSE-CAPILLARY: 141 mg/dL — AB (ref 70–99)
GLUCOSE-CAPILLARY: 162 mg/dL — AB (ref 70–99)
GLUCOSE-CAPILLARY: 170 mg/dL — AB (ref 70–99)
GLUCOSE-CAPILLARY: 182 mg/dL — AB (ref 70–99)
GLUCOSE-CAPILLARY: 195 mg/dL — AB (ref 70–99)
Glucose-Capillary: 162 mg/dL — ABNORMAL HIGH (ref 70–99)
Glucose-Capillary: 153 mg/dL — ABNORMAL HIGH (ref 70–99)

## 2013-10-14 LAB — BASIC METABOLIC PANEL
BUN: 32 mg/dL — AB (ref 6–23)
CO2: 27 meq/L (ref 19–32)
Calcium: 9.4 mg/dL (ref 8.4–10.5)
Chloride: 111 mEq/L (ref 96–112)
Creatinine, Ser: 1.03 mg/dL (ref 0.50–1.35)
GFR calc Af Amer: 86 mL/min — ABNORMAL LOW (ref >90)
GFR, EST NON AFRICAN AMERICAN: 74 mL/min — AB (ref >90)
Glucose, Bld: 199 mg/dL — ABNORMAL HIGH (ref 70–99)
Potassium: 3.8 mEq/L (ref 3.7–5.3)
Sodium: 150 mEq/L — ABNORMAL HIGH (ref 137–147)

## 2013-10-14 LAB — PHOSPHORUS: Phosphorus: 3 mg/dL (ref 2.3–4.6)

## 2013-10-14 LAB — TRIGLYCERIDES: Triglycerides: 80 mg/dL (ref <150)

## 2013-10-14 LAB — MAGNESIUM: Magnesium: 2.4 mg/dL (ref 1.5–2.5)

## 2013-10-14 MED ORDER — ONDANSETRON HCL 4 MG/2ML IJ SOLN
INTRAMUSCULAR | Status: AC
  Administered 2013-10-14: 4 mg
  Filled 2013-10-14: qty 2

## 2013-10-14 MED ORDER — ONDANSETRON HCL 4 MG/2ML IJ SOLN
4.0000 mg | Freq: Four times a day (QID) | INTRAMUSCULAR | Status: DC | PRN
  Administered 2013-10-14: 4 mg via INTRAVENOUS
  Filled 2013-10-14: qty 2

## 2013-10-14 MED ORDER — ONDANSETRON HCL 4 MG/2ML IJ SOLN
4.0000 mg | Freq: Four times a day (QID) | INTRAMUSCULAR | Status: DC
Start: 2013-10-14 — End: 2013-10-14

## 2013-10-14 MED ORDER — DEXTROSE 5 % IV SOLN
INTRAVENOUS | Status: DC
  Administered 2013-10-14: 50 mL via INTRAVENOUS

## 2013-10-14 NOTE — Progress Notes (Signed)
Patient vomited approximately 150 ml of thick, clear secretions. Dr. Irish Elders alerted and viewed emesis. Orders for Zofran 4 mg ivp every 6 hours prn ordered. Zofran given with satisfactory results per patient. Will continue to monitor.

## 2013-10-14 NOTE — Progress Notes (Signed)
Stroke Team Progress Note  HISTORY Jontavis Weister is an 68 y.o. male who lives with a roommate. Roommate felt that patient was not at his baseline today and this morning felt hat he was hallucinating. He went to his day program where they noticed that he was not talking right. EMS was called at that time. Last known well 09/30/2013 at 22:30. Patient was not administerd TPA secondary to delay in arrival. He was admitted to the neuro ICU for further evaluation and treatment.  SUBJECTIVE S/p Trach and PEG. Tolerated well. Has been on TCT all night. Tube feeds started. Sites look acceptable.   OBJECTIVE Most recent Vital Signs: Filed Vitals:   10/14/13 0600 10/14/13 0700 10/14/13 0714 10/14/13 0759  BP: 170/88 157/81 157/81   Pulse: 70 70 70   Temp:    98.3 F (36.8 C)  TempSrc:    Oral  Resp: 20 27 26    Height:      Weight:      SpO2: 98% 96% 98%    CBG (last 3)   Recent Labs  10/13/13 1958 10/13/13 2337 10/14/13 0403  GLUCAP 148* 195* 170*    IV Fluid Intake:   . sodium chloride 20 mL/hr at 10/12/13 2100  . sodium chloride    . feeding supplement (VITAL HIGH PROTEIN) 1,000 mL (10/14/13 0128)    MEDICATIONS  . amiodarone  200 mg Oral Daily  . antiseptic oral rinse  15 mL Mouth Rinse QID  . aspirin EC  81 mg Oral Daily  . atorvastatin  10 mg Oral q1800  . brimonidine  1 drop Both Eyes TID  . carvedilol  25 mg Per Tube BID WC  . chlorhexidine  15 mL Mouth Rinse BID  . cloNIDine  0.1 mg Per Tube BID  . dorzolamide-timolol  1 drop Both Eyes BID  . enoxaparin (LOVENOX) injection  40 mg Subcutaneous Q24H  . feeding supplement (PRO-STAT SUGAR FREE 64)  30 mL Per Tube BID  . free water  200 mL Per Tube Q6H  . insulin aspart  0-20 Units Subcutaneous 6 times per day  . isosorbide dinitrate  10 mg Oral BID  . magnesium oxide  400 mg Oral Daily  . pantoprazole sodium  40 mg Per Tube QHS  . pneumococcal 23 valent vaccine  0.5 mL Intramuscular Tomorrow-1000  . polyethylene  glycol  17 g Oral Daily  . polyvinyl alcohol  1 drop Both Eyes BID  . senna-docusate  1 tablet Oral BID   PRN:  acetaminophen, fentaNYL, hydrALAZINE, labetalol, midazolam  Diet:  NPO  Activity:  As tolerated DVT Prophylaxis:  Lovenox 40 mg sq daily   CLINICALLY SIGNIFICANT STUDIES Basic Metabolic Panel:   Recent Labs Lab 10/13/13 0505 10/14/13 0330  NA 147 150*  K 3.6* 3.8  CL 108 111  CO2 26 27  GLUCOSE 150* 199*  BUN 27* 32*  CREATININE 1.03 1.03  CALCIUM 9.5 9.4  MG 2.2 2.4  PHOS 3.7 3.0   Liver Function Tests:  No results found for this basename: AST, ALT, ALKPHOS, BILITOT, PROT, ALBUMIN,  in the last 168 hours CBC:   Recent Labs Lab 10/13/13 0505 10/14/13 0330  WBC 13.3* 12.7*  HGB 11.0* 11.0*  HCT 35.2* 35.4*  MCV 82.1 83.1  PLT 224 194   Coagulation:  No results found for this basename: LABPROT, INR,  in the last 168 hours Cardiac Enzymes:   Recent Labs Lab 10/09/13 1018  CKTOTAL 45  CKMB <1.0  Urinalysis:  No results found for this basename: COLORURINE, APPERANCEUR, LABSPEC, [place], GLUCOSEU, HGBUR, BILIRUBINUR, KETONESUR, PROTEINUR, UROBILINOGEN, NITRITE, LEUKOCYTESUR,  in the last 168 hours Lipid Panel    Component Value Date/Time   CHOL 134 10/05/2013 0500   TRIG 80 10/14/2013 0330   HDL 37* 10/05/2013 0500   CHOLHDL 3.6 10/05/2013 0500   VLDL 30 10/05/2013 0500   LDLCALC 67 10/05/2013 0500   HgbA1C  Lab Results  Component Value Date   HGBA1C 8.2* 10/02/2013    Urine Drug Screen:     Component Value Date/Time   LABOPIA NONE DETECTED 10/01/2013 1915   COCAINSCRNUR NONE DETECTED 10/01/2013 1915   COCAINSCRNUR NEG 12/16/2008 2246   LABBENZ NONE DETECTED 10/01/2013 1915   LABBENZ NEG 12/16/2008 2246   AMPHETMU NONE DETECTED 10/01/2013 1915   AMPHETMU NEG 12/16/2008 2246   THCU NONE DETECTED 10/01/2013 1915   LABBARB NONE DETECTED 10/01/2013 1915    Alcohol Level:  No results found for this basename: ETH,  in the last 168 hours  CT of the brain    2/27/20151. Overall slight interval decrease in size of left temporoparietal  intraparenchymal hemorrhage now measuring 3.4 x 2.6 cm, previously 3.5 x 2.9 cm. Similar localized mass effect without midline shift. 2. No new intracranial hemorrhage or large vessel territory infarct. 3. Slightly improved paranasal sinus disease.  10/08/2013 Interval increase in size of left temporoparietal intraparenchymal hematoma, was 2.2 x 3.1 cm, now 2.9 x 3.5 cm concerning for rebleed/ propagation of hemorrhagic stroke. Local mass effect without midline shift. No acute large vascular territory.  10/05/2013 Contracting left temporoparietal intraparenchymal hematoma was 2.7 x 3.8 cm, now 2.2 x 3.1 cm without rebleed. 10/03/2013 Contracting left temporal parietal intraparenchymal hematoma, no rebleed. Moderate white matter changes suggest chronic small vessel ischemic disease. 10/02/2013   Limited portable CT: Skullbase and posterior fossa not been included.  Evolving left temporoparietal intraparenchymal hematoma with similar mass effect. No new hemorrhage.   10/01/2013    1. 4 x 3 cm left temporal/parietal parenchymal hematoma. 2. 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 3. Diffuse inflammatory paranasal sinus disease.     MRI/MRA head pacemaker  2D Echo There is a mass present in the right atrium and right ventricle. It is not seen in the IVC. It was not present on the study of May, 2014. Considering all factors, the most likely etiology is clot. The mass is seen in the RA. There is also mass that moves across the tricuspid valve. There is probably also mass in the RV cavity. I can not be sure, but all of these targets might be connected.  Carotid Doppler (on hold d/t IJ)  LE Venous Dopplers There is no evidence of DVT or SVT in the bilateral lower extremities.  CT angio chest 1. Right lower lobar pulmonary embolus. 2. Left upper lobe on bilateral lower lobe airspace disease. While this likely  represents atelectasis, early infection is not excluded. 3. Mild cardiomegaly and atherosclerotic disease. 4. Support apparatus as described.  CXR   10/12/2013 Low lung volumes with left basilar atelectasis. Support apparatuses as described. 10/11/2013 1. Endotracheal tube tip 5.1 cm above the carina. Central line and NG tube in stable position. 2. Persistent bibasilar infiltrates and/or atelectasis. No change.  10/10/2013 1. Line and tube position stable. 2. Severe stable cardiomegaly. 3. Slight clearing of mild bibasilar atelectasis and/or infiltrates. 10/09/2013 Cardiomegaly without edema. No change in bibasilar atelectasis. Left IJ catheter has backed out slightly with the tip  now just in  the superior vena cava. 10/07/2013 Enlargement of cardiac silhouette. Mild bibasilar atelectasis with improved aeration at right base versus previous study. 10/07/2013 Suboptimal exam due to positioning. Persistent low lung volumes and right lower lung opacity. 10/06/2013 Increased right base atelectasis and effusion. 10/05/2013 Stable cardiomegaly, bibasilar airspace opacities with improved  aeration of left lung base. Probable right middle lobe collapse. Small residual pleural effusions.  10/04/2013 Decreased right basilar atelectasis. Otherwise no significant  change. 10/03/2013 Fairly small right pleural effusion with right base atelectasis. Lungs are otherwise clear. Tube and catheter positions as described without pneumothorax. 10/02/2013    Endotracheal tube 4 cm above the carina. Left central line tip projected laterally towards the lateral wall of the upper SVC. No pneumothorax.  Cardiomegaly with vascular congestion.  Small effusions and bibasilar atelectasis.    10/01/2013   The appearance of the pulmonary interstitium suggests mild interstitial edema likely secondary to CHF. There is no evidence of pneumonia or pleural effusion.     EKG  Sinus or ectopic atrial rhythm. Prolonged PR interval. Anterior infarct,  old. No significant change was found.   Therapy Recommendations   Physical Exam General: The patient is obese. He is intubated. Cardiovascular: Distant heart sounds, regular rate. Abdomen: Obese, nontender, bowel sounds present but diminished. Skin: No significant peripheral edema is noted. 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  Neurologic Exam Mental status: The patient is intubated and sedated. Aphasic and will not follow commands. Cranial nerves: Opens eyes partially to auditory stimuli. Facial symmetry is present. The patient is intubated, sedated. Eyes are deviated to the left, no blink to threat. Some facial grimace is noted with pain stimulation. The patient will open his eyes with stimulation. Motor: The patient has withdrawal movements on all 4 extremities. Sensory examination: The patient will respond to deep pain stimulation with withdrawal on all 4 extremities. Coordination: Cerebellar testing cannot be performed. Gait and station: The patient is on bedrest, the patient could not be ambulated. Reflexes: Deep tendon reflexes are symmetric, but are depressed.   ASSESSMENT Mr. Sampson Lyndaker is a 68 y.o. male presenting with altered speech. Imaging confirms a left temporal parietal IPH with significant surrounding edema - no hydrocephalus, no [name]. Suspect underlying mass or ischemic infarct given amt of edema, possible vegetation. Unable to get MRI to confirm due to pacer. Slight increase in hemmorrhage size 10/08/2013. But stable on Ct head 10/12/13. Has respiratory insufficiency due to PE from mobile mass (clot vs vegetation) found last week on pacer wire in RA extending into RV. Not an anticoagulation candidate for treatment due to [place].  Placed on empiric abs for possible endocarditis/RA vegetation on wire - abx to stop 2/26. On aspirin 81 mg orally every day prior to admission. Patient purposeful, localizes; global aphasia, right hemirparesis, dysphagia.  Respiratory failure,  intubated  due to respiratory insufficiency, low sats after admission. Extubated/reintubated 2/18 am due to pulmonary edema vs aspiration pneumonia. Started on vanc and zosyn. Suspected OSA/OHS. Unable to wean. RLL pulmonary embolus, not a anticoagulation candidate due to hemorrhage. PE from mobile mass.  obstructive sleep apnea  atrial fibrillation, not a coumadin candidate secondary to hemorrhage. Not on prior to admission due to hx rectal bleeding. hypertension  Hyperlipidemia, on crestor 40 mg daily PTA  Pacemaker. intermittant CHB with appropriate PPM function.  CAD  Non-ischemic cardiomyopathy  Diabetes, uncontrolled, HgbA1c 8.2, goal 7.0 Obesity, Body mass index is 37.16 kg/(m^2).   Legally blind  Herpes  Chronic kidney disease,  stage 3  Hypokalemia, K 4.0  Trach/Peg 2/28 now [facility] day # 13  TREATMENT/PLAN  S/p Trach and PEG, tolerated TCT most of day and overnight yesterday. Can d/c vent  TF restarted.   ASA 81mg   Cool blanket temp less 99.5  Tylenol.   Scheduled to be d/c to [facility] as per daughter which will likely happen early next week. Social work following.   Hypernatremia, free water flushes at 200 q6 hrs  No anticoagulation at this time.   -s/p d/c antibiotics  SBP <160   Case d/w with pt's daughter at bedside yesterday

## 2013-10-14 NOTE — Progress Notes (Signed)
PULMONARY / CRITICAL CARE MEDICINE  Name: [name] MRN: [id number] DOB: 09-05-1945   LOS 13  days   ADMISSION DATE:  10/01/2013 CONSULTATION DATE:  10/02/2013  REFERRING MD :  Stroke PRIMARY SERVICE: Stroke  CHIEF COMPLAINT:  Acute respiratory failure   BRIEF PATIENT DESCRIPTION: 68 year old male with multiple co-morbids including suspected OSA/OHS. Admitted for left temporal / parietal ICH on 2/16. Developed decrease LOC and progressive respiratory failure early in the am on 2/17. PCCM asked to see emergently in consult.   SIGNIFICANT EVENTS / STUDIES:  2/16  Head CT >>> 1. 4 x 3 cm left temporal/parietal parenchymal hematoma. 2. 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 3. Diffuse inflammatory paranasal sinus disease. 2/17  Head CT >>>  Evolving left temporoparietal intraparenchymal hematoma with similar mass effect. No new hemorrhage. 2/18  Head CT >>>  Contracting left temporal parietal intraparenchymal hematoma, no rebleed. Moderate white matter changes suggest chronic small vessel ischemic disease. 2/18  Extubated / re-intubated due to pulmonary edema vs aspiration pneumonia vs other 2/19  TTE >>> mobile mass in RA on the pacer wire, likely clot (vs vegetation) 2/19  CT-PA >> R LL acute PE, LUL and BLL atx vs infiltrates 10/08/13 - NEURO: increase size of Left temp/parietal hemorrhage to 3.5cm without midline shift. Neuro advises holding off anticoagulation for one more week at the least  10/08/13: CARDS: Mural thrombus of heart: TEE confirms mass in the right side of the heart. This is a large mass consistent with clot on the pacemaker lead and prolapsing. No options for intervention at this point and he is not a anticoagulation candidate. No DVT. No indication for filter. 2/25 Spoke with daughter, ok to proceed with trach/peg, plan trach on Friday at 31 AM. 2/27 Spoke with Dr. Leonie Man, does not wish to start any anticoagulation short of DVT prophylaxis  lovenox. 2/28 NAD on t collar  LINES / TUBES: L IJ CVL 2/17 >>> OETT 2/17 >>> 2/18; 2/18 >>>2/27 Trach (JY) 2/27>>> OGT 1/17 >>> 2/18; 2/18 >>> Foley 2/17 >>>  CULTURES: MRSA PCR 2/16 - negative Blood 2/18 >> ngtd  resp 2/19 >> oropharyngeal flora  ANTIBIOTICS: Zosyn 2/18 >>>2/22 Vancomycin 2/18 >>>2/22 ceftriaxone 2/22>>> 2/26  SUBJECTIVE:   Follows some commands On ATC Secretions ++ Vomiting x1  VITAL SIGNS: Temp:  [97.8 F (36.6 C)-99 F (37.2 C)] 98.3 F (36.8 C) (03/01 0759) Pulse Rate:  [69-85] 70 (03/01 0800) Resp:  [17-31] 26 (03/01 0800) BP: (107-177)/(60-88) 177/87 mmHg (03/01 0800) SpO2:  [93 %-100 %] 98 % (03/01 0800) FiO2 (%):  [28 %-35 %] 28 % (03/01 0900) Weight:  [124.3 kg (274 lb 0.5 oz)] 124.3 kg (274 lb 0.5 oz) (03/01 0402)  HEMODYNAMICS:   VENTILATOR SETTINGS: Vent Mode:  [-]  FiO2 (%):  [28 %-35 %] 28 %  INTAKE / OUTPUT: Intake/Output     02/28 0701 - 03/01 0700 03/01 0701 - 03/02 0700   I.V. (mL/kg) 480 (3.9) 40 (0.3)   Other 250    NG/GT 1560 65   Total Intake(mL/kg) 2290 (18.4) 105 (0.8)   Urine (mL/kg/hr) 1360 (0.5) 260 (0.7)   Total Output 1360 260   Net +930 -155         PHYSICAL EXAMINATION: General:  Chronically ill appearing, On t collar , follows some commands  Neuro: Eyes open,  following commands. HEENT: trach->tcollar Cardiovascular:  Regular, no m/g/r noted. Lungs:  Bilateral air entry, few rhonchi. T collar in place. Abdomen:  Obese, distend, TF via peg on hold  Musculoskeletal:  Trace edema. Skin:  Intact.  LABS:  PULMONARY  Recent Labs Lab 10/11/13 0500 10/12/13 0421 10/13/13 0410  PHART 7.447 7.510* 7.477*  PCO2ART 38.4 34.2* 34.9*  PO2ART 91.4 86.0 96.8  HCO3 26.1* 27.3* 25.3*  TCO2 27.3 28 26.3  O2SAT 97.1 97.0 97.6    CBC  Recent Labs Lab 10/12/13 0640 10/13/13 0505 10/14/13 0330  HGB 10.6* 11.0* 11.0*  HCT 34.1* 35.2* 35.4*  WBC 12.3* 13.3* 12.7*  PLT 228 224 194   COAGULATION No  results found for this basename: INR,  in the last 168 hours  CARDIAC  No results found for this basename: TROPONINI,  in the last 168 hours  Recent Labs Lab 10/09/13 0410  PROBNP 378.9*   CHEMISTRY  Recent Labs Lab 10/10/13 0525 10/11/13 0500 10/12/13 0640 10/13/13 0505 10/14/13 0330  NA 146 148* 148* 147 150*  K 3.3* 3.2* 4.0 3.6* 3.8  CL 109 108 109 108 111  CO2 25 26 28 26 27   GLUCOSE 211* 220* 144* 150* 199*  BUN 35* 31* 30* 27* 32*  CREATININE 1.11 1.04 0.98 1.03 1.03  CALCIUM 9.3 9.4 9.5 9.5 9.4  MG 2.2 2.3 2.2 2.2 2.4  PHOS 2.7 3.2 3.5 3.7 3.0   Estimated Creatinine Clearance: 97.4 ml/min (by C-G formula based on Cr of 1.03).  LIVER No results found for this basename: AST, ALT, ALKPHOS, BILITOT, PROT, ALBUMIN, INR,  in the last 168 hours INFECTIOUS  Recent Labs Lab 10/10/13 0525  LATICACIDVEN 1.2   ENDOCRINE CBG (last 3)   Recent Labs  10/13/13 2337 10/14/13 0403 10/14/13 0757  GLUCAP 195* 170* 162*   IMAGING x48h  Dg Chest Port 1 View  10/14/2013   CLINICAL DATA:  Tracheostomy.  Diabetes.  Obstructive sleep apnea.  EXAM: PORTABLE CHEST - 1 VIEW  COMPARISON:  DG CHEST 1V PORT dated 10/13/2013  FINDINGS: Two frontal radiographs. Tracheostomy appropriate position. Patient rotated left. Left internal jugular line terminates at the high SVC. Pacer with leads at right atrium and right ventricle. No lead discontinuity.  Cardiomegaly accentuated by AP portable technique. Possible small left pleural effusion. No pneumothorax. No congestive failure. Improved aeration with decreased right and similar left base airspace opacities.  IMPRESSION: Cardiomegaly with slightly improved aeration. Patchy bibasilar atelectasis remains.  Possible small left pleural effusion.   Electronically Signed   By: Abigail Miyamoto M.D.   On: 10/14/2013 07:26   Dg Chest Port 1 View  10/13/2013   CLINICAL DATA:  Tracheostomy  EXAM: PORTABLE CHEST - 1 VIEW  COMPARISON:  the previous day's  study  FINDINGS: Tracheostomy device is partially seen, appears stable in position. Left IJ central line directed towards the lateral wall of the SVC. Right subclavian pacemaker leads stable. Moderate cardiomegaly as before. Low lung volumes with infrahilar atelectasis/ consolidation left greater than right as before, crowding of perihilar bronchovascular markings. . No effusion. Visualized skeletal structures are unremarkable.  IMPRESSION: Little change since previous day's exam   Electronically Signed   By: Arne Cleveland M.D.   On: 10/13/2013 08:24   Chest Portable 1 View To Assess Tube Placement And Rule-out Pneumothorax  10/12/2013   CLINICAL DATA:  Tracheostomy tube placement  EXAM: PORTABLE CHEST - 1 VIEW  COMPARISON:  10/12/2013  FINDINGS: There is a tracheostomy tube in place. No diagnostic pneumothorax. Cardiomegaly again noted. NG tube has been removed. Stable left IJ central line position. Persistent bilateral basilar atelectasis or  infiltrate. No pulmonary edema.  IMPRESSION: Tracheostomy tube in place. No diagnostic pneumothorax. Bilateral basilar atelectasis or infiltrate. No pulmonary edema.   Electronically Signed   By: Lahoma Crocker M.D.   On: 10/12/2013 13:36   ASSESSMENT / PLAN:  PULMONARY A:  Acute respiratory failure, re intubated 2/18 due to PE from Right atrial lead clot Possible PNA vs atelectasis Acute RLL PE Suspected OSA/OHS Duplex LE negative for DVT 10/08/13 Trached 2/27  P:   - T collar as tolerated - No full anti-coag given ICH, neuro ok with lovenox.  - Continue SCDs but no IVC filter. - ATC. As tolerated  CARDIOVASCULAR A:   HTN Chronic diastolic heart failure Transient hypotension after sedation / intubation, resolved H/o CAD, AF, non-ischemic cardiomyopathy, HTN R atrial mass, Mural thrombus- attached to pacer wire- Looking less likely this is endocarditis P:  - Per Cards. - Goal SBP <160 - at goal. - Empiric abx for possibility of endocarditis/RA  vegetation; stopped, last dose is 2/26. - Not a candicate for thrombectomy when he cannot be heparinized or direct t-PA. - Hold ASA. - Labetalol PRN.  RENAL Lab Results  Component Value Date   CREATININE 1.03 10/14/2013   CREATININE 1.03 10/13/2013   CREATININE 0.98 10/12/2013   CREATININE 2.28* 04/23/2013   CREATININE 0.97 10/22/2010    A:   CKD stage 3 Hypernatremia   P:   - Trend BMP. - Free water as ordered.(on hold 3/1 for vomiting)  GASTROINTESTINAL A:   Obesity Mild elevated LFTs, resolved GERD on PPI preadmission Nutrition Vomiting 3/1 P:   - Hold TF  Today 3/1. -zofran as needed -Abd film ro ileus - Protonix. -IVF till gut avaialble  HEMATOLOGIC A:   Mild anemia  dilution P:  - Trend CBC, hgb stable. - PRBC for hgb < 7gm%.  INFECTIOUS A:   Possible pneumonia vs atx, Pct is equivocal. TEE 10/08/13: Mural thrombus and  Not endocarditis  P:   - Ended ceftriaxone on 10/11/13.  ENDOCRINE A:   DM  P:   - SSI.  NEUROLOGIC A: Left temp/parietal ICH w associated edema Acute encephalopathy Worsening hemorrhage 10/08/13 imaging of brain  P:   - Goal RASS 0 to -1. - d/c all sedation . - Fentanyl PRN. - No anticoagulation atleast till 10/14/13 and then reassess.  GLOBAL Cont  t collar, LTAC planned, need advance directives here  Richardson Landry Minor ACNP Maryanna Shape PCCM Pager [phone] till 3 pm If no answer page [phone]  Care during the described time interval was provided by me and/or other providers on the critical care team.  I have reviewed this patient's available data, including medical history, events of note, physical examination and test results as part of my evaluation  CC time x 80m  Wana Mount V.

## 2013-10-14 NOTE — Progress Notes (Signed)
Orthopedic Tech Progress Note Patient Details:  [name] 03-07-1946 [id number] 2 abdominal binders ordered for patient use. Original binder soiled. Ortho Devices Type of Ortho Device: Abdominal binder Ortho Device/Splint Interventions: Ordered   [place] R Thompson 10/14/2013, 4:11 PM

## 2013-10-15 LAB — GLUCOSE, CAPILLARY
GLUCOSE-CAPILLARY: 170 mg/dL — AB (ref 70–99)
GLUCOSE-CAPILLARY: 137 mg/dL — AB (ref 70–99)
GLUCOSE-CAPILLARY: 178 mg/dL — AB (ref 70–99)
Glucose-Capillary: 213 mg/dL — ABNORMAL HIGH (ref 70–99)
Glucose-Capillary: 175 mg/dL — ABNORMAL HIGH (ref 70–99)

## 2013-10-15 LAB — BASIC METABOLIC PANEL
BUN: 31 mg/dL — ABNORMAL HIGH (ref 6–23)
CALCIUM: 9.8 mg/dL (ref 8.4–10.5)
CO2: 29 meq/L (ref 19–32)
CREATININE: 1.09 mg/dL (ref 0.50–1.35)
Chloride: 109 mEq/L (ref 96–112)
GFR calc Af Amer: 80 mL/min — ABNORMAL LOW (ref >90)
GFR calc non Af Amer: 69 mL/min — ABNORMAL LOW (ref >90)
Glucose, Bld: 161 mg/dL — ABNORMAL HIGH (ref 70–99)
Potassium: 3.4 mEq/L — ABNORMAL LOW (ref 3.7–5.3)
SODIUM: 148 meq/L — AB (ref 137–147)

## 2013-10-15 LAB — CBC
HCT: 37.9 % — ABNORMAL LOW (ref 39.0–52.0)
Hemoglobin: 11.5 g/dL — ABNORMAL LOW (ref 13.0–17.0)
MCH: 25.8 pg — AB (ref 26.0–34.0)
MCHC: 30.3 g/dL (ref 30.0–36.0)
MCV: 85 fL (ref 78.0–100.0)
Platelets: 242 10*3/uL (ref 150–400)
RBC: 4.46 MIL/uL (ref 4.22–5.81)
RDW: 16.1 % — AB (ref 11.5–15.5)
WBC: 14.5 10*3/uL — ABNORMAL HIGH (ref 4.0–10.5)

## 2013-10-15 MED ORDER — POTASSIUM CHLORIDE 10 MEQ/50ML IV SOLN
10.0000 meq | INTRAVENOUS | Status: AC
  Administered 2013-10-15 (×2): 10 meq via INTRAVENOUS
  Filled 2013-10-15 (×2): qty 50

## 2013-10-15 NOTE — Progress Notes (Signed)
Pt stats keep dropping periodically into the low 80's, despite suctioning.   O2 on trach collar has been increased to 10L O2 and 60%.

## 2013-10-15 NOTE — Progress Notes (Signed)
Paged Neurology & spoke to Dr. Doy Mince about pt vomiting.  Vomit was clear & frothy and approximately 21mL.   MD ordered for pt to go for a stat CT of the head. Ct has been performed and pt returned to their room.

## 2013-10-15 NOTE — Progress Notes (Signed)
[facility] ADULT ICU REPLACEMENT PROTOCOL FOR AM LAB REPLACEMENT ONLY  The patient does not apply for the [facility] Adult ICU Electrolyte Replacment Protocol based on the criteria listed below:   *  Is urine output >/= 0.5 ml/kg/hr for the last 6 hours? no Patient's UOP is 0.4 ml/kg/hr   Abnormal electrolyte(s): K3.4   If a panic level lab has been reported, has the CCM MD in charge been notified? yes.   Physician:  Arelia Longest, MD  Vear Clock 10/15/2013 6:29 AM

## 2013-10-15 NOTE — Progress Notes (Addendum)
PULMONARY / CRITICAL CARE MEDICINE  Name: [name] MRN: [id number] DOB: 1946-06-05   LOS 14  days   ADMISSION DATE:  10/01/2013 CONSULTATION DATE:  10/02/2013  REFERRING MD :  Stroke PRIMARY SERVICE: Stroke  CHIEF COMPLAINT:  Acute respiratory failure   BRIEF PATIENT DESCRIPTION: 68 year old male with multiple co-morbids including suspected OSA/OHS. Admitted for left temporal / parietal ICH on 2/16. Developed decrease LOC and progressive respiratory failure early in the am on 2/17. PCCM asked to see emergently in consult.   SIGNIFICANT EVENTS / STUDIES:  2/16 - Head CT >>> 1. 4 x 3 cm left temporal/parietal parenchymal hematoma. 2. 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 3. Diffuse inflammatory paranasal sinus disease. 2/17 - Head CT >>>  Evolving left temporoparietal intraparenchymal hematoma with similar mass effect. No new hemorrhage. 2/18 - Head CT >>>  Contracting left temporal parietal intraparenchymal hematoma, no rebleed. Moderate white matter changes suggest chronic small vessel ischemic disease. 2/18 -  Extubated / re-intubated due to pulmonary edema vs aspiration pneumonia vs other 2/19 - TTE >>> mobile mass in RA on the pacer wire, likely clot (vs vegetation) 2/19 - CT-PA >> R LL acute PE, LUL and BLL atx vs infiltrates 2/23 - NEURO: increase size of Left temp/parietal hemorrhage to 3.5cm without midline shift. Neuro advises holding off anticoagulation for one more week at the least  2/23 - CARDS: Mural thrombus of heart: TEE confirms mass in the right side of the heart. This is a large mass consistent with clot on the pacemaker lead and prolapsing. No options for intervention at this point and he is not a anticoagulation candidate. No DVT. No indication for filter. 2/25 - Spoke with daughter, ok to proceed with trach/peg, plan trach on Friday at 21 AM. 2/27 - Spoke with Dr. Leonie Man, does not wish to start any anticoagulation short of DVT prophylaxis  lovenox. 2/28 - NAD on t collar  LINES / TUBES: L IJ CVL 2/17 >>> OETT 2/17 >>> 2/18; 2/18 >>>2/27 Trach (JY) 2/27>>> OGT 1/17 >>> 2/18; 2/18 >>> Foley 2/17 >>>  CULTURES: 2/16 MRSA PCR>>>negative 2/18 Blood>>ngtd  2/19 Resp>>oropharyngeal flora  ANTIBIOTICS: Zosyn 2/18 >>>2/22 Vancomycin 2/18 >>>2/22 ceftriaxone 2/22>>> 2/26  SUBJECTIVE:  Hypokalemia noted.  No distress on ATC.  Mild bloody secretions around trach site  VITAL SIGNS: Temp:  [97.8 F (36.6 C)-99.2 F (37.3 C)] 98.4 F (36.9 C) (03/02 0717) Pulse Rate:  [69-85] 70 (03/02 1000) Resp:  [14-34] 23 (03/02 1000) BP: (121-184)/(58-100) 132/75 mmHg (03/02 1000) SpO2:  [89 %-100 %] 90 % (03/02 1000) FiO2 (%):  [28 %-60 %] 40 % (03/02 1000) Weight:  [281 lb 15.5 oz (127.9 kg)] 281 lb 15.5 oz (127.9 kg) (03/02 0347)  HEMODYNAMICS:   VENTILATOR SETTINGS: Vent Mode:  [-]  FiO2 (%):  [28 %-60 %] 40 %  INTAKE / OUTPUT: Intake/Output     03/01 0701 - 03/02 0700 03/02 0701 - 03/03 0700   I.V. (mL/kg) 140 (1.1)    Other  30   NG/GT 1105 395   IV Piggyback  100   Total Intake(mL/kg) 1245 (9.7) 525 (4.1)   Urine (mL/kg/hr) 1390 (0.5) 200 (0.4)   Total Output 1390 200   Net -145 +325        Stool Occurrence  1 x    PHYSICAL EXAMINATION: General:  Chronically ill appearing, on ATC, follows some commands  Neuro: Eyes open,  following commands. HEENT: trach with bloody secretions Cardiovascular:  Regular,  no m/g/r  Lungs:  Bilateral air entry, few rhonchi. Abdomen:  Obese, distend, TF via peg  Musculoskeletal:  Trace edema. Skin:  Intact.  LABS:  PULMONARY  Recent Labs Lab 10/11/13 0500 10/12/13 0421 10/13/13 0410  PHART 7.447 7.510* 7.477*  PCO2ART 38.4 34.2* 34.9*  PO2ART 91.4 86.0 96.8  HCO3 26.1* 27.3* 25.3*  TCO2 27.3 28 26.3  O2SAT 97.1 97.0 97.6   CBC  Recent Labs Lab 10/13/13 0505 10/14/13 0330 10/15/13 0500  HGB 11.0* 11.0* 11.5*  HCT 35.2* 35.4* 37.9*  WBC 13.3* 12.7* 14.5*   PLT 224 194 242   CARDIAC  No results found for this basename: TROPONINI,  in the last 168 hours  Recent Labs Lab 10/09/13 0410  PROBNP 378.9*   CHEMISTRY  Recent Labs Lab 10/10/13 0525 10/11/13 0500 10/12/13 0640 10/13/13 0505 10/14/13 0330 10/15/13 0500  NA 146 148* 148* 147 150* 148*  K 3.3* 3.2* 4.0 3.6* 3.8 3.4*  CL 109 108 109 108 111 109  CO2 25 26 28 26 27 29   GLUCOSE 211* 220* 144* 150* 199* 161*  BUN 35* 31* 30* 27* 32* 31*  CREATININE 1.11 1.04 0.98 1.03 1.03 1.09  CALCIUM 9.3 9.4 9.5 9.5 9.4 9.8  MG 2.2 2.3 2.2 2.2 2.4  --   PHOS 2.7 3.2 3.5 3.7 3.0  --    Estimated Creatinine Clearance: 93.4 ml/min (by C-G formula based on Cr of 1.09).  INFECTIOUS  Recent Labs Lab 10/10/13 0525  LATICACIDVEN 1.2   ENDOCRINE CBG (last 3)   Recent Labs  10/14/13 2353 10/15/13 0331 10/15/13 0736  GLUCAP 141* 170* 137*   IMAGING x48h  Ct Head Wo Contrast  10/15/2013   CLINICAL DATA:  Mental status changes.  EXAM: CT HEAD WITHOUT CONTRAST  TECHNIQUE: Contiguous axial images were obtained from the base of the skull through the vertex without intravenous contrast.  COMPARISON:  10/12/2013  FINDINGS: Left temporal parietal hematoma has decreased slightly in size now measuring 3 x 2.3 cm versus prior 3.3 x 2.6 cm. Surrounding vasogenic edema minimally changed.  No new intracranial hemorrhage detected.  Prominent small vessel disease type changes without CT evidence of large acute infarct.  Atrophy without hydrocephalus.  Opacification sphenoid sinus air cells and ethmoid sinus air cells once again noted.  Vascular calcifications.  IMPRESSION: Left temporal parietal hematoma has decreased slightly in size now measuring 3 x 2.3 cm versus prior 3.3 x 2.6 cm. Surrounding vasogenic edema minimally changed.  No new intracranial hemorrhage detected.  Prominent small vessel disease type changes without CT evidence of large acute infarct.  Opacification sphenoid sinus air cells and  ethmoid sinus air cells once again noted.   Electronically Signed   By: Chauncey Cruel M.D.   On: 10/15/2013 00:22   Dg Chest Port 1 View  10/14/2013   CLINICAL DATA:  Tracheostomy.  Diabetes.  Obstructive sleep apnea.  EXAM: PORTABLE CHEST - 1 VIEW  COMPARISON:  DG CHEST 1V PORT dated 10/13/2013  FINDINGS: Two frontal radiographs. Tracheostomy appropriate position. Patient rotated left. Left internal jugular line terminates at the high SVC. Pacer with leads at right atrium and right ventricle. No lead discontinuity.  Cardiomegaly accentuated by AP portable technique. Possible small left pleural effusion. No pneumothorax. No congestive failure. Improved aeration with decreased right and similar left base airspace opacities.  IMPRESSION: Cardiomegaly with slightly improved aeration. Patchy bibasilar atelectasis remains.  Possible small left pleural effusion.   Electronically Signed   By: Abigail Miyamoto  M.D.   On: 10/14/2013 07:26   Dg Abd Portable 1v  10/14/2013   CLINICAL DATA:  Abdominal distention  EXAM: PORTABLE ABDOMEN - 1 VIEW  COMPARISON:  DG ABD PORTABLE 1V dated 10/08/2013; DG ABD PORTABLE 1V dated 10/04/2013; DG ABD PORTABLE 1V dated 10/02/2013; DG ABD PORTABLE 1V dated 10/04/2013; DG CHEST 1V PORT dated 10/14/2013  FINDINGS: Worsening now moderate to marked gas distention of predominantly the colon with index loop of redundant transverse colon within the left upper abdominal quadrant now measuring approximately 8.9 cm in diameter. No definitive gas distention of the small bowel.  Interval removal of enteric tube though a suspected gastrostomy tube overlies the left upper abdominal quadrant.  Nondiagnostic evaluation pneumoperitoneum secondary to supine positioning exclusion the lower thorax. No definite pneumatosis or portal venous gas.  IMPRESSION: 1. Overall findings most suggestive worsening ileus, though note, distal obstruction cannot be entirely excluded on the basis of this examination. Continued attention  on follow-up may be performed as clinically indicated. 2. Interval removal of enteric tube with suspected placement of a feeding gastrostomy tube.   Electronically Signed   By: Sandi Mariscal M.D.   On: 10/14/2013 15:21   ASSESSMENT / PLAN:  PULMONARY A:  Acute respiratory failure, re intubated 2/18 due to PE from Right atrial lead clot Possible PNA vs atelectasis Acute RLL PE Suspected OSA/OHS Duplex LE negative for DVT 10/08/13 Trached 2/27  P:   -ATC as tolerated -No full anti-coag given ICH, neuro ok with lovenox.  -Continue SCDs but no IVC filter. 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  Looking less likely this is endocarditis  P:  - Per Cards. - Goal SBP <160 - at goal. - Empiric abx for endocarditis/RA vegetation; stopped, last dose is 2/26. - Not a candicate for thrombectomy when he cannot be heparinized or direct t-PA. - Hold ASA. - Labetalol PRN.  RENAL A:   CKD stage 3 Hypernatremia  P:   -Trend BMP. -Free water as ordered (on hold 3/1 for vomiting)  GASTROINTESTINAL A:   Obesity Mild elevated LFTs, resolved GERD on PPI preadmission Nutrition Vomiting - noted 3/1.  Resolved.  Ileus - noted on KUB 3/1 P:   -zofran as needed -Protonix. -reglan x 4 doses  HEMATOLOGIC A:   Mild anemia - dilutional  P:  -Trend CBC, hgb stable -PRBC for hgb < 7gm%  INFECTIOUS A:   Possible pneumonia vs atx, Pct is equivocal. R/O Endocarditis - TEE 10/08/13: Mural thrombus and not endocarditis  P:   -Ended ceftriaxone on 10/11/13. -monitor off abx  ENDOCRINE A:   DM  P:   -SSI.  NEUROLOGIC A: Left temp/parietal ICH w associated edema Acute encephalopathy Worsening hemorrhage 10/08/13 imaging of brain  P:   -Goal RASS 0 to -1. -d/c all sedation . -Fentanyl  PRN. -No anticoagulation at least till 10/14/13, then reassess  GLOBAL -Ongoing ATC wean -LTAC planned, [facility] does not have bed 3/2   Noe Gens, NP-C [facility] Pgr: [phone] or [phone]  PCCM ATTENDING: I have interviewed and examined the patient and reviewed the database. I have formulated the assessment and plan as reflected in the note above with amendments made by me. 30 mins of direct critical care time provided. When approved by Stroke team, he should be on  heparin.   Merton Border, MD;  PCCM service; Mobile [phone]

## 2013-10-15 NOTE — Progress Notes (Signed)
[facility] Progress Note Patient Name: [name] DOB: 1946-04-24 MRN: [id number]  Date of Service  10/15/2013   HPI/Events of Note   Hypokalemia  eICU Interventions  2 runs IV.   Intervention Category Intermediate Interventions: Electrolyte abnormality - evaluation and management  [name] 10/15/2013, 6:29 AM

## 2013-10-15 NOTE — Progress Notes (Signed)
Worked with daughter and PACE program administrator on Friday 2/27 to rescind the PACE Medicare so that pt would be able to go to [facility] or SNF at d/c. Daughter completed the paperwork and it was left at the main CM office for the PACE NP to pick up as arranged with their administrator. She didn't pick it up until this am so the change in Medicare coverage was never documented.  It must be reflected in the system in order for patient to be able to transfer.  Hopeful for tomorrow/Wednesday.

## 2013-10-15 NOTE — Progress Notes (Signed)
Stroke Team Progress Note  HISTORY [name] is an 68 y.o. male who lives with a roommate. Roommate felt that patient was not at his baseline today and this morning felt hat he was hallucinating. He went to his day program where they noticed that he was not talking right. EMS was called at that time. Last known well 09/30/2013 at 22:30. Patient was not administerd TPA secondary to delay in arrival. He was admitted to the neuro ICU for further evaluation and treatment.  SUBJECTIVE No family at bedside.  OBJECTIVE Most recent Vital Signs: Filed Vitals:   10/15/13 0600 10/15/13 0700 10/15/13 0717 10/15/13 0800  BP: 177/94 159/89 159/89 172/100  Pulse: 70 72 76 70  Temp:   98.4 F (36.9 C)   TempSrc:   Axillary   Resp: 19 20 26 25   Height:      Weight:      SpO2: 98% 98% 94% 93%   CBG (last 3)   Recent Labs  10/14/13 2353 10/15/13 0331 10/15/13 0736  GLUCAP 141* 170* 137*    IV Fluid Intake:   . feeding supplement (VITAL HIGH PROTEIN) 1,000 mL (10/15/13 0800)    MEDICATIONS  . amiodarone  200 mg Oral Daily  . antiseptic oral rinse  15 mL Mouth Rinse QID  . aspirin EC  81 mg Oral Daily  . atorvastatin  10 mg Oral q1800  . brimonidine  1 drop Both Eyes TID  . carvedilol  25 mg Per Tube BID WC  . chlorhexidine  15 mL Mouth Rinse BID  . cloNIDine  0.1 mg Per Tube BID  . dorzolamide-timolol  1 drop Both Eyes BID  . enoxaparin (LOVENOX) injection  40 mg Subcutaneous Q24H  . feeding supplement (PRO-STAT SUGAR FREE 64)  30 mL Per Tube BID  . free water  200 mL Per Tube Q6H  . insulin aspart  0-20 Units Subcutaneous 6 times per day  . isosorbide dinitrate  10 mg Oral BID  . magnesium oxide  400 mg Oral Daily  . pantoprazole sodium  40 mg Per Tube QHS  . pneumococcal 23 valent vaccine  0.5 mL Intramuscular Tomorrow-1000  . polyethylene glycol  17 g Oral Daily  . polyvinyl alcohol  1 drop Both Eyes BID  . potassium chloride  10 mEq Intravenous Q1 Hr x 2  .  senna-docusate  1 tablet Oral BID   PRN:  acetaminophen, fentaNYL, hydrALAZINE, labetalol, ondansetron (ZOFRAN) IV  Diet:  NPO  Activity:  As tolerated DVT Prophylaxis:  Lovenox 40 mg sq daily   CLINICALLY SIGNIFICANT STUDIES Basic Metabolic Panel:   Recent Labs Lab 10/13/13 0505 10/14/13 0330 10/15/13 0500  NA 147 150* 148*  K 3.6* 3.8 3.4*  CL 108 111 109  CO2 26 27 29   GLUCOSE 150* 199* 161*  BUN 27* 32* 31*  CREATININE 1.03 1.03 1.09  CALCIUM 9.5 9.4 9.8  MG 2.2 2.4  --   PHOS 3.7 3.0  --    Liver Function Tests:  No results found for this basename: AST, ALT, ALKPHOS, BILITOT, PROT, ALBUMIN,  in the last 168 hours CBC:   Recent Labs Lab 10/14/13 0330 10/15/13 0500  WBC 12.7* 14.5*  HGB 11.0* 11.5*  HCT 35.4* 37.9*  MCV 83.1 85.0  PLT 194 242   Coagulation:  No results found for this basename: LABPROT, INR,  in the last 168 hours Cardiac Enzymes:   Recent Labs Lab 10/09/13 1018  CKTOTAL 45  CKMB <1.0  Urinalysis:  No results found for this basename: COLORURINE, APPERANCEUR, LABSPEC, [place], GLUCOSEU, HGBUR, BILIRUBINUR, KETONESUR, PROTEINUR, UROBILINOGEN, NITRITE, LEUKOCYTESUR,  in the last 168 hours Lipid Panel    Component Value Date/Time   CHOL 134 10/05/2013 0500   TRIG 80 10/14/2013 0330   HDL 37* 10/05/2013 0500   CHOLHDL 3.6 10/05/2013 0500   VLDL 30 10/05/2013 0500   LDLCALC 67 10/05/2013 0500   HgbA1C  Lab Results  Component Value Date   HGBA1C 8.2* 10/02/2013    Urine Drug Screen:     Component Value Date/Time   LABOPIA NONE DETECTED 10/01/2013 1915   COCAINSCRNUR NONE DETECTED 10/01/2013 1915   COCAINSCRNUR NEG 12/16/2008 2246   LABBENZ NONE DETECTED 10/01/2013 1915   LABBENZ NEG 12/16/2008 2246   AMPHETMU NONE DETECTED 10/01/2013 1915   AMPHETMU NEG 12/16/2008 2246   THCU NONE DETECTED 10/01/2013 1915   LABBARB NONE DETECTED 10/01/2013 1915    Alcohol Level:  No results found for this basename: ETH,  in the last 168 hours  CT of the  brain   10/15/2013 Left temporal parietal hematoma has decreased slightly in size now measuring 3 x 2.3 cm versus prior 3.3 x 2.6 cm. Surrounding  vasogenic edema minimally changed. No new intracranial hemorrhage detected.  Prominent small vessel disease type changes without CT evidence of  large acute infarct. Opacification sphenoid sinus air cells and ethmoid sinus air cells once again noted. 2/27/20151. Overall slight interval decrease in size of left temporoparietal  intraparenchymal hemorrhage now measuring 3.4 x 2.6 cm, previously 3.5 x 2.9 cm. Similar localized mass effect without midline shift. 2. No new intracranial hemorrhage or large vessel territory infarct. 3. Slightly improved paranasal sinus disease.  10/08/2013 Interval increase in size of left temporoparietal intraparenchymal hematoma, was 2.2 x 3.1 cm, now 2.9 x 3.5 cm concerning for rebleed/ propagation of hemorrhagic stroke. Local mass effect without midline shift. No acute large vascular territory.  10/05/2013 Contracting left temporoparietal intraparenchymal hematoma was 2.7 x 3.8 cm, now 2.2 x 3.1 cm without rebleed. 10/03/2013 Contracting left temporal parietal intraparenchymal hematoma, no rebleed. Moderate white matter changes suggest chronic small vessel ischemic disease. 10/02/2013   Limited portable CT: Skullbase and posterior fossa not been included.  Evolving left temporoparietal intraparenchymal hematoma with similar mass effect. No new hemorrhage.   10/01/2013    1. 4 x 3 cm left temporal/parietal parenchymal hematoma. 2. 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 3. Diffuse inflammatory paranasal sinus disease.     MRI/MRA head pacemaker  2D Echo There is a mass present in the right atrium and right ventricle. It is not seen in the IVC. It was not present on the study of May, 2014. Considering all factors, the most likely etiology is clot. The mass is seen in the RA. There is also mass that moves across the  tricuspid valve. There is probably also mass in the RV cavity. I can not be sure, but all of these targets might be connected.  Carotid Doppler (on hold d/t IJ)  LE Venous Dopplers There is no evidence of DVT or SVT in the bilateral lower extremities.  CT angio chest 1. Right lower lobar pulmonary embolus. 2. Left upper lobe on bilateral lower lobe airspace disease. While this likely represents atelectasis, early infection is not excluded. 3. Mild cardiomegaly and atherosclerotic disease. 4. Support apparatus as described.  CXR   10/14/2013 Cardiomegaly with slightly improved aeration. Patchy bibasilar atelectasis remains. Possible small left pleural effusion. 10/13/2013 Little  change since previous day's exam 10/12/2013 Tracheostomy tube in place. No diagnostic pneumothorax. Bilateral basilar atelectasis or infiltrate. No pulmonary edema.  10/12/2013 Low lung volumes with left basilar atelectasis. Support apparatuses as described. 10/11/2013 1. Endotracheal tube tip 5.1 cm above the carina. Central line and NG tube in stable position. 2. Persistent bibasilar infiltrates and/or atelectasis. No change.  10/10/2013 1. Line and tube position stable. 2. Severe stable cardiomegaly. 3. Slight clearing of mild bibasilar atelectasis and/or infiltrates. 10/09/2013 Cardiomegaly without edema. No change in bibasilar atelectasis. Left IJ catheter has backed out slightly with the tip now just in  the superior vena cava. 10/07/2013 Enlargement of cardiac silhouette. Mild bibasilar atelectasis with improved aeration at right base versus previous study. 10/07/2013 Suboptimal exam due to positioning. Persistent low lung volumes and right lower lung opacity. 10/06/2013 Increased right base atelectasis and effusion. 10/05/2013 Stable cardiomegaly, bibasilar airspace opacities with improved  aeration of left lung base. Probable right middle lobe collapse. Small residual pleural effusions.  10/04/2013 Decreased right basilar  atelectasis. Otherwise no significant  change. 10/03/2013 Fairly small right pleural effusion with right base atelectasis. Lungs are otherwise clear. Tube and catheter positions as described without pneumothorax. 10/02/2013    Endotracheal tube 4 cm above the carina. Left central line tip projected laterally towards the lateral wall of the upper SVC. No pneumothorax.  Cardiomegaly with vascular congestion.  Small effusions and bibasilar atelectasis.    10/01/2013   The appearance of the pulmonary interstitium suggests mild interstitial edema likely secondary to CHF. There is no evidence of pneumonia or pleural effusion.     ABD xray 10/14/2013 . Overall findings most suggestive worsening ileus, though note,  distal obstruction cannot be entirely excluded on the basis of this examination. Continued attention on follow-up may be performed as clinically indicated. 2. Interval removal of enteric tube with suspected placement of a feeding gastrostomy tube.  EKG  Sinus or ectopic atrial rhythm. Prolonged PR interval. Anterior infarct, old. No significant change was found.   Therapy Recommendations   Physical Exam General: The patient is obese. He is intubated. Cardiovascular: Distant heart sounds, regular rate. Abdomen: Obese, nontender, bowel sounds present but diminished. Skin: No significant peripheral edema is noted. 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  Neurologic Exam Mental status: The patient is intubated and sedated. Aphasic and will not follow commands. Cranial nerves: Opens eyes partially to auditory stimuli. Facial symmetry is present. The patient is intubated, sedated. Eyes are deviated to the left, no blink to threat. Some facial grimace is noted with pain stimulation. The patient will open his eyes with stimulation. Motor: The patient has withdrawal movements on all 4 extremities. Sensory examination: The patient will respond to deep pain stimulation with withdrawal on all 4  extremities. Coordination: Cerebellar testing cannot be performed. Gait and station: The patient is on bedrest, the patient could not be ambulated. Reflexes: Deep tendon reflexes are symmetric, but are depressed.   ASSESSMENT [name] is a 68 y.o. male presenting with altered speech. Imaging confirms a left temporal parietal IPH with significant surrounding edema - no hydrocephalus, no [name]. Suspect underlying mass or ischemic infarct given amt of edema, possible vegetation. Unable to get MRI to confirm due to pacer. Slight increase in hemmorrhage size 10/08/2013. But stable on Ct head 10/12/13. Has respiratory insufficiency due to PE from mobile mass (clot vs vegetation) found on pacer wire in RA extending into RV. Not an anticoagulation candidate for treatment due to [place].  Placed on  empiric abs for possible endocarditis/RA vegetation on wire - abx to stop 2/26. On aspirin 81 mg orally every day prior to admission. Now on aspirin 81 mg orally every day. Patient purposeful, localizes; global aphasia, right hemirparesis, dysphagia.  Respiratory failure, intubated  due to respiratory insufficiency, low sats after admission. Extubated/reintubated 2/18 am due to pulmonary edema vs aspiration pneumonia. Started on vanc and zosyn. Suspected OSA/OHS. Unable to wean. S/p Trach and PEG 2/27, tolerating TCT. RLL pulmonary embolus, not a anticoagulation candidate due to hemorrhage. PE from mobile mass.  obstructive sleep apnea  atrial fibrillation, not a coumadin candidate secondary to hemorrhage. Not on prior to admission due to hx rectal bleeding. hypertension  Hyperlipidemia, on crestor 40 mg daily PTA  Pacemaker. intermittant CHB with appropriate PPM function.  CAD  Non-ischemic cardiomyopathy   Vomiting, abd films neg for ileus, improved with zofran 3/1  Diabetes, uncontrolled, HgbA1c 8.2, goal 7.0 Obesity, Body mass index is 38.23 kg/(m^2).   Legally blind  Herpes  Chronic  kidney disease, stage 3  Hypokalemia, K 4.0   Hypernatremia, free water flushes at 200 q6 hrs  Hospital day # 14  TREATMENT/PLAN  Continue ASA 81mg  daily for secondary stroke prevention. No anticoagulation given hx rectal bleeding.   Daughter completed rescind of PACE benefits to allow for [facility] placement this week.  Burnetta Sabin, MSN, RN, ANVP-BC, ANP-BC, GNP-BC [facility] Pager: [phone] 10/15/2013 4:35 PM This patient is critically ill and at significant risk of neurological worsening, death and care requires constant monitoring of vital signs, hemodynamics,respiratory and cardiac monitoring,review of multiple databases, neurological assessment, discussion with family, other specialists and medical decision making of high complexity. I spent 30 minutes of neurocritical care time  in the care of  this patient. I have personally obtained a history, examined the patient, evaluated imaging results, and formulated the assessment and plan of care. I agree with the above. Antony Contras, MD

## 2013-10-16 ENCOUNTER — Inpatient Hospital Stay
Admission: AD | Admit: 2013-10-16 | Discharge: 2013-11-15 | Disposition: A | Discharge status: Skilled Nursing Facility | Payer: Self-pay | Source: Ambulatory Visit | Attending: Internal Medicine | Admitting: Internal Medicine

## 2013-10-16 ENCOUNTER — Other Ambulatory Visit (HOSPITAL_COMMUNITY): Payer: Medicare (Managed Care)

## 2013-10-16 DIAGNOSIS — E785 Hyperlipidemia, unspecified: Secondary | ICD-10-CM | POA: Diagnosis present

## 2013-10-16 DIAGNOSIS — N179 Acute kidney failure, unspecified: Secondary | ICD-10-CM | POA: Diagnosis present

## 2013-10-16 DIAGNOSIS — G894 Chronic pain syndrome: Secondary | ICD-10-CM | POA: Diagnosis not present

## 2013-10-16 DIAGNOSIS — B3749 Other urogenital candidiasis: Secondary | ICD-10-CM | POA: Diagnosis not present

## 2013-10-16 DIAGNOSIS — J449 Chronic obstructive pulmonary disease, unspecified: Secondary | ICD-10-CM | POA: Diagnosis present

## 2013-10-16 DIAGNOSIS — M6281 Muscle weakness (generalized): Secondary | ICD-10-CM | POA: Diagnosis not present

## 2013-10-16 DIAGNOSIS — R52 Pain, unspecified: Secondary | ICD-10-CM | POA: Diagnosis not present

## 2013-10-16 DIAGNOSIS — I4891 Unspecified atrial fibrillation: Secondary | ICD-10-CM | POA: Diagnosis present

## 2013-10-16 DIAGNOSIS — K560 Paralytic ileus: Secondary | ICD-10-CM | POA: Diagnosis not present

## 2013-10-16 DIAGNOSIS — Z7401 Bed confinement status: Secondary | ICD-10-CM | POA: Diagnosis not present

## 2013-10-16 DIAGNOSIS — N189 Chronic kidney disease, unspecified: Secondary | ICD-10-CM | POA: Diagnosis present

## 2013-10-16 DIAGNOSIS — I619 Nontraumatic intracerebral hemorrhage, unspecified: Secondary | ICD-10-CM | POA: Diagnosis not present

## 2013-10-16 DIAGNOSIS — Z5189 Encounter for other specified aftercare: Secondary | ICD-10-CM | POA: Diagnosis not present

## 2013-10-16 DIAGNOSIS — Z430 Encounter for attention to tracheostomy: Secondary | ICD-10-CM | POA: Diagnosis not present

## 2013-10-16 DIAGNOSIS — N6489 Other specified disorders of breast: Secondary | ICD-10-CM | POA: Diagnosis not present

## 2013-10-16 DIAGNOSIS — Z452 Encounter for adjustment and management of vascular access device: Secondary | ICD-10-CM | POA: Diagnosis not present

## 2013-10-16 DIAGNOSIS — Z4682 Encounter for fitting and adjustment of non-vascular catheter: Secondary | ICD-10-CM | POA: Diagnosis not present

## 2013-10-16 DIAGNOSIS — I6990 Unspecified sequelae of unspecified cerebrovascular disease: Secondary | ICD-10-CM | POA: Diagnosis not present

## 2013-10-16 DIAGNOSIS — R488 Other symbolic dysfunctions: Secondary | ICD-10-CM | POA: Diagnosis not present

## 2013-10-16 DIAGNOSIS — R935 Abnormal findings on diagnostic imaging of other abdominal regions, including retroperitoneum: Secondary | ICD-10-CM | POA: Diagnosis not present

## 2013-10-16 DIAGNOSIS — G4733 Obstructive sleep apnea (adult) (pediatric): Secondary | ICD-10-CM | POA: Diagnosis present

## 2013-10-16 DIAGNOSIS — R1312 Dysphagia, oropharyngeal phase: Secondary | ICD-10-CM | POA: Diagnosis not present

## 2013-10-16 DIAGNOSIS — A412 Sepsis due to unspecified staphylococcus: Secondary | ICD-10-CM | POA: Diagnosis not present

## 2013-10-16 DIAGNOSIS — I2510 Atherosclerotic heart disease of native coronary artery without angina pectoris: Secondary | ICD-10-CM | POA: Diagnosis present

## 2013-10-16 DIAGNOSIS — J9600 Acute respiratory failure, unspecified whether with hypoxia or hypercapnia: Secondary | ICD-10-CM | POA: Diagnosis not present

## 2013-10-16 DIAGNOSIS — R5381 Other malaise: Secondary | ICD-10-CM | POA: Diagnosis present

## 2013-10-16 DIAGNOSIS — Z930 Tracheostomy status: Secondary | ICD-10-CM | POA: Diagnosis not present

## 2013-10-16 DIAGNOSIS — I129 Hypertensive chronic kidney disease with stage 1 through stage 4 chronic kidney disease, or unspecified chronic kidney disease: Secondary | ICD-10-CM | POA: Diagnosis present

## 2013-10-16 DIAGNOSIS — I69920 Aphasia following unspecified cerebrovascular disease: Secondary | ICD-10-CM | POA: Diagnosis not present

## 2013-10-16 DIAGNOSIS — Z8673 Personal history of transient ischemic attack (TIA), and cerebral infarction without residual deficits: Secondary | ICD-10-CM | POA: Diagnosis not present

## 2013-10-16 DIAGNOSIS — I2699 Other pulmonary embolism without acute cor pulmonale: Secondary | ICD-10-CM | POA: Diagnosis not present

## 2013-10-16 DIAGNOSIS — J9819 Other pulmonary collapse: Secondary | ICD-10-CM | POA: Diagnosis not present

## 2013-10-16 DIAGNOSIS — R141 Gas pain: Secondary | ICD-10-CM | POA: Diagnosis not present

## 2013-10-16 DIAGNOSIS — E46 Unspecified protein-calorie malnutrition: Secondary | ICD-10-CM | POA: Diagnosis present

## 2013-10-16 DIAGNOSIS — I6350 Cerebral infarction due to unspecified occlusion or stenosis of unspecified cerebral artery: Secondary | ICD-10-CM | POA: Diagnosis not present

## 2013-10-16 DIAGNOSIS — I629 Nontraumatic intracranial hemorrhage, unspecified: Secondary | ICD-10-CM | POA: Diagnosis not present

## 2013-10-16 DIAGNOSIS — E119 Type 2 diabetes mellitus without complications: Secondary | ICD-10-CM | POA: Diagnosis not present

## 2013-10-16 DIAGNOSIS — I10 Essential (primary) hypertension: Secondary | ICD-10-CM | POA: Diagnosis not present

## 2013-10-16 DIAGNOSIS — R918 Other nonspecific abnormal finding of lung field: Secondary | ICD-10-CM | POA: Diagnosis not present

## 2013-10-16 DIAGNOSIS — G8929 Other chronic pain: Secondary | ICD-10-CM | POA: Diagnosis not present

## 2013-10-16 DIAGNOSIS — H548 Legal blindness, as defined in USA: Secondary | ICD-10-CM | POA: Diagnosis present

## 2013-10-16 DIAGNOSIS — Z431 Encounter for attention to gastrostomy: Secondary | ICD-10-CM | POA: Diagnosis not present

## 2013-10-16 DIAGNOSIS — R7881 Bacteremia: Secondary | ICD-10-CM | POA: Diagnosis not present

## 2013-10-16 LAB — GLUCOSE, CAPILLARY
GLUCOSE-CAPILLARY: 128 mg/dL — AB (ref 70–99)
Glucose-Capillary: 254 mg/dL — ABNORMAL HIGH (ref 70–99)
Glucose-Capillary: 147 mg/dL — ABNORMAL HIGH (ref 70–99)
Glucose-Capillary: 114 mg/dL — ABNORMAL HIGH (ref 70–99)
Glucose-Capillary: 177 mg/dL — ABNORMAL HIGH (ref 70–99)

## 2013-10-16 MED ORDER — INSULIN ASPART 100 UNIT/ML ~~LOC~~ SOLN: 0.0000 [IU] | SUBCUTANEOUS | Status: DC

## 2013-10-16 MED ORDER — BRIMONIDINE TARTRATE 0.2 % OP SOLN: 1.0000 [drp] | Freq: Three times a day (TID) | OPHTHALMIC | Status: DC

## 2013-10-16 MED ORDER — ATORVASTATIN CALCIUM 10 MG PO TABS: 10.0000 mg | ORAL_TABLET | Freq: Every day | ORAL | Status: DC

## 2013-10-16 MED ORDER — BIOTENE DRY MOUTH MT LIQD: 15.0000 mL | Freq: Four times a day (QID) | OROMUCOSAL | Status: DC

## 2013-10-16 MED ORDER — PNEUMOCOCCAL VAC POLYVALENT 25 MCG/0.5ML IJ INJ: 0.5000 mL | INJECTION | INTRAMUSCULAR | Status: DC

## 2013-10-16 MED ORDER — ONDANSETRON HCL 4 MG/2ML IJ SOLN: 4.0000 mg | Freq: Four times a day (QID) | INTRAMUSCULAR | Status: DC | PRN

## 2013-10-16 MED ORDER — ACETAMINOPHEN 325 MG PO TABS: 650.0000 mg | ORAL_TABLET | Freq: Four times a day (QID) | ORAL | Status: DC | PRN

## 2013-10-16 MED ORDER — HYDRALAZINE HCL 20 MG/ML IJ SOLN: 10.0000 mg | INTRAMUSCULAR | Status: DC | PRN

## 2013-10-16 MED ORDER — ASPIRIN 81 MG PO CHEW: 81.0000 mg | CHEWABLE_TABLET | Freq: Every day | ORAL | Status: DC

## 2013-10-16 MED ORDER — FREE WATER: 200.0000 mL | Freq: Four times a day (QID) | Status: DC

## 2013-10-16 MED ORDER — ASPIRIN 81 MG PO CHEW
81.0000 mg | CHEWABLE_TABLET | Freq: Every day | ORAL | Status: DC
  Administered 2013-10-16: 81 mg via ORAL
  Filled 2013-10-16: qty 1

## 2013-10-16 MED ORDER — ISOSORBIDE DINITRATE 10 MG PO TABS
10.0000 mg | ORAL_TABLET | Freq: Two times a day (BID) | ORAL | Status: DC
  Filled 2013-10-16: qty 1

## 2013-10-16 MED ORDER — PANTOPRAZOLE SODIUM 40 MG PO PACK: 40.0000 mg | PACK | Freq: Every day | ORAL | Status: DC

## 2013-10-16 MED ORDER — CHLORHEXIDINE GLUCONATE 0.12 % MT SOLN: 15.0000 mL | Freq: Two times a day (BID) | OROMUCOSAL | Status: DC

## 2013-10-16 MED ORDER — BISACODYL 10 MG RE SUPP: 10.0000 mg | Freq: Every day | RECTAL | Status: DC | PRN

## 2013-10-16 MED ORDER — ISOSORBIDE DINITRATE 10 MG PO TABS: 10.0000 mg | ORAL_TABLET | Freq: Two times a day (BID) | ORAL | Status: DC

## 2013-10-16 MED ORDER — VITAL HIGH PROTEIN PO LIQD: 1000.0000 mL | ORAL | Status: DC

## 2013-10-16 MED ORDER — FENTANYL CITRATE 0.05 MG/ML IJ SOLN: 50.0000 ug | INTRAMUSCULAR | Status: DC | PRN

## 2013-10-16 MED ORDER — AMIODARONE HCL 200 MG PO TABS: 200.0000 mg | ORAL_TABLET | Freq: Every day | ORAL | Status: DC

## 2013-10-16 MED ORDER — BISACODYL 10 MG RE SUPP
10.0000 mg | Freq: Every day | RECTAL | Status: DC | PRN
  Administered 2013-10-16: 10 mg via RECTAL
  Filled 2013-10-16: qty 1

## 2013-10-16 MED ORDER — MAGNESIUM OXIDE 400 (241.3 MG) MG PO TABS: 400.0000 mg | ORAL_TABLET | Freq: Every day | ORAL | Status: DC

## 2013-10-16 MED ORDER — ENOXAPARIN SODIUM 40 MG/0.4ML ~~LOC~~ SOLN: 40.0000 mg | SUBCUTANEOUS | Status: DC

## 2013-10-16 MED ORDER — ATORVASTATIN CALCIUM 10 MG PO TABS
10.0000 mg | ORAL_TABLET | Freq: Every day | ORAL | Status: DC
  Administered 2013-10-16: 10 mg
  Filled 2013-10-16: qty 1

## 2013-10-16 MED ORDER — PRO-STAT SUGAR FREE PO LIQD: 30.0000 mL | Freq: Two times a day (BID) | ORAL | Status: DC

## 2013-10-16 MED ORDER — LABETALOL HCL 5 MG/ML IV SOLN: 10.0000 mg | INTRAVENOUS | Status: DC | PRN

## 2013-10-16 NOTE — Progress Notes (Signed)
Stroke Team Progress Note  HISTORY [name] is an 68 y.o. male who lives with a roommate. Roommate felt that patient was not at his baseline today and this morning felt hat he was hallucinating. He went to his day program where they noticed that he was not talking right. EMS was called at that time. Last known well 09/30/2013 at 22:30. Patient was not administerd TPA secondary to delay in arrival. He was admitted to the neuro ICU for further evaluation and treatment.  SUBJECTIVE No family at bedside.  OBJECTIVE Most recent Vital Signs: Filed Vitals:   10/16/13 0700 10/16/13 0723 10/16/13 0730 10/16/13 0800  BP: 166/89 166/89  160/83  Pulse: 70 73  70  Temp:   99.3 F (37.4 C)   TempSrc:   Oral   Resp: 25 25  21   Height:      Weight:      SpO2: 100% 100%  99%   CBG (last 3)   Recent Labs  10/15/13 2003 10/15/13 2331 10/16/13 0344  GLUCAP 178* 128* 254*    IV Fluid Intake:   . feeding supplement (VITAL HIGH PROTEIN) 1,000 mL (10/16/13 0800)    MEDICATIONS  . amiodarone  200 mg Oral Daily  . antiseptic oral rinse  15 mL Mouth Rinse QID  . aspirin EC  81 mg Oral Daily  . atorvastatin  10 mg Oral q1800  . brimonidine  1 drop Both Eyes TID  . carvedilol  25 mg Per Tube BID WC  . chlorhexidine  15 mL Mouth Rinse BID  . cloNIDine  0.1 mg Per Tube BID  . dorzolamide-timolol  1 drop Both Eyes BID  . enoxaparin (LOVENOX) injection  40 mg Subcutaneous Q24H  . feeding supplement (PRO-STAT SUGAR FREE 64)  30 mL Per Tube BID  . free water  200 mL Per Tube Q6H  . insulin aspart  0-20 Units Subcutaneous 6 times per day  . isosorbide dinitrate  10 mg Oral BID  . magnesium oxide  400 mg Oral Daily  . pantoprazole sodium  40 mg Per Tube QHS  . pneumococcal 23 valent vaccine  0.5 mL Intramuscular Tomorrow-1000  . polyethylene glycol  17 g Oral Daily  . polyvinyl alcohol  1 drop Both Eyes BID  . senna-docusate  1 tablet Oral BID   PRN:  acetaminophen, fentaNYL, hydrALAZINE,  labetalol, ondansetron (ZOFRAN) IV  Diet:  NPO  Activity:  As tolerated DVT Prophylaxis:  Lovenox 40 mg sq daily   CLINICALLY SIGNIFICANT STUDIES Basic Metabolic Panel:   Recent Labs Lab 10/13/13 0505 10/14/13 0330 10/15/13 0500  NA 147 150* 148*  K 3.6* 3.8 3.4*  CL 108 111 109  CO2 26 27 29   GLUCOSE 150* 199* 161*  BUN 27* 32* 31*  CREATININE 1.03 1.03 1.09  CALCIUM 9.5 9.4 9.8  MG 2.2 2.4  --   PHOS 3.7 3.0  --    Liver Function Tests:  No results found for this basename: AST, ALT, ALKPHOS, BILITOT, PROT, ALBUMIN,  in the last 168 hours CBC:   Recent Labs Lab 10/14/13 0330 10/15/13 0500  WBC 12.7* 14.5*  HGB 11.0* 11.5*  HCT 35.4* 37.9*  MCV 83.1 85.0  PLT 194 242   Coagulation:  No results found for this basename: LABPROT, INR,  in the last 168 hours Cardiac Enzymes:   Recent Labs Lab 10/09/13 1018  CKTOTAL 45  CKMB <1.0   Urinalysis:  No results found for this basename: COLORURINE, APPERANCEUR, [place], [place],  [place], [place], [place], [place], PROTEINUR, UROBILINOGEN, NITRITE, LEUKOCYTESUR,  in the last 168 hours Lipid Panel    Component Value Date/Time   CHOL 134 10/05/2013 0500   TRIG 80 10/14/2013 0330   HDL 37* 10/05/2013 0500   CHOLHDL 3.6 10/05/2013 0500   VLDL 30 10/05/2013 0500   LDLCALC 67 10/05/2013 0500   HgbA1C  Lab Results  Component Value Date   HGBA1C 8.2* 10/02/2013    Urine Drug Screen:     Component Value Date/Time   LABOPIA NONE DETECTED 10/01/2013 1915   COCAINSCRNUR NONE DETECTED 10/01/2013 1915   COCAINSCRNUR NEG 12/16/2008 2246   LABBENZ NONE DETECTED 10/01/2013 1915   LABBENZ NEG 12/16/2008 2246   AMPHETMU NONE DETECTED 10/01/2013 1915   AMPHETMU NEG 12/16/2008 2246   THCU NONE DETECTED 10/01/2013 1915   LABBARB NONE DETECTED 10/01/2013 1915    Alcohol Level:  No results found for this basename: ETH,  in the last 168 hours  CT of the brain   10/15/2013 Left temporal parietal hematoma has decreased slightly in size now  measuring 3 x 2.3 cm versus prior 3.3 x 2.6 cm. Surrounding  vasogenic edema minimally changed. No new intracranial hemorrhage detected.  Prominent small vessel disease type changes without CT evidence of  large acute infarct. Opacification sphenoid sinus air cells and ethmoid sinus air cells once again noted. 2/27/20151. Overall slight interval decrease in size of left temporoparietal  intraparenchymal hemorrhage now measuring 3.4 x 2.6 cm, previously 3.5 x 2.9 cm. Similar localized mass effect without midline shift. 2. No new intracranial hemorrhage or large vessel territory infarct. 3. Slightly improved paranasal sinus disease.  10/08/2013 Interval increase in size of left temporoparietal intraparenchymal hematoma, was 2.2 x 3.1 cm, now 2.9 x 3.5 cm concerning for rebleed/ propagation of hemorrhagic stroke. Local mass effect without midline shift. No acute large vascular territory.  10/05/2013 Contracting left temporoparietal intraparenchymal hematoma was 2.7 x 3.8 cm, now 2.2 x 3.1 cm without rebleed. 10/03/2013 Contracting left temporal parietal intraparenchymal hematoma, no rebleed. Moderate white matter changes suggest chronic small vessel ischemic disease. 10/02/2013   Limited portable CT: Skullbase and posterior fossa not been included.  Evolving left temporoparietal intraparenchymal hematoma with similar mass effect. No new hemorrhage.   10/01/2013    1. 4 x 3 cm left temporal/parietal parenchymal hematoma. 2. 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 3. Diffuse inflammatory paranasal sinus disease.     MRI/MRA head pacemaker  2D Echo There is a mass present in the right atrium and right ventricle. It is not seen in the IVC. It was not present on the study of May, 2014. Considering all factors, the most likely etiology is clot. The mass is seen in the RA. There is also mass that moves across the tricuspid valve. There is probably also mass in the RV cavity. I can not be sure, but  all of these targets might be connected.  Carotid Doppler (on hold d/t IJ)  LE Venous Dopplers There is no evidence of DVT or SVT in the bilateral lower extremities.  CT angio chest 1. Right lower lobar pulmonary embolus. 2. Left upper lobe on bilateral lower lobe airspace disease. While this likely represents atelectasis, early infection is not excluded. 3. Mild cardiomegaly and atherosclerotic disease. 4. Support apparatus as described.  CXR   10/14/2013 Cardiomegaly with slightly improved aeration. Patchy bibasilar atelectasis remains. Possible small left pleural effusion. 10/13/2013 Little change since previous day's exam 10/12/2013 Tracheostomy tube in place. No diagnostic  pneumothorax. Bilateral basilar atelectasis or infiltrate. No pulmonary edema.  10/12/2013 Low lung volumes with left basilar atelectasis. Support apparatuses as described. 10/11/2013 1. Endotracheal tube tip 5.1 cm above the carina. Central line and NG tube in stable position. 2. Persistent bibasilar infiltrates and/or atelectasis. No change.  10/10/2013 1. Line and tube position stable. 2. Severe stable cardiomegaly. 3. Slight clearing of mild bibasilar atelectasis and/or infiltrates. 10/09/2013 Cardiomegaly without edema. No change in bibasilar atelectasis. Left IJ catheter has backed out slightly with the tip now just in  the superior vena cava. 10/07/2013 Enlargement of cardiac silhouette. Mild bibasilar atelectasis with improved aeration at right base versus previous study. 10/07/2013 Suboptimal exam due to positioning. Persistent low lung volumes and right lower lung opacity. 10/06/2013 Increased right base atelectasis and effusion. 10/05/2013 Stable cardiomegaly, bibasilar airspace opacities with improved  aeration of left lung base. Probable right middle lobe collapse. Small residual pleural effusions.  10/04/2013 Decreased right basilar atelectasis. Otherwise no significant  change. 10/03/2013 Fairly small right pleural  effusion with right base atelectasis. Lungs are otherwise clear. Tube and catheter positions as described without pneumothorax. 10/02/2013    Endotracheal tube 4 cm above the carina. Left central line tip projected laterally towards the lateral wall of the upper SVC. No pneumothorax.  Cardiomegaly with vascular congestion.  Small effusions and bibasilar atelectasis.    10/01/2013   The appearance of the pulmonary interstitium suggests mild interstitial edema likely secondary to CHF. There is no evidence of pneumonia or pleural effusion.     ABD xray 10/14/2013 . Overall findings most suggestive worsening ileus, though note,  distal obstruction cannot be entirely excluded on the basis of this examination. Continued attention on follow-up may be performed as clinically indicated. 2. Interval removal of enteric tube with suspected placement of a feeding gastrostomy tube.  EKG  Sinus or ectopic atrial rhythm. Prolonged PR interval. Anterior infarct, old. No significant change was found.   Therapy Recommendations LTACH for PT, OT and ST once stable  Physical Exam General: The patient is obese. He is intubated. Cardiovascular: Distant heart sounds, regular rate. Abdomen: Obese, nontender, bowel sounds present but diminished. Skin: No significant peripheral edema is noted. 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  Neurologic Exam Mental status: The patient is intubated and sedated. Aphasic and will not follow commands. Cranial nerves: Opens eyes partially to auditory stimuli. Facial symmetry is present. The patient is intubated, sedated. Eyes are deviated to the left, no blink to threat. Some facial grimace is noted with pain stimulation. The patient will open his eyes with stimulation. Motor: The patient has withdrawal movements on all 4 extremities. Sensory examination: The patient will respond to deep pain stimulation with withdrawal on all 4 extremities. Coordination: Cerebellar testing cannot be  performed. Gait and station: The patient is on bedrest, the patient could not be ambulated. Reflexes: Deep tendon reflexes are symmetric, but are depressed.   ASSESSMENT [name] is a 68 y.o. male presenting with altered speech. Imaging confirms a left temporal parietal IPH with significant surrounding edema - no hydrocephalus, no [name]. Suspect underlying mass or ischemic infarct given amt of edema, possible vegetation. Unable to get MRI to confirm due to pacer. Slight increase in hemmorrhage size 10/08/2013. But stable on CT head 10/12/13. Has respiratory insufficiency due to PE from mobile mass (clot vs vegetation) found on pacer wire in RA extending into RV. Not an anticoagulation candidate for treatment due to [place].  Placed on empiric abs for possible endocarditis/RA  vegetation on wire - abx to stop 2/26. On aspirin 81 mg orally every day prior to admission. Now on aspirin 81 mg orally every day. Patient purposeful, localizes; global aphasia, right hemirparesis, dysphagia.  Respiratory failure, intubated  due to respiratory insufficiency, low sats after admission. Extubated/reintubated 2/18 am due to pulmonary edema vs aspiration pneumonia. Started on vanc and zosyn. Suspected OSA/OHS. Unable to wean. S/p Trach and PEG 2/27, tolerating TCT. RLL pulmonary embolus, not a anticoagulation candidate due to hemorrhage. PE from mobile mass.  obstructive sleep apnea  atrial fibrillation, not a coumadin candidate secondary to hemorrhage. Not on prior to admission due to hx rectal bleeding. hypertension  Hyperlipidemia, on crestor 40 mg daily PTA  Pacemaker. intermittant CHB with appropriate PPM function.  CAD  Non-ischemic cardiomyopathy   Vomiting, abd films suggests ileus, improved with zofran 3/1 with increased abdominal size today  Diabetes, uncontrolled, HgbA1c 8.2, goal 7.0 Obesity, Body mass index is 37.22 kg/(m^2).   Legally blind  Herpes  Chronic kidney disease, stage  3  Hypokalemia, K 3.4  Hypernatremia, 148  Leukocytosis 14.5  Hospital day # 15  TREATMENT/PLAN  Continue ASA 81mg  daily for secondary stroke prevention. No anticoagulation given hx rectal bleeding.   Transfer to [facility] when bed available.   CCM continues to follow medical needs, including extended abd  Burnetta Sabin, MSN, RN, ANVP-BC, ANP-BC, [facility] Pager: [phone] 10/16/2013 8:10 AM  I have personally obtained a history, examined the patient, evaluated imaging results, and formulated the assessment and plan of care. I agree with the above.  Antony Contras, MD

## 2013-10-16 NOTE — Progress Notes (Signed)
Received call from [facility] liaison at 1255 that pt had been approved for transfer to [facility] today.  Called pt's daughter, [name], on her cell phone.  Left voicemail for her that her father had been approved for transfer today. Advised that she would be receiving a call from [place] at [facility] to obtain consent for treatment at their facility as well as a call from assigned 62M RN to get consent to transfer.   Spoke with 62M RN, Lauree Chandler and updated her. She stated that she would also reach out to daughter via phone to get consent.  [facility] rep will call unit directly with the room number and MD name for the patient.   Text page sent to Burnetta Sabin, NP at 1311pm to advise her of situation. She returned call minutes later to say that she could complete the d/c summary and sign the EMTALA form.

## 2013-10-16 NOTE — Discharge Instructions (Signed)
Ischemic Stroke    Blood carries oxygen to all areas of your body. A stroke happens when your blood does not flow to your brain like normal. If this happens, your brain will not get the oxygen it needs and brain tissue will die. This is an emergency.  Problems (symptoms) of a stroke usually happen suddenly. You may notice them when you wake up. They can include:  Loss of feeling or weakness on one side of the body (face, arm, leg).  Feeling confused.  Trouble talking or understanding.  Trouble seeing.  Trouble walking.  Feeling dizzy.  Loss of balance or coordination.  Severe headache without a cause.  Trouble reading or writing. Get help within 3 4 hours of when your problems first started. If you do not know when your problems started, get help as soon as you can. This is important.   RISK FACTORS  Risk factors are things that make it more likely for you to have a stroke. These things include:  High blood pressure (hypertension).  High cholesterol.  Diabetes.  Heart disease.  Having a buildup of fatty deposits in the blood vessels.  Having an abnormal heart rhythm (atrial fibrillation).  Being very overweight (obese).  Smoking.  Taking birth control pills, especially if you smoke.  Not being active.  Having a diet high in fats, salt, and calories.  Drinking too much alcohol.  Using illegal drugs.  Being African American.  Being over the age of 78.  Having a family history of stroke.  Having a history of blood clots, stroke, warning stroke (transient ischemic attack, TIA), or heart attack.  Sickle cell disease.   Stroke Prevention    Some health problems and behaviors may make it more likely for you to have a stroke. Below are ways to lessen your risk of having a stroke.  Be active for at least 30 minutes on most or all days.  Do not smoke. Try not to be around others who smoke.  Do not drink too much alcohol.  Do not have more than 2 drinks a day if you are a man.  Do  not have more than 1 drink a day if you are a woman and are not pregnant.  Eat healthy foods, such as fruits and vegetables. If you were put on a specific diet, follow the diet as told.  Keep your cholesterol levels under control through diet and medicines. Look for foods that are low in saturated fat, trans fat, cholesterol, and are high in fiber.  Normal Ranges Cholesterol <200 mg/L Triglycerides < 150 mg/L HDL > 39mg /L LDL <99 mg/L    Your Discharge Cholesterol levels are :  Lab Results  Component Value Date   CHOL 134 10/05/2013   HDL 37* 10/05/2013   LDLCALC 67 10/05/2013   TRIG 80 10/14/2013   CHOLHDL 3.6 10/05/2013         If you have diabetes, follow all diet plans and take your medicine as told. Hemoglobin A1C measures your blood glucose control over a period of time.      Normal HgA1c <5.7 %  Your discharge HGA1C is:  Lab Results  Component Value Date   HGBA1C 8.2* 10/02/2013        If you have high blood pressure (hypertension), follow all diet plans and take your medicine as told.   Normal Blood pressure Systolic (top number) 0000000 Diastolic (bottom number) 0000000 Your last Blood pressure is:  BP Readings from Last 3 Encounters:  10/16/13 174/90  10/16/13 174/90  09/11/13 136/82           Keep a healthy weight. Eat foods that are low in calories, salt, saturated fat, trans fat, and cholesterol.  Do not take drugs.  Avoid birth control pills, if this applies. Talk to your doctor about the risks of taking birth control pills.  Talk to your doctor if you have sleep problems (sleep apnea).  Take all medicine as told by your doctor.  You may be told to take aspirin or blood thinner medicine. Take this medicine as told by your doctor.  Understand your medicine instructions.  Make sure any other conditions you have are being taken care of. GET HELP RIGHT AWAY IF:  You suddenly lose feeling (you feel numb) or have weakness in your face, arm, or leg.  Your face or eyelid  hangs down to one side.  You suddenly feel confused.  You have trouble talking (aphasia) or understanding what people are saying.  You suddenly have trouble seeing in one or both eyes.  You suddenly have trouble walking.  You are dizzy.  You lose your balance or your movements are clumsy (uncoordinated).  You suddenly have a very bad headache and you do not know the cause.  You have new chest pain.  Your heart feels like it is fluttering or skipping a beat (irregular heartbeat). Do not wait to see if the symptoms above go away. Get help right away. Call your local emergency services (911 in [place] Do not drive yourself to the hospital.  Document Released: 02/01/2012 Document Revised: 05/23/2013 Document Reviewed: 02/02/2013  [facility] Patient Information 2014 [place].

## 2013-10-16 NOTE — Discharge Summary (Signed)
Stroke Discharge Summary  Patient ID: [name]   MRN: [id number]      DOB: 1946-08-11  Date of Admission: 10/01/2013 Date of Discharge: 10/16/2013  Attending Physician:  Suzzanne Cloud, MD, Stroke MD  Consulting Physician(s):   Treatment Team:  Md [facility], MD [facility], MD, Judeth Horn, MD (general surgery)  Patient's PCP:  Sherian Maroon, MD  Discharge Diagnoses: Principal Problem:   Intracerebral hemorrhage - left temporal parietal IPH with significant surrounding edema, Suspect underlying mass or ischemic infarct given amt of edema. Right ventricular pacemaker wire thrombosis with   pulmonary embolism Active Problems:   Hypertension   Hyperlipidemia    obstructive sleep apnea   Diabetes    Chronic kidney disease, stage 3     Hypokalemia   Hypernatremia   Leukocytosis    Ileus   Acute respiratory failure   Acute pulmonary edema   Aspiration pneumonia   atrial fibrillation   Cardiac mass   Acute pulmonary embolism   Mural thrombus of heart   Obesity, BMI: Body mass index is 37.22 kg/(m^2).  Past Medical History  Diagnosis Date  . Diabetes type 2, uncontrolled   . Gout   . Hyperlipidemia   . Hypertension   . Obstructive sleep apnea     On CPAP. Last sleep study (06/2009) - Severe obstructive sleep apnea/hypopnea syndrome, apnea-hypopnea index 70.5 per hour with non positional events moderately loud snoring and oxygen desaturation to a nadir of 85% on room air.  . Benign prostatic hypertrophy   . Morbid obesity   . Cardiomyopathy, hypertensive      diastolic dysfunction by [id number] echo  . Coronary artery disease      nonobstructive. 2 vessel. cardiac catheter in March 2005 showing 60% LAD occlusion, 30% circumflex occlusion.  . 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 Legally blind   . Herpes   . Occult blood positive stool      History of in August 2007.  colonoscopy in January 2008 showing normal colon with fair inadequate prep.  By Dr. Silvano Rusk.  . Incarcerated ventral hernia     H/O. S/P repair ventral hernia repair 07/2005.  . Diabetic ulcer of thigh     H/O Rt thigh ulcer.  . Diabetic peripheral neuropathy   . Exertional shortness of breath     "comes and goes" (06/23/2013)  . GERD (gastroesophageal reflux disease)   . Degenerative joint disease      Right knee.  . Arthritis     "all over" (06/23/2013)  . Sepsis 08/26/2013  . Atrial fibrillation     not Candidate for Coumadin d/t rectal bleeding  . Hypotension 09/05/2013   Past Surgical History  Procedure Laterality Date  . Pacemaker lead revision  12/2003     likely microdislodgment  of right ventricular lead  . Ventral hernia repair  07/2005     S/P laparotomy, lysis of adhesions, repair of incarcerated ventral hernia with  partial omentectomy.. Performed by Dr. Marlou Starks.  . Hernia repair    . Cataract extraction, bilateral Bilateral   . Refractive surgery Bilateral   . Eye surgery    . 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 Amputation Left 06/22/2013    Procedure: AMPUTATION FOOT;  Surgeon: Newt Minion, MD;  Location: [place];  Service: Orthopedics;  Laterality: Left;  Left Midfoot Amputation  . Insert / replace / remove pacemaker  10/2003  Secondary to symptomatic bradycardia. DDDR pacemaker. By Dr. Rollene Fare.  . Insert / replace / remove pacemaker  12-06-12    "old pacemaker died; had new one put in" (07/22/13)  . Esophagogastroduodenoscopy N/A 10/12/2013    Procedure: ESOPHAGOGASTRODUODENOSCOPY (EGD);  Surgeon: Gwenyth Ober, MD;  Location: [place];  Service: General;  Laterality: N/A;  bedside  . Peg placement N/A 10/12/2013    Procedure: PERCUTANEOUS ENDOSCOPIC GASTROSTOMY (PEG) PLACEMENT;  Surgeon: Gwenyth Ober, MD;  Location: [facility];  Service: General;  Laterality: N/A;      Medication List    STOP taking these medications       brimonidine 0.1 % Soln  Commonly known as:  ALPHAGAN P   Replaced by:  brimonidine 0.2 % ophthalmic solution     colchicine 0.6 MG tablet     omeprazole 20 MG tablet  Commonly known as:  PRILOSEC OTC     polyethylene glycol packet  Commonly known as:  MIRALAX / GLYCOLAX     sennosides-docusate sodium 8.6-50 MG tablet  Commonly known as:  SENOKOT-S      TAKE these medications       acetaminophen 325 MG tablet  Commonly known as:  TYLENOL  Take 2 tablets (650 mg total) by mouth every 6 (six) hours as needed for fever (>99.5).     antiseptic oral rinse Liqd  15 mLs by Mouth Rinse route QID.     bisacodyl 10 MG suppository  Commonly known as:  DULCOLAX  Place 1 suppository (10 mg total) rectally daily as needed for moderate constipation.     brimonidine 0.2 % ophthalmic solution  Commonly known as:  ALPHAGAN  Place 1 drop into both eyes 3 (three) times daily.     carvedilol 25 MG tablet  Commonly known as:  COREG  Take 25 mg by mouth 2 (two) times daily with a meal.     chlorhexidine 0.12 % solution  Commonly known as:  PERIDEX  15 mLs by Mouth Rinse route 2 (two) times daily.     dorzolamide-timolol 22.3-6.8 MG/ML ophthalmic solution  Commonly known as:  COSOPT  Place 1 drop into both eyes 2 (two) times daily.     enoxaparin 40 MG/0.4ML injection  Commonly known as:  LOVENOX  Inject 0.4 mLs (40 mg total) into the skin daily.     feeding supplement (PRO-STAT SUGAR FREE 64) Liqd  Place 30 mLs into feeding tube 2 (two) times daily.     feeding supplement (VITAL HIGH PROTEIN) Liqd liquid  Place 1,000 mLs into feeding tube continuous.     fentaNYL 0.05 MG/ML injection  Commonly known as:  SUBLIMAZE  Inject 1-2 mLs (50-100 mcg total) into the vein every 2 (two) hours as needed for severe pain (to maintain RASS goal.).     free water Soln  Place 200 mLs into feeding tube every 6 (six) hours.     hydrALAZINE 20 MG/ML injection  Commonly known as:  APRESOLINE  Inject 0.5 mLs (10 mg total) into the vein every 2 (two)  hours as needed (keep systolic less than or equal to 170 mmHg).     insulin aspart 100 UNIT/ML injection  Commonly known as:  novoLOG  Inject 0-20 Units into the skin every 4 (four) hours.     isosorbide dinitrate 10 MG tablet  Commonly known as:  ISORDIL  Place 1 tablet (10 mg total) into feeding tube 2 (two) times daily.     labetalol 5 MG/ML injection  Commonly known as:  NORMODYNE,TRANDATE  Inject 2-3 mLs (10-15 mg total) into the vein every 2 (two) hours as needed.     methylcellulose 1 % ophthalmic solution  Commonly known as:  ARTIFICIAL TEARS  Place 1 drop into both eyes 2 (two) times daily.     ondansetron 4 MG/2ML Soln injection  Commonly known as:  ZOFRAN  Inject 2 mLs (4 mg total) into the vein every 6 (six) hours as needed for nausea or vomiting.     pantoprazole sodium 40 mg/20 mL Pack  Commonly known as:  PROTONIX  Place 20 mLs (40 mg total) into feeding tube at bedtime.     pneumococcal 23 valent vaccine 25 MCG/0.5ML injection  Commonly known as:  PNU-IMMUNE  Inject 0.5 mLs into the muscle tomorrow at 10 am.      ASK your doctor about these medications       albuterol 108 (90 BASE) MCG/ACT inhaler  Commonly known as:  PROVENTIL HFA;VENTOLIN HFA  Inhale 2 puffs into the lungs every 6 (six) hours as needed for wheezing.     amiodarone 200 MG tablet  Commonly known as:  PACERONE  Take 200 mg by mouth daily.     aspirin 81 MG chewable tablet  Place 1 tablet (81 mg total) into feeding tube daily.  Start taking on:  10/17/2013  Ask about: Which instructions should I use?     aspirin 81 MG chewable tablet  Chew 81 mg by mouth daily.  Ask about: Which instructions should I use?     cloNIDine 0.2 MG tablet  Commonly known as:  CATAPRES  Take 0.5 tablets (0.1 mg total) by mouth 2 (two) times daily.     HYDROcodone-acetaminophen 5-325 MG per tablet  Commonly known as:  NORCO/VICODIN  Take 1-2 tablets by mouth every 6 (six) hours as needed for moderate pain.      insulin glargine 100 UNIT/ML injection  Commonly known as:  LANTUS  Inject 0.08 mLs (8 Units total) into the skin at bedtime.     isosorbide mononitrate 30 MG 24 hr tablet  Commonly known as:  IMDUR  Take 30 mg by mouth daily.     magnesium oxide 400 MG tablet  Commonly known as:  MAG-OX  Take 400 mg by mouth daily.     rosuvastatin 40 MG tablet  Commonly known as:  CRESTOR  Take 40 mg by mouth every evening.        LABORATORY STUDIES CBC    Component Value Date/Time   WBC 14.5* 10/15/2013 0500   RBC 4.46 10/15/2013 0500   HGB 11.5* 10/15/2013 0500   HCT 37.9* 10/15/2013 0500   PLT 242 10/15/2013 0500   MCV 85.0 10/15/2013 0500   MCH 25.8* 10/15/2013 0500   MCHC 30.3 10/15/2013 0500   RDW 16.1* 10/15/2013 0500   LYMPHSABS 2.1 10/07/2013 0500   MONOABS 0.8 10/07/2013 0500   EOSABS 0.5 10/07/2013 0500   BASOSABS 0.0 10/07/2013 0500   CMP    Component Value Date/Time   NA 148* 10/15/2013 0500   K 3.4* 10/15/2013 0500   CL 109 10/15/2013 0500   CO2 29 10/15/2013 0500   GLUCOSE 161* 10/15/2013 0500   BUN 31* 10/15/2013 0500   CREATININE 1.09 10/15/2013 0500   CREATININE 2.28* 04/23/2013 1216   CALCIUM 9.8 10/15/2013 0500   PROT 6.9 10/04/2013 0758   ALBUMIN 2.4* 10/04/2013 0758   AST 18 10/04/2013 0758   ALT 30 10/04/2013 0758  ALKPHOS 110 10/04/2013 0758   BILITOT 0.7 10/04/2013 0758   GFRNONAA 69* 10/15/2013 0500   GFRAA 80* 10/15/2013 0500   COAGS Lab Results  Component Value Date   INR 1.10 10/01/2013   INR 1.30 09/05/2013   INR 1.00 06/22/2013   Lipid Panel    Component Value Date/Time   CHOL 134 10/05/2013 0500   TRIG 80 10/14/2013 0330   HDL 37* 10/05/2013 0500   CHOLHDL 3.6 10/05/2013 0500   VLDL 30 10/05/2013 0500   LDLCALC 67 10/05/2013 0500   HgbA1C  Lab Results  Component Value Date   HGBA1C 8.2* 10/02/2013   Cardiac Panel (last 3 results) No results found for this basename: CKTOTAL, CKMB, TROPONINI, RELINDX,  in the last 72 hours Urinalysis    Component Value Date/Time    COLORURINE YELLOW 10/01/2013 [place] 10/01/2013 1915   LABSPEC 1.013 10/01/2013 1915   PHURINE 7.5 10/01/2013 1915   GLUCOSEU NEGATIVE 10/01/2013 1915   HGBUR NEGATIVE 10/01/2013 1915   HGBUR trace-lysed 03/25/2010 1416   BILIRUBINUR NEGATIVE 10/01/2013 1915   KETONESUR NEGATIVE 10/01/2013 1915   PROTEINUR NEGATIVE 10/01/2013 1915   UROBILINOGEN 0.2 10/01/2013 1915   NITRITE NEGATIVE 10/01/2013 1915   LEUKOCYTESUR NEGATIVE 10/01/2013 1915   Urine Drug Screen     Component Value Date/Time   LABOPIA NONE DETECTED 10/01/2013 1915   COCAINSCRNUR NONE DETECTED 10/01/2013 1915   COCAINSCRNUR NEG 12/16/2008 2246   LABBENZ NONE DETECTED 10/01/2013 1915   LABBENZ NEG 12/16/2008 2246   AMPHETMU NONE DETECTED 10/01/2013 1915   AMPHETMU NEG 12/16/2008 2246   THCU NONE DETECTED 10/01/2013 1915   LABBARB NONE DETECTED 10/01/2013 1915    Alcohol Level    Component Value Date/Time   ETH <11 10/01/2013 1504     SIGNIFICANT DIAGNOSTIC STUDIES CT of the brain  10/15/2013 Left temporal parietal hematoma has decreased slightly in size now measuring 3 x 2.3 cm versus prior 3.3 x 2.6 cm. Surrounding vasogenic edema minimally changed. No new intracranial hemorrhage detected. Prominent small vessel disease type changes without CT evidence of large acute infarct. Opacification sphenoid sinus air cells and ethmoid sinus air cells once again noted.  2/27/20151. Overall slight interval decrease in size of left temporoparietal intraparenchymal hemorrhage now measuring 3.4 x 2.6 cm, previously 3.5 x 2.9 cm. Similar localized mass effect without midline shift. 2. No new intracranial hemorrhage or large vessel territory infarct. 3. Slightly improved paranasal sinus disease.  10/08/2013 Interval increase in size of left temporoparietal intraparenchymal hematoma, was 2.2 x 3.1 cm, now 2.9 x 3.5 cm concerning for rebleed/ propagation of hemorrhagic stroke. Local mass effect without midline shift. No acute large vascular  territory.  10/05/2013 Contracting left temporoparietal intraparenchymal hematoma was 2.7 x 3.8 cm, now 2.2 x 3.1 cm without rebleed.  10/03/2013 Contracting left temporal parietal intraparenchymal hematoma, no rebleed. Moderate white matter changes suggest chronic small vessel ischemic disease.  10/02/2013 Limited portable CT: Skullbase and posterior fossa not been included. Evolving left temporoparietal intraparenchymal hematoma with similar mass effect. No new hemorrhage.  10/01/2013 1. 4 x 3 cm left temporal/parietal parenchymal hematoma. 2. 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 3. Diffuse inflammatory paranasal sinus disease.  MRI/MRA head pacemaker  2D Echo There is a mass present in the right atrium and right ventricle. It is not seen in the IVC. It was not present on the study of May, 2014. Considering all factors, the most likely etiology is clot. The mass is seen  in the RA. There is also mass that moves across the tricuspid valve. There is probably also mass in the RV cavity. I can not be sure, but all of these targets might be connected.  Carotid Doppler (on hold d/t IJ)  LE Venous Dopplers There is no evidence of DVT or SVT in the bilateral lower extremities.  CT angio chest 1. Right lower lobar pulmonary embolus. 2. Left upper lobe on bilateral lower lobe airspace disease. While this likely represents atelectasis, early infection is not excluded. 3. Mild cardiomegaly and atherosclerotic disease. 4. Support apparatus as described.  CXR  10/14/2013 Cardiomegaly with slightly improved aeration. Patchy bibasilar atelectasis remains. Possible small left pleural effusion.  10/13/2013 Little change since previous day's exam  10/12/2013 Tracheostomy tube in place. No diagnostic pneumothorax. Bilateral basilar atelectasis or infiltrate. No pulmonary edema.  10/12/2013 Low lung volumes with left basilar atelectasis. Support apparatuses as described.  10/11/2013 1. Endotracheal tube tip  5.1 cm above the carina. Central line and NG tube in stable position. 2. Persistent bibasilar infiltrates and/or atelectasis. No change.  10/10/2013 1. Line and tube position stable. 2. Severe stable cardiomegaly. 3. Slight clearing of mild bibasilar atelectasis and/or infiltrates.  10/09/2013 Cardiomegaly without edema. No change in bibasilar atelectasis. Left IJ catheter has backed out slightly with the tip now just in the superior vena cava.  10/07/2013 Enlargement of cardiac silhouette. Mild bibasilar atelectasis with improved aeration at right base versus previous study.  10/07/2013 Suboptimal exam due to positioning. Persistent low lung volumes and right lower lung opacity.  10/06/2013 Increased right base atelectasis and effusion.  10/05/2013 Stable cardiomegaly, bibasilar airspace opacities with improved aeration of left lung base. Probable right middle lobe collapse. Small residual pleural effusions.  10/04/2013 Decreased right basilar atelectasis. Otherwise no significant change.  10/03/2013 Fairly small right pleural effusion with right base atelectasis. Lungs are otherwise clear. Tube and catheter positions as described without pneumothorax.  10/02/2013 Endotracheal tube 4 cm above the carina. Left central line tip projected laterally towards the lateral wall of the upper SVC. No pneumothorax. Cardiomegaly with vascular congestion. Small effusions and bibasilar atelectasis.  10/01/2013 The appearance of the pulmonary interstitium suggests mild interstitial edema likely secondary to CHF. There is no evidence of pneumonia or pleural effusion.  ABD xray  10/14/2013 . Overall findings most suggestive worsening ileus, though note, distal obstruction cannot be entirely excluded on the basis of this examination. Continued attention on follow-up may be performed as clinically indicated. 2. Interval removal of enteric tube with suspected placement of a feeding gastrostomy tube.  EKG Sinus or ectopic atrial  rhythm. Prolonged PR interval. Anterior infarct, old. No significant change was found.      History of Present Illness   Kerry Barklow is an 68 y.o. male who lives with a roommate. Roommate felt that patient was not at his baseline today and this morning felt hat he was hallucinating. He went to his day program where they noticed that he was not talking right. EMS was called at that time. Last known well 09/30/2013 at 22:30. Patient was not administerd TPA secondary to delay in arrival. He was admitted to the neuro ICU for further evaluation and treatment.   Hospital Course Imaging confirms a left temporal parietal IPH with significant surrounding edema - no hydrocephalus, no [name]. Suspect underlying mass or ischemic infarct given amt of edema, possible vegetation. Unable to get MRI to confirm due to pacer. Slight increase in hemmorrhage size 10/08/2013. But stable on CT head  10/12/13. Has respiratory insufficiency due to PE from mobile mass (most likely clot vs unlikely vegetation) found on pacer wire in RA extending into RV. Not an anticoagulation candidate for treatment due to [place]. Placed on empiric abs for possible endocarditis/RA vegetation on wire - completed course ceftriaxone on 2/26. On aspirin 81 mg orally every day prior to admission.  Now on aspirin 81 mg orally every day.    Patient with vascular risk factors of:  atrial fibrillation, not a coumadin candidate secondary to hemorrhage. Not on prior to admission due to hx rectal bleeding.  hypertension Hyperlipidemia, on crestor 40 mg daily PTA  obstructive sleep apnea CAD  Non-ischemic cardiomyopathy Diabetes, uncontrolled, HgbA1c 8.2, goal 7.0  Obesity, Body mass index is 37.22 kg/(m^2).   Patient also with: Respiratory failure, intubated due to respiratory insufficiency, low sats after admission. Extubated/reintubated 2/18 am due to pulmonary edema vs aspiration pneumonia. Started on vanc and zosyn. Suspected OSA/OHS. Unable to wean.  S/p Trach and PEG 2/27, tolerating TCT.  RLL pulmonary embolus, not a anticoagulation candidate due t obrain hemorrhage. PE from mobile mass on RV pacer wire.  Pacemaker. intermittant CHB with appropriate PPM function.  Legally blind  Chronic kidney disease, stage 3  Hypokalemia, K 3.4 Hypernatremia, 148  Leukocytosis 14.5 Vomiting, abd films suggest possible ileus, improved with zofran 3/1, abd worsening 3/3. On reglan x 4 doses and PPI.  Patient purposeful, localizes; global aphasia, right hemirparesis, dysphagia. Physical therapy, occupational therapy and speech therapy need to evaluate patient. This is recommended to do after transfer to [facility].   Discharge Exam  Blood pressure 142/75, pulse 70, temperature 97.8 F (36.6 C), temperature source Axillary, resp. rate 19, height 6' (1.829 m), weight 124.5 kg (274 lb 7.6 oz), SpO2 94.00%.  General: The patient is obese. He is intubated.  Cardiovascular: Distant heart sounds, regular rate.  Abdomen: Obese, nontender, bowel sounds present but diminished.  Skin: No significant peripheral edema is noted. 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  Neurologic Exam  Mental status: The patient is intubated and sedated. Aphasic and will not follow commands.  Cranial nerves: Opens eyes partially to auditory stimuli. Facial symmetry is present. The patient is intubated, sedated. Eyes are deviated to the left, no blink to threat. Some facial grimace is noted with pain stimulation. The patient will open his eyes with stimulation.  Motor: The patient has withdrawal movements on all 4 extremities.  Sensory examination: The patient will respond to deep pain stimulation with withdrawal on all 4 extremities.  Coordination: Cerebellar testing cannot be performed.  Gait and station: The patient is on bedrest, the patient could not be ambulated.  Reflexes: Deep tendon reflexes are symmetric, but are depressed.   Discharge Diet     tube feedings, NPO  Discharge  Plan    Disposition:  Transfer to [facility]  aspirin 81 mg orally every day for secondary stroke prevention.   Check CT head Monday, call STroke Team at [phone] to have them review, if blood is isodense, will likley start pt on eliquis for PE treatment  PT, OT and ST for language/cognition at [facility]  Ongoing risk factor control by Primary Care Physician.  Follow-up with Dr. Antony Contras, Stroke Clinic in 2 months from discharge from [facility]  45 minutes were spent preparing discharge.  Signed Burnetta Sabin, AVNP-BC, ANP-BC, [facility] Nurse Practitioner 10/16/2013, 2:26 PM  I have personally examined this patient, reviewed pertinent data and developed the plan of care. I  agree with above.  Antony Contras, MD

## 2013-10-17 ENCOUNTER — Institutional Professional Consult (permissible substitution) (HOSPITAL_COMMUNITY): Payer: Medicare (Managed Care)

## 2013-10-17 DIAGNOSIS — R918 Other nonspecific abnormal finding of lung field: Secondary | ICD-10-CM | POA: Diagnosis not present

## 2013-10-17 DIAGNOSIS — J9819 Other pulmonary collapse: Secondary | ICD-10-CM | POA: Diagnosis not present

## 2013-10-17 DIAGNOSIS — Z452 Encounter for adjustment and management of vascular access device: Secondary | ICD-10-CM | POA: Diagnosis not present

## 2013-10-17 DIAGNOSIS — R935 Abnormal findings on diagnostic imaging of other abdominal regions, including retroperitoneum: Secondary | ICD-10-CM | POA: Diagnosis not present

## 2013-10-17 LAB — BLOOD GAS, ARTERIAL
Acid-Base Excess: 5.4 mmol/L — ABNORMAL HIGH (ref 0.0–2.0)
Bicarbonate: 29.4 mEq/L — ABNORMAL HIGH (ref 20.0–24.0)
FIO2: 0.28 %
O2 Saturation: 98.3 %
PH ART: 7.449 (ref 7.350–7.450)
PO2 ART: 96.9 mmHg (ref 80.0–100.0)
Patient temperature: 98.6
TCO2: 30.7 mmol/L (ref 0–100)
pCO2 arterial: 43 mmHg (ref 35.0–45.0)

## 2013-10-17 LAB — PHOSPHORUS: PHOSPHORUS: 2.9 mg/dL (ref 2.3–4.6)

## 2013-10-17 LAB — CBC WITH DIFFERENTIAL/PLATELET
BASOS PCT: 0 % (ref 0–1)
Basophils Absolute: 0 10*3/uL (ref 0.0–0.1)
EOS ABS: 0.2 10*3/uL (ref 0.0–0.7)
EOS PCT: 2 % (ref 0–5)
HEMATOCRIT: 37.7 % — AB (ref 39.0–52.0)
HEMOGLOBIN: 11.4 g/dL — AB (ref 13.0–17.0)
LYMPHS ABS: 2.3 10*3/uL (ref 0.7–4.0)
Lymphocytes Relative: 16 % (ref 12–46)
MCH: 25.7 pg — AB (ref 26.0–34.0)
MCHC: 30.2 g/dL (ref 30.0–36.0)
MCV: 85.1 fL (ref 78.0–100.0)
MONO ABS: 1.3 10*3/uL — AB (ref 0.1–1.0)
Monocytes Relative: 9 % (ref 3–12)
NEUTROS PCT: 73 % (ref 43–77)
Neutro Abs: 10.5 10*3/uL — ABNORMAL HIGH (ref 1.7–7.7)
Platelets: 210 10*3/uL (ref 150–400)
RBC: 4.43 MIL/uL (ref 4.22–5.81)
RDW: 16 % — ABNORMAL HIGH (ref 11.5–15.5)
WBC: 14.4 10*3/uL — ABNORMAL HIGH (ref 4.0–10.5)

## 2013-10-17 LAB — PREALBUMIN: Prealbumin: 12.2 mg/dL — ABNORMAL LOW (ref 17.0–34.0)

## 2013-10-17 LAB — COMPREHENSIVE METABOLIC PANEL
ALBUMIN: 2.5 g/dL — AB (ref 3.5–5.2)
ALT: 92 U/L — ABNORMAL HIGH (ref 0–53)
AST: 39 U/L — ABNORMAL HIGH (ref 0–37)
Alkaline Phosphatase: 108 U/L (ref 39–117)
BUN: 35 mg/dL — ABNORMAL HIGH (ref 6–23)
CO2: 28 mEq/L (ref 19–32)
CREATININE: 1.04 mg/dL (ref 0.50–1.35)
Calcium: 9.6 mg/dL (ref 8.4–10.5)
Chloride: 111 mEq/L (ref 96–112)
GFR calc non Af Amer: 73 mL/min — ABNORMAL LOW (ref >90)
GFR, EST AFRICAN AMERICAN: 85 mL/min — AB (ref >90)
Glucose, Bld: 180 mg/dL — ABNORMAL HIGH (ref 70–99)
Potassium: 4.1 mEq/L (ref 3.7–5.3)
Sodium: 152 mEq/L — ABNORMAL HIGH (ref 137–147)
TOTAL PROTEIN: 7.5 g/dL (ref 6.0–8.3)
Total Bilirubin: 0.4 mg/dL (ref 0.3–1.2)

## 2013-10-17 LAB — HEMOGLOBIN A1C
Hgb A1c MFr Bld: 7.8 % — ABNORMAL HIGH (ref <5.7)
MEAN PLASMA GLUCOSE: 177 mg/dL — AB (ref <117)

## 2013-10-17 LAB — MAGNESIUM: MAGNESIUM: 2.3 mg/dL (ref 1.5–2.5)

## 2013-10-17 LAB — FERRITIN: FERRITIN: 827 ng/mL — AB (ref 22–322)

## 2013-10-17 LAB — T4, FREE: Free T4: 1.47 ng/dL (ref 0.80–1.80)

## 2013-10-17 LAB — URIC ACID: Uric Acid, Serum: 5.4 mg/dL (ref 4.0–7.8)

## 2013-10-17 LAB — TROPONIN I

## 2013-10-17 LAB — PRO B NATRIURETIC PEPTIDE: PRO B NATRI PEPTIDE: 283.8 pg/mL — AB (ref 0–125)

## 2013-10-17 LAB — CK TOTAL AND CKMB (NOT AT ARMC)
CK, MB: <1 ng/mL (ref 0.3–4.0)
Total CK: 45 U/L (ref 7–232)

## 2013-10-17 LAB — PROTIME-INR
INR: 1.31 (ref 0.00–1.49)
PROTHROMBIN TIME: 16 s — AB (ref 11.6–15.2)

## 2013-10-17 LAB — PROCALCITONIN: Procalcitonin: 0.13 ng/mL

## 2013-10-17 LAB — VITAMIN B12: Vitamin B-12: 437 pg/mL (ref 211–911)

## 2013-10-17 LAB — APTT: APTT: 27 s (ref 24–37)

## 2013-10-17 LAB — TSH: TSH: 1.249 u[IU]/mL (ref 0.350–4.500)

## 2013-10-17 MED ORDER — IOHEXOL 300 MG/ML  SOLN
50.0000 mL | Freq: Once | INTRAMUSCULAR | Status: AC | PRN
  Administered 2013-10-17: 30 mL via ORAL

## 2013-10-18 ENCOUNTER — Other Ambulatory Visit (HOSPITAL_COMMUNITY): Payer: Medicare (Managed Care)

## 2013-10-18 ENCOUNTER — Other Ambulatory Visit (HOSPITAL_COMMUNITY): Payer: Self-pay

## 2013-10-18 ENCOUNTER — Institutional Professional Consult (permissible substitution) (HOSPITAL_COMMUNITY): Payer: Medicare (Managed Care)

## 2013-10-18 LAB — CBC WITH DIFFERENTIAL/PLATELET
BASOS PCT: 0 % (ref 0–1)
Basophils Absolute: 0 10*3/uL (ref 0.0–0.1)
Eosinophils Absolute: 0.2 10*3/uL (ref 0.0–0.7)
Eosinophils Relative: 2 % (ref 0–5)
HCT: 36.5 % — ABNORMAL LOW (ref 39.0–52.0)
Hemoglobin: 10.9 g/dL — ABNORMAL LOW (ref 13.0–17.0)
Lymphocytes Relative: 20 % (ref 12–46)
Lymphs Abs: 2.5 10*3/uL (ref 0.7–4.0)
MCH: 25.3 pg — AB (ref 26.0–34.0)
MCHC: 29.9 g/dL — AB (ref 30.0–36.0)
MCV: 84.9 fL (ref 78.0–100.0)
MONOS PCT: 7 % (ref 3–12)
Monocytes Absolute: 0.9 10*3/uL (ref 0.1–1.0)
NEUTROS PCT: 71 % (ref 43–77)
Neutro Abs: 8.8 10*3/uL — ABNORMAL HIGH (ref 1.7–7.7)
Platelets: 231 10*3/uL (ref 150–400)
RBC: 4.3 MIL/uL (ref 4.22–5.81)
RDW: 16.3 % — ABNORMAL HIGH (ref 11.5–15.5)
WBC: 12.4 10*3/uL — ABNORMAL HIGH (ref 4.0–10.5)

## 2013-10-18 LAB — BASIC METABOLIC PANEL
BUN: 29 mg/dL — AB (ref 6–23)
CO2: 28 mEq/L (ref 19–32)
Calcium: 9.4 mg/dL (ref 8.4–10.5)
Chloride: 111 mEq/L (ref 96–112)
Creatinine, Ser: 1.05 mg/dL (ref 0.50–1.35)
GFR calc Af Amer: 84 mL/min — ABNORMAL LOW (ref >90)
GFR, EST NON AFRICAN AMERICAN: 73 mL/min — AB (ref >90)
Glucose, Bld: 183 mg/dL — ABNORMAL HIGH (ref 70–99)
POTASSIUM: 3.8 meq/L (ref 3.7–5.3)
Sodium: 150 mEq/L — ABNORMAL HIGH (ref 137–147)

## 2013-10-19 ENCOUNTER — Other Ambulatory Visit (HOSPITAL_COMMUNITY): Payer: Medicare (Managed Care)

## 2013-10-19 LAB — BASIC METABOLIC PANEL
BUN: 21 mg/dL (ref 6–23)
CALCIUM: 9.1 mg/dL (ref 8.4–10.5)
CO2: 26 mEq/L (ref 19–32)
Chloride: 108 mEq/L (ref 96–112)
Creatinine, Ser: 1.04 mg/dL (ref 0.50–1.35)
GFR, EST AFRICAN AMERICAN: 85 mL/min — AB (ref >90)
GFR, EST NON AFRICAN AMERICAN: 73 mL/min — AB (ref >90)
GLUCOSE: 206 mg/dL — AB (ref 70–99)
POTASSIUM: 3.4 meq/L — AB (ref 3.7–5.3)
SODIUM: 146 meq/L (ref 137–147)

## 2013-10-19 LAB — FOLATE RBC: RBC FOLATE: 1170 ng/mL — AB (ref >280)

## 2013-10-19 LAB — CBC
HCT: 35 % — ABNORMAL LOW (ref 39.0–52.0)
Hemoglobin: 10.6 g/dL — ABNORMAL LOW (ref 13.0–17.0)
MCH: 25.5 pg — AB (ref 26.0–34.0)
MCHC: 30.3 g/dL (ref 30.0–36.0)
MCV: 84.1 fL (ref 78.0–100.0)
Platelets: 217 10*3/uL (ref 150–400)
RBC: 4.16 MIL/uL — ABNORMAL LOW (ref 4.22–5.81)
RDW: 16.1 % — AB (ref 11.5–15.5)
WBC: 9.6 10*3/uL (ref 4.0–10.5)

## 2013-10-20 LAB — CBC WITH DIFFERENTIAL/PLATELET
Basophils Absolute: 0 K/uL (ref 0.0–0.1)
Basophils Relative: 0 % (ref 0–1)
Eosinophils Absolute: 0.2 K/uL (ref 0.0–0.7)
Eosinophils Relative: 3 % (ref 0–5)
HCT: 33.6 % — ABNORMAL LOW (ref 39.0–52.0)
Hemoglobin: 10.1 g/dL — ABNORMAL LOW (ref 13.0–17.0)
Lymphocytes Relative: 22 % (ref 12–46)
Lymphs Abs: 2.1 K/uL (ref 0.7–4.0)
MCH: 25.4 pg — ABNORMAL LOW (ref 26.0–34.0)
MCHC: 30.1 g/dL (ref 30.0–36.0)
MCV: 84.6 fL (ref 78.0–100.0)
Monocytes Absolute: 0.8 K/uL (ref 0.1–1.0)
Monocytes Relative: 9 % (ref 3–12)
Neutro Abs: 6.2 K/uL (ref 1.7–7.7)
Neutrophils Relative %: 66 % (ref 43–77)
Platelets: 235 K/uL (ref 150–400)
RBC: 3.97 MIL/uL — ABNORMAL LOW (ref 4.22–5.81)
RDW: 16 % — ABNORMAL HIGH (ref 11.5–15.5)
WBC: 9.3 K/uL (ref 4.0–10.5)

## 2013-10-20 LAB — BASIC METABOLIC PANEL
BUN: 16 mg/dL (ref 6–23)
CHLORIDE: 111 meq/L (ref 96–112)
CO2: 31 mEq/L (ref 19–32)
Calcium: 9.3 mg/dL (ref 8.4–10.5)
Creatinine, Ser: 1.02 mg/dL (ref 0.50–1.35)
GFR, EST AFRICAN AMERICAN: 87 mL/min — AB (ref >90)
GFR, EST NON AFRICAN AMERICAN: 75 mL/min — AB (ref >90)
Glucose, Bld: 239 mg/dL — ABNORMAL HIGH (ref 70–99)
POTASSIUM: 4 meq/L (ref 3.7–5.3)
SODIUM: 151 meq/L — AB (ref 137–147)

## 2013-10-21 ENCOUNTER — Other Ambulatory Visit (HOSPITAL_COMMUNITY): Payer: Medicare (Managed Care)

## 2013-10-21 DIAGNOSIS — R143 Flatulence: Secondary | ICD-10-CM | POA: Diagnosis not present

## 2013-10-21 DIAGNOSIS — R141 Gas pain: Secondary | ICD-10-CM | POA: Diagnosis not present

## 2013-10-21 LAB — DIFFERENTIAL
Basophils Absolute: 0 10*3/uL (ref 0.0–0.1)
Basophils Relative: 0 % (ref 0–1)
EOS ABS: 0.2 10*3/uL (ref 0.0–0.7)
Eosinophils Relative: 3 % (ref 0–5)
Lymphocytes Relative: 25 % (ref 12–46)
Lymphs Abs: 2.1 10*3/uL (ref 0.7–4.0)
Monocytes Absolute: 0.6 10*3/uL (ref 0.1–1.0)
Monocytes Relative: 7 % (ref 3–12)
NEUTROS ABS: 5.5 10*3/uL (ref 1.7–7.7)
Neutrophils Relative %: 65 % (ref 43–77)

## 2013-10-21 LAB — COMPREHENSIVE METABOLIC PANEL
ALBUMIN: 2.4 g/dL — AB (ref 3.5–5.2)
ALT: 24 U/L (ref 0–53)
AST: 11 U/L (ref 0–37)
Alkaline Phosphatase: 80 U/L (ref 39–117)
BILIRUBIN TOTAL: 0.3 mg/dL (ref 0.3–1.2)
BUN: 12 mg/dL (ref 6–23)
CHLORIDE: 106 meq/L (ref 96–112)
CO2: 27 mEq/L (ref 19–32)
CREATININE: 1.01 mg/dL (ref 0.50–1.35)
Calcium: 9.3 mg/dL (ref 8.4–10.5)
GFR calc Af Amer: 88 mL/min — ABNORMAL LOW (ref >90)
GFR, EST NON AFRICAN AMERICAN: 76 mL/min — AB (ref >90)
Glucose, Bld: 239 mg/dL — ABNORMAL HIGH (ref 70–99)
Potassium: 3.7 mEq/L (ref 3.7–5.3)
Sodium: 144 mEq/L (ref 137–147)
Total Protein: 6.5 g/dL (ref 6.0–8.3)

## 2013-10-21 LAB — CBC
HCT: 35.6 % — ABNORMAL LOW (ref 39.0–52.0)
Hemoglobin: 10.8 g/dL — ABNORMAL LOW (ref 13.0–17.0)
MCH: 25.5 pg — AB (ref 26.0–34.0)
MCHC: 30.3 g/dL (ref 30.0–36.0)
MCV: 84 fL (ref 78.0–100.0)
PLATELETS: 213 10*3/uL (ref 150–400)
RBC: 4.24 MIL/uL (ref 4.22–5.81)
RDW: 16 % — ABNORMAL HIGH (ref 11.5–15.5)
WBC: 8.5 10*3/uL (ref 4.0–10.5)

## 2013-10-21 LAB — PHOSPHORUS: Phosphorus: 2.9 mg/dL (ref 2.3–4.6)

## 2013-10-21 LAB — TRIGLYCERIDES: TRIGLYCERIDES: 130 mg/dL (ref <150)

## 2013-10-21 LAB — MAGNESIUM: Magnesium: 2.2 mg/dL (ref 1.5–2.5)

## 2013-10-21 LAB — PREALBUMIN: PREALBUMIN: 13.1 mg/dL — AB (ref 17.0–34.0)

## 2013-10-22 LAB — DIFFERENTIAL
Basophils Absolute: 0 10*3/uL (ref 0.0–0.1)
Basophils Relative: 0 % (ref 0–1)
EOS PCT: 4 % (ref 0–5)
Eosinophils Absolute: 0.3 10*3/uL (ref 0.0–0.7)
LYMPHS ABS: 2.3 10*3/uL (ref 0.7–4.0)
LYMPHS PCT: 29 % (ref 12–46)
MONOS PCT: 7 % (ref 3–12)
Monocytes Absolute: 0.6 10*3/uL (ref 0.1–1.0)
Neutro Abs: 4.8 10*3/uL (ref 1.7–7.7)
Neutrophils Relative %: 60 % (ref 43–77)

## 2013-10-22 LAB — COMPREHENSIVE METABOLIC PANEL
ALBUMIN: 2.4 g/dL — AB (ref 3.5–5.2)
ALT: 19 U/L (ref 0–53)
AST: 10 U/L (ref 0–37)
Alkaline Phosphatase: 80 U/L (ref 39–117)
BUN: 12 mg/dL (ref 6–23)
CALCIUM: 9.3 mg/dL (ref 8.4–10.5)
CO2: 30 mEq/L (ref 19–32)
Chloride: 109 mEq/L (ref 96–112)
Creatinine, Ser: 1.02 mg/dL (ref 0.50–1.35)
GFR calc non Af Amer: 75 mL/min — ABNORMAL LOW (ref >90)
GFR, EST AFRICAN AMERICAN: 87 mL/min — AB (ref >90)
Glucose, Bld: 237 mg/dL — ABNORMAL HIGH (ref 70–99)
POTASSIUM: 3.9 meq/L (ref 3.7–5.3)
SODIUM: 148 meq/L — AB (ref 137–147)
Total Bilirubin: 0.2 mg/dL — ABNORMAL LOW (ref 0.3–1.2)
Total Protein: 6.5 g/dL (ref 6.0–8.3)

## 2013-10-22 LAB — PHOSPHORUS: Phosphorus: 3.2 mg/dL (ref 2.3–4.6)

## 2013-10-22 LAB — CBC
HEMATOCRIT: 35.7 % — AB (ref 39.0–52.0)
HEMOGLOBIN: 10.9 g/dL — AB (ref 13.0–17.0)
MCH: 25.5 pg — ABNORMAL LOW (ref 26.0–34.0)
MCHC: 30.5 g/dL (ref 30.0–36.0)
MCV: 83.4 fL (ref 78.0–100.0)
Platelets: 208 10*3/uL (ref 150–400)
RBC: 4.28 MIL/uL (ref 4.22–5.81)
RDW: 15.8 % — ABNORMAL HIGH (ref 11.5–15.5)
WBC: 8 10*3/uL (ref 4.0–10.5)

## 2013-10-22 LAB — TRIGLYCERIDES: Triglycerides: 144 mg/dL (ref <150)

## 2013-10-22 LAB — MAGNESIUM: Magnesium: 2.1 mg/dL (ref 1.5–2.5)

## 2013-10-22 LAB — PREALBUMIN: PREALBUMIN: 13.8 mg/dL — AB (ref 17.0–34.0)

## 2013-10-23 ENCOUNTER — Other Ambulatory Visit (HOSPITAL_COMMUNITY): Payer: Self-pay

## 2013-10-23 ENCOUNTER — Other Ambulatory Visit (HOSPITAL_COMMUNITY): Payer: Medicare (Managed Care)

## 2013-10-24 ENCOUNTER — Other Ambulatory Visit (HOSPITAL_COMMUNITY): Payer: Self-pay

## 2013-10-24 DIAGNOSIS — R918 Other nonspecific abnormal finding of lung field: Secondary | ICD-10-CM | POA: Diagnosis not present

## 2013-10-24 DIAGNOSIS — N6489 Other specified disorders of breast: Secondary | ICD-10-CM | POA: Diagnosis not present

## 2013-10-24 LAB — COMPREHENSIVE METABOLIC PANEL
ALBUMIN: 2.2 g/dL — AB (ref 3.5–5.2)
ALK PHOS: 65 U/L (ref 39–117)
ALT: 18 U/L (ref 0–53)
AST: 46 U/L — ABNORMAL HIGH (ref 0–37)
BILIRUBIN TOTAL: 0.3 mg/dL (ref 0.3–1.2)
BUN: 13 mg/dL (ref 6–23)
CHLORIDE: 107 meq/L (ref 96–112)
CO2: 24 mEq/L (ref 19–32)
Calcium: 8.2 mg/dL — ABNORMAL LOW (ref 8.4–10.5)
Creatinine, Ser: 0.96 mg/dL (ref 0.50–1.35)
GFR calc Af Amer: >90 mL/min (ref >90)
GFR calc non Af Amer: 85 mL/min — ABNORMAL LOW (ref >90)
Glucose, Bld: 240 mg/dL — ABNORMAL HIGH (ref 70–99)
POTASSIUM: 5.5 meq/L — AB (ref 3.7–5.3)
Sodium: 142 mEq/L (ref 137–147)
Total Protein: 6.2 g/dL (ref 6.0–8.3)

## 2013-10-25 ENCOUNTER — Other Ambulatory Visit (HOSPITAL_COMMUNITY): Payer: Self-pay

## 2013-10-25 ENCOUNTER — Other Ambulatory Visit (HOSPITAL_COMMUNITY): Payer: Medicare (Managed Care)

## 2013-10-25 LAB — CBC WITH DIFFERENTIAL/PLATELET
Basophils Absolute: 0 10*3/uL (ref 0.0–0.1)
Basophils Relative: 0 % (ref 0–1)
Eosinophils Absolute: 0.3 10*3/uL (ref 0.0–0.7)
Eosinophils Relative: 4 % (ref 0–5)
HCT: 35.6 % — ABNORMAL LOW (ref 39.0–52.0)
Hemoglobin: 11 g/dL — ABNORMAL LOW (ref 13.0–17.0)
LYMPHS ABS: 2.4 10*3/uL (ref 0.7–4.0)
LYMPHS PCT: 27 % (ref 12–46)
MCH: 25.7 pg — ABNORMAL LOW (ref 26.0–34.0)
MCHC: 30.9 g/dL (ref 30.0–36.0)
MCV: 83.2 fL (ref 78.0–100.0)
Monocytes Absolute: 0.8 10*3/uL (ref 0.1–1.0)
Monocytes Relative: 9 % (ref 3–12)
Neutro Abs: 5.2 10*3/uL (ref 1.7–7.7)
Neutrophils Relative %: 59 % (ref 43–77)
Platelets: 173 10*3/uL (ref 150–400)
RBC: 4.28 MIL/uL (ref 4.22–5.81)
RDW: 16 % — ABNORMAL HIGH (ref 11.5–15.5)
WBC: 8.8 10*3/uL (ref 4.0–10.5)

## 2013-10-25 LAB — COMPREHENSIVE METABOLIC PANEL
ALT: 13 U/L (ref 0–53)
AST: 10 U/L (ref 0–37)
Albumin: 2.6 g/dL — ABNORMAL LOW (ref 3.5–5.2)
Alkaline Phosphatase: 84 U/L (ref 39–117)
BUN: 13 mg/dL (ref 6–23)
CALCIUM: 9.2 mg/dL (ref 8.4–10.5)
CO2: 27 mEq/L (ref 19–32)
Chloride: 102 mEq/L (ref 96–112)
Creatinine, Ser: 1.11 mg/dL (ref 0.50–1.35)
GFR calc non Af Amer: 68 mL/min — ABNORMAL LOW (ref >90)
GFR, EST AFRICAN AMERICAN: 79 mL/min — AB (ref >90)
GLUCOSE: 261 mg/dL — AB (ref 70–99)
Potassium: 3.4 mEq/L — ABNORMAL LOW (ref 3.7–5.3)
Sodium: 141 mEq/L (ref 137–147)
Total Bilirubin: 0.3 mg/dL (ref 0.3–1.2)
Total Protein: 6.7 g/dL (ref 6.0–8.3)

## 2013-10-25 LAB — PHOSPHORUS: PHOSPHORUS: 3 mg/dL (ref 2.3–4.6)

## 2013-10-25 LAB — MAGNESIUM: Magnesium: 2 mg/dL (ref 1.5–2.5)

## 2013-10-26 LAB — BASIC METABOLIC PANEL
BUN: 16 mg/dL (ref 6–23)
CO2: 26 mEq/L (ref 19–32)
Calcium: 9.3 mg/dL (ref 8.4–10.5)
Chloride: 105 mEq/L (ref 96–112)
Creatinine, Ser: 1.15 mg/dL (ref 0.50–1.35)
GFR calc Af Amer: 75 mL/min — ABNORMAL LOW (ref >90)
GFR, EST NON AFRICAN AMERICAN: 65 mL/min — AB (ref >90)
GLUCOSE: 321 mg/dL — AB (ref 70–99)
POTASSIUM: 4.2 meq/L (ref 3.7–5.3)
Sodium: 142 mEq/L (ref 137–147)

## 2013-10-27 LAB — BASIC METABOLIC PANEL WITH GFR
BUN: 21 mg/dL (ref 6–23)
CO2: 25 meq/L (ref 19–32)
Calcium: 9 mg/dL (ref 8.4–10.5)
Chloride: 103 meq/L (ref 96–112)
Creatinine, Ser: 1.12 mg/dL (ref 0.50–1.35)
GFR calc Af Amer: 78 mL/min — ABNORMAL LOW
GFR calc non Af Amer: 67 mL/min — ABNORMAL LOW
Glucose, Bld: 311 mg/dL — ABNORMAL HIGH (ref 70–99)
Potassium: 3.9 meq/L (ref 3.7–5.3)
Sodium: 140 meq/L (ref 137–147)

## 2013-10-27 LAB — CBC
HEMATOCRIT: 33.5 % — AB (ref 39.0–52.0)
HEMOGLOBIN: 10.2 g/dL — AB (ref 13.0–17.0)
MCH: 25.2 pg — ABNORMAL LOW (ref 26.0–34.0)
MCHC: 30.4 g/dL (ref 30.0–36.0)
MCV: 82.7 fL (ref 78.0–100.0)
Platelets: 163 10*3/uL (ref 150–400)
RBC: 4.05 MIL/uL — AB (ref 4.22–5.81)
RDW: 15.7 % — ABNORMAL HIGH (ref 11.5–15.5)
WBC: 8.5 10*3/uL (ref 4.0–10.5)

## 2013-10-29 ENCOUNTER — Other Ambulatory Visit (HOSPITAL_COMMUNITY): Payer: Medicare (Managed Care)

## 2013-10-29 DIAGNOSIS — R141 Gas pain: Secondary | ICD-10-CM | POA: Diagnosis not present

## 2013-10-29 LAB — CBC WITH DIFFERENTIAL/PLATELET
BASOS ABS: 0 10*3/uL (ref 0.0–0.1)
Basophils Relative: 0 % (ref 0–1)
Eosinophils Absolute: 0.2 10*3/uL (ref 0.0–0.7)
Eosinophils Relative: 3 % (ref 0–5)
HCT: 34.7 % — ABNORMAL LOW (ref 39.0–52.0)
Hemoglobin: 10.9 g/dL — ABNORMAL LOW (ref 13.0–17.0)
LYMPHS ABS: 1.9 10*3/uL (ref 0.7–4.0)
LYMPHS PCT: 23 % (ref 12–46)
MCH: 25.5 pg — ABNORMAL LOW (ref 26.0–34.0)
MCHC: 31.4 g/dL (ref 30.0–36.0)
MCV: 81.3 fL (ref 78.0–100.0)
Monocytes Absolute: 1 10*3/uL (ref 0.1–1.0)
Monocytes Relative: 11 % (ref 3–12)
NEUTROS ABS: 5.4 10*3/uL (ref 1.7–7.7)
NEUTROS PCT: 63 % (ref 43–77)
PLATELETS: 169 10*3/uL (ref 150–400)
RBC: 4.27 MIL/uL (ref 4.22–5.81)
RDW: 15.4 % (ref 11.5–15.5)
WBC: 8.6 10*3/uL (ref 4.0–10.5)

## 2013-10-29 LAB — COMPREHENSIVE METABOLIC PANEL
ALBUMIN: 2.4 g/dL — AB (ref 3.5–5.2)
ALT: 7 U/L (ref 0–53)
AST: 8 U/L (ref 0–37)
Alkaline Phosphatase: 85 U/L (ref 39–117)
BUN: 17 mg/dL (ref 6–23)
CALCIUM: 9.1 mg/dL (ref 8.4–10.5)
CO2: 25 mEq/L (ref 19–32)
Chloride: 101 mEq/L (ref 96–112)
Creatinine, Ser: 0.99 mg/dL (ref 0.50–1.35)
GFR calc Af Amer: >90 mL/min (ref >90)
GFR calc non Af Amer: 84 mL/min — ABNORMAL LOW (ref >90)
Glucose, Bld: 281 mg/dL — ABNORMAL HIGH (ref 70–99)
Potassium: 4 mEq/L (ref 3.7–5.3)
SODIUM: 138 meq/L (ref 137–147)
Total Bilirubin: 0.3 mg/dL (ref 0.3–1.2)
Total Protein: 6.8 g/dL (ref 6.0–8.3)

## 2013-10-29 LAB — MAGNESIUM: MAGNESIUM: 1.8 mg/dL (ref 1.5–2.5)

## 2013-10-29 LAB — TRIGLYCERIDES: TRIGLYCERIDES: 111 mg/dL (ref <150)

## 2013-10-29 LAB — PREALBUMIN: PREALBUMIN: 15.8 mg/dL — AB (ref 17.0–34.0)

## 2013-10-29 LAB — PHOSPHORUS: PHOSPHORUS: 2.8 mg/dL (ref 2.3–4.6)

## 2013-10-31 ENCOUNTER — Other Ambulatory Visit (HOSPITAL_COMMUNITY): Payer: Medicare (Managed Care)

## 2013-10-31 DIAGNOSIS — K560 Paralytic ileus: Secondary | ICD-10-CM | POA: Diagnosis not present

## 2013-10-31 LAB — CBC WITH DIFFERENTIAL/PLATELET
BASOS ABS: 0 10*3/uL (ref 0.0–0.1)
Basophils Relative: 0 % (ref 0–1)
EOS PCT: 2 % (ref 0–5)
Eosinophils Absolute: 0.2 10*3/uL (ref 0.0–0.7)
HEMATOCRIT: 32.8 % — AB (ref 39.0–52.0)
Hemoglobin: 10.3 g/dL — ABNORMAL LOW (ref 13.0–17.0)
LYMPHS PCT: 16 % (ref 12–46)
Lymphs Abs: 1.7 10*3/uL (ref 0.7–4.0)
MCH: 25.2 pg — ABNORMAL LOW (ref 26.0–34.0)
MCHC: 31.4 g/dL (ref 30.0–36.0)
MCV: 80.2 fL (ref 78.0–100.0)
MONO ABS: 1.3 10*3/uL — AB (ref 0.1–1.0)
Monocytes Relative: 12 % (ref 3–12)
NEUTROS ABS: 7.3 10*3/uL (ref 1.7–7.7)
Neutrophils Relative %: 69 % (ref 43–77)
PLATELETS: 193 10*3/uL (ref 150–400)
RBC: 4.09 MIL/uL — AB (ref 4.22–5.81)
RDW: 15.2 % (ref 11.5–15.5)
WBC: 10.5 10*3/uL (ref 4.0–10.5)

## 2013-10-31 LAB — BASIC METABOLIC PANEL
BUN: 19 mg/dL (ref 6–23)
CHLORIDE: 96 meq/L (ref 96–112)
CO2: 27 meq/L (ref 19–32)
Calcium: 9 mg/dL (ref 8.4–10.5)
Creatinine, Ser: 1.01 mg/dL (ref 0.50–1.35)
GFR calc Af Amer: 88 mL/min — ABNORMAL LOW (ref >90)
GFR calc non Af Amer: 76 mL/min — ABNORMAL LOW (ref >90)
Glucose, Bld: 305 mg/dL — ABNORMAL HIGH (ref 70–99)
Potassium: 4.3 mEq/L (ref 3.7–5.3)
SODIUM: 132 meq/L — AB (ref 137–147)

## 2013-10-31 LAB — MAGNESIUM: MAGNESIUM: 1.8 mg/dL (ref 1.5–2.5)

## 2013-10-31 LAB — PHOSPHORUS: Phosphorus: 3.4 mg/dL (ref 2.3–4.6)

## 2013-11-01 ENCOUNTER — Other Ambulatory Visit (HOSPITAL_COMMUNITY): Payer: Medicare (Managed Care)

## 2013-11-01 LAB — COMPREHENSIVE METABOLIC PANEL
ALBUMIN: 2 g/dL — AB (ref 3.5–5.2)
ALT: 7 U/L (ref 0–53)
AST: 9 U/L (ref 0–37)
Alkaline Phosphatase: 88 U/L (ref 39–117)
BILIRUBIN TOTAL: 0.4 mg/dL (ref 0.3–1.2)
BUN: 19 mg/dL (ref 6–23)
CHLORIDE: 94 meq/L — AB (ref 96–112)
CO2: 24 mEq/L (ref 19–32)
Calcium: 8.9 mg/dL (ref 8.4–10.5)
Creatinine, Ser: 1.01 mg/dL (ref 0.50–1.35)
GFR calc Af Amer: 88 mL/min — ABNORMAL LOW (ref >90)
GFR calc non Af Amer: 76 mL/min — ABNORMAL LOW (ref >90)
Glucose, Bld: 295 mg/dL — ABNORMAL HIGH (ref 70–99)
Potassium: 4.1 mEq/L (ref 3.7–5.3)
SODIUM: 131 meq/L — AB (ref 137–147)
TOTAL PROTEIN: 6.6 g/dL (ref 6.0–8.3)

## 2013-11-01 LAB — PHOSPHORUS: Phosphorus: 3.2 mg/dL (ref 2.3–4.6)

## 2013-11-01 LAB — MAGNESIUM: MAGNESIUM: 1.9 mg/dL (ref 1.5–2.5)

## 2013-11-02 ENCOUNTER — Other Ambulatory Visit (HOSPITAL_COMMUNITY): Payer: Medicare (Managed Care)

## 2013-11-02 DIAGNOSIS — J9600 Acute respiratory failure, unspecified whether with hypoxia or hypercapnia: Secondary | ICD-10-CM | POA: Diagnosis not present

## 2013-11-02 LAB — BASIC METABOLIC PANEL
BUN: 20 mg/dL (ref 6–23)
CHLORIDE: 97 meq/L (ref 96–112)
CO2: 26 meq/L (ref 19–32)
CREATININE: 1.09 mg/dL (ref 0.50–1.35)
Calcium: 8.9 mg/dL (ref 8.4–10.5)
GFR calc Af Amer: 80 mL/min — ABNORMAL LOW (ref >90)
GFR calc non Af Amer: 69 mL/min — ABNORMAL LOW (ref >90)
GLUCOSE: 275 mg/dL — AB (ref 70–99)
Potassium: 4.3 mEq/L (ref 3.7–5.3)
Sodium: 132 mEq/L — ABNORMAL LOW (ref 137–147)

## 2013-11-02 LAB — CBC WITH DIFFERENTIAL/PLATELET
BASOS PCT: 1 % (ref 0–1)
Basophils Absolute: 0.1 10*3/uL (ref 0.0–0.1)
EOS PCT: 2 % (ref 0–5)
Eosinophils Absolute: 0.2 10*3/uL (ref 0.0–0.7)
HEMATOCRIT: 31.6 % — AB (ref 39.0–52.0)
Hemoglobin: 10.1 g/dL — ABNORMAL LOW (ref 13.0–17.0)
Lymphocytes Relative: 14 % (ref 12–46)
Lymphs Abs: 1.4 10*3/uL (ref 0.7–4.0)
MCH: 25.4 pg — AB (ref 26.0–34.0)
MCHC: 32 g/dL (ref 30.0–36.0)
MCV: 79.6 fL (ref 78.0–100.0)
MONO ABS: 1.2 10*3/uL — AB (ref 0.1–1.0)
MONOS PCT: 13 % — AB (ref 3–12)
Neutro Abs: 6.8 10*3/uL (ref 1.7–7.7)
Neutrophils Relative %: 70 % (ref 43–77)
Platelets: 210 10*3/uL (ref 150–400)
RBC: 3.97 MIL/uL — ABNORMAL LOW (ref 4.22–5.81)
RDW: 15.1 % (ref 11.5–15.5)
WBC: 9.7 10*3/uL (ref 4.0–10.5)

## 2013-11-03 LAB — CBC WITH DIFFERENTIAL/PLATELET
BASOS ABS: 0 10*3/uL (ref 0.0–0.1)
BASOS PCT: 0 % (ref 0–1)
EOS ABS: 0.3 10*3/uL (ref 0.0–0.7)
Eosinophils Relative: 3 % (ref 0–5)
HCT: 31.6 % — ABNORMAL LOW (ref 39.0–52.0)
Hemoglobin: 10 g/dL — ABNORMAL LOW (ref 13.0–17.0)
Lymphocytes Relative: 24 % (ref 12–46)
Lymphs Abs: 2.1 10*3/uL (ref 0.7–4.0)
MCH: 25.1 pg — AB (ref 26.0–34.0)
MCHC: 31.6 g/dL (ref 30.0–36.0)
MCV: 79.4 fL (ref 78.0–100.0)
MONO ABS: 1.1 10*3/uL — AB (ref 0.1–1.0)
Monocytes Relative: 12 % (ref 3–12)
NEUTROS ABS: 5.2 10*3/uL (ref 1.7–7.7)
Neutrophils Relative %: 60 % (ref 43–77)
Platelets: 204 10*3/uL (ref 150–400)
RBC: 3.98 MIL/uL — ABNORMAL LOW (ref 4.22–5.81)
RDW: 15.2 % (ref 11.5–15.5)
WBC: 8.7 10*3/uL (ref 4.0–10.5)

## 2013-11-03 LAB — URINALYSIS, ROUTINE W REFLEX MICROSCOPIC
Bilirubin Urine: NEGATIVE
Glucose, UA: NEGATIVE mg/dL
Ketones, ur: NEGATIVE mg/dL
NITRITE: NEGATIVE
Protein, ur: NEGATIVE mg/dL
SPECIFIC GRAVITY, URINE: 1.02 (ref 1.005–1.030)
UROBILINOGEN UA: 0.2 mg/dL (ref 0.0–1.0)
pH: 5.5 (ref 5.0–8.0)

## 2013-11-03 LAB — URINE MICROSCOPIC-ADD ON

## 2013-11-04 LAB — URINE CULTURE

## 2013-11-04 LAB — CBC WITH DIFFERENTIAL/PLATELET
BASOS ABS: 0 10*3/uL (ref 0.0–0.1)
BASOS PCT: 0 % (ref 0–1)
Eosinophils Absolute: 0.2 10*3/uL (ref 0.0–0.7)
Eosinophils Relative: 2 % (ref 0–5)
HCT: 31.5 % — ABNORMAL LOW (ref 39.0–52.0)
HEMOGLOBIN: 9.8 g/dL — AB (ref 13.0–17.0)
Lymphocytes Relative: 23 % (ref 12–46)
Lymphs Abs: 2.5 10*3/uL (ref 0.7–4.0)
MCH: 24.7 pg — ABNORMAL LOW (ref 26.0–34.0)
MCHC: 31.1 g/dL (ref 30.0–36.0)
MCV: 79.5 fL (ref 78.0–100.0)
MONOS PCT: 11 % (ref 3–12)
Monocytes Absolute: 1.2 10*3/uL — ABNORMAL HIGH (ref 0.1–1.0)
NEUTROS PCT: 64 % (ref 43–77)
Neutro Abs: 6.9 10*3/uL (ref 1.7–7.7)
Platelets: 216 10*3/uL (ref 150–400)
RBC: 3.96 MIL/uL — ABNORMAL LOW (ref 4.22–5.81)
RDW: 15.2 % (ref 11.5–15.5)
WBC: 10.8 10*3/uL — ABNORMAL HIGH (ref 4.0–10.5)

## 2013-11-04 LAB — URINALYSIS, ROUTINE W REFLEX MICROSCOPIC
BILIRUBIN URINE: NEGATIVE
Glucose, UA: NEGATIVE mg/dL
HGB URINE DIPSTICK: NEGATIVE
Ketones, ur: NEGATIVE mg/dL
Nitrite: NEGATIVE
PH: 5.5 (ref 5.0–8.0)
PROTEIN: NEGATIVE mg/dL
Specific Gravity, Urine: 1.018 (ref 1.005–1.030)
Urobilinogen, UA: 0.2 mg/dL (ref 0.0–1.0)

## 2013-11-04 LAB — URINE MICROSCOPIC-ADD ON

## 2013-11-04 LAB — BASIC METABOLIC PANEL
BUN: 17 mg/dL (ref 6–23)
CO2: 26 mEq/L (ref 19–32)
Calcium: 8.8 mg/dL (ref 8.4–10.5)
Chloride: 96 mEq/L (ref 96–112)
Creatinine, Ser: 1.05 mg/dL (ref 0.50–1.35)
GFR, EST AFRICAN AMERICAN: 84 mL/min — AB (ref >90)
GFR, EST NON AFRICAN AMERICAN: 73 mL/min — AB (ref >90)
Glucose, Bld: 235 mg/dL — ABNORMAL HIGH (ref 70–99)
POTASSIUM: 4.1 meq/L (ref 3.7–5.3)
Sodium: 132 mEq/L — ABNORMAL LOW (ref 137–147)

## 2013-11-05 ENCOUNTER — Institutional Professional Consult (permissible substitution) (HOSPITAL_COMMUNITY): Payer: Medicare (Managed Care)

## 2013-11-05 LAB — URINE CULTURE
CULTURE: NO GROWTH
Colony Count: NO GROWTH

## 2013-11-05 LAB — CBC WITH DIFFERENTIAL/PLATELET
BASOS ABS: 0 10*3/uL (ref 0.0–0.1)
BASOS PCT: 0 % (ref 0–1)
Eosinophils Absolute: 0.3 10*3/uL (ref 0.0–0.7)
Eosinophils Relative: 3 % (ref 0–5)
HEMATOCRIT: 30 % — AB (ref 39.0–52.0)
HEMOGLOBIN: 9.5 g/dL — AB (ref 13.0–17.0)
Lymphocytes Relative: 21 % (ref 12–46)
Lymphs Abs: 2.4 10*3/uL (ref 0.7–4.0)
MCH: 25.3 pg — ABNORMAL LOW (ref 26.0–34.0)
MCHC: 31.7 g/dL (ref 30.0–36.0)
MCV: 79.8 fL (ref 78.0–100.0)
MONOS PCT: 9 % (ref 3–12)
Monocytes Absolute: 1 10*3/uL (ref 0.1–1.0)
NEUTROS ABS: 7.7 10*3/uL (ref 1.7–7.7)
NEUTROS PCT: 67 % (ref 43–77)
Platelets: 224 10*3/uL (ref 150–400)
RBC: 3.76 MIL/uL — ABNORMAL LOW (ref 4.22–5.81)
RDW: 15.2 % (ref 11.5–15.5)
WBC: 11.5 10*3/uL — AB (ref 4.0–10.5)

## 2013-11-05 LAB — COMPREHENSIVE METABOLIC PANEL
ALBUMIN: 2 g/dL — AB (ref 3.5–5.2)
ALK PHOS: 87 U/L (ref 39–117)
ALT: 10 U/L (ref 0–53)
AST: 10 U/L (ref 0–37)
BUN: 15 mg/dL (ref 6–23)
CO2: 26 mEq/L (ref 19–32)
Calcium: 8.8 mg/dL (ref 8.4–10.5)
Chloride: 99 mEq/L (ref 96–112)
Creatinine, Ser: 0.99 mg/dL (ref 0.50–1.35)
GFR calc Af Amer: >90 mL/min (ref >90)
GFR, EST NON AFRICAN AMERICAN: 84 mL/min — AB (ref >90)
Glucose, Bld: 218 mg/dL — ABNORMAL HIGH (ref 70–99)
POTASSIUM: 3.9 meq/L (ref 3.7–5.3)
Sodium: 135 mEq/L — ABNORMAL LOW (ref 137–147)
Total Bilirubin: 0.3 mg/dL (ref 0.3–1.2)
Total Protein: 6.5 g/dL (ref 6.0–8.3)

## 2013-11-05 LAB — PROCALCITONIN: PROCALCITONIN: 0.11 ng/mL

## 2013-11-06 LAB — CBC WITH DIFFERENTIAL/PLATELET
BASOS PCT: 0 % (ref 0–1)
Basophils Absolute: 0 10*3/uL (ref 0.0–0.1)
Eosinophils Absolute: 0.3 10*3/uL (ref 0.0–0.7)
Eosinophils Relative: 3 % (ref 0–5)
HEMATOCRIT: 30.2 % — AB (ref 39.0–52.0)
HEMOGLOBIN: 9.6 g/dL — AB (ref 13.0–17.0)
Lymphocytes Relative: 20 % (ref 12–46)
Lymphs Abs: 1.9 10*3/uL (ref 0.7–4.0)
MCH: 25.5 pg — ABNORMAL LOW (ref 26.0–34.0)
MCHC: 31.8 g/dL (ref 30.0–36.0)
MCV: 80.3 fL (ref 78.0–100.0)
MONO ABS: 0.8 10*3/uL (ref 0.1–1.0)
MONOS PCT: 8 % (ref 3–12)
Neutro Abs: 6.6 10*3/uL (ref 1.7–7.7)
Neutrophils Relative %: 69 % (ref 43–77)
Platelets: 206 10*3/uL (ref 150–400)
RBC: 3.76 MIL/uL — ABNORMAL LOW (ref 4.22–5.81)
RDW: 15.3 % (ref 11.5–15.5)
WBC: 9.5 10*3/uL (ref 4.0–10.5)

## 2013-11-07 ENCOUNTER — Other Ambulatory Visit (HOSPITAL_COMMUNITY): Payer: Self-pay

## 2013-11-07 ENCOUNTER — Other Ambulatory Visit (HOSPITAL_COMMUNITY): Payer: Medicare (Managed Care)

## 2013-11-07 DIAGNOSIS — Z452 Encounter for adjustment and management of vascular access device: Secondary | ICD-10-CM | POA: Diagnosis not present

## 2013-11-07 DIAGNOSIS — R918 Other nonspecific abnormal finding of lung field: Secondary | ICD-10-CM | POA: Diagnosis not present

## 2013-11-07 LAB — PREALBUMIN: Prealbumin: 11.2 mg/dL — ABNORMAL LOW (ref 17.0–34.0)

## 2013-11-08 LAB — CBC WITH DIFFERENTIAL/PLATELET
Basophils Absolute: 0 10*3/uL (ref 0.0–0.1)
Basophils Relative: 0 % (ref 0–1)
Eosinophils Absolute: 0.4 10*3/uL (ref 0.0–0.7)
Eosinophils Relative: 4 % (ref 0–5)
HCT: 34.6 % — ABNORMAL LOW (ref 39.0–52.0)
Hemoglobin: 10.8 g/dL — ABNORMAL LOW (ref 13.0–17.0)
LYMPHS ABS: 2.1 10*3/uL (ref 0.7–4.0)
LYMPHS PCT: 21 % (ref 12–46)
MCH: 25.1 pg — ABNORMAL LOW (ref 26.0–34.0)
MCHC: 31.2 g/dL (ref 30.0–36.0)
MCV: 80.5 fL (ref 78.0–100.0)
Monocytes Absolute: 0.9 10*3/uL (ref 0.1–1.0)
Monocytes Relative: 9 % (ref 3–12)
Neutro Abs: 6.5 10*3/uL (ref 1.7–7.7)
Neutrophils Relative %: 65 % (ref 43–77)
Platelets: 229 10*3/uL (ref 150–400)
RBC: 4.3 MIL/uL (ref 4.22–5.81)
RDW: 15.3 % (ref 11.5–15.5)
WBC: 9.9 10*3/uL (ref 4.0–10.5)

## 2013-11-08 LAB — BASIC METABOLIC PANEL
BUN: 21 mg/dL (ref 6–23)
CO2: 24 mEq/L (ref 19–32)
Calcium: 9.2 mg/dL (ref 8.4–10.5)
Chloride: 96 mEq/L (ref 96–112)
Creatinine, Ser: 1.19 mg/dL (ref 0.50–1.35)
GFR calc Af Amer: 72 mL/min — ABNORMAL LOW (ref >90)
GFR calc non Af Amer: 62 mL/min — ABNORMAL LOW (ref >90)
GLUCOSE: 166 mg/dL — AB (ref 70–99)
POTASSIUM: 4.7 meq/L (ref 3.7–5.3)
Sodium: 134 mEq/L — ABNORMAL LOW (ref 137–147)

## 2013-11-08 LAB — PROCALCITONIN: Procalcitonin: 0.41 ng/mL

## 2013-11-09 LAB — CBC WITH DIFFERENTIAL/PLATELET
Basophils Absolute: 0 10*3/uL (ref 0.0–0.1)
Basophils Relative: 0 % (ref 0–1)
Eosinophils Absolute: 0.5 10*3/uL (ref 0.0–0.7)
Eosinophils Relative: 4 % (ref 0–5)
HCT: 31.1 % — ABNORMAL LOW (ref 39.0–52.0)
HEMOGLOBIN: 9.9 g/dL — AB (ref 13.0–17.0)
LYMPHS ABS: 2.2 10*3/uL (ref 0.7–4.0)
LYMPHS PCT: 21 % (ref 12–46)
MCH: 25.3 pg — ABNORMAL LOW (ref 26.0–34.0)
MCHC: 31.8 g/dL (ref 30.0–36.0)
MCV: 79.3 fL (ref 78.0–100.0)
MONOS PCT: 10 % (ref 3–12)
Monocytes Absolute: 1 10*3/uL (ref 0.1–1.0)
NEUTROS ABS: 7 10*3/uL (ref 1.7–7.7)
NEUTROS PCT: 65 % (ref 43–77)
Platelets: 194 10*3/uL (ref 150–400)
RBC: 3.92 MIL/uL — AB (ref 4.22–5.81)
RDW: 15.2 % (ref 11.5–15.5)
WBC: 10.6 10*3/uL — AB (ref 4.0–10.5)

## 2013-11-09 LAB — CULTURE, BLOOD (ROUTINE X 2)

## 2013-11-09 LAB — BASIC METABOLIC PANEL
BUN: 21 mg/dL (ref 6–23)
CHLORIDE: 97 meq/L (ref 96–112)
CO2: 25 meq/L (ref 19–32)
Calcium: 8.9 mg/dL (ref 8.4–10.5)
Creatinine, Ser: 1.14 mg/dL (ref 0.50–1.35)
GFR calc Af Amer: 76 mL/min — ABNORMAL LOW (ref >90)
GFR calc non Af Amer: 66 mL/min — ABNORMAL LOW (ref >90)
GLUCOSE: 171 mg/dL — AB (ref 70–99)
POTASSIUM: 4.6 meq/L (ref 3.7–5.3)
Sodium: 133 mEq/L — ABNORMAL LOW (ref 137–147)

## 2013-11-09 LAB — VANCOMYCIN, TROUGH
Vancomycin Tr: 25.6 ug/mL (ref 10.0–20.0)
Vancomycin Tr: 18.4 ug/mL (ref 10.0–20.0)

## 2013-11-10 LAB — BASIC METABOLIC PANEL
BUN: 19 mg/dL (ref 6–23)
CHLORIDE: 98 meq/L (ref 96–112)
CO2: 25 meq/L (ref 19–32)
CREATININE: 1.09 mg/dL (ref 0.50–1.35)
Calcium: 9.1 mg/dL (ref 8.4–10.5)
GFR calc Af Amer: 80 mL/min — ABNORMAL LOW (ref >90)
GFR calc non Af Amer: 69 mL/min — ABNORMAL LOW (ref >90)
Glucose, Bld: 80 mg/dL (ref 70–99)
Potassium: 4.3 mEq/L (ref 3.7–5.3)
Sodium: 135 mEq/L — ABNORMAL LOW (ref 137–147)

## 2013-11-11 LAB — CBC
HEMATOCRIT: 30.9 % — AB (ref 39.0–52.0)
HEMOGLOBIN: 9.8 g/dL — AB (ref 13.0–17.0)
MCH: 25.3 pg — ABNORMAL LOW (ref 26.0–34.0)
MCHC: 31.7 g/dL (ref 30.0–36.0)
MCV: 79.6 fL (ref 78.0–100.0)
Platelets: 179 10*3/uL (ref 150–400)
RBC: 3.88 MIL/uL — ABNORMAL LOW (ref 4.22–5.81)
RDW: 15.4 % (ref 11.5–15.5)
WBC: 9.6 10*3/uL (ref 4.0–10.5)

## 2013-11-11 LAB — BASIC METABOLIC PANEL
BUN: 17 mg/dL (ref 6–23)
CHLORIDE: 96 meq/L (ref 96–112)
CO2: 25 mEq/L (ref 19–32)
CREATININE: 1.09 mg/dL (ref 0.50–1.35)
Calcium: 8.6 mg/dL (ref 8.4–10.5)
GFR calc Af Amer: 80 mL/min — ABNORMAL LOW (ref >90)
GFR calc non Af Amer: 69 mL/min — ABNORMAL LOW (ref >90)
Glucose, Bld: 149 mg/dL — ABNORMAL HIGH (ref 70–99)
POTASSIUM: 4.4 meq/L (ref 3.7–5.3)
Sodium: 132 mEq/L — ABNORMAL LOW (ref 137–147)

## 2013-11-11 LAB — CATH TIP CULTURE
Culture: >100
SPECIAL REQUESTS: NORMAL

## 2013-11-12 ENCOUNTER — Encounter: Payer: Self-pay | Admitting: Cardiovascular Disease

## 2013-11-12 LAB — BASIC METABOLIC PANEL
BUN: 17 mg/dL (ref 6–23)
CALCIUM: 9 mg/dL (ref 8.4–10.5)
CO2: 26 meq/L (ref 19–32)
Chloride: 96 mEq/L (ref 96–112)
Creatinine, Ser: 1.04 mg/dL (ref 0.50–1.35)
GFR calc Af Amer: 85 mL/min — ABNORMAL LOW (ref >90)
GFR, EST NON AFRICAN AMERICAN: 73 mL/min — AB (ref >90)
Glucose, Bld: 140 mg/dL — ABNORMAL HIGH (ref 70–99)
POTASSIUM: 4.4 meq/L (ref 3.7–5.3)
Sodium: 134 mEq/L — ABNORMAL LOW (ref 137–147)

## 2013-11-12 LAB — PREALBUMIN: PREALBUMIN: 11.7 mg/dL — AB (ref 17.0–34.0)

## 2013-11-14 LAB — BASIC METABOLIC PANEL
BUN: 21 mg/dL (ref 6–23)
CALCIUM: 9.2 mg/dL (ref 8.4–10.5)
CO2: 27 mEq/L (ref 19–32)
CREATININE: 1.12 mg/dL (ref 0.50–1.35)
Chloride: 99 mEq/L (ref 96–112)
GFR calc non Af Amer: 67 mL/min — ABNORMAL LOW (ref >90)
GFR, EST AFRICAN AMERICAN: 78 mL/min — AB (ref >90)
Glucose, Bld: 152 mg/dL — ABNORMAL HIGH (ref 70–99)
Potassium: 4.5 mEq/L (ref 3.7–5.3)
Sodium: 136 mEq/L — ABNORMAL LOW (ref 137–147)

## 2013-11-14 LAB — CBC
HCT: 31.3 % — ABNORMAL LOW (ref 39.0–52.0)
Hemoglobin: 9.7 g/dL — ABNORMAL LOW (ref 13.0–17.0)
MCH: 25 pg — AB (ref 26.0–34.0)
MCHC: 31 g/dL (ref 30.0–36.0)
MCV: 80.7 fL (ref 78.0–100.0)
PLATELETS: 168 10*3/uL (ref 150–400)
RBC: 3.88 MIL/uL — ABNORMAL LOW (ref 4.22–5.81)
RDW: 15.7 % — AB (ref 11.5–15.5)
WBC: 11.8 10*3/uL — ABNORMAL HIGH (ref 4.0–10.5)

## 2013-11-14 LAB — CULTURE, BLOOD (ROUTINE X 2)
CULTURE: NO GROWTH
Culture: NO GROWTH

## 2013-11-14 LAB — VANCOMYCIN, TROUGH: Vancomycin Tr: 29.8 ug/mL (ref 10.0–20.0)

## 2013-11-15 ENCOUNTER — Other Ambulatory Visit (HOSPITAL_COMMUNITY): Payer: Medicare Other

## 2013-11-15 DIAGNOSIS — I4891 Unspecified atrial fibrillation: Secondary | ICD-10-CM | POA: Diagnosis present

## 2013-11-15 DIAGNOSIS — R509 Fever, unspecified: Secondary | ICD-10-CM | POA: Diagnosis not present

## 2013-11-15 DIAGNOSIS — A4159 Other Gram-negative sepsis: Secondary | ICD-10-CM | POA: Diagnosis present

## 2013-11-15 DIAGNOSIS — M25569 Pain in unspecified knee: Secondary | ICD-10-CM | POA: Diagnosis present

## 2013-11-15 DIAGNOSIS — R609 Edema, unspecified: Secondary | ICD-10-CM | POA: Diagnosis not present

## 2013-11-15 DIAGNOSIS — I2510 Atherosclerotic heart disease of native coronary artery without angina pectoris: Secondary | ICD-10-CM | POA: Diagnosis present

## 2013-11-15 DIAGNOSIS — I619 Nontraumatic intracerebral hemorrhage, unspecified: Secondary | ICD-10-CM | POA: Diagnosis not present

## 2013-11-15 DIAGNOSIS — N19 Unspecified kidney failure: Secondary | ICD-10-CM | POA: Diagnosis not present

## 2013-11-15 DIAGNOSIS — F09 Unspecified mental disorder due to known physiological condition: Secondary | ICD-10-CM | POA: Diagnosis not present

## 2013-11-15 DIAGNOSIS — M159 Polyosteoarthritis, unspecified: Secondary | ICD-10-CM | POA: Diagnosis not present

## 2013-11-15 DIAGNOSIS — R059 Cough, unspecified: Secondary | ICD-10-CM | POA: Diagnosis not present

## 2013-11-15 DIAGNOSIS — J9620 Acute and chronic respiratory failure, unspecified whether with hypoxia or hypercapnia: Secondary | ICD-10-CM | POA: Diagnosis present

## 2013-11-15 DIAGNOSIS — I517 Cardiomegaly: Secondary | ICD-10-CM | POA: Diagnosis not present

## 2013-11-15 DIAGNOSIS — Z5189 Encounter for other specified aftercare: Secondary | ICD-10-CM | POA: Diagnosis not present

## 2013-11-15 DIAGNOSIS — M6281 Muscle weakness (generalized): Secondary | ICD-10-CM | POA: Diagnosis not present

## 2013-11-15 DIAGNOSIS — I2699 Other pulmonary embolism without acute cor pulmonale: Secondary | ICD-10-CM | POA: Diagnosis not present

## 2013-11-15 DIAGNOSIS — I2789 Other specified pulmonary heart diseases: Secondary | ICD-10-CM | POA: Diagnosis present

## 2013-11-15 DIAGNOSIS — E1139 Type 2 diabetes mellitus with other diabetic ophthalmic complication: Secondary | ICD-10-CM | POA: Diagnosis present

## 2013-11-15 DIAGNOSIS — F411 Generalized anxiety disorder: Secondary | ICD-10-CM | POA: Diagnosis not present

## 2013-11-15 DIAGNOSIS — N476 Balanoposthitis: Secondary | ICD-10-CM | POA: Diagnosis not present

## 2013-11-15 DIAGNOSIS — N390 Urinary tract infection, site not specified: Secondary | ICD-10-CM | POA: Diagnosis not present

## 2013-11-15 DIAGNOSIS — IMO0002 Reserved for concepts with insufficient information to code with codable children: Secondary | ICD-10-CM | POA: Diagnosis not present

## 2013-11-15 DIAGNOSIS — R0989 Other specified symptoms and signs involving the circulatory and respiratory systems: Secondary | ICD-10-CM | POA: Diagnosis not present

## 2013-11-15 DIAGNOSIS — E119 Type 2 diabetes mellitus without complications: Secondary | ICD-10-CM | POA: Diagnosis not present

## 2013-11-15 DIAGNOSIS — N281 Cyst of kidney, acquired: Secondary | ICD-10-CM | POA: Diagnosis present

## 2013-11-15 DIAGNOSIS — I6990 Unspecified sequelae of unspecified cerebrovascular disease: Secondary | ICD-10-CM | POA: Diagnosis not present

## 2013-11-15 DIAGNOSIS — J9503 Malfunction of tracheostomy stoma: Secondary | ICD-10-CM | POA: Diagnosis not present

## 2013-11-15 DIAGNOSIS — M109 Gout, unspecified: Secondary | ICD-10-CM | POA: Diagnosis not present

## 2013-11-15 DIAGNOSIS — Z6841 Body Mass Index (BMI) 40.0 and over, adult: Secondary | ICD-10-CM | POA: Diagnosis not present

## 2013-11-15 DIAGNOSIS — I10 Essential (primary) hypertension: Secondary | ICD-10-CM | POA: Diagnosis not present

## 2013-11-15 DIAGNOSIS — I872 Venous insufficiency (chronic) (peripheral): Secondary | ICD-10-CM | POA: Diagnosis present

## 2013-11-15 DIAGNOSIS — G4700 Insomnia, unspecified: Secondary | ICD-10-CM | POA: Diagnosis not present

## 2013-11-15 DIAGNOSIS — R4182 Altered mental status, unspecified: Secondary | ICD-10-CM | POA: Diagnosis not present

## 2013-11-15 DIAGNOSIS — I4729 Other ventricular tachycardia: Secondary | ICD-10-CM | POA: Diagnosis not present

## 2013-11-15 DIAGNOSIS — B961 Klebsiella pneumoniae [K. pneumoniae] as the cause of diseases classified elsewhere: Secondary | ICD-10-CM | POA: Diagnosis present

## 2013-11-15 DIAGNOSIS — J449 Chronic obstructive pulmonary disease, unspecified: Secondary | ICD-10-CM | POA: Diagnosis present

## 2013-11-15 DIAGNOSIS — Z79899 Other long term (current) drug therapy: Secondary | ICD-10-CM | POA: Diagnosis not present

## 2013-11-15 DIAGNOSIS — I5033 Acute on chronic diastolic (congestive) heart failure: Secondary | ICD-10-CM | POA: Diagnosis not present

## 2013-11-15 DIAGNOSIS — A412 Sepsis due to unspecified staphylococcus: Secondary | ICD-10-CM | POA: Diagnosis not present

## 2013-11-15 DIAGNOSIS — R05 Cough: Secondary | ICD-10-CM | POA: Diagnosis not present

## 2013-11-15 DIAGNOSIS — A419 Sepsis, unspecified organism: Secondary | ICD-10-CM | POA: Diagnosis present

## 2013-11-15 DIAGNOSIS — J189 Pneumonia, unspecified organism: Secondary | ICD-10-CM | POA: Diagnosis not present

## 2013-11-15 DIAGNOSIS — I629 Nontraumatic intracranial hemorrhage, unspecified: Secondary | ICD-10-CM | POA: Diagnosis not present

## 2013-11-15 DIAGNOSIS — E876 Hypokalemia: Secondary | ICD-10-CM | POA: Diagnosis not present

## 2013-11-15 DIAGNOSIS — H543 Unqualified visual loss, both eyes: Secondary | ICD-10-CM | POA: Diagnosis present

## 2013-11-15 DIAGNOSIS — J9600 Acute respiratory failure, unspecified whether with hypoxia or hypercapnia: Secondary | ICD-10-CM | POA: Diagnosis not present

## 2013-11-15 DIAGNOSIS — R52 Pain, unspecified: Secondary | ICD-10-CM | POA: Diagnosis not present

## 2013-11-15 DIAGNOSIS — IMO0001 Reserved for inherently not codable concepts without codable children: Secondary | ICD-10-CM | POA: Diagnosis not present

## 2013-11-15 DIAGNOSIS — R488 Other symbolic dysfunctions: Secondary | ICD-10-CM | POA: Diagnosis not present

## 2013-11-15 DIAGNOSIS — R6520 Severe sepsis without septic shock: Secondary | ICD-10-CM | POA: Diagnosis present

## 2013-11-15 DIAGNOSIS — H539 Unspecified visual disturbance: Secondary | ICD-10-CM | POA: Diagnosis not present

## 2013-11-15 DIAGNOSIS — I69920 Aphasia following unspecified cerebrovascular disease: Secondary | ICD-10-CM | POA: Diagnosis not present

## 2013-11-15 DIAGNOSIS — G9349 Other encephalopathy: Secondary | ICD-10-CM | POA: Diagnosis present

## 2013-11-15 DIAGNOSIS — Z930 Tracheostomy status: Secondary | ICD-10-CM | POA: Diagnosis not present

## 2013-11-15 DIAGNOSIS — Z7401 Bed confinement status: Secondary | ICD-10-CM | POA: Diagnosis not present

## 2013-11-15 DIAGNOSIS — R7881 Bacteremia: Secondary | ICD-10-CM | POA: Diagnosis not present

## 2013-11-15 DIAGNOSIS — I472 Ventricular tachycardia: Secondary | ICD-10-CM | POA: Diagnosis not present

## 2013-11-15 DIAGNOSIS — R319 Hematuria, unspecified: Secondary | ICD-10-CM | POA: Diagnosis not present

## 2013-11-15 DIAGNOSIS — E11319 Type 2 diabetes mellitus with unspecified diabetic retinopathy without macular edema: Secondary | ICD-10-CM | POA: Diagnosis present

## 2013-11-15 DIAGNOSIS — R0609 Other forms of dyspnea: Secondary | ICD-10-CM | POA: Diagnosis not present

## 2013-11-15 DIAGNOSIS — R1312 Dysphagia, oropharyngeal phase: Secondary | ICD-10-CM | POA: Diagnosis not present

## 2013-11-15 DIAGNOSIS — G9341 Metabolic encephalopathy: Secondary | ICD-10-CM | POA: Diagnosis not present

## 2013-11-15 DIAGNOSIS — R5381 Other malaise: Secondary | ICD-10-CM | POA: Diagnosis not present

## 2013-11-15 DIAGNOSIS — M129 Arthropathy, unspecified: Secondary | ICD-10-CM | POA: Diagnosis present

## 2013-11-15 DIAGNOSIS — R404 Transient alteration of awareness: Secondary | ICD-10-CM | POA: Diagnosis not present

## 2013-11-15 LAB — CULTURE, BLOOD (ROUTINE X 2)
CULTURE: NO GROWTH
Culture: NO GROWTH

## 2013-11-15 LAB — CBC
HCT: 32 % — ABNORMAL LOW (ref 39.0–52.0)
Hemoglobin: 9.9 g/dL — ABNORMAL LOW (ref 13.0–17.0)
MCH: 25.2 pg — ABNORMAL LOW (ref 26.0–34.0)
MCHC: 30.9 g/dL (ref 30.0–36.0)
MCV: 81.4 fL (ref 78.0–100.0)
PLATELETS: 189 10*3/uL (ref 150–400)
RBC: 3.93 MIL/uL — ABNORMAL LOW (ref 4.22–5.81)
RDW: 15.9 % — AB (ref 11.5–15.5)
WBC: 12.2 10*3/uL — AB (ref 4.0–10.5)

## 2013-11-15 LAB — BASIC METABOLIC PANEL
BUN: 23 mg/dL (ref 6–23)
CHLORIDE: 100 meq/L (ref 96–112)
CO2: 28 mEq/L (ref 19–32)
Calcium: 8.9 mg/dL (ref 8.4–10.5)
Creatinine, Ser: 1.24 mg/dL (ref 0.50–1.35)
GFR, EST AFRICAN AMERICAN: 69 mL/min — AB (ref >90)
GFR, EST NON AFRICAN AMERICAN: 59 mL/min — AB (ref >90)
Glucose, Bld: 148 mg/dL — ABNORMAL HIGH (ref 70–99)
POTASSIUM: 4.5 meq/L (ref 3.7–5.3)
Sodium: 138 mEq/L (ref 137–147)

## 2013-11-19 DIAGNOSIS — M159 Polyosteoarthritis, unspecified: Secondary | ICD-10-CM | POA: Diagnosis not present

## 2013-11-19 DIAGNOSIS — IMO0002 Reserved for concepts with insufficient information to code with codable children: Secondary | ICD-10-CM | POA: Diagnosis not present

## 2013-11-19 DIAGNOSIS — M109 Gout, unspecified: Secondary | ICD-10-CM | POA: Diagnosis not present

## 2013-11-19 DIAGNOSIS — R059 Cough, unspecified: Secondary | ICD-10-CM | POA: Diagnosis not present

## 2013-11-19 DIAGNOSIS — R05 Cough: Secondary | ICD-10-CM | POA: Diagnosis not present

## 2013-11-21 DIAGNOSIS — R4182 Altered mental status, unspecified: Secondary | ICD-10-CM | POA: Diagnosis not present

## 2013-11-21 DIAGNOSIS — R52 Pain, unspecified: Secondary | ICD-10-CM | POA: Diagnosis not present

## 2013-11-21 DIAGNOSIS — IMO0001 Reserved for inherently not codable concepts without codable children: Secondary | ICD-10-CM | POA: Diagnosis not present

## 2013-11-26 DIAGNOSIS — R52 Pain, unspecified: Secondary | ICD-10-CM | POA: Diagnosis not present

## 2013-11-26 DIAGNOSIS — R609 Edema, unspecified: Secondary | ICD-10-CM | POA: Diagnosis not present

## 2013-11-26 DIAGNOSIS — IMO0001 Reserved for inherently not codable concepts without codable children: Secondary | ICD-10-CM | POA: Diagnosis not present

## 2013-11-27 DIAGNOSIS — N476 Balanoposthitis: Secondary | ICD-10-CM | POA: Diagnosis not present

## 2013-11-27 DIAGNOSIS — J9600 Acute respiratory failure, unspecified whether with hypoxia or hypercapnia: Secondary | ICD-10-CM | POA: Diagnosis not present

## 2013-11-27 DIAGNOSIS — E876 Hypokalemia: Secondary | ICD-10-CM | POA: Diagnosis not present

## 2013-11-27 DIAGNOSIS — G4700 Insomnia, unspecified: Secondary | ICD-10-CM | POA: Diagnosis not present

## 2013-11-27 DIAGNOSIS — R609 Edema, unspecified: Secondary | ICD-10-CM | POA: Diagnosis not present

## 2013-11-27 DIAGNOSIS — J9503 Malfunction of tracheostomy stoma: Secondary | ICD-10-CM | POA: Diagnosis not present

## 2013-11-28 DIAGNOSIS — R609 Edema, unspecified: Secondary | ICD-10-CM | POA: Diagnosis not present

## 2013-12-03 DIAGNOSIS — R609 Edema, unspecified: Secondary | ICD-10-CM | POA: Diagnosis not present

## 2013-12-03 DIAGNOSIS — F411 Generalized anxiety disorder: Secondary | ICD-10-CM | POA: Diagnosis not present

## 2013-12-10 ENCOUNTER — Encounter: Payer: Medicare (Managed Care) | Admitting: Cardiovascular Disease

## 2013-12-10 DIAGNOSIS — G4700 Insomnia, unspecified: Secondary | ICD-10-CM | POA: Diagnosis not present

## 2013-12-10 DIAGNOSIS — R609 Edema, unspecified: Secondary | ICD-10-CM | POA: Diagnosis not present

## 2013-12-11 DIAGNOSIS — R609 Edema, unspecified: Secondary | ICD-10-CM | POA: Diagnosis not present

## 2013-12-11 DIAGNOSIS — J9600 Acute respiratory failure, unspecified whether with hypoxia or hypercapnia: Secondary | ICD-10-CM | POA: Diagnosis not present

## 2013-12-11 DIAGNOSIS — G4700 Insomnia, unspecified: Secondary | ICD-10-CM | POA: Diagnosis not present

## 2013-12-11 DIAGNOSIS — J9503 Malfunction of tracheostomy stoma: Secondary | ICD-10-CM | POA: Diagnosis not present

## 2013-12-11 DIAGNOSIS — E876 Hypokalemia: Secondary | ICD-10-CM | POA: Diagnosis not present

## 2013-12-11 DIAGNOSIS — N476 Balanoposthitis: Secondary | ICD-10-CM | POA: Diagnosis not present

## 2013-12-12 DIAGNOSIS — Z930 Tracheostomy status: Secondary | ICD-10-CM | POA: Diagnosis not present

## 2013-12-12 DIAGNOSIS — M129 Arthropathy, unspecified: Secondary | ICD-10-CM | POA: Diagnosis present

## 2013-12-12 DIAGNOSIS — R509 Fever, unspecified: Secondary | ICD-10-CM | POA: Diagnosis not present

## 2013-12-12 DIAGNOSIS — F09 Unspecified mental disorder due to known physiological condition: Secondary | ICD-10-CM | POA: Diagnosis not present

## 2013-12-12 DIAGNOSIS — R141 Gas pain: Secondary | ICD-10-CM | POA: Diagnosis not present

## 2013-12-12 DIAGNOSIS — I619 Nontraumatic intracerebral hemorrhage, unspecified: Secondary | ICD-10-CM | POA: Diagnosis not present

## 2013-12-12 DIAGNOSIS — I872 Venous insufficiency (chronic) (peripheral): Secondary | ICD-10-CM | POA: Diagnosis present

## 2013-12-12 DIAGNOSIS — I4729 Other ventricular tachycardia: Secondary | ICD-10-CM | POA: Diagnosis not present

## 2013-12-12 DIAGNOSIS — J449 Chronic obstructive pulmonary disease, unspecified: Secondary | ICD-10-CM | POA: Diagnosis present

## 2013-12-12 DIAGNOSIS — N19 Unspecified kidney failure: Secondary | ICD-10-CM | POA: Diagnosis not present

## 2013-12-12 DIAGNOSIS — N390 Urinary tract infection, site not specified: Secondary | ICD-10-CM | POA: Diagnosis not present

## 2013-12-12 DIAGNOSIS — I629 Nontraumatic intracranial hemorrhage, unspecified: Secondary | ICD-10-CM | POA: Diagnosis not present

## 2013-12-12 DIAGNOSIS — E11319 Type 2 diabetes mellitus with unspecified diabetic retinopathy without macular edema: Secondary | ICD-10-CM | POA: Diagnosis present

## 2013-12-12 DIAGNOSIS — N179 Acute kidney failure, unspecified: Secondary | ICD-10-CM | POA: Diagnosis not present

## 2013-12-12 DIAGNOSIS — R319 Hematuria, unspecified: Secondary | ICD-10-CM | POA: Diagnosis not present

## 2013-12-12 DIAGNOSIS — G9341 Metabolic encephalopathy: Secondary | ICD-10-CM | POA: Diagnosis not present

## 2013-12-12 DIAGNOSIS — R6520 Severe sepsis without septic shock: Secondary | ICD-10-CM | POA: Diagnosis present

## 2013-12-12 DIAGNOSIS — H539 Unspecified visual disturbance: Secondary | ICD-10-CM | POA: Diagnosis not present

## 2013-12-12 DIAGNOSIS — H543 Unqualified visual loss, both eyes: Secondary | ICD-10-CM | POA: Diagnosis not present

## 2013-12-12 DIAGNOSIS — N281 Cyst of kidney, acquired: Secondary | ICD-10-CM | POA: Diagnosis present

## 2013-12-12 DIAGNOSIS — I10 Essential (primary) hypertension: Secondary | ICD-10-CM | POA: Diagnosis present

## 2013-12-12 DIAGNOSIS — M25569 Pain in unspecified knee: Secondary | ICD-10-CM | POA: Diagnosis present

## 2013-12-12 DIAGNOSIS — I501 Left ventricular failure: Secondary | ICD-10-CM | POA: Diagnosis not present

## 2013-12-12 DIAGNOSIS — B961 Klebsiella pneumoniae [K. pneumoniae] as the cause of diseases classified elsewhere: Secondary | ICD-10-CM | POA: Diagnosis present

## 2013-12-12 DIAGNOSIS — R0609 Other forms of dyspnea: Secondary | ICD-10-CM | POA: Diagnosis not present

## 2013-12-12 DIAGNOSIS — J9620 Acute and chronic respiratory failure, unspecified whether with hypoxia or hypercapnia: Secondary | ICD-10-CM | POA: Diagnosis present

## 2013-12-12 DIAGNOSIS — I2789 Other specified pulmonary heart diseases: Secondary | ICD-10-CM | POA: Diagnosis present

## 2013-12-12 DIAGNOSIS — R4182 Altered mental status, unspecified: Secondary | ICD-10-CM | POA: Diagnosis not present

## 2013-12-12 DIAGNOSIS — I472 Ventricular tachycardia: Secondary | ICD-10-CM | POA: Diagnosis not present

## 2013-12-12 DIAGNOSIS — Z6841 Body Mass Index (BMI) 40.0 and over, adult: Secondary | ICD-10-CM | POA: Diagnosis not present

## 2013-12-12 DIAGNOSIS — A419 Sepsis, unspecified organism: Secondary | ICD-10-CM | POA: Diagnosis not present

## 2013-12-12 DIAGNOSIS — I5033 Acute on chronic diastolic (congestive) heart failure: Secondary | ICD-10-CM | POA: Diagnosis not present

## 2013-12-12 DIAGNOSIS — R404 Transient alteration of awareness: Secondary | ICD-10-CM | POA: Diagnosis not present

## 2013-12-12 DIAGNOSIS — J9600 Acute respiratory failure, unspecified whether with hypoxia or hypercapnia: Secondary | ICD-10-CM | POA: Diagnosis not present

## 2013-12-12 DIAGNOSIS — I2510 Atherosclerotic heart disease of native coronary artery without angina pectoris: Secondary | ICD-10-CM | POA: Diagnosis present

## 2013-12-12 DIAGNOSIS — E1139 Type 2 diabetes mellitus with other diabetic ophthalmic complication: Secondary | ICD-10-CM | POA: Diagnosis present

## 2013-12-12 DIAGNOSIS — R7881 Bacteremia: Secondary | ICD-10-CM | POA: Diagnosis not present

## 2013-12-12 DIAGNOSIS — R142 Eructation: Secondary | ICD-10-CM | POA: Diagnosis not present

## 2013-12-12 DIAGNOSIS — I219 Acute myocardial infarction, unspecified: Secondary | ICD-10-CM | POA: Diagnosis not present

## 2013-12-12 DIAGNOSIS — R491 Aphonia: Secondary | ICD-10-CM | POA: Diagnosis not present

## 2013-12-12 DIAGNOSIS — J189 Pneumonia, unspecified organism: Secondary | ICD-10-CM | POA: Diagnosis not present

## 2013-12-12 DIAGNOSIS — R0989 Other specified symptoms and signs involving the circulatory and respiratory systems: Secondary | ICD-10-CM | POA: Diagnosis not present

## 2013-12-12 DIAGNOSIS — J90 Pleural effusion, not elsewhere classified: Secondary | ICD-10-CM | POA: Diagnosis not present

## 2013-12-12 DIAGNOSIS — I4891 Unspecified atrial fibrillation: Secondary | ICD-10-CM | POA: Diagnosis present

## 2013-12-12 DIAGNOSIS — A4159 Other Gram-negative sepsis: Secondary | ICD-10-CM | POA: Diagnosis not present

## 2013-12-12 DIAGNOSIS — G9349 Other encephalopathy: Secondary | ICD-10-CM | POA: Diagnosis present

## 2013-12-20 ENCOUNTER — Ambulatory Visit: Payer: Medicare (Managed Care) | Admitting: Pulmonary Disease

## 2013-12-26 DIAGNOSIS — Z431 Encounter for attention to gastrostomy: Secondary | ICD-10-CM | POA: Diagnosis not present

## 2013-12-26 DIAGNOSIS — Z8673 Personal history of transient ischemic attack (TIA), and cerebral infarction without residual deficits: Secondary | ICD-10-CM | POA: Diagnosis not present

## 2013-12-26 DIAGNOSIS — Z87891 Personal history of nicotine dependence: Secondary | ICD-10-CM | POA: Diagnosis not present

## 2013-12-26 DIAGNOSIS — H543 Unqualified visual loss, both eyes: Secondary | ICD-10-CM | POA: Diagnosis present

## 2013-12-26 DIAGNOSIS — Z430 Encounter for attention to tracheostomy: Secondary | ICD-10-CM | POA: Diagnosis not present

## 2013-12-26 DIAGNOSIS — H3530 Unspecified macular degeneration: Secondary | ICD-10-CM | POA: Diagnosis present

## 2013-12-26 DIAGNOSIS — K5900 Constipation, unspecified: Secondary | ICD-10-CM | POA: Diagnosis present

## 2013-12-26 DIAGNOSIS — S98919A Complete traumatic amputation of unspecified foot, level unspecified, initial encounter: Secondary | ICD-10-CM | POA: Diagnosis not present

## 2013-12-26 DIAGNOSIS — Z86711 Personal history of pulmonary embolism: Secondary | ICD-10-CM | POA: Diagnosis not present

## 2013-12-26 DIAGNOSIS — J9600 Acute respiratory failure, unspecified whether with hypoxia or hypercapnia: Secondary | ICD-10-CM | POA: Diagnosis not present

## 2013-12-26 DIAGNOSIS — I5032 Chronic diastolic (congestive) heart failure: Secondary | ICD-10-CM | POA: Diagnosis not present

## 2013-12-26 DIAGNOSIS — I10 Essential (primary) hypertension: Secondary | ICD-10-CM | POA: Diagnosis present

## 2013-12-26 DIAGNOSIS — L8993 Pressure ulcer of unspecified site, stage 3: Secondary | ICD-10-CM | POA: Diagnosis present

## 2013-12-26 DIAGNOSIS — J9610 Chronic respiratory failure, unspecified whether with hypoxia or hypercapnia: Secondary | ICD-10-CM | POA: Diagnosis not present

## 2013-12-26 DIAGNOSIS — J189 Pneumonia, unspecified organism: Secondary | ICD-10-CM | POA: Diagnosis not present

## 2013-12-26 DIAGNOSIS — M25569 Pain in unspecified knee: Secondary | ICD-10-CM | POA: Diagnosis present

## 2013-12-26 DIAGNOSIS — Z950 Presence of cardiac pacemaker: Secondary | ICD-10-CM | POA: Diagnosis not present

## 2013-12-26 DIAGNOSIS — G479 Sleep disorder, unspecified: Secondary | ICD-10-CM | POA: Diagnosis present

## 2013-12-26 DIAGNOSIS — I69920 Aphasia following unspecified cerebrovascular disease: Secondary | ICD-10-CM | POA: Diagnosis not present

## 2013-12-26 DIAGNOSIS — H409 Unspecified glaucoma: Secondary | ICD-10-CM | POA: Diagnosis present

## 2013-12-26 DIAGNOSIS — E440 Moderate protein-calorie malnutrition: Secondary | ICD-10-CM | POA: Diagnosis not present

## 2013-12-26 DIAGNOSIS — E119 Type 2 diabetes mellitus without complications: Secondary | ICD-10-CM | POA: Diagnosis present

## 2013-12-26 DIAGNOSIS — Z5189 Encounter for other specified aftercare: Secondary | ICD-10-CM | POA: Diagnosis not present

## 2013-12-26 DIAGNOSIS — I4891 Unspecified atrial fibrillation: Secondary | ICD-10-CM | POA: Diagnosis present

## 2013-12-26 DIAGNOSIS — I509 Heart failure, unspecified: Secondary | ICD-10-CM | POA: Diagnosis present

## 2013-12-26 DIAGNOSIS — I2699 Other pulmonary embolism without acute cor pulmonale: Secondary | ICD-10-CM | POA: Diagnosis not present

## 2013-12-26 DIAGNOSIS — M6281 Muscle weakness (generalized): Secondary | ICD-10-CM | POA: Diagnosis not present

## 2014-01-04 IMAGING — CR DG CHEST 2V
3 series · 3 of 3 positions shown · non-contrast
Comparison: 04/09/2009 and earlier.

CLINICAL DATA: 65-year-old male with shortness of breath.

CHEST - 2 VIEW

[w chest pa]
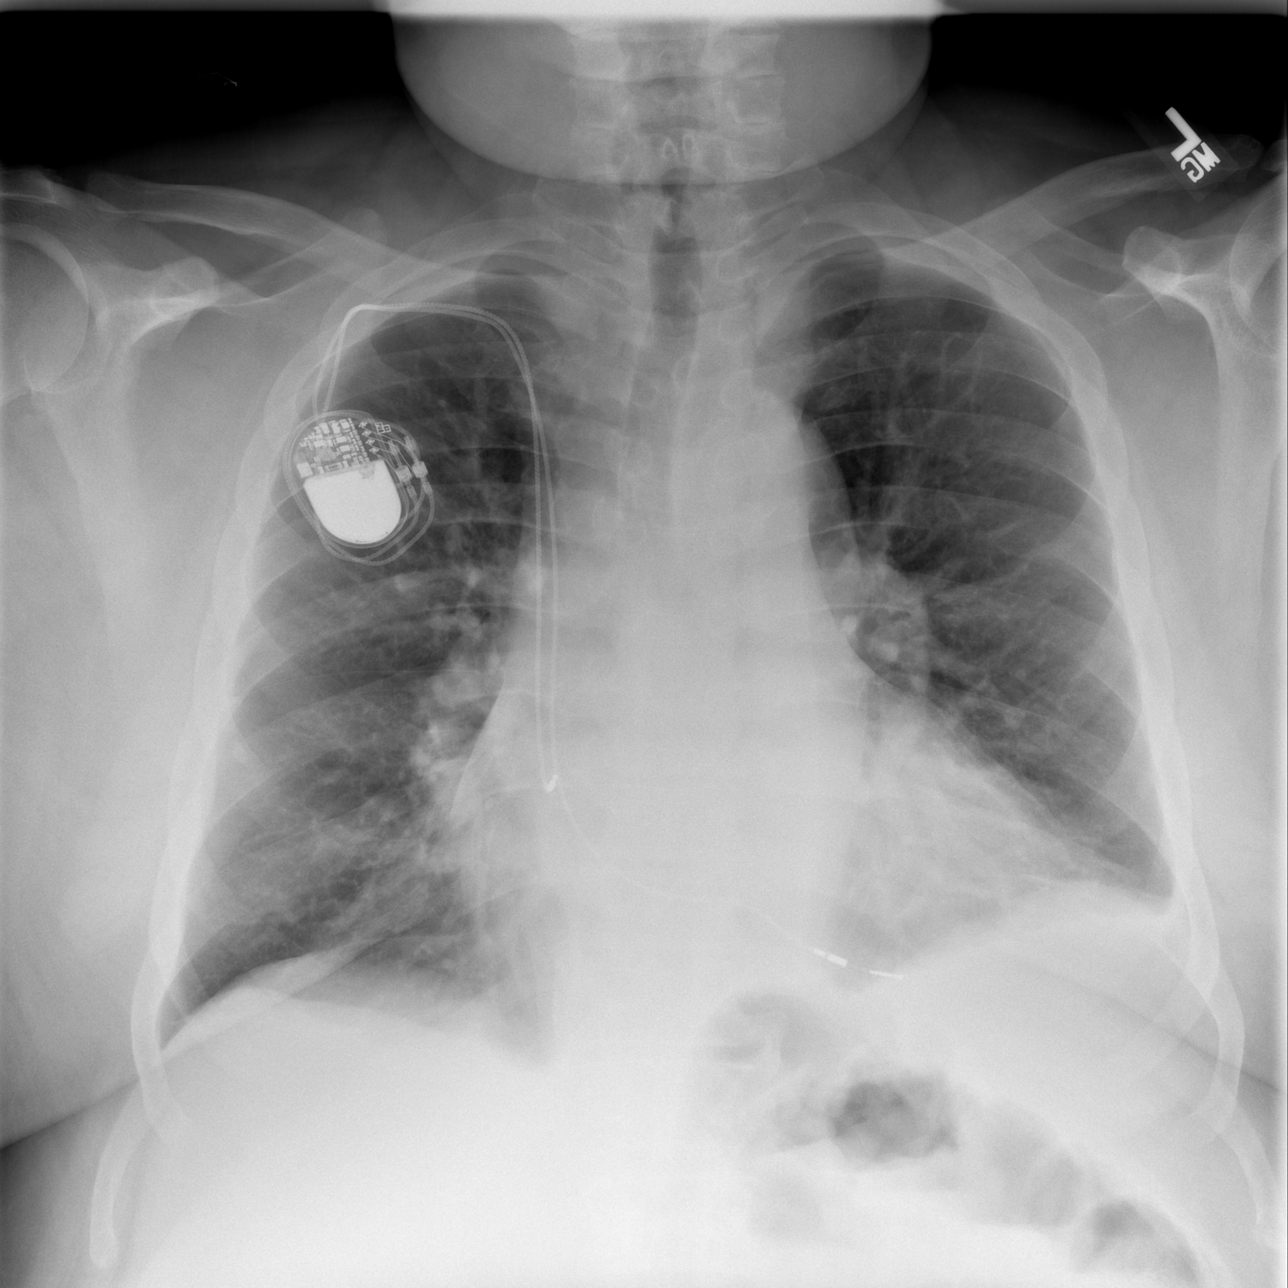

[w chest lat (1 of 2)]
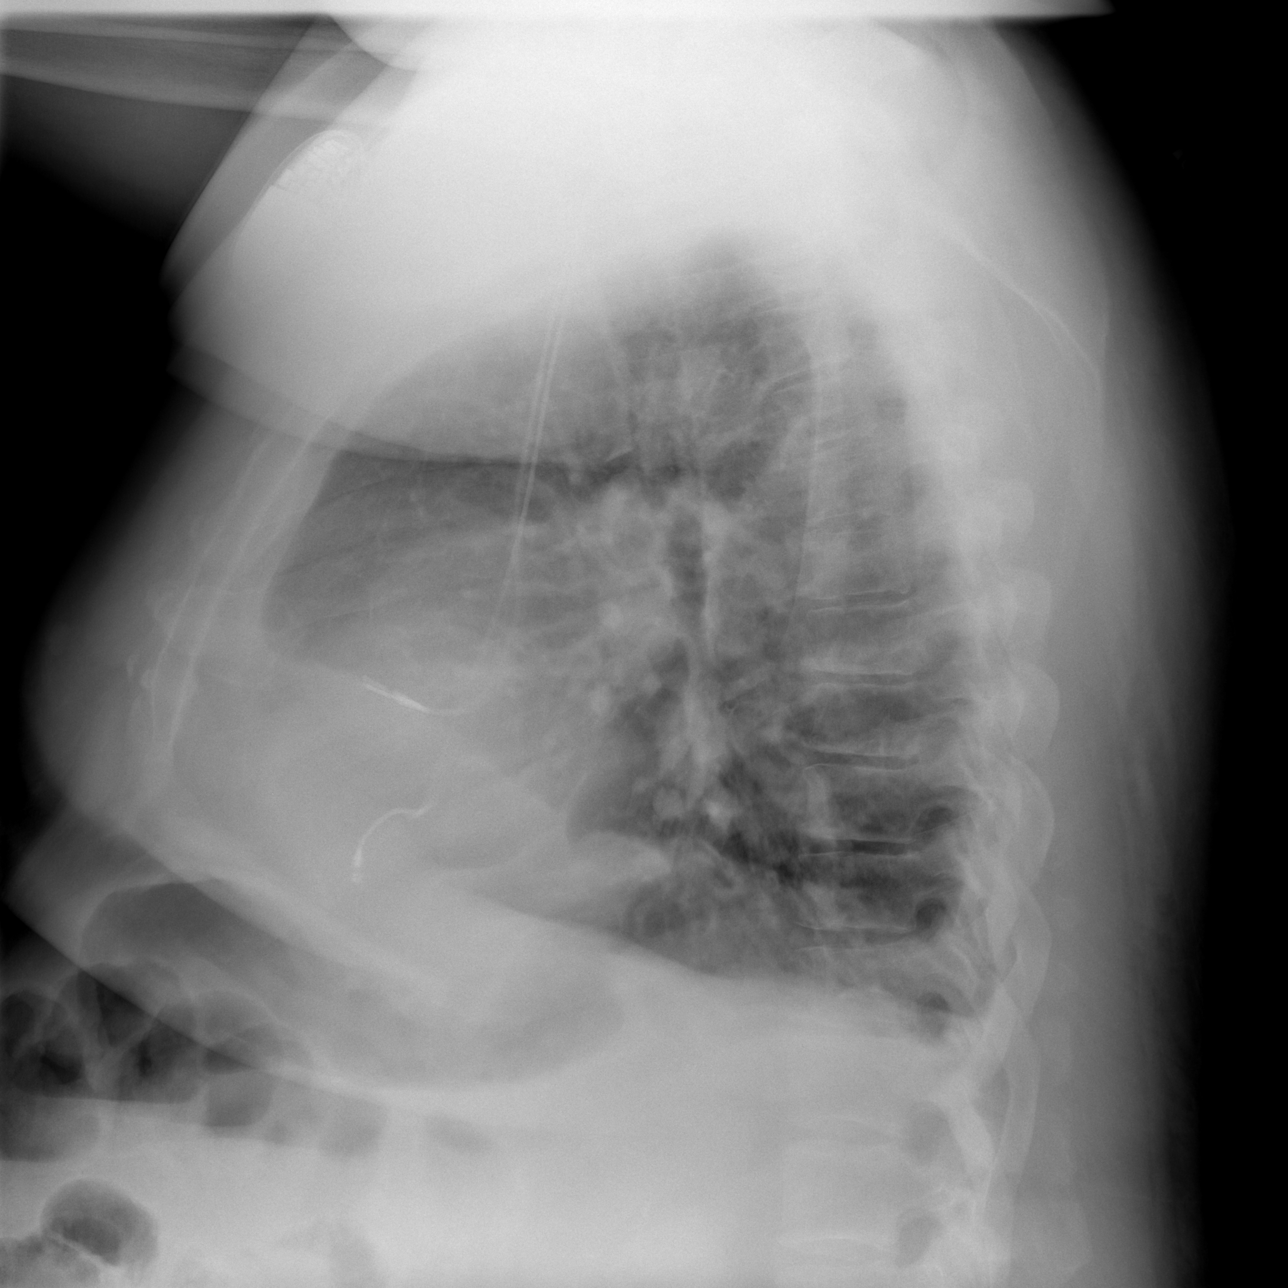

[w chest lat (2 of 2)]
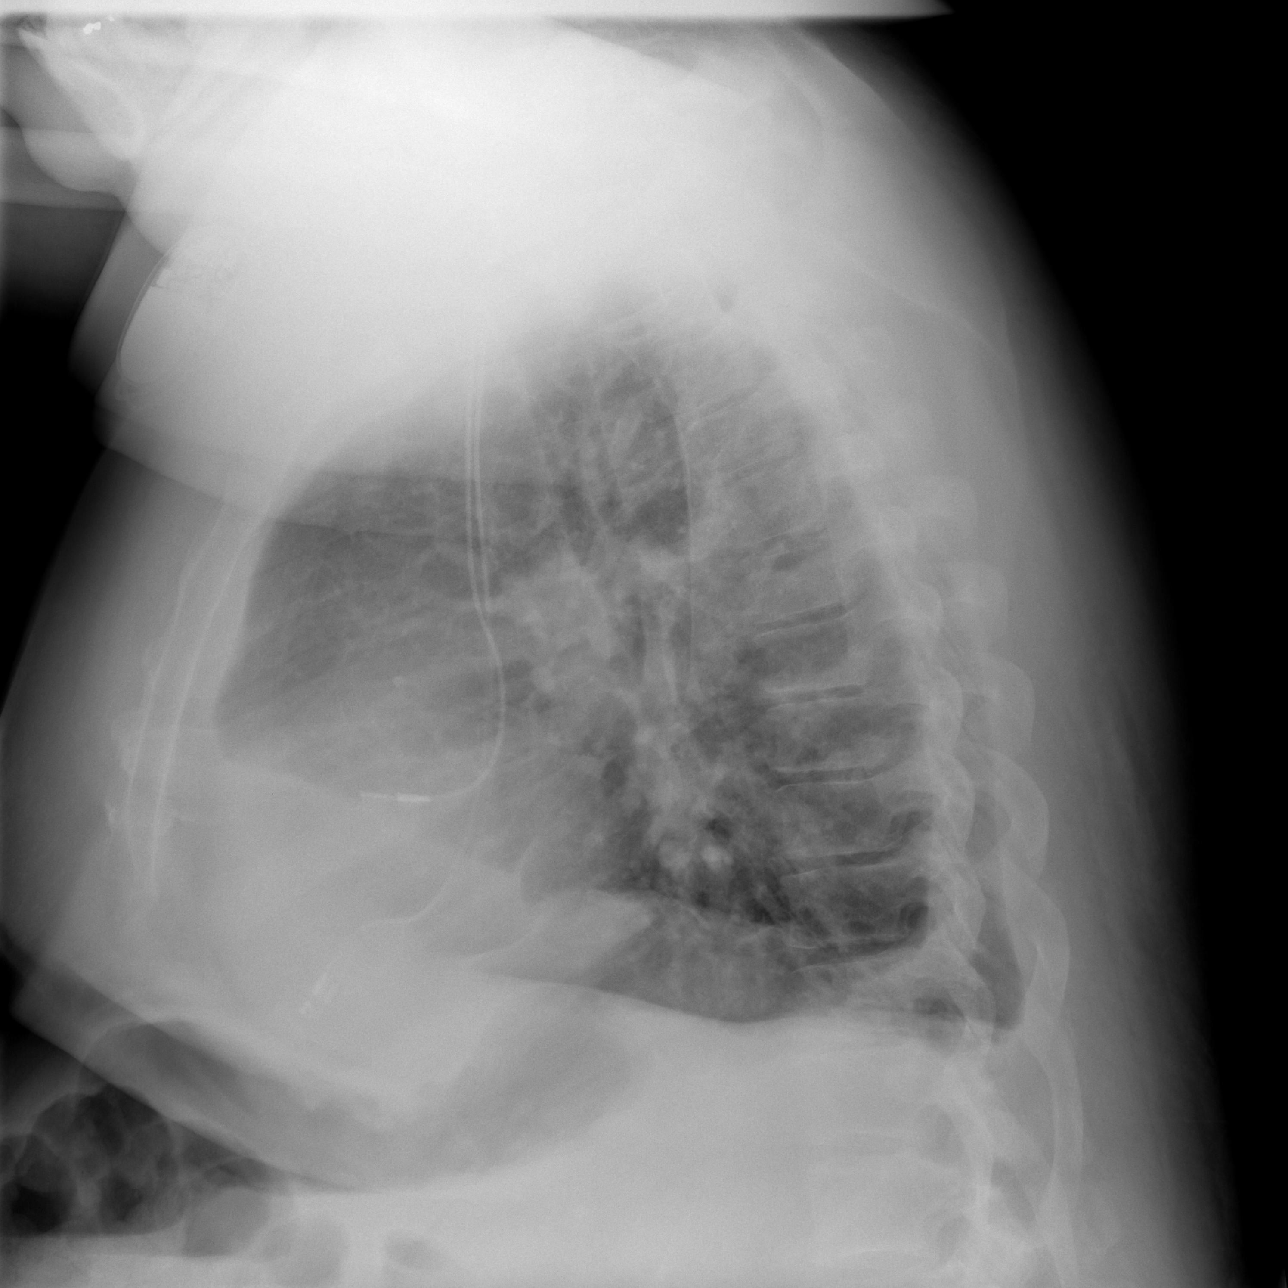

[3 of 3 positions shown; findings below may reference images not displayed]

FINDINGS: Stable right chest dual lead cardiac pacemaker. Stable
cardiomegaly and mediastinal contours.  Small bilateral pleural
effusions.  Mildly increased vascular congestion but no overt
edema.  Increased crowding of lower lung markings.  No
consolidation or other confluent pulmonary opacity. No acute
osseous abnormality identified.
IMPRESSION: Vascular congestion and small pleural effusions without overt
pulmonary edema.

## 2014-01-11 ENCOUNTER — Telehealth: Payer: Self-pay | Admitting: Cardiovascular Disease

## 2014-01-16 DIAGNOSIS — I495 Sick sinus syndrome: Secondary | ICD-10-CM | POA: Diagnosis not present

## 2014-01-16 DIAGNOSIS — Z87440 Personal history of urinary (tract) infections: Secondary | ICD-10-CM | POA: Diagnosis not present

## 2014-01-16 DIAGNOSIS — Z950 Presence of cardiac pacemaker: Secondary | ICD-10-CM | POA: Diagnosis not present

## 2014-01-16 DIAGNOSIS — I6990 Unspecified sequelae of unspecified cerebrovascular disease: Secondary | ICD-10-CM | POA: Diagnosis not present

## 2014-01-16 DIAGNOSIS — M159 Polyosteoarthritis, unspecified: Secondary | ICD-10-CM | POA: Diagnosis not present

## 2014-01-16 DIAGNOSIS — I69920 Aphasia following unspecified cerebrovascular disease: Secondary | ICD-10-CM | POA: Diagnosis not present

## 2014-01-16 DIAGNOSIS — I5032 Chronic diastolic (congestive) heart failure: Secondary | ICD-10-CM | POA: Diagnosis not present

## 2014-01-16 DIAGNOSIS — IMO0001 Reserved for inherently not codable concepts without codable children: Secondary | ICD-10-CM | POA: Diagnosis not present

## 2014-01-16 DIAGNOSIS — R609 Edema, unspecified: Secondary | ICD-10-CM | POA: Diagnosis not present

## 2014-01-16 DIAGNOSIS — N390 Urinary tract infection, site not specified: Secondary | ICD-10-CM | POA: Diagnosis not present

## 2014-01-16 DIAGNOSIS — I6350 Cerebral infarction due to unspecified occlusion or stenosis of unspecified cerebral artery: Secondary | ICD-10-CM | POA: Diagnosis not present

## 2014-01-16 DIAGNOSIS — I509 Heart failure, unspecified: Secondary | ICD-10-CM | POA: Diagnosis not present

## 2014-01-16 DIAGNOSIS — J9610 Chronic respiratory failure, unspecified whether with hypoxia or hypercapnia: Secondary | ICD-10-CM | POA: Diagnosis not present

## 2014-01-16 DIAGNOSIS — I2699 Other pulmonary embolism without acute cor pulmonale: Secondary | ICD-10-CM | POA: Diagnosis not present

## 2014-01-16 DIAGNOSIS — I10 Essential (primary) hypertension: Secondary | ICD-10-CM | POA: Diagnosis not present

## 2014-01-16 DIAGNOSIS — I4891 Unspecified atrial fibrillation: Secondary | ICD-10-CM | POA: Diagnosis not present

## 2014-01-16 DIAGNOSIS — Z5189 Encounter for other specified aftercare: Secondary | ICD-10-CM | POA: Diagnosis not present

## 2014-01-16 DIAGNOSIS — E440 Moderate protein-calorie malnutrition: Secondary | ICD-10-CM | POA: Diagnosis not present

## 2014-01-16 DIAGNOSIS — M6281 Muscle weakness (generalized): Secondary | ICD-10-CM | POA: Diagnosis not present

## 2014-01-16 DIAGNOSIS — R52 Pain, unspecified: Secondary | ICD-10-CM | POA: Diagnosis not present

## 2014-01-16 DIAGNOSIS — IMO0002 Reserved for concepts with insufficient information to code with codable children: Secondary | ICD-10-CM | POA: Diagnosis not present

## 2014-01-16 DIAGNOSIS — J9600 Acute respiratory failure, unspecified whether with hypoxia or hypercapnia: Secondary | ICD-10-CM | POA: Diagnosis not present

## 2014-01-16 DIAGNOSIS — M109 Gout, unspecified: Secondary | ICD-10-CM | POA: Diagnosis not present

## 2014-01-18 DIAGNOSIS — Z87440 Personal history of urinary (tract) infections: Secondary | ICD-10-CM | POA: Diagnosis not present

## 2014-01-18 DIAGNOSIS — I6990 Unspecified sequelae of unspecified cerebrovascular disease: Secondary | ICD-10-CM | POA: Diagnosis not present

## 2014-01-18 DIAGNOSIS — J9600 Acute respiratory failure, unspecified whether with hypoxia or hypercapnia: Secondary | ICD-10-CM | POA: Diagnosis not present

## 2014-01-21 DIAGNOSIS — I509 Heart failure, unspecified: Secondary | ICD-10-CM | POA: Diagnosis not present

## 2014-01-21 DIAGNOSIS — M159 Polyosteoarthritis, unspecified: Secondary | ICD-10-CM | POA: Diagnosis not present

## 2014-01-21 DIAGNOSIS — IMO0002 Reserved for concepts with insufficient information to code with codable children: Secondary | ICD-10-CM | POA: Diagnosis not present

## 2014-01-21 DIAGNOSIS — IMO0001 Reserved for inherently not codable concepts without codable children: Secondary | ICD-10-CM | POA: Diagnosis not present

## 2014-01-25 DIAGNOSIS — I10 Essential (primary) hypertension: Secondary | ICD-10-CM | POA: Diagnosis not present

## 2014-01-25 DIAGNOSIS — R52 Pain, unspecified: Secondary | ICD-10-CM | POA: Diagnosis not present

## 2014-01-25 DIAGNOSIS — I509 Heart failure, unspecified: Secondary | ICD-10-CM | POA: Diagnosis not present

## 2014-01-30 DIAGNOSIS — I509 Heart failure, unspecified: Secondary | ICD-10-CM | POA: Diagnosis not present

## 2014-01-30 DIAGNOSIS — R609 Edema, unspecified: Secondary | ICD-10-CM | POA: Diagnosis not present

## 2014-01-31 DIAGNOSIS — I495 Sick sinus syndrome: Secondary | ICD-10-CM | POA: Diagnosis not present

## 2014-01-31 DIAGNOSIS — I6350 Cerebral infarction due to unspecified occlusion or stenosis of unspecified cerebral artery: Secondary | ICD-10-CM | POA: Diagnosis not present

## 2014-01-31 DIAGNOSIS — I4891 Unspecified atrial fibrillation: Secondary | ICD-10-CM | POA: Diagnosis not present

## 2014-01-31 DIAGNOSIS — Z950 Presence of cardiac pacemaker: Secondary | ICD-10-CM | POA: Diagnosis not present

## 2014-02-11 DIAGNOSIS — R609 Edema, unspecified: Secondary | ICD-10-CM | POA: Diagnosis not present

## 2014-02-11 DIAGNOSIS — I509 Heart failure, unspecified: Secondary | ICD-10-CM | POA: Diagnosis not present

## 2014-02-20 DIAGNOSIS — M159 Polyosteoarthritis, unspecified: Secondary | ICD-10-CM | POA: Diagnosis not present

## 2014-02-20 DIAGNOSIS — IMO0001 Reserved for inherently not codable concepts without codable children: Secondary | ICD-10-CM | POA: Diagnosis not present

## 2014-02-20 DIAGNOSIS — I509 Heart failure, unspecified: Secondary | ICD-10-CM | POA: Diagnosis not present

## 2014-02-20 DIAGNOSIS — M109 Gout, unspecified: Secondary | ICD-10-CM | POA: Diagnosis not present

## 2014-02-26 DIAGNOSIS — I6990 Unspecified sequelae of unspecified cerebrovascular disease: Secondary | ICD-10-CM | POA: Diagnosis not present

## 2014-02-26 DIAGNOSIS — N390 Urinary tract infection, site not specified: Secondary | ICD-10-CM | POA: Diagnosis not present

## 2014-02-26 DIAGNOSIS — J9600 Acute respiratory failure, unspecified whether with hypoxia or hypercapnia: Secondary | ICD-10-CM | POA: Diagnosis not present

## 2014-02-27 DIAGNOSIS — I509 Heart failure, unspecified: Secondary | ICD-10-CM | POA: Diagnosis not present

## 2014-02-27 DIAGNOSIS — M159 Polyosteoarthritis, unspecified: Secondary | ICD-10-CM | POA: Diagnosis not present

## 2014-02-27 DIAGNOSIS — IMO0001 Reserved for inherently not codable concepts without codable children: Secondary | ICD-10-CM | POA: Diagnosis not present

## 2014-02-28 ENCOUNTER — Encounter: Payer: Medicare (Managed Care) | Admitting: Cardiovascular Disease

## 2014-03-05 DIAGNOSIS — E119 Type 2 diabetes mellitus without complications: Secondary | ICD-10-CM | POA: Diagnosis not present

## 2014-03-05 DIAGNOSIS — M25569 Pain in unspecified knee: Secondary | ICD-10-CM | POA: Diagnosis not present

## 2014-03-05 DIAGNOSIS — D649 Anemia, unspecified: Secondary | ICD-10-CM | POA: Diagnosis not present

## 2014-03-05 DIAGNOSIS — I69959 Hemiplegia and hemiparesis following unspecified cerebrovascular disease affecting unspecified side: Secondary | ICD-10-CM | POA: Diagnosis not present

## 2014-03-05 DIAGNOSIS — H4010X0 Unspecified open-angle glaucoma, stage unspecified: Secondary | ICD-10-CM | POA: Diagnosis not present

## 2014-03-05 DIAGNOSIS — I509 Heart failure, unspecified: Secondary | ICD-10-CM | POA: Diagnosis not present

## 2014-03-05 DIAGNOSIS — H543 Unqualified visual loss, both eyes: Secondary | ICD-10-CM | POA: Diagnosis not present

## 2014-03-05 DIAGNOSIS — H548 Legal blindness, as defined in USA: Secondary | ICD-10-CM | POA: Diagnosis not present

## 2014-03-05 DIAGNOSIS — E785 Hyperlipidemia, unspecified: Secondary | ICD-10-CM | POA: Diagnosis not present

## 2014-03-05 DIAGNOSIS — M6281 Muscle weakness (generalized): Secondary | ICD-10-CM | POA: Diagnosis not present

## 2014-03-05 DIAGNOSIS — I10 Essential (primary) hypertension: Secondary | ICD-10-CM | POA: Diagnosis not present

## 2014-03-05 DIAGNOSIS — E440 Moderate protein-calorie malnutrition: Secondary | ICD-10-CM | POA: Diagnosis not present

## 2014-03-05 DIAGNOSIS — IMO0001 Reserved for inherently not codable concepts without codable children: Secondary | ICD-10-CM | POA: Diagnosis not present

## 2014-03-05 DIAGNOSIS — I69920 Aphasia following unspecified cerebrovascular disease: Secondary | ICD-10-CM | POA: Diagnosis not present

## 2014-03-05 DIAGNOSIS — I5032 Chronic diastolic (congestive) heart failure: Secondary | ICD-10-CM | POA: Diagnosis not present

## 2014-03-05 DIAGNOSIS — Z79899 Other long term (current) drug therapy: Secondary | ICD-10-CM | POA: Diagnosis not present

## 2014-03-07 DIAGNOSIS — E440 Moderate protein-calorie malnutrition: Secondary | ICD-10-CM | POA: Diagnosis not present

## 2014-03-07 DIAGNOSIS — IMO0001 Reserved for inherently not codable concepts without codable children: Secondary | ICD-10-CM | POA: Diagnosis not present

## 2014-03-07 DIAGNOSIS — I69920 Aphasia following unspecified cerebrovascular disease: Secondary | ICD-10-CM | POA: Diagnosis not present

## 2014-03-07 DIAGNOSIS — M6281 Muscle weakness (generalized): Secondary | ICD-10-CM | POA: Diagnosis not present

## 2014-03-07 DIAGNOSIS — H548 Legal blindness, as defined in USA: Secondary | ICD-10-CM | POA: Diagnosis not present

## 2014-03-07 DIAGNOSIS — I5032 Chronic diastolic (congestive) heart failure: Secondary | ICD-10-CM | POA: Diagnosis not present

## 2014-03-08 DIAGNOSIS — E440 Moderate protein-calorie malnutrition: Secondary | ICD-10-CM | POA: Diagnosis not present

## 2014-03-08 DIAGNOSIS — M6281 Muscle weakness (generalized): Secondary | ICD-10-CM | POA: Diagnosis not present

## 2014-03-08 DIAGNOSIS — H548 Legal blindness, as defined in USA: Secondary | ICD-10-CM | POA: Diagnosis not present

## 2014-03-08 DIAGNOSIS — I5032 Chronic diastolic (congestive) heart failure: Secondary | ICD-10-CM | POA: Diagnosis not present

## 2014-03-08 DIAGNOSIS — I69920 Aphasia following unspecified cerebrovascular disease: Secondary | ICD-10-CM | POA: Diagnosis not present

## 2014-03-08 DIAGNOSIS — IMO0001 Reserved for inherently not codable concepts without codable children: Secondary | ICD-10-CM | POA: Diagnosis not present

## 2014-03-11 DIAGNOSIS — H548 Legal blindness, as defined in USA: Secondary | ICD-10-CM | POA: Diagnosis not present

## 2014-03-11 DIAGNOSIS — I5032 Chronic diastolic (congestive) heart failure: Secondary | ICD-10-CM | POA: Diagnosis not present

## 2014-03-11 DIAGNOSIS — E440 Moderate protein-calorie malnutrition: Secondary | ICD-10-CM | POA: Diagnosis not present

## 2014-03-11 DIAGNOSIS — IMO0001 Reserved for inherently not codable concepts without codable children: Secondary | ICD-10-CM | POA: Diagnosis not present

## 2014-03-11 DIAGNOSIS — I69920 Aphasia following unspecified cerebrovascular disease: Secondary | ICD-10-CM | POA: Diagnosis not present

## 2014-03-11 DIAGNOSIS — M6281 Muscle weakness (generalized): Secondary | ICD-10-CM | POA: Diagnosis not present

## 2014-03-12 DIAGNOSIS — IMO0001 Reserved for inherently not codable concepts without codable children: Secondary | ICD-10-CM | POA: Diagnosis not present

## 2014-03-12 DIAGNOSIS — H548 Legal blindness, as defined in USA: Secondary | ICD-10-CM | POA: Diagnosis not present

## 2014-03-12 DIAGNOSIS — I5032 Chronic diastolic (congestive) heart failure: Secondary | ICD-10-CM | POA: Diagnosis not present

## 2014-03-12 DIAGNOSIS — M6281 Muscle weakness (generalized): Secondary | ICD-10-CM | POA: Diagnosis not present

## 2014-03-12 DIAGNOSIS — I69920 Aphasia following unspecified cerebrovascular disease: Secondary | ICD-10-CM | POA: Diagnosis not present

## 2014-03-12 DIAGNOSIS — E440 Moderate protein-calorie malnutrition: Secondary | ICD-10-CM | POA: Diagnosis not present

## 2014-03-14 DIAGNOSIS — IMO0001 Reserved for inherently not codable concepts without codable children: Secondary | ICD-10-CM | POA: Diagnosis not present

## 2014-03-14 DIAGNOSIS — M6281 Muscle weakness (generalized): Secondary | ICD-10-CM | POA: Diagnosis not present

## 2014-03-14 DIAGNOSIS — I5032 Chronic diastolic (congestive) heart failure: Secondary | ICD-10-CM | POA: Diagnosis not present

## 2014-03-14 DIAGNOSIS — I69920 Aphasia following unspecified cerebrovascular disease: Secondary | ICD-10-CM | POA: Diagnosis not present

## 2014-03-14 DIAGNOSIS — H548 Legal blindness, as defined in USA: Secondary | ICD-10-CM | POA: Diagnosis not present

## 2014-03-14 DIAGNOSIS — E440 Moderate protein-calorie malnutrition: Secondary | ICD-10-CM | POA: Diagnosis not present

## 2014-03-15 DIAGNOSIS — M6281 Muscle weakness (generalized): Secondary | ICD-10-CM | POA: Diagnosis not present

## 2014-03-15 DIAGNOSIS — E440 Moderate protein-calorie malnutrition: Secondary | ICD-10-CM | POA: Diagnosis not present

## 2014-03-15 DIAGNOSIS — I69920 Aphasia following unspecified cerebrovascular disease: Secondary | ICD-10-CM | POA: Diagnosis not present

## 2014-03-15 DIAGNOSIS — H548 Legal blindness, as defined in USA: Secondary | ICD-10-CM | POA: Diagnosis not present

## 2014-03-15 DIAGNOSIS — I5032 Chronic diastolic (congestive) heart failure: Secondary | ICD-10-CM | POA: Diagnosis not present

## 2014-03-15 DIAGNOSIS — IMO0001 Reserved for inherently not codable concepts without codable children: Secondary | ICD-10-CM | POA: Diagnosis not present

## 2014-03-18 DIAGNOSIS — I5032 Chronic diastolic (congestive) heart failure: Secondary | ICD-10-CM | POA: Diagnosis not present

## 2014-03-18 DIAGNOSIS — IMO0001 Reserved for inherently not codable concepts without codable children: Secondary | ICD-10-CM | POA: Diagnosis not present

## 2014-03-18 DIAGNOSIS — H548 Legal blindness, as defined in USA: Secondary | ICD-10-CM | POA: Diagnosis not present

## 2014-03-18 DIAGNOSIS — I69920 Aphasia following unspecified cerebrovascular disease: Secondary | ICD-10-CM | POA: Diagnosis not present

## 2014-03-18 DIAGNOSIS — E440 Moderate protein-calorie malnutrition: Secondary | ICD-10-CM | POA: Diagnosis not present

## 2014-03-18 DIAGNOSIS — M6281 Muscle weakness (generalized): Secondary | ICD-10-CM | POA: Diagnosis not present

## 2014-03-19 DIAGNOSIS — E440 Moderate protein-calorie malnutrition: Secondary | ICD-10-CM | POA: Diagnosis not present

## 2014-03-19 DIAGNOSIS — I5032 Chronic diastolic (congestive) heart failure: Secondary | ICD-10-CM | POA: Diagnosis not present

## 2014-03-19 DIAGNOSIS — H548 Legal blindness, as defined in USA: Secondary | ICD-10-CM | POA: Diagnosis not present

## 2014-03-19 DIAGNOSIS — I69920 Aphasia following unspecified cerebrovascular disease: Secondary | ICD-10-CM | POA: Diagnosis not present

## 2014-03-19 DIAGNOSIS — M6281 Muscle weakness (generalized): Secondary | ICD-10-CM | POA: Diagnosis not present

## 2014-03-19 DIAGNOSIS — IMO0001 Reserved for inherently not codable concepts without codable children: Secondary | ICD-10-CM | POA: Diagnosis not present

## 2014-03-20 DIAGNOSIS — E440 Moderate protein-calorie malnutrition: Secondary | ICD-10-CM | POA: Diagnosis not present

## 2014-03-20 DIAGNOSIS — M6281 Muscle weakness (generalized): Secondary | ICD-10-CM | POA: Diagnosis not present

## 2014-03-20 DIAGNOSIS — I5032 Chronic diastolic (congestive) heart failure: Secondary | ICD-10-CM | POA: Diagnosis not present

## 2014-03-20 DIAGNOSIS — I69920 Aphasia following unspecified cerebrovascular disease: Secondary | ICD-10-CM | POA: Diagnosis not present

## 2014-03-20 DIAGNOSIS — H548 Legal blindness, as defined in USA: Secondary | ICD-10-CM | POA: Diagnosis not present

## 2014-03-20 DIAGNOSIS — IMO0001 Reserved for inherently not codable concepts without codable children: Secondary | ICD-10-CM | POA: Diagnosis not present

## 2014-03-21 DIAGNOSIS — I10 Essential (primary) hypertension: Secondary | ICD-10-CM | POA: Diagnosis not present

## 2014-03-21 DIAGNOSIS — E785 Hyperlipidemia, unspecified: Secondary | ICD-10-CM | POA: Diagnosis not present

## 2014-03-21 DIAGNOSIS — I509 Heart failure, unspecified: Secondary | ICD-10-CM | POA: Diagnosis not present

## 2014-03-21 DIAGNOSIS — Z79899 Other long term (current) drug therapy: Secondary | ICD-10-CM | POA: Diagnosis not present

## 2014-03-21 DIAGNOSIS — IMO0001 Reserved for inherently not codable concepts without codable children: Secondary | ICD-10-CM | POA: Diagnosis not present

## 2014-03-21 DIAGNOSIS — I69920 Aphasia following unspecified cerebrovascular disease: Secondary | ICD-10-CM | POA: Diagnosis not present

## 2014-03-21 DIAGNOSIS — I69959 Hemiplegia and hemiparesis following unspecified cerebrovascular disease affecting unspecified side: Secondary | ICD-10-CM | POA: Diagnosis not present

## 2014-03-21 DIAGNOSIS — E440 Moderate protein-calorie malnutrition: Secondary | ICD-10-CM | POA: Diagnosis not present

## 2014-03-21 DIAGNOSIS — H548 Legal blindness, as defined in USA: Secondary | ICD-10-CM | POA: Diagnosis not present

## 2014-03-21 DIAGNOSIS — I5032 Chronic diastolic (congestive) heart failure: Secondary | ICD-10-CM | POA: Diagnosis not present

## 2014-03-21 DIAGNOSIS — I739 Peripheral vascular disease, unspecified: Secondary | ICD-10-CM | POA: Diagnosis not present

## 2014-03-21 DIAGNOSIS — E1159 Type 2 diabetes mellitus with other circulatory complications: Secondary | ICD-10-CM | POA: Diagnosis not present

## 2014-03-21 DIAGNOSIS — M6281 Muscle weakness (generalized): Secondary | ICD-10-CM | POA: Diagnosis not present

## 2014-03-22 DIAGNOSIS — M6281 Muscle weakness (generalized): Secondary | ICD-10-CM | POA: Diagnosis not present

## 2014-03-22 DIAGNOSIS — IMO0001 Reserved for inherently not codable concepts without codable children: Secondary | ICD-10-CM | POA: Diagnosis not present

## 2014-03-22 DIAGNOSIS — I69920 Aphasia following unspecified cerebrovascular disease: Secondary | ICD-10-CM | POA: Diagnosis not present

## 2014-03-22 DIAGNOSIS — E440 Moderate protein-calorie malnutrition: Secondary | ICD-10-CM | POA: Diagnosis not present

## 2014-03-22 DIAGNOSIS — I5032 Chronic diastolic (congestive) heart failure: Secondary | ICD-10-CM | POA: Diagnosis not present

## 2014-03-22 DIAGNOSIS — H548 Legal blindness, as defined in USA: Secondary | ICD-10-CM | POA: Diagnosis not present

## 2014-03-25 DIAGNOSIS — E440 Moderate protein-calorie malnutrition: Secondary | ICD-10-CM | POA: Diagnosis not present

## 2014-03-25 DIAGNOSIS — I69920 Aphasia following unspecified cerebrovascular disease: Secondary | ICD-10-CM | POA: Diagnosis not present

## 2014-03-25 DIAGNOSIS — M6281 Muscle weakness (generalized): Secondary | ICD-10-CM | POA: Diagnosis not present

## 2014-03-25 DIAGNOSIS — IMO0001 Reserved for inherently not codable concepts without codable children: Secondary | ICD-10-CM | POA: Diagnosis not present

## 2014-03-25 DIAGNOSIS — I5032 Chronic diastolic (congestive) heart failure: Secondary | ICD-10-CM | POA: Diagnosis not present

## 2014-03-25 DIAGNOSIS — H548 Legal blindness, as defined in USA: Secondary | ICD-10-CM | POA: Diagnosis not present

## 2014-03-26 DIAGNOSIS — M6281 Muscle weakness (generalized): Secondary | ICD-10-CM | POA: Diagnosis not present

## 2014-03-26 DIAGNOSIS — I5032 Chronic diastolic (congestive) heart failure: Secondary | ICD-10-CM | POA: Diagnosis not present

## 2014-03-26 DIAGNOSIS — IMO0001 Reserved for inherently not codable concepts without codable children: Secondary | ICD-10-CM | POA: Diagnosis not present

## 2014-03-26 DIAGNOSIS — H548 Legal blindness, as defined in USA: Secondary | ICD-10-CM | POA: Diagnosis not present

## 2014-03-26 DIAGNOSIS — E440 Moderate protein-calorie malnutrition: Secondary | ICD-10-CM | POA: Diagnosis not present

## 2014-03-26 DIAGNOSIS — I69920 Aphasia following unspecified cerebrovascular disease: Secondary | ICD-10-CM | POA: Diagnosis not present

## 2014-03-28 DIAGNOSIS — M6281 Muscle weakness (generalized): Secondary | ICD-10-CM | POA: Diagnosis not present

## 2014-03-28 DIAGNOSIS — I69920 Aphasia following unspecified cerebrovascular disease: Secondary | ICD-10-CM | POA: Diagnosis not present

## 2014-03-28 DIAGNOSIS — E440 Moderate protein-calorie malnutrition: Secondary | ICD-10-CM | POA: Diagnosis not present

## 2014-03-28 DIAGNOSIS — H548 Legal blindness, as defined in USA: Secondary | ICD-10-CM | POA: Diagnosis not present

## 2014-03-28 DIAGNOSIS — IMO0001 Reserved for inherently not codable concepts without codable children: Secondary | ICD-10-CM | POA: Diagnosis not present

## 2014-03-28 DIAGNOSIS — I5032 Chronic diastolic (congestive) heart failure: Secondary | ICD-10-CM | POA: Diagnosis not present

## 2014-04-02 DIAGNOSIS — M6281 Muscle weakness (generalized): Secondary | ICD-10-CM | POA: Diagnosis not present

## 2014-04-02 DIAGNOSIS — H548 Legal blindness, as defined in USA: Secondary | ICD-10-CM | POA: Diagnosis not present

## 2014-04-02 DIAGNOSIS — IMO0001 Reserved for inherently not codable concepts without codable children: Secondary | ICD-10-CM | POA: Diagnosis not present

## 2014-04-02 DIAGNOSIS — I5032 Chronic diastolic (congestive) heart failure: Secondary | ICD-10-CM | POA: Diagnosis not present

## 2014-04-02 DIAGNOSIS — I69920 Aphasia following unspecified cerebrovascular disease: Secondary | ICD-10-CM | POA: Diagnosis not present

## 2014-04-02 DIAGNOSIS — E440 Moderate protein-calorie malnutrition: Secondary | ICD-10-CM | POA: Diagnosis not present

## 2014-04-08 ENCOUNTER — Ambulatory Visit (INDEPENDENT_AMBULATORY_CARE_PROVIDER_SITE_OTHER): Payer: Medicare Other | Admitting: Cardiology

## 2014-04-08 VITALS — BP 140/80 | HR 77 | Ht 72.0 in | Wt 301.1 lb

## 2014-04-08 DIAGNOSIS — I5032 Chronic diastolic (congestive) heart failure: Secondary | ICD-10-CM | POA: Diagnosis not present

## 2014-04-08 DIAGNOSIS — H548 Legal blindness, as defined in USA: Secondary | ICD-10-CM | POA: Diagnosis not present

## 2014-04-08 DIAGNOSIS — IMO0001 Reserved for inherently not codable concepts without codable children: Secondary | ICD-10-CM | POA: Diagnosis not present

## 2014-04-08 DIAGNOSIS — I2510 Atherosclerotic heart disease of native coronary artery without angina pectoris: Secondary | ICD-10-CM

## 2014-04-08 DIAGNOSIS — I4891 Unspecified atrial fibrillation: Secondary | ICD-10-CM

## 2014-04-08 DIAGNOSIS — I69920 Aphasia following unspecified cerebrovascular disease: Secondary | ICD-10-CM | POA: Diagnosis not present

## 2014-04-08 DIAGNOSIS — E440 Moderate protein-calorie malnutrition: Secondary | ICD-10-CM | POA: Diagnosis not present

## 2014-04-08 DIAGNOSIS — M6281 Muscle weakness (generalized): Secondary | ICD-10-CM | POA: Diagnosis not present

## 2014-04-08 NOTE — Patient Instructions (Addendum)
Your physician has recommended you make the following change in your medication: continue all medications as you have been taking them. Keep your follow up appointment that you have scheduled with Dr. Sallyanne Kuster September 29th.

## 2014-04-08 NOTE — Progress Notes (Signed)
04/08/2014 [name]   16-Apr-1946  DP:[id number]  Primary Physicia Sherian Maroon, MD Primary Cardiologist: Dr. Sallyanne Kuster  HPI:  The patient is a 68 y/o [name] with a complicated PMH, followed by Dr. Sallyanne Kuster, who presents to clinic today for post-hospital f/u after multiple extensive hospitalizations, dating back to February of this year. He was initially treated at [facility], transferred to a SNF in [place] then transferred to [facility] and later transferred back to a SNF in [place]. He was finally released in July and is now back at home in [place].  His history is notable for morbid obesity, diabetes mellitus, hypertension, obstructive sleep apnea on CPAP, mild valvular aortic stenosis, mild coronary atherosclerosis treated medically, legal blindness, severe diabetic peripheral neuropathy complicated by poorly healing wounds, chronic venous insufficiency and a dual-chamber permanent pacemaker that was implanted in 2005 for sinus node dysfunction. He also has had PAF but has not been anticoagulated because of rectal bleeding and concerns about compliance.  From what I can gather from his records, he was first admitted to [facility] on 10/01/13 with mental status changes and expressive aphasia and was diagnosed with left temporal/parietal parenchymal hematoma. He developed progressively decreased level of consciousness requiring intubation. He had an echo demonstrating a right atrial mobile mass raising suspicion of thrombus vs vegetation. Empiric antibiotics were initiated.  A TEE confirmed a large mass consistent with a clot on the pacemaker lead. No evidence of SBE as blood cultures were negative. He was also noted to have a RLL PE, felt to be subsequent to the mass on his RV pacer wire. Due to [place], he was not a candidate for full anticoagulation. However, neuro approved treatment with lovenox. After intubated he received a trach/PEG and was transfered to [facility]. He later developed  severe sepsis 2/2 UTI and PNA and developed hypoxic respiratory failure. He was transferred to [facility]. He had a long admission in the ICU. While at [facility], is pacemaker was apparently interrogated. Per records in [place], "He is s/p a dual chamber medtronic pacer. This was interrogated by the rep and confirmed that it was functioning well (only with some increased impedances which appear to be a chronic issue for him). Per the interrogation, he has not had any appreciable runs of a fib over the past 6 months. He was continued on his home amiodarone". He was eventually discharge from [facility] back to SNF to complete rehab.   He presents back to clinic for f/u. He states that he has been doing fairly well. He has a difficulty social situation. As mentioned above, he is legally blind and his daughter resides in [place]. He was late getting to his appointment today because he had to use the city bus for transportation. He is accompanied by a friend, but he too has physical limitations/ developmental issues. Luckily he is aided by a home health RN who reports daily to assist with medication/ basic health needs. He denies any chest pain, dyspnea, palpitations, fever, chills, n/v.    Current Outpatient Prescriptions  Medication Sig Dispense Refill  . acetaminophen (TYLENOL) 325 MG tablet Take 2 tablets (650 mg total) by mouth every 6 (six) hours as needed for fever (>99.5).      Marland Kitchen Amino Acids-Protein Hydrolys (FEEDING SUPPLEMENT, PRO-STAT SUGAR FREE 64,) LIQD Place 30 mLs into feeding tube 2 (two) times daily.  900 mL  0  . amiodarone (PACERONE) 200 MG tablet Take 200 mg by mouth daily.      Marland Kitchen  antiseptic oral rinse (BIOTENE) LIQD 15 mLs by Mouth Rinse route QID.      Marland Kitchen aspirin 81 MG chewable tablet Place 1 tablet (81 mg total) into feeding tube daily.      Marland Kitchen atorvastatin (LIPITOR) 10 MG tablet Place 1 tablet (10 mg total) into feeding tube daily at 6 PM.      . bisacodyl (DULCOLAX) 10 MG suppository  Place 1 suppository (10 mg total) rectally daily as needed for moderate constipation.  12 suppository  0  . brimonidine (ALPHAGAN) 0.2 % ophthalmic solution Place 1 drop into both eyes 3 (three) times daily.  5 mL  12  . carvedilol (COREG) 25 MG tablet Take 25 mg by mouth 2 (two) times daily with a meal.       . chlorhexidine (PERIDEX) 0.12 % solution 15 mLs by Mouth Rinse route 2 (two) times daily.  120 mL  0  . cloNIDine (CATAPRES) 0.2 MG tablet Take 0.5 tablets (0.1 mg total) by mouth 2 (two) times daily.  60 tablet  1  . dorzolamide-timolol (COSOPT) 22.3-6.8 MG/ML ophthalmic solution Place 1 drop into both eyes 2 (two) times daily.      Marland Kitchen enoxaparin (LOVENOX) 40 MG/0.4ML injection Inject 0.4 mLs (40 mg total) into the skin daily.  0 Syringe    . fentaNYL (SUBLIMAZE) 0.05 MG/ML injection Inject 1-2 mLs (50-100 mcg total) into the vein every 2 (two) hours as needed for severe pain (to maintain RASS goal.).  2 mL  0  . hydrALAZINE (APRESOLINE) 20 MG/ML injection Inject 0.5 mLs (10 mg total) into the vein every 2 (two) hours as needed (keep systolic less than or equal to 170 mmHg).  1 mL    . insulin aspart (NOVOLOG) 100 UNIT/ML injection Inject 0-20 Units into the skin every 4 (four) hours.  10 mL  11  . isosorbide dinitrate (ISORDIL) 10 MG tablet Place 1 tablet (10 mg total) into feeding tube 2 (two) times daily.      Marland Kitchen labetalol (NORMODYNE,TRANDATE) 5 MG/ML injection Inject 2-3 mLs (10-15 mg total) into the vein every 2 (two) hours as needed.  20 mL    . magnesium oxide (MAG-OX) 400 MG tablet Take 400 mg by mouth daily.      . methylcellulose (ARTIFICIAL TEARS) 1 % ophthalmic solution Place 1 drop into both eyes 2 (two) times daily.      . Nutritional Supplements (FEEDING SUPPLEMENT, VITAL HIGH PROTEIN,) LIQD liquid Place 1,000 mLs into feeding tube continuous.      . ondansetron (ZOFRAN) 4 MG/2ML SOLN injection Inject 2 mLs (4 mg total) into the vein every 6 (six) hours as needed for nausea or  vomiting.  2 mL  0  . pantoprazole sodium (PROTONIX) 40 mg/20 mL PACK Place 20 mLs (40 mg total) into feeding tube at bedtime.  30 each    . pneumococcal 23 valent vaccine (PNU-IMMUNE) 25 MCG/0.5ML injection Inject 0.5 mLs into the muscle tomorrow at 10 am.  2.5 mL    . Water For Irrigation, Sterile (FREE WATER) SOLN Place 200 mLs into feeding tube every 6 (six) hours.       No current facility-administered medications for this visit.    No Known Allergies  History   Social History  . Marital Status: Single    Spouse Name: N/A    Number of Children: Y  . Years of Education: N/A   Occupational History  . disabled    Social History Main Topics  . Smoking  status: Former Smoker -- 0.00 packs/day for 0 years    Types: Cigarettes    Quit date: 08/17/2003  . Smokeless tobacco: Never Used     Comment: started at age 68.  only smoked on occ- very rarely.0  . Alcohol Use: Yes     Comment: 06/23/2013 "drank some; never had a problem w/it"  . Drug Use: No  . Sexual Activity: No   Other Topics Concern  . Not on file   Social History Narrative  . No narrative on file     Review of Systems: General: negative for chills, fever, night sweats or weight changes.  Cardiovascular: negative for chest pain, dyspnea on exertion, edema, orthopnea, palpitations, paroxysmal nocturnal dyspnea or shortness of breath Dermatological: negative for rash Respiratory: negative for cough or wheezing Urologic: negative for hematuria Abdominal: negative for nausea, vomiting, diarrhea, bright red blood per rectum, melena, or hematemesis Neurologic: negative for visual changes, syncope, or dizziness All other systems reviewed and are otherwise negative except as noted above.    Blood pressure 140/80, pulse 77, height 6' (1.829 m), weight 301 lb 1.6 oz (136.578 kg).  General appearance: alert, cooperative, morbidly obese and slowed mentation, wheel chair bound Neck: no carotid bruit and no JVD Lungs:  clear to auscultation bilaterally Heart: regular rate and rhythm Abdomen: obese, soft, non tender, no masses Extremities: no LEE Pulses: 2+ and symmetric Skin: warm and dry Neurologic: Grossly normal  EKG ?Afib. Controlled rate 77 bpm. No ischemic abnormalities.  ASSESSMENT AND PLAN:   Difficult situation and complicated medical history in the past year as outlined above. Considering his circumstances, he appears to be doing fairly well w/o any recent chest pain, dyspnea, palpitations or change in mental status. HR and BP are both stable. Per records from [facility], his pacemaker was interrogated and showed no captured afib in the last 6 months at that time. However, his EKG today is a bit difficult to interpret and Afib cannot be fully ruled out. It dose appear slightly irregular and p waves are not easily seen. However, due to prior history of rectal bleeding and recent ICH, he is not a candidate for anticoagulation. His HR is also well controlled and he denies any symptoms. At this point, I recommend continuation of his current medical regimen. He has home health services including a home health RN arranged. He already has a f/u appointment with Dr. Sallyanne Kuster at the end of September. He was instructed to keep this appointment.     Aspen Lawrance, BRITTAINYPA-C 04/08/2014 6:01 PM

## 2014-04-09 DIAGNOSIS — M6281 Muscle weakness (generalized): Secondary | ICD-10-CM | POA: Diagnosis not present

## 2014-04-09 DIAGNOSIS — IMO0001 Reserved for inherently not codable concepts without codable children: Secondary | ICD-10-CM | POA: Diagnosis not present

## 2014-04-09 DIAGNOSIS — I5032 Chronic diastolic (congestive) heart failure: Secondary | ICD-10-CM | POA: Diagnosis not present

## 2014-04-09 DIAGNOSIS — H548 Legal blindness, as defined in USA: Secondary | ICD-10-CM | POA: Diagnosis not present

## 2014-04-09 DIAGNOSIS — I69920 Aphasia following unspecified cerebrovascular disease: Secondary | ICD-10-CM | POA: Diagnosis not present

## 2014-04-09 DIAGNOSIS — E440 Moderate protein-calorie malnutrition: Secondary | ICD-10-CM | POA: Diagnosis not present

## 2014-04-10 DIAGNOSIS — E440 Moderate protein-calorie malnutrition: Secondary | ICD-10-CM | POA: Diagnosis not present

## 2014-04-10 DIAGNOSIS — IMO0001 Reserved for inherently not codable concepts without codable children: Secondary | ICD-10-CM | POA: Diagnosis not present

## 2014-04-10 DIAGNOSIS — I5032 Chronic diastolic (congestive) heart failure: Secondary | ICD-10-CM | POA: Diagnosis not present

## 2014-04-10 DIAGNOSIS — I69920 Aphasia following unspecified cerebrovascular disease: Secondary | ICD-10-CM | POA: Diagnosis not present

## 2014-04-10 DIAGNOSIS — M6281 Muscle weakness (generalized): Secondary | ICD-10-CM | POA: Diagnosis not present

## 2014-04-10 DIAGNOSIS — H548 Legal blindness, as defined in USA: Secondary | ICD-10-CM | POA: Diagnosis not present

## 2014-04-12 NOTE — Telephone Encounter (Signed)
Patient saw Dr. Loletha Grayer 06/13/13 after triage call was taken. Was likely addressed at [place]

## 2014-04-15 ENCOUNTER — Encounter: Payer: Self-pay | Admitting: Cardiology

## 2014-04-16 DIAGNOSIS — M6281 Muscle weakness (generalized): Secondary | ICD-10-CM | POA: Diagnosis not present

## 2014-04-16 DIAGNOSIS — IMO0001 Reserved for inherently not codable concepts without codable children: Secondary | ICD-10-CM | POA: Diagnosis not present

## 2014-04-16 DIAGNOSIS — I69920 Aphasia following unspecified cerebrovascular disease: Secondary | ICD-10-CM | POA: Diagnosis not present

## 2014-04-16 DIAGNOSIS — E440 Moderate protein-calorie malnutrition: Secondary | ICD-10-CM | POA: Diagnosis not present

## 2014-04-16 DIAGNOSIS — I5032 Chronic diastolic (congestive) heart failure: Secondary | ICD-10-CM | POA: Diagnosis not present

## 2014-04-16 DIAGNOSIS — H548 Legal blindness, as defined in USA: Secondary | ICD-10-CM | POA: Diagnosis not present

## 2014-04-19 DIAGNOSIS — I5032 Chronic diastolic (congestive) heart failure: Secondary | ICD-10-CM | POA: Diagnosis not present

## 2014-04-19 DIAGNOSIS — I69920 Aphasia following unspecified cerebrovascular disease: Secondary | ICD-10-CM | POA: Diagnosis not present

## 2014-04-19 DIAGNOSIS — M6281 Muscle weakness (generalized): Secondary | ICD-10-CM | POA: Diagnosis not present

## 2014-04-19 DIAGNOSIS — E440 Moderate protein-calorie malnutrition: Secondary | ICD-10-CM | POA: Diagnosis not present

## 2014-04-19 DIAGNOSIS — H548 Legal blindness, as defined in USA: Secondary | ICD-10-CM | POA: Diagnosis not present

## 2014-04-19 DIAGNOSIS — IMO0001 Reserved for inherently not codable concepts without codable children: Secondary | ICD-10-CM | POA: Diagnosis not present

## 2014-04-24 DIAGNOSIS — IMO0001 Reserved for inherently not codable concepts without codable children: Secondary | ICD-10-CM | POA: Diagnosis not present

## 2014-04-24 DIAGNOSIS — M6281 Muscle weakness (generalized): Secondary | ICD-10-CM | POA: Diagnosis not present

## 2014-04-24 DIAGNOSIS — H548 Legal blindness, as defined in USA: Secondary | ICD-10-CM | POA: Diagnosis not present

## 2014-04-24 DIAGNOSIS — I69920 Aphasia following unspecified cerebrovascular disease: Secondary | ICD-10-CM | POA: Diagnosis not present

## 2014-04-24 DIAGNOSIS — I5032 Chronic diastolic (congestive) heart failure: Secondary | ICD-10-CM | POA: Diagnosis not present

## 2014-04-24 DIAGNOSIS — E440 Moderate protein-calorie malnutrition: Secondary | ICD-10-CM | POA: Diagnosis not present

## 2014-04-29 DIAGNOSIS — IMO0001 Reserved for inherently not codable concepts without codable children: Secondary | ICD-10-CM | POA: Diagnosis not present

## 2014-04-29 DIAGNOSIS — I5032 Chronic diastolic (congestive) heart failure: Secondary | ICD-10-CM | POA: Diagnosis not present

## 2014-04-29 DIAGNOSIS — E440 Moderate protein-calorie malnutrition: Secondary | ICD-10-CM | POA: Diagnosis not present

## 2014-04-29 DIAGNOSIS — I69920 Aphasia following unspecified cerebrovascular disease: Secondary | ICD-10-CM | POA: Diagnosis not present

## 2014-04-29 DIAGNOSIS — M6281 Muscle weakness (generalized): Secondary | ICD-10-CM | POA: Diagnosis not present

## 2014-04-29 DIAGNOSIS — H548 Legal blindness, as defined in USA: Secondary | ICD-10-CM | POA: Diagnosis not present

## 2014-05-02 DIAGNOSIS — I69920 Aphasia following unspecified cerebrovascular disease: Secondary | ICD-10-CM | POA: Diagnosis not present

## 2014-05-02 DIAGNOSIS — H548 Legal blindness, as defined in USA: Secondary | ICD-10-CM | POA: Diagnosis not present

## 2014-05-02 DIAGNOSIS — I5032 Chronic diastolic (congestive) heart failure: Secondary | ICD-10-CM | POA: Diagnosis not present

## 2014-05-02 DIAGNOSIS — M6281 Muscle weakness (generalized): Secondary | ICD-10-CM | POA: Diagnosis not present

## 2014-05-02 DIAGNOSIS — IMO0001 Reserved for inherently not codable concepts without codable children: Secondary | ICD-10-CM | POA: Diagnosis not present

## 2014-05-02 DIAGNOSIS — E440 Moderate protein-calorie malnutrition: Secondary | ICD-10-CM | POA: Diagnosis not present

## 2014-05-04 DIAGNOSIS — IMO0001 Reserved for inherently not codable concepts without codable children: Secondary | ICD-10-CM | POA: Diagnosis not present

## 2014-05-04 DIAGNOSIS — H4010X0 Unspecified open-angle glaucoma, stage unspecified: Secondary | ICD-10-CM | POA: Diagnosis not present

## 2014-05-04 DIAGNOSIS — H548 Legal blindness, as defined in USA: Secondary | ICD-10-CM | POA: Diagnosis not present

## 2014-05-04 DIAGNOSIS — E440 Moderate protein-calorie malnutrition: Secondary | ICD-10-CM | POA: Diagnosis not present

## 2014-05-04 DIAGNOSIS — I509 Heart failure, unspecified: Secondary | ICD-10-CM | POA: Diagnosis not present

## 2014-05-04 DIAGNOSIS — I5032 Chronic diastolic (congestive) heart failure: Secondary | ICD-10-CM | POA: Diagnosis not present

## 2014-05-04 DIAGNOSIS — M25569 Pain in unspecified knee: Secondary | ICD-10-CM | POA: Diagnosis not present

## 2014-05-04 DIAGNOSIS — I69920 Aphasia following unspecified cerebrovascular disease: Secondary | ICD-10-CM | POA: Diagnosis not present

## 2014-05-07 DIAGNOSIS — E440 Moderate protein-calorie malnutrition: Secondary | ICD-10-CM | POA: Diagnosis not present

## 2014-05-07 DIAGNOSIS — I69920 Aphasia following unspecified cerebrovascular disease: Secondary | ICD-10-CM | POA: Diagnosis not present

## 2014-05-07 DIAGNOSIS — H548 Legal blindness, as defined in USA: Secondary | ICD-10-CM | POA: Diagnosis not present

## 2014-05-07 DIAGNOSIS — I5032 Chronic diastolic (congestive) heart failure: Secondary | ICD-10-CM | POA: Diagnosis not present

## 2014-05-07 DIAGNOSIS — IMO0001 Reserved for inherently not codable concepts without codable children: Secondary | ICD-10-CM | POA: Diagnosis not present

## 2014-05-07 DIAGNOSIS — I509 Heart failure, unspecified: Secondary | ICD-10-CM | POA: Diagnosis not present

## 2014-05-09 DIAGNOSIS — I69920 Aphasia following unspecified cerebrovascular disease: Secondary | ICD-10-CM | POA: Diagnosis not present

## 2014-05-09 DIAGNOSIS — E440 Moderate protein-calorie malnutrition: Secondary | ICD-10-CM | POA: Diagnosis not present

## 2014-05-09 DIAGNOSIS — H548 Legal blindness, as defined in USA: Secondary | ICD-10-CM | POA: Diagnosis not present

## 2014-05-09 DIAGNOSIS — I509 Heart failure, unspecified: Secondary | ICD-10-CM | POA: Diagnosis not present

## 2014-05-09 DIAGNOSIS — IMO0001 Reserved for inherently not codable concepts without codable children: Secondary | ICD-10-CM | POA: Diagnosis not present

## 2014-05-09 DIAGNOSIS — I5032 Chronic diastolic (congestive) heart failure: Secondary | ICD-10-CM | POA: Diagnosis not present

## 2014-05-13 DIAGNOSIS — IMO0001 Reserved for inherently not codable concepts without codable children: Secondary | ICD-10-CM | POA: Diagnosis not present

## 2014-05-13 DIAGNOSIS — H548 Legal blindness, as defined in USA: Secondary | ICD-10-CM | POA: Diagnosis not present

## 2014-05-13 DIAGNOSIS — I509 Heart failure, unspecified: Secondary | ICD-10-CM | POA: Diagnosis not present

## 2014-05-13 DIAGNOSIS — I5032 Chronic diastolic (congestive) heart failure: Secondary | ICD-10-CM | POA: Diagnosis not present

## 2014-05-13 DIAGNOSIS — E440 Moderate protein-calorie malnutrition: Secondary | ICD-10-CM | POA: Diagnosis not present

## 2014-05-13 DIAGNOSIS — I69920 Aphasia following unspecified cerebrovascular disease: Secondary | ICD-10-CM | POA: Diagnosis not present

## 2014-05-14 ENCOUNTER — Ambulatory Visit (INDEPENDENT_AMBULATORY_CARE_PROVIDER_SITE_OTHER): Payer: Medicare Other | Admitting: Cardiovascular Disease

## 2014-05-14 ENCOUNTER — Encounter: Payer: Self-pay | Admitting: Cardiovascular Disease

## 2014-05-14 VITALS — BP 130/80 | HR 72 | Ht 72.0 in | Wt 308.2 lb

## 2014-05-14 DIAGNOSIS — I43 Cardiomyopathy in diseases classified elsewhere: Secondary | ICD-10-CM

## 2014-05-14 DIAGNOSIS — I10 Essential (primary) hypertension: Secondary | ICD-10-CM | POA: Diagnosis not present

## 2014-05-14 DIAGNOSIS — Z950 Presence of cardiac pacemaker: Secondary | ICD-10-CM

## 2014-05-14 DIAGNOSIS — I119 Hypertensive heart disease without heart failure: Secondary | ICD-10-CM

## 2014-05-14 DIAGNOSIS — I2510 Atherosclerotic heart disease of native coronary artery without angina pectoris: Secondary | ICD-10-CM | POA: Diagnosis not present

## 2014-05-14 DIAGNOSIS — I4891 Unspecified atrial fibrillation: Secondary | ICD-10-CM

## 2014-05-14 DIAGNOSIS — I513 Intracardiac thrombosis, not elsewhere classified: Secondary | ICD-10-CM

## 2014-05-14 DIAGNOSIS — I495 Sick sinus syndrome: Secondary | ICD-10-CM

## 2014-05-14 DIAGNOSIS — I619 Nontraumatic intracerebral hemorrhage, unspecified: Secondary | ICD-10-CM

## 2014-05-14 DIAGNOSIS — I219 Acute myocardial infarction, unspecified: Secondary | ICD-10-CM

## 2014-05-14 DIAGNOSIS — I610 Nontraumatic intracerebral hemorrhage in hemisphere, subcortical: Secondary | ICD-10-CM

## 2014-05-14 LAB — MDC_IDC_ENUM_SESS_TYPE_INCLINIC
Battery Voltage: 2.79 V
Brady Statistic AP VS Percent: 88 %
Brady Statistic AS VS Percent: 7 %
Date Time Interrogation Session: 20150929094509
Lead Channel Pacing Threshold Amplitude: 0.75 V
Lead Channel Pacing Threshold Amplitude: 2.25 V
Lead Channel Pacing Threshold Pulse Width: 0.4 ms
Lead Channel Pacing Threshold Pulse Width: 1 ms
Lead Channel Sensing Intrinsic Amplitude: 15.67 mV
Lead Channel Setting Pacing Amplitude: 1.5 V
Lead Channel Setting Pacing Amplitude: 4 V
Lead Channel Setting Sensing Sensitivity: 5.6 mV
MDC IDC MSMT BATTERY IMPEDANCE: 108 Ohm
MDC IDC MSMT BATTERY REMAINING LONGEVITY: 134 mo
MDC IDC MSMT LEADCHNL RA IMPEDANCE VALUE: 429 Ohm
MDC IDC MSMT LEADCHNL RA SENSING INTR AMPL: 4 mV
MDC IDC MSMT LEADCHNL RV IMPEDANCE VALUE: 728 Ohm
MDC IDC SET LEADCHNL RV PACING PULSEWIDTH: 1 ms
MDC IDC STAT BRADY AP VP PERCENT: 5 %
MDC IDC STAT BRADY AS VP PERCENT: 1 %

## 2014-05-14 NOTE — Progress Notes (Signed)
Patient ID: [name], male   DOB: 12/21/1945, 68 y.o.   MRN: [id number]     Reason for office visit Pacemaker followup  Ms. Sonderegger is here today primarily for a pacemaker check. Pacemaker function is normal. He has 94% atrial pacing, but very rare (5%) ventricular pacing). He has excellent parameters on the atrial lead, but has chronically elevated ventricular pacing thresholds (today 2.25 V at 1 ms, output is now programmed to 4 V at 1 ms). Since he requires ventricular pacing infrequently, battery longevity is estimated to be around 11 years.  Underlying rhythm is mild sinus bradycardia. Histogram distribution of heart rate is favorable. No programming changes were necessary today. Although he was in persistent atrial fibrillation between June and October of 2014, he has not had any fibrillation since then.  He has had multiple extensive hospitalizations, dating back to February of this year. He presented with a left parenchymal temporoparietal hematoma and required intubation. He had a pacemaker lead associated thrombus and developed a right lower lobe pulmonary embolism. He received a tracheostomy and gastric feeding tube and was transferred to inpatient nursing facility where he developed severe sepsis secondary to urinary tract infection and pneumonia. He was admitted to [facility] for sepsis and respiratory failure. After this result he returned to rehabilitation. In July he returned home where he has been receiving home nursing care. This will be discontinued this week and he will be going to [place] [facility]. He is legally blind and his daughter lives an hour away.  His history is notable for morbid obesity, insulin requiring diabetes mellitus, hypertension, obstructive sleep apnea on CPAP, mild valvular aortic stenosis, mild coronary atherosclerosis treated medically, legal blindness, severe diabetic peripheral neuropathy complicated by poorly healing wounds, chronic  venous insufficiency and a dual-chamber permanent pacemaker that was implanted in 2005 for sinus node dysfunction. He also has had PAF but has not been anticoagulated because of rectal bleeding and concerns about compliance, now secondary also to intracranial hemorrhage.     No Known Allergies  Current Outpatient Prescriptions  Medication Sig Dispense Refill  . acetaminophen (TYLENOL) 325 MG tablet Take 2 tablets (650 mg total) by mouth every 6 (six) hours as needed for fever (>99.5).      Marland Kitchen Amino Acids-Protein Hydrolys (FEEDING SUPPLEMENT, PRO-STAT SUGAR FREE 64,) LIQD Place 30 mLs into feeding tube 2 (two) times daily.  900 mL  0  . amiodarone (PACERONE) 200 MG tablet Take 200 mg by mouth daily.      Marland Kitchen antiseptic oral rinse (BIOTENE) LIQD 15 mLs by Mouth Rinse route QID.      Marland Kitchen aspirin 81 MG chewable tablet Place 1 tablet (81 mg total) into feeding tube daily.      Marland Kitchen atorvastatin (LIPITOR) 10 MG tablet Place 1 tablet (10 mg total) into feeding tube daily at 6 PM.      . bisacodyl (DULCOLAX) 10 MG suppository Place 1 suppository (10 mg total) rectally daily as needed for moderate constipation.  12 suppository  0  . brimonidine (ALPHAGAN) 0.2 % ophthalmic solution Place 1 drop into both eyes 3 (three) times daily.  5 mL  12  . carvedilol (COREG) 25 MG tablet Take 25 mg by mouth 2 (two) times daily with a meal.       . chlorhexidine (PERIDEX) 0.12 % solution 15 mLs by Mouth Rinse route 2 (two) times daily.  120 mL  0  . cloNIDine (CATAPRES) 0.2 MG tablet Take 0.5 tablets (0.1 mg  total) by mouth 2 (two) times daily.  60 tablet  1  . dorzolamide-timolol (COSOPT) 22.3-6.8 MG/ML ophthalmic solution Place 1 drop into both eyes 2 (two) times daily.      Marland Kitchen enoxaparin (LOVENOX) 40 MG/0.4ML injection Inject 0.4 mLs (40 mg total) into the skin daily.  0 Syringe    . fentaNYL (SUBLIMAZE) 0.05 MG/ML injection Inject 1-2 mLs (50-100 mcg total) into the vein every 2 (two) hours as needed for severe pain (to  maintain RASS goal.).  2 mL  0  . hydrALAZINE (APRESOLINE) 20 MG/ML injection Inject 0.5 mLs (10 mg total) into the vein every 2 (two) hours as needed (keep systolic less than or equal to 170 mmHg).  1 mL    . insulin aspart (NOVOLOG) 100 UNIT/ML injection Inject 0-20 Units into the skin every 4 (four) hours.  10 mL  11  . isosorbide dinitrate (ISORDIL) 10 MG tablet Place 1 tablet (10 mg total) into feeding tube 2 (two) times daily.      Marland Kitchen labetalol (NORMODYNE,TRANDATE) 5 MG/ML injection Inject 2-3 mLs (10-15 mg total) into the vein every 2 (two) hours as needed.  20 mL    . magnesium oxide (MAG-OX) 400 MG tablet Take 400 mg by mouth daily.      . methylcellulose (ARTIFICIAL TEARS) 1 % ophthalmic solution Place 1 drop into both eyes 2 (two) times daily.      . Nutritional Supplements (FEEDING SUPPLEMENT, VITAL HIGH PROTEIN,) LIQD liquid Place 1,000 mLs into feeding tube continuous.      . ondansetron (ZOFRAN) 4 MG/2ML SOLN injection Inject 2 mLs (4 mg total) into the vein every 6 (six) hours as needed for nausea or vomiting.  2 mL  0  . pantoprazole sodium (PROTONIX) 40 mg/20 mL PACK Place 20 mLs (40 mg total) into feeding tube at bedtime.  30 each    . pneumococcal 23 valent vaccine (PNU-IMMUNE) 25 MCG/0.5ML injection Inject 0.5 mLs into the muscle tomorrow at 10 am.  2.5 mL    . Water For Irrigation, Sterile (FREE WATER) SOLN Place 200 mLs into feeding tube every 6 (six) hours.       No current facility-administered medications for this visit.    Past Medical History  Diagnosis Date  . Diabetes type 2, uncontrolled   . Gout   . Hyperlipidemia   . Hypertension   . Obstructive sleep apnea     On CPAP. Last sleep study (06/2009) - Severe obstructive sleep apnea/hypopnea syndrome, apnea-hypopnea index 70.5 per hour with non positional events moderately loud snoring and oxygen desaturation to a nadir of 85% on room air.  . Benign prostatic hypertrophy   . Morbid obesity   . Cardiomyopathy,  hypertensive      diastolic dysfunction by [id number] echo  . Coronary artery disease      nonobstructive. 2 vessel. cardiac catheter in March 2005 showing 60% LAD occlusion, 30% circumflex occlusion.  . 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 Legally blind   . Herpes   . Occult blood positive stool      History of in August 2007.  colonoscopy in January 2008 showing normal colon with fair inadequate prep. By Dr. Silvano Rusk.  . Incarcerated ventral hernia     H/O. S/P repair ventral hernia repair 07/2005.  . Diabetic ulcer of thigh     H/O Rt thigh ulcer.  . Diabetic peripheral neuropathy   . Exertional shortness of breath     "  comes and goes" (07/18/13)  . GERD (gastroesophageal reflux disease)   . Degenerative joint disease      Right knee.  . Arthritis     "all over" (07/18/13)  . Sepsis 08/26/2013  . Atrial fibrillation     not Candidate for Coumadin d/t rectal bleeding  . Hypotension 09/05/2013  . LBBB (left bundle branch block)     Past Surgical History  Procedure Laterality Date  . Pacemaker lead revision  12/2003     likely microdislodgment  of right ventricular lead  . Ventral hernia repair  07/2005     S/P laparotomy, lysis of adhesions, repair of incarcerated ventral hernia with  partial omentectomy.. Performed by Dr. Marlou Starks.  . Hernia repair    . Cataract extraction, bilateral Bilateral   . Refractive surgery Bilateral   . Eye surgery    . 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 Amputation Left 06/22/2013    Procedure: AMPUTATION FOOT;  Surgeon: Newt Minion, MD;  Location: [facility];  Service: Orthopedics;  Laterality: Left;  Left Midfoot Amputation  . Insert / replace / remove pacemaker  12/03/2003    Secondary to symptomatic bradycardia. DDDR pacemaker. By Dr. Rollene Fare.  . Insert / replace / remove pacemaker  2012-12-02    "old pacemaker died; had new one put in" (07-18-2013)  . Esophagogastroduodenoscopy N/A 10/12/2013     Procedure: ESOPHAGOGASTRODUODENOSCOPY (EGD);  Surgeon: Gwenyth Ober, MD;  Location: [facility];  Service: General;  Laterality: N/A;  bedside  . Peg placement N/A 10/12/2013    Procedure: PERCUTANEOUS ENDOSCOPIC GASTROSTOMY (PEG) PLACEMENT;  Surgeon: Gwenyth Ober, MD;  Location: [place];  Service: General;  Laterality: N/A;  . Venous doppler  01/17/2013    No evidence of thrombus or thrombophlebitis. Left GSVs appear enlarged and demonstrate valvular insuffiiciency. Bilateral SSVs appeared patent with no valvular insufficiency seen.  . Cardiac catheterization  10/22/2003    Dilatation of the LV with preserved LV systolic function. Tortous aorta, but no AAA. Moderate disease with 50-60% area of narrowing in the circumflex after the second OM and 70% mid narrowing in the LAD. No intervention.  . Cardiovascular stress test  01/23/2010    Perfusion defect seen in inferior myocardial region is consistent with diaphragmatic attenuation. Remaining myocardium demonstrates normal myocardial perfusion with no evidence of ischemia or infarct. Stress ECG was non-diagnostic due to LBBB.  . Transthoracic echocardiogram  01/01/2013    EF 50-55%, ventricular septum-thickness moderately increased    Family History  Problem Relation Age of Onset  . Coronary artery disease Sister   . Heart disease Sister   . Allergies Sister   . Asthma Brother   . Rheum arthritis Brother     History   Social History  . Marital Status: Single    Spouse Name: N/A    Number of Children: Y  . Years of Education: N/A   Occupational History  . disabled    Social History Main Topics  . Smoking status: Former Smoker -- 0.00 packs/day for 0 years    Types: Cigarettes    Quit date: 08/17/2003  . Smokeless tobacco: Never Used     Comment: started at age 42.  only smoked on occ- very rarely.0  . Alcohol Use: Yes     Comment: 07/18/2013 "drank some; never had a problem w/it"  . Drug Use: No  . Sexual Activity: No    Other Topics Concern  . Not on file  Social History Narrative  . No narrative on file    Review of systems: Legally blind. Activity is very limited, which does not allow detailed functional assessment The patient specifically denies any chest pain at rest or with exertion, dyspnea at rest or with exertion, orthopnea, paroxysmal nocturnal dyspnea, syncope, palpitations, focal neurological deficits, intermittent claudication, lower extremity edema, unexplained weight gain, cough, hemoptysis or wheezing.  The patient also denies abdominal pain, nausea, vomiting, dysphagia, diarrhea, constipation, polyuria, polydipsia, dysuria, hematuria, frequency, urgency, abnormal bleeding or bruising, fever, chills, unexpected weight changes, mood swings, change in skin or hair texture, change in voice quality, auditory or visual problems, allergic reactions or rashes, new musculoskeletal complaints other than usual "aches and pains".   PHYSICAL EXAM BP 130/80  Pulse 72  Ht 6' (1.829 m)  Wt 139.799 kg (308 lb 3.2 oz)  BMI 41.79 kg/m2 Exam is limited by morbid obesity  General: Alert, oriented x3, no distress  Head: no evidence of trauma, PERRL, EOMI, no exophtalmos or lid lag, no myxedema, no xanthelasma; normal ears, nose and oropharynx  Neck: Unable to evaluate jugular venous pulsations and no hepatojugular reflux; brisk carotid pulses without delay and no carotid bruits . Healed tracheostomy scar Chest: clear to auscultation, no signs of consolidation by percussion or palpation, normal fremitus, symmetrical and full respiratory excursions  Cardiovascular: Cannot identify the apical impulse, irregular rhythm, normal first and second heart sounds, no rubs or gallops , rate to-3/6 aortic ejection murmur that is early peaking, grade 1-6 holosystolic murmur left lower sternal border Abdomen: no tenderness or distention, no masses by palpation, no abnormal pulsatility or arterial bruits, normal bowel  sounds, no hepatosplenomegaly  Extremities: He is wearing surgical boots on both feet. He has undergone amputation of the left forefoot. No clubbing, cyanosis , no more than trivial bilateral ankle edema; 2+ radial, ulnar and brachial pulses bilaterally; 2+ right femoral, 1+posterior tibial and nonpalpable dorsalis pedis pulses; 2+ left femoral, nonpalpable posterior tibial and dorsalis pedis pulses; no subclavian or femoral bruits  Neurological: grossly nonfocal   EKG: Atrial paced ventricular sensed rhythm, QS in leads V1 through V2 and generalized nonspecific T wave flattening   Lipid Panel     Component Value Date/Time   CHOL 134 10/05/2013 0500   TRIG 111 10/29/2013 0611   HDL 37* 10/05/2013 0500   CHOLHDL 3.6 10/05/2013 0500   VLDL 30 10/05/2013 0500   LDLCALC 67 10/05/2013 0500    BMET    Component Value Date/Time   NA 138 11/15/2013 0630   K 4.5 11/15/2013 0630   CL 100 11/15/2013 0630   CO2 28 11/15/2013 0630   GLUCOSE 148* 11/15/2013 0630   BUN 23 11/15/2013 0630   CREATININE 1.24 11/15/2013 0630   CREATININE 2.28* 04/23/2013 1216   CALCIUM 8.9 11/15/2013 0630   GFRNONAA 59* 11/15/2013 0630   GFRAA 69* 11/15/2013 0630     ASSESSMENT AND PLAN Atrial fibrillation  Fortunately, he is now maintaining normal rhythm on amiodarone. Unfortunately, he cannot be anticoagulated since has had intracranial bleeding. He is at high risk of cardioembolic events due to multiple comorbidities of hypertension, diabetes mellitus, congestive heart failure (CHADSVasc 4).  While on amiodarone needs periodic review of liver function tests and thyroid function tests. Most recent LFTs were performed in May 2015 at [facility]. Most recent thyroid function tests that I see are from March 2015. We'll repeat these labs in December, when he comes back for another pacemaker check Coronary artery disease  Currently  asymptomatic. Coronary angiography in 2005 showed moderate LAD and circumflex obstruction  Cardiomyopathy, hypertensive   No clinical evidence of hypervolemia. Functional class is not assessed due to orthopedic problems  CKD (chronic kidney disease) stage 3, GFR 30-59 ml/min  Without renal function this year due to multiple illnesses, but baseline creatinine seems to be 1.1-1.2. May still benefit from ACE inhibitor therapy to prevent progression of diabetic nephropathy. HYPERTENSION  Target BP less than 130/80 mm Hg. Restart ACE inhibitor if renal function permits when rechecked in December.  Mild aortic stenosis Mean gradient 18 mm Hg, not yet clinically significant Recent pulmonary embolism, likely secondary to pacemaker lead associated thrombus Recent intracranial hemorrhage  Patient Instructions  Your physician recommends that you schedule a follow-up appointment in: 6 months with Dr.Elliette Seabolt       Orders Placed This Encounter  Procedures  . EKG 12-Lead   No orders of the defined types were placed in this encounter.    Holli Humbles, MD, [place] [phone] office [phone] pager

## 2014-05-14 NOTE — Patient Instructions (Signed)
Your physician recommends that you schedule a follow-up appointment in: 6 months with Dr.Croitoru

## 2014-05-23 ENCOUNTER — Encounter: Payer: Self-pay | Admitting: Cardiovascular Disease

## 2014-07-25 ENCOUNTER — Encounter (HOSPITAL_COMMUNITY): Payer: Self-pay | Admitting: Cardiovascular Disease

## 2014-08-27 ENCOUNTER — Encounter: Payer: Self-pay | Admitting: *Deleted

## 2014-10-28 ENCOUNTER — Telehealth (HOSPITAL_COMMUNITY): Payer: Self-pay | Admitting: Cardiovascular Disease

## 2014-10-28 NOTE — Telephone Encounter (Signed)
Received records from [facility] for appointment on 10/29/14 with Dr Sallyanne Kuster.  Records given to [facility] (medical records) for Dr Croitoru's schedule on 10/29/14. lp

## 2014-10-29 ENCOUNTER — Encounter: Payer: Self-pay | Admitting: Cardiovascular Disease

## 2014-10-29 ENCOUNTER — Ambulatory Visit (INDEPENDENT_AMBULATORY_CARE_PROVIDER_SITE_OTHER): Payer: Medicare (Managed Care) | Admitting: Cardiovascular Disease

## 2014-10-29 VITALS — BP 148/86 | HR 78 | Resp 16 | Ht 72.0 in | Wt 341.9 lb

## 2014-10-29 DIAGNOSIS — I10 Essential (primary) hypertension: Secondary | ICD-10-CM

## 2014-10-29 DIAGNOSIS — I2510 Atherosclerotic heart disease of native coronary artery without angina pectoris: Secondary | ICD-10-CM | POA: Diagnosis not present

## 2014-10-29 DIAGNOSIS — I43 Cardiomyopathy in diseases classified elsewhere: Secondary | ICD-10-CM

## 2014-10-29 DIAGNOSIS — I110 Hypertensive heart disease with heart failure: Secondary | ICD-10-CM

## 2014-10-29 DIAGNOSIS — N183 Chronic kidney disease, stage 3 unspecified: Secondary | ICD-10-CM

## 2014-10-29 DIAGNOSIS — Z79899 Other long term (current) drug therapy: Secondary | ICD-10-CM

## 2014-10-29 DIAGNOSIS — I480 Paroxysmal atrial fibrillation: Secondary | ICD-10-CM

## 2014-10-29 DIAGNOSIS — E785 Hyperlipidemia, unspecified: Secondary | ICD-10-CM

## 2014-10-29 DIAGNOSIS — Z950 Presence of cardiac pacemaker: Secondary | ICD-10-CM

## 2014-10-29 DIAGNOSIS — I429 Cardiomyopathy, unspecified: Secondary | ICD-10-CM

## 2014-10-29 LAB — MDC_IDC_ENUM_SESS_TYPE_INCLINIC
Battery Impedance: 154 Ohm
Battery Voltage: 2.79 V
Brady Statistic AP VP Percent: 3 %
Date Time Interrogation Session: 20160315104143
Lead Channel Impedance Value: 412 Ohm
Lead Channel Impedance Value: 783 Ohm
Lead Channel Pacing Threshold Amplitude: 0.75 V
Lead Channel Pacing Threshold Amplitude: 2.5 V
Lead Channel Pacing Threshold Pulse Width: 0.4 ms
Lead Channel Setting Pacing Amplitude: 1.5 V
Lead Channel Setting Pacing Amplitude: 4 V
Lead Channel Setting Pacing Pulse Width: 1 ms
Lead Channel Setting Sensing Sensitivity: 5.6 mV
MDC IDC MSMT BATTERY REMAINING LONGEVITY: 124 mo
MDC IDC MSMT LEADCHNL RV PACING THRESHOLD PULSEWIDTH: 0.4 ms
MDC IDC MSMT LEADCHNL RV SENSING INTR AMPL: 16 mV
MDC IDC STAT BRADY AP VS PERCENT: 93 %
MDC IDC STAT BRADY AS VP PERCENT: 0 %
MDC IDC STAT BRADY AS VS PERCENT: 3 %

## 2014-10-29 NOTE — Patient Instructions (Addendum)
Your physician recommends that you return for lab work in: FASTING at [facility].    Your physician recommends that you schedule a follow-up appointment in: 6 months with Dr.Croitoru + pacemaker check ([place])

## 2014-10-29 NOTE — Progress Notes (Signed)
Patient ID: [name], male   DOB: 1946/05/09, 69 y.o.   MRN: [id number]     Cardiology Office Note   Date:  10/29/2014   ID:  [name], DOB 23-Nov-1945, MRN [id number]  PCP:  Sherian Maroon, MD  Cardiologist:   Sanda Klein, MD   Chief Complaint  Patient presents with  . 6 month visit    c/o chest tightness at rest more frequently here recently; c/o SOB; socks make indents on legs      History of Present Illness: [name] is a 69 y.o. male who presents for pacemaker check (sinus node dysfunction), chronic diastolic heart failure, asymptomatic moderate coronary artery disease and a history of paroxysmal atrial fibrillation. He has a history of intracranial hemorrhage and is not on anticoagulation.  Pacemaker function is unchanged from previous visits. He has 97% atrial pacing and less than 4% ventricular pacing. His atrial lead parameters are excellent. He has chronically elevated ventricular pacing thresholds, but since ventricular pacing is rare he still has roughly 10 years of estimated generator longevity. Most recently tested ventricular pacing threshold was 2.25 V at 1 ms pulse width. The device is programmed at 4 V at 1 ms pulse width in the ventricle. He has not had any atrial fibrillation in well over a year. He had lengthy periods of persistent atrial fibrillation in June and October 2014. He is on chronic amiodarone therapy.  He complains of chest pain that occurs when he twists his upper body in a certain way. He does not have any symptoms that sound like angina. 2005 he had coronary angiography that showed moderate obstructive lesions in the LAD and left circumflex arteries. In 2011 he had a low risk nuclear perfusion study consistent with diaphragmatic attenuation artifact. Left ventricular systolic function is normal. He has a remote history of acute pulmonary edema felt to be secondary to diastolic dysfunction. He has mild chronic lower extremity  edema that is felt to be primarily secondary to peripheral venous insufficiency. He has gained a lot of weight since his last appointment. He does not know what his last hemoglobin A1c was.  He had multiple hospitalizations in 2015: in February, he presented with a left parenchymal temporoparietal hematoma and required intubation. He had a pacemaker lead associated thrombus and developed a right lower lobe pulmonary embolism. He received a tracheostomy and gastric feeding tube and was transferred to inpatient nursing facility where he developed severe sepsis secondary to urinary tract infection and pneumonia. He was admitted to [facility] for sepsis and respiratory failure. After this result he returned to rehabilitation. In July 2015 he returned home and he goes daily to [place] activities. He is legally blind and his daughter lives an hour away.  His history is notable for morbid obesity, insulin requiring diabetes mellitus, hypertension, obstructive sleep apnea on CPAP, mild valvular aortic stenosis, mild coronary atherosclerosis treated medically, legal blindness, severe diabetic peripheral neuropathy complicated by poorly healing wounds, chronic venous insufficiency and a dual-chamber permanent pacemaker that was implanted in 2005 for sinus node dysfunction. He also has had PAF but has not been anticoagulated because of rectal bleeding and concerns about compliance, now secondary also to intracranial hemorrhage.   Past Medical History  Diagnosis Date  . Diabetes type 2, uncontrolled   . Gout   . Hyperlipidemia   . Hypertension   . Obstructive sleep apnea     On CPAP. Last sleep study (06/2009) - Severe obstructive sleep apnea/hypopnea syndrome, apnea-hypopnea index 70.5 per  hour with non positional events moderately loud snoring and oxygen desaturation to a nadir of 85% on room air.  . Benign prostatic hypertrophy   . Morbid obesity   . Cardiomyopathy, hypertensive       diastolic dysfunction by [id number] echo  . Coronary artery disease      nonobstructive. 2 vessel. cardiac catheter in 12-07-2003 showing 60% LAD occlusion, 30% circumflex occlusion.  . Pacemaker Dec 07, 2003     secondary to bradycardia  . Legally blind   . Herpes   . Occult blood positive stool      History of in August 2007.  colonoscopy in January 2008 showing normal colon with fair inadequate prep. By Dr. Silvano Rusk.  . Incarcerated ventral hernia     H/O. S/P repair ventral hernia repair 07/2005.  . Diabetic ulcer of thigh     H/O Rt thigh ulcer.  . Diabetic peripheral neuropathy   . Exertional shortness of breath     "comes and goes" (2013/07/22)  . GERD (gastroesophageal reflux disease)   . Degenerative joint disease      Right knee.  . Arthritis     "all over" (07-22-13)  . Sepsis 08/26/2013  . Atrial fibrillation     not Candidate for Coumadin d/t rectal bleeding  . Hypotension 09/05/2013  . LBBB (left bundle branch block)     Past Surgical History  Procedure Laterality Date  . Pacemaker lead revision  12/2003     likely microdislodgment  of right ventricular lead  . Ventral hernia repair  07/2005     S/P laparotomy, lysis of adhesions, repair of incarcerated ventral hernia with  partial omentectomy.. Performed by Dr. Marlou Starks.  . Hernia repair    . Cataract extraction, bilateral Bilateral   . Refractive surgery Bilateral   . Eye surgery    . 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 Amputation Left 06/22/2013    Procedure: AMPUTATION FOOT;  Surgeon: Newt Minion, MD;  Location: [place];  Service: Orthopedics;  Laterality: Left;  Left Midfoot Amputation  . Insert / replace / remove pacemaker  12-07-2003    Secondary to symptomatic bradycardia. DDDR pacemaker. By Dr. Rollene Fare.  . Insert / replace / remove pacemaker  12/06/12    "old pacemaker died; had new one put in" (07/22/13)  . Esophagogastroduodenoscopy N/A 10/12/2013    Procedure:  ESOPHAGOGASTRODUODENOSCOPY (EGD);  Surgeon: Gwenyth Ober, MD;  Location: [place];  Service: General;  Laterality: N/A;  bedside  . Peg placement N/A 10/12/2013    Procedure: PERCUTANEOUS ENDOSCOPIC GASTROSTOMY (PEG) PLACEMENT;  Surgeon: Gwenyth Ober, MD;  Location: [place];  Service: General;  Laterality: N/A;  . Venous doppler  01/17/2013    No evidence of thrombus or thrombophlebitis. Left GSVs appear enlarged and demonstrate valvular insuffiiciency. Bilateral SSVs appeared patent with no valvular insufficiency seen.  . Cardiac catheterization  10/22/2003    Dilatation of the LV with preserved LV systolic function. Tortous aorta, but no AAA. Moderate disease with 50-60% area of narrowing in the circumflex after the second OM and 70% mid narrowing in the LAD. No intervention.  . Cardiovascular stress test  01/23/2010    Perfusion defect seen in inferior myocardial region is consistent with diaphragmatic attenuation. Remaining myocardium demonstrates normal myocardial perfusion with no evidence of ischemia or infarct. Stress ECG was non-diagnostic due to LBBB.  . Transthoracic echocardiogram  01/01/2013    EF 50-55%, ventricular septum-thickness moderately increased  .  Permanent pacemaker generator change N/A 11/03/2012    Procedure: PERMANENT PACEMAKER GENERATOR CHANGE;  Surgeon: Sanda Klein, MD;  Location: [place] [facility];  Service: Cardiovascular;  Laterality: N/A;     Current Outpatient Prescriptions  Medication Sig Dispense Refill  . acetaminophen (TYLENOL) 325 MG tablet Take 2 tablets (650 mg total) by mouth every 6 (six) hours as needed for fever (>99.5).    Marland Kitchen Amino Acids-Protein Hydrolys (FEEDING SUPPLEMENT, PRO-STAT SUGAR FREE 64,) LIQD Place 30 mLs into feeding tube 2 (two) times daily. 900 mL 0  . amiodarone (PACERONE) 200 MG tablet Take 200 mg by mouth daily.    Marland Kitchen antiseptic oral rinse (BIOTENE) LIQD 15 mLs by Mouth Rinse route QID.    Marland Kitchen aspirin 81 MG chewable tablet Place 1  tablet (81 mg total) into feeding tube daily.    Marland Kitchen atorvastatin (LIPITOR) 10 MG tablet Place 1 tablet (10 mg total) into feeding tube daily at 6 PM. (Patient taking differently: Place 40 mg into feeding tube daily at 6 PM. )    . bisacodyl (DULCOLAX) 10 MG suppository Place 1 suppository (10 mg total) rectally daily as needed for moderate constipation. 12 suppository 0  . brimonidine (ALPHAGAN) 0.2 % ophthalmic solution Place 1 drop into both eyes 3 (three) times daily. 5 mL 12  . carvedilol (COREG) 25 MG tablet Take 12.5 mg by mouth 2 (two) times daily with a meal.     . chlorhexidine (PERIDEX) 0.12 % solution 15 mLs by Mouth Rinse route 2 (two) times daily. 120 mL 0  . furosemide (LASIX) 40 MG tablet Take 80 mg by mouth 2 (two) times daily.    Marland Kitchen gabapentin (NEURONTIN) 100 MG capsule Take 300 mg by mouth 2 (two) times daily.    . Insulin Detemir (LEVEMIR [place]) Inject 40 Units into the skin 2 (two) times daily.    . Liraglutide 18 MG/3ML SOPN Inject 1.2 mg into the skin daily.    . methylcellulose (ARTIFICIAL TEARS) 1 % ophthalmic solution Place 1 drop into both eyes 2 (two) times daily.    . Nutritional Supplements (FEEDING SUPPLEMENT, VITAL HIGH PROTEIN,) LIQD liquid Place 1,000 mLs into feeding tube continuous.    . pantoprazole sodium (PROTONIX) 40 mg/20 mL PACK Place 20 mLs (40 mg total) into feeding tube at bedtime. 30 each   . pneumococcal 23 valent vaccine (PNU-IMMUNE) 25 MCG/0.5ML injection Inject 0.5 mLs into the muscle tomorrow at 10 am. 2.5 mL   . valsartan (DIOVAN) 160 MG tablet Take 160 mg by mouth daily.    Marland Kitchen lidocaine (LIDODERM) 5 % Place onto the skin daily.     No current facility-administered medications for this visit.    Allergies:   Review of patient's allergies indicates no known allergies.    Social History:  The patient  reports that he quit smoking about 11 years ago. His smoking use included Cigarettes. He smoked 0.00 packs per day for 0 years. He has never used  smokeless tobacco. He reports that he drinks alcohol. He reports that he does not use illicit drugs.   Family History:  The patient's family history includes Allergies in his sister; Asthma in his brother; Coronary artery disease in his sister; Heart disease in his sister; Rheum arthritis in his brother.    ROS:  Please see the history of present illness.   Legally blind. Activity is very limited, which does not allow detailed functional assessment. Mild ankle edema (indentation from his socks).  The patient specifically denies  any chest painwith exertion, dyspnea at rest or with exertion, orthopnea, paroxysmal nocturnal dyspnea, syncope, palpitations, focal neurological deficits, intermittent claudication, unexplained weight gain, cough, hemoptysis or wheezing.  The patient also denies abdominal pain, nausea, vomiting, dysphagia, diarrhea, constipation, polyuria, polydipsia, dysuria, hematuria, frequency, urgency, abnormal bleeding or bruising, fever, chills, unexpected weight changes, mood swings, change in skin or hair texture, change in voice quality, auditory or visual problems, allergic reactions or rashes, new musculoskeletal complaints other than usual "aches and pains". Otherwise, review of systems are positive for none.   All other systems are reviewed and negative.    PHYSICAL EXAM: VS:  BP 148/86 mmHg  Pulse 78  Resp 16  Ht 6' (1.829 m)  Wt 341 lb 14.4 oz (155.085 kg)  BMI 46.36 kg/m2 , BMI Body mass index is 46.36 kg/(m^2).  Recheck BP 138/87 mmHg Exam is limited by morbid obesity  General: Alert, oriented x3, no distress  Head: no evidence of trauma, PERRL, EOMI, no exophtalmos or lid lag, no myxedema, no xanthelasma; normal ears, nose and oropharynx  Neck: Unable to evaluate jugular venous pulsations and no hepatojugular reflux; brisk carotid pulses without delay and no carotid bruits . Healed tracheostomy scar Chest: clear to auscultation, no signs of consolidation by  percussion or palpation, normal fremitus, symmetrical and full respiratory excursions  Cardiovascular: Cannot identify the apical impulse, irregular rhythm, normal first and second heart sounds, no rubs or gallops , rate to-3/6 aortic ejection murmur that is early peaking, grade 1-6 holosystolic murmur left lower sternal border Abdomen: no tenderness or distention, no masses by palpation, no abnormal pulsatility or arterial bruits, normal bowel sounds, no hepatosplenomegaly  Extremities: He has undergone amputation of the left forefoot. No clubbing, cyanosis , no more than trivial bilateral ankle edema; 2+ radial, ulnar and brachial pulses bilaterally; 2+ right femoral, 1+posterior tibial and nonpalpable dorsalis pedis pulses; 2+ left femoral, nonpalpable posterior tibial and dorsalis pedis pulses; no subclavian or femoral bruits  Neurological: grossly nonfocal   EKG:  EKG is ordered today. The ekg ordered today demonstrates atrial paced, ventricular sensed rhythm, generalized low voltage QRS due to obesity, QS pattern in leads V1-V2, unchanged from previous tracing. QTC 450 ms   Recent Labs: 11/01/2013: Magnesium 1.9 11/05/2013: ALT 10 11/15/2013: BUN 23; Creatinine 1.24; Hemoglobin 9.9*; Platelets 189; Potassium 4.5; Sodium 138    Lipid Panel    Component Value Date/Time   CHOL 134 10/05/2013 0500   TRIG 111 10/29/2013 0611   HDL 37* 10/05/2013 0500   CHOLHDL 3.6 10/05/2013 0500   VLDL 30 10/05/2013 0500   LDLCALC 67 10/05/2013 0500      Wt Readings from Last 3 Encounters:  10/29/14 341 lb 14.4 oz (155.085 kg)  05/14/14 308 lb 3.2 oz (139.799 kg)  04/08/14 301 lb 1.6 oz (136.578 kg)      Other studies Reviewed: Additional studies/ records that were reviewed today include: Device interrogation as above   ASSESSMENT AND PLAN:  Sinus node dysfunction Dual-chamber permanent pacemaker with normal function of the atrial lead and high ventricular pacing thresholds. Since  ventricular pacing is almost never needed, no plan for lead revision at this time. Due to his blindness he has not been able to perform remote pacemaker downloads. Will continue with office checks every 6 months.  History of atrial fibrillation, persistent  Fortunately, he is now maintaining normal rhythm on amiodarone. Unfortunately, he cannot be anticoagulated since has had intracranial bleeding. He is at high risk of cardioembolic events due  to multiple comorbidities of hypertension, diabetes mellitus, congestive heart failure (CHADSVasc 4).  While on amiodarone needs periodic review of liver function tests and thyroid function tests.   Coronary artery disease  Currently asymptomatic. Coronary angiography in 2005 showed moderate LAD and circumflex obstruction. Low risk nuclear perfusion scan in 2011.  Cardiomyopathy, hypertensive  No clinical evidence of hypervolemia. Functional class is not assessed due to orthopedic problems   CKD (chronic kidney disease) stage 3, GFR 30-59 ml/min  Volatile renal function last year due to multiple illnesses, but baseline creatinine seems to be 1.1-1.2. May still benefit from ACE inhibitor therapy to prevent progression of diabetic nephropathy.   HYPERTENSION  Target BP less than 130/80 mm Hg. Restart ACE inhibitor if renal function permits based on current labs.   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  The patient does not have concerns regarding medicines.  The following changes have been made:  no change  Labs/ tests ordered today include:   Orders Placed This Encounter  Procedures  . Comprehensive metabolic panel  . TSH  . Lipid panel  .  EKG 12-Lead   Patient Instructions  Your physician recommends that you return for lab work in: FASTING at [facility].    Your physician recommends that you schedule a follow-up appointment in: 6 months with Dr.Jahzir Strohmeier + pacemaker check Pearline Cables)     Mikael Spray, MD  10/29/2014 9:19 AM    [facility] [place], [place], [place]  [address] Phone: [phone]; Fax: [phone]

## 2014-10-31 ENCOUNTER — Encounter: Payer: Self-pay | Admitting: Cardiovascular Disease

## 2014-11-13 ENCOUNTER — Encounter: Payer: Self-pay | Admitting: Cardiovascular Disease

## 2015-01-15 IMAGING — CR DG CHEST 2V
2 series · 2 of 2 positions shown · non-contrast
Comparison: Chest x-ray of 09/23/2011

CLINICAL DATA: Hypertension, weakness

CHEST - 2 VIEW

[w chest lat]
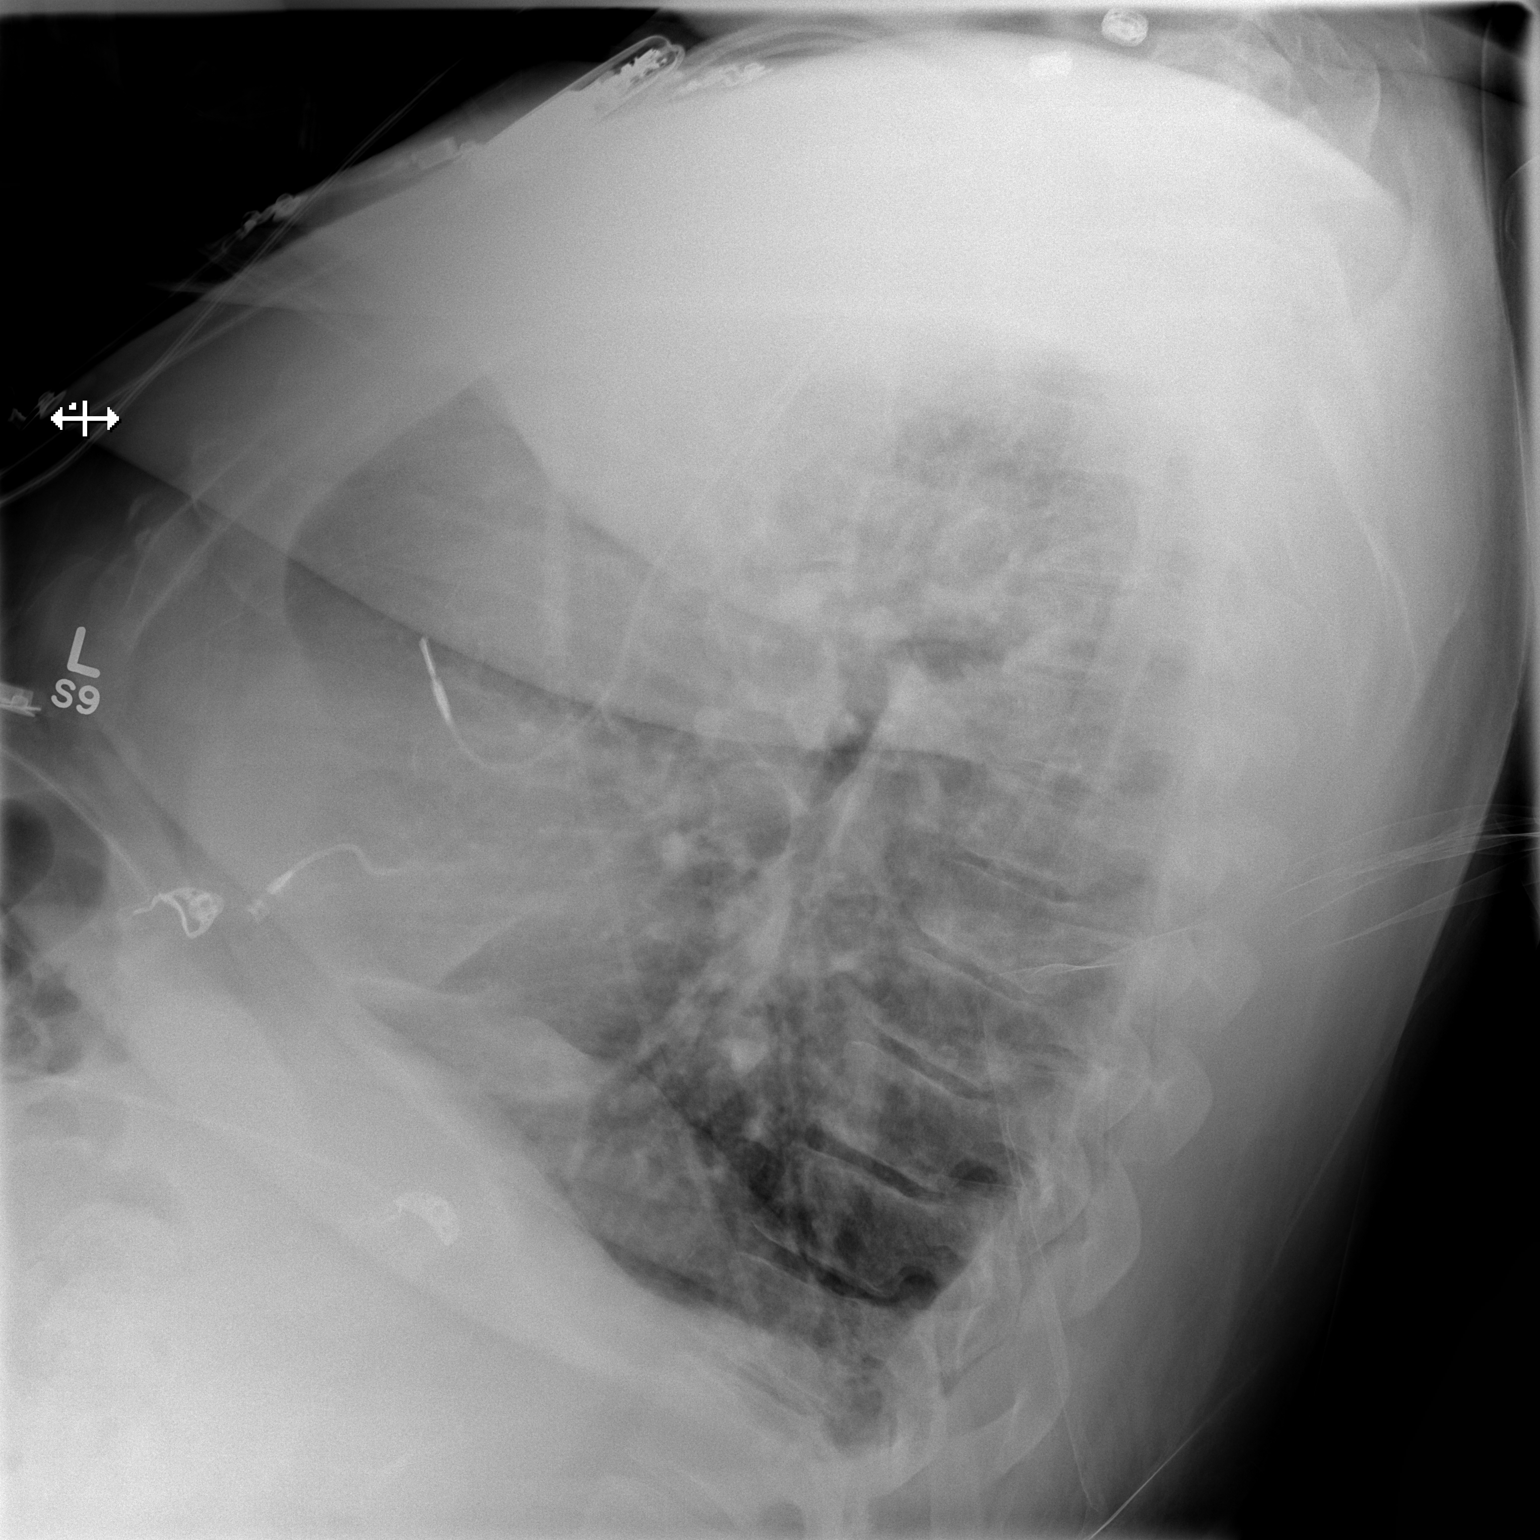

[x chest ap]
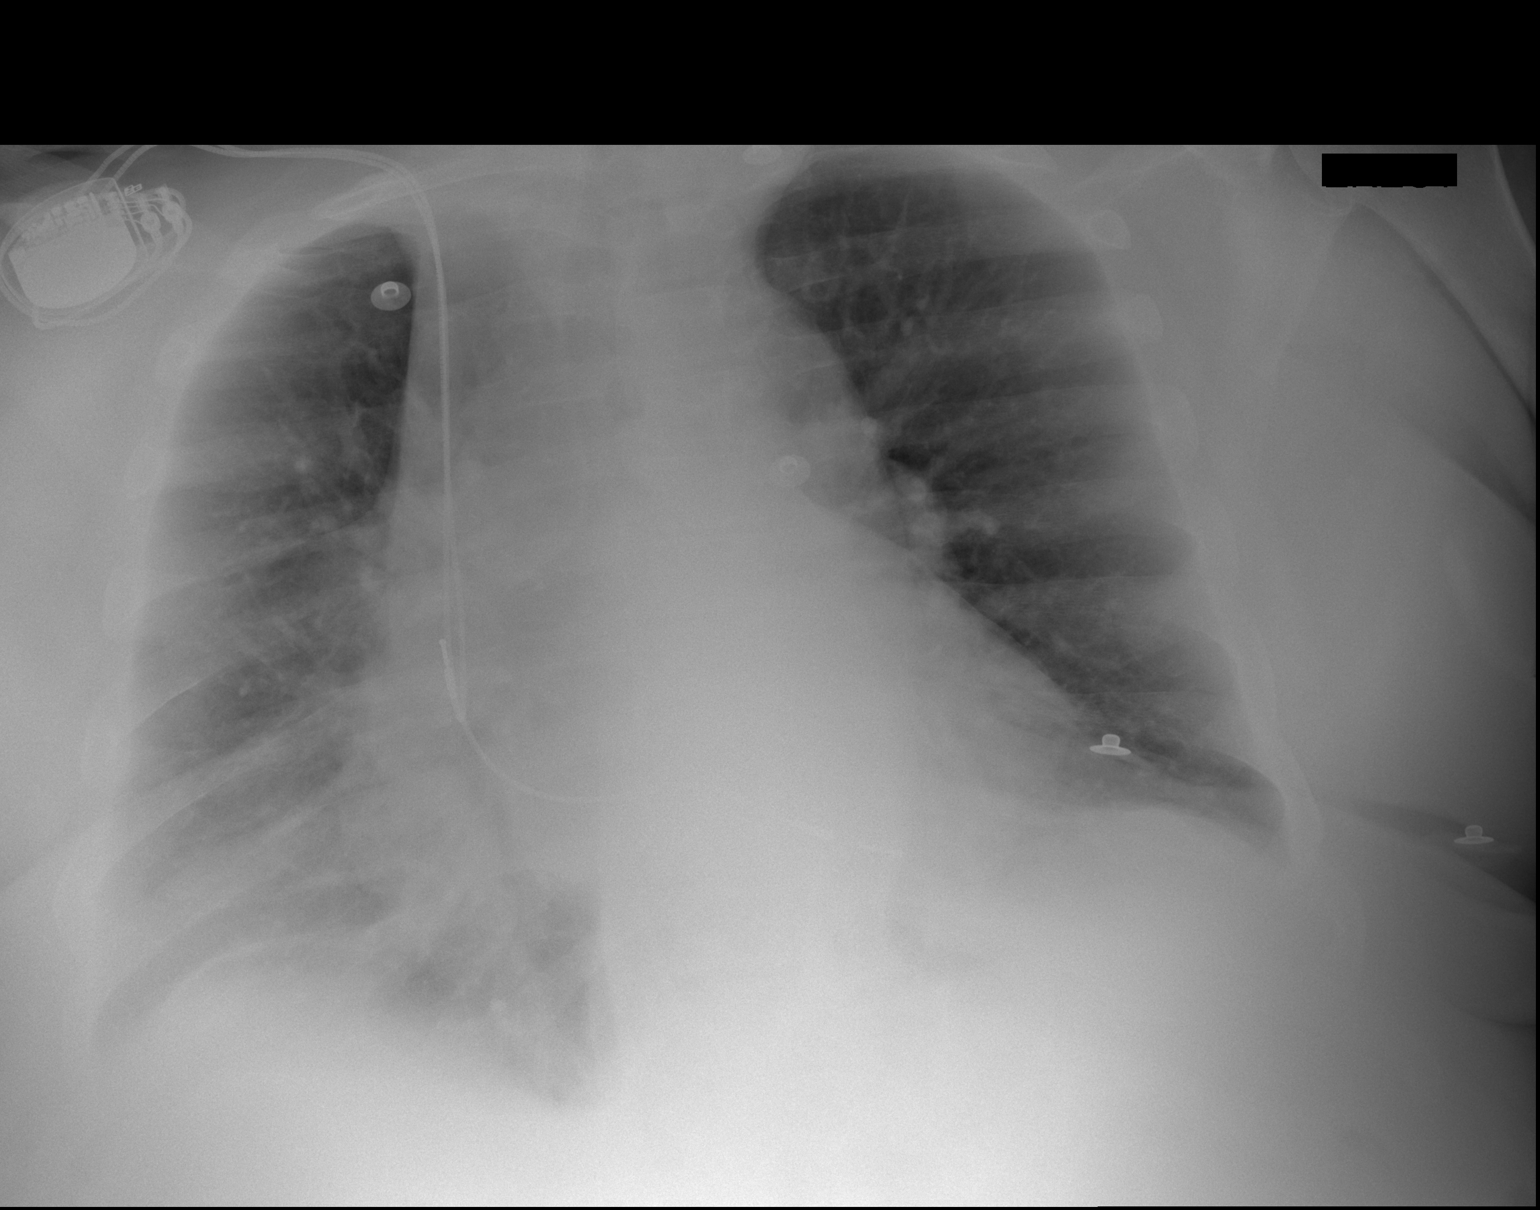

[2 of 2 positions shown; findings below may reference images not displayed]

FINDINGS: The lungs are hyperaerated consistent with emphysema
with flattened hemidiaphragms.  No focal infiltrate or effusion is
seen.  Cardiomegaly is stable.  A permanent pacemaker remains.  No
skeletal abnormality is noted.
IMPRESSION: Emphysema. Moderate cardiomegaly with pacer.  No active lung
disease.

## 2015-04-15 ENCOUNTER — Ambulatory Visit (INDEPENDENT_AMBULATORY_CARE_PROVIDER_SITE_OTHER): Payer: Medicare (Managed Care) | Admitting: Cardiovascular Disease

## 2015-04-15 ENCOUNTER — Encounter: Payer: Self-pay | Admitting: Cardiovascular Disease

## 2015-04-15 VITALS — BP 138/83 | HR 72 | Resp 16 | Ht 72.0 in | Wt 355.0 lb

## 2015-04-15 DIAGNOSIS — I2510 Atherosclerotic heart disease of native coronary artery without angina pectoris: Secondary | ICD-10-CM

## 2015-04-15 DIAGNOSIS — I5033 Acute on chronic diastolic (congestive) heart failure: Secondary | ICD-10-CM | POA: Diagnosis not present

## 2015-04-15 DIAGNOSIS — I480 Paroxysmal atrial fibrillation: Secondary | ICD-10-CM

## 2015-04-15 DIAGNOSIS — G4733 Obstructive sleep apnea (adult) (pediatric): Secondary | ICD-10-CM

## 2015-04-15 DIAGNOSIS — Z79899 Other long term (current) drug therapy: Secondary | ICD-10-CM | POA: Diagnosis not present

## 2015-04-15 MED ORDER — METOLAZONE 5 MG PO TABS: 5.0000 mg | ORAL_TABLET | Freq: Every day | ORAL | Status: DC

## 2015-04-15 NOTE — Progress Notes (Signed)
Patient ID: [name], male   DOB: [birth year], 69 y.o.   MRN: [id number]     Cardiology Office Note   Date:  04/15/2015   ID:  [name], DOB 1946/06/25, MRN [id number]  PCP:  Sherian Maroon, MD  Cardiologist:   Sanda Klein, MD   Chief Complaint  Patient presents with  . [place]    Patient has felt SOB, has had chest pain/pressure, and has had swelling.      History of Present Illness: [name] is a 69 y.o. male who presents for pacemaker check (sinus node dysfunction), chronic diastolic heart failure, asymptomatic moderate coronary artery disease and a history of paroxysmal atrial fibrillation.   He continues to gain weight at a pace that must represent fluid retention. He complains of worsening dyspnea over the last 6 weeks, even though he is extremely sedentary. He has also had marked worsening of his lower extremity edema and increasing abdominal girth. He has gained 13 pounds since his last appointment in May and has gained a roughly 50-65 pounds over the last year or so.  Weight  January 2015  290 lb. March 2015  274 August 2015   301 September 2015 308 March 2016  342 August 2016  355  Pacemaker function is unchanged from previous visits. He has 97% atrial pacing and less than 4% ventricular pacing. His atrial lead parameters are excellent. He has chronically elevated ventricular pacing thresholds, but since ventricular pacing is rare he still has roughly 10 years of estimated generator longevity. Most recently tested ventricular pacing threshold was 2.25 V at 1 ms pulse width. The device is programmed at 4 V at 1 ms pulse width in the ventricle. He has not had any atrial fibrillation in well over a year. He had lengthy periods of persistent atrial fibrillation in June and October 2014. He is on chronic amiodarone therapy. No atrial fibrillation has been recorded in almost 2 years. He has a history of intracranial hemorrhage and is not  on anticoagulation.  He still complains of chest pain that occurs when he twists his upper body in a certain way, clearly musculoskeletal, probably cervical spine disease related. He does not have any symptoms that sound like angina.   In 2005 he had coronary angiography that showed moderate obstructive lesions in the LAD and left circumflex arteries. In 2011 he had a low risk nuclear perfusion study consistent with diaphragmatic attenuation artifact. Left ventricular systolic function is normal. He has a remote history of acute pulmonary edema felt to be secondary to diastolic dysfunction.   He has mild chronic lower extremity edema that is felt to be primarily secondary to peripheral venous insufficiency. He has gained a lot of weight since his last appointment.    He had multiple hospitalizations in 2015: in February, he presented with a left parenchymal temporoparietal hematoma and required intubation. He had a pacemaker lead associated thrombus and developed a right lower lobe pulmonary embolism. He received a tracheostomy and gastric feeding tube and was transferred to inpatient nursing facility where he developed severe sepsis secondary to urinary tract infection and pneumonia. He was admitted to [facility] for sepsis and respiratory failure. After this result he returned to rehabilitation. In July 2015 he returned home and he goes daily to [place] activities. He is legally blind and his daughter lives an hour away.  His history is notable for morbid obesity, insulin requiring diabetes mellitus, hypertension, obstructive sleep apnea on CPAP, mild valvular aortic  stenosis, mild coronary atherosclerosis treated medically, legal blindness, severe diabetic peripheral neuropathy complicated by poorly healing wounds, chronic venous insufficiency and a dual-chamber permanent pacemaker that was implanted in 19-Nov-2003 for sinus node dysfunction.   Past Medical History  Diagnosis Date  .  Diabetes type 2, uncontrolled   . Gout   . Hyperlipidemia   . Hypertension   . Obstructive sleep apnea     On CPAP. Last sleep study (06/2009) - Severe obstructive sleep apnea/hypopnea syndrome, apnea-hypopnea index 70.5 per hour with non positional events moderately loud snoring and oxygen desaturation to a nadir of 85% on room air.  . Benign prostatic hypertrophy   . Morbid obesity   . Cardiomyopathy, hypertensive      diastolic dysfunction by [id number] echo  . Coronary artery disease      nonobstructive. 2 vessel. cardiac catheter in 11-19-03 showing 60% LAD occlusion, 30% circumflex occlusion.  . Pacemaker 11-19-2003     secondary to bradycardia  . Legally blind   . Herpes   . Occult blood positive stool      History of in August 2007.  colonoscopy in January 2008 showing normal colon with fair inadequate prep. By Dr. Silvano Rusk.  . Incarcerated ventral hernia     H/O. S/P repair ventral hernia repair 07/2005.  . Diabetic ulcer of thigh     H/O Rt thigh ulcer.  . Diabetic peripheral neuropathy   . Exertional shortness of breath     "comes and goes" (07-05-2013)  . GERD (gastroesophageal reflux disease)   . Degenerative joint disease      Right knee.  . Arthritis     "all over" (07-05-13)  . Sepsis 08/26/2013  . Atrial fibrillation     not Candidate for Coumadin d/t rectal bleeding  . Hypotension 09/05/2013  . LBBB (left bundle branch block)     Past Surgical History  Procedure Laterality Date  . Pacemaker lead revision  12/2003     likely microdislodgment  of right ventricular lead  . Ventral hernia repair  07/2005     S/P laparotomy, lysis of adhesions, repair of incarcerated ventral hernia with  partial omentectomy.. Performed by Dr. Marlou Starks.  . Hernia repair    . Cataract extraction, bilateral Bilateral   . Refractive surgery Bilateral   . Eye surgery    . 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 Amputation Left 06/22/2013     Procedure: AMPUTATION FOOT;  Surgeon: Newt Minion, MD;  Location: [facility];  Service: Orthopedics;  Laterality: Left;  Left Midfoot Amputation  . Insert / replace / remove pacemaker  November 19, 2003    Secondary to symptomatic bradycardia. DDDR pacemaker. By Dr. Rollene Fare.  . Insert / replace / remove pacemaker  18-Nov-2012    "old pacemaker died; had new one put in" (July 05, 2013)  . Esophagogastroduodenoscopy N/A 10/12/2013    Procedure: ESOPHAGOGASTRODUODENOSCOPY (EGD);  Surgeon: Gwenyth Ober, MD;  Location: [facility];  Service: General;  Laterality: N/A;  bedside  . Peg placement N/A 10/12/2013    Procedure: PERCUTANEOUS ENDOSCOPIC GASTROSTOMY (PEG) PLACEMENT;  Surgeon: Gwenyth Ober, MD;  Location: [facility];  Service: General;  Laterality: N/A;  . Venous doppler  01/17/2013    No evidence of thrombus or thrombophlebitis. Left GSVs appear enlarged and demonstrate valvular insuffiiciency. Bilateral SSVs appeared patent with no valvular insufficiency seen.  . Cardiac catheterization  10/22/2003    Dilatation of the LV with preserved LV systolic function. Tortous  aorta, but no AAA. Moderate disease with 50-60% area of narrowing in the circumflex after the second OM and 70% mid narrowing in the LAD. No intervention.  . Cardiovascular stress test  01/23/2010    Perfusion defect seen in inferior myocardial region is consistent with diaphragmatic attenuation. Remaining myocardium demonstrates normal myocardial perfusion with no evidence of ischemia or infarct. Stress ECG was non-diagnostic due to LBBB.  . Transthoracic echocardiogram  01/01/2013    EF 50-55%, ventricular septum-thickness moderately increased  . Permanent pacemaker generator change N/A 11/03/2012    Procedure: PERMANENT PACEMAKER GENERATOR CHANGE;  Surgeon: Sanda Klein, MD;  Location: [facility];  Service: Cardiovascular;  Laterality: N/A;     Current Outpatient Prescriptions  Medication Sig Dispense Refill  . acetaminophen (TYLENOL) 325 MG  tablet Take 2 tablets (650 mg total) by mouth every 6 (six) hours as needed for fever (>99.5).    Marland Kitchen Amino Acids-Protein Hydrolys (FEEDING SUPPLEMENT, PRO-STAT SUGAR FREE 64,) LIQD Place 30 mLs into feeding tube 2 (two) times daily. 900 mL 0  . amiodarone (PACERONE) 200 MG tablet Take 200 mg by mouth daily.    Marland Kitchen antiseptic oral rinse (BIOTENE) LIQD 15 mLs by Mouth Rinse route QID.    Marland Kitchen aspirin 81 MG chewable tablet Place 1 tablet (81 mg total) into feeding tube daily.    Marland Kitchen atorvastatin (LIPITOR) 10 MG tablet Place 1 tablet (10 mg total) into feeding tube daily at 6 PM. (Patient taking differently: Place 40 mg into feeding tube daily at 6 PM. )    . bisacodyl (DULCOLAX) 10 MG suppository Place 1 suppository (10 mg total) rectally daily as needed for moderate constipation. 12 suppository 0  . brimonidine (ALPHAGAN) 0.2 % ophthalmic solution Place 1 drop into both eyes 3 (three) times daily. 5 mL 12  . carvedilol (COREG) 25 MG tablet Take 12.5 mg by mouth 2 (two) times daily with a meal.     . chlorhexidine (PERIDEX) 0.12 % solution 15 mLs by Mouth Rinse route 2 (two) times daily. 120 mL 0  . furosemide (LASIX) 40 MG tablet Take 80 mg by mouth 2 (two) times daily.    Marland Kitchen gabapentin (NEURONTIN) 100 MG capsule Take 300 mg by mouth 2 (two) times daily.    . Insulin Detemir (LEVEMIR [place]) Inject 40 Units into the skin 2 (two) times daily.    Marland Kitchen lidocaine (LIDODERM) 5 % Place onto the skin daily.    . Liraglutide 18 MG/3ML SOPN Inject 1.2 mg into the skin daily.    . methylcellulose (ARTIFICIAL TEARS) 1 % ophthalmic solution Place 1 drop into both eyes 2 (two) times daily.    . Nutritional Supplements (FEEDING SUPPLEMENT, VITAL HIGH PROTEIN,) LIQD liquid Place 1,000 mLs into feeding tube continuous.    . pantoprazole sodium (PROTONIX) 40 mg/20 mL PACK Place 20 mLs (40 mg total) into feeding tube at bedtime. 30 each   . pneumococcal 23 valent vaccine (PNU-IMMUNE) 25 MCG/0.5ML injection Inject 0.5 mLs into the  muscle tomorrow at 10 am. 2.5 mL   . valsartan (DIOVAN) 160 MG tablet Take 160 mg by mouth daily.    . metolazone (ZAROXOLYN) 5 MG tablet Take 1 tablet (5 mg total) by mouth daily. 60 tablet 3   No current facility-administered medications for this visit.    Allergies:   Review of patient's allergies indicates no known allergies.    Social History:  The patient  reports that he quit smoking about 11 years ago. His smoking use  included Cigarettes. He smoked 0.00 packs per day for 0 years. He has never used smokeless tobacco. He reports that he drinks alcohol. He reports that he does not use illicit drugs.   Family History:  The patient's family history includes Allergies in his sister; Asthma in his brother; Coronary artery disease in his sister; Heart disease in his sister; Rheum arthritis in his brother.    ROS:  Please see the history of present illness.    Otherwise, review of systems positive for exertional dyspnea with minimal activity, chest tightness that occurs with motion of upper body, not with walking, severely increased lower extremity edema, frequent nocturia, but denies orthopnea or paroxysmal atrial dyspnea..   All other systems are reviewed and negative.    PHYSICAL EXAM: VS:  BP 138/83 mmHg  Pulse 72  Resp 16  Ht 6' (1.829 m)  Wt 355 lb (161.027 kg)  BMI 48.14 kg/m2 , BMI Body mass index is 48.14 kg/(m^2).  General: Alert, oriented x3, no distress Head: no evidence of trauma, PERRL, EOMI, no exophtalmos or lid lag, no myxedema, no xanthelasma; normal ears, nose and oropharynx Neck: Hard to see his jugular venous pulsations and no hepatojugular reflux; brisk carotid pulses without delay and no carotid bruits Chest: clear to auscultation, no signs of consolidation by percussion or palpation, normal fremitus, symmetrical and full respiratory excursions, healthy left subclavian pacemaker site Cardiovascular: Unable to identify the apical impulse, regular rhythm, normal  first and second heart sounds, no murmurs, rubs or gallops Abdomen: no tenderness or distention, no masses by palpation, no abnormal pulsatility or arterial bruits, normal bowel sounds, no hepatosplenomegaly Extremities: no clubbing, cyanosis or edema; 2+ radial, ulnar and brachial pulses bilaterally; 2+ right femoral, posterior tibial and dorsalis pedis pulses; 2+ left femoral, posterior tibial and dorsalis pedis pulses; no subclavian or femoral bruits Neurological: grossly nonfocal Psych: euthymic mood, full affect   EKG:  EKG is ordered today. The ekg ordered today demonstrates atrial paced, ventricular sensed, longer AV delay 319 ms, QS pattern in leads V1-V3   Recent Labs: March 2016 glucose 404, creatinine 1.67, normal liver function tests, potassium 4.1, TSH 1.83   Lipid Panel    Component Value Date/Time   CHOL 134 10/05/2013 0500   TRIG 111 10/29/2013 0611   HDL 37* 10/05/2013 0500   CHOLHDL 3.6 10/05/2013 0500   VLDL 30 10/05/2013 0500   LDLCALC 67 10/05/2013 0500  March 2016 total cholesterol 192, triglycerides 150, HDL 47, LDL 115.    Wt Readings from Last 3 Encounters:  04/15/15 355 lb (161.027 kg)  10/29/14 341 lb 14.4 oz (155.085 kg)  05/14/14 308 lb 3.2 oz (139.799 kg)    .   ASSESSMENT AND PLAN:  1. Acute on chronic diastolic heart failure with evidence of massive hypervolemia, possibly as much as 60 pounds fluid excess. As there is concern regarding worsening kidney function will have to proceed with diuresis at a slow steady pace. Add metolazone 5 mg only 3 days a week, Monday Wednesday and Friday. Reevaluate labs in about 2 weeks.  2. Sinus node dysfunction Dual-chamber permanent pacemaker with normal function of the atrial lead and high ventricular pacing thresholds. Since ventricular pacing is almost never needed, no plan for lead revision at this time. Due to his blindness he has not been able to perform remote pacemaker downloads. Will continue with  office checks every 3-6 months.  3. History of atrial fibrillation, persistent  Fortunately, he is now maintaining normal rhythm on  amiodarone. Last known episode of atrial fibrillation October 2014 Unfortunately, he cannot be anticoagulated since has had intracranial bleeding. He is at high risk of cardioembolic events due to multiple comorbidities of hypertension, diabetes mellitus, congestive heart failure (CHADSVasc 4).  While on amiodarone needs periodic review of liver function tests and thyroid function tests.   4. Coronary artery disease  Currently asymptomatic. Coronary angiography in 2005 showed moderate LAD and circumflex obstruction. Low risk nuclear perfusion scan in 2011. Current LDL cholesterol level is high. Recommend increasing atorvastatin to 40 mg daily. Target LDL less than 70.  5. Cardiomyopathy, hypertensive  No clinical evidence of hypervolemia. Functional class is not assessed due to orthopedic problems   6. CKD (chronic kidney disease) stage 3, GFR 30-59 ml/min  Volatile renal function last year due to multiple illnesses, but most recent labs from March 2016 show creatinine of 1.67, estimated GFR just under 50.. May still benefit from ACE inhibitor therapy to prevent progression of diabetic nephropathy.   7. Hypertension  Target BP less than 130/80 mm Hg. Restart ACE inhibitor if renal function permits based on current labs.  8. Mild aortic stenosis Mean gradient 18 mm Hg, not yet clinically significant  9. History of pulmonary embolism, likely secondary to pacemaker lead associated thrombus  10. History of intracranial hemorrhage (parenchymal hematoma)  11. 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  The patient does not have concerns regarding medicines.   Labs/ tests ordered today include:  Orders Placed This Encounter  Procedures  . Comprehensive metabolic panel  . TSH  . EKG  12-Lead    Patient Instructions  Medication Instructions:   START ZAROXOLYN 5MG   - TAKE 30 MINTUES BEFORE FUROSEMIDE ON MONDAYS/WEDNESDAYS/FRIDAYS  Labwork:  2 WEEKS AT [facility]    Follow-Up:  3 MONTHS WITH PACEMAKER CHECK ([facility]TRONIC-GRAY)           Mikael Spray, MD  04/15/2015 2:00 PM    Sanda Klein, MD, [facility] [phone] office [phone] pager

## 2015-04-15 NOTE — Patient Instructions (Addendum)
Medication Instructions:   START ZAROXOLYN 5MG   - TAKE 30 MINTUES BEFORE FUROSEMIDE ON MONDAYS/WEDNESDAYS/FRIDAYS  Labwork:  2 WEEKS AT [facility]    Follow-Up:  3 MONTHS WITH PACEMAKER CHECK (MEDTRONIC-GRAY)

## 2015-04-16 ENCOUNTER — Telehealth: Payer: Self-pay | Admitting: *Deleted

## 2015-04-16 LAB — CUP PACEART INCLINIC DEVICE CHECK
Battery Impedance: 178 Ohm
Battery Remaining Longevity: 118 mo
Battery Voltage: 2.78 V
Brady Statistic AP VP Percent: 3 %
Brady Statistic AS VP Percent: 0 %
Date Time Interrogation Session: 20160830114445
Lead Channel Impedance Value: 397 Ohm
Lead Channel Impedance Value: 732 Ohm
Lead Channel Pacing Threshold Pulse Width: 0.4 ms
Lead Channel Setting Pacing Amplitude: 1.5 V
Lead Channel Setting Pacing Amplitude: 4 V
Lead Channel Setting Pacing Pulse Width: 1 ms
Lead Channel Setting Sensing Sensitivity: 5.6 mV
MDC IDC MSMT LEADCHNL RA PACING THRESHOLD AMPLITUDE: 0.625 V
MDC IDC MSMT LEADCHNL RV SENSING INTR AMPL: 11.2 mV
MDC IDC STAT BRADY AP VS PERCENT: 94 %
MDC IDC STAT BRADY AS VS PERCENT: 3 %

## 2015-04-16 MED ORDER — ATORVASTATIN CALCIUM 40 MG PO TABS: 40.0000 mg | ORAL_TABLET | Freq: Every day | ORAL | Status: DC

## 2015-04-16 NOTE — Telephone Encounter (Signed)
-----   Message from Sanda Klein, MD sent at 04/15/2015  2:16 PM EDT ----- We need to increase his atorvastatin to 40 mg daily, please

## 2015-04-16 NOTE — Telephone Encounter (Signed)
Will call in Atorvastatin 40mg  daily to [facility].  Patient is blind and unable to tell me what strength he is taking (there is a note that looks like he might already be on 40mg ).  Spoke with Seth [facility] at [facility] and he is already taking 40mg  daily.

## 2015-04-29 ENCOUNTER — Encounter: Payer: Self-pay | Admitting: Cardiovascular Disease

## 2015-04-30 ENCOUNTER — Encounter: Payer: Medicare (Managed Care) | Admitting: Cardiovascular Disease

## 2015-05-01 ENCOUNTER — Encounter: Payer: Self-pay | Admitting: Cardiovascular Disease

## 2015-05-21 ENCOUNTER — Ambulatory Visit
Admission: RE | Admit: 2015-05-21 | Discharge: 2015-05-21 | Disposition: A | Discharge status: Home / Self Care | Payer: No Typology Code available for payment source | Source: Ambulatory Visit | Attending: Family Medicine | Admitting: Family Medicine

## 2015-05-21 ENCOUNTER — Other Ambulatory Visit: Payer: Self-pay | Admitting: Family Medicine

## 2015-05-21 DIAGNOSIS — R0602 Shortness of breath: Secondary | ICD-10-CM

## 2015-07-02 IMAGING — US US RENAL
1 series · 14 of 25 positions shown · non-contrast
Comparison: None.

CLINICAL DATA: History of pain.  Urinary tract evaluation.

RENAL/URINARY TRACT ULTRASOUND COMPLETE

[Series 1: us renal · 0.32mm/px · 14 of 40 slices shown]
[im 1/40]
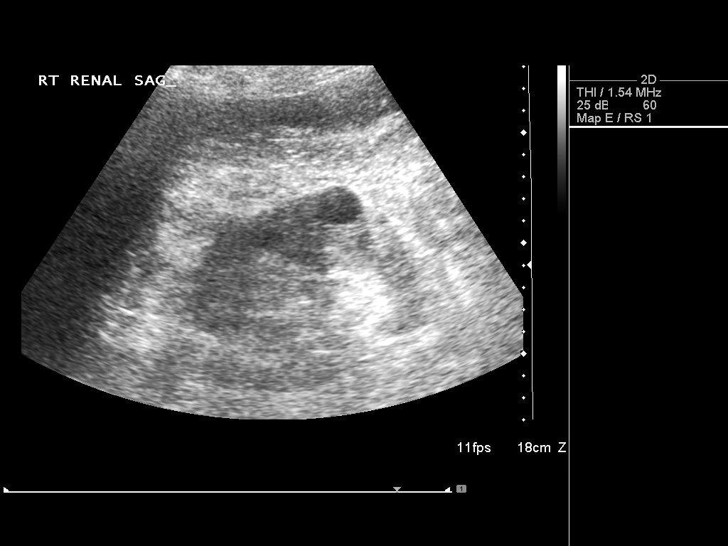
[im 4/40]
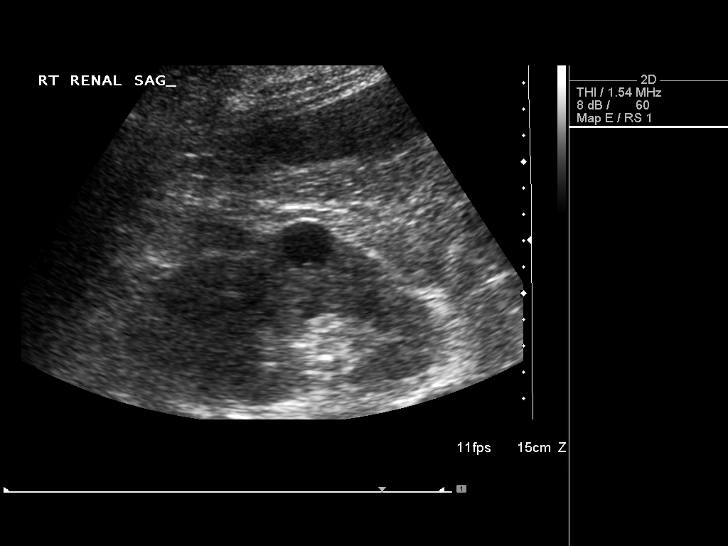
[im 7/40]
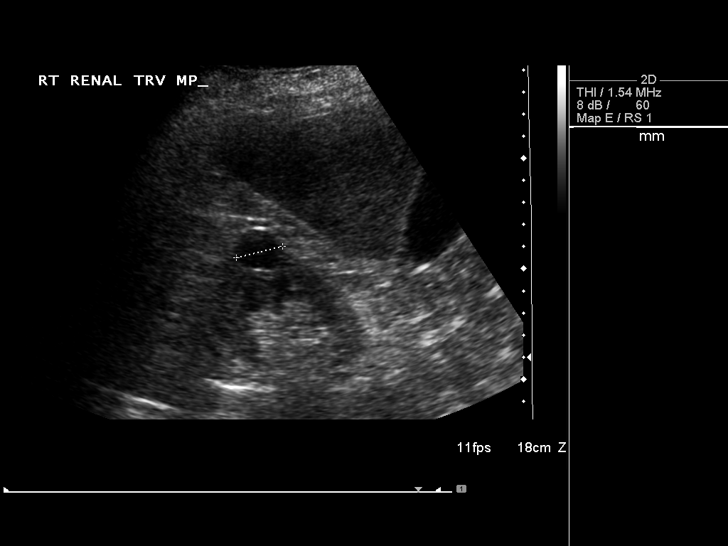
[im 10/40]
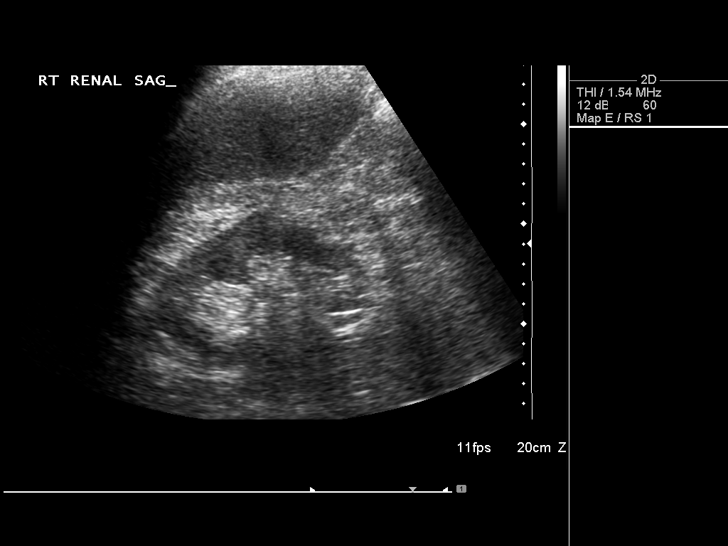
[im 14/40]
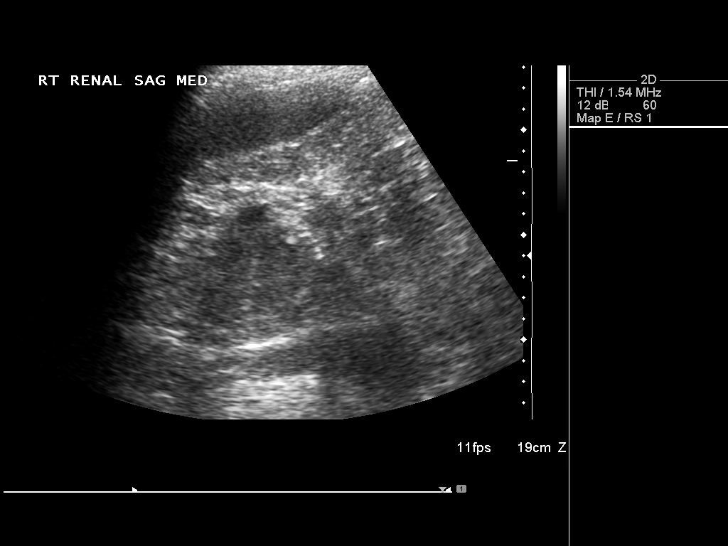
[im 15/40]
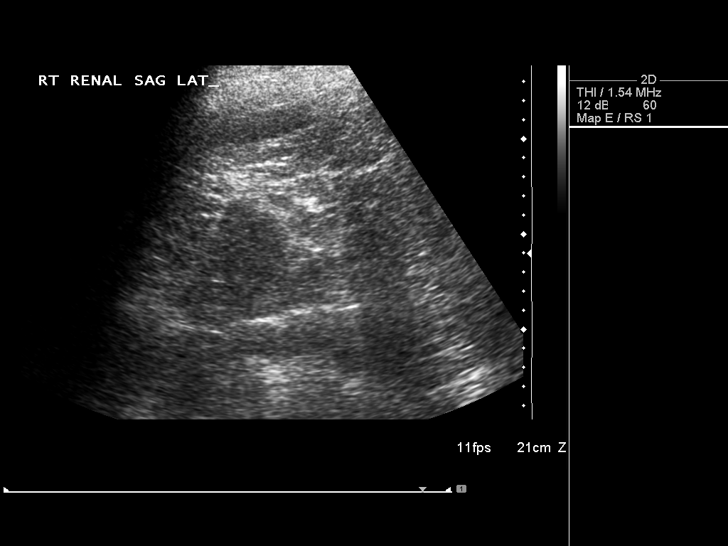
[im 18/40]
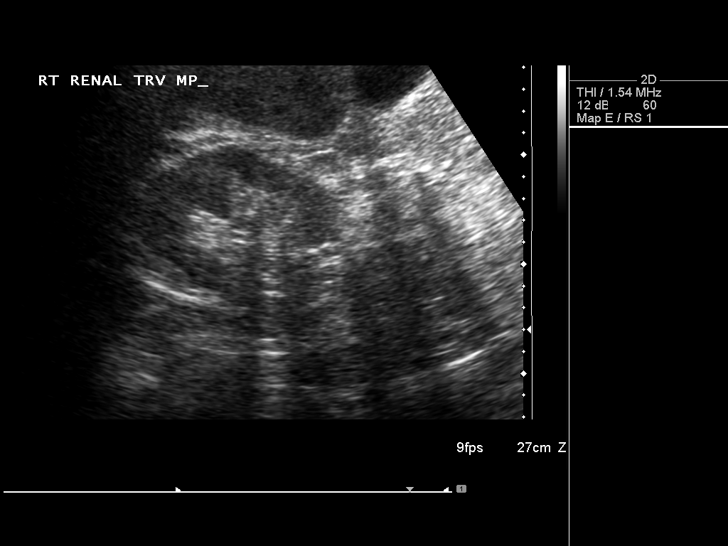
[im 22/40]
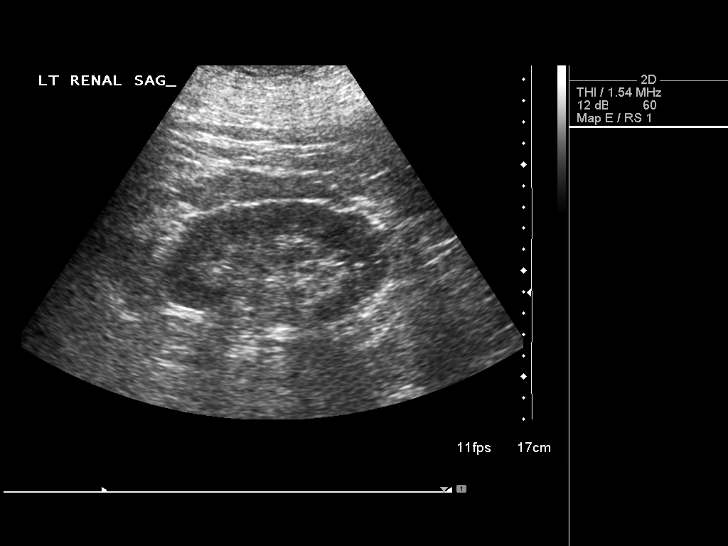
[im 25/40]
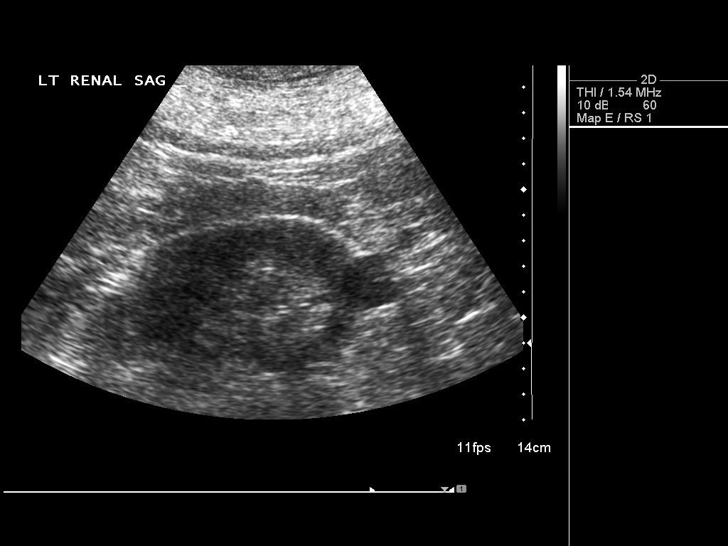
[im 27/40]
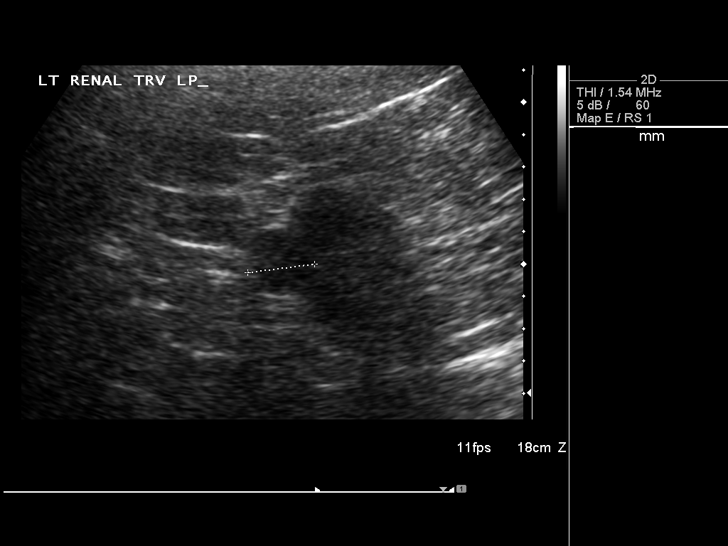
[im 30/40]
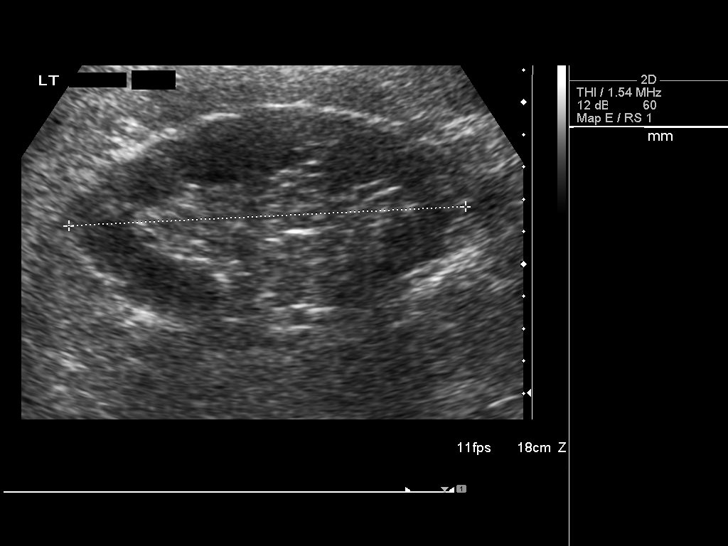
[im 33/40]
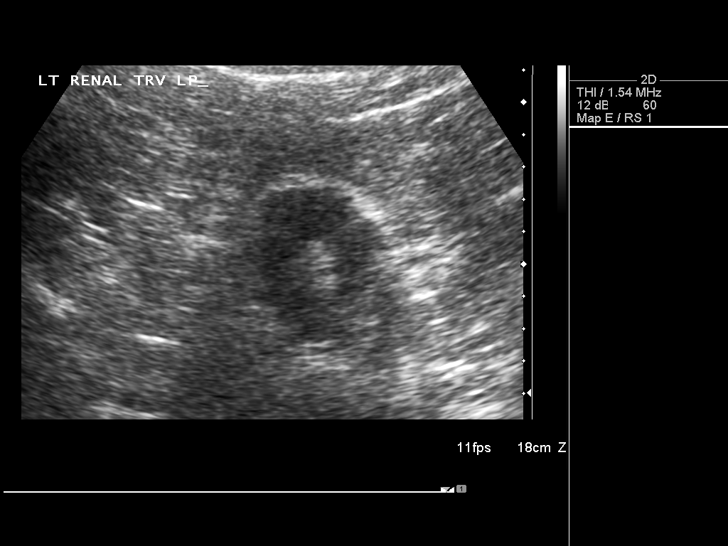
[im 36/40]
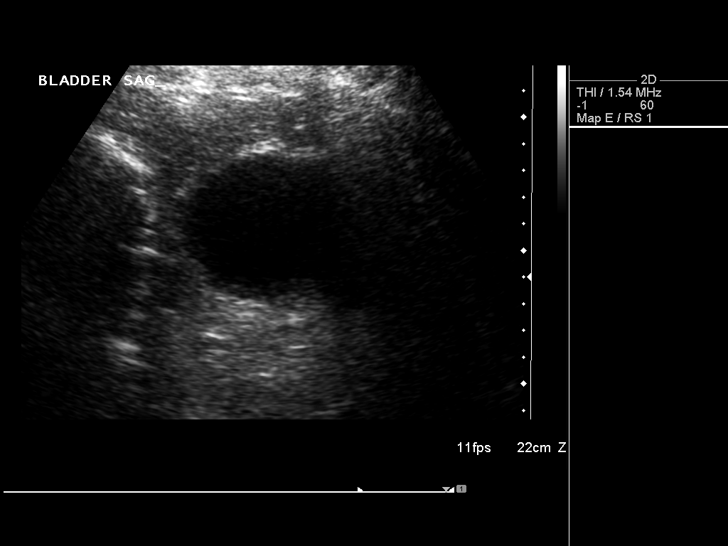
[im 40/40]
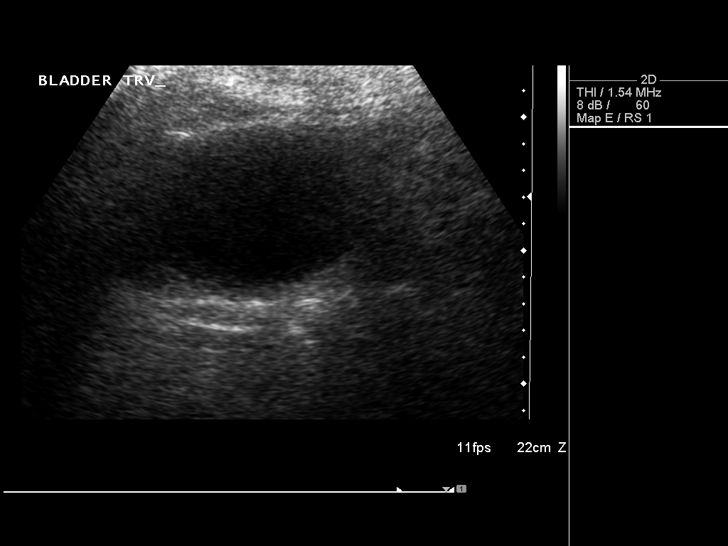

[14 of 25 positions shown; findings below may reference images not displayed]

FINDINGS: Right Kidney:  Right renal length is 12.4 cm.  There is a small
cyst involving the midportion of the right kidney.  The cyst
measures 2.2 by 2.2 x 2.0 cm.  Cyst wall is thinned.  A few low
intensity internal echoes most likely reflect noise but could
reflect a tiny amount of debris or noise.  No solid papillary mass
was evident.

Left Kidney:  Left renal length measured 12.2 cm.  A hypoechoic
mass with posterior acoustic enhancement consistent with cyst is
seen in the lower pole.  The cyst measures 2.2 x 2.1 x 2.0 cm.
Some low intensity internal echoes may reflect debris or noise.  No
solid nodular or papillary mass was evident.

Examination of each kidney shows no evidence of hydronephrosis,
solid  mass, calculus, parenchymal loss, or parenchymal textural
abnormality.

Bladder:  No urinary bladder abnormalities were evident.
IMPRESSION: Normal sized kidneys.  No hydronephrosis evident.  Renal cysts.  No
urinary bladder abnormality identified.

## 2015-07-04 ENCOUNTER — Encounter: Payer: Self-pay | Admitting: Cardiovascular Disease

## 2015-07-08 ENCOUNTER — Ambulatory Visit (INDEPENDENT_AMBULATORY_CARE_PROVIDER_SITE_OTHER): Payer: Medicare (Managed Care) | Admitting: Cardiovascular Disease

## 2015-07-08 ENCOUNTER — Encounter: Payer: Self-pay | Admitting: Cardiovascular Disease

## 2015-07-08 VITALS — BP 122/79 | HR 89 | Ht 72.0 in | Wt 360.0 lb

## 2015-07-08 DIAGNOSIS — I10 Essential (primary) hypertension: Secondary | ICD-10-CM | POA: Diagnosis not present

## 2015-07-08 DIAGNOSIS — I480 Paroxysmal atrial fibrillation: Secondary | ICD-10-CM

## 2015-07-08 DIAGNOSIS — Z79899 Other long term (current) drug therapy: Secondary | ICD-10-CM | POA: Diagnosis not present

## 2015-07-08 DIAGNOSIS — Z950 Presence of cardiac pacemaker: Secondary | ICD-10-CM

## 2015-07-08 DIAGNOSIS — I43 Cardiomyopathy in diseases classified elsewhere: Secondary | ICD-10-CM

## 2015-07-08 DIAGNOSIS — I5033 Acute on chronic diastolic (congestive) heart failure: Secondary | ICD-10-CM

## 2015-07-08 DIAGNOSIS — I110 Hypertensive heart disease with heart failure: Secondary | ICD-10-CM

## 2015-07-08 DIAGNOSIS — I513 Intracardiac thrombosis, not elsewhere classified: Secondary | ICD-10-CM

## 2015-07-08 DIAGNOSIS — I213 ST elevation (STEMI) myocardial infarction of unspecified site: Secondary | ICD-10-CM

## 2015-07-08 DIAGNOSIS — E785 Hyperlipidemia, unspecified: Secondary | ICD-10-CM

## 2015-07-08 DIAGNOSIS — G4733 Obstructive sleep apnea (adult) (pediatric): Secondary | ICD-10-CM

## 2015-07-08 DIAGNOSIS — I5032 Chronic diastolic (congestive) heart failure: Secondary | ICD-10-CM | POA: Diagnosis not present

## 2015-07-08 MED ORDER — TORSEMIDE 100 MG PO TABS: 100.0000 mg | ORAL_TABLET | Freq: Two times a day (BID) | ORAL | Status: DC

## 2015-07-08 NOTE — Progress Notes (Signed)
Patient ID: [name], male   DOB: 06/24/1946, 69 y.o.   MRN: [id number]     Cardiology Office Note   Date:  07/09/2015   ID:  [name], DOB [birth year], MRN [id number]  PCP:  Sherian Maroon, MD  Cardiologist:   Sanda Klein, MD   Chief Complaint  Patient presents with  . ROV 3 months    patient report shortness of breath, congestion, "fluid in mouth and throat," blisters "bubbles" on left leg - sores come up on left leg      History of Present Illness: [name] is a 69 y.o. male who presents for acute on chronic diastolic heart failure, right heart failure related to morbid obesity and obstructive sleep apnea, hypertensive cardiomyopathy, nonobstructive coronary artery disease, symptomatic sinus bradycardia status post pacemaker implantation, history of paroxysmal atrial fibrillation on chronic amiodarone, systemic hypertension.  Since his last appointment his edema has worsened slightly. It improves saw him after overnight recumbent position but never completely goes away. He has gained a few more pounds which probably represent fluid. He has functional class III dyspnea on exertion and 3+ bilateral edema on most days. He has significant nocturia but the symptoms suggest prostatism as well as heart failure.  Interrogation of his dual chamber Medtronic Adapta device that was implanted 2 years ago still shows 10 years of estimated generator longevity. There is 97.3% atrial pacing and only 3.6% ventricular pacing. He has chronically high ventricular pacing thresholds, this has not interfered significantly with device function or longevity since he almost never requires ventricular pacing. No atrial fibrillation has been recorded since his last device check. Heart rate histogram distribution is very blunted, but he is extremely sedentary. The presenting rhythm was atrial paced ventricular sensed with very rare ventricular paced beats  He is already on a pretty high  dose of furosemide as well as daily metolazone. His most recent creatinine was 2.2, potassium 3.8.  He weighs 70 pounds more than he did 18 months ago.  He has severe macular degeneration and is legally blind. His medications are set out by a nurse's aide on a weekly basis.   He had multiple hospitalizations in 2015: in February, he presented with a left parenchymal temporoparietal hematoma and required intubation. He had a pacemaker lead associated thrombus and developed a right lower lobe pulmonary embolism. He received a tracheostomy and gastric feeding tube and was transferred to inpatient nursing facility where he developed severe sepsis secondary to urinary tract infection and pneumonia. He was admitted to [facility] for sepsis and respiratory failure. After this result he returned to rehabilitation. In July 2015 he returned home and he goes daily to [place] activities. He is legally blind and his daughter lives an hour away.  His history is notable for morbid obesity, insulin requiring diabetes mellitus, hypertension, obstructive sleep apnea on CPAP, mild valvular aortic stenosis, mild coronary atherosclerosis treated medically, legal blindness, severe diabetic peripheral neuropathy complicated by poorly healing wounds, chronic venous insufficiency and a dual-chamber permanent pacemaker that was implanted in 2005 for sinus node dysfunction.  Past Medical History  Diagnosis Date  . Diabetes type 2, uncontrolled ([place])   . Gout   . Hyperlipidemia   . Hypertension   . Obstructive sleep apnea     On CPAP. Last sleep study (06/2009) - Severe obstructive sleep apnea/hypopnea syndrome, apnea-hypopnea index 70.5 per hour with non positional events moderately loud snoring and oxygen desaturation to a nadir of 85% on room air.  . Benign  prostatic hypertrophy   . Morbid obesity ([name])   . Cardiomyopathy, hypertensive ([place])      diastolic dysfunction by [id number] echo  . Coronary artery  disease      nonobstructive. 2 vessel. cardiac catheter in 11-13-2003 showing 60% LAD occlusion, 30% circumflex occlusion.  . Pacemaker 2003/11/13     secondary to bradycardia  . Legally blind   . Herpes   . Occult blood positive stool      History of in August 2007.  colonoscopy in January 2008 showing normal colon with fair inadequate prep. By Dr. Silvano Rusk.  . Incarcerated ventral hernia     H/O. S/P repair ventral hernia repair 07/2005.  . Diabetic ulcer of thigh ([name])     H/O Rt thigh ulcer.  . Diabetic peripheral neuropathy ([name])   . Exertional shortness of breath     "comes and goes" (2013/06/29)  . GERD (gastroesophageal reflux disease)   . Degenerative joint disease      Right knee.  . Arthritis     "all over" (29-Jun-2013)  . Sepsis ([place]) 08/26/2013  . Atrial fibrillation ([place])     not Candidate for Coumadin d/t rectal bleeding  . Hypotension 09/05/2013  . LBBB (left bundle branch block)     Past Surgical History  Procedure Laterality Date  . Pacemaker lead revision  12/2003     likely microdislodgment  of right ventricular lead  . Ventral hernia repair  07/2005     S/P laparotomy, lysis of adhesions, repair of incarcerated ventral hernia with  partial omentectomy.. Performed by Dr. Marlou Starks.  . Hernia repair    . Cataract extraction, bilateral Bilateral   . Refractive surgery Bilateral   . Eye surgery    . 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 Amputation Left 06/22/2013    Procedure: AMPUTATION FOOT;  Surgeon: Newt Minion, MD;  Location: [facility];  Service: Orthopedics;  Laterality: Left;  Left Midfoot Amputation  . Insert / replace / remove pacemaker  11/13/03    Secondary to symptomatic bradycardia. DDDR pacemaker. By Dr. Rollene Fare.  . Insert / replace / remove pacemaker  11-12-2012    "old pacemaker died; had new one put in" (06/29/13)  . Esophagogastroduodenoscopy N/A 10/12/2013    Procedure: ESOPHAGOGASTRODUODENOSCOPY (EGD);   Surgeon: Gwenyth Ober, MD;  Location: [facility];  Service: General;  Laterality: N/A;  bedside  . Peg placement N/A 10/12/2013    Procedure: PERCUTANEOUS ENDOSCOPIC GASTROSTOMY (PEG) PLACEMENT;  Surgeon: Gwenyth Ober, MD;  Location: [facility];  Service: General;  Laterality: N/A;  . Venous doppler  01/17/2013    No evidence of thrombus or thrombophlebitis. Left GSVs appear enlarged and demonstrate valvular insuffiiciency. Bilateral SSVs appeared patent with no valvular insufficiency seen.  . Cardiac catheterization  10/22/2003    Dilatation of the LV with preserved LV systolic function. Tortous aorta, but no AAA. Moderate disease with 50-60% area of narrowing in the circumflex after the second OM and 70% mid narrowing in the LAD. No intervention.  . Cardiovascular stress test  01/23/2010    Perfusion defect seen in inferior myocardial region is consistent with diaphragmatic attenuation. Remaining myocardium demonstrates normal myocardial perfusion with no evidence of ischemia or infarct. Stress ECG was non-diagnostic due to LBBB.  . Transthoracic echocardiogram  01/01/2013    EF 50-55%, ventricular septum-thickness moderately increased  . Permanent pacemaker generator change N/A 11/03/2012    Procedure: PERMANENT PACEMAKER GENERATOR CHANGE;  Surgeon:  Sanda Klein, MD;  Location: [facility] CATH [facility];  Service: Cardiovascular;  Laterality: N/A;     Current Outpatient Prescriptions  Medication Sig Dispense Refill  . albuterol (PROVENTIL HFA;VENTOLIN HFA) 108 (90 BASE) MCG/ACT inhaler Inhale 2 puffs into the lungs every 6 (six) hours as needed for wheezing or shortness of breath.    . allopurinol (ZYLOPRIM) 100 MG tablet Take 100 mg by mouth daily.    Marland Kitchen amiodarone (PACERONE) 200 MG tablet Take 200 mg by mouth daily.    Marland Kitchen amLODipine (NORVASC) 10 MG tablet Take 10 mg by mouth daily.    Marland Kitchen aspirin 325 MG tablet Take 325 mg by mouth daily.    Marland Kitchen atorvastatin (LIPITOR) 40 MG tablet Take 1 tablet (40 mg total)  by mouth daily. 90 tablet 3  . brimonidine (ALPHAGAN) 0.2 % ophthalmic solution Place 1 drop into both eyes 3 (three) times daily. 5 mL 12  . Cholecalciferol (VITAMIN D-3) 1000 UNITS CAPS Take 1 capsule by mouth daily.    Marland Kitchen gabapentin (NEURONTIN) 300 MG capsule Take 300 mg by mouth 2 (two) times daily.    . hydrALAZINE (APRESOLINE) 25 MG tablet Take 25 mg by mouth 3 (three) times daily.    . insulin detemir (LEVEMIR) 100 UNIT/ML injection Inject 60 Units into the skin 2 (two) times daily.    Marland Kitchen lidocaine (LIDODERM) 5 % Place onto the skin daily.    . Melatonin 3 MG CAPS Take 1 capsule by mouth at bedtime as needed.    . Menthol, Topical Analgesic, (BIOFREEZE EX) Apply 1 application topically daily as needed.    . methylcellulose (ARTIFICIAL TEARS) 1 % ophthalmic solution Place 1 drop into both eyes 2 (two) times daily.    . metolazone (ZAROXOLYN) 5 MG tablet Take 1 tablet (5 mg total) by mouth daily. 60 tablet 3  . metolazone (ZAROXOLYN) 5 MG tablet Take 5 mg by mouth daily as needed.    . metoprolol succinate (TOPROL-XL) 100 MG 24 hr tablet Take 100 mg by mouth daily. Take with or immediately following a meal.    . pantoprazole (PROTONIX) 40 MG tablet Take 40 mg by mouth daily.    . sennosides-docusate sodium (SENOKOT-S) 8.6-50 MG tablet Take 1 tablet by mouth daily as needed for constipation.    . Skin Protectants, Misc. (EUCERIN) cream Apply 1 application topically 2 (two) times daily.    . valsartan (DIOVAN) 160 MG tablet Take 160 mg by mouth daily.    Marland Kitchen torsemide (DEMADEX) 100 MG tablet Take 1 tablet (100 mg total) by mouth 2 (two) times daily. 60 tablet 6   No current facility-administered medications for this visit.    Allergies:   Review of patient's allergies indicates no known allergies.    Social History:  The patient  reports that he quit smoking about 11 years ago. His smoking use included Cigarettes. He smoked 0.00 packs per day for 0 years. He has never used smokeless tobacco.  He reports that he drinks alcohol. He reports that he does not use illicit drugs.   Family History:  The patient's family history includes Allergies in his sister; Asthma in his brother; Coronary artery disease in his sister; Heart disease in his sister; Rheum arthritis in his brother.    ROS:  Please see the history of present illness.    Otherwise, review of systems positive for none.   All other systems are reviewed and negative.    PHYSICAL EXAM: VS:  BP 122/79 mmHg  Pulse  89  Ht 6' (1.829 m)  Wt 360 lb (163.295 kg)  BMI 48.81 kg/m2 , BMI Body mass index is 48.81 kg/(m^2).  General: Alert, oriented x3, no distress; morbid obesity interferes with the physical exam Head: no evidence of trauma, PERRL, EOMI, no exophtalmos or lid lag, no myxedema, no xanthelasma; normal ears, nose and oropharynx Neck: Unable to see jugular venous pulsations; brisk carotid pulses without delay and no carotid bruits Chest: clear to auscultation, no signs of consolidation by percussion or palpation, normal fremitus, symmetrical and full respiratory excursions Cardiovascular: Unable to locate the apical impulse, regular rhythm, normal first and second heart sounds, early peaking 1/6 aortic ejection murmur, no diastolic murmurs, rubs or gallops Abdomen: no tenderness or distention, no masses by palpation, no abnormal pulsatility or arterial bruits, normal bowel sounds, no hepatosplenomegaly Extremities: no clubbing, cyanosis; chronic brawny edema of the ankles and calves bilaterally, 3+; 2+ radial, ulnar and brachial pulses bilaterally; unable to palpate the lower extremity pulses; no subclavian or femoral bruits Neurological: grossly nonfocal Psych: euthymic mood, full affect   EKG:  EKG is ordered today. The ekg ordered today demonstrates atrial paced ventricular sensed with very rare ventricular paced beats, QS pattern in leads V1-V2, QTC 429 ms, nonspecific ST-T changes, not much change from previous  tracings   Recent Labs: November 15 BUN 53, creatinine 2.24, glucose 127, potassium 3.8, sodium 142  Lipid Panel    Component Value Date/Time   CHOL 134 10/05/2013 0500   TRIG 111 10/29/2013 0611   HDL 37* 10/05/2013 0500   CHOLHDL 3.6 10/05/2013 0500   VLDL 30 10/05/2013 0500   LDLCALC 67 10/05/2013 0500      Wt Readings from Last 3 Encounters:  07/08/15 360 lb (163.295 kg)  04/15/15 355 lb (161.027 kg)  10/29/14 341 lb 14.4 oz (155.085 kg)     ASSESSMENT AND PLAN:  1. Acute on chronic biventricular diastolic heart failure. He has signs and symptoms of predominantly right heart failure but also significant exertional dyspnea and documented diastolic dysfunction by previous echo. Treatment of heart failure is difficult due to the presence of significant chronic kidney disease, probably stage IV. Will try to switch from furosemide to torsemide. Since his medications are set out a week in advance and he is blind, this change will not be made for about a week. I am rather skeptical that this small change related to dramatic improvement. I think we need to enroll him in the heart failure clinic. He will probably require intravenous diuretics in the near future and will need very close monitoring of his renal function to avoid progression to acute renal failure. Weight monitoring is also a big challenge since he is blind. Total gym restriction was reviewed in detail. Far as I can tell he is doing his best to eat a low-sodium diet, but his social situation limits the choices. He may be as much as 70 pounds above his "dry weight".  2. Sinus node dysfunction with normally functioning dual-chamber permanent pacemaker. CareLink every 3 months and in office check yearly   3. Systemic hypertension, well controlled  4. Paroxysmal atrial fibrillation, no recurrence since last pacemaker check. Not a candidate for anticoagulation due to intracranial hemorrhage. Last known episode of atrial  fibrillation October 2014   5. Chronic amiodarone therapy, needs periodic reevaluation of liver function tests and thyroid function tests, at least twice yearly  6.Morbid obesity and obstructive sleep apnea, possible chronic hypoventilation. I suspect this is the major cause  for most of his swelling and right heart failure, but there probably is also component of diastolic left ventricular failure.  7. CKD stage 4  8. Coronary artery disease  Currently asymptomatic. Coronary angiography in 2005 showed moderate LAD and circumflex obstruction. Low risk nuclear perfusion scan in 2011. Current LDL cholesterol level is high. Recommend increasing atorvastatin to 40 mg daily. Target LDL less than 70.  9. Mild aortic stenosis Mean gradient 18 mm Hg, not yet clinically significant  10. History of intracranial hemorrhage (parenchymal hematoma)  11. Legally blind due to macular degeneration  12. History of pulmonary embolism, likely secondary to pacemaker lead associated thrombus    Current medicines are reviewed at length with the patient today.  The patient does not have concerns regarding medicines.   Labs/ tests ordered today include:  Orders Placed This Encounter  Procedures  . Basic metabolic panel  . Magnesium  . EKG 12-Lead     Patient Instructions  Your physician has recommended you make the following change in your medication:   STOP FUROSEMIDE next week and --------  START TORSEMIDE 100MG  TWICE DAILY  Your physician recommends that you return for lab work in: [place] after changing the above meds  A REFERRAL [place].  YOU WILL BE CONTACTED TO MAKE AN APPOINTMENT.  Dr. Sallyanne Kuster recommends that you schedule a follow-up appointment in: [place] ([place]).     Mikael Spray, MD  07/09/2015 12:46 PM    Sanda Klein, MD, [facility] [phone]  office [phone] pager

## 2015-07-08 NOTE — Patient Instructions (Addendum)
Your physician has recommended you make the following change in your medication:   STOP FUROSEMIDE next week and --------  START TORSEMIDE 100MG  TWICE DAILY  Your physician recommends that you return for lab work in: [place] after changing the above meds  A REFERRAL [place].  YOU WILL BE CONTACTED TO MAKE AN APPOINTMENT.  Dr. Sallyanne Kuster recommends that you schedule a follow-up appointment in: [place] ([place]).

## 2015-07-14 ENCOUNTER — Telehealth: Payer: Self-pay | Admitting: Cardiovascular Disease

## 2015-07-14 NOTE — Telephone Encounter (Signed)
Spoke w/ pt Nurse Kennyth Lose told her that I ordered pt another home monitor and that he can bring it to [place] 10-07-15 appt w/ MD and we will show him how to use the home monitor. She verbalized understanding.

## 2015-07-14 NOTE — Telephone Encounter (Signed)
New Message  Pt received a carelink monitor in the mail request a call back to discuss how to use the device. Please call

## 2015-07-15 LAB — CUP PACEART INCLINIC DEVICE CHECK
Battery Remaining Longevity: 118 mo
Brady Statistic AS VS Percent: 3 %
Implantable Lead Implant Date: 20050301
Implantable Lead Implant Date: 20050310
Implantable Lead Location: 753859
Implantable Lead Model: 5594
Lead Channel Pacing Threshold Amplitude: 1.875 V
Lead Channel Pacing Threshold Pulse Width: 0.4 ms
Lead Channel Sensing Intrinsic Amplitude: 16 mV
Lead Channel Setting Pacing Amplitude: 1.5 V
Lead Channel Setting Pacing Pulse Width: 1 ms
Lead Channel Setting Sensing Sensitivity: 5.6 mV
MDC IDC LEAD LOCATION: 753860
MDC IDC MSMT BATTERY IMPEDANCE: 178 Ohm
MDC IDC MSMT BATTERY VOLTAGE: 2.79 V
MDC IDC MSMT LEADCHNL RA IMPEDANCE VALUE: 418 Ohm
MDC IDC MSMT LEADCHNL RA PACING THRESHOLD AMPLITUDE: 0.75 V
MDC IDC MSMT LEADCHNL RA PACING THRESHOLD PULSEWIDTH: 0.4 ms
MDC IDC MSMT LEADCHNL RV IMPEDANCE VALUE: 764 Ohm
MDC IDC SESS DTM: 20161122154531
MDC IDC SET LEADCHNL RV PACING AMPLITUDE: 4 V
MDC IDC STAT BRADY AP VP PERCENT: 4 %
MDC IDC STAT BRADY AP VS PERCENT: 94 %
MDC IDC STAT BRADY AS VP PERCENT: 0 %

## 2015-07-22 ENCOUNTER — Encounter: Payer: Self-pay | Admitting: Cardiovascular Disease

## 2015-07-22 ENCOUNTER — Telehealth: Payer: Self-pay | Admitting: Cardiovascular Disease

## 2015-07-23 NOTE — Telephone Encounter (Signed)
Close encounter 

## 2015-07-28 ENCOUNTER — Encounter (HOSPITAL_COMMUNITY): Payer: Self-pay

## 2015-07-28 ENCOUNTER — Ambulatory Visit (HOSPITAL_COMMUNITY)
Admission: RE | Admit: 2015-07-28 | Discharge: 2015-07-28 | Disposition: A | Discharge status: Home / Self Care | Payer: Medicare (Managed Care) | Source: Ambulatory Visit | Attending: Cardiology | Admitting: Cardiology

## 2015-07-28 VITALS — BP 126/68 | HR 73 | Wt 349.8 lb

## 2015-07-28 DIAGNOSIS — Z79899 Other long term (current) drug therapy: Secondary | ICD-10-CM | POA: Diagnosis not present

## 2015-07-28 DIAGNOSIS — I480 Paroxysmal atrial fibrillation: Secondary | ICD-10-CM | POA: Diagnosis not present

## 2015-07-28 DIAGNOSIS — R001 Bradycardia, unspecified: Secondary | ICD-10-CM | POA: Insufficient documentation

## 2015-07-28 DIAGNOSIS — E1142 Type 2 diabetes mellitus with diabetic polyneuropathy: Secondary | ICD-10-CM | POA: Diagnosis not present

## 2015-07-28 DIAGNOSIS — Z825 Family history of asthma and other chronic lower respiratory diseases: Secondary | ICD-10-CM | POA: Diagnosis not present

## 2015-07-28 DIAGNOSIS — E1122 Type 2 diabetes mellitus with diabetic chronic kidney disease: Secondary | ICD-10-CM | POA: Diagnosis not present

## 2015-07-28 DIAGNOSIS — I5033 Acute on chronic diastolic (congestive) heart failure: Secondary | ICD-10-CM | POA: Insufficient documentation

## 2015-07-28 DIAGNOSIS — G4733 Obstructive sleep apnea (adult) (pediatric): Secondary | ICD-10-CM | POA: Diagnosis not present

## 2015-07-28 DIAGNOSIS — Z7982 Long term (current) use of aspirin: Secondary | ICD-10-CM | POA: Diagnosis not present

## 2015-07-28 DIAGNOSIS — I130 Hypertensive heart and chronic kidney disease with heart failure and stage 1 through stage 4 chronic kidney disease, or unspecified chronic kidney disease: Secondary | ICD-10-CM | POA: Insufficient documentation

## 2015-07-28 DIAGNOSIS — Z8249 Family history of ischemic heart disease and other diseases of the circulatory system: Secondary | ICD-10-CM | POA: Insufficient documentation

## 2015-07-28 DIAGNOSIS — Z794 Long term (current) use of insulin: Secondary | ICD-10-CM | POA: Insufficient documentation

## 2015-07-28 DIAGNOSIS — I2510 Atherosclerotic heart disease of native coronary artery without angina pectoris: Secondary | ICD-10-CM | POA: Diagnosis not present

## 2015-07-28 DIAGNOSIS — I350 Nonrheumatic aortic (valve) stenosis: Secondary | ICD-10-CM | POA: Insufficient documentation

## 2015-07-28 DIAGNOSIS — I5032 Chronic diastolic (congestive) heart failure: Secondary | ICD-10-CM | POA: Insufficient documentation

## 2015-07-28 DIAGNOSIS — Z87891 Personal history of nicotine dependence: Secondary | ICD-10-CM | POA: Diagnosis not present

## 2015-07-28 DIAGNOSIS — N183 Chronic kidney disease, stage 3 unspecified: Secondary | ICD-10-CM

## 2015-07-28 DIAGNOSIS — H548 Legal blindness, as defined in USA: Secondary | ICD-10-CM | POA: Insufficient documentation

## 2015-07-28 DIAGNOSIS — Z89432 Acquired absence of left foot: Secondary | ICD-10-CM | POA: Insufficient documentation

## 2015-07-28 DIAGNOSIS — H3530 Unspecified macular degeneration: Secondary | ICD-10-CM | POA: Diagnosis not present

## 2015-07-28 DIAGNOSIS — Z950 Presence of cardiac pacemaker: Secondary | ICD-10-CM | POA: Diagnosis not present

## 2015-07-28 LAB — BASIC METABOLIC PANEL
Anion gap: 14 (ref 5–15)
BUN: 113 mg/dL — AB (ref 6–20)
CO2: 26 mmol/L (ref 22–32)
CREATININE: 3.41 mg/dL — AB (ref 0.61–1.24)
Calcium: 8.6 mg/dL — ABNORMAL LOW (ref 8.9–10.3)
Chloride: 92 mmol/L — ABNORMAL LOW (ref 101–111)
GFR calc Af Amer: 20 mL/min — ABNORMAL LOW (ref >60)
GFR, EST NON AFRICAN AMERICAN: 17 mL/min — AB (ref >60)
Glucose, Bld: 333 mg/dL — ABNORMAL HIGH (ref 65–99)
POTASSIUM: 3.5 mmol/L (ref 3.5–5.1)
SODIUM: 132 mmol/L — AB (ref 135–145)

## 2015-07-28 LAB — BRAIN NATRIURETIC PEPTIDE: B NATRIURETIC PEPTIDE 5: 98.1 pg/mL (ref 0.0–100.0)

## 2015-07-28 MED ORDER — METOLAZONE 5 MG PO TABS: 5.0000 mg | ORAL_TABLET | ORAL | Status: DC

## 2015-07-28 NOTE — Progress Notes (Signed)
Patient ID: [name], male   DOB: 06-12-1946, 69 y.o.   MRN: [id number] PCP: [name] Cardiology: Dr. Sallyanne Kuster HF Cardiology: Dr. Aundra Dubin  69 yo medically complicated man with chronic diastolic CHF complicated by RV dysfunction, morbid obesity, CKD stage III, symptomatic sinus bradycardia s/p PPM and blindness presents for initial CHF clinic evaluation.  I reviewed extensive records.  He is followed at the [facility] clinic where his medications are made up for him in bubble packs.  He is very limited.  He can walk with his walker but is short of breath after going about 15 feet. He walked in the office today and had to stop 3-4 times.  +Bendopnea and orthopnea, stable.  He lives with a roommate who helps him with his ADLs.  No lightheadedness.  No syncope.  He uses his CPAP every night. He wears compressions stockings. He has a chronic cough that has been present for over 3 weeks now, worse when he lies down.    When he saw Dr Sallyanne Kuster most recently, Lasix stopped and torsemide 100 mg bid was started.  He has been taking metolazone also 3 times/week.  Weight is down about 11 lbs since switching to torsemide.    ECG: a-paced, v-sensed (11/16)  Labs (11/16): K 3.8, creatinine 2.24  PMH: 1. Chronic diastolic CHF with RV dysfunction: Echo (2/15) with EF 60%, mild LVH, mild aortic stenosis with mean gradient 18 mmHg, RV mildly dilated with mildly decreased systolic function.  2. Aortic stenosis: Mild.  3. Type II diabetes 4. LBBB 5. OSA: Uses CPAP.  6. Morbid obesity.  7. HTN 8. CAD: Nonobstructive.  [name] 2005 with 60% LAD stenosis. 9. 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  10. Atrial fibrillation: Paroxysmal.  None recently.   11. CKD stage III 12. Macular degeneration: Legally blind.  13. 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  14. 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 15. S/p left foot amputation 16. Diabetic peripheral neuropathy.   Social History    Social History  . Marital Status: Single    Spouse Name: [name]  . Number of Children: Y  . Years of Education: [name]   Occupational History  . disabled    Social History Main Topics  . Smoking status: Former Smoker -- 0.00 packs/day for 0 years    Types: Cigarettes    Quit date: 08/17/2003  . Smokeless tobacco: Never Used     Comment: started at age 18.  only smoked on occ- very rarely.0  . Alcohol Use: Yes     Comment: 06/23/2013 "drank some; never had a problem w/it"  . Drug Use: No  . Sexual Activity: No   Other Topics Concern  . Lives with roommate.   Family History  Problem Relation Age of Onset  . Coronary artery disease Sister   . Heart disease Sister   . Allergies Sister   . Asthma Brother   . Rheum arthritis Brother    ROS: All systems reviewed and negative except as per HPI.  Current Outpatient Prescriptions  Medication Sig Dispense Refill  . allopurinol (ZYLOPRIM) 100 MG tablet Take 100 mg by mouth daily.    Marland Kitchen amiodarone (PACERONE) 200 MG tablet Take 200 mg by mouth daily.    Marland Kitchen amLODipine (NORVASC) 10 MG tablet Take 10 mg by mouth daily.    Marland Kitchen aspirin 325 MG tablet Take 325 mg by mouth daily.    Marland Kitchen atorvastatin (LIPITOR) 40 MG tablet Take 1 tablet (40  mg total) by mouth daily. 90 tablet 3  . brimonidine (ALPHAGAN) 0.2 % ophthalmic solution Place 1 drop into both eyes 3 (three) times daily. 5 mL 12  . Cholecalciferol (VITAMIN D-3) 1000 UNITS CAPS Take 1 capsule by mouth daily.    Marland Kitchen gabapentin (NEURONTIN) 300 MG capsule Take 300 mg by mouth 2 (two) times daily.    . hydrALAZINE (APRESOLINE) 25 MG tablet Take 25 mg by mouth 3 (three) times daily.    . insulin detemir (LEVEMIR) 100 UNIT/ML injection Inject 60 Units into the skin 2 (two) times daily.    Marland Kitchen lidocaine (LIDODERM) 5 % Place onto the skin daily.    . Melatonin 3 MG CAPS Take 1 capsule by mouth at bedtime as needed.    . Menthol, Topical Analgesic, (BIOFREEZE EX) Apply 1 application topically daily as  needed.    . methylcellulose (ARTIFICIAL TEARS) 1 % ophthalmic solution Place 1 drop into both eyes 2 (two) times daily.    . metolazone (ZAROXOLYN) 5 MG tablet Take 1 tablet (5 mg total) by mouth 4 (four) times a week. Monday, Tuesday, Wednesday, Friday 20 tablet 6  . metoprolol succinate (TOPROL-XL) 100 MG 24 hr tablet Take 100 mg by mouth daily. Take with or immediately following a meal.    . pantoprazole (PROTONIX) 40 MG tablet Take 40 mg by mouth daily.    . sennosides-docusate sodium (SENOKOT-S) 8.6-50 MG tablet Take 1 tablet by mouth daily as needed for constipation.    . Skin Protectants, Misc. (EUCERIN) cream Apply 1 application topically 2 (two) times daily.    Marland Kitchen torsemide (DEMADEX) 100 MG tablet Take 1 tablet (100 mg total) by mouth 2 (two) times daily. 60 tablet 6  . valsartan (DIOVAN) 160 MG tablet Take 160 mg by mouth daily.     No current facility-administered medications for this encounter.   BP 126/68 mmHg  Pulse 73  Wt 349 lb 12 oz (158.646 kg)  SpO2 96% General: NAD, morbidly obese.  Neck: Thick, JVP difficult, no thyromegaly or thyroid nodule.  Lungs: Clear to auscultation bilaterally with normal respiratory effort. CV: Nondisplaced PMI.  Heart regular S1/S2, no S3/S4, 2/6 early SEM RUSB.  1+ chronic edema 1/2 to knees bilaterally.  No carotid bruit. Unable to palpate pedal pulses due to edema.  Abdomen: Soft, nontender, no hepatosplenomegaly, no distention.  Skin: Intact without lesions or rashes.  Neurologic: Alert and oriented x 3.  Psych: Normal affect. Extremities: No clubbing or cyanosis.  HEENT: Normal.   Assessment/Plan: 1. Chronic diastolic CHF with prominent RV failure: NYHA class IIIb symptoms.  Suspect symptoms are a combination of volume overload and obesity/deconditioning.  His weight is coming down on torsemide, but I suspect that he still has significant residual volume.  Will have to be careful with diuresis given CKD.  - Continue torsemide 100 mg  bid.  - I will incrementally increase metolazone, he will now take four times/week.   - BMET/BNP today and repeat BMET in 2 wks.  - Last echo was in 2/15.  At next appointment, will arrange for repeat echo.  2. Aortic stenosis: Mild on last echo.   3. Atrial fibrillation: Paroxysmal.  Last episode was in 10/14 based on device interrogation.  He is not anticoagulated (takes ASA). Suspect that this is related to prior intracerebral hemorrhage.  4. CAD: Nonobstructive.  No chest pain.  He is on ASA and statin.  5. Bradycardia s/p Medtronic PPM: Recent interrogation showd 97% a-pacing, minimal v-pacing.  6. CKD: Stage III.  Will need to follow carefully as diuretics are increased.   Loralie Champagne 07/28/2015

## 2015-07-28 NOTE — Patient Instructions (Signed)
INCREASE Metolazone to 4 times weekly (Monday, Tuesday, Wednesday, Friday). Take 30 minutes before morning Torsemide.  Routine lab work today. Will notify you of abnormal results, otherwise no news is good news!  Follow up in 3 weeks.  Do the following things EVERYDAY: 1) Weigh yourself in the morning before breakfast. Write it down and keep it in a log. 2) Take your medicines as prescribed 3) Eat low salt foods-Limit salt (sodium) to 2000 mg per day.  4) Stay as active as you can everyday 5) Limit all fluids for the day to less than 2 liters

## 2015-07-28 NOTE — Addendum Note (Signed)
Encounter addended by: Larey Dresser, MD on: 07/28/2015 11:11 PM<BR>     Documentation filed: Clinical Notes

## 2015-07-29 ENCOUNTER — Encounter: Payer: Medicare (Managed Care) | Admitting: Cardiovascular Disease

## 2015-07-31 ENCOUNTER — Telehealth (HOSPITAL_COMMUNITY): Payer: Self-pay | Admitting: *Deleted

## 2015-07-31 NOTE — Telephone Encounter (Signed)
Notes Recorded by Scarlette Calico, RN on 07/31/2015 at 4:51 PM Finally spoke w/pt, he is aware of changes but states [facility] fixes his pills in a blister pack for him so I need to call them. Called [facility] at [phone] and spoke w/RN there, gave orders for med changes and bmet next week, she will fax [place] results

## 2015-07-31 NOTE — Telephone Encounter (Signed)
-----   Message from Larey Dresser, MD sent at 07/29/2015  4:30 PM EST ----- Stop torsemide x 4 days, then restart.  Stop valsartan and metolazone for good.  Please call today. Needs BMET again on Thursday.

## 2015-08-07 ENCOUNTER — Telehealth (HOSPITAL_COMMUNITY): Payer: Self-pay | Admitting: *Deleted

## 2015-08-07 NOTE — Telephone Encounter (Signed)
Received labs from [facility]:  Bun 111 CR 2.97 K 3.1  Per Darrick Grinder, NP pt needs to start KCL 40 meq daily.  [facility] goes to [facility] and they fix his medications in blister packs for him.  I called [facility] at [phone] and left mess for the triage nurse to call back

## 2015-08-08 NOTE — Telephone Encounter (Signed)
[facility] returned call 12/22, order for med changes and repeat labs in 1 week faxed to them at [phone]

## 2015-08-15 ENCOUNTER — Other Ambulatory Visit (HOSPITAL_COMMUNITY): Payer: Self-pay | Admitting: Cardiology

## 2015-08-20 ENCOUNTER — Ambulatory Visit (HOSPITAL_COMMUNITY)
Admission: RE | Admit: 2015-08-20 | Discharge: 2015-08-20 | Disposition: A | Discharge status: Home / Self Care | Payer: Medicare (Managed Care) | Source: Ambulatory Visit | Attending: Internal Medicine | Admitting: Internal Medicine

## 2015-08-20 ENCOUNTER — Ambulatory Visit (HOSPITAL_COMMUNITY)
Admission: RE | Admit: 2015-08-20 | Discharge: 2015-08-20 | Disposition: A | Discharge status: Home / Self Care | Payer: Medicare (Managed Care) | Source: Ambulatory Visit | Attending: Adult Health | Admitting: Adult Health

## 2015-08-20 VITALS — BP 128/72 | HR 86 | Wt 352.0 lb

## 2015-08-20 DIAGNOSIS — R05 Cough: Secondary | ICD-10-CM

## 2015-08-20 DIAGNOSIS — R0602 Shortness of breath: Secondary | ICD-10-CM | POA: Insufficient documentation

## 2015-08-20 DIAGNOSIS — I480 Paroxysmal atrial fibrillation: Secondary | ICD-10-CM | POA: Diagnosis not present

## 2015-08-20 DIAGNOSIS — R059 Cough, unspecified: Secondary | ICD-10-CM

## 2015-08-20 DIAGNOSIS — I5032 Chronic diastolic (congestive) heart failure: Secondary | ICD-10-CM | POA: Diagnosis not present

## 2015-08-20 DIAGNOSIS — I517 Cardiomegaly: Secondary | ICD-10-CM | POA: Diagnosis not present

## 2015-08-20 DIAGNOSIS — J9811 Atelectasis: Secondary | ICD-10-CM | POA: Diagnosis not present

## 2015-08-20 DIAGNOSIS — N183 Chronic kidney disease, stage 3 unspecified: Secondary | ICD-10-CM

## 2015-08-20 DIAGNOSIS — R0989 Other specified symptoms and signs involving the circulatory and respiratory systems: Secondary | ICD-10-CM | POA: Diagnosis not present

## 2015-08-20 MED ORDER — METOLAZONE 2.5 MG PO TABS: 2.5000 mg | ORAL_TABLET | ORAL | Status: DC

## 2015-08-20 NOTE — Progress Notes (Signed)
Patient ID: [name], male   DOB: Apr 13, 1946, 70 y.o.   MRN: [id number] PCP: [name] Cardiology: Dr. Sallyanne Kuster HF Cardiology: Dr. Aundra Dubin  70 yo medically complicated man with chronic diastolic CHF complicated by RV dysfunction, morbid obesity, CKD stage III, symptomatic sinus bradycardia s/p PPM and blindness.   He returns for HF follow up. Last visit metolazone was increased to 4 times a week but due to worsening renal function this was stopped as well as valsartan. SOB with exertion. Denies PND. + Orthopnea.  Says he is coughing a lot at night. He has to sit up on the side of the bed at night. Eats lots of frozen meals daily. Drinking over 2 liters per day. Medications are provided by [facility] every Saturday in a blister pack. [facility] attends [facility] times week. Last weight at [facility] was 355 pounds.  Lives at home alone but has friends that check on him. He has an Aide 3 times a week.   Labs (11/16): K 3.8, creatinine 2.24 Labs (07/28/2015): K 3.5 Creatinine 3.41  Labs (08/19/2015): K 4.3 Creatinine 1.67   PMH: 1. Chronic diastolic CHF with RV dysfunction: Echo (2/15) with EF 60%, mild LVH, mild aortic stenosis with mean gradient 18 mmHg, RV mildly dilated with mildly decreased systolic function.  2. Aortic stenosis: Mild.  3. Type II diabetes 4. LBBB 5. OSA: Uses CPAP.  6. Morbid obesity.  7. HTN 8. CAD: Nonobstructive.  [place] 2005 with 60% LAD stenosis. 9. 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  10. Atrial fibrillation: Paroxysmal.  None recently.   11. CKD stage III 12. Macular degeneration: Legally blind.  13. 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  14. 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 15. S/p left foot amputation 16. Diabetic peripheral neuropathy.   Social History   Social History  . Marital Status: Single    Spouse Name: N/A  . Number of Children: Y  . Years of Education: N/A   Occupational History  . disabled    Social History  Main Topics  . Smoking status: Former Smoker -- 0.00 packs/day for 0 years    Types: Cigarettes    Quit date: 08/17/2003  . Smokeless tobacco: Never Used     Comment: started at age 70.  only smoked on occ- very rarely.0  . Alcohol Use: Yes     Comment: 06/23/2013 "drank some; never had a problem w/it"  . Drug Use: No  . Sexual Activity: No   Other Topics Concern  . Lives with roommate.   Family History  Problem Relation Age of Onset  . Coronary artery disease Sister   . Heart disease Sister   . Allergies Sister   . Asthma Brother   . Rheum arthritis Brother    ROS: All systems reviewed and negative except as per HPI.  Current Outpatient Prescriptions  Medication Sig Dispense Refill  . allopurinol (ZYLOPRIM) 100 MG tablet Take 100 mg by mouth daily.    Marland Kitchen amiodarone (PACERONE) 200 MG tablet Take 200 mg by mouth daily.    Marland Kitchen amLODipine (NORVASC) 10 MG tablet Take 10 mg by mouth daily.    Marland Kitchen aspirin 325 MG tablet Take 325 mg by mouth daily.    Marland Kitchen atorvastatin (LIPITOR) 40 MG tablet Take 1 tablet (40 mg total) by mouth daily. 90 tablet 3  . brimonidine (ALPHAGAN) 0.2 % ophthalmic solution Place 1 drop into both eyes 3 (three) times daily. 5 mL 12  . Cholecalciferol (  VITAMIN D-3) 1000 UNITS CAPS Take 1 capsule by mouth daily.    Marland Kitchen gabapentin (NEURONTIN) 300 MG capsule Take 300 mg by mouth 2 (two) times daily.    . hydrALAZINE (APRESOLINE) 25 MG tablet Take 25 mg by mouth 3 (three) times daily.    . insulin detemir (LEVEMIR) 100 UNIT/ML injection Inject 60 Units into the skin 2 (two) times daily.    Marland Kitchen lidocaine (LIDODERM) 5 % Place onto the skin daily.    . Melatonin 3 MG CAPS Take 1 capsule by mouth at bedtime as needed.    . Menthol, Topical Analgesic, (BIOFREEZE EX) Apply 1 application topically daily as needed.    . methylcellulose (ARTIFICIAL TEARS) 1 % ophthalmic solution Place 1 drop into both eyes 2 (two) times daily.    . metoprolol succinate (TOPROL-XL) 100 MG 24 hr tablet  Take 100 mg by mouth daily. Take with or immediately following a meal.    . pantoprazole (PROTONIX) 40 MG tablet Take 40 mg by mouth daily.    . sennosides-docusate sodium (SENOKOT-S) 8.6-50 MG tablet Take 1 tablet by mouth daily as needed for constipation.    . Skin Protectants, Misc. (EUCERIN) cream Apply 1 application topically 2 (two) times daily.    Marland Kitchen torsemide (DEMADEX) 100 MG tablet Take 1 tablet (100 mg total) by mouth 2 (two) times daily. 60 tablet 6   No current facility-administered medications for this encounter.   BP 128/72 mmHg  Pulse 86  Wt 352 lb (159.666 kg)  SpO2 93% General: NAD, morbidly obese. Sitting in wheel chair.  Neck: Thick, JVP difficult, no thyromegaly or thyroid nodule.  Lungs: Rhonchi throughout.  CV: Nondisplaced PMI.  Heart regular S1/S2, no S3/S4, 2/6 early SEM RUSB.  1+ chronic edema 1/2 to knees bilaterally.  No carotid bruit. Unable to palpate pedal pulses due to edema.  Abdomen: Obese, Soft, nontender, no hepatosplenomegaly, + distention.  Skin: Intact without lesions or rashes.  Neurologic: Alert and oriented x 3.  Psych: Normal affect. Extremities: No clubbing or cyanosis. RLE coban compression wrap.  HEENT: Normal.   Assessment/Plan: 1. Acute/Chronic diastolic CHF with prominent RV failure: NYHA class IIIb symptoms.   -Increased dyspnea on exertion, cough,  and orthopnea with worsening cough at night.  Volume overloaded today. Will need to add metolazone 2.5 mg once a week. Continue torsemide 100 mg bid.  2 View CXR.  BMET reviewed from 08/19/2015 Renal function improved off metolazone. Ask [facility] to check BMET next week.  I have asked him cut back fluid intake and limit the amount of frozen foods.  - Set up ECHO at next visit .   2. Aortic stenosis: Mild on last echo.   3. Atrial fibrillation: Paroxysmal.  Last episode was in 10/14 based on device interrogation.  He is not anticoagulated (takes ASA). Suspect that this is related to prior  intracerebral hemorrhage. Regular rhythm today.  4. CAD: Nonobstructive.  No chest pain.  He is on ASA and statin.  5. Bradycardia s/p Medtronic PPM: Recent interrogation showd 97% a-pacing, minimal v-pacing.  6. CKD: Stage III.  Will need to follow carefully as diuretics are increased. Check BMET next week by [facility].   Follow up in 2 weeks to reassess volume status. Set up ECHO  Indalecio Malmstrom NP-C 08/20/2015

## 2015-08-20 NOTE — Patient Instructions (Signed)
START Metolazone 2.5 mg, one tab weekly  Labs needed in one week  Your physician recommends that you schedule a follow-up appointment in: 2 weeks

## 2015-08-20 NOTE — Progress Notes (Signed)
Advanced Heart Failure Medication Review by a Pharmacist  Does the patient  feel that his/her medications are working for him/her?  yes  Has the patient been experiencing any side effects to the medications prescribed?  no  Does the patient measure his/her own blood pressure or blood glucose at home?  yes   Does the patient have any problems obtaining medications due to transportation or finances?   no  Understanding of regimen: good Understanding of indications: good Potential of compliance: good Patient understands to avoid NSAIDs. Patient understands to avoid decongestants.  Issues to address at subsequent visits: None   Pharmacist comments:  [name] is a pleasant 70 yo blind M presenting without a medication list. He receives care at [facility] who faxed [place] a list of his current medications and this was reconciled with our list. No major discrepancies noted.  [name]. [name], PharmD, BCPS, CPP Clinical Pharmacist Pager: [phone] Phone: [phone] 08/20/2015 11:03 AM      Time with patient: 2 minutes Preparation and documentation time: 10 minutes Total time: 12 minutes

## 2015-08-22 ENCOUNTER — Encounter (HOSPITAL_COMMUNITY): Payer: Medicare (Managed Care)

## 2015-08-22 IMAGING — CR DG FOOT COMPLETE 3+V*L*
3 series · 3 of 3 positions shown · non-contrast
Comparison: None

CLINICAL DATA: Osteomyelitis, nonhealing wound at plantar aspect of
foot near 5th metatarsal, history diabetes, hypertension,
hyperlipidemia, gout

EXAM:
LEFT FOOT - COMPLETE 3+ VIEW

[t foot ap left]
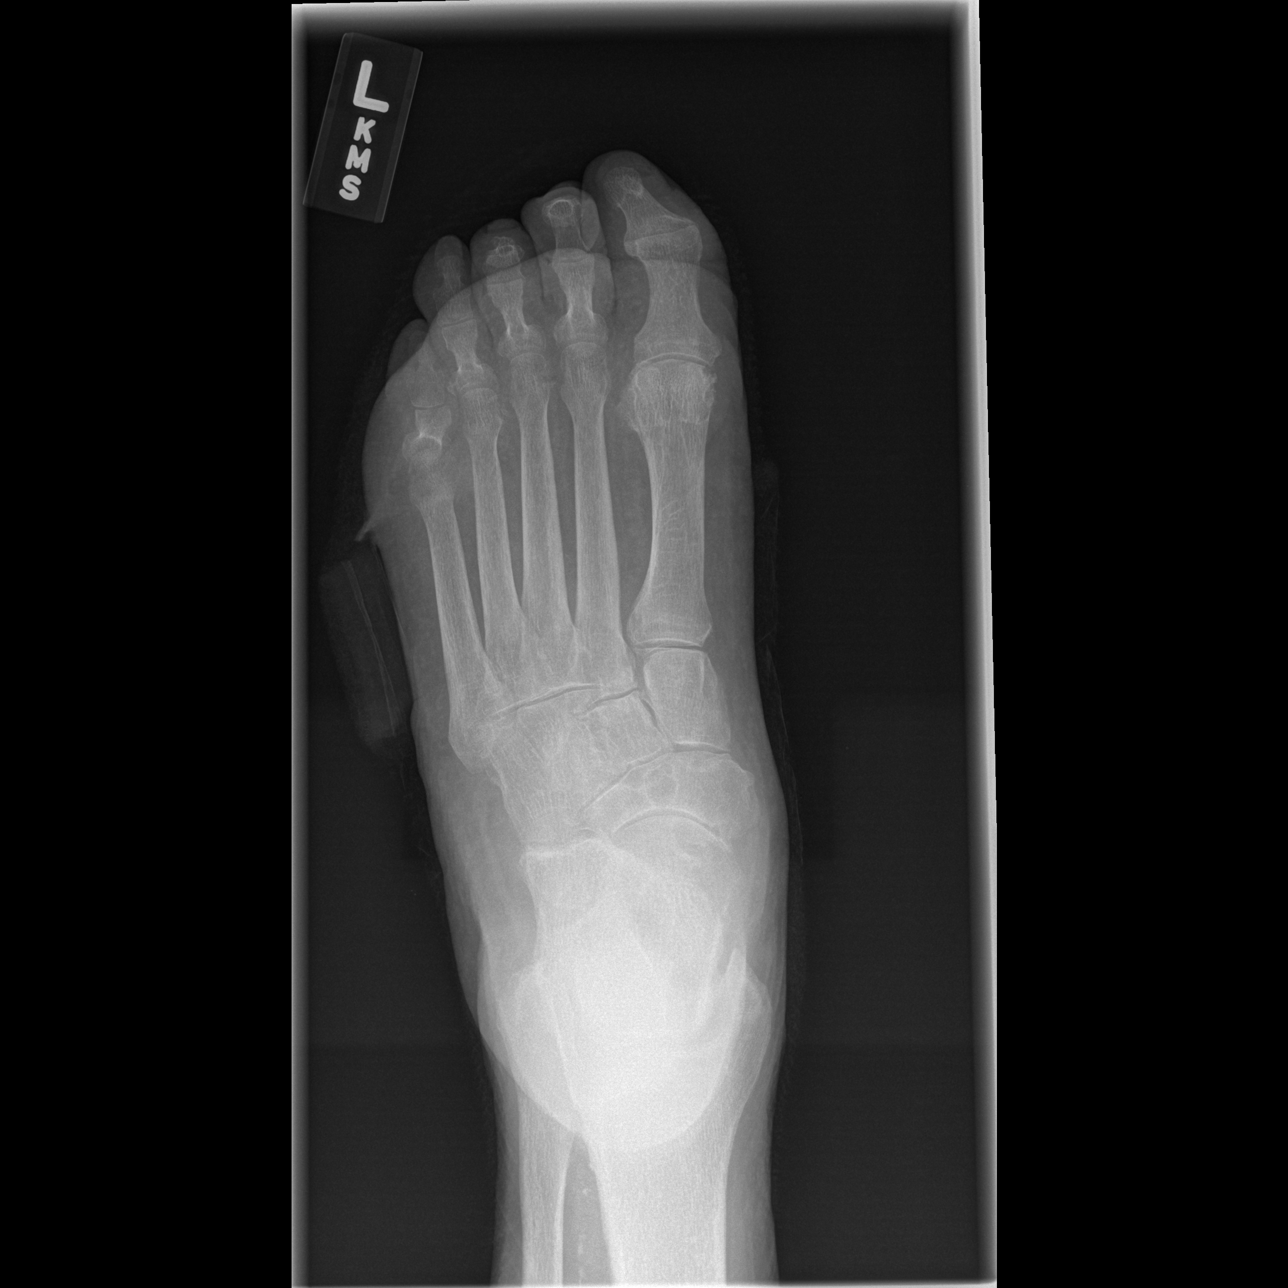

[t foot oblique left]
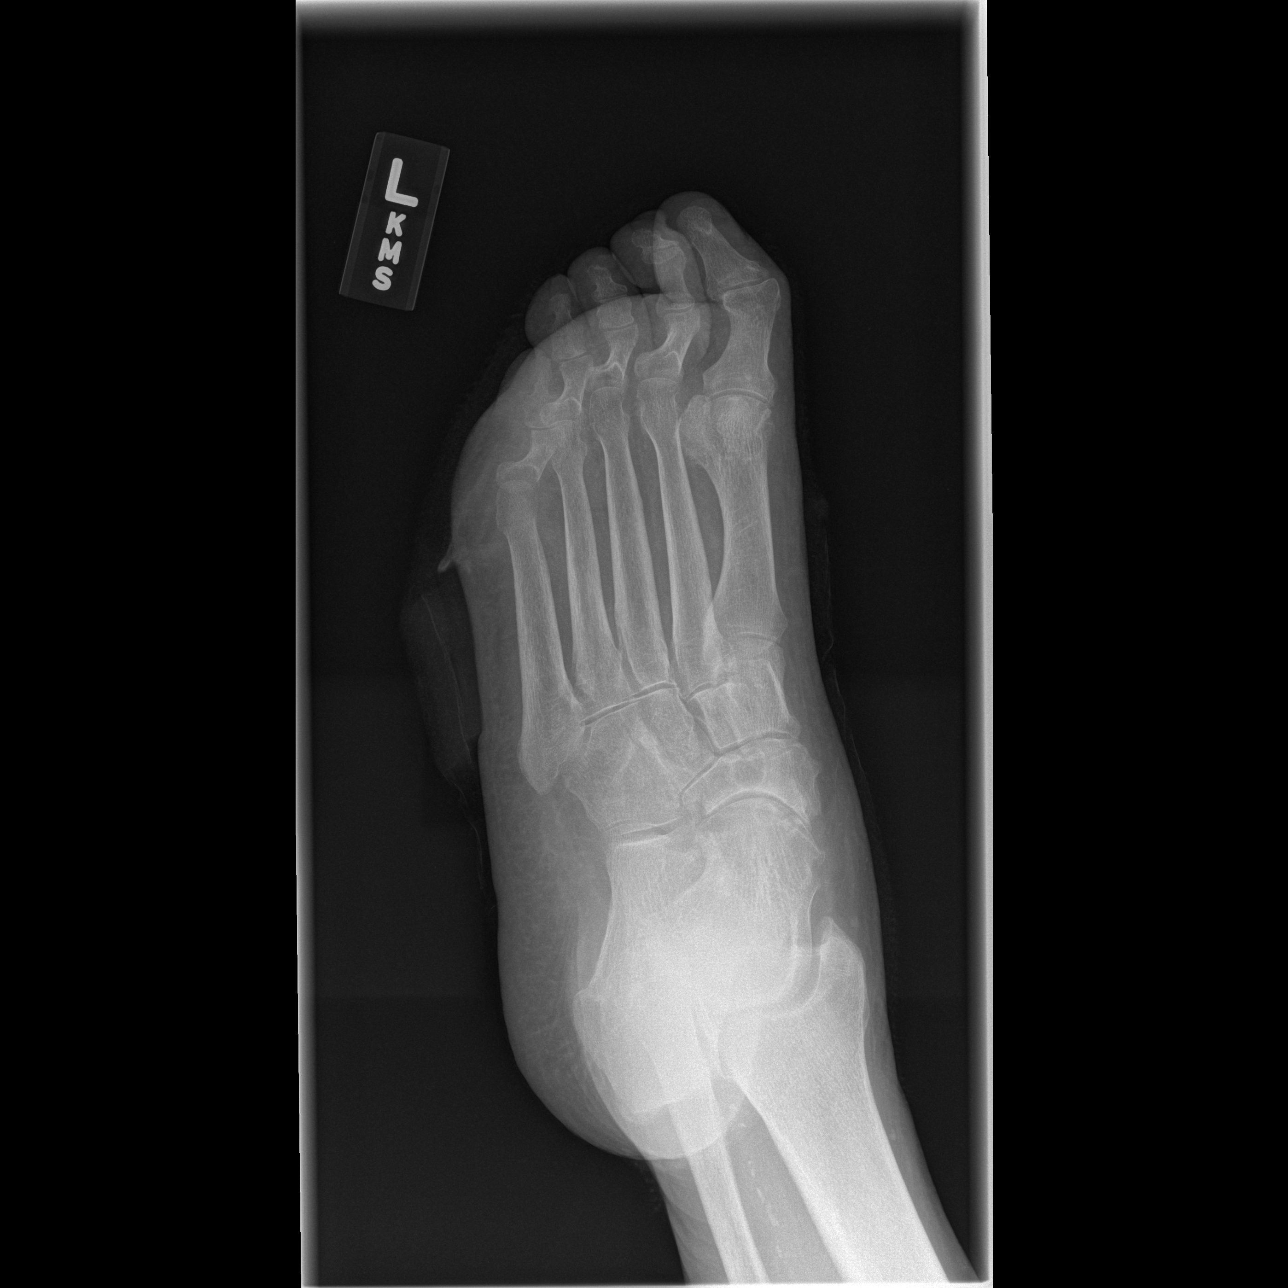

[t foot lat left]
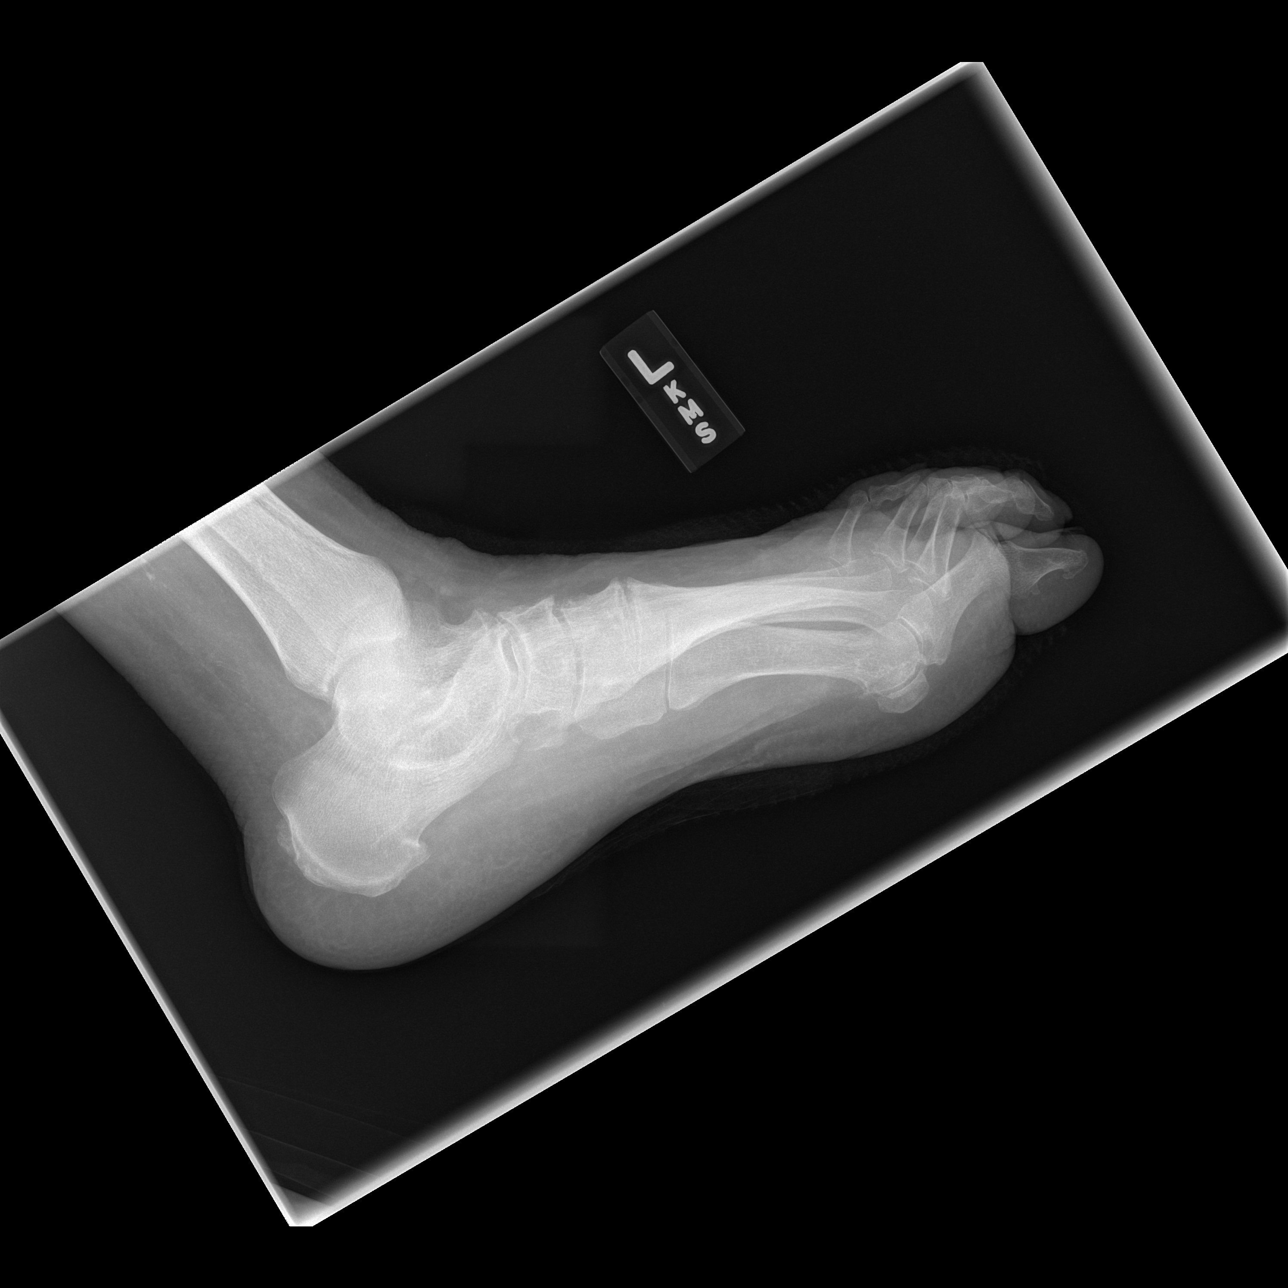

[3 of 3 positions shown; findings below may reference images not displayed]

FINDINGS: Diffuse osseous demineralization.

Joint space narrowing at 1st MTP joint with juxta-articular erosions
question gout.

Diffuse soft tissue swelling.

Additional large subchondral cyst noted at tarsal navicular with
scattered areas of intertarsal joint space narrowing, may also by
related to gout.

Mild calcaneal spur formation.

Scattered intertarsal degenerative changes and spur formation.

Dressing artifacts at the lateral margin of the midfoot.

No acute fracture, dislocation or additional bone destruction seen.

Specifically no definite radiographic evidence of osteomyelitis at
the 5th metatarsal.
IMPRESSION: Osseous demineralization.

Scattered degenerative changes left foot with question gout
arthropathy changes at the 1st MTP joint and at the talonavicular
joint.

No definite evidence of osteomyelitis though if this remains a
clinical concern recommend MR imaging.

## 2015-08-25 ENCOUNTER — Other Ambulatory Visit (HOSPITAL_COMMUNITY): Payer: Self-pay | Admitting: Cardiology

## 2015-09-01 ENCOUNTER — Telehealth (HOSPITAL_COMMUNITY): Payer: Self-pay | Admitting: *Deleted

## 2015-09-01 NOTE — Telephone Encounter (Signed)
Left vm for pt to call back for lab results.   Creatnine 2.97 " make sure only taking metolazone once a week and see if weight is stable" (per [place])

## 2015-09-02 NOTE — Telephone Encounter (Signed)
Called pt again this morning and pts roommate answered the phone and stated [facility] was at [facility] and to try and call again today after 2pm

## 2015-09-03 ENCOUNTER — Ambulatory Visit (HOSPITAL_COMMUNITY)
Admission: RE | Admit: 2015-09-03 | Discharge: 2015-09-03 | Disposition: A | Discharge status: Home / Self Care | Payer: Medicare (Managed Care) | Source: Ambulatory Visit | Attending: Adult Health | Admitting: Adult Health

## 2015-09-03 VITALS — BP 116/78 | HR 87 | Wt 351.8 lb

## 2015-09-03 DIAGNOSIS — G4733 Obstructive sleep apnea (adult) (pediatric): Secondary | ICD-10-CM | POA: Insufficient documentation

## 2015-09-03 DIAGNOSIS — Z79899 Other long term (current) drug therapy: Secondary | ICD-10-CM | POA: Insufficient documentation

## 2015-09-03 DIAGNOSIS — I5032 Chronic diastolic (congestive) heart failure: Secondary | ICD-10-CM | POA: Diagnosis not present

## 2015-09-03 DIAGNOSIS — N183 Chronic kidney disease, stage 3 unspecified: Secondary | ICD-10-CM

## 2015-09-03 DIAGNOSIS — F1010 Alcohol abuse, uncomplicated: Secondary | ICD-10-CM | POA: Insufficient documentation

## 2015-09-03 DIAGNOSIS — I5033 Acute on chronic diastolic (congestive) heart failure: Secondary | ICD-10-CM

## 2015-09-03 DIAGNOSIS — Z794 Long term (current) use of insulin: Secondary | ICD-10-CM | POA: Diagnosis not present

## 2015-09-03 DIAGNOSIS — E119 Type 2 diabetes mellitus without complications: Secondary | ICD-10-CM | POA: Diagnosis not present

## 2015-09-03 DIAGNOSIS — I480 Paroxysmal atrial fibrillation: Secondary | ICD-10-CM | POA: Insufficient documentation

## 2015-09-03 DIAGNOSIS — H3530 Unspecified macular degeneration: Secondary | ICD-10-CM | POA: Diagnosis not present

## 2015-09-03 DIAGNOSIS — Z0189 Encounter for other specified special examinations: Secondary | ICD-10-CM | POA: Diagnosis present

## 2015-09-03 DIAGNOSIS — Z87891 Personal history of nicotine dependence: Secondary | ICD-10-CM | POA: Diagnosis not present

## 2015-09-03 DIAGNOSIS — I350 Nonrheumatic aortic (valve) stenosis: Secondary | ICD-10-CM | POA: Diagnosis not present

## 2015-09-03 DIAGNOSIS — I129 Hypertensive chronic kidney disease with stage 1 through stage 4 chronic kidney disease, or unspecified chronic kidney disease: Secondary | ICD-10-CM | POA: Diagnosis not present

## 2015-09-03 DIAGNOSIS — Z8249 Family history of ischemic heart disease and other diseases of the circulatory system: Secondary | ICD-10-CM | POA: Diagnosis not present

## 2015-09-03 DIAGNOSIS — H548 Legal blindness, as defined in USA: Secondary | ICD-10-CM | POA: Diagnosis not present

## 2015-09-03 DIAGNOSIS — Z7982 Long term (current) use of aspirin: Secondary | ICD-10-CM | POA: Diagnosis not present

## 2015-09-03 DIAGNOSIS — I2510 Atherosclerotic heart disease of native coronary artery without angina pectoris: Secondary | ICD-10-CM | POA: Diagnosis not present

## 2015-09-03 LAB — BASIC METABOLIC PANEL
Anion gap: 9 (ref 5–15)
BUN: 46 mg/dL — ABNORMAL HIGH (ref 6–20)
CALCIUM: 9.2 mg/dL (ref 8.9–10.3)
CO2: 31 mmol/L (ref 22–32)
CREATININE: 2.36 mg/dL — AB (ref 0.61–1.24)
Chloride: 96 mmol/L — ABNORMAL LOW (ref 101–111)
GFR calc non Af Amer: 27 mL/min — ABNORMAL LOW (ref >60)
GFR, EST AFRICAN AMERICAN: 31 mL/min — AB (ref >60)
Glucose, Bld: 235 mg/dL — ABNORMAL HIGH (ref 65–99)
Potassium: 3.7 mmol/L (ref 3.5–5.1)
Sodium: 136 mmol/L (ref 135–145)

## 2015-09-03 LAB — BRAIN NATRIURETIC PEPTIDE: B Natriuretic Peptide: 27.2 pg/mL (ref 0.0–100.0)

## 2015-09-03 MED ORDER — FUROSEMIDE 10 MG/ML IJ SOLN
80.0000 mg | Freq: Once | INTRAMUSCULAR | Status: AC
  Administered 2015-09-03: 80 mg via INTRAVENOUS
  Filled 2015-09-03: qty 8

## 2015-09-03 NOTE — Progress Notes (Signed)
URINE OUTPUT [facility] [place] [id number]

## 2015-09-03 NOTE — Progress Notes (Signed)
Advanced Heart Failure Medication Review by a Pharmacist  Does the patient  feel that his/her medications are working for him/her?  yes  Has the patient been experiencing any side effects to the medications prescribed?  no  Does the patient measure his/her own blood pressure or blood glucose at home?  yes   Does the patient have any problems obtaining medications due to transportation or finances?   no  Understanding of regimen: good Understanding of indications: good Potential of compliance: excellent Patient understands to avoid NSAIDs. Patient understands to avoid decongestants.  Issues to address at subsequent visits: None   Pharmacist comments: [name] is a pleasant 70 yo blind M presenting without a medication list. He receives care at [facility] who faxed [place] a list of his current medications and this was reconciled with our list. No major discrepancies noted.   Time with patient: 4 minutes Preparation and documentation time: 10 minutes Total time: 14 minutes

## 2015-09-03 NOTE — Progress Notes (Signed)
Patient ID: [name], male   DOB: [birth year], 70 y.o.   MRN: [id number]   PCP: [name] Cardiology: Dr. Sallyanne Kuster HF Cardiology: Dr. Aundra Dubin  70 yo medically complicated man with chronic diastolic CHF complicated by RV dysfunction, morbid obesity, CKD stage III, symptomatic sinus bradycardia s/p PPM and blindness.   He returns for HF follow up. Last visit he was instructed to take metolazone once a week. Overall continues to feel poorly. Ongoing cough. Weight at home 350-355 pounds. SOB with exertion.  Uses CPAP at night but having difficulty due to dyspnea.   +Orthopnea. Denies PND. Uses rolling walker. He has cut down on his fluid intake. He has cut back on frozen meals.  Medications are provided by [facility] every Saturday in a blister pack. [facility] attends [facility].  Lives at home alone but has friends that check on him. He has an Aide 3 times a week.   Labs (11/16): K 3.8, creatinine 2.24 Labs (07/28/2015): K 3.5 Creatinine 3.41  Labs (08/19/2015): K 4.3 Creatinine 1.67  Labs (08/29/2015: K 4.1 Creatinine 2.01  08/20/2015- CXR pulmonary vascular congestion.   PMH: 1. Chronic diastolic CHF with RV dysfunction: Echo (2/15) with EF 60%, mild LVH, mild aortic stenosis with mean gradient 18 mmHg, RV mildly dilated with mildly decreased systolic function.  2. Aortic stenosis: Mild.  3. Type II diabetes 4. LBBB 5. OSA: Uses CPAP.  6. Morbid obesity.  7. HTN 8. CAD: Nonobstructive.  [place] 2005 with 60% LAD stenosis. 9. 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  10. Atrial fibrillation: Paroxysmal.  None recently.   11. CKD stage III 12. Macular degeneration: Legally blind.  13. 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  14. 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 15. S/p left foot amputation 16. Diabetic peripheral neuropathy.   Social History   Social History  . Marital Status: Single    Spouse Name: N/A  . Number of Children: Y  . Years of  Education: N/A   Occupational History  . disabled    Social History Main Topics  . Smoking status: Former Smoker -- 0.00 packs/day for 0 years    Types: Cigarettes    Quit date: 08/17/2003  . Smokeless tobacco: Never Used     Comment: started at age 60.  only smoked on occ- very rarely.0  . Alcohol Use: Yes     Comment: 06/23/2013 "drank some; never had a problem w/it"  . Drug Use: No  . Sexual Activity: No   Other Topics Concern  . Lives with roommate.   Family History  Problem Relation Age of Onset  . Coronary artery disease Sister   . Heart disease Sister   . Allergies Sister   . Asthma Brother   . Rheum arthritis Brother    ROS: All systems reviewed and negative except as per HPI.  Current Outpatient Prescriptions  Medication Sig Dispense Refill  . acetaminophen (TYLENOL) 650 MG CR tablet Take 650 mg by mouth 2 (two) times daily.    Marland Kitchen albuterol (PROVENTIL HFA;VENTOLIN HFA) 108 (90 Base) MCG/ACT inhaler Inhale 2 puffs into the lungs every 6 (six) hours as needed for wheezing or shortness of breath.    . allopurinol (ZYLOPRIM) 100 MG tablet Take 100 mg by mouth daily.    Marland Kitchen amiodarone (PACERONE) 200 MG tablet Take 200 mg by mouth daily.    Marland Kitchen amLODipine (NORVASC) 10 MG tablet Take 10 mg by mouth daily.    Marland Kitchen  aspirin 325 MG tablet Take 325 mg by mouth daily.    Marland Kitchen atorvastatin (LIPITOR) 40 MG tablet Take 1 tablet (40 mg total) by mouth daily. 90 tablet 3  . Cholecalciferol (VITAMIN D-3) 1000 UNITS CAPS Take 1 capsule by mouth daily.    Marland Kitchen gabapentin (NEURONTIN) 300 MG capsule Take 300 mg by mouth 2 (two) times daily.    . hydrALAZINE (APRESOLINE) 25 MG tablet Take 25 mg by mouth 3 (three) times daily.    . insulin detemir (LEVEMIR) 100 UNIT/ML injection Inject 90 Units into the skin 2 (two) times daily.     . Liraglutide (VICTOZA) 18 MG/3ML SOPN Inject 1.8 mg into the skin daily.    . Melatonin 3 MG CAPS Take 1 capsule by mouth at bedtime as needed (sleep).     . Menthol,  Topical Analgesic, (BIOFREEZE EX) Apply 1 application topically daily as needed.    . methylcellulose (ARTIFICIAL TEARS) 1 % ophthalmic solution Place 1 drop into both eyes 2 (two) times daily.    . metolazone (ZAROXOLYN) 2.5 MG tablet Take 2.5 mg by mouth once a week. Takes on Saturdays    . metoprolol succinate (TOPROL-XL) 100 MG 24 hr tablet Take 100 mg by mouth daily. Take with or immediately following a meal.    . pantoprazole (PROTONIX) 40 MG tablet Take 40 mg by mouth daily.    . potassium chloride SA (K-DUR,KLOR-CON) 20 MEQ tablet Take 40 mEq by mouth daily.    . sennosides-docusate sodium (SENOKOT-S) 8.6-50 MG tablet Take 1 tablet by mouth daily as needed for constipation.    . Skin Protectants, Misc. (EUCERIN) cream Apply 1 application topically 2 (two) times daily.    Marland Kitchen torsemide (DEMADEX) 100 MG tablet Take 1 tablet (100 mg total) by mouth 2 (two) times daily. 60 tablet 6   Current Facility-Administered Medications  Medication Dose Route Frequency Provider Last Rate Last Dose  . furosemide (LASIX) injection 80 mg  80 mg Intravenous Once Amy D Clegg, NP       BP 116/78 mmHg  Pulse 87  Wt 351 lb 12.8 oz (159.575 kg)  SpO2 92% General: NAD, morbidly obese. Sitting in wheel chair. Dyspneic with exertion Neck: Thick, JVP difficult, no thyromegaly or thyroid nodule.  Lungs: Rhonchi throughout.  CV: Nondisplaced PMI.  Heart regular S1/S2, no S3/S4, 2/6 early SEM RUSB.  1-2+ chronic edema 1/2 to knees bilaterally.  No carotid bruit. Unable to palpate pedal pulses due to edema.  Abdomen: Obese, Soft, nontender, no hepatosplenomegaly, ++distention.  Skin: Intact without lesions or rashes.  Neurologic: Alert and oriented x 3.  Psych: Normal affect. Extremities: No clubbing or cyanosis. RLE coban compression wrap.  HEENT: Normal.   Assessment/Plan: 1. Acute/Chronic diastolic CHF with prominent RV failure: NYHA class IIIb symptoms.   -Volume status remains elevated but he is a difficult  exam. Give 80 mg IV lasix today in clinic--> 700cc urine output.  - For now I will continue continue metolazone once a week +  torsemide 100 mg bid.  I have asked him cut back fluid intake and limit the amount of frozen foods.  - Set up ECHO at next visit .   2. Aortic stenosis: Mild on last echo.   3. Atrial fibrillation: Paroxysmal.  Last episode was in 10/14 based on device interrogation.  He is not anticoagulated (takes ASA). Suspect that this is related to prior intracerebral hemorrhage. Regular rhythm today.  4. CAD: Nonobstructive.  No chest pain.  He is  on ASA and statin.  5. Bradycardia s/p Medtronic PPM: Recent interrogation showd 97% a-pacing, minimal v-pacing. Interrogate device next appointment 6. CKD: Stage III. BMET today.  Creatinine 2.36Check BMET next week by [facility].  7. OSA: Continue CPAP nightly.  Follow up in 2 weeks to reassess volume status. May need to consider RHC at next visit. Set up ECHO once volume status better controlled.   [name] NP-C 09/03/2015

## 2015-09-03 NOTE — Patient Instructions (Signed)
Follow up 2 weeks   Continue current medications  Check BMET next week by [facility] please.

## 2015-09-04 NOTE — Telephone Encounter (Signed)
Tried contacting patient after 2pm on 09/01/14 no answer.... Called again this morning still no answer left vm

## 2015-09-12 ENCOUNTER — Other Ambulatory Visit (HOSPITAL_COMMUNITY): Payer: Self-pay | Admitting: Cardiology

## 2015-09-16 NOTE — Progress Notes (Signed)
Patient ID: [name], male   DOB: 1945/11/26, 70 y.o.   MRN: [id number]   PCP: [name] Cardiology: Dr. Sallyanne Kuster HF Cardiology: Dr. Aundra Dubin  70 yo medically complicated man with chronic diastolic CHF complicated by RV dysfunction, morbid obesity, CKD stage III, symptomatic sinus bradycardia s/p PPM and blindness.   He returns for HF follow up. Last visit he was given 80 mg IV lasix with 700 cc urine output. He continued on torsemide twice a week and metolazone weekly. Weight at home has been 345-350 pounds. Has talking scale at home. Ongoing dyspnea with exertion. Ongoing orthopnea. Wears CPAP nightly  Ambulates with a rolling walker.  He has tried to cut back on frozen meals. Medications are provided by [facility] every Saturday in a blister pack. [facility] attends [facility].  Lives at home alone but has friends that check on him. He has an Aide 3 times a week.   Labs (11/16): K 3.8, creatinine 2.24 Labs (07/28/2015): K 3.5 Creatinine 3.41  Labs (08/19/2015): K 4.3 Creatinine 1.67  Labs (08/29/2015: K 4.1 Creatinine 2.01 Labs (1//18/2017): K 3.7 Creatinine 2.36 Labs (09/16/2015) : K 3.6 Creatinine 2.06    08/20/2015- CXR pulmonary vascular congestion.   PMH: 1. Chronic diastolic CHF with RV dysfunction: Echo (2/15) with EF 60%, mild LVH, mild aortic stenosis with mean gradient 18 mmHg, RV mildly dilated with mildly decreased systolic function.  2. Aortic stenosis: Mild.  3. Type II diabetes 4. LBBB 5. OSA: Uses CPAP.  6. Morbid obesity.  7. HTN 8. CAD: Nonobstructive.  [place] 2005 with 60% LAD stenosis. 9. 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  10. Atrial fibrillation: Paroxysmal.  None recently.   11. CKD stage III 12. Macular degeneration: Legally blind.  13. 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  14. 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 15. S/p left foot amputation 16. Diabetic peripheral neuropathy.   Social History   Social History  .  Marital Status: Single    Spouse Name: N/A  . Number of Children: Y  . Years of Education: N/A   Occupational History  . disabled    Social History Main Topics  . Smoking status: Former Smoker -- 0.00 packs/day for 0 years    Types: Cigarettes    Quit date: 08/17/2003  . Smokeless tobacco: Never Used     Comment: started at age 42.  only smoked on occ- very rarely.0  . Alcohol Use: Yes     Comment: 06/23/2013 "drank some; never had a problem w/it"  . Drug Use: No  . Sexual Activity: No   Other Topics Concern  . Lives with roommate.   Family History  Problem Relation Age of Onset  . Coronary artery disease Sister   . Heart disease Sister   . Allergies Sister   . Asthma Brother   . Rheum arthritis Brother    ROS: All systems reviewed and negative except as per HPI.  Current Outpatient Prescriptions  Medication Sig Dispense Refill  . acetaminophen (TYLENOL) 650 MG CR tablet Take 650 mg by mouth 2 (two) times daily.    Marland Kitchen albuterol (PROVENTIL HFA;VENTOLIN HFA) 108 (90 Base) MCG/ACT inhaler Inhale 2 puffs into the lungs every 6 (six) hours as needed for wheezing or shortness of breath.    . allopurinol (ZYLOPRIM) 100 MG tablet Take 100 mg by mouth daily.    Marland Kitchen amiodarone (PACERONE) 200 MG tablet Take 200 mg by mouth daily.    Marland Kitchen  amLODipine (NORVASC) 10 MG tablet Take 10 mg by mouth daily.    Marland Kitchen aspirin 325 MG tablet Take 325 mg by mouth daily.    Marland Kitchen atorvastatin (LIPITOR) 40 MG tablet Take 1 tablet (40 mg total) by mouth daily. 90 tablet 3  . Cholecalciferol (VITAMIN D-3) 1000 UNITS CAPS Take 1 capsule by mouth daily.    Marland Kitchen gabapentin (NEURONTIN) 300 MG capsule Take 300 mg by mouth 2 (two) times daily.    . hydrALAZINE (APRESOLINE) 25 MG tablet Take 25 mg by mouth 3 (three) times daily.    . insulin detemir (LEVEMIR) 100 UNIT/ML injection Inject 90 Units into the skin 2 (two) times daily.     . Liraglutide (VICTOZA) 18 MG/3ML SOPN Inject 1.8 mg into the skin daily.    . Melatonin 3  MG CAPS Take 1 capsule by mouth at bedtime as needed (sleep).     . Menthol, Topical Analgesic, (BIOFREEZE EX) Apply 1 application topically daily as needed.    . methylcellulose (ARTIFICIAL TEARS) 1 % ophthalmic solution Place 1 drop into both eyes 2 (two) times daily.    . metolazone (ZAROXOLYN) 2.5 MG tablet Take 2.5 mg by mouth once a week. Takes on Saturdays    . metoprolol succinate (TOPROL-XL) 100 MG 24 hr tablet Take 100 mg by mouth daily. Take with or immediately following a meal.    . pantoprazole (PROTONIX) 40 MG tablet Take 40 mg by mouth daily.    . potassium chloride SA (K-DUR,KLOR-CON) 20 MEQ tablet Take 40 mEq by mouth daily.    . sennosides-docusate sodium (SENOKOT-S) 8.6-50 MG tablet Take 1 tablet by mouth daily as needed for constipation.    . Skin Protectants, Misc. (EUCERIN) cream Apply 1 application topically 2 (two) times daily.    Marland Kitchen torsemide (DEMADEX) 100 MG tablet Take 1 tablet (100 mg total) by mouth 2 (two) times daily. 60 tablet 6   No current facility-administered medications for this encounter.   BP 136/82 mmHg  Pulse 77  Wt 351 lb (159.213 kg)  SpO2 93% General: NAD, morbidly obese. Walked in the clinic with a rolling walker.   Neck: Thick, JVP difficult, no thyromegaly or thyroid nodule.  Lungs: Decreased in the bases. .  CV: Nondisplaced PMI.  Heart regular S1/S2, no S3/S4, 2/6 early SEM RUSB.  No carotid bruit. Abdomen: Obese, Soft, nontender, no hepatosplenomegaly, ++distention.  Skin: Intact without lesions or rashes.  Neurologic: Alert and oriented x 3.  Psych: Normal affect. Extremities: No clubbing or cyanosis. RLE  And LLE trace-1+ edema.   HEENT: Normal.   Assessment/Plan: 1. Chronic diastolic CHF with prominent RV failure: NYHA class IIIb symptoms.   -Functionally better today and able to ambulate in the clinic. Volume status seems stable.   For now I will continue continue metolazone once a week +  torsemide 100 mg bid. Set up ECHO at next  visit .  2. Aortic stenosis: Mild on last echo.   3. Atrial fibrillation: Paroxysmal.  Last episode was in 10/14 based on device interrogation.  He is not anticoagulated (takes ASA). Suspect that this is related to prior intracerebral hemorrhage. Regular rhythm today.  4. CAD: Nonobstructive.  No chest pain.  He is on ASA and statin.  5. Bradycardia s/p Medtronic PPM:  6. CKD: Stage III: BMET from last week ok. Creatinine 2.06. Need to refer to nephrology.    7. OSA: Continue CPAP nightly.  Follow up 3 weeks with Dr Precious Bard NP-C 09/17/2015

## 2015-09-17 ENCOUNTER — Ambulatory Visit (HOSPITAL_COMMUNITY)
Admission: RE | Admit: 2015-09-17 | Discharge: 2015-09-17 | Disposition: A | Discharge status: Home / Self Care | Payer: Medicare (Managed Care) | Source: Ambulatory Visit | Attending: Internal Medicine | Admitting: Internal Medicine

## 2015-09-17 VITALS — BP 136/82 | HR 77 | Wt 351.0 lb

## 2015-09-17 DIAGNOSIS — G4733 Obstructive sleep apnea (adult) (pediatric): Secondary | ICD-10-CM | POA: Insufficient documentation

## 2015-09-17 DIAGNOSIS — I350 Nonrheumatic aortic (valve) stenosis: Secondary | ICD-10-CM | POA: Diagnosis not present

## 2015-09-17 DIAGNOSIS — R001 Bradycardia, unspecified: Secondary | ICD-10-CM | POA: Diagnosis not present

## 2015-09-17 DIAGNOSIS — I2510 Atherosclerotic heart disease of native coronary artery without angina pectoris: Secondary | ICD-10-CM | POA: Insufficient documentation

## 2015-09-17 DIAGNOSIS — N183 Chronic kidney disease, stage 3 unspecified: Secondary | ICD-10-CM

## 2015-09-17 DIAGNOSIS — I480 Paroxysmal atrial fibrillation: Secondary | ICD-10-CM | POA: Insufficient documentation

## 2015-09-17 DIAGNOSIS — I5032 Chronic diastolic (congestive) heart failure: Secondary | ICD-10-CM | POA: Diagnosis not present

## 2015-09-17 NOTE — Patient Instructions (Signed)
Your physician recommends that you schedule a follow-up appointment in: 3 weeks with Dr.McLean  Do the following things EVERYDAY: 1) Weigh yourself in the morning before breakfast. Write it down and keep it in a log. 2) Take your medicines as prescribed 3) Eat low salt foods-Limit salt (sodium) to 2000 mg per day.  4) Stay as active as you can everyday 5) Limit all fluids for the day to less than 2 liters 6)

## 2015-09-17 NOTE — Progress Notes (Signed)
Advanced Heart Failure Medication Review by a Pharmacist  Does the patient  feel that his/her medications are working for him/her?  yes  Has the patient been experiencing any side effects to the medications prescribed?  no  Does the patient measure his/her own blood pressure or blood glucose at home?  yes   Does the patient have any problems obtaining medications due to transportation or finances?   no  Understanding of regimen: good Understanding of indications: good Potential of compliance: good Patient understands to avoid NSAIDs. Patient understands to avoid decongestants.  Issues to address at subsequent visits: None   Pharmacist comments:  Mr. Moralas is a pleasant 70 yo M presenting from [facility]. They have faxed over his current medication list as well as recent labs. All medications were reconciled and no significant discrepancies were noted.   [name]. [name], PharmD, BCPS, CPP Clinical Pharmacist Pager: [phone] Phone: [phone] 09/17/2015 10:37 AM      Time with patient: 4 minutes Preparation and documentation time: 10 minutes Total time: 14 minutes

## 2015-09-23 IMAGING — CT CT FOOT*L* W/CM
2 of 8 series · 6 of 20 positions shown, 7 images · IV contrast (75CC OMNI 300)
Comparison: Foot radiographs 05/10/2013.

CLINICAL DATA: Plantar forefoot pain and ulceration. Evaluate for
osteomyelitis.

EXAM:
CT OF THE LEFT FOOT WITH CONTRAST
TECHNIQUE: Multidetector CT imaging was performed following the standard
protocol during bolus administration of intravenous contrast.
CONTRAST:  75mL OMNIPAQUE IOHEXOL 300 MG/ML  SOLN

[Series 2: ankle/foot bone · axial · 0.50mm/px · z∈[-158,+4]mm · 3 of 66 slices shown, 4 images]
[im 1/66  soft-tissue]
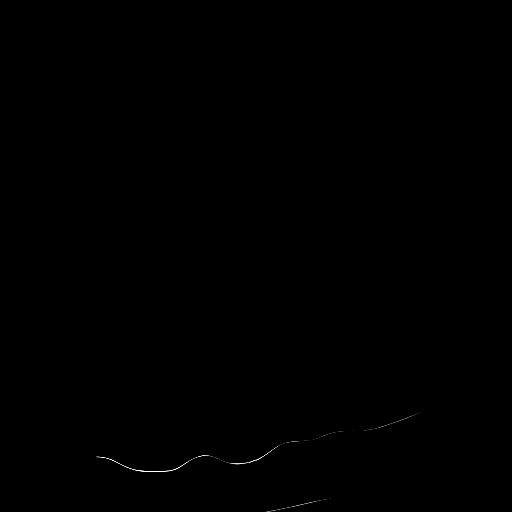
[im 1/66  bone]
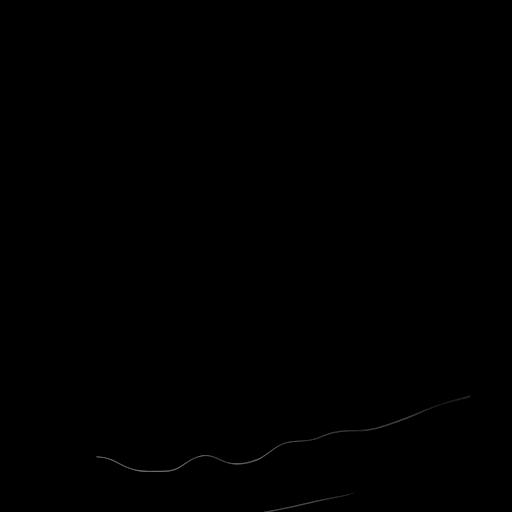
[im 33/66  bone]
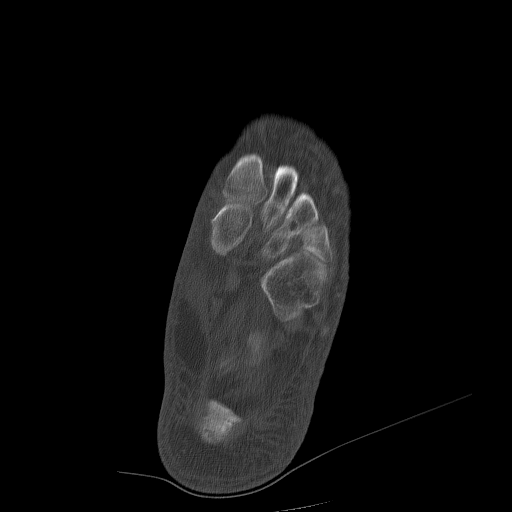
[im 66/66  bone]
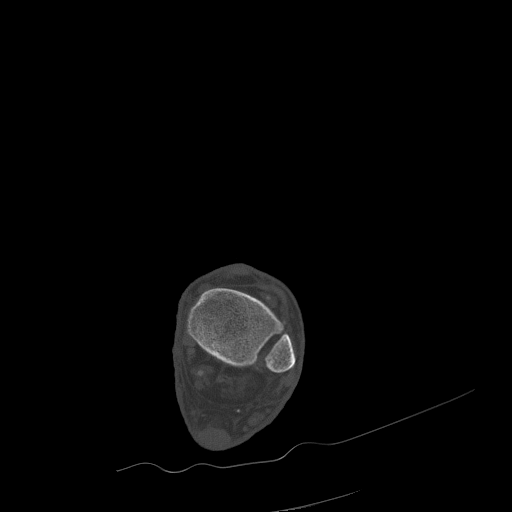

[Series 400: axial · coronal · 0.50mm/px · 3 of 128 slices shown]
[im 40/128  bone]
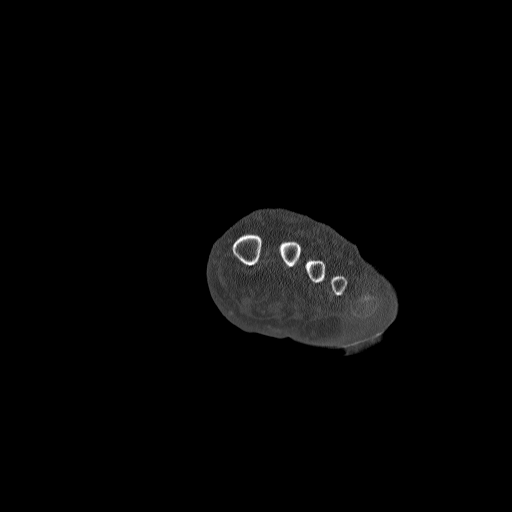
[im 78/128  bone]
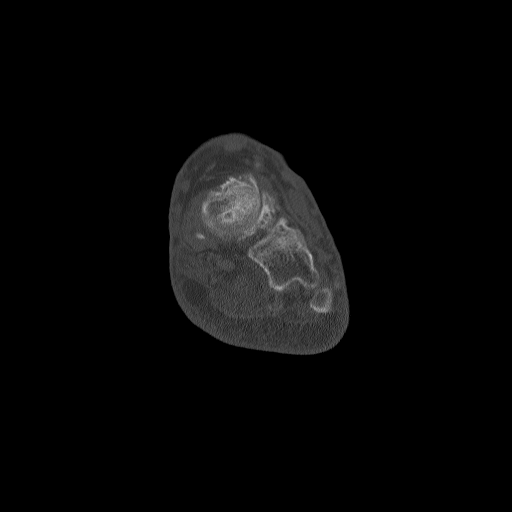
[im 116/128  bone]
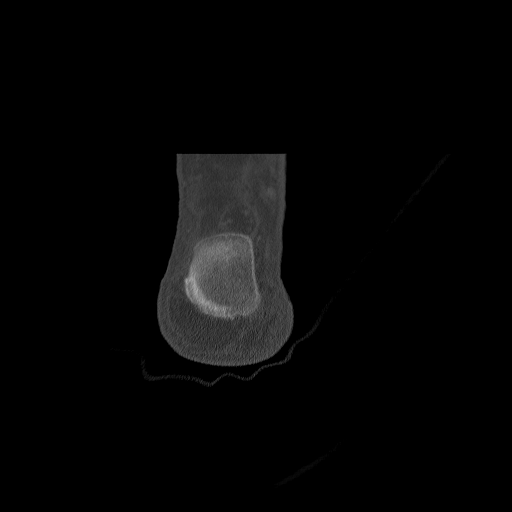

[6 of 20 positions shown; findings below may reference images not displayed]

FINDINGS: There is ill-defined increased density throughout the subcutaneous
fat plantar to the 5th metatarsophalangeal joint. The overlying skin
is irregular with hyperdensity, possibly related to associated
ointment or overlying bandages. No drainable fluid collection or
well-defined sinus tract is identified.

The bones are diffusely demineralized. There is concern of possible
early cortical erosion involving the plantar aspect of the 5th
metatarsal head, best seen on the sagittal images. Given the
adjacent soft tissue findings, early osteomyelitis cannot be
excluded.

No other bone destruction is identified. Mild degenerative changes
are present at the 1st metatarsal phalangeal joint and in the
fibular sesamoid of the 1st metatarsal . Midfoot degenerative
changes are present with subchondral cysts in the talar head,
navicular, medial cuneiform and 2nd metatarsal base. The alignment
at the Lisfranc joint is normal. There is mild calcaneal spurring.

Diffuse muscular atrophy is noted throughout the foot.
IMPRESSION: 1. Soft tissue changes plantar to the 5th metatarsophalangeal joint
with apparent overlying skin ulceration . No drainable abscess
identified.
2. Early osteomyelitis of the 5th metatarsal head cannot be
excluded. No other bone destruction identified. There are scattered
midfoot and forefoot degenerative changes as described.
3. Diffuse muscular atrophy.

## 2015-09-26 ENCOUNTER — Other Ambulatory Visit (HOSPITAL_COMMUNITY): Payer: Self-pay | Admitting: Cardiology

## 2015-10-07 ENCOUNTER — Encounter: Payer: Self-pay | Admitting: Cardiovascular Disease

## 2015-10-07 ENCOUNTER — Ambulatory Visit (INDEPENDENT_AMBULATORY_CARE_PROVIDER_SITE_OTHER): Payer: Medicare (Managed Care) | Admitting: Cardiovascular Disease

## 2015-10-07 VITALS — BP 126/82 | HR 76 | Ht 72.0 in | Wt 354.0 lb

## 2015-10-07 DIAGNOSIS — G4733 Obstructive sleep apnea (adult) (pediatric): Secondary | ICD-10-CM

## 2015-10-07 DIAGNOSIS — I480 Paroxysmal atrial fibrillation: Secondary | ICD-10-CM | POA: Diagnosis not present

## 2015-10-07 DIAGNOSIS — I43 Cardiomyopathy in diseases classified elsewhere: Secondary | ICD-10-CM

## 2015-10-07 DIAGNOSIS — I5032 Chronic diastolic (congestive) heart failure: Secondary | ICD-10-CM

## 2015-10-07 DIAGNOSIS — N183 Chronic kidney disease, stage 3 unspecified: Secondary | ICD-10-CM

## 2015-10-07 DIAGNOSIS — Z950 Presence of cardiac pacemaker: Secondary | ICD-10-CM

## 2015-10-07 DIAGNOSIS — I110 Hypertensive heart disease with heart failure: Secondary | ICD-10-CM | POA: Diagnosis not present

## 2015-10-07 DIAGNOSIS — I2510 Atherosclerotic heart disease of native coronary artery without angina pectoris: Secondary | ICD-10-CM

## 2015-10-07 DIAGNOSIS — I10 Essential (primary) hypertension: Secondary | ICD-10-CM

## 2015-10-07 MED ORDER — METOLAZONE 2.5 MG PO TABS: 2.5000 mg | ORAL_TABLET | ORAL | Status: DC

## 2015-10-07 NOTE — Patient Instructions (Signed)
Your physician has recommended you make the following change in your medication: INCREASE ZAROXOLYN 2.5 MG TO TWICE WEEKLY [facility] AND Friday).  Remote monitoring is used to monitor your Pacemaker from home. This monitoring reduces the number of office visits required to check your device to one time per year. It allows [place] to monitor the functioning of your device to ensure it is working properly. You are scheduled for a device check from home on Jan 05, 2016. You may send your transmission at any time that day. If you have a wireless device, the transmission will be sent automatically. After your physician reviews your transmission, you will receive a postcard with your next transmission date.  Dr. Sallyanne Kuster recommends that you schedule a follow-up appointment in: [place] ([email or web address])

## 2015-10-07 NOTE — Progress Notes (Signed)
Patient ID: [name], male   DOB: June 02, 1946, 71 y.o.   MRN: [id number]    Cardiology Office Note    Date:  10/07/2015   ID:  [name], DOB [birth year], MRN [id number]  PCP:  Sherian Maroon, MD  Cardiologist:   Sanda Klein, MD   Chief Complaint  Patient presents with  . Follow-up    had some chest tightness, has shortness of breath, has edema, has pain or cramping in legs and hands, no lightheadedness or dizziness    History of Present Illness:  [name] is a 70 y.o. male with long-standing diastolic heart failure, hypertensive heart disease, nonobstructive coronary artery disease by remote coronary angiography, moderate chronic kidney disease, obstructive sleep apnea, morbid obesity, type 2 diabetes mellitus complicated by blindness and severe peripheral neuropathy and recurrent poor wound healing, hyperlipidemia, hypertension who presents for pacemaker follow-up. It is important to note that he is blind and therefore requires assistance with transportation (uses [facility]) and has his medications prepacked in daily blister packs, prepared once weekly on Saturdays.  He is here for a pacemaker check. His device was implanted for symptomatic sinus bradycardia in 2005, with a generator change in 2014. He has 98% atrial pacing and only 4% ventricular pacing. At this point generator longevity is estimated to be roughly 10 years. He has not had any significant atrial fibrillation since his last device check and has not had high ventricular rates. He has chronically elevated right ventricular pacing thresholds which have not generally been of concern since he almost never requires ventricular pacing.  Despite his history of atrial fibrillation and stroke he is unable to receive anticoagulation due to a previous left parenchymal temporoparietal hematoma that required surgical evacuation. 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  He is extremely sedentary. He recently reports worsening dyspnea when he tries to lay down in bed. He is unsure if he has more than usual swelling in his legs. He denies angina pectoris. He has not had any new focal neurological deficits, but complains about severe stinging and numbness in his fingers and toes, described as a sensation of "frostbite".  Past Medical History  Diagnosis Date  . Diabetes type 2, uncontrolled ([place])   . Gout   . Hyperlipidemia   . Hypertension   . Obstructive sleep apnea     On CPAP. Last sleep study (06/2009) - Severe obstructive sleep apnea/hypopnea syndrome, apnea-hypopnea index 70.5 per hour with non positional events moderately loud snoring and oxygen desaturation to a nadir of 85% on room air.  . Benign prostatic hypertrophy   . Morbid obesity ([place])   . Cardiomyopathy, hypertensive ([place])      diastolic dysfunction by [id number] echo  . Coronary artery disease      nonobstructive. 2 vessel. cardiac catheter in March 2005 showing 60% LAD occlusion, 30% circumflex occlusion.  . 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 Legally blind   . Herpes   . Occult blood positive stool      History of in August 2007.  colonoscopy in January 2008 showing normal colon with fair inadequate prep. By Dr. Silvano Rusk.  . Incarcerated ventral hernia     H/O. S/P repair ventral hernia repair 07/2005.  . Diabetic ulcer of thigh ([name])     H/O Rt thigh ulcer.  . Diabetic peripheral neuropathy ([place])   . Exertional shortness of breath     "  comes and goes" (07/17/13)  . GERD (gastroesophageal reflux disease)   . Degenerative joint disease      Right knee.  . Arthritis     "all over" (07/17/2013)  . Sepsis ([place]) 08/26/2013  . Atrial fibrillation ([place])     not Candidate for Coumadin d/t rectal bleeding  . Hypotension 09/05/2013  . LBBB (left bundle branch block)     Past Surgical History  Procedure Laterality Date  . Pacemaker  lead revision  12/2003     likely microdislodgment  of right ventricular lead  . Ventral hernia repair  07/2005     S/P laparotomy, lysis of adhesions, repair of incarcerated ventral hernia with  partial omentectomy.. Performed by Dr. Marlou Starks.  . Hernia repair    . Cataract extraction, bilateral Bilateral   . Refractive surgery Bilateral   . Eye surgery    . 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 Amputation Left 06/22/2013    Procedure: AMPUTATION FOOT;  Surgeon: Newt Minion, MD;  Location: [facility];  Service: Orthopedics;  Laterality: Left;  Left Midfoot Amputation  . Insert / replace / remove pacemaker  12/01/03    Secondary to symptomatic bradycardia. DDDR pacemaker. By Dr. Rollene Fare.  . Insert / replace / remove pacemaker  November 30, 2012    "old pacemaker died; had new one put in" (07-17-13)  . Esophagogastroduodenoscopy N/A 10/12/2013    Procedure: ESOPHAGOGASTRODUODENOSCOPY (EGD);  Surgeon: Gwenyth Ober, MD;  Location: [facility];  Service: General;  Laterality: N/A;  bedside  . Peg placement N/A 10/12/2013    Procedure: PERCUTANEOUS ENDOSCOPIC GASTROSTOMY (PEG) PLACEMENT;  Surgeon: Gwenyth Ober, MD;  Location: [facility];  Service: General;  Laterality: N/A;  . Venous doppler  01/17/2013    No evidence of thrombus or thrombophlebitis. Left GSVs appear enlarged and demonstrate valvular insuffiiciency. Bilateral SSVs appeared patent with no valvular insufficiency seen.  . Cardiac catheterization  10/22/2003    Dilatation of the LV with preserved LV systolic function. Tortous aorta, but no AAA. Moderate disease with 50-60% area of narrowing in the circumflex after the second OM and 70% mid narrowing in the LAD. No intervention.  . Cardiovascular stress test  01/23/2010    Perfusion defect seen in inferior myocardial region is consistent with diaphragmatic attenuation. Remaining myocardium demonstrates normal myocardial perfusion with no evidence of ischemia or  infarct. Stress ECG was non-diagnostic due to LBBB.  . Transthoracic echocardiogram  01/01/2013    EF 50-55%, ventricular septum-thickness moderately increased  . Permanent pacemaker generator change N/A 11/03/2012    Procedure: PERMANENT PACEMAKER GENERATOR CHANGE;  Surgeon: Sanda Klein, MD;  Location: [facility];  Service: Cardiovascular;  Laterality: N/A;    Outpatient Prescriptions Prior to Visit  Medication Sig Dispense Refill  . acetaminophen (TYLENOL) 650 MG CR tablet Take 650 mg by mouth 2 (two) times daily.    Marland Kitchen albuterol (PROVENTIL HFA;VENTOLIN HFA) 108 (90 Base) MCG/ACT inhaler Inhale 2 puffs into the lungs every 6 (six) hours as needed for wheezing or shortness of breath.    . allopurinol (ZYLOPRIM) 100 MG tablet Take 100 mg by mouth daily.    Marland Kitchen amiodarone (PACERONE) 200 MG tablet Take 200 mg by mouth daily.    Marland Kitchen amLODipine (NORVASC) 10 MG tablet Take 10 mg by mouth daily.    Marland Kitchen aspirin 325 MG tablet Take 325 mg by mouth daily.    Marland Kitchen atorvastatin (LIPITOR) 40 MG tablet Take 1 tablet (40 mg total)  by mouth daily. 90 tablet 3  . Cholecalciferol (VITAMIN D-3) 1000 UNITS CAPS Take 1 capsule by mouth daily.    Marland Kitchen gabapentin (NEURONTIN) 300 MG capsule Take 300 mg by mouth 2 (two) times daily.    . hydrALAZINE (APRESOLINE) 25 MG tablet Take 25 mg by mouth 3 (three) times daily.    . insulin detemir (LEVEMIR) 100 UNIT/ML injection Inject 90 Units into the skin 2 (two) times daily.     Marland Kitchen lidocaine (LIDODERM) 5 % Place 1 patch onto the skin daily as needed (back pain). Remove & Discard patch within 12 hours or as directed by MD    . Liraglutide (VICTOZA) 18 MG/3ML SOPN Inject 1.8 mg into the skin daily.    . Melatonin 3 MG CAPS Take 1 capsule by mouth at bedtime as needed (sleep).     . Menthol, Topical Analgesic, (BIOFREEZE EX) Apply 1 application topically daily as needed (pain).     . methylcellulose (ARTIFICIAL TEARS) 1 % ophthalmic solution Place 1 drop into both eyes 2 (two) times  daily.    . metoprolol succinate (TOPROL-XL) 100 MG 24 hr tablet Take 100 mg by mouth daily. Take with or immediately following a meal.    . pantoprazole (PROTONIX) 40 MG tablet Take 40 mg by mouth daily.    . potassium chloride SA (K-DUR,KLOR-CON) 20 MEQ tablet Take 40 mEq by mouth daily.    . sennosides-docusate sodium (SENOKOT-S) 8.6-50 MG tablet Take 1 tablet by mouth daily as needed for constipation.    . Skin Protectants, Misc. (EUCERIN) cream Apply 1 application topically 2 (two) times daily.    Marland Kitchen torsemide (DEMADEX) 100 MG tablet Take 1 tablet (100 mg total) by mouth 2 (two) times daily. 60 tablet 6  . metolazone (ZAROXOLYN) 2.5 MG tablet Take 2.5 mg by mouth 2 (two) times a week.     No facility-administered medications prior to visit.     Allergies:   Review of patient's allergies indicates no known allergies.   Social History   Social History  . Marital Status: Single    Spouse Name: N/A  . Number of Children: Y  . Years of Education: N/A   Occupational History  . disabled    Social History Main Topics  . Smoking status: Former Smoker -- 0.00 packs/day for 0 years    Types: Cigarettes    Quit date: 08/17/2003  . Smokeless tobacco: Never Used     Comment: started at age 33.  only smoked on occ- very rarely.0  . Alcohol Use: Yes     Comment: 06/23/2013 "drank some; never had a problem w/it"  . Drug Use: No  . Sexual Activity: No   Other Topics Concern  . None   Social History Narrative     Family History:  The patient's family history includes Allergies in his sister; Asthma in his brother; Coronary artery disease in his sister; Heart disease in his sister; Rheum arthritis in his brother.   ROS:   Please see the history of present illness.    ROS All other systems reviewed and are negative.   PHYSICAL EXAM:   VS:  BP 126/82 mmHg  Pulse 76  Ht 6' (1.829 m)  Wt 160.573 kg (354 lb)  BMI 48.00 kg/m2   General: Alert, oriented x3, no distress; morbid obesity  interferes with the physical exam, blind Head: no evidence of trauma, no myxedema, no xanthelasma; normal ears, nose and oropharynx Neck: Unable to see jugular venous pulsations;  brisk carotid pulses without delay and no carotid bruits Chest: clear to auscultation, no signs of consolidation by percussion or palpation, normal fremitus, symmetrical and full respiratory excursions Cardiovascular: Unable to locate the apical impulse, regular rhythm, normal first and second heart sounds, early peaking 1/6 aortic ejection murmur, no diastolic murmurs, rubs or gallops; healthy subclavian pacemaker site Abdomen: no tenderness or distention, no masses by palpation, no abnormal pulsatility or arterial bruits, normal bowel sounds, no hepatosplenomegaly Extremities: no clubbing, cyanosis; chronic brawny edema of the ankles and calves bilaterally, 3+; 2+ radial, ulnar and brachial pulses bilaterally; unable to palpate the lower extremity pulses; no subclavian or femoral bruits Neurological: grossly nonfocal Psych: euthymic mood, full affect  Wt Readings from Last 3 Encounters:  10/07/15 160.573 kg (354 lb)  09/17/15 159.213 kg (351 lb)  09/03/15 159.575 kg (351 lb 12.8 oz)      Studies/Labs Reviewed:   EKG:  EKG is ordered today.  The ekg ordered today demonstrates atrial paced, ventricular sensed rhythm with QS pattern in leads V1-V2  Recent Labs: 09/03/2015: B Natriuretic Peptide 27.2; BUN 46*; Creatinine, Ser 2.36*; Potassium 3.7; Sodium 136   Lipid Panel    Component Value Date/Time   CHOL 134 10/05/2013 0500   TRIG 111 10/29/2013 0611   HDL 37* 10/05/2013 0500   CHOLHDL 3.6 10/05/2013 0500   VLDL 30 10/05/2013 0500   LDLCALC 67 10/05/2013 0500    Additional studies/ records that were reviewed today include:  Heart Failure clinic notes    ASSESSMENT:    1. Pacemaker   2. Paroxysmal atrial fibrillation (HCC)   3. Chronic diastolic CHF (congestive heart failure) (HCC)   4.  Cardiomyopathy, hypertensive, with heart failure ([place])   5. Coronary artery disease involving native coronary artery of native heart without angina pectoris   6. Essential hypertension   7. OSA (obstructive sleep apnea)   8. CKD (chronic kidney disease) stage 3, GFR 30-59 ml/min   9. Morbid obesity, unspecified obesity type ([place])      PLAN:  In order of problems listed above:  1. PPM: Normal device function. We will try to see if he is able to do remote downloads. This is obviously a challenge since he is blind, but he was able to go through the procedure today. We have a backup pacemaker check in the office scheduled for 6 months if he cannot do the remote downloads. 2. PAF: He has not had any episodes of atrial fibrillation since his last device check. He cannot be anticoagulated due to history of intracerebral hemorrhage 3. CHF: He seems to be describing some degree of orthopnea. Assessment of on status by physical exam is seriously limited by morbid obesity. He weighs roughly 3 pounds more than he did at his last heart failure clinic appointment and 4-9 pounds more than his previous estimated "dry weight". I have asked him to increase his metolazone to twice weekly (Saturdays and Wednesdays) but this change will not take place until next week since he has pre-loaded blister packs with medications. He does not appear to be in respiratory distress today. Need to balance need for diuresis with renal dysfunction concerns.. 4. His heart failure is primarily due to hypertensive heart disease, less likely to have significant contribution of coronary insufficiency 5. CAD: He does not have angina pectoris. Had nonobstructive lesions by last coronary angiogram 6. HTN: Blood pressure is well controlled 7. OSA: Reports compliance with CPAP 8. CKD: Needs frequent monitoring of renal parameters especially when  diuretics are adjusted 9. Obesity: His obesity is probably playing a big part in his heart  failure and sleep apnea    Medication Adjustments/Labs and Tests Ordered: Current medicines are reviewed at length with the patient today.  Concerns regarding medicines are outlined above.  Medication changes, Labs and Tests ordered today are listed in the Patient Instructions below. Patient Instructions  Your physician has recommended you make the following change in your medication: INCREASE ZAROXOLYN 2.5 MG TO TWICE WEEKLY [facility] AND Friday).  Remote monitoring is used to monitor your Pacemaker from home. This monitoring reduces the number of office visits required to check your device to one time per year. It allows [place] to monitor the functioning of your device to ensure it is working properly. You are scheduled for a device check from home on Jan 05, 2016. You may send your transmission at any time that day. If you have a wireless device, the transmission will be sent automatically. After your physician reviews your transmission, you will receive a postcard with your next transmission date.  Dr. Sallyanne Kuster recommends that you schedule a follow-up appointment in: [place] ([facility])           Mikael Spray, MD  10/07/2015 3:33 PM    [address], [place], [place]  [address] Phone: [phone]; Fax: [phone]

## 2015-10-08 ENCOUNTER — Encounter (HOSPITAL_COMMUNITY): Payer: Self-pay

## 2015-10-08 ENCOUNTER — Ambulatory Visit (HOSPITAL_COMMUNITY)
Admission: RE | Admit: 2015-10-08 | Discharge: 2015-10-08 | Disposition: A | Discharge status: Home / Self Care | Payer: Medicare (Managed Care) | Source: Ambulatory Visit | Attending: Cardiology | Admitting: Cardiology

## 2015-10-08 VITALS — BP 140/75 | HR 77 | Resp 20 | Wt 354.0 lb

## 2015-10-08 DIAGNOSIS — H548 Legal blindness, as defined in USA: Secondary | ICD-10-CM | POA: Insufficient documentation

## 2015-10-08 DIAGNOSIS — Z7982 Long term (current) use of aspirin: Secondary | ICD-10-CM | POA: Diagnosis not present

## 2015-10-08 DIAGNOSIS — Z89432 Acquired absence of left foot: Secondary | ICD-10-CM | POA: Insufficient documentation

## 2015-10-08 DIAGNOSIS — I350 Nonrheumatic aortic (valve) stenosis: Secondary | ICD-10-CM | POA: Insufficient documentation

## 2015-10-08 DIAGNOSIS — Z794 Long term (current) use of insulin: Secondary | ICD-10-CM | POA: Insufficient documentation

## 2015-10-08 DIAGNOSIS — G4733 Obstructive sleep apnea (adult) (pediatric): Secondary | ICD-10-CM | POA: Diagnosis not present

## 2015-10-08 DIAGNOSIS — I2510 Atherosclerotic heart disease of native coronary artery without angina pectoris: Secondary | ICD-10-CM | POA: Diagnosis not present

## 2015-10-08 DIAGNOSIS — R001 Bradycardia, unspecified: Secondary | ICD-10-CM | POA: Diagnosis not present

## 2015-10-08 DIAGNOSIS — N183 Chronic kidney disease, stage 3 (moderate): Secondary | ICD-10-CM | POA: Diagnosis not present

## 2015-10-08 DIAGNOSIS — I480 Paroxysmal atrial fibrillation: Secondary | ICD-10-CM

## 2015-10-08 DIAGNOSIS — H3530 Unspecified macular degeneration: Secondary | ICD-10-CM | POA: Diagnosis not present

## 2015-10-08 DIAGNOSIS — I130 Hypertensive heart and chronic kidney disease with heart failure and stage 1 through stage 4 chronic kidney disease, or unspecified chronic kidney disease: Secondary | ICD-10-CM | POA: Diagnosis not present

## 2015-10-08 DIAGNOSIS — Z950 Presence of cardiac pacemaker: Secondary | ICD-10-CM | POA: Insufficient documentation

## 2015-10-08 DIAGNOSIS — Z79899 Other long term (current) drug therapy: Secondary | ICD-10-CM | POA: Insufficient documentation

## 2015-10-08 DIAGNOSIS — Z87891 Personal history of nicotine dependence: Secondary | ICD-10-CM | POA: Insufficient documentation

## 2015-10-08 DIAGNOSIS — Z8249 Family history of ischemic heart disease and other diseases of the circulatory system: Secondary | ICD-10-CM | POA: Insufficient documentation

## 2015-10-08 DIAGNOSIS — E1122 Type 2 diabetes mellitus with diabetic chronic kidney disease: Secondary | ICD-10-CM | POA: Insufficient documentation

## 2015-10-08 DIAGNOSIS — I5032 Chronic diastolic (congestive) heart failure: Secondary | ICD-10-CM | POA: Diagnosis present

## 2015-10-08 DIAGNOSIS — E1142 Type 2 diabetes mellitus with diabetic polyneuropathy: Secondary | ICD-10-CM | POA: Insufficient documentation

## 2015-10-08 LAB — BASIC METABOLIC PANEL
ANION GAP: 12 (ref 5–15)
BUN: 33 mg/dL — AB (ref 6–20)
CHLORIDE: 98 mmol/L — AB (ref 101–111)
CO2: 28 mmol/L (ref 22–32)
Calcium: 9.4 mg/dL (ref 8.9–10.3)
Creatinine, Ser: 1.95 mg/dL — ABNORMAL HIGH (ref 0.61–1.24)
GFR calc Af Amer: 39 mL/min — ABNORMAL LOW (ref >60)
GFR, EST NON AFRICAN AMERICAN: 34 mL/min — AB (ref >60)
GLUCOSE: 209 mg/dL — AB (ref 65–99)
POTASSIUM: 3.7 mmol/L (ref 3.5–5.1)
Sodium: 138 mmol/L (ref 135–145)

## 2015-10-08 MED ORDER — METOLAZONE 2.5 MG PO TABS: 2.5000 mg | ORAL_TABLET | ORAL | Status: DC

## 2015-10-08 NOTE — Patient Instructions (Signed)
INCREASE metolazone to 2.5 mg tablet THREE times weekly.  Will refer you to [facility].  Follow up 2 weeks with echocardiogram.  Do the following things EVERYDAY: 1) Weigh yourself in the morning before breakfast. Write it down and keep it in a log. 2) Take your medicines as prescribed 3) Eat low salt foods-Limit salt (sodium) to 2000 mg per day.  4) Stay as active as you can everyday 5) Limit all fluids for the day to less than 2 liters

## 2015-10-09 NOTE — Progress Notes (Signed)
Patient ID: [name], male   DOB: 1946/06/21, 70 y.o.   MRN: [id number]   PCP: [name] Cardiology: Dr. Sallyanne Kuster HF Cardiology: Dr. Aundra Dubin  70 yo medically complicated man with chronic diastolic CHF complicated by RV dysfunction, morbid obesity, CKD stage III, symptomatic sinus bradycardia s/p PPM and blindness.   He returns for HF follow up. He is taking torsemide 100 mg bid and metolazone twice a week. Weight is up 3 lbs on our scales. Has talking scale at home. He remains short of breath with very short distances walking.  He wheezes.  Has significant orthopnea and finds it hard to get to sleep. Wears CPAP nightly.  Ambulates with a rolling walker.  No chest pain.  No lightheadedness or palpitations.  Trying to cut back on sodium. Medications are provided by [facility] every Saturday in a blister pack. [facility].  Lives at home alone but has friends that check on him. He has an Aide 3 times a week.   Labs (11/16): K 3.8, creatinine 2.24 Labs (07/28/2015): K 3.5 Creatinine 3.41  Labs (08/19/2015): K 4.3 Creatinine 1.67  Labs (08/29/2015: K 4.1 Creatinine 2.01 Labs (1//18/2017): K 3.7 Creatinine 2.36 Labs (09/16/2015) : K 3.6 Creatinine 2.06    PMH: 1. Chronic diastolic CHF with RV dysfunction: Echo (2/15) with EF 60%, mild LVH, mild aortic stenosis with mean gradient 18 mmHg, RV mildly dilated with mildly decreased systolic function.  2. Aortic stenosis: Mild.  3. Type II diabetes 4. LBBB 5. OSA: Uses CPAP.  6. Morbid obesity.  7. HTN 8. CAD: Nonobstructive.  [place] 2005 with 60% LAD stenosis. 9. 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  10. Atrial fibrillation: Paroxysmal.  None recently.   11. CKD stage III 12. Macular degeneration: Legally blind.  13. 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  14. 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 15. S/p left foot amputation 16. Diabetic peripheral neuropathy.   Social History   Social  History  . Marital Status: Single    Spouse Name: N/A  . Number of Children: Y  . Years of Education: N/A   Occupational History  . disabled    Social History Main Topics  . Smoking status: Former Smoker -- 0.00 packs/day for 0 years    Types: Cigarettes    Quit date: 08/17/2003  . Smokeless tobacco: Never Used     Comment: started at age 49.  only smoked on occ- very rarely.0  . Alcohol Use: Yes     Comment: 06/23/2013 "drank some; never had a problem w/it"  . Drug Use: No  . Sexual Activity: No   Other Topics Concern  . Lives with roommate.   Family History  Problem Relation Age of Onset  . Coronary artery disease Sister   . Heart disease Sister   . Allergies Sister   . Asthma Brother   . Rheum arthritis Brother    ROS: All systems reviewed and negative except as per HPI.  Current Outpatient Prescriptions  Medication Sig Dispense Refill  . acetaminophen (TYLENOL) 650 MG CR tablet Take 650 mg by mouth 2 (two) times daily.    Marland Kitchen albuterol (PROVENTIL HFA;VENTOLIN HFA) 108 (90 Base) MCG/ACT inhaler Inhale 2 puffs into the lungs every 6 (six) hours as needed for wheezing or shortness of breath.    . allopurinol (ZYLOPRIM) 100 MG tablet Take 100 mg by mouth daily.    Marland Kitchen amiodarone (PACERONE) 200 MG tablet Take 200 mg  by mouth daily.    Marland Kitchen amLODipine (NORVASC) 10 MG tablet Take 10 mg by mouth daily.    Marland Kitchen aspirin 325 MG tablet Take 325 mg by mouth daily.    Marland Kitchen atorvastatin (LIPITOR) 40 MG tablet Take 1 tablet (40 mg total) by mouth daily. 90 tablet 3  . Cholecalciferol (VITAMIN D-3) 1000 UNITS CAPS Take 1 capsule by mouth daily.    Marland Kitchen gabapentin (NEURONTIN) 300 MG capsule Take 300 mg by mouth 2 (two) times daily.    . hydrALAZINE (APRESOLINE) 25 MG tablet Take 25 mg by mouth 3 (three) times daily.    . insulin detemir (LEVEMIR) 100 UNIT/ML injection Inject 90 Units into the skin 2 (two) times daily.     Marland Kitchen lidocaine (LIDODERM) 5 % Place 1 patch onto the skin daily as needed (back  pain). Remove & Discard patch within 12 hours or as directed by MD    . Liraglutide (VICTOZA) 18 MG/3ML SOPN Inject 1.8 mg into the skin daily.    . Melatonin 3 MG CAPS Take 1 capsule by mouth at bedtime as needed (sleep).     . Menthol, Topical Analgesic, (BIOFREEZE EX) Apply 1 application topically daily as needed (pain).     . methylcellulose (ARTIFICIAL TEARS) 1 % ophthalmic solution Place 1 drop into both eyes 2 (two) times daily.    . metolazone (ZAROXOLYN) 2.5 MG tablet Take 1 tablet (2.5 mg total) by mouth 3 (three) times a week. 15 tablet 11  . metoprolol succinate (TOPROL-XL) 100 MG 24 hr tablet Take 100 mg by mouth daily. Take with or immediately following a meal.    . pantoprazole (PROTONIX) 40 MG tablet Take 40 mg by mouth daily.    . potassium chloride SA (K-DUR,KLOR-CON) 20 MEQ tablet Take 40 mEq by mouth daily.    . sennosides-docusate sodium (SENOKOT-S) 8.6-50 MG tablet Take 1 tablet by mouth daily as needed for constipation.    . Skin Protectants, Misc. (EUCERIN) cream Apply 1 application topically 2 (two) times daily.    Marland Kitchen torsemide (DEMADEX) 100 MG tablet Take 1 tablet (100 mg total) by mouth 2 (two) times daily. 60 tablet 6   No current facility-administered medications for this encounter.   BP 140/75 mmHg  Pulse 77  Resp 20  Wt 354 lb (160.573 kg)  SpO2 94% General: NAD, morbidly obese. Walked in the clinic with a rolling walker.   Neck: Very thick, JVP difficult, no thyromegaly or thyroid nodule.  Lungs: Decreased in the bases.   CV: Nondisplaced PMI.  Heart regular S1/S2, no S3/S4, 2/6 early SEM RUSB with clear S2.  No carotid bruit. Abdomen: Obese, Soft, nontender, no hepatosplenomegaly, mild distention.  Skin: Intact without lesions or rashes.  Neurologic: Alert and oriented x 3.  Psych: Normal affect. Extremities: No clubbing or cyanosis. 2+ edema to knees bilaterally.    HEENT: Normal.   Assessment/Plan: 1. Chronic diastolic CHF with prominent RV failure:  NYHA class IV symptoms.  He is very limited.  Volume status difficult to ascertain with exam.  Weight is up some and he has prominent peripheral edema.  Probably still with elevated filling pressures.  Have to follow creatinine closely, cardiorenal picture.  - Will arrange for echo, due for repeat.  - I would like to get a better idea of his fluid status.  We discussed coming in for right heart cath. Hopefully, I could do this using brachial access which would make it relatively easy for him.  Getting a ride  to and from would be difficult.  He wants to try re-adjustment of his diuretic first.   - Continue torsemide 100 mg bid.  Increase metolazone to three times weekly for now.  I will bring him back in 2 wks to reassess. - BMET today and repeat BMET at followup in 2 wks.  2. Aortic stenosis: Mild on last echo.   3. Atrial fibrillation: Paroxysmal.  Last episode was in 10/14 based on device interrogation.  He is not anticoagulated (takes ASA) due to prior intracerebral hemorrhage. Regular rhythm today.  4. CAD: Nonobstructive.  No chest pain.  He is on ASA and statin.  5. Bradycardia s/p Medtronic PPM  6. CKD: Stage III.  BMET today and will refer to nephrology.     7. OSA: Continue CPAP nightly.  Loralie Champagne  10/09/2015

## 2015-10-17 ENCOUNTER — Encounter (HOSPITAL_COMMUNITY): Payer: Self-pay

## 2015-10-17 NOTE — Progress Notes (Signed)
[facility] referral faxed on behalf of patient with necessary medical records. Copy of referral form scanned into patient's electronic medical records.  Renee Pain

## 2015-10-27 ENCOUNTER — Ambulatory Visit (HOSPITAL_BASED_OUTPATIENT_CLINIC_OR_DEPARTMENT_OTHER)
Admission: RE | Admit: 2015-10-27 | Discharge: 2015-10-27 | Disposition: A | Discharge status: Home / Self Care | Payer: Medicare (Managed Care) | Source: Ambulatory Visit | Attending: Family Medicine | Admitting: Family Medicine

## 2015-10-27 ENCOUNTER — Ambulatory Visit (HOSPITAL_COMMUNITY)
Admission: RE | Admit: 2015-10-27 | Discharge: 2015-10-27 | Disposition: A | Discharge status: Home / Self Care | Payer: Medicare (Managed Care) | Source: Ambulatory Visit | Attending: Cardiology | Admitting: Cardiology

## 2015-10-27 ENCOUNTER — Encounter (HOSPITAL_COMMUNITY): Payer: Self-pay

## 2015-10-27 VITALS — BP 126/74 | HR 78 | Wt 345.8 lb

## 2015-10-27 DIAGNOSIS — Z87891 Personal history of nicotine dependence: Secondary | ICD-10-CM | POA: Insufficient documentation

## 2015-10-27 DIAGNOSIS — I350 Nonrheumatic aortic (valve) stenosis: Secondary | ICD-10-CM | POA: Diagnosis not present

## 2015-10-27 DIAGNOSIS — I110 Hypertensive heart disease with heart failure: Secondary | ICD-10-CM | POA: Diagnosis not present

## 2015-10-27 DIAGNOSIS — N183 Chronic kidney disease, stage 3 unspecified: Secondary | ICD-10-CM

## 2015-10-27 DIAGNOSIS — Z7982 Long term (current) use of aspirin: Secondary | ICD-10-CM | POA: Diagnosis not present

## 2015-10-27 DIAGNOSIS — Z79899 Other long term (current) drug therapy: Secondary | ICD-10-CM | POA: Diagnosis not present

## 2015-10-27 DIAGNOSIS — I5032 Chronic diastolic (congestive) heart failure: Secondary | ICD-10-CM

## 2015-10-27 DIAGNOSIS — E1122 Type 2 diabetes mellitus with diabetic chronic kidney disease: Secondary | ICD-10-CM | POA: Insufficient documentation

## 2015-10-27 DIAGNOSIS — I2510 Atherosclerotic heart disease of native coronary artery without angina pectoris: Secondary | ICD-10-CM | POA: Insufficient documentation

## 2015-10-27 DIAGNOSIS — E1142 Type 2 diabetes mellitus with diabetic polyneuropathy: Secondary | ICD-10-CM | POA: Diagnosis not present

## 2015-10-27 DIAGNOSIS — H548 Legal blindness, as defined in USA: Secondary | ICD-10-CM | POA: Diagnosis not present

## 2015-10-27 DIAGNOSIS — I480 Paroxysmal atrial fibrillation: Secondary | ICD-10-CM | POA: Diagnosis not present

## 2015-10-27 DIAGNOSIS — Z794 Long term (current) use of insulin: Secondary | ICD-10-CM | POA: Insufficient documentation

## 2015-10-27 DIAGNOSIS — R001 Bradycardia, unspecified: Secondary | ICD-10-CM | POA: Insufficient documentation

## 2015-10-27 DIAGNOSIS — Z8249 Family history of ischemic heart disease and other diseases of the circulatory system: Secondary | ICD-10-CM | POA: Diagnosis not present

## 2015-10-27 DIAGNOSIS — Z89432 Acquired absence of left foot: Secondary | ICD-10-CM | POA: Insufficient documentation

## 2015-10-27 DIAGNOSIS — I119 Hypertensive heart disease without heart failure: Secondary | ICD-10-CM | POA: Diagnosis not present

## 2015-10-27 DIAGNOSIS — I43 Cardiomyopathy in diseases classified elsewhere: Secondary | ICD-10-CM

## 2015-10-27 DIAGNOSIS — Z950 Presence of cardiac pacemaker: Secondary | ICD-10-CM | POA: Insufficient documentation

## 2015-10-27 DIAGNOSIS — I130 Hypertensive heart and chronic kidney disease with heart failure and stage 1 through stage 4 chronic kidney disease, or unspecified chronic kidney disease: Secondary | ICD-10-CM | POA: Insufficient documentation

## 2015-10-27 DIAGNOSIS — G4733 Obstructive sleep apnea (adult) (pediatric): Secondary | ICD-10-CM

## 2015-10-27 DIAGNOSIS — H3530 Unspecified macular degeneration: Secondary | ICD-10-CM | POA: Insufficient documentation

## 2015-10-27 LAB — ECHOCARDIOGRAM COMPLETE
AORTIC ROOT 2D: 37 mm
AV peak Index: 0.35
AV vel: 1.22
AVA: 1.22 cm2
AVG: 13 mmHg
AVPG: 23 mmHg
Ao pk vel: 0.27 m/s
CHL CUP AV VALUE AREA INDEX: 0.45
CHL CUP DOP CALC LVOT VTI: 17.3 cm
DOP CAL AO MEAN VELOCITY: 163 cm/s
FS: 33 % (ref 28–44)
IV/PV OW: 0.86
LA ID, A-P, ES: 38 mm
LA SIZE INDEX: 1.4 mm/m2
LA VOL 2D INDEX: 27.5 mL/m2
LA VOL 2D: 74.9 mL
LA vol: 76.2 mL
LAVOLIN: 28 mL/m2
LEFT ATRIUM END SYS DIAM: 38 mm
LV PW s: 19.5 mm
LV TDI E'LATERAL: 5.55 cm/s
LVIDD: 41.7 mm — AB (ref 3.5–6.0)
LVIDS: 27.9 mm — AB (ref 2.1–4.0)
LVOT MEAN VEL: 44.1 cm/s
LVOT SV INDEX: 22 mL/m2
LVOT area: 3.46 cm2
LVOT peak VTI: 0.35 cm
LVOT peak vel: 66.5 cm/s
LVOTD: 21 mm
LVOTSV: 60 mL
PW: 19.5 mm — AB (ref 0.6–1.1)
VTI: 49.1 cm

## 2015-10-27 LAB — BASIC METABOLIC PANEL
ANION GAP: 17 — AB (ref 5–15)
BUN: 75 mg/dL — AB (ref 6–20)
CHLORIDE: 87 mmol/L — AB (ref 101–111)
CO2: 30 mmol/L (ref 22–32)
Calcium: 9.6 mg/dL (ref 8.9–10.3)
Creatinine, Ser: 2.61 mg/dL — ABNORMAL HIGH (ref 0.61–1.24)
GFR calc Af Amer: 28 mL/min — ABNORMAL LOW (ref >60)
GFR calc non Af Amer: 24 mL/min — ABNORMAL LOW (ref >60)
GLUCOSE: 255 mg/dL — AB (ref 65–99)
POTASSIUM: 3 mmol/L — AB (ref 3.5–5.1)
Sodium: 134 mmol/L — ABNORMAL LOW (ref 135–145)

## 2015-10-27 NOTE — Patient Instructions (Addendum)
Follow up in 4-6 weeks

## 2015-10-27 NOTE — Progress Notes (Signed)
  Echocardiogram 2D Echocardiogram has been performed.  [name] 10/27/2015, 10:29 AM

## 2015-10-27 NOTE — Progress Notes (Signed)
Patient ID: [name], male   DOB: January 20, 1946, 70 y.o.   MRN: [id number]   PCP: [name] Cardiology: Dr. Sallyanne Kuster HF Cardiology: Dr. Aundra Dubin  70 yo medically complicated man with chronic diastolic CHF complicated by RV dysfunction, morbid obesity, CKD stage III, symptomatic sinus bradycardia s/p PPM and blindness.   He returns for HF follow up. He is taking torsemide 100 mg bid and metolazone three times a week. Ongoing mild dyspnea with exertion.  Wears CPAP nightly.  Ambulates with a rolling walker. Says he was able to walk without difficulty today.  No chest pain.  No lightheadedness or palpitations.  Trying to cut back on sodium. [facility]ations [facility] provided by [facility] every Saturday in a blister pack. [facility].  Lives at home alone but has friends that check on him. He has an Aide 3 times a week.   Labs (11/16): K 3.8, creatinine 2.24 Labs (07/28/2015): K 3.5 Creatinine 3.41  Labs (08/19/2015): K 4.3 Creatinine 1.67  Labs (08/29/2015: K 4.1 Creatinine 2.01 Labs (1//18/2017): K 3.7 Creatinine 2.36 Labs (09/16/2015) : K 3.6 Creatinine 2.06   Labs (10/08/2015): K 3.7 Creatinine 1.95   PMH: 1. Chronic diastolic CHF with RV dysfunction: Echo (2/15) with EF 60%, mild LVH, mild aortic stenosis with mean gradient 18 mmHg, RV mildly dilated with mildly decreased systolic function.  2. Aortic stenosis: Mild.  3. Type II diabetes 4. LBBB 5. OSA: Uses CPAP.  6. Morbid obesity.  7. HTN 8. CAD: Nonobstructive.  [place] 2005 with 60% LAD stenosis. 9. 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  10. Atrial fibrillation: Paroxysmal.  None recently.   11. CKD stage III 12. Macular degeneration: Legally blind.  13. 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  14. 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 15. S/p left foot amputation 16. Diabetic peripheral neuropathy.  17. ECHO EF 55-60%. Mild-mod AS  Social History   Social History  . Marital Status:  Single    Spouse Name: N/A  . Number of Children: Y  . Years of Education: N/A   Occupational History  . disabled    Social History Main Topics  . Smoking status: Former Smoker -- 0.00 packs/day for 0 years    Types: Cigarettes    Quit date: 08/17/2003  . Smokeless tobacco: Never Used     Comment: started at age 45.  only smoked on occ- very rarely.0  . Alcohol Use: Yes     Comment: 06/23/2013 "drank some; never had a problem w/it"  . Drug Use: No  . Sexual Activity: No   Other Topics Concern  . Lives with roommate.   Family History  Problem Relation Age of Onset  . Coronary artery disease Sister   . Heart disease Sister   . Allergies Sister   . Asthma Brother   . Rheum arthritis Brother    ROS: All systems reviewed and negative except as per HPI.  Current Outpatient Prescriptions  Medication Sig Dispense Refill  . acetaminophen (TYLENOL) 650 MG CR tablet Take 650 mg by mouth 2 (two) times daily.    Marland Kitchen albuterol (PROVENTIL HFA;VENTOLIN HFA) 108 (90 Base) MCG/ACT inhaler Inhale 2 puffs into the lungs every 6 (six) hours as needed for wheezing or shortness of breath.    . allopurinol (ZYLOPRIM) 100 MG tablet Take 100 mg by mouth daily.    Marland Kitchen amiodarone (PACERONE) 200 MG tablet Take 200 mg by mouth daily.    Marland Kitchen amLODipine (NORVASC)  10 MG tablet Take 10 mg by mouth daily.    Marland Kitchen aspirin 325 MG tablet Take 325 mg by mouth daily.    Marland Kitchen atorvastatin (LIPITOR) 40 MG tablet Take 1 tablet (40 mg total) by mouth daily. 90 tablet 3  . Cholecalciferol (VITAMIN D-3) 1000 UNITS CAPS Take 1 capsule by mouth daily.    Marland Kitchen gabapentin (NEURONTIN) 300 MG capsule Take 300 mg by mouth 2 (two) times daily.    . hydrALAZINE (APRESOLINE) 25 MG tablet Take 25 mg by mouth 3 (three) times daily.    . insulin detemir (LEVEMIR) 100 UNIT/ML injection Inject 90 Units into the skin 2 (two) times daily.     Marland Kitchen lidocaine (LIDODERM) 5 % Place 1 patch onto the skin daily as needed (back pain). Remove & Discard patch  within 12 hours or as directed by MD    . Liraglutide (VICTOZA) 18 MG/3ML SOPN Inject 1.8 mg into the skin daily.    . Melatonin 3 MG CAPS Take 1 capsule by mouth at bedtime as needed (sleep).     . methylcellulose (ARTIFICIAL TEARS) 1 % ophthalmic solution Place 1 drop into both eyes 2 (two) times daily.    . metolazone (ZAROXOLYN) 2.5 MG tablet Take 1 tablet (2.5 mg total) by mouth 3 (three) times a week. 15 tablet 11  . metoprolol succinate (TOPROL-XL) 100 MG 24 hr tablet Take 100 mg by mouth daily. Take with or immediately following a meal.    . pantoprazole (PROTONIX) 40 MG tablet Take 40 mg by mouth daily.    . potassium chloride SA (K-DUR,KLOR-CON) 20 MEQ tablet Take 40 mEq by mouth daily.    . sennosides-docusate sodium (SENOKOT-S) 8.6-50 MG tablet Take 1 tablet by mouth daily as needed for constipation.    . Skin Protectants, Misc. (EUCERIN) cream Apply 1 application topically 2 (two) times daily.    Marland Kitchen torsemide (DEMADEX) 100 MG tablet Take 1 tablet (100 mg total) by mouth 2 (two) times daily. 60 tablet 6  . Menthol, Topical Analgesic, (BIOFREEZE EX) Apply 1 application topically daily as needed (pain). Reported on 10/27/2015     No current facility-administered medications for this encounter.   BP 126/74 mmHg  Pulse 78  Wt 345 lb 12 oz (156.831 kg)  SpO2 97% General: NAD, morbidly obese. Walked in the clinic with a rolling walker.   Neck: Very thick, JVP difficult, no thyromegaly or thyroid nodule.  Lungs: Decreased in the bases.   CV: Nondisplaced PMI.  Heart regular S1/S2, no S3/S4, 2/6 early SEM RUSB with clear S2.  No carotid bruit. Abdomen: Obese, Soft, nontender, no hepatosplenomegaly, mild distention.  Skin: Intact without lesions or rashes.  Neurologic: Alert and oriented x 3.  Psych: Normal affect. Extremities: No clubbing or cyanosis. Trace lower extremity edema.     HEENT: Normal.   Assessment/Plan: 1. Chronic diastolic CHF with prominent RV failure: NYHA class III  symptoms.  Functionally a little better today. Volume status stable.  Continue torsemide 100 mg bid.  Continue metolazone to three times weekly for now.   2. Aortic stenosis: Mild-Mod on todays ECHO.    3. Atrial fibrillation: Paroxysmal.  Last episode was in 10/14 based on device interrogation.  He is not anticoagulated (takes ASA) due to prior intracerebral hemorrhage.  4. CAD: Nonobstructive.  No chest pain.  He is on ASA and statin.  5. Bradycardia s/p Medtronic PPM  6. CKD: Stage III.  BMET today and will refer to nephrology.  7. OSA: Continue CPAP nightly.  Follow 4-6  weeks with NP.   [name] NP-C  10/27/2015

## 2015-12-09 ENCOUNTER — Encounter (HOSPITAL_COMMUNITY): Payer: Self-pay

## 2015-12-09 ENCOUNTER — Ambulatory Visit (HOSPITAL_COMMUNITY)
Admission: RE | Admit: 2015-12-09 | Discharge: 2015-12-09 | Disposition: A | Discharge status: Home / Self Care | Payer: Medicare (Managed Care) | Source: Ambulatory Visit | Attending: Internal Medicine | Admitting: Internal Medicine

## 2015-12-09 VITALS — BP 126/65 | HR 88 | Resp 18 | Wt 348.0 lb

## 2015-12-09 DIAGNOSIS — Z7982 Long term (current) use of aspirin: Secondary | ICD-10-CM | POA: Diagnosis not present

## 2015-12-09 DIAGNOSIS — N183 Chronic kidney disease, stage 3 unspecified: Secondary | ICD-10-CM

## 2015-12-09 DIAGNOSIS — Z87891 Personal history of nicotine dependence: Secondary | ICD-10-CM | POA: Insufficient documentation

## 2015-12-09 DIAGNOSIS — E1122 Type 2 diabetes mellitus with diabetic chronic kidney disease: Secondary | ICD-10-CM | POA: Insufficient documentation

## 2015-12-09 DIAGNOSIS — Z794 Long term (current) use of insulin: Secondary | ICD-10-CM | POA: Diagnosis not present

## 2015-12-09 DIAGNOSIS — I480 Paroxysmal atrial fibrillation: Secondary | ICD-10-CM | POA: Diagnosis not present

## 2015-12-09 DIAGNOSIS — I2510 Atherosclerotic heart disease of native coronary artery without angina pectoris: Secondary | ICD-10-CM | POA: Insufficient documentation

## 2015-12-09 DIAGNOSIS — Z89432 Acquired absence of left foot: Secondary | ICD-10-CM | POA: Insufficient documentation

## 2015-12-09 DIAGNOSIS — H3530 Unspecified macular degeneration: Secondary | ICD-10-CM | POA: Diagnosis not present

## 2015-12-09 DIAGNOSIS — Z79899 Other long term (current) drug therapy: Secondary | ICD-10-CM | POA: Insufficient documentation

## 2015-12-09 DIAGNOSIS — H548 Legal blindness, as defined in USA: Secondary | ICD-10-CM | POA: Diagnosis not present

## 2015-12-09 DIAGNOSIS — I5032 Chronic diastolic (congestive) heart failure: Secondary | ICD-10-CM | POA: Insufficient documentation

## 2015-12-09 DIAGNOSIS — E1142 Type 2 diabetes mellitus with diabetic polyneuropathy: Secondary | ICD-10-CM | POA: Diagnosis not present

## 2015-12-09 DIAGNOSIS — R001 Bradycardia, unspecified: Secondary | ICD-10-CM | POA: Insufficient documentation

## 2015-12-09 DIAGNOSIS — I130 Hypertensive heart and chronic kidney disease with heart failure and stage 1 through stage 4 chronic kidney disease, or unspecified chronic kidney disease: Secondary | ICD-10-CM | POA: Diagnosis not present

## 2015-12-09 DIAGNOSIS — Z8249 Family history of ischemic heart disease and other diseases of the circulatory system: Secondary | ICD-10-CM | POA: Diagnosis not present

## 2015-12-09 DIAGNOSIS — G4733 Obstructive sleep apnea (adult) (pediatric): Secondary | ICD-10-CM | POA: Diagnosis not present

## 2015-12-09 DIAGNOSIS — Z950 Presence of cardiac pacemaker: Secondary | ICD-10-CM | POA: Insufficient documentation

## 2015-12-09 DIAGNOSIS — I350 Nonrheumatic aortic (valve) stenosis: Secondary | ICD-10-CM | POA: Insufficient documentation

## 2015-12-09 DIAGNOSIS — Z825 Family history of asthma and other chronic lower respiratory diseases: Secondary | ICD-10-CM | POA: Diagnosis not present

## 2015-12-09 IMAGING — US US SCROTUM
1 series · 13 of 25 positions shown · non-contrast
Comparison: None.

CLINICAL DATA: Edematous and erythematous scrotum. Current history
of diabetes.

EXAM:
SCROTAL ULTRASOUND
DOPPLER ULTRASOUND OF THE TESTICLES
TECHNIQUE: Complete ultrasound examination of the testicles, epididymis, and
other scrotal structures was performed. Color and spectral Doppler
ultrasound were also utilized to evaluate blood flow to the
testicles.

[Series 1: us scrotum · 0.10mm/px · 13 of 54 slices shown]
[im 1/54]
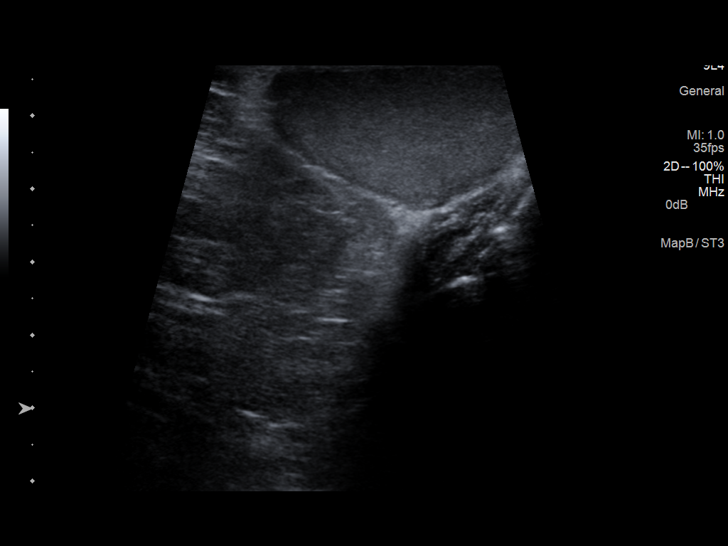
[im 5/54]
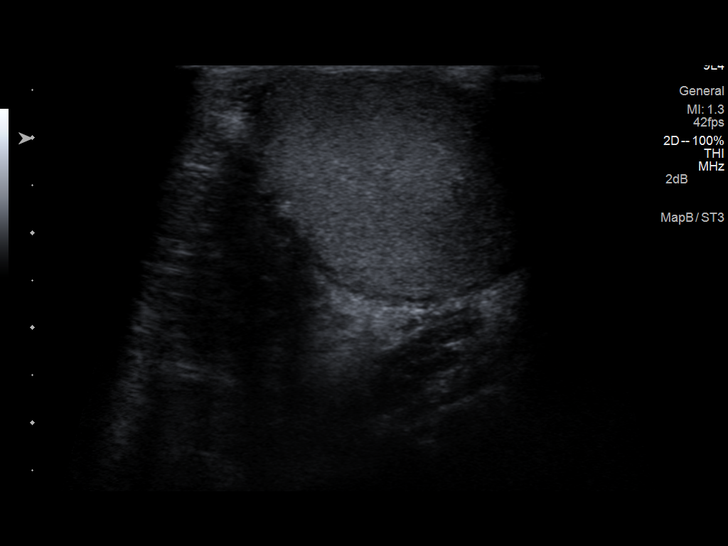
[im 9/54]
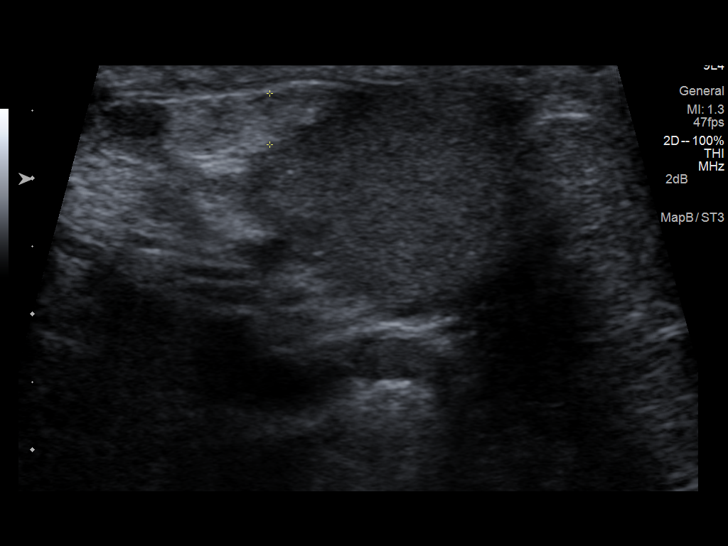
[im 14/54]
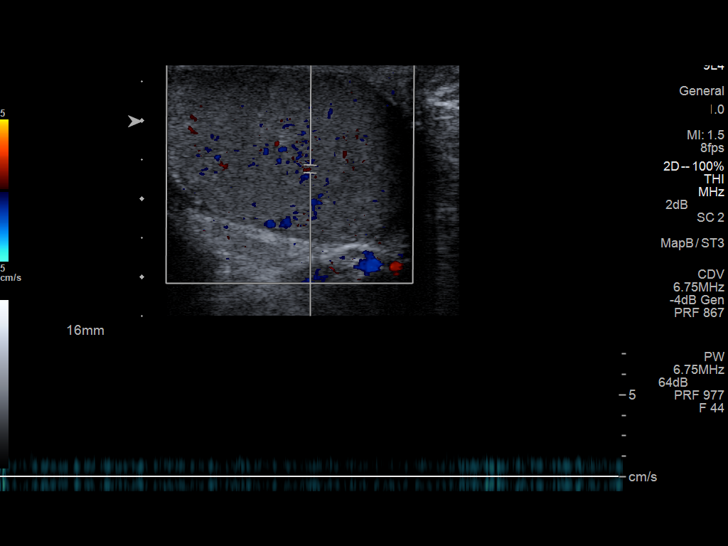
[im 18/54]
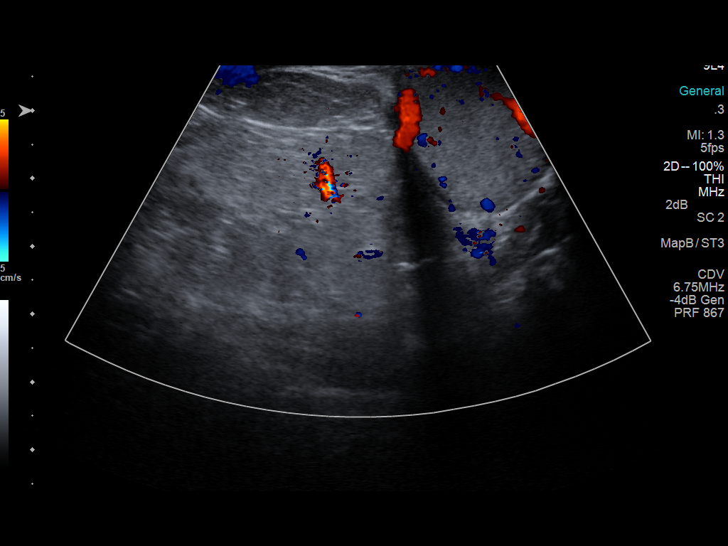
[im 23/54]
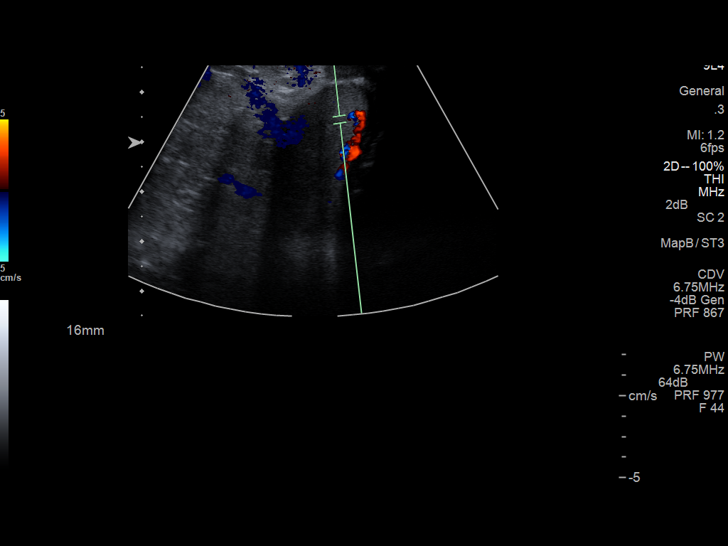
[im 27/54]
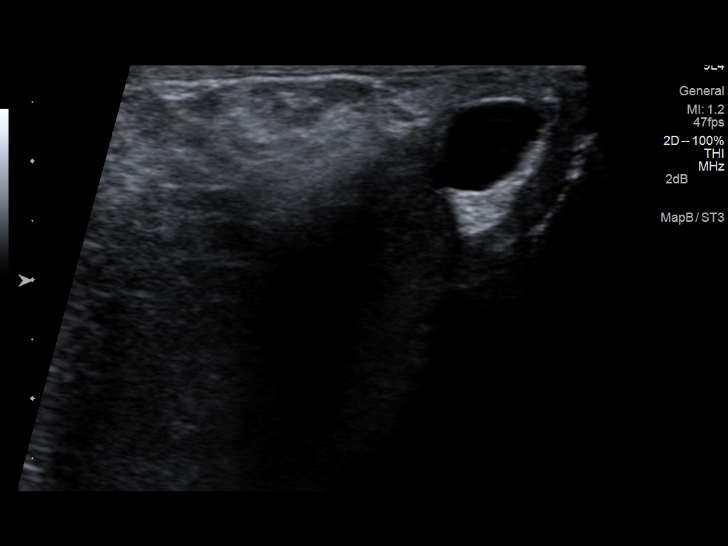
[im 31/54]
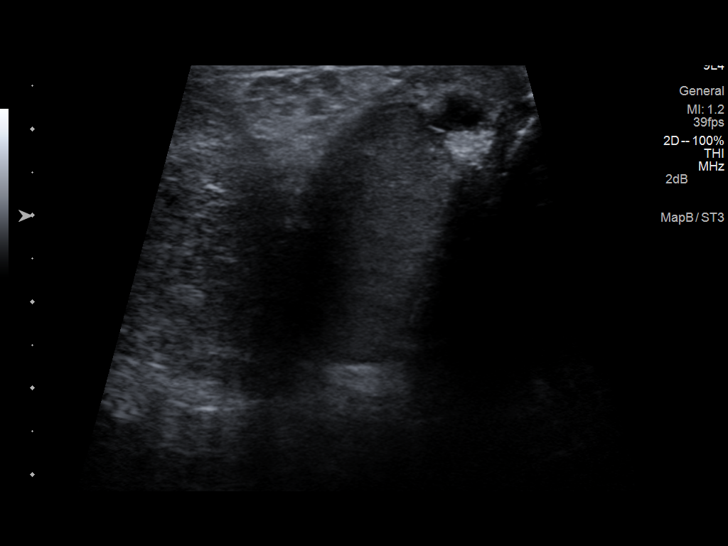
[im 36/54]
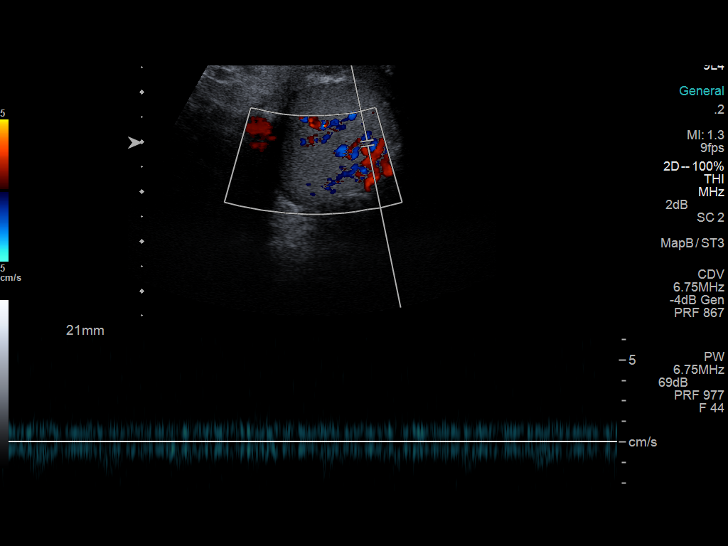
[im 40/54]
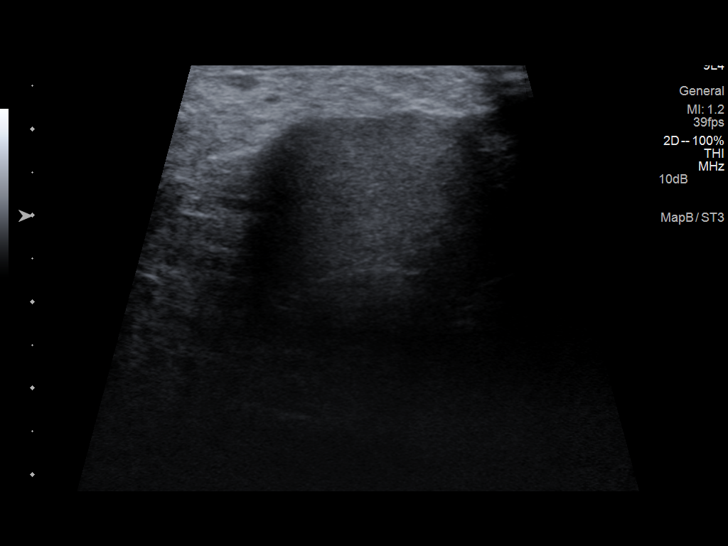
[im 45/54]
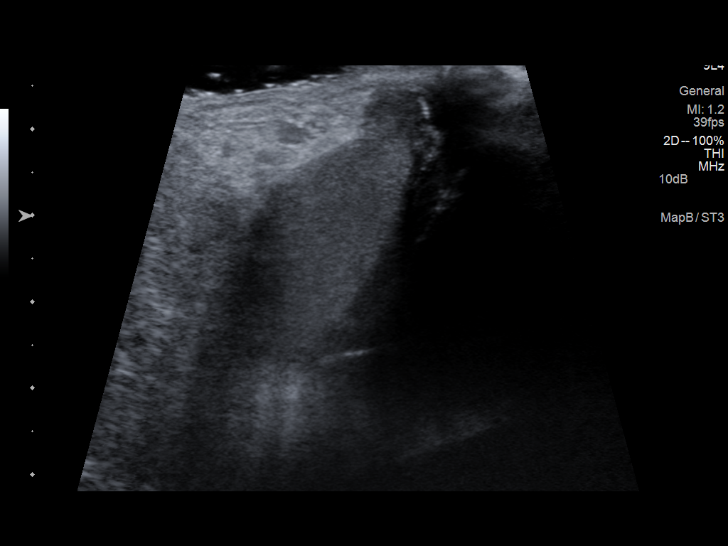
[im 49/54]
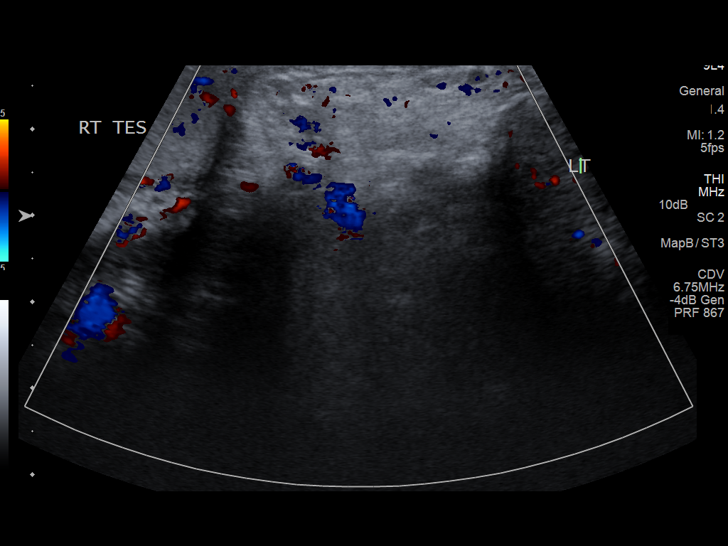
[im 54/54]
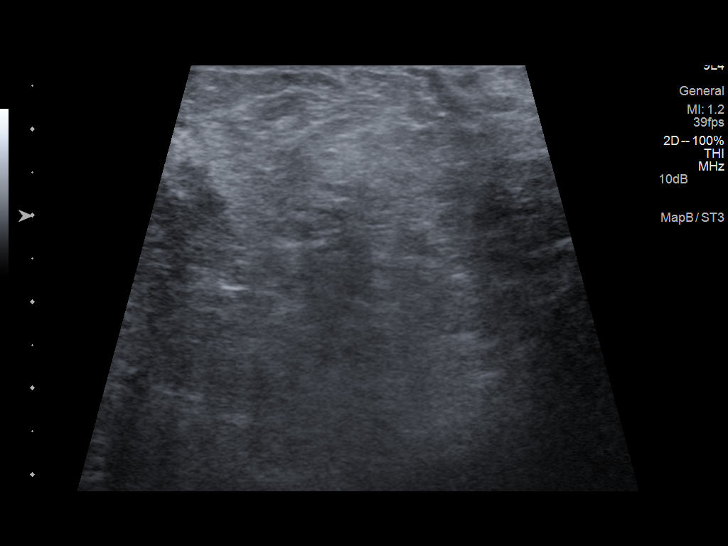

[13 of 25 positions shown; findings below may reference images not displayed]

FINDINGS: Right testicle

Measurements: Approximately 3.6 x 1.7 x 2.5 cm. No focal parenchymal
abnormality. Mild hyperemia on color Doppler evaluation.

Left testicle

Measurements: Approximately 3.1 x 1.9 x 3.3 cm.. No focal
parenchymal abnormality. Marked hyperemia on color Doppler
evaluation.

Right epididymis: Normal in size and appearance. No evidence of
hyperemia on color Doppler evaluation.

Left epididymis: Normal in size containing an approximate 1.1 x
cm cyst or spermatocele. No evidence of hyperemia on color Doppler
evaluation.

Hydrocele:  None visualized.

Varicocele:  None visualized.

Pulsed Doppler interrogation of both testes demonstrates normal low
resistance arterial and venous waveforms bilaterally.
IMPRESSION: 1. No evidence of testicular torsion.
2. Hyperemia involving both testicles, left greater than right,
consistent with orchitis.
3. No evidence of epididymitis.
4. Cyst or spermatocele involving the left epididymal head.

## 2015-12-09 IMAGING — CR DG CHEST 1V PORT
1 series · 1 of 1 positions shown · non-contrast
Comparison: 10/03/2012

CLINICAL DATA: Hypotension

EXAM:
PORTABLE CHEST - 1 VIEW

[AP]
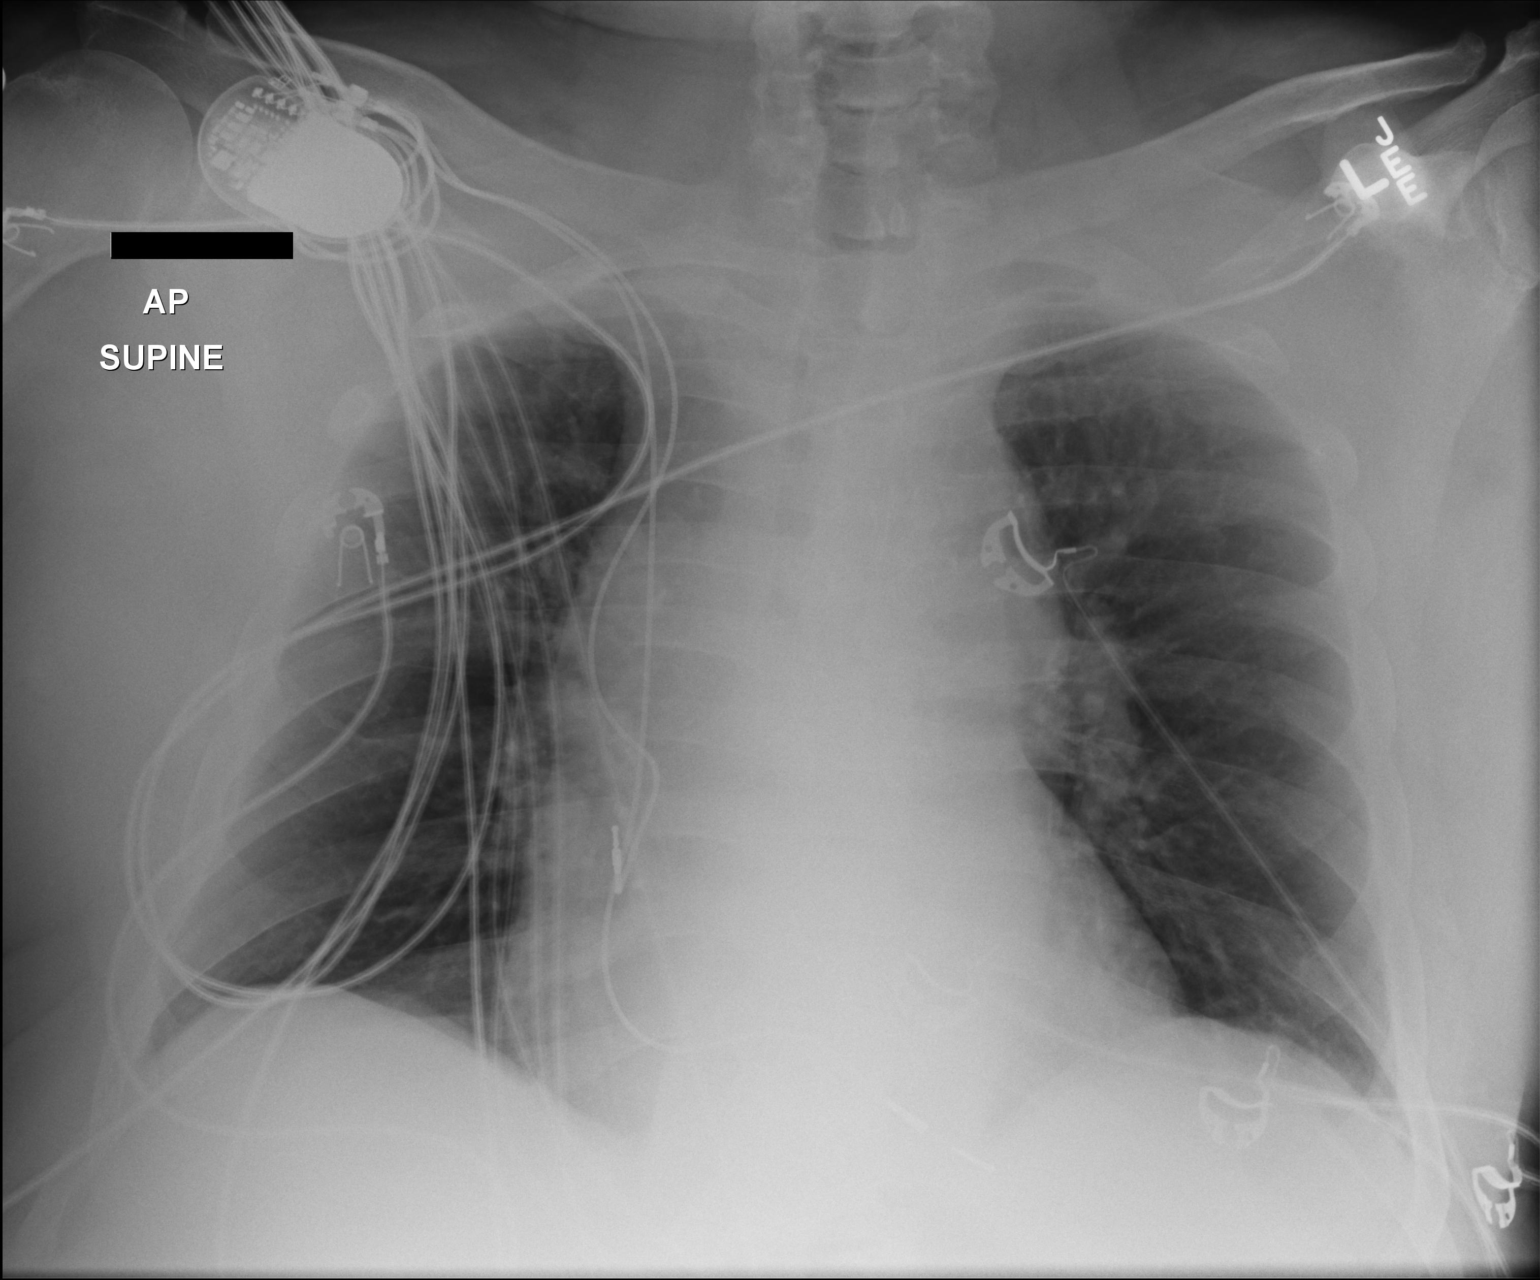

[1 of 1 positions shown; findings below may reference images not displayed]

FINDINGS: The heart remains moderately enlarged. Mediastinum remains
prominent. No pneumothorax or pleural effusion. Stable dual lead
right subclavian pacemaker device and leads.
IMPRESSION: Cardiomegaly without pulmonary edema.

## 2015-12-09 NOTE — Progress Notes (Signed)
Advanced Heart Failure Medication Review by a Pharmacist  Does the patient  feel that his/her medications are working for him/her?  yes  Has the patient been experiencing any side effects to the medications prescribed?  no  Does the patient measure his/her own blood pressure or blood glucose at home?  no   Does the patient have any problems obtaining medications due to transportation or finances?   no  Understanding of regimen: not assessed Understanding of indications: not assessed Potential of compliance: receives medications at nursing home Patient understands to avoid NSAIDs. Patient understands to avoid decongestants.  Issues to address at subsequent visits: none   Pharmacist comments: [name] is a 70 yo man who is cared for at [facility]. Due to his blindness, medications administered by nursing home. Medication list was requested, faxed over, and reviewed.     Time with patient: 0 min Preparation and documentation time: 15 min Total time: 15 min

## 2015-12-09 NOTE — Patient Instructions (Signed)
Your physician recommends that you schedule a follow-up appointment in: 6-8 weeks In the [place] following things EVERYDAY: 1) Weigh yourself in the morning before breakfast. Write it down and keep it in a log. 2) Take your medicines as prescribed 3) Eat low salt foods-Limit salt (sodium) to 2000 mg per day.  4) Stay as active as you can everyday 5) Limit all fluids for the day to less than 2 liters 6)

## 2015-12-09 NOTE — Progress Notes (Signed)
Patient ID: [name], male   DOB: 06/16/1946, 70 y.o.   MRN: [id number]   PCP: [name] Cardiology: Dr. Sallyanne Kuster HF Cardiology: Dr. Aundra Dubin  70 yo medically complicated man with chronic diastolic CHF complicated by RV dysfunction, morbid obesity, CKD stage III, symptomatic sinus bradycardia s/p PPM and blindness.   He returns for HF follow up. Overall feeling ok. Denies SOB/PND/Orthopnea. Does admit to mild dyspnea after he gets dressed. Wears CPAP nightly.  Ambulates with a rolling walker. Says he was able to walk without difficulty today.  [facility] in a [facility]. [facility].  Lives at home alone but has friends that check on him. He has an Aide 3 times a week.   Labs (11/16): K 3.8, creatinine 2.24 Labs (07/28/2015): K 3.5 Creatinine 3.41  Labs (08/19/2015): K 4.3 Creatinine 1.67  Labs (08/29/2015: K 4.1 Creatinine 2.01 Labs (1//18/2017): K 3.7 Creatinine 2.36 Labs (09/16/2015) : K 3.6 Creatinine 2.06   Labs (10/08/2015): K 3.7 Creatinine 1.95  Labs (10/27/2015): K 3.0 Creatinine 2.61  PMH: 1. Chronic diastolic CHF with RV dysfunction: Echo (2/15) with EF 60%, mild LVH, mild aortic stenosis with mean gradient 18 mmHg, RV mildly dilated with mildly decreased systolic function.  2. Aortic stenosis: Mild.  3. Type II diabetes 4. LBBB 5. OSA: Uses CPAP.  6. Morbid obesity.  7. HTN 8. CAD: Nonobstructive.  [place] 2005 with 60% LAD stenosis. 9. 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  10. Atrial fibrillation: Paroxysmal.  None recently.   11. CKD stage III 12. Macular degeneration: Legally blind.  13. 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  14. 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 15. S/p left foot amputation 16. Diabetic peripheral neuropathy.  17. ECHO EF 55-60%. Mild-mod AS  Social History   Social History  . Marital Status: Single    Spouse Name: N/A  . Number of Children: Y    . Years of Education: N/A   Occupational History  . disabled    Social History Main Topics  . Smoking status: Former Smoker -- 0.00 packs/day for 0 years    Types: Cigarettes    Quit date: 08/17/2003  . Smokeless tobacco: Never Used     Comment: started at age 73.  only smoked on occ- very rarely.0  . Alcohol Use: Yes     Comment: 06/23/2013 "drank some; never had a problem w/it"  . Drug Use: No  . Sexual Activity: No   Other Topics Concern  . Lives with roommate.   Family History  Problem Relation Age of Onset  . Coronary artery disease Sister   . Heart disease Sister   . Allergies Sister   . Asthma Brother   . Rheum arthritis Brother    ROS: All systems reviewed and negative except as per HPI.  Current Outpatient Prescriptions  Medication Sig Dispense Refill  . acetaminophen (TYLENOL) 650 MG CR tablet Take 650 mg by mouth 2 (two) times daily.    Marland Kitchen albuterol (PROVENTIL HFA;VENTOLIN HFA) 108 (90 Base) MCG/ACT inhaler Inhale 2 puffs into the lungs every 6 (six) hours as needed for wheezing or shortness of breath.    . allopurinol (ZYLOPRIM) 100 MG tablet Take 100 mg by mouth daily. Reported on 12/09/2015    . amiodarone (PACERONE) 200 MG tablet Take 200 mg by mouth daily. Reported on 12/09/2015    . amLODipine (NORVASC) 10 MG tablet Take 10 mg by  mouth daily. Reported on 12/09/2015    . aspirin 325 MG tablet Take 325 mg by mouth daily.    Marland Kitchen atorvastatin (LIPITOR) 40 MG tablet Take 1 tablet (40 mg total) by mouth daily. 90 tablet 3  . Cholecalciferol (VITAMIN D-3) 1000 UNITS CAPS Take 1 capsule by mouth daily.    Marland Kitchen gabapentin (NEURONTIN) 300 MG capsule Take 300 mg by mouth 2 (two) times daily.    . hydrALAZINE (APRESOLINE) 25 MG tablet Take 25 mg by mouth 3 (three) times daily.    . insulin detemir (LEVEMIR) 100 UNIT/ML injection Inject 110 Units into the skin 2 (two) times daily.     Marland Kitchen lidocaine (LIDODERM) 5 % Place 1 patch onto the skin daily as needed (back pain). Remove &  Discard patch within 12 hours or as directed by MD    . Liraglutide (VICTOZA) 18 MG/3ML SOPN Inject 1.8 mg into the skin daily.    . Melatonin 3 MG CAPS Take 1 capsule by mouth at bedtime as needed (sleep).     . Menthol, Topical Analgesic, (BIOFREEZE EX) Apply 1 application topically daily as needed (pain). Reported on 10/27/2015    . methylcellulose (ARTIFICIAL TEARS) 1 % ophthalmic solution Place 1 drop into both eyes 2 (two) times daily.    . metolazone (ZAROXOLYN) 2.5 MG tablet Take 2.5 mg by mouth as directed. 1 tab twice weekly on Monday and Thursdays    . metoprolol succinate (TOPROL-XL) 100 MG 24 hr tablet Take 100 mg by mouth daily. Take with or immediately following a meal.    . pantoprazole (PROTONIX) 40 MG tablet Take 40 mg by mouth daily.    . potassium chloride SA (K-DUR,KLOR-CON) 20 MEQ tablet Take 20 mEq by mouth as directed. Take 2 tab (53mEq) every morning and 1 tab (20 mEq) every evening    . sennosides-docusate sodium (SENOKOT-S) 8.6-50 MG tablet Take 1 tablet by mouth daily as needed for constipation.    . Skin Protectants, Misc. (EUCERIN) cream Apply 1 application topically 2 (two) times daily.    Marland Kitchen torsemide (DEMADEX) 100 MG tablet Take 1 tablet (100 mg total) by mouth 2 (two) times daily. 60 tablet 6  . potassium chloride SA (K-DUR,KLOR-CON) 20 MEQ tablet Take 40 mEq by mouth daily.     No current facility-administered medications for this encounter.   BP 126/65 mmHg  Pulse 88  Resp 18  Wt 348 lb (157.852 kg)  SpO2 94% General: NAD, morbidly obese. Walked in the clinic with a rolling walker.  No difficulty.  Neck: Very thick, JVP difficult, no thyromegaly or thyroid nodule.  Lungs:Clear .   CV: Nondisplaced PMI.  Heart regular S1/S2, no S3/S4, 2/6 early SEM RUSB with clear S2.  No carotid bruit. Abdomen: Obese, Soft, nontender, no hepatosplenomegaly, mild distention.  Skin: Intact without lesions or rashes.  Neurologic: Alert and oriented x 3.  Psych: Normal  affect. Extremities: No clubbing or cyanosis. Trace lower extremity edema.     HEENT: Normal.   Assessment/Plan: 1. Chronic diastolic CHF with prominent RV failure: NYHA class III symptoms.  Functionally seems to be stable. Volume status stable.  Continue torsemide 100 mg bid.  Continue metolazone to three times weekly for now.   2. Aortic stenosis: Mild-Mod on todays ECHO.    3. Atrial fibrillation: Paroxysmal.  Last episode was in 10/14 based on device interrogation.  He is not anticoagulated (takes ASA) due to prior intracerebral hemorrhage. Regular pulse today.  4. CAD: Nonobstructive.  No  chest pain.  He is on ASA and statin.  5. Bradycardia s/p Medtronic PPM  6. CKD: Stage III.  BMET today and will refer to nephrology.     7. OSA: Continue CPAP nightly.  Follow 6-8   weeks with NP.   Kamree Wiens NP-C  12/09/2015

## 2015-12-10 IMAGING — US US RENAL
1 series · 14 of 25 positions shown · non-contrast
Comparison: Renal ultrasound 03/20/2013

CLINICAL DATA: Rule out hydronephrosis

EXAM:
RENAL/URINARY TRACT ULTRASOUND COMPLETE

[Series 1: us renal · 0.29mm/px · 14 of 36 slices shown]
[im 1/36]
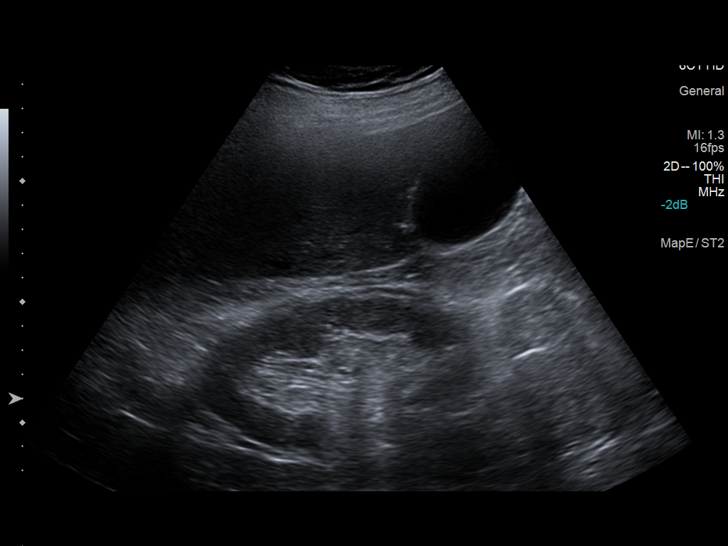
[im 3/36]
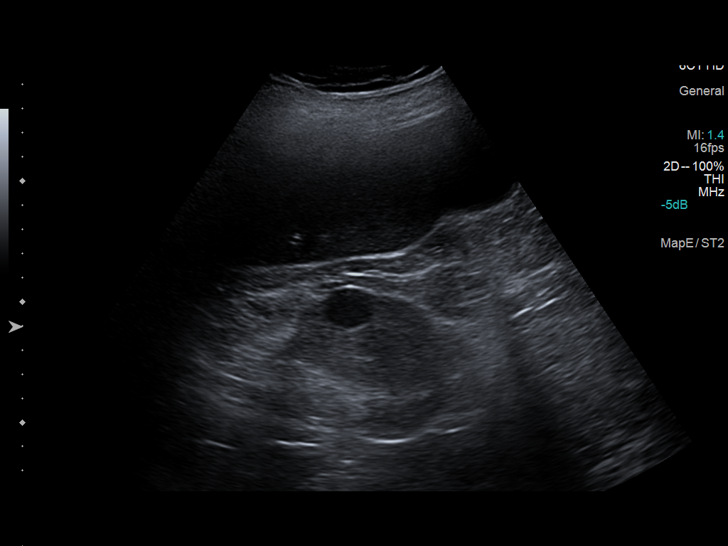
[im 6/36]
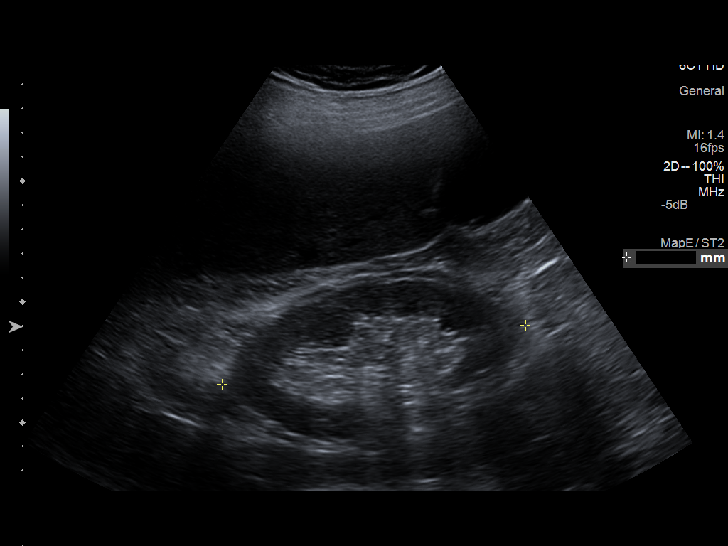
[im 9/36]
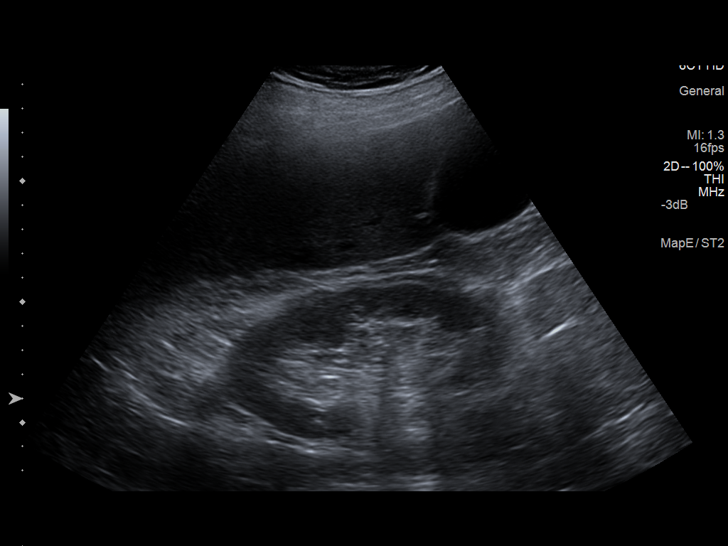
[im 12/36]
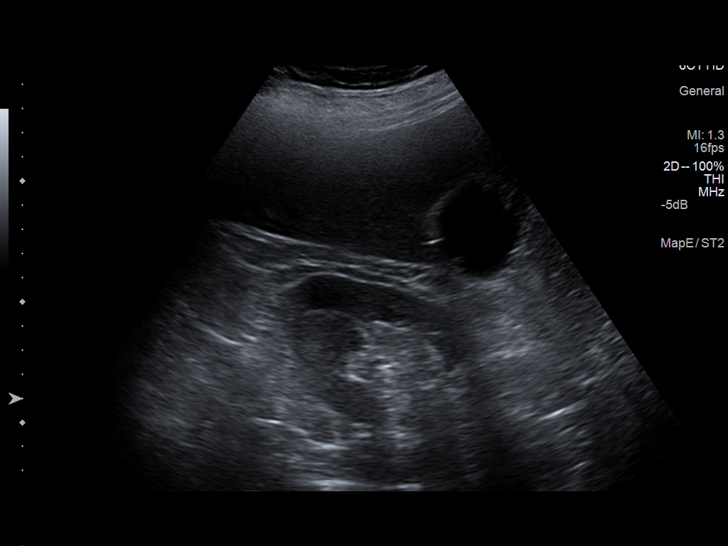
[im 14/36]
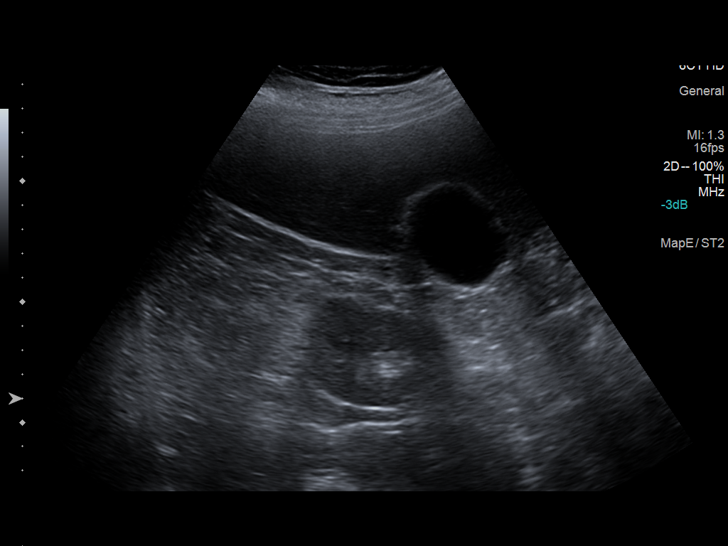
[im 17/36]
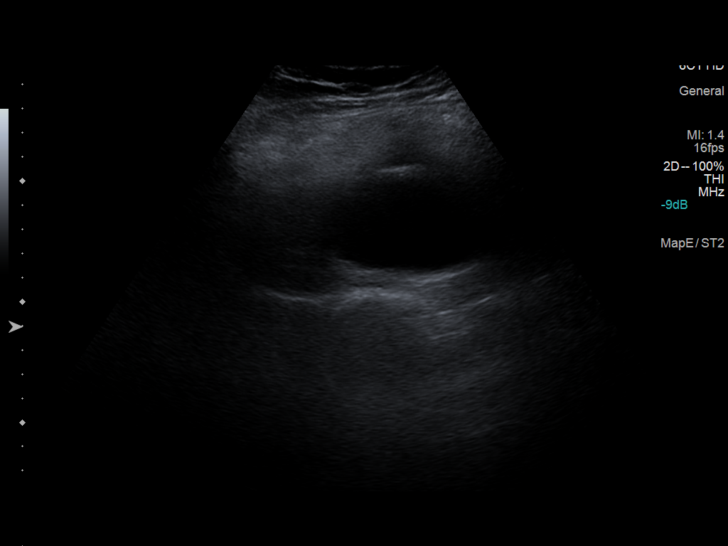
[im 19/36]
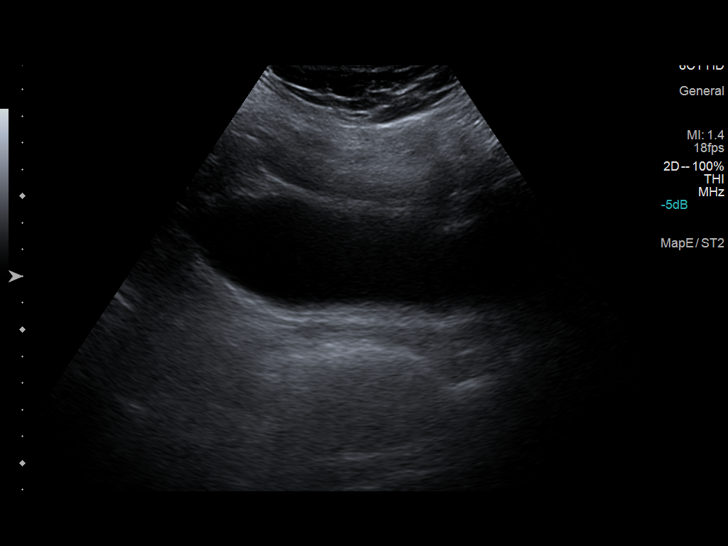
[im 22/36]
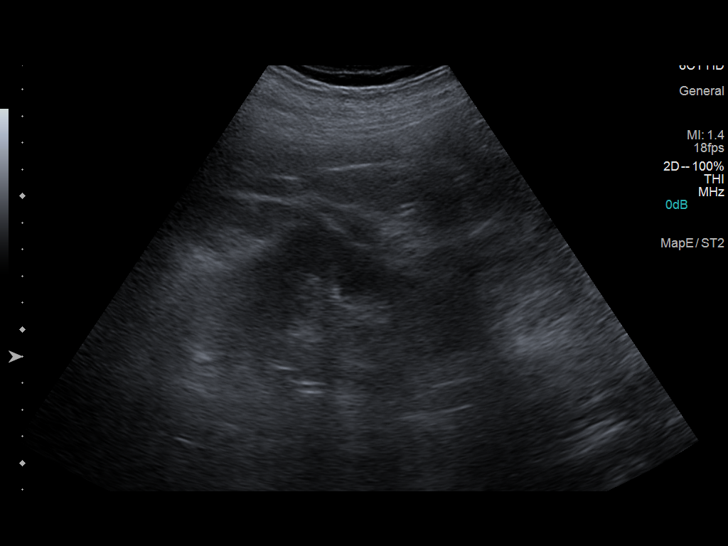
[im 24/36]
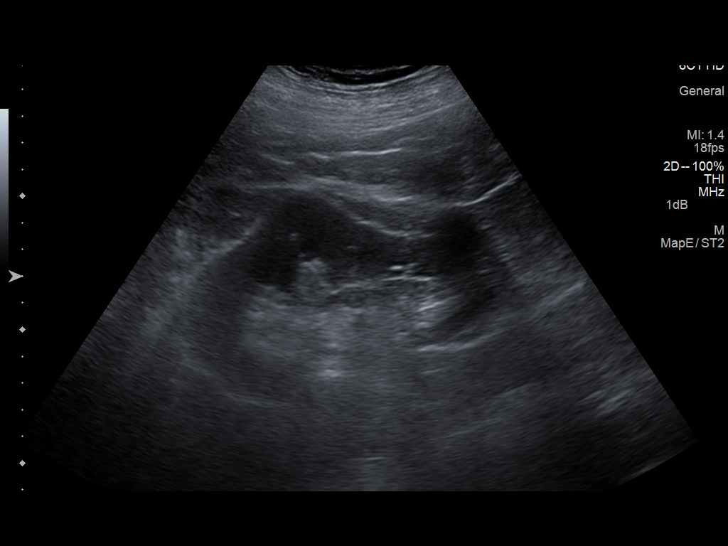
[im 27/36]
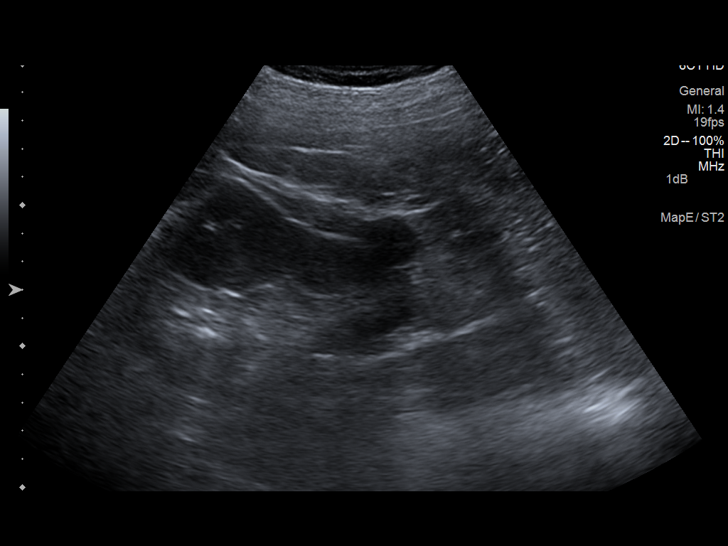
[im 30/36]
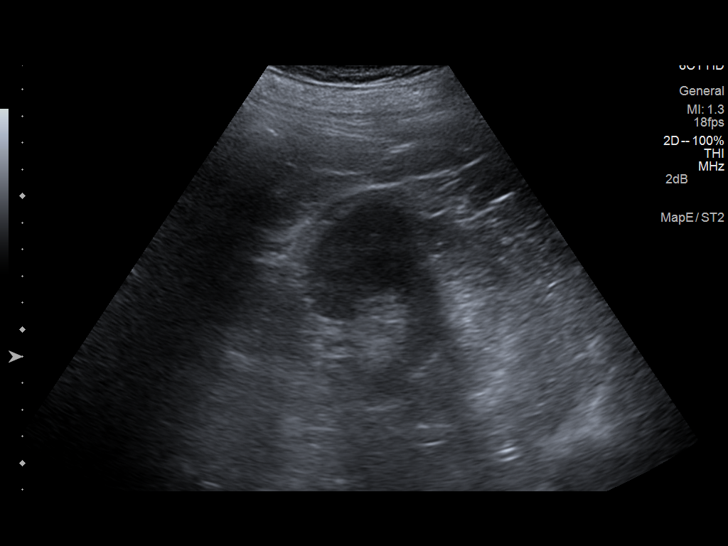
[im 33/36]
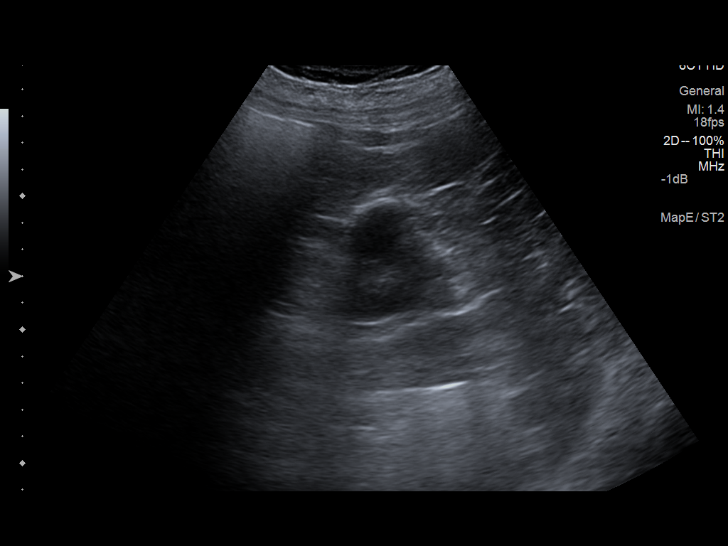
[im 36/36]
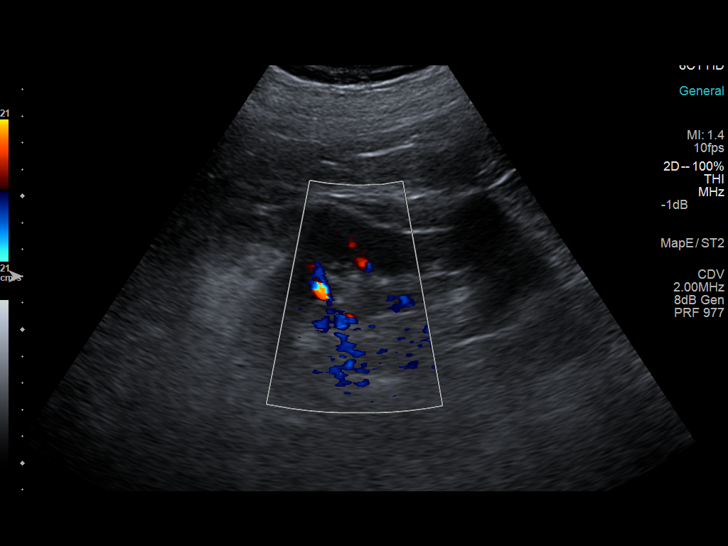

[14 of 25 positions shown; findings below may reference images not displayed]

FINDINGS: Right Kidney:

Length: 12.8 cm. 2.2 cm midpole cyst. Echogenicity within normal
limits. No mass or hydronephrosis visualized.

Left Kidney:

Length: 12.8 cm. 2.1 cm lower pole cyst.. Echogenicity within normal
limits. No mass or hydronephrosis visualized.

Bladder:

Appears normal for degree of bladder distention.

Gallstones are noted.
IMPRESSION: Normal renal size.  No obstruction.

Cholelithiasis.

## 2015-12-11 IMAGING — CR DG CHEST 2V
2 series · 2 of 2 positions shown · non-contrast
Comparison: 08/27/2013 .

CLINICAL DATA: Shortness of breath.  Cough.

EXAM:
CHEST  2 VIEW

[w chest lat]
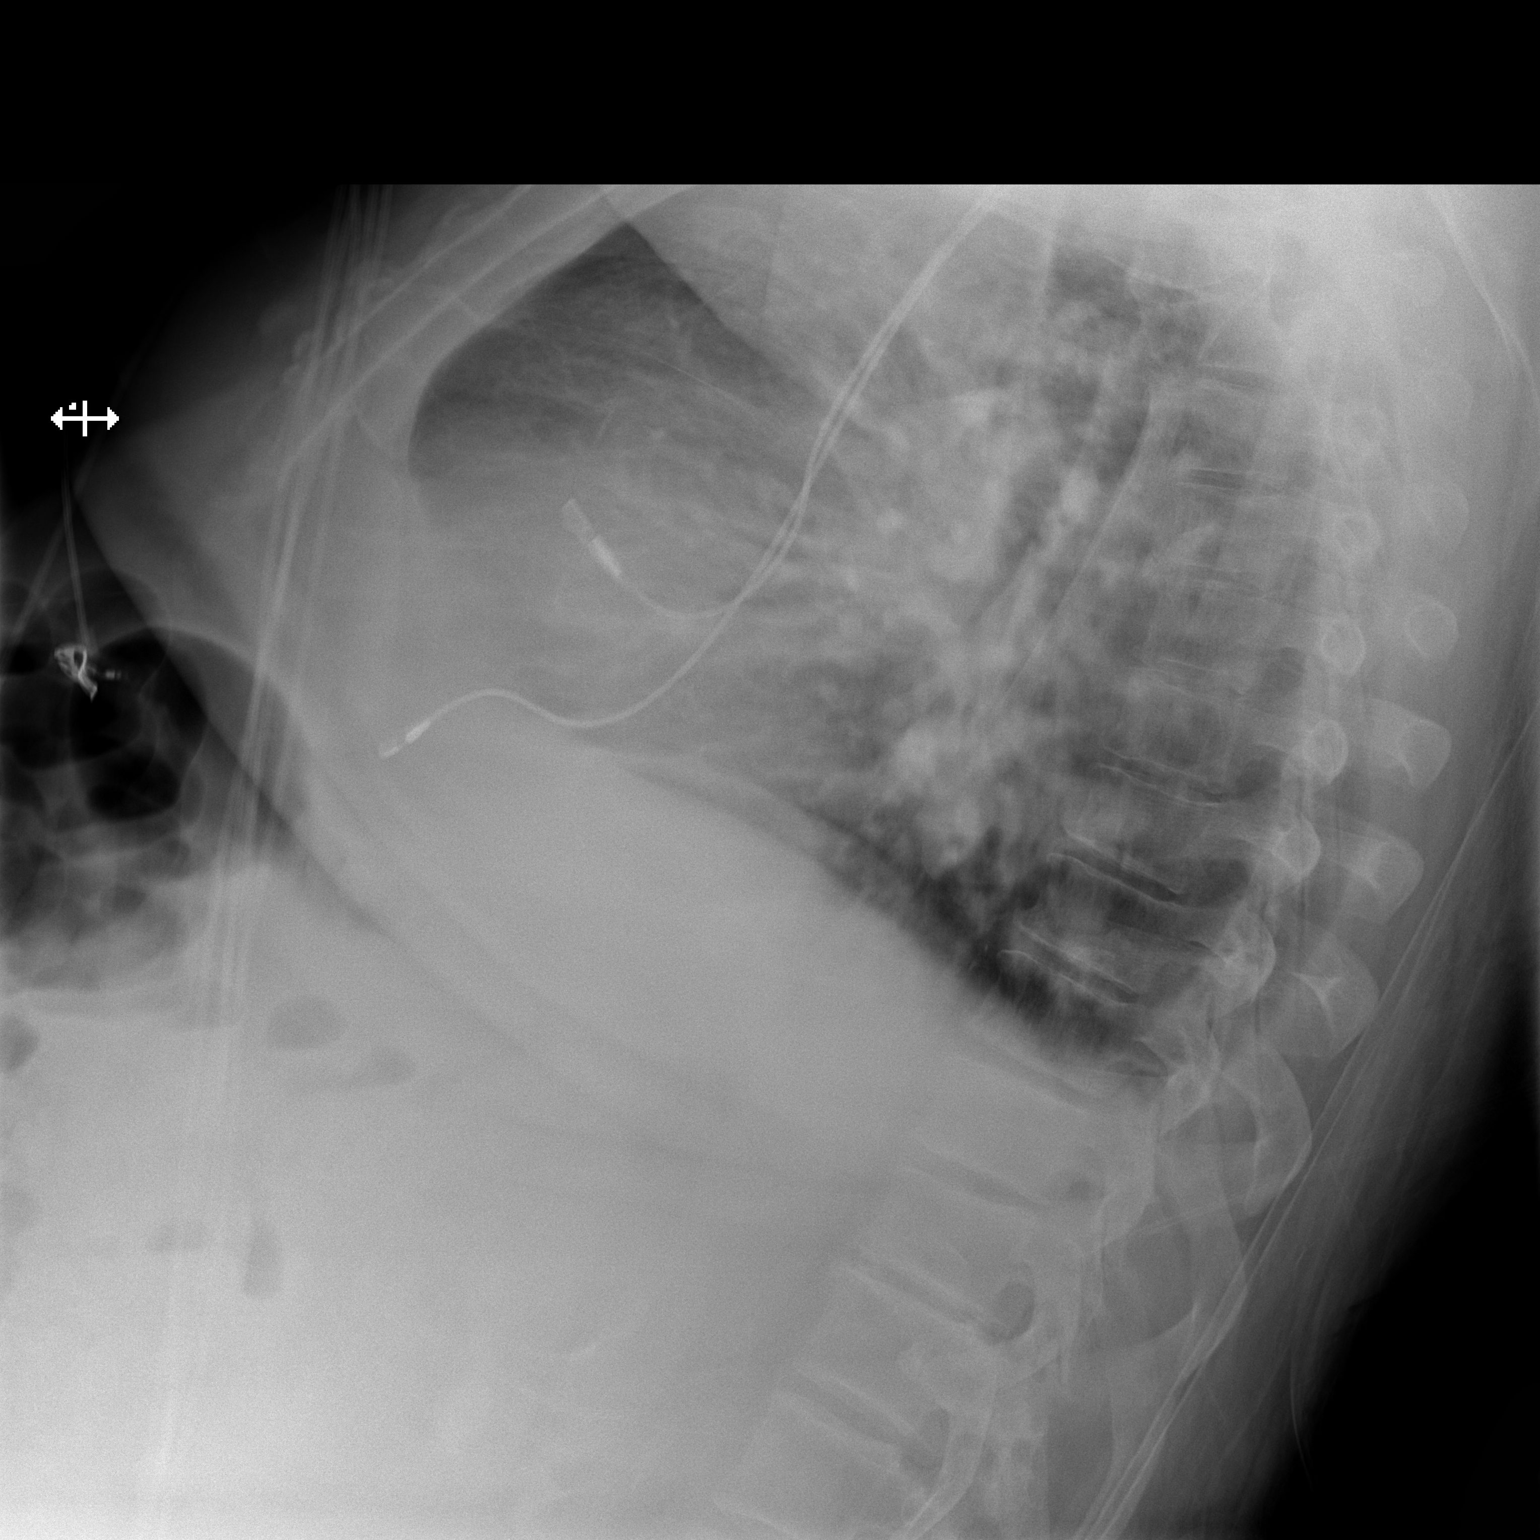

[x chest ap]
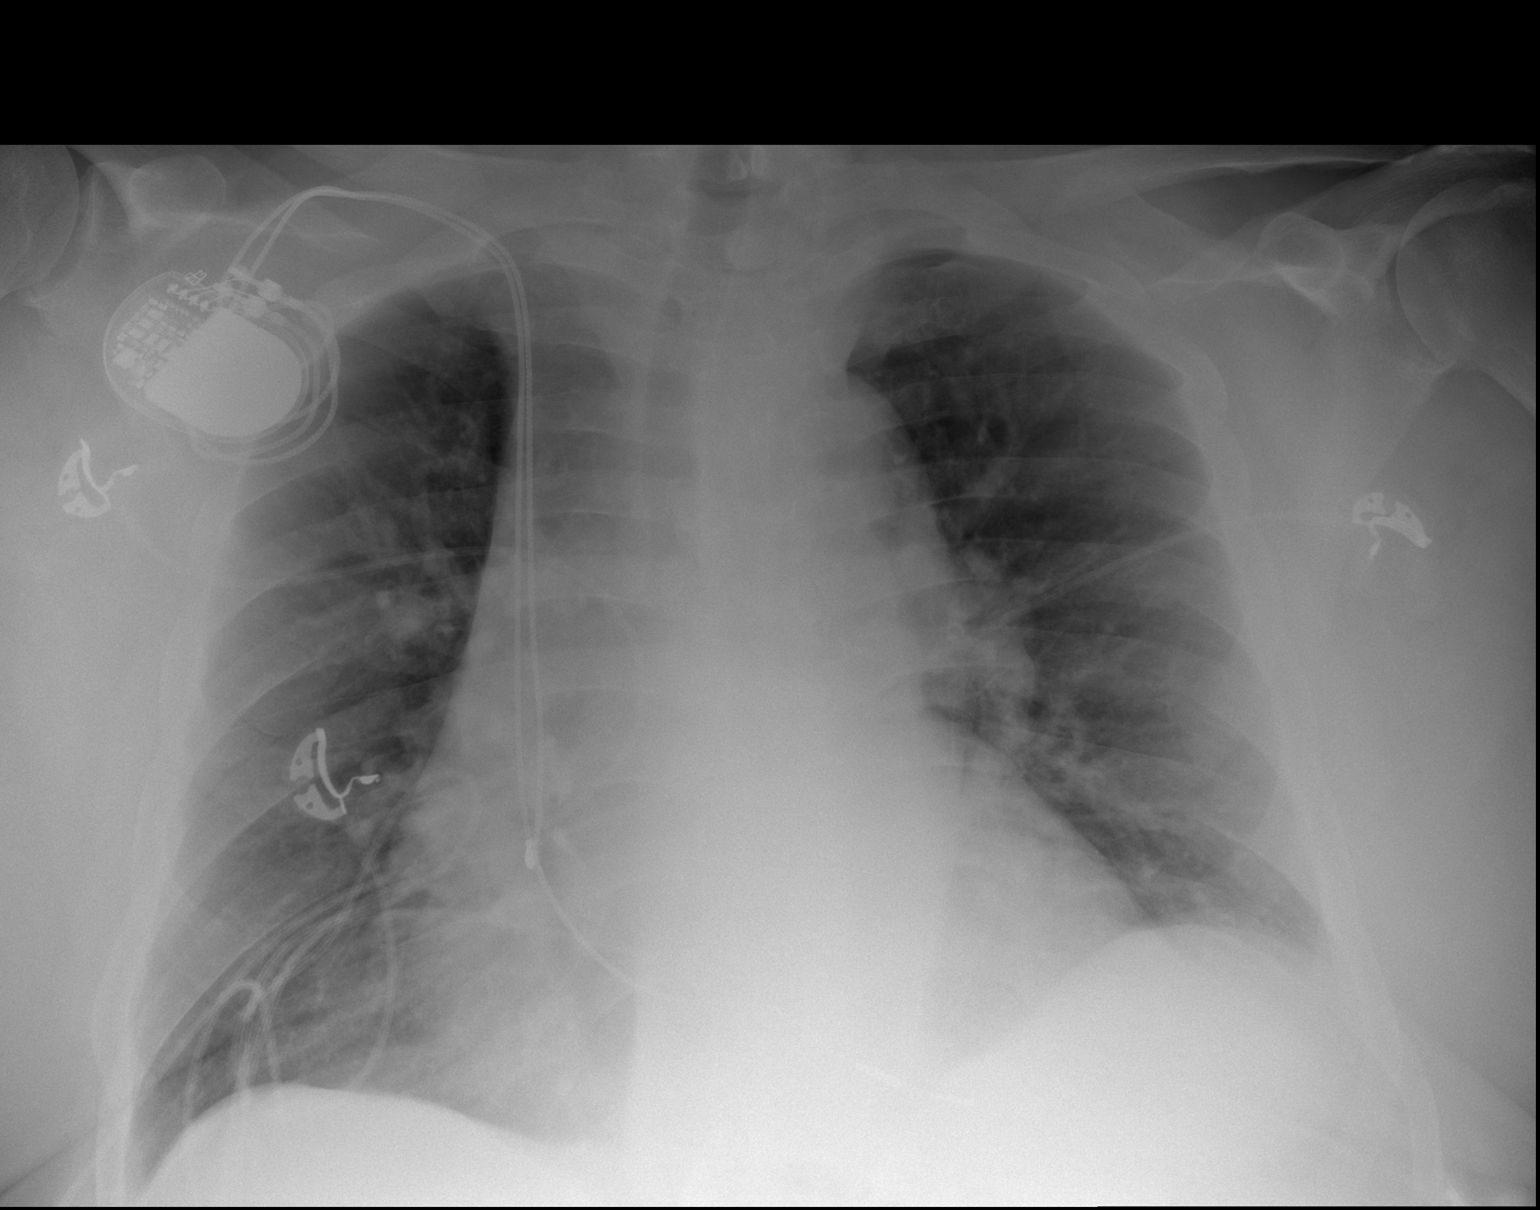

[2 of 2 positions shown; findings below may reference images not displayed]

FINDINGS: Cardiomegaly, normal pulmonary vascularity. Cardiac pacer with lead
tips in the right atrium and right ventricle. No focal infiltrate.
No pleural effusion or pneumothorax. Diffuse degenerative changes
thoracic spine.
IMPRESSION: Cardiomegaly. No CHF. Cardiac pacer in good anatomic position . No
acute cardiopulmonary disease.

## 2015-12-18 IMAGING — US US SCROTUM
1 series · 14 of 25 positions shown · non-contrast
Comparison: None.

CLINICAL DATA: Right scrotal tenderness. Evaluate for torsion or
epididymitis.

EXAM:
SCROTAL ULTRASOUND
DOPPLER ULTRASOUND OF THE TESTICLES
TECHNIQUE: Complete ultrasound examination of the testicles, epididymis, and
other scrotal structures was performed. Color and spectral Doppler
ultrasound were also utilized to evaluate blood flow to the
testicles.

[Series 1: us scrotum · 0.07mm/px · 14 of 27 slices shown]
[im 1/27]
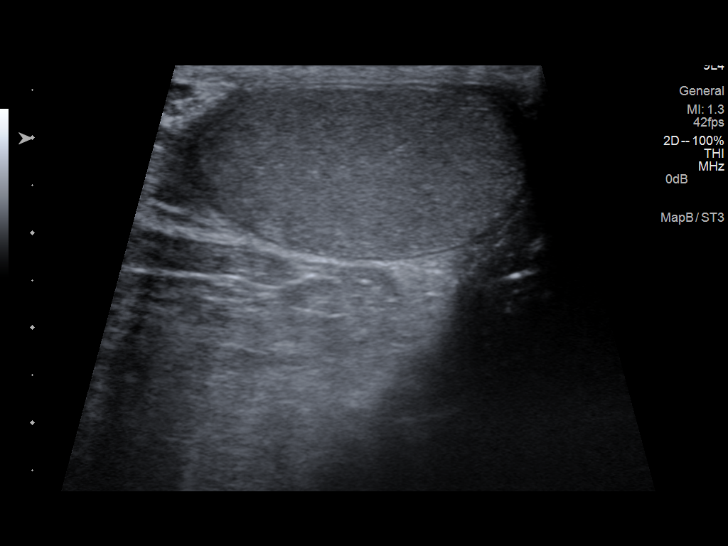
[im 3/27]
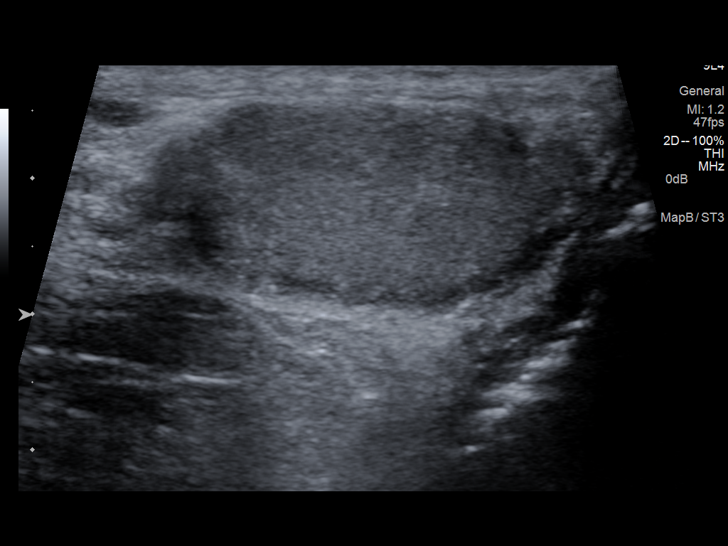
[im 5/27]
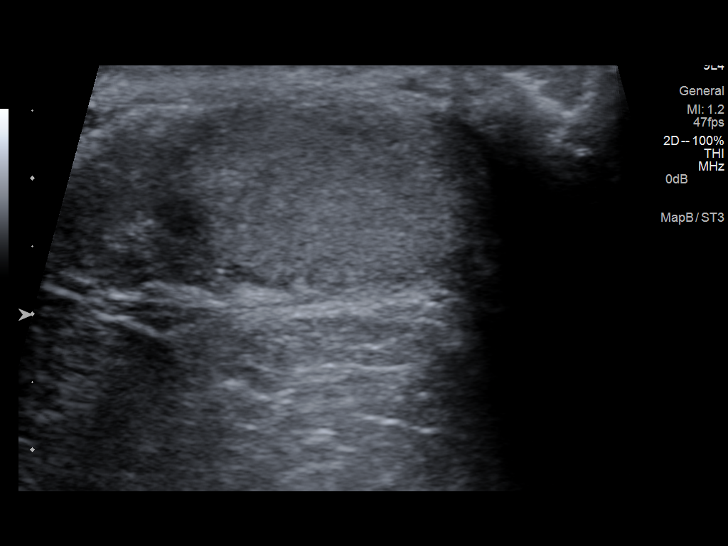
[im 7/27]
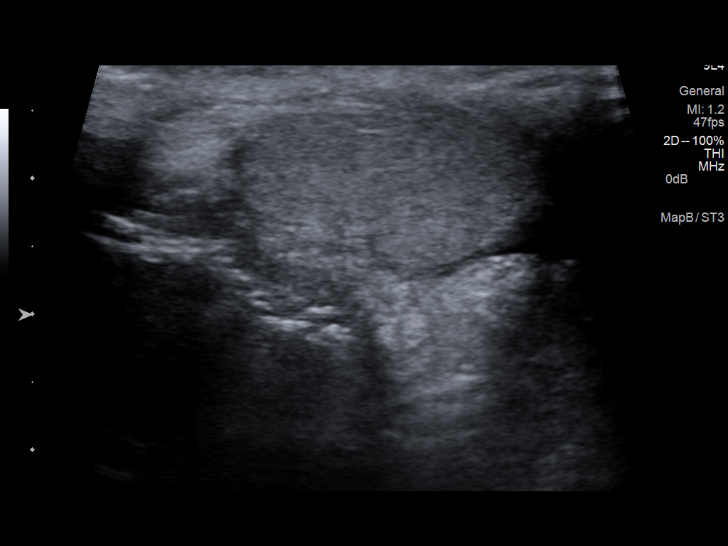
[im 9/27]
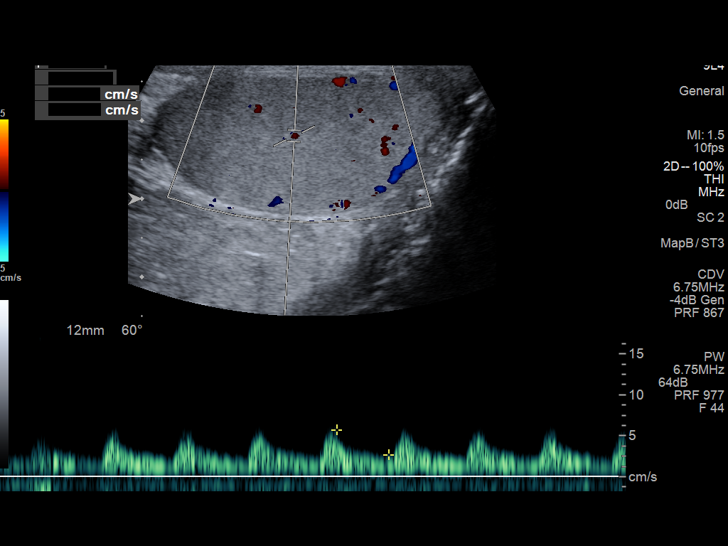
[im 10/27]
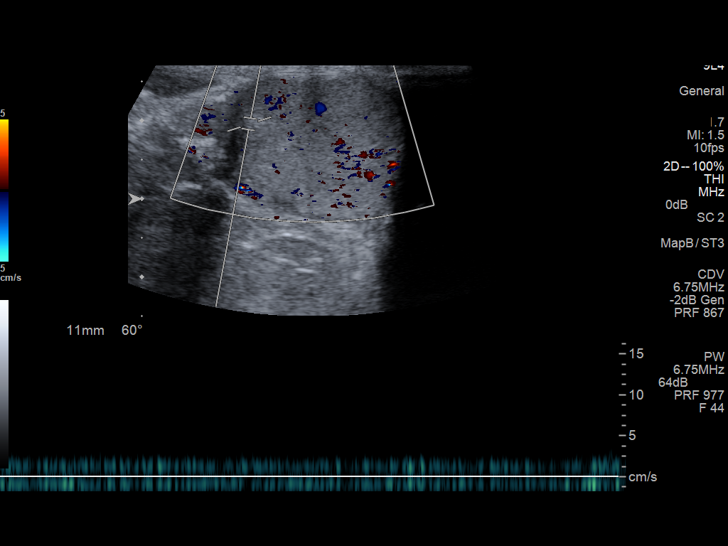
[im 12/27]
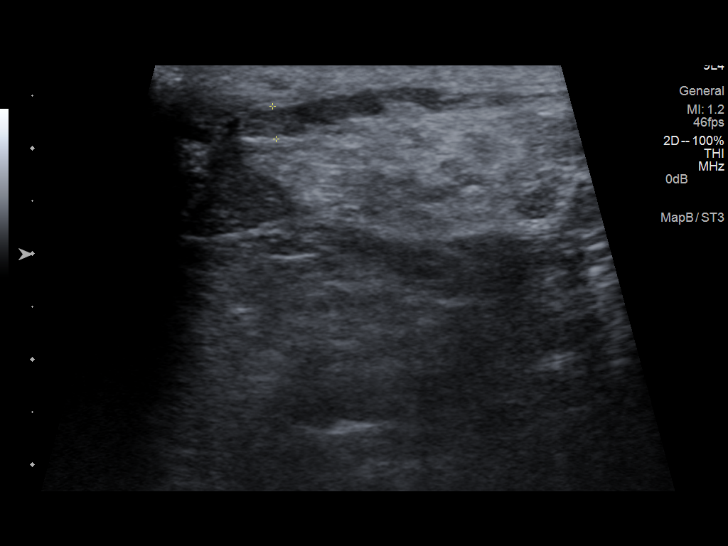
[im 15/27]
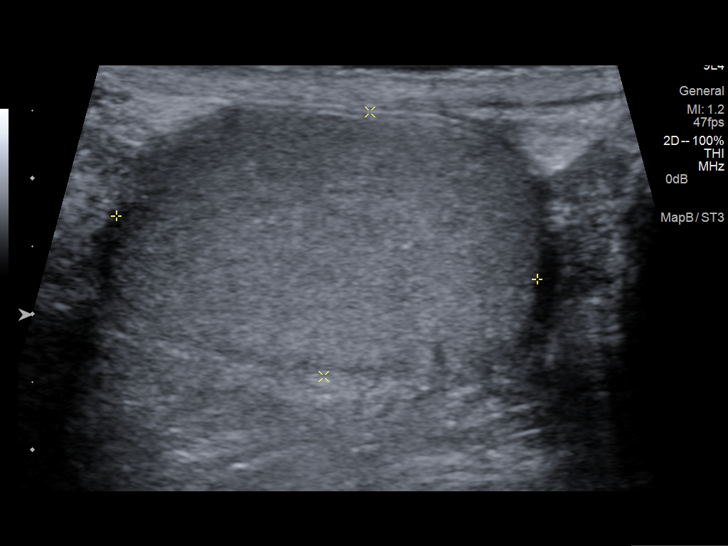
[im 17/27]
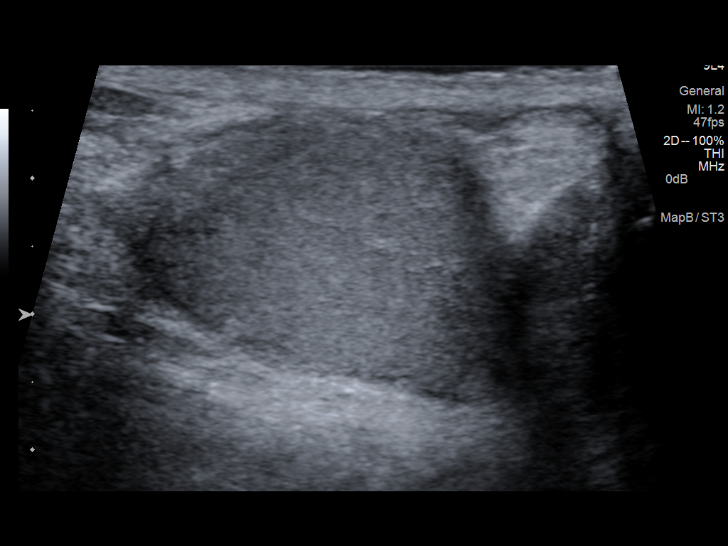
[im 18/27]
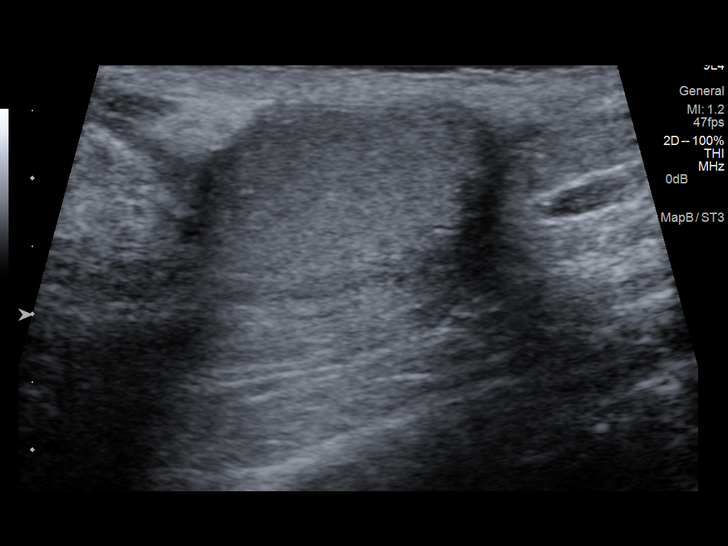
[im 20/27]
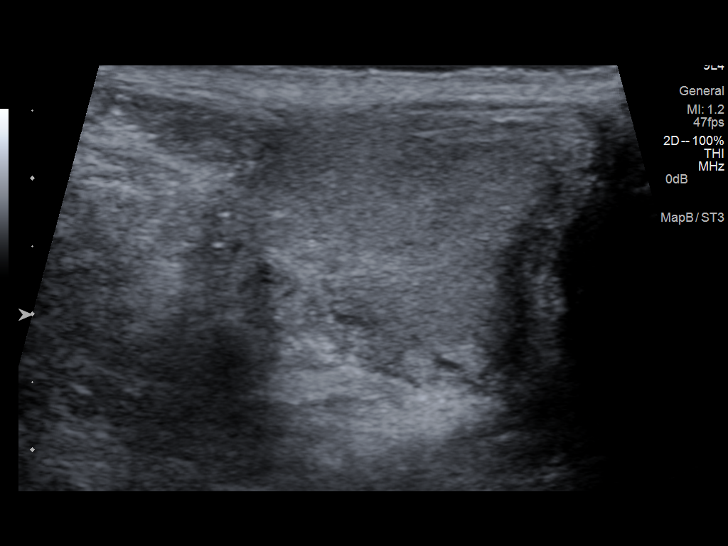
[im 22/27]
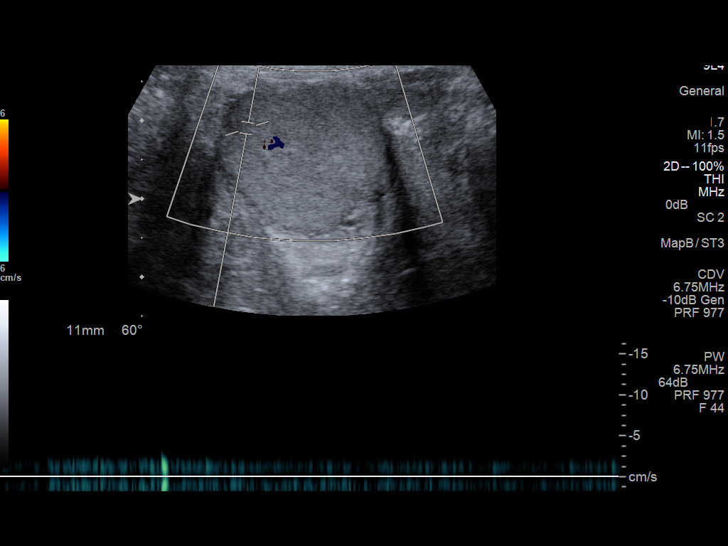
[im 24/27]
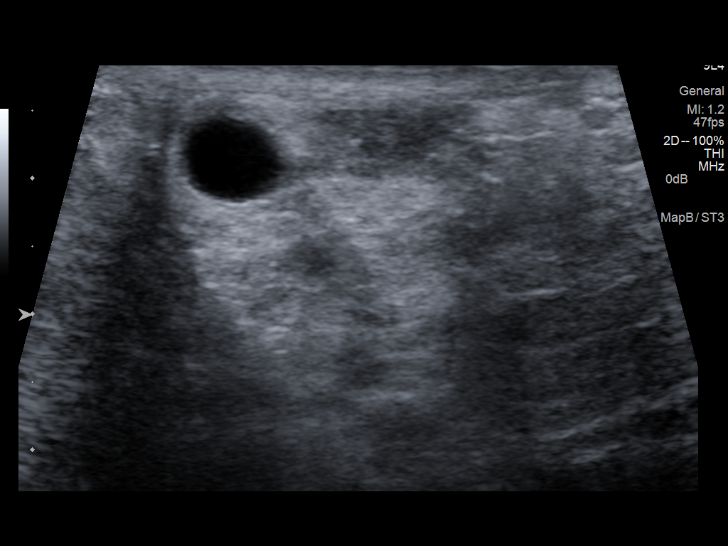
[im 27/27]
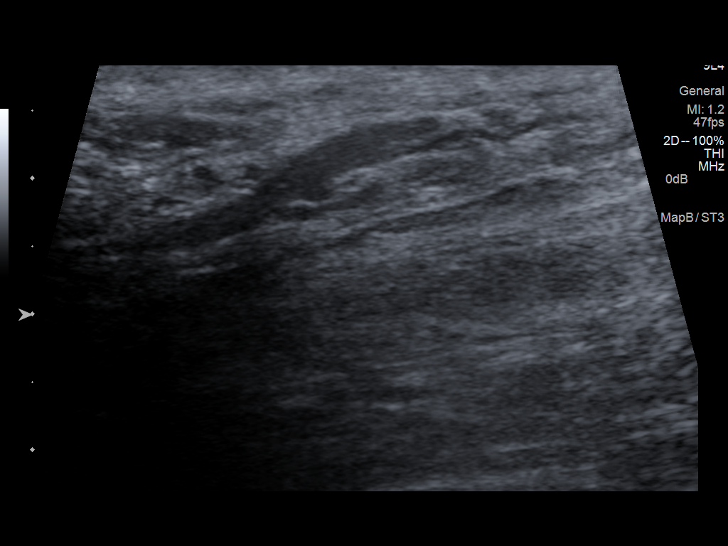

[14 of 25 positions shown; findings below may reference images not displayed]

FINDINGS: Right testicle

Measurements: 3.6 x 1.8 x 2.5 cm. No mass or microlithiasis
visualized.

Left testicle

Measurements: 3.1 x 2.0 x 2.5 cm. No mass or microlithiasis
visualized.

Right epididymis:  Normal in size and appearance.

Left epididymis:  A 0.8 cm cyst is noted at the epididymal head.

Hydrocele:  None visualized.

Varicocele:  None visualized.

Pulsed Doppler interrogation of both testes demonstrates low
resistance arterial and venous waveforms bilaterally.
IMPRESSION: 1. No evidence of testicular torsion; no evidence of epididymitis.
2. Small left epididymal head cyst noted.

## 2015-12-18 IMAGING — CR DG KNEE COMPLETE 4+V*R*
5 series · 5 of 5 positions shown · non-contrast
Comparison: DG KNEE 2 VIEWS*R* dated 01/31/2008

CLINICAL DATA: Right knee pain.

EXAM:
RIGHT KNEE - COMPLETE 4+ VIEW

[x knee lat right (1 of 2)]
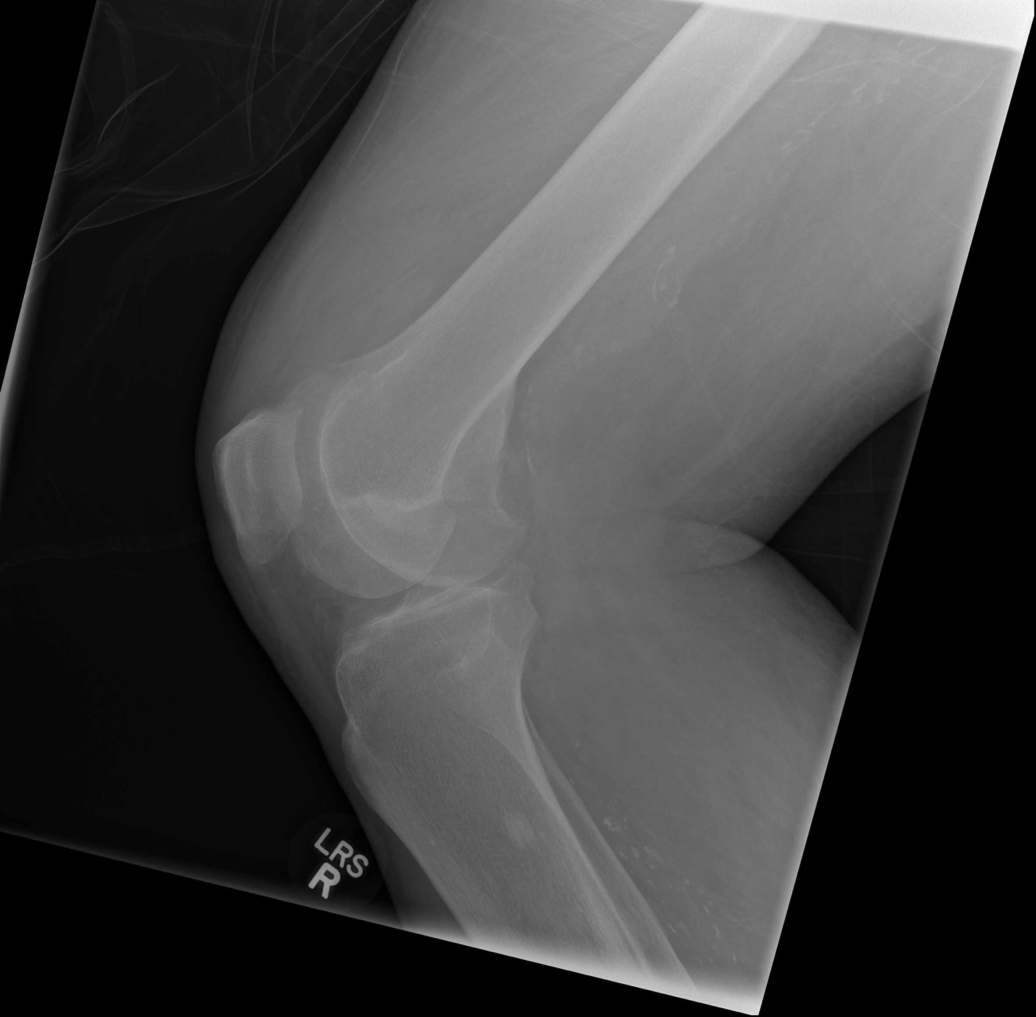

[x knee ap right]
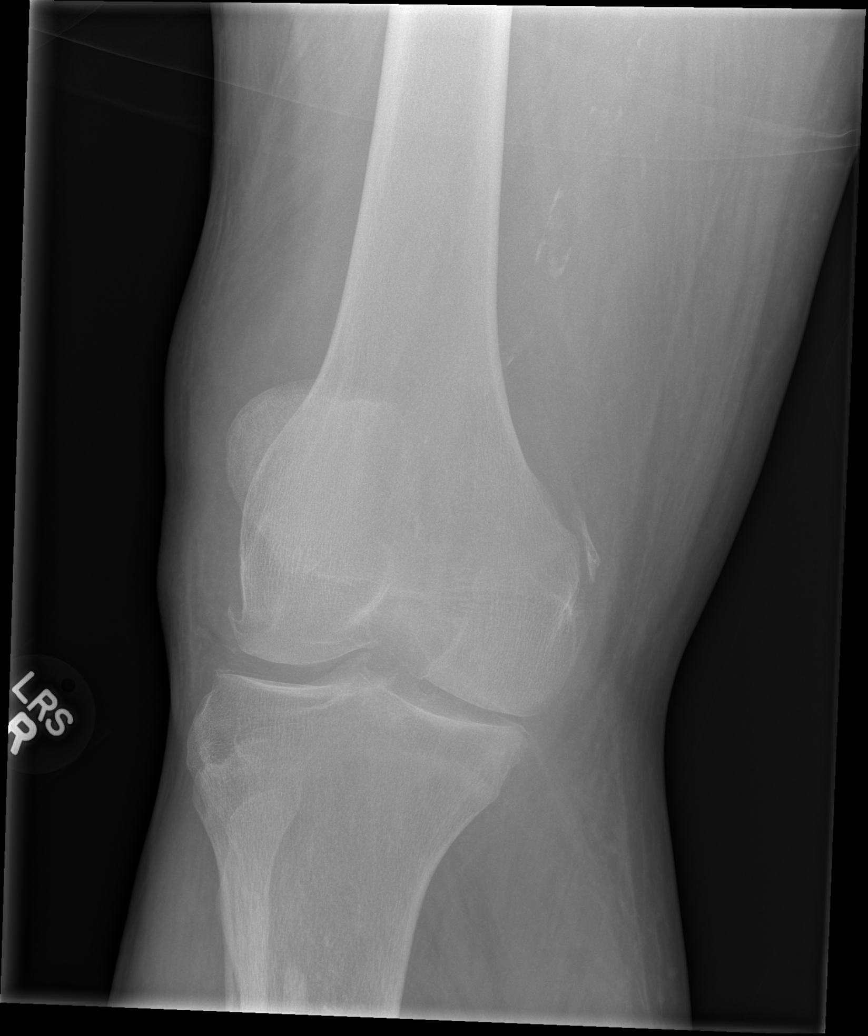

[x knee obl right (1 of 2)]
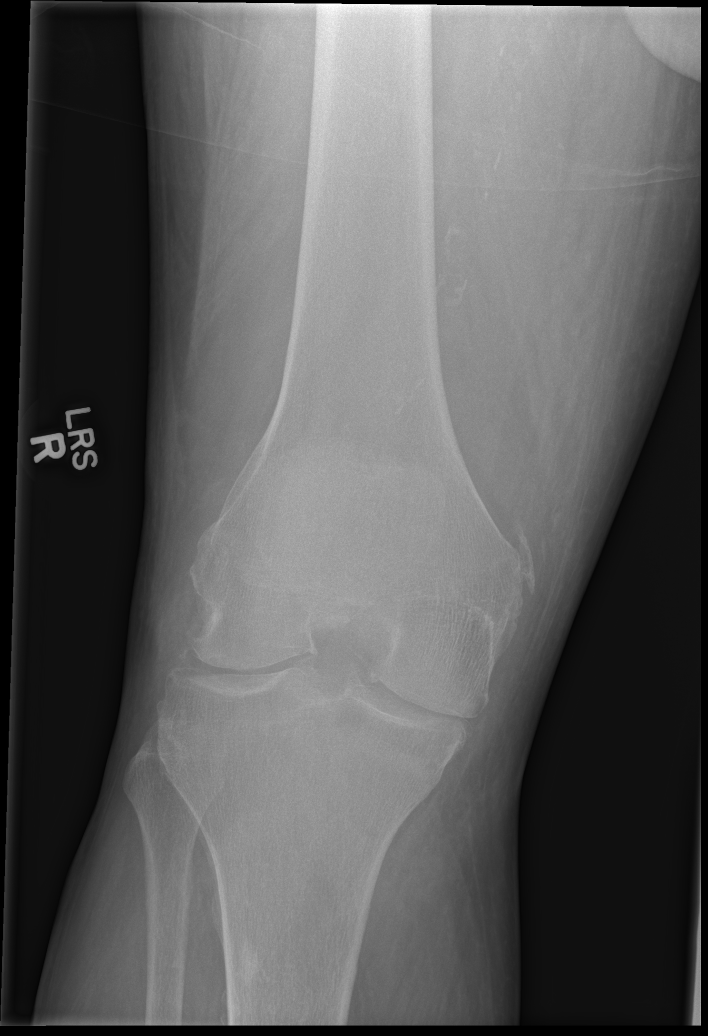

[x knee obl right (2 of 2)]
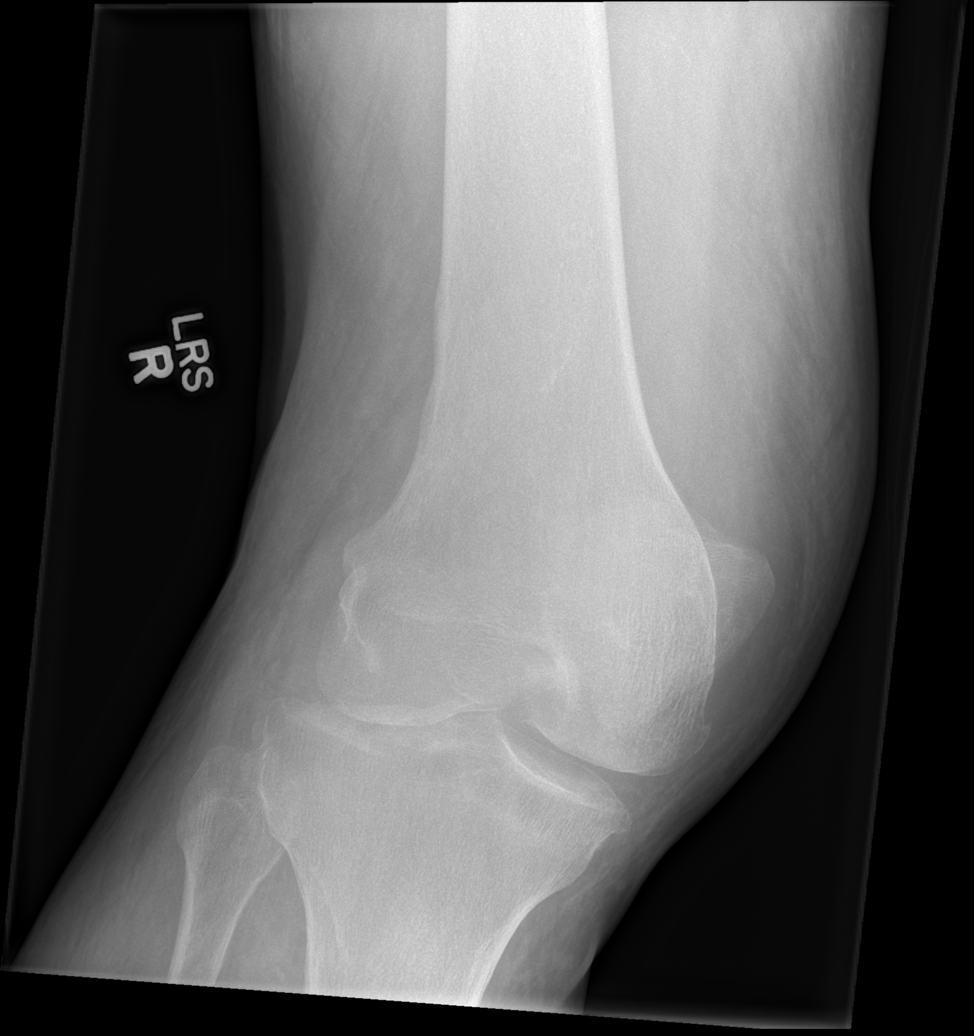

[x knee lat right (2 of 2)]
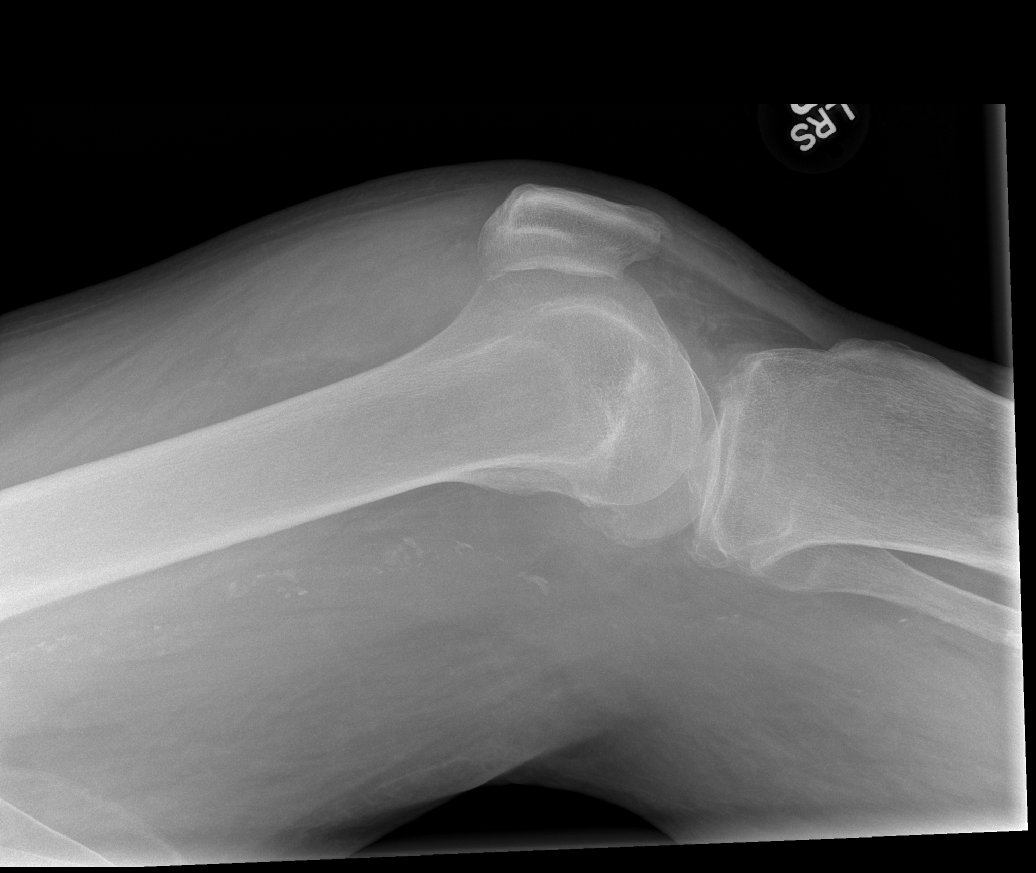

[5 of 5 positions shown; findings below may reference images not displayed]

FINDINGS: There is no evidence of fracture or dislocation. The joint spaces
are preserved. Marginal osteophytes are seen at the lateral
compartment; the patellofemoral joint is grossly unremarkable in
appearance. Calcification along the medial aspect of the medial
femoral condyle is somewhat higher than expected for a
Pellegrini-Stieda lesion, and may reflect calcification within the
distal musculature.

A small knee joint effusion is suspected. Scattered vascular
calcifications are seen.
IMPRESSION: 1. No evidence of fracture or dislocation.
2. Suspect small knee joint effusion.
3. Calcification along the medial aspect of the medial femoral
condyle may reflect prior injury to overlying musculature.

## 2015-12-18 IMAGING — CR DG CHEST 1V PORT
1 series · 1 of 1 positions shown · non-contrast
Comparison: Chest radiograph from 08/29/2013

CLINICAL DATA: Sepsis.

EXAM:
PORTABLE CHEST - 1 VIEW

[AP]
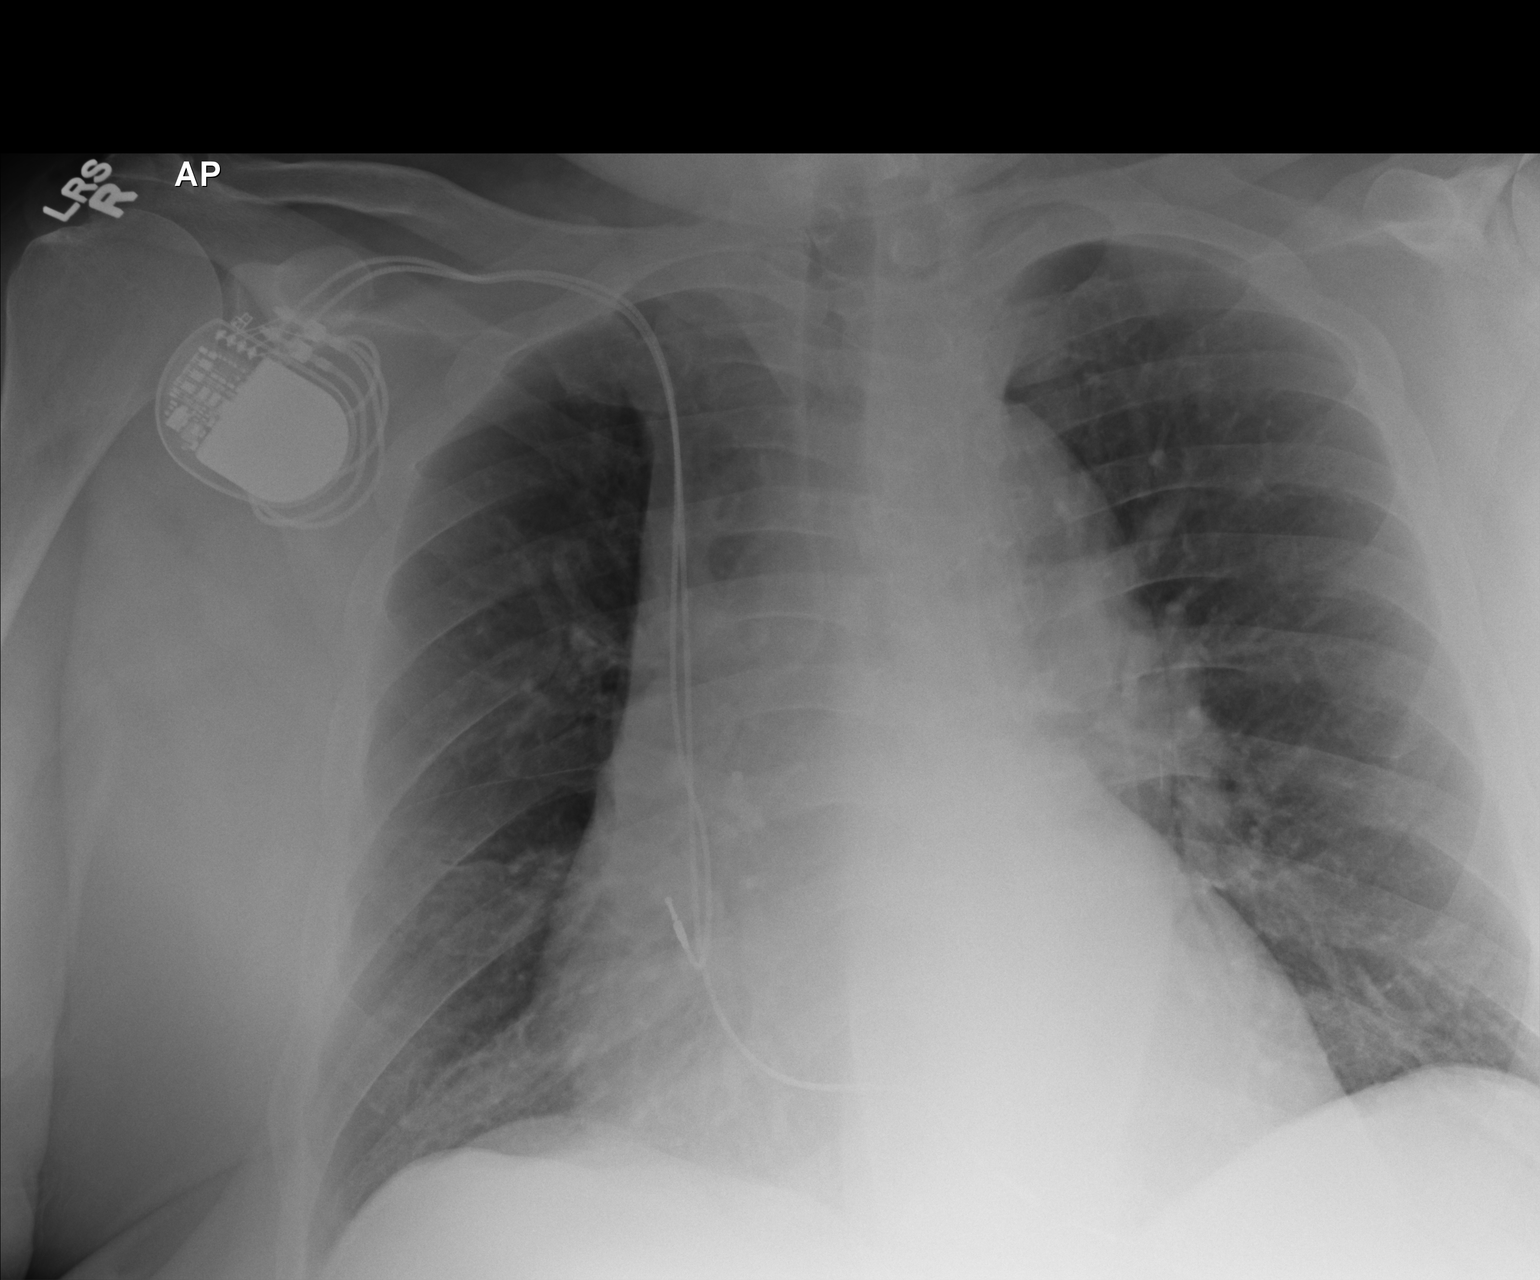

[1 of 1 positions shown; findings below may reference images not displayed]

FINDINGS: The lungs are well-aerated. Pulmonary vascularity is at the upper
limits of normal. There is no evidence of focal opacification,
pleural effusion or pneumothorax.

The cardiomediastinal silhouette is borderline enlarged. A pacemaker
is seen overlying the right chest wall, with leads ending overlying
the right atrium and right ventricle. No acute osseous abnormalities
are seen.
IMPRESSION: Borderline cardiomegaly; lungs remain grossly clear.

## 2016-01-12 LAB — CUP PACEART INCLINIC DEVICE CHECK
Battery Impedance: 178 Ohm
Battery Remaining Longevity: 118 mo
Brady Statistic AP VP Percent: 4 %
Brady Statistic AS VP Percent: 0 %
Brady Statistic AS VS Percent: 2 %
Date Time Interrogation Session: 20170221153253
Implantable Lead Implant Date: 20050310
Implantable Lead Location: 753859
Implantable Lead Model: 5092
Lead Channel Setting Pacing Amplitude: 1.5 V
Lead Channel Setting Pacing Amplitude: 4 V
Lead Channel Setting Sensing Sensitivity: 5.6 mV
MDC IDC LEAD IMPLANT DT: 20050301
MDC IDC LEAD LOCATION: 753860
MDC IDC MSMT BATTERY VOLTAGE: 2.79 V
MDC IDC MSMT LEADCHNL RA IMPEDANCE VALUE: 429 Ohm
MDC IDC MSMT LEADCHNL RV IMPEDANCE VALUE: 746 Ohm
MDC IDC SET LEADCHNL RV PACING PULSEWIDTH: 1 ms
MDC IDC STAT BRADY AP VS PERCENT: 94 %

## 2016-01-13 IMAGING — CT CT HEAD W/O CM
2 series · 16 of 30 positions shown, 18 images · non-contrast
Comparison: None.

CLINICAL DATA: Aphasic

EXAM:
CT HEAD WITHOUT CONTRAST
TECHNIQUE: Contiguous axial images were obtained from the base of the skull
through the vertex without intravenous contrast.

[Series 2: head w/o · axial · non-contrast · 0.49mm/px · z∈[+854,+994]mm · 8 of 36 slices shown, 10 images]
[im 4/36  brain]
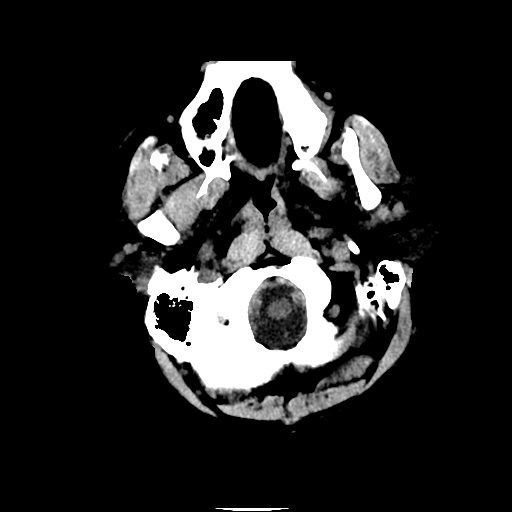
[im 4/36  bone]
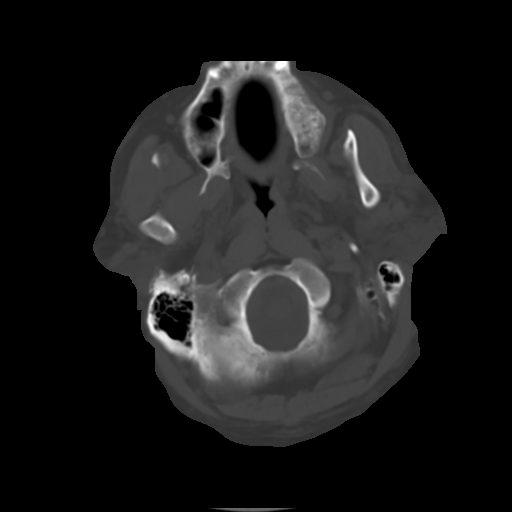
[im 8/36  brain]
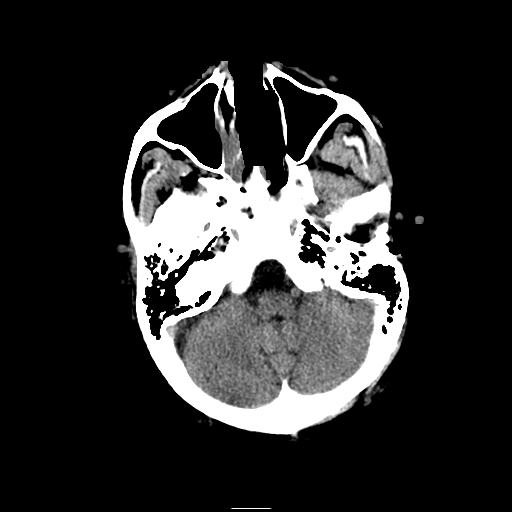
[im 12/36  brain]
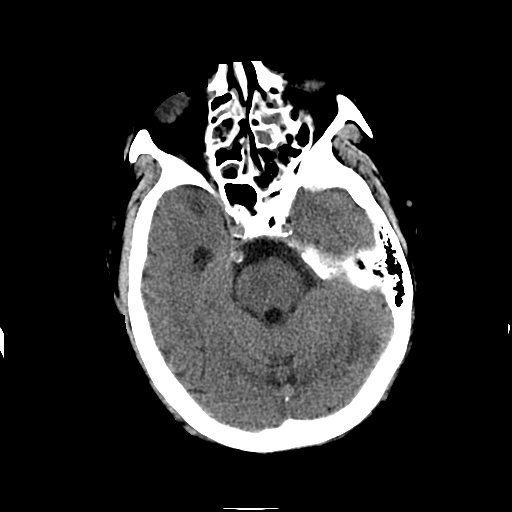
[im 16/36  brain]
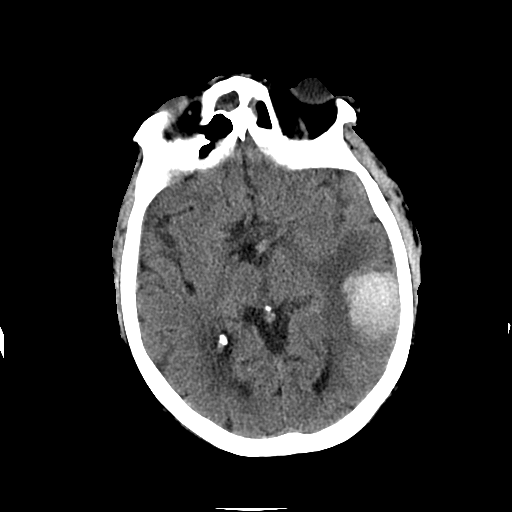
[im 20/36  brain]
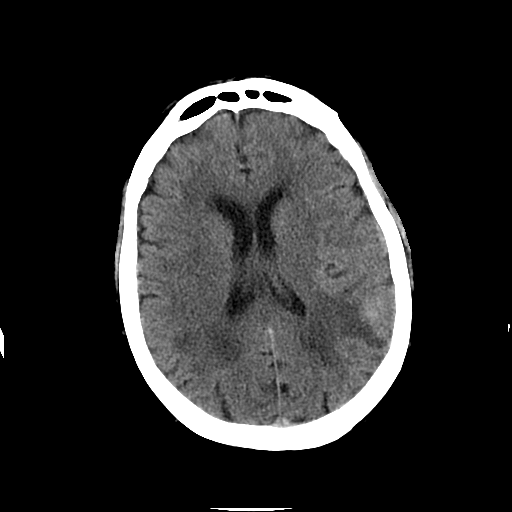
[im 20/36  bone]
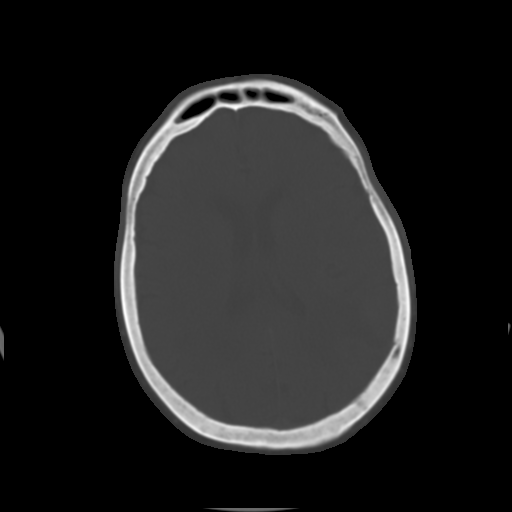
[im 24/36  brain]
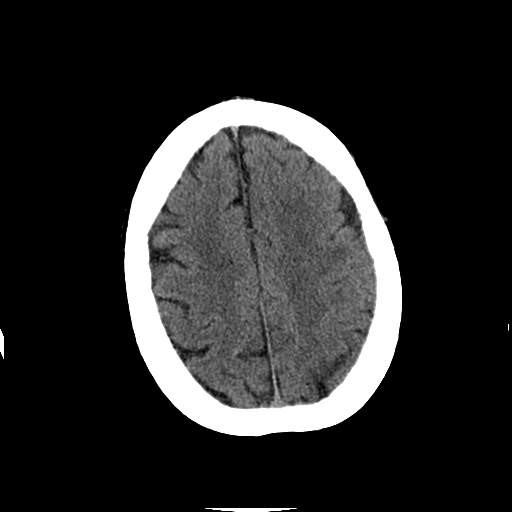
[im 28/36  brain]
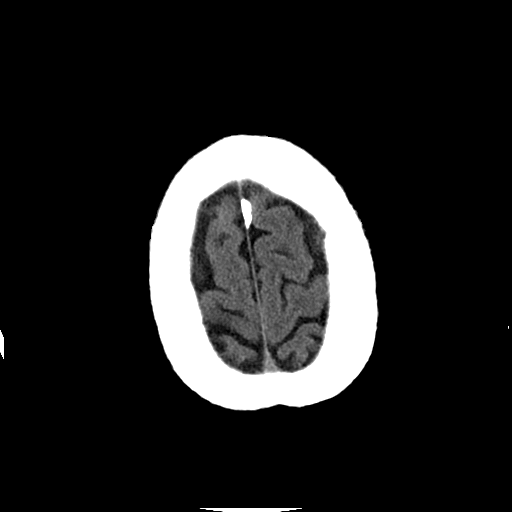
[im 32/36  brain]
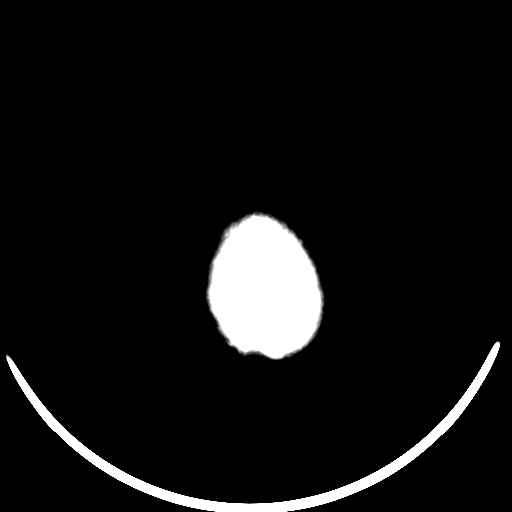

[Series 3: head w/o bone · axial · non-contrast · 0.49mm/px · z∈[+857,+994]mm · 8 of 71 slices shown]
[im 8/71  bone]
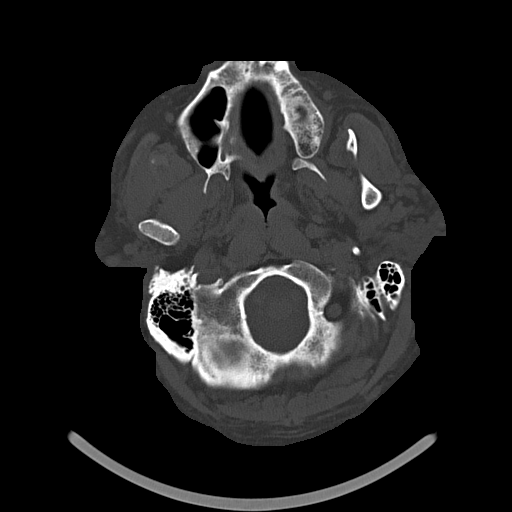
[im 15/71  bone]
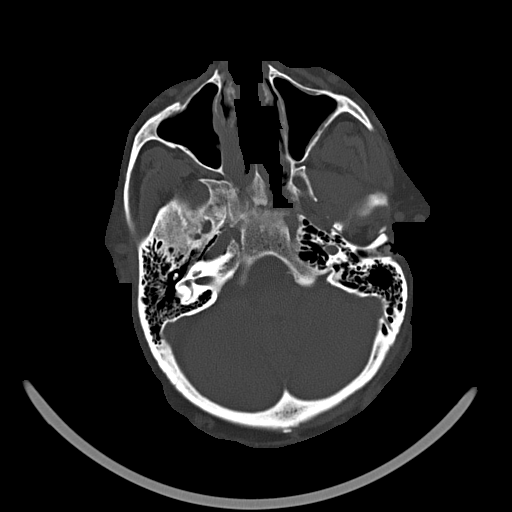
[im 23/71  bone]
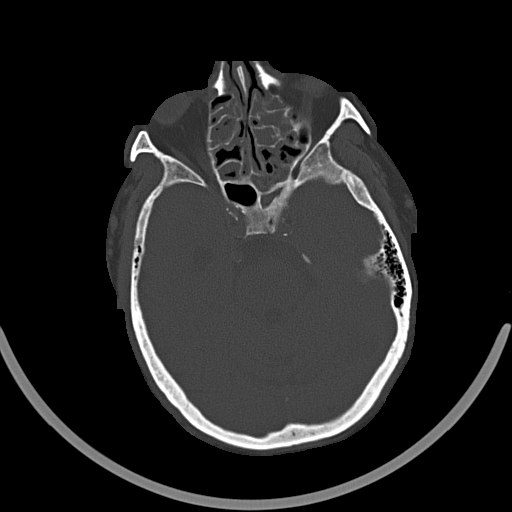
[im 30/71  bone]
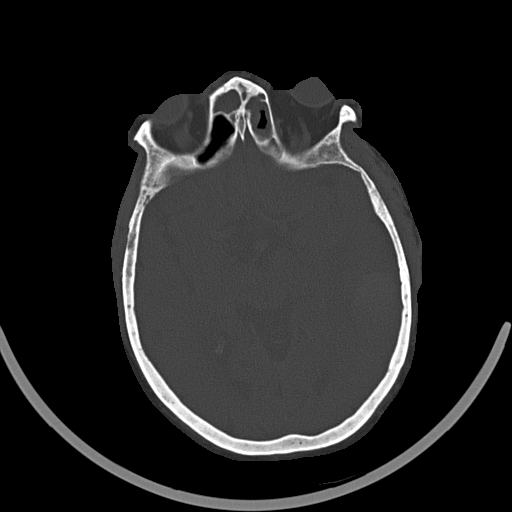
[im 41/71  bone]
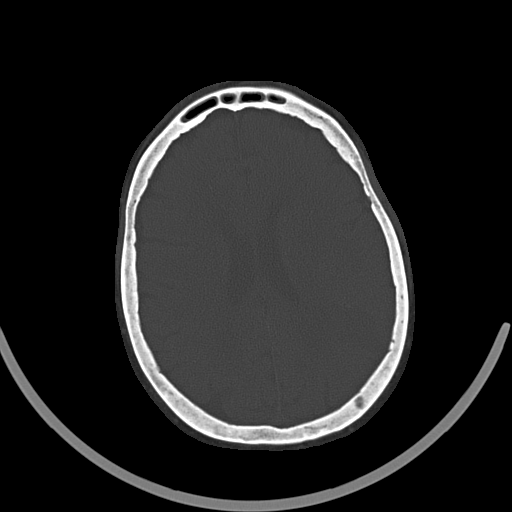
[im 48/71  bone]
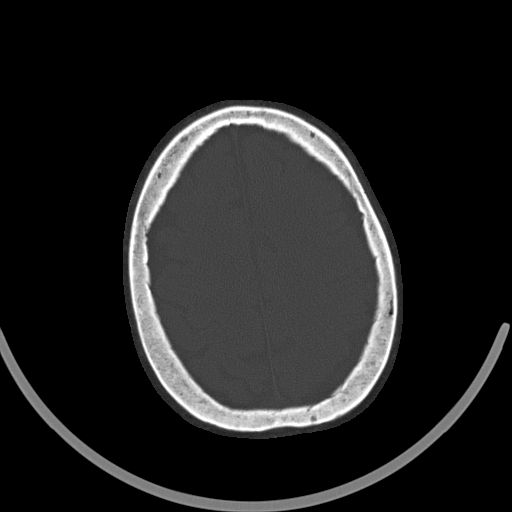
[im 56/71  bone]
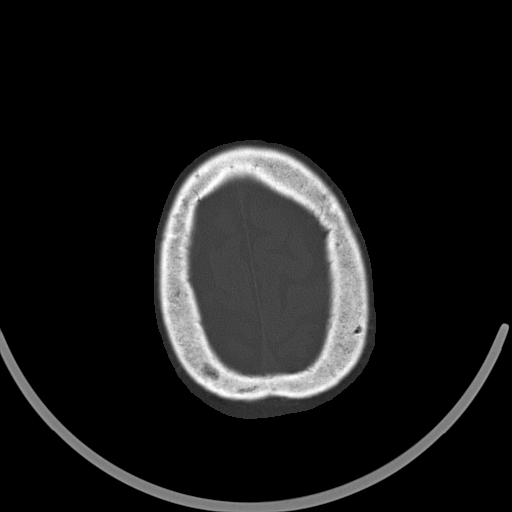
[im 63/71  bone]
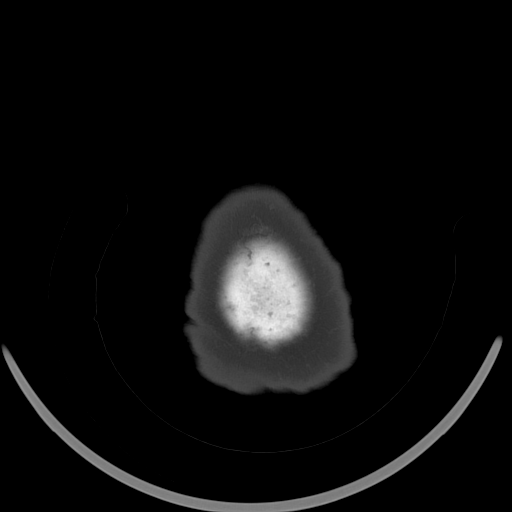

[16 of 30 positions shown; findings below may reference images not displayed]

FINDINGS: Skull and Sinuses:There is patchy mucosal thickening in the
bilateral paranasal sinuses, worse in the ethmoid and frontal
sinuses.

Orbits: Bilateral cataract resection.

Brain: 4 x 2.8 cm parenchymal hematoma in the left temporoparietal
cortex and subcortical white matter. There is a tiny calcification
or separate hemorrhage in the more deep white matter. There is
surrounding edema with local mass effect; no midline shift or
herniation. In this location, hemorrhagic infarct or amyloid
angiopathy are the most likely causes.

Age advanced chronic small vessel ischemic injury with diffuse
cerebral white matter low-attenuation. No hydrocephalus. Generalized
cerebral volume loss.

Critical Value/emergent results were called by telephone at the time
of interpretation on 10/01/2013 at [DATE] to Dr. EGDAR BLUE ,
who verbally acknowledged these results.
IMPRESSION: 1. 4 x 3 cm left temporal/parietal parenchymal hematoma.
2. Advanced chronic small vessel ischemic white matter disease.
3. Diffuse inflammatory paranasal sinus disease.

## 2016-01-13 IMAGING — CR DG CHEST 1V PORT
1 series · 1 of 1 positions shown · non-contrast
Comparison: DG CHEST 1V PORT dated 09/05/2013

CLINICAL DATA: Cough, history of coronary artery disease and
hypertension.

EXAM:
PORTABLE CHEST - 1 VIEW

[AP]
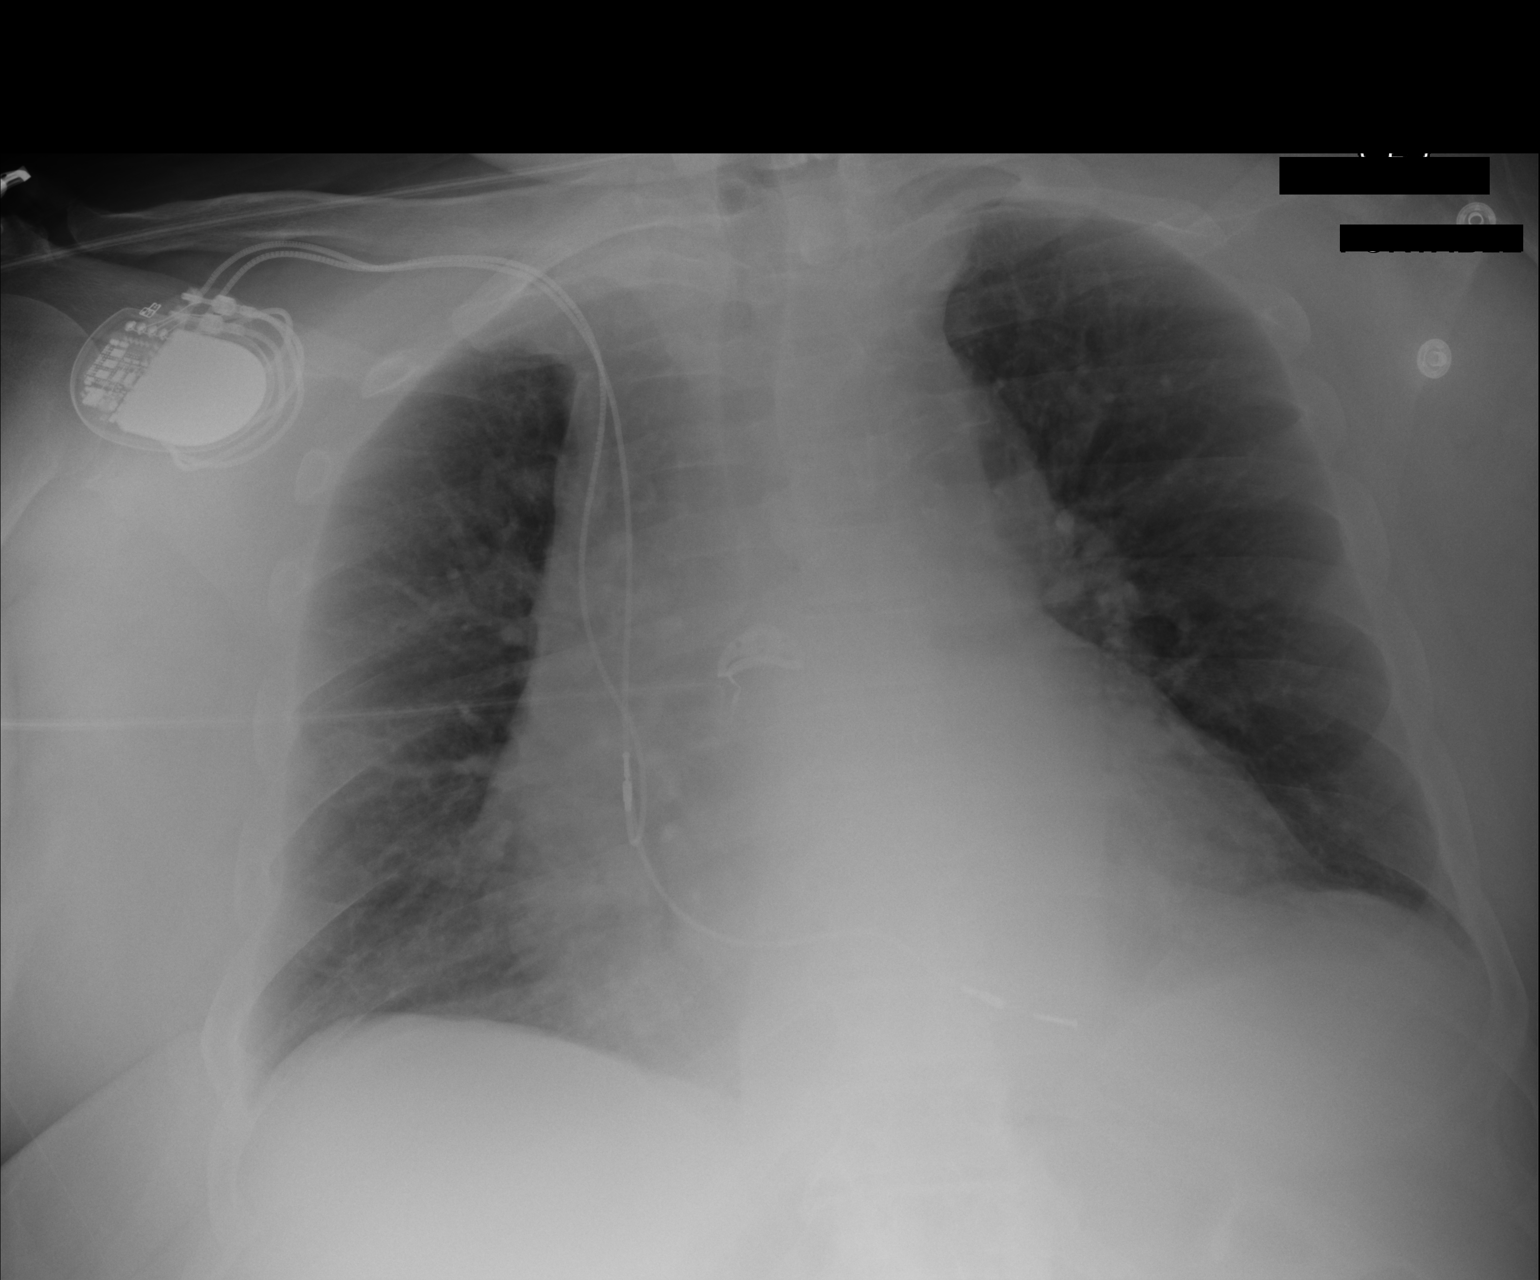

[1 of 1 positions shown; findings below may reference images not displayed]

FINDINGS: The lungs are adequately inflated. There is no focal infiltrate. The
cardiac silhouette remains enlarged. The pulmonary vascularity is
more prominent today than previously. The permanent pacemaker
appears unchanged. There is tortuosity of the descending thoracic
aorta.
IMPRESSION: The appearance of the pulmonary interstitium suggests mild
interstitial edema likely secondary to CHF. There is no evidence of
pneumonia or pleural effusion.

## 2016-01-14 IMAGING — CT CT HEAD W/O CM
1 of 2 series · 13 of 30 positions shown, 17 images · non-contrast
Comparison: CT of the head October 01, 2013

CLINICAL DATA: Altered mental status. Follow-up intracranial
hemorrhage.

EXAM:
CT HEAD WITHOUT CONTRAST
TECHNIQUE: Contiguous axial images were obtained from the base of the skull
through the vertex without intravenous contrast.

[Series 1: — · axial · 0.49mm/px · z∈[-156,-46]mm · 13 of 26 slices shown, 17 images]
[im 2/26  brain]
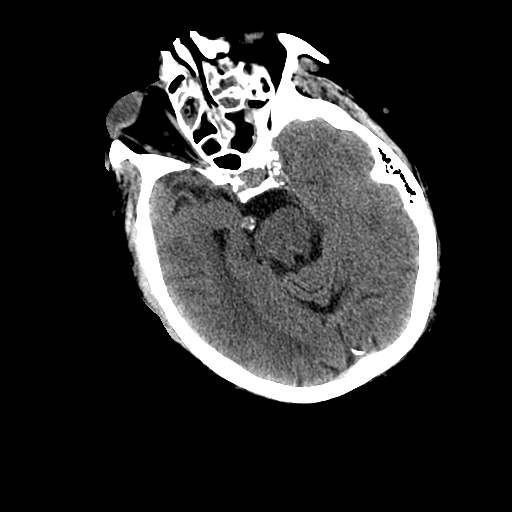
[im 2/26  bone]
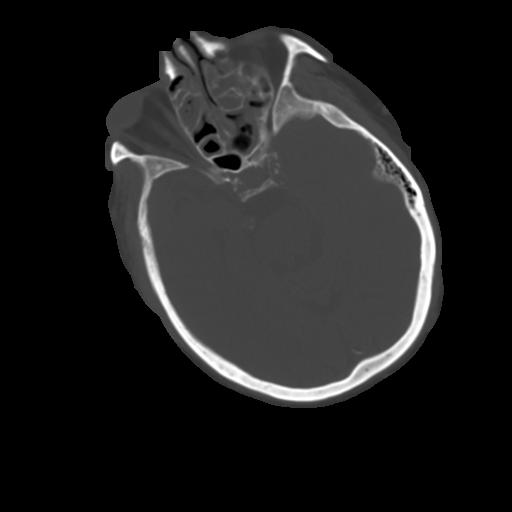
[im 4/26  brain]
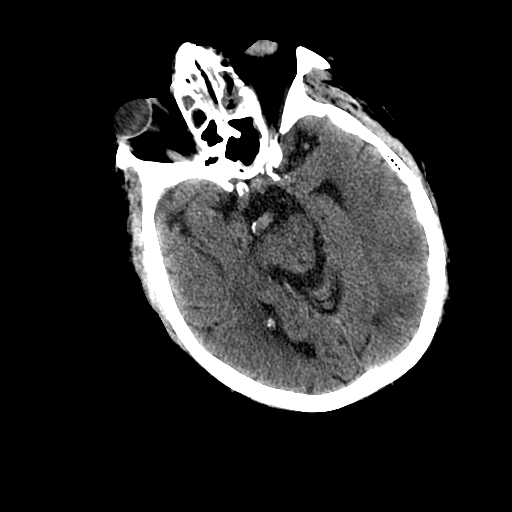
[im 6/26  brain]
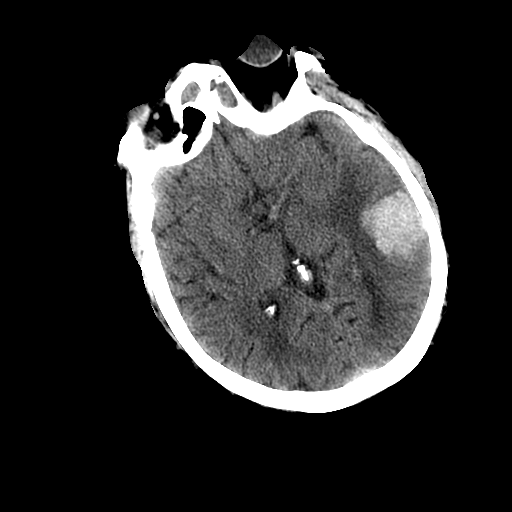
[im 8/26  brain]
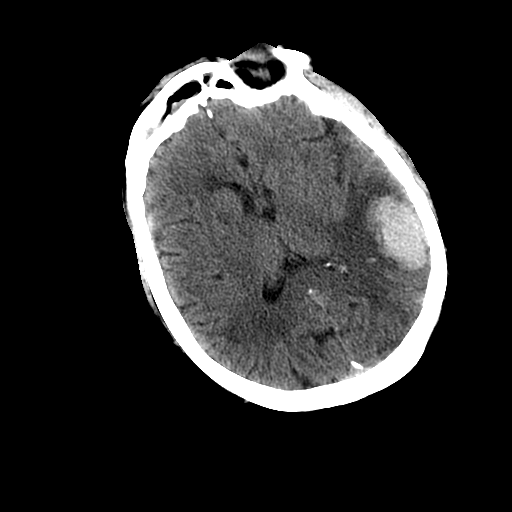
[im 9/26  brain]
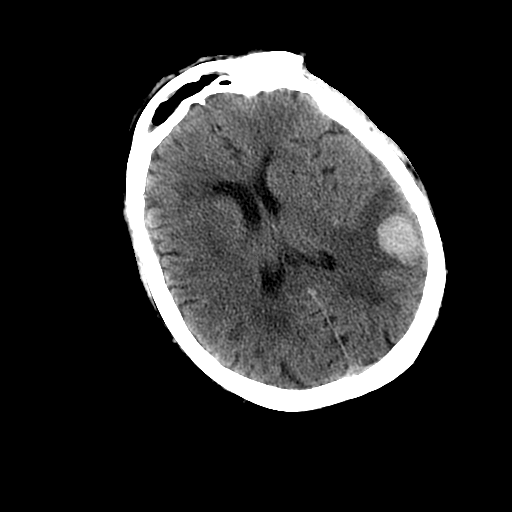
[im 9/26  bone]
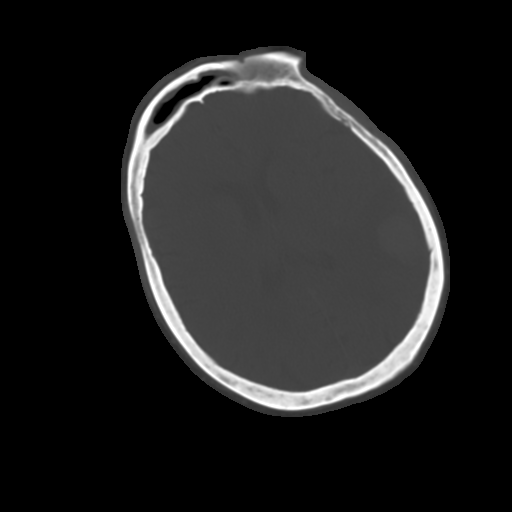
[im 11/26  brain]
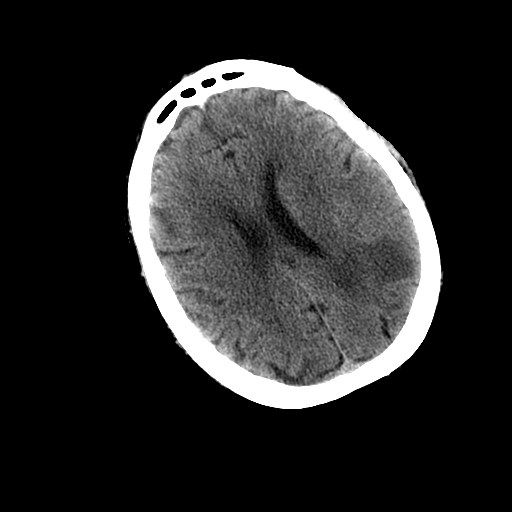
[im 13/26  brain]
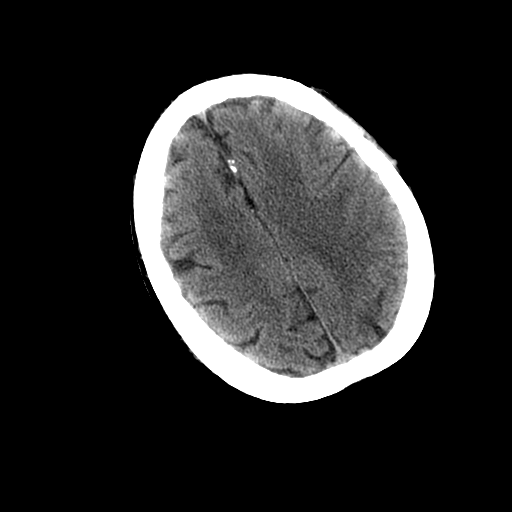
[im 15/26  brain]
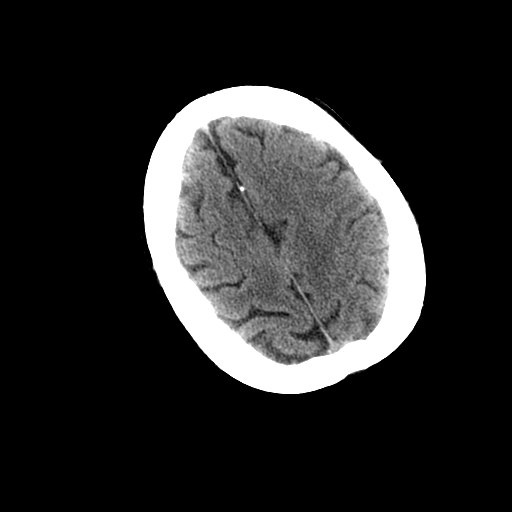
[im 17/26  brain]
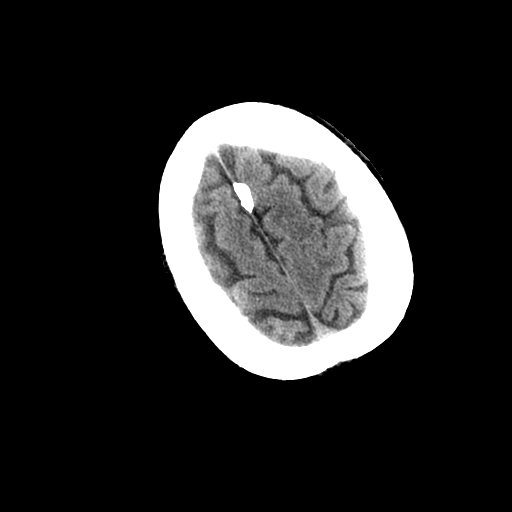
[im 17/26  bone]
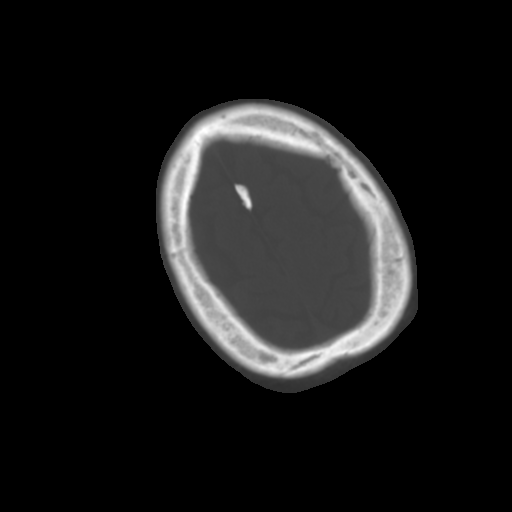
[im 18/26  brain]
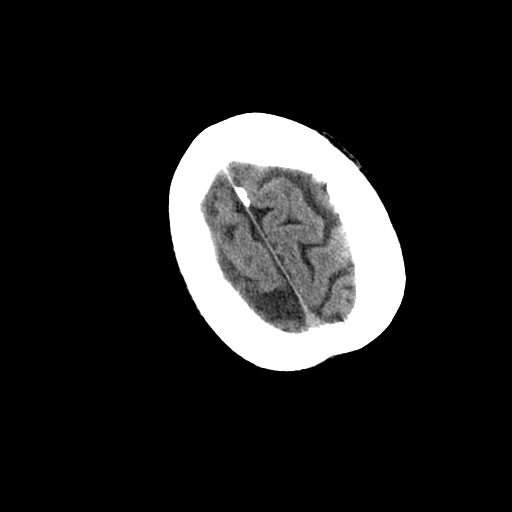
[im 20/26  brain]
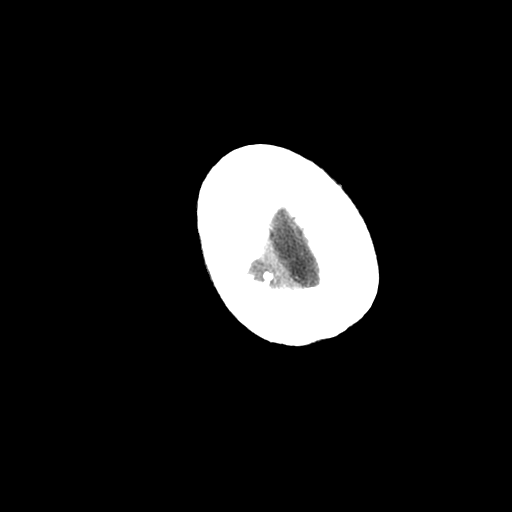
[im 22/26  brain]
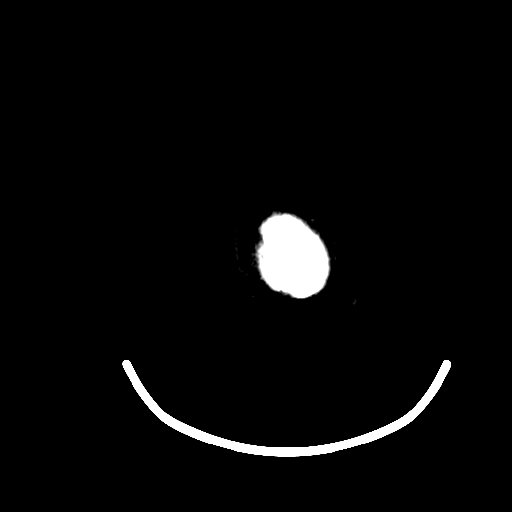
[im 24/26  brain]
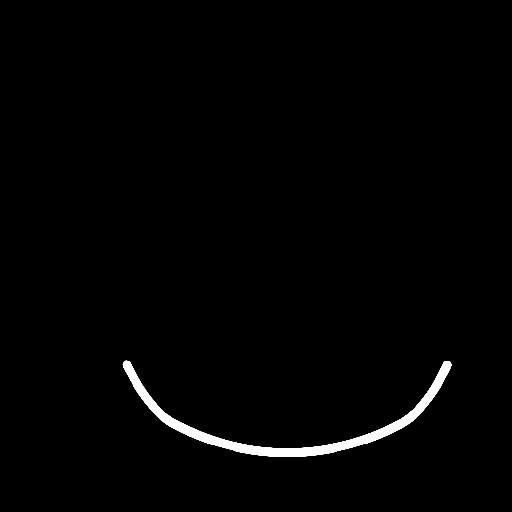
[im 24/26  bone]
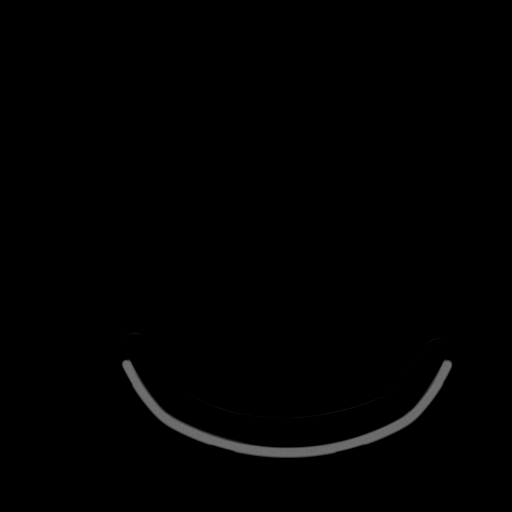

[13 of 30 positions shown; findings below may reference images not displayed]

FINDINGS: Skull base/posterior fossa is not imaged on today's study.

Similar appearance of left temporoparietal 2.8 x 4 cm (previously
2.8 x 4.1 cm) intraparenchymal hematoma with surrounding low-density
vasogenic edema. Local mass effect without midline shift. No new
hemorrhage. No hydrocephalus.

Patchy to confluent supratentorial white matter hypodensities
exclusive of the vasogenic edema suggest chronic small vessel
ischemic disease. No acute large vascular territory infarct.

No abnormal extra-axial fluid collections. Pan paranasal sinusitis.
Status post bilateral ocular lens implants, with apparent proptosis,
recommend clinical correlation.
IMPRESSION: Limited portable CT: Skullbase and posterior fossa not been
included.

Evolving left temporoparietal intraparenchymal hematoma with similar
mass effect. No new hemorrhage.

  By: Oneman Belove

## 2016-01-14 IMAGING — CR DG ABD PORTABLE 1V
1 series · 1 of 1 positions shown · non-contrast
Comparison: None.

CLINICAL DATA: Orogastric tube placement

EXAM:
PORTABLE ABDOMEN - 1 VIEW

[AP]
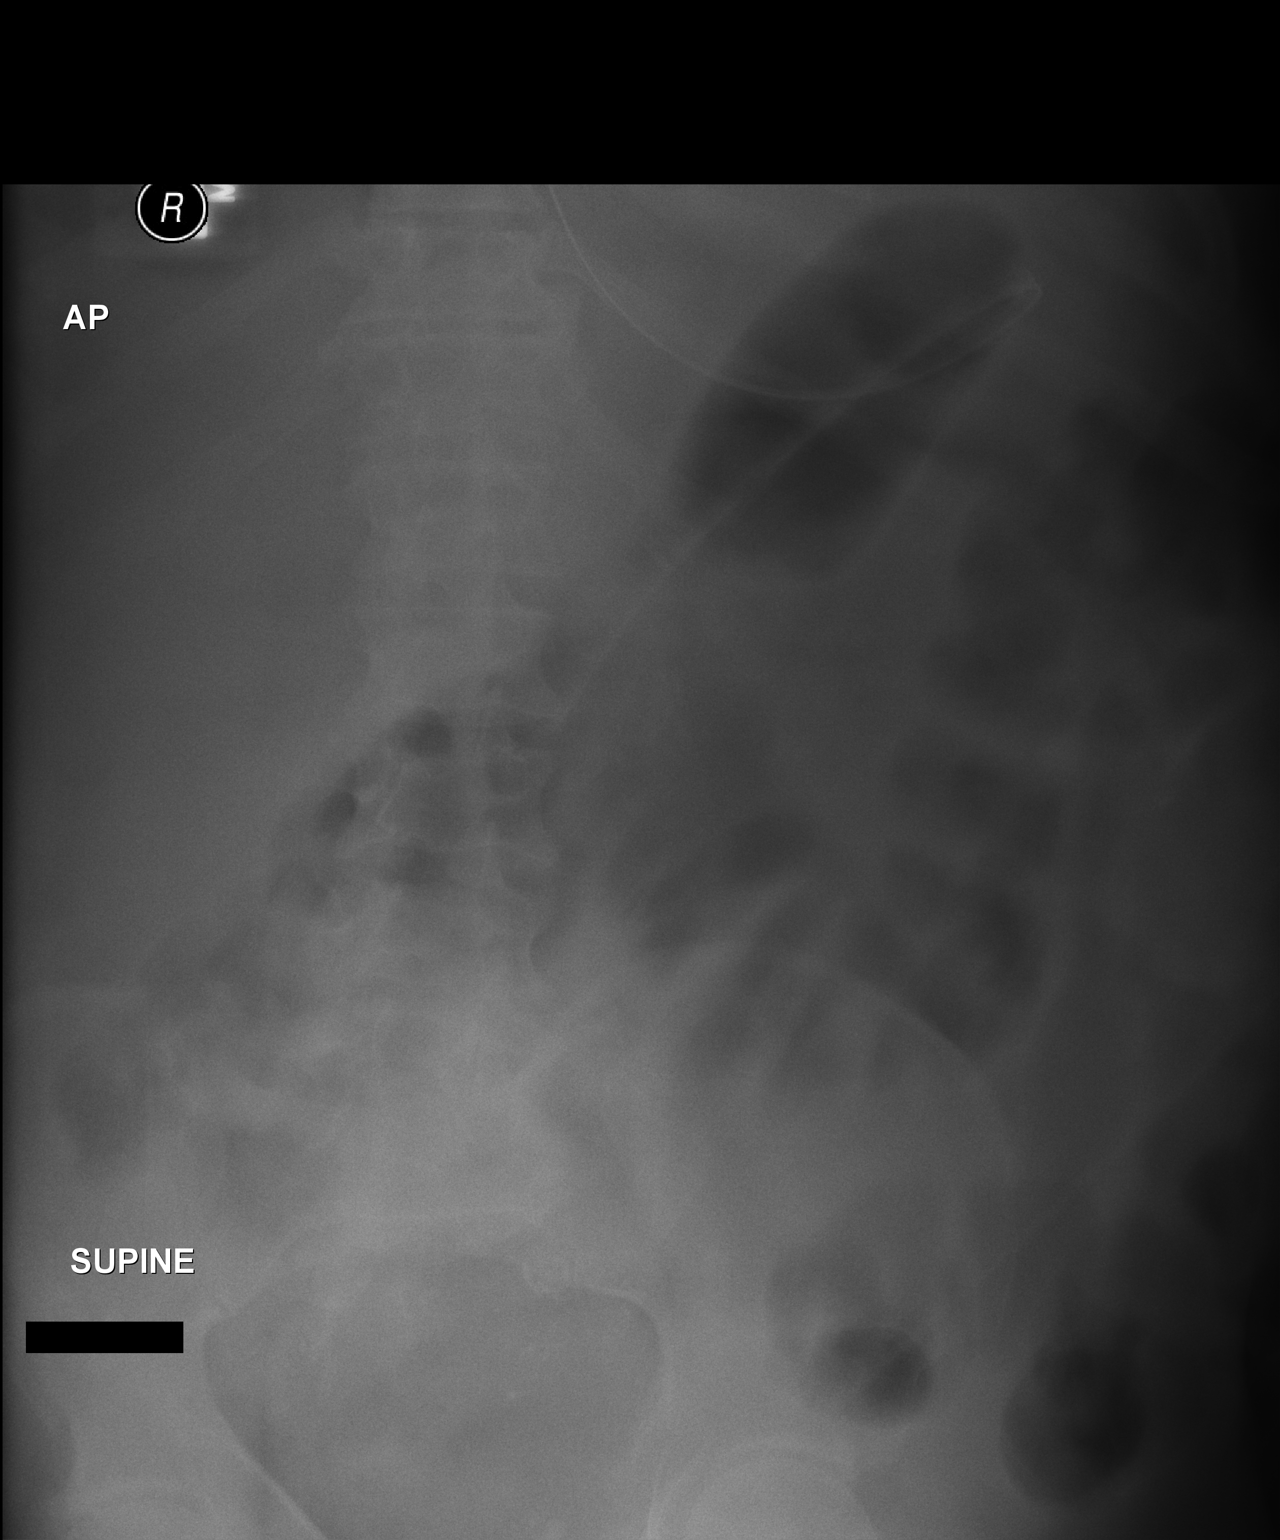

[1 of 1 positions shown; findings below may reference images not displayed]

FINDINGS: Orogastric tube tip and side port are in the stomach. Bowel gas
pattern is overall unremarkable. No free air seen on this supine
examination.
IMPRESSION: Orogastric tube tip and side port in stomach.

## 2016-01-14 IMAGING — CR DG CHEST 1V PORT
1 series · 1 of 1 positions shown · non-contrast
Comparison: 10/01/2013

CLINICAL DATA: Intubation.  Central line placement.

EXAM:
PORTABLE CHEST - 1 VIEW

[AP]
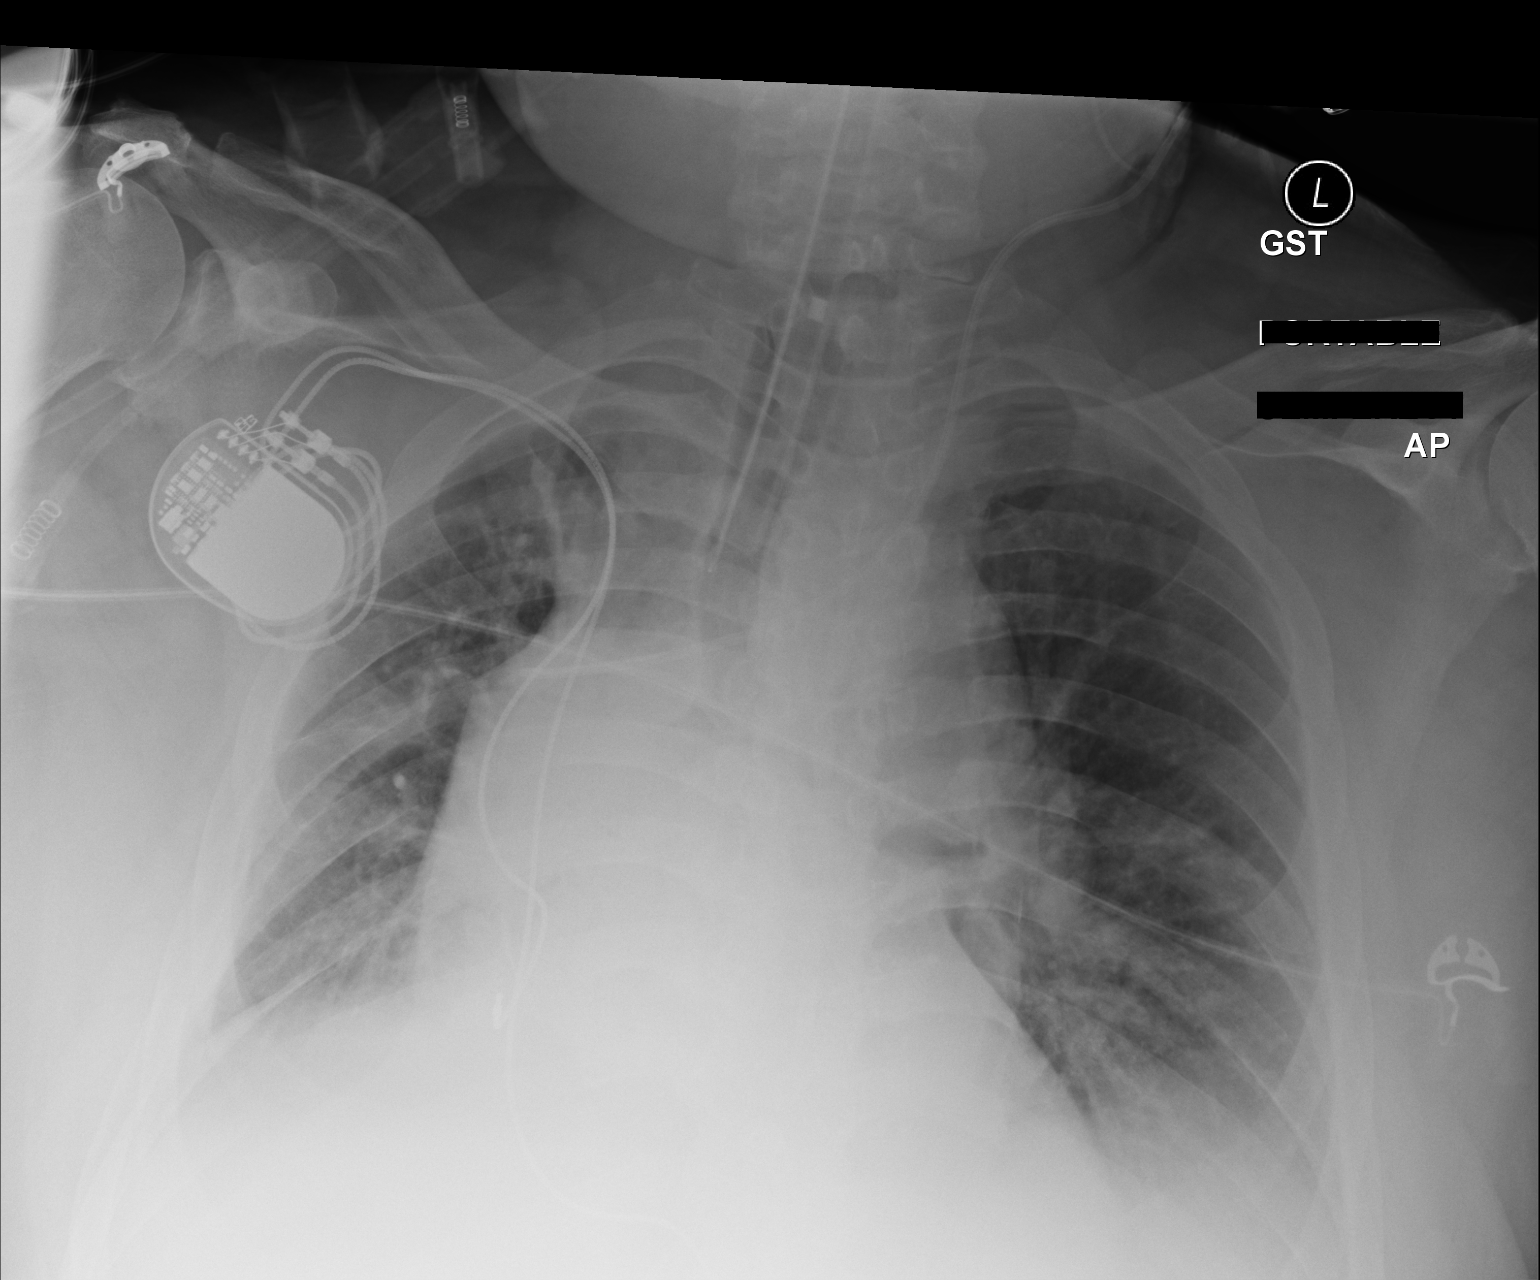

[1 of 1 positions shown; findings below may reference images not displayed]

FINDINGS: Endotracheal tube is 4 cm above the carina. Left internal jugular
central line is in place with the tip directed laterally in the
upper SVC. No pneumothorax.

There is cardiomegaly with vascular congestion and bibasilar
opacities, likely a combination of effusions and atelectasis. Right
pacer remains in place, unchanged.
IMPRESSION: Endotracheal tube 4 cm above the carina. Left central line tip
projected laterally towards the lateral wall of the upper SVC. No
pneumothorax.

Cardiomegaly with vascular congestion.

Small effusions and bibasilar atelectasis.

## 2016-01-15 IMAGING — CR DG CHEST 1V PORT
1 series · 1 of 1 positions shown · non-contrast
Comparison: October 02, 2013

CLINICAL DATA: Hypoxia

EXAM:
PORTABLE CHEST - 1 VIEW

[AP]
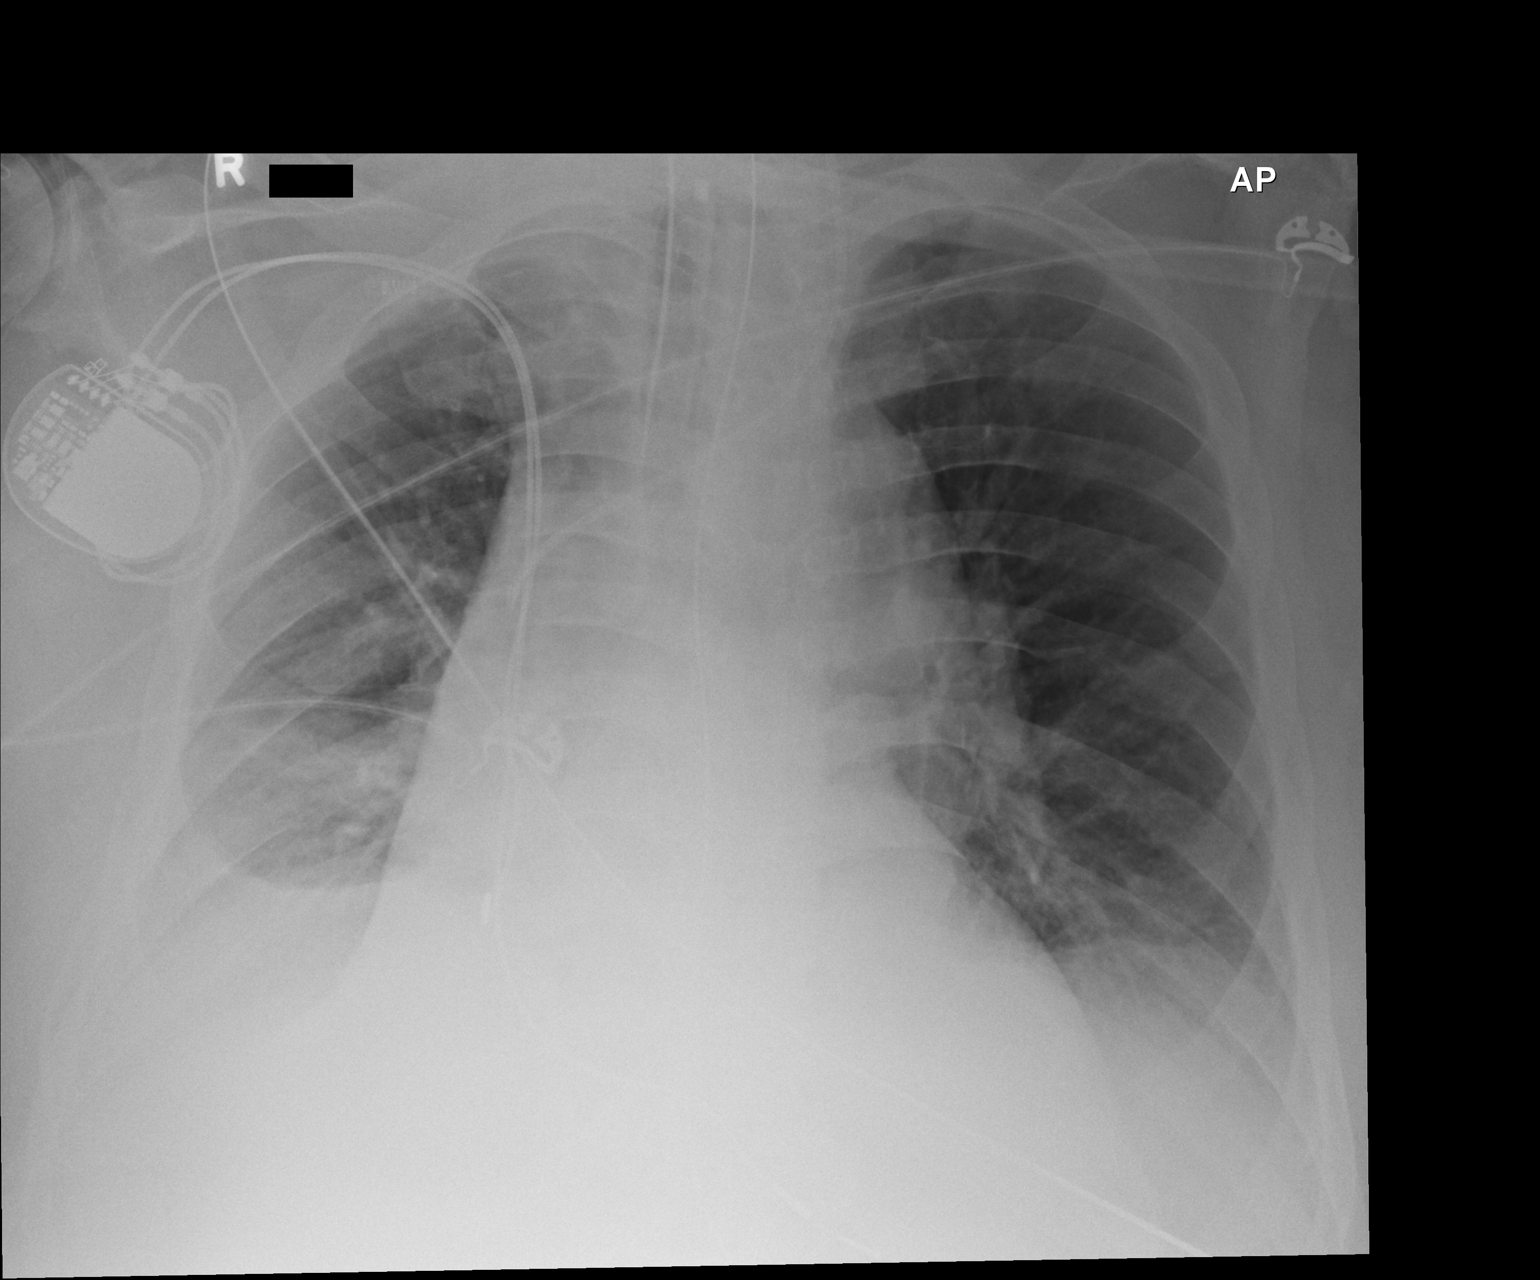

[1 of 1 positions shown; findings below may reference images not displayed]

FINDINGS: Endotracheal tube tip is 3.9 cm above the carina. Nasogastric tube
tip and side port are in the stomach. Left jugular catheter tip is
in the superior vena cava. No pneumothorax.

There is a right pleural effusion with right base atelectasis. Lungs
are otherwise clear. Heart is mildly enlarged with normal pulmonary
vascularity. Pacemaker leads are attached to the right atrium and
right ventricle.
IMPRESSION: Fairly small right pleural effusion with right base atelectasis.
Lungs are otherwise clear. Tube and catheter positions as described
without pneumothorax.

## 2016-01-15 IMAGING — CT CT HEAD W/O CM
1 series · 15 of 30 positions shown, 19 images · non-contrast
Comparison: CT PORT HEAD WO CM dated 10/02/2013; CT HEAD W/O CM
dated 10/01/2013

CLINICAL DATA: Follow-up evaluation.

EXAM:
CT HEAD WITHOUT CONTRAST
TECHNIQUE: Contiguous axial images were obtained from the base of the skull
through the vertex without intravenous contrast.

[Series 2: head 5.0 h30s · axial · 0.41mm/px · z∈[-203,-68]mm · 15 of 30 slices shown, 19 images]
[im 2/30  brain]
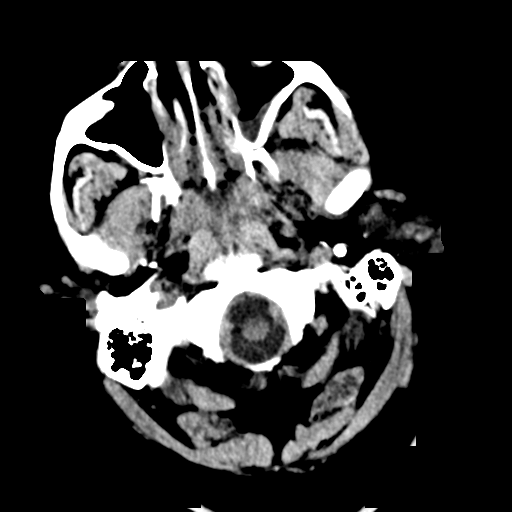
[im 2/30  bone]
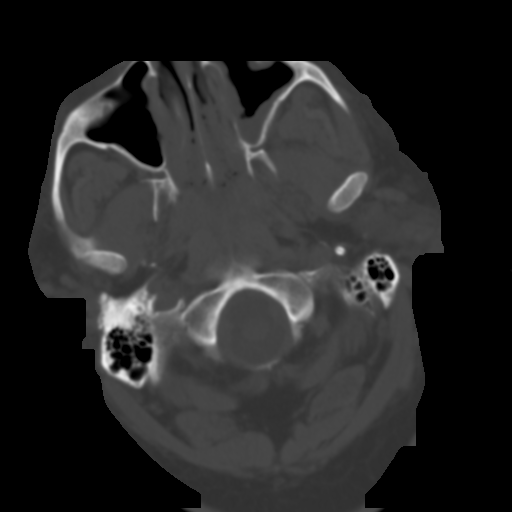
[im 4/30  brain]
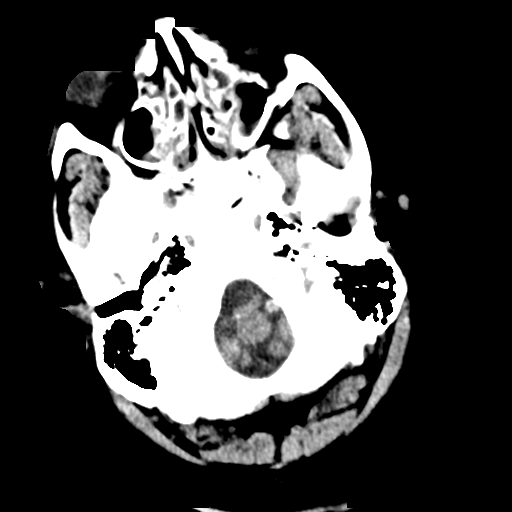
[im 6/30  brain]
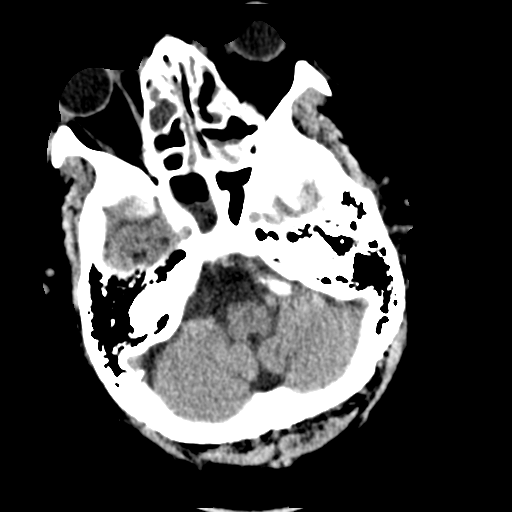
[im 8/30  brain]
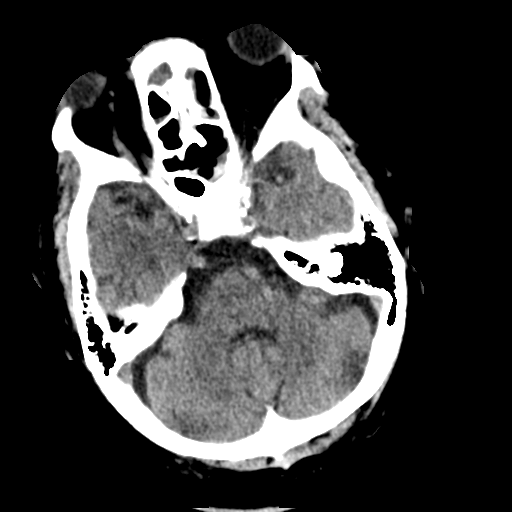
[im 10/30  brain]
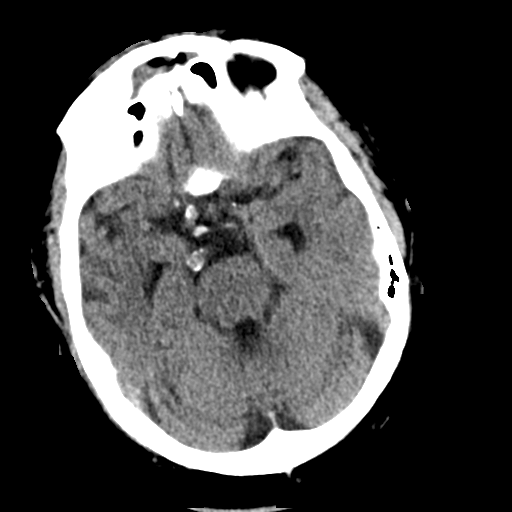
[im 10/30  bone]
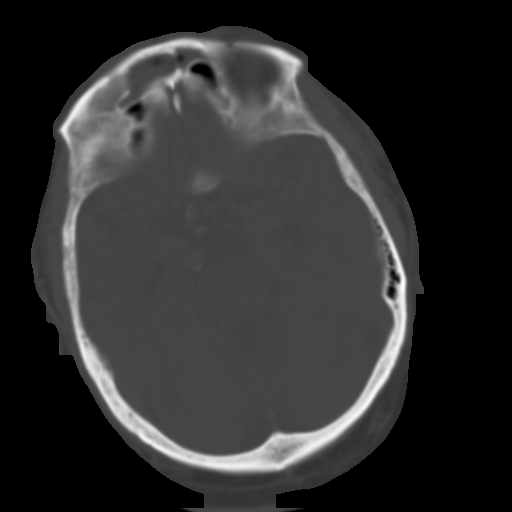
[im 12/30  brain]
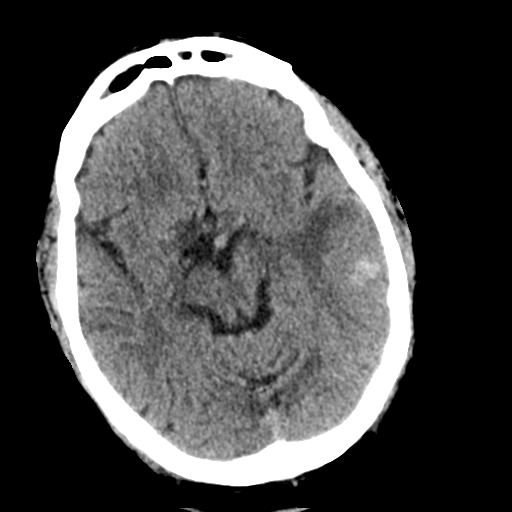
[im 14/30  brain]
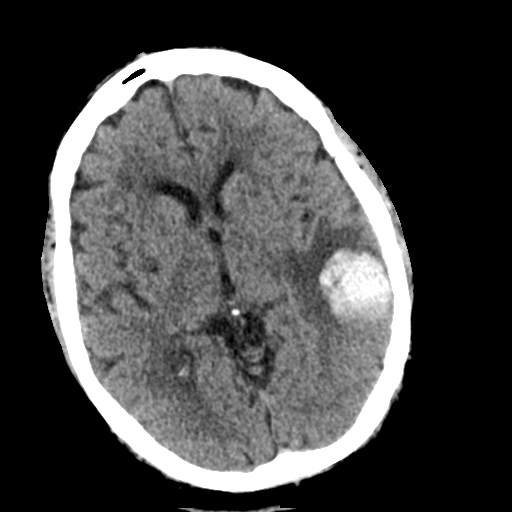
[im 16/30  brain]
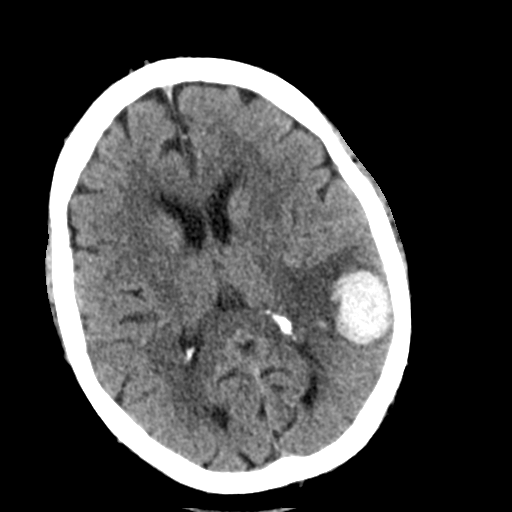
[im 17/30  brain]
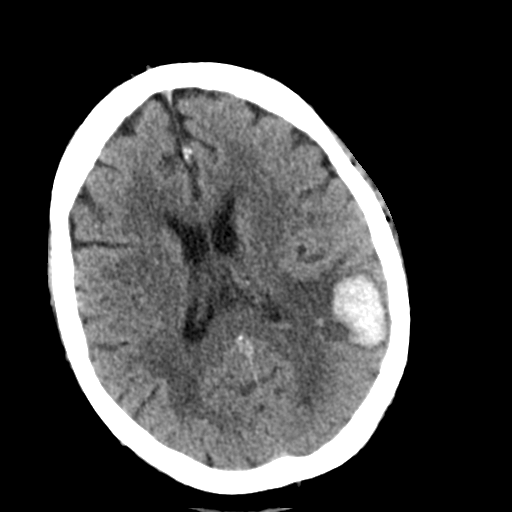
[im 17/30  bone]
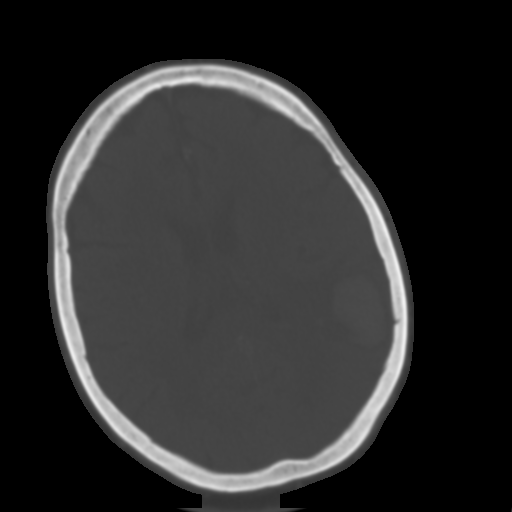
[im 19/30  brain]
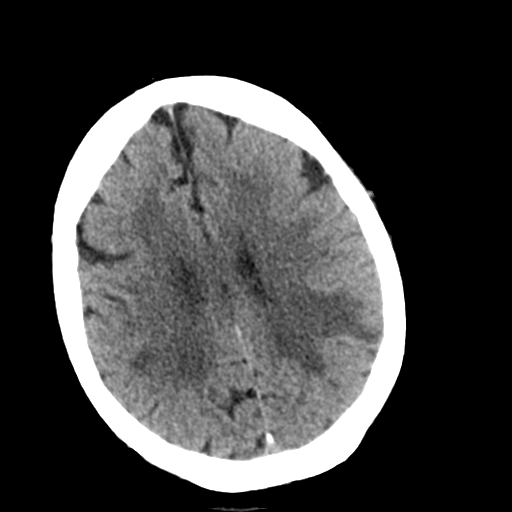
[im 21/30  brain]
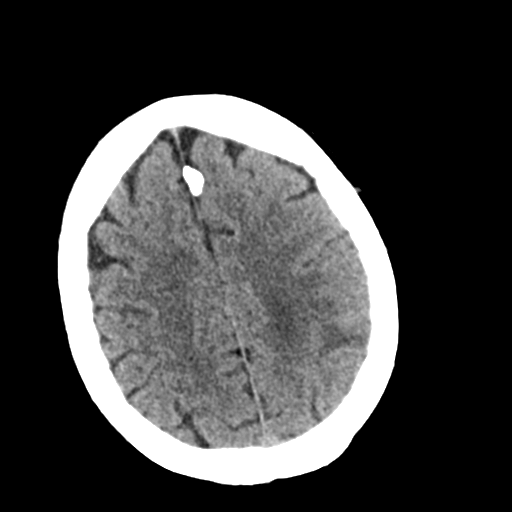
[im 23/30  brain]
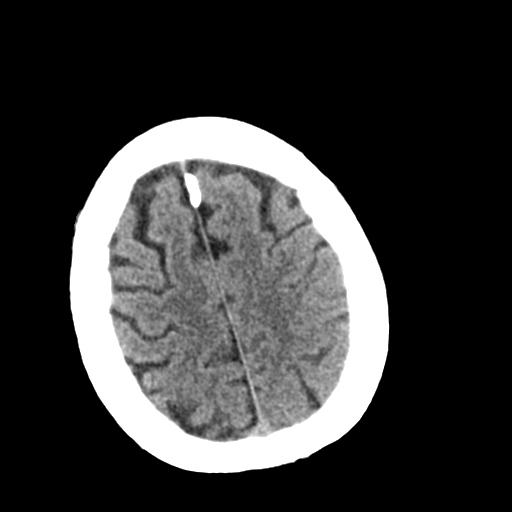
[im 25/30  brain]
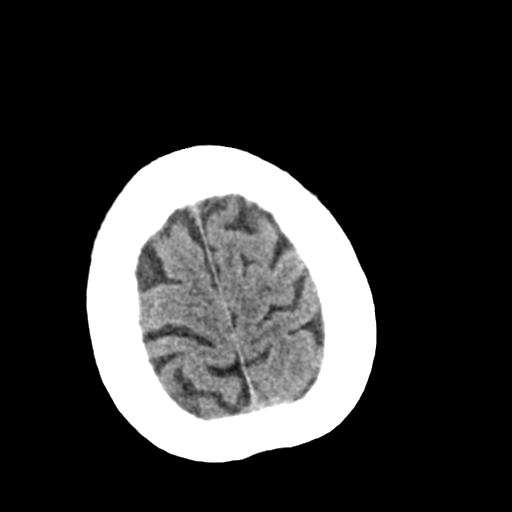
[im 25/30  bone]
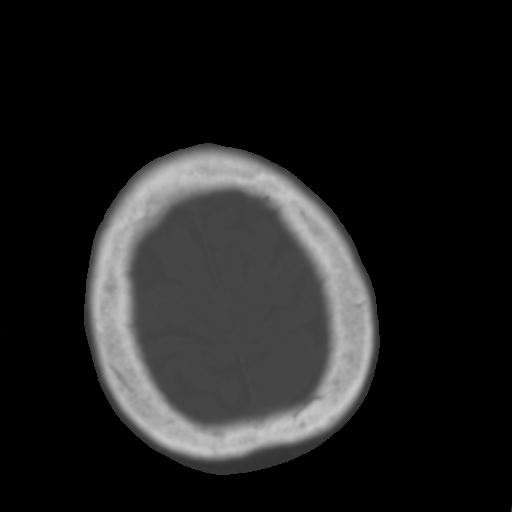
[im 27/30  brain]
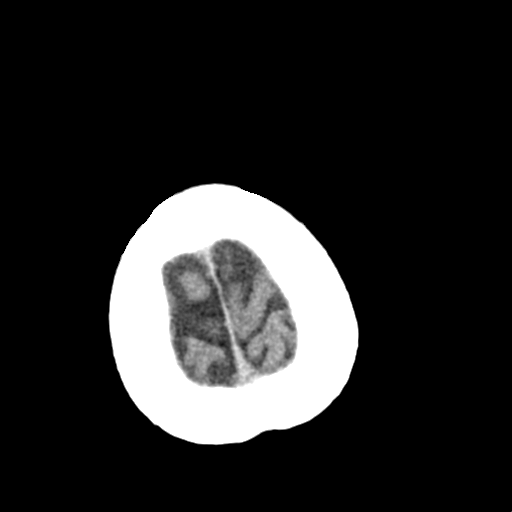
[im 29/30  brain]
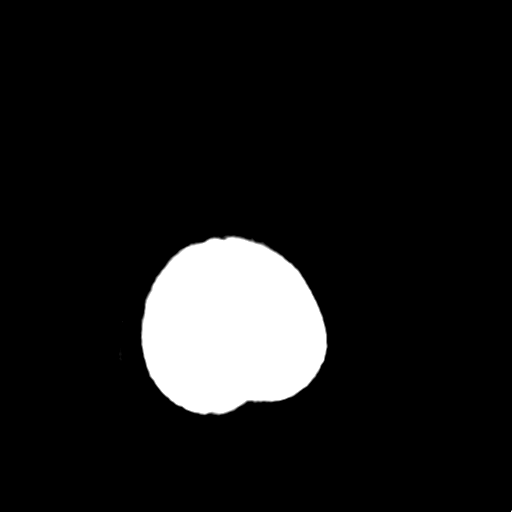

[15 of 30 positions shown; findings below may reference images not displayed]

FINDINGS: Left temporal parietal 2.7 x 3 point cm a intraparenchymal hematoma
was 2.8 x 4 cm. Similar surrounding low-density vasogenic edema with
mild mass effect.

The ventricles and sulci are overall normal for patient's age. Mild
prominence of the cerebellar folia, somewhat advanced for age.
Patchy supratentorial white matter hypodensities exclusive of the
vasogenic edema suggests chronic small vessel ischemic disease.

No abnormal extra-axial fluid collections. Basal cisterns are
patent. Dolichoectasia of the basilar artery may reflect chronic
hypertension. Moderate calcific atherosclerosis of the intracranial
vessels.

Chronic paranasal sinus mucosal thickening without air-fluid levels.
Mastoid air cells appear well-aerated. Soft tissue within the
external auditory canals likely reflects cerumen. Suspected
proptosis, recommend clinical correlation.
IMPRESSION: Contracting left temporal parietal intraparenchymal hematoma, no
rebleed.

Moderate white matter changes suggest chronic small vessel ischemic
disease.

  By: Kusum Inman

## 2016-01-15 IMAGING — DX DG CHEST 1V PORT
1 series · 1 of 1 positions shown · non-contrast
Comparison: Chest 10/03/2013 at [DATE] a.m..

CLINICAL DATA: Intubation.

EXAM:
PORTABLE CHEST - 1 VIEW

[portable]
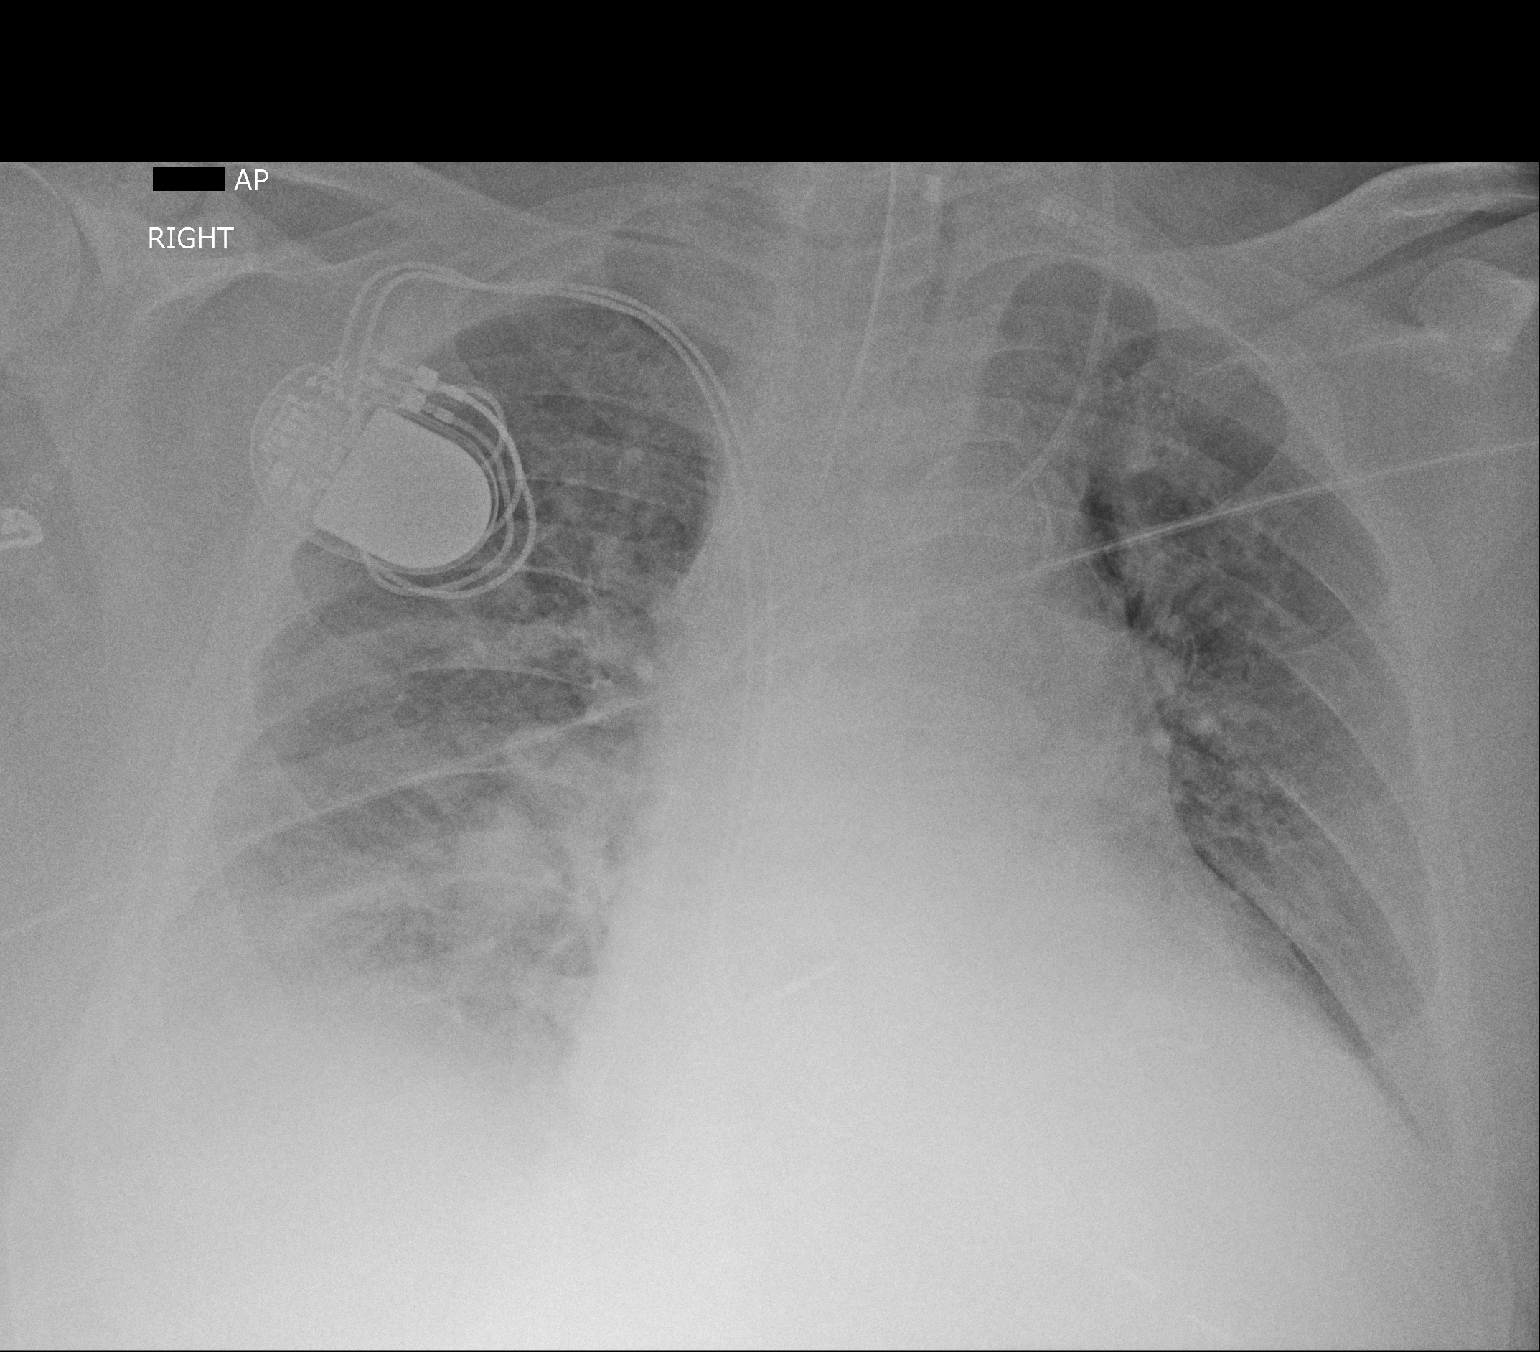

[1 of 1 positions shown; findings below may reference images not displayed]

FINDINGS: Endotracheal tube is in place with tip in good position 3.9 cm above
the carina. Left IJ catheter is again seen with the tip projecting
over the mid superior vena cava. Right effusion basilar airspace
disease persist. There is increased left basilar atelectasis. There
is cardiomegaly. No pneumothorax.
IMPRESSION: ET tube in good position.

No change in right effusion and basilar airspace disease. Left
basilar atelectasis is noted.

## 2016-01-16 IMAGING — CT CT ANGIO CHEST
2 of 7 series · 18 of 36 positions shown · IV contrast (CONTRAST)
Comparison: One-view chest x-ray 10/04/2013.

CLINICAL DATA: Abnormal echocardiogram. Question pulmonary embolus.

EXAM:
CT ANGIOGRAPHY CHEST WITH CONTRAST
TECHNIQUE: Multidetector CT imaging of the chest was performed using the
standard protocol during bolus administration of intravenous
contrast. Multiplanar CT image reconstructions and MIPs were
obtained to evaluate the vascular anatomy.
CONTRAST:  100mL OMNIPAQUE IOHEXOL 350 MG/ML SOLN

[Series 8: pe thins · axial · 0.79mm/px · z∈[-295,-86]mm · 17 of 237 slices shown]
[im 14/237  lung]
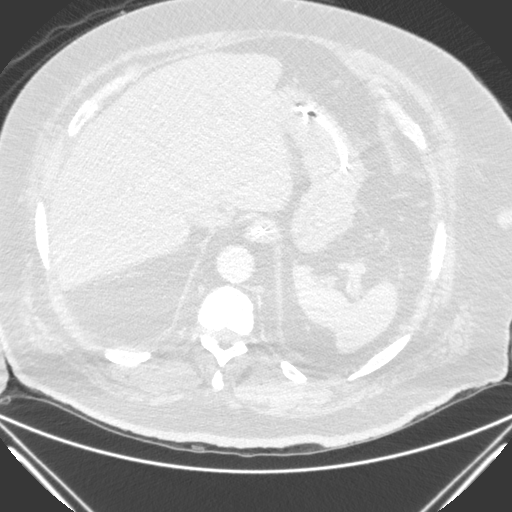
[im 27/237  mediastinal]
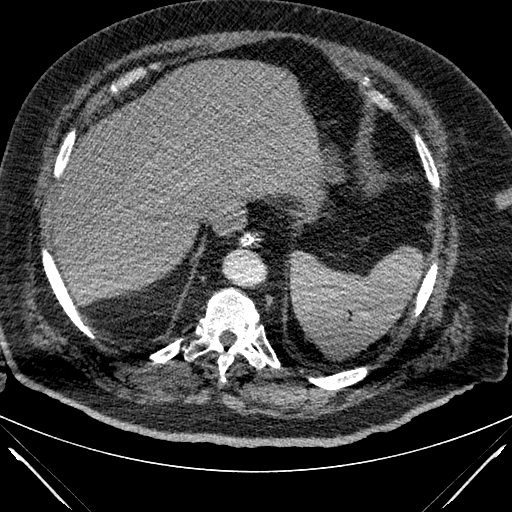
[im 40/237  lung]
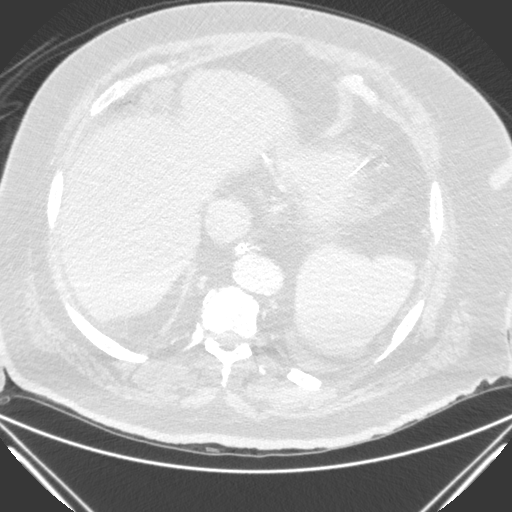
[im 53/237  mediastinal]
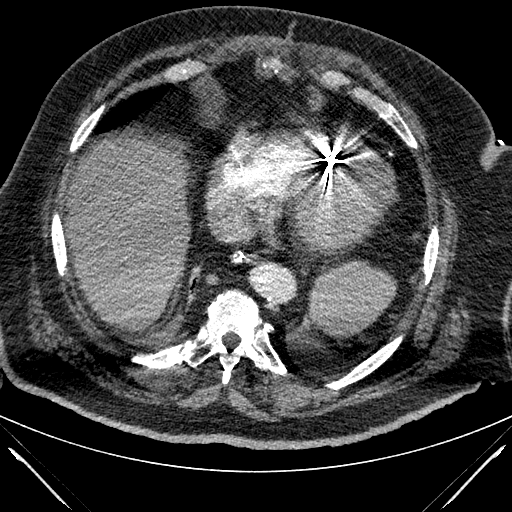
[im 66/237  lung]
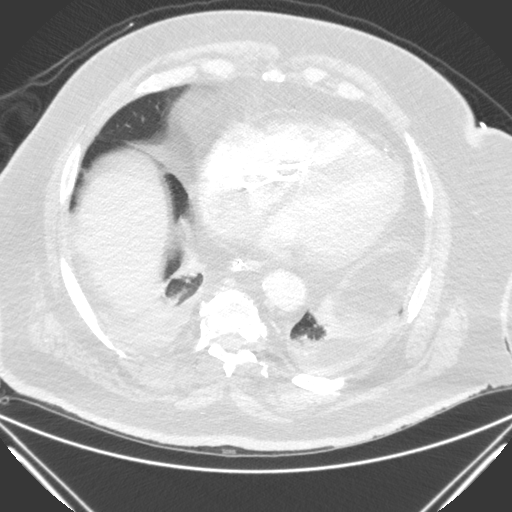
[im 79/237  mediastinal]
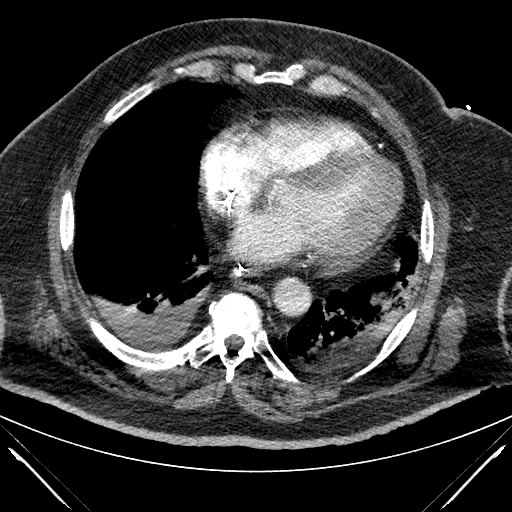
[im 92/237  lung]
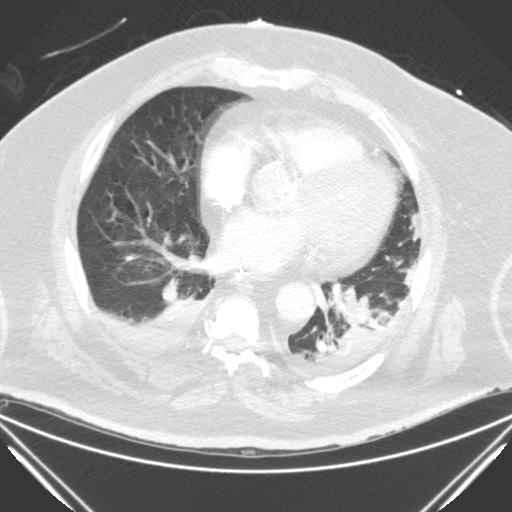
[im 105/237  mediastinal]
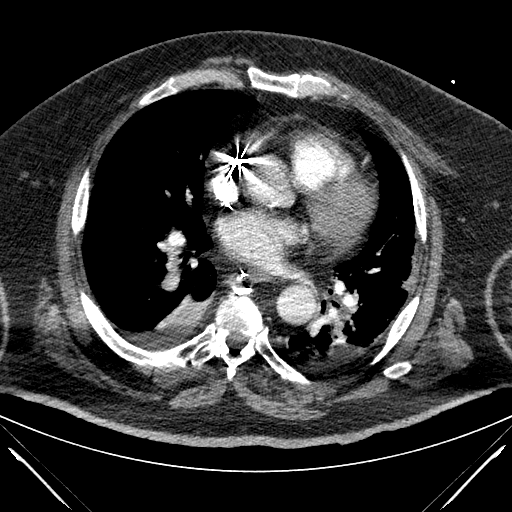
[im 119/237  lung]
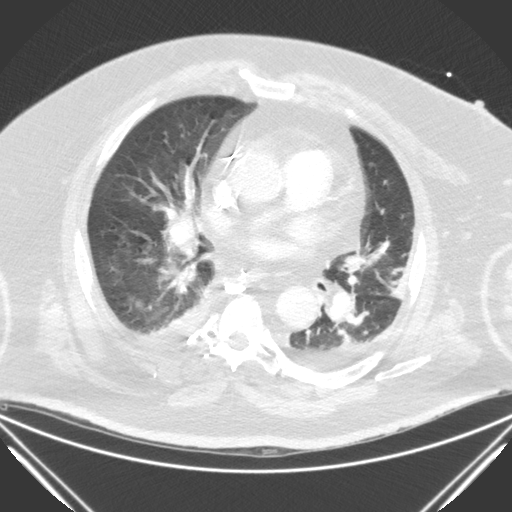
[im 132/237  mediastinal]
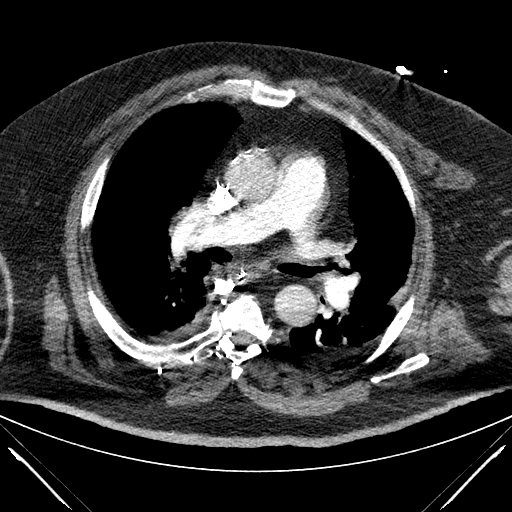
[im 145/237  lung]
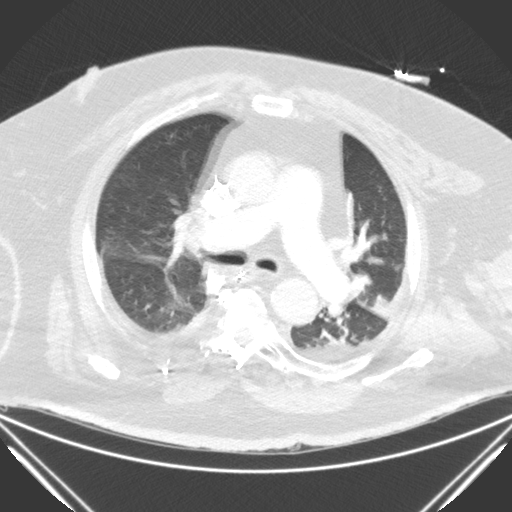
[im 158/237  mediastinal]
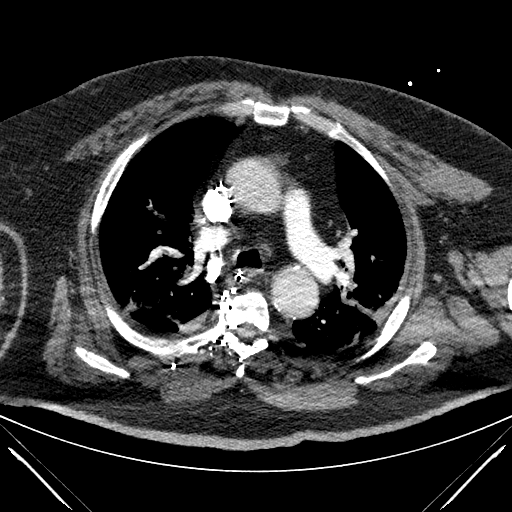
[im 171/237  lung]
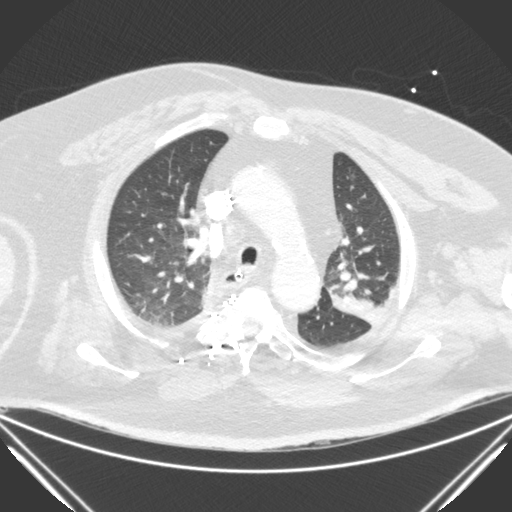
[im 184/237  mediastinal]
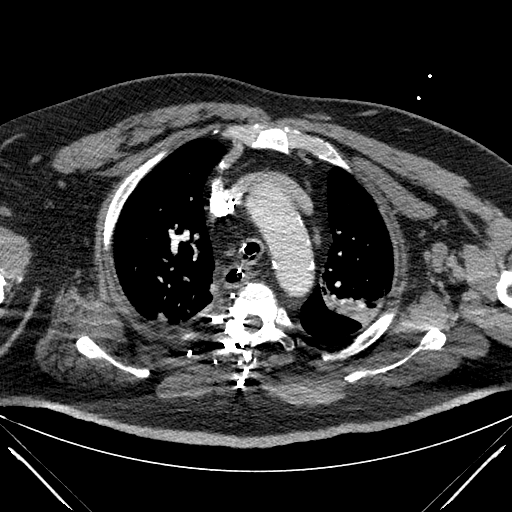
[im 197/237  lung]
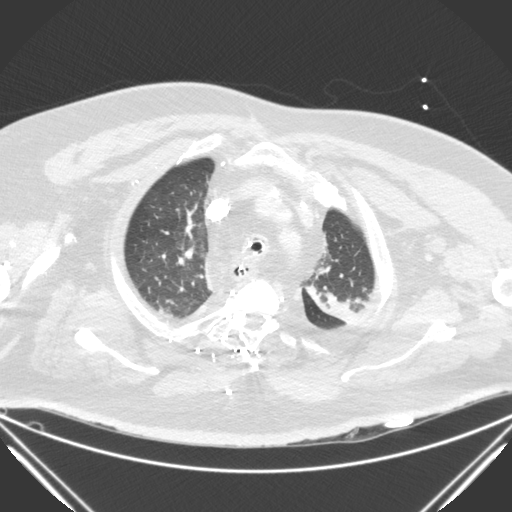
[im 210/237  mediastinal]
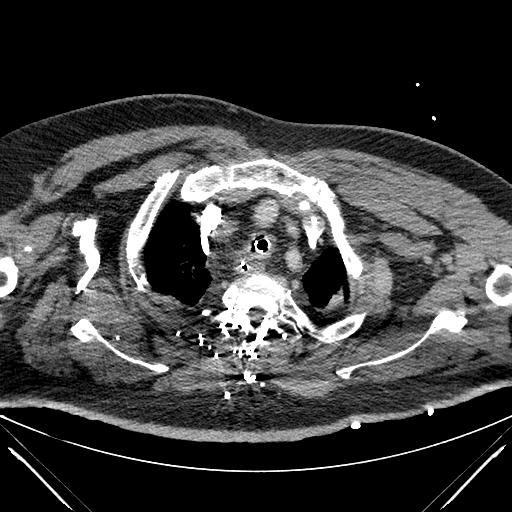
[im 223/237  lung]
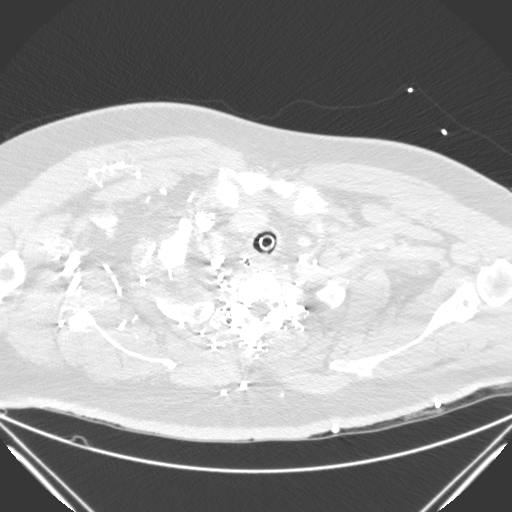

[mpr, coronals, coronal · coronal · 0.79mm/px · 1 of 170 slices shown]
[im 85/170  mediastinal]
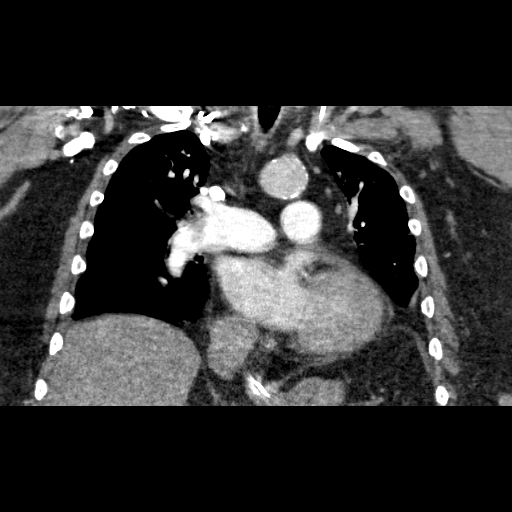

[18 of 36 positions shown; findings below may reference images not displayed]

FINDINGS: Pulmonary retro opacification is suboptimal. There is a significant
filling defect within the right interlobar artery suggesting
pulmonary embolus. There is some filling defect suspected in the
right upper lobe as well on the of this area is degraded by patient
breathing motion.

Scattered areas of airspace consolidation are noted. There is focal
airspace disease in the posterior and lateral left upper lobe.
Dependent airspace disease is present bilaterally. Small bilateral
pleural effusions are noted.

The heart is enlarged. Atherosclerotic calcifications are present in
the coronary artery in the descending aorta.

The bone windows with demonstrate no focal lytic or blastic lesions.
An NG tube is in place. The patient is intubated.

Limited imaging in the upper abdomen is unremarkable.

Review of the MIP images confirms the above findings.
IMPRESSION: 1. Right lower lobar pulmonary embolus.
2. Left upper lobe on bilateral lower lobe airspace disease. While
this likely represents atelectasis, early infection is not excluded.
3. Mild cardiomegaly and atherosclerotic disease.
4. Support apparatus as described.
Critical Value/emergent results were called by telephone at the time
of interpretation on 10/04/2013 at [DATE] to Dr. RENE EDGAR
DERBHLA , who verbally acknowledged these results.

## 2016-01-16 IMAGING — CR DG ABD PORTABLE 1V
1 series · 1 of 1 positions shown · non-contrast
Comparison: 10/04/2013 [DATE] hrs

CLINICAL DATA: Status post gastric catheter placement

EXAM:
PORTABLE ABDOMEN - 1 VIEW

[AP]
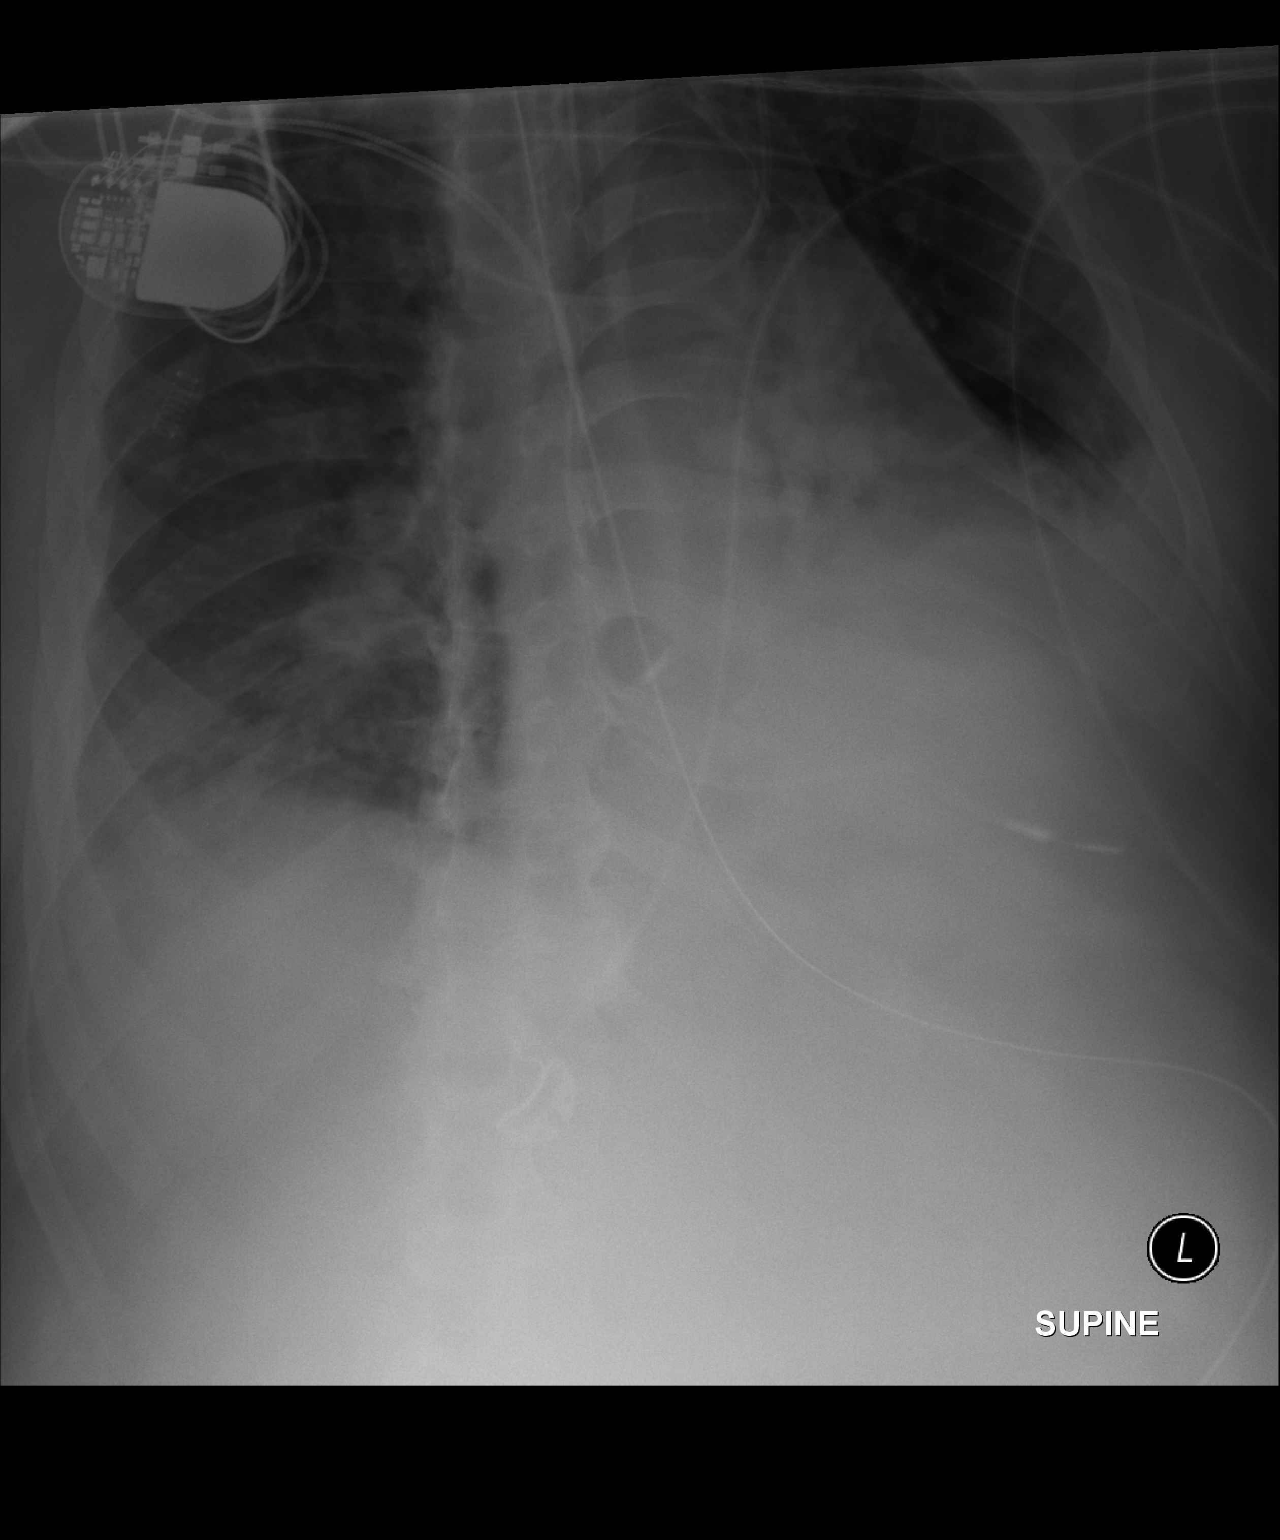

[1 of 1 positions shown; findings below may reference images not displayed]

FINDINGS: A nasogastric catheter is again noted within the stomach. It is
directed towards of pyloric channel. Chronic changes in the left
base are again seen.
IMPRESSION: Gastric catheter within the distal stomach.

These results were called by telephone at the time of interpretation
on 10/04/2013 at [DATE] to Bertrik, the patient's nurse who verbally
acknowledged these results.

## 2016-01-16 IMAGING — DX DG ABD PORTABLE 1V
2 series · 2 of 2 positions shown · non-contrast
Comparison: DG ABD PORTABLE 1V dated 10/02/2013

CLINICAL DATA: Orogastric tube placement.

EXAM:
PORTABLE ABDOMEN - 1 VIEW

[supine ap (1 of 2)]
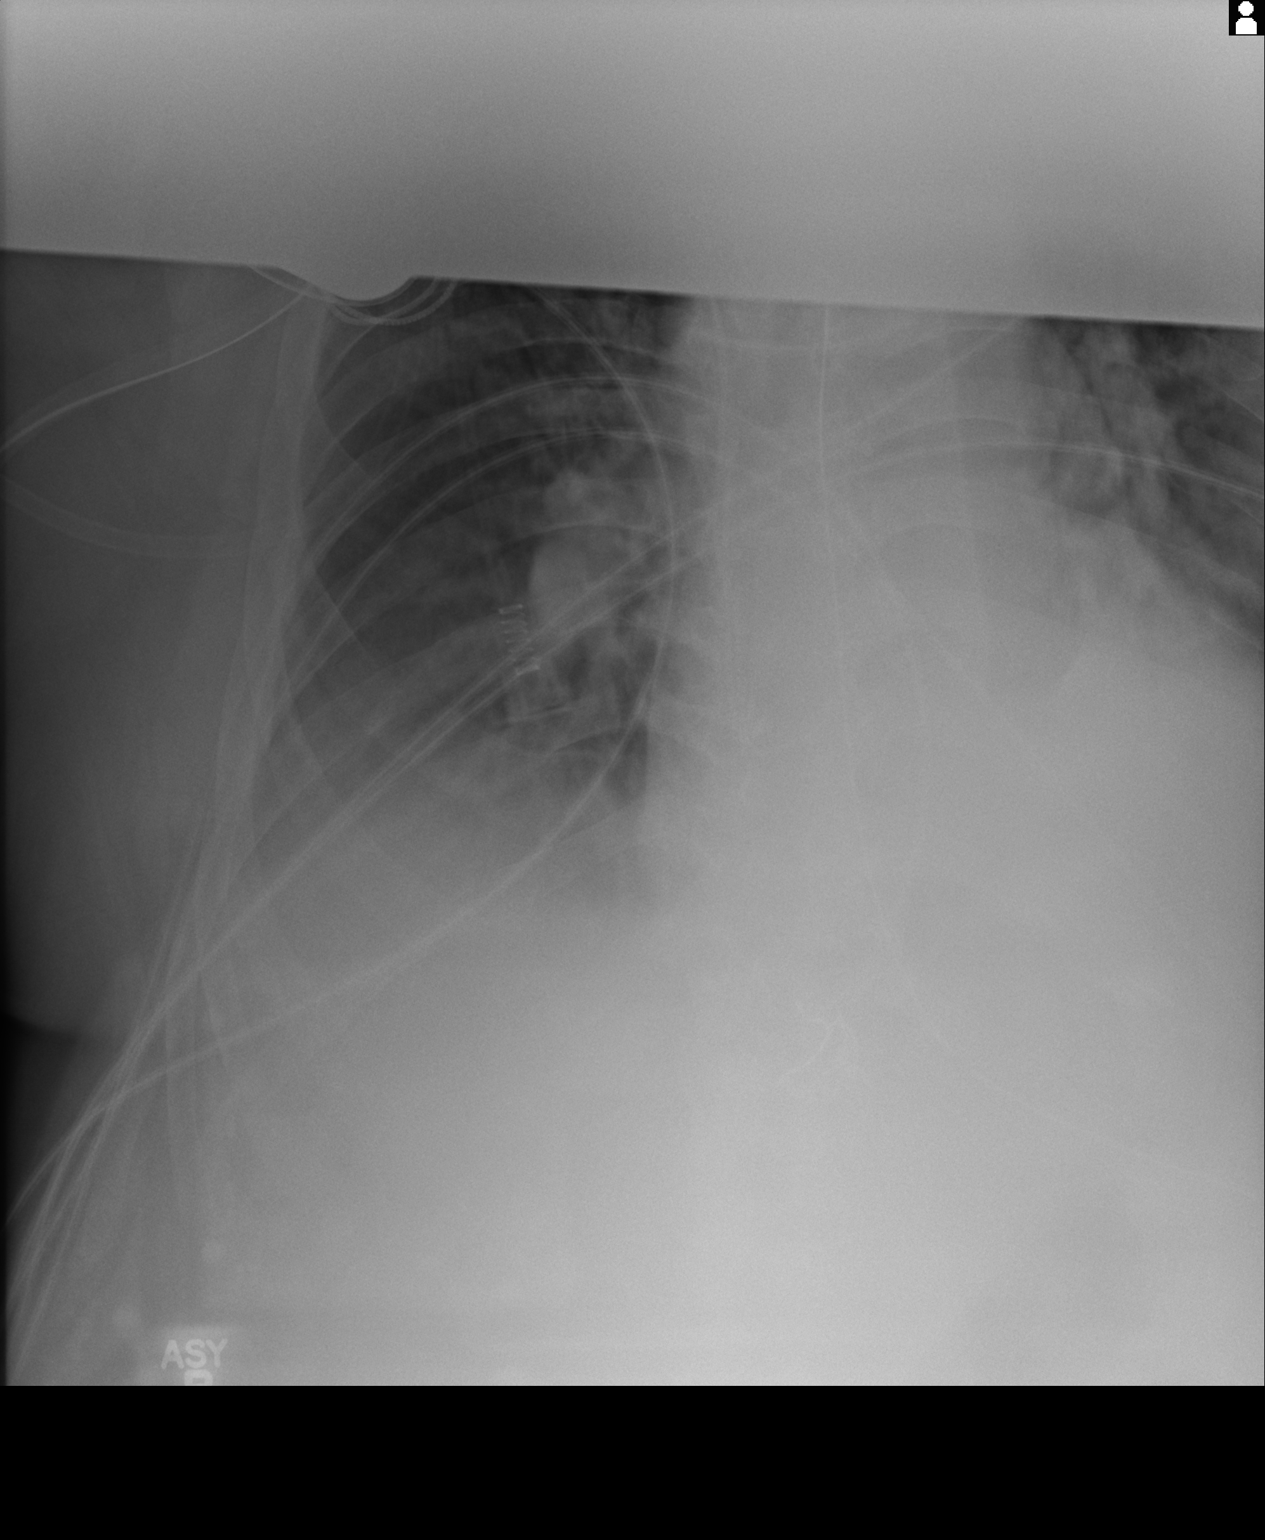

[supine ap (2 of 2)]
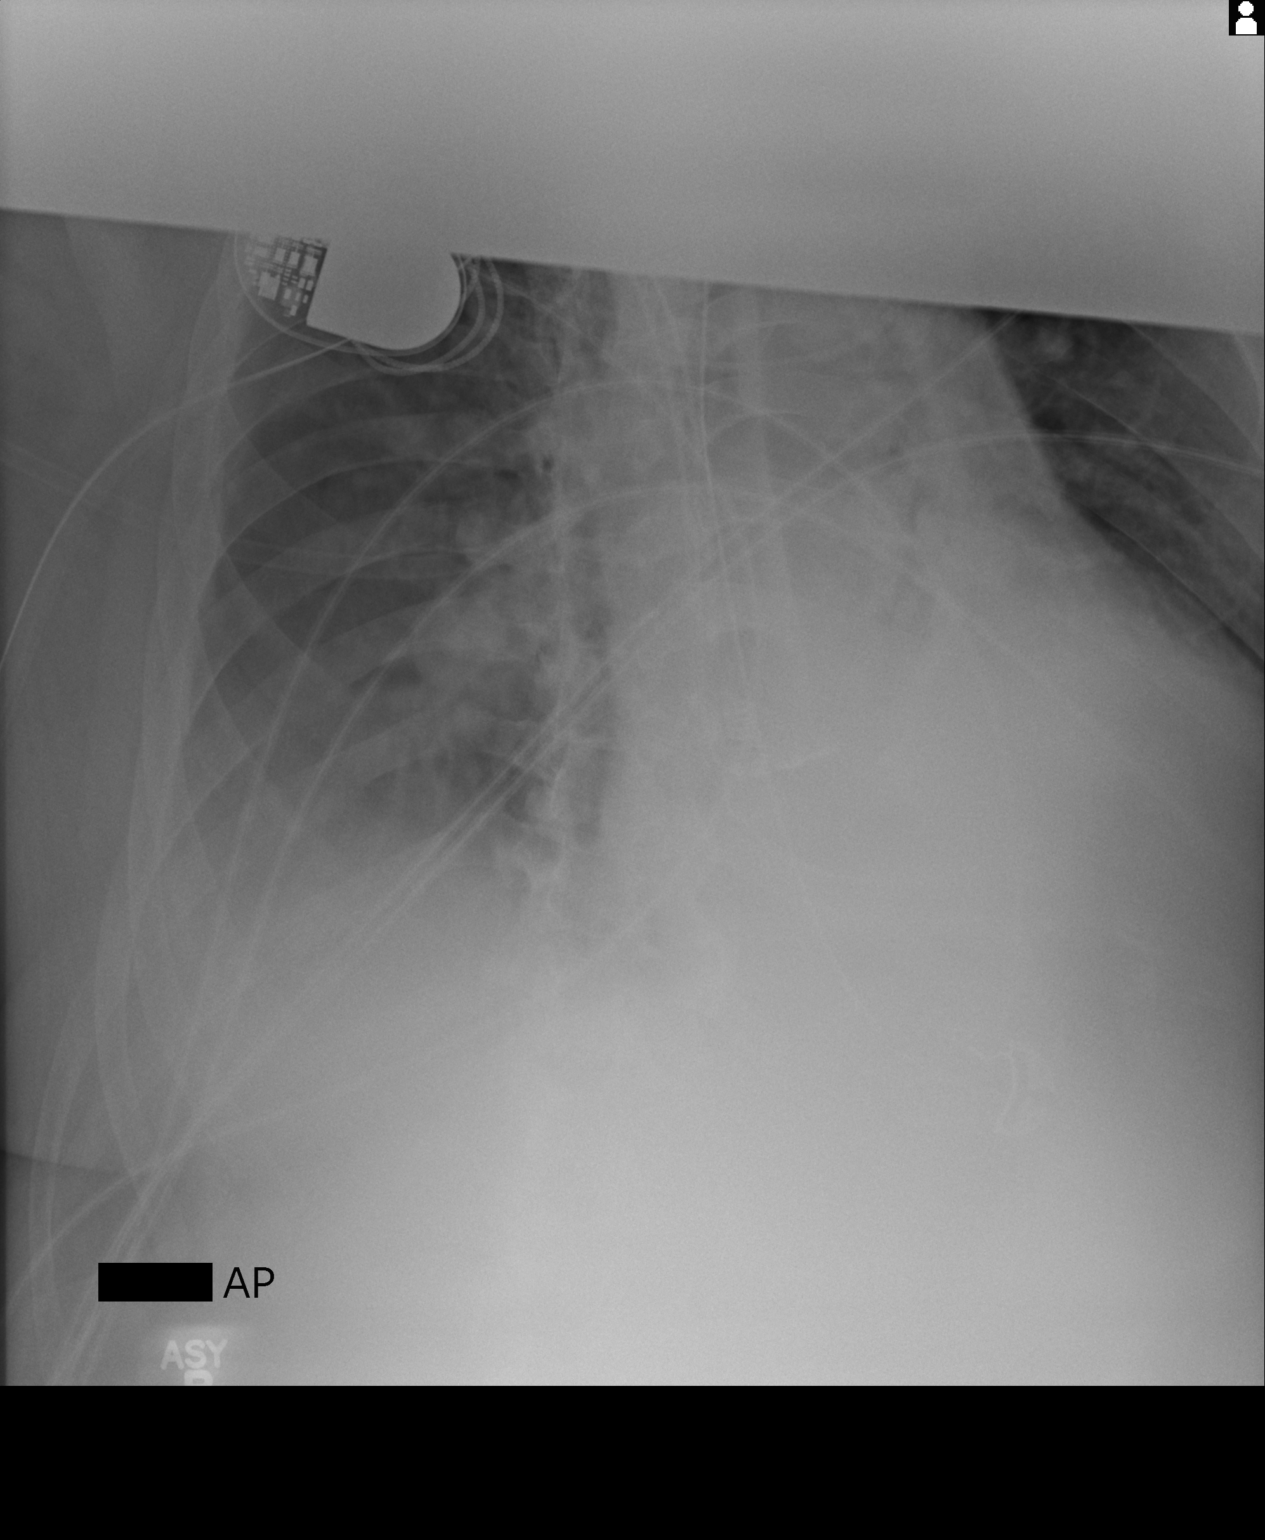

[2 of 2 positions shown; findings below may reference images not displayed]

FINDINGS: Orogastric tube tip noted below the left hemidiaphragm in the upper
portion of the left upper quadrant. Basilar atelectasis.
Cardiomegaly. No free air.
IMPRESSION: 1. An orogastric tube noted with its tip below the left
hemidiaphragm.

2.  Basilar atelectasis.

3. Cardiomegaly .

## 2016-01-17 IMAGING — CR DG CHEST 1V PORT
1 series · 1 of 1 positions shown · non-contrast
Comparison: Chest radiograph October 04, 2013

CLINICAL DATA: Intubation.

EXAM:
PORTABLE CHEST - 1 VIEW

[AP]
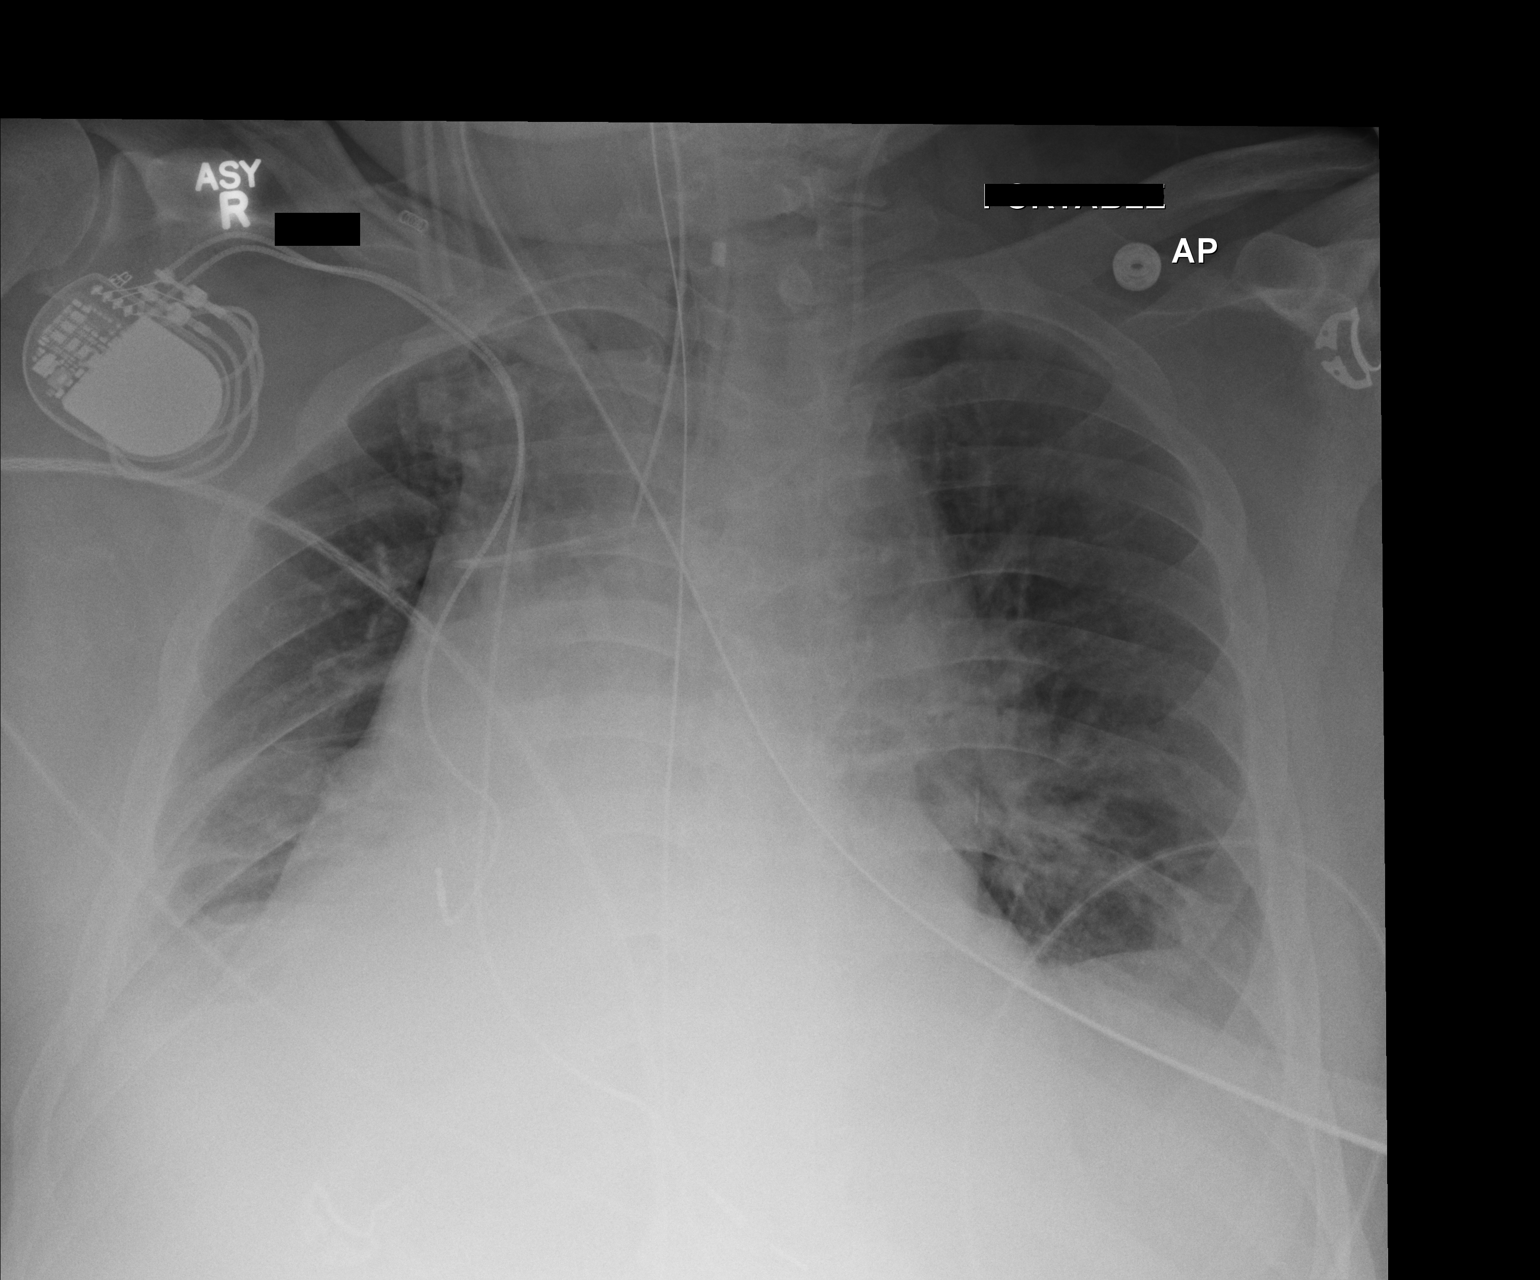

[1 of 1 positions shown; findings below may reference images not displayed]

FINDINGS: Endotracheal tube tip projects 3.3 cm above the equina. Left
internal jugular central venous catheter with distal tip projecting
in proximal superior vena cava. Nasogastric tube past the
gastroesophageal junction though, distal tip is not imaged. Dual
lead right cardiac pacemaker in situ.

Stable at least moderate cardiomegaly, and mediastinal silhouette is
nonsuspicious, patient is rotated to the right. Right lung base
volume loss, the cardiac silhouette is mildly shift of the right
concerning for right middle lobe collapse. Mild patchy bibasilar
airspace opacities with improved aeration of left lung base. Small
pleural effusions. No pneumothorax.

Large body habitus. Osseous structures are nonsuspicious. No
apparent change in position of life support lines.
IMPRESSION: Stable cardiomegaly, bibasilar airspace opacities with improved
aeration of left lung base. Probable right middle lobe collapse.
Small residual pleural effusions.

  By: Loko Mclendon

## 2016-01-17 IMAGING — CT CT HEAD W/O CM
2 series · 16 of 30 positions shown, 18 images · non-contrast
Comparison: CT HEAD W/O CM dated 10/03/2013; CT PORT HEAD WO CM
dated 10/02/2013

CLINICAL DATA: Follow-up stroke.

EXAM:
CT HEAD WITHOUT CONTRAST
TECHNIQUE: Contiguous axial images were obtained from the base of the skull
through the vertex without intravenous contrast.

[Series 2: head w/o · axial · non-contrast · 0.49mm/px · z∈[+80,+200]mm · 8 of 32 slices shown, 10 images]
[im 4/32  brain]
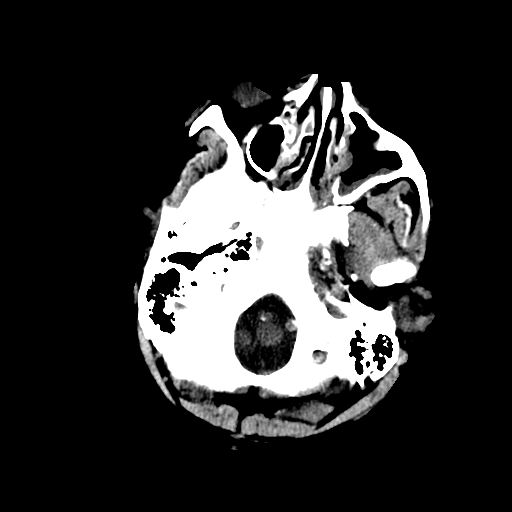
[im 4/32  bone]
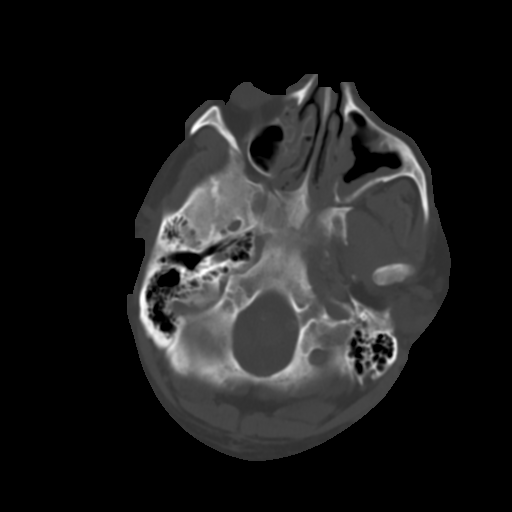
[im 7/32  brain]
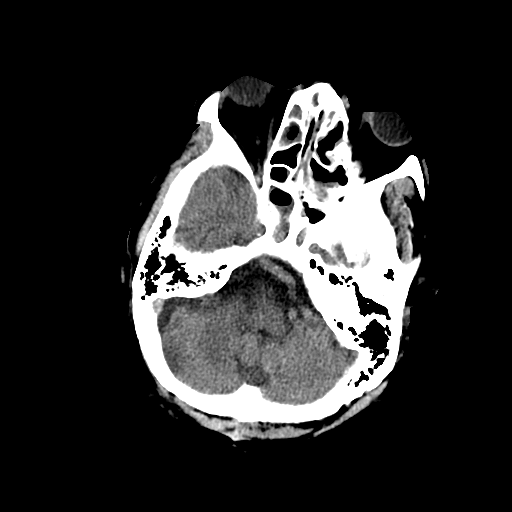
[im 11/32  brain]
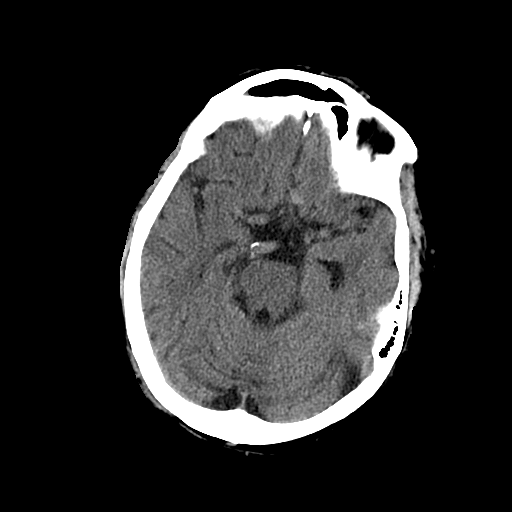
[im 14/32  brain]
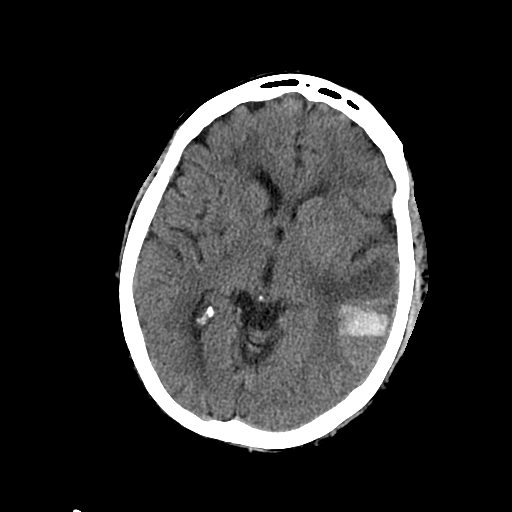
[im 18/32  brain]
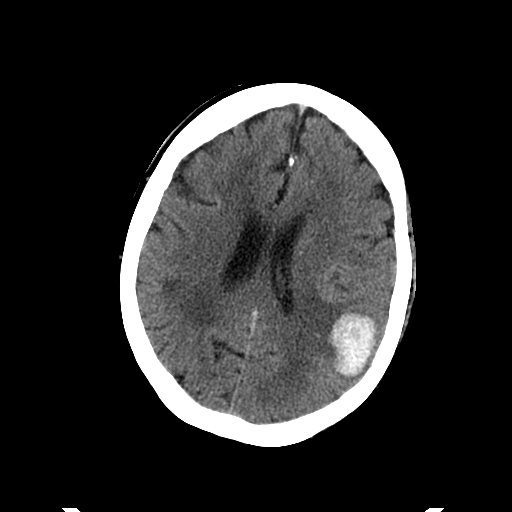
[im 18/32  bone]
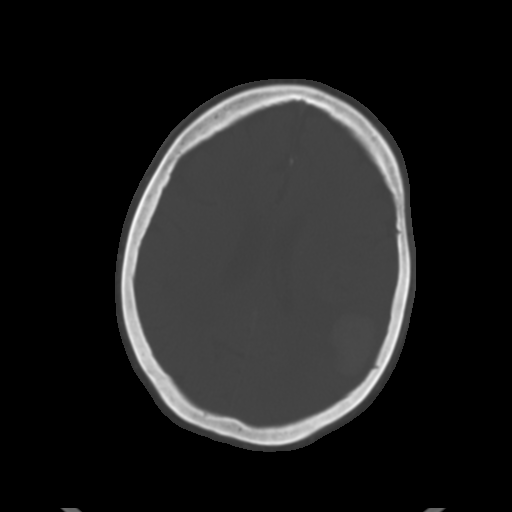
[im 21/32  brain]
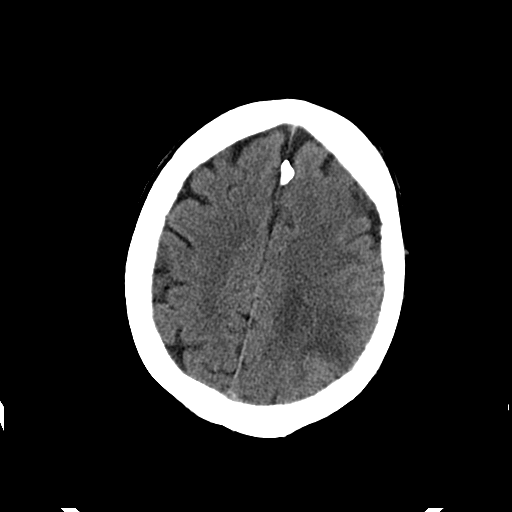
[im 25/32  brain]
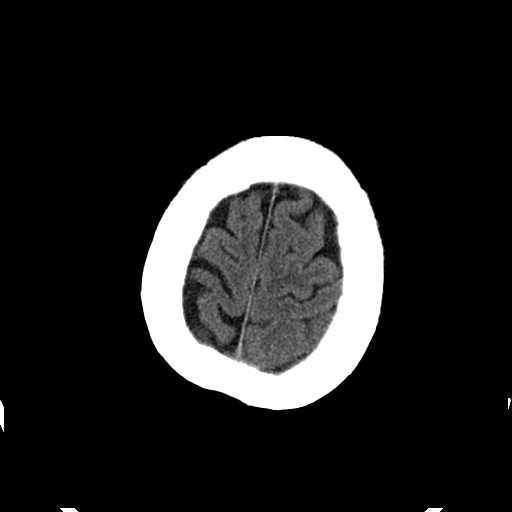
[im 28/32  brain]
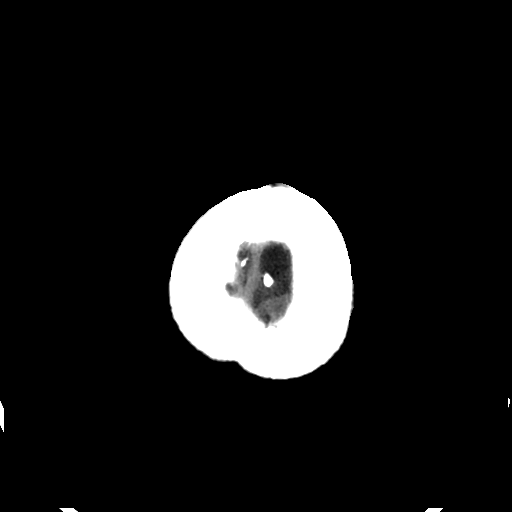

[Series 3: head w/o bone · axial · non-contrast · 0.49mm/px · z∈[+80,+203]mm · 8 of 63 slices shown]
[im 7/63  bone]
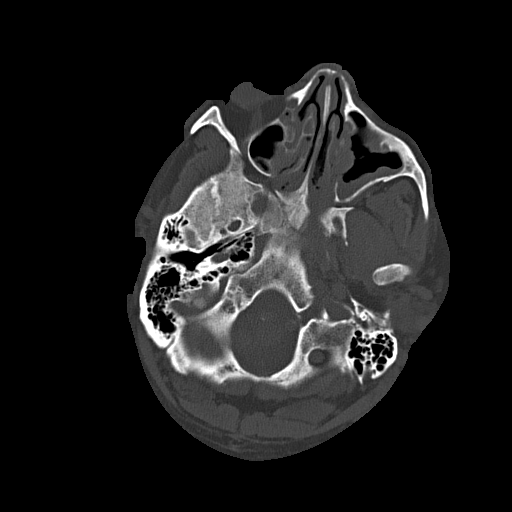
[im 14/63  bone]
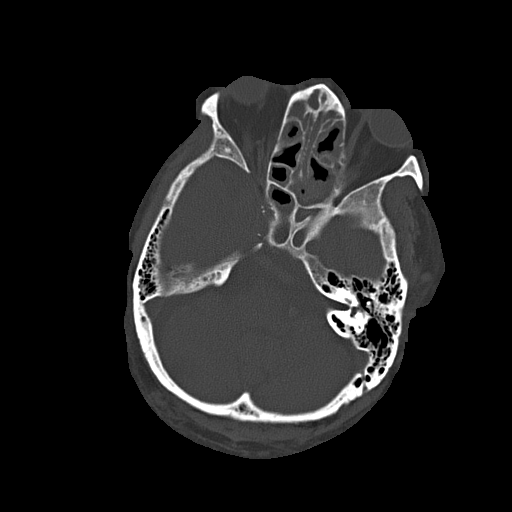
[im 20/63  bone]
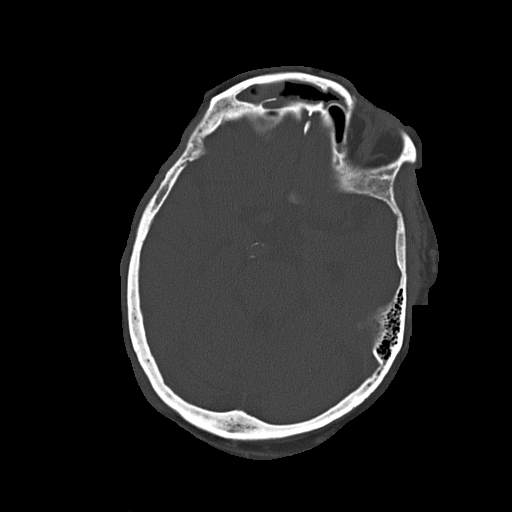
[im 27/63  bone]
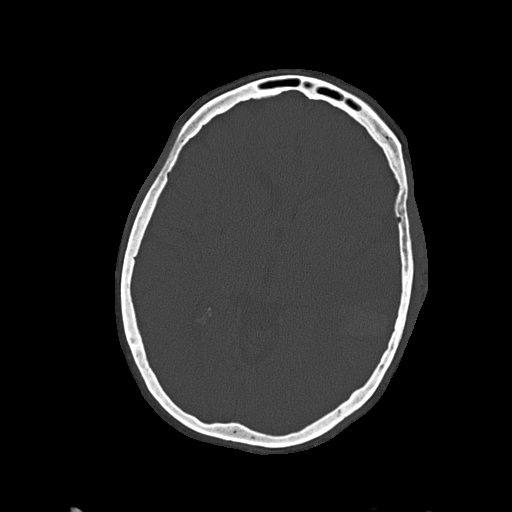
[im 36/63  bone]
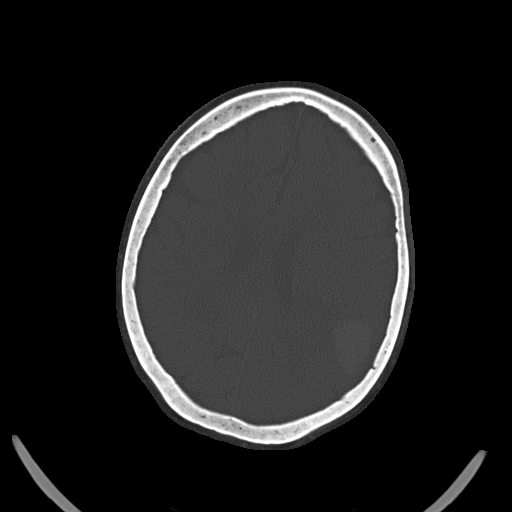
[im 43/63  bone]
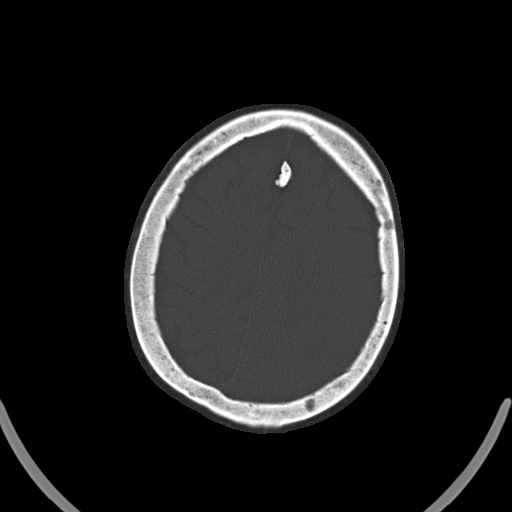
[im 49/63  bone]
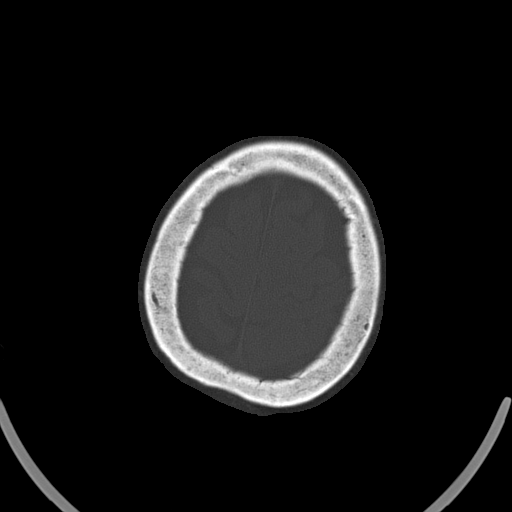
[im 56/63  bone]
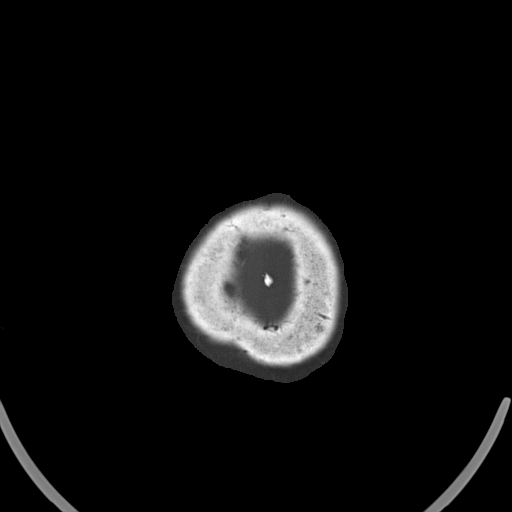

[16 of 30 positions shown; findings below may reference images not displayed]

FINDINGS: Left temporoparietal 2.2 x 3.1 cm intraparenchymal hematoma was
x 3.8 cm. Minimal surrounding low-density vasogenic edema with mild
local mass effect. No rebleed.

The ventricles and sulci are overall normal for patient's age. Mild
disproportionate prominence of the cerebellar folia. Patchy
supratentorial white matter hypodensities exclusive of the vasogenic
edema suggest chronic small vessel ischemic disease, stable. No
acute large vascular territory infarct.

No abnormal extra-axial fluid collections. Basal cisterns are
patent. Dolicoectatic basilar artery suggest chronic hypertension
with moderate calcific atherosclerosis of the intracranial vessels.

Chronic paranasal sinus mucosal thickening, similar. Mastoid air
cells are well aerated. Soft tissue within the external auditory
canal likely reflects cerumen. Suspected proptosis, recommend
clinical correlation.
IMPRESSION: Contracting left temporoparietal intraparenchymal hematoma was 2.7 x
3.8 cm, now 2.2 x 3.1 cm without rebleed.

  By: Ginna Tiger

## 2016-01-19 IMAGING — CR DG CHEST 1V PORT
1 series · 1 of 1 positions shown · non-contrast
Comparison: 10/06/2013

CLINICAL DATA: Hemorrhagic cerebral infarct. Acute respiratory
failure.

EXAM:
PORTABLE CHEST - 1 VIEW

[AP]
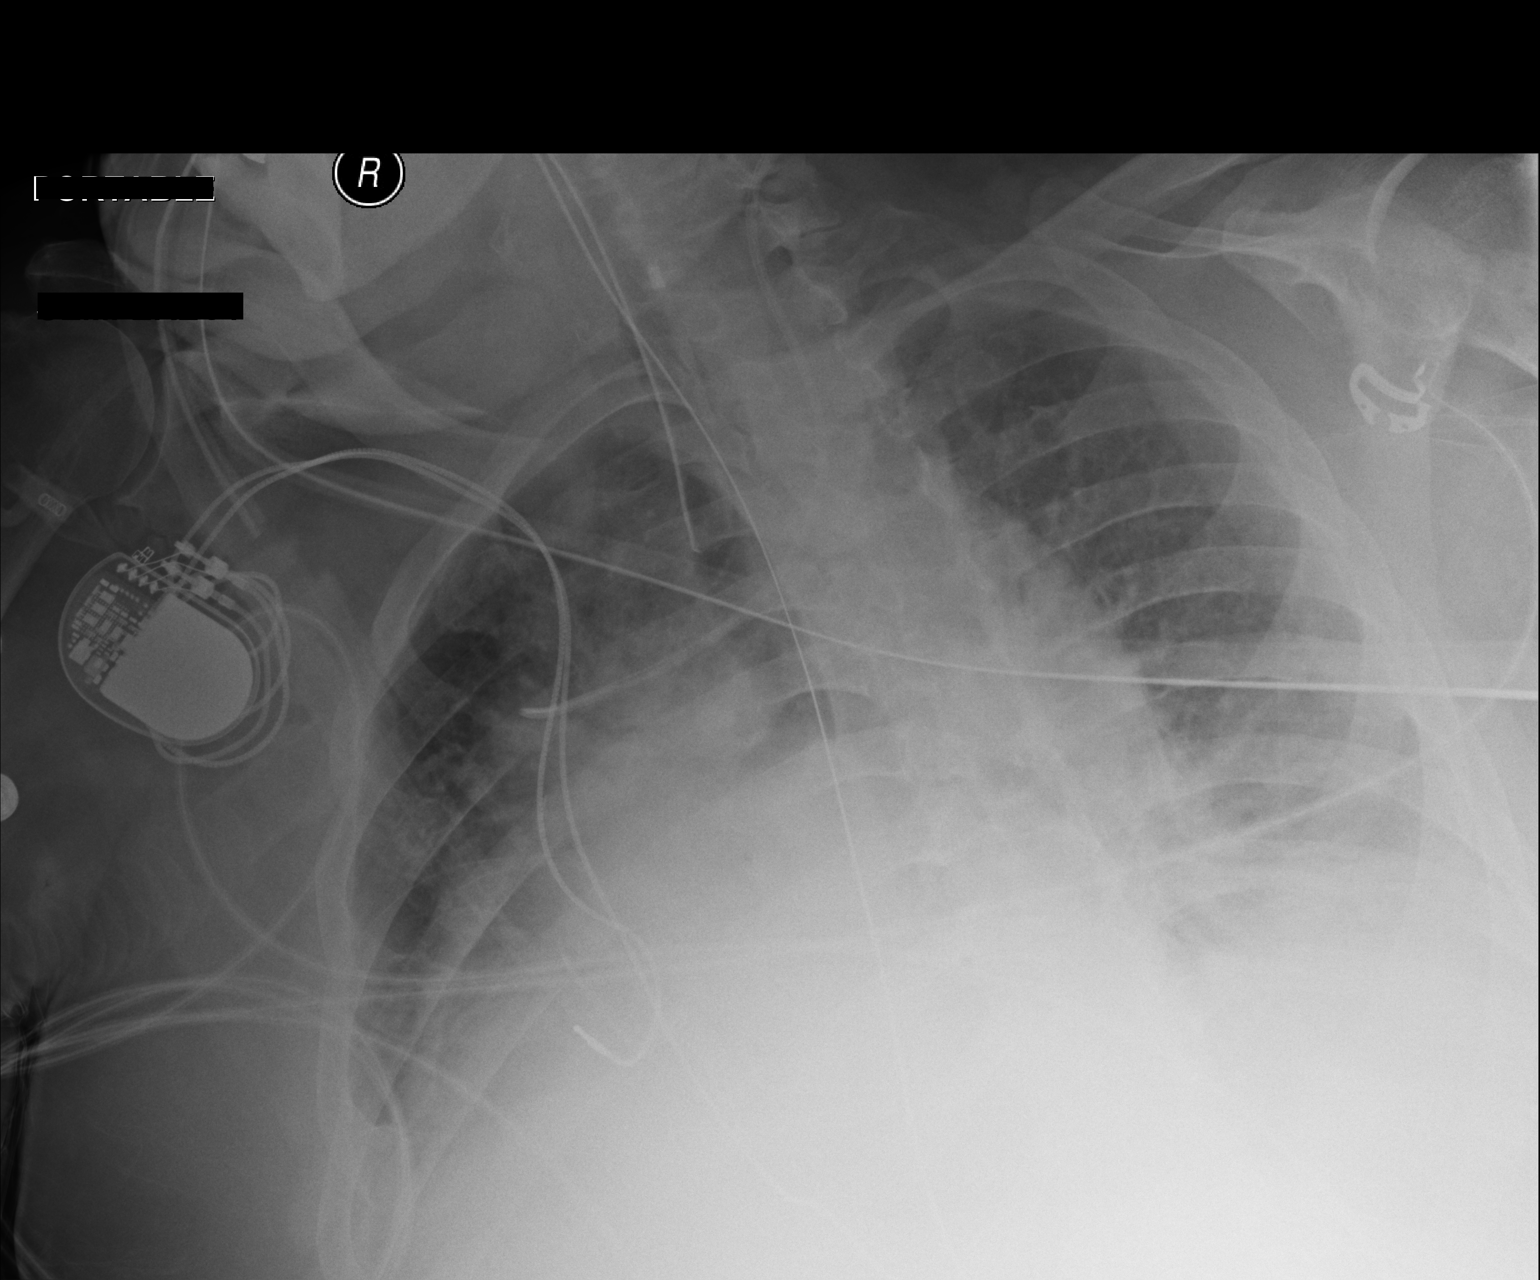

[1 of 1 positions shown; findings below may reference images not displayed]

FINDINGS: Patient is rotated to the right. Low lung volumes again noted. There
is persistent opacity seen in the right lung base, which could
represent combination of atelectasis, infiltrate, or effusion. Left
lung remains grossly clear. The heart size is stable allowing for
positioning.
IMPRESSION: Suboptimal exam due to positioning. Persistent low lung volumes and
right lower lung opacity.

## 2016-01-20 ENCOUNTER — Encounter: Payer: Self-pay | Admitting: Internal Medicine

## 2016-01-20 IMAGING — CT CT HEAD W/O CM
1 series · 15 of 30 positions shown, 19 images · non-contrast
Comparison: CT HEAD W/O CM dated 10/05/2013; CT HEAD W/O CM dated
10/03/2013

CLINICAL DATA: Follow-up stroke.

EXAM:
CT HEAD WITHOUT CONTRAST
TECHNIQUE: Contiguous axial images were obtained from the base of the skull
through the vertex without intravenous contrast.

[Series 2: head 5.0 h30s · axial · 0.42mm/px · z∈[-145,-10]mm · 15 of 30 slices shown, 19 images]
[im 2/30  brain]
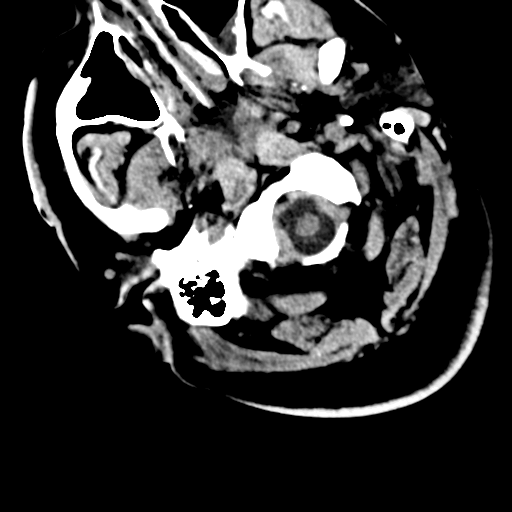
[im 2/30  bone]
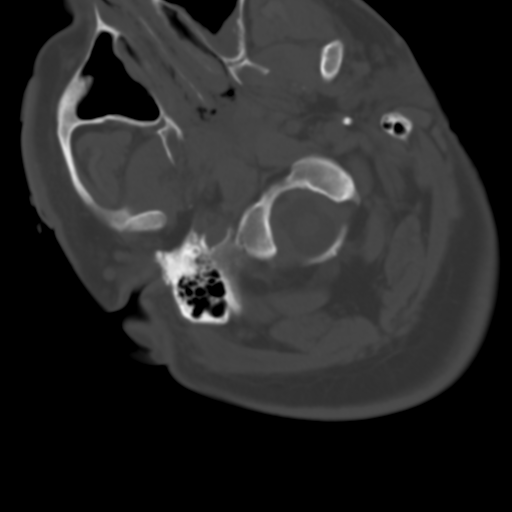
[im 4/30  brain]
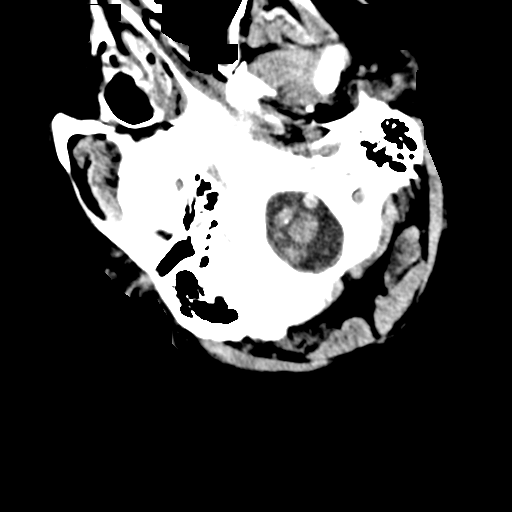
[im 6/30  brain]
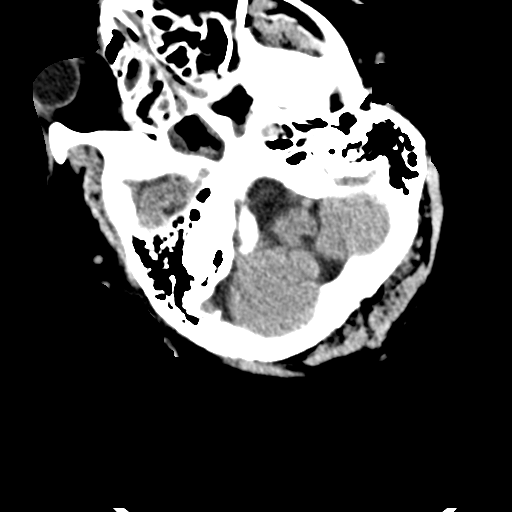
[im 8/30  brain]
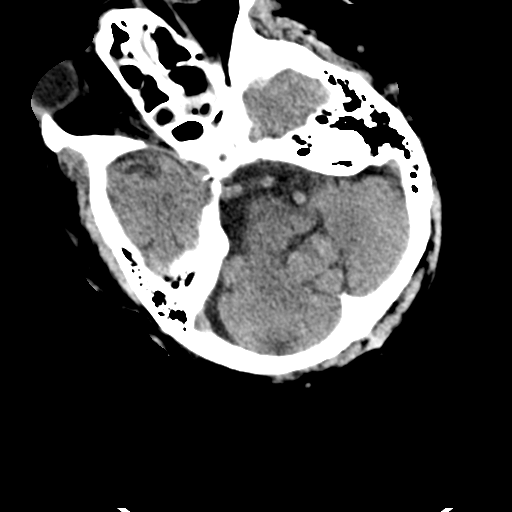
[im 10/30  brain]
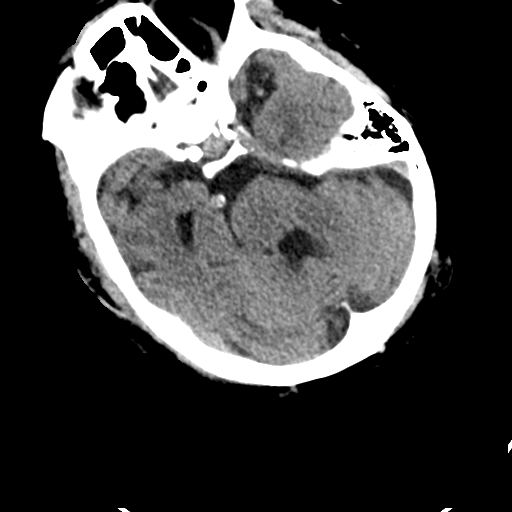
[im 10/30  bone]
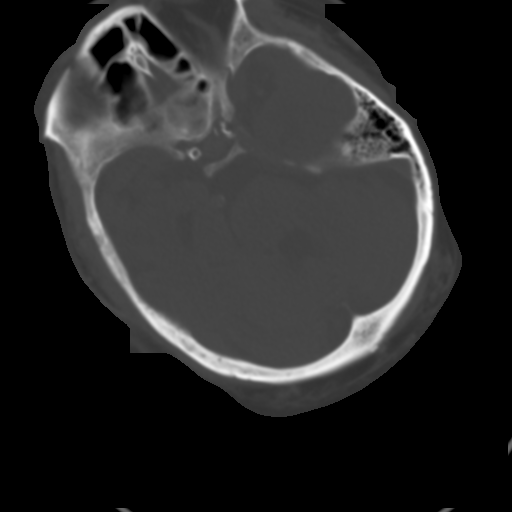
[im 12/30  brain]
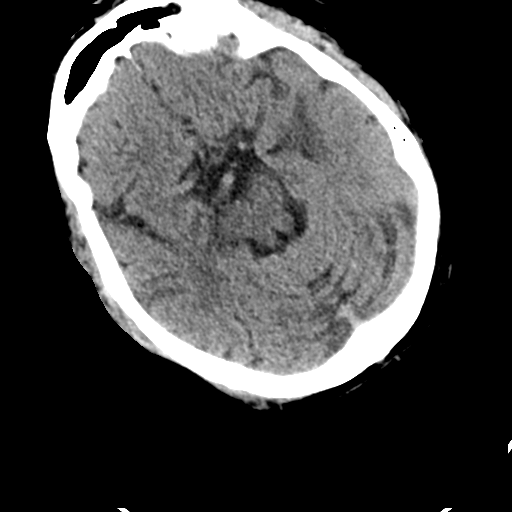
[im 14/30  brain]
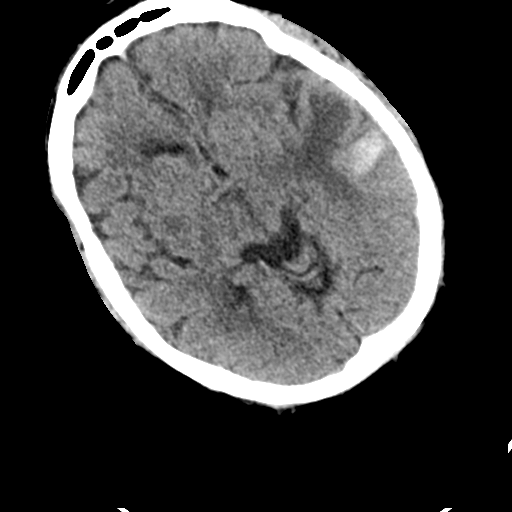
[im 16/30  brain]
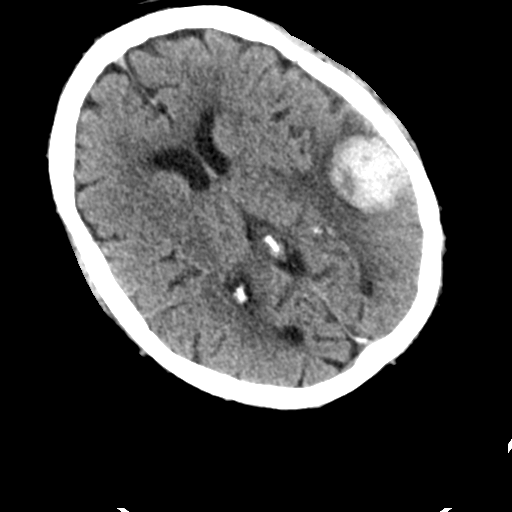
[im 17/30  brain]
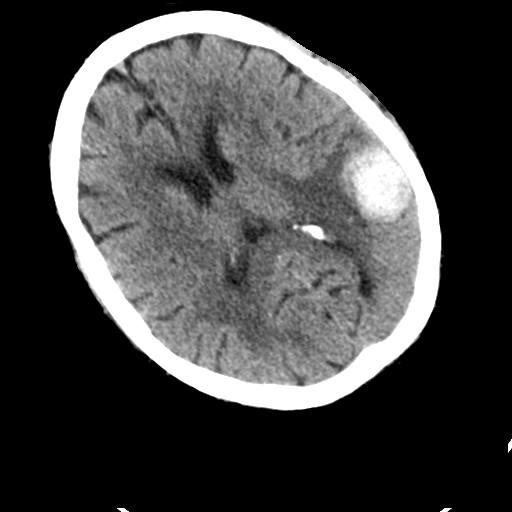
[im 17/30  bone]
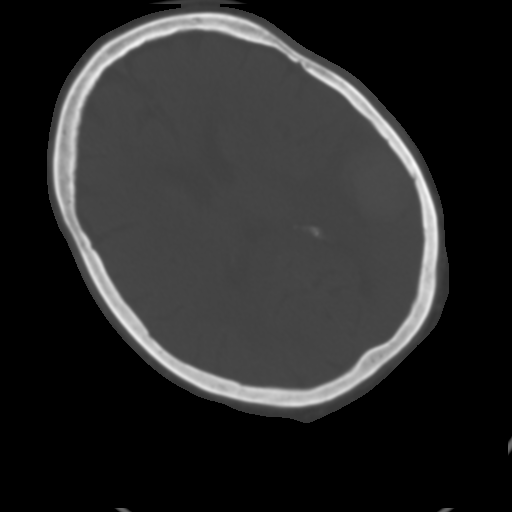
[im 19/30  brain]
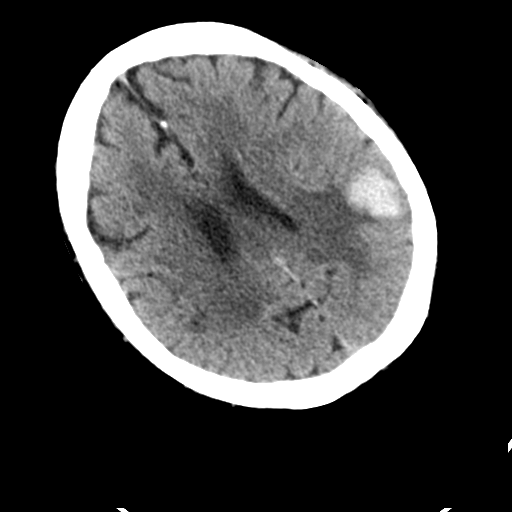
[im 21/30  brain]
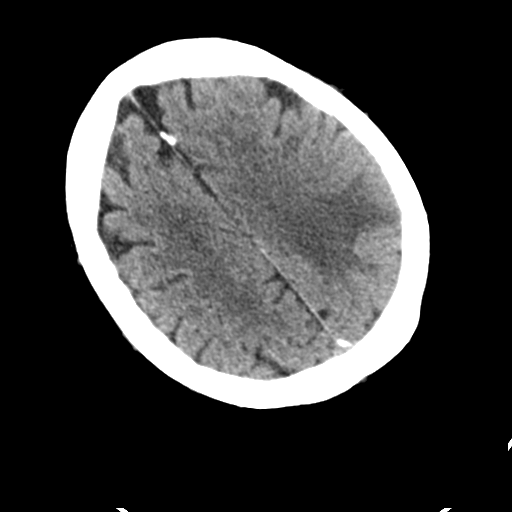
[im 23/30  brain]
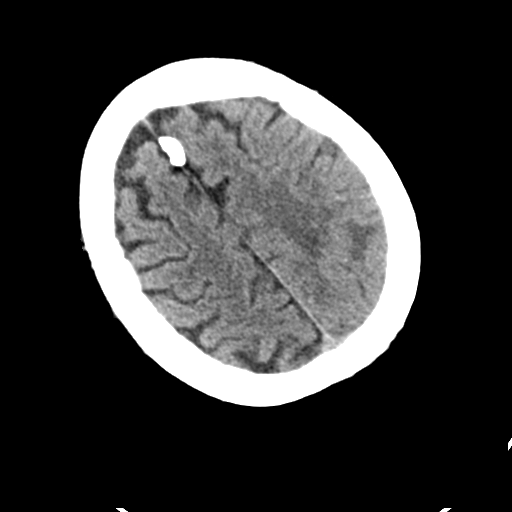
[im 25/30  brain]
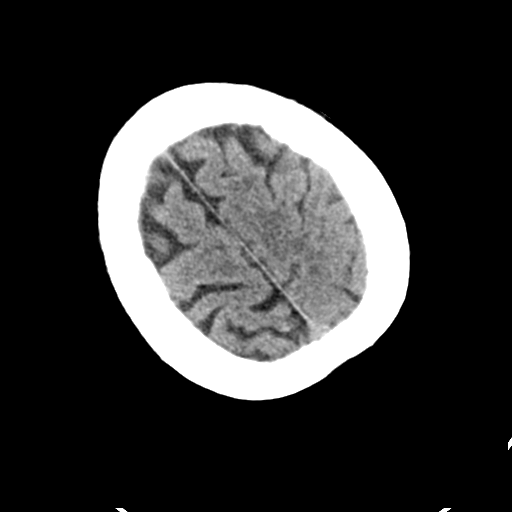
[im 25/30  bone]
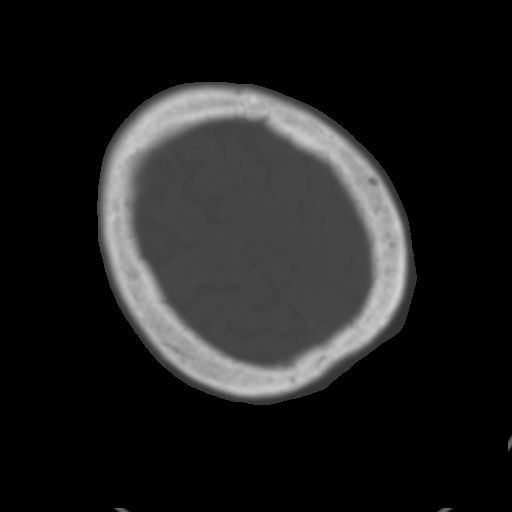
[im 27/30  brain]
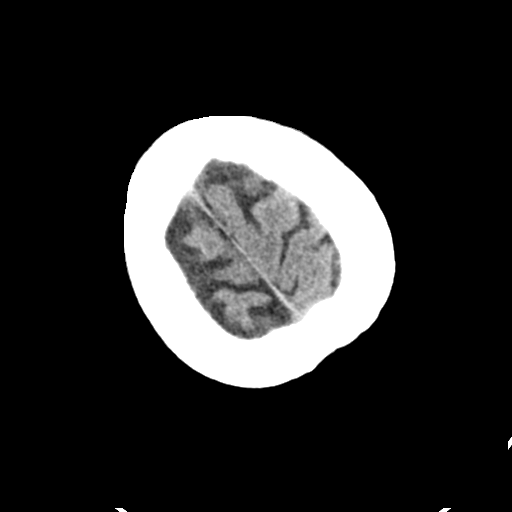
[im 29/30  brain]
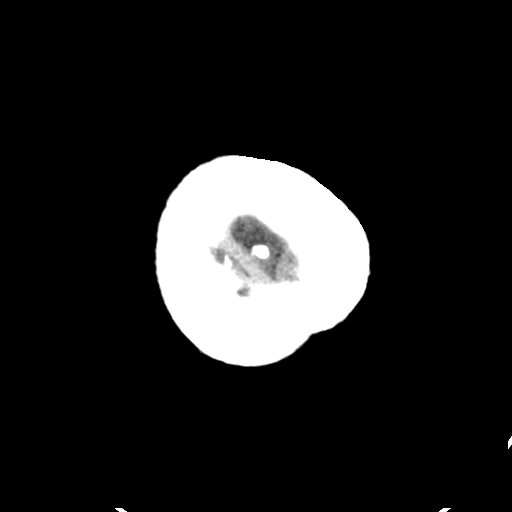

[15 of 30 positions shown; findings below may reference images not displayed]

FINDINGS: Left temporoparietal intraparenchymal hematoma was 2.2 x 3.1 cm, now
2.9 x 3.5 cm, concerning for rebleed. Surrounding low-density
vasogenic edema with sulcal effacement. No midline shift.

The ventricles and sulci are overall normal for patient's age. Mild
disproportionate prominence of the cerebellar folia again noted.
Patchy supratentorial white matter hypodensities exclusive of the
vasogenic edema suggest chronic small vessel ischemic disease. No
acute large vascular territory infarct.

No abnormal extra-axial fluid collections. Basal cisterns are
patent. Dolichoectasia basilar artery suggest chronic hypertension
with moderate calcific atherosclerosis of the carotid siphons.

Chronic paranasal sinusitis. Mastoid air cells demonstrate minimal
soft tissue within the left mastoid tip. Suspected proptosis,
recommend direct inspection. Soft tissue within the external
auditory canals likely reflects cerumen.
IMPRESSION: Interval increase in size of left temporoparietal intraparenchymal
hematoma, was 2.2 x 3.1 cm, now 2.9 x 3.5 cm concerning for rebleed/
propagation of hemorrhagic stroke. Local mass effect without midline
shift. No acute large vascular territory. These results will be
called to the ordering clinician or representative by the
Radiologist Assistant, and communication documented in the PACS
Dashboard.

  By: Ingunn Harpa Ronlor

## 2016-01-20 IMAGING — CR DG ABD PORTABLE 1V
1 series · 1 of 1 positions shown · non-contrast
Comparison: [DATE]

CLINICAL DATA: OG tube placement.

EXAM:
PORTABLE ABDOMEN - 1 VIEW

[AP]
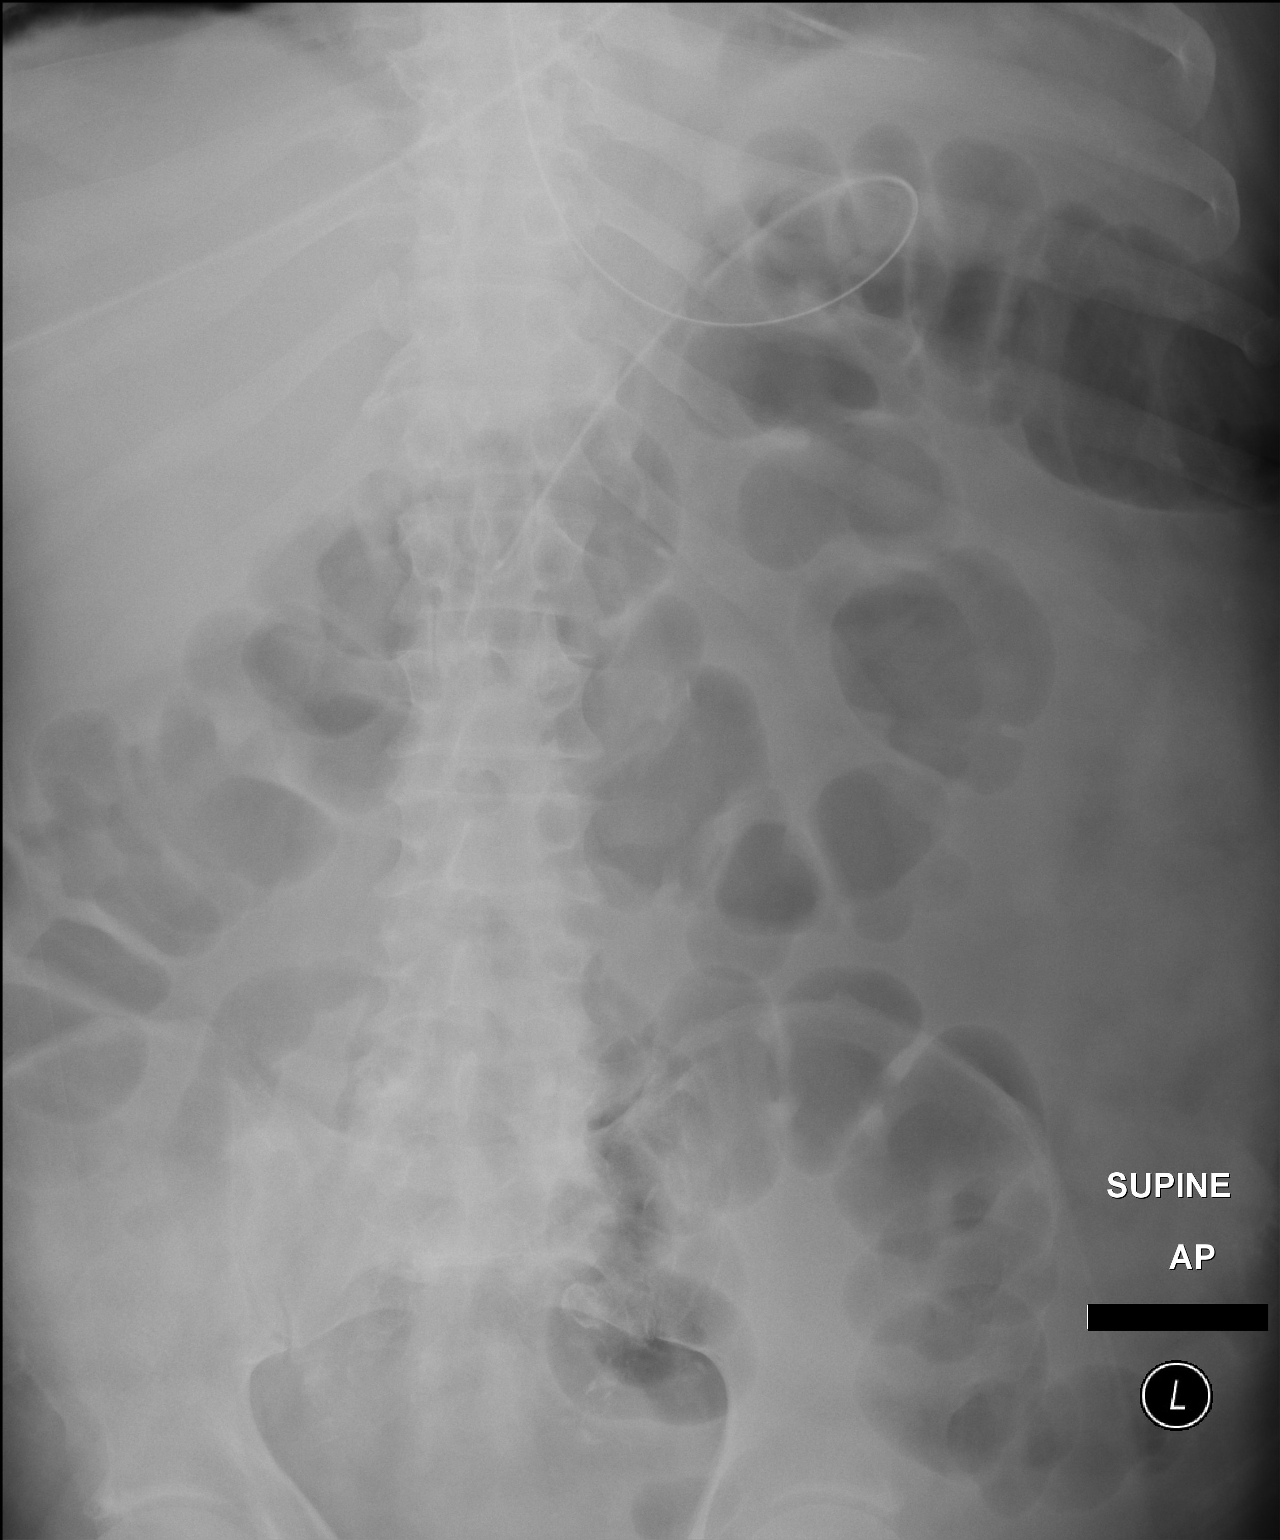

[1 of 1 positions shown; findings below may reference images not displayed]

FINDINGS: Orogastric tube has been placed, tip overlying the level of the mid
to distal stomach. Bowel gas pattern is nonobstructive.
IMPRESSION: Interval repositioning of orogastric tube.

## 2016-01-21 IMAGING — CR DG CHEST 1V PORT
1 series · 1 of 1 positions shown · non-contrast
Comparison: Single view of the chest 10/07/2013 and 10/06/2013.

CLINICAL DATA: Stroke.  Acute respiratory failure.

EXAM:
PORTABLE CHEST - 1 VIEW

[AP]
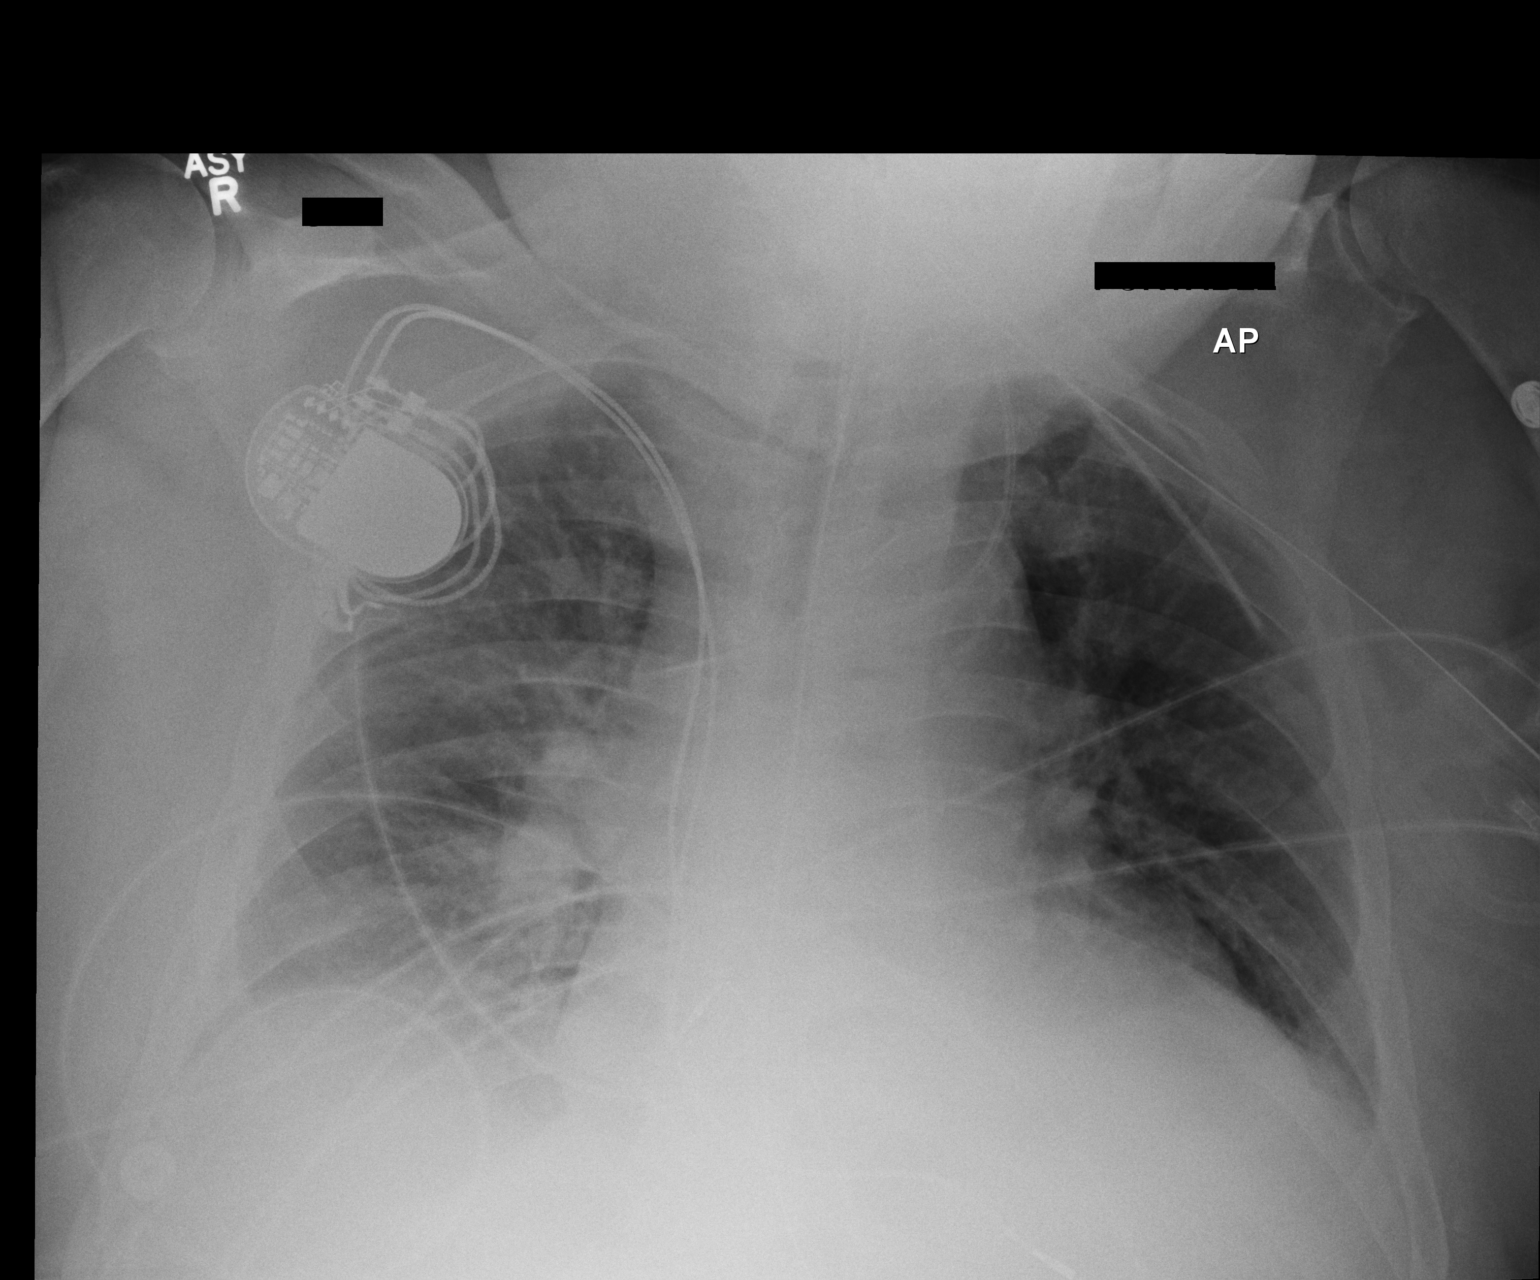

[1 of 1 positions shown; findings below may reference images not displayed]

FINDINGS: The patient's left IJ catheter has backed out slightly with the tip
now just in the superior vena cava. Support tubes and lines are
otherwise unchanged. Bibasilar atelectasis is again seen. There is
cardiomegaly but no edema.
IMPRESSION: Cardiomegaly without edema.  No change in bibasilar atelectasis.

Left IJ catheter has backed out slightly with the tip now just in
the superior vena cava.

## 2016-01-22 IMAGING — CR DG CHEST 1V PORT
2 series · 2 of 2 positions shown · non-contrast
Comparison: DG CHEST 1V PORT dated 10/09/2013; DG CHEST 1V PORT
dated 10/07/2013

CLINICAL DATA: Intubation.

EXAM:
PORTABLE CHEST - 1 VIEW

[AP (1 of 2)]
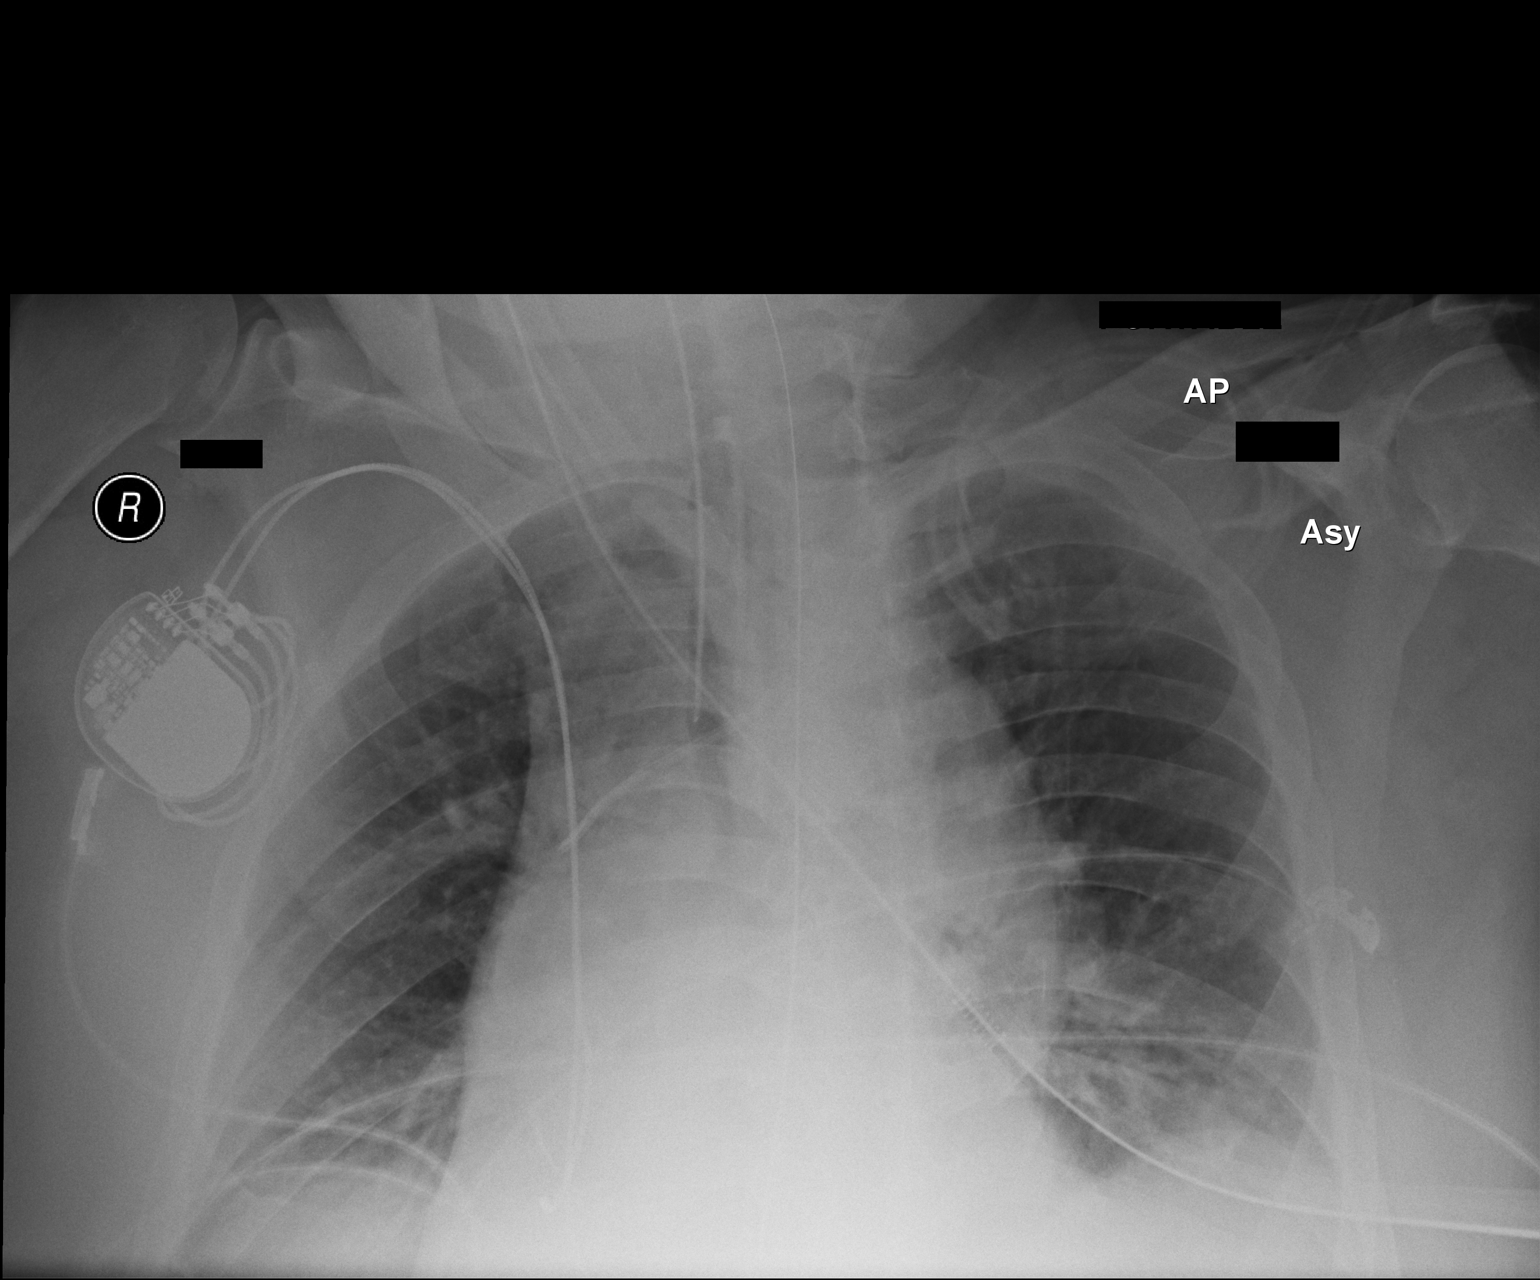

[AP (2 of 2)]
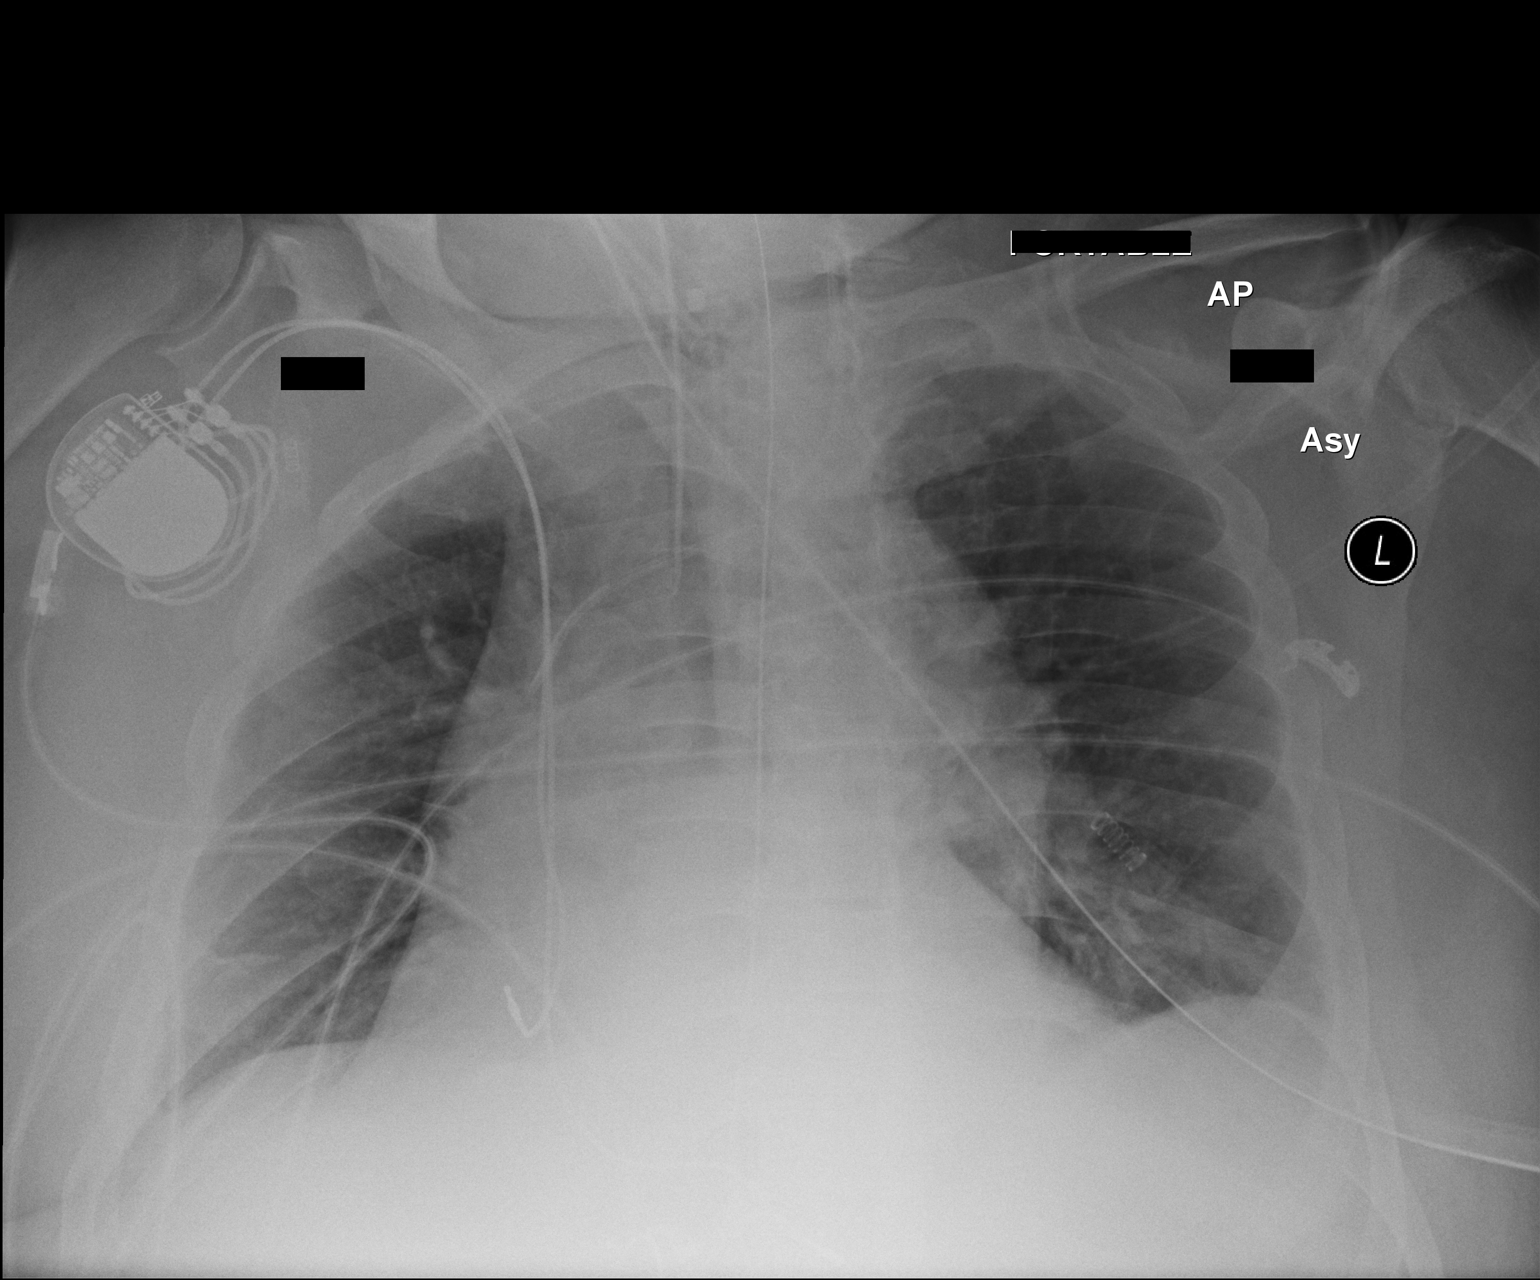

[2 of 2 positions shown; findings below may reference images not displayed]

FINDINGS: Endotracheal tube and NG tube in stable position. Left IJ line in
stable position. Stable cardiomegaly. Cardiomegaly is severe.Cardiac
pacer is in stable position. Mild clearing of bibasilar atelectasis
and/or infiltrates. No pleural effusion or pneumothorax.
IMPRESSION: 1. Line and tube position stable.
2. Severe stable cardiomegaly.
3. Slight clearing of mild bibasilar atelectasis and/or infiltrates.

## 2016-01-23 IMAGING — CR DG CHEST 1V PORT
1 series · 1 of 1 positions shown · non-contrast
Comparison: DG CHEST 1V PORT dated 10/10/2013

CLINICAL DATA: Intubation .

EXAM:
PORTABLE CHEST - 1 VIEW

[AP]
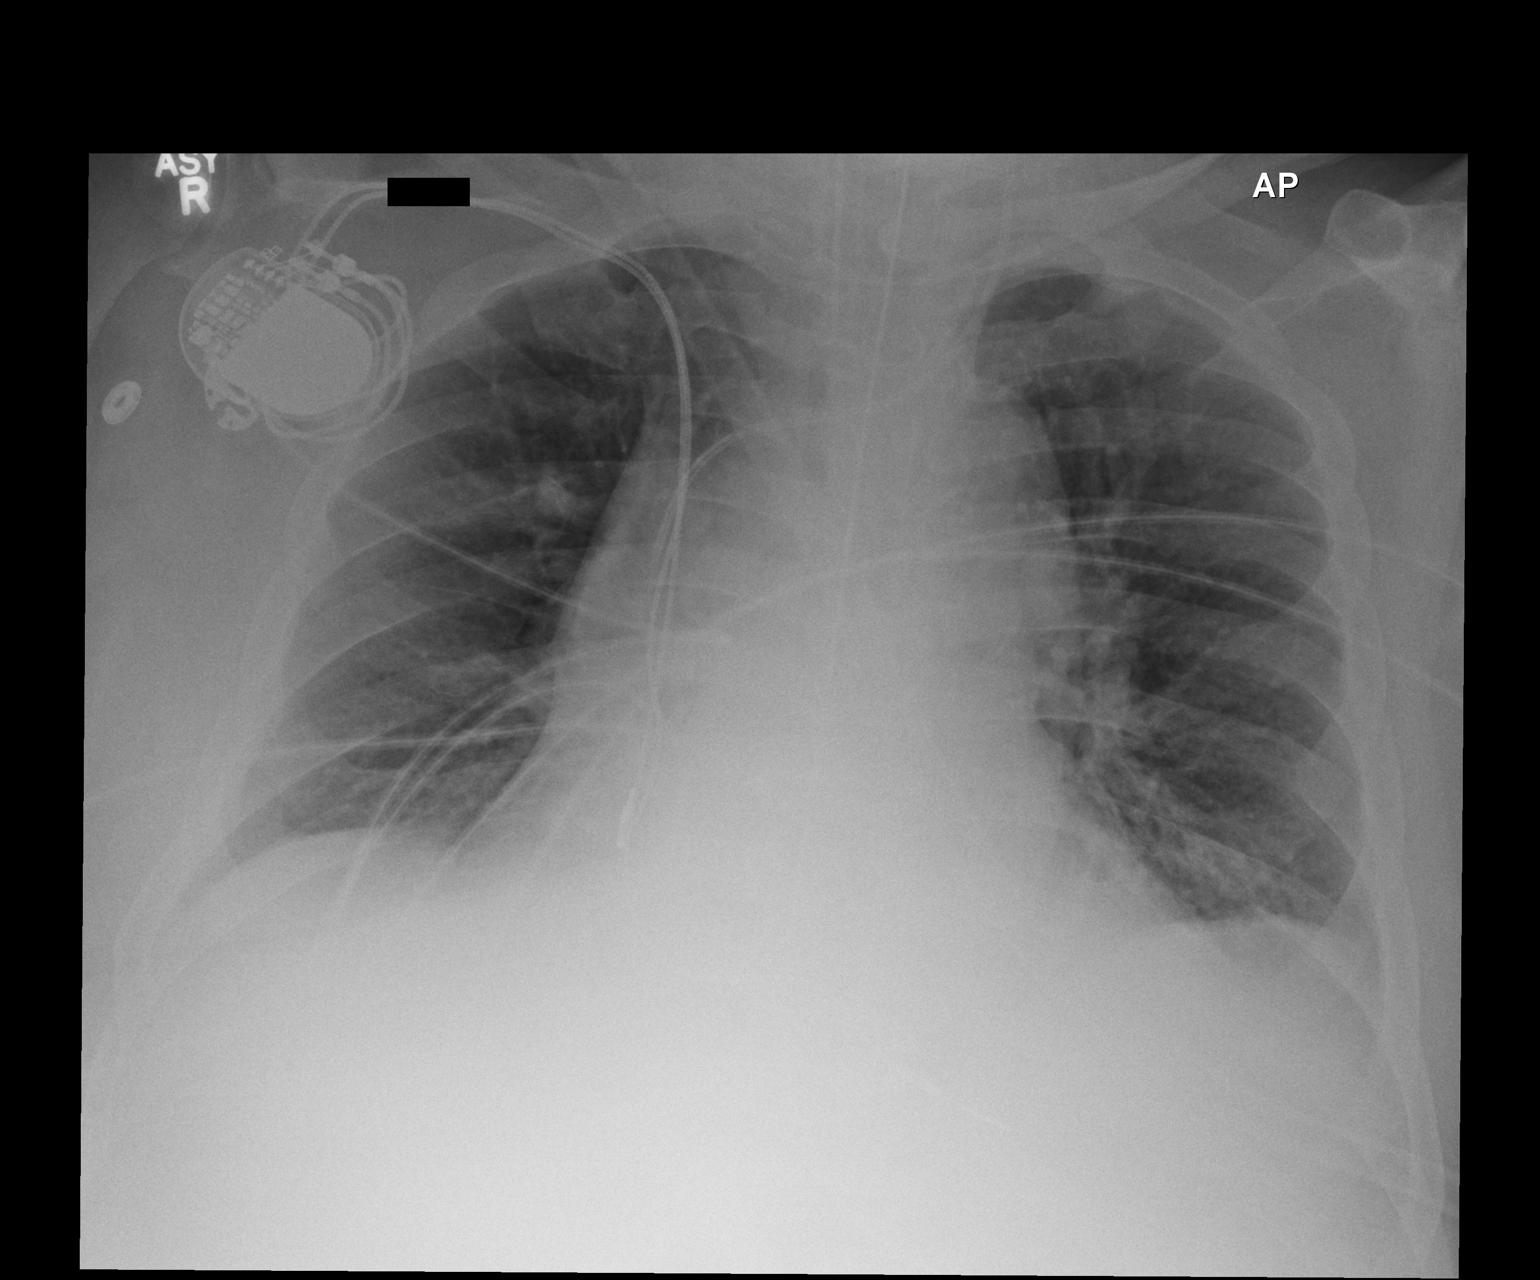

[1 of 1 positions shown; findings below may reference images not displayed]

FINDINGS: Endotracheal tube 5.1 cm above the carina. NG tube tip below left
hemidiaphragm. Left IJ catheter noted with tip projected over
superior vena cava. Cardiomegaly with normal pulmonary vascularity.
Cardiac pacer in stable position. Persistent mild bibasilar
infiltrates and/or atelectasis remain. These are unchanged. No
pneumothorax.
IMPRESSION: 1. Endotracheal tube tip 5.1 cm above the carina. Central line and
NG tube in stable position.
2. Persistent bibasilar infiltrates and/or atelectasis.  No change.

## 2016-01-24 IMAGING — CR DG CHEST 1V PORT
1 series · 1 of 1 positions shown · non-contrast
Comparison: 10/11/2013

CLINICAL DATA: Evaluate endotracheal tube.

EXAM:
PORTABLE CHEST - 1 VIEW

[AP]
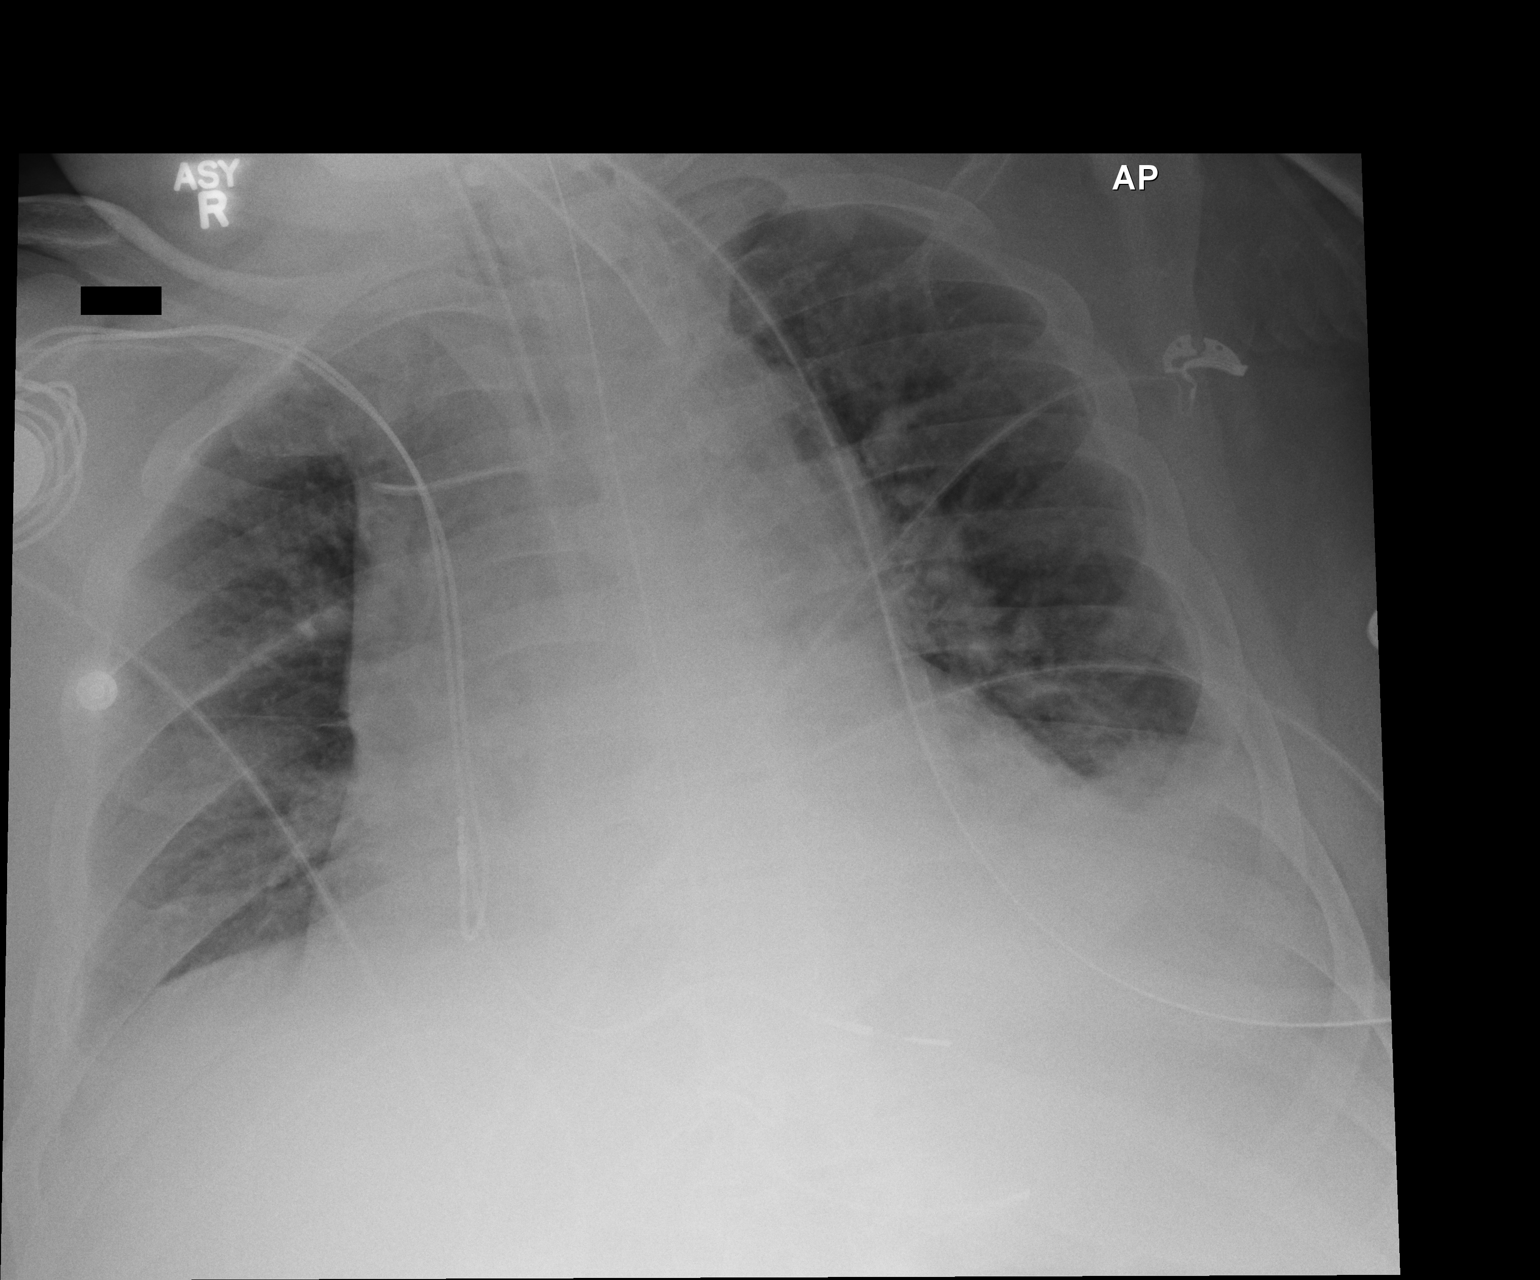

[1 of 1 positions shown; findings below may reference images not displayed]

FINDINGS: Endotracheal tube is roughly 4.6 cm above the carina. Again noted
are low lung volumes. Left basilar densities are most compatible
with atelectasis. Heart size remains enlarged. Stable position of
the right dual lead cardiac pacemaker. The left jugular central
venous catheter tip is now horizontally oriented in the upper SVC
region. Nasogastric tube extends into the abdomen.
IMPRESSION: Low lung volumes with left basilar atelectasis.

Support apparatuses as described.

## 2016-01-24 IMAGING — CR DG CHEST 1V PORT
1 series · 1 of 1 positions shown · non-contrast
Comparison: 10/12/2013

CLINICAL DATA: Tracheostomy tube placement

EXAM:
PORTABLE CHEST - 1 VIEW

[AP]
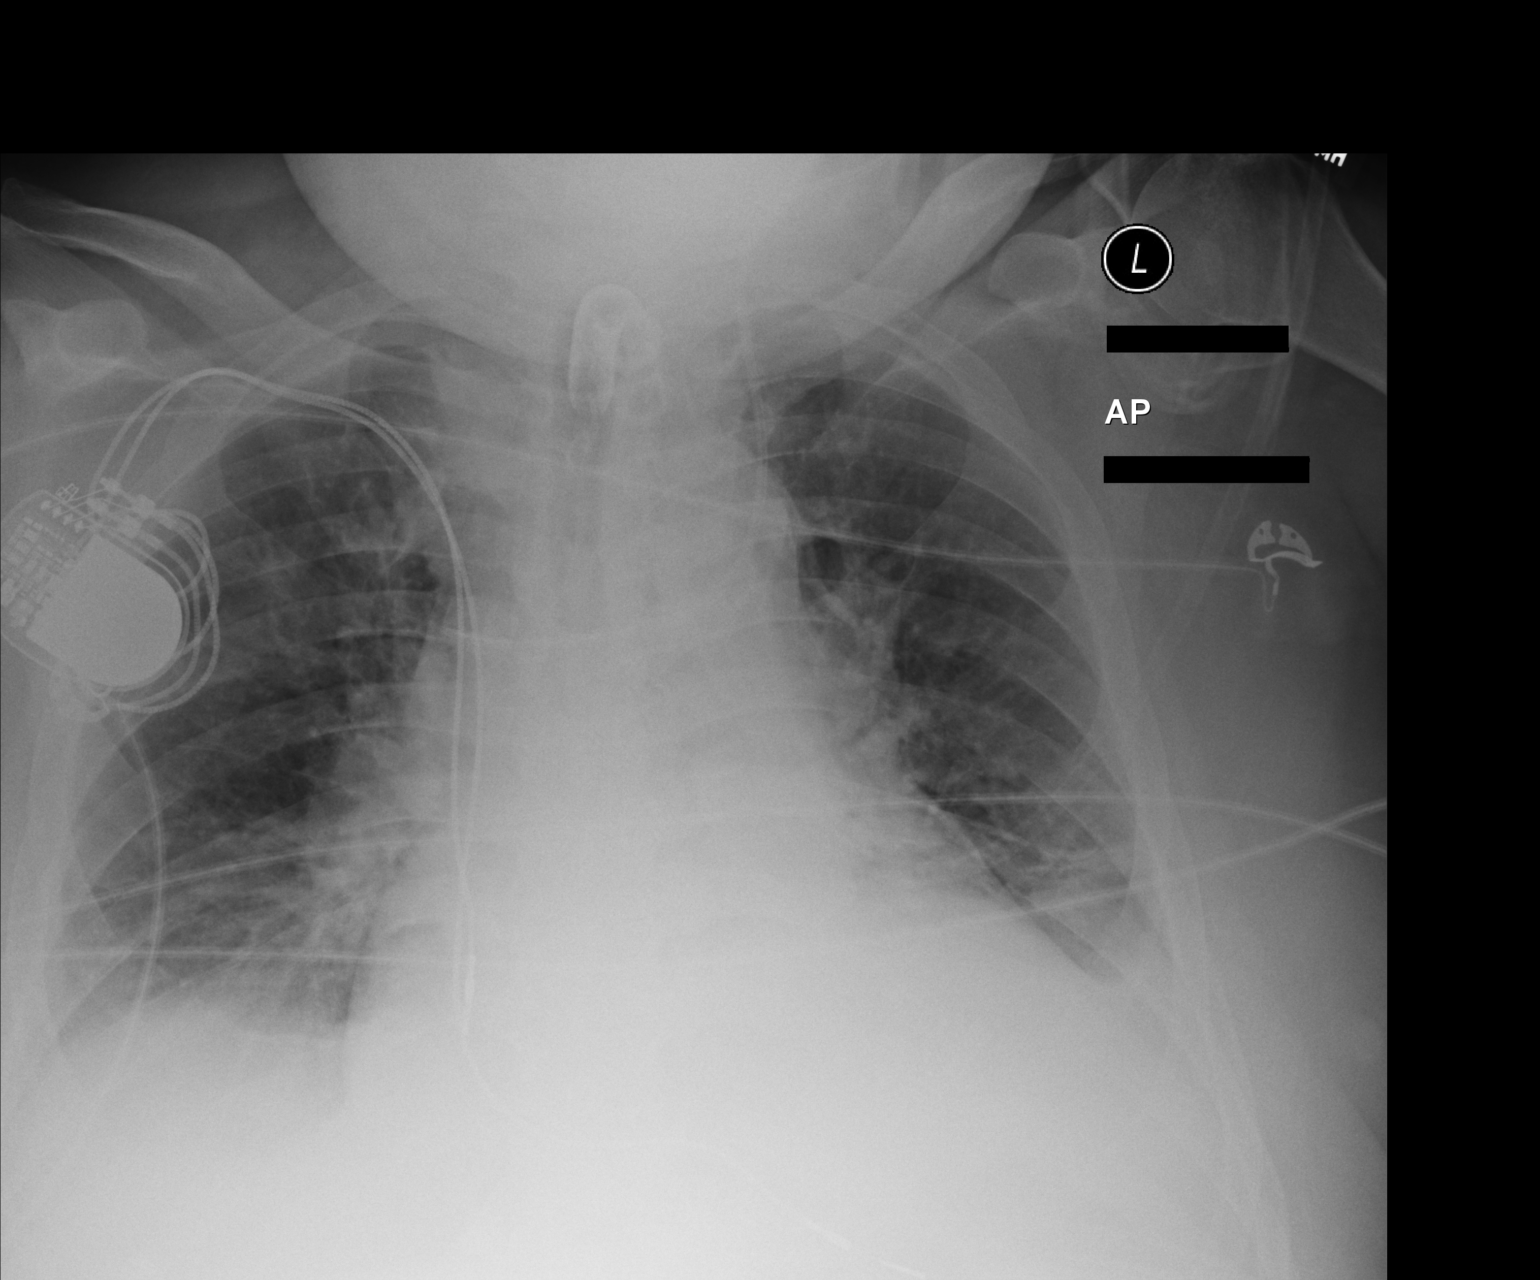

[1 of 1 positions shown; findings below may reference images not displayed]

FINDINGS: There is a tracheostomy tube in place. No diagnostic pneumothorax.
Cardiomegaly again noted. NG tube has been removed. Stable left IJ
central line position. Persistent bilateral basilar atelectasis or
infiltrate. No pulmonary edema.
IMPRESSION: Tracheostomy tube in place. No diagnostic pneumothorax. Bilateral
basilar atelectasis or infiltrate. No pulmonary edema.

## 2016-01-24 IMAGING — CT CT HEAD W/O CM
1 series · 15 of 30 positions shown, 19 images · non-contrast
Comparison: Prior CT from 10/08/2013.

CLINICAL DATA: Followup intracranial hemorrhage

EXAM:
CT HEAD WITHOUT CONTRAST
TECHNIQUE: Contiguous axial images were obtained from the base of the skull
through the vertex without intravenous contrast.

[Series 2: head 5.0 h30s · axial · 0.46mm/px · z∈[+1536,+1681]mm · 15 of 33 slices shown, 19 images]
[im 2/33  brain]
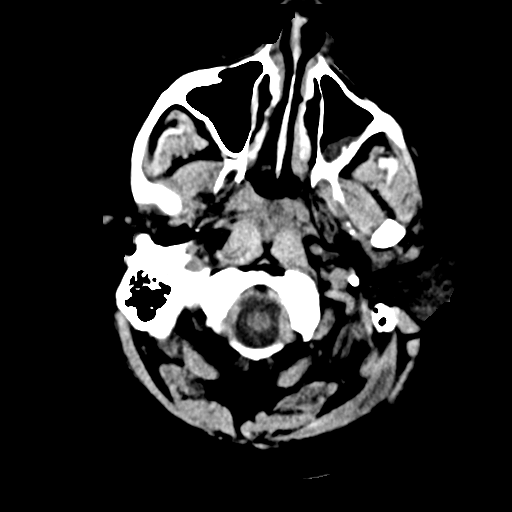
[im 2/33  bone]
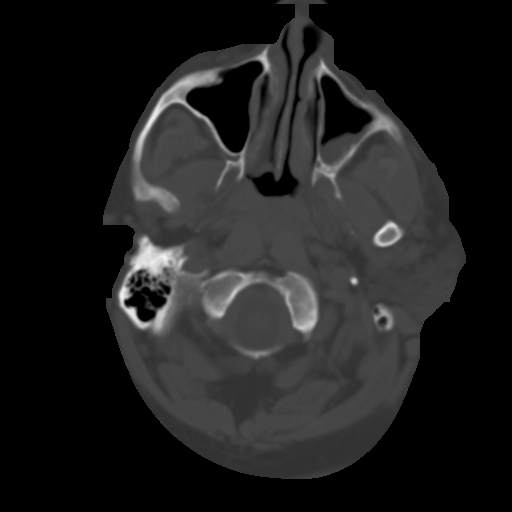
[im 4/33  brain]
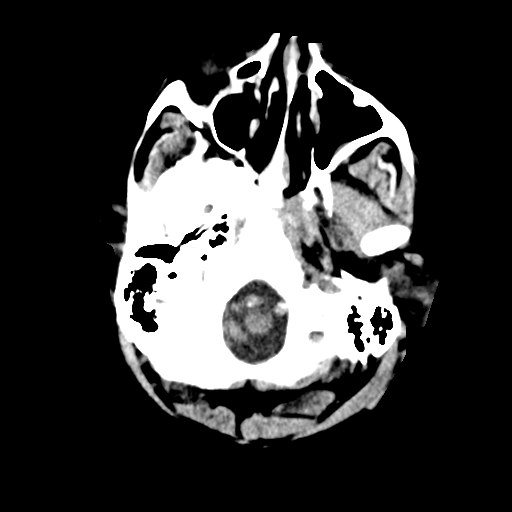
[im 6/33  brain]
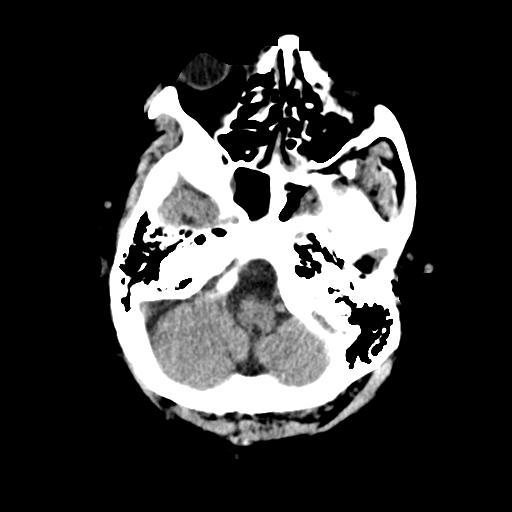
[im 8/33  brain]
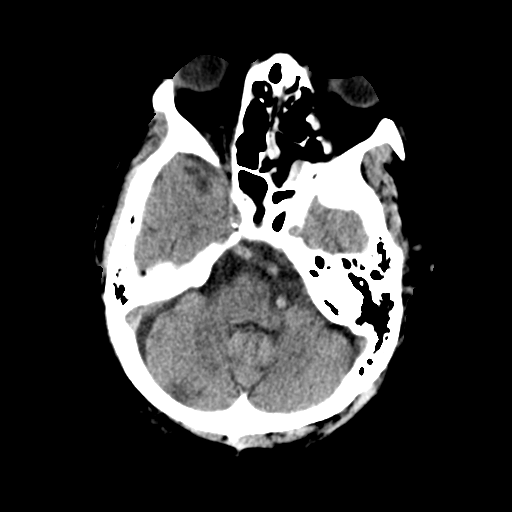
[im 10/33  brain]
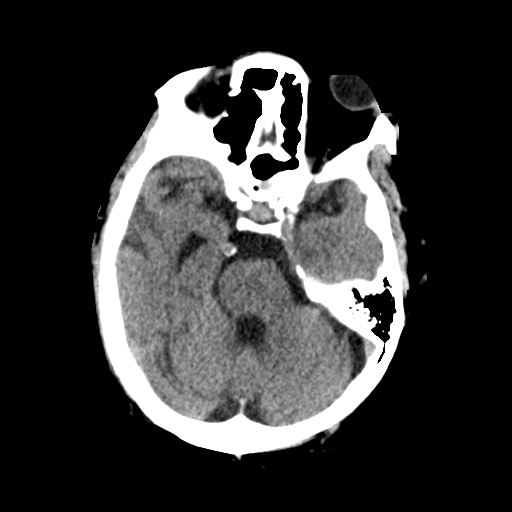
[im 10/33  bone]
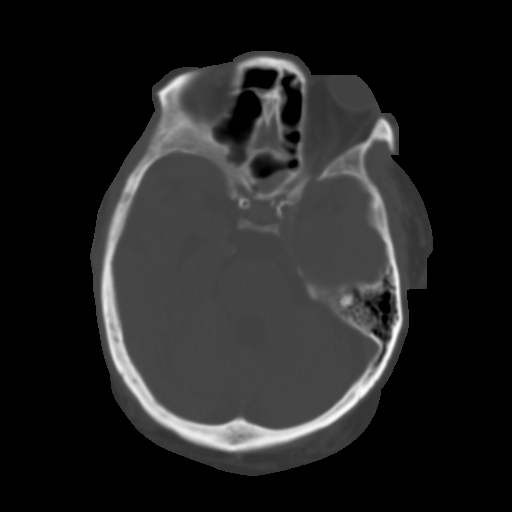
[im 13/33  brain]
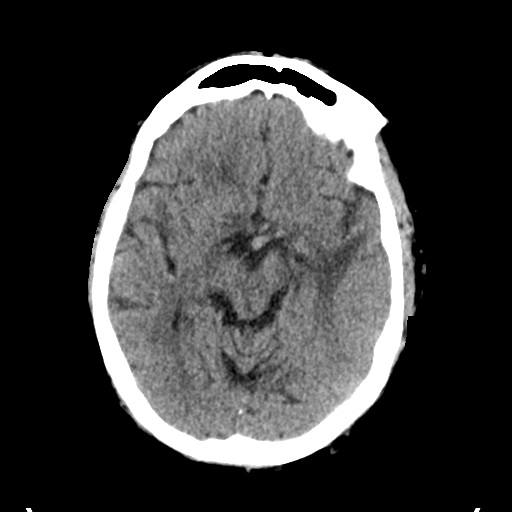
[im 15/33  brain]
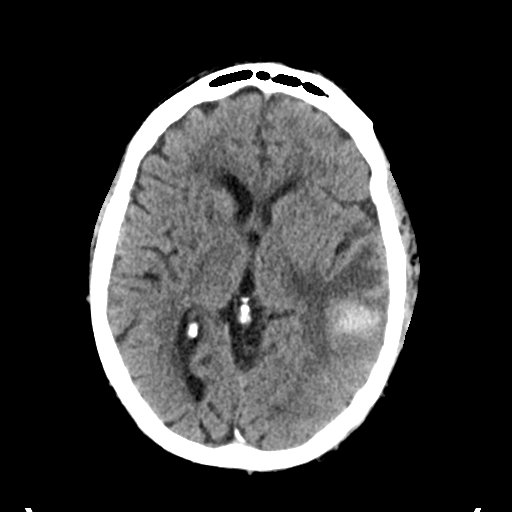
[im 17/33  brain]
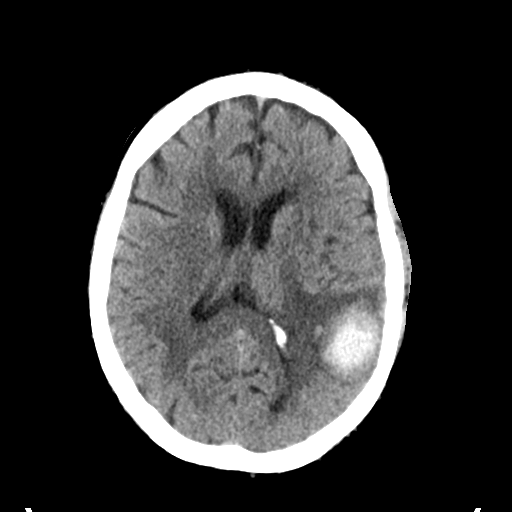
[im 18/33  brain]
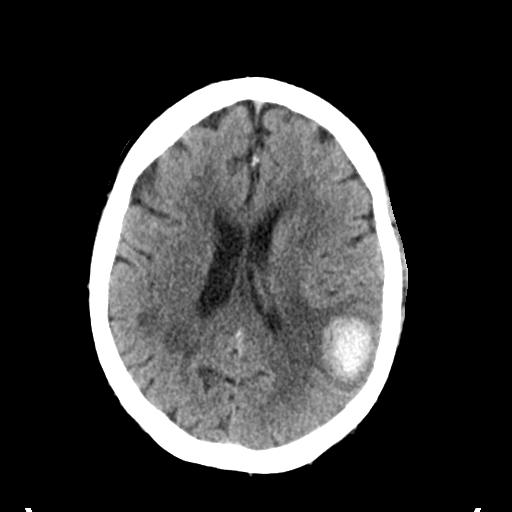
[im 18/33  bone]
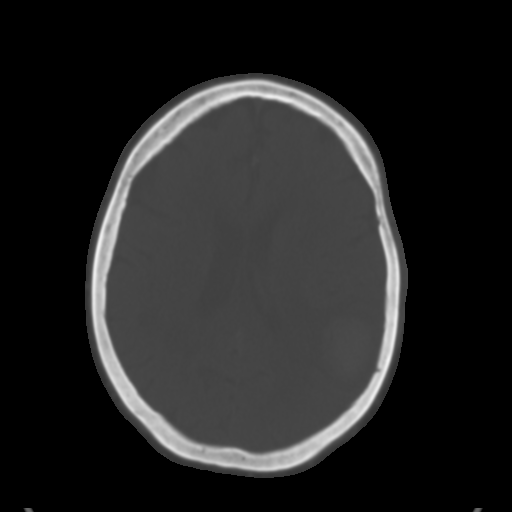
[im 20/33  brain]
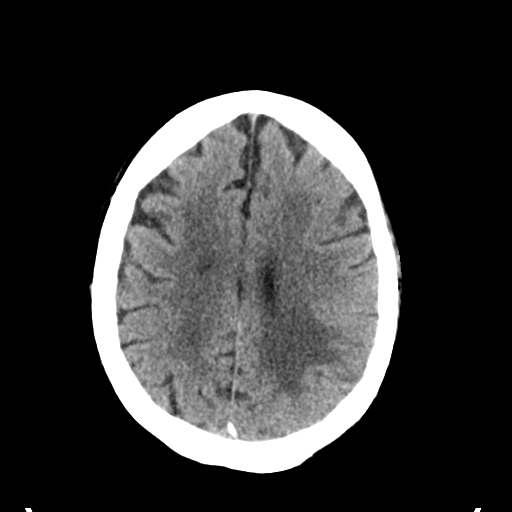
[im 23/33  brain]
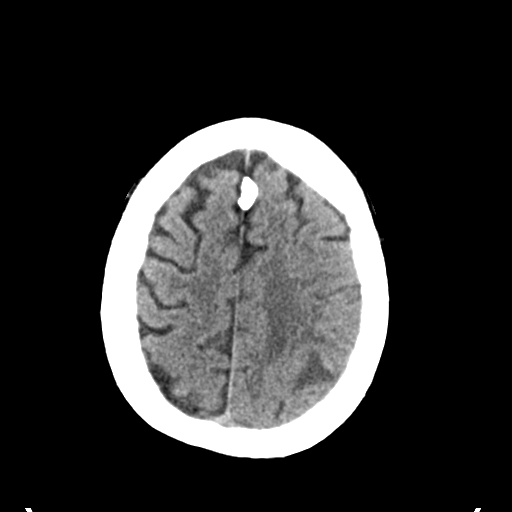
[im 25/33  brain]
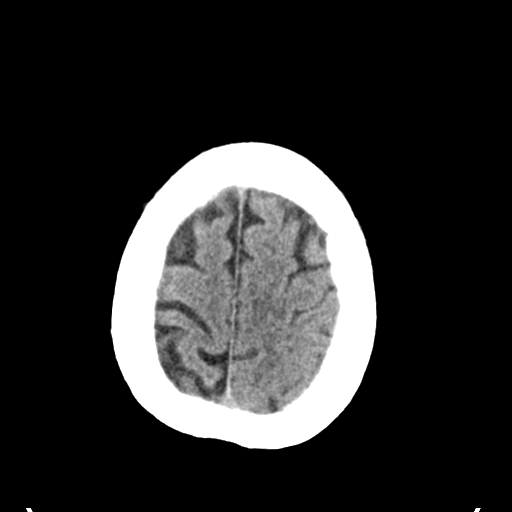
[im 27/33  brain]
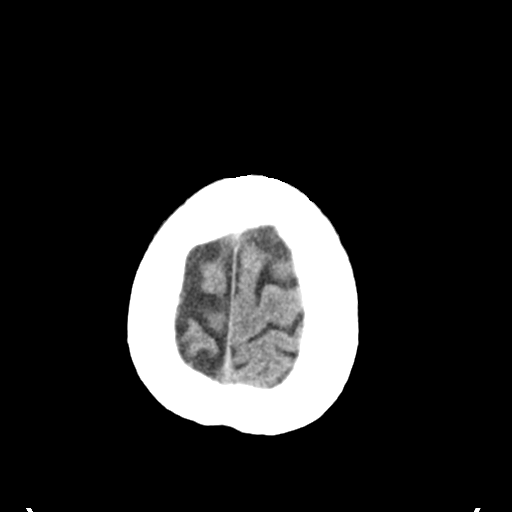
[im 27/33  bone]
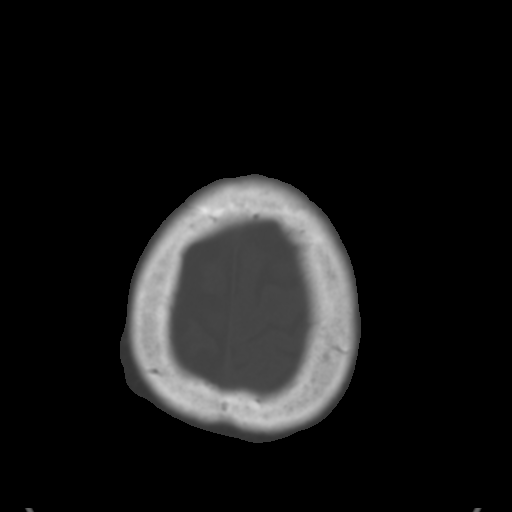
[im 29/33  brain]
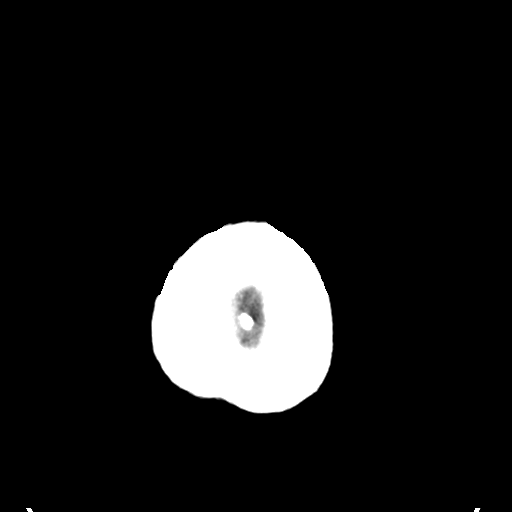
[im 31/33  brain]
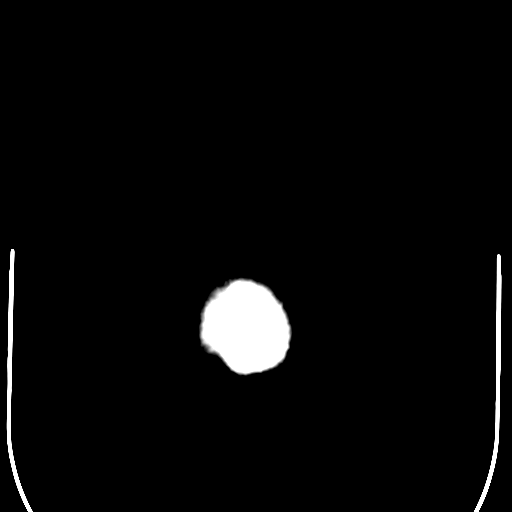

[15 of 30 positions shown; findings below may reference images not displayed]

FINDINGS: Left temporoparietal hemorrhage is slightly decreased in size
measuring 3.4 x 2.6 cm, previously 2.9 x 3.5 cm. Associated
vasogenic edema and sulcal effacement is not significantly changed.
No midline shift.

Cerebral volume within normal limits for age. Scattered and
confluent supratentorial white matter hypodensity is again seen,
compatible with chronic small vessel ischemic changes. No acute
large vessel territory infarct. No new intracranial hemorrhage
identified.

No mass lesion or midline shift. Ventricles are stable in size
without evidence of hydrocephalus. No extra-axial fluid collection.
Dolichoectasia of the basilar artery again noted. Prominent
atherosclerotic calcifications present within the vertebral
arteries, the ectatic basilar artery, and the carotid siphons.

Chronic paranasal sinusitis again noted, slightly improved relative
to prior. Minimal soft tissue density again noted within the left
mastoid air cells. Orbits are unchanged.

Calvarium remains intact.
IMPRESSION: 1. Overall slight interval decrease in size of left temporoparietal
intraparenchymal hemorrhage now measuring 3.4 x 2.6 cm, previously
3.5 x 2.9 cm. Similar localized mass effect without midline shift.
2. No new intracranial hemorrhage or large vessel territory infarct.
3. Slightly improved paranasal sinus disease.

## 2016-01-25 IMAGING — CR DG CHEST 1V PORT
1 series · 1 of 1 positions shown · non-contrast
Comparison: the previous day's study

CLINICAL DATA: Tracheostomy

EXAM:
PORTABLE CHEST - 1 VIEW

[AP]
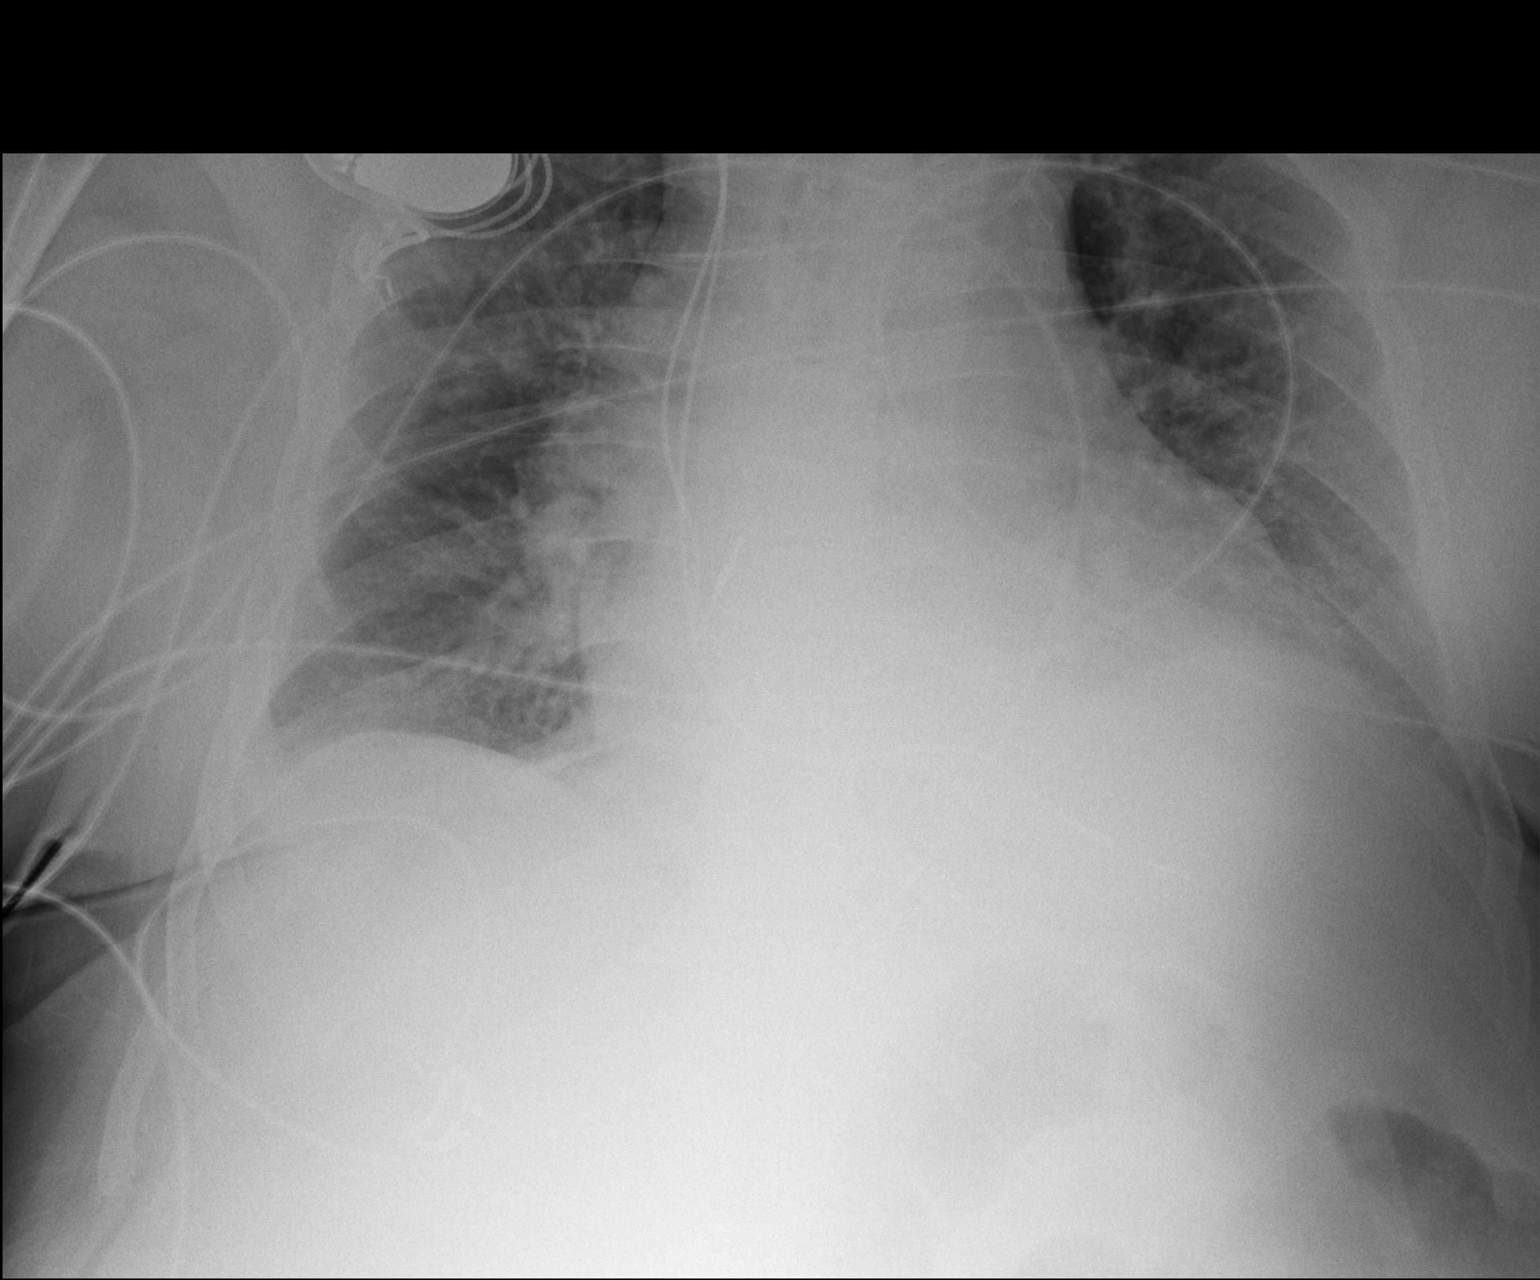

[1 of 1 positions shown; findings below may reference images not displayed]

FINDINGS: Tracheostomy device is partially seen, appears stable in position.
Left IJ central line directed towards the lateral wall of the SVC.
Right subclavian pacemaker leads stable. Moderate cardiomegaly as
before. Low lung volumes with infrahilar atelectasis/ consolidation
left greater than right as before, crowding of perihilar
bronchovascular markings. .
No effusion.
Visualized skeletal structures are unremarkable.
IMPRESSION: Little change since previous day's exam

## 2016-01-26 IMAGING — CR DG ABD PORTABLE 1V
1 series · 1 of 1 positions shown · non-contrast
Comparison: DG ABD PORTABLE 1V dated 10/08/2013;

CLINICAL DATA: Abdominal distention

EXAM:
PORTABLE ABDOMEN - 1 VIEW

[AP]
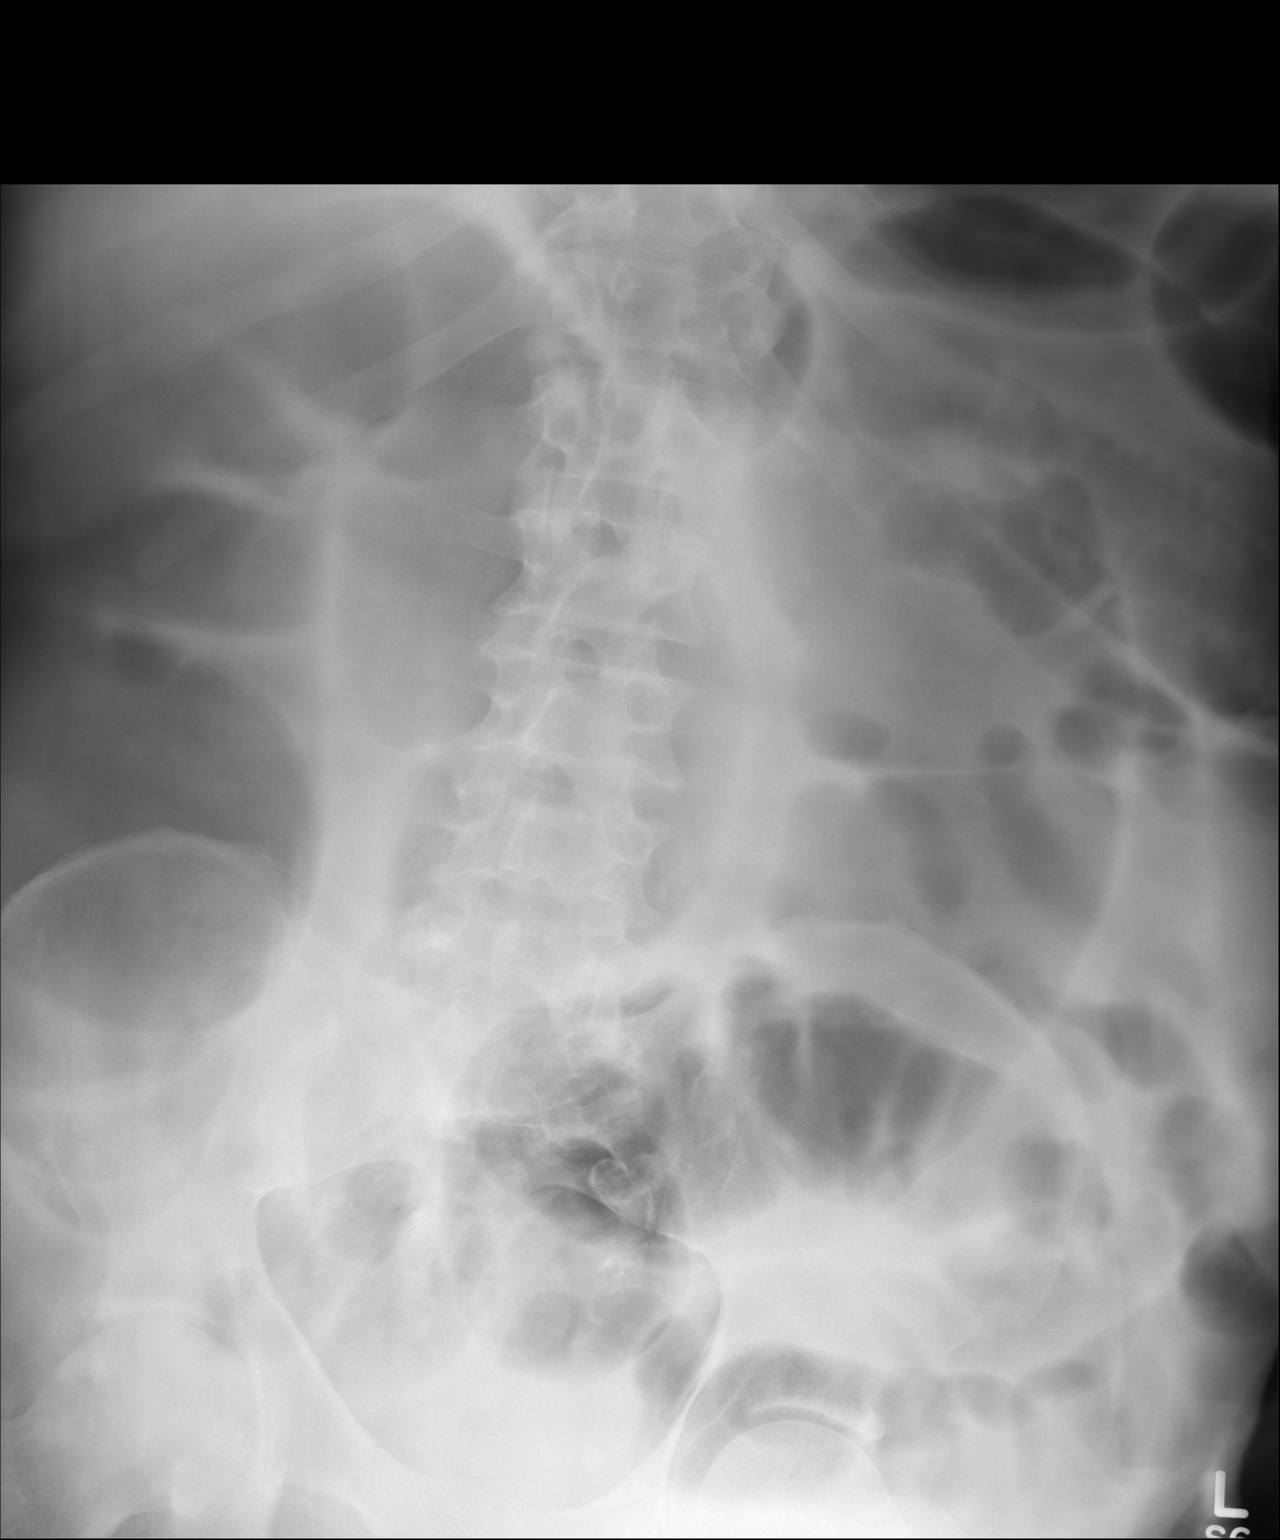

[1 of 1 positions shown; findings below may reference images not displayed]

DG ABD PORTABLE 1V
dated 10/04/2013; DG ABD PORTABLE 1V dated 10/02/2013; DG ABD PORTABLE
1V dated 10/04/2013; DG CHEST 1V PORT dated 10/14/2013
FINDINGS: Worsening now moderate to marked gas distention of predominantly the
colon with index loop of redundant transverse colon within the left
upper abdominal quadrant now measuring approximately 8.9 cm in
diameter. No definitive gas distention of the small bowel.

Interval removal of enteric tube though a suspected gastrostomy tube
overlies the left upper abdominal quadrant.

Nondiagnostic evaluation pneumoperitoneum secondary to supine
positioning exclusion the lower thorax. No definite pneumatosis or
portal venous gas.
IMPRESSION: 1. Overall findings most suggestive worsening ileus, though note,
distal obstruction cannot be entirely excluded on the basis of this
examination. Continued attention on follow-up may be performed as
clinically indicated.
2. Interval removal of enteric tube with suspected placement of a
feeding gastrostomy tube.

## 2016-01-26 IMAGING — CT CT HEAD W/O CM
1 of 3 series · 15 of 30 positions shown, 19 images · non-contrast
Comparison: 10/12/2013

CLINICAL DATA: Mental status changes.

EXAM:
CT HEAD WITHOUT CONTRAST
TECHNIQUE: Contiguous axial images were obtained from the base of the skull
through the vertex without intravenous contrast.

[Series 3: head 2.0 h70h · axial · 0.49mm/px · z∈[+482,+616]mm · 15 of 77 slices shown, 19 images]
[im 5/77  brain]
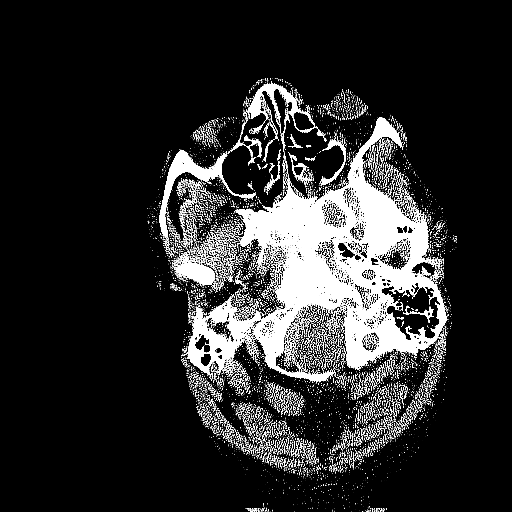
[im 5/77  bone]
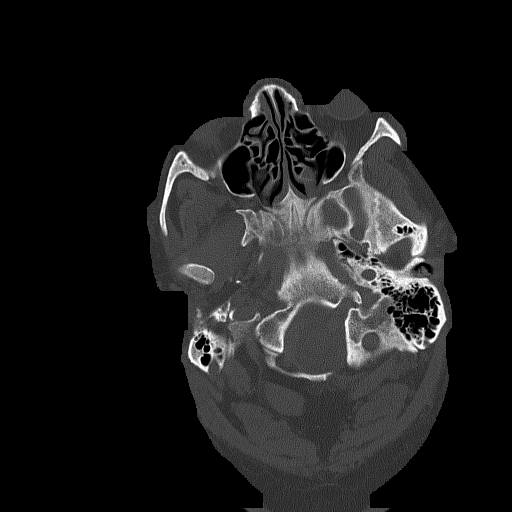
[im 9/77  brain]
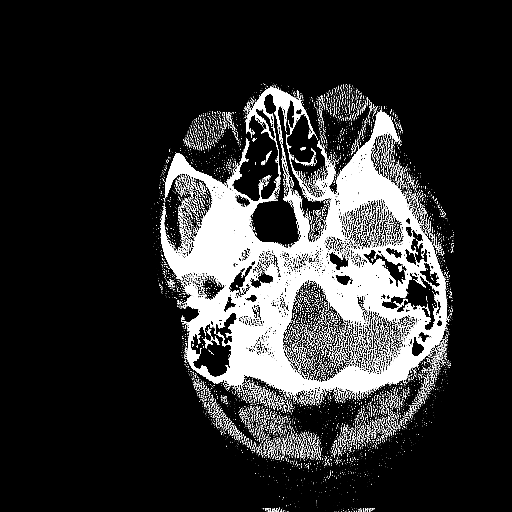
[im 13/77  brain]
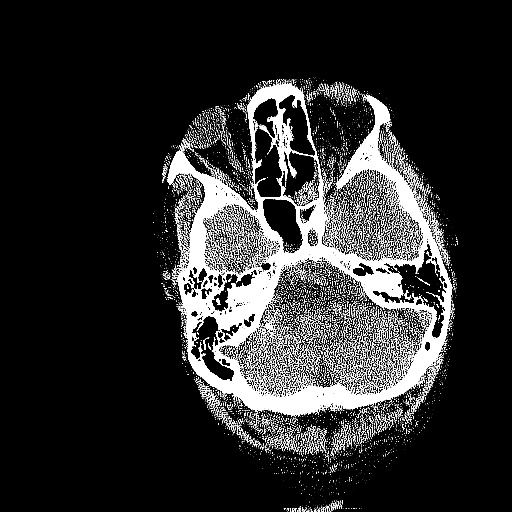
[im 17/77  brain]
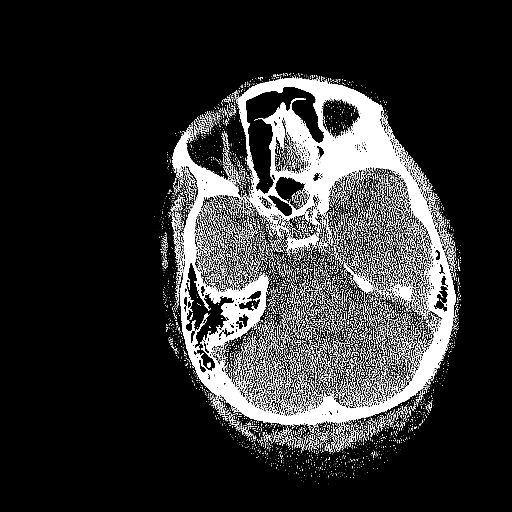
[im 26/77  brain]
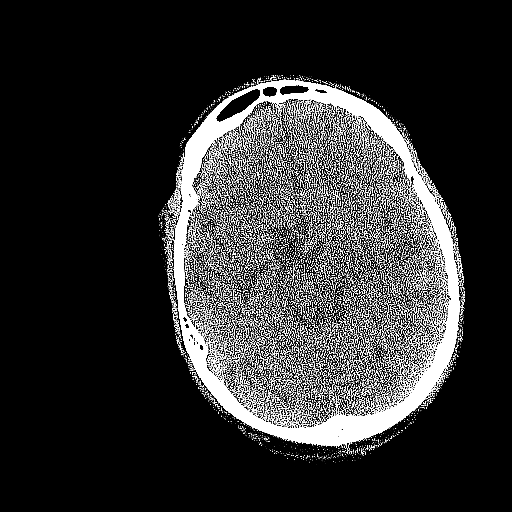
[im 26/77  bone]
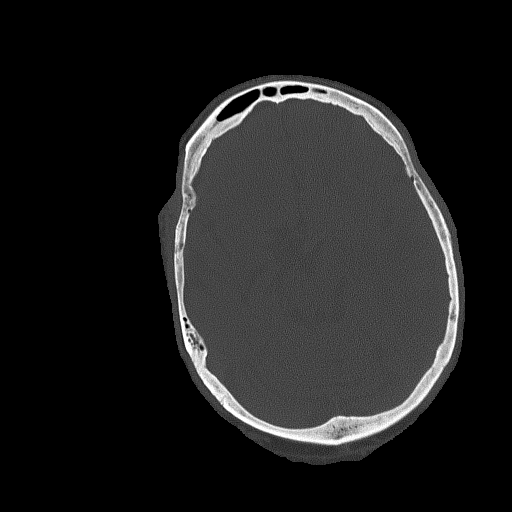
[im 30/77  brain]
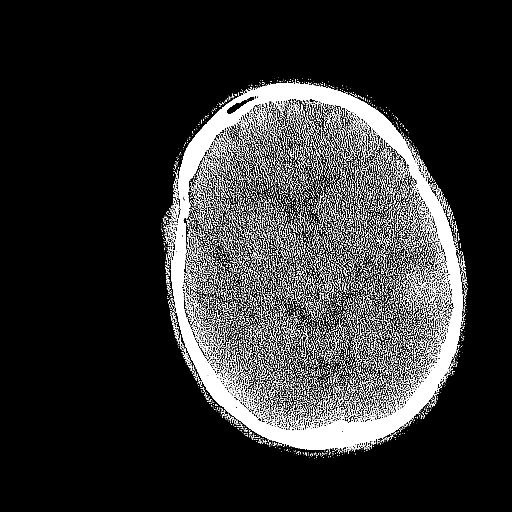
[im 34/77  brain]
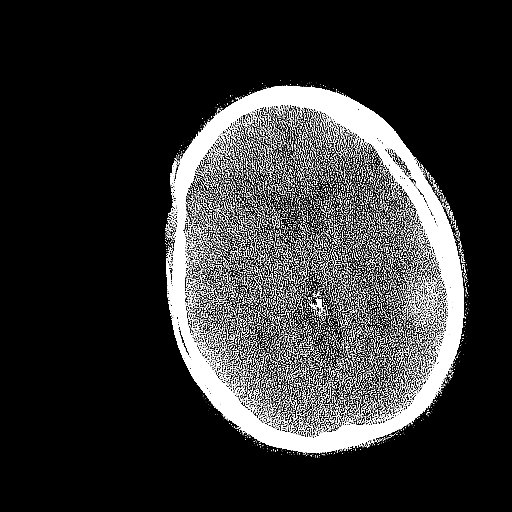
[im 39/77  brain]
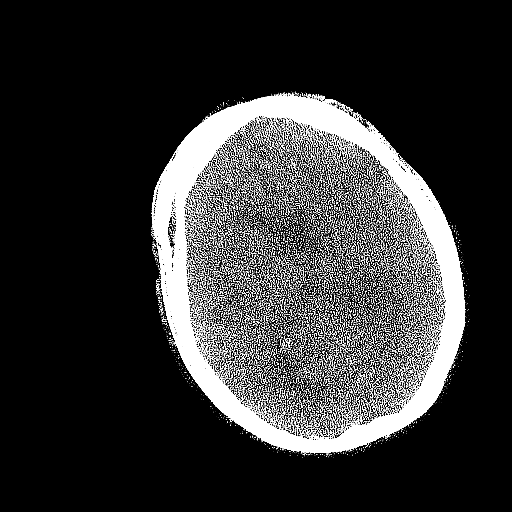
[im 43/77  brain]
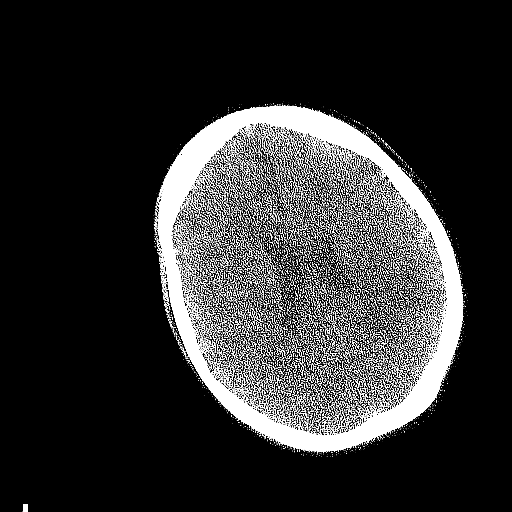
[im 43/77  bone]
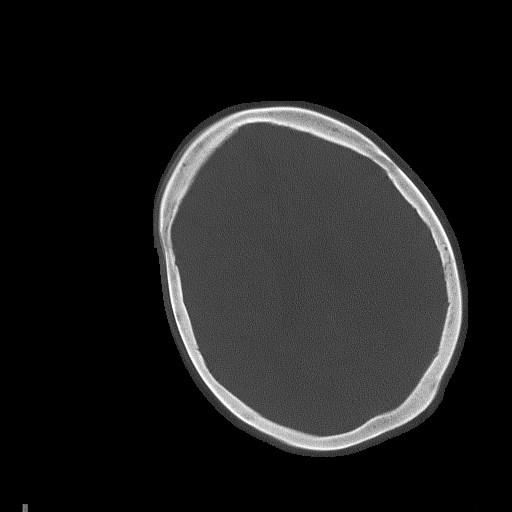
[im 47/77  brain]
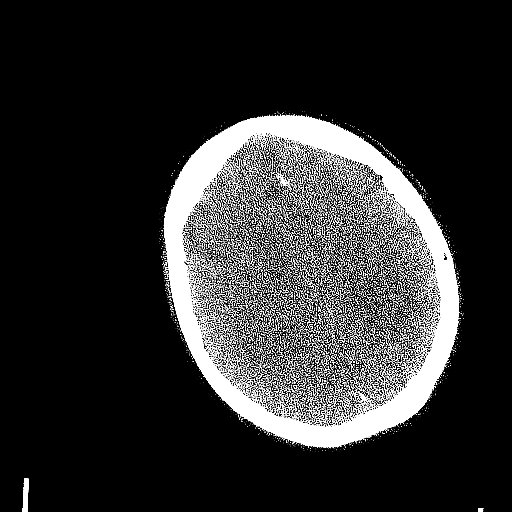
[im 51/77  brain]
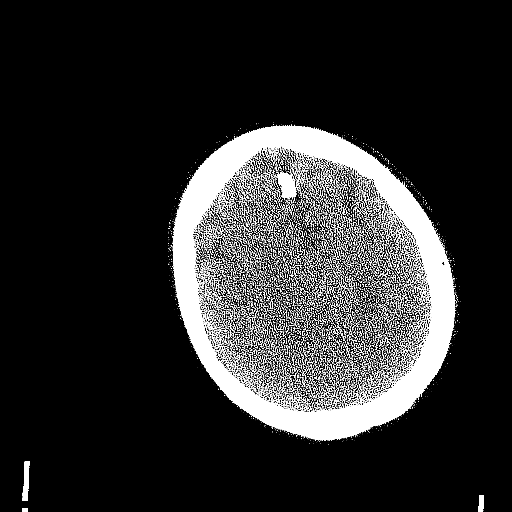
[im 60/77  brain]
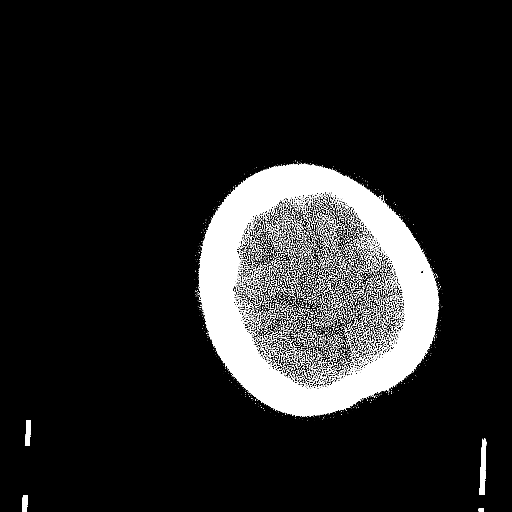
[im 64/77  brain]
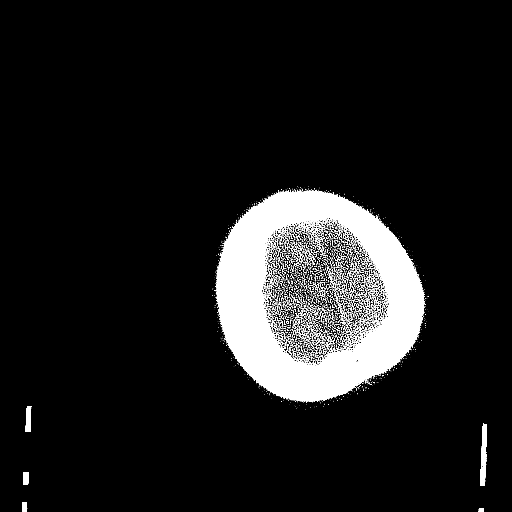
[im 64/77  bone]
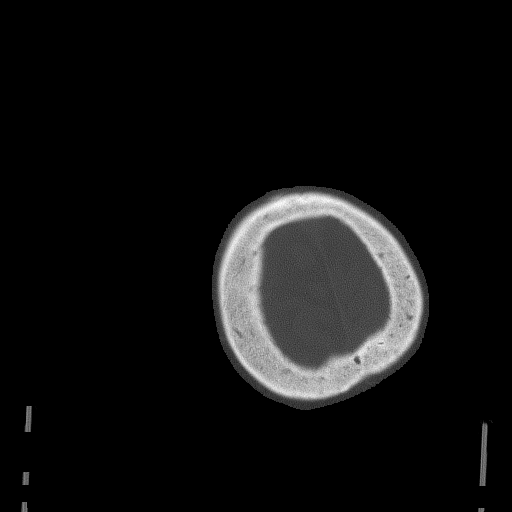
[im 68/77  brain]
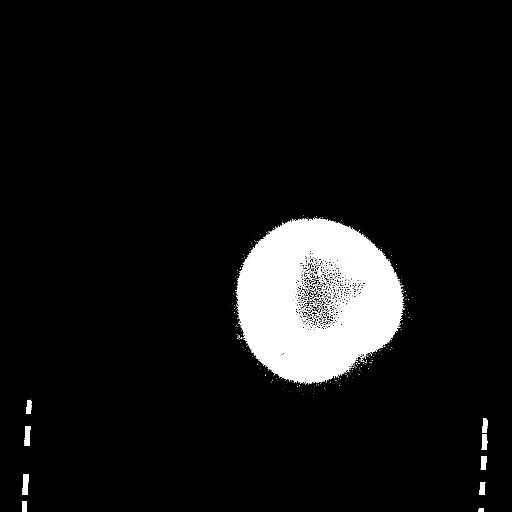
[im 72/77  brain]
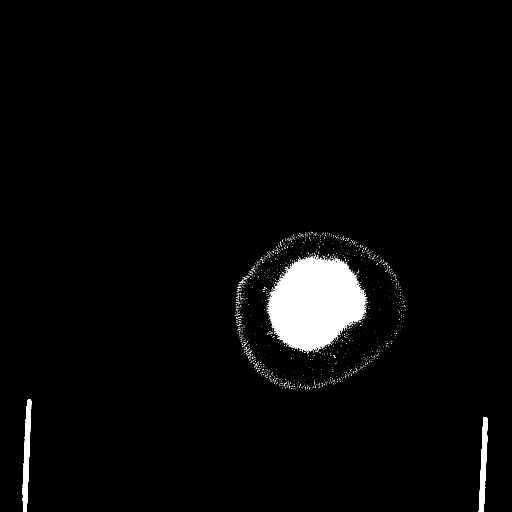

[15 of 30 positions shown; findings below may reference images not displayed]

FINDINGS: Left temporal parietal hematoma has decreased slightly in size now
measuring 3 x 2.3 cm versus prior 3.3 x 2.6 cm. Surrounding
vasogenic edema minimally changed.

No new intracranial hemorrhage detected.

Prominent small vessel disease type changes without CT evidence of
large acute infarct.

Atrophy without hydrocephalus.

Opacification sphenoid sinus air cells and ethmoid sinus air cells
once again noted.

Vascular calcifications.
IMPRESSION: Left temporal parietal hematoma has decreased slightly in size now
measuring 3 x 2.3 cm versus prior 3.3 x 2.6 cm. Surrounding
vasogenic edema minimally changed.

No new intracranial hemorrhage detected.

Prominent small vessel disease type changes without CT evidence of
large acute infarct.

Opacification sphenoid sinus air cells and ethmoid sinus air cells
once again noted.

## 2016-01-26 IMAGING — CR DG CHEST 1V PORT
2 series · 2 of 2 positions shown · non-contrast
Comparison: DG CHEST 1V PORT dated 10/13/2013

CLINICAL DATA: Tracheostomy.  Diabetes.  Obstructive sleep apnea.

EXAM:
PORTABLE CHEST - 1 VIEW

[AP (1 of 2)]
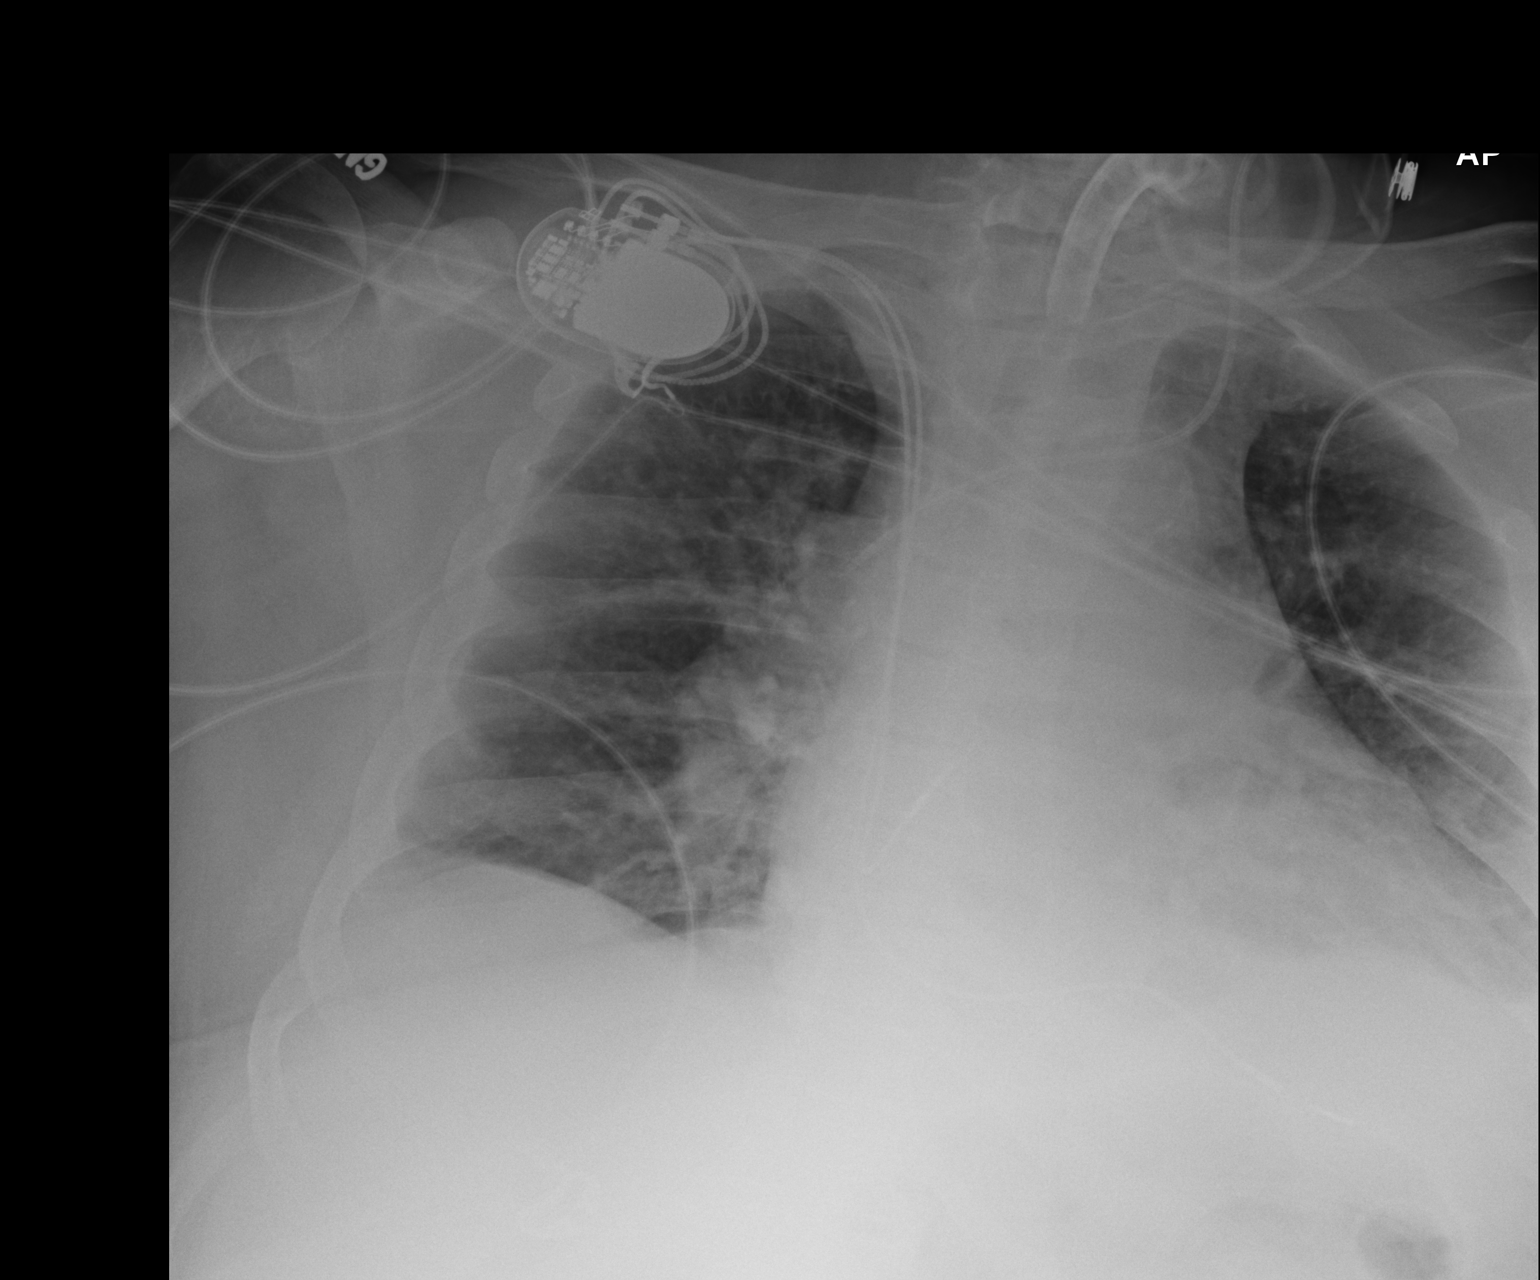

[AP (2 of 2)]
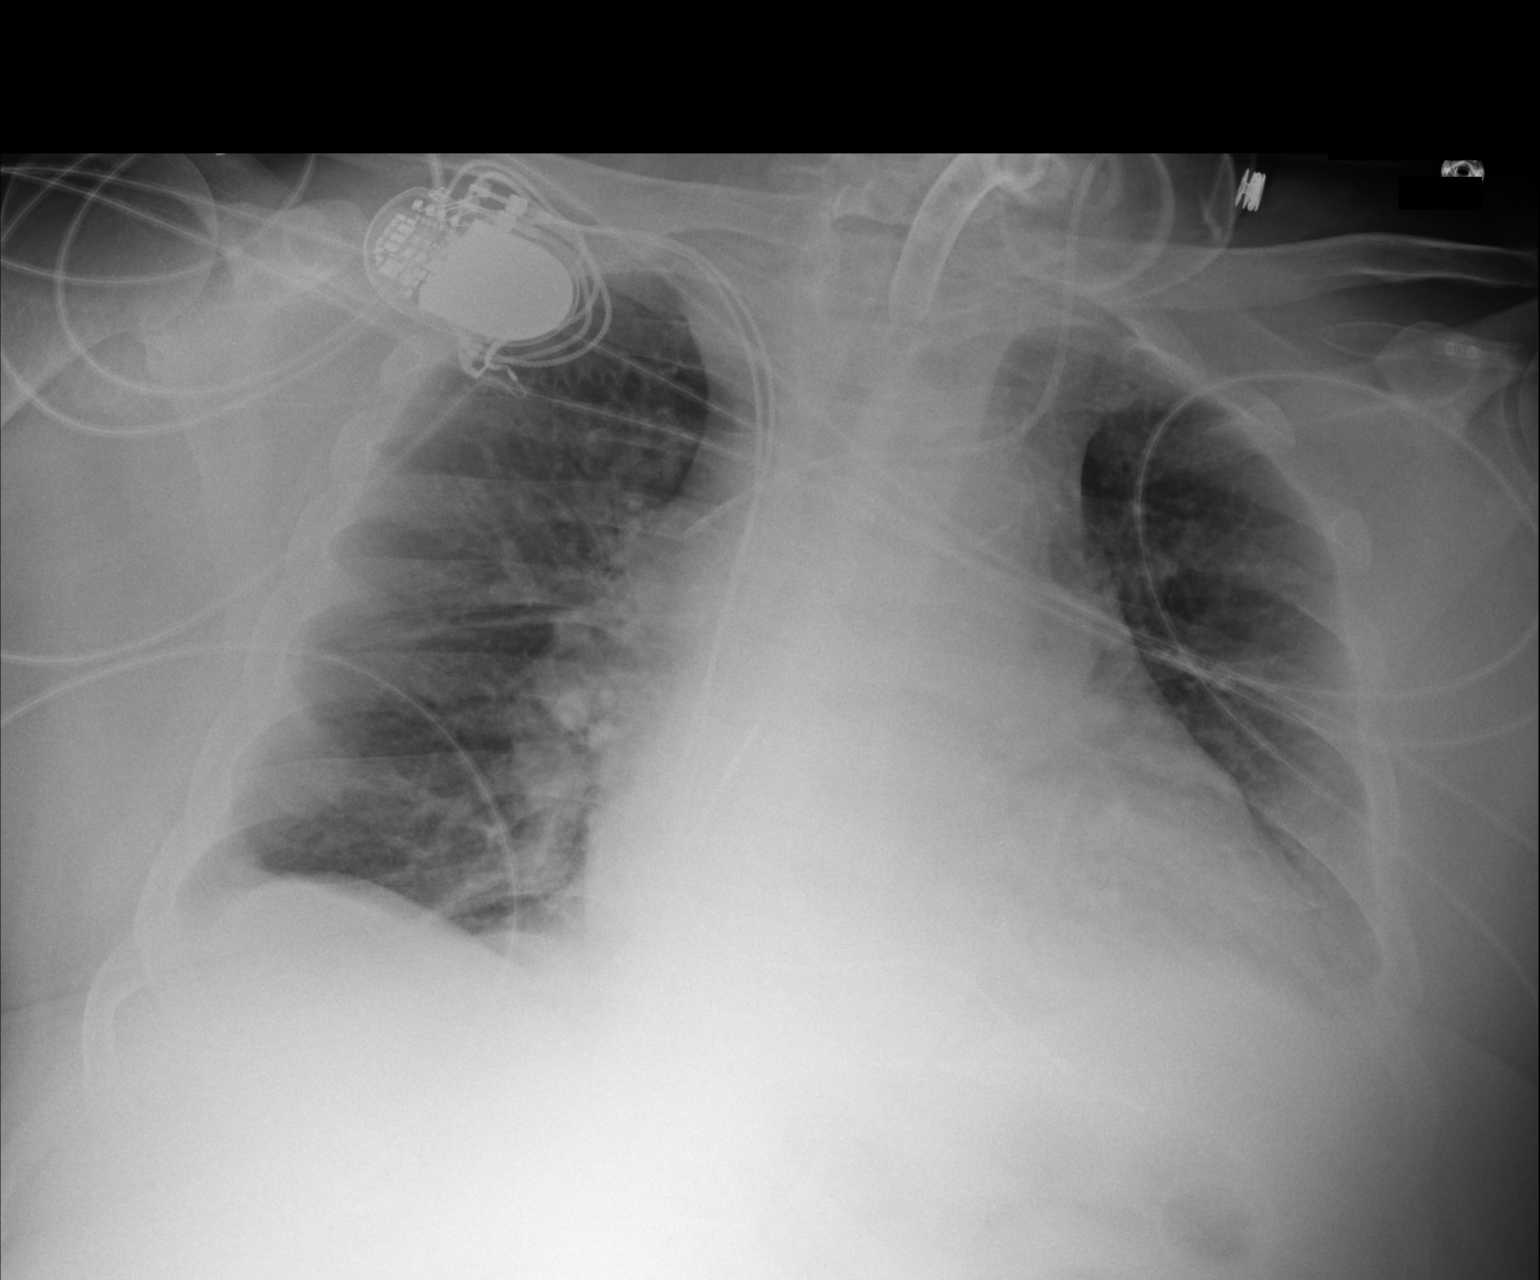

[2 of 2 positions shown; findings below may reference images not displayed]

FINDINGS: Two frontal radiographs. Tracheostomy appropriate position. Patient
rotated left. Left internal jugular line terminates at the high SVC.
Pacer with leads at right atrium and right ventricle. No lead
discontinuity.

Cardiomegaly accentuated by AP portable technique. Possible small
left pleural effusion. No pneumothorax. No congestive failure.
Improved aeration with decreased right and similar left base
airspace opacities.
IMPRESSION: Cardiomegaly with slightly improved aeration. Patchy bibasilar
atelectasis remains.

Possible small left pleural effusion.

## 2016-01-28 IMAGING — CR DG ABD PORTABLE 1V
4 series · 4 of 4 positions shown · non-contrast
Comparison: 10/14/2013

CLINICAL DATA: Evaluate peg tube position.

EXAM:
PORTABLE ABDOMEN - 1 VIEW

[AP (1 of 4)]
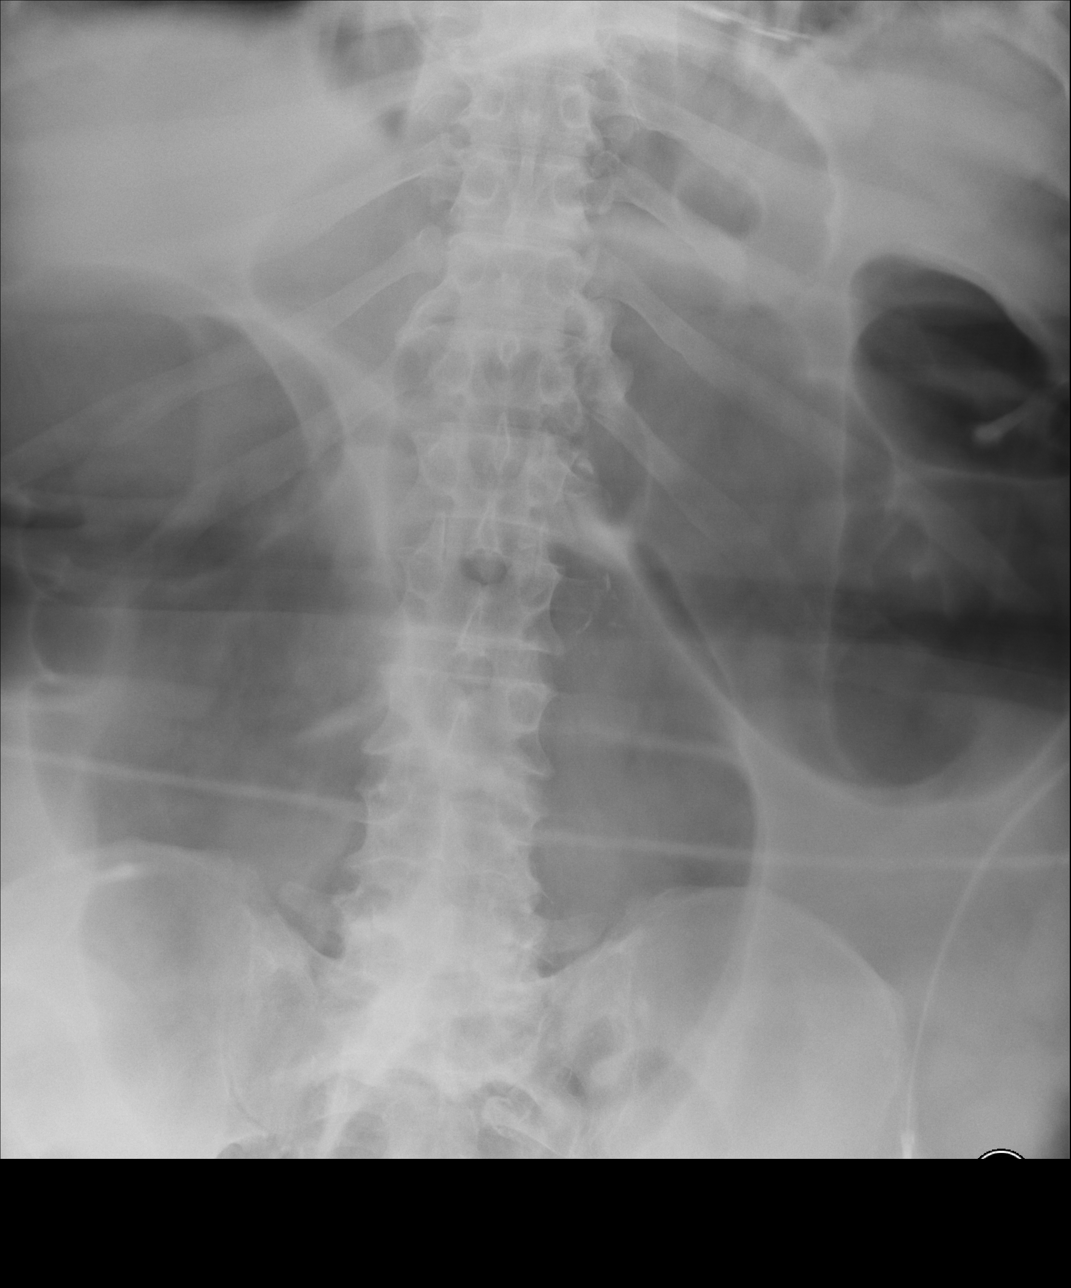

[AP (2 of 4)]
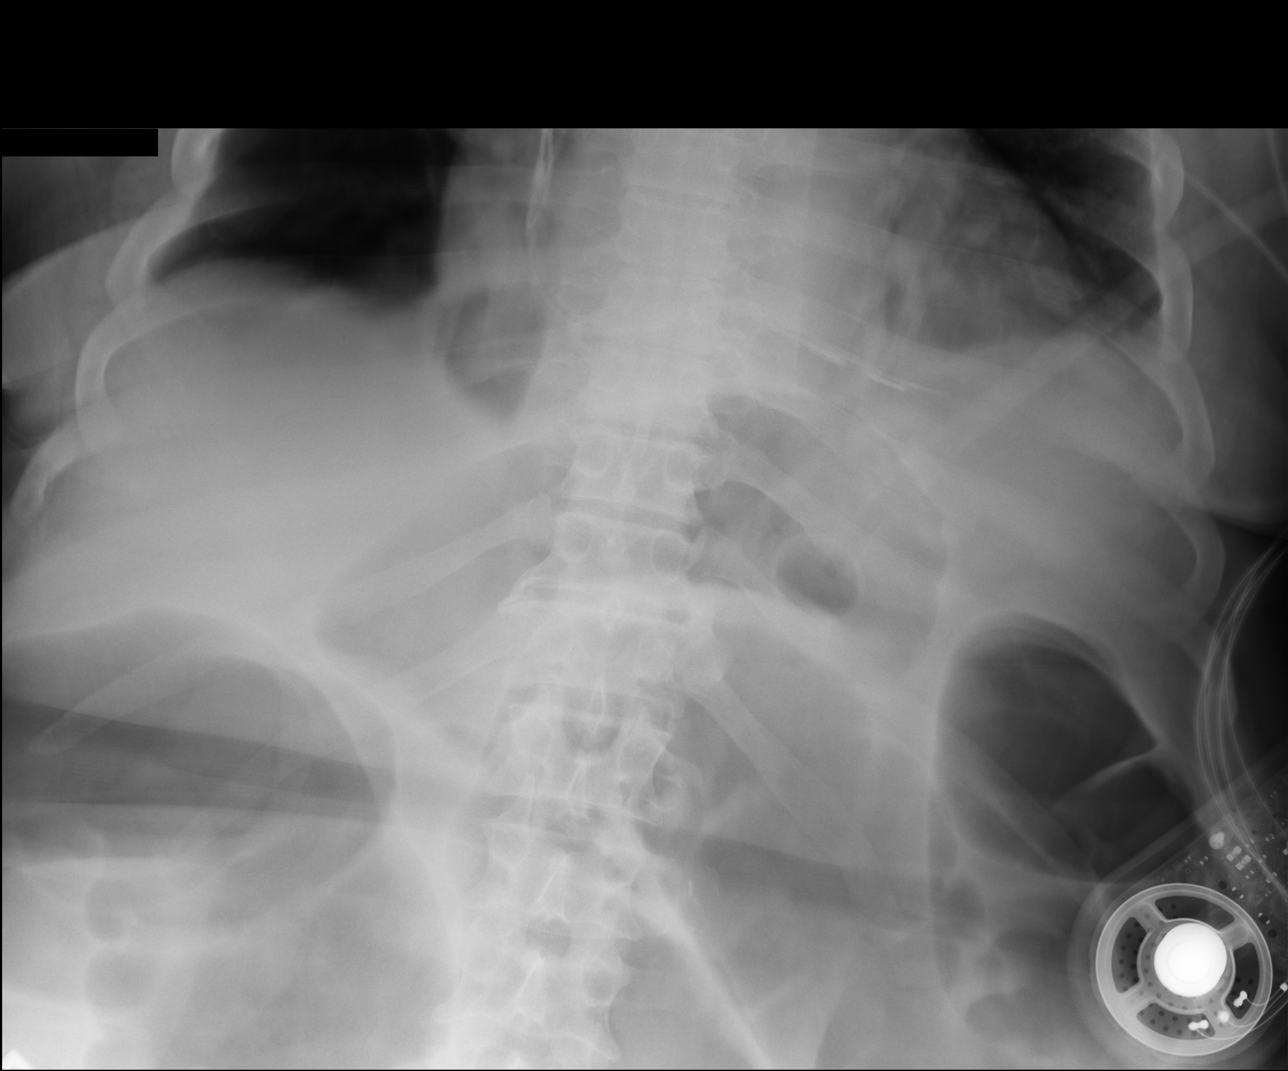

[AP (3 of 4)]
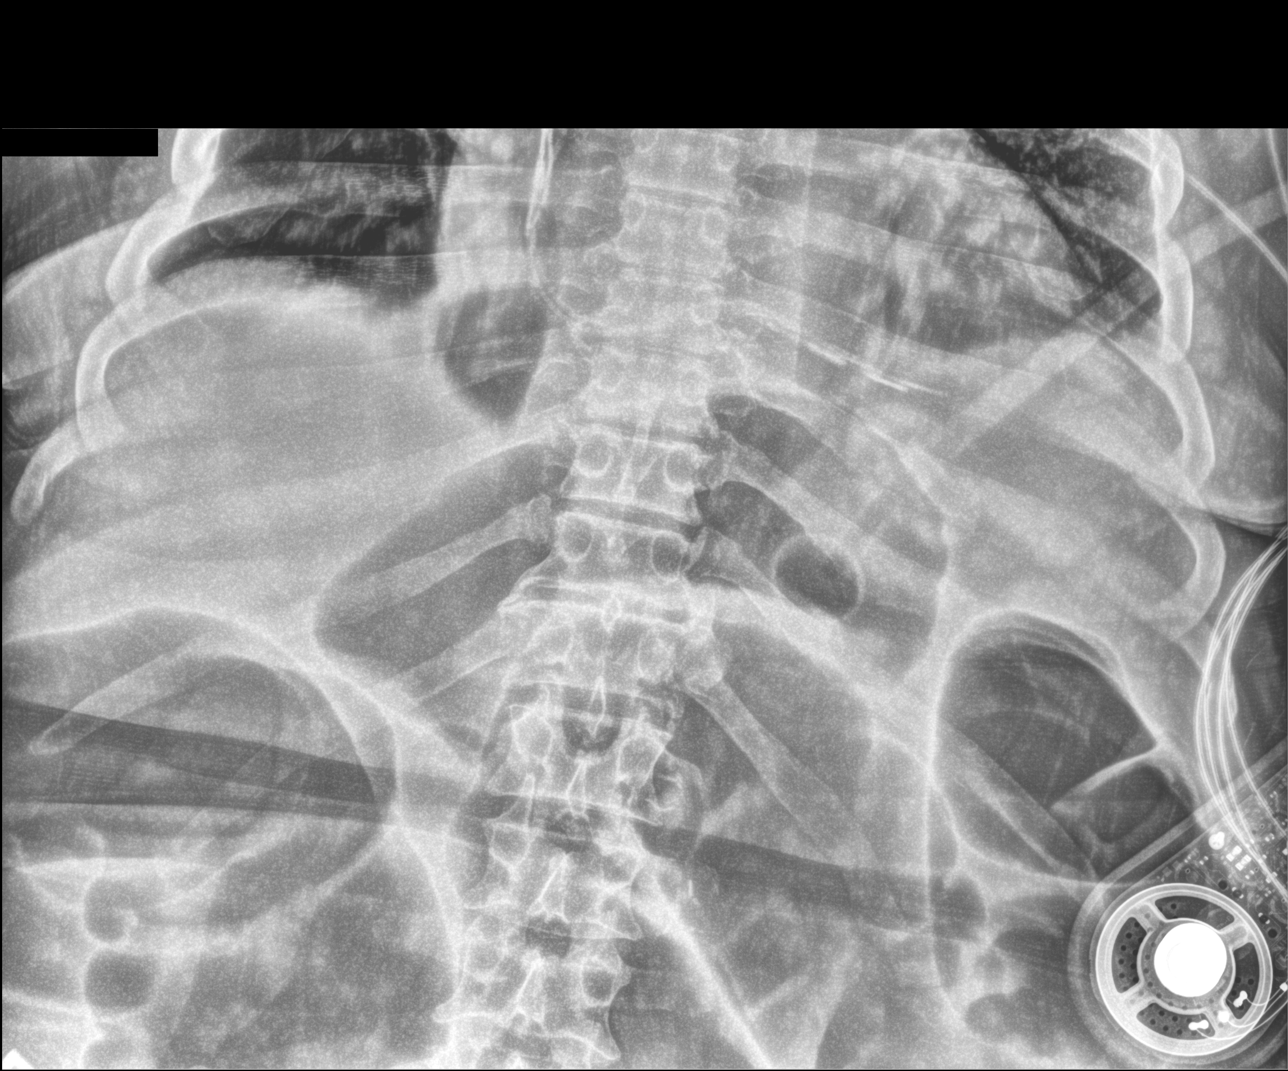

[AP (4 of 4)]
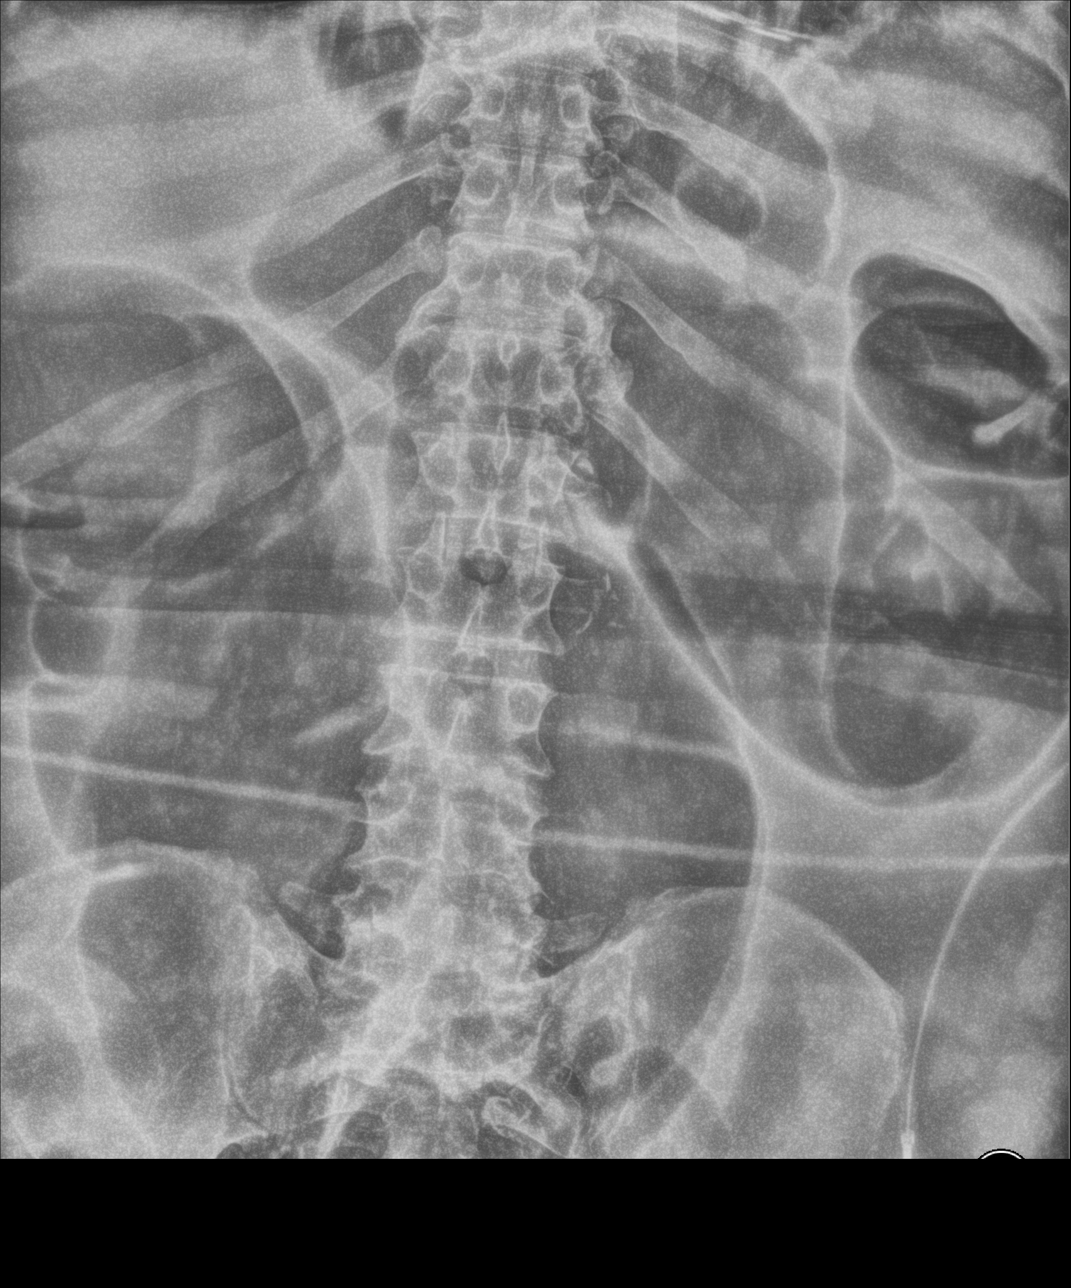

[4 of 4 positions shown; findings below may reference images not displayed]

FINDINGS: Persistent dilated bowel most of which appears to be a colon.
Findings suggest a colonic ileus. The gastrostomy tube appears to be
well-positioned in the upper abdomen overlying the gastric air
bubble.
IMPRESSION: Colonic ileus.

Feeding gastrostomy tube appears to be well-positioned in the upper
abdomen.

## 2016-01-29 IMAGING — DX DG ABD PORTABLE 1V
1 series · 1 of 1 positions shown · non-contrast
Comparison: Abdomen films of 10/16/2013

CLINICAL DATA: Peg position

EXAM:
PORTABLE ABDOMEN - 1 VIEW

[supine ap]
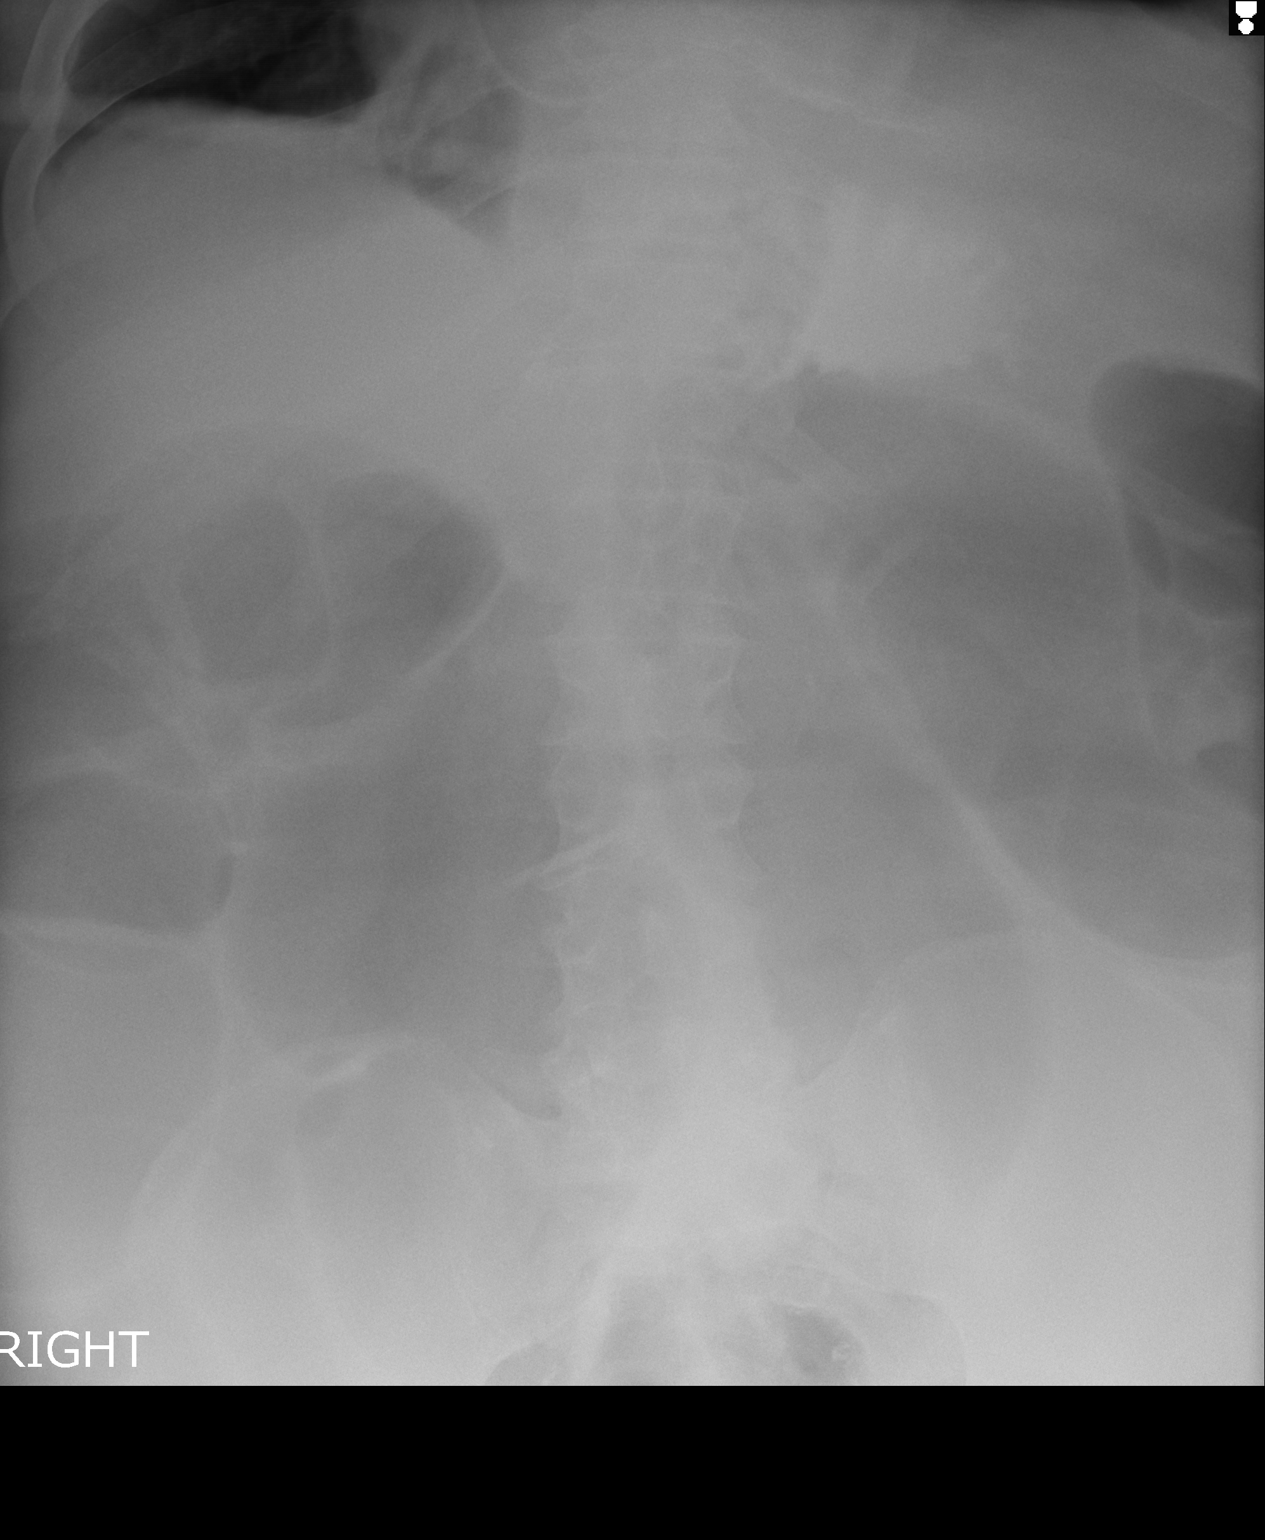

[1 of 1 positions shown; findings below may reference images not displayed]

FINDINGS: The tip of the feeding tube is not well seen on plain film, but
contrast has been injected via the PEG tube. The contrast appears to
fill a loop of proximal small bowel.
IMPRESSION: Injected contrast via the PEG tube appears to be in a loop of small
bowel. The exact position of the PEG tube cannot be determined on
the present film.

## 2016-01-30 IMAGING — CR DG CHEST 1V PORT
1 series · 1 of 1 positions shown · non-contrast
Comparison: DG CHEST 1V PORT dated 10/18/2013

CLINICAL DATA: Repositioning of PICC line

EXAM:
PORTABLE CHEST - 1 VIEW

[AP]
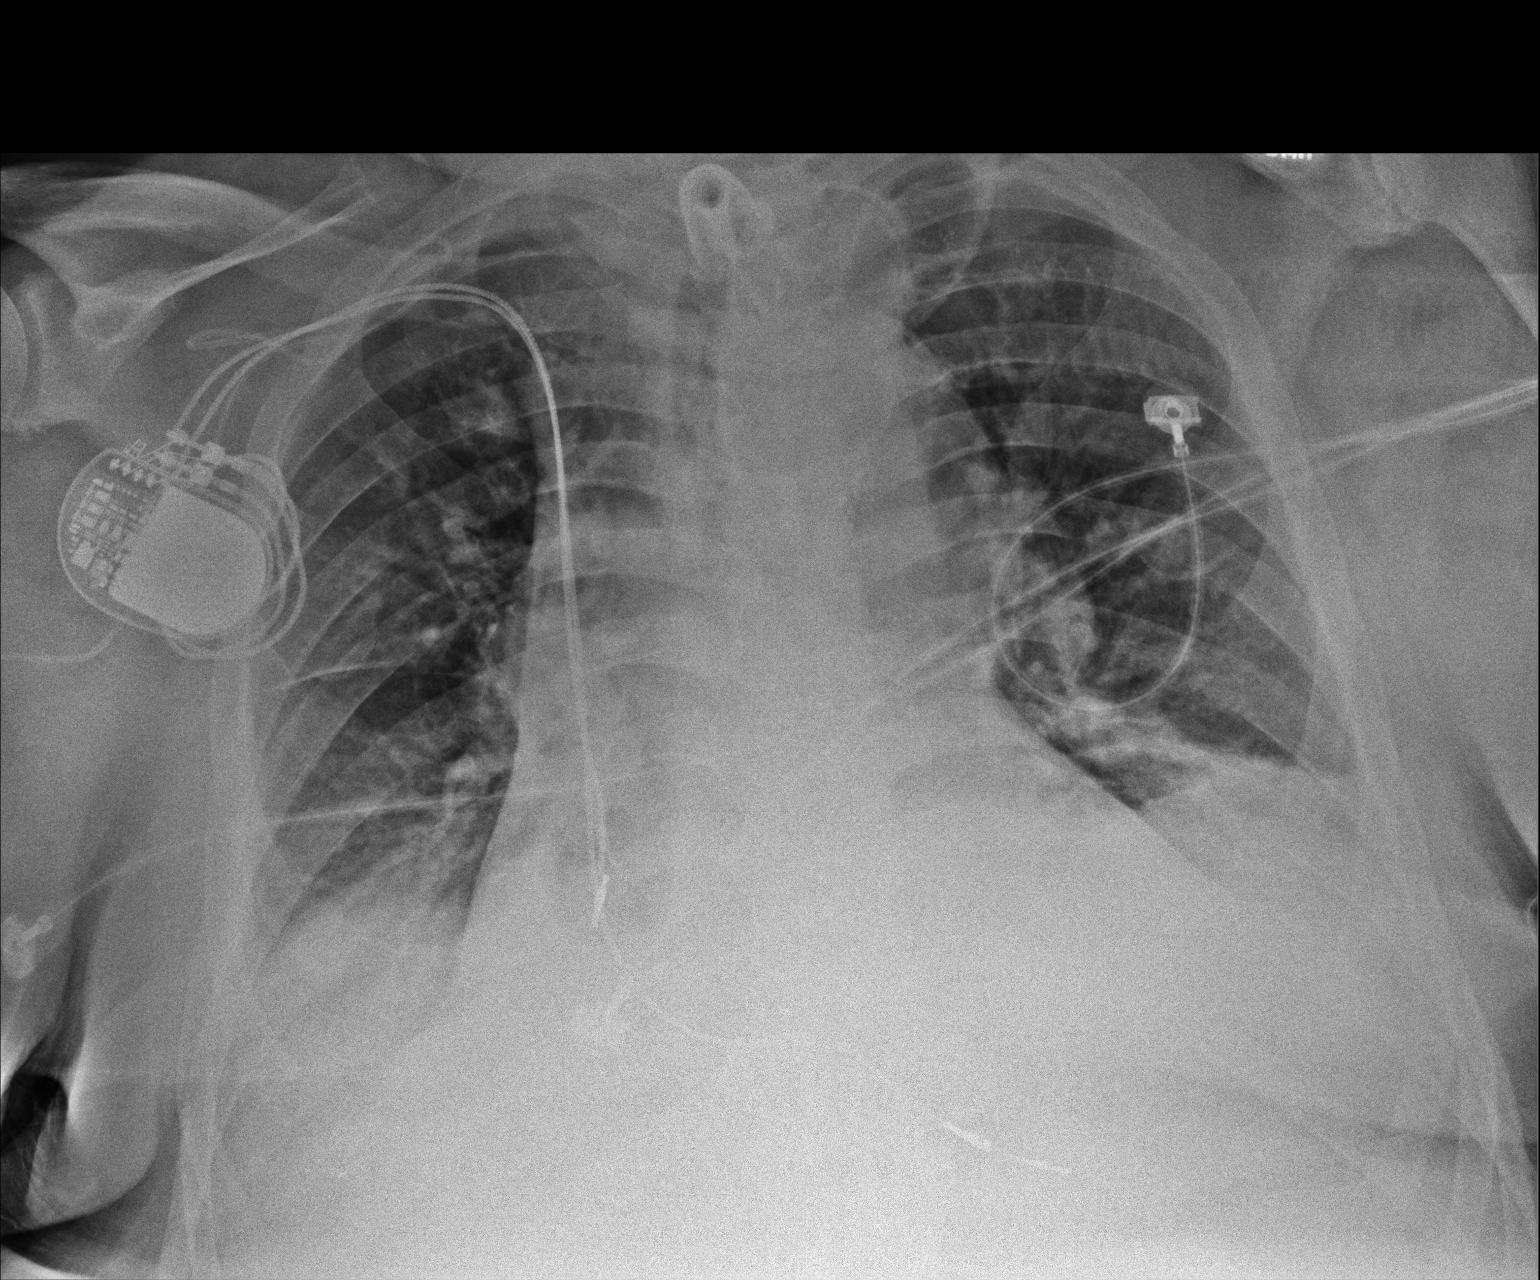

[1 of 1 positions shown; findings below may reference images not displayed]

FINDINGS: The PICC line appears to be coiled once again within the right
subclavian vein. There are adjacent leads from the patient's
permanent pacemaker. The tracheostomy appliance tip lies at the
level of the clavicular heads. The appearance of the lungs and
cardiac silhouette is unchanged.
IMPRESSION: The PICC line tip continues to be coiled in the right subclavian
vein. There are pacemaker leads which maybe impeding the passage of
the PICC line.

These results were called by telephone at the time of interpretation
on 10/18/2013 at [DATE] to Drey Jim, RN,, who verbally
acknowledged these results.

## 2016-01-30 IMAGING — DX DG CHEST 1V PORT
1 series · 1 of 1 positions shown · non-contrast
Comparison: DG CHEST 1V PORT dated 10/18/2013

CLINICAL DATA: PICC placement.

EXAM:
PORTABLE CHEST - 1 VIEW

[portable]
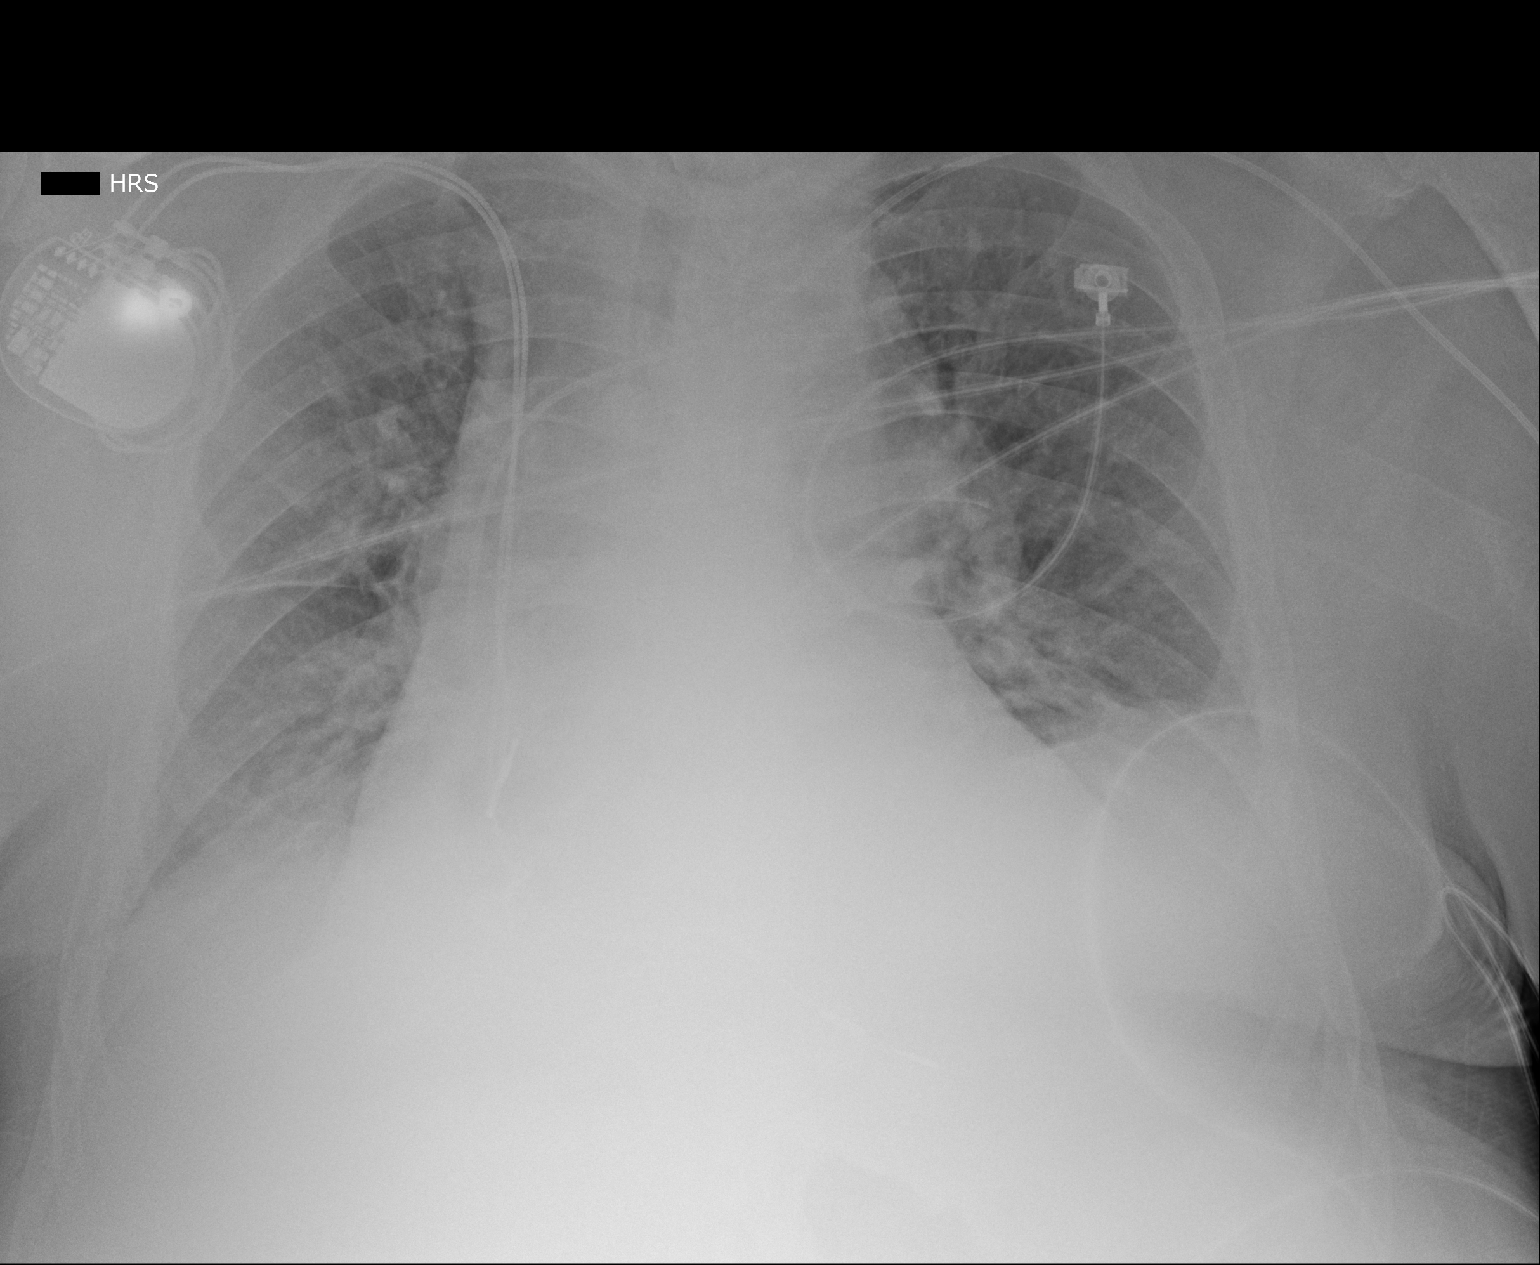

[1 of 1 positions shown; findings below may reference images not displayed]

FINDINGS: New left upper extremity PICC is present. Tracheostomy and pacemaker
apparatus appears unchanged. The tip of the left upper extremity
PICC is in the mid to lower SVC. Cardiomegaly is present with
pulmonary vascular congestion and basilar atelectasis.
IMPRESSION: New left upper extremity PICC with the tip in the mid to lower SVC.

## 2016-01-30 IMAGING — CR DG CHEST 1V PORT
1 series · 1 of 1 positions shown · non-contrast
Comparison: DG CHEST 1V PORT dated 10/18/2013

CLINICAL DATA: Right PICC line placement

EXAM:
PORTABLE CHEST - 1 VIEW

[view not recorded]
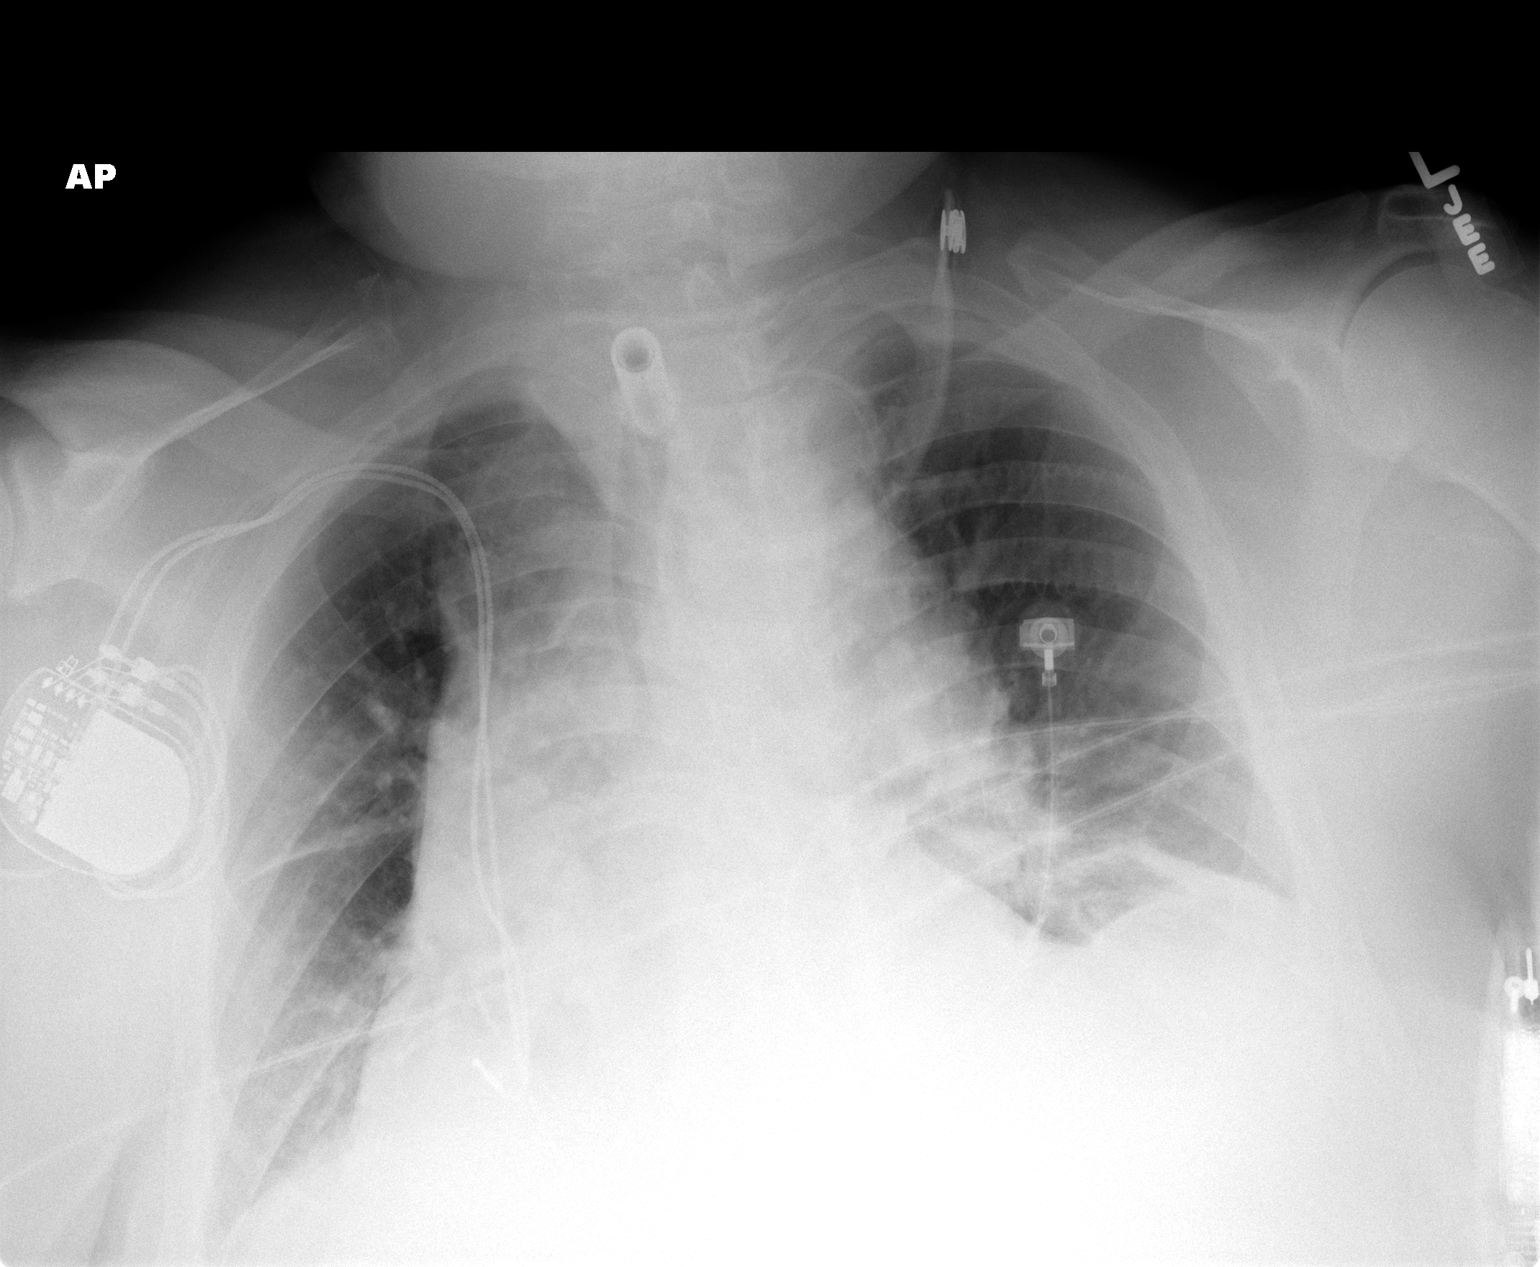

[1 of 1 positions shown; findings below may reference images not displayed]

FINDINGS: Right PICC line folds back on itself in the right subclavian vein as
it approaches the chest wall. No significant change from prior.

Tracheostomy and enlarged cardiac silhouette are similar prior
IMPRESSION: Malpositioned right PICC line folds back on itself in the right
subclavian vein. No change from prior.

## 2016-01-30 IMAGING — CR DG CHEST 1V PORT
1 series · 1 of 1 positions shown · non-contrast
Comparison: DG CHEST 1V PORT dated 10/17/2013

CLINICAL DATA: PICC line placement

EXAM:
PORTABLE CHEST - 1 VIEW

[AP]
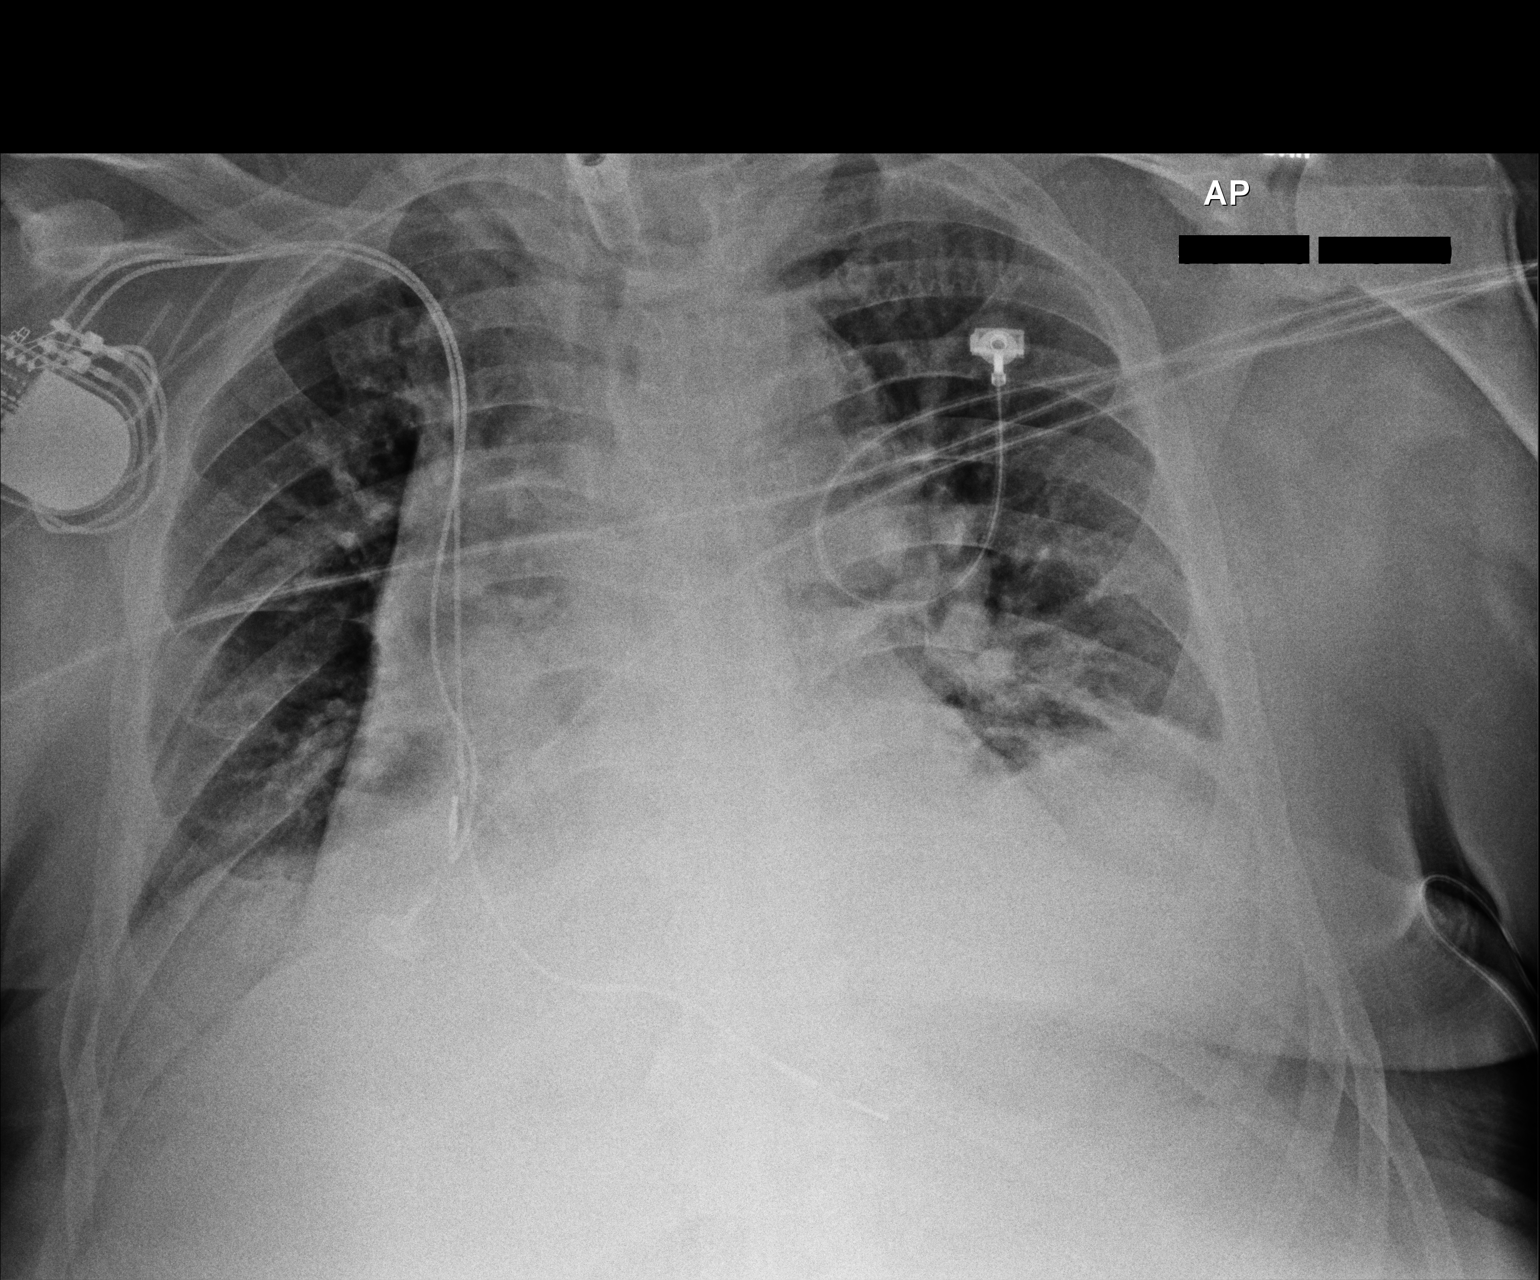

[1 of 1 positions shown; findings below may reference images not displayed]

FINDINGS: There is a right-sided PICC line coiled within the subclavian vein.
Recommend repositioning.

There is a dual lead cardiac pacer. There is tracheostomy tube in
unchanged position.

There is bilateral interstitial prominence. There is no definite
pleural effusion or pneumothorax. Stable cardiomegaly.
IMPRESSION: Right-sided PICC line coiled within the subclavian vein. Recommend
repositioning prior to use.

## 2016-01-31 IMAGING — CR DG ABD PORTABLE 1V
2 series · 2 of 2 positions shown · non-contrast
Comparison: DG ABD PORTABLE 1V dated 10/17/2013; DG ABD PORTABLE 1V
dated 10/16/2013

CLINICAL DATA: Ileus

EXAM:
PORTABLE ABDOMEN - 1 VIEW

[AP (1 of 2)]
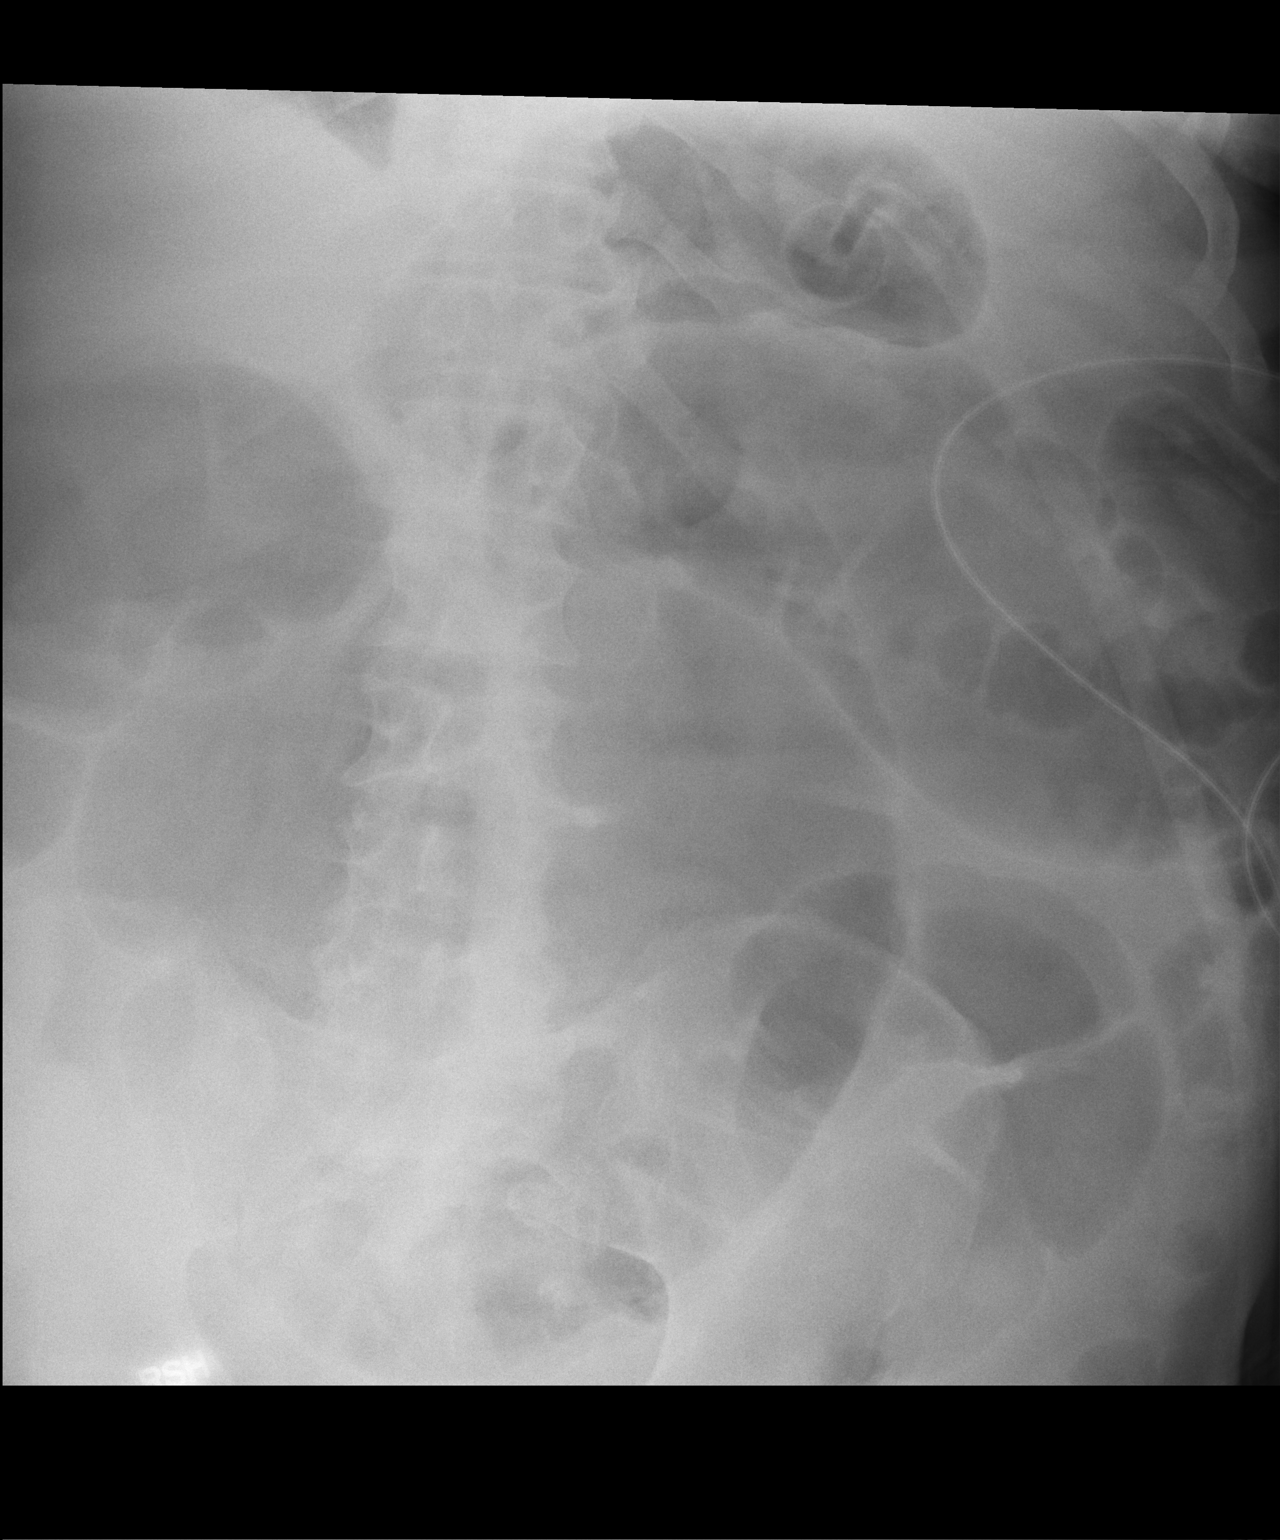

[AP (2 of 2)]
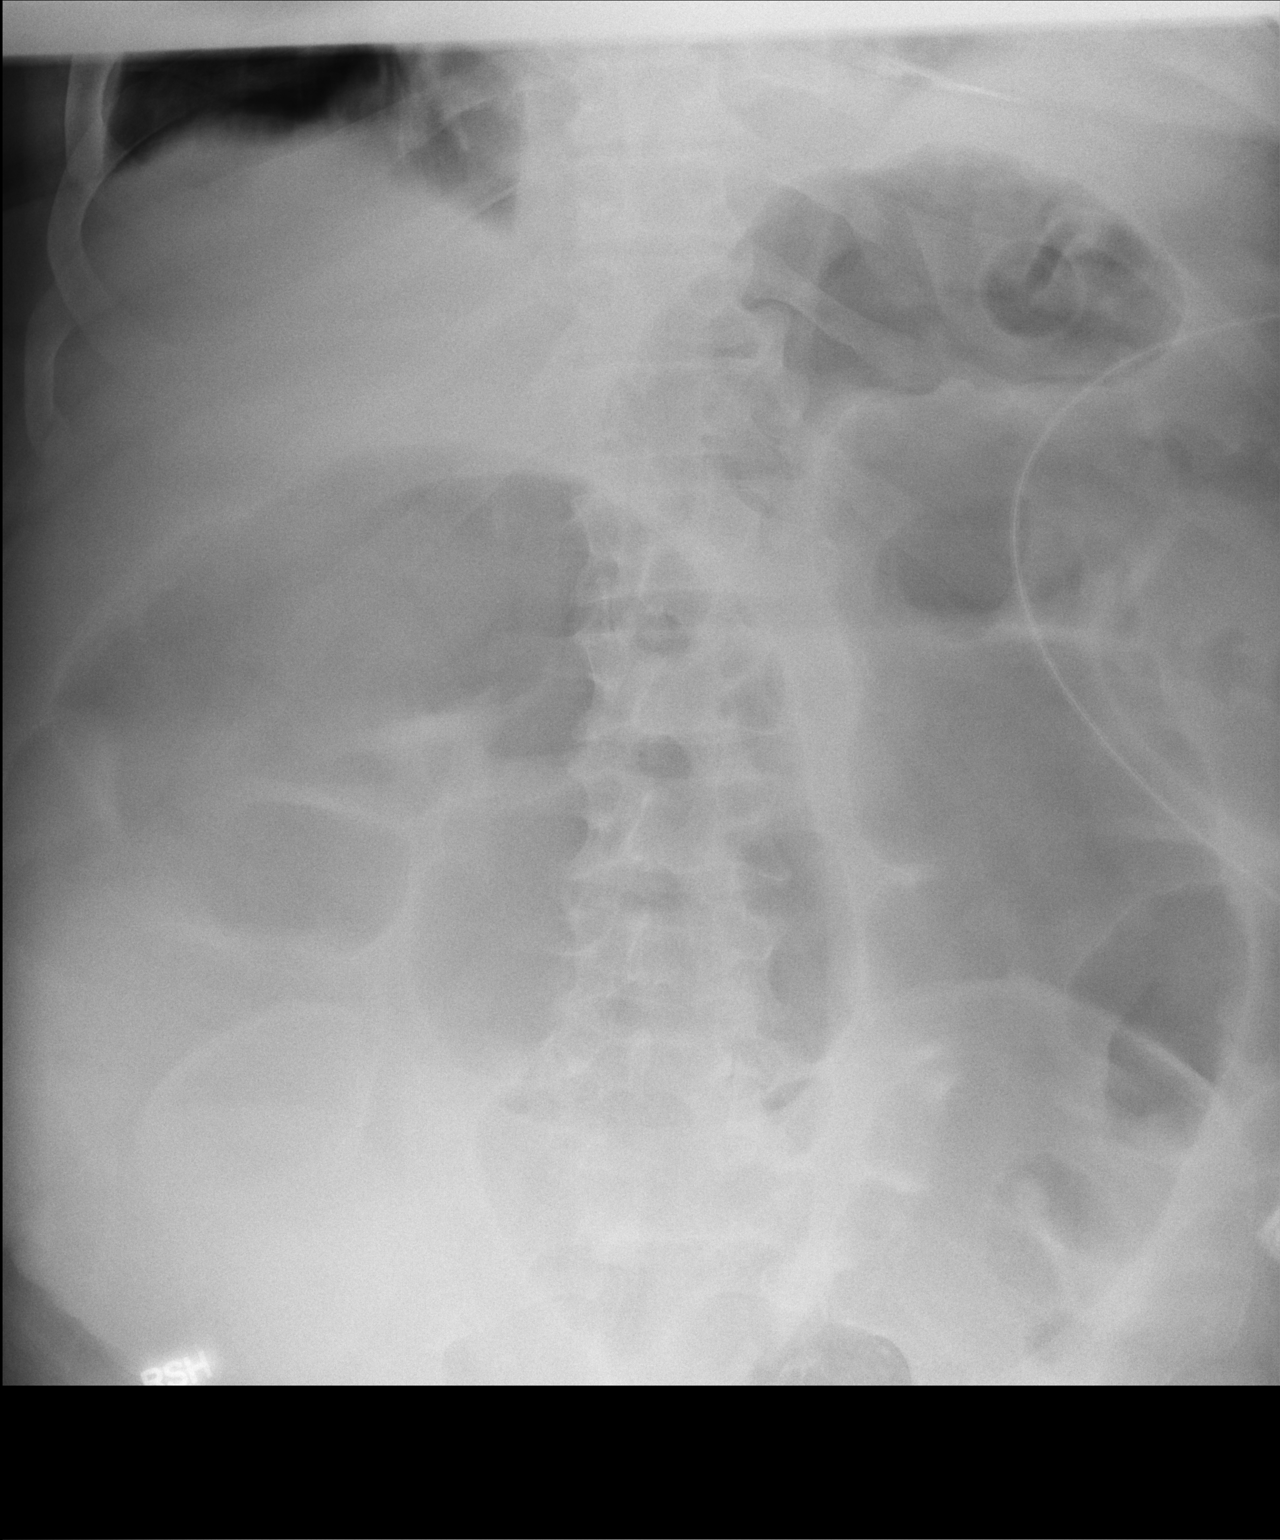

[2 of 2 positions shown; findings below may reference images not displayed]

FINDINGS: There remain loops of distended in the upper and mid abdomen. The
PEG tube is visible in the left upper quadrant and overlies the
gastric air bubble. No free extraluminal gas collections are
demonstrated.
IMPRESSION: 1. The colonic ileus is unchanged.
2. The PEG tube is present in the left upper quadrant of the
abdomen.

## 2016-02-02 IMAGING — CR DG ABD PORTABLE 1V
1 series · 1 of 1 positions shown · non-contrast
Comparison: DG ABD PORTABLE 1V dated 10/19/2013

CLINICAL DATA: Ileus.

EXAM:
PORTABLE ABDOMEN - 1 VIEW

[AP]
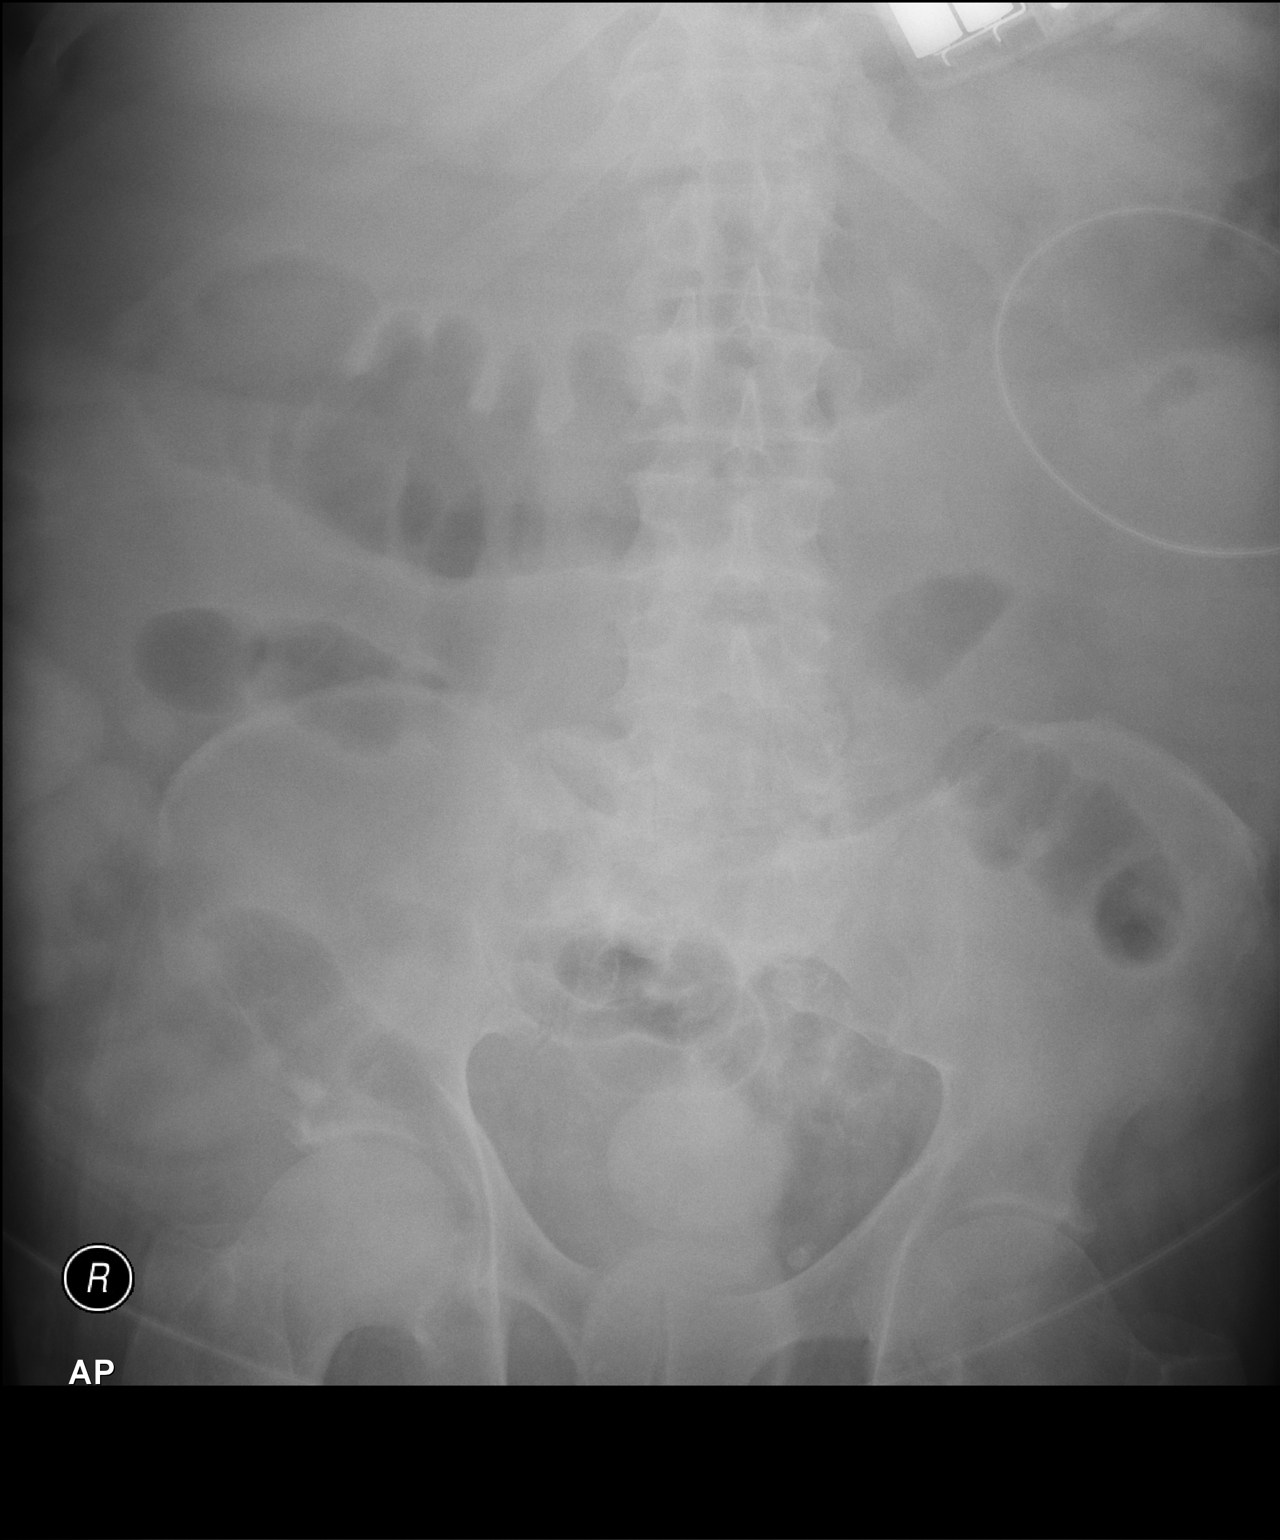

[1 of 1 positions shown; findings below may reference images not displayed]

FINDINGS: The degree of colonic distention has markedly decreased. There is
high-density material in the rectal region. A rectal balloon cannot
be excluded.
IMPRESSION: Decreased gaseous distention of the colon.

## 2016-02-04 ENCOUNTER — Ambulatory Visit (HOSPITAL_COMMUNITY)
Admission: RE | Admit: 2016-02-04 | Discharge: 2016-02-04 | Disposition: A | Discharge status: Home / Self Care | Payer: Medicare (Managed Care) | Source: Ambulatory Visit | Attending: Cardiology | Admitting: Cardiology

## 2016-02-04 VITALS — BP 128/86 | HR 80 | Wt 354.0 lb

## 2016-02-04 DIAGNOSIS — Z825 Family history of asthma and other chronic lower respiratory diseases: Secondary | ICD-10-CM | POA: Diagnosis not present

## 2016-02-04 DIAGNOSIS — I2510 Atherosclerotic heart disease of native coronary artery without angina pectoris: Secondary | ICD-10-CM | POA: Insufficient documentation

## 2016-02-04 DIAGNOSIS — I480 Paroxysmal atrial fibrillation: Secondary | ICD-10-CM | POA: Insufficient documentation

## 2016-02-04 DIAGNOSIS — N183 Chronic kidney disease, stage 3 unspecified: Secondary | ICD-10-CM

## 2016-02-04 DIAGNOSIS — E1142 Type 2 diabetes mellitus with diabetic polyneuropathy: Secondary | ICD-10-CM | POA: Diagnosis not present

## 2016-02-04 DIAGNOSIS — I350 Nonrheumatic aortic (valve) stenosis: Secondary | ICD-10-CM | POA: Insufficient documentation

## 2016-02-04 DIAGNOSIS — H3530 Unspecified macular degeneration: Secondary | ICD-10-CM | POA: Diagnosis not present

## 2016-02-04 DIAGNOSIS — E1122 Type 2 diabetes mellitus with diabetic chronic kidney disease: Secondary | ICD-10-CM | POA: Diagnosis not present

## 2016-02-04 DIAGNOSIS — I130 Hypertensive heart and chronic kidney disease with heart failure and stage 1 through stage 4 chronic kidney disease, or unspecified chronic kidney disease: Secondary | ICD-10-CM | POA: Insufficient documentation

## 2016-02-04 DIAGNOSIS — Z794 Long term (current) use of insulin: Secondary | ICD-10-CM | POA: Diagnosis not present

## 2016-02-04 DIAGNOSIS — Z89432 Acquired absence of left foot: Secondary | ICD-10-CM | POA: Diagnosis not present

## 2016-02-04 DIAGNOSIS — Z79899 Other long term (current) drug therapy: Secondary | ICD-10-CM | POA: Diagnosis not present

## 2016-02-04 DIAGNOSIS — H548 Legal blindness, as defined in USA: Secondary | ICD-10-CM | POA: Insufficient documentation

## 2016-02-04 DIAGNOSIS — Z7982 Long term (current) use of aspirin: Secondary | ICD-10-CM | POA: Diagnosis not present

## 2016-02-04 DIAGNOSIS — G4733 Obstructive sleep apnea (adult) (pediatric): Secondary | ICD-10-CM | POA: Insufficient documentation

## 2016-02-04 DIAGNOSIS — Z6841 Body Mass Index (BMI) 40.0 and over, adult: Secondary | ICD-10-CM | POA: Diagnosis not present

## 2016-02-04 DIAGNOSIS — I5032 Chronic diastolic (congestive) heart failure: Secondary | ICD-10-CM | POA: Insufficient documentation

## 2016-02-04 DIAGNOSIS — Z87891 Personal history of nicotine dependence: Secondary | ICD-10-CM | POA: Insufficient documentation

## 2016-02-04 DIAGNOSIS — Z950 Presence of cardiac pacemaker: Secondary | ICD-10-CM | POA: Diagnosis not present

## 2016-02-04 DIAGNOSIS — Z8249 Family history of ischemic heart disease and other diseases of the circulatory system: Secondary | ICD-10-CM | POA: Diagnosis not present

## 2016-02-04 LAB — BASIC METABOLIC PANEL
Anion gap: 10 (ref 5–15)
BUN: 48 mg/dL — AB (ref 6–20)
CHLORIDE: 95 mmol/L — AB (ref 101–111)
CO2: 31 mmol/L (ref 22–32)
CREATININE: 2.36 mg/dL — AB (ref 0.61–1.24)
Calcium: 9.3 mg/dL (ref 8.9–10.3)
GFR calc non Af Amer: 27 mL/min — ABNORMAL LOW (ref >60)
GFR, EST AFRICAN AMERICAN: 31 mL/min — AB (ref >60)
Glucose, Bld: 172 mg/dL — ABNORMAL HIGH (ref 65–99)
POTASSIUM: 3.7 mmol/L (ref 3.5–5.1)
SODIUM: 136 mmol/L (ref 135–145)

## 2016-02-04 IMAGING — CR DG CHEST 1V PORT
1 series · 1 of 1 positions shown · non-contrast
Comparison: none

[AP]
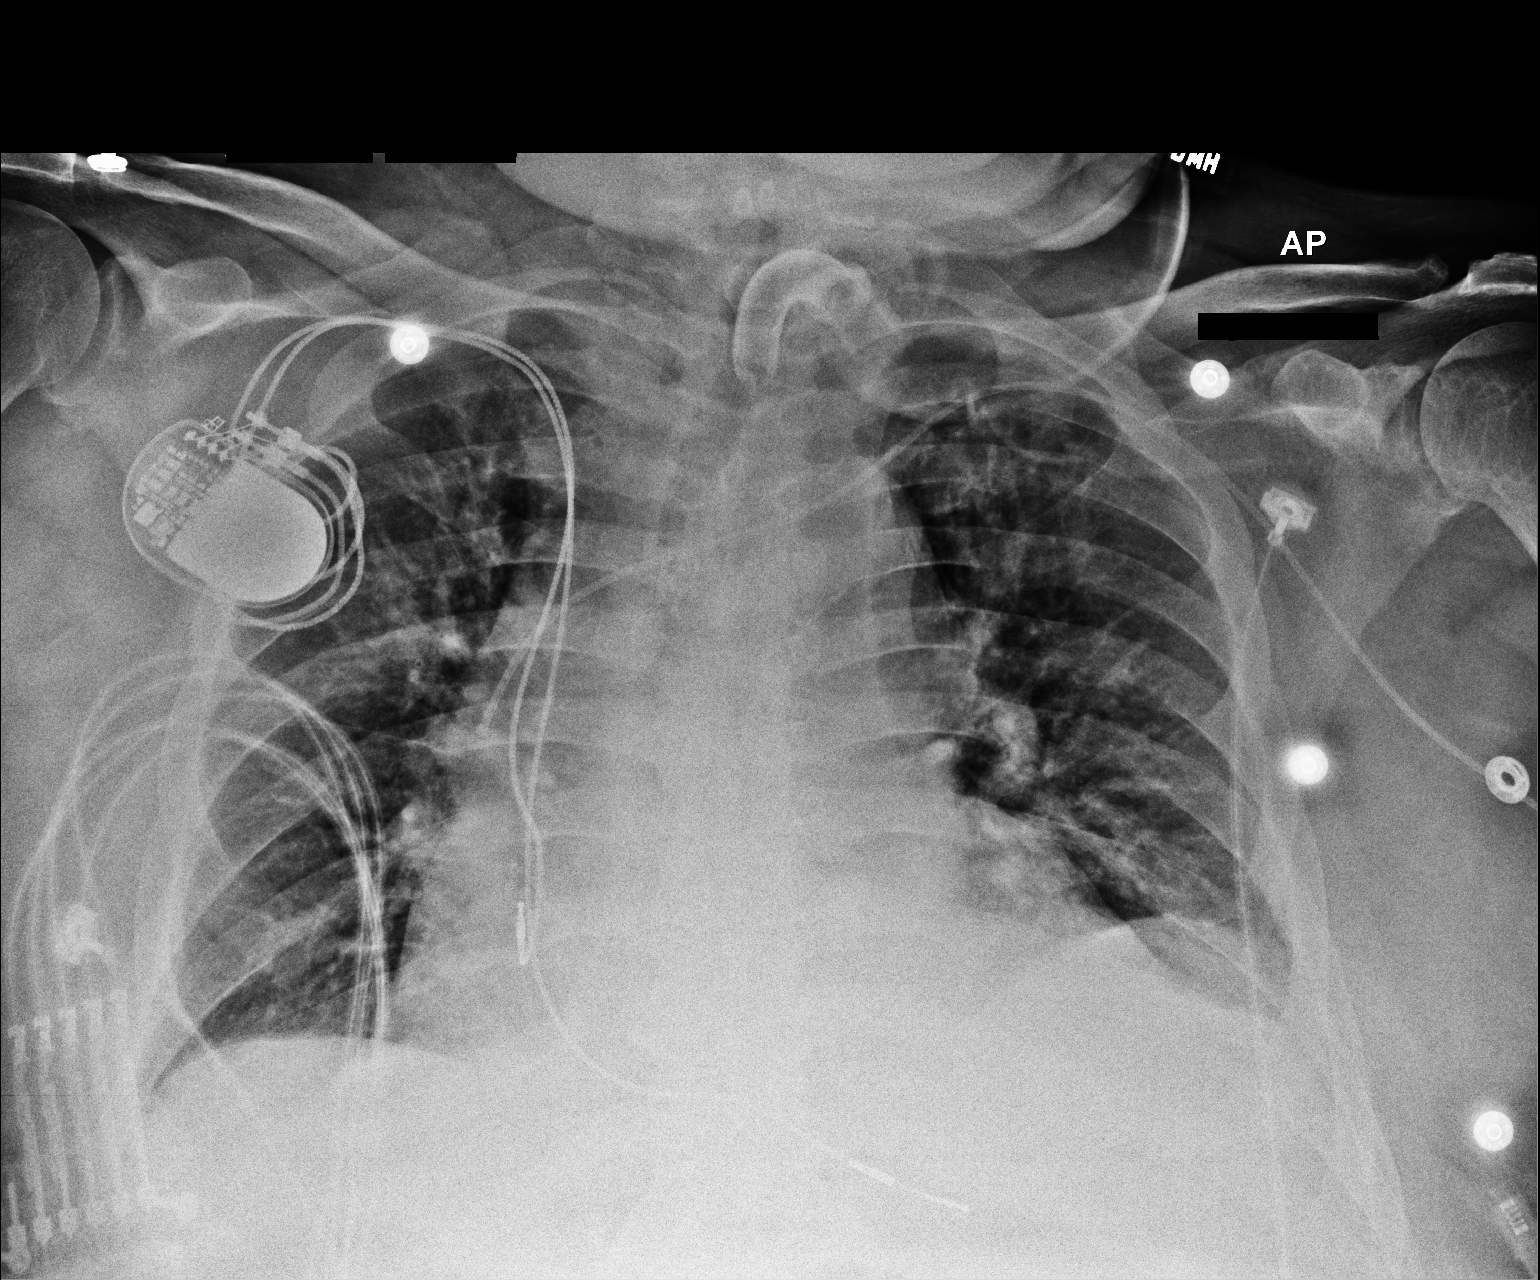

[1 of 1 positions shown; findings below may reference images not displayed]

CLINICAL DATA
Status post PICC line placement

EXAM
PORTABLE CHEST - 1 VIEW

COMPARISON
DG CHEST 1V PORT dated 10/18/2013

FINDINGS
A PICC line is in place via the left upper extremity. The tip of the
catheter lies in the region of the junction of the proximal and
midportions of the SVC. This is somewhat more proximally positioned
than on the previous study.

The tracheostomy appliance tip lies at the level of the clavicular
heads approximately 6.8 cm above the crotch of the carina. The
permanent pacemaker is unchanged in appearance.

The cardiac silhouette remains enlarged. The pulmonary vascularity
is engorged. The lungs remain borderline hypoinflated. No alveolar
pneumonia is demonstrated.

IMPRESSION
The PICC line tip lies in the region of the junction of the proximal
and midportions of the SVC.

These results were called by telephone at the time of interpretation
on 10/23/2013 at [DATE] to Fazal Mcphail, RN,, who verbally
acknowledged these results.

SIGNATURE

## 2016-02-04 NOTE — Progress Notes (Signed)
Patient ID: Skippy Rohn, male   DOB: August 29, 1945, 70 y.o.   MRN: [id number]   PCP: [name] Cardiology: Dr. Sallyanne Kuster HF Cardiology: Dr. Aundra Dubin  70 yo medically complicated man with chronic diastolic CHF complicated by RV dysfunction, morbid obesity, CKD stage III, symptomatic sinus bradycardia s/p PPM and blindness.   He returns for HF follow up. Earlier this week he established with Dr Joelyn Oms. Overall feeling ok. Denies SOB/PND/Orthopnea. Does admit to mild dyspnea after he gets dressed. Requires rest breaks when he walks. Wears CPAP nightly.  Ambulates with a rolling walker. Says he was able to walk without difficulty today.  [facility] are provided by [facility] every Saturday in a blister pack. [facility] att[facility].  Lives at home alone but has friends that check on him. He has an Aide 3 times a week.   Labs (11/16): K 3.8, creatinine 2.24 Labs (07/28/2015): K 3.5 Creatinine 3.41  Labs (08/19/2015): K 4.3 Creatinine 1.67  Labs (08/29/2015: K 4.1 Creatinine 2.01 Labs (1//18/2017): K 3.7 Creatinine 2.36 Labs (09/16/2015) : K 3.6 Creatinine 2.06   Labs (10/08/2015): K 3.7 Creatinine 1.95  Labs (10/27/2015): K 3.0 Creatinine 2.61  PMH: 1. Chronic diastolic CHF with RV dysfunction: Echo (2/15) with EF 60%, mild LVH, mild aortic stenosis with mean gradient 18 mmHg, RV mildly dilated with mildly decreased systolic function.  2. Aortic stenosis: Mild.  3. Type II diabetes 4. LBBB 5. OSA: Uses CPAP.  6. Morbid obesity.  7. HTN 8. CAD: Nonobstructive.  [place] 2005 with 60% LAD stenosis. 9. 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  10. Atrial fibrillation: Paroxysmal.  None recently.   11. CKD stage III 12. Macular degeneration: Legally blind.  13. 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  14. 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 15. S/p left foot amputation 16. Diabetic peripheral neuropathy.  17. ECHO EF 55-60%. Mild-mod AS  Social History    Social History  . Marital Status: Single    Spouse Name: N/A  . Number of Children: Y  . Years of Education: N/A   Occupational History  . disabled    Social History Main Topics  . Smoking status: Former Smoker -- 0.00 packs/day for 0 years    Types: Cigarettes    Quit date: 08/17/2003  . Smokeless tobacco: Never Used     Comment: started at age 46.  only smoked on occ- very rarely.0  . Alcohol Use: Yes     Comment: 06/23/2013 "drank some; never had a problem w/it"  . Drug Use: No  . Sexual Activity: No   Other Topics Concern  . Lives with roommate.   Family History  Problem Relation Age of Onset  . Coronary artery disease Sister   . Heart disease Sister   . Allergies Sister   . Asthma Brother   . Rheum arthritis Brother    ROS: All systems reviewed and negative except as per HPI.  Current Outpatient Prescriptions  Medication Sig Dispense Refill  . acetaminophen (TYLENOL) 650 MG CR tablet Take 650 mg by mouth 2 (two) times daily.    Marland Kitchen albuterol (PROVENTIL HFA;VENTOLIN HFA) 108 (90 Base) MCG/ACT inhaler Inhale 2 puffs into the lungs every 6 (six) hours as needed for wheezing or shortness of breath.    . allopurinol (ZYLOPRIM) 100 MG tablet Take 100 mg by mouth daily. Reported on 12/09/2015    . amiodarone (PACERONE) 200 MG tablet Take 200 mg by mouth daily. Reported on 12/09/2015    .  amLODipine (NORVASC) 10 MG tablet Take 10 mg by mouth daily. Reported on 12/09/2015    . aspirin 325 MG tablet Take 325 mg by mouth daily.    Marland Kitchen atorvastatin (LIPITOR) 40 MG tablet Take 1 tablet (40 mg total) by mouth daily. 90 tablet 3  . Cholecalciferol (VITAMIN D-3) 1000 UNITS CAPS Take 1 capsule by mouth daily.    Marland Kitchen gabapentin (NEURONTIN) 300 MG capsule Take 300 mg by mouth 2 (two) times daily.    . hydrALAZINE (APRESOLINE) 25 MG tablet Take 25 mg by mouth 3 (three) times daily.    . Insulin Glargine (TOUJEO SOLOSTAR) 300 UNIT/ML SOPN Inject 120 Units into the skin 2 (two) times daily.     Marland Kitchen lidocaine (LIDODERM) 5 % Place 1 patch onto the skin daily as needed (back pain). Remove & Discard patch within 12 hours or as directed by MD    . Liraglutide (VICTOZA) 18 MG/3ML SOPN Inject 1.8 mg into the skin daily.    . Melatonin 3 MG CAPS Take 1 capsule by mouth at bedtime as needed (sleep).     . Menthol, Topical Analgesic, (BIOFREEZE EX) Apply 1 application topically daily as needed (pain). Reported on 10/27/2015    . methylcellulose (ARTIFICIAL TEARS) 1 % ophthalmic solution Place 1 drop into both eyes 2 (two) times daily.    . metolazone (ZAROXOLYN) 2.5 MG tablet Take 2.5 mg by mouth as directed. 1 tab twice weekly on Monday and Thursdays    . metoprolol succinate (TOPROL-XL) 100 MG 24 hr tablet Take 100 mg by mouth daily. Take with or immediately following a meal.    . pantoprazole (PROTONIX) 40 MG tablet Take 40 mg by mouth daily.    . potassium chloride SA (K-DUR,KLOR-CON) 20 MEQ tablet Take 20 mEq by mouth as directed. Take 2 tab (86mEq) every morning and 1 tab (20 mEq) every evening    . sennosides-docusate sodium (SENOKOT-S) 8.6-50 MG tablet Take 1 tablet by mouth daily as needed for constipation.    . Skin Protectants, Misc. (EUCERIN) cream Apply 1 application topically 2 (two) times daily.    Marland Kitchen torsemide (DEMADEX) 100 MG tablet Take 1 tablet (100 mg total) by mouth 2 (two) times daily. 60 tablet 6   No current facility-administered medications for this encounter.   BP 128/86 mmHg  Pulse 80  Wt 354 lb (160.573 kg)  SpO2 92% General: NAD, morbidly obese. Arrived in a wheel chair.  No difficulty.  Neck: Very thick, JVP difficult, no thyromegaly or thyroid nodule.  Lungs:Clear .   CV: Nondisplaced PMI.  Heart regular S1/S2, no S3/S4, 2/6 early SEM RUSB with clear S2.  No carotid bruit. Abdomen: Obese, Soft, nontender, no hepatosplenomegaly, mild distention.  Skin: Intact without lesions or rashes.  Neurologic: Alert and oriented x 3.  Psych: Normal affect. Extremities: No  clubbing or cyanosis. Trace lower extremity edema.     HEENT: Normal.   Assessment/Plan: 1. Chronic diastolic CHF with prominent RV failure: NYHA class III symptoms.  Functionally seems to be stable. Volume status stable.  Continue torsemide 100 mg bid.  Continue metolazone twice a week.    2. Aortic stenosis: Mild-Mod on ECHO March 2017.     3. Atrial fibrillation: Paroxysmal.  Last episode was in 10/14 based on device interrogation.  He is not anticoagulated (takes ASA) due to prior intracerebral hemorrhage. Regular pulse today.  4. CAD: Nonobstructive.  No chest pain.  He is on ASA and statin.  5. Bradycardia s/p Medtronic  PPM  6. CKD: Stage III. Nephrology following. BMET today.      7. OSA: Continue CPAP nightly.  Follow up in 3 months with Dr Aundra Dubin. BMET today.    Evangelene Vora NP-C  02/04/2016

## 2016-02-04 NOTE — Progress Notes (Signed)
Advanced Heart Failure Medication Review by a Pharmacist   Understanding of regimen: Not assessed Understanding of indications: Not Assessed Potential of compliance: Not assessed Patient understands to avoid NSAIDs. Patient understands to avoid decongestants.  Pharmacist comments: Mr. Gilpin is a 14 yom presenting to clinic for a follow-up appointment. Due to his blindness, his medications are managed by [facility]. Patient brought medication list with him today from [facility]. Medication regimen was reviewed and no discrepancies were identified other than a change in insulin from lantus solostar pens to [employer].   Antolin Belsito C. Lennox Grumbles, PharmD Pharmacy Resident  Pager: [phone] 02/04/2016 10:12 AM   Time with patient: 0 min  Preparation and documentation time: 10 min  Total time: 10 min

## 2016-02-04 NOTE — Patient Instructions (Signed)
Labs today  Your physician recommends that you schedule a follow-up appointment in: 3 months with Dr Aundra Dubin  Do the following things EVERYDAY: 1) Weigh yourself in the morning before breakfast. Write it down and keep it in a log. 2) Take your medicines as prescribed 3) Eat low salt foods-Limit salt (sodium) to 2000 mg per day.  4) Stay as active as you can everyday 5) Limit all fluids for the day to less than 2 liters 6)

## 2016-02-05 IMAGING — CT CT HEAD W/O CM
1 series · 15 of 30 positions shown, 19 images · non-contrast
Comparison: none

[Series 2: head 5.0 h30s · axial · 0.48mm/px · z∈[+1356,+1491]mm · 15 of 31 slices shown, 19 images]
[im 2/31  brain]
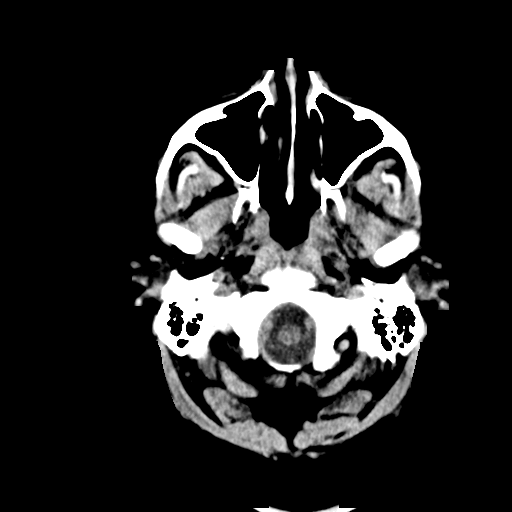
[im 2/31  bone]
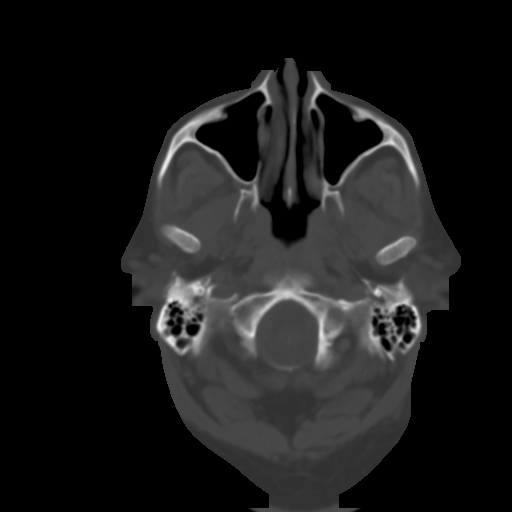
[im 4/31  brain]
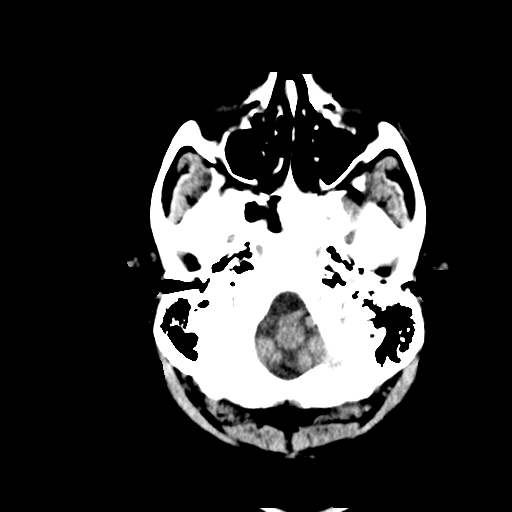
[im 6/31  brain]
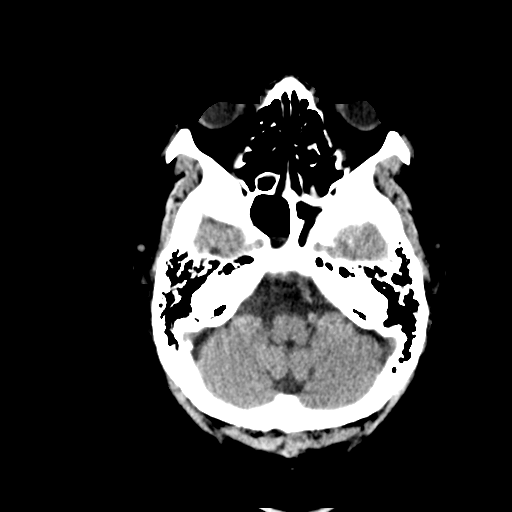
[im 8/31  brain]
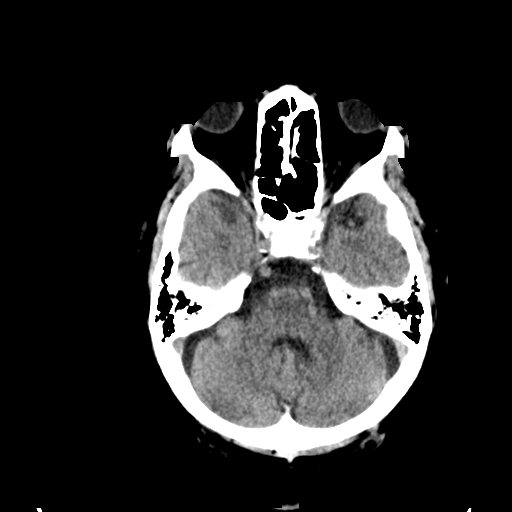
[im 10/31  brain]
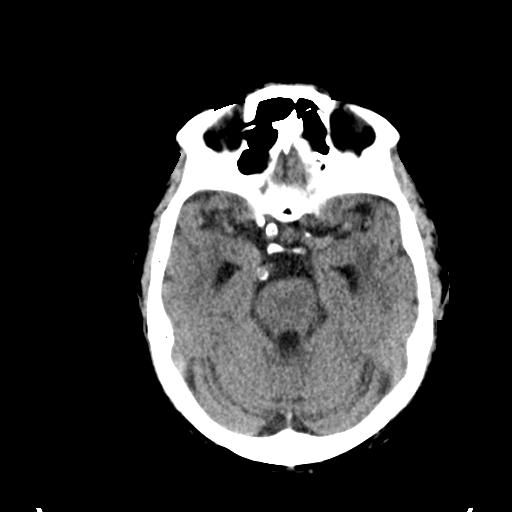
[im 10/31  bone]
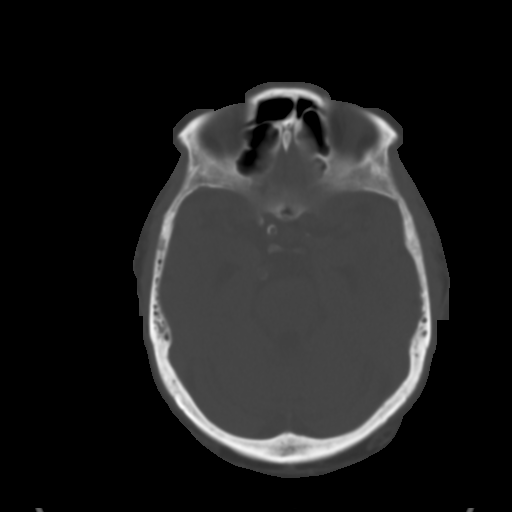
[im 12/31  brain]
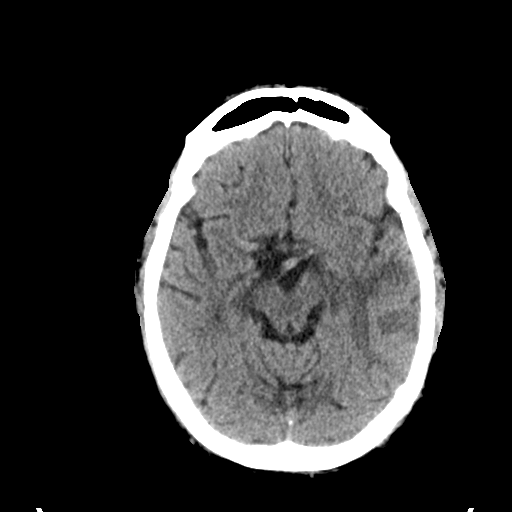
[im 14/31  brain]
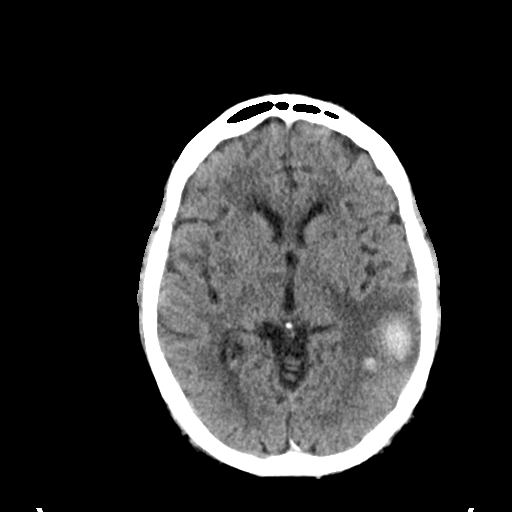
[im 16/31  brain]
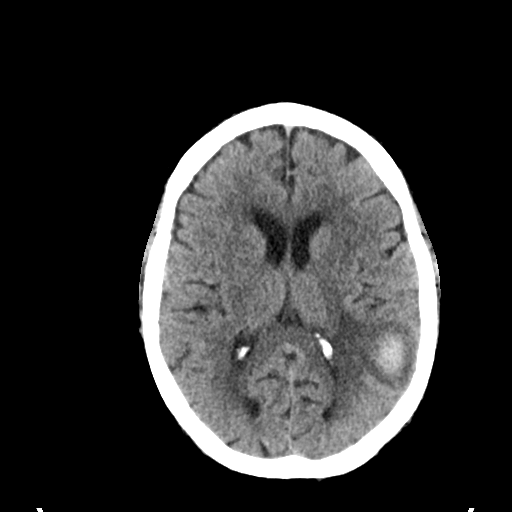
[im 17/31  brain]
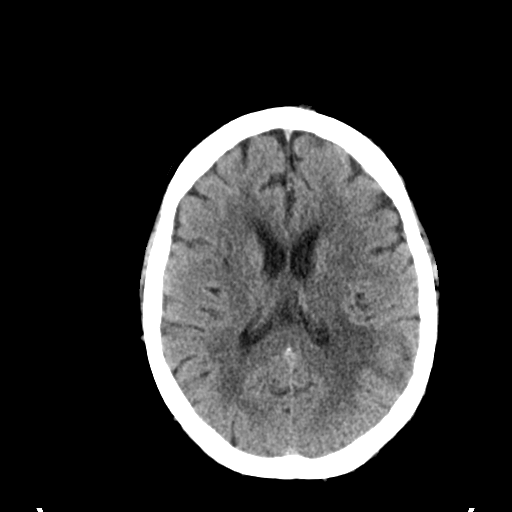
[im 17/31  bone]
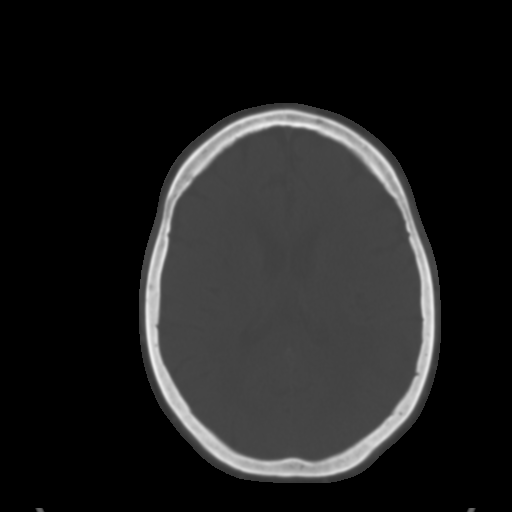
[im 19/31  brain]
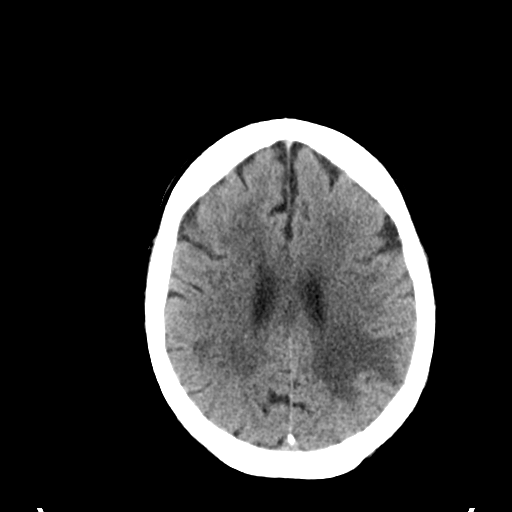
[im 21/31  brain]
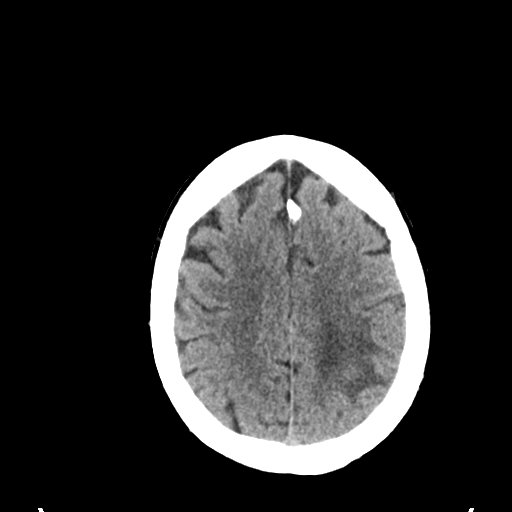
[im 23/31  brain]
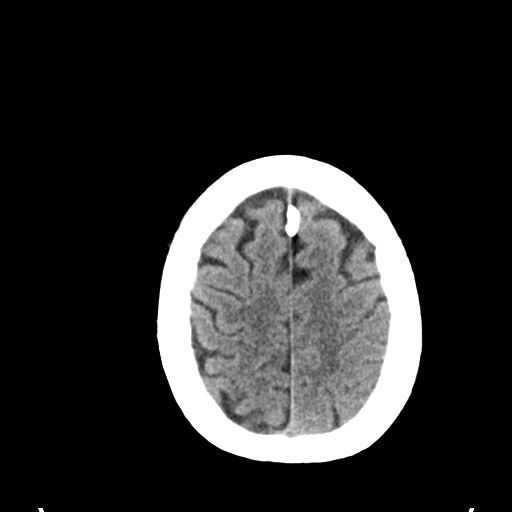
[im 25/31  brain]
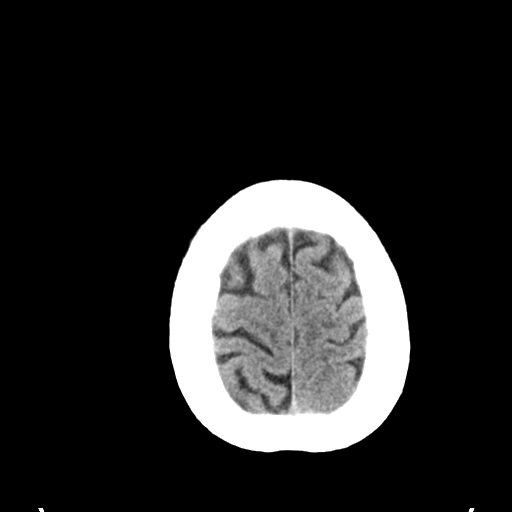
[im 25/31  bone]
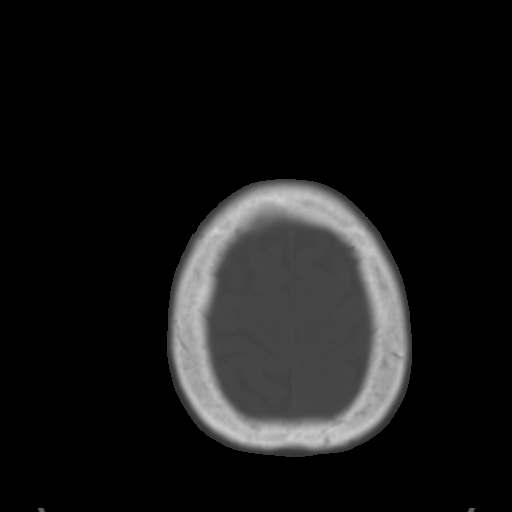
[im 27/31  brain]
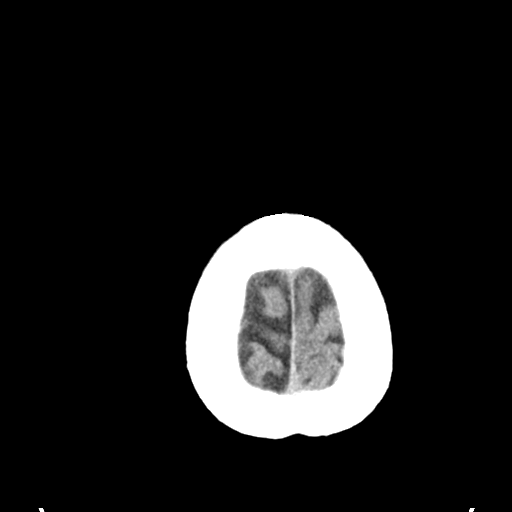
[im 29/31  brain]
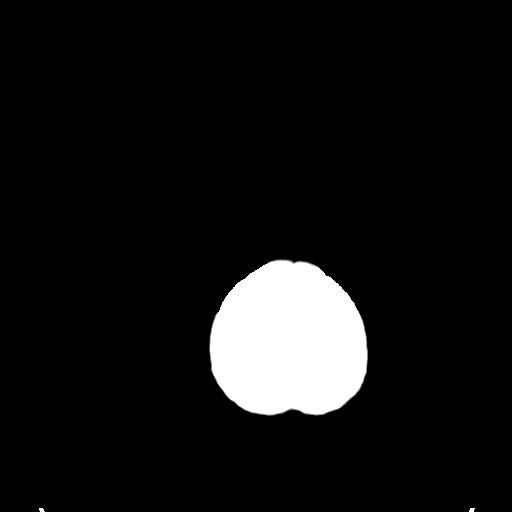

[15 of 30 positions shown; findings below may reference images not displayed]

CLINICAL DATA
Intracranial hemorrhage.

EXAM
CT HEAD WITHOUT CONTRAST

TECHNIQUE
Contiguous axial images were obtained from the base of the skull
through the vertex without intravenous contrast.

COMPARISON
CT HEAD W/O CM dated 10/14/2013; CT HEAD W/O CM dated 10/12/2013; CT
HEAD W/O CM dated 10/08/2013; CT HEAD W/O CM dated 10/01/2013

FINDINGS
Evolving posterior left temporal lobe hemorrhage is present. There
is a small focus that now appears distinct along the posterior
medial margin of the parenchymal hematoma. Looking over the prior
examinations, this has likely been present and is now more obvious
because of resorption along the larger parenchymal hematoma. There
is no midline shift. Underlying chronic ischemic white matter
disease and atrophy. Intracranial atherosclerosis. Paranasal sinuses
show mucosal thickening. Bilateral lens extractions.

IMPRESSION
Evolving posterior left temporal lobe hematoma. Tiny focus of
hemorrhage posterior medial to the dominant focus is probably more
obvious now because of evolution/ resorbtion of parenchymal blood
nearby.

SIGNATURE

## 2016-02-06 IMAGING — CR DG CHEST 1V PORT
1 series · 1 of 1 positions shown · non-contrast
Comparison: none

[AP]
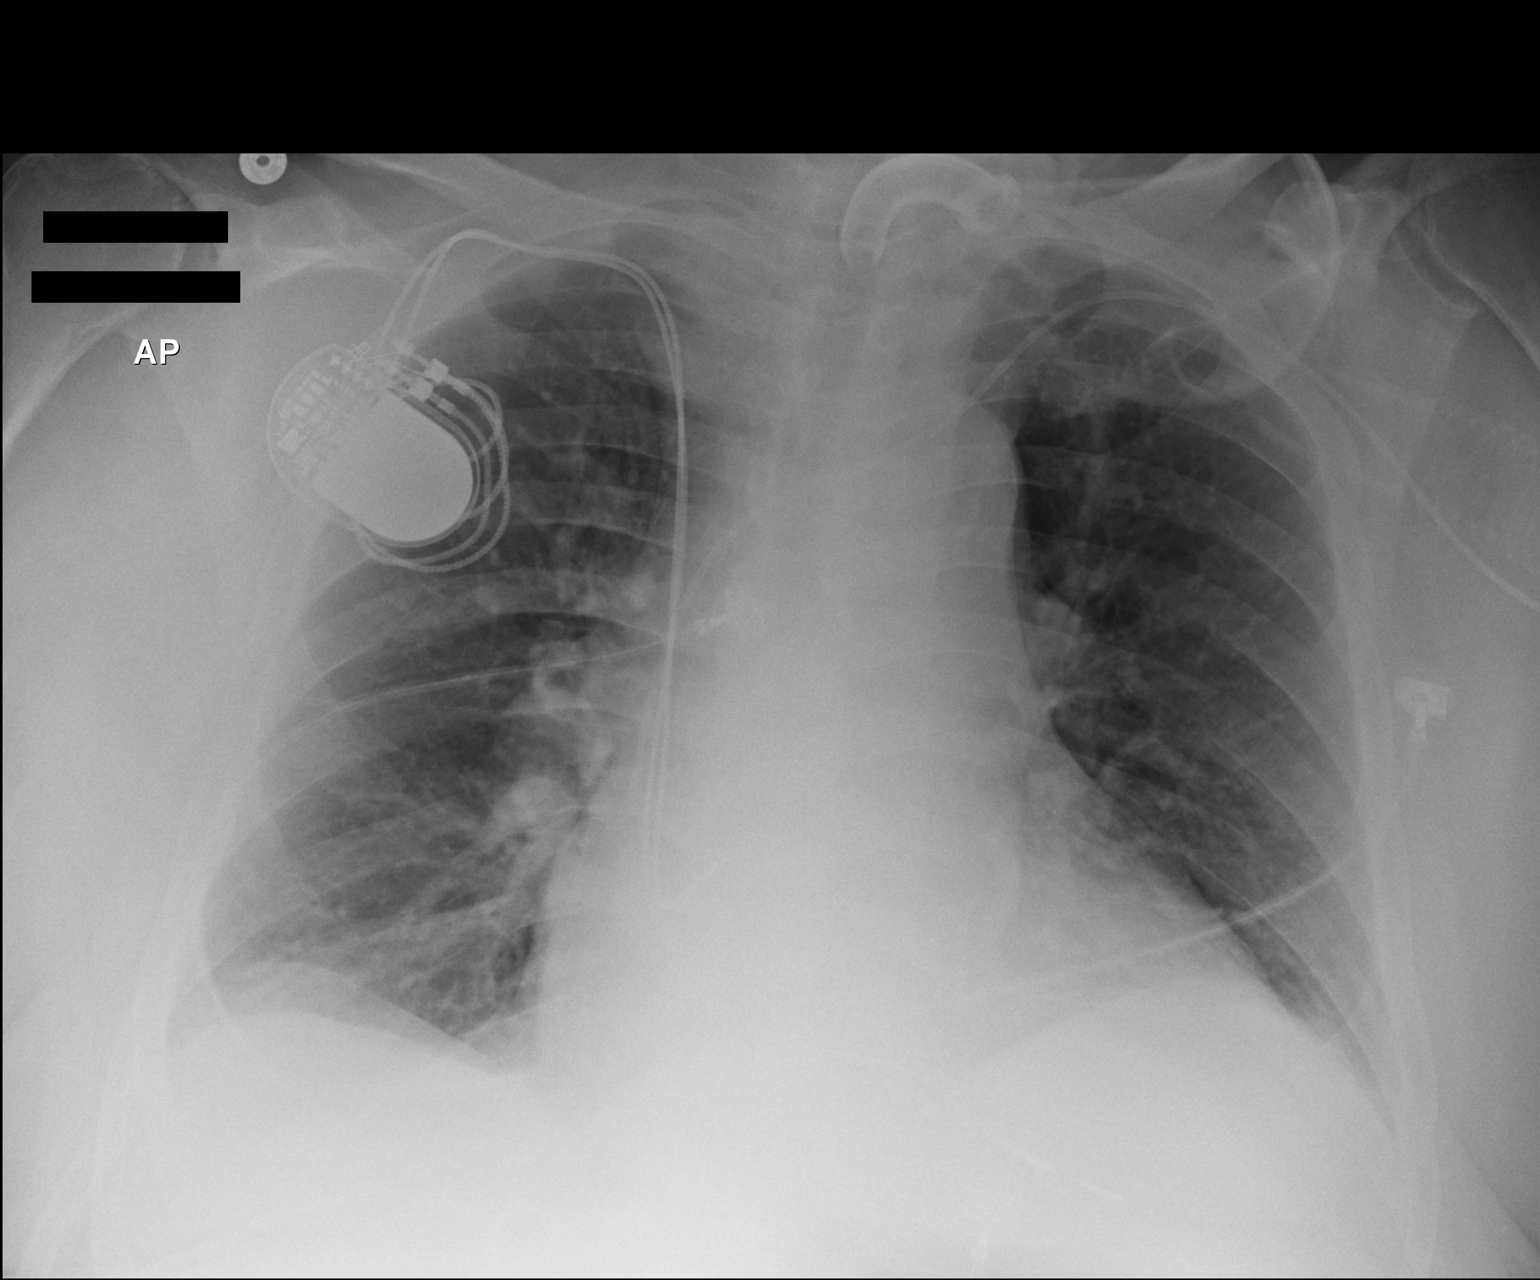

[1 of 1 positions shown; findings below may reference images not displayed]

CLINICAL DATA
Increased secretions

EXAM
PORTABLE CHEST - 1 VIEW

COMPARISON
Portable chest x-ray of 10/23/2013

FINDINGS
Aeration of the lungs has improved. Only mild basilar volume loss
remains. Tracheostomy is unchanged in position. A left central
venous line tip is seen to the expected SVC RA junction and
permanent pacemaker remains. Cardiomegaly is stable.

IMPRESSION
1. Slightly better aeration.
2. Stable cardiomegaly with tracheostomy, left central venous line,
and pacemaker.

SIGNATURE

## 2016-02-06 IMAGING — CR DG ABD PORTABLE 1V
1 series · 1 of 1 positions shown · non-contrast
Comparison: Prior abdominal radiograph 10/21/2013

CLINICAL DATA: Ileus

EXAM:
PORTABLE ABDOMEN - 1 VIEW

[AP]
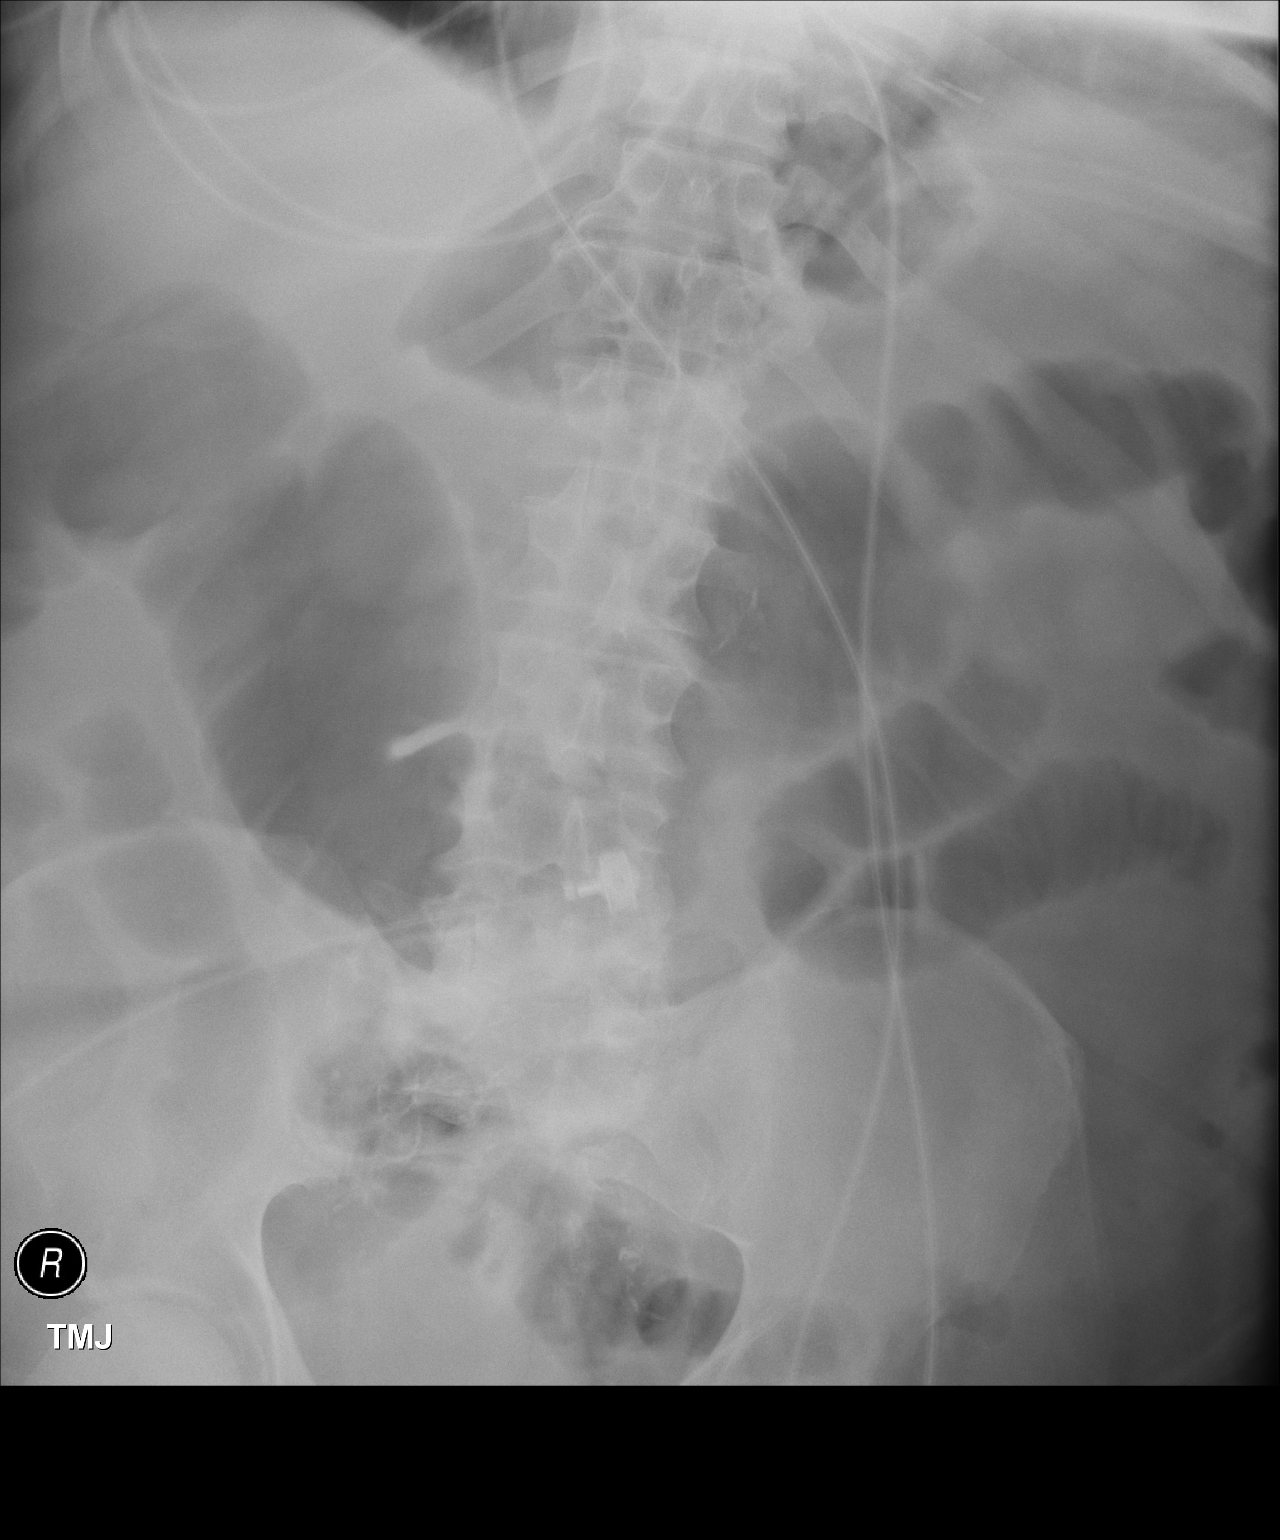

[1 of 1 positions shown; findings below may reference images not displayed]

FINDINGS: Persistent gas-filled and borderline dilated loops of large and
small bowel in the mid abdomen. No evidence of overt obstruction. No
large free air on this single supine radiograph. Vascular
phleboliths project over the anatomic pelvis. No acute osseous
abnormality.
IMPRESSION: Persistent mild gaseous distension of large and small bowel as can
be seen with ileus.

## 2016-02-10 IMAGING — CR DG ABD PORTABLE 1V
1 series · 1 of 1 positions shown · non-contrast
Comparison: DG ABD PORTABLE 1V dated 10/25/2013; DG ABD PORTABLE 1V
dated 10/21/2013

CLINICAL DATA: Ileus.

EXAM:
PORTABLE ABDOMEN - 1 VIEW

[AP]
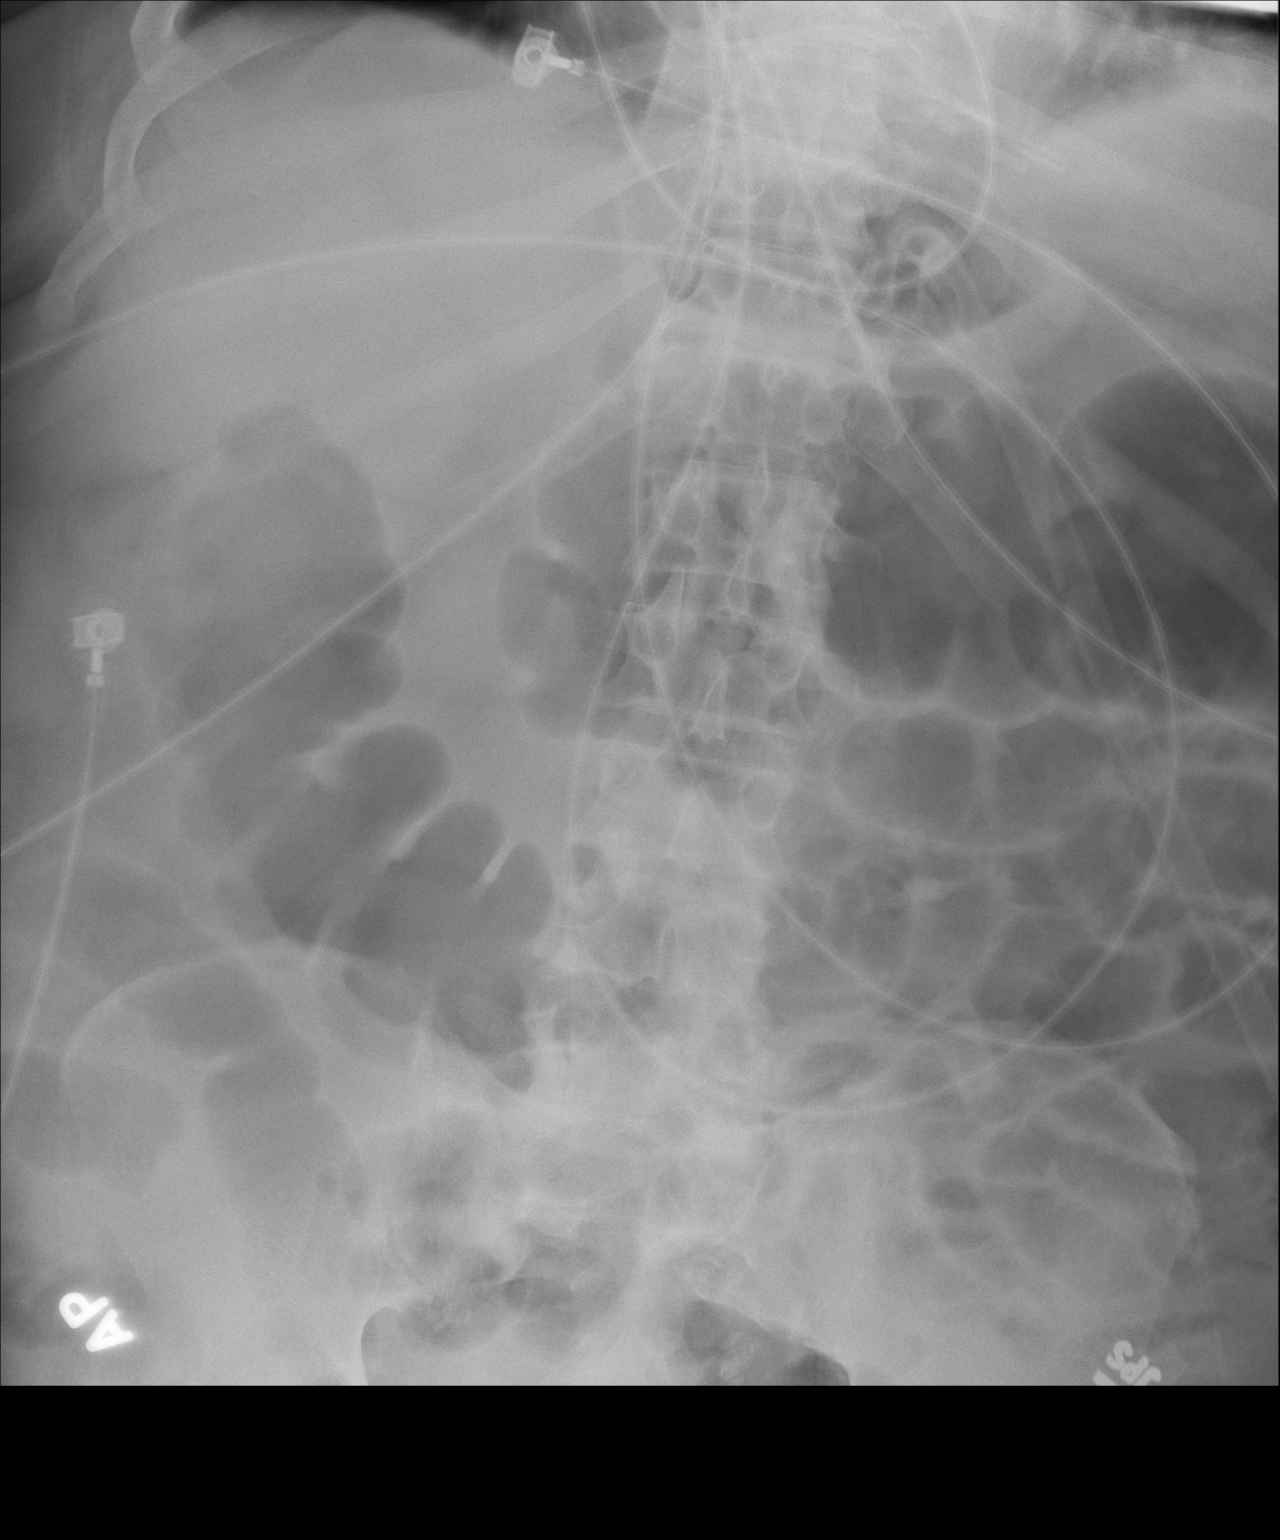

[1 of 1 positions shown; findings below may reference images not displayed]

FINDINGS: Again noted are gas-filled loops of small and large bowel. The bowel
gas distention in the left upper abdomen has progressed but the
distention has decreased in the right abdomen. Limited evaluation of
the pelvis.
IMPRESSION: Persistent gas-filled loops of small and large bowel. Bowel gas
pattern is nonspecific but could be associated with an ileus.

## 2016-02-12 IMAGING — CR DG ABD PORTABLE 1V
1 series · 1 of 1 positions shown · non-contrast
Comparison: 10/29/2013

CLINICAL DATA: Ileus

EXAM:
PORTABLE ABDOMEN - 1 VIEW

[AP]
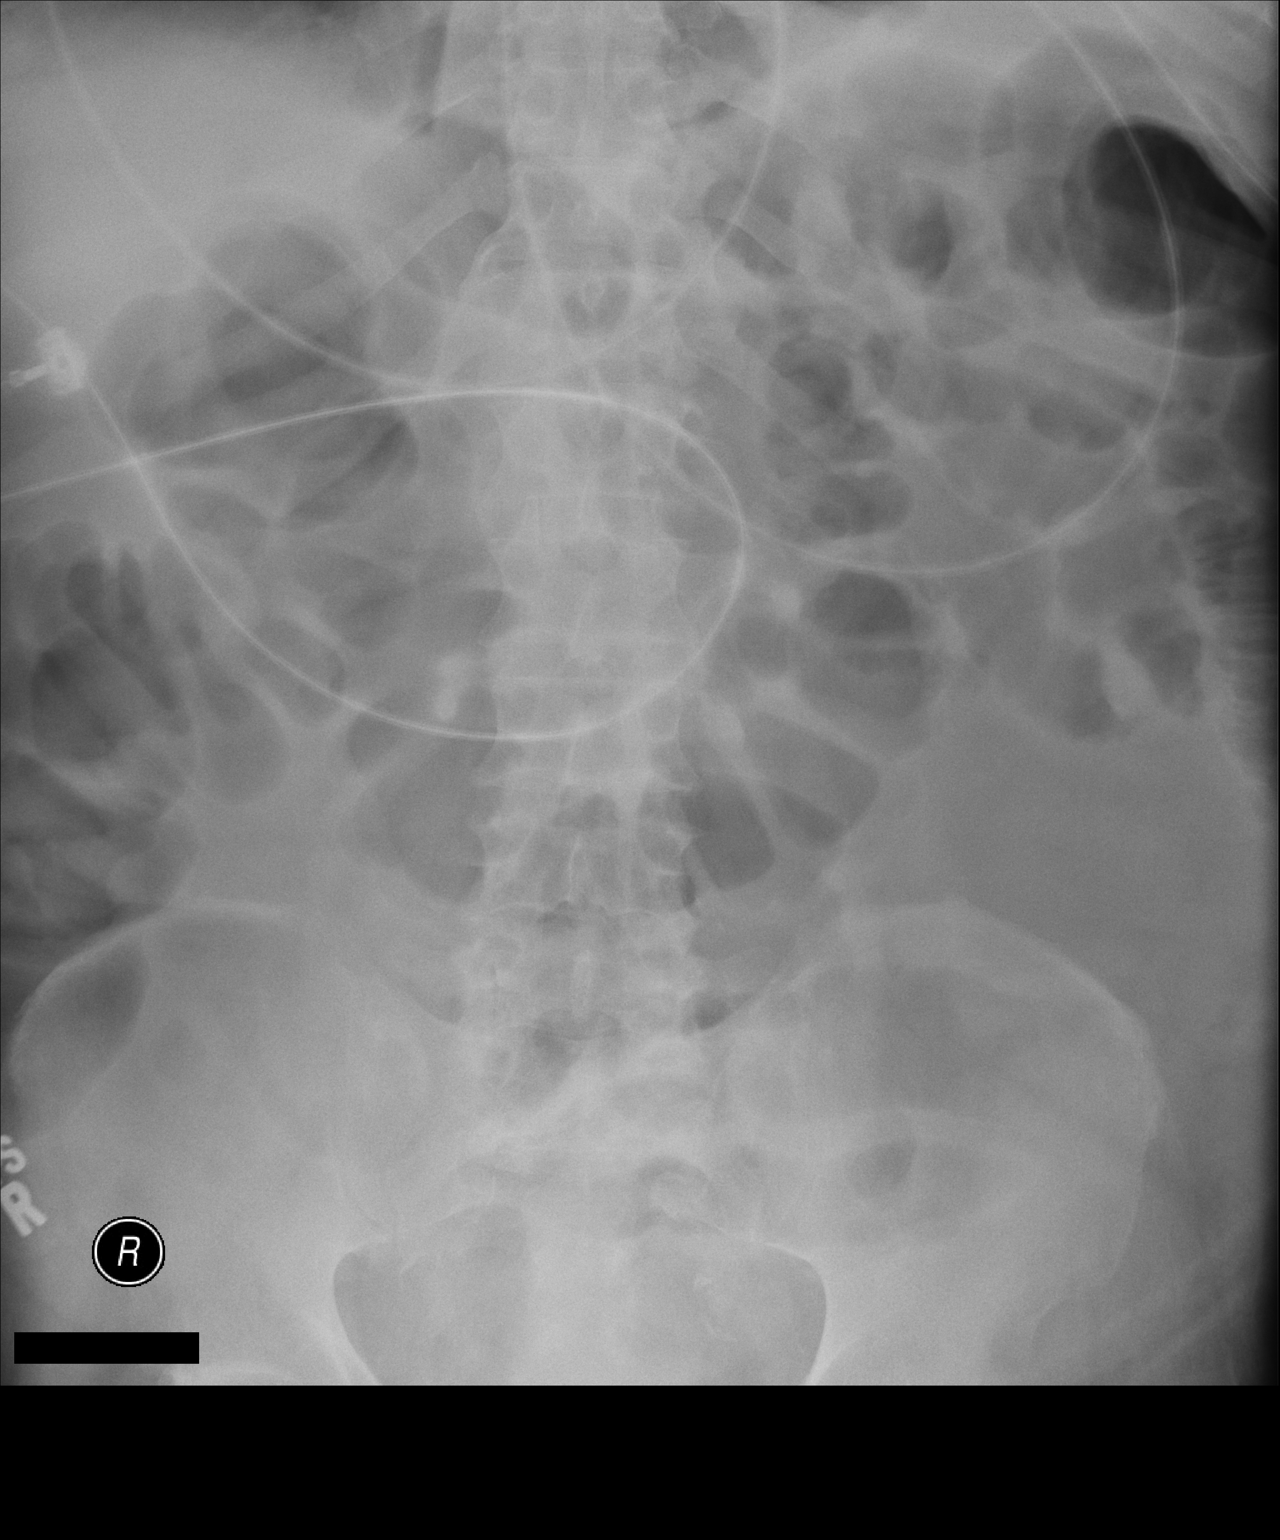

[1 of 1 positions shown; findings below may reference images not displayed]

FINDINGS: Improvement in ileus pattern. Decrease large and small bowel gas
since the prior study. No evidence of obstruction.
IMPRESSION: Improving ileus

## 2016-02-13 IMAGING — CR DG ABD PORTABLE 1V
1 series · 1 of 1 positions shown · non-contrast
Comparison: DG ABD PORTABLE 1V dated 10/31/2013; DG ABD PORTABLE 1V
dated 10/29/2013; DG ABD PORTABLE 1V dated 10/25/2013

CLINICAL DATA: Evaluate ileus

EXAM:
PORTABLE ABDOMEN - 1 VIEW

[AP]
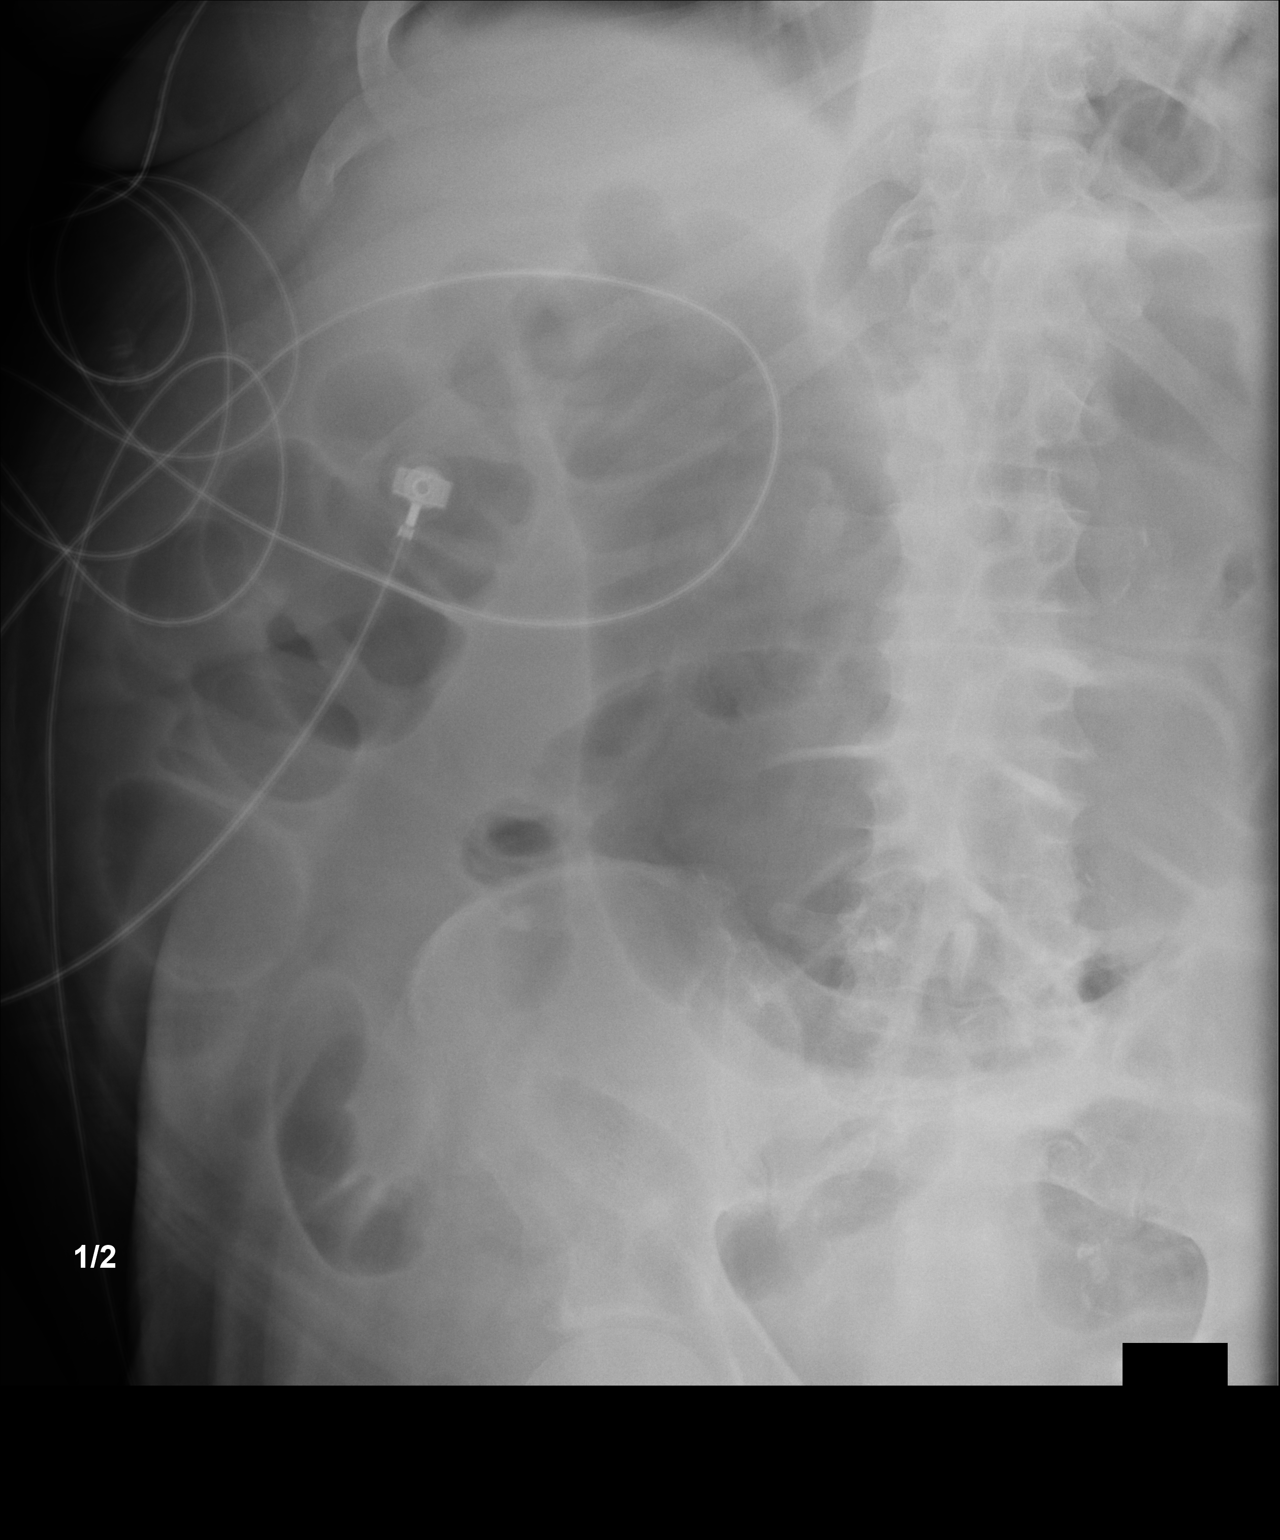

[1 of 1 positions shown; findings below may reference images not displayed]

FINDINGS: The examination is degraded secondary to patient body habitus and
portable technique.

There is moderate to marked gas distention of predominantly the
colon with index portion of the transverse colon measuring
approximately 8.3 cm in greatest transverse diameter, unchanged to
minimally worsened in the interval. There is very mild gaseous
distention of the upstream small bowel with index loop of small
bowel within the left mid hemiabdomen measuring approximately
cm, grossly unchanged.

Nondiagnostic evaluation for pneumoperitoneum secondary to supine
positioning exclusion of the lower thorax. No definite pneumatosis
or portal venous gas.

No definite abnormal intra-abdominal calcifications.

Multilevel lumbar spine DDD.
IMPRESSION: No change to slight worsening of suspected ileus

## 2016-02-17 IMAGING — DX DG ABD PORTABLE 1V
2 series · 2 of 2 positions shown · non-contrast
Comparison: DG ABD PORTABLE 1V dated 11/01/2013; DG ABD PORTABLE 1V
dated 10/31/2013; DG ABD PORTABLE 1V dated 10/29/2013

CLINICAL DATA: Ileus, abdominal pain

EXAM:
PORTABLE ABDOMEN - 1 VIEW

[supine ap (1 of 2)]
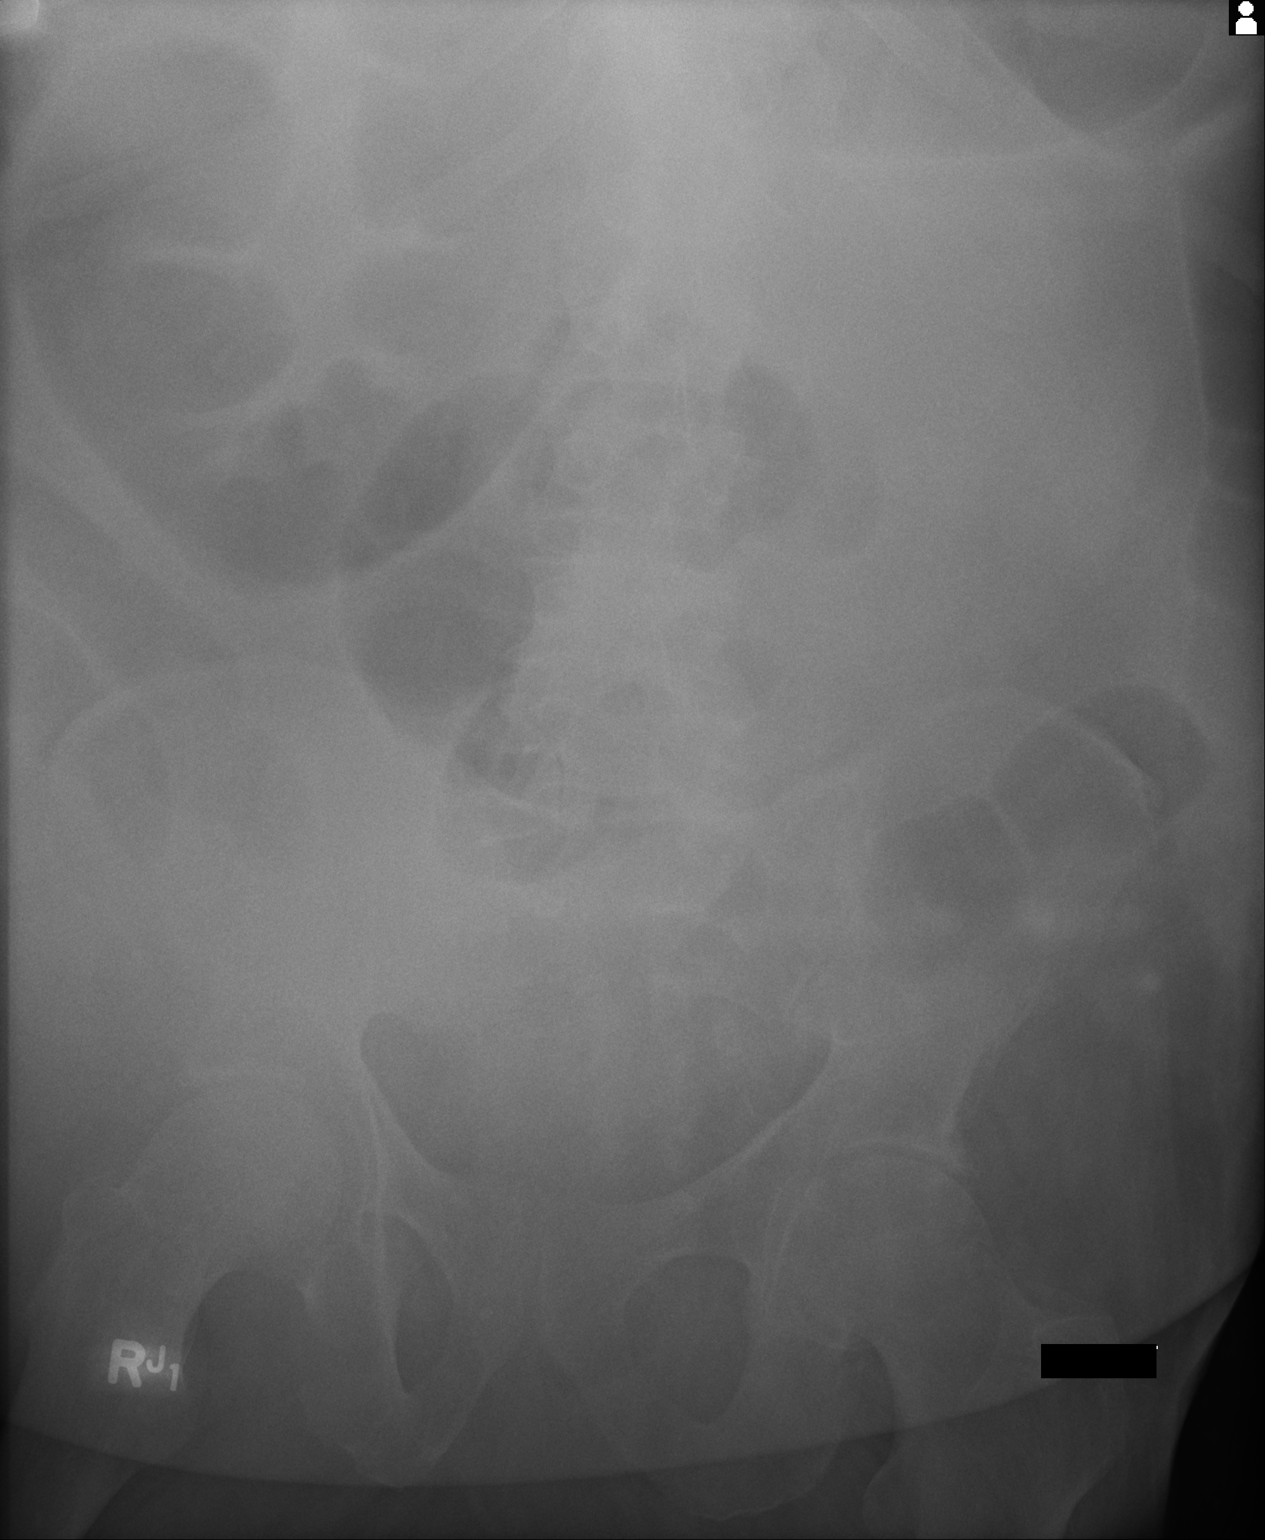

[supine ap (2 of 2)]
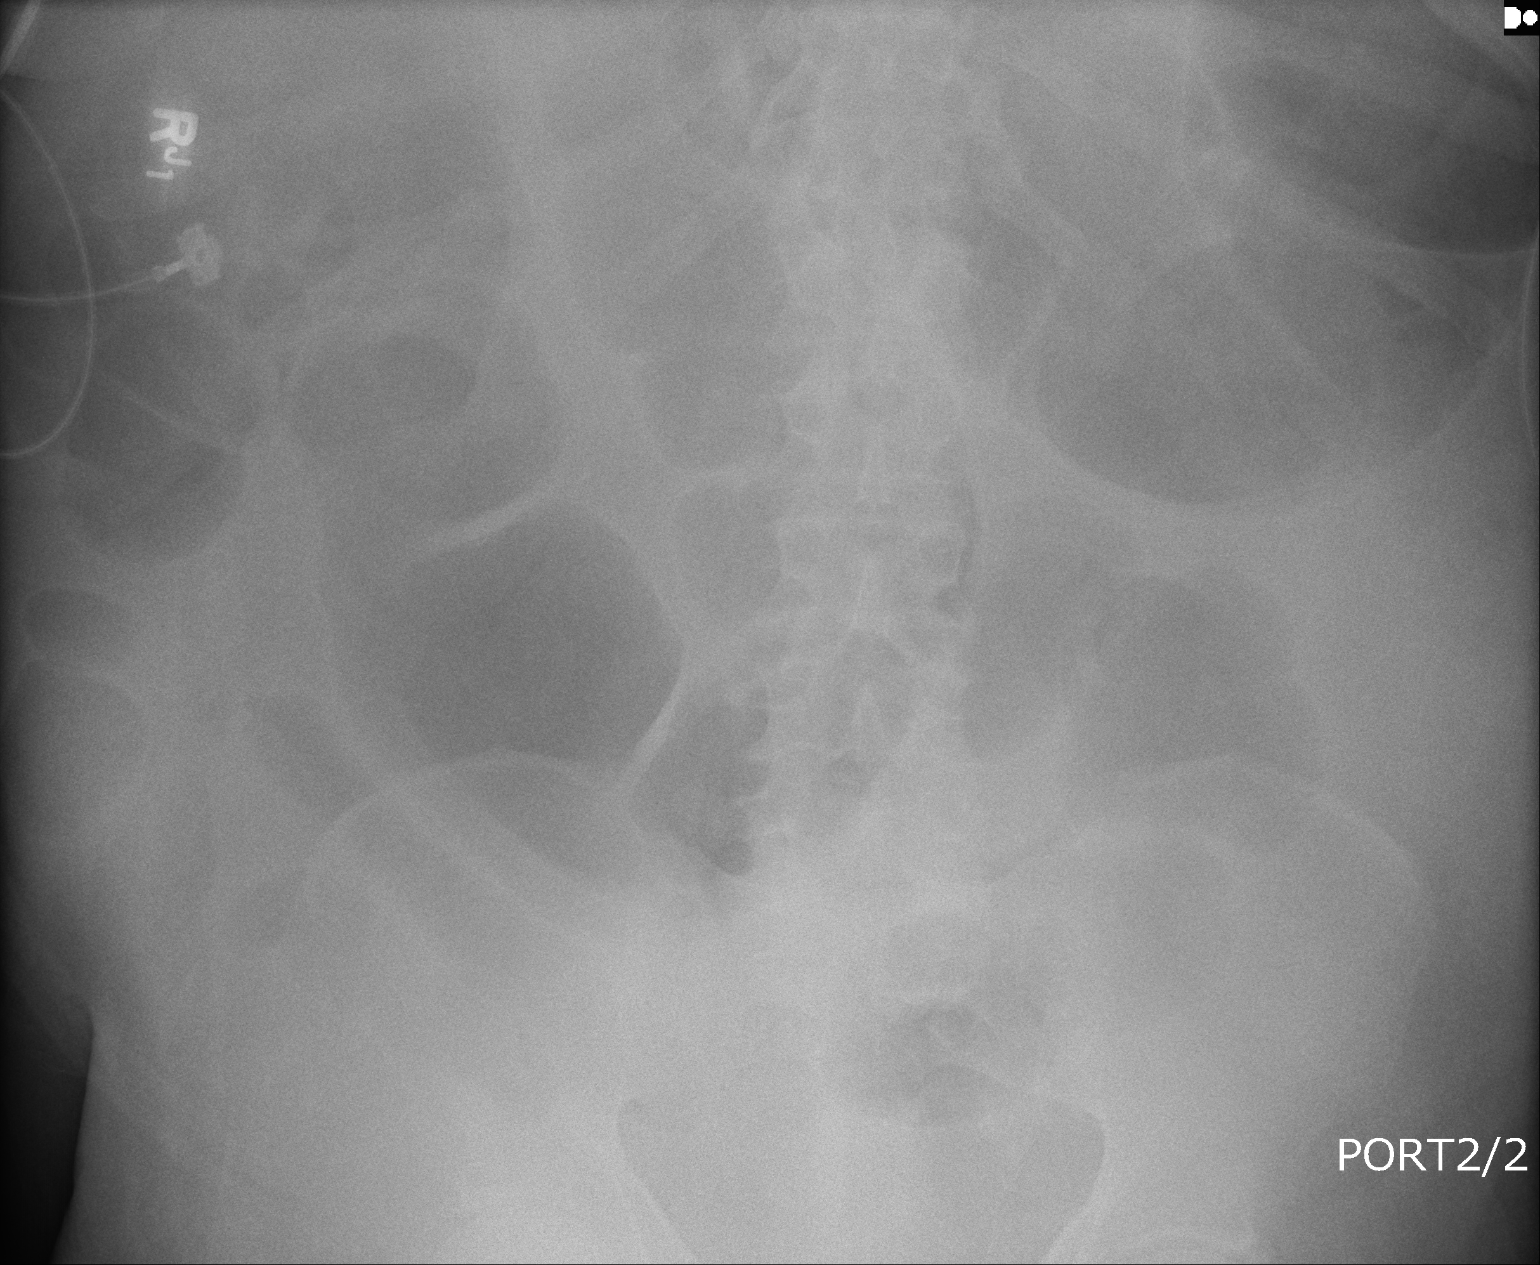

[2 of 2 positions shown; findings below may reference images not displayed]

FINDINGS: The examination again remains degraded due to patient body habitus
and portable technique.

There has been no significant interval change in previously noted
moderate gas distention of predominantly the colon with index loop
of transverse colon measuring approximately 8.8 cm in diameter.

Nondiagnostic evaluation pneumoperitoneum secondary to supine
positioning exclusion of the lower thorax.

No definite abnormal intra-abdominal calcifications.

Multilevel focal lumbar spine degenerative change suspected though
incompletely evaluated.
IMPRESSION: Degraded examination with similar findings of ileus.

## 2016-02-19 IMAGING — CR DG CHEST 1V PORT
1 series · 1 of 1 positions shown · non-contrast
Comparison: Film from earlier in the same day.

CLINICAL DATA: Check PICC line following readjustment

EXAM:
PORTABLE CHEST - 1 VIEW

[AP]
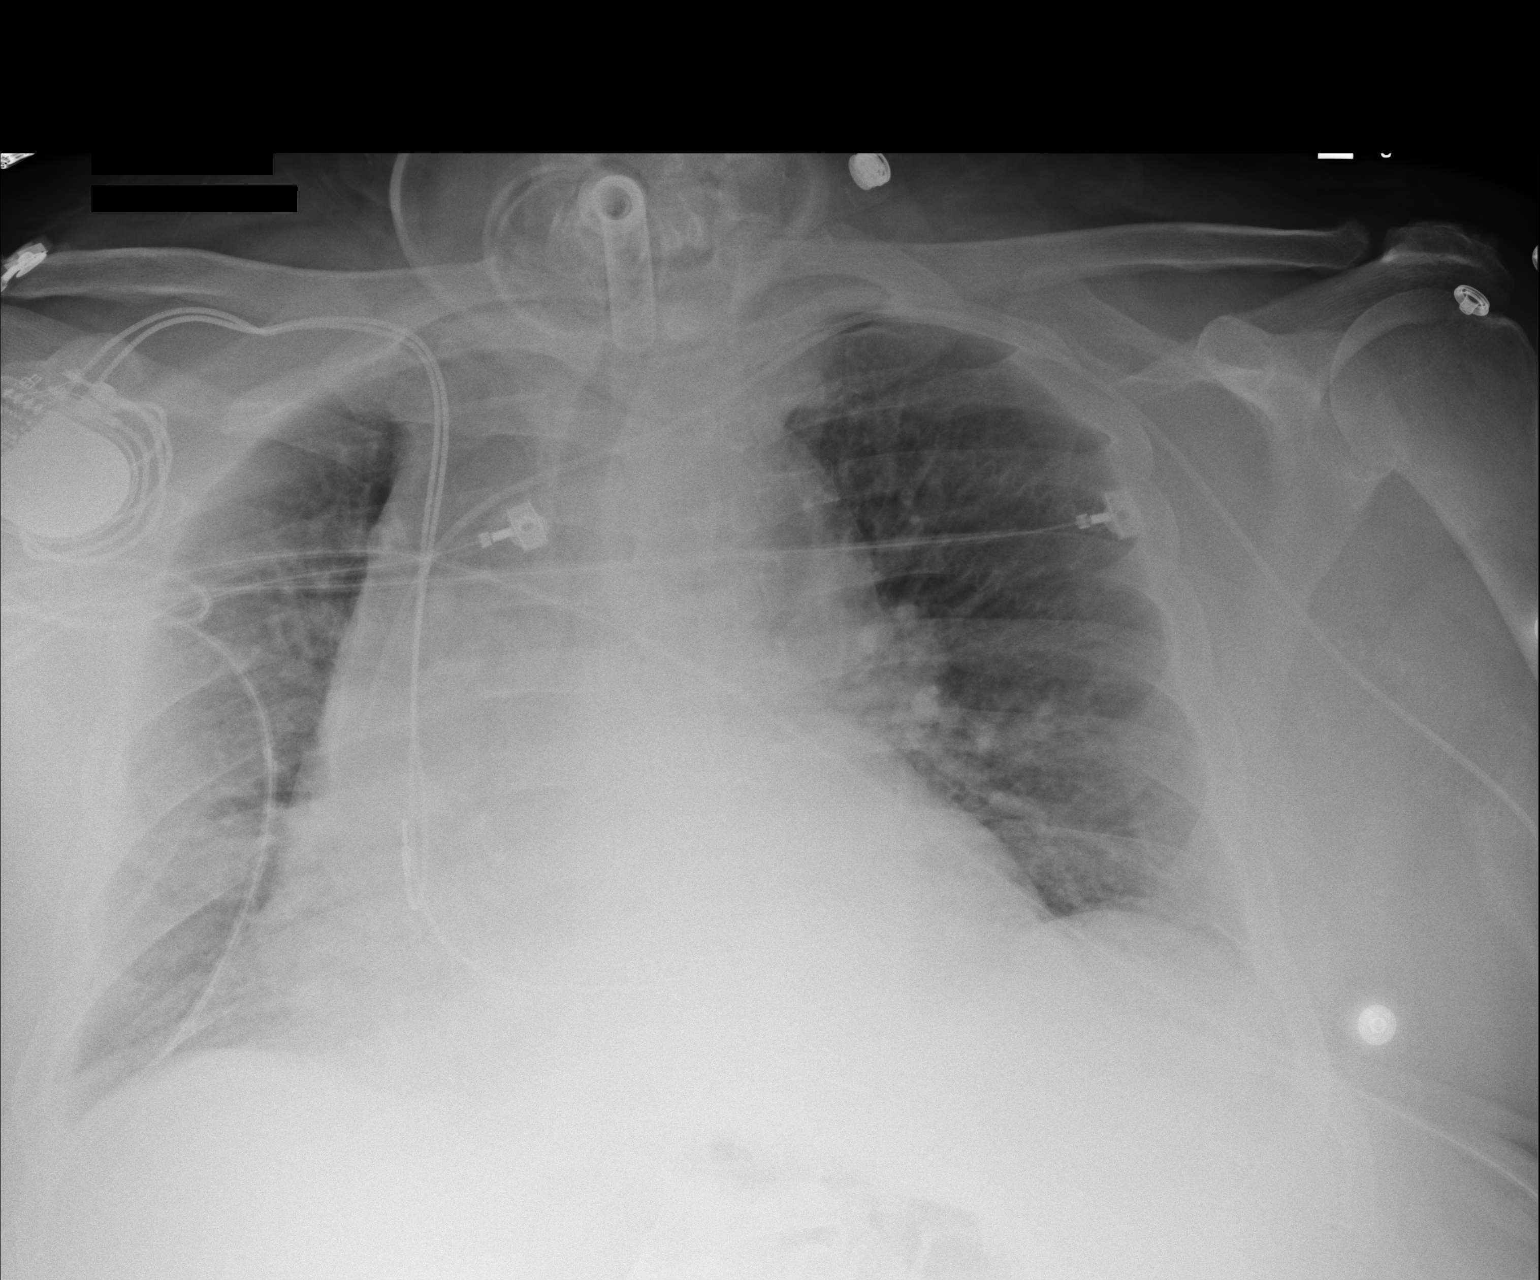

[1 of 1 positions shown; findings below may reference images not displayed]

FINDINGS: The PICC line is now in the mid to distal superior vena cava in
satisfactory position. Cardiac shadow is stable. A pacing device and
tracheostomy tube are stable as well. The lungs remain clear.
IMPRESSION: PICC line in satisfactory position in the superior vena cava.

## 2016-02-19 IMAGING — CR DG CHEST 1V PORT
1 series · 1 of 1 positions shown · non-contrast
Comparison: 11/02/2013

CLINICAL DATA: Status post PICC line placement

EXAM:
PORTABLE CHEST - 1 VIEW

[AP]
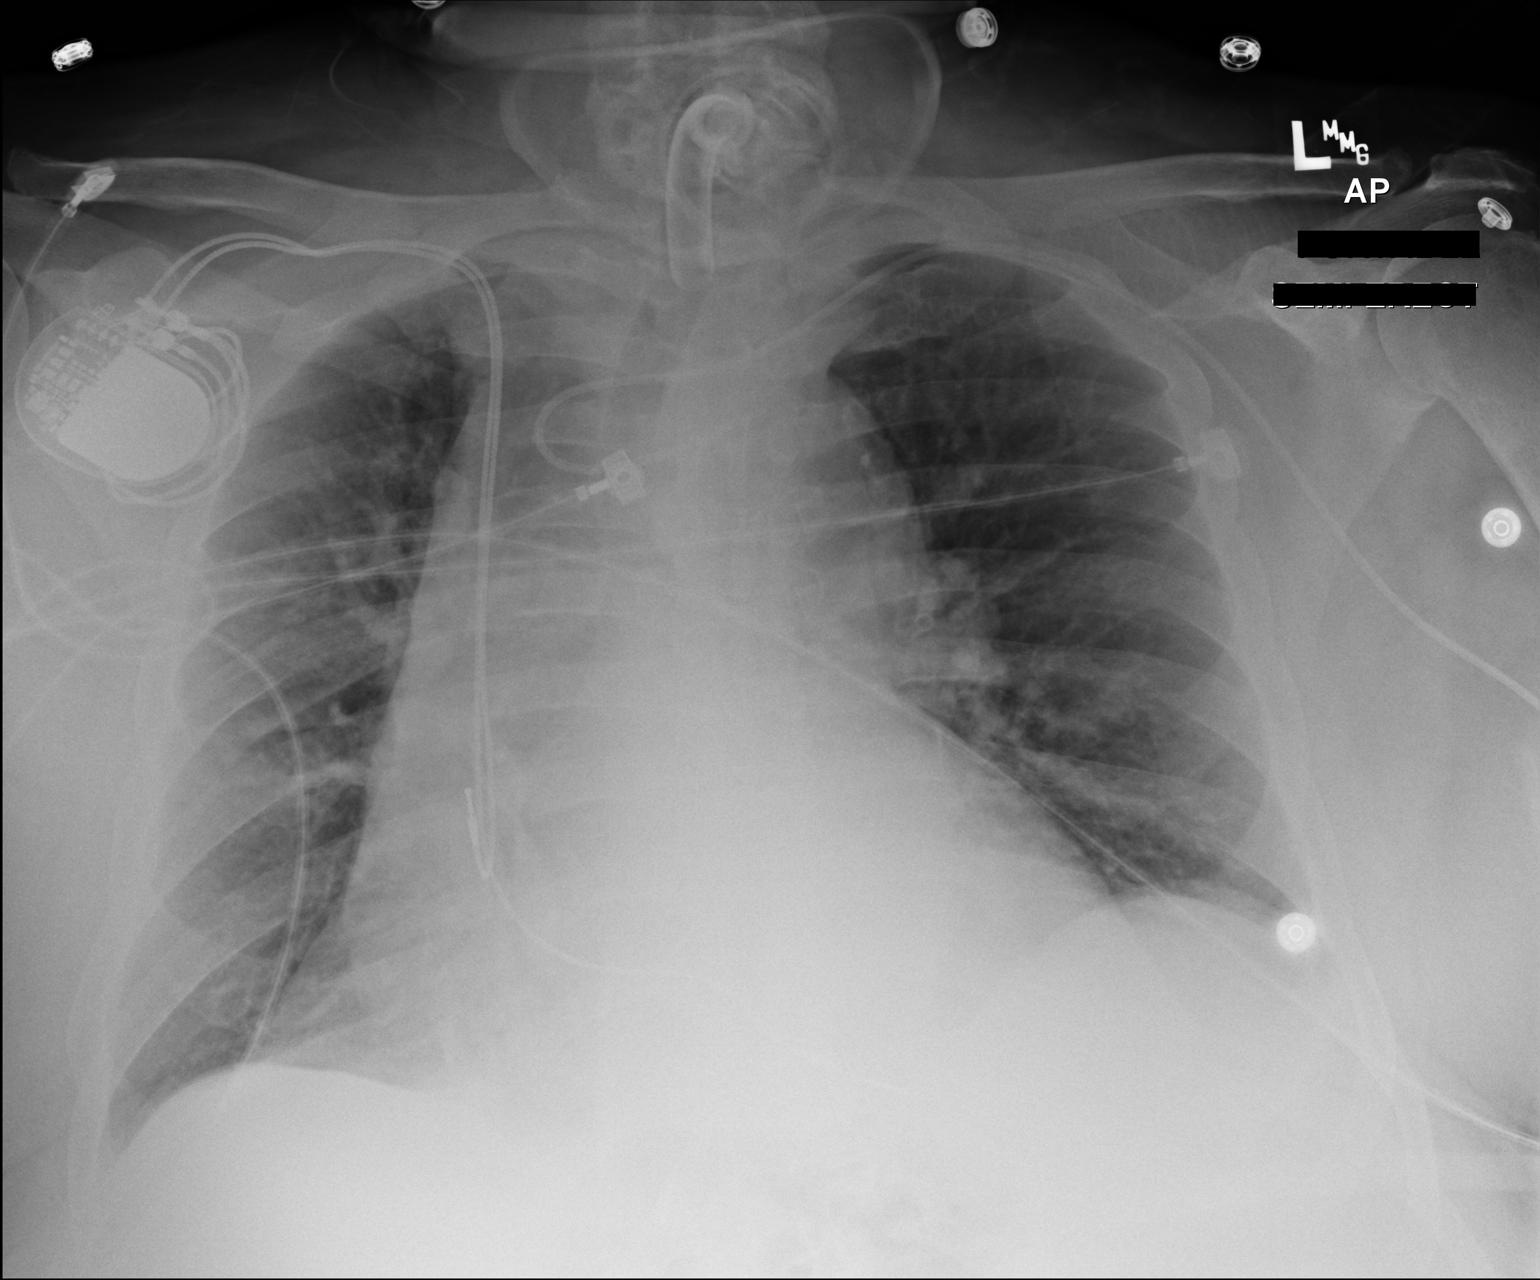

[1 of 1 positions shown; findings below may reference images not displayed]

FINDINGS: Cardiac shadow is stable. A pacing device is again seen. The lungs
are clear. A left-sided PICC line is seen with the catheter tip
coiled within the left innominate vein. This may relocate to the
superior vena cava on its own. No other focal abnormality is seen. A
tracheostomy tube is stable.
IMPRESSION: The overall appearance is stable. A new left-sided PICC line is
coiled within the left innominate vein. This may relocate into the
superior vena cava on its own. Clinical correlation is recommended.

## 2016-02-27 IMAGING — DX DG CHEST 1V PORT
1 series · 1 of 1 positions shown · non-contrast
Comparison: 11/07/2013 and earlier.

CLINICAL DATA: 67-year-old male with respiratory failure,
aspiration pneumonia. Initial encounter.

EXAM:
PORTABLE CHEST - 1 VIEW

[portable]
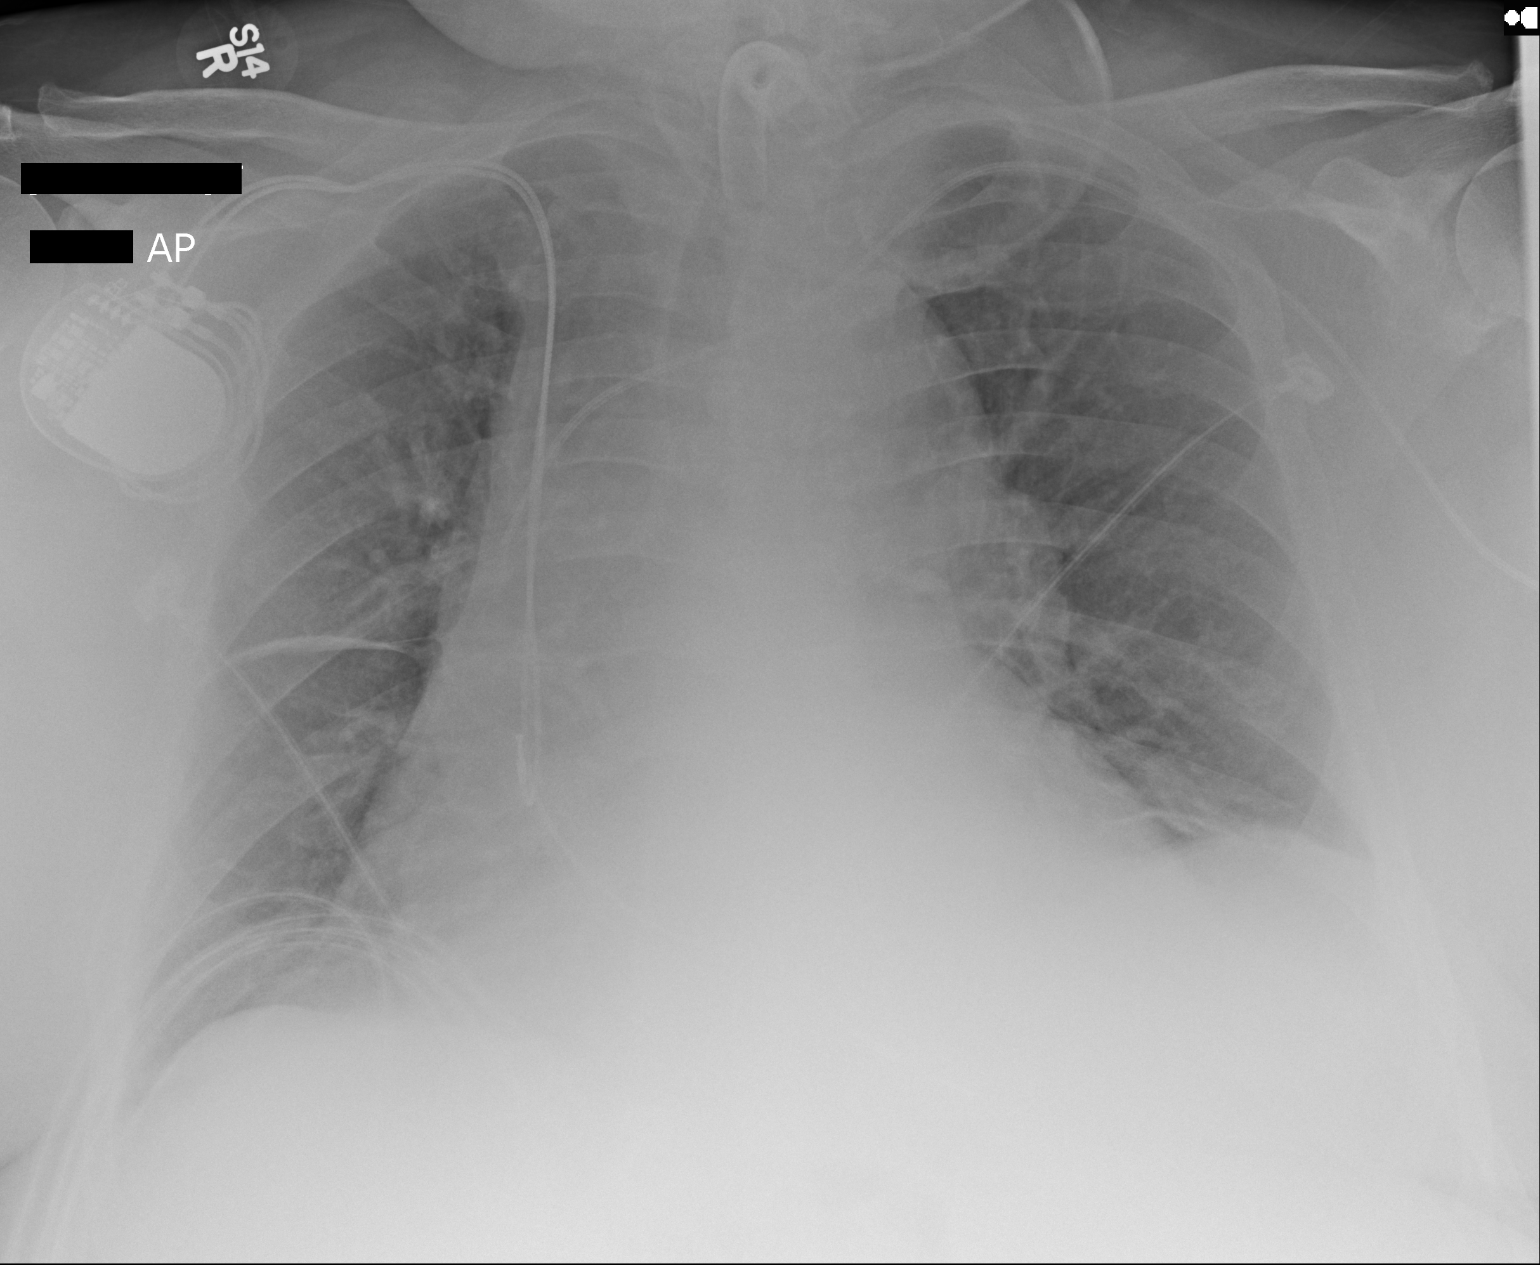

[1 of 1 positions shown; findings below may reference images not displayed]

FINDINGS: Portable AP semi upright view at 1119 hrs. Stable tracheostomy tube.
Stable left side PICC line. Stable cardiomegaly and mediastinal
contours. Stable right chest dual lead cardiac pacemaker. Stable
lung volumes. Increased fluid in the right minor fissure. Stable
pulmonary vascularity, without overt edema. Mild curvilinear opacity
at both bases most resembles atelectasis, superimposed on unchanged
bilateral lower lobe hypoventilation.
IMPRESSION: 1. Small volume pleural fluid now on the right minor fissure.
2. Otherwise stable chest with bilateral lower lobe and lung base
hypoventilation.

## 2016-05-05 ENCOUNTER — Telehealth: Payer: Self-pay | Admitting: Cardiovascular Disease

## 2016-05-05 NOTE — Telephone Encounter (Signed)
Received records from [facility] for appointment on 05/18/16 with Dr Sallyanne Kuster.  Records given to [facility] (medical records) for Dr Croitoru's schedule on 05/18/16. lp

## 2016-05-18 ENCOUNTER — Encounter: Payer: Self-pay | Admitting: Cardiovascular Disease

## 2016-05-18 ENCOUNTER — Ambulatory Visit (INDEPENDENT_AMBULATORY_CARE_PROVIDER_SITE_OTHER): Payer: Medicare (Managed Care) | Admitting: Cardiovascular Disease

## 2016-05-18 VITALS — BP 120/74 | HR 74 | Ht 72.0 in | Wt 353.0 lb

## 2016-05-18 DIAGNOSIS — I480 Paroxysmal atrial fibrillation: Secondary | ICD-10-CM | POA: Diagnosis not present

## 2016-05-18 DIAGNOSIS — I2510 Atherosclerotic heart disease of native coronary artery without angina pectoris: Secondary | ICD-10-CM

## 2016-05-18 DIAGNOSIS — I495 Sick sinus syndrome: Secondary | ICD-10-CM | POA: Diagnosis not present

## 2016-05-18 DIAGNOSIS — Z950 Presence of cardiac pacemaker: Secondary | ICD-10-CM

## 2016-05-18 DIAGNOSIS — G4733 Obstructive sleep apnea (adult) (pediatric): Secondary | ICD-10-CM

## 2016-05-18 DIAGNOSIS — I5033 Acute on chronic diastolic (congestive) heart failure: Secondary | ICD-10-CM

## 2016-05-18 DIAGNOSIS — I10 Essential (primary) hypertension: Secondary | ICD-10-CM

## 2016-05-18 DIAGNOSIS — N183 Chronic kidney disease, stage 3 unspecified: Secondary | ICD-10-CM

## 2016-05-18 LAB — CUP PACEART INCLINIC DEVICE CHECK
Battery Remaining Longevity: 108 mo
Battery Voltage: 2.79 V
Brady Statistic AP VP Percent: 4 %
Brady Statistic AS VP Percent: 0.1 % — CL
Date Time Interrogation Session: 20171003154557
Implantable Lead Implant Date: 20050310
Implantable Lead Location: 753859
Implantable Lead Location: 753860
Implantable Lead Model: 5092
Implantable Lead Model: 5594
Lead Channel Pacing Threshold Amplitude: 0.75 V
Lead Channel Sensing Intrinsic Amplitude: 15.68 mV
Lead Channel Sensing Intrinsic Amplitude: 5.6 mV
Lead Channel Setting Pacing Amplitude: 4 V
Lead Channel Setting Pacing Pulse Width: 1 ms
MDC IDC LEAD IMPLANT DT: 20050301
MDC IDC MSMT BATTERY IMPEDANCE: 225 Ohm
MDC IDC MSMT LEADCHNL RA IMPEDANCE VALUE: 418 Ohm
MDC IDC MSMT LEADCHNL RA PACING THRESHOLD PULSEWIDTH: 0.4 ms
MDC IDC MSMT LEADCHNL RV IMPEDANCE VALUE: 762 Ohm
MDC IDC MSMT LEADCHNL RV PACING THRESHOLD AMPLITUDE: 2.25 V
MDC IDC MSMT LEADCHNL RV PACING THRESHOLD PULSEWIDTH: 1 ms
MDC IDC SET LEADCHNL RA PACING AMPLITUDE: 1.5 V
MDC IDC SET LEADCHNL RV SENSING SENSITIVITY: 5.6 mV
MDC IDC STAT BRADY AP VS PERCENT: 94.3 %
MDC IDC STAT BRADY AS VS PERCENT: 1.7 %

## 2016-05-18 NOTE — Patient Instructions (Addendum)
Dr Croitoru recommends that you schedule a follow-up appointment in 6 months with a pacemaker check. You will receive a reminder letter in the mail two months in advance. If you don't receive a letter, please call our office to schedule the follow-up appointment.  If you need a refill on your cardiac medications before your next appointment, please call your pharmacy. 

## 2016-05-18 NOTE — Progress Notes (Signed)
Patient ID: [name], male   DOB: 1946-05-26, 70 y.o.   MRN: [id number]    Cardiology Office Note    Date:  05/18/2016   ID:  [name], DOB Sep 05, 1945, MRN [id number]  PCP:  Ryan Maroon, MD  Cardiologist:   Ryan Klein, MD   Chief Complaint  Patient presents with  . Follow-up    History of Present Illness:  [name] is a 70 y.o. male with long-standing diastolic heart failure, hypertensive heart disease, nonobstructive coronary artery disease by remote coronary angiography, moderate chronic kidney disease, obstructive sleep apnea, morbid obesity, type 2 diabetes mellitus complicated by blindness and severe peripheral neuropathy and recurrent poor wound healing, hyperlipidemia, hypertension who presents for pacemaker follow-up. It is important to note that he is blind and therefore requires assistance with transportation (uses [facility]) and has his medications prepacked in daily blister packs, prepared once weekly on Saturdays.  He is here for a pacemaker check and clinical follow-up.   He denies chest pain or pressure. She has not had syncope or palpitations. He complains of some worsening of his edema and dyspnea. He describes NYHA functional class III status. He does not appear to have orthopnea or PND. His weight is similar to June of this year, but 7 pounds higher than it was in March of this year. He sees [name] for his kidney problems and was last evaluated on September 13. He takes metolazone twice a week as well as a very high dose of torsemide.  His device was implanted for symptomatic sinus bradycardia in 2005, with a generator change in 2014. He has 98% atrial pacing and only 4% ventricular pacing. He has very long AV delay at over 400 ms today, but ventricular pacing is kept down through MVP protocol.  At this point generator longevity is estimated to be roughly 9 years. He has not had any significant atrial fibrillation since his  last device check and has not had high ventricular rates. He has chronically elevated right ventricular pacing thresholds which have not generally been of concern since he has such a low requirement for ventricular pacing.  Despite his history of atrial fibrillation and stroke he is unable to receive anticoagulation due to a previous left parenchymal temporoparietal hematoma that required surgical evacuation. 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  He is extremely sedentary. He recently reports worsening dyspnea when he tries to lay down in bed. He is unsure if he has more than usual swelling in his legs. He denies angina pectoris. He has not had any new focal neurological deficits, but complains about severe stinging and numbness in his fingers and toes, described as a sensation of "frostbite", consistent with neuropathy  Past Medical History:  Diagnosis Date  . Arthritis    "all over" (06/23/2013)  . Atrial fibrillation ([place])    not Candidate for Coumadin d/t rectal bleeding  . Benign prostatic hypertrophy   . Cardiomyopathy, hypertensive ([place])     diastolic dysfunction by 1025 echo  . Coronary artery disease     nonobstructive. 2 vessel. cardiac catheter in March 2005 showing 60% LAD occlusion, 30% circumflex occlusion.  . Degenerative joint disease     Right knee.  . Diabetes type 2, uncontrolled ([place])   . Diabetic peripheral neuropathy ([place])   . Diabetic ulcer of thigh ([place])    H/O Rt thigh ulcer.  . Exertional shortness of breath    "  comes and goes" (14-Jul-2013)  . GERD (gastroesophageal reflux disease)   . Gout   . Herpes   . Hyperlipidemia   . Hypertension   . Hypotension 09/05/2013  . Incarcerated ventral hernia    H/O. S/P repair ventral hernia repair 07/2005.  Marland Kitchen LBBB (left bundle branch block)   . Legally blind   . Morbid obesity ([place])   . Obstructive sleep apnea    On CPAP. Last sleep study  (06/2009) - Severe obstructive sleep apnea/hypopnea syndrome, apnea-hypopnea index 70.5 per hour with non positional events moderately loud snoring and oxygen desaturation to a nadir of 85% on room air.  . Occult blood positive stool     History of in August 2007.  colonoscopy in January 2008 showing normal colon with fair inadequate prep. By Dr. Silvano Rusk.  . Pacemaker November 28, 2003    secondary to bradycardia  . Sepsis ([place]) 08/26/2013    Past Surgical History:  Procedure Laterality Date  . AMPUTATION Left 06/22/2013   Procedure: AMPUTATION FOOT;  Surgeon: Newt Minion, MD;  Location: [facility];  Service: Orthopedics;  Laterality: Left;  Left Midfoot Amputation  . CARDIAC CATHETERIZATION  10/22/2003   Dilatation of the LV with preserved LV systolic function. Tortous aorta, but no AAA. Moderate disease with 50-60% area of narrowing in the circumflex after the second OM and 70% mid narrowing in the LAD. No intervention.  Marland Kitchen CARDIOVASCULAR STRESS TEST  01/23/2010   Perfusion defect seen in inferior myocardial region is consistent with diaphragmatic attenuation. Remaining myocardium demonstrates normal myocardial perfusion with no evidence of ischemia or infarct. Stress ECG was non-diagnostic due to LBBB.  Marland Kitchen CATARACT EXTRACTION, BILATERAL Bilateral   . ESOPHAGOGASTRODUODENOSCOPY N/A 10/12/2013   Procedure: ESOPHAGOGASTRODUODENOSCOPY (EGD);  Surgeon: Gwenyth Ober, MD;  Location: [facility];  Service: General;  Laterality: N/A;  bedside  . EYE SURGERY    . 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 HERNIA REPAIR    . INSERT / REPLACE / REMOVE PACEMAKER  2003/11/28   Secondary to symptomatic bradycardia. DDDR pacemaker. By Dr. Rollene Fare.  Randolm Idol / REPLACE / REMOVE PACEMAKER  11-27-12   "old pacemaker died; had new one put in" (07-14-13)  . Pacemaker lead revision  12/2003    likely microdislodgment  of right ventricular lead  . PEG PLACEMENT N/A 10/12/2013   Procedure:  PERCUTANEOUS ENDOSCOPIC GASTROSTOMY (PEG) PLACEMENT;  Surgeon: Gwenyth Ober, MD;  Location: [facility];  Service: General;  Laterality: N/A;  . PERMANENT PACEMAKER GENERATOR CHANGE N/A 11/03/2012   Procedure: PERMANENT PACEMAKER GENERATOR CHANGE;  Surgeon: Ryan Klein, MD;  Location: [facility] C[facility];  Service: Cardiovascular;  Laterality: N/A;  . REFRACTIVE SURGERY Bilateral   . TRANSTHORACIC ECHOCARDIOGRAM  01/01/2013   EF 50-55%, ventricular septum-thickness moderately increased  . VENOUS DOPPLER  01/17/2013   No evidence of thrombus or thrombophlebitis. Left GSVs appear enlarged and demonstrate valvular insuffiiciency. Bilateral SSVs appeared patent with no valvular insufficiency seen.  Marland Kitchen VENTRAL HERNIA REPAIR  07/2005    S/P laparotomy, lysis of adhesions, repair of incarcerated ventral hernia with  partial omentectomy.. Performed by Dr. Marlou Starks.    Outpatient Medications Prior to Visit  Medication Sig Dispense Refill  . acetaminophen (TYLENOL) 650 MG CR tablet Take 650 mg by mouth 2 (two) times daily.    Marland Kitchen albuterol (PROVENTIL HFA;VENTOLIN HFA) 108 (90 Base) MCG/ACT inhaler Inhale 2 puffs into the lungs every 6 (six) hours as needed for wheezing or shortness of  breath.    . allopurinol (ZYLOPRIM) 100 MG tablet Take 100 mg by mouth daily. Reported on 12/09/2015    . amiodarone (PACERONE) 200 MG tablet Take 200 mg by mouth daily. Reported on 12/09/2015    . amLODipine (NORVASC) 10 MG tablet Take 10 mg by mouth daily. Reported on 12/09/2015    . aspirin 325 MG tablet Take 325 mg by mouth daily.    Marland Kitchen atorvastatin (LIPITOR) 40 MG tablet Take 1 tablet (40 mg total) by mouth daily. 90 tablet 3  . Cholecalciferol (VITAMIN D-3) 1000 UNITS CAPS Take 1 capsule by mouth daily.    Marland Kitchen gabapentin (NEURONTIN) 300 MG capsule Take 300 mg by mouth 2 (two) times daily.    . hydrALAZINE (APRESOLINE) 25 MG tablet Take 25 mg by mouth 3 (three) times daily.    . Insulin Glargine (TOUJEO SOLOSTAR) 300 UNIT/ML SOPN Inject  125 Units into the skin 2 (two) times daily.     Marland Kitchen lidocaine (LIDODERM) 5 % Place 1 patch onto the skin daily as needed (back pain). Remove & Discard patch within 12 hours or as directed by MD    . Liraglutide (VICTOZA) 18 MG/3ML SOPN Inject 1.8 mg into the skin daily.    . Melatonin 3 MG CAPS Take 1 capsule by mouth at bedtime as needed (sleep).     . Menthol, Topical Analgesic, (BIOFREEZE EX) Apply 1 application topically daily as needed (pain). Reported on 10/27/2015    . methylcellulose (ARTIFICIAL TEARS) 1 % ophthalmic solution Place 1 drop into both eyes 2 (two) times daily.    . metolazone (ZAROXOLYN) 2.5 MG tablet Take 2.5 mg by mouth as directed. 1 tab twice weekly on Monday and Thursdays    . metoprolol succinate (TOPROL-XL) 100 MG 24 hr tablet Take 100 mg by mouth daily. Take with or immediately following a meal.    . pantoprazole (PROTONIX) 40 MG tablet Take 40 mg by mouth daily.    . potassium chloride SA (K-DUR,KLOR-CON) 20 MEQ tablet Take 20 mEq by mouth as directed. Take 2 tab (66mEq) every morning and 1 tab (20 mEq) every evening    . sennosides-docusate sodium (SENOKOT-S) 8.6-50 MG tablet Take 1 tablet by mouth daily as needed for constipation.    . Skin Protectants, Misc. (EUCERIN) cream Apply 1 application topically 2 (two) times daily.    Marland Kitchen torsemide (DEMADEX) 100 MG tablet Take 1 tablet (100 mg total) by mouth 2 (two) times daily. 60 tablet 6   No facility-administered medications prior to visit.      Allergies:   Review of patient's allergies indicates no known allergies.   Social History   Social History  . Marital status: Single    Spouse name: N/A  . Number of children: Y  . Years of education: N/A   Occupational History  . disabled    Social History Main Topics  . Smoking status: Former Smoker    Packs/day: 0.00    Years: 0.00    Types: Cigarettes    Quit date: 08/17/2003  . Smokeless tobacco: Never Used     Comment: started at age 17.  only smoked on occ-  very rarely.0  . Alcohol use Yes     Comment: 06/23/2013 "drank some; never had a problem w/it"  . Drug use: No  . Sexual activity: No   Other Topics Concern  . None   Social History Narrative  . None     Family History:  The patient's family history  includes Allergies in his sister; Asthma in his brother; Coronary artery disease in his sister; Heart disease in his sister; Rheum arthritis in his brother.   ROS:   Please see the history of present illness.    ROS All other systems reviewed and are negative.   PHYSICAL EXAM:   VS:  BP 120/74 (BP Location: Left Arm, Patient Position: Sitting, Cuff Size: Large)   Pulse 74   Ht 6' (1.829 m)   Wt (!) 160.1 kg (353 lb)   SpO2 97%   BMI 47.88 kg/m    General: Alert, oriented x3, no distress; morbid obesity interferes with the physical exam, blind Head: no evidence of trauma, no myxedema, no xanthelasma; normal ears, nose and oropharynx Neck: Unable to see jugular venous pulsations; brisk carotid pulses without delay and no carotid bruits Chest: clear to auscultation, no signs of consolidation by percussion or palpation, normal fremitus, symmetrical and full respiratory excursions Cardiovascular: Unable to locate the apical impulse, regular rhythm, normal first and second heart sounds, early peaking 1/6 aortic ejection murmur, no diastolic murmurs, rubs or gallops; healthy subclavian pacemaker Abdomen: no tenderness or distention, no masses by palpation, no abnormal pulsatility or arterial bruits, normal bowel sounds, no hepatosplenomegaly Extremities: no clubbing, cyanosis; chronic brawny edema of the ankles and calves bilaterally, 3+; 2+ radial, ulnar and brachial pulses bilaterally; unable to palpate the lower extremity pulses; no subclavian or femoral bruits Neurological: grossly nonfocal except blindness Psych: euthymic mood, full affect  Wt Readings from Last 3 Encounters:  05/18/16 (!) 160.1 kg (353 lb)  02/04/16 (!) 160.6 kg  (354 lb)  12/09/15 (!) 157.9 kg (348 lb)      Studies/Labs Reviewed:   EKG:  EKG is ordered today.  The ekg ordered today demonstrates atrial paced, ventricular sensed rhythm with QS pattern in leads V1-V2. Another EKG performed a little earlier showed MVP behavior with occasional ventricular pacing due to very long AV conduction times  Recent Labs: 09/03/2015: B Natriuretic Peptide 27.2 02/04/2016: BUN 48; Creatinine, Ser 2.36; Potassium 3.7; Sodium 136   Lipid Panel    Component Value Date/Time   CHOL 134 10/05/2013 0500   TRIG 111 10/29/2013 0611   HDL 37 (L) 10/05/2013 0500   CHOLHDL 3.6 10/05/2013 0500   VLDL 30 10/05/2013 0500   LDLCALC 67 10/05/2013 0500    Additional studies/ records that were reviewed today include:  Heart Failure clinic notes    ASSESSMENT:    1. Acute on chronic diastolic CHF (congestive heart failure) ([place])   2. SSS (sick sinus syndrome) ([place])   3. Pacemaker   4. Paroxysmal atrial fibrillation (HCC)   5. Coronary artery disease involving native coronary artery of native heart without angina pectoris   6. Essential hypertension   7. OSA (obstructive sleep apnea)   8. CKD (chronic kidney disease) stage 3, GFR 30-59 ml/min   9. Morbid obesity ([place])      PLAN:  In order of problems listed above:   1. CHF: He seems to be describing symptoms of worsening hypervolemia. Assessment of on status by physical exam is seriously limited by morbid obesity. He weighs roughly 3 pounds more than he did at his last heart failure clinic appointment and 4-9 pounds more than his previous estimated "dry weight". I have asked him to increase his metolazone to 3 times weekly (Mondays, Wednesdays, Fridays) but this change will not take place until next week since he has pre-loaded blister packs with medications. He does not appear  to be in acute respiratory distress today. Need to balance need for diuresis with renal dysfunction concerns. 2. SSS: Heart rate histogram  is satisfactory considering how sedentary he is. 3. PPM: Normal device function. His AV delay is longer than the past but we are still managing to keep ventricular pacing down to only 4%. He has relatively high ventricular pacing thresholds and it be preferable to avoid ventricular pacing for several reasons. Unable to do remote downloads. TPacemaker check in the office scheduled for 6 months. 4. PAF: He has not had any episodes of atrial fibrillation since his last device check. He cannot be anticoagulated due to history of intracerebral hemorrhage 5. CAD: He does not have angina pectoris. Had nonobstructive lesions by last coronary angiogram 6. HTN: Blood pressure is well controlled 7. OSA: Reports compliance with CPAP 8. CKD: Needs frequent monitoring of renal parameters especially when diuretics are adjusted. I wonder if it is time for him to start the discussion surrounding need for dialysis access. 9. Obesity: His obesity is probably playing a big part in his heart failure and sleep apnea.    Medication Adjustments/Labs and Tests Ordered: Current medicines are reviewed at length with the patient today.  Concerns regarding medicines are outlined above.  Medication changes, Labs and Tests ordered today are listed in the Patient Instructions below. There are no Patient Instructions on file for this visit.     Signed, Ryan Klein, MD  05/18/2016 9:55 AM    [facility] [place], [place], [place]  [address] Phone: [phone]; Fax: [phone]

## 2016-05-19 DIAGNOSIS — I495 Sick sinus syndrome: Secondary | ICD-10-CM | POA: Insufficient documentation

## 2016-10-12 ENCOUNTER — Other Ambulatory Visit: Payer: Self-pay | Admitting: Nurse Practitioner

## 2016-10-12 ENCOUNTER — Ambulatory Visit
Admission: RE | Admit: 2016-10-12 | Discharge: 2016-10-12 | Disposition: A | Discharge status: Home / Self Care | Payer: Medicare (Managed Care) | Source: Ambulatory Visit | Attending: Nurse Practitioner | Admitting: Nurse Practitioner

## 2016-10-12 DIAGNOSIS — R059 Cough, unspecified: Secondary | ICD-10-CM

## 2016-10-12 DIAGNOSIS — R05 Cough: Secondary | ICD-10-CM

## 2016-12-07 ENCOUNTER — Encounter: Payer: Medicare (Managed Care) | Admitting: Cardiovascular Disease

## 2017-01-04 NOTE — Progress Notes (Signed)
Patient ID: [name], male   DOB: Dec 06, 1945, 71 y.o.   MRN: [id number]    Cardiology Office Note    Date:  01/06/2017   ID:  [name], DOB 1945/09/28, MRN [id number]  PCP:  Ryan Adie, MD  Cardiologist:   Ryan Klein, MD   Chief Complaint  Patient presents with  . Follow-up    pt c/o sob all the time , weezing    History of Present Illness:  [name] is a 71 y.o. male with long-standing diastolic heart failure, hypertensive heart disease, nonobstructive coronary artery disease by remote coronary angiography, moderate chronic kidney disease, obstructive sleep apnea, morbid obesity, type 2 diabetes mellitus complicated by blindness and severe peripheral neuropathy and recurrent poor wound healing, hyperlipidemia, hypertension who presents for pacemaker follow-up. It is important to note that he is blind and therefore requires assistance with transportation (uses [facility]) and has his medications prepacked in daily blister packs, prepared once weekly on Saturdays.  He is here for a pacemaker check and clinical follow-up.   He has had little change clinically, but has lost quite a bit of weight since I last saw him in October. He is now 24 pounds lighter. He continues to have edema, despite this. He denies chest pain and is extremely sedentary. He uses CPAP very inconsistently.  He denies problems with syncope, palpitations, orthopnea, PND. He does not have intermittent claudication but really doesn't walk. He complains of severe neuropathy which she describes as "frostbite" in his hands and feet.  We ordered labs today. His creatinine is 2.47 which is premature is baseline, his TSH is normal, his electrolytes and lipid profile are okay. Hemoglobin A1c was 7%  His device was implanted for symptomatic sinus bradycardia in 2005, with a generator change in 2014.   Pacemaker interrogation today shows that he has 88% atrial pacing and only 12% ventricular  pacing. When he has native atrial beats the AV conduction time is very long at 340 ms, but following atrial paced beats the conduction time is exceedingly prolonged at 580 ms. His underlying rhythm is sinus at 55-60 bpm so he may be often pacing just above his intrinsic heart.  Has been no atrial fibrillation since his last check. He had a lengthy episode of paroxysmal atrial tachycardia in November 2017, none since then.  His ventricular threshold is a little higher at 2.5 V at 1.0 ms pulse width. Other lead parameters remain acceptable.  Despite his history of atrial fibrillation and stroke he is unable to receive anticoagulation due to a previous left parenchymal temporoparietal hematoma that required surgical evacuation. 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   Past Medical History:  Diagnosis Date  . Arthritis    "all over" (06/23/2013)  . Atrial fibrillation ([place])    not Candidate for Coumadin d/t rectal bleeding  . Benign prostatic hypertrophy   . Cardiomyopathy, hypertensive ([place])     diastolic dysfunction by 7048 echo  . Coronary artery disease     nonobstructive. 2 vessel. cardiac catheter in March 2005 showing 60% LAD occlusion, 30% circumflex occlusion.  . Degenerative joint disease     Right knee.  . Diabetes type 2, uncontrolled ([place])   . Diabetic peripheral neuropathy ([place])   . Diabetic ulcer of thigh ([place])    H/O Rt thigh ulcer.  . Exertional shortness of breath    "comes and goes" (06/23/2013)  .  GERD (gastroesophageal reflux disease)   . Gout   . Herpes   . Hyperlipidemia   . Hypertension   . Hypotension 09/05/2013  . Incarcerated ventral hernia    H/O. S/P repair ventral hernia repair 07/2005.  Marland Kitchen LBBB (left bundle branch block)   . Legally blind   . Morbid obesity ([place])   . Obstructive sleep apnea    On CPAP. Last sleep study (06/2009) - Severe obstructive sleep apnea/hypopnea  syndrome, apnea-hypopnea index 70.5 per hour with non positional events moderately loud snoring and oxygen desaturation to a nadir of 85% on room air.  . Occult blood positive stool     History of in August 2007.  colonoscopy in January 2008 showing normal colon with fair inadequate prep. By Dr. Silvano Rusk.  . Pacemaker 12-07-03    secondary to bradycardia  . Sepsis ([place]) 08/26/2013    Past Surgical History:  Procedure Laterality Date  . AMPUTATION Left 06/22/2013   Procedure: AMPUTATION FOOT;  Surgeon: Newt Minion, MD;  Location: [facility];  Service: Orthopedics;  Laterality: Left;  Left Midfoot Amputation  . CARDIAC CATHETERIZATION  10/22/2003   Dilatation of the LV with preserved LV systolic function. Tortous aorta, but no AAA. Moderate disease with 50-60% area of narrowing in the circumflex after the second OM and 70% mid narrowing in the LAD. No intervention.  Marland Kitchen CARDIOVASCULAR STRESS TEST  01/23/2010   Perfusion defect seen in inferior myocardial region is consistent with diaphragmatic attenuation. Remaining myocardium demonstrates normal myocardial perfusion with no evidence of ischemia or infarct. Stress ECG was non-diagnostic due to LBBB.  Marland Kitchen CATARACT EXTRACTION, BILATERAL Bilateral   . ESOPHAGOGASTRODUODENOSCOPY N/A 10/12/2013   Procedure: ESOPHAGOGASTRODUODENOSCOPY (EGD);  Surgeon: Gwenyth Ober, MD;  Location: [facility];  Service: General;  Laterality: N/A;  bedside  . EYE SURGERY    . 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 HERNIA REPAIR    . INSERT / REPLACE / REMOVE PACEMAKER  12-07-03   Secondary to symptomatic bradycardia. DDDR pacemaker. By Dr. Rollene Fare.  Randolm Idol / REPLACE / REMOVE PACEMAKER  12/06/12   "old pacemaker died; had new one put in" (07/23/2013)  . Pacemaker lead revision  12/2003    likely microdislodgment  of right ventricular lead  . PEG PLACEMENT N/A 10/12/2013   Procedure: PERCUTANEOUS ENDOSCOPIC GASTROSTOMY (PEG) PLACEMENT;   Surgeon: Gwenyth Ober, MD;  Location: [facility];  Service: General;  Laterality: N/A;  . PERMANENT PACEMAKER GENERATOR CHANGE N/A 11/03/2012   Procedure: PERMANENT PACEMAKER GENERATOR CHANGE;  Surgeon: Ryan Klein, MD;  Location: [facility];  Service: Cardiovascular;  Laterality: N/A;  . REFRACTIVE SURGERY Bilateral   . TRANSTHORACIC ECHOCARDIOGRAM  01/01/2013   EF 50-55%, ventricular septum-thickness moderately increased  . VENOUS DOPPLER  01/17/2013   No evidence of thrombus or thrombophlebitis. Left GSVs appear enlarged and demonstrate valvular insuffiiciency. Bilateral SSVs appeared patent with no valvular insufficiency seen.  Marland Kitchen VENTRAL HERNIA REPAIR  07/2005    S/P laparotomy, lysis of adhesions, repair of incarcerated ventral hernia with  partial omentectomy.. Performed by Dr. Marlou Starks.    Outpatient Medications Prior to Visit  Medication Sig Dispense Refill  . acetaminophen (TYLENOL) 650 MG CR tablet Take 650 mg by mouth 2 (two) times daily.    Marland Kitchen albuterol (PROVENTIL HFA;VENTOLIN HFA) 108 (90 Base) MCG/ACT inhaler Inhale 2 puffs into the lungs every 6 (six) hours as needed for wheezing or shortness of breath.    . allopurinol (  ZYLOPRIM) 100 MG tablet Take 100 mg by mouth daily. Reported on 12/09/2015    . amiodarone (PACERONE) 200 MG tablet Take 200 mg by mouth daily. Reported on 12/09/2015    . amLODipine (NORVASC) 10 MG tablet Take 10 mg by mouth daily. Reported on 12/09/2015    . aspirin 325 MG tablet Take 325 mg by mouth daily.    Marland Kitchen atorvastatin (LIPITOR) 40 MG tablet Take 1 tablet (40 mg total) by mouth daily. 90 tablet 3  . Cholecalciferol (VITAMIN D-3) 1000 UNITS CAPS Take 1 capsule by mouth daily.    Marland Kitchen gabapentin (NEURONTIN) 300 MG capsule Take 300 mg by mouth 2 (two) times daily.    . hydrALAZINE (APRESOLINE) 25 MG tablet Take 25 mg by mouth 3 (three) times daily.    . Insulin Glargine (TOUJEO SOLOSTAR) 300 UNIT/ML SOPN Inject 125 Units into the skin 2 (two) times daily.     Marland Kitchen  lidocaine (LIDODERM) 5 % Place 1 patch onto the skin daily as needed (back pain). Remove & Discard patch within 12 hours or as directed by MD    . Liraglutide (VICTOZA) 18 MG/3ML SOPN Inject 1.8 mg into the skin daily.    . Melatonin 3 MG CAPS Take 1 capsule by mouth at bedtime as needed (sleep).     . Menthol, Topical Analgesic, (BIOFREEZE EX) Apply 1 application topically daily as needed (pain). Reported on 10/27/2015    . methylcellulose (ARTIFICIAL TEARS) 1 % ophthalmic solution Place 1 drop into both eyes 2 (two) times daily.    . metolazone (ZAROXOLYN) 2.5 MG tablet Take 2.5 mg by mouth 3 (three) times a week. Mondays, Wednesdays, and Fridays    . metoprolol succinate (TOPROL-XL) 100 MG 24 hr tablet Take 100 mg by mouth daily. Take with or immediately following a meal.    . pantoprazole (PROTONIX) 40 MG tablet Take 40 mg by mouth daily.    . potassium chloride SA (K-DUR,KLOR-CON) 20 MEQ tablet Take 20 mEq by mouth as directed. Take 2 tab (33mEq) every morning and 1 tab (20 mEq) every evening    . sennosides-docusate sodium (SENOKOT-S) 8.6-50 MG tablet Take 1 tablet by mouth daily as needed for constipation.    . Skin Protectants, Misc. (EUCERIN) cream Apply 1 application topically 2 (two) times daily.    Marland Kitchen torsemide (DEMADEX) 100 MG tablet Take 1 tablet (100 mg total) by mouth 2 (two) times daily. 60 tablet 6   No facility-administered medications prior to visit.      Allergies:   Patient has no known allergies.   Social History   Social History  . Marital status: Single    Spouse name: N/A  . Number of children: Y  . Years of education: N/A   Occupational History  . disabled    Social History Main Topics  . Smoking status: Former Smoker    Packs/day: 0.00    Years: 0.00    Types: Cigarettes    Quit date: 08/17/2003  . Smokeless tobacco: Never Used     Comment: started at age 74.  only smoked on occ- very rarely.0  . Alcohol use Yes     Comment: 06/23/2013 "drank some; never  had a problem w/it"  . Drug use: No  . Sexual activity: No   Other Topics Concern  . None   Social History Narrative  . None     Family History:  The patient's family history includes Allergies in his sister; Asthma in his brother; Coronary  artery disease in his sister; Heart disease in his sister; Rheum arthritis in his brother.   ROS:   Please see the history of present illness.    ROS All other systems reviewed and are negative.   PHYSICAL EXAM:   VS:  BP 136/70   Pulse 72   Ht 6' (1.829 m)   Wt (!) 329 lb (149.2 kg)   BMI 44.62 kg/m     General: Alert, oriented x3, no distress; morbid obesity interferes with the physical exam, blind Head: no evidence of trauma, PERRL, EOMI, no exophtalmos or lid lag, no myxedema, no xanthelasma; normal ears, nose and oropharynx Neck: normal jugular venous pulsations and no hepatojugular reflux; brisk carotid pulses without delay and no carotid bruits Chest: clear to auscultation, no signs of consolidation by percussion or palpation, normal fremitus, symmetrical and full respiratory excursions, healthy subclavian pacemaker site. Cardiovascular: normal position and quality of the apical impulse, regular rhythm, normal first and second heart sounds, no murmurs, rubs or gallops Abdomen: no tenderness or distention, no masses by palpation, no abnormal pulsatility or arterial bruits, normal bowel sounds, no hepatosplenomegaly Extremities: no clubbing, cyanosis or edema; 2+ radial, ulnar and brachial pulses bilaterally; 2+ right femoral, posterior tibial and dorsalis pedis pulses; 2+ left femoral, posterior tibial and dorsalis pedis pulses; no subclavian or femoral bruits Neurological: grossly nonfocal.  Blind.  Psych: euthymic.   Wt Readings from Last 3 Encounters:  01/05/17 (!) 329 lb (149.2 kg)  05/18/16 (!) 353 lb (160.1 kg)  02/04/16 (!) 354 lb (160.6 kg)      Studies/Labs Reviewed:   EKG:  EKG is ordered today.  The rhythm is atrial  paced, ventricular sensed with a single PVC. He has left axis deviation almost meeting criteria for left anterior fascicular block, both without clearcut Q waves in leads 1 and aVL. He has a QS pattern in leads V1 through V3 Recent Labs: 01/05/2017: ALT 37; BUN 48; Creatinine, Ser 2.47; Potassium 3.6; Sodium 141; TSH 2.010   Lipid Panel    Component Value Date/Time   CHOL 129 01/05/2017 1235   TRIG 110 01/05/2017 1235   HDL 36 (L) 01/05/2017 1235   CHOLHDL 3.6 01/05/2017 1235   CHOLHDL 3.6 10/05/2013 0500   VLDL 30 10/05/2013 0500   LDLCALC 71 01/05/2017 1235    Additional studies/ records that were reviewed today include:  Heart Failure clinic notes    ASSESSMENT:    1. Chronic diastolic CHF (congestive heart failure) ([place])   2. SSS (sick sinus syndrome) ([place])   3. Pacemaker   4. Paroxysmal atrial fibrillation (H[facility])   5. Coronary artery disease involving native coronary artery of native heart without angina pectoris   6. Essential hypertension   7. OSA (obstructive sleep apnea)   8. CKD (chronic kidney disease) stage 3, GFR 30-59 ml/min   9. Morbid obesity ([place])   10. Dyslipidemia   11. Medication management      PLAN:  In order of problems listed above:   1. CHF: As far as I can tell he is clinically euvolemic on the current regimen of 3 times weekly metolazone and 100 mg twice daily torsemide. Electrolytes are okay in kidney function is at baseline. He still has substantial edema but otherwise no clear evidence of heart failure. Respiratory difficulty at rest. 2. SSS: He has almost 90% atrial pacing. Of itself, this is not a problem, but interestingly is atrial paced beats are associated with extremely long and completely nonphysiological AV delay  of almost 600 ms. Atrial sensed beats have a very long PR interval, but closer to physiological range. We'll try to minimize his atrial pacing by reducing his lower rate limit to 60 bpm and introducing the nighttime rate at 50  bpm 3. PPM: Normal device function, but chronically elevated right ventricular pacing threshold. There are many reasons to avoid ventricular pacing including congestive heart failure and the increased drain on the battery. Thankfully, MVP has managed to prevent ventricular pacing despite very long AV delay. We'll try again to set up a remote download in 3 months, but his blindness has made this a challenge Pacemaker check in the office scheduled for 6 months. 4. PAF: [place] high embolic risk not be anticoagulated due to history of intracranial hemorrhage. No atrial fibrillation has been recorded since his last device check. 5. CAD: Asymptomatic. Had nonobstructive lesions by last coronary angiogram 6. HTN: Well controlled 7. OSA: I'm not sure why he is not always using his CPAP. I told him just use it 100% of the time. 8. CKD: Followed at [facility]. Stable renal function on labs today. 9. Obesity: His obesity is definitely playing a big part in his heart failure and sleep apnea. 10. HLP: Labs checked today show that all his lipid parameters are at or very close to target range. HDL will not improve without substantial weight loss    Medication Adjustments/Labs and Tests Ordered: Current medicines are reviewed at length with the patient today.  Concerns regarding medicines are outlined above.  Medication changes, Labs and Tests ordered today are listed in the Patient Instructions below. Patient Instructions  Dr Sallyanne Kuster recommends that you continue on your current medications as directed. Please refer to the Current Medication list given to you today.  Your physician recommends that you return for lab work TODAY.  Remote monitoring is used to monitor your Pacemaker of ICD from home. This monitoring reduces the number of office visits required to check your device to one time per year. It allows [place] to keep an eye on the functioning of your device to ensure it is working properly. You are  scheduled for a device check from home on Wednesday, August 22nd, 2018. You may send your transmission at any time that day. If you have a wireless device, the transmission will be sent automatically. After your physician reviews your transmission, you will receive a postcard with your next transmission date.  Dr Sallyanne Kuster recommends that you schedule a follow-up appointment in 6 months with a pacemaker check. You will receive a reminder letter in the mail two months in advance. If you don't receive a letter, please call our office to schedule the follow-up appointment.  If you need a refill on your cardiac medications before your next appointment, please call your pharmacy.      Signed, Ryan Klein, MD  01/06/2017 6:41 PM    [facility] [address], [place], [place]  [address] Phone: [phone]; Fax: [phone]

## 2017-01-05 ENCOUNTER — Other Ambulatory Visit: Payer: Self-pay | Admitting: Cardiovascular Disease

## 2017-01-05 ENCOUNTER — Ambulatory Visit (INDEPENDENT_AMBULATORY_CARE_PROVIDER_SITE_OTHER): Payer: Medicare (Managed Care) | Admitting: Cardiovascular Disease

## 2017-01-05 ENCOUNTER — Encounter: Payer: Self-pay | Admitting: Cardiovascular Disease

## 2017-01-05 VITALS — BP 136/70 | HR 72 | Ht 72.0 in | Wt 329.0 lb

## 2017-01-05 DIAGNOSIS — Z950 Presence of cardiac pacemaker: Secondary | ICD-10-CM

## 2017-01-05 DIAGNOSIS — E785 Hyperlipidemia, unspecified: Secondary | ICD-10-CM

## 2017-01-05 DIAGNOSIS — N183 Chronic kidney disease, stage 3 unspecified: Secondary | ICD-10-CM

## 2017-01-05 DIAGNOSIS — Z79899 Other long term (current) drug therapy: Secondary | ICD-10-CM

## 2017-01-05 DIAGNOSIS — I2510 Atherosclerotic heart disease of native coronary artery without angina pectoris: Secondary | ICD-10-CM

## 2017-01-05 DIAGNOSIS — I495 Sick sinus syndrome: Secondary | ICD-10-CM | POA: Diagnosis not present

## 2017-01-05 DIAGNOSIS — I10 Essential (primary) hypertension: Secondary | ICD-10-CM

## 2017-01-05 DIAGNOSIS — I5032 Chronic diastolic (congestive) heart failure: Secondary | ICD-10-CM

## 2017-01-05 DIAGNOSIS — I480 Paroxysmal atrial fibrillation: Secondary | ICD-10-CM

## 2017-01-05 DIAGNOSIS — G4733 Obstructive sleep apnea (adult) (pediatric): Secondary | ICD-10-CM

## 2017-01-05 LAB — CUP PACEART INCLINIC DEVICE CHECK
Brady Statistic AP VS Percent: 87 %
Brady Statistic AS VS Percent: 0 %
Date Time Interrogation Session: 20180523105124
Implantable Lead Implant Date: 20050310
Implantable Lead Location: 753859
Implantable Lead Location: 753860
Implantable Lead Model: 5092
Lead Channel Impedance Value: 423 Ohm
Lead Channel Impedance Value: 726 Ohm
Lead Channel Pacing Threshold Pulse Width: 1 ms
Lead Channel Sensing Intrinsic Amplitude: 15.67 mV
Lead Channel Setting Pacing Amplitude: 2 V
Lead Channel Setting Pacing Amplitude: 4 V
Lead Channel Setting Pacing Pulse Width: 1 ms
Lead Channel Setting Sensing Sensitivity: 5.6 mV
MDC IDC LEAD IMPLANT DT: 20050301
MDC IDC MSMT BATTERY IMPEDANCE: 320 Ohm
MDC IDC MSMT BATTERY REMAINING LONGEVITY: 85 mo
MDC IDC MSMT BATTERY VOLTAGE: 2.79 V
MDC IDC MSMT LEADCHNL RA PACING THRESHOLD AMPLITUDE: 0.75 V
MDC IDC MSMT LEADCHNL RA PACING THRESHOLD AMPLITUDE: 0.75 V
MDC IDC MSMT LEADCHNL RA PACING THRESHOLD PULSEWIDTH: 0.4 ms
MDC IDC MSMT LEADCHNL RA PACING THRESHOLD PULSEWIDTH: 0.4 ms
MDC IDC MSMT LEADCHNL RA SENSING INTR AMPL: 4 mV
MDC IDC MSMT LEADCHNL RV PACING THRESHOLD AMPLITUDE: 2.5 V
MDC IDC PG IMPLANT DT: 20140321
MDC IDC STAT BRADY AP VP PERCENT: 12 %
MDC IDC STAT BRADY AS VP PERCENT: 0 %

## 2017-01-05 LAB — LIPID PANEL
CHOLESTEROL TOTAL: 129 mg/dL (ref 100–199)
Chol/HDL Ratio: 3.6 ratio (ref 0.0–5.0)
HDL: 36 mg/dL — ABNORMAL LOW (ref >39)
LDL Calculated: 71 mg/dL (ref 0–99)
Triglycerides: 110 mg/dL (ref 0–149)
VLDL Cholesterol Cal: 22 mg/dL (ref 5–40)

## 2017-01-05 LAB — COMPREHENSIVE METABOLIC PANEL
ALK PHOS: 111 IU/L (ref 39–117)
ALT: 37 IU/L (ref 0–44)
AST: 26 IU/L (ref 0–40)
Albumin/Globulin Ratio: 1.2 (ref 1.2–2.2)
Albumin: 4 g/dL (ref 3.6–4.8)
BUN/Creatinine Ratio: 19 (ref 10–24)
BUN: 48 mg/dL — ABNORMAL HIGH (ref 8–27)
Bilirubin Total: 0.6 mg/dL (ref 0.0–1.2)
CO2: 30 mmol/L — ABNORMAL HIGH (ref 18–29)
Calcium: 9.5 mg/dL (ref 8.6–10.2)
Chloride: 93 mmol/L — ABNORMAL LOW (ref 96–106)
Creatinine, Ser: 2.47 mg/dL — ABNORMAL HIGH (ref 0.76–1.27)
GFR calc Af Amer: 30 mL/min/{1.73_m2} — ABNORMAL LOW (ref >59)
GFR calc non Af Amer: 26 mL/min/{1.73_m2} — ABNORMAL LOW (ref >59)
GLOBULIN, TOTAL: 3.4 g/dL (ref 1.5–4.5)
Glucose: 105 mg/dL — ABNORMAL HIGH (ref 65–99)
POTASSIUM: 3.6 mmol/L (ref 3.5–5.2)
SODIUM: 141 mmol/L (ref 134–144)
Total Protein: 7.4 g/dL (ref 6.0–8.5)

## 2017-01-05 LAB — TSH: TSH: 2.01 u[IU]/mL (ref 0.450–4.500)

## 2017-01-05 NOTE — Patient Instructions (Addendum)
Dr Sallyanne Kuster recommends that you continue on your current medications as directed. Please refer to the Current Medication list given to you today.  Your physician recommends that you return for lab work TODAY.  Remote monitoring is used to monitor your Pacemaker of ICD from home. This monitoring reduces the number of office visits required to check your device to one time per year. It allows [place] to keep an eye on the functioning of your device to ensure it is working properly. You are scheduled for a device check from home on Wednesday, August 22nd, 2018. You may send your transmission at any time that day. If you have a wireless device, the transmission will be sent automatically. After your physician reviews your transmission, you will receive a postcard with your next transmission date.  Dr Sallyanne Kuster recommends that you schedule a follow-up appointment in 6 months with a pacemaker check. You will receive a reminder letter in the mail two months in advance. If you don't receive a letter, please call our office to schedule the follow-up appointment.  If you need a refill on your cardiac medications before your next appointment, please call your pharmacy.

## 2017-01-06 LAB — HEMOGLOBIN A1C
Est. average glucose Bld gHb Est-mCnc: 154 mg/dL
HEMOGLOBIN A1C: 7 % — AB (ref 4.8–5.6)

## 2017-01-06 LAB — T4, FREE: FREE T4: 1.7 ng/dL (ref 0.82–1.77)

## 2017-01-07 LAB — COMPREHENSIVE METABOLIC PANEL

## 2017-01-07 LAB — T4, FREE

## 2017-01-07 LAB — TSH

## 2017-01-07 LAB — HEMOGLOBIN A1C

## 2017-01-07 LAB — LIPID PANEL

## 2017-01-25 ENCOUNTER — Other Ambulatory Visit: Payer: Self-pay | Admitting: Nurse Practitioner

## 2017-01-25 ENCOUNTER — Ambulatory Visit
Admission: RE | Admit: 2017-01-25 | Discharge: 2017-01-25 | Disposition: A | Discharge status: Home / Self Care | Payer: Medicare (Managed Care) | Source: Ambulatory Visit | Attending: Nurse Practitioner | Admitting: Nurse Practitioner

## 2017-01-25 DIAGNOSIS — R0602 Shortness of breath: Secondary | ICD-10-CM

## 2017-02-09 ENCOUNTER — Ambulatory Visit (HOSPITAL_COMMUNITY)
Admission: RE | Admit: 2017-02-09 | Discharge: 2017-02-09 | Disposition: A | Discharge status: Home / Self Care | Payer: Medicare (Managed Care) | Source: Ambulatory Visit | Attending: Student | Admitting: Student

## 2017-02-09 ENCOUNTER — Telehealth (HOSPITAL_COMMUNITY): Payer: Self-pay

## 2017-02-09 ENCOUNTER — Ambulatory Visit (HOSPITAL_COMMUNITY)
Admission: RE | Admit: 2017-02-09 | Discharge: 2017-02-09 | Disposition: A | Discharge status: Home / Self Care | Payer: Medicare (Managed Care) | Source: Ambulatory Visit | Attending: Internal Medicine | Admitting: Internal Medicine

## 2017-02-09 ENCOUNTER — Encounter (HOSPITAL_COMMUNITY): Payer: Self-pay

## 2017-02-09 VITALS — BP 122/64 | HR 74 | Wt 333.6 lb

## 2017-02-09 DIAGNOSIS — Z825 Family history of asthma and other chronic lower respiratory diseases: Secondary | ICD-10-CM | POA: Diagnosis not present

## 2017-02-09 DIAGNOSIS — R059 Cough, unspecified: Secondary | ICD-10-CM

## 2017-02-09 DIAGNOSIS — I350 Nonrheumatic aortic (valve) stenosis: Secondary | ICD-10-CM | POA: Diagnosis not present

## 2017-02-09 DIAGNOSIS — I130 Hypertensive heart and chronic kidney disease with heart failure and stage 1 through stage 4 chronic kidney disease, or unspecified chronic kidney disease: Secondary | ICD-10-CM | POA: Diagnosis not present

## 2017-02-09 DIAGNOSIS — E1122 Type 2 diabetes mellitus with diabetic chronic kidney disease: Secondary | ICD-10-CM | POA: Diagnosis not present

## 2017-02-09 DIAGNOSIS — G4733 Obstructive sleep apnea (adult) (pediatric): Secondary | ICD-10-CM

## 2017-02-09 DIAGNOSIS — I5032 Chronic diastolic (congestive) heart failure: Secondary | ICD-10-CM

## 2017-02-09 DIAGNOSIS — Z7982 Long term (current) use of aspirin: Secondary | ICD-10-CM | POA: Diagnosis not present

## 2017-02-09 DIAGNOSIS — H3530 Unspecified macular degeneration: Secondary | ICD-10-CM | POA: Diagnosis not present

## 2017-02-09 DIAGNOSIS — Z87891 Personal history of nicotine dependence: Secondary | ICD-10-CM | POA: Insufficient documentation

## 2017-02-09 DIAGNOSIS — I2510 Atherosclerotic heart disease of native coronary artery without angina pectoris: Secondary | ICD-10-CM | POA: Diagnosis not present

## 2017-02-09 DIAGNOSIS — Z794 Long term (current) use of insulin: Secondary | ICD-10-CM | POA: Insufficient documentation

## 2017-02-09 DIAGNOSIS — R05 Cough: Secondary | ICD-10-CM | POA: Insufficient documentation

## 2017-02-09 DIAGNOSIS — Z8249 Family history of ischemic heart disease and other diseases of the circulatory system: Secondary | ICD-10-CM | POA: Diagnosis not present

## 2017-02-09 DIAGNOSIS — N183 Chronic kidney disease, stage 3 unspecified: Secondary | ICD-10-CM

## 2017-02-09 DIAGNOSIS — R001 Bradycardia, unspecified: Secondary | ICD-10-CM | POA: Insufficient documentation

## 2017-02-09 DIAGNOSIS — Z8261 Family history of arthritis: Secondary | ICD-10-CM | POA: Insufficient documentation

## 2017-02-09 DIAGNOSIS — J069 Acute upper respiratory infection, unspecified: Secondary | ICD-10-CM | POA: Insufficient documentation

## 2017-02-09 DIAGNOSIS — I509 Heart failure, unspecified: Secondary | ICD-10-CM | POA: Insufficient documentation

## 2017-02-09 DIAGNOSIS — R918 Other nonspecific abnormal finding of lung field: Secondary | ICD-10-CM | POA: Insufficient documentation

## 2017-02-09 DIAGNOSIS — Z79899 Other long term (current) drug therapy: Secondary | ICD-10-CM | POA: Insufficient documentation

## 2017-02-09 DIAGNOSIS — I480 Paroxysmal atrial fibrillation: Secondary | ICD-10-CM | POA: Insufficient documentation

## 2017-02-09 DIAGNOSIS — I10 Essential (primary) hypertension: Secondary | ICD-10-CM | POA: Diagnosis not present

## 2017-02-09 LAB — BASIC METABOLIC PANEL
Anion gap: 11 (ref 5–15)
BUN: 61 mg/dL — ABNORMAL HIGH (ref 6–20)
CHLORIDE: 93 mmol/L — AB (ref 101–111)
CO2: 32 mmol/L (ref 22–32)
Calcium: 9 mg/dL (ref 8.9–10.3)
Creatinine, Ser: 2.24 mg/dL — ABNORMAL HIGH (ref 0.61–1.24)
GFR calc non Af Amer: 28 mL/min — ABNORMAL LOW (ref >60)
GFR, EST AFRICAN AMERICAN: 33 mL/min — AB (ref >60)
Glucose, Bld: 134 mg/dL — ABNORMAL HIGH (ref 65–99)
POTASSIUM: 3.3 mmol/L — AB (ref 3.5–5.1)
SODIUM: 136 mmol/L (ref 135–145)

## 2017-02-09 LAB — CBC
HEMATOCRIT: 46 % (ref 39.0–52.0)
HEMOGLOBIN: 14.3 g/dL (ref 13.0–17.0)
MCH: 26.3 pg (ref 26.0–34.0)
MCHC: 31.1 g/dL (ref 30.0–36.0)
MCV: 84.6 fL (ref 78.0–100.0)
Platelets: 166 10*3/uL (ref 150–400)
RBC: 5.44 MIL/uL (ref 4.22–5.81)
RDW: 16 % — AB (ref 11.5–15.5)
WBC: 11.1 10*3/uL — AB (ref 4.0–10.5)

## 2017-02-09 LAB — BRAIN NATRIURETIC PEPTIDE: B NATRIURETIC PEPTIDE 5: 47.5 pg/mL (ref 0.0–100.0)

## 2017-02-09 NOTE — Progress Notes (Signed)
Advanced Heart Failure Medication Review by a Pharmacist  Does the patient  feel that his/her medications are working for him/her?  yes  Has the patient been experiencing any side effects to the medications prescribed?  Pt complains of tremors in hands  Does the patient measure his/her own blood pressure or blood glucose at home?  yes   Does the patient have any problems obtaining medications due to transportation or finances?   no  Understanding of regimen: fair Understanding of indications: fair Potential of compliance: fair Patient understands to avoid NSAIDs. Patient understands to avoid decongestants.  Issues to address at subsequent visits: none   Pharmacist comments: [name] is a pleasant 75 man presenting with escort from [facility] and med list (adult day care, does not administer meds but fills all meds and packs for him). Changes to medication list include toujeo decreased to 80 BID, d/c allopurinol, add uloric. Of note, patient is completely blind. Patient states that he is able to give himself all of his own medication without issue. No other issues at this time  Carlean Jews, Pharm.D. PGY1 Pharmacy Resident 6/27/201811:36 AM Pager [phone]  Time with patient: 15 mins Preparation and documentation time: 3 mins Total time: 18 mins

## 2017-02-09 NOTE — Telephone Encounter (Signed)
Called to confirm patient with appointment who was listed as a post hospital visit.  Patient states he is not post hospital and has not been in a hospital recently.  Renee Pain, RN

## 2017-02-09 NOTE — Patient Instructions (Signed)
Routine lab work today. Will notify you of abnormal results, otherwise no news is good news!  No changes to medication at this time.  Chest xray today to evaluate cough. We will call you by end of week with results (critical results will be called back within 24 hours).  Follow up 4 weeks with Oda Kilts PA-C and echocardiogram. Take all medication as prescribed the day of your appointment. Bring all medications with you to your appointment.  Do the following things EVERYDAY: 1) Weigh yourself in the morning before breakfast. Write it down and keep it in a log. 2) Take your medicines as prescribed 3) Eat low salt foods-Limit salt (sodium) to 2000 mg per day.  4) Stay as active as you can everyday 5) Limit all fluids for the day to less than 2 liters

## 2017-02-09 NOTE — Progress Notes (Signed)
Patient ID: [name], male   DOB: Dec 06, 1945, 71 y.o.   MRN: [id number]   PCP: [name] Cardiology: Dr. Sallyanne Kuster HF Cardiology: Dr. Aundra Dubin Nephrologist: Dr. Merlene Morse [place] is a 71 y.o. 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   Pt presents today for regular follow up. Last seen in HF clinic 01/2016. Feeling OK overall. Has been having trouble with his breathing lately.  Has been having wheezing. Seen by PCP and given extra lasix and mucinex.   Denies fevers or chills. Initially felt better but feeling worse again. Not able to be very active due to his blindness. Occasionally rides a stationary bike at [facility]. [facility]. Lives at home. Aides check on him multiple times every day. Meds provided in blister pack. Wearing CPAP every night. SOB walking around the house, as well as changing clothes or bathing. Slightly productive cough. Unsure if any color (Blind). No N/V or diarrhea.   PMH: 1. Chronic diastolic CHF with RV dysfunction: Echo (2/15) with EF 60%, mild LVH, mild aortic stenosis with mean gradient 18 mmHg, RV mildly dilated with mildly decreased systolic function.  2. Aortic stenosis: Mild.  3. Type II diabetes 4. LBBB 5. OSA: Uses CPAP.  6. Morbid obesity.  7. HTN 8. CAD: Nonobstructive.  [place] 2005 with 60% LAD stenosis. 9. 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  10. Atrial fibrillation: Paroxysmal.  None recently.   11. CKD stage III 12. Macular degeneration: Legally blind.  13. 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  14. 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 15. S/p left foot amputation 16. Diabetic peripheral neuropathy.  17. ECHO EF 55-60%. Mild-mod AS  Social History   Social History  . Marital status: Single    Spouse name: N/A  . Number of children: Y  . Years of  education: N/A   Occupational History  . disabled    Social History Main Topics  . Smoking status: Former Smoker    Packs/day: 0.00    Years: 0.00    Types: Cigarettes    Quit date: 08/17/2003  . Smokeless tobacco: Never Used     Comment: started at age 11.  only smoked on occ- very rarely.0  . Alcohol use Yes     Comment: 06/23/2013 "drank some; never had a problem w/it"  . Drug use: No  . Sexual activity: No   Other Topics Concern  . Not on file   Social History Narrative  . No narrative on file    Family History  Problem Relation Age of Onset  . Coronary artery disease Sister   . Heart disease Sister   . Allergies Sister   . Asthma Brother   . Rheum arthritis Brother    Review of systems complete and found to be negative unless listed in HPI.    Current Outpatient Prescriptions  Medication Sig Dispense Refill  . acetaminophen (TYLENOL) 650 MG CR tablet Take 650 mg by mouth 2 (two) times daily.    Marland Kitchen albuterol (PROVENTIL HFA;VENTOLIN HFA) 108 (90 Base) MCG/ACT inhaler Inhale 2 puffs into the lungs every 6 (six) hours as needed for wheezing or shortness of breath.    Marland Kitchen amiodarone (PACERONE) 200 MG tablet Take 200 mg by mouth daily. Reported on 12/09/2015    . amLODipine (NORVASC) 10 MG tablet Take 10 mg by mouth daily. Reported on  12/09/2015    . aspirin 325 MG tablet Take 325 mg by mouth daily.    Marland Kitchen atorvastatin (LIPITOR) 40 MG tablet Take 1 tablet (40 mg total) by mouth daily. 90 tablet 3  . Cholecalciferol (VITAMIN D-3) 1000 UNITS CAPS Take 1 capsule by mouth daily.    . citalopram (CELEXA) 20 MG tablet Take 20 mg by mouth daily.    . colchicine 0.6 MG tablet Take 0.6 mg by mouth daily.    . febuxostat (ULORIC) 40 MG tablet Take 80 mg by mouth daily.    Marland Kitchen gabapentin (NEURONTIN) 300 MG capsule Take 300 mg by mouth 2 (two) times daily.    . hydrALAZINE (APRESOLINE) 25 MG tablet Take 25 mg by mouth 3 (three) times daily.    . Insulin Glargine (TOUJEO SOLOSTAR) 300 UNIT/ML  SOPN Inject 80 Units into the skin 2 (two) times daily.     Marland Kitchen lidocaine (LIDODERM) 5 % Place 1 patch onto the skin daily as needed (back pain). Remove & Discard patch within 12 hours or as directed by MD    . Liraglutide (VICTOZA) 18 MG/3ML SOPN Inject 1.8 mg into the skin daily.    . Melatonin 3 MG CAPS Take 1 capsule by mouth at bedtime as needed (sleep).     . Menthol, Topical Analgesic, (BIOFREEZE EX) Apply 1 application topically daily as needed (pain). Reported on 10/27/2015    . methylcellulose (ARTIFICIAL TEARS) 1 % ophthalmic solution Place 1 drop into both eyes 2 (two) times daily.    . metolazone (ZAROXOLYN) 2.5 MG tablet Take 2.5 mg by mouth 3 (three) times a week. Mondays, Wednesdays, and Fridays    . metoprolol succinate (TOPROL-XL) 100 MG 24 hr tablet Take 100 mg by mouth daily. Take with or immediately following a meal.    . pantoprazole (PROTONIX) 40 MG tablet Take 40 mg by mouth daily.    . potassium chloride SA (K-DUR,KLOR-CON) 20 MEQ tablet Take 20 mEq by mouth as directed. Take 2 tab (76mEq) every morning and 1 tab (20 mEq) every evening    . sennosides-docusate sodium (SENOKOT-S) 8.6-50 MG tablet Take 1 tablet by mouth daily as needed for constipation.    . Skin Protectants, Misc. (EUCERIN) cream Apply 1 application topically 2 (two) times daily.    Marland Kitchen torsemide (DEMADEX) 100 MG tablet Take 1 tablet (100 mg total) by mouth 2 (two) times daily. 60 tablet 6   No current facility-administered medications for this encounter.    BP 122/64 (BP Location: Left Arm, Patient Position: Sitting, Cuff Size: Normal)   Pulse 74   Wt (!) 333 lb 9.6 oz (151.3 kg)   SpO2 (!) 89%   BMI 45.24 kg/m    Wt Readings from Last 3 Encounters:  02/09/17 (!) 333 lb 9.6 oz (151.3 kg)  01/05/17 (!) 329 lb (149.2 kg)  05/18/16 (!) 353 lb (160.1 kg)    General: Elderly appearing. NAD  HEENT: Normal except for blindness. Neck: Supple. JVP difficult due to body habitus. Carotids 2+ bilat; no bruits.  No thyromegaly or nodule noted. Cor: PMI nondisplaced. RRR, 2/6 early SEM RUSB with clear S2.  Lungs: Diminished throughout with scattered rhonchi that clear with cough. Audible wheezing.  Abdomen: Soft, non-tender, non-distended, no HSM. No bruits or masses. +BS  Extremities: No cyanosis, clubbing, or rash. 1+ chronic edema. Neuro: Alert & orientedx3, cranial nerves grossly intact. moves all 4 extremities w/o difficulty. Affect pleasant   Assessment/Plan: 1. Chronic diastolic CHF with prominent RV failure:  -  Echo 10/2015 LVEF 60-65%, Mild/Mod AS, Mean gradient 13 mm Hg, Mild LAE, RV mildly dilated, Mild RAE, No TR jet. - NYHA class III-IIIB. Will repeat Echo to make sure EF has not dropped with long standing HTN.  - Volume status looks OK on exam. Think dyspnea more related to URI.  - Continue torsemide 100 mg BID - Continue metolazone twice weekly.  - Reinforced fluid restriction to < 2 L daily, sodium restriction to less than 2000 mg daily, and the importance of daily weights.   2. Aortic stenosis: Mild-Mod on ECHO March 2017.    - Re-evaluate with repeat Echo.   3. Atrial fibrillation: Paroxysmal. - EKG 12/2016 with A pacing, V sensing and long AV delay + PVC - Last episode was in 10/14 based on device interrogation.  He is not anticoagulated (takes ASA) due to prior intracerebral hemorrhage.  - Regular on exam.  4. CAD: Nonobstructive. - No chest pain. Continue ASA and statin.   5. Bradycardia s/p Medtronic PPM  - Stable.  6. CKD: Stage III - Follows at [facility].       7. OSA:  - Continue nightly CPAP 8. Cough/URI - Recently treated with increased lasix and Mucinex. Think this is more of a pulmonary process. Will check CXR and likely give ABX. Pt thought [facility] got [facility] from [facility], but we confirmed and he actually got an extra dose of lasix and mucinex.   OK from HF perspective. Labs today. URI symptoms. CXR today. Follow up 4 weeks. Encouraged close PCP follow up.    ADDENDUM CXR reviewed personally. CHF imposed on chronic bronchitic changes with slight worsening. Will treat with Doxycyline x 7 days. Needs to follow up with PCP.   Shirley Friar, PA-C  02/09/2017  Greater than 50% of the 45 minute visit was spent in counseling/coordination of care regarding disease state education, imaging interpretation, medication reconciliation, discussion of medical regimen with on-site Pharm-D, and salt/fluid restriction.

## 2017-03-23 ENCOUNTER — Ambulatory Visit (HOSPITAL_COMMUNITY)
Admission: RE | Admit: 2017-03-23 | Discharge: 2017-03-23 | Disposition: A | Discharge status: Home / Self Care | Payer: Medicare (Managed Care) | Source: Ambulatory Visit | Attending: Family Medicine | Admitting: Family Medicine

## 2017-03-23 ENCOUNTER — Ambulatory Visit (HOSPITAL_BASED_OUTPATIENT_CLINIC_OR_DEPARTMENT_OTHER)
Admission: RE | Admit: 2017-03-23 | Discharge: 2017-03-23 | Disposition: A | Discharge status: Home / Self Care | Payer: Medicare (Managed Care) | Source: Ambulatory Visit | Attending: Cardiology | Admitting: Cardiology

## 2017-03-23 VITALS — BP 118/74 | HR 83 | Wt 339.0 lb

## 2017-03-23 DIAGNOSIS — I480 Paroxysmal atrial fibrillation: Secondary | ICD-10-CM | POA: Diagnosis not present

## 2017-03-23 DIAGNOSIS — N189 Chronic kidney disease, unspecified: Secondary | ICD-10-CM | POA: Diagnosis not present

## 2017-03-23 DIAGNOSIS — I10 Essential (primary) hypertension: Secondary | ICD-10-CM

## 2017-03-23 DIAGNOSIS — N183 Chronic kidney disease, stage 3 unspecified: Secondary | ICD-10-CM

## 2017-03-23 DIAGNOSIS — I350 Nonrheumatic aortic (valve) stenosis: Secondary | ICD-10-CM | POA: Diagnosis not present

## 2017-03-23 DIAGNOSIS — I130 Hypertensive heart and chronic kidney disease with heart failure and stage 1 through stage 4 chronic kidney disease, or unspecified chronic kidney disease: Secondary | ICD-10-CM | POA: Insufficient documentation

## 2017-03-23 DIAGNOSIS — I2510 Atherosclerotic heart disease of native coronary artery without angina pectoris: Secondary | ICD-10-CM | POA: Diagnosis not present

## 2017-03-23 DIAGNOSIS — I5032 Chronic diastolic (congestive) heart failure: Secondary | ICD-10-CM

## 2017-03-23 DIAGNOSIS — G4733 Obstructive sleep apnea (adult) (pediatric): Secondary | ICD-10-CM | POA: Diagnosis not present

## 2017-03-23 DIAGNOSIS — E1122 Type 2 diabetes mellitus with diabetic chronic kidney disease: Secondary | ICD-10-CM | POA: Diagnosis not present

## 2017-03-23 LAB — CBC
HCT: 49.1 % (ref 39.0–52.0)
HEMOGLOBIN: 15.6 g/dL (ref 13.0–17.0)
MCH: 26.4 pg (ref 26.0–34.0)
MCHC: 31.8 g/dL (ref 30.0–36.0)
MCV: 82.9 fL (ref 78.0–100.0)
Platelets: 145 10*3/uL — ABNORMAL LOW (ref 150–400)
RBC: 5.92 MIL/uL — AB (ref 4.22–5.81)
RDW: 15.5 % (ref 11.5–15.5)
WBC: 9.7 10*3/uL (ref 4.0–10.5)

## 2017-03-23 LAB — BASIC METABOLIC PANEL
ANION GAP: 13 (ref 5–15)
BUN: 36 mg/dL — AB (ref 6–20)
CALCIUM: 9.3 mg/dL (ref 8.9–10.3)
CO2: 32 mmol/L (ref 22–32)
Chloride: 95 mmol/L — ABNORMAL LOW (ref 101–111)
Creatinine, Ser: 2.14 mg/dL — ABNORMAL HIGH (ref 0.61–1.24)
GFR calc Af Amer: 35 mL/min — ABNORMAL LOW (ref >60)
GFR, EST NON AFRICAN AMERICAN: 30 mL/min — AB (ref >60)
GLUCOSE: 68 mg/dL (ref 65–99)
POTASSIUM: 3.6 mmol/L (ref 3.5–5.1)
Sodium: 140 mmol/L (ref 135–145)

## 2017-03-23 LAB — BRAIN NATRIURETIC PEPTIDE: B Natriuretic Peptide: 78 pg/mL (ref 0.0–100.0)

## 2017-03-23 NOTE — Patient Instructions (Signed)
Routine lab work today. Will notify you of abnormal results, otherwise no news is good news!  No changes to medication at this time.  Follow up 2 months with Dr. Aundra Dubin.  Take all medication as prescribed the day of your appointment. Bring all medications with you to your appointment.  Do the following things EVERYDAY: 1) Weigh yourself in the morning before breakfast. Write it down and keep it in a log. 2) Take your medicines as prescribed 3) Eat low salt foods-Limit salt (sodium) to 2000 mg per day.  4) Stay as active as you can everyday 5) Limit all fluids for the day to less than 2 liters

## 2017-03-23 NOTE — Progress Notes (Signed)
  Echocardiogram 2D Echocardiogram has been performed.  Darlina Sicilian M 03/23/2017, 9:46 AM

## 2017-03-23 NOTE — Progress Notes (Signed)
Patient ID: Mckade Gurka, male   DOB: 1945-12-23, 71 y.o.   MRN: [id number]   PCP: [name] Cardiology: Dr. Sallyanne Kuster HF Cardiology: Dr. Aundra Dubin Nephrologist: Dr. Merlene Morse [place] is a 71 y.o. 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   Pt presents today for regular follow up. Feeling good overall. Cough from last visit resolved with ABX course. Walks around outside with the help of an aide. Feels like he can walk about half a block before he gets SOB. Occasional SOB with bathing and dressing. Wears CPAP everynight. No fevers or chills. No palpitations or CP. Meds provided through [facility] in a bubble pack. He states he drinks a significant amount of fluid every day, was told last week to cut back.   PMH: 1. Chronic diastolic CHF with RV dysfunction: Echo (2/15) with EF 60%, mild LVH, mild aortic stenosis with mean gradient 18 mmHg, RV mildly dilated with mildly decreased systolic function.  2. Aortic stenosis: Mild.  3. Type II diabetes 4. LBBB 5. OSA: Uses CPAP.  6. Morbid obesity.  7. HTN 8. CAD: Nonobstructive.  [place] 2005 with 60% LAD stenosis. 9. 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  10. Atrial fibrillation: Paroxysmal.  None recently.   11. CKD stage III 12. Macular degeneration: Legally blind.  13. 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  14. 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 15. S/p left foot amputation 16. Diabetic peripheral neuropathy.  17. ECHO EF 55-60%. Mild-mod AS  Social History   Social History  . Marital status: Single    Spouse name: N/A  . Number of children: Y  . Years of education: N/A   Occupational History  . disabled    Social History Main Topics  . Smoking status: Former Smoker    Packs/day: 0.00    Years: 0.00    Types: Cigarettes    Quit date: 08/17/2003  . Smokeless  tobacco: Never Used     Comment: started at age 30.  only smoked on occ- very rarely.0  . Alcohol use Yes     Comment: 06/23/2013 "drank some; never had a problem w/it"  . Drug use: No  . Sexual activity: No   Other Topics Concern  . Not on file   Social History Narrative  . No narrative on file    Family History  Problem Relation Age of Onset  . Coronary artery disease Sister   . Heart disease Sister   . Allergies Sister   . Asthma Brother   . Rheum arthritis Brother    Review of systems complete and found to be negative unless listed in HPI.    Current Outpatient Prescriptions  Medication Sig Dispense Refill  . acetaminophen (TYLENOL) 650 MG CR tablet Take 1,300 mg by mouth 2 (two) times daily.     Marland Kitchen albuterol (PROVENTIL HFA;VENTOLIN HFA) 108 (90 Base) MCG/ACT inhaler Inhale 2 puffs into the lungs every 6 (six) hours as needed for wheezing or shortness of breath.    Marland Kitchen amiodarone (PACERONE) 200 MG tablet Take 200 mg by mouth daily. Reported on 12/09/2015    . amLODipine (NORVASC) 10 MG tablet Take 10 mg by mouth daily. Reported on 12/09/2015    . aspirin 325 MG tablet Take 325 mg by mouth daily.    Marland Kitchen atorvastatin (LIPITOR) 40 MG tablet Take 1 tablet (40 mg total) by mouth daily.  90 tablet 3  . Cholecalciferol (VITAMIN D-3) 1000 UNITS CAPS Take 1 capsule by mouth daily.    . citalopram (CELEXA) 20 MG tablet Take 20 mg by mouth daily.    . colchicine 0.6 MG tablet Take 0.3 mg by mouth daily.     . Febuxostat 80 MG TABS Take 80 mg by mouth daily.    Marland Kitchen gabapentin (NEURONTIN) 300 MG capsule Take 300 mg by mouth 2 (two) times daily.    . hydrALAZINE (APRESOLINE) 25 MG tablet Take 25 mg by mouth 3 (three) times daily.    . Insulin Glargine (TOUJEO SOLOSTAR) 300 UNIT/ML SOPN Inject 85 Units into the skin 2 (two) times daily.     Marland Kitchen lidocaine (LIDODERM) 5 % Place 1 patch onto the skin daily as needed (back pain). Remove & Discard patch within 12 hours or as directed by MD    . Liraglutide  (VICTOZA) 18 MG/3ML SOPN Inject 1.8 mg into the skin daily.    . Melatonin 3 MG CAPS Take 1 capsule by mouth at bedtime as needed (sleep).     . Menthol, Topical Analgesic, (BIOFREEZE EX) Apply 1 application topically daily as needed (pain). Reported on 10/27/2015    . methylcellulose (ARTIFICIAL TEARS) 1 % ophthalmic solution Place 1 drop into both eyes 2 (two) times daily.    . metolazone (ZAROXOLYN) 2.5 MG tablet Take 2.5 mg by mouth daily.     . metoprolol succinate (TOPROL-XL) 100 MG 24 hr tablet Take 100 mg by mouth daily. Take with or immediately following a meal.    . pantoprazole (PROTONIX) 40 MG tablet Take 40 mg by mouth daily.    . potassium chloride SA (K-DUR,KLOR-CON) 20 MEQ tablet Take 20 mEq by mouth as directed. Take 2 tab (62mEq) every morning and 1 tab (20 mEq) every evening    . sennosides-docusate sodium (SENOKOT-S) 8.6-50 MG tablet Take 1 tablet by mouth daily as needed for constipation.    . Skin Protectants, Misc. (EUCERIN) cream Apply 1 application topically 2 (two) times daily.    Marland Kitchen torsemide (DEMADEX) 100 MG tablet Take 1 tablet (100 mg total) by mouth 2 (two) times daily. 60 tablet 6   No current facility-administered medications for this encounter.    BP 118/74 (BP Location: Right Arm, Patient Position: Sitting, Cuff Size: Large)   Pulse 83   Wt (!) 339 lb (153.8 kg)   SpO2 (!) 86%   BMI 45.98 kg/m    Wt Readings from Last 3 Encounters:  03/23/17 (!) 339 lb (153.8 kg)  02/09/17 (!) 333 lb 9.6 oz (151.3 kg)  01/05/17 (!) 329 lb (149.2 kg)    General: Elderly and markedly obese. No resp difficulty. HEENT: Normal except for blindness. Neck: Supple. JVP difficult to assess due to body habitus. Does not appear elevated. Carotids 2+ bilat; no bruits. No thyromegaly or nodule noted. Cor: PMI nondisplaced. RRR, 2/6 early SEM RUSB with clear S2. Lungs: Clear, but distant. Abdomen: Soft, non-tender, non-distended, no HSM. No bruits or masses. +BS  Extremities: No  cyanosis or clubbing. Unna boots in place. Trace to 1+ chronic edema.  Neuro: Alert & orientedx3, cranial nerves grossly intact. moves all 4 extremities w/o difficulty. Affect pleasant   Assessment/Plan: 1. Chronic diastolic CHF with prominent RV failure:  - Echo 10/2015 LVEF 60-65%, Mild/Mod AS, Mean gradient 13 mm Hg, Mild LAE, RV mildly dilated, Mild RAE, No TR jet. - NYHA class III-IIIB. Will repeat Echo to make sure EF has not dropped  with long standing HTN.  - Volume status looks OK on exam. Think dyspnea more related to URI.  - Continue torsemide 100 mg BID - He has been taking metolazone DAILY per [facility] records, at previous visit was taking twice weekly. BMET today. Concerned for his renal function. Will adjust accordingly after labs result.  - Reinforced fluid restriction to < 2 L daily, sodium restriction to less than 2000 mg daily, and the importance of daily weights.   2. Aortic stenosis: Mild-Mod on ECHO March 2017.    - Echo today pending.  3. Atrial fibrillation: Paroxysmal. - EKG 12/2016 with A pacing, V sensing and long AV delay + PVC - Last episode was in 10/14 based on device interrogation.  He is not anticoagulated (takes ASA) due to prior intracerebral hemorrhage.  - Regular on exam.  4. CAD: Nonobstructive. - Denies chest pain.  Continue ASA and statin.   5. Bradycardia s/p Medtronic PPM  - Stable. 6. CKD: Stage III - Says last visit was 4 months ago. Likely due follow up soon. BMET today.  7. OSA:  - Continue CPAP qhs.  Chronic leg edema seems stable from previous visits, he does not appear markedly volume overloaded centrally. Would attempt to limit metolazone use, and certainly not use daily. Will check labs today and adjust accordingly.   Shirley Friar, PA-C  03/23/2017

## 2017-03-23 NOTE — Progress Notes (Signed)
Advanced Heart Failure Medication Review by a Pharmacist  Does the patient  feel that his/her medications are working for him/her?  yes  Has the patient been experiencing any side effects to the medications prescribed?  no  Does the patient measure his/her own blood pressure or blood glucose at home?  yes   Does the patient have any problems obtaining medications due to transportation or finances?   no  Understanding of regimen: good Understanding of indications: good Potential of compliance: good Patient understands to avoid NSAIDs. Patient understands to avoid decongestants.  Issues to address at subsequent visits: None   Pharmacist comments: [name] is a pleasant 71 yo M presenting with an updated medication list from [facility]. His medications were updated based on the list and the only discrepancy noted was his metolazone 2.5 mg which he is now getting daily instead of every M/W/F.   [name]. [name], PharmD, BCPS, CPP Clinical Pharmacist Pager: [phone] Phone: [phone] 03/23/2017 10:11 AM      Time with patient: 12 minutes Preparation and documentation time: 2 minutes Total time: 14 minutes

## 2017-03-25 ENCOUNTER — Telehealth (HOSPITAL_COMMUNITY): Payer: Self-pay

## 2017-03-25 NOTE — Telephone Encounter (Signed)
ECHOCARDIOGRAM COMPLETE  Order: [id number]  Status:  Final result Visible to patient:  No (Not Released) Dx:  Chronic diastolic CHF (congestive hea...  Notes recorded by Effie Berkshire, RN on 03/25/2017 at 9:55 AM EDT Attempted to call, no answer, no VM to leave message ------  Notes recorded by Shirley Friar, PA-C on 03/24/2017 at 11:32 AM EDT EF normal. Please let patient know. Thanks.    Legrand Como [id number]" [place], PA-C 03/24/2017 11:32 AM

## 2017-03-29 NOTE — Telephone Encounter (Signed)
ECHOCARDIOGRAM COMPLETE  Order: [id number]  Status:  Final result Visible to patient:  No (Not Released) Dx:  Chronic diastolic CHF (congestive hea...  Notes recorded by Effie Berkshire, RN on 03/29/2017 at 11:03 AM EDT 2nd attempt made to reach patient. LVM with stable results and apt reminder in October. ------  Notes recorded by Effie Berkshire, RN on 03/25/2017 at 9:55 AM EDT Attempted to call, no answer, no VM to leave message ------  Notes recorded by Shirley Friar, PA-C on 03/24/2017 at 11:32 AM EDT EF normal. Please let patient know. Thanks.    Legrand Como [id number]" [place], PA-C 03/24/2017 11:32 AM

## 2017-04-06 ENCOUNTER — Encounter: Payer: Medicare (Managed Care) | Admitting: *Deleted

## 2017-04-06 ENCOUNTER — Telehealth: Payer: Self-pay | Admitting: Cardiology

## 2017-04-06 NOTE — Telephone Encounter (Signed)
Spoke with pt and reminded pt of remote transmission that is due today. Pt verbalized understanding.   

## 2017-04-07 ENCOUNTER — Encounter: Payer: Self-pay | Admitting: Cardiology

## 2017-04-12 ENCOUNTER — Other Ambulatory Visit (HOSPITAL_COMMUNITY): Payer: Self-pay | Admitting: Internal Medicine

## 2017-05-26 ENCOUNTER — Encounter (HOSPITAL_COMMUNITY): Payer: Medicare (Managed Care) | Admitting: Cardiology

## 2017-06-07 ENCOUNTER — Encounter (HOSPITAL_COMMUNITY): Payer: Medicare (Managed Care) | Admitting: Cardiology

## 2017-06-07 ENCOUNTER — Emergency Department (HOSPITAL_COMMUNITY)
Admission: EM | Admit: 2017-06-07 | Discharge: 2017-06-08 | Disposition: A | Discharge status: Home / Self Care | Payer: Medicare (Managed Care) | Attending: Emergency Medicine | Admitting: Emergency Medicine

## 2017-06-07 ENCOUNTER — Emergency Department (HOSPITAL_COMMUNITY): Payer: Medicare (Managed Care)

## 2017-06-07 ENCOUNTER — Encounter (HOSPITAL_COMMUNITY): Payer: Self-pay | Admitting: Emergency Medicine

## 2017-06-07 DIAGNOSIS — I129 Hypertensive chronic kidney disease with stage 1 through stage 4 chronic kidney disease, or unspecified chronic kidney disease: Secondary | ICD-10-CM | POA: Insufficient documentation

## 2017-06-07 DIAGNOSIS — I5032 Chronic diastolic (congestive) heart failure: Secondary | ICD-10-CM | POA: Insufficient documentation

## 2017-06-07 DIAGNOSIS — R110 Nausea: Secondary | ICD-10-CM | POA: Insufficient documentation

## 2017-06-07 DIAGNOSIS — Z87891 Personal history of nicotine dependence: Secondary | ICD-10-CM | POA: Insufficient documentation

## 2017-06-07 DIAGNOSIS — J449 Chronic obstructive pulmonary disease, unspecified: Secondary | ICD-10-CM | POA: Insufficient documentation

## 2017-06-07 DIAGNOSIS — Z8673 Personal history of transient ischemic attack (TIA), and cerebral infarction without residual deficits: Secondary | ICD-10-CM | POA: Insufficient documentation

## 2017-06-07 DIAGNOSIS — R42 Dizziness and giddiness: Secondary | ICD-10-CM

## 2017-06-07 DIAGNOSIS — N183 Chronic kidney disease, stage 3 (moderate): Secondary | ICD-10-CM | POA: Diagnosis not present

## 2017-06-07 DIAGNOSIS — E119 Type 2 diabetes mellitus without complications: Secondary | ICD-10-CM | POA: Insufficient documentation

## 2017-06-07 DIAGNOSIS — I2510 Atherosclerotic heart disease of native coronary artery without angina pectoris: Secondary | ICD-10-CM | POA: Insufficient documentation

## 2017-06-07 DIAGNOSIS — Z794 Long term (current) use of insulin: Secondary | ICD-10-CM | POA: Insufficient documentation

## 2017-06-07 DIAGNOSIS — I110 Hypertensive heart disease with heart failure: Secondary | ICD-10-CM | POA: Diagnosis not present

## 2017-06-07 DIAGNOSIS — H548 Legal blindness, as defined in USA: Secondary | ICD-10-CM | POA: Insufficient documentation

## 2017-06-07 DIAGNOSIS — Z950 Presence of cardiac pacemaker: Secondary | ICD-10-CM | POA: Diagnosis not present

## 2017-06-07 LAB — BASIC METABOLIC PANEL
ANION GAP: 12 (ref 5–15)
BUN: 62 mg/dL — ABNORMAL HIGH (ref 6–20)
CALCIUM: 9.2 mg/dL (ref 8.9–10.3)
CO2: 33 mmol/L — ABNORMAL HIGH (ref 22–32)
Chloride: 92 mmol/L — ABNORMAL LOW (ref 101–111)
Creatinine, Ser: 2.49 mg/dL — ABNORMAL HIGH (ref 0.61–1.24)
GFR, EST AFRICAN AMERICAN: 29 mL/min — AB (ref >60)
GFR, EST NON AFRICAN AMERICAN: 25 mL/min — AB (ref >60)
GLUCOSE: 149 mg/dL — AB (ref 65–99)
Potassium: 3.1 mmol/L — ABNORMAL LOW (ref 3.5–5.1)
Sodium: 137 mmol/L (ref 135–145)

## 2017-06-07 LAB — URINALYSIS, ROUTINE W REFLEX MICROSCOPIC
Bilirubin Urine: NEGATIVE
Glucose, UA: NEGATIVE mg/dL
Hgb urine dipstick: NEGATIVE
Ketones, ur: NEGATIVE mg/dL
Leukocytes, UA: NEGATIVE
NITRITE: NEGATIVE
PROTEIN: NEGATIVE mg/dL
SPECIFIC GRAVITY, URINE: 1.006 (ref 1.005–1.030)
pH: 7 (ref 5.0–8.0)

## 2017-06-07 LAB — CBC
HCT: 47.3 % (ref 39.0–52.0)
HEMOGLOBIN: 14.9 g/dL (ref 13.0–17.0)
MCH: 26.6 pg (ref 26.0–34.0)
MCHC: 31.5 g/dL (ref 30.0–36.0)
MCV: 84.3 fL (ref 78.0–100.0)
Platelets: 153 10*3/uL (ref 150–400)
RBC: 5.61 MIL/uL (ref 4.22–5.81)
RDW: 15.8 % — ABNORMAL HIGH (ref 11.5–15.5)
WBC: 12.2 10*3/uL — ABNORMAL HIGH (ref 4.0–10.5)

## 2017-06-07 LAB — CBG MONITORING, ED: GLUCOSE-CAPILLARY: 140 mg/dL — AB (ref 65–99)

## 2017-06-07 MED ORDER — ONDANSETRON HCL 4 MG/2ML IJ SOLN
INTRAMUSCULAR | Status: AC
  Administered 2017-06-07: 4 mg
  Filled 2017-06-07: qty 2

## 2017-06-07 MED ORDER — ONDANSETRON 4 MG PO TBDP: 4.0000 mg | ORAL_TABLET | Freq: Three times a day (TID) | ORAL | 1 refills | Status: DC | PRN

## 2017-06-07 MED ORDER — MECLIZINE HCL 25 MG PO TABS: 25.0000 mg | ORAL_TABLET | Freq: Two times a day (BID) | ORAL | 0 refills | Status: DC

## 2017-06-07 MED ORDER — MECLIZINE HCL 25 MG PO TABS
25.0000 mg | ORAL_TABLET | Freq: Once | ORAL | Status: AC
  Administered 2017-06-07: 25 mg via ORAL
  Filled 2017-06-07: qty 1

## 2017-06-07 NOTE — ED Provider Notes (Signed)
[facility]ERGENCY DEPARTMENT Provider Note   CSN: [id number] Arrival date & time: 06/07/17  2015     History   Chief Complaint Chief Complaint  Patient presents with  . Dizziness  . Nausea    HPI [name] is a 71 y.o. male.  Patient is blind.  Has been so for about a year and a half.  Patient states he had an acute onset of dizziness this morning.  Not sure about specific time.  Gets the dizziness and then he does get nauseated with vomiting.  Seems to be made worse with movement of his head.  Patient denies any room spinning but then again he has no visual input.  Denies headache and denies chest pain shortness of breath abdominal pain any diarrhea.  Any fever or chills.  Patient has a pacemaker right upper part of his chest.      Past Medical History:  Diagnosis Date  . Arthritis    "all over" (06/23/2013)  . Atrial fibrillation ([place])    not Candidate for Coumadin d/t rectal bleeding  . Benign prostatic hypertrophy   . Cardiomyopathy, hypertensive ([place])     diastolic dysfunction by 7371 echo  . Coronary artery disease     nonobstructive. 2 vessel. cardiac catheter in March 2005 showing 60% LAD occlusion, 30% circumflex occlusion.  . Degenerative joint disease     Right knee.  . Diabetes type 2, uncontrolled ([place])   . Diabetic peripheral neuropathy ([place])   . Diabetic ulcer of thigh ([place])    H/O Rt thigh ulcer.  . Exertional shortness of breath    "comes and goes" (06/23/2013)  . GERD (gastroesophageal reflux disease)   . Gout   . Herpes   . Hyperlipidemia   . Hypertension   . Hypotension 09/05/2013  . Incarcerated ventral hernia    H/O. S/P repair ventral hernia repair 07/2005.  Marland Kitchen LBBB (left bundle branch block)   . Legally blind   . Morbid obesity ([place])   . Obstructive sleep apnea    On CPAP. Last sleep study (06/2009) - Severe obstructive sleep apnea/hypopnea syndrome, apnea-hypopnea index 70.5 per hour with non positional  events moderately loud snoring and oxygen desaturation to a nadir of 85% on room air.  . Occult blood positive stool     History of in August 2007.  colonoscopy in January 2008 showing normal colon with fair inadequate prep. By Dr. Silvano Rusk.  . 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 Sepsis ([place]) 08/26/2013    Patient Active Problem List   Diagnosis Date Noted  . SSS (sick sinus syndrome) ([place]) 05/19/2016  . Cough 08/20/2015  . Chronic diastolic CHF (congestive heart failure) ([place]) 07/28/2015  . Morbid obesity ([place]) 07/09/2015  . [place] thrombus of heart 10/08/2013  . Cardiac mass 10/04/2013  . CVA (cerebrovascular accident due to intracerebral hemorrhage) ([place]) 10/02/2013  . CVA (cerebral infarction) 10/01/2013  . Intracerebral hemorrhage ([place]) 10/01/2013  . Hypertension 10/01/2013  . ICH (intracerebral hemorrhage) ([place]) 10/01/2013  . Right knee pain 09/07/2013  . CKD (chronic kidney disease) stage 3, GFR 30-59 ml/min ([facility]) 09/07/2013  . Orchitis 09/05/2013  . Unspecified protein-calorie malnutrition ([place]) 09/05/2013  . Sepsis ([place]) 08/27/2013  . Trichomonas infection 08/27/2013  . Epididymo-orchitis, acute 08/27/2013  . Atrial fibrillation ([place]) 06/24/2013  . OSA (obstructive sleep apnea) 05/04/2011  . Shortness of breath 03/17/2011  . Depression 10/23/2010  . Degenerative joint disease   . Cardiomyopathy, hypertensive ([place])   .  Coronary artery disease involving native coronary artery of native heart without angina pectoris   . Pacemaker   . Incarcerated ventral hernia   . COPD (chronic obstructive pulmonary disease) ([place]) 11/28/2009  . Type 2 diabetes mellitus, uncontrolled ([place]) 07/20/2006  . Hyperlipidemia 07/20/2006  . GOUT 07/20/2006  . MACULAR DEGENERATION 07/20/2006  . Essential hypertension 07/20/2006  . BENIGN PROSTATIC HYPERTROPHY, HX OF 07/20/2006    Past Surgical History:  Procedure Laterality Date  . AMPUTATION Left 06/22/2013   Procedure:  AMPUTATION FOOT;  Surgeon: Newt Minion, MD;  Location: [facility];  Service: Orthopedics;  Laterality: Left;  Left Midfoot Amputation  . CARDIAC CATHETERIZATION  10/22/2003   Dilatation of the LV with preserved LV systolic function. Tortous aorta, but no AAA. Moderate disease with 50-60% area of narrowing in the circumflex after the second OM and 70% mid narrowing in the LAD. No intervention.  Marland Kitchen CARDIOVASCULAR STRESS TEST  01/23/2010   Perfusion defect seen in inferior myocardial region is consistent with diaphragmatic attenuation. Remaining myocardium demonstrates normal myocardial perfusion with no evidence of ischemia or infarct. Stress ECG was non-diagnostic due to LBBB.  Marland Kitchen CATARACT EXTRACTION, BILATERAL Bilateral   . ESOPHAGOGASTRODUODENOSCOPY N/A 10/12/2013   Procedure: ESOPHAGOGASTRODUODENOSCOPY (EGD);  Surgeon: Gwenyth Ober, MD;  Location: [facility];  Service: General;  Laterality: N/A;  bedside  . EYE SURGERY    . 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 HERNIA REPAIR    . INSERT / REPLACE / REMOVE PACEMAKER  2003/11/13   Secondary to symptomatic bradycardia. DDDR pacemaker. By Dr. Rollene Fare.  Randolm Idol / REPLACE / REMOVE PACEMAKER  11-12-2012   "old pacemaker died; had new one put in" (06-29-13)  . Pacemaker lead revision  12/2003    likely microdislodgment  of right ventricular lead  . PEG PLACEMENT N/A 10/12/2013   Procedure: PERCUTANEOUS ENDOSCOPIC GASTROSTOMY (PEG) PLACEMENT;  Surgeon: Gwenyth Ober, MD;  Location: [facility];  Service: General;  Laterality: N/A;  . PERMANENT PACEMAKER GENERATOR CHANGE N/A 11/03/2012   Procedure: PERMANENT PACEMAKER GENERATOR CHANGE;  Surgeon: Sanda Klein, MD;  Location: [facility] CATH LAB;  Service: Cardiovascular;  Laterality: N/A;  . REFRACTIVE SURGERY Bilateral   . TRANSTHORACIC ECHOCARDIOGRAM  01/01/2013   EF 50-55%, ventricular septum-thickness moderately increased  . VENOUS DOPPLER  01/17/2013   No evidence of  thrombus or thrombophlebitis. Left GSVs appear enlarged and demonstrate valvular insuffiiciency. Bilateral SSVs appeared patent with no valvular insufficiency seen.  Marland Kitchen VENTRAL HERNIA REPAIR  07/2005    S/P laparotomy, lysis of adhesions, repair of incarcerated ventral hernia with  partial omentectomy.. Performed by Dr. Marlou Starks.       Home Medications    Prior to Admission medications   Medication Sig Start Date End Date Taking? Authorizing Provider  acetaminophen (TYLENOL) 650 MG CR tablet Take 1,300 mg by mouth 2 (two) times daily.     [provider]  albuterol (PROVENTIL HFA;VENTOLIN HFA) 108 (90 Base) MCG/ACT inhaler Inhale 2 puffs into the lungs every 6 (six) hours as needed for wheezing or shortness of breath.    [provider]  amiodarone (PACERONE) 200 MG tablet Take 200 mg by mouth daily. Reported on 12/09/2015    [provider]  amLODipine (NORVASC) 10 MG tablet Take 10 mg by mouth daily. Reported on 12/09/2015    [provider]  aspirin 325 MG tablet Take 325 mg by mouth daily.    [provider]  atorvastatin (LIPITOR) 40  MG tablet Take 1 tablet (40 mg total) by mouth daily. 04/16/15   Croitoru, Mihai, MD  Cholecalciferol (VITAMIN D-3) 1000 UNITS CAPS Take 1 capsule by mouth daily.    [provider]  citalopram (CELEXA) 20 MG tablet Take 20 mg by mouth daily.    [provider]  colchicine 0.6 MG tablet Take 0.3 mg by mouth daily.     [provider]  Febuxostat 80 MG TABS Take 80 mg by mouth daily.    [provider]  gabapentin (NEURONTIN) 300 MG capsule Take 300 mg by mouth 2 (two) times daily.    [provider]  hydrALAZINE (APRESOLINE) 25 MG tablet Take 25 mg by mouth 3 (three) times daily.    [provider]  Insulin Glargine (TOUJEO SOLOSTAR) 300 UNIT/ML SOPN Inject 85 Units into the skin 2 (two) times daily.     [provider]  lidocaine (LIDODERM) 5 % Place 1 patch  onto the skin daily as needed (back pain). Remove & Discard patch within 12 hours or as directed by MD    [provider]  Liraglutide (VICTOZA) 18 MG/3ML SOPN Inject 1.8 mg into the skin daily.    [provider]  meclizine (ANTIVERT) 25 MG tablet Take 1 tablet (25 mg total) by mouth 2 (two) times daily. 06/07/17   Fredia Sorrow, MD  Melatonin 3 MG CAPS Take 1 capsule by mouth at bedtime as needed (sleep).     [provider]  Menthol, Topical Analgesic, (BIOFREEZE EX) Apply 1 application topically daily as needed (pain). Reported on 10/27/2015    [provider]  methylcellulose (ARTIFICIAL TEARS) 1 % ophthalmic solution Place 1 drop into both eyes 2 (two) times daily.    [provider]  metolazone (ZAROXOLYN) 2.5 MG tablet Take 2.5 mg by mouth daily.     [provider]  metoprolol succinate (TOPROL-XL) 100 MG 24 hr tablet Take 100 mg by mouth daily. Take with or immediately following a meal.    [provider]  ondansetron (ZOFRAN ODT) 4 MG disintegrating tablet Take 1 tablet (4 mg total) by mouth every 8 (eight) hours as needed for nausea or vomiting. 06/07/17   Fredia Sorrow, MD  pantoprazole (PROTONIX) 40 MG tablet Take 40 mg by mouth daily.    [provider]  potassium chloride SA (K-DUR,KLOR-CON) 20 MEQ tablet Take 20 mEq by mouth as directed. Take 2 tab (71mEq) every morning and 1 tab (20 mEq) every evening    [provider]  sennosides-docusate sodium (SENOKOT-S) 8.6-50 MG tablet Take 1 tablet by mouth daily as needed for constipation.    [provider]  Skin Protectants, Misc. (EUCERIN) cream Apply 1 application topically 2 (two) times daily.    [provider]  torsemide (DEMADEX) 100 MG tablet Take 1 tablet (100 mg total) by mouth 2 (two) times daily. 07/08/15   Croitoru, Dani Gobble, MD    Family History Family History  Problem Relation Age of Onset  . Coronary artery disease Sister    . Heart disease Sister   . Allergies Sister   . Asthma Brother   . Rheum arthritis Brother     Social History Social History  Substance Use Topics  . Smoking status: Former Smoker    Packs/day: 0.00    Years: 0.00    Types: Cigarettes    Quit date: 08/17/2003  . Smokeless tobacco: Never Used     Comment: started at age 83.  only smoked  on occ- very rarely.0  . Alcohol use Yes     Comment: 06/23/2013 "drank some; never had a problem w/it"     Allergies   Patient has no known allergies.   Review of Systems Review of Systems  Constitutional: Negative for chills and fever.  HENT: Negative for congestion, hearing loss and tinnitus.   Eyes: Positive for visual disturbance.  Respiratory: Negative for shortness of breath.   Cardiovascular: Negative for chest pain.  Gastrointestinal: Positive for nausea and vomiting. Negative for abdominal pain and diarrhea.  Genitourinary: Negative for dysuria.  Musculoskeletal: Negative for back pain.  Skin: Negative for rash.  Neurological: Positive for dizziness. Negative for syncope, speech difficulty, weakness and headaches.  Hematological: Does not bruise/bleed easily.  Psychiatric/Behavioral: Negative for confusion.     Physical Exam Updated Vital Signs BP (!) 159/89   Pulse (!) 54   Temp 97.9 F (36.6 C)   Resp 14   Ht 1.829 m (6')   Wt (!) 154.2 kg (340 lb)   SpO2 92%   BMI 46.11 kg/m   Physical Exam  Constitutional: He is oriented to person, place, and time. He appears well-developed and well-nourished. No distress.  HENT:  Head: Normocephalic and atraumatic.  Right Ear: External ear normal.  Mouth/Throat: Oropharynx is clear and moist.  Eyes:  Patient blind.  Neck: Neck supple.  Cardiovascular: Normal rate and regular rhythm.   Pulmonary/Chest: Effort normal and breath sounds normal.  Abdominal: Soft. Bowel sounds are normal. There is no tenderness.  Musculoskeletal: Normal range of motion.  Neurological: He is  alert and oriented to person, place, and time. No cranial nerve deficit. He exhibits normal muscle tone. Coordination normal.  Except vision  Skin: Skin is warm.  Nursing note and vitals reviewed.    ED Treatments / Results  Labs (all labs ordered are listed, but only abnormal results are displayed) Labs Reviewed  BASIC METABOLIC PANEL - Abnormal; Notable for the following:       Result Value   Potassium 3.1 (*)    Chloride 92 (*)    CO2 33 (*)    Glucose, Bld 149 (*)    BUN 62 (*)    Creatinine, Ser 2.49 (*)    GFR calc non Af Amer 25 (*)    GFR calc Af Amer 29 (*)    All other components within normal limits  CBC - Abnormal; Notable for the following:    WBC 12.2 (*)    RDW 15.8 (*)    All other components within normal limits  URINALYSIS, ROUTINE W REFLEX MICROSCOPIC - Abnormal; Notable for the following:    Color, Urine STRAW (*)    All other components within normal limits  CBG MONITORING, ED - Abnormal; Notable for the following:    Glucose-Capillary 140 (*)    All other components within normal limits    EKG  EKG Interpretation None      Rate 67  Electronic atrial pacemaker  Radiology Ct Head Wo Contrast  Result Date: 06/07/2017 CLINICAL DATA:  Ataxia with dizziness and nausea EXAM: CT HEAD WITHOUT CONTRAST TECHNIQUE: Contiguous axial images were obtained from the base of the skull through the vertex without intravenous contrast. COMPARISON:  10/24/2013 FINDINGS: Brain: No acute territorial infarction, hemorrhage or intracranial is visualized. Mild encephalomalacia in the posterior left temporal lobe at the site of prior hemorrhage with minimal calcification in the region. Old appearing lacunar infarcts within the bilateral basal ganglia. Fairly extensive white matter hypodensity presumably  due to small vessel ischemic changes. Mild atrophy. Stable ventricle size. Vascular: No hyperdense vessels. An carotid artery vertebral artery calcification. Skull: No  fracture. Sinuses/Orbits: Mucosal thickening in the maxillary and ethmoid sinuses. Bilateral proptosis as before. Other: None IMPRESSION: 1. No definite CT evidence for acute intracranial abnormality. 2. Mild encephalomalacia in the posterior left temporal lobe with punctate calcification, correlating to the site of prior hemorrhage. 3. Fairly extensive hypodensity within the bilateral white matter, consistent with small vessel ischemic changes. Electronically Signed   By: Donavan Foil M.D.   On: 06/07/2017 22:50    Procedures Procedures (including critical care time)  Medications Ordered in ED Medications  ondansetron (ZOFRAN) 4 MG/2ML injection (4 mg  Given 06/07/17 2101)  meclizine (ANTIVERT) tablet 25 mg (25 mg Oral Given 06/07/17 2209)     Initial Impression / Assessment and Plan / ED Course  I have reviewed the triage vital signs and the nursing notes.  Pertinent labs & imaging results that were available during my care of the patient were reviewed by me and considered in my medical decision making (see chart for details).    Patient's symptoms may be consistent with vertigo.  Patient treated here with meclizine and Zofran with some improvements.  Dizziness is improved.  Patient still had one episode of vomiting since she has been here.  There is no diarrhea there is no belly pain there is no viral type symptoms or upper respiratory symptoms.  Do not feel that this is viral or an abdominal process.  Patient unfortunately has a pacemaker so not a candidate for MRI.  Head CT was done no evidence of any acute findings.  Unable to completely rule out stroke.  But if he does have one it would be small.  Certainly no significant focal deficit.  Patient unable to describe any room spinning but without visual input may not be able to have that feeling.  Definitely stated that things were brought on by moving his head. Will discharge home.  Today's labs are baseline for as far as renal  insufficiency goes.  Showing some evidence of elevated blood pressure.  Patient overall improved.  Close follow-up with his doctor will be important.  We will continue the meclizine and Zofran at home.   Final Clinical Impressions(s) / ED Diagnoses   Final diagnoses:  Vertigo    New Prescriptions New Prescriptions   MECLIZINE (ANTIVERT) 25 MG TABLET    Take 1 tablet (25 mg total) by mouth 2 (two) times daily.   ONDANSETRON (ZOFRAN ODT) 4 MG DISINTEGRATING TABLET    Take 1 tablet (4 mg total) by mouth every 8 (eight) hours as needed for nausea or vomiting.     Fredia Sorrow, MD 06/07/17 [phone]

## 2017-06-07 NOTE — ED Notes (Signed)
[facility] called to receive update on pt

## 2017-06-07 NOTE — ED Notes (Signed)
Pt placed on 3L [place], sats dropping while pt asleep

## 2017-06-07 NOTE — Discharge Instructions (Signed)
Take the Antivert as directed.  Take the Zofran as needed for nausea.  If symptoms do not improve over the next few days follow-up with your doctor.  Return here for any new or worse symptoms.

## 2017-06-07 NOTE — ED Notes (Signed)
BIB EMS from home, pt called out for dizziness onset 1100, dizziness got worse throughout the day and pt developed nausea. Dry heaving upon arrival to ED. Dizziness worsened when pt stood up from EMS to ED stretcher. VSS, CBG WNL

## 2017-06-07 NOTE — ED Notes (Signed)
[facility] called  

## 2017-06-07 NOTE — ED Notes (Signed)
Episode of emesis X1. Pt gown changed, pt cleaned up.

## 2017-06-13 ENCOUNTER — Encounter (HOSPITAL_COMMUNITY): Payer: Self-pay | Admitting: Emergency Medicine

## 2017-06-13 ENCOUNTER — Inpatient Hospital Stay (HOSPITAL_COMMUNITY): Payer: Medicare (Managed Care)

## 2017-06-13 ENCOUNTER — Emergency Department (HOSPITAL_COMMUNITY): Payer: Medicare (Managed Care)

## 2017-06-13 ENCOUNTER — Inpatient Hospital Stay (HOSPITAL_COMMUNITY)
Admission: EM | Admit: 2017-06-13 | Discharge: 2017-06-24 | DRG: 291 | Disposition: A | Discharge status: Skilled Nursing Facility | Payer: Medicare (Managed Care) | Attending: Internal Medicine | Admitting: Internal Medicine

## 2017-06-13 DIAGNOSIS — Z8249 Family history of ischemic heart disease and other diseases of the circulatory system: Secondary | ICD-10-CM

## 2017-06-13 DIAGNOSIS — J9690 Respiratory failure, unspecified, unspecified whether with hypoxia or hypercapnia: Secondary | ICD-10-CM

## 2017-06-13 DIAGNOSIS — R590 Localized enlarged lymph nodes: Secondary | ICD-10-CM | POA: Diagnosis present

## 2017-06-13 DIAGNOSIS — Z825 Family history of asthma and other chronic lower respiratory diseases: Secondary | ICD-10-CM

## 2017-06-13 DIAGNOSIS — J984 Other disorders of lung: Secondary | ICD-10-CM

## 2017-06-13 DIAGNOSIS — N183 Chronic kidney disease, stage 3 (moderate): Secondary | ICD-10-CM | POA: Diagnosis not present

## 2017-06-13 DIAGNOSIS — E1142 Type 2 diabetes mellitus with diabetic polyneuropathy: Secondary | ICD-10-CM | POA: Diagnosis present

## 2017-06-13 DIAGNOSIS — Z9981 Dependence on supplemental oxygen: Secondary | ICD-10-CM | POA: Diagnosis not present

## 2017-06-13 DIAGNOSIS — I350 Nonrheumatic aortic (valve) stenosis: Secondary | ICD-10-CM | POA: Diagnosis present

## 2017-06-13 DIAGNOSIS — L814 Other melanin hyperpigmentation: Secondary | ICD-10-CM | POA: Diagnosis not present

## 2017-06-13 DIAGNOSIS — Z452 Encounter for adjustment and management of vascular access device: Secondary | ICD-10-CM

## 2017-06-13 DIAGNOSIS — Z9841 Cataract extraction status, right eye: Secondary | ICD-10-CM

## 2017-06-13 DIAGNOSIS — L03116 Cellulitis of left lower limb: Secondary | ICD-10-CM | POA: Diagnosis not present

## 2017-06-13 DIAGNOSIS — G9340 Encephalopathy, unspecified: Secondary | ICD-10-CM | POA: Diagnosis not present

## 2017-06-13 DIAGNOSIS — T82838A Hemorrhage of vascular prosthetic devices, implants and grafts, initial encounter: Secondary | ICD-10-CM | POA: Diagnosis not present

## 2017-06-13 DIAGNOSIS — G4733 Obstructive sleep apnea (adult) (pediatric): Secondary | ICD-10-CM | POA: Diagnosis not present

## 2017-06-13 DIAGNOSIS — T45515A Adverse effect of anticoagulants, initial encounter: Secondary | ICD-10-CM | POA: Diagnosis not present

## 2017-06-13 DIAGNOSIS — M7989 Other specified soft tissue disorders: Secondary | ICD-10-CM | POA: Diagnosis not present

## 2017-06-13 DIAGNOSIS — R0603 Acute respiratory distress: Secondary | ICD-10-CM

## 2017-06-13 DIAGNOSIS — L0390 Cellulitis, unspecified: Secondary | ICD-10-CM

## 2017-06-13 DIAGNOSIS — I5033 Acute on chronic diastolic (congestive) heart failure: Secondary | ICD-10-CM | POA: Diagnosis not present

## 2017-06-13 DIAGNOSIS — J9601 Acute respiratory failure with hypoxia: Secondary | ICD-10-CM | POA: Diagnosis not present

## 2017-06-13 DIAGNOSIS — Z7982 Long term (current) use of aspirin: Secondary | ICD-10-CM

## 2017-06-13 DIAGNOSIS — E874 Mixed disorder of acid-base balance: Secondary | ICD-10-CM | POA: Diagnosis not present

## 2017-06-13 DIAGNOSIS — Z6841 Body Mass Index (BMI) 40.0 and over, adult: Secondary | ICD-10-CM

## 2017-06-13 DIAGNOSIS — Z978 Presence of other specified devices: Secondary | ICD-10-CM

## 2017-06-13 DIAGNOSIS — L280 Lichen simplex chronicus: Secondary | ICD-10-CM | POA: Diagnosis not present

## 2017-06-13 DIAGNOSIS — N179 Acute kidney failure, unspecified: Secondary | ICD-10-CM | POA: Diagnosis not present

## 2017-06-13 DIAGNOSIS — Z794 Long term (current) use of insulin: Secondary | ICD-10-CM | POA: Diagnosis not present

## 2017-06-13 DIAGNOSIS — Z87891 Personal history of nicotine dependence: Secondary | ICD-10-CM

## 2017-06-13 DIAGNOSIS — Z9289 Personal history of other medical treatment: Secondary | ICD-10-CM

## 2017-06-13 DIAGNOSIS — E1139 Type 2 diabetes mellitus with other diabetic ophthalmic complication: Secondary | ICD-10-CM | POA: Diagnosis present

## 2017-06-13 DIAGNOSIS — Y92230 Patient room in hospital as the place of occurrence of the external cause: Secondary | ICD-10-CM | POA: Diagnosis not present

## 2017-06-13 DIAGNOSIS — J810 Acute pulmonary edema: Secondary | ICD-10-CM

## 2017-06-13 DIAGNOSIS — I5032 Chronic diastolic (congestive) heart failure: Secondary | ICD-10-CM | POA: Diagnosis not present

## 2017-06-13 DIAGNOSIS — I480 Paroxysmal atrial fibrillation: Secondary | ICD-10-CM

## 2017-06-13 DIAGNOSIS — Z89432 Acquired absence of left foot: Secondary | ICD-10-CM

## 2017-06-13 DIAGNOSIS — Z8261 Family history of arthritis: Secondary | ICD-10-CM

## 2017-06-13 DIAGNOSIS — E876 Hypokalemia: Secondary | ICD-10-CM | POA: Diagnosis present

## 2017-06-13 DIAGNOSIS — I872 Venous insufficiency (chronic) (peripheral): Secondary | ICD-10-CM | POA: Diagnosis present

## 2017-06-13 DIAGNOSIS — I130 Hypertensive heart and chronic kidney disease with heart failure and stage 1 through stage 4 chronic kidney disease, or unspecified chronic kidney disease: Secondary | ICD-10-CM | POA: Diagnosis not present

## 2017-06-13 DIAGNOSIS — J811 Chronic pulmonary edema: Secondary | ICD-10-CM

## 2017-06-13 DIAGNOSIS — M1711 Unilateral primary osteoarthritis, right knee: Secondary | ICD-10-CM | POA: Diagnosis present

## 2017-06-13 DIAGNOSIS — T462X5A Adverse effect of other antidysrhythmic drugs, initial encounter: Secondary | ICD-10-CM | POA: Diagnosis not present

## 2017-06-13 DIAGNOSIS — H548 Legal blindness, as defined in USA: Secondary | ICD-10-CM | POA: Diagnosis present

## 2017-06-13 DIAGNOSIS — I2510 Atherosclerotic heart disease of native coronary artery without angina pectoris: Secondary | ICD-10-CM | POA: Diagnosis present

## 2017-06-13 DIAGNOSIS — E1165 Type 2 diabetes mellitus with hyperglycemia: Secondary | ICD-10-CM | POA: Diagnosis not present

## 2017-06-13 DIAGNOSIS — Z9842 Cataract extraction status, left eye: Secondary | ICD-10-CM

## 2017-06-13 DIAGNOSIS — T380X5A Adverse effect of glucocorticoids and synthetic analogues, initial encounter: Secondary | ICD-10-CM | POA: Diagnosis not present

## 2017-06-13 DIAGNOSIS — E785 Hyperlipidemia, unspecified: Secondary | ICD-10-CM | POA: Diagnosis present

## 2017-06-13 DIAGNOSIS — E118 Type 2 diabetes mellitus with unspecified complications: Secondary | ICD-10-CM | POA: Diagnosis not present

## 2017-06-13 DIAGNOSIS — I361 Nonrheumatic tricuspid (valve) insufficiency: Secondary | ICD-10-CM | POA: Diagnosis not present

## 2017-06-13 DIAGNOSIS — G9341 Metabolic encephalopathy: Secondary | ICD-10-CM | POA: Diagnosis present

## 2017-06-13 DIAGNOSIS — Z86711 Personal history of pulmonary embolism: Secondary | ICD-10-CM

## 2017-06-13 DIAGNOSIS — L03115 Cellulitis of right lower limb: Secondary | ICD-10-CM | POA: Diagnosis present

## 2017-06-13 DIAGNOSIS — E1122 Type 2 diabetes mellitus with diabetic chronic kidney disease: Secondary | ICD-10-CM | POA: Diagnosis present

## 2017-06-13 DIAGNOSIS — Z950 Presence of cardiac pacemaker: Secondary | ICD-10-CM

## 2017-06-13 DIAGNOSIS — J449 Chronic obstructive pulmonary disease, unspecified: Secondary | ICD-10-CM

## 2017-06-13 LAB — CBC WITH DIFFERENTIAL/PLATELET
BASOS ABS: 0 10*3/uL (ref 0.0–0.1)
BASOS PCT: 0 %
EOS ABS: 0 10*3/uL (ref 0.0–0.7)
EOS PCT: 0 %
HCT: 46.3 % (ref 39.0–52.0)
Hemoglobin: 14.3 g/dL (ref 13.0–17.0)
Lymphocytes Relative: 8 %
Lymphs Abs: 1.5 10*3/uL (ref 0.7–4.0)
MCH: 26.5 pg (ref 26.0–34.0)
MCHC: 30.9 g/dL (ref 30.0–36.0)
MCV: 85.7 fL (ref 78.0–100.0)
Monocytes Absolute: 1.7 10*3/uL — ABNORMAL HIGH (ref 0.1–1.0)
Monocytes Relative: 9 %
Neutro Abs: 14.9 10*3/uL — ABNORMAL HIGH (ref 1.7–7.7)
Neutrophils Relative %: 83 %
PLATELETS: 192 10*3/uL (ref 150–400)
RBC: 5.4 MIL/uL (ref 4.22–5.81)
RDW: 16.5 % — ABNORMAL HIGH (ref 11.5–15.5)
WBC: 18.2 10*3/uL — AB (ref 4.0–10.5)

## 2017-06-13 LAB — TROPONIN I: Troponin I: <0.03 ng/mL (ref <0.03)

## 2017-06-13 LAB — COMPREHENSIVE METABOLIC PANEL
ALK PHOS: 104 U/L (ref 38–126)
ALT: 97 U/L — AB (ref 17–63)
AST: 78 U/L — AB (ref 15–41)
Albumin: 3.2 g/dL — ABNORMAL LOW (ref 3.5–5.0)
Anion gap: 15 (ref 5–15)
BILIRUBIN TOTAL: 1.8 mg/dL — AB (ref 0.3–1.2)
BUN: 83 mg/dL — AB (ref 6–20)
CO2: 30 mmol/L (ref 22–32)
CREATININE: 3.3 mg/dL — AB (ref 0.61–1.24)
Calcium: 8.8 mg/dL — ABNORMAL LOW (ref 8.9–10.3)
Chloride: 90 mmol/L — ABNORMAL LOW (ref 101–111)
GFR calc Af Amer: 20 mL/min — ABNORMAL LOW (ref >60)
GFR, EST NON AFRICAN AMERICAN: 18 mL/min — AB (ref >60)
Glucose, Bld: 166 mg/dL — ABNORMAL HIGH (ref 65–99)
Potassium: 5.1 mmol/L (ref 3.5–5.1)
Sodium: 135 mmol/L (ref 135–145)
TOTAL PROTEIN: 8.1 g/dL (ref 6.5–8.1)

## 2017-06-13 LAB — MRSA PCR SCREENING: MRSA BY PCR: NEGATIVE

## 2017-06-13 LAB — GLUCOSE, CAPILLARY
GLUCOSE-CAPILLARY: 210 mg/dL — AB (ref 65–99)
Glucose-Capillary: 187 mg/dL — ABNORMAL HIGH (ref 65–99)

## 2017-06-13 LAB — RESPIRATORY PANEL BY PCR
ADENOVIRUS-RVPPCR: NOT DETECTED
Bordetella pertussis: NOT DETECTED
CORONAVIRUS NL63-RVPPCR: NOT DETECTED
CORONAVIRUS OC43-RVPPCR: NOT DETECTED
Chlamydophila pneumoniae: NOT DETECTED
Coronavirus 229E: NOT DETECTED
Coronavirus HKU1: NOT DETECTED
INFLUENZA A-RVPPCR: NOT DETECTED
Influenza B: NOT DETECTED
METAPNEUMOVIRUS-RVPPCR: NOT DETECTED
MYCOPLASMA PNEUMONIAE-RVPPCR: NOT DETECTED
PARAINFLUENZA VIRUS 1-RVPPCR: NOT DETECTED
PARAINFLUENZA VIRUS 2-RVPPCR: NOT DETECTED
PARAINFLUENZA VIRUS 3-RVPPCR: NOT DETECTED
PARAINFLUENZA VIRUS 4-RVPPCR: NOT DETECTED
Respiratory Syncytial Virus: NOT DETECTED
Rhinovirus / Enterovirus: NOT DETECTED

## 2017-06-13 LAB — POCT I-STAT 3, ART BLOOD GAS (G3+)
ACID-BASE EXCESS: 9 mmol/L — AB (ref 0.0–2.0)
Bicarbonate: 34.7 mmol/L — ABNORMAL HIGH (ref 20.0–28.0)
O2 SAT: 93 %
PH ART: 7.468 — AB (ref 7.350–7.450)
TCO2: 36 mmol/L — AB (ref 22–32)
pCO2 arterial: 47.8 mmHg (ref 32.0–48.0)
pO2, Arterial: 63 mmHg — ABNORMAL LOW (ref 83.0–108.0)

## 2017-06-13 LAB — TRIGLYCERIDES: TRIGLYCERIDES: 78 mg/dL (ref <150)

## 2017-06-13 LAB — BRAIN NATRIURETIC PEPTIDE: B NATRIURETIC PEPTIDE 5: 158.5 pg/mL — AB (ref 0.0–100.0)

## 2017-06-13 MED ORDER — CHLORHEXIDINE GLUCONATE 0.12% ORAL RINSE (MEDLINE KIT)
15.0000 mL | Freq: Two times a day (BID) | OROMUCOSAL | Status: DC
Start: 2017-06-13 — End: 2020-01-29

## 2017-06-13 MED ORDER — MIDAZOLAM HCL 5 MG/5ML IJ SOLN
INTRAMUSCULAR | Status: AC | PRN
  Administered 2017-06-13: 2 mg via INTRAVENOUS

## 2017-06-13 MED ORDER — FENTANYL CITRATE (PF) 100 MCG/2ML IJ SOLN: 50.0000 ug | INTRAMUSCULAR | Status: DC | PRN

## 2017-06-13 MED ORDER — FENTANYL CITRATE (PF) 100 MCG/2ML IJ SOLN
INTRAMUSCULAR | Status: AC
  Filled 2017-06-13: qty 2

## 2017-06-13 MED ORDER — ROCURONIUM BROMIDE 50 MG/5ML IV SOLN: 100.0000 mg | Freq: Once | INTRAVENOUS | Status: DC

## 2017-06-13 MED ORDER — MIDAZOLAM HCL 2 MG/2ML IJ SOLN: 1.0000 mg | INTRAMUSCULAR | Status: DC | PRN

## 2017-06-13 MED ORDER — AMIODARONE HCL 200 MG PO TABS: 200.0000 mg | ORAL_TABLET | Freq: Every day | ORAL | Status: DC

## 2017-06-13 MED ORDER — HYDRALAZINE HCL 25 MG PO TABS: 25.0000 mg | ORAL_TABLET | Freq: Three times a day (TID) | ORAL | Status: DC

## 2017-06-13 MED ORDER — ASPIRIN 325 MG PO TABS
325.0000 mg | ORAL_TABLET | Freq: Every day | ORAL | Status: DC
  Administered 2017-06-14 – 2017-06-15 (×2): 325 mg
  Filled 2017-06-13 (×2): qty 1

## 2017-06-13 MED ORDER — ONDANSETRON HCL 4 MG/2ML IJ SOLN
4.0000 mg | Freq: Four times a day (QID) | INTRAMUSCULAR | Status: DC | PRN
  Administered 2017-06-16: 4 mg via INTRAVENOUS
  Filled 2017-06-13: qty 2

## 2017-06-13 MED ORDER — INSULIN ASPART 100 UNIT/ML ~~LOC~~ SOLN
0.0000 [IU] | SUBCUTANEOUS | Status: DC
  Administered 2017-06-13 – 2017-06-14 (×2): 7 [IU] via SUBCUTANEOUS
  Administered 2017-06-14: 4 [IU] via SUBCUTANEOUS
  Administered 2017-06-14 (×3): 7 [IU] via SUBCUTANEOUS
  Administered 2017-06-14: 4 [IU] via SUBCUTANEOUS
  Administered 2017-06-14: 3 [IU] via SUBCUTANEOUS
  Administered 2017-06-15: 4 [IU] via SUBCUTANEOUS
  Administered 2017-06-15: 7 [IU] via SUBCUTANEOUS
  Administered 2017-06-15 (×3): 4 [IU] via SUBCUTANEOUS
  Administered 2017-06-16: 3 [IU] via SUBCUTANEOUS
  Administered 2017-06-16: 4 [IU] via SUBCUTANEOUS
  Administered 2017-06-16: 3 [IU] via SUBCUTANEOUS
  Administered 2017-06-16: 7 [IU] via SUBCUTANEOUS
  Administered 2017-06-16: 11 [IU] via SUBCUTANEOUS
  Administered 2017-06-16: 4 [IU] via SUBCUTANEOUS
  Administered 2017-06-17 (×2): 3 [IU] via SUBCUTANEOUS
  Administered 2017-06-17 (×3): 4 [IU] via SUBCUTANEOUS
  Administered 2017-06-17 – 2017-06-18 (×2): 3 [IU] via SUBCUTANEOUS
  Administered 2017-06-18: 7 [IU] via SUBCUTANEOUS
  Administered 2017-06-18: 4 [IU] via SUBCUTANEOUS
  Administered 2017-06-18: 3 [IU] via SUBCUTANEOUS

## 2017-06-13 MED ORDER — MIDAZOLAM HCL 2 MG/2ML IJ SOLN
INTRAMUSCULAR | Status: AC
  Filled 2017-06-13: qty 2

## 2017-06-13 MED ORDER — ASPIRIN 325 MG PO TABS: 325.0000 mg | ORAL_TABLET | Freq: Every day | ORAL | Status: DC

## 2017-06-13 MED ORDER — AZITHROMYCIN 500 MG IV SOLR
500.0000 mg | Freq: Once | INTRAVENOUS | Status: AC
  Administered 2017-06-13: 500 mg via INTRAVENOUS
  Filled 2017-06-13: qty 500

## 2017-06-13 MED ORDER — SODIUM CHLORIDE 0.9 % IV SOLN: 250.0000 mL | INTRAVENOUS | Status: DC | PRN

## 2017-06-13 MED ORDER — FUROSEMIDE 10 MG/ML IJ SOLN
60.0000 mg | Freq: Once | INTRAMUSCULAR | Status: AC
  Administered 2017-06-13: 60 mg via INTRAVENOUS
  Filled 2017-06-13: qty 6

## 2017-06-13 MED ORDER — CITALOPRAM HYDROBROMIDE 20 MG PO TABS
20.0000 mg | ORAL_TABLET | Freq: Every day | ORAL | Status: DC
  Administered 2017-06-14: 20 mg
  Filled 2017-06-13 (×2): qty 1

## 2017-06-13 MED ORDER — SODIUM CHLORIDE 0.9 % IV SOLN
INTRAVENOUS | Status: DC
Start: 2017-06-13 — End: 2017-06-25
  Administered 2017-06-13: 16:00:00 via INTRAVENOUS
  Administered 2017-06-15: 10 mL/h via INTRAVENOUS
  Administered 2017-06-16: 05:00:00 via INTRAVENOUS

## 2017-06-13 MED ORDER — ORAL CARE MOUTH RINSE: 15.0000 mL | Freq: Four times a day (QID) | OROMUCOSAL | Status: DC

## 2017-06-13 MED ORDER — ETOMIDATE 2 MG/ML IV SOLN
INTRAVENOUS | Status: AC | PRN
  Administered 2017-06-13 (×2): 20 mg via INTRAVENOUS

## 2017-06-13 MED ORDER — CHLORHEXIDINE GLUCONATE 0.12% ORAL RINSE (MEDLINE KIT)
15.0000 mL | Freq: Two times a day (BID) | OROMUCOSAL | Status: DC
  Administered 2017-06-13 – 2017-06-16 (×6): 15 mL via OROMUCOSAL

## 2017-06-13 MED ORDER — METOPROLOL TARTRATE 25 MG PO TABS: 50.0000 mg | ORAL_TABLET | Freq: Two times a day (BID) | ORAL | Status: DC

## 2017-06-13 MED ORDER — AMLODIPINE BESYLATE 10 MG PO TABS
10.0000 mg | ORAL_TABLET | Freq: Every day | ORAL | Status: DC
  Administered 2017-06-14 – 2017-06-24 (×9): 10 mg via ORAL
  Filled 2017-06-13 (×11): qty 1

## 2017-06-13 MED ORDER — HYDRALAZINE HCL 50 MG PO TABS
25.0000 mg | ORAL_TABLET | Freq: Three times a day (TID) | ORAL | Status: DC
  Administered 2017-06-13 – 2017-06-18 (×15): 25 mg
  Filled 2017-06-13 (×17): qty 1

## 2017-06-13 MED ORDER — ACETAMINOPHEN 325 MG PO TABS
650.0000 mg | ORAL_TABLET | Freq: Four times a day (QID) | ORAL | Status: DC | PRN
  Administered 2017-06-16: 650 mg
  Filled 2017-06-13: qty 2

## 2017-06-13 MED ORDER — ORAL CARE MOUTH RINSE
15.0000 mL | Freq: Four times a day (QID) | OROMUCOSAL | Status: DC
  Administered 2017-06-13 – 2017-06-16 (×10): 15 mL via OROMUCOSAL

## 2017-06-13 MED ORDER — METHYLPREDNISOLONE SODIUM SUCC 125 MG IJ SOLR
125.0000 mg | Freq: Once | INTRAMUSCULAR | Status: AC
  Administered 2017-06-13: 125 mg via INTRAVENOUS
  Filled 2017-06-13: qty 2

## 2017-06-13 MED ORDER — ATORVASTATIN CALCIUM 40 MG PO TABS
40.0000 mg | ORAL_TABLET | Freq: Every day | ORAL | Status: DC
  Administered 2017-06-14 – 2017-06-23 (×8): 40 mg
  Filled 2017-06-13 (×9): qty 1

## 2017-06-13 MED ORDER — INSULIN GLARGINE 100 UNIT/ML ~~LOC~~ SOLN
40.0000 [IU] | Freq: Every day | SUBCUTANEOUS | Status: DC
  Administered 2017-06-14: 40 [IU] via SUBCUTANEOUS
  Filled 2017-06-13 (×2): qty 0.4

## 2017-06-13 MED ORDER — FAMOTIDINE 20 MG PO TABS
20.0000 mg | ORAL_TABLET | Freq: Two times a day (BID) | ORAL | Status: DC
  Administered 2017-06-13: 20 mg
  Filled 2017-06-13 (×2): qty 1

## 2017-06-13 MED ORDER — VITAMIN D 1000 UNITS PO TABS
1000.0000 [IU] | ORAL_TABLET | Freq: Every day | ORAL | Status: DC
  Administered 2017-06-14 – 2017-06-24 (×9): 1000 [IU] via ORAL
  Filled 2017-06-13 (×12): qty 1

## 2017-06-13 MED ORDER — HEPARIN SODIUM (PORCINE) 5000 UNIT/ML IJ SOLN
5000.0000 [IU] | Freq: Three times a day (TID) | INTRAMUSCULAR | Status: DC
  Administered 2017-06-13 – 2017-06-24 (×29): 5000 [IU] via SUBCUTANEOUS
  Filled 2017-06-13 (×33): qty 1

## 2017-06-13 MED ORDER — CITALOPRAM HYDROBROMIDE 10 MG PO TABS: 20.0000 mg | ORAL_TABLET | Freq: Every day | ORAL | Status: DC

## 2017-06-13 MED ORDER — ACETAMINOPHEN 325 MG PO TABS: 650.0000 mg | ORAL_TABLET | Freq: Four times a day (QID) | ORAL | Status: DC | PRN

## 2017-06-13 MED ORDER — SENNOSIDES-DOCUSATE SODIUM 8.6-50 MG PO TABS
1.0000 | ORAL_TABLET | Freq: Every evening | ORAL | Status: DC | PRN
  Filled 2017-06-13: qty 1

## 2017-06-13 MED ORDER — ATORVASTATIN CALCIUM 40 MG PO TABS: 40.0000 mg | ORAL_TABLET | Freq: Every day | ORAL | Status: DC

## 2017-06-13 MED ORDER — SENNA-DOCUSATE SODIUM 8.6-50 MG PO TABS: 1.0000 | ORAL_TABLET | Freq: Every evening | ORAL | Status: DC | PRN

## 2017-06-13 MED ORDER — AMIODARONE HCL 200 MG PO TABS
200.0000 mg | ORAL_TABLET | Freq: Every day | ORAL | Status: DC
  Administered 2017-06-14 – 2017-06-17 (×3): 200 mg
  Filled 2017-06-13 (×4): qty 1

## 2017-06-13 MED ORDER — FENTANYL CITRATE (PF) 100 MCG/2ML IJ SOLN
INTRAMUSCULAR | Status: AC | PRN
  Administered 2017-06-13: 100 ug via INTRAVENOUS

## 2017-06-13 MED ORDER — PROPOFOL 1000 MG/100ML IV EMUL
0.0000 ug/kg/min | INTRAVENOUS | Status: DC
  Administered 2017-06-13 (×2): 50 ug/kg/min via INTRAVENOUS
  Administered 2017-06-13: 5 ug/kg/min via INTRAVENOUS
  Administered 2017-06-14: 46 ug/kg/min via INTRAVENOUS
  Administered 2017-06-14: 42 ug/kg/min via INTRAVENOUS
  Administered 2017-06-14: 50 ug/kg/min via INTRAVENOUS
  Filled 2017-06-13 (×9): qty 100

## 2017-06-13 NOTE — ED Triage Notes (Signed)
Pt arrives on [facility]AP with [facility] pt was picked up from home for shortness of breath that started suddenly. Pt was oin cpap on ems arrival. Pt is alert and oriented to person place and time.

## 2017-06-13 NOTE — H&P (Signed)
PULMONARY / CRITICAL CARE MEDICINE   Name: Bill Yohn MRN: [id number] DOB: 1945-08-31    ADMISSION DATE:  06/13/2017 CONSULTATION DATE:  06/13/17  REFERRING MD:  Dr. Vanita Panda / EDP   CHIEF COMPLAINT:  Shortness of Breath   HISTORY OF PRESENT ILLNESS:  71 y/o M who presented to [facility] on 10/29 with reports of acute shortness of breath.    The patient is on BiPAP.  Information obtained from patient (as able), chart review and family at bedside.  The patient lives at home with the assistance of home health aides.  They are in/out of his home frequently during the day.  They assist him with all aspects of care.  In addition, [facility] 2-[facility] per [facility].  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 Family reports that some of the aides are good about keeping him to a low sodium diet, others are not.  They are unsure if he actually follows his fluid restriction.  Daughter thinks he was recently taken off a "blood thinner" but she is unsure (none listed on home medication list).  He does carry a hx of PE and prior anticoagulation. Family (sister, daughter and brother in law deny knowledge of him ever smoking). They state he has been on the vent before but are unsure if he would want it again.   He was recently seen in the ER on 10/23 for dizziness and vomiting that resolved with meclizine and zofran. The patient denies known sick contacts. EMS was called for acute shortness of breath.  He was placed on CPAP and transported to the ER.  In the ER, he was found to be hypoxic requiring BiPAP 16/8 with 100% FiO2 (he dropped to the 80's on 80% O2 per RT).  Initial CXR shows concern for pulmonary edema with pleural effusion.  Initial labs- Na 135, K 5.1, CL 90, CO2 30, BUN 83, CR 3.30 (up from 2.49 on 10/23, baseline 2.1-2.6 in the last year), glucose 166, AST 78 / ALT 97, T.Bili 1.8, Troponin <0.03, WBC 18.2, Hgb 14.3 and platelets 192.  He is unable to describe further symptoms  due to BiPAP mask.   He is followed by the CHF clinic and was most recently seen on 03/23/17 by Oda Kilts, PA-C.  At that time his office weight was 339 lbs.  In 12/2016 he was 329lbs.  On 10/23 in the ER he was 340 lbs.    PAST MEDICAL HISTORY :  He  has a past medical history of Arthritis; Atrial fibrillation ([place]); Benign prostatic hypertrophy; Cardiomyopathy, hypertensive ([place]); Coronary artery disease; Degenerative joint disease; Diabetes type 2, uncontrolled ([place]); Diabetic peripheral neuropathy ([place]); Diabetic ulcer of thigh ([place]); Exertional shortness of breath; GERD (gastroesophageal reflux disease); Gout; Herpes; Hyperlipidemia; Hypertension; Hypotension (09/05/2013); Incarcerated ventral hernia; LBBB (left bundle branch block); Legally blind; Morbid obesity ([place]); Obstructive sleep apnea; Occult blood positive stool; Pacemaker (2005); and Sepsis ([place]) (08/26/2013).  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  No Known Allergies  No  current facility-administered medications on file prior to encounter.    Current Outpatient Prescriptions on File Prior to Encounter  Medication Sig  . acetaminophen (TYLENOL) 650 MG CR tablet Take 1,300 mg by mouth 2 (two) times daily.   Marland Kitchen albuterol (PROVENTIL HFA;VENTOLIN HFA) 108 (90 Base) MCG/ACT inhaler Inhale 2  puffs into the lungs every 6 (six) hours as needed for wheezing or shortness of breath.  Marland Kitchen amiodarone (PACERONE) 200 MG tablet Take 200 mg by mouth daily. Reported on 12/09/2015  . amLODipine (NORVASC) 10 MG tablet Take 10 mg by mouth daily. Reported on 12/09/2015  . aspirin 325 MG tablet Take 325 mg by mouth daily.  Marland Kitchen atorvastatin (LIPITOR) 40 MG tablet Take 1 tablet (40 mg total) by mouth daily.  . Cholecalciferol (VITAMIN D-3) 1000 UNITS CAPS Take 1 capsule by mouth daily.  . citalopram (CELEXA) 20 MG tablet Take 20 mg by mouth daily.  . Febuxostat 80 MG TABS Take 80 mg by mouth daily.  Marland Kitchen gabapentin (NEURONTIN) 300 MG capsule Take 300 mg by mouth 2 (two) times daily.  . hydrALAZINE (APRESOLINE) 25 MG tablet Take 25 mg by mouth 3 (three) times daily.  . Insulin Glargine (TOUJEO SOLOSTAR) 300 UNIT/ML SOPN Inject 85 Units into the skin 2 (two) times daily.   Marland Kitchen lidocaine (LIDODERM) 5 % Place 1 patch onto the skin daily as needed (back pain). Remove & Discard patch within 12 hours or as directed by MD  . Liraglutide (VICTOZA) 18 MG/3ML SOPN Inject 1.8 mg into the skin daily.  . meclizine (ANTIVERT) 25 MG tablet Take 1 tablet (25 mg total) by mouth 2 (two) times daily.  . Melatonin 3 MG CAPS Take 1 capsule by mouth at bedtime as needed (sleep).   . Menthol, Topical Analgesic, (BIOFREEZE EX) Apply 1 application topically 3 (three) times daily as needed (pain). Reported on 10/27/2015  . methylcellulose (ARTIFICIAL TEARS) 1 % ophthalmic solution Place 1 drop into both eyes 2 (two) times daily as needed (dry eyes).   . metolazone (ZAROXOLYN) 2.5 MG tablet Take 2.5 mg by mouth daily.   . metoprolol succinate (TOPROL-XL) 100 MG 24 hr tablet Take 100 mg by mouth daily. Take with or immediately following a meal.  . ondansetron (ZOFRAN ODT) 4 MG disintegrating tablet Take 1 tablet (4 mg total) by mouth every 8 (eight) hours as needed for nausea or vomiting.  . potassium chloride SA (K-DUR,KLOR-CON) 20 MEQ  tablet Take 20-30 mEq by mouth See admin instructions. Take 2 tab (74mEq) every morning and 1.5 tab (30 mEq) every evening  . sennosides-docusate sodium (SENOKOT-S) 8.6-50 MG tablet Take 1 tablet by mouth at bedtime as needed and may repeat dose one time if needed for constipation.   . Skin Protectants, Misc. (EUCERIN) cream Apply 1 application topically 2 (two) times daily.  Marland Kitchen torsemide (DEMADEX) 100 MG tablet Take 1 tablet (100 mg total) by mouth 2 (two) times daily.    FAMILY HISTORY:  His    SOCIAL HISTORY: He  reports that he quit smoking about 13 years ago. His smoking use included Cigarettes. He smoked 0.00 packs per day for 0.00 years. He has never used smokeless tobacco. He reports that he drinks alcohol. He reports that he does not use drugs.  REVIEW OF SYSTEMS:  Patient is unable to complete due to SOB / BiPAP mask   SUBJECTIVE: As above   VITAL SIGNS: BP (!) 157/73   Pulse 67   Temp 98.7 F (37.1 C) (Temporal)  Resp 19   SpO2 95%   HEMODYNAMICS:    VENTILATOR SETTINGS: Vent Mode: BIPAP;PCV FiO2 (%):  [80 %-100 %] 80 % Set Rate:  [12 bmp] 12 bmp PEEP:  [6 cmH20-8 cmH20] 8 cmH20  INTAKE / OUTPUT: No intake/output data recorded.  PHYSICAL EXAMINATION: General: chronically ill appearing male on BiPAP HEENT: MM pink/dry, BiPAP mask in place  PSY: calm Neuro: drowsy, awakens to voice, drifts back to sleep, speech clear when awake CV: s1s2 rrr, no m/r/g, diminished tones  PULM: even/non-labored, lungs bilaterally clear anterior, diminished lower lateral  TF:TDDU, non-tender, bsx4 active  Extremities: warm/dry, 1+ pitting BLE edema, compression stockings in place, LLE transmetatarsal amputation well healed Skin: no rashes or lesions  LABS:  BMET  Recent Labs Lab 06/07/17 2024 06/13/17 0806  NA 137 135  K 3.1* 5.1  CL 92* 90*  CO2 33* 30  BUN 62* 83*  CREATININE 2.49* 3.30*  GLUCOSE 149* 166*    Electrolytes  Recent Labs Lab 06/07/17 2024  06/13/17 0806  CALCIUM 9.2 8.8*    CBC  Recent Labs Lab 06/07/17 2024 06/13/17 0806  WBC 12.2* 18.2*  HGB 14.9 14.3  HCT 47.3 46.3  PLT 153 192    Coag's No results for input(s): APTT, INR in the last 168 hours.  Sepsis Markers No results for input(s): LATICACIDVEN, PROCALCITON, O2SATVEN in the last 168 hours.  ABG No results for input(s): PHART, PCO2ART, PO2ART in the last 168 hours.  Liver Enzymes  Recent Labs Lab 06/13/17 0806  AST 78*  ALT 97*  ALKPHOS 104  BILITOT 1.8*  ALBUMIN 3.2*    Cardiac Enzymes  Recent Labs Lab 06/13/17 0806  TROPONINI <0.03    Glucose  Recent Labs Lab 06/07/17 2023  GLUCAP 140*    Imaging Dg Chest Port 1 View  Result Date: 06/13/2017 CLINICAL DATA:  Shortness of breath. EXAM: PORTABLE CHEST 1 VIEW COMPARISON:  February 09, 2017 FINDINGS: There is diffuse interstitial pulmonary edema. There is mild alveolar edema in the right base. There is a right pleural effusion. There is cardiomegaly with pulmonary venous hypertension. There is aortic atherosclerosis. Pacemaker leads are attached to the right atrium and right ventricle. IMPRESSION: Right pleural effusion with diffuse interstitial and patchy alveolar edema. Cardiomegaly with pulmonary venous hypertension. By radiography, these are findings felt to be indicative of a degree of congestive heart failure. Pacemaker leads attached to right atrium and right ventricle. There is aortic atherosclerosis. Aortic Atherosclerosis (ICD10-I70.0). Electronically Signed   By: Lowella Grip III M.D.   On: 06/13/2017 08:35     STUDIES:  ECHO 10/29 >>  BLE Venous Duplex 10/29 >>   CULTURES: RVP 10/29 >>   ANTIBIOTICS:   SIGNIFICANT EVENTS: 10/29  Admit with acute SOB  LINES/TUBES:   DISCUSSION: 71 y/o M with a complex medical history admitted 10/29 with acute onset SOB.  Suspect decompensated diastolic CHF but viral illness, PE also on differential (given high O2 needs).     ASSESSMENT / PLAN:  PULMONARY A: Acute Hypoxic Respiratory Failure - in setting of AKI, decompensated CHF, r/o PNA, viral illness, PE OSA - baseline CPAP 12cm H2O Hx PE in 2015 - not on anticoagulation due to rectal bleeding P:   Now intubation for airway protection / hypoxic failure PRVC 8cc/kg  Wean PEEP / FiO2 for sats > 92% Received empiric abx in ER for possible CAP, will hold further for now See ID  Trend CXR Will need CPAP QHS post extubation  Defer  diuretic regimen to Cardiology  Assess LE dopplers to r/o PE ABG/PCXR post intubation   CARDIOVASCULAR A:  Diastolic CHF with concern for Acute Decompensation Hx HTN, HLD, CAD, PAF (not on anticoagulation due to rectal bleeding), bradycardia s/p pacemaker  P:  ICU admit Continue home norvasc, amiodarone, ASA, lipitor, hydralazine, lopressor CHF Service consulted, appreciate input  Will defer diuretics to CHF team   Trend troponin   RENAL A:   AKI on CKD - baseline sr cr 2.1-2.6  P:   Trend BMP / urinary output Replace electrolytes as indicated Avoid nephrotoxic agents, ensure adequate renal perfusion   GASTROINTESTINAL A:   Morbid Obesity  Mild Elevation of LFT's - 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  P:   NPO  Insert OGT Consider TF in am 10/30 if remains intubated   HEMATOLOGIC A:   Hx of PE P:  Trend CBC  Heparin for DVT prophylaxis   INFECTIOUS A:   Rule Out Viral Illness, PNA P:   Assess RVP  Hold further abx for now   ENDOCRINE A:   DM - with CKD, resultant blindness    P:   Hold home oral agents SSI Q4 with resistant scale   NEUROLOGIC A:   Acute Metabolic Encephalopathy  P:   RASS goal: 0 to -1  PRN fentanyl / versed for pain & sedation  Minimize sedating medications as able  Daily SBT / WUA    FAMILY  - Updates: Daughter, sister and brother-in-law updated at bedside.    - Inter-disciplinary family meet or Palliative Care meeting due by:  11/5   Noe Gens, NP-C [place] Pgr: [phone] or if no answer [phone] 06/13/2017, 12:45 PM

## 2017-06-13 NOTE — Progress Notes (Signed)
RT increased Bipap setting to 16/8 total pressure due to low ssays down to 84. RT attempted to wean FIO2 to 80% prior to this and returned settings back to 100% within 5 minutes. Pt tol well. Sats resolved. Will cont to monitor

## 2017-06-13 NOTE — Progress Notes (Signed)
Changed per Dr. Halford Chessman.

## 2017-06-13 NOTE — Consult Note (Signed)
Advanced Heart Failure Team Consult Note   Primary Physician: Dr Amie Critchley [facility] Primary Cardiologist:  Dr Aundra Dubin Nephrology: Dr Joelyn Oms   Reason for Consultation: Heart Failure   HPI:    [name] is seen today for evaluation of heart failure at the request of Dr Halford Chessman.   [name] is a 71 year old with history of chronic diastolic heart failure complicated by RV failure, CKD Stage III, symptomatic bradycardia, S/P PPM, ICH 2015, PE 2015, OSA, CAD nonobstructive LHC 2006, morbid obesity, and blindness.   He presented via EMS with acute shortness of breather. Placed on CPAP during transport. O2 sats in the 80s so placed on Bipap 100% FiO2. He was intubated in the ED and thought to be dry per CCM. CXR completed with pulmonary edema. Pertinent admission labs include: K 5.1 , creatinine 3.3, troponin < 0.03, BNP 158, WBC 18, and Hgb 14.3  Echo 03/2017 EF 60-65%. Grade II DD RV mildly dilated.   Review of Systems: [y] = yes, _0  = no  Patient is intubated. Therefore history has been obtained from chart review.   General: Weight gain _1 ; Weight loss _2 ; Anorexia _3 ; Fatigue _4 ; Fever _5 ; Chills _6 ; Weakness _7   Cardiac: Chest pain/pressure _8 ; Resting SOB [ y]; Exertional SOB Blue.Reese ]; Orthopnea _9 ; Pedal Edema _10 ; Palpitations _11 ; Syncope _12 ; Presyncope _13 ; Paroxysmal nocturnal dyspnea[ y]  Pulmonary: Cough _14 ; Wheezing_15 ; Hemoptysis_16 ; Sputum _17 ; Snoring _18   GI: Vomiting_19 ; Dysphagia_20 ; Melena_21 ; Hematochezia _22 ; Heartburn_23 ; Abdominal pain _24 ; Constipation _25 ; Diarrhea _26 ; BRBPR _27   GU: Hematuria_28 ; Dysuria _29 ; Nocturia_30   Vascular: Pain in legs with walking _31 ; Pain in feet with lying flat _32 ; Non-healing sores _33 ; Stroke _34 ; TIA _35 ; Slurred speech _36 ;  Neuro: Headaches_37 ; Vertigo_38 ; Seizures_39 ; Paresthesias_40 ;Blurred vision _41 ; Diplopia _42 ; Vision changes _43   Ortho/Skin: Arthritis [ y]; Joint pain Blue.Reese ]; Muscle pain _44 ; Joint swelling _45 ;  Back Pain _46 ; Rash _47   Psych: Depression_48 ; Anxiety_49   Heme: Bleeding problems _50 ; Clotting disorders _51 ; Anemia _52   Endocrine: Diabetes [ y]; Thyroid dysfunction_53   Home Medications Prior to Admission medications   Medication Sig Start Date End Date Taking? Authorizing Provider  acetaminophen (TYLENOL) 650 MG CR tablet Take 1,300 mg by mouth 2 (two) times daily.    Yes [provider]  albuterol (PROVENTIL HFA;VENTOLIN HFA) 108 (90 Base) MCG/ACT inhaler Inhale 2 puffs into the lungs every 6 (six) hours as needed for wheezing or shortness of breath.   Yes [provider]  amiodarone (PACERONE) 200 MG tablet Take 200 mg by mouth daily. Reported on 12/09/2015   Yes [provider]  amLODipine (NORVASC) 10 MG tablet Take 10 mg by mouth daily. Reported on 12/09/2015   Yes [provider]  aspirin 325 MG tablet Take 325 mg by mouth daily.   Yes [provider]  atorvastatin (LIPITOR) 40 MG tablet Take 1 tablet (40 mg total) by mouth daily. 04/16/15  Yes Croitoru, Mihai, MD  Cholecalciferol (VITAMIN D-3) 1000 UNITS CAPS Take 1 capsule by mouth daily.   Yes [provider]  citalopram (CELEXA) 20 MG tablet Take 20 mg by mouth daily.   Yes [provider]  Febuxostat 80 MG  TABS Take 80 mg by mouth daily.   Yes [provider]  gabapentin (NEURONTIN) 300 MG capsule Take 300 mg by mouth 2 (two) times daily.   Yes [provider]  hydrALAZINE (APRESOLINE) 25 MG tablet Take 25 mg by mouth 3 (three) times daily.   Yes [provider]  Insulin Glargine (TOUJEO SOLOSTAR) 300 UNIT/ML SOPN Inject 85 Units into the skin 2 (two) times daily.    Yes [provider]  lidocaine (LIDODERM) 5 % Place 1 patch onto the skin daily as needed (back pain). Remove & Discard patch within 12 hours or as directed by MD   Yes [provider]  Liraglutide (VICTOZA) 18 MG/3ML SOPN Inject 1.8 mg into the skin daily.   Yes  [provider]  meclizine (ANTIVERT) 25 MG tablet Take 1 tablet (25 mg total) by mouth 2 (two) times daily. 06/07/17  Yes Fredia Sorrow, MD  Melatonin 3 MG CAPS Take 1 capsule by mouth at bedtime as needed (sleep).    Yes [provider]  Menthol, Topical Analgesic, (BIOFREEZE EX) Apply 1 application topically 3 (three) times daily as needed (pain). Reported on 10/27/2015   Yes [provider]  methylcellulose (ARTIFICIAL TEARS) 1 % ophthalmic solution Place 1 drop into both eyes 2 (two) times daily as needed (dry eyes).    Yes [provider]  metolazone (ZAROXOLYN) 2.5 MG tablet Take 2.5 mg by mouth daily.    Yes [provider]  metoprolol succinate (TOPROL-XL) 100 MG 24 hr tablet Take 100 mg by mouth daily. Take with or immediately following a meal.   Yes [provider]  ondansetron (ZOFRAN ODT) 4 MG disintegrating tablet Take 1 tablet (4 mg total) by mouth every 8 (eight) hours as needed for nausea or vomiting. 06/07/17  Yes Fredia Sorrow, MD  potassium chloride SA (K-DUR,KLOR-CON) 20 MEQ tablet Take 20-30 mEq by mouth See admin instructions. Take 2 tab (58mq) every morning and 1.5 tab (30 mEq) every evening   Yes [provider]  ranitidine (ZANTAC) 150 MG tablet Take 150 mg by mouth 2 (two) times daily.   Yes [provider]  sennosides-docusate sodium (SENOKOT-S) 8.6-50 MG tablet Take 1 tablet by mouth at bedtime as needed and may repeat dose one time if needed for constipation.    Yes [provider]  Skin Protectants, Misc. (EUCERIN) cream Apply 1 application topically 2 (two) times daily.   Yes [provider]  torsemide (DEMADEX) 100 MG tablet Take 1 tablet (100 mg total) by mouth 2 (two) times daily. 07/08/15  Yes Croitoru, Mihai, MD    Past Medical History: Past Medical History:  Diagnosis Date  . Arthritis    "all over" (06/23/2013)  . Atrial fibrillation ([place]    not Candidate for  Coumadin d/t rectal bleeding  . Benign prostatic hypertrophy   . Cardiomyopathy, hypertensive ([place]     diastolic dysfunction by 21884echo  . Coronary artery disease     nonobstructive. 2 vessel. cardiac catheter in March 2005 showing 60% LAD occlusion, 30% circumflex occlusion.  . Degenerative joint disease     Right knee.  . Diabetes type 2, uncontrolled ([place]   . Diabetic peripheral neuropathy ([place]   . Diabetic ulcer of thigh ([place]    H/O Rt thigh ulcer.  . Exertional shortness of breath    "comes and goes" (06/23/2013)  . GERD (gastroesophageal reflux disease)   . Gout   . Herpes   . Hyperlipidemia   .  Hypertension   . Hypotension 09/05/2013  . Incarcerated ventral hernia    H/O. S/P repair ventral hernia repair 07/2005.  Marland Kitchen LBBB (left bundle branch block)   . Legally blind   . Morbid obesity ([place])   . Obstructive sleep apnea    On CPAP. Last sleep study (2009/07/01) - Severe obstructive sleep apnea/hypopnea syndrome, apnea-hypopnea index 70.5 per hour with non positional events moderately loud snoring and oxygen desaturation to a nadir of 85% on room air.  . Occult blood positive stool     History of in August 2007.  colonoscopy in January 2008 showing normal colon with fair inadequate prep. By Dr. Silvano Rusk.  . 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 Sepsis ([place]) 08/26/2013    Past Surgical History: Past Surgical History:  Procedure Laterality Date  . AMPUTATION Left 06/22/2013   Procedure: AMPUTATION FOOT;  Surgeon: Newt Minion, MD;  Location: [facility];  Service: Orthopedics;  Laterality: Left;  Left Midfoot Amputation  . CARDIAC CATHETERIZATION  10-30-03   Dilatation of the LV with preserved LV systolic function. Tortous aorta, but no AAA. Moderate disease with 50-60% area of narrowing in the circumflex after the second OM and 70% mid narrowing in the LAD. No intervention.  Marland Kitchen CARDIOVASCULAR STRESS TEST  01/23/2010   Perfusion defect seen in inferior myocardial  region is consistent with diaphragmatic attenuation. Remaining myocardium demonstrates normal myocardial perfusion with no evidence of ischemia or infarct. Stress ECG was non-diagnostic due to LBBB.  Marland Kitchen CATARACT EXTRACTION, BILATERAL Bilateral   . ESOPHAGOGASTRODUODENOSCOPY N/A 10/12/2013   Procedure: ESOPHAGOGASTRODUODENOSCOPY (EGD);  Surgeon: Gwenyth Ober, MD;  Location: [facility];  Service: General;  Laterality: N/A;  bedside  . EYE SURGERY    . 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 HERNIA REPAIR    . INSERT / REPLACE / REMOVE PACEMAKER  30-Oct-2003   Secondary to symptomatic bradycardia. DDDR pacemaker. By Dr. Rollene Fare.  Randolm Idol / REPLACE / REMOVE PACEMAKER  Oct 29, 2012   "old pacemaker died; had new one put in" (2013/07/01)  . Pacemaker lead revision  12/2003    likely microdislodgment  of right ventricular lead  . PEG PLACEMENT N/A 10/12/2013   Procedure: PERCUTANEOUS ENDOSCOPIC GASTROSTOMY (PEG) PLACEMENT;  Surgeon: Gwenyth Ober, MD;  Location: [facility];  Service: General;  Laterality: N/A;  . PERMANENT PACEMAKER GENERATOR CHANGE N/A 11/03/2012   Procedure: PERMANENT PACEMAKER GENERATOR CHANGE;  Surgeon: Sanda Klein, MD;  Location: [facility];  Service: Cardiovascular;  Laterality: N/A;  . REFRACTIVE SURGERY Bilateral   . TRANSTHORACIC ECHOCARDIOGRAM  01/01/2013   EF 50-55%, ventricular septum-thickness moderately increased  . VENOUS DOPPLER  01/17/2013   No evidence of thrombus or thrombophlebitis. Left GSVs appear enlarged and demonstrate valvular insuffiiciency. Bilateral SSVs appeared patent with no valvular insufficiency seen.  Marland Kitchen VENTRAL HERNIA REPAIR  07/2005    S/P laparotomy, lysis of adhesions, repair of incarcerated ventral hernia with  partial omentectomy.. Performed by Dr. Marlou Starks.    Family History: Family History  Problem Relation Age of Onset  . Coronary artery disease Sister   . Heart disease Sister   . Allergies Sister   .  Asthma Brother   . Rheum arthritis Brother     Social History: Social History   Social History  . Marital status: Single    Spouse name: N/A  . Number of children: Y  . Years of education: N/A   Occupational History  .  disabled    Social History Main Topics  . Smoking status: Former Smoker    Packs/day: 0.00    Years: 0.00    Types: Cigarettes    Quit date: 08/17/2003  . Smokeless tobacco: Never Used     Comment: started at age 16.  only smoked on occ- very rarely.0  . Alcohol use Yes     Comment: 06/23/2013 "drank some; never had a problem w/it"  . Drug use: No  . Sexual activity: No   Other Topics Concern  . None   Social History Narrative  . None    Allergies:  No Known Allergies  Objective:    Vital Signs:   Temp:  [98.7 F (37.1 C)] 98.7 F (37.1 C) (10/29 0805) Pulse Rate:  [55-78] 59 (10/29 1400) Resp:  [16-27] 22 (10/29 1400) BP: (118-180)/(66-98) 143/96 (10/29 1400) SpO2:  [91 %-98 %] 91 % (10/29 1400) FiO2 (%):  [80 %-100 %] 80 % (10/29 1105)    Weight change: There were no vitals filed for this visit.  Intake/Output:  No intake or output data in the 24 hours ending 06/13/17 1403    Physical Exam   Patient is encephalopathic and or intubated. Therefore history has been obtained from chart review.  General:  Intubated sedated.  HEENT: ET tube  Neck: supple. JVP does not appear elevated but hard to know with body habitus . Carotids 2+ bilat; no bruits. No lymphadenopathy or thyromegaly appreciated. Cor: PMI nonpalpable. Distant HS. Regular rate & rhythm. No rubs, gallops or murmurs. Lungs: mechanical breath sounds  Abdomen: obese, soft, nontender, nondistended. No hepatosplenomegaly. No bruits or masses. Good bowel sounds. Extremities: no cyanosis, clubbing, rash, edema. L foot transmetatarsal amputation  Neuro: intubated/sedated.  Telemetry  Sinus Brady 60s   EKG    Sinus Rhythm  73 bpm   Labs   Basic Metabolic Panel:  Recent  Labs Lab 06/07/17 2024 06/13/17 0806  NA 137 135  K 3.1* 5.1  CL 92* 90*  CO2 33* 30  GLUCOSE 149* 166*  BUN 62* 83*  CREATININE 2.49* 3.30*  CALCIUM 9.2 8.8*    Liver Function Tests:  Recent Labs Lab 06/13/17 0806  AST 78*  ALT 97*  ALKPHOS 104  BILITOT 1.8*  PROT 8.1  ALBUMIN 3.2*   No results for input(s): LIPASE, AMYLASE in the last 168 hours. No results for input(s): AMMONIA in the last 168 hours.  CBC:  Recent Labs Lab 06/07/17 2024 06/13/17 0806  WBC 12.2* 18.2*  NEUTROABS  --  14.9*  HGB 14.9 14.3  HCT 47.3 46.3  MCV 84.3 85.7  PLT 153 192    Cardiac Enzymes:  Recent Labs Lab 06/13/17 0806  TROPONINI <0.03    BNP: BNP (last 3 results)  Recent Labs  02/09/17 1155 03/23/17 1113 06/13/17 0805  BNP 47.5 78.0 158.5*    ProBNP (last 3 results) No results for input(s): PROBNP in the last 8760 hours.   CBG:  Recent Labs Lab 06/07/17 2023  GLUCAP 140*    Coagulation Studies: No results for input(s): LABPROT, INR in the last 72 hours.   Imaging   Dg Chest Port 1 View  Result Date: 06/13/2017 CLINICAL DATA:  Shortness of breath. EXAM: PORTABLE CHEST 1 VIEW COMPARISON:  February 09, 2017 FINDINGS: There is diffuse interstitial pulmonary edema. There is mild alveolar edema in the right base. There is a right pleural effusion. There is cardiomegaly with pulmonary venous hypertension. There is aortic atherosclerosis. Pacemaker leads are  attached to the right atrium and right ventricle. IMPRESSION: Right pleural effusion with diffuse interstitial and patchy alveolar edema. Cardiomegaly with pulmonary venous hypertension. By radiography, these are findings felt to be indicative of a degree of congestive heart failure. Pacemaker leads attached to right atrium and right ventricle. There is aortic atherosclerosis. Aortic Atherosclerosis (ICD10-I70.0). Electronically Signed   By: Lowella Grip III M.D.   On: 06/13/2017 08:35      Medications:      Current Medications: . [START ON 06/14/2017] amiodarone  200 mg Per Tube Daily  . [START ON 06/14/2017] amLODipine  10 mg Oral Daily  . [START ON 06/14/2017] aspirin  325 mg Per Tube Daily  . [START ON 06/14/2017] atorvastatin  40 mg Per Tube Daily  . chlorhexidine gluconate (MEDLINE KIT)  15 mL Mouth Rinse BID  . [START ON 06/14/2017] citalopram  20 mg Per Tube Daily  . famotidine  20 mg Per Tube BID  . heparin  5,000 Units Subcutaneous Q8H  . hydrALAZINE  25 mg Per Tube TID  . [START ON 06/14/2017] insulin glargine  40 Units Subcutaneous Daily  . mouth rinse  15 mL Mouth Rinse QID  . metoprolol tartrate  50 mg Per Tube BID  . Vitamin D-3  1 capsule Oral Daily     Infusions: . 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   Admitted with acute respiratory failure.  Intubated in the ED.    Assessment/Plan   1. Acute Hypoxic Respiratory Failure  Initially on Bipap but later intubated.  2. Chronic Diastolic Heart Failure  -ECHO 03/2017 EF 60-65% Grade II DD. BNP is higher than his baseline but could be R heart strain in the setting of respiratory distress.   -Volume status hard to assess given body habitus but does not appear elevated.   -Hold diuretics for now with AKI. Will need to start diuretics later. Prior to admit he was on torsemide 100 mg twice daily and metolazone 2.5 mg  daily.  -Repeat ECHO  3. AKI on CKD Stage III- Creatinine baseline 2.3-2.6  -Creatinine on admit 3.3. ? Dry . Gently hydrate 4. H/O PE 2015 5  ID: WBC 18. ? Viral illness 6 . H/O Symptomatic Bradycardia- PPM 7. H/O ICH 2015     Length of Stay: 0   Darrick Grinder, NP  06/13/2017, 2:03 PM  Advanced Heart Failure Team Pager [phone] (M-F; 7a - 4p)  Please  contact [facility] for night-coverage after hours (4p -7a ) and weekends on [email or web address]   Patient seen and examined with Darrick Grinder, NP. We discussed all aspects of the encounter. I agree with the assessment and plan as stated above.   He is critically ill with acute respiratory failure. CXR looks like severe pulmonary edema (viewed personally). However on exam does not appear markedly volume overloaded and renal function worse. WBC up. Suspect likely viral illness. Will hydrate gently and see response. If he gets central line can follow CVP as available. Continue supportive care.   Glori Bickers, MD  3:59 PM

## 2017-06-13 NOTE — Procedures (Signed)
Intubation Procedure Note [name] [id number] May 12, 1946  Procedure: Intubation Indications: Respiratory insufficiency  Procedure Details Consent: Risks of procedure as well as the alternatives and risks of each were explained to the (patient/caregiver).  Consent for procedure obtained. Time Out: Verified patient identification, verified procedure, site/side was marked, verified correct patient position, special equipment/implants available, medications/allergies/relevent history reviewed, required imaging and test results available.  Performed  Maximum sterile technique was used including gloves and hand hygiene.  MAC and 3   Given 4 mg versed, 100 mcg fentanyl, 40 mg etomidate, 100 mg rocuronium.  Inserted 7.5 ETT to 23 cm at lip.  Confirmed with CO2 detector, auscultation, and CXR.  Had oxygen desaturation after intubation that improved with increased PEEP.  Ryan Mires, MD [facility] Pulmonary/Critical Care 06/13/2017, 2:36 PM Pager:  [phone] After 3pm call: [phone]

## 2017-06-13 NOTE — ED Notes (Signed)
Family at bedside. 

## 2017-06-13 NOTE — Progress Notes (Signed)
ABG results called to CCM Dr. Halford Chessman. Per MD only change at this time is respiratory rate to 18. All other settings are to stay the same. Vitals are stable and sats are 92%. RT will continue to monitor.

## 2017-06-13 NOTE — Progress Notes (Signed)
Pt transported to ICU via vent w/ no apparent complications.  ICU RT given report.

## 2017-06-13 NOTE — ED Provider Notes (Signed)
[facility] EMERGENCY DEPARTMENT Provider Note   CSN: [id number] Arrival date & time: 06/13/17  7169     History   Chief Complaint Chief Complaint  Patient presents with  . Respiratory Distress    HPI [name] is a 71 y.o. male.  HPI  Patient presents in respiratory distress via EMS. Patient awakens easily, but speaks softly, briefly. He denies pain, cannot specify when he began feeling short of breath, but notes that he does feel slightly better after EMS providers initiated breathing treatments. Per EMS report the patient was in respiratory distress on their arrival, with minimally audible breath sounds, but was attempting to use his only noninvasive ventilatory support mechanism. Patient was reportedly somnolent, but arousable with physical stimuli.  Level 5 caveat secondary to acuity of condition.   Past Medical History:  Diagnosis Date  . Arthritis    "all over" (06/23/2013)  . Atrial fibrillation ([place])    not Candidate for Coumadin d/t rectal bleeding  . Benign prostatic hypertrophy   . Cardiomyopathy, hypertensive ([place])     diastolic dysfunction by 6789 echo  . Coronary artery disease     nonobstructive. 2 vessel. cardiac catheter in March 2005 showing 60% LAD occlusion, 30% circumflex occlusion.  . Degenerative joint disease     Right knee.  . Diabetes type 2, uncontrolled ([place])   . Diabetic peripheral neuropathy ([place])   . Diabetic ulcer of thigh ([place])    H/O Rt thigh ulcer.  . Exertional shortness of breath    "comes and goes" (06/23/2013)  . GERD (gastroesophageal reflux disease)   . Gout   . Herpes   . Hyperlipidemia   . Hypertension   . Hypotension 09/05/2013  . Incarcerated ventral hernia    H/O. S/P repair ventral hernia repair 07/2005.  Marland Kitchen LBBB (left bundle branch block)   . Legally blind   . Morbid obesity ([place])   . Obstructive sleep apnea    On CPAP. Last sleep study (06/2009) - Severe obstructive sleep  apnea/hypopnea syndrome, apnea-hypopnea index 70.5 per hour with non positional events moderately loud snoring and oxygen desaturation to a nadir of 85% on room air.  . Occult blood positive stool     History of in August 2007.  colonoscopy in January 2008 showing normal colon with fair inadequate prep. By Dr. Silvano Rusk.  . 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 Sepsis ([place]) 08/26/2013    Patient Active Problem List   Diagnosis Date Noted  . SSS (sick sinus syndrome) ([place]) 05/19/2016  . Cough 08/20/2015  . Chronic diastolic CHF (congestive heart failure) ([place]) 07/28/2015  . Morbid obesity ([place]) 07/09/2015  . [place] thrombus of heart 10/08/2013  . Cardiac mass 10/04/2013  . CVA (cerebrovascular accident due to intracerebral hemorrhage) ([place]) 10/02/2013  . CVA (cerebral infarction) 10/01/2013  . Intracerebral hemorrhage ([place]) 10/01/2013  . Hypertension 10/01/2013  . ICH (intracerebral hemorrhage) ([place]) 10/01/2013  . Right knee pain 09/07/2013  . CKD (chronic kidney disease) stage 3, GFR 30-59 ml/min ([facility]) 09/07/2013  . Orchitis 09/05/2013  . Unspecified protein-calorie malnutrition ([place]) 09/05/2013  . Sepsis ([place]) 08/27/2013  . Trichomonas infection 08/27/2013  . Epididymo-orchitis, acute 08/27/2013  . Atrial fibrillation ([place]) 06/24/2013  . OSA (obstructive sleep apnea) 05/04/2011  . Shortness of breath 03/17/2011  . Depression 10/23/2010  . Degenerative joint disease   . Cardiomyopathy, hypertensive ([place])   . Coronary artery disease involving native coronary artery of native heart without angina pectoris   .  Pacemaker   . Incarcerated ventral hernia   . COPD (chronic obstructive pulmonary disease) ([place]) 11/28/2009  . Type 2 diabetes mellitus, uncontrolled ([place]) 07/20/2006  . Hyperlipidemia 07/20/2006  . GOUT 07/20/2006  . MACULAR DEGENERATION 07/20/2006  . Essential hypertension 07/20/2006  . BENIGN PROSTATIC HYPERTROPHY, HX OF 07/20/2006    Past Surgical  History:  Procedure Laterality Date  . AMPUTATION Left 06/22/2013   Procedure: AMPUTATION FOOT;  Surgeon: Newt Minion, MD;  Location: [facility];  Service: Orthopedics;  Laterality: Left;  Left Midfoot Amputation  . CARDIAC CATHETERIZATION  10/22/2003   Dilatation of the LV with preserved LV systolic function. Tortous aorta, but no AAA. Moderate disease with 50-60% area of narrowing in the circumflex after the second OM and 70% mid narrowing in the LAD. No intervention.  Marland Kitchen CARDIOVASCULAR STRESS TEST  01/23/2010   Perfusion defect seen in inferior myocardial region is consistent with diaphragmatic attenuation. Remaining myocardium demonstrates normal myocardial perfusion with no evidence of ischemia or infarct. Stress ECG was non-diagnostic due to LBBB.  Marland Kitchen CATARACT EXTRACTION, BILATERAL Bilateral   . ESOPHAGOGASTRODUODENOSCOPY N/A 10/12/2013   Procedure: ESOPHAGOGASTRODUODENOSCOPY (EGD);  Surgeon: Gwenyth Ober, MD;  Location: [facility];  Service: General;  Laterality: N/A;  bedside  . EYE SURGERY    . 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 HERNIA REPAIR    . INSERT / REPLACE / REMOVE PACEMAKER  Nov 08, 2003   Secondary to symptomatic bradycardia. DDDR pacemaker. By Dr. Rollene Fare.  Randolm Idol / REPLACE / REMOVE PACEMAKER  07-Nov-2012   "old pacemaker died; had new one put in" (June 24, 2013)  . Pacemaker lead revision  12/2003    likely microdislodgment  of right ventricular lead  . PEG PLACEMENT N/A 10/12/2013   Procedure: PERCUTANEOUS ENDOSCOPIC GASTROSTOMY (PEG) PLACEMENT;  Surgeon: Gwenyth Ober, MD;  Location: [facility];  Service: General;  Laterality: N/A;  . PERMANENT PACEMAKER GENERATOR CHANGE N/A 11/03/2012   Procedure: PERMANENT PACEMAKER GENERATOR CHANGE;  Surgeon: Sanda Klein, MD;  Location: [facility] CATH LAB;  Service: Cardiovascular;  Laterality: N/A;  . REFRACTIVE SURGERY Bilateral   . TRANSTHORACIC ECHOCARDIOGRAM  01/01/2013   EF 50-55%, ventricular  septum-thickness moderately increased  . VENOUS DOPPLER  01/17/2013   No evidence of thrombus or thrombophlebitis. Left GSVs appear enlarged and demonstrate valvular insuffiiciency. Bilateral SSVs appeared patent with no valvular insufficiency seen.  Marland Kitchen VENTRAL HERNIA REPAIR  07/2005    S/P laparotomy, lysis of adhesions, repair of incarcerated ventral hernia with  partial omentectomy.. Performed by Dr. Marlou Starks.       Home Medications    Prior to Admission medications   Medication Sig Start Date End Date Taking? Authorizing Provider  acetaminophen (TYLENOL) 650 MG CR tablet Take 1,300 mg by mouth 2 (two) times daily.     [provider]  albuterol (PROVENTIL HFA;VENTOLIN HFA) 108 (90 Base) MCG/ACT inhaler Inhale 2 puffs into the lungs every 6 (six) hours as needed for wheezing or shortness of breath.    [provider]  amiodarone (PACERONE) 200 MG tablet Take 200 mg by mouth daily. Reported on 12/09/2015    [provider]  amLODipine (NORVASC) 10 MG tablet Take 10 mg by mouth daily. Reported on 12/09/2015    [provider]  aspirin 325 MG tablet Take 325 mg by mouth daily.    [provider]  atorvastatin (LIPITOR) 40 MG tablet Take 1 tablet (40 mg total) by mouth daily. 04/16/15   Croitoru, Biron,  MD  Cholecalciferol (VITAMIN D-3) 1000 UNITS CAPS Take 1 capsule by mouth daily.    [provider]  citalopram (CELEXA) 20 MG tablet Take 20 mg by mouth daily.    [provider]  colchicine 0.6 MG tablet Take 0.3 mg by mouth daily.     [provider]  Febuxostat 80 MG TABS Take 80 mg by mouth daily.    [provider]  gabapentin (NEURONTIN) 300 MG capsule Take 300 mg by mouth 2 (two) times daily.    [provider]  hydrALAZINE (APRESOLINE) 25 MG tablet Take 25 mg by mouth 3 (three) times daily.    [provider]  Insulin Glargine (TOUJEO SOLOSTAR) 300 UNIT/ML SOPN Inject 85 Units into the skin 2  (two) times daily.     [provider]  lidocaine (LIDODERM) 5 % Place 1 patch onto the skin daily as needed (back pain). Remove & Discard patch within 12 hours or as directed by MD    [provider]  Liraglutide (VICTOZA) 18 MG/3ML SOPN Inject 1.8 mg into the skin daily.    [provider]  meclizine (ANTIVERT) 25 MG tablet Take 1 tablet (25 mg total) by mouth 2 (two) times daily. 06/07/17   Fredia Sorrow, MD  Melatonin 3 MG CAPS Take 1 capsule by mouth at bedtime as needed (sleep).     [provider]  Menthol, Topical Analgesic, (BIOFREEZE EX) Apply 1 application topically daily as needed (pain). Reported on 10/27/2015    [provider]  methylcellulose (ARTIFICIAL TEARS) 1 % ophthalmic solution Place 1 drop into both eyes 2 (two) times daily.    [provider]  metolazone (ZAROXOLYN) 2.5 MG tablet Take 2.5 mg by mouth daily.     [provider]  metoprolol succinate (TOPROL-XL) 100 MG 24 hr tablet Take 100 mg by mouth daily. Take with or immediately following a meal.    [provider]  ondansetron (ZOFRAN ODT) 4 MG disintegrating tablet Take 1 tablet (4 mg total) by mouth every 8 (eight) hours as needed for nausea or vomiting. 06/07/17   Fredia Sorrow, MD  pantoprazole (PROTONIX) 40 MG tablet Take 40 mg by mouth daily.    [provider]  potassium chloride SA (K-DUR,KLOR-CON) 20 MEQ tablet Take 20 mEq by mouth as directed. Take 2 tab (65mEq) every morning and 1 tab (20 mEq) every evening    [provider]  sennosides-docusate sodium (SENOKOT-S) 8.6-50 MG tablet Take 1 tablet by mouth daily as needed for constipation.    [provider]  Skin Protectants, Misc. (EUCERIN) cream Apply 1 application topically 2 (two) times daily.    [provider]  torsemide (DEMADEX) 100 MG tablet Take 1 tablet (100 mg total) by mouth 2 (two) times daily. 07/08/15   Croitoru, Dani Gobble, MD    Family  History Family History  Problem Relation Age of Onset  . Coronary artery disease Sister   . Heart disease Sister   . Allergies Sister   . Asthma Brother   . Rheum arthritis Brother     Social History Social History  Substance Use Topics  . Smoking status: Former Smoker    Packs/day: 0.00    Years: 0.00    Types: Cigarettes    Quit date: 08/17/2003  . Smokeless tobacco: Never Used     Comment: started at age 65.  only smoked on occ- very rarely.0  . Alcohol use Yes     Comment: 06/23/2013 "drank  some; never had a problem w/it"     Allergies   Patient has no known allergies.   Review of Systems Review of Systems  Unable to perform ROS: Acuity of condition     Physical Exam Updated Vital Signs BP 118/87   Pulse 60   Temp 98.7 F (37.1 C) (Temporal)   Resp (!) 23   SpO2 95%   Physical Exam  Constitutional: He is oriented to person, place, and time. He appears well-developed and well-nourished. He appears listless. No distress.  Morbidly obese M in resp distress  HENT:  Head: Normocephalic and atraumatic.  Eyes: Conjunctivae and EOM are normal.  Cardiovascular: Normal rate and regular rhythm.   Pulmonary/Chest: Accessory muscle usage present. No stridor. Tachypnea noted. He is in respiratory distress. He has decreased breath sounds.  Abdominal: There is no tenderness.  Musculoskeletal: He exhibits no edema.  Neurological: He is oriented to person, place, and time. He appears listless.  Skin: Skin is warm and dry.  Psychiatric: He has a normal mood and affect.  Nursing note and vitals reviewed.    ED Treatments / Results  Labs (all labs ordered are listed, but only abnormal results are displayed) Labs Reviewed  COMPREHENSIVE METABOLIC PANEL - Abnormal; Notable for the following:       Result Value   Chloride 90 (*)    Glucose, Bld 166 (*)    BUN 83 (*)    Creatinine, Ser 3.30 (*)    Calcium 8.8 (*)    Albumin 3.2 (*)    AST 78 (*)    ALT 97 (*)     Total Bilirubin 1.8 (*)    GFR calc non Af Amer 18 (*)    GFR calc Af Amer 20 (*)    All other components within normal limits  CBC WITH DIFFERENTIAL/PLATELET - Abnormal; Notable for the following:    WBC 18.2 (*)    RDW 16.5 (*)    Neutro Abs 14.9 (*)    Monocytes Absolute 1.7 (*)    All other components within normal limits  BRAIN NATRIURETIC PEPTIDE - Abnormal; Notable for the following:    B Natriuretic Peptide 158.5 (*)    All other components within normal limits  TROPONIN I    EKG  EKG Interpretation  Date/Time:  Monday June 13 2017 08:03:38 EDT Ventricular Rate:  73 PR Interval:    QRS Duration: 119 QT Interval:  451 QTC Calculation: 497 R Axis:   -3 Text Interpretation:  Sinus or ectopic atrial rhythm Baseline wander Artifact Premature atrial complexes Abnormal ekg Confirmed by Carmin Muskrat [phone]) on 06/13/2017 8:38:03 AM       EMS rhythm strip with narrow complex tachycardia, inconsistent rhythm, but with occasional atrial pacing.  Radiology Dg Chest Port 1 View  Result Date: 06/13/2017 CLINICAL DATA:  Shortness of breath. EXAM: PORTABLE CHEST 1 VIEW COMPARISON:  February 09, 2017 FINDINGS: There is diffuse interstitial pulmonary edema. There is mild alveolar edema in the right base. There is a right pleural effusion. There is cardiomegaly with pulmonary venous hypertension. There is aortic atherosclerosis. Pacemaker leads are attached to the right atrium and right ventricle. IMPRESSION: Right pleural effusion with diffuse interstitial and patchy alveolar edema. Cardiomegaly with pulmonary venous hypertension. By radiography, these are findings felt to be indicative of a degree of congestive heart failure. Pacemaker leads attached to right atrium and right ventricle. There is aortic atherosclerosis. Aortic Atherosclerosis (ICD10-I70.0). Electronically Signed   By: Lowella Grip III M.D.  On: 06/13/2017 08:35    Procedures Procedures (including critical care  time)  Medications Ordered in ED Medications - No data to display   Initial Impression / Assessment and Plan / ED Course  I have reviewed the triage vital signs and the nursing notes.  Pertinent labs & imaging results that were available during my care of the patient were reviewed by me and considered in my medical decision making (see chart for details).   After arrival the patient was transitioned to our noninvasive ventilatory support, initiated BiPAP. Patient continued to receive breathing treatments.     8:39 AM  Patient improved, now opening his eyes voluntarily.  11:30 AM Patient substantially better, now ready for transition from a BiPAP. Patient has received multiple breathing treatments, has diminished respiratory difficulty. With concern for CHF and COPD contributing to his dyspnea, the patient has received steroids, Lasix, though there is notation taken of his worsening renal function.  12:48 PM Patient has not been successfully weaned from BiPAP, and indeed is using additional pressure support. However, he does awaken more easily, answers questions appropriately. Given concern for his continued need of noninvasive ventilatory support, inability to wean I discussed his case with our critical care colleagues to assist with admission.  Final Clinical Impressions(s) / ED Diagnoses  Respiratory distress Acute kidney injury  CRITICAL CARE Performed by: Carmin Muskrat Total critical care time: 45 minutes Critical care time was exclusive of separately billable procedures and treating other patients. Critical care was necessary to treat or prevent imminent or life-threatening deterioration. Critical care was time spent personally by me on the following activities: development of treatment plan with patient and/or surrogate as well as nursing, discussions with consultants, evaluation of patient's response to treatment, examination of patient, obtaining history from patient  or surrogate, ordering and performing treatments and interventions, ordering and review of laboratory studies, ordering and review of radiographic studies, pulse oximetry and re-evaluation of patient's condition.    Carmin Muskrat, MD 06/13/17 1249

## 2017-06-13 NOTE — ED Notes (Signed)
CCM at bedside after speaking with family decided to plan to intubate for air way protection and dropping sats.

## 2017-06-14 ENCOUNTER — Inpatient Hospital Stay (HOSPITAL_COMMUNITY): Payer: Medicare (Managed Care)

## 2017-06-14 DIAGNOSIS — L0390 Cellulitis, unspecified: Secondary | ICD-10-CM

## 2017-06-14 DIAGNOSIS — G9340 Encephalopathy, unspecified: Secondary | ICD-10-CM

## 2017-06-14 DIAGNOSIS — M7989 Other specified soft tissue disorders: Secondary | ICD-10-CM

## 2017-06-14 DIAGNOSIS — I361 Nonrheumatic tricuspid (valve) insufficiency: Secondary | ICD-10-CM

## 2017-06-14 DIAGNOSIS — J810 Acute pulmonary edema: Secondary | ICD-10-CM

## 2017-06-14 LAB — ECHOCARDIOGRAM COMPLETE
AO mean calculated velocity dopler: 198 cm/s
AV Area VTI index: 0.59 cm2/m2
AV Area VTI: 2 cm2
AV Area mean vel: 1.86 cm2
AV Mean grad: 19 mmHg
AV Peak grad: 34 mmHg
AV VEL mean LVOT/AV: 0.35
AV area mean vel ind: 0.65 cm2/m2
AV peak Index: 0.7
AV pk vel: 290 cm/s
AV vel: 1.7
Ao pk vel: 0.38 m/s
Area-P 1/2: 3.73 cm2
E decel time: 201 msec
FS: 31 % (ref 28–44)
IVS/LV PW RATIO, ED: 1.07
LA ID, A-P, ES: 36 mm
LA diam end sys: 36 mm
LA diam index: 1.26 cm/m2
LA vol A4C: 55.5 ml
LA vol index: 19.7 mL/m2
LA vol: 56.5 mL
LV PW d: 14 mm — AB (ref 0.6–1.1)
LVOT SV: 115 mL
LVOT VTI: 21.6 cm
LVOT area: 5.31 cm2
LVOT diameter: 26 mm
LVOT peak VTI: 0.32 cm
LVOT peak grad rest: 5 mmHg
LVOT peak vel: 109 cm/s
MV Dec: 201
MV Peak grad: 5 mmHg
MV pk A vel: 95.1 m/s
MV pk E vel: 110 m/s
P 1/2 time: 59 ms
VTI: 67.4 cm
Valve area index: 0.59
Valve area: 1.7 cm2
Weight: 5396.86 oz

## 2017-06-14 LAB — POCT I-STAT 3, ART BLOOD GAS (G3+)
Acid-Base Excess: 10 mmol/L — ABNORMAL HIGH (ref 0.0–2.0)
BICARBONATE: 33.4 mmol/L — AB (ref 20.0–28.0)
O2 Saturation: 98 %
PCO2 ART: 38.8 mmHg (ref 32.0–48.0)
Patient temperature: 98.6
TCO2: 35 mmol/L — AB (ref 22–32)
pH, Arterial: 7.543 — ABNORMAL HIGH (ref 7.350–7.450)
pO2, Arterial: 88 mmHg (ref 83.0–108.0)

## 2017-06-14 LAB — GLUCOSE, CAPILLARY
GLUCOSE-CAPILLARY: 216 mg/dL — AB (ref 65–99)
GLUCOSE-CAPILLARY: 238 mg/dL — AB (ref 65–99)
Glucose-Capillary: 150 mg/dL — ABNORMAL HIGH (ref 65–99)
Glucose-Capillary: 171 mg/dL — ABNORMAL HIGH (ref 65–99)
Glucose-Capillary: 181 mg/dL — ABNORMAL HIGH (ref 65–99)
Glucose-Capillary: 219 mg/dL — ABNORMAL HIGH (ref 65–99)
Glucose-Capillary: 235 mg/dL — ABNORMAL HIGH (ref 65–99)

## 2017-06-14 LAB — BASIC METABOLIC PANEL
ANION GAP: 13 (ref 5–15)
BUN: 82 mg/dL — ABNORMAL HIGH (ref 6–20)
CHLORIDE: 93 mmol/L — AB (ref 101–111)
CO2: 31 mmol/L (ref 22–32)
Calcium: 8.8 mg/dL — ABNORMAL LOW (ref 8.9–10.3)
Creatinine, Ser: 2.55 mg/dL — ABNORMAL HIGH (ref 0.61–1.24)
GFR calc Af Amer: 28 mL/min — ABNORMAL LOW (ref >60)
GFR calc non Af Amer: 24 mL/min — ABNORMAL LOW (ref >60)
GLUCOSE: 152 mg/dL — AB (ref 65–99)
Potassium: 3.4 mmol/L — ABNORMAL LOW (ref 3.5–5.1)
Sodium: 137 mmol/L (ref 135–145)

## 2017-06-14 LAB — COMPREHENSIVE METABOLIC PANEL
ALT: 84 U/L — AB (ref 17–63)
AST: 47 U/L — AB (ref 15–41)
Albumin: 2.5 g/dL — ABNORMAL LOW (ref 3.5–5.0)
Alkaline Phosphatase: 98 U/L (ref 38–126)
Anion gap: 14 (ref 5–15)
BILIRUBIN TOTAL: 1.1 mg/dL (ref 0.3–1.2)
BUN: 83 mg/dL — AB (ref 6–20)
CO2: 30 mmol/L (ref 22–32)
CREATININE: 2.85 mg/dL — AB (ref 0.61–1.24)
Calcium: 8.7 mg/dL — ABNORMAL LOW (ref 8.9–10.3)
Chloride: 92 mmol/L — ABNORMAL LOW (ref 101–111)
GFR, EST AFRICAN AMERICAN: 24 mL/min — AB (ref >60)
GFR, EST NON AFRICAN AMERICAN: 21 mL/min — AB (ref >60)
Glucose, Bld: 211 mg/dL — ABNORMAL HIGH (ref 65–99)
Potassium: 3.3 mmol/L — ABNORMAL LOW (ref 3.5–5.1)
Sodium: 136 mmol/L (ref 135–145)
TOTAL PROTEIN: 7.1 g/dL (ref 6.5–8.1)

## 2017-06-14 LAB — CBC
HCT: 40.6 % (ref 39.0–52.0)
Hemoglobin: 12.8 g/dL — ABNORMAL LOW (ref 13.0–17.0)
MCH: 26.1 pg (ref 26.0–34.0)
MCHC: 31.5 g/dL (ref 30.0–36.0)
MCV: 82.9 fL (ref 78.0–100.0)
PLATELETS: 166 10*3/uL (ref 150–400)
RBC: 4.9 MIL/uL (ref 4.22–5.81)
RDW: 16.2 % — AB (ref 11.5–15.5)
WBC: 15.1 10*3/uL — AB (ref 4.0–10.5)

## 2017-06-14 LAB — TROPONIN I: Troponin I: <0.03 ng/mL (ref <0.03)

## 2017-06-14 MED ORDER — DEXMEDETOMIDINE HCL IN NACL 400 MCG/100ML IV SOLN
0.4000 ug/kg/h | INTRAVENOUS | Status: DC
  Administered 2017-06-14: 1 ug/kg/h via INTRAVENOUS
  Administered 2017-06-14: 0.6 ug/kg/h via INTRAVENOUS
  Administered 2017-06-14: 0.4 ug/kg/h via INTRAVENOUS
  Administered 2017-06-15: 0.8 ug/kg/h via INTRAVENOUS
  Administered 2017-06-15: 1 ug/kg/h via INTRAVENOUS
  Administered 2017-06-15: 0.6 ug/kg/h via INTRAVENOUS
  Administered 2017-06-15: 0.8 ug/kg/h via INTRAVENOUS
  Administered 2017-06-15: 1 ug/kg/h via INTRAVENOUS
  Administered 2017-06-15: 0.6 ug/kg/h via INTRAVENOUS
  Administered 2017-06-15: 1 ug/kg/h via INTRAVENOUS
  Administered 2017-06-15: 0.6 ug/kg/h via INTRAVENOUS
  Administered 2017-06-16: 1.2 ug/kg/h via INTRAVENOUS
  Administered 2017-06-16: 1 ug/kg/h via INTRAVENOUS
  Filled 2017-06-14 (×15): qty 100

## 2017-06-14 MED ORDER — PRO-STAT SUGAR FREE PO LIQD
30.0000 mL | Freq: Three times a day (TID) | ORAL | Status: DC
  Administered 2017-06-14 – 2017-06-15 (×5): 30 mL
  Filled 2017-06-14 (×7): qty 30

## 2017-06-14 MED ORDER — FAMOTIDINE 20 MG PO TABS
20.0000 mg | ORAL_TABLET | Freq: Every day | ORAL | Status: DC
  Administered 2017-06-14 – 2017-06-24 (×9): 20 mg
  Filled 2017-06-14 (×10): qty 1

## 2017-06-14 MED ORDER — CEPHALEXIN 250 MG/5ML PO SUSR
500.0000 mg | Freq: Three times a day (TID) | ORAL | Status: DC
  Administered 2017-06-14 – 2017-06-18 (×12): 500 mg
  Filled 2017-06-14 (×13): qty 10

## 2017-06-14 MED ORDER — POTASSIUM CHLORIDE 10 MEQ/100ML IV SOLN
10.0000 meq | INTRAVENOUS | Status: AC
  Administered 2017-06-14 (×3): 10 meq via INTRAVENOUS
  Filled 2017-06-14 (×3): qty 100

## 2017-06-14 MED ORDER — VITAL HIGH PROTEIN PO LIQD
1000.0000 mL | ORAL | Status: DC
  Administered 2017-06-14 – 2017-06-16 (×3): 1000 mL
  Administered 2017-06-16: 10:00:00

## 2017-06-14 MED ORDER — PERFLUTREN LIPID MICROSPHERE
1.0000 mL | INTRAVENOUS | Status: AC | PRN
  Administered 2017-06-14: 6 mL via INTRAVENOUS
  Filled 2017-06-14: qty 10

## 2017-06-14 NOTE — Consult Note (Signed)
[facility] Nurse wound consult note Reason for Consult: LE wound Patient is ventilated, his sister is at the bedside. She report he wears compression stockings at home for management of edema Wound type: venous stasis ulcerations x 2  Pressure Injury POA: NA Measurement: Distal: 3cm x 3.2cm x 0.1cm  Proximal: 1.5cm x 1.0cm x 0.1cm  Wound bed: both are clean, but pale, early granulation Drainage (amount, consistency, odor) minimal, serousanginous, no odor Periwound: intact, hemosiderin staining with some early brawny skin changes.  Has new onset of erthyma proximal to the chronic skin changes noted in venous disease.  Reported new by the bedside nurses. I have marked today to determine if it is progressing.  Has been started on IV antibiotics for coverage.  Dressing procedure/placement/frequency: Silicone foam to protect, insulate, and absorb drainage. Change every 3 days and PRN soilage.  Use compression stockings again when appropriate. Elevate LEs if sitting or lying down.  Discussed POC with patient's sister and bedside nurse.  Re consult if needed, will not follow at this time. Thanks  Jessy Calixte [employer], RN,CWOCN, CNS, [place] [phone])

## 2017-06-14 NOTE — Progress Notes (Signed)
Advanced Heart Failure Rounding Note  PCP:  Primary Cardiologist:   Subjective:    Admitted with acute respiratory failure requiring intubation. WBC trending down 18>15.   Remains intubated.   Objective:   Weight Range: (!) 337 lb 4.9 oz (153 kg) Body mass index is 45.75 kg/m.   Vital Signs:   Temp:  [98.4 F (36.9 C)-98.7 F (37.1 C)] 98.6 F (37 C) (10/30 0731) Pulse Rate:  [50-78] 50 (10/30 0700) Resp:  [16-27] 18 (10/30 0700) BP: (116-180)/(66-98) 135/77 (10/30 0700) SpO2:  [88 %-99 %] 96 % (10/30 0700) FiO2 (%):  [70 %-100 %] 70 % (10/30 0500) Weight:  [337 lb 4.9 oz (153 kg)-343 lb 7.6 oz (155.8 kg)] 337 lb 4.9 oz (153 kg) (10/30 0500)    Weight change: Filed Weights   06/13/17 1800 06/14/17 0500  Weight: (!) 343 lb 7.6 oz (155.8 kg) (!) 337 lb 4.9 oz (153 kg)    Intake/Output:   Intake/Output Summary (Last 24 hours) at 06/14/17 0752 Last data filed at 06/14/17 0700  Gross per 24 hour  Intake          1990.17 ml  Output             2200 ml  Net          -209.83 ml      Physical Exam    General:  Intubated  HEENT: ETT  Neck: Supple. JVP . Carotids 2+ bilat; no bruits. No lymphadenopathy or thyromegaly appreciated. Cor: PMI nondisplaced. Regular rate & rhythm. No rubs, gallops or murmurs. Lungs: Coarse throughout.  Abdomen: obese, soft, nontender, distended. No hepatosplenomegaly. No bruits or masses. Good bowel sounds. Extremities: No cyanosis, clubbing, rash, edema. R and LLE SCds.  LLE partial thickness wound with erythema superior aspect.  Neuro: intubated. Sedated.   Telemetry   NSR 50-60s   EKG    N/A   Labs    CBC  Recent Labs  06/13/17 0806 06/14/17 0145  WBC 18.2* 15.1*  NEUTROABS 14.9*  --   HGB 14.3 12.8*  HCT 46.3 40.6  MCV 85.7 82.9  PLT 192 951   Basic Metabolic Panel  Recent Labs  06/13/17 0806 06/14/17 0145  NA 135 136  K 5.1 3.3*  CL 90* 92*  CO2 30 30  GLUCOSE 166* 211*  BUN 83* 83*  CREATININE  3.30* 2.85*  CALCIUM 8.8* 8.7*   Liver Function Tests  Recent Labs  06/13/17 0806 06/14/17 0145  AST 78* 47*  ALT 97* 84*  ALKPHOS 104 98  BILITOT 1.8* 1.1  PROT 8.1 7.1  ALBUMIN 3.2* 2.5*   No results for input(s): LIPASE, AMYLASE in the last 72 hours. Cardiac Enzymes  Recent Labs  06/13/17 1504 06/13/17 1923 06/14/17 0145  TROPONINI <0.03 <0.03 <0.03    BNP: BNP (last 3 results)  Recent Labs  02/09/17 1155 03/23/17 1113 06/13/17 0805  BNP 47.5 78.0 158.5*    ProBNP (last 3 results) No results for input(s): PROBNP in the last 8760 hours.   D-Dimer No results for input(s): DDIMER in the last 72 hours. Hemoglobin A1C No results for input(s): HGBA1C in the last 72 hours. Fasting Lipid Panel  Recent Labs  06/13/17 1504  TRIG 78   Thyroid Function Tests No results for input(s): TSH, T4TOTAL, T3FREE, THYROIDAB in the last 72 hours.  Invalid input(s): FREET3  Other results:   Imaging    Dg Chest Portable 1 View  Result Date: 06/13/2017 CLINICAL DATA:  Status post intubation. EXAM: PORTABLE CHEST 1 VIEW COMPARISON:  Radiographs of June 13, 2017. FINDINGS: Stable cardiomegaly with central pulmonary vascular congestion. Stable bilateral pulmonary edema is noted. No pneumothorax is noted. Mild bilateral pleural effusions may be present. Right-sided pacemaker is unchanged in position. Endotracheal tube is seen projected over tracheal air shadow with distal tip 6 cm above the carina. Nasogastric tube is seen passing into stomach. Bony thorax is unremarkable. IMPRESSION: Stable cardiomegaly with central pulmonary vascular congestion and bilateral pulmonary edema. Endotracheal and nasogastric tubes are in grossly good position. Electronically Signed   By: Marijo Conception, M.D.   On: 06/13/2017 14:35   Dg Chest Port 1 View  Result Date: 06/13/2017 CLINICAL DATA:  Shortness of breath. EXAM: PORTABLE CHEST 1 VIEW COMPARISON:  February 09, 2017 FINDINGS: There is  diffuse interstitial pulmonary edema. There is mild alveolar edema in the right base. There is a right pleural effusion. There is cardiomegaly with pulmonary venous hypertension. There is aortic atherosclerosis. Pacemaker leads are attached to the right atrium and right ventricle. IMPRESSION: Right pleural effusion with diffuse interstitial and patchy alveolar edema. Cardiomegaly with pulmonary venous hypertension. By radiography, these are findings felt to be indicative of a degree of congestive heart failure. Pacemaker leads attached to right atrium and right ventricle. There is aortic atherosclerosis. Aortic Atherosclerosis (ICD10-I70.0). Electronically Signed   By: Lowella Grip III M.D.   On: 06/13/2017 08:35      Medications:     Scheduled Medications: . amiodarone  200 mg Per Tube Daily  . amLODipine  10 mg Oral Daily  . aspirin  325 mg Per Tube Daily  . atorvastatin  40 mg Per Tube Daily  . chlorhexidine gluconate (MEDLINE KIT)  15 mL Mouth Rinse BID  . cholecalciferol  1,000 Units Oral Daily  . citalopram  20 mg Per Tube Daily  . famotidine  20 mg Per Tube BID  . heparin  5,000 Units Subcutaneous Q8H  . hydrALAZINE  25 mg Per Tube TID  . insulin aspart  0-20 Units Subcutaneous Q4H  . insulin glargine  40 Units Subcutaneous Daily  . mouth rinse  15 mL Mouth Rinse QID  . rocuronium  100 mg Intravenous Once     Infusions: . sodium chloride 50 mL/hr at 06/13/17 2200  . propofol (DIPRIVAN) infusion 38 mcg/kg/min (06/14/17 0611)     PRN Medications:  acetaminophen, fentaNYL (SUBLIMAZE) injection, fentaNYL (SUBLIMAZE) injection, midazolam, ondansetron (ZOFRAN) IV, senna-docusate    Patient Profile   [name] is a 71 year old with history of chronic diastolic heart failure complicated by RV failure, CKD Stage III, symptomatic bradycardia, S/P PPM, ICH 2015, PE 2015, OSA, DM  CAD nonobstructive LHC 2006, morbid obesity, and blindness.   Admitted with acute  respiratory failure.  Intubated in the ED.   Assessment/Plan   1. Acute Hypoxic Respiratory Failure  - Initially on Bipap but later intubated. 2. Chronic Diastolic Heart Failure  - ECHO 03/2017 EF 60-65% Grade II DD, moderate aortic stenosis. BNP is higher than his baseline but could be R heart strain in the setting of respiratory distress.   - Volume status trending up. Off diuretics. Stop IV fluids.  - Will need to start diuretics tomorrow.  - Prior to admit he was on torsemide 100 mg twice daily and metolazone 2.5 mg  daily.  - Repeat ECHO 3. AKI on CKD Stage III- Creatinine baseline 2.3-2.6  - Creatinine on admit 3.3. Received IV fluids  - Creatinine  trending down 3.3>2.85 4. H/O PE 2015 5. ID: WBC 18>15.1  Viral illness 6. H/O Symptomatic Bradycardia- PPM 7. H/O ICH 2015   8. LLE Cellulitis- discussed with CCM. WBC 15.  9. HTN; continue current regimen.  10. Atrial fibrillation: Paroxysmal.  Not anticoagulated given history of intracerebral hemorrhage.  11. Aortic stenosis: Moderate on 8/18 echo.   Length of Stay: 1   [name], NP  06/14/2017, 7:52 AM  Advanced Heart Failure Team Pager [phone] (M-F; 7a - 4p)  Please contact [facility] for night-coverage after hours (4p -7a ) and weekends on [email or web address]  Patient seen with NP, agree with the above note.  Patient with acute hypoxemic respiratory failure, now intubated.  Clinically has not appeared wet but CXR consistent with diffuse pulmonary edema. ?Viral PNA.  Creatinine trending down, weight trending up.  - Would stop IV fluid, will likely need some diuretic later today or tomorrow.  On torsemide 100 mg bid + metolazone 2.5 daily at home.  - Would cover with abx for left lower leg cellulitis.  - Exam is very difficult for volume.  Would be helpful to have some form of central access in order to follow CVP.  - He remains in NSR (or a-paced) on amiodarone (h/o paroxysmal atrial fibrillation).  Not on anticoagulation due  to history of intracerebral hemorrhage.  - Repeat echo.   Loralie Champagne 06/14/2017 8:37 AM

## 2017-06-14 NOTE — Progress Notes (Signed)
  Echocardiogram 2D Echocardiogram has been performed.  [name] [name] 06/14/2017, 3:30 PM

## 2017-06-14 NOTE — Progress Notes (Signed)
PULMONARY / CRITICAL CARE MEDICINE   Name: [name] MRN: [id number] DOB: 1945/12/09    ADMISSION DATE:  06/13/2017 CONSULTATION DATE:  06/13/17  REFERRING MD:  Dr. Vanita Panda / EDP   CHIEF COMPLAINT:  Shortness of Breath   HISTORY OF PRESENT ILLNESS:  71 y/o M who presented to [facility] on 10/29 with reports of acute shortness of breath.    The patient is on BiPAP.  Information obtained from patient (as able), chart review and family at bedside.  The patient lives at home with the assistance of home health aides.  They are in/out of his home frequently during the day.  They assist him with all aspects of care.  In addition, [facility].  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 Family reports that some of the aides are good about keeping him to a low sodium diet, others are not.  They are unsure if he actually follows his fluid restriction.  Daughter thinks he was recently taken off a "blood thinner" but she is unsure (none listed on home medication list).  He does carry a hx of PE and prior anticoagulation. Family (sister, daughter and brother in law deny knowledge of him ever smoking). They state he has been on the vent before but are unsure if he would want it again.   He was recently seen in the ER on 10/23 for dizziness and vomiting that resolved with meclizine and zofran. The patient denies known sick contacts. EMS was called for acute shortness of breath.  He was placed on CPAP and transported to the ER.  In the ER, he was found to be hypoxic requiring BiPAP 16/8 with 100% FiO2 (he dropped to the 80's on 80% O2 per RT).  Initial CXR shows concern for pulmonary edema with pleural effusion.  Initial labs- Na 135, K 5.1, CL 90, CO2 30, BUN 83, CR 3.30 (up from 2.49 on 10/23, baseline 2.1-2.6 in the last year), glucose 166, AST 78 / ALT 97, T.Bili 1.8, Troponin <0.03, WBC 18.2, Hgb 14.3 and platelets 192.  He is unable to describe further symptoms  due to BiPAP mask.   He is followed by the CHF clinic and was most recently seen on 03/23/17 by Oda Kilts, PA-C.  At that time his office weight was 339 lbs.  In 12/2016 he was 329lbs.  On 10/23 in the ER he was 340 lbs.      SUBJECTIVE: Left leg with warmth and erythema.  Not on abx.  Otherwise no acute events.  VITAL SIGNS: BP 137/80   Pulse (!) 50   Temp 98.6 F (37 C) (Oral)   Resp 18   Wt (!) 153 kg (337 lb 4.9 oz)   SpO2 97%   BMI 45.75 kg/m   HEMODYNAMICS:    VENTILATOR SETTINGS: Vent Mode: PRVC FiO2 (%):  [70 %-100 %] 70 % Set Rate:  [12 bmp-20 bmp] 18 bmp Vt Set:  [620 mL] 620 mL PEEP:  [8 cmH20-12 cmH20] 12 cmH20 Plateau Pressure:  [26 cmH20-33 cmH20] 29 cmH20  INTAKE / OUTPUT: I/O last 3 completed shifts: In: 1990.2 [I.V.:1440.2; IV Piggyback:550] Out: 2200 [Urine:2200]  PHYSICAL EXAMINATION: General: chronically ill appearing male, in NAD HEENT: MM pink/dry, ETT in place Neuro: Sedated, does not follow commands CV: s1s2 rrr, no m/r/g, diminished tones  PULM: even/non-labored, lungs bilaterally clear anterior, diminished lower lateral  EX:BMWU, non-tender, bsx4 active  Extremities:  warm/dry, 1+ pitting BLE edema, compression stockings in place, LLE transmetatarsal amputation well healed.  LLE warm with erythema. Skin: Warm, dry.  LLE as above.  LABS:  BMET  Recent Labs Lab 06/07/17 2024 06/13/17 0806 06/14/17 0145  NA 137 135 136  K 3.1* 5.1 3.3*  CL 92* 90* 92*  CO2 33* 30 30  BUN 62* 83* 83*  CREATININE 2.49* 3.30* 2.85*  GLUCOSE 149* 166* 211*    Electrolytes  Recent Labs Lab 06/07/17 2024 06/13/17 0806 06/14/17 0145  CALCIUM 9.2 8.8* 8.7*    CBC  Recent Labs Lab 06/07/17 2024 06/13/17 0806 06/14/17 0145  WBC 12.2* 18.2* 15.1*  HGB 14.9 14.3 12.8*  HCT 47.3 46.3 40.6  PLT 153 192 166    Coag's No results for input(s): APTT, INR in the last 168 hours.  Sepsis Markers No results for input(s): LATICACIDVEN,  PROCALCITON, O2SATVEN in the last 168 hours.  ABG  Recent Labs Lab 06/13/17 1620  PHART 7.468*  PCO2ART 47.8  PO2ART 63.0*    Liver Enzymes  Recent Labs Lab 06/13/17 0806 06/14/17 0145  AST 78* 47*  ALT 97* 84*  ALKPHOS 104 98  BILITOT 1.8* 1.1  ALBUMIN 3.2* 2.5*    Cardiac Enzymes  Recent Labs Lab 06/13/17 1504 06/13/17 1923 06/14/17 0145  TROPONINI <0.03 <0.03 <0.03    Glucose  Recent Labs Lab 06/07/17 2023 06/13/17 1529 06/13/17 1959 06/14/17 0023 06/14/17 0327 06/14/17 0729  GLUCAP 140* 187* 210* 238* 216* 235*    Imaging Dg Chest Portable 1 View  Result Date: 06/13/2017 CLINICAL DATA:  Status post intubation. EXAM: PORTABLE CHEST 1 VIEW COMPARISON:  Radiographs of June 13, 2017. FINDINGS: Stable cardiomegaly with central pulmonary vascular congestion. Stable bilateral pulmonary edema is noted. No pneumothorax is noted. Mild bilateral pleural effusions may be present. Right-sided pacemaker is unchanged in position. Endotracheal tube is seen projected over tracheal air shadow with distal tip 6 cm above the carina. Nasogastric tube is seen passing into stomach. Bony thorax is unremarkable. IMPRESSION: Stable cardiomegaly with central pulmonary vascular congestion and bilateral pulmonary edema. Endotracheal and nasogastric tubes are in grossly good position. Electronically Signed   By: Marijo Conception, M.D.   On: 06/13/2017 14:35   Dg Chest Port 1 View  Result Date: 06/13/2017 CLINICAL DATA:  Shortness of breath. EXAM: PORTABLE CHEST 1 VIEW COMPARISON:  February 09, 2017 FINDINGS: There is diffuse interstitial pulmonary edema. There is mild alveolar edema in the right base. There is a right pleural effusion. There is cardiomegaly with pulmonary venous hypertension. There is aortic atherosclerosis. Pacemaker leads are attached to the right atrium and right ventricle. IMPRESSION: Right pleural effusion with diffuse interstitial and patchy alveolar edema.  Cardiomegaly with pulmonary venous hypertension. By radiography, these are findings felt to be indicative of a degree of congestive heart failure. Pacemaker leads attached to right atrium and right ventricle. There is aortic atherosclerosis. Aortic Atherosclerosis (ICD10-I70.0). Electronically Signed   By: Lowella Grip III M.D.   On: 06/13/2017 08:35     STUDIES:  ECHO 10/29 >>  BLE Venous Duplex 10/29 >>   CULTURES: RVP 10/29 >> neg  ANTIBIOTICS:   SIGNIFICANT EVENTS: 10/29  Admit with acute SOB  LINES/TUBES: ETT 10/29 >   DISCUSSION: 71 y/o M with a complex medical history admitted 10/29 with acute onset SOB.  Suspect decompensated diastolic CHF but viral illness, PE also on differential (given high O2 needs).    ASSESSMENT / PLAN:  PULMONARY A: Acute  Hypoxic Respiratory Failure - in setting of AKI, decompensated CHF, r/o PNA, viral illness, PE OSA - baseline CPAP 12cm H2O Hx PE in 2015 - not on anticoagulation due to rectal bleeding P:   Continue PRVC for now, will hold on wean due to continued 70% FiO2 and 12 PEEP Received empiric abx in ER for possible CAP, will hold further for now Defer diuretic regimen to Cardiology  Assess LE dopplers to r/o PE Follow ABG / CXR Will need CPAP QHS post extubation   CARDIOVASCULAR A:  Diastolic CHF with concern for Acute Decompensation Hx HTN, HLD, CAD, PAF (not on anticoagulation due to rectal bleeding), bradycardia s/p pacemaker  P:  CHF Service consulted, appreciate input  Will defer diuretics to CHF team   Follow echo Continue home norvasc, amiodarone, ASA, lipitor, hydralazine, lopressor  RENAL A:   AKI on CKD - baseline sr cr 2.1-2.6  Hypokalemia - being repleted P:   Trend BMP / urinary output Repeat BMP this PM Replace electrolytes as indicated Avoid nephrotoxic agents, ensure adequate renal perfusion   GASTROINTESTINAL A:   Morbid Obesity  Mild Elevation of LFT's - 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  P:    NPO  Start TF's  HEMATOLOGIC A:   Hx of PE P:  Trend CBC  Heparin for DVT prophylaxis Follow LE duplex, echo - might need systemic anticoagulation  INFECTIOUS A:   Possible LLE cellulitis Rule Out Viral Illness, PNA - RVP neg 10/29 P:   Start keflex 10/30  ENDOCRINE A:   DM - with CKD, resultant blindness    P:   Hold home oral agents SSI Q4 with resistant scale   NEUROLOGIC A:   Acute Metabolic Encephalopathy  P:   RASS goal: 0 to -1  Sedation: Propofol gtt / fentanyl PRN Minimize sedating medications as able  Daily SBT / WUA   FAMILY  - Updates: Daughter, sister and brother-in-law updated at bedside.    - Inter-disciplinary family meet or Palliative Care meeting due by:  11/5  CC time: 35 min.  Montey Hora, [facility] Pager: [phone]  or [phone] 06/14/2017, 8:42 AM  Attending Note:  71 year old male with CHF presenting with cellulitis and respiratory failure due to pulmonary edema and AKI.  On exam, he is unresponsive with clear lungs.  I reviewed CXR myself, ETT ok.  Discussed with PCCM-NP.  Will hold diureses for today.  Continue full vent support.  Start precedex in place of propofol.  Hold off weaning.  Family updated bedside.  The patient is critically ill with multiple organ systems failure and requires high complexity decision making for assessment and support, frequent evaluation and titration of therapies, application of advanced monitoring technologies and extensive interpretation of multiple databases.   Critical Care Time devoted to patient care services described in this note is  35  Minutes. This time reflects time of care of this signee Dr Jennet Maduro. This critical care time does not reflect procedure time, or teaching time or supervisory time of PA/NP/Med student/Med Resident etc but could involve care discussion time.  Rush Farmer, M.D. [facility]  [facility]. Pager: [phone]. After hours pager: [phone].

## 2017-06-14 NOTE — Progress Notes (Signed)
Pt had continuous cuff leak, RT added air to cuff until there was no longer a cuff leak heard. RT will continue to monitor.

## 2017-06-14 NOTE — Progress Notes (Signed)
Initial Nutrition Assessment  DOCUMENTATION CODES:   Morbid obesity  INTERVENTION:    Vital High Protein at 75 ml/h (1800 ml per day)  Pro-stat 30 ml TID  Provides 2100 kcal, 203 gm protein, 1504 ml free water daily  NUTRITION DIAGNOSIS:   Inadequate oral intake related to inability to eat as evidenced by NPO status.  GOAL:   Provide needs based on ASPEN/SCCM guidelines  MONITOR:   Vent status, TF tolerance, Labs, I & O's, Skin  REASON FOR ASSESSMENT:   Ventilator, Consult Enteral/tube feeding initiation and management  ASSESSMENT:   71 yo male with PMH of DM-2, CHF, HLD, gout, HTN, OSA, BPH, morbid obesity, LLE transmetatarsal amputation, cardiomyopathy, CAD, pacemaker, legally blind, peripheral neuropathy, DJD, arthritis, and A fib who was admitted on 10/29 with SOB requiring intubation.  Received MD Consult for TF initiation and management. Patient is currently intubated on ventilator support MV: 8.9 L/min Temp (24hrs), Avg:98.6 F (37 C), Min:98.3 F (36.8 C), Max:98.7 F (37.1 C)   Labs reviewed. Potassium 3.3 (L) CBG's: 238-216-235 Medications reviewed and include KCl and vitamin D.  NUTRITION - FOCUSED PHYSICAL EXAM:    Most Recent Value  Orbital Region  No depletion  Upper Arm Region  No depletion  Thoracic and Lumbar Region  Unable to assess  Buccal Region  Unable to assess  Temple Region  No depletion  Clavicle Bone Region  No depletion  Clavicle and Acromion Bone Region  No depletion  Scapular Bone Region  Unable to assess  Dorsal Hand  Unable to assess  Patellar Region  No depletion  Anterior Thigh Region  No depletion  Posterior Calf Region  No depletion  Edema (RD Assessment)  Moderate  Hair  Reviewed  Eyes  Unable to assess  Mouth  Unable to assess  Skin  Reviewed  Nails  Unable to assess       Diet Order:  Diet NPO time specified  EDUCATION NEEDS:   No education needs have been identified at this time  Skin:  Skin  Assessment: Skin Integrity Issues: Skin Integrity Issues:: Incisions Incisions: L leg open wound  Last BM:  unknown  Height:   Ht Readings from Last 1 Encounters:  06/07/17 6' (1.829 m)    Weight:   Wt Readings from Last 1 Encounters:  06/14/17 (!) 337 lb 4.9 oz (153 kg)    Ideal Body Weight:  80.9 kg  BMI:  Body mass index is 45.75 kg/m.  Estimated Nutritional Needs:   Kcal:  3976-7341  Protein:  202 gm  Fluid:  2.1 L   Molli Barrows, RD, LDN, [place] Pager [phone] After Hours Pager [phone]

## 2017-06-14 NOTE — Progress Notes (Signed)
RN called RT in regards to pt desatting into the mid 80's. RT lavaged/suction pt and increased FIO2 to 70%. Will continue to monitor.

## 2017-06-14 NOTE — Progress Notes (Signed)
[facility] Progress Note Patient Name: Alexandre Faries DOB: [birth year] MRN: [id number]   Date of Service  06/14/2017  HPI/Events of Note  K+ = 3.3 and Creatinine = 2.85.  eICU Interventions  Will cautiously replace K+.      Intervention Category Major Interventions: Electrolyte abnormality - evaluation and management  Sommer,Steven Eugene 06/14/2017, 3:13 AM

## 2017-06-14 NOTE — Progress Notes (Signed)
LE venous duplex has been completed. No DVT.  Preliminary results can be found: Chart review -> CV Proc  Landry Mellow, RDMS, RVT

## 2017-06-14 NOTE — Progress Notes (Signed)
Changed RR to 14 and FIO2 to 60% per MD Dr. Nelda Marseille.

## 2017-06-15 ENCOUNTER — Inpatient Hospital Stay (HOSPITAL_COMMUNITY): Payer: Medicare (Managed Care)

## 2017-06-15 DIAGNOSIS — I5033 Acute on chronic diastolic (congestive) heart failure: Secondary | ICD-10-CM

## 2017-06-15 LAB — GLUCOSE, CAPILLARY
GLUCOSE-CAPILLARY: 217 mg/dL — AB (ref 65–99)
GLUCOSE-CAPILLARY: 155 mg/dL — AB (ref 65–99)
Glucose-Capillary: 166 mg/dL — ABNORMAL HIGH (ref 65–99)
Glucose-Capillary: 194 mg/dL — ABNORMAL HIGH (ref 65–99)
Glucose-Capillary: 189 mg/dL — ABNORMAL HIGH (ref 65–99)

## 2017-06-15 LAB — CBC
HCT: 44 % (ref 39.0–52.0)
Hemoglobin: 13.7 g/dL (ref 13.0–17.0)
MCH: 26.3 pg (ref 26.0–34.0)
MCHC: 31.1 g/dL (ref 30.0–36.0)
MCV: 84.5 fL (ref 78.0–100.0)
PLATELETS: 170 10*3/uL (ref 150–400)
RBC: 5.21 MIL/uL (ref 4.22–5.81)
RDW: 16.6 % — ABNORMAL HIGH (ref 11.5–15.5)
WBC: 14.4 10*3/uL — AB (ref 4.0–10.5)

## 2017-06-15 LAB — BASIC METABOLIC PANEL
Anion gap: 11 (ref 5–15)
BUN: 84 mg/dL — ABNORMAL HIGH (ref 6–20)
CALCIUM: 8.7 mg/dL — AB (ref 8.9–10.3)
CO2: 31 mmol/L (ref 22–32)
CREATININE: 2.43 mg/dL — AB (ref 0.61–1.24)
Chloride: 95 mmol/L — ABNORMAL LOW (ref 101–111)
GFR calc non Af Amer: 26 mL/min — ABNORMAL LOW (ref >60)
GFR, EST AFRICAN AMERICAN: 30 mL/min — AB (ref >60)
Glucose, Bld: 236 mg/dL — ABNORMAL HIGH (ref 65–99)
Potassium: 3.1 mmol/L — ABNORMAL LOW (ref 3.5–5.1)
SODIUM: 137 mmol/L (ref 135–145)

## 2017-06-15 LAB — BLOOD GAS, ARTERIAL
Acid-Base Excess: 8 mmol/L — ABNORMAL HIGH (ref 0.0–2.0)
BICARBONATE: 32.3 mmol/L — AB (ref 20.0–28.0)
Drawn by: 345601
FIO2: 100
LHR: 14 {breaths}/min
O2 Saturation: 97.7 %
PEEP: 12 cmH2O
PO2 ART: 110 mmHg — AB (ref 83.0–108.0)
Patient temperature: 98.6
VT: 620 mL
pCO2 arterial: 48.6 mmHg — ABNORMAL HIGH (ref 32.0–48.0)
pH, Arterial: 7.438 (ref 7.350–7.450)

## 2017-06-15 LAB — POCT I-STAT 3, ART BLOOD GAS (G3+)
Acid-Base Excess: 10 mmol/L — ABNORMAL HIGH (ref 0.0–2.0)
Bicarbonate: 35.4 mmol/L — ABNORMAL HIGH (ref 20.0–28.0)
O2 SAT: 96 %
PCO2 ART: 47.6 mmHg (ref 32.0–48.0)
TCO2: 37 mmol/L — AB (ref 22–32)
pH, Arterial: 7.479 — ABNORMAL HIGH (ref 7.350–7.450)
pO2, Arterial: 78 mmHg — ABNORMAL LOW (ref 83.0–108.0)

## 2017-06-15 LAB — PROTIME-INR
INR: 1.2
PROTHROMBIN TIME: 15.1 s (ref 11.4–15.2)

## 2017-06-15 LAB — MAGNESIUM: MAGNESIUM: 2.3 mg/dL (ref 1.7–2.4)

## 2017-06-15 LAB — PHOSPHORUS: Phosphorus: 4.2 mg/dL (ref 2.5–4.6)

## 2017-06-15 LAB — APTT: aPTT: 31 seconds (ref 24–36)

## 2017-06-15 MED ORDER — FUROSEMIDE 10 MG/ML IJ SOLN: 80.0000 mg | Freq: Two times a day (BID) | INTRAMUSCULAR | Status: DC

## 2017-06-15 MED ORDER — MIDAZOLAM HCL 2 MG/2ML IJ SOLN
INTRAMUSCULAR | Status: AC
  Administered 2017-06-15: 4 mg
  Filled 2017-06-15: qty 4

## 2017-06-15 MED ORDER — INSULIN GLARGINE 100 UNIT/ML ~~LOC~~ SOLN
60.0000 [IU] | Freq: Every day | SUBCUTANEOUS | Status: DC
  Administered 2017-06-15 – 2017-06-18 (×4): 60 [IU] via SUBCUTANEOUS
  Filled 2017-06-15 (×4): qty 0.6

## 2017-06-15 MED ORDER — POTASSIUM CHLORIDE 10 MEQ/100ML IV SOLN
10.0000 meq | INTRAVENOUS | Status: AC
  Administered 2017-06-15 (×2): 10 meq via INTRAVENOUS
  Filled 2017-06-15 (×2): qty 100

## 2017-06-15 MED ORDER — POTASSIUM CHLORIDE 20 MEQ/15ML (10%) PO SOLN
40.0000 meq | Freq: Three times a day (TID) | ORAL | Status: AC
  Administered 2017-06-15 (×2): 40 meq
  Filled 2017-06-15 (×3): qty 30

## 2017-06-15 MED ORDER — POTASSIUM CHLORIDE 20 MEQ PO PACK
20.0000 meq | PACK | Freq: Once | ORAL | Status: AC
  Administered 2017-06-15: 20 meq
  Filled 2017-06-15: qty 1

## 2017-06-15 MED ORDER — FUROSEMIDE 10 MG/ML IJ SOLN
8.0000 mg/h | INTRAVENOUS | Status: AC
  Administered 2017-06-15 – 2017-06-16 (×2): 8 mg/h via INTRAVENOUS
  Filled 2017-06-15: qty 25
  Filled 2017-06-15 (×2): qty 21

## 2017-06-15 MED ORDER — ETOMIDATE 2 MG/ML IV SOLN
20.0000 mg | Freq: Once | INTRAVENOUS | Status: AC
  Administered 2017-06-15: 20 mg via INTRAVENOUS

## 2017-06-15 MED ORDER — METOLAZONE 5 MG PO TABS
5.0000 mg | ORAL_TABLET | Freq: Every day | ORAL | Status: AC
  Administered 2017-06-15: 5 mg via ORAL
  Filled 2017-06-15: qty 1

## 2017-06-15 NOTE — Progress Notes (Signed)
Advanced Heart Failure Rounding Note  Primary Cardiologist: Dr. Aundra Dubin   Subjective:    Admitted with acute respiratory failure requiring intubation. WBC trending down 18>15>14.4  Intubated and sedated. Tube replaced/repositioned overnight as he had moved it out of position with talking over tube.   Negative 1.2 L but up 2 lb. Creatinine 2.55 -> 2.43. K 3.1  Echo 06/14/17 LVEF 60-65%, Moderate AS  Objective:   Weight Range: (!) 338 lb 13.6 oz (153.7 kg) Body mass index is 45.96 kg/m.   Vital Signs:   Temp:  [98.3 F (36.8 C)-99.3 F (37.4 C)] 99 F (37.2 C) (10/31 0723) Pulse Rate:  [51-65] 51 (10/31 0745) Resp:  [14-22] 16 (10/31 0745) BP: (125-154)/(64-84) 143/75 (10/31 0745) SpO2:  [86 %-99 %] 98 % (10/31 0745) FiO2 (%):  [60 %-100 %] 100 % (10/31 0745) Weight:  [338 lb 13.6 oz (153.7 kg)] 338 lb 13.6 oz (153.7 kg) (10/31 0413)   Weight change: Filed Weights   06/13/17 1800 06/14/17 0500 06/15/17 0413  Weight: (!) 343 lb 7.6 oz (155.8 kg) (!) 337 lb 4.9 oz (153 kg) (!) 338 lb 13.6 oz (153.7 kg)   Intake/Output:   Intake/Output Summary (Last 24 hours) at 06/15/17 0828 Last data filed at 06/15/17 0600  Gross per 24 hour  Intake           826.62 ml  Output             1915 ml  Net         -1088.38 ml    Physical Exam    General: Intubated/sedated.  HEENT: + ETT Neck: Supple. JVP difficult due to body habitus. Carotids 2+ bilat; no bruits. No thyromegaly or nodule noted. Cor: PMI nondisplaced. Regular, brady, No M/G/R noted Lungs: Coarse and diminished. + Mechanical breathing sounds.  Abdomen: Obese, soft, non-tender, non-distended, no HSM. No bruits or masses. +BS  Extremities: No cyanosis or clubbing. R and LLE SCDs. LLE partial thickness wound with erythema superiorly.  Neuro: Intubated/sedated  Telemetry   A-paced 50-60s, Personally reviewed.   EKG    N/A  Labs    CBC  Recent Labs  06/13/17 0806 06/14/17 0145 06/15/17 0312  WBC  18.2* 15.1* 14.4*  NEUTROABS 14.9*  --   --   HGB 14.3 12.8* 13.7  HCT 46.3 40.6 44.0  MCV 85.7 82.9 84.5  PLT 192 166 321   Basic Metabolic Panel  Recent Labs  06/14/17 1506 06/15/17 0312  NA 137 137  K 3.4* 3.1*  CL 93* 95*  CO2 31 31  GLUCOSE 152* 236*  BUN 82* 84*  CREATININE 2.55* 2.43*  CALCIUM 8.8* 8.7*  MG  --  2.3  PHOS  --  4.2   Liver Function Tests  Recent Labs  06/13/17 0806 06/14/17 0145  AST 78* 47*  ALT 97* 84*  ALKPHOS 104 98  BILITOT 1.8* 1.1  PROT 8.1 7.1  ALBUMIN 3.2* 2.5*   No results for input(s): LIPASE, AMYLASE in the last 72 hours. Cardiac Enzymes  Recent Labs  06/13/17 1504 06/13/17 1923 06/14/17 0145  TROPONINI <0.03 <0.03 <0.03    BNP: BNP (last 3 results)  Recent Labs  02/09/17 1155 03/23/17 1113 06/13/17 0805  BNP 47.5 78.0 158.5*    ProBNP (last 3 results) No results for input(s): PROBNP in the last 8760 hours.   D-Dimer No results for input(s): DDIMER in the last 72 hours. Hemoglobin A1C No results for input(s): HGBA1C in the last  72 hours. Fasting Lipid Panel  Recent Labs  06/13/17 1504  TRIG 78   Thyroid Function Tests No results for input(s): TSH, T4TOTAL, T3FREE, THYROIDAB in the last 72 hours.  Invalid input(s): FREET3  Other results:   Imaging    Dg Chest Port 1 View  Result Date: 06/15/2017 CLINICAL DATA:  71 year old male with endotracheal and enteric tube placement. EXAM: PORTABLE CHEST 1 VIEW COMPARISON:  Chest radiograph dated 06/14/2017 FINDINGS: Endotracheal tube in stable position above the carina. Enteric tube extends into the left hemiabdomen with side-port at the level of the GE junction. The tip of the tube is beyond the inferior margin of the image. The tube can be further advanced into the stomach and positioning of the tip better assessed on an abdominal radiograph. There is again cardiomegaly with vascular congestion and a small right pleural effusion. The left costophrenic  angle has been excluded from the image. Patchy area of airspace density in the mid to lower lung field on the right may represent edema or pneumonia. Overall there is progression of pulmonary airspace density compared to the earlier radiograph. No pneumothorax. There is atherosclerotic calcification of the aortic arch. Right pectoral pacemaker device. IMPRESSION: 1. Cardiomegaly with vascular congestion and pulmonary edema. Overall interval progression of CHF compared to the prior study. An area of airspace opacity involving the right mid to lower lung field may represent changes of CHF although pneumonia is not excluded. Clinical correlation is recommended. 2. Stable positioning of the endotracheal and enteric tube. Electronically Signed   By: Anner Crete M.D.   On: 06/15/2017 03:02     Medications:     Scheduled Medications: . amiodarone  200 mg Per Tube Daily  . amLODipine  10 mg Oral Daily  . aspirin  325 mg Per Tube Daily  . atorvastatin  40 mg Per Tube Daily  . cephALEXin  500 mg Per Tube Q8H  . chlorhexidine gluconate (MEDLINE KIT)  15 mL Mouth Rinse BID  . cholecalciferol  1,000 Units Oral Daily  . citalopram  20 mg Per Tube Daily  . famotidine  20 mg Per Tube Daily  . feeding supplement (PRO-STAT SUGAR FREE 64)  30 mL Per Tube TID  . feeding supplement (VITAL HIGH PROTEIN)  1,000 mL Per Tube Q24H  . heparin  5,000 Units Subcutaneous Q8H  . hydrALAZINE  25 mg Per Tube TID  . insulin aspart  0-20 Units Subcutaneous Q4H  . insulin glargine  40 Units Subcutaneous Daily  . mouth rinse  15 mL Mouth Rinse QID  . rocuronium  100 mg Intravenous Once    Infusions: . sodium chloride 10 mL/hr (06/15/17 0122)  . dexmedetomidine (PRECEDEX) IV infusion 0.8 mcg/kg/hr (06/15/17 0806)    PRN Medications: acetaminophen, fentaNYL (SUBLIMAZE) injection, fentaNYL (SUBLIMAZE) injection, midazolam, ondansetron (ZOFRAN) IV, senna-docusate    Patient Profile   [name] is a 71 year old  with history of chronic diastolic heart failure complicated by RV failure, CKD Stage III, symptomatic bradycardia, S/P PPM, ICH 2015, PE 2015, OSA, DM  CAD nonobstructive LHC 2006, morbid obesity, and blindness.   Admitted with acute respiratory failure.  Intubated in the ED.   Assessment/Plan   1. Acute Hypoxic Respiratory Failure  - Initially on Bipap but later intubated. - Remains intubated and sedated.  2. Chronic Diastolic Heart Failure  - ECHO 03/2017 EF 60-65% Grade II DD, moderate aortic stenosis. BNP is higher than his baseline but could be R heart strain in the setting  of respiratory distress.   - Volume status trending up. IV fluids stopped yesterday. Will give 80 mg IV lasix BID today and follow response.  - Have spoken to primary team about Central access for CVP.  - Repeat ECHO with normal LVEF.  3. AKI on CKD Stage III- Creatinine baseline 2.3-2.6  - Creatinine on admit 3.3. Received IV fluids  - Creatinine trending down 3.3>2.85>2.4 in setting of fluid overload. Continue to follow closely.  4. H/O PE 2015: Venous dopplers negative this admission.  5. ID: WBC  - Improving. ABX per primary.  6. H/O Symptomatic Bradycardia- PPM 7. H/O ICH 2015   8. LLE Cellulitis- discussed with CCM. WBC 14.4. - Continue ABX (cephalexin).  9. HTN;  - Continue current meds.   10. Atrial fibrillation: Paroxysmal.  Not anticoagulated given history of intracerebral hemorrhage.  - Sinus brady on tele.  11. Aortic stenosis:  - Moderate on Echo 06/14/17.   Length of Stay: 2  [name]  06/15/2017, 8:28 AM  Advanced Heart Failure Team Pager [phone] (M-F; 7a - 4p)  Please contact [facility] for night-coverage after hours (4p -7a ) and weekends on [email or web address]  Patient seen with PA, agree with the above note.  Exam very difficult for volume status.  He was on high dose diuretics at home.  At admission, thought to be possibly dehydrated with viral syndrome, but CXR  looks like pulmonary edema to me.  Creatinine is down around his baseline at this point.  - I think he needs to be diuresed now, start Lasix 80 mg IV bid and follow response closely.  - Probably would be helpful in management here with baseline renal dysfunction and difficult exam for volume to place central access so we can follow CVP.    Echo reviewed, stable from past with normal EF, diastolic dysfunction, and moderate aortic stenosis.   [name] 06/15/2017 8:47 AM

## 2017-06-15 NOTE — Procedures (Signed)
Central Venous Catheter Insertion Procedure Note Maven Varelas [id number] 1946/05/26  Procedure: Insertion of Central Venous Catheter Indications: Assessment of intravascular volume, Drug and/or fluid administration and Frequent blood sampling  Procedure Details Consent: Risks of procedure as well as the alternatives and risks of each were explained to the (patient/caregiver).  Consent for procedure obtained. Time Out: Verified patient identification, verified procedure, site/side was marked, verified correct patient position, special equipment/implants available, medications/allergies/relevent history reviewed, required imaging and test results available.  Performed  Maximum sterile technique was used including antiseptics, cap, gloves, gown, hand hygiene, mask and sheet. Skin prep: Chlorhexidine; local anesthetic administered A antimicrobial bonded/coated triple lumen catheter was placed in the left internal jugular vein using the Seldinger technique.  Evaluation Blood flow good Complications: No apparent complications Patient did tolerate procedure well. Chest X-ray ordered to verify placement.  CXR: pending.  Procedure performed under direct ultrasound guidance for real time vessel cannulation.     Montey Hora, [facility] Pager: [phone]  or [phone] 06/15/2017, 1:49 PM  Rush Farmer, M.D. [facility]. Pager: [phone]. After hours pager: [phone].

## 2017-06-15 NOTE — Progress Notes (Signed)
eLink Physician-Brief Progress Note Patient Name: [name] DOB: 20-Sep-1945 MRN: [id number]   Date of Service  06/15/2017  HPI/Events of Note  Bedside nurse reports patient is having bleeding from around left internal jugular central venous catheter insertion site. Catheter placed today. Postprocedure portable chest x-ray reviewed showing good positioning of catheter. Medications reviewed showing full dose aspirin & subcutaneous heparin. Platelets normal & patient with some uremia in the setting of acute renal failure. No recent coag's. Hepatic function panel from 10/30 with seemingly normal LFTs and hypoalbuminemia.   eICU Interventions  1. Continuing pressure to catheter site 2. Monitoring for further bleeding 3. Repeat CBC with a.m. labs 4. Checking stat coags      Intervention Category Major Interventions: Other:  Tera Partridge 06/15/2017, 9:44 PM

## 2017-06-15 NOTE — Procedures (Signed)
Endotracheal Intubation Procedure Note  Indication for endotracheal intubation: impending airway compromise, impending respiratory failure and potential airway compromise. Airway Assessment: Mallampati Class: III (soft and hard palate and base of uvula visible). Sedation: 20mg  etomidate and  4 mg versed. Paralytic: none. Lidocaine: yes. Atropine: no. Equipment: Macintosh 4 laryngoscope blade and 7.23mm cuffed endotracheal tube. Cricoid Pressure: no. Number of attempts: 2. Bougie: yes ETT location confirmed by by auscultation, by CXR and ETCO2 monitor   .  [name] 06/15/2017

## 2017-06-15 NOTE — Progress Notes (Signed)
PULMONARY / CRITICAL CARE MEDICINE   Name: [name] MRN: [id number] DOB: [birth year]    ADMISSION DATE:  06/13/2017 CONSULTATION DATE:  06/13/17  REFERRING MD:  Dr. Vanita Panda / EDP   CHIEF COMPLAINT:  Shortness of Breath   HISTORY OF PRESENT ILLNESS:  71 y/o M who presented to [facility] on 10/29 with reports of acute shortness of breath.    The patient is on BiPAP.  Information obtained from patient (as able), chart review and family at bedside.  The patient lives at home with the assistance of home health aides.  They are in/out of his home frequently during the day.  They assist him with all aspects of care.  In addition, [facility].  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 Family reports that some of the aides are good about keeping him to a low sodium diet, others are not.  They are unsure if he actually follows his fluid restriction.  Daughter thinks he was recently taken off a "blood thinner" but she is unsure (none listed on home medication list).  He does carry a hx of PE and prior anticoagulation. Family (sister, daughter and brother in law deny knowledge of him ever smoking). They state he has been on the vent before but are unsure if he would want it again.   He was recently seen in the ER on 10/23 for dizziness and vomiting that resolved with meclizine and zofran. The patient denies known sick contacts. EMS was called for acute shortness of breath.  He was placed on CPAP and transported to the ER.  In the ER, he was found to be hypoxic requiring BiPAP 16/8 with 100% FiO2 (he dropped to the 80's on 80% O2 per RT).  Initial CXR shows concern for pulmonary edema with pleural effusion.  Initial labs- Na 135, K 5.1, CL 90, CO2 30, BUN 83, CR 3.30 (up from 2.49 on 10/23, baseline 2.1-2.6 in the last year), glucose 166, AST 78 / ALT 97, T.Bili 1.8, Troponin <0.03, WBC 18.2, Hgb 14.3 and platelets 192.  He is unable to describe further symptoms  due to BiPAP mask.   He is followed by the CHF clinic and was most recently seen on 03/23/17 by Oda Kilts, PA-C.  At that time his office weight was 339 lbs.  In 12/2016 he was 329lbs.  On 10/23 in the ER he was 340 lbs.      SUBJECTIVE: Had cuff leak overnight, required re-intubation.  Was on high PEEP and FiO2 (12 and 100%), just dropped to 10 and 80%, will continue to wean as able. CHF team asking for CVL to monitor CVP's.  VITAL SIGNS: BP (!) 143/75   Pulse (!) 51   Temp 99 F (37.2 C) (Oral)   Resp 16   Wt (!) 153.7 kg (338 lb 13.6 oz)   SpO2 98%   BMI 45.96 kg/m   HEMODYNAMICS:    VENTILATOR SETTINGS: Vent Mode: PRVC FiO2 (%):  [60 %-100 %] 100 % Set Rate:  [14 bmp-18 bmp] 14 bmp Vt Set:  [620 mL] 620 mL PEEP:  [12 cmH20] 12 cmH20 Plateau Pressure:  [25 cmH20-27 cmH20] 26 cmH20  INTAKE / OUTPUT: I/O last 3 completed shifts: In: 2286.9 [I.V.:1986.9; IV Piggyback:300] Out: 6144 [Urine:3540]  PHYSICAL EXAMINATION: General: chronically ill appearing male, in NAD HEENT: MM pink/dry, ETT in place Neuro: Sedated, does not follow commands CV: s1s2 rrr,  no m/r/g, diminished tones  PULM: even/non-labored, faint crackles bases otherwise clear NW:GNFA, non-tender, bsx4 active  Extremities: warm/dry, 1+ pitting BLE edema, compression stockings in place, LLE transmetatarsal amputation well healed.  LLE warm with erythema. Skin: Warm, dry.  LLE as above.  LABS:  BMET  Recent Labs Lab 06/14/17 0145 06/14/17 1506 06/15/17 0312  NA 136 137 137  K 3.3* 3.4* 3.1*  CL 92* 93* 95*  CO2 30 31 31   BUN 83* 82* 84*  CREATININE 2.85* 2.55* 2.43*  GLUCOSE 211* 152* 236*    Electrolytes  Recent Labs Lab 06/14/17 0145 06/14/17 1506 06/15/17 0312  CALCIUM 8.7* 8.8* 8.7*  MG  --   --  2.3  PHOS  --   --  4.2    CBC  Recent Labs Lab 06/13/17 0806 06/14/17 0145 06/15/17 0312  WBC 18.2* 15.1* 14.4*  HGB 14.3 12.8* 13.7  HCT 46.3 40.6 44.0  PLT 192 166 170     Coag's No results for input(s): APTT, INR in the last 168 hours.  Sepsis Markers No results for input(s): LATICACIDVEN, PROCALCITON, O2SATVEN in the last 168 hours.  ABG  Recent Labs Lab 06/13/17 1620 06/14/17 1155 06/15/17 0413  PHART 7.468* 7.543* 7.438  PCO2ART 47.8 38.8 48.6*  PO2ART 63.0* 88.0 110*    Liver Enzymes  Recent Labs Lab 06/13/17 0806 06/14/17 0145  AST 78* 47*  ALT 97* 84*  ALKPHOS 104 98  BILITOT 1.8* 1.1  ALBUMIN 3.2* 2.5*    Cardiac Enzymes  Recent Labs Lab 06/13/17 1504 06/13/17 1923 06/14/17 0145  TROPONINI <0.03 <0.03 <0.03    Glucose  Recent Labs Lab 06/14/17 1139 06/14/17 1526 06/14/17 2012 06/14/17 2341 06/15/17 0335 06/15/17 0726  GLUCAP 181* 150* 171* 219* 217* 166*    Imaging Dg Chest Port 1 View  Result Date: 06/15/2017 CLINICAL DATA:  71 year old male with endotracheal and enteric tube placement. EXAM: PORTABLE CHEST 1 VIEW COMPARISON:  Chest radiograph dated 06/14/2017 FINDINGS: Endotracheal tube in stable position above the carina. Enteric tube extends into the left hemiabdomen with side-port at the level of the GE junction. The tip of the tube is beyond the inferior margin of the image. The tube can be further advanced into the stomach and positioning of the tip better assessed on an abdominal radiograph. There is again cardiomegaly with vascular congestion and a small right pleural effusion. The left costophrenic angle has been excluded from the image. Patchy area of airspace density in the mid to lower lung field on the right may represent edema or pneumonia. Overall there is progression of pulmonary airspace density compared to the earlier radiograph. No pneumothorax. There is atherosclerotic calcification of the aortic arch. Right pectoral pacemaker device. IMPRESSION: 1. Cardiomegaly with vascular congestion and pulmonary edema. Overall interval progression of CHF compared to the prior study. An area of airspace  opacity involving the right mid to lower lung field may represent changes of CHF although pneumonia is not excluded. Clinical correlation is recommended. 2. Stable positioning of the endotracheal and enteric tube. Electronically Signed   By: Anner Crete M.D.   On: 06/15/2017 03:02     STUDIES:  ECHO 10/29 >> EF 60-65%, IVC dilated. BLE Venous Duplex 10/29 >> neg  CULTURES: RVP 10/29 >> neg  ANTIBIOTICS: Keflex 10/30 >   SIGNIFICANT EVENTS: 10/29  Admit with acute SOB  LINES/TUBES: ETT 10/29 > 10/31 >.  10/31 >  CVL 10/31 >   DISCUSSION: 72 y/o M with a complex medical  history admitted 10/29 with acute onset SOB.  Suspect decompensated diastolic CHF but viral illness, PE also on differential (given high O2 needs).    ASSESSMENT / PLAN:  PULMONARY A: Acute Hypoxic Respiratory Failure - in setting of AKI, decompensated CHF, r/o PNA, viral illness, PE OSA - baseline CPAP 12cm H2O Hx PE in 2015 - not on anticoagulation due to rectal bleeding P:   Continue PRVC for now, will hold on wean due to high FiO2 and PEEP Received empiric abx in ER for possible CAP, will hold further for now Defer diuretic regimen to Cardiology  Follow ABG / CXR Will need CPAP QHS post extubation   CARDIOVASCULAR A:  Diastolic CHF with concern for Acute Decompensation Hx HTN, HLD, CAD, PAF (not on anticoagulation due to rectal bleeding), bradycardia s/p pacemaker  P:  CHF Service consulted, appreciate input  Will defer diuretics to CHF team   Will place CVL to assess CVP's and help guide diuresis Continue preadmission amio, amlodipine, aspirin, atorvastatin, hydralazine.  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  P:   NPO  Continue  TF's  HEMATOLOGIC A:   DVT prophylaxis Hx of PE - LE duplex from 10/30 neg P:  SCD on right leg (no left leg given cellulitis) Heparin subcutaneous Trend CBC   INFECTIOUS A:   Possible LLE cellulitis Rule Out Viral Illness, PNA - RVP neg 10/29 P:   Continue keflex (started 10/30)  ENDOCRINE A:   DM - with CKD, resultant blindness    P:   Hold home oral agents SSI Q4 with resistant scale  NEUROLOGIC A:   Acute Metabolic Encephalopathy  P:   RASS goal: 0 to -1  Sedation: Precedex gtt / fentanyl PRN Minimize sedating medications as able  Daily SBT / WUA   FAMILY  Updates: Daughter, sister and brother-in-law updated at bedside 10/29.  No family at bedside 10/31.  Inter-disciplinary family meet or Palliative Care meeting due by:  11/5.  CC time: 30 min.  Montey Hora, [facility] Pager: [phone]  or [phone] 06/15/2017, 8:43 AM  Attending Note:  71 year old male with CHF history who did not take his lasix, developed acute pulmonary edema and respiratory failure.  Overnight, the patient was reintubated due to a cuff leak.  On exam, diffuse crackles.  I reviewed CXR myself, pulmonary edema noted and ETT ok.  Will d/c lasix and start a drip.  Zaroxolyn PO 5 mgx1.  Replace K aggressively.  Titrate O2 and PEEP throughout the day with goal of 40% and 5 today.  Hold off weaning today.  The patient is critically ill with multiple organ systems failure and requires high complexity decision making for assessment and support, frequent evaluation and titration of therapies, application of advanced monitoring technologies and extensive interpretation of multiple databases.   Critical Care Time devoted to patient care services described in this note is  35  Minutes. This time reflects time of care of this signee Dr Jennet Maduro. This critical care time does not reflect procedure time, or teaching time or supervisory time of  PA/NP/Med student/Med Resident etc but could involve care discussion time.  Rush Farmer, M.D.  [place]. Pager: [phone]. After hours pager: [phone].

## 2017-06-16 ENCOUNTER — Inpatient Hospital Stay (HOSPITAL_COMMUNITY): Payer: Medicare (Managed Care)

## 2017-06-16 LAB — BLOOD GAS, ARTERIAL
ACID-BASE EXCESS: 7.8 mmol/L — AB (ref 0.0–2.0)
BICARBONATE: 31.9 mmol/L — AB (ref 20.0–28.0)
DRAWN BY: 236041
FIO2: 60
LHR: 14 {breaths}/min
MECHVT: 620 mL
O2 SAT: 94.8 %
PEEP/CPAP: 10 cmH2O
PH ART: 7.454 — AB (ref 7.350–7.450)
Patient temperature: 99.2
pCO2 arterial: 46.4 mmHg (ref 32.0–48.0)
pO2, Arterial: 76.8 mmHg — ABNORMAL LOW (ref 83.0–108.0)

## 2017-06-16 LAB — BASIC METABOLIC PANEL
Anion gap: 10 (ref 5–15)
BUN: 86 mg/dL — ABNORMAL HIGH (ref 6–20)
CALCIUM: 8.7 mg/dL — AB (ref 8.9–10.3)
CO2: 31 mmol/L (ref 22–32)
CREATININE: 2.19 mg/dL — AB (ref 0.61–1.24)
Chloride: 99 mmol/L — ABNORMAL LOW (ref 101–111)
GFR calc Af Amer: 34 mL/min — ABNORMAL LOW (ref >60)
GFR calc non Af Amer: 29 mL/min — ABNORMAL LOW (ref >60)
GLUCOSE: 230 mg/dL — AB (ref 65–99)
Potassium: 3.6 mmol/L (ref 3.5–5.1)
Sodium: 140 mmol/L (ref 135–145)

## 2017-06-16 LAB — GLUCOSE, CAPILLARY
GLUCOSE-CAPILLARY: 126 mg/dL — AB (ref 65–99)
GLUCOSE-CAPILLARY: 172 mg/dL — AB (ref 65–99)
GLUCOSE-CAPILLARY: 213 mg/dL — AB (ref 65–99)
GLUCOSE-CAPILLARY: 251 mg/dL — AB (ref 65–99)
Glucose-Capillary: 114 mg/dL — ABNORMAL HIGH (ref 65–99)
Glucose-Capillary: 162 mg/dL — ABNORMAL HIGH (ref 65–99)
Glucose-Capillary: 169 mg/dL — ABNORMAL HIGH (ref 65–99)

## 2017-06-16 LAB — CBC
HEMATOCRIT: 41.9 % (ref 39.0–52.0)
Hemoglobin: 12.8 g/dL — ABNORMAL LOW (ref 13.0–17.0)
MCH: 26 pg (ref 26.0–34.0)
MCHC: 30.5 g/dL (ref 30.0–36.0)
MCV: 85.2 fL (ref 78.0–100.0)
Platelets: 162 10*3/uL (ref 150–400)
RBC: 4.92 MIL/uL (ref 4.22–5.81)
RDW: 16.7 % — AB (ref 11.5–15.5)
WBC: 13.7 10*3/uL — ABNORMAL HIGH (ref 4.0–10.5)

## 2017-06-16 LAB — MAGNESIUM: MAGNESIUM: 2 mg/dL (ref 1.7–2.4)

## 2017-06-16 LAB — PHOSPHORUS: Phosphorus: 3.4 mg/dL (ref 2.5–4.6)

## 2017-06-16 MED ORDER — POTASSIUM CHLORIDE 10 MEQ/50ML IV SOLN
10.0000 meq | INTRAVENOUS | Status: AC
  Administered 2017-06-16 (×2): 10 meq via INTRAVENOUS
  Filled 2017-06-16 (×2): qty 50

## 2017-06-16 MED ORDER — FENTANYL CITRATE (PF) 100 MCG/2ML IJ SOLN
12.5000 ug | INTRAMUSCULAR | Status: DC | PRN
  Administered 2017-06-16 – 2017-06-17 (×4): 12.5 ug via INTRAVENOUS
  Filled 2017-06-16 (×4): qty 2

## 2017-06-16 MED ORDER — POTASSIUM CHLORIDE 20 MEQ/15ML (10%) PO SOLN
20.0000 meq | Freq: Three times a day (TID) | ORAL | Status: AC
  Administered 2017-06-16: 20 meq
  Filled 2017-06-16: qty 15

## 2017-06-16 MED ORDER — ORAL CARE MOUTH RINSE
15.0000 mL | Freq: Two times a day (BID) | OROMUCOSAL | Status: DC
  Administered 2017-06-16 – 2017-06-24 (×13): 15 mL via OROMUCOSAL

## 2017-06-16 MED ORDER — POTASSIUM CHLORIDE 20 MEQ PO PACK
20.0000 meq | PACK | Freq: Two times a day (BID) | ORAL | Status: DC
  Administered 2017-06-16 – 2017-06-17 (×3): 20 meq
  Filled 2017-06-16 (×8): qty 1

## 2017-06-16 MED ORDER — METOLAZONE 2.5 MG PO TABS
2.5000 mg | ORAL_TABLET | Freq: Once | ORAL | Status: DC
  Filled 2017-06-16: qty 1

## 2017-06-16 NOTE — Progress Notes (Signed)
PULMONARY / CRITICAL CARE MEDICINE   Name: [name] MRN: [id number] DOB: November 11, 1945    ADMISSION DATE:  06/13/2017 CONSULTATION DATE:  06/13/17  REFERRING MD:  Dr. Vanita Panda / EDP   CHIEF COMPLAINT:  Shortness of Breath   HISTORY OF PRESENT ILLNESS:  71 y/o M who presented to [facility] on 10/29 with reports of acute shortness of breath.    The patient is on BiPAP.  Information obtained from patient (as able), chart review and family at bedside.  The patient lives at home with the assistance of home health aides.  They are in/out of his home frequently during the day.  They assist him with all aspects of care.  In addition, [facility] per [facility].  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 Family reports that some of the aides are good about keeping him to a low sodium diet, others are not.  They are unsure if he actually follows his fluid restriction.  Daughter thinks he was recently taken off a "blood thinner" but she is unsure (none listed on home medication list).  He does carry a hx of PE and prior anticoagulation. Family (sister, daughter and brother in law deny knowledge of him ever smoking). They state he has been on the vent before but are unsure if he would want it again.   He was recently seen in the ER on 10/23 for dizziness and vomiting that resolved with meclizine and zofran. The patient denies known sick contacts. EMS was called for acute shortness of breath.  He was placed on CPAP and transported to the ER.  In the ER, he was found to be hypoxic requiring BiPAP 16/8 with 100% FiO2 (he dropped to the 80's on 80% O2 per RT).  Initial CXR shows concern for pulmonary edema with pleural effusion.  Initial labs- Na 135, K 5.1, CL 90, CO2 30, BUN 83, CR 3.30 (up from 2.49 on 10/23, baseline 2.1-2.6 in the last year), glucose 166, AST 78 / ALT 97, T.Bili 1.8, Troponin <0.03, WBC 18.2, Hgb 14.3 and platelets 192.  He is unable to describe further symptoms  due to BiPAP mask.   He is followed by the CHF clinic and was most recently seen on 03/23/17 by Oda Kilts, PA-C.  At that time his office weight was 339 lbs.  In 12/2016 he was 329lbs.  On 10/23 in the ER he was 340 lbs.      SUBJECTIVE: CVL placed 10/31, awaiting CVP. Bleeding from CVL site overnight, stable this AM with no further bleeding. - 1.7 L net since admit (4.1L UOP past 24 hours).  VITAL SIGNS: BP 137/76   Pulse (!) 52   Temp 98.3 F (36.8 C) (Oral)   Resp 17   Wt (!) 150.9 kg (332 lb 10.8 oz)   SpO2 95%   BMI 45.12 kg/m   HEMODYNAMICS:    VENTILATOR SETTINGS: Vent Mode: PRVC FiO2 (%):  [60 %-100 %] 60 % Set Rate:  [14 bmp] 14 bmp Vt Set:  [620 mL] 620 mL PEEP:  [10 cmH20-12 cmH20] 10 cmH20 Plateau Pressure:  [21 cmH20-26 cmH20] 22 cmH20  INTAKE / OUTPUT: I/O last 3 completed shifts: In: 4343.5 [I.V.:1613.5; NG/GT:2530; IV Piggyback:200] Out: 9323 [Urine:5150]  PHYSICAL EXAMINATION: General: chronically ill appearing male, in NAD HEENT: MM pink/dry, ETT in place Neuro: Opens eyes, follows some basic commands CV: s1s2 rrr, no m/r/g, diminished tones  PULM: even/non-labored,  faint crackles bases otherwise clear YP:PJKD, non-tender, bsx4 active  Extremities: warm/dry, 1+ pitting BLE edema, compression stockings in place, LLE transmetatarsal amputation well healed LLE warm with erythema Skin: Warm, dry.  LLE as above  LABS:  BMET  Recent Labs Lab 06/14/17 1506 06/15/17 0312 06/16/17 0328  NA 137 137 140  K 3.4* 3.1* 3.6  CL 93* 95* 99*  CO2 31 31 31   BUN 82* 84* 86*  CREATININE 2.55* 2.43* 2.19*  GLUCOSE 152* 236* 230*    Electrolytes  Recent Labs Lab 06/14/17 1506 06/15/17 0312 06/16/17 0328  CALCIUM 8.8* 8.7* 8.7*  MG  --  2.3 2.0  PHOS  --  4.2 3.4    CBC  Recent Labs Lab 06/14/17 0145 06/15/17 0312 06/16/17 0328  WBC 15.1* 14.4* 13.7*  HGB 12.8* 13.7 12.8*  HCT 40.6 44.0 41.9  PLT 166 170 162    Coag's  Recent  Labs Lab 06/15/17 2207  APTT 31  INR 1.20    Sepsis Markers No results for input(s): LATICACIDVEN, PROCALCITON, O2SATVEN in the last 168 hours.  ABG  Recent Labs Lab 06/15/17 0413 06/15/17 0937 06/16/17 0415  PHART 7.438 7.479* 7.454*  PCO2ART 48.6* 47.6 46.4  PO2ART 110* 78.0* 76.8*    Liver Enzymes  Recent Labs Lab 06/13/17 0806 06/14/17 0145  AST 78* 47*  ALT 97* 84*  ALKPHOS 104 98  BILITOT 1.8* 1.1  ALBUMIN 3.2* 2.5*    Cardiac Enzymes  Recent Labs Lab 06/13/17 1504 06/13/17 1923 06/14/17 0145  TROPONINI <0.03 <0.03 <0.03    Glucose  Recent Labs Lab 06/15/17 1114 06/15/17 1538 06/15/17 1951 06/16/17 0037 06/16/17 0430 06/16/17 0729  GLUCAP 194* 155* 189* 251* 213* 172*    Imaging Dg Chest Port 1 View  Result Date: 06/15/2017 CLINICAL DATA:  Status post central line placement EXAM: PORTABLE CHEST 1 VIEW COMPARISON:  Portable chest x-ray of earlier today FINDINGS: There has been placement of a left internal jugular venous catheter. The tip projects over the proximal SVC at the junction of the right and left brachiocephalic veins. There is no postprocedure pneumothorax. The cardiac silhouette remains enlarged. The pulmonary vascularity remains engorged and indistinct. There are probable bilateral pleural effusions layering posteriorly. The endotracheal tube tip lies 5.8 cm above the carina. The esophagogastric tube tip projects below the inferior margin of the image. The ICD is in stable position. IMPRESSION: No postprocedure complication following placement of the left internal jugular venous catheter. Electronically Signed   By: David  Martinique M.D.   On: 06/15/2017 14:01    STUDIES:  ECHO 10/29 >> EF 60-65%, IVC dilated. BLE Venous Duplex 10/29 >> neg PCXR 11/1 > edema  CULTURES: RVP 10/29 >> neg  ANTIBIOTICS: Keflex 10/30 >   SIGNIFICANT EVENTS: 10/29  Admit with acute SOB  LINES/TUBES: ETT 10/29 > 10/31 >.  10/31 >  CVL 10/31 >    DISCUSSION: 71 y/o M with a complex medical history admitted 10/29 with acute onset SOB.  Suspect decompensated diastolic CHF but viral illness, PE also on differential (given high O2 needs).    ASSESSMENT / PLAN:  PULMONARY A: Acute Hypoxic Respiratory Failure - in setting of AKI, decompensated CHF, r/o PNA, viral illness, PE OSA - baseline CPAP 12cm H2O Hx PE in 2015 - not on anticoagulation due to rectal bleeding P:   Continue PRVC for now Dropped FiO2 from 60 to 40, PEEP from 10 to 8 Continue to wean, SBT if able Received empiric abx in ER  for possible CAP, will hold further for now Continue lasix gtt, goal neg balance Follow ABG / CXR Will need CPAP QHS post extubation   CARDIOVASCULAR A:  Diastolic CHF with concern for Acute Decompensation Hx HTN, HLD, CAD, PAF (not on anticoagulation due to rectal bleeding), bradycardia s/p pacemaker  P:  CHF Service consulted, appreciate input  Continue lasix gtt, goal neg Follow CVP Continue preadmission amio, amlodipine, aspirin, atorvastatin, hydralazine  RENAL A:   AKI on CKD - baseline sr cr 2.1-2.6  Hypokalemia - resolved P:   Replace electrolytes as indicated Avoid nephrotoxic agents, ensure adequate renal perfusion  BMP in AM  GASTROINTESTINAL A:   Morbid Obesity  Mild Elevation of LFT's - 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  P:   NPO  Continue TF's  HEMATOLOGIC A:   DVT prophylaxis Hx of PE - LE duplex from 10/30 neg P:  SCD on right leg (no left leg given cellulitis) Heparin subcutaneous Trend CBC   INFECTIOUS A:   Possible LLE cellulitis Rule Out Viral Illness, PNA - RVP neg 10/29 P:   Continue keflex (started 10/30, stop 11/5 for 7 days total)  ENDOCRINE A:   DM - with CKD, resultant blindness    P:   Hold home oral agents SSI Q4 with resistant scale  NEUROLOGIC A:   Acute Metabolic Encephalopathy  P:   RASS goal: 0 to -1  Sedation: Precedex gtt / fentanyl PRN Minimize sedating medications  as able  Daily SBT / WUA   FAMILY  Updates: Daughter, sister and brother-in-law updated at bedside 10/29.  No family at bedside 10/31, 11/1.  Inter-disciplinary family meet or Palliative Care meeting due by:  11/5.  CC time: 30 min.  Montey Hora, [place] [facility] Pager: [phone]  or [phone] 06/16/2017, 7:37 AM  Attending Note:  71 year male with fluid overload and VDRF.  Patient is weaning on exam with crackles bilaterally.  I reviewed CXR myself, pulmonary edema and ETT in good position.  Discussed with PCCM-NP.  Will extubate today.  Full code status.  CVP 10, will continue lasix drip and add zaroxolyn 5 mg PO x1.  K-dur 40 mg PO q8 x2 doses.  CPAP QHS and while asleep.  PT evaluation and treatment.  IS per RT protocol.  Strict I/O.  The patient is critically ill with multiple organ systems failure and requires high complexity decision making for assessment and support, frequent evaluation and titration of therapies, application of advanced monitoring technologies and extensive interpretation of multiple databases.   Critical Care Time devoted to patient care services described in this note is  35  Minutes. This time reflects time of care of this signee Dr Jennet Maduro. This critical care time does not reflect procedure time, or teaching time or supervisory time of PA/NP/Med student/Med Resident etc but could involve care discussion time.  Rush Farmer, M.D. [facility] Pulmonary/Critical Care Medicine. Pager: [phone]. After hours pager: [phone].

## 2017-06-16 NOTE — Procedures (Signed)
Extubation Procedure Note  Patient Details:   Name: Covey Baller DOB: August 10, 1946 MRN: [id number]   Airway Documentation:     Evaluation  O2 sats: stable throughout Complications: No apparent complications Patient did tolerate procedure well. Bilateral Breath Sounds: Clear, Diminished   Yes   Pt extubated per MD order.  Pt placed on 6L [place] with sats of 93%.  RN at bedside.  RT will continue tto monitor.  Pierre Bali 06/16/2017, 10:05 AM

## 2017-06-16 NOTE — Progress Notes (Signed)
PCCM Interval Progress Note  Pt tolerating PSV at 5/5 well.  Ready for extubation.  If remains stable through lunch, will transfer to SDU and ask TRH to assume care starting AM 11/2 with PCCM off at that time.   Montey Hora, [place] [facility] Pager: [phone]  or [phone] 06/16/2017, 10:01 AM

## 2017-06-16 NOTE — Progress Notes (Signed)
PCCM Interval Note  Called to bedside to assess for ongoing bleeding out of left IJ.  Previous note from 2146 noted.  Found with pressure bag to site.  Coags wnl.  On assessment, large clot at insertion site, can not visualize incision site, no active bleeding around clot, area soft to palpation.  Large saturated area of blood on towels.  Clot/ dressing NOT removed.    P:  Agree- likely uremic bleeding Hold daily ASA 325 mg Continue pressure bag to area Await PLT on am labs Thrombi pad at bedside Will reassess after am labs since bleeding is stable at this point; might need additional suture   Kennieth Rad, AGACNP-BC [employer] [facility] & Critical Care Pgr: [phone] or if no answer [phone] 06/16/2017, 1:00 AM

## 2017-06-16 NOTE — Progress Notes (Signed)
[facility] Progress Note Patient Name: [name] DOB: March 17, 1946 MRN: [id number]   Date of Service  06/16/2017  HPI/Events of Note  Back pain Low dose fent  eICU Interventions       Intervention Category Intermediate Interventions: Pain - evaluation and management  Raylene Miyamoto. 06/16/2017, 11:12 PM

## 2017-06-16 NOTE — Progress Notes (Signed)
Patient ID: Thorsten Climer, male   DOB: 1946-01-17, 71 y.o.   MRN: [id number]     Advanced Heart Failure Rounding Note  Primary Cardiologist: Dr. Aundra Dubin   Subjective:    Admitted with acute respiratory failure requiring intubation. WBC trending down 18>15>14.4>13.   Intubated and sedated.    Lasix gtt started at 8 mg/hr and got dose of metolazone yesterday.  Good UOP and weight coming down.  CVP 12 this morning on my measurement.   Echo 06/14/17 LVEF 60-65%, Moderate AS  Objective:   Weight Range: (!) 332 lb 10.8 oz (150.9 kg) Body mass index is 45.12 kg/m.   Vital Signs:   Temp:  [98.3 F (36.8 C)-99.2 F (37.3 C)] 98.3 F (36.8 C) (11/01 0733) Pulse Rate:  [52-72] 53 (11/01 0807) Resp:  [14-24] 22 (11/01 0807) BP: (118-150)/(62-87) 124/65 (11/01 0807) SpO2:  [87 %-97 %] 92 % (11/01 0800) FiO2 (%):  [40 %-60 %] 40 % (11/01 0807) Weight:  [332 lb 10.8 oz (150.9 kg)] 332 lb 10.8 oz (150.9 kg) (11/01 0452) Last BM Date: 06/15/17 Weight change: Filed Weights   06/14/17 0500 06/15/17 0413 06/16/17 0452  Weight: (!) 337 lb 4.9 oz (153 kg) (!) 338 lb 13.6 oz (153.7 kg) (!) 332 lb 10.8 oz (150.9 kg)   Intake/Output:   Intake/Output Summary (Last 24 hours) at 06/16/17 0902 Last data filed at 06/16/17 0800  Gross per 24 hour  Intake          4125.61 ml  Output             4435 ml  Net          -309.39 ml    Physical Exam    General: Intubated/sedated.  Neck: Thick, difficult exam for JVP, no thyromegaly or thyroid nodule.  Lungs: Mild crackles at bases.  CV: Nondisplaced PMI.  Heart regular S1/S2, no S3/S4, 2/6 SEM RUSB.  1+ ankle edema.   Abdomen: Soft, no hepatosplenomegaly, no distention.  Skin: Intact without lesions or rashes.  Neurologic: Sedated on vent  Extremities: No clubbing or cyanosis.  HEENT: Normal.   Telemetry   A-paced 60s, Personally reviewed.   EKG    N/A  Labs    CBC  Recent Labs  06/15/17 0312 06/16/17 0328  WBC 14.4* 13.7*  HGB  13.7 12.8*  HCT 44.0 41.9  MCV 84.5 85.2  PLT 170 409   Basic Metabolic Panel  Recent Labs  06/15/17 0312 06/16/17 0328  NA 137 140  K 3.1* 3.6  CL 95* 99*  CO2 31 31  GLUCOSE 236* 230*  BUN 84* 86*  CREATININE 2.43* 2.19*  CALCIUM 8.7* 8.7*  MG 2.3 2.0  PHOS 4.2 3.4   Liver Function Tests  Recent Labs  06/14/17 0145  AST 47*  ALT 84*  ALKPHOS 98  BILITOT 1.1  PROT 7.1  ALBUMIN 2.5*   No results for input(s): LIPASE, AMYLASE in the last 72 hours. Cardiac Enzymes  Recent Labs  06/13/17 1504 06/13/17 1923 06/14/17 0145  TROPONINI <0.03 <0.03 <0.03    BNP: BNP (last 3 results)  Recent Labs  02/09/17 1155 03/23/17 1113 06/13/17 0805  BNP 47.5 78.0 158.5*    ProBNP (last 3 results) No results for input(s): PROBNP in the last 8760 hours.   D-Dimer No results for input(s): DDIMER in the last 72 hours. Hemoglobin A1C No results for input(s): HGBA1C in the last 72 hours. Fasting Lipid Panel  Recent Labs  06/13/17 1504  TRIG  78   Thyroid Function Tests No results for input(s): TSH, T4TOTAL, T3FREE, THYROIDAB in the last 72 hours.  Invalid input(s): FREET3  Other results:   Imaging    Dg Chest Port 1 View  Result Date: 06/16/2017 CLINICAL DATA:  Respiratory failure. EXAM: PORTABLE CHEST 1 VIEW COMPARISON:  06/15/2017 FINDINGS: The support apparatus is stable. Persistent cardiac enlargement, pulmonary edema and pleural effusions. IMPRESSION: Stable support apparatus. Persistent pulmonary edema and pleural effusions. Electronically Signed   By: Marijo Sanes M.D.   On: 06/16/2017 07:37   Dg Chest Port 1 View  Result Date: 06/15/2017 CLINICAL DATA:  Status post central line placement EXAM: PORTABLE CHEST 1 VIEW COMPARISON:  Portable chest x-ray of earlier today FINDINGS: There has been placement of a left internal jugular venous catheter. The tip projects over the proximal SVC at the junction of the right and left brachiocephalic veins. There  is no postprocedure pneumothorax. The cardiac silhouette remains enlarged. The pulmonary vascularity remains engorged and indistinct. There are probable bilateral pleural effusions layering posteriorly. The endotracheal tube tip lies 5.8 cm above the carina. The esophagogastric tube tip projects below the inferior margin of the image. The ICD is in stable position. IMPRESSION: No postprocedure complication following placement of the left internal jugular venous catheter. Electronically Signed   By: David  Martinique M.D.   On: 06/15/2017 14:01     Medications:     Scheduled Medications: . amiodarone  200 mg Per Tube Daily  . amLODipine  10 mg Oral Daily  . atorvastatin  40 mg Per Tube Daily  . cephALEXin  500 mg Per Tube Q8H  . chlorhexidine gluconate (MEDLINE KIT)  15 mL Mouth Rinse BID  . cholecalciferol  1,000 Units Oral Daily  . famotidine  20 mg Per Tube Daily  . feeding supplement (PRO-STAT SUGAR FREE 64)  30 mL Per Tube TID  . feeding supplement (VITAL HIGH PROTEIN)  1,000 mL Per Tube Q24H  . heparin  5,000 Units Subcutaneous Q8H  . hydrALAZINE  25 mg Per Tube TID  . insulin aspart  0-20 Units Subcutaneous Q4H  . insulin glargine  60 Units Subcutaneous Daily  . mouth rinse  15 mL Mouth Rinse QID  . metolazone  2.5 mg Oral Once  . potassium chloride  20 mEq Per Tube BID  . potassium chloride  20 mEq Per Tube TID  . rocuronium  100 mg Intravenous Once    Infusions: . sodium chloride 10 mL/hr at 06/16/17 0506  . dexmedetomidine (PRECEDEX) IV infusion 0.4 mcg/kg/hr (06/16/17 0800)  . furosemide (LASIX) infusion 8 mg/hr (06/15/17 2000)  . potassium chloride      PRN Medications: acetaminophen, fentaNYL (SUBLIMAZE) injection, fentaNYL (SUBLIMAZE) injection, midazolam, ondansetron (ZOFRAN) IV, senna-docusate    Patient Profile   Mr Masini is a 71 year old with history of chronic diastolic heart failure complicated by RV failure, CKD Stage III, symptomatic bradycardia, S/P PPM,  ICH 2015, PE 2015, OSA, DM  CAD nonobstructive LHC 2006, morbid obesity, and blindness.   Admitted with acute respiratory failure.  Intubated in the ED.   Assessment/Plan   1. Acute hypoxemic respiratory failure:  Initially on Bipap but later intubated.  CXR reviewed today, still looks like pulmonary edema.  - Remains intubated and sedated.  2. Chronic diastolic CHF: Echo (08/65) with EF 60-65%, moderate AS.  Now with central line, CVP 12 this morning.  Diuresed on Lasix gtt + metolazone yesterday, good UOP and weight down.  - Continue Lasix  gtt at 8 mg/hr + metolazone 2.5 mg x 1.  Want to see net negative again today.  - Replace K.   3. AKI on CKD Stage III: Stable creatinine today.  Watch closely with diuresis.   4. H/O PE 2015: Venous dopplers negative this admission.  5. ID: WBC Improving. ABX per primary. ?Viral PNA.  6. H/O Symptomatic Bradycardia: PPM 7. H/O ICH 2015: Not anticoagulated.    8. LLE Cellulitis: Continue ABX (cephalexin).  9. HTN:  Continue current meds.   10. Atrial fibrillation: Paroxysmal.  Not anticoagulated given history of intracerebral hemorrhage.  - Sinus brady on tele.  11. Aortic stenosis: Moderate on Echo 06/14/17.   Length of Stay: 3  Loralie Champagne, MD  06/16/2017, 9:02 AM  Advanced Heart Failure Team Pager [phone] (M-F; 7a - 4p)  Please contact [facility] for night-coverage after hours (4p -7a ) and weekends on [email or web address]

## 2017-06-17 ENCOUNTER — Inpatient Hospital Stay (HOSPITAL_COMMUNITY): Payer: Medicare (Managed Care)

## 2017-06-17 LAB — POCT I-STAT 3, ART BLOOD GAS (G3+)
ACID-BASE EXCESS: 14 mmol/L — AB (ref 0.0–2.0)
Bicarbonate: 39.7 mmol/L — ABNORMAL HIGH (ref 20.0–28.0)
O2 SAT: 97 %
TCO2: 41 mmol/L — ABNORMAL HIGH (ref 22–32)
pCO2 arterial: 54 mmHg — ABNORMAL HIGH (ref 32.0–48.0)
pH, Arterial: 7.474 — ABNORMAL HIGH (ref 7.350–7.450)
pO2, Arterial: 87 mmHg (ref 83.0–108.0)

## 2017-06-17 LAB — BASIC METABOLIC PANEL
Anion gap: 10 (ref 5–15)
BUN: 72 mg/dL — AB (ref 6–20)
CO2: 35 mmol/L — ABNORMAL HIGH (ref 22–32)
CREATININE: 2.15 mg/dL — AB (ref 0.61–1.24)
Calcium: 8.9 mg/dL (ref 8.9–10.3)
Chloride: 100 mmol/L — ABNORMAL LOW (ref 101–111)
GFR calc Af Amer: 34 mL/min — ABNORMAL LOW (ref >60)
GFR, EST NON AFRICAN AMERICAN: 30 mL/min — AB (ref >60)
Glucose, Bld: 121 mg/dL — ABNORMAL HIGH (ref 65–99)
POTASSIUM: 3.7 mmol/L (ref 3.5–5.1)
SODIUM: 145 mmol/L (ref 135–145)

## 2017-06-17 LAB — GLUCOSE, CAPILLARY
GLUCOSE-CAPILLARY: 136 mg/dL — AB (ref 65–99)
GLUCOSE-CAPILLARY: 137 mg/dL — AB (ref 65–99)
GLUCOSE-CAPILLARY: 147 mg/dL — AB (ref 65–99)
GLUCOSE-CAPILLARY: 164 mg/dL — AB (ref 65–99)
Glucose-Capillary: 173 mg/dL — ABNORMAL HIGH (ref 65–99)
Glucose-Capillary: 191 mg/dL — ABNORMAL HIGH (ref 65–99)

## 2017-06-17 LAB — CBC
HCT: 41.7 % (ref 39.0–52.0)
Hemoglobin: 12.7 g/dL — ABNORMAL LOW (ref 13.0–17.0)
MCH: 26.1 pg (ref 26.0–34.0)
MCHC: 30.5 g/dL (ref 30.0–36.0)
MCV: 85.6 fL (ref 78.0–100.0)
PLATELETS: 181 10*3/uL (ref 150–400)
RBC: 4.87 MIL/uL (ref 4.22–5.81)
RDW: 16.8 % — AB (ref 11.5–15.5)
WBC: 19.3 10*3/uL — AB (ref 4.0–10.5)

## 2017-06-17 LAB — MAGNESIUM: MAGNESIUM: 2 mg/dL (ref 1.7–2.4)

## 2017-06-17 LAB — PHOSPHORUS: Phosphorus: 4.1 mg/dL (ref 2.5–4.6)

## 2017-06-17 MED ORDER — POTASSIUM CHLORIDE 10 MEQ/50ML IV SOLN
10.0000 meq | INTRAVENOUS | Status: AC
  Administered 2017-06-17 (×4): 10 meq via INTRAVENOUS
  Filled 2017-06-17 (×4): qty 50

## 2017-06-17 MED ORDER — METOLAZONE 2.5 MG PO TABS
2.5000 mg | ORAL_TABLET | Freq: Once | ORAL | Status: AC
  Administered 2017-06-17: 2.5 mg via ORAL
  Filled 2017-06-17: qty 1

## 2017-06-17 MED ORDER — FUROSEMIDE 10 MG/ML IJ SOLN
8.0000 mg/h | INTRAMUSCULAR | Status: DC
  Administered 2017-06-17 – 2017-06-20 (×2): 8 mg/h via INTRAVENOUS
  Filled 2017-06-17 (×6): qty 25

## 2017-06-17 NOTE — Progress Notes (Addendum)
Patient ID: [name], male   DOB: 1946/07/24, 71 y.o.   MRN: [id number]     Advanced Heart Failure Rounding Note  Primary Cardiologist: Dr. Aundra Fletcher   Subjective:    Admitted with acute respiratory failure requiring intubation. WBC trend 18>15>14.4>13>19.   Intubated initially, extubated on 11/1 and now on NRBM this morning.   Lasix gtt started at 8 mg/hr and got dose of metolazone again yesterday.  Good UOP and weight coming down.  CVP 8 this morning but CXR still appears to show pulmonary edema on my review.  Creatinine coming down.   Some confusion this morning.   Echo 06/14/17 LVEF 60-65%, Moderate AS  Objective:   Weight Range: (!) 327 lb 2.6 oz (148.4 kg) Body mass index is 44.37 kg/m.   Vital Signs:   Temp:  [97.7 F (36.5 C)-99.4 F (37.4 C)] 98.4 F (36.9 C) (11/02 0320) Pulse Rate:  [52-87] 73 (11/02 0603) Resp:  [11-29] 22 (11/02 0603) BP: (124-156)/(60-97) 137/64 (11/02 0600) SpO2:  [84 %-99 %] 96 % (11/02 0603) FiO2 (%):  [40 %] 40 % (11/01 0925) Weight:  [327 lb 2.6 oz (148.4 kg)] 327 lb 2.6 oz (148.4 kg) (11/02 0437) Last BM Date: 06/15/17 Weight change: Filed Weights   06/15/17 0413 06/16/17 0452 06/17/17 0437  Weight: (!) 338 lb 13.6 oz (153.7 kg) (!) 332 lb 10.8 oz (150.9 kg) (!) 327 lb 2.6 oz (148.4 kg)   Intake/Output:   Intake/Output Summary (Last 24 hours) at 06/17/17 0703 Last data filed at 06/17/17 0400  Gross per 24 hour  Intake            972.6 ml  Output             4400 ml  Net          -3427.4 ml    Physical Exam    General: NRBM, mildly tachypneic Neck: Thick, JVP difficult, no thyromegaly or thyroid nodule.  Lungs: Crackles dependently.  CV: Nonpalpable PMI.  Heart regular S1/S2, no S3/S4, 1/6 SEM RUSB.  1+ ankle edema. Abdomen: Soft, nontender, no hepatosplenomegaly, no distention.  Skin: Intact without lesions or rashes.  Neurologic: Oriented to person/place, some confusion.  Extremities: No clubbing or cyanosis.  HEENT:  Normal.    Telemetry   NSR in 70s, personally reviewed.   EKG    N/A  Labs    CBC  Recent Labs  06/16/17 0328 06/17/17 0500  WBC 13.7* 19.3*  HGB 12.8* 12.7*  HCT 41.9 41.7  MCV 85.2 85.6  PLT 162 628   Basic Metabolic Panel  Recent Labs  06/16/17 0328 06/17/17 0500  NA 140 145  K 3.6 3.7  CL 99* 100*  CO2 31 35*  GLUCOSE 230* 121*  BUN 86* 72*  CREATININE 2.19* 2.15*  CALCIUM 8.7* 8.9  MG 2.0 2.0  PHOS 3.4 4.1   Liver Function Tests No results for input(s): AST, ALT, ALKPHOS, BILITOT, PROT, ALBUMIN in the last 72 hours. No results for input(s): LIPASE, AMYLASE in the last 72 hours. Cardiac Enzymes No results for input(s): CKTOTAL, CKMB, CKMBINDEX, TROPONINI in the last 72 hours.  BNP: BNP (last 3 results)  Recent Labs  02/09/17 1155 03/23/17 1113 06/13/17 0805  BNP 47.5 78.0 158.5*    ProBNP (last 3 results) No results for input(s): PROBNP in the last 8760 hours.   D-Dimer No results for input(s): DDIMER in the last 72 hours. Hemoglobin A1C No results for input(s): HGBA1C in the last 72 hours.  Fasting Lipid Panel No results for input(s): CHOL, HDL, LDLCALC, TRIG, CHOLHDL, LDLDIRECT in the last 72 hours. Thyroid Function Tests No results for input(s): TSH, T4TOTAL, T3FREE, THYROIDAB in the last 72 hours.  Invalid input(s): FREET3  Other results:   Imaging    No results found.   Medications:     Scheduled Medications: . amiodarone  200 mg Per Tube Daily  . amLODipine  10 mg Oral Daily  . atorvastatin  40 mg Per Tube Daily  . cephALEXin  500 mg Per Tube Q8H  . cholecalciferol  1,000 Units Oral Daily  . famotidine  20 mg Per Tube Daily  . heparin  5,000 Units Subcutaneous Q8H  . hydrALAZINE  25 mg Per Tube TID  . insulin aspart  0-20 Units Subcutaneous Q4H  . insulin glargine  60 Units Subcutaneous Daily  . mouth rinse  15 mL Mouth Rinse BID  . metolazone  2.5 mg Oral Once  . potassium chloride  20 mEq Per Tube BID  .  rocuronium  100 mg Intravenous Once    Infusions: . 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   Admitted with acute respiratory failure.  Intubated in the ED.   Assessment/Plan   1. Acute hypoxemic respiratory failure:  Initially on Bipap but later intubated => extubated 11/1 but remains on NRBM.  CXR reviewed today, still looks like pulmonary edema though some improvement.  2. Acute on chronic diastolic CHF: Echo (01/60) with EF 60-65%, moderate AS.  Now with central line, CVP 8 this morning but still pulmonary edema on CXR.  Diuresed on Lasix gtt + metolazone yesterday, good UOP and weight down. BUN/creatinine trending down.  - Continue Lasix gtt at 8 mg/hr + metolazone 2.5 mg x 1 again today.  Though CVP improving, CXR still wet and still on NRBM.  Possibly transition to po tomorrow (home regimen) if respiratory status improved.  - Replace K.   3. AKI on CKD Stage III: Creatinine coming down.  Watch closely with diuresis.   4. H/O PE 2015: Venous dopplers negative this admission.  5. ID: WBCs higher today but afebrile. ?Viral PNA.  6. H/O Symptomatic Bradycardia: PPM 7. H/O ICH 2015: Not anticoagulated.    8. LLE Cellulitis: Continue ABX (cephalexin).  9. HTN:  Continue current meds.   10. Atrial fibrillation: Paroxysmal.  Not anticoagulated given history of intracerebral hemorrhage.  - NSR on telemetry.   11. Aortic stenosis: Moderate on Echo 06/14/17.   Length of Stay: 4  Ryan Champagne, MD  06/17/2017, 7:03 AM  Advanced Heart Failure Team Pager [phone] (M-F; 7a - 4p)  Please contact [facility] for  night-coverage after hours (4p -7a ) and weekends on [email or web address]

## 2017-06-17 NOTE — Evaluation (Signed)
Physical Therapy Evaluation Patient Details Name: [name] MRN: [id number] DOB: [birth year] Today's Date: 06/17/2017   History of Present Illness  Pt adm with acute respiratory failure due to decompensated diastolic heart failure. Pt aslo with AKI on CKD. Intubated 10/29-11/1. PMH - blindness, lt transmetatarsal amputation, ckd, morbid obesity, dm, htn, pacer, cad, blind  Clinical Impression  Pt admitted with above diagnosis and presents to PT with functional limitations due to deficits listed below (See PT problem list). Pt needs skilled PT to maximize independence and safety to allow discharge to ST-SNF prior to return home with aides and [facility]. Pt is currently dependent with all mobility and even with support of aides and family don't feel he can return home without post acute rehab.      Follow Up Recommendations SNF    Equipment Recommendations  None recommended by PT    Recommendations for Other Services       Precautions / Restrictions Precautions Precautions: Fall Precaution Comments: blind Restrictions Weight Bearing Restrictions: No      Mobility  Bed Mobility Overal bed mobility: Needs Assistance Bed Mobility: Supine to Sit;Sit to Supine     Supine to sit: +2 for physical assistance;Total assist Sit to supine: +2 for physical assistance;Total assist   General bed mobility comments: Assist for all aspects. Pt not assisting with any functional mobility.   Transfers                 General transfer comment: Unable to attempt  Ambulation/Gait                Stairs            Wheelchair Mobility    Modified Rankin (Stroke Patients Only)       Balance Overall balance assessment: Needs assistance Sitting-balance support: Feet supported;Bilateral upper extremity supported Sitting balance-Leahy Scale: Zero Sitting balance - Comments: Pt sat EOB x 6-8 minutes with max assist  Postural control: Posterior lean;Right lateral lean                                    Pertinent Vitals/Pain Pain Assessment: Faces Faces Pain Scale: Hurts even more Pain Location: difficult to localize. Pt stated rt wrist but when I moved it no signs of pain. Pain Descriptors / Indicators: Grimacing (yelling out) Pain Intervention(s): Limited activity within patient's tolerance;Monitored during session;Repositioned    Home Living Family/patient expects to be discharged to:: Private residence Living Arrangements: Alone Available Help at Discharge: Family;Personal care attendant Type of Home: House Home Access: Ramped entrance     Home Layout: One level Home Equipment: Environmental consultant - 4 wheels;Bedside commode;Shower seat Additional Comments: Has aides multiple days/times during the week. Family also checks on .    Prior Function Level of Independence: Needs assistance   Gait / Transfers Assistance Needed: Amb household modified independent with rollator.     Comments: Pt is blind     Hand Dominance   Dominant Hand: Right    Extremity/Trunk Assessment   Upper Extremity Assessment Upper Extremity Assessment: Defer to OT evaluation    Lower Extremity Assessment Lower Extremity Assessment: RLE deficits/detail;LLE deficits/detail RLE Deficits / Details: Unable to fully assess due to cognition. Pt moving LE very little and assisting with mobility very little. RLE Sensation: history of peripheral neuropathy LLE Deficits / Details: Unable to fully assess due to cognition. Pt moving LE very little and assisting with mobility very  little. LLE Sensation: history of peripheral neuropathy       Communication   Communication: No difficulties  Cognition Arousal/Alertness: Awake/alert Behavior During Therapy: Flat affect Overall Cognitive Status: Impaired/Different from baseline Area of Impairment: Orientation;Attention;Memory;Safety/judgement;Following commands;Problem solving                 Orientation Level:  Disoriented to;Time;Place;Situation Current Attention Level: Sustained Memory: Decreased short-term memory Following Commands: Follows one step commands inconsistently Safety/Judgement: Decreased awareness of safety;Decreased awareness of deficits   Problem Solving: Slow processing;Decreased initiation;Difficulty sequencing;Requires verbal cues;Requires tactile cues General Comments: Pt repeatedly saying "help me, help me" but unable to state what he needed      General Comments      Exercises     Assessment/Plan    PT Assessment Patient needs continued PT services  PT Problem List Decreased strength;Decreased activity tolerance;Decreased balance;Decreased mobility;Decreased cognition;Decreased knowledge of use of DME;Impaired sensation;Obesity;Pain       PT Treatment Interventions DME instruction;Gait training;Therapeutic activities;Functional mobility training;Therapeutic exercise;Balance training;Patient/family education;Cognitive remediation    PT Goals (Current goals can be found in the Care Plan section)  Acute Rehab PT Goals Patient Stated Goal: return home PT Goal Formulation: With patient/family Time For Goal Achievement: 07/01/17 Potential to Achieve Goals: Fair    Frequency Min 3X/week   Barriers to discharge Decreased caregiver support Doesn't currently have 24 hour assist    Co-evaluation               AM-PAC PT "6 Clicks" Daily Activity  Outcome Measure Difficulty turning over in bed (including adjusting bedclothes, sheets and blankets)?: Unable Difficulty moving from lying on back to sitting on the side of the bed? : Unable Difficulty sitting down on and standing up from a chair with arms (e.g., wheelchair, bedside commode, etc,.)?: Unable Help needed moving to and from a bed to chair (including a wheelchair)?: Total Help needed walking in hospital room?: Total Help needed climbing 3-5 steps with a railing? : Total 6 Click Score: 6    End of  Session Equipment Utilized During Treatment: Oxygen Activity Tolerance: Patient limited by fatigue Patient left: in bed;with call bell/phone within reach;with family/visitor present (in  chair position) Nurse Communication: Mobility status;Need for lift equipment PT Visit Diagnosis: Other abnormalities of gait and mobility (R26.89);Muscle weakness (generalized) (M62.81);Difficulty in walking, not elsewhere classified (R26.2)    Time: 3662-9476 PT Time Calculation (min) (ACUTE ONLY): 24 min   Charges:   PT Evaluation $PT Eval High Complexity: 1 High PT Treatments $Therapeutic Activity: 8-22 mins   PT G Codes:        [facility] [place] 06/17/2017, 5:24 PM

## 2017-06-17 NOTE — Progress Notes (Signed)
PULMONARY / CRITICAL CARE MEDICINE   Name: [name] MRN: [id number] DOB: 09-13-1945    ADMISSION DATE:  06/13/2017 CONSULTATION DATE:  06/13/17  REFERRING MD:  Dr. Vanita Panda / EDP   CHIEF COMPLAINT:  Shortness of Breath   HISTORY OF PRESENT ILLNESS:  71 y/o M who presented to [facility] on 10/29 with reports of acute shortness of breath.    The patient is on BiPAP.  Information obtained from patient (as able), chart review and family at bedside.  The patient lives at home with the assistance of home health aides.  They are in/out of his home frequently during the day.  They assist him with all aspects of care.  In addition, [facility] 2-[facility] times per week.  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 Family reports that some of the aides are good about keeping him to a low sodium diet, others are not.  They are unsure if he actually follows his fluid restriction.  Daughter thinks he was recently taken off a "blood thinner" but she is unsure (none listed on home medication list).  He does carry a hx of PE and prior anticoagulation. Family (sister, daughter and brother in law deny knowledge of him ever smoking). They state he has been on the vent before but are unsure if he would want it again.   He was recently seen in the ER on 10/23 for dizziness and vomiting that resolved with meclizine and zofran. The patient denies known sick contacts. EMS was called for acute shortness of breath.  He was placed on CPAP and transported to the ER.  In the ER, he was found to be hypoxic requiring BiPAP 16/8 with 100% FiO2 (he dropped to the 80's on 80% O2 per RT).  Initial CXR shows concern for pulmonary edema with pleural effusion.  Initial labs- Na 135, K 5.1, CL 90, CO2 30, BUN 83, CR 3.30 (up from 2.49 on 10/23, baseline 2.1-2.6 in the last year), glucose 166, AST 78 / ALT 97, T.Bili 1.8, Troponin <0.03, WBC 18.2, Hgb 14.3 and platelets 192.  He is unable to describe further symptoms  due to BiPAP mask.   He is followed by the CHF clinic and was most recently seen on 03/23/17 by Oda Kilts, PA-C.  At that time his office weight was 339 lbs.  In 12/2016 he was 329lbs.  On 10/23 in the ER he was 340 lbs.    SUBJECTIVE:  No events overnight, tolerated extubation  VITAL SIGNS: BP (!) 155/73   Pulse 78   Temp 98.6 F (37 C) (Axillary)   Resp 18   Wt (!) 148.4 kg (327 lb 2.6 oz)   SpO2 (!) 88%   BMI 44.37 kg/m   HEMODYNAMICS: CVP:  [6 mmHg-10 mmHg] 8 mmHg  VENTILATOR SETTINGS:    INTAKE / OUTPUT: I/O last 3 completed shifts: In: 2499.2 [P.O.:240; I.V.:1109.2; NG/GT:1050; IV Piggyback:100] Out: 7410 [Urine:7410]  PHYSICAL EXAMINATION: General: chronically ill appearing male, in NAD HEENT: Guin/AT, PERRL, EOM-I and MMM Neuro: Alert and interactive, moving all ext to command CV: RRR, Nl S1/S2, -M/R/G. PULM: Bibasilar crackles GI: Soft, NT, ND and +BS Extremities: Erythema Skin: Intact  LABS:  BMET  Recent Labs Lab 06/15/17 0312 06/16/17 0328 06/17/17 0500  NA 137 140 145  K 3.1* 3.6 3.7  CL 95* 99* 100*  CO2 31 31 35*  BUN 84* 86* 72*  CREATININE 2.43* 2.19* 2.15*  GLUCOSE 236* 230* 121*   Electrolytes  Recent Labs Lab 06/15/17 0312 06/16/17 0328 06/17/17 0500  CALCIUM 8.7* 8.7* 8.9  MG 2.3 2.0 2.0  PHOS 4.2 3.4 4.1   CBC  Recent Labs Lab 06/15/17 0312 06/16/17 0328 06/17/17 0500  WBC 14.4* 13.7* 19.3*  HGB 13.7 12.8* 12.7*  HCT 44.0 41.9 41.7  PLT 170 162 181   Coag's  Recent Labs Lab 06/15/17 2207  APTT 31  INR 1.20    Sepsis Markers No results for input(s): LATICACIDVEN, PROCALCITON, O2SATVEN in the last 168 hours.  ABG  Recent Labs Lab 06/15/17 0937 06/16/17 0415 06/17/17 0631  PHART 7.479* 7.454* 7.474*  PCO2ART 47.6 46.4 54.0*  PO2ART 78.0* 76.8* 87.0    Liver Enzymes  Recent Labs Lab 06/13/17 0806 06/14/17 0145  AST 78* 47*  ALT 97* 84*  ALKPHOS 104 98  BILITOT 1.8* 1.1  ALBUMIN 3.2* 2.5*     Cardiac Enzymes  Recent Labs Lab 06/13/17 1504 06/13/17 1923 06/14/17 0145  TROPONINI <0.03 <0.03 <0.03    Glucose  Recent Labs Lab 06/16/17 1204 06/16/17 1531 06/16/17 1937 06/16/17 2327 06/17/17 0318 06/17/17 0812  GLUCAP 126* 114* 162* 169* 137* 136*    Imaging Dg Chest Port 1 View  Result Date: 06/17/2017 CLINICAL DATA:  Respiratory failure EXAM: PORTABLE CHEST 1 VIEW COMPARISON:  06/16/2017 FINDINGS: Right subclavian pacemaker device and leads are stable. Left jugular central venous catheter stable. Endotracheal and NG tubes removed. Vascular congestion is stable. Opacity in the mid and lower lung zones is stable likely combination of pleural fluid and airspace disease. Increasing interstitial prominence suggests worsening interstitial edema. Stable cardiomegaly. No pneumothorax. IMPRESSION: Extubated. Worsening pulmonary edema. Electronically Signed   By: Marybelle Killings M.D.   On: 06/17/2017 07:31    STUDIES:  ECHO 10/29 >> EF 60-65%, IVC dilated. BLE Venous Duplex 10/29 >> neg PCXR 11/1 > edema  CULTURES: RVP 10/29 >> neg  ANTIBIOTICS: Keflex 10/30 >   SIGNIFICANT EVENTS: 10/29  Admit with acute SOB  LINES/TUBES: ETT 10/29 > 10/31 >10/31>11/1 L IJ CVL 10/31 >   I reviewed CXR myself, pulmonary edema persistent  DISCUSSION: 71 y/o M with a complex medical history admitted 10/29 with acute onset SOB.  Suspect decompensated diastolic CHF but viral illness, PE also on differential (given high O2 needs).    ASSESSMENT / PLAN:  PULMONARY A: Acute Hypoxic Respiratory Failure - in setting of AKI, decompensated CHF, r/o PNA, viral illness, PE OSA - baseline CPAP 12cm H2O Hx PE in 2015 - not on anticoagulation due to rectal bleeding P:   Ambulate Titrate O2 for sat of 88-92% IS per RT protocol Continue lasix gtt, goal neg balance CPAP at night  CARDIOVASCULAR A:  Diastolic CHF with concern for Acute Decompensation Hx HTN, HLD, CAD, PAF (not on  anticoagulation due to rectal bleeding), bradycardia s/p pacemaker  P:  CHF Service consulted, appreciate input  Continue lasix gtt, goal neg per CHF service Follow CVP Continue preadmission amio, amlodipine, aspirin, atorvastatin, hydralazine  RENAL A:   AKI on CKD - baseline sr cr 2.1-2.6  Hypokalemia - resolved P:   Replace electrolytes as indicated Avoid nephrotoxic agents, ensure adequate renal perfusion  BMP in AM  GASTROINTESTINAL A:   Morbid Obesity  Mild Elevation of LFT's - 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  P:   Diet as ordered  HEMATOLOGIC A:   DVT prophylaxis Hx of PE - LE duplex from 10/30 neg P:  SCD on right leg (no  left leg given cellulitis) Heparin subcutaneous, hold next dose given bleeding from central line then restart Trend CBC   INFECTIOUS A:   Possible LLE cellulitis Rule Out Viral Illness, PNA - RVP neg 10/29 P:   Continue keflex (started 10/30, stop 11/5 for 7 days total)  ENDOCRINE A:   DM - with CKD, resultant blindness    P:   Hold home oral agents SSI Q4 with resistant scale  NEUROLOGIC A:   Acute Metabolic Encephalopathy  P:   D/C all sedation  FAMILY  Updates: Patient and daughter updated bedside, transfer to SDU and to [facility] service with PCCM off 11/3.  Discussed with TRH-MD and PCCM-NP.  Rush Farmer, M.D. [facility] Pulmonary/Critical Care Medicine. Pager: [phone]. After hours pager: [phone].

## 2017-06-17 NOTE — Care Management Note (Signed)
Case Management Note  Patient Details  Name: [name] MRN: [id number] Date of Birth: Feb 26, 1946  Subjective/Objective:     Pt admitted with SOB, AKI and CHF (on lasix drip)            Action/Plan:  PTA from home with family.  CM spoke with [facility] and informed of admit.   Pt attends [facility] during the week.  [facility] arranges for [facility] aides in the home and will resume at discharge.  Pt will need PT eval once stable   Expected Discharge Date:                  Expected Discharge Plan:  Home/Self Care  In-House Referral:     Discharge planning Services  CM Consult  Post Acute Care Choice:    Choice offered to:     DME Arranged:    DME Agency:     HH Arranged:    HH Agency:     Status of Service:     If discussed at [employer], dates discussed:    Additional Comments:  Maryclare Labrador, RN 06/17/2017, 3:42 PM

## 2017-06-17 NOTE — Progress Notes (Signed)
Notified MD of central line bleeding, will continue with pressure dressing treatment as instructed.

## 2017-06-17 NOTE — Progress Notes (Signed)
[place] Progress Note Patient Name: Dillen Belmontes DOB: 03/14/1946 MRN: [id number]   Date of Service  06/17/2017  HPI/Events of Note  Some confusion abg  eICU Interventions       Intervention Category Major Interventions: Delirium, psychosis, severe agitation - evaluation and management  Raylene Miyamoto. 06/17/2017, 6:16 AM

## 2017-06-18 ENCOUNTER — Inpatient Hospital Stay (HOSPITAL_COMMUNITY): Payer: Medicare (Managed Care)

## 2017-06-18 DIAGNOSIS — Z79899 Other long term (current) drug therapy: Secondary | ICD-10-CM

## 2017-06-18 DIAGNOSIS — I872 Venous insufficiency (chronic) (peripheral): Secondary | ICD-10-CM

## 2017-06-18 DIAGNOSIS — Z794 Long term (current) use of insulin: Secondary | ICD-10-CM

## 2017-06-18 DIAGNOSIS — E1122 Type 2 diabetes mellitus with diabetic chronic kidney disease: Secondary | ICD-10-CM

## 2017-06-18 DIAGNOSIS — L280 Lichen simplex chronicus: Secondary | ICD-10-CM

## 2017-06-18 DIAGNOSIS — E118 Type 2 diabetes mellitus with unspecified complications: Secondary | ICD-10-CM

## 2017-06-18 DIAGNOSIS — L03116 Cellulitis of left lower limb: Secondary | ICD-10-CM

## 2017-06-18 DIAGNOSIS — L814 Other melanin hyperpigmentation: Secondary | ICD-10-CM

## 2017-06-18 DIAGNOSIS — I130 Hypertensive heart and chronic kidney disease with heart failure and stage 1 through stage 4 chronic kidney disease, or unspecified chronic kidney disease: Principal | ICD-10-CM

## 2017-06-18 DIAGNOSIS — Z8719 Personal history of other diseases of the digestive system: Secondary | ICD-10-CM

## 2017-06-18 DIAGNOSIS — N183 Chronic kidney disease, stage 3 (moderate): Secondary | ICD-10-CM

## 2017-06-18 DIAGNOSIS — G4733 Obstructive sleep apnea (adult) (pediatric): Secondary | ICD-10-CM

## 2017-06-18 LAB — BASIC METABOLIC PANEL
ANION GAP: 12 (ref 5–15)
Anion gap: 10 (ref 5–15)
BUN: 60 mg/dL — ABNORMAL HIGH (ref 6–20)
BUN: 60 mg/dL — AB (ref 6–20)
CALCIUM: 9.1 mg/dL (ref 8.9–10.3)
CALCIUM: 9.1 mg/dL (ref 8.9–10.3)
CHLORIDE: 101 mmol/L (ref 101–111)
CHLORIDE: 101 mmol/L (ref 101–111)
CO2: 33 mmol/L — AB (ref 22–32)
CO2: 33 mmol/L — AB (ref 22–32)
CREATININE: 2.05 mg/dL — AB (ref 0.61–1.24)
Creatinine, Ser: 2.12 mg/dL — ABNORMAL HIGH (ref 0.61–1.24)
GFR calc non Af Amer: 30 mL/min — ABNORMAL LOW (ref >60)
GFR calc non Af Amer: 31 mL/min — ABNORMAL LOW (ref >60)
GFR, EST AFRICAN AMERICAN: 35 mL/min — AB (ref >60)
GFR, EST AFRICAN AMERICAN: 36 mL/min — AB (ref >60)
GLUCOSE: 178 mg/dL — AB (ref 65–99)
Glucose, Bld: 145 mg/dL — ABNORMAL HIGH (ref 65–99)
Potassium: 3.8 mmol/L (ref 3.5–5.1)
Potassium: 4.2 mmol/L (ref 3.5–5.1)
SODIUM: 146 mmol/L — AB (ref 135–145)
Sodium: 144 mmol/L (ref 135–145)

## 2017-06-18 LAB — BLOOD GAS, ARTERIAL
ACID-BASE EXCESS: 10.3 mmol/L — AB (ref 0.0–2.0)
Acid-Base Excess: 11.3 mmol/L — ABNORMAL HIGH (ref 0.0–2.0)
BICARBONATE: 34.9 mmol/L — AB (ref 20.0–28.0)
BICARBONATE: 35.7 mmol/L — AB (ref 20.0–28.0)
Delivery systems: POSITIVE
Drawn by: 51155
Drawn by: 511851
EXPIRATORY PAP: 10
FIO2: 70
INSPIRATORY PAP: 20
O2 SAT: 95.4 %
O2 Saturation: 91.2 %
PATIENT TEMPERATURE: 99.4
PCO2 ART: 50.7 mmHg — AB (ref 32.0–48.0)
PH ART: 7.442 (ref 7.350–7.450)
PO2 ART: 63.3 mmHg — AB (ref 83.0–108.0)
PO2 ART: 79.8 mmHg — AB (ref 83.0–108.0)
Patient temperature: 98.6
RATE: 12 resp/min
pCO2 arterial: 52 mmHg — ABNORMAL HIGH (ref 32.0–48.0)
pH, Arterial: 7.463 — ABNORMAL HIGH (ref 7.350–7.450)

## 2017-06-18 LAB — CBC
HEMATOCRIT: 43.1 % (ref 39.0–52.0)
HEMOGLOBIN: 12.9 g/dL — AB (ref 13.0–17.0)
MCH: 25.9 pg — ABNORMAL LOW (ref 26.0–34.0)
MCHC: 29.9 g/dL — ABNORMAL LOW (ref 30.0–36.0)
MCV: 86.4 fL (ref 78.0–100.0)
Platelets: 187 10*3/uL (ref 150–400)
RBC: 4.99 MIL/uL (ref 4.22–5.81)
RDW: 16.9 % — ABNORMAL HIGH (ref 11.5–15.5)
WBC: 20.5 10*3/uL — AB (ref 4.0–10.5)

## 2017-06-18 LAB — GLUCOSE, CAPILLARY
GLUCOSE-CAPILLARY: 141 mg/dL — AB (ref 65–99)
GLUCOSE-CAPILLARY: 171 mg/dL — AB (ref 65–99)
Glucose-Capillary: 150 mg/dL — ABNORMAL HIGH (ref 65–99)
Glucose-Capillary: 228 mg/dL — ABNORMAL HIGH (ref 65–99)
Glucose-Capillary: 312 mg/dL — ABNORMAL HIGH (ref 65–99)

## 2017-06-18 LAB — SEDIMENTATION RATE: Sed Rate: 100 mm/hr — ABNORMAL HIGH (ref 0–16)

## 2017-06-18 LAB — PROCALCITONIN: PROCALCITONIN: 0.4 ng/mL

## 2017-06-18 LAB — MAGNESIUM: MAGNESIUM: 2.1 mg/dL (ref 1.7–2.4)

## 2017-06-18 LAB — PHOSPHORUS: PHOSPHORUS: 3.9 mg/dL (ref 2.5–4.6)

## 2017-06-18 MED ORDER — ACETAZOLAMIDE SODIUM 500 MG IJ SOLR
250.0000 mg | Freq: Three times a day (TID) | INTRAMUSCULAR | Status: AC
  Administered 2017-06-18 (×2): 250 mg via INTRAVENOUS
  Filled 2017-06-18 (×4): qty 250

## 2017-06-18 MED ORDER — INSULIN ASPART 100 UNIT/ML ~~LOC~~ SOLN
0.0000 [IU] | Freq: Every day | SUBCUTANEOUS | Status: DC
  Administered 2017-06-18 – 2017-06-20 (×3): 4 [IU] via SUBCUTANEOUS
  Administered 2017-06-21: 5 [IU] via SUBCUTANEOUS
  Administered 2017-06-22: 2 [IU] via SUBCUTANEOUS

## 2017-06-18 MED ORDER — INSULIN GLARGINE 100 UNIT/ML ~~LOC~~ SOLN
60.0000 [IU] | Freq: Every day | SUBCUTANEOUS | Status: DC
  Administered 2017-06-19 – 2017-06-20 (×2): 60 [IU] via SUBCUTANEOUS
  Filled 2017-06-18 (×2): qty 0.6

## 2017-06-18 MED ORDER — METHYLPREDNISOLONE SODIUM SUCC 125 MG IJ SOLR
60.0000 mg | Freq: Four times a day (QID) | INTRAMUSCULAR | Status: DC
  Administered 2017-06-18 – 2017-06-21 (×12): 60 mg via INTRAVENOUS
  Filled 2017-06-18 (×12): qty 2

## 2017-06-18 MED ORDER — LEVOFLOXACIN IN D5W 750 MG/150ML IV SOLN
750.0000 mg | INTRAVENOUS | Status: AC
  Administered 2017-06-18 – 2017-06-22 (×3): 750 mg via INTRAVENOUS
  Filled 2017-06-18 (×5): qty 150

## 2017-06-18 MED ORDER — HYDRALAZINE HCL 20 MG/ML IJ SOLN
5.0000 mg | Freq: Three times a day (TID) | INTRAMUSCULAR | Status: DC | PRN
Start: 2017-06-18 — End: 2017-06-25
  Administered 2017-06-19: 5 mg via INTRAVENOUS
  Filled 2017-06-18: qty 1

## 2017-06-18 MED ORDER — INSULIN ASPART 100 UNIT/ML ~~LOC~~ SOLN
0.0000 [IU] | Freq: Three times a day (TID) | SUBCUTANEOUS | Status: DC
  Administered 2017-06-19 – 2017-06-20 (×4): 20 [IU] via SUBCUTANEOUS
  Administered 2017-06-20: 15 [IU] via SUBCUTANEOUS
  Administered 2017-06-20 – 2017-06-21 (×2): 20 [IU] via SUBCUTANEOUS
  Administered 2017-06-21: 11 [IU] via SUBCUTANEOUS
  Administered 2017-06-21 – 2017-06-22 (×2): 15 [IU] via SUBCUTANEOUS
  Administered 2017-06-22: 7 [IU] via SUBCUTANEOUS
  Administered 2017-06-22: 15 [IU] via SUBCUTANEOUS
  Administered 2017-06-23: 4 [IU] via SUBCUTANEOUS
  Administered 2017-06-23: 7 [IU] via SUBCUTANEOUS
  Administered 2017-06-24: 4 [IU] via SUBCUTANEOUS

## 2017-06-18 MED ORDER — INSULIN GLARGINE 100 UNIT/ML ~~LOC~~ SOLN: 30.0000 [IU] | Freq: Every day | SUBCUTANEOUS | Status: DC

## 2017-06-18 NOTE — Progress Notes (Signed)
RT found O2 at 60%.

## 2017-06-18 NOTE — Progress Notes (Signed)
Pharmacy Antibiotic Note  [name] is a 71 y.o. male admitted on 06/13/2017 with pneumonia.  Pharmacy has been consulted for levofloxacin dosing. Pt has worsening respiratory status and CXR has worsened, changing cephalexin to levofloxacin for atypical coverage. SCr has remained stable ~48 ml/min, WBC up but pt afebrile  Plan: -Levofloxacin 750mg  IV q48h -Watch renal funx, LOT, cultures  Weight: (!) 317 lb 14.5 oz (144.2 kg)  Temp (24hrs), Avg:98.7 F (37.1 C), Min:98.1 F (36.7 C), Max:99.4 F (37.4 C)   Recent Labs Lab 06/14/17 0145 06/14/17 1506 06/15/17 0312 06/16/17 0328 06/17/17 0500 06/18/17 0343  WBC 15.1*  --  14.4* 13.7* 19.3* 20.5*  CREATININE 2.85* 2.55* 2.43* 2.19* 2.15* 2.12*    Estimated Creatinine Clearance: 48.5 mL/min (A) (by C-G formula based on SCr of 2.12 mg/dL (H)).    No Known Allergies  Antimicrobials this admission: Keflex 10/30 >> 11/3 Levofloxacin 11/3 >>  Dose adjustments this admission: none  Microbiology results: 10/29 MRSA PCR - negative 10/29 resp panel PCR - negative  Thank you for allowing pharmacy to be a part of this patient's care.  Arrie Senate, PharmD PGY-2 Cardiology Pharmacy Resident Pager: [phone] 06/18/2017

## 2017-06-18 NOTE — Progress Notes (Addendum)
Patient ID: [name], male   DOB: 02-19-1946, 71 y.o.   MRN: [id number]     Advanced Heart Failure Rounding Note  Primary Cardiologist: Dr. Aundra Dubin   Subjective:    Admitted with acute respiratory failure requiring intubation. WBC trend 18>15>14.4>13>19.   Intubated initially, extubated on 11/1 and now on NRBM this morning.   CVP down to 8 yesterday but CXR was worse. Lsix gtt continued at 8 and given metolazone. However last night had recurrent respiratory distress and BIPAP restarted.  ABG 7.44/52/63. CXR with worsening edema (viewed personally)  Diuresed well. Weight down 10 pounds this am. Creatinine stable at 2.1. Lasix gtt remains at 8/hr. Sodium climbing. CVP line not hooked up.   On bipap says he feels better but falls back to sleep quickly.   Echo 06/14/17 LVEF 60-65%, Moderate AS  Objective:   Weight Range: (!) 144.2 kg (317 lb 14.5 oz) Body mass index is 43.12 kg/m.   Vital Signs:   Temp:  [98.1 F (36.7 C)-99.4 F (37.4 C)] 99.4 F (37.4 C) (11/03 0300) Pulse Rate:  [78-90] 85 (11/03 0727) Resp:  [16-28] 16 (11/03 0727) BP: (113-166)/(52-95) 113/52 (11/03 0504) SpO2:  [74 %-100 %] 97 % (11/03 0727) FiO2 (%):  [70 %] 70 % (11/03 0727) Weight:  [144.2 kg (317 lb 14.5 oz)] 144.2 kg (317 lb 14.5 oz) (11/03 0300) Last BM Date: 06/16/17 Weight change: Filed Weights   06/16/17 0452 06/17/17 0437 06/18/17 0300  Weight: (!) 150.9 kg (332 lb 10.8 oz) (!) 148.4 kg (327 lb 2.6 oz) (!) 144.2 kg (317 lb 14.5 oz)   Intake/Output:   Intake/Output Summary (Last 24 hours) at 06/18/17 1036 Last data filed at 06/18/17 0600  Gross per 24 hour  Intake           459.74 ml  Output             2450 ml  Net         -1990.26 ml    Physical Exam    General:  Obese male lying in bed on bipap. Arouses to commands and then goes back to sleep HEENT: normal Neck: supple. Unable to see JVP. Carotids 2+ bilat; no bruits. No lymphadenopathy or thryomegaly appreciated. Cor: PMI  nonpalpable  Regular rate & rhythm. Distant HS Lungs: decreased throughout anteriorly  Abdomen: morbidly obes soft, nontender, nondistended. No obvious hepatosplenomegaly. No bruits or masses. Good bowel sounds. Extremities: no cyanosis, clubbing, rash, edema +SCDs Neuro: awake but falls asleep easily  cranial nerves grossly intact. moves all 4 extremities w/o difficulty. Affect pleasant     Telemetry   NSR in 80s, personally reviewed.   EKG    N/A  Labs    CBC  Recent Labs  06/17/17 0500 06/18/17 0343  WBC 19.3* 20.5*  HGB 12.7* 12.9*  HCT 41.7 43.1  MCV 85.6 86.4  PLT 181 947   Basic Metabolic Panel  Recent Labs  06/17/17 0500 06/18/17 0343  NA 145 146*  K 3.7 3.8  CL 100* 101  CO2 35* 33*  GLUCOSE 121* 145*  BUN 72* 60*  CREATININE 2.15* 2.12*  CALCIUM 8.9 9.1  MG 2.0 2.1  PHOS 4.1 3.9   Liver Function Tests No results for input(s): AST, ALT, ALKPHOS, BILITOT, PROT, ALBUMIN in the last 72 hours. No results for input(s): LIPASE, AMYLASE in the last 72 hours. Cardiac Enzymes No results for input(s): CKTOTAL, CKMB, CKMBINDEX, TROPONINI in the last 72 hours.  BNP: BNP (last 3 results)  Recent Labs  02/09/17 1155 03/23/17 1113 06/13/17 0805  BNP 47.5 78.0 158.5*    ProBNP (last 3 results) No results for input(s): PROBNP in the last 8760 hours.   D-Dimer No results for input(s): DDIMER in the last 72 hours. Hemoglobin A1C No results for input(s): HGBA1C in the last 72 hours. Fasting Lipid Panel No results for input(s): CHOL, HDL, LDLCALC, TRIG, CHOLHDL, LDLDIRECT in the last 72 hours. Thyroid Function Tests No results for input(s): TSH, T4TOTAL, T3FREE, THYROIDAB in the last 72 hours.  Invalid input(s): FREET3  Other results:   Imaging    Dg Chest Port 1 View  Result Date: 06/18/2017 CLINICAL DATA:  Respiratory distress, desaturation. EXAM: PORTABLE CHEST 1 VIEW COMPARISON:  Radiographs yesterday at 0506 hour FINDINGS: Pacemaker  remains in place. Cardiomegaly is unchanged. Worsening pulmonary edema from prior exam. Hazy lung base opacities consistent with pleural effusions. No pneumothorax. IMPRESSION: Worsening pulmonary edema. Unchanged cardiomegaly and pleural effusions. Electronically Signed   By: Jeb Levering M.D.   On: 06/18/2017 05:23     Medications:     Scheduled Medications: . amiodarone  200 mg Per Tube Daily  . amLODipine  10 mg Oral Daily  . atorvastatin  40 mg Per Tube Daily  . cephALEXin  500 mg Per Tube Q8H  . cholecalciferol  1,000 Units Oral Daily  . famotidine  20 mg Per Tube Daily  . heparin  5,000 Units Subcutaneous Q8H  . hydrALAZINE  25 mg Per Tube TID  . insulin aspart  0-20 Units Subcutaneous Q4H  . insulin glargine  60 Units Subcutaneous Daily  . mouth rinse  15 mL Mouth Rinse BID  . metolazone  2.5 mg Oral Once  . potassium chloride  20 mEq Per Tube BID  . rocuronium  100 mg Intravenous Once    Infusions: . sodium chloride 10 mL/hr at 06/16/17 0506  . furosemide (LASIX) infusion 8 mg/hr (06/17/17 2000)    PRN Medications: acetaminophen, fentaNYL (SUBLIMAZE) injection, ondansetron (ZOFRAN) IV, senna-docusate    Patient Profile   [name] is a 71 year old with history of chronic diastolic heart failure complicated by RV failure, CKD Stage III, symptomatic bradycardia, S/P PPM, ICH 2015, PE 2015, OSA, DM  CAD nonobstructive LHC 2006, morbid obesity, and blindness.   Admitted with acute respiratory failure.  Intubated in the ED.   Assessment/Plan   1. Acute hypoxemic respiratory failure:  Initially on Bipap but later intubated => extubated 11/1  - Volume status improving briskly but he is now back on BIPAP and CXR looks must worse and sodium climbing - I think infiltrates on CXR are far out of proportion to volume status. ? Component of infection or pneumonitis (? Amiodarone toxicity). - Stop amio - Check ESR. May need non-contrast chest CT  - I think he needs  steroids Will d/w P/CCM - They favor HF but agree on stopping amio and trial of steroids.  Will start solumedrol 60q6 - Will continue diuresis on more day. Set up CVP line.   2. Acute on chronic diastolic CHF: Echo (13/08) with EF 60-65%, moderate AS.  Now with central line, CVP 8 this morning but still pulmonary edema on CXR.  Diuresed on Lasix gtt + metolazone yesterday, good UOP and weight down. Looks like he is pretty dry but CXR much worse - As above ? Infection or pneumonitis (amiodarone) - Continue Lasix gtt at 8 mg/hr + metolazone 2.5 mg x 1 again today.   - Watch renal  funation closely 3. AKI on CKD Stage III: Creatinine Stable today  Watch closely with diuresis.   4. H/O PE 2015: Venous dopplers negative this admission.  5. ID: WBCs higher today but afebrile. ?Viral PNA or pneumonitis - Will switch to levaquin to get atypical coverage as well.  6. H/O Symptomatic Bradycardia: PPM 7. H/O ICH 2015: Not anticoagulated.    8. LLE Cellulitis: Continue ABX (cephalexin).  9. HTN:  Continue current meds.   10. Atrial fibrillation: Paroxysmal.  Not anticoagulated given history of intracerebral hemorrhage.  - NSR on telemetry.  Stopping amio with ? toxicity 11. Aortic stenosis: Moderate on Echo 06/14/17.   Length of Stay: 5  Glori Bickers, MD  06/18/2017, 10:36 AM  Advanced Heart Failure Team Pager [phone] (M-F; 7a - 4p)  Please contact [facility] for night-coverage after hours (4p -7a ) and weekends on [email or web address]

## 2017-06-18 NOTE — Progress Notes (Signed)
Called to bedside to evaluate for desaturation on CPAP CXR and ABG completed and reviewed.  Switched to NIPPV  20/10 with FiO2 Saturations improved   Dr. Seward Carol [facility]ums [facility]  06/18/2017 6:31 AM

## 2017-06-18 NOTE — Progress Notes (Signed)
Pt sats dropped to 84 while on CPAP, placed pt on 6L [place] then had to place on NRB.  Rapid response at bedside.  PCXR and ABG obtained. CCM called. Will continue to monitor. Saunders Revel T

## 2017-06-18 NOTE — Progress Notes (Signed)
RT stopped me regarding some "belly breathing", pt repositioned in bed, crackles noted to base of lungs. RR 22, 92% NRB, pt sleeping but able to arouse with verbal stimuli, oriented x3, skin warm and dry. CXR showed worsening pulmonary edema, unchanged cardiomegaly and pleural effusions. ABG 7.4/52/63.3/34.9 [place] MD to bedside, paged PTA RRT

## 2017-06-18 NOTE — Progress Notes (Signed)
RT took patient off bipap and placed on 6L HFNC. Vitals are stable and sats are 92%. RT will continue to monitor.

## 2017-06-18 NOTE — Progress Notes (Signed)
CCM to bedside RT to place on BIPAP.  Will continue to monitor. Saunders Revel T

## 2017-06-18 NOTE — Progress Notes (Signed)
Transfer Summary: Mr. Saraceni is a 71 y.o. Male with a PMHx significant for HFpEF, DM, OSA, and A-fib who was brought by EMS to the ED on 10/29 with acute hypoxic respiratory failure. He was initially placed on CPAP but failed to improve. Then transitioned to BiPAP but continued to be hypoxic with oxygen saturation in the 80% on FiO2 of 100%. CXR was significant for bilateral pulmonary edema and pleural effusions. The patient was subsequently intubated and taken to the critical care unit for management of his acute hypoxic respiratory failure 2/2 acute heart failure exacerbation and associated cardiorenal syndrome.   He was managed with a lasix drip and extubated on 11/1. He was initially remained stable on a NRB; however, due to hypoxia he was transitioned back to BiPAP. ABG this AM was 7.44/52/63.3 with a bicarb of 33. CXR was read as worsening pulmonary edema with unchanged cardiomegaly and pleural effusions.  Subjective: We went to evaluate the patient and he was arousable but fell back asleep quickly. CXR and ABGs were reviewed. The patient did express that he was not in any pain and would like some water. Cardiology evaluated the patient this morning and plan to continue the lasix drip. There was also recommendation to start steroid therapy due to worsening CXR that cannot be explained strictly with cardiogenic pulmonary edema. His WBC continues to trend up. Creatinine is stable at 2.1.   Objective: Vital signs in last 24 hours: Vitals:   06/18/17 0510 06/18/17 0511 06/18/17 0514 06/18/17 0727  BP:      Pulse: 87 87 84 85  Resp: (!) 22 (!) 22 20 16   Temp:      TempSrc:      SpO2: (!) 74% 93% 93% 97%  Weight:       General: Morbidly obese male, arousable but falls back asleep quickly  Pulm: Difficult to auscultate due to BiPAP but unable to appreciate and crackles or wheezing  CV: Distant heart sounds, RRR Abdomen: Active bowel sounds, obese, soft with no tenderness to palpation    Extremities: Lichenification and hyperpigmentation of the LE bilaterally, trace LE edema, bandage on the left LE that is dry, extremities are warm and dry   Assessment/Plan:  1. Acute Hypoxic Respiratory Failure  - Initially felt to be 2/2 acute heart failure exacerbation - Viral respiratory panel negative on admission  - Cardiology is diuresing with a lasix drip plus metolazone  - Repeat ABG 7.46/50.7/79.8, pO2:FiO2 = 114 - Worsening pulmonary edema on CXR, do not feel this is strictly related to cardiogenic pulmonary edema as the patient appears euvolemic, CVP 8, and has diuresed well during his hospitalization. - Leukocytosis trending up - Started on Solu-medrol 60 mg every 6 hours per PCCM  - Started on Levofloxacin 750 mg every 48 hours (due to renal function), if patient does not improve consider broadening antibiotic coverage to cover for VAP - Will need further imaging such as CT chest if fails to improve  - Amiodarone stopped  - Consistent with ARDS, would need intubation if things do not improve - Checking procalcitonin   2. Acute on Chronic Heart Failure  - Cardiology following, managing with lasix drip plus metolazone  - Discontinued Amiodarone  - Creatinine stable   3. AKI on CKD Stage III  - Creatinine 2.12 today  - Continue to monitor in the setting of aggressive diureses   4. Left Lower Extremity Cellulitis  - Bandage is dry  - PE consistent with LE stasis dermatitis  -  Previously on Keflex, discontinued today   5. Paroxysmal Atrial Fibrillation  - Previously rhythm controlled with Amiodarone  - Discontinued Amiodarone today  - Not on anticoagulation due to history of GI bleed  6. Diabetes Mellitus  - Currently NPO due to BiPAP use  - On Lantus 60 units QHS and Resistant SSI  7. Hypertension  - Continuing Amlodipine and Hydralazine   Dispo: Anticipated discharge pending clinical improvement.   Ina Homes, MD 06/18/2017, 11:46 AM My Pager:  [phone]

## 2017-06-18 NOTE — Progress Notes (Signed)
Assessment: Paged by Nurse at 2236 hrs concerning continual slow bleed from left IJ catheter site s/p removal ?2 days prior?. Nurse stated that she has changed the mild compression dressing twice in 4 hours given rate of bleed. Most recent PT, aPTT, and INR from 06/15/2017 WNL's. CBC, Hgb this am 12.9 and stable. Note from PCCM note the prior day indicating slow bleed from the site whereupon they held the heparin for one dose, no indication if this resolved the bleed. Patient noted to be on BIPAP during the evaluation. Nurse stated that his O2 sats dropped. Tolerating BIPAP as well as to be expected. Mildly hypertensive to 170's/90's, not to PRN hydralazine range as of the evaluation. Patient appears hemodynamically stable at this time.  Last dose of heparin given 11/3 @ 2210 a per MAR.   Observed slow bleed from the left IJ central catheter site s/p removal. Bleeding slows with mild compressive force. Replaced compression bandage.  Plan: -Continue current compression dressing. -Call MD if bleeding worsens or patients vitals change, patient decompensates. -Plan to hold Heparin for 06/19/2017 first (morning) dose. -Repeat coagulation labs with am labs. -Will reasess need for continued heparin if bleed persists.

## 2017-06-18 NOTE — Progress Notes (Signed)
PULMONARY / CRITICAL CARE MEDICINE   Name: [name] MRN: [id number] DOB: [birth year]    ADMISSION DATE:  06/13/2017 CONSULTATION DATE:  06/13/17  REFERRING MD:  Dr. Vanita Panda / EDP   CHIEF COMPLAINT:  Shortness of Breath   HISTORY OF PRESENT ILLNESS:  71 y/o M who presented to [facility] on 10/29 with reports of acute shortness of breath.    The patient is on BiPAP.  Information obtained from patient (as able), chart review and family at bedside.  The patient lives at home with the assistance of home health aides.  They are in/out of his home frequently during the day.  They assist him with all aspects of care.  In addition, [facility] 2-[facility] per [facility].  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 Family reports that some of the aides are good about keeping him to a low sodium diet, others are not.  They are unsure if he actually follows his fluid restriction.  Daughter thinks he was recently taken off a "blood thinner" but she is unsure (none listed on home medication list).  He does carry a hx of PE and prior anticoagulation. Family (sister, daughter and brother in law deny knowledge of him ever smoking). They state he has been on the vent before but are unsure if he would want it again.   He was recently seen in the ER on 10/23 for dizziness and vomiting that resolved with meclizine and zofran. The patient denies known sick contacts. EMS was called for acute shortness of breath.  He was placed on CPAP and transported to the ER.  In the ER, he was found to be hypoxic requiring BiPAP 16/8 with 100% FiO2 (he dropped to the 80's on 80% O2 per RT).  Initial CXR shows concern for pulmonary edema with pleural effusion.  Initial labs- Na 135, K 5.1, CL 90, CO2 30, BUN 83, CR 3.30 (up from 2.49 on 10/23, baseline 2.1-2.6 in the last year), glucose 166, AST 78 / ALT 97, T.Bili 1.8, Troponin <0.03, WBC 18.2, Hgb 14.3 and platelets 192.  He is unable to describe further symptoms  due to BiPAP mask.   He is followed by the CHF clinic and was most recently seen on 03/23/17 by Oda Kilts, PA-C.  At that time his office weight was 339 lbs.  In 12/2016 he was 329lbs.  On 10/23 in the ER he was 340 lbs.    SUBJECTIVE:  Desaturations over night 11/3, worsening pulmonary edema per CXR>> started on BiPAP and lasix gtt  VITAL SIGNS: BP (!) 113/52   Pulse 85   Temp 99.4 F (37.4 C) (Axillary)   Resp 16   Wt (!) 317 lb 14.5 oz (144.2 kg)   SpO2 97%   BMI 43.12 kg/m   HEMODYNAMICS: CVP:  [8 mmHg] 8 mmHg  VENTILATOR SETTINGS: FiO2 (%):  [70 %] 70 %  INTAKE / OUTPUT: I/O last 3 completed shifts: In: 1251.7 [P.O.:570; I.V.:481.7; IV Piggyback:200] Out: 5625 [Urine:5625]  PHYSICAL EXAMINATION: General: chronically ill appearing obese male on BiPAP, asleep HEENT: [place]/AT, PERRL, EOM-I and MMM, BiPAP full mask Neuro: Alert and interactive, moving all ext to command, appropriate CV: RRR, Nl S1/S2, -M/R/G. PULM: Bibasilar crackles, RR 27 GI: Soft, NT, ND and +BS, obese Extremities: Erythema, cellulitis L leg Skin: Intact, warm and dry  LABS:  BMET  Recent Labs Lab 06/16/17 0328 06/17/17 0500 06/18/17 0343  NA 140 145  146*  K 3.6 3.7 3.8  CL 99* 100* 101  CO2 31 35* 33*  BUN 86* 72* 60*  CREATININE 2.19* 2.15* 2.12*  GLUCOSE 230* 121* 145*   Electrolytes  Recent Labs Lab 06/16/17 0328 06/17/17 0500 06/18/17 0343  CALCIUM 8.7* 8.9 9.1  MG 2.0 2.0 2.1  PHOS 3.4 4.1 3.9   CBC  Recent Labs Lab 06/16/17 0328 06/17/17 0500 06/18/17 0343  WBC 13.7* 19.3* 20.5*  HGB 12.8* 12.7* 12.9*  HCT 41.9 41.7 43.1  PLT 162 181 187   Coag's  Recent Labs Lab 06/15/17 2207  APTT 31  INR 1.20    Sepsis Markers No results for input(s): LATICACIDVEN, PROCALCITON, O2SATVEN in the last 168 hours.  ABG  Recent Labs Lab 06/16/17 0415 06/17/17 0631 06/18/17 0440  PHART 7.454* 7.474* 7.442  PCO2ART 46.4 54.0* 52.0*  PO2ART 76.8* 87.0 63.3*     Liver Enzymes  Recent Labs Lab 06/13/17 0806 06/14/17 0145  AST 78* 47*  ALT 97* 84*  ALKPHOS 104 98  BILITOT 1.8* 1.1  ALBUMIN 3.2* 2.5*    Cardiac Enzymes  Recent Labs Lab 06/13/17 1504 06/13/17 1923 06/14/17 0145  TROPONINI <0.03 <0.03 <0.03    Glucose  Recent Labs Lab 06/17/17 0812 06/17/17 1120 06/17/17 1542 06/17/17 1951 06/17/17 2334 06/18/17 0318  GLUCAP 136* 173* 191* 164* 147* 141*    Imaging Dg Chest Port 1 View  Result Date: 06/18/2017 CLINICAL DATA:  Respiratory distress, desaturation. EXAM: PORTABLE CHEST 1 VIEW COMPARISON:  Radiographs yesterday at 0506 hour FINDINGS: Pacemaker remains in place. Cardiomegaly is unchanged. Worsening pulmonary edema from prior exam. Hazy lung base opacities consistent with pleural effusions. No pneumothorax. IMPRESSION: Worsening pulmonary edema. Unchanged cardiomegaly and pleural effusions. Electronically Signed   By: Jeb Levering M.D.   On: 06/18/2017 05:23    STUDIES:  ECHO 10/29 >> EF 60-65%, IVC dilated. BLE Venous Duplex 10/29 >> neg PCXR 11/1 > edema  CULTURES: RVP 10/29 >> neg  ANTIBIOTICS: Keflex 10/30 >   SIGNIFICANT EVENTS: 10/29  Admit with acute SOB  LINES/TUBES: ETT 10/29 > 10/31 >10/31>11/1 L IJ CVL 10/31 >   I reviewed CXR myself, pulmonary edema persistent  DISCUSSION: 70 y/o M with a complex medical history admitted 10/29 with acute onset SOB.  Suspect decompensated diastolic CHF but viral illness, PE also on differential (given high O2 needs).    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 P:   Titrate O2 for sat of 88-92% ABG prn IS per RT protocol Continue lasix gtt, goal neg balance BIPAP continuous for now>>  NPO SED rate  Start High dose steroids 60 mg Q 6 Consider HRCT if sed rate elevated, and when patient can tolerate lying flat  CARDIOVASCULAR A:  Diastolic CHF with concern for Acute Decompensation Hx HTN, HLD, CAD, PAF (not on anticoagulation due to rectal bleeding), bradycardia s/p pacemaker  P:  CHF Service consulted, appreciate input  DC Amiodarone  Continue lasix gtt, goal neg per CHF service Continue preadmission amio, amlodipine, aspirin, atorvastatin, hydralazine  RENAL A:   AKI on CKD - baseline sr cr 2.1-2.6  Hypokalemia - resolved Metabolic Alkalosis with compensated respiratory acidosis P:   Replace electrolytes as indicated Avoid nephrotoxic agents, ensure adequate renal perfusion  BMP 11/3 pm, and 11/4 am to recheck K  GASTROINTESTINAL A:   Morbid Obesity  Mild Elevation of LFT's - 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  P:   Diet as ordered NPO while on BIPAP  HEMATOLOGIC A:   DVT prophylaxis Hx of PE - LE duplex from 10/30 neg P:  SCD on right leg (no left leg given cellulitis) Heparin subcutaneous, hold next dose given bleeding from central line then restart Trend CBC   INFECTIOUS A:   Possible LLE cellulitis Rule Out Viral Illness, PNA - RVP neg 10/29 Leulkocytosis P:   Continue keflex (started 10/30, stop 11/5 for 7 days total) Trend fever curve and WBC Follow micro Culture as is clinically indicated  ENDOCRINE A:   DM - with CKD, resultant blindness    P:   Hold home oral agents SSI Q4 with resistant scale  NEUROLOGIC A:   Acute Metabolic Encephalopathy  P:   D/C all sedation Neuro checks per unit routine  FAMILY  Updates:No family at bedside. Care per [facility] as primary.  Magdalen Spatz, AGACNP-BC [place]. 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  On exam, diffuse crackles.  I reviewed CXR  myself, pulmonary edema and pleural effusion.  Discussed with PCCM-NP and resident.  Acute on chronic respiratory failure: pulmonary edema and concern for amiodarone lung  - BiPAP  - Active diureses as ordered.  Hypoxemia:  - Titrate O2 for sat of 88-92%  CKD:  - Lasix  - Diamox  - BMET in AM  Metabolic alkalosis  - Diamox as ordered  Pulmonary "infiltrate" likely edema, but concern for amiodarone lung  - CT of the chest (HRCT) when more stable.  PCCM will continue to follow  If deteriorates then will transfer to the ICU for intubation.  Patient seen and examined, agree with above note.  I dictated the care and orders written for this patient under my direction.  Rush Farmer, [place]

## 2017-06-19 ENCOUNTER — Inpatient Hospital Stay (HOSPITAL_COMMUNITY): Payer: Medicare (Managed Care)

## 2017-06-19 DIAGNOSIS — I480 Paroxysmal atrial fibrillation: Secondary | ICD-10-CM

## 2017-06-19 DIAGNOSIS — T462X5A Adverse effect of other antidysrhythmic drugs, initial encounter: Secondary | ICD-10-CM

## 2017-06-19 DIAGNOSIS — J984 Other disorders of lung: Secondary | ICD-10-CM

## 2017-06-19 LAB — GLUCOSE, CAPILLARY
GLUCOSE-CAPILLARY: 364 mg/dL — AB (ref 65–99)
GLUCOSE-CAPILLARY: 304 mg/dL — AB (ref 65–99)
Glucose-Capillary: 432 mg/dL — ABNORMAL HIGH (ref 65–99)
Glucose-Capillary: 377 mg/dL — ABNORMAL HIGH (ref 65–99)
Glucose-Capillary: 365 mg/dL — ABNORMAL HIGH (ref 65–99)
Glucose-Capillary: 318 mg/dL — ABNORMAL HIGH (ref 65–99)

## 2017-06-19 LAB — CBC
HEMATOCRIT: 42.4 % (ref 39.0–52.0)
HEMOGLOBIN: 12.8 g/dL — AB (ref 13.0–17.0)
MCH: 26 pg (ref 26.0–34.0)
MCHC: 30.2 g/dL (ref 30.0–36.0)
MCV: 86.2 fL (ref 78.0–100.0)
Platelets: 185 10*3/uL (ref 150–400)
RBC: 4.92 MIL/uL (ref 4.22–5.81)
RDW: 16.3 % — AB (ref 11.5–15.5)
WBC: 17.8 10*3/uL — AB (ref 4.0–10.5)

## 2017-06-19 LAB — BASIC METABOLIC PANEL
Anion gap: 13 (ref 5–15)
BUN: 60 mg/dL — ABNORMAL HIGH (ref 6–20)
CHLORIDE: 96 mmol/L — AB (ref 101–111)
CO2: 30 mmol/L (ref 22–32)
Calcium: 9.1 mg/dL (ref 8.9–10.3)
Creatinine, Ser: 2.2 mg/dL — ABNORMAL HIGH (ref 0.61–1.24)
GFR calc non Af Amer: 29 mL/min — ABNORMAL LOW (ref >60)
GFR, EST AFRICAN AMERICAN: 33 mL/min — AB (ref >60)
Glucose, Bld: 335 mg/dL — ABNORMAL HIGH (ref 65–99)
POTASSIUM: 3.5 mmol/L (ref 3.5–5.1)
SODIUM: 139 mmol/L (ref 135–145)

## 2017-06-19 LAB — PROCALCITONIN: PROCALCITONIN: 0.3 ng/mL

## 2017-06-19 LAB — APTT: APTT: 30 s (ref 24–36)

## 2017-06-19 LAB — PROTIME-INR
INR: 1.28
PROTHROMBIN TIME: 15.9 s — AB (ref 11.4–15.2)

## 2017-06-19 MED ORDER — POTASSIUM CHLORIDE 20 MEQ PO PACK: 40.0000 meq | PACK | Freq: Two times a day (BID) | ORAL | Status: DC

## 2017-06-19 MED ORDER — POTASSIUM CHLORIDE 20 MEQ PO PACK
40.0000 meq | PACK | Freq: Two times a day (BID) | ORAL | Status: DC
  Administered 2017-06-19 – 2017-06-24 (×11): 40 meq via ORAL
  Filled 2017-06-19 (×12): qty 2

## 2017-06-19 NOTE — Progress Notes (Signed)
Pt off bipap and on [place]

## 2017-06-19 NOTE — Progress Notes (Signed)
PULMONARY / CRITICAL CARE MEDICINE   Name: [name] MRN: [id number] DOB: 1946/07/14    ADMISSION DATE:  06/13/2017 CONSULTATION DATE:  06/13/17  REFERRING MD:  Dr. Vanita Panda / EDP   CHIEF COMPLAINT:  Shortness of Breath   HISTORY OF PRESENT ILLNESS:  71 y/o M who presented to [facility] on 10/29 with reports of acute shortness of breath.    The patient is on BiPAP.  Information obtained from patient (as able), chart review and family at bedside.  The patient lives at home with the assistance of home health aides.  They are in/out of his home frequently during the day.  They assist him with all aspects of care.  In addition, [facility] 2-[facility] per [facility].  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 Family reports that some of the aides are good about keeping him to a low sodium diet, others are not.  They are unsure if he actually follows his fluid restriction.  Daughter thinks he was recently taken off a "blood thinner" but she is unsure (none listed on home medication list).  He does carry a hx of PE and prior anticoagulation. Family (sister, daughter and brother in law deny knowledge of him ever smoking). They state he has been on the vent before but are unsure if he would want it again.   He was recently seen in the ER on 10/23 for dizziness and vomiting that resolved with meclizine and zofran. The patient denies known sick contacts. EMS was called for acute shortness of breath.  He was placed on CPAP and transported to the ER.  In the ER, he was found to be hypoxic requiring BiPAP 16/8 with 100% FiO2 (he dropped to the 80's on 80% O2 per RT).  Initial CXR shows concern for pulmonary edema with pleural effusion.  Initial labs- Na 135, K 5.1, CL 90, CO2 30, BUN 83, CR 3.30 (up from 2.49 on 10/23, baseline 2.1-2.6 in the last year), glucose 166, AST 78 / ALT 97, T.Bili 1.8, Troponin <0.03, WBC 18.2, Hgb 14.3 and platelets 192.  He is unable to describe further symptoms  due to BiPAP mask.   He is followed by the CHF clinic and was most recently seen on 03/23/17 by Oda Kilts, PA-C.  At that time his office weight was 339 lbs.  In 12/2016 he was 329lbs.  On 10/23 in the ER he was 340 lbs.    SUBJECTIVE:  States breathing is better on BiPAP after diuresis and initiation of steroid. Some oozing from [place] site.  VITAL SIGNS: BP (!) 159/102   Pulse 73   Temp 98.6 F (37 C) (Axillary)   Resp 19   Wt (!) 317 lb 14.5 oz (144.2 kg)   SpO2 92%   BMI 43.12 kg/m   HEMODYNAMICS:    VENTILATOR SETTINGS: FiO2 (%):  [50 %-60 %] 50 %  INTAKE / OUTPUT: I/O last 3 completed shifts: In: 1206.7 [P.O.:500; I.V.:556.7; IV Piggyback:150] Out: 0240 [Urine:3775]  PHYSICAL EXAMINATION: General: chronically ill appearing obese male on BiPAP, asleep but arouses easily to call of name. HEENT: [place]/AT, PERRL, EOM-I and MMM, BiPAP full mask. Dressing to L IJ site. Neuro: Alert and interactive, moving all ext to command, appropriate CV: RRR, S1/S2, -M/R/G. PULM: Bibasilar crackles, RR 18, NAD GI: Soft, NT, ND and +BS, obese Extremities: Erythema, cellulitis L leg Skin: Intact, warm and dry, no rash or lesion noted.  LABS:  BMET Recent Labs  Lab 06/18/17 0343 06/18/17 1342 06/19/17 0247  NA 146* 144 139  K 3.8 4.2 3.5  CL 101 101 96*  CO2 33* 33* 30  BUN 60* 60* 60*  CREATININE 2.12* 2.05* 2.20*  GLUCOSE 145* 178* 335*   Electrolytes Recent Labs  Lab 06/16/17 0328 06/17/17 0500 06/18/17 0343 06/18/17 1342 06/19/17 0247  CALCIUM 8.7* 8.9 9.1 9.1 9.1  MG 2.0 2.0 2.1  --   --   PHOS 3.4 4.1 3.9  --   --    CBC Recent Labs  Lab 06/17/17 0500 06/18/17 0343 06/19/17 0247  WBC 19.3* 20.5* 17.8*  HGB 12.7* 12.9* 12.8*  HCT 41.7 43.1 42.4  PLT 181 187 185   Coag's Recent Labs  Lab 06/15/17 2207 06/19/17 0247  APTT 31 30  INR 1.20 1.28    Sepsis Markers Recent Labs  Lab 06/18/17 1342 06/19/17 0247  PROCALCITON 0.40 0.30     ABG Recent Labs  Lab 06/17/17 0631 06/18/17 0440 06/18/17 1125  PHART 7.474* 7.442 7.463*  PCO2ART 54.0* 52.0* 50.7*  PO2ART 87.0 63.3* 79.8*    Liver Enzymes Recent Labs  Lab 06/13/17 0806 06/14/17 0145  AST 78* 47*  ALT 97* 84*  ALKPHOS 104 98  BILITOT 1.8* 1.1  ALBUMIN 3.2* 2.5*    Cardiac Enzymes Recent Labs  Lab 06/13/17 1504 06/13/17 1923 06/14/17 0145  TROPONINI <0.03 <0.03 <0.03    Glucose Recent Labs  Lab 06/17/17 2334 06/18/17 0318 06/18/17 0944 06/18/17 1153 06/18/17 1616 06/18/17 2208  GLUCAP 147* 141* 171* 150* 228* 312*    Imaging No results found.  STUDIES:  ECHO 10/29 >> EF 60-65%, IVC dilated. BLE Venous Duplex 10/29 >> neg PCXR 11/1 > edema CXR 11/3>>Worsening pulmonary edema. Unchanged cardiomegaly and pleural effusions CXR 11/4>>  CULTURES: RVP 10/29 >> neg  ANTIBIOTICS: Keflex 10/30 > Levaquin 11/3>>   SIGNIFICANT EVENTS: 10/29  Admit with acute SOB  LINES/TUBES: ETT 10/29 > 10/31 >10/31>11/1 L IJ CVL 10/31 >     DISCUSSION: 71 y/o M with a complex medical history admitted 10/29 with acute onset SOB.  Suspect decompensated diastolic CHF but viral illness, PE also on differential (given high O2 needs).    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 P:   Titrate O2 for sat of 88-92% ABG prn IS per RT protocol Continue lasix gtt, goal neg balance>> per CHF team and as renal function allows BIPAP continuous for now>> NPO SED rate 100 >> drawn prior to steroid initiation>> concern for amiodarone toxicity Continue  High dose steroids 60 mg Q 6 HRCT  when patient can tolerate lying flat  CARDIOVASCULAR A:  Diastolic CHF with  concern for Acute Decompensation Hx HTN, HLD, CAD, PAF (not on anticoagulation due to rectal bleeding), bradycardia s/p pacemaker  P:  CHF Service consulted, appreciate input  DC Amiodarone  Continue lasix gtt, goal neg per CHF service as renal function allows. Continue preadmission amio, amlodipine, aspirin, atorvastatin, hydralazine Strict I&O  RENAL A:   AKI on CKD - baseline sr cr 2.1-2.6  Hypokalemia risk with lasix gtt Metabolic Alkalosis with compensated respiratory acidosis Slight bump in creatinine 11/4 P:   Replace electrolytes as indicated(  K+ repleted 11/4 Avoid nephrotoxic agents, ensure adequate renal perfusion  Lasix gtt Consider additional Diamox BMP daily Strict I&O  GASTROINTESTINAL A:   Morbid Obesity  Mild Elevation of LFT's - 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  P:   NPO while on BIPAP  HEMATOLOGIC A:   DVT prophylaxis Hx of PE - LE duplex from 10/30 neg Oozing from L IJ site>> pressure dressing Coags 11/4>> PT 15.9;  INR 1.28;  APTT 30 P:  SCD on right leg (no left leg given cellulitis) Heparin subcutaneous, hold next dose given oozing  from central line then restart Trend CBC daily Transfuse for HGB < 7 Monitor coags prn  INFECTIOUS A:   Possible LLE cellulitis Rule Out Viral Illness, PNA - RVP neg 10/29 Leukocytosis PCT 11/3>> 0.30 T Max 99.4 last 24 P:   Levaquin initiated 11/3 Trend fever curve and WBC Follow micro Culture as is clinically indicated  ENDOCRINE A:   DM - with CKD, resultant blindness    P:   Hold home oral agents SSI Q4 with resistant scale  NEUROLOGIC A:   Acute Metabolic Encephalopathy  P:   D/C all sedation Neuro checks per unit routine  FAMILY  Updates:No family at bedside. Care per [facility] as primary.  Magdalen Spatz, AGACNP-BC [place]. Pager: [phone] 06/19/2017 0830  Attending Note:  71 year old male with CHF, respiratory failure, pulmonary edema and concern  for amiodarone toxicity.  On exam, crackles persist.  I reviewed CXR myself, some but not major improvement in pulmonary edema.  Discussed with PCCM-NP.  Acute on chronic respiratory failure: pulmonary edema for amiodarone lung.  - BiPAP  - Active diureses as ordered.  Hypoxemia:  HFNC  - Titrate O2 for sat of 88-92%.  CKD:  - Lasix  - Diamox  - BMET in AM  Metabolic alkalosis:  - Diamox as ordered  Pulmonary "infiltrate" likely edema, but concern for amiodarone lung  - HRCT when more stable  PCCM will continue to follow  Hold in SDU  Patient seen and examined, agree with above note.  I dictated the care and orders written for this patient under my direction.  Rush Farmer, [place]

## 2017-06-19 NOTE — Progress Notes (Signed)
Internal Medicine Attending:   I saw and examined the patient. I reviewed the resident's note and I agree with the resident's findings and plan as documented in the resident's note.  Patient feels better this morning and is now off BiPAP.  No new complaints at this time.  The etiology was acute hypoxic respiratory failure remains uncertain at this time.  It is possibly secondary to fluid overload versus amiodarone toxicity versus ARDS versus pneumonia given bilateral extensive infiltrates on chest x-ray which worsened despite diuresis with Lasix drip.  Would consider getting a high-resolution CT once patient able to tolerate lying flat.  Continue with diuresis with Lasix drip for possible fluid overload per cardiology.  Patient required BiPAP overnight but is now tolerating being off BiPAP and able to eat.  Would also continue with Solu-Medrol 60 mg every 6 hours per PCCM for possible amiodarone toxicity.  Antibiotics were switched from cephalexin to levofloxacin yesterday to cover for possible underlying pneumonia in addition to his left lower extremity cellulitis.  White count mildly improved today to 17.  We will continue to monitor closely in stepdown unit.

## 2017-06-19 NOTE — Progress Notes (Signed)
Patient ID: [name], male   DOB: 04-28-1946, 71 y.o.   MRN: [id number]     Advanced Heart Failure Rounding Note  Primary Cardiologist: Dr. Aundra Fletcher   Subjective:    Admitted with acute respiratory failure requiring intubation.   Intubated initially, extubated on 11/1.  Yesterday amiodarone stopped and started on steroids for presumed amio toxicity as pulmonary infiltrates getting worse despite diuresis. ESR 100. Feels better today. Remains on BIPAP. Denies dyspnea. Much more awake.  CXR with mildly improved infiltrates (viewed personally). Weight down another 10 pounds with diuresis on lasix gtt. (26 pounds total). Creatinine 2.05-> 2.20   Echo 06/14/17 LVEF 60-65%, Moderate AS  Objective:   Weight Range: (!) 144.2 kg (317 lb 14.5 oz) Body mass index is 43.12 kg/m.   Vital Signs:   Temp:  [97.6 F (36.4 C)-98.6 F (37 C)] 97.6 F (36.4 C) (11/04 1212) Pulse Rate:  [73-97] 79 (11/04 1212) Resp:  [15-35] 22 (11/04 1212) BP: (132-167)/(79-127) 132/79 (11/04 1212) SpO2:  [87 %-97 %] 95 % (11/04 1217) FiO2 (%):  [50 %-60 %] 50 % (11/04 1217) Last BM Date: 06/16/17 Weight change: Filed Weights   06/16/17 0452 06/17/17 0437 06/18/17 0300  Weight: (!) 150.9 kg (332 lb 10.8 oz) (!) 148.4 kg (327 lb 2.6 oz) (!) 144.2 kg (317 lb 14.5 oz)   Intake/Output:   Intake/Output Summary (Last 24 hours) at 06/19/2017 1357 Last data filed at 06/19/2017 1200 Gross per 24 hour  Intake 1188.54 ml  Output 4750 ml  Net -3561.46 ml    Physical Exam    General:  Obese male lying flat in bed on biap. Much more awake HEENT: normal x for bipap mask Neck: supple. Unable to assess jvp due to size  Carotids 2+ bilat; no bruits. No lymphadenopathy or thryomegaly appreciated. Cor: PMI non palpable . Regular rate & rhythm. No rubs, gallops or murmurs. Lungs: clear anteriorly with decreased breath sounds  Abdomen: markedly obese. Non-tender  Unable to assess hepatosplenomegaly. No bruits or  masses. Good bowel sounds. Extremities: no cyanosis, clubbing, rash, edema + folye  Neuro: alert & orientedx3, cranial nerves grossly intact. moves all 4 extremities w/o difficulty. Affect pleasant    Telemetry   NSR in 70-80s, personally reviewed.   EKG    N/A  Labs    CBC Recent Labs    06/18/17 0343 06/19/17 0247  WBC 20.5* 17.8*  HGB 12.9* 12.8*  HCT 43.1 42.4  MCV 86.4 86.2  PLT 187 272   Basic Metabolic Panel Recent Labs    06/17/17 0500 06/18/17 0343 06/18/17 1342 06/19/17 0247  NA 145 146* 144 139  K 3.7 3.8 4.2 3.5  CL 100* 101 101 96*  CO2 35* 33* 33* 30  GLUCOSE 121* 145* 178* 335*  BUN 72* 60* 60* 60*  CREATININE 2.15* 2.12* 2.05* 2.20*  CALCIUM 8.9 9.1 9.1 9.1  MG 2.0 2.1  --   --   PHOS 4.1 3.9  --   --    Liver Function Tests No results for input(s): AST, ALT, ALKPHOS, BILITOT, PROT, ALBUMIN in the last 72 hours. No results for input(s): LIPASE, AMYLASE in the last 72 hours. Cardiac Enzymes No results for input(s): CKTOTAL, CKMB, CKMBINDEX, TROPONINI in the last 72 hours.  BNP: BNP (last 3 results) Recent Labs    02/09/17 1155 03/23/17 1113 06/13/17 0805  BNP 47.5 78.0 158.5*    ProBNP (last 3 results) No results for input(s): PROBNP in the last 8760 hours.  D-Dimer No results for input(s): DDIMER in the last 72 hours. Hemoglobin A1C No results for input(s): HGBA1C in the last 72 hours. Fasting Lipid Panel No results for input(s): CHOL, HDL, LDLCALC, TRIG, CHOLHDL, LDLDIRECT in the last 72 hours. Thyroid Function Tests No results for input(s): TSH, T4TOTAL, T3FREE, THYROIDAB in the last 72 hours.  Invalid input(s): FREET3  Other results:   Imaging    Dg Chest Port 1 View  Result Date: 06/19/2017 CLINICAL DATA:  71 year old male with respiratory failure. EXAM: PORTABLE CHEST 1 VIEW COMPARISON:  06/18/2017 FINDINGS: Right-sided pacer appears in stable position. The cardiomediastinal silhouette is enlarged, unchanged.  Diffuse patchy airspace opacities consistent with pulmonary edema, minimally improved from prior study. Likely small bilateral pleural effusions. No pneumothorax. No acute osseous abnormalities. IMPRESSION: Minimally improved pulmonary edema with continued cardiomegaly and small pleural effusions. Electronically Signed   By: [name] M.D.   On: 06/19/2017 10:03     Medications:     Scheduled Medications: . amLODipine  10 mg Oral Daily  . atorvastatin  40 mg Per Tube Daily  . cholecalciferol  1,000 Units Oral Daily  . famotidine  20 mg Per Tube Daily  . heparin  5,000 Units Subcutaneous Q8H  . insulin aspart  0-20 Units Subcutaneous TID WC  . insulin aspart  0-5 Units Subcutaneous QHS  . insulin glargine  60 Units Subcutaneous Daily  . mouth rinse  15 mL Mouth Rinse BID  . methylPREDNISolone (SOLU-MEDROL) injection  60 mg Intravenous Q6H  . metolazone  2.5 mg Oral Once  . potassium chloride  40 mEq Oral BID  . rocuronium  100 mg Intravenous Once    Infusions: . sodium chloride 10 mL/hr at 06/16/17 0506  . furosemide (LASIX) infusion 8 mg/hr (06/19/17 1200)  . levofloxacin (LEVAQUIN) IV Stopped (06/18/17 1624)    PRN Medications: acetaminophen, fentaNYL (SUBLIMAZE) injection, hydrALAZINE, ondansetron (ZOFRAN) IV, senna-docusate    Patient Profile   [name] is a 71 year old with history of chronic diastolic heart failure complicated by RV failure, CKD Stage III, symptomatic bradycardia, S/P PPM, ICH 2015, PE 2015, OSA, DM  CAD nonobstructive LHC 2006, morbid obesity, and blindness.   Admitted with acute respiratory failure.  Intubated in the ED.   Assessment/Plan   1. Acute hypoxemic respiratory failure:  Initially on Bipap but later intubated => extubated 11/1  - As stated yesterday, infiltrates on CXR are far out of proportion to volume status. ESR 100. Very suspicious for amio toxicity - Amio stopped 11/3. Now on high-dose steroids. Seems to be improving  slightly - Will need high-res chest CT. When more stable. Hopefully in am - Volume status very hard to assess but expect he is nearing euvolemia. Weight down 26 pounds. Urine still clear.  Will continue diuresis on more day. 2. Acute on chronic diastolic CHF: Echo (84/16) with EF 60-65%, moderate AS.  Now with central line, CVP 8 this morning but still pulmonary edema on CXR.  - As above. Hard to assess volume status. Weight down 26 pounds. Renal function stable.  - Will continue Lasix gtt at 8 mg/hr one more day  - Watch renal funation closely 3. AKI on CKD Stage III: Creatinine Stable at 2.2  today  Watch closely with diuresis.   4. H/O PE 2015: Venous dopplers negative this admission.  5. ID: ?Viral PNA or pneumonitis - Switched to Levaquin on 11/3  to get atypical coverage as well.  - WBC  falling after starting steroids.  PCT 0.30 - Suspect most likely amio toxicity and not infection but will complete 5 day course of levaquin. Today 2/5 6. H/O Symptomatic Bradycardia: PPM 7. H/O ICH 2015: Not anticoagulated.    8. LLE Cellulitis:  - Resolved. Continued abx 9. HTN:  - Blood pressure well controlled. Continue current regimen 10. Atrial fibrillation: Paroxysmal.   - Not anticoagulated given history of intracerebral hemorrhage.  - NSR on telemetry.  Stopping amio with ? toxicity 11. Aortic stenosis: Moderate on Echo 06/14/17.  - follow  Length of Stay: 6  Milisa Kimbell, MD  06/19/2017, 1:57 PM  Advanced Heart Failure Team Pager [phone] (M-F; 7a - 4p)  Please contact [name] for night-coverage after hours (4p -7a ) and weekends on [email or web address]

## 2017-06-19 NOTE — Progress Notes (Signed)
   Subjective: Patient is doing well this AM. He is off BiPAP and breathing easily. States he is ready to go home. Wants to walk but he needs a walker. Discussed trying to get him out of bed this AM. He is tolerating PO intake appropriately and not as drowsy this AM. All questions and concerns addressed.   Objective: Vital signs in last 24 hours: Vitals:   06/19/17 0132 06/19/17 0319 06/19/17 0409 06/19/17 0410  BP: (!) 167/127 (!) 151/120 (!) 159/102   Pulse:  74    Resp: (!) 24 20 17    Temp: 97.6 F (36.4 C)  98.6 F (37 C)   TempSrc: Oral  Axillary   SpO2: 96% 95%  (!) 87%  Weight:       General: Morbidly obese male in no acute distress Pulm: Difficult to auscultate due to body habitus but appears to have good air movement with no wheezing or crackles  CV: Diminished heart sounds, RRR, no murmur, no rub  Abdomen: Active bowel sounds, soft, no tenderness to palpation  Extremities: No LE edema, bandage on the left LE is clean and dry  Assessment/Plan:  1. Acute Hypoxic Respiratory Failure  - Initially felt to be 2/2 heart failure exacerbation/voulme overload. CXR continues to worsen, he has diuresed well over the hospitalization, and CVP of 8 yesterday. The patient is no longer volume overload.  - Most recent ABG 7.46/50.7/79.8, pO2:FiO2 = 114 - Elevated Sed rate and procalcitonin consistent with infection  - Continue Solu-medrol 60 mg every 6 hours per PCCM  - Continue Levofloxacin 750 mg every 48 hours  - He was able to come off BiPAP yesterday afternoon; however, yesterday evening he desaturated and was placed back on BiPAP. This AM he is off the BiPAP and tolerating PO intake. Will consider CT chest tomorrow if continues to be stable to rule out amiodarone toxicity.   2. Acute on Chronic Heart Failure  - Cardiology following, managing with lasix drip plus metolazone  - Discontinued Amiodarone on 11/3 due to amiodarone toxicity being on the differential   - Creatinine  stable  3. AKI on CKD Stage III  - Creatinine 2.20 today  - Continue to monitor in the setting of aggressive diureses   4. Left Lower Extremity Cellulitis  - Bandage is dry, continue dressing changes PRN - PE consistent with LE stasis dermatitis  - Adequate coverage with Levofloxacin    5. Paroxysmal Atrial Fibrillation  - Previously rhythm controlled with Amiodarone  - Discontinued Amiodarone today  - Not on anticoagulation due to history of GI bleed  6. Diabetes Mellitus  - On Lantus 60 units QHS and Resistant SSI  7. Hypertension  - Continuing Amlodipine - Will add back his Hydralazine if he continues to run high  Dispo: Anticipated discharge pending clinical improvement.   Ina Homes, MD 06/19/2017, 4:55 AM My Pager: [phone]

## 2017-06-20 ENCOUNTER — Inpatient Hospital Stay (HOSPITAL_COMMUNITY): Payer: Medicare (Managed Care)

## 2017-06-20 ENCOUNTER — Encounter (HOSPITAL_COMMUNITY): Payer: Self-pay | Admitting: Acute Care

## 2017-06-20 ENCOUNTER — Other Ambulatory Visit: Payer: Self-pay

## 2017-06-20 DIAGNOSIS — Z9289 Personal history of other medical treatment: Secondary | ICD-10-CM

## 2017-06-20 DIAGNOSIS — J984 Other disorders of lung: Secondary | ICD-10-CM

## 2017-06-20 DIAGNOSIS — T462X5A Adverse effect of other antidysrhythmic drugs, initial encounter: Secondary | ICD-10-CM

## 2017-06-20 LAB — BASIC METABOLIC PANEL
ANION GAP: 12 (ref 5–15)
BUN: 66 mg/dL — ABNORMAL HIGH (ref 6–20)
CHLORIDE: 96 mmol/L — AB (ref 101–111)
CO2: 30 mmol/L (ref 22–32)
Calcium: 9.2 mg/dL (ref 8.9–10.3)
Creatinine, Ser: 2.17 mg/dL — ABNORMAL HIGH (ref 0.61–1.24)
GFR calc Af Amer: 34 mL/min — ABNORMAL LOW (ref >60)
GFR calc non Af Amer: 29 mL/min — ABNORMAL LOW (ref >60)
Glucose, Bld: 341 mg/dL — ABNORMAL HIGH (ref 65–99)
Potassium: 3.7 mmol/L (ref 3.5–5.1)
Sodium: 138 mmol/L (ref 135–145)

## 2017-06-20 LAB — GLUCOSE, CAPILLARY
GLUCOSE-CAPILLARY: 376 mg/dL — AB (ref 65–99)
GLUCOSE-CAPILLARY: 343 mg/dL — AB (ref 65–99)
Glucose-Capillary: 310 mg/dL — ABNORMAL HIGH (ref 65–99)
Glucose-Capillary: 466 mg/dL — ABNORMAL HIGH (ref 65–99)

## 2017-06-20 LAB — CBC
HEMATOCRIT: 42.6 % (ref 39.0–52.0)
HEMOGLOBIN: 13.1 g/dL (ref 13.0–17.0)
MCH: 26.1 pg (ref 26.0–34.0)
MCHC: 30.8 g/dL (ref 30.0–36.0)
MCV: 84.9 fL (ref 78.0–100.0)
Platelets: 204 10*3/uL (ref 150–400)
RBC: 5.02 MIL/uL (ref 4.22–5.81)
RDW: 15.9 % — ABNORMAL HIGH (ref 11.5–15.5)
WBC: 18.5 10*3/uL — ABNORMAL HIGH (ref 4.0–10.5)

## 2017-06-20 LAB — PROCALCITONIN: Procalcitonin: 0.29 ng/mL

## 2017-06-20 MED ORDER — TORSEMIDE 20 MG PO TABS
100.0000 mg | ORAL_TABLET | Freq: Two times a day (BID) | ORAL | Status: DC
  Administered 2017-06-20 – 2017-06-24 (×9): 100 mg via ORAL
  Filled 2017-06-20 (×9): qty 5

## 2017-06-20 MED ORDER — HYDRALAZINE HCL 50 MG PO TABS
25.0000 mg | ORAL_TABLET | Freq: Three times a day (TID) | ORAL | Status: DC
  Administered 2017-06-20 – 2017-06-21 (×4): 25 mg via ORAL
  Filled 2017-06-20 (×4): qty 1

## 2017-06-20 MED ORDER — INSULIN ASPART 100 UNIT/ML ~~LOC~~ SOLN
10.0000 [IU] | Freq: Three times a day (TID) | SUBCUTANEOUS | Status: DC
  Administered 2017-06-20 – 2017-06-21 (×4): 10 [IU] via SUBCUTANEOUS

## 2017-06-20 MED ORDER — INSULIN GLARGINE 100 UNIT/ML ~~LOC~~ SOLN
60.0000 [IU] | Freq: Two times a day (BID) | SUBCUTANEOUS | Status: DC
  Administered 2017-06-20 – 2017-06-21 (×2): 60 [IU] via SUBCUTANEOUS
  Filled 2017-06-20 (×2): qty 0.6

## 2017-06-20 MED ORDER — PREMIER PROTEIN SHAKE
2.0000 [oz_av] | Freq: Two times a day (BID) | ORAL | Status: DC
  Administered 2017-06-21 – 2017-06-24 (×7): 2 [oz_av] via ORAL
  Filled 2017-06-20 (×12): qty 325.31

## 2017-06-20 NOTE — NC FL2 (Signed)
[place] LEVEL OF CARE SCREENING TOOL     IDENTIFICATION  Patient Name: [name] Birthdate: 10/05/1945 Sex: male Admission Date (Current Location): 06/13/2017  [facility] and [place] Number:  Herbalist and Address:  The . [facility], [place] [address], [place], [place] [address]      Provider Number: [id number]  Attending Physician Name and Address:  [name], MD  Relative Name and Phone Number:       Current Level of Care: Hospital Recommended Level of Care: [place] Prior Approval Number:    Date Approved/Denied:   PASRR Number: [id number]  Discharge Plan: SNF    Current Diagnoses: Patient Active Problem List   Diagnosis Date Noted  . Amiodarone pulmonary toxicity   . Cellulitis   . Acute encephalopathy   . Acute pulmonary edema (HCC)   . Acute respiratory failure with hypoxia ([place]) 06/13/2017  . SSS (sick sinus syndrome) ([place]) 05/19/2016  . Cough 08/20/2015  . Acute on chronic diastolic (congestive) heart failure ([place]) 07/28/2015  . Morbid obesity ([place]) 07/09/2015  . [place] thrombus of heart 10/08/2013  . Cardiac mass 10/04/2013  . CVA (cerebrovascular accident due to intracerebral hemorrhage) ([place]) 10/02/2013  . CVA (cerebral infarction) 10/01/2013  . Intracerebral hemorrhage ([place]) 10/01/2013  . Hypertension 10/01/2013  . ICH (intracerebral hemorrhage) ([place]) 10/01/2013  . Right knee pain 09/07/2013  . CKD (chronic kidney disease) stage 3, GFR 30-59 ml/min (HCC) 09/07/2013  . Orchitis 09/05/2013  . Unspecified protein-calorie malnutrition ([place]) 09/05/2013  . Sepsis ([place]) 08/27/2013  . Trichomonas infection 08/27/2013  . Epididymo-orchitis, acute 08/27/2013  . PAF (paroxysmal atrial fibrillation) ([place]) 06/24/2013  . OSA (obstructive sleep apnea) 05/04/2011  . Shortness of breath 03/17/2011  . Depression 10/23/2010  . Degenerative joint disease   . Cardiomyopathy, hypertensive ([place])    . Coronary artery disease involving native coronary artery of native heart without angina pectoris   . Pacemaker   . Incarcerated ventral hernia   . COPD (chronic obstructive pulmonary disease) ([place]) 11/28/2009  . Type 2 diabetes mellitus, uncontrolled ([place]) 07/20/2006  . Hyperlipidemia 07/20/2006  . GOUT 07/20/2006  . MACULAR DEGENERATION 07/20/2006  . Essential hypertension 07/20/2006  . BENIGN PROSTATIC HYPERTROPHY, HX OF 07/20/2006    Orientation RESPIRATION BLADDER Height & Weight     Self, Situation, Place  Other (Comment)(BiPAP, 40%) Incontinent, External catheter(catheter placed 06/13/17) Weight: (!) 317 lb 14.5 oz (144.2 kg) Height:     BEHAVIORAL SYMPTOMS/MOOD NEUROLOGICAL BOWEL NUTRITION STATUS      Incontinent Diet(low sodium, carb modified)  AMBULATORY STATUS COMMUNICATION OF NEEDS Skin   Extensive Assist Verbally Other (Comment)(Left leg venous stasis ulcer, foam dressing)                       Personal Care Assistance Level of Assistance  Bathing, Feeding, Dressing Bathing Assistance: Maximum assistance Feeding assistance: Limited assistance Dressing Assistance: Maximum assistance     Functional Limitations Info  Sight, Hearing, Speech Sight Info: Impaired(blind) Hearing Info: Adequate Speech Info: Adequate    SPECIAL CARE FACTORS FREQUENCY  PT (By licensed PT), OT (By licensed OT)     PT Frequency: 5x/wk OT Frequency: 5x/wk            Contractures Contractures Info: Not present    Additional Factors Info  Code Status, Allergies, Insulin Sliding Scale Code Status Info: Full Allergies Info: NKA   Insulin Sliding Scale Info: Insulin 0-20 units 3x/day with meals; 0-5  units daily at bedtime; 10 units 3x/day with meals; Lantus 60 units 2x/day       Current Medications (06/20/2017):  This is the current hospital active medication list Current Facility-Administered Medications  Medication Dose Route Frequency Provider Last Rate Last Dose  .  0.9 %  sodium chloride infusion   Intravenous Continuous Clegg, Amy D, NP 10 mL/hr at 06/16/17 0506    . acetaminophen (TYLENOL) tablet 650 mg  650 mg Per Tube Q6H PRN Noe Gens L, NP   650 mg at 06/16/17 2035  . amLODipine (NORVASC) tablet 10 mg  10 mg Oral Daily Ollis, Brandi L, NP   10 mg at 06/20/17 0933  . atorvastatin (LIPITOR) tablet 40 mg  40 mg Per Tube Daily Ollis, Brandi L, NP   40 mg at 06/20/17 0933  . cholecalciferol (VITAMIN D) tablet 1,000 Units  1,000 Units Oral Daily Noe Gens L, NP   1,000 Units at 06/20/17 0933  . famotidine (PEPCID) tablet 20 mg  20 mg Per Tube Daily Chesley Mires, MD   20 mg at 06/20/17 0933  . heparin injection 5,000 Units  5,000 Units Subcutaneous Q8H Ollis, Brandi L, NP   5,000 Units at 06/20/17 0534  . hydrALAZINE (APRESOLINE) injection 5 mg  5 mg Intravenous Q8H PRN Ina Homes, MD   5 mg at 06/19/17 0136  . hydrALAZINE (APRESOLINE) tablet 25 mg  25 mg Oral Q8H Larey Dresser, MD   25 mg at 06/20/17 0933  . insulin aspart (novoLOG) injection 0-20 Units  0-20 Units Subcutaneous TID WC Velna Ochs, MD   20 Units at 06/20/17 1306  . insulin aspart (novoLOG) injection 0-5 Units  0-5 Units Subcutaneous QHS Velna Ochs, MD   4 Units at 06/19/17 2221  . insulin aspart (novoLOG) injection 10 Units  10 Units Subcutaneous TID WC Ina Homes, MD   10 Units at 06/20/17 1307  . insulin glargine (LANTUS) injection 60 Units  60 Units Subcutaneous BID Helberg, Justin, MD      . levofloxacin (LEVAQUIN) IVPB 750 mg  750 mg Intravenous Q48H Aldine Contes, MD   Stopped at 06/20/17 1531  . MEDLINE mouth rinse  15 mL Mouth Rinse BID Rush Farmer, MD   15 mL at 06/20/17 0935  . methylPREDNISolone sodium succinate (SOLU-MEDROL) 125 mg/2 mL injection 60 mg  60 mg Intravenous Q6H Bensimhon, Shaune Pascal, MD   60 mg at 06/20/17 1308  . metolazone (ZAROXOLYN) tablet 2.5 mg  2.5 mg Oral Once Larey Dresser, MD      . ondansetron [facility]) injection 4  mg  4 mg Intravenous Q6H PRN Noe Gens L, NP   4 mg at 06/16/17 2043  . potassium chloride (KLOR-CON) packet 40 mEq  40 mEq Oral BID Einar Grad, RPH   40 mEq at 06/20/17 2952  . [START ON 06/21/2017] protein supplement (PREMIER PROTEIN) liquid  2 oz Oral BID BM [name], MD      . rocuronium [facility]) injection 100 mg  100 mg Intravenous Once Ollis, Brandi L, NP      . senna-docusate (Senokot-S) tablet 1 tablet  1 tablet Per Tube QHS PRN,MR X 1 Ollis, Brandi L, NP      . torsemide (DEMADEX) tablet 100 mg  100 mg Oral BID Larey Dresser, MD       Facility-Administered Medications Ordered in Other Encounters  Medication Dose Route Frequency Provider Last Rate Last Dose  . chlorhexidine gluconate (MEDLINE KIT) (PERIDEX) 0.12 %  solution 15 mL  15 mL Mouth Rinse BID Chesley Mires, MD      . MEDLINE mouth rinse  15 mL Mouth Rinse QID Chesley Mires, MD         Discharge Medications: Please see discharge summary for a list of discharge medications.  Relevant Imaging Results:  Relevant Lab Results:   Additional Information SS#: [id number]  Geralynn Ochs, LCSW

## 2017-06-20 NOTE — Progress Notes (Signed)
   Subjective: Doing well this AM. Resting comfortably on BiPAP. Able to coverse. States that he is on BiPAP because he typically sleeps with a CPAP. Asking about breakfast this AM. He reports that he was able to sit up yesterday and go most of the day without the BiPAP. We discussed getting more images of his lungs today and he agrees. He is currently lying flat and does not think it would be difficult to do it for the scan. All questions and concerns addressed.  Objective: Vital signs in last 24 hours: Vitals:   06/19/17 1924 06/19/17 2310 06/20/17 0015 06/20/17 0326  BP: (!) 152/83  (!) 147/77   Pulse: 79 77 75 74  Resp: 19 18 19 16   Temp: 97.8 F (36.6 C)  99.1 F (37.3 C)   TempSrc: Oral  Axillary   SpO2: 96%  95% 95%  Weight:       General: Morbidly obese male in no acute distress Pulm: Good air movement in the upper lung field but diminished breath sounds in the lower lung fields. No wheezing or crackles.  CV: Distant heart sounds, RRR, no murmurs, no rubs  Abdomen: Active bowel sounds, soft, no tenderness to palpation  Extremities: No LE edema, warm and dry   Assessment/Plan:  1. Acute Hypoxic Respiratory Failure  - Initially felt to be 2/2 heart failure exacerbation/voulme overload. Now concern for fluid overload vs amiodarone toxicity vs ARDS vs PNA. Does not appear volume overload - Elevated sed rate and procalcitonin  - Repeat CXR today is a poor image, appears rotated. Stable to minimal improvement in congestion. - Continue Solu-medrol 60 mg every 6 hours per PCCM  - Continue Levofloxacin 750 mg every 48 hours  - High resolution CT Chest today   2. Acute on Chronic Heart Failure  - Cardiology following, stopped lasix drip today started torsemide 100 mg bid this afternoon  - Discontinued Amiodarone on 11/3 due to amiodarone toxicity being on the differential   - Creatinine stable  3. AKI on CKD Stage III  - Creatinine stable at 2.17 today  - Continue to monitor  in the setting of aggressive diureses   4. Left Lower Extremity Cellulitis  - Bandage is dry, continue dressing changes PRN - PE consistent with LE stasis dermatitis  - Adequate coverage with Levofloxacin    5. Paroxysmal Atrial Fibrillation  - Previously rhythm controlled with Amiodarone  - Discontinued Amiodarone today  - Not on anticoagulation due to history of GI bleed  6. Diabetes Mellitus  - Consult diabetes coordinator for assistance in management while on Steroid and lasix drip  - Added on Novolog 10 units TID WC today  - On Lantus 60 units QHS and Resistant SSI  7. Hypertension  - BP max of 155/83 over the interval  - Continuing Amlodipine - Restart Hydralazine per cardiology   Dispo: Anticipated discharge pending clinical improvement.   Ina Homes, MD 06/20/2017, 5:14 AM My Pager: [phone]

## 2017-06-20 NOTE — Clinical Social Work Note (Signed)
Clinical Social Work Assessment  Patient Details  Name: [name] MRN: [id number] Date of Birth: 27-Jun-1946  Date of referral:  06/20/17               Reason for consult:  Facility Placement                Permission sought to share information with:  Facility Sport and exercise psychologist, Family Supports Permission granted to share information::  Yes, Verbal Permission Granted  Name::     Media planner::  SNF  Relationship::  Daughter-HCPOA  Contact Information:     Housing/Transportation Living arrangements for the past 2 months:  Apartment Source of Information:  Adult Children Patient Interpreter Needed:  None Criminal Activity/Legal Involvement Pertinent to Current Situation/Hospitalization:  No - Comment as needed Significant Relationships:  Adult Children, Siblings Lives with:  Self Do you feel safe going back to the place where you live?  Yes Need for family participation in patient care:  Yes (Comment)(patient not fully oriented)  Care giving concerns:  Patient currently lives home alone with support from aides and family, but will benefit from short term rehab at discharge prior to returning home in order to independently care for himself.   Social Worker assessment / plan:  CSW attempted to meet with patient and family at bedside, but patient was sleeping and would not wake up. CSW contacted patient's daughter via phone. CSW explained recommendation for SNF placement and provided facility options that are contracted with [facility]. CSW completed referral and faxed out to facilities. CSW to follow.  Employment status:  Retired Nurse, adult PT Recommendations:  [place] / Referral to community resources:  [place]  Patient/Family's Response to care:  Patient's response unable to be assessed. Patient's daughter is agreeable to SNF placement.  Patient/Family's Understanding of and Emotional Response to  Diagnosis, Current Treatment, and Prognosis:  Patient's response unable to be assessed. Patient's daughter is aware that the patient would benefit from SNF at this time, and will need help from the patient's family that lives in [place] in order to determine facility preferences. Patient's daughter was understanding that there are only certain facilities that are contracted with [facility]. Patient's daughter appreciated CSW assistance.  Emotional Assessment Appearance:  Appears stated age Attitude/Demeanor/Rapport:  Unable to Assess Affect (typically observed):  Unable to Assess Orientation:  Oriented to Self, Oriented to Place, Oriented to Situation Alcohol / Substance use:  Not Applicable Psych involvement (Current and /or in the community):  No (Comment)  Discharge Needs  Concerns to be addressed:  Care Coordination Readmission within the last 30 days:  No Current discharge risk:  Lives alone, Physical Impairment Barriers to Discharge:  Continued Medical Work up   [employer], [place] 06/20/2017, 4:45 PM

## 2017-06-20 NOTE — Progress Notes (Signed)
Nutrition Follow-up  DOCUMENTATION CODES:   Morbid obesity  INTERVENTION:  - Premier Protein BID, each supplement provides 160 kcal and 30 grams of protein.   NUTRITION DIAGNOSIS:   Increased nutrient needs related to acute illness as evidenced by estimated needs.  Ongoing   GOAL:   Provide needs based on ASPEN/SCCM guidelines  Progressing  MONITOR:   PO intake, Labs, Weight trends, I & O's, Skin  ASSESSMENT:   71 yo male with PMH of DM-2, CHF, HLD, gout, HTN, OSA, BPH, morbid obesity, LLE transmetatarsal amputation, cardiomyopathy, CAD, pacemaker, legally blind, peripheral neuropathy, DJD, arthritis, and A fib who was admitted on 10/29 with SOB requiring intubation.  Pt extubated 11/01. Spoke to family member at bedside. Reports pt's appetite is increasing, however varies given food options. Per chart pt consuming approximately 50% of meals at this time Reports pt received prune juice to alleviate some constipation and reports no N/V at this time. Given pt's increased needs and lower than normal PO intake, would benefit from nutritional supplement.  Labs reviewed; CBG 304-466 Medications reviewed; Vitamin D, Pepcid, sliding scale, Lantus, Solu-medrol, potassium chloride  Diet Order:  Diet Heart Room service appropriate? Yes; Fluid consistency: Thin  EDUCATION NEEDS:   No education needs have been identified at this time  Skin:  Skin Assessment: Skin Integrity Issues: Skin Integrity Issues:: Incisions Incisions: L leg open wound  Last BM:  06/16/17  Height:   Ht Readings from Last 1 Encounters:  06/07/17 6' (1.829 m)    Weight:   Wt Readings from Last 1 Encounters:  06/18/17 (!) 317 lb 14.5 oz (144.2 kg)    Ideal Body Weight:  80.9 kg  BMI:  Body mass index is 43.12 kg/m.  Estimated Nutritional Needs:   Kcal:  2083-2291  Protein:  > 144 grams  Fluid:  >/= 2 L/d  [place] Ranger, MS, RDN, LDN 06/20/2017 4:28 PM

## 2017-06-20 NOTE — Progress Notes (Signed)
PULMONARY / CRITICAL CARE MEDICINE   Name: [name] MRN: [id number] DOB: [birth year]    ADMISSION DATE:  06/13/2017 CONSULTATION DATE:  06/13/17  REFERRING MD:  Dr. Vanita Panda / EDP   CHIEF COMPLAINT:  Shortness of Breath   HISTORY OF PRESENT ILLNESS:  71 y/o M who presented to [facility] on 10/29 with reports of acute shortness of breath.    The patient is on BiPAP.  Information obtained from patient (as able), chart review and family at bedside.  The patient lives at home with the assistance of home health aides.  They are in/out of his home frequently during the day.  They assist him with all aspects of care.  In addition, [facility] per [facility].  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 Family reports that some of the aides are good about keeping him to a low sodium diet, others are not.  They are unsure if he actually follows his fluid restriction.  Daughter thinks he was recently taken off a "blood thinner" but she is unsure (none listed on home medication list).  He does carry a hx of PE and prior anticoagulation. Family (sister, daughter and brother in law deny knowledge of him ever smoking). They state he has been on the vent before but are unsure if he would want it again.   He was recently seen in the ER on 10/23 for dizziness and vomiting that resolved with meclizine and zofran. The patient denies known sick contacts. EMS was called for acute shortness of breath.  He was placed on CPAP and transported to the ER.  In the ER, he was found to be hypoxic requiring BiPAP 16/8 with 100% FiO2 (he dropped to the 80's on 80% O2 per RT).  Initial CXR shows concern for pulmonary edema with pleural effusion.  Initial labs- Na 135, K 5.1, CL 90, CO2 30, BUN 83, CR 3.30 (up from 2.49 on 10/23, baseline 2.1-2.6 in the last year), glucose 166, AST 78 / ALT 97, T.Bili 1.8, Troponin <0.03, WBC 18.2, Hgb 14.3 and platelets 192.  He is unable to describe further symptoms  due to BiPAP mask.   He is followed by the CHF clinic and was most recently seen on 03/23/17 by Oda Kilts, PA-C.  At that time his office weight was 339 lbs.  In 12/2016 he was 329lbs.  On 10/23 in the ER he was 340 lbs.    SUBJECTIVE:  Feels a bit better on bipap.  Tired.  Wants to sit up.    VITAL SIGNS: BP (!) 153/79 (BP Location: Left Arm)   Pulse 70   Temp 98.1 F (36.7 C) (Oral)   Resp 16   Wt (!) 144.2 kg (317 lb 14.5 oz)   SpO2 94%   BMI 43.12 kg/m   HEMODYNAMICS:    VENTILATOR SETTINGS: FiO2 (%):  [40 %-50 %] 40 %  INTAKE / OUTPUT: I/O last 3 completed shifts: In: 1098.8 [P.O.:480; I.V.:618.8] Out: 5150 [Urine:5150]  PHYSICAL EXAMINATION: General: Chronically ill-appearing obese male, no acute distress on BiPAP HEENT: Mucous membranes moist no JVD BiPAP Neuro: Awake and alert, follows commands  CV: RRR, S1/S2, -M/R/G. PULM: Bibasilar crackles, RR 18, NAD GI: Soft, NT, ND and +BS, obese Extremities: Erythema, cellulitis L leg, L toe amputations  Skin: Intact, warm and dry, no rash or lesion noted.  LABS:  BMET Recent Labs  Lab 06/18/17 1342 06/19/17 0247 06/20/17 0300  NA 144 139 138  K 4.2 3.5 3.7  CL 101 96* 96*  CO2 33* 30 30  BUN 60* 60* 66*  CREATININE 2.05* 2.20* 2.17*  GLUCOSE 178* 335* 341*   Electrolytes Recent Labs  Lab 06/16/17 0328 06/17/17 0500 06/18/17 0343 06/18/17 1342 06/19/17 0247 06/20/17 0300  CALCIUM 8.7* 8.9 9.1 9.1 9.1 9.2  MG 2.0 2.0 2.1  --   --   --   PHOS 3.4 4.1 3.9  --   --   --    CBC Recent Labs  Lab 06/18/17 0343 06/19/17 0247 06/20/17 0300  WBC 20.5* 17.8* 18.5*  HGB 12.9* 12.8* 13.1  HCT 43.1 42.4 42.6  PLT 187 185 204   Coag's Recent Labs  Lab 06/15/17 2207 06/19/17 0247  APTT 31 30  INR 1.20 1.28    Sepsis Markers Recent Labs  Lab 06/18/17 1342 06/19/17 0247 06/20/17 0300  PROCALCITON 0.40 0.30 0.29    ABG Recent Labs  Lab 06/17/17 0631 06/18/17 0440 06/18/17 1125  PHART  7.474* 7.442 7.463*  PCO2ART 54.0* 52.0* 50.7*  PO2ART 87.0 63.3* 79.8*    Liver Enzymes Recent Labs  Lab 06/14/17 0145  AST 47*  ALT 84*  ALKPHOS 98  BILITOT 1.1  ALBUMIN 2.5*    Cardiac Enzymes Recent Labs  Lab 06/13/17 1504 06/13/17 1923 06/14/17 0145  TROPONINI <0.03 <0.03 <0.03    Glucose Recent Labs  Lab 06/19/17 1225 06/19/17 1246 06/19/17 1555 06/19/17 1922 06/19/17 2213 06/20/17 0759  GLUCAP 432* 377* 365* 318* 304* 310*    Imaging Dg Chest Port 1 View  Result Date: 06/20/2017 CLINICAL DATA:  Shortness of Breath EXAM: PORTABLE CHEST 1 VIEW COMPARISON:  June 19, 2017 FINDINGS: Pacemaker leads are attached to the right atrium and right ventricle. There is cardiomegaly with pulmonary venous hypertension. There is aortic atherosclerosis. There is a small right pleural effusion. There is no edema or consolidation. No adenopathy evident. No pneumothorax. No bone lesions. IMPRESSION: Pulmonary venous hypertension. Aortic atherosclerosis. Small right pleural effusion. No edema or consolidation evident. Aortic Atherosclerosis (ICD10-I70.0). Electronically Signed   By: Lowella Grip III M.D.   On: 06/20/2017 08:16    STUDIES:  ECHO 10/29 >> EF 60-65%, IVC dilated. BLE Venous Duplex 10/29 >> neg PCXR 11/1 > edema CXR 11/3>>Worsening pulmonary edema. Unchanged cardiomegaly and pleural effusions  CULTURES: RVP 10/29 >> neg  ANTIBIOTICS: Keflex 10/30 > Levaquin 11/3>>   SIGNIFICANT EVENTS: 10/29  Admit with acute SOB  LINES/TUBES: ETT 10/29 > 10/31 >10/31>11/1 L IJ CVL 10/31 >     DISCUSSION: 70 y/o M with a complex medical history admitted 10/29 with acute onset SOB.  Suspect decompensated diastolic CHF with viral illness, PE also on differential (given high O2 needs).    ASSESSMENT / PLAN:  PULMONARY A: Acute Hypoxic Respiratory Failure - in setting of AKI, decompensated CHF, r/o PNA, viral illness, PE OSA - baseline CPAP 12cm H2O Hx PE in  2015 - not on anticoagulation due to rectal bleeding Desaturations 11/3>> net negative 8L Negative 1100 last 24 hours Concern for Amiodarone toxicity P:   bipap qhs and PRN  Titrate O2 for sat of 88-92% F/u ABG  Pulmonary hygiene  Continue lasix goal neg balance>> per CHF team and as renal function allows Hold amio  SED rate 100 >> drawn prior to steroid initiation>> concern for amiodarone toxicity Continue high dose steroids 60 mg Q 6  HRCT when patient can tolerate lying flat  CARDIOVASCULAR A:  Diastolic  CHF with concern for Acute Decompensation Hx HTN, HLD, CAD, PAF (not on anticoagulation due to rectal bleeding), bradycardia s/p pacemaker  P:  CHF MD following  Continue to hold amiodarone  Diuresis as tol  Continue preadmission amlodipine, aspirin, atorvastatin, hydralazine Strict I&O  RENAL A:   AKI on CKD - baseline sr cr 2.1-2.6  Hypokalemia risk with lasix gtt Metabolic Alkalosis with compensated respiratory acidosis Slight bump in creatinine 11/4 P:   Monitor chem with diuresis  Replace electrolytes as indicated Avoid nephrotoxic agents, ensure adequate renal perfusion  BMP daily Strict I&O  GASTROINTESTINAL A:   Morbid Obesity  Mild Elevation of LFT's - 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  P:   NPO while on BIPAP Pepcid   HEMATOLOGIC A:   DVT prophylaxis Hx of PE - LE duplex from 10/30 neg Oozing from L IJ site>> pressure dressing Coags 11/4>> PT 15.9;  INR 1.28;  APTT 30 P:  SCD on right leg (no left leg given cellulitis) Continue SQ heparin - CVL oozing is improved  Trend CBC daily Transfuse for HGB < 7 Monitor coags prn  INFECTIOUS A:   Possible LLE cellulitis Rule Out Viral Illness, PNA - RVP neg 10/29 Leukocytosis PCT 11/3>> 0.30 T Max 99.4 last 24 P:   Levaquin initiated 11/3 Trend fever curve and WBC Follow micro Culture as is clinically indicated  ENDOCRINE A:   DM - with CKD, resultant blindness    P:   Hold home oral  agents SSI Q4 with resistant scale  NEUROLOGIC A:   Acute Metabolic Encephalopathy  P:   D/C all sedation Neuro checks per unit routine  FAMILY  Updates:No family at bedside 11/5.  *IM teaching service primary.    Nickolas Madrid, NP 06/20/2017  9:29 AM Pager: [phone] or [phone]

## 2017-06-20 NOTE — Progress Notes (Signed)
Patient ID: [name], male   DOB: Nov 23, 1945, 71 y.o.   MRN: [id number]     Advanced Heart Failure Rounding Note  Primary Cardiologist: Dr. Aundra Dubin   Subjective:    Admitted with acute respiratory failure requiring intubation.   Intubated initially, extubated on 11/1.  He has had diffuse bilateral interstitial infiltrates by CXR despite significant diuresis (minimal improvement).  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  He is now getting methylprednisolone IV.  Lasix gtt continued yesterday, he diuresed well again.   This morning, he says that his breathing has improved.   Echo 06/14/17 LVEF 60-65%, Moderate AS  Objective:   Weight Range: (!) 317 lb 14.5 oz (144.2 kg) Body mass index is 43.12 kg/m.   Vital Signs:   Temp:  [97.6 F (36.4 C)-99.1 F (37.3 C)] 98.7 F (37.1 C) (11/05 0555) Pulse Rate:  [74-81] 74 (11/05 0326) Resp:  [15-26] 20 (11/05 0555) BP: (132-155)/(77-88) 153/79 (11/05 0555) SpO2:  [94 %-97 %] 97 % (11/05 0555) FiO2 (%):  [50 %] 50 % (11/04 1217) Last BM Date: 06/16/17 Weight change: Filed Weights   06/16/17 0452 06/17/17 0437 06/18/17 0300  Weight: (!) 332 lb 10.8 oz (150.9 kg) (!) 327 lb 2.6 oz (148.4 kg) (!) 317 lb 14.5 oz (144.2 kg)   Intake/Output:   Intake/Output Summary (Last 24 hours) at 06/20/2017 0808 Last data filed at 06/20/2017 0754 Gross per 24 hour  Intake 981.3 ml  Output 4960 ml  Net -3978.7 ml    Physical Exam    General: NAD, obese Neck: Thick, JVP difficult, no thyromegaly or thyroid nodule.  Lungs: Crackles dependently.  CV: Nondisplaced PMI.  Heart regular S1/S2, no S3/S4, no murmur.  No peripheral edema.   Abdomen: Soft, nontender, no hepatosplenomegaly, no distention.  Skin: Intact without lesions or rashes.  Neurologic: Alert and oriented x 3.  Psych: Normal affect. Extremities: No clubbing or cyanosis.  HEENT: Normal.    Telemetry   NSR 70s (personally reviewed).   EKG    N/A  Labs    CBC Recent Labs    06/19/17 0247 06/20/17 0300  WBC 17.8* 18.5*  HGB 12.8* 13.1  HCT 42.4 42.6  MCV 86.2 84.9  PLT 185 409   Basic Metabolic Panel Recent Labs    06/18/17 0343  06/19/17 0247 06/20/17 0300  NA 146*   < > 139 138  K 3.8   < > 3.5 3.7  CL 101   < > 96* 96*  CO2 33*   < > 30 30  GLUCOSE 145*   < > 335* 341*  BUN 60*   < > 60* 66*  CREATININE 2.12*   < > 2.20* 2.17*  CALCIUM 9.1   < > 9.1 9.2  MG 2.1  --   --   --   PHOS 3.9  --   --   --    < > = values in this interval not displayed.   Liver Function Tests No results for input(s): AST, ALT, ALKPHOS, BILITOT, PROT, ALBUMIN in the last 72 hours. No results for input(s): LIPASE, AMYLASE in the last 72 hours. Cardiac Enzymes No results for input(s): CKTOTAL, CKMB, CKMBINDEX, TROPONINI in the last 72 hours.  BNP: BNP (last 3 results) Recent Labs    02/09/17 1155 03/23/17 1113 06/13/17 0805  BNP 47.5 78.0 158.5*    ProBNP (last 3 results) No results for input(s): PROBNP in the last 8760 hours.   D-Dimer No  results for input(s): DDIMER in the last 72 hours. Hemoglobin A1C No results for input(s): HGBA1C in the last 72 hours. Fasting Lipid Panel No results for input(s): CHOL, HDL, LDLCALC, TRIG, CHOLHDL, LDLDIRECT in the last 72 hours. Thyroid Function Tests No results for input(s): TSH, T4TOTAL, T3FREE, THYROIDAB in the last 72 hours.  Invalid input(s): FREET3  Other results:   Imaging    No results found.   Medications:     Scheduled Medications: . amLODipine  10 mg Oral Daily  . atorvastatin  40 mg Per Tube Daily  . cholecalciferol  1,000 Units Oral Daily  . famotidine  20 mg Per Tube Daily  . heparin  5,000 Units Subcutaneous Q8H  . insulin aspart  0-20 Units Subcutaneous TID WC  . insulin aspart  0-5 Units Subcutaneous QHS  . insulin aspart  10 Units Subcutaneous TID WC  . insulin glargine  60 Units Subcutaneous Daily  . mouth rinse  15 mL Mouth Rinse BID    . methylPREDNISolone (SOLU-MEDROL) injection  60 mg Intravenous Q6H  . metolazone  2.5 mg Oral Once  . potassium chloride  40 mEq Oral BID  . rocuronium  100 mg Intravenous Once  . torsemide  100 mg Oral BID    Infusions: . sodium chloride 10 mL/hr at 06/16/17 0506  . levofloxacin (LEVAQUIN) IV Stopped (06/18/17 1624)    PRN Medications: acetaminophen, fentaNYL (SUBLIMAZE) injection, hydrALAZINE, ondansetron (ZOFRAN) IV, senna-docusate    Patient Profile   Mr Wingerter is a 71 year old with history of chronic diastolic heart failure complicated by RV failure, CKD Stage III, symptomatic bradycardia, S/P PPM, ICH 2015, PE 2015, OSA, DM  CAD nonobstructive LHC 2006, morbid obesity, and blindness.   Admitted with acute respiratory failure.  Intubated in the ED.   Assessment/Plan   1. Acute hypoxemic respiratory failure:  Initially on Bipap but later intubated => extubated 11/1.  As stated yesterday, infiltrates on CXR have seemed out of proportion to volume status. ESR 100. Very suspicious for amiodarone toxicity - Amio stopped 11/3. Now on high-dose steroids (methylprednisolone). Seems to be improving slightly - I think that he can lie flat today, will arrange for high resolution CT of chest today.  - PNA less likely, PCT not significantly elevated.   2. Acute on chronic diastolic CHF: Echo (32/95) with EF 60-65%, moderate AS.  He has been diuresed aggressively.  Though still with interstitial infiltrates on CXR, he does not seem to be volume overloaded at this point (though exam difficult).  Creatinine stable.  - Stop IV Lasix, start torsemide 100 mg bid this afternoon (home regimen).  Will not give him metolazone today, will likely need 2-3 times/week based on the past. - Watch renal function closely 3. AKI on CKD Stage III: Creatinine Stable at 2.17 today.  4. H/O PE 2015: Venous dopplers negative this admission.  5. ID: ?Viral PNA or pneumonitis.  Doubt bacterial PNA, PCT not  significantly elevated.  - Switched to [place] on 11/3 to get atypical coverage as well -> complete 5 days, today 3/5.  - WBCs mildly higher with steroids.  6. H/O Symptomatic Bradycardia: PPM 7. H/O ICH 2015: Not anticoagulated.    8. LLE Cellulitis: Resolved. Continued abx 9. HTN:  BP mildly elevated, can restart home hydralazine 25 mg tid.  10. Atrial fibrillation: Paroxysmal.  Not anticoagulated given history of intracerebral hemorrhage. NSR on telemetry.   - Stopped amio with ? toxicity 11. Aortic stenosis: Moderate on Echo 06/14/17.  Length of Stay: 7  Loralie Champagne, MD  06/20/2017, 8:08 AM  Advanced Heart Failure Team Pager [phone] (M-F; 7a - 4p)  Please contact [facility] for night-coverage after hours (4p -7a ) and weekends on [email or web address]

## 2017-06-20 NOTE — Progress Notes (Signed)
Inpatient Diabetes Program Recommendations  AACE/ADA: New Consensus Statement on Inpatient Glycemic Control (2015)  Target Ranges:  Prepandial:   less than 140 mg/dL      Peak postprandial:   less than 180 mg/dL (1-2 hours)      Critically ill patients:  140 - 180 mg/dL   Lab Results  Component Value Date   GLUCAP 310 (H) 06/20/2017   HGBA1C 7.0 (H) 01/05/2017    Review of Glycemic ControlResults for [name], [name] (MRN [id number]) as of 06/20/2017 09:43  Ref. Range 06/19/2017 12:46 06/19/2017 15:55 06/19/2017 19:22 06/19/2017 22:13 06/20/2017 07:59  Glucose-Capillary Latest Ref Range: 65 - 99 mg/dL 377 (H) 365 (H) 318 (H) 304 (H) 310 (H)   Diabetes history: Type 2 DM Outpatient Diabetes medications: Toujeo 85 units bid, Victoza 1.8 mg daily Current orders for Inpatient glycemic control:  Lantus 60 units daily, Novolog resistant tid with meals and HS, Novolog 10 units tid with meals  Inpatient Diabetes Program Recommendations:    Referral received. Please consider increasing Lantus to 60 units bid.     Thanks,  Adah Perl, RN, BC-ADM Inpatient Diabetes Coordinator Pager [phone] (8a-5p)

## 2017-06-20 NOTE — Progress Notes (Signed)
  Date: 06/20/2017  Patient name: [name]  Medical record number: [id number]  Date of birth: 1945/11/25   I have seen and evaluated this patient and I have discussed the plan of care with the house staff. Please see their note for complete details. I concur with their findings with the following additions/corrections: Off BiPAP and overall feeling a but better. Etiology oof acute hypoxic resp failure not clear - on FQ for ? bacterial PNA, was diuresed for pul edema, for CT to eval amiodarone pul toxicity.   [name], MD 06/20/2017, 11:41 AM

## 2017-06-20 NOTE — Progress Notes (Signed)
Physical Therapy Treatment Patient Details Name: [name] MRN: [id number] DOB: Nov 17, 1945 Today's Date: 06/20/2017    History of Present Illness Pt adm with acute respiratory failure due to decompensated diastolic heart failure. Pt aslo with AKI on CKD. Intubated 10/29-11/1. PMH - blindness, lt transmetatarsal amputation, ckd, morbid obesity, dm, htn, pacer, cad, blind    PT Comments    Pt participating with bed mobility today, rolled and sat EOB with mod to max A +2. Pt maintain sitting almost 10 mins and wanted to attempt standing but shoes not here (family to bring) and very fatigued sitting EOB, needing min A to prevent R posterior LOB, and pt needed to have a BM so returned to supine on bed pan. Continue to work on tolerance for activity. PT will continue to follow.    Follow Up Recommendations  SNF     Equipment Recommendations  None recommended by PT    Recommendations for Other Services       Precautions / Restrictions Precautions Precautions: Fall Precaution Comments: blind Restrictions Weight Bearing Restrictions: No    Mobility  Bed Mobility Overal bed mobility: Needs Assistance Bed Mobility: Supine to Sit;Sit to Supine     Supine to sit: Mod assist;+2 for physical assistance;Max assist Sit to supine: Max assist;+2 for physical assistance   General bed mobility comments: pt participating with bed mobility today. Was able to pull upper body towards rail. Max A for LE's off bed but then mod A +2 for trunk elevation into sitting. Pt unable to scoot to EOB, needed mod A to left hip. Pt able to manage trunk with return to supine but max A for LE's into bed and mod A +2 to be positioned.   Transfers                 General transfer comment: pt wanted to try to stand but was very fatigued in sitting EOB and having trouble balancing. In addition, he began to need to have bowel movement and needed to lie back down to get on bed pan  Ambulation/Gait             General Gait Details: unable   Stairs            Wheelchair Mobility    Modified Rankin (Stroke Patients Only)       Balance Overall balance assessment: Needs assistance Sitting-balance support: Feet supported;Bilateral upper extremity supported Sitting balance-Leahy Scale: Poor Sitting balance - Comments: sat EOB almost 10 mins with min A, R lean with manual facilitation to correct Postural control: Posterior lean;Right lateral lean                                  Cognition Arousal/Alertness: Awake/alert Behavior During Therapy: Flat affect Overall Cognitive Status: Impaired/Different from baseline                   Orientation Level: Disoriented to;Time;Situation Current Attention Level: Sustained Memory: Decreased short-term memory Following Commands: Follows one step commands inconsistently Safety/Judgement: Decreased awareness of safety;Decreased awareness of deficits   Problem Solving: Slow processing;Decreased initiation;Difficulty sequencing;Requires verbal cues;Requires tactile cues General Comments: pt states that he knows how to use call bell but when checked on him, he was unable to push button and when he was assisted, he did not say anything when asked what he needed. Discussed this with family      Exercises General Exercises - Lower  Extremity Ankle Circles/Pumps: AROM;Both;10 reps;Supine Short Arc Quad: AROM;Both;10 reps;Supine Heel Slides: AROM;Both;10 reps;Supine    General Comments General comments (skin integrity, edema, etc.): O2 sats down to 84% in sitting EOB but back in upper 80's/ low 90's after session      Pertinent Vitals/Pain Pain Assessment: Faces Faces Pain Scale: Hurts little more Pain Location: legs with movement to EOB Pain Descriptors / Indicators: Grimacing;Moaning Pain Intervention(s): Limited activity within patient's tolerance;Monitored during session    Home Living                       Prior Function            PT Goals (current goals can now be found in the care plan section) Acute Rehab PT Goals Patient Stated Goal: return home PT Goal Formulation: With patient/family Time For Goal Achievement: 07/01/17 Potential to Achieve Goals: Fair Progress towards PT goals: Progressing toward goals    Frequency    Min 3X/week      PT Plan Current plan remains appropriate    Co-evaluation              AM-PAC PT "6 Clicks" Daily Activity  Outcome Measure  Difficulty turning over in bed (including adjusting bedclothes, sheets and blankets)?: Unable Difficulty moving from lying on back to sitting on the side of the bed? : Unable Difficulty sitting down on and standing up from a chair with arms (e.g., wheelchair, bedside commode, etc,.)?: Unable Help needed moving to and from a bed to chair (including a wheelchair)?: Total Help needed walking in hospital room?: Total Help needed climbing 3-5 steps with a railing? : Total 6 Click Score: 6    End of Session Equipment Utilized During Treatment: Oxygen Activity Tolerance: Patient limited by fatigue;Other (comment)(need to have BM) Patient left: in bed;with call bell/phone within reach Nurse Communication: Mobility status;Other (comment)(pt on bed pan) PT Visit Diagnosis: Other abnormalities of gait and mobility (R26.89);Muscle weakness (generalized) (M62.81);Difficulty in walking, not elsewhere classified (R26.2)     Time: 1203-1220 PT Time Calculation (min) (ACUTE ONLY): 17 min  Charges:  $Therapeutic Activity: 8-22 mins                    G Codes:       Leighton Roach, PT  Acute Rehab Services  [place] 06/20/2017, 1:58 PM

## 2017-06-21 ENCOUNTER — Inpatient Hospital Stay (HOSPITAL_COMMUNITY): Payer: Medicare (Managed Care)

## 2017-06-21 LAB — GLUCOSE, CAPILLARY
GLUCOSE-CAPILLARY: 332 mg/dL — AB (ref 65–99)
GLUCOSE-CAPILLARY: 354 mg/dL — AB (ref 65–99)
GLUCOSE-CAPILLARY: 379 mg/dL — AB (ref 65–99)
Glucose-Capillary: 294 mg/dL — ABNORMAL HIGH (ref 65–99)

## 2017-06-21 LAB — CBC WITH DIFFERENTIAL/PLATELET
Basophils Absolute: 0 10*3/uL (ref 0.0–0.1)
Basophils Relative: 0 %
Eosinophils Absolute: 0 10*3/uL (ref 0.0–0.7)
Eosinophils Relative: 0 %
HEMATOCRIT: 42.1 % (ref 39.0–52.0)
HEMOGLOBIN: 13.1 g/dL (ref 13.0–17.0)
LYMPHS ABS: 1 10*3/uL (ref 0.7–4.0)
LYMPHS PCT: 7 %
MCH: 25.9 pg — AB (ref 26.0–34.0)
MCHC: 31.1 g/dL (ref 30.0–36.0)
MCV: 83.4 fL (ref 78.0–100.0)
MONOS PCT: 1 %
Monocytes Absolute: 0.2 10*3/uL (ref 0.1–1.0)
NEUTROS PCT: 92 %
Neutro Abs: 14.1 10*3/uL — ABNORMAL HIGH (ref 1.7–7.7)
Platelets: 201 10*3/uL (ref 150–400)
RBC: 5.05 MIL/uL (ref 4.22–5.81)
RDW: 15.5 % (ref 11.5–15.5)
WBC: 15.3 10*3/uL — AB (ref 4.0–10.5)

## 2017-06-21 LAB — BASIC METABOLIC PANEL
Anion gap: 11 (ref 5–15)
BUN: 65 mg/dL — ABNORMAL HIGH (ref 6–20)
CHLORIDE: 96 mmol/L — AB (ref 101–111)
CO2: 29 mmol/L (ref 22–32)
Calcium: 9.1 mg/dL (ref 8.9–10.3)
Creatinine, Ser: 2.07 mg/dL — ABNORMAL HIGH (ref 0.61–1.24)
GFR calc non Af Amer: 31 mL/min — ABNORMAL LOW (ref >60)
GFR, EST AFRICAN AMERICAN: 36 mL/min — AB (ref >60)
Glucose, Bld: 294 mg/dL — ABNORMAL HIGH (ref 65–99)
POTASSIUM: 3.8 mmol/L (ref 3.5–5.1)
SODIUM: 136 mmol/L (ref 135–145)

## 2017-06-21 MED ORDER — METHYLPREDNISOLONE SODIUM SUCC 40 MG IJ SOLR
40.0000 mg | Freq: Two times a day (BID) | INTRAMUSCULAR | Status: DC
  Administered 2017-06-21 – 2017-06-22 (×2): 40 mg via INTRAVENOUS
  Filled 2017-06-21 (×2): qty 1

## 2017-06-21 MED ORDER — HYDRALAZINE HCL 25 MG PO TABS
37.5000 mg | ORAL_TABLET | Freq: Three times a day (TID) | ORAL | Status: DC
  Administered 2017-06-21 – 2017-06-22 (×3): 37.5 mg via ORAL
  Filled 2017-06-21 (×4): qty 1.5

## 2017-06-21 MED ORDER — INSULIN ASPART 100 UNIT/ML ~~LOC~~ SOLN
15.0000 [IU] | Freq: Three times a day (TID) | SUBCUTANEOUS | Status: DC
  Administered 2017-06-21 – 2017-06-24 (×9): 15 [IU] via SUBCUTANEOUS

## 2017-06-21 MED ORDER — INSULIN GLARGINE 100 UNIT/ML ~~LOC~~ SOLN
75.0000 [IU] | Freq: Two times a day (BID) | SUBCUTANEOUS | Status: DC
  Administered 2017-06-21 – 2017-06-22 (×3): 75 [IU] via SUBCUTANEOUS
  Filled 2017-06-21 (×4): qty 0.75

## 2017-06-21 NOTE — Progress Notes (Signed)
  Date: 06/21/2017  Patient name: [name]  Medical record number: [id number]  Date of birth: Jun 25, 1946   I have seen and evaluated this patient and I have discussed the plan of care with the house staff. Please see their note for complete details. I concur with their findings with the following additions/corrections: [name] was asleep in BiPap during AM rounds. discussed care with team. Multipronged treatment approach to hypoxia.   [name], MD 06/21/2017, 6:10 PM

## 2017-06-21 NOTE — Progress Notes (Signed)
PULMONARY / CRITICAL CARE MEDICINE   Name: [name] MRN: [id number] DOB: 09-21-1945    ADMISSION DATE:  06/13/2017 CONSULTATION DATE:  06/13/17  REFERRING MD:  Dr. Vanita Panda / EDP   CHIEF COMPLAINT:  Shortness of Breath   HISTORY OF PRESENT ILLNESS:  71 y/o M who presented to [facility] on 10/29 with reports of acute shortness of breath.    The patient is on BiPAP.  Information obtained from patient (as able), chart review and family at bedside.  The patient lives at home with the assistance of home health aides.  They are in/out of his home frequently during the day.  They assist him with all aspects of care.  In addition, [facility].  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 Family reports that some of the aides are good about keeping him to a low sodium diet, others are not.  They are unsure if he actually follows his fluid restriction.  Daughter thinks he was recently taken off a "blood thinner" but she is unsure (none listed on home medication list).  He does carry a hx of PE and prior anticoagulation. Family (sister, daughter and brother in law deny knowledge of him ever smoking). They state he has been on the vent before but are unsure if he would want it again.   He was recently seen in the ER on 10/23 for dizziness and vomiting that resolved with meclizine and zofran. The patient denies known sick contacts. EMS was called for acute shortness of breath.  He was placed on CPAP and transported to the ER.  In the ER, he was found to be hypoxic requiring BiPAP 16/8 with 100% FiO2 (he dropped to the 80's on 80% O2 per RT).  Initial CXR shows concern for pulmonary edema with pleural effusion.  Initial labs- Na 135, K 5.1, CL 90, CO2 30, BUN 83, CR 3.30 (up from 2.49 on 10/23, baseline 2.1-2.6 in the last year), glucose 166, AST 78 / ALT 97, T.Bili 1.8, Troponin <0.03, WBC 18.2, Hgb 14.3 and platelets 192.  He is unable to describe further symptoms  due to BiPAP mask.   He is followed by the CHF clinic and was most recently seen on 03/23/17 by Oda Kilts, PA-C.  At that time his office weight was 339 lbs.  In 12/2016 he was 329lbs.  On 10/23 in the ER he was 340 lbs.    SUBJECTIVE:  Continues to feel better. Off BiPAP x 2 hours this am on 6L [place]. Stood with PT today, lowest sat was 94%. Plans to so for short term rehab once discharged.   VITAL SIGNS: BP (!) 155/85 (BP Location: Left Arm)   Pulse 70   Temp 99 F (37.2 C) (Axillary)   Resp 19   Ht 6' (1.829 m)   Wt (!) 323 lb 3.1 oz (146.6 kg)   SpO2 96%   BMI 43.83 kg/m   HEMODYNAMICS:    VENTILATOR SETTINGS: FiO2 (%):  [40 %] 40 %  INTAKE / OUTPUT: I/O last 3 completed shifts: In: 607.4 [P.O.:240; I.V.:367.4] Out: 2860 [Urine:2860]  PHYSICAL EXAMINATION: General: Chronically ill-appearing obese male, no acute distress on 6L [place], he is blind. HEENT: Mucous membranes moist,  no JVD , Rudy Neuro: Awake and alert, follows commands, MAE x 4, appropriate  CV: RRR, S1/S2, -M/R/G. PULM: Bibasilar crackles, RR 18, NAD, saturation 98% at rest on 6L. GI: Soft,  NT, ND and +BS, obese Extremities: Erythema, cellulitis L leg, L toe amputations  Skin: Intact, warm and dry, no rash or lesion noted.  LABS:  BMET Recent Labs  Lab 06/19/17 0247 06/20/17 0300 06/21/17 0218  NA 139 138 136  K 3.5 3.7 3.8  CL 96* 96* 96*  CO2 30 30 29   BUN 60* 66* 65*  CREATININE 2.20* 2.17* 2.07*  GLUCOSE 335* 341* 294*   Electrolytes Recent Labs  Lab 06/16/17 0328 06/17/17 0500 06/18/17 0343  06/19/17 0247 06/20/17 0300 06/21/17 0218  CALCIUM 8.7* 8.9 9.1   < > 9.1 9.2 9.1  MG 2.0 2.0 2.1  --   --   --   --   PHOS 3.4 4.1 3.9  --   --   --   --    < > = values in this interval not displayed.   CBC Recent Labs  Lab 06/19/17 0247 06/20/17 0300 06/21/17 0218  WBC 17.8* 18.5* 15.3*  HGB 12.8* 13.1 13.1  HCT 42.4 42.6 42.1  PLT 185 204 201   Coag's Recent Labs  Lab  06/15/17 2207 06/19/17 0247  APTT 31 30  INR 1.20 1.28    Sepsis Markers Recent Labs  Lab 06/18/17 1342 06/19/17 0247 06/20/17 0300  PROCALCITON 0.40 0.30 0.29    ABG Recent Labs  Lab 06/17/17 0631 06/18/17 0440 06/18/17 1125  PHART 7.474* 7.442 7.463*  PCO2ART 54.0* 52.0* 50.7*  PO2ART 87.0 63.3* 79.8*    Liver Enzymes No results for input(s): AST, ALT, ALKPHOS, BILITOT, ALBUMIN in the last 168 hours.  Cardiac Enzymes No results for input(s): TROPONINI, PROBNP in the last 168 hours.  Glucose Recent Labs  Lab 06/19/17 2213 06/20/17 0759 06/20/17 1157 06/20/17 1725 06/20/17 2105 06/21/17 0811  GLUCAP 304* 310* 466* 376* 343* 294*    Imaging Ct Chest High Resolution  Result Date: 06/20/2017 CLINICAL DATA:  Inpatient. Dyspnea. Clinical concern for interstitial lung disease. EXAM: CT CHEST WITHOUT CONTRAST TECHNIQUE: Multidetector CT imaging of the chest was performed following the standard protocol without intravenous contrast. High resolution imaging of the lungs, as well as inspiratory and expiratory imaging, was performed. COMPARISON:  Chest radiograph from earlier today. 10/04/2013 chest CT angiogram. FINDINGS: Motion degraded scan. Cardiovascular: Moderate cardiomegaly. No significant pericardial fluid/thickening. Left anterior descending, left circumflex and right coronary atherosclerosis. Two lead right subclavian pacemaker is noted with lead tips in the right atrium and right ventricular apex. Atherosclerotic thoracic aorta with ectatic 4.0 cm ascending thoracic aorta, stable. Dilated main pulmonary artery (4.0 cm diameter, increased. Mediastinum/Nodes: No discrete thyroid nodules. Unremarkable esophagus. No axillary adenopathy. Newly mildly enlarged 1.5 cm subcarinal node (series 3/image 70). No additional pathologically enlarged mediastinal or discrete hilar nodes on this noncontrast scan. Lungs/Pleura: No pneumothorax. Small dependent bilateral pleural  effusions. Focal narrowing of the midthoracic tracheal lumen by presumed mucoid debris (series 6/ image 30) . Moderate compressive atelectasis in the dependent lower lobes bilaterally. Prominent patchy ground-glass attenuation and interlobular septal thickening throughout the aerated portions of the lungs bilaterally. Otherwise no acute consolidative airspace disease, lung masses or significant pulmonary nodules in the aerated portions of the lungs. No significant regions of traction bronchiectasis or architectural distortion. No frank honeycombing. No significant air trapping on the expiration sequence. Upper abdomen: No acute abnormality. Musculoskeletal: No aggressive appearing focal osseous lesions. Mild thoracic spondylosis. IMPRESSION: 1. No findings suggestive of interstitial lung disease on this limited motion degraded scan. 2. Spectrum of findings compatible with congestive heart failure. Moderate  cardiomegaly. Small dependent bilateral pleural effusions. Diffuse patchy ground-glass attenuation and interlobular septal thickening compatible with pulmonary edema. 3. Moderate bilateral lower lobe atelectasis. 4. Focal narrowing of the midthoracic trachea lumen by presumed mucoid debris. 5. New mild mediastinal lymphadenopathy, nonspecific, probably reactive. 6. Three-vessel coronary atherosclerosis. 7. Ectatic 4.0 cm ascending thoracic aorta. Recommend annual imaging followup by CTA or MRA. This recommendation follows 2010 ACCF/AHA/AATS/ACR/ASA/SCA/SCAI/SIR/STS/SVM Guidelines for the Diagnosis and Management of Patients with Thoracic Aortic Disease. Circulation. 2010; 121: N397-Q734. Aortic Atherosclerosis (ICD10-I70.0). Electronically Signed   By: Ilona Sorrel M.D.   On: 06/20/2017 16:51    STUDIES:  ECHO 10/29 >> EF 60-65%, IVC dilated. BLE Venous Duplex 10/29 >> neg PCXR 11/1 > edema CXR 11/3>>Worsening pulmonary edema. Unchanged cardiomegaly and pleural effusions HRCT 11/5>> no signs of  interstitial lung disease + small dependent bilateral pleural effusions, diffuse patchy ground-glass attenuation, and interlobular septal thickening compatible with pulmonary edema. Shunting physiology is the most likely explanation .   CULTURES: RVP 10/29 >> neg  ANTIBIOTICS: Keflex 10/30 > Levaquin 11/3>>   SIGNIFICANT EVENTS: 10/29  Admit with acute SOB  LINES/TUBES: ETT 10/29 > 10/31 >10/31>11/1 L IJ CVL 10/31 > 06/16/2017     DISCUSSION: 71 y/o M with a complex medical history admitted 10/29 with acute onset SOB.  Suspect decompensated diastolic CHF with viral illness, PE also on differential (given high O2 needs), as well as amiodarone toxicity. HRCT was negative for ILD, but + for small dependent bilateral pleural effusions, diffuse patchy ground-glass attenuation, and interlobular septal thickening compatible with pulmonary edema. Shunting physiology is the most likely explanation .DDx includes AVMs, bacterial/viral PNA, ARDS, heart failure/volume overload, pneumonitis (Amiodarone /  aspiratation)   ASSESSMENT / PLAN:  PULMONARY A: Acute Hypoxic Respiratory Failure - in setting of AKI, decompensated CHF, r/o PNA, viral illness, PE OSA - baseline CPAP 12cm H2O Hx PE in 2015 - not on anticoagulation due to rectal bleeding Net negative 12.8 L since admission Negative 1100 last 24 hours Concern for Amiodarone pneumonitis/ Aspiration pneumonitis P:   bipap qhs and PRN  Titrate O2 for sat of 88-92% Continue Levaquin 750 Q 48 hours F/u ABG prn  Aggressive Pulmonary hygiene  IS Q 1 as able  Mobilize as able PT/OT consult Continue lasix goal neg balance>> per CHF team and as renal function allows Hold amio/ ? amio pneumonitis HRCT negative for ILD, but  Does not rule out possible amio pneumonitis>> slow  taper steroids per primary team Continue Torsemide per primary team  CXR prn   CARDIOVASCULAR A:  Diastolic CHF with concern for Acute Decompensation Hx HTN, HLD,  CAD, PAF (not on anticoagulation due to rectal bleeding), bradycardia s/p pacemaker  Net negative 12.8 L since admission P:  CHF Team following Continue to hold amiodarone  Diuresis as renal function allows:  torsemide 100 mg bid per CHF team Continue preadmission amlodipine, aspirin, atorvastatin, hydralazine Strict I&O   RENAL A:   AKI on CKD - baseline sr cr 2.1-2.6  Hypokalemia risk with lasix gtt Metabolic Alkalosis with compensated respiratory acidosis Creatinine stable 11/5 at 2.07 P:   Monitor chem with aggressive  diuresis  Replace electrolytes as indicated Avoid nephrotoxic agents, ensure adequate renal perfusion  Trend BMP daily Strict I&O  GASTROINTESTINAL A:   Morbid Obesity  Mild Elevation of LFT's - 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  P:   NPO while on BIPAP Pepcid   HEMATOLOGIC A:   DVT prophylaxis Hx of PE - LE duplex from 10/30 neg  Oozing from L IJ site>> pressure dressing Coags 11/4>> PT 15.9;  INR 1.28;  APTT 30 P:  SCD on right leg (no left leg given cellulitis) Continue SQ heparin - CVL oozing is improved  Trend CBC daily Transfuse for HGB < 7 Monitor coags prn  INFECTIOUS A:   Possible LLE cellulitis Rule Out Viral Illness, PNA - RVP neg 10/29 Leukocytosis on steroids PCT 11/3>> 0.30 T Max 99.4 last 24 P:   Continue Levaquin Trend fever curve and WBC Follow micro Culture as is clinically indicated  ENDOCRINE A:   DM - with CKD, resultant blindness  Elevated glucose on steroids   P:   Hold home oral agents SSI Q4 with resistant scale Consult to Diabetes Coordinator per primary team  NEUROLOGIC A:   Acute Metabolic Encephalopathy  P:   D/C all sedation Neuro checks per unit routine  FAMILY  Updates:No family at bedside 11/6.  *IM teaching service primary.    Magdalen Spatz, AGACNP- [facility] 06/21/2017  9:18 AM Pager:  [phone]

## 2017-06-21 NOTE — Progress Notes (Signed)
Physical Therapy Treatment Patient Details Name: [name] MRN: [id number] DOB: 15-Feb-1946 Today's Date: 06/21/2017    History of Present Illness Pt adm with acute respiratory failure due to decompensated diastolic heart failure. Pt aslo with AKI on CKD. Intubated 10/29-11/1. PMH - blindness, lt transmetatarsal amputation, ckd, morbid obesity, dm, htn, pacer, cad, blind    PT Comments    Pt progressing well. Pt able to perform sit to stand with RW this session. Unable to take pivot steps for transfer to recliner. Pt fatigued after stance. May benefit from use of stedy.   Follow Up Recommendations  SNF     Equipment Recommendations  None recommended by PT    Recommendations for Other Services       Precautions / Restrictions Precautions Precautions: Fall;Other (comment) Precaution Comments: blind Restrictions Weight Bearing Restrictions: No    Mobility  Bed Mobility   Bed Mobility: Rolling Rolling: Mod assist;+2 for physical assistance   Supine to sit: +2 for physical assistance;Mod assist;Max assist Sit to supine: Max assist;+2 for physical assistance   General bed mobility comments: Verbal cues for sequencing. Assist with BLE off EOB and to elevate trunk. Increased time and effort.  Transfers Overall transfer level: Needs assistance Equipment used: Rolling walker (2 wheeled) Transfers: Sit to/from Stand Sit to Stand: +2 physical assistance;Mod assist         General transfer comment: Multi attempts required. Pt able to static stand with RW and +2 min assist x 30 seconds. Unable to take pivot steps for transfer to recliner. SpO2 94%.  Ambulation/Gait             General Gait Details: unable   Stairs            Wheelchair Mobility    Modified Rankin (Stroke Patients Only)       Balance Overall balance assessment: Needs assistance Sitting-balance support: Feet supported;Bilateral upper extremity supported Sitting balance-Leahy Scale:  Fair Sitting balance - Comments: sat EOB x 10 minutes to take meds min guard assist.   Standing balance support: Bilateral upper extremity supported Standing balance-Leahy Scale: Poor Standing balance comment: RW and physical assist to maintain static stance.                            Cognition Arousal/Alertness: Awake/alert Behavior During Therapy: Flat affect Overall Cognitive Status: Impaired/Different from baseline                   Orientation Level: Disoriented to;Situation;Time Current Attention Level: Selective Memory: Decreased short-term memory Following Commands: Follows one step commands with increased time Safety/Judgement: Decreased awareness of safety;Decreased awareness of deficits   Problem Solving: Slow processing;Decreased initiation;Difficulty sequencing;Requires verbal cues;Requires tactile cues        Exercises General Exercises - Lower Extremity Ankle Circles/Pumps: AROM;Both;10 reps;Supine Heel Slides: AROM;Both;10 reps;Supine    General Comments        Pertinent Vitals/Pain Pain Assessment: Faces Faces Pain Scale: Hurts little more Pain Location: legs with movement to EOB Pain Descriptors / Indicators: Grimacing;Moaning Pain Intervention(s): Monitored during session    Home Living                      Prior Function            PT Goals (current goals can now be found in the care plan section) Acute Rehab PT Goals Patient Stated Goal: return home PT Goal Formulation: With patient/family Time For  Goal Achievement: 07/01/17 Potential to Achieve Goals: Fair Progress towards PT goals: Progressing toward goals    Frequency    Min 3X/week      PT Plan Current plan remains appropriate    Co-evaluation              AM-PAC PT "6 Clicks" Daily Activity  Outcome Measure  Difficulty turning over in bed (including adjusting bedclothes, sheets and blankets)?: Unable Difficulty moving from lying on back  to sitting on the side of the bed? : Unable Difficulty sitting down on and standing up from a chair with arms (e.g., wheelchair, bedside commode, etc,.)?: Unable Help needed moving to and from a bed to chair (including a wheelchair)?: A Lot Help needed walking in hospital room?: Total Help needed climbing 3-5 steps with a railing? : Total 6 Click Score: 7    End of Session Equipment Utilized During Treatment: Oxygen;Gait belt Activity Tolerance: Patient tolerated treatment well Patient left: in bed;with call bell/phone within reach;with nursing/sitter in room Nurse Communication: Mobility status PT Visit Diagnosis: Other abnormalities of gait and mobility (R26.89);Muscle weakness (generalized) (M62.81);Difficulty in walking, not elsewhere classified (R26.2)     Time: 3748-2707 PT Time Calculation (min) (ACUTE ONLY): 34 min  Charges:  $Therapeutic Activity: 23-37 mins                    G Codes:       [name], PT  Office # [phone] Pager [phone]    [name] 06/21/2017, 10:09 AM

## 2017-06-21 NOTE — Progress Notes (Signed)
Inpatient Diabetes Program Recommendations  AACE/ADA: New Consensus Statement on Inpatient Glycemic Control (2015)  Target Ranges:  Prepandial:   less than 140 mg/dL      Peak postprandial:   less than 180 mg/dL (1-2 hours)      Critically ill patients:  140 - 180 mg/dL   Lab Results  Component Value Date   GLUCAP 294 (H) 06/21/2017   HGBA1C 7.0 (H) 01/05/2017   Diabetes history: Type 2 DM Outpatient Diabetes medications: Toujeo 85 units bid, Victoza 1.8 mg daily Current orders for Inpatient glycemic control:  Lantus 60 units daily, Novolog resistant tid with meals and HS, Novolog 10 units tid with meals Solumedrol 40 mg IV q 12 hours (reduced today Inpatient Diabetes Program Recommendations:     Please consider increasing Lantus to 75 units bid.   Also consider increasing Novolog meal coverage to 14 units tid with meals.   Thanks, Adah Perl, RN, BC-ADM Inpatient Diabetes Coordinator Pager [phone] (8a-5p)

## 2017-06-21 NOTE — Plan of Care (Signed)
  Acute Rehab PT Goals(only PT should resolve) Pt Will Go Supine/Side To Sit 06/21/2017 1010 - Progressing by Philippa Sicks, PT   Acute Rehab PT Goals(only PT should resolve) Patient Will Perform Sitting Balance 06/21/2017 1010 - Progressing by Philippa Sicks, PT   Acute Rehab PT Goals(only PT should resolve) Patient Will Transfer Sit To/From Stand 06/21/2017 1010 - Progressing by Philippa Sicks, PT   Acute Rehab PT Goals(only PT should resolve) Pt Will Transfer Bed To Chair/Chair To Bed 06/21/2017 1010 - Progressing by Philippa Sicks, PT

## 2017-06-21 NOTE — Progress Notes (Addendum)
Subjective: On BiPAP right now. Able to wake up and talk with [place]. Says that he is doing well. Difficult to discuss with patient while he is on BiPAP. Will swing by this afternoon to talk with the patient more.   Objective: Vital signs in last 24 hours: Vitals:   06/21/17 0002 06/21/17 0334 06/21/17 0341 06/21/17 0357  BP: (!) 145/86 127/68 127/68   Pulse: 77 70 71   Resp: 20 15 15 15   Temp:  98.8 F (37.1 C)    TempSrc:  Axillary  Axillary  SpO2: 96% 95% 94%   Weight:  (!) 323 lb 3.1 oz (146.6 kg)    Height:    6' (1.829 m)   General: Morbidly obese resting comfortably on BiPAP Pulm: Difficult to auscultate while on BiPAP CV: Distant heart sounds, RRR, no murmur Abdomen: Active bowel sounds, soft, no tenderness to palpation  Extremities: Non pitting LE edema. Lichenification of the LE bilaterally. Bandage on the left LE clean and dry   High Resolution Chest CT IMPRESSION: 1. No findings suggestive of interstitial lung disease on this limited motion degraded scan. 2. Spectrum of findings compatible with congestive heart failure. Moderate cardiomegaly. Small dependent bilateral pleural effusions. Diffuse patchy ground-glass attenuation and interlobular septal thickening compatible with pulmonary edema. 3. Moderate bilateral lower lobe atelectasis. 4. Focal narrowing of the midthoracic trachea lumen by presumed mucoid debris. 5. New mild mediastinal lymphadenopathy, nonspecific, probably reactive. 6. Three-vessel coronary atherosclerosis. 7. Ectatic 4.0 cm ascending thoracic aorta. Recommend annual imaging followup by CTA or MRA.   Assessment/Plan:  1. Acute Hypoxic Respiratory Failure  - Elevated A-a gradient in the setting of acute hypoxic respiratory failure, either due to shunting, V/Q mismatch, or limited diffusion capacity. Some of this presentation is due to hypoventilation and chronic respiratory acidosis. High Resolution Chest CT on 11/5 illustrated no signs of  interstitial lung disease but did show small dependent bilateral pleural effusions, diffuse patchy ground-glass attenuation, and interlobular septal thickening compatible with pulmonary edema. Given that the patient remained hypoxic on NRB, had an elevated A-a gradient >500, and the CT did not illustrate any ILD shunting physiology is the most likely. Echocardiogram with contrast on 10/08/2013 showed no right-to-left atrial shunting making intracardiac shunting unlikely. DDx includes AVMs, bacterial/viral PNA, ARDS, heart failure/volume overload, pneumonitis (aspiration? Unlikely given bilateral) - Patient is negative 12.8 L since admission and weight has decreased approximately 20 lbs. Continue diuresing per cardiology - Continue Levofloxacin 750 mg every 48 hours  - Would consider tapering down Solu-medrol  - PT recommending SNF  2. Acute on Chronic Heart Failure  - Cardiology following diuresing with torsemide 100 mg bid   - Discontinued Amiodaroneon 11/3. CT illustrating no ILD however does not rule-out amiodarone pneumonitis  - Creatinine stable  3. AKI on CKD Stage III  - Creatinine stable at 2.07today  - Continue to monitor in the setting of aggressive diureses   4. Left Lower Extremity Cellulitis  - Bandage is dry, continue dressing changes PRN - PE consistent with LE stasis dermatitis  -Adequate coverage with Levofloxacin  5. Paroxysmal Atrial Fibrillation  - Previously rhythm controlled with Amiodarone  - Discontinued Amiodarone today  - Not on anticoagulation due to history of GI bleed  6. Diabetes Mellitus  - Consult diabetes coordinator for assistance in management while on Steroid and lasix drip  - Added on Novolog 10 units TID WC today  - On Lantus 60 units QHS and Resistant SSI  7. Hypertension  -  Continuing Amlodipine and Hydralazine  Dispo: Anticipated discharge pending clinical improvement and placement.   Ina Homes, MD 06/21/2017, 4:55 AM My  Pager: [phone]

## 2017-06-21 NOTE — Progress Notes (Signed)
Patient ID: [name], male   DOB: 1945-12-04, 71 y.o.   MRN: [id number]     Advanced Heart Failure Rounding Note  Primary Cardiologist: Dr. Aundra Dubin   Subjective:    Admitted with acute respiratory failure requiring intubation.   Intubated initially, extubated on 11/1.  He has had diffuse bilateral interstitial infiltrates by CXR despite significant diuresis (minimal improvement).  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  He is now getting methylprednisolone IV.    Transitioned to po torsemide 06/20/17. Negative 1.1 L. First weight in several days, so unclear accuracy.   Feeling better. Sleeping with BiPAP on my arrival, rouses easily. Denies CP or SOB at this time. No lightheadedness or dizziness.   Echo 06/14/17 LVEF 60-65%, Moderate AS  High Res Chest CT 06/20/17 1. No findings suggestive of interstitial lung disease on this limited motion degraded scan. 2. Spectrum of findings compatible with congestive heart failure. Moderate cardiomegaly. Small dependent bilateral pleural effusions. Diffuse patchy ground-glass attenuation and interlobular septal thickening compatible with pulmonary edema. 3. Moderate bilateral lower lobe atelectasis. 4. Focal narrowing of the midthoracic trachea lumen by presumed mucoid debris. 5. New mild mediastinal lymphadenopathy, nonspecific, probably reactive. 6. Three-vessel coronary atherosclerosis. 7. Ectatic 4.0 cm ascending thoracic aorta. Recommend annual imaging followup by CTA or MRA.   Objective:   Weight Range: (!) 323 lb 3.1 oz (146.6 kg) Body mass index is 43.83 kg/m.   Vital Signs:   Temp:  [97.8 F (36.6 C)-99.4 F (37.4 C)] 99 F (37.2 C) (11/06 0744) Pulse Rate:  [70-81] 70 (11/06 0744) Resp:  [15-29] 19 (11/06 0744) BP: (127-170)/(68-87) 155/85 (11/06 0744) SpO2:  [91 %-96 %] 96 % (11/06 0744) FiO2 (%):  [40 %] 40 % (11/05 1850) Weight:  [323 lb 3.1 oz (146.6 kg)] 323 lb 3.1 oz (146.6 kg)  (11/06 0334) Last BM Date: 06/20/17 Weight change: Filed Weights   06/17/17 0437 06/18/17 0300 06/21/17 0334  Weight: (!) 327 lb 2.6 oz (148.4 kg) (!) 317 lb 14.5 oz (144.2 kg) (!) 323 lb 3.1 oz (146.6 kg)   Intake/Output:   Intake/Output Summary (Last 24 hours) at 06/21/2017 1044 Last data filed at 06/21/2017 1007 Gross per 24 hour  Intake 75 ml  Output 800 ml  Net -725 ml    Physical Exam    General: NAD, obese HEENT: Normal Neck: Supple. JVP difficult. Carotids 2+ bilat; no bruits. No thyromegaly or nodule noted. Cor: PMI nondisplaced. RRR, No M/G/R noted Lungs: Dependent crackles, otherwise clear.  Abdomen: Soft, non-tender, non-distended, no HSM. No bruits or masses. +BS  Extremities: No cyanosis, clubbing, or rash. R and LLE no edema.  Neuro: Alert & orientedx3, cranial nerves grossly intact. moves all 4 extremities w/o difficulty. Affect pleasant   Telemetry   NSR 70s, personally reviewed.   EKG    N/A  Labs    CBC Recent Labs    06/20/17 0300 06/21/17 0218  WBC 18.5* 15.3*  NEUTROABS  --  14.1*  HGB 13.1 13.1  HCT 42.6 42.1  MCV 84.9 83.4  PLT 204 834   Basic Metabolic Panel Recent Labs    06/20/17 0300 06/21/17 0218  NA 138 136  K 3.7 3.8  CL 96* 96*  CO2 30 29  GLUCOSE 341* 294*  BUN 66* 65*  CREATININE 2.17* 2.07*  CALCIUM 9.2 9.1   Liver Function Tests No results for input(s): AST, ALT, ALKPHOS, BILITOT, PROT, ALBUMIN in the last 72 hours. No results for input(s):  LIPASE, AMYLASE in the last 72 hours. Cardiac Enzymes No results for input(s): CKTOTAL, CKMB, CKMBINDEX, TROPONINI in the last 72 hours.  BNP: BNP (last 3 results) Recent Labs    02/09/17 1155 03/23/17 1113 06/13/17 0805  BNP 47.5 78.0 158.5*   ProBNP (last 3 results) No results for input(s): PROBNP in the last 8760 hours.  D-Dimer No results for input(s): DDIMER in the last 72 hours. Hemoglobin A1C No results for input(s): HGBA1C in the last 72 hours. Fasting  Lipid Panel No results for input(s): CHOL, HDL, LDLCALC, TRIG, CHOLHDL, LDLDIRECT in the last 72 hours. Thyroid Function Tests No results for input(s): TSH, T4TOTAL, T3FREE, THYROIDAB in the last 72 hours.  Invalid input(s): FREET3  Other results:  Imaging   Ct Chest High Resolution  Result Date: 06/20/2017 CLINICAL DATA:  Inpatient. Dyspnea. Clinical concern for interstitial lung disease. EXAM: CT CHEST WITHOUT CONTRAST TECHNIQUE: Multidetector CT imaging of the chest was performed following the standard protocol without intravenous contrast. High resolution imaging of the lungs, as well as inspiratory and expiratory imaging, was performed. COMPARISON:  Chest radiograph from earlier today. 10/04/2013 chest CT angiogram. FINDINGS: Motion degraded scan. Cardiovascular: Moderate cardiomegaly. No significant pericardial fluid/thickening. Left anterior descending, left circumflex and right coronary atherosclerosis. Two lead right subclavian pacemaker is noted with lead tips in the right atrium and right ventricular apex. Atherosclerotic thoracic aorta with ectatic 4.0 cm ascending thoracic aorta, stable. Dilated main pulmonary artery (4.0 cm diameter, increased. Mediastinum/Nodes: No discrete thyroid nodules. Unremarkable esophagus. No axillary adenopathy. Newly mildly enlarged 1.5 cm subcarinal node (series 3/image 70). No additional pathologically enlarged mediastinal or discrete hilar nodes on this noncontrast scan. Lungs/Pleura: No pneumothorax. Small dependent bilateral pleural effusions. Focal narrowing of the midthoracic tracheal lumen by presumed mucoid debris (series 6/ image 30) . Moderate compressive atelectasis in the dependent lower lobes bilaterally. Prominent patchy ground-glass attenuation and interlobular septal thickening throughout the aerated portions of the lungs bilaterally. Otherwise no acute consolidative airspace disease, lung masses or significant pulmonary nodules in the aerated  portions of the lungs. No significant regions of traction bronchiectasis or architectural distortion. No frank honeycombing. No significant air trapping on the expiration sequence. Upper abdomen: No acute abnormality. Musculoskeletal: No aggressive appearing focal osseous lesions. Mild thoracic spondylosis. IMPRESSION: 1. No findings suggestive of interstitial lung disease on this limited motion degraded scan. 2. Spectrum of findings compatible with congestive heart failure. Moderate cardiomegaly. Small dependent bilateral pleural effusions. Diffuse patchy ground-glass attenuation and interlobular septal thickening compatible with pulmonary edema. 3. Moderate bilateral lower lobe atelectasis. 4. Focal narrowing of the midthoracic trachea lumen by presumed mucoid debris. 5. New mild mediastinal lymphadenopathy, nonspecific, probably reactive. 6. Three-vessel coronary atherosclerosis. 7. Ectatic 4.0 cm ascending thoracic aorta. Recommend annual imaging followup by CTA or MRA. This recommendation follows 2010 ACCF/AHA/AATS/ACR/ASA/SCA/SCAI/SIR/STS/SVM Guidelines for the Diagnosis and Management of Patients with Thoracic Aortic Disease. Circulation. 2010; 121: Q945-W388. Aortic Atherosclerosis (ICD10-I70.0). Electronically Signed   By: Ilona Sorrel M.D.   On: 06/20/2017 16:51   Dg Chest Portable 1 View  Result Date: 06/21/2017 CLINICAL DATA:  Suspect pulmonary edema. EXAM: PORTABLE CHEST 1 VIEW COMPARISON:  Chest x-ray and chest CT scan of June 20, 2017 FINDINGS: The lungs are adequately inflated. The interstitial markings remain increased. The cardiac silhouette is enlarged. The pulmonary vascularity is engorged. There is tortuosity of the descending thoracic aorta. The ICD is in stable position. IMPRESSION: CHF with mild interstitial edema. Cardiomegaly. Thoracic aortic atherosclerosis. No significant change since yesterday's  study. Electronically Signed   By: David  Jordan M.D.   On: 06/21/2017 10:24     Medications:    Scheduled Medications: . amLODipine  10 mg Oral Daily  . atorvastatin  40 mg Per Tube Daily  . cholecalciferol  1,000 Units Oral Daily  . famotidine  20 mg Per Tube Daily  . heparin  5,000 Units Subcutaneous Q8H  . hydrALAZINE  25 mg Oral Q8H  . insulin aspart  0-20 Units Subcutaneous TID WC  . insulin aspart  0-5 Units Subcutaneous QHS  . insulin aspart  10 Units Subcutaneous TID WC  . insulin glargine  60 Units Subcutaneous BID  . mouth rinse  15 mL Mouth Rinse BID  . methylPREDNISolone (SOLU-MEDROL) injection  40 mg Intravenous Q12H  . metolazone  2.5 mg Oral Once  . potassium chloride  40 mEq Oral BID  . protein supplement shake  2 oz Oral BID BM  . rocuronium  100 mg Intravenous Once  . torsemide  100 mg Oral BID    Infusions: . sodium chloride 10 mL/hr at 06/16/17 0506  . levofloxacin (LEVAQUIN) IV Stopped (06/20/17 1531)    PRN Medications: acetaminophen, hydrALAZINE, ondansetron (ZOFRAN) IV, senna-docusate  Patient Profile   Mr Scheidegger is a 71 year old with history of chronic diastolic heart failure complicated by RV failure, CKD Stage III, symptomatic bradycardia, S/P PPM, ICH 2015, PE 2015, OSA, DM  CAD nonobstructive LHC 2006, morbid obesity, and blindness.   Admitted with acute respiratory failure.  Intubated in the ED.   Assessment/Plan   1. Acute hypoxemic respiratory failure:  Initially on Bipap but later intubated => extubated 11/1.  As stated yesterday, infiltrates on CXR have seemed out of proportion to volume status. ESR 100. Very suspicious for amiodarone toxicity - Amio stopped 11/3. Now on high-dose steroids (methylprednisolone). Seems to be improving slightly - High res CT with no evidence of ILD. It did show evidence of CHF, Moderately bilateral lower lobe atelextasis, muocous in the trachea, likely reactive mediastinal lymphadenopathy, 3V coronary atherosclerosis, and Ectatic 4.0 ascending thoracic aorta.  - PNA less likely,  PCT not significantly elevated.   2. Acute on chronic diastolic CHF: Echo (10/18) with EF 60-65%, moderate AS.  He has been diuresed aggressively.  - Volume status difficult but appears improved.  CXR today with mild edema. No significant change from yesterday.  - Creatinine stable.   - Continue torsemide 100 mg BID.  - Previously on metolazone need 2-3 times/week. May need to restart in the next day or two.  - Watch renal function closely 3. AKI on CKD Stage III:  - Creatinine stable at 2.07 4. H/O PE 2015: Venous dopplers negative this admission. No change.  5. ID: ?Viral PNA or pneumonitis.  Doubt bacterial PNA, PCT not significantly elevated.  - Switched to Levaquin on 11/3 to get atypical coverage as well -> complete 5 days, today 4/5.  - WBCs mildly higher with steroids, but now improved.  6. H/O Symptomatic Bradycardia: PPM. Stable.  7. H/O ICH 2015: Not anticoagulated.  No change.  8. LLE Cellulitis: Resolved. Continued abx. No change.  9. HTN:   - remains elevated. Increase hydralazine to 37.5 mg TID.  10. Atrial fibrillation: Paroxysmal.   - Not anticoagulated given history of intracerebral hemorrhage. NSR on telemetry.   - Stopped amio with ? toxicity 11. Aortic stenosis:  - Moderate on Echo 06/14/17.   Length of Stay: 8  Michael Andrew Tillery, PA-C  06/21/2017, 10:44 AM    Advanced Heart Failure Team Pager [phone] (M-F; 7a - 4p)  Please contact [facility] for night-coverage after hours (4p -7a ) and weekends on [email or web address]  Patient seen with PA, agree with the above note.  He is now on po torsemide 100 mg bid.  Will follow UOP and weight, may need to restart metolazone in the next day or two.  Will likely need 2-3 times/week (was taking daily prior to admission but would like to avoid this).    CT chest did not show interstitial lung disease, looked more like pulmonary edema.  No definite amiodarone lung toxicity, pulmonary to taper steroids.      06/21/2017 11:40 AM   

## 2017-06-22 ENCOUNTER — Encounter: Payer: Medicare (Managed Care) | Admitting: Cardiovascular Disease

## 2017-06-22 LAB — GLUCOSE, CAPILLARY
GLUCOSE-CAPILLARY: 303 mg/dL — AB (ref 65–99)
GLUCOSE-CAPILLARY: 301 mg/dL — AB (ref 65–99)
GLUCOSE-CAPILLARY: 218 mg/dL — AB (ref 65–99)
Glucose-Capillary: 224 mg/dL — ABNORMAL HIGH (ref 65–99)

## 2017-06-22 LAB — CBC WITH DIFFERENTIAL/PLATELET
Basophils Absolute: 0 10*3/uL (ref 0.0–0.1)
Basophils Relative: 0 %
EOS PCT: 0 %
Eosinophils Absolute: 0 10*3/uL (ref 0.0–0.7)
HCT: 42 % (ref 39.0–52.0)
Hemoglobin: 13.2 g/dL (ref 13.0–17.0)
LYMPHS ABS: 1.6 10*3/uL (ref 0.7–4.0)
LYMPHS PCT: 11 %
MCH: 25.7 pg — AB (ref 26.0–34.0)
MCHC: 31.4 g/dL (ref 30.0–36.0)
MCV: 81.7 fL (ref 78.0–100.0)
MONO ABS: 0.3 10*3/uL (ref 0.1–1.0)
MONOS PCT: 2 %
Neutro Abs: 13.1 10*3/uL — ABNORMAL HIGH (ref 1.7–7.7)
Neutrophils Relative %: 87 %
PLATELETS: 178 10*3/uL (ref 150–400)
RBC: 5.14 MIL/uL (ref 4.22–5.81)
RDW: 15 % (ref 11.5–15.5)
WBC: 15.1 10*3/uL — ABNORMAL HIGH (ref 4.0–10.5)

## 2017-06-22 LAB — BASIC METABOLIC PANEL
Anion gap: 11 (ref 5–15)
BUN: 65 mg/dL — ABNORMAL HIGH (ref 6–20)
CHLORIDE: 95 mmol/L — AB (ref 101–111)
CO2: 28 mmol/L (ref 22–32)
Calcium: 9 mg/dL (ref 8.9–10.3)
Creatinine, Ser: 2 mg/dL — ABNORMAL HIGH (ref 0.61–1.24)
GFR calc Af Amer: 37 mL/min — ABNORMAL LOW (ref >60)
GFR calc non Af Amer: 32 mL/min — ABNORMAL LOW (ref >60)
GLUCOSE: 259 mg/dL — AB (ref 65–99)
POTASSIUM: 3.7 mmol/L (ref 3.5–5.1)
Sodium: 134 mmol/L — ABNORMAL LOW (ref 135–145)

## 2017-06-22 MED ORDER — HYDRALAZINE HCL 50 MG PO TABS
50.0000 mg | ORAL_TABLET | Freq: Three times a day (TID) | ORAL | Status: DC
  Administered 2017-06-22 – 2017-06-24 (×8): 50 mg via ORAL
  Filled 2017-06-22 (×7): qty 1

## 2017-06-22 NOTE — Progress Notes (Signed)
Patient ID: [name], male   DOB: Mar 06, 1946, 71 y.o.   MRN: [id number]     Advanced Heart Failure Rounding Note  Primary Cardiologist: [name]   Subjective:    Admitted with acute respiratory failure requiring intubation.   Intubated initially, extubated on 11/1.  He has had diffuse bilateral interstitial infiltrates by CXR despite significant diuresis (minimal improvement).  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  He is now getting methylprednisolone IV.    Transitioned to po torsemide 06/20/17.   On BiPAP from overnight still. Denies SOB. Feels like his breathing is close to baseline.   Negative 2.4 L and down less than a lb.   Echo 06/14/17 LVEF 60-65%, Moderate AS  High Res Chest CT 06/20/17 1. No findings suggestive of interstitial lung disease on this limited motion degraded scan. 2. Spectrum of findings compatible with congestive heart failure. Moderate cardiomegaly. Small dependent bilateral pleural effusions. Diffuse patchy ground-glass attenuation and interlobular septal thickening compatible with pulmonary edema. 3. Moderate bilateral lower lobe atelectasis. 4. Focal narrowing of the midthoracic trachea lumen by presumed mucoid debris. 5. New mild mediastinal lymphadenopathy, nonspecific, probably reactive. 6. Three-vessel coronary atherosclerosis. 7. Ectatic 4.0 cm ascending thoracic aorta. Recommend annual imaging followup by CTA or MRA.   Objective:   Weight Range: (!) 322 lb 15.6 oz (146.5 kg) Body mass index is 43.8 kg/m.   Vital Signs:   Temp:  [97.2 F (36.2 C)-99 F (37.2 C)] 97.2 F (36.2 C) (11/07 0746) Pulse Rate:  [71-80] 72 (11/07 0416) Resp:  [14-25] 19 (11/07 0416) BP: (136-164)/(74-96) 153/88 (11/07 0416) SpO2:  [94 %-98 %] 95 % (11/07 0416) Weight:  [322 lb 15.6 oz (146.5 kg)] 322 lb 15.6 oz (146.5 kg) (11/07 0416) Last BM Date: 06/20/17 Weight change: Filed Weights   06/18/17 0300 06/21/17  0334 06/22/17 0416  Weight: (!) 317 lb 14.5 oz (144.2 kg) (!) 323 lb 3.1 oz (146.6 kg) (!) 322 lb 15.6 oz (146.5 kg)   Intake/Output:   Intake/Output Summary (Last 24 hours) at 06/22/2017 0828 Last data filed at 06/22/2017 0747 Gross per 24 hour  Intake 795 ml  Output 3650 ml  Net -2855 ml    Physical Exam    General: NAD, obese.  HEENT: Normal Neck: Supple. JVP difficult. Carotids 2+ bilat; no bruits. No thyromegaly or nodule noted. Cor: PMI nondisplaced. RRR, No M/G/R noted Lungs: Diminished but clear.  Abdomen: Soft, non-tender, non-distended, no HSM. No bruits or masses. +BS  Extremities: No cyanosis, clubbing, or rash. R and LLE no edema.  Neuro: Alert & orientedx3, cranial nerves grossly intact. moves all 4 extremities w/o difficulty. Affect pleasant   Telemetry   NSR 70-80s, personally reviewed.   EKG    N/A  Labs    CBC Recent Labs    06/21/17 0218 06/22/17 0220  WBC 15.3* 15.1*  NEUTROABS 14.1* 13.1*  HGB 13.1 13.2  HCT 42.1 42.0  MCV 83.4 81.7  PLT 201 831   Basic Metabolic Panel Recent Labs    06/21/17 0218 06/22/17 0220  NA 136 134*  K 3.8 3.7  CL 96* 95*  CO2 29 28  GLUCOSE 294* 259*  BUN 65* 65*  CREATININE 2.07* 2.00*  CALCIUM 9.1 9.0   Liver Function Tests No results for input(s): AST, ALT, ALKPHOS, BILITOT, PROT, ALBUMIN in the last 72 hours. No results for input(s): LIPASE, AMYLASE in the last 72 hours. Cardiac Enzymes No results for input(s): CKTOTAL, CKMB, CKMBINDEX, TROPONINI  in the last 72 hours.  BNP: BNP (last 3 results) Recent Labs    02/09/17 1155 03/23/17 1113 06/13/17 0805  BNP 47.5 78.0 158.5*   ProBNP (last 3 results) No results for input(s): PROBNP in the last 8760 hours.  D-Dimer No results for input(s): DDIMER in the last 72 hours. Hemoglobin A1C No results for input(s): HGBA1C in the last 72 hours. Fasting Lipid Panel No results for input(s): CHOL, HDL, LDLCALC, TRIG, CHOLHDL, LDLDIRECT in the last 72  hours. Thyroid Function Tests No results for input(s): TSH, T4TOTAL, T3FREE, THYROIDAB in the last 72 hours.  Invalid input(s): FREET3  Other results:  Imaging   No results found.  Medications:    Scheduled Medications: . amLODipine  10 mg Oral Daily  . atorvastatin  40 mg Per Tube Daily  . cholecalciferol  1,000 Units Oral Daily  . famotidine  20 mg Per Tube Daily  . heparin  5,000 Units Subcutaneous Q8H  . hydrALAZINE  37.5 mg Oral Q8H  . insulin aspart  0-20 Units Subcutaneous TID WC  . insulin aspart  0-5 Units Subcutaneous QHS  . insulin aspart  15 Units Subcutaneous TID WC  . insulin glargine  75 Units Subcutaneous BID  . mouth rinse  15 mL Mouth Rinse BID  . methylPREDNISolone (SOLU-MEDROL) injection  40 mg Intravenous Q12H  . potassium chloride  40 mEq Oral BID  . protein supplement shake  2 oz Oral BID BM  . rocuronium  100 mg Intravenous Once  . torsemide  100 mg Oral BID    Infusions: . sodium chloride 10 mL/hr at 06/16/17 0506  . 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   Admitted with acute respiratory failure.  Intubated in the ED.   Assessment/Plan   1. Acute hypoxemic respiratory failure:  Initially on Bipap but later intubated => extubated 11/1.  As stated yesterday, infiltrates on CXR have seemed out of proportion to volume status. ESR 100. Very suspicious for amiodarone toxicity - Amio stopped 11/3. Now on high-dose steroids (methylprednisolone). Seems to be improving slightly. Per notes plan to wean to oral steroids soon.  - High res CT with no evidence of ILD. It did show evidence of CHF,  Moderately bilateral lower lobe atelextasis, muocous in the trachea, likely reactive mediastinal lymphadenopathy, 3V coronary atherosclerosis, and Ectatic 4.0 ascending thoracic aorta.  - PNA less likely, PCT not significantly elevated.   2. Acute on chronic diastolic CHF: Echo (76/14) with EF 60-65%, moderate AS.  He has been diuresed aggressively.  - Volume status stable.  CXR 06/21/17 with mild edema. - Creatinine stable.    - Continue torsemide 100 mg BID. Do not think he needs metolazone today.  - Watch renal function closely 3. AKI on CKD Stage III:  - Creatinine stable at 2.00 4. H/O PE 2015: Venous dopplers negative this admission. No change.  5. ID: ?Viral PNA or pneumonitis.  Doubt bacterial PNA, PCT not significantly elevated.  - Switched to [place] on 11/3 to get atypical coverage as well -> complete 5 days, today 4/5.  - WBCs mildly higher with steroids, but now improved.  6. H/O Symptomatic Bradycardia: PPM. Stable.  7.  H/O ICH 2015: Not anticoagulated.  No change.   8. LLE Cellulitis: Resolved. Continued abx. No change.   9. HTN:   - Remains elevated.  - Increase hydralazine to 50 mg TID.  10. Atrial fibrillation: Paroxysmal.   - Not anticoagulated given history of intracerebral hemorrhage. NSR on telemetry.   - Stopped amio with ? Toxicity. Will not resume.  11. Aortic stenosis:  - Moderate on Echo 06/14/17. No change.   Length of Stay: [place], [place]  06/22/2017, 8:28 AM  Advanced Heart Failure Team Pager [phone] (M-F; 7a - 4p)  Please contact [facility] for night-coverage after hours (4p -7a ) and weekends on [email or web address]  Patient seen with PA, agree with the above note.   He is on 6L [place] today.  Breathing slowly improving.  I/Os net negative on torsemide 100 mg bid and creatinine stable. I do not think that he needs metolazone today, continue torsemide as ordered.  Volume status on exam appears fairly well optimized (though exam is difficult).  Creatinine is stable.   He was not on oxygen at home but suspect he may need now.  He is on CPAP at home.  Concern for amiodarone lung toxicity with elevated ESR, but pulmonary does not think CT appearance is consistent.  He is on steroids, pulmonary managing.  I will leave him off amiodarone.  He is in NSR.    BP remains high, increase hydralazine as above.   He will need SNF at discharge.   Loralie Champagne 06/22/2017 9:10 AM

## 2017-06-22 NOTE — Clinical Social Work Note (Signed)
CSW spoke with patient regarding SNF placement and provided bed offers: [name]. [facility] is still pending. Patient states he has been at [facility] in the past and it might be alright to return. CSW will continue to follow.  [name], [place]

## 2017-06-22 NOTE — Progress Notes (Addendum)
  Date: 06/22/2017  Patient name: [name]  Medical record number: [id number]  Date of birth: [birth year]   I have seen and evaluated this patient and I have discussed the plan of care with the house staff. Please see their note for complete details. I concur with their findings with the following additions/corrections: the etiology of [name]' hypoxia is not clear. Dr. Tarri Abernethy calculated AA gradient which was elevated despite being on a nonrebreather. This would indicate a shunt was etiology of his hypoxia. However, in 2015 he had a TEE that did not show any shunt pathology. Amiodarone toxicity has been considered that the patient's CT scan was not consistent with this etiology and steroids have been stopped. He has almost completed his antibiotic course for presumed pneumonia. His CT scan was consistent with pulmonary edema. His most recent echo, and October 2018, did not show any systolic or diastolic dysfunction. His echo in August 2018 did show grade 2 diastolic dysfunction. It is possible that the pulmonary edema, resulting from diastolic dysfunction, is the etiology of his hypoxia. Since admission, he has lost 21 pounds and is now -16 L. He is not on his home dose of Demadex with intermittent doses of metolazone as needed. He is still oxygen dependent today. Physical therapy has recommended SNF which we will arrange  [name], MD 06/22/2017, 1:08 PM

## 2017-06-22 NOTE — Progress Notes (Signed)
   Subjective: On CPAP this AM with oxygen saturations of 95%. States that he is feeling good just weak. He was able to stand up yesterday and sit in the chair. Discussed going to a SNF for rehab and he agrees. He is tolerating PO intake. Denies pain. Says his breathing has improved tremendously. We discussed the plan to continue his antibiotics, work on finding a SNF, and do some additional testing (ambulatory pulse ox). He agrees with the plan. All questions and concerns addressed.   Objective: Vital signs in last 24 hours: Vitals:   06/21/17 1700 06/21/17 1914 06/21/17 2321 06/22/17 0416  BP: (!) 157/84 (!) 154/86 (!) 164/96 (!) 153/88  Pulse: 78 80 77 72  Resp: 14 (!) 25 19 19   Temp: 98.7 F (37.1 C) 98.6 F (37 C) 99 F (37.2 C) 97.8 F (36.6 C)  TempSrc: Axillary Oral Axillary Axillary  SpO2: 96% 94% 98% 95%  Weight:    (!) 322 lb 15.6 oz (146.5 kg)  Height:       General: Morbidly obese male resting comfortably on CPAP Pulm: Difficult to auscultate but appears to have good air movement  CV: Distant heart sounds, RRR, systolic murmur Abdomen: Active bowel sounds, soft, no tenderness to palpation  Extremities: No LE edema, bandage on the left LE changed on 11/6 is dry and clean, lichenification of the bilateral LE  Assessment/Plan:  1. Acute Hypoxic Respiratory Failure  - DDx includes bacterial/viral PNA, ARDS, heart failure/volume overload, pneumonitis (aspiration? Unlikely given bilateral),PE (less likely) - Patient is negative 15 L since admission and weight has decreased approximately 20 lbs. Continue diuresing per cardiology - Leukocytosis trending down  - Continue Levofloxacin 750 mg every 48 hours (2/3 doses) - Stopping Solu-medrol today  - PT recommending SNF, social work consult placement - Ambulatory pulse oximetry to determine need for home oxygen   2. Acute on Chronic Heart Failure  - Cardiology following diuresing with torsemide 100 mg bid   - Discontinued  Amiodaroneon 11/3.  - Creatinine stable  3. AKI on CKD Stage III  - Creatininestable at 2.00today  - Continue to monitor in the setting of aggressive diureses   4. Left Lower Extremity Cellulitis  - Bandage is dry, continue dressing changes PRN - PE consistent with LE stasis dermatitis  -Adequate coverage with Levofloxacin  5. Paroxysmal Atrial Fibrillation  - Previously rhythm controlled with Amiodarone  - Discontinued Amiodarone  - Not on anticoagulation due to history of GI bleed  6. Diabetes Mellitus  -Currently on Lantus 75 units BID, Novolog 15 units TID WC, and Resistant SSI with meals  - Received 221 Units of insulin yesterday  - CBGs should improve with the discontinuation of steroid therapy   7. Hypertension  -Continuing Amlodipine and Hydralazine  Dispo: Anticipated discharge pending clinical improvement.   Ina Homes, MD 06/22/2017, 5:01 AM My Pager: [phone]

## 2017-06-22 NOTE — Progress Notes (Signed)
PULMONARY / CRITICAL CARE MEDICINE   Name: [name] MRN: [id number] DOB: [birth year]    ADMISSION DATE:  06/13/2017 CONSULTATION DATE:  06/13/17  REFERRING MD:  Dr. Vanita Panda / EDP   CHIEF COMPLAINT:  Shortness of Breath   HISTORY OF PRESENT ILLNESS:  71 y/o M who presented to [facility] on 10/29 with reports of acute shortness of breath.    The patient is on BiPAP.  Information obtained from patient (as able), chart review and family at bedside.  The patient lives at home with the assistance of home health aides.  They are in/out of his home frequently during the day.  They assist him with all aspects of care.  In [facility].  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 Family reports that some of the aides are good about keeping him to a low sodium diet, others are not.  They are unsure if he actually follows his fluid restriction.  Daughter thinks he was recently taken off a "blood thinner" but she is unsure (none listed on home medication list).  He does carry a hx of PE and prior anticoagulation. Family (sister, daughter and brother in law deny knowledge of him ever smoking). They state he has been on the vent before but are unsure if he would want it again.   He was recently seen in the ER on 10/23 for dizziness and vomiting that resolved with meclizine and zofran. The patient denies known sick contacts. EMS was called for acute shortness of breath.  He was placed on CPAP and transported to the ER.  In the ER, he was found to be hypoxic requiring BiPAP 16/8 with 100% FiO2 (he dropped to the 80's on 80% O2 per RT).  Initial CXR shows concern for pulmonary edema with pleural effusion.  Initial labs- Na 135, K 5.1, CL 90, CO2 30, BUN 83, CR 3.30 (up from 2.49 on 10/23, baseline 2.1-2.6 in the last year), glucose 166, AST 78 / ALT 97, T.Bili 1.8, Troponin <0.03, WBC 18.2, Hgb 14.3 and platelets 192.  He is unable to describe further symptoms  due to BiPAP mask.   He is followed by the CHF clinic and was most recently seen on 03/23/17 by Oda Kilts, PA-C.  At that time his office weight was 339 lbs.  In 12/2016 he was 329lbs.  On 10/23 in the ER he was 340 lbs.    SUBJECTIVE:  Leave off BiPAP.  No acute distress.  He notes that he wears CPAP and is oxygen dependent at home for the last 5 years.  He will most likely need skilled nursing facility at the time of discharge.   VITAL SIGNS: BP (!) 158/89 (BP Location: Left Arm)   Pulse 72   Temp (!) 97.2 F (36.2 C) (Oral)   Resp 19   Ht 6' (1.829 m)   Wt (!) 322 lb 15.6 oz (146.5 kg)   SpO2 95%   BMI 43.80 kg/m   HEMODYNAMICS:    VENTILATOR SETTINGS:  Nocturnal BiPAP  INTAKE / OUTPUT: I/O last 3 completed shifts: In: 795 [P.O.:795] Out: 3600 [Urine:3600]  PHYSICAL EXAMINATION: General: Obese African-American male.  Currently off BiPAP.  Remains on O2 at 5 L.  He is in no acute distress at this time. HEENT: No JVD or lymphadenopathy appreciated PSY: Normal affect Neuro: Follows commands moves all extremities x4 he is blind. CV: Currently in sinus  rhythm.  His pulse is 72 systolic blood pressure is 885 diastolic is 89 PULM:decreased breath sounds in the base. OY:DXAJ, non-tender, bsx4 active obese Extremities: warm/dry, 3+ edema lower extremity ulcer noted Skin: Warm and dry   LABS:  BMET Recent Labs  Lab 06/20/17 0300 06/21/17 0218 06/22/17 0220  NA 138 136 134*  K 3.7 3.8 3.7  CL 96* 96* 95*  CO2 30 29 28   BUN 66* 65* 65*  CREATININE 2.17* 2.07* 2.00*  GLUCOSE 341* 294* 259*   Electrolytes Recent Labs  Lab 06/16/17 0328 06/17/17 0500 06/18/17 0343  06/20/17 0300 06/21/17 0218 06/22/17 0220  CALCIUM 8.7* 8.9 9.1   < > 9.2 9.1 9.0  MG 2.0 2.0 2.1  --   --   --   --   PHOS 3.4 4.1 3.9  --   --   --   --    < > = values in this interval not displayed.   CBC Recent Labs  Lab 06/20/17 0300 06/21/17 0218 06/22/17 0220  WBC 18.5* 15.3* 15.1*   HGB 13.1 13.1 13.2  HCT 42.6 42.1 42.0  PLT 204 201 178   Coag's Recent Labs  Lab 06/15/17 2207 06/19/17 0247  APTT 31 30  INR 1.20 1.28    Sepsis Markers Recent Labs  Lab 06/18/17 1342 06/19/17 0247 06/20/17 0300  PROCALCITON 0.40 0.30 0.29    ABG Recent Labs  Lab 06/17/17 0631 06/18/17 0440 06/18/17 1125  PHART 7.474* 7.442 7.463*  PCO2ART 54.0* 52.0* 50.7*  PO2ART 87.0 63.3* 79.8*    Liver Enzymes No results for input(s): AST, ALT, ALKPHOS, BILITOT, ALBUMIN in the last 168 hours.  Cardiac Enzymes No results for input(s): TROPONINI, PROBNP in the last 168 hours.  Glucose Recent Labs  Lab 06/20/17 2105 06/21/17 0811 06/21/17 1329 06/21/17 1658 06/21/17 2107 06/22/17 0743  GLUCAP 343* 294* 332* 354* 379* 224*    Imaging No results found.  STUDIES:  ECHO 10/29 >> EF 60-65%, IVC dilated. BLE Venous Duplex 10/29 >> neg PCXR 11/1 > edema CXR 11/3>>Worsening pulmonary edema. Unchanged cardiomegaly and pleural effusions HRCT 11/5>> no signs of interstitial lung disease + small dependent bilateral pleural effusions, diffuse patchy ground-glass attenuation, and interlobular septal thickening compatible with pulmonary edema. Shunting physiology is the most likely explanation .   CULTURES: RVP 10/29 >> neg  ANTIBIOTICS: Keflex 10/30 > Levaquin 11/3>>   SIGNIFICANT EVENTS: 10/29  Admit with acute SOB  LINES/TUBES: ETT 10/29 > 10/31 >10/31>11/1 L IJ CVL 10/31 > 06/16/2017     DISCUSSION: 71 y/o M with a complex medical history admitted 10/29 with acute onset SOB.  Suspect decompensated diastolic CHF with viral illness, PE also on differential (given high O2 needs), as well as amiodarone toxicity. HRCT was negative for ILD, but + for small dependent bilateral pleural effusions, diffuse patchy ground-glass attenuation, and interlobular septal thickening compatible with pulmonary edema. Shunting physiology is the most likely explanation .DDx includes  AVMs, bacterial/viral PNA, ARDS, heart failure/volume overload, pneumonitis (Amiodarone /  aspiratation) Improved 06/22/2017.  Note is on O2 at 5 L bleeding at home.  Is also CPAP dependent for his obstructive sleep apnea.  He will most likely need skilled nursing facility at time of discharge.  ASSESSMENT / PLAN:  PULMONARY A: Acute Hypoxic Respiratory Failure - in setting of AKI, decompensated CHF, r/o PNA, viral illness, PE OSA - baseline CPAP 12cm H2O Hx PE in 2015 - not on anticoagulation due to rectal bleeding Net negative 12.8 L  since admission Negative 1100 last 24 hours Concern for Amiodarone pneumonitis/ Aspiration pneumonitis P:   bipap qhs and PRN , note on cpap and O2 5 l/m bleed in at home. Transition to cpap Titrate O2 for sat of 88-92%  Aggressive Pulmonary hygiene  Mobilize as able Hold amio/ ? amio pneumonitis HRCT negative for ILD, but  Does not rule out possible amio pneumonitis>> slow  taper steroids per primary team CXR prn   CARDIOVASCULAR A:  Diastolic CHF with concern for Acute Decompensation Hx HTN, HLD, CAD, PAF (not on anticoagulation due to rectal bleeding), bradycardia s/p pacemaker  negative 1/0 since admission P:  CHF Team following Continue to hold amiodarone  Diuresis as renal function allows:  torsemide 100 mg bid per CHF team Continue preadmission amlodipine, aspirin, atorvastatin, hydralazine Strict I&O   RENAL Lab Results  Component Value Date   CREATININE 2.00 (H) 06/22/2017   CREATININE 2.07 (H) 06/21/2017   CREATININE 2.17 (H) 06/20/2017   CREATININE 2.28 (H) 04/23/2013   CREATININE 0.97 10/22/2010   Recent Labs  Lab 06/20/17 0300 06/21/17 0218 06/22/17 0220  K 3.7 3.8 3.7     Intake/Output Summary (Last 24 hours) at 06/22/2017 0947 Last data filed at 06/22/2017 0747 Gross per 24 hour  Intake 795 ml  Output 3650 ml  Net -2855 ml    A:   AKI on CKD - baseline sr cr 2.1-2.6  Hypokalemia  Metabolic Alkalosis with  compensated respiratory acidosis Creatinine stable  P:   Monitor chem with aggressive  diuresis  Replace electrolytes as indicated Avoid nephrotoxic agents, ensure adequate renal perfusion  Trend BMP daily Strict I&O  GASTROINTESTINAL A:   Morbid Obesity  Mild Elevation of LFT's - 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  P:   Diet as tolerated per IM Pepcid   HEMATOLOGIC A:   DVT prophylaxis Hx of PE - LE duplex from 10/30 neg Coags 11/4>> PT 15.9;  INR 1.28;  APTT 30 P:  SCD on right leg (no left leg given cellulitis) Continue SQ heparin  Trend CBC daily Transfuse for HGB < 7 Monitor coags prn  INFECTIOUS A:   Possible LLE cellulitis Rule Out Viral Illness, PNA - RVP neg 10/29 Leukocytosis on steroids PCT 11/3>> 0.30 T Max 99.3 last 24 P:   Continue Levaquin day 5/5 Trend fever curve and WBC Follow micro Culture as is clinically indicated  ENDOCRINE CBG (last 3)  Recent Labs    06/21/17 1658 06/21/17 2107 06/22/17 0743  GLUCAP 354* 379* 224*    A:   DM - with CKD, resultant blindness  Elevated glucose on steroids  Poorly controlled P:   Hold home oral agents SSI Q4 with resistant scale Consult to Diabetes Coordinator per primary team noted  NEUROLOGIC A:   Acute Metabolic Encephalopathy  P:    Blind but awake and alert 11/7  FAMILY  Updates:No family at bedside 11/6.  *IM teaching service primary.    Richardson Landry Minor ACNP Maryanna Shape PCCM Pager [phone] till 1 pm If no answer page [phone] 06/22/2017, 9:41 AM

## 2017-06-23 LAB — CBC WITH DIFFERENTIAL/PLATELET
Basophils Absolute: 0 10*3/uL (ref 0.0–0.1)
Basophils Relative: 0 %
EOS ABS: 0 10*3/uL (ref 0.0–0.7)
EOS PCT: 0 %
HCT: 42 % (ref 39.0–52.0)
Hemoglobin: 13.6 g/dL (ref 13.0–17.0)
LYMPHS ABS: 2.8 10*3/uL (ref 0.7–4.0)
LYMPHS PCT: 14 %
MCH: 26.1 pg (ref 26.0–34.0)
MCHC: 32.4 g/dL (ref 30.0–36.0)
MCV: 80.6 fL (ref 78.0–100.0)
MONO ABS: 0.5 10*3/uL (ref 0.1–1.0)
Monocytes Relative: 2 %
Neutro Abs: 16.8 10*3/uL — ABNORMAL HIGH (ref 1.7–7.7)
Neutrophils Relative %: 84 %
PLATELETS: 196 10*3/uL (ref 150–400)
RBC: 5.21 MIL/uL (ref 4.22–5.81)
RDW: 15 % (ref 11.5–15.5)
WBC: 20.1 10*3/uL — ABNORMAL HIGH (ref 4.0–10.5)

## 2017-06-23 LAB — BASIC METABOLIC PANEL
Anion gap: 11 (ref 5–15)
BUN: 61 mg/dL — AB (ref 6–20)
CHLORIDE: 95 mmol/L — AB (ref 101–111)
CO2: 29 mmol/L (ref 22–32)
CREATININE: 2.01 mg/dL — AB (ref 0.61–1.24)
Calcium: 8.9 mg/dL (ref 8.9–10.3)
GFR calc Af Amer: 37 mL/min — ABNORMAL LOW (ref >60)
GFR calc non Af Amer: 32 mL/min — ABNORMAL LOW (ref >60)
GLUCOSE: 76 mg/dL (ref 65–99)
POTASSIUM: 3.5 mmol/L (ref 3.5–5.1)
SODIUM: 135 mmol/L (ref 135–145)

## 2017-06-23 LAB — GLUCOSE, CAPILLARY
GLUCOSE-CAPILLARY: 52 mg/dL — AB (ref 65–99)
GLUCOSE-CAPILLARY: 225 mg/dL — AB (ref 65–99)
GLUCOSE-CAPILLARY: 194 mg/dL — AB (ref 65–99)
GLUCOSE-CAPILLARY: 75 mg/dL (ref 65–99)
Glucose-Capillary: 76 mg/dL (ref 65–99)

## 2017-06-23 MED ORDER — METOPROLOL SUCCINATE ER 50 MG PO TB24
50.0000 mg | ORAL_TABLET | Freq: Every day | ORAL | Status: DC
  Administered 2017-06-23 – 2017-06-24 (×2): 50 mg via ORAL
  Filled 2017-06-23 (×2): qty 1

## 2017-06-23 MED ORDER — INSULIN GLARGINE 100 UNIT/ML ~~LOC~~ SOLN
65.0000 [IU] | Freq: Two times a day (BID) | SUBCUTANEOUS | Status: DC
  Administered 2017-06-23 – 2017-06-24 (×3): 65 [IU] via SUBCUTANEOUS
  Filled 2017-06-23 (×4): qty 0.65

## 2017-06-23 MED ORDER — ATORVASTATIN CALCIUM 40 MG PO TABS
40.0000 mg | ORAL_TABLET | Freq: Every day | ORAL | Status: DC
  Administered 2017-06-24: 40 mg via ORAL
  Filled 2017-06-23: qty 1

## 2017-06-23 NOTE — Progress Notes (Signed)
RT placed pt on CPAP with pressure 10 and 3L bled in. RT refilled water chamber with sterile water and Pt vitals stable and pt is  tolerating well. Rt to monitor as needed.

## 2017-06-23 NOTE — Clinical Social Work Note (Addendum)
CSW left voicemail for daughter. Will notify her that patient is agreeable to [facility]. She will need to bring the patient's home cpap machine to the facility. CSW will facilitate admission to the facility once she calls back.  Sarah Boswell, CSW [phone]  12:45 pm CSW spoke with patient's daughter and informed her that per notes, patient is stable for discharge to SNF today. Patient's daughter very upset about this, stating that she needs to discuss facility options with her family that lives in [place]. She had planned to do this, this weekend. CSW explained that once insurance sees that he is stable for discharge, they will stop payment. Patient's daughter voiced concerns about staff providing her father with "dignity and respect" in preparing for discharge. Patient's daughter concerned with him transferring without any supports available since he is blind. She states she has called every day and no one has indicated that he was nearing discharge. She states she plans to voice a complaint. CSW offered to have MD call her. She is agreeable and asked that CSW send her a text message with facilities that have extended bed offers: [facility] and [facility]. [facility] is still pending. CSW made MD aware of concerns and he stated he would contact her.  Sarah Boswell, CSW [phone]  2:37 pm Per MD, plan for discharge tomorrow. CSW received call from patient's daughter requesting letter for work stating that her father is in the hospital. She will send CSW her email address. She plans to leave to come to town tonight. CSW received call from RN at [facility] who stated she met with the patient and his sister and patient stated he definitely did not want to return to [facility]. CSW notified RN of discrepancy from yesterday's conversation and that his only other bed offer is [facility]. CSW left message for admissions coordinator at [facility] to see if they would be able to extend a bed offer.  Sarah  Boswell, CSW [phone]  3:32 pm [facility] is able to extend a bed offer. CSW left message for patient's daughter to notify.  Sarah Boswell, CSW [phone]  

## 2017-06-23 NOTE — Progress Notes (Addendum)
Inpatient Diabetes Program Recommendations  AACE/ADA: New Consensus Statement on Inpatient Glycemic Control (2015)  Target Ranges:  Prepandial:   less than 140 mg/dL      Peak postprandial:   less than 180 mg/dL (1-2 hours)      Critically ill patients:  140 - 180 mg/dL   Lab Results  Component Value Date   GLUCAP 76 06/23/2017   HGBA1C 7.0 (H) 01/05/2017    Review of Glycemic ControlResults for BRYSIN, TOWERY (MRN [id number]) as of 06/23/2017 09:41  Ref. Range 06/22/2017 11:58 06/22/2017 16:52 06/22/2017 21:06 06/23/2017 08:09 06/23/2017 08:45  Glucose-Capillary Latest Ref Range: 65 - 99 mg/dL 303 (H) 301 (H) 218 (H) 52 (L) 76   Diabetes history:Type 2 DM Outpatient Diabetes medications:Toujeo 85 units bid, Victoza 1.8 mg daily Current orders for Inpatient glycemic control: Lantus 75 units daily, Novolog resistant tid with meals and HS, Novolog 15 units tid with meals Steroids stopped Inpatient Diabetes Program Recommendations:   Please consider reducing Lantus to 65 units bid. Called and discussed with MD.  Verbal orders received.    Thanks, Adah Perl, RN, BC-ADM Inpatient Diabetes Coordinator Pager [phone] (8a-5p)

## 2017-06-23 NOTE — Progress Notes (Signed)
Physical Therapy Treatment Patient Details Name: [name] MRN: [id number] DOB: June 10, 1946 Today's Date: 06/23/2017    History of Present Illness Pt adm with acute respiratory failure due to decompensated diastolic heart failure. Pt aslo with AKI on CKD. Intubated 10/29-11/1. PMH - blindness, lt transmetatarsal amputation, ckd, morbid obesity, dm, htn, pacer, cad, blind    PT Comments    Pt able to get to chair using Stedy. Pt making steady progress. Continue to recommend ST-SNF.   Follow Up Recommendations  SNF     Equipment Recommendations  None recommended by PT    Recommendations for Other Services       Precautions / Restrictions Precautions Precautions: Fall Precaution Comments: blind Restrictions Weight Bearing Restrictions: No    Mobility  Bed Mobility Overal bed mobility: Needs Assistance Bed Mobility: Supine to Sit     Supine to sit: Mod assist;+2 for physical assistance     General bed mobility comments: Assist to bring legs off of bed, elevate trunk into sitting and bring hips to EOB.  Transfers Overall transfer level: Needs assistance Equipment used: Ambulation equipment used Transfers: Sit to/from [facility] Sit to Stand: +2 physical assistance;Mod assist;From elevated surface Stand pivot transfers: (Use of Stedy)       General transfer comment: Assist to bring hips and trunk up from elevated surface. Unable to rise from low surface.  Ambulation/Gait                 Stairs            Wheelchair Mobility    Modified Rankin (Stroke Patients Only)       Balance Overall balance assessment: Needs assistance Sitting-balance support: Feet supported;Bilateral upper extremity supported Sitting balance-Leahy Scale: Poor Sitting balance - Comments: UE assist to sit EOB   Standing balance support: Bilateral upper extremity supported Standing balance-Leahy Scale: Poor Standing balance comment: Stedy and min  assist for static standing                            Cognition Arousal/Alertness: Awake/alert Behavior During Therapy: Flat affect Overall Cognitive Status: Impaired/Different from baseline Area of Impairment: Orientation                 Orientation Level: Disoriented to;Time Current Attention Level: Sustained   Following Commands: Follows one step commands consistently     Problem Solving: Slow processing        Exercises      General Comments        Pertinent Vitals/Pain Pain Assessment: Faces Faces Pain Scale: Hurts little more Pain Location: legs with movement to EOB Pain Descriptors / Indicators: Grimacing;Moaning Pain Intervention(s): Limited activity within patient's tolerance;Monitored during session;Repositioned    Home Living                      Prior Function            PT Goals (current goals can now be found in the care plan section) Progress towards PT goals: Progressing toward goals    Frequency    Min 2X/week      PT Plan Current plan remains appropriate;Frequency needs to be updated    Co-evaluation              AM-PAC PT "6 Clicks" Daily Activity  Outcome Measure  Difficulty turning over in bed (including adjusting bedclothes, sheets and blankets)?: Unable Difficulty moving from lying on back  to sitting on the side of the bed? : Unable Difficulty sitting down on and standing up from a chair with arms (e.g., wheelchair, bedside commode, etc,.)?: Unable Help needed moving to and from a bed to chair (including a wheelchair)?: A Lot Help needed walking in hospital room?: Total Help needed climbing 3-5 steps with a railing? : Total 6 Click Score: 7    End of Session Equipment Utilized During Treatment: Oxygen;Gait belt Activity Tolerance: Patient tolerated treatment well Patient left: with call bell/phone within reach;in chair;with chair alarm set;with family/visitor present Nurse Communication:  Mobility status;Need for lift equipment PT Visit Diagnosis: Other abnormalities of gait and mobility (R26.89);Muscle weakness (generalized) (M62.81);Difficulty in walking, not elsewhere classified (R26.2)     Time: 5929-2446 PT Time Calculation (min) (ACUTE ONLY): 34 min  Charges:  $Therapeutic Activity: 23-37 mins                    G Codes:       [facility] PT [place] 06/23/2017, 11:32 AM

## 2017-06-23 NOTE — Progress Notes (Signed)
Hypoglycemic Event  CBG: 52  Treatment: orange juice   Symptoms: None: per nurse tech morning BG check  Follow-up CBG: Time: 8:45am  CBG Result: 76  Possible Reasons for Event:  Comments/MD notified: Yes    [name]  [name]

## 2017-06-23 NOTE — Progress Notes (Signed)
Patient ID: [name], male   DOB: 1946/01/25, 71 y.o.   MRN: [id number]     Advanced Heart Failure Rounding Note  Primary Cardiologist: Dr. Aundra Dubin   Subjective:    Admitted with acute respiratory failure requiring intubation.   Intubated initially, extubated on 11/1.  He has had diffuse bilateral interstitial infiltrates by CXR despite significant diuresis (minimal improvement).  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  He is now getting methylprednisolone IV.    Transitioned to po torsemide 06/20/17.   Feeling better, near baseline. Getting read to work with Physical Therapy.  Not previously on O2 at home.   Negative 2.4 L but weight up 2 lbs (bed weights). Plan to resume metolazone for home, but not in hospital.   Echo 06/14/17 LVEF 60-65%, Moderate AS  High Res Chest CT 06/20/17 1. No findings suggestive of interstitial lung disease on this limited motion degraded scan. 2. Spectrum of findings compatible with congestive heart failure. Moderate cardiomegaly. Small dependent bilateral pleural effusions. Diffuse patchy ground-glass attenuation and interlobular septal thickening compatible with pulmonary edema. 3. Moderate bilateral lower lobe atelectasis. 4. Focal narrowing of the midthoracic trachea lumen by presumed mucoid debris. 5. New mild mediastinal lymphadenopathy, nonspecific, probably reactive. 6. Three-vessel coronary atherosclerosis. 7. Ectatic 4.0 cm ascending thoracic aorta. Recommend annual imaging followup by CTA or MRA.   Objective:   Weight Range: (!) 324 lb 1.2 oz (147 kg) Body mass index is 43.95 kg/m.   Vital Signs:   Temp:  [97.9 F (36.6 C)-99.1 F (37.3 C)] 98 F (36.7 C) (11/08 0709) Pulse Rate:  [72-77] 72 (11/08 0709) Resp:  [17-19] 18 (11/08 0709) BP: (115-156)/(60-89) 115/60 (11/08 1008) SpO2:  [93 %-95 %] 94 % (11/08 0323) Weight:  [324 lb 1.2 oz (147 kg)] 324 lb 1.2 oz (147 kg) (11/08 0323) Last BM  Date: 06/20/17 Weight change: Filed Weights   06/21/17 0334 06/22/17 0416 06/23/17 0323  Weight: (!) 323 lb 3.1 oz (146.6 kg) (!) 322 lb 15.6 oz (146.5 kg) (!) 324 lb 1.2 oz (147 kg)   Intake/Output:   Intake/Output Summary (Last 24 hours) at 06/23/2017 1047 Last data filed at 06/23/2017 0900 Gross per 24 hour  Intake 480 ml  Output 1900 ml  Net -1420 ml    Physical Exam    General: NAD, obese.  HEENT: Normal Neck: Supple. JVP difficult. Carotids 2+ bilat; no bruits. No thyromegaly or nodule noted. Cor: PMI nondisplaced. RRR, No M/G/R noted Lungs: Diminished but clear.  Abdomen: Soft, non-tender, non-distended, no HSM. No bruits or masses. +BS  Extremities: No cyanosis, clubbing, or rash. R and LLE no edema.  Neuro: Alert & orientedx3, cranial nerves grossly intact. moves all 4 extremities w/o difficulty. Affect pleasant   Telemetry   NSR 70-80s, Personally reviewed.   EKG    N/A  Labs    CBC Recent Labs    06/22/17 0220 06/23/17 0236  WBC 15.1* 20.1*  NEUTROABS 13.1* 16.8*  HGB 13.2 13.6  HCT 42.0 42.0  MCV 81.7 80.6  PLT 178 235   Basic Metabolic Panel Recent Labs    06/22/17 0220 06/23/17 0236  NA 134* 135  K 3.7 3.5  CL 95* 95*  CO2 28 29  GLUCOSE 259* 76  BUN 65* 61*  CREATININE 2.00* 2.01*  CALCIUM 9.0 8.9   Liver Function Tests No results for input(s): AST, ALT, ALKPHOS, BILITOT, PROT, ALBUMIN in the last 72 hours. No results for input(s): LIPASE, AMYLASE in  the last 72 hours. Cardiac Enzymes No results for input(s): CKTOTAL, CKMB, CKMBINDEX, TROPONINI in the last 72 hours.  BNP: BNP (last 3 results) Recent Labs    02/09/17 1155 03/23/17 1113 06/13/17 0805  BNP 47.5 78.0 158.5*   ProBNP (last 3 results) No results for input(s): PROBNP in the last 8760 hours.  D-Dimer No results for input(s): DDIMER in the last 72 hours. Hemoglobin A1C No results for input(s): HGBA1C in the last 72 hours. Fasting Lipid Panel No results for  input(s): CHOL, HDL, LDLCALC, TRIG, CHOLHDL, LDLDIRECT in the last 72 hours. Thyroid Function Tests No results for input(s): TSH, T4TOTAL, T3FREE, THYROIDAB in the last 72 hours.  Invalid input(s): FREET3  Other results:  Imaging   No results found.  Medications:    Scheduled Medications: . amLODipine  10 mg Oral Daily  . atorvastatin  40 mg Per Tube Daily  . cholecalciferol  1,000 Units Oral Daily  . famotidine  20 mg Per Tube Daily  . heparin  5,000 Units Subcutaneous Q8H  . hydrALAZINE  50 mg Oral TID  . insulin aspart  0-20 Units Subcutaneous TID WC  . insulin aspart  0-5 Units Subcutaneous QHS  . insulin aspart  15 Units Subcutaneous TID WC  . insulin glargine  65 Units Subcutaneous BID  . mouth rinse  15 mL Mouth Rinse BID  . potassium chloride  40 mEq Oral BID  . protein supplement shake  2 oz Oral BID BM  . torsemide  100 mg Oral BID    Infusions: . sodium chloride 10 mL/hr at 06/16/17 0506    PRN Medications: acetaminophen, hydrALAZINE, ondansetron (ZOFRAN) IV, senna-docusate  Patient Profile   [name] is a 71 year old with history of chronic diastolic heart failure complicated by RV failure, CKD Stage III, symptomatic bradycardia, S/P PPM, ICH 2015, PE 2015, OSA, DM  CAD nonobstructive LHC 2006, morbid obesity, and blindness.   Admitted with acute respiratory failure.  Intubated in the ED.   Assessment/Plan   1. Acute hypoxemic respiratory failure:  Initially on Bipap but later intubated => extubated 11/1.  As stated yesterday, infiltrates on CXR have seemed out of proportion to volume status. ESR 100. Very suspicious for amiodarone toxicity - Amio stopped 11/3. Now on high-dose steroids (methylprednisolone). Seems to be improving slightly. Per notes plan to wean to oral steroids. - High res CT with no evidence of ILD. It did show evidence of CHF, Moderately bilateral lower lobe atelextasis, muocous in the trachea, likely reactive mediastinal  lymphadenopathy, 3V coronary atherosclerosis, and Ectatic 4.0 ascending thoracic aorta.  - PNA less likely, PCT not significantly elevated.   - Remains on 3L O2.  May need home oxygen.  2. Acute on chronic diastolic CHF: Echo (41/32) with EF 60-65%, moderate AS.  He has been diuresed aggressively.  - Volume status OK on exam. CXR 06/21/17 with mild edema. - Creatinine stable.    - Continue torsemide 100 mg BID. He continues to have adequate urine output without metolazone, though weight trending up by bed weights.  - Would resume metolazone 2.5 mg twice weekly on discharge, Monday and Friday.  - Watch renal function closely 3. AKI on CKD Stage III:  - Creatinine stable at 2.01. 4. H/O PE 2015: Venous dopplers negative this admission. No change.  5. ID: ?Viral PNA or pneumonitis.  Doubt bacterial PNA, PCT not significantly elevated.  - Finished levaquin 06/22/17.  - WBCs mildly higher with steroids, but now improved.  6.  H/O Symptomatic Bradycardia: PPM. Stable. .  7. H/O ICH 2015: Not anticoagulated.  No change.  8. LLE Cellulitis: Resolved. Continued abx. No change.    9. HTN:   - BP remained elevated overnight, but 449 systolic after morning meds - Follow on hydralazine 50 mg TID for now.  - Resume toprol XL at 50 mg daily.  10. Atrial fibrillation: Paroxysmal.   - Not anticoagulated given history of intracerebral hemorrhage. NSR on telemetry.   - Stopped amio with ? Toxicity. Will not resume. No change.  11. Aortic stenosis:  - Moderate on Echo 06/14/17. No change.   Stable for home from HF perspective.   Meds for home Torsemide 100 mg BID Metolazone 2.5 mg on Monday and Friday.  Potassium 40 meq q am and 30 meq q pm Toprol XL 50 mg daily (NOTE CHANGE) Amlodipine 10 mg daily Hydralazine 50 mg TID  HF discharge will be made and in chart  Length of Stay: [place], PA-C  06/23/2017, 10:47 AM  Advanced Heart Failure Team Pager [phone] (M-F; 7a - 4p)    Please contact [facility] for night-coverage after hours (4p -7a ) and weekends on [email or web address]  Patient seen with PA, agree with the above note.  I/Os negative again.  Creatinine stable.  I do not think that he is significantly volume overloaded at this point though still requiring 2-3L oxygen.  I think he will likely need home oxygen.    There has been some concern for amiodarone lung toxicity but CT not definitive for this.  We have stopped amiodarone.  He is now off steroids.  Will check with pulmonary to make sure no steroid taper recommended.   Loralie Champagne 06/23/2017 11:18 AM

## 2017-06-23 NOTE — Care Management Note (Signed)
Case Management Note  Patient Details  Name: [name] MRN: [id number] Date of Birth: 06-19-1946  Subjective/Objective:     Pt admitted with SOB, AKI and CHF (on lasix drip)            Action/Plan:  PTA from home with family.  CM spoke with [facility] and informed of admit.   [facility] during the week.  [facility] arranges for [facility] aides in the home and will resume at discharge.  Pt will need PT eval once stable   Expected Discharge Date:                  Expected Discharge Plan:  Home/Self Care  In-House Referral:     Discharge planning Services  CM Consult  Post Acute Care Choice:    Choice offered to:     DME Arranged:    DME Agency:     HH Arranged:    HH Agency:     Status of Service:     If discussed at [employer], dates discussed:    Additional Comments: 06/23/2017 Discussed in LOS 11/8 - pt remains appropriate for continued stay.  Pt remains on IV solumedrol - plan is to wean pt to PO steroids and discharge to SNF - CSW actively working SNF placement Maryclare Labrador, RN 06/23/2017, 9:07 AM

## 2017-06-23 NOTE — Progress Notes (Signed)
  Date: 06/23/2017  Patient name: [name]  Medical record number: [id number]  Date of birth: [birth year]   I have seen and evaluated this patient and I have discussed the plan of care with the house staff. Please see their note for complete details. I concur with their findings with the following additions/corrections: [name] was seen on morning rounds. He was eating breakfast, assisted by a period he was more alert and interactive this morning. He continues to have an oxygen requirement. He is medically stable to be transferred to a SNF once a bed is available. The etiology of his hypoxic respiratory failure is not entirely clear at this time and might be multifactorial including pneumonia, pulmonary edema, and/or ARDS. His white blood cell count increased today after stopping steroids. He is afebrile, no dysuria although he has a condom cath, no diarrhea, no overt respiratory symptoms. I do not think the elevated white blood cell count represents an infection. Rather than an extensive workup for an autoimmune or inflammatory condition, we will trend the white blood cell count. He did have an elevated sedimentation rate and CRP in 2010 that was done when he came in with a skin abnormality eventually thought to be stasis dermatitis.  Transfer to SNF once a bed is available.  [name], MD 06/23/2017, 11:10 AM

## 2017-06-23 NOTE — Progress Notes (Signed)
RT to place cpap on pt. However cpap was on pt already. sats stable and pt is resting comfortably. RT to cont to monitor.

## 2017-06-23 NOTE — Discharge Summary (Signed)
Name: [name] MRN: [id number] DOB: 1946-02-23 71 y.o. PCP: [name], MD  Date of Admission: 06/13/2017  7:58 AM Date of Discharge: 06/24/2017 Attending Physician: [name], MD  Discharge Diagnosis: 1. Acute Hypoxic Respiratory Failure 2. HFpEF, acute exacerbation 3. Acute on Chronic Kidney Disease Stage III 4. Paroxysmal Atrial Fibrillation  5. Diabetes Mellitus   Active Problems:   PAF (paroxysmal atrial fibrillation) (HCC)   Acute on chronic diastolic (congestive) heart failure (HCC)   Acute respiratory failure with hypoxia (HCC)   Cellulitis   Acute encephalopathy   Acute pulmonary edema (HCC)   Amiodarone pulmonary toxicity  Discharge Medications: Allergies as of 06/24/2017   No Known Allergies     Medication List    STOP taking these medications   amiodarone 200 MG tablet Commonly known as:  PACERONE     TAKE these medications   acetaminophen 650 MG CR tablet Commonly known as:  TYLENOL Take 1,300 mg by mouth 2 (two) times daily.   albuterol 108 (90 Base) MCG/ACT inhaler Commonly known as:  PROVENTIL HFA;VENTOLIN HFA Inhale 2 puffs into the lungs every 6 (six) hours as needed for wheezing or shortness of breath.   amLODipine 10 MG tablet Commonly known as:  NORVASC Take 10 mg by mouth daily. Reported on 12/09/2015   aspirin 325 MG tablet Take 325 mg by mouth daily.   atorvastatin 40 MG tablet Commonly known as:  LIPITOR Take 1 tablet (40 mg total) by mouth daily.   BIOFREEZE EX Apply 1 application topically 3 (three) times daily as needed (pain). Reported on 10/27/2015   citalopram 20 MG tablet Commonly known as:  CELEXA Take 20 mg by mouth daily.   eucerin cream Apply 1 application topically 2 (two) times daily.   Febuxostat 80 MG Tabs Take 80 mg by mouth daily.   gabapentin 300 MG capsule Commonly known as:  NEURONTIN Take 300 mg by mouth 2 (two) times daily.   hydrALAZINE 50 MG tablet Commonly known as:   APRESOLINE Take 1 tablet (50 mg total) 3 (three) times daily by mouth. What changed:    medication strength  how much to take   lidocaine 5 % Commonly known as:  LIDODERM Place 1 patch onto the skin daily as needed (back pain). Remove & Discard patch within 12 hours or as directed by MD   meclizine 25 MG tablet Commonly known as:  ANTIVERT Take 1 tablet (25 mg total) by mouth 2 (two) times daily.   Melatonin 3 MG Caps Take 1 capsule by mouth at bedtime as needed (sleep).   methylcellulose 1 % ophthalmic solution Commonly known as:  ARTIFICIAL TEARS Place 1 drop into both eyes 2 (two) times daily as needed (dry eyes).   metolazone 2.5 MG tablet Commonly known as:  ZAROXOLYN Take 2.5 mg by mouth daily.   metoprolol succinate 50 MG 24 hr tablet Commonly known as:  TOPROL-XL Take 1 tablet (50 mg total) daily by mouth. Take with or immediately following a meal. Start taking on:  06/25/2017 What changed:    medication strength  how much to take   ondansetron 4 MG disintegrating tablet Commonly known as:  ZOFRAN ODT Take 1 tablet (4 mg total) by mouth every 8 (eight) hours as needed for nausea or vomiting.   potassium chloride SA 20 MEQ tablet Commonly known as:  K-DUR,KLOR-CON Take 20-30 mEq by mouth See admin instructions. Take 2 tab (16mEq) every morning and 1.5 tab (30 mEq) every evening  ranitidine 150 MG tablet Commonly known as:  ZANTAC Take 150 mg by mouth 2 (two) times daily.   sennosides-docusate sodium 8.6-50 MG tablet Commonly known as:  SENOKOT-S Take 1 tablet by mouth at bedtime as needed and may repeat dose one time if needed for constipation.   torsemide 100 MG tablet Commonly known as:  DEMADEX Take 1 tablet (100 mg total) by mouth 2 (two) times daily.   TOUJEO SOLOSTAR 300 UNIT/ML Sopn Generic drug:  Insulin Glargine Inject 85 Units into the skin 2 (two) times daily.   VICTOZA 18 MG/3ML Sopn Generic drug:  liraglutide Inject 1.8 mg into the  skin daily.   Vitamin D-3 1000 units Caps Take 1 capsule by mouth daily.            Durable Medical Equipment  (From admission, onward)        Start     Ordered   06/24/17 0000  For home use only DME oxygen    Question Answer Comment  Mode or (Route) Nasal cannula   Liters per Minute 4   Frequency Continuous (stationary and portable oxygen unit needed)   Oxygen delivery system Gas      06/24/17 1409     Disposition and follow-up:   [name] was discharged from [facility] in Stable condition.  At the hospital follow up visit please address:  1.  Acute Hypoxic Respiratory Failure. Unfortunately due to the patient's body habitus and weakness we could not assess his need for home oxygen. However, he did desaturate to 86% on RA with rest. Please assess this need once he is able to ambulate. HFpEF, acute exacerbation. Discuss fluid restriction and adherence to his new medication changes. Diabetes Mellitus. Please assess the need for a home health RN to help with insulin administration and CBG checks.   2.  Labs / imaging needed at time of follow-up: None  3.  Pending labs/ test needing follow-up: None  Follow-up Appointments:  Contact information for follow-up providers    Larey Dresser, MD Follow up on 06/30/2017.   Specialty:  Cardiology Why:  at 1200 pm for post hospital follow up. Code for parking is [id number]. Contact information: [place] [address] [phone]            Contact information for after-discharge care    Destination    [facility] .   Service:  Skilled Nursing Contact information: [address] [place] [facility] Course by problem list: Active Problems:   PAF (paroxysmal atrial fibrillation) (HCC)   Acute on chronic diastolic (congestive) heart failure (HCC)   Acute respiratory failure with  hypoxia (HCC)   Cellulitis   Acute encephalopathy   Acute pulmonary edema (HCC)   Amiodarone pulmonary toxicity   1. Acute Hypoxic Respiratory Failure. [name] is a 71 y.o. Male with a PMHx significant for HFpEF, DM, OSA, and A-fib who was brought by EMS to the ED on 10/29 with acute hypoxic respiratory failure which did not improve despite a trial of CPAP and BiPAP. CXR was significant for bilateral pulmonary edema and pleural effusions. The patient was subsequently intubated and taken to the critical care unit for management of his acute hypoxic respiratory failure 2/2 acute heart failure exacerbation and associated cardiorenal syndrome. He was managed with a lasix drip and extubated  on 11/1. Initially the patient was unable to come off of BiPAP without desaturating and CXR illustrated worsening pulmonary edema in the setting of adequate diureses. There was concern for amiodarone toxicity so amiodarone was discontinued and CT chest was performed. The CT did not reveal any signs of ILD but did show pulmonary edema. Aggressive diureses was continued. The patient diuresed >17L and his weight decreased approximately 20 lbs. He was discharged in stable condition. He does like to be on the CPAP machine because he likes the air on his face. Unfortunately we were not able to get a ambulatory pulse ox due to the patient's body habitus and assistance needed to ambulate. We did however check his oxygen saturation at rest on RA which dropped to 86-87% and with 4L/min [place] rebounded to 96%.   2. HFpEF, acute exacerbation. Mr. Crescenzo acute hypoxic respiratory failure was felt to be 2/2 an acute heart failure exacerbation. Cardiology followed the patient throughout his hospitalization and aggressively diuresed the patient. The patient diuresed >17L and his weight decreased approximately 20 lbs. He was discharged on Torsemide 100 mg BID, Metolazone 2.5 mg on Monday and Friday, Potassium 40 meq q am and 30 meq q pm,  Toprol XL 50 mg daily (NOTE CHANGE), Amlodipine 10 mg daily, and Hydralazine 50 mg TID  3. Acute on Chronic Kidney Disease Stage III. Mr. Flahive creatinine remained stable ~2.0 even with aggressive diuresis.   4. Paroxysmal Atrial Fibrillation. Mr. Gwyndolyn Saxon was originally on Amiodarone for rhythm control; however, there was concern for amiodarone toxicity or pneumonitis and it was therefore stopped. Adjustments were made to the patient's Toprol XL. He is not on anticoagulation due to a history of GI bleeding.   5. Diabetes Mellitus. Mr. Hingle was discharged on his original home insulin regimen and given a prescription for a glucometer that can talk with him. He will also need a home health RN to help him check his sugars and administer insulin as he voices that most days he only takes one of his doses of insulin and does not check his sugars.    Discharge Vitals:   BP 124/63 (BP Location: Right Arm)   Pulse 67   Temp (!) 97.4 F (36.3 C) (Axillary)   Resp 18   Ht 6' (1.829 m)   Wt (!) 313 lb 0.9 oz (142 kg)   SpO2 92%   BMI 42.46 kg/m   Pertinent Labs, Studies, and Procedures:   High Resolution Chest CT 1. No findings suggestive of interstitial lung disease on this limited motion degraded scan. 2. Spectrum of findings compatible with congestive heart failure. Moderate cardiomegaly. Small dependent bilateral pleural effusions. Diffuse patchy ground-glass attenuation and interlobular septal thickening compatible with pulmonary edema. 3. Moderate bilateral lower lobe atelectasis. 4. Focal narrowing of the midthoracic trachea lumen by presumed mucoid debris. 5. New mild mediastinal lymphadenopathy, nonspecific, probably reactive. 6. Three-vessel coronary atherosclerosis. 7. Ectatic 4.0 cm ascending thoracic aorta. Recommend annual imaging followup by CTA or MRA.  Discharge Instructions: Discharge Instructions    Diet - low sodium heart healthy   Complete by:  As directed    For  home use only DME oxygen   Complete by:  As directed    Mode or (Route):  Nasal cannula   Liters per Minute:  4   Frequency:  Continuous (stationary and portable oxygen unit needed)   Oxygen delivery system:  Gas   Increase activity slowly   Complete by:  As directed  SignedIna Homes, MD 06/24/2017, 2:09 PM   My Pager: [phone]

## 2017-06-23 NOTE — Progress Notes (Signed)
   Subjective: Doing well this AM. States his breathing is significantly improved but he likes to be on the CPAP because he likes the air blowing on his face. Denies worsening SOB, cough, abdominal pain, diarrhea, or dysuria. We will get a home oxygen evaluation today and tough base with social work about potential discharge. All questions and concerns addressed.   Objective: Vital signs in last 24 hours: Vitals:   06/22/17 1649 06/22/17 1920 06/22/17 2259 06/23/17 0323  BP: (!) 153/89 (!) 156/88 123/61 (!) 151/80  Pulse: 73 77 75 76  Resp: 17 17 19 18   Temp: 98.4 F (36.9 C) 98.2 F (36.8 C) 98.3 F (36.8 C) 99.1 F (37.3 C)  TempSrc: Axillary Axillary Axillary Axillary  SpO2:  95% 93% 94%  Weight:    (!) 324 lb 1.2 oz (147 kg)  Height:       General: Morbidly obese male in no acute distress  Pulm: Good air movement with no wheezing or crackles  CV: RRR, no murmurs, no rubs  Abdomen: Active bowel sounds, soft, no tenderness to palpation  Extremities: No LE edema. Bandage on the left LE in dry and clean   Assessment/Plan:  1. Acute Hypoxic Respiratory Failure  - DDx includes bacterial/viral PNA, ARDS, heart failure/volume overload, pneumonitis(aspiration? Unlikely given bilateral),PE (less likely) - Patient is negative.5 L since admission and weight has decreased over 20 lbs. Continue diuresing per cardiology - Leukocytosis back up to 20 - Continue Levofloxacin 750 mg every 48 hours (2/3 doses) - PT recommending SNF, social work consult placement - Ambulatory pulse oximetry to determine need for home oxygen - Home oxygen evaluation  - Will touch base with social work   2. Acute on Chronic Heart Failure  - Cardiology followingdiuresing withtorsemide 100 mg bid  - Discontinued Amiodaroneon 11/3.  - Creatinine stable  3. AKI on CKD Stage III  - Creatininestable at 2.01today  - Continue to monitor in the setting of aggressive diureses   4. Left Lower Extremity  Cellulitis  - Bandage is dry, continue dressing changes PRN - PE consistent with LE stasis dermatitis  -Adequate coverage with Levofloxacin  5. Paroxysmal Atrial Fibrillation  - Previously rhythm controlled with Amiodarone  - Discontinued Amiodarone  - Not on anticoagulation due to history of GI bleed  6. Diabetes Mellitus  - Decreased Lantus to 65 units BID, continue Novolog 15 units TID WC, and Resistant SSI with meals  7. Hypertension  -Continuing Amlodipine andHydralazine  Dispo: Anticipated discharge pending placement.   Ina Homes, MD 06/23/2017, 5:24 AM My Pager: [phone]

## 2017-06-24 LAB — CBC WITH DIFFERENTIAL/PLATELET
BASOS ABS: 0 10*3/uL (ref 0.0–0.1)
BASOS PCT: 0 %
EOS PCT: 1 %
Eosinophils Absolute: 0.1 10*3/uL (ref 0.0–0.7)
HEMATOCRIT: 43.2 % (ref 39.0–52.0)
Hemoglobin: 14 g/dL (ref 13.0–17.0)
Lymphocytes Relative: 14 %
Lymphs Abs: 2.4 10*3/uL (ref 0.7–4.0)
MCH: 26.6 pg (ref 26.0–34.0)
MCHC: 32.4 g/dL (ref 30.0–36.0)
MCV: 82.1 fL (ref 78.0–100.0)
MONO ABS: 0.5 10*3/uL (ref 0.1–1.0)
MONOS PCT: 3 %
NEUTROS ABS: 14.8 10*3/uL — AB (ref 1.7–7.7)
Neutrophils Relative %: 83 %
PLATELETS: 188 10*3/uL (ref 150–400)
RBC: 5.26 MIL/uL (ref 4.22–5.81)
RDW: 15.8 % — AB (ref 11.5–15.5)
WBC: 17.9 10*3/uL — ABNORMAL HIGH (ref 4.0–10.5)

## 2017-06-24 LAB — GLUCOSE, CAPILLARY
GLUCOSE-CAPILLARY: 151 mg/dL — AB (ref 65–99)
GLUCOSE-CAPILLARY: 116 mg/dL — AB (ref 65–99)
GLUCOSE-CAPILLARY: 119 mg/dL — AB (ref 65–99)
Glucose-Capillary: 88 mg/dL (ref 65–99)

## 2017-06-24 LAB — BASIC METABOLIC PANEL
Anion gap: 11 (ref 5–15)
BUN: 56 mg/dL — AB (ref 6–20)
CALCIUM: 8.8 mg/dL — AB (ref 8.9–10.3)
CO2: 30 mmol/L (ref 22–32)
CREATININE: 2.11 mg/dL — AB (ref 0.61–1.24)
Chloride: 95 mmol/L — ABNORMAL LOW (ref 101–111)
GFR calc Af Amer: 35 mL/min — ABNORMAL LOW (ref >60)
GFR, EST NON AFRICAN AMERICAN: 30 mL/min — AB (ref >60)
GLUCOSE: 65 mg/dL (ref 65–99)
Potassium: 3.3 mmol/L — ABNORMAL LOW (ref 3.5–5.1)
Sodium: 136 mmol/L (ref 135–145)

## 2017-06-24 MED ORDER — METOPROLOL SUCCINATE ER 50 MG PO TB24: 50.0000 mg | ORAL_TABLET | Freq: Every day | ORAL | 0 refills | Status: DC

## 2017-06-24 MED ORDER — HYDRALAZINE HCL 50 MG PO TABS: 50.0000 mg | ORAL_TABLET | Freq: Three times a day (TID) | ORAL | 0 refills | Status: DC

## 2017-06-24 MED ORDER — POTASSIUM CHLORIDE CRYS ER 20 MEQ PO TBCR
40.0000 meq | EXTENDED_RELEASE_TABLET | Freq: Once | ORAL | Status: AC
  Administered 2017-06-24: 40 meq via ORAL
  Filled 2017-06-24: qty 2

## 2017-06-24 NOTE — Progress Notes (Signed)
RT called to pt's room by RN for decreased SPO2. Pt on cpap at this time, but O2 had not been connected and there was a leak coming from Y piece. O2 tubing connected to cpap and flowmeter at 4L. Pt spo2 improved quickly to 94%. RN made aware. RT will continue to monitor.

## 2017-06-24 NOTE — Progress Notes (Signed)
Received a call from [facility] notifying that bed already available for the pt. To arrange transport.

## 2017-06-24 NOTE — Progress Notes (Signed)
Pt. Requested for cpap although o2 sat 96 % on 4l , not in resp. distress. Explained that he does not need it but insisted. RT made aware.

## 2017-06-24 NOTE — Clinical Social Work Note (Addendum)
Patient's daughter sent CSW a message yesterday at 4:35 after CSW left for the day asking why CSW called her and told her that he was discharging to [facility] yesterday. She also stated that "the miscommunication was causing additional stress" but she was "on the way to find resolve." CSW had called her yesterday to inform her of CSW's conversation with patient on Wednesday (which is documented) during which he had said he had been to [facility] before and "it might be alright to go back." CSW has not contacted [facility] regarding potential admission to their facility but only wanted to make her aware of what the patient said on Wednesday.  Dayton Scrape, CSW [phone]  9:34 am CSW met with patient and daughter at bedside to clear up confusion from yesterday. [facility] is definitely not an option they are considering. Patient's daughter is researching [place] [facility] [place] on the [employer]. She is waiting on the MD to round. Patient confirmed that he is on cpap at home so CSW notified them that he would need to bring his own machine.  Dayton Scrape, CSW [phone]  10:32 am Patient's daughter has chosen [facility]. Admissions coordinator notified and would like patient's daughter to contact her to set up a time for paperwork. Admissions coordinator's name and phone number provided to patient's daughter.  Dayton Scrape, CSW [phone]  12:16 pm Patient's daughter currently at [facility]. They cannot take patient if he is continuously on cpap. CSW spoke with RN who stated he is only on it when he sleeps and when requested during the day, although it is not needed. Patient was at 96% when he requested it this morning. They can only check his breathing at a prn level. Per RN yesterday, patient is in a regular bed, not bariatric. Patient's daughter stated the patient will not fit on [facility]'s beds. Admissions coordinator is trying to get a bariatric bed for today. The company will  call her back and let her know.   Dayton Scrape, [place] [phone]  2:01 pm CSW received call from admissions coordinator at [facility]. [facility] is assisting them with getting a bariatric bed today. They will keep admissions coordinator updated on a time the bed will be delivered so transport can be arranged. Admissions coordinator said for MD to go ahead and put in discharge orders and summary so medications can be ordered. MD notified.  Dayton Scrape, [place]

## 2017-06-24 NOTE — Progress Notes (Signed)
Placed on room air, desats to 86%, placed on 4l [place] o2 sat -91% md aware, continue to monitor.

## 2017-06-24 NOTE — Discharge Instructions (Signed)
Thank you for allowing [place] to provide your care. We have made changes to your medication and these changes will be relayed to your primary care doctor.

## 2017-06-24 NOTE — Progress Notes (Signed)
Report given to [facility] staff. Advised to call when the bed is available so transport will be arranged.Provided with tel. # to call.

## 2017-06-24 NOTE — Progress Notes (Signed)
RT called by RN to place patient back on cpap machine. RT placed patient on CPAP with 2L O2 bleed in. RT will continue to monitor.

## 2017-06-24 NOTE — Progress Notes (Signed)
  Date: 06/24/2017  Patient name: [name]  Medical record number: [id number]  Date of birth: [birth year]   I have seen and evaluated this patient and I have discussed the plan of care with the house staff. Please see their note for complete details. I concur with their findings with the following additions/corrections: Mr Vallecillo is stable for D/C home. Was not taking insulin as prescribed nor checking CBG. Asking DM mgmt to see about a talking meter. D/C SNF.  [name], MD 06/24/2017, 2:51 PM

## 2017-06-24 NOTE — Progress Notes (Signed)
Diabetes coordinator was asked for recommendations on voice home blood glucose meters due to patient's needs.  One Drop meter (with bluetooth) Prodigy Auto Code [employer] Premier Voice blood glucose meter, Premier strips, lancet (needs to be ordered online) Cost: $14.98 (this may be the best option)  Harvel Ricks RN BSN CDE Diabetes Coordinator Pager: [phone]  8am-5pm

## 2017-06-24 NOTE — Progress Notes (Signed)
   Subjective: Doing well this AM. He is lying on his stomach breathing comfortably. He is very concerned about his medications and the changes that will be made. Asking that we make sure [facility] has an updated list. He states that the home health aid that comes into his house does not check his sugars and will not know his medications changes. Discussed that we will give a detailed list of his medications on discharge.   We need an ambulatory pulse ox today to determine whether or not he needs home oxygen.   Objective: Vital signs in last 24 hours: Vitals:   06/23/17 1700 06/23/17 1929 06/23/17 2315 06/24/17 0327  BP: 115/70 105/80 (!) 144/77 120/69  Pulse: 72 69 69 67  Resp: (!) 23 (!) 24 18 (!) 23  Temp: 98.1 F (36.7 C) 98.2 F (36.8 C) 98.5 F (36.9 C) 97.9 F (36.6 C)  TempSrc: Axillary Oral Oral Oral  SpO2: 90% 93% 97% 90%  Weight:    (!) 313 lb 0.9 oz (142 kg)  Height:       General: Morbidly obese male in no acute distress Pulm: Good air movement with no wheezing or crackles  CV: RRR, no murmurs, no rubs  Abdomen: Active bowel sounds, soft, no tenderness to palpation  Extremities: Stasis dermatitis bilaterally   Assessment/Plan:  1. Acute Hypoxic Respiratory Failure  - Likely due to heart failure/volume overload - Patient is negative 17L since admission and weight has decreased over 30 lbs. Continue diuresing per cardiology - Leukocytosis trended back down  - Finished course of Levofloxacin 750 mg - Need ambulatory pulse ox prior to discharge to determine whether the patient needs home oxygen  - Will discharge to SNF today   2. Acute on Chronic Heart Failure  - Cardiology followingdiuresing withtorsemide 100 mg bid  - Discontinued Amiodaroneon 11/3.  - Creatinine stable - Discharge on Torsemide 100 mg BID, Metolazone 2.5 mg on Monday and Friday, Potassium 40 meq q am and 30 meq q pm, and Toprol XL 50 mg daily.   3. AKI on CKD Stage III  - Creatininestable at  2.11today  - Continue to monitor in the setting of aggressive diureses   4. Left Lower Extremity Cellulitis  - Bandage is dry, continue dressing changes PRN - PE consistent with LE stasis dermatitis  -Adequate coverage with Levofloxacin  5. Paroxysmal Atrial Fibrillation  - Previously rhythm controlled with Amiodarone  - Not on anticoagulation due to history of GI bleed - Continue to hold Amiodarone on discharge   6. Diabetes Mellitus  - Currently on Lantus to 65 units BID, continue Novolog 15 units TID WC, and Resistant SSI with meals - Home insulin regimen is Glargine 85 units BID with Victoza  7. Hypertension  -Continuing Amlodipine andHydralazine - Discharge on Amlodipine 10 mg daily and Hydralazine 50 mg TID  Dispo: Anticipated discharge today.   Ina Homes, MD 06/24/2017, 4:53 AM My Pager: [phone]

## 2017-06-24 NOTE — Progress Notes (Signed)
Patient discharged to [facility] via EMS with all belongings. All discharge instructions given.

## 2017-06-24 NOTE — Clinical Social Work Note (Signed)
The patient's bariatric bed will be delivered sometime between 5:00 pm and 9:00 pm. Staff at [facility] will call the unit and ask for patient's RN when it arrives so transport can be arranged. CSW sent message to patient's daughter to notify.  For [facility] transport, please call [phone], option 1 then option 3. When asked if he needs any special equipment, tell them he needs oxygen. Transport paperwork in discharge packet on the front of the patient's chart.  RN to call report prior to discharge [phone]).  CSW will sign off for now as social work intervention is no longer needed. Please consult [place] again if new needs arise.  Dayton Scrape, [place]

## 2017-06-29 ENCOUNTER — Telehealth (HOSPITAL_COMMUNITY): Payer: Self-pay

## 2017-06-29 NOTE — Telephone Encounter (Signed)
CHF Clinic appointment reminder call placed to patient for upcoming post-hospital follow up.  Attempted to call patient to confirm tomorrow's apt, no answer, line busy.   Patient also reminded to take all medications as prescribed on the day of his/her appointment and to bring all medications to this appointment.  Advised to call our office for tardiness or cancellations/rescheduling needs.  Leory Plowman, Guinevere Ferrari

## 2017-06-30 ENCOUNTER — Encounter (HOSPITAL_COMMUNITY): Payer: Medicare (Managed Care)

## 2017-07-21 ENCOUNTER — Telehealth (HOSPITAL_COMMUNITY): Payer: Self-pay | Admitting: Cardiology

## 2017-07-21 NOTE — Telephone Encounter (Signed)
Called [facility] and left VM for Sonya to call back to reschedule pt's 07/25/17 appt with Dr. Aundra Dubin d/t inclement weather.   We can reschedule to 07/27/17 in the afternoon per Kevan Rosebush, RN

## 2017-07-22 NOTE — Telephone Encounter (Signed)
[facility] returned my call on 07/21/17 and spoke with Arbutus Leas to reschedule pt's appt.

## 2017-07-25 ENCOUNTER — Encounter (HOSPITAL_COMMUNITY): Payer: Medicare (Managed Care) | Admitting: Cardiology

## 2017-07-26 ENCOUNTER — Other Ambulatory Visit (HOSPITAL_COMMUNITY): Payer: Self-pay | Admitting: Cardiology

## 2017-07-27 ENCOUNTER — Ambulatory Visit (HOSPITAL_COMMUNITY)
Admission: RE | Admit: 2017-07-27 | Discharge: 2017-07-27 | Disposition: A | Discharge status: Home / Self Care | Payer: Medicare (Managed Care) | Source: Ambulatory Visit | Attending: Cardiology | Admitting: Cardiology

## 2017-07-27 VITALS — BP 144/82 | HR 68 | Wt 304.2 lb

## 2017-07-27 DIAGNOSIS — E1169 Type 2 diabetes mellitus with other specified complication: Secondary | ICD-10-CM | POA: Insufficient documentation

## 2017-07-27 DIAGNOSIS — Z950 Presence of cardiac pacemaker: Secondary | ICD-10-CM | POA: Diagnosis not present

## 2017-07-27 DIAGNOSIS — I130 Hypertensive heart and chronic kidney disease with heart failure and stage 1 through stage 4 chronic kidney disease, or unspecified chronic kidney disease: Secondary | ICD-10-CM | POA: Diagnosis not present

## 2017-07-27 DIAGNOSIS — Z89432 Acquired absence of left foot: Secondary | ICD-10-CM | POA: Insufficient documentation

## 2017-07-27 DIAGNOSIS — I480 Paroxysmal atrial fibrillation: Secondary | ICD-10-CM

## 2017-07-27 DIAGNOSIS — E785 Hyperlipidemia, unspecified: Secondary | ICD-10-CM | POA: Diagnosis not present

## 2017-07-27 DIAGNOSIS — I2510 Atherosclerotic heart disease of native coronary artery without angina pectoris: Secondary | ICD-10-CM | POA: Insufficient documentation

## 2017-07-27 DIAGNOSIS — H548 Legal blindness, as defined in USA: Secondary | ICD-10-CM | POA: Insufficient documentation

## 2017-07-27 DIAGNOSIS — Z79899 Other long term (current) drug therapy: Secondary | ICD-10-CM | POA: Diagnosis not present

## 2017-07-27 DIAGNOSIS — I712 Thoracic aortic aneurysm, without rupture: Secondary | ICD-10-CM | POA: Diagnosis not present

## 2017-07-27 DIAGNOSIS — H3530 Unspecified macular degeneration: Secondary | ICD-10-CM | POA: Diagnosis not present

## 2017-07-27 DIAGNOSIS — R001 Bradycardia, unspecified: Secondary | ICD-10-CM | POA: Diagnosis not present

## 2017-07-27 DIAGNOSIS — N183 Chronic kidney disease, stage 3 (moderate): Secondary | ICD-10-CM | POA: Diagnosis not present

## 2017-07-27 DIAGNOSIS — Z7982 Long term (current) use of aspirin: Secondary | ICD-10-CM | POA: Diagnosis not present

## 2017-07-27 DIAGNOSIS — I5032 Chronic diastolic (congestive) heart failure: Secondary | ICD-10-CM | POA: Diagnosis present

## 2017-07-27 DIAGNOSIS — Z794 Long term (current) use of insulin: Secondary | ICD-10-CM | POA: Diagnosis not present

## 2017-07-27 DIAGNOSIS — I495 Sick sinus syndrome: Secondary | ICD-10-CM | POA: Diagnosis not present

## 2017-07-27 DIAGNOSIS — I350 Nonrheumatic aortic (valve) stenosis: Secondary | ICD-10-CM | POA: Diagnosis not present

## 2017-07-27 DIAGNOSIS — E1122 Type 2 diabetes mellitus with diabetic chronic kidney disease: Secondary | ICD-10-CM | POA: Diagnosis not present

## 2017-07-27 DIAGNOSIS — E1142 Type 2 diabetes mellitus with diabetic polyneuropathy: Secondary | ICD-10-CM | POA: Insufficient documentation

## 2017-07-27 DIAGNOSIS — Z6841 Body Mass Index (BMI) 40.0 and over, adult: Secondary | ICD-10-CM | POA: Diagnosis not present

## 2017-07-27 DIAGNOSIS — G4733 Obstructive sleep apnea (adult) (pediatric): Secondary | ICD-10-CM | POA: Insufficient documentation

## 2017-07-27 DIAGNOSIS — Z87891 Personal history of nicotine dependence: Secondary | ICD-10-CM | POA: Diagnosis not present

## 2017-07-27 LAB — BASIC METABOLIC PANEL
Anion gap: 14 (ref 5–15)
BUN: 54 mg/dL — AB (ref 6–20)
CHLORIDE: 93 mmol/L — AB (ref 101–111)
CO2: 31 mmol/L (ref 22–32)
CREATININE: 2.69 mg/dL — AB (ref 0.61–1.24)
Calcium: 9 mg/dL (ref 8.9–10.3)
GFR calc Af Amer: 26 mL/min — ABNORMAL LOW (ref >60)
GFR calc non Af Amer: 23 mL/min — ABNORMAL LOW (ref >60)
Glucose, Bld: 110 mg/dL — ABNORMAL HIGH (ref 65–99)
Potassium: 3.1 mmol/L — ABNORMAL LOW (ref 3.5–5.1)
SODIUM: 138 mmol/L (ref 135–145)

## 2017-07-27 LAB — LIPID PANEL
CHOL/HDL RATIO: 4.3 ratio
CHOLESTEROL: 164 mg/dL (ref 0–200)
HDL: 38 mg/dL — AB (ref >40)
LDL Cholesterol: 90 mg/dL (ref 0–99)
Triglycerides: 179 mg/dL — ABNORMAL HIGH (ref <150)
VLDL: 36 mg/dL (ref 0–40)

## 2017-07-27 NOTE — Progress Notes (Addendum)
Patient ID: [name], male   DOB: May 01, 1946, 71 y.o.   MRN: [id number]   PCP: [name] Cardiology: Dr. Sallyanne Kuster HF Cardiology: Dr. Aundra Dubin Nephrologist: Dr. Merlene Morse [name] is a 71 y.o. 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   He was admitted most recently in 11/18 with acute respiratory failure => intubated.  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  He was treated for acute on chronic diastolic CHF with IV diuresis.  He has remained in NSR.  He returns for followup of CHF.  He is now at [facility] but hopes to go home soon.  He is taking metolaozone 5 days/week (we had recommended 2 days/week going home from hospital).  His breathing is overall better since last hospitalization.  Weight is down around 30 lbs according to our scales.  He is not short of breath at rest or when walking short distances with his cane or walker. He uses a wheelchair to go longer distances.  Last echo in 10/18 showed EF 60-65% with moderate AS.   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 Chronic diastolic CHF with RV dysfunction: Echo (2/15) with EF 60%, mild LVH, mild aortic stenosis with mean gradient 18 mmHg, RV mildly dilated with mildly decreased systolic function.  - Echo (10/18): EF 60-65%, moderate aortic stenosis.  2. Aortic stenosis: Moderate on 10/18 echo.   3. Type II diabetes 4. 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  5. OSA: Uses CPAP.  6. Morbid obesity.  7. HTN 8. CAD: Nonobstructive.  [place] 2005 with 60% LAD stenosis. 9. 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  10. Atrial fibrillation: Paroxysmal.  None recently.  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  11. CKD stage III 12. Macular degeneration: Legally blind.  13. 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  14. 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 15. S/p left foot amputation 16. Diabetic peripheral neuropathy.   Social History   Socioeconomic History  . Marital status: Single    Spouse name: Not on file  . Number of children: Y  . Years of education: Not on file  . Highest education level: Not on file  Social Needs  . Financial resource strain: Not on file  . Food insecurity - worry: Not on file  . Food insecurity - inability: Not on file  . Transportation needs - medical: Not on file  . Transportation needs - non-medical: Not on file  Occupational History  . Occupation: disabled  Tobacco Use  . Smoking status: Former Smoker    Packs/day: 0.00    Years: 0.00    Pack years: 0.00    Types: Cigarettes    Last attempt to quit: 08/17/2003    Years since quitting: 13.9  . Smokeless tobacco: Never Used  . Tobacco comment: started at age 45.  only smoked on occ- very rarely.0  Substance and Sexual Activity  . Alcohol use: Yes    Comment: 06/23/2013 "drank some; never had a problem w/it"  . Drug use: No  . Sexual activity: No  Other Topics Concern  . Not on file  Social History Narrative  . Not on file    Family History  Problem Relation Age of Onset  . Coronary artery disease Sister   . Heart disease Sister   . Allergies Sister   . Asthma Brother   . Rheum arthritis Brother    Review of systems complete and found to be negative unless listed in HPI.    Current Outpatient Medications  Medication Sig Dispense Refill  . acetaminophen (TYLENOL) 650 MG CR tablet Take 1,300 mg by mouth 2 (two) times daily.     Marland Kitchen albuterol (PROVENTIL HFA;VENTOLIN HFA) 108 (90 Base) MCG/ACT inhaler Inhale 2 puffs into the lungs every 6 (six) hours as needed for wheezing or shortness of breath.    Marland Kitchen amLODipine (NORVASC) 10 MG tablet Take 10 mg by mouth daily.  Reported on 12/09/2015    . aspirin 325 MG tablet Take 325 mg by mouth daily.    Marland Kitchen atorvastatin (LIPITOR) 40 MG tablet Take 1 tablet (40 mg total) by mouth daily. 90 tablet 3  . Cholecalciferol (VITAMIN D-3) 1000 UNITS CAPS Take 1 capsule by mouth daily.    . citalopram (CELEXA) 20 MG tablet Take 20 mg by mouth daily.    . Febuxostat 80 MG TABS Take 80 mg by mouth daily.    Marland Kitchen gabapentin (NEURONTIN) 300 MG capsule Take 300 mg by mouth 2 (two) times daily.    . hydrALAZINE (APRESOLINE) 50 MG tablet Take 1 tablet (50 mg total) 3 (three) times daily by mouth. 90 tablet 0  . Insulin Glargine (TOUJEO SOLOSTAR) 300 UNIT/ML SOPN Inject 85 Units into the skin 2 (two) times daily.     Marland Kitchen lidocaine (LIDODERM) 5 % Place 1 patch onto the skin daily as needed (back pain). Remove & Discard patch within 12 hours or as directed by MD    . Liraglutide (VICTOZA) 18 MG/3ML SOPN Inject 1.8 mg into the skin daily.    . meclizine (ANTIVERT) 25 MG tablet Take 1 tablet (25 mg total) by mouth 2 (two) times daily. 20 tablet 0  . Melatonin 3 MG CAPS Take 1 capsule by mouth at bedtime as needed (sleep).     . Menthol, Topical Analgesic, (BIOFREEZE EX) Apply 1 application topically 3 (three) times daily as needed (pain). Reported on 10/27/2015    . methylcellulose (ARTIFICIAL TEARS) 1 % ophthalmic solution Place 1 drop into both eyes 2 (two) times daily as needed (dry eyes).     . metolazone (ZAROXOLYN) 2.5 MG tablet Take 2.5 mg by mouth daily.     . metoprolol succinate (TOPROL-XL) 50 MG 24 hr tablet Take 1 tablet (50 mg total) daily by mouth. Take with or immediately following a meal. 30 tablet 0  . ondansetron (ZOFRAN ODT) 4 MG disintegrating tablet Take 1 tablet (4 mg total) by mouth every 8 (eight) hours as needed for nausea or vomiting. 12 tablet 1  . potassium chloride SA (K-DUR,KLOR-CON) 20 MEQ tablet Take 20-30 mEq by mouth See admin instructions. Take 2 tab (28mq) every morning and 1.5 tab (30 mEq) every evening    .  ranitidine (ZANTAC) 150 MG tablet Take 150 mg by mouth 2 (two) times daily.    . sennosides-docusate sodium (SENOKOT-S) 8.6-50 MG tablet Take 1 tablet by mouth at bedtime as needed and may repeat dose one time if needed for constipation.     . Skin Protectants, Misc. (EUCERIN) cream Apply 1 application topically 2 (two) times daily.    .Marland Kitchentorsemide (DEMADEX) 100 MG tablet Take 1 tablet (100 mg total) by mouth 2 (two) times daily. 6[place]  tablet 6   No current facility-administered medications for this encounter.    Facility-Administered Medications Ordered in Other Encounters  Medication Dose Route Frequency Provider Last Rate Last Dose  . chlorhexidine gluconate (MEDLINE KIT) (PERIDEX) 0.12 % solution 15 mL  15 mL Mouth Rinse BID Chesley Mires, MD      . MEDLINE mouth rinse  15 mL Mouth Rinse QID Halford Chessman, Vineet, MD       BP (!) 144/82 (BP Location: Left Wrist, Patient Position: Sitting, Cuff Size: Normal)   Pulse 68   Wt (!) 304 lb 3.2 oz (138 kg)   SpO2 91%   BMI 41.26 kg/m    Wt Readings from Last 3 Encounters:  07/27/17 (!) 304 lb 3.2 oz (138 kg)  06/24/17 (!) 313 lb 0.9 oz (142 kg)  06/07/17 (!) 340 lb (154.2 kg)    General: NAD Neck: Thick, no JVD, no thyromegaly or thyroid nodule.  Lungs: Clear to auscultation bilaterally with normal respiratory effort. CV: Nondisplaced PMI.  Heart regular S1/S2, no S3/S4, 3/6 crescendo-descrescendo murmur RUSB with clear S2.  Chronic 1+ lower leg edema.  No carotid bruit.  Unable to palpate pedal pulses.  Abdomen: Soft, nontender, no hepatosplenomegaly, no distention.  Skin: Intact without lesions or rashes.  Neurologic: Alert and oriented x 3.  Psych: Normal affect. Extremities: No clubbing or cyanosis.  HEENT: Normal.   Assessment/Plan: 1. Chronic diastolic CHF with prominent RV failure: Echo in 10/18 with EF 60-65%.  NYHA class III symptoms.  Weight is down and he does not look particularly volume overloaded on exam.  - Continue torsemide 100  mg bid and current metolazone (5 days/week). However, will check BMET today, and if creatinine is higher, will cut back on metolazone (had wanted him to go home from the hospital on twice weekly metolazone).   2. Aortic stenosis: Echo in 10/18 with moderate AS.   3. Atrial fibrillation: Paroxysmal.  He is in NSR today.  He is not anticoagulated (takes ASA) due to prior intracerebral hemorrhage.  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  4. CAD: Nonobstructive.  Denies chest pain.  - Continue ASA and statin.  I will check lipids today.  5. Bradycardia s/p Medtronic PPM  6. CKD: Stage III. BMET today.   7. OSA: Continue CPAP qhs. 8. Ascending aorta aneurysm: 4.0 cm on 11/18 CT chest. Repeat imaging 1 year.   Followup in 6 wks.   Loralie Champagne, MD  07/27/2017

## 2017-07-27 NOTE — Patient Instructions (Addendum)
Labs drawn today (if we do not call you, then your lab work was stable)   Your physician recommends that you schedule a follow-up appointment in: 6 weeks in [facility]

## 2017-07-27 NOTE — Addendum Note (Signed)
Encounter addended by: Larey Dresser, MD on: 07/27/2017 10:25 PM  Actions taken: Sign clinical note

## 2017-08-08 ENCOUNTER — Telehealth (HOSPITAL_COMMUNITY): Payer: Self-pay | Admitting: *Deleted

## 2017-08-08 MED ORDER — METOLAZONE 2.5 MG PO TABS: 2.5000 mg | ORAL_TABLET | ORAL | 3 refills | Status: DC

## 2017-08-08 MED ORDER — POTASSIUM CHLORIDE CRYS ER 20 MEQ PO TBCR: EXTENDED_RELEASE_TABLET | ORAL | 3 refills | Status: DC

## 2017-08-08 NOTE — Telephone Encounter (Signed)
Result Notes for Basic metabolic panel   Notes recorded by Darron Doom, RN on 08/08/2017 at 10:14 AM EST [facility] called back for medication changes. Med changes and lab draw orders faxed to them @ [phone]. Medications updated in Epic. ------  Notes recorded by Shirley Muscat, RN on 08/05/2017 at 3:11 PM EST Left message with [facility] regarding med changes and BMET asked them to return message ------  Notes recorded by Shirley Muscat, RN on 08/04/2017 at 4:14 PM EST Got in touch with Pt, will contact [facility] about med changes ------  Notes recorded by Shirley Muscat, RN on 08/04/2017 at 3:48 PM EST Unable to contact pt, called emergency contact ------  Notes recorded by Harvie Junior, CMA on 08/02/2017 at 11:08 AM EST No answer no VM will try patient again tomorrow. ------  Notes recorded by Harvie Junior, CMA on 07/29/2017 at 2:55 PM EST No answer no VM. Will try patient again Monday ------  Notes recorded by Larey Dresser, MD on 07/27/2017 at 4:40 PM EST Decrease metolazone to three days/week, M/W/F. Also have him increase KCl to 60 qam/40 qpm. Needs BMET in 1 week.

## 2017-08-15 ENCOUNTER — Inpatient Hospital Stay (HOSPITAL_COMMUNITY)
Admission: EM | Admit: 2017-08-15 | Discharge: 2017-08-17 | DRG: 640 | Disposition: A | Discharge status: Home Health | Payer: Medicare (Managed Care) | Attending: Family Medicine | Admitting: Family Medicine

## 2017-08-15 ENCOUNTER — Emergency Department (HOSPITAL_COMMUNITY): Payer: Medicare (Managed Care)

## 2017-08-15 ENCOUNTER — Other Ambulatory Visit: Payer: Self-pay

## 2017-08-15 ENCOUNTER — Encounter (HOSPITAL_COMMUNITY): Payer: Self-pay

## 2017-08-15 DIAGNOSIS — G9341 Metabolic encephalopathy: Secondary | ICD-10-CM | POA: Diagnosis present

## 2017-08-15 DIAGNOSIS — E1122 Type 2 diabetes mellitus with diabetic chronic kidney disease: Secondary | ICD-10-CM | POA: Diagnosis present

## 2017-08-15 DIAGNOSIS — M1990 Unspecified osteoarthritis, unspecified site: Secondary | ICD-10-CM | POA: Diagnosis present

## 2017-08-15 DIAGNOSIS — I2510 Atherosclerotic heart disease of native coronary artery without angina pectoris: Secondary | ICD-10-CM | POA: Diagnosis present

## 2017-08-15 DIAGNOSIS — Z8619 Personal history of other infectious and parasitic diseases: Secondary | ICD-10-CM

## 2017-08-15 DIAGNOSIS — H548 Legal blindness, as defined in USA: Secondary | ICD-10-CM | POA: Diagnosis present

## 2017-08-15 DIAGNOSIS — R531 Weakness: Secondary | ICD-10-CM | POA: Diagnosis present

## 2017-08-15 DIAGNOSIS — M549 Dorsalgia, unspecified: Secondary | ICD-10-CM | POA: Diagnosis present

## 2017-08-15 DIAGNOSIS — I480 Paroxysmal atrial fibrillation: Secondary | ICD-10-CM | POA: Diagnosis present

## 2017-08-15 DIAGNOSIS — R278 Other lack of coordination: Secondary | ICD-10-CM | POA: Diagnosis present

## 2017-08-15 DIAGNOSIS — N3000 Acute cystitis without hematuria: Secondary | ICD-10-CM | POA: Diagnosis present

## 2017-08-15 DIAGNOSIS — E785 Hyperlipidemia, unspecified: Secondary | ICD-10-CM | POA: Diagnosis present

## 2017-08-15 DIAGNOSIS — I43 Cardiomyopathy in diseases classified elsewhere: Secondary | ICD-10-CM | POA: Diagnosis present

## 2017-08-15 DIAGNOSIS — I5022 Chronic systolic (congestive) heart failure: Secondary | ICD-10-CM | POA: Diagnosis present

## 2017-08-15 DIAGNOSIS — T502X5A Adverse effect of carbonic-anhydrase inhibitors, benzothiadiazides and other diuretics, initial encounter: Secondary | ICD-10-CM | POA: Diagnosis present

## 2017-08-15 DIAGNOSIS — G4733 Obstructive sleep apnea (adult) (pediatric): Secondary | ICD-10-CM | POA: Diagnosis present

## 2017-08-15 DIAGNOSIS — I130 Hypertensive heart and chronic kidney disease with heart failure and stage 1 through stage 4 chronic kidney disease, or unspecified chronic kidney disease: Secondary | ICD-10-CM | POA: Diagnosis present

## 2017-08-15 DIAGNOSIS — N183 Chronic kidney disease, stage 3 (moderate): Secondary | ICD-10-CM | POA: Diagnosis present

## 2017-08-15 DIAGNOSIS — N1832 Chronic kidney disease, stage 3b: Secondary | ICD-10-CM | POA: Diagnosis present

## 2017-08-15 DIAGNOSIS — E1142 Type 2 diabetes mellitus with diabetic polyneuropathy: Secondary | ICD-10-CM | POA: Diagnosis present

## 2017-08-15 DIAGNOSIS — N400 Enlarged prostate without lower urinary tract symptoms: Secondary | ICD-10-CM | POA: Diagnosis present

## 2017-08-15 DIAGNOSIS — Z931 Gastrostomy status: Secondary | ICD-10-CM

## 2017-08-15 DIAGNOSIS — I5042 Chronic combined systolic (congestive) and diastolic (congestive) heart failure: Secondary | ICD-10-CM | POA: Diagnosis present

## 2017-08-15 DIAGNOSIS — G252 Other specified forms of tremor: Secondary | ICD-10-CM | POA: Diagnosis present

## 2017-08-15 DIAGNOSIS — E7800 Pure hypercholesterolemia, unspecified: Secondary | ICD-10-CM | POA: Diagnosis present

## 2017-08-15 DIAGNOSIS — E870 Hyperosmolality and hypernatremia: Secondary | ICD-10-CM | POA: Diagnosis not present

## 2017-08-15 DIAGNOSIS — B964 Proteus (mirabilis) (morganii) as the cause of diseases classified elsewhere: Secondary | ICD-10-CM | POA: Diagnosis present

## 2017-08-15 DIAGNOSIS — K219 Gastro-esophageal reflux disease without esophagitis: Secondary | ICD-10-CM | POA: Diagnosis present

## 2017-08-15 DIAGNOSIS — E860 Dehydration: Secondary | ICD-10-CM | POA: Diagnosis present

## 2017-08-15 DIAGNOSIS — Z6841 Body Mass Index (BMI) 40.0 and over, adult: Secondary | ICD-10-CM | POA: Diagnosis not present

## 2017-08-15 DIAGNOSIS — Z8673 Personal history of transient ischemic attack (TIA), and cerebral infarction without residual deficits: Secondary | ICD-10-CM

## 2017-08-15 DIAGNOSIS — Z794 Long term (current) use of insulin: Secondary | ICD-10-CM

## 2017-08-15 DIAGNOSIS — Z7982 Long term (current) use of aspirin: Secondary | ICD-10-CM

## 2017-08-15 DIAGNOSIS — N179 Acute kidney failure, unspecified: Secondary | ICD-10-CM | POA: Diagnosis present

## 2017-08-15 DIAGNOSIS — M109 Gout, unspecified: Secondary | ICD-10-CM | POA: Diagnosis present

## 2017-08-15 DIAGNOSIS — Z950 Presence of cardiac pacemaker: Secondary | ICD-10-CM

## 2017-08-15 DIAGNOSIS — R5381 Other malaise: Secondary | ICD-10-CM | POA: Diagnosis present

## 2017-08-15 DIAGNOSIS — Z89432 Acquired absence of left foot: Secondary | ICD-10-CM

## 2017-08-15 DIAGNOSIS — Z9989 Dependence on other enabling machines and devices: Secondary | ICD-10-CM

## 2017-08-15 DIAGNOSIS — N390 Urinary tract infection, site not specified: Secondary | ICD-10-CM | POA: Diagnosis present

## 2017-08-15 DIAGNOSIS — R638 Other symptoms and signs concerning food and fluid intake: Secondary | ICD-10-CM | POA: Diagnosis present

## 2017-08-15 DIAGNOSIS — Z87891 Personal history of nicotine dependence: Secondary | ICD-10-CM

## 2017-08-15 DIAGNOSIS — I447 Left bundle-branch block, unspecified: Secondary | ICD-10-CM | POA: Diagnosis present

## 2017-08-15 DIAGNOSIS — M1711 Unilateral primary osteoarthritis, right knee: Secondary | ICD-10-CM | POA: Diagnosis present

## 2017-08-15 DIAGNOSIS — I10 Essential (primary) hypertension: Secondary | ICD-10-CM | POA: Diagnosis present

## 2017-08-15 DIAGNOSIS — Z79899 Other long term (current) drug therapy: Secondary | ICD-10-CM

## 2017-08-15 LAB — BASIC METABOLIC PANEL
Anion gap: 11 (ref 5–15)
BUN: 66 mg/dL — AB (ref 6–20)
CHLORIDE: 107 mmol/L (ref 101–111)
CO2: 33 mmol/L — AB (ref 22–32)
Calcium: 10 mg/dL (ref 8.9–10.3)
Creatinine, Ser: 2.75 mg/dL — ABNORMAL HIGH (ref 0.61–1.24)
GFR calc non Af Amer: 22 mL/min — ABNORMAL LOW (ref >60)
GFR, EST AFRICAN AMERICAN: 25 mL/min — AB (ref >60)
Glucose, Bld: 121 mg/dL — ABNORMAL HIGH (ref 65–99)
POTASSIUM: 3.8 mmol/L (ref 3.5–5.1)
SODIUM: 151 mmol/L — AB (ref 135–145)

## 2017-08-15 LAB — URINALYSIS, ROUTINE W REFLEX MICROSCOPIC
BILIRUBIN URINE: NEGATIVE
GLUCOSE, UA: NEGATIVE mg/dL
HGB URINE DIPSTICK: NEGATIVE
KETONES UR: NEGATIVE mg/dL
Nitrite: NEGATIVE
PROTEIN: NEGATIVE mg/dL
Specific Gravity, Urine: 1.01 (ref 1.005–1.030)
pH: 7 (ref 5.0–8.0)

## 2017-08-15 LAB — CBC
HEMATOCRIT: 52.2 % — AB (ref 39.0–52.0)
Hemoglobin: 16.1 g/dL (ref 13.0–17.0)
MCH: 26.8 pg (ref 26.0–34.0)
MCHC: 30.8 g/dL (ref 30.0–36.0)
MCV: 87 fL (ref 78.0–100.0)
Platelets: 202 10*3/uL (ref 150–400)
RBC: 6 MIL/uL — AB (ref 4.22–5.81)
RDW: 16.8 % — ABNORMAL HIGH (ref 11.5–15.5)
WBC: 12.5 10*3/uL — AB (ref 4.0–10.5)

## 2017-08-15 LAB — BLOOD GAS, VENOUS
ACID-BASE EXCESS: 8.6 mmol/L — AB (ref 0.0–2.0)
BICARBONATE: 34.2 mmol/L — AB (ref 20.0–28.0)
Drawn by: 257881
O2 SAT: 71.2 %
PATIENT TEMPERATURE: 98.6
pCO2, Ven: 50.2 mmHg (ref 44.0–60.0)
pH, Ven: 7.448 — ABNORMAL HIGH (ref 7.250–7.430)
pO2, Ven: 40.2 mmHg (ref 32.0–45.0)

## 2017-08-15 LAB — AMMONIA: Ammonia: 20 umol/L (ref 9–35)

## 2017-08-15 LAB — BRAIN NATRIURETIC PEPTIDE: B Natriuretic Peptide: 57.2 pg/mL (ref 0.0–100.0)

## 2017-08-15 LAB — PHOSPHORUS: Phosphorus: 3.1 mg/dL (ref 2.5–4.6)

## 2017-08-15 LAB — MAGNESIUM: Magnesium: 2.1 mg/dL (ref 1.7–2.4)

## 2017-08-15 LAB — GLUCOSE, CAPILLARY
GLUCOSE-CAPILLARY: 88 mg/dL (ref 65–99)
GLUCOSE-CAPILLARY: 136 mg/dL — AB (ref 65–99)

## 2017-08-15 MED ORDER — METOPROLOL SUCCINATE ER 50 MG PO TB24
50.0000 mg | ORAL_TABLET | Freq: Every day | ORAL | Status: DC
  Administered 2017-08-16 – 2017-08-17 (×2): 50 mg via ORAL
  Filled 2017-08-15 (×2): qty 1

## 2017-08-15 MED ORDER — ALBUTEROL SULFATE (2.5 MG/3ML) 0.083% IN NEBU: 2.5000 mg | INHALATION_SOLUTION | Freq: Four times a day (QID) | RESPIRATORY_TRACT | Status: DC | PRN

## 2017-08-15 MED ORDER — FEBUXOSTAT 40 MG PO TABS
80.0000 mg | ORAL_TABLET | Freq: Every day | ORAL | Status: DC
  Administered 2017-08-16 – 2017-08-17 (×2): 80 mg via ORAL
  Filled 2017-08-15 (×2): qty 2

## 2017-08-15 MED ORDER — HEPARIN SODIUM (PORCINE) 5000 UNIT/ML IJ SOLN
5000.0000 [IU] | Freq: Three times a day (TID) | INTRAMUSCULAR | Status: DC
  Administered 2017-08-15 – 2017-08-17 (×6): 5000 [IU] via SUBCUTANEOUS
  Filled 2017-08-15 (×6): qty 1

## 2017-08-15 MED ORDER — SODIUM CHLORIDE 0.45 % IV SOLN
INTRAVENOUS | Status: DC
  Administered 2017-08-15 – 2017-08-17 (×3): via INTRAVENOUS

## 2017-08-15 MED ORDER — CITALOPRAM HYDROBROMIDE 10 MG PO TABS
20.0000 mg | ORAL_TABLET | Freq: Every day | ORAL | Status: DC
  Administered 2017-08-16 – 2017-08-17 (×2): 20 mg via ORAL
  Filled 2017-08-15 (×2): qty 2

## 2017-08-15 MED ORDER — POLYETHYLENE GLYCOL 3350 17 G PO PACK: 17.0000 g | PACK | Freq: Every day | ORAL | Status: DC | PRN

## 2017-08-15 MED ORDER — HYDRALAZINE HCL 50 MG PO TABS
50.0000 mg | ORAL_TABLET | Freq: Three times a day (TID) | ORAL | Status: DC
  Administered 2017-08-15 – 2017-08-17 (×6): 50 mg via ORAL
  Filled 2017-08-15 (×6): qty 1

## 2017-08-15 MED ORDER — GABAPENTIN 300 MG PO CAPS
300.0000 mg | ORAL_CAPSULE | Freq: Two times a day (BID) | ORAL | Status: DC
  Administered 2017-08-15 – 2017-08-17 (×4): 300 mg via ORAL
  Filled 2017-08-15 (×4): qty 1

## 2017-08-15 MED ORDER — ATORVASTATIN CALCIUM 40 MG PO TABS
40.0000 mg | ORAL_TABLET | Freq: Every day | ORAL | Status: DC
  Administered 2017-08-15 – 2017-08-17 (×3): 40 mg via ORAL
  Filled 2017-08-15 (×3): qty 1

## 2017-08-15 MED ORDER — POTASSIUM CHLORIDE CRYS ER 20 MEQ PO TBCR
20.0000 meq | EXTENDED_RELEASE_TABLET | Freq: Every day | ORAL | Status: DC
  Administered 2017-08-16 – 2017-08-17 (×2): 20 meq via ORAL
  Filled 2017-08-15 (×2): qty 1

## 2017-08-15 MED ORDER — HYDROCERIN EX CREA
TOPICAL_CREAM | Freq: Two times a day (BID) | CUTANEOUS | Status: DC
  Administered 2017-08-16 – 2017-08-17 (×3): via TOPICAL
  Filled 2017-08-15 (×2): qty 113

## 2017-08-15 MED ORDER — AMLODIPINE BESYLATE 5 MG PO TABS
10.0000 mg | ORAL_TABLET | Freq: Every day | ORAL | Status: DC
  Administered 2017-08-16 – 2017-08-17 (×2): 10 mg via ORAL
  Filled 2017-08-15 (×2): qty 2

## 2017-08-15 MED ORDER — SENNOSIDES-DOCUSATE SODIUM 8.6-50 MG PO TABS
1.0000 | ORAL_TABLET | Freq: Two times a day (BID) | ORAL | Status: DC
  Administered 2017-08-15 – 2017-08-17 (×4): 1 via ORAL
  Filled 2017-08-15 (×4): qty 1

## 2017-08-15 MED ORDER — LIDOCAINE 5 % EX PTCH
1.0000 | MEDICATED_PATCH | Freq: Every day | CUTANEOUS | Status: DC | PRN
  Filled 2017-08-15: qty 1

## 2017-08-15 MED ORDER — DEXTROSE 5 % IV SOLN
1.0000 g | INTRAVENOUS | Status: DC
  Administered 2017-08-16 – 2017-08-17 (×2): 1 g via INTRAVENOUS
  Filled 2017-08-15 (×2): qty 10

## 2017-08-15 MED ORDER — FAMOTIDINE 20 MG PO TABS
20.0000 mg | ORAL_TABLET | Freq: Every day | ORAL | Status: DC
  Administered 2017-08-16 – 2017-08-17 (×2): 20 mg via ORAL
  Filled 2017-08-15 (×2): qty 1

## 2017-08-15 MED ORDER — EUCERIN EX CREA
1.0000 | TOPICAL_CREAM | Freq: Two times a day (BID) | CUTANEOUS | Status: DC
Start: 2017-08-15 — End: 2017-08-15

## 2017-08-15 MED ORDER — INSULIN ASPART 100 UNIT/ML ~~LOC~~ SOLN
0.0000 [IU] | Freq: Three times a day (TID) | SUBCUTANEOUS | Status: DC
  Administered 2017-08-17: 7 [IU] via SUBCUTANEOUS

## 2017-08-15 MED ORDER — ALBUTEROL SULFATE HFA 108 (90 BASE) MCG/ACT IN AERS: 2.0000 | INHALATION_SPRAY | Freq: Four times a day (QID) | RESPIRATORY_TRACT | Status: DC | PRN

## 2017-08-15 MED ORDER — ASPIRIN 325 MG PO TABS
325.0000 mg | ORAL_TABLET | Freq: Every day | ORAL | Status: DC
  Administered 2017-08-16 – 2017-08-17 (×2): 325 mg via ORAL
  Filled 2017-08-15 (×2): qty 1

## 2017-08-15 MED ORDER — VITAMIN D3 25 MCG (1000 UNIT) PO TABS
1000.0000 [IU] | ORAL_TABLET | Freq: Every day | ORAL | Status: DC
  Administered 2017-08-16 – 2017-08-17 (×2): 1000 [IU] via ORAL
  Filled 2017-08-15 (×2): qty 1

## 2017-08-15 MED ORDER — SENNA-DOCUSATE SODIUM 8.6-50 MG PO TABS: 1.0000 | ORAL_TABLET | Freq: Two times a day (BID) | ORAL | Status: DC

## 2017-08-15 MED ORDER — INSULIN GLARGINE 100 UNIT/ML ~~LOC~~ SOLN
40.0000 [IU] | Freq: Two times a day (BID) | SUBCUTANEOUS | Status: DC
Start: 2017-08-15 — End: 2017-08-16
  Administered 2017-08-15: 40 [IU] via SUBCUTANEOUS
  Filled 2017-08-15 (×2): qty 0.4

## 2017-08-15 MED ORDER — DEXTROSE 5 % IV SOLN
1.0000 g | Freq: Once | INTRAVENOUS | Status: AC
  Administered 2017-08-15: 1 g via INTRAVENOUS
  Filled 2017-08-15: qty 10

## 2017-08-15 NOTE — ED Provider Notes (Signed)
[facility] DEPT Provider Note   CSN: [id number] Arrival date & time: 08/15/17  1438     History   Chief Complaint Chief Complaint  Patient presents with  . Tremors  . Headache    HPI [name] is a 71 y.o. male.  HPI Reports a 3-4 days now he has been getting a tremor.  He reports his arms shake particularly if he tries to keep them in a steady position.  He reports he is still able to walk with his cane as per baseline.  He states he is also been having posterior headache.  He thought that it was may be the position he was sleeping that was giving him neck pain and thus a headache so he is tried repositioning his head in multiple different angles for the past several nights and not getting any improvement in the aching quality of pain in the back of his head.  No nausea no vomiting.  He is blind at baseline.  Besides the tremor, he denies he said other focal weakness or numbness different from his baseline with prior stroke.  Patient reports he has been compliant with his medications. Past Medical History:  Diagnosis Date  . Arthritis    "all over" (06/23/2013)  . Atrial fibrillation ([place])    not Candidate for Coumadin d/t rectal bleeding  . Benign prostatic hypertrophy   . Cardiomyopathy, hypertensive ([place])     diastolic dysfunction by 9485 echo  . Coronary artery disease     nonobstructive. 2 vessel. cardiac catheter in March 2005 showing 60% LAD occlusion, 30% circumflex occlusion.  . Degenerative joint disease     Right knee.  . Diabetes type 2, uncontrolled ([place])   . Diabetic peripheral neuropathy ([place])   . Diabetic ulcer of thigh ([place])    H/O Rt thigh ulcer.  . Exertional shortness of breath    "comes and goes" (06/23/2013)  . GERD (gastroesophageal reflux disease)   . Gout   . Herpes   . Hyperlipidemia   . Hypertension   . Hypotension 09/05/2013  . Incarcerated ventral hernia    H/O. S/P repair ventral hernia repair 07/2005.   Marland Kitchen LBBB (left bundle branch block)   . Legally blind   . Morbid obesity ([place])   . Obstructive sleep apnea    On CPAP. Last sleep study (06/2009) - Severe obstructive sleep apnea/hypopnea syndrome, apnea-hypopnea index 70.5 per hour with non positional events moderately loud snoring and oxygen desaturation to a nadir of 85% on room air.  . Occult blood positive stool     History of in August 2007.  colonoscopy in January 2008 showing normal colon with fair inadequate prep. By Dr. Silvano Rusk.  . 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 Sepsis ([place]) 08/26/2013    Patient Active Problem List   Diagnosis Date Noted  . Chronic systolic CHF (congestive heart failure) ([place]) 08/15/2017  . Hypernatremia 08/15/2017  . AKI (acute kidney injury) ([place]) 08/15/2017  . Acute lower UTI 08/15/2017  . Coarse tremors 08/15/2017  . Amiodarone pulmonary toxicity   . Cellulitis   . Acute encephalopathy   . Acute pulmonary edema (HCC)   . Acute respiratory failure with hypoxia ([place]) 06/13/2017  . SSS (sick sinus syndrome) ([place]) 05/19/2016  . Cough 08/20/2015  . Acute on chronic diastolic (congestive) heart failure ([place]) 07/28/2015  . Morbid obesity ([place]) 07/09/2015  . [place] thrombus of heart 10/08/2013  . Cardiac mass 10/04/2013  . CVA (  cerebrovascular accident due to intracerebral hemorrhage) ([place]) 10/02/2013  . CVA (cerebral infarction) 10/01/2013  . Intracerebral hemorrhage ([place]) 10/01/2013  . Hypertension 10/01/2013  . ICH (intracerebral hemorrhage) ([place]) 10/01/2013  . Right knee pain 09/07/2013  . CKD (chronic kidney disease) stage 3, GFR 30-59 ml/min (HCC) 09/07/2013  . Orchitis 09/05/2013  . Unspecified protein-calorie malnutrition ([place]) 09/05/2013  . Sepsis ([place]) 08/27/2013  . Trichomonas infection 08/27/2013  . Epididymo-orchitis, acute 08/27/2013  . PAF (paroxysmal atrial fibrillation) ([place]) 06/24/2013  . OSA (obstructive sleep apnea) 05/04/2011  . Shortness of breath 03/17/2011    . Depression 10/23/2010  . Degenerative joint disease   . Cardiomyopathy, hypertensive ([place])   . Coronary artery disease involving native coronary artery of native heart without angina pectoris   . Pacemaker   . Incarcerated ventral hernia   . COPD (chronic obstructive pulmonary disease) ([place]) 11/28/2009  . Type 2 diabetes mellitus, uncontrolled ([place]) 07/20/2006  . Hyperlipidemia 07/20/2006  . GOUT 07/20/2006  . MACULAR DEGENERATION 07/20/2006  . Essential hypertension 07/20/2006  . BENIGN PROSTATIC HYPERTROPHY, HX OF 07/20/2006    Past Surgical History:  Procedure Laterality Date  . AMPUTATION Left 06/22/2013   Procedure: AMPUTATION FOOT;  Surgeon: Newt Minion, MD;  Location: [facility];  Service: Orthopedics;  Laterality: Left;  Left Midfoot Amputation  . CARDIAC CATHETERIZATION  10/22/2003   Dilatation of the LV with preserved LV systolic function. Tortous aorta, but no AAA. Moderate disease with 50-60% area of narrowing in the circumflex after the second OM and 70% mid narrowing in the LAD. No intervention.  Marland Kitchen CARDIOVASCULAR STRESS TEST  01/23/2010   Perfusion defect seen in inferior myocardial region is consistent with diaphragmatic attenuation. Remaining myocardium demonstrates normal myocardial perfusion with no evidence of ischemia or infarct. Stress ECG was non-diagnostic due to LBBB.  Marland Kitchen CATARACT EXTRACTION, BILATERAL Bilateral   . ESOPHAGOGASTRODUODENOSCOPY N/A 10/12/2013   Procedure: ESOPHAGOGASTRODUODENOSCOPY (EGD);  Surgeon: Gwenyth Ober, MD;  Location: [facility];  Service: General;  Laterality: N/A;  bedside  . EYE SURGERY    . 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 HERNIA REPAIR    . INSERT / REPLACE / REMOVE PACEMAKER  2003/11/08   Secondary to symptomatic bradycardia. DDDR pacemaker. By Dr. Rollene Fare.  Randolm Idol / REPLACE / REMOVE PACEMAKER  November 07, 2012   "old pacemaker died; had new one put in" (24-Jun-2013)  . Pacemaker lead revision   12/2003    likely microdislodgment  of right ventricular lead  . PEG PLACEMENT N/A 10/12/2013   Procedure: PERCUTANEOUS ENDOSCOPIC GASTROSTOMY (PEG) PLACEMENT;  Surgeon: Gwenyth Ober, MD;  Location: [facility];  Service: General;  Laterality: N/A;  . PERMANENT PACEMAKER GENERATOR CHANGE N/A 11/03/2012   Procedure: PERMANENT PACEMAKER GENERATOR CHANGE;  Surgeon: Sanda Klein, MD;  Location: [facility];  Service: Cardiovascular;  Laterality: N/A;  . REFRACTIVE SURGERY Bilateral   . TRANSTHORACIC ECHOCARDIOGRAM  01/01/2013   EF 50-55%, ventricular septum-thickness moderately increased  . VENOUS DOPPLER  01/17/2013   No evidence of thrombus or thrombophlebitis. Left GSVs appear enlarged and demonstrate valvular insuffiiciency. Bilateral SSVs appeared patent with no valvular insufficiency seen.  Marland Kitchen VENTRAL HERNIA REPAIR  07/2005    S/P laparotomy, lysis of adhesions, repair of incarcerated ventral hernia with  partial omentectomy.. Performed by Dr. Marlou Starks.       Home Medications    Prior to Admission medications   Medication Sig Start Date End Date Taking? Authorizing Provider  acetaminophen (TYLENOL) 650  MG CR tablet Take 1,300 mg by mouth 2 (two) times daily.    Yes [provider]  albuterol (PROVENTIL HFA;VENTOLIN HFA) 108 (90 Base) MCG/ACT inhaler Inhale 2 puffs into the lungs every 6 (six) hours as needed for wheezing or shortness of breath.   Yes [provider]  amLODipine (NORVASC) 10 MG tablet Take 10 mg by mouth daily. Reported on 12/09/2015   Yes [provider]  aspirin 325 MG tablet Take 325 mg by mouth daily.   Yes [provider]  atorvastatin (LIPITOR) 40 MG tablet Take 1 tablet (40 mg total) by mouth daily. 04/16/15  Yes Croitoru, Mihai, MD  cholecalciferol (VITAMIN D) 1000 units tablet Take 1,000 Units by mouth daily.   Yes [provider]  citalopram (CELEXA) 20 MG tablet Take 20 mg by mouth daily.   Yes [provider]    Febuxostat 80 MG TABS Take 80 mg by mouth daily.   Yes [provider]  gabapentin (NEURONTIN) 300 MG capsule Take 300 mg by mouth 2 (two) times daily.   Yes [provider]  hydrALAZINE (APRESOLINE) 50 MG tablet Take 1 tablet (50 mg total) 3 (three) times daily by mouth. 06/24/17  Yes Helberg, Larkin Ina, MD  Insulin Glargine (TOUJEO SOLOSTAR) 300 UNIT/ML SOPN Inject 80 Units into the skin 2 (two) times daily.    Yes [provider]  lidocaine (LIDODERM) 5 % Place 1 patch onto the skin daily as needed (back pain). Remove & Discard patch within 12 hours or as directed by MD   Yes [provider]  Liraglutide (VICTOZA) 18 MG/3ML SOPN Inject 1.8 mg into the skin daily.   Yes [provider]  Melatonin 3 MG CAPS Take 1 capsule by mouth at bedtime as needed (sleep).    Yes [provider]  Menthol, Topical Analgesic, (BIOFREEZE EX) Apply 1 application topically 3 (three) times daily as needed (pain). Reported on 10/27/2015   Yes [provider]  methylcellulose (ARTIFICIAL TEARS) 1 % ophthalmic solution Place 1 drop into both eyes 2 (two) times daily as needed (dry eyes).    Yes [provider]  metolazone (ZAROXOLYN) 2.5 MG tablet Take 1 tablet (2.5 mg total) by mouth 3 (three) times a week. Monday, Wednesday, and Fridays Patient taking differently: Take 2.5-5 mg by mouth as directed. Take 2 tabs (5 mg) on (Tues & Thurs) & Take 1 tablet (2.5 mg) all other days of week. 08/08/17  Yes Larey Dresser, MD  metoprolol succinate (TOPROL-XL) 50 MG 24 hr tablet Take 1 tablet (50 mg total) daily by mouth. Take with or immediately following a meal. 06/25/17  Yes Helberg, Larkin Ina, MD  polyethylene glycol (MIRALAX / GLYCOLAX) packet Take 17 g by mouth daily as needed for moderate constipation.   Yes [provider]  potassium chloride SA (K-DUR,KLOR-CON) 20 MEQ tablet Take 60 mEq (3 Tabs) in the AM and 40 mEq (2 Tabs) in the PM. 08/08/17   Yes Larey Dresser, MD  ranitidine (ZANTAC) 150 MG tablet Take 150 mg by mouth daily.    Yes [provider]  sennosides-docusate sodium (SENOKOT-S) 8.6-50 MG tablet Take 1 tablet by mouth 2 (two) times daily.    Yes [provider]  Skin Protectants, Misc. (EUCERIN) cream Apply 1 application topically 2 (two) times daily.   Yes [provider]  torsemide (DEMADEX) 100 MG tablet Take 1 tablet (100 mg total) by mouth 2 (two) times daily. 07/08/15  Yes Croitoru, Mihai,  MD  Cholecalciferol (VITAMIN D-3) 1000 UNITS CAPS Take 1 capsule by mouth daily.    [provider]  meclizine (ANTIVERT) 25 MG tablet Take 1 tablet (25 mg total) by mouth 2 (two) times daily. Patient not taking: Reported on 08/15/2017 06/07/17   Fredia Sorrow, MD  ondansetron (ZOFRAN ODT) 4 MG disintegrating tablet Take 1 tablet (4 mg total) by mouth every 8 (eight) hours as needed for nausea or vomiting. Patient not taking: Reported on 08/15/2017 06/07/17   Fredia Sorrow, MD    Family History Family History  Problem Relation Age of Onset  . Coronary artery disease Sister   . Heart disease Sister   . Allergies Sister   . Asthma Brother   . Rheum arthritis Brother     Social History Social History   Tobacco Use  . Smoking status: Former Smoker    Packs/day: 0.00    Years: 0.00    Pack years: 0.00    Types: Cigarettes    Last attempt to quit: 08/17/2003    Years since quitting: 14.0  . Smokeless tobacco: Never Used  . Tobacco comment: started at age 65.  only smoked on occ- very rarely.0  Substance Use Topics  . Alcohol use: Yes    Comment: 06/23/2013 "drank some; never had a problem w/it"  . Drug use: No     Allergies   Patient has no known allergies.   Review of Systems Review of Systems 10 Systems reviewed and are negative for acute change except as noted in the HPI.   Physical Exam Updated Vital Signs BP (!) 158/98   Pulse 74   Temp 97.7 F (36.5 C)  (Oral)   Resp 20   Ht 5' 11.5" (1.816 m)   Wt (!) 137.9 kg (304 lb)   SpO2 94%   BMI 41.81 kg/m   Physical Exam  Constitutional: He is oriented to person, place, and time.  Patient is sitting on the edge of the stretcher.  When I first entered the room he was resting, lying on his side in a semi-recombinant position.  At that time his oxygen saturation was mid 80s with good waveform.  Upon awakening and interacting levels went up to mid 90s.  HENT:  Head: Normocephalic and atraumatic.  Eyes: Conjunctivae are normal.  Patient has bilateral proptotic eyes with widely dilated pupils.  Diffuse mild injection bilateral sclera.  Patient reports he is completely blind.  Neck: Neck supple.  Cardiovascular: Normal rate and regular rhythm.  No murmur heard. Pulmonary/Chest: Effort normal. No respiratory distress.  Breath sounds are diminished to the bases.  Soft breath sounds throughout with occasional wheeze.  Abdominal: Soft. There is no tenderness.  Abdomen is obese but soft and nontender.  Musculoskeletal: He exhibits edema.  Patient has approximately 1+-2+ pitting edema bilateral lower extremities.  Left forefoot has partial amputation.  Left lower extremity has skin changes of chronic venous stasis without any appearance of active wounds or cellulitis.  Neurological: He is alert and oriented to person, place, and time. Coordination abnormal.  Patient does have observable tremor with some quality of central tremor while sitting.  Exam and interview are conducted with the patient seated on the side of the bed.  Patient has asterixis.  He is however alert and appropriate.  He is situationally oriented.  Cognitive function intact.  Grip strength bilaterally 5\5.  Patient is not ambulated due to necessity for walker or cane and baseline gait instability.  Skin: Skin is warm and  dry.  Psychiatric: He has a normal mood and affect.  Nursing note and vitals reviewed.    ED Treatments / Results   Labs (all labs ordered are listed, but only abnormal results are displayed) Labs Reviewed  BASIC METABOLIC PANEL - Abnormal; Notable for the following components:      Result Value   Sodium 151 (*)    CO2 33 (*)    Glucose, Bld 121 (*)    BUN 66 (*)    Creatinine, Ser 2.75 (*)    GFR calc non Af Amer 22 (*)    GFR calc Af Amer 25 (*)    All other components within normal limits  CBC - Abnormal; Notable for the following components:   WBC 12.5 (*)    RBC 6.00 (*)    HCT 52.2 (*)    RDW 16.8 (*)    All other components within normal limits  URINALYSIS, ROUTINE W REFLEX MICROSCOPIC - Abnormal; Notable for the following components:   APPearance HAZY (*)    Leukocytes, UA LARGE (*)    Bacteria, UA RARE (*)    Squamous Epithelial / LPF 0-5 (*)    All other components within normal limits  BLOOD GAS, VENOUS - Abnormal; Notable for the following components:   pH, Ven 7.448 (*)    Bicarbonate 34.2 (*)    Acid-Base Excess 8.6 (*)    All other components within normal limits  URINE CULTURE  MAGNESIUM  PHOSPHORUS  BRAIN NATRIURETIC PEPTIDE  AMMONIA    EKG  EKG Interpretation None       Radiology Dg Chest 2 View  Result Date: 08/15/2017 CLINICAL DATA:  Chronic respiratory failure. EXAM: CHEST  2 VIEW COMPARISON:  Chest x-ray dated June 21, 2017. FINDINGS: Unchanged right chest wall pacer device. Stable moderate cardiomegaly. Pulmonary vascular congestion and interstitial edema appear improved when compared to prior study. No focal consolidation, pleural effusion, or pneumothorax. No acute osseous abnormality. IMPRESSION: Stable moderate cardiomegaly with improved vascular congestion and interstitial edema when compared to prior study. Electronically Signed   By: Titus Dubin M.D.   On: 08/15/2017 17:48    Procedures Procedures (including critical care time)  Medications Ordered in ED Medications  cefTRIAXone (ROCEPHIN) 1 g in dextrose 5 % 50 mL IVPB (0 g Intravenous  Stopped 08/15/17 1737)     Initial Impression / Assessment and Plan / ED Course  I have reviewed the triage vital signs and the nursing notes.  Pertinent labs & imaging results that were available during my care of the patient were reviewed by me and considered in my medical decision making (see chart for details).     Consult: Hospitalist for admission.  Final Clinical Impressions(s) / ED Diagnoses   Final diagnoses:  Hypernatremia  Asterixis  Acute cystitis without hematuria  Patient presents as outlined above with chief complaint of tremor.  He has asterixis and some appearance of central ataxia (patient is examined sitting up in the stretcher and is able to maintain normal upright posture but has subtle core movements).  Apparently, this is new.  The patient reports he has not had this until several days ago.  This will require further investigation.  At this point the exact etiology is unclear.  Patient has multiple causes for encephalopathy of various etiologies.  He is found to be hypernatremic, due to the patient's concomitant CHF he is chronically on diuretics.  Also urinalysis was grossly positive for UTI.  Rocephin initiated.  Plan will be  for admission for ongoing diagnostic evaluation and treatment.  ED Discharge Orders    None       Charlesetta Shanks, MD 08/15/17 [phone]

## 2017-08-15 NOTE — H&P (Signed)
History and Physical    Brailon Fletcher CBJ:[id number] DOB: Mar 05, 1946 DOA: 08/15/2017  PCP: [name], MD   Patient coming from: SNF    Chief Complaint: Tremors,Back pain  HPI: [name] is a 71 y.o. male with medical history significant of paroxysmal atrial fibrillation, chronic diastolic heart failure, hypertension, hyperlipidemia, diabetes mellitus, legal blindness, chronic kidney disease who presented from skilled nursing facility with complaints of increased tremors, weakness and back pain. Patient was apparently all right about 2-3 days ago.  Since last couple of days he has been increasingly shaky.  He feels very dry and dehydrated.  He was also complaining of pain on the backside when he ambulates.  Patient was recently discharged from unit on 06/24/17 after management of acute hypoxic respiratory failure secondary to pulmonary edema.  He was intubated at that time.  Patient was then discharged to skilled nursing facility. Patient never had any history of tremors in the past.  Patient is a poor historian.  He is legally blind usually is taken care of by his brother but at present he is on a skilled nursing facility. Patient was found to be severely hypernatremic on presentation.  Urinalysis was also suggestive of UTI but patient denies any history of dysuria.  Patient reported poor oral intake and he does not usually take that much of water.  He looks clinically dehydrated.  Tongue was dry. Patient was observed to have tremors of the hand.  Patient denies any chest pain, shortness of breath, palpitations, abdominal pain, nausea, vomiting, diarrhea, fever or chills.  ED Course: Was given 1 g of IV ceftriaxone for possible urinary tract infection.  Review of Systems: As per HPI otherwise 10 point review of systems negative.    Past Medical History:  Diagnosis Date  . Arthritis    "all over" (06/23/2013)  . Atrial fibrillation ([place])    not Candidate for Coumadin d/t  rectal bleeding  . Benign prostatic hypertrophy   . Cardiomyopathy, hypertensive ([place])     diastolic dysfunction by 1607 echo  . Coronary artery disease     nonobstructive. 2 vessel. cardiac catheter in March 2005 showing 60% LAD occlusion, 30% circumflex occlusion.  . Degenerative joint disease     Right knee.  . Diabetes type 2, uncontrolled ([place])   . Diabetic peripheral neuropathy ([place])   . Diabetic ulcer of thigh ([place])    H/O Rt thigh ulcer.  . Exertional shortness of breath    "comes and goes" (06/23/2013)  . GERD (gastroesophageal reflux disease)   . Gout   . Herpes   . Hyperlipidemia   . Hypertension   . Hypotension 09/05/2013  . Incarcerated ventral hernia    H/O. S/P repair ventral hernia repair 07/2005.  Marland Kitchen LBBB (left bundle branch block)   . Legally blind   . Morbid obesity ([place])   . Obstructive sleep apnea    On CPAP. Last sleep study (06/2009) - Severe obstructive sleep apnea/hypopnea syndrome, apnea-hypopnea index 70.5 per hour with non positional events moderately loud snoring and oxygen desaturation to a nadir of 85% on room air.  . Occult blood positive stool     History of in August 2007.  colonoscopy in January 2008 showing normal colon with fair inadequate prep. By Dr. Silvano Rusk.  . 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 Sepsis ([place]) 08/26/2013    Past Surgical History:  Procedure Laterality Date  . AMPUTATION Left 06/22/2013   Procedure: AMPUTATION FOOT;  Surgeon: Beverely Low  Fernanda Drum, MD;  Location: [facility];  Service: Orthopedics;  Laterality: Left;  Left Midfoot Amputation  . CARDIAC CATHETERIZATION  10/22/2003   Dilatation of the LV with preserved LV systolic function. Tortous aorta, but no AAA. Moderate disease with 50-60% area of narrowing in the circumflex after the second OM and 70% mid narrowing in the LAD. No intervention.  Marland Kitchen CARDIOVASCULAR STRESS TEST  01/23/2010   Perfusion defect seen in inferior myocardial region is consistent with diaphragmatic  attenuation. Remaining myocardium demonstrates normal myocardial perfusion with no evidence of ischemia or infarct. Stress ECG was non-diagnostic due to LBBB.  Marland Kitchen CATARACT EXTRACTION, BILATERAL Bilateral   . ESOPHAGOGASTRODUODENOSCOPY N/A 10/12/2013   Procedure: ESOPHAGOGASTRODUODENOSCOPY (EGD);  Surgeon: Gwenyth Ober, MD;  Location: [facility];  Service: General;  Laterality: N/A;  bedside  . EYE SURGERY    . 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 HERNIA REPAIR    . INSERT / REPLACE / REMOVE PACEMAKER  11-Nov-2003   Secondary to symptomatic bradycardia. DDDR pacemaker. By Dr. Rollene Fare.  Randolm Idol / REPLACE / REMOVE PACEMAKER  11/10/12   "old pacemaker died; had new one put in" (2013-06-27)  . Pacemaker lead revision  12/2003    likely microdislodgment  of right ventricular lead  . PEG PLACEMENT N/A 10/12/2013   Procedure: PERCUTANEOUS ENDOSCOPIC GASTROSTOMY (PEG) PLACEMENT;  Surgeon: Gwenyth Ober, MD;  Location: [facility];  Service: General;  Laterality: N/A;  . PERMANENT PACEMAKER GENERATOR CHANGE N/A 11/03/2012   Procedure: PERMANENT PACEMAKER GENERATOR CHANGE;  Surgeon: Sanda Klein, MD;  Location: [facility] CATH LAB;  Service: Cardiovascular;  Laterality: N/A;  . REFRACTIVE SURGERY Bilateral   . TRANSTHORACIC ECHOCARDIOGRAM  01/01/2013   EF 50-55%, ventricular septum-thickness moderately increased  . VENOUS DOPPLER  01/17/2013   No evidence of thrombus or thrombophlebitis. Left GSVs appear enlarged and demonstrate valvular insuffiiciency. Bilateral SSVs appeared patent with no valvular insufficiency seen.  Marland Kitchen VENTRAL HERNIA REPAIR  07/2005    S/P laparotomy, lysis of adhesions, repair of incarcerated ventral hernia with  partial omentectomy.. Performed by Dr. Marlou Starks.     reports that he quit smoking about 14 years ago. His smoking use included cigarettes. He smoked 0.00 packs per day for 0.00 years. he has never used smokeless tobacco. He reports that he drinks  alcohol. He reports that he does not use drugs.  No Known Allergies  Family History  Problem Relation Age of Onset  . Coronary artery disease Sister   . Heart disease Sister   . Allergies Sister   . Asthma Brother   . Rheum arthritis Brother      Prior to Admission medications   Medication Sig Start Date End Date Taking? Authorizing Provider  acetaminophen (TYLENOL) 650 MG CR tablet Take 1,300 mg by mouth 2 (two) times daily.    Yes [provider]  albuterol (PROVENTIL HFA;VENTOLIN HFA) 108 (90 Base) MCG/ACT inhaler Inhale 2 puffs into the lungs every 6 (six) hours as needed for wheezing or shortness of breath.   Yes [provider]  amLODipine (NORVASC) 10 MG tablet Take 10 mg by mouth daily. Reported on 12/09/2015   Yes [provider]  aspirin 325 MG tablet Take 325 mg by mouth daily.   Yes [provider]  atorvastatin (LIPITOR) 40 MG tablet Take 1 tablet (40 mg total) by mouth daily. 04/16/15  Yes Croitoru, Mihai, MD  cholecalciferol (VITAMIN D) 1000 units tablet Take 1,000 Units by mouth daily.  Yes [provider]  citalopram (CELEXA) 20 MG tablet Take 20 mg by mouth daily.   Yes [provider]  Febuxostat 80 MG TABS Take 80 mg by mouth daily.   Yes [provider]  gabapentin (NEURONTIN) 300 MG capsule Take 300 mg by mouth 2 (two) times daily.   Yes [provider]  hydrALAZINE (APRESOLINE) 50 MG tablet Take 1 tablet (50 mg total) 3 (three) times daily by mouth. 06/24/17  Yes Helberg, Larkin Ina, MD  Insulin Glargine (TOUJEO SOLOSTAR) 300 UNIT/ML SOPN Inject 80 Units into the skin 2 (two) times daily.    Yes [provider]  lidocaine (LIDODERM) 5 % Place 1 patch onto the skin daily as needed (back pain). Remove & Discard patch within 12 hours or as directed by MD   Yes [provider]  Liraglutide (VICTOZA) 18 MG/3ML SOPN Inject 1.8 mg into the skin daily.   Yes [provider]    Melatonin 3 MG CAPS Take 1 capsule by mouth at bedtime as needed (sleep).    Yes [provider]  Menthol, Topical Analgesic, (BIOFREEZE EX) Apply 1 application topically 3 (three) times daily as needed (pain). Reported on 10/27/2015   Yes [provider]  methylcellulose (ARTIFICIAL TEARS) 1 % ophthalmic solution Place 1 drop into both eyes 2 (two) times daily as needed (dry eyes).    Yes [provider]  metolazone (ZAROXOLYN) 2.5 MG tablet Take 1 tablet (2.5 mg total) by mouth 3 (three) times a week. Monday, Wednesday, and Fridays Patient taking differently: Take 2.5-5 mg by mouth as directed. Take 2 tabs (5 mg) on (Tues & Thurs) & Take 1 tablet (2.5 mg) all other days of week. 08/08/17  Yes Larey Dresser, MD  metoprolol succinate (TOPROL-XL) 50 MG 24 hr tablet Take 1 tablet (50 mg total) daily by mouth. Take with or immediately following a meal. 06/25/17  Yes Helberg, Larkin Ina, MD  polyethylene glycol (MIRALAX / GLYCOLAX) packet Take 17 g by mouth daily as needed for moderate constipation.   Yes [provider]  potassium chloride SA (K-DUR,KLOR-CON) 20 MEQ tablet Take 60 mEq (3 Tabs) in the AM and 40 mEq (2 Tabs) in the PM. 08/08/17  Yes Larey Dresser, MD  ranitidine (ZANTAC) 150 MG tablet Take 150 mg by mouth daily.    Yes [provider]  sennosides-docusate sodium (SENOKOT-S) 8.6-50 MG tablet Take 1 tablet by mouth 2 (two) times daily.    Yes [provider]  Skin Protectants, Misc. (EUCERIN) cream Apply 1 application topically 2 (two) times daily.   Yes [provider]  torsemide (DEMADEX) 100 MG tablet Take 1 tablet (100 mg total) by mouth 2 (two) times daily. 07/08/15  Yes Croitoru, Mihai, MD  Cholecalciferol (VITAMIN D-3) 1000 UNITS CAPS Take 1 capsule by mouth daily.    [provider]  meclizine (ANTIVERT) 25 MG tablet Take 1 tablet (25 mg total) by mouth 2 (two) times daily. Patient not taking: Reported on  08/15/2017 06/07/17   Fredia Sorrow, MD  ondansetron (ZOFRAN ODT) 4 MG disintegrating tablet Take 1 tablet (4 mg total) by mouth every 8 (eight) hours as needed for nausea or vomiting. Patient not taking: Reported on 08/15/2017 06/07/17   Fredia Sorrow, MD    Physical Exam: Vitals:   08/15/17 1455 08/15/17 1545 08/15/17 1746 08/15/17 1756  BP:  (!) 139/96 (!) 158/98   Pulse:  66 74   Resp:  20 20   Temp:  TempSrc:      SpO2:  96% 92% 94%  Weight: (!) 137.9 kg (304 lb)     Height: 5' 11.5" (1.816 m)       Constitutional: NAD, calm, comfortable Vitals:   08/15/17 1455 08/15/17 1545 08/15/17 1746 08/15/17 1756  BP:  (!) 139/96 (!) 158/98   Pulse:  66 74   Resp:  20 20   Temp:      TempSrc:      SpO2:  96% 92% 94%  Weight: (!) 137.9 kg (304 lb)     Height: 5' 11.5" (1.816 m)      Eyes: Legally blind, lids and conjunctivae normal ENMT: Mucous membranes are dry. Posterior pharynx clear of any exudate or lesions.Normal dentition.  Neck: normal, supple, no masses, no thyromegaly Respiratory: clear to auscultation bilaterally, no wheezing, no crackles. Normal respiratory effort. No accessory muscle use.  Cardiovascular: Regular rate and rhythm, no murmurs / rubs / gallops. No extremity edema. 2+ pedal pulses. No carotid bruits.  Abdomen: no tenderness, no masses palpated. No hepatosplenomegaly. Bowel sounds positive.  Musculoskeletal: no clubbing / cyanosis. No joint deformity upper and lower extremities. Good ROM, no contractures. Normal muscle tone.  Coarse tremors of bilateral hands. Skin: no rashes, lesions, ulcers. No induration, chronic venous stasis changes in the bilateral lower extremity Neurologic: CN 2-12 grossly intact. Sensation intact, DTR normal. Strength 5/5 in all 4.  Psychiatric: Normal judgment and insight. Alert and oriented x 3. Normal mood.   Foley Catheter:  Labs on Admission: I have personally reviewed following labs and imaging  studies  CBC: Recent Labs  Lab 08/15/17 1515  WBC 12.5*  HGB 16.1  HCT 52.2*  MCV 87.0  PLT 694   Basic Metabolic Panel: Recent Labs  Lab 08/15/17 1515  NA 151*  K 3.8  CL 107  CO2 33*  GLUCOSE 121*  BUN 66*  CREATININE 2.75*  CALCIUM 10.0  MG 2.1  PHOS 3.1   GFR: Estimated Creatinine Clearance: 36.3 mL/min (A) (by C-G formula based on SCr of 2.75 mg/dL (H)). Liver Function Tests: No results for input(s): AST, ALT, ALKPHOS, BILITOT, PROT, ALBUMIN in the last 168 hours. No results for input(s): LIPASE, AMYLASE in the last 168 hours. No results for input(s): AMMONIA in the last 168 hours. Coagulation Profile: No results for input(s): INR, PROTIME in the last 168 hours. Cardiac Enzymes: No results for input(s): CKTOTAL, CKMB, CKMBINDEX, TROPONINI in the last 168 hours. BNP (last 3 results) No results for input(s): PROBNP in the last 8760 hours. HbA1C: No results for input(s): HGBA1C in the last 72 hours. CBG: No results for input(s): GLUCAP in the last 168 hours. Lipid Profile: No results for input(s): CHOL, HDL, LDLCALC, TRIG, CHOLHDL, LDLDIRECT in the last 72 hours. Thyroid Function Tests: No results for input(s): TSH, T4TOTAL, FREET4, T3FREE, THYROIDAB in the last 72 hours. Anemia Panel: No results for input(s): VITAMINB12, FOLATE, FERRITIN, TIBC, IRON, RETICCTPCT in the last 72 hours. Urine analysis:    Component Value Date/Time   COLORURINE YELLOW 08/15/2017 1527   APPEARANCEUR HAZY (A) 08/15/2017 1527   LABSPEC 1.010 08/15/2017 1527   PHURINE 7.0 08/15/2017 1527   GLUCOSEU NEGATIVE 08/15/2017 1527   GLUCOSEU > 1000 mg/dL (A) 10/30/2009 0142   HGBUR NEGATIVE 08/15/2017 1527   HGBUR trace-lysed 03/25/2010 [place] 08/15/2017 [place] 08/15/2017 1527   PROTEINUR NEGATIVE 08/15/2017 1527   UROBILINOGEN 0.2 11/04/2013 1944   NITRITE NEGATIVE 08/15/2017 1527  LEUKOCYTESUR LARGE (A) 08/15/2017 1527    Radiological  Exams on Admission: Dg Chest 2 View  Result Date: 08/15/2017 CLINICAL DATA:  Chronic respiratory failure. EXAM: CHEST  2 VIEW COMPARISON:  Chest x-ray dated June 21, 2017. FINDINGS: Unchanged right chest wall pacer device. Stable moderate cardiomegaly. Pulmonary vascular congestion and interstitial edema appear improved when compared to prior study. No focal consolidation, pleural effusion, or pneumothorax. No acute osseous abnormality. IMPRESSION: Stable moderate cardiomegaly with improved vascular congestion and interstitial edema when compared to prior study. Electronically Signed   By: Titus Dubin M.D.   On: 08/15/2017 17:48     Assessment/Plan Principal Problem:   Hypernatremia Active Problems:   Hyperlipidemia   Coronary artery disease involving native coronary artery of native heart without angina pectoris   OSA (obstructive sleep apnea)   PAF (paroxysmal atrial fibrillation) (HCC)   CKD (chronic kidney disease) stage 3, GFR 30-59 ml/min (HCC)   Hypertension   Chronic systolic CHF (congestive heart failure) (H[facility])   AKI (acute kidney injury) ([place])   Acute lower UTI   Coarse tremors  Hypernatremia: Looks clinically dehydrated.  Will start on half normal saline.  We will continue to monitor his sodium level.  Tremors: Unknown etiology.  Will follow-up CT head.  Will check ammonia level  Acute kidney injury on CKD: Most likely secondary to overdiuresis/volume depletion/poor oral intake.  We will continue gentle hydration with half normal saline.  Baseline creatinine is around 2.  He was on metolazone and torsemide at home. Patient follows with nephrology for the management of his CKD.  Urinary tract infection: Previous history of UTI.  Patient denies dysuria.  Urine culture sent.  We will continue ceftriaxone for now.  Heart failure with preserved ejection fraction:Currently compensated.  Patient has high risk for developing pulmonary edema/exacerbation of CHF so will be  careful with the fluids.  Paroxysmal A. fib: Currently rate is controlled.  On metoprolol at home which we will continue.  Leukocytosis: Could be associated with the UTI.  We will continue to monitor.  Diabetes mellitus: On insulin at home.  We will continue that along with sliding-scale insulin.  We will continue to monitor his blood glucose.  Obstructive sleep apnea: On CPAP at home.  We will continue it.  Hypertension: We will resume his home medications.  Currently blood pressure stable  Hyperlipidemia: We will continue statin        Severity of Illness:  I certify that at the point of admission it is my clinical judgment that the patient will require inpatient hospital care spanning beyond 2 midnights from the point of admission due to high intensity of service, high risk for further deterioration and high frequency of surveillance required.    DVT prophylaxis: Heparin [place] Code Status: Full Family Communication: None present on the bed side Consults called: None     Marene Lenz MD [facility] Pager [phone]  If 7PM-7AM, please contact night-coverage [email or web address] Password [email or web address]  08/15/2017, 5:59 PM

## 2017-08-15 NOTE — ED Notes (Signed)
Pt's chief compliant today is intermittent tremors that have gotten worse over the past few days. Pt reports compliance with prescribed medications and denies any acute pain at this time. Pt is totally blind. Pt also reports posterior headache that has been persistent the last few days.

## 2017-08-15 NOTE — ED Triage Notes (Signed)
Patient states he was at [facility] where he received therapy and was unable to hold a cup of water and a posterior headache. Patient states he has had these symptoms, but is worse now.

## 2017-08-15 NOTE — ED Notes (Signed)
Oxygen applied via nasal cannula 2 L at this time due to low oxygen saturation. See ongoing vitals.

## 2017-08-15 NOTE — ED Notes (Signed)
Bed: WA23 Expected date:  Expected time:  Means of arrival:  Comments: Triage 1 

## 2017-08-15 NOTE — ED Triage Notes (Signed)
Pt with hx of tremors and pt states tremors have been increasing. Tremors have been constant, glucose: 173, pt is blind. Pt with hx of stroke and has aphasia.

## 2017-08-15 NOTE — Progress Notes (Signed)
CSW received a call from [place] at ph: [phone].  Miss [name] is a Education officer, museum with [employer] [facility] and Miss [name] wanted ED/RN personnel to be aware that [facility] acts as the [employer] provider and also works to assist the patient with any medical needs, should any needs or questions arise.  Per Miss [name] pt's medication list has been faxed to the [facility] but if anything else is needed after-hours or on New Years Day when the [facility] offices are closed [facility] can be reached at their on-call number for any needs at [phone].  Please reconsult if future social work needs arise.  CSW signing off, as social work intervention is no longer needed.  [name]. Ryan Flippen, LCSW, LCAS, CSI Clinical Social Worker Ph: [phone]

## 2017-08-15 NOTE — ED Notes (Signed)
Hospitalist at bedside speaking with patient.

## 2017-08-15 NOTE — ED Notes (Signed)
Patient transported to X-ray 

## 2017-08-15 NOTE — ED Notes (Signed)
Patient transported to CT 

## 2017-08-16 LAB — COMPREHENSIVE METABOLIC PANEL
ALK PHOS: 78 U/L (ref 38–126)
ALT: 26 U/L (ref 17–63)
AST: 26 U/L (ref 15–41)
Albumin: 3.2 g/dL — ABNORMAL LOW (ref 3.5–5.0)
Anion gap: 12 (ref 5–15)
BUN: 60 mg/dL — AB (ref 6–20)
CALCIUM: 8.9 mg/dL (ref 8.9–10.3)
CHLORIDE: 101 mmol/L (ref 101–111)
CO2: 30 mmol/L (ref 22–32)
CREATININE: 2.38 mg/dL — AB (ref 0.61–1.24)
GFR, EST AFRICAN AMERICAN: 30 mL/min — AB (ref >60)
GFR, EST NON AFRICAN AMERICAN: 26 mL/min — AB (ref >60)
Glucose, Bld: 65 mg/dL (ref 65–99)
Potassium: 3.1 mmol/L — ABNORMAL LOW (ref 3.5–5.1)
Sodium: 143 mmol/L (ref 135–145)
Total Bilirubin: 1 mg/dL (ref 0.3–1.2)
Total Protein: 6.7 g/dL (ref 6.5–8.1)

## 2017-08-16 LAB — GLUCOSE, CAPILLARY
GLUCOSE-CAPILLARY: 75 mg/dL (ref 65–99)
GLUCOSE-CAPILLARY: 101 mg/dL — AB (ref 65–99)
GLUCOSE-CAPILLARY: 105 mg/dL — AB (ref 65–99)
Glucose-Capillary: 105 mg/dL — ABNORMAL HIGH (ref 65–99)

## 2017-08-16 LAB — HEMOGLOBIN A1C
Hgb A1c MFr Bld: 7.1 % — ABNORMAL HIGH (ref 4.8–5.6)
MEAN PLASMA GLUCOSE: 157.07 mg/dL

## 2017-08-16 MED ORDER — POLYVINYL ALCOHOL 1.4 % OP SOLN
1.0000 [drp] | OPHTHALMIC | Status: DC | PRN
  Administered 2017-08-16 – 2017-08-17 (×2): 1 [drp] via OPHTHALMIC
  Filled 2017-08-16: qty 15

## 2017-08-16 MED ORDER — POTASSIUM CHLORIDE CRYS ER 20 MEQ PO TBCR
40.0000 meq | EXTENDED_RELEASE_TABLET | Freq: Once | ORAL | Status: AC
  Administered 2017-08-16: 40 meq via ORAL
  Filled 2017-08-16: qty 2

## 2017-08-16 MED ORDER — INSULIN GLARGINE 100 UNIT/ML ~~LOC~~ SOLN
40.0000 [IU] | Freq: Every day | SUBCUTANEOUS | Status: DC
  Administered 2017-08-16: 40 [IU] via SUBCUTANEOUS
  Filled 2017-08-16 (×2): qty 0.4

## 2017-08-16 NOTE — Progress Notes (Addendum)
PROGRESS NOTE    [name]  ZDG:[id number] DOB: [birth year] DOA: 08/15/2017 PCP: [name], MD   Brief Narrative: [name] is a 72 y.o. male with medical history significant of paroxysmal atrial fibrillation, chronic diastolic heart failure, hypertension, hyperlipidemia, diabetes mellitus, legal blindness, chronic kidney disease who presented from skilled nursing facility with complaints of increased tremors, weakness and back pain. Patient was admitted for the management of hypernatremia ,possible UTI and acute kidney injury on CKD. Sodium level has trended down today.  Kidney function is improving.  He looks much better this morning.   Assessment & Plan:   Principal Problem:   Hypernatremia Active Problems:   Hyperlipidemia   Coronary artery disease involving native coronary artery of native heart without angina pectoris   OSA (obstructive sleep apnea)   PAF (paroxysmal atrial fibrillation) (HCC)   CKD (chronic kidney disease) stage 3, GFR 30-59 ml/min (HCC)   Hypertension   Chronic systolic CHF (congestive heart failure) (HCC)   AKI (acute kidney injury) ([place])   Acute lower UTI   Coarse tremors   Hypernatremia:Improving. Looked clinically dehydrated on presentation. Continue  half normal saline for today.  Tremors: Unknown etiology.Resolved.    Head CT did not show any acute intracranial abnormalities.  Acute kidney injury on CKD: Improving.Most likely secondary to overdiuresis/volume depletion/poor oral intake.  We will continue gentle hydration with half normal saline.  Baseline creatinine is around 2.  He was on metolazone and torsemide at home which are on hold.  We might need to reduce the doses of these medications on discharge. Patient follows with nephrology for the management of his CKD.  Urinary tract infection: UA suggestive of urinary tract infection.  Patient denies dysuria. Urine culture sent.  We will continue ceftriaxone for  now.  Heart failure with preserved ejection fraction:Currently compensated.  Patient has high risk for developing pulmonary edema/exacerbation of CHF so will be careful with the fluids.  Paroxysmal A. fib: Currently rate is controlled.  On metoprolol at home which we will continue.  Leukocytosis: Could be associated with the UTI.  We will continue to monitor.  Diabetes mellitus: On insulin at home.  We will continue that along with sliding-scale insulin.  We will continue to monitor his blood glucose.  Obstructive sleep apnea: On CPAP at home.  We will continue it.  Hypertension: We will resume his home medications.  Currently blood pressure stable  Hyperlipidemia: We will continue statin  Generalized weakness: Most likely secondary to urinary tract infection/dehydration.  Will request physical therapy evaluation     DVT prophylaxis:Heparin Code Status: Full Family Communication: Daughter is present on the bedside Disposition Plan: Likely home tomorrow. Awaiting PT   Consultants: None  Procedures:None  Antimicrobials: Ceftriaxone since 08/15/17  Subjective:  Patient seen and examined at the bedside this afternoon.  Appears much comfortable today.  Denies any new complaints.  Objective: Vitals:   08/15/17 2251 08/15/17 2349 08/16/17 0410 08/16/17 1300  BP: 139/69  139/85 (!) 155/91  Pulse:  64 64 63  Resp:  20 20 18   Temp:   98.1 F (36.7 C) 98.1 F (36.7 C)  TempSrc:   Axillary Axillary  SpO2:  95% 99% 98%  Weight:      Height:        Intake/Output Summary (Last 24 hours) at 08/16/2017 1438 Last data filed at 08/16/2017 1109 Gross per 24 hour  Intake 1472.5 ml  Output 400 ml  Net 1072.5 ml   [employer]  08/15/17 1455 08/15/17 2019  Weight: (!) 137.9 kg (304 lb) 133.7 kg (294 lb 12.1 oz)    Examination:  General exam: Appears calm and comfortable ,Not in distress,obese, legally blind Respiratory system: Bilateral equal air entry, normal  vesicular breath sounds, no wheezes or crackles  Cardiovascular system: S1 & S2 heard, RRR. No JVD, murmurs, rubs, gallops or clicks. No pedal edema. Gastrointestinal system: Abdomen is nondistended, soft and nontender. No organomegaly or masses felt. Normal bowel sounds heard. Central nervous system: Alert and oriented. No focal neurological deficits. Extremities: No edema, no clubbing ,no cyanosis, distal peripheral pulses palpable. Skin: No rashes, lesions or ulcers,no icterus ,no pallor Psychiatry: Judgement and insight appear normal. Mood & affect appropriate.     Data Reviewed: I have personally reviewed following labs and imaging studies  CBC: Recent Labs  Lab 08/15/17 1515  WBC 12.5*  HGB 16.1  HCT 52.2*  MCV 87.0  PLT 993   Basic Metabolic Panel: Recent Labs  Lab 08/15/17 1515 08/16/17 0506  NA 151* 143  K 3.8 3.1*  CL 107 101  CO2 33* 30  GLUCOSE 121* 65  BUN 66* 60*  CREATININE 2.75* 2.38*  CALCIUM 10.0 8.9  MG 2.1  --   PHOS 3.1  --    GFR: Estimated Creatinine Clearance: 41.2 mL/min (A) (by C-G formula based on SCr of 2.38 mg/dL (H)). Liver Function Tests: Recent Labs  Lab 08/16/17 0506  AST 26  ALT 26  ALKPHOS 78  BILITOT 1.0  PROT 6.7  ALBUMIN 3.2*   No results for input(s): LIPASE, AMYLASE in the last 168 hours. Recent Labs  Lab 08/15/17 1739  AMMONIA 20   Coagulation Profile: No results for input(s): INR, PROTIME in the last 168 hours. Cardiac Enzymes: No results for input(s): CKTOTAL, CKMB, CKMBINDEX, TROPONINI in the last 168 hours. BNP (last 3 results) No results for input(s): PROBNP in the last 8760 hours. HbA1C: Recent Labs    08/15/17 2024  HGBA1C 7.1*   CBG: Recent Labs  Lab 08/15/17 2053 08/15/17 2241 08/16/17 0731 08/16/17 1149  GLUCAP 88 136* 75 101*   Lipid Profile: No results for input(s): CHOL, HDL, LDLCALC, TRIG, CHOLHDL, LDLDIRECT in the last 72 hours. Thyroid Function Tests: No results for input(s): TSH,  T4TOTAL, FREET4, T3FREE, THYROIDAB in the last 72 hours. Anemia Panel: No results for input(s): VITAMINB12, FOLATE, FERRITIN, TIBC, IRON, RETICCTPCT in the last 72 hours. Sepsis Labs: No results for input(s): PROCALCITON, LATICACIDVEN in the last 168 hours.  No results found for this or any previous visit (from the past 240 hour(s)).       Radiology Studies: Dg Chest 2 View  Result Date: 08/15/2017 CLINICAL DATA:  Chronic respiratory failure. EXAM: CHEST  2 VIEW COMPARISON:  Chest x-ray dated June 21, 2017. FINDINGS: Unchanged right chest wall pacer device. Stable moderate cardiomegaly. Pulmonary vascular congestion and interstitial edema appear improved when compared to prior study. No focal consolidation, pleural effusion, or pneumothorax. No acute osseous abnormality. IMPRESSION: Stable moderate cardiomegaly with improved vascular congestion and interstitial edema when compared to prior study. Electronically Signed   By: Titus Dubin M.D.   On: 08/15/2017 17:48   Ct Head Wo Contrast  Result Date: 08/15/2017 CLINICAL DATA:  Posterior headache for the past 2 days. Intermittent tremors. EXAM: CT HEAD WITHOUT CONTRAST TECHNIQUE: Contiguous axial images were obtained from the base of the skull through the vertex without intravenous contrast. COMPARISON:  CT head dated June 07, 2017. FINDINGS: Brain: No evidence  of acute infarction, hemorrhage, hydrocephalus, extra-axial collection or mass lesion/mass effect. 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 Unchanged enlarged pituitary gland, measuring 1 cm. Vascular: Calcified atherosclerosis at the skullbase. No hyperdense vessel. Skull: No fracture or focal lesion. Sinuses/Orbits: Mild mucosal thickening of the ethmoid air cells and right sphenoid sinus. The mastoid air cells are clear. No  acute orbital abnormality. Unchanged bilateral proptosis. Other: None. IMPRESSION: 1. No acute intracranial abnormality. Stable atrophy and chronic microvascular ischemic white matter disease. 2. Unchanged enlarged pituitary gland, measuring 1 cm. No further imaging evaluation or follow-up is necessary. Consider endocrine function tests and correlate for history of pituitary hypersecretion. This follows ACR consensus guidelines: Management of Incidental Pituitary Findings on CT, MRI and F18-FDG PET: A White Paper of the ACR Incidental Findings Committee. J Am Coll Radiol 2018; 15: 419-62. Electronically Signed   By: Titus Dubin M.D.   On: 08/15/2017 18:19        Scheduled Meds: . amLODipine  10 mg Oral Daily  . aspirin  325 mg Oral Daily  . atorvastatin  40 mg Oral Daily  . cholecalciferol  1,000 Units Oral Daily  . citalopram  20 mg Oral Daily  . famotidine  20 mg Oral Daily  . febuxostat  80 mg Oral Daily  . gabapentin  300 mg Oral BID  . heparin  5,000 Units Subcutaneous Q8H  . hydrALAZINE  50 mg Oral TID  . hydrocerin   Topical BID  . insulin aspart  0-20 Units Subcutaneous TID WC  . insulin glargine  40 Units Subcutaneous QHS  . metoprolol succinate  50 mg Oral Daily  . potassium chloride SA  20 mEq Oral Daily  . potassium chloride  40 mEq Oral Once  . senna-docusate  1 tablet Oral BID   Continuous Infusions: . sodium chloride 75 mL/hr at 08/16/17 1109  . cefTRIAXone (ROCEPHIN)  IV       LOS: 1 day    Time spent:     Marene Lenz, MD [facility] Pager [phone]  If 7PM-7AM, please contact night-coverage [email or web address] Password [facility] 08/16/2017, 2:38 PM

## 2017-08-16 NOTE — Clinical Social Work Note (Signed)
Clinical Social Work Assessment  Patient Details  Name: [name] MRN: [id number] Date of Birth: 03-12-1946  Date of referral:  08/16/17               Reason for consult:  Discharge Planning                Permission sought to share information with:    Permission granted to share information::     Name::        Agency::     Relationship::     Contact Information:     Housing/Transportation Living arrangements for the past 2 months:  Apartment, Skilled Nursing Facility(Patient recently discharged from [facility] approx a month ago ) Source of Information:  Patient Patient Interpreter Needed:  None Criminal Activity/Legal Involvement Pertinent to Current Situation/Hospitalization:  No - Comment as needed Significant Relationships:  Siblings Lives with:  Self, Siblings Do you feel safe going back to the place where you live?  Yes Need for family participation in patient care:  No (Coment)  Care giving concerns:  Patient reported none. Patient reported that he is currently residing with his brother until his apartment is ready. Patient reported that he was recently at [facility] for [facility] rehab and that he completed it and dc home. [facility] RN confirmed that patient recently dc from SNF and will dc back to his brother's home when he is ready for dc.    Social Worker assessment / plan:  CSW spoke with patient at bedside regarding discharge planning. Patient reported that he will dc back to his brother's home when he is medically stable. Patient reported that he will stay with his brother until his apartment is ready.   Patient will dc home. CSW signing off, no other needs identified at this time.   Employment status:  Retired Forensic scientist:  Other (Comment Required)([facility]) PT Recommendations:  Not assessed at this time Information / Referral to community resources:  Other (Comment Required)(n/a)  Patient/Family's Response to care:  Patient  reported that he is agreeable to dc home. Patient appreciative of CSW speaking with him.   Patient/Family's Understanding of and Emotional Response to Diagnosis, Current Treatment, and Prognosis:  Patient presented calm and verbalized some understanding of diagnosis and current treatment. Patient verbalized plan to discharge to brother's home when medically stable.   Emotional Assessment Appearance:  Appears stated age Attitude/Demeanor/Rapport:  Other(Cooperative) Affect (typically observed):  Calm Orientation:  Oriented to Self, Oriented to Place, Oriented to Situation, Oriented to  Time Alcohol / Substance use:  Not Applicable Psych involvement (Current and /or in the community):  No (Comment)  Discharge Needs  Concerns to be addressed:  No discharge needs identified Readmission within the last 30 days:  No Current discharge risk:  None Barriers to Discharge:  Continued Medical Work up   [employer], LCSW 08/16/2017, 10:36 AM

## 2017-08-17 DIAGNOSIS — E870 Hyperosmolality and hypernatremia: Principal | ICD-10-CM

## 2017-08-17 LAB — CBC WITH DIFFERENTIAL/PLATELET
Basophils Absolute: 0 10*3/uL (ref 0.0–0.1)
Basophils Relative: 0 %
EOS ABS: 0.1 10*3/uL (ref 0.0–0.7)
EOS PCT: 1 %
HCT: 44.1 % (ref 39.0–52.0)
HEMOGLOBIN: 13.6 g/dL (ref 13.0–17.0)
Lymphocytes Relative: 30 %
Lymphs Abs: 3.1 10*3/uL (ref 0.7–4.0)
MCH: 26.1 pg (ref 26.0–34.0)
MCHC: 30.8 g/dL (ref 30.0–36.0)
MCV: 84.5 fL (ref 78.0–100.0)
MONO ABS: 1 10*3/uL (ref 0.1–1.0)
MONOS PCT: 9 %
NEUTROS PCT: 60 %
Neutro Abs: 6.1 10*3/uL (ref 1.7–7.7)
Platelets: 156 10*3/uL (ref 150–400)
RBC: 5.22 MIL/uL (ref 4.22–5.81)
RDW: 16.1 % — AB (ref 11.5–15.5)
WBC: 10.3 10*3/uL (ref 4.0–10.5)

## 2017-08-17 LAB — GLUCOSE, CAPILLARY
GLUCOSE-CAPILLARY: 120 mg/dL — AB (ref 65–99)
Glucose-Capillary: 224 mg/dL — ABNORMAL HIGH (ref 65–99)
Glucose-Capillary: 113 mg/dL — ABNORMAL HIGH (ref 65–99)

## 2017-08-17 LAB — BASIC METABOLIC PANEL
Anion gap: 11 (ref 5–15)
BUN: 54 mg/dL — AB (ref 6–20)
CHLORIDE: 98 mmol/L — AB (ref 101–111)
CO2: 29 mmol/L (ref 22–32)
CREATININE: 2.16 mg/dL — AB (ref 0.61–1.24)
Calcium: 8.9 mg/dL (ref 8.9–10.3)
GFR calc Af Amer: 34 mL/min — ABNORMAL LOW (ref >60)
GFR calc non Af Amer: 29 mL/min — ABNORMAL LOW (ref >60)
Glucose, Bld: 115 mg/dL — ABNORMAL HIGH (ref 65–99)
Potassium: 3.5 mmol/L (ref 3.5–5.1)
Sodium: 138 mmol/L (ref 135–145)

## 2017-08-17 MED ORDER — TORSEMIDE 100 MG PO TABS: 50.0000 mg | ORAL_TABLET | Freq: Two times a day (BID) | ORAL | 6 refills | Status: DC

## 2017-08-17 MED ORDER — METOLAZONE 2.5 MG PO TABS: 2.5000 mg | ORAL_TABLET | ORAL | 3 refills | Status: DC

## 2017-08-17 MED ORDER — CEFDINIR 300 MG PO CAPS: 300.0000 mg | ORAL_CAPSULE | Freq: Two times a day (BID) | ORAL | 0 refills | Status: AC

## 2017-08-17 NOTE — Progress Notes (Signed)
Pt is active with [facility] and will continue at discharge. A call was made to [facility] spoke with clinical RN. Will need orders and discharge summary faxed to [facility] [phone] attn:[name].

## 2017-08-17 NOTE — Progress Notes (Signed)
Holly from [facility] called pt will need to transport by [facility] to brothers home, [address] [place], [place] [address].

## 2017-08-17 NOTE — Discharge Summary (Signed)
[name], is a 72 y.o. male  DOB 08-09-1946  MRN [id number].  Admission date:  08/15/2017  Admitting Physician  Amrit Jodie Echevaria, MD  Discharge Date:  08/17/2017   Primary MD  [name], MD  Recommendations for primary care physician for things to follow:   Follow-up with your regular doctor for BMP/kidney test in 1-2 weeks  2)Take Omnicef as prescribed for Proteus UTI, please call back ([facility] [place] [facility] on 08/19/17 and ask for Dr Roxan Hockey to get the final results of your urine culture to see if the antibiotic you are on needs to be changed/adjusted.    Admission Diagnosis  Hypernatremia [E87.0] Asterixis [R27.8] Acute cystitis without hematuria [N30.00]   Discharge Diagnosis  Hypernatremia [E87.0] Asterixis [R27.8] Acute cystitis without hematuria [N30.00]    Principal Problem:   Hypernatremia Active Problems:   Hyperlipidemia   Coronary artery disease involving native coronary artery of native heart without angina pectoris   OSA (obstructive sleep apnea)   PAF (paroxysmal atrial fibrillation) (HCC)   CKD (chronic kidney disease) stage 3, GFR 30-59 ml/min (HCC)   Hypertension   Chronic systolic CHF (congestive heart failure) (HCC)   AKI (acute kidney injury) ([place])   Acute lower UTI   Coarse tremors      Past Medical History:  Diagnosis Date  . Arthritis    "all over" (06/23/2013)  . Atrial fibrillation ([place])    not Candidate for Coumadin d/t rectal bleeding  . Benign prostatic hypertrophy   . Cardiomyopathy, hypertensive ([place])     diastolic dysfunction by 8757 echo  . Coronary artery disease     nonobstructive. 2 vessel. cardiac catheter in March 2005 showing 60% LAD occlusion, 30% circumflex occlusion.  . Degenerative joint disease     Right knee.  . Diabetes type 2, uncontrolled ([place])   . Diabetic peripheral neuropathy ([place])   . Diabetic  ulcer of thigh ([place])    H/O Rt thigh ulcer.  . Exertional shortness of breath    "comes and goes" (06/23/2013)  . GERD (gastroesophageal reflux disease)   . Gout   . Herpes   . Hyperlipidemia   . Hypertension   . Hypotension 09/05/2013  . Incarcerated ventral hernia    H/O. S/P repair ventral hernia repair 07/2005.  Marland Kitchen LBBB (left bundle branch block)   . Legally blind   . Morbid obesity ([place])   . Obstructive sleep apnea    On CPAP. Last sleep study (06/2009) - Severe obstructive sleep apnea/hypopnea syndrome, apnea-hypopnea index 70.5 per hour with non positional events moderately loud snoring and oxygen desaturation to a nadir of 85% on room air.  . Occult blood positive stool     History of in August 2007.  colonoscopy in January 2008 showing normal colon with fair inadequate prep. By Dr. Silvano Rusk.  . 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 Sepsis ([place]) 08/26/2013    Past Surgical History:  Procedure Laterality Date  . AMPUTATION Left 06/22/2013  Procedure: AMPUTATION FOOT;  Surgeon: Newt Minion, MD;  Location: [facility];  Service: Orthopedics;  Laterality: Left;  Left Midfoot Amputation  . CARDIAC CATHETERIZATION  10/22/2003   Dilatation of the LV with preserved LV systolic function. Tortous aorta, but no AAA. Moderate disease with 50-60% area of narrowing in the circumflex after the second OM and 70% mid narrowing in the LAD. No intervention.  Marland Kitchen CARDIOVASCULAR STRESS TEST  01/23/2010   Perfusion defect seen in inferior myocardial region is consistent with diaphragmatic attenuation. Remaining myocardium demonstrates normal myocardial perfusion with no evidence of ischemia or infarct. Stress ECG was non-diagnostic due to LBBB.  Marland Kitchen CATARACT EXTRACTION, BILATERAL Bilateral   . ESOPHAGOGASTRODUODENOSCOPY N/A 10/12/2013   Procedure: ESOPHAGOGASTRODUODENOSCOPY (EGD);  Surgeon: Gwenyth Ober, MD;  Location: [facility];  Service: General;  Laterality: N/A;  bedside  . EYE SURGERY      . 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 HERNIA REPAIR    . INSERT / REPLACE / REMOVE PACEMAKER  November 24, 2003   Secondary to symptomatic bradycardia. DDDR pacemaker. By Dr. Rollene Fare.  Randolm Idol / REPLACE / REMOVE PACEMAKER  November 23, 2012   "old pacemaker died; had new one put in" (2013/07/10)  . Pacemaker lead revision  12/2003    likely microdislodgment  of right ventricular lead  . PEG PLACEMENT N/A 10/12/2013   Procedure: PERCUTANEOUS ENDOSCOPIC GASTROSTOMY (PEG) PLACEMENT;  Surgeon: Gwenyth Ober, MD;  Location: [facility];  Service: General;  Laterality: N/A;  . PERMANENT PACEMAKER GENERATOR CHANGE N/A 11/03/2012   Procedure: PERMANENT PACEMAKER GENERATOR CHANGE;  Surgeon: Sanda Klein, MD;  Location: [facility] CATH [facility];  Service: Cardiovascular;  Laterality: N/A;  . REFRACTIVE SURGERY Bilateral   . TRANSTHORACIC ECHOCARDIOGRAM  01/01/2013   EF 50-55%, ventricular septum-thickness moderately increased  . VENOUS DOPPLER  01/17/2013   No evidence of thrombus or thrombophlebitis. Left GSVs appear enlarged and demonstrate valvular insuffiiciency. Bilateral SSVs appeared patent with no valvular insufficiency seen.  Marland Kitchen VENTRAL HERNIA REPAIR  07/2005    S/P laparotomy, lysis of adhesions, repair of incarcerated ventral hernia with  partial omentectomy.. Performed by Dr. Marlou Starks.       HPI  from the history and physical done on the day of admission:     Chief Complaint: Tremors,Back pain  HPI: Raydel Hosick is a 72 y.o. male with medical history significant of paroxysmal atrial fibrillation, chronic diastolic heart failure, hypertension, hyperlipidemia, diabetes mellitus, legal blindness, chronic kidney disease who presented from skilled nursing facility with complaints of increased tremors, weakness and back pain. Patient was apparently all right about 2-3 days ago.  Since last couple of days he has been increasingly shaky.  He feels very dry and dehydrated.  He was  also complaining of pain on the backside when he ambulates.  Patient was recently discharged from unit on 06/24/17 after management of acute hypoxic respiratory failure secondary to pulmonary edema.  He was intubated at that time.  Patient was then discharged to skilled nursing facility. Patient never had any history of tremors in the past.  Patient is a poor historian.  He is legally blind usually is taken care of by his brother but at present he is on a skilled nursing facility. Patient was found to be severely hypernatremic on presentation.  Urinalysis was also suggestive of UTI but patient denies any history of dysuria.  Patient reported poor oral intake and he does not usually take that much of water.  He looks clinically  dehydrated.  Tongue was dry. Patient was observed to have tremors of the hand.  Patient denies any chest pain, shortness of breath, palpitations, abdominal pain, nausea, vomiting, diarrhea, fever or chills.  ED Course: Was given 1 g of IV ceftriaxone for possible urinary tract infection      Hospital Course:   Brief Narrative: Francina Ames a 72 y.o.malewith medical history significant ofparoxysmal atrial fibrillation, chronic diastolic heart failure, hypertension, hyperlipidemia, diabetes mellitus, legal blindness,chronic kidney disease who presented from skilled nursing facility with complaints of increased tremors, weakness and back pain. Patient was admitted for the management of hypernatremia ,possible UTI and acute kidney injury on CKD. Sodium level has trended down today.  Kidney function is improving.  He looks much better this morning.   1)Hypernatremia : Resolved with hydration and adjustment of diuretics, sodium is down to 138 from 151 on admission, metolazone has been changed to a week from 3 times a week, documented to be reduced to 50 mg twice daily from 100 mg twice daily.  Repeat BMP with PCP within 1-2 weeks, avoid nephrotoxic agents including  NSAIDs.   2)Tremors: Unknown etiology.Resolved.   Head CT did not show any acute intracranial abnormalities.  3)Acute kidney injury on CKD: Improved, creatinine is down to his usual baseline of 2.1 from 2.75 on admission,.Most likely secondary to overdiuresis/volume depletion/poor oral intake.  Please see medication/diurectic adjustments as above number .  Outpatient follow-up with his  nephrology for the management of his CKD advised.  4)Proteus Mirabilis Urinary tract infection:  Medically improved, treated with IV Rocephin from 08/05/2017 to 08/17/2017 inclusive, discharged home on [place].  White count is down to 10.3 from 12.5 on admission, patient is afebrile.  He will call back on 08/19/2017 to get final report of urine culture and sensitivity and see if antibiotics need to be adjusted  5)HFpEF-patient with history of chronic diastolic dysfunction CHF, last known EF of 60-65%, clinically appeared  Dry on admission, patient was hydrated and diuretics have been reduced kidney concerns and hypernatremia.  Patient appears well compensated at this time with no acute exacerbation  6)Paroxysmal A. fib: Stable,ptient has been on metoprolol for rate control and aspirin for stroke prophylaxis, ??  Need for full anticoagulation given his CHADs score, patient would like to discuss this with PCP prior to making any further decisions  7)DM- resume home diabetic regimen including Victoza and Toujeo  8)OSA-CPAP nightly at home  9)Disposition-generalized weakness/debility, physical therapy evaluation appreciated, at this time patient was to go home with home health services including PT   Discharge Condition: stable,   Follow UP-nephrologist for BMP recheck  Consults obtained - Phy therapy  Diet and Activity recommendation:  As advised  Discharge Instructions    Discharge Instructions    (HEART FAILURE PATIENTS) Call MD:  Anytime you have any of the following symptoms: 1) 3 pound weight gain  in 24 hours or 5 pounds in 1 week 2) shortness of breath, with or without a dry hacking cough 3) swelling in the hands, feet or stomach 4) if you have to sleep on extra pillows at night in order to breathe.   Complete by:  As directed    Call MD for:  difficulty breathing, headache or visual disturbances   Complete by:  As directed    Call MD for:  persistant dizziness or light-headedness   Complete by:  As directed    Call MD for:  persistant nausea and vomiting   Complete by:  As directed  Call MD for:  temperature >100.4   Complete by:  As directed    Diet - low sodium heart healthy   Complete by:  As directed    Discharge instructions   Complete by:  As directed    1)Please note adjustments made to your medications including a diuretic medications 2)Follow-up with your regular doctor for BMP/kidney test in 1-2 weeks 3)Avoid ibuprofen/Advil/Aleve/Motrin/Goody Powders/Naproxen/BC powders as these will make you more likely to bleed and can cause stomach ulcers 4)Take Omnicef as prescribed for Proteus UTI, please call back ([place] on 08/19/17 and ask for Dr Roxan Hockey to get the final results of your urine culture to see if the antibiotic you are on needs to be changed/adjusted.   Increase activity slowly   Complete by:  As directed         Discharge Medications     Allergies as of 08/17/2017   No Known Allergies     Medication List    TAKE these medications   acetaminophen 650 MG CR tablet Commonly known as:  TYLENOL Take 1,300 mg by mouth 2 (two) times daily.   albuterol 108 (90 Base) MCG/ACT inhaler Commonly known as:  PROVENTIL HFA;VENTOLIN HFA Inhale 2 puffs into the lungs every 6 (six) hours as needed for wheezing or shortness of breath.   amLODipine 10 MG tablet Commonly known as:  NORVASC Take 10 mg by mouth daily. Reported on 12/09/2015   aspirin 325 MG tablet Take 325 mg by mouth daily.   atorvastatin 40 MG tablet Commonly known  as:  LIPITOR Take 1 tablet (40 mg total) by mouth daily.   BIOFREEZE EX Apply 1 application topically 3 (three) times daily as needed (pain). Reported on 10/27/2015   cefdinir 300 MG capsule Commonly known as:  OMNICEF Take 1 capsule (300 mg total) by mouth 2 (two) times daily for 5 days.   cholecalciferol 1000 units tablet Commonly known as:  VITAMIN D Take 1,000 Units by mouth daily.   citalopram 20 MG tablet Commonly known as:  CELEXA Take 20 mg by mouth daily.   eucerin cream Apply 1 application topically 2 (two) times daily.   Febuxostat 80 MG Tabs Take 80 mg by mouth daily.   gabapentin 300 MG capsule Commonly known as:  NEURONTIN Take 300 mg by mouth 2 (two) times daily.   hydrALAZINE 50 MG tablet Commonly known as:  APRESOLINE Take 1 tablet (50 mg total) 3 (three) times daily by mouth.   lidocaine 5 % Commonly known as:  LIDODERM Place 1 patch onto the skin daily as needed (back pain). Remove & Discard patch within 12 hours or as directed by MD   Melatonin 3 MG Caps Take 1 capsule by mouth at bedtime as needed (sleep).   methylcellulose 1 % ophthalmic solution Commonly known as:  ARTIFICIAL TEARS Place 1 drop into both eyes 2 (two) times daily as needed (dry eyes).   metolazone 2.5 MG tablet Commonly known as:  ZAROXOLYN Take 1 tablet (2.5 mg total) by mouth 2 (two) times a week. Monday and Fridays Start taking on:  08/18/2017 What changed:    when to take this  additional instructions   metoprolol succinate 50 MG 24 hr tablet Commonly known as:  TOPROL-XL Take 1 tablet (50 mg total) daily by mouth. Take with or immediately following a meal.   polyethylene glycol packet Commonly known as:  MIRALAX / GLYCOLAX Take 17 g by mouth daily as needed for  moderate constipation.   potassium chloride SA 20 MEQ tablet Commonly known as:  K-DUR,KLOR-CON Take 60 mEq (3 Tabs) in the AM and 40 mEq (2 Tabs) in the PM.   ranitidine 150 MG tablet Commonly known as:   ZANTAC Take 150 mg by mouth daily.   sennosides-docusate sodium 8.6-50 MG tablet Commonly known as:  SENOKOT-S Take 1 tablet by mouth 2 (two) times daily.   torsemide 100 MG tablet Commonly known as:  DEMADEX Take 0.5 tablets (50 mg total) by mouth 2 (two) times daily. What changed:  how much to take   TOUJEO SOLOSTAR 300 UNIT/ML Sopn Generic drug:  Insulin Glargine Inject 80 Units into the skin 2 (two) times daily.   VICTOZA 18 MG/3ML Sopn Generic drug:  liraglutide Inject 1.8 mg into the skin daily.       Major procedures and Radiology Reports - PLEASE review detailed and final reports for all details, in brief -   Dg Chest 2 View  Result Date: 08/15/2017 CLINICAL DATA:  Chronic respiratory failure. EXAM: CHEST  2 VIEW COMPARISON:  Chest x-ray dated June 21, 2017. FINDINGS: Unchanged right chest wall pacer device. Stable moderate cardiomegaly. Pulmonary vascular congestion and interstitial edema appear improved when compared to prior study. No focal consolidation, pleural effusion, or pneumothorax. No acute osseous abnormality. IMPRESSION: Stable moderate cardiomegaly with improved vascular congestion and interstitial edema when compared to prior study. Electronically Signed   By: Titus Dubin M.D.   On: 08/15/2017 17:48   Ct Head Wo Contrast  Result Date: 08/15/2017 CLINICAL DATA:  Posterior headache for the past 2 days. Intermittent tremors. EXAM: CT HEAD WITHOUT CONTRAST TECHNIQUE: Contiguous axial images were obtained from the base of the skull through the vertex without intravenous contrast. COMPARISON:  CT head dated June 07, 2017. FINDINGS: Brain: No evidence of acute infarction, hemorrhage, hydrocephalus, extra-axial collection or mass lesion/mass effect. 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 Unchanged  enlarged pituitary gland, measuring 1 cm. Vascular: Calcified atherosclerosis at the skullbase. No hyperdense vessel. Skull: No fracture or focal lesion. Sinuses/Orbits: Mild mucosal thickening of the ethmoid air cells and right sphenoid sinus. The mastoid air cells are clear. No acute orbital abnormality. Unchanged bilateral proptosis. Other: None. IMPRESSION: 1. No acute intracranial abnormality. Stable atrophy and chronic microvascular ischemic white matter disease. 2. Unchanged enlarged pituitary gland, measuring 1 cm. No further imaging evaluation or follow-up is necessary. Consider endocrine function tests and correlate for history of pituitary hypersecretion. This follows ACR consensus guidelines: Management of Incidental Pituitary Findings on CT, MRI and F18-FDG PET: A White Paper of the ACR Incidental Findings Committee. J Am Coll Radiol 2018; 15: 423-53. Electronically Signed   By: Titus Dubin M.D.   On: 08/15/2017 18:19    Micro Results    Recent Results (from the past 240 hour(s))  Urine culture     Status: Abnormal (Preliminary result)   Collection Time: 08/15/17  3:27 PM  Result Value Ref Range Status   Specimen Description URINE, CLEAN CATCH  Final   Special Requests NONE  Final   Culture 70,000 COLONIES/mL PROTEUS MIRABILIS (A)  Final   Report Status PENDING  Incomplete       Today   Subjective    [name] today has no f/c, no n/v/d, no flank pain, no cp, no sob  Patient has been seen and examined prior to discharge   Objective   Blood pressure (!) 155/81, pulse 65, temperature (!) 97.5 F (36.4 C), temperature source Axillary, resp. rate 18, height 5' 11.5" (1.816 m), weight 133.7 kg (294 lb 12.1 oz), SpO2 98 %.   Intake/Output Summary (Last 24 hours) at 08/17/2017 1550 Last data filed at 08/17/2017 1300 Gross per 24 hour  Intake 2405 ml  Output 1100 ml  Net 1305 ml    Exam Gen:- Awake  In no apparent distress  HEENT:- [place].AT,   Neck-Supple  Neck,No JVD,  Lungs- mostly clear  CV- S1, S2 normal Abd-  +ve B.Sounds, Abd Soft, No tenderness,   no CVA area tenderness Extremity/Skin:- Intact peripheral pulses   Psych-affect is appropriate   Data Review   CBC w Diff:  Lab Results  Component Value Date   WBC 10.3 08/17/2017   HGB 13.6 08/17/2017   HCT 44.1 08/17/2017   PLT 156 08/17/2017   LYMPHOPCT 30 08/17/2017   MONOPCT 9 08/17/2017   EOSPCT 1 08/17/2017   BASOPCT 0 08/17/2017    CMP:  Lab Results  Component Value Date   NA 138 08/17/2017   NA 141 01/05/2017   K 3.5 08/17/2017   CL 98 (L) 08/17/2017   CO2 29 08/17/2017   BUN 54 (H) 08/17/2017   BUN 48 (H) 01/05/2017   CREATININE 2.16 (H) 08/17/2017   CREATININE 2.28 (H) 04/23/2013   PROT 6.7 08/16/2017   PROT 7.4 01/05/2017   ALBUMIN 3.2 (L) 08/16/2017   ALBUMIN 4.0 01/05/2017   BILITOT 1.0 08/16/2017   BILITOT 0.6 01/05/2017   ALKPHOS 78 08/16/2017   AST 26 08/16/2017   ALT 26 08/16/2017  .   Total Discharge time is about 33 minutes  Roxan Hockey M.D on 08/17/2017 at 3:50 PM  [facility]   Office  [phone]  Voice Recognition Viviann Spare dictation system was used to create this note, attempts have been made to correct errors. Please contact the author with questions and/or clarifications.

## 2017-08-17 NOTE — Progress Notes (Signed)
[facility] was called.  

## 2017-08-17 NOTE — Progress Notes (Signed)
Nutrition Brief Note  Patient identified on the Malnutrition Screening Tool (MST) Report  Wt Readings from Last 15 Encounters:  08/15/17 294 lb 12.1 oz (133.7 kg)  07/27/17 (!) 304 lb 3.2 oz (138 kg)  06/24/17 (!) 313 lb 0.9 oz (142 kg)  06/07/17 (!) 340 lb (154.2 kg)  03/23/17 (!) 339 lb (153.8 kg)  02/09/17 (!) 333 lb 9.6 oz (151.3 kg)  01/05/17 (!) 329 lb (149.2 kg)  05/18/16 (!) 353 lb (160.1 kg)  02/04/16 (!) 354 lb (160.6 kg)  12/09/15 (!) 348 lb (157.9 kg)  10/27/15 (!) 345 lb 12 oz (156.8 kg)  10/08/15 (!) 354 lb (160.6 kg)  10/07/15 (!) 354 lb (160.6 kg)  09/17/15 (!) 351 lb (159.2 kg)  09/03/15 (!) 351 lb 12.8 oz (159.6 kg)    Body mass index is 40.54 kg/m. Patient meets criteria for morbid obesity based on current BMI. Pt unsure of weight changes however reports a UBW ~300 lbs. Nutrition focused physical exam completed. No muscle or subcutaneous fat depletions found.   Current diet order is heart healthy, patient is consuming approximately 80-100% of meals at this time. Pt reports a good appetite stating he "has no trouble eating." Pt is vision impaired and asked for his tray at visit.   Labs and medications reviewed.   No nutrition interventions warranted at this time. If nutrition issues arise, please consult RD.   Parks Ranger, MS, RDN, LDN 08/17/2017 1:16 PM

## 2017-08-17 NOTE — Care Management Note (Signed)
Case Management Note  Patient Details  Name: [name] MRN: [id number] Date of Birth: [birth year]  Subjective/Objective:                    Action/Plan:Plan to discharge with [facility] for PT, RN,    Expected Discharge Date:  (unknown)               Expected Discharge Plan:  Home/Self Care([facility])  In-House Referral:  Clinical Social Work  Discharge planning Services  CM Consult  Post Acute Care Choice:    Choice offered to:  Patient  DME Arranged:    DME Agency:     HH Arranged:  ([facility]) [place] Agency:  ([facility])  Status of Service:     If discussed at [place] [facility] [employer], dates discussed:    Additional CommentsPurcell Mouton, RN 08/17/2017, 2:06 PM

## 2017-08-17 NOTE — Evaluation (Signed)
Physical Therapy Evaluation Patient Details Name: [name] MRN: [id number] DOB: June 05, 1946 Today's Date: 08/17/2017   History of Present Illness  72 y.o. male with complaints of increased tremors, weakness and back pain and admitted for hypernatremia. Pt also with AKI on CKD. PMH - blindness, lt transmetatarsal amputation, ckd, morbid obesity, dm, htn, PPM, cad,   Clinical Impression  Pt admitted with above diagnosis. Pt currently with functional limitations due to the deficits listed below (see PT Problem List).  Pt will benefit from skilled PT to increase their independence and safety with mobility to allow discharge to the venue listed below.  Pt assisted with ambulating in hallway and then set up in recliner for breakfast.  Pt requiring occasional min assist however mostly for manual cues due to visual impairment.  Pt feels he would function better in his brother's home - more familiar environment (agreed).  Pt reports receiving home health services and anticipates returning to this.     Follow Up Recommendations Home health PT;Supervision - Intermittent    Equipment Recommendations  None recommended by PT    Recommendations for Other Services       Precautions / Restrictions Precautions Precautions: Fall Precaution Comments: legally blind Restrictions Weight Bearing Restrictions: No      Mobility  Bed Mobility Overal bed mobility: Needs Assistance Bed Mobility: Supine to Sit     Supine to sit: Supervision;HOB elevated     General bed mobility comments: verbal and tactile cues for technique - due to blindness  Transfers Overall transfer level: Needs assistance Equipment used: Rolling walker (2 wheeled) Transfers: Sit to/from Stand Sit to Stand: Min assist         General transfer comment: slight assist to rise, wide BOS, verbal cues for technique  Ambulation/Gait Ambulation/Gait assistance: Min assist Ambulation Distance (Feet): 120 Feet Assistive  device: Rolling walker (2 wheeled) Gait Pattern/deviations: Step-through pattern;Decreased stride length;Drifts right/left     General Gait Details: drifting left and right due to blindness, able to attempt to correct with verbal cues, also required occasional assist with RW negotiation, no physical assist of pt required; Spo2 94% on room air upon return to room  Stairs            Wheelchair Mobility    Modified Rankin (Stroke Patients Only)       Balance                                             Pertinent Vitals/Pain Pain Assessment: No/denies pain    Home Living Family/patient expects to be discharged to:: Private residence Living Arrangements: (brother) Available Help at Discharge: Family;Personal care attendant Type of Home: House Home Access: Ramped entrance     Home Layout: One level Home Equipment: Environmental consultant - 4 wheels;Bedside commode;Shower seat Additional Comments: Has aides multiple days/times during the week. Family also checks on pt    Prior Function Level of Independence: Needs assistance   Gait / Transfers Assistance Needed: Amb household modified independent with rollator.     Comments: Pt is blind     Hand Dominance        Extremity/Trunk Assessment        Lower Extremity Assessment Lower Extremity Assessment: Generalized weakness       Communication   Communication: No difficulties  Cognition Arousal/Alertness: Awake/alert Behavior During Therapy: WFL for tasks assessed/performed Overall Cognitive Status: Within  Functional Limits for tasks assessed                                        General Comments      Exercises     Assessment/Plan    PT Assessment Patient needs continued PT services  PT Problem List Decreased strength;Decreased mobility;Decreased activity tolerance;Decreased knowledge of use of DME;Decreased balance       PT Treatment Interventions Therapeutic exercise;Gait  training;Patient/family education;Therapeutic activities;DME instruction;Balance training;Functional mobility training    PT Goals (Current goals can be found in the Care Plan section)  Acute Rehab PT Goals PT Goal Formulation: With patient Time For Goal Achievement: 08/31/17 Potential to Achieve Goals: Good    Frequency Min 3X/week   Barriers to discharge        Co-evaluation               AM-PAC PT "6 Clicks" Daily Activity  Outcome Measure Difficulty turning over in bed (including adjusting bedclothes, sheets and blankets)?: None Difficulty moving from lying on back to sitting on the side of the bed? : A Little Difficulty sitting down on and standing up from a chair with arms (e.g., wheelchair, bedside commode, etc,.)?: Unable Help needed moving to and from a bed to chair (including a wheelchair)?: A Little Help needed walking in hospital room?: A Little Help needed climbing 3-5 steps with a railing? : A Lot 6 Click Score: 16    End of Session Equipment Utilized During Treatment: Gait belt Activity Tolerance: Patient tolerated treatment well Patient left: in chair;with call bell/phone within reach(eating breakfast, aware of call bell and phone placement) Nurse Communication: Mobility status PT Visit Diagnosis: Other abnormalities of gait and mobility (R26.89)    Time: 2952-8413 PT Time Calculation (min) (ACUTE ONLY): 19 min   Charges:   PT Evaluation $PT Eval Moderate Complexity: 1 Mod     PT G CodesCarmelia Bake, PT, DPT 08/17/2017 Pager: [phone]  [name] 08/17/2017, 12:20 PM

## 2017-08-17 NOTE — Discharge Instructions (Signed)
1)Please note adjustments made to your medications including a diuretic medications 2)Follow-up with your regular doctor for BMP/kidney test in 1-2 weeks 3)Avoid ibuprofen/Advil/Aleve/Motrin/Goody Powders/Naproxen/BC powders as these will make you more likely to bleed and can cause stomach ulcers 4)Take Omnicef as prescribed for Proteus UTI, please call back ([facility] on 08/19/17 and ask for Dr Roxan Hockey to get the final results of your urine culture to see if the antibiotic you are on needs to be changed/adjusted.

## 2017-08-17 NOTE — Progress Notes (Signed)
Went over all discharge information with patient and pt daughter.  Went over all medications and answered all questions.  VSS.  PT discharged via ptar.

## 2017-08-18 LAB — URINE CULTURE: Culture: 70000 — AB

## 2017-09-01 IMAGING — CR DG CHEST 2V
3 series · 3 of 3 positions shown · non-contrast
Comparison: 11/15/2013.

CLINICAL DATA: Cough and congestion.

EXAM:
CHEST  2 VIEW

[w chest lat]
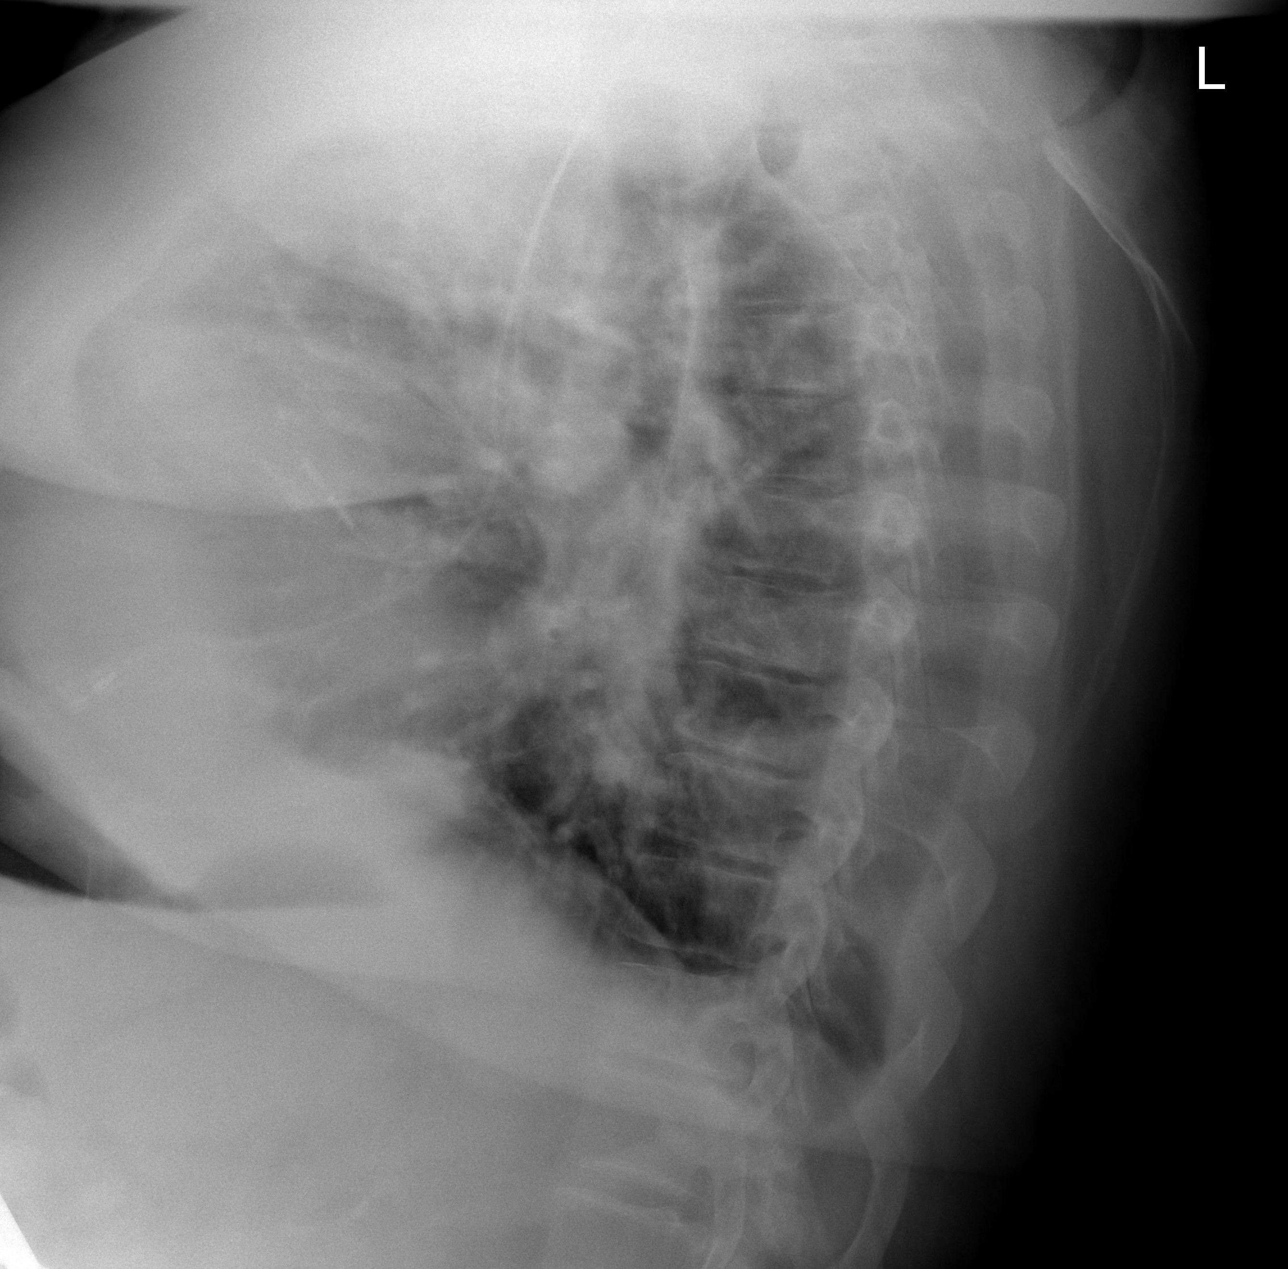

[w chest lat *]
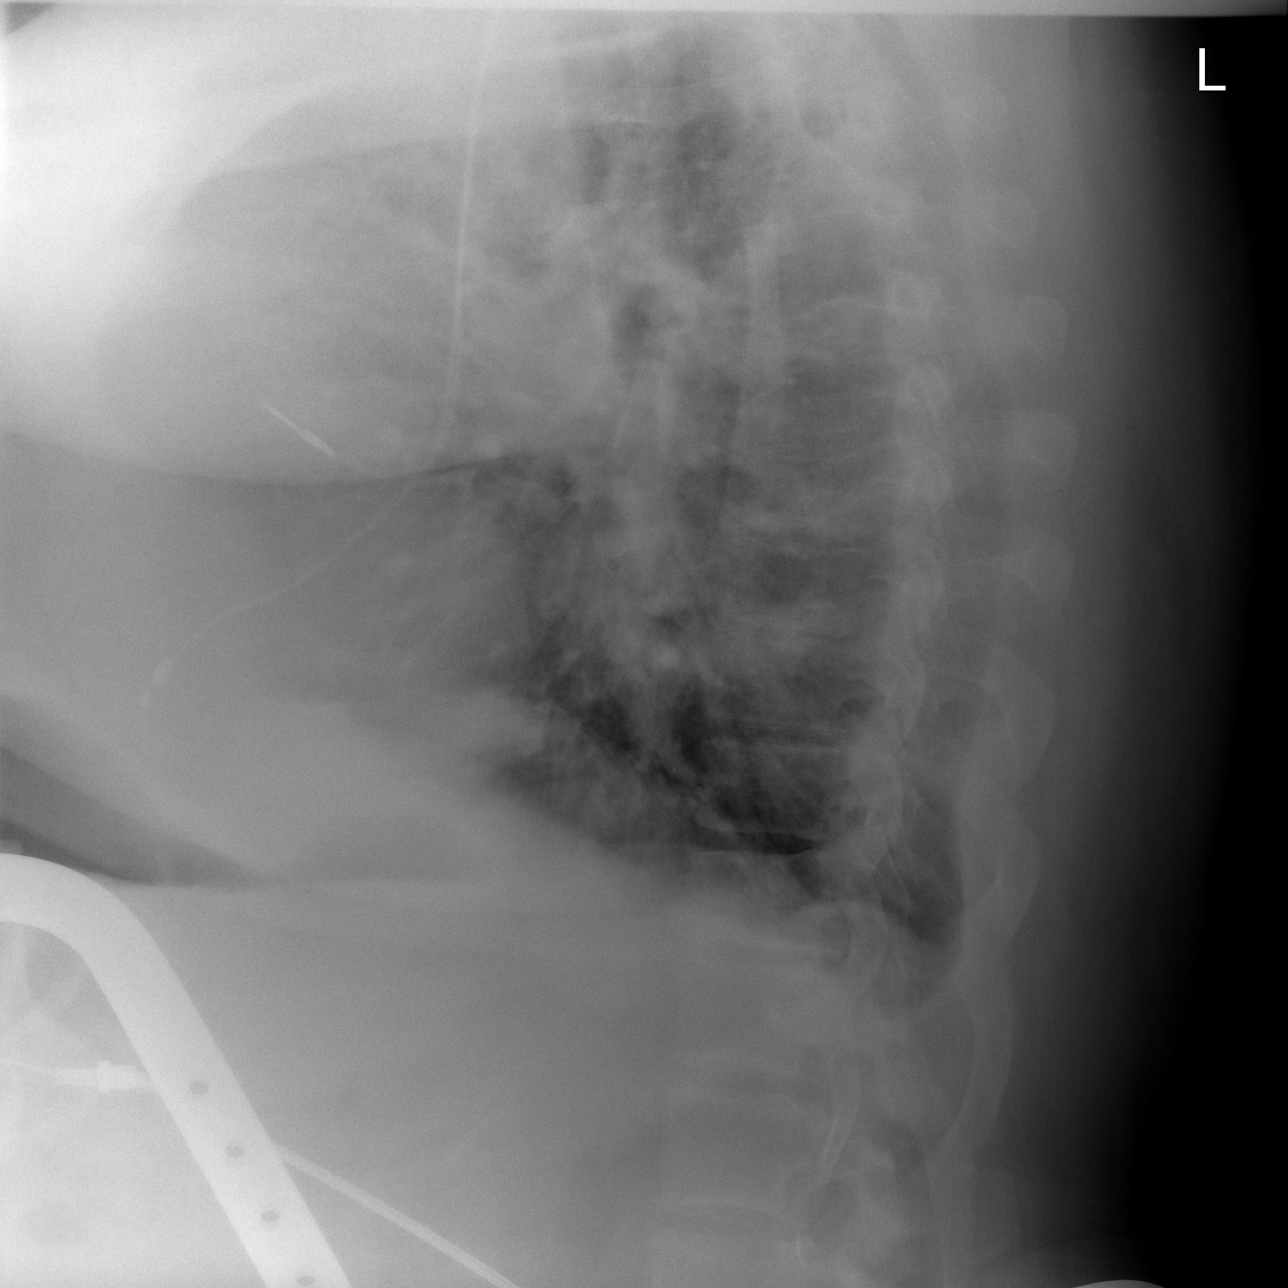

[w chest ap *]
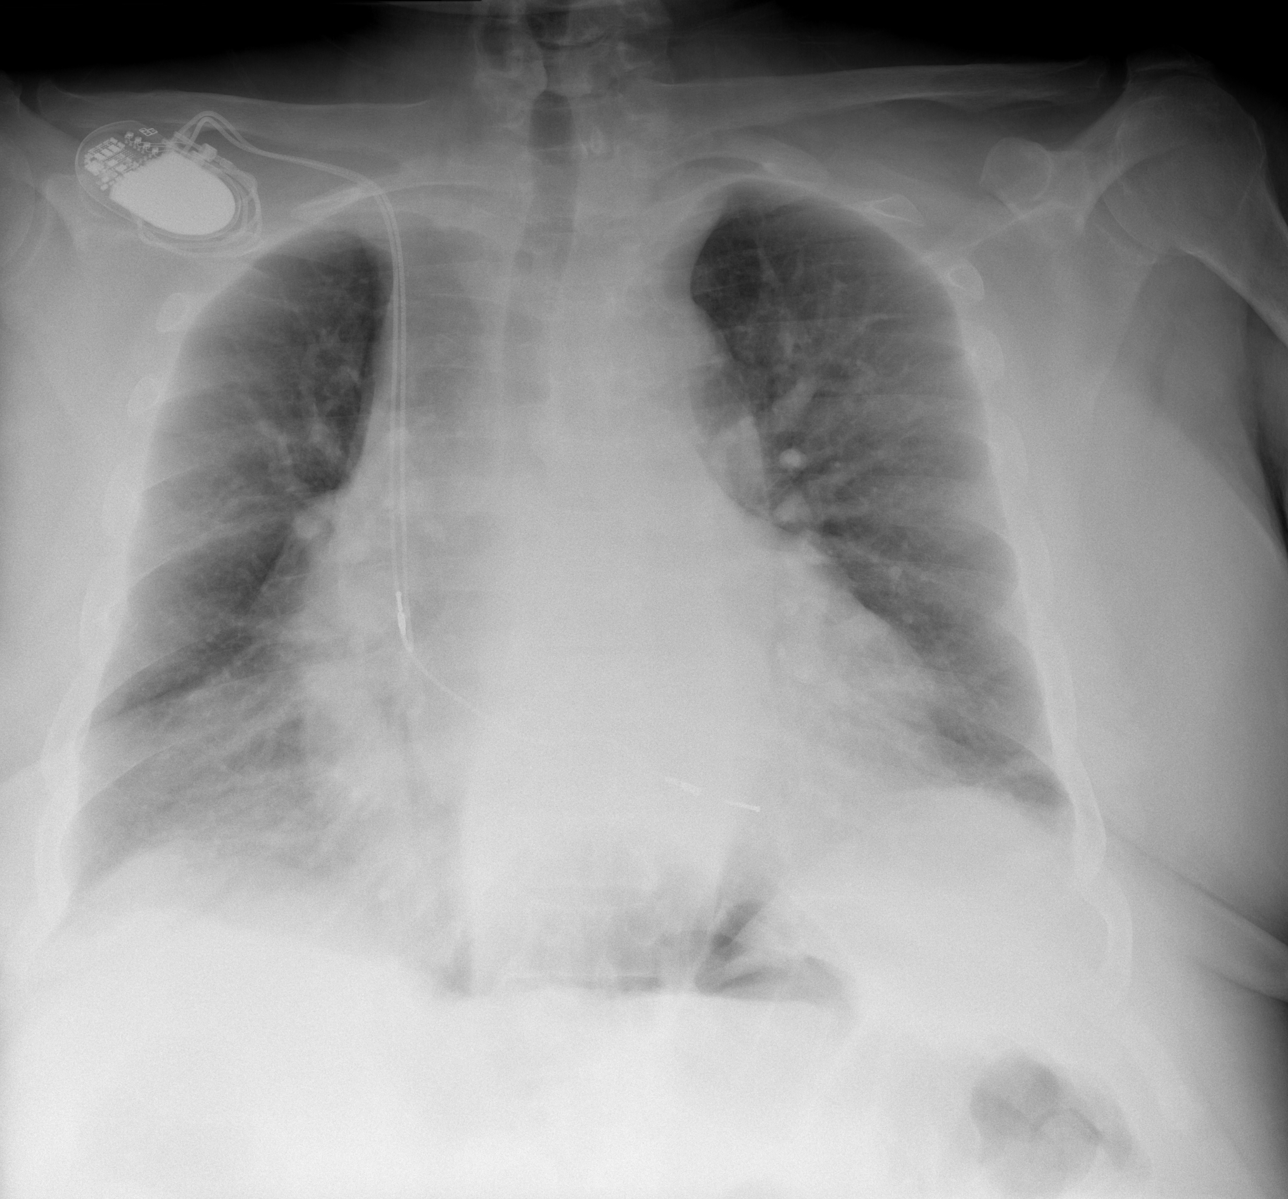

[3 of 3 positions shown; findings below may reference images not displayed]

FINDINGS: Cardiac pacer with lead tips in right atrium right ventricle.
Cardiomegaly with mild pulmonary vascular prominence. No evidence of
overt pulmonary edema. No pleural effusion or pneumothorax. No acute
bony abnormality.
IMPRESSION: 1. Cardiac pacer in stable position.
2. Cardiomegaly with mild pulmonary venous congestion. No evidence
of overt congestive heart failure.

## 2017-09-07 ENCOUNTER — Encounter (HOSPITAL_COMMUNITY): Payer: Medicare (Managed Care)

## 2017-09-13 ENCOUNTER — Ambulatory Visit (HOSPITAL_COMMUNITY)
Admission: RE | Admit: 2017-09-13 | Discharge: 2017-09-13 | Disposition: A | Discharge status: Home / Self Care | Payer: Medicare (Managed Care) | Source: Ambulatory Visit | Attending: Cardiology | Admitting: Cardiology

## 2017-09-13 VITALS — BP 140/78 | HR 80 | Wt 323.0 lb

## 2017-09-13 DIAGNOSIS — Z6841 Body Mass Index (BMI) 40.0 and over, adult: Secondary | ICD-10-CM | POA: Diagnosis not present

## 2017-09-13 DIAGNOSIS — I2510 Atherosclerotic heart disease of native coronary artery without angina pectoris: Secondary | ICD-10-CM | POA: Diagnosis not present

## 2017-09-13 DIAGNOSIS — I480 Paroxysmal atrial fibrillation: Secondary | ICD-10-CM | POA: Insufficient documentation

## 2017-09-13 DIAGNOSIS — I350 Nonrheumatic aortic (valve) stenosis: Secondary | ICD-10-CM | POA: Insufficient documentation

## 2017-09-13 DIAGNOSIS — I712 Thoracic aortic aneurysm, without rupture: Secondary | ICD-10-CM | POA: Insufficient documentation

## 2017-09-13 DIAGNOSIS — Z87891 Personal history of nicotine dependence: Secondary | ICD-10-CM | POA: Insufficient documentation

## 2017-09-13 DIAGNOSIS — Z950 Presence of cardiac pacemaker: Secondary | ICD-10-CM | POA: Diagnosis not present

## 2017-09-13 DIAGNOSIS — H3530 Unspecified macular degeneration: Secondary | ICD-10-CM | POA: Insufficient documentation

## 2017-09-13 DIAGNOSIS — Z8249 Family history of ischemic heart disease and other diseases of the circulatory system: Secondary | ICD-10-CM | POA: Diagnosis not present

## 2017-09-13 DIAGNOSIS — G4733 Obstructive sleep apnea (adult) (pediatric): Secondary | ICD-10-CM | POA: Insufficient documentation

## 2017-09-13 DIAGNOSIS — E1142 Type 2 diabetes mellitus with diabetic polyneuropathy: Secondary | ICD-10-CM | POA: Insufficient documentation

## 2017-09-13 DIAGNOSIS — Z89432 Acquired absence of left foot: Secondary | ICD-10-CM | POA: Diagnosis not present

## 2017-09-13 DIAGNOSIS — N183 Chronic kidney disease, stage 3 (moderate): Secondary | ICD-10-CM | POA: Insufficient documentation

## 2017-09-13 DIAGNOSIS — Z7982 Long term (current) use of aspirin: Secondary | ICD-10-CM | POA: Insufficient documentation

## 2017-09-13 DIAGNOSIS — Z8261 Family history of arthritis: Secondary | ICD-10-CM | POA: Insufficient documentation

## 2017-09-13 DIAGNOSIS — H548 Legal blindness, as defined in USA: Secondary | ICD-10-CM | POA: Diagnosis not present

## 2017-09-13 DIAGNOSIS — I5032 Chronic diastolic (congestive) heart failure: Secondary | ICD-10-CM | POA: Diagnosis not present

## 2017-09-13 DIAGNOSIS — I130 Hypertensive heart and chronic kidney disease with heart failure and stage 1 through stage 4 chronic kidney disease, or unspecified chronic kidney disease: Secondary | ICD-10-CM | POA: Insufficient documentation

## 2017-09-13 DIAGNOSIS — I10 Essential (primary) hypertension: Secondary | ICD-10-CM

## 2017-09-13 DIAGNOSIS — Z794 Long term (current) use of insulin: Secondary | ICD-10-CM | POA: Insufficient documentation

## 2017-09-13 DIAGNOSIS — Z79899 Other long term (current) drug therapy: Secondary | ICD-10-CM | POA: Diagnosis not present

## 2017-09-13 DIAGNOSIS — E1122 Type 2 diabetes mellitus with diabetic chronic kidney disease: Secondary | ICD-10-CM | POA: Diagnosis not present

## 2017-09-13 DIAGNOSIS — R001 Bradycardia, unspecified: Secondary | ICD-10-CM | POA: Insufficient documentation

## 2017-09-13 DIAGNOSIS — Z825 Family history of asthma and other chronic lower respiratory diseases: Secondary | ICD-10-CM | POA: Insufficient documentation

## 2017-09-13 MED ORDER — METOLAZONE 2.5 MG PO TABS: ORAL_TABLET | ORAL | 11 refills | Status: DC

## 2017-09-13 NOTE — Patient Instructions (Signed)
INCREASE Metolazone to 2.5 mg tablet once on Monday, Thursday, and Saturday mornings.  Follow up 4 weeks with [name] NP-C.  Take all medication as prescribed the day of your appointment. Bring all medications with you to your appointment.  Do the following things EVERYDAY: 1) Weigh yourself in the morning before breakfast. Write it down and keep it in a log. 2) Take your medicines as prescribed 3) Eat low salt foods-Limit salt (sodium) to 2000 mg per day.  4) Stay as active as you can everyday 5) Limit all fluids for the day to less than 2 liters

## 2017-09-13 NOTE — Progress Notes (Signed)
Patient ID: [name], male   DOB: 1945-11-22, 72 y.o.   MRN: [id number]   PCP: [name] Cardiology: Dr. Sallyanne Kuster HF Cardiology: Dr. Aundra Dubin Nephrologist: Dr. Merlene Morse [name] is a 72 y.o. 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   He was admitted most recently in 11/18 with acute respiratory failure => intubated.  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  He was treated for acute on chronic diastolic CHF with IV diuresis.  He has remained in NSR.  He returns for HF follow up. He has now been discharged home. He has an Engineer, production daily. Followed closely by [facility]. Mild dyspnea with exertion. Denies PND/Orthopnea. Appetite ok. Weight at home 320-325. Taking all medications.   Last echo in 10/18 showed EF 60-65% with moderate AS.   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 Chronic diastolic CHF with RV dysfunction: Echo (2/15) with EF 60%, mild LVH, mild aortic stenosis with mean gradient 18 mmHg, RV mildly dilated with mildly decreased systolic function.  - Echo (10/18): EF 60-65%, moderate aortic stenosis.  2. Aortic stenosis: Moderate on 10/18 echo.   3. Type II diabetes 4. 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  5. OSA: Uses CPAP.  6. Morbid obesity.  7. HTN 8. CAD: Nonobstructive.  [place] 2005 with 60% LAD stenosis. 9. 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  10. Atrial fibrillation: Paroxysmal.  None recently.  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  11. CKD stage III 12. Macular degeneration: Legally blind.  13. 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  14. 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 15. S/p left foot amputation 16.  Diabetic peripheral neuropathy.   Social History   Socioeconomic History  . Marital status: Single    Spouse name: Not on file  . Number of children: Y  . Years of education: Not on file  . Highest education level: Not on file  Social Needs  . Financial resource strain: Not on file  . Food insecurity - worry: Not on file  . Food insecurity - inability: Not on file  . Transportation needs - medical: Not on file  . Transportation needs - non-medical: Not on file  Occupational History  . Occupation: disabled  Tobacco Use  . Smoking status: Former Smoker    Packs/day: 0.00    Years: 0.00    Pack years: 0.00    Types: Cigarettes    Last attempt to quit: 08/17/2003    Years since quitting: 14.0  . Smokeless tobacco: Never Used  . Tobacco comment: started at age 15.  only smoked on occ- very rarely.0  Substance and Sexual Activity  . Alcohol use: Yes    Comment: 06/23/2013 "drank some; never had a problem w/it"  . Drug use: No  . Sexual activity: No  Other Topics Concern  . Not on file  Social History Narrative  . Not on file    Family History  Problem Relation Age of Onset  . Coronary artery disease Sister   . Heart disease Sister   . Allergies Sister   . Asthma Brother   . Rheum arthritis Brother    Review of systems complete and found to be negative unless  listed in HPI.    Current Outpatient Medications  Medication Sig Dispense Refill  . acetaminophen (TYLENOL) 650 MG CR tablet Take 1,300 mg by mouth 2 (two) times daily.     Marland Kitchen albuterol (PROVENTIL HFA;VENTOLIN HFA) 108 (90 Base) MCG/ACT inhaler Inhale 2 puffs into the lungs every 6 (six) hours as needed for wheezing or shortness of breath.    Marland Kitchen amLODipine (NORVASC) 10 MG tablet Take 10 mg by mouth daily. Reported on 12/09/2015    . aspirin 325 MG tablet Take 325 mg by mouth daily.    Marland Kitchen atorvastatin (LIPITOR) 40 MG tablet Take 1 tablet (40 mg total) by mouth daily. 90 tablet 3  . cholecalciferol (VITAMIN D) 1000 units  tablet Take 1,000 Units by mouth daily.    . citalopram (CELEXA) 20 MG tablet Take 20 mg by mouth daily.    Marland Kitchen gabapentin (NEURONTIN) 300 MG capsule Take 300 mg by mouth 2 (two) times daily.    . hydrALAZINE (APRESOLINE) 50 MG tablet Take 1 tablet (50 mg total) 3 (three) times daily by mouth. 90 tablet 0  . Insulin Glargine (TOUJEO SOLOSTAR) 300 UNIT/ML SOPN Inject 80 Units into the skin 2 (two) times daily.     Marland Kitchen lidocaine (LIDODERM) 5 % Place 1 patch onto the skin daily as needed (back pain). Remove & Discard patch within 12 hours or as directed by MD    . Liraglutide (VICTOZA) 18 MG/3ML SOPN Inject 1.8 mg into the skin daily.    . Melatonin 3 MG CAPS Take 1 capsule by mouth at bedtime as needed (sleep).     . Menthol, Topical Analgesic, (BIOFREEZE EX) Apply 1 application topically 3 (three) times daily as needed (pain). Reported on 10/27/2015    . methylcellulose (ARTIFICIAL TEARS) 1 % ophthalmic solution Place 1 drop into both eyes 2 (two) times daily as needed (dry eyes).     . metolazone (ZAROXOLYN) 2.5 MG tablet Take 1 tablet on Monday, Thursday, and Saturday mornings 20 tablet 11  . metoprolol succinate (TOPROL-XL) 50 MG 24 hr tablet Take 1 tablet (50 mg total) daily by mouth. Take with or immediately following a meal. 30 tablet 0  . polyethylene glycol (MIRALAX / GLYCOLAX) packet Take 17 g by mouth daily as needed for moderate constipation.    . potassium chloride SA (K-DUR,KLOR-CON) 20 MEQ tablet Take 60 mEq (3 Tabs) in the AM and 40 mEq (2 Tabs) in the PM. 150 tablet 3  . ranitidine (ZANTAC) 150 MG tablet Take 150 mg by mouth daily.     . sennosides-docusate sodium (SENOKOT-S) 8.6-50 MG tablet Take 1 tablet by mouth 2 (two) times daily.     . Skin Protectants, Misc. (EUCERIN) cream Apply 1 application topically 2 (two) times daily.    Marland Kitchen torsemide (DEMADEX) 100 MG tablet Take 100 mg by mouth 2 (two) times daily.     No current facility-administered medications for this encounter.     Facility-Administered Medications Ordered in Other Encounters  Medication Dose Route Frequency Provider Last Rate Last Dose  . chlorhexidine gluconate (MEDLINE KIT) (PERIDEX) 0.12 % solution 15 mL  15 mL Mouth Rinse BID Chesley Mires, MD      . MEDLINE mouth rinse  15 mL Mouth Rinse QID Sood, Vineet, MD       BP 140/78   Pulse 80   Wt (!) 323 lb (146.5 kg)   SpO2 91%   BMI 44.42 kg/m    Wt Readings from Last 3 Encounters:  09/13/17 (!) 323 lb (146.5 kg)  08/15/17 294 lb 12.1 oz (133.7 kg)  07/27/17 (!) 304 lb 3.2 oz (138 kg)    General:  Walked slowly in the clinic with walker. No resp difficulty HEENT: normal Neck: supple. JVP to jaw. Carotids 2+ bilat; no bruits. No lymphadenopathy or thryomegaly appreciated. Cor: PMI nondisplaced. Regular rate & rhythm. No rubs, gallops. 3/6 murmur  Lungs: clear Abdomen: soft, nontender, distended. No hepatosplenomegaly. No bruits or masses. Good bowel sounds. Extremities: no cyanosis, clubbing, rash, edema Neuro: alert & orientedx3, cranial nerves grossly intact. moves all 4 extremities w/o difficulty. Affect pleasant    Assessment/Plan: 1. Chronic diastolic CHF with prominent RV failure: Echo in 10/18 with EF 60-65%.   NYHA III. Weight up 20 pounds since last discharge.  Continue torsemide 100 mg twice a day. Increase metolazone to 3 times week.  2. Aortic stenosis: Echo in 10/18 with moderate AS.   3. Atrial fibrillation: Paroxysmal.  He is in NSR today.  He is not anticoagulated (takes ASA) due to prior intracerebral hemorrhage.  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  4. CAD: Nonobstructive.   No S/S ischemia.   - Continue ASA and statin.   5. Bradycardia s/p Medtronic PPM  6. CKD: Stage III BMET in 7 days.    7. OSA: Continue CPAP qhs. 8. Ascending aorta aneurysm: 4.0 cm on 11/18 CT chest. Repeat imaging 06/2018 .   Follow up in 3 months .  Darrick Grinder, NP  09/13/2017

## 2017-10-11 ENCOUNTER — Ambulatory Visit (HOSPITAL_COMMUNITY)
Admission: RE | Admit: 2017-10-11 | Discharge: 2017-10-11 | Disposition: A | Discharge status: Home / Self Care | Payer: Medicare (Managed Care) | Source: Ambulatory Visit | Attending: Cardiology | Admitting: Cardiology

## 2017-10-11 ENCOUNTER — Telehealth (HOSPITAL_COMMUNITY): Payer: Self-pay | Admitting: Surgery

## 2017-10-11 VITALS — BP 138/70 | Wt 326.4 lb

## 2017-10-11 DIAGNOSIS — E1122 Type 2 diabetes mellitus with diabetic chronic kidney disease: Secondary | ICD-10-CM | POA: Diagnosis not present

## 2017-10-11 DIAGNOSIS — Z7982 Long term (current) use of aspirin: Secondary | ICD-10-CM | POA: Insufficient documentation

## 2017-10-11 DIAGNOSIS — I10 Essential (primary) hypertension: Secondary | ICD-10-CM

## 2017-10-11 DIAGNOSIS — I5032 Chronic diastolic (congestive) heart failure: Secondary | ICD-10-CM | POA: Insufficient documentation

## 2017-10-11 DIAGNOSIS — I130 Hypertensive heart and chronic kidney disease with heart failure and stage 1 through stage 4 chronic kidney disease, or unspecified chronic kidney disease: Secondary | ICD-10-CM | POA: Diagnosis present

## 2017-10-11 DIAGNOSIS — I2510 Atherosclerotic heart disease of native coronary artery without angina pectoris: Secondary | ICD-10-CM | POA: Diagnosis not present

## 2017-10-11 DIAGNOSIS — Z87891 Personal history of nicotine dependence: Secondary | ICD-10-CM | POA: Diagnosis not present

## 2017-10-11 DIAGNOSIS — G4733 Obstructive sleep apnea (adult) (pediatric): Secondary | ICD-10-CM | POA: Insufficient documentation

## 2017-10-11 DIAGNOSIS — Z794 Long term (current) use of insulin: Secondary | ICD-10-CM | POA: Insufficient documentation

## 2017-10-11 DIAGNOSIS — I480 Paroxysmal atrial fibrillation: Secondary | ICD-10-CM | POA: Insufficient documentation

## 2017-10-11 DIAGNOSIS — I350 Nonrheumatic aortic (valve) stenosis: Secondary | ICD-10-CM | POA: Diagnosis not present

## 2017-10-11 DIAGNOSIS — N183 Chronic kidney disease, stage 3 unspecified: Secondary | ICD-10-CM

## 2017-10-11 DIAGNOSIS — Z79899 Other long term (current) drug therapy: Secondary | ICD-10-CM | POA: Insufficient documentation

## 2017-10-11 DIAGNOSIS — R001 Bradycardia, unspecified: Secondary | ICD-10-CM | POA: Insufficient documentation

## 2017-10-11 DIAGNOSIS — I712 Thoracic aortic aneurysm, without rupture: Secondary | ICD-10-CM | POA: Diagnosis not present

## 2017-10-11 NOTE — Patient Instructions (Signed)
Stop eating pickles and cheese crackers!  Follow up 2 months with [name] NP-C.  ____________________________________________________________ [name] Code:  1100  Take all medication as prescribed the day of your appointment. Bring all medications with you to your appointment.  Do the following things EVERYDAY: 1) Weigh yourself in the morning before breakfast. Write it down and keep it in a log. 2) Take your medicines as prescribed 3) Eat low salt foods-Limit salt (sodium) to 2000 mg per day.  4) Stay as active as you can everyday 5) Limit all fluids for the day to less than 2 liters

## 2017-10-11 NOTE — Telephone Encounter (Signed)
Of note --Patient is being seen by [name]- Clinical biochemist through the [facility].  He is seen in the [facility] and his care is managed through the care team at [facility].  [name] is concerned about the patient's high sodium diet and has also recognized that the patient's aid and care team needs to provide diet education as well as compliance reinforcement.

## 2017-10-11 NOTE — Progress Notes (Signed)
Patient ID: [name], male   DOB: 09-May-1946, 72 y.o.   MRN: [id number]   PCP: [name] Cardiology: Dr. Sallyanne Kuster HF Cardiology: Dr. Aundra Dubin Nephrologist: Dr. Merlene Morse Harcum is a 72 y.o. 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   He was admitted most recently in 11/18 with acute respiratory failure => intubated.  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  He was treated for acute on chronic diastolic CHF with IV diuresis.  He has remained in NSR.  Today he returns for HF follow up. Overall feeling fine. Denies PND/Orthopnea. No chest pain. Uses CPAP nightly. Appetite ok. No fever or chills. He has not been weighing at home. Dee with [facility] talked to him about his medications and determined he has not been taking them as ordered because he doesn't like his aide like his aide to give him the medications. He has an aide about 5 hours a day.  She has also noticed that he has gallon jars of pickles in every room and eats cheese crackers for a snack.  Taking all medications. Limited mobility. Uses a wheel chair outside the home.    10/18 showed EF 60-65% with moderate AS.   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 Chronic diastolic CHF with RV dysfunction: Echo (2/15) with EF 60%, mild LVH, mild aortic stenosis with mean gradient 18 mmHg, RV mildly dilated with mildly decreased systolic function.  - Echo (10/18): EF 60-65%, moderate aortic stenosis.  2. Aortic stenosis: Moderate on 10/18 echo.   3. Type II diabetes 4. 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  5. OSA: Uses CPAP.  6. Morbid obesity.  7. HTN 8. CAD: Nonobstructive.   [place] 2005 with 60% LAD stenosis. 9. 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  10. Atrial fibrillation: Paroxysmal.  None recently.  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  11. CKD stage III 12. Macular degeneration: Legally blind.  13. 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  14. 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 15. S/p left foot amputation 16. Diabetic peripheral neuropathy.   Social History   Socioeconomic History  . Marital status: Single    Spouse name: Not on file  . Number of children: Y  . Years of education: Not on file  . Highest education level: Not on file  Social Needs  . Financial resource strain: Not on file  . Food insecurity - worry: Not on file  . Food insecurity - inability: Not on file  . Transportation needs - medical: Not on file  . Transportation needs - non-medical: Not on file  Occupational History  . Occupation: disabled  Tobacco Use  . Smoking status: Former Smoker    Packs/day: 0.00    Years: 0.00    Pack years: 0.00    Types: Cigarettes    Last attempt to quit: 08/17/2003    Years since quitting: 14.1  . Smokeless tobacco: Never Used  . Tobacco comment: started at age 64.  only smoked on occ- very rarely.0  Substance and Sexual Activity  . Alcohol use: Yes    Comment: 06/23/2013 "drank some; never had a problem w/it"  . Drug  use: No  . Sexual activity: No  Other Topics Concern  . Not on file  Social History Narrative  . Not on file    Family History  Problem Relation Age of Onset  . Coronary artery disease Sister   . Heart disease Sister   . Allergies Sister   . Asthma Brother   . Rheum arthritis Brother    Review of systems complete and found to be negative unless listed in HPI.    Current Outpatient Medications  Medication Sig Dispense Refill  . acetaminophen (TYLENOL) 650 MG CR tablet Take 1,300 mg by mouth 2 (two) times daily.     Marland Kitchen albuterol (PROVENTIL HFA;VENTOLIN HFA) 108 (90  Base) MCG/ACT inhaler Inhale 2 puffs into the lungs every 6 (six) hours as needed for wheezing or shortness of breath.    Marland Kitchen amLODipine (NORVASC) 10 MG tablet Take 10 mg by mouth daily. Reported on 12/09/2015    . aspirin 325 MG tablet Take 325 mg by mouth daily.    Marland Kitchen atorvastatin (LIPITOR) 40 MG tablet Take 1 tablet (40 mg total) by mouth daily. 90 tablet 3  . cholecalciferol (VITAMIN D) 1000 units tablet Take 1,000 Units by mouth daily.    . citalopram (CELEXA) 20 MG tablet Take 20 mg by mouth daily.    Marland Kitchen gabapentin (NEURONTIN) 300 MG capsule Take 300 mg by mouth 2 (two) times daily.    . hydrALAZINE (APRESOLINE) 50 MG tablet Take 1 tablet (50 mg total) 3 (three) times daily by mouth. 90 tablet 0  . Insulin Glargine (TOUJEO SOLOSTAR) 300 UNIT/ML SOPN Inject 80 Units into the skin 2 (two) times daily.     Marland Kitchen lidocaine (LIDODERM) 5 % Place 1 patch onto the skin daily as needed (back pain). Remove & Discard patch within 12 hours or as directed by MD    . Liraglutide (VICTOZA) 18 MG/3ML SOPN Inject 1.8 mg into the skin daily.    . Melatonin 3 MG CAPS Take 1 capsule by mouth at bedtime as needed (sleep).     . Menthol, Topical Analgesic, (BIOFREEZE EX) Apply 1 application topically 3 (three) times daily as needed (pain). Reported on 10/27/2015    . methylcellulose (ARTIFICIAL TEARS) 1 % ophthalmic solution Place 1 drop into both eyes 2 (two) times daily as needed (dry eyes).     . metolazone (ZAROXOLYN) 2.5 MG tablet Take 1 tablet on Monday, Thursday, and Saturday mornings 20 tablet 11  . metoprolol succinate (TOPROL-XL) 50 MG 24 hr tablet Take 1 tablet (50 mg total) daily by mouth. Take with or immediately following a meal. 30 tablet 0  . polyethylene glycol (MIRALAX / GLYCOLAX) packet Take 17 g by mouth daily as needed for moderate constipation.    . potassium chloride SA (K-DUR,KLOR-CON) 20 MEQ tablet Take 60 mEq (3 Tabs) in the AM and 40 mEq (2 Tabs) in the PM. 150 tablet 3  . ranitidine (ZANTAC) 150  MG tablet Take 150 mg by mouth daily.     . sennosides-docusate sodium (SENOKOT-S) 8.6-50 MG tablet Take 1 tablet by mouth 2 (two) times daily.     . Skin Protectants, Misc. (EUCERIN) cream Apply 1 application topically 2 (two) times daily.    Marland Kitchen torsemide (DEMADEX) 100 MG tablet Take 100 mg by mouth 2 (two) times daily.     No current facility-administered medications for this encounter.    Facility-Administered Medications Ordered in Other Encounters  Medication Dose Route Frequency Provider Last Rate Last Dose  .  chlorhexidine gluconate (MEDLINE KIT) (PERIDEX) 0.12 % solution 15 mL  15 mL Mouth Rinse BID Chesley Mires, MD      . MEDLINE mouth rinse  15 mL Mouth Rinse QID Sood, Vineet, MD       BP 138/70 (BP Location: Left Arm, Patient Position: Sitting, Cuff Size: Large)   Wt (!) 326 lb 6.4 oz (148.1 kg)   BMI 44.89 kg/m    Wt Readings from Last 3 Encounters:  10/11/17 (!) 326 lb 6.4 oz (148.1 kg)  09/13/17 (!) 323 lb (146.5 kg)  08/15/17 294 lb 12.1 oz (133.7 kg)    General:  Arrived in a wheel chair. No resp difficulty HEENT: normal Neck: supple. JVD ~10 . Carotids 2+ bilat; no bruits. No lymphadenopathy or thryomegaly appreciated. Cor: PMI nondisplaced. Regular rate & rhythm. No rubs, gallops or murmurs. Lungs: Decreased in throughout Abdomen: obese, soft, nontender, nondistended. No hepatosplenomegaly. No bruits or masses. Good bowel sounds. Extremities: no cyanosis, clubbing, rash, R and LLE 1+ edema Neuro: alert & orientedx3, cranial nerves grossly intact. moves all 4 extremities w/o difficulty. Affect pleasant  Assessment/Plan: 1. Chronic diastolic CHF with prominent RV failure: Echo in 10/18 with EF 60-65%.   NYHA III. Volume status mildly elevated. I am not going to increase diuretics because I am hopeful he can avoid/cut back salty foods such as pickles and cheese crackers.  Continue torsemide 100 mg twice a day + metolazone 3 times a week.  2. Aortic stenosis: Echo in  10/18 with moderate AS.   3. Atrial fibrillation: Paroxysmal.   Regular pulse.  He is not anticoagulated (takes ASA) due to prior intracerebral hemorrhage.  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  4. CAD: Nonobstructive.   No S/S ischemia.   - Continue ASA and statin.   5. Bradycardia s/p Medtronic PPM  6. CKD: Stage III 7. OSA: Continue CPAP nightly.  8. Ascending aorta aneurysm: 4.0 cm on 11/18 CT chest. Repeat imaging 06/2018 .   Greater than 50% of the (total minutes 25) visit spent in counseling/coordination of care regarding HF diet and medication compliance. He was asked to eliminate pickles and cheese crackers.   Follow up in 2 months.    Darrick Grinder, NP  10/11/2017

## 2017-10-14 ENCOUNTER — Ambulatory Visit
Admission: RE | Admit: 2017-10-14 | Discharge: 2017-10-14 | Disposition: A | Discharge status: Home / Self Care | Payer: Medicare (Managed Care) | Source: Ambulatory Visit | Attending: Nurse Practitioner | Admitting: Nurse Practitioner

## 2017-10-14 ENCOUNTER — Other Ambulatory Visit: Payer: Self-pay | Admitting: Nurse Practitioner

## 2017-10-14 DIAGNOSIS — R0602 Shortness of breath: Secondary | ICD-10-CM

## 2017-11-09 ENCOUNTER — Ambulatory Visit (INDEPENDENT_AMBULATORY_CARE_PROVIDER_SITE_OTHER): Payer: Medicare (Managed Care) | Admitting: Cardiovascular Disease

## 2017-11-09 ENCOUNTER — Encounter: Payer: Self-pay | Admitting: Cardiovascular Disease

## 2017-11-09 VITALS — BP 144/74 | HR 68 | Ht 71.5 in | Wt 322.0 lb

## 2017-11-09 DIAGNOSIS — G4733 Obstructive sleep apnea (adult) (pediatric): Secondary | ICD-10-CM | POA: Diagnosis not present

## 2017-11-09 DIAGNOSIS — E0822 Diabetes mellitus due to underlying condition with diabetic chronic kidney disease: Secondary | ICD-10-CM | POA: Diagnosis not present

## 2017-11-09 DIAGNOSIS — I10 Essential (primary) hypertension: Secondary | ICD-10-CM

## 2017-11-09 DIAGNOSIS — I5032 Chronic diastolic (congestive) heart failure: Secondary | ICD-10-CM

## 2017-11-09 DIAGNOSIS — I480 Paroxysmal atrial fibrillation: Secondary | ICD-10-CM | POA: Diagnosis not present

## 2017-11-09 DIAGNOSIS — Z794 Long term (current) use of insulin: Secondary | ICD-10-CM

## 2017-11-09 DIAGNOSIS — Z950 Presence of cardiac pacemaker: Secondary | ICD-10-CM

## 2017-11-09 DIAGNOSIS — I495 Sick sinus syndrome: Secondary | ICD-10-CM

## 2017-11-09 DIAGNOSIS — I2510 Atherosclerotic heart disease of native coronary artery without angina pectoris: Secondary | ICD-10-CM

## 2017-11-09 DIAGNOSIS — E782 Mixed hyperlipidemia: Secondary | ICD-10-CM

## 2017-11-09 DIAGNOSIS — N183 Chronic kidney disease, stage 3 unspecified: Secondary | ICD-10-CM

## 2017-11-09 DIAGNOSIS — I471 Supraventricular tachycardia: Secondary | ICD-10-CM | POA: Diagnosis not present

## 2017-11-09 NOTE — Patient Instructions (Signed)
Dr Sallyanne Kuster recommends that you continue on your current medications as directed. Please refer to the Current Medication list given to you today.  You have been referred to Dr Curt Bears.   Your physician recommends that you schedule an appointment in 3 months with Dr Curt Bears.  Dr Crotioru recommends that you schedule a follow-up appointment in 6 months with a pacemaker check.  If you need a refill on your cardiac medications before your next appointment, please call your pharmacy.

## 2017-11-09 NOTE — Progress Notes (Signed)
Patient ID: [name], male   DOB: [birth year], 72 y.o.   MRN: [id number]    Cardiology Office Note    Date:  11/11/2017   ID:  [name], DOB [birth year], MRN [id number]  PCP:  [name], MD  Cardiologist:   Sanda Klein, MD   Chief Complaint  Patient presents with  . Pacemaker Check    History of Present Illness:  [name] is a 72 y.o. male with long-standing diastolic heart failure, hypertensive heart disease, nonobstructive coronary artery disease by remote coronary angiography, moderate chronic kidney disease, obstructive sleep apnea, morbid obesity, type 2 diabetes mellitus complicated by blindness and severe peripheral neuropathy and recurrent poor wound healing, hyperlipidemia, hypertension who presents for pacemaker follow-up. It is important to note that he is blind and therefore requires assistance with transportation (uses [facility]) and has his medications prepacked in daily blister packs, prepared once weekly on Saturdays.  He is here for a pacemaker check and clinical follow-up.   He has been seen in the heart failure clinic.  Echo in August and in late October showed preserved left ventricular systolic function with ejection fraction around 60%, moderate LVH, mild to moderate aortic stenosis (mean gradient 20 mmHg), surprisingly normal size atria, of elevated left and right heart pressures.  He was in the hospital in November with acute hypoxic respiratory failure due to pulmonary edema, requiring intubation.  There was suspicion of amiodarone pulmonary toxicity since the infiltrates persisted following diuresis and the amio was stopped.  His ESR was 100.  He was given steroids.  Later, was hospitalized around [place] for urinary tract infection complicated by dehydration, hypernatremia and asterixis.  Reports that his weight has been checked daily at [facility] and that is consistently under 330 pounds.  Today 322 pounds and he is breathing  comfortably.  His blood pressure is borderline high.  He remains morbidly obese.  He is unaware of any palpitations and he denies dizziness, syncope, dyspnea at rest, chest pain or new focal neurological complaints.  He has chronic severe neuropathy in his extremities.  He feels like he has "frostbite" and he cannot walk because of that.  His device was implanted for symptomatic sinus bradycardia in 2005, with a generator change in 2014.   Pacemaker interrogation today shows that he has 55% atrial pacing and only 16% ventricular pacing.  Atrial pacing often occurs after atrial paced beats, when he has a longer AV delay. There has been no atrial fibrillation since his last check.  On the other hand he shows extremely frequent episodes of paroxysmal atrial tachycardia since mid January.  It is possible some of these may represent atrial flutter.  Increased burden of atrial arrhythmia roughly coincides with expected gradual loss of amiodarone effect.  He is not receiving anticoagulation.  Moderate chronic elevation in right ventricular pacing threshold, not an issue due to infrequent ventricular pacing, other lead parameters are all normal.  Despite his history of atrial fibrillation and stroke he is unable to receive anticoagulation due to a previous left parenchymal temporoparietal hematoma that required surgical evacuation. 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   Past Medical History:  Diagnosis Date  . Arthritis    "all over" (06/23/2013)  . Atrial fibrillation ([place])    not Candidate for Coumadin d/t rectal bleeding  . Benign prostatic hypertrophy   . Cardiomyopathy, hypertensive ([place])     diastolic dysfunction  by 2006 echo  . Coronary artery disease     nonobstructive. 2 vessel. cardiac catheter in 11-12-2003 showing 60% LAD occlusion, 30% circumflex occlusion.  . Degenerative joint disease     Right knee.  . Diabetes  type 2, uncontrolled ([place])   . Diabetic peripheral neuropathy ([place])   . Diabetic ulcer of thigh ([place])    H/O Rt thigh ulcer.  . Exertional shortness of breath    "comes and goes" (07/07/2013)  . GERD (gastroesophageal reflux disease)   . Gout   . Herpes   . Hyperlipidemia   . Hypertension   . Hypotension 09/05/2013  . Incarcerated ventral hernia    H/O. S/P repair ventral hernia repair 07/2005.  Marland Kitchen LBBB (left bundle branch block)   . Legally blind   . Morbid obesity ([place])   . Obstructive sleep apnea    On CPAP. Last sleep study (06/2009) - Severe obstructive sleep apnea/hypopnea syndrome, apnea-hypopnea index 70.5 per hour with non positional events moderately loud snoring and oxygen desaturation to a nadir of 85% on room air.  . Occult blood positive stool     History of in August 2007.  colonoscopy in January 2008 showing normal colon with fair inadequate prep. By Dr. Silvano Rusk.  . 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 Sepsis ([place]) 08/26/2013    Past Surgical History:  Procedure Laterality Date  . AMPUTATION Left 06/22/2013   Procedure: AMPUTATION FOOT;  Surgeon: Newt Minion, MD;  Location: [facility];  Service: Orthopedics;  Laterality: Left;  Left Midfoot Amputation  . CARDIAC CATHETERIZATION  10/22/2003   Dilatation of the LV with preserved LV systolic function. Tortous aorta, but no AAA. Moderate disease with 50-60% area of narrowing in the circumflex after the second OM and 70% mid narrowing in the LAD. No intervention.  Marland Kitchen CARDIOVASCULAR STRESS TEST  01/23/2010   Perfusion defect seen in inferior myocardial region is consistent with diaphragmatic attenuation. Remaining myocardium demonstrates normal myocardial perfusion with no evidence of ischemia or infarct. Stress ECG was non-diagnostic due to LBBB.  Marland Kitchen CATARACT EXTRACTION, BILATERAL Bilateral   . ESOPHAGOGASTRODUODENOSCOPY N/A 10/12/2013   Procedure: ESOPHAGOGASTRODUODENOSCOPY (EGD);  Surgeon: Gwenyth Ober, MD;   Location: [facility];  Service: General;  Laterality: N/A;  bedside  . EYE SURGERY    . 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 HERNIA REPAIR    . INSERT / REPLACE / REMOVE PACEMAKER  Nov 12, 2003   Secondary to symptomatic bradycardia. DDDR pacemaker. By Dr. Rollene Fare.  Randolm Idol / REPLACE / REMOVE PACEMAKER  November 11, 2012   "old pacemaker died; had new one put in" (07-07-13)  . Pacemaker lead revision  12/2003    likely microdislodgment  of right ventricular lead  . PEG PLACEMENT N/A 10/12/2013   Procedure: PERCUTANEOUS ENDOSCOPIC GASTROSTOMY (PEG) PLACEMENT;  Surgeon: Gwenyth Ober, MD;  Location: [facility];  Service: General;  Laterality: N/A;  . PERMANENT PACEMAKER GENERATOR CHANGE N/A 11/03/2012   Procedure: PERMANENT PACEMAKER GENERATOR CHANGE;  Surgeon: Sanda Klein, MD;  Location: [facility];  Service: Cardiovascular;  Laterality: N/A;  . REFRACTIVE SURGERY Bilateral   . TRANSTHORACIC ECHOCARDIOGRAM  01/01/2013   EF 50-55%, ventricular septum-thickness moderately increased  . VENOUS DOPPLER  01/17/2013   No evidence of thrombus or thrombophlebitis. Left GSVs appear enlarged and demonstrate valvular insuffiiciency. Bilateral SSVs appeared patent with no valvular insufficiency seen.  Marland Kitchen VENTRAL HERNIA REPAIR  07/2005    S/P laparotomy,  lysis of adhesions, repair of incarcerated ventral hernia with  partial omentectomy.. Performed by Dr. Marlou Starks.    Outpatient Medications Prior to Visit  Medication Sig Dispense Refill  . acetaminophen (TYLENOL) 650 MG CR tablet Take 1,300 mg by mouth 2 (two) times daily.     Marland Kitchen albuterol (PROVENTIL HFA;VENTOLIN HFA) 108 (90 Base) MCG/ACT inhaler Inhale 2 puffs into the lungs every 6 (six) hours as needed for wheezing or shortness of breath.    Marland Kitchen amLODipine (NORVASC) 10 MG tablet Take 10 mg by mouth daily. Reported on 12/09/2015    . aspirin 325 MG tablet Take 325 mg by mouth daily.    Marland Kitchen atorvastatin (LIPITOR) 40 MG tablet  Take 1 tablet (40 mg total) by mouth daily. 90 tablet 3  . cholecalciferol (VITAMIN D) 1000 units tablet Take 1,000 Units by mouth daily.    . citalopram (CELEXA) 20 MG tablet Take 20 mg by mouth daily.    . Febuxostat (ULORIC) 80 MG TABS Take 1 tablet by mouth daily.    Marland Kitchen gabapentin (NEURONTIN) 300 MG capsule Take 300 mg by mouth 2 (two) times daily.    . hydrALAZINE (APRESOLINE) 50 MG tablet Take 1 tablet (50 mg total) 3 (three) times daily by mouth. 90 tablet 0  . Insulin Glargine (TOUJEO SOLOSTAR) 300 UNIT/ML SOPN Inject 45 Units into the skin 2 (two) times daily.     Marland Kitchen lidocaine (LIDODERM) 5 % Place 1 patch onto the skin daily as needed (back pain). Remove & Discard patch within 12 hours or as directed by MD    . Liraglutide (VICTOZA) 18 MG/3ML SOPN Inject 1.8 mg into the skin daily.    . Melatonin 3 MG CAPS Take 1 capsule by mouth at bedtime as needed (sleep).     . Menthol, Topical Analgesic, (BIOFREEZE EX) Apply 1 application topically 3 (three) times daily as needed (pain). Reported on 10/27/2015    . methylcellulose (ARTIFICIAL TEARS) 1 % ophthalmic solution Place 1 drop into both eyes 2 (two) times daily as needed (dry eyes).     . metolazone (ZAROXOLYN) 2.5 MG tablet Take 1 tablet on Monday, Thursday, and Saturday mornings 20 tablet 11  . metoprolol succinate (TOPROL-XL) 50 MG 24 hr tablet Take 1 tablet (50 mg total) daily by mouth. Take with or immediately following a meal. 30 tablet 0  . polyethylene glycol (MIRALAX / GLYCOLAX) packet Take 17 g by mouth daily as needed for moderate constipation.    . potassium chloride SA (K-DUR,KLOR-CON) 20 MEQ tablet Take 60 mEq (3 Tabs) in the AM and 40 mEq (2 Tabs) in the PM. 150 tablet 3  . ranitidine (ZANTAC) 150 MG tablet Take 150 mg by mouth daily.     . sennosides-docusate sodium (SENOKOT-S) 8.6-50 MG tablet Take 1 tablet by mouth 2 (two) times daily.     . Skin Protectants, Misc. (EUCERIN) cream Apply 1 application topically 2 (two) times  daily.    Marland Kitchen torsemide (DEMADEX) 100 MG tablet Take 100 mg by mouth 2 (two) times daily.     Facility-Administered Medications Prior to Visit  Medication Dose Route Frequency Provider Last Rate Last Dose  . chlorhexidine gluconate (MEDLINE KIT) (PERIDEX) 0.12 % solution 15 mL  15 mL Mouth Rinse BID Chesley Mires, MD      . MEDLINE mouth rinse  15 mL Mouth Rinse QID Chesley Mires, MD         Allergies:   Patient has no known allergies.   Social History  Socioeconomic History  . Marital status: Single    Spouse name: Not on file  . Number of children: Y  . Years of education: Not on file  . Highest education level: Not on file  Occupational History  . Occupation: disabled  Social Needs  . Financial resource strain: Not on file  . Food insecurity:    Worry: Not on file    Inability: Not on file  . Transportation needs:    Medical: Not on file    Non-medical: Not on file  Tobacco Use  . Smoking status: Former Smoker    Packs/day: 0.00    Years: 0.00    Pack years: 0.00    Types: Cigarettes    Last attempt to quit: 08/17/2003    Years since quitting: 14.2  . Smokeless tobacco: Never Used  . Tobacco comment: started at age 26.  only smoked on occ- very rarely.0  Substance and Sexual Activity  . Alcohol use: Yes    Comment: 06/23/2013 "drank some; never had a problem w/it"  . Drug use: No  . Sexual activity: Never  Lifestyle  . Physical activity:    Days per week: Not on file    Minutes per session: Not on file  . Stress: Not on file  Relationships  . Social connections:    Talks on phone: Not on file    Gets together: Not on file    Attends religious service: Not on file    Active member of club or organization: Not on file    Attends meetings of clubs or organizations: Not on file    Relationship status: Not on file  Other Topics Concern  . Not on file  Social History Narrative  . Not on file     Family History:  The patient's family history includes Allergies in  his sister; Asthma in his brother; Coronary artery disease in his sister; Heart disease in his sister; Rheum arthritis in his brother.   ROS:   Please see the history of present illness.    ROS All other systems reviewed and are negative.   PHYSICAL EXAM:   VS:  BP (!) 144/74   Pulse 68   Ht 5' 11.5" (1.816 m)   Wt (!) 322 lb (146.1 kg)   BMI 44.28 kg/m      General: Alert, oriented x3, no distress, obese, blind Head: no evidence of trauma, PERRL, EOMI, no exophtalmos or lid lag, no myxedema, no xanthelasma; normal ears, nose and oropharynx Neck: normal jugular venous pulsations and no hepatojugular reflux; brisk carotid pulses without delay and no carotid bruits Chest: clear to auscultation, no signs of consolidation by percussion or palpation, normal fremitus, symmetrical and full respiratory excursions.  The pacemaker site is healthy. Cardiovascular: normal position and quality of the apical impulse, regular rhythm, normal first and second heart sounds, no murmurs, rubs or gallops Abdomen: no tenderness or distention, no masses by palpation, no abnormal pulsatility or arterial bruits, normal bowel sounds, no hepatosplenomegaly Extremities: no clubbing, cyanosis or edema; 2+ radial, ulnar and brachial pulses bilaterally; 2+ right femoral, posterior tibial and dorsalis pedis pulses; 2+ left femoral, posterior tibial and dorsalis pedis pulses; no subclavian or femoral bruits Neurological: grossly nonfocal except for blindness and peripheral neuropathy Psych: Normal mood and affect    Wt Readings from Last 3 Encounters:  11/09/17 (!) 322 lb (146.1 kg)  10/11/17 (!) 326 lb 6.4 oz (148.1 kg)  09/13/17 (!) 323 lb (146.5 kg)  Studies/Labs Reviewed:   EKG:  EKG is ordered today.  The rhythm is atrial paced, ventricular sensed with a single PVC. He has left axis deviation almost meeting criteria for left anterior fascicular block, both without clearcut Q waves in leads 1 and aVL.  He has a QS pattern in leads V1 through V3 Recent Labs: 01/05/2017: TSH 2.010 08/15/2017: B Natriuretic Peptide 57.2; Magnesium 2.1 08/16/2017: ALT 26 08/17/2017: BUN 54; Creatinine, Ser 2.16; Hemoglobin 13.6; Platelets 156; Potassium 3.5; Sodium 138   Lipid Panel    Component Value Date/Time   CHOL 164 07/27/2017 1439   CHOL 129 01/05/2017 1235   TRIG 179 (H) 07/27/2017 1439   HDL 38 (L) 07/27/2017 1439   HDL 36 (L) 01/05/2017 1235   CHOLHDL 4.3 07/27/2017 1439   VLDL 36 07/27/2017 1439   LDLCALC 90 07/27/2017 1439   LDLCALC 71 01/05/2017 1235    Additional studies/ records that were reviewed today include:  Heart Failure clinic notes    ASSESSMENT:    1. Chronic diastolic heart failure ([place])   2. SSS (sick sinus syndrome) ([place])   3. Pacemaker   4. PAF (paroxysmal atrial fibrillation) ([place])   5. Atrial tachycardia ([place])   6. Coronary artery disease involving native coronary artery of native heart without angina pectoris   7. Essential hypertension   8. OSA (obstructive sleep apnea)   9. CKD (chronic kidney disease) stage 3, GFR 30-59 ml/min (HCC)   10. Morbid obesity ([place])   11. Mixed hyperlipidemia   12. Diabetes mellitus due to underlying condition, controlled, with stage 3 chronic kidney disease, with long-term current use of insulin (H[facility])      PLAN:  In order of problems listed above:   1. CHF: Physical exam is difficult due to morbid obesity, but I believe he is euvolemic.  If his oral intake decreases his dose of diuretics needs to be reduced.  He will maintain a very fragile balance between renal failure and heart failure, was seen with his back to back hospitalizations a few months ago.  Keep his weight in the 320-330 pound range.  Continue same diuretic regimen.  Avoid nephrotoxic agents.  Unable to prescribe RAAS inhibitors.  Cardiac amyloidosis is considered, but it is more likely that he has diabetic/hypertensive cardiomyopathy. 2. SSS: Pacemaker was programmed  at a lower rate limit of only 50 bpm in order to promote normal atrial activation.  Atrial paced beats are associated with extremely long AV delay which is nonphysiological, may promote worsening heart failure.  With the current lower rate limit atrial pacing has decreased from 90% to only 55%.  I am reluctant to decrease the dose of metoprolol due to the high burden of paroxysmal atrial tachycardia and the risk of atrial fibrillation. 3. PPM: Normal device function, but chronically elevated right ventricular pacing threshold. There are many reasons to avoid ventricular pacing including congestive heart failure and the increased drain on the battery. Thankfully, MVP has managed to prevent ventricular pacing despite very long AV delay.  We have been unsuccessful at setting him up for remote pacemaker downloads.  We will check his pacemaker in the office every 6 months. 4. PAF: despite high embolic risk not be anticoagulated due to history of intracranial hemorrhage. No atrial fibrillation has been recorded since his last device check, he does have increasing frequency of atrial tachycardia. 5. PAT: We will ask for EP evaluation for advice regarding management of the increasingly high burden of atrial tachycardia.  Amiodarone was  stopped due to suspicion of lung toxicity.  Dofetilide and sotalol may be dangerous with his abnormal and volatile kidney function and any antiarrhythmic will be associated with increased atrial and ventricular pacing with disadvantages as described above.  Ablation can be considered, but his obesity and comorbid conditions will be a big disincentive. 6. CAD: Does not have angina pectoris.  Had nonobstructive lesions by last coronary angiogram 7. HTN: Control is not perfect, but is acceptable.  No changes made to his medications 8. OSA: 100% compliance with CPAP wrongly recommended 9. CKD: Followed at [facility]. Stable renal function.  Creatinine approximately 2.2, GFR around  30. 10. Obesity: His obesity is definitely playing a big part in his heart failure and sleep apnea. 11. HLP: On statin. LDL checked in December was higher than earlier in the year, possibly due to imperfect compliance with statin during his acute illnesses.  Target LDL 70 or less. 12. DM: Control is not bad.  Hemoglobin A1c 7.1% last December.    Medication Adjustments/Labs and Tests Ordered: Current medicines are reviewed at length with the patient today.  Concerns regarding medicines are outlined above.  Medication changes, Labs and Tests ordered today are listed in the Patient Instructions below. Patient Instructions  Dr Sallyanne Kuster recommends that you continue on your current medications as directed. Please refer to the Current Medication list given to you today.  You have been referred to Dr Curt Bears.   Your physician recommends that you schedule an appointment in 3 months with Dr Curt Bears.  Dr Crotioru recommends that you schedule a follow-up appointment in 6 months with a pacemaker check.  If you need a refill on your cardiac medications before your next appointment, please call your pharmacy.      Signed, Sanda Klein, MD  11/11/2017 11:48 AM    [facility] [address], [place], [place]  [address] Phone: [phone]; Fax: [phone]

## 2017-11-11 ENCOUNTER — Encounter: Payer: Self-pay | Admitting: Cardiovascular Disease

## 2017-11-11 DIAGNOSIS — I5032 Chronic diastolic (congestive) heart failure: Secondary | ICD-10-CM | POA: Insufficient documentation

## 2017-11-28 NOTE — Progress Notes (Signed)
Patient ID: [name], male   DOB: Mar 20, 1946, 72 y.o.   MRN: [id number]   PCP: [name] Cardiology: Dr. Sallyanne Kuster HF Cardiology: Dr. Aundra Dubin Nephrologist: Dr. Merlene Morse Yanik is a 72 y.o. 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   He was admitted most recently in 11/18 with acute respiratory failure => intubated.  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  He was treated for acute on chronic diastolic CHF with IV diuresis.  He has remained in NSR.  He presents today for 2 month follow up. No med changes last visit. Counseled extensively on salt and fluid restriction. Weight is down 16 lbs. He is feeling better overall. He is watching his salt and fluid. He now has paramedicine through [facility] and is doing much better with this asset in place.  Taking all medications as directed (having pill boxes filled). Denies SOB with ADLs such as bathing, dressing, and toilleting. Uses a WC outside of the home.    10/18 showed EF 60-65% with moderate AS.   Labs (11/18): hgb 14.8, K 4.6, creatinine 2.34 Labs (08/17/2017): K 3.5 Creatinine 2.16   ECG: NSR with 1st degree AVB, LAFB, old ASMI  PMH: 1. Chronic diastolic CHF with RV dysfunction: Echo (2/15) with EF 60%, mild LVH, mild aortic stenosis with mean gradient 18 mmHg, RV mildly dilated with mildly decreased systolic function.  - Echo (10/18): EF 60-65%, moderate aortic stenosis.  2. Aortic stenosis: Moderate on 10/18 echo.   3. Type II diabetes 4. 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  5. OSA: Uses CPAP.  6. Morbid obesity.  7. HTN 8. CAD: Nonobstructive.  [place] 2005 with 60% LAD stenosis. 9. 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  10. Atrial fibrillation: Paroxysmal.  None recently.   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  11. CKD stage III 12. Macular degeneration: Legally blind.  13. 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  14. 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 15. S/p left foot amputation 16. Diabetic peripheral neuropathy.   Social History   Socioeconomic History  . Marital status: Single    Spouse name: Not on file  . Number of children: Y  . Years of education: Not on file  . Highest education level: Not on file  Occupational History  . Occupation: disabled  Social Needs  . Financial resource strain: Not on file  . Food insecurity:    Worry: Not on file    Inability: Not on file  . Transportation needs:    Medical: Not on file    Non-medical: Not on file  Tobacco Use  . Smoking status: Former Smoker    Packs/day: 0.00    Years: 0.00    Pack years: 0.00    Types: Cigarettes    Last attempt to quit: 08/17/2003    Years since quitting: 14.2  . Smokeless tobacco: Never Used  . Tobacco comment: started at age 49.  only smoked on occ- very rarely.0  Substance and Sexual Activity  . Alcohol use: Yes    Comment: 06/23/2013 "drank some; never had a problem w/it"  . Drug use: No  . Sexual activity: Never  Lifestyle  . Physical activity:    Days per week: Not on file    Minutes per session: Not on  file  . Stress: Not on file  Relationships  . Social connections:    Talks on phone: Not on file    Gets together: Not on file    Attends religious service: Not on file    Active member of club or organization: Not on file    Attends meetings of clubs or organizations: Not on file    Relationship status: Not on file  . Intimate partner violence:    Fear of current or ex partner: Not on file    Emotionally abused: Not on file    Physically abused: Not on file    Forced sexual activity: Not on file  Other Topics Concern  . Not on file  Social History Narrative  . Not on file    Family History  Problem Relation Age of Onset  .  Coronary artery disease Sister   . Heart disease Sister   . Allergies Sister   . Asthma Brother   . Rheum arthritis Brother    Review of systems complete and found to be negative unless listed in HPI.    Current Outpatient Medications  Medication Sig Dispense Refill  . acetaminophen (TYLENOL) 650 MG CR tablet Take 1,300 mg by mouth 2 (two) times daily.     Marland Kitchen albuterol (PROVENTIL HFA;VENTOLIN HFA) 108 (90 Base) MCG/ACT inhaler Inhale 2 puffs into the lungs every 6 (six) hours as needed for wheezing or shortness of breath.    Marland Kitchen amLODipine (NORVASC) 10 MG tablet Take 10 mg by mouth daily. Reported on 12/09/2015    . aspirin 325 MG tablet Take 325 mg by mouth daily.    Marland Kitchen atorvastatin (LIPITOR) 40 MG tablet Take 1 tablet (40 mg total) by mouth daily. 90 tablet 3  . cholecalciferol (VITAMIN D) 1000 units tablet Take 1,000 Units by mouth daily.    . citalopram (CELEXA) 20 MG tablet Take 20 mg by mouth daily.    . Febuxostat (ULORIC) 80 MG TABS Take 1 tablet by mouth daily.    Marland Kitchen gabapentin (NEURONTIN) 300 MG capsule Take 300 mg by mouth 2 (two) times daily.    . hydrALAZINE (APRESOLINE) 50 MG tablet Take 1 tablet (50 mg total) 3 (three) times daily by mouth. 90 tablet 0  . Insulin Glargine (TOUJEO SOLOSTAR) 300 UNIT/ML SOPN Inject 45 Units into the skin 2 (two) times daily.     Marland Kitchen lidocaine (LIDODERM) 5 % Place 1 patch onto the skin daily as needed (back pain). Remove & Discard patch within 12 hours or as directed by MD    . Liraglutide (VICTOZA) 18 MG/3ML SOPN Inject 1.8 mg into the skin daily.    . Melatonin 3 MG CAPS Take 1 capsule by mouth at bedtime as needed (sleep).     . Menthol, Topical Analgesic, (BIOFREEZE EX) Apply 1 application topically 3 (three) times daily as needed (pain). Reported on 10/27/2015    . methylcellulose (ARTIFICIAL TEARS) 1 % ophthalmic solution Place 1 drop into both eyes 2 (two) times daily as needed (dry eyes).     . metolazone (ZAROXOLYN) 2.5 MG tablet Take 1 tablet  on Monday, Thursday, and Saturday mornings 20 tablet 11  . metoprolol succinate (TOPROL-XL) 50 MG 24 hr tablet Take 1 tablet (50 mg total) daily by mouth. Take with or immediately following a meal. 30 tablet 0  . polyethylene glycol (MIRALAX / GLYCOLAX) packet Take 17 g by mouth daily as needed for moderate constipation.    . potassium chloride SA (K-DUR,KLOR-CON) 20  MEQ tablet Take 60 mEq (3 Tabs) in the AM and 40 mEq (2 Tabs) in the PM. 150 tablet 3  . ranitidine (ZANTAC) 150 MG tablet Take 150 mg by mouth daily.     . sennosides-docusate sodium (SENOKOT-S) 8.6-50 MG tablet Take 1 tablet by mouth 2 (two) times daily.     . Skin Protectants, Misc. (EUCERIN) cream Apply 1 application topically 2 (two) times daily.    Marland Kitchen torsemide (DEMADEX) 100 MG tablet Take 100 mg by mouth 2 (two) times daily.     No current facility-administered medications for this encounter.    Facility-Administered Medications Ordered in Other Encounters  Medication Dose Route Frequency Provider Last Rate Last Dose  . chlorhexidine gluconate (MEDLINE KIT) (PERIDEX) 0.12 % solution 15 mL  15 mL Mouth Rinse BID Chesley Mires, MD      . MEDLINE mouth rinse  15 mL Mouth Rinse QID Chesley Mires, MD       Vitals:   11/29/17 0940  BP: 120/68  Pulse: 70  SpO2: 90%  Weight: (!) 310 lb 9.6 oz (140.9 kg)    Wt Readings from Last 3 Encounters:  11/29/17 (!) 310 lb 9.6 oz (140.9 kg)  11/09/17 (!) 322 lb (146.1 kg)  10/11/17 (!) 326 lb 6.4 oz (148.1 kg)    Physical Exam General: Arrived in [facility]. No resp difficulty. HEENT: Normal Neck: Supple. JVP difficult, but does not appear elevated. Carotids 2+ bilat; no bruits. No thyromegaly or nodule noted. Cor: PMI nondisplaced. RRR, No M/G/R noted Lungs: CTAB, normal effort. Abdomen: Soft, non-tender, non-distended, no HSM. No bruits or masses. +BS  Extremities: No cyanosis, clubbing, or rash. Chronic trace to 1+ edema.  Neuro: Alert & orientedx3, cranial nerves grossly intact.  moves all 4 extremities w/o difficulty. Affect pleasant   Assessment/Plan: 1. Chronic diastolic CHF with prominent RV failure: Echo in 10/18 with EF 60-65%.   - NYHA III - Volume status stable on exam.  - Continue torsemide 100 mg BID - Continue metolazone 2 times weekly.  - Reinforced fluid restriction to < 2 L daily, sodium restriction to less than 2000 mg daily, and the importance of daily weights.   2. Aortic stenosis: Echo in 10/18 with moderate AS.   - No need to repeat at this time.  3. Atrial fibrillation: Paroxysmal.   - Regular on exam.  - He is not anticoagulated (takes ASA) due to prior intracerebral hemorrhage.  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  - Stable.  4. CAD: Nonobstructive.   - No s/s of ischemia.    - Continue ASA and statin.   5. Bradycardia s/p Medtronic PPM  - Stable.  6. CKD: Stage III - BMET today.  7. OSA:  - Encouraged nightly CPAP use.  8. Ascending aorta aneurysm: 4.0 cm on 11/18 CT chest. - Repeat imaging 06/2018 . Stable.  Doing well overall. Much improved with paramedicine support.  RTC 2 months. BMET today.   Shirley Friar, PA-C  11/29/2017   Greater than 50% of the 25 minute visit was spent in counseling/coordination of care regarding disease state education, salt/fluid restriction, sliding scale diuretics, and medication compliance.

## 2017-11-29 ENCOUNTER — Other Ambulatory Visit (HOSPITAL_COMMUNITY): Payer: Self-pay | Admitting: *Deleted

## 2017-11-29 ENCOUNTER — Ambulatory Visit (HOSPITAL_COMMUNITY)
Admission: RE | Admit: 2017-11-29 | Discharge: 2017-11-29 | Disposition: A | Discharge status: Home / Self Care | Payer: Medicare (Managed Care) | Source: Ambulatory Visit | Attending: Cardiology | Admitting: Cardiology

## 2017-11-29 ENCOUNTER — Encounter (HOSPITAL_COMMUNITY): Payer: Self-pay

## 2017-11-29 VITALS — BP 120/68 | HR 70 | Wt 310.6 lb

## 2017-11-29 DIAGNOSIS — I5032 Chronic diastolic (congestive) heart failure: Secondary | ICD-10-CM

## 2017-11-29 DIAGNOSIS — Z8249 Family history of ischemic heart disease and other diseases of the circulatory system: Secondary | ICD-10-CM | POA: Insufficient documentation

## 2017-11-29 DIAGNOSIS — Z87891 Personal history of nicotine dependence: Secondary | ICD-10-CM | POA: Insufficient documentation

## 2017-11-29 DIAGNOSIS — Z825 Family history of asthma and other chronic lower respiratory diseases: Secondary | ICD-10-CM | POA: Insufficient documentation

## 2017-11-29 DIAGNOSIS — H3530 Unspecified macular degeneration: Secondary | ICD-10-CM | POA: Insufficient documentation

## 2017-11-29 DIAGNOSIS — I712 Thoracic aortic aneurysm, without rupture: Secondary | ICD-10-CM | POA: Diagnosis not present

## 2017-11-29 DIAGNOSIS — Z79899 Other long term (current) drug therapy: Secondary | ICD-10-CM | POA: Insufficient documentation

## 2017-11-29 DIAGNOSIS — N183 Chronic kidney disease, stage 3 unspecified: Secondary | ICD-10-CM

## 2017-11-29 DIAGNOSIS — I10 Essential (primary) hypertension: Secondary | ICD-10-CM | POA: Diagnosis not present

## 2017-11-29 DIAGNOSIS — I350 Nonrheumatic aortic (valve) stenosis: Secondary | ICD-10-CM | POA: Insufficient documentation

## 2017-11-29 DIAGNOSIS — E1122 Type 2 diabetes mellitus with diabetic chronic kidney disease: Secondary | ICD-10-CM | POA: Diagnosis not present

## 2017-11-29 DIAGNOSIS — G4733 Obstructive sleep apnea (adult) (pediatric): Secondary | ICD-10-CM | POA: Diagnosis not present

## 2017-11-29 DIAGNOSIS — Z7982 Long term (current) use of aspirin: Secondary | ICD-10-CM | POA: Insufficient documentation

## 2017-11-29 DIAGNOSIS — Z8261 Family history of arthritis: Secondary | ICD-10-CM | POA: Insufficient documentation

## 2017-11-29 DIAGNOSIS — R001 Bradycardia, unspecified: Secondary | ICD-10-CM | POA: Diagnosis not present

## 2017-11-29 DIAGNOSIS — I130 Hypertensive heart and chronic kidney disease with heart failure and stage 1 through stage 4 chronic kidney disease, or unspecified chronic kidney disease: Secondary | ICD-10-CM | POA: Insufficient documentation

## 2017-11-29 DIAGNOSIS — I2510 Atherosclerotic heart disease of native coronary artery without angina pectoris: Secondary | ICD-10-CM | POA: Insufficient documentation

## 2017-11-29 DIAGNOSIS — I480 Paroxysmal atrial fibrillation: Secondary | ICD-10-CM | POA: Diagnosis not present

## 2017-11-29 LAB — BASIC METABOLIC PANEL WITH GFR
Anion gap: 13 (ref 5–15)
BUN: 68 mg/dL — ABNORMAL HIGH (ref 6–20)
CO2: 27 mmol/L (ref 22–32)
Calcium: 9.4 mg/dL (ref 8.9–10.3)
Chloride: 96 mmol/L — ABNORMAL LOW (ref 101–111)
Creatinine, Ser: 2.52 mg/dL — ABNORMAL HIGH (ref 0.61–1.24)
GFR calc Af Amer: 28 mL/min — ABNORMAL LOW
GFR calc non Af Amer: 24 mL/min — ABNORMAL LOW
Glucose, Bld: 142 mg/dL — ABNORMAL HIGH (ref 65–99)
Potassium: 3.4 mmol/L — ABNORMAL LOW (ref 3.5–5.1)
Sodium: 136 mmol/L (ref 135–145)

## 2017-11-29 NOTE — Patient Instructions (Signed)
Labs today (will call for abnormal results, otherwise no news is good news)  Follow up as scheduled in 2 Months.  June Garage Code is 1300.

## 2017-12-01 IMAGING — DX DG CHEST 2V
2 series · 2 of 2 positions shown · non-contrast
Comparison: 05/21/2015

CLINICAL DATA: Chronic diastolic CHF, shortness of breath, fluid
build up for 3 months, hypertensive cardiomyopathy, type II diabetes
mellitus, hypertension, atrial fibrillation, former smoker

EXAM:
CHEST  2 VIEW

[w chest pa]
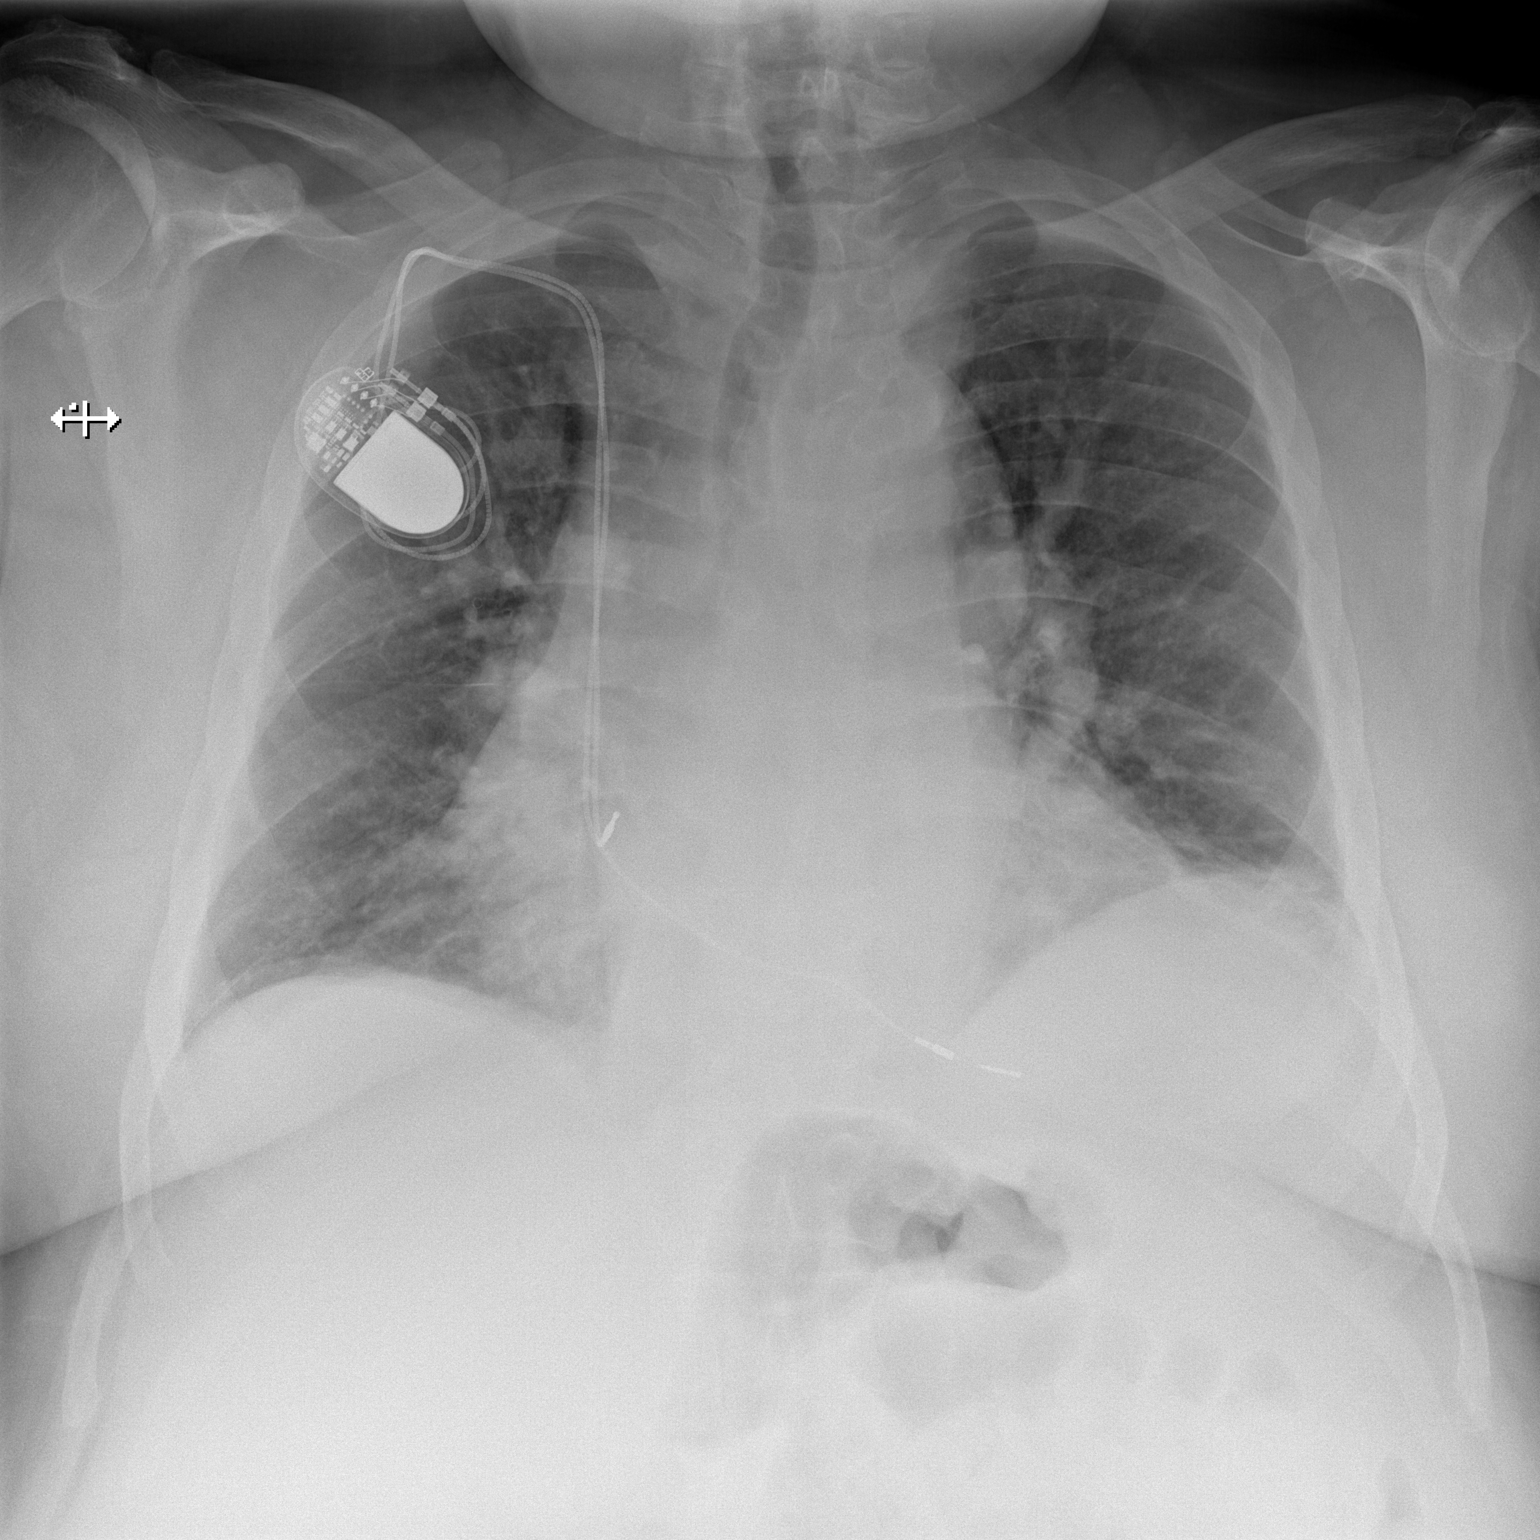

[w chest lat]
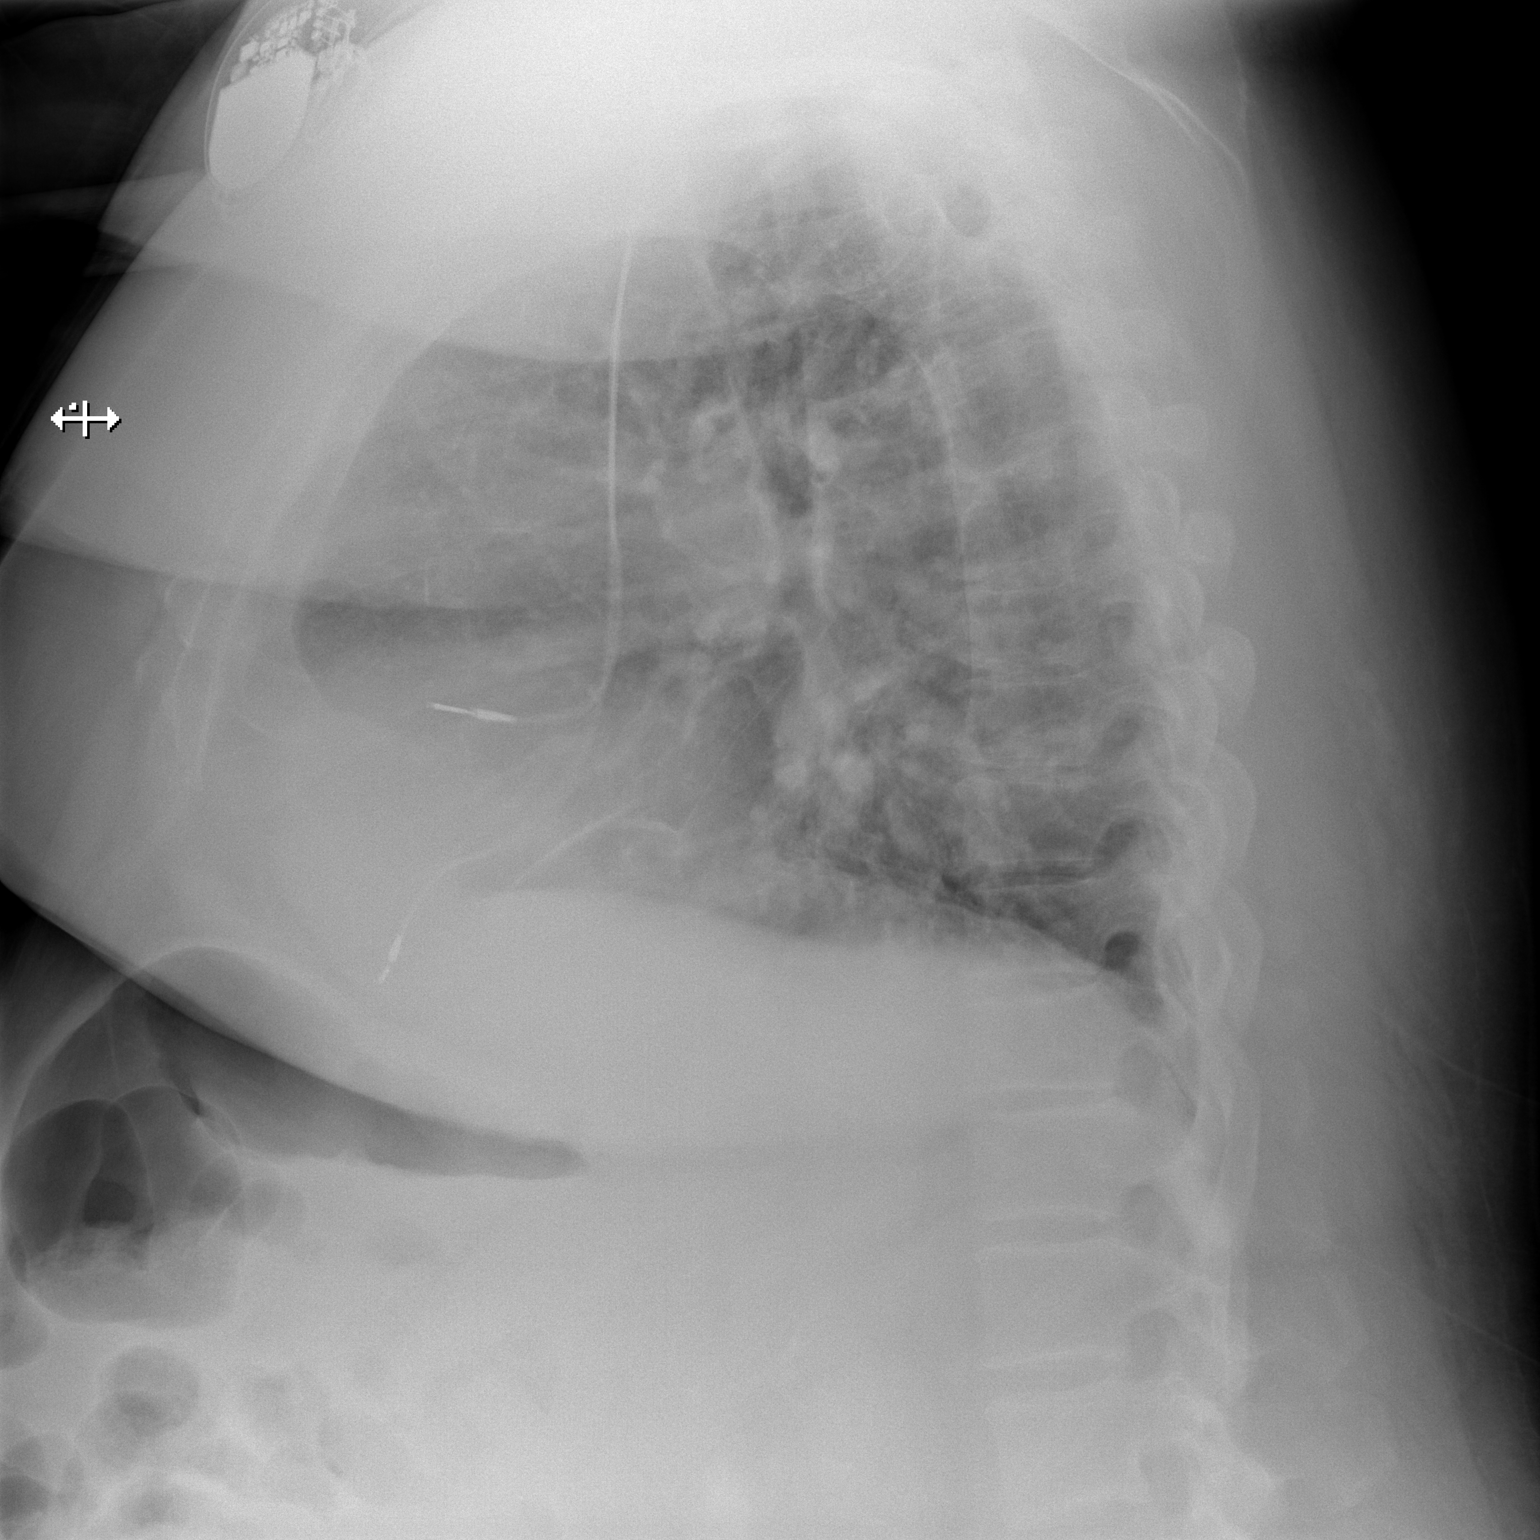

[2 of 2 positions shown; findings below may reference images not displayed]

FINDINGS: RIGHT subclavian transvenous pacemaker leads project at RIGHT atrium
RIGHT ventricle.

Enlargement of cardiac silhouette with pulmonary vascular
congestion.

Mediastinal contours stable.

Mild bibasilar atelectasis greater on LEFT.

No definite acute infiltrate, pleural effusion or pneumothorax.

Bones unremarkable.
IMPRESSION: Enlargement of cardiac silhouette with pulmonary vascular
congestion.

Bibasilar atelectasis.

## 2017-12-13 ENCOUNTER — Telehealth (HOSPITAL_COMMUNITY): Payer: Self-pay | Admitting: *Deleted

## 2017-12-13 ENCOUNTER — Other Ambulatory Visit (HOSPITAL_COMMUNITY): Payer: Medicare (Managed Care)

## 2017-12-13 NOTE — Telephone Encounter (Signed)
[facility] called and stated pt told them he was supposed to come here for labs today but he is with them and they are unable to get transportation to bring him here.  They state if we fax order over they will draw the labs.  Order for BMET faxed to them at [phone]

## 2017-12-29 ENCOUNTER — Other Ambulatory Visit (HOSPITAL_COMMUNITY): Payer: Self-pay | Admitting: Adult Health

## 2018-01-30 ENCOUNTER — Encounter (HOSPITAL_COMMUNITY): Payer: Self-pay

## 2018-01-30 ENCOUNTER — Ambulatory Visit (HOSPITAL_COMMUNITY)
Admission: RE | Admit: 2018-01-30 | Discharge: 2018-01-30 | Disposition: A | Discharge status: Home / Self Care | Payer: Medicare (Managed Care) | Source: Ambulatory Visit | Attending: Cardiology | Admitting: Cardiology

## 2018-01-30 VITALS — BP 128/86 | HR 93 | Wt 313.0 lb

## 2018-01-30 DIAGNOSIS — I712 Thoracic aortic aneurysm, without rupture: Secondary | ICD-10-CM | POA: Insufficient documentation

## 2018-01-30 DIAGNOSIS — H3530 Unspecified macular degeneration: Secondary | ICD-10-CM | POA: Diagnosis not present

## 2018-01-30 DIAGNOSIS — R001 Bradycardia, unspecified: Secondary | ICD-10-CM | POA: Insufficient documentation

## 2018-01-30 DIAGNOSIS — N183 Chronic kidney disease, stage 3 unspecified: Secondary | ICD-10-CM

## 2018-01-30 DIAGNOSIS — Z794 Long term (current) use of insulin: Secondary | ICD-10-CM | POA: Insufficient documentation

## 2018-01-30 DIAGNOSIS — I2510 Atherosclerotic heart disease of native coronary artery without angina pectoris: Secondary | ICD-10-CM | POA: Insufficient documentation

## 2018-01-30 DIAGNOSIS — Z899 Acquired absence of limb, unspecified: Secondary | ICD-10-CM | POA: Insufficient documentation

## 2018-01-30 DIAGNOSIS — E1122 Type 2 diabetes mellitus with diabetic chronic kidney disease: Secondary | ICD-10-CM | POA: Diagnosis not present

## 2018-01-30 DIAGNOSIS — H548 Legal blindness, as defined in USA: Secondary | ICD-10-CM | POA: Diagnosis not present

## 2018-01-30 DIAGNOSIS — I5032 Chronic diastolic (congestive) heart failure: Secondary | ICD-10-CM | POA: Insufficient documentation

## 2018-01-30 DIAGNOSIS — I480 Paroxysmal atrial fibrillation: Secondary | ICD-10-CM | POA: Diagnosis not present

## 2018-01-30 DIAGNOSIS — I350 Nonrheumatic aortic (valve) stenosis: Secondary | ICD-10-CM | POA: Diagnosis not present

## 2018-01-30 DIAGNOSIS — G4733 Obstructive sleep apnea (adult) (pediatric): Secondary | ICD-10-CM | POA: Diagnosis not present

## 2018-01-30 DIAGNOSIS — E1142 Type 2 diabetes mellitus with diabetic polyneuropathy: Secondary | ICD-10-CM | POA: Diagnosis not present

## 2018-01-30 DIAGNOSIS — Z7982 Long term (current) use of aspirin: Secondary | ICD-10-CM | POA: Insufficient documentation

## 2018-01-30 DIAGNOSIS — Z79899 Other long term (current) drug therapy: Secondary | ICD-10-CM | POA: Diagnosis not present

## 2018-01-30 DIAGNOSIS — I130 Hypertensive heart and chronic kidney disease with heart failure and stage 1 through stage 4 chronic kidney disease, or unspecified chronic kidney disease: Secondary | ICD-10-CM | POA: Diagnosis not present

## 2018-01-30 DIAGNOSIS — Z87891 Personal history of nicotine dependence: Secondary | ICD-10-CM | POA: Insufficient documentation

## 2018-01-30 LAB — BASIC METABOLIC PANEL
ANION GAP: 11 (ref 5–15)
BUN: 56 mg/dL — ABNORMAL HIGH (ref 6–20)
CALCIUM: 9.1 mg/dL (ref 8.9–10.3)
CO2: 29 mmol/L (ref 22–32)
Chloride: 98 mmol/L — ABNORMAL LOW (ref 101–111)
Creatinine, Ser: 2.29 mg/dL — ABNORMAL HIGH (ref 0.61–1.24)
GFR calc Af Amer: 32 mL/min — ABNORMAL LOW (ref >60)
GFR calc non Af Amer: 27 mL/min — ABNORMAL LOW (ref >60)
GLUCOSE: 173 mg/dL — AB (ref 65–99)
POTASSIUM: 3.6 mmol/L (ref 3.5–5.1)
Sodium: 138 mmol/L (ref 135–145)

## 2018-01-30 NOTE — Patient Instructions (Signed)
Routine lab work today. Will notify you of abnormal results, otherwise no news is good news!  No changes to medication at this time.  Follow up 2 months with Dr. Aundra Dubin and echocardiogram.  ___________________________________________________________________ [name] Code: 1500  Take all medication as prescribed the day of your appointment. Bring all medications with you to your appointment.  Do the following things EVERYDAY: 1) Weigh yourself in the morning before breakfast. Write it down and keep it in a log. 2) Take your medicines as prescribed 3) Eat low salt foods-Limit salt (sodium) to 2000 mg per day.  4) Stay as active as you can everyday 5) Limit all fluids for the day to less than 2 liters

## 2018-01-30 NOTE — Progress Notes (Signed)
Patient ID: [name], male   DOB: [birth year], 72 y.o.   MRN: [id number]   [facility]  PCP: [name] Cardiology: Dr. Sallyanne Kuster HF Cardiology: Dr. Aundra Dubin Nephrologist: Dr. Merlene Morse Pohle is a 72 y.o. 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   He was admitted most recently in 11/18 with acute respiratory failure => intubated.  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  He was treated for acute on chronic diastolic CHF with IV diuresis.  He has remained in NSR.  He returns for HF follow up. Overall feeling fine. SOB with exertion but doing ok. Denies PND/Orthopnea. Continues to use CPAP at night. Appetite ok. No fever or chills. He has a talking scale but does not use it. Ambulates with a walker. He doesnt move around much.  Taking all medications   10/18 showed EF 60-65% with moderate AS.   Labs (11/18): hgb 14.8, K 4.6, creatinine 2.34 Labs (08/17/2017): K 3.5 Creatinine 2.16  Labs (11/29/2017): K 3.4 Creatinine 2.52     PMH: 1. Chronic diastolic CHF with RV dysfunction: Echo (2/15) with EF 60%, mild LVH, mild aortic stenosis with mean gradient 18 mmHg, RV mildly dilated with mildly decreased systolic function.  - Echo (10/18): EF 60-65%, moderate aortic stenosis.  2. Aortic stenosis: Moderate on 10/18 echo.   3. Type II diabetes 4. 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  5. OSA: Uses CPAP.  6. Morbid obesity.  7. HTN 8. CAD: Nonobstructive.  [place] 2005 with 60% LAD stenosis. 9. 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  10. Atrial fibrillation: Paroxysmal.  None recently.  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  11. CKD stage III 12. Macular degeneration: Legally blind.  13. 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  14. PE: RLL in  2015 due to PPM lead thrombus and embolism. 15. S/p left foot amputation 16. Diabetic peripheral neuropathy.   Social History   Socioeconomic History  . Marital status: Single    Spouse name: Not on file  . Number of children: Y  . Years of education: Not on file  . Highest education level: Not on file  Occupational History  . Occupation: disabled  Social Needs  . Financial resource strain: Not on file  . Food insecurity:    Worry: Not on file    Inability: Not on file  . Transportation needs:    Medical: Not on file    Non-medical: Not on file  Tobacco Use  . Smoking status: Former Smoker    Packs/day: 0.00    Years: 0.00    Pack years: 0.00    Types: Cigarettes    Last attempt to quit: 08/17/2003    Years since quitting: 14.4  . Smokeless tobacco: Never Used  . Tobacco comment: started at age 63.  only smoked on occ- very rarely.0  Substance and Sexual Activity  . Alcohol use: Yes    Comment: 06/23/2013 "drank some; never had a problem w/it"  . Drug use: No  . Sexual activity: Never  Lifestyle  . Physical activity:    Days per week: Not on file    Minutes per session: Not on file  . Stress: Not on file  Relationships  . Social connections:    Talks on phone: Not on file    Gets together: Not  on file    Attends religious service: Not on file    Active member of club or organization: Not on file    Attends meetings of clubs or organizations: Not on file    Relationship status: Not on file  . Intimate partner violence:    Fear of current or ex partner: Not on file    Emotionally abused: Not on file    Physically abused: Not on file    Forced sexual activity: Not on file  Other Topics Concern  . Not on file  Social History Narrative  . Not on file    Family History  Problem Relation Age of Onset  . Coronary artery disease Sister   . Heart disease Sister   . Allergies Sister   . Asthma Brother   . Rheum arthritis Brother    Review of systems complete and  found to be negative unless listed in HPI.    Current Outpatient Medications  Medication Sig Dispense Refill  . acetaminophen (TYLENOL) 650 MG CR tablet Take 1,300 mg by mouth 2 (two) times daily.     Marland Kitchen albuterol (PROVENTIL HFA;VENTOLIN HFA) 108 (90 Base) MCG/ACT inhaler Inhale 2 puffs into the lungs every 6 (six) hours as needed for wheezing or shortness of breath.    Marland Kitchen amLODipine (NORVASC) 10 MG tablet Take 10 mg by mouth daily. Reported on 12/09/2015    . aspirin 325 MG tablet Take 325 mg by mouth daily.    Marland Kitchen atorvastatin (LIPITOR) 40 MG tablet Take 1 tablet (40 mg total) by mouth daily. 90 tablet 3  . cholecalciferol (VITAMIN D) 1000 units tablet Take 1,000 Units by mouth daily.    . citalopram (CELEXA) 20 MG tablet Take 20 mg by mouth daily.    . Febuxostat (ULORIC) 80 MG TABS Take 1 tablet by mouth daily.    Marland Kitchen gabapentin (NEURONTIN) 300 MG capsule Take 300 mg by mouth 2 (two) times daily.    . hydrALAZINE (APRESOLINE) 50 MG tablet Take 1 tablet (50 mg total) 3 (three) times daily by mouth. 90 tablet 0  . Insulin Glargine (TOUJEO SOLOSTAR) 300 UNIT/ML SOPN Inject 45 Units into the skin 2 (two) times daily.     Marland Kitchen lidocaine (LIDODERM) 5 % Place 1 patch onto the skin daily as needed (back pain). Remove & Discard patch within 12 hours or as directed by MD    . Liraglutide (VICTOZA) 18 MG/3ML SOPN Inject 1.8 mg into the skin daily.    . Melatonin 3 MG CAPS Take 1 capsule by mouth at bedtime as needed (sleep).     . Menthol, Topical Analgesic, (BIOFREEZE EX) Apply 1 application topically 3 (three) times daily as needed (pain). Reported on 10/27/2015    . methylcellulose (ARTIFICIAL TEARS) 1 % ophthalmic solution Place 1 drop into both eyes 2 (two) times daily as needed (dry eyes).     . metolazone (ZAROXOLYN) 2.5 MG tablet Take 1 tablet on Monday, Thursday, and Saturday mornings 20 tablet 11  . metoprolol succinate (TOPROL-XL) 50 MG 24 hr tablet Take 1 tablet (50 mg total) daily by mouth. Take with  or immediately following a meal. 30 tablet 0  . polyethylene glycol (MIRALAX / GLYCOLAX) packet Take 17 g by mouth daily as needed for moderate constipation.    . potassium chloride SA (K-DUR,KLOR-CON) 20 MEQ tablet Take 60 mEq (3 Tabs) in the AM and 40 mEq (2 Tabs) in the PM. 150 tablet 3  . ranitidine (ZANTAC) 150 MG tablet  Take 150 mg by mouth daily.     . sennosides-docusate sodium (SENOKOT-S) 8.6-50 MG tablet Take 1 tablet by mouth 2 (two) times daily.     . Skin Protectants, Misc. (EUCERIN) cream Apply 1 application topically 2 (two) times daily.    Marland Kitchen torsemide (DEMADEX) 100 MG tablet Take 100 mg by mouth 2 (two) times daily.     No current facility-administered medications for this encounter.    Facility-Administered Medications Ordered in Other Encounters  Medication Dose Route Frequency Provider Last Rate Last Dose  . chlorhexidine gluconate (MEDLINE KIT) (PERIDEX) 0.12 % solution 15 mL  15 mL Mouth Rinse BID Chesley Mires, MD      . MEDLINE mouth rinse  15 mL Mouth Rinse QID Chesley Mires, MD       Vitals:   01/30/18 0939  BP: 128/86  Pulse: 93  SpO2: 94%  Weight: (!) 313 lb (142 kg)    Wt Readings from Last 3 Encounters:  01/30/18 (!) 313 lb (142 kg)  11/29/17 (!) 310 lb 9.6 oz (140.9 kg)  11/09/17 (!) 322 lb (146.1 kg)    Physical Exam General:  No resp difficulty. Ambulated in the clinic with a rolling walker.  HEENT: normal Neck: supple. JVP 6-7. Carotids 2+ bilat; no bruits. No lymphadenopathy or thryomegaly appreciated. Cor: PMI nondisplaced. Regular rate & rhythm. No rubs, gallops . AS. Lungs: coarse throughout.  Abdomen: obese, soft, nontender, nondistended. No hepatosplenomegaly. No bruits or masses. Good bowel sounds. Extremities: no cyanosis, clubbing, rash, R and LLE trace edema Neuro: alert & orientedx3, cranial nerves grossly intact. moves all 4 extremities w/o difficulty. Affect pleasant  Assessment/Plan: 1. Chronic diastolic CHF with prominent RV  failure: Echo in 10/18 with EF 60-65%.   -NYHA III. Volume status stable.  - Continue torsemide 100 mg BID - Continue metolazone 3 times a week. 2. Aortic stenosis: Echo in 10/18 with moderate AS.   Repeat ECHO.   3. Atrial fibrillation: Paroxysmal.   -Regular on exam - He is not anticoagulated (takes ASA) due to prior intracerebral hemorrhage.  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  - Stable.  4. CAD: Nonobstructive.   - No s/s of ischemia.    - Continue ASA and statin.   5. Bradycardia s/p Medtronic PPM  - Stable.  6. CKD: Stage III Check BMET today.  7. OSA:  -Continue CPAP. 8. Ascending aorta aneurysm: 4.0 cm on 11/18 CT chest. - Repeat imaging 06/2018 . Stable.    Follow up in 2 months with ECHO.    Darrick Grinder, NP  01/30/2018

## 2018-02-08 ENCOUNTER — Ambulatory Visit (INDEPENDENT_AMBULATORY_CARE_PROVIDER_SITE_OTHER): Payer: Medicare (Managed Care) | Admitting: Cardiology

## 2018-02-08 ENCOUNTER — Encounter: Payer: Self-pay | Admitting: Cardiology

## 2018-02-08 ENCOUNTER — Encounter (INDEPENDENT_AMBULATORY_CARE_PROVIDER_SITE_OTHER): Payer: Self-pay

## 2018-02-08 VITALS — BP 130/79 | HR 77 | Ht 71.5 in | Wt 318.4 lb

## 2018-02-08 DIAGNOSIS — I350 Nonrheumatic aortic (valve) stenosis: Secondary | ICD-10-CM

## 2018-02-08 DIAGNOSIS — I5032 Chronic diastolic (congestive) heart failure: Secondary | ICD-10-CM | POA: Diagnosis not present

## 2018-02-08 DIAGNOSIS — I495 Sick sinus syndrome: Secondary | ICD-10-CM | POA: Diagnosis not present

## 2018-02-08 DIAGNOSIS — I480 Paroxysmal atrial fibrillation: Secondary | ICD-10-CM | POA: Diagnosis not present

## 2018-02-08 LAB — CUP PACEART INCLINIC DEVICE CHECK
Brady Statistic AP VP Percent: 18 %
Brady Statistic AS VS Percent: 56 %
Date Time Interrogation Session: 20190626141353
Implantable Lead Implant Date: 20050310
Implantable Lead Location: 753859
Lead Channel Pacing Threshold Amplitude: 2.75 V
Lead Channel Pacing Threshold Pulse Width: 0.4 ms
Lead Channel Pacing Threshold Pulse Width: 1 ms
Lead Channel Sensing Intrinsic Amplitude: 15.67 mV
Lead Channel Setting Pacing Amplitude: 2 V
Lead Channel Setting Pacing Pulse Width: 1 ms
Lead Channel Setting Sensing Sensitivity: 4 mV
MDC IDC LEAD IMPLANT DT: 20050301
MDC IDC LEAD LOCATION: 753860
MDC IDC MSMT BATTERY IMPEDANCE: 514 Ohm
MDC IDC MSMT BATTERY REMAINING LONGEVITY: 63 mo
MDC IDC MSMT BATTERY VOLTAGE: 2.78 V
MDC IDC MSMT LEADCHNL RA IMPEDANCE VALUE: 435 Ohm
MDC IDC MSMT LEADCHNL RA PACING THRESHOLD AMPLITUDE: 0.625 V
MDC IDC MSMT LEADCHNL RA SENSING INTR AMPL: 1.4 mV
MDC IDC MSMT LEADCHNL RV IMPEDANCE VALUE: 634 Ohm
MDC IDC PG IMPLANT DT: 20140321
MDC IDC SET LEADCHNL RV PACING AMPLITUDE: 4 V
MDC IDC STAT BRADY AP VS PERCENT: 4 %
MDC IDC STAT BRADY AS VP PERCENT: 22 %

## 2018-02-08 NOTE — Progress Notes (Signed)
Electrophysiology Office Note   Date:  02/08/2018   ID:  [name], DOB Nov 26, 1945, MRN [id number]  PCP:  [name], MD  Cardiologist:  Aundra Dubin Primary Electrophysiologist:  Tiamarie Furnari Meredith Leeds, MD    Chief Complaint  Patient presents with  . Advice Only    PAT     History of Present Illness: [name] is a 72 y.o. male who is being seen today for the evaluation of PAT at the request of [name]. Presenting today for electrophysiology evaluation.  He has a history of chronic diastolic heart failure with prominent RV failure, moderate aortic stenosis, paroxysmal atrial fibrillation, nonobstructive coronary disease, bradycardia status post Medtronic pacemaker, CKD stage III, OSA, and an ascending aortic aneurysm.  He was admitted 11/18 with acute respiratory failure and was intubated.  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  He was treated for acute on chronic diastolic heart failure with IV diuresis and remained in sinus rhythm.  Today, he denies symptoms of chest pain, shortness of breath, orthopnea, PND, lower extremity edema, claudication, dizziness, presyncope, syncope, bleeding, or neurologic sequela. The patient is tolerating medications without difficulties.  Today, he is continuing to feel mildly weak and fatigued.  He is in atrial tachycardia versus atrial flutter based on device interrogation.  He does not have any other major complaints today.  He says that he is feeling normal per his usual symptoms.   Past Medical History:  Diagnosis Date  . Arthritis    "all over" (06/23/2013)  . Atrial fibrillation ([place])    not Candidate for Coumadin d/t rectal bleeding  . Benign prostatic hypertrophy   . Cardiomyopathy, hypertensive ([place])     diastolic dysfunction by 4098 echo  . Coronary artery disease     nonobstructive. 2 vessel. cardiac catheter in March 2005 showing 60% LAD occlusion, 30% circumflex occlusion.  .  Degenerative joint disease     Right knee.  . Diabetes type 2, uncontrolled ([place])   . Diabetic peripheral neuropathy ([place])   . Diabetic ulcer of thigh ([place])    H/O Rt thigh ulcer.  . Exertional shortness of breath    "comes and goes" (06/23/2013)  . GERD (gastroesophageal reflux disease)   . Gout   . Herpes   . Hyperlipidemia   . Hypertension   . Hypotension 09/05/2013  . Incarcerated ventral hernia    H/O. S/P repair ventral hernia repair 07/2005.  Marland Kitchen LBBB (left bundle branch block)   . Legally blind   . Morbid obesity ([place])   . Obstructive sleep apnea    On CPAP. Last sleep study (06/2009) - Severe obstructive sleep apnea/hypopnea syndrome, apnea-hypopnea index 70.5 per hour with non positional events moderately loud snoring and oxygen desaturation to a nadir of 85% on room air.  . Occult blood positive stool     History of in August 2007.  colonoscopy in January 2008 showing normal colon with fair inadequate prep. By Dr. Silvano Rusk.  . 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 Sepsis ([place]) 08/26/2013   Past Surgical History:  Procedure Laterality Date  . AMPUTATION Left 06/22/2013   Procedure: AMPUTATION FOOT;  Surgeon: Newt Minion, MD;  Location: [facility];  Service: Orthopedics;  Laterality: Left;  Left Midfoot Amputation  . CARDIAC CATHETERIZATION  10/22/2003   Dilatation of the LV with preserved LV systolic function. Tortous aorta, but no AAA. Moderate disease with 50-60% area of narrowing in the circumflex  after the second OM and 70% mid narrowing in the LAD. No intervention.  Marland Kitchen CARDIOVASCULAR STRESS TEST  01/23/2010   Perfusion defect seen in inferior myocardial region is consistent with diaphragmatic attenuation. Remaining myocardium demonstrates normal myocardial perfusion with no evidence of ischemia or infarct. Stress ECG was non-diagnostic due to LBBB.  Marland Kitchen CATARACT EXTRACTION, BILATERAL Bilateral   . ESOPHAGOGASTRODUODENOSCOPY N/A 10/12/2013   Procedure:  ESOPHAGOGASTRODUODENOSCOPY (EGD);  Surgeon: Gwenyth Ober, MD;  Location: [facility];  Service: General;  Laterality: N/A;  bedside  . EYE SURGERY    . 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 HERNIA REPAIR    . INSERT / REPLACE / REMOVE PACEMAKER  11/12/2003   Secondary to symptomatic bradycardia. DDDR pacemaker. By Dr. Rollene Fare.  Randolm Idol / REPLACE / REMOVE PACEMAKER  11/11/2012   "old pacemaker died; had new one put in" (07-15-2013)  . Pacemaker lead revision  12/2003    likely microdislodgment  of right ventricular lead  . PEG PLACEMENT N/A 10/12/2013   Procedure: PERCUTANEOUS ENDOSCOPIC GASTROSTOMY (PEG) PLACEMENT;  Surgeon: Gwenyth Ober, MD;  Location: [facility];  Service: General;  Laterality: N/A;  . PERMANENT PACEMAKER GENERATOR CHANGE N/A 11/03/2012   Procedure: PERMANENT PACEMAKER GENERATOR CHANGE;  Surgeon: Sanda Klein, MD;  Location: [facility];  Service: Cardiovascular;  Laterality: N/A;  . REFRACTIVE SURGERY Bilateral   . TRANSTHORACIC ECHOCARDIOGRAM  01/01/2013   EF 50-55%, ventricular septum-thickness moderately increased  . VENOUS DOPPLER  01/17/2013   No evidence of thrombus or thrombophlebitis. Left GSVs appear enlarged and demonstrate valvular insuffiiciency. Bilateral SSVs appeared patent with no valvular insufficiency seen.  Marland Kitchen VENTRAL HERNIA REPAIR  07/2005    S/P laparotomy, lysis of adhesions, repair of incarcerated ventral hernia with  partial omentectomy.. Performed by Dr. Marlou Starks.     Current Outpatient Medications  Medication Sig Dispense Refill  . acetaminophen (TYLENOL) 650 MG CR tablet Take 1,300 mg by mouth 2 (two) times daily.     Marland Kitchen albuterol (PROVENTIL HFA;VENTOLIN HFA) 108 (90 Base) MCG/ACT inhaler Inhale 2 puffs into the lungs every 6 (six) hours as needed for wheezing or shortness of breath.    Marland Kitchen amLODipine (NORVASC) 10 MG tablet Take 10 mg by mouth daily. Reported on 12/09/2015    . aspirin 325 MG tablet Take 325 mg  by mouth daily.    Marland Kitchen atorvastatin (LIPITOR) 40 MG tablet Take 1 tablet (40 mg total) by mouth daily. 90 tablet 3  . cholecalciferol (VITAMIN D) 1000 units tablet Take 1,000 Units by mouth daily.    . citalopram (CELEXA) 20 MG tablet Take 20 mg by mouth daily.    . Febuxostat (ULORIC) 80 MG TABS Take 1 tablet by mouth daily.    Marland Kitchen gabapentin (NEURONTIN) 300 MG capsule Take 300 mg by mouth 2 (two) times daily.    . hydrALAZINE (APRESOLINE) 50 MG tablet Take 1 tablet (50 mg total) 3 (three) times daily by mouth. 90 tablet 0  . Insulin Glargine (TOUJEO SOLOSTAR) 300 UNIT/ML SOPN Inject 45 Units into the skin 2 (two) times daily.     Marland Kitchen lidocaine (LIDODERM) 5 % Place 1 patch onto the skin daily as needed (back pain). Remove & Discard patch within 12 hours or as directed by MD    . Liraglutide (VICTOZA) 18 MG/3ML SOPN Inject 1.8 mg into the skin daily.    . Melatonin 3 MG CAPS Take 1 capsule by mouth at bedtime as needed (sleep).     Marland Kitchen  Menthol, Topical Analgesic, (BIOFREEZE EX) Apply 1 application topically 3 (three) times daily as needed (pain). Reported on 10/27/2015    . methylcellulose (ARTIFICIAL TEARS) 1 % ophthalmic solution Place 1 drop into both eyes 2 (two) times daily as needed (dry eyes).     . metolazone (ZAROXOLYN) 2.5 MG tablet Take 1 tablet on Monday, Thursday, and Saturday mornings 20 tablet 11  . metoprolol succinate (TOPROL-XL) 50 MG 24 hr tablet Take 1 tablet (50 mg total) daily by mouth. Take with or immediately following a meal. 30 tablet 0  . polyethylene glycol (MIRALAX / GLYCOLAX) packet Take 17 g by mouth daily as needed for moderate constipation.    . potassium chloride SA (K-DUR,KLOR-CON) 20 MEQ tablet Take 60 mEq (3 Tabs) in the AM and 40 mEq (2 Tabs) in the PM. 150 tablet 3  . ranitidine (ZANTAC) 150 MG tablet Take 150 mg by mouth daily.     . sennosides-docusate sodium (SENOKOT-S) 8.6-50 MG tablet Take 1 tablet by mouth 2 (two) times daily.     . Skin Protectants, Misc.  (EUCERIN) cream Apply 1 application topically 2 (two) times daily.    Marland Kitchen torsemide (DEMADEX) 100 MG tablet Take 100 mg by mouth 2 (two) times daily.     No current facility-administered medications for this visit.    Facility-Administered Medications Ordered in Other Visits  Medication Dose Route Frequency Provider Last Rate Last Dose  . chlorhexidine gluconate (MEDLINE KIT) (PERIDEX) 0.12 % solution 15 mL  15 mL Mouth Rinse BID Chesley Mires, MD      . MEDLINE mouth rinse  15 mL Mouth Rinse QID Chesley Mires, MD        Allergies:   Patient has no known allergies.   Social History:  The patient  reports that he quit smoking about 14 years ago. His smoking use included cigarettes. He smoked 0.00 packs per day for 0.00 years. He has never used smokeless tobacco. He reports that he drinks alcohol. He reports that he does not use drugs.   Family History:  The patient's family history includes Allergies in his sister; Asthma in his brother; Coronary artery disease in his sister; Heart disease in his sister; Rheum arthritis in his brother.    ROS:  Please see the history of present illness.   Otherwise, review of systems is positive for palpitations, visual changes.   All other systems are reviewed and negative.    PHYSICAL EXAM: VS:  BP 130/79   Pulse 77   Ht 5' 11.5" (1.816 m)   Wt (!) 318 lb 6.4 oz (144.4 kg)   SpO2 93%   BMI 43.79 kg/m  , BMI Body mass index is 43.79 kg/m. GEN: Well nourished, well developed, in no acute distress  HEENT: normal  Neck: no JVD, carotid bruits, or masses Cardiac: RRR; no murmurs, rubs, or gallops,no edema  Respiratory:  clear to auscultation bilaterally, normal work of breathing GI: soft, nontender, nondistended, + BS MS: no deformity or atrophy  Skin: warm and dry, device pocket is well healed Neuro:  Strength and sensation are intact Psych: euthymic mood, full affect  EKG:  EKG is not ordered today. Personal review of the ekg ordered shows paced,  anteroseptal MI, LVH by voltage  Device interrogation is reviewed today in detail.  See PaceArt for details.   Recent Labs: 08/15/2017: B Natriuretic Peptide 57.2; Magnesium 2.1 08/16/2017: ALT 26 08/17/2017: Hemoglobin 13.6; Platelets 156 01/30/2018: BUN 56; Creatinine, Ser 2.29; Potassium 3.6; Sodium 138  Lipid Panel     Component Value Date/Time   CHOL 164 07/27/2017 1439   CHOL 129 01/05/2017 1235   TRIG 179 (H) 07/27/2017 1439   HDL 38 (L) 07/27/2017 1439   HDL 36 (L) 01/05/2017 1235   CHOLHDL 4.3 07/27/2017 1439   VLDL 36 07/27/2017 1439   LDLCALC 90 07/27/2017 1439   LDLCALC 71 01/05/2017 1235     Wt Readings from Last 3 Encounters:  02/08/18 (!) 318 lb 6.4 oz (144.4 kg)  01/30/18 (!) 313 lb (142 kg)  11/29/17 (!) 310 lb 9.6 oz (140.9 kg)      Other studies Reviewed: Additional studies/ records that were reviewed today include: TTE 06/14/17  Review of the above records today demonstrates:  - Left ventricle: The cavity size was normal. Wall thickness was   increased in a pattern of moderate LVH. Systolic function was   normal. The estimated ejection fraction was in the range of 60%   to 65%. Left ventricular diastolic function parameters were   normal. - Aortic valve: There was moderate stenosis. Valve area (VTI): 1.7   cm^2. Valve area (Vmax): 2 cm^2. Valve area (Vmean): 1.86 cm^2. - Atrial septum: No defect or patent foramen ovale was identified.   ASSESSMENT AND PLAN:  1.  Paroxysmal atrial fibrillation/atrial tachycardia: Had previously been on amiodarone, but this was stopped due to lung toxicity.  Is not anticoagulated due to prior intracranial hemorrhage.  It appears that he is having some atrial fibrillation, but due to his chronic medical conditions, heart failure, and renal disease, or medication options are limited.  I also do not feel that he is a very good candidate for ablation.  We Akari Defelice discuss this with his primary cardiologist  This patients  CHA2DS2-VASc Score and unadjusted Ischemic Stroke Rate (% per year) is equal to 3.2 % stroke rate/year from a score of 3  Above score calculated as 1 point each if present [CHF, HTN, DM, Vascular=MI/PAD/Aortic Plaque, Age if 65-74, or Male] Above score calculated as 2 points each if present [Age > 75, or Stroke/TIA/TE]  2.  Chronic diastolic heart failure: Followed in heart failure clinic.  No obvious signs of volume overload  3.  Moderate aortic stenosis: Plan for repeat echo.  4.  Bradycardia: Status post Medtronic pacemaker.  Device functioning appropriately with stable lead parameters.  No changes.  Current medicines are reviewed at length with the patient today.   The patient does not have concerns regarding his medicines.  The following changes were made today:  none  Labs/ tests ordered today include:  No orders of the defined types were placed in this encounter.    Disposition:   FU with Teralyn Mullins pending discussion with primary cardiology  Signed, Azreal Stthomas Meredith Leeds, MD  02/08/2018 11:14 AM     [facility] [address] [place]  [address] [phone] (office) [phone] (fax)

## 2018-02-08 NOTE — Patient Instructions (Signed)
Medication Instructions:  Your physician recommends that you continue on your current medications as directed. Please refer to the Current Medication list given to you today.  Labwork: None ordered  Testing/Procedures: None ordered  Follow-Up: To be determined.  Dr. Curt Bears is going to discuss your treatment plan with Dr. Sallyanne Kuster.   * If you need a refill on your cardiac medications before your next appointment, please call your pharmacy.   *Please note that any paperwork needing to be filled out by the provider will need to be addressed at the front desk prior to seeing the provider. Please note that any FMLA, disability or other documents regarding health condition is subject to a $25.00 charge that must be received prior to completion of paperwork in the form of a money order or check.  Thank you for choosing [facility]!!   [name], RN [phone]  Any Other Special Instructions Will Be Listed Below (If Applicable).

## 2018-02-17 LAB — CUP PACEART INCLINIC DEVICE CHECK
Implantable Lead Implant Date: 20050301
Implantable Lead Location: 753859
Implantable Lead Model: 5092
Implantable Lead Model: 5594
Lead Channel Setting Pacing Pulse Width: 1 ms
Lead Channel Setting Sensing Sensitivity: 5.6 mV
MDC IDC LEAD IMPLANT DT: 20050310
MDC IDC LEAD LOCATION: 753860
MDC IDC PG IMPLANT DT: 20140321
MDC IDC SESS DTM: 20190705141015
MDC IDC SET LEADCHNL RA PACING AMPLITUDE: 2 V
MDC IDC SET LEADCHNL RV PACING AMPLITUDE: 4 V

## 2018-02-17 NOTE — Progress Notes (Signed)
Informed pt that no further follow up w/ Dr. Curt Bears is needed at this time.  Pt appreciates the update.

## 2018-03-21 ENCOUNTER — Ambulatory Visit (HOSPITAL_BASED_OUTPATIENT_CLINIC_OR_DEPARTMENT_OTHER)
Admission: RE | Admit: 2018-03-21 | Discharge: 2018-03-21 | Disposition: A | Discharge status: Home / Self Care | Payer: Medicare (Managed Care) | Source: Ambulatory Visit | Attending: Cardiology | Admitting: Cardiology

## 2018-03-21 ENCOUNTER — Ambulatory Visit (HOSPITAL_COMMUNITY)
Admission: RE | Admit: 2018-03-21 | Discharge: 2018-03-21 | Disposition: A | Discharge status: Home / Self Care | Payer: Medicare (Managed Care) | Source: Ambulatory Visit | Attending: Cardiology | Admitting: Cardiology

## 2018-03-21 VITALS — BP 141/72 | HR 72 | Wt 326.6 lb

## 2018-03-21 DIAGNOSIS — I5032 Chronic diastolic (congestive) heart failure: Secondary | ICD-10-CM | POA: Diagnosis not present

## 2018-03-21 DIAGNOSIS — Z794 Long term (current) use of insulin: Secondary | ICD-10-CM | POA: Insufficient documentation

## 2018-03-21 DIAGNOSIS — G4733 Obstructive sleep apnea (adult) (pediatric): Secondary | ICD-10-CM | POA: Insufficient documentation

## 2018-03-21 DIAGNOSIS — R001 Bradycardia, unspecified: Secondary | ICD-10-CM | POA: Insufficient documentation

## 2018-03-21 DIAGNOSIS — H3530 Unspecified macular degeneration: Secondary | ICD-10-CM | POA: Diagnosis not present

## 2018-03-21 DIAGNOSIS — I130 Hypertensive heart and chronic kidney disease with heart failure and stage 1 through stage 4 chronic kidney disease, or unspecified chronic kidney disease: Secondary | ICD-10-CM | POA: Insufficient documentation

## 2018-03-21 DIAGNOSIS — Z7982 Long term (current) use of aspirin: Secondary | ICD-10-CM | POA: Insufficient documentation

## 2018-03-21 DIAGNOSIS — I350 Nonrheumatic aortic (valve) stenosis: Secondary | ICD-10-CM

## 2018-03-21 DIAGNOSIS — I712 Thoracic aortic aneurysm, without rupture: Secondary | ICD-10-CM | POA: Diagnosis not present

## 2018-03-21 DIAGNOSIS — Z79899 Other long term (current) drug therapy: Secondary | ICD-10-CM | POA: Insufficient documentation

## 2018-03-21 DIAGNOSIS — N183 Chronic kidney disease, stage 3 (moderate): Secondary | ICD-10-CM | POA: Insufficient documentation

## 2018-03-21 DIAGNOSIS — I480 Paroxysmal atrial fibrillation: Secondary | ICD-10-CM | POA: Diagnosis not present

## 2018-03-21 DIAGNOSIS — Z87891 Personal history of nicotine dependence: Secondary | ICD-10-CM | POA: Diagnosis not present

## 2018-03-21 DIAGNOSIS — I2510 Atherosclerotic heart disease of native coronary artery without angina pectoris: Secondary | ICD-10-CM | POA: Insufficient documentation

## 2018-03-21 DIAGNOSIS — Z6841 Body Mass Index (BMI) 40.0 and over, adult: Secondary | ICD-10-CM | POA: Diagnosis not present

## 2018-03-21 DIAGNOSIS — E1122 Type 2 diabetes mellitus with diabetic chronic kidney disease: Secondary | ICD-10-CM | POA: Insufficient documentation

## 2018-03-21 LAB — LIPID PANEL
CHOL/HDL RATIO: 3.5 ratio
CHOLESTEROL: 115 mg/dL (ref 0–200)
HDL: 33 mg/dL — AB (ref >40)
LDL Cholesterol: 66 mg/dL (ref 0–99)
TRIGLYCERIDES: 80 mg/dL (ref <150)
VLDL: 16 mg/dL (ref 0–40)

## 2018-03-21 LAB — BASIC METABOLIC PANEL
ANION GAP: 13 (ref 5–15)
BUN: 49 mg/dL — AB (ref 8–23)
CO2: 29 mmol/L (ref 22–32)
Calcium: 9.3 mg/dL (ref 8.9–10.3)
Chloride: 96 mmol/L — ABNORMAL LOW (ref 98–111)
Creatinine, Ser: 2.04 mg/dL — ABNORMAL HIGH (ref 0.61–1.24)
GFR, EST AFRICAN AMERICAN: 37 mL/min — AB (ref >60)
GFR, EST NON AFRICAN AMERICAN: 32 mL/min — AB (ref >60)
Glucose, Bld: 181 mg/dL — ABNORMAL HIGH (ref 70–99)
POTASSIUM: 4.1 mmol/L (ref 3.5–5.1)
SODIUM: 138 mmol/L (ref 135–145)

## 2018-03-21 MED ORDER — METOLAZONE 2.5 MG PO TABS: ORAL_TABLET | ORAL | 11 refills | Status: DC

## 2018-03-21 MED ORDER — METOPROLOL SUCCINATE ER 50 MG PO TB24: 50.0000 mg | ORAL_TABLET | Freq: Every day | ORAL | 3 refills | Status: DC

## 2018-03-21 NOTE — Progress Notes (Signed)
  Echocardiogram 2D Echocardiogram has been performed.  [name] 03/21/2018, 9:51 AM

## 2018-03-21 NOTE — Progress Notes (Signed)
Patient ID: [name], male   DOB: 08/28/1945, 72 y.o.   MRN: [id number]   [facility]ACE  PCP: [name] Cardiology: Dr. Sallyanne Kuster HF Cardiology: Dr. Aundra Dubin Nephrologist: Dr. Merlene Morse Kauth is a 72 y.o. 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   He was admitted in 11/18 with acute respiratory failure => intubated.  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  He was treated for acute on chronic diastolic CHF with IV diuresis.    He returns for followup of CHF.  Breathing is "up and down."  He chronically gets short of breath walking from one room to another in his house, uses a walker.  No chest pain.  No lightheadedness.  Cannot lie on his back (lies on side).  Weight is up 13 lbs.  He is currently taking metolazone three times a week.  He is in rate-controlled atrial fibrillation today.   ECG: atrial fibrillation, left axis deviation.   Labs (11/18): hgb 14.8, K 4.6, creatinine 2.34 Labs (08/17/2017): K 3.5 Creatinine 2.16  Labs (11/29/2017): K 3.4 Creatinine 2.52  Labs (6/19): K 3.6, creatinine 2.29  PMH: 1. Chronic diastolic CHF with RV dysfunction: Echo (2/15) with EF 60%, mild LVH, mild aortic stenosis with mean gradient 18 mmHg, RV mildly dilated with mildly decreased systolic function.  - Echo (10/18): EF 60-65%, moderate aortic stenosis.  - Echo (8/19): EF 60-65%, mild LVH, moderate AS with mean gradient 29 mmHg/AVA 1.1 cm^2, PASP 45, normal RV size with mildly decreased systolic function.  2. Aortic stenosis: Moderate on 8/19 echo.   3. Type II diabetes 4. 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  - 3.8 cm ascending aorta on 8/19 echo.  5. OSA: Uses CPAP.  6. Morbid obesity.  7. HTN 8. CAD: Nonobstructive.  [place] 2005 with 60% LAD stenosis. 9. Symptomatic  sinus bradycardia requiring Medtronic dual chamber PPM.  10. Atrial fibrillation: Paroxysmal.  None recently.  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  11. CKD stage III 12. Macular degeneration: Legally blind.  13. 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  14. 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 15. S/p left foot amputation 16. Diabetic peripheral neuropathy.   Social History   Socioeconomic History  . Marital status: Single    Spouse name: Not on file  . Number of children: Y  . Years of education: Not on file  . Highest education level: Not on file  Occupational History  . Occupation: disabled  Social Needs  . Financial resource strain: Not on file  . Food insecurity:    Worry: Not on file    Inability: Not on file  . Transportation needs:    Medical: Not on file    Non-medical: Not on file  Tobacco Use  . Smoking status: Former Smoker    Packs/day: 0.00    Years: 0.00    Pack years: 0.00    Types: Cigarettes    Last attempt to quit: 08/17/2003    Years since quitting: 14.6  . Smokeless tobacco: Never Used  . Tobacco comment: started at age 56.  only smoked on occ- very rarely.0  Substance and Sexual Activity  . Alcohol use: Yes    Comment: 06/23/2013 "drank some; never had a problem w/it"  . Drug use: No  . Sexual activity:  Never  Lifestyle  . Physical activity:    Days per week: Not on file    Minutes per session: Not on file  . Stress: Not on file  Relationships  . Social connections:    Talks on phone: Not on file    Gets together: Not on file    Attends religious service: Not on file    Active member of club or organization: Not on file    Attends meetings of clubs or organizations: Not on file    Relationship status: Not on file  . Intimate partner violence:    Fear of current or ex partner: Not on file    Emotionally abused: Not on file    Physically abused: Not on file    Forced sexual activity: Not on file  Other Topics Concern  .  Not on file  Social History Narrative  . Not on file    Family History  Problem Relation Age of Onset  . Coronary artery disease Sister   . Heart disease Sister   . Allergies Sister   . Asthma Brother   . Rheum arthritis Brother    Review of systems complete and found to be negative unless listed in HPI.    Current Outpatient Medications  Medication Sig Dispense Refill  . acetaminophen (TYLENOL) 650 MG CR tablet Take 1,300 mg by mouth 2 (two) times daily.     Marland Kitchen albuterol (PROVENTIL HFA;VENTOLIN HFA) 108 (90 Base) MCG/ACT inhaler Inhale 2 puffs into the lungs every 6 (six) hours as needed for wheezing or shortness of breath.    Marland Kitchen amLODipine (NORVASC) 10 MG tablet Take 10 mg by mouth daily. Reported on 12/09/2015    . aspirin 325 MG tablet Take 325 mg by mouth daily.    Marland Kitchen atorvastatin (LIPITOR) 40 MG tablet Take 1 tablet (40 mg total) by mouth daily. 90 tablet 3  . cholecalciferol (VITAMIN D) 1000 units tablet Take 1,000 Units by mouth daily.    . citalopram (CELEXA) 20 MG tablet Take 20 mg by mouth daily.    . Febuxostat (ULORIC) 80 MG TABS Take 1 tablet by mouth daily.    Marland Kitchen gabapentin (NEURONTIN) 300 MG capsule Take 300 mg by mouth 2 (two) times daily.    . hydrALAZINE (APRESOLINE) 50 MG tablet Take 1 tablet (50 mg total) 3 (three) times daily by mouth. 90 tablet 0  . Insulin Glargine (TOUJEO SOLOSTAR) 300 UNIT/ML SOPN Inject 45 Units into the skin 2 (two) times daily.     Marland Kitchen lidocaine (LIDODERM) 5 % Place 1 patch onto the skin daily as needed (back pain). Remove & Discard patch within 12 hours or as directed by MD    . Liraglutide (VICTOZA) 18 MG/3ML SOPN Inject 1.8 mg into the skin daily.    . Melatonin 3 MG CAPS Take 1 capsule by mouth at bedtime as needed (sleep).     . Menthol, Topical Analgesic, (BIOFREEZE EX) Apply 1 application topically 3 (three) times daily as needed (pain). Reported on 10/27/2015    . methylcellulose (ARTIFICIAL TEARS) 1 % ophthalmic solution Place 1 drop into  both eyes 2 (two) times daily as needed (dry eyes).     . metolazone (ZAROXOLYN) 2.5 MG tablet Take 1 tablet on Monday, Wednesday, Friday, and Saturday mornings.  Take 30 minutes prior to torsemide. 20 tablet 11  . polyethylene glycol (MIRALAX / GLYCOLAX) packet Take 17 g by mouth daily as needed for moderate constipation.    . potassium chloride  SA (K-DUR,KLOR-CON) 20 MEQ tablet Take 60 mEq (3 Tabs) in the AM and 40 mEq (2 Tabs) in the PM. 150 tablet 3  . ranitidine (ZANTAC) 150 MG tablet Take 150 mg by mouth daily.     . sennosides-docusate sodium (SENOKOT-S) 8.6-50 MG tablet Take 1 tablet by mouth 2 (two) times daily.     . Skin Protectants, Misc. (EUCERIN) cream Apply 1 application topically 2 (two) times daily.    Marland Kitchen torsemide (DEMADEX) 100 MG tablet Take 100 mg by mouth 2 (two) times daily.    . metoprolol succinate (TOPROL-XL) 50 MG 24 hr tablet Take 1 tablet (50 mg total) by mouth daily. Take with or immediately following a meal. 30 tablet 3   No current facility-administered medications for this encounter.    Facility-Administered Medications Ordered in Other Encounters  Medication Dose Route Frequency Provider Last Rate Last Dose  . chlorhexidine gluconate (MEDLINE KIT) (PERIDEX) 0.12 % solution 15 mL  15 mL Mouth Rinse BID Chesley Mires, MD      . MEDLINE mouth rinse  15 mL Mouth Rinse QID Chesley Mires, MD       Vitals:   03/21/18 0959  BP: (!) 141/72  Pulse: 72  SpO2: 96%  Weight: (!) 326 lb 9.6 oz (148.1 kg)    Wt Readings from Last 3 Encounters:  03/21/18 (!) 326 lb 9.6 oz (148.1 kg)  02/08/18 (!) 318 lb 6.4 oz (144.4 kg)  01/30/18 (!) 313 lb (142 kg)    Physical Exam General: NAD Neck: Thick, difficult JVP, no thyromegaly or thyroid nodule.  Lungs: Clear to auscultation bilaterally with normal respiratory effort. CV: Nondisplaced PMI.  Heart regular S1/S2, no S3/S4, 3/6 SEM RUSB with soft S2.  1+ edema to knees bilaterally, legs wrapped.  No carotid bruit.  Unable to  palpate pedal pulses.  Abdomen: Soft, nontender, no hepatosplenomegaly, no distention.  Skin: Intact without lesions or rashes.  Neurologic: Alert and oriented x 3.  Psych: Normal affect. Extremities: No clubbing or cyanosis.  HEENT: Normal.   Assessment/Plan: 1. Chronic diastolic CHF with prominent RV failure: Echo was done today and reviewed, EF 60-65%, moderate AS, PASP 45 mmHg, mildly decreased RV systolic function.  NYHA class IIIb symptoms but chronic. Weight is up, and I think that he is volume overloaded.   - Continue torsemide 100 mg BID - Increase metolazone to 4 times/week.   - BMET today and in 10 days.  - We discussed cutting back on sodium in his diet.  2. Aortic stenosis: Echo was done today and reviewed, moderate aortic stenosis.  Follow closely, would repeat study in 6 months as AS appears to be worsening and he has significant baseline dyspnea.  3. Atrial fibrillation: Paroxysmal. He is back in atrial fibrillation today.  He is not anticoagulated (takes ASA) due to prior intracerebral hemorrhage.  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  - As he is not anticoagulated, would continue rate control.  Cannot do DCCV without anticoagulation.  - Continue Toprol XL 50 mg daily.  4. CAD: Nonobstructive.  No chest pain.  - Continue ASA and statin.   5. 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 CKD: Stage III.  BMET today.  7. OSA: Continue CPAP. 8. Ascending aorta aneurysm: 4.0 cm on 11/18 CT chest.  8/19 echo with 3.8 cm ascending aorta.  Followup in 3 wks with APP given volume overload.    Loralie Champagne, MD  03/21/2018

## 2018-03-21 NOTE — Patient Instructions (Signed)
Labs today (will call for abnormal results, otherwise no news is good news)  Please make sure you're taking Toprol-XL 50 mg Once Daily.  If not please restart medication TODAY.  INCREASE metolazone to 2.5 mg 4days/week.  Please take 1 Tablet every Monday, Wednesday, Friday, and Saturday mornings.  Take 30 minutes prior to Torsemide.   Please follow a low sodium diet (less than 1800g of sodium/day)  Labs in 10 days (bmet)  Follow up in our [facility] in 3 weeks.

## 2018-03-23 ENCOUNTER — Telehealth (HOSPITAL_COMMUNITY): Payer: Self-pay | Admitting: *Deleted

## 2018-03-23 MED ORDER — METOLAZONE 2.5 MG PO TABS: 2.5000 mg | ORAL_TABLET | Freq: Every day | ORAL | 11 refills | Status: DC

## 2018-03-23 MED ORDER — TORSEMIDE 20 MG PO TABS: 120.0000 mg | ORAL_TABLET | Freq: Two times a day (BID) | ORAL | 3 refills | Status: DC

## 2018-03-23 NOTE — Telephone Encounter (Signed)
Spoke with Dr.Kohler he is aware and agreeable with plan.

## 2018-03-23 NOTE — Telephone Encounter (Signed)
If he has been taking metolazone daily, let's keep it that way and increase torsemide to 120 mg bid.  Need to make sure that paramedicine is getting his medication doses correct.

## 2018-03-23 NOTE — Telephone Encounter (Signed)
Dr.Kohler w/ [facility] left VM on [place] line. During office visit on 03/21/18 Dr.McLean increased metolazone to 4x weekly from 3x weekly but pt had actually been taking metolazone daily prior to his office visit. Dr.Kohler requests call back from DM on [phone] or [phone].   Message routed to [place]

## 2018-03-31 ENCOUNTER — Other Ambulatory Visit (HOSPITAL_COMMUNITY): Payer: Medicare (Managed Care)

## 2018-04-11 ENCOUNTER — Telehealth (HOSPITAL_COMMUNITY): Payer: Self-pay | Admitting: *Deleted

## 2018-04-11 ENCOUNTER — Ambulatory Visit (HOSPITAL_COMMUNITY)
Admission: RE | Admit: 2018-04-11 | Discharge: 2018-04-11 | Disposition: A | Discharge status: Home / Self Care | Payer: Medicare (Managed Care) | Source: Ambulatory Visit | Attending: Cardiology | Admitting: Cardiology

## 2018-04-11 VITALS — BP 124/70 | HR 87 | Wt 323.6 lb

## 2018-04-11 DIAGNOSIS — I480 Paroxysmal atrial fibrillation: Secondary | ICD-10-CM

## 2018-04-11 DIAGNOSIS — N183 Chronic kidney disease, stage 3 unspecified: Secondary | ICD-10-CM

## 2018-04-11 DIAGNOSIS — E1122 Type 2 diabetes mellitus with diabetic chronic kidney disease: Secondary | ICD-10-CM | POA: Insufficient documentation

## 2018-04-11 DIAGNOSIS — Z87891 Personal history of nicotine dependence: Secondary | ICD-10-CM | POA: Diagnosis not present

## 2018-04-11 DIAGNOSIS — I2510 Atherosclerotic heart disease of native coronary artery without angina pectoris: Secondary | ICD-10-CM | POA: Diagnosis not present

## 2018-04-11 DIAGNOSIS — I350 Nonrheumatic aortic (valve) stenosis: Secondary | ICD-10-CM | POA: Diagnosis not present

## 2018-04-11 DIAGNOSIS — Z794 Long term (current) use of insulin: Secondary | ICD-10-CM | POA: Diagnosis not present

## 2018-04-11 DIAGNOSIS — I130 Hypertensive heart and chronic kidney disease with heart failure and stage 1 through stage 4 chronic kidney disease, or unspecified chronic kidney disease: Secondary | ICD-10-CM | POA: Diagnosis not present

## 2018-04-11 DIAGNOSIS — Z79899 Other long term (current) drug therapy: Secondary | ICD-10-CM | POA: Insufficient documentation

## 2018-04-11 DIAGNOSIS — I712 Thoracic aortic aneurysm, without rupture: Secondary | ICD-10-CM | POA: Diagnosis not present

## 2018-04-11 DIAGNOSIS — Z7982 Long term (current) use of aspirin: Secondary | ICD-10-CM | POA: Diagnosis not present

## 2018-04-11 DIAGNOSIS — I10 Essential (primary) hypertension: Secondary | ICD-10-CM

## 2018-04-11 DIAGNOSIS — I5032 Chronic diastolic (congestive) heart failure: Secondary | ICD-10-CM

## 2018-04-11 DIAGNOSIS — G4733 Obstructive sleep apnea (adult) (pediatric): Secondary | ICD-10-CM | POA: Diagnosis not present

## 2018-04-11 DIAGNOSIS — R001 Bradycardia, unspecified: Secondary | ICD-10-CM | POA: Insufficient documentation

## 2018-04-11 LAB — BASIC METABOLIC PANEL
ANION GAP: 13 (ref 5–15)
BUN: 71 mg/dL — AB (ref 8–23)
CO2: 29 mmol/L (ref 22–32)
Calcium: 9.1 mg/dL (ref 8.9–10.3)
Chloride: 96 mmol/L — ABNORMAL LOW (ref 98–111)
Creatinine, Ser: 2.13 mg/dL — ABNORMAL HIGH (ref 0.61–1.24)
GFR calc Af Amer: 34 mL/min — ABNORMAL LOW (ref >60)
GFR calc non Af Amer: 30 mL/min — ABNORMAL LOW (ref >60)
GLUCOSE: 164 mg/dL — AB (ref 70–99)
POTASSIUM: 3.5 mmol/L (ref 3.5–5.1)
Sodium: 138 mmol/L (ref 135–145)

## 2018-04-11 MED ORDER — POTASSIUM CHLORIDE CRYS ER 20 MEQ PO TBCR: 60.0000 meq | EXTENDED_RELEASE_TABLET | Freq: Two times a day (BID) | ORAL | 11 refills | Status: DC

## 2018-04-11 NOTE — Telephone Encounter (Signed)
Result Notes for Basic Metabolic Panel (BMET)   Notes recorded by Darron Doom, RN on 04/11/2018 at 11:52 AM EDT Prescription faxed to [facility] who handles patient's medications. Faxed today to [phone]. MAR updated. ------  Notes recorded by Shirley Friar, PA-C on 04/11/2018 at 11:21 AM EDT Increase K to 60 meq BID *(currently taking 60 meq q am and 40 meq q pm)*  Thank you.

## 2018-04-11 NOTE — Progress Notes (Signed)
Patient ID: [name], male   DOB: 08/23/1945, 72 y.o.   MRN: [id number]   [facility]  PCP: Dr. Bradd Fletcher Cardiology: Dr. Sallyanne Fletcher HF Cardiology: Dr. Aundra Fletcher Nephrologist: Dr. Merlene Morse Fletcher is a 72 y.o. 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   He was admitted in 11/18 with acute respiratory failure => intubated.  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  He was treated for acute on chronic diastolic CHF with IV diuresis.    He presents today for follow up. At last visit metolazone increased, by per [facility] pt was taking daily. Instead, torsemide increased to 120 mg BID. His weight is down slightly, but he feels about the same. Has more good than bad days. States he and Dr. Joelyn Fletcher have briefly discussed HD before. He denies lightheadedness. He remains SOB walking around his house, from room to room. Uses a walker. He is taking metolazone daily. [facility] helps him with meds. He works out at [employer] facility 2-3 times weekly. HR stable today. He drinks 2-3 bottles of water each day, but little else.   Labs (11/18): hgb 14.8, K 4.6, creatinine 2.34 Labs (08/17/2017): K 3.5 Creatinine 2.16  Labs (11/29/2017): K 3.4 Creatinine 2.52  Labs (6/19): K 3.6, creatinine 2.29  PMH: 1. Chronic diastolic CHF with RV dysfunction: Echo (2/15) with EF 60%, mild LVH, mild aortic stenosis with mean gradient 18 mmHg, RV mildly dilated with mildly decreased systolic function.  - Echo (10/18): EF 60-65%, moderate aortic stenosis.  - Echo (8/19): EF 60-65%, mild LVH, moderate AS with mean gradient 29 mmHg/AVA 1.1 cm^2, PASP 45, normal RV size with mildly decreased systolic function.  2. Aortic stenosis: Moderate on 8/19 echo.   3. Type II diabetes 4. 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  - 3.8  cm ascending aorta on 8/19 echo.  5. OSA: Uses CPAP.  6. Morbid obesity.  7. HTN 8. CAD: Nonobstructive.  Arrey 2005 with 60% LAD stenosis. 9. 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  10. Atrial fibrillation: Paroxysmal.  None recently.  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  11. CKD stage III 12. Macular degeneration: Legally blind.  13. 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  14. 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 15. S/p left foot amputation 16. Diabetic peripheral neuropathy.   Social History   Socioeconomic History  . Marital status: Single    Spouse name: Not on file  . Number of children: Y  . Years of education: Not on file  . Highest education level: Not on file  Occupational History  . Occupation: disabled  Social Needs  . Financial resource strain: Not on file  . Food insecurity:    Worry: Not on file    Inability: Not on file  . Transportation needs:    Medical: Not on file    Non-medical: Not on file  Tobacco Use  . Smoking status: Former Smoker    Packs/day: 0.00    Years: 0.00    Pack years: 0.00    Types: Cigarettes    Last attempt to quit: 08/17/2003    Years since quitting: 14.6  . Smokeless tobacco: Never Used  . Tobacco comment: started at age 35.  only smoked on occ- very rarely.0  Substance and Sexual Activity  . Alcohol  use: Yes    Comment: 06/23/2013 "drank some; never had a problem w/it"  . Drug use: No  . Sexual activity: Never  Lifestyle  . Physical activity:    Days per week: Not on file    Minutes per session: Not on file  . Stress: Not on file  Relationships  . Social connections:    Talks on phone: Not on file    Gets together: Not on file    Attends religious service: Not on file    Active member of club or organization: Not on file    Attends meetings of clubs or organizations: Not on file    Relationship status: Not on file  . Intimate partner violence:    Fear of current or ex  partner: Not on file    Emotionally abused: Not on file    Physically abused: Not on file    Forced sexual activity: Not on file  Other Topics Concern  . Not on file  Social History Narrative  . Not on file    Family History  Problem Relation Age of Onset  . Coronary artery disease Sister   . Heart disease Sister   . Allergies Sister   . Asthma Brother   . Rheum arthritis Brother    Review of systems complete and found to be negative unless listed in HPI.    Current Outpatient Medications  Medication Sig Dispense Refill  . acetaminophen (TYLENOL) 650 MG CR tablet Take 1,300 mg by mouth 2 (two) times daily.     Marland Kitchen albuterol (PROVENTIL HFA;VENTOLIN HFA) 108 (90 Base) MCG/ACT inhaler Inhale 2 puffs into the lungs every 6 (six) hours as needed for wheezing or shortness of breath.    Marland Kitchen amLODipine (NORVASC) 10 MG tablet Take 10 mg by mouth daily. Reported on 12/09/2015    . aspirin 325 MG tablet Take 325 mg by mouth daily.    Marland Kitchen atorvastatin (LIPITOR) 40 MG tablet Take 1 tablet (40 mg total) by mouth daily. 90 tablet 3  . cholecalciferol (VITAMIN D) 1000 units tablet Take 1,000 Units by mouth daily.    . citalopram (CELEXA) 20 MG tablet Take 20 mg by mouth daily.    . Febuxostat (ULORIC) 80 MG TABS Take 1 tablet by mouth daily.    Marland Kitchen gabapentin (NEURONTIN) 300 MG capsule Take 300 mg by mouth 2 (two) times daily.    . hydrALAZINE (APRESOLINE) 50 MG tablet Take 1 tablet (50 mg total) 3 (three) times daily by mouth. 90 tablet 0  . Insulin Glargine (TOUJEO SOLOSTAR) 300 UNIT/ML SOPN Inject 45 Units into the skin 2 (two) times daily.     Marland Kitchen lidocaine (LIDODERM) 5 % Place 1 patch onto the skin daily as needed (back pain). Remove & Discard patch within 12 hours or as directed by MD    . Liraglutide (VICTOZA) 18 MG/3ML SOPN Inject 1.8 mg into the skin daily.    . Melatonin 3 MG CAPS Take 1 capsule by mouth at bedtime as needed (sleep).     . Menthol, Topical Analgesic, (BIOFREEZE EX) Apply 1  application topically 3 (three) times daily as needed (pain). Reported on 10/27/2015    . methylcellulose (ARTIFICIAL TEARS) 1 % ophthalmic solution Place 1 drop into both eyes 2 (two) times daily as needed (dry eyes).     . metolazone (ZAROXOLYN) 2.5 MG tablet Take 1 tablet (2.5 mg total) by mouth daily. 30 tablet 11  . metoprolol succinate (TOPROL-XL) 50 MG 24 hr tablet  Take 1 tablet (50 mg total) by mouth daily. Take with or immediately following a meal. 30 tablet 3  . polyethylene glycol (MIRALAX / GLYCOLAX) packet Take 17 g by mouth daily as needed for moderate constipation.    . potassium chloride SA (K-DUR,KLOR-CON) 20 MEQ tablet Take 60 mEq (3 Tabs) in the AM and 40 mEq (2 Tabs) in the PM. 150 tablet 3  . ranitidine (ZANTAC) 150 MG tablet Take 150 mg by mouth daily.     . sennosides-docusate sodium (SENOKOT-S) 8.6-50 MG tablet Take 1 tablet by mouth 2 (two) times daily.     . Skin Protectants, Misc. (EUCERIN) cream Apply 1 application topically 2 (two) times daily.    Marland Kitchen torsemide (DEMADEX) 20 MG tablet Take 6 tablets (120 mg total) by mouth 2 (two) times daily. 360 tablet 3   No current facility-administered medications for this encounter.    Facility-Administered Medications Ordered in Other Encounters  Medication Dose Route Frequency Provider Last Rate Last Dose  . chlorhexidine gluconate (MEDLINE KIT) (PERIDEX) 0.12 % solution 15 mL  15 mL Mouth Rinse BID Chesley Mires, MD      . MEDLINE mouth rinse  15 mL Mouth Rinse QID Chesley Mires, MD       Vitals:   04/11/18 0945  BP: 124/70  Pulse: 87  SpO2: 92%  Weight: (!) 146.8 kg (323 lb 9.6 oz)    Wt Readings from Last 3 Encounters:  04/11/18 (!) 146.8 kg (323 lb 9.6 oz)  03/21/18 (!) 148.1 kg (326 lb 9.6 oz)  02/08/18 (!) 144.4 kg (318 lb 6.4 oz)    Physical Exam General: NAD  HEENT: Normal Neck: Supple. JVP difficult, Carotids 2+ bilat; no bruits. No thyromegaly or nodule noted. Cor: PMI nondisplaced. RRR, 3/6 SEM RUSB with  soft S2. Lungs: CTAB, normal effort. Abdomen: Soft, non-tender, non-distended, no HSM. No bruits or masses. +BS  Extremities: No cyanosis, clubbing, or rash. Trace to 1+ edema. Unna boots in place. Neuro: Alert & orientedx3, cranial nerves grossly intact. moves all 4 extremities w/o difficulty. Affect pleasant   Assessment/Plan: 1. Chronic diastolic CHF with prominent RV failure:  - Echo 03/2018 EF 60-65%, moderate AS, PASP 45 mmHg, mildly decreased RV systolic function.  - NYHA III chronically - Volume status OK to mildly elevated on exam.   - Continue torsemide 120 mg BID - Continue daily metolazone 2.5 mg Reinforced fluid restriction to < 2 L daily, sodium restriction to less than 2000 mg daily, and the importance of daily weights.   2. Aortic stenosis:  - Moderate by Echo 03/2018  -  Follow closely, would repeat study in 6 months as AS appears to be worsening and he has significant baseline dyspnea.  3. Atrial fibrillation, chronic: - Paroxysmal. Remains in Afib.  He is not anticoagulated (takes ASA) due to prior intracerebral hemorrhage.  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  - As he is not anticoagulated, would continue rate control.  Cannot do DCCV without anticoagulation.  - Continue Toprol XL 50 mg daily.  4. CAD:  - Nonobstructive.   - No s/s of ischemia.    - Continue ASA and statin.   5. 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  6. CKD: Stage III.   - BMET today.   7. OSA:  - Continue nightly CPAP.  8. Ascending aorta aneurysm: - 4.0 cm on 11/18 CT chest.  8/19 echo with 3.8 cm ascending aorta. - No  change.   Doing OK with increase in metolazone. Unfortunately, he is now on max dose diuretics.  Will make sure he has follow up with Nephrology. Not clear how good of a candidate he is for HD.    Shirley Friar, PA-C  04/11/2018   Greater than 50% of the 25 minute visit was spent in counseling/coordination of care  regarding disease state education, salt/fluid restriction, sliding scale diuretics, and medication compliance.

## 2018-04-11 NOTE — Patient Instructions (Signed)
Labs today (will call for abnormal results, otherwise no news is good news)  Please follow up with Dr. Joelyn Oms at [facility] on September 26 @ 11:45 am.  Follow up in 2 months with Dr. Aundra Dubin.

## 2018-05-05 ENCOUNTER — Other Ambulatory Visit (HOSPITAL_COMMUNITY): Payer: Self-pay | Admitting: Cardiology

## 2018-05-10 ENCOUNTER — Encounter: Payer: Self-pay | Admitting: Cardiovascular Disease

## 2018-05-10 ENCOUNTER — Ambulatory Visit (INDEPENDENT_AMBULATORY_CARE_PROVIDER_SITE_OTHER): Payer: Medicare (Managed Care) | Admitting: Cardiovascular Disease

## 2018-05-10 VITALS — BP 140/82 | HR 76 | Ht 71.5 in | Wt 323.0 lb

## 2018-05-10 DIAGNOSIS — I2510 Atherosclerotic heart disease of native coronary artery without angina pectoris: Secondary | ICD-10-CM

## 2018-05-10 DIAGNOSIS — I495 Sick sinus syndrome: Secondary | ICD-10-CM | POA: Diagnosis not present

## 2018-05-10 DIAGNOSIS — I5032 Chronic diastolic (congestive) heart failure: Secondary | ICD-10-CM | POA: Diagnosis not present

## 2018-05-10 DIAGNOSIS — N183 Chronic kidney disease, stage 3 unspecified: Secondary | ICD-10-CM

## 2018-05-10 DIAGNOSIS — Z950 Presence of cardiac pacemaker: Secondary | ICD-10-CM | POA: Diagnosis not present

## 2018-05-10 DIAGNOSIS — I10 Essential (primary) hypertension: Secondary | ICD-10-CM

## 2018-05-10 DIAGNOSIS — I480 Paroxysmal atrial fibrillation: Secondary | ICD-10-CM

## 2018-05-10 DIAGNOSIS — E669 Obesity, unspecified: Secondary | ICD-10-CM

## 2018-05-10 DIAGNOSIS — E7800 Pure hypercholesterolemia, unspecified: Secondary | ICD-10-CM

## 2018-05-10 DIAGNOSIS — I350 Nonrheumatic aortic (valve) stenosis: Secondary | ICD-10-CM

## 2018-05-10 DIAGNOSIS — E1169 Type 2 diabetes mellitus with other specified complication: Secondary | ICD-10-CM

## 2018-05-10 DIAGNOSIS — I471 Supraventricular tachycardia: Secondary | ICD-10-CM

## 2018-05-10 DIAGNOSIS — G4733 Obstructive sleep apnea (adult) (pediatric): Secondary | ICD-10-CM

## 2018-05-10 NOTE — Patient Instructions (Addendum)
Medication Instructions: Dr Sallyanne Kuster recommends that you continue on your current medications as directed. Please refer to the Current Medication list given to you today.  Labwork: NONE ORDERED  Testing/Procedures: 1. Echocardiogram in 6 months - Your physician has requested that you have an echocardiogram. Echocardiography is a painless test that uses sound waves to create images of your heart. It provides your doctor with information about the size and shape of your heart and how well your heart's chambers and valves are working. This procedure takes approximately one hour. There are no restrictions for this procedure.  >> This will be performed at our [facility] location [place], [place] [address] [phone]  Follow-up: Dr Sallyanne Kuster recommends that you schedule a follow-up appointment in 6 months with a pacemaker check. You will receive a reminder letter in the mail two months in advance. If you don't receive a letter, please call our office to schedule the follow-up appointment.  If you need a refill on your cardiac medications before your next appointment, please call your pharmacy.

## 2018-05-10 NOTE — Progress Notes (Signed)
Patient ID: [name], male   DOB: 10/27/1945, 72 y.o.   MRN: [id number]    Cardiology Office Note    Date:  05/12/2018   ID:  [name], DOB Jul 13, 1946, MRN [id number]  PCP:  Ryan Adie, MD  Cardiologist:   Ryan Klein, MD   No chief complaint on file.   History of Present Illness:  [name] is a 72 y.o. male with long-standing diastolic heart failure, hypertensive heart disease, nonobstructive coronary artery disease by remote coronary angiography, moderate chronic kidney disease, obstructive sleep apnea, morbid obesity, type 2 diabetes mellitus complicated by blindness and severe peripheral neuropathy and recurrent poor wound healing, hyperlipidemia, hypertension who presents for pacemaker follow-up. It is important to note that he is blind and therefore requires assistance with transportation (uses [facility]) and has his medications prepacked in daily blister packs, prepared once weekly on Saturdays.  He is here for a pacemaker check and clinical follow-up.  He has not required hospitalization since December 2018.  He has been seen in the heart failure clinic at least every 2 months, most recently August 27.  He was seen in the EP clinic by Dr. Curt Fletcher in June.  He was felt to be a poor candidate for ablation.  He is not on anticoagulation due to previous intracranial hemorrhage. He also had a follow-up echo about a month ago.  There is little change compared to a year ago.  LVEF is 60-65%.  There is moderate aortic stenosis with a mean gradient of 29 mmHg, valve area around 1.1 cm.  Estimated PA pressure was 45 mmHg.  He was last in the hospital in November 2018 with acute hypoxic respiratory failure due to pulmonary edema, requiring intubation.  There was suspicion of amiodarone pulmonary toxicity since the infiltrates persisted following diuresis and the amio was stopped.  His ESR was 100.  He was given steroids.  Later, was hospitalized around [place]  for urinary tract infection complicated by dehydration, hypernatremia and asterixis.  His weight is monitored at [facility] and that is consistently under 330 pounds.  Today 323 pounds (virtually identical to his last appointment) and he is breathing comfortably.  His blood pressure is borderline high.  He remains morbidly obese.  He denies palpitations, dizziness, syncope, dyspnea at rest, chest pain, new focal neurological complaints.  He does not walk due to his blindness and very severe neuropathy of his feet.   His device was implanted for symptomatic sinus bradycardia in 2005, with a generator change in 2014.   Pacemaker interrogation today shows that he has 24% atrial pacing and 41% ventricular pacing.  There has been marked increase in the burden of ventricular pacing due to recurrent episodes of atrial tachycardia with atrial blanking and failure to mode switch.  Ventricular pacing thresholds are chronically moderately high.  Despite atrial tachycardia, high ventricular rates occur very infrequently.  Today he appears to be in atrial flutter or atrial tachycardia.  His ECG shows occasional fused or paced ventricular beats.  There is clear evidence of failure to mode switch since half of the atrial beats fall in blanking.  We will change his device programming to DDI to avoid this.  Increased burden of atrial arrhythmia roughly coincides with expected gradual loss of amiodarone effect.  He is not receiving anticoagulation.    Despite his history of atrial fibrillation and stroke he is unable to receive anticoagulation due to a previous left parenchymal temporoparietal hematoma that required surgical evacuation. 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   Past Medical History:  Diagnosis Date  . Arthritis    "all over" (Jul 05, 2013)  . Atrial fibrillation ([place])    not Candidate for Coumadin d/t rectal bleeding  . Benign  prostatic hypertrophy   . Cardiomyopathy, hypertensive ([place])     diastolic dysfunction by 1103 echo  . Coronary artery disease     nonobstructive. 2 vessel. cardiac catheter in Nov 26, 2003 showing 60% LAD occlusion, 30% circumflex occlusion.  . Degenerative joint disease     Right knee.  . Diabetes type 2, uncontrolled ([place])   . Diabetic peripheral neuropathy ([name])   . Diabetic ulcer of thigh ([place])    H/O Rt thigh ulcer.  . Exertional shortness of breath    "comes and goes" (2013-07-05)  . GERD (gastroesophageal reflux disease)   . Gout   . Herpes   . Hyperlipidemia   . Hypertension   . Hypotension 09/05/2013  . Incarcerated ventral hernia    H/O. S/P repair ventral hernia repair 07/2005.  Marland Kitchen LBBB (left bundle branch block)   . Legally blind   . Morbid obesity ([place])   . Obstructive sleep apnea    On CPAP. Last sleep study (06/2009) - Severe obstructive sleep apnea/hypopnea syndrome, apnea-hypopnea index 70.5 per hour with non positional events moderately loud snoring and oxygen desaturation to a nadir of 85% on room air.  . Occult blood positive stool     History of in August 2007.  colonoscopy in January 2008 showing normal colon with fair inadequate prep. By Dr. Silvano Fletcher.  . 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 Sepsis ([place]) 08/26/2013    Past Surgical History:  Procedure Laterality Date  . AMPUTATION Left 06/22/2013   Procedure: AMPUTATION FOOT;  Surgeon: Ryan Minion, MD;  Location: [place];  Service: Orthopedics;  Laterality: Left;  Left Midfoot Amputation  . CARDIAC CATHETERIZATION  10/22/2003   Dilatation of the LV with preserved LV systolic function. Tortous aorta, but no AAA. Moderate disease with 50-60% area of narrowing in the circumflex after the second OM and 70% mid narrowing in the LAD. No intervention.  Marland Kitchen CARDIOVASCULAR STRESS TEST  01/23/2010   Perfusion defect seen in inferior myocardial region is consistent with diaphragmatic attenuation. Remaining  myocardium demonstrates normal myocardial perfusion with no evidence of ischemia or infarct. Stress ECG was non-diagnostic due to LBBB.  Marland Kitchen CATARACT EXTRACTION, BILATERAL Bilateral   . ESOPHAGOGASTRODUODENOSCOPY N/A 10/12/2013   Procedure: ESOPHAGOGASTRODUODENOSCOPY (EGD);  Surgeon: Gwenyth Ober, MD;  Location: [place];  Service: General;  Laterality: N/A;  bedside  . EYE SURGERY    . 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 HERNIA REPAIR    . INSERT / REPLACE / REMOVE PACEMAKER  11/26/03   Secondary to symptomatic bradycardia. DDDR pacemaker. By Dr. Rollene Fare.  Randolm Idol / REPLACE / REMOVE PACEMAKER  11/25/2012   "old pacemaker died; had new one put in" (Jul 05, 2013)  . Pacemaker lead revision  12/2003    likely microdislodgment  of right ventricular lead  . PEG PLACEMENT N/A 10/12/2013   Procedure: PERCUTANEOUS ENDOSCOPIC GASTROSTOMY (PEG) PLACEMENT;  Surgeon: Gwenyth Ober, MD;  Location: [facility];  Service: General;  Laterality: N/A;  . PERMANENT PACEMAKER GENERATOR CHANGE N/A 11/03/2012   Procedure: PERMANENT PACEMAKER GENERATOR CHANGE;  Surgeon: Ryan Klein, MD;  Location: [facility];  Service:  Cardiovascular;  Laterality: N/A;  . REFRACTIVE SURGERY Bilateral   . TRANSTHORACIC ECHOCARDIOGRAM  01/01/2013   EF 50-55%, ventricular septum-thickness moderately increased  . VENOUS DOPPLER  01/17/2013   No evidence of thrombus or thrombophlebitis. Left GSVs appear enlarged and demonstrate valvular insuffiiciency. Bilateral SSVs appeared patent with no valvular insufficiency seen.  Marland Kitchen VENTRAL HERNIA REPAIR  07/2005    S/P laparotomy, lysis of adhesions, repair of incarcerated ventral hernia with  partial omentectomy.. Performed by Dr. Marlou Starks.    Outpatient Medications Prior to Visit  Medication Sig Dispense Refill  . acetaminophen (TYLENOL) 650 MG CR tablet Take 1,300 mg by mouth 2 (two) times daily.     Marland Kitchen albuterol (PROVENTIL HFA;VENTOLIN HFA) 108 (90  Base) MCG/ACT inhaler Inhale 2 puffs into the lungs every 6 (six) hours as needed for wheezing or shortness of breath.    Marland Kitchen amLODipine (NORVASC) 10 MG tablet Take 10 mg by mouth daily. Reported on 12/09/2015    . aspirin 325 MG tablet Take 325 mg by mouth daily.    Marland Kitchen atorvastatin (LIPITOR) 40 MG tablet Take 1 tablet (40 mg total) by mouth daily. 90 tablet 3  . cholecalciferol (VITAMIN D) 1000 units tablet Take 1,000 Units by mouth daily.    . citalopram (CELEXA) 20 MG tablet Take 20 mg by mouth daily.    . Febuxostat (ULORIC) 80 MG TABS Take 1 tablet by mouth daily.    Marland Kitchen gabapentin (NEURONTIN) 300 MG capsule Take 300 mg by mouth 2 (two) times daily.    . hydrALAZINE (APRESOLINE) 50 MG tablet Take 1 tablet (50 mg total) 3 (three) times daily by mouth. 90 tablet 0  . Insulin Glargine (TOUJEO SOLOSTAR) 300 UNIT/ML SOPN Inject 45 Units into the skin 2 (two) times daily.     Marland Kitchen lidocaine (LIDODERM) 5 % Place 1 patch onto the skin daily as needed (back pain). Remove & Discard patch within 12 hours or as directed by MD    . Liraglutide (VICTOZA) 18 MG/3ML SOPN Inject 1.8 mg into the skin daily.    . Melatonin 3 MG CAPS Take 1 capsule by mouth at bedtime as needed (sleep).     . Menthol, Topical Analgesic, (BIOFREEZE EX) Apply 1 application topically 3 (three) times daily as needed (pain). Reported on 10/27/2015    . methylcellulose (ARTIFICIAL TEARS) 1 % ophthalmic solution Place 1 drop into both eyes 2 (two) times daily as needed (dry eyes).     . metolazone (ZAROXOLYN) 2.5 MG tablet Take 1 tablet (2.5 mg total) by mouth daily. 30 tablet 11  . metoprolol succinate (TOPROL-XL) 50 MG 24 hr tablet Take 1 tablet (50 mg total) by mouth daily. Take with or immediately following a meal. 30 tablet 3  . polyethylene glycol (MIRALAX / GLYCOLAX) packet Take 17 g by mouth daily as needed for moderate constipation.    . potassium chloride SA (K-DUR,KLOR-CON) 20 MEQ tablet Take 3 tablets (60 mEq total) by mouth 2 (two)  times daily. 180 tablet 11  . ranitidine (ZANTAC) 150 MG tablet Take 150 mg by mouth daily.     . sennosides-docusate sodium (SENOKOT-S) 8.6-50 MG tablet Take 1 tablet by mouth 2 (two) times daily.     . Skin Protectants, Misc. (EUCERIN) cream Apply 1 application topically 2 (two) times daily.    Marland Kitchen torsemide (DEMADEX) 20 MG tablet Take 6 tablets (120 mg total) by mouth 2 (two) times daily. 360 tablet 3   Facility-Administered Medications Prior to Visit  Medication  Dose Route Frequency Provider Last Rate Last Dose  . chlorhexidine gluconate (MEDLINE KIT) (PERIDEX) 0.12 % solution 15 mL  15 mL Mouth Rinse BID Chesley Mires, MD      . MEDLINE mouth rinse  15 mL Mouth Rinse QID Chesley Mires, MD         Allergies:   Patient has no known allergies.   Social History   Socioeconomic History  . Marital status: Single    Spouse name: Not on file  . Number of children: Y  . Years of education: Not on file  . Highest education level: Not on file  Occupational History  . Occupation: disabled  Social Needs  . Financial resource strain: Not on file  . Food insecurity:    Worry: Not on file    Inability: Not on file  . Transportation needs:    Medical: Not on file    Non-medical: Not on file  Tobacco Use  . Smoking status: Former Smoker    Packs/day: 0.00    Years: 0.00    Pack years: 0.00    Types: Cigarettes    Last attempt to quit: 08/17/2003    Years since quitting: 14.7  . Smokeless tobacco: Never Used  . Tobacco comment: started at age 34.  only smoked on occ- very rarely.0  Substance and Sexual Activity  . Alcohol use: Yes    Comment: 06/23/2013 "drank some; never had a problem w/it"  . Drug use: No  . Sexual activity: Never  Lifestyle  . Physical activity:    Days per week: Not on file    Minutes per session: Not on file  . Stress: Not on file  Relationships  . Social connections:    Talks on phone: Not on file    Gets together: Not on file    Attends religious service:  Not on file    Active member of club or organization: Not on file    Attends meetings of clubs or organizations: Not on file    Relationship status: Not on file  Other Topics Concern  . Not on file  Social History Narrative  . Not on file     Family History:  The patient's family history includes Allergies in his sister; Asthma in his brother; Coronary artery disease in his sister; Heart disease in his sister; Rheum arthritis in his brother.   ROS:   Please see the history of present illness.    ROS All other systems reviewed and are negative.   PHYSICAL EXAM:   VS:  BP 140/82 (BP Location: Left Arm, Patient Position: Sitting, Cuff Size: Large)   Pulse 76   Ht 5' 11.5" (1.816 m)   Wt (!) 323 lb (146.5 kg)   BMI 44.42 kg/m      General: Alert, oriented x3, no distress, his physical exam is limited by morbid obesity. Head: no evidence of trauma, PERRL, EOMI, no exophtalmos or lid lag, no myxedema, no xanthelasma; normal ears, nose and oropharynx Neck: Unable to see his jugular venous pulsations or hepatojugular reflux; brisk carotid pulses without delay and no carotid bruits Chest: clear to auscultation, no signs of consolidation by percussion or palpation, normal fremitus, symmetrical and full respiratory excursions.  Healthy pacemaker site Cardiovascular: normal position and quality of the apical impulse, regular rhythm, normal first and second heart sounds, no murmurs, rubs or gallops Abdomen: no tenderness or distention, no masses by palpation, no abnormal pulsatility or arterial bruits, normal bowel sounds, no hepatosplenomegaly Extremities:  no clubbing, cyanosis or edema; 2+ radial, ulnar and brachial pulses bilaterally; 2+ right femoral, posterior tibial and dorsalis pedis pulses; 2+ left femoral, posterior tibial and dorsalis pedis pulses; no subclavian or femoral bruits Neurological: grossly nonfocal; blind Psych: Normal mood and affect     Wt Readings from Last 3  Encounters:  05/10/18 (!) 323 lb (146.5 kg)  04/11/18 (!) 323 lb 9.6 oz (146.8 kg)  03/21/18 (!) 326 lb 9.6 oz (148.1 kg)    Studies/Labs Reviewed:   EKG:  EKG is ordered today.  The rhythm is atrial paced, ventricular sensed with a single PVC. He has left axis deviation almost meeting criteria for left anterior fascicular block, both without clearcut Q waves in leads 1 and aVL. He has a QS pattern in leads V1 through V3 Recent Labs: 08/15/2017: B Natriuretic Peptide 57.2; Magnesium 2.1 08/16/2017: ALT 26 08/17/2017: Hemoglobin 13.6; Platelets 156 04/11/2018: BUN 71; Creatinine, Ser 2.13; Potassium 3.5; Sodium 138   Lipid Panel    Component Value Date/Time   CHOL 115 03/21/2018 1044   CHOL 129 01/05/2017 1235   TRIG 80 03/21/2018 1044   HDL 33 (L) 03/21/2018 1044   HDL 36 (L) 01/05/2017 1235   CHOLHDL 3.5 03/21/2018 1044   VLDL 16 03/21/2018 1044   LDLCALC 66 03/21/2018 1044   [place] 71 01/05/2017 1235    Additional studies/ records that were reviewed today include:  Heart Failure clinic notes, EP consultation  ASSESSMENT:    1. Chronic diastolic heart failure ([place])   2. SSS (sick sinus syndrome) ([place])   3. Pacemaker   4. Paroxysmal atrial fibrillation (H[facility])   5. Paroxysmal atrial tachycardia ([place])   6. Coronary artery disease involving native coronary artery of native heart without angina pectoris   7. Essential hypertension   8. OSA (obstructive sleep apnea)   9. CKD (chronic kidney disease) stage 3, GFR 30-59 ml/min ([facility])   10. Morbid obesity ([place])   11. Hypercholesterolemia   12. Diabetes mellitus type 2 in obese ([place])   13. Aortic valve stenosis, nonrheumatic      PLAN:  In order of problems listed above:   1. CHF: Physical exam is difficult due to morbid obesity.  As far as I can tell he is euvolemic and his weight is unchanged from his previous appointment and within our target range of 320-330 pounds. Continue same diuretic regimen.  Avoid nephrotoxic agents.   Unable to prescribe RAAS inhibitors.  Expect will continue to have issues maintaining a balance between renal dysfunction and heart failure.   2. SSS: Atrial pacing percentage has decreased drastically due to the very frequent episodes of atrial flutter/atrial tachycardia. 3. PPM: There are numerous episodes of atrial tachycardia-atrial flutter, with failure to mode switch leading to increased frequency of ventricular pacing.  Programming was changed to DDI.  We have tried to set him up for her pacemaker downloads unsuccessfully.  We will see him in the clinic every 6 months. 4. PAF: Current arrhythmia appears to be mostly atrial tachycardia or atypical atrial flutter, no true atrial fibrillation.  Despite high embolic risk not be anticoagulated due to history of intracranial hemorrhage.   5. PAT: Seen by EP and felt to be a poor candidate for ablation.  Amiodarone was stopped due to suspicion of lung toxicity.  Dofetilide and sotalol may be dangerous with his abnormal and volatile kidney function and any antiarrhythmic will be associated with increased atrial and ventricular pacing.  We will have to proceed with  conservative management with beta-blockers only. 6. CAD: Very sedentary, denies angina pectoris.  Had nonobstructive lesions by last coronary angiogram 7. HTN: Control is not perfect, but is acceptable.  No changes made to his medications 8. OSA: 100% compliance with CPAP strongly recommended 9. CKD: Followed at [facility] by Dr. Joelyn Oms.  Over the last 9 months his renal function has been more stable; creatinine approximately 2.2, GFR around 30. 10. Obesity: A major contributor to his heart failure and pulmonary hypertension. 11. HLP: On statin.  Most recent LDL less than 70. 12. DM: Fair control.  Hemoglobin A1c 7.1% last December. 13. AS: Remains in moderate range but has shown some worsening the gradients in the last year.  Recheck echo next summer.  I am not sure he will be a good  candidate even for TAVR due to his numerous comorbid conditions    Medication Adjustments/Labs and Tests Ordered: Current medicines are reviewed at length with the patient today.  Concerns regarding medicines are outlined above.  Medication changes, Labs and Tests ordered today are listed in the Patient Instructions below. Patient Instructions  Medication Instructions: Dr Sallyanne Kuster recommends that you continue on your current medications as directed. Please refer to the Current Medication list given to you today.  Labwork: NONE ORDERED  Testing/Procedures: 1. Echocardiogram in 6 months - Your physician has requested that you have an echocardiogram. Echocardiography is a painless test that uses sound waves to create images of your heart. It provides your doctor with information about the size and shape of your heart and how well your heart's chambers and valves are working. This procedure takes approximately one hour. There are no restrictions for this procedure.  >> This will be performed at our [facility] location [place], [name] [address] [phone]  Follow-up: Dr Sallyanne Kuster recommends that you schedule a follow-up appointment in 6 months with a pacemaker check. You will receive a reminder letter in the mail two months in advance. If you don't receive a letter, please call our office to schedule the follow-up appointment.  If you need a refill on your cardiac medications before your next appointment, please call your pharmacy.      Signed, Ryan Klein, MD  05/12/2018 3:44 PM    [address], [place], [place]  [address] Phone: [phone]; Fax: [phone]

## 2018-05-12 DIAGNOSIS — E1169 Type 2 diabetes mellitus with other specified complication: Secondary | ICD-10-CM | POA: Insufficient documentation

## 2018-05-12 DIAGNOSIS — I350 Nonrheumatic aortic (valve) stenosis: Secondary | ICD-10-CM | POA: Insufficient documentation

## 2018-05-12 DIAGNOSIS — I471 Supraventricular tachycardia: Secondary | ICD-10-CM | POA: Insufficient documentation

## 2018-05-12 DIAGNOSIS — I4719 Other supraventricular tachycardia: Secondary | ICD-10-CM | POA: Insufficient documentation

## 2018-05-12 DIAGNOSIS — E669 Obesity, unspecified: Secondary | ICD-10-CM

## 2018-06-13 ENCOUNTER — Ambulatory Visit (HOSPITAL_COMMUNITY)
Admission: RE | Admit: 2018-06-13 | Discharge: 2018-06-13 | Disposition: A | Discharge status: Home / Self Care | Payer: Medicare (Managed Care) | Source: Ambulatory Visit | Attending: Cardiology | Admitting: Cardiology

## 2018-06-13 ENCOUNTER — Encounter (HOSPITAL_COMMUNITY): Payer: Self-pay | Admitting: Cardiology

## 2018-06-13 VITALS — BP 136/68 | HR 76 | Wt 332.0 lb

## 2018-06-13 DIAGNOSIS — I495 Sick sinus syndrome: Secondary | ICD-10-CM | POA: Diagnosis not present

## 2018-06-13 DIAGNOSIS — E1142 Type 2 diabetes mellitus with diabetic polyneuropathy: Secondary | ICD-10-CM | POA: Diagnosis not present

## 2018-06-13 DIAGNOSIS — I350 Nonrheumatic aortic (valve) stenosis: Secondary | ICD-10-CM | POA: Diagnosis not present

## 2018-06-13 DIAGNOSIS — Z7982 Long term (current) use of aspirin: Secondary | ICD-10-CM | POA: Diagnosis not present

## 2018-06-13 DIAGNOSIS — E1122 Type 2 diabetes mellitus with diabetic chronic kidney disease: Secondary | ICD-10-CM | POA: Insufficient documentation

## 2018-06-13 DIAGNOSIS — Z8249 Family history of ischemic heart disease and other diseases of the circulatory system: Secondary | ICD-10-CM | POA: Insufficient documentation

## 2018-06-13 DIAGNOSIS — R9431 Abnormal electrocardiogram [ECG] [EKG]: Secondary | ICD-10-CM | POA: Insufficient documentation

## 2018-06-13 DIAGNOSIS — Z794 Long term (current) use of insulin: Secondary | ICD-10-CM | POA: Insufficient documentation

## 2018-06-13 DIAGNOSIS — I130 Hypertensive heart and chronic kidney disease with heart failure and stage 1 through stage 4 chronic kidney disease, or unspecified chronic kidney disease: Secondary | ICD-10-CM | POA: Insufficient documentation

## 2018-06-13 DIAGNOSIS — I4819 Other persistent atrial fibrillation: Secondary | ICD-10-CM

## 2018-06-13 DIAGNOSIS — I480 Paroxysmal atrial fibrillation: Secondary | ICD-10-CM | POA: Diagnosis not present

## 2018-06-13 DIAGNOSIS — N183 Chronic kidney disease, stage 3 unspecified: Secondary | ICD-10-CM

## 2018-06-13 DIAGNOSIS — Z79899 Other long term (current) drug therapy: Secondary | ICD-10-CM | POA: Diagnosis not present

## 2018-06-13 DIAGNOSIS — I5032 Chronic diastolic (congestive) heart failure: Secondary | ICD-10-CM | POA: Diagnosis present

## 2018-06-13 DIAGNOSIS — Z87891 Personal history of nicotine dependence: Secondary | ICD-10-CM | POA: Diagnosis not present

## 2018-06-13 DIAGNOSIS — G4733 Obstructive sleep apnea (adult) (pediatric): Secondary | ICD-10-CM

## 2018-06-13 DIAGNOSIS — Z950 Presence of cardiac pacemaker: Secondary | ICD-10-CM | POA: Diagnosis not present

## 2018-06-13 DIAGNOSIS — R001 Bradycardia, unspecified: Secondary | ICD-10-CM | POA: Diagnosis not present

## 2018-06-13 DIAGNOSIS — I712 Thoracic aortic aneurysm, without rupture: Secondary | ICD-10-CM | POA: Insufficient documentation

## 2018-06-13 DIAGNOSIS — H548 Legal blindness, as defined in USA: Secondary | ICD-10-CM | POA: Insufficient documentation

## 2018-06-13 DIAGNOSIS — I2510 Atherosclerotic heart disease of native coronary artery without angina pectoris: Secondary | ICD-10-CM | POA: Diagnosis not present

## 2018-06-13 LAB — BASIC METABOLIC PANEL
ANION GAP: 10 (ref 5–15)
BUN: 53 mg/dL — ABNORMAL HIGH (ref 8–23)
CHLORIDE: 98 mmol/L (ref 98–111)
CO2: 30 mmol/L (ref 22–32)
Calcium: 9.2 mg/dL (ref 8.9–10.3)
Creatinine, Ser: 1.93 mg/dL — ABNORMAL HIGH (ref 0.61–1.24)
GFR calc Af Amer: 39 mL/min — ABNORMAL LOW (ref >60)
GFR, EST NON AFRICAN AMERICAN: 34 mL/min — AB (ref >60)
Glucose, Bld: 212 mg/dL — ABNORMAL HIGH (ref 70–99)
POTASSIUM: 3.5 mmol/L (ref 3.5–5.1)
SODIUM: 138 mmol/L (ref 135–145)

## 2018-06-13 NOTE — Patient Instructions (Addendum)
You had labs drawn today, the office will call you if any results are abnormal.  Your physician has requested that you have an echocardiogram. Echocardiography is a painless test that uses sound waves to create images of your heart. It provides your doctor with information about the size and shape of your heart and how well your heart's chambers and valves are working. This procedure takes approximately one hour. There are no restrictions for this procedure.  Your physician recommends that you schedule a follow-up appointment in: 6 weeks in the NP/PA clinic.

## 2018-06-13 NOTE — Progress Notes (Signed)
Patient ID: [name], male   DOB: 09-Feb-1946, 72 y.o.   MRN: [id number]   [facility]  PCP: [name] Cardiology: Dr. Sallyanne Kuster HF Cardiology: Dr. Aundra Dubin Nephrologist: Dr. Merlene Morse Heinkel is a 72 y.o. 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   He was admitted in 11/18 with acute respiratory failure => intubated.  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  He was treated for acute on chronic diastolic CHF with IV diuresis.    He presents today for followup of CHF.  He currently is taking torsemide 120 mg bid and metolazone 2.5 mg daily.  He is going to [facility] 3 days a week.  He uses a walker.  He is short of breath after walking 100-150 feet, this is stable.  No chest pain.  No PND.  Legs are wrapped.  Since stopping amiodarone, he has had increased burden of atrial arrhythmias (fibrillation, atypical flutter).  Today, he is in atrial fibrillation.   Labs (11/18): hgb 14.8, K 4.6, creatinine 2.34 Labs (08/17/2017): K 3.5 Creatinine 2.16  Labs (11/29/2017): K 3.4 Creatinine 2.52  Labs (6/19): K 3.6, creatinine 2.29 Labs (9/19): K 4.3, creatinine 1.93  ECG (personally reviewed): Atrial fibrillation, v-pacing  PMH: 1. Chronic diastolic CHF with RV dysfunction: Echo (2/15) with EF 60%, mild LVH, mild aortic stenosis with mean gradient 18 mmHg, RV mildly dilated with mildly decreased systolic function.  - Echo (10/18): EF 60-65%, moderate aortic stenosis.  - Echo (8/19): EF 60-65%, mild LVH, moderate AS with mean gradient 29 mmHg/AVA 1.1 cm^2, PASP 45, normal RV size with mildly decreased systolic function.  2. Aortic stenosis: Moderate on 8/19 echo.   3. Type II diabetes 4. 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  - 3.8 cm ascending aorta on 8/19 echo.  5. OSA: Uses  CPAP.  6. Morbid obesity.  7. HTN 8. CAD: Nonobstructive.  [place] 2005 with 60% LAD stenosis. 9. 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  10. Atrial fibrillation: Paroxysmal.  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  11. CKD stage III 12. Macular degeneration: Legally blind.  13. 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  14. 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 15. S/p left foot amputation 16. Diabetic peripheral neuropathy.  52. H/o atrial tachycardia  Social History   Socioeconomic History  . Marital status: Single    Spouse name: Not on file  . Number of children: Y  . Years of education: Not on file  . Highest education level: Not on file  Occupational History  . Occupation: disabled  Social Needs  . Financial resource strain: Not on file  . Food insecurity:    Worry: Not on file    Inability: Not on file  . Transportation needs:    Medical: Not on file    Non-medical: Not on file  Tobacco Use  . Smoking status: Former Smoker    Packs/day: 0.00    Years: 0.00    Pack years: 0.00    Types: Cigarettes    Last attempt to quit: 08/17/2003    Years since quitting: 14.8  . Smokeless tobacco: Never Used  . Tobacco comment: started at age 77.  only smoked on occ- very rarely.0  Substance and Sexual Activity  . Alcohol use: Yes    Comment:  06/23/2013 "drank some; never had a problem w/it"  . Drug use: No  . Sexual activity: Never  Lifestyle  . Physical activity:    Days per week: Not on file    Minutes per session: Not on file  . Stress: Not on file  Relationships  . Social connections:    Talks on phone: Not on file    Gets together: Not on file    Attends religious service: Not on file    Active member of club or organization: Not on file    Attends meetings of clubs or organizations: Not on file    Relationship status: Not on file  . Intimate partner violence:    Fear of current or ex partner: Not on file    Emotionally  abused: Not on file    Physically abused: Not on file    Forced sexual activity: Not on file  Other Topics Concern  . Not on file  Social History Narrative  . Not on file    Family History  Problem Relation Age of Onset  . Coronary artery disease Sister   . Heart disease Sister   . Allergies Sister   . Asthma Brother   . Rheum arthritis Brother    Review of systems complete and found to be negative unless listed in HPI.    Current Outpatient Medications  Medication Sig Dispense Refill  . acetaminophen (TYLENOL) 650 MG CR tablet Take 1,300 mg by mouth 2 (two) times daily.     Marland Kitchen amLODipine (NORVASC) 10 MG tablet Take 10 mg by mouth daily. Reported on 12/09/2015    . aspirin 325 MG tablet Take 325 mg by mouth daily.    Marland Kitchen atorvastatin (LIPITOR) 40 MG tablet Take 1 tablet (40 mg total) by mouth daily. 90 tablet 3  . cholecalciferol (VITAMIN D) 1000 units tablet Take 1,000 Units by mouth daily.    . Febuxostat (ULORIC) 80 MG TABS Take 1 tablet by mouth daily.    Marland Kitchen gabapentin (NEURONTIN) 300 MG capsule Take 300 mg by mouth 2 (two) times daily.    . hydrALAZINE (APRESOLINE) 50 MG tablet Take 1 tablet (50 mg total) 3 (three) times daily by mouth. 90 tablet 0  . Insulin Glargine (TOUJEO SOLOSTAR) 300 UNIT/ML SOPN Inject 45 Units into the skin 2 (two) times daily.     Marland Kitchen lidocaine (LIDODERM) 5 % Place 1 patch onto the skin daily as needed (back pain). Remove & Discard patch within 12 hours or as directed by MD    . Liraglutide (VICTOZA) 18 MG/3ML SOPN Inject 1.8 mg into the skin daily.    . Melatonin 3 MG CAPS Take 1 capsule by mouth at bedtime as needed (sleep).     . Menthol, Topical Analgesic, (BIOFREEZE EX) Apply 1 application topically 3 (three) times daily as needed (pain). Reported on 10/27/2015    . methylcellulose (ARTIFICIAL TEARS) 1 % ophthalmic solution Place 1 drop into both eyes 2 (two) times daily as needed (dry eyes).     . metolazone (ZAROXOLYN) 2.5 MG tablet Take 1 tablet (2.5  mg total) by mouth daily. 30 tablet 11  . metoprolol succinate (TOPROL-XL) 50 MG 24 hr tablet Take 1 tablet (50 mg total) by mouth daily. Take with or immediately following a meal. 30 tablet 3  . polyethylene glycol (MIRALAX / GLYCOLAX) packet Take 17 g by mouth daily as needed for moderate constipation.    . potassium chloride SA (K-DUR,KLOR-CON) 20 MEQ tablet Take 3 tablets (  60 mEq total) by mouth 2 (two) times daily. 180 tablet 11  . ranitidine (ZANTAC) 150 MG tablet Take 150 mg by mouth daily.     . sennosides-docusate sodium (SENOKOT-S) 8.6-50 MG tablet Take 1 tablet by mouth 2 (two) times daily.     . Skin Protectants, Misc. (EUCERIN) cream Apply 1 application topically 2 (two) times daily.    Marland Kitchen torsemide (DEMADEX) 20 MG tablet Take 6 tablets (120 mg total) by mouth 2 (two) times daily. 360 tablet 3   No current facility-administered medications for this encounter.    Facility-Administered Medications Ordered in Other Encounters  Medication Dose Route Frequency Provider Last Rate Last Dose  . chlorhexidine gluconate (MEDLINE KIT) (PERIDEX) 0.12 % solution 15 mL  15 mL Mouth Rinse BID Chesley Mires, MD      . MEDLINE mouth rinse  15 mL Mouth Rinse QID Chesley Mires, MD       Vitals:   06/13/18 1028  BP: 136/68  Pulse: 76  SpO2: 96%  Weight: (!) 150.6 kg (332 lb)    Wt Readings from Last 3 Encounters:  06/13/18 (!) 150.6 kg (332 lb)  05/10/18 (!) 146.5 kg (323 lb)  04/11/18 (!) 146.8 kg (323 lb 9.6 oz)    Physical Exam General: NAD, obese Neck: No JVD, no thyromegaly or thyroid nodule.  Lungs: Occasional rhonchi. CV: Nondisplaced PMI.  Heart regular S1/S2, no S3/S4, 3/6 crescendo-decrescendo murmur RUSB with quiet S2. 1+ edema 1/2 up lower legs.  No carotid bruit.  Normal pedal pulses.  Abdomen: Soft, nontender, no hepatosplenomegaly, no distention.  Skin: Intact without lesions or rashes.  Neurologic: Alert and oriented x 3.  Psych: Normal affect. Extremities: No clubbing or  cyanosis.  HEENT: Normal.   Assessment/Plan:  1. Chronic diastolic CHF with prominent RV failure: Echo 03/2018 EF 60-65%, moderate AS, PASP 45 mmHg, mildly decreased RV systolic function. NYHA class III symptoms, stable. He has peripheral edema but no JVD, volume seems reasonably controlled today.  - Continue torsemide 120 mg BID - Continue daily metolazone 2.5 mg - He is on a very aggressive diuretic regimen.  He may eventually require hemodialysis for effective volume control.  - BMET today.  2. Aortic stenosis:  Moderate by Echo 03/2018  -  Follow closely, would repeat study in 6 months as AS appears to be worsening and he has significant baseline dyspnea (echo in 2/20).  3. Atrial fibrillation:  He is in atrial fibrillation today.  He is not anticoagulated (takes ASA) due to prior intracerebral hemorrhage. 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  As amiodarone has worn out of his system, atrial arrhythmia burden has worsened.  - As he is not anticoagulated, would continue rate control.  Cannot do DCCV without anticoagulation.  - Continue Toprol XL 50 mg daily.  4. CAD: Nonobstructive.  No chest pain.  - Continue ASA and statin.   5. 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 CKD: Stage III.   - BMET today.   7. OSA: Continue nightly CPAP.  8. Ascending aorta aneurysm: 4.0 cm on 11/18 CT chest.  8/19 echo with 3.8 cm ascending aorta.  Followup in 6 wks with APP.     Loralie Champagne, MD  06/13/2018

## 2018-07-25 ENCOUNTER — Encounter (HOSPITAL_COMMUNITY): Payer: Self-pay

## 2018-07-25 ENCOUNTER — Telehealth (HOSPITAL_COMMUNITY): Payer: Self-pay | Admitting: Cardiology

## 2018-07-25 ENCOUNTER — Ambulatory Visit (HOSPITAL_COMMUNITY)
Admission: RE | Admit: 2018-07-25 | Discharge: 2018-07-25 | Disposition: A | Discharge status: Home / Self Care | Payer: Medicare (Managed Care) | Source: Ambulatory Visit | Attending: Internal Medicine | Admitting: Internal Medicine

## 2018-07-25 VITALS — BP 152/88 | HR 85 | Wt 333.2 lb

## 2018-07-25 DIAGNOSIS — Z7982 Long term (current) use of aspirin: Secondary | ICD-10-CM | POA: Diagnosis not present

## 2018-07-25 DIAGNOSIS — I130 Hypertensive heart and chronic kidney disease with heart failure and stage 1 through stage 4 chronic kidney disease, or unspecified chronic kidney disease: Secondary | ICD-10-CM | POA: Insufficient documentation

## 2018-07-25 DIAGNOSIS — Z794 Long term (current) use of insulin: Secondary | ICD-10-CM | POA: Insufficient documentation

## 2018-07-25 DIAGNOSIS — I4891 Unspecified atrial fibrillation: Secondary | ICD-10-CM | POA: Insufficient documentation

## 2018-07-25 DIAGNOSIS — N183 Chronic kidney disease, stage 3 unspecified: Secondary | ICD-10-CM

## 2018-07-25 DIAGNOSIS — Z79899 Other long term (current) drug therapy: Secondary | ICD-10-CM | POA: Diagnosis not present

## 2018-07-25 DIAGNOSIS — I2510 Atherosclerotic heart disease of native coronary artery without angina pectoris: Secondary | ICD-10-CM | POA: Insufficient documentation

## 2018-07-25 DIAGNOSIS — G4733 Obstructive sleep apnea (adult) (pediatric): Secondary | ICD-10-CM

## 2018-07-25 DIAGNOSIS — Z87891 Personal history of nicotine dependence: Secondary | ICD-10-CM | POA: Diagnosis not present

## 2018-07-25 DIAGNOSIS — I5032 Chronic diastolic (congestive) heart failure: Secondary | ICD-10-CM | POA: Insufficient documentation

## 2018-07-25 DIAGNOSIS — R001 Bradycardia, unspecified: Secondary | ICD-10-CM | POA: Insufficient documentation

## 2018-07-25 DIAGNOSIS — Z8249 Family history of ischemic heart disease and other diseases of the circulatory system: Secondary | ICD-10-CM | POA: Diagnosis not present

## 2018-07-25 DIAGNOSIS — E1122 Type 2 diabetes mellitus with diabetic chronic kidney disease: Secondary | ICD-10-CM | POA: Insufficient documentation

## 2018-07-25 DIAGNOSIS — I350 Nonrheumatic aortic (valve) stenosis: Secondary | ICD-10-CM | POA: Diagnosis not present

## 2018-07-25 DIAGNOSIS — I712 Thoracic aortic aneurysm, without rupture: Secondary | ICD-10-CM | POA: Diagnosis not present

## 2018-07-25 DIAGNOSIS — I4819 Other persistent atrial fibrillation: Secondary | ICD-10-CM | POA: Diagnosis not present

## 2018-07-25 NOTE — Addendum Note (Signed)
Encounter addended by: Jorge Ny, LCSW on: 07/25/2018 11:16 AM  Actions taken: Visit Navigator Flowsheet section accepted, Pend clinical note

## 2018-07-25 NOTE — Progress Notes (Signed)
Patient ID: [name], male   DOB: Jun 05, 1946, 72 y.o.   MRN: [id number]   [facility]  PCP: [name] Cardiology: Dr. Sallyanne Kuster HF Cardiology: Dr. Aundra Dubin Nephrologist: Dr. Merlene Morse [name] is a 72 y.o. 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   He was admitted in 11/18 with acute respiratory failure => intubated.  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  He was treated for acute on chronic diastolic CHF with IV diuresis.    He presents today for follow up for his CHF. He feels volume overloaded and more SOB, diuretics increased yesterday. [facility] provider recently increased his metolazone to 5 mg on Mon/Thursday and 2.5 mg every other day. He also takes torsemide 120 mg BID. He wears CPAP every night, so denies orthopnea. Denies dizziness or lightheadedness. Has occasional chest discomfort when he is SOB. Legs are no longer being wrapped, using compression hose, and feels like they don't work as well.   Labs (11/18): hgb 14.8, K 4.6, creatinine 2.34 Labs (08/17/2017): K 3.5 Creatinine 2.16  Labs (11/29/2017): K 3.4 Creatinine 2.52  Labs (6/19): K 3.6, creatinine 2.29 Labs (9/19): K 4.3, creatinine 1.93  ECG (personally reviewed): Atrial fibrillation, v-pacing  PMH: 1. Chronic diastolic CHF with RV dysfunction: Echo (2/15) with EF 60%, mild LVH, mild aortic stenosis with mean gradient 18 mmHg, RV mildly dilated with mildly decreased systolic function.  - Echo (10/18): EF 60-65%, moderate aortic stenosis.  - Echo (8/19): EF 60-65%, mild LVH, moderate AS with mean gradient 29 mmHg/AVA 1.1 cm^2, PASP 45, normal RV size with mildly decreased systolic function.  2. Aortic stenosis: Moderate on 8/19 echo.   3. Type II diabetes 4. 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    - 3.8 cm ascending aorta on 8/19 echo.  5. OSA: Uses CPAP.  6. Morbid obesity.  7. HTN 8. CAD: Nonobstructive.  [place] 2005 with 60% LAD stenosis. 9. 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  10. Atrial fibrillation: Paroxysmal.  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  11. CKD stage III 12. Macular degeneration: Legally blind.  13. 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  14. 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 15. S/p left foot amputation 16. Diabetic peripheral neuropathy.  16. H/o atrial tachycardia  Social History   Socioeconomic History  . Marital status: Single    Spouse name: Not on file  . Number of children: Y  . Years of education: Not on file  . Highest education level: Not on file  Occupational History  . Occupation: disabled  Social Needs  . Financial resource strain: Not on file  . Food insecurity:    Worry: Not on file    Inability: Not on file  . Transportation needs:    Medical: Not on file    Non-medical: Not on file  Tobacco Use  . Smoking status: Former Smoker    Packs/day: 0.00    Years: 0.00    Pack years: 0.00    Types: Cigarettes    Last attempt to quit: 08/17/2003    Years since quitting: 14.9  . Smokeless tobacco: Never Used  . Tobacco comment: started at age 28.  only smoked on occ- very rarely.0  Substance and Sexual Activity  . Alcohol use: Yes    Comment: 06/23/2013 "  drank some; never had a problem w/it"  . Drug use: No  . Sexual activity: Never  Lifestyle  . Physical activity:    Days per week: Not on file    Minutes per session: Not on file  . Stress: Not on file  Relationships  . Social connections:    Talks on phone: Not on file    Gets together: Not on file    Attends religious service: Not on file    Active member of club or organization: Not on file    Attends meetings of clubs or organizations: Not on file    Relationship status: Not on file  . Intimate partner violence:    Fear  of current or ex partner: Not on file    Emotionally abused: Not on file    Physically abused: Not on file    Forced sexual activity: Not on file  Other Topics Concern  . Not on file  Social History Narrative  . Not on file    Family History  Problem Relation Age of Onset  . Coronary artery disease Sister   . Heart disease Sister   . Allergies Sister   . Asthma Brother   . Rheum arthritis Brother    Review of systems complete and found to be negative unless listed in HPI.    Current Outpatient Medications  Medication Sig Dispense Refill  . acetaminophen (TYLENOL) 650 MG CR tablet Take 1,300 mg by mouth 2 (two) times daily.     Marland Kitchen amLODipine (NORVASC) 10 MG tablet Take 10 mg by mouth daily. Reported on 12/09/2015    . aspirin 325 MG tablet Take 325 mg by mouth daily.    Marland Kitchen atorvastatin (LIPITOR) 40 MG tablet Take 1 tablet (40 mg total) by mouth daily. 90 tablet 3  . cholecalciferol (VITAMIN D) 1000 units tablet Take 1,000 Units by mouth daily.    . Febuxostat (ULORIC) 80 MG TABS Take 1 tablet by mouth daily.    Marland Kitchen gabapentin (NEURONTIN) 300 MG capsule Take 300 mg by mouth 2 (two) times daily.    . hydrALAZINE (APRESOLINE) 50 MG tablet Take 1 tablet (50 mg total) 3 (three) times daily by mouth. 90 tablet 0  . Insulin Glargine (TOUJEO SOLOSTAR) 300 UNIT/ML SOPN Inject 45 Units into the skin 2 (two) times daily.     Marland Kitchen lidocaine (LIDODERM) 5 % Place 1 patch onto the skin daily as needed (back pain). Remove & Discard patch within 12 hours or as directed by MD    . Liraglutide (VICTOZA) 18 MG/3ML SOPN Inject 1.8 mg into the skin daily.    . Melatonin 3 MG CAPS Take 1 capsule by mouth at bedtime as needed (sleep).     . Menthol, Topical Analgesic, (BIOFREEZE EX) Apply 1 application topically 3 (three) times daily as needed (pain). Reported on 10/27/2015    . methylcellulose (ARTIFICIAL TEARS) 1 % ophthalmic solution Place 1 drop into both eyes 2 (two) times daily as needed (dry eyes).     .  metolazone (ZAROXOLYN) 2.5 MG tablet Take 2.5 mg by mouth daily. 5 mg one Mondays and Thursdays, 2.5 mg every other day    . metoprolol succinate (TOPROL-XL) 50 MG 24 hr tablet Take 1 tablet (50 mg total) by mouth daily. Take with or immediately following a meal. 30 tablet 3  . polyethylene glycol (MIRALAX / GLYCOLAX) packet Take 17 g by mouth daily as needed for moderate constipation.    . potassium chloride SA (K-DUR,KLOR-CON)  20 MEQ tablet Take 20 mEq by mouth 2 (two) times daily. 60 MEQ in the AM 40 meq mid-day 60 meq in the PM    . ranitidine (ZANTAC) 150 MG tablet Take 150 mg by mouth daily.     . sennosides-docusate sodium (SENOKOT-S) 8.6-50 MG tablet Take 1 tablet by mouth 2 (two) times daily.     . Skin Protectants, Misc. (EUCERIN) cream Apply 1 application topically 2 (two) times daily.    Marland Kitchen torsemide (DEMADEX) 20 MG tablet Take 6 tablets (120 mg total) by mouth 2 (two) times daily. 360 tablet 3   No current facility-administered medications for this encounter.    Facility-Administered Medications Ordered in Other Encounters  Medication Dose Route Frequency Provider Last Rate Last Dose  . chlorhexidine gluconate (MEDLINE KIT) (PERIDEX) 0.12 % solution 15 mL  15 mL Mouth Rinse BID Chesley Mires, MD      . MEDLINE mouth rinse  15 mL Mouth Rinse QID Chesley Mires, MD       Vitals:   07/25/18 0859  BP: (!) 152/88  Pulse: 85  SpO2: 93%  Weight: (!) 151.1 kg (333 lb 3.2 oz)    Wt Readings from Last 3 Encounters:  07/25/18 (!) 151.1 kg (333 lb 3.2 oz)  06/13/18 (!) 150.6 kg (332 lb)  05/10/18 (!) 146.5 kg (323 lb)    Physical Exam General: No resp difficulty. HEENT: Normal, Blind Neck: Supple. JVP difficult, but appears around 6-7 cm. Carotids 2+ bilat; no bruits. No thyromegaly or nodule noted. Cor: PMI nondisplaced. RRR, 3/6 crescendo-decrescendo murmur RUSB with quiet S2. Lungs: CTAB, normal effort. Abdomen: Obese, soft, non-tender, non-distended, no HSM. No bruits or masses.  +BS  Extremities: No cyanosis, clubbing, or rash. Chronic 1-2+ edema to knees.  Neuro: Alert & orientedx3, cranial nerves grossly intact. moves all 4 extremities w/o difficulty. Affect pleasant   Assessment/Plan:  1. Chronic diastolic CHF with prominent RV failure: Echo 03/2018 EF 60-65%, moderate AS, PASP 45 mmHg, mildly decreased RV systolic function. NYHA class III symptoms, and stable. He has peripheral edema. His weight is the same from October and his JVP is not elevated. His volume status actually looks OK today with increase of metolazone.  - Continue torsemide 120 mg BID - Continue daily metolazone 2.5 mg and 5 mg on Monday/Thursday per [facility]. Needs BMET next week.  - He is on a very aggressive diuretic regimen.  He may eventually require hemodialysis for effective volume control.  - Cr 1.89 07/21/18 and metolazone just increased yesterday. Needs recheck next week.  2. Aortic stenosis:  Moderate by Echo 03/2018  -  Follow closely, would repeat study in 6 months as AS appears to be worsening and he has significant baseline dyspnea (echo in 2/20).  3. Atrial fibrillation:  He is in atrial fibrillation today.  He is not anticoagulated (takes ASA) due to prior intracerebral hemorrhage. 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  As amiodarone has worn out of his system, atrial arrhythmia burden has worsened.  - As he is not anticoagulated, would continue rate control.  Cannot do DCCV without anticoagulation.  - Continue Toprol XL 50 mg daily.  4. CAD: Nonobstructive.   - No s/s of ischemia.    - Continue ASA and statin.   5. Bradycardia s/p Medtronic PPM. - Stable.  6. CKD: Stage III.   - Cr 1.89 07/21/18. Needs repeat BMET via [facility] next week with med change.  7. OSA:  -  Continue nightly CPAP.  8. Ascending aorta aneurysm:  - 4.0 cm on 11/18 CT chest.  8/19 echo with 3.8 cm ascending aorta. No change.   [name] understands he is on the maximum  dose of diuretics, and may eventually need Dialysis. At this juncture, he would not want Dialysis. He verbalizes understanding that the only alternative, were he to require HD, would be hospice. Discussed at length today. Needs to follow up with his nephrologist, Dr. Joelyn Oms to discuss.    [facility]: Pt needs to follow up with [facility] (Dr. Joelyn Oms) and we would recommend a repeat BMET next week with increase of metolazone.  Shirley Friar, PA-C  07/25/2018   Greater than 50% of the 25 minute visit was spent in counseling/coordination of care regarding disease state education, salt/fluid restriction, sliding scale diuretics, and medication compliance.

## 2018-07-25 NOTE — Patient Instructions (Signed)
Please schedule a followup with Dr Joelyn Oms  Your physician recommends that you schedule a follow-up appointment in: 6-8 weeks with Dr Aundra Dubin  Do the following things EVERYDAY: 1) Weigh yourself in the morning before breakfast. Write it down and keep it in a log. 2) Take your medicines as prescribed 3) Eat low salt foods-Limit salt (sodium) to 2000 mg per day.  4) Stay as active as you can everyday 5) Limit all fluids for the day to less than 2 liters    At the [facility], you and your health needs are our priority. As part of our continuing mission to provide you with exceptional heart care, we have created designated Provider Care Teams. These Care Teams include your primary Cardiologist (physician) and Advanced Practice Providers (APPs- Physician Assistants and Nurse Practitioners) who all work together to provide you with the care you need, when you need it.   You may see any of the following providers on your designated Care Team at your next follow up: Marland Kitchen Dr Glori Bickers . Dr Loralie Champagne . Darrick Grinder, NP . Lillia Mountain, NP . Rebecca Eaton

## 2018-07-25 NOTE — Addendum Note (Signed)
Encounter addended by: Jorge Ny, LCSW on: 07/25/2018 4:20 PM  Actions taken: Sign clinical note

## 2018-07-25 NOTE — Telephone Encounter (Signed)
-----   Message from Shirley Friar, PA-C sent at 07/25/2018  9:28 AM EST ----- Please fax to [facility]

## 2018-07-25 NOTE — Telephone Encounter (Signed)
As requested OV routed to [facility] MD via epic route and faxed to [phone]

## 2018-07-25 NOTE — Progress Notes (Addendum)
CSW met with pt to discuss concerns expressed on SDOH assessment.  Pt reports that he has concerns regarding his housing which he states is not disability friendly.  When CSW inquired further he states that he is on the first floor and that there is a ramp to the sidewalk- he states his concerns are with the inside of his house but then states he has grab bars and other assistive devices that were provided by [facility].  Pt also reports concerns with getting enough food.  Pt gets meals 3x week at [employer] and has Meals on Wheels the other 2days a week.  Pt states that he has an aid that comes in the morning and the evening and she cooks for him and gets groceries when needed.  After discussed with CSW pt acknowledges that he has sufficient food and access to food.  Pt also indicated concerns with transportation.  Per patient he gets transport to all medical appointments through [facility] but sometimes has trouble getting transport elsewhere.  Pt states aid is able to transport him sometimes and that he has a nephew who sometimes transports him as well- pt is not a candidate for [facility] transport as he cannot navigate the community by himself.  Pt requests help getting a device that can read money so that he knows more about what money he has- has concerns that people are taking his money because he cannot read the bills.  Pt has Education officer, museum at [employer], who he has informed about this and gives permission to CSW to reach out to.  CSW spoke with social worker Lelon Frohlich who states there is a request in for an ibill reader but they might decide not to purchase it for the pt.  CSW informed Lelon Frohlich that there is an application through the [employer] that could get the patient a ibill reader for free. CSW filled out application and sent to the [place] [employer] Reader Program- CSW provided phone number for CSW so she can help follow up if he will get a free reader prior to [facility] providing one for him.  CSW will  continue to follow in clinic and assist as needed  Jorge Ny, [employer] Worker [facility] [phone]

## 2018-07-28 ENCOUNTER — Other Ambulatory Visit (HOSPITAL_COMMUNITY): Payer: Self-pay | Admitting: Cardiology

## 2018-08-21 ENCOUNTER — Ambulatory Visit
Admission: RE | Admit: 2018-08-21 | Discharge: 2018-08-21 | Disposition: A | Discharge status: Home / Self Care | Payer: Medicare (Managed Care) | Source: Ambulatory Visit | Attending: Nurse Practitioner | Admitting: Nurse Practitioner

## 2018-08-21 ENCOUNTER — Other Ambulatory Visit: Payer: Self-pay | Admitting: Nurse Practitioner

## 2018-08-21 DIAGNOSIS — R0602 Shortness of breath: Secondary | ICD-10-CM

## 2018-08-31 ENCOUNTER — Emergency Department (HOSPITAL_COMMUNITY): Payer: Medicare (Managed Care)

## 2018-08-31 ENCOUNTER — Encounter (HOSPITAL_COMMUNITY): Payer: Self-pay | Admitting: *Deleted

## 2018-08-31 ENCOUNTER — Inpatient Hospital Stay (HOSPITAL_COMMUNITY)
Admission: EM | Admit: 2018-08-31 | Discharge: 2018-09-08 | DRG: 286 | Disposition: A | Discharge status: Skilled Nursing Facility | Payer: Medicare (Managed Care) | Attending: Family Medicine | Admitting: Family Medicine

## 2018-08-31 ENCOUNTER — Other Ambulatory Visit: Payer: Self-pay

## 2018-08-31 DIAGNOSIS — Z9842 Cataract extraction status, left eye: Secondary | ICD-10-CM

## 2018-08-31 DIAGNOSIS — K219 Gastro-esophageal reflux disease without esophagitis: Secondary | ICD-10-CM | POA: Diagnosis present

## 2018-08-31 DIAGNOSIS — Z6841 Body Mass Index (BMI) 40.0 and over, adult: Secondary | ICD-10-CM | POA: Diagnosis not present

## 2018-08-31 DIAGNOSIS — Z8261 Family history of arthritis: Secondary | ICD-10-CM

## 2018-08-31 DIAGNOSIS — N400 Enlarged prostate without lower urinary tract symptoms: Secondary | ICD-10-CM | POA: Diagnosis present

## 2018-08-31 DIAGNOSIS — Z950 Presence of cardiac pacemaker: Secondary | ICD-10-CM

## 2018-08-31 DIAGNOSIS — H548 Legal blindness, as defined in USA: Secondary | ICD-10-CM | POA: Diagnosis present

## 2018-08-31 DIAGNOSIS — I480 Paroxysmal atrial fibrillation: Secondary | ICD-10-CM | POA: Diagnosis present

## 2018-08-31 DIAGNOSIS — Z825 Family history of asthma and other chronic lower respiratory diseases: Secondary | ICD-10-CM

## 2018-08-31 DIAGNOSIS — Z8673 Personal history of transient ischemic attack (TIA), and cerebral infarction without residual deficits: Secondary | ICD-10-CM

## 2018-08-31 DIAGNOSIS — Z87891 Personal history of nicotine dependence: Secondary | ICD-10-CM | POA: Diagnosis not present

## 2018-08-31 DIAGNOSIS — N179 Acute kidney failure, unspecified: Secondary | ICD-10-CM | POA: Diagnosis present

## 2018-08-31 DIAGNOSIS — H3530 Unspecified macular degeneration: Secondary | ICD-10-CM | POA: Diagnosis present

## 2018-08-31 DIAGNOSIS — E876 Hypokalemia: Secondary | ICD-10-CM | POA: Diagnosis present

## 2018-08-31 DIAGNOSIS — Z79899 Other long term (current) drug therapy: Secondary | ICD-10-CM

## 2018-08-31 DIAGNOSIS — I2510 Atherosclerotic heart disease of native coronary artery without angina pectoris: Secondary | ICD-10-CM | POA: Diagnosis present

## 2018-08-31 DIAGNOSIS — Z794 Long term (current) use of insulin: Secondary | ICD-10-CM | POA: Diagnosis not present

## 2018-08-31 DIAGNOSIS — N1832 Chronic kidney disease, stage 3b: Secondary | ICD-10-CM | POA: Diagnosis present

## 2018-08-31 DIAGNOSIS — R001 Bradycardia, unspecified: Secondary | ICD-10-CM | POA: Diagnosis present

## 2018-08-31 DIAGNOSIS — E0822 Diabetes mellitus due to underlying condition with diabetic chronic kidney disease: Secondary | ICD-10-CM

## 2018-08-31 DIAGNOSIS — I447 Left bundle-branch block, unspecified: Secondary | ICD-10-CM | POA: Diagnosis present

## 2018-08-31 DIAGNOSIS — J9621 Acute and chronic respiratory failure with hypoxia: Secondary | ICD-10-CM | POA: Diagnosis present

## 2018-08-31 DIAGNOSIS — I350 Nonrheumatic aortic (valve) stenosis: Secondary | ICD-10-CM | POA: Diagnosis present

## 2018-08-31 DIAGNOSIS — I959 Hypotension, unspecified: Secondary | ICD-10-CM | POA: Diagnosis present

## 2018-08-31 DIAGNOSIS — M109 Gout, unspecified: Secondary | ICD-10-CM | POA: Diagnosis present

## 2018-08-31 DIAGNOSIS — I878 Other specified disorders of veins: Secondary | ICD-10-CM | POA: Diagnosis present

## 2018-08-31 DIAGNOSIS — N184 Chronic kidney disease, stage 4 (severe): Secondary | ICD-10-CM | POA: Diagnosis present

## 2018-08-31 DIAGNOSIS — Z7982 Long term (current) use of aspirin: Secondary | ICD-10-CM

## 2018-08-31 DIAGNOSIS — E7800 Pure hypercholesterolemia, unspecified: Secondary | ICD-10-CM | POA: Diagnosis present

## 2018-08-31 DIAGNOSIS — Z9981 Dependence on supplemental oxygen: Secondary | ICD-10-CM

## 2018-08-31 DIAGNOSIS — Z89432 Acquired absence of left foot: Secondary | ICD-10-CM

## 2018-08-31 DIAGNOSIS — N183 Chronic kidney disease, stage 3 unspecified: Secondary | ICD-10-CM

## 2018-08-31 DIAGNOSIS — G4733 Obstructive sleep apnea (adult) (pediatric): Secondary | ICD-10-CM | POA: Diagnosis present

## 2018-08-31 DIAGNOSIS — I43 Cardiomyopathy in diseases classified elsewhere: Secondary | ICD-10-CM | POA: Diagnosis present

## 2018-08-31 DIAGNOSIS — M1711 Unilateral primary osteoarthritis, right knee: Secondary | ICD-10-CM | POA: Diagnosis present

## 2018-08-31 DIAGNOSIS — I10 Essential (primary) hypertension: Secondary | ICD-10-CM | POA: Diagnosis present

## 2018-08-31 DIAGNOSIS — E1142 Type 2 diabetes mellitus with diabetic polyneuropathy: Secondary | ICD-10-CM | POA: Diagnosis present

## 2018-08-31 DIAGNOSIS — R0609 Other forms of dyspnea: Secondary | ICD-10-CM | POA: Diagnosis not present

## 2018-08-31 DIAGNOSIS — I130 Hypertensive heart and chronic kidney disease with heart failure and stage 1 through stage 4 chronic kidney disease, or unspecified chronic kidney disease: Principal | ICD-10-CM | POA: Diagnosis present

## 2018-08-31 DIAGNOSIS — I5033 Acute on chronic diastolic (congestive) heart failure: Secondary | ICD-10-CM | POA: Diagnosis present

## 2018-08-31 DIAGNOSIS — Z8249 Family history of ischemic heart disease and other diseases of the circulatory system: Secondary | ICD-10-CM

## 2018-08-31 DIAGNOSIS — J449 Chronic obstructive pulmonary disease, unspecified: Secondary | ICD-10-CM | POA: Diagnosis present

## 2018-08-31 DIAGNOSIS — E1122 Type 2 diabetes mellitus with diabetic chronic kidney disease: Secondary | ICD-10-CM | POA: Diagnosis present

## 2018-08-31 DIAGNOSIS — J9601 Acute respiratory failure with hypoxia: Secondary | ICD-10-CM | POA: Diagnosis not present

## 2018-08-31 DIAGNOSIS — E662 Morbid (severe) obesity with alveolar hypoventilation: Secondary | ICD-10-CM | POA: Diagnosis present

## 2018-08-31 DIAGNOSIS — E785 Hyperlipidemia, unspecified: Secondary | ICD-10-CM | POA: Diagnosis present

## 2018-08-31 DIAGNOSIS — I2721 Secondary pulmonary arterial hypertension: Secondary | ICD-10-CM | POA: Diagnosis present

## 2018-08-31 DIAGNOSIS — Z9841 Cataract extraction status, right eye: Secondary | ICD-10-CM

## 2018-08-31 DIAGNOSIS — I509 Heart failure, unspecified: Secondary | ICD-10-CM

## 2018-08-31 DIAGNOSIS — R0600 Dyspnea, unspecified: Secondary | ICD-10-CM

## 2018-08-31 LAB — BASIC METABOLIC PANEL
Anion gap: 13 (ref 5–15)
BUN: 63 mg/dL — AB (ref 8–23)
CO2: 29 mmol/L (ref 22–32)
Calcium: 9.4 mg/dL (ref 8.9–10.3)
Chloride: 100 mmol/L (ref 98–111)
Creatinine, Ser: 2.35 mg/dL — ABNORMAL HIGH (ref 0.61–1.24)
GFR calc Af Amer: 31 mL/min — ABNORMAL LOW (ref >60)
GFR, EST NON AFRICAN AMERICAN: 27 mL/min — AB (ref >60)
Glucose, Bld: 110 mg/dL — ABNORMAL HIGH (ref 70–99)
Potassium: 3.5 mmol/L (ref 3.5–5.1)
SODIUM: 142 mmol/L (ref 135–145)

## 2018-08-31 LAB — CBC WITH DIFFERENTIAL/PLATELET
Abs Immature Granulocytes: 0.08 10*3/uL — ABNORMAL HIGH (ref 0.00–0.07)
BASOS PCT: 0 %
Basophils Absolute: 0 10*3/uL (ref 0.0–0.1)
Eosinophils Absolute: 0.1 10*3/uL (ref 0.0–0.5)
Eosinophils Relative: 1 %
HCT: 43.4 % (ref 39.0–52.0)
HEMOGLOBIN: 12.3 g/dL — AB (ref 13.0–17.0)
Immature Granulocytes: 1 %
LYMPHS PCT: 15 %
Lymphs Abs: 1.9 10*3/uL (ref 0.7–4.0)
MCH: 23.3 pg — AB (ref 26.0–34.0)
MCHC: 28.3 g/dL — AB (ref 30.0–36.0)
MCV: 82.2 fL (ref 80.0–100.0)
MONO ABS: 1.1 10*3/uL — AB (ref 0.1–1.0)
Monocytes Relative: 9 %
Neutro Abs: 9.2 10*3/uL — ABNORMAL HIGH (ref 1.7–7.7)
Neutrophils Relative %: 74 %
Platelets: 226 10*3/uL (ref 150–400)
RBC: 5.28 MIL/uL (ref 4.22–5.81)
RDW: 17.7 % — ABNORMAL HIGH (ref 11.5–15.5)
WBC: 12.3 10*3/uL — AB (ref 4.0–10.5)
nRBC: 0 % (ref 0.0–0.2)

## 2018-08-31 LAB — GLUCOSE, CAPILLARY: Glucose-Capillary: 155 mg/dL — ABNORMAL HIGH (ref 70–99)

## 2018-08-31 LAB — I-STAT VENOUS BLOOD GAS, ED
Acid-Base Excess: 10 mmol/L — ABNORMAL HIGH (ref 0.0–2.0)
BICARBONATE: 36 mmol/L — AB (ref 20.0–28.0)
O2 SAT: 81 %
PCO2 VEN: 51.4 mmHg (ref 44.0–60.0)
PO2 VEN: 44 mmHg (ref 32.0–45.0)
TCO2: 38 mmol/L — AB (ref 22–32)
pH, Ven: 7.453 — ABNORMAL HIGH (ref 7.250–7.430)

## 2018-08-31 LAB — I-STAT TROPONIN, ED: TROPONIN I, POC: 0.01 ng/mL (ref 0.00–0.08)

## 2018-08-31 LAB — BRAIN NATRIURETIC PEPTIDE: B Natriuretic Peptide: 151 pg/mL — ABNORMAL HIGH (ref 0.0–100.0)

## 2018-08-31 MED ORDER — FEBUXOSTAT 40 MG PO TABS
80.0000 mg | ORAL_TABLET | Freq: Every day | ORAL | Status: DC
  Administered 2018-09-01 – 2018-09-08 (×8): 80 mg via ORAL
  Filled 2018-08-31 (×8): qty 2

## 2018-08-31 MED ORDER — VITAMIN D 25 MCG (1000 UNIT) PO TABS
1000.0000 [IU] | ORAL_TABLET | Freq: Every day | ORAL | Status: DC
  Administered 2018-09-01 – 2018-09-08 (×8): 1000 [IU] via ORAL
  Filled 2018-08-31 (×10): qty 1

## 2018-08-31 MED ORDER — FAMOTIDINE 20 MG PO TABS
20.0000 mg | ORAL_TABLET | Freq: Every day | ORAL | Status: DC
  Administered 2018-08-31 – 2018-09-08 (×9): 20 mg via ORAL
  Filled 2018-08-31 (×9): qty 1

## 2018-08-31 MED ORDER — INSULIN ASPART 100 UNIT/ML ~~LOC~~ SOLN
0.0000 [IU] | Freq: Three times a day (TID) | SUBCUTANEOUS | Status: DC
  Administered 2018-09-01: 3 [IU] via SUBCUTANEOUS
  Administered 2018-09-02: 2 [IU] via SUBCUTANEOUS
  Administered 2018-09-02 – 2018-09-03 (×5): 3 [IU] via SUBCUTANEOUS
  Administered 2018-09-04 (×2): 5 [IU] via SUBCUTANEOUS
  Administered 2018-09-04 – 2018-09-06 (×4): 3 [IU] via SUBCUTANEOUS
  Administered 2018-09-06: 2 [IU] via SUBCUTANEOUS
  Administered 2018-09-07: 3 [IU] via SUBCUTANEOUS
  Administered 2018-09-07: 2 [IU] via SUBCUTANEOUS
  Administered 2018-09-07: 5 [IU] via SUBCUTANEOUS
  Administered 2018-09-08 (×2): 3 [IU] via SUBCUTANEOUS

## 2018-08-31 MED ORDER — ASPIRIN EC 325 MG PO TBEC: 325.0000 mg | DELAYED_RELEASE_TABLET | Freq: Every day | ORAL | Status: DC

## 2018-08-31 MED ORDER — INSULIN GLARGINE 100 UNIT/ML ~~LOC~~ SOLN
20.0000 [IU] | Freq: Every day | SUBCUTANEOUS | Status: DC
  Administered 2018-08-31 – 2018-09-07 (×8): 20 [IU] via SUBCUTANEOUS
  Filled 2018-08-31 (×9): qty 0.2

## 2018-08-31 MED ORDER — POLYETHYLENE GLYCOL 3350 17 G PO PACK: 17.0000 g | PACK | Freq: Every day | ORAL | Status: DC | PRN

## 2018-08-31 MED ORDER — METOPROLOL SUCCINATE ER 50 MG PO TB24
50.0000 mg | ORAL_TABLET | Freq: Every day | ORAL | Status: DC
  Filled 2018-08-31: qty 1

## 2018-08-31 MED ORDER — FUROSEMIDE 10 MG/ML IJ SOLN
80.0000 mg | Freq: Two times a day (BID) | INTRAMUSCULAR | Status: DC
  Administered 2018-08-31 – 2018-09-03 (×6): 80 mg via INTRAVENOUS
  Filled 2018-08-31 (×6): qty 8

## 2018-08-31 MED ORDER — GABAPENTIN 300 MG PO CAPS
300.0000 mg | ORAL_CAPSULE | Freq: Two times a day (BID) | ORAL | Status: DC
  Administered 2018-08-31 – 2018-09-08 (×16): 300 mg via ORAL
  Filled 2018-08-31 (×16): qty 1

## 2018-08-31 MED ORDER — HEPARIN SODIUM (PORCINE) 5000 UNIT/ML IJ SOLN
5000.0000 [IU] | Freq: Three times a day (TID) | INTRAMUSCULAR | Status: DC
  Administered 2018-08-31 – 2018-09-08 (×23): 5000 [IU] via SUBCUTANEOUS
  Filled 2018-08-31 (×22): qty 1

## 2018-08-31 MED ORDER — ATORVASTATIN CALCIUM 40 MG PO TABS
40.0000 mg | ORAL_TABLET | Freq: Every day | ORAL | Status: DC
  Administered 2018-08-31 – 2018-09-08 (×9): 40 mg via ORAL
  Filled 2018-08-31 (×9): qty 1

## 2018-08-31 MED ORDER — ONDANSETRON HCL 4 MG/2ML IJ SOLN: 4.0000 mg | Freq: Four times a day (QID) | INTRAMUSCULAR | Status: DC | PRN

## 2018-08-31 MED ORDER — METHYLCELLULOSE 1 % OP SOLN: 1.0000 [drp] | Freq: Two times a day (BID) | OPHTHALMIC | Status: DC | PRN

## 2018-08-31 MED ORDER — METOPROLOL SUCCINATE ER 50 MG PO TB24
50.0000 mg | ORAL_TABLET | Freq: Every day | ORAL | Status: DC
  Administered 2018-09-01 – 2018-09-08 (×8): 50 mg via ORAL
  Filled 2018-08-31 (×8): qty 1

## 2018-08-31 MED ORDER — ACETAMINOPHEN 325 MG PO TABS: 650.0000 mg | ORAL_TABLET | Freq: Four times a day (QID) | ORAL | Status: DC | PRN

## 2018-08-31 MED ORDER — ACETAMINOPHEN ER 650 MG PO TBCR: 1300.0000 mg | EXTENDED_RELEASE_TABLET | Freq: Two times a day (BID) | ORAL | Status: DC

## 2018-08-31 MED ORDER — SENNOSIDES-DOCUSATE SODIUM 8.6-50 MG PO TABS
1.0000 | ORAL_TABLET | Freq: Two times a day (BID) | ORAL | Status: DC
  Administered 2018-08-31 – 2018-09-08 (×16): 1 via ORAL
  Filled 2018-08-31 (×16): qty 1

## 2018-08-31 MED ORDER — MELATONIN 3 MG PO TABS
3.0000 mg | ORAL_TABLET | Freq: Every evening | ORAL | Status: DC | PRN
  Administered 2018-08-31 – 2018-09-02 (×2): 3 mg via ORAL
  Filled 2018-08-31 (×3): qty 1

## 2018-08-31 MED ORDER — ASPIRIN EC 325 MG PO TBEC
325.0000 mg | DELAYED_RELEASE_TABLET | Freq: Every day | ORAL | Status: DC
  Administered 2018-09-01 – 2018-09-04 (×4): 325 mg via ORAL
  Filled 2018-08-31 (×4): qty 1

## 2018-08-31 MED ORDER — ACETAMINOPHEN 650 MG RE SUPP: 650.0000 mg | Freq: Four times a day (QID) | RECTAL | Status: DC | PRN

## 2018-08-31 MED ORDER — POLYVINYL ALCOHOL 1.4 % OP SOLN: 1.0000 [drp] | OPHTHALMIC | Status: DC | PRN

## 2018-08-31 MED ORDER — AMLODIPINE BESYLATE 10 MG PO TABS
10.0000 mg | ORAL_TABLET | Freq: Every day | ORAL | Status: DC
  Administered 2018-09-01 – 2018-09-08 (×8): 10 mg via ORAL
  Filled 2018-08-31 (×8): qty 1

## 2018-08-31 MED ORDER — HYDRALAZINE HCL 50 MG PO TABS
50.0000 mg | ORAL_TABLET | Freq: Three times a day (TID) | ORAL | Status: DC
  Administered 2018-08-31 – 2018-09-08 (×19): 50 mg via ORAL
  Filled 2018-08-31 (×21): qty 1

## 2018-08-31 MED ORDER — ONDANSETRON HCL 4 MG PO TABS: 4.0000 mg | ORAL_TABLET | Freq: Four times a day (QID) | ORAL | Status: DC | PRN

## 2018-08-31 NOTE — ED Notes (Signed)
Investigating if we have a larger walker to accommodate this pt.

## 2018-08-31 NOTE — ED Provider Notes (Signed)
[facility] EMERGENCY DEPARTMENT Provider Note   CSN: [id number] Arrival date & time: 08/31/18  1324     History   Chief Complaint Chief Complaint  Patient presents with  . Shortness of Breath    HPI [name] is a 73 y.o. male with a past medical history of type 2 diabetes, CAD, CHF, atrial fibrillation, hypertension, blindness, presents to ED for dyspnea on exertion.  Patient resides at home but was at [facility] when [facility] was called.  Staff states that he has dyspnea on exertion at baseline.  However, they were ambulating him and noticed worsening of his symptoms with decrease in oxygen saturations to 85%.  Patient has PRN supplemental oxygen which he states he wears at night along with his CPAP.  States that he does not usually need it during the day when he ambulates.  He reports a dry cough.  Denies any changes to appetite, chest pain, fever, sick contacts.  He does note improvement with 2 duo nebs and 80 mg of IM Lasix given at facility.  He notes compliance with his home medications.  HPI  Past Medical History:  Diagnosis Date  . Arthritis    "all over" (06/23/2013)  . Atrial fibrillation ([place])    not Candidate for Coumadin d/t rectal bleeding  . Benign prostatic hypertrophy   . Cardiomyopathy, hypertensive ([place])     diastolic dysfunction by 7253 echo  . Coronary artery disease     nonobstructive. 2 vessel. cardiac catheter in March 2005 showing 60% LAD occlusion, 30% circumflex occlusion.  . Degenerative joint disease     Right knee.  . Diabetes type 2, uncontrolled ([place])   . Diabetic peripheral neuropathy ([place])   . Diabetic ulcer of thigh ([place])    H/O Rt thigh ulcer.  . Exertional shortness of breath    "comes and goes" (06/23/2013)  . GERD (gastroesophageal reflux disease)   . Gout   . Herpes   . Hyperlipidemia   . Hypertension   . Hypotension 09/05/2013  . Incarcerated ventral hernia    H/O. S/P repair ventral hernia repair  07/2005.  Marland Kitchen LBBB (left bundle branch block)   . Legally blind   . Morbid obesity ([place])   . Obstructive sleep apnea    On CPAP. Last sleep study (06/2009) - Severe obstructive sleep apnea/hypopnea syndrome, apnea-hypopnea index 70.5 per hour with non positional events moderately loud snoring and oxygen desaturation to a nadir of 85% on room air.  . Occult blood positive stool     History of in August 2007.  colonoscopy in January 2008 showing normal colon with fair inadequate prep. By Dr. Silvano Rusk.  . 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 Sepsis ([place]) 08/26/2013    Patient Active Problem List   Diagnosis Date Noted  . Heart failure ([place]) 08/31/2018  . Paroxysmal atrial tachycardia ([place]) 05/12/2018  . Aortic valve stenosis, nonrheumatic 05/12/2018  . Diabetes mellitus type 2 in obese ([place]) 05/12/2018  . Chronic diastolic heart failure ([place]) 11/11/2017  . Hypernatremia 08/15/2017  . AKI (acute kidney injury) ([place]) 08/15/2017  . Acute lower UTI 08/15/2017  . Coarse tremors 08/15/2017  . Amiodarone pulmonary toxicity   . Cellulitis   . Acute encephalopathy   . Acute pulmonary edema (HCC)   . Acute respiratory failure with hypoxia ([place]) 06/13/2017  . SSS (sick sinus syndrome) ([place]) 05/19/2016  . Cough 08/20/2015  . Morbid obesity ([place]) 07/09/2015  . Cardiac mass  10/04/2013  . CVA (cerebrovascular accident due to intracerebral hemorrhage) ([place]) 10/02/2013  . CVA (cerebral infarction) 10/01/2013  . Intracerebral hemorrhage ([place]) 10/01/2013  . ICH (intracerebral hemorrhage) ([place]) 10/01/2013  . Right knee pain 09/07/2013  . CKD (chronic kidney disease) stage 3, GFR 30-59 ml/min (HCC) 09/07/2013  . Orchitis 09/05/2013  . Unspecified protein-calorie malnutrition ([place]) 09/05/2013  . Sepsis ([place]) 08/27/2013  . Trichomonas infection 08/27/2013  . Epididymo-orchitis, acute 08/27/2013  . PAF (paroxysmal atrial fibrillation) ([place]) 06/24/2013  . OSA (obstructive sleep apnea)  05/04/2011  . Shortness of breath 03/17/2011  . Depression 10/23/2010  . Degenerative joint disease   . Cardiomyopathy, hypertensive ([place])   . Coronary artery disease involving native coronary artery of native heart without angina pectoris   . Pacemaker   . Incarcerated ventral hernia   . COPD (chronic obstructive pulmonary disease) ([place]) 11/28/2009  . Diabetes mellitus due to underlying condition, controlled, with stage 3 chronic kidney disease, with long-term current use of insulin ([place]) 07/20/2006  . Hypercholesterolemia 07/20/2006  . GOUT 07/20/2006  . MACULAR DEGENERATION 07/20/2006  . Essential hypertension 07/20/2006  . BENIGN PROSTATIC HYPERTROPHY, HX OF 07/20/2006    Past Surgical History:  Procedure Laterality Date  . AMPUTATION Left 06/22/2013   Procedure: AMPUTATION FOOT;  Surgeon: Newt Minion, MD;  Location: [place] [facility];  Service: Orthopedics;  Laterality: Left;  Left Midfoot Amputation  . CARDIAC CATHETERIZATION  10/22/2003   Dilatation of the LV with preserved LV systolic function. Tortous aorta, but no AAA. Moderate disease with 50-60% area of narrowing in the circumflex after the second OM and 70% mid narrowing in the LAD. No intervention.  Marland Kitchen CARDIOVASCULAR STRESS TEST  01/23/2010   Perfusion defect seen in inferior myocardial region is consistent with diaphragmatic attenuation. Remaining myocardium demonstrates normal myocardial perfusion with no evidence of ischemia or infarct. Stress ECG was non-diagnostic due to LBBB.  Marland Kitchen CATARACT EXTRACTION, BILATERAL Bilateral   . ESOPHAGOGASTRODUODENOSCOPY N/A 10/12/2013   Procedure: ESOPHAGOGASTRODUODENOSCOPY (EGD);  Surgeon: Gwenyth Ober, MD;  Location: [facility];  Service: General;  Laterality: N/A;  bedside  . EYE SURGERY    . 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 HERNIA REPAIR    . INSERT / REPLACE / REMOVE PACEMAKER  11/13/2003   Secondary to symptomatic bradycardia. DDDR pacemaker. By Dr.  Rollene Fare.  Randolm Idol / REPLACE / REMOVE PACEMAKER  2012-11-12   "old pacemaker died; had new one put in" (2013-07-01)  . Pacemaker lead revision  12/2003    likely microdislodgment  of right ventricular lead  . PEG PLACEMENT N/A 10/12/2013   Procedure: PERCUTANEOUS ENDOSCOPIC GASTROSTOMY (PEG) PLACEMENT;  Surgeon: Gwenyth Ober, MD;  Location: [facility];  Service: General;  Laterality: N/A;  . PERMANENT PACEMAKER GENERATOR CHANGE N/A 11/03/2012   Procedure: PERMANENT PACEMAKER GENERATOR CHANGE;  Surgeon: Sanda Klein, MD;  Location: [facility] CATH LAB;  Service: Cardiovascular;  Laterality: N/A;  . REFRACTIVE SURGERY Bilateral   . TRANSTHORACIC ECHOCARDIOGRAM  01/01/2013   EF 50-55%, ventricular septum-thickness moderately increased  . VENOUS DOPPLER  01/17/2013   No evidence of thrombus or thrombophlebitis. Left GSVs appear enlarged and demonstrate valvular insuffiiciency. Bilateral SSVs appeared patent with no valvular insufficiency seen.  Marland Kitchen VENTRAL HERNIA REPAIR  07/2005    S/P laparotomy, lysis of adhesions, repair of incarcerated ventral hernia with  partial omentectomy.. Performed by Dr. Marlou Starks.        Home Medications    Prior to Admission medications  Medication Sig Start Date End Date Taking? Authorizing Provider  acetaminophen (TYLENOL) 650 MG CR tablet Take 1,300 mg by mouth 2 (two) times daily.     [provider]  amLODipine (NORVASC) 10 MG tablet Take 10 mg by mouth daily. Reported on 12/09/2015    [provider]  aspirin 325 MG tablet Take 325 mg by mouth daily.    [provider]  atorvastatin (LIPITOR) 40 MG tablet Take 1 tablet (40 mg total) by mouth daily. 04/16/15   Croitoru, Mihai, MD  cholecalciferol (VITAMIN D) 1000 units tablet Take 1,000 Units by mouth daily.    [provider]  Febuxostat (ULORIC) 80 MG TABS Take 1 tablet by mouth daily.    [provider]  gabapentin (NEURONTIN) 300 MG capsule Take 300 mg by mouth 2 (two) times  daily.    [provider]  hydrALAZINE (APRESOLINE) 50 MG tablet Take 1 tablet (50 mg total) 3 (three) times daily by mouth. 06/24/17   Ina Homes, MD  Insulin Glargine (TOUJEO SOLOSTAR) 300 UNIT/ML SOPN Inject 45 Units into the skin 2 (two) times daily.     [provider]  lidocaine (LIDODERM) 5 % Place 1 patch onto the skin daily as needed (back pain). Remove & Discard patch within 12 hours or as directed by MD    [provider]  Liraglutide (VICTOZA) 18 MG/3ML SOPN Inject 1.8 mg into the skin daily.    [provider]  Melatonin 3 MG CAPS Take 1 capsule by mouth at bedtime as needed (sleep).     [provider]  Menthol, Topical Analgesic, (BIOFREEZE EX) Apply 1 application topically 3 (three) times daily as needed (pain). Reported on 10/27/2015    [provider]  methylcellulose (ARTIFICIAL TEARS) 1 % ophthalmic solution Place 1 drop into both eyes 2 (two) times daily as needed (dry eyes).     [provider]  metolazone (ZAROXOLYN) 2.5 MG tablet Take 2.5 mg by mouth daily. 5 mg one Mondays and Thursdays, 2.5 mg every other day    [provider]  metoprolol succinate (TOPROL-XL) 50 MG 24 hr tablet Take 1 tablet (50 mg total) by mouth daily. Take with or immediately following a meal. 03/21/18   Larey Dresser, MD  polyethylene glycol [facility] Floria Raveling) packet Take 17 g by mouth daily as needed for moderate constipation.    [provider]  potassium chloride SA (K-DUR,KLOR-CON) 20 MEQ tablet Take 20 mEq by mouth 2 (two) times daily. 60 MEQ in the AM 40 meq mid-day 60 meq in the PM    [provider]  ranitidine (ZANTAC) 150 MG tablet Take 150 mg by mouth daily.     [provider]  sennosides-docusate sodium (SENOKOT-S) 8.6-50 MG tablet Take 1 tablet by mouth 2 (two) times daily.     [provider]  Skin Protectants, Misc. (EUCERIN) cream Apply 1 application topically 2 (two) times  daily.    [provider]  torsemide (DEMADEX) 20 MG tablet Take 6 tablets (120 mg total) by mouth 2 (two) times daily. 03/23/18   Larey Dresser, MD    Family History Family History  Problem Relation Age of Onset  . Coronary artery disease Sister   . Heart disease Sister   . Allergies Sister   . Asthma Brother   . Rheum arthritis Brother     Social History Social History   Tobacco Use  . Smoking status: Former Smoker  Packs/day: 0.00    Years: 0.00    Pack years: 0.00    Types: Cigarettes    Last attempt to quit: 08/17/2003    Years since quitting: 15.0  . Smokeless tobacco: Never Used  . Tobacco comment: started at age 6.  only smoked on occ- very rarely.0  Substance Use Topics  . Alcohol use: Yes    Comment: 06/23/2013 "drank some; never had a problem w/it"  . Drug use: No     Allergies   Patient has no known allergies.   Review of Systems Review of Systems  Constitutional: Negative for appetite change, chills and fever.  HENT: Negative for ear pain, rhinorrhea, sneezing and sore throat.   Eyes: Negative for photophobia and visual disturbance.  Respiratory: Positive for cough, shortness of breath and wheezing. Negative for chest tightness.   Cardiovascular: Positive for leg swelling. Negative for chest pain and palpitations.  Gastrointestinal: Negative for abdominal pain, blood in stool, constipation, diarrhea, nausea and vomiting.  Genitourinary: Negative for dysuria, hematuria and urgency.  Musculoskeletal: Negative for myalgias.  Skin: Negative for rash.  Neurological: Negative for dizziness, weakness and light-headedness.     Physical Exam Updated Vital Signs BP (!) 152/89   Pulse 62   Temp 98.5 F (36.9 C) (Oral)   Resp 20   Ht 5\' 11"  (1.803 m)   Wt (!) 153.8 kg   SpO2 98%   BMI 47.28 kg/m   Physical Exam Vitals signs and nursing note reviewed.  Constitutional:      General: He is not in acute distress.    Appearance: He is  well-developed.     Comments: 4L of oxygen being delivered via nasal cannula.  HENT:     Head: Normocephalic and atraumatic.     Nose: Nose normal.  Eyes:     General: No scleral icterus.       Left eye: No discharge.     Conjunctiva/sclera: Conjunctivae normal.  Neck:     Musculoskeletal: Normal range of motion and neck supple.  Cardiovascular:     Rate and Rhythm: Normal rate and regular rhythm.     Heart sounds: Normal heart sounds. No murmur. No friction rub. No gallop.   Pulmonary:     Effort: Pulmonary effort is normal. Tachypnea present. No respiratory distress.     Breath sounds: Normal breath sounds.  Abdominal:     General: Bowel sounds are normal. There is no distension.     Palpations: Abdomen is soft.     Tenderness: There is no abdominal tenderness. There is no guarding.  Musculoskeletal: Normal range of motion.     Comments: 2+ pitting edema in bilateral lower extremities. Chronic venous stasis noted. All digits amputated on L foot.  Skin:    General: Skin is warm and dry.     Findings: No rash.  Neurological:     Mental Status: He is alert.     Motor: No abnormal muscle tone.     Coordination: Coordination normal.      ED Treatments / Results  Labs (all labs ordered are listed, but only abnormal results are displayed) Labs Reviewed  BASIC METABOLIC PANEL - Abnormal; Notable for the following components:      Result Value   Glucose, Bld 110 (*)    BUN 63 (*)    Creatinine, Ser 2.35 (*)    GFR calc non Af Amer 27 (*)    GFR calc Af Amer 31 (*)    All other  components within normal limits  CBC WITH DIFFERENTIAL/PLATELET - Abnormal; Notable for the following components:   WBC 12.3 (*)    Hemoglobin 12.3 (*)    MCH 23.3 (*)    MCHC 28.3 (*)    RDW 17.7 (*)    Neutro Abs 9.2 (*)    Monocytes Absolute 1.1 (*)    Abs Immature Granulocytes 0.08 (*)    All other components within normal limits  BRAIN NATRIURETIC PEPTIDE - Abnormal; Notable for the  following components:   B Natriuretic Peptide 151.0 (*)    All other components within normal limits  I-STAT VENOUS BLOOD GAS, ED - Abnormal; Notable for the following components:   pH, Ven 7.453 (*)    Bicarbonate 36.0 (*)    TCO2 38 (*)    Acid-Base Excess 10.0 (*)    All other components within normal limits  I-STAT TROPONIN, ED    EKG EKG Interpretation  Date/Time:  Thursday August 31 2018 13:43:48 EST Ventricular Rate:  75 PR Interval:    QRS Duration: 107 QT Interval:  406 QTC Calculation: 628 R Axis:   -28 Text Interpretation:  Sinus rhythm Probable anterior infarct, age indeterminate Confirmed by Isla Pence ([id number]) on 08/31/2018 2:13:25 PM   Radiology Dg Chest 2 View  Result Date: 08/31/2018 CLINICAL DATA:  Severe shortness of breath EXAM: CHEST - 2 VIEW COMPARISON:  08/21/2018. FINDINGS: Cardiac pacer with lead tips over the right atrium right ventricle. Stable cardiomegaly with improving pulmonary venous congestion. Previously identified pulmonary interstitial prominence has cleared. Mild basilar atelectasis. No pleural effusion. No pneumothorax. No acute bony abnormality. IMPRESSION: 1.  Cardiac pacer s in table position. 2. Stable cardiomegaly with mild pulmonary venous congestion. Pulmonary venous congestion has improved from prior exam. Previously identified interstitial edema has cleared. 3.  Bibasilar atelectasis. Electronically Signed   By: Marcello Moores  Register   On: 08/31/2018 15:28    Procedures Procedures (including critical care time)  Medications Ordered in ED Medications - No data to display   Initial Impression / Assessment and Plan / ED Course  I have reviewed the triage vital signs and the nursing notes.  Pertinent labs & imaging results that were available during my care of the patient were reviewed by me and considered in my medical decision making (see chart for details).  Clinical Course as of Aug 31 1753  Thu Aug 31, 2018  1406 B  Natriuretic Peptide(!): 151.0 [HK]  1406 Creatinine(!): 2.35 [HK]  1751 pH, Ven(!): 7.453 [HK]  7420 73 year old male with a past medical history of atrial fibrillation, CHF, CAD, presents to ED for increased dyspnea on exertion since this morning.  [facility] when his oxygen saturations dropped to 85% on room air.  States that when he sits, he feels fine.  However, when he ambulates he begins feeling more short of breath.  He does not wear oxygen at home during the daytime.  He denies any chest pain, URI symptoms or fever.  Patient was given 2 duo nebs and 80 mg of IM Lasix prior to EMS evaluation. He has lower extremity edema and erythema which appears to be chronic.  He is on 4 L of oxygen with O2 saturations in 98%.  BNP mildly elevated to 151.  Chest x-ray shows improved vascular congestion compared to last week.  Patient ambulated here with drop in oxygen saturations to 87%.  He will need to be admitted for further work-up.  Hospitalist to admit.   [  HK]    Clinical Course User Index [HK] Delia Heady, PA-C    Patient discussed with and seen by my attending, Dr. Gilford Raid.    Portions of this note were generated with Lobbyist. Dictation errors may occur despite best attempts at proofreading.   Final Clinical Impressions(s) / ED Diagnoses   Final diagnoses:  Acute on chronic congestive heart failure, unspecified heart failure type [facility])  Dyspnea, unspecified type    ED Discharge Orders    None       Delia Heady, PA-C 08/31/18 1755    Isla Pence, MD 09/05/18 1302

## 2018-08-31 NOTE — H&P (Addendum)
History and Physical    [name] ASN:[id number] DOB: Apr 12, 1946 DOA: 08/31/2018  PCP: [name], MD   Patient coming from: Home   Chief Complaint: dyspnea.   HPI: [name] is a 73 y.o. male with medical history significant of chronic diastolic heart failure, paroxysmal atrial fibrillation, hypertensive cardiomyopathy, coronary artery disease, chronic kidney disease, morbid obesity, type 2 diabetes mellitus and obstructive sleep apnea.  He also has blindness, severe peripheral neuropathy and dyslipidemia.   Patient goes to [facility] for [facility] medical care.  He reports developing dyspnea for the last 2 weeks which has been progressive and worsening, to the point where he is dyspneic with minimal efforts, today at [facility] he was found hypoxic.  His symptoms seem to be worse with exertion, no improving factors, it has been associated with PND, orthopnea and lower extremity edema.  He is on torsemide for diuresis, he goes to [facility] 3 [facility], and he has an aide who helps him with his activities of daily living, there is question about how compliant is he  with his salt and fluids restricted diet.  His functional physical capacity is very low, he uses a cane for ambulation.   ED Course: Patient received furosemide IM as an outpatient, he continued to be dyspneic in the emergency department, his oximetry saturation on ambulation was down to 87%.  Review of Systems:  1. General: No fevers, no chills, no weight gain or weight loss 2. ENT: No runny nose or sore throat, no hearing disturbances 3. Pulmonary: Positive dyspnea, cough, wheezing, or hemoptysis 4. Cardiovascular: No angina, claudication, positive lower extremity edema, pnd and orthopnea 5. Gastrointestinal: No nausea or vomiting, no diarrhea or constipation 6. Hematology: No easy bruisability or frequent infections 7. Urology: No dysuria, hematuria or increased urinary frequency 8. Dermatology: No  rashes. 9. Neurology: No seizures or paresthesias 10. Musculoskeletal: No joint pain or deformities  Past Medical History:  Diagnosis Date  . Arthritis    "all over" (06/23/2013)  . Atrial fibrillation ([place])    not Candidate for Coumadin d/t rectal bleeding  . Benign prostatic hypertrophy   . Cardiomyopathy, hypertensive ([place])     diastolic dysfunction by 1937 echo  . Coronary artery disease     nonobstructive. 2 vessel. cardiac catheter in March 2005 showing 60% LAD occlusion, 30% circumflex occlusion.  . Degenerative joint disease     Right knee.  . Diabetes type 2, uncontrolled ([place])   . Diabetic peripheral neuropathy ([place])   . Diabetic ulcer of thigh ([place])    H/O Rt thigh ulcer.  . Exertional shortness of breath    "comes and goes" (06/23/2013)  . GERD (gastroesophageal reflux disease)   . Gout   . Herpes   . Hyperlipidemia   . Hypertension   . Hypotension 09/05/2013  . Incarcerated ventral hernia    H/O. S/P repair ventral hernia repair 07/2005.  Marland Kitchen LBBB (left bundle branch block)   . Legally blind   . Morbid obesity ([place])   . Obstructive sleep apnea    On CPAP. Last sleep study (06/2009) - Severe obstructive sleep apnea/hypopnea syndrome, apnea-hypopnea index 70.5 per hour with non positional events moderately loud snoring and oxygen desaturation to a nadir of 85% on room air.  . Occult blood positive stool     History of in August 2007.  colonoscopy in January 2008 showing normal colon with fair inadequate prep. By Dr. Silvano Rusk.  . 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 Sepsis ([place]) 08/26/2013    Past Surgical History:  Procedure Laterality Date  . AMPUTATION Left 06/22/2013   Procedure: AMPUTATION FOOT;  Surgeon: Newt Minion, MD;  Location: [facility];  Service: Orthopedics;  Laterality: Left;  Left Midfoot Amputation  . CARDIAC CATHETERIZATION  10/22/2003   Dilatation of the LV with preserved LV systolic function. Tortous aorta, but no AAA. Moderate disease  with 50-60% area of narrowing in the circumflex after the second OM and 70% mid narrowing in the LAD. No intervention.  Marland Kitchen CARDIOVASCULAR STRESS TEST  01/23/2010   Perfusion defect seen in inferior myocardial region is consistent with diaphragmatic attenuation. Remaining myocardium demonstrates normal myocardial perfusion with no evidence of ischemia or infarct. Stress ECG was non-diagnostic due to LBBB.  Marland Kitchen CATARACT EXTRACTION, BILATERAL Bilateral   . ESOPHAGOGASTRODUODENOSCOPY N/A 10/12/2013   Procedure: ESOPHAGOGASTRODUODENOSCOPY (EGD);  Surgeon: Gwenyth Ober, MD;  Location: [facility];  Service: General;  Laterality: N/A;  bedside  . EYE SURGERY    . 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 HERNIA REPAIR    . INSERT / REPLACE / REMOVE PACEMAKER  11-22-03   Secondary to symptomatic bradycardia. DDDR pacemaker. By Dr. Rollene Fare.  Randolm Idol / REPLACE / REMOVE PACEMAKER  2012-11-21   "old pacemaker died; had new one put in" (10-Jul-2013)  . Pacemaker lead revision  12/2003    likely microdislodgment  of right ventricular lead  . PEG PLACEMENT N/A 10/12/2013   Procedure: PERCUTANEOUS ENDOSCOPIC GASTROSTOMY (PEG) PLACEMENT;  Surgeon: Gwenyth Ober, MD;  Location: [facility];  Service: General;  Laterality: N/A;  . PERMANENT PACEMAKER GENERATOR CHANGE N/A 11/03/2012   Procedure: PERMANENT PACEMAKER GENERATOR CHANGE;  Surgeon: Sanda Klein, MD;  Location: [facility];  Service: Cardiovascular;  Laterality: N/A;  . REFRACTIVE SURGERY Bilateral   . TRANSTHORACIC ECHOCARDIOGRAM  01/01/2013   EF 50-55%, ventricular septum-thickness moderately increased  . VENOUS DOPPLER  01/17/2013   No evidence of thrombus or thrombophlebitis. Left GSVs appear enlarged and demonstrate valvular insuffiiciency. Bilateral SSVs appeared patent with no valvular insufficiency seen.  Marland Kitchen VENTRAL HERNIA REPAIR  07/2005    S/P laparotomy, lysis of adhesions, repair of incarcerated ventral hernia  with  partial omentectomy.. Performed by Dr. Marlou Starks.     reports that he quit smoking about 15 years ago. His smoking use included cigarettes. He smoked 0.00 packs per day for 0.00 years. He has never used smokeless tobacco. He reports current alcohol use. He reports that he does not use drugs.  No Known Allergies  Family History  Problem Relation Age of Onset  . Coronary artery disease Sister   . Heart disease Sister   . Allergies Sister   . Asthma Brother   . Rheum arthritis Brother      Prior to Admission medications   Medication Sig Start Date End Date Taking? Authorizing Provider  acetaminophen (TYLENOL) 650 MG CR tablet Take 1,300 mg by mouth 2 (two) times daily.     [provider]  amLODipine (NORVASC) 10 MG tablet Take 10 mg by mouth daily. Reported on 12/09/2015    [provider]  aspirin 325 MG tablet Take 325 mg by mouth daily.    [provider]  atorvastatin (LIPITOR) 40 MG tablet Take 1 tablet (40 mg total) by mouth daily. 04/16/15   Croitoru, Mihai, MD  cholecalciferol (VITAMIN D) 1000 units tablet Take 1,000 Units by mouth daily.    [provider]  Febuxostat (ULORIC) 80 MG TABS Take 1 tablet by mouth daily.    [provider]  gabapentin (NEURONTIN) 300 MG capsule Take 300 mg by mouth 2 (two) times daily.    [provider]  hydrALAZINE (APRESOLINE) 50 MG tablet Take 1 tablet (50 mg total) 3 (three) times daily by mouth. 06/24/17   Ina Homes, MD  Insulin Glargine (TOUJEO SOLOSTAR) 300 UNIT/ML SOPN Inject 45 Units into the skin 2 (two) times daily.     [provider]  lidocaine (LIDODERM) 5 % Place 1 patch onto the skin daily as needed (back pain). Remove & Discard patch within 12 hours or as directed by MD    [provider]  Liraglutide (VICTOZA) 18 MG/3ML SOPN Inject 1.8 mg into the skin daily.    [provider]  Melatonin 3 MG CAPS Take 1 capsule by mouth at bedtime as needed  (sleep).     [provider]  Menthol, Topical Analgesic, (BIOFREEZE EX) Apply 1 application topically 3 (three) times daily as needed (pain). Reported on 10/27/2015    [provider]  methylcellulose (ARTIFICIAL TEARS) 1 % ophthalmic solution Place 1 drop into both eyes 2 (two) times daily as needed (dry eyes).     [provider]  metolazone (ZAROXOLYN) 2.5 MG tablet Take 2.5 mg by mouth daily. 5 mg one Mondays and Thursdays, 2.5 mg every other day    [provider]  metoprolol succinate (TOPROL-XL) 50 MG 24 hr tablet Take 1 tablet (50 mg total) by mouth daily. Take with or immediately following a meal. 03/21/18   Larey Dresser, MD  polyethylene glycol [facility] / Floria Raveling) packet Take 17 g by mouth daily as needed for moderate constipation.    [provider]  potassium chloride SA (K-DUR,KLOR-CON) 20 MEQ tablet Take 20 mEq by mouth 2 (two) times daily. 60 MEQ in the AM 40 meq mid-day 60 meq in the PM    [provider]  ranitidine (ZANTAC) 150 MG tablet Take 150 mg by mouth daily.     [provider]  sennosides-docusate sodium (SENOKOT-S) 8.6-50 MG tablet Take 1 tablet by mouth 2 (two) times daily.     [provider]  Skin Protectants, Misc. (EUCERIN) cream Apply 1 application topically 2 (two) times daily.    [provider]  torsemide (DEMADEX) 20 MG tablet Take 6 tablets (120 mg total) by mouth 2 (two) times daily. 03/23/18   Larey Dresser, MD    Physical Exam: Vitals:   08/31/18 1515 08/31/18 1530 08/31/18 1545 08/31/18 1600  BP: (!) 162/91 (!) 150/89 (!) 160/96 (!) 152/89  Pulse: 78 73 67 62  Resp: 16 18 19 20   Temp:      TempSrc:      SpO2: 94% 98% 98% 98%  Weight:      Height:        Vitals:   08/31/18 1515 08/31/18 1530 08/31/18 1545 08/31/18 1600  BP: (!) 162/91 (!) 150/89 (!) 160/96 (!) 152/89  Pulse: 78 73 67 62  Resp: 16 18 19 20   Temp:      TempSrc:      SpO2: 94% 98% 98% 98%    Weight:      Height:       General: deconditioned and ill looking appearing  Neurology: Awake and alert, non focal Head and Neck. Head normocephalic. Neck supple with no adenopathy or thyromegaly.   E ENT: no pallor, no icterus,  oral mucosa moist Cardiovascular: Positive JVD. S1-S2 present, rhythmic, no gallops, rubs. Positive  Murmur 3/6 systolic at the right upper sternal border with no radiaton. ++++ pitting lower extremity edema bilaterally. Pulmonary: decreased breath sounds bilaterally, poor air movement, no wheezing, scattered rhonchi and rales. Gastrointestinal. Abdomen protuberant with no organomegaly, non tender, no rebound or guarding Skin. No rashes Musculoskeletal: no joint deformities    Labs on Admission: I have personally reviewed following labs and imaging studies  CBC: Recent Labs  Lab 08/31/18 1406  WBC 12.3*  NEUTROABS 9.2*  HGB 12.3*  HCT 43.4  MCV 82.2  PLT 324   Basic Metabolic Panel: Recent Labs  Lab 08/31/18 1406  NA 142  K 3.5  CL 100  CO2 29  GLUCOSE 110*  BUN 63*  CREATININE 2.35*  CALCIUM 9.4   GFR: Estimated Creatinine Clearance: 44.1 mL/min (A) (by C-G formula based on SCr of 2.35 mg/dL (H)). Liver Function Tests: No results for input(s): AST, ALT, ALKPHOS, BILITOT, PROT, ALBUMIN in the last 168 hours. No results for input(s): LIPASE, AMYLASE in the last 168 hours. No results for input(s): AMMONIA in the last 168 hours. Coagulation Profile: No results for input(s): INR, PROTIME in the last 168 hours. Cardiac Enzymes: No results for input(s): CKTOTAL, CKMB, CKMBINDEX, TROPONINI in the last 168 hours. BNP (last 3 results) No results for input(s): PROBNP in the last 8760 hours. HbA1C: No results for input(s): HGBA1C in the last 72 hours. CBG: No results for input(s): GLUCAP in the last 168 hours. Lipid Profile: No results for input(s): CHOL, HDL, LDLCALC, TRIG, CHOLHDL, LDLDIRECT in the last 72 hours. Thyroid Function Tests: No  results for input(s): TSH, T4TOTAL, FREET4, T3FREE, THYROIDAB in the last 72 hours. Anemia Panel: No results for input(s): VITAMINB12, FOLATE, FERRITIN, TIBC, IRON, RETICCTPCT in the last 72 hours. Urine analysis:    Component Value Date/Time   COLORURINE YELLOW 08/15/2017 1527   APPEARANCEUR HAZY (A) 08/15/2017 1527   LABSPEC 1.010 08/15/2017 1527   PHURINE 7.0 08/15/2017 1527   GLUCOSEU NEGATIVE 08/15/2017 1527   GLUCOSEU > 1000 mg/dL (A) 10/30/2009 0142   HGBUR NEGATIVE 08/15/2017 1527   HGBUR trace-lysed 03/25/2010 1416   BILIRUBINUR NEGATIVE 08/15/2017 [place] 08/15/2017 1527   PROTEINUR NEGATIVE 08/15/2017 1527   UROBILINOGEN 0.2 11/04/2013 1944   NITRITE NEGATIVE 08/15/2017 1527   LEUKOCYTESUR LARGE (A) 08/15/2017 1527    Radiological Exams on Admission: Dg Chest 2 View  Result Date: 08/31/2018 CLINICAL DATA:  Severe shortness of breath EXAM: CHEST - 2 VIEW COMPARISON:  08/21/2018. FINDINGS: Cardiac pacer with lead tips over the right atrium right ventricle. Stable cardiomegaly with improving pulmonary venous congestion. Previously identified pulmonary interstitial prominence has cleared. Mild basilar atelectasis. No pleural effusion. No pneumothorax. No acute bony abnormality. IMPRESSION: 1.  Cardiac pacer s in table position. 2. Stable cardiomegaly with mild pulmonary venous congestion. Pulmonary venous congestion has improved from prior exam. Previously identified interstitial edema has cleared. 3.  Bibasilar atelectasis. Electronically Signed   By: Marcello Moores  Register   On: 08/31/2018 15:28    EKG: Independently reviewed.  EKG sinus rhythm, left axis deviation, poor R wave progression.  Assessment/Plan Active Problems:   Heart failure [facility])  73 year old male who has significant medical history for diastolic heart failure due to hypertensive cardiomyopathy, complicated by morbid obesity, diabetes, chronic kidney disease and obstructive sleep apnea.  For the  last 2 weeks developing worsening and progressive dyspnea to the point where he was  found hypoxic.  He does have a very poor functional physical capacity and there is significant concerns about his compliance with his heart failure regimen.  On his initial physical examination his temperature 98.5, blood pressure 147/84, heart rate 75, respiratory rate 24, oxygen saturation 98% on supplemental oxygen per nasal cannula, on room air sitting 93%, ambulating 87%. He had positive JVD, Rales and rhonchi bilaterally, he does have a systolic murmur at the right upper sternal border, his abdomen is protuberant, he does have ++++ pitting bilateral strength edema.  Sodium 142, potassium 3.5, chloride 100, bicarb 29, glucose 110, BUN 63, creatinine 2.35, BNP 151, white count 12.3, hemoglobin 12.3, hematocrit 43.4, platelets 226, venous blood gas pH 7.45.  Chest x-ray has cardiomegaly, increase in central markings bilaterally, pacemaker on the right side.   Patient will be admitted to the hospital with a working diagnosis of acute hypoxic respiratory failure due to acute cardiogenic pulmonary edema due to decompensated acute on chronic diastolic heart failure.  1.  Acute hypoxic respiratory failure due to cardiogenic pulmonary edema due to diastolic heart failure exacerbation acute on chronic.  Patient will be admitted to the telemetry unit, he will be placed on aggressive diuresis with IV furosemide 80 mg intravenously twice daily, will target a negative fluid balance, continue close follow-up of urine output along with electrolytes and kidney function.  Continue beta-blockade with metoprolol, afterload reduction with hydralazine.  Continue supplemental oxygen per nasal cannula to target O2 saturation 92%, continue oximetry monitoring.    2. Moderate aortic stenosis. His last echocardiography from August 2019 with left ventricle ejection fraction 60 to 65%.  Moderate aortic stenosis with a valve area of 1 cm2, mean  gradient of 29 mmHg.   3.  Hypertension.  Continue aggressive diuresis, blood pressure control with amlodipine, hydralazine and metoprolol.  4.  Paroxysmal atrial fibrillation.  Currently sinus rhythm, continue rate control with metoprolol, patient not on anticoagulation due to history of hemorrhagic stroke, personally reviewed old records.  Continue full dose aspirin.  5.  Type 2 diabetes mellitus.  At home patient on glargine 45 units twice daily along with Victoza.  During his hospitalization will reduce the dose of glargine to 20 units to avoid hypoglycemia, his admission glucose is 110.  Continue sliding scale for glucose coverage and monitoring.  6.  History of CVA.  Continue statin, aspirin, neurochecks per protocol, physical therapy evaluation.  7.  Morbid obesity, complicated by obstructive sleep apnea.  Complicated by pulmonary hypertension.  Continue CPAP while hospitalized.  Peak PA pressure 45 mmHg.  Echocardiography  8. AKI on CKD stage 3. Will continue diuresis with furosemide, will follow on renal panel in am, likely cardiorenal syndrome.   DVT prophylaxis: heparin  Code Status: full  Family Communication: I spoke with patient's family at the bedside and all questions were addressed.   Disposition Plan: home    Consults called: none   Admission status: Inpatient.     Vernal Rutan Gerome Apley MD [facility] Pager [phone]  If 7PM-7AM, please contact night-coverage [email or web address] Password [facility]  08/31/2018, 5:54 PM

## 2018-08-31 NOTE — Progress Notes (Signed)
Patient at this time no longer needs PIV access

## 2018-08-31 NOTE — ED Notes (Signed)
Called pt dtr Valarie Cones [phone]. Left vm.

## 2018-08-31 NOTE — ED Triage Notes (Signed)
Pt from home, but at [facility], hx of DOE, CHF, COPD, but worse today, uses O2 prn at night, placed on 4 lpm O2 96%, given 2 duonebs last one at 1300, also given 80 mg of lasix at [facility].

## 2018-08-31 NOTE — ED Notes (Signed)
Pt O2 beginning at 93% sitting and lying. Pt ambulated without O2  With a drop to 87% and maintained will ambulating.

## 2018-08-31 NOTE — ED Notes (Signed)
Dr Cathlean Sauer text paged regarding the need to change bed request for this pt due to the dx of heart failure.

## 2018-09-01 ENCOUNTER — Inpatient Hospital Stay (HOSPITAL_COMMUNITY): Payer: Medicare (Managed Care)

## 2018-09-01 ENCOUNTER — Other Ambulatory Visit: Payer: Self-pay

## 2018-09-01 ENCOUNTER — Other Ambulatory Visit (HOSPITAL_COMMUNITY): Payer: Medicare (Managed Care)

## 2018-09-01 DIAGNOSIS — N179 Acute kidney failure, unspecified: Secondary | ICD-10-CM

## 2018-09-01 DIAGNOSIS — E1122 Type 2 diabetes mellitus with diabetic chronic kidney disease: Secondary | ICD-10-CM

## 2018-09-01 DIAGNOSIS — R0609 Other forms of dyspnea: Secondary | ICD-10-CM

## 2018-09-01 LAB — BASIC METABOLIC PANEL
Anion gap: 13 (ref 5–15)
BUN: 66 mg/dL — ABNORMAL HIGH (ref 8–23)
CALCIUM: 8.8 mg/dL — AB (ref 8.9–10.3)
CO2: 28 mmol/L (ref 22–32)
Chloride: 102 mmol/L (ref 98–111)
Creatinine, Ser: 2.35 mg/dL — ABNORMAL HIGH (ref 0.61–1.24)
GFR calc Af Amer: 31 mL/min — ABNORMAL LOW (ref >60)
GFR calc non Af Amer: 27 mL/min — ABNORMAL LOW (ref >60)
Glucose, Bld: 70 mg/dL (ref 70–99)
Potassium: 3.6 mmol/L (ref 3.5–5.1)
Sodium: 143 mmol/L (ref 135–145)

## 2018-09-01 LAB — GLUCOSE, CAPILLARY
GLUCOSE-CAPILLARY: 182 mg/dL — AB (ref 70–99)
Glucose-Capillary: 82 mg/dL (ref 70–99)
Glucose-Capillary: 109 mg/dL — ABNORMAL HIGH (ref 70–99)
Glucose-Capillary: 166 mg/dL — ABNORMAL HIGH (ref 70–99)

## 2018-09-01 LAB — HIV ANTIBODY (ROUTINE TESTING W REFLEX): HIV Screen 4th Generation wRfx: NONREACTIVE

## 2018-09-01 NOTE — Evaluation (Signed)
Physical Therapy Evaluation Patient Details Name: Davaris Youtsey MRN: [id number] DOB: 05/01/1946 Today's Date: 09/01/2018   History of Present Illness  Patient is a 73 year old male admitted with SOB. [facility] and has PCA for 5 hours per day 7 days a week. PMH to include: legally blind, transmet amputation, CKD, obesity, DM, HTN, CAD, PN.      Clinical Impression  Patient sitting on side of bed upon entering room. Patient leaning to right side. Reports he is breathing better. Patient agrees to PT evaluation. Patient able to ambulate 100 feet with RW and constant verbal cues and occasional tactile cues for direction and safety when walking in hallway. Patient became very SOB after about 80 feet having increased difficulty getting back to room. Sats after walking on room air down to 82%. After sitting and resting with pursed lip breathing sats rose to 91% on room air. Patient will benefit from skilled PT to address his functional limitations and decreased activity tolerance for return home.        Follow Up Recommendations Home health PT    Equipment Recommendations  None recommended by PT    Recommendations for Other Services       Precautions / Restrictions Precautions Precautions: Fall Restrictions Weight Bearing Restrictions: No      Mobility  Bed Mobility               General bed mobility comments: not assesed, patient received sitting on side of bed  Transfers Overall transfer level: Needs assistance Equipment used: Rolling walker (2 wheeled) Transfers: Sit to/from Stand Sit to Stand: Min guard            Ambulation/Gait Ambulation/Gait assistance: Modified independent (Device/Increase time);Min assist Gait Distance (Feet): 100 Feet Assistive device: Rolling walker (2 wheeled) Gait Pattern/deviations: Step-to pattern Gait velocity: WNL   General Gait Details: requires constant Verbal and occasional tactile cues for steering RW and  direction due to blindness.   Stairs            Wheelchair Mobility    Modified Rankin (Stroke Patients Only)       Balance Overall balance assessment: Needs assistance Sitting-balance support: Feet supported;Single extremity supported Sitting balance-Leahy Scale: Fair     Standing balance support: Bilateral upper extremity supported Standing balance-Leahy Scale: Fair                               Pertinent Vitals/Pain Pain Assessment: No/denies pain    Home Living Family/patient expects to be discharged to:: Private residence Living Arrangements: Alone   Type of Home: House Home Access: Ramped entrance     Home Layout: One level Home Equipment: Environmental consultant - 4 wheels;Bedside commode;Shower seat Additional Comments: PCA daily for several hours, [facility]week    Prior Function Level of Independence: Needs assistance   Gait / Transfers Assistance Needed: ambulates at home with cane or RW  ADL's / Homemaking Assistance Needed: PCA to assist with ADLS, IADLS  Comments: patient blind     Hand Dominance   Dominant Hand: Right    Extremity/Trunk Assessment   Upper Extremity Assessment Upper Extremity Assessment: Overall WFL for tasks assessed;Generalized weakness    Lower Extremity Assessment Lower Extremity Assessment: Overall WFL for tasks assessed;Generalized weakness       Communication   Communication: No difficulties  Cognition Arousal/Alertness: Awake/alert Behavior During Therapy: WFL for tasks assessed/performed Overall Cognitive Status: Within  Functional Limits for tasks assessed                                        General Comments      Exercises     Assessment/Plan    PT Assessment Patient needs continued PT services  PT Problem List Decreased strength;Decreased mobility;Decreased safety awareness;Decreased activity tolerance;Decreased balance;Cardiopulmonary status limiting activity;Obesity       PT  Treatment Interventions Gait training;Therapeutic activities;Therapeutic exercise;Balance training;Functional mobility training;Patient/family education    PT Goals (Current goals can be found in the Care Plan section)  Acute Rehab PT Goals Patient Stated Goal: to return home when ready PT Goal Formulation: With patient Time For Goal Achievement: 09/15/18 Potential to Achieve Goals: Good    Frequency Min 3X/week   Barriers to discharge        Co-evaluation               AM-PAC PT "6 Clicks" Mobility  Outcome Measure Help needed turning from your back to your side while in a flat bed without using bedrails?: A Lot Help needed moving from lying on your back to sitting on the side of a flat bed without using bedrails?: A Lot Help needed moving to and from a bed to a chair (including a wheelchair)?: A Little Help needed standing up from a chair using your arms (e.g., wheelchair or bedside chair)?: A Little Help needed to walk in hospital room?: A Little Help needed climbing 3-5 steps with a railing? : A Lot 6 Click Score: 15    End of Session Equipment Utilized During Treatment: Gait belt Activity Tolerance: Patient tolerated treatment well;Other (comment)(limited by SOB) Patient left: in bed;with call bell/phone within reach Nurse Communication: Mobility status PT Visit Diagnosis: Other abnormalities of gait and mobility (R26.89);Other (comment);Muscle weakness (generalized) (M62.81)(decreased activity tolerance due to SOB)    Time: 7048-8891 PT Time Calculation (min) (ACUTE ONLY): 25 min   Charges:   PT Evaluation $PT Eval Moderate Complexity: 1 Mod PT Treatments $Gait Training: 8-22 mins        Duaa Stelzner, PT, GCS 09/01/18,10:53 AM

## 2018-09-01 NOTE — Progress Notes (Signed)
  Echocardiogram 2D Echocardiogram has been performed.  [name] 09/01/2018, 5:48 PM

## 2018-09-01 NOTE — Plan of Care (Signed)
Pt is alert and oriented X4. Legally blind. Accompanied by family members with ED staff. Skin warm and dry. Lower extremities stasis ulcer, discolored areas with few blisters. Dyspnea on exertion. Call bell within reach. Will continue monitoring

## 2018-09-01 NOTE — Progress Notes (Signed)
PROGRESS NOTE    [name]  VQQ:[id number] DOB: 06-30-1946 DOA: 08/31/2018 PCP: [name], MD    Brief Narrative:  73 year old male who has significant medical history for diastolic heart failure due to hypertensive cardiomyopathy, complicated by morbid obesity, diabetes, chronic kidney disease and obstructive sleep apnea.  For the last 2 weeks developing worsening and progressive dyspnea to the point where he was found hypoxic.  He does have a very poor functional physical capacity and there is significant concerns about his compliance with his heart failure regimen.  On his initial physical examination his temperature 98.5, blood pressure 147/84, heart rate 75, respiratory rate 24, oxygen saturation 98% on supplemental oxygen per nasal cannula, on room air sitting 93%, ambulating 87%. He had positive JVD, Rales and rhonchi bilaterally, he does have a systolic murmur at the right upper sternal border, his abdomen is protuberant, he does have ++++ pitting bilateral strength edema.  Sodium 142, potassium 3.5, chloride 100, bicarb 29, glucose 110, BUN 63, creatinine 2.35, BNP 151, white count 12.3, hemoglobin 12.3, hematocrit 43.4, platelets 226, venous blood gas pH 7.45.  Chest x-ray has cardiomegaly, increase in central markings bilaterally, pacemaker on the right side.   Patient will be admitted to the hospital with a working diagnosis of acute hypoxic respiratory failure due to acute cardiogenic pulmonary edema due to decompensated acute on chronic diastolic heart failure.   Assessment & Plan:   Active Problems:   Heart failure ([place])   1. Acute on chronic diastolic heart failure exacerbation. Patient continue to be hypervolemic with lower extremity edema, jvd and rales, will continue aggressive diuresis with IV furosemide 80 mg q12, urine output over last 24 H, 2,200 ml. Will continue heart failure management with metoprolol. Will add fluid restriction.   2. Acute hypoxic  respiratory failure due to acute cardiogenic pulmonary edema. Will continue diuresis IV to target negative fluid balance, will continue supplemental 02 per [place] and oxymetry monitoring.   3. Moderate aortic stenosis. Last echocardiogram from 08.2019, will repeat study while in the hospital, continue diuresis for volume overload.   4. Paroxysmal atrial fibrillation. Continue rate control with metoprolol and anticoagulation with aspirin. Not candidate for full anticoagulation due to intracranial hemorrhage.    5. T2DM. Will continue glucose control and monitoring with insulin sliding scale. Patient is tolerating po well. Continue basal insulin with 20 units of glargine. Fasting glucose today at 70, with capillary glucose 155, 82, 109.   6. Hx of CVA. Continue neuro checks per unit protocol, will need home health at discharge. Will continue aspirin and statin therapy.   7. Morbid obesity and osa. Tolerating cpap well, will need close follow up as outpatient.   8. AKI on CKD stage 3. Renal function with serum cr at 2,35 with K at 3,6 and serum bicarbonate at 28, will continue to follow on renal panel in am, avoid hypotension and nephrotoxic medications.   9. HTN. Continue blood pressure control with amlodipine.   DVT prophylaxis: heparin   Code Status:  full Family Communication: no family at the bedside  Disposition Plan/ discharge barriers: pending clinical improvement   Body mass index is 45.43 kg/m. Malnutrition Type:      Malnutrition Characteristics:      Nutrition Interventions:     RN Pressure Injury Documentation:     Consultants:     Procedures:     Antimicrobials:       Subjective: Patient reports improved dyspnea but still not back to baseline, persistent  lower extremity edema and dyspnea. No nausea or vomiting, tolerating well c pap.   Objective: Vitals:   09/01/18 0007 09/01/18 0520 09/01/18 0522 09/01/18 1043  BP: 131/75 135/79    Pulse: 73 67      Resp: 18 18    Temp: 98.5 F (36.9 C)  97.7 F (36.5 C)   TempSrc: Oral  Oral   SpO2: 92% 94%  91%  Weight:   (!) 147.7 kg   Height:        Intake/Output Summary (Last 24 hours) at 09/01/2018 1122 Last data filed at 09/01/2018 1102 Gross per 24 hour  Intake 1980 ml  Output 2575 ml  Net -595 ml   Filed Weights   08/31/18 1344 09/01/18 0522  Weight: (!) 153.8 kg (!) 147.7 kg    Examination:   General: deconditioned  Neurology: somnolent but easy to arouse, non focal  E ENT: no pallor, no icterus, oral mucosa moist Cardiovascular: positive JVD. S1-S2 present, rhythmic, no gallops, rubs, positive systolic 3.6 murmur at the right sternal border. ++/+++ pitting lower extremity edema. Pulmonary: decreased breath sounds bilaterally, adequate air movement, no wheezing, rhonchi or rales. Gastrointestinal. Abdomen protuberant with no organomegaly, non tender, no rebound or guarding Skin. No rashes Musculoskeletal: no joint deformities     Data Reviewed: I have personally reviewed following labs and imaging studies  CBC: Recent Labs  Lab 08/31/18 1406  WBC 12.3*  NEUTROABS 9.2*  HGB 12.3*  HCT 43.4  MCV 82.2  PLT 664   Basic Metabolic Panel: Recent Labs  Lab 08/31/18 1406 09/01/18 0454  NA 142 143  K 3.5 3.6  CL 100 102  CO2 29 28  GLUCOSE 110* 70  BUN 63* 66*  CREATININE 2.35* 2.35*  CALCIUM 9.4 8.8*   GFR: Estimated Creatinine Clearance: 43.2 mL/min (A) (by C-G formula based on SCr of 2.35 mg/dL (H)). Liver Function Tests: No results for input(s): AST, ALT, ALKPHOS, BILITOT, PROT, ALBUMIN in the last 168 hours. No results for input(s): LIPASE, AMYLASE in the last 168 hours. No results for input(s): AMMONIA in the last 168 hours. Coagulation Profile: No results for input(s): INR, PROTIME in the last 168 hours. Cardiac Enzymes: No results for input(s): CKTOTAL, CKMB, CKMBINDEX, TROPONINI in the last 168 hours. BNP (last 3 results) No results for input(s):  PROBNP in the last 8760 hours. HbA1C: No results for input(s): HGBA1C in the last 72 hours. CBG: Recent Labs  Lab 08/31/18 2025 09/01/18 0751  GLUCAP 155* 82   Lipid Profile: No results for input(s): CHOL, HDL, LDLCALC, TRIG, CHOLHDL, LDLDIRECT in the last 72 hours. Thyroid Function Tests: No results for input(s): TSH, T4TOTAL, FREET4, T3FREE, THYROIDAB in the last 72 hours. Anemia Panel: No results for input(s): VITAMINB12, FOLATE, FERRITIN, TIBC, IRON, RETICCTPCT in the last 72 hours.    Radiology Studies: I have reviewed all of the imaging during this hospital visit personally     Scheduled Meds: . amLODipine  10 mg Oral Daily  . aspirin EC  325 mg Oral Daily  . atorvastatin  40 mg Oral Daily  . cholecalciferol  1,000 Units Oral Daily  . famotidine  20 mg Oral Daily  . febuxostat  80 mg Oral Daily  . furosemide  80 mg Intravenous Q12H  . gabapentin  300 mg Oral BID  . heparin  5,000 Units Subcutaneous Q8H  . hydrALAZINE  50 mg Oral TID  . insulin aspart  0-15 Units Subcutaneous TID WC  . insulin glargine  20 Units Subcutaneous QHS  . metoprolol succinate  50 mg Oral Daily  . senna-docusate  1 tablet Oral BID   Continuous Infusions:   LOS: 1 day        Cassady Turano Gerome Apley, MD [facility] Pager [phone]

## 2018-09-02 DIAGNOSIS — Z794 Long term (current) use of insulin: Secondary | ICD-10-CM

## 2018-09-02 DIAGNOSIS — J449 Chronic obstructive pulmonary disease, unspecified: Secondary | ICD-10-CM

## 2018-09-02 DIAGNOSIS — E7800 Pure hypercholesterolemia, unspecified: Secondary | ICD-10-CM

## 2018-09-02 DIAGNOSIS — E0822 Diabetes mellitus due to underlying condition with diabetic chronic kidney disease: Secondary | ICD-10-CM

## 2018-09-02 LAB — ECHOCARDIOGRAM COMPLETE
Height: 71 in
Weight: 5211.2 oz

## 2018-09-02 LAB — GLUCOSE, CAPILLARY
Glucose-Capillary: 134 mg/dL — ABNORMAL HIGH (ref 70–99)
Glucose-Capillary: 172 mg/dL — ABNORMAL HIGH (ref 70–99)
Glucose-Capillary: 161 mg/dL — ABNORMAL HIGH (ref 70–99)
Glucose-Capillary: 186 mg/dL — ABNORMAL HIGH (ref 70–99)

## 2018-09-02 LAB — BASIC METABOLIC PANEL
Anion gap: 13 (ref 5–15)
BUN: 66 mg/dL — AB (ref 8–23)
CO2: 32 mmol/L (ref 22–32)
CREATININE: 2.42 mg/dL — AB (ref 0.61–1.24)
Calcium: 8.7 mg/dL — ABNORMAL LOW (ref 8.9–10.3)
Chloride: 96 mmol/L — ABNORMAL LOW (ref 98–111)
GFR calc Af Amer: 30 mL/min — ABNORMAL LOW (ref >60)
GFR calc non Af Amer: 26 mL/min — ABNORMAL LOW (ref >60)
Glucose, Bld: 117 mg/dL — ABNORMAL HIGH (ref 70–99)
Potassium: 3 mmol/L — ABNORMAL LOW (ref 3.5–5.1)
Sodium: 141 mmol/L (ref 135–145)

## 2018-09-02 MED ORDER — POTASSIUM CHLORIDE CRYS ER 20 MEQ PO TBCR
40.0000 meq | EXTENDED_RELEASE_TABLET | Freq: Once | ORAL | Status: AC
  Administered 2018-09-02: 40 meq via ORAL
  Filled 2018-09-02: qty 2

## 2018-09-02 NOTE — Progress Notes (Signed)
PROGRESS NOTE    [name]  [id number]:[id number] DOB: 1946/01/26 DOA: 08/31/2018 PCP: [name], MD    Brief Narrative:  73 year old male who has significant medical history for diastolic heart failure due to hypertensive cardiomyopathy, complicated by morbid obesity, diabetes, chronic kidney disease and obstructive sleep apnea. For the last 2 weeks developing worsening and progressive dyspnea to the point where he was found hypoxic.He does have a very poor functional physical capacity and there is significant concerns about his compliance with his heart failure regimen. On his initial physical examination his temperature 98.5, blood pressure 147/84, heart rate 75, respiratory rate 24, oxygen saturation 98%on supplemental oxygen per nasal cannula, on room air sitting 93%, ambulating 87%. He had positive JVD, Rales and rhonchi bilaterally, he does have a systolic murmur at the right upper sternal border, his abdomen is protuberant, he does have++++pitting bilateral strength edema.Sodium 142, potassium 3.5, chloride 100, bicarb 29, glucose 110, BUN 63, creatinine 2.35, BNP 151, white count 12.3, hemoglobin 12.3, hematocrit 43.4, platelets 226, venous blood gas pH 7.45.Chest x-ray has cardiomegaly, increase in central markings bilaterally, pacemaker on the right side.  Patient will be admitted to the hospital with a working diagnosis of acute hypoxic respiratory failure due to acute cardiogenic pulmonary edema due to decompensated acuteonchronic diastolic heart failure.   Assessment & Plan:   Active Problems:   Diabetes mellitus due to underlying condition, controlled, with stage 3 chronic kidney disease, with long-term current use of insulin (HCC)   Hypercholesterolemia   Essential hypertension   COPD (chronic obstructive pulmonary disease) (HCC)   Coronary artery disease involving native coronary artery of native heart without angina pectoris   OSA (obstructive sleep  apnea)   PAF (paroxysmal atrial fibrillation) (HCC)   CKD (chronic kidney disease) stage 3, GFR 30-59 ml/min (HCC)   Heart failure ([place])  1. Acute on chronic diastolic heart failure exacerbation. This am with improved symptoms, but not yet back to baseline, responding well to diuresis with IV furosemide, urine output over last 24 H, 2,975 ml, will continue to target negative fluid balance. Systolic blood pressure systolic continue to be stable at 130 to 138 mmHg. Continue heart failure management with hydralazine and metoprolol. Echocardiography with LV systolic function 60 to 79%.   2. Acute hypoxic respiratory failure due to acute cardiogenic pulmonary edema. Improved oxygenation, today oxymetry up to 95% on room air. Will check ambulatory oxymetry on ambulation before discharge.   3. Moderate, aortic stenosis. Echocardiogram with aortic valve area of 1,32 with a mean of 1,23. Will continue diuresis. Reported area parameters similar to prior echocardiogram (moderate AS).   4. Paroxysmal atrial fibrillation/ had prior history of intracranial hemmorrhage. Rate controlled with metoprolol and continue with asa for anticoagulation.   5. T2DM. Fasting glucose today at 117, will continue glucose coverage and monitoring with insulin sliding scale. On basal insulin with 20 units of glargine, lower than his home dose.   6. Hx of CVA. On asa and statin, will need home health at discharge.  7. Morbid obesity and osa.cpap at night with good toleration.  8. AKI on CKD stage 3. Stable renal function with serum cr at 2,42 with K at 3,0 and serum bicarbonate at 32. Will correct K with Kcl, follow on renal panel in am.   9. HTN. On amlodipine for blood pressure control.   DVT prophylaxis: heparin   Code Status:  full Family Communication: no family at the bedside  Disposition Plan/ discharge barriers: pending clinical improvement  Body mass index is 45.85 kg/m. Malnutrition Type:       Malnutrition Characteristics:      Nutrition Interventions:     RN Pressure Injury Documentation:     Consultants:     Procedures:     Antimicrobials:       Subjective: Patient this am is out of the bed to the chair, his symptoms have improved but not back to baseline, continue to have lower extremity edema, and dyspnea on exertion. No chest pain, no nausea or vomiting.   Objective: Vitals:   09/02/18 0610 09/02/18 0930 09/02/18 1129 09/02/18 1203  BP:  (!) 143/73 138/82 130/76  Pulse:  72  72  Resp:    (!) 22  Temp:    97.8 F (36.6 C)  TempSrc:    Oral  SpO2:    95%  Weight: (!) 149.1 kg     Height:        Intake/Output Summary (Last 24 hours) at 09/02/2018 1321 Last data filed at 09/02/2018 1212 Gross per 24 hour  Intake 1080 ml  Output 2600 ml  Net -1520 ml   Filed Weights   08/31/18 1344 09/01/18 0522 09/02/18 0610  Weight: (!) 153.8 kg (!) 147.7 kg (!) 149.1 kg    Examination:   General: deconditioned  Neurology: Awake and alert, non focal  E ENT: mild pallor, no icterus, oral mucosa moist Cardiovascular: No JVD. S1-S2 present, rhythmic, no gallops, rubs, or murmurs. No lower extremity edema. Pulmonary: decresed breath sounds bilaterally, adequate air movement, no wheezing, rhonchi or rales. Gastrointestinal. Abdomen protuberant with no organomegaly, non tender, no rebound or guarding Skin. No rashes Musculoskeletal: no joint deformities     Data Reviewed: I have personally reviewed following labs and imaging studies  CBC: Recent Labs  Lab 08/31/18 1406  WBC 12.3*  NEUTROABS 9.2*  HGB 12.3*  HCT 43.4  MCV 82.2  PLT 947   Basic Metabolic Panel: Recent Labs  Lab 08/31/18 1406 09/01/18 0454 09/02/18 0532  NA 142 143 141  K 3.5 3.6 3.0*  CL 100 102 96*  CO2 29 28 32  GLUCOSE 110* 70 117*  BUN 63* 66* 66*  CREATININE 2.35* 2.35* 2.42*  CALCIUM 9.4 8.8* 8.7*   GFR: Estimated Creatinine Clearance: 42.1 mL/min (A) (by  C-G formula based on SCr of 2.42 mg/dL (H)). Liver Function Tests: No results for input(s): AST, ALT, ALKPHOS, BILITOT, PROT, ALBUMIN in the last 168 hours. No results for input(s): LIPASE, AMYLASE in the last 168 hours. No results for input(s): AMMONIA in the last 168 hours. Coagulation Profile: No results for input(s): INR, PROTIME in the last 168 hours. Cardiac Enzymes: No results for input(s): CKTOTAL, CKMB, CKMBINDEX, TROPONINI in the last 168 hours. BNP (last 3 results) No results for input(s): PROBNP in the last 8760 hours. HbA1C: No results for input(s): HGBA1C in the last 72 hours. CBG: Recent Labs  Lab 09/01/18 1216 09/01/18 1723 09/01/18 2115 09/02/18 0756 09/02/18 1159  GLUCAP 109* 166* 182* 134* 172*   Lipid Profile: No results for input(s): CHOL, HDL, LDLCALC, TRIG, CHOLHDL, LDLDIRECT in the last 72 hours. Thyroid Function Tests: No results for input(s): TSH, T4TOTAL, FREET4, T3FREE, THYROIDAB in the last 72 hours. Anemia Panel: No results for input(s): VITAMINB12, FOLATE, FERRITIN, TIBC, IRON, RETICCTPCT in the last 72 hours.    Radiology Studies: I have reviewed all of the imaging during this hospital visit personally     Scheduled Meds: . amLODipine  10 mg Oral Daily  .  aspirin EC  325 mg Oral Daily  . atorvastatin  40 mg Oral Daily  . cholecalciferol  1,000 Units Oral Daily  . famotidine  20 mg Oral Daily  . febuxostat  80 mg Oral Daily  . furosemide  80 mg Intravenous Q12H  . gabapentin  300 mg Oral BID  . heparin  5,000 Units Subcutaneous Q8H  . hydrALAZINE  50 mg Oral TID  . insulin aspart  0-15 Units Subcutaneous TID WC  . insulin glargine  20 Units Subcutaneous QHS  . metoprolol succinate  50 mg Oral Daily  . potassium chloride  40 mEq Oral Once  . senna-docusate  1 tablet Oral BID   Continuous Infusions:   LOS: 2 days        Loel Betancur Gerome Apley, MD [facility] Pager [phone]

## 2018-09-02 NOTE — Progress Notes (Signed)
CCMD states tele is presenting "oddly". Pt assessed; denies complaints.

## 2018-09-02 NOTE — Progress Notes (Signed)
Patient sleeping during shift report.      

## 2018-09-03 DIAGNOSIS — I2510 Atherosclerotic heart disease of native coronary artery without angina pectoris: Secondary | ICD-10-CM

## 2018-09-03 LAB — BASIC METABOLIC PANEL
Anion gap: 11 (ref 5–15)
BUN: 70 mg/dL — ABNORMAL HIGH (ref 8–23)
CO2: 31 mmol/L (ref 22–32)
Calcium: 8.5 mg/dL — ABNORMAL LOW (ref 8.9–10.3)
Chloride: 96 mmol/L — ABNORMAL LOW (ref 98–111)
Creatinine, Ser: 2.52 mg/dL — ABNORMAL HIGH (ref 0.61–1.24)
GFR calc Af Amer: 29 mL/min — ABNORMAL LOW (ref >60)
GFR calc non Af Amer: 25 mL/min — ABNORMAL LOW (ref >60)
Glucose, Bld: 150 mg/dL — ABNORMAL HIGH (ref 70–99)
Potassium: 3.3 mmol/L — ABNORMAL LOW (ref 3.5–5.1)
Sodium: 138 mmol/L (ref 135–145)

## 2018-09-03 LAB — GLUCOSE, CAPILLARY
Glucose-Capillary: 155 mg/dL — ABNORMAL HIGH (ref 70–99)
Glucose-Capillary: 185 mg/dL — ABNORMAL HIGH (ref 70–99)
Glucose-Capillary: 190 mg/dL — ABNORMAL HIGH (ref 70–99)

## 2018-09-03 MED ORDER — TORSEMIDE 20 MG PO TABS
40.0000 mg | ORAL_TABLET | Freq: Every day | ORAL | Status: DC
  Filled 2018-09-03: qty 2

## 2018-09-03 MED ORDER — POTASSIUM CHLORIDE CRYS ER 20 MEQ PO TBCR
40.0000 meq | EXTENDED_RELEASE_TABLET | Freq: Once | ORAL | Status: AC
  Administered 2018-09-03: 40 meq via ORAL

## 2018-09-03 NOTE — Progress Notes (Signed)
PROGRESS NOTE    [name]  VQM:[id number] DOB: August 22, 1945 DOA: 08/31/2018 PCP: [name], MD    Brief Narrative:  73 year old male who has significant medical history for diastolic heart failure due to hypertensive cardiomyopathy, complicated by morbid obesity, diabetes, chronic kidney disease and obstructive sleep apnea. For the last 2 weeks developing worsening and progressive dyspnea to the point where he was found hypoxic.He does have a very poor functional physical capacity and there is significant concerns about his compliance with his heart failure regimen. On his initial physical examination his temperature 98.5, blood pressure 147/84, heart rate 75, respiratory rate 24, oxygen saturation 98%on supplemental oxygen per nasal cannula, on room air sitting 93%, ambulating 87%. He had positive JVD, Rales and rhonchi bilaterally, he does have a systolic murmur at the right upper sternal border, his abdomen is protuberant, he does have++++pitting bilateral strength edema.Sodium 142, potassium 3.5, chloride 100, bicarb 29, glucose 110, BUN 63, creatinine 2.35, BNP 151, white count 12.3, hemoglobin 12.3, hematocrit 43.4, platelets 226, venous blood gas pH 7.45.Chest x-ray has cardiomegaly, increase in central markings bilaterally, pacemaker on the right side.  Patient will be admitted to the hospital with a working diagnosis of acute hypoxic respiratory failure due to acute cardiogenic pulmonary edema due to decompensated acuteonchronic diastolic heart failure.   Assessment & Plan:   Active Problems:   Diabetes mellitus due to underlying condition, controlled, with stage 3 chronic kidney disease, with long-term current use of insulin (HCC)   Hypercholesterolemia   Essential hypertension   COPD (chronic obstructive pulmonary disease) (HCC)   Coronary artery disease involving native coronary artery of native heart without angina pectoris   OSA (obstructive sleep  apnea)   PAF (paroxysmal atrial fibrillation) (HCC)   CKD (chronic kidney disease) stage 3, GFR 30-59 ml/min (HCC)   Heart failure ([place])   1. Acute on chronic diastolic heart failure exacerbation/ Echocardiography with LV systolic function 60 to 93%..His volume status continue to improve, not yet back to baseline, will continue with IV furosemide today and will change to torsemide in am, patient on 120 mg daily plus metolazone at home, questionable compliance. Will continue heart failure management with hydralazine and metoprolol.  2. Acute hypoxic respiratory failure due to acute cardiogenic pulmonary edema. Will do an ambulatory oxymetry on room air today, in preparation for possible dc in am.   3. Moderate, aortic stenosis. Echocardiogram with aortic valve area of 1,32 with a mean of 1,23. Will continue diuresis. Reported area parameters similar to prior echocardiogram (moderate AS). Will follow as outpatient in the cardiology clinic, continue diuresis for now.   4. Paroxysmal atrial fibrillation/ had prior history of intracranial hemmorrhage. Continue with meroprolol for rate control, aspirin for anticoagulation.  5. T2DM. Fasting glucose today at 150, patient tolerating po well, for now will continue glucose coverage and monitoring with insulin sliding scale, plus basal insulin with 20 units of glargine.   6. Hx of CVA. Continue with aspirin and statin.   7. Morbid obesity and osa. Tolerating well cpap at night, will need outpatient follow up.   8. AKI on CKD stage 3 with hypokalemia. Renal function with serum cr at 2,52 with K at 3,3 and serum bicarbonate at 31. Continue K correction with Kcl, 40 meq today.    9. HTN. Continue with amlodipine for blood pressure control.   DVT prophylaxis:heparin Code Status:full Family Communication:I spoke with patient's family at the bedside and all questions were addressed.  Disposition Plan/ discharge barriers:pending clinical  improvement, possible dc in am.  Body mass index is 45.69 kg/m. Malnutrition Type:      Malnutrition Characteristics:      Nutrition Interventions:     RN Pressure Injury Documentation:     Consultants:     Procedures:     Antimicrobials:       Subjective: Continue to improve his dyspnea and lower extremity edema, not yet back to baseline, no chest pain, no nausea or vomiting.   Objective: Vitals:   09/02/18 1203 09/02/18 1531 09/02/18 2025 09/03/18 0529  BP: 130/76 (!) 111/55 136/78 133/80  Pulse: 72  71 (!) 56  Resp: (!) 22  20 20   Temp: 97.8 F (36.6 C)  97.9 F (36.6 C) 98 F (36.7 C)  TempSrc: Oral  Oral Oral  SpO2: 95%  95% 95%  Weight:    (!) 148.6 kg  Height:        Intake/Output Summary (Last 24 hours) at 09/03/2018 0956 Last data filed at 09/03/2018 0700 Gross per 24 hour  Intake 1080 ml  Output 2100 ml  Net -1020 ml   Filed Weights   09/01/18 0522 09/02/18 0610 09/03/18 0529  Weight: (!) 147.7 kg (!) 149.1 kg (!) 148.6 kg    Examination:   General: deconditioned  Neurology: Awake and alert, non focal  E ENT: mild pallor, no icterus, oral mucosa moist Cardiovascular: No JVD. S1-S2 present, rhythmic, no gallops, rubs, or murmurs. ++/+++ bilateral lower extremity edema. Pulmonary: decreased breath sounds bilaterally at bases, adequate air movement, no wheezing, or rhonchi. Positive bibasilar rales. Gastrointestinal. Abdomen protuberant no organomegaly, non tender, no rebound or guarding Skin. No rashes Musculoskeletal: no joint deformities     Data Reviewed: I have personally reviewed following labs and imaging studies  CBC: Recent Labs  Lab 08/31/18 1406  WBC 12.3*  NEUTROABS 9.2*  HGB 12.3*  HCT 43.4  MCV 82.2  PLT 631   Basic Metabolic Panel: Recent Labs  Lab 08/31/18 1406 09/01/18 0454 09/02/18 0532 09/03/18 0422  NA 142 143 141 138  K 3.5 3.6 3.0* 3.3*  CL 100 102 96* 96*  CO2 29 28 32 31  GLUCOSE 110*  70 117* 150*  BUN 63* 66* 66* 70*  CREATININE 2.35* 2.35* 2.42* 2.52*  CALCIUM 9.4 8.8* 8.7* 8.5*   GFR: Estimated Creatinine Clearance: 40.4 mL/min (A) (by C-G formula based on SCr of 2.52 mg/dL (H)). Liver Function Tests: No results for input(s): AST, ALT, ALKPHOS, BILITOT, PROT, ALBUMIN in the last 168 hours. No results for input(s): LIPASE, AMYLASE in the last 168 hours. No results for input(s): AMMONIA in the last 168 hours. Coagulation Profile: No results for input(s): INR, PROTIME in the last 168 hours. Cardiac Enzymes: No results for input(s): CKTOTAL, CKMB, CKMBINDEX, TROPONINI in the last 168 hours. BNP (last 3 results) No results for input(s): PROBNP in the last 8760 hours. HbA1C: No results for input(s): HGBA1C in the last 72 hours. CBG: Recent Labs  Lab 09/02/18 0756 09/02/18 1159 09/02/18 1650 09/02/18 2107 09/03/18 0855  GLUCAP 134* 172* 161* 186* 155*   Lipid Profile: No results for input(s): CHOL, HDL, LDLCALC, TRIG, CHOLHDL, LDLDIRECT in the last 72 hours. Thyroid Function Tests: No results for input(s): TSH, T4TOTAL, FREET4, T3FREE, THYROIDAB in the last 72 hours. Anemia Panel: No results for input(s): VITAMINB12, FOLATE, FERRITIN, TIBC, IRON, RETICCTPCT in the last 72 hours.    Radiology Studies: I have reviewed all of the imaging during this hospital visit personally  Scheduled Meds: . amLODipine  10 mg Oral Daily  . aspirin EC  325 mg Oral Daily  . atorvastatin  40 mg Oral Daily  . cholecalciferol  1,000 Units Oral Daily  . famotidine  20 mg Oral Daily  . febuxostat  80 mg Oral Daily  . furosemide  80 mg Intravenous Q12H  . gabapentin  300 mg Oral BID  . heparin  5,000 Units Subcutaneous Q8H  . hydrALAZINE  50 mg Oral TID  . insulin aspart  0-15 Units Subcutaneous TID WC  . insulin glargine  20 Units Subcutaneous QHS  . metoprolol succinate  50 mg Oral Daily  . senna-docusate  1 tablet Oral BID   Continuous Infusions:   LOS: 3  days        Brietta Manso Gerome Apley, MD [facility] Pager [phone]

## 2018-09-03 NOTE — Progress Notes (Signed)
Patient sleeping during shift report.      

## 2018-09-04 DIAGNOSIS — I350 Nonrheumatic aortic (valve) stenosis: Secondary | ICD-10-CM

## 2018-09-04 DIAGNOSIS — N183 Chronic kidney disease, stage 3 (moderate): Secondary | ICD-10-CM

## 2018-09-04 DIAGNOSIS — I480 Paroxysmal atrial fibrillation: Secondary | ICD-10-CM

## 2018-09-04 DIAGNOSIS — I5033 Acute on chronic diastolic (congestive) heart failure: Secondary | ICD-10-CM

## 2018-09-04 LAB — GLUCOSE, CAPILLARY
Glucose-Capillary: 187 mg/dL — ABNORMAL HIGH (ref 70–99)
Glucose-Capillary: 154 mg/dL — ABNORMAL HIGH (ref 70–99)
Glucose-Capillary: 237 mg/dL — ABNORMAL HIGH (ref 70–99)
Glucose-Capillary: 202 mg/dL — ABNORMAL HIGH (ref 70–99)
Glucose-Capillary: 206 mg/dL — ABNORMAL HIGH (ref 70–99)

## 2018-09-04 LAB — BASIC METABOLIC PANEL
Anion gap: 13 (ref 5–15)
BUN: 74 mg/dL — ABNORMAL HIGH (ref 8–23)
CO2: 30 mmol/L (ref 22–32)
Calcium: 8.9 mg/dL (ref 8.9–10.3)
Chloride: 95 mmol/L — ABNORMAL LOW (ref 98–111)
Creatinine, Ser: 2.37 mg/dL — ABNORMAL HIGH (ref 0.61–1.24)
GFR calc Af Amer: 31 mL/min — ABNORMAL LOW (ref >60)
GFR calc non Af Amer: 27 mL/min — ABNORMAL LOW (ref >60)
Glucose, Bld: 152 mg/dL — ABNORMAL HIGH (ref 70–99)
Potassium: 3.8 mmol/L (ref 3.5–5.1)
Sodium: 138 mmol/L (ref 135–145)

## 2018-09-04 MED ORDER — ASPIRIN EC 325 MG PO TBEC
325.0000 mg | DELAYED_RELEASE_TABLET | Freq: Every day | ORAL | Status: DC
  Administered 2018-09-06 – 2018-09-08 (×3): 325 mg via ORAL
  Filled 2018-09-04 (×3): qty 1

## 2018-09-04 MED ORDER — SODIUM CHLORIDE 0.9% FLUSH
3.0000 mL | INTRAVENOUS | Status: DC | PRN
  Administered 2018-09-04: 3 mL via INTRAVENOUS
  Filled 2018-09-04: qty 3

## 2018-09-04 MED ORDER — SODIUM CHLORIDE 0.9 % IV SOLN: 250.0000 mL | INTRAVENOUS | Status: DC | PRN

## 2018-09-04 MED ORDER — TORSEMIDE 20 MG PO TABS
120.0000 mg | ORAL_TABLET | Freq: Two times a day (BID) | ORAL | Status: DC
  Administered 2018-09-04 (×2): 120 mg via ORAL
  Filled 2018-09-04 (×2): qty 1

## 2018-09-04 MED ORDER — SODIUM CHLORIDE 0.9 % IV SOLN
INTRAVENOUS | Status: DC
  Administered 2018-09-05: 06:00:00 via INTRAVENOUS

## 2018-09-04 MED ORDER — SODIUM CHLORIDE 0.9% FLUSH: 3.0000 mL | Freq: Two times a day (BID) | INTRAVENOUS | Status: DC

## 2018-09-04 MED ORDER — METOLAZONE 2.5 MG PO TABS
2.5000 mg | ORAL_TABLET | Freq: Every day | ORAL | Status: DC
  Administered 2018-09-04 – 2018-09-08 (×5): 2.5 mg via ORAL
  Filled 2018-09-04 (×5): qty 1

## 2018-09-04 MED ORDER — ASPIRIN 81 MG PO CHEW
81.0000 mg | CHEWABLE_TABLET | ORAL | Status: AC
  Administered 2018-09-05: 81 mg via ORAL
  Filled 2018-09-04: qty 1

## 2018-09-04 NOTE — Progress Notes (Addendum)
PROGRESS NOTE    [name]  PZW:[id number] DOB: April 21, 1946 DOA: 08/31/2018 PCP: [name], MD    Brief Narrative:  73 year old male who has significant medical history for diastolic heart failure due to hypertensive cardiomyopathy, complicated by morbid obesity, diabetes, chronic kidney disease stage 3 and obstructive sleep apnea. For the last 2 weeks he has been developing worsening and progressive dyspnea to the point where he was found hypoxic.He does have a very poor functional physical capacity and there is significant concerns about his compliance with his heart failure regimen. On his initial physical examination his temperature 98.5, blood pressure 147/84, heart rate 75, respiratory rate 24, oxygen saturation 98%on supplemental oxygen per nasal cannula, on room air sitting 93%, ambulating 87%. He had positive JVD, Rales and rhonchi bilaterally, he does have a systolic murmur at the right upper sternal border, his abdomen was protuberant, he does have++++pitting bilateral strength edema.Sodium 142, potassium 3.5, chloride 100, bicarb 29, glucose 110, BUN 63, creatinine 2.35, BNP 151, white count 12.3, hemoglobin 12.3, hematocrit 43.4, platelets 226, venous blood gas pH 7.45.Chest x-ray has cardiomegaly, increase in central markings bilaterally, pacemaker on the right side.  Patient was admitted to the hospital with a working diagnosis of acute on chronic hypoxic respiratory failure due to acute cardiogenic pulmonary edema due to decompensated acuteonchronic diastolic heart failure.   Assessment & Plan:   Active Problems:   Diabetes mellitus due to underlying condition, controlled, with stage 3 chronic kidney disease, with long-term current use of insulin (HCC)   Hypercholesterolemia   Essential hypertension   COPD (chronic obstructive pulmonary disease) (HCC)   Coronary artery disease involving native coronary artery of native heart without angina pectoris  OSA (obstructive sleep apnea)   PAF (paroxysmal atrial fibrillation) (HCC)   CKD (chronic kidney disease) stage 3, GFR 30-59 ml/min (HCC)   Heart failure ([place])   1.  Acute on chronic diastolic heart failure exacerbation.  Ejection fraction 60 to 65%.  Patient was admitted to the telemetry unit, he was aggressively diuresed with high doses of intravenous furosemide, negative fluid balance was achieved with significant improvement of his symptoms.  His today's weight is 328 pounds that apparently is around his baseline.  He will continue with aggressive diuretic regimen including furosemide and metolazone.  Unable to use ACE inhibitors or ARB due to unstable kidney function.  Continue heart failure regimen with metoprolol and hydralazine.  He does follow-up with the heart failure clinic, appointment will be made for next week January 27.   2.  Acute on chronic hypoxic respiratory failure due to acute cardiogenic pulmonary edema.  Patient was diuresed with furosemide with improvement of oxygenation. His oxymetry today is 83% on room air and 79% on ambulation, improved with 2 LPM per [place].  Clinically seems like he is at his baseline volume status but will consult cardiology since he is a well-known patient from the practice in case further adjustments are needed to his medical regiment.   3.  Severe aortic stenosis.  Further work-up with echocardiography showed preserved LV systolic function, but his aortic valve was severely calcified and thickening:  VTI ratio of LVOT to aortic valve: 0.29.  Valve area (VTI): 1.32 cm^2.  Indexed valve area (VTI): 0.47 cm^2/m^2. Peak velocity ratio of LVOT to aortic valve: 0.31.  Valve area (Vmax): 1.38 cm^2.  Indexed valve area (Vmax): 0.49 cm^2/m^2. Mean velocity ratio of LVOT to aortic valve: 0.27.  Valve area (Vmean): 1.23 cm^2.  Indexed valve area (Vmean): 0.44  cm^2/m^2. Mean gradient (S): 30 mm Hg. Peak gradient (S): 46 mm Hg.  Patient will follow-up  with the cardiology clinic next week, not a good candidate for TAVR due to his multiple comorbidities.  4.  Paroxysmal atrial fibrillation.  Rate remained controlled with metoprolol, patient not a candidate for full anticoagulation due to history of intracranial hemorrhage.  Continue aspirin.  5.  Hypertension.  Continue blood pressure control with metoprolol, hydralazine and amlodipine.  6.  Acute kidney injury chronic kidney stage III with hypokalemia.  Patient received aggressive diuresis with IV furosemide, peak creatinine reached 2.52, at discharge down to 2.37, his potassium is 3.8 and serum bicarbonate of 30.  He will resume torsemide and metolazone at discharge, along with potassium supplements, he needs a very close follow-up on his kidney function.  At home he was prescribed 160 mEq potassium chloride daily, in the hospital he has required less potassium supplements.  Will recommend to decrease dose to 40 meq twice daily with a very close follow-up of kidney function.  7.  Type 2 diabetes mellitus.  Patient placed on insulin regimen while hospitalized, he was placed on a low-dose of basal insulin along with insulin sliding scale with appropriate glucose control.  He will resume his regular regimen at discharge, he needs a close supervision on a carbohydrate restricted diet.  8.  Morbid obesity complicated by obstructive sleep apnea.  Patient tolerated well CPAP at night, his BMI 45.6, he needs a close follow-up as an outpatient, last on modifications.  9.  History of CVA.  Limited mobility, continue aspirin and statin.   DVT prophylaxis: scd   Code Status:  full Family Communication: I spoke with patient's family at the bedside and all questions were addressed.  Disposition Plan/ discharge barriers: pending cardiology evaluation.   Body mass index is 45.98 kg/m. Malnutrition Type:      Malnutrition Characteristics:      Nutrition Interventions:     RN Pressure  Injury Documentation:     Consultants:   Cardiology   Procedures:     Antimicrobials:       Subjective: Patient is feeling better very close to his baseline, continue to have oxygen desaturation on ambulation.   Objective: Vitals:   09/03/18 1155 09/03/18 2042 09/04/18 0532 09/04/18 0930  BP: (!) 141/78 115/66 (!) 143/73 131/72  Pulse: 61 60 72 73  Resp: (!) 21 20 (!) 21   Temp:  97.9 F (36.6 C) 98 F (36.7 C)   TempSrc:  Oral Oral   SpO2: (!) 84% 92% 97%   Weight:   (!) 149.6 kg   Height:        Intake/Output Summary (Last 24 hours) at 09/04/2018 0948 Last data filed at 09/04/2018 0915 Gross per 24 hour  Intake 600 ml  Output 650 ml  Net -50 ml   Filed Weights   09/02/18 0610 09/03/18 0529 09/04/18 0532  Weight: (!) 149.1 kg (!) 148.6 kg (!) 149.6 kg    Examination:   General: deconditioned  Neurology: Awake and alert, non focal  E ENT: mild pallor, no icterus, oral mucosa moist Cardiovascular: No JVD. S1-S2 present, rhythmic, no gallops, rubs, positive systolic 3/6 murmur at the right sternal border. ++ non pitting lower extremity edema. Pulmonary: decreased breath sounds bilaterally, adequate air movement, no wheezing, rhonchi or rales. Gastrointestinal. Abdomen protuberant, no organomegaly, non tender, no rebound or guarding Skin. No rashes Musculoskeletal: no joint deformities     Data Reviewed: I have personally reviewed  following labs and imaging studies  CBC: Recent Labs  Lab 08/31/18 1406  WBC 12.3*  NEUTROABS 9.2*  HGB 12.3*  HCT 43.4  MCV 82.2  PLT 793   Basic Metabolic Panel: Recent Labs  Lab 08/31/18 1406 09/01/18 0454 09/02/18 0532 09/03/18 0422 09/04/18 0543  NA 142 143 141 138 138  K 3.5 3.6 3.0* 3.3* 3.8  CL 100 102 96* 96* 95*  CO2 29 28 32 31 30  GLUCOSE 110* 70 117* 150* 152*  BUN 63* 66* 66* 70* 74*  CREATININE 2.35* 2.35* 2.42* 2.52* 2.37*  CALCIUM 9.4 8.8* 8.7* 8.5* 8.9   GFR: Estimated Creatinine  Clearance: 43.1 mL/min (A) (by C-G formula based on SCr of 2.37 mg/dL (H)). Liver Function Tests: No results for input(s): AST, ALT, ALKPHOS, BILITOT, PROT, ALBUMIN in the last 168 hours. No results for input(s): LIPASE, AMYLASE in the last 168 hours. No results for input(s): AMMONIA in the last 168 hours. Coagulation Profile: No results for input(s): INR, PROTIME in the last 168 hours. Cardiac Enzymes: No results for input(s): CKTOTAL, CKMB, CKMBINDEX, TROPONINI in the last 168 hours. BNP (last 3 results) No results for input(s): PROBNP in the last 8760 hours. HbA1C: No results for input(s): HGBA1C in the last 72 hours. CBG: Recent Labs  Lab 09/03/18 0855 09/03/18 1137 09/03/18 1715 09/03/18 2137 09/04/18 0737  GLUCAP 155* 185* 190* 187* 154*   Lipid Profile: No results for input(s): CHOL, HDL, LDLCALC, TRIG, CHOLHDL, LDLDIRECT in the last 72 hours. Thyroid Function Tests: No results for input(s): TSH, T4TOTAL, FREET4, T3FREE, THYROIDAB in the last 72 hours. Anemia Panel: No results for input(s): VITAMINB12, FOLATE, FERRITIN, TIBC, IRON, RETICCTPCT in the last 72 hours.    Radiology Studies: I have reviewed all of the imaging during this hospital visit personally     Scheduled Meds: . amLODipine  10 mg Oral Daily  . aspirin EC  325 mg Oral Daily  . atorvastatin  40 mg Oral Daily  . cholecalciferol  1,000 Units Oral Daily  . famotidine  20 mg Oral Daily  . febuxostat  80 mg Oral Daily  . gabapentin  300 mg Oral BID  . heparin  5,000 Units Subcutaneous Q8H  . hydrALAZINE  50 mg Oral TID  . insulin aspart  0-15 Units Subcutaneous TID WC  . insulin glargine  20 Units Subcutaneous QHS  . metoprolol succinate  50 mg Oral Daily  . senna-docusate  1 tablet Oral BID  . torsemide  120 mg Oral BID   Continuous Infusions:   LOS: 4 days        Maygen Sirico Gerome Apley, MD [facility] Pager [phone]

## 2018-09-04 NOTE — Progress Notes (Signed)
Physical Therapy Treatment Patient Details Name: [name] MRN: [id number] DOB: Jan 26, 1946 Today's Date: 09/04/2018    History of Present Illness Pt is a 73 y.o. male admitted with SOB. Worked up for CHF exacerbation. PMH includes legally blind, transmet amputation, CKD, obesity, DM, HTN, CAD, neuropathy.   PT Comments    Pt slowly progressing with mobility. Pt reports breathing has improved, but continues to be limited by DOE with minimal mobility this session. SpO2 down to 79% on RA, returning to 92% on 2L O2 [place] (see saturations qualifications note). Pt reports he only wears O2 at night; RN notified. Pt ambulatory with RW and min guard for balance. Discussed fall risk reduction and energy conservation strategies. Will continue to follow acutely.   Follow Up Recommendations  Home health PT;Supervision for mobility/OOB     Equipment Recommendations  None recommended by PT    Recommendations for Other Services       Precautions / Restrictions Precautions Precautions: Fall Precaution Comments: Legally blind Restrictions Weight Bearing Restrictions: No    Mobility  Bed Mobility               General bed mobility comments: Received sitting EOB. Significant time in preparation to stand, pt reports secondary to "my breathing". SpO2 83% on RA while seated EOB  Transfers Overall transfer level: Needs assistance Equipment used: Rolling walker (2 wheeled) Transfers: Sit to/from Stand Sit to Stand: Min guard         General transfer comment: Reliant on momentum to power into standing; heavy reliance on UE support. Min guard for balance  Ambulation/Gait Ambulation/Gait assistance: Min guard Gait Distance (Feet): 20 Feet Assistive device: Rolling walker (2 wheeled) Gait Pattern/deviations: Step-through pattern;Decreased stride length;Wide base of support;Trunk flexed Gait velocity: Decreased Gait velocity interpretation: 1.31 - 2.62 ft/sec, indicative of limited  community ambulator General Gait Details: Min guard for balance; constant cuing for navigation of RW due to blindness in unfamiliar environment. DOE 3/4, SpO2 down to 79% on RA   Stairs             Wheelchair Mobility    Modified Rankin (Stroke Patients Only)       Balance Overall balance assessment: Needs assistance Sitting-balance support: Feet supported;Single extremity supported Sitting balance-Leahy Scale: Good Sitting balance - Comments: Assist to don shoes. Pt with urine incontinence while seated due to unexpected urgency, demonstrates good balance maneuvering at EOB to use urinal   Standing balance support: Bilateral upper extremity supported Standing balance-Leahy Scale: Fair Standing balance comment: Can static stand without UE support                            Cognition Arousal/Alertness: Awake/alert Behavior During Therapy: WFL for tasks assessed/performed Overall Cognitive Status: No family/caregiver present to determine baseline cognitive functioning Area of Impairment: Problem solving                             Problem Solving: Requires verbal cues;Slow processing        Exercises      General Comments General comments (skin integrity, edema, etc.): Sister present at beginning of session      Pertinent Vitals/Pain Pain Assessment: No/denies pain    Home Living                      Prior Function  PT Goals (current goals can now be found in the care plan section) Acute Rehab PT Goals Patient Stated Goal: to return home when ready PT Goal Formulation: With patient Time For Goal Achievement: 09/15/18 Potential to Achieve Goals: Good Progress towards PT goals: Progressing toward goals    Frequency    Min 3X/week      PT Plan Current plan remains appropriate    Co-evaluation              AM-PAC PT "6 Clicks" Mobility   Outcome Measure  Help needed turning from your back to your  side while in a flat bed without using bedrails?: A Lot Help needed moving from lying on your back to sitting on the side of a flat bed without using bedrails?: A Little Help needed moving to and from a bed to a chair (including a wheelchair)?: A Little Help needed standing up from a chair using your arms (e.g., wheelchair or bedside chair)?: A Little Help needed to walk in hospital room?: A Little Help needed climbing 3-5 steps with a railing? : A Lot 6 Click Score: 16    End of Session Equipment Utilized During Treatment: Gait belt Activity Tolerance: Patient limited by fatigue Patient left: in chair;with call bell/phone within reach Nurse Communication: Mobility status PT Visit Diagnosis: Other abnormalities of gait and mobility (R26.89);Muscle weakness (generalized) (M62.81)     Time: 0459-9774 PT Time Calculation (min) (ACUTE ONLY): 30 min  Charges:  $Therapeutic Activity: 8-22 mins $Self Care/Home Management: 8-22                    Mabeline Caras, PT, DPT Acute Rehabilitation Services  Pager [phone] [facility] [place] 09/04/2018, 9:31 AM

## 2018-09-04 NOTE — Consult Note (Addendum)
Advanced Heart Failure Team Consult Note   Primary Physician: [name], MD HF -Cardiologist:Dr Aundra Dubin   Reason for Consultation: Heart Failure   HPI:    [name] is seen today for evaluation of hart failure at the request of Dr Cathlean Sauer.   [name] is a 73 year old with history of chronic diastolic CHF complicated by RV dysfunction, morbid obesity, CKD stage III, symptomatic sinus bradycardia s/p PPM and blindness. He has been followed closely in the HF clinic and was last seen 07/25/2018. At that time he was stable on torsemide 120 mg twice a day + metolazone on Monday and 5 mg on Thursday.   Prior to admit he has been followed by [facility] 3 days a week. He has an aide most days of the week.  He normally wears oxygen only if needed. All medicaitons are provided by [facility].  Followed by HF Paramedicine. Prior to admit he had been snacking on pickles. He has a jar of pickles in every room.   Presented to [facility] ED with increased dyspnea. O2 sats at [facility] had dropped to < 88% and he was given 80 mg IM lasix + nebs. In the ED sats on 4 liters were 98% at rest but down to 87% with exertion. CXR was concerning for vascular congestion. Admitted with acute hypoxic respiratory failure in the setting of A/C diastolic HF. Pertinent admission labs included: BNP 151, K 3.5, creatinine 2.35, troponin 0.01,and WBC 12.3.  He has been diuresing with 80 mg IV lasix twice a day + metolazone. Weight has gone down 4 pounds.   Echo 09/01/2018  Left ventricle: The cavity size was normal. Wall thickness was   increased in a pattern of moderate LVH. Systolic function was   normal. The estimated ejection fraction was in the range of 60%   to 65%. Wall motion was normal; there were no regional wall   motion abnormalities. - Aortic valve: There was severe stenosis. Valve area (VTI): 1.32   cm^2. Valve area (Vmax): 1.38 cm^2. Valve area (Vmean): 1.23   cm^2. - Left atrium: The atrium was mildly dilated. -  Right atrium: The atrium was moderately dilated.  Review of Systems: [y] = yes, [ ]  = no   General: Weight gain [ ] ; Weight loss [ ] ; Anorexia [ ] ; Fatigue [Y ]; Fever [ ] ; Chills [ ] ; Weakness [ ]   Cardiac: Chest pain/pressure [ ] ; Resting SOB [ ] ; Exertional SOB [Y ]; Orthopnea [ ] ; Pedal Edema Blue.Reese ]; Palpitations [ ] ; Syncope [ ] ; Presyncope [ ] ; Paroxysmal nocturnal dyspnea[ ]   Pulmonary: Cough [ ] ; Wheezing[ ] ; Hemoptysis[ ] ; Sputum [ ] ; Snoring [ ]   GI: Vomiting[ ] ; Dysphagia[ ] ; Melena[ ] ; Hematochezia [ ] ; Heartburn[ ] ; Abdominal pain [ ] ; Constipation [ ] ; Diarrhea [ ] ; BRBPR [ ]   GU: Hematuria[ ] ; Dysuria [ ] ; Nocturia[ ]   Vascular: Pain in legs with walking [ ] ; Pain in feet with lying flat [ ] ; Non-healing sores [ ] ; Stroke [ ] ; TIA [ ] ; Slurred speech [ ] ;  Neuro: Headaches[ ] ; Vertigo[ ] ; Seizures[ ] ; Paresthesias[ ] ;Blurred vision [ ] ; Diplopia [ ] ; Vision changes [ ]   Ortho/Skin: Arthritis [ ] ; Joint pain [Y ]; Muscle pain [ ] ; Joint swelling [ ] ; Back Pain [Y ]; Rash [ ]   Psych: Depression[ ] ; Anxiety[ ]   Heme: Bleeding problems [ ] ; Clotting disorders [ ] ; Anemia [ ]   Endocrine: Diabetes [Y ]; Thyroid dysfunction[ ]   Home Medications Prior to Admission medications   Medication Sig Start Date End Date Taking? Authorizing Provider  acetaminophen (TYLENOL) 650 MG CR tablet Take 1,300 mg by mouth 2 (two) times daily.    Yes [provider]  albuterol (PROVENTIL HFA;VENTOLIN HFA) 108 (90 Base) MCG/ACT inhaler Inhale 2 puffs into the lungs every 6 (six) hours as needed for wheezing or shortness of breath.   Yes [provider]  amLODipine (NORVASC) 10 MG tablet Take 10 mg by mouth daily. Reported on 12/09/2015   Yes [provider]  antiseptic oral rinse (BIOTENE) LIQD 15 mLs by Mouth Rinse route 3 (three) times daily.   Yes [provider]  aspirin 81 MG tablet Take 81 mg by mouth daily.    Yes [provider]  atorvastatin (LIPITOR) 40  MG tablet Take 1 tablet (40 mg total) by mouth daily. 04/16/15  Yes Croitoru, Mihai, MD  cholecalciferol (VITAMIN D) 1000 units tablet Take 1,000 Units by mouth daily.   Yes [provider]  citalopram (CELEXA) 20 MG tablet Take 20 mg by mouth daily.   Yes [provider]  famotidine (PEPCID) 20 MG tablet Take 20 mg by mouth daily.   Yes [provider]  Febuxostat (ULORIC) 80 MG TABS Take 80 mg by mouth daily.    Yes [provider]  gabapentin (NEURONTIN) 300 MG capsule Take 300 mg by mouth 2 (two) times daily.   Yes [provider]  hydrALAZINE (APRESOLINE) 50 MG tablet Take 1 tablet (50 mg total) 3 (three) times daily by mouth. 06/24/17  Yes Helberg, Larkin Ina, MD  Insulin Glargine (TOUJEO SOLOSTAR) 300 UNIT/ML SOPN Inject 60 Units into the skin 2 (two) times daily.    Yes [provider]  lidocaine (LIDODERM) 5 % Place 1 patch onto the skin daily as needed (back pain). Remove & Discard patch within 12 hours or as directed by MD   Yes [provider]  Liraglutide (VICTOZA) 18 MG/3ML SOPN Inject 1.8 mg into the skin daily.   Yes [provider]  Melatonin 3 MG CAPS Take 1 capsule by mouth at bedtime as needed (sleep).    Yes [provider]  Menthol, Topical Analgesic, (BIOFREEZE EX) Apply 1 application topically 3 (three) times daily as needed (pain). Reported on 10/27/2015   Yes [provider]  methylcellulose (ARTIFICIAL TEARS) 1 % ophthalmic solution Place 1 drop into both eyes 2 (two) times daily as needed (dry eyes).    Yes [provider]  metolazone (ZAROXOLYN) 2.5 MG tablet Take 2.5 mg by mouth daily.    Yes [provider]  metoprolol succinate (TOPROL-XL) 50 MG 24 hr tablet Take 1 tablet (50 mg total) by mouth daily. Take with or immediately following a meal. 03/21/18  Yes Larey Dresser, MD  polyethylene glycol [facility] / Floria Raveling) packet Take 17 g by mouth daily as needed for moderate  constipation.   Yes [provider]  potassium chloride SA (K-DUR,KLOR-CON) 20 MEQ tablet Take 40-60 mEq by mouth See admin instructions. 60 MEQ in the morning, 40 meq mid-day, and 60 meq in the evening   Yes [provider]  sennosides-docusate sodium (SENOKOT-S) 8.6-50 MG tablet Take 2 tablets by mouth 2 (two) times daily.    Yes [provider]  Skin Protectants, Misc. (EUCERIN) cream Apply 1 application topically 2 (two) times daily.   Yes [provider]  torsemide (DEMADEX) 20 MG tablet Take 6 tablets (120 mg total) by mouth  2 (two) times daily. 03/23/18  Yes Larey Dresser, MD    Past Medical History: Past Medical History:  Diagnosis Date  . Arthritis    "all over" (07/06/2013)  . Atrial fibrillation ([place])    not Candidate for Coumadin d/t rectal bleeding  . Benign prostatic hypertrophy   . Cardiomyopathy, hypertensive ([place])     diastolic dysfunction by 1696 echo  . Coronary artery disease     nonobstructive. 2 vessel. cardiac catheter in 2003/11/18 showing 60% LAD occlusion, 30% circumflex occlusion.  . Degenerative joint disease     Right knee.  . Diabetes type 2, uncontrolled ([place])   . Diabetic peripheral neuropathy ([place])   . Diabetic ulcer of thigh ([place])    H/O Rt thigh ulcer.  . Exertional shortness of breath    "comes and goes" (2013-07-06)  . GERD (gastroesophageal reflux disease)   . Gout   . Herpes   . Hyperlipidemia   . Hypertension   . Hypotension 09/05/2013  . Incarcerated ventral hernia    H/O. S/P repair ventral hernia repair 07/2005.  Marland Kitchen LBBB (left bundle branch block)   . Legally blind   . Morbid obesity ([place])   . Obstructive sleep apnea    On CPAP. Last sleep study (06/2009) - Severe obstructive sleep apnea/hypopnea syndrome, apnea-hypopnea index 70.5 per hour with non positional events moderately loud snoring and oxygen desaturation to a nadir of 85% on room air.  . Occult blood positive stool     History of in August  2007.  colonoscopy in January 2008 showing normal colon with fair inadequate prep. By Dr. Silvano Rusk.  . Pacemaker 11-18-03    secondary to bradycardia  . Sepsis ([place]) 08/26/2013    Past Surgical History: Past Surgical History:  Procedure Laterality Date  . AMPUTATION Left 06/22/2013   Procedure: AMPUTATION FOOT;  Surgeon: Newt Minion, MD;  Location: [place];  Service: Orthopedics;  Laterality: Left;  Left Midfoot Amputation  . CARDIAC CATHETERIZATION  10/22/2003   Dilatation of the LV with preserved LV systolic function. Tortous aorta, but no AAA. Moderate disease with 50-60% area of narrowing in the circumflex after the second OM and 70% mid narrowing in the LAD. No intervention.  Marland Kitchen CARDIOVASCULAR STRESS TEST  01/23/2010   Perfusion defect seen in inferior myocardial region is consistent with diaphragmatic attenuation. Remaining myocardium demonstrates normal myocardial perfusion with no evidence of ischemia or infarct. Stress ECG was non-diagnostic due to LBBB.  Marland Kitchen CATARACT EXTRACTION, BILATERAL Bilateral   . ESOPHAGOGASTRODUODENOSCOPY N/A 10/12/2013   Procedure: ESOPHAGOGASTRODUODENOSCOPY (EGD);  Surgeon: Gwenyth Ober, MD;  Location: [place];  Service: General;  Laterality: N/A;  bedside  . EYE SURGERY    . 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 HERNIA REPAIR    . INSERT / REPLACE / REMOVE PACEMAKER  11-18-2003   Secondary to symptomatic bradycardia. DDDR pacemaker. By Dr. Rollene Fare.  Randolm Idol / REPLACE / REMOVE PACEMAKER  11/17/2012   "old pacemaker died; had new one put in" (Jul 06, 2013)  . Pacemaker lead revision  12/2003    likely microdislodgment  of right ventricular lead  . PEG PLACEMENT N/A 10/12/2013   Procedure: PERCUTANEOUS ENDOSCOPIC GASTROSTOMY (PEG) PLACEMENT;  Surgeon: Gwenyth Ober, MD;  Location: [place];  Service: General;  Laterality: N/A;  . PERMANENT PACEMAKER GENERATOR CHANGE N/A 11/03/2012   Procedure: PERMANENT PACEMAKER  GENERATOR CHANGE;  Surgeon: Sanda Klein, MD;  Location: [place] C[facility];  Service:  Cardiovascular;  Laterality: N/A;  . REFRACTIVE SURGERY Bilateral   . TRANSTHORACIC ECHOCARDIOGRAM  01/01/2013   EF 50-55%, ventricular septum-thickness moderately increased  . VENOUS DOPPLER  01/17/2013   No evidence of thrombus or thrombophlebitis. Left GSVs appear enlarged and demonstrate valvular insuffiiciency. Bilateral SSVs appeared patent with no valvular insufficiency seen.  Marland Kitchen VENTRAL HERNIA REPAIR  07/2005    S/P laparotomy, lysis of adhesions, repair of incarcerated ventral hernia with  partial omentectomy.. Performed by Dr. Marlou Starks.    Family History: Family History  Problem Relation Age of Onset  . Coronary artery disease Sister   . Heart disease Sister   . Allergies Sister   . Asthma Brother   . Rheum arthritis Brother     Social History: Social History   Socioeconomic History  . Marital status: Divorced    Spouse name: Not on file  . Number of children: 4  . Years of education: 57  . Highest education level: 12th grade  Occupational History  . Occupation: disabled  Social Needs  . Financial resource strain: Somewhat hard  . Food insecurity:    Worry: Sometimes true    Inability: Sometimes true  . Transportation needs:    Medical: No    Non-medical: Yes  Tobacco Use  . Smoking status: Former Smoker    Packs/day: 0.00    Years: 0.00    Pack years: 0.00    Types: Cigarettes    Last attempt to quit: 08/17/2003    Years since quitting: 15.0  . Smokeless tobacco: Never Used  . Tobacco comment: started at age 77.  only smoked on occ- very rarely.0  Substance and Sexual Activity  . Alcohol use: Yes    Comment: 06/23/2013 "drank some; never had a problem w/it"  . Drug use: No  . Sexual activity: Never  Lifestyle  . Physical activity:    Days per week: Not on file    Minutes per session: Not on file  . Stress: Not on file  Relationships  . Social connections:    Talks on phone:  Not on file    Gets together: Not on file    Attends religious service: Not on file    Active member of club or organization: Not on file    Attends meetings of clubs or organizations: Not on file    Relationship status: Not on file  Other Topics Concern  . Not on file  Social History Narrative   12/10- reports issues with housing- states it is not disability friendly but has ramp, first floor apt, and has grab bars/assistive devices provided by [facility].  Reports that he has concerns with having enough food but gets meals on wheels, eats at [facility] 3x a week, and has personal aid who cooks and shops for him.  Reports concerns with transportation but gets [facility] transport for all of his medical appts and his family or an aid helps him with other transportation needs.    Allergies:  No Known Allergies  Objective:    Vital Signs:   Temp:  [97.9 F (36.6 C)-98 F (36.7 C)] 98 F (36.7 C) (01/20 0532) Pulse Rate:  [60-73] 73 (01/20 0930) Resp:  [20-21] 21 (01/20 0532) BP: (115-143)/(66-78) 131/72 (01/20 0930) SpO2:  [84 %-97 %] 97 % (01/20 0532) Weight:  [149.6 kg] 149.6 kg (01/20 0532) Last BM Date: 09/03/18  Weight change: Filed Weights   09/02/18 0610 09/03/18 0529 09/04/18 0532  Weight: (!) 149.1 kg (!) 148.6 kg (!) 149.6  kg    Intake/Output:   Intake/Output Summary (Last 24 hours) at 09/04/2018 1036 Last data filed at 09/04/2018 0915 Gross per 24 hour  Intake 600 ml  Output 650 ml  Net -50 ml      Physical Exam    General:  No resp difficulty. Sitting in the chair.  HEENT: normal Neck: supple. JVP ~10  . Carotids 2+ bilat; no bruits. No lymphadenopathy or thyromegaly appreciated. Cor: PMI nondisplaced. Regular rate & rhythm. No rubs, gallops or murmurs. Lungs: clear on 2 liters oxygen  Abdomen: obese, soft, nontender, nondistended. No hepatosplenomegaly. No bruits or masses. Good bowel sounds. Extremities: no cyanosis, clubbing, rash, R and LLE chronic hyperpigmentation 1+  edema Neuro: alert & orientedx3, cranial nerves grossly intact. moves all 4 extremities w/o difficulty. Affect pleasant   Telemetry   NSR   EKG   SR 75 bpm  Labs   Basic Metabolic Panel: Recent Labs  Lab 08/31/18 1406 09/01/18 0454 09/02/18 0532 09/03/18 0422 09/04/18 0543  NA 142 143 141 138 138  K 3.5 3.6 3.0* 3.3* 3.8  CL 100 102 96* 96* 95*  CO2 29 28 32 31 30  GLUCOSE 110* 70 117* 150* 152*  BUN 63* 66* 66* 70* 74*  CREATININE 2.35* 2.35* 2.42* 2.52* 2.37*  CALCIUM 9.4 8.8* 8.7* 8.5* 8.9    Liver Function Tests: No results for input(s): AST, ALT, ALKPHOS, BILITOT, PROT, ALBUMIN in the last 168 hours. No results for input(s): LIPASE, AMYLASE in the last 168 hours. No results for input(s): AMMONIA in the last 168 hours.  CBC: Recent Labs  Lab 08/31/18 1406  WBC 12.3*  NEUTROABS 9.2*  HGB 12.3*  HCT 43.4  MCV 82.2  PLT 226    Cardiac Enzymes: No results for input(s): CKTOTAL, CKMB, CKMBINDEX, TROPONINI in the last 168 hours.  BNP: BNP (last 3 results) Recent Labs    08/31/18 1406  BNP 151.0*    ProBNP (last 3 results) No results for input(s): PROBNP in the last 8760 hours.   CBG: Recent Labs  Lab 09/03/18 0855 09/03/18 1137 09/03/18 1715 09/03/18 2137 09/04/18 0737  GLUCAP 155* 185* 190* 187* 154*    Coagulation Studies: No results for input(s): LABPROT, INR in the last 72 hours.   Imaging    No results found.   Medications:     Current Medications: . amLODipine  10 mg Oral Daily  . aspirin EC  325 mg Oral Daily  . atorvastatin  40 mg Oral Daily  . cholecalciferol  1,000 Units Oral Daily  . famotidine  20 mg Oral Daily  . febuxostat  80 mg Oral Daily  . gabapentin  300 mg Oral BID  . heparin  5,000 Units Subcutaneous Q8H  . hydrALAZINE  50 mg Oral TID  . insulin aspart  0-15 Units Subcutaneous TID WC  . insulin glargine  20 Units Subcutaneous QHS  . metolazone  2.5 mg Oral Daily  . metoprolol succinate  50 mg Oral  Daily  . senna-docusate  1 tablet Oral BID  . torsemide  120 mg Oral BID     Infusions:     Patient Profile   [name] is a 73 year old with history of chronic diastolic CHF complicated by RV dysfunction, morbid obesity, CKD stage III, symptomatic sinus bradycardia s/p PPM and blindness.  Admitted acute hypoxic respiratory failure and A/C diastolic HF.  Assessment/Plan   1. Acute Hypoxic Respiratory Failure Oxygen saturation low on admit. Placed on 4 liters  oxygen. Now down to 2 liters oxygen. Sats stable.   2. A/C Diastolic HF ECHO EF preserved this admit  Volume status difficult to assess due to body habitus. Plan for RHC tomorrow.  Continue torsemide 120 mg twice a day. 3. CKD Stage IV  Creatinine baseline 2.2-2.4.  Check BMET in am.  4. Aortic Stenosis ECHO reviewed by Dr Aundra Dubin. Appears to be worse on ECHO. Will set up TEE tomorrow to evaluate. 5. PAF Not anticoagulated with history of intracerebral hemorrhage. No amio with h/o possible amio toxicity Continue metoprolol 50 mg daily.  6. DMII Per PCP  7 . Morbid Obesity Body mass index is 45.98 kg/m. 8. H/O CVA  9. CAD Nonobstructive CAD 2005 with 60% LAD stenosis. No CAD.   Dr Aundra Dubin called and spoke to his daughter.   Medication concerns reviewed with patient and pharmacy team. Barriers identified: n/a  Length of Stay: 4  [name], NP  09/04/2018, 10:36 AM  Advanced Heart Failure Team Pager [phone] (M-F; 7a - 4p)  Please contact [facility] for night-coverage after hours (4p -7a ) and weekends on [email or web address]  Patient seen with NP, agree with the above note.    He was admitted with acute/chronic diastolic CHF.  Echo this admission with EF 60-65%, mildly D-shaped interventricular septum with mildly decreased RV systolic function, at least moderate and possibly severe aortic stenosis.   He has diuresed some this admission, creatinine lower today at 2.37, baseline roughly 2-2.3.    Exam is very  difficult for volume with thick neck and chronic venous stasis changes in legs. 3/6 systolic crescendo-decrescendo murmur RUSB with some obscuring of S2.  Bilateral rhonchi.   1. Chronic diastolic CHF with prominent RV failure: Echo this admission with EF 60-65%, AS appears at least moderate and possibly severe, mildly decreased RV systolic function with D-shaped interventricular septum. He has diuresed some in the hospital.  Volume status is very difficult to assess on exam. He has been transitioned back to his home diuretic dose.   - Continue torsemide 120 mg BID and continue daily metolazone 2.5 mg daily for now (prior home regimen).  - I think he needs RHC to assess how well we have diuresed him given difficulty of exam.  We discussed risks/benefits of procedure and he agrees.  Will aim for left brachial access.  2. Aortic stenosis:  I reviewed echo this admission, somewhat difficult study.  There is at least moderate, possibly severe AS.  It is possible that severe AS is making volume management difficult.  - I will arrange for TEE tomorrow to better evaluate the aortic valve.  We discussed risks/benefits and he agrees to procedure.  - He will be difficult candidate for TAVR given comorbidities but hopefully would be feasible if needed.  3. Atrial fibrillation:  Paroxysmal.  He has history of CVA. He has been in NSR this admission.  He is not anticoagulated (takes ASA) due to prior intracerebral hemorrhage. 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    - Continue Toprol XL 50 mg daily.  4. CAD: Nonobstructive.  No chest pain.  - Continue ASA and statin.   5. 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 CKD: Stage III. Creatinine 2.37 today, this is near his baseline.   7. OSA: Continue nightly CPAP.  8. Macular degeneration: He is legally blind.   Loralie Champagne 09/04/2018 11:52 AM

## 2018-09-04 NOTE — H&P (View-Only) (Signed)
Advanced Heart Failure Team Consult Note   Primary Physician: [name], MD HF -Cardiologist:Dr Aundra Dubin   Reason for Consultation: Heart Failure   HPI:    [name] is seen today for evaluation of hart failure at the request of Dr Cathlean Sauer.   [name] is a 73 year old with history of chronic diastolic CHF complicated by RV dysfunction, morbid obesity, CKD stage III, symptomatic sinus bradycardia s/p PPM and blindness. He has been followed closely in the HF clinic and was last seen 07/25/2018. At that time he was stable on torsemide 120 mg twice a day + metolazone on Monday and 5 mg on Thursday.   Prior to admit he has been followed by [facility] 3 days a week. He has an aide most days of the week.  He normally wears oxygen only if needed. All medicaitons are provided by [facility].  Followed by HF Paramedicine. Prior to admit he had been snacking on pickles. He has a jar of pickles in every room.   Presented to [facility] ED with increased dyspnea. O2 sats at [facility] had dropped to < 88% and he was given 80 mg IM lasix + nebs. In the ED sats on 4 liters were 98% at rest but down to 87% with exertion. CXR was concerning for vascular congestion. Admitted with acute hypoxic respiratory failure in the setting of A/C diastolic HF. Pertinent admission labs included: BNP 151, K 3.5, creatinine 2.35, troponin 0.01,and WBC 12.3.  He has been diuresing with 80 mg IV lasix twice a day + metolazone. Weight has gone down 4 pounds.   Echo 09/01/2018  Left ventricle: The cavity size was normal. Wall thickness was   increased in a pattern of moderate LVH. Systolic function was   normal. The estimated ejection fraction was in the range of 60%   to 65%. Wall motion was normal; there were no regional wall   motion abnormalities. - Aortic valve: There was severe stenosis. Valve area (VTI): 1.32   cm^2. Valve area (Vmax): 1.38 cm^2. Valve area (Vmean): 1.23   cm^2. - Left atrium: The atrium was mildly dilated. -  Right atrium: The atrium was moderately dilated.  Review of Systems: [y] = yes, [ ]  = no   General: Weight gain [ ] ; Weight loss [ ] ; Anorexia [ ] ; Fatigue [Y ]; Fever [ ] ; Chills [ ] ; Weakness [ ]   Cardiac: Chest pain/pressure [ ] ; Resting SOB [ ] ; Exertional SOB [Y ]; Orthopnea [ ] ; Pedal Edema Blue.Reese ]; Palpitations [ ] ; Syncope [ ] ; Presyncope [ ] ; Paroxysmal nocturnal dyspnea[ ]   Pulmonary: Cough [ ] ; Wheezing[ ] ; Hemoptysis[ ] ; Sputum [ ] ; Snoring [ ]   GI: Vomiting[ ] ; Dysphagia[ ] ; Melena[ ] ; Hematochezia [ ] ; Heartburn[ ] ; Abdominal pain [ ] ; Constipation [ ] ; Diarrhea [ ] ; BRBPR [ ]   GU: Hematuria[ ] ; Dysuria [ ] ; Nocturia[ ]   Vascular: Pain in legs with walking [ ] ; Pain in feet with lying flat [ ] ; Non-healing sores [ ] ; Stroke [ ] ; TIA [ ] ; Slurred speech [ ] ;  Neuro: Headaches[ ] ; Vertigo[ ] ; Seizures[ ] ; Paresthesias[ ] ;Blurred vision [ ] ; Diplopia [ ] ; Vision changes [ ]   Ortho/Skin: Arthritis [ ] ; Joint pain [Y ]; Muscle pain [ ] ; Joint swelling [ ] ; Back Pain [Y ]; Rash [ ]   Psych: Depression[ ] ; Anxiety[ ]   Heme: Bleeding problems [ ] ; Clotting disorders [ ] ; Anemia [ ]   Endocrine: Diabetes [Y ]; Thyroid dysfunction[ ]   Home Medications Prior to Admission medications   Medication Sig Start Date End Date Taking? Authorizing Provider  acetaminophen (TYLENOL) 650 MG CR tablet Take 1,300 mg by mouth 2 (two) times daily.    Yes [provider]  albuterol (PROVENTIL HFA;VENTOLIN HFA) 108 (90 Base) MCG/ACT inhaler Inhale 2 puffs into the lungs every 6 (six) hours as needed for wheezing or shortness of breath.   Yes [provider]  amLODipine (NORVASC) 10 MG tablet Take 10 mg by mouth daily. Reported on 12/09/2015   Yes [provider]  antiseptic oral rinse (BIOTENE) LIQD 15 mLs by Mouth Rinse route 3 (three) times daily.   Yes [provider]  aspirin 81 MG tablet Take 81 mg by mouth daily.    Yes [provider]  atorvastatin (LIPITOR) 40  MG tablet Take 1 tablet (40 mg total) by mouth daily. 04/16/15  Yes Croitoru, Mihai, MD  cholecalciferol (VITAMIN D) 1000 units tablet Take 1,000 Units by mouth daily.   Yes [provider]  citalopram (CELEXA) 20 MG tablet Take 20 mg by mouth daily.   Yes [provider]  famotidine (PEPCID) 20 MG tablet Take 20 mg by mouth daily.   Yes [provider]  Febuxostat (ULORIC) 80 MG TABS Take 80 mg by mouth daily.    Yes [provider]  gabapentin (NEURONTIN) 300 MG capsule Take 300 mg by mouth 2 (two) times daily.   Yes [provider]  hydrALAZINE (APRESOLINE) 50 MG tablet Take 1 tablet (50 mg total) 3 (three) times daily by mouth. 06/24/17  Yes Helberg, Larkin Ina, MD  Insulin Glargine (TOUJEO SOLOSTAR) 300 UNIT/ML SOPN Inject 60 Units into the skin 2 (two) times daily.    Yes [provider]  lidocaine (LIDODERM) 5 % Place 1 patch onto the skin daily as needed (back pain). Remove & Discard patch within 12 hours or as directed by MD   Yes [provider]  Liraglutide (VICTOZA) 18 MG/3ML SOPN Inject 1.8 mg into the skin daily.   Yes [provider]  Melatonin 3 MG CAPS Take 1 capsule by mouth at bedtime as needed (sleep).    Yes [provider]  Menthol, Topical Analgesic, (BIOFREEZE EX) Apply 1 application topically 3 (three) times daily as needed (pain). Reported on 10/27/2015   Yes [provider]  methylcellulose (ARTIFICIAL TEARS) 1 % ophthalmic solution Place 1 drop into both eyes 2 (two) times daily as needed (dry eyes).    Yes [provider]  metolazone (ZAROXOLYN) 2.5 MG tablet Take 2.5 mg by mouth daily.    Yes [provider]  metoprolol succinate (TOPROL-XL) 50 MG 24 hr tablet Take 1 tablet (50 mg total) by mouth daily. Take with or immediately following a meal. 03/21/18  Yes Larey Dresser, MD  polyethylene glycol [facility] Floria Raveling) packet Take 17 g by mouth daily as needed for moderate  constipation.   Yes [provider]  potassium chloride SA (K-DUR,KLOR-CON) 20 MEQ tablet Take 40-60 mEq by mouth See admin instructions. 60 MEQ in the morning, 40 meq mid-day, and 60 meq in the evening   Yes [provider]  sennosides-docusate sodium (SENOKOT-S) 8.6-50 MG tablet Take 2 tablets by mouth 2 (two) times daily.    Yes [provider]  Skin Protectants, Misc. (EUCERIN) cream Apply 1 application topically 2 (two) times daily.   Yes [provider]  torsemide (DEMADEX) 20 MG tablet Take 6 tablets (120 mg total) by mouth  2 (two) times daily. 03/23/18  Yes Larey Dresser, MD    Past Medical History: Past Medical History:  Diagnosis Date  . Arthritis    "all over" (July 12, 2013)  . Atrial fibrillation ([place])    not Candidate for Coumadin d/t rectal bleeding  . Benign prostatic hypertrophy   . Cardiomyopathy, hypertensive ([place])     diastolic dysfunction by 9562 echo  . Coronary artery disease     nonobstructive. 2 vessel. cardiac catheter in 2003/11/24 showing 60% LAD occlusion, 30% circumflex occlusion.  . Degenerative joint disease     Right knee.  . Diabetes type 2, uncontrolled ([place])   . Diabetic peripheral neuropathy ([place])   . Diabetic ulcer of thigh ([place])    H/O Rt thigh ulcer.  . Exertional shortness of breath    "comes and goes" (July 12, 2013)  . GERD (gastroesophageal reflux disease)   . Gout   . Herpes   . Hyperlipidemia   . Hypertension   . Hypotension 09/05/2013  . Incarcerated ventral hernia    H/O. S/P repair ventral hernia repair 07/2005.  Marland Kitchen LBBB (left bundle branch block)   . Legally blind   . Morbid obesity ([place])   . Obstructive sleep apnea    On CPAP. Last sleep study (06/2009) - Severe obstructive sleep apnea/hypopnea syndrome, apnea-hypopnea index 70.5 per hour with non positional events moderately loud snoring and oxygen desaturation to a nadir of 85% on room air.  . Occult blood positive stool     History of in August  2007.  colonoscopy in January 2008 showing normal colon with fair inadequate prep. By Dr. Silvano Rusk.  . Pacemaker 11-24-03    secondary to bradycardia  . Sepsis ([place]) 08/26/2013    Past Surgical History: Past Surgical History:  Procedure Laterality Date  . AMPUTATION Left 06/22/2013   Procedure: AMPUTATION FOOT;  Surgeon: Newt Minion, MD;  Location: [place];  Service: Orthopedics;  Laterality: Left;  Left Midfoot Amputation  . CARDIAC CATHETERIZATION  10/22/2003   Dilatation of the LV with preserved LV systolic function. Tortous aorta, but no AAA. Moderate disease with 50-60% area of narrowing in the circumflex after the second OM and 70% mid narrowing in the LAD. No intervention.  Marland Kitchen CARDIOVASCULAR STRESS TEST  01/23/2010   Perfusion defect seen in inferior myocardial region is consistent with diaphragmatic attenuation. Remaining myocardium demonstrates normal myocardial perfusion with no evidence of ischemia or infarct. Stress ECG was non-diagnostic due to LBBB.  Marland Kitchen CATARACT EXTRACTION, BILATERAL Bilateral   . ESOPHAGOGASTRODUODENOSCOPY N/A 10/12/2013   Procedure: ESOPHAGOGASTRODUODENOSCOPY (EGD);  Surgeon: Gwenyth Ober, MD;  Location: [place];  Service: General;  Laterality: N/A;  bedside  . EYE SURGERY    . 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 HERNIA REPAIR    . INSERT / REPLACE / REMOVE PACEMAKER  11-24-2003   Secondary to symptomatic bradycardia. DDDR pacemaker. By Dr. Rollene Fare.  Randolm Idol / REPLACE / REMOVE PACEMAKER  Nov 23, 2012   "old pacemaker died; had new one put in" (2013/07/12)  . Pacemaker lead revision  12/2003    likely microdislodgment  of right ventricular lead  . PEG PLACEMENT N/A 10/12/2013   Procedure: PERCUTANEOUS ENDOSCOPIC GASTROSTOMY (PEG) PLACEMENT;  Surgeon: Gwenyth Ober, MD;  Location: [place];  Service: General;  Laterality: N/A;  . PERMANENT PACEMAKER GENERATOR CHANGE N/A 11/03/2012   Procedure: PERMANENT PACEMAKER  GENERATOR CHANGE;  Surgeon: Sanda Klein, MD;  Location: [place] CATH LAB;  Service:  Cardiovascular;  Laterality: N/A;  . REFRACTIVE SURGERY Bilateral   . TRANSTHORACIC ECHOCARDIOGRAM  01/01/2013   EF 50-55%, ventricular septum-thickness moderately increased  . VENOUS DOPPLER  01/17/2013   No evidence of thrombus or thrombophlebitis. Left GSVs appear enlarged and demonstrate valvular insuffiiciency. Bilateral SSVs appeared patent with no valvular insufficiency seen.  Marland Kitchen VENTRAL HERNIA REPAIR  07/2005    S/P laparotomy, lysis of adhesions, repair of incarcerated ventral hernia with  partial omentectomy.. Performed by Dr. Marlou Starks.    Family History: Family History  Problem Relation Age of Onset  . Coronary artery disease Sister   . Heart disease Sister   . Allergies Sister   . Asthma Brother   . Rheum arthritis Brother     Social History: Social History   Socioeconomic History  . Marital status: Divorced    Spouse name: Not on file  . Number of children: 4  . Years of education: 79  . Highest education level: 12th grade  Occupational History  . Occupation: disabled  Social Needs  . Financial resource strain: Somewhat hard  . Food insecurity:    Worry: Sometimes true    Inability: Sometimes true  . Transportation needs:    Medical: No    Non-medical: Yes  Tobacco Use  . Smoking status: Former Smoker    Packs/day: 0.00    Years: 0.00    Pack years: 0.00    Types: Cigarettes    Last attempt to quit: 08/17/2003    Years since quitting: 15.0  . Smokeless tobacco: Never Used  . Tobacco comment: started at age 75.  only smoked on occ- very rarely.0  Substance and Sexual Activity  . Alcohol use: Yes    Comment: 06/23/2013 "drank some; never had a problem w/it"  . Drug use: No  . Sexual activity: Never  Lifestyle  . Physical activity:    Days per week: Not on file    Minutes per session: Not on file  . Stress: Not on file  Relationships  . Social connections:    Talks on phone:  Not on file    Gets together: Not on file    Attends religious service: Not on file    Active member of club or organization: Not on file    Attends meetings of clubs or organizations: Not on file    Relationship status: Not on file  Other Topics Concern  . Not on file  Social History Narrative   12/10- reports issues with housing- states it is not disability friendly but has ramp, first floor apt, and has grab bars/assistive devices provided by [facility].  Reports that he has concerns with having enough food but gets meals on wheels, eats at [facility] 3x a week, and has personal aid who cooks and shops for him.  Reports concerns with transportation but gets [facility] transport for all of his medical appts and his family or an aid helps him with other transportation needs.    Allergies:  No Known Allergies  Objective:    Vital Signs:   Temp:  [97.9 F (36.6 C)-98 F (36.7 C)] 98 F (36.7 C) (01/20 0532) Pulse Rate:  [60-73] 73 (01/20 0930) Resp:  [20-21] 21 (01/20 0532) BP: (115-143)/(66-78) 131/72 (01/20 0930) SpO2:  [84 %-97 %] 97 % (01/20 0532) Weight:  [149.6 kg] 149.6 kg (01/20 0532) Last BM Date: 09/03/18  Weight change: Filed Weights   09/02/18 0610 09/03/18 0529 09/04/18 0532  Weight: (!) 149.1 kg (!) 148.6 kg (!) 149.6  kg    Intake/Output:   Intake/Output Summary (Last 24 hours) at 09/04/2018 1036 Last data filed at 09/04/2018 0915 Gross per 24 hour  Intake 600 ml  Output 650 ml  Net -50 ml      Physical Exam    General:  No resp difficulty. Sitting in the chair.  HEENT: normal Neck: supple. JVP ~10  . Carotids 2+ bilat; no bruits. No lymphadenopathy or thyromegaly appreciated. Cor: PMI nondisplaced. Regular rate & rhythm. No rubs, gallops or murmurs. Lungs: clear on 2 liters oxygen  Abdomen: obese, soft, nontender, nondistended. No hepatosplenomegaly. No bruits or masses. Good bowel sounds. Extremities: no cyanosis, clubbing, rash, R and LLE chronic hyperpigmentation 1+  edema Neuro: alert & orientedx3, cranial nerves grossly intact. moves all 4 extremities w/o difficulty. Affect pleasant   Telemetry   NSR   EKG   SR 75 bpm  Labs   Basic Metabolic Panel: Recent Labs  Lab 08/31/18 1406 09/01/18 0454 09/02/18 0532 09/03/18 0422 09/04/18 0543  NA 142 143 141 138 138  K 3.5 3.6 3.0* 3.3* 3.8  CL 100 102 96* 96* 95*  CO2 29 28 32 31 30  GLUCOSE 110* 70 117* 150* 152*  BUN 63* 66* 66* 70* 74*  CREATININE 2.35* 2.35* 2.42* 2.52* 2.37*  CALCIUM 9.4 8.8* 8.7* 8.5* 8.9    Liver Function Tests: No results for input(s): AST, ALT, ALKPHOS, BILITOT, PROT, ALBUMIN in the last 168 hours. No results for input(s): LIPASE, AMYLASE in the last 168 hours. No results for input(s): AMMONIA in the last 168 hours.  CBC: Recent Labs  Lab 08/31/18 1406  WBC 12.3*  NEUTROABS 9.2*  HGB 12.3*  HCT 43.4  MCV 82.2  PLT 226    Cardiac Enzymes: No results for input(s): CKTOTAL, CKMB, CKMBINDEX, TROPONINI in the last 168 hours.  BNP: BNP (last 3 results) Recent Labs    08/31/18 1406  BNP 151.0*    ProBNP (last 3 results) No results for input(s): PROBNP in the last 8760 hours.   CBG: Recent Labs  Lab 09/03/18 0855 09/03/18 1137 09/03/18 1715 09/03/18 2137 09/04/18 0737  GLUCAP 155* 185* 190* 187* 154*    Coagulation Studies: No results for input(s): LABPROT, INR in the last 72 hours.   Imaging    No results found.   Medications:     Current Medications: . amLODipine  10 mg Oral Daily  . aspirin EC  325 mg Oral Daily  . atorvastatin  40 mg Oral Daily  . cholecalciferol  1,000 Units Oral Daily  . famotidine  20 mg Oral Daily  . febuxostat  80 mg Oral Daily  . gabapentin  300 mg Oral BID  . heparin  5,000 Units Subcutaneous Q8H  . hydrALAZINE  50 mg Oral TID  . insulin aspart  0-15 Units Subcutaneous TID WC  . insulin glargine  20 Units Subcutaneous QHS  . metolazone  2.5 mg Oral Daily  . metoprolol succinate  50 mg Oral  Daily  . senna-docusate  1 tablet Oral BID  . torsemide  120 mg Oral BID     Infusions:     Patient Profile   [name] is a 73 year old with history of chronic diastolic CHF complicated by RV dysfunction, morbid obesity, CKD stage III, symptomatic sinus bradycardia s/p PPM and blindness.  Admitted acute hypoxic respiratory failure and A/C diastolic HF.  Assessment/Plan   1. Acute Hypoxic Respiratory Failure Oxygen saturation low on admit. Placed on 4 liters  oxygen. Now down to 2 liters oxygen. Sats stable.   2. A/C Diastolic HF ECHO EF preserved this admit  Volume status difficult to assess due to body habitus. Plan for RHC tomorrow.  Continue torsemide 120 mg twice a day. 3. CKD Stage IV  Creatinine baseline 2.2-2.4.  Check BMET in am.  4. Aortic Stenosis ECHO reviewed by Dr Aundra Dubin. Appears to be worse on ECHO. Will set up TEE tomorrow to evaluate. 5. PAF Not anticoagulated with history of intracerebral hemorrhage. No amio with h/o possible amio toxicity Continue metoprolol 50 mg daily.  6. DMII Per PCP  7 . Morbid Obesity Body mass index is 45.98 kg/m. 8. H/O CVA  9. CAD Nonobstructive CAD 2005 with 60% LAD stenosis. No CAD.   Dr Aundra Dubin called and spoke to his daughter.   Medication concerns reviewed with patient and pharmacy team. Barriers identified: n/a  Length of Stay: 4  [name], NP  09/04/2018, 10:36 AM  Advanced Heart Failure Team Pager [phone] (M-F; 7a - 4p)  Please contact [facility] for night-coverage after hours (4p -7a ) and weekends on [email or web address]  Patient seen with NP, agree with the above note.    He was admitted with acute/chronic diastolic CHF.  Echo this admission with EF 60-65%, mildly D-shaped interventricular septum with mildly decreased RV systolic function, at least moderate and possibly severe aortic stenosis.   He has diuresed some this admission, creatinine lower today at 2.37, baseline roughly 2-2.3.    Exam is very  difficult for volume with thick neck and chronic venous stasis changes in legs. 3/6 systolic crescendo-decrescendo murmur RUSB with some obscuring of S2.  Bilateral rhonchi.   1. Chronic diastolic CHF with prominent RV failure: Echo this admission with EF 60-65%, AS appears at least moderate and possibly severe, mildly decreased RV systolic function with D-shaped interventricular septum. He has diuresed some in the hospital.  Volume status is very difficult to assess on exam. He has been transitioned back to his home diuretic dose.   - Continue torsemide 120 mg BID and continue daily metolazone 2.5 mg daily for now (prior home regimen).  - I think he needs RHC to assess how well we have diuresed him given difficulty of exam.  We discussed risks/benefits of procedure and he agrees.  Will aim for left brachial access.  2. Aortic stenosis:  I reviewed echo this admission, somewhat difficult study.  There is at least moderate, possibly severe AS.  It is possible that severe AS is making volume management difficult.  - I will arrange for TEE tomorrow to better evaluate the aortic valve.  We discussed risks/benefits and he agrees to procedure.  - He will be difficult candidate for TAVR given comorbidities but hopefully would be feasible if needed.  3. Atrial fibrillation:  Paroxysmal.  He has history of CVA. He has been in NSR this admission.  He is not anticoagulated (takes ASA) due to prior intracerebral hemorrhage. 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    - Continue Toprol XL 50 mg daily.  4. CAD: Nonobstructive.  No chest pain.  - Continue ASA and statin.   5. 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 CKD: Stage III. Creatinine 2.37 today, this is near his baseline.   7. OSA: Continue nightly CPAP.  8. Macular degeneration: He is legally blind.   Loralie Champagne 09/04/2018 11:52 AM

## 2018-09-04 NOTE — Progress Notes (Signed)
SATURATION QUALIFICATIONS: (This note is used to comply with regulatory documentation for home oxygen)  Patient Saturations on Room Air at Rest = 83%  Patient Saturations on Room Air while Ambulating = 79%  Patient Saturations on 2 Liters of oxygen while Ambulating = 92%  Please briefly explain why patient needs home oxygen: Pt requires supplemental oxygen to maintain SpO2 >88% with mobility.  Mabeline Caras, PT, DPT Acute Rehabilitation Services  Pager [phone] Office [phone]

## 2018-09-05 ENCOUNTER — Encounter (HOSPITAL_COMMUNITY)
Admission: EM | Disposition: A | Discharge status: Skilled Nursing Facility | Payer: Self-pay | Source: Home / Self Care | Attending: Internal Medicine

## 2018-09-05 ENCOUNTER — Encounter (HOSPITAL_COMMUNITY): Payer: Self-pay | Admitting: Cardiology

## 2018-09-05 ENCOUNTER — Ambulatory Visit (HOSPITAL_COMMUNITY): Payer: Medicare (Managed Care)

## 2018-09-05 ENCOUNTER — Inpatient Hospital Stay (HOSPITAL_COMMUNITY): Payer: Medicare (Managed Care) | Admitting: Registered Nurse

## 2018-09-05 DIAGNOSIS — I350 Nonrheumatic aortic (valve) stenosis: Secondary | ICD-10-CM

## 2018-09-05 DIAGNOSIS — I509 Heart failure, unspecified: Secondary | ICD-10-CM

## 2018-09-05 HISTORY — PX: RIGHT HEART CATH: CATH118263

## 2018-09-05 HISTORY — PX: TEE WITHOUT CARDIOVERSION: SHX5443

## 2018-09-05 LAB — BASIC METABOLIC PANEL
Anion gap: 11 (ref 5–15)
BUN: 78 mg/dL — ABNORMAL HIGH (ref 8–23)
CO2: 32 mmol/L (ref 22–32)
CREATININE: 2.5 mg/dL — AB (ref 0.61–1.24)
Calcium: 8.9 mg/dL (ref 8.9–10.3)
Chloride: 96 mmol/L — ABNORMAL LOW (ref 98–111)
GFR calc Af Amer: 29 mL/min — ABNORMAL LOW (ref >60)
GFR calc non Af Amer: 25 mL/min — ABNORMAL LOW (ref >60)
Glucose, Bld: 157 mg/dL — ABNORMAL HIGH (ref 70–99)
Potassium: 3.2 mmol/L — ABNORMAL LOW (ref 3.5–5.1)
Sodium: 139 mmol/L (ref 135–145)

## 2018-09-05 LAB — CBC
HCT: 37 % — ABNORMAL LOW (ref 39.0–52.0)
Hemoglobin: 10.6 g/dL — ABNORMAL LOW (ref 13.0–17.0)
MCH: 23.3 pg — AB (ref 26.0–34.0)
MCHC: 28.6 g/dL — ABNORMAL LOW (ref 30.0–36.0)
MCV: 81.5 fL (ref 80.0–100.0)
Platelets: 204 10*3/uL (ref 150–400)
RBC: 4.54 MIL/uL (ref 4.22–5.81)
RDW: 17.1 % — ABNORMAL HIGH (ref 11.5–15.5)
WBC: 12.1 10*3/uL — ABNORMAL HIGH (ref 4.0–10.5)
nRBC: 0 % (ref 0.0–0.2)

## 2018-09-05 LAB — GLUCOSE, CAPILLARY
GLUCOSE-CAPILLARY: 139 mg/dL — AB (ref 70–99)
GLUCOSE-CAPILLARY: 200 mg/dL — AB (ref 70–99)
Glucose-Capillary: 165 mg/dL — ABNORMAL HIGH (ref 70–99)

## 2018-09-05 SURGERY — ECHOCARDIOGRAM, TRANSESOPHAGEAL
Anesthesia: Monitor Anesthesia Care

## 2018-09-05 SURGERY — RIGHT HEART CATH
Anesthesia: LOCAL

## 2018-09-05 MED ORDER — HEPARIN (PORCINE) IN NACL 1000-0.9 UT/500ML-% IV SOLN
INTRAVENOUS | Status: AC
  Filled 2018-09-05: qty 500

## 2018-09-05 MED ORDER — LIDOCAINE HCL (PF) 1 % IJ SOLN
INTRAMUSCULAR | Status: DC | PRN
  Administered 2018-09-05: 2 mL

## 2018-09-05 MED ORDER — GLYCOPYRROLATE PF 0.2 MG/ML IJ SOSY
PREFILLED_SYRINGE | INTRAMUSCULAR | Status: DC | PRN
  Administered 2018-09-05: .1 mg via INTRAVENOUS

## 2018-09-05 MED ORDER — KETAMINE HCL 50 MG/5ML IJ SOSY
PREFILLED_SYRINGE | INTRAMUSCULAR | Status: AC
  Filled 2018-09-05: qty 5

## 2018-09-05 MED ORDER — LIDOCAINE HCL (PF) 4 % IJ SOLN
INTRAMUSCULAR | Status: AC
  Filled 2018-09-05: qty 5

## 2018-09-05 MED ORDER — SODIUM CHLORIDE 0.9 % IV SOLN
INTRAVENOUS | Status: DC
  Administered 2018-09-05: 12:00:00 via INTRAVENOUS

## 2018-09-05 MED ORDER — SODIUM CHLORIDE 0.9 % IV SOLN
INTRAVENOUS | Status: DC | PRN
  Administered 2018-09-05: 12:00:00 via INTRAVENOUS

## 2018-09-05 MED ORDER — LIDOCAINE HCL (PF) 1 % IJ SOLN
INTRAMUSCULAR | Status: AC
  Filled 2018-09-05: qty 30

## 2018-09-05 MED ORDER — SODIUM CHLORIDE 0.9 % IV SOLN: INTRAVENOUS | Status: DC

## 2018-09-05 MED ORDER — HEPARIN (PORCINE) IN NACL 1000-0.9 UT/500ML-% IV SOLN
INTRAVENOUS | Status: DC | PRN
  Administered 2018-09-05: 500 mL

## 2018-09-05 MED ORDER — MIDAZOLAM HCL 5 MG/5ML IJ SOLN
INTRAMUSCULAR | Status: DC | PRN
  Administered 2018-09-05 (×2): 0.5 mg via INTRAVENOUS
  Administered 2018-09-05: 1 mg via INTRAVENOUS

## 2018-09-05 MED ORDER — KETAMINE HCL 10 MG/ML IJ SOLN
INTRAMUSCULAR | Status: DC | PRN
  Administered 2018-09-05: 30 mg via INTRAVENOUS
  Administered 2018-09-05: 20 mg via INTRAVENOUS

## 2018-09-05 MED ORDER — POTASSIUM CHLORIDE CRYS ER 20 MEQ PO TBCR
40.0000 meq | EXTENDED_RELEASE_TABLET | Freq: Once | ORAL | Status: AC
  Administered 2018-09-05: 40 meq via ORAL
  Filled 2018-09-05: qty 2

## 2018-09-05 MED ORDER — LIDOCAINE VISCOUS HCL 2 % MT SOLN
OROMUCOSAL | Status: AC
  Filled 2018-09-05: qty 15

## 2018-09-05 MED ORDER — FUROSEMIDE 10 MG/ML IJ SOLN
120.0000 mg | Freq: Two times a day (BID) | INTRAVENOUS | Status: DC
  Administered 2018-09-05 – 2018-09-06 (×3): 120 mg via INTRAVENOUS
  Filled 2018-09-05: qty 12
  Filled 2018-09-05: qty 10
  Filled 2018-09-05: qty 12
  Filled 2018-09-05: qty 10

## 2018-09-05 MED ORDER — ONDANSETRON HCL 4 MG/2ML IJ SOLN
INTRAMUSCULAR | Status: DC | PRN
  Administered 2018-09-05: 4 mg via INTRAVENOUS

## 2018-09-05 MED ORDER — ALBUTEROL SULFATE HFA 108 (90 BASE) MCG/ACT IN AERS
INHALATION_SPRAY | RESPIRATORY_TRACT | Status: DC | PRN
  Administered 2018-09-05: 4 via RESPIRATORY_TRACT

## 2018-09-05 SURGICAL SUPPLY — 8 items
CATH BALLN WEDGE 5F 110CM (CATHETERS) ×1 IMPLANT
HOVERMATT SINGLE USE (MISCELLANEOUS) ×1 IMPLANT
KIT PV (KITS) ×1 IMPLANT
PACK CARDIAC CATHETERIZATION (CUSTOM PROCEDURE TRAY) ×2 IMPLANT
SHEATH GLIDE SLENDER 4/5FR (SHEATH) ×1 IMPLANT
TRANSDUCER W/STOPCOCK (MISCELLANEOUS) ×2 IMPLANT
TUBING ART PRESS 72  MALE/FEM (TUBING) ×1
TUBING ART PRESS 72 MALE/FEM (TUBING) IMPLANT

## 2018-09-05 NOTE — Care Management Important Message (Signed)
Important Message  Patient Details  Name: [name] MRN: [id number] Date of Birth: Feb 04, 1946   Medicare Important Message Given:  Yes Patient unable to sign. Unsigned copy left at bedside.  Shaquera Ansley P Shandell Jallow 09/05/2018, 1:54 PM

## 2018-09-05 NOTE — Progress Notes (Signed)
  Echocardiogram Echocardiogram Transesophageal has been performed.  Jennette Dubin 09/05/2018, 1:33 PM

## 2018-09-05 NOTE — H&P (View-Only) (Signed)
Advanced Heart Failure Rounding Note  PCP-Cardiologist: No primary care provider on file.   Subjective:    Patient remains on oxygen, still with some dyspnea.  RHC done as below.   RHC Procedural Findings (1/21): Hemodynamics (mmHg) RA mean 15 RV 71/18 PA 68/23, mean 44 PCWP mean 20 Oxygen saturations: PA 64% AO 94% Cardiac Output (Fick) 8.01  Cardiac Index (Fick) 3.08 PVR 3.0 WU   Objective:   Weight Range: (!) 149.3 kg Body mass index is 45.91 kg/m.   Vital Signs:   Temp:  [97.8 F (36.6 C)-98.9 F (37.2 C)] 98.9 F (37.2 C) (01/21 0304) Pulse Rate:  [57-82] 82 (01/21 0808) Resp:  [18-59] 24 (01/21 0808) BP: (131-147)/(63-95) 136/87 (01/21 0808) SpO2:  [88 %-97 %] 97 % (01/21 0808) Weight:  [149.3 kg] 149.3 kg (01/21 0304) Last BM Date: 09/03/18  Weight change: Filed Weights   09/03/18 0529 09/04/18 0532 09/05/18 0304  Weight: (!) 148.6 kg (!) 149.6 kg (!) 149.3 kg    Intake/Output:   Intake/Output Summary (Last 24 hours) at 09/05/2018 0815 Last data filed at 09/05/2018 0600 Gross per 24 hour  Intake 942.42 ml  Output 2475 ml  Net -1532.58 ml      Physical Exam    General:  Well appearing. No resp difficulty HEENT: Normal Neck: Thick, JVP difficult. Carotids 2+ bilat; no bruits. No lymphadenopathy or thyromegaly appreciated. Cor: PMI nonpalpable. Regular rate & rhythm. No rubs, gallops or murmurs. Lungs: Crackles dependently.  Abdomen: Soft, nontender, nondistended. No hepatosplenomegaly. No bruits or masses. Good bowel sounds. Extremities: No cyanosis, clubbing, rash.  1+ chronic lower extremity edema.  Neuro: Alert & orientedx3, cranial nerves grossly intact. moves all 4 extremities w/o difficulty. Affect pleasant   Telemetry   NSR, personally reviewed.    Labs    CBC Recent Labs    09/05/18 0505  WBC 12.1*  HGB 10.6*  HCT 37.0*  MCV 81.5  PLT 782   Basic Metabolic Panel Recent Labs    09/04/18 0543 09/05/18 0505  NA  138 139  K 3.8 3.2*  CL 95* 96*  CO2 30 32  GLUCOSE 152* 157*  BUN 74* 78*  CREATININE 2.37* 2.50*  CALCIUM 8.9 8.9   Liver Function Tests No results for input(s): AST, ALT, ALKPHOS, BILITOT, PROT, ALBUMIN in the last 72 hours. No results for input(s): LIPASE, AMYLASE in the last 72 hours. Cardiac Enzymes No results for input(s): CKTOTAL, CKMB, CKMBINDEX, TROPONINI in the last 72 hours.  BNP: BNP (last 3 results) Recent Labs    08/31/18 1406  BNP 151.0*    ProBNP (last 3 results) No results for input(s): PROBNP in the last 8760 hours.   D-Dimer No results for input(s): DDIMER in the last 72 hours. Hemoglobin A1C No results for input(s): HGBA1C in the last 72 hours. Fasting Lipid Panel No results for input(s): CHOL, HDL, LDLCALC, TRIG, CHOLHDL, LDLDIRECT in the last 72 hours. Thyroid Function Tests No results for input(s): TSH, T4TOTAL, T3FREE, THYROIDAB in the last 72 hours.  Invalid input(s): FREET3  Other results:   Imaging     No results found.   Medications:     Scheduled Medications: . [MAR Hold] amLODipine  10 mg Oral Daily  . [MAR Hold] aspirin EC  325 mg Oral Daily  . [MAR Hold] atorvastatin  40 mg Oral Daily  . [MAR Hold] cholecalciferol  1,000 Units Oral Daily  . [MAR Hold] famotidine  20 mg Oral Daily  . [facility]  Hold] febuxostat  80 mg Oral Daily  . [MAR Hold] gabapentin  300 mg Oral BID  . [MAR Hold] heparin  5,000 Units Subcutaneous Q8H  . [MAR Hold] hydrALAZINE  50 mg Oral TID  . [MAR Hold] insulin aspart  0-15 Units Subcutaneous TID WC  . [MAR Hold] insulin glargine  20 Units Subcutaneous QHS  . [MAR Hold] metolazone  2.5 mg Oral Daily  . [MAR Hold] metoprolol succinate  50 mg Oral Daily  . [MAR Hold] senna-docusate  1 tablet Oral BID  . sodium chloride flush  3 mL Intravenous Q12H  . [MAR Hold] torsemide  120 mg Oral BID     Infusions: . sodium chloride    . sodium chloride 10 mL/hr at 09/05/18 0543     PRN  Medications:  sodium chloride, [MAR Hold] acetaminophen **OR** [MAR Hold] acetaminophen, [MAR Hold] Melatonin, [MAR Hold] ondansetron **OR** [MAR Hold] ondansetron (ZOFRAN) IV, [MAR Hold] polyethylene glycol, [MAR Hold] polyvinyl alcohol, sodium chloride flush   Assessment/Plan   1. Acute on chronic diastolic CHF with prominent RV failure: Echo this admission with EF 60-65%, AS appears at least moderate and possibly severe, mildly decreased RV systolic function with D-shaped interventricular septum.He has diuresed some in the hospital.  Volume status is very difficult to assess on exam. RHC today showed ongoing R>L heart failure with mixed pulmonary venous/pulmonary arterial hypertension.  Creatinine up a little today to 2.5. With ongoing oxygen requirement and dyspnea, will try to diurese further today.  - Lasix 120 mg IV bid today with a dose of metolazone.  - Follow creatinine closely.  2. Aortic stenosis: I reviewed echo this admission, somewhat difficult study.  There is at least moderate, possibly severe AS.  It is possible that severe AS is making volume management difficult.  - He will have a TEE later today to assess the valve.   - He will be difficult candidate for TAVR given comorbidities but hopefully would be feasible if needed.  3. Atrial fibrillation: Paroxysmal.  He has history of CVA. He has been in NSR this admission. He is not anticoagulated (takes ASA) due to prior intracerebral hemorrhage. 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   - Continue Toprol XL 50 mg daily.  4. CAD: Nonobstructive.No chest pain. - Continue ASA and statin.  5. 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 CKD: Stage III. Creatinine 2.5 today, mildly higher.  Watch closely with diuresis.  7. OHS/OSA: Patient has OSA, on CPAP at night.  Suspect OHS.  8. Pulmonary hypertension: He has mixed pulmonary venous/pulmonary arterial HTN.  Suspect PAH component is due to  OHS/OSA.   Length of Stay: JAARS, MD  09/05/2018, 8:15 AM  Advanced Heart Failure Team Pager [phone] (M-F; [place])  Please contact [facility] for night-coverage after hours (4p -7a ) and weekends on [email or web address]

## 2018-09-05 NOTE — Progress Notes (Signed)
PROGRESS NOTE    [name]  XIP:[id number] DOB: 11/24/1945 DOA: 08/31/2018 PCP: [name], MD    Brief Narrative:  73 year old male who has significant medical history for diastolic heart failure due to hypertensive cardiomyopathy, complicated by morbid obesity, diabetes, chronic kidney disease stage 3and obstructive sleep apnea. For the last 2 weeks he has beendeveloping worsening and progressive dyspnea to the point where he was found hypoxic.He does have a very poor functional physical capacity and there is significant concerns about his compliance with his heart failure regimen. On his initial physical examination his temperature 98.5, blood pressure 147/84, heart rate 75, respiratory rate 24, oxygen saturation 98%on supplemental oxygen per nasal cannula, on room air sitting 93%, ambulating 87%. He had positive JVD, Rales and rhonchi bilaterally, he does have a systolic murmur at the right upper sternal border, his abdomen was protuberant, he does have++++pitting bilateral strength edema.Sodium 142, potassium 3.5, chloride 100, bicarb 29, glucose 110, BUN 63, creatinine 2.35, BNP 151, white count 12.3, hemoglobin 12.3, hematocrit 43.4, platelets 226, venous blood gas pH 7.45.Chest x-ray has cardiomegaly, increase in central markings bilaterally, pacemaker on the right side.  Patient wasadmitted to the hospital with a working diagnosis of acute on chronichypoxic respiratory failure due to acute cardiogenic pulmonary edema due to decompensated acuteonchronic diastolic heart failure.   Assessment & Plan:   Active Problems:   Diabetes mellitus due to underlying condition, controlled, with stage 3 chronic kidney disease, with long-term current use of insulin (HCC)   Hypercholesterolemia   Essential hypertension   COPD (chronic obstructive pulmonary disease) (HCC)   Coronary artery disease involving native coronary artery of native heart without angina pectoris  OSA (obstructive sleep apnea)   PAF (paroxysmal atrial fibrillation) (HCC)   CKD (chronic kidney disease) stage 3, GFR 30-59 ml/min (HCC)   Heart failure ([place])   1.Acute on chronic diastolic heart failure exacerbation. Ejection fraction 60 to 65%. Urine output over last 24 H 2,475 ml, this am had right heart catheterization PCWP up to 20 mmHg, changed torsemide to furosemide 120 mg IV BID, and continue metolazone, follow on volume status.Continue heart failure regimen with metoprolol and hydralazine.   2.Acute on chronic hypoxic respiratory failure due to acute cardiogenic pulmonary edema. Patient continue to need supplemental 02 per [place], will continue aggressive diuresis with IV furosemide, continue cpap at night.    3.Severe aortic stenosis. Patient will have TEE for re-evaluation of aortic valve. TTE with measurements consistent with sever aortic stenosis, progressive disease. Continue diuresis with furosemide IV.  4.Paroxysmal atrial fibrillation.Continue with metoprolol for rate control, not on full anticoagulation due to hx of intracranial bleed.   5.Hypertension. Continue metoprolol,hydralazine and amlodipine, for blood pressure control.   6.Acute kidney injury chronic kidney stage III with hypokalemia. Renal function with serum cr at 2,50 with K at 3,2 and serum bicarbonate at 32, will continue aggressive diuresis and follow up with renal panel in am, avoid hypotension or nephrotoxic medications.   7.Type 2 diabetes mellitus. Fasting glucose 157, he is on a reduced dose of long acting insulin compared to his home regimen, continue insulin sliding scale for glucose cover and monitoring. Patient is tolerating po well.  8.Morbid obesity complicated by obstructive sleep apnea.Continue to use cPAP at night and for naps, elevated calculated BMI 45.6. Will need outpatient follow up.   9.History of CVA.He does have limited physical functional capacity, will  need home health services, continue with medical therapy with aspirin and statin.   DVT prophylaxis:  scd   Code Status:  full Family Communication: I spoke with patient's family at the bedside and all questions were addressed.  Disposition Plan/ discharge barriers: pending cardiology evaluation.   Body mass index is 45.91 kg/m. Malnutrition Type:      Malnutrition Characteristics:      Nutrition Interventions:     RN Pressure Injury Documentation:     Consultants:   Cardiology   Procedures:   Right heart cath  TEE  Antimicrobials:       Subjective: Patient somnolent this am, reported continue to improve dyspnea, no chest pain, no nausea or vomiting, continue to have lower extremity edema.   Objective: Vitals:   09/05/18 0930 09/05/18 1000 09/05/18 1030 09/05/18 1139  BP: (!) 141/89 136/89 127/71 (!) 153/87  Pulse: 64 73 (!) 55 74  Resp:    (!) 28  Temp:    98.5 F (36.9 C)  TempSrc:    Oral  SpO2:    95%  Weight:      Height:        Intake/Output Summary (Last 24 hours) at 09/05/2018 1249 Last data filed at 09/05/2018 1249 Gross per 24 hour  Intake 1202.42 ml  Output 3225 ml  Net -2022.58 ml   Filed Weights   09/03/18 0529 09/04/18 0532 09/05/18 0304  Weight: (!) 148.6 kg (!) 149.6 kg (!) 149.3 kg    Examination:   General: deconditioned and ill looking appearing  Neurology: Awake and alert, non focal  E ENT: no pallor, no icterus, oral mucosa moist/ wide neck Cardiovascular: No JVD (wide neck). S1-S2 present, rhythmic, no gallops, rubs, or murmurs. +++ bilateral lower extremity edema. Pulmonary: decreased breath sounds bilaterally, decreased air movement, no wheezing, rhonchi or rales. Gastrointestinal. Abdomen protuberant with no organomegaly, non tender, no rebound or guarding Skin. Legs bilateral discoloration Musculoskeletal: no joint deformities     Data Reviewed: I have personally reviewed following labs and imaging  studies  CBC: Recent Labs  Lab 08/31/18 1406 09/05/18 0505  WBC 12.3* 12.1*  NEUTROABS 9.2*  --   HGB 12.3* 10.6*  HCT 43.4 37.0*  MCV 82.2 81.5  PLT 226 182   Basic Metabolic Panel: Recent Labs  Lab 09/01/18 0454 09/02/18 0532 09/03/18 0422 09/04/18 0543 09/05/18 0505  NA 143 141 138 138 139  K 3.6 3.0* 3.3* 3.8 3.2*  CL 102 96* 96* 95* 96*  CO2 28 32 31 30 32  GLUCOSE 70 117* 150* 152* 157*  BUN 66* 66* 70* 74* 78*  CREATININE 2.35* 2.42* 2.52* 2.37* 2.50*  CALCIUM 8.8* 8.7* 8.5* 8.9 8.9   GFR: Estimated Creatinine Clearance: 40.8 mL/min (A) (by C-G formula based on SCr of 2.5 mg/dL (H)). Liver Function Tests: No results for input(s): AST, ALT, ALKPHOS, BILITOT, PROT, ALBUMIN in the last 168 hours. No results for input(s): LIPASE, AMYLASE in the last 168 hours. No results for input(s): AMMONIA in the last 168 hours. Coagulation Profile: No results for input(s): INR, PROTIME in the last 168 hours. Cardiac Enzymes: No results for input(s): CKTOTAL, CKMB, CKMBINDEX, TROPONINI in the last 168 hours. BNP (last 3 results) No results for input(s): PROBNP in the last 8760 hours. HbA1C: No results for input(s): HGBA1C in the last 72 hours. CBG: Recent Labs  Lab 09/04/18 0737 09/04/18 1246 09/04/18 1719 09/04/18 2111 09/05/18 0829  GLUCAP 154* 237* 202* 206* 139*   Lipid Profile: No results for input(s): CHOL, HDL, LDLCALC, TRIG, CHOLHDL, LDLDIRECT in the last 72 hours. Thyroid Function Tests:  No results for input(s): TSH, T4TOTAL, FREET4, T3FREE, THYROIDAB in the last 72 hours. Anemia Panel: No results for input(s): VITAMINB12, FOLATE, FERRITIN, TIBC, IRON, RETICCTPCT in the last 72 hours.    Radiology Studies: I have reviewed all of the imaging during this hospital visit personally     Scheduled Meds: . [MAR Hold] amLODipine  10 mg Oral Daily  . [MAR Hold] aspirin EC  325 mg Oral Daily  . [MAR Hold] atorvastatin  40 mg Oral Daily  . [MAR Hold]  cholecalciferol  1,000 Units Oral Daily  . [MAR Hold] famotidine  20 mg Oral Daily  . [MAR Hold] febuxostat  80 mg Oral Daily  . [MAR Hold] gabapentin  300 mg Oral BID  . [MAR Hold] heparin  5,000 Units Subcutaneous Q8H  . [MAR Hold] hydrALAZINE  50 mg Oral TID  . [MAR Hold] insulin aspart  0-15 Units Subcutaneous TID WC  . [MAR Hold] insulin glargine  20 Units Subcutaneous QHS  . lidocaine      . [MAR Hold] metolazone  2.5 mg Oral Daily  . [MAR Hold] metoprolol succinate  50 mg Oral Daily  . [MAR Hold] senna-docusate  1 tablet Oral BID   Continuous Infusions: . sodium chloride    . sodium chloride 10 mL/hr at 09/05/18 1152  . [MAR Hold] furosemide       LOS: 5 days        Lizvet Chunn Gerome Apley, MD [facility] Pager [phone]

## 2018-09-05 NOTE — Interval H&P Note (Signed)
History and Physical Interval Note:  09/05/2018 7:48 AM  [name]  has presented today for surgery, with the diagnosis of HF  The various methods of treatment have been discussed with the patient and family. After consideration of risks, benefits and other options for treatment, the patient has consented to  Procedure(s): RIGHT HEART CATH (N/A) as a surgical intervention .  The patient's history has been reviewed, patient examined, no change in status, stable for surgery.  I have reviewed the patient's chart and labs.  Questions were answered to the patient's satisfaction.     Susannah Carbin [employer]

## 2018-09-05 NOTE — CV Procedure (Signed)
Procedure: TEE  Indication: Aortic stenosis  Sedation: Per anesthesiology  Findings: Please see echo section for full report. Images were limited as oxygen saturation was dropping so we did a rapid study.  Normal LV size with moderate LV hypertrophy, EF 55-60%. Normal wall motion.  Normal RV size and systolic function.  Mild left atrial enlargement, no LA appendage thrombus.  Normal right atrium.  Trivial TR.  Trivial MR.  The aortic valve was severely calcified.  AVA by planimetry was 0.5 cm^2.  Mean gradient 35 mmHg with AVA 1.0 cm^2 by continuity equation.  Normal caliber aorta with mild plaque in the descending thoracic aorta.   Impression: I think that aortic stenosis is severe, though only calculates to moderate-severe by continuity equation (visually and by planimetry think severe).   Loralie Champagne 09/05/2018 12:54 PM

## 2018-09-05 NOTE — Interval H&P Note (Signed)
History and Physical Interval Note:  09/05/2018 12:32 PM  [name]  has presented today for surgery, with the diagnosis of congestive heart failure  The various methods of treatment have been discussed with the patient and family. After consideration of risks, benefits and other options for treatment, the patient has consented to  Procedure(s): TRANSESOPHAGEAL ECHOCARDIOGRAM (TEE) (N/A) as a surgical intervention .  The patient's history has been reviewed, patient examined, no change in status, stable for surgery.  I have reviewed the patient's chart and labs.  Questions were answered to the patient's satisfaction.     [name]

## 2018-09-05 NOTE — Transfer of Care (Signed)
Immediate Anesthesia Transfer of Care Note  Patient: [name]  Procedure(s) Performed: TRANSESOPHAGEAL ECHOCARDIOGRAM (TEE) (N/A )  Patient Location: Endoscopy Unit  Anesthesia Type:MAC  Level of Consciousness: drowsy and patient cooperative  Airway & Oxygen Therapy: Patient Spontanous Breathing and Patient connected to face mask oxygen  Post-op Assessment: Report given to RN and Post -op Vital signs reviewed and stable  Post vital signs: Reviewed and stable  Last Vitals:  Vitals Value Taken Time  BP 152/84 09/05/2018 12:57 PM  Temp    Pulse 76 09/05/2018 12:58 PM  Resp 24 09/05/2018 12:58 PM  SpO2 99 % 09/05/2018 12:58 PM  Vitals shown include unvalidated device data.  Last Pain:  Vitals:   09/05/18 1257  TempSrc: Oral  PainSc: 0-No pain         Complications: No apparent anesthesia complications

## 2018-09-05 NOTE — Progress Notes (Signed)
Patient asked to be placed on CPAP: RT placed pt on CPAP via FFM on Auto 18 cmH2O max and 10 cmH2O min.  RT made RN aware that patient was ready to go on CPAP at this time.     09/05/18 2015  BiPAP/CPAP/SIPAP  BiPAP/CPAP/SIPAP Pt Type Adult  Mask Type Full face mask  Mask Size Extra large  Respiratory Rate 18 breaths/min  EPAP  (Auto 18 cmH2O max and 10 cmH2O min)  Flow Rate 2 lpm  BiPAP/CPAP/SIPAP CPAP  Patient Home Equipment No  Auto Titrate Yes (Auto 18 cmH2O max and 10 cmH2O min)  BiPAP/CPAP /SiPAP Vitals  Pulse Rate 74  Resp 18  SpO2 98 %

## 2018-09-05 NOTE — Anesthesia Postprocedure Evaluation (Signed)
Anesthesia Post Note  Patient: Yeriel Mineo  Procedure(s) Performed: TRANSESOPHAGEAL ECHOCARDIOGRAM (TEE) (N/A )     Patient location during evaluation: Endoscopy Anesthesia Type: MAC Level of consciousness: awake and alert Pain management: pain level controlled Vital Signs Assessment: post-procedure vital signs reviewed and stable Respiratory status: spontaneous breathing, nonlabored ventilation, respiratory function stable and patient connected to nasal cannula oxygen Cardiovascular status: blood pressure returned to baseline and stable Postop Assessment: no apparent nausea or vomiting Anesthetic complications: no    Last Vitals:  Vitals:   09/05/18 1310 09/05/18 1329  BP: 134/73 115/61  Pulse: 71 79  Resp: (!) 24   Temp:  37.2 C  SpO2: 94% 93%    Last Pain:  Vitals:   09/05/18 1329  TempSrc: Oral  PainSc:                  Barnet Glasgow

## 2018-09-05 NOTE — Plan of Care (Signed)
  Problem: Education: Goal: Knowledge of General Education information will improve Description Including pain rating scale, medication(s)/side effects and non-pharmacologic comfort measures Outcome: Progressing   Problem: Clinical Measurements: Goal: Ability to maintain clinical measurements within normal limits will improve Outcome: Progressing   Problem: Clinical Measurements: Goal: Diagnostic test results will improve Outcome: Progressing   Problem: Safety: Goal: Ability to remain free from injury will improve Outcome: Progressing   Problem: Skin Integrity: Goal: Risk for impaired skin integrity will decrease Outcome: Progressing   Problem: Activity: Goal: Capacity to carry out activities will improve Outcome: Progressing   Problem: Cardiac: Goal: Ability to achieve and maintain adequate cardiopulmonary perfusion will improve Outcome: Progressing

## 2018-09-05 NOTE — Anesthesia Preprocedure Evaluation (Signed)
Anesthesia Evaluation  Patient identified by MRN, date of birth, ID band Patient awake    Reviewed: Allergy & Precautions, H&P , NPO status , Patient's Chart, lab work & pertinent test results  Airway Mallampati: IV  TM Distance: <3 FB Neck ROM: Full    Dental no notable dental hx. (+) Teeth Intact, Dental Advisory Given   Pulmonary shortness of breath, sleep apnea , COPD, former smoker,    Pulmonary exam normal breath sounds clear to auscultation       Cardiovascular hypertension, + CAD  Normal cardiovascular exam+ dysrhythmias + pacemaker  Rhythm:Regular Rate:Normal  Echo 09/01/2018 Left ventricle: The cavity size was normal. Wall thickness was   increased in a pattern of moderate LVH. Systolic function was   normal. The estimated ejection fraction was in the range of 60%   to 65%. Wall motion was normal; there were no regional wall   motion abnormalities. - Aortic valve: There was severe stenosis. Valve area (VTI): 1.32   cm^2. Valve area (Vmax): 1.38 cm^2. Valve area (Vmean): 1.23   cm^2. - Left atrium: The atrium was mildly dilated. - Right atrium: The atrium was moderately dilated.   Neuro/Psych    GI/Hepatic Neg liver ROS, GERD  ,  Endo/Other  diabetes  Renal/GU Renal diseaseCr 2.5     Musculoskeletal  (+) Arthritis ,   Abdominal (+) + obese,   Peds  Hematology Hgb 10.6   Anesthesia Other Findings   Reproductive/Obstetrics                             Lab Results  Component Value Date   CREATININE 2.50 (H) 09/05/2018   BUN 78 (H) 09/05/2018   NA 139 09/05/2018   K 3.2 (L) 09/05/2018   CL 96 (L) 09/05/2018   CO2 32 09/05/2018    Lab Results  Component Value Date   WBC 12.1 (H) 09/05/2018   HGB 10.6 (L) 09/05/2018   HCT 37.0 (L) 09/05/2018   MCV 81.5 09/05/2018   PLT 204 09/05/2018    Anesthesia Physical Anesthesia Plan  ASA: IV  Anesthesia Plan: MAC   Post-op  Pain Management:    Induction: Intravenous  PONV Risk Score and Plan: Treatment may vary due to age or medical condition  Airway Management Planned: Nasal Cannula, Natural Airway and Video Laryngoscope Planned  Additional Equipment:   Intra-op Plan:   Post-operative Plan:   Informed Consent: I have reviewed the patients History and Physical, chart, labs and discussed the procedure including the risks, benefits and alternatives for the proposed anesthesia with the patient or authorized representative who has indicated his/her understanding and acceptance.     Dental advisory given  Plan Discussed with: CRNA  Anesthesia Plan Comments: (Lido nebulizer, propafol ketamine  Glide scope available)        Anesthesia Quick Evaluation

## 2018-09-05 NOTE — Progress Notes (Signed)
Advanced Heart Failure Rounding Note  PCP-Cardiologist: No primary care provider on file.   Subjective:    Patient remains on oxygen, still with some dyspnea.  RHC done as below.   RHC Procedural Findings (1/21): Hemodynamics (mmHg) RA mean 15 RV 71/18 PA 68/23, mean 44 PCWP mean 20 Oxygen saturations: PA 64% AO 94% Cardiac Output (Fick) 8.01  Cardiac Index (Fick) 3.08 PVR 3.0 WU   Objective:   Weight Range: (!) 149.3 kg Body mass index is 45.91 kg/m.   Vital Signs:   Temp:  [97.8 F (36.6 C)-98.9 F (37.2 C)] 98.9 F (37.2 C) (01/21 0304) Pulse Rate:  [57-82] 82 (01/21 0808) Resp:  [18-59] 24 (01/21 0808) BP: (131-147)/(63-95) 136/87 (01/21 0808) SpO2:  [88 %-97 %] 97 % (01/21 0808) Weight:  [149.3 kg] 149.3 kg (01/21 0304) Last BM Date: 09/03/18  Weight change: Filed Weights   09/03/18 0529 09/04/18 0532 09/05/18 0304  Weight: (!) 148.6 kg (!) 149.6 kg (!) 149.3 kg    Intake/Output:   Intake/Output Summary (Last 24 hours) at 09/05/2018 0815 Last data filed at 09/05/2018 0600 Gross per 24 hour  Intake 942.42 ml  Output 2475 ml  Net -1532.58 ml      Physical Exam    General:  Well appearing. No resp difficulty HEENT: Normal Neck: Thick, JVP difficult. Carotids 2+ bilat; no bruits. No lymphadenopathy or thyromegaly appreciated. Cor: PMI nonpalpable. Regular rate & rhythm. No rubs, gallops or murmurs. Lungs: Crackles dependently.  Abdomen: Soft, nontender, nondistended. No hepatosplenomegaly. No bruits or masses. Good bowel sounds. Extremities: No cyanosis, clubbing, rash.  1+ chronic lower extremity edema.  Neuro: Alert & orientedx3, cranial nerves grossly intact. moves all 4 extremities w/o difficulty. Affect pleasant   Telemetry   NSR, personally reviewed.    Labs    CBC Recent Labs    09/05/18 0505  WBC 12.1*  HGB 10.6*  HCT 37.0*  MCV 81.5  PLT 027   Basic Metabolic Panel Recent Labs    09/04/18 0543 09/05/18 0505  NA  138 139  K 3.8 3.2*  CL 95* 96*  CO2 30 32  GLUCOSE 152* 157*  BUN 74* 78*  CREATININE 2.37* 2.50*  CALCIUM 8.9 8.9   Liver Function Tests No results for input(s): AST, ALT, ALKPHOS, BILITOT, PROT, ALBUMIN in the last 72 hours. No results for input(s): LIPASE, AMYLASE in the last 72 hours. Cardiac Enzymes No results for input(s): CKTOTAL, CKMB, CKMBINDEX, TROPONINI in the last 72 hours.  BNP: BNP (last 3 results) Recent Labs    08/31/18 1406  BNP 151.0*    ProBNP (last 3 results) No results for input(s): PROBNP in the last 8760 hours.   D-Dimer No results for input(s): DDIMER in the last 72 hours. Hemoglobin A1C No results for input(s): HGBA1C in the last 72 hours. Fasting Lipid Panel No results for input(s): CHOL, HDL, LDLCALC, TRIG, CHOLHDL, LDLDIRECT in the last 72 hours. Thyroid Function Tests No results for input(s): TSH, T4TOTAL, T3FREE, THYROIDAB in the last 72 hours.  Invalid input(s): FREET3  Other results:   Imaging     No results found.   Medications:     Scheduled Medications: . [MAR Hold] amLODipine  10 mg Oral Daily  . [MAR Hold] aspirin EC  325 mg Oral Daily  . [MAR Hold] atorvastatin  40 mg Oral Daily  . [MAR Hold] cholecalciferol  1,000 Units Oral Daily  . [MAR Hold] famotidine  20 mg Oral Daily  . [facility]  Hold] febuxostat  80 mg Oral Daily  . [MAR Hold] gabapentin  300 mg Oral BID  . [MAR Hold] heparin  5,000 Units Subcutaneous Q8H  . [MAR Hold] hydrALAZINE  50 mg Oral TID  . [MAR Hold] insulin aspart  0-15 Units Subcutaneous TID WC  . [MAR Hold] insulin glargine  20 Units Subcutaneous QHS  . [MAR Hold] metolazone  2.5 mg Oral Daily  . [MAR Hold] metoprolol succinate  50 mg Oral Daily  . [MAR Hold] senna-docusate  1 tablet Oral BID  . sodium chloride flush  3 mL Intravenous Q12H  . [MAR Hold] torsemide  120 mg Oral BID     Infusions: . sodium chloride    . sodium chloride 10 mL/hr at 09/05/18 0543     PRN  Medications:  sodium chloride, [MAR Hold] acetaminophen **OR** [MAR Hold] acetaminophen, [MAR Hold] Melatonin, [MAR Hold] ondansetron **OR** [MAR Hold] ondansetron (ZOFRAN) IV, [MAR Hold] polyethylene glycol, [MAR Hold] polyvinyl alcohol, sodium chloride flush   Assessment/Plan   1. Acute on chronic diastolic CHF with prominent RV failure: Echo this admission with EF 60-65%, AS appears at least moderate and possibly severe, mildly decreased RV systolic function with D-shaped interventricular septum.He has diuresed some in the hospital.  Volume status is very difficult to assess on exam. RHC today showed ongoing R>L heart failure with mixed pulmonary venous/pulmonary arterial hypertension.  Creatinine up a little today to 2.5. With ongoing oxygen requirement and dyspnea, will try to diurese further today.  - Lasix 120 mg IV bid today with a dose of metolazone.  - Follow creatinine closely.  2. Aortic stenosis: I reviewed echo this admission, somewhat difficult study.  There is at least moderate, possibly severe AS.  It is possible that severe AS is making volume management difficult.  - He will have a TEE later today to assess the valve.   - He will be difficult candidate for TAVR given comorbidities but hopefully would be feasible if needed.  3. Atrial fibrillation: Paroxysmal.  He has history of CVA. He has been in NSR this admission. He is not anticoagulated (takes ASA) due to prior intracerebral hemorrhage. 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   - Continue Toprol XL 50 mg daily.  4. CAD: Nonobstructive.No chest pain. - Continue ASA and statin.  5. 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 CKD: Stage III. Creatinine 2.5 today, mildly higher.  Watch closely with diuresis.  7. OHS/OSA: Patient has OSA, on CPAP at night.  Suspect OHS.  8. Pulmonary hypertension: He has mixed pulmonary venous/pulmonary arterial HTN.  Suspect PAH component is due to  OHS/OSA.   Length of Stay: [place], [place]  09/05/2018, 8:15 AM  Advanced Heart Failure Team Pager [phone] (M-F; [place])  Please contact [facility] for night-coverage after hours (4p -7a ) and weekends on [email or web address]

## 2018-09-05 NOTE — Progress Notes (Signed)
PT Cancellation Note  Patient Details Name: Marian Grandt MRN: [id number] DOB: [birth year]   Cancelled Treatment:    Reason Eval/Treat Not Completed: Patient at procedure or test/unavailable. Will follow-up for PT treatment as schedule permits.  Mabeline Caras, PT, DPT Acute Rehabilitation Services  Pager [phone] Office [place] 09/05/2018, 7:35 AM

## 2018-09-06 ENCOUNTER — Encounter (HOSPITAL_COMMUNITY): Payer: Self-pay | Admitting: Cardiology

## 2018-09-06 LAB — GLUCOSE, CAPILLARY
GLUCOSE-CAPILLARY: 181 mg/dL — AB (ref 70–99)
Glucose-Capillary: 146 mg/dL — ABNORMAL HIGH (ref 70–99)
Glucose-Capillary: 183 mg/dL — ABNORMAL HIGH (ref 70–99)

## 2018-09-06 LAB — POCT I-STAT 3, VENOUS BLOOD GAS (G3P V)
Acid-Base Excess: 11 mmol/L — ABNORMAL HIGH (ref 0.0–2.0)
Acid-Base Excess: 11 mmol/L — ABNORMAL HIGH (ref 0.0–2.0)
Bicarbonate: 38.1 mmol/L — ABNORMAL HIGH (ref 20.0–28.0)
Bicarbonate: 38.2 mmol/L — ABNORMAL HIGH (ref 20.0–28.0)
O2 SAT: 64 %
O2 Saturation: 64 %
TCO2: 40 mmol/L — ABNORMAL HIGH (ref 22–32)
TCO2: 40 mmol/L — ABNORMAL HIGH (ref 22–32)
pCO2, Ven: 60 mmHg (ref 44.0–60.0)
pCO2, Ven: 60.5 mmHg — ABNORMAL HIGH (ref 44.0–60.0)
pH, Ven: 7.408 (ref 7.250–7.430)
pH, Ven: 7.411 (ref 7.250–7.430)
pO2, Ven: 34 mmHg (ref 32.0–45.0)
pO2, Ven: 34 mmHg (ref 32.0–45.0)

## 2018-09-06 LAB — BASIC METABOLIC PANEL
Anion gap: 12 (ref 5–15)
BUN: 80 mg/dL — ABNORMAL HIGH (ref 8–23)
CO2: 31 mmol/L (ref 22–32)
Calcium: 8.9 mg/dL (ref 8.9–10.3)
Chloride: 96 mmol/L — ABNORMAL LOW (ref 98–111)
Creatinine, Ser: 2.57 mg/dL — ABNORMAL HIGH (ref 0.61–1.24)
GFR calc Af Amer: 28 mL/min — ABNORMAL LOW (ref >60)
GFR calc non Af Amer: 24 mL/min — ABNORMAL LOW (ref >60)
Glucose, Bld: 173 mg/dL — ABNORMAL HIGH (ref 70–99)
POTASSIUM: 3.2 mmol/L — AB (ref 3.5–5.1)
Sodium: 139 mmol/L (ref 135–145)

## 2018-09-06 LAB — CBC WITH DIFFERENTIAL/PLATELET
ABS IMMATURE GRANULOCYTES: 0.05 10*3/uL (ref 0.00–0.07)
Basophils Absolute: 0 10*3/uL (ref 0.0–0.1)
Basophils Relative: 0 %
Eosinophils Absolute: 0.2 10*3/uL (ref 0.0–0.5)
Eosinophils Relative: 2 %
HCT: 36.8 % — ABNORMAL LOW (ref 39.0–52.0)
Hemoglobin: 10.6 g/dL — ABNORMAL LOW (ref 13.0–17.0)
IMMATURE GRANULOCYTES: 0 %
Lymphocytes Relative: 11 %
Lymphs Abs: 1.3 10*3/uL (ref 0.7–4.0)
MCH: 24.1 pg — AB (ref 26.0–34.0)
MCHC: 28.8 g/dL — ABNORMAL LOW (ref 30.0–36.0)
MCV: 83.8 fL (ref 80.0–100.0)
Monocytes Absolute: 1.2 10*3/uL — ABNORMAL HIGH (ref 0.1–1.0)
Monocytes Relative: 10 %
NEUTROS ABS: 9.1 10*3/uL — AB (ref 1.7–7.7)
NEUTROS PCT: 77 %
Platelets: 193 10*3/uL (ref 150–400)
RBC: 4.39 MIL/uL (ref 4.22–5.81)
RDW: 17.1 % — ABNORMAL HIGH (ref 11.5–15.5)
WBC: 11.9 10*3/uL — ABNORMAL HIGH (ref 4.0–10.5)
nRBC: 0 % (ref 0.0–0.2)

## 2018-09-06 MED ORDER — POTASSIUM CHLORIDE CRYS ER 20 MEQ PO TBCR
40.0000 meq | EXTENDED_RELEASE_TABLET | Freq: Two times a day (BID) | ORAL | Status: DC
  Administered 2018-09-06 – 2018-09-08 (×5): 40 meq via ORAL
  Filled 2018-09-06 (×5): qty 2

## 2018-09-06 NOTE — Progress Notes (Addendum)
Advanced Heart Failure Rounding Note  PCP-Cardiologist: No primary care provider on file.   Subjective:    Feeling slightly better this am. Denies orthopnea, but using CPAP. Mild lightheadedness with standing yesterday.   Cr 2.5 -> 2.57. BUN 80. K 3.2. Weight down 4 lbs with IV lasix 120 mg BID. Negative 1.6 L.  TEE 09/05/2018 - LVEF 55-60% - AS appears severe, though only calculates to moderate-severe by continuity equation.  - The aortic valve was severely calcified.  AVA by planimetry was 0.5 cm^2.  Mean gradient 35 mmHg with AVA 1.0 cm^2 by continuity equation.  Normal caliber aorta with mild plaque in the descending thoracic aorta.   RHC Procedural Findings (1/21): Hemodynamics (mmHg) RA mean 15 RV 71/18 PA 68/23, mean 44 PCWP mean 20 Oxygen saturations: PA 64% AO 94% Cardiac Output (Fick) 8.01  Cardiac Index (Fick) 3.08 PVR 3.0 WU   Objective:   Weight Range: (!) 147.5 kg Body mass index is 45.36 kg/m.   Vital Signs:   Temp:  [97.2 F (36.2 C)-98.9 F (37.2 C)] 97.2 F (36.2 C) (01/22 0355) Pulse Rate:  [55-82] 72 (01/22 0355) Resp:  [8-59] 18 (01/22 0355) BP: (115-153)/(54-93) 123/54 (01/22 0355) SpO2:  [90 %-100 %] 97 % (01/22 0355) Weight:  [147.5 kg] 147.5 kg (01/22 0646) Last BM Date: 09/03/18  Weight change: Filed Weights   09/04/18 0532 09/05/18 0304 09/06/18 0646  Weight: (!) 149.6 kg (!) 149.3 kg (!) 147.5 kg    Intake/Output:   Intake/Output Summary (Last 24 hours) at 09/06/2018 0749 Last data filed at 09/06/2018 0700 Gross per 24 hour  Intake 801.35 ml  Output 2350 ml  Net -1548.65 ml      Physical Exam    General: NAD HEENT: Normal Neck: Thick, JVP very difficult to assess. Carotids 2+ bilat; no bruits. No thyromegaly or nodule noted. Cor: PMI nondisplaced. RRR, 3/6 AS.  Lungs: Dependent crackles that clear slightly with cough.  Abdomen: Soft, non-tender, non-distended, no HSM. No bruits or masses. +BS  Extremities: No  cyanosis, clubbing, or rash. 1+ chronic BLE edema.  Neuro: Alert & orientedx3, cranial nerves grossly intact. moves all 4 extremities w/o difficulty. Affect pleasant   Telemetry   NSR -> V paced 60s at times, personally reviewed.   Labs    CBC Recent Labs    09/05/18 0505 09/06/18 0450  WBC 12.1* 11.9*  NEUTROABS  --  9.1*  HGB 10.6* 10.6*  HCT 37.0* 36.8*  MCV 81.5 83.8  PLT 204 811   Basic Metabolic Panel Recent Labs    09/05/18 0505 09/06/18 0450  NA 139 139  K 3.2* 3.2*  CL 96* 96*  CO2 32 31  GLUCOSE 157* 173*  BUN 78* 80*  CREATININE 2.50* 2.57*  CALCIUM 8.9 8.9   Liver Function Tests No results for input(s): AST, ALT, ALKPHOS, BILITOT, PROT, ALBUMIN in the last 72 hours. No results for input(s): LIPASE, AMYLASE in the last 72 hours. Cardiac Enzymes No results for input(s): CKTOTAL, CKMB, CKMBINDEX, TROPONINI in the last 72 hours.  BNP: BNP (last 3 results) Recent Labs    08/31/18 1406  BNP 151.0*    ProBNP (last 3 results) No results for input(s): PROBNP in the last 8760 hours.   D-Dimer No results for input(s): DDIMER in the last 72 hours. Hemoglobin A1C No results for input(s): HGBA1C in the last 72 hours. Fasting Lipid Panel No results for input(s): CHOL, HDL, LDLCALC, TRIG, CHOLHDL, LDLDIRECT in the last 72  hours. Thyroid Function Tests No results for input(s): TSH, T4TOTAL, T3FREE, THYROIDAB in the last 72 hours.  Invalid input(s): FREET3  Other results:   Imaging    No results found.   Medications:     Scheduled Medications: . amLODipine  10 mg Oral Daily  . aspirin EC  325 mg Oral Daily  . atorvastatin  40 mg Oral Daily  . cholecalciferol  1,000 Units Oral Daily  . famotidine  20 mg Oral Daily  . febuxostat  80 mg Oral Daily  . gabapentin  300 mg Oral BID  . heparin  5,000 Units Subcutaneous Q8H  . hydrALAZINE  50 mg Oral TID  . insulin aspart  0-15 Units Subcutaneous TID WC  . insulin glargine  20 Units Subcutaneous  QHS  . metolazone  2.5 mg Oral Daily  . metoprolol succinate  50 mg Oral Daily  . senna-docusate  1 tablet Oral BID    Infusions: . furosemide Stopped (09/05/18 2300)    PRN Medications: acetaminophen **OR** acetaminophen, Melatonin, ondansetron **OR** ondansetron (ZOFRAN) IV, polyethylene glycol, polyvinyl alcohol   Assessment/Plan   1. Acute on chronic diastolic CHF with prominent RV failure: Echo this admission with EF 60-65%, AS appears at least moderate and possibly severe, mildly decreased RV systolic function with D-shaped interventricular septum.He has diuresed some in the hospital.  Volume status is very difficult to assess on exam. RHC today showed ongoing R>L heart failure with mixed pulmonary venous/pulmonary arterial hypertension.  Creatinine up a little today to 2.5. With ongoing oxygen requirement and dyspnea, will continue diuresis this am at least - Continue Lasix 120 mg IV this am at least with a dose of metolazone.  - Follow creatinine closely.  2. Aortic stenosis: Per Dr. Aundra Dubin echo this admission, somewhat difficult study.  There is at least moderate, possibly severe AS.  It is possible that severe AS is making volume management difficult.  - TEE 09/05/2018 - AS appears severe, though only calculates to moderate-severe by continuity equation.  - The aortic valve was severely calcified.  AVA by planimetry was 0.5 cm^2.  Mean gradient 35 mmHg with AVA 1.0 cm^2 by continuity equation.  Normal caliber aorta with mild plaque in the descending thoracic aorta.  - Dr. Burt Knack to see today to see if candidate for TAVR.  3. Atrial fibrillation: Paroxysmal.  He has history of CVA. He has been in NSR this admission. He is not anticoagulated (takes ASA) due to prior intracerebral hemorrhage. 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   - Continue Toprol XL 50 mg daily. Stable.  4. CAD: Nonobstructive. - No s/s of ischemia.    - Continue  ASA and statin.  5. 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  6. CKD: Stage IV - Cr 2.3 - 2.5 this admission . Relatively stable at 2.57 today, though elevated. BUN up to 80.  - Continue to follow with diuresis.  7. OHS/OSA: Patient has OSA, on CPAP at night.  Suspect OHS.  8. Pulmonary hypertension: He has mixed pulmonary venous/pulmonary arterial HTN.  Suspect PAH component is due to OHS/OSA.   Continue diuresis with IV lasix and metolazone at least this am.   Length of Stay: [place], [place]  09/06/2018, 7:49 AM  Advanced Heart Failure Team Pager [phone] (M-F; 7a - 4p)  Please contact [facility] for night-coverage after hours (4p -7a ) and weekends on [email or web address]  Patient seen with PA, agree with the  above note.   Creatinine mildly higher today, he diuresed reasonably yesterday.  He feels more comfortable.  Exam is difficult but no JVD and 1+ chronic ankle edema => suspect he may be about as close to euvolemic as we can get him with his RV failure and renal dysfunction.  Creatinine up mildly to 2.57 today.  - Give Lasix 120 mg IV with metolazone this morning, no Lasix this afternoon.  Likely transition back to torsemide 120 mg bid tomorrow.   I think he has OHS/OSA.  Has CPAP at home, think he will need home oxygen.   He has severe aortic stenosis. Dr. Burt Knack saw today for TAVR evaluation.  Given worsening renal function and baseline tenuous respiratory function, he is a difficult candidate.  Will see how he does over the next few weeks.  If renal function stabilizes, we may be able to do TAVR CTAs and coronary angiogram.  Would not do this admission with elevated BUN/creatinine.   Loralie Champagne 09/06/2018 1:44 PM

## 2018-09-06 NOTE — Progress Notes (Signed)
PROGRESS NOTE    [name]  ZOX:[id number] DOB: May 06, 1946 DOA: 08/31/2018 PCP: [name], MD   Brief Narrative: [name] is a 73 y.o. male with history of diastolic heart failure, morbid obesity, diabetes, CKD stage III, OSA.  Patient presented secondary to shortness of breath and was found to have an acute heart failure exacerbation in addition to acute on chronic respiratory failure requiring increased amount of oxygen.  He has been diuresed with IV Lasix per cardiology.   Assessment & Plan:   Active Problems:   Diabetes mellitus due to underlying condition, controlled, with stage 3 chronic kidney disease, with long-term current use of insulin (HCC)   Hypercholesterolemia   Essential hypertension   COPD (chronic obstructive pulmonary disease) (HCC)   Coronary artery disease involving native coronary artery of native heart without angina pectoris   OSA (obstructive sleep apnea)   PAF (paroxysmal atrial fibrillation) (HCC)   CKD (chronic kidney disease) stage 3, GFR 30-59 ml/min (HCC)   Heart failure (HCC)   Acute on chronic diastolic heart failure Patient with an EF of 60 to 65%.  Cardiology was consulted and is current lady diuresing patient with IV Lasix in addition to metolazone.  2.35 L out in the last 24 hours.  Overall weight appears to be stable. -Cardiology recommendations  Acute on chronic respiratory failure with hypoxia Secondary to acute pulmonary edema in setting of acute heart failure.  Resolved with IV diuresis.  Patient is currently on home oxygen of 2 L via nasal cannula.  Severe aortic stenosis Cardiology evaluated for possible TAVR.  This will need to be followed up as an outpatient.  No plan for procedure prior to discharge.  Paroxysmal atrial fibrillation Continue metoprolol.  History of intracranial bleed and is currently not on full anticoagulation.  Essential hypertension -Continue metoprolol, hydralazine, amlodipine  Acute  kidney injury on CKD stage III In setting of heart failure.  Is improved with diuresis.  Baseline creatinine of 2. Peak of 2.57 in setting of diuresis -Diuresis held this afternoon. -Repeat BMP in AM  Diabetes mellitus, type II -Continue Lantus and sliding scale insulin  Morbid obesity Body mass index is 45.36 kg/m.  Sleep apnea -Continue CPAP at night  History of CVA -Continue aspirin   DVT prophylaxis: Heparin Code Status:   Code Status: Full Code Family Communication: Son at bedside Disposition Plan: Discharge home pending cardiology recommendations   Consultants:   Cardiology  Procedures:   None  Antimicrobials:  None    Subjective: Breathing better. No chest pain.  Objective: Vitals:   09/06/18 1236 09/06/18 1515 09/06/18 1530 09/06/18 1719  BP: 134/86   115/79  Pulse: 60 80 80   Resp: 18     Temp: 98.7 F (37.1 C)     TempSrc: Oral     SpO2: 98% (!) 76% 94%   Weight:      Height:        Intake/Output Summary (Last 24 hours) at 09/06/2018 2044 Last data filed at 09/06/2018 1949 Gross per 24 hour  Intake 1470.18 ml  Output 2500 ml  Net -1029.82 ml   Filed Weights   09/04/18 0532 09/05/18 0304 09/06/18 0646  Weight: (!) 149.6 kg (!) 149.3 kg (!) 147.5 kg    Examination:  General exam: Appears calm and comfortable Respiratory system: Diminished. Respiratory effort normal. Cardiovascular system: S1 & S2 heard, RRR. No murmurs, rubs, gallops or clicks. Gastrointestinal system: Abdomen is nondistended, soft and nontender. No organomegaly or masses felt.  Normal bowel sounds heard. Central nervous system: Alert and oriented. No focal neurological deficits. Extremities: Significant LE edema. No calf tenderness Skin: No cyanosis. Chronic LE venous changes with non-infected wound of right leg Psychiatry: Judgement and insight appear normal. Mood & affect appropriate.     Data Reviewed: I have personally reviewed following labs and imaging  studies  CBC: Recent Labs  Lab 08/31/18 1406 09/05/18 0505 09/06/18 0450  WBC 12.3* 12.1* 11.9*  NEUTROABS 9.2*  --  9.1*  HGB 12.3* 10.6* 10.6*  HCT 43.4 37.0* 36.8*  MCV 82.2 81.5 83.8  PLT 226 204 426   Basic Metabolic Panel: Recent Labs  Lab 09/02/18 0532 09/03/18 0422 09/04/18 0543 09/05/18 0505 09/06/18 0450  NA 141 138 138 139 139  K 3.0* 3.3* 3.8 3.2* 3.2*  CL 96* 96* 95* 96* 96*  CO2 32 31 30 32 31  GLUCOSE 117* 150* 152* 157* 173*  BUN 66* 70* 74* 78* 80*  CREATININE 2.42* 2.52* 2.37* 2.50* 2.57*  CALCIUM 8.7* 8.5* 8.9 8.9 8.9   GFR: Estimated Creatinine Clearance: 39.4 mL/min (A) (by C-G formula based on SCr of 2.57 mg/dL (H)). Liver Function Tests: No results for input(s): AST, ALT, ALKPHOS, BILITOT, PROT, ALBUMIN in the last 168 hours. No results for input(s): LIPASE, AMYLASE in the last 168 hours. No results for input(s): AMMONIA in the last 168 hours. Coagulation Profile: No results for input(s): INR, PROTIME in the last 168 hours. Cardiac Enzymes: No results for input(s): CKTOTAL, CKMB, CKMBINDEX, TROPONINI in the last 168 hours. BNP (last 3 results) No results for input(s): PROBNP in the last 8760 hours. HbA1C: No results for input(s): HGBA1C in the last 72 hours. CBG: Recent Labs  Lab 09/05/18 1603 09/05/18 2106 09/06/18 0723 09/06/18 1233 09/06/18 1625  GLUCAP 165* 200* 146* 181* 183*   Lipid Profile: No results for input(s): CHOL, HDL, LDLCALC, TRIG, CHOLHDL, LDLDIRECT in the last 72 hours. Thyroid Function Tests: No results for input(s): TSH, T4TOTAL, FREET4, T3FREE, THYROIDAB in the last 72 hours. Anemia Panel: No results for input(s): VITAMINB12, FOLATE, FERRITIN, TIBC, IRON, RETICCTPCT in the last 72 hours. Sepsis Labs: No results for input(s): PROCALCITON, LATICACIDVEN in the last 168 hours.  No results found for this or any previous visit (from the past 240 hour(s)).       Radiology Studies: No results  found.      Scheduled Meds: . amLODipine  10 mg Oral Daily  . aspirin EC  325 mg Oral Daily  . atorvastatin  40 mg Oral Daily  . cholecalciferol  1,000 Units Oral Daily  . famotidine  20 mg Oral Daily  . febuxostat  80 mg Oral Daily  . gabapentin  300 mg Oral BID  . heparin  5,000 Units Subcutaneous Q8H  . hydrALAZINE  50 mg Oral TID  . insulin aspart  0-15 Units Subcutaneous TID WC  . insulin glargine  20 Units Subcutaneous QHS  . metolazone  2.5 mg Oral Daily  . metoprolol succinate  50 mg Oral Daily  . potassium chloride  40 mEq Oral BID  . senna-docusate  1 tablet Oral BID   Continuous Infusions:   LOS: 6 days     Cordelia Poche, MD [facility] 09/06/2018, 8:44 PM  If 7PM-7AM, please contact night-coverage [email or web address]

## 2018-09-06 NOTE — Consult Note (Signed)
Cardiology Consultation:   Patient ID: Francesco Provencal MRN: [id number]; DOB: 04-15-1946  Admit date: 08/31/2018 Date of Consult: 09/06/2018  Primary Care Provider: [name], MD Primary Cardiologist: Dr Aundra Dubin Primary Electrophysiologist:  None    Patient Profile:   Eulalio Reamy is a 73 y.o. male with a hx of multiple medical problems who is being seen today for the evaluation of severe aortic stenosis at the request of Dr Aundra Dubin.  History of Present Illness:   Mr. Avey is a 73 yo male currently hospitalized with acute on chronic diastolic heart failure. The patient has a complex medical history with poor functional status, blindness, morbid obesity, chronic kidney disease, diabetes, and chronic respiratory failure. He has had chronic CHF felt to be related to RV dysfunction and right heart failure. He's had paroxysmal atrial fibrillation with treatment complicated by amiodarone lung toxicity. He is not anticoagulated because of history of intracranial bleed. The patient has been managed with high dose torsemide and metolazone as an outpatient, but recently presented with worsening volume overload requiring inpatient admission for IV diuresis.   He has been followed for several years with aortic stenosis, previously in the moderate range on his most recent echo from August 2019.  During this admission he underwent a transesophageal echocardiogram consistent with severe aortic stenosis, with a severely calcified and restricted aortic valve and valve area by planimetry of 0.5 cm.  His mean transvalvular gradient was 35 mmHg with a calculated valve area of 1 cm.  The patient lives alone with a lot of support to help care for him.  He has been blind for about 6 years by his report.  He has a good bit of trouble just getting around because of his blindness, morbid obesity, heart failure, and right forefoot amputation.  At the time of my evaluation the patient is resting comfortably in  bed.  He is on BiPAP, making communication somewhat difficult.  Past Medical History:  Diagnosis Date  . Arthritis    "all over" (06/23/2013)  . Atrial fibrillation ([place])    not Candidate for Coumadin d/t rectal bleeding  . Benign prostatic hypertrophy   . Cardiomyopathy, hypertensive ([place])     diastolic dysfunction by 7564 echo  . Coronary artery disease     nonobstructive. 2 vessel. cardiac catheter in March 2005 showing 60% LAD occlusion, 30% circumflex occlusion.  . Degenerative joint disease     Right knee.  . Diabetes type 2, uncontrolled ([place])   . Diabetic peripheral neuropathy ([place])   . Diabetic ulcer of thigh ([place])    H/O Rt thigh ulcer.  . Exertional shortness of breath    "comes and goes" (06/23/2013)  . GERD (gastroesophageal reflux disease)   . Gout   . Herpes   . Hyperlipidemia   . Hypertension   . Hypotension 09/05/2013  . Incarcerated ventral hernia    H/O. S/P repair ventral hernia repair 07/2005.  Marland Kitchen LBBB (left bundle branch block)   . Legally blind   . Morbid obesity ([place])   . Obstructive sleep apnea    On CPAP. Last sleep study (06/2009) - Severe obstructive sleep apnea/hypopnea syndrome, apnea-hypopnea index 70.5 per hour with non positional events moderately loud snoring and oxygen desaturation to a nadir of 85% on room air.  . Occult blood positive stool     History of in August 2007.  colonoscopy in January 2008 showing normal colon with fair inadequate prep. By Dr. Silvano Rusk.  . 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 Sepsis ([place]) 08/26/2013    Past Surgical History:  Procedure Laterality Date  . AMPUTATION Left 06/22/2013   Procedure: AMPUTATION FOOT;  Surgeon: Newt Minion, MD;  Location: [facility];  Service: Orthopedics;  Laterality: Left;  Left Midfoot Amputation  . CARDIAC CATHETERIZATION  10/22/2003   Dilatation of the LV with preserved LV systolic function. Tortous aorta, but no AAA. Moderate disease with 50-60% area of narrowing in the  circumflex after the second OM and 70% mid narrowing in the LAD. No intervention.  Marland Kitchen CARDIOVASCULAR STRESS TEST  01/23/2010   Perfusion defect seen in inferior myocardial region is consistent with diaphragmatic attenuation. Remaining myocardium demonstrates normal myocardial perfusion with no evidence of ischemia or infarct. Stress ECG was non-diagnostic due to LBBB.  Marland Kitchen CATARACT EXTRACTION, BILATERAL Bilateral   . ESOPHAGOGASTRODUODENOSCOPY N/A 10/12/2013   Procedure: ESOPHAGOGASTRODUODENOSCOPY (EGD);  Surgeon: Gwenyth Ober, MD;  Location: [facility];  Service: General;  Laterality: N/A;  bedside  . EYE SURGERY    . 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 HERNIA REPAIR    . INSERT / REPLACE / REMOVE PACEMAKER  11/30/2003   Secondary to symptomatic bradycardia. DDDR pacemaker. By Dr. Rollene Fare.  Randolm Idol / REPLACE / REMOVE PACEMAKER  November 29, 2012   "old pacemaker died; had new one put in" (07-18-13)  . Pacemaker lead revision  12/2003    likely microdislodgment  of right ventricular lead  . PEG PLACEMENT N/A 10/12/2013   Procedure: PERCUTANEOUS ENDOSCOPIC GASTROSTOMY (PEG) PLACEMENT;  Surgeon: Gwenyth Ober, MD;  Location: [facility];  Service: General;  Laterality: N/A;  . PERMANENT PACEMAKER GENERATOR CHANGE N/A 11/03/2012   Procedure: PERMANENT PACEMAKER GENERATOR CHANGE;  Surgeon: Sanda Klein, MD;  Location: [facility] CATH LAB;  Service: Cardiovascular;  Laterality: N/A;  . REFRACTIVE SURGERY Bilateral   . RIGHT HEART CATH N/A 09/05/2018   Procedure: RIGHT HEART CATH;  Surgeon: Larey Dresser, MD;  Location: [facility] CV LAB;  Service: Cardiovascular;  Laterality: N/A;  . TRANSTHORACIC ECHOCARDIOGRAM  01/01/2013   EF 50-55%, ventricular septum-thickness moderately increased  . VENOUS DOPPLER  01/17/2013   No evidence of thrombus or thrombophlebitis. Left GSVs appear enlarged and demonstrate valvular insuffiiciency. Bilateral SSVs appeared patent with no  valvular insufficiency seen.  Marland Kitchen VENTRAL HERNIA REPAIR  07/2005    S/P laparotomy, lysis of adhesions, repair of incarcerated ventral hernia with  partial omentectomy.. Performed by Dr. Marlou Starks.     Home Medications:  Prior to Admission medications   Medication Sig Start Date End Date Taking? Authorizing Provider  acetaminophen (TYLENOL) 650 MG CR tablet Take 1,300 mg by mouth 2 (two) times daily.    Yes [provider]  albuterol (PROVENTIL HFA;VENTOLIN HFA) 108 (90 Base) MCG/ACT inhaler Inhale 2 puffs into the lungs every 6 (six) hours as needed for wheezing or shortness of breath.   Yes [provider]  amLODipine (NORVASC) 10 MG tablet Take 10 mg by mouth daily. Reported on 12/09/2015   Yes [provider]  antiseptic oral rinse (BIOTENE) LIQD 15 mLs by Mouth Rinse route 3 (three) times daily.   Yes [provider]  aspirin 81 MG tablet Take 81 mg by mouth daily.    Yes [provider]  atorvastatin (LIPITOR) 40 MG tablet Take 1 tablet (40 mg total) by mouth daily. 04/16/15  Yes Croitoru, Mihai, MD  cholecalciferol (VITAMIN D) 1000 units tablet Take 1,000 Units by mouth daily.  Yes [provider]  citalopram (CELEXA) 20 MG tablet Take 20 mg by mouth daily.   Yes [provider]  famotidine (PEPCID) 20 MG tablet Take 20 mg by mouth daily.   Yes [provider]  Febuxostat (ULORIC) 80 MG TABS Take 80 mg by mouth daily.    Yes [provider]  gabapentin (NEURONTIN) 300 MG capsule Take 300 mg by mouth 2 (two) times daily.   Yes [provider]  hydrALAZINE (APRESOLINE) 50 MG tablet Take 1 tablet (50 mg total) 3 (three) times daily by mouth. 06/24/17  Yes Helberg, Larkin Ina, MD  Insulin Glargine (TOUJEO SOLOSTAR) 300 UNIT/ML SOPN Inject 60 Units into the skin 2 (two) times daily.    Yes [provider]  lidocaine (LIDODERM) 5 % Place 1 patch onto the skin daily as needed (back pain). Remove & Discard patch  within 12 hours or as directed by MD   Yes [provider]  Liraglutide (VICTOZA) 18 MG/3ML SOPN Inject 1.8 mg into the skin daily.   Yes [provider]  Melatonin 3 MG CAPS Take 1 capsule by mouth at bedtime as needed (sleep).    Yes [provider]  Menthol, Topical Analgesic, (BIOFREEZE EX) Apply 1 application topically 3 (three) times daily as needed (pain). Reported on 10/27/2015   Yes [provider]  methylcellulose (ARTIFICIAL TEARS) 1 % ophthalmic solution Place 1 drop into both eyes 2 (two) times daily as needed (dry eyes).    Yes [provider]  metolazone (ZAROXOLYN) 2.5 MG tablet Take 2.5 mg by mouth daily.    Yes [provider]  metoprolol succinate (TOPROL-XL) 50 MG 24 hr tablet Take 1 tablet (50 mg total) by mouth daily. Take with or immediately following a meal. 03/21/18  Yes Larey Dresser, MD  polyethylene glycol [facility] / Floria Raveling) packet Take 17 g by mouth daily as needed for moderate constipation.   Yes [provider]  potassium chloride SA (K-DUR,KLOR-CON) 20 MEQ tablet Take 40-60 mEq by mouth See admin instructions. 60 MEQ in the morning, 40 meq mid-day, and 60 meq in the evening   Yes [provider]  sennosides-docusate sodium (SENOKOT-S) 8.6-50 MG tablet Take 2 tablets by mouth 2 (two) times daily.    Yes [provider]  Skin Protectants, Misc. (EUCERIN) cream Apply 1 application topically 2 (two) times daily.   Yes [provider]  torsemide (DEMADEX) 20 MG tablet Take 6 tablets (120 mg total) by mouth 2 (two) times daily. 03/23/18  Yes Larey Dresser, MD    Inpatient Medications: Scheduled Meds: . amLODipine  10 mg Oral Daily  . aspirin EC  325 mg Oral Daily  . atorvastatin  40 mg Oral Daily  . cholecalciferol  1,000 Units Oral Daily  . famotidine  20 mg Oral Daily  . febuxostat  80 mg Oral Daily  . gabapentin  300 mg Oral BID  . heparin  5,000 Units Subcutaneous Q8H  .  hydrALAZINE  50 mg Oral TID  . insulin aspart  0-15 Units Subcutaneous TID WC  . insulin glargine  20 Units Subcutaneous QHS  . metolazone  2.5 mg Oral Daily  . metoprolol succinate  50 mg Oral Daily  . senna-docusate  1 tablet Oral BID   Continuous Infusions: . furosemide Stopped (09/05/18 2300)   PRN Meds: acetaminophen **OR** acetaminophen, Melatonin, ondansetron **OR** ondansetron (ZOFRAN) IV, polyethylene glycol, polyvinyl alcohol  Allergies:   No Known Allergies  Social History:  Social History   Socioeconomic History  . Marital status: Divorced    Spouse name: Not on file  . Number of children: 4  . Years of education: 11  . Highest education level: 12th grade  Occupational History  . Occupation: disabled  Social Needs  . Financial resource strain: Somewhat hard  . Food insecurity:    Worry: Sometimes true    Inability: Sometimes true  . Transportation needs:    Medical: No    Non-medical: Yes  Tobacco Use  . Smoking status: Former Smoker    Packs/day: 0.00    Years: 0.00    Pack years: 0.00    Types: Cigarettes    Last attempt to quit: 08/17/2003    Years since quitting: 15.0  . Smokeless tobacco: Never Used  . Tobacco comment: started at age 51.  only smoked on occ- very rarely.0  Substance and Sexual Activity  . Alcohol use: Yes    Comment: 06/23/2013 "drank some; never had a problem w/it"  . Drug use: No  . Sexual activity: Never  Lifestyle  . Physical activity:    Days per week: Not on file    Minutes per session: Not on file  . Stress: Not on file  Relationships  . Social connections:    Talks on phone: Not on file    Gets together: Not on file    Attends religious service: Not on file    Active member of club or organization: Not on file    Attends meetings of clubs or organizations: Not on file    Relationship status: Not on file  . Intimate partner violence:    Fear of current or ex partner: Not on file    Emotionally abused: Not on file      Physically abused: Not on file    Forced sexual activity: Not on file  Other Topics Concern  . Not on file  Social History Narrative   12/10- reports issues with housing- states it is not disability friendly but has ramp, first floor apt, and has grab bars/assistive devices provided by [employer].  Reports that he has concerns with having enough food but gets meals on wheels, eats at [employer] 3x a week, and has personal aid who cooks and shops for him.  Reports concerns with transportation but gets [facility] transport for all of his medical appts and his family or an aid helps him with other transportation needs.    Family History:    Family History  Problem Relation Age of Onset  . Coronary artery disease Sister   . Heart disease Sister   . Allergies Sister   . Asthma Brother   . Rheum arthritis Brother      ROS:  Please see the history of present illness.  All other ROS reviewed and negative.     Physical Exam/Data:   Vitals:   09/05/18 1926 09/05/18 2015 09/05/18 2210 09/06/18 0355  BP: 133/84  (!) 138/92 (!) 123/54  Pulse: 72 74  72  Resp: (!) 8 18  18   Temp: 98.4 F (36.9 C)   (!) 97.2 F (36.2 C)  TempSrc: Oral     SpO2: 96% 98%  97%  Weight:      Height:        Intake/Output Summary (Last 24 hours) at 09/06/2018 0631 Last data filed at 09/06/2018 0313 Gross per 24 hour  Intake 801.35 ml  Output 1450 ml  Net -648.65 ml   Last 3 Weights 09/05/2018  09/04/2018 09/03/2018  Weight (lbs) 329 lb 3.2 oz 329 lb 11.2 oz 327 lb 9.6 oz  Weight (kg) 149.324 kg 149.551 kg 148.598 kg     Body mass index is 45.91 kg/m.  General: Morbidly obese male, on BiPAP, in no distress HEENT: normal Lymph: no adenopathy Neck: Unable to visualize venous pressure because of body habitus Endocrine:  No thryomegaly Vascular: No carotid bruits;   Cardiac: Regular rate and rhythm with grade 3/6 harsh late peaking systolic murmur with diminished A2 Lungs:  clear to auscultation bilaterally, no wheezing,  rhonchi or rales  Abd: soft, nontender, no hepatomegaly, obese Ext: 2+ pretibial edema bilaterally Musculoskeletal: Right midfoot amputation, BUE and BLE strength normal and equal Skin: Marked bilateral stasis dermatitis Neuro:  CNs 2-12 intact, no focal abnormalities noted Psych:  Normal affect   Telemetry:  Telemetry was personally reviewed and demonstrates: Demand ventricular pacing  Relevant CV Studies: 2D Echo 09/01/2018: Left ventricle:  The cavity size was normal. Wall thickness was increased in a pattern of moderate LVH. Systolic function was normal. The estimated ejection fraction was in the range of 60% to 65%. Wall motion was normal; there were no regional wall motion abnormalities. The study was not technically sufficient to allow evaluation of LV diastolic dysfunction due to atrial fibrillation.   ------------------------------------------------------------------- Aortic valve:   Severely thickened, severely calcified leaflets. Doppler:   There was severe stenosis.      VTI ratio of LVOT to aortic valve: 0.29. Valve area (VTI): 1.32 cm^2. Indexed valve area (VTI): 0.47 cm^2/m^2. Peak velocity ratio of LVOT to aortic valve: 0.31. Valve area (Vmax): 1.38 cm^2. Indexed valve area (Vmax): 0.49 cm^2/m^2. Mean velocity ratio of LVOT to aortic valve: 0.27. Valve area (Vmean): 1.23 cm^2. Indexed valve area (Vmean): 0.44 cm^2/m^2.    Mean gradient (S): 30 mm Hg. Peak gradient (S): 46 mm Hg.  ------------------------------------------------------------------- Aorta:  Aortic root: The aortic root was normal in size. Ascending aorta: The ascending aorta was normal in size.  ------------------------------------------------------------------- Mitral valve:   Structurally normal valve.   Leaflet separation was normal.  Doppler:  Transvalvular velocity was within the normal range. There was no evidence for stenosis. There was  no regurgitation.  ------------------------------------------------------------------- Left atrium:  The atrium was mildly dilated.  ------------------------------------------------------------------- Right ventricle:  The cavity size was normal. Systolic function was normal.  ------------------------------------------------------------------- Pulmonic valve:    The valve appears to be grossly normal. Doppler:  There was no significant regurgitation.  ------------------------------------------------------------------- Tricuspid valve:   The valve appears to be grossly normal. Doppler:  There was no significant regurgitation.  ------------------------------------------------------------------- Right atrium:  The atrium was moderately dilated.  ------------------------------------------------------------------- Pericardium:  There was no pericardial effusion.  ------------------------------------------------------------------- Systemic veins: Inferior vena cava: The vessel was dilated. The respirophasic diameter changes were in the normal range (>= 50%), consistent with elevated central venous pressure.  ------------------------------------------------------------------- Measurements   Left ventricle                           Value          Reference  LV ID, ED, PLAX chordal                  48    mm       43 - 52  LV ID, ES, PLAX chordal                  34    mm  23 - 38  LV fx shortening, PLAX chordal           29    %        >=29  LV PW thickness, ED                      16    mm       ----------  IVS/LV PW ratio, ED                      0.88           <=1.3  Stroke volume, 2D                        99    ml       ----------  Stroke volume/bsa, 2D                    35    ml/m^2   ----------    Ventricular septum                       Value          Reference  IVS thickness, ED                        14    mm       ----------    LVOT                                      Value          Reference  LVOT ID, S                               24    mm       ----------  LVOT area                                4.52  cm^2     ----------  LVOT peak velocity, S                    104   cm/s     ----------  LVOT mean velocity, S                    70    cm/s     ----------  LVOT VTI, S                              22    cm       ----------    Aortic valve                             Value          Reference  Aortic valve peak velocity, S            340   cm/s     ----------  Aortic valve mean velocity, S            257   cm/s     ----------  Aortic valve VTI, S                      75.1  cm       ----------  Aortic mean gradient, S                  30    mm Hg    ----------  Aortic peak gradient, S                  46    mm Hg    ----------  VTI ratio, LVOT/AV                       0.29           ----------  Aortic valve area, VTI                   1.32  cm^2     ----------  Aortic valve area/bsa, VTI               0.47  cm^2/m^2 ----------  Velocity ratio, peak, LVOT/AV            0.31           ----------  Aortic valve area, peak velocity         1.38  cm^2     ----------  Aortic valve area/bsa, peak              0.49  cm^2/m^2 ----------  velocity  Velocity ratio, mean, LVOT/AV            0.27           ----------  Aortic valve area, mean velocity         1.23  cm^2     ----------  Aortic valve area/bsa, mean              0.44  cm^2/m^2 ----------  velocity    Aorta                                    Value          Reference  Aortic root ID, ED                       33    mm       ----------    Left atrium                              Value          Reference  LA ID, A-P, ES                           50    mm       ----------  LA ID/bsa, A-P                           1.79  cm/m^2   <=2.2  LA volume, S                             103   ml       ----------  LA volume/bsa, S  36.9  ml/m^2   ----------  LA volume, ES, 1-p A4C                    81.6  ml       ----------  LA volume/bsa, ES, 1-p A4C               29.2  ml/m^2   ----------  LA volume, ES, 1-p A2C                   121   ml       ----------  LA volume/bsa, ES, 1-p A2C               43.3  ml/m^2   ----------    Right atrium                             Value          Reference  RA ID, S-I, ES, A4C              (H)     67.1  mm       34 - 49  RA area, ES, A4C                 (H)     26.3  cm^2     8.3 - 19.5  RA volume, ES, A/L                       85.7  ml       ----------  RA volume/bsa, ES, A/L                   30.7  ml/m^2   ----------    Systemic veins                           Value          Reference  Estimated CVP                            8     mm Hg    ----------    Right ventricle                          Value          Reference  TAPSE                                    22.5  mm       ----------  RV s&', lateral, S                        12.3  cm/s     ----------  TEE 09/05/2018: Findings: Please see echo section for full report. Images were limited as oxygen saturation was dropping so we did a rapid study.  Normal LV size with moderate LV hypertrophy, EF 55-60%. Normal wall motion.  Normal RV size and systolic function.  Mild left atrial enlargement, no LA appendage thrombus.  Normal right atrium.  Trivial TR.  Trivial MR.  The aortic valve was severely calcified.  AVA by planimetry was 0.5 cm^2.  Mean gradient 35 mmHg  with AVA 1.0 cm^2 by continuity equation.  Normal caliber aorta with mild plaque in the descending thoracic aorta.   Impression: I think that aortic stenosis is severe, though only calculates to moderate-severe by continuity equation (visually and by planimetry think severe).   Loralie Champagne 09/05/2018 12:54 PM  Laboratory Data:  Chemistry Recent Labs  Lab 09/03/18 0422 09/04/18 0543 09/05/18 0505  NA 138 138 139  K 3.3* 3.8 3.2*  CL 96* 95* 96*  CO2 31 30 32  GLUCOSE 150* 152* 157*  BUN 70* 74* 78*   CREATININE 2.52* 2.37* 2.50*  CALCIUM 8.5* 8.9 8.9  GFRNONAA 25* 27* 25*  GFRAA 29* 31* 29*  ANIONGAP 11 13 11     No results for input(s): PROT, ALBUMIN, AST, ALT, ALKPHOS, BILITOT in the last 168 hours. Hematology Recent Labs  Lab 08/31/18 1406 09/05/18 0505 09/06/18 0450  WBC 12.3* 12.1* 11.9*  RBC 5.28 4.54 4.39  HGB 12.3* 10.6* 10.6*  HCT 43.4 37.0* 36.8*  MCV 82.2 81.5 83.8  MCH 23.3* 23.3* 24.1*  MCHC 28.3* 28.6* 28.8*  RDW 17.7* 17.1* 17.1*  PLT 226 204 193   Cardiac EnzymesNo results for input(s): TROPONINI in the last 168 hours.  Recent Labs  Lab 08/31/18 1423  TROPIPOC 0.01    BNP Recent Labs  Lab 08/31/18 1406  BNP 151.0*    DDimer No results for input(s): DDIMER in the last 168 hours.  Radiology/Studies:  No results found.  STS Risk Calculator (Isolated AVR) Risk of Mortality: 12.494% Renal Failure: 48.130% Permanent Stroke: 1.295% Prolonged Ventilation: 45.609% DSW Infection: 0.975% Reoperation: 4.691% Morbidity or Mortality: 60.104% Short Length of Stay: 4.506% Long Length of Stay:  Assessment and Plan:   1. Severe, stage D1 aortic stenosis 2. Acute on chronic diastolic heart failure with prominent RV failure 3. Morbid obesity with chronic respiratory failure 4. Severe pulmonary hypertension, likely multifactorial with obesity hypoventilation syndrome as well as left heart failure 5. Paroxysmal atrial fibrillation, not a candidate for anticoagulation 6. Coronary artery disease, remote catheterization reviewed 7. Chronic kidney disease stage IV 8. Macular degeneration with blindness 9. Type 2 diabetes  All available data is reviewed.  Unfortunately this patient is markedly debilitated by a multitude of medical problems.  I have reviewed the natural history of aortic stenosis with the patient today. We have discussed the limitations of medical therapy and the poor prognosis associated with symptomatic aortic stenosis. We have  reviewed potential treatment options, including palliative medical therapy, conventional surgical aortic valve replacement, and transcatheter aortic valve replacement. We discussed treatment options in the context of the patient's specific comorbid medical conditions. The patient would obviously not be a candidate for conventional heart surgery under any circumstances. His treatment options are limited to TAVR or palliative medical therapy.   I suspect his severe aortic stenosis is in fact playing a role in his heart failure.  However, he has a significant component of right heart failure and pulmonary hypertension.  At this point I do not feel confident that we can get him through TAVR evaluation and treatment without significant complications based on his marginal respiratory status, morbid obesity, right heart failure, and advanced kidney disease.  If he becomes better compensated with respect to his heart failure and there is some improvement in his kidney function, it would be reasonable to consider him as a candidate down the road.  I have discussed his case with Dr. Aundra Dubin.   I will follow-up with the patient tomorrow to review final  recommendations.   For questions or updates, please contact [facility] Please consult [email or web address] for contact info under   Signed, [name], MD  09/06/2018 6:31 AM

## 2018-09-06 NOTE — Progress Notes (Signed)
Physical Therapy Treatment Patient Details Name: [name] MRN: [id number] DOB: Dec 03, 1945 Today's Date: 09/06/2018    History of Present Illness Pt is a 73 y.o. male admitted with SOB. Worked up for CHF exacerbation. PMH includes legally blind, transmet amputation, CKD, obesity, DM, HTN, CAD, neuropathy.    PT Comments    Pt received seated EOB, O2 doffed, SpO2 88% on Room air, HR: 64bpm. SpO2 drops to 76% on room air AMB, but moved to 4L/min, able to perform same distance again, this time with terminal SpO2: 94%. Worked on STS transfers for technique and leg strength. Pt agreeable to sit up in chair at end of session. All need met. Visitor in room at exit. Pt continues to endorse weakness compared to baseline. Wet cough noted. Pt appears mildly somnolent, but he denies this.     Follow Up Recommendations  Supervision for mobility/OOB(Pt is a [facility] participant, able to receive [facility] at [facility] as needed. )     Equipment Recommendations  None recommended by PT    Recommendations for Other Services       Precautions / Restrictions Precautions Precautions: Fall Precaution Comments: Legally blind Restrictions Weight Bearing Restrictions: No    Mobility  Bed Mobility               General bed mobility comments: Received sitting EOB. SpO2 88%on room air   Transfers Overall transfer level: Needs assistance Equipment used: Rolling walker (2 wheeled) Transfers: Sit to/from Stand Sit to Stand: Supervision         General transfer comment: Reliant on momentum to power into standing; heavy reliance on UE support. Able to perform 10x from elevated surface with safe technique   Ambulation/Gait Ambulation/Gait assistance: Supervision(heavy verbal cuing required for navigation around obstacles) Gait Distance (Feet): 35 Feet(39f,  2x) Assistive device: Rolling walker (2 wheeled) Gait Pattern/deviations: Step-through pattern;Decreased stride length;Wide base of  support;Trunk flexed Gait velocity: <0.162m    General Gait Details: On RA SpO2 drops to 76%, then on 4L/min 94%.    Stairs             Wheelchair Mobility    Modified Rankin (Stroke Patients Only)       Balance Overall balance assessment: Mild deficits observed, not formally tested;Needs assistance Sitting-balance support: Feet supported;No upper extremity supported Sitting balance-Leahy Scale: Good       Standing balance-Leahy Scale: Fair                              Cognition Arousal/Alertness: Lethargic(sleeping upon entry, appears somnolent when sitting for prolonged periods) Behavior During Therapy: WFL for tasks assessed/performed Overall Cognitive Status: Within Functional Limits for tasks assessed                                        Exercises Other Exercises Other Exercises: STS from chair x10 on 4L/min O2     General Comments        Pertinent Vitals/Pain Pain Assessment: No/denies pain    Home Living                      Prior Function            PT Goals (current goals can now be found in the care plan section) Acute Rehab PT Goals Patient Stated Goal: to return home when  ready PT Goal Formulation: With patient Time For Goal Achievement: 09/15/18 Potential to Achieve Goals: Good Progress towards PT goals: Progressing toward goals    Frequency    Min 3X/week      PT Plan Current plan remains appropriate    Co-evaluation              AM-PAC PT "6 Clicks" Mobility   Outcome Measure  Help needed turning from your back to your side while in a flat bed without using bedrails?: A Lot Help needed moving from lying on your back to sitting on the side of a flat bed without using bedrails?: A Little Help needed moving to and from a bed to a chair (including a wheelchair)?: A Little Help needed standing up from a chair using your arms (e.g., wheelchair or bedside chair)?: A Little Help  needed to walk in hospital room?: A Little Help needed climbing 3-5 steps with a railing? : A Lot 6 Click Score: 16    End of Session Equipment Utilized During Treatment: Oxygen Activity Tolerance: Patient limited by fatigue;Patient limited by lethargy;No increased pain Patient left: in chair;with call bell/phone within reach;with family/visitor present   PT Visit Diagnosis: Other abnormalities of gait and mobility (R26.89);Muscle weakness (generalized) (M62.81)     Time: 7471-8550 PT Time Calculation (min) (ACUTE ONLY): 26 min  Charges:  $Therapeutic Activity: 23-37 mins                     3:54 PM, 09/06/18 Etta Grandchild, PT, DPT Physical Therapist - [place] [phone] (Pager)  [phone] (Office)      [name] 09/06/2018, 3:51 PM

## 2018-09-07 DIAGNOSIS — I509 Heart failure, unspecified: Secondary | ICD-10-CM

## 2018-09-07 LAB — CBC WITH DIFFERENTIAL/PLATELET
ABS IMMATURE GRANULOCYTES: 0.07 10*3/uL (ref 0.00–0.07)
Basophils Absolute: 0 10*3/uL (ref 0.0–0.1)
Basophils Relative: 0 %
Eosinophils Absolute: 0.2 10*3/uL (ref 0.0–0.5)
Eosinophils Relative: 2 %
HCT: 37.4 % — ABNORMAL LOW (ref 39.0–52.0)
Hemoglobin: 11 g/dL — ABNORMAL LOW (ref 13.0–17.0)
Immature Granulocytes: 1 %
LYMPHS ABS: 1.6 10*3/uL (ref 0.7–4.0)
Lymphocytes Relative: 11 %
MCH: 23.9 pg — ABNORMAL LOW (ref 26.0–34.0)
MCHC: 29.4 g/dL — ABNORMAL LOW (ref 30.0–36.0)
MCV: 81.3 fL (ref 80.0–100.0)
Monocytes Absolute: 1.2 10*3/uL — ABNORMAL HIGH (ref 0.1–1.0)
Monocytes Relative: 9 %
Neutro Abs: 11 10*3/uL — ABNORMAL HIGH (ref 1.7–7.7)
Neutrophils Relative %: 77 %
Platelets: 209 10*3/uL (ref 150–400)
RBC: 4.6 MIL/uL (ref 4.22–5.81)
RDW: 17 % — ABNORMAL HIGH (ref 11.5–15.5)
WBC: 14.2 10*3/uL — ABNORMAL HIGH (ref 4.0–10.5)
nRBC: 0 % (ref 0.0–0.2)

## 2018-09-07 LAB — GLUCOSE, CAPILLARY
GLUCOSE-CAPILLARY: 186 mg/dL — AB (ref 70–99)
Glucose-Capillary: 192 mg/dL — ABNORMAL HIGH (ref 70–99)
Glucose-Capillary: 143 mg/dL — ABNORMAL HIGH (ref 70–99)
Glucose-Capillary: 210 mg/dL — ABNORMAL HIGH (ref 70–99)
Glucose-Capillary: 210 mg/dL — ABNORMAL HIGH (ref 70–99)

## 2018-09-07 LAB — BASIC METABOLIC PANEL
Anion gap: 12 (ref 5–15)
BUN: 83 mg/dL — ABNORMAL HIGH (ref 8–23)
CALCIUM: 9 mg/dL (ref 8.9–10.3)
CO2: 30 mmol/L (ref 22–32)
Chloride: 96 mmol/L — ABNORMAL LOW (ref 98–111)
Creatinine, Ser: 2.4 mg/dL — ABNORMAL HIGH (ref 0.61–1.24)
GFR calc Af Amer: 31 mL/min — ABNORMAL LOW (ref >60)
GFR calc non Af Amer: 26 mL/min — ABNORMAL LOW (ref >60)
Glucose, Bld: 143 mg/dL — ABNORMAL HIGH (ref 70–99)
Potassium: 3.6 mmol/L (ref 3.5–5.1)
Sodium: 138 mmol/L (ref 135–145)

## 2018-09-07 MED ORDER — TORSEMIDE 20 MG PO TABS
120.0000 mg | ORAL_TABLET | Freq: Two times a day (BID) | ORAL | Status: DC
  Administered 2018-09-07 – 2018-09-08 (×3): 120 mg via ORAL
  Filled 2018-09-07 (×3): qty 1

## 2018-09-07 NOTE — Progress Notes (Addendum)
Advanced Heart Failure Rounding Note  PCP-Cardiologist: No primary care provider on file.   Subjective:    Cr 2.5 -> 2.57 -> 2.40. BUN 83. K 3.6. Weight down another 8 lbs with 120 mg IV lasix x1 + metolazone. Only net negative 180 mls.   WBC up to 14. Afebrile.  Daughter is concerned about him being at home alone. She has requested primary MD call her (see note from Darrick Grinder, NP). SOB is at baseline, has some with exertion. No dizziness.   TEE 09/05/2018 - LVEF 55-60% - AS appears severe, though only calculates to moderate-severe by continuity equation.  - The aortic valve was severely calcified.  AVA by planimetry was 0.5 cm^2.  Mean gradient 35 mmHg with AVA 1.0 cm^2 by continuity equation.  Normal caliber aorta with mild plaque in the descending thoracic aorta.   RHC Procedural Findings (1/21): Hemodynamics (mmHg) RA mean 15 RV 71/18 PA 68/23, mean 44 PCWP mean 20 Oxygen saturations: PA 64% AO 94% Cardiac Output (Fick) 8.01  Cardiac Index (Fick) 3.08 PVR 3.0 WU  Objective:   Weight Range: (!) 139.7 kg Body mass index is 42.96 kg/m.   Vital Signs:   Temp:  [98 F (36.7 C)-98.7 F (37.1 C)] 98 F (36.7 C) (01/23 0416) Pulse Rate:  [60-80] 74 (01/23 0416) Resp:  [18-20] 20 (01/23 0416) BP: (107-137)/(54-87) 107/54 (01/23 0416) SpO2:  [76 %-100 %] 100 % (01/23 0416) Weight:  [139.7 kg] 139.7 kg (01/23 0416) Last BM Date: 09/03/18  Weight change: Filed Weights   09/05/18 0304 09/06/18 0646 09/07/18 0416  Weight: (!) 149.3 kg (!) 147.5 kg (!) 139.7 kg    Intake/Output:   Intake/Output Summary (Last 24 hours) at 09/07/2018 0736 Last data filed at 09/07/2018 0600 Gross per 24 hour  Intake 1488.83 ml  Output 1670 ml  Net -181.17 ml      Physical Exam    General:  No resp difficulty. HEENT: Normal Neck: Thick. JVP difficult to assess. Carotids 2+ bilat; no bruits. No thyromegaly or nodule noted. Cor: PMI nondisplaced. RRR, 3/6 AS Lungs: CTAB,  normal effort. Abdomen: Soft, non-tender, non-distended, no HSM. No bruits or masses. +BS  Extremities: No cyanosis, clubbing, or rash. R and LLE chronic 1+ edema  Neuro: Alert & orientedx3, cranial nerves grossly intact. moves all 4 extremities w/o difficulty. Affect pleasant  Telemetry   NSR 70s with rare Vpacing. Personally reviewed.   Labs    CBC Recent Labs    09/06/18 0450 09/07/18 0522  WBC 11.9* 14.2*  NEUTROABS 9.1* 11.0*  HGB 10.6* 11.0*  HCT 36.8* 37.4*  MCV 83.8 81.3  PLT 193 811   Basic Metabolic Panel Recent Labs    09/06/18 0450 09/07/18 0522  NA 139 138  K 3.2* 3.6  CL 96* 96*  CO2 31 30  GLUCOSE 173* 143*  BUN 80* 83*  CREATININE 2.57* 2.40*  CALCIUM 8.9 9.0   Liver Function Tests No results for input(s): AST, ALT, ALKPHOS, BILITOT, PROT, ALBUMIN in the last 72 hours. No results for input(s): LIPASE, AMYLASE in the last 72 hours. Cardiac Enzymes No results for input(s): CKTOTAL, CKMB, CKMBINDEX, TROPONINI in the last 72 hours.  BNP: BNP (last 3 results) Recent Labs    08/31/18 1406  BNP 151.0*    ProBNP (last 3 results) No results for input(s): PROBNP in the last 8760 hours.   D-Dimer No results for input(s): DDIMER in the last 72 hours. Hemoglobin A1C No results for input(s):  HGBA1C in the last 72 hours. Fasting Lipid Panel No results for input(s): CHOL, HDL, LDLCALC, TRIG, CHOLHDL, LDLDIRECT in the last 72 hours. Thyroid Function Tests No results for input(s): TSH, T4TOTAL, T3FREE, THYROIDAB in the last 72 hours.  Invalid input(s): FREET3  Other results:   Imaging    No results found.   Medications:     Scheduled Medications: . amLODipine  10 mg Oral Daily  . aspirin EC  325 mg Oral Daily  . atorvastatin  40 mg Oral Daily  . cholecalciferol  1,000 Units Oral Daily  . famotidine  20 mg Oral Daily  . febuxostat  80 mg Oral Daily  . gabapentin  300 mg Oral BID  . heparin  5,000 Units Subcutaneous Q8H  .  hydrALAZINE  50 mg Oral TID  . insulin aspart  0-15 Units Subcutaneous TID WC  . insulin glargine  20 Units Subcutaneous QHS  . metolazone  2.5 mg Oral Daily  . metoprolol succinate  50 mg Oral Daily  . potassium chloride  40 mEq Oral BID  . senna-docusate  1 tablet Oral BID    Infusions:   PRN Medications: acetaminophen **OR** acetaminophen, Melatonin, ondansetron **OR** ondansetron (ZOFRAN) IV, polyethylene glycol, polyvinyl alcohol   Assessment/Plan   1. Acute on chronic diastolic CHF with prominent RV failure: Echo this admission with EF 60-65%, AS appears at least moderate and possibly severe, mildly decreased RV systolic function with D-shaped interventricular septum.He has diuresed some in the hospital.  Volume status difficult to assess on exam. RHC showed ongoing R>L heart failure with mixed pulmonary venous/pulmonary arterial hypertension.   - Start home torsemide 120 mg BID - Follow creatinine closely.  2. Aortic stenosis: Per Dr. Aundra Dubin echo this admission, somewhat difficult study.  There is at least moderate, possibly severe AS.  It is possible that severe AS is making volume management difficult.  - TEE 09/05/2018 - AS appears severe, though only calculates to moderate-severe by continuity equation.  - The aortic valve was severely calcified.  AVA by planimetry was 0.5 cm^2.  Mean gradient 35 mmHg with AVA 1.0 cm^2 by continuity equation.  Normal caliber aorta with mild plaque in the descending thoracic aorta.  - Dr. Burt Knack saw for ?TAVR. Unable to do TAVR CT with CKD at this time. Could reassess if renal function improves.  3. Atrial fibrillation: Paroxysmal.  He has history of CVA. Maintaining NSR. He is not anticoagulated (takes ASA) due to prior intracerebral hemorrhage. 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   - Continue Toprol XL 50 mg daily.  4. CAD: Nonobstructive. - No s/s ischemia.  - Continue ASA and statin.  5.  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  6. CKD: Stage IV - Cr 2.3 - 2.5 this admission. Creatinine better 2.40 today, but BUN 83. As above, will start oral diuretics today and follow.  7. OHS/OSA: Patient has OSA, on CPAP at night.  Suspect OHS. No change.  8. Pulmonary hypertension: He has mixed pulmonary venous/pulmonary arterial HTN.  Suspect PAH component is due to OHS/OSA. No change.   Length of Stay: [place], NP  09/07/2018, 7:36 AM  Advanced Heart Failure Team Pager [phone] (M-F; [place])  Please contact [facility] for night-coverage after hours (4p -7a ) and weekends on [email or web address]  Patient seen with PA, agree with the above note.   Creatinine better today.  Exam is difficult but no JVD and 1+  chronic ankle edema => suspect he may be about as close to euvolemic as we can get him with his RV failure and renal dysfunction.   - Start torsemide 120 mg bid today.   I think he has OHS/OSA.  Has CPAP at home, think he will need home oxygen.   He has severe aortic stenosis. Dr. Burt Knack saw today for TAVR evaluation.  Given worsening renal function and baseline tenuous respiratory function, he is a difficult candidate.  Will see how he does over the next few weeks.  If renal function stabilizes, we may be able to do TAVR CTAs and coronary angiogram.  Would not do this admission with elevated BUN/creatinine.   If creatinine remains stable, think he is ready to leave hospital tomorrow.  He may benefit from rehab facility placement.   Loralie Champagne 09/07/2018 3:25 PM

## 2018-09-07 NOTE — Progress Notes (Signed)
   [name], daughter of [name]. [phone]   She called and she is very concerned about her Dads plan for discharge  He has an Aide 2 hours in the morning and at night. Otherwise he is a home alone. She was concerned and wanted to make sure he everyone is aware.   She is requesting call today from primary MD. She is a high Education officer, museum and will return calls if she is unable to answer.   I will send a message to case manager and Dr Lonny Prude.     Burleigh Brockmann NP-C  7:33 AM

## 2018-09-07 NOTE — Progress Notes (Signed)
PROGRESS NOTE    [name]  VZC:[id number] DOB: 1945/11/07 DOA: 08/31/2018 PCP: [name], MD   Brief Narrative: [name] is a 73 y.o. male with history of diastolic heart failure, morbid obesity, diabetes, CKD stage III, OSA.  Patient presented secondary to shortness of breath and was found to have an acute heart failure exacerbation in addition to acute on chronic respiratory failure requiring increased amount of oxygen.  He has been diuresed with IV Lasix per cardiology.   Assessment & Plan:   Active Problems:   Diabetes mellitus due to underlying condition, controlled, with stage 3 chronic kidney disease, with long-term current use of insulin (HCC)   Hypercholesterolemia   Essential hypertension   COPD (chronic obstructive pulmonary disease) (HCC)   Coronary artery disease involving native coronary artery of native heart without angina pectoris   OSA (obstructive sleep apnea)   PAF (paroxysmal atrial fibrillation) (HCC)   CKD (chronic kidney disease) stage 3, GFR 30-59 ml/min (HCC)   Heart failure (HCC)   Acute on chronic diastolic heart failure Patient with an EF of 60 to 65%.  Cardiology was consulted and is current lady diuresing patient with IV Lasix in addition to metolazone.  1.67 L out in the last 24 hours.  Overall weight appears to be stable. -Cardiology recommendations  Acute on chronic respiratory failure with hypoxia Secondary to acute pulmonary edema in setting of acute heart failure.  Resolved with IV diuresis.  Patient is currently on home oxygen of 2 L via nasal cannula.  Severe aortic stenosis Cardiology evaluated for possible TAVR.  This will need to be followed up as an outpatient.  No plan for procedure prior to discharge.  Paroxysmal atrial fibrillation Continue metoprolol.  History of intracranial bleed and is currently not on full anticoagulation.  Essential hypertension -Continue metoprolol, hydralazine, amlodipine  Acute  kidney injury on CKD stage III In setting of heart failure.  Is improving with diuresis.  Baseline creatinine of 2. Peak of 2.57 in setting of diuresis. -Repeat BMP in AM  Diabetes mellitus, type II -Continue Lantus and sliding scale insulin  Morbid obesity Body mass index is 42.96 kg/m.  Sleep apnea -Continue CPAP at night  History of CVA -Continue aspirin   DVT prophylaxis: Heparin Code Status:   Code Status: Full Code Family Communication: Daughter on telephone Disposition Plan: Discharge home pending cardiology recommendations   Consultants:   Cardiology  Procedures:   None  Antimicrobials:  None    Subjective: Breathing better. No chest pain.  Objective: Vitals:   09/06/18 1719 09/06/18 2205 09/07/18 0416 09/07/18 0940  BP: 115/79 137/87 (!) 107/54 136/78  Pulse:   74 73  Resp:  18 20   Temp:  98 F (36.7 C) 98 F (36.7 C)   TempSrc:  Oral    SpO2:  95% 100%   Weight:   (!) 139.7 kg   Height:        Intake/Output Summary (Last 24 hours) at 09/07/2018 1010 Last data filed at 09/07/2018 1000 Gross per 24 hour  Intake 1528.83 ml  Output 2121 ml  Net -592.17 ml   Filed Weights   09/05/18 0304 09/06/18 0646 09/07/18 0416  Weight: (!) 149.3 kg (!) 147.5 kg (!) 139.7 kg    Examination:  General exam: Appears calm and comfortable Respiratory system: Mild rales at bases, otherwise clear and diminished. Respiratory effort normal. Cardiovascular system: S1 & S2 heard, RRR. Gastrointestinal system: Abdomen is nondistended, soft and nontender. No organomegaly or  masses felt. Normal bowel sounds heard. Central nervous system: Alert and oriented. No focal neurological deficits. Extremities: No edema. No calf tenderness Skin: No cyanosis. No rashes Psychiatry: Judgement and insight appear normal. Mood & affect appropriate.     Data Reviewed: I have personally reviewed following labs and imaging studies  CBC: Recent Labs  Lab 08/31/18 1406  09/05/18 0505 09/06/18 0450 09/07/18 0522  WBC 12.3* 12.1* 11.9* 14.2*  NEUTROABS 9.2*  --  9.1* 11.0*  HGB 12.3* 10.6* 10.6* 11.0*  HCT 43.4 37.0* 36.8* 37.4*  MCV 82.2 81.5 83.8 81.3  PLT 226 204 193 283   Basic Metabolic Panel: Recent Labs  Lab 09/03/18 0422 09/04/18 0543 09/05/18 0505 09/06/18 0450 09/07/18 0522  NA 138 138 139 139 138  K 3.3* 3.8 3.2* 3.2* 3.6  CL 96* 95* 96* 96* 96*  CO2 31 30 32 31 30  GLUCOSE 150* 152* 157* 173* 143*  BUN 70* 74* 78* 80* 83*  CREATININE 2.52* 2.37* 2.50* 2.57* 2.40*  CALCIUM 8.5* 8.9 8.9 8.9 9.0   GFR: Estimated Creatinine Clearance: 41 mL/min (A) (by C-G formula based on SCr of 2.4 mg/dL (H)). Liver Function Tests: No results for input(s): AST, ALT, ALKPHOS, BILITOT, PROT, ALBUMIN in the last 168 hours. No results for input(s): LIPASE, AMYLASE in the last 168 hours. No results for input(s): AMMONIA in the last 168 hours. Coagulation Profile: No results for input(s): INR, PROTIME in the last 168 hours. Cardiac Enzymes: No results for input(s): CKTOTAL, CKMB, CKMBINDEX, TROPONINI in the last 168 hours. BNP (last 3 results) No results for input(s): PROBNP in the last 8760 hours. HbA1C: No results for input(s): HGBA1C in the last 72 hours. CBG: Recent Labs  Lab 09/06/18 0723 09/06/18 1233 09/06/18 1625 09/06/18 2205 09/07/18 0743  GLUCAP 146* 181* 183* 192* 143*   Lipid Profile: No results for input(s): CHOL, HDL, LDLCALC, TRIG, CHOLHDL, LDLDIRECT in the last 72 hours. Thyroid Function Tests: No results for input(s): TSH, T4TOTAL, FREET4, T3FREE, THYROIDAB in the last 72 hours. Anemia Panel: No results for input(s): VITAMINB12, FOLATE, FERRITIN, TIBC, IRON, RETICCTPCT in the last 72 hours. Sepsis Labs: No results for input(s): PROCALCITON, LATICACIDVEN in the last 168 hours.  No results found for this or any previous visit (from the past 240 hour(s)).       Radiology Studies: No results  found.      Scheduled Meds: . amLODipine  10 mg Oral Daily  . aspirin EC  325 mg Oral Daily  . atorvastatin  40 mg Oral Daily  . cholecalciferol  1,000 Units Oral Daily  . famotidine  20 mg Oral Daily  . febuxostat  80 mg Oral Daily  . gabapentin  300 mg Oral BID  . heparin  5,000 Units Subcutaneous Q8H  . hydrALAZINE  50 mg Oral TID  . insulin aspart  0-15 Units Subcutaneous TID WC  . insulin glargine  20 Units Subcutaneous QHS  . metolazone  2.5 mg Oral Daily  . metoprolol succinate  50 mg Oral Daily  . potassium chloride  40 mEq Oral BID  . senna-docusate  1 tablet Oral BID  . torsemide  120 mg Oral BID   Continuous Infusions:   LOS: 7 days     Cordelia Poche, MD [facility] 09/07/2018, 10:10 AM  If 7PM-7AM, please contact night-coverage [email or web address]

## 2018-09-07 NOTE — Clinical Social Work Note (Addendum)
Left voicemail for patient's [facility] social worker to see if they would approve SNF. Per MD, patient said he would think about SNF placement.  Dayton Scrape, [place] [phone]  2:18 pm Received call back from [place], [place] Education officer, museum. She said patient was in SNF one year ago and hated it. He asked to go home daily. Ann offered to increase his services if patient is interested. They are open for patient to go there Monday-Friday 8:00-5:00. In addition to aide that is there in the morning and at night, he has a community paramedic that comes once per week to arrange his medications. Patient's daughter [name] lives in [place] but he has another daughter, granddaughter, sister, and nephew that help him run errands and assist as needed. Lelon Frohlich stated if going home does not work out, then they can look into SNF. MD and RNCM aware. They will transport him home with stable.  Dayton Scrape, [place]

## 2018-09-08 LAB — CBC WITH DIFFERENTIAL/PLATELET
ABS IMMATURE GRANULOCYTES: 0.07 10*3/uL (ref 0.00–0.07)
Basophils Absolute: 0 10*3/uL (ref 0.0–0.1)
Basophils Relative: 0 %
Eosinophils Absolute: 0.1 10*3/uL (ref 0.0–0.5)
Eosinophils Relative: 1 %
HCT: 39.9 % (ref 39.0–52.0)
HEMOGLOBIN: 11.4 g/dL — AB (ref 13.0–17.0)
Immature Granulocytes: 1 %
Lymphocytes Relative: 11 %
Lymphs Abs: 1.3 10*3/uL (ref 0.7–4.0)
MCH: 23.2 pg — ABNORMAL LOW (ref 26.0–34.0)
MCHC: 28.6 g/dL — ABNORMAL LOW (ref 30.0–36.0)
MCV: 81.3 fL (ref 80.0–100.0)
MONOS PCT: 9 %
Monocytes Absolute: 1.1 10*3/uL — ABNORMAL HIGH (ref 0.1–1.0)
Neutro Abs: 10 10*3/uL — ABNORMAL HIGH (ref 1.7–7.7)
Neutrophils Relative %: 78 %
Platelets: 210 10*3/uL (ref 150–400)
RBC: 4.91 MIL/uL (ref 4.22–5.81)
RDW: 17 % — ABNORMAL HIGH (ref 11.5–15.5)
WBC: 12.6 10*3/uL — ABNORMAL HIGH (ref 4.0–10.5)
nRBC: 0 % (ref 0.0–0.2)

## 2018-09-08 LAB — BASIC METABOLIC PANEL
Anion gap: 12 (ref 5–15)
BUN: 82 mg/dL — ABNORMAL HIGH (ref 8–23)
CO2: 31 mmol/L (ref 22–32)
Calcium: 9.5 mg/dL (ref 8.9–10.3)
Chloride: 95 mmol/L — ABNORMAL LOW (ref 98–111)
Creatinine, Ser: 2.48 mg/dL — ABNORMAL HIGH (ref 0.61–1.24)
GFR calc Af Amer: 29 mL/min — ABNORMAL LOW (ref >60)
GFR calc non Af Amer: 25 mL/min — ABNORMAL LOW (ref >60)
GLUCOSE: 157 mg/dL — AB (ref 70–99)
Potassium: 3.5 mmol/L (ref 3.5–5.1)
Sodium: 138 mmol/L (ref 135–145)

## 2018-09-08 LAB — GLUCOSE, CAPILLARY
Glucose-Capillary: 152 mg/dL — ABNORMAL HIGH (ref 70–99)
Glucose-Capillary: 179 mg/dL — ABNORMAL HIGH (ref 70–99)

## 2018-09-08 MED ORDER — INSULIN GLARGINE 100 UNIT/ML ~~LOC~~ SOLN: 25.0000 [IU] | Freq: Every day | SUBCUTANEOUS | Status: DC

## 2018-09-08 MED ORDER — TORSEMIDE 20 MG PO TABS: 120.0000 mg | ORAL_TABLET | Freq: Two times a day (BID) | ORAL | Status: DC

## 2018-09-08 MED ORDER — METOLAZONE 2.5 MG PO TABS: 2.5000 mg | ORAL_TABLET | ORAL | Status: DC

## 2018-09-08 MED ORDER — ASPIRIN 81 MG PO CHEW: 81.0000 mg | CHEWABLE_TABLET | Freq: Every day | ORAL | Status: DC

## 2018-09-08 MED ORDER — POTASSIUM CHLORIDE CRYS ER 20 MEQ PO TBCR: 40.0000 meq | EXTENDED_RELEASE_TABLET | Freq: Two times a day (BID) | ORAL | Status: DC

## 2018-09-08 NOTE — Clinical Social Work Placement (Signed)
   CLINICAL SOCIAL WORK PLACEMENT  NOTE  Date:  09/08/2018  Patient Details  Name: [name] MRN: [id number] Date of Birth: [birth year]  Clinical Social Work is seeking post-discharge placement for this patient at the [place] level of care (*CSW will initial, date and re-position this form in  chart as items are completed):      Patient/family provided with [place] Work Department's list of facilities offering this level of care within the geographic area requested by the patient (or if unable, by the patient's family).      Patient/family informed of their freedom to choose among providers that offer the needed level of care, that participate in Medicare, Medicaid or managed care program needed by the patient, have an available bed and are willing to accept the patient.      Patient/family informed of 's ownership interest in [facility] and [facility], as well as of the fact that they are under no obligation to receive care at these facilities.  PASRR submitted to EDS on 09/08/18     PASRR number received on       Existing PASRR number confirmed on 09/08/18     FL2 transmitted to all facilities in geographic area requested by pt/family on 09/08/18     FL2 transmitted to all facilities within larger geographic area on       Patient informed that his/her managed care company has contracts with or will negotiate with certain facilities, including the following:            Patient/family informed of bed offers received.  Patient chooses bed at       Physician recommends and patient chooses bed at      Patient to be transferred to   on  .  Patient to be transferred to facility by       Patient family notified on   of transfer.  Name of family member notified:        PHYSICIAN Please sign FL2     Additional Comment:    _______________________________________________ Candie Chroman, LCSW 09/08/2018, 10:20  AM

## 2018-09-08 NOTE — Clinical Social Work Note (Signed)
CSW facilitated patient discharge including contacting patient family and facility to confirm patient discharge plans. Clinical information faxed to facility and family agreeable with plan. [facility] will pick patient up and transport him to [facility]. RN to call report prior to discharge [phone]).  CSW will sign off for now as social work intervention is no longer needed. Please consult [place] again if new needs arise.  Dayton Scrape, [place]

## 2018-09-08 NOTE — Clinical Social Work Note (Signed)
Clinical Social Work Assessment  Patient Details  Name: [name] MRN: [id number] Date of Birth: 08/21/1945  Date of referral:  09/08/18               Reason for consult:  Facility Placement, Discharge Planning                Permission sought to share information with:  Chartered certified accountant granted to share information::  Yes, Verbal Permission Granted  Name::        Agency::  SNF's  Relationship::     Contact Information:     Housing/Transportation Living arrangements for the past 2 months:  Single Family Home Source of Information:  Patient, Medical Team, Other (Comment Required)([facility] Education officer, museum) Patient Interpreter Needed:  None Criminal Activity/Legal Involvement Pertinent to Current Situation/Hospitalization:  No - Comment as needed Significant Relationships:  Adult Children, Other Family Members Lives with:  Self Do you feel safe going back to the place where you live?  Yes Need for family participation in patient care:  Yes (Comment)  Care giving concerns:  Daughter concerned with patient returning home alone and wants SNF placement.   Social Worker assessment / plan:  CSW met with [facility] Education officer, museum. She said she has spoken to patient and he is agreeable to SNF placement. They are agreeable as well and said patient's first preference is [facility], second is [facility], and third is [facility]. They will work on ordering him a bariatric bed. CSW met with patient and confirmed he is agreeable to SNF and that [facility] is first preference. CSW asked him if he wanted CSW to notify his daughter [name] and he said she already knew about it. Sent referral to the three [facility]-cont[facility] facilities and notified [facility] admissions coordinator that they are first preference. No further concerns. CSW encouraged patient to contact CSW as needed. CSW will continue to follow patient for support and facilitate discharge to SNF once arrangements are  made.  Employment status:  Retired Forensic scientist:  Other (Comment Required)([facility]) PT Recommendations:  (Supervision for mobility/OOB) Information / Referral to community resources:  [place]  Patient/Family's Response to care:  Patient agreeable to SNF placement. Patient's family supportive and involved in patient's care. Patient appreciated social work intervention.  Patient/Family's Understanding of and Emotional Response to Diagnosis, Current Treatment, and Prognosis:  Patient has a good understanding of the reason for admission and social work consult. Patient appears pleased with hospital care.  Emotional Assessment Appearance:  Appears stated age Attitude/Demeanor/Rapport:  Engaged, Gracious Affect (typically observed):  Accepting, Appropriate, Calm, Pleasant Orientation:  Oriented to Self, Oriented to Place, Oriented to  Time, Oriented to Situation Alcohol / Substance use:  Never Used Psych involvement (Current and /or in the community):  No (Comment)  Discharge Needs  Concerns to be addressed:  Care Coordination Readmission within the last 30 days:  No Current discharge risk:  Lives alone Barriers to Discharge:  Other([facility] ordering bariatric bed. Just sent out referral for [facility]-cont[facility] facilities to review.)   Candie Chroman, LCSW 09/08/2018, 10:17 AM

## 2018-09-08 NOTE — Progress Notes (Signed)
Patient is on CPAP HS. 2L O2 bleed in. Patient tolerating well.

## 2018-09-08 NOTE — Discharge Summary (Signed)
Physician Discharge Summary  [name] UJW:[id number] DOB: [birth year] DOA: 08/31/2018  PCP: [name], MD  Admit date: 08/31/2018 Discharge date: 09/08/2018  Admitted From: Home Disposition: SNF  Recommendations for Outpatient Follow-up:  1. Follow up with PCP in 1 week 2. Follow up with heart failure clinic 3. Please obtain BMP/CBC in one week 4. Please follow up on the following pending results: None  Home Health: SNF Equipment/Devices: Oxygen  Discharge Condition: Stable CODE STATUS: Full code Diet recommendation: Heart healthy/carb modified   Brief/Interim Summary:  Admission HPI written by Tawni Millers, MD   Chief Complaint: dyspnea.   HPI: [name] is a 73 y.o. male with medical history significant of chronic diastolic heart failure, paroxysmal atrial fibrillation, hypertensive cardiomyopathy, coronary artery disease, chronic kidney disease, morbid obesity, type 2 diabetes mellitus and obstructive sleep apnea.  He also has blindness, severe peripheral neuropathy and dyslipidemia.   Patient goes to [facility] for [facility] medical care.  He reports developing dyspnea for the last 2 weeks which has been progressive and worsening, to the point where he is dyspneic with minimal efforts, [facility] at [facility] he was found hypoxic.  His symptoms seem to be worse with exertion, no improving factors, it has been associated with PND, orthopnea and lower extremity edema.  He is on torsemide for diuresis, he goes to [facility] 3 times weekly, and he has an aide who helps him with his activities of daily living, there is question about how compliant is he  with his salt and fluids restricted diet.  His functional physical capacity is very low, he uses a cane for ambulation.   Hospital course:  Acute on chronic diastolic heart failure Patient with an EF of 60 to 65%.  Cardiology was consulted and is current diuresing patient with IV Lasix in addition  to metolazone. Patient down 20 lbs since admission. Discharge weight of 317.9 lbs. Discharged on Torsemide, metolazone, potassium. Patient will need follow up with the heart failure clinic.  Acute on chronic respiratory failure with hypoxia Secondary to acute pulmonary edema in setting of acute heart failure.  Resolved with IV diuresis.  Patient is currently on home oxygen of 2 L via nasal cannula. Needs up to 4 L with ambulation.  Severe aortic stenosis Cardiology evaluated for possible TAVR.  This will need to be followed up as an outpatient.  No plan for procedure prior to discharge.  Paroxysmal atrial fibrillation Continue metoprolol.  History of intracranial bleed and is currently not on full anticoagulation.  Essential hypertension Continued metoprolol, hydralazine, amlodipine  Acute kidney injury on CKD stage III In setting of heart failure. Baseline creatinine of 2. Peak of 2.57 in setting of diuresis. Creatinine now stable with diuresis. Repeat BMP to ensure this is not worsening.  Diabetes mellitus, type II Lantus dose adjusted while inpatient. Discharged on Lantus 25 units at night. If patient resumes prior to hospital diet, this will likely need to be adjusted.  Morbid obesity Body mass index is 42.96 kg/m.  Sleep apnea Continued CPAP at night  History of CVA Continued aspirin  Discharge Diagnoses:  Active Problems:   Diabetes mellitus due to underlying condition, controlled, with stage 3 chronic kidney disease, with long-term current use of insulin (HCC)   Hypercholesterolemia   Essential hypertension   COPD (chronic obstructive pulmonary disease) (HCC)   Coronary artery disease involving native coronary artery of native heart without angina pectoris   OSA (obstructive sleep apnea)  PAF (paroxysmal atrial fibrillation) (HCC)   CKD (chronic kidney disease) stage 3, GFR 30-59 ml/min (HCC)   Heart failure (HCC)   Acute on chronic congestive heart failure  [facility])    Discharge Instructions  Discharge Instructions    Diet - low sodium heart healthy   Complete by:  As directed    Increase activity slowly   Complete by:  As directed      Allergies as of 09/08/2018   No Known Allergies     Medication List    STOP taking these medications   TOUJEO SOLOSTAR 300 UNIT/ML Sopn Generic drug:  Insulin Glargine Replaced by:  insulin glargine 100 UNIT/ML injection     TAKE these medications   acetaminophen 650 MG CR tablet Commonly known as:  TYLENOL Take 1,300 mg by mouth 2 (two) times daily.   albuterol 108 (90 Base) MCG/ACT inhaler Commonly known as:  PROVENTIL HFA;VENTOLIN HFA Inhale 2 puffs into the lungs every 6 (six) hours as needed for wheezing or shortness of breath.   amLODipine 10 MG tablet Commonly known as:  NORVASC Take 10 mg by mouth daily. Reported on 12/09/2015   antiseptic oral rinse Liqd 15 mLs by Mouth Rinse route 3 (three) times daily.   aspirin 81 MG tablet Take 81 mg by mouth daily.   atorvastatin 40 MG tablet Commonly known as:  LIPITOR Take 1 tablet (40 mg total) by mouth daily.   BIOFREEZE EX Apply 1 application topically 3 (three) times daily as needed (pain). Reported on 10/27/2015   cholecalciferol 1000 units tablet Commonly known as:  VITAMIN D Take 1,000 Units by mouth daily.   citalopram 20 MG tablet Commonly known as:  CELEXA Take 20 mg by mouth daily.   eucerin cream Apply 1 application topically 2 (two) times daily.   famotidine 20 MG tablet Commonly known as:  PEPCID Take 20 mg by mouth daily.   gabapentin 300 MG capsule Commonly known as:  NEURONTIN Take 300 mg by mouth 2 (two) times daily.   hydrALAZINE 50 MG tablet Commonly known as:  APRESOLINE Take 1 tablet (50 mg total) 3 (three) times daily by mouth.   insulin glargine 100 UNIT/ML injection Commonly known as:  LANTUS Inject 0.25 mLs (25 Units total) into the skin at bedtime. Replaces:  TOUJEO SOLOSTAR 300 UNIT/ML  Sopn   lidocaine 5 % Commonly known as:  LIDODERM Place 1 patch onto the skin daily as needed (back pain). Remove & Discard patch within 12 hours or as directed by MD   Melatonin 3 MG Caps Take 1 capsule by mouth at bedtime as needed (sleep).   methylcellulose 1 % ophthalmic solution Commonly known as:  ARTIFICIAL TEARS Place 1 drop into both eyes 2 (two) times daily as needed (dry eyes).   metolazone 2.5 MG tablet Commonly known as:  ZAROXOLYN Take 1 tablet (2.5 mg total) by mouth every other day. Start taking on:  September 10, 2018 What changed:  when to take this   metoprolol succinate 50 MG 24 hr tablet Commonly known as:  TOPROL-XL Take 1 tablet (50 mg total) by mouth daily. Take with or immediately following a meal.   polyethylene glycol packet Commonly known as:  MIRALAX / GLYCOLAX Take 17 g by mouth daily as needed for moderate constipation.   potassium chloride SA 20 MEQ tablet Commonly known as:  K-DUR,KLOR-CON Take 2 tablets (40 mEq total) by mouth 2 (two) times daily. What changed:    how much to take  when to take this  additional instructions   sennosides-docusate sodium 8.6-50 MG tablet Commonly known as:  SENOKOT-S Take 2 tablets by mouth 2 (two) times daily.   torsemide 20 MG tablet Commonly known as:  DEMADEX Take 6 tablets (120 mg total) by mouth 2 (two) times daily.   ULORIC 80 MG Tabs Generic drug:  Febuxostat Take 80 mg by mouth daily.   VICTOZA 18 MG/3ML Sopn Generic drug:  liraglutide Inject 1.8 mg into the skin daily.       Contact information for follow-up providers    [name], MD.   Specialty:  Family Medicine Why:  Claudia Desanctis will make appointment Contact information: [name] [address] [phone]        Larey Dresser, MD. Schedule an appointment as soon as possible for a visit in 2 week(s).   Specialty:  Cardiology Why:  Heart failure Contact information: [address]  [place] [address] [phone]            Contact information for after-discharge care    Destination    [name] Preferred SNF .   Service:  Skilled Nursing Contact information: [address] [place] [phone]                 No Known Allergies  Consultations:  Cardiology   Procedures/Studies: Dg Chest 2 View  Result Date: 08/31/2018 CLINICAL DATA:  Severe shortness of breath EXAM: CHEST - 2 VIEW COMPARISON:  08/21/2018. FINDINGS: Cardiac pacer with lead tips over the right atrium right ventricle. Stable cardiomegaly with improving pulmonary venous congestion. Previously identified pulmonary interstitial prominence has cleared. Mild basilar atelectasis. No pleural effusion. No pneumothorax. No acute bony abnormality. IMPRESSION: 1.  Cardiac pacer s in table position. 2. Stable cardiomegaly with mild pulmonary venous congestion. Pulmonary venous congestion has improved from prior exam. Previously identified interstitial edema has cleared. 3.  Bibasilar atelectasis. Electronically Signed   By: Marcello Moores  Register   On: 08/31/2018 15:28   Dg Chest 2 View  Result Date: 08/21/2018 CLINICAL DATA:  Cough and shortness of breath EXAM: CHEST - 2 VIEW COMPARISON:  October 14, 2017 FINDINGS: There is atelectatic change in the lung bases. There may be early consolidation in the left base. There is cardiomegaly with pulmonary venous hypertension. Pacemaker leads are attached to the right atrium and right ventricle. There is aortic atherosclerosis. No bone lesions. No evident adenopathy. IMPRESSION: Pulmonary vascular congestion. Bibasilar atelectasis with questionable early pneumonia left base. Pacemaker leads attached to right atrium and right ventricle. There is aortic atherosclerosis. Aortic Atherosclerosis (ICD10-I70.0). These results will be called to the ordering clinician or representative by the Radiologist Assistant, and  communication documented in the PACS or zVision Dashboard. Electronically Signed   By: Lowella Grip III M.D.   On: 08/21/2018 15:58     Transthoracic Echocardiogram (1/17) Study Conclusions  - Left ventricle: The cavity size was normal. Wall thickness was   increased in a pattern of moderate LVH. Systolic function was   normal. The estimated ejection fraction was in the range of 60%   to 65%. Wall motion was normal; there were no regional wall   motion abnormalities. - Aortic valve: There was severe stenosis. Valve area (VTI): 1.32   cm^2. Valve area (Vmax): 1.38 cm^2. Valve area (Vmean): 1.23   cm^2. - Left atrium: The atrium was mildly dilated. - Right atrium: The atrium was moderately dilated.  Transesophageal Echocardiogram (1/17) - LVEF 55-60% - AS appears severe, though only calculates to moderate-severe by continuity equation.  - The aortic valve was severely calcified. AVA by planimetry was 0.5 cm^2. Mean gradient 35 mmHg with AVA 1.0 cm^2 by continuity equation. Normal caliber aorta with mild plaque in the descending thoracic aorta.   Right heart catheterization (1/21) 1. Elevated R>L heart filling pressures, suspect right > left heart failure.  2. 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  Suspect OHS/OSA plays a role.  3. Preserved cardiac output.    Subjective: No shortness of breath or chest pain.  Discharge Exam: Vitals:   09/08/18 0335 09/08/18 0900  BP: (!) 163/89 (!) 166/88  Pulse: 75   Resp: 20   Temp: 98 F (36.7 C) 98.3 F (36.8 C)  SpO2: 90%    Vitals:   09/07/18 2003 09/07/18 2154 09/08/18 0335 09/08/18 0900  BP: 132/77 (!) 147/79 (!) 163/89 (!) 166/88  Pulse: 70 72 75   Resp: 18  20   Temp: 97.7 F (36.5 C)  98 F (36.7 C) 98.3 F (36.8 C)  TempSrc: Oral  Oral Oral  SpO2: 94% 94% 90%   Weight:   (!) 144.2 kg   Height:        General: Pt is alert, awake, not in acute distress Cardiovascular: RRR, S1/S2 +, no  rubs, no gallops Respiratory: CTA bilaterally but diminished, no wheezing, no rhonchi Abdominal: Soft, NT, ND, bowel sounds + Extremities: Significant LE edema bilaterally, no cyanosis    The results of significant diagnostics from this hospitalization (including imaging, microbiology, ancillary and laboratory) are listed below for reference.     Microbiology: No results found for this or any previous visit (from the past 240 hour(s)).   Labs: BNP (last 3 results) Recent Labs    08/31/18 1406  BNP 841.3*   Basic Metabolic Panel: Recent Labs  Lab 09/04/18 0543 09/05/18 0505 09/06/18 0450 09/07/18 0522 09/08/18 0903  NA 138 139 139 138 138  K 3.8 3.2* 3.2* 3.6 3.5  CL 95* 96* 96* 96* 95*  CO2 30 32 31 30 31   GLUCOSE 152* 157* 173* 143* 157*  BUN 74* 78* 80* 83* 82*  CREATININE 2.37* 2.50* 2.57* 2.40* 2.48*  CALCIUM 8.9 8.9 8.9 9.0 9.5   Liver Function Tests: No results for input(s): AST, ALT, ALKPHOS, BILITOT, PROT, ALBUMIN in the last 168 hours. No results for input(s): LIPASE, AMYLASE in the last 168 hours. No results for input(s): AMMONIA in the last 168 hours. CBC: Recent Labs  Lab 09/05/18 0505 09/06/18 0450 09/07/18 0522 09/08/18 0903  WBC 12.1* 11.9* 14.2* 12.6*  NEUTROABS  --  9.1* 11.0* 10.0*  HGB 10.6* 10.6* 11.0* 11.4*  HCT 37.0* 36.8* 37.4* 39.9  MCV 81.5 83.8 81.3 81.3  PLT 204 193 209 210   Cardiac Enzymes: No results for input(s): CKTOTAL, CKMB, CKMBINDEX, TROPONINI in the last 168 hours. BNP: Invalid input(s): POCBNP CBG: Recent Labs  Lab 09/07/18 1143 09/07/18 1625 09/07/18 2117 09/08/18 0807 09/08/18 1224  GLUCAP 186* 210* 210* 152* 179*   D-Dimer No results for input(s): DDIMER in the last 72 hours. Hgb A1c No results for input(s): HGBA1C in the last 72 hours. Lipid Profile No results for input(s): CHOL, HDL, LDLCALC, TRIG, CHOLHDL, LDLDIRECT in the last 72 hours. Thyroid function studies No results for input(s): TSH,  T4TOTAL, T3FREE, THYROIDAB in the last 72 hours.  Invalid input(s): FREET3 Anemia work up No results for input(s): VITAMINB12, FOLATE,  FERRITIN, TIBC, IRON, RETICCTPCT in the last 72 hours. Urinalysis    Component Value Date/Time   COLORURINE YELLOW 08/15/2017 1527   APPEARANCEUR HAZY (A) 08/15/2017 1527   LABSPEC 1.010 08/15/2017 1527   PHURINE 7.0 08/15/2017 1527   GLUCOSEU NEGATIVE 08/15/2017 1527   GLUCOSEU > 1000 mg/dL (A) 10/30/2009 0142   HGBUR NEGATIVE 08/15/2017 1527   HGBUR trace-lysed 03/25/2010 [place] 08/15/2017 [place] 08/15/2017 1527   PROTEINUR NEGATIVE 08/15/2017 1527   UROBILINOGEN 0.2 11/04/2013 1944   NITRITE NEGATIVE 08/15/2017 1527   LEUKOCYTESUR LARGE (A) 08/15/2017 1527     SIGNED:   Cordelia Poche, MD [facility] 09/08/2018, 2:52 PM

## 2018-09-08 NOTE — Progress Notes (Addendum)
Advanced Heart Failure Rounding Note  PCP-Cardiologist: No primary care provider on file.   Subjective:    Cr 2.5 -> 2.57 -> 2.40 -> BMET not yet drawn today. Switched to torsemide yesterday. Good diuresis with I/O -1.4 L. Yesterday's weight was in accurate. He is down 22 lbs total.   PACE program is able to provide more support at home. Apparently pt hated being in SNF in the past. CSW following.  Denies SOB, CP, or orthopnea.  TEE 09/05/2018 - LVEF 55-60% - AS appears severe, though only calculates to moderate-severe by continuity equation.  - The aortic valve was severely calcified.  AVA by planimetry was 0.5 cm^2.  Mean gradient 35 mmHg with AVA 1.0 cm^2 by continuity equation.  Normal caliber aorta with mild plaque in the descending thoracic aorta.   RHC Procedural Findings (1/21): Hemodynamics (mmHg) RA mean 15 RV 71/18 PA 68/23, mean 44 PCWP mean 20 Oxygen saturations: PA 64% AO 94% Cardiac Output (Fick) 8.01  Cardiac Index (Fick) 3.08 PVR 3.0 WU  Objective:   Weight Range: (!) 144.2 kg Body mass index is 44.34 kg/m.   Vital Signs:   Temp:  [97.7 F (36.5 C)-98 F (36.7 C)] 98 F (36.7 C) (01/24 0335) Pulse Rate:  [70-80] 75 (01/24 0335) Resp:  [18-20] 20 (01/24 0335) BP: (131-163)/(58-89) 163/89 (01/24 0335) SpO2:  [90 %-98 %] 90 % (01/24 0335) Weight:  [144.2 kg] 144.2 kg (01/24 0335) Last BM Date: 09/07/18  Weight change: Filed Weights   09/06/18 0646 09/07/18 0416 09/08/18 0335  Weight: (!) 147.5 kg (!) 139.7 kg (!) 144.2 kg    Intake/Output:   Intake/Output Summary (Last 24 hours) at 09/08/2018 0745 Last data filed at 09/08/2018 0548 Gross per 24 hour  Intake 1240 ml  Output 2601 ml  Net -1361 ml      Physical Exam    General:No resp difficulty. HEENT: Normal Neck: Thick. JVP difficult to assess. Carotids 2+ bilat; no bruits. No thyromegaly or nodule noted. Cor: PMI nondisplaced. RRR, 3/6 AS Lungs: CTAB, normal effort. Abdomen:  Soft, non-tender, non-distended, no HSM. No bruits or masses. +BS  Extremities: No cyanosis, clubbing, or rash. R and LLE chronic 1+ edema. Neuro: Alert & orientedx3, cranial nerves grossly intact. moves all 4 extremities w/o difficulty. Affect pleasant  Telemetry   NSR 70s with rare Vpacing. Personally reviewed.   Labs    CBC Recent Labs    09/06/18 0450 09/07/18 0522  WBC 11.9* 14.2*  NEUTROABS 9.1* 11.0*  HGB 10.6* 11.0*  HCT 36.8* 37.4*  MCV 83.8 81.3  PLT 193 161   Basic Metabolic Panel Recent Labs    09/06/18 0450 09/07/18 0522  NA 139 138  K 3.2* 3.6  CL 96* 96*  CO2 31 30  GLUCOSE 173* 143*  BUN 80* 83*  CREATININE 2.57* 2.40*  CALCIUM 8.9 9.0   Liver Function Tests No results for input(s): AST, ALT, ALKPHOS, BILITOT, PROT, ALBUMIN in the last 72 hours. No results for input(s): LIPASE, AMYLASE in the last 72 hours. Cardiac Enzymes No results for input(s): CKTOTAL, CKMB, CKMBINDEX, TROPONINI in the last 72 hours.  BNP: BNP (last 3 results) Recent Labs    08/31/18 1406  BNP 151.0*    ProBNP (last 3 results) No results for input(s): PROBNP in the last 8760 hours.   D-Dimer No results for input(s): DDIMER in the last 72 hours. Hemoglobin A1C No results for input(s): HGBA1C in the last 72 hours. Fasting Lipid Panel No  results for input(s): CHOL, HDL, LDLCALC, TRIG, CHOLHDL, LDLDIRECT in the last 72 hours. Thyroid Function Tests No results for input(s): TSH, T4TOTAL, T3FREE, THYROIDAB in the last 72 hours.  Invalid input(s): FREET3  Other results:   Imaging    No results found.   Medications:     Scheduled Medications: . amLODipine  10 mg Oral Daily  . aspirin EC  325 mg Oral Daily  . atorvastatin  40 mg Oral Daily  . cholecalciferol  1,000 Units Oral Daily  . famotidine  20 mg Oral Daily  . febuxostat  80 mg Oral Daily  . gabapentin  300 mg Oral BID  . heparin  5,000 Units Subcutaneous Q8H  . hydrALAZINE  50 mg Oral TID  .  insulin aspart  0-15 Units Subcutaneous TID WC  . insulin glargine  20 Units Subcutaneous QHS  . metolazone  2.5 mg Oral Daily  . metoprolol succinate  50 mg Oral Daily  . potassium chloride  40 mEq Oral BID  . senna-docusate  1 tablet Oral BID  . torsemide  120 mg Oral BID    Infusions:   PRN Medications: acetaminophen **OR** acetaminophen, Melatonin, ondansetron **OR** ondansetron (ZOFRAN) IV, polyethylene glycol, polyvinyl alcohol   Assessment/Plan   1. Acute on chronic diastolic CHF with prominent RV failure: Echo this admission with EF 60-65%, AS appears at least moderate and possibly severe, mildly decreased RV systolic function with D-shaped interventricular septum.He has diuresed some in the hospital.  Volume status difficult to assess on exam. RHC showed ongoing R>L heart failure with mixed pulmonary venous/pulmonary arterial hypertension.   - Continue torsemide 120 mg BID + 2.5 mg metolazone daily (home diuretics) - BMET reordered for this am.  2. Aortic stenosis: Per Dr. Aundra Dubin echo this admission, somewhat difficult study.  There is at least moderate, possibly severe AS.  It is possible that severe AS is making volume management difficult.  - TEE 09/05/2018 - AS appears severe, though only calculates to moderate-severe by continuity equation. The aortic valve was severely calcified.  AVA by planimetry was 0.5 cm^2.  Mean gradient 35 mmHg with AVA 1.0 cm^2 by continuity equation.  Normal caliber aorta with mild plaque in the descending thoracic aorta.  - Dr. Burt Knack saw for ?TAVR. Unable to do TAVR CT with CKD at this time. Could reassess if renal function improves. No change.  3. Atrial fibrillation: Paroxysmal.  He has history of CVA. Maintaining NSR.He is not anticoagulated (takes ASA) due to prior intracerebral hemorrhage. 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   - Continue Toprol XL 50 mg daily.  4. CAD: Nonobstructive. - No s/s  ischemia.  - Continue ASA and statin.  5. 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 No change.  6. CKD: Stage IV - Cr 2.3 - 2.5 this admission. Switched to oral diuretics yesterday. BMET pending today. 7. OHS/OSA: Patient has OSA, on CPAP at night.  Suspect OHS. He will probably need O2 for home.  8. Pulmonary hypertension: He has mixed pulmonary venous/pulmonary arterial HTN.  Suspect PAH component is due to OHS/OSA. No change.   Per CSW note, [facility] program can provide more support to keep him at home.   Need to make sure BMET is stable this morning prior to discharge. I have reordered it. Will add DC rec's once it results.   Length of Stay: [place], NP  09/08/2018, 7:45 AM  Advanced Heart Failure Team Pager [phone] (  M-F; 7a - 4p)  Please contact [facility] for night-coverage after hours (4p -7a ) and weekends on [email or web address]  Patient seen with NP, agree with the above note.   BUN/creatinine stable at 82/2.48. Volume looks good on exam, weight is down about 20 lbs.  I will continue him on torsemide 120 mg bid but decrease metolazone to 2.5 mg every other day for home.   I think that he is ready for rehab placement today.   Will need to decide on suitability for TAVR in the future, will need to see how well creatinine stabilizes out.   Meds for discharge:  Torsemide 120 mg po bid Metolazone 2.5 mg every other day Amlodipine 10 mg daily Toprol XL 50 mg daily ASA 81 daily Atorvastatin 40 daily Hydralazine 50 mg tid KCl 40 bid  He will need close followup in CHF clinic.   Loralie Champagne 09/08/2018 10:45 AM

## 2018-09-08 NOTE — Care Management Important Message (Signed)
Important Message  Patient Details  Name: [name] MRN: [id number] Date of Birth: [birth year]   Medicare Important Message Given:  Yes Due to condition, unable to sign. Unsigned copy left at bedside.   Tarren Velardi P Ladina Shutters 09/08/2018, 11:10 AM

## 2018-09-08 NOTE — Progress Notes (Signed)
Physical Therapy Treatment Patient Details Name: [name] MRN: [id number] DOB: 12-20-1945 Today's Date: 09/08/2018    History of Present Illness Pt is a 73 y.o. male admitted with SOB. Worked up for CHF exacerbation. TEE 1/21 showed severe aortic stenosis. PMH includes legally blind, transmet amputation, CKD, obesity, DM, HTN, CAD, neuropathy.   PT Comments    Pt without O2 upon entering room with SpO2 81%, "I took it off to eat" (referring to his cpap); sats increased to 94% with 3L O2 [place] while seated. Today's session focused on mobility and ADL tasks as pt with urine incontinence upon standing to ambulate. Pt requires min guard and UE support to perform standing ADL tasks; assist for LB dressing/bathing. Easily fatigued by minimal activity; SpO2 down to 87% on 3L O2 [place]. Feel pt would benefit from SNF-level therapies to maximize functional mobility and independence prior to return home.   Follow Up Recommendations  SNF;Supervision for mobility/OOB     Equipment Recommendations  None recommended by PT    Recommendations for Other Services       Precautions / Restrictions Precautions Precautions: Fall Precaution Comments: Legally blind; watch SpO2 Restrictions Weight Bearing Restrictions: No    Mobility  Bed Mobility               General bed mobility comments: Received sitting in recliner; SpO2 81% on RA  Transfers Overall transfer level: Needs assistance Equipment used: Rolling walker (2 wheeled) Transfers: Sit to/from Stand Sit to Stand: Min guard         General transfer comment: Reliant on momentum to power into standing. Min guard for balance and to steady RW. Heavy reliance on UE support. Poor eccentric control  Ambulation/Gait Ambulation/Gait assistance: Min guard Gait Distance (Feet): 3 Feet Assistive device: Rolling walker (2 wheeled) Gait Pattern/deviations: Step-through pattern;Decreased stride length;Wide base of support;Trunk flexed      General Gait Details: Amb forward/backwards with RW and min guard for balance to perform standing ADL tasks. SpO2 down to 87% on 3L O2 [name]   Stairs             Wheelchair Mobility    Modified Rankin (Stroke Patients Only)       Balance Overall balance assessment: Needs assistance Sitting-balance support: Feet supported;No upper extremity supported Sitting balance-Leahy Scale: Fair     Standing balance support: Bilateral upper extremity supported Standing balance-Leahy Scale: Fair                              Cognition Arousal/Alertness: Awake/alert Behavior During Therapy: Flat affect Overall Cognitive Status: Within Functional Limits for tasks assessed                                 General Comments: WFL for simple tasks      Exercises      General Comments General comments (skin integrity, edema, etc.): Educ on need to wear O2  even at rest due to low SpO2      Pertinent Vitals/Pain Pain Assessment: No/denies pain    Home Living                      Prior Function            PT Goals (current goals can now be found in the care plan section) Acute Rehab PT Goals Patient Stated Goal: to  return home when ready PT Goal Formulation: With patient Time For Goal Achievement: 09/15/18 Potential to Achieve Goals: Good Progress towards PT goals: Progressing toward goals    Frequency    Min 3X/week      PT Plan Discharge plan needs to be updated    Co-evaluation              AM-PAC PT "6 Clicks" Mobility   Outcome Measure  Help needed turning from your back to your side while in a flat bed without using bedrails?: A Lot Help needed moving from lying on your back to sitting on the side of a flat bed without using bedrails?: A Little Help needed moving to and from a bed to a chair (including a wheelchair)?: A Little Help needed standing up from a chair using your arms (e.g., wheelchair or bedside chair)?:  A Little Help needed to walk in hospital room?: A Little Help needed climbing 3-5 steps with a railing? : A Lot 6 Click Score: 16    End of Session Equipment Utilized During Treatment: Oxygen Activity Tolerance: Patient limited by fatigue;No increased pain Patient left: in chair;with call bell/phone within reach Nurse Communication: Mobility status PT Visit Diagnosis: Other abnormalities of gait and mobility (R26.89);Muscle weakness (generalized) (M62.81)     Time: 0388-8280 PT Time Calculation (min) (ACUTE ONLY): 20 min  Charges:  $Therapeutic Activity: 8-22 mins                    Mabeline Caras, PT, DPT Acute Rehabilitation Services  Pager [phone] Office [phone]  Derry Lory 09/08/2018, 12:03 PM

## 2018-09-08 NOTE — NC FL2 (Signed)
[place] LEVEL OF CARE SCREENING TOOL     IDENTIFICATION  Patient Name: [name] Birthdate: 09-23-1945 Sex: male Admission Date (Current Location): 08/31/2018  [facility] and [place] Number:  Herbalist and Address:  The . [facility], [place] [address] [place], [place] [address]      Provider Number: [id number]  Attending Physician Name and Address:  Mariel Aloe, MD  Relative Name and Phone Number:       Current Level of Care: Hospital Recommended Level of Care: [place] Prior Approval Number:    Date Approved/Denied:   PASRR Number: [id number]  Discharge Plan: SNF    Current Diagnoses: Patient Active Problem List   Diagnosis Date Noted  . Acute on chronic congestive heart failure ([place])   . Heart failure ([place]) 08/31/2018  . Paroxysmal atrial tachycardia ([place]) 05/12/2018  . Aortic valve stenosis, nonrheumatic 05/12/2018  . Diabetes mellitus type 2 in obese ([place]) 05/12/2018  . Chronic diastolic heart failure ([place]) 11/11/2017  . Hypernatremia 08/15/2017  . AKI (acute kidney injury) ([place]) 08/15/2017  . Acute lower UTI 08/15/2017  . Coarse tremors 08/15/2017  . Amiodarone pulmonary toxicity   . Cellulitis   . Acute encephalopathy   . Acute pulmonary edema (HCC)   . Acute respiratory failure with hypoxia ([place]) 06/13/2017  . SSS (sick sinus syndrome) ([place]) 05/19/2016  . Cough 08/20/2015  . Morbid obesity ([place]) 07/09/2015  . Cardiac mass 10/04/2013  . CVA (cerebrovascular accident due to intracerebral hemorrhage) ([place]) 10/02/2013  . CVA (cerebral infarction) 10/01/2013  . Intracerebral hemorrhage ([place]) 10/01/2013  . ICH (intracerebral hemorrhage) ([place]) 10/01/2013  . Right knee pain 09/07/2013  . CKD (chronic kidney disease) stage 3, GFR 30-59 ml/min (HCC) 09/07/2013  . Orchitis 09/05/2013  . Unspecified protein-calorie malnutrition ([place]) 09/05/2013  . Sepsis ([place]) 08/27/2013  . Trichomonas infection  08/27/2013  . Epididymo-orchitis, acute 08/27/2013  . PAF (paroxysmal atrial fibrillation) ([place]) 06/24/2013  . OSA (obstructive sleep apnea) 05/04/2011  . Shortness of breath 03/17/2011  . Depression 10/23/2010  . Degenerative joint disease   . Cardiomyopathy, hypertensive ([place])   . Coronary artery disease involving native coronary artery of native heart without angina pectoris   . Pacemaker   . Incarcerated ventral hernia   . COPD (chronic obstructive pulmonary disease) ([place]) 11/28/2009  . Diabetes mellitus due to underlying condition, controlled, with stage 3 chronic kidney disease, with long-term current use of insulin ([place]) 07/20/2006  . Hypercholesterolemia 07/20/2006  . GOUT 07/20/2006  . MACULAR DEGENERATION 07/20/2006  . Essential hypertension 07/20/2006  . BENIGN PROSTATIC HYPERTROPHY, HX OF 07/20/2006    Orientation RESPIRATION BLADDER Height & Weight     Self, Time, Situation, Place  O2, Other (Comment)(Nasal Canula 3 L. Cpap at night: Auto titrate 18 cmH2O max 10 cmH2O min. 2 L bleed in.) Continent Weight: (!) 317 lb 14.4 oz (144.2 kg) Height:  '5\' 11"'$  (180.3 cm)  BEHAVIORAL SYMPTOMS/MOOD NEUROLOGICAL BOWEL NUTRITION STATUS  (None) (None) Continent Diet(Heart healthy/carb modified.)  AMBULATORY STATUS COMMUNICATION OF NEEDS Skin   Supervision Verbally Other (Comment)(Amputation, Cracking, Excoriated.)                       Personal Care Assistance Level of Assistance              Functional Limitations Info  Sight, Hearing, Speech Sight Info: Impaired(Legally blind.) Hearing Info: Adequate Speech Info: Adequate    SPECIAL CARE FACTORS FREQUENCY  PT (  By licensed PT), Blood pressure     PT Frequency: 5 x week              Contractures Contractures Info: Not present    Additional Factors Info  Code Status, Allergies Code Status Info: Full code Allergies Info: NKDA           Current Medications (09/08/2018):  This is the current hospital  active medication list Current Facility-Administered Medications  Medication Dose Route Frequency Provider Last Rate Last Dose  . acetaminophen (TYLENOL) tablet 650 mg  650 mg Oral Q6H PRN Arrien, Jimmy Picket, MD       Or  . acetaminophen (TYLENOL) suppository 650 mg  650 mg Rectal Q6H PRN Arrien, Jimmy Picket, MD      . amLODipine (NORVASC) tablet 10 mg  10 mg Oral Daily Arrien, Jimmy Picket, MD   10 mg at 09/08/18 1003  . aspirin EC tablet 325 mg  325 mg Oral Daily Arrien, Jimmy Picket, MD   325 mg at 09/08/18 1003  . atorvastatin (LIPITOR) tablet 40 mg  40 mg Oral Daily Arrien, Jimmy Picket, MD   40 mg at 09/08/18 1003  . cholecalciferol (VITAMIN D3) tablet 1,000 Units  1,000 Units Oral Daily Arrien, Jimmy Picket, MD   1,000 Units at 09/08/18 1003  . famotidine (PEPCID) tablet 20 mg  20 mg Oral Daily Arrien, Jimmy Picket, MD   20 mg at 09/08/18 1003  . febuxostat (ULORIC) tablet 80 mg  80 mg Oral Daily Arrien, Jimmy Picket, MD   80 mg at 09/08/18 1003  . gabapentin (NEURONTIN) capsule 300 mg  300 mg Oral BID Arrien, Jimmy Picket, MD   300 mg at 09/08/18 1002  . heparin injection 5,000 Units  5,000 Units Subcutaneous Q8H Arrien, Jimmy Picket, MD   5,000 Units at 09/08/18 0768  . hydrALAZINE (APRESOLINE) tablet 50 mg  50 mg Oral TID Tawni Millers, MD   50 mg at 09/08/18 1003  . insulin aspart (novoLOG) injection 0-15 Units  0-15 Units Subcutaneous TID WC Arrien, Jimmy Picket, MD   3 Units at 09/08/18 [phone]  . insulin glargine (LANTUS) injection 20 Units  20 Units Subcutaneous QHS Tawni Millers, MD   20 Units at 09/07/18 2149  . Melatonin TABS 3 mg  3 mg Oral QHS PRN Arrien, Jimmy Picket, MD   3 mg at 09/02/18 2114  . metolazone (ZAROXOLYN) tablet 2.5 mg  2.5 mg Oral Daily Arrien, Jimmy Picket, MD   2.5 mg at 09/08/18 1003  . metoprolol succinate (TOPROL-XL) 24 hr tablet 50 mg  50 mg Oral Daily Arrien, Jimmy Picket, MD   50 mg at  09/08/18 1003  . ondansetron (ZOFRAN) tablet 4 mg  4 mg Oral Q6H PRN Arrien, Jimmy Picket, MD       Or  . ondansetron [facility]) injection 4 mg  4 mg Intravenous Q6H PRN Arrien, Jimmy Picket, MD      . polyethylene glycol (MIRALAX / GLYCOLAX) packet 17 g  17 g Oral Daily PRN Arrien, Jimmy Picket, MD      . polyvinyl alcohol (LIQUIFILM TEARS) 1.4 % ophthalmic solution 1 drop  1 drop Both Eyes PRN Arrien, Jimmy Picket, MD      . potassium chloride SA (K-DUR,KLOR-CON) CR tablet 40 mEq  40 mEq Oral BID Shirley Friar, PA-C   40 mEq at 09/08/18 1003  . senna-docusate (Senokot-S) tablet 1 tablet  1 tablet Oral BID Arrien, Jimmy Picket, MD   1  tablet at 09/08/18 1002  . torsemide (DEMADEX) tablet 120 mg  120 mg Oral BID Georgiana Shore, NP   120 mg at 09/08/18 4584   Facility-Administered Medications Ordered in Other Encounters  Medication Dose Route Frequency Provider Last Rate Last Dose  . chlorhexidine gluconate (MEDLINE KIT) (PERIDEX) 0.12 % solution 15 mL  15 mL Mouth Rinse BID Chesley Mires, MD      . MEDLINE mouth rinse  15 mL Mouth Rinse QID Chesley Mires, MD         Discharge Medications: Please see discharge summary for a list of discharge medications.  Relevant Imaging Results:  Relevant Lab Results:   Additional Information SS#: [id number]  Candie Chroman, LCSW

## 2018-09-08 NOTE — Clinical Social Work Placement (Signed)
   CLINICAL SOCIAL WORK PLACEMENT  NOTE  Date:  09/08/2018  Patient Details  Name: [name] MRN: [id number] Date of Birth: September 03, 1945  Clinical Social Work is seeking post-discharge placement for this patient at the [place] level of care (*CSW will initial, date and re-position this form in  chart as items are completed):      Patient/family provided with [place] Work Department's list of facilities offering this level of care within the geographic area requested by the patient (or if unable, by the patient's family).      Patient/family informed of their freedom to choose among providers that offer the needed level of care, that participate in Medicare, Medicaid or managed care program needed by the patient, have an available bed and are willing to accept the patient.      Patient/family informed of [place]'s ownership interest in [facility] and [facility], as well as of the fact that they are under no obligation to receive care at these facilities.  PASRR submitted to EDS on 09/08/18     PASRR number received on       Existing PASRR number confirmed on 09/08/18     FL2 transmitted to all facilities in geographic area requested by pt/family on 09/08/18     FL2 transmitted to all facilities within larger geographic area on       Patient informed that his/her managed care company has contracts with or will negotiate with certain facilities, including the following:        Yes   Patient/family informed of bed offers received.  Patient chooses bed at [facility]     Physician recommends and patient chooses bed at      Patient to be transferred to [facility] on 09/08/18.  Patient to be transferred to facility by [facility]     Patient family notified on 09/08/18 of transfer.  Name of family member notified:  Maye Hides     PHYSICIAN Please prepare prescriptions     Additional Comment:     _______________________________________________ Candie Chroman, LCSW 09/08/2018, 3:08 PM

## 2018-09-08 NOTE — Clinical Social Work Note (Addendum)
[name] is able to accept patient today. They are waiting on bariatric bed to be delivered. Patient's daughter [name] will go to his house to pick up some of his belongings including his cpap machine before going to the facility to complete paperwork.  [name], [name] [phone]  2:28 pm SNF will use one of their own beds with a widener. [facility] will be here at 3:00 to pick him up. MD aware.  [name], [name]

## 2018-09-08 NOTE — Discharge Instructions (Signed)
[name],  You are in the hospital because of heart failure.  You are given IV Lasix which is helped her heart failure.  He has many medication adjustments which have been noted on a discharge summary.  Please adhere to these medication adjustments and follow-up with the heart failure clinic as an outpatient.

## 2018-09-11 ENCOUNTER — Ambulatory Visit: Payer: Medicare (Managed Care) | Admitting: Physician Assistant

## 2018-09-14 ENCOUNTER — Other Ambulatory Visit (HOSPITAL_COMMUNITY): Payer: Self-pay | Admitting: Cardiology

## 2018-09-15 LAB — BASIC METABOLIC PANEL
BUN: 72 — AB (ref 4–21)
Creatinine: 2.2 — AB (ref 0.6–1.3)
GLUCOSE: 217
Potassium: 4.4 (ref 3.4–5.3)
Sodium: 134 — AB (ref 137–147)

## 2018-09-15 LAB — CBC AND DIFFERENTIAL
HCT: 37 — AB (ref 41–53)
Hemoglobin: 11.8 — AB (ref 13.5–17.5)
Platelets: 223 (ref 150–399)
WBC: 12.6

## 2018-09-19 LAB — CUP PACEART INCLINIC DEVICE CHECK
Date Time Interrogation Session: 20200204164654
Implantable Lead Implant Date: 20050310
Implantable Lead Model: 5092
Implantable Lead Model: 5594
Implantable Pulse Generator Implant Date: 20140321
MDC IDC LEAD IMPLANT DT: 20050301
MDC IDC LEAD LOCATION: 753859
MDC IDC LEAD LOCATION: 753860

## 2018-09-28 ENCOUNTER — Ambulatory Visit (HOSPITAL_COMMUNITY)
Admission: RE | Admit: 2018-09-28 | Discharge: 2018-09-28 | Disposition: A | Discharge status: Home / Self Care | Payer: Medicare (Managed Care) | Source: Ambulatory Visit | Attending: Cardiology | Admitting: Cardiology

## 2018-09-28 ENCOUNTER — Encounter (HOSPITAL_COMMUNITY): Payer: Self-pay | Admitting: Cardiology

## 2018-09-28 VITALS — BP 148/76 | HR 76 | Wt 338.4 lb

## 2018-09-28 DIAGNOSIS — I110 Hypertensive heart disease with heart failure: Secondary | ICD-10-CM | POA: Insufficient documentation

## 2018-09-28 DIAGNOSIS — I4819 Other persistent atrial fibrillation: Secondary | ICD-10-CM | POA: Diagnosis not present

## 2018-09-28 DIAGNOSIS — I5032 Chronic diastolic (congestive) heart failure: Secondary | ICD-10-CM

## 2018-09-28 DIAGNOSIS — I43 Cardiomyopathy in diseases classified elsewhere: Secondary | ICD-10-CM

## 2018-09-28 DIAGNOSIS — G4733 Obstructive sleep apnea (adult) (pediatric): Secondary | ICD-10-CM | POA: Diagnosis not present

## 2018-09-28 LAB — BASIC METABOLIC PANEL
Anion gap: 12 (ref 5–15)
BUN: 47 mg/dL — AB (ref 8–23)
CO2: 30 mmol/L (ref 22–32)
Calcium: 9 mg/dL (ref 8.9–10.3)
Chloride: 94 mmol/L — ABNORMAL LOW (ref 98–111)
Creatinine, Ser: 1.82 mg/dL — ABNORMAL HIGH (ref 0.61–1.24)
GFR calc Af Amer: 43 mL/min — ABNORMAL LOW (ref >60)
GFR, EST NON AFRICAN AMERICAN: 37 mL/min — AB (ref >60)
Glucose, Bld: 237 mg/dL — ABNORMAL HIGH (ref 70–99)
POTASSIUM: 3.4 mmol/L — AB (ref 3.5–5.1)
Sodium: 136 mmol/L (ref 135–145)

## 2018-09-28 NOTE — Patient Instructions (Signed)
Labs were done today. We will call you with any ABNORMAL results. No news is good news!  EKG was completed.  Your physician recommends that you schedule a follow-up appointment in: 10 days, this appointment has been scheduled for you.

## 2018-09-30 NOTE — Progress Notes (Signed)
Patient ID: [name], male   DOB: 1946/06/14, 73 y.o.   MRN: [id number]   [facility]  PCP: Dr. Bradd Fletcher Cardiology: Dr. Sallyanne Fletcher HF Cardiology: Dr. Aundra Fletcher Nephrologist: Dr. Merlene Morse Fletcher is a 73 y.o. 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   He was admitted in 11/18 with acute respiratory failure => intubated.  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  He was treated for acute on chronic diastolic CHF with IV diuresis.    Patient was admitted in 1/20 with acute on chronic diastolic CHF.  He was found to have severe aortic stenosis.  He was diuresed and developed AKI.  He was seen by Dr. Burt Fletcher for TAVR evaluation, TAVR CTs could not be done because of AKI.    He presents today for followup of CHF.  He is now in a SNF.  Using home oxygen and CPAP at night.  He has a chronic cough.  Not walking much, gets short of breath after walking 30-40 feet.  Chest feels tight when fluid builds up, not much currently.  Legs chronically swollen.  He is taking a high dose of torsemide + metolazone every other day.    Labs (11/18): hgb 14.8, K 4.6, creatinine 2.34 Labs (08/17/2017): K 3.5 Creatinine 2.16  Labs (11/29/2017): K 3.4 Creatinine 2.52  Labs (6/19): K 3.6, creatinine 2.29 Labs (9/19): K 4.3, creatinine 1.93 Labs (1/20): K 4.4, creatinine 2.2, BUN 72  ECG (personally reviewed): Difficult, but may be NSR with low voltage P waves  PMH: 1. Chronic diastolic CHF with RV dysfunction: Echo (2/15) with EF 60%, mild LVH, mild aortic stenosis with mean gradient 18 mmHg, RV mildly dilated with mildly decreased systolic function.  - Echo (10/18): EF 60-65%, moderate aortic stenosis.  - Echo (8/19): EF 60-65%, mild LVH, moderate AS with mean gradient 29 mmHg/AVA 1.1 cm^2, PASP 45, normal RV size with mildly  decreased systolic function.  - TEE (1/20): EF 55-60%, moderate LVH, AVA 0.5 cm^2 by planimetry with mean gradient 35 mmHg, AVA 1.0 cm^2 continuity equation.  - RHC (1/20): mean RA 15, PA 68/23 mean 44, mean PCWP 20, CI 3.8, PVR 3 WU 2. Aortic stenosis: Suspect severe at this point.  - TEE (1/20): EF 55-60%, moderate LVH, AVA 0.5 cm^2 by planimetry with mean gradient 35 mmHg, AVA 1.0 cm^2 continuity equation.   3. Type II diabetes 4. 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  - 3.8 cm ascending aorta on 8/19 echo.  5. OHS/OSA: Uses CPAP and oxygen during the day.  6. Morbid obesity.  7. HTN 8. CAD: Nonobstructive.  [name] 2005 with 60% LAD stenosis. 9. 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  10. Atrial fibrillation: Paroxysmal.  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  11. CKD stage III 12. Macular degeneration: Legally blind.  13. 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  14. 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 15. S/p left foot amputation 16. Diabetic peripheral neuropathy.  68. H/o atrial tachycardia  Social History   Socioeconomic History  . Marital status: Divorced    Spouse name: Not on file  . Number of children: 4  . Years of education: 68  . Highest education level: 12th grade  Occupational History  . Occupation: disabled  Social Needs  . Financial resource strain:  Somewhat hard  . Food insecurity:    Worry: Sometimes true    Inability: Sometimes true  . Transportation needs:    Medical: No    Non-medical: Yes  Tobacco Use  . Smoking status: Former Smoker    Packs/day: 0.00    Years: 0.00    Pack years: 0.00    Types: Cigarettes    Last attempt to quit: 08/17/2003    Years since quitting: 15.1  . Smokeless tobacco: Never Used  . Tobacco comment: started at age 32.  only smoked on occ- very rarely.0  Substance and Sexual Activity  . Alcohol use: Yes    Comment: 06/23/2013 "drank some; never had  a problem w/it"  . Drug use: No  . Sexual activity: Never  Lifestyle  . Physical activity:    Days per week: Not on file    Minutes per session: Not on file  . Stress: Not on file  Relationships  . Social connections:    Talks on phone: Not on file    Gets together: Not on file    Attends religious service: Not on file    Active member of club or organization: Not on file    Attends meetings of clubs or organizations: Not on file    Relationship status: Not on file  . Intimate partner violence:    Fear of current or ex partner: Not on file    Emotionally abused: Not on file    Physically abused: Not on file    Forced sexual activity: Not on file  Other Topics Concern  . Not on file  Social History Narrative   12/10- reports issues with housing- states it is not disability friendly but has ramp, first floor apt, and has grab bars/assistive devices provided by [facility].  Reports that he has concerns with having enough food but gets meals on wheels, eats at [facility] 3x a week, and has personal aid who cooks and shops for him.  Reports concerns with transportation but gets [facility] transport for all of his medical appts and his family or an aid helps him with other transportation needs.    Family History  Problem Relation Age of Onset  . Coronary artery disease Sister   . Heart disease Sister   . Allergies Sister   . Asthma Brother   . Rheum arthritis Brother    Review of systems complete and found to be negative unless listed in HPI.    Current Outpatient Medications  Medication Sig Dispense Refill  . acetaminophen (TYLENOL) 650 MG CR tablet Take 1,300 mg by mouth 2 (two) times daily.     Marland Kitchen albuterol (PROVENTIL HFA;VENTOLIN HFA) 108 (90 Base) MCG/ACT inhaler Inhale 2 puffs into the lungs every 6 (six) hours as needed for wheezing or shortness of breath.    Marland Kitchen amLODipine (NORVASC) 10 MG tablet Take 10 mg by mouth daily. Reported on 12/09/2015    . antiseptic oral rinse (BIOTENE) LIQD 15 mLs  by Mouth Rinse route 3 (three) times daily.    Marland Kitchen aspirin 81 MG tablet Take 81 mg by mouth daily.     Marland Kitchen atorvastatin (LIPITOR) 40 MG tablet Take 1 tablet (40 mg total) by mouth daily. 90 tablet 3  . cholecalciferol (VITAMIN D) 1000 units tablet Take 1,000 Units by mouth daily.    . citalopram (CELEXA) 20 MG tablet Take 20 mg by mouth daily.    . famotidine (PEPCID) 20 MG tablet Take 20 mg by mouth daily.    Marland Kitchen  Febuxostat (ULORIC) 80 MG TABS Take 80 mg by mouth daily.     Marland Kitchen gabapentin (NEURONTIN) 300 MG capsule Take 300 mg by mouth 2 (two) times daily.    . hydrALAZINE (APRESOLINE) 50 MG tablet Take 1 tablet (50 mg total) 3 (three) times daily by mouth. 90 tablet 0  . insulin glargine (LANTUS) 100 UNIT/ML injection Inject 0.25 mLs (25 Units total) into the skin at bedtime.    . lidocaine (LIDODERM) 5 % Place 1 patch onto the skin daily as needed (back pain). Remove & Discard patch within 12 hours or as directed by MD    . Liraglutide (VICTOZA) 18 MG/3ML SOPN Inject 1.8 mg into the skin daily.    . Melatonin 3 MG CAPS Take 1 capsule by mouth at bedtime as needed (sleep).     . Menthol, Topical Analgesic, (BIOFREEZE EX) Apply 1 application topically 3 (three) times daily as needed (pain). Reported on 10/27/2015    . methylcellulose (ARTIFICIAL TEARS) 1 % ophthalmic solution Place 1 drop into both eyes 2 (two) times daily as needed (dry eyes).     . metolazone (ZAROXOLYN) 2.5 MG tablet Take 1 tablet (2.5 mg total) by mouth every other day.    . metoprolol succinate (TOPROL-XL) 50 MG 24 hr tablet Take 1 tablet (50 mg total) by mouth daily. Take with or immediately following a meal. 30 tablet 3  . polyethylene glycol (MIRALAX / GLYCOLAX) packet Take 17 g by mouth daily as needed for moderate constipation.    . potassium chloride SA (K-DUR,KLOR-CON) 20 MEQ tablet Take 2 tablets (40 mEq total) by mouth 2 (two) times daily.    . sennosides-docusate sodium (SENOKOT-S) 8.6-50 MG tablet Take 2 tablets by mouth 2  (two) times daily.     . Skin Protectants, Misc. (EUCERIN) cream Apply 1 application topically 2 (two) times daily.    Marland Kitchen torsemide (DEMADEX) 20 MG tablet Take 6 tablets (120 mg total) by mouth 2 (two) times daily.     No current facility-administered medications for this encounter.    Facility-Administered Medications Ordered in Other Encounters  Medication Dose Route Frequency Provider Last Rate Last Dose  . chlorhexidine gluconate (MEDLINE KIT) (PERIDEX) 0.12 % solution 15 mL  15 mL Mouth Rinse BID Chesley Mires, MD      . MEDLINE mouth rinse  15 mL Mouth Rinse QID Chesley Mires, MD       Vitals:   09/28/18 1145  BP: (!) 148/76  Pulse: 76  SpO2: 93%  Weight: (!) 153.5 kg (338 lb 6.4 oz)    Wt Readings from Last 3 Encounters:  09/28/18 (!) 153.5 kg (338 lb 6.4 oz)  09/08/18 (!) 144.2 kg (317 lb 14.4 oz)  07/25/18 (!) 151.1 kg (333 lb 3.2 oz)    Physical Exam General: NAD Neck: Thick, no JVD, no thyromegaly or thyroid nodule.  Lungs: Clear to auscultation bilaterally with normal respiratory effort. CV: Nondisplaced PMI.  Heart regular S1/S2, no K0/U5, 3/6 systolic crescendo-decrescendo murmur RUSB with muffled S2.  Legs wrapped with 1+ chronic edema to knees.  No carotid bruit. Unable to palpate pedal pulses.  Abdomen: Soft, nontender, no hepatosplenomegaly, no distention.  Skin: Intact without lesions or rashes.  Neurologic: Alert and oriented x 3.  Psych: Normal affect. Extremities: No clubbing or cyanosis.  HEENT: Normal.   Assessment/Plan:  1. Chronic diastolic CHF with prominent RV failure: Volume difficult to manage in setting of RV failure (OHS/OSA) and severe AS.  NYHA class IIIb-IV symptoms.  He is not markedly volume overloaded today but exam is difficult.  - Continue torsemide 120 mg BID - Continue metolazone 2.5 mg every other day.  - He is on a very aggressive diuretic regimen.  He may eventually require hemodialysis for effective volume control.  - BMET today.    2. Aortic stenosis:  Now appears severe by 1/20 TEE.  He is a very difficult TAVR candidate with OHS/OSA, significant diastolic failure, and CKD.  He did not have scans while in the hospital due to elevated creatinine. We may never be able to get him to TAVR.  -  Will get BMET today.  If creatinine trending down, will send back to Dr. Burt Fletcher for evaluation.  3. Atrial fibrillation:  He appears to be in sinus today with very low voltage Ps (regular rhythm).  He is not anticoagulated (takes ASA) due to prior intracerebral hemorrhage. 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   - As he is not anticoagulated, would continue rate control.  Cannot do DCCV without anticoagulation.  - Continue Toprol XL 50 mg daily.  4. CAD: Nonobstructive.  No chest pain.  - Continue ASA and statin.   5. 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 CKD: Stage III.   - BMET today.   7. OHS/OSA: Continue nightly CPAP and home oxygen.  8. Ascending aorta aneurysm: 4.0 cm on 11/18 CT chest.  8/19 echo with 3.8 cm ascending aorta.  Followup in 10 days with APP.     Loralie Champagne, MD  09/30/2018   Addendum:  Creatinine is improving, down to 1.8.  Will send him back to Dr. Burt Fletcher for evaluation as we may have a window to treat his AS.    Loralie Champagne 09/30/2018

## 2018-10-02 ENCOUNTER — Other Ambulatory Visit (HOSPITAL_COMMUNITY): Payer: Self-pay

## 2018-10-02 ENCOUNTER — Telehealth (HOSPITAL_COMMUNITY): Payer: Self-pay

## 2018-10-02 DIAGNOSIS — I350 Nonrheumatic aortic (valve) stenosis: Secondary | ICD-10-CM

## 2018-10-02 NOTE — Telephone Encounter (Signed)
-----   Message from Larey Dresser, MD sent at 09/30/2018  5:03 PM EST ----- Creatinine better, refer him to Dr. Burt Knack for TAVR evaluation.

## 2018-10-02 NOTE — Progress Notes (Signed)
Opened in error

## 2018-10-02 NOTE — Telephone Encounter (Signed)
Creatinine improved, per Dr Haroldine Laws, pt to be referred back to Dr. Burt Knack for TAVR. Pt called, no answer, no VM set up.  Will attempt call again.  Referral placed for Dr. Burt Knack

## 2018-10-09 ENCOUNTER — Telehealth: Payer: Self-pay

## 2018-10-09 NOTE — Telephone Encounter (Signed)
I contacted the pt last week to schedule a follow-up appointment with Dr [name] per the CHF clinic.  The pt said that Dr [name] will be managing all of his medications and care and he did not need to make any further appointments.  I asked the pt if I could contact [name] and he agreed.  I left [name] a voicemail last week and I was able to speak with her by phone today.  Per [name] the pt has decided to have all his care managed by PCP Dr [name].  I advised [name] that the pt has an apt scheduled with the CHF clinic tomorrow.  [name] was unaware of this apt and plans to contact the pt tonight and encourage him to keep this apt so that a team of specialist can manage his heart failure.  [name] was very appreciative of the call and at this time we will not arrange apt with Dr [name] per the pt's wishes.

## 2018-10-10 ENCOUNTER — Encounter (HOSPITAL_COMMUNITY): Payer: Medicare (Managed Care)

## 2018-10-10 NOTE — Telephone Encounter (Signed)
I followed up to see if the pt came in for CHF apt today. The apt was cancelled and this was the cancellation note in Epic:  Ria Comment with [facility] called and requested to cancel this appt as the PT's PCP wants to give him a break.   I spoke with Dr Burt Knack and at this time we will remove the pt from our active lists of TAVR consults.

## 2018-11-08 ENCOUNTER — Other Ambulatory Visit (HOSPITAL_COMMUNITY): Payer: Medicare (Managed Care)

## 2018-11-18 LAB — BASIC METABOLIC PANEL
BUN: 77 — AB (ref 4–21)
Creatinine: 3 — AB (ref 0.6–1.3)
Glucose: 449
Potassium: 3.2 — AB (ref 3.4–5.3)
Sodium: 131 — AB (ref 137–147)

## 2018-11-27 ENCOUNTER — Other Ambulatory Visit (HOSPITAL_COMMUNITY): Payer: Medicare (Managed Care)

## 2018-11-28 LAB — BASIC METABOLIC PANEL
BUN: 76 — AB (ref 4–21)
Creatinine: 3.1 — AB (ref 0.6–1.3)
Glucose: 539
Potassium: 3.4 (ref 3.4–5.3)
Sodium: 132 — AB (ref 137–147)

## 2018-11-30 ENCOUNTER — Telehealth: Payer: Self-pay | Admitting: Internal Medicine

## 2018-11-30 NOTE — Telephone Encounter (Signed)
Chart reviewed Pt seen by Einar Crow    Would reschedule echo when restrictions lifted for corona virus   Recomm June/July 2020

## 2018-12-04 ENCOUNTER — Ambulatory Visit (HOSPITAL_COMMUNITY): Payer: Medicare (Managed Care)

## 2018-12-04 ENCOUNTER — Telehealth: Payer: Self-pay

## 2018-12-04 NOTE — Telephone Encounter (Signed)
Called [facility] to coordinate with someone for the patient's upcoming virtual visit, was transferred to patient's nurse Kennyth Lose. The nurse informed me that they did not have the appointment on their books and ask how the visit would work out due to the patient is blind, some what hard of hearing and in a nursing home. I asked what nursing home the patient was in. Patient is a resident of [facility]. The nurse asked I keep her informed of the updates

## 2018-12-04 NOTE — Telephone Encounter (Signed)
Called the SNF- [facility], spoke with the Social Worker Lelon Frohlich and explained to her the reason for my call. She was made aware of the patient having a virtual visit scheduled for Wednesday 12/06/2018 at  [facility] and what his nurse from [facility] told me about he needing someone to be with him due to him being blind and somewhat hard of hearing. The Social Worker said that his nurse can be in the room with him but the time needed to be changed due to the monring not being a great time for the nurse. I informed her of a time slot of 2PM being available she stated that would be fine and that she will inform his nurse so that the cordless phone can be near the nurses station for the call.

## 2018-12-06 ENCOUNTER — Telehealth: Payer: Medicare (Managed Care) | Admitting: Cardiovascular Disease

## 2019-01-19 ENCOUNTER — Encounter (HOSPITAL_COMMUNITY): Payer: Self-pay

## 2019-01-19 ENCOUNTER — Ambulatory Visit (HOSPITAL_COMMUNITY)
Admission: RE | Admit: 2019-01-19 | Discharge: 2019-01-19 | Disposition: A | Discharge status: Home / Self Care | Payer: Medicaid Other | Source: Ambulatory Visit | Attending: Adult Health | Admitting: Adult Health

## 2019-01-19 ENCOUNTER — Telehealth (HOSPITAL_COMMUNITY): Payer: Self-pay

## 2019-01-19 ENCOUNTER — Other Ambulatory Visit: Payer: Self-pay

## 2019-01-19 ENCOUNTER — Other Ambulatory Visit (HOSPITAL_COMMUNITY): Payer: Self-pay

## 2019-01-19 VITALS — BP 114/62 | HR 62 | Temp 97.6°F

## 2019-01-19 DIAGNOSIS — I4819 Other persistent atrial fibrillation: Secondary | ICD-10-CM

## 2019-01-19 DIAGNOSIS — I350 Nonrheumatic aortic (valve) stenosis: Secondary | ICD-10-CM

## 2019-01-19 DIAGNOSIS — I5032 Chronic diastolic (congestive) heart failure: Secondary | ICD-10-CM | POA: Diagnosis not present

## 2019-01-19 MED ORDER — POTASSIUM CHLORIDE CRYS ER 20 MEQ PO TBCR: 100.0000 meq | EXTENDED_RELEASE_TABLET | Freq: Every day | ORAL | Status: DC

## 2019-01-19 MED ORDER — ASPIRIN EC 81 MG PO TBEC: 81.0000 mg | DELAYED_RELEASE_TABLET | Freq: Every day | ORAL | 3 refills | Status: AC

## 2019-01-19 MED ORDER — ATORVASTATIN CALCIUM 40 MG PO TABS: 40.0000 mg | ORAL_TABLET | Freq: Every day | ORAL | 3 refills | Status: DC

## 2019-01-19 NOTE — Telephone Encounter (Signed)
Reviewed AVS:  Cut back potassium 100 meq daily, restart 81 mg asa daily, and 40 mg atorvastatin daily.  Call [facility] and give his nurse the above changeds [phone] ask for [facility]. Damaris Schooner with [facility], she requested med changes be faxed to [place] call [facility] for copy of lab work. Phill Myron, awaiting fax  Recommended follow-up: Follow up 3-4 months wit Dr Martyn Malay /routed to [facility] to sched

## 2019-01-19 NOTE — Progress Notes (Signed)
Heart Failure TeleHealth Note  Due to national recommendations of social distancing due to [name] 19, Audio/video telehealth visit is felt to be most appropriate for this patient at this time.  See MyChart message from today for patient consent regarding telehealth for [name].  Date:  01/19/2019   ID:  [name], DOB 29-May-1946, MRN [id number]  Location: Home  Provider location:  Advanced Heart Failure Type of Visit: Established patient   PCP:  Ryan Adie, MD  Cardiologist:  No primary care provider on file. Primary HF: Dr [name] [facility]ACE   Chief Complaint: Heart Failure   History of Present Illness: [name] is a 74 y.o. male legally blind, with a history chronic diastolic CHF complicated by RV dysfunction, morbid obesity, CKD stage IV, symptomatic sinus bradycardia s/p PPM.   Patient was admitted in 1/20 with acute on chronic diastolic CHF.  He was found to have severe aortic stenosis.  He was diuresed and developed AKI.  He was seen by [name] for TAVR evaluation, TAVR CTs could not be done because of AKI.  He was discharged to [facility].   He presents via Administrator for a telehealth visit today with HF Paramedicine.  Overall feeling fine. Mild dyspnea in the house but he does not move around much. Denies PND/Orthopnea. Using oxygen/CPAP at night. Appetite ok. He is no longer eating pickles. No fever or chills. Unable to weight today.  Taking all medications. He has aides 7 days a week.  Followed by HF Paramedicine.   he denies symptoms worrisome for COVID 19.   PMH: 1. Chronic diastolic CHF with RV dysfunction: Echo (2/15) with EF 60%, mild LVH, mild aortic stenosis with mean gradient 18 mmHg, RV mildly dilated with mildly decreased systolic function.  - Echo (10/18): EF 60-65%, moderate aortic stenosis.  - Echo (8/19): EF 60-65%, mild LVH, moderate AS with mean gradient 29 mmHg/AVA 1.1 cm^2, PASP 45, normal RV size with mildly  decreased systolic function.  - TEE (1/20): EF 55-60%, moderate LVH, AVA 0.5 cm^2 by planimetry with mean gradient 35 mmHg, AVA 1.0 cm^2 continuity equation.  - RHC (1/20): mean RA 15, PA 68/23 mean 44, mean PCWP 20, CI 3.8, PVR 3 WU 2. Aortic stenosis: Suspect severe at this point.  - TEE (1/20): EF 55-60%, moderate LVH, AVA 0.5 cm^2 by planimetry with mean gradient 35 mmHg, AVA 1.0 cm^2 continuity equation.   3. Type II diabetes 4. 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  - 3.8 cm ascending aorta on 8/19 echo.  5. OHS/OSA: Uses CPAP and oxygen during the day.  6. Morbid obesity.  7. HTN 8. CAD: Nonobstructive.  [place] 2005 with 60% LAD stenosis. 9. 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  10. Atrial fibrillation: Paroxysmal.  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  11. CKD stage III 12. Macular degeneration: Legally blind.  13. 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  14. 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 15. S/p left foot amputation 16. Diabetic peripheral neuropathy.  96. H/o atrial tachycardia   Past Medical History:  Diagnosis Date  . Arthritis    "all over" (06/23/2013)  . Atrial fibrillation ([place])    not Candidate for Coumadin d/t rectal bleeding  . Benign prostatic hypertrophy   . Cardiomyopathy, hypertensive ([place])     diastolic dysfunction by 5916 echo  . Coronary artery disease     nonobstructive. 2 vessel.  cardiac catheter in 2003/11/21 showing 60% LAD occlusion, 30% circumflex occlusion.  . Degenerative joint disease     Right knee.  . Diabetes type 2, uncontrolled ([place])   . Diabetic peripheral neuropathy ([place])   . Diabetic ulcer of thigh ([place])    H/O Rt thigh ulcer.  . Exertional shortness of breath    "comes and goes" (2013-07-22)  . GERD (gastroesophageal reflux disease)   . Gout   . Herpes   . Hyperlipidemia   . Hypertension   . Hypotension 09/05/2013  . Incarcerated ventral hernia     H/O. S/P repair ventral hernia repair 07/2005.  Marland Kitchen LBBB (left bundle branch block)   . Legally blind   . Morbid obesity ([place])   . Obstructive sleep apnea    On CPAP. Last sleep study (06/2009) - Severe obstructive sleep apnea/hypopnea syndrome, apnea-hypopnea index 70.5 per hour with non positional events moderately loud snoring and oxygen desaturation to a nadir of 85% on room air.  . Occult blood positive stool     History of in August 2007.  colonoscopy in January 2008 showing normal colon with fair inadequate prep. By Dr. Silvano Fletcher.  . 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 Sepsis ([place]) 08/26/2013   Past Surgical History:  Procedure Laterality Date  . AMPUTATION Left 06/22/2013   Procedure: AMPUTATION FOOT;  Surgeon: Ryan Minion, MD;  Location: [place];  Service: Orthopedics;  Laterality: Left;  Left Midfoot Amputation  . CARDIAC CATHETERIZATION  10/22/2003   Dilatation of the LV with preserved LV systolic function. Tortous aorta, but no AAA. Moderate disease with 50-60% area of narrowing in the circumflex after the second OM and 70% mid narrowing in the LAD. No intervention.  Marland Kitchen CARDIOVASCULAR STRESS TEST  01/23/2010   Perfusion defect seen in inferior myocardial region is consistent with diaphragmatic attenuation. Remaining myocardium demonstrates normal myocardial perfusion with no evidence of ischemia or infarct. Stress ECG was non-diagnostic due to LBBB.  Marland Kitchen CATARACT EXTRACTION, BILATERAL Bilateral   . ESOPHAGOGASTRODUODENOSCOPY N/A 10/12/2013   Procedure: ESOPHAGOGASTRODUODENOSCOPY (EGD);  Surgeon: Ryan Ober, MD;  Location: [facility];  Service: General;  Laterality: N/A;  bedside  . EYE SURGERY    . 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 HERNIA REPAIR    . INSERT / REPLACE / REMOVE PACEMAKER  21-Nov-2003   Secondary to symptomatic bradycardia. DDDR pacemaker. By Dr. Rollene Fletcher.  Randolm Idol / REPLACE / REMOVE PACEMAKER  11/20/12   "old  pacemaker died; had new one put in" (07-22-2013)  . Pacemaker lead revision  12/2003    likely microdislodgment  of right ventricular lead  . PEG PLACEMENT N/A 10/12/2013   Procedure: PERCUTANEOUS ENDOSCOPIC GASTROSTOMY (PEG) PLACEMENT;  Surgeon: Ryan Ober, MD;  Location: [place];  Service: General;  Laterality: N/A;  . PERMANENT PACEMAKER GENERATOR CHANGE N/A 11/03/2012   Procedure: PERMANENT PACEMAKER GENERATOR CHANGE;  Surgeon: Sanda Klein, MD;  Location: [facility] CATH LAB;  Service: Cardiovascular;  Laterality: N/A;  . REFRACTIVE SURGERY Bilateral   . RIGHT HEART CATH N/A 09/05/2018   Procedure: RIGHT HEART CATH;  Surgeon: Larey Dresser, MD;  Location: [facility] CV LAB;  Service: Cardiovascular;  Laterality: N/A;  . TEE WITHOUT CARDIOVERSION N/A 09/05/2018   Procedure: TRANSESOPHAGEAL ECHOCARDIOGRAM (TEE);  Surgeon: Larey Dresser, MD;  [facility] END[facility];  Service: Cardiovascular;  Laterality: N/A;  . TRANSTHORACIC ECHOCARDIOGRAM  01/01/2013   EF 50-55%, ventricular septum-thickness moderately  increased  . VENOUS DOPPLER  01/17/2013   No evidence of thrombus or thrombophlebitis. Left GSVs appear enlarged and demonstrate valvular insuffiiciency. Bilateral SSVs appeared patent with no valvular insufficiency seen.  Marland Kitchen VENTRAL HERNIA REPAIR  07/2005    S/P laparotomy, lysis of adhesions, repair of incarcerated ventral hernia with  partial omentectomy.. Performed by Dr. Marlou Starks.     Current Outpatient Medications  Medication Sig Dispense Refill  . acetaminophen (TYLENOL) 650 MG CR tablet Take 1,300 mg by mouth 2 (two) times daily.     Marland Kitchen albuterol (PROVENTIL HFA;VENTOLIN HFA) 108 (90 Base) MCG/ACT inhaler Inhale 2 puffs into the lungs every 6 (six) hours as needed for wheezing or shortness of breath.    Marland Kitchen amLODipine (NORVASC) 10 MG tablet Take 10 mg by mouth daily. Reported on 12/09/2015    . antiseptic oral rinse (BIOTENE) LIQD 15 mLs by Mouth Rinse route 3 (three) times daily.    Marland Kitchen  aspirin 81 MG tablet Take 81 mg by mouth daily.     . cholecalciferol (VITAMIN D) 1000 units tablet Take 1,000 Units by mouth daily.    . citalopram (CELEXA) 20 MG tablet Take 20 mg by mouth daily.    . famotidine (PEPCID) 20 MG tablet Take 20 mg by mouth daily.    . Febuxostat (ULORIC) 80 MG TABS Take 80 mg by mouth daily.     Marland Kitchen gabapentin (NEURONTIN) 300 MG capsule Take 300 mg by mouth 2 (two) times daily.    . hydrALAZINE (APRESOLINE) 50 MG tablet Take 1 tablet (50 mg total) 3 (three) times daily by mouth. 90 tablet 0  . insulin glargine (LANTUS) 100 UNIT/ML injection Inject 0.25 mLs (25 Units total) into the skin at bedtime.    . lidocaine (LIDODERM) 5 % Place 1 patch onto the skin daily as needed (back pain). Remove & Discard patch within 12 hours or as directed by MD    . Liraglutide (VICTOZA) 18 MG/3ML SOPN Inject 1.8 mg into the skin daily.    . Melatonin 3 MG CAPS Take 1 capsule by mouth at bedtime as needed (sleep).     . Menthol, Topical Analgesic, (BIOFREEZE EX) Apply 1 application topically 3 (three) times daily as needed (pain). Reported on 10/27/2015    . methylcellulose (ARTIFICIAL TEARS) 1 % ophthalmic solution Place 1 drop into both eyes 2 (two) times daily as needed (dry eyes).     . metolazone (ZAROXOLYN) 2.5 MG tablet Take 1 tablet (2.5 mg total) by mouth every other day. (Patient taking differently: Take 1.25 mg by mouth every Monday, Wednesday, and Friday. )    . metoprolol succinate (TOPROL-XL) 50 MG 24 hr tablet Take 1 tablet (50 mg total) by mouth daily. Take with or immediately following a meal. 30 tablet 3  . polyethylene glycol (MIRALAX / GLYCOLAX) packet Take 17 g by mouth daily as needed for moderate constipation.    . potassium chloride SA (K-DUR,KLOR-CON) 20 MEQ tablet Take 2 tablets (40 mEq total) by mouth 2 (two) times daily. (Patient taking differently: Take 100 mEq by mouth 2 (two) times daily. )    . sennosides-docusate sodium (SENOKOT-S) 8.6-50 MG tablet Take 2  tablets by mouth 2 (two) times daily.     . Skin Protectants, Misc. (EUCERIN) cream Apply 1 application topically 2 (two) times daily.    Marland Kitchen torsemide (DEMADEX) 20 MG tablet Take 6 tablets (120 mg total) by mouth 2 (two) times daily.    Marland Kitchen atorvastatin (LIPITOR) 40 MG tablet  Take 1 tablet (40 mg total) by mouth daily. (Patient not taking: Reported on 01/19/2019) 90 tablet 3   No current facility-administered medications for this encounter.    Facility-Administered Medications Ordered in Other Encounters  Medication Dose Route Frequency Provider Last Rate Last Dose  . chlorhexidine gluconate (MEDLINE KIT) (PERIDEX) 0.12 % solution 15 mL  15 mL Mouth Rinse BID Chesley Mires, MD      . MEDLINE mouth rinse  15 mL Mouth Rinse QID Chesley Mires, MD        Allergies:   Patient has no known allergies.   Social History:  The patient  reports that he quit smoking about 15 years ago. His smoking use included cigarettes. He smoked 0.00 packs per day for 0.00 years. He has never used smokeless tobacco. He reports current alcohol use. He reports that he does not use drugs.   Family History:  The patient's family history includes Allergies in his sister; Asthma in his brother; Coronary artery disease in his sister; Heart disease in his sister; Rheum arthritis in his brother.   ROS:  Please see the history of present illness.   All other systems are personally reviewed and negative.   Exam:  ([place] [facility]; Exam is subjective General:  Speaks in full sentences. No resp difficulty. Lungs: Normal respiratory effort with conversation.  Abdomen: Obese, distended per patient report Extremities: Pt denies edema. LLE 1+ edema Neuro: Alert & oriented x 3.   Recent Labs: 08/31/2018: B Natriuretic Peptide 151.0 09/15/2018: Hemoglobin 11.8; Platelets 223 11/28/2018: BUN 76; Creatinine 3.1; Potassium 3.4; Sodium 132  Personally reviewed   Wt Readings from Last 3 Encounters:  09/28/18 (!) 153.5 kg (338 lb 6.4 oz)   09/08/18 (!) 144.2 kg (317 lb 14.4 oz)  07/25/18 (!) 151.1 kg (333 lb 3.2 oz)      ASSESSMENT AND PLAN:  1. Chronic diastolic CHF with prominent RV failure: Volume difficult to manage in setting of RV failure (OHS/OSA) and severe AS.   -NYHA II-III but difficult to evaluate function class due to  but limited  - Continue torsemide 120 mg BID - Restarting metolazone 2.5 mg every M-W-F per Nephrology.   He may eventually require hemodialysis for effective volume control.  2. Aortic stenosis:  Now appears severe by 1/20 TEE.  He is a very difficult TAVR candidate with OHS/OSA, significant diastolic failure, and CKD.  He did not have scans while in the hospital due to elevated creatinine. We may never be able to get him to TAVR.  - Unable to pursue TAVR with creatinine >3.   - He has had recent blood work at Nephrology. We will request a copy.   3. Atrial fibrillation:   He is not anticoagulated (takes ASA) due to prior intracerebral hemorrhage. 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   - As he is not anticoagulated, would continue rate control.   - Continue Toprol XL 50 mg daily.  4. CAD: Nonobstructive.  No chest pain.  - Restart  ASA 81 mg daily and atorvastatin today.   5. 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 CKD: Stage IV - I reviewed BMET on 11/2018. He had labs at [facility] this week. I will call for a copy.   7. OHS/OSA: Continue CPAP and oxygen.  8. Ascending aorta aneurysm: 4.0 cm on 11/18 CT chest.  8/19 echo with 3.8 cm ascending aorta.   COVID screen The patient does not have any symptoms that  suggest any further testing/ screening at this time.  Social distancing reinforced today.  Patient Risk: After full review of this patients clinical status, I feel that they are at moderate risk for cardiac decompensation at this time.  Relevant cardiac medications were reviewed at length with the patient today. The patient does not have  concerns regarding their medications at this time.   The following changes were made today: Cut back potassium 100 meq daily, restart 81 mg asa daily, and 40 mg atorvastatin daily.   Recommended follow-up:  Follow up 3 months   Today, I have spent 20  minutes with the patient with telehealth technology discussing the above issues .    Jeanmarie Hubert, NP  01/19/2019 11:02 AM  [place] [address] [phone] (office) [phone] (fax)

## 2019-01-19 NOTE — Addendum Note (Signed)
Encounter addended by: Jovita Kussmaul, RN on: 01/19/2019 11:35 AM  Actions taken: Pharmacy for encounter modified, Order list changed

## 2019-01-19 NOTE — Progress Notes (Signed)
Paramedicine Encounter   Patient ID: [name] , male,   DOB: 1945/12/28,72 y.o.,  MRN: [id number]   Met patient in his house for a virtual visit today with provider Amy.   Pt stated that he feels so/so today. He denies sob, chest pain and dizziness. He's only wearing oxygen at night with the CPAP.  He has an aide that comes 7 days a week for a couple of hours. He had an appointment the kidney doctor earlier this week. They told him that his kidney function is better than before.  Amy told pt to continue what he's doing and she will see him in 3 months.   These meds are inconsistent with the meds on his chart in Epic;Amy is aware.  metalozone m w f 1.5 half tab vial t th s s for fluid retention Not taking atorvasatin Not taking asa  Time spent with patient [place], [place] 01/19/2019   ACTION: Home visit completed

## 2019-01-24 IMAGING — CR DG CHEST 2V
2 series · 2 of 2 positions shown · non-contrast
Comparison: Chest x-ray of August 20, 2015

CLINICAL DATA: Several weeks of cough, shortness of breath. History
of CHF, cardiomyopathy, coronary artery disease, hypertension,
hyperlipidemia, diabetes; discontinued smoking 12 years ago.

EXAM:
CHEST  2 VIEW

[w chest pa]
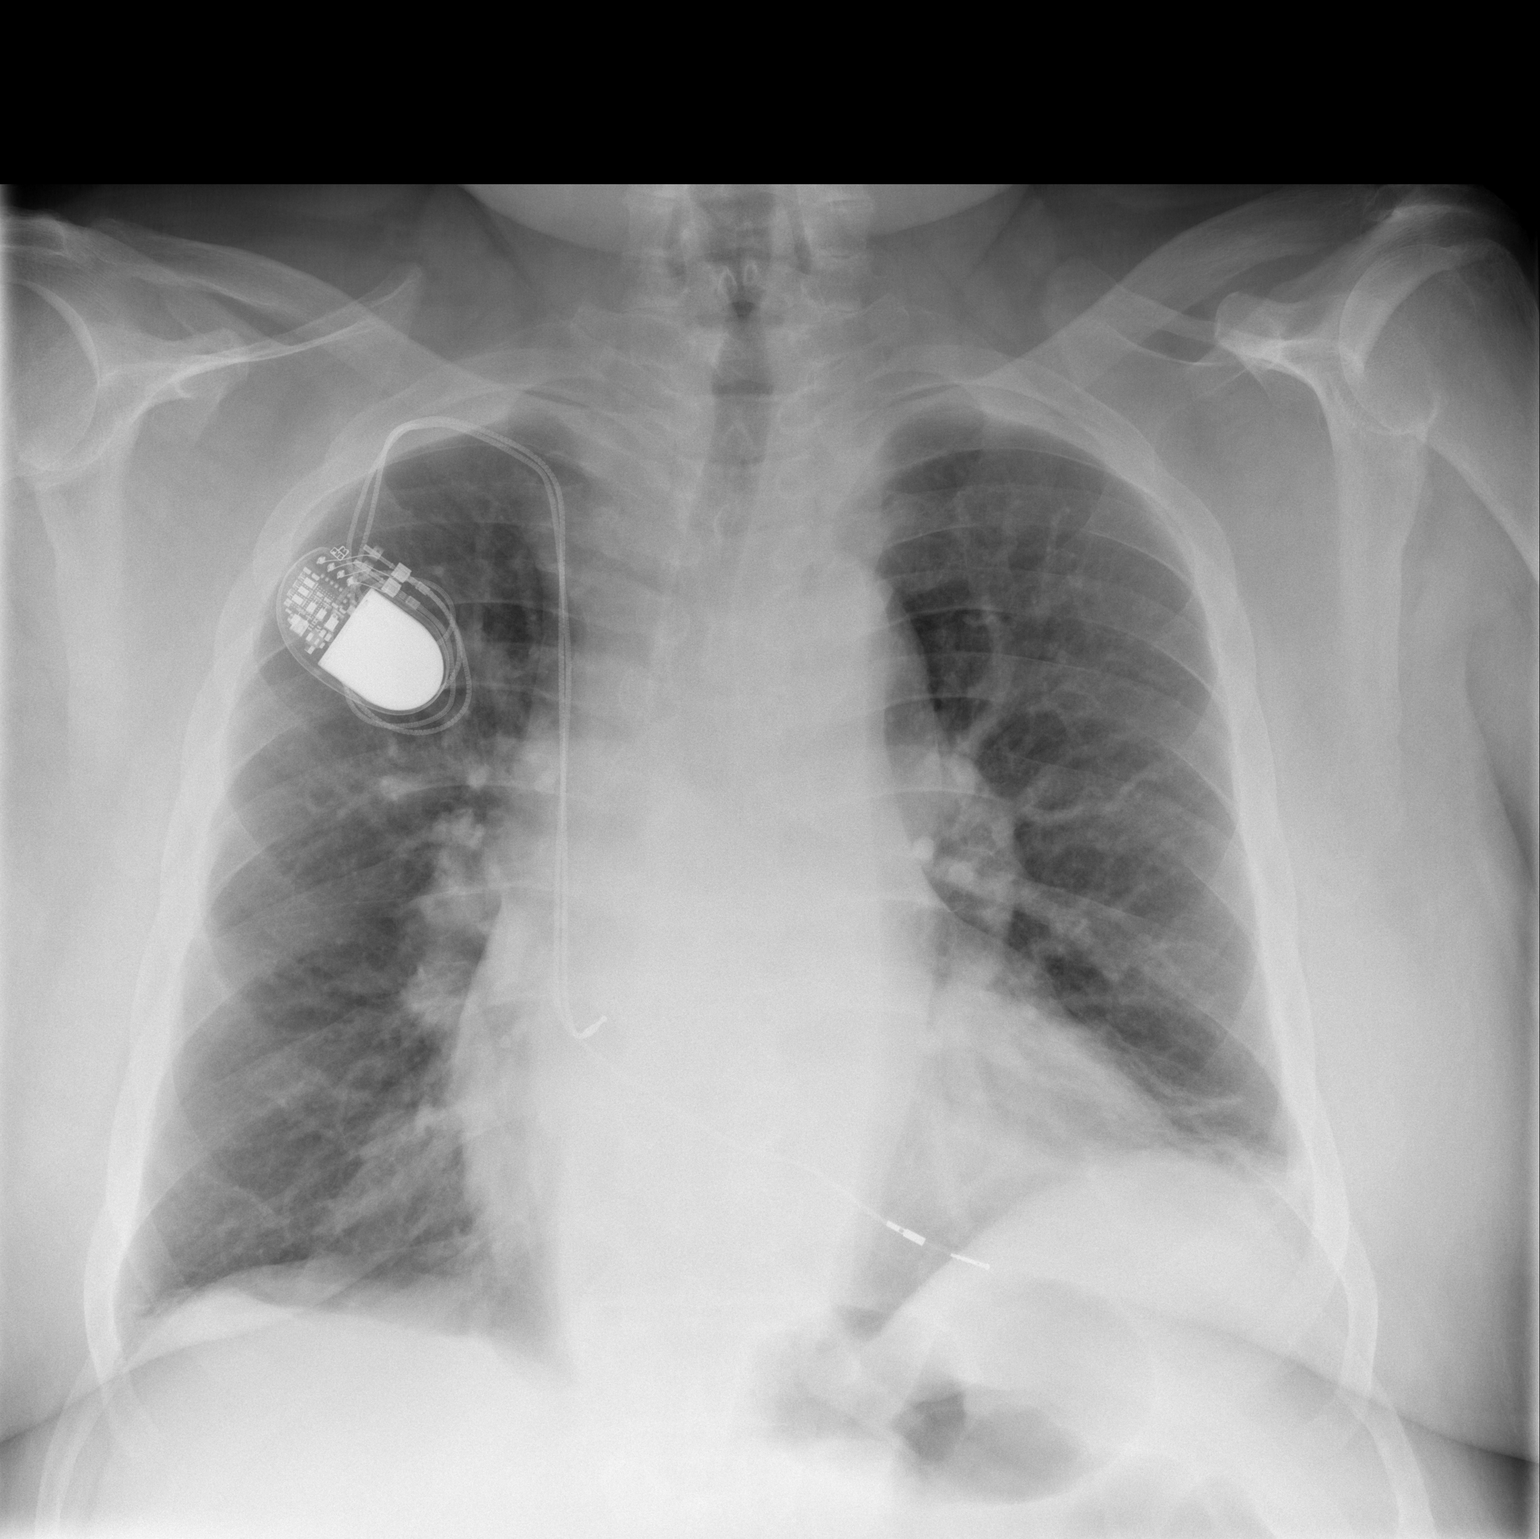

[w chest lat]
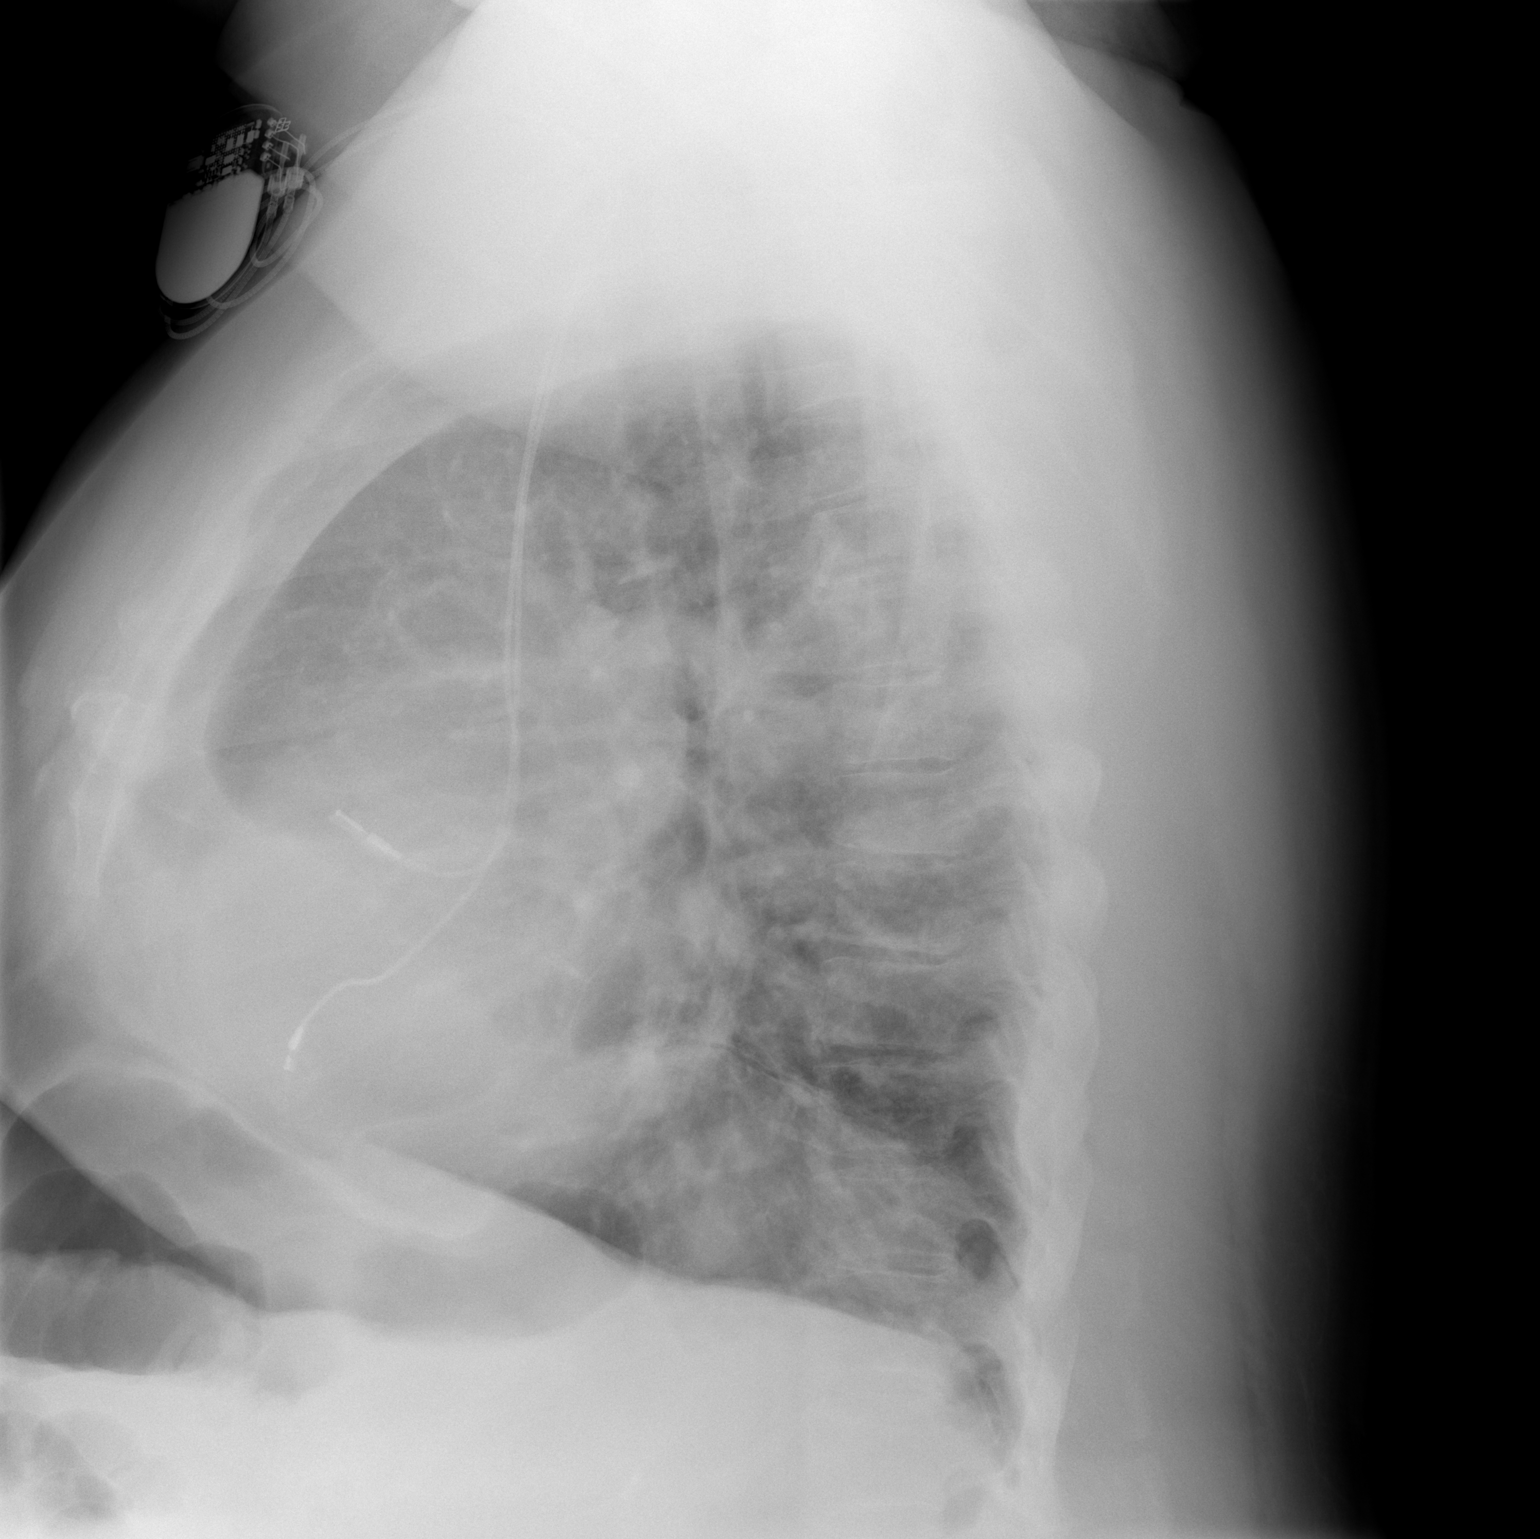

[2 of 2 positions shown; findings below may reference images not displayed]

FINDINGS: The lungs are mildly hyperinflated with hemidiaphragm flattening.
There is chronic mild elevation of the left hemidiaphragm laterally.
The lung markings are coarse at the left lung base and are slightly
more conspicuous today. The cardiac silhouette remains enlarged. The
pulmonary vascularity is mildly engorged. The mediastinum is mildly
widened but this is stable. The ICD is in stable position.
IMPRESSION: COPD. Probable subsegmental atelectasis or early infiltrate in the
left lower lobe. Followup PA and lateral chest X-ray is recommended
in 3-4 weeks following trial of antibiotic therapy to ensure
resolution and exclude underlying malignancy.

Cardiomegaly with pulmonary vascular congestion and mild
interstitial edema consistent with low-grade CHF.

## 2019-02-05 ENCOUNTER — Telehealth (HOSPITAL_COMMUNITY): Payer: Self-pay

## 2019-02-05 NOTE — Telephone Encounter (Signed)
Patient not available. Cannot leave message to call back to prescreen for echo.

## 2019-02-06 ENCOUNTER — Other Ambulatory Visit: Payer: Self-pay

## 2019-02-06 ENCOUNTER — Ambulatory Visit (HOSPITAL_COMMUNITY): Payer: Medicare (Managed Care) | Attending: Cardiovascular Disease

## 2019-02-06 DIAGNOSIS — I5032 Chronic diastolic (congestive) heart failure: Secondary | ICD-10-CM | POA: Diagnosis present

## 2019-02-13 ENCOUNTER — Other Ambulatory Visit: Payer: Self-pay

## 2019-02-13 DIAGNOSIS — I5032 Chronic diastolic (congestive) heart failure: Secondary | ICD-10-CM

## 2019-02-20 ENCOUNTER — Other Ambulatory Visit (HOSPITAL_COMMUNITY): Payer: Self-pay | Admitting: Cardiology

## 2019-02-22 ENCOUNTER — Telehealth: Payer: Self-pay | Admitting: *Deleted

## 2019-02-22 NOTE — Telephone Encounter (Signed)
Attempted to reach the patient concerning his appointment for tomorrow and was unable to leave a message.  Called Regina at [facility] and left a message to call back concerning the patient's virtual appointment for tomorrow.

## 2019-02-23 ENCOUNTER — Telehealth (INDEPENDENT_AMBULATORY_CARE_PROVIDER_SITE_OTHER): Payer: Medicare (Managed Care) | Admitting: Cardiovascular Disease

## 2019-02-23 ENCOUNTER — Encounter: Payer: Self-pay | Admitting: Cardiovascular Disease

## 2019-02-23 VITALS — Ht 72.0 in | Wt 312.0 lb

## 2019-02-23 DIAGNOSIS — E0822 Diabetes mellitus due to underlying condition with diabetic chronic kidney disease: Secondary | ICD-10-CM

## 2019-02-23 DIAGNOSIS — J984 Other disorders of lung: Secondary | ICD-10-CM

## 2019-02-23 DIAGNOSIS — I480 Paroxysmal atrial fibrillation: Secondary | ICD-10-CM | POA: Diagnosis not present

## 2019-02-23 DIAGNOSIS — I350 Nonrheumatic aortic (valve) stenosis: Secondary | ICD-10-CM | POA: Diagnosis not present

## 2019-02-23 DIAGNOSIS — Z794 Long term (current) use of insulin: Secondary | ICD-10-CM

## 2019-02-23 DIAGNOSIS — E7800 Pure hypercholesterolemia, unspecified: Secondary | ICD-10-CM

## 2019-02-23 DIAGNOSIS — I471 Supraventricular tachycardia: Secondary | ICD-10-CM

## 2019-02-23 DIAGNOSIS — G4733 Obstructive sleep apnea (adult) (pediatric): Secondary | ICD-10-CM

## 2019-02-23 DIAGNOSIS — E669 Obesity, unspecified: Secondary | ICD-10-CM

## 2019-02-23 DIAGNOSIS — I2510 Atherosclerotic heart disease of native coronary artery without angina pectoris: Secondary | ICD-10-CM

## 2019-02-23 DIAGNOSIS — H3530 Unspecified macular degeneration: Secondary | ICD-10-CM

## 2019-02-23 DIAGNOSIS — I10 Essential (primary) hypertension: Secondary | ICD-10-CM

## 2019-02-23 DIAGNOSIS — Z8679 Personal history of other diseases of the circulatory system: Secondary | ICD-10-CM

## 2019-02-23 DIAGNOSIS — T462X5A Adverse effect of other antidysrhythmic drugs, initial encounter: Secondary | ICD-10-CM

## 2019-02-23 DIAGNOSIS — I5032 Chronic diastolic (congestive) heart failure: Secondary | ICD-10-CM

## 2019-02-23 DIAGNOSIS — N183 Chronic kidney disease, stage 3 unspecified: Secondary | ICD-10-CM

## 2019-02-23 DIAGNOSIS — I495 Sick sinus syndrome: Secondary | ICD-10-CM

## 2019-02-23 DIAGNOSIS — Z950 Presence of cardiac pacemaker: Secondary | ICD-10-CM

## 2019-02-23 DIAGNOSIS — E1169 Type 2 diabetes mellitus with other specified complication: Secondary | ICD-10-CM

## 2019-02-23 DIAGNOSIS — J449 Chronic obstructive pulmonary disease, unspecified: Secondary | ICD-10-CM

## 2019-02-23 NOTE — Patient Instructions (Signed)

## 2019-02-23 NOTE — Progress Notes (Signed)
Virtual Visit via Telephone Note   This visit type was conducted due to national recommendations for restrictions regarding the COVID-19 Pandemic (e.g. social distancing) in an effort to limit this patient's exposure and mitigate transmission in our community.  Due to his co-morbid illnesses, this patient is at least at moderate risk for complications without adequate follow up.  This format is felt to be most appropriate for this patient at this time.  The patient did not have access to video technology/had technical difficulties with video requiring transitioning to audio format only (telephone).  All issues noted in this document were discussed and addressed.  No physical exam could be performed with this format.  Please refer to the patient's chart for his  consent to telehealth for [facility].   CONFERENCE CALL with the PATIENT and his daughter, HOYTE ZIEBELL  Date:  02/23/2019   ID:  [name], DOB 1946/06/05, MRN [id number]  Patient Location: Home Provider Location: Office  PCP:  [name], MD  Cardiologist:  Vallie Teters / Aundra Dubin (CHF) / Burt Knack (Structural heart) Electrophysiologist:  None   Evaluation Performed:  Follow-Up Visit  Chief Complaint:  AS, Pacemaker, CHF  History of Present Illness:    [name] is a 73 y.o. 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  He has a remote history of pacemaker lead associated thrombus complicated by right lower lobe pulmonary embolism in the remote past..  For the first time in over a year he was hospitalized in January 2020 with acute heart failure exacerbation and was found to have progression to severe aortic stenosis.  The initial steps for TAVR evaluation began, including right heart catheterization, but could not be completed due to acute kidney injury.  He has not yet undergone necessary coronary angiography and abdomen and pelvis CTA evaluations.  He was discharged to [facility] skilled nursing facility.  He is very sedentary, unable to walk due to blindness and neuropathy, and under those circumstances has not had recent issues with shortness of breath, edema, angina and denies dizziness or syncope.  He is compliant with his medications and sodium restriction and has been 7 days a week as well as being followed by heart failure para medicine.  He had a virtual visit with Darrick Grinder, NP on June 5.  His "dry weight" is estimated to be around 325 pounds or so.  Although I believe his pacemaker may have been checked while he was hospitalized in January, he has not had a formal documented pacemaker evaluation since September 2019.  He has difficulty performing remote downloads due to blindness, and has preferred in clinic checks up to the coronavirus pandemic.  He is not pacemaker dependent.  His nephrologist is Dr. Pearson Grippe.  Creatinine rose again to approximately 3.0 in April, but recently has decreased to 1.9-2.1 which is better than his average renal function throughout the previous year.  The patient does not have symptoms concerning for COVID-19 infection (  fever, chills, cough, or new shortness of breath).    Past Medical History:  Diagnosis Date   Arthritis    "all over"  (07-10-13)   Atrial fibrillation (HCC)    not Candidate for Coumadin d/t rectal bleeding   Benign prostatic hypertrophy    Cardiomyopathy, hypertensive ([place])     diastolic dysfunction by 2130 echo   Coronary artery disease     nonobstructive. 2 vessel. cardiac catheter in 11/22/03 showing 60% LAD occlusion, 30% circumflex occlusion.   Degenerative joint disease     Right knee.   Diabetes type 2, uncontrolled ([place])    Diabetic peripheral neuropathy ([place])    Diabetic ulcer of thigh ([place])    H/O Rt thigh ulcer.   Exertional shortness of breath    "comes and goes" (07/10/2013)   GERD (gastroesophageal reflux disease)    Gout    Herpes    Hyperlipidemia    Hypertension    Hypotension 09/05/2013   Incarcerated ventral hernia    H/O. S/P repair ventral hernia repair 07/2005.   LBBB (left bundle branch block)    Legally blind    Morbid obesity ([place])    Obstructive sleep apnea    On CPAP. Last sleep study (06/2009) - Severe obstructive sleep apnea/hypopnea syndrome, apnea-hypopnea index 70.5 per hour with non positional events moderately loud snoring and oxygen desaturation to a nadir of 85% on room air.   Occult blood positive stool     History of in August 2007.  colonoscopy in January 2008 showing normal colon with fair inadequate prep. By Dr. Silvano Rusk.   Pacemaker 2003-11-22    secondary to bradycardia   Sepsis ([place]) 08/26/2013   Past Surgical History:  Procedure Laterality Date   AMPUTATION Left 06/22/2013   Procedure: AMPUTATION FOOT;  Surgeon: Newt Minion, MD;  Location: [place];  Service: Orthopedics;  Laterality: Left;  Left Midfoot Amputation   CARDIAC CATHETERIZATION  10/22/2003   Dilatation of the LV with preserved LV systolic function. Tortous aorta, but no AAA. Moderate disease with 50-60% area of narrowing in the circumflex after the second OM and 70% mid narrowing in the LAD. No intervention.   CARDIOVASCULAR STRESS TEST  01/23/2010   Perfusion  defect seen in inferior myocardial region is consistent with diaphragmatic attenuation. Remaining myocardium demonstrates normal myocardial perfusion with no evidence of ischemia or infarct. Stress ECG was non-diagnostic due to LBBB.   CATARACT EXTRACTION, BILATERAL Bilateral    ESOPHAGOGASTRODUODENOSCOPY N/A 10/12/2013   Procedure: ESOPHAGOGASTRODUODENOSCOPY (EGD);  Surgeon: Gwenyth Ober, MD;  Location: [facility] ENDOSCOPY;  Service: General;  Laterality: N/A;  bedside   EYE SURGERY     FOOT AMPUTATION THROUGH METATARSAL Left 06/22/2013   midfoot/notes 06/22/2013   HERNIA REPAIR     INSERT / REPLACE / REMOVE PACEMAKER  11-22-2003   Secondary to symptomatic bradycardia. DDDR pacemaker. By Dr. Rollene Fare.   INSERT / REPLACE / REMOVE PACEMAKER  Nov 21, 2012   "old pacemaker died; had new one put in" (07-10-13)   Pacemaker lead revision  12/2003    likely microdislodgment  of right ventricular lead   PEG PLACEMENT N/A 10/12/2013   Procedure: PERCUTANEOUS ENDOSCOPIC GASTROSTOMY (PEG) PLACEMENT;  Surgeon: Gwenyth Ober, MD;  Location: [facility];  Service: General;  Laterality: N/A;   PERMANENT PACEMAKER GENERATOR CHANGE N/A 11/03/2012   Procedure: PERMANENT PACEMAKER GENERATOR CHANGE;  Surgeon: Sanda Klein, MD;  Location: [facility] CATH LAB;  Service: Cardiovascular;  Laterality: N/A;   REFRACTIVE SURGERY Bilateral  RIGHT HEART CATH N/A 09/05/2018   Procedure: RIGHT HEART CATH;  Surgeon: Larey Dresser, MD;  Location: [place] CV LAB;  Service: Cardiovascular;  Laterality: N/A;   TEE WITHOUT CARDIOVERSION N/A 09/05/2018   Procedure: TRANSESOPHAGEAL ECHOCARDIOGRAM (TEE);  Surgeon: Larey Dresser, MD;  Location: [facility] ENDOSCOPY;  Service: Cardiovascular;  Laterality: N/A;   TRANSTHORACIC ECHOCARDIOGRAM  01/01/2013   EF 50-55%, ventricular septum-thickness moderately increased   VENOUS DOPPLER  01/17/2013   No evidence of thrombus or thrombophlebitis. Left GSVs appear enlarged and demonstrate valvular  insuffiiciency. Bilateral SSVs appeared patent with no valvular insufficiency seen.   VENTRAL HERNIA REPAIR  07/2005    S/P laparotomy, lysis of adhesions, repair of incarcerated ventral hernia with  partial omentectomy.. Performed by Dr. Marlou Starks.     Current Meds  Medication Sig   acetaminophen (TYLENOL) 650 MG CR tablet Take 1,300 mg by mouth 2 (two) times daily.    albuterol (PROVENTIL HFA;VENTOLIN HFA) 108 (90 Base) MCG/ACT inhaler Inhale 2 puffs into the lungs every 6 (six) hours as needed for wheezing or shortness of breath.   amLODipine (NORVASC) 10 MG tablet Take 10 mg by mouth daily. Reported on 12/09/2015   antiseptic oral rinse (BIOTENE) LIQD 15 mLs by Mouth Rinse route 3 (three) times daily.   aspirin EC 81 MG tablet Take 1 tablet (81 mg total) by mouth daily.   atorvastatin (LIPITOR) 40 MG tablet Take 1 tablet (40 mg total) by mouth daily.   cholecalciferol (VITAMIN D) 1000 units tablet Take 1,000 Units by mouth daily.   citalopram (CELEXA) 20 MG tablet Take 20 mg by mouth daily.   famotidine (PEPCID) 20 MG tablet Take 20 mg by mouth daily.   Febuxostat (ULORIC) 80 MG TABS Take 80 mg by mouth daily.    gabapentin (NEURONTIN) 300 MG capsule Take 300 mg by mouth 2 (two) times daily.   hydrALAZINE (APRESOLINE) 50 MG tablet Take 1 tablet (50 mg total) 3 (three) times daily by mouth.   insulin glargine (LANTUS) 100 UNIT/ML injection Inject 0.25 mLs (25 Units total) into the skin at bedtime.   lidocaine (LIDODERM) 5 % Place 1 patch onto the skin daily as needed (back pain). Remove & Discard patch within 12 hours or as directed by MD   Liraglutide (VICTOZA) 18 MG/3ML SOPN Inject 1.8 mg into the skin daily.   Melatonin 3 MG CAPS Take 1 capsule by mouth at bedtime as needed (sleep).    Menthol, Topical Analgesic, (BIOFREEZE EX) Apply 1 application topically 3 (three) times daily as needed (pain). Reported on 10/27/2015   methylcellulose (ARTIFICIAL TEARS) 1 % ophthalmic  solution Place 1 drop into both eyes 2 (two) times daily as needed (dry eyes).    metolazone (ZAROXOLYN) 2.5 MG tablet Take 1 tablet (2.5 mg total) by mouth every other day. (Patient taking differently: Take 1.25 mg by mouth every Monday, Wednesday, and Friday. )   metoprolol succinate (TOPROL-XL) 50 MG 24 hr tablet Take 1 tablet (50 mg total) by mouth daily. Take with or immediately following a meal.   polyethylene glycol (MIRALAX / GLYCOLAX) packet Take 17 g by mouth daily as needed for moderate constipation.   potassium chloride SA (K-DUR) 20 MEQ tablet Take 5 tablets (100 mEq total) by mouth daily.   sennosides-docusate sodium (SENOKOT-S) 8.6-50 MG tablet Take 2 tablets by mouth 2 (two) times daily.    Skin Protectants, Misc. (EUCERIN) cream Apply 1 application topically 2 (two) times daily.   torsemide (DEMADEX) 20  MG tablet Take 6 tablets (120 mg total) by mouth 2 (two) times daily.     Allergies:   Patient has no known allergies.   Social History   Tobacco Use   Smoking status: Former Smoker    Packs/day: 0.00    Years: 0.00    Pack years: 0.00    Types: Cigarettes    Quit date: 08/17/2003    Years since quitting: 15.5   Smokeless tobacco: Never Used   Tobacco comment: started at age 55.  only smoked on occ- very rarely.0  Substance Use Topics   Alcohol use: Yes    Comment: 06/23/2013 "drank some; never had a problem w/it"   Drug use: No     Family Hx: The patient's family history includes Allergies in his sister; Asthma in his brother; Coronary artery disease in his sister; Heart disease in his sister; Rheum arthritis in his brother.  ROS:   Please see the history of present illness.     All other systems reviewed and are negative.   Prior CV studies:   The following studies were reviewed today:  TEE January 2020 with LVEF 55-60% and moderate LVH, aortic valve mean gradient 35 mmHg, aortic valve area 1.0 cm by continuity equation.  Right heart  catheterization January 2020 RA pressure 15 mmHg, PA pressure 60/53 (mean 44) millimeter Hg, mean PA WP 20 mmHg, cardiac index 3.8 L/minute/meters squared, PVR 3 W units  Labs/Other Tests and Data Reviewed:    EKG:  An ECG dated 09/28/2018 was personally reviewed today and demonstrated:  Sinus rhythm with a single ventricular paced beat, delayed R wave progression, inferior T wave inversion  Recent Labs: 08/31/2018: B Natriuretic Peptide 151.0 09/15/2018: Hemoglobin 11.8; Platelets 223 11/28/2018: BUN 76; Creatinine 3.1; Potassium 3.4; Sodium 132   Recent Lipid Panel Lab Results  Component Value Date/Time   CHOL 115 03/21/2018 10:44 AM   CHOL 129 01/05/2017 12:35 PM   TRIG 80 03/21/2018 10:44 AM   HDL 33 (L) 03/21/2018 10:44 AM   HDL 36 (L) 01/05/2017 12:35 PM   CHOLHDL 3.5 03/21/2018 10:44 AM   LDLCALC 66 03/21/2018 10:44 AM   LDLCALC 71 01/05/2017 12:35 PM    Wt Readings from Last 3 Encounters:  02/23/19 (!) 312 lb (141.5 kg)  09/28/18 (!) 338 lb 6.4 oz (153.5 kg)  09/08/18 (!) 317 lb 14.4 oz (144.2 kg)     Objective:    Vital Signs:  Ht 6' (1.829 m)    Wt (!) 312 lb (141.5 kg)    BMI 42.31 kg/m    VITAL SIGNS:  reviewed Unable to examine  ASSESSMENT & PLAN:    1. CHF: Currently appears compensated and as far as I can tell via phone interaction, sounds like he is euvolemic.  No change to his diuretic regimen is recommended at this time. 2. Aortic stenosis: Now severe, need to reinitiate the work-up for TAVR.  He is a very challenging patient with high risk for contrast nephrotoxicity, with CT or coronary angiography, but really does not have other options.  He is definitely not a candidate for surgical AVR.  We will get in touch with Dr. Burt Knack in the structural heart team. 3. CKD 3: His renal function is the best it is been a long time and greatly improved from the beginning of the year.  If we are to pursue TAVR now is probably the best time to do it.  We will have to  adjust his imaging protocols  to limit the use of contrast.  We will involve Dr. Joelyn Oms as well. 4. OSA: Compliant with CPAP and oxygen at night. 5. Morbid obesity: Further increases risks of complications. 6. SSS: No symptoms of bradycardia. 7. Pacemaker: Medtronic 2005, generator change 2014, device programmed DDI to avoid tracking of atrial tachycardia).  His device has not been formally checked since September 2019.  Fortunately is not pacemaker dependent and is estimated to have several more years of generator longevity, but his device has not been formally evaluated in about 10 months.  If he comes in either for an office visit with Dr. Burt Knack or to the hospital for any procedures we will need to interrogate his device.  We will also try again to see if we can get him enrolled in remote device checks.  His daughter says she will try to help. 8. HTN: Unable to check today, but usually with excellent control. 9. CAD: He did have a 60% LAD stenosis by catheterization 2005.  He has never had angina or known myocardial infarction.  10. Asc Ao Aneurysm: Small, unlikely to interfere with TAVR. 11. Parox AFib: Asymptomatic.  Most commonly he has atrial flutter, with intermittent blanking of atrial waves and ventricular tracking at fast rates, which is why his pacemaker is programmed DDI.  No good antiarrhythmic options.  He was evaluated by EP and felt to be a poor candidate for ablation.  Amiodarone stopped due to suspected lung toxicity.  Not on anticoagulation due to history of 2015 intracranial hemorrhage.  Has also had episodes of paroxysmal atrial tachycardia with one-to-one conduction in the past. 12. DM: Complicated by PAD with left foot amputation and diabetic neuropathy.  Currently well controlled, but his most recent hemoglobin A1c is from December 2018 (7.1%).  He did have hyperglycemia in April which is why his renal function transiently worsened. 13. HLP: Most recent LDL cholesterol was 66 on  current statin dose.  His situation is extremely challenging.  He is at high risk for complications and death during work-up for TAVR or TAVR itself, but his prognosis is probably less than 6-12 months without TAVR.  I pointed out to the patient and his daughter that ultimately he may be best suited for palliative care, but they wish to pursue the possibility of aortic valve replacement, if possible.   COVID-19 Education: The signs and symptoms of COVID-19 were discussed with the patient and how to seek care for testing (follow up with PCP or arrange E-visit).  The importance of social distancing was discussed today.  Time:   Today, I have spent 44 minutes with the patient and his daughter in a teleconference with telehealth technology discussing the above problems.     Medication Adjustments/Labs and Tests Ordered: Current medicines are reviewed at length with the patient today.  Concerns regarding medicines are outlined above.   Tests Ordered: No orders of the defined types were placed in this encounter.   Medication Changes: No orders of the defined types were placed in this encounter.   Follow Up:  Virtual Visit or In Person Structural heart clinic.  Arrange remote pacemaker check.  Signed, Sanda Klein, MD  02/23/2019 11:17 AM     [facility]

## 2019-02-28 ENCOUNTER — Other Ambulatory Visit: Payer: Self-pay | Admitting: Nurse Practitioner

## 2019-02-28 ENCOUNTER — Ambulatory Visit
Admission: RE | Admit: 2019-02-28 | Discharge: 2019-02-28 | Disposition: A | Discharge status: Home / Self Care | Payer: Medicare (Managed Care) | Source: Ambulatory Visit | Attending: Nurse Practitioner | Admitting: Nurse Practitioner

## 2019-02-28 ENCOUNTER — Other Ambulatory Visit: Payer: Self-pay

## 2019-02-28 DIAGNOSIS — W19XXXS Unspecified fall, sequela: Secondary | ICD-10-CM

## 2019-02-28 DIAGNOSIS — Z9189 Other specified personal risk factors, not elsewhere classified: Secondary | ICD-10-CM

## 2019-03-01 ENCOUNTER — Other Ambulatory Visit (HOSPITAL_COMMUNITY): Payer: Self-pay | Admitting: Nurse Practitioner

## 2019-03-01 ENCOUNTER — Other Ambulatory Visit: Payer: Self-pay | Admitting: Nurse Practitioner

## 2019-03-01 DIAGNOSIS — Z9189 Other specified personal risk factors, not elsewhere classified: Secondary | ICD-10-CM

## 2019-03-01 DIAGNOSIS — W19XXXA Unspecified fall, initial encounter: Secondary | ICD-10-CM

## 2019-03-08 ENCOUNTER — Telehealth: Payer: Self-pay

## 2019-03-08 NOTE — Telephone Encounter (Signed)
    COVID-19 Pre-Screening Questions:  . In the past 7 to 10 days have you had a cough,  shortness of breath, headache, congestion, fever (100 or greater) body aches, chills, sore throat, or sudden loss of taste or sense of smell? . Have you been around anyone with known Covid 19. . Have you been around anyone who is awaiting Covid 19 test results in the past 7 to 10 days? . Have you been around anyone who has been exposed to Covid 19, or has mentioned symptoms of Covid 19 within the past 7 to 10 days?  If you have any concerns/questions about symptoms patients report during screening (either on the phone or at threshold). Contact the provider seeing the patient or DOD for further guidance.  If neither are available contact a member of the leadership team.       I called and spoke with patient, he answered no to all of the above questions

## 2019-03-09 ENCOUNTER — Encounter: Payer: Self-pay | Admitting: Cardiovascular Disease

## 2019-03-09 ENCOUNTER — Other Ambulatory Visit: Payer: Self-pay

## 2019-03-09 ENCOUNTER — Ambulatory Visit (INDEPENDENT_AMBULATORY_CARE_PROVIDER_SITE_OTHER): Payer: Medicare (Managed Care) | Admitting: *Deleted

## 2019-03-09 ENCOUNTER — Institutional Professional Consult (permissible substitution): Payer: Medicare (Managed Care) | Admitting: Cardiovascular Disease

## 2019-03-09 ENCOUNTER — Ambulatory Visit (INDEPENDENT_AMBULATORY_CARE_PROVIDER_SITE_OTHER): Payer: Medicare (Managed Care) | Admitting: Cardiovascular Disease

## 2019-03-09 VITALS — BP 130/70 | HR 75 | Ht 72.0 in | Wt 317.0 lb

## 2019-03-09 DIAGNOSIS — Z950 Presence of cardiac pacemaker: Secondary | ICD-10-CM

## 2019-03-09 DIAGNOSIS — I471 Supraventricular tachycardia: Secondary | ICD-10-CM

## 2019-03-09 DIAGNOSIS — I495 Sick sinus syndrome: Secondary | ICD-10-CM | POA: Diagnosis not present

## 2019-03-09 DIAGNOSIS — I350 Nonrheumatic aortic (valve) stenosis: Secondary | ICD-10-CM | POA: Diagnosis not present

## 2019-03-09 LAB — CUP PACEART INCLINIC DEVICE CHECK
Battery Impedance: 809 Ohm
Battery Remaining Longevity: 48 mo
Battery Voltage: 2.77 V
Brady Statistic AP VP Percent: 14 %
Brady Statistic AP VS Percent: 0 %
Brady Statistic AS VP Percent: 13 %
Brady Statistic AS VS Percent: 73 %
Date Time Interrogation Session: 20200724184757
Implantable Lead Implant Date: 20050301
Implantable Lead Implant Date: 20050310
Implantable Lead Location: 753859
Implantable Lead Location: 753860
Implantable Lead Model: 5092
Implantable Lead Model: 5594
Implantable Pulse Generator Implant Date: 20140321
Lead Channel Impedance Value: 435 Ohm
Lead Channel Impedance Value: 586 Ohm
Lead Channel Pacing Threshold Amplitude: 2.5 V
Lead Channel Pacing Threshold Pulse Width: 1 ms
Lead Channel Sensing Intrinsic Amplitude: 1 mV
Lead Channel Sensing Intrinsic Amplitude: 15.67 mV
Lead Channel Setting Pacing Amplitude: 2 V
Lead Channel Setting Pacing Amplitude: 5 V
Lead Channel Setting Pacing Pulse Width: 1 ms
Lead Channel Setting Sensing Sensitivity: 5.6 mV

## 2019-03-09 NOTE — Progress Notes (Signed)
Pacemaker check in clinic. Normal device function. Sensing and impedances consistent with previous measurements. RA threshold not tested due to presenting AT. RV bipolar threshold remains chronically elevated at 2.5V @ 1.63ms, output increased to 5V @ 1.39ms, did not attempt unipolar threshold test today. 52.8% AT/AF burden, suspect burden is higher due to intermittent blanking of atrial signals, no OAC due to hx of ICH per notes. 5 high ventricular rates noted--1:1 SVT and possible NSVT per markers, longest 6sec. Device programmed at appropriate safety margins. Histogram distribution appropriate for patient activity level. Estimated longevity 4 years. Patient enrolled in remote follow-up, patient's daughter is agreeable to remote monitoring, printed instructions given, will order new Carelink monitor. Patient education completed. Carelink on 06/08/19 and ROV with Dr. Sallyanne Kuster in 08/2019.

## 2019-03-09 NOTE — Patient Instructions (Signed)
Medication Instructions:  Your provider recommends that you continue on your current medications as directed. Please refer to the Current Medication list given to you today.    Labwork: TODAY! BMET, CBC  Follow-Up: Dr. Burt Knack will discuss your case at the Valve Team meeting and will be in touch.

## 2019-03-09 NOTE — Progress Notes (Addendum)
Cardiology Office Note:    Date:  03/09/2019   ID:  [name], DOB [birth year], MRN [id number]  PCP:  [name], MD  Cardiologist:  No primary care provider on file.  Electrophysiologist:  None   Referring MD: [name], MD   Chief Complaint  Patient presents with   Shortness of Breath   History of Present Illness:    [name] is a 73 y.o. male with complex medical history, presenting for evaluation of severe aortic stenosis.  I first met this patient in the hospital in January of this year when he was hospitalized with acute on chronic diastolic heart failure in the setting of severe aortic stenosis.  His hospitalization was complicated by acute kidney injury and at the time I did not feel he was a good candidate to move forward with TAVR evaluation.  He has very poor functional capacity related to morbid obesity, chronic deconditioning, RV dysfunction, and blindness.  The patient has undergone permanent pacemaker placement.  He has a history suspicious for amiodarone toxicity.  He was last hospitalized in January 2020 with acute on chronic diastolic heart failure.  The patient recently underwent an echocardiogram demonstrating severe aortic stenosis and is referred back for discussion regarding treatment options.  The patient is here with his daughter today.  She lives in [place].  The patient continues to live independently.  He goes to [place] about 2 days/week.  He also has an aide who comes in every morning and every evening to help him.  He is basically nonambulatory but is able to walk a little bit within his house.  He complains of shortness of breath with activity.  Even at his low activity level he experiences significant dyspnea.  He has no chest pain.  He denies orthopnea or PND.  He sleeps with a CPAP device.  He reports good compliance with this.  The patient has a longstanding heart murmur.  He used to work in Psychologist, prison and probation services.  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  He has not been able to be anticoagulated for atrial fibrillation because of history of cerebral hemorrhage.  Past Medical History:  Diagnosis Date   Arthritis    "all over" (06/23/2013)   Atrial fibrillation (HCC)    not Candidate for Coumadin d/t rectal bleeding   Benign prostatic hypertrophy    Cardiomyopathy, hypertensive ([facility])     diastolic dysfunction by 2956 echo   Coronary artery disease     nonobstructive. 2 vessel. cardiac catheter in March 2005 showing 60% LAD occlusion, 30% circumflex occlusion.   Degenerative joint disease     Right knee.   Diabetes type 2, uncontrolled ([facility])    Diabetic peripheral neuropathy ([facility])    Diabetic ulcer of thigh ([facility])    H/O Rt thigh ulcer.   Exertional shortness of breath    "comes and goes" (06/23/2013)   GERD (gastroesophageal reflux disease)    Gout    Herpes    Hyperlipidemia    Hypertension    Hypotension 09/05/2013   Incarcerated ventral hernia    H/O. S/P repair ventral hernia repair 07/2005.   LBBB (left bundle branch block)    Legally blind    Morbid obesity ([facility])    Obstructive sleep apnea    On CPAP. Last sleep study (06/2009) - Severe obstructive sleep apnea/hypopnea syndrome, apnea-hypopnea index 70.5 per hour with non positional events moderately loud snoring and oxygen desaturation to  a nadir of 85% on room air.   Occult blood positive stool     History of in August 2007.  colonoscopy in January 2008 showing normal colon with fair inadequate prep. By Dr. Silvano Rusk.   Pacemaker October 31, 2003    secondary to bradycardia   Sepsis ([facility]) 08/26/2013    Past Surgical History:  Procedure Laterality Date   AMPUTATION Left 06/22/2013   Procedure: AMPUTATION FOOT;  Surgeon: Newt Minion, MD;  Location: [facility];  Service: Orthopedics;  Laterality: Left;  Left Midfoot Amputation   CARDIAC CATHETERIZATION  10/22/2003   Dilatation of the LV with preserved LV  systolic function. Tortous aorta, but no AAA. Moderate disease with 50-60% area of narrowing in the circumflex after the second OM and 70% mid narrowing in the LAD. No intervention.   CARDIOVASCULAR STRESS TEST  01/23/2010   Perfusion defect seen in inferior myocardial region is consistent with diaphragmatic attenuation. Remaining myocardium demonstrates normal myocardial perfusion with no evidence of ischemia or infarct. Stress ECG was non-diagnostic due to LBBB.   CATARACT EXTRACTION, BILATERAL Bilateral    ESOPHAGOGASTRODUODENOSCOPY N/A 10/12/2013   Procedure: ESOPHAGOGASTRODUODENOSCOPY (EGD);  Surgeon: Gwenyth Ober, MD;  Location: [facility] ENDOSCOPY;  Service: General;  Laterality: N/A;  bedside   EYE SURGERY     FOOT AMPUTATION THROUGH METATARSAL Left 06/22/2013   midfoot/notes 06/22/2013   HERNIA REPAIR     INSERT / REPLACE / REMOVE PACEMAKER  10/31/03   Secondary to symptomatic bradycardia. DDDR pacemaker. By Dr. Rollene Fare.   INSERT / REPLACE / REMOVE PACEMAKER  Oct 30, 2012   "old pacemaker died; had new one put in" (07-03-2013)   Pacemaker lead revision  12/2003    likely microdislodgment  of right ventricular lead   PEG PLACEMENT N/A 10/12/2013   Procedure: PERCUTANEOUS ENDOSCOPIC GASTROSTOMY (PEG) PLACEMENT;  Surgeon: Gwenyth Ober, MD;  Location: [facility];  Service: General;  Laterality: N/A;   PERMANENT PACEMAKER GENERATOR CHANGE N/A 11/03/2012   Procedure: PERMANENT PACEMAKER GENERATOR CHANGE;  Surgeon: Sanda Klein, MD;  Location: [facility] CATH LAB;  Service: Cardiovascular;  Laterality: N/A;   REFRACTIVE SURGERY Bilateral    RIGHT HEART CATH N/A 09/05/2018   Procedure: RIGHT HEART CATH;  Surgeon: Larey Dresser, MD;  Location: [facility] CV LAB;  Service: Cardiovascular;  Laterality: N/A;   TEE WITHOUT CARDIOVERSION N/A 09/05/2018   Procedure: TRANSESOPHAGEAL ECHOCARDIOGRAM (TEE);  Surgeon: Larey Dresser, MD;  Location: [facility] ENDOSCOPY;  Service: Cardiovascular;  Laterality: N/A;     TRANSTHORACIC ECHOCARDIOGRAM  01/01/2013   EF 50-55%, ventricular septum-thickness moderately increased   VENOUS DOPPLER  01/17/2013   No evidence of thrombus or thrombophlebitis. Left GSVs appear enlarged and demonstrate valvular insuffiiciency. Bilateral SSVs appeared patent with no valvular insufficiency seen.   VENTRAL HERNIA REPAIR  07/2005    S/P laparotomy, lysis of adhesions, repair of incarcerated ventral hernia with  partial omentectomy.. Performed by Dr. Marlou Starks.    Current Medications: Current Meds  Medication Sig   acetaminophen (TYLENOL) 650 MG CR tablet Take 1,300 mg by mouth 2 (two) times daily.    albuterol (PROVENTIL HFA;VENTOLIN HFA) 108 (90 Base) MCG/ACT inhaler Inhale 2 puffs into the lungs every 6 (six) hours as needed for wheezing or shortness of breath.   amLODipine (NORVASC) 10 MG tablet Take 10 mg by mouth daily. Reported on 12/09/2015   antiseptic oral rinse (BIOTENE) LIQD 15 mLs by Mouth Rinse route 3 (three) times daily.   aspirin EC 81 MG tablet Take 1 tablet (  81 mg total) by mouth daily.   atorvastatin (LIPITOR) 40 MG tablet Take 1 tablet (40 mg total) by mouth daily.   cholecalciferol (VITAMIN D) 1000 units tablet Take 1,000 Units by mouth daily.   citalopram (CELEXA) 20 MG tablet Take 20 mg by mouth daily.   famotidine (PEPCID) 20 MG tablet Take 20 mg by mouth daily.   Febuxostat (ULORIC) 80 MG TABS Take 80 mg by mouth daily.    gabapentin (NEURONTIN) 300 MG capsule Take 300 mg by mouth 2 (two) times daily.   hydrALAZINE (APRESOLINE) 50 MG tablet Take 1 tablet (50 mg total) 3 (three) times daily by mouth.   insulin glargine (LANTUS) 100 UNIT/ML injection Inject 0.25 mLs (25 Units total) into the skin at bedtime.   lidocaine (LIDODERM) 5 % Place 1 patch onto the skin daily as needed (back pain). Remove & Discard patch within 12 hours or as directed by MD   Liraglutide (VICTOZA) 18 MG/3ML SOPN Inject 1.8 mg into the skin daily.   Melatonin 3 MG  CAPS Take 1 capsule by mouth at bedtime as needed (sleep).    Menthol, Topical Analgesic, (BIOFREEZE EX) Apply 1 application topically 3 (three) times daily as needed (pain). Reported on 10/27/2015   methylcellulose (ARTIFICIAL TEARS) 1 % ophthalmic solution Place 1 drop into both eyes 2 (two) times daily as needed (dry eyes).    metolazone (ZAROXOLYN) 2.5 MG tablet Take 1 tablet (2.5 mg total) by mouth every other day.   metoprolol succinate (TOPROL-XL) 50 MG 24 hr tablet Take 1 tablet (50 mg total) by mouth daily. Take with or immediately following a meal.   polyethylene glycol (MIRALAX / GLYCOLAX) packet Take 17 g by mouth daily as needed for moderate constipation.   potassium chloride SA (K-DUR) 20 MEQ tablet Take 5 tablets (100 mEq total) by mouth daily.   sennosides-docusate sodium (SENOKOT-S) 8.6-50 MG tablet Take 2 tablets by mouth 2 (two) times daily.    Skin Protectants, Misc. (EUCERIN) cream Apply 1 application topically 2 (two) times daily.   torsemide (DEMADEX) 20 MG tablet Take 6 tablets (120 mg total) by mouth 2 (two) times daily.     Allergies:   Patient has no known allergies.   Social History   Socioeconomic History   Marital status: Divorced    Spouse name: Not on file   Number of children: 4   Years of education: 12   Highest education level: 12th grade  Occupational History   Occupation: disabled  Scientist, product/process development strain: Somewhat hard   Food insecurity    Worry: Sometimes true    Inability: Sometimes true   Transportation needs    Medical: No    Non-medical: Yes  Tobacco Use   Smoking status: Former Smoker    Packs/day: 0.00    Years: 0.00    Pack years: 0.00    Types: Cigarettes    Quit date: 08/17/2003    Years since quitting: 15.5   Smokeless tobacco: Never Used   Tobacco comment: started at age 70.  only smoked on occ- very rarely.0  Substance and Sexual Activity   Alcohol use: Yes    Comment: 06/23/2013 "drank  some; never had a problem w/it"   Drug use: No   Sexual activity: Never  Lifestyle   Physical activity    Days per week: Not on file    Minutes per session: Not on file   Stress: Not on file  Relationships   Social  connections    Talks on phone: Not on file    Gets together: Not on file    Attends religious service: Not on file    Active member of club or organization: Not on file    Attends meetings of clubs or organizations: Not on file    Relationship status: Not on file  Other Topics Concern   Not on file  Social History Narrative   12/10- reports issues with housing- states it is not disability friendly but has ramp, first floor apt, and has grab bars/assistive devices provided by [facility].  Reports that he has concerns with having enough food but gets meals on wheels, eats at [facility] 3x a week, and has personal aid who cooks and shops for him.  Reports concerns with transportation but gets [facility] transport for all of his medical appts and his family or an aid helps him with other transportation needs.     Family History: The patient's family history includes Allergies in his sister; Asthma in his brother; Coronary artery disease in his sister; Heart disease in his sister; Rheum arthritis in his brother.  ROS:   Please see the history of present illness.    Positive for fatigue, leg swelling, blindness.  All other systems reviewed and are negative.  EKGs/Labs/Other Studies Reviewed:    The following studies were reviewed today: Echo 02-06-2019: IMPRESSIONS    1. The left ventricle has normal systolic function with an ejection fraction of 60-65%. The cavity size was normal. There is severely increased left ventricular wall thickness. Left ventricular diastolic Doppler parameters are consistent with impaired  relaxation. Elevated left atrial and left ventricular end-diastolic pressures The E/e' is >15. No evidence of left ventricular regional wall motion abnormalities.  2. The  right ventricle has normal systolic function. The cavity was normal. There is no increase in right ventricular wall thickness.  3. Left atrial size was moderately dilated.  4. Right atrial size was moderately dilated.  5. The mitral valve is abnormal. Mild thickening of the mitral valve leaflet.  6. The tricuspid valve is grossly normal.  7. The aortic valve is tricuspid. Moderate calcification of the aortic valve. Aortic valve regurgitation is trivial by color flow Doppler. Moderate-severe stenosis of the aortic valve.  8. There is mild dilatation of the ascending aorta measuring 39 mm.  9. The inferior vena cava was dilated in size with >50% respiratory variability. 10. When compared to the prior study: 09/02/2015: LVEF 60-65%, moderate LVH, mild to moderate aortic stenosis (mean gradient 13 mmHg).  SUMMARY   LVEF 60-65%, severe LVH, normal wall motion, grade 1 DD, elevated LV filling pressure, calcified aortic valve with moderate to severe stenosis - AVA around 1.0 cm2 - mean gradient 32 mmHg, mildly dilated aorta to 3.9 cm, moderate biatrial enlargement, dilated IVC that collapses  FINDINGS  Left Ventricle: The left ventricle has normal systolic function, with an ejection fraction of 60-65%. The cavity size was normal. There is severely increased left ventricular wall thickness. Left ventricular diastolic Doppler parameters are consistent  with impaired relaxation. Elevated left atrial and left ventricular end-diastolic pressures The E/e' is >15. No evidence of left ventricular regional wall motion abnormalities..  Right Ventricle: The right ventricle has normal systolic function. The cavity was normal. There is no increase in right ventricular wall thickness.  Left Atrium: Left atrial size was moderately dilated.  Right Atrium: Right atrial size was moderately dilated.  Interatrial Septum: No atrial level shunt detected by color flow Doppler.  Pericardium: There is no evidence  of pericardial effusion.  Mitral Valve: The mitral valve is abnormal. Mild thickening of the mitral valve leaflet. Mitral valve regurgitation is trivial by color flow Doppler.  Tricuspid Valve: The tricuspid valve is grossly normal. Tricuspid valve regurgitation is trivial by color flow Doppler.  Aortic Valve: The aortic valve is tricuspid Moderate calcification of the aortic valve. Aortic valve regurgitation is trivial by color flow Doppler. There is Moderate-severe stenosis of the aortic valve, with a calculated valve area of 1.01 cm.  Pulmonic Valve: The pulmonic valve was grossly normal. Pulmonic valve regurgitation is not visualized by color flow Doppler.  Aorta: There is mild dilatation of the ascending aorta measuring 39 mm.  Venous: The inferior vena cava measures 2.30 cm, is dilated in size with greater than 50% respiratory variability.  Compared to previous exam: 09/02/2015: LVEF 60-65%, moderate LVH, mild to moderate aortic stenosis (mean gradient 13 mmHg).   AORTIC VALVE                      +------------------+------------++  AV Area (Vmax):    1.09 cm       +------------------+------------++  AV Area (Vmean):   0.97 cm       +------------------+------------++  AV Area (VTI):     1.01 cm       +------------------+------------++  AV Vmax:           370.00 cm/s    +------------------+------------++  AV Vmean:          270.000 cm/s   +------------------+------------++  AV VTI:            0.842 m        +------------------+------------++  AV Peak Grad:      54.8 mmHg      +------------------+------------++  AV Mean Grad:      32.0 mmHg      +------------------+------------++  LVOT Vmax:         89.40 cm/s     +------------------+------------++  LVOT Vmean:        57.600 cm/s    +------------------+------------++  LVOT VTI:          0.188 m        +------------------+------------++  LVOT/AV VTI ratio: 0.22           +------------------+------------++  RHC  09/05/2018: Conclusion  1. Elevated R>L heart filling pressures, suspect right > left heart failure.  2. 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  Suspect OHS/OSA plays a role.  3. Preserved cardiac output.    Right Heart Pressures RHC Procedural Findings: Hemodynamics (mmHg) RA mean 15 RV 71/18 PA 68/23, mean 44 PCWP mean 20  Oxygen saturations: PA 64% AO 94%  Cardiac Output (Fick) 8.01  Cardiac Index (Fick) 3.08 PVR 3.0 WU    EKG:  EKG is not ordered today.    Recent Labs: 08/31/2018: B Natriuretic Peptide 151.0 09/15/2018: Hemoglobin 11.8; Platelets 223 11/28/2018: BUN 76; Creatinine 3.1; Potassium 3.4; Sodium 132  Recent Lipid Panel    Component Value Date/Time   CHOL 115 03/21/2018 1044   CHOL 129 01/05/2017 1235   TRIG 80 03/21/2018 1044   HDL 33 (L) 03/21/2018 1044   HDL 36 (L) 01/05/2017 1235   CHOLHDL 3.5 03/21/2018 1044   VLDL 16 03/21/2018 1044   LDLCALC 66 03/21/2018 1044   LDLCALC 71 01/05/2017 1235    Physical Exam:    VS:  BP 130/70    Pulse 75  Ht 6' (1.829 m)    Wt (!) 317 lb (143.8 kg)    SpO2 92%    BMI 42.99 kg/m     Wt Readings from Last 3 Encounters:  03/09/19 (!) 317 lb (143.8 kg)  02/23/19 (!) 312 lb (141.5 kg)  09/28/18 (!) 338 lb 6.4 oz (153.5 kg)     GEN: morbidly obese male in no acute distress HEENT: Normal NECK: unable to visualize JVP; BL carotid bruits LYMPHATICS: No lymphadenopathy CARDIAC: RRR, 3/6 harsh late peaking systolic murmur at the RUSB RESPIRATORY:  Inspiratory rales in the bases ABDOMEN: Soft, non-tender, non-distended, obese MUSCULOSKELETAL: Chronic stasis changes, 2+ right leg edema, 2-3+ left leg edema; No deformity  SKIN: Warm and dry NEUROLOGIC:  Alert and oriented x 3 PSYCHIATRIC:  Normal affect   STS Risk Calculator: STS Risk Calculator (Isolated AVR) Risk of Mortality: 12.494% Renal Failure: 48.130% Permanent Stroke: 1.295% Prolonged Ventilation: 45.609% DSW  Infection: 0.975% Reoperation: 4.691% Morbidity or Mortality: 60.104% Short Length of Stay: 4.506% Long Length of Stay:  ASSESSMENT:    1. Aortic valve stenosis, nonrheumatic    PLAN:    In order of problems listed above:  1. The patient has severe, stage D1 aortic stenosis.  I have personally reviewed his echo images which demonstrate severe calcification and restriction of all 3 aortic valve leaflets.  Peak and mean transvalvular gradients are 55 and 32 mmHg with a dimensionless index of 0.22.  LV function remains vigorous. I have reviewed the natural history of aortic stenosis with the patient and his daughter who is present today. We have discussed the limitations of medical therapy and the poor prognosis associated with symptomatic aortic stenosis. We have reviewed potential treatment options, including palliative medical therapy, conventional surgical aortic valve replacement, and transcatheter aortic valve replacement. We discussed treatment options in the context of the patient's specific comorbid medical conditions.  Again, significant medical problems include chronic diastolic heart failure, morbid obesity, severe pulmonary hypertension, stage IV chronic kidney disease, macular degeneration with blindness, type 2 diabetes, chronic venous stasis, and poor functional capacity.  We had a frank discussion about his prognosis with congestive heart failure, severe aortic stenosis, and stage IV chronic kidney disease.  The patient is at risk of progressive kidney injury with TAVR evaluation and they understand that at a minimum he would require CT angiography studies, diagnostic cardiac catheterization, and formal surgical evaluation prior to TAVR.  I do not think the patient would be a surgical candidate under any circumstances.  The question is whether we should entertain moving forward with TAVR or focus on comfort and quality of life with a palliative approach to his care.  The patient and  his daughter would like to talk things over.  I think it would be important to update a renal panel to see what his current renal function is.  We will discuss his case at our multidisciplinary heart valve meeting and I would like to discuss his case further with Dr. Aundra Dubin and Dr. Sallyanne Kuster, both of whom have provided his cardiology care in recent years.  I am going to call his daughter after these discussions next week.   Medication Adjustments/Labs and Tests Ordered: Current medicines are reviewed at length with the patient today.  Concerns regarding medicines are outlined above.  Orders Placed This Encounter  Procedures   Basic metabolic panel   CBC with Differential/Platelet   No orders of the defined types were placed in this encounter.   Patient Instructions  Medication  Instructions:  Your provider recommends that you continue on your current medications as directed. Please refer to the Current Medication list given to you today.    Labwork: TODAY! BMET, CBC  Follow-Up: Dr. Burt Knack will discuss your case at the Valve Team meeting and will be in touch.    Signed, [name], MD  03/09/2019 5:04 PM    [place]  I spoke with the patient and his daughter on a three-way telephone call.  We discussed considerations around TAVR evaluation at length.  His case has been reviewed by our multidisciplinary heart valve team.  The patient has decided that he would like to move forward with TAVR evaluation.  He understands the limitations related to his comorbid medical conditions, and specifically those related to stage IV chronic kidney disease.  As a next step in his evaluation, will arrange a right and left heart catheterization with limited contrast.  He will then require CTA studies of the heart as well as the chest, abdomen, and pelvis and formal cardiac surgical consultation.  All of his questions are answered.  I have reviewed the risks, indications, and  alternatives to cardiac catheterization, possible angioplasty, and stenting with the patient. Risks include but are not limited to bleeding, infection, vascular injury, stroke, myocardial infection, arrhythmia, kidney injury, radiation-related injury in the case of prolonged fluoroscopy use, emergency cardiac surgery, and death. The patient understands the risks of serious complication is 1-2 in 3125 with diagnostic cardiac cath and 1-2% or less with angioplasty/stenting.   [name] 03/27/2019 4:49 PM

## 2019-03-10 LAB — BASIC METABOLIC PANEL
BUN/Creatinine Ratio: 31 — ABNORMAL HIGH (ref 10–24)
BUN: 78 mg/dL (ref 8–27)
CO2: 24 mmol/L (ref 20–29)
Calcium: 9.5 mg/dL (ref 8.6–10.2)
Chloride: 91 mmol/L — ABNORMAL LOW (ref 96–106)
Creatinine, Ser: 2.5 mg/dL — ABNORMAL HIGH (ref 0.76–1.27)
GFR calc Af Amer: 29 mL/min/{1.73_m2} — ABNORMAL LOW (ref >59)
GFR calc non Af Amer: 25 mL/min/{1.73_m2} — ABNORMAL LOW (ref >59)
Glucose: 196 mg/dL — ABNORMAL HIGH (ref 65–99)
Potassium: 4 mmol/L (ref 3.5–5.2)
Sodium: 134 mmol/L (ref 134–144)

## 2019-03-10 LAB — CBC WITH DIFFERENTIAL/PLATELET
Basophils Absolute: 0.1 10*3/uL (ref 0.0–0.2)
Basos: 0 %
EOS (ABSOLUTE): 0.1 10*3/uL (ref 0.0–0.4)
Eos: 1 %
Hematocrit: 36.8 % — ABNORMAL LOW (ref 37.5–51.0)
Hemoglobin: 11.9 g/dL — ABNORMAL LOW (ref 13.0–17.7)
Immature Grans (Abs): 0.1 10*3/uL (ref 0.0–0.1)
Immature Granulocytes: 1 %
Lymphocytes Absolute: 2 10*3/uL (ref 0.7–3.1)
Lymphs: 17 %
MCH: 26.2 pg — ABNORMAL LOW (ref 26.6–33.0)
MCHC: 32.3 g/dL (ref 31.5–35.7)
MCV: 81 fL (ref 79–97)
Monocytes Absolute: 1.2 10*3/uL — ABNORMAL HIGH (ref 0.1–0.9)
Monocytes: 10 %
Neutrophils Absolute: 8.6 10*3/uL — ABNORMAL HIGH (ref 1.4–7.0)
Neutrophils: 71 %
Platelets: 207 10*3/uL (ref 150–450)
RBC: 4.55 x10E6/uL (ref 4.14–5.80)
RDW: 14.4 % (ref 11.6–15.4)
WBC: 12.1 10*3/uL — ABNORMAL HIGH (ref 3.4–10.8)

## 2019-03-11 NOTE — Progress Notes (Signed)
BUN and creatinine had come down to 1.8-1.9, now back up to 2.5.  Suspect TAVR is not going to be a good option and at this point palliative care may be best.

## 2019-03-15 ENCOUNTER — Telehealth: Payer: Self-pay | Admitting: Cardiovascular Disease

## 2019-03-15 NOTE — Telephone Encounter (Signed)
New message   Patient's daughter is calling to see if patient would be a good candidate for a TAVR procedure. Please call.

## 2019-03-15 NOTE — Telephone Encounter (Signed)
Called daughter and left message on her voicemail. Will try again tomorrow.

## 2019-03-15 NOTE — Telephone Encounter (Signed)
I seen in your office note that you were going to talk to the team about this pt and then call the daughter.  I wasn't sure if you had spoken with them yet or if you had tried to reach out to her?

## 2019-03-23 NOTE — Telephone Encounter (Signed)
Patient's daughter called back, she said she can call back her father.

## 2019-03-30 ENCOUNTER — Other Ambulatory Visit: Payer: Self-pay | Admitting: Physician Assistant

## 2019-04-02 ENCOUNTER — Other Ambulatory Visit: Payer: Self-pay

## 2019-04-02 DIAGNOSIS — N289 Disorder of kidney and ureter, unspecified: Secondary | ICD-10-CM

## 2019-04-02 DIAGNOSIS — I350 Nonrheumatic aortic (valve) stenosis: Secondary | ICD-10-CM

## 2019-04-04 ENCOUNTER — Telehealth: Payer: Self-pay | Admitting: Cardiovascular Disease

## 2019-04-04 NOTE — Telephone Encounter (Signed)
I spoke with [name] and made her aware that the pt will be having a cardiac cath on 8/25 as part of TAVR evaluation.  She asked that in the future when the pt has testing and requires certain instructions that we fax a copy of them to [phone] so that the [facility] nurse is also aware.  I advised her that Stem (pt's daughter) is very involved in the pt's care and she already has a copy of written instructions. We will try to accommodate this request going forward.

## 2019-04-04 NOTE — Telephone Encounter (Signed)
°  Please call Rollene Fare from [facility] to discuss the patient's care in the future  Fax: [phone]

## 2019-04-04 NOTE — Telephone Encounter (Signed)
New Message   Per [facility], Dr.Robert Bradd Burner needs to know the type of procedure that the patient is having on 04/10/19. Please call to discuss.

## 2019-04-05 NOTE — Telephone Encounter (Signed)
Left message for Rollene Fare at [phone].  The pt's daughter contacted me in regards to whether or not the pt needs to continue taking ASA for cardiac catheterization.  I made her aware that this medication is continued for this procedure.

## 2019-04-06 ENCOUNTER — Other Ambulatory Visit: Payer: Self-pay

## 2019-04-06 ENCOUNTER — Other Ambulatory Visit: Payer: Medicare (Managed Care) | Admitting: *Deleted

## 2019-04-06 ENCOUNTER — Other Ambulatory Visit (HOSPITAL_COMMUNITY)
Admission: RE | Admit: 2019-04-06 | Discharge: 2019-04-06 | Disposition: A | Discharge status: Home / Self Care | Payer: Medicare (Managed Care) | Source: Ambulatory Visit | Attending: Cardiovascular Disease | Admitting: Cardiovascular Disease

## 2019-04-06 DIAGNOSIS — Z20828 Contact with and (suspected) exposure to other viral communicable diseases: Secondary | ICD-10-CM | POA: Insufficient documentation

## 2019-04-06 DIAGNOSIS — I350 Nonrheumatic aortic (valve) stenosis: Secondary | ICD-10-CM

## 2019-04-06 DIAGNOSIS — Z01812 Encounter for preprocedural laboratory examination: Secondary | ICD-10-CM | POA: Diagnosis not present

## 2019-04-06 DIAGNOSIS — N289 Disorder of kidney and ureter, unspecified: Secondary | ICD-10-CM

## 2019-04-06 LAB — SARS CORONAVIRUS 2 (TAT 6-24 HRS): SARS Coronavirus 2: NEGATIVE

## 2019-04-07 LAB — CBC
Hematocrit: 36.3 % — ABNORMAL LOW (ref 37.5–51.0)
Hemoglobin: 11.6 g/dL — ABNORMAL LOW (ref 13.0–17.7)
MCH: 25.3 pg — ABNORMAL LOW (ref 26.6–33.0)
MCHC: 32 g/dL (ref 31.5–35.7)
MCV: 79 fL (ref 79–97)
Platelets: 209 10*3/uL (ref 150–450)
RBC: 4.58 x10E6/uL (ref 4.14–5.80)
RDW: 14.5 % (ref 11.6–15.4)
WBC: 13.1 10*3/uL — ABNORMAL HIGH (ref 3.4–10.8)

## 2019-04-07 LAB — BASIC METABOLIC PANEL
BUN/Creatinine Ratio: 31 — ABNORMAL HIGH (ref 10–24)
BUN: 67 mg/dL — ABNORMAL HIGH (ref 8–27)
CO2: 23 mmol/L (ref 20–29)
Calcium: 9.2 mg/dL (ref 8.6–10.2)
Chloride: 95 mmol/L — ABNORMAL LOW (ref 96–106)
Creatinine, Ser: 2.14 mg/dL — ABNORMAL HIGH (ref 0.76–1.27)
GFR calc Af Amer: 35 mL/min/{1.73_m2} — ABNORMAL LOW (ref >59)
GFR calc non Af Amer: 30 mL/min/{1.73_m2} — ABNORMAL LOW (ref >59)
Glucose: 174 mg/dL — ABNORMAL HIGH (ref 65–99)
Potassium: 3.7 mmol/L (ref 3.5–5.2)
Sodium: 137 mmol/L (ref 134–144)

## 2019-04-09 ENCOUNTER — Telehealth: Payer: Self-pay | Admitting: *Deleted

## 2019-04-09 NOTE — Telephone Encounter (Addendum)
Pt contacted pre-catheterization scheduled at [facility] for: Tuesday April 10, 2019 12 noon Verified arrival time and place: [facility]) at: 7 AM -pre procedure hydration.  Regarding IV flow rate for hydration. Copied from staff message from Dr Burt Knack 04/04/19: Let's just do 1 cc/kg on arrival    No solid food after midnight prior to cath, clear liquids until 5 AM day of procedure. Contrast allergy: no  Hold: Torsemide-day before and day of procedure. KCl-day before and day of procedure-pt thinks he took AM 04/09/19. Metolazone-day before and day of procedure. Victoza-day of procedure. 1/2 usual Lantus Insulin PM prior to procedure  Except hold medications AM meds can be  taken pre-cath with sip of water including: ASA 81 mg   Confirmed patient has responsible person to drive home post procedure and observe 24 hours after arriving home: yes  Currently, due to Covid-19 pandemic, only one support person will be allowed with patient. Must be the same support person for that patient's entire stay, will be screened and required to wear a mask. They will be asked to wait in the waiting room for the duration of the patient's stay.  Patients are required to wear a mask when they enter the hospital.      COVID-19 Pre-Screening Questions:  . In the past 7 to 10 days have you had a cough,  shortness of breath, headache, congestion, fever (100 or greater) body aches, chills, sore throat, or sudden loss of taste or sense of smell? no . Have you been around anyone with known Covid 19? no . Have you been around anyone who is awaiting Covid 19 test results in the past 7 to 10 days? Yes-pt's daughter, asymptomatic, she reports negative result. . Have you been around anyone who has been exposed to Covid 19, or has mentioned symptoms of Covid 19 within the past 7 to 10 ? no     I reviewed procedure/mask/visitor,Covid-19 screening questions with patient's  daughter, [name], she verbalized understanding, thanked me for call.

## 2019-04-10 ENCOUNTER — Other Ambulatory Visit: Payer: Self-pay

## 2019-04-10 ENCOUNTER — Ambulatory Visit (HOSPITAL_COMMUNITY)
Admission: RE | Admit: 2019-04-10 | Discharge: 2019-04-10 | Disposition: A | Discharge status: Home / Self Care | Payer: Medicare (Managed Care) | Attending: Cardiovascular Disease | Admitting: Cardiovascular Disease

## 2019-04-10 ENCOUNTER — Encounter (HOSPITAL_COMMUNITY)
Admission: RE | Disposition: A | Discharge status: Home / Self Care | Payer: Self-pay | Source: Home / Self Care | Attending: Cardiovascular Disease

## 2019-04-10 DIAGNOSIS — Z955 Presence of coronary angioplasty implant and graft: Secondary | ICD-10-CM | POA: Diagnosis not present

## 2019-04-10 DIAGNOSIS — Z7982 Long term (current) use of aspirin: Secondary | ICD-10-CM | POA: Diagnosis not present

## 2019-04-10 DIAGNOSIS — M109 Gout, unspecified: Secondary | ICD-10-CM | POA: Diagnosis not present

## 2019-04-10 DIAGNOSIS — I2510 Atherosclerotic heart disease of native coronary artery without angina pectoris: Secondary | ICD-10-CM | POA: Diagnosis not present

## 2019-04-10 DIAGNOSIS — E1142 Type 2 diabetes mellitus with diabetic polyneuropathy: Secondary | ICD-10-CM | POA: Diagnosis not present

## 2019-04-10 DIAGNOSIS — Z87891 Personal history of nicotine dependence: Secondary | ICD-10-CM | POA: Insufficient documentation

## 2019-04-10 DIAGNOSIS — Z8261 Family history of arthritis: Secondary | ICD-10-CM | POA: Diagnosis not present

## 2019-04-10 DIAGNOSIS — N400 Enlarged prostate without lower urinary tract symptoms: Secondary | ICD-10-CM | POA: Insufficient documentation

## 2019-04-10 DIAGNOSIS — M1990 Unspecified osteoarthritis, unspecified site: Secondary | ICD-10-CM | POA: Diagnosis not present

## 2019-04-10 DIAGNOSIS — I4891 Unspecified atrial fibrillation: Secondary | ICD-10-CM | POA: Diagnosis not present

## 2019-04-10 DIAGNOSIS — I272 Pulmonary hypertension, unspecified: Secondary | ICD-10-CM | POA: Diagnosis not present

## 2019-04-10 DIAGNOSIS — Z8249 Family history of ischemic heart disease and other diseases of the circulatory system: Secondary | ICD-10-CM | POA: Insufficient documentation

## 2019-04-10 DIAGNOSIS — Z6841 Body Mass Index (BMI) 40.0 and over, adult: Secondary | ICD-10-CM | POA: Diagnosis not present

## 2019-04-10 DIAGNOSIS — Z89432 Acquired absence of left foot: Secondary | ICD-10-CM | POA: Diagnosis not present

## 2019-04-10 DIAGNOSIS — E1122 Type 2 diabetes mellitus with diabetic chronic kidney disease: Secondary | ICD-10-CM | POA: Diagnosis not present

## 2019-04-10 DIAGNOSIS — N189 Chronic kidney disease, unspecified: Secondary | ICD-10-CM | POA: Insufficient documentation

## 2019-04-10 DIAGNOSIS — G4733 Obstructive sleep apnea (adult) (pediatric): Secondary | ICD-10-CM | POA: Diagnosis not present

## 2019-04-10 DIAGNOSIS — Z794 Long term (current) use of insulin: Secondary | ICD-10-CM | POA: Diagnosis not present

## 2019-04-10 DIAGNOSIS — K219 Gastro-esophageal reflux disease without esophagitis: Secondary | ICD-10-CM | POA: Insufficient documentation

## 2019-04-10 DIAGNOSIS — I130 Hypertensive heart and chronic kidney disease with heart failure and stage 1 through stage 4 chronic kidney disease, or unspecified chronic kidney disease: Secondary | ICD-10-CM | POA: Insufficient documentation

## 2019-04-10 DIAGNOSIS — I350 Nonrheumatic aortic (valve) stenosis: Secondary | ICD-10-CM | POA: Diagnosis present

## 2019-04-10 DIAGNOSIS — E785 Hyperlipidemia, unspecified: Secondary | ICD-10-CM | POA: Insufficient documentation

## 2019-04-10 DIAGNOSIS — Z79899 Other long term (current) drug therapy: Secondary | ICD-10-CM | POA: Diagnosis not present

## 2019-04-10 HISTORY — PX: RIGHT/LEFT HEART CATH AND CORONARY ANGIOGRAPHY: CATH118266

## 2019-04-10 LAB — POCT I-STAT EG7
Acid-Base Excess: 2 mmol/L (ref 0.0–2.0)
Bicarbonate: 25.5 mmol/L (ref 20.0–28.0)
Bicarbonate: 28.2 mmol/L — ABNORMAL HIGH (ref 20.0–28.0)
Calcium, Ion: 1.16 mmol/L (ref 1.15–1.40)
Calcium, Ion: 1.24 mmol/L (ref 1.15–1.40)
HCT: 36 % — ABNORMAL LOW (ref 39.0–52.0)
HCT: 39 % (ref 39.0–52.0)
Hemoglobin: 12.2 g/dL — ABNORMAL LOW (ref 13.0–17.0)
Hemoglobin: 13.3 g/dL (ref 13.0–17.0)
O2 Saturation: 70 %
O2 Saturation: 75 %
Potassium: 3.2 mmol/L — ABNORMAL LOW (ref 3.5–5.1)
Potassium: 3.6 mmol/L (ref 3.5–5.1)
Sodium: 142 mmol/L (ref 135–145)
Sodium: 145 mmol/L (ref 135–145)
TCO2: 27 mmol/L (ref 22–32)
TCO2: 30 mmol/L (ref 22–32)
pCO2, Ven: 44.1 mmHg (ref 44.0–60.0)
pCO2, Ven: 49.3 mmHg (ref 44.0–60.0)
pH, Ven: 7.365 (ref 7.250–7.430)
pH, Ven: 7.37 (ref 7.250–7.430)
pO2, Ven: 38 mmHg (ref 32.0–45.0)
pO2, Ven: 41 mmHg (ref 32.0–45.0)

## 2019-04-10 LAB — POCT I-STAT 7, (LYTES, BLD GAS, ICA,H+H)
Acid-Base Excess: 2 mmol/L (ref 0.0–2.0)
Bicarbonate: 27.2 mmol/L (ref 20.0–28.0)
Calcium, Ion: 1.24 mmol/L (ref 1.15–1.40)
HCT: 37 % — ABNORMAL LOW (ref 39.0–52.0)
Hemoglobin: 12.6 g/dL — ABNORMAL LOW (ref 13.0–17.0)
O2 Saturation: 99 %
Potassium: 3.5 mmol/L (ref 3.5–5.1)
Sodium: 141 mmol/L (ref 135–145)
TCO2: 29 mmol/L (ref 22–32)
pCO2 arterial: 44.9 mmHg (ref 32.0–48.0)
pH, Arterial: 7.391 (ref 7.350–7.450)
pO2, Arterial: 128 mmHg — ABNORMAL HIGH (ref 83.0–108.0)

## 2019-04-10 LAB — BASIC METABOLIC PANEL
Anion gap: 11 (ref 5–15)
BUN: 54 mg/dL — ABNORMAL HIGH (ref 8–23)
CO2: 25 mmol/L (ref 22–32)
Calcium: 9 mg/dL (ref 8.9–10.3)
Chloride: 100 mmol/L (ref 98–111)
Creatinine, Ser: 2.08 mg/dL — ABNORMAL HIGH (ref 0.61–1.24)
GFR calc Af Amer: 36 mL/min — ABNORMAL LOW (ref >60)
GFR calc non Af Amer: 31 mL/min — ABNORMAL LOW (ref >60)
Glucose, Bld: 116 mg/dL — ABNORMAL HIGH (ref 70–99)
Potassium: 4 mmol/L (ref 3.5–5.1)
Sodium: 136 mmol/L (ref 135–145)

## 2019-04-10 LAB — GLUCOSE, CAPILLARY
Glucose-Capillary: 101 mg/dL — ABNORMAL HIGH (ref 70–99)
Glucose-Capillary: 100 mg/dL — ABNORMAL HIGH (ref 70–99)
Glucose-Capillary: 95 mg/dL (ref 70–99)

## 2019-04-10 SURGERY — RIGHT/LEFT HEART CATH AND CORONARY ANGIOGRAPHY
Anesthesia: LOCAL

## 2019-04-10 MED ORDER — SODIUM CHLORIDE 0.9% FLUSH: 3.0000 mL | Freq: Two times a day (BID) | INTRAVENOUS | Status: DC

## 2019-04-10 MED ORDER — VERAPAMIL HCL 2.5 MG/ML IV SOLN
INTRAVENOUS | Status: AC
  Filled 2019-04-10: qty 2

## 2019-04-10 MED ORDER — SODIUM CHLORIDE 0.9 % WEIGHT BASED INFUSION
1.0000 mL/kg/h | INTRAVENOUS | Status: DC
  Administered 2019-04-10: 08:00:00 1 mL/kg/h via INTRAVENOUS

## 2019-04-10 MED ORDER — IOHEXOL 350 MG/ML SOLN
INTRAVENOUS | Status: DC | PRN
  Administered 2019-04-10: 20 mL via INTRA_ARTERIAL

## 2019-04-10 MED ORDER — MIDAZOLAM HCL 2 MG/2ML IJ SOLN
INTRAMUSCULAR | Status: AC
  Filled 2019-04-10: qty 2

## 2019-04-10 MED ORDER — HEPARIN SODIUM (PORCINE) 1000 UNIT/ML IJ SOLN
INTRAMUSCULAR | Status: DC | PRN
  Administered 2019-04-10: 6000 [IU] via INTRAVENOUS

## 2019-04-10 MED ORDER — LABETALOL HCL 5 MG/ML IV SOLN: 10.0000 mg | INTRAVENOUS | Status: DC | PRN

## 2019-04-10 MED ORDER — LIDOCAINE HCL (PF) 1 % IJ SOLN
INTRAMUSCULAR | Status: DC | PRN
  Administered 2019-04-10: 5 mL

## 2019-04-10 MED ORDER — HEPARIN (PORCINE) IN NACL 1000-0.9 UT/500ML-% IV SOLN
INTRAVENOUS | Status: AC
  Filled 2019-04-10: qty 1000

## 2019-04-10 MED ORDER — HYDRALAZINE HCL 20 MG/ML IJ SOLN: 10.0000 mg | INTRAMUSCULAR | Status: DC | PRN

## 2019-04-10 MED ORDER — SODIUM CHLORIDE 0.9 % IV SOLN: 250.0000 mL | INTRAVENOUS | Status: DC | PRN

## 2019-04-10 MED ORDER — VERAPAMIL HCL 2.5 MG/ML IV SOLN
INTRAVENOUS | Status: DC | PRN
  Administered 2019-04-10: 10 mL via INTRA_ARTERIAL

## 2019-04-10 MED ORDER — HEPARIN SODIUM (PORCINE) 1000 UNIT/ML IJ SOLN
INTRAMUSCULAR | Status: AC
  Filled 2019-04-10: qty 1

## 2019-04-10 MED ORDER — ACETAMINOPHEN 325 MG PO TABS: 650.0000 mg | ORAL_TABLET | ORAL | Status: DC | PRN

## 2019-04-10 MED ORDER — SODIUM CHLORIDE 0.9% FLUSH: 3.0000 mL | INTRAVENOUS | Status: DC | PRN

## 2019-04-10 MED ORDER — FENTANYL CITRATE (PF) 100 MCG/2ML IJ SOLN
INTRAMUSCULAR | Status: DC | PRN
  Administered 2019-04-10: 12.5 ug via INTRAVENOUS

## 2019-04-10 MED ORDER — LIDOCAINE HCL (PF) 1 % IJ SOLN
INTRAMUSCULAR | Status: AC
  Filled 2019-04-10: qty 30

## 2019-04-10 MED ORDER — MIDAZOLAM HCL 2 MG/2ML IJ SOLN
INTRAMUSCULAR | Status: DC | PRN
  Administered 2019-04-10: 0.5 mg via INTRAVENOUS

## 2019-04-10 MED ORDER — ONDANSETRON HCL 4 MG/2ML IJ SOLN: 4.0000 mg | Freq: Four times a day (QID) | INTRAMUSCULAR | Status: DC | PRN

## 2019-04-10 MED ORDER — HEPARIN (PORCINE) IN NACL 1000-0.9 UT/500ML-% IV SOLN
INTRAVENOUS | Status: DC | PRN
  Administered 2019-04-10 (×2): 500 mL

## 2019-04-10 MED ORDER — SODIUM CHLORIDE 0.9 % IV SOLN: INTRAVENOUS | Status: DC

## 2019-04-10 MED ORDER — ASPIRIN 81 MG PO CHEW: 81.0000 mg | CHEWABLE_TABLET | ORAL | Status: DC

## 2019-04-10 MED ORDER — FENTANYL CITRATE (PF) 100 MCG/2ML IJ SOLN
INTRAMUSCULAR | Status: AC
  Filled 2019-04-10: qty 2

## 2019-04-10 SURGICAL SUPPLY — 12 items

## 2019-04-10 NOTE — H&P (Signed)
Cardiology Admission History and Physical:   Patient ID: [name] MRN: [id number]; DOB: 01-Dec-1945   Admission date: 04/10/2019  Primary Care Provider: [name], MD Primary Cardiologist: No primary care provider on file.  Primary Electrophysiologist:  None   Chief Complaint:  Shortness of breath  Patient Profile:   [name] is a 72 y.o. male with a complex medical history, presenting today for right and left heart catheterization as part of his work-up for TAVR  History of Present Illness:   [name] is a 73 year old gentleman with chronic kidney disease, morbid obesity, RV dysfunction, and blindness.  He has undergone permanent pacemaker placement in the past.  He has developed progressive and now severe aortic stenosis and presents today for right and left heart catheterization as part of his evaluation.  He reports no change in symptoms since his last office visit on March 09, 2019.  Heart Pathway Score:     Past Medical History:  Diagnosis Date  . Arthritis    "all over" (06/23/2013)  . Atrial fibrillation ([place])    not Candidate for Coumadin d/t rectal bleeding  . Benign prostatic hypertrophy   . Cardiomyopathy, hypertensive ([place])     diastolic dysfunction by 6160 echo  . Coronary artery disease     nonobstructive. 2 vessel. cardiac catheter in March 2005 showing 60% LAD occlusion, 30% circumflex occlusion.  . Degenerative joint disease     Right knee.  . Diabetes type 2, uncontrolled ([place])   . Diabetic peripheral neuropathy ([place])   . Diabetic ulcer of thigh ([place])    H/O Rt thigh ulcer.  . Exertional shortness of breath    "comes and goes" (06/23/2013)  . GERD (gastroesophageal reflux disease)   . Gout   . Herpes   . Hyperlipidemia   . Hypertension   . Hypotension 09/05/2013  . Incarcerated ventral hernia    H/O. S/P repair ventral hernia repair 07/2005.  Marland Kitchen LBBB (left bundle branch block)   . Legally blind   . Morbid obesity ([place])   .  Obstructive sleep apnea    On CPAP. Last sleep study (06/2009) - Severe obstructive sleep apnea/hypopnea syndrome, apnea-hypopnea index 70.5 per hour with non positional events moderately loud snoring and oxygen desaturation to a nadir of 85% on room air.  . Occult blood positive stool     History of in August 2007.  colonoscopy in January 2008 showing normal colon with fair inadequate prep. By Dr. Silvano Rusk.  . 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 Sepsis ([place]) 08/26/2013    Past Surgical History:  Procedure Laterality Date  . AMPUTATION Left 06/22/2013   Procedure: AMPUTATION FOOT;  Surgeon: Newt Minion, MD;  Location: [place];  Service: Orthopedics;  Laterality: Left;  Left Midfoot Amputation  . CARDIAC CATHETERIZATION  10/22/2003   Dilatation of the LV with preserved LV systolic function. Tortous aorta, but no AAA. Moderate disease with 50-60% area of narrowing in the circumflex after the second OM and 70% mid narrowing in the LAD. No intervention.  Marland Kitchen CARDIOVASCULAR STRESS TEST  01/23/2010   Perfusion defect seen in inferior myocardial region is consistent with diaphragmatic attenuation. Remaining myocardium demonstrates normal myocardial perfusion with no evidence of ischemia or infarct. Stress ECG was non-diagnostic due to LBBB.  Marland Kitchen CATARACT EXTRACTION, BILATERAL Bilateral   . ESOPHAGOGASTRODUODENOSCOPY N/A 10/12/2013   Procedure: ESOPHAGOGASTRODUODENOSCOPY (EGD);  Surgeon: Gwenyth Ober, MD;  Location: [place];  Service: General;  Laterality: N/A;  bedside  . EYE SURGERY    . 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 HERNIA REPAIR    . INSERT / REPLACE / REMOVE PACEMAKER  November 18, 2003   Secondary to symptomatic bradycardia. DDDR pacemaker. By Dr. Rollene Fare.  Randolm Idol / REPLACE / REMOVE PACEMAKER  2012/11/17   "old pacemaker died; had new one put in" (2013-07-05)  . Pacemaker lead revision  12/2003    likely microdislodgment  of right ventricular  lead  . PEG PLACEMENT N/A 10/12/2013   Procedure: PERCUTANEOUS ENDOSCOPIC GASTROSTOMY (PEG) PLACEMENT;  Surgeon: Gwenyth Ober, MD;  Location: [place];  Service: General;  Laterality: N/A;  . PERMANENT PACEMAKER GENERATOR CHANGE N/A 11/03/2012   Procedure: PERMANENT PACEMAKER GENERATOR CHANGE;  Surgeon: Sanda Klein, MD;  Location: [place] CATH LAB;  Service: Cardiovascular;  Laterality: N/A;  . REFRACTIVE SURGERY Bilateral   . RIGHT HEART CATH N/A 09/05/2018   Procedure: RIGHT HEART CATH;  Surgeon: Larey Dresser, MD;  Location: [place] CV LAB;  Service: Cardiovascular;  Laterality: N/A;  . TEE WITHOUT CARDIOVERSION N/A 09/05/2018   Procedure: TRANSESOPHAGEAL ECHOCARDIOGRAM (TEE);  Surgeon: Larey Dresser, MD;  Location: [facility] ENDOSCOPY;  Service: Cardiovascular;  Laterality: N/A;  . TRANSTHORACIC ECHOCARDIOGRAM  01/01/2013   EF 50-55%, ventricular septum-thickness moderately increased  . VENOUS DOPPLER  01/17/2013   No evidence of thrombus or thrombophlebitis. Left GSVs appear enlarged and demonstrate valvular insuffiiciency. Bilateral SSVs appeared patent with no valvular insufficiency seen.  Marland Kitchen VENTRAL HERNIA REPAIR  07/2005    S/P laparotomy, lysis of adhesions, repair of incarcerated ventral hernia with  partial omentectomy.. Performed by Dr. Marlou Starks.     Medications Prior to Admission: Prior to Admission medications   Medication Sig Start Date End Date Taking? Authorizing Provider  acetaminophen (TYLENOL) 650 MG CR tablet Take 1,300 mg by mouth 2 (two) times daily.    Yes [provider]  amLODipine (NORVASC) 10 MG tablet Take 10 mg by mouth daily. Reported on 12/09/2015   Yes [provider]  antiseptic oral rinse (BIOTENE) LIQD 15 mLs by Mouth Rinse route 3 (three) times daily as needed for dry mouth.    Yes [provider]  aspirin EC 81 MG tablet Take 1 tablet (81 mg total) by mouth daily. 01/19/19  Yes Clegg, Amy D, NP  cholecalciferol (VITAMIN D) 1000 units  tablet Take 1,000 Units by mouth daily.   Yes [provider]  citalopram (CELEXA) 20 MG tablet Take 20 mg by mouth daily.   Yes [provider]  famotidine (PEPCID) 20 MG tablet Take 20 mg by mouth daily.   Yes [provider]  Febuxostat (ULORIC) 80 MG TABS Take 80 mg by mouth daily.    Yes [provider]  gabapentin (NEURONTIN) 300 MG capsule Take 300 mg by mouth 2 (two) times daily.   Yes [provider]  hydrALAZINE (APRESOLINE) 50 MG tablet Take 1 tablet (50 mg total) 3 (three) times daily by mouth. 06/24/17  Yes Helberg, Larkin Ina, MD  insulin glargine (LANTUS) 100 UNIT/ML injection Inject 0.25 mLs (25 Units total) into the skin at bedtime. Patient taking differently: Inject 90 Units into the skin at bedtime.  09/08/18  Yes Mariel Aloe, MD  Liraglutide (VICTOZA) 18 MG/3ML SOPN Inject 1.8 mg into the skin daily.   Yes [provider]  Menthol, Topical Analgesic, (BIOFREEZE EX) Apply 1 application topically 2 (two) times daily as needed (knee/shoulder pain).    Yes  [provider]  metolazone (ZAROXOLYN) 2.5 MG tablet Take 1 tablet (2.5 mg total) by mouth every other day. Patient taking differently: Take 2.5 mg by mouth every Monday, Tuesday, Wednesday, Thursday, and Friday.  09/10/18  Yes Mariel Aloe, MD  metoprolol succinate (TOPROL-XL) 50 MG 24 hr tablet Take 1 tablet (50 mg total) by mouth daily. Take with or immediately following a meal. 03/21/18  Yes Larey Dresser, MD  potassium chloride SA (K-DUR) 20 MEQ tablet Take 5 tablets (100 mEq total) by mouth daily. 01/19/19  Yes Clegg, Amy D, NP  rosuvastatin (CRESTOR) 20 MG tablet Take 20 mg by mouth daily.   Yes [provider]  sennosides-docusate sodium (SENOKOT-S) 8.6-50 MG tablet Take 2 tablets by mouth 2 (two) times daily.    Yes [provider]  Skin Protectants, Misc. (EUCERIN) cream Apply 1 application topically 2 (two) times daily.   Yes [provider]  torsemide (DEMADEX) 100 MG tablet Take 100 mg by mouth 2 (two) times daily.   Yes [provider]  torsemide (DEMADEX) 20 MG tablet Take 6 tablets (120 mg total) by mouth 2 (two) times daily. Patient taking differently: Take 20 mg by mouth 2 (two) times daily.  09/08/18  Yes Mariel Aloe, MD  albuterol (PROVENTIL HFA;VENTOLIN HFA) 108 (90 Base) MCG/ACT inhaler Inhale 2 puffs into the lungs every 6 (six) hours as needed for wheezing or shortness of breath.    [provider]  polyethylene glycol (MIRALAX / GLYCOLAX) packet Take 17 g by mouth daily as needed for moderate constipation.    [provider]     Allergies:   No Known Allergies  Social History:   Social History   Socioeconomic History  . Marital status: Divorced    Spouse name: Not on file  . Number of children: 4  . Years of education: 74  . Highest education level: 12th grade  Occupational History  . Occupation: disabled  Social Needs  . Financial resource strain: Somewhat hard  . Food insecurity    Worry: Sometimes true    Inability: Sometimes true  . Transportation needs    Medical: No    Non-medical: Yes  Tobacco Use  . Smoking status: Former Smoker    Packs/day: 0.00    Years: 0.00    Pack years: 0.00    Types: Cigarettes    Quit date: 08/17/2003    Years since quitting: 15.6  . Smokeless tobacco: Never Used  . Tobacco comment: started at age 37.  only smoked on occ- very rarely.0  Substance and Sexual Activity  . Alcohol use: Yes    Comment: 06/23/2013 "drank some; never had a problem w/it"  . Drug use: No  . Sexual activity: Never  Lifestyle  . Physical activity    Days per week: Not on file    Minutes per session: Not on file  . Stress: Not on file  Relationships  . Social Herbalist on phone: Not on file    Gets together: Not on file    Attends religious service: Not on file    Active member of club or organization: Not on file    Attends  meetings of clubs or organizations: Not on file    Relationship status: Not on file  . Intimate partner violence    Fear of current or ex partner: Not on file    Emotionally abused: Not on file    Physically abused: Not  on file    Forced sexual activity: Not on file  Other Topics Concern  . Not on file  Social History Narrative   12/10- reports issues with housing- states it is not disability friendly but has ramp, first floor apt, and has grab bars/assistive devices provided by [employer].  Reports that he has concerns with having enough food but gets meals on wheels, eats at [employer] 3x a week, and has personal aid who cooks and shops for him.  Reports concerns with transportation but gets [facility] transport for all of his medical appts and his family or an aid helps him with other transportation needs.    Family History:   The patient's family history includes Allergies in his sister; Asthma in his brother; Coronary artery disease in his sister; Heart disease in his sister; Rheum arthritis in his brother.    ROS:  Please see the history of present illness.  All other ROS reviewed and negative.     Physical Exam/Data:   Vitals:   04/10/19 0700 04/10/19 0729  BP:  (!) 150/76  Pulse:  72  Resp:  18  Temp:  97.7 F (36.5 C)  TempSrc:  Skin  SpO2:  92%  Weight: (!) 143.8 kg (!) 141.1 kg  Height: 6' (1.829 m) 6' (1.829 m)   No intake or output data in the 24 hours ending 04/10/19 1034 Last 3 Weights 04/10/2019 04/10/2019 03/09/2019  Weight (lbs) 311 lb 317 lb 317 lb  Weight (kg) 141.069 kg 143.79 kg 143.79 kg     Body mass index is 42.18 kg/m.  General: Alert, oriented, sitting up in bed, in no acute distress HEENT: normal Lymph: no adenopathy Neck: Unable to visualize JVD Endocrine:  No thryomegaly Vascular: Bilateral carotid bruits Cardiac:  normal S1, S2; RRR; 3/6 harsh late peaking systolic murmur Lungs:  clear to auscultation bilaterally, no wheezing, rhonchi or rales  Abd: soft,  nontender, no hepatomegaly  Ext: 2+ bilateral pretibial edema with chronic stasis changes Musculoskeletal:  No deformities, BUE and BLE strength normal and equal Skin: warm and dry  Neuro:  CNs 2-12 intact, no focal abnormalities noted Psych:  Normal affect    Laboratory Data:  High Sensitivity Troponin:  No results for input(s): TROPONINIHS in the last 720 hours.    Chemistry Recent Labs  Lab 04/06/19 1406 04/10/19 0741  NA 137 136  K 3.7 4.0  CL 95* 100  CO2 23 25  GLUCOSE 174* 116*  BUN 67* 54*  CREATININE 2.14* 2.08*  CALCIUM 9.2 9.0  GFRNONAA 30* 31*  GFRAA 35* 36*  ANIONGAP  --  11    No results for input(s): PROT, ALBUMIN, AST, ALT, ALKPHOS, BILITOT in the last 168 hours. Hematology Recent Labs  Lab 04/06/19 1406  WBC 13.1*  RBC 4.58  HGB 11.6*  HCT 36.3*  MCV 79  MCH 25.3*  MCHC 32.0  RDW 14.5  PLT 209   BNPNo results for input(s): BNP, PROBNP in the last 168 hours.  DDimer No results for input(s): DDIMER in the last 168 hours.   Radiology/Studies:  No results found.  Assessment and Plan:   1. Severe, stage D1 aortic stenosis: The patient has New York Heart Association functional class III symptoms of chronic diastolic heart failure associated with his aortic stenosis.  Please see recent office note for details of extensive discussion regarding treatment options.  The patient presents today for right and left heart catheterization and possible PCI. I have reviewed the risks, indications, and  alternatives to cardiac catheterization, possible angioplasty, and stenting with the patient. Risks include but are not limited to bleeding, infection, vascular injury, stroke, myocardial infection, arrhythmia, kidney injury, radiation-related injury in the case of prolonged fluoroscopy use, emergency cardiac surgery, and death. The patient understands the risks of serious complication is 1-2 in 7225 with diagnostic cardiac cath and 1-2% or less with  angioplasty/stenting.    For questions or updates, please contact [place] Please consult [email or web address] for contact info under   Signed, [name], MD  04/10/2019 10:34 AM

## 2019-04-10 NOTE — Interval H&P Note (Signed)
History and Physical Interval Note:  04/10/2019 10:43 AM  [name]  has presented today for surgery, with the diagnosis of Aortic stenosis.  The various methods of treatment have been discussed with the patient and family. After consideration of risks, benefits and other options for treatment, the patient has consented to  Procedure(s): RIGHT/LEFT HEART CATH AND CORONARY ANGIOGRAPHY (N/A) as a surgical intervention.  The patient's history has been reviewed, patient examined, no change in status, stable for surgery.  I have reviewed the patient's chart and labs.  Questions were answered to the patient's satisfaction.     [name]

## 2019-04-10 NOTE — Discharge Instructions (Signed)
Radial Site Care  This sheet gives you information about how to care for yourself after your procedure. Your health care provider may also give you more specific instructions. If you have problems or questions, contact your health care provider. What can I expect after the procedure? After the procedure, it is common to have:  Bruising and tenderness at the catheter insertion area. Follow these instructions at home: Medicines  Take over-the-counter and prescription medicines only as told by your health care provider. Insertion site care  Follow instructions from your health care provider about how to take care of your insertion site. Make sure you: ? Wash your hands with soap and water before you change your bandage (dressing). If soap and water are not available, use hand sanitizer. ? Change your dressing as told by your health care provider. ? Leave stitches (sutures), skin glue, or adhesive strips in place. These skin closures may need to stay in place for 2 weeks or longer. If adhesive strip edges start to loosen and curl up, you may trim the loose edges. Do not remove adhesive strips completely unless your health care provider tells you to do that.  Check your insertion site every day for signs of infection. Check for: ? Redness, swelling, or pain. ? Fluid or blood. ? Pus or a bad smell. ? Warmth.  Do not take baths, swim, or use a hot tub until your health care provider approves.  You may shower 24-48 hours after the procedure, or as directed by your health care provider. ? Remove the dressing and gently wash the site with plain soap and water. ? Pat the area dry with a clean towel. ? Do not rub the site. That could cause bleeding.  Do not apply powder or lotion to the site. Activity   For 24 hours after the procedure, or as directed by your health care provider: ? Do not flex or bend the affected arm. ? Do not push or pull heavy objects with the affected arm. ? Do not  drive yourself home from the hospital or clinic. You may drive 24 hours after the procedure unless your health care provider tells you not to. ? Do not operate machinery or power tools.  Do not lift anything that is heavier than 10 lb (4.5 kg), or the limit that you are told, until your health care provider says that it is safe.  Ask your health care provider when it is okay to: ? Return to work or school. ? Resume usual physical activities or sports. ? Resume sexual activity. General instructions  If the catheter site starts to bleed, raise your arm and put firm pressure on the site. If the bleeding does not stop, get help right away. This is a medical emergency.  If you went home on the same day as your procedure, a responsible adult should be with you for the first 24 hours after you arrive home.  Keep all follow-up visits as told by your health care provider. This is important. Contact a health care provider if:  You have a fever.  You have redness, swelling, or yellow drainage around your insertion site. Get help right away if:  You have unusual pain at the radial site.  The catheter insertion area swells very fast.  The insertion area is bleeding, and the bleeding does not stop when you hold steady pressure on the area.  Your arm or hand becomes pale, cool, tingly, or numb. These symptoms may represent a serious problem   that is an emergency. Do not wait to see if the symptoms will go away. Get medical help right away. Call your local emergency services (911 in the [place] Do not drive yourself to the hospital. Summary  After the procedure, it is common to have bruising and tenderness at the site.  Follow instructions from your health care provider about how to take care of your radial site wound. Check the wound every day for signs of infection.  Do not lift anything that is heavier than 10 lb (4.5 kg), or the limit that you are told, until your health care provider says  that it is safe. This information is not intended to replace advice given to you by your health care provider. Make sure you discuss any questions you have with your health care provider. Document Released: 09/04/2010 Document Revised: 09/07/2017 Document Reviewed: 09/07/2017 Elsevier Patient Education  2020 Elsevier Inc.  Moderate Conscious Sedation, Adult, Care After These instructions provide you with information about caring for yourself after your procedure. Your health care provider may also give you more specific instructions. Your treatment has been planned according to current medical practices, but problems sometimes occur. Call your health care provider if you have any problems or questions after your procedure. What can I expect after the procedure? After your procedure, it is common:  To feel sleepy for several hours.  To feel clumsy and have poor balance for several hours.  To have poor judgment for several hours.  To vomit if you eat too soon. Follow these instructions at home: For at least 24 hours after the procedure:   Do not: ? Participate in activities where you could fall or become injured. ? Drive. ? Use heavy machinery. ? Drink alcohol. ? Take sleeping pills or medicines that cause drowsiness. ? Make important decisions or sign legal documents. ? Take care of children on your own.  Rest. Eating and drinking  Follow the diet recommended by your health care provider.  If you vomit: ? Drink water, juice, or soup when you can drink without vomiting. ? Make sure you have little or no nausea before eating solid foods. General instructions  Have a responsible adult stay with you until you are awake and alert.  Take over-the-counter and prescription medicines only as told by your health care provider.  If you smoke, do not smoke without supervision.  Keep all follow-up visits as told by your health care provider. This is important. Contact a health care  provider if:  You keep feeling nauseous or you keep vomiting.  You feel light-headed.  You develop a rash.  You have a fever. Get help right away if:  You have trouble breathing. This information is not intended to replace advice given to you by your health care provider. Make sure you discuss any questions you have with your health care provider. Document Released: 05/23/2013 Document Revised: 07/15/2017 Document Reviewed: 11/22/2015 Elsevier Patient Education  2020 Elsevier Inc.  

## 2019-04-11 ENCOUNTER — Encounter (HOSPITAL_COMMUNITY): Payer: Self-pay | Admitting: Cardiovascular Disease

## 2019-04-16 ENCOUNTER — Other Ambulatory Visit: Payer: Self-pay

## 2019-04-16 ENCOUNTER — Other Ambulatory Visit: Payer: Medicare (Managed Care)

## 2019-04-16 DIAGNOSIS — I350 Nonrheumatic aortic (valve) stenosis: Secondary | ICD-10-CM

## 2019-04-16 DIAGNOSIS — N289 Disorder of kidney and ureter, unspecified: Secondary | ICD-10-CM

## 2019-04-17 ENCOUNTER — Other Ambulatory Visit: Payer: Self-pay

## 2019-04-17 DIAGNOSIS — I350 Nonrheumatic aortic (valve) stenosis: Secondary | ICD-10-CM

## 2019-04-17 LAB — BASIC METABOLIC PANEL
BUN/Creatinine Ratio: 25 — ABNORMAL HIGH (ref 10–24)
BUN: 49 mg/dL — ABNORMAL HIGH (ref 8–27)
CO2: 22 mmol/L (ref 20–29)
Calcium: 9.4 mg/dL (ref 8.6–10.2)
Chloride: 99 mmol/L (ref 96–106)
Creatinine, Ser: 1.97 mg/dL — ABNORMAL HIGH (ref 0.76–1.27)
GFR calc Af Amer: 38 mL/min/{1.73_m2} — ABNORMAL LOW (ref >59)
GFR calc non Af Amer: 33 mL/min/{1.73_m2} — ABNORMAL LOW (ref >59)
Glucose: 133 mg/dL — ABNORMAL HIGH (ref 65–99)
Potassium: 4.3 mmol/L (ref 3.5–5.2)
Sodium: 139 mmol/L (ref 134–144)

## 2019-04-17 NOTE — Progress Notes (Signed)
  [facility]   MULTIDISCIPLINARY HEART VALVE TEAM  Discussed need for pre TAVR PT assessment for this patient with Dr Burt Knack.  Dr Burt Knack did not feel that the pt was appropriate to attempt PT evaluation. The pt has blindness and per Dr Antionette Char 03/09/2019 office visit note the pt is very limited in mobility, "He is basically nonambulatory but is able to walk a little bit within his house". Due to safety concerns we will not proceed with PT evaluation.

## 2019-04-18 ENCOUNTER — Other Ambulatory Visit: Payer: Self-pay

## 2019-04-18 DIAGNOSIS — N289 Disorder of kidney and ureter, unspecified: Secondary | ICD-10-CM

## 2019-04-18 DIAGNOSIS — I350 Nonrheumatic aortic (valve) stenosis: Secondary | ICD-10-CM

## 2019-04-24 ENCOUNTER — Ambulatory Visit (HOSPITAL_COMMUNITY)
Admission: RE | Admit: 2019-04-24 | Discharge: 2019-04-24 | Disposition: A | Discharge status: Home / Self Care | Payer: Medicare (Managed Care) | Source: Ambulatory Visit | Attending: Cardiovascular Disease | Admitting: Cardiovascular Disease

## 2019-04-24 ENCOUNTER — Other Ambulatory Visit: Payer: Self-pay

## 2019-04-24 ENCOUNTER — Ambulatory Visit (HOSPITAL_BASED_OUTPATIENT_CLINIC_OR_DEPARTMENT_OTHER)
Admission: RE | Admit: 2019-04-24 | Discharge: 2019-04-24 | Disposition: A | Discharge status: Home / Self Care | Payer: Medicare (Managed Care) | Source: Ambulatory Visit | Attending: Cardiovascular Disease | Admitting: Cardiovascular Disease

## 2019-04-24 DIAGNOSIS — I350 Nonrheumatic aortic (valve) stenosis: Secondary | ICD-10-CM

## 2019-04-24 DIAGNOSIS — N289 Disorder of kidney and ureter, unspecified: Secondary | ICD-10-CM | POA: Diagnosis present

## 2019-04-24 MED ORDER — IOHEXOL 350 MG/ML SOLN
100.0000 mL | Freq: Once | INTRAVENOUS | Status: AC | PRN
  Administered 2019-04-24: 100 mL via INTRAVENOUS

## 2019-04-24 MED ORDER — SODIUM BICARBONATE BOLUS VIA INFUSION
INTRAVENOUS | Status: AC
  Administered 2019-04-24: 75 meq via INTRAVENOUS
  Filled 2019-04-24: qty 1

## 2019-04-24 MED ORDER — SODIUM BICARBONATE 8.4 % IV SOLN
INTRAVENOUS | Status: AC
  Administered 2019-04-24: 13:00:00 via INTRAVENOUS
  Filled 2019-04-24: qty 500

## 2019-04-24 NOTE — Progress Notes (Signed)
Carotid artery duplex completed. Refer to "CV Proc" under chart review to view preliminary results.  04/24/2019 4:00 PM Maudry Mayhew, MHA, RVT, RDCS, RDMS

## 2019-04-28 ENCOUNTER — Other Ambulatory Visit: Payer: Self-pay

## 2019-04-28 ENCOUNTER — Encounter (HOSPITAL_COMMUNITY): Payer: Self-pay | Admitting: Emergency Medicine

## 2019-04-28 ENCOUNTER — Emergency Department (HOSPITAL_COMMUNITY): Payer: Medicare (Managed Care)

## 2019-04-28 ENCOUNTER — Inpatient Hospital Stay (HOSPITAL_COMMUNITY)
Admission: EM | Admit: 2019-04-28 | Discharge: 2019-05-11 | DRG: 266 | Disposition: A | Discharge status: Skilled Nursing Facility | Payer: Medicare (Managed Care) | Attending: Cardiology | Admitting: Cardiology

## 2019-04-28 DIAGNOSIS — M109 Gout, unspecified: Secondary | ICD-10-CM | POA: Diagnosis present

## 2019-04-28 DIAGNOSIS — M1711 Unilateral primary osteoarthritis, right knee: Secondary | ICD-10-CM | POA: Diagnosis present

## 2019-04-28 DIAGNOSIS — G4733 Obstructive sleep apnea (adult) (pediatric): Secondary | ICD-10-CM | POA: Diagnosis not present

## 2019-04-28 DIAGNOSIS — I350 Nonrheumatic aortic (valve) stenosis: Secondary | ICD-10-CM

## 2019-04-28 DIAGNOSIS — I272 Pulmonary hypertension, unspecified: Secondary | ICD-10-CM | POA: Diagnosis present

## 2019-04-28 DIAGNOSIS — N400 Enlarged prostate without lower urinary tract symptoms: Secondary | ICD-10-CM | POA: Diagnosis present

## 2019-04-28 DIAGNOSIS — I129 Hypertensive chronic kidney disease with stage 1 through stage 4 chronic kidney disease, or unspecified chronic kidney disease: Secondary | ICD-10-CM | POA: Diagnosis not present

## 2019-04-28 DIAGNOSIS — L97119 Non-pressure chronic ulcer of right thigh with unspecified severity: Secondary | ICD-10-CM | POA: Diagnosis present

## 2019-04-28 DIAGNOSIS — Z006 Encounter for examination for normal comparison and control in clinical research program: Secondary | ICD-10-CM

## 2019-04-28 DIAGNOSIS — E11622 Type 2 diabetes mellitus with other skin ulcer: Secondary | ICD-10-CM | POA: Diagnosis present

## 2019-04-28 DIAGNOSIS — Z952 Presence of prosthetic heart valve: Secondary | ICD-10-CM | POA: Diagnosis not present

## 2019-04-28 DIAGNOSIS — E1122 Type 2 diabetes mellitus with diabetic chronic kidney disease: Secondary | ICD-10-CM | POA: Diagnosis present

## 2019-04-28 DIAGNOSIS — E662 Morbid (severe) obesity with alveolar hypoventilation: Secondary | ICD-10-CM | POA: Diagnosis present

## 2019-04-28 DIAGNOSIS — H548 Legal blindness, as defined in USA: Secondary | ICD-10-CM | POA: Diagnosis present

## 2019-04-28 DIAGNOSIS — Z794 Long term (current) use of insulin: Secondary | ICD-10-CM

## 2019-04-28 DIAGNOSIS — I5033 Acute on chronic diastolic (congestive) heart failure: Secondary | ICD-10-CM | POA: Diagnosis not present

## 2019-04-28 DIAGNOSIS — N179 Acute kidney failure, unspecified: Secondary | ICD-10-CM | POA: Diagnosis present

## 2019-04-28 DIAGNOSIS — Z950 Presence of cardiac pacemaker: Secondary | ICD-10-CM

## 2019-04-28 DIAGNOSIS — Z9989 Dependence on other enabling machines and devices: Secondary | ICD-10-CM | POA: Diagnosis not present

## 2019-04-28 DIAGNOSIS — I447 Left bundle-branch block, unspecified: Secondary | ICD-10-CM | POA: Diagnosis present

## 2019-04-28 DIAGNOSIS — Z8249 Family history of ischemic heart disease and other diseases of the circulatory system: Secondary | ICD-10-CM

## 2019-04-28 DIAGNOSIS — I5031 Acute diastolic (congestive) heart failure: Secondary | ICD-10-CM | POA: Diagnosis not present

## 2019-04-28 DIAGNOSIS — Z87891 Personal history of nicotine dependence: Secondary | ICD-10-CM

## 2019-04-28 DIAGNOSIS — K219 Gastro-esophageal reflux disease without esophagitis: Secondary | ICD-10-CM | POA: Diagnosis present

## 2019-04-28 DIAGNOSIS — Z89432 Acquired absence of left foot: Secondary | ICD-10-CM

## 2019-04-28 DIAGNOSIS — B009 Herpesviral infection, unspecified: Secondary | ICD-10-CM | POA: Diagnosis present

## 2019-04-28 DIAGNOSIS — E785 Hyperlipidemia, unspecified: Secondary | ICD-10-CM | POA: Diagnosis present

## 2019-04-28 DIAGNOSIS — Z79899 Other long term (current) drug therapy: Secondary | ICD-10-CM

## 2019-04-28 DIAGNOSIS — I5043 Acute on chronic combined systolic (congestive) and diastolic (congestive) heart failure: Secondary | ICD-10-CM | POA: Diagnosis present

## 2019-04-28 DIAGNOSIS — I480 Paroxysmal atrial fibrillation: Secondary | ICD-10-CM | POA: Diagnosis present

## 2019-04-28 DIAGNOSIS — I509 Heart failure, unspecified: Secondary | ICD-10-CM

## 2019-04-28 DIAGNOSIS — Z6841 Body Mass Index (BMI) 40.0 and over, adult: Secondary | ICD-10-CM

## 2019-04-28 DIAGNOSIS — I10 Essential (primary) hypertension: Secondary | ICD-10-CM | POA: Diagnosis not present

## 2019-04-28 DIAGNOSIS — I2510 Atherosclerotic heart disease of native coronary artery without angina pectoris: Secondary | ICD-10-CM | POA: Diagnosis present

## 2019-04-28 DIAGNOSIS — Z7982 Long term (current) use of aspirin: Secondary | ICD-10-CM

## 2019-04-28 DIAGNOSIS — Z20828 Contact with and (suspected) exposure to other viral communicable diseases: Secondary | ICD-10-CM | POA: Diagnosis present

## 2019-04-28 DIAGNOSIS — E1142 Type 2 diabetes mellitus with diabetic polyneuropathy: Secondary | ICD-10-CM | POA: Diagnosis present

## 2019-04-28 DIAGNOSIS — R001 Bradycardia, unspecified: Secondary | ICD-10-CM | POA: Diagnosis not present

## 2019-04-28 DIAGNOSIS — N184 Chronic kidney disease, stage 4 (severe): Secondary | ICD-10-CM | POA: Diagnosis present

## 2019-04-28 LAB — CBC WITH DIFFERENTIAL/PLATELET
Abs Immature Granulocytes: 0.06 10*3/uL (ref 0.00–0.07)
Basophils Absolute: 0 10*3/uL (ref 0.0–0.1)
Basophils Relative: 0 %
Eosinophils Absolute: 0.2 10*3/uL (ref 0.0–0.5)
Eosinophils Relative: 2 %
HCT: 38 % — ABNORMAL LOW (ref 39.0–52.0)
Hemoglobin: 11.4 g/dL — ABNORMAL LOW (ref 13.0–17.0)
Immature Granulocytes: 1 %
Lymphocytes Relative: 12 %
Lymphs Abs: 1.3 10*3/uL (ref 0.7–4.0)
MCH: 25.2 pg — ABNORMAL LOW (ref 26.0–34.0)
MCHC: 30 g/dL (ref 30.0–36.0)
MCV: 83.9 fL (ref 80.0–100.0)
Monocytes Absolute: 0.9 10*3/uL (ref 0.1–1.0)
Monocytes Relative: 9 %
Neutro Abs: 8.2 10*3/uL — ABNORMAL HIGH (ref 1.7–7.7)
Neutrophils Relative %: 76 %
Platelets: 190 10*3/uL (ref 150–400)
RBC: 4.53 MIL/uL (ref 4.22–5.81)
RDW: 15.7 % — ABNORMAL HIGH (ref 11.5–15.5)
WBC: 10.8 10*3/uL — ABNORMAL HIGH (ref 4.0–10.5)
nRBC: 0 % (ref 0.0–0.2)

## 2019-04-28 LAB — COMPREHENSIVE METABOLIC PANEL
ALT: 23 U/L (ref 0–44)
AST: 14 U/L — ABNORMAL LOW (ref 15–41)
Albumin: 3.1 g/dL — ABNORMAL LOW (ref 3.5–5.0)
Alkaline Phosphatase: 82 U/L (ref 38–126)
Anion gap: 10 (ref 5–15)
BUN: 66 mg/dL — ABNORMAL HIGH (ref 8–23)
CO2: 22 mmol/L (ref 22–32)
Calcium: 8.8 mg/dL — ABNORMAL LOW (ref 8.9–10.3)
Chloride: 107 mmol/L (ref 98–111)
Creatinine, Ser: 2.78 mg/dL — ABNORMAL HIGH (ref 0.61–1.24)
GFR calc Af Amer: 25 mL/min — ABNORMAL LOW (ref >60)
GFR calc non Af Amer: 22 mL/min — ABNORMAL LOW (ref >60)
Glucose, Bld: 143 mg/dL — ABNORMAL HIGH (ref 70–99)
Potassium: 5 mmol/L (ref 3.5–5.1)
Sodium: 139 mmol/L (ref 135–145)
Total Bilirubin: 0.6 mg/dL (ref 0.3–1.2)
Total Protein: 7.3 g/dL (ref 6.5–8.1)

## 2019-04-28 LAB — POCT I-STAT EG7
Acid-Base Excess: 1 mmol/L (ref 0.0–2.0)
Bicarbonate: 27.2 mmol/L (ref 20.0–28.0)
Calcium, Ion: 1.18 mmol/L (ref 1.15–1.40)
HCT: 39 % (ref 39.0–52.0)
Hemoglobin: 13.3 g/dL (ref 13.0–17.0)
O2 Saturation: 98 %
Potassium: 4.9 mmol/L (ref 3.5–5.1)
Sodium: 142 mmol/L (ref 135–145)
TCO2: 29 mmol/L (ref 22–32)
pCO2, Ven: 50.9 mmHg (ref 44.0–60.0)
pH, Ven: 7.335 (ref 7.250–7.430)
pO2, Ven: 108 mmHg — ABNORMAL HIGH (ref 32.0–45.0)

## 2019-04-28 LAB — ECHOCARDIOGRAM COMPLETE
Height: 72 in
Weight: 4976.01 oz

## 2019-04-28 LAB — TROPONIN I (HIGH SENSITIVITY)
Troponin I (High Sensitivity): 30 ng/L — ABNORMAL HIGH (ref <18)
Troponin I (High Sensitivity): 32 ng/L — ABNORMAL HIGH (ref <18)

## 2019-04-28 LAB — D-DIMER, QUANTITATIVE: D-Dimer, Quant: 0.99 ug/mL-FEU — ABNORMAL HIGH (ref 0.00–0.50)

## 2019-04-28 LAB — BRAIN NATRIURETIC PEPTIDE: B Natriuretic Peptide: 140.8 pg/mL — ABNORMAL HIGH (ref 0.0–100.0)

## 2019-04-28 LAB — MRSA PCR SCREENING: MRSA by PCR: NEGATIVE

## 2019-04-28 LAB — SARS CORONAVIRUS 2 BY RT PCR (HOSPITAL ORDER, PERFORMED IN ~~LOC~~ HOSPITAL LAB): SARS Coronavirus 2: NEGATIVE

## 2019-04-28 LAB — GLUCOSE, CAPILLARY: Glucose-Capillary: 129 mg/dL — ABNORMAL HIGH (ref 70–99)

## 2019-04-28 LAB — TSH: TSH: 2.939 u[IU]/mL (ref 0.350–4.500)

## 2019-04-28 MED ORDER — ALBUTEROL SULFATE (2.5 MG/3ML) 0.083% IN NEBU: 3.0000 mL | INHALATION_SOLUTION | Freq: Four times a day (QID) | RESPIRATORY_TRACT | Status: DC | PRN

## 2019-04-28 MED ORDER — CHLORHEXIDINE GLUCONATE CLOTH 2 % EX PADS
6.0000 | MEDICATED_PAD | Freq: Every day | CUTANEOUS | Status: DC
  Administered 2019-04-28 – 2019-05-02 (×5): 6 via TOPICAL

## 2019-04-28 MED ORDER — AMLODIPINE BESYLATE 10 MG PO TABS
10.0000 mg | ORAL_TABLET | Freq: Every day | ORAL | Status: DC
  Administered 2019-04-29 – 2019-05-11 (×13): 10 mg via ORAL
  Filled 2019-04-28 (×13): qty 1

## 2019-04-28 MED ORDER — LIRAGLUTIDE 18 MG/3ML ~~LOC~~ SOPN: 1.8000 mg | PEN_INJECTOR | Freq: Every day | SUBCUTANEOUS | Status: DC

## 2019-04-28 MED ORDER — HYDRALAZINE HCL 50 MG PO TABS
50.0000 mg | ORAL_TABLET | Freq: Three times a day (TID) | ORAL | Status: DC
  Administered 2019-04-28 – 2019-05-02 (×13): 50 mg via ORAL
  Filled 2019-04-28 (×13): qty 1

## 2019-04-28 MED ORDER — FEBUXOSTAT 40 MG PO TABS
80.0000 mg | ORAL_TABLET | Freq: Every day | ORAL | Status: DC
  Administered 2019-04-29 – 2019-05-11 (×12): 80 mg via ORAL
  Filled 2019-04-28 (×13): qty 2

## 2019-04-28 MED ORDER — METOPROLOL SUCCINATE ER 50 MG PO TB24
50.0000 mg | ORAL_TABLET | Freq: Every day | ORAL | Status: DC
  Administered 2019-04-29 – 2019-05-11 (×13): 50 mg via ORAL
  Filled 2019-04-28 (×13): qty 1

## 2019-04-28 MED ORDER — VITAMIN D 25 MCG (1000 UNIT) PO TABS
1000.0000 [IU] | ORAL_TABLET | Freq: Every day | ORAL | Status: DC
  Administered 2019-04-29 – 2019-05-11 (×13): 1000 [IU] via ORAL
  Filled 2019-04-28 (×14): qty 1

## 2019-04-28 MED ORDER — FUROSEMIDE 10 MG/ML IJ SOLN
120.0000 mg | Freq: Three times a day (TID) | INTRAVENOUS | Status: DC
  Administered 2019-04-29 – 2019-05-02 (×10): 120 mg via INTRAVENOUS
  Filled 2019-04-28: qty 12
  Filled 2019-04-28 (×2): qty 10
  Filled 2019-04-28: qty 12
  Filled 2019-04-28 (×3): qty 10
  Filled 2019-04-28: qty 2
  Filled 2019-04-28 (×2): qty 12
  Filled 2019-04-28: qty 10
  Filled 2019-04-28: qty 12
  Filled 2019-04-28 (×2): qty 10

## 2019-04-28 MED ORDER — ONDANSETRON HCL 4 MG/2ML IJ SOLN
4.0000 mg | Freq: Four times a day (QID) | INTRAMUSCULAR | Status: DC | PRN
  Administered 2019-05-08: 4 mg via INTRAVENOUS

## 2019-04-28 MED ORDER — INSULIN GLARGINE 100 UNIT/ML ~~LOC~~ SOLN
90.0000 [IU] | Freq: Every day | SUBCUTANEOUS | Status: DC
  Administered 2019-04-28 – 2019-04-29 (×2): 90 [IU] via SUBCUTANEOUS
  Filled 2019-04-28 (×5): qty 0.9

## 2019-04-28 MED ORDER — FAMOTIDINE 20 MG PO TABS
20.0000 mg | ORAL_TABLET | Freq: Every day | ORAL | Status: DC
  Administered 2019-04-29 – 2019-05-11 (×13): 20 mg via ORAL
  Filled 2019-04-28 (×13): qty 1

## 2019-04-28 MED ORDER — INSULIN ASPART 100 UNIT/ML ~~LOC~~ SOLN
0.0000 [IU] | Freq: Three times a day (TID) | SUBCUTANEOUS | Status: DC
  Administered 2019-04-29: 4 [IU] via SUBCUTANEOUS
  Administered 2019-04-30: 3 [IU] via SUBCUTANEOUS
  Administered 2019-04-30: 7 [IU] via SUBCUTANEOUS
  Administered 2019-05-01: 4 [IU] via SUBCUTANEOUS
  Administered 2019-05-01: 3 [IU] via SUBCUTANEOUS
  Administered 2019-05-02 – 2019-05-04 (×6): 4 [IU] via SUBCUTANEOUS
  Administered 2019-05-05 (×2): 3 [IU] via SUBCUTANEOUS
  Administered 2019-05-05: 4 [IU] via SUBCUTANEOUS
  Administered 2019-05-06 (×3): 7 [IU] via SUBCUTANEOUS
  Administered 2019-05-07: 4 [IU] via SUBCUTANEOUS
  Administered 2019-05-07: 3 [IU] via SUBCUTANEOUS
  Administered 2019-05-07: 7 [IU] via SUBCUTANEOUS
  Administered 2019-05-09: 11 [IU] via SUBCUTANEOUS
  Administered 2019-05-09: 4 [IU] via SUBCUTANEOUS
  Administered 2019-05-09: 7 [IU] via SUBCUTANEOUS
  Administered 2019-05-10 (×2): 11 [IU] via SUBCUTANEOUS
  Administered 2019-05-10 – 2019-05-11 (×2): 4 [IU] via SUBCUTANEOUS
  Administered 2019-05-11: 15 [IU] via SUBCUTANEOUS

## 2019-04-28 MED ORDER — SENNOSIDES-DOCUSATE SODIUM 8.6-50 MG PO TABS
2.0000 | ORAL_TABLET | Freq: Two times a day (BID) | ORAL | Status: DC
  Administered 2019-04-28 – 2019-05-11 (×23): 2 via ORAL
  Filled 2019-04-28 (×24): qty 2

## 2019-04-28 MED ORDER — CITALOPRAM HYDROBROMIDE 20 MG PO TABS
20.0000 mg | ORAL_TABLET | Freq: Every day | ORAL | Status: DC
  Administered 2019-04-29 – 2019-05-11 (×13): 20 mg via ORAL
  Filled 2019-04-28 (×13): qty 1

## 2019-04-28 MED ORDER — ROSUVASTATIN CALCIUM 20 MG PO TABS
20.0000 mg | ORAL_TABLET | Freq: Every day | ORAL | Status: DC
  Administered 2019-04-28 – 2019-05-11 (×14): 20 mg via ORAL
  Filled 2019-04-28 (×14): qty 1

## 2019-04-28 MED ORDER — FUROSEMIDE 10 MG/ML IJ SOLN
80.0000 mg | INTRAMUSCULAR | Status: AC
  Administered 2019-04-28: 80 mg via INTRAVENOUS
  Filled 2019-04-28: qty 8

## 2019-04-28 MED ORDER — SODIUM CHLORIDE 0.9% FLUSH
3.0000 mL | Freq: Two times a day (BID) | INTRAVENOUS | Status: DC
  Administered 2019-04-28 – 2019-05-07 (×17): 3 mL via INTRAVENOUS

## 2019-04-28 MED ORDER — BIOTENE DRY MOUTH MT LIQD: 15.0000 mL | Freq: Three times a day (TID) | OROMUCOSAL | Status: DC | PRN

## 2019-04-28 MED ORDER — SODIUM CHLORIDE 0.9% FLUSH
3.0000 mL | INTRAVENOUS | Status: DC | PRN
  Administered 2019-04-29: 3 mL via INTRAVENOUS
  Filled 2019-04-28: qty 3

## 2019-04-28 MED ORDER — HEPARIN SODIUM (PORCINE) 5000 UNIT/ML IJ SOLN
5000.0000 [IU] | Freq: Three times a day (TID) | INTRAMUSCULAR | Status: DC
  Administered 2019-04-28 – 2019-05-09 (×25): 5000 [IU] via SUBCUTANEOUS
  Filled 2019-04-28 (×25): qty 1

## 2019-04-28 MED ORDER — GABAPENTIN 300 MG PO CAPS
300.0000 mg | ORAL_CAPSULE | Freq: Two times a day (BID) | ORAL | Status: DC
  Administered 2019-04-28 – 2019-05-11 (×25): 300 mg via ORAL
  Filled 2019-04-28 (×25): qty 1

## 2019-04-28 MED ORDER — ASPIRIN EC 81 MG PO TBEC
81.0000 mg | DELAYED_RELEASE_TABLET | Freq: Every day | ORAL | Status: DC
  Administered 2019-04-29 – 2019-05-11 (×13): 81 mg via ORAL
  Filled 2019-04-28 (×13): qty 1

## 2019-04-28 MED ORDER — POLYETHYLENE GLYCOL 3350 17 G PO PACK: 17.0000 g | PACK | Freq: Every day | ORAL | Status: DC | PRN

## 2019-04-28 MED ORDER — SODIUM CHLORIDE 0.9 % IV SOLN
250.0000 mL | INTRAVENOUS | Status: DC | PRN
  Administered 2019-05-08: 14:00:00 via INTRAVENOUS

## 2019-04-28 MED ORDER — ORAL CARE MOUTH RINSE
15.0000 mL | Freq: Two times a day (BID) | OROMUCOSAL | Status: DC
  Administered 2019-04-29 – 2019-05-11 (×17): 15 mL via OROMUCOSAL

## 2019-04-28 MED ORDER — ACETAMINOPHEN 325 MG PO TABS: 650.0000 mg | ORAL_TABLET | ORAL | Status: DC | PRN

## 2019-04-28 NOTE — ED Provider Notes (Signed)
[facility] EMERGENCY DEPARTMENT Provider Note   CSN: [id number] Arrival date & time: 04/28/19  2119     History   Chief Complaint Chief Complaint  Patient presents with  . Shortness of Breath    HPI Hilman Kissling is a 73 y.o. male.     73yo M w/ extensive PMH including CAD, T2DM, A fib, HTN, HLD, OSA, pacemaker, severe aortic stenosis who p/w shortness of breath.  3 days ago he began feeling more short of breath than usual and this has progressively worsened.  He endorses orthopnea, paroxysmal nocturnal dyspnea, and weight gain.  He denies any chest pain, fevers, or cough.  His leg swelling is at baseline.  He is scheduled for a TAVR later this month.  The history is provided by the patient and the EMS personnel.    Past Medical History:  Diagnosis Date  . Arthritis    "all over" (06/23/2013)  . Atrial fibrillation ([place])    not Candidate for Coumadin d/t rectal bleeding  . Benign prostatic hypertrophy   . Cardiomyopathy, hypertensive ([place])     diastolic dysfunction by 4174 echo  . Coronary artery disease     nonobstructive. 2 vessel. cardiac catheter in March 2005 showing 60% LAD occlusion, 30% circumflex occlusion.  . Degenerative joint disease     Right knee.  . Diabetes type 2, uncontrolled ([place])   . Diabetic peripheral neuropathy ([place])   . Diabetic ulcer of thigh ([place])    H/O Rt thigh ulcer.  . Exertional shortness of breath    "comes and goes" (06/23/2013)  . GERD (gastroesophageal reflux disease)   . Gout   . Herpes   . Hyperlipidemia   . Hypertension   . Hypotension 09/05/2013  . Incarcerated ventral hernia    H/O. S/P repair ventral hernia repair 07/2005.  Marland Kitchen LBBB (left bundle branch block)   . Legally blind   . Morbid obesity ([place])   . Obstructive sleep apnea    On CPAP. Last sleep study (06/2009) - Severe obstructive sleep apnea/hypopnea syndrome, apnea-hypopnea index 70.5 per hour with non positional events moderately loud snoring  and oxygen desaturation to a nadir of 85% on room air.  . Occult blood positive stool     History of in August 2007.  colonoscopy in January 2008 showing normal colon with fair inadequate prep. By Dr. Silvano Rusk.  . 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 Sepsis ([place]) 08/26/2013    Patient Active Problem List   Diagnosis Date Noted  . Acute on chronic congestive heart failure ([place])   . Heart failure ([place]) 08/31/2018  . Paroxysmal atrial tachycardia ([place]) 05/12/2018  . Aortic valve stenosis, nonrheumatic 05/12/2018  . Diabetes mellitus type 2 in obese ([place]) 05/12/2018  . Chronic diastolic heart failure ([place]) 11/11/2017  . Hypernatremia 08/15/2017  . AKI (acute kidney injury) ([place]) 08/15/2017  . Acute lower UTI 08/15/2017  . Coarse tremors 08/15/2017  . Amiodarone pulmonary toxicity   . Cellulitis   . Acute encephalopathy   . Acute pulmonary edema (HCC)   . Acute respiratory failure with hypoxia ([place]) 06/13/2017  . SSS (sick sinus syndrome) ([place]) 05/19/2016  . Cough 08/20/2015  . Morbid obesity ([place]) 07/09/2015  . Cardiac mass 10/04/2013  . CVA (cerebrovascular accident due to intracerebral hemorrhage) ([place]) 10/02/2013  . CVA (cerebral infarction) 10/01/2013  . History of intracranial hemorrhage 10/01/2013  . ICH (intracerebral hemorrhage) ([place]) 10/01/2013  . Right knee pain 09/07/2013  . CKD (  chronic kidney disease) stage 3, GFR 30-59 ml/min (HCC) 09/07/2013  . Orchitis 09/05/2013  . Unspecified protein-calorie malnutrition ([place]) 09/05/2013  . Sepsis ([place]) 08/27/2013  . Trichomonas infection 08/27/2013  . Epididymo-orchitis, acute 08/27/2013  . PAF (paroxysmal atrial fibrillation) ([place]) 06/24/2013  . OSA (obstructive sleep apnea) 05/04/2011  . Shortness of breath 03/17/2011  . Depression 10/23/2010  . Degenerative joint disease   . Cardiomyopathy, hypertensive ([place])   . Coronary artery disease involving native coronary artery of native heart without angina  pectoris   . Pacemaker   . Incarcerated ventral hernia   . COPD (chronic obstructive pulmonary disease) ([place]) 11/28/2009  . Diabetes mellitus due to underlying condition, controlled, with stage 3 chronic kidney disease, with long-term current use of insulin ([place]) 07/20/2006  . Hypercholesterolemia 07/20/2006  . GOUT 07/20/2006  . Macular degeneration (senile) of retina 07/20/2006  . Essential hypertension 07/20/2006  . BENIGN PROSTATIC HYPERTROPHY, HX OF 07/20/2006    Past Surgical History:  Procedure Laterality Date  . AMPUTATION Left 06/22/2013   Procedure: AMPUTATION FOOT;  Surgeon: Newt Minion, MD;  Location: [place];  Service: Orthopedics;  Laterality: Left;  Left Midfoot Amputation  . CARDIAC CATHETERIZATION  10/22/2003   Dilatation of the LV with preserved LV systolic function. Tortous aorta, but no AAA. Moderate disease with 50-60% area of narrowing in the circumflex after the second OM and 70% mid narrowing in the LAD. No intervention.  Marland Kitchen CARDIOVASCULAR STRESS TEST  01/23/2010   Perfusion defect seen in inferior myocardial region is consistent with diaphragmatic attenuation. Remaining myocardium demonstrates normal myocardial perfusion with no evidence of ischemia or infarct. Stress ECG was non-diagnostic due to LBBB.  Marland Kitchen CATARACT EXTRACTION, BILATERAL Bilateral   . ESOPHAGOGASTRODUODENOSCOPY N/A 10/12/2013   Procedure: ESOPHAGOGASTRODUODENOSCOPY (EGD);  Surgeon: Gwenyth Ober, MD;  Location: [facility];  Service: General;  Laterality: N/A;  bedside  . EYE SURGERY    . 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 HERNIA REPAIR    . INSERT / REPLACE / REMOVE PACEMAKER  2003/11/08   Secondary to symptomatic bradycardia. DDDR pacemaker. By Dr. Rollene Fare.  Randolm Idol / REPLACE / REMOVE PACEMAKER  07-Nov-2012   "old pacemaker died; had new one put in" (2013-06-25)  . Pacemaker lead revision  12/2003    likely microdislodgment  of right ventricular lead  . PEG  PLACEMENT N/A 10/12/2013   Procedure: PERCUTANEOUS ENDOSCOPIC GASTROSTOMY (PEG) PLACEMENT;  Surgeon: Gwenyth Ober, MD;  Location: [facility];  Service: General;  Laterality: N/A;  . PERMANENT PACEMAKER GENERATOR CHANGE N/A 11/03/2012   Procedure: PERMANENT PACEMAKER GENERATOR CHANGE;  Surgeon: Sanda Klein, MD;  Location: [facility] CATH LAB;  Service: Cardiovascular;  Laterality: N/A;  . REFRACTIVE SURGERY Bilateral   . RIGHT HEART CATH N/A 09/05/2018   Procedure: RIGHT HEART CATH;  Surgeon: Larey Dresser, MD;  Location: [facility] CV LAB;  Service: Cardiovascular;  Laterality: N/A;  . RIGHT/LEFT HEART CATH AND CORONARY ANGIOGRAPHY N/A 04/10/2019   Procedure: RIGHT/LEFT HEART CATH AND CORONARY ANGIOGRAPHY;  Surgeon: [name], MD;  Location: [facility] CV LAB;  Service: Cardiovascular;  Laterality: N/A;  . TEE WITHOUT CARDIOVERSION N/A 09/05/2018   Procedure: TRANSESOPHAGEAL ECHOCARDIOGRAM (TEE);  Surgeon: Larey Dresser, MD;  Location: [facility]COPY;  Service: Cardiovascular;  Laterality: N/A;  . TRANSTHORACIC ECHOCARDIOGRAM  01/01/2013   EF 50-55%, ventricular septum-thickness moderately increased  . VENOUS DOPPLER  01/17/2013   No evidence of thrombus or thrombophlebitis. Left GSVs appear  enlarged and demonstrate valvular insuffiiciency. Bilateral SSVs appeared patent with no valvular insufficiency seen.  Marland Kitchen VENTRAL HERNIA REPAIR  07/2005    S/P laparotomy, lysis of adhesions, repair of incarcerated ventral hernia with  partial omentectomy.. Performed by Dr. Marlou Starks.        Home Medications    Prior to Admission medications   Medication Sig Start Date End Date Taking? Authorizing Provider  acetaminophen (TYLENOL) 650 MG CR tablet Take 1,300 mg by mouth 2 (two) times daily.    Yes [provider]  albuterol (PROVENTIL HFA;VENTOLIN HFA) 108 (90 Base) MCG/ACT inhaler Inhale 2 puffs into the lungs every 6 (six) hours as needed for wheezing or shortness of breath.   Yes [provider]  amLODipine (NORVASC) 10 MG tablet Take 10 mg by mouth daily. Reported on 12/09/2015   Yes [provider]  antiseptic oral rinse (BIOTENE) LIQD 15 mLs by Mouth Rinse route 3 (three) times daily as needed for dry mouth.    Yes [provider]  aspirin EC 81 MG tablet Take 1 tablet (81 mg total) by mouth daily. 01/19/19  Yes Clegg, Amy D, NP  cholecalciferol (VITAMIN D) 1000 units tablet Take 1,000 Units by mouth daily.   Yes [provider]  citalopram (CELEXA) 20 MG tablet Take 20 mg by mouth daily.   Yes [provider]  famotidine (PEPCID) 20 MG tablet Take 20 mg by mouth daily.   Yes [provider]  Febuxostat (ULORIC) 80 MG TABS Take 80 mg by mouth daily.    Yes [provider]  gabapentin (NEURONTIN) 300 MG capsule Take 300 mg by mouth 2 (two) times daily.   Yes [provider]  hydrALAZINE (APRESOLINE) 50 MG tablet Take 1 tablet (50 mg total) 3 (three) times daily by mouth. 06/24/17  Yes Helberg, Larkin Ina, MD  insulin glargine (LANTUS) 100 UNIT/ML injection Inject 0.25 mLs (25 Units total) into the skin at bedtime. Patient taking differently: Inject 90 Units into the skin at bedtime.  09/08/18  Yes Mariel Aloe, MD  Liraglutide (VICTOZA) 18 MG/3ML SOPN Inject 1.8 mg into the skin daily.   Yes [provider]  Menthol, Topical Analgesic, (BIOFREEZE EX) Apply 1 application topically 2 (two) times daily as needed (knee/shoulder pain).    Yes [provider]  metolazone (ZAROXOLYN) 2.5 MG tablet Take 1 tablet (2.5 mg total) by mouth every other day. Patient taking differently: Take 2.5 mg by mouth daily.  09/10/18  Yes Mariel Aloe, MD  metoprolol succinate (TOPROL-XL) 50 MG 24 hr tablet Take 1 tablet (50 mg total) by mouth daily. Take with or immediately following a meal. 03/21/18  Yes Larey Dresser, MD  polyethylene glycol [facility] Floria Raveling) packet Take 17 g by mouth daily as needed for moderate  constipation.   Yes [provider]  potassium chloride SA (K-DUR) 20 MEQ tablet Take 5 tablets (100 mEq total) by mouth daily. Patient taking differently: Take 100 mEq by mouth 2 (two) times daily.  01/19/19  Yes Clegg, Amy D, NP  rosuvastatin (CRESTOR) 20 MG tablet Take 20 mg by mouth daily.   Yes [provider]  sennosides-docusate sodium (SENOKOT-S) 8.6-50 MG tablet Take 2 tablets by mouth 2 (two) times daily.    Yes [provider]  Skin Protectants, Misc. (EUCERIN) cream Apply 1 application topically 2 (two) times daily.   Yes [provider]  torsemide (DEMADEX) 100 MG tablet Take 100 mg by mouth 2 (two)  times daily.   Yes [provider]  torsemide (DEMADEX) 20 MG tablet Take 6 tablets (120 mg total) by mouth 2 (two) times daily. Patient taking differently: Take 20 mg by mouth 2 (two) times daily.  09/08/18  Yes Mariel Aloe, MD    Family History Family History  Problem Relation Age of Onset  . Coronary artery disease Sister   . Heart disease Sister   . Allergies Sister   . Asthma Brother   . Rheum arthritis Brother     Social History Social History   Tobacco Use  . Smoking status: Former Smoker    Packs/day: 0.00    Years: 0.00    Pack years: 0.00    Types: Cigarettes    Quit date: 08/17/2003    Years since quitting: 15.7  . Smokeless tobacco: Never Used  . Tobacco comment: started at age 83.  only smoked on occ- very rarely.0  Substance Use Topics  . Alcohol use: Yes    Comment: 06/23/2013 "drank some; never had a problem w/it"  . Drug use: No     Allergies   Patient has no known allergies.   Review of Systems Review of Systems All other systems reviewed and are negative except that which was mentioned in HPI   Physical Exam Updated Vital Signs BP 124/69   Pulse (!) 57   Temp 98.1 F (36.7 C) (Oral)   Resp 20   Ht 6' (1.829 m)   Wt (!) 141.1 kg   SpO2 96%   BMI 42.18 kg/m   Physical Exam Vitals signs  and nursing note reviewed.  Constitutional:      General: He is not in acute distress.    Appearance: He is well-developed.  HENT:     Head: Normocephalic and atraumatic.  Eyes:     Conjunctiva/sclera: Conjunctivae normal.     Pupils: Pupils are equal, round, and reactive to light.  Neck:     Musculoskeletal: Neck supple.  Cardiovascular:     Rate and Rhythm: Normal rate and regular rhythm.     Heart sounds: Murmur present.  Pulmonary:     Effort: Tachypnea present.     Comments: Tachypnea with mildly increased work of breathing, diminished breath sounds bilaterally Abdominal:     General: Bowel sounds are normal. There is no distension.     Palpations: Abdomen is soft.     Tenderness: There is no abdominal tenderness.  Musculoskeletal:     Right lower leg: Edema present.     Left lower leg: Edema present.  Skin:    General: Skin is warm and dry.  Neurological:     Mental Status: He is alert and oriented to person, place, and time.     Comments: Fluent speech  Psychiatric:        Behavior: Behavior normal.        Judgment: Judgment normal.      ED Treatments / Results  Labs (all labs ordered are listed, but only abnormal results are displayed) Labs Reviewed  COMPREHENSIVE METABOLIC PANEL - Abnormal; Notable for the following components:      Result Value   Glucose, Bld 143 (*)    BUN 66 (*)    Creatinine, Ser 2.78 (*)    Calcium 8.8 (*)    Albumin 3.1 (*)    AST 14 (*)    GFR calc non Af Amer 22 (*)    GFR calc Af Amer 25 (*)    All other  components within normal limits  CBC WITH DIFFERENTIAL/PLATELET - Abnormal; Notable for the following components:   WBC 10.8 (*)    Hemoglobin 11.4 (*)    HCT 38.0 (*)    MCH 25.2 (*)    RDW 15.7 (*)    Neutro Abs 8.2 (*)    All other components within normal limits  BRAIN NATRIURETIC PEPTIDE - Abnormal; Notable for the following components:   B Natriuretic Peptide 140.8 (*)    All other components within normal limits   POCT I-STAT EG7 - Abnormal; Notable for the following components:   pO2, Ven 108.0 (*)    All other components within normal limits  TROPONIN I (HIGH SENSITIVITY) - Abnormal; Notable for the following components:   Troponin I (High Sensitivity) 30 (*)    All other components within normal limits  TROPONIN I (HIGH SENSITIVITY) - Abnormal; Notable for the following components:   Troponin I (High Sensitivity) 32 (*)    All other components within normal limits  SARS CORONAVIRUS 2 (HOSPITAL ORDER, [facility])  D-DIMER, QUANTITATIVE (NOT AT [facility])  I-STAT VENOUS BLOOD GAS, ED  I-STAT VENOUS BLOOD GAS, ED    EKG EKG Interpretation  Date/Time:  Saturday April 28 2019 10:01:26 EDT Ventricular Rate:  67 PR Interval:    QRS Duration: 216 QT Interval:  494 QTC Calculation: 522 R Axis:   -82 Text Interpretation:  Junctional rhythm Nonspecific IVCD with LAD LVH with secondary repolarization abnormality IVCD is new from previous Confirmed by Theotis Burrow [phone]) on 04/28/2019 10:41:23 AM   Radiology Dg Chest Port 1 View  Result Date: 04/28/2019 CLINICAL DATA:  Shortness of breath since Wednesday. EXAM: PORTABLE CHEST 1 VIEW COMPARISON:  08/31/2018 FINDINGS: Right-sided pacemaker unchanged. Lungs are adequately inflated with minimal prominence of the perihilar vessels suggesting mild vascular congestion. Mild linear atelectasis lateral left base. Stable cardiomegaly. Remainder of the exam is unchanged. IMPRESSION: Cardiomegaly with suggestion of mild vascular congestion. Minimal linear atelectasis left base. Electronically Signed   By: Marin Olp M.D.   On: 04/28/2019 10:53    Procedures Procedures (including critical care time)  Medications Ordered in ED Medications  furosemide (LASIX) injection 80 mg (80 mg Intravenous Given 04/28/19 1313)     Initial Impression / Assessment and Plan / ED Course  I have reviewed the triage vital signs and the nursing  notes.  Pertinent labs & imaging results that were available during my care of the patient were reviewed by me and considered in my medical decision making (see chart for details).        Patient with increased work of breathing and having to sit straight up on initial exam but no severe respiratory distress.  Able to titrate oxygen back down to baseline of 2 L.  Chest x-ray shows cardiomegaly and vascular congestion. Cr elevated at 2.78. BNP 140, HS trop 30 although likely 2/2 demand ischemia from volume overload and AKI. Contacted cardiology and discussed w/ Dr. Radford Pax. I have ordered 80mg  IV lasix and cardiology will admit pt for further care.  Final Clinical Impressions(s) / ED Diagnoses   Final diagnoses:  None    ED Discharge Orders    None       Jeniyah Menor, Wenda Overland, MD 04/28/19 1531

## 2019-04-28 NOTE — Progress Notes (Signed)
  Echocardiogram 2D Echocardiogram has been performed.  Johny Chess 04/28/2019, 4:11 PM

## 2019-04-28 NOTE — ED Notes (Signed)
X-ray at bedside

## 2019-04-28 NOTE — ED Triage Notes (Signed)
Per [facility] pt coming from home, c/o worsening shortness of breath since Wednesday. Patient chronically on 2L [place], fire reports patient on 90% and placed patient on 6L.

## 2019-04-28 NOTE — H&P (Addendum)
Admit date: 04/28/2019 Referring Physician:  Wenda Overland, MD Primary Cardiologist:  [name], MD/Mihai Croitoru, MD/Dalton Aundra Dubin, MD Chief complaint/reason for admission: SOB, CHF  HPI: [name] is a 73 y.o. male who is being seen today for the evaluation of SOB and CHF at the request of Dr. Rex Kras.  [name] is a 73 year old gentleman with chronic kidney disease, morbid obesity, RV dysfunction, and blindness.  He has undergone permanent pacemaker placement in the past. In Jan 2020 he was hospitalized with acute on chronic diastolic CHF in the setting of severe AS.  His hospitalization was complicated by AKI and was felt at that time not to be a good candidate for TAVR due to morbid obesity, poor functional capacity, chronic deconditioning, RV dysfunction and blindness.    He was seen back by Dr. Burt Knack 03/09/2019 in the office and had ongoing SOB.  He was set up for right and left heart catheterization as part of his evaluation on 04/10/2019 showing diffuse calcific non obstuctive CAD and moderately severe AS with moderate PHTN with mean PAP 23mmHg.  He was set up to see Dr. Cyndia Bent in consult for TAVR on Wed of this week but presented to the ER today with complaints of increasing SOB over the past 3 days with 3 pillow orthopnea, having to sit bolt upright even with CPAP and PND.  He has chronic LE edema which appears worse.  He has not had any chest pain.  He says that he has gained weight recently but his weight in July was 317lbs and today is 311lbs.    In ER Cxray showed mild vascular congestion and worsening renal function with creatinine 2.78 (1.97 on 04/16/2019).  Trop mildly elevated at 30 and 32 and Hbg 11.4.  BNP elevated at 140. Cardiology is now asked to admit for treatment of acute CHF.  PMH:    Past Medical History:  Diagnosis Date  . Arthritis    "all over" (06/23/2013)  . Atrial fibrillation ([place])    not Candidate for Coumadin d/t rectal bleeding  . Benign  prostatic hypertrophy   . Cardiomyopathy, hypertensive ([place])     diastolic dysfunction by 0938 echo  . Coronary artery disease     nonobstructive. 2 vessel. cardiac catheter in March 2005 showing 60% LAD occlusion, 30% circumflex occlusion.  . Degenerative joint disease     Right knee.  . Diabetes type 2, uncontrolled ([place])   . Diabetic peripheral neuropathy ([place])   . Diabetic ulcer of thigh ([place])    H/O Rt thigh ulcer.  . Exertional shortness of breath    "comes and goes" (06/23/2013)  . GERD (gastroesophageal reflux disease)   . Gout   . Herpes   . Hyperlipidemia   . Hypertension   . Hypotension 09/05/2013  . Incarcerated ventral hernia    H/O. S/P repair ventral hernia repair 07/2005.  Marland Kitchen LBBB (left bundle branch block)   . Legally blind   . Morbid obesity ([place])   . Obstructive sleep apnea    On CPAP. Last sleep study (06/2009) - Severe obstructive sleep apnea/hypopnea syndrome, apnea-hypopnea index 70.5 per hour with non positional events moderately loud snoring and oxygen desaturation to a nadir of 85% on room air.  . Occult blood positive stool     History of in August 2007.  colonoscopy in January 2008 showing normal colon with fair inadequate prep. By Dr. Silvano Rusk.  . 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 Sepsis ([place]) 08/26/2013  PSH:    Past Surgical History:  Procedure Laterality Date  . AMPUTATION Left 06/22/2013   Procedure: AMPUTATION FOOT;  Surgeon: Newt Minion, MD;  Location: [facility];  Service: Orthopedics;  Laterality: Left;  Left Midfoot Amputation  . CARDIAC CATHETERIZATION  10/22/2003   Dilatation of the LV with preserved LV systolic function. Tortous aorta, but no AAA. Moderate disease with 50-60% area of narrowing in the circumflex after the second OM and 70% mid narrowing in the LAD. No intervention.  Marland Kitchen CARDIOVASCULAR STRESS TEST  01/23/2010   Perfusion defect seen in inferior myocardial region is consistent with diaphragmatic attenuation.  Remaining myocardium demonstrates normal myocardial perfusion with no evidence of ischemia or infarct. Stress ECG was non-diagnostic due to LBBB.  Marland Kitchen CATARACT EXTRACTION, BILATERAL Bilateral   . ESOPHAGOGASTRODUODENOSCOPY N/A 10/12/2013   Procedure: ESOPHAGOGASTRODUODENOSCOPY (EGD);  Surgeon: Gwenyth Ober, MD;  Location: [facility];  Service: General;  Laterality: N/A;  bedside  . EYE SURGERY    . 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 HERNIA REPAIR    . INSERT / REPLACE / REMOVE PACEMAKER  11/17/2003   Secondary to symptomatic bradycardia. DDDR pacemaker. By Dr. Rollene Fare.  Randolm Idol / REPLACE / REMOVE PACEMAKER  2012/11/16   "old pacemaker died; had new one put in" (07/04/13)  . Pacemaker lead revision  12/2003    likely microdislodgment  of right ventricular lead  . PEG PLACEMENT N/A 10/12/2013   Procedure: PERCUTANEOUS ENDOSCOPIC GASTROSTOMY (PEG) PLACEMENT;  Surgeon: Gwenyth Ober, MD;  Location: [facility];  Service: General;  Laterality: N/A;  . PERMANENT PACEMAKER GENERATOR CHANGE N/A 11/03/2012   Procedure: PERMANENT PACEMAKER GENERATOR CHANGE;  Surgeon: Sanda Klein, MD;  Location: [facility] CATH LAB;  Service: Cardiovascular;  Laterality: N/A;  . REFRACTIVE SURGERY Bilateral   . RIGHT HEART CATH N/A 09/05/2018   Procedure: RIGHT HEART CATH;  Surgeon: Larey Dresser, MD;  Location: [facility];  Service: Cardiovascular;  Laterality: N/A;  . RIGHT/LEFT HEART CATH AND CORONARY ANGIOGRAPHY N/A 04/10/2019   Procedure: RIGHT/LEFT HEART CATH AND CORONARY ANGIOGRAPHY;  Surgeon: [name], MD;  Location: [facility];  Service: Cardiovascular;  Laterality: N/A;  . TEE WITHOUT CARDIOVERSION N/A 09/05/2018   Procedure: TRANSESOPHAGEAL ECHOCARDIOGRAM (TEE);  Surgeon: Larey Dresser, MD;  Location[facility] ENDOSCOPY;  Service: Cardiovascular;  Laterality: N/A;  . TRANSTHORACIC ECHOCARDIOGRAM  01/01/2013   EF 50-55%, ventricular septum-thickness  moderately increased  . VENOUS DOPPLER  01/17/2013   No evidence of thrombus or thrombophlebitis. Left GSVs appear enlarged and demonstrate valvular insuffiiciency. Bilateral SSVs appeared patent with no valvular insufficiency seen.  Marland Kitchen VENTRAL HERNIA REPAIR  07/2005    S/P laparotomy, lysis of adhesions, repair of incarcerated ventral hernia with  partial omentectomy.. Performed by Dr. Marlou Starks.    ALLERGIES:   Patient has no known allergies.  Prior to Admit Meds:  (Not in a hospital admission)  Family HX:    Family History  Problem Relation Age of Onset  . Coronary artery disease Sister   . Heart disease Sister   . Allergies Sister   . Asthma Brother   . Rheum arthritis Brother    Social HX:    Social History   Socioeconomic History  . Marital status: Divorced    Spouse name: Not on file  . Number of children: 4  . Years of education: 37  . Highest education level: 12th grade  Occupational History  . Occupation: disabled  Social Needs  . Financial resource strain: Somewhat hard  . Food insecurity    Worry: Sometimes true    Inability: Sometimes true  . Transportation needs    Medical: No    Non-medical: Yes  Tobacco Use  . Smoking status: Former Smoker    Packs/day: 0.00    Years: 0.00    Pack years: 0.00    Types: Cigarettes    Quit date: 08/17/2003    Years since quitting: 15.7  . Smokeless tobacco: Never Used  . Tobacco comment: started at age 70.  only smoked on occ- very rarely.0  Substance and Sexual Activity  . Alcohol use: Yes    Comment: 06/23/2013 "drank some; never had a problem w/it"  . Drug use: No  . Sexual activity: Never  Lifestyle  . Physical activity    Days per week: Not on file    Minutes per session: Not on file  . Stress: Not on file  Relationships  . Social Herbalist on phone: Not on file    Gets together: Not on file    Attends religious service: Not on file    Active member of club or organization: Not on file    Attends  meetings of clubs or organizations: Not on file    Relationship status: Not on file  . Intimate partner violence    Fear of current or ex partner: Not on file    Emotionally abused: Not on file    Physically abused: Not on file    Forced sexual activity: Not on file  Other Topics Concern  . Not on file  Social History Narrative   12/10- reports issues with housing- states it is not disability friendly but has ramp, first floor apt, and has grab bars/assistive devices provided by [employer].  Reports that he has concerns with having enough food but gets meals on wheels, eats at [employer] 3x a week, and has personal aid who cooks and shops for him.  Reports concerns with transportation but gets [employer] transport for all of his medical appts and his family or an aid helps him with other transportation needs.     ROS:  All ROS were addressed and are negative except what is stated in the HPI  PHYSICAL EXAM Vitals:   04/28/19 1100 04/28/19 1226  BP: 124/64 131/68  Pulse: 62 61  Resp: (!) 27 (!) 23  Temp:    SpO2: 99% 97%   General: morbidly obese ill appearing in moderate respiratory distress sitting bolt upright to breath Head: Eyes PERRLA, No xanthomas.   Normal cephalic and atramatic  Lungs:   Decreased BS throughout Heart:   HRRR S1 S2 Pulses are 2+ & equal.            No carotid bruit. No JVD.  No abdominal bruits. No femoral bruits. Abdomen: Bowel sounds are positive, abdomen soft and non-tender without masses or                  Hernia's noted. Msk:  Back normal, normal gait. Normal strength and tone for age. Extremities:   No clubbing, cyanosis. DP +1.  2+ brawny edema Neuro: Alert and oriented X 3. Psych:  Good affect, responds appropriately   Labs:   Lab Results  Component Value Date   WBC 10.8 (H) 04/28/2019   HGB 11.4 (L) 04/28/2019   HCT 38.0 (L) 04/28/2019   MCV 83.9 04/28/2019   PLT 190 04/28/2019    Recent  Labs  Lab 04/28/19 1011  NA 139  K 5.0  CL 107  CO2 22  BUN  66*  CREATININE 2.78*  CALCIUM 8.8*  PROT 7.3  BILITOT 0.6  ALKPHOS 82  ALT 23  AST 14*  GLUCOSE 143*   Lab Results  Component Value Date   CKTOTAL 45 10/17/2013   CKMB <1.0 10/17/2013   TROPONINI <0.03 06/14/2017   No results found for: PTT Lab Results  Component Value Date   INR 1.28 06/19/2017   INR 1.20 06/15/2017   INR 1.31 10/17/2013     Lab Results  Component Value Date   CHOL 115 03/21/2018   CHOL 164 07/27/2017   CHOL 129 01/05/2017   Lab Results  Component Value Date   HDL 33 (L) 03/21/2018   HDL 38 (L) 07/27/2017   HDL 36 (L) 01/05/2017   Lab Results  Component Value Date   LDLCALC 66 03/21/2018   LDLCALC 90 07/27/2017   LDLCALC 71 01/05/2017   Lab Results  Component Value Date   TRIG 80 03/21/2018   TRIG 179 (H) 07/27/2017   TRIG 78 06/13/2017   Lab Results  Component Value Date   CHOLHDL 3.5 03/21/2018   CHOLHDL 4.3 07/27/2017   CHOLHDL 3.6 01/05/2017   No results found for: LDLDIRECT    Radiology:  Dg Chest Port 1 View  Result Date: 04/28/2019 CLINICAL DATA:  Shortness of breath since Wednesday. EXAM: PORTABLE CHEST 1 VIEW COMPARISON:  08/31/2018 FINDINGS: Right-sided pacemaker unchanged. Lungs are adequately inflated with minimal prominence of the perihilar vessels suggesting mild vascular congestion. Mild linear atelectasis lateral left base. Stable cardiomegaly. Remainder of the exam is unchanged. IMPRESSION: Cardiomegaly with suggestion of mild vascular congestion. Minimal linear atelectasis left base. Electronically Signed   By: Marin Olp M.D.   On: 04/28/2019 10:53     Telemetry    V paced rhythm - likely underlying a pacing as well- Personally Reviewed  ECG    V pacing - Personally Reviewed   ASSESSMENT:PLAN:   1.  Acute on chronic diastolic CHF -likely related to progressive severe AS -he states that his weight is up but is weight is actually down 6lbs from last OV in July. -he has evidence of volume overload on xray  as well as exam with LE edema.  He is in moderate respiratory distress having to sit bolt upright to breath and has not slept much in 3 days even with CPAP due to SOB.  -BNP mildly elevated but likely underestimated in setting of morbid obesity -worsening renal function with creatinine now 2.78 (was 1.97 8/31) - likely cardiorenal.  It was as high as 3.1 last April.  -Na normal -he is on Torsemide 100mg  BID at home -given lasix 80mg  IV in ER -Start Lasix 120mg  IV TID -follow daily weights, I&Os and renal function -will ask AHF service to see -will place on 2H stepdown -I will check a 2D echo to make sure that LVF has not declined since his last echo in June or that he has not developed right heart failure from worsening pulmonary HTN related to morbid obesity and OSA/obesity-hypoventilation syndrome.  -suspect that he has developed worsening AKI from recent cath and now volume overloaded -will check D-dimer since he is at risk for DVT and PE  2.  Severe AS -he has had cardiac cath showing nonobstructive CAD and severe AS -in the process of TAVR workup with right and left heart cath showing moderate to severe AS and  moderate pulmonary HTN -Has appt this week with Dr. Cyndia Bent -TAVR workup on hold for now pending improvement in renal function  3.  Hypertension -BP controlled at present -continue on amlodipine 10mg  daily, Hydralazine 50mg  TID, Toprol XL 50mg  daily  4.  Nonobstructive CAD -no anginal CP -trop mildly elevated at 30>32 -trop elevated secondary to CHF in setting of AKI on CKD  5.  AKI on CKD stage 4 -worsening renal function likely related to recent contrast exposure from his cath and worsening CHF -his creatinine has been has high was 3.1 in the past -hopefully renal function will improve with diuresis  6.  Paroxysmal atrial fibrillation -not a candidate for anticoagulation due to h/o rectal bleeding -Continue BB  7.  Type 2 DM -poorly controlled -check HbgA1C in am  -Continue Lantus Insulin qhs, Liraglutide  8.  HLD -LDL goal < 70 for DM and nonobstructive CAD -check FLP in am -continue Crestor 40mg  daily  9.  OSA with morbid obesity -he is on CPAP at night  The patient is critically ill with multiple organ systems failure and requires high complexity decision making for assessment and support, frequent evaluation and titration of therapies, application of advanced monitoring technologies and extensive interpretation of multiple databases. Critical Care Time devoted to patient care services described in this note independent of APP time is 60 minutes with >50% of time spent in direct patient care.     Fransico Him, MD  04/28/2019  1:53 PM

## 2019-04-28 NOTE — Progress Notes (Signed)
RT placed pt on CPAP dream station for the night on home setting of 4cm H2O w/6Lpm bled into the system. Pt tolerating well, respiratory status stable at this time on CPAP. RT will continue to monitor.

## 2019-04-29 ENCOUNTER — Other Ambulatory Visit (HOSPITAL_COMMUNITY): Payer: Medicare (Managed Care)

## 2019-04-29 LAB — GLUCOSE, CAPILLARY
Glucose-Capillary: 97 mg/dL (ref 70–99)
Glucose-Capillary: 67 mg/dL — ABNORMAL LOW (ref 70–99)
Glucose-Capillary: 136 mg/dL — ABNORMAL HIGH (ref 70–99)
Glucose-Capillary: 118 mg/dL — ABNORMAL HIGH (ref 70–99)
Glucose-Capillary: 153 mg/dL — ABNORMAL HIGH (ref 70–99)
Glucose-Capillary: 172 mg/dL — ABNORMAL HIGH (ref 70–99)

## 2019-04-29 LAB — BASIC METABOLIC PANEL
Anion gap: 12 (ref 5–15)
BUN: 64 mg/dL — ABNORMAL HIGH (ref 8–23)
CO2: 24 mmol/L (ref 22–32)
Calcium: 9 mg/dL (ref 8.9–10.3)
Chloride: 106 mmol/L (ref 98–111)
Creatinine, Ser: 2.65 mg/dL — ABNORMAL HIGH (ref 0.61–1.24)
GFR calc Af Amer: 27 mL/min — ABNORMAL LOW (ref >60)
GFR calc non Af Amer: 23 mL/min — ABNORMAL LOW (ref >60)
Glucose, Bld: 103 mg/dL — ABNORMAL HIGH (ref 70–99)
Potassium: 4.1 mmol/L (ref 3.5–5.1)
Sodium: 142 mmol/L (ref 135–145)

## 2019-04-29 NOTE — Progress Notes (Signed)
Orthopedic Tech Progress Note Patient Details:  [name] Jul 13, 1946 [id number]  Ortho Devices Type of Ortho Device: Haematologist Ortho Device/Splint Location: bilateral Ortho Device/Splint Interventions: Adjustment, Application, Ordered   Post Interventions Patient Tolerated: Well Instructions Provided: Care of device, Adjustment of device   Janit Pagan 04/29/2019, 5:18 PM

## 2019-04-29 NOTE — Consult Note (Signed)
Advanced Heart Failure Team Consult Note   Primary Physician: Ryan Adie, Fletcher PCP-Cardiologist:  [place] HF: [name]   Reason for Consultation: Recurrent HF  HPI:    [name] is a 73 y.o. male who is being seen today for the evaluation of SOB and CHF at the request of [name].   [name] a 73 year old gentleman with chronic kidney disease baseline creatinine ~2.5), morbid obesity, RV dysfunction, and blindness. He has undergone permanent pacemaker placement in the past. In Jan 2020 he was hospitalized with acute on chronic diastolic CHF in the setting of severe AS.  His hospitalization was complicated by AKI and was felt at that time not to be a good candidate for TAVR due to morbid obesity, poor functional capacity, chronic deconditioning, RV dysfunction and blindness.    He was seen back by Dr. Burt Fletcher 03/09/2019 in the office and had ongoing SOB.  He was set up for right and left heart catheterization as part of his evaluation on 04/10/2019 showing diffuse calcific non obstuctive CAD and moderately severe AS with moderate PHTN with mean PAP 38mmHg.  He was set up to see [name] in consult for TAVR on Wed of this week but presented to the ER on 9/12 with complaints of increasing SOB over the past 3 days with 3 pillow orthopnea, having to sit bolt upright even with CPAP and PND.  He has chronic LE edema which appears worse. He reported weight gain but weight actually down 6 pounds 317 -> 311 from previous.  RHC 8/20  RA 18 PA 71/28 (41) LVEDP 25 Fick 7.5/2.91 PVR 2.1 AVG mean 44mmHG  In ER Cxray showed mild vascular congestion and worsening renal function with creatinine 2.78 (1.97 on 04/16/2019).  Trop mildly elevated at 30 and 32 and Hbg 11.4.  BNP 140.   Echo 04/28/19 EF 60-65% Mod AS (mean gradient 30) Mild RV dysfunction Personally reviewed  Was started on lasix 120 IV bid. 1.4 L out overnight. Weight reported as up 5 pounds. Creatinine 2.78 -> 2.65   Says he feels better. But breathing isn't back to baseline yet.    Review of Systems: [y] = yes, [ ]  = no   . General: Weight gain Blue.Reese ]; Weight loss [ ] ; Anorexia [ ] ; Fatigue Blue.Reese ]; Fever [ ] ; Chills [ ] ; Weakness Blue.Reese ]  . Cardiac: Chest pain/pressure Blue.Reese ]; Resting SOB Blue.Reese ]; Exertional SOB Blue.Reese ]; Orthopnea Blue.Reese ]; Pedal Edema Blue.Reese ]; Palpitations [ ] ; Syncope [ ] ; Presyncope [ ] ; Paroxysmal nocturnal dyspnea[ ]   . Pulmonary: Cough [ ] ; Wheezing[ ] ; Hemoptysis[ ] ; Sputum [ ] ; Snoring y[ ]   . GI: Vomiting[ ] ; Dysphagia[ ] ; Melena[ ] ; Hematochezia [ ] ; Heartburn[ ] ; Abdominal pain [ ] ; Constipation [ ] ; Diarrhea [ ] ; BRBPR [ ]   . GU: Hematuria[ ] ; Dysuria [ ] ; Nocturia[ ]   . Vascular: Pain in legs with walking [ ] ; Pain in feet with lying flat [ ] ; Non-healing sores [ ] ; Stroke [ ] ; TIA [ ] ; Slurred speech [ ] ;  . Neuro: Headaches[ ] ; Vertigo[ ] ; Seizures[ ] ; Paresthesias[ ] ;Blurred vision [ ] ; Diplopia [ ] ; Vision changes [ ]   . Ortho/Skin: Arthritis Blue.Reese ]; Joint pain Blue.Reese ]; Muscle pain [ y]; Joint swelling [ ] ; Back Pain Blue.Reese ]; Rash [ ]   . Psych: Depression[ ] ; Anxiety[ ]   . Heme: Bleeding problems [ ] ; Clotting disorders [ ] ; Anemia [ ]   . Endocrine: Diabetes [ y]; Thyroid dysfunction[ ]   Home Medications Prior to Admission medications   Medication Sig Start Date End Date Taking? Authorizing Provider  acetaminophen (TYLENOL) 650 MG CR tablet Take 1,300 mg by mouth 2 (two) times daily.    Yes [name]  albuterol (PROVENTIL HFA;VENTOLIN HFA) 108 (90 Base) MCG/ACT inhaler Inhale 2 puffs into the lungs every 6 (six) hours as needed for wheezing or shortness of breath.   Yes [name]  amLODipine (NORVASC) 10 MG tablet Take 10 mg by mouth daily. Reported on 12/09/2015   Yes [name]  antiseptic oral rinse (BIOTENE) LIQD 15 mLs by Mouth Rinse route 3 (three) times daily as needed for dry mouth.    Yes [name]  aspirin EC 81 MG tablet Take 1  tablet (81 mg total) by mouth daily. 01/19/19  Yes Clegg, [name]  cholecalciferol (VITAMIN Fletcher) 1000 units tablet Take 1,000 Units by mouth daily.   Yes [name]  citalopram (CELEXA) 20 MG tablet Take 20 mg by mouth daily.   Yes [name]  famotidine (PEPCID) 20 MG tablet Take 20 mg by mouth daily.   Yes [name]  Febuxostat (ULORIC) 80 MG TABS Take 80 mg by mouth daily.    Yes [name]  gabapentin (NEURONTIN) 300 MG capsule Take 300 mg by mouth 2 (two) times daily.   Yes [name]  hydrALAZINE (APRESOLINE) 50 MG tablet Take 1 tablet (50 mg total) 3 (three) times daily by mouth. 06/24/17  Yes Fletcher, [name]  insulin glargine (LANTUS) 100 UNIT/ML injection Inject 0.25 mLs (25 Units total) into the skin at bedtime. Patient taking differently: Inject 90 Units into the skin at bedtime.  09/08/18  Yes Ryan Aloe, Fletcher  Liraglutide (VICTOZA) 18 MG/3ML SOPN Inject 1.8 mg into the skin daily.   Yes [name]  Menthol, Topical Analgesic, (BIOFREEZE EX) Apply 1 application topically 2 (two) times daily as needed (knee/shoulder pain).    Yes [name]  metolazone (ZAROXOLYN) 2.5 MG tablet Take 1 tablet (2.5 mg total) by mouth every other day. Patient taking differently: Take 2.5 mg by mouth daily.  09/10/18  Yes Ryan Aloe, Fletcher  metoprolol succinate (TOPROL-XL) 50 MG 24 hr tablet Take 1 tablet (50 mg total) by mouth daily. Take with or immediately following a meal. 03/21/18  Yes Ryan Dresser, Fletcher  polyethylene glycol [facility] / Floria Raveling) packet Take 17 g by mouth daily as needed for moderate constipation.   Yes [name]  potassium chloride SA (K-DUR) 20 MEQ tablet Take 5 tablets (100 mEq total) by mouth daily. Patient taking differently: Take 100 mEq by mouth 2 (two) times daily.  01/19/19  Yes Clegg, [name]  rosuvastatin (CRESTOR) 20 MG tablet Take 20 mg by mouth daily.   Yes  [name]  sennosides-docusate sodium (SENOKOT-S) 8.6-50 MG tablet Take 2 tablets by mouth 2 (two) times daily.    Yes [name]  Skin Protectants, Misc. (EUCERIN) cream Apply 1 application topically 2 (two) times daily.   Yes [name]  torsemide (DEMADEX) 100 MG tablet Take 100 mg by mouth 2 (two) times daily.   Yes [name]  torsemide (DEMADEX) 20 MG tablet Take 6 tablets (120 mg total) by mouth 2 (two) times daily. Patient taking differently: Take 20 mg by mouth 2 (two) times daily.  09/08/18  Yes Ryan Aloe, Fletcher    Past Medical History:  Past Medical History:  Diagnosis Date  . Arthritis    "all over" (07/15/13)  . Atrial fibrillation ([place])    not Candidate for Coumadin Fletcher/t rectal bleeding  . Benign prostatic hypertrophy   . Cardiomyopathy, hypertensive ([place])     diastolic dysfunction by 1027 echo  . Coronary artery disease     nonobstructive. 2 vessel. cardiac catheter in 2003-11-28 showing 60% LAD occlusion, 30% circumflex occlusion.  . Degenerative joint disease     Right knee.  . Diabetes type 2, uncontrolled ([place])   . Diabetic peripheral neuropathy ([place])   . Diabetic ulcer of thigh ([place])    H/O Rt thigh ulcer.  . Exertional shortness of breath    "comes and goes" (2013-07-15)  . GERD (gastroesophageal reflux disease)   . Gout   . Herpes   . Hyperlipidemia   . Hypertension   . Hypotension 09/05/2013  . Incarcerated ventral hernia    H/O. S/P repair ventral hernia repair 07/2005.  Marland Kitchen LBBB (left bundle branch block)   . Legally blind   . Morbid obesity ([place])   . Obstructive sleep apnea    On CPAP. Last sleep study (06/2009) - Severe obstructive sleep apnea/hypopnea syndrome, apnea-hypopnea index 70.5 per hour with non positional events moderately loud snoring and oxygen desaturation to a nadir of 85% on room air.  . Occult blood positive stool     History of in August 2007.  colonoscopy in January 2008  showing normal colon with fair inadequate prep. By Dr. Silvano Rusk.  . Pacemaker 11/28/03    secondary to bradycardia  . Sepsis ([place]) 08/26/2013    Past Surgical History: Past Surgical History:  Procedure Laterality Date  . AMPUTATION Left 06/22/2013   Procedure: AMPUTATION FOOT;  Surgeon: Newt Minion, Fletcher;  Location: [place];  Service: Orthopedics;  Laterality: Left;  Left Midfoot Amputation  . CARDIAC CATHETERIZATION  10/22/2003   Dilatation of the LV with preserved LV systolic function. Tortous aorta, but no AAA. Moderate disease with 50-60% area of narrowing in the circumflex after the second OM and 70% mid narrowing in the LAD. No intervention.  Marland Kitchen CARDIOVASCULAR STRESS TEST  01/23/2010   Perfusion defect seen in inferior myocardial region is consistent with diaphragmatic attenuation. Remaining myocardium demonstrates normal myocardial perfusion with no evidence of ischemia or infarct. Stress ECG was non-diagnostic due to LBBB.  Marland Kitchen CATARACT EXTRACTION, BILATERAL Bilateral   . ESOPHAGOGASTRODUODENOSCOPY N/A 10/12/2013   Procedure: ESOPHAGOGASTRODUODENOSCOPY (EGD);  Surgeon: Gwenyth Ober, Fletcher;  Location: [place];  Service: General;  Laterality: N/A;  bedside  . EYE SURGERY    . 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 HERNIA REPAIR    . INSERT / REPLACE / REMOVE PACEMAKER  11/28/03   Secondary to symptomatic bradycardia. DDDR pacemaker. By Dr. Rollene Fare.  Randolm Idol / REPLACE / REMOVE PACEMAKER  11-27-12   "old pacemaker died; had new one put in" (2013-07-15)  . Pacemaker lead revision  12/2003    likely microdislodgment  of right ventricular lead  . PEG PLACEMENT N/A 10/12/2013   Procedure: PERCUTANEOUS ENDOSCOPIC GASTROSTOMY (PEG) PLACEMENT;  Surgeon: Gwenyth Ober, Fletcher;  Location: [place];  Service: General;  Laterality: N/A;  . PERMANENT PACEMAKER GENERATOR CHANGE N/A 11/03/2012   Procedure: PERMANENT PACEMAKER GENERATOR CHANGE;  Surgeon: Sanda Klein, Fletcher;  Location: [place] CATH LAB;  Service: Cardiovascular;  Laterality: N/A;  . REFRACTIVE SURGERY Bilateral   . RIGHT HEART CATH N/A 09/05/2018  Procedure: RIGHT HEART CATH;  Surgeon: Ryan Dresser, Fletcher;  Location: [place] CV LAB;  Service: Cardiovascular;  Laterality: N/A;  . RIGHT/LEFT HEART CATH AND CORONARY ANGIOGRAPHY N/A 04/10/2019   Procedure: RIGHT/LEFT HEART CATH AND CORONARY ANGIOGRAPHY;  Surgeon: [name], Fletcher;  Location: [place] CV LAB;  Service: Cardiovascular;  Laterality: N/A;  . TEE WITHOUT CARDIOVERSION N/A 09/05/2018   Procedure: TRANSESOPHAGEAL ECHOCARDIOGRAM (TEE);  Surgeon: Ryan Dresser, Fletcher;  Location: [facility] ENDOSCOPY;  Service: Cardiovascular;  Laterality: N/A;  . TRANSTHORACIC ECHOCARDIOGRAM  01/01/2013   EF 50-55%, ventricular septum-thickness moderately increased  . VENOUS DOPPLER  01/17/2013   No evidence of thrombus or thrombophlebitis. Left GSVs appear enlarged and demonstrate valvular insuffiiciency. Bilateral SSVs appeared patent with no valvular insufficiency seen.  Marland Kitchen VENTRAL HERNIA REPAIR  07/2005    S/P laparotomy, lysis of adhesions, repair of incarcerated ventral hernia with  partial omentectomy.. Performed by Dr. Marlou Starks.    Family History: Family History  Problem Relation Age of Onset  . Coronary artery disease Sister   . Heart disease Sister   . Allergies Sister   . Asthma Brother   . Rheum arthritis Brother     Social History: Social History   Socioeconomic History  . Marital status: Divorced    Spouse name: Not on file  . Number of children: 4  . Years of education: 7  . Highest education level: 12th grade  Occupational History  . Occupation: disabled  Social Needs  . Financial resource strain: Somewhat hard  . Food insecurity    Worry: Sometimes true    Inability: Sometimes true  . Transportation needs    Medical: No    Non-medical: Yes  Tobacco Use  . Smoking status: Former Smoker    Packs/day: 0.00    Years:  0.00    Pack years: 0.00    Types: Cigarettes    Quit date: 08/17/2003    Years since quitting: 15.7  . Smokeless tobacco: Never Used  . Tobacco comment: started at age 42.  only smoked on occ- very rarely.0  Substance and Sexual Activity  . Alcohol use: Yes    Comment: 06/23/2013 "drank some; never had a problem w/it"  . Drug use: No  . Sexual activity: Never  Lifestyle  . Physical activity    Days per week: Not on file    Minutes per session: Not on file  . Stress: Not on file  Relationships  . Social Herbalist on phone: Not on file    Gets together: Not on file    Attends religious service: Not on file    Active member of club or organization: Not on file    Attends meetings of clubs or organizations: Not on file    Relationship status: Not on file  Other Topics Concern  . Not on file  Social History Narrative   12/10- reports issues with housing- states it is not disability friendly but has ramp, first floor apt, and has grab bars/assistive devices provided by [employer].  Reports that he has concerns with having enough food but gets meals on wheels, eats at [facility] 3x a week, and has personal aid who cooks and shops for him.  Reports concerns with transportation but gets [facility] transport for all of his medical appts and his family or an aid helps him with other transportation needs.    Allergies:  No Known Allergies  Objective:    Vital Signs:   Temp:  [  97.3 F (36.3 C)-98.3 F (36.8 C)] 98.2 F (36.8 C) (09/13 1128) Pulse Rate:  [59-83] 66 (09/13 1300) Resp:  [13-32] 13 (09/13 1300) BP: (135-183)/(66-101) 137/66 (09/13 1300) SpO2:  [88 %-98 %] 95 % (09/13 1300) Weight:  [143.6 kg] 143.6 kg (09/13 0600) Last BM Date: 04/28/19  Weight change: Filed Weights   04/28/19 1005 04/29/19 0600  Weight: (!) 141.1 kg (!) 143.6 kg    Intake/Output:   Intake/Output Summary (Last 24 hours) at 04/29/2019 1308 Last data filed at 04/29/2019 1200 Gross per 24 hour  Intake  1267.19 ml  Output 1400 ml  Net -132.81 ml      Physical Exam    General:  Obese male. Sitting on bedside commode  No resp difficulty HEENT: normal x for blindness Neck: supple. JVP hard to see . Carotids 2+ bilat; + bruits. No lymphadenopathy or thyromegaly appreciated. Cor: PMI nondisplaced. Regular rate & rhythm. 2/6 AS s2 only mildly to moderately decreased Lungs: clear Abdomen:  Obese soft, nontender, nondistended. No hepatosplenomegaly. No bruits or masses. Good bowel sounds. Extremities: no cyanosis, clubbing, rash, 2+ shiny edema with chronic venous stasis changes S/p left transmet amp  Neuro: alert & orientedx3, cranial nerves grossly intact x for blindness . moves all 4 extremities w/o difficulty. Affect pleasant   Telemetry   V pacing 70s Personally reviewed   EKG    V pacing 67 bpm - underlying rhythm unclear. Personally reviewed   Labs   Basic Metabolic Panel: Recent Labs  Lab 04/28/19 1011 04/28/19 1452 04/29/19 0222  NA 139 142 142  K 5.0 4.9 4.1  CL 107  --  106  CO2 22  --  24  GLUCOSE 143*  --  103*  BUN 66*  --  64*  CREATININE 2.78*  --  2.65*  CALCIUM 8.8*  --  9.0    Liver Function Tests: Recent Labs  Lab 04/28/19 1011  AST 14*  ALT 23  ALKPHOS 82  BILITOT 0.6  PROT 7.3  ALBUMIN 3.1*   No results for input(s): LIPASE, AMYLASE in the last 168 hours. No results for input(s): AMMONIA in the last 168 hours.  CBC: Recent Labs  Lab 04/28/19 1011 04/28/19 1452  WBC 10.8*  --   NEUTROABS 8.2*  --   HGB 11.4* 13.3  HCT 38.0* 39.0  MCV 83.9  --   PLT 190  --     Cardiac Enzymes: No results for input(s): CKTOTAL, CKMB, CKMBINDEX, TROPONINI in the last 168 hours.  BNP: BNP (last 3 results) Recent Labs    08/31/18 1406 04/28/19 1011  BNP 151.0* 140.8*    ProBNP (last 3 results) No results for input(s): PROBNP in the last 8760 hours.   CBG: Recent Labs  Lab 04/28/19 2141 04/29/19 0347 04/29/19 0658 04/29/19 0836  04/29/19 1130  GLUCAP 129* 97 67* 136* 118*    Coagulation Studies: No results for input(s): LABPROT, INR in the last 72 hours.   Imaging    No results found.   Medications:     Current Medications: . amLODipine  10 mg Oral Daily  . aspirin EC  81 mg Oral Daily  . Chlorhexidine Gluconate Cloth  6 each Topical Daily  . cholecalciferol  1,000 Units Oral Daily  . citalopram  20 mg Oral Daily  . famotidine  20 mg Oral Daily  . febuxostat  80 mg Oral Daily  . gabapentin  300 mg Oral BID  . heparin  5,000 Units Subcutaneous Q8H  .  hydrALAZINE  50 mg Oral TID  . insulin aspart  0-20 Units Subcutaneous TID WC  . insulin glargine  90 Units Subcutaneous QHS  . liraglutide  1.8 mg Subcutaneous Daily  . mouth rinse  15 mL Mouth Rinse BID  . metoprolol succinate  50 mg Oral Daily  . rosuvastatin  20 mg Oral Daily  . senna-docusate  2 tablet Oral BID  . sodium chloride flush  3 mL Intravenous Q12H     Infusions: . sodium chloride    . furosemide 120 mg (04/29/19 1059)     Assessment/Plan   1.  Acute on chronic diastolic biventricular CHF - in setting of moderate AS - echo from 9/12 unchanged EF 60-65% moderate AS (mean 30) RV mild HK  - seems volume overloaded on exam but hard to see JVP given size. - volume management limited by severe cardiorenal syndrome. He has been on torsemide 120 bid and metolazone 2.5 MWF Suspect he may be nearing need for HD - Will continue IV lasix. Place UNNA boot   2.  Moderate to severe AS -in the process of TAVR workup with right and left heart cath showing moderately severe AS and moderate pulmonary HTN (Has appt this week with [name]) -TAVR workup on hold for now due to AKI/CKD -I don't think his AS is the major factorin his HF currently  3.  AKI on CKD stage 4 -worsening renal function likely related to recent contrast exposure from his cath and worsening CHF -his creatinine has been has high was 3.1 in the past. Baseline seems to  be ~2.5 -hopefully renal function will continue to improve with diuresis but I suspect he may be progressing to HD  4.  Hypertension -BP controlled at present -continue on amlodipine 10mg  daily, Hydralazine 50mg  TID, Toprol XL 50mg  daily  5.  Nonobstructive CAD -cath 8/20 with diffuse nonobstructive CAD -no angina currently -trop minimally elevated 30>32 -trop elevated secondary to CHF in setting of AKI on CKD  6.  Paroxysmal atrial fibrillation -not a candidate for anticoagulation due to h/o rectal bleeding -Continue BB  7.  Type 2 DM -poorly controlled -HbgA1C pending -Continue Lantus Insulin qhs, Liraglutide  8.  OSA with morbid obesity -he is on CPAP at night  9. H/o heart block/symptomatic brady - s/p MDT dual chamber pacing   Can go to SDU  Length of Stay: 1  Glori Bickers, Fletcher  04/29/2019, 1:08 PM  Advanced Heart Failure Team Pager [phone] (M-F; 7a - 4p)  Please contact [facility] for night-coverage after hours (4p -7a ) and weekends on [email or web address]

## 2019-04-29 NOTE — Progress Notes (Signed)
Patient arrived to floor from [facility]. Order for [facility]. Patient does not have them on. I paged Ortho to come and place per order

## 2019-04-29 NOTE — Plan of Care (Signed)

## 2019-04-30 DIAGNOSIS — N179 Acute kidney failure, unspecified: Secondary | ICD-10-CM

## 2019-04-30 DIAGNOSIS — I5031 Acute diastolic (congestive) heart failure: Secondary | ICD-10-CM

## 2019-04-30 LAB — CBC
HCT: 37.3 % — ABNORMAL LOW (ref 39.0–52.0)
Hemoglobin: 11.1 g/dL — ABNORMAL LOW (ref 13.0–17.0)
MCH: 24.8 pg — ABNORMAL LOW (ref 26.0–34.0)
MCHC: 29.8 g/dL — ABNORMAL LOW (ref 30.0–36.0)
MCV: 83.4 fL (ref 80.0–100.0)
Platelets: 201 10*3/uL (ref 150–400)
RBC: 4.47 MIL/uL (ref 4.22–5.81)
RDW: 15.7 % — ABNORMAL HIGH (ref 11.5–15.5)
WBC: 11.1 10*3/uL — ABNORMAL HIGH (ref 4.0–10.5)
nRBC: 0 % (ref 0.0–0.2)

## 2019-04-30 LAB — BASIC METABOLIC PANEL
Anion gap: 10 (ref 5–15)
BUN: 60 mg/dL — ABNORMAL HIGH (ref 8–23)
CO2: 28 mmol/L (ref 22–32)
Calcium: 8.8 mg/dL — ABNORMAL LOW (ref 8.9–10.3)
Chloride: 104 mmol/L (ref 98–111)
Creatinine, Ser: 2.53 mg/dL — ABNORMAL HIGH (ref 0.61–1.24)
GFR calc Af Amer: 28 mL/min — ABNORMAL LOW (ref >60)
GFR calc non Af Amer: 25 mL/min — ABNORMAL LOW (ref >60)
Glucose, Bld: 67 mg/dL — ABNORMAL LOW (ref 70–99)
Potassium: 3.4 mmol/L — ABNORMAL LOW (ref 3.5–5.1)
Sodium: 142 mmol/L (ref 135–145)

## 2019-04-30 LAB — GLUCOSE, CAPILLARY
Glucose-Capillary: 60 mg/dL — ABNORMAL LOW (ref 70–99)
Glucose-Capillary: 83 mg/dL (ref 70–99)
Glucose-Capillary: 222 mg/dL — ABNORMAL HIGH (ref 70–99)
Glucose-Capillary: 126 mg/dL — ABNORMAL HIGH (ref 70–99)
Glucose-Capillary: 138 mg/dL — ABNORMAL HIGH (ref 70–99)

## 2019-04-30 LAB — HEMOGLOBIN A1C
Hgb A1c MFr Bld: 7.9 % — ABNORMAL HIGH (ref 4.8–5.6)
Mean Plasma Glucose: 180 mg/dL

## 2019-04-30 MED ORDER — METOLAZONE 5 MG PO TABS
5.0000 mg | ORAL_TABLET | Freq: Once | ORAL | Status: AC
  Administered 2019-04-30: 12:00:00 5 mg via ORAL
  Filled 2019-04-30: qty 1

## 2019-04-30 MED ORDER — INSULIN GLARGINE 100 UNIT/ML ~~LOC~~ SOLN
80.0000 [IU] | Freq: Every day | SUBCUTANEOUS | Status: DC
  Administered 2019-04-30: 80 [IU] via SUBCUTANEOUS
  Filled 2019-04-30 (×2): qty 0.8

## 2019-04-30 MED ORDER — POTASSIUM CHLORIDE CRYS ER 20 MEQ PO TBCR
40.0000 meq | EXTENDED_RELEASE_TABLET | Freq: Once | ORAL | Status: AC
  Administered 2019-04-30: 40 meq via ORAL
  Filled 2019-04-30: qty 2

## 2019-04-30 NOTE — Plan of Care (Signed)
  Problem: Education: Goal: Knowledge of General Education information will improve Description: Including pain rating scale, medication(s)/side effects and non-pharmacologic comfort measures Outcome: Progressing   Problem: Health Behavior/Discharge Planning: Goal: Ability to manage health-related needs will improve Outcome: Progressing   Problem: Clinical Measurements: Goal: Will remain free from infection Outcome: Progressing   Problem: Nutrition: Goal: Adequate nutrition will be maintained Outcome: Progressing   Problem: Coping: Goal: Level of anxiety will decrease Outcome: Progressing   Problem: Elimination: Goal: Will not experience complications related to bowel motility Outcome: Progressing

## 2019-04-30 NOTE — Progress Notes (Signed)
Inpatient Diabetes Program Recommendations  AACE/ADA: New Consensus Statement on Inpatient Glycemic Control (2015)  Target Ranges:  Prepandial:   less than 140 mg/dL      Peak postprandial:   less than 180 mg/dL (1-2 hours)      Critically ill patients:  140 - 180 mg/dL   Lab Results  Component Value Date   GLUCAP 222 (H) 04/30/2019   HGBA1C 7.9 (H) 04/29/2019    Review of Glycemic Control Results for [name], [name] (MRN [id number]) as of 04/30/2019 12:49  Ref. Range 04/29/2019 06:58 04/29/2019 08:36 04/29/2019 11:30 04/29/2019 15:24 04/29/2019 21:16 04/30/2019 07:38 04/30/2019 08:32 04/30/2019 11:38  Glucose-Capillary Latest Ref Range: 70 - 99 mg/dL 67 (L) 136 (H) 118 (H) 153 (H) 172 (H) 60 (L) 83 222 (H)   Diabetes history: DM2 Outpatient Diabetes medications: Lantus 90 units qd + Victoza 1.8 mg qd Current orders for Inpatient glycemic control: Lantus 90 units + Novolog resistant correction tid  Inpatient Diabetes Program Recommendations:   Fasting CBG 81 this am and yesterday 67. -Decrease Lantus to 80 units -Decrease Novolog correction scale to sensitive tid  Thank you, Nani Gasser. Nitika Jackowski, RN, MSN, CDE  Diabetes Coordinator Inpatient Glycemic Control Team Team Pager [phone] (8am-5pm) 04/30/2019 12:52 PM

## 2019-04-30 NOTE — Progress Notes (Addendum)
Hypoglycemic Event  CBG: 60  Treatment: 4oz Apple Juice Symptoms: Asymptomatic  Follow-up CBG: Time:0800 CBG Result:83  Possible Reasons for Event: Pt had not eaten breakfast Comments: Pt asymptomatic, sitting up in bed with no complaints voiced or distress noted.     Leonidas Romberg

## 2019-04-30 NOTE — Progress Notes (Signed)
Patient ID: [name], male   DOB: 19-Jul-1946, 73 y.o.   MRN: [id number]     Advanced Heart Failure Rounding Note  PCP-Cardiologist: No primary care provider on file.   Subjective:    Breathing not back to baseline.  He did not diurese much yesterday, I/Os even.  Weight is not down.  Creatinine is lower at 2.53.    Objective:   Weight Range: (!) 143.8 kg Body mass index is 42.99 kg/m.   Vital Signs:   Temp:  [97.4 F (36.3 C)-98.2 F (36.8 C)] 98.1 F (36.7 C) (09/14 0407) Pulse Rate:  [63-81] 68 (09/14 0407) Resp:  [13-31] 19 (09/14 0407) BP: (129-171)/(66-87) 154/82 (09/14 0407) SpO2:  [89 %-95 %] 94 % (09/14 0407) Weight:  [143.8 kg] 143.8 kg (09/14 0407) Last BM Date: 04/29/19  Weight change: Filed Weights   04/28/19 1005 04/29/19 0600 04/30/19 0407  Weight: (!) 141.1 kg (!) 143.6 kg (!) 143.8 kg    Intake/Output:   Intake/Output Summary (Last 24 hours) at 04/30/2019 0941 Last data filed at 04/30/2019 0929 Gross per 24 hour  Intake 1387.19 ml  Output 2225 ml  Net -837.81 ml      Physical Exam    General:  Well appearing. No resp difficulty HEENT: Normal Neck: Thick. JVP difficult but appears elevated. Carotids 2+ bilat; no bruits. No lymphadenopathy or thyromegaly appreciated. Cor: PMI nonpalpable. Regular rate & rhythm. 2/6 SEM RUSB, S2 soft but present.  Lungs: Distant breath sounds.  Abdomen: Soft, nontender, nondistended. No hepatosplenomegaly. No bruits or masses. Good bowel sounds. Extremities: No cyanosis, clubbing, rash.  Unna boots in place, 1+ edema to knees.  Neuro: Alert & orientedx3, cranial nerves grossly intact. moves all 4 extremities w/o difficulty. Affect pleasant   Telemetry    NSR with long PR interval alternating with RV pacing. Personally reviewed.   Labs    CBC Recent Labs    04/28/19 1011 04/28/19 1452 04/30/19 0713  WBC 10.8*  --  11.1*  NEUTROABS 8.2*  --   --   HGB 11.4* 13.3 11.1*  HCT 38.0* 39.0 37.3*  MCV  83.9  --  83.4  PLT 190  --  622   Basic Metabolic Panel Recent Labs    04/29/19 0222 04/30/19 0713  NA 142 142  K 4.1 3.4*  CL 106 104  CO2 24 28  GLUCOSE 103* 67*  BUN 64* 60*  CREATININE 2.65* 2.53*  CALCIUM 9.0 8.8*   Liver Function Tests Recent Labs    04/28/19 1011  AST 14*  ALT 23  ALKPHOS 82  BILITOT 0.6  PROT 7.3  ALBUMIN 3.1*   No results for input(s): LIPASE, AMYLASE in the last 72 hours. Cardiac Enzymes No results for input(s): CKTOTAL, CKMB, CKMBINDEX, TROPONINI in the last 72 hours.  BNP: BNP (last 3 results) Recent Labs    08/31/18 1406 04/28/19 1011  BNP 151.0* 140.8*    ProBNP (last 3 results) No results for input(s): PROBNP in the last 8760 hours.   D-Dimer Recent Labs    04/28/19 1819  DDIMER 0.99*   Hemoglobin A1C No results for input(s): HGBA1C in the last 72 hours. Fasting Lipid Panel No results for input(s): CHOL, HDL, LDLCALC, TRIG, CHOLHDL, LDLDIRECT in the last 72 hours. Thyroid Function Tests Recent Labs    04/28/19 1819  TSH 2.939    Other results:   Imaging     No results found.   Medications:     Scheduled Medications: . amLODipine  10 mg Oral Daily  . aspirin EC  81 mg Oral Daily  . Chlorhexidine Gluconate Cloth  6 each Topical Daily  . cholecalciferol  1,000 Units Oral Daily  . citalopram  20 mg Oral Daily  . famotidine  20 mg Oral Daily  . febuxostat  80 mg Oral Daily  . gabapentin  300 mg Oral BID  . heparin  5,000 Units Subcutaneous Q8H  . hydrALAZINE  50 mg Oral TID  . insulin aspart  0-20 Units Subcutaneous TID WC  . insulin glargine  90 Units Subcutaneous QHS  . mouth rinse  15 mL Mouth Rinse BID  . metolazone  5 mg Oral Once  . metoprolol succinate  50 mg Oral Daily  . potassium chloride  40 mEq Oral Once  . rosuvastatin  20 mg Oral Daily  . senna-docusate  2 tablet Oral BID  . sodium chloride flush  3 mL Intravenous Q12H     Infusions: . sodium chloride    . furosemide 120 mg  (04/30/19 0415)     PRN Medications:  sodium chloride, acetaminophen, albuterol, antiseptic oral rinse, ondansetron (ZOFRAN) IV, polyethylene glycol, sodium chloride flush   Assessment/Plan   1. Acute on chronic diastolic CHF with prominent RV failure: Volume difficult to manage in setting of RV failure (OHS/OSA), CKD, and severe AS.  Echo this admission with EF 60-65%, RV reportedly normal, mod-severe aortic stenosis with mean gradient 30 mmHg.  He was admitted with increased dyspnea (at rest).  Creatinine noted to be worse, suspect contrast nephropathy from recent cath as well as cardiorenal.  He has not diuresed well here despite high dose IV Lasix, has been on aggressive home diuretic regimen.  Volume status difficult given body habitus but suspect significantly volume overloaded still.  - Continue Lasix 120 mg IV every 8 hrs, I will give him a dose of metolazone 5 mg x 1 with next IV Lasix.  - I am concerned that he is eventually going to require HD for volume management.  2. Aortic stenosis:  Appeared severe by 1/20 TEE, has looked moderate-severe (mean gradient 30s, AVA around 1.0 cm^2 on subsequent TTEs and RHC/LHC.  He is a very difficult TAVR candidate with OHS/OSA, significant diastolic failure, poor mobility, and CKD.  He has been seen by Dr. Burt Knack and has had cath, however now with AKI.  I agree that main problem now seems to be renal dysfunction with diastolic failure, not sure how much role that aortic stenosis is playing currently.   - Continue discussions with structural heart team.  3. Atrial fibrillation:  He appears to be in sinus with very low voltage Ps (regular rhythm).  He is not anticoagulated (takes ASA) due to prior intracerebral hemorrhage. 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   - As he is not anticoagulated, would continue rate control.  Cannot do DCCV without anticoagulation.  - Continue Toprol XL 50 mg daily.  4. CAD:  Nonobstructive on 8/20 cath.  No chest pain.  - Continue ASA and statin.   5. 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  6. AKI on CKD stage III: creatinine 1.97 prior, up to 2.78 at admission.  May have had contrast nephropathy from recent cath, also suspect cardiorenal syndrome.  Creatinine trending down, 2.53 today.  - He may end up needing HD for volume management, at baseline he is on very high diuretic doses.  7. OHS/OSA: Continue nightly CPAP and  home oxygen.   Length of Stay: 2  Loralie Champagne, MD  04/30/2019, 9:41 AM  Advanced Heart Failure Team Pager [phone] (M-F; 7a - 4p)  Please contact [facility] for night-coverage after hours (4p -7a ) and weekends on [email or web address]

## 2019-05-01 DIAGNOSIS — N179 Acute kidney failure, unspecified: Secondary | ICD-10-CM

## 2019-05-01 DIAGNOSIS — I350 Nonrheumatic aortic (valve) stenosis: Secondary | ICD-10-CM

## 2019-05-01 LAB — BASIC METABOLIC PANEL
Anion gap: 11 (ref 5–15)
BUN: 57 mg/dL — ABNORMAL HIGH (ref 8–23)
CO2: 30 mmol/L (ref 22–32)
Calcium: 8.7 mg/dL — ABNORMAL LOW (ref 8.9–10.3)
Chloride: 98 mmol/L (ref 98–111)
Creatinine, Ser: 2.39 mg/dL — ABNORMAL HIGH (ref 0.61–1.24)
GFR calc Af Amer: 30 mL/min — ABNORMAL LOW (ref >60)
GFR calc non Af Amer: 26 mL/min — ABNORMAL LOW (ref >60)
Glucose, Bld: 67 mg/dL — ABNORMAL LOW (ref 70–99)
Potassium: 3.1 mmol/L — ABNORMAL LOW (ref 3.5–5.1)
Sodium: 139 mmol/L (ref 135–145)

## 2019-05-01 LAB — CBC
HCT: 34.5 % — ABNORMAL LOW (ref 39.0–52.0)
Hemoglobin: 10.3 g/dL — ABNORMAL LOW (ref 13.0–17.0)
MCH: 24.8 pg — ABNORMAL LOW (ref 26.0–34.0)
MCHC: 29.9 g/dL — ABNORMAL LOW (ref 30.0–36.0)
MCV: 83.1 fL (ref 80.0–100.0)
Platelets: 193 10*3/uL (ref 150–400)
RBC: 4.15 MIL/uL — ABNORMAL LOW (ref 4.22–5.81)
RDW: 15.3 % (ref 11.5–15.5)
WBC: 10.7 10*3/uL — ABNORMAL HIGH (ref 4.0–10.5)
nRBC: 0 % (ref 0.0–0.2)

## 2019-05-01 LAB — GLUCOSE, CAPILLARY
Glucose-Capillary: 55 mg/dL — ABNORMAL LOW (ref 70–99)
Glucose-Capillary: 75 mg/dL (ref 70–99)
Glucose-Capillary: 134 mg/dL — ABNORMAL HIGH (ref 70–99)
Glucose-Capillary: 158 mg/dL — ABNORMAL HIGH (ref 70–99)
Glucose-Capillary: 203 mg/dL — ABNORMAL HIGH (ref 70–99)

## 2019-05-01 MED ORDER — POTASSIUM CHLORIDE CRYS ER 20 MEQ PO TBCR
60.0000 meq | EXTENDED_RELEASE_TABLET | ORAL | Status: AC
  Administered 2019-05-01 (×2): 60 meq via ORAL
  Filled 2019-05-01 (×2): qty 3

## 2019-05-01 MED ORDER — POTASSIUM CHLORIDE CRYS ER 20 MEQ PO TBCR
40.0000 meq | EXTENDED_RELEASE_TABLET | ORAL | Status: AC
  Administered 2019-05-01 (×2): 40 meq via ORAL
  Filled 2019-05-01 (×2): qty 2

## 2019-05-01 MED ORDER — METOLAZONE 5 MG PO TABS
5.0000 mg | ORAL_TABLET | Freq: Once | ORAL | Status: AC
  Administered 2019-05-01: 5 mg via ORAL
  Filled 2019-05-01: qty 1

## 2019-05-01 MED ORDER — INSULIN GLARGINE 100 UNIT/ML ~~LOC~~ SOLN
70.0000 [IU] | Freq: Every day | SUBCUTANEOUS | Status: DC
  Administered 2019-05-01 – 2019-05-10 (×9): 70 [IU] via SUBCUTANEOUS
  Filled 2019-05-01 (×12): qty 0.7

## 2019-05-01 NOTE — Progress Notes (Addendum)
Hypoglycemic Event  CBG: 55  Treatment: 4oz apple juice   Symptoms: asymptomatic  Follow-up CBG: Time:0805 CBG Result:75  Possible Reasons for Event: increased insulin dosage Comments: Pt resting in bed. Apple juice given and breakfast was at bedside. No distress noted. Will continue to monitor.     Leonidas Romberg

## 2019-05-01 NOTE — Evaluation (Signed)
Physical Therapy Evaluation Patient Details Name: [name] MRN: [id number] DOB: 1946/06/26 Today's Date: 05/01/2019   History of Present Illness  73 year old gentleman with chronic kidney disease baseline creatinine ~2.5), morbid obesity, RV dysfunction, L metatarsal amputation and blindness. On 2L O2 at baseline. Pt came to ED with c/o of SoB. Admitted 04/28/19 for treatment of acute on chronic CHF, AKI, AS, HTN, CAD and paroxysmal A fib.   Clinical Impression  PTA pt living alone in single story apartment with ramped entrance. Pt independent in household ambulation with cane or RW. Pt has HHAide who comes 2 hrs in morning and 3 hrs in evening to assist with ADLs and iADLs. [facility] to [facility] Mondays and Fridays. Pt limited in safe mobility by increased O2 demand historically on 2L now requiring 6 L to maintain SaO2 >90%, as well as decreased strength and endurance. Pt currently requires minA for transfers and min guard for ambulation of 20 feet with RW. PT recommending pt receive PT services through [facility] for work on strengthening and balance. Pt would benefit from [facility] 3x/wk at [facility] if possible. PT will continue to follow acutely.     Follow Up Recommendations Home health PT;Supervision - Intermittent(through [facility])    Equipment Recommendations  None recommended by PT    Recommendations for Other Services OT consult     Precautions / Restrictions Precautions Precautions: Fall Precaution Comments: hx of fall Restrictions Weight Bearing Restrictions: No      Mobility  Bed Mobility               General bed mobility comments: sitting EoB on entry NT feeding breakfast  Transfers Overall transfer level: Needs assistance Equipment used: Rolling walker (2 wheeled) Transfers: Sit to/from Stand Sit to Stand: Min assist;From elevated surface         General transfer comment: minA for power up from elevated bed surface.   Ambulation/Gait Ambulation/Gait  assistance: Min guard Gait Distance (Feet): 20 Feet Assistive device: Rolling walker (2 wheeled) Gait Pattern/deviations: Step-through pattern;Decreased step length - right;Decreased step length - left Gait velocity: slowed Gait velocity interpretation: <1.31 ft/sec, indicative of household ambulator General Gait Details: min guard for safety, slow, steady gait, verbal and tactile cues for navigating small room given blindness      Balance Overall balance assessment: Needs assistance Sitting-balance support: Feet supported;No upper extremity supported Sitting balance-Leahy Scale: Fair     Standing balance support: Bilateral upper extremity supported Standing balance-Leahy Scale: Poor Standing balance comment: requires UE support                             Pertinent Vitals/Pain Pain Assessment: No/denies pain    Home Living Family/patient expects to be discharged to:: Private residence Living Arrangements: Alone Available Help at Discharge: Family;Personal care attendant;Available PRN/intermittently Type of Home: Apartment Home Access: Ramped entrance     Home Layout: One level Home Equipment: Walker - 4 wheels;Bedside commode;Shower seat;Grab bars - tub/shower;Cane - single point Additional Comments: [facility] daily for several hours, [facility] 2x/week    Prior Function Level of Independence: Needs assistance   Gait / Transfers Assistance Needed: ambulates at home with cane or RW  ADL's / Homemaking Assistance Needed: [facility] (2 hrs am and 3 hrs pm) to assist with ADLS, IADLS, daughter does shopping, receives meals on wheels        Hand Dominance   Dominant Hand: Right    Extremity/Trunk Assessment  Upper Extremity Assessment Upper Extremity Assessment: Generalized weakness    Lower Extremity Assessment Lower Extremity Assessment: LLE deficits/detail LLE Deficits / Details: L metatarsal amputation, strength from ambulation grossly 4/5       Communication    Communication: No difficulties  Cognition Arousal/Alertness: Awake/alert Behavior During Therapy: WFL for tasks assessed/performed Overall Cognitive Status: Within Functional Limits for tasks assessed                                        General Comments General comments (skin integrity, edema, etc.): bilateal UNNA boots in place, SaO2 on 6L O2 via [place] 90-96%O2,         Assessment/Plan    PT Assessment Patient needs continued PT services  PT Problem List Cardiopulmonary status limiting activity;Decreased mobility;Decreased activity tolerance       PT Treatment Interventions DME instruction;Stair training;Functional mobility training;Therapeutic activities;Therapeutic exercise;Balance training;Cognitive remediation;Patient/family education    PT Goals (Current goals can be found in the Care Plan section)  Acute Rehab PT Goals Patient Stated Goal: go home PT Goal Formulation: With patient Time For Goal Achievement: 05/15/19 Potential to Achieve Goals: Fair    Frequency Min 3X/week   Barriers to discharge Decreased caregiver support         AM-PAC PT "6 Clicks" Mobility  Outcome Measure Help needed turning from your back to your side while in a flat bed without using bedrails?: None Help needed moving from lying on your back to sitting on the side of a flat bed without using bedrails?: A Little Help needed moving to and from a bed to a chair (including a wheelchair)?: A Little Help needed standing up from a chair using your arms (e.g., wheelchair or bedside chair)?: A Little Help needed to walk in hospital room?: A Little Help needed climbing 3-5 steps with a railing? : A Lot 6 Click Score: 18    End of Session Equipment Utilized During Treatment: Gait belt;Oxygen Activity Tolerance: Patient tolerated treatment well Patient left: in chair;with call bell/phone within reach;with chair alarm set Nurse Communication: Mobility status PT Visit Diagnosis:  Other abnormalities of gait and mobility (R26.89);Muscle weakness (generalized) (M62.81)    Time: 7341-9379 PT Time Calculation (min) (ACUTE ONLY): 26 min   Charges:   PT Evaluation $PT Eval Moderate Complexity: 1 Mod PT Treatments $Gait Training: 8-22 mins        Brianny Soulliere B. Migdalia Dk PT, DPT Acute Rehabilitation Services Pager [phone] Office [phone]   [place] 05/01/2019, 10:08 AM

## 2019-05-01 NOTE — Consult Note (Signed)
[facility]  MULTIDISCIPLINARY HEART VALVE CLINIC  CARDIOTHORACIC SURGERY CONSULTATION REPORT  Referring Provider is No ref. provider found Primary Cardiologist is No primary care provider on file. PCP is Ryan Adie, MD  Chief Complaint  Patient presents with   Shortness of Breath    HPI:  The patient is a 73 year old morbidly obese gentleman with a complicated medical history including uncontrolled diabetes with peripheral neuropathy and status post left transmetatarsal amputation in 2014, hypertension, hyperlipidemia, OSA, atrial fibrillation not on anticoagulation due to GI bleeding, hypertensive cardiomyopathy, history of nonobstructive coronary artery disease, status post permanent pacemaker placement for symptomatic bradycardia, prior intracranial bleed in 2015 resulting in the need for a temporary tracheostomy and PEG tube, complete blindness, stage IV chronic kidney disease with recent admission for acute kidney injury and exacerbation of congestive heart failure status post catheterization and CT scanning for TAVR evaluation and severe aortic stenosis.  He had an echocardiogram done in June 2020 which showed an ejection fraction of 60 to 65% with severe LVH.  The mean gradient across the aortic valve was 32 mmHg with an aortic valve area of 1.0 cm.  According to the patient and his sister who is here with him he has had longstanding exertional shortness of breath and fatigue.  He was seen by Dr. Burt [facility] for consideration of TAVR.  He underwent cardiac catheterization on 04/10/2019 which showed diffuse calcific nonobstructive coronary disease.  He had moderate pulmonary hypertension with a mean PA pressure of 41 mmHg.  The mean gradient across aortic valve was 32.3 mmHg with a peak gradient of 33 mmHg.  He subsequently underwent CT scans for TAVR evaluation on 04/24/2019.  His sister said that after those scans he developed worsening shortness of breath and peripheral  edema with a rise in his creatinine to 2.78 from his baseline of 2.08 prior to his catheterization.  His creatinine was 1.97 prior to his CT scans.  He was admitted with acute on chronic diastolic congestive heart failure on 04/28/2019.  The patient has a sister here with him today.  He lives alone but has cameras throughout his house so that his family can keep an eye on him.  He has a daughter in [place] who is his main contact person.  He has an aide that comes in during the day.  He said that he is able to move around his house using a cane or a walker despite being completely blind for the past 6 to 7 years.  He has had shortness of breath even with low-level activity for the past several years.  He denies orthopnea and PND.  He does sleep with a CPAP.  He denies any dizziness or syncope.  He has had peripheral edema.  Past Medical History:  Diagnosis Date   Arthritis    "all over" (06/23/2013)   Atrial fibrillation (HCC)    not Candidate for Coumadin d/t rectal bleeding   Benign prostatic hypertrophy    Cardiomyopathy, hypertensive ([name])     diastolic dysfunction by 3710 echo   Coronary artery disease     nonobstructive. 2 vessel. cardiac catheter in March 2005 showing 60% LAD occlusion, 30% circumflex occlusion.   Degenerative joint disease     Right knee.   Diabetes type 2, uncontrolled ([place])    Diabetic peripheral neuropathy ([name])    Diabetic ulcer of thigh ([name])    H/O Rt thigh ulcer.   Exertional shortness of breath    "comes and goes" (06/23/2013)  GERD (gastroesophageal reflux disease)    Gout    Herpes    Hyperlipidemia    Hypertension    Hypotension 09/05/2013   Incarcerated ventral hernia    H/O. S/P repair ventral hernia repair 07/2005.   LBBB (left bundle branch block)    Legally blind    Morbid obesity ([name])    Obstructive sleep apnea    On CPAP. Last sleep study (06/2009) - Severe obstructive sleep apnea/hypopnea syndrome, apnea-hypopnea index  70.5 per hour with non positional events moderately loud snoring and oxygen desaturation to a nadir of 85% on room air.   Occult blood positive stool     History of in August 2007.  colonoscopy in January 2008 showing normal colon with fair inadequate prep. By Dr. Silvano [facility].   Pacemaker 11-12-2003    secondary to bradycardia   Sepsis ([name]) 08/26/2013    Past Surgical History:  Procedure Laterality Date   AMPUTATION Left 06/22/2013   Procedure: AMPUTATION FOOT;  Surgeon: Ryan Minion, MD;  Location: [name];  Service: Orthopedics;  Laterality: Left;  Left Midfoot Amputation   CARDIAC CATHETERIZATION  10/22/2003   Dilatation of the LV with preserved LV systolic function. Tortous aorta, but no AAA. Moderate disease with 50-60% area of narrowing in the circumflex after the second OM and 70% mid narrowing in the LAD. No intervention.   CARDIOVASCULAR STRESS TEST  01/23/2010   Perfusion defect seen in inferior myocardial region is consistent with diaphragmatic attenuation. Remaining myocardium demonstrates normal myocardial perfusion with no evidence of ischemia or infarct. Stress ECG was non-diagnostic due to LBBB.   CATARACT EXTRACTION, BILATERAL Bilateral    ESOPHAGOGASTRODUODENOSCOPY N/A 10/12/2013   Procedure: ESOPHAGOGASTRODUODENOSCOPY (EGD);  Surgeon: Ryan Ober, MD;  Location: [name];  Service: General;  Laterality: N/A;  bedside   EYE SURGERY     FOOT AMPUTATION THROUGH METATARSAL Left 06/22/2013   midfoot/notes 06/22/2013   HERNIA REPAIR     INSERT / REPLACE / REMOVE PACEMAKER  11/12/2003   Secondary to symptomatic bradycardia. DDDR pacemaker. By Dr. Rollene [facility].   INSERT / REPLACE / REMOVE PACEMAKER  11-11-12   "old pacemaker died; had new one put in" (15-Jul-2013)   Pacemaker lead revision  12/2003    likely microdislodgment  of right ventricular lead   PEG PLACEMENT N/A 10/12/2013   Procedure: PERCUTANEOUS ENDOSCOPIC GASTROSTOMY (PEG) PLACEMENT;  Surgeon: Ryan Ober, MD;   Location: [name];  Service: General;  Laterality: N/A;   PERMANENT PACEMAKER GENERATOR CHANGE N/A 11/03/2012   Procedure: PERMANENT PACEMAKER GENERATOR CHANGE;  Surgeon: Sanda Klein, MD;  Location: [facility];  Service: Cardiovascular;  Laterality: N/A;   REFRACTIVE SURGERY Bilateral    RIGHT HEART CATH N/A 09/05/2018   Procedure: RIGHT HEART CATH;  Surgeon: Larey Dresser, MD;  Location: [facility];  Service: Cardiovascular;  Laterality: N/A;   RIGHT/LEFT HEART CATH AND CORONARY ANGIOGRAPHY N/A 04/10/2019   Procedure: RIGHT/LEFT HEART CATH AND CORONARY ANGIOGRAPHY;  Surgeon: [name], MD;  Location: [facility];  Service: Cardiovascular;  Laterality: N/A;   TEE WITHOUT CARDIOVERSION N/A 09/05/2018   Procedure: TRANSESOPHAGEAL ECHOCARDIOGRAM (TEE);  Surgeon: Larey Dresser, MD;  Location: [facility];  Service: Cardiovascular;  Laterality: N/A;   TRANSTHORACIC ECHOCARDIOGRAM  01/01/2013   EF 50-55%, ventricular septum-thickness moderately increased   VENOUS DOPPLER  01/17/2013   No evidence of thrombus or thrombophlebitis. Left GSVs appear enlarged and demonstrate valvular insuffiiciency. Bilateral SSVs appeared patent with no valvular  insufficiency seen.   VENTRAL HERNIA REPAIR  07/2005    S/P laparotomy, lysis of adhesions, repair of incarcerated ventral hernia with  partial omentectomy.. Performed by Dr. Marlou Starks.    Family History  Problem Relation Age of Onset   Coronary artery disease Sister    Heart disease Sister    Allergies Sister    Asthma Brother    Rheum arthritis Brother     Social History   Socioeconomic History   Marital status: Divorced    Spouse name: Not on file   Number of children: 4   Years of education: 12   Highest education level: 12th grade  Occupational History   Occupation: disabled  Scientist, product/process development strain: Somewhat hard   Food insecurity    Worry: Sometimes true    Inability:  Sometimes true   Transportation needs    Medical: No    Non-medical: Yes  Tobacco Use   Smoking status: Former Smoker    Packs/day: 0.00    Years: 0.00    Pack years: 0.00    Types: Cigarettes    Quit date: 08/17/2003    Years since quitting: 15.7   Smokeless tobacco: Never Used   Tobacco comment: started at age 27.  only smoked on occ- very rarely.0  Substance and Sexual Activity   Alcohol use: Yes    Comment: 06/23/2013 "drank some; never had a problem w/it"   Drug use: No   Sexual activity: Never  Lifestyle   Physical activity    Days per week: Not on file    Minutes per session: Not on file   Stress: Not on file  Relationships   Social connections    Talks on phone: Not on file    Gets together: Not on file    Attends religious service: Not on file    Active member of club or organization: Not on file    Attends meetings of clubs or organizations: Not on file    Relationship status: Not on file   Intimate partner violence    Fear of current or ex partner: Not on file    Emotionally abused: Not on file    Physically abused: Not on file    Forced sexual activity: Not on file  Other Topics Concern   Not on file  Social History Narrative   12/10- reports issues with housing- states it is not disability friendly but has ramp, first floor apt, and has grab bars/assistive devices provided by [facility].  Reports that he has concerns with having enough food but gets meals on wheels, eats at [facility] 3x a week, and has personal aid who cooks and shops for him.  Reports concerns with transportation but gets [facility] transport for all of his medical appts and his family or an aid helps him with other transportation needs.    Current Facility-Administered Medications  Medication Dose Route Frequency Provider Last Rate Last Dose   0.9 %  sodium chloride infusion  250 mL Intravenous PRN Bensimhon, Shaune Pascal, MD       acetaminophen (TYLENOL) tablet 650 mg  650 mg Oral Q4H PRN  Bensimhon, Shaune Pascal, MD       albuterol (PROVENTIL) (2.5 MG/3ML) 0.083% nebulizer solution 3 mL  3 mL Inhalation Q6H PRN Bensimhon, Shaune Pascal, MD       amLODipine (NORVASC) tablet 10 mg  10 mg Oral Daily Bensimhon, Shaune Pascal, MD   10 mg at 05/01/19 0923   antiseptic oral  rinse (BIOTENE) solution 15 mL  15 mL Mouth Rinse TID PRN Bensimhon, Shaune Pascal, MD       aspirin EC tablet 81 mg  81 mg Oral Daily Bensimhon, Shaune Pascal, MD   81 mg at 05/01/19 0630   Chlorhexidine Gluconate Cloth 2 % PADS 6 each  6 each Topical Daily Bensimhon, Shaune Pascal, MD   6 each at 05/01/19 1601   cholecalciferol (VITAMIN D3) tablet 1,000 Units  1,000 Units Oral Daily Bensimhon, Shaune Pascal, MD   1,000 Units at 05/01/19 0932   citalopram (CELEXA) tablet 20 mg  20 mg Oral Daily Bensimhon, Shaune Pascal, MD   20 mg at 05/01/19 3557   famotidine (PEPCID) tablet 20 mg  20 mg Oral Daily Bensimhon, Shaune Pascal, MD   20 mg at 05/01/19 3220   febuxostat (ULORIC) tablet 80 mg  80 mg Oral Daily Bensimhon, Shaune Pascal, MD   80 mg at 05/01/19 1007   furosemide (LASIX) 120 mg in dextrose 5 % 50 mL IVPB  120 mg Intravenous Q8H Bensimhon, Shaune Pascal, MD 62 mL/hr at 05/01/19 1218 120 mg at 05/01/19 1218   gabapentin (NEURONTIN) capsule 300 mg  300 mg Oral BID Bensimhon, Shaune Pascal, MD   300 mg at 05/01/19 0921   heparin injection 5,000 Units  5,000 Units Subcutaneous Q8H Bensimhon, Shaune Pascal, MD   5,000 Units at 05/01/19 2542   hydrALAZINE (APRESOLINE) tablet 50 mg  50 mg Oral TID Jolaine Artist, MD   50 mg at 05/01/19 7062   insulin aspart (novoLOG) injection 0-20 Units  0-20 Units Subcutaneous TID WC Bensimhon, Shaune Pascal, MD   3 Units at 05/01/19 1214   insulin glargine (LANTUS) injection 70 Units  70 Units Subcutaneous QHS Larey Dresser, MD       MEDLINE mouth rinse  15 mL Mouth Rinse BID Bensimhon, Shaune Pascal, MD   15 mL at 05/01/19 0924   metoprolol succinate (TOPROL-XL) 24 hr tablet 50 mg  50 mg Oral Daily Bensimhon, Shaune Pascal, MD   50 mg  at 05/01/19 0923   ondansetron (ZOFRAN) injection 4 mg  4 mg Intravenous Q6H PRN Bensimhon, Shaune Pascal, MD       polyethylene glycol (MIRALAX / GLYCOLAX) packet 17 g  17 g Oral Daily PRN Bensimhon, Shaune Pascal, MD       potassium chloride SA (K-DUR) CR tablet 60 mEq  60 mEq Oral Q4H Larey Dresser, MD       rosuvastatin (CRESTOR) tablet 20 mg  20 mg Oral Daily Bensimhon, Shaune Pascal, MD   20 mg at 05/01/19 3762   senna-docusate (Senokot-S) tablet 2 tablet  2 tablet Oral BID Bensimhon, Shaune Pascal, MD   2 tablet at 05/01/19 8315   sodium chloride flush (NS) 0.9 % injection 3 mL  3 mL Intravenous Q12H Bensimhon, Shaune Pascal, MD   3 mL at 05/01/19 1761   sodium chloride flush (NS) 0.9 % injection 3 mL  3 mL Intravenous PRN Bensimhon, Shaune Pascal, MD   3 mL at 04/29/19 0234   Facility-Administered Medications Ordered in Other Encounters  Medication Dose Route Frequency Provider Last Rate Last Dose   chlorhexidine gluconate (MEDLINE KIT) (PERIDEX) 0.12 % solution 15 mL  15 mL Mouth Rinse BID Chesley Mires, MD       MEDLINE mouth rinse  15 mL Mouth Rinse QID Chesley Mires, MD        No Known Allergies    Review of Systems:  General:  normal appetite, + decreased energy, + weight gain, no  weight loss, no fever  Cardiac:  no chest pain with exertion, no chest pain at rest, +SOB with low level exertion, no resting SOB, no PND, no orthopnea, no palpitations, + arrhythmia, + atrial fibrillation, + LE edema, no dizzy spells, no syncope  Respiratory:  + exertional shortness of breath, no home oxygen, no productive cough, no dry cough, no bronchitis, no wheezing, no hemoptysis, no asthma, no pain with inspiration or cough, + sleep apnea, + CPAP at night  GI:   no difficulty swallowing, no reflux, no frequent heartburn, no hiatal hernia, no abdominal pain, no constipation, no diarrhea, no hematochezia, no hematemesis, no melena  GU:   no dysuria,  no frequency, no urinary tract infection, no hematuria, no  enlarged prostate, no kidney stones, + chronic kidney disease  Vascular:  no pain suggestive of claudication, no pain in feet, no leg cramps, no varicose veins, no DVT, no non-healing foot ulcer  Neuro:   + remote stroke, no TIA's, no seizures, no headaches, no temporary blindness one eye,  no slurred speech, + peripheral neuropathy, no chronic pain, + instability of gait, + memory/cognitive dysfunction  Musculoskeletal: + arthritis, no joint swelling, no myalgias, + difficulty walking, + reduced mobility   Skin:   no rash, no itching, no skin infections, no pressure sores or ulcerations  Psych:   no anxiety, no depression, no nervousness, no unusual recent stress  Eyes:   no blurry vision, no floaters, no recent vision changes, does not wear glasses or contacts, Completely blind.  ENT:   no hearing loss, no loose or painful teeth, no dentures, last saw dentist every six months.  Hematologic:  no easy bruising, no abnormal bleeding, no clotting disorder, no frequent epistaxis  Endocrine:  + diabetes, does check CBG's at home        Physical Exam:   BP 134/68 (BP Location: Right Arm)    Pulse 71    Temp 98 F (36.7 C) (Oral)    Resp (!) 25    Ht 6' (1.829 m)    Wt (!) 143.2 kg    SpO2 90%    BMI 42.82 kg/m   General:  Morbidly obese, sitting up in chair, no distress  HEENT:  Unremarkable, teeth in good condition  Neck:   no JVD, no bruits, no adenopathy   Chest:   clear to auscultation, symmetrical breath sounds, no wheezes, no rhonchi   CV:   RRR, grade III/VI crescendo/decrescendo murmur heard best at RSB,  no diastolic murmur  Abdomen:  soft, obese, non-tender, no masses, large panniculus  Extremities:  warm, well-perfused, pulses not palpable in feet, moderate LE edema, legs wrapped  Rectal/GU  Deferred  Neuro:   Grossly non-focal and symmetrical throughout  Skin:   Clean and dry, no rashes, no breakdown   Diagnostic Tests:  Physicians  Panel Physicians Referring Physician Case  Authorizing Physician  [name], MD (Primary)    Procedures  RIGHT/LEFT HEART CATH AND CORONARY ANGIOGRAPHY  Conclusion    There is severe aortic valve stenosis.   1. Diffuse calcific nonobstructive CAD 2. Calcified, restricted aortic valve with moderately severe aortic stenosis with mean transvalvular gradient 32 mmHg 3. Moderate pulmonary HTN with mean PAP 41 mmHg, transpulmonic gradient 16 mmHg, and PVR 2.1 Woods units  Recommend: continued TAVR evaluation with CTA studies and formal surgical consultation  Contrast used: 20 cc   Indications  Severe aortic stenosis [  I35.0 (ICD-10-CM)]  Procedural Details  Technical Details INDICATION: Severe aortic stenosis. Complex 73 yo male with morbid obesity, pulmonary HTN, PPM, chronic diastolic HF and progressive, stage D1 aortic stenosis. He is referred for R/L heart catheterization for Pre-TAVR evaluation.  PROCEDURAL DETAILS: There was an indwelling IV in a left antecubital vein. Using normal sterile technique, the IV was changed out for a 5 Fr brachial sheath over a 0.018 inch wire. The right wrist was then prepped, draped, and anesthetized with 1% lidocaine. Using the modified Seldinger technique a 5/6 French Slender sheath was placed in the right radial artery. Intra-arterial verapamil was administered through the radial artery sheath. IV heparin was administered after a JR4 catheter was advanced into the central aorta. A Swan-Ganz catheter was used for the right heart catheterization. Standard protocol was followed for recording of right heart pressures and sampling of oxygen saturations. Fick cardiac output was calculated. Standard Judkins catheters were used for selective coronary angiography. LV pressure is recorded and an aortic valve pullback is performed. There were no immediate procedural complications. The patient was transferred to the post catheterization recovery area for further monitoring.    Estimated blood loss  <50 mL.   During this procedure medications were administered to achieve and maintain moderate conscious sedation while the patient's heart rate, blood pressure, and oxygen saturation were continuously monitored and I was present face-to-face 100% of this time.  Medications (Filter: Administrations occurring from 04/10/19 1200 to 04/10/19 1305) (important)  Continuous medications are totaled by the amount administered until 04/10/19 1305.  Medication Rate/Dose/Volume Action  Date Time   midazolam (VERSED) injection (mg) 0.5 mg Given 04/10/19 1229   Total dose as of 04/10/19 1305        0.5 mg        fentaNYL (SUBLIMAZE) injection (mcg) 12.5 mcg Given 04/10/19 1230   Total dose as of 04/10/19 1305        12.5 mcg        Heparin (Porcine) in NaCl 1000-0.9 UT/500ML-% SOLN (mL) 500 mL Given 04/10/19 1230   Total dose as of 04/10/19 1305 500 mL Given 1230   1,000 mL        Radial Cocktail/Verapamil only (mL) 10 mL Given 04/10/19 1231   Total dose as of 04/10/19 1305        10 mL        lidocaine (PF) (XYLOCAINE) 1 % injection (mL) 5 mL Given 04/10/19 1234   Total dose as of 04/10/19 1305        5 mL        heparin injection (Units) 6,000 Units Given 04/10/19 1245   Total dose as of 04/10/19 1305        6,000 Units        iohexol (OMNIPAQUE) 350 MG/ML injection (mL) 20 mL Given 04/10/19 1249   Total dose as of 04/10/19 1305        20 mL        Sedation Time  Sedation Time Physician-1: 21 minutes 5 seconds  Contrast  Medication Name Total Dose  iohexol (OMNIPAQUE) 350 MG/ML injection 20 mL    Radiation/Fluoro  Fluoro time: 5.2 (min) DAP: 21.6 (Gycm2) Cumulative Air Kerma: 326.7 (mGy)  Coronary Findings  Diagnostic Dominance: Right Left Anterior Descending  The LAD is diffusely calcified with no significant stenosis in the LAD proper or the diagonal branches  Mid LAD to Dist LAD lesion 30% stenosed  Mid LAD to Dist LAD lesion is 30%  stenosed.  Left Circumflex  Mild diffuse  irregularity, diffuse calcification, no high-grade obstruction throughout the circumflex distribution  Right Coronary Artery  The RCA is diffusely calcified but patent throughout. There is mild stenosis in the mid vessel and mild to moderate stenosis in the distal vessel. There is no high-grade stenosis identified throughout the RCA distribution. The PDA and PLA branches are patent.  Prox RCA to Mid RCA lesion 30% stenosed  Prox RCA to Mid RCA lesion is 30% stenosed.  Dist RCA lesion 50% stenosed  Dist RCA lesion is 50% stenosed.  Intervention  No interventions have been documented. Left Heart  Aortic Valve There is severe aortic valve stenosis. The aortic valve is calcified. There is restricted aortic valve motion.  Coronary Diagrams  Diagnostic Dominance: Right  Intervention  Implants   No implant documentation for this case.  Syngo Images  Show images for CARDIAC CATHETERIZATION  Images on Long Term Storage  Show images for Fumio, Vandam to Procedure Log  Procedure Log    Hemo Data   Most Recent Value  Fick Cardiac Output 7.47 L/min  Fick Cardiac Output Index 2.91 (L/min)/BSA  Aortic Mean Gradient 32.34 mmHg  Aortic Peak Gradient 33 mmHg  Aortic Valve Area 1.60  Aortic Value Area Index 0.62 cm2/BSA  RA A Wave 19 mmHg  RA V Wave 19 mmHg  RA Mean 18 mmHg  RV Systolic Pressure 69 mmHg  RV Diastolic Pressure 8 mmHg  RV EDP 15 mmHg  PA Systolic Pressure 71 mmHg  PA Diastolic Pressure 28 mmHg  PA Mean 41 mmHg  AO Systolic Pressure 814 mmHg  AO Diastolic Pressure 74 mmHg  AO Mean 481 mmHg  LV Systolic Pressure 856 mmHg  LV Diastolic Pressure 15 mmHg  LV EDP 25 mmHg  AOp Systolic Pressure 314 mmHg  AOp Diastolic Pressure 73 mmHg  AOp Mean Pressure 99 mmHg  LVp Systolic Pressure 970 mmHg  LVp Diastolic Pressure 12 mmHg  LVp EDP Pressure 26 mmHg  QP/QS 1.21  TPVR Index 14.1 HRUI  TSVR Index 37.15 HRUI  TPVR/TSVR Ratio 0.38   Patient Name:    [name] Date of Exam: 04/28/2019 Medical Rec #:  [id number]        Height:       72.0 in Accession #:    [id number]       Weight:       311.0 lb Date of Birth:  [birth year]         BSA:          2.57 m Patient Age:    22 years         BP:           141/96 mmHg Patient Gender: M                HR:           68 bpm. Exam Location:  Inpatient    Procedure: 2D Echo                     STAT ECHO Reported to: Dr. Radford Pax on 04/28/2019 4:08:00 PM                aortic stenosis.  Indications:    acute systolic chf 676.72   History:        Patient has prior history of Echocardiogram examinations, most  recent 02/06/2019.   Sonographer:    Johny Chess Referring Phys: [place]    1. The left ventricle has normal systolic function with an ejection fraction of 60-65%. The cavity size was normal. There is severely increased left ventricular wall thickness. Left ventricular diastolic Doppler parameters are consistent with  pseudonormalization. Elevated left atrial and left ventricular end-diastolic pressures No evidence of left ventricular regional wall motion abnormalities.  2. The right ventricle has normal systolic function. The cavity was normal. There is no increase in right ventricular wall thickness.  3. Left atrial size was moderately dilated.  4. The aortic valve is tricuspid. Moderate thickening of the aortic valve. Severe calcifcation of the aortic valve. Moderate-severe stenosis of the aortic valve.  5. The aorta is normal unless otherwise noted.  6. The inferior vena cava was dilated in size with >50% respiratory variability.  FINDINGS  Left Ventricle: The left ventricle has normal systolic function, with an ejection fraction of 60-65%. The cavity size was normal. There is severely increased left ventricular wall thickness. Left ventricular diastolic Doppler parameters are consistent  with pseudonormalization. Elevated left  atrial and left ventricular end-diastolic pressures No evidence of left ventricular regional wall motion abnormalities..  Right Ventricle: The right ventricle has normal systolic function. The cavity was normal. There is no increase in right ventricular wall thickness. Pacing wire/catheter visualized in the right ventricle.  Left Atrium: Left atrial size was moderately dilated.  Right Atrium: Right atrial size was normal in size. Right atrial pressure is estimated at 10 mmHg.  Interatrial Septum: No atrial level shunt detected by color flow Doppler.  Pericardium: There is no evidence of pericardial effusion.  Mitral Valve: The mitral valve is normal in structure. Mitral valve regurgitation is trivial by color flow Doppler.  Tricuspid Valve: The tricuspid valve is normal in structure. Tricuspid valve regurgitation is trivial by color flow Doppler.  Aortic Valve: The aortic valve is tricuspid Moderate thickening of the aortic valve. Severe calcifcation of the aortic valve. Aortic valve regurgitation was not visualized by color flow Doppler. There is Moderate-severe stenosis of the aortic valve, with  a calculated valve area of 1.25 cm.  Pulmonic Valve: The pulmonic valve was normal in structure. Pulmonic valve regurgitation is not visualized by color flow Doppler.  Aorta: The aorta is normal unless otherwise noted.  Venous: The inferior vena cava measures 2.20 cm, is dilated in size with greater than 50% respiratory variability.    +--------------+--------++  LEFT VENTRICLE            +----------------+---------++ +--------------+--------++  Diastology                    PLAX 2D                   +----------------+---------++ +--------------+--------++  LV e' lateral:   7.83 cm/s    LVIDd:         5.30 cm    +----------------+---------++ +--------------+--------++  LV E/e' lateral: 17.2         LVIDs:         3.00 cm     +----------------+---------++ +--------------+--------++  LV e' medial:    6.85 cm/s    LV PW:         1.40 cm    +----------------+---------++ +--------------+--------++  LV E/e' medial:  19.7         LV IVS:        1.50 cm    +----------------+---------++ +--------------+--------++  LVOT diam:     2.30 cm    +--------------+--------++  LV SV:         100 ml     +--------------+--------++  LV SV Index:   36.63      +--------------+--------++  LVOT Area:     4.15 cm   +--------------+--------++                            +--------------+--------++  +---------------+----------++  RIGHT VENTRICLE              +---------------+----------++  RV S prime:     13.10 cm/s   +---------------+----------++  +---------------+-------++-----------++  LEFT ATRIUM              Index         +---------------+-------++-----------++  LA diam:        4.90 cm  1.91 cm/m    +---------------+-------++-----------++  LA Vol (A2C):   91.5 ml  35.60 ml/m   +---------------+-------++-----------++  LA Vol (A4C):   93.5 ml  36.38 ml/m   +---------------+-------++-----------++  LA Biplane Vol: 93.8 ml  36.49 ml/m   +---------------+-------++-----------++ +------------+---------++-----------++  RIGHT ATRIUM            Index         +------------+---------++-----------++  RA Pressure: 3.00 mmHg                +------------+---------++-----------++  RA Area:     23.10 cm                +------------+---------++-----------++  RA Volume:   69.10 ml   26.88 ml/m   +------------+---------++-----------++  +------------------+------------++  AORTIC VALVE                      +------------------+------------++  AV Area (Vmax):    1.15 cm       +------------------+------------++  AV Area (Vmean):   1.14 cm       +------------------+------------++  AV Area (VTI):     1.25 cm       +------------------+------------++  AV Vmax:           361.50 cm/s    +------------------+------------++  AV  Vmean:          256.000 cm/s   +------------------+------------++  AV VTI:            0.836 m        +------------------+------------++  AV Peak Grad:      52.3 mmHg      +------------------+------------++  AV Mean Grad:      30.0 mmHg      +------------------+------------++  LVOT Vmax:         99.85 cm/s     +------------------+------------++  LVOT Vmean:        70.400 cm/s    +------------------+------------++  LVOT VTI:          0.252 m        +------------------+------------++  LVOT/AV VTI ratio: 0.30           +------------------+------------++   +-------------+-------++  AORTA                   +-------------+-------++  Ao Root diam: 3.30 cm   +-------------+-------++  +--------------+----------++  +---------------+---------++  MITRAL VALVE                  TRICUSPID VALVE             +--------------+----------++  +---------------+---------++  MV Area (PHT): 3.99 cm       Estimated RAP:  3.00 mmHg   +--------------+----------++  +---------------+---------++  MV PHT:        55.10 msec   +--------------+----------++  +--------------+-------+  MV Decel Time: 190 msec       SHUNTS                  +--------------+----------++  +--------------+-------+ +--------------+-----------++  Systemic VTI:  0.25 m    MV E velocity: 135.00 cm/s   +--------------+-------+ +--------------+-----------++  Systemic Diam: 2.30 cm   MV A velocity: 99.10 cm/s    +--------------+-------+ +--------------+-----------++  MV E/A ratio:  1.36          +--------------+-----------++  +---------+-------+  IVC                +---------+-------+  IVC diam: 2.20 cm  +---------+-------+    Fransico Him MD Electronically signed by Fransico Him MD Signature Date/Time: 04/28/2019/5:08:57 PM    ADDENDUM REPORT: 04/24/2019 14:55  CLINICAL DATA:  Aortic stenosis  EXAM: Cardiac TAVR CT  TECHNIQUE: The patient was scanned on a Siemens Force 295 slice scanner. A 120 kV retrospective scan  was triggered in the descending thoracic aorta at 111 HU's. Gantry rotation speed was 270 msecs and collimation was .9 mm. No beta blockade or nitro were given. The 3D data set was reconstructed in 5% intervals of the R-R cycle. Systolic and diastolic phases were analyzed on a dedicated work station using MPR, MIP and VRT modes. The patient received 80 cc of contrast.  FINDINGS: Aortic Valve: Calcified tri leaflet with restricted leaflet motion  Aorta: Severe calcific atherosclerosis Normal arch vessels mild aortic root dilatation  Sinotubular Junction: 30 mm  Ascending Thoracic Aorta: 40 mm  Aortic Arch: 31 mm  Descending Thoracic Aorta: 28 mm  Sinus of Valsalva Measurements:  Non-coronary: 33.2 mm  Right - coronary: 30.8 mm  Left - coronary: 34.5 mm  Coronary Artery Height above Annulus:  Left Main: 10.5 mm somewhat shallow  Right Coronary: 14.6 mm above annulus  Virtual Basal Annulus Measurements:  Maximum/Minimum Diameter: 25.2 mm x 28.9 mm  Perimeter: 87 mm  Area: 581 mm2  Coronary Arteries: LM a bit shallow  Optimum Fluoroscopic Angle for Delivery: LAO 17 Caudal 16 degrees  IMPRESSION: 1. Calcified tri leaflet aortic valve with annular area of 581 mm2 suitable for a 29 mm Sapien 3 Ultra valve.  2. Mild aortic root dilatation 4.0 cm with severe calcific atherosclerosis  3. Coronary arteries sufficient height above annulus for deployment although LM a bit shallow at 10.5 mm  4.  Pacing wires in RA/RV  5.  No LAA thrombus  6. Optimum angiographic angle for deployment LAO 17 Caudal 16 degrees  Jenkins Rouge   Electronically Signed   By: Jenkins Rouge M.D.   On: 04/24/2019 14:55   Addended by Josue Hector, MD on 04/24/2019 2:57 PM    Study Result  EXAM: OVER-READ INTERPRETATION  CT CHEST  The following report is an over-read performed by radiologist Dr. Vinnie Langton of [facility], [place] on  04/24/2019. This over-read does not include interpretation of cardiac or coronary anatomy or pathology. The coronary calcium score/coronary CTA interpretation by the cardiologist is attached.  COMPARISON:  Chest CT 06/20/2017.  FINDINGS: Extracardiac findings will be described separately under dictation for contemporaneously obtained CTA chest, abdomen and pelvis.  IMPRESSION: Please see separate dictation for contemporaneously obtained CTA chest, abdomen and pelvis 04/24/2019 for full description  of relevant extracardiac findings.  Electronically Signed: By: Vinnie Langton M.D. On: 04/24/2019 13:53      ADDENDUM REPORT: 04/25/2019 10:23  ADDENDUM: There was an error in the original dictation. Specifically, a minimal aortic diameter is 21 x 20 mm.   Electronically Signed   By: Vinnie Langton M.D.   On: 04/25/2019 10:23   Addended by Etheleen Mayhew, MD on 04/25/2019 10:25 AM    Study Result  CLINICAL DATA:  73 year old male with history of severe aortic valve stenosis. Preprocedural study prior to potential transcatheter aortic valve replacement (TAVR) procedure.  EXAM: CT ANGIOGRAPHY CHEST, ABDOMEN AND PELVIS  TECHNIQUE: Multidetector CT imaging through the chest, abdomen and pelvis was performed using the standard protocol during bolus administration of intravenous contrast. Multiplanar reconstructed images and MIPs were obtained and reviewed to evaluate the vascular anatomy.  CONTRAST:  164m OMNIPAQUE IOHEXOL 350 MG/ML SOLN  COMPARISON:  Chest CT 06/20/2017.  FINDINGS: CTA CHEST FINDINGS  Cardiovascular: Heart size is enlarged. There is no significant pericardial fluid, thickening or pericardial calcification. There is aortic atherosclerosis, as well as atherosclerosis of the great vessels of the mediastinum and the coronary arteries, including calcified atherosclerotic plaque in the left main, left anterior descending, left  circumflex and right coronary arteries. Severe calcifications of the aortic valve. Right-sided pacemaker/AICD with lead tips terminating in the right atrial appendage and right ventricular apex.  Mediastinum/Lymph Nodes: No pathologically enlarged mediastinal or hilar lymph nodes. Please note that accurate exclusion of hilar adenopathy is limited on noncontrast CT scans. Esophagus is unremarkable in appearance. No axillary lymphadenopathy.  Lungs/Pleura: 2 tiny 3 mm nodules in the right upper lobe on axial images 91 and 94 of series 16. No larger more suspicious appearing pulmonary nodules or masses are noted. No acute consolidative airspace disease. No pleural effusions.  Musculoskeletal/Soft Tissues: There are no aggressive appearing lytic or blastic lesions noted in the visualized portions of the skeleton.  CTA ABDOMEN AND PELVIS FINDINGS  Hepatobiliary: No suspicious cystic or solid hepatic lesions. No intra or extrahepatic biliary ductal dilatation. Tiny calcified gallstones lying dependently in the gallbladder. No findings to suggest an acute cholecystitis at this time.  Pancreas: No pancreatic mass. No pancreatic ductal dilatation. No pancreatic or peripancreatic fluid collections or inflammatory changes.  Spleen: Unremarkable.  Adrenals/Urinary Tract: Multiple low-attenuation lesions in both kidneys, compatible with simple cysts, largest of which is in the interpolar region of the right kidney measuring 4.8 cm in diameter. In addition, in the left kidney there are several intermediate attenuation lesions which are incompletely characterized, largest of which is in the lower pole measuring 4.1 x 3.6 cm (42 HU). Bilateral adrenal glands are normal in appearance. No hydroureteronephrosis. Urinary bladder is unremarkable in appearance.  Stomach/Bowel: Normal appearance of the stomach. No pathologic dilatation of small bowel or colon. Normal  appendix.  Vascular/Lymphatic: Aortic atherosclerosis, without evidence of aneurysm or dissection in the abdominal or pelvic vasculature. No lymphadenopathy noted in the abdomen or pelvis.  Reproductive: Prostate gland and seminal vesicles are unremarkable in appearance.  Other: No significant volume of ascites.  No pneumoperitoneum.  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 There are no other aggressive appearing lytic or blastic lesions noted in the visualized portions of the skeleton.  VASCULAR MEASUREMENTS PERTINENT TO TAVR:  AORTA:  Minimal Aortic  Diameter-2.1 x 2.0 mm  Severity of Aortic Calcification-moderate to severe  RIGHT PELVIS:  Right Common Iliac Artery -  Minimal Diameter-10.4 x 13.8 mm  Tortuosity-mild-to-moderate  Calcification-severe  Right External Iliac Artery -  Minimal Diameter-7.5 x 8.6 mm  Tortuosity-moderate  Calcification-moderate  Right Common Femoral Artery -  Minimal Diameter-7.5 x 7.5 mm  Tortuosity-mild  Calcification-moderate to severe  LEFT PELVIS:  Left Common Iliac Artery -  Minimal Diameter-9.6 x 10.6 mm  Tortuosity-mild-to-moderate  Calcification-severe  Left External Iliac Artery -  Minimal Diameter-8.6 x 8.3 mm  Tortuosity-moderate  Calcification-moderate  Left Common Femoral Artery -  Minimal Diameter-8.1 x 8.1 mm  Tortuosity-mild  Calcification-moderate  Review of the MIP images confirms the above findings.  IMPRESSION: 1. Vascular findings and measurements pertinent to potential TAVR procedure, as above. 2. Severe thickening and calcification of the aortic valve, compatible with the reported clinical history of aortic stenosis. 3. Aortic  atherosclerosis, in addition to left main and 3 vessel coronary artery disease. 4. Multiple indeterminate lesions in the left kidney, favored to represent proteinaceous/hemorrhagic cysts. These could be definitively characterized with nonemergent MRI of the abdomen with and without IV gadolinium if of clinical concern. 5. Two tiny 3 mm right upper lobe pulmonary nodules, nonspecific, but statistically likely benign. No follow-up needed if patient is low-risk (and has no known or suspected primary neoplasm). Non-contrast chest CT can be considered in 12 months if patient is high-risk. This recommendation follows the consensus statement: Guidelines for Management of Incidental Pulmonary Nodules Detected on CT Images: From the Fleischner Society 2017; Radiology 2017; 284:228-243. 6. Cholelithiasis without evidence of acute cholecystitis at this time. 7. Additional incidental findings, as above  Electronically Signed: By: Vinnie Langton M.D. On: 04/24/2019 16:10       STS Risk Calculator (Isolated AVR) Risk of Mortality: 12.494% Renal Failure: 48.130% Permanent Stroke: 1.295% Prolonged Ventilation: 45.609% DSW Infection: 0.975% Reoperation: 4.691% Morbidity or Mortality: 60.104% Short Length of Stay: 4.506% Long Length of Stay:  Impression:  This 73 year old gentleman has stage D, severe, symptomatic aortic stenosis with New York Heart Association class IV symptoms of exertional fatigue and shortness of breath, admitted with acute on chronic diastolic congestive heart failure and acute kidney injury following cardiac catheterization and CTA studies.  I have personally reviewed his 2D echocardiogram, cardiac catheterization, and CTA studies.  Echocardiogram shows severe calcification and restriction of all 3 aortic valve leaflets with a mean transvalvular gradient of 32 mmHg and a dimensionless index of 0.22 consistent with severe aortic stenosis.  Left ventricular  ejection fraction is normal with severe LVH.  His cardiac catheterization shows nonobstructive coronary disease with a mean gradient of 32 mmHg across the aortic valve and moderate pulmonary hypertension with a PA pressure of 71/28 with a mean of 44 mmHg.  I think aortic valve replacement certainly has a potential to improve management of his congestive heart failure symptoms and his quality of life.  He would not be a candidate for open surgical aortic valve replacement but I think TAVR is a reasonable alternative although his operative risk would be elevated due to his multiple severe comorbidities.  His gated cardiac CTA shows anatomy suitable for transcatheter aortic valve placement using a 29 mm Sapien 3 valve.  His abdominal pelvic CTA shows adequate pelvic vascular anatomy to allow transfemoral insertion.  The patient and his sister were counseled at length regarding treatment alternatives for management of severe symptomatic aortic stenosis. The risks and benefits of surgical intervention has  been discussed in detail. Long-term prognosis with medical therapy was discussed. Alternative approaches such as conventional surgical aortic valve replacement, transcatheter aortic valve replacement, and palliative medical therapy were compared and contrasted at length. This discussion was placed in the context of the patient's own specific clinical presentation and past medical history. All of their questions been addressed.  He would like to proceed with transcatheter aortic valve replacement if we feel that he is a candidate for that procedure.    Following the decision to proceed with transcatheter aortic valve replacement, a discussion was held regarding what types of management strategies would be attempted intraoperatively in the event of life-threatening complications, including whether or not the patient would be considered a candidate for the use of cardiopulmonary bypass and/or conversion to open  sternotomy for attempted surgical intervention.  He is not a candidate for emergent sternotomy to manage any intraoperative complications.    The patient has been advised of a variety of complications that might develop including but not limited to risks of death, stroke, paravalvular leak, aortic dissection or other major vascular complications, aortic annulus rupture, device embolization, cardiac rupture or perforation, mitral regurgitation, acute myocardial infarction, arrhythmia, heart block or bradycardia requiring permanent pacemaker placement, congestive heart failure, respiratory failure, renal failure, pneumonia, infection, other late complications related to structural valve deterioration or migration, or other complications that might ultimately cause a temporary or permanent loss of functional independence or other long term morbidity. The patient provides full informed consent for the procedure as described and all questions were answered.     Plan:  The patient will tentatively be scheduled for transfemoral transcatheter aortic valve replacement next Tuesday, 05/08/2019.  A decision will need to be made whether the patient can go home until surgery or should remain in the hospital for further medical optimization.   I spent 40 minutes performing this consultation and > 50% of this time was spent face to face counseling and coordinating the care of this patient's severe, symptomatic aortic stenosis.  Gaye Pollack, MD 05/01/2019

## 2019-05-01 NOTE — Care Management Important Message (Signed)
Important Message  Patient Details  Name: [name] MRN: [id number] Date of Birth: Jun 01, 1946   Medicare Important Message Given:  Yes     Shelda Altes 05/01/2019, 11:21 AM

## 2019-05-01 NOTE — Progress Notes (Signed)
Patient incontinent of urine at times and makes it difficult to measure output. Placed external urinary catheter on patient. Will continue to monitor.

## 2019-05-01 NOTE — Progress Notes (Addendum)
Patient ID: [name], male   DOB: 03/14/1946, 73 y.o.   MRN: [id number]     Advanced Heart Failure Rounding Note  PCP-Cardiologist: No primary care provider on file.   Subjective:    Yesterday he was diuresed with IV lasix + metolazone. Brisk diuresis noted.   Feeling better today. Denies SOB.   Objective:   Weight Range: (!) 143.2 kg Body mass index is 42.82 kg/m.   Vital Signs:   Temp:  [97.6 F (36.4 C)-98.1 F (36.7 C)] 98.1 F (36.7 C) (09/15 0620) Pulse Rate:  [62-81] 72 (09/15 0620) Resp:  [18-24] 18 (09/15 0620) BP: (128-158)/(62-87) 128/62 (09/15 0620) SpO2:  [92 %-98 %] 92 % (09/15 0620) Weight:  [143.2 kg] 143.2 kg (09/15 0044) Last BM Date: 04/29/19  Weight change: Filed Weights   04/29/19 0600 04/30/19 0407 05/01/19 0044  Weight: (!) 143.6 kg (!) 143.8 kg (!) 143.2 kg    Intake/Output:   Intake/Output Summary (Last 24 hours) at 05/01/2019 0750 Last data filed at 05/01/2019 0620 Gross per 24 hour  Intake 924 ml  Output 3800 ml  Net -2876 ml      Physical Exam    General:  No resp difficulty. In bed  HEENT: normal Neck: supple. JVP 10-11. Carotids 2+ bilat; no bruits. No lymphadenopathy or thryomegaly appreciated. Cor: PMI nondisplaced. Regular rate & rhythm. No rubs, gallops. 2/6 RUSB. Lungs: clear Abdomen: obese, soft, nontender. No hepatosplenomegaly. No bruits or masses. Good bowel sounds. Extremities: no cyanosis, clubbing, rash, edema Neuro: alert & orientedx3, cranial nerves grossly intact. moves all 4 extremities w/o difficulty. Affect pleasant    Telemetry  NSR 60-70s   Labs    CBC Recent Labs    04/28/19 1011  04/30/19 0713 05/01/19 0542  WBC 10.8*  --  11.1* 10.7*  NEUTROABS 8.2*  --   --   --   HGB 11.4*   < > 11.1* 10.3*  HCT 38.0*   < > 37.3* 34.5*  MCV 83.9  --  83.4 83.1  PLT 190  --  201 193   < > = values in this interval not displayed.   Basic Metabolic Panel Recent Labs    04/30/19 0713  05/01/19 0542  NA 142 139  K 3.4* 3.1*  CL 104 98  CO2 28 30  GLUCOSE 67* 67*  BUN 60* 57*  CREATININE 2.53* 2.39*  CALCIUM 8.8* 8.7*   Liver Function Tests Recent Labs    04/28/19 1011  AST 14*  ALT 23  ALKPHOS 82  BILITOT 0.6  PROT 7.3  ALBUMIN 3.1*   No results for input(s): LIPASE, AMYLASE in the last 72 hours. Cardiac Enzymes No results for input(s): CKTOTAL, CKMB, CKMBINDEX, TROPONINI in the last 72 hours.  BNP: BNP (last 3 results) Recent Labs    08/31/18 1406 04/28/19 1011  BNP 151.0* 140.8*    ProBNP (last 3 results) No results for input(s): PROBNP in the last 8760 hours.   D-Dimer Recent Labs    04/28/19 1819  DDIMER 0.99*   Hemoglobin A1C Recent Labs    04/29/19 0222  HGBA1C 7.9*   Fasting Lipid Panel No results for input(s): CHOL, HDL, LDLCALC, TRIG, CHOLHDL, LDLDIRECT in the last 72 hours. Thyroid Function Tests Recent Labs    04/28/19 1819  TSH 2.939    Other results:   Imaging    No results found.   Medications:     Scheduled Medications:  amLODipine  10 mg Oral Daily  aspirin EC  81 mg Oral Daily   Chlorhexidine Gluconate Cloth  6 each Topical Daily   cholecalciferol  1,000 Units Oral Daily   citalopram  20 mg Oral Daily   famotidine  20 mg Oral Daily   febuxostat  80 mg Oral Daily   gabapentin  300 mg Oral BID   heparin  5,000 Units Subcutaneous Q8H   hydrALAZINE  50 mg Oral TID   insulin aspart  0-20 Units Subcutaneous TID WC   insulin glargine  80 Units Subcutaneous QHS   mouth rinse  15 mL Mouth Rinse BID   metoprolol succinate  50 mg Oral Daily   potassium chloride  40 mEq Oral Q4H   rosuvastatin  20 mg Oral Daily   senna-docusate  2 tablet Oral BID   sodium chloride flush  3 mL Intravenous Q12H    Infusions:  sodium chloride     furosemide 120 mg (05/01/19 0401)    PRN Medications: sodium chloride, acetaminophen, albuterol, antiseptic oral rinse, ondansetron (ZOFRAN) IV,  polyethylene glycol, sodium chloride flush   Assessment/Plan   1. Acute on chronic diastolic CHF with prominent RV failure: Volume difficult to manage in setting of RV failure (OHS/OSA), CKD, and severe AS.  Echo this admission with EF 60-65%, RV reportedly normal, mod-severe aortic stenosis with mean gradient 30 mmHg.  He was admitted with increased dyspnea (at rest).  Creatinine noted to be worse, suspect contrast nephropathy from recent cath as well as cardiorenal.  He has not diuresed well here despite high dose IV Lasix, has been on aggressive home diuretic regimen.   Volume status improving. Continue high dose lasix and one more dose of metolazone 5 mg po x 1 today.  - Anticipate switching back to torsemide 120 mg twice a day + metolazone 3 x a week.  2. Aortic stenosis:  Appeared severe by 1/20 TEE, has looked moderate-severe (mean gradient 30s, AVA around 1.0 cm^2 on subsequent TTEs and RHC/LHC.  He is a very difficult TAVR candidate with OHS/OSA, significant diastolic failure, poor mobility, and CKD.  He has been seen by Dr. Burt Knack and has had cath, however now with AKI.  I agree that main problem now seems to be renal dysfunction with diastolic failure, not sure how much role that aortic stenosis is playing currently.   - Continue discussions with structural heart team.  3. Atrial fibrillation:  He appears to be in sinus with very low voltage Ps (regular rhythm).  He is not anticoagulated (takes ASA) due to prior intracerebral hemorrhage. 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   - As he is not anticoagulated, would continue rate control.  Cannot do DCCV without anticoagulation.  - Continue Toprol XL 50 mg daily.  4. CAD: Nonobstructive on 8/20 cath.  No chest pain.  - Continue ASA and statin.   5. 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  6. AKI on CKD stage III: creatinine 1.97 prior, up to 2.78 at admission.  May  have had contrast nephropathy from recent cath, also suspect cardiorenal syndrome.  Creatinine trending down, 2.53 ->2.39.  - He may end up needing HD for volume management, at baseline he is on very high diuretic doses.  7. OHS/OSA: Continue nightly CPAP and home oxygen.  8. DM Having low glucose the last 3 mornings. Cut back lantus. Suspect he has liberalized diet at home.   Will need to PACE when he is discharged.  Consult PT/ OOB  Length of Stay: 3  [name], NP  05/01/2019, 7:50 AM  Advanced Heart Failure Team Pager [phone] (M-F; [place])  Please contact [facility] for night-coverage after hours (4p -7a ) and weekends on [email or web address]  Patient seen with NP, agree with the above note.    He diuresed well yesterday, weight coming down.  Creatinine lower at 2.39.   General: NAD Neck: Thick, JVP difficult but appears elevated at 10-11, no thyromegaly or thyroid nodule.  Lungs: Decreased BS at bases.  CV: Nonpalpable PMI.  Heart regular S1/S2, no S3/S4, 2/6 SEM RUSB with muffled S2.  1+ edema to knees with lower legs wrapped.   Abdomen: Soft, nontender, no hepatosplenomegaly, no distention.  Skin: Intact without lesions or rashes.  Neurologic: Alert and oriented x 3.  Psych: Normal affect. Extremities: No clubbing or cyanosis.  HEENT: Normal.   Creatinine improving with diuresis.  Exam difficult with body habitus but think he still has some volume overload.  Continue high dose IV Lasix and will give metolazone 5 mg po x 1. Probably back to po meds tomorrow.   Glucose low in mornings.  Patient apparently eats very poorly at home.  Will need to cut back on insulin today while hospitalized.   Severe AS, marginal candidate for TAVR but for now plan to proceed.  Ongoing workup with structural heart team after discharge.  Loralie Champagne 05/01/2019 8:21 AM

## 2019-05-02 ENCOUNTER — Other Ambulatory Visit: Payer: Self-pay

## 2019-05-02 ENCOUNTER — Inpatient Hospital Stay (HOSPITAL_COMMUNITY): Payer: Medicare (Managed Care)

## 2019-05-02 ENCOUNTER — Encounter: Payer: Medicare (Managed Care) | Admitting: Surgery

## 2019-05-02 DIAGNOSIS — I350 Nonrheumatic aortic (valve) stenosis: Secondary | ICD-10-CM

## 2019-05-02 LAB — CBC
HCT: 37.2 % — ABNORMAL LOW (ref 39.0–52.0)
Hemoglobin: 11.5 g/dL — ABNORMAL LOW (ref 13.0–17.0)
MCH: 25.5 pg — ABNORMAL LOW (ref 26.0–34.0)
MCHC: 30.9 g/dL (ref 30.0–36.0)
MCV: 82.5 fL (ref 80.0–100.0)
Platelets: 200 10*3/uL (ref 150–400)
RBC: 4.51 MIL/uL (ref 4.22–5.81)
RDW: 15.4 % (ref 11.5–15.5)
WBC: 11.4 10*3/uL — ABNORMAL HIGH (ref 4.0–10.5)
nRBC: 0 % (ref 0.0–0.2)

## 2019-05-02 LAB — GLUCOSE, CAPILLARY
Glucose-Capillary: 79 mg/dL (ref 70–99)
Glucose-Capillary: 162 mg/dL — ABNORMAL HIGH (ref 70–99)
Glucose-Capillary: 169 mg/dL — ABNORMAL HIGH (ref 70–99)
Glucose-Capillary: 206 mg/dL — ABNORMAL HIGH (ref 70–99)

## 2019-05-02 LAB — MAGNESIUM: Magnesium: 2.2 mg/dL (ref 1.7–2.4)

## 2019-05-02 LAB — BASIC METABOLIC PANEL
Anion gap: 13 (ref 5–15)
BUN: 60 mg/dL — ABNORMAL HIGH (ref 8–23)
CO2: 29 mmol/L (ref 22–32)
Calcium: 8.9 mg/dL (ref 8.9–10.3)
Chloride: 95 mmol/L — ABNORMAL LOW (ref 98–111)
Creatinine, Ser: 2.63 mg/dL — ABNORMAL HIGH (ref 0.61–1.24)
GFR calc Af Amer: 27 mL/min — ABNORMAL LOW (ref >60)
GFR calc non Af Amer: 23 mL/min — ABNORMAL LOW (ref >60)
Glucose, Bld: 152 mg/dL — ABNORMAL HIGH (ref 70–99)
Potassium: 3.9 mmol/L (ref 3.5–5.1)
Sodium: 137 mmol/L (ref 135–145)

## 2019-05-02 LAB — ABO/RH: ABO/RH(D): B POS

## 2019-05-02 LAB — TYPE AND SCREEN
ABO/RH(D): B POS
Antibody Screen: NEGATIVE

## 2019-05-02 MED ORDER — TORSEMIDE 20 MG PO TABS: 120.0000 mg | ORAL_TABLET | Freq: Every day | ORAL | Status: DC

## 2019-05-02 MED ORDER — TORSEMIDE 20 MG PO TABS
120.0000 mg | ORAL_TABLET | Freq: Two times a day (BID) | ORAL | Status: DC
  Administered 2019-05-02 – 2019-05-08 (×13): 120 mg via ORAL
  Filled 2019-05-02 (×13): qty 6

## 2019-05-02 MED ORDER — TORSEMIDE 20 MG PO TABS: 20.0000 mg | ORAL_TABLET | Freq: Every day | ORAL | Status: DC

## 2019-05-02 NOTE — Progress Notes (Signed)
Occupational Therapy Evaluation Patient Details Name: [name] MRN: [id number] DOB: 25-Jan-1946 Today's Date: 05/02/2019    History of Present Illness 73 year old gentleman with chronic kidney disease baseline creatinine ~2.5), morbid obesity, RV dysfunction, L metatarsal amputation and blindness. On 2L O2 at baseline. Pt came to ED with c/o of SoB. Admitted 04/28/19 for treatment of acute on chronic CHF, AKI, AS, HTN, CAD and paroxysmal A fib.    Clinical Impression   PTA, pt was living at home alone in a single story apartment with ramped entrance. Pt reports he received assistance from a HHaide (2hr in the morning 3 hrs in the evenint) to assist with ADL/IADL and pt reports he was independent with household ambulation with a cane/RW. Pt reports he does not feel this is enough assistance at home. Pt currently requires minA for all aspects of ADL and minA-minguard with functional mobiltiy at RW level. Pt on 3lnc throughout session, SpO2 88%-90% at rest, dropped to 84% with functional mobility requiring 2 min to return to 88%. Due to decline in current level of function, pt would benefit from acute OT to address established goals to facilitate safe D/C to venue listed below. At this time, recommend HHOT through PACE follow-up. Will continue to follow acutely.     Follow Up Recommendations  Home health OT    Equipment Recommendations  None recommended by OT    Recommendations for Other Services       Precautions / Restrictions Precautions Precautions: Fall Precaution Comments: hx of fall Restrictions Weight Bearing Restrictions: No      Mobility Bed Mobility               General bed mobility comments: sitting in recliner upon arrival  Transfers Overall transfer level: Needs assistance Equipment used: Rolling walker (2 wheeled) Transfers: Sit to/from [facility] Sit to Stand: Min assist Stand pivot transfers: Min guard;Min assist       General  transfer comment: minA for power up from recliner    Balance Overall balance assessment: Needs assistance Sitting-balance support: Feet supported;No upper extremity supported Sitting balance-Leahy Scale: Fair     Standing balance support: Bilateral upper extremity supported Standing balance-Leahy Scale: Poor Standing balance comment: requires UE support                           ADL either performed or assessed with clinical judgement   ADL Overall ADL's : At baseline                                       General ADL Comments: pt appears at baseline level for ADL completion, pt requires min assistance in all aspects of ADL;minguard-minA for functional mobiltiy with vc for navigating unfamiliar environment     Vision Baseline Vision/History: Legally blind       Perception     Praxis      Pertinent Vitals/Pain Pain Assessment: No/denies pain     Hand Dominance Right   Extremity/Trunk Assessment Upper Extremity Assessment Upper Extremity Assessment: Generalized weakness   Lower Extremity Assessment Lower Extremity Assessment: Defer to PT evaluation LLE Deficits / Details: L metatarsal amputation,   Cervical / Trunk Assessment Cervical / Trunk Assessment: Normal   Communication Communication Communication: No difficulties   Cognition Arousal/Alertness: Awake/alert Behavior During Therapy: WFL for tasks assessed/performed Overall Cognitive Status: Within Functional Limits for tasks  assessed                                     General Comments  SpO2 88% on 3lnc dropped to 84% during functional mobility, 46min pursed lip breathing to return SpO2 to 88%-90%    Exercises     Shoulder Instructions      Home Living Family/patient expects to be discharged to:: Private residence Living Arrangements: Alone Available Help at Discharge: Family;Personal care attendant;Available PRN/intermittently Type of Home: Apartment Home  Access: Ramped entrance     Home Layout: One level     Bathroom Shower/Tub: Teacher, early years/pre: Standard     Home Equipment: Environmental consultant - 4 wheels;Bedside commode;Shower seat;Grab bars - tub/shower;Cane - single point   Additional Comments: PCA daily for several hours, PACE 2x/week      Prior Functioning/Environment Level of Independence: Needs assistance  Gait / Transfers Assistance Needed: ambulates at home with cane or RW ADL's / Homemaking Assistance Needed: PCA (2 hrs am and 3 hrs pm) to assist with ADLS, IADLS, daughter does shopping, receives meals on wheels   Comments: patient blind        OT Problem List: Decreased activity tolerance;Impaired balance (sitting and/or standing);Impaired vision/perception;Decreased safety awareness;Cardiopulmonary status limiting activity      OT Treatment/Interventions: Self-care/ADL training;Therapeutic exercise;Energy conservation;DME and/or AE instruction;Therapeutic activities;Patient/family education;Balance training;Visual/perceptual remediation/compensation    OT Goals(Current goals can be found in the care plan section) Acute Rehab OT Goals Patient Stated Goal: go home OT Goal Formulation: With patient Time For Goal Achievement: 05/16/19 Potential to Achieve Goals: Good ADL Goals Pt Will Perform Grooming: with supervision Pt Will Perform Upper Body Dressing: with set-up Pt Will Perform Lower Body Dressing: with supervision;with adaptive equipment Pt Will Transfer to Toilet: with supervision;ambulating Additional ADL Goal #1: Pt will demonstrate independence with 3 energy conservation strategies during ADL completion.  OT Frequency: Min 2X/week   Barriers to D/C: Decreased caregiver support  pt lives alone       Co-evaluation              AM-PAC OT "6 Clicks" Daily Activity     Outcome Measure Help from another person eating meals?: A Little Help from another person taking care of personal grooming?:  A Little Help from another person toileting, which includes using toliet, bedpan, or urinal?: A Little Help from another person bathing (including washing, rinsing, drying)?: A Little Help from another person to put on and taking off regular upper body clothing?: A Little Help from another person to put on and taking off regular lower body clothing?: A Little 6 Click Score: 18   End of Session Equipment Utilized During Treatment: Gait belt;Rolling walker;Oxygen Nurse Communication: Mobility status  Activity Tolerance: Patient tolerated treatment well Patient left: in chair;with call bell/phone within reach  OT Visit Diagnosis: Other abnormalities of gait and mobility (R26.89);Muscle weakness (generalized) (M62.81);Low vision, both eyes (H54.2)                Time: 2671-2458 OT Time Calculation (min): 20 min Charges:  OT General Charges $OT Visit: 1 Visit OT Evaluation $OT Eval Moderate Complexity: [name] OTR/L Acute Rehabilitation Services Office: [place] 05/02/2019, 12:11 PM

## 2019-05-02 NOTE — Progress Notes (Signed)
CARDIAC REHAB PHASE I   PRE:  Rate/Rhythm: 67 pacing    BP: sitting 126/68    SaO2: 87-89 3L  MODE:  Ambulation: 130 ft   POST:  Rate/Rhythm: 80 pacing    BP: sitting 165/83     SaO2: 91 6L, 87 3L  Pt stiff and hard to stand. Used rollator with gait belt, 6L O2, assist x2. Fatigue with distance and wanted to sit and rest. SaO2 lower once he sits, presumably due to abdomen interference. SaO2 85 6L sitting in hall but had been low 90s while walking. Sat in hall 108min then returned to room.  To recliner. Declined elevating feet as it hurts to have them up.  [place], ACSM 05/02/2019 3:38 PM

## 2019-05-02 NOTE — Progress Notes (Addendum)
Patient ID: [name], male   DOB: [birth year], 73 y.o.   MRN: [id number]     Advanced Heart Failure Rounding Note  PCP-Cardiologist: No primary care provider on file.   Subjective:    Yesterday he was diuresed with IV lasix + metolazone. Weight down 8 pounds.   Creatinine/BUN trending up.    Denies SOB   Objective:   Weight Range: (!) 139.3 kg Body mass index is 41.65 kg/m.   Vital Signs:   Temp:  [98 F (36.7 C)-98.8 F (37.1 C)] 98.8 F (37.1 C) (09/16 0351) Pulse Rate:  [58-71] 58 (09/16 0351) Resp:  [15-25] 21 (09/16 0351) BP: (131-155)/(68-80) 140/71 (09/16 0351) SpO2:  [89 %-98 %] 97 % (09/16 0351) Weight:  [139.3 kg] 139.3 kg (09/16 0351) Last BM Date: 04/29/19  Weight change: Filed Weights   04/30/19 0407 05/01/19 0044 05/02/19 0351  Weight: (!) 143.8 kg (!) 143.2 kg (!) 139.3 kg    Intake/Output:   Intake/Output Summary (Last 24 hours) at 05/02/2019 0740 Last data filed at 05/02/2019 0417 Gross per 24 hour  Intake 942 ml  Output 2700 ml  Net -1758 ml      Physical Exam   General:   No resp difficulty HEENT: normal Neck: supple. JVP difficult but appears 6-7 range. Carotids 2+ bilat; no bruits. No lymphadenopathy or thryomegaly appreciated. Cor: PMI nondisplaced. Regular rate & rhythm. No rubs, gallops. RUSB 2/6 . Lungs: clear Abdomen: soft, nontender, nondistended. No hepatosplenomegaly. No bruits or masses. Good bowel sounds. Extremities: no cyanosis, clubbing, rash, edema Neuro: alert & orientedx3, cranial nerves grossly intact. moves all 4 extremities w/o difficulty. Affect pleasant  Telemetry  NSR 60-70s   Labs    CBC Recent Labs    05/01/19 0542 05/02/19 0321  WBC 10.7* 11.4*  HGB 10.3* 11.5*  HCT 34.5* 37.2*  MCV 83.1 82.5  PLT 193 629   Basic Metabolic Panel Recent Labs    05/01/19 0542 05/02/19 0321  NA 139 137  K 3.1* 3.9  CL 98 95*  CO2 30 29  GLUCOSE 67* 152*  BUN 57* 60*  CREATININE 2.39* 2.63*  CALCIUM  8.7* 8.9  MG  --  2.2   Liver Function Tests No results for input(s): AST, ALT, ALKPHOS, BILITOT, PROT, ALBUMIN in the last 72 hours. No results for input(s): LIPASE, AMYLASE in the last 72 hours. Cardiac Enzymes No results for input(s): CKTOTAL, CKMB, CKMBINDEX, TROPONINI in the last 72 hours.  BNP: BNP (last 3 results) Recent Labs    08/31/18 1406 04/28/19 1011  BNP 151.0* 140.8*    ProBNP (last 3 results) No results for input(s): PROBNP in the last 8760 hours.   D-Dimer No results for input(s): DDIMER in the last 72 hours. Hemoglobin A1C No results for input(s): HGBA1C in the last 72 hours. Fasting Lipid Panel No results for input(s): CHOL, HDL, LDLCALC, TRIG, CHOLHDL, LDLDIRECT in the last 72 hours. Thyroid Function Tests No results for input(s): TSH, T4TOTAL, T3FREE, THYROIDAB in the last 72 hours.  Invalid input(s): FREET3  Other results:   Imaging    No results found.   Medications:     Scheduled Medications: . amLODipine  10 mg Oral Daily  . aspirin EC  81 mg Oral Daily  . Chlorhexidine Gluconate Cloth  6 each Topical Daily  . cholecalciferol  1,000 Units Oral Daily  . citalopram  20 mg Oral Daily  . famotidine  20 mg Oral Daily  . febuxostat  80 mg Oral Daily  .  gabapentin  300 mg Oral BID  . heparin  5,000 Units Subcutaneous Q8H  . hydrALAZINE  50 mg Oral TID  . insulin aspart  0-20 Units Subcutaneous TID WC  . insulin glargine  70 Units Subcutaneous QHS  . mouth rinse  15 mL Mouth Rinse BID  . metoprolol succinate  50 mg Oral Daily  . rosuvastatin  20 mg Oral Daily  . senna-docusate  2 tablet Oral BID  . sodium chloride flush  3 mL Intravenous Q12H    Infusions: . sodium chloride    . furosemide 120 mg (05/02/19 0447)    PRN Medications: sodium chloride, acetaminophen, albuterol, antiseptic oral rinse, ondansetron (ZOFRAN) IV, polyethylene glycol, sodium chloride flush   Assessment/Plan   1. Acute on chronic diastolic CHF with  prominent RV failure: Volume difficult to manage in setting of RV failure (OHS/OSA), CKD, and severe AS.  Echo this admission with EF 60-65%, RV reportedly normal, mod-severe aortic stenosis with mean gradient 30 mmHg.  He was admitted with increased dyspnea (at rest).  Creatinine noted to be worse, suspect contrast nephropathy from recent cath as well as cardiorenal.  He has not diuresed well here despite high dose IV Lasix, has been on aggressive home diuretic regimen.   Volume status stable.  - Stop high dose lasix and restart torsemide 120 mg twice a day.  2. Aortic stenosis:  Appeared severe by 1/20 TEE, has looked moderate-severe (mean gradient 30s, AVA around 1.0 cm^2 on subsequent TTEs and RHC/LHC.  He is a very difficult TAVR candidate with OHS/OSA, significant diastolic failure, poor mobility, and CKD.  He has been seen by Dr. Burt Knack and has had cath, however now with AKI.  Dr Cyndia Bent consulted and he is scheduled for TAVR on 9/22 3. Atrial fibrillation:  He appears to be in sinus with very low voltage Ps (regular rhythm).  He is not anticoagulated (takes ASA) due to prior intracerebral hemorrhage. 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   - As he is not anticoagulated, would continue rate control.  Cannot do DCCV without anticoagulation.  - Continue Toprol XL 50 mg daily.  4. CAD: Nonobstructive on 8/20 cath.  No chest pain. - Continue ASA and statin.   5. 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  6. AKI on CKD stage III: creatinine 1.97 prior, up to 2.78 at admission.  May have had contrast nephropathy from recent cath, also suspect cardiorenal syndrome.  Creatinine 2.53 ->2.39->2.6.  - He may end up needing HD for volume management eventually, at baseline he is on very high diuretic doses.  7. OHS/OSA: Continue nightly CPAP and home oxygen.  8. DM Blood glucose improved with adjustment to insulin.  Continue mobilize.    Length of Stay: 4  Ryan Clegg, NP  05/02/2019, 7:40 AM  Advanced Heart Failure Team Pager [phone] (M-F; 7a - 4p)  Please contact [facility] for night-coverage after hours (4p -7a ) and weekends on [email or web address]  Patient seen with NP, agree with the above note   He diuresed well again yesterday, weight down.  Creatinine up to 2.65.  No dyspnea at rest.   General: NAD Neck: JVP difficult but probably not elevated, no thyromegaly or thyroid nodule.  Lungs: Clear to auscultation bilaterally with normal respiratory effort. CV: Nondisplaced PMI.  Heart regular S1/S2, no S3/S4, 2/6 SEM RUSB with muffled S2.  1+ edema to knees.   Abdomen: Soft, nontender, no hepatosplenomegaly, no  distention.  Skin: Intact without lesions or rashes.  Neurologic: Alert and oriented x 3.  Psych: Normal affect. Extremities: No clubbing or cyanosis.  HEENT: Normal.   Volume status looks better today, creatinine up to 2.65.  - Stop IV Lasix, restart torsemide 120 mg bid.  - He is on a very aggressive diuretic regimen at home in the setting of cardiorenal syndrome.  Unless TAVR leads to significant improvement in CHF, he may end up needing dialysis in the future for volume management.   Severe AS, difficult candidate for TAVR but for now plan to proceed.  Dr. Cyndia Bent saw him yesterday, TAVR planned for 9/22.  He has had all his scans.   - I think it would be safest to keep him in the hospital until TAVR to make sure volume and creatinine remain stable, but he is pretty insistent on going home.  If he goes home, will need COVID testing prior to TAVR.  If he goes home, will go tomorrow.   Loralie Champagne 05/02/2019 8:37 AM

## 2019-05-03 ENCOUNTER — Other Ambulatory Visit: Payer: Self-pay | Admitting: Physician Assistant

## 2019-05-03 LAB — CBC
HCT: 35.4 % — ABNORMAL LOW (ref 39.0–52.0)
Hemoglobin: 10.5 g/dL — ABNORMAL LOW (ref 13.0–17.0)
MCH: 24.5 pg — ABNORMAL LOW (ref 26.0–34.0)
MCHC: 29.7 g/dL — ABNORMAL LOW (ref 30.0–36.0)
MCV: 82.7 fL (ref 80.0–100.0)
Platelets: 198 10*3/uL (ref 150–400)
RBC: 4.28 MIL/uL (ref 4.22–5.81)
RDW: 15.5 % (ref 11.5–15.5)
WBC: 12.5 10*3/uL — ABNORMAL HIGH (ref 4.0–10.5)
nRBC: 0 % (ref 0.0–0.2)

## 2019-05-03 LAB — BASIC METABOLIC PANEL
Anion gap: 11 (ref 5–15)
BUN: 59 mg/dL — ABNORMAL HIGH (ref 8–23)
CO2: 32 mmol/L (ref 22–32)
Calcium: 8.6 mg/dL — ABNORMAL LOW (ref 8.9–10.3)
Chloride: 93 mmol/L — ABNORMAL LOW (ref 98–111)
Creatinine, Ser: 2.57 mg/dL — ABNORMAL HIGH (ref 0.61–1.24)
GFR calc Af Amer: 28 mL/min — ABNORMAL LOW (ref >60)
GFR calc non Af Amer: 24 mL/min — ABNORMAL LOW (ref >60)
Glucose, Bld: 114 mg/dL — ABNORMAL HIGH (ref 70–99)
Potassium: 3.4 mmol/L — ABNORMAL LOW (ref 3.5–5.1)
Sodium: 136 mmol/L (ref 135–145)

## 2019-05-03 LAB — GLUCOSE, CAPILLARY
Glucose-Capillary: 89 mg/dL (ref 70–99)
Glucose-Capillary: 192 mg/dL — ABNORMAL HIGH (ref 70–99)
Glucose-Capillary: 161 mg/dL — ABNORMAL HIGH (ref 70–99)
Glucose-Capillary: 175 mg/dL — ABNORMAL HIGH (ref 70–99)

## 2019-05-03 MED ORDER — HYDRALAZINE HCL 50 MG PO TABS
75.0000 mg | ORAL_TABLET | Freq: Three times a day (TID) | ORAL | Status: DC
  Administered 2019-05-03 – 2019-05-08 (×16): 75 mg via ORAL
  Filled 2019-05-03 (×16): qty 1

## 2019-05-03 MED ORDER — POTASSIUM CHLORIDE CRYS ER 20 MEQ PO TBCR
40.0000 meq | EXTENDED_RELEASE_TABLET | Freq: Once | ORAL | Status: AC
  Administered 2019-05-03: 14:00:00 40 meq via ORAL
  Filled 2019-05-03: qty 2

## 2019-05-03 MED ORDER — CLOPIDOGREL BISULFATE 75 MG PO TABS
75.0000 mg | ORAL_TABLET | Freq: Every day | ORAL | Status: DC
  Administered 2019-05-03 – 2019-05-11 (×9): 75 mg via ORAL
  Filled 2019-05-03 (×9): qty 1

## 2019-05-03 NOTE — Progress Notes (Signed)
Structural Heart Note: The patient was re-evaluated last night. He was sitting up in the chair at the bedside. I have reviewed Dr Claris Gladden notes and he favors keeping the patient in the hospital until TAVR because of his tenuous renal function and heart failure management. The patient has discussed this with his family and he is agreeable to remain an inpatient. I have again reviewed our plans for TAVR via transfemoral access with limited contrast to reduce his risk of contrast-induced nephropathy. Will start him on plavix 75 mg daily. Will follow-up as we approach TAVR next week.   [name] 05/03/2019 6:46 AM

## 2019-05-03 NOTE — Progress Notes (Signed)
Pt was desating as low as 79%. Pt was not on CPAP or oxygen and sleeping. Placed back on CPAP and oxygen set to 3 L. Pt con't to stay around 85%. Increased oxygen to 4L. Sats maintaining 90-91%. Cont to monitor. Carroll Kinds RN

## 2019-05-03 NOTE — Progress Notes (Addendum)
Patient ID: [name], male   DOB: May 24, 1946, 73 y.o.   MRN: [id number]     Advanced Heart Failure Rounding Note  PCP-Cardiologist: No primary care provider on file.   Subjective:    Weight up 7 pounds but negative 2.6 liters => think he is being weighed in bed.  Yesterday he was switched to torsemide.   He is agreeable to staying in the hospital for surgery. Denies SOB.   Objective:   Weight Range: (!) 142 kg Body mass index is 42.45 kg/m.   Vital Signs:   Temp:  [98.1 F (36.7 C)-98.5 F (36.9 C)] 98.1 F (36.7 C) (09/17 0523) Pulse Rate:  [59-62] 61 (09/17 0523) Resp:  [13-24] 24 (09/17 0523) BP: (131-152)/(59-77) 149/76 (09/17 0523) SpO2:  [91 %-98 %] 97 % (09/17 0523) Weight:  [142 kg] 142 kg (09/17 0523) Last BM Date: 05/02/19  Weight change: Filed Weights   05/01/19 0044 05/02/19 0351 05/03/19 0523  Weight: (!) 143.2 kg (!) 139.3 kg (!) 142 kg    Intake/Output:   Intake/Output Summary (Last 24 hours) at 05/03/2019 0758 Last data filed at 05/03/2019 0534 Gross per 24 hour  Intake 786 ml  Output 3450 ml  Net -2664 ml      Physical Exam   General:  In bed. No resp difficulty HEENT: normal Neck: supple. Difficult to assess.  Carotids 2+ bilat; no bruits. No lymphadenopathy or thryomegaly appreciated. Cor: PMI nondisplaced. Regular rate & rhythm. No rubs, gallops. RUSB 2/6 . Lungs: clear Abdomen: obese, soft, nontender, nondistended. No hepatosplenomegaly. No bruits or masses. Good bowel sounds. Extremities: no cyanosis, clubbing, rash, R and LLE unnal boots.  Neuro: alert & orientedx3, cranial nerves grossly intact. moves all 4 extremities w/o difficulty. Affect pleasant   Telemetry  NSR 60-70s   Labs    CBC Recent Labs    05/02/19 0321 05/03/19 0403  WBC 11.4* 12.5*  HGB 11.5* 10.5*  HCT 37.2* 35.4*  MCV 82.5 82.7  PLT 200 979   Basic Metabolic Panel Recent Labs    05/02/19 0321 05/03/19 0403  NA 137 136  K 3.9 3.4*  CL 95* 93*   CO2 29 32  GLUCOSE 152* 114*  BUN 60* 59*  CREATININE 2.63* 2.57*  CALCIUM 8.9 8.6*  MG 2.2  --    Liver Function Tests No results for input(s): AST, ALT, ALKPHOS, BILITOT, PROT, ALBUMIN in the last 72 hours. No results for input(s): LIPASE, AMYLASE in the last 72 hours. Cardiac Enzymes No results for input(s): CKTOTAL, CKMB, CKMBINDEX, TROPONINI in the last 72 hours.  BNP: BNP (last 3 results) Recent Labs    08/31/18 1406 04/28/19 1011  BNP 151.0* 140.8*    ProBNP (last 3 results) No results for input(s): PROBNP in the last 8760 hours.   D-Dimer No results for input(s): DDIMER in the last 72 hours. Hemoglobin A1C No results for input(s): HGBA1C in the last 72 hours. Fasting Lipid Panel No results for input(s): CHOL, HDL, LDLCALC, TRIG, CHOLHDL, LDLDIRECT in the last 72 hours. Thyroid Function Tests No results for input(s): TSH, T4TOTAL, T3FREE, THYROIDAB in the last 72 hours.  Invalid input(s): FREET3  Other results:   Imaging    Dg Chest 2 View  Result Date: 05/02/2019 CLINICAL DATA:  Severe aortic stenosis EXAM: CHEST - 2 VIEW COMPARISON:  04/28/2019 FINDINGS: RIGHT-sided transvenous pacemaker with leads to the RIGHT atrium and RIGHT ventricle. The heart is enlarged and stable in configuration. There is pulmonary vascular congestion without overt  edema. There is volume loss and increased atelectasis at the LEFT lung base. Early infiltrate would be difficult to exclude. IMPRESSION: 1. Stable cardiomegaly.  Pulmonary vascular congestion. 2. Atelectasis or early infiltrate at the LEFT lung base. Electronically Signed   By: Nolon Nations M.D.   On: 05/02/2019 20:00     Medications:     Scheduled Medications: . amLODipine  10 mg Oral Daily  . aspirin EC  81 mg Oral Daily  . Chlorhexidine Gluconate Cloth  6 each Topical Daily  . cholecalciferol  1,000 Units Oral Daily  . citalopram  20 mg Oral Daily  . clopidogrel  75 mg Oral Daily  . famotidine  20 mg  Oral Daily  . febuxostat  80 mg Oral Daily  . gabapentin  300 mg Oral BID  . heparin  5,000 Units Subcutaneous Q8H  . hydrALAZINE  50 mg Oral TID  . insulin aspart  0-20 Units Subcutaneous TID WC  . insulin glargine  70 Units Subcutaneous QHS  . mouth rinse  15 mL Mouth Rinse BID  . metoprolol succinate  50 mg Oral Daily  . rosuvastatin  20 mg Oral Daily  . senna-docusate  2 tablet Oral BID  . sodium chloride flush  3 mL Intravenous Q12H  . torsemide  120 mg Oral BID    Infusions: . sodium chloride      PRN Medications: sodium chloride, acetaminophen, albuterol, antiseptic oral rinse, ondansetron (ZOFRAN) IV, polyethylene glycol, sodium chloride flush   Assessment/Plan   1. Acute on chronic diastolic CHF with prominent RV failure: Volume difficult to manage in setting of RV failure (OHS/OSA), CKD, and severe AS.  Echo this admission with EF 60-65%, RV reportedly normal, mod-severe aortic stenosis with mean gradient 30 mmHg.  He was admitted with increased dyspnea (at rest).  Creatinine noted to be worse, suspect contrast Nephropathy from recent cath as well as cardiorenal.  Weight up 7 pounds? But negative 2.6 liters. Continue torsemide 120 mg twice a day.  2. Aortic stenosis:  Suspect this has progressed to severe AS based on the totality of recent studies.  He is a very difficult TAVR candidate with OHS/OSA, significant diastolic failure, poor mobility, and CKD.  He has been seen by Dr. Burt Knack and has had cath, however now with AKI.  Dr Cyndia Bent consulted and he is scheduled for TAVR on 9/22 3. Atrial fibrillation:  He appears to be in sinus with very low voltage Ps (regular rhythm).  He is not anticoagulated (takes ASA) due to prior intracerebral hemorrhage. 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   - As he is not anticoagulated, would continue rate control.  Cannot do DCCV without anticoagulation.  - Continue Toprol XL 50 mg daily.  4. CAD:  Nonobstructive on 8/20 cath.  No chest pain. - Continue ASA and statin.   5. 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  6. AKI on CKD stage III: creatinine 1.97 prior, up to 2.78 at admission.  May have had contrast nephropathy from recent cath, also suspect cardiorenal syndrome.  Creatinine 2.53 ->2.39->2.63->2.57   - He may end up needing HD for volume management eventually, at baseline he is on very high diuretic doses.  7. OHS/OSA: Continue nightly CPAP and home oxygen.  8. DM Blood glucose improved with adjustment to insulin.  Continue mobilize. Plan to keep in the hospital until after surgery.   Length of Stay: [place], NP  05/03/2019, 7:58 AM  Advanced Heart Failure Team Pager [phone] (M-F; 7a - 4p)  Please contact [facility] for night-coverage after hours (4p -7a ) and weekends on [email or web address]  Patient seen with NP, agree with the above note   He diuresed well again yesterday, weight up but suspect not accurate as he is being weighed in the bed.  Creatinine trending down, 2.57.  No dyspnea at rest.   General: NAD Neck: Thick, no JVD, no thyromegaly or thyroid nodule.  Lungs: Clear to auscultation bilaterally with normal respiratory effort. CV: Nondisplaced PMI.  Heart regular S1/S2, no S3/S4, 2/6 SEM RUSB with muffled S2. Trace ankle edema.   Abdomen: Soft, nontender, no hepatosplenomegaly, no distention.  Skin: Intact without lesions or rashes.  Neurologic: Alert and oriented x 3.  Psych: Normal affect. Extremities: No clubbing or cyanosis.  HEENT: Normal.   I think that his volume status looks ok, creatinine down to 2.57. Weight not accurate.  - Continue torsemide 120 mg bid. May need metolazone dose tomorrow, apparently taking every other day at home (he is not sure about his meds, gets in bubble pack).  - He is on a very aggressive diuretic regimen at home in the setting of cardiorenal syndrome.  Unless TAVR leads to significant  improvement in CHF, he may end up needing dialysis in the future for volume management.   Severe AS, difficult candidate for TAVR but for now plan to proceed.  Dr. Cyndia Bent saw him, TAVR planned for 9/22.  He has had all his scans.  - I think it would be safest to keep him in the hospital until TAVR to make sure volume and creatinine remain stable. He is agreeable to this. - Plavix started pre-TAVR.   BP running high, increase hydralazine to 75 mg tid.   Mobilize with PT/cardiac rehab.   Loralie Champagne 05/03/2019 8:17 AM

## 2019-05-03 NOTE — Progress Notes (Signed)
05/03/2019 PT TAVR Pre-Assessment  HPI: 73 year old gentleman with chronic kidney disease baseline creatinine ~2.5), morbid obesity, RV dysfunction, L metatarsal amputation and blindness. On 2L O2 at baseline. Pt came to ED with c/o of SoB. Admitted 04/28/19 for treatment of acute on chronic CHF, AKI, AS, HTN, CAD and paroxysmal A fib.   Clinical Impression Statement: Pt is a 73 y.o.male being assessed for pre-TAVR.  Pt reports symptoms of dyspnea and fatigue with activity.  Pt has 3+/5strength, WFL ROM, and decreased balance.  Pt ambulated 148 ft during the 6 minute walk test requiring 1 rest breaks with max HR of 102 bpm, lowest O2 sat 88%O2 on 2L supplemental O2 via [place], BP 137/69 during mobility.  5 meter walk test produced an average gait speed of 0.72 ft/sec which indicates high fall risk.  RPE was 10/10 and dyspnea was 10/10 during mobility.  Pt was limited by dyspnea and decreased endurane  Pt's frailty rating was 6 which is considered moderately frail.  Pt would benefit from continued PT in the acute care setting due to the above listed deficits in balance, strength, ROM, endurance and activity tolerance.    General UE/LE Strength and ROM:  Strength (0-5/5) ROM (limited/full)  R UE Grossly 3+/5 WFL  L UE Grossly 3+/5 WFL  R LE Grossly 3+/5 Decreased knee and hip ROM due to body habitus, WFL  L LE Grossly 3+/5 Decreased knee and hip ROM due to body habitus, WFL    6 Minute Walk Test:   Total Distance Walked:148 ft.    Did the pt need a rest break? Yes If yes, why? Pain:No; Fatigue:Yes; Dyspnea/O2 saturations: Yes Comments: Pt with limited reserve and only able to ambulate 148 feet, unable to recover in 6 minute period to continue ambulation     Pre-Test Post-Test  BP 133/52 137/69  HR 67 76  O2 saturations (indicated RA or L/min [place]) 92% on 2L via [place] 93% on 2L via [name]  Modified Borg Dyspnea Scale (0 none-10 maximal) 2 10  RPE (6 very light-10 very hard) 6 10  Comments: Post-Test  measurements taken after 6 minute duration. maxHR 102bpm, SaO2 dropped to 88%O2 on 2L via [place]  5 Meter Walk Test:  Trial 1 21.2 seconds  Trial 2 n/a seconds  Trial 3 na seconds  3 Trial Average/Gait Speed 21.2 seconds/0.72 ft/sec (<1.8 ft/sec indicates high fall risk)  Comments: Unable to complete separate 5 Meter walk test, data collect at beginning of 6 minute walk test  Clinical Frailty Scale (1 very fit - 9 terminally ill): 6 (</= 5/12 is considered frail)   Benjamine Mola B. Migdalia Dk PT, DPT Acute Rehabilitation Services Pager [phone] Office [phone]

## 2019-05-03 NOTE — Progress Notes (Signed)
  HEART AND [place] Cardiomyopathy Questionnaire  KCCQ-12 05/03/2019  1 a. Ability to shower/bathe Quite a bit limited  1 b. Ability to walk 1 block Other, Did not do  1 c. Ability to hurry/jog Other, Did not do  2. Edema feet/ankles/legs Every morning  3. Limited by fatigue All of the time  4. Limited by dyspnea All of the time  5. Sitting up / on 3+ pillows Every night  6. Limited enjoyment of life Extremely limited  7. Rest of life w/ symptoms Not at all satisfied  8 a. Participation in hobbies Severely limited  8 b. Participation in chores Severely limited  8 c. Visiting family/friends N/A, did not do for other reasons     Angelena Form PA-C  [facility]

## 2019-05-03 NOTE — Progress Notes (Signed)
Physical Therapy Treatment Patient Details Name: [name] MRN: [id number] DOB: 12/30/1945 Today's Date: 05/03/2019    History of Present Illness 73 year old gentleman with chronic kidney disease baseline creatinine ~2.5), morbid obesity, RV dysfunction, L metatarsal amputation and blindness. On 2L O2 at baseline. Pt came to ED with c/o of SoB. Admitted 04/28/19 for treatment of acute on chronic CHF, AKI, AS, HTN, CAD and paroxysmal A fib.     PT Comments    Pt asleep with CPAP on 4L on entry with SaO2 96%O2. Pt agreeable to pre TAVR assessment. Pt requires minA for bed mobility, minA for transfers and hands on min guard for ambualtion of 150 feet with Rollator. D/c plans remain appropriate at this time, however will reassess after TAVR 05/08/19.   Follow Up Recommendations  Home health PT;Supervision - Intermittent(through PACE)     Equipment Recommendations  None recommended by PT    Recommendations for Other Services OT consult     Precautions / Restrictions Precautions Precautions: Fall Precaution Comments: hx of fall Restrictions Weight Bearing Restrictions: No    Mobility  Bed Mobility Overal bed mobility: Needs Assistance Bed Mobility: Supine to Sit     Supine to sit: Min assist     General bed mobility comments: minA to pull against therapist to come to upright, once in upright able to scoot hips to EoB  Transfers Overall transfer level: Needs assistance Equipment used: 4-wheeled walker Transfers: Sit to/from Stand Sit to Stand: Min assist;From elevated surface;+2 physical assistance         General transfer comment: minA for power up from elevated bed surface and from Rollator   Ambulation/Gait Ambulation/Gait assistance: Min guard Gait Distance (Feet): 150 Feet Assistive device: Rolling walker (2 wheeled) Gait Pattern/deviations: Step-through pattern;Decreased step length - right;Decreased step length - left Gait velocity: slowed Gait velocity  interpretation: <1.31 ft/sec, indicative of household ambulator General Gait Details: min guard for safety, slow, mildly unsteady gait with Rollator, requires verbal and tactile cuing to guide Rollator in straight path       Balance Overall balance assessment: Needs assistance Sitting-balance support: Feet supported;No upper extremity supported Sitting balance-Leahy Scale: Fair     Standing balance support: Bilateral upper extremity supported Standing balance-Leahy Scale: Poor Standing balance comment: requires UE support                            Cognition Arousal/Alertness: Awake/alert Behavior During Therapy: WFL for tasks assessed/performed Overall Cognitive Status: Within Functional Limits for tasks assessed                                           General Comments General comments (skin integrity, edema, etc.): Pt on 4L CPAP on entry with SaO2 96%O2, changed to 2L via [place] for ambulation maintained SaO2%>90%O2 with ambulation, however dropped with sitting on Rollator to 87%O2 with pursed lipped breathing rebounded to 92%O2      Pertinent Vitals/Pain Pain Assessment: No/denies pain    Home Living Family/patient expects to be discharged to:: Private residence Living Arrangements: Alone Available Help at Discharge: Family;Personal care attendant;Available PRN/intermittently Type of Home: Apartment Home Access: Ramped entrance   Home Layout: One level Home Equipment: Walker - 4 wheels;Bedside commode;Shower seat;Grab bars - tub/shower;Cane - single point Additional Comments: PCA daily for several hours, PACE 2x/week    Prior Function Level  of Independence: Needs assistance  Gait / Transfers Assistance Needed: ambulates at home with cane or RW ADL's / Homemaking Assistance Needed: PCA (2 hrs am and 3 hrs pm) to assist with ADLS, IADLS, daughter does shopping, receives meals on wheels     PT Goals (current goals can now be found in the care  plan section) Acute Rehab PT Goals Patient Stated Goal: go home PT Goal Formulation: With patient Time For Goal Achievement: 05/15/19 Potential to Achieve Goals: Fair Progress towards PT goals: Progressing toward goals    Frequency    Min 3X/week      PT Plan Current plan remains appropriate       AM-PAC PT "6 Clicks" Mobility   Outcome Measure  Help needed turning from your back to your side while in a flat bed without using bedrails?: None Help needed moving from lying on your back to sitting on the side of a flat bed without using bedrails?: A Little Help needed moving to and from a bed to a chair (including a wheelchair)?: A Little Help needed standing up from a chair using your arms (e.g., wheelchair or bedside chair)?: A Little Help needed to walk in hospital room?: A Little Help needed climbing 3-5 steps with a railing? : A Lot 6 Click Score: 18    End of Session Equipment Utilized During Treatment: Gait belt;Oxygen Activity Tolerance: Patient tolerated treatment well Patient left: in chair;with call bell/phone within reach;with chair alarm set Nurse Communication: Mobility status PT Visit Diagnosis: Other abnormalities of gait and mobility (R26.89);Muscle weakness (generalized) (M62.81)     Time: 5797-2820 PT Time Calculation (min) (ACUTE ONLY): 49 min  Charges:  $Gait Training: 23-37 mins $Therapeutic Activity: 8-22 mins                     Edynn Gillock B. Migdalia Dk PT, DPT Acute Rehabilitation Services Pager [phone] Office [phone]    [place] 05/03/2019, 1:24 PM

## 2019-05-03 NOTE — Progress Notes (Signed)
CARDIAC REHAB PHASE I   PRE:  Rate/Rhythm: 60 paced    BP: sitting 96/59    SaO2: 93 2L  MODE:  Ambulation: 230 ft   POST:  Rate/Rhythm: 80 pacing    BP: sitting 171/82     SaO2: 87 2L, 89-91 4L  Pt sleepy, slow to get moving. Needs [place] to lead rollator and then able to walk fairly steady and quick pace. SOB with distance but increased distance today before sitting and resting. Sat x1 at 165 ft. Began on 2L but increased to 4L during walk. Return to recliner, call bell within reach. BP much higher. Will continue to follow as time allows. [place], ACSM 05/03/2019 3:26 PM

## 2019-05-03 NOTE — Progress Notes (Signed)
RT placed pt on CPAP dream station for the night on home setting of 5cmH20 with no O2 bled into the system. Pt respiratory status is stable at this time. RT will continue to monitor.

## 2019-05-04 ENCOUNTER — Inpatient Hospital Stay (HOSPITAL_COMMUNITY): Admission: RE | Admit: 2019-05-04 | Payer: Medicare (Managed Care) | Source: Ambulatory Visit

## 2019-05-04 ENCOUNTER — Other Ambulatory Visit (HOSPITAL_COMMUNITY): Payer: Medicare (Managed Care)

## 2019-05-04 LAB — BASIC METABOLIC PANEL
Anion gap: 14 (ref 5–15)
BUN: 63 mg/dL — ABNORMAL HIGH (ref 8–23)
CO2: 31 mmol/L (ref 22–32)
Calcium: 9 mg/dL (ref 8.9–10.3)
Chloride: 92 mmol/L — ABNORMAL LOW (ref 98–111)
Creatinine, Ser: 2.71 mg/dL — ABNORMAL HIGH (ref 0.61–1.24)
GFR calc Af Amer: 26 mL/min — ABNORMAL LOW (ref >60)
GFR calc non Af Amer: 23 mL/min — ABNORMAL LOW (ref >60)
Glucose, Bld: 131 mg/dL — ABNORMAL HIGH (ref 70–99)
Potassium: 3.3 mmol/L — ABNORMAL LOW (ref 3.5–5.1)
Sodium: 137 mmol/L (ref 135–145)

## 2019-05-04 LAB — GLUCOSE, CAPILLARY
Glucose-Capillary: 101 mg/dL — ABNORMAL HIGH (ref 70–99)
Glucose-Capillary: 186 mg/dL — ABNORMAL HIGH (ref 70–99)
Glucose-Capillary: 193 mg/dL — ABNORMAL HIGH (ref 70–99)
Glucose-Capillary: 248 mg/dL — ABNORMAL HIGH (ref 70–99)

## 2019-05-04 LAB — CBC
HCT: 36.9 % — ABNORMAL LOW (ref 39.0–52.0)
Hemoglobin: 11 g/dL — ABNORMAL LOW (ref 13.0–17.0)
MCH: 24.4 pg — ABNORMAL LOW (ref 26.0–34.0)
MCHC: 29.8 g/dL — ABNORMAL LOW (ref 30.0–36.0)
MCV: 82 fL (ref 80.0–100.0)
Platelets: 221 10*3/uL (ref 150–400)
RBC: 4.5 MIL/uL (ref 4.22–5.81)
RDW: 15.4 % (ref 11.5–15.5)
WBC: 13.3 10*3/uL — ABNORMAL HIGH (ref 4.0–10.5)
nRBC: 0 % (ref 0.0–0.2)

## 2019-05-04 MED ORDER — POTASSIUM CHLORIDE CRYS ER 20 MEQ PO TBCR
40.0000 meq | EXTENDED_RELEASE_TABLET | Freq: Once | ORAL | Status: AC
  Administered 2019-05-04: 40 meq via ORAL
  Filled 2019-05-04: qty 2

## 2019-05-04 NOTE — Progress Notes (Signed)
Occupational Therapy Treatment Patient Details Name: [name] MRN: [id number] DOB: August 28, 1945 Today's Date: 05/04/2019    History of present illness 73 year old gentleman with chronic kidney disease baseline creatinine ~2.5), morbid obesity, RV dysfunction, L metatarsal amputation and blindness. On 2L O2 at baseline. Pt came to ED with c/o of SoB. Admitted 04/28/19 for treatment of acute on chronic CHF, AKI, AS, HTN, CAD and paroxysmal A fib.    OT comments  Pt making steady progress towards OT goals. Session focus on increasing activity tolerance as precursor to higher level ADLs. Pt sit >stand from recliner with RW and MOD A to initially power up. Pt able to static stand for ~ 6 minutes while o2 sats remaining in 90s on [place]. Pt declined further functional mobility/ ADLs as lunch had just arrived at start of session. DC plan remains appropriate. Will continue to follow for acute OT needs.   Follow Up Recommendations  Home health OT    Equipment Recommendations  None recommended by OT    Recommendations for Other Services      Precautions / Restrictions Precautions Precautions: Fall Precaution Comments: hx of fall Restrictions Weight Bearing Restrictions: No       Mobility Bed Mobility               General bed mobility comments: oob at time of arrival  Transfers Overall transfer level: Needs assistance Equipment used: Rolling walker (2 wheeled) Transfers: Sit to/from Stand Sit to Stand: Mod assist         General transfer comment: MOD A to power up; pt declined further transfer but able to static stand ~ 6 minutes O2 remained in 90s on 3L    Balance Overall balance assessment: Needs assistance Sitting-balance support: Feet supported;No upper extremity supported Sitting balance-Leahy Scale: Fair Sitting balance - Comments: able to static sit without support   Standing balance support: Bilateral upper extremity supported Standing balance-Leahy Scale:  Poor Standing balance comment: requires UE support                           ADL either performed or assessed with clinical judgement   ADL Overall ADL's : At baseline                                       General ADL Comments: pt appears at baseline level for ADL completion, pt requires min assistance in all aspects of ADL;minguard-minA for functional mobiltiy with vc for navigating unfamiliar environment     Vision Baseline Vision/History: Legally blind     Perception     Praxis      Cognition Arousal/Alertness: Awake/alert Behavior During Therapy: WFL for tasks assessed/performed Overall Cognitive Status: Within Functional Limits for tasks assessed                                 General Comments: pleasant and agreeable to therapy        Exercises     Shoulder Instructions       General Comments pt on 3L [place] throughout session; o2 remained in 90s during functional activity    Pertinent Vitals/ Pain          Home Living  Prior Functioning/Environment              Frequency  Min 2X/week        Progress Toward Goals  OT Goals(current goals can now be found in the care plan section)  Progress towards OT goals: Progressing toward goals  Acute Rehab OT Goals Time For Goal Achievement: 05/16/19 Potential to Achieve Goals: Good  Plan      Co-evaluation                 AM-PAC OT "6 Clicks" Daily Activity     Outcome Measure   Help from another person eating meals?: Total Help from another person taking care of personal grooming?: A Little Help from another person toileting, which includes using toliet, bedpan, or urinal?: A Little Help from another person bathing (including washing, rinsing, drying)?: A Little Help from another person to put on and taking off regular upper body clothing?: A Little Help from another person to put on and  taking off regular lower body clothing?: A Little 6 Click Score: 16    End of Session Equipment Utilized During Treatment: Rolling walker;Oxygen  OT Visit Diagnosis: Other abnormalities of gait and mobility (R26.89);Muscle weakness (generalized) (M62.81);Low vision, both eyes (H54.2)   Activity Tolerance Patient tolerated treatment well   Patient Left in chair;with call bell/phone within reach   Nurse Communication          Time: 3202-3343 OT Time Calculation (min): 21 min  Charges: OT General Charges $OT Visit: 1 Visit OT Treatments $Therapeutic Activity: 8-22 mins  [place], [place] Acute Rehabilitation Services [phone] [place] 05/04/2019, 2:02 PM

## 2019-05-04 NOTE — Progress Notes (Signed)
Orthopedic Tech Progress Note Patient Details:  Bird Swetz September 25, 1945 [id number] Removed the old pair, washed and added a little lotion to the skin the reapplied new unna boots Ortho Devices Type of Ortho Device: Haematologist Ortho Device/Splint Location: bilateral Ortho Device/Splint Interventions: Adjustment, Application, Ordered, Removal   Post Interventions Patient Tolerated: Well Instructions Provided: Care of device, Adjustment of device   Janit Pagan 05/04/2019, 11:08 AM

## 2019-05-04 NOTE — Progress Notes (Signed)
CARDIAC REHAB PHASE I   PRE:  Rate/Rhythm: 61 paced  BP:  Sitting: 143/70      SaO2: 95 2L  MODE:  Ambulation: 470 ft   POST:  Rate/Rhythm: 82 paced with PVCs  BP:  Sitting: 173/81    SaO2: 86-96 3L  Pt ambulated 445ft in hallway assist of one with another guiding the rollator. Pt took one long sitting rest break c/o SOB. Pt coached through purse lipped breathing. Pt returned to recliner, call bell and bedside table within reach. Pt asking for a replacement condom cath, RN aware. Encouraged continued ambulation this weekend. Will continue to encourage ambulation.  [id number] Rufina Falco, RN BSN 05/04/2019 10:12 AM

## 2019-05-04 NOTE — Progress Notes (Signed)
Patient ID: Alma Muegge, male   DOB: 1946/07/22, 73 y.o.   MRN: [id number]     Advanced Heart Failure Rounding Note  PCP-Cardiologist: No primary care provider on file.   Subjective:    Weight is stable, creatinine mildly higher at 2.7.  He is back on po torsemide.   He is agreeable to staying in the hospital for TAVR. Denies SOB.   Objective:   Weight Range: (!) 141.6 kg Body mass index is 42.34 kg/m.   Vital Signs:   Temp:  [97.5 F (36.4 C)-98.3 F (36.8 C)] 98.3 F (36.8 C) (09/18 0539) Pulse Rate:  [60-68] 68 (09/18 0911) Resp:  [20-22] 20 (09/17 2220) BP: (124-154)/(60-80) 124/60 (09/18 0911) SpO2:  [90 %-99 %] 99 % (09/18 0539) Weight:  [141.6 kg] 141.6 kg (09/18 0539) Last BM Date: 05/03/19  Weight change: Filed Weights   05/02/19 0351 05/03/19 0523 05/04/19 0539  Weight: (!) 139.3 kg (!) 142 kg (!) 141.6 kg    Intake/Output:   Intake/Output Summary (Last 24 hours) at 05/04/2019 1309 Last data filed at 05/04/2019 1300 Gross per 24 hour  Intake 628 ml  Output 1025 ml  Net -397 ml      Physical Exam   General: NAD Neck: Thick, JVP difficult, no thyromegaly or thyroid nodule.  Lungs: Distant BS. CV: Nonpalpable PMI.  Heart regular S1/S2, no B3/A1, 3/6 systolic crescendo-decrescendo murmur RUSB with muffled S2.  1+ edema to knees.    Abdomen: Soft, nontender, no hepatosplenomegaly, no distention.  Skin: Intact without lesions or rashes.  Neurologic: Alert and oriented x 3.  Psych: Normal affect. Extremities: No clubbing or cyanosis.  HEENT: Normal.   Telemetry  NSR 60-70s   Labs    CBC Recent Labs    05/03/19 0403 05/04/19 0319  WBC 12.5* 13.3*  HGB 10.5* 11.0*  HCT 35.4* 36.9*  MCV 82.7 82.0  PLT 198 937   Basic Metabolic Panel Recent Labs    05/02/19 0321 05/03/19 0403 05/04/19 0319  NA 137 136 137  K 3.9 3.4* 3.3*  CL 95* 93* 92*  CO2 29 32 31  GLUCOSE 152* 114* 131*  BUN 60* 59* 63*  CREATININE 2.63* 2.57* 2.71*   CALCIUM 8.9 8.6* 9.0  MG 2.2  --   --    Liver Function Tests No results for input(s): AST, ALT, ALKPHOS, BILITOT, PROT, ALBUMIN in the last 72 hours. No results for input(s): LIPASE, AMYLASE in the last 72 hours. Cardiac Enzymes No results for input(s): CKTOTAL, CKMB, CKMBINDEX, TROPONINI in the last 72 hours.  BNP: BNP (last 3 results) Recent Labs    08/31/18 1406 04/28/19 1011  BNP 151.0* 140.8*    ProBNP (last 3 results) No results for input(s): PROBNP in the last 8760 hours.   D-Dimer No results for input(s): DDIMER in the last 72 hours. Hemoglobin A1C No results for input(s): HGBA1C in the last 72 hours. Fasting Lipid Panel No results for input(s): CHOL, HDL, LDLCALC, TRIG, CHOLHDL, LDLDIRECT in the last 72 hours. Thyroid Function Tests No results for input(s): TSH, T4TOTAL, T3FREE, THYROIDAB in the last 72 hours.  Invalid input(s): FREET3  Other results:   Imaging    No results found.   Medications:     Scheduled Medications: . amLODipine  10 mg Oral Daily  . aspirin EC  81 mg Oral Daily  . cholecalciferol  1,000 Units Oral Daily  . citalopram  20 mg Oral Daily  . clopidogrel  75 mg Oral Daily  .  famotidine  20 mg Oral Daily  . febuxostat  80 mg Oral Daily  . gabapentin  300 mg Oral BID  . heparin  5,000 Units Subcutaneous Q8H  . hydrALAZINE  75 mg Oral TID  . insulin aspart  0-20 Units Subcutaneous TID WC  . insulin glargine  70 Units Subcutaneous QHS  . mouth rinse  15 mL Mouth Rinse BID  . metoprolol succinate  50 mg Oral Daily  . rosuvastatin  20 mg Oral Daily  . senna-docusate  2 tablet Oral BID  . sodium chloride flush  3 mL Intravenous Q12H  . torsemide  120 mg Oral BID    Infusions: . sodium chloride      PRN Medications: sodium chloride, acetaminophen, albuterol, antiseptic oral rinse, ondansetron (ZOFRAN) IV, polyethylene glycol, sodium chloride flush   Assessment/Plan   1. Acute on chronic diastolic CHF with prominent RV  failure: Volume difficult to manage in setting of RV failure (OHS/OSA), CKD, and severe AS.  Echo this admission with EF 60-65%, RV reportedly normal, mod-severe aortic stenosis with mean gradient 30 mmHg. He was admitted with increased dyspnea (at rest).  Creatinine noted to be worse, suspect contrast Nephropathy from recent cath as well as cardiorenal. Volume status seems ok now, suspect mild overload at most.  With creatinine up to 2.7, he is now on po torsemide.  - Continue po torsemide today as creatinine higher, may add metolazone tomorrow.   2. Aortic stenosis:  Suspect this has progressed to severe AS based on the totality of recent studies.  He is a very difficult TAVR candidate with OHS/OSA, significant diastolic failure, poor mobility, and CKD.  Currently scheduled for TAVR on 9/22, will keep in hospital until then. 3. Atrial fibrillation:  He appears to be in sinus with very low voltage Ps (regular rhythm).  He is not anticoagulated (takes ASA) due to prior intracerebral hemorrhage. 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   - As he is not anticoagulated, would continue rate control.  Cannot do DCCV without anticoagulation.  - Continue Toprol XL 50 mg daily.  4. CAD: Nonobstructive on 8/20 cath.  No chest pain. - Continue ASA and statin.   5. 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  6. AKI on CKD stage III: creatinine 1.97 prior, up to 2.78 at admission.  May have had contrast nephropathy from recent cath, also suspect cardiorenal syndrome.  Creatinine 2.53 ->2.39->2.63->2.57 ->2.7.  - He may end up needing HD for volume management eventually, at baseline he is on very high diuretic doses.  7. OHS/OSA: Continue nightly CPAP and home oxygen.  8. DM: Blood glucose improved with adjustment to insulin.  Continue mobilize. Plan to keep in the hospital until after surgery.   Length of Stay: 6  Loralie Champagne, MD   05/04/2019, 1:09 PM  Advanced Heart Failure Team Pager [phone] (M-F; 7a - 4p)  Please contact [facility] for night-coverage after hours (4p -7a ) and weekends on [email or web address]

## 2019-05-04 NOTE — Progress Notes (Signed)
Placed on CPAP and tolerating well at this time. 2l O2 bleeding in.

## 2019-05-04 NOTE — Care Management Important Message (Signed)
Important Message  Patient Details  Name: [name] MRN: [id number] Date of Birth: 11-Oct-1945   Medicare Important Message Given:  Yes     Shelda Altes 05/04/2019, 1:28 PM

## 2019-05-05 LAB — BASIC METABOLIC PANEL
Anion gap: 13 (ref 5–15)
BUN: 67 mg/dL — ABNORMAL HIGH (ref 8–23)
CO2: 30 mmol/L (ref 22–32)
Calcium: 9.1 mg/dL (ref 8.9–10.3)
Chloride: 92 mmol/L — ABNORMAL LOW (ref 98–111)
Creatinine, Ser: 2.6 mg/dL — ABNORMAL HIGH (ref 0.61–1.24)
GFR calc Af Amer: 28 mL/min — ABNORMAL LOW (ref >60)
GFR calc non Af Amer: 24 mL/min — ABNORMAL LOW (ref >60)
Glucose, Bld: 131 mg/dL — ABNORMAL HIGH (ref 70–99)
Potassium: 3.5 mmol/L (ref 3.5–5.1)
Sodium: 135 mmol/L (ref 135–145)

## 2019-05-05 LAB — CBC
HCT: 35.6 % — ABNORMAL LOW (ref 39.0–52.0)
Hemoglobin: 10.6 g/dL — ABNORMAL LOW (ref 13.0–17.0)
MCH: 24.5 pg — ABNORMAL LOW (ref 26.0–34.0)
MCHC: 29.8 g/dL — ABNORMAL LOW (ref 30.0–36.0)
MCV: 82.2 fL (ref 80.0–100.0)
Platelets: 214 10*3/uL (ref 150–400)
RBC: 4.33 MIL/uL (ref 4.22–5.81)
RDW: 15.6 % — ABNORMAL HIGH (ref 11.5–15.5)
WBC: 14.8 10*3/uL — ABNORMAL HIGH (ref 4.0–10.5)
nRBC: 0 % (ref 0.0–0.2)

## 2019-05-05 LAB — GLUCOSE, CAPILLARY
Glucose-Capillary: 147 mg/dL — ABNORMAL HIGH (ref 70–99)
Glucose-Capillary: 156 mg/dL — ABNORMAL HIGH (ref 70–99)
Glucose-Capillary: 148 mg/dL — ABNORMAL HIGH (ref 70–99)
Glucose-Capillary: 259 mg/dL — ABNORMAL HIGH (ref 70–99)

## 2019-05-05 NOTE — Progress Notes (Signed)
Patient ID: [name], male   DOB: Apr 02, 1946, 73 y.o.   MRN: [id number] Patient ID: [name], male   DOB: 09-06-1945, 73 y.o.   MRN: [id number]     Advanced Heart Failure Rounding Note  PCP-Cardiologist: No primary care provider on file.   Subjective:    Weight is stable, creatinine stable at 2.6.  He is back on po torsemide.   He is agreeable to staying in the hospital for TAVR. Comfortable, no dyspnea at rest but not very active.   Objective:   Weight Range: (!) 139.7 kg Body mass index is 41.77 kg/m.   Vital Signs:   Temp:  [98 F (36.7 C)-98.2 F (36.8 C)] 98.2 F (36.8 C) (09/19 0700) Pulse Rate:  [60-65] 62 (09/19 0933) Resp:  [16-22] 20 (09/19 0700) BP: (137-145)/(74-86) 137/79 (09/19 0700) SpO2:  [95 %-97 %] 96 % (09/19 0700) Weight:  [139.7 kg] 139.7 kg (09/19 0700) Last BM Date: 05/03/19  Weight change: Filed Weights   05/03/19 0523 05/04/19 0539 05/05/19 0700  Weight: (!) 142 kg (!) 141.6 kg (!) 139.7 kg    Intake/Output:   Intake/Output Summary (Last 24 hours) at 05/05/2019 1136 Last data filed at 05/04/2019 1900 Gross per 24 hour  Intake 458 ml  Output 925 ml  Net -467 ml      Physical Exam   General: NAD Neck: Thick, JVP 8-9 cm, no thyromegaly or thyroid nodule.  Lungs: Clear to auscultation bilaterally with normal respiratory effort. CV: Nondisplaced PMI.  Heart regular S1/S2, no H8/E9, 3/6 systolic crescendo-decrescendo murmur RUSB with muffled S2. 1+ ankle edema.   Abdomen: Soft, nontender, no hepatosplenomegaly, no distention.  Skin: Intact without lesions or rashes.  Neurologic: Alert and oriented x 3.  Psych: Normal affect. Extremities: No clubbing or cyanosis.  HEENT: Normal.    Telemetry  NSR 60-70s alternating with A-V sequential pacing (personally reviewed)  Labs    CBC Recent Labs    05/04/19 0319 05/05/19 0358  WBC 13.3* 14.8*  HGB 11.0* 10.6*  HCT 36.9* 35.6*  MCV 82.0 82.2  PLT 221 937   Basic Metabolic  Panel Recent Labs    05/04/19 0319 05/05/19 0358  NA 137 135  K 3.3* 3.5  CL 92* 92*  CO2 31 30  GLUCOSE 131* 131*  BUN 63* 67*  CREATININE 2.71* 2.60*  CALCIUM 9.0 9.1   Liver Function Tests No results for input(s): AST, ALT, ALKPHOS, BILITOT, PROT, ALBUMIN in the last 72 hours. No results for input(s): LIPASE, AMYLASE in the last 72 hours. Cardiac Enzymes No results for input(s): CKTOTAL, CKMB, CKMBINDEX, TROPONINI in the last 72 hours.  BNP: BNP (last 3 results) Recent Labs    08/31/18 1406 04/28/19 1011  BNP 151.0* 140.8*    ProBNP (last 3 results) No results for input(s): PROBNP in the last 8760 hours.   D-Dimer No results for input(s): DDIMER in the last 72 hours. Hemoglobin A1C No results for input(s): HGBA1C in the last 72 hours. Fasting Lipid Panel No results for input(s): CHOL, HDL, LDLCALC, TRIG, CHOLHDL, LDLDIRECT in the last 72 hours. Thyroid Function Tests No results for input(s): TSH, T4TOTAL, T3FREE, THYROIDAB in the last 72 hours.  Invalid input(s): FREET3  Other results:   Imaging    No results found.   Medications:     Scheduled Medications: . amLODipine  10 mg Oral Daily  . aspirin EC  81 mg Oral Daily  . cholecalciferol  1,000 Units Oral Daily  . citalopram  20 mg Oral Daily  . clopidogrel  75 mg Oral Daily  . famotidine  20 mg Oral Daily  . febuxostat  80 mg Oral Daily  . gabapentin  300 mg Oral BID  . heparin  5,000 Units Subcutaneous Q8H  . hydrALAZINE  75 mg Oral TID  . insulin aspart  0-20 Units Subcutaneous TID WC  . insulin glargine  70 Units Subcutaneous QHS  . mouth rinse  15 mL Mouth Rinse BID  . metoprolol succinate  50 mg Oral Daily  . rosuvastatin  20 mg Oral Daily  . senna-docusate  2 tablet Oral BID  . sodium chloride flush  3 mL Intravenous Q12H  . torsemide  120 mg Oral BID    Infusions: . sodium chloride      PRN Medications: sodium chloride, acetaminophen, albuterol, antiseptic oral rinse,  ondansetron (ZOFRAN) IV, polyethylene glycol, sodium chloride flush   Assessment/Plan   1. Acute on chronic diastolic CHF with prominent RV failure: Volume difficult to manage in setting of RV failure (OHS/OSA), CKD, and severe AS.  Echo this admission with EF 60-65%, RV reportedly normal, mod-severe aortic stenosis with mean gradient 30 mmHg. He was admitted with increased dyspnea (at rest).  Creatinine noted to be worse, suspect contrast Nephropathy from recent cath as well as cardiorenal. Volume status seems ok now, suspect mild overload.  Creatinine stable at 2.6 today.  - Continue po torsemide today, with creatinine still above baseline will not give metolazone yet (possibly tomorrow).   2. Aortic stenosis:  Suspect this has progressed to severe AS based on the totality of recent studies.  He is a difficult TAVR candidate with OHS/OSA, significant diastolic failure, poor mobility, and CKD.  Currently scheduled for TAVR on 9/22, will keep in hospital until then. 3. Atrial fibrillation:  He appears to be in sinus with very low voltage Ps (regular rhythm) alternating with a-pacing.  He is not anticoagulated (takes ASA) due to prior intracerebral hemorrhage. 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    - Continue Toprol XL 50 mg daily.  4. CAD: Nonobstructive on 8/20 cath.  No chest pain. - Continue ASA and statin.   5. 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  6. AKI on CKD stage III: creatinine 1.97 prior, up to 2.78 at admission.  May have had contrast nephropathy from recent cath, also suspect cardiorenal syndrome.  Creatinine 2.53 ->2.39->2.63->2.57 ->2.7 -> 2.6.  - He may end up needing HD for volume management eventually, at baseline he is on very high diuretic doses.  7. OHS/OSA: Continue nightly CPAP and home oxygen.  8. DM: Blood glucose improved with adjustment to insulin.  Continue mobilize. Plan to keep in the  hospital until after TAVR  Length of Stay: [place], MD  05/05/2019, 11:36 AM  Advanced Heart Failure Team Pager [phone] (M-F; [place])  Please contact [facility] for night-coverage after hours (4p -7a ) and weekends on [email or web address]

## 2019-05-06 LAB — CBC
HCT: 36.4 % — ABNORMAL LOW (ref 39.0–52.0)
Hemoglobin: 10.8 g/dL — ABNORMAL LOW (ref 13.0–17.0)
MCH: 24.4 pg — ABNORMAL LOW (ref 26.0–34.0)
MCHC: 29.7 g/dL — ABNORMAL LOW (ref 30.0–36.0)
MCV: 82.2 fL (ref 80.0–100.0)
Platelets: 196 10*3/uL (ref 150–400)
RBC: 4.43 MIL/uL (ref 4.22–5.81)
RDW: 15.7 % — ABNORMAL HIGH (ref 11.5–15.5)
WBC: 13.4 10*3/uL — ABNORMAL HIGH (ref 4.0–10.5)
nRBC: 0 % (ref 0.0–0.2)

## 2019-05-06 LAB — GLUCOSE, CAPILLARY
Glucose-Capillary: 206 mg/dL — ABNORMAL HIGH (ref 70–99)
Glucose-Capillary: 227 mg/dL — ABNORMAL HIGH (ref 70–99)
Glucose-Capillary: 213 mg/dL — ABNORMAL HIGH (ref 70–99)
Glucose-Capillary: 175 mg/dL — ABNORMAL HIGH (ref 70–99)

## 2019-05-06 LAB — BASIC METABOLIC PANEL
Anion gap: 13 (ref 5–15)
BUN: 65 mg/dL — ABNORMAL HIGH (ref 8–23)
CO2: 30 mmol/L (ref 22–32)
Calcium: 9 mg/dL (ref 8.9–10.3)
Chloride: 90 mmol/L — ABNORMAL LOW (ref 98–111)
Creatinine, Ser: 2.57 mg/dL — ABNORMAL HIGH (ref 0.61–1.24)
GFR calc Af Amer: 28 mL/min — ABNORMAL LOW (ref >60)
GFR calc non Af Amer: 24 mL/min — ABNORMAL LOW (ref >60)
Glucose, Bld: 205 mg/dL — ABNORMAL HIGH (ref 70–99)
Potassium: 3.4 mmol/L — ABNORMAL LOW (ref 3.5–5.1)
Sodium: 133 mmol/L — ABNORMAL LOW (ref 135–145)

## 2019-05-06 MED ORDER — POTASSIUM CHLORIDE CRYS ER 20 MEQ PO TBCR
40.0000 meq | EXTENDED_RELEASE_TABLET | Freq: Once | ORAL | Status: AC
  Administered 2019-05-06: 17:00:00 40 meq via ORAL
  Filled 2019-05-06: qty 2

## 2019-05-06 NOTE — Progress Notes (Signed)
Patient ID: [name], male   DOB: 04-03-1946, 73 y.o.   MRN: [id number]     Advanced Heart Failure Rounding Note  PCP-Cardiologist: No primary care provider on file.   Subjective:    Weight down with good UOP yesterday.  Pending BMET.   Comfortable, no dyspnea at rest but not very active.   Objective:   Weight Range: (!) 139.7 kg Body mass index is 41.77 kg/m.   Vital Signs:   Temp:  [98.3 F (36.8 C)] 98.3 F (36.8 C) (09/19 1524) Pulse Rate:  [56-61] 61 (09/20 0600) Resp:  [17-32] 25 (09/20 0600) BP: (131-141)/(69-75) 139/69 (09/19 2118) SpO2:  [95 %-100 %] 98 % (09/20 0600) Last BM Date: 05/03/19  Weight change: Filed Weights   05/03/19 0523 05/04/19 0539 05/05/19 0700  Weight: (!) 142 kg (!) 141.6 kg (!) 139.7 kg    Intake/Output:   Intake/Output Summary (Last 24 hours) at 05/06/2019 1207 Last data filed at 05/06/2019 0655 Gross per 24 hour  Intake 113 ml  Output 2750 ml  Net -2637 ml      Physical Exam   General: NAD Neck: Thick, no JVD, no thyromegaly or thyroid nodule.  Lungs: Clear to auscultation bilaterally with normal respiratory effort. CV: Nonpable PMI.  Heart regular S1/S2, no T5/V7, 3/6 systolic crescendo-decrescendo systolic murmur RUSB.  1+ ankle edema.   Abdomen: Soft, nontender, no hepatosplenomegaly, no distention.  Skin: Intact without lesions or rashes.  Neurologic: Alert and oriented x 3.  Psych: Normal affect. Extremities: No clubbing or cyanosis.  HEENT: Normal.    Telemetry   A-V sequential pacing (personally reviewed)  Labs    CBC Recent Labs    05/05/19 0358 05/06/19 0331  WBC 14.8* 13.4*  HGB 10.6* 10.8*  HCT 35.6* 36.4*  MCV 82.2 82.2  PLT 214 616   Basic Metabolic Panel Recent Labs    05/04/19 0319 05/05/19 0358  NA 137 135  K 3.3* 3.5  CL 92* 92*  CO2 31 30  GLUCOSE 131* 131*  BUN 63* 67*  CREATININE 2.71* 2.60*  CALCIUM 9.0 9.1   Liver Function Tests No results for input(s): AST, ALT,  ALKPHOS, BILITOT, PROT, ALBUMIN in the last 72 hours. No results for input(s): LIPASE, AMYLASE in the last 72 hours. Cardiac Enzymes No results for input(s): CKTOTAL, CKMB, CKMBINDEX, TROPONINI in the last 72 hours.  BNP: BNP (last 3 results) Recent Labs    08/31/18 1406 04/28/19 1011  BNP 151.0* 140.8*    ProBNP (last 3 results) No results for input(s): PROBNP in the last 8760 hours.   D-Dimer No results for input(s): DDIMER in the last 72 hours. Hemoglobin A1C No results for input(s): HGBA1C in the last 72 hours. Fasting Lipid Panel No results for input(s): CHOL, HDL, LDLCALC, TRIG, CHOLHDL, LDLDIRECT in the last 72 hours. Thyroid Function Tests No results for input(s): TSH, T4TOTAL, T3FREE, THYROIDAB in the last 72 hours.  Invalid input(s): FREET3  Other results:   Imaging    No results found.   Medications:     Scheduled Medications: . amLODipine  10 mg Oral Daily  . aspirin EC  81 mg Oral Daily  . cholecalciferol  1,000 Units Oral Daily  . citalopram  20 mg Oral Daily  . clopidogrel  75 mg Oral Daily  . famotidine  20 mg Oral Daily  . febuxostat  80 mg Oral Daily  . gabapentin  300 mg Oral BID  . heparin  5,000 Units Subcutaneous Q8H  .  hydrALAZINE  75 mg Oral TID  . insulin aspart  0-20 Units Subcutaneous TID WC  . insulin glargine  70 Units Subcutaneous QHS  . mouth rinse  15 mL Mouth Rinse BID  . metoprolol succinate  50 mg Oral Daily  . rosuvastatin  20 mg Oral Daily  . senna-docusate  2 tablet Oral BID  . sodium chloride flush  3 mL Intravenous Q12H  . torsemide  120 mg Oral BID    Infusions: . sodium chloride      PRN Medications: sodium chloride, acetaminophen, albuterol, antiseptic oral rinse, ondansetron (ZOFRAN) IV, polyethylene glycol, sodium chloride flush   Assessment/Plan   1. Acute on chronic diastolic CHF with prominent RV failure: Volume difficult to manage in setting of RV failure (OHS/OSA), CKD, and severe AS.  Echo this  admission with EF 60-65%, RV reportedly normal, mod-severe aortic stenosis with mean gradient 30 mmHg. He was admitted with increased dyspnea (at rest).  Creatinine noted to be worse, suspect contrast Nephropathy from recent cath as well as cardiorenal. Volume status seems ok now, suspect at most mild overload.  Pending BMET today.   - Continue po torsemide today, with weight down today will not give metolazone yet (possibly tomorrow).   2. Aortic stenosis:  Suspect this has progressed to severe AS based on the totality of recent studies.  He is a difficult TAVR candidate with OHS/OSA, significant diastolic failure, poor mobility, and CKD.  Currently scheduled for TAVR on 9/22, will keep in hospital until then. 3. Atrial fibrillation:  He appears to be in sinus with very low voltage Ps (regular rhythm) alternating with a-pacing.  He is not anticoagulated (takes ASA) due to prior intracerebral hemorrhage. 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    - Continue Toprol XL 50 mg daily.  4. CAD: Nonobstructive on 8/20 cath.  No chest pain. - Continue ASA and statin.   5. 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  6. AKI on CKD stage III: creatinine 1.97 prior, up to 2.78 at admission.  May have had contrast nephropathy from recent cath, also suspect cardiorenal syndrome.  Creatinine 2.53 ->2.39->2.63->2.57 ->2.7 -> 2.6, pending BMET today.  - He may end up needing HD for volume management eventually, at baseline he is on very high diuretic doses.  7. OHS/OSA: Continue nightly CPAP and home oxygen.  8. DM: Blood glucose improved with adjustment to insulin.  Continue mobilize. Plan to keep in the hospital until after TAVR  Length of Stay: [place], MD  05/06/2019, 12:07 PM  Advanced Heart Failure Team Pager [phone] (M-F; 7a - 4p)  Please contact [facility] for night-coverage after hours (4p -7a ) and weekends on  [email or web address]

## 2019-05-06 NOTE — Progress Notes (Signed)
Placed patient on CPAP for the night with oxygen set at 2lpm. 

## 2019-05-07 LAB — CBC
HCT: 36.5 % — ABNORMAL LOW (ref 39.0–52.0)
HCT: 37.4 % — ABNORMAL LOW (ref 39.0–52.0)
Hemoglobin: 10.9 g/dL — ABNORMAL LOW (ref 13.0–17.0)
Hemoglobin: 11.1 g/dL — ABNORMAL LOW (ref 13.0–17.0)
MCH: 24.5 pg — ABNORMAL LOW (ref 26.0–34.0)
MCH: 24.6 pg — ABNORMAL LOW (ref 26.0–34.0)
MCHC: 29.9 g/dL — ABNORMAL LOW (ref 30.0–36.0)
MCHC: 29.7 g/dL — ABNORMAL LOW (ref 30.0–36.0)
MCV: 82.2 fL (ref 80.0–100.0)
MCV: 82.9 fL (ref 80.0–100.0)
Platelets: 205 10*3/uL (ref 150–400)
Platelets: 221 10*3/uL (ref 150–400)
RBC: 4.44 MIL/uL (ref 4.22–5.81)
RBC: 4.51 MIL/uL (ref 4.22–5.81)
RDW: 15.7 % — ABNORMAL HIGH (ref 11.5–15.5)
RDW: 15.8 % — ABNORMAL HIGH (ref 11.5–15.5)
WBC: 14.2 10*3/uL — ABNORMAL HIGH (ref 4.0–10.5)
WBC: 15 10*3/uL — ABNORMAL HIGH (ref 4.0–10.5)
nRBC: 0 % (ref 0.0–0.2)
nRBC: 0 % (ref 0.0–0.2)

## 2019-05-07 LAB — COMPREHENSIVE METABOLIC PANEL
ALT: 23 U/L (ref 0–44)
AST: 22 U/L (ref 15–41)
Albumin: 2.8 g/dL — ABNORMAL LOW (ref 3.5–5.0)
Alkaline Phosphatase: 76 U/L (ref 38–126)
Anion gap: 11 (ref 5–15)
BUN: 64 mg/dL — ABNORMAL HIGH (ref 8–23)
CO2: 34 mmol/L — ABNORMAL HIGH (ref 22–32)
Calcium: 9 mg/dL (ref 8.9–10.3)
Chloride: 90 mmol/L — ABNORMAL LOW (ref 98–111)
Creatinine, Ser: 2.55 mg/dL — ABNORMAL HIGH (ref 0.61–1.24)
GFR calc Af Amer: 28 mL/min — ABNORMAL LOW (ref >60)
GFR calc non Af Amer: 24 mL/min — ABNORMAL LOW (ref >60)
Glucose, Bld: 247 mg/dL — ABNORMAL HIGH (ref 70–99)
Potassium: 3.6 mmol/L (ref 3.5–5.1)
Sodium: 135 mmol/L (ref 135–145)
Total Bilirubin: 0.6 mg/dL (ref 0.3–1.2)
Total Protein: 7.5 g/dL (ref 6.5–8.1)

## 2019-05-07 LAB — GLUCOSE, CAPILLARY
Glucose-Capillary: 122 mg/dL — ABNORMAL HIGH (ref 70–99)
Glucose-Capillary: 185 mg/dL — ABNORMAL HIGH (ref 70–99)
Glucose-Capillary: 201 mg/dL — ABNORMAL HIGH (ref 70–99)
Glucose-Capillary: 174 mg/dL — ABNORMAL HIGH (ref 70–99)

## 2019-05-07 LAB — URINALYSIS, ROUTINE W REFLEX MICROSCOPIC
Bilirubin Urine: NEGATIVE
Glucose, UA: NEGATIVE mg/dL
Hgb urine dipstick: NEGATIVE
Ketones, ur: NEGATIVE mg/dL
Leukocytes,Ua: NEGATIVE
Nitrite: NEGATIVE
Protein, ur: 100 mg/dL — AB
Specific Gravity, Urine: 1.011 (ref 1.005–1.030)
pH: 9 — ABNORMAL HIGH (ref 5.0–8.0)

## 2019-05-07 LAB — BLOOD GAS, ARTERIAL
Acid-Base Excess: 9.2 mmol/L — ABNORMAL HIGH (ref 0.0–2.0)
Bicarbonate: 33.7 mmol/L — ABNORMAL HIGH (ref 20.0–28.0)
Drawn by: 270111
O2 Content: 2 L/min
O2 Saturation: 95.9 %
Patient temperature: 98.6
pCO2 arterial: 51.3 mmHg — ABNORMAL HIGH (ref 32.0–48.0)
pH, Arterial: 7.433 (ref 7.350–7.450)
pO2, Arterial: 81.3 mmHg — ABNORMAL LOW (ref 83.0–108.0)

## 2019-05-07 LAB — PROTIME-INR
INR: 1.2 (ref 0.8–1.2)
Prothrombin Time: 14.7 seconds (ref 11.4–15.2)

## 2019-05-07 LAB — BASIC METABOLIC PANEL
Anion gap: 14 (ref 5–15)
BUN: 63 mg/dL — ABNORMAL HIGH (ref 8–23)
CO2: 31 mmol/L (ref 22–32)
Calcium: 9 mg/dL (ref 8.9–10.3)
Chloride: 90 mmol/L — ABNORMAL LOW (ref 98–111)
Creatinine, Ser: 2.52 mg/dL — ABNORMAL HIGH (ref 0.61–1.24)
GFR calc Af Amer: 29 mL/min — ABNORMAL LOW (ref >60)
GFR calc non Af Amer: 25 mL/min — ABNORMAL LOW (ref >60)
Glucose, Bld: 134 mg/dL — ABNORMAL HIGH (ref 70–99)
Potassium: 3.6 mmol/L (ref 3.5–5.1)
Sodium: 135 mmol/L (ref 135–145)

## 2019-05-07 LAB — HEMOGLOBIN A1C
Hgb A1c MFr Bld: 8 % — ABNORMAL HIGH (ref 4.8–5.6)
Mean Plasma Glucose: 182.9 mg/dL

## 2019-05-07 LAB — SURGICAL PCR SCREEN
MRSA, PCR: NEGATIVE
Staphylococcus aureus: POSITIVE — AB

## 2019-05-07 LAB — BRAIN NATRIURETIC PEPTIDE: B Natriuretic Peptide: 115.7 pg/mL — ABNORMAL HIGH (ref 0.0–100.0)

## 2019-05-07 LAB — SARS CORONAVIRUS 2 (TAT 6-24 HRS): SARS Coronavirus 2: NEGATIVE

## 2019-05-07 LAB — APTT: aPTT: 39 seconds — ABNORMAL HIGH (ref 24–36)

## 2019-05-07 MED ORDER — CHLORHEXIDINE GLUCONATE 4 % EX LIQD
60.0000 mL | Freq: Once | CUTANEOUS | Status: AC
  Administered 2019-05-08: 4 via TOPICAL
  Filled 2019-05-07: qty 60

## 2019-05-07 MED ORDER — NOREPINEPHRINE 4 MG/250ML-% IV SOLN
0.0000 ug/min | INTRAVENOUS | Status: DC
  Filled 2019-05-07: qty 250

## 2019-05-07 MED ORDER — CHLORHEXIDINE GLUCONATE 0.12 % MT SOLN
15.0000 mL | Freq: Once | OROMUCOSAL | Status: AC
  Administered 2019-05-08: 15 mL via OROMUCOSAL

## 2019-05-07 MED ORDER — CHLORHEXIDINE GLUCONATE 4 % EX LIQD
60.0000 mL | Freq: Once | CUTANEOUS | Status: AC
  Administered 2019-05-07: 4 via TOPICAL

## 2019-05-07 MED ORDER — SODIUM CHLORIDE 0.9 % IV SOLN
1.5000 g | INTRAVENOUS | Status: DC
  Filled 2019-05-07: qty 1.5

## 2019-05-07 MED ORDER — SODIUM CHLORIDE 0.9 % IV SOLN
INTRAVENOUS | Status: DC
  Filled 2019-05-07: qty 30

## 2019-05-07 MED ORDER — CHLORHEXIDINE GLUCONATE 4 % EX LIQD: 30.0000 mL | CUTANEOUS | Status: DC

## 2019-05-07 MED ORDER — SODIUM CHLORIDE 0.9 % IV SOLN
INTRAVENOUS | Status: DC
  Administered 2019-05-08: 10:00:00 via INTRAVENOUS

## 2019-05-07 MED ORDER — MUPIROCIN 2 % EX OINT
1.0000 "application " | TOPICAL_OINTMENT | Freq: Two times a day (BID) | CUTANEOUS | Status: DC
  Administered 2019-05-07 – 2019-05-11 (×8): 1 via NASAL
  Filled 2019-05-07 (×3): qty 22

## 2019-05-07 MED ORDER — MAGNESIUM SULFATE 50 % IJ SOLN
40.0000 meq | INTRAMUSCULAR | Status: DC
  Filled 2019-05-07: qty 9.85

## 2019-05-07 MED ORDER — DEXMEDETOMIDINE HCL IN NACL 400 MCG/100ML IV SOLN
0.1000 ug/kg/h | INTRAVENOUS | Status: DC
  Filled 2019-05-07: qty 100

## 2019-05-07 MED ORDER — POTASSIUM CHLORIDE 2 MEQ/ML IV SOLN
80.0000 meq | INTRAVENOUS | Status: DC
  Filled 2019-05-07: qty 40

## 2019-05-07 MED ORDER — VANCOMYCIN HCL 10 G IV SOLR
1500.0000 mg | INTRAVENOUS | Status: DC
  Filled 2019-05-07: qty 1500

## 2019-05-07 NOTE — Plan of Care (Signed)
  Problem: Nutrition: Goal: Adequate nutrition will be maintained Outcome: Completed/Met

## 2019-05-07 NOTE — Progress Notes (Signed)
Occupational Therapy Treatment Patient Details Name: [name] MRN: [id number] DOB: 10/13/1945 Today's Date: 05/07/2019    History of present illness 73 year old gentleman with chronic kidney disease baseline creatinine ~2.5), morbid obesity, RV dysfunction, L metatarsal amputation and blindness. On 2L O2 at baseline. Pt came to ED with c/o of SoB. Admitted 04/28/19 for treatment of acute on chronic CHF, AKI, AS, HTN, CAD and paroxysmal A fib.    OT comments  Pt progressing toward established goals. Pt currently requires setupA for eating and grooming while sitting, pt required modA for sit<>stand and assistance for posterior pericare as he is heavily reliant on BUE support on RW in standing. Pt completed UB bathing while seated in recliner. Vital signs within normal range throughout session. Pt will continue to benefit from skilled OT services to maximize safety and independence with ADL/IADL and functional mobility. Will continue to follow acutely and progress as tolerated.      Follow Up Recommendations  Home health OT    Equipment Recommendations  None recommended by OT    Recommendations for Other Services      Precautions / Restrictions Precautions Precautions: Fall Precaution Comments: hx of fall Restrictions Weight Bearing Restrictions: No       Mobility Bed Mobility               General bed mobility comments: sitting in recline upon arrival  Transfers Overall transfer level: Needs assistance Equipment used: Rolling walker (2 wheeled) Transfers: Sit to/from Stand Sit to Stand: Mod assist         General transfer comment: modA to powerup into standing    Balance Overall balance assessment: Needs assistance Sitting-balance support: Feet supported;No upper extremity supported Sitting balance-Leahy Scale: Fair Sitting balance - Comments: able to static sit without support   Standing balance support: Bilateral upper extremity supported Standing  balance-Leahy Scale: Poor Standing balance comment: requires UE support                           ADL either performed or assessed with clinical judgement   ADL Overall ADL's : At baseline Eating/Feeding: Set up;Sitting Eating/Feeding Details (indicate cue type and reason): pt eating grapes after setupA upon arrival Grooming: Wash/dry face;Set up   Upper Body Bathing: Set up;Sitting Upper Body Bathing Details (indicate cue type and reason): completed while sitting in recliner Lower Body Bathing: Moderate assistance;Sit to/from stand Lower Body Bathing Details (indicate cue type and reason): assistance to complete             Toileting- Clothing Manipulation and Hygiene: Moderate assistance;Sit to/from stand Toileting - Clothing Manipulation Details (indicate cue type and reason): assistance for posterior pericare;pt reliant on BUE support in standing     Functional mobility during ADLs: Moderate assistance;Min guard;Rolling walker General ADL Comments: modA to powerup to standing from recliner;minguard for safety while standing     Vision       Perception     Praxis      Cognition Arousal/Alertness: Awake/alert Behavior During Therapy: WFL for tasks assessed/performed Overall Cognitive Status: Within Functional Limits for tasks assessed                                 General Comments: pleasant and agreeable to therapy        Exercises     Shoulder Instructions       General Comments pt on  3lnc SpO2 >96% throughout session;HR within normal range    Pertinent Vitals/ Pain       Pain Assessment: No/denies pain  Home Living                                          Prior Functioning/Environment              Frequency  Min 2X/week        Progress Toward Goals  OT Goals(current goals can now be found in the care plan section)  Progress towards OT goals: Progressing toward goals  Acute Rehab OT  Goals Patient Stated Goal: go home OT Goal Formulation: With patient Time For Goal Achievement: 05/16/19 Potential to Achieve Goals: Good ADL Goals Pt Will Perform Grooming: with supervision Pt Will Perform Upper Body Dressing: with set-up Pt Will Perform Lower Body Dressing: with supervision;with adaptive equipment Pt Will Transfer to Toilet: with supervision;ambulating Additional ADL Goal #1: Pt will demonstrate independence with 3 energy conservation strategies during ADL completion.  Plan Discharge plan remains appropriate    Co-evaluation                 AM-PAC OT "6 Clicks" Daily Activity     Outcome Measure   Help from another person eating meals?: A Little Help from another person taking care of personal grooming?: A Little Help from another person toileting, which includes using toliet, bedpan, or urinal?: A Little Help from another person bathing (including washing, rinsing, drying)?: A Little Help from another person to put on and taking off regular upper body clothing?: A Little Help from another person to put on and taking off regular lower body clothing?: A Lot 6 Click Score: 17    End of Session Equipment Utilized During Treatment: Rolling walker;Oxygen  OT Visit Diagnosis: Other abnormalities of gait and mobility (R26.89);Muscle weakness (generalized) (M62.81);Low vision, both eyes (H54.2)   Activity Tolerance Patient tolerated treatment well   Patient Left in chair;with call bell/phone within reach   Nurse Communication Mobility status        Time: 8127-5170 OT Time Calculation (min): 30 min  Charges: OT General Charges $OT Visit: 1 Visit OT Treatments $Self Care/Home Management : 23-37 mins  [name] OTR/L Acute Rehabilitation Services Office: [place] 05/07/2019, 3:23 PM

## 2019-05-07 NOTE — Progress Notes (Signed)
Reported that patient has visual deficits and becomes disoriented easily. Request for daughter to be allowed to come down to pre-op area. This is allowed to assist with communication and patient safety.

## 2019-05-07 NOTE — Progress Notes (Signed)
Physical Therapy Treatment Patient Details Name: [name] MRN: [id number] DOB: [birth year] Today's Date: 05/07/2019    History of Present Illness 73 year old gentleman with chronic kidney disease baseline creatinine ~2.5), morbid obesity, RV dysfunction, L metatarsal amputation and blindness. On 2L O2 at baseline. Pt came to ED with c/o of SoB. Admitted 04/28/19 for treatment of acute on chronic CHF, AKI, AS, HTN, CAD and paroxysmal A fib.     PT Comments    Pt sound asleep in recliner on entry, requiring multiple attempts to waken and once awake minimally verbal. Agreeable to ambulation before eating lunch. Pt requires modA for initial power up to Rollator. Once up pt requires minA for management of Rollator to ambulate 100 feet in hallway before taking seated rest break due to 4/4 DoE. SaO2 >91%O2 on 3L O2. Min A for power up from Rollator to ambulate back to recliner to eat lunch. D/c plans will need to be reassessed after TAVR tomorrow. PT will continue to follow acutely.     Follow Up Recommendations  Home health PT;Supervision - Intermittent(through [facility])     Equipment Recommendations  None recommended by PT    Recommendations for Other Services OT consult     Precautions / Restrictions Precautions Precautions: Fall Precaution Comments: hx of fall Restrictions Weight Bearing Restrictions: No    Mobility  Bed Mobility               General bed mobility comments: sitting in recliner asleep upon arrival  Transfers Overall transfer level: Needs assistance Equipment used: 4-wheeled walker Transfers: Sit to/from Stand Sit to Stand: Mod assist;Min guard         General transfer comment: modA for power up to standing from recliner and min guard to power up from recliner after seated rest break  Ambulation/Gait Ambulation/Gait assistance: Min assist Gait Distance (Feet): 100 Feet(1x100 and 1x 20 ) Assistive device: 4-wheeled walker Gait Pattern/deviations:  Step-through pattern;Decreased step length - right;Decreased step length - left Gait velocity: slowed Gait velocity interpretation: <1.31 ft/sec, indicative of household ambulator General Gait Details: minA mainly for Rollator management due to visual impairment. requires increased verbal and tactile cues       Balance Overall balance assessment: Needs assistance Sitting-balance support: Feet supported;No upper extremity supported Sitting balance-Leahy Scale: Fair Sitting balance - Comments: able to static sit without support   Standing balance support: Bilateral upper extremity supported Standing balance-Leahy Scale: Poor Standing balance comment: requires UE support                            Cognition Arousal/Alertness: Awake/alert Behavior During Therapy: WFL for tasks assessed/performed Overall Cognitive Status: Within Functional Limits for tasks assessed                                 General Comments: pleasant and agreeable to therapy         General Comments General comments (skin integrity, edema, etc.): Pt on 3L O2 via [name], after ambulation of 100 feet 4/4 DoE however SaO2 91%O2       Pertinent Vitals/Pain Pain Assessment: No/denies pain           PT Goals (current goals can now be found in the care plan section) Acute Rehab PT Goals Patient Stated Goal: go home PT Goal Formulation: With patient Time For Goal Achievement: 05/15/19 Potential to Achieve Goals: Fair Progress towards  PT goals: Progressing toward goals    Frequency    Min 3X/week      PT Plan Current plan remains appropriate       AM-PAC PT "6 Clicks" Mobility   Outcome Measure  Help needed turning from your back to your side while in a flat bed without using bedrails?: None Help needed moving from lying on your back to sitting on the side of a flat bed without using bedrails?: A Little Help needed moving to and from a bed to a chair (including a  wheelchair)?: A Little Help needed standing up from a chair using your arms (e.g., wheelchair or bedside chair)?: A Lot Help needed to walk in hospital room?: A Little Help needed climbing 3-5 steps with a railing? : Total 6 Click Score: 16    End of Session         PT Visit Diagnosis: Other abnormalities of gait and mobility (R26.89);Muscle weakness (generalized) (M62.81)     Time: 1340-1405 PT Time Calculation (min) (ACUTE ONLY): 25 min  Charges:  $Gait Training: 23-37 mins                     Jaran Sainz B. Migdalia Dk PT, DPT Acute Rehabilitation Services Pager [phone] Office [phone]    [place] 05/07/2019, 4:12 PM

## 2019-05-07 NOTE — Progress Notes (Signed)
TCTS Subjective:  Feels ok, no shortness of breath. Sitting up talking with family on the phone.  Objective: Vital signs in last 24 hours: Temp:  [97.7 F (36.5 C)-97.8 F (36.6 C)] 97.8 F (36.6 C) (09/21 0505) Pulse Rate:  [57-85] 62 (09/21 1205) Cardiac Rhythm: Heart block;Bundle branch block (09/21 0700) Resp:  [19-22] 22 (09/21 1205) BP: (127-145)/(59-74) 137/66 (09/21 1205) SpO2:  [95 %-98 %] 95 % (09/21 1205) Weight:  [139.6 kg] 139.6 kg (09/21 0505)  Hemodynamic parameters for last 24 hours:    Intake/Output from previous day: 09/20 0701 - 09/21 0700 In: 600 [P.O.:600] Out: 800 [Urine:800] Intake/Output this shift: Total I/O In: 360 [P.O.:360] Out: -   General appearance: alert and cooperative Heart: regular rate and rhythm and systolic murmur RSB Lungs: clear to auscultation bilaterally  Lab Results: Recent Labs    05/07/19 0516 05/07/19 0932  WBC 14.2* 15.0*  HGB 10.9* 11.1*  HCT 36.5* 37.4*  PLT 205 221   BMET:  Recent Labs    05/07/19 0516 05/07/19 0932  NA 135 135  K 3.6 3.6  CL 90* 90*  CO2 31 34*  GLUCOSE 134* 247*  BUN 63* 64*  CREATININE 2.52* 2.55*  CALCIUM 9.0 9.0    PT/INR:  Recent Labs    05/07/19 0932  LABPROT 14.7  INR 1.2   ABG    Component Value Date/Time   PHART 7.433 05/07/2019 1035   HCO3 33.7 (H) 05/07/2019 1035   TCO2 29 04/28/2019 1452   ACIDBASEDEF 1.1 10/02/2013 0353   O2SAT 95.9 05/07/2019 1035   CBG (last 3)  Recent Labs    05/06/19 2109 05/07/19 0728 05/07/19 1158  GLUCAP 175* 122* 185*    Assessment/Plan:  Hemodynamically stable   Creat stable at 2.55  ABG shows PCO2 51 on 2LNC.  Plan TAVR tomorrow. He has no further questions.    LOS: 9 days    [name] 05/07/2019

## 2019-05-07 NOTE — Care Management Important Message (Signed)
Important Message  Patient Details  Name: [name] MRN: [id number] Date of Birth: 09/21/1945   Medicare Important Message Given:  Yes     Shelda Altes 05/07/2019, 12:28 PM

## 2019-05-07 NOTE — Progress Notes (Signed)
CARDIAC REHAB PHASE I   PRE:  Rate/Rhythm: 60 pacing    BP: sitting 142/65    SaO2: 88 RA, 94 2L  MODE:  Ambulation: 400 ft   POST:  Rate/Rhythm: 84 pacing    BP: sitting 137/66     SaO2: 93 3L  Pt had not walked all weekend. Stiff, needed assist x2 to stand. Walked with rollator (x1 assist to guide and x1 to stand by pt) and 3L O2. Became tired and SOB after 200 ft so sat and rested. Fatigued and SOB on return trip and had to sit and rest again. VSS better today but pt more tired. To recliner, helped change his brief as it was wet. PT in pm. [id number]   [place], ACSM 05/07/2019 11:54 AM

## 2019-05-07 NOTE — Progress Notes (Signed)
Patient ID: [name], male   DOB: 1945/09/25, 73 y.o.   MRN: [id number]     Advanced Heart Failure Rounding Note  PCP-Cardiologist: No primary care provider on file.   Subjective:    Weight down a bit again today.  Creatinine slightly lower at 2.5 yesterday, still pending today.   Comfortable, no dyspnea at rest but not very active.   Objective:   Weight Range: (!) 139.6 kg Body mass index is 41.74 kg/m.   Vital Signs:   Temp:  [97.7 F (36.5 C)-98.7 F (37.1 C)] 97.8 F (36.6 C) (09/21 0505) Pulse Rate:  [57-85] 62 (09/21 0932) Resp:  [19-21] 21 (09/21 0505) BP: (127-145)/(59-74) 145/70 (09/21 0929) SpO2:  [95 %-98 %] 95 % (09/21 0505) Weight:  [139.6 kg] 139.6 kg (09/21 0505) Last BM Date: 05/06/19  Weight change: Filed Weights   05/04/19 0539 05/05/19 0700 05/07/19 0505  Weight: (!) 141.6 kg (!) 139.7 kg (!) 139.6 kg    Intake/Output:   Intake/Output Summary (Last 24 hours) at 05/07/2019 1050 Last data filed at 05/07/2019 0900 Gross per 24 hour  Intake 720 ml  Output 800 ml  Net -80 ml      Physical Exam   General: NAD Neck: Thick, no JVD, no thyromegaly or thyroid nodule.  Lungs: Clear to auscultation bilaterally with normal respiratory effort. CV: Nonpalpable PMI.  Heart regular S1/S2, no S3/S4, 3/6 SEM with obscured S2.  1+ edema to knees bilaterally.   Abdomen: Soft, nontender, no hepatosplenomegaly, no distention.  Skin: Intact without lesions or rashes.  Neurologic: Alert and oriented x 3.  Psych: Normal affect. Extremities: No clubbing or cyanosis.  HEENT: Normal.    Telemetry   A-V sequential pacing alternating with NSR with 1st degree AVB (personally reviewed)  Labs    CBC Recent Labs    05/07/19 0516 05/07/19 0932  WBC 14.2* 15.0*  HGB 10.9* 11.1*  HCT 36.5* 37.4*  MCV 82.2 82.9  PLT 205 809   Basic Metabolic Panel Recent Labs    05/06/19 1226 05/07/19 0516  NA 133* 135  K 3.4* 3.6  CL 90* 90*  CO2 30 31  GLUCOSE  205* 134*  BUN 65* 63*  CREATININE 2.57* 2.52*  CALCIUM 9.0 9.0   Liver Function Tests No results for input(s): AST, ALT, ALKPHOS, BILITOT, PROT, ALBUMIN in the last 72 hours. No results for input(s): LIPASE, AMYLASE in the last 72 hours. Cardiac Enzymes No results for input(s): CKTOTAL, CKMB, CKMBINDEX, TROPONINI in the last 72 hours.  BNP: BNP (last 3 results) Recent Labs    08/31/18 1406 04/28/19 1011  BNP 151.0* 140.8*    ProBNP (last 3 results) No results for input(s): PROBNP in the last 8760 hours.   D-Dimer No results for input(s): DDIMER in the last 72 hours. Hemoglobin A1C Recent Labs    05/07/19 0932  HGBA1C 8.0*   Fasting Lipid Panel No results for input(s): CHOL, HDL, LDLCALC, TRIG, CHOLHDL, LDLDIRECT in the last 72 hours. Thyroid Function Tests No results for input(s): TSH, T4TOTAL, T3FREE, THYROIDAB in the last 72 hours.  Invalid input(s): FREET3  Other results:   Imaging    No results found.   Medications:     Scheduled Medications: . amLODipine  10 mg Oral Daily  . aspirin EC  81 mg Oral Daily  . chlorhexidine  30 mL Topical UD  . chlorhexidine  60 mL Topical Once   And  . [START ON 05/08/2019] chlorhexidine  60 mL Topical Once  . [  START ON 05/08/2019] chlorhexidine  15 mL Mouth/Throat Once  . cholecalciferol  1,000 Units Oral Daily  . citalopram  20 mg Oral Daily  . clopidogrel  75 mg Oral Daily  . famotidine  20 mg Oral Daily  . febuxostat  80 mg Oral Daily  . gabapentin  300 mg Oral BID  . heparin  5,000 Units Subcutaneous Q8H  . hydrALAZINE  75 mg Oral TID  . insulin aspart  0-20 Units Subcutaneous TID WC  . insulin glargine  70 Units Subcutaneous QHS  . mouth rinse  15 mL Mouth Rinse BID  . metoprolol succinate  50 mg Oral Daily  . rosuvastatin  20 mg Oral Daily  . senna-docusate  2 tablet Oral BID  . sodium chloride flush  3 mL Intravenous Q12H  . torsemide  120 mg Oral BID    Infusions: . sodium chloride    . [START  ON 05/08/2019] sodium chloride      PRN Medications: sodium chloride, acetaminophen, albuterol, antiseptic oral rinse, ondansetron (ZOFRAN) IV, polyethylene glycol, sodium chloride flush   Assessment/Plan   1. Acute on chronic diastolic CHF with prominent RV failure: Volume difficult to manage in setting of RV failure (OHS/OSA), CKD, and severe AS.  Echo this admission with EF 60-65%, RV reportedly normal, mod-severe aortic stenosis with mean gradient 30 mmHg. He was admitted with increased dyspnea (at rest).  Creatinine noted to be worse, suspect contrast Nephropathy from recent cath as well as cardiorenal. Volume status seems ok now, suspect at most mild overload. Creatinine mildly lower at 2.5 yesterday,  pending BMET today.   - Continue po torsemide today, with weight down today will not give metolazone yet.   2. Aortic stenosis:  Suspect this has progressed to severe AS based on the totality of recent studies.  He is a difficult TAVR candidate with OHS/OSA, significant diastolic failure, poor mobility, and CKD.  Currently scheduled for TAVR tomorrow (NPO at midnight). 3. Atrial fibrillation:  He appears to be in sinus with very low voltage Ps (regular rhythm) alternating with a-pacing.  He is not anticoagulated (takes ASA) due to prior intracerebral hemorrhage. 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    - Continue Toprol XL 50 mg daily.  4. CAD: Nonobstructive on 8/20 cath.  No chest pain. - Continue ASA and statin.   5. 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  6. AKI on CKD stage III: creatinine 1.97 prior, up to 2.78 at admission.  May have had contrast nephropathy from recent cath, also suspect cardiorenal syndrome.  Creatinine 2.53 ->2.39->2.63->2.57 ->2.7 -> 2.6 -> 2.5, pending BMET today.  - He may end up needing HD for volume management eventually, at baseline he is on very high diuretic doses.  7. OHS/OSA:  Continue nightly CPAP and home oxygen.  8. DM: Blood glucose improved with adjustment to insulin.  Length of Stay: 9  Loralie Champagne, MD  05/07/2019, 10:50 AM  Advanced Heart Failure Team Pager [phone] (M-F; 7a - 4p)  Please contact [facility] for night-coverage after hours (4p -7a ) and weekends on [email or web address]

## 2019-05-08 ENCOUNTER — Encounter (HOSPITAL_COMMUNITY): Payer: Self-pay

## 2019-05-08 ENCOUNTER — Inpatient Hospital Stay (HOSPITAL_COMMUNITY): Payer: Medicare (Managed Care) | Admitting: Certified Registered Nurse Anesthetist

## 2019-05-08 ENCOUNTER — Other Ambulatory Visit: Payer: Self-pay

## 2019-05-08 ENCOUNTER — Inpatient Hospital Stay (HOSPITAL_COMMUNITY): Payer: Medicare (Managed Care)

## 2019-05-08 ENCOUNTER — Inpatient Hospital Stay (HOSPITAL_COMMUNITY)
Admission: RE | Admit: 2019-05-08 | Payer: Medicare (Managed Care) | Source: Home / Self Care | Admitting: Cardiovascular Disease

## 2019-05-08 ENCOUNTER — Encounter (HOSPITAL_COMMUNITY)
Admission: EM | Disposition: A | Discharge status: Skilled Nursing Facility | Payer: Self-pay | Source: Home / Self Care | Attending: Cardiology

## 2019-05-08 DIAGNOSIS — I350 Nonrheumatic aortic (valve) stenosis: Secondary | ICD-10-CM

## 2019-05-08 DIAGNOSIS — Z006 Encounter for examination for normal comparison and control in clinical research program: Secondary | ICD-10-CM

## 2019-05-08 HISTORY — PX: TEE WITHOUT CARDIOVERSION: SHX5443

## 2019-05-08 HISTORY — PX: TRANSCATHETER AORTIC VALVE REPLACEMENT, TRANSFEMORAL: SHX6400

## 2019-05-08 LAB — POCT I-STAT, CHEM 8
BUN: 54 mg/dL — ABNORMAL HIGH (ref 8–23)
BUN: 56 mg/dL — ABNORMAL HIGH (ref 8–23)
BUN: 58 mg/dL — ABNORMAL HIGH (ref 8–23)
BUN: 58 mg/dL — ABNORMAL HIGH (ref 8–23)
Calcium, Ion: 1.22 mmol/L (ref 1.15–1.40)
Calcium, Ion: 1.22 mmol/L (ref 1.15–1.40)
Calcium, Ion: 1.21 mmol/L (ref 1.15–1.40)
Calcium, Ion: 1.22 mmol/L (ref 1.15–1.40)
Chloride: 93 mmol/L — ABNORMAL LOW (ref 98–111)
Chloride: 93 mmol/L — ABNORMAL LOW (ref 98–111)
Chloride: 94 mmol/L — ABNORMAL LOW (ref 98–111)
Chloride: 94 mmol/L — ABNORMAL LOW (ref 98–111)
Creatinine, Ser: 2.3 mg/dL — ABNORMAL HIGH (ref 0.61–1.24)
Creatinine, Ser: 2.1 mg/dL — ABNORMAL HIGH (ref 0.61–1.24)
Creatinine, Ser: 2.2 mg/dL — ABNORMAL HIGH (ref 0.61–1.24)
Creatinine, Ser: 2.3 mg/dL — ABNORMAL HIGH (ref 0.61–1.24)
Glucose, Bld: 89 mg/dL (ref 70–99)
Glucose, Bld: 91 mg/dL (ref 70–99)
Glucose, Bld: 100 mg/dL — ABNORMAL HIGH (ref 70–99)
Glucose, Bld: 101 mg/dL — ABNORMAL HIGH (ref 70–99)
HCT: 36 % — ABNORMAL LOW (ref 39.0–52.0)
HCT: 36 % — ABNORMAL LOW (ref 39.0–52.0)
HCT: 34 % — ABNORMAL LOW (ref 39.0–52.0)
HCT: 35 % — ABNORMAL LOW (ref 39.0–52.0)
Hemoglobin: 12.2 g/dL — ABNORMAL LOW (ref 13.0–17.0)
Hemoglobin: 12.2 g/dL — ABNORMAL LOW (ref 13.0–17.0)
Hemoglobin: 11.6 g/dL — ABNORMAL LOW (ref 13.0–17.0)
Hemoglobin: 11.9 g/dL — ABNORMAL LOW (ref 13.0–17.0)
Potassium: 3.3 mmol/L — ABNORMAL LOW (ref 3.5–5.1)
Potassium: 3.4 mmol/L — ABNORMAL LOW (ref 3.5–5.1)
Potassium: 3.5 mmol/L (ref 3.5–5.1)
Potassium: 3.7 mmol/L (ref 3.5–5.1)
Sodium: 139 mmol/L (ref 135–145)
Sodium: 139 mmol/L (ref 135–145)
Sodium: 139 mmol/L (ref 135–145)
Sodium: 138 mmol/L (ref 135–145)
TCO2: 35 mmol/L — ABNORMAL HIGH (ref 22–32)
TCO2: 35 mmol/L — ABNORMAL HIGH (ref 22–32)
TCO2: 37 mmol/L — ABNORMAL HIGH (ref 22–32)
TCO2: 34 mmol/L — ABNORMAL HIGH (ref 22–32)

## 2019-05-08 LAB — BASIC METABOLIC PANEL
Anion gap: 11 (ref 5–15)
BUN: 64 mg/dL — ABNORMAL HIGH (ref 8–23)
CO2: 33 mmol/L — ABNORMAL HIGH (ref 22–32)
Calcium: 8.6 mg/dL — ABNORMAL LOW (ref 8.9–10.3)
Chloride: 91 mmol/L — ABNORMAL LOW (ref 98–111)
Creatinine, Ser: 2.55 mg/dL — ABNORMAL HIGH (ref 0.61–1.24)
GFR calc Af Amer: 28 mL/min — ABNORMAL LOW (ref >60)
GFR calc non Af Amer: 24 mL/min — ABNORMAL LOW (ref >60)
Glucose, Bld: 118 mg/dL — ABNORMAL HIGH (ref 70–99)
Potassium: 3.4 mmol/L — ABNORMAL LOW (ref 3.5–5.1)
Sodium: 135 mmol/L (ref 135–145)

## 2019-05-08 LAB — GLUCOSE, CAPILLARY
Glucose-Capillary: 136 mg/dL — ABNORMAL HIGH (ref 70–99)
Glucose-Capillary: 58 mg/dL — ABNORMAL LOW (ref 70–99)
Glucose-Capillary: 103 mg/dL — ABNORMAL HIGH (ref 70–99)
Glucose-Capillary: 74 mg/dL (ref 70–99)
Glucose-Capillary: 155 mg/dL — ABNORMAL HIGH (ref 70–99)
Glucose-Capillary: 148 mg/dL — ABNORMAL HIGH (ref 70–99)

## 2019-05-08 LAB — CBC
HCT: 34.7 % — ABNORMAL LOW (ref 39.0–52.0)
Hemoglobin: 10.4 g/dL — ABNORMAL LOW (ref 13.0–17.0)
MCH: 24.5 pg — ABNORMAL LOW (ref 26.0–34.0)
MCHC: 30 g/dL (ref 30.0–36.0)
MCV: 81.8 fL (ref 80.0–100.0)
Platelets: 201 10*3/uL (ref 150–400)
RBC: 4.24 MIL/uL (ref 4.22–5.81)
RDW: 15.7 % — ABNORMAL HIGH (ref 11.5–15.5)
WBC: 14.1 10*3/uL — ABNORMAL HIGH (ref 4.0–10.5)
nRBC: 0 % (ref 0.0–0.2)

## 2019-05-08 LAB — POCT ACTIVATED CLOTTING TIME
Activated Clotting Time: 147 seconds
Activated Clotting Time: 279 seconds
Activated Clotting Time: 136 seconds

## 2019-05-08 SURGERY — IMPLANTATION, AORTIC VALVE, TRANSCATHETER, FEMORAL APPROACH
Anesthesia: Monitor Anesthesia Care

## 2019-05-08 MED ORDER — PROTAMINE SULFATE 10 MG/ML IV SOLN
INTRAVENOUS | Status: DC | PRN
  Administered 2019-05-08: 55 mg via INTRAVENOUS
  Administered 2019-05-08: 40 mg via INTRAVENOUS
  Administered 2019-05-08: 20 mg via INTRAVENOUS
  Administered 2019-05-08: 55 mg via INTRAVENOUS

## 2019-05-08 MED ORDER — DEXAMETHASONE SODIUM PHOSPHATE 10 MG/ML IJ SOLN
INTRAMUSCULAR | Status: DC | PRN
  Administered 2019-05-08: 4 mg via INTRAVENOUS

## 2019-05-08 MED ORDER — OXYCODONE HCL 5 MG PO TABS: 5.0000 mg | ORAL_TABLET | ORAL | Status: DC | PRN

## 2019-05-08 MED ORDER — SODIUM CHLORIDE 0.9% FLUSH
3.0000 mL | Freq: Two times a day (BID) | INTRAVENOUS | Status: DC
  Administered 2019-05-09 – 2019-05-11 (×6): 3 mL via INTRAVENOUS

## 2019-05-08 MED ORDER — LIDOCAINE HCL (PF) 1 % IJ SOLN
INTRAMUSCULAR | Status: DC | PRN
  Administered 2019-05-08: 20 mL

## 2019-05-08 MED ORDER — PHENYLEPHRINE 40 MCG/ML (10ML) SYRINGE FOR IV PUSH (FOR BLOOD PRESSURE SUPPORT)
PREFILLED_SYRINGE | INTRAVENOUS | Status: DC | PRN
  Administered 2019-05-08: 40 ug via INTRAVENOUS

## 2019-05-08 MED ORDER — MIDAZOLAM HCL 2 MG/2ML IJ SOLN
INTRAMUSCULAR | Status: AC
  Administered 2019-05-08: 1 mg via INTRAVENOUS
  Filled 2019-05-08: qty 2

## 2019-05-08 MED ORDER — HEPARIN SODIUM (PORCINE) 1000 UNIT/ML IJ SOLN
INTRAMUSCULAR | Status: DC | PRN
  Administered 2019-05-08: 17000 [IU] via INTRAVENOUS

## 2019-05-08 MED ORDER — FENTANYL CITRATE (PF) 100 MCG/2ML IJ SOLN
50.0000 ug | Freq: Once | INTRAMUSCULAR | Status: AC
  Administered 2019-05-08: 13:00:00 50 ug via INTRAVENOUS

## 2019-05-08 MED ORDER — SODIUM CHLORIDE 0.9 % IV SOLN: INTRAVENOUS | Status: AC

## 2019-05-08 MED ORDER — DEXTROSE 50 % IV SOLN
INTRAVENOUS | Status: AC
  Filled 2019-05-08: qty 50

## 2019-05-08 MED ORDER — VANCOMYCIN HCL 1000 MG IV SOLR
INTRAVENOUS | Status: DC | PRN
  Administered 2019-05-08: 1500 mg via INTRAVENOUS

## 2019-05-08 MED ORDER — TORSEMIDE 20 MG PO TABS
120.0000 mg | ORAL_TABLET | Freq: Two times a day (BID) | ORAL | Status: DC
  Administered 2019-05-09 – 2019-05-11 (×5): 120 mg via ORAL
  Filled 2019-05-08 (×5): qty 6

## 2019-05-08 MED ORDER — SODIUM CHLORIDE 0.9 % IV SOLN
1.5000 g | Freq: Two times a day (BID) | INTRAVENOUS | Status: AC
  Administered 2019-05-08 – 2019-05-10 (×4): 1.5 g via INTRAVENOUS
  Filled 2019-05-08 (×4): qty 1.5

## 2019-05-08 MED ORDER — HYDRALAZINE HCL 50 MG PO TABS
75.0000 mg | ORAL_TABLET | Freq: Three times a day (TID) | ORAL | Status: DC
  Administered 2019-05-09 – 2019-05-11 (×7): 75 mg via ORAL
  Filled 2019-05-08 (×7): qty 1

## 2019-05-08 MED ORDER — NOREPINEPHRINE BITARTRATE 1 MG/ML IV SOLN
INTRAVENOUS | Status: DC | PRN
  Administered 2019-05-08: 2 ug/min via INTRAVENOUS

## 2019-05-08 MED ORDER — HEPARIN (PORCINE) IN NACL 1000-0.9 UT/500ML-% IV SOLN
INTRAVENOUS | Status: DC | PRN
  Administered 2019-05-08: 500 mL

## 2019-05-08 MED ORDER — IOHEXOL 350 MG/ML SOLN
INTRAVENOUS | Status: DC | PRN
  Administered 2019-05-08: 42 mL

## 2019-05-08 MED ORDER — POTASSIUM CHLORIDE 10 MEQ/100ML IV SOLN
10.0000 meq | INTRAVENOUS | Status: AC
  Administered 2019-05-08 (×2): 10 meq via INTRAVENOUS
  Filled 2019-05-08: qty 100

## 2019-05-08 MED ORDER — PHENYLEPHRINE HCL-NACL 20-0.9 MG/250ML-% IV SOLN
0.0000 ug/min | INTRAVENOUS | Status: DC
  Filled 2019-05-08: qty 250

## 2019-05-08 MED ORDER — LACTATED RINGERS IV SOLN
INTRAVENOUS | Status: DC | PRN
  Administered 2019-05-08: 14:00:00 via INTRAVENOUS

## 2019-05-08 MED ORDER — DEXMEDETOMIDINE HCL IN NACL 400 MCG/100ML IV SOLN
INTRAVENOUS | Status: DC | PRN
  Administered 2019-05-08: 1 ug/kg/h via INTRAVENOUS

## 2019-05-08 MED ORDER — MIDAZOLAM HCL 2 MG/2ML IJ SOLN: 0.5000 mg | Freq: Once | INTRAMUSCULAR | Status: DC

## 2019-05-08 MED ORDER — MIDAZOLAM HCL 2 MG/2ML IJ SOLN
1.0000 mg | Freq: Once | INTRAMUSCULAR | Status: AC
  Administered 2019-05-08: 13:00:00 1 mg via INTRAVENOUS

## 2019-05-08 MED ORDER — MORPHINE SULFATE (PF) 2 MG/ML IV SOLN: 1.0000 mg | INTRAVENOUS | Status: DC | PRN

## 2019-05-08 MED ORDER — METOPROLOL TARTRATE 5 MG/5ML IV SOLN: 2.5000 mg | INTRAVENOUS | Status: DC | PRN

## 2019-05-08 MED ORDER — SODIUM CHLORIDE 0.9 % IV SOLN
INTRAVENOUS | Status: DC | PRN
  Administered 2019-05-08: 1.5 g via INTRAVENOUS

## 2019-05-08 MED ORDER — VANCOMYCIN HCL IN DEXTROSE 1-5 GM/200ML-% IV SOLN
1000.0000 mg | Freq: Once | INTRAVENOUS | Status: AC
  Administered 2019-05-08: 1000 mg via INTRAVENOUS
  Filled 2019-05-08: qty 200

## 2019-05-08 MED ORDER — PROPOFOL 500 MG/50ML IV EMUL
INTRAVENOUS | Status: DC | PRN
  Administered 2019-05-08: 25 ug/kg/min via INTRAVENOUS

## 2019-05-08 MED ORDER — FENTANYL CITRATE (PF) 100 MCG/2ML IJ SOLN
INTRAMUSCULAR | Status: AC
  Administered 2019-05-08: 50 ug via INTRAVENOUS
  Filled 2019-05-08: qty 2

## 2019-05-08 MED ORDER — NITROGLYCERIN IN D5W 200-5 MCG/ML-% IV SOLN: 0.0000 ug/min | INTRAVENOUS | Status: DC

## 2019-05-08 MED ORDER — EPHEDRINE SULFATE-NACL 50-0.9 MG/10ML-% IV SOSY
PREFILLED_SYRINGE | INTRAVENOUS | Status: DC | PRN
  Administered 2019-05-08: 5 mg via INTRAVENOUS

## 2019-05-08 MED ORDER — DEXTROSE 50 % IV SOLN
25.0000 g | INTRAVENOUS | Status: AC
  Administered 2019-05-08: 25 g via INTRAVENOUS

## 2019-05-08 MED ORDER — SODIUM CHLORIDE 0.9 % IV SOLN: INTRAVENOUS | Status: DC

## 2019-05-08 MED ORDER — SODIUM CHLORIDE 0.9% FLUSH: 3.0000 mL | INTRAVENOUS | Status: DC | PRN

## 2019-05-08 MED ORDER — TRAMADOL HCL 50 MG PO TABS: 50.0000 mg | ORAL_TABLET | ORAL | Status: DC | PRN

## 2019-05-08 MED ORDER — SODIUM CHLORIDE 0.9 % IV SOLN: 250.0000 mL | INTRAVENOUS | Status: DC | PRN

## 2019-05-08 MED ORDER — DEXTROSE 50 % IV SOLN
50.0000 mL | INTRAVENOUS | Status: AC
  Administered 2019-05-08: 50 mL via INTRAVENOUS

## 2019-05-08 SURGICAL SUPPLY — 34 items
BAG SNAP BAND KOVER 36X36 (MISCELLANEOUS) ×6 IMPLANT
BLANKET WARM UNDERBOD FULL ACC (MISCELLANEOUS) ×3 IMPLANT
CATH 29 EDWARDS DELIVERY SYS (CATHETERS) ×4 IMPLANT
CATH DIAG 6FR PIGTAIL ANGLED (CATHETERS) ×4 IMPLANT
CATH INFINITI 6F AL2 (CATHETERS) ×2 IMPLANT
CATH S G BIP PACING (CATHETERS) ×2 IMPLANT
CLOSURE MYNX CONTROL 6F/7F (Vascular Products) ×4 IMPLANT
CRIMPER (MISCELLANEOUS) ×2 IMPLANT
DEVICE CLOSURE PERCLS PRGLD 6F (VASCULAR PRODUCTS) IMPLANT
DEVICE INFLATION ATRION QL38 (MISCELLANEOUS) ×2 IMPLANT
GUIDEWIRE SAFE TJ AMPLATZ EXST (WIRE) ×2 IMPLANT
KIT HEART LEFT (KITS) ×3 IMPLANT
KIT MICROPUNCTURE NIT STIFF (SHEATH) ×2 IMPLANT
PACK CARDIAC CATHETERIZATION (CUSTOM PROCEDURE TRAY) ×3 IMPLANT
PERCLOSE PROGLIDE 6F (VASCULAR PRODUCTS) ×9
SHEATH 16X36 EDWARDS (SHEATH) ×2 IMPLANT
SHEATH BRITE TIP 7FR 35CM (SHEATH) ×2 IMPLANT
SHEATH PINNACLE 6F 10CM (SHEATH) ×2 IMPLANT
SHEATH PINNACLE 7F 10CM (SHEATH) ×2 IMPLANT
SHEATH PINNACLE 8F 10CM (SHEATH) ×2 IMPLANT
SHEATH PROBE COVER 6X72 (BAG) ×2 IMPLANT
SHIELD RADPAD SCOOP 12X17 (MISCELLANEOUS) ×2 IMPLANT
SLEEVE REPOSITIONING LENGTH 30 (MISCELLANEOUS) ×2 IMPLANT
STOPCOCK MORSE 400PSI 3WAY (MISCELLANEOUS) ×10 IMPLANT
SYR MEDRAD MARK V 150ML (SYRINGE) ×2 IMPLANT
TRANSDUCER W/STOPCOCK (MISCELLANEOUS) ×6 IMPLANT
TUBE CONN 8.8X1320 FR HP M-F (CONNECTOR) ×2 IMPLANT
TUBING ART PRESS 72  MALE/FEM (TUBING) ×2
TUBING ART PRESS 72 MALE/FEM (TUBING) IMPLANT
VALVE HEART TRANSCATH SZ3 29MM (Valve) ×2 IMPLANT
WIRE AMPLATZ SS-J .035X180CM (WIRE) ×2 IMPLANT
WIRE EMERALD 3MM-J .035X150CM (WIRE) ×2 IMPLANT
WIRE EMERALD 3MM-J .035X260CM (WIRE) ×2 IMPLANT
WIRE EMERALD ST .035X260CM (WIRE) ×2 IMPLANT

## 2019-05-08 NOTE — Progress Notes (Signed)
Patient ID: [name], male   DOB: 04/19/1946, 73 y.o.   MRN: [id number]     Advanced Heart Failure Rounding Note  PCP-Cardiologist: No primary care provider on file.   Subjective:    Weight down a 1 lb again.  Creatinine stable at 2.55.    Comfortable, no dyspnea at rest but not very active.   Objective:   Weight Range: (!) 138.9 kg Body mass index is 41.53 kg/m.   Vital Signs:   Temp:  [97.5 F (36.4 C)-98 F (36.7 C)] 98 F (36.7 C) (09/22 0622) Pulse Rate:  [57-65] 57 (09/22 0622) Resp:  [22] 22 (09/21 1205) BP: (116-145)/(57-70) 117/60 (09/22 0622) SpO2:  [92 %-95 %] 92 % (09/22 0622) Weight:  [138.9 kg] 138.9 kg (09/22 0622) Last BM Date: 05/06/19  Weight change: Filed Weights   05/05/19 0700 05/07/19 0505 05/08/19 0622  Weight: (!) 139.7 kg (!) 139.6 kg (!) 138.9 kg    Intake/Output:   Intake/Output Summary (Last 24 hours) at 05/08/2019 0809 Last data filed at 05/07/2019 2210 Gross per 24 hour  Intake 603 ml  Output -  Net 603 ml      Physical Exam   General: NAD Neck: Thick, no JVD, no thyromegaly or thyroid nodule.  Lungs: Clear to auscultation bilaterally with normal respiratory effort. CV: Nonpalpable PMI.  Heart regular S1/S2, no S3/S4, 3/6 SEM RUSB with obscured S2.  1+ chronic edema to knees.  Abdomen: Soft, nontender, no hepatosplenomegaly, no distention.  Skin: Intact without lesions or rashes.  Neurologic: Alert and oriented x 3.  Psych: Normal affect. Extremities: No clubbing or cyanosis.  HEENT: Normal.    Telemetry   A-V sequential pacing alternating with NSR with 1st degree AVB (personally reviewed)  Labs    CBC Recent Labs    05/07/19 0932 05/08/19 0352  WBC 15.0* 14.1*  HGB 11.1* 10.4*  HCT 37.4* 34.7*  MCV 82.9 81.8  PLT 221 485   Basic Metabolic Panel Recent Labs    05/07/19 0932 05/08/19 0352  NA 135 135  K 3.6 3.4*  CL 90* 91*  CO2 34* 33*  GLUCOSE 247* 118*  BUN 64* 64*  CREATININE 2.55* 2.55*   CALCIUM 9.0 8.6*   Liver Function Tests Recent Labs    05/07/19 0932  AST 22  ALT 23  ALKPHOS 76  BILITOT 0.6  PROT 7.5  ALBUMIN 2.8*   No results for input(s): LIPASE, AMYLASE in the last 72 hours. Cardiac Enzymes No results for input(s): CKTOTAL, CKMB, CKMBINDEX, TROPONINI in the last 72 hours.  BNP: BNP (last 3 results) Recent Labs    08/31/18 1406 04/28/19 1011 05/07/19 0932  BNP 151.0* 140.8* 115.7*    ProBNP (last 3 results) No results for input(s): PROBNP in the last 8760 hours.   D-Dimer No results for input(s): DDIMER in the last 72 hours. Hemoglobin A1C Recent Labs    05/07/19 0932  HGBA1C 8.0*   Fasting Lipid Panel No results for input(s): CHOL, HDL, LDLCALC, TRIG, CHOLHDL, LDLDIRECT in the last 72 hours. Thyroid Function Tests No results for input(s): TSH, T4TOTAL, T3FREE, THYROIDAB in the last 72 hours.  Invalid input(s): FREET3  Other results:   Imaging    No results found.   Medications:     Scheduled Medications: . dextrose      . amLODipine  10 mg Oral Daily  . aspirin EC  81 mg Oral Daily  . chlorhexidine  30 mL Topical UD  . chlorhexidine  60  mL Topical Once  . chlorhexidine  15 mL Mouth/Throat Once  . cholecalciferol  1,000 Units Oral Daily  . citalopram  20 mg Oral Daily  . clopidogrel  75 mg Oral Daily  . dextrose  50 mL Intravenous STAT  . famotidine  20 mg Oral Daily  . febuxostat  80 mg Oral Daily  . gabapentin  300 mg Oral BID  . heparin  5,000 Units Subcutaneous Q8H  . hydrALAZINE  75 mg Oral TID  . insulin aspart  0-20 Units Subcutaneous TID WC  . insulin glargine  70 Units Subcutaneous QHS  . mouth rinse  15 mL Mouth Rinse BID  . metoprolol succinate  50 mg Oral Daily  . mupirocin ointment  1 application Nasal BID  . rosuvastatin  20 mg Oral Daily  . senna-docusate  2 tablet Oral BID  . sodium chloride flush  3 mL Intravenous Q12H  . torsemide  120 mg Oral BID    Infusions: . sodium chloride    .  potassium chloride      PRN Medications: sodium chloride, acetaminophen, albuterol, antiseptic oral rinse, ondansetron (ZOFRAN) IV, polyethylene glycol, sodium chloride flush   Assessment/Plan   1. Acute on chronic diastolic CHF with prominent RV failure: Volume difficult to manage in setting of RV failure (OHS/OSA), CKD, and severe AS.  Echo this admission with EF 60-65%, RV reportedly normal, mod-severe aortic stenosis with mean gradient 30 mmHg. He was admitted with increased dyspnea (at rest).  Creatinine noted to be worse this admission, suspect contrast Nephropathy from recent cath as well as cardiorenal. Volume status seems ok now, suspect at most mild overload. Creatinine currently remaining stable, 2.55 today.  - Continue po torsemide today, with weight down again and going for TAVR will not give metolazone.   2. Aortic stenosis:  Suspect this has progressed to severe AS based on the totality of recent studies.  He is a difficult TAVR candidate with OHS/OSA, significant diastolic failure, poor mobility, and CKD.  However, he has been quite stable in the hospital awaiting TAVR.  - TAVR scheduled today.  3. Atrial fibrillation:  He appears to be in sinus with very low voltage Ps (regular rhythm) alternating with a-pacing.  He is not anticoagulated (takes ASA) due to prior intracerebral hemorrhage. 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    - Continue Toprol XL 50 mg daily.  4. CAD: Nonobstructive on 8/20 cath.  No chest pain. - Continue ASA and statin.   5. 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  6. AKI on CKD stage III: creatinine 1.97 prior, up to 2.78 at admission.  May have had contrast nephropathy from recent cath, also suspect cardiorenal syndrome.  Creatinine 2.53 ->2.39->2.63->2.57 ->2.7 -> 2.6 -> 2.5 -> 2.55 -> 2.55.  - He may end up needing HD for volume management eventually, at baseline he is on very high  diuretic doses.  7. OHS/OSA: Continue nightly CPAP and home oxygen.  8. DM: Blood glucose improved with adjustment to insulin.  Length of Stay: [place], MD  05/08/2019, 8:09 AM  Advanced Heart Failure Team Pager [phone] (M-F; 7a - 4p)  Please contact [facility] for night-coverage after hours (4p -7a ) and weekends on [email or web address]

## 2019-05-08 NOTE — Op Note (Signed)
[facility]   MULTIDISCIPLINARY HEART VALVE TEAM   TAVR OPERATIVE NOTE   Date of Procedure:  05/08/2019  Preoperative Diagnosis: Severe Aortic Stenosis   Postoperative Diagnosis: Same   Procedure:    Transcatheter Aortic Valve Replacement - Percutaneous Transfemoral Approach  Edwards Sapien 3 THV (size 29 mm, model # 9600TFX, serial # [id number])   Co-Surgeons:  Gaye Pollack, MD and [name], MD  Anesthesiologist:  Nolon Nations, MD  Echocardiographer:  Sanda Klein, MD  Pre-operative Echo Findings:  Severe aortic stenosis  Normal left ventricular systolic function  Post-operative Echo Findings:  No paravalvular leak  Unchanged/normal left ventricular systolic function  BRIEF CLINICAL NOTE AND INDICATIONS FOR SURGERY  73 yo male with multiple severe comorbid medical conditions, admitted with acute on chronic diastolic heart failure in the setting of stage 4 CKD. He has been followed as an outpatient and worked up for TAVR. We elected to proceed with TAVR during his hospital admission because of tenuous renal function and heart failure symptoms.   During the course of the patient's preoperative work up they have been evaluated comprehensively by a multidisciplinary team of specialists coordinated through the [facility] in the [facility].  They have been demonstrated to suffer from symptomatic severe aortic stenosis as noted above. The patient has been counseled extensively as to the relative risks and benefits of all options for the treatment of severe aortic stenosis including long term medical therapy, conventional surgery for aortic valve replacement, and transcatheter aortic valve replacement.  The patient has been independently evaluated in formal cardiac surgical consultation by Dr Cyndia Bent, who deemed the patient appropriate for TAVR. Based upon review of all of the patient's preoperative  diagnostic tests they are felt to be candidate for transcatheter aortic valve replacement using the transfemoral approach as an alternative to prohibitive risk conventional surgery.    Following the decision to proceed with transcatheter aortic valve replacement, a discussion has been held regarding what types of management strategies would be attempted intraoperatively in the event of life-threatening complications, including whether or not the patient would be considered a candidate for the use of cardiopulmonary bypass and/or conversion to open sternotomy for attempted surgical intervention.  The patient has been advised of a variety of complications that might develop peculiar to this approach including but not limited to risks of death, stroke, paravalvular leak, aortic dissection or other major vascular complications, aortic annulus rupture, device embolization, cardiac rupture or perforation, acute myocardial infarction, arrhythmia, heart block or bradycardia requiring permanent pacemaker placement, congestive heart failure, respiratory failure, renal failure, pneumonia, infection, other late complications related to structural valve deterioration or migration, or other complications that might ultimately cause a temporary or permanent loss of functional independence or other long term morbidity.  The patient provides full informed consent for the procedure as described and all questions were answered preoperatively.  DETAILS OF THE OPERATIVE PROCEDURE  PREPARATION:   The patient is brought to the operating room on the above mentioned date and central monitoring was established by the anesthesia team including placement of a central venous catheter and radial arterial line. The patient is placed in the supine position on the operating table.  Intravenous antibiotics are administered. The patient is monitored closely throughout the procedure under conscious sedation.  Baseline transthoracic  echocardiogram is performed. The patient's chest, abdomen, both groins, and both lower extremities are prepared and draped in a sterile manner. A time out procedure is  performed.   PERIPHERAL ACCESS:   Using ultrasound guidance, femoral arterial and venous access is obtained with placement of 6 Fr sheaths on the left side.  A pigtail diagnostic catheter was passed through the femoral arterial sheath under fluoroscopic guidance into the aortic root.  A temporary transvenous pacemaker catheter was passed through the femoral venous sheath under fluoroscopic guidance into the right ventricle.  The pacemaker was tested to ensure stable lead placement and pacemaker capture. Aortic root angiography was performed in order to determine the optimal angiographic angle for valve deployment.  TRANSFEMORAL ACCESS:  A micropuncture technique is used to access the right femoral artery under fluoroscopic and ultrasound guidance.  2 Perclose devices are deployed at 10' and 2' positions to 'PreClose' the femoral artery. An 8 French sheath is placed and then an Amplatz Superstiff wire is advanced through the sheath. This is changed out for a 16 French transfemoral E-Sheath after progressively dilating over the Superstiff wire.  An AL-2 catheter was used to direct a straight-tip exchange length wire across the native aortic valve into the left ventricle. This was exchanged out for a pigtail catheter and position was confirmed in the LV apex. Simultaneous LV and Ao pressures were recorded.  The pigtail catheter was exchanged for an Amplatz Extra-stiff wire in the LV apex.    BALLOON AORTIC VALVULOPLASTY:  Not performed  TRANSCATHETER HEART VALVE DEPLOYMENT:  An Edwards Sapien 3 transcatheter heart valve (size 29 mm) was prepared and crimped per manufacturer's guidelines, and the proper orientation of the valve is confirmed on the [employer] delivery system. The valve was advanced through the introducer sheath using  normal technique until in an appropriate position in the abdominal aorta beyond the sheath tip. The balloon was then retracted and using the fine-tuning wheel was centered on the valve. The valve was then advanced across the aortic arch using appropriate flexion of the catheter. The valve was carefully positioned across the aortic valve annulus. The Commander catheter was retracted using normal technique. Once final position of the valve has been confirmed by angiographic assessment, the valve is deployed while temporarily holding ventilation and during rapid ventricular pacing to maintain systolic blood pressure < 50 mmHg and pulse pressure < 10 mmHg. The balloon inflation is held for >3 seconds after reaching full deployment volume. Once the balloon has fully deflated the balloon is retracted into the ascending aorta and valve function is assessed using echocardiography. The patient's hemodynamic recovery following valve deployment is good.  The deployment balloon and guidewire are both removed. Echo demostrated acceptable post-procedural gradients, stable mitral valve function, and no aortic insufficiency.   PROCEDURE COMPLETION:  The sheath was removed and femoral artery closure is performed using the 2 previously deployed Perclose devices.  Protamine is administered once femoral arterial repair was complete. The site is clear with no evidence of bleeding or hematoma after the sutures are tightened. The temporary pacemaker and pigtail catheters are removed. Mynx closure The patient tolerated the procedure well and is transported to the surgical intensive care in stable condition. There were no immediate intraoperative complications. All sponge instrument and needle counts are verified correct at completion of the operation.   The patient received a total of 42 mL of intravenous contrast during the procedure.   [name], MD 05/08/2019 4:08 PM

## 2019-05-08 NOTE — Anesthesia Procedure Notes (Addendum)
Central Venous Catheter Insertion Performed by: Nolon Nations, MD, anesthesiologist Start/End9/22/2020 1:05 PM, 05/08/2019 1:15 PM Patient location: Pre-op. Preanesthetic checklist: patient identified, IV checked, site marked, risks and benefits discussed, surgical consent, monitors and equipment checked, pre-op evaluation, timeout performed and anesthesia consent Position: Trendelenburg Lidocaine 1% used for infiltration and patient sedated Hand hygiene performed  and maximum sterile barriers used  Catheter size: 8 Fr Total catheter length 16. Central line was placed.Double lumen Procedure performed using ultrasound guided technique. Ultrasound Notes:anatomy identified, needle tip was noted to be adjacent to the nerve/plexus identified, no ultrasound evidence of intravascular and/or intraneural injection and image(s) printed for medical record Attempts: 1 Following insertion, dressing applied and line sutured. Post procedure assessment: blood return through all ports, no air and free fluid flow  Patient tolerated the procedure well with no immediate complications.

## 2019-05-08 NOTE — Progress Notes (Signed)
@  2338 paged [place] on-call for attending regarding pt's IJ CVC oozing requiring 3 dressing changes in 4 hours. Page returned and verbal orders received to obtain AM labs early and then DC line. Also ordered to hold SQ heparin until AM. Will proceed with orders and continue to monitor.

## 2019-05-08 NOTE — Anesthesia Preprocedure Evaluation (Addendum)
Anesthesia Evaluation  Patient identified by MRN, date of birth, ID band Patient awake    Reviewed: Allergy & Precautions, H&P , NPO status , Patient's Chart, lab work & pertinent test results, reviewed documented beta blocker date and time   Airway Mallampati: IV  TM Distance: <3 FB Neck ROM: Full    Dental no notable dental hx. (+) Teeth Intact, Dental Advisory Given   Pulmonary shortness of breath, sleep apnea , COPD, former smoker,    Pulmonary exam normal breath sounds clear to auscultation       Cardiovascular hypertension, Pt. on medications and Pt. on home beta blockers + CAD and +CHF  Normal cardiovascular exam+ dysrhythmias + pacemaker + Valvular Problems/Murmurs AS  Rhythm:Regular Rate:Normal  Echo 04/28/2019  1. The left ventricle has normal systolic function with an ejection fraction of 60-65%. The cavity size was normal. There is severely increased left ventricular wall thickness. Left ventricular diastolic Doppler parameters are consistent with pseudonormalization. Elevated left atrial and left ventricular end-diastolic pressures No evidence of left ventricular regional wall motion abnormalities.  2. The right ventricle has normal systolic function. The cavity was normal. There is no increase in right ventricular wall thickness.  3. Left atrial size was moderately dilated.  4. The aortic valve is tricuspid. Moderate thickening of the aortic valve. Severe calcifcation of the aortic valve. Moderate-severe stenosis of the aortic valve.  5. The aorta is normal unless otherwise noted.  6. The inferior vena cava was dilated in size with >50% respiratory variability.   Echo 09/01/2018 Left ventricle: The cavity size was normal. Wall thickness was increased in a pattern of moderate LVH. Systolic function was normal. The estimated ejection fraction was in the range of 60% to 65%. Wall motion was normal; there were no regional wall  motion abnormalities. - Aortic valve: There was severe stenosis. Valve area (VTI): 1.32 cm^2. Valve area (Vmax): 1.38 cm^2. Valve area (Vmean): 1.23 cm^2. - Left atrium: The atrium was mildly dilated. - Right atrium: The atrium was moderately dilated.   Neuro/Psych    GI/Hepatic Neg liver ROS, GERD  ,  Endo/Other  diabetes  Renal/GU Renal diseaseCr 2.5     Musculoskeletal  (+) Arthritis ,   Abdominal (+) + obese,   Peds  Hematology Hgb 10.6   Anesthesia Other Findings   Reproductive/Obstetrics                             Lab Results  Component Value Date   CREATININE 2.55 (H) 05/08/2019   BUN 64 (H) 05/08/2019   NA 135 05/08/2019   K 3.4 (L) 05/08/2019   CL 91 (L) 05/08/2019   CO2 33 (H) 05/08/2019    Lab Results  Component Value Date   WBC 14.1 (H) 05/08/2019   HGB 10.4 (L) 05/08/2019   HCT 34.7 (L) 05/08/2019   MCV 81.8 05/08/2019   PLT 201 05/08/2019    Anesthesia Physical  Anesthesia Plan  ASA: IV  Anesthesia Plan: MAC   Post-op Pain Management:    Induction: Intravenous  PONV Risk Score and Plan: 1 and Treatment may vary due to age or medical condition, Ondansetron and Propofol infusion  Airway Management Planned:   Additional Equipment: Arterial line and CVP  Intra-op Plan:   Post-operative Plan:   Informed Consent: I have reviewed the patients History and Physical, chart, labs and discussed the procedure including the risks, benefits and alternatives for the proposed anesthesia with  the patient or authorized representative who has indicated his/her understanding and acceptance.     Dental advisory given  Plan Discussed with: CRNA  Anesthesia Plan Comments:        Anesthesia Quick Evaluation

## 2019-05-08 NOTE — Anesthesia Postprocedure Evaluation (Signed)
Anesthesia Post Note  Patient: [name]  Procedure(s) Performed: TRANSCATHETER AORTIC VALVE REPLACEMENT, TRANSFEMORAL (N/A ) TRANSESOPHAGEAL ECHOCARDIOGRAM (TEE) (N/A )     Patient location during evaluation: PACU Anesthesia Type: MAC Level of consciousness: awake and alert Pain management: pain level controlled Vital Signs Assessment: post-procedure vital signs reviewed and stable Respiratory status: spontaneous breathing Cardiovascular status: stable Anesthetic complications: no    Last Vitals:  Vitals:   05/08/19 1320 05/08/19 1619  BP:    Pulse: (!) 59   Resp: (!) 21   Temp:  (!) 36.4 C  SpO2: 98%     Last Pain:  Vitals:   05/08/19 1619  TempSrc: Temporal  PainSc: 0-No pain                 Nolon Nations

## 2019-05-08 NOTE — Plan of Care (Signed)
  Problem: Coping: Goal: Level of anxiety will decrease Outcome: Completed/Met   Problem: Elimination: Goal: Will not experience complications related to urinary retention Outcome: Completed/Met   Problem: Pain Managment: Goal: General experience of comfort will improve Outcome: Completed/Met

## 2019-05-08 NOTE — Progress Notes (Signed)
  Echocardiogram 2D Echocardiogram has been performed.  [name] 05/08/2019, 3:49 PM

## 2019-05-08 NOTE — Interval H&P Note (Signed)
History and Physical Interval Note:  05/08/2019 1:41 PM  [name]  has presented today for surgery, with the diagnosis of Severe Aortic Stenosis.  The various methods of treatment have been discussed with the patient and family. After consideration of risks, benefits and other options for treatment, the patient has consented to  Procedure(s): TRANSCATHETER AORTIC VALVE REPLACEMENT, TRANSFEMORAL (N/A) TRANSESOPHAGEAL ECHOCARDIOGRAM (TEE) (N/A) as a surgical intervention.  The patient's history has been reviewed, patient examined, no change in status, stable for surgery.  I have reviewed the patient's chart and labs.  Questions were answered to the patient's satisfaction.     [name]

## 2019-05-08 NOTE — Transfer of Care (Signed)
Immediate Anesthesia Transfer of Care Note  Patient: [name]  Procedure(s) Performed: TRANSCATHETER AORTIC VALVE REPLACEMENT, TRANSFEMORAL (N/A ) TRANSESOPHAGEAL ECHOCARDIOGRAM (TEE) (N/A )  Patient Location: Cath Lab  Anesthesia Type:MAC  Level of Consciousness: awake, alert  and oriented  Airway & Oxygen Therapy: Patient Spontanous Breathing and Patient connected to face mask oxygen  Post-op Assessment: Report given to RN and Post -op Vital signs reviewed and stable  Post vital signs: Reviewed and stable  Last Vitals:  Vitals Value Taken Time  BP 118/63 05/08/19 1616  Temp    Pulse 62 05/08/19 1618  Resp 15 05/08/19 1618  SpO2 94 % 05/08/19 1618  Vitals shown include unvalidated device data.  Last Pain:  Vitals:   05/08/19 1048  TempSrc:   PainSc: 0-No pain      Patients Stated Pain Goal: 0 (03/17/22 3612)  Complications: No apparent anesthesia complications

## 2019-05-08 NOTE — Progress Notes (Signed)
Pt CBG at 58 taken before 0800. Pt is asymptomatic. Dr. Aundra Dubin in the hallway and was updated. Pt ordered to give 1 amp of D50. D50 given as ordered. Will recheck his CBG in 15 min.

## 2019-05-08 NOTE — Progress Notes (Addendum)
CBG prior to transfer to Short Stay at 155 post 1 ampule  D50.  Pt CBG at 78 pre TAVR procedure. Ivette Loyal NP updated via Shea Evans. New order received for 1 amp of D50  To give IV STAT!  D50 givn as ordered. Will recheck CBG in 15 minutes.

## 2019-05-08 NOTE — Anesthesia Procedure Notes (Signed)
Procedure Name: MAC Date/Time: 05/08/2019 2:52 PM Performed by: Teressa Lower., CRNA Pre-anesthesia Checklist: Patient identified, Emergency Drugs available, Suction available, Patient being monitored and Timeout performed Patient Re-evaluated:Patient Re-evaluated prior to induction Oxygen Delivery Method: Circle system utilized Ventilation: Oral airway inserted - appropriate to patient size

## 2019-05-08 NOTE — Op Note (Signed)
[facility]   MULTIDISCIPLINARY HEART VALVE TEAM   TAVR OPERATIVE NOTE   Date of Procedure:  05/08/2019  Preoperative Diagnosis: Severe Aortic Stenosis   Postoperative Diagnosis: Same   Procedure:    Transcatheter Aortic Valve Replacement - Percutaneous Right Transfemoral Approach  Edwards Sapien 3 Ultra THV (size 29 mm, model # 9750TFX, serial # [id number])   Co-Surgeons:  Gaye Pollack, MD and [name], MD   Anesthesiologist:  Elio Forget, MD  Echocardiographer:  Jerilynn Mages. Croitoru, MD  Pre-operative Echo Findings:  Severe aortic stenosis  Normal left ventricular systolic function  Post-operative Echo Findings:  No paravalvular leak  Normal left ventricular systolic function   BRIEF CLINICAL NOTE AND INDICATIONS FOR SURGERY  This 73 year old gentleman has stage D, severe, symptomatic aortic stenosis with New York Heart Association class IV symptoms of exertional fatigue and shortness of breath, admitted with acute on chronic diastolic congestive heart failure and acute kidney injury following cardiac catheterization and CTA studies.  I have personally reviewed his 2D echocardiogram, cardiac catheterization, and CTA studies.  Echocardiogram shows severe calcification and restriction of all 3 aortic valve leaflets with a mean transvalvular gradient of 32 mmHg and a dimensionless index of 0.22 consistent with severe aortic stenosis.  Left ventricular ejection fraction is normal with severe LVH.  His cardiac catheterization shows nonobstructive coronary disease with a mean gradient of 32 mmHg across the aortic valve and moderate pulmonary hypertension with a PA pressure of 71/28 with a mean of 44 mmHg.  I think aortic valve replacement certainly has a potential to improve management of his congestive heart failure symptoms and his quality of life.  He would not be a candidate for open surgical aortic valve replacement but I think TAVR is a reasonable alternative  although his operative risk would be elevated due to his multiple severe comorbidities.  His gated cardiac CTA shows anatomy suitable for transcatheter aortic valve placement using a 29 mm Sapien 3 valve.  His abdominal pelvic CTA shows adequate pelvic vascular anatomy to allow transfemoral insertion.  The patient and his sister were counseled at length regarding treatment alternatives for management of severe symptomatic aortic stenosis. The risks and benefits of surgical intervention has been discussed in detail. Long-term prognosis with medical therapy was discussed. Alternative approaches such as conventional surgical aortic valve replacement, transcatheter aortic valve replacement, and palliative medical therapy were compared and contrasted at length. This discussion was placed in the context of the patient's own specific clinical presentation and past medical history. All of their questions been addressed.  He would like to proceed with transcatheter aortic valve replacement if we feel that he is a candidate for that procedure.    Following the decision to proceed with transcatheter aortic valve replacement, a discussion was held regarding what types of management strategies would be attempted intraoperatively in the event of life-threatening complications, including whether or not the patient would be considered a candidate for the use of cardiopulmonary bypass and/or conversion to open sternotomy for attempted surgical intervention.  He is not a candidate for emergent sternotomy to manage any intraoperative complications.    The patient has been advised of a variety of complications that might develop including but not limited to risks of death, stroke, paravalvular leak, aortic dissection or other major vascular complications, aortic annulus rupture, device embolization, cardiac rupture or perforation, mitral regurgitation, acute myocardial infarction, arrhythmia, heart block or bradycardia  requiring permanent pacemaker placement, congestive heart failure, respiratory failure, renal failure, pneumonia,  infection, other late complications related to structural valve deterioration or migration, or other complications that might ultimately cause a temporary or permanent loss of functional independence or other long term morbidity. The patient provides full informed consent for the procedure as described and all questions were answered.     DETAILS OF THE OPERATIVE PROCEDURE  PREPARATION:    The patient is brought to the operating room on the above mentioned date and appropriate monitoring was established by the anesthesia team. The patient is placed in the supine position on the operating table.  Intravenous antibiotics are administered. The patient is monitored closely throughout the procedure under conscious sedation.    Baseline transthoracic echocardiogram was performed. The patient's chest, abdomen, both groins, and both lower extremities are prepared and draped in a sterile manner. A time out procedure is performed.   PERIPHERAL ACCESS:    Using the modified Seldinger technique, femoral arterial and venous access was obtained with placement of 6 Fr sheaths on the left side.  A pigtail diagnostic catheter was passed through the left arterial sheath under fluoroscopic guidance into the aortic root.  A temporary transvenous pacemaker catheter was passed through the left femoral venous sheath under fluoroscopic guidance into the right ventricle.  The pacemaker was tested to ensure stable lead placement and pacemaker capture. Aortic root angiography was performed in order to determine the optimal angiographic angle for valve deployment.   TRANSFEMORAL ACCESS:   Percutaneous transfemoral access and sheath placement was performed using ultrasound guidance.  The right common femoral artery was cannulated using a micropuncture needle and appropriate location was verified using hand  injection angiogram.  A pair of Abbott Perclose percutaneous closure devices were placed and a 6 French sheath replaced into the femoral artery.  The patient was heparinized systemically and ACT verified > 250 seconds.    A 16 Fr transfemoral E-sheath was introduced into the right common femoral artery after progressively dilating over an Amplatz superstiff wire. An AL-2 catheter was used to direct a straight-tip exchange length wire across the native aortic valve into the left ventricle. This was exchanged out for a pigtail catheter and position was confirmed in the LV apex. Simultaneous LV and Ao pressures were recorded.  The pigtail catheter was exchanged for an Amplatz Extra-stiff wire in the LV apex.     BALLOON AORTIC VALVULOPLASTY:   Not performed    TRANSCATHETER HEART VALVE DEPLOYMENT:   An Edwards Sapien 3 Ultra transcatheter heart valve (size 29 mm, model #9750TFX, serial #[id number]) was prepared and crimped per manufacturer's guidelines, and the proper orientation of the valve is confirmed on the [employer] delivery system. The valve was advanced through the introducer sheath using normal technique until in an appropriate position in the abdominal aorta beyond the sheath tip. The balloon was then retracted and using the fine-tuning wheel was centered on the valve. The valve was then advanced across the aortic arch using appropriate flexion of the catheter. The valve was carefully positioned across the aortic valve annulus. The Commander catheter was retracted using normal technique. Once final position of the valve has been confirmed by angiographic assessment, the valve is deployed while temporarily holding ventilation and during rapid ventricular pacing to maintain systolic blood pressure < 50 mmHg and pulse pressure < 10 mmHg. The balloon inflation is held for >3 seconds after reaching full deployment volume. Once the balloon has fully deflated the balloon is retracted into the  ascending aorta and valve function is assessed using echocardiography. There  is felt to be no paravalvular leak and no central aortic insufficiency.  The patient's hemodynamic recovery following valve deployment is good.  The deployment balloon and guidewire are both removed.    PROCEDURE COMPLETION:   The sheath was removed and femoral artery closure performed.  Protamine was administered once femoral arterial repair was complete. The temporary pacemaker, pigtail catheters and femoral sheaths were removed with manual pressure used for hemostasis.  A Mynx femoral closure device was utilized following removal of the diagnostic sheath in the left femoral artery.  The patient tolerated the procedure well and is transported to the surgical intensive care in stable condition. There were no immediate intraoperative complications. All sponge instrument and needle counts are verified correct at completion of the operation.   No blood products were administered during the operation.  The patient received a total of 42 mL of intravenous contrast during the procedure.   Gaye Pollack, MD 05/08/2019

## 2019-05-08 NOTE — TOC Initial Note (Signed)
Transition of Care [facility]) - Initial/Assessment Note    Patient Details  Name: [name] MRN: [id number] Date of Birth: 1945/09/28  Transition of [facility]) CM/SW Contact:    Bethena Roys, RN Phone Number: 05/08/2019, 12:06 PM  Clinical Narrative: Pt presented for SOB-plan for TAVR 05-08-19. PTA from home alone-PTA lives in a single story apartment with a ramped entrance. Pt independent in the apartment uses a cane with ambulation and has DME RW. Pt has [place] Aide via [facility] that comes 2 hrs in the  morning and 3 hrs in evening for assistance. Pt currently goes to [facility] Mondays and Fridays. Current recommendations for home with [facility]. CM will monitor post TAVR for additional transition of care needs.    Expected Discharge Plan: [place] Barriers to Discharge: Continued Medical Work up   Expected Discharge Plan and Services Expected Discharge Plan: [place] In-house Referral: NA Discharge Planning Services: CM Consult Post Acute Care Choice: [place] arrangements for the past 2 months: Apartment(single story active with [facility])                    Prior Living Arrangements/Services Living arrangements for the past 2 months: Apartment(single story active with [facility]) Lives with:: Self Patient language and need for interpreter reviewed:: Yes Do you feel safe going back to the place where you live?: Yes      Need for Family Participation in Patient Care: Yes (Comment) Care giver support system in place?: Yes (comment) Current home services: Homehealth aide([facility]) Criminal Activity/Legal Involvement Pertinent to Current Situation/Hospitalization: No - Comment as needed  Activities of Daily Living Home Assistive Devices/Equipment: Cane (specify quad or straight) ADL Screening (condition at time of admission) Patient's cognitive ability adequate to safely complete daily activities?: Yes Is the patient deaf or have difficulty  hearing?: No Does the patient have difficulty seeing, even when wearing glasses/contacts?: Yes Does the patient have difficulty concentrating, remembering, or making decisions?: No Patient able to express need for assistance with ADLs?: Yes Does the patient have difficulty dressing or bathing?: Yes Independently performs ADLs?: No Communication: Independent Dressing (OT): Needs assistance Is this a change from baseline?: Pre-admission baseline Grooming: Independent with device (comment) Feeding: Independent with device (comment) Bathing: Needs assistance Is this a change from baseline?: Pre-admission baseline Toileting: Independent with device (comment) In/Out Bed: Independent with device (comment) Walks in Home: Independent with device (comment) Does the patient have difficulty walking or climbing stairs?: Yes Weakness of Legs: Both Weakness of Arms/Hands: None   Emotional Assessment Appearance:: Appears stated age     Orientation: : Oriented to Self, Oriented to Place, Oriented to  Time, Oriented to Situation Alcohol / Substance Use: Not Applicable Psych Involvement: No (comment)  Admission diagnosis:  AKI (acute kidney injury) ([place]) [N17.9] Acute on chronic congestive heart failure, unspecified heart failure type [facility]) [I50.9] Patient Active Problem List   Diagnosis Date Noted  . Acute diastolic CHF (congestive heart failure) ([place]) 04/28/2019  . Acute on chronic congestive heart failure ([place])   . Heart failure ([place]) 08/31/2018  . Paroxysmal atrial tachycardia ([place]) 05/12/2018  . Aortic valve stenosis, nonrheumatic 05/12/2018  . Diabetes mellitus type 2 in obese ([place]) 05/12/2018  . Chronic diastolic heart failure ([place]) 11/11/2017  . Hypernatremia 08/15/2017  . AKI (acute kidney injury) ([place]) 08/15/2017  . Acute lower UTI 08/15/2017  . Coarse tremors 08/15/2017  . Amiodarone pulmonary toxicity   . Cellulitis   . Acute  encephalopathy   . Acute pulmonary edema (HCC)   .  Acute respiratory failure with hypoxia ([place]) 06/13/2017  . SSS (sick sinus syndrome) ([place]) 05/19/2016  . Cough 08/20/2015  . Morbid obesity ([place]) 07/09/2015  . Cardiac mass 10/04/2013  . CVA (cerebrovascular accident due to intracerebral hemorrhage) ([place]) 10/02/2013  . CVA (cerebral infarction) 10/01/2013  . History of intracranial hemorrhage 10/01/2013  . ICH (intracerebral hemorrhage) ([place]) 10/01/2013  . Right knee pain 09/07/2013  . CKD (chronic kidney disease) stage 3, GFR 30-59 ml/min (HCC) 09/07/2013  . Orchitis 09/05/2013  . Unspecified protein-calorie malnutrition ([place]) 09/05/2013  . Sepsis ([place]) 08/27/2013  . Trichomonas infection 08/27/2013  . Epididymo-orchitis, acute 08/27/2013  . PAF (paroxysmal atrial fibrillation) ([place]) 06/24/2013  . OSA (obstructive sleep apnea) 05/04/2011  . Shortness of breath 03/17/2011  . Depression 10/23/2010  . Degenerative joint disease   . Cardiomyopathy, hypertensive ([place])   . Coronary artery disease involving native coronary artery of native heart without angina pectoris   . Pacemaker   . Incarcerated ventral hernia   . COPD (chronic obstructive pulmonary disease) ([place]) 11/28/2009  . Diabetes mellitus due to underlying condition, controlled, with stage 3 chronic kidney disease, with long-term current use of insulin ([place]) 07/20/2006  . Hypercholesterolemia 07/20/2006  . GOUT 07/20/2006  . Macular degeneration (senile) of retina 07/20/2006  . Essential hypertension 07/20/2006  . BENIGN PROSTATIC HYPERTROPHY, HX OF 07/20/2006   PCP:  [name], MD Pharmacy:   [facility] [place], [place] [address] [place] [address] Phone: [phone] Fax: [phone]     Social Determinants of Health (SDOH) Interventions    Readmission Risk Interventions Readmission Risk Prevention Plan 05/08/2019  [place] or Home Care Consult Complete  Social Work Consult for [place]  Planning/Counseling Complete  Medication Review Press photographer) Complete  Some recent data might be hidden

## 2019-05-08 NOTE — Progress Notes (Signed)
Pt has orders for 300cc NS over 1 hour then continuous at 75cc/hr in am pre procedure. Pharmacy called to clarify order. Per Pilar Plate [facility] -he called TAVR coordinator who stated to hold in am and they would discuss in am team meeting. Jessie Foot, RN

## 2019-05-08 NOTE — H&P (View-Only) (Signed)
Patient ID: [name], male   DOB: 1946/02/18, 73 y.o.   MRN: [id number]     Advanced Heart Failure Rounding Note  PCP-Cardiologist: No primary care provider on file.   Subjective:    Weight down a 1 lb again.  Creatinine stable at 2.55.    Comfortable, no dyspnea at rest but not very active.   Objective:   Weight Range: (!) 138.9 kg Body mass index is 41.53 kg/m.   Vital Signs:   Temp:  [97.5 F (36.4 C)-98 F (36.7 C)] 98 F (36.7 C) (09/22 0622) Pulse Rate:  [57-65] 57 (09/22 0622) Resp:  [22] 22 (09/21 1205) BP: (116-145)/(57-70) 117/60 (09/22 0622) SpO2:  [92 %-95 %] 92 % (09/22 0622) Weight:  [138.9 kg] 138.9 kg (09/22 0622) Last BM Date: 05/06/19  Weight change: Filed Weights   05/05/19 0700 05/07/19 0505 05/08/19 0622  Weight: (!) 139.7 kg (!) 139.6 kg (!) 138.9 kg    Intake/Output:   Intake/Output Summary (Last 24 hours) at 05/08/2019 0809 Last data filed at 05/07/2019 2210 Gross per 24 hour  Intake 603 ml  Output -  Net 603 ml      Physical Exam   General: NAD Neck: Thick, no JVD, no thyromegaly or thyroid nodule.  Lungs: Clear to auscultation bilaterally with normal respiratory effort. CV: Nonpalpable PMI.  Heart regular S1/S2, no S3/S4, 3/6 SEM RUSB with obscured S2.  1+ chronic edema to knees.  Abdomen: Soft, nontender, no hepatosplenomegaly, no distention.  Skin: Intact without lesions or rashes.  Neurologic: Alert and oriented x 3.  Psych: Normal affect. Extremities: No clubbing or cyanosis.  HEENT: Normal.    Telemetry   A-V sequential pacing alternating with NSR with 1st degree AVB (personally reviewed)  Labs    CBC Recent Labs    05/07/19 0932 05/08/19 0352  WBC 15.0* 14.1*  HGB 11.1* 10.4*  HCT 37.4* 34.7*  MCV 82.9 81.8  PLT 221 631   Basic Metabolic Panel Recent Labs    05/07/19 0932 05/08/19 0352  NA 135 135  K 3.6 3.4*  CL 90* 91*  CO2 34* 33*  GLUCOSE 247* 118*  BUN 64* 64*  CREATININE 2.55* 2.55*   CALCIUM 9.0 8.6*   Liver Function Tests Recent Labs    05/07/19 0932  AST 22  ALT 23  ALKPHOS 76  BILITOT 0.6  PROT 7.5  ALBUMIN 2.8*   No results for input(s): LIPASE, AMYLASE in the last 72 hours. Cardiac Enzymes No results for input(s): CKTOTAL, CKMB, CKMBINDEX, TROPONINI in the last 72 hours.  BNP: BNP (last 3 results) Recent Labs    08/31/18 1406 04/28/19 1011 05/07/19 0932  BNP 151.0* 140.8* 115.7*    ProBNP (last 3 results) No results for input(s): PROBNP in the last 8760 hours.   D-Dimer No results for input(s): DDIMER in the last 72 hours. Hemoglobin A1C Recent Labs    05/07/19 0932  HGBA1C 8.0*   Fasting Lipid Panel No results for input(s): CHOL, HDL, LDLCALC, TRIG, CHOLHDL, LDLDIRECT in the last 72 hours. Thyroid Function Tests No results for input(s): TSH, T4TOTAL, T3FREE, THYROIDAB in the last 72 hours.  Invalid input(s): FREET3  Other results:   Imaging    No results found.   Medications:     Scheduled Medications: . dextrose      . amLODipine  10 mg Oral Daily  . aspirin EC  81 mg Oral Daily  . chlorhexidine  30 mL Topical UD  . chlorhexidine  60  mL Topical Once  . chlorhexidine  15 mL Mouth/Throat Once  . cholecalciferol  1,000 Units Oral Daily  . citalopram  20 mg Oral Daily  . clopidogrel  75 mg Oral Daily  . dextrose  50 mL Intravenous STAT  . famotidine  20 mg Oral Daily  . febuxostat  80 mg Oral Daily  . gabapentin  300 mg Oral BID  . heparin  5,000 Units Subcutaneous Q8H  . hydrALAZINE  75 mg Oral TID  . insulin aspart  0-20 Units Subcutaneous TID WC  . insulin glargine  70 Units Subcutaneous QHS  . mouth rinse  15 mL Mouth Rinse BID  . metoprolol succinate  50 mg Oral Daily  . mupirocin ointment  1 application Nasal BID  . rosuvastatin  20 mg Oral Daily  . senna-docusate  2 tablet Oral BID  . sodium chloride flush  3 mL Intravenous Q12H  . torsemide  120 mg Oral BID    Infusions: . sodium chloride    .  potassium chloride      PRN Medications: sodium chloride, acetaminophen, albuterol, antiseptic oral rinse, ondansetron (ZOFRAN) IV, polyethylene glycol, sodium chloride flush   Assessment/Plan   1. Acute on chronic diastolic CHF with prominent RV failure: Volume difficult to manage in setting of RV failure (OHS/OSA), CKD, and severe AS.  Echo this admission with EF 60-65%, RV reportedly normal, mod-severe aortic stenosis with mean gradient 30 mmHg. He was admitted with increased dyspnea (at rest).  Creatinine noted to be worse this admission, suspect contrast Nephropathy from recent cath as well as cardiorenal. Volume status seems ok now, suspect at most mild overload. Creatinine currently remaining stable, 2.55 today.  - Continue po torsemide today, with weight down again and going for TAVR will not give metolazone.   2. Aortic stenosis:  Suspect this has progressed to severe AS based on the totality of recent studies.  He is a difficult TAVR candidate with OHS/OSA, significant diastolic failure, poor mobility, and CKD.  However, he has been quite stable in the hospital awaiting TAVR.  - TAVR scheduled today.  3. Atrial fibrillation:  He appears to be in sinus with very low voltage Ps (regular rhythm) alternating with a-pacing.  He is not anticoagulated (takes ASA) due to prior intracerebral hemorrhage. 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    - Continue Toprol XL 50 mg daily.  4. CAD: Nonobstructive on 8/20 cath.  No chest pain. - Continue ASA and statin.   5. 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  6. AKI on CKD stage III: creatinine 1.97 prior, up to 2.78 at admission.  May have had contrast nephropathy from recent cath, also suspect cardiorenal syndrome.  Creatinine 2.53 ->2.39->2.63->2.57 ->2.7 -> 2.6 -> 2.5 -> 2.55 -> 2.55.  - He may end up needing HD for volume management eventually, at baseline he is on very high  diuretic doses.  7. OHS/OSA: Continue nightly CPAP and home oxygen.  8. DM: Blood glucose improved with adjustment to insulin.  Length of Stay: [place], MD  05/08/2019, 8:09 AM  Advanced Heart Failure Team Pager [phone] (M-F; 7a - 4p)  Please contact [facility] for night-coverage after hours (4p -7a ) and weekends on [email or web address]

## 2019-05-09 ENCOUNTER — Inpatient Hospital Stay (HOSPITAL_COMMUNITY): Payer: Medicare (Managed Care)

## 2019-05-09 ENCOUNTER — Encounter (HOSPITAL_COMMUNITY): Payer: Self-pay | Admitting: Cardiovascular Disease

## 2019-05-09 ENCOUNTER — Other Ambulatory Visit: Payer: Self-pay | Admitting: Physician Assistant

## 2019-05-09 DIAGNOSIS — Z952 Presence of prosthetic heart valve: Secondary | ICD-10-CM

## 2019-05-09 LAB — CBC
HCT: 37 % — ABNORMAL LOW (ref 39.0–52.0)
Hemoglobin: 11 g/dL — ABNORMAL LOW (ref 13.0–17.0)
MCH: 24.8 pg — ABNORMAL LOW (ref 26.0–34.0)
MCHC: 29.7 g/dL — ABNORMAL LOW (ref 30.0–36.0)
MCV: 83.5 fL (ref 80.0–100.0)
Platelets: 192 10*3/uL (ref 150–400)
RBC: 4.43 MIL/uL (ref 4.22–5.81)
RDW: 15.7 % — ABNORMAL HIGH (ref 11.5–15.5)
WBC: 16.2 10*3/uL — ABNORMAL HIGH (ref 4.0–10.5)
nRBC: 0 % (ref 0.0–0.2)

## 2019-05-09 LAB — GLUCOSE, CAPILLARY
Glucose-Capillary: 185 mg/dL — ABNORMAL HIGH (ref 70–99)
Glucose-Capillary: 230 mg/dL — ABNORMAL HIGH (ref 70–99)
Glucose-Capillary: 264 mg/dL — ABNORMAL HIGH (ref 70–99)
Glucose-Capillary: 228 mg/dL — ABNORMAL HIGH (ref 70–99)

## 2019-05-09 LAB — BASIC METABOLIC PANEL
Anion gap: 12 (ref 5–15)
BUN: 57 mg/dL — ABNORMAL HIGH (ref 8–23)
CO2: 31 mmol/L (ref 22–32)
Calcium: 8.8 mg/dL — ABNORMAL LOW (ref 8.9–10.3)
Chloride: 94 mmol/L — ABNORMAL LOW (ref 98–111)
Creatinine, Ser: 2.33 mg/dL — ABNORMAL HIGH (ref 0.61–1.24)
GFR calc Af Amer: 31 mL/min — ABNORMAL LOW (ref >60)
GFR calc non Af Amer: 27 mL/min — ABNORMAL LOW (ref >60)
Glucose, Bld: 221 mg/dL — ABNORMAL HIGH (ref 70–99)
Potassium: 4.1 mmol/L (ref 3.5–5.1)
Sodium: 137 mmol/L (ref 135–145)

## 2019-05-09 LAB — MAGNESIUM: Magnesium: 2 mg/dL (ref 1.7–2.4)

## 2019-05-09 LAB — ECHOCARDIOGRAM COMPLETE
Height: 72 in
Weight: 4899.2 oz

## 2019-05-09 IMAGING — DX DG CHEST 2V
2 series · 2 of 2 positions shown · non-contrast
Comparison: PA and lateral chest x-ray October 12, 2016

CLINICAL DATA: Wheezing, shortness of breath, and mid chest
tightness for the past 3 months. History of cardiomyopathy, coronary
artery disease, COPD, and diabetes

EXAM:
CHEST  2 VIEW

[dg chest 2 view (1 of 2)]
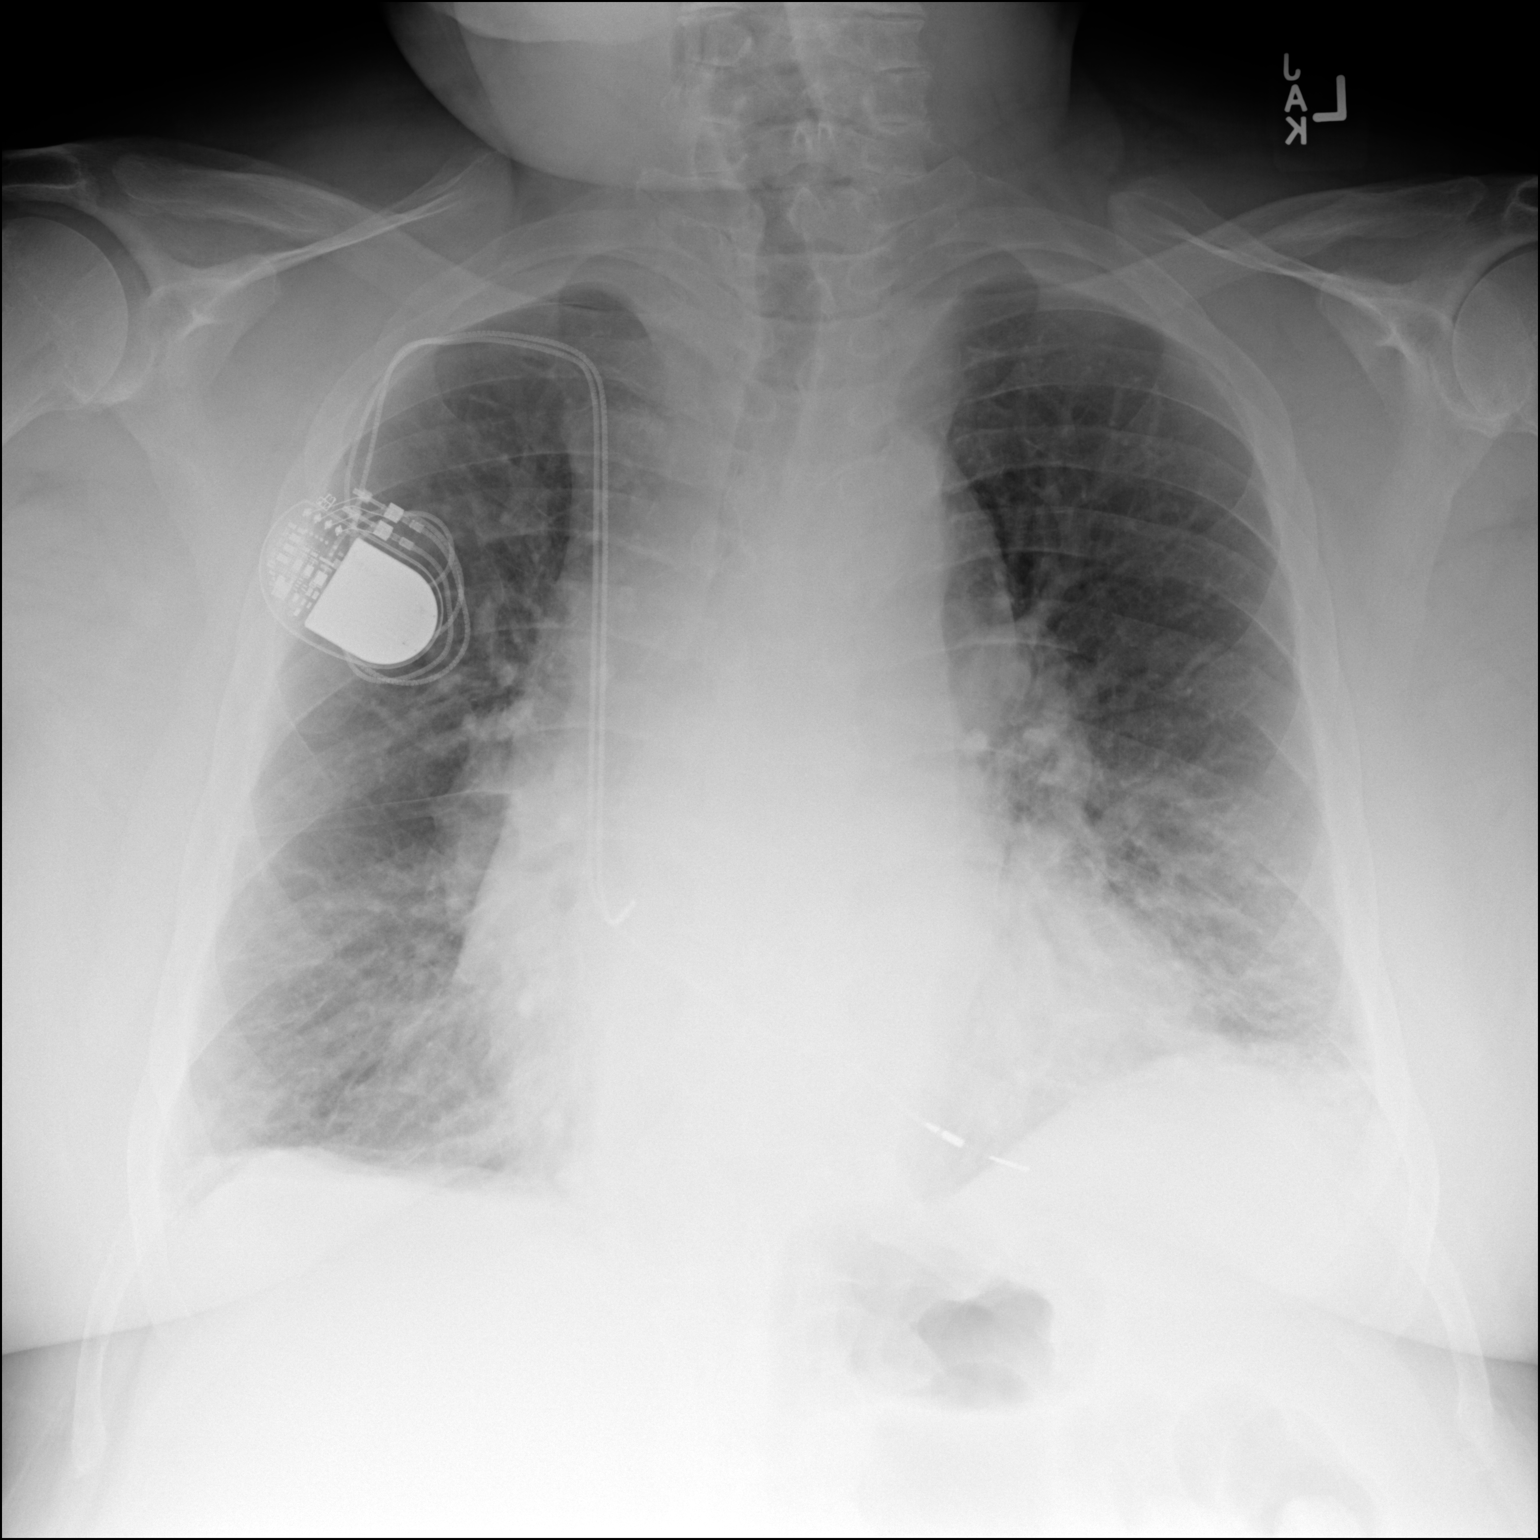

[dg chest 2 view (2 of 2)]
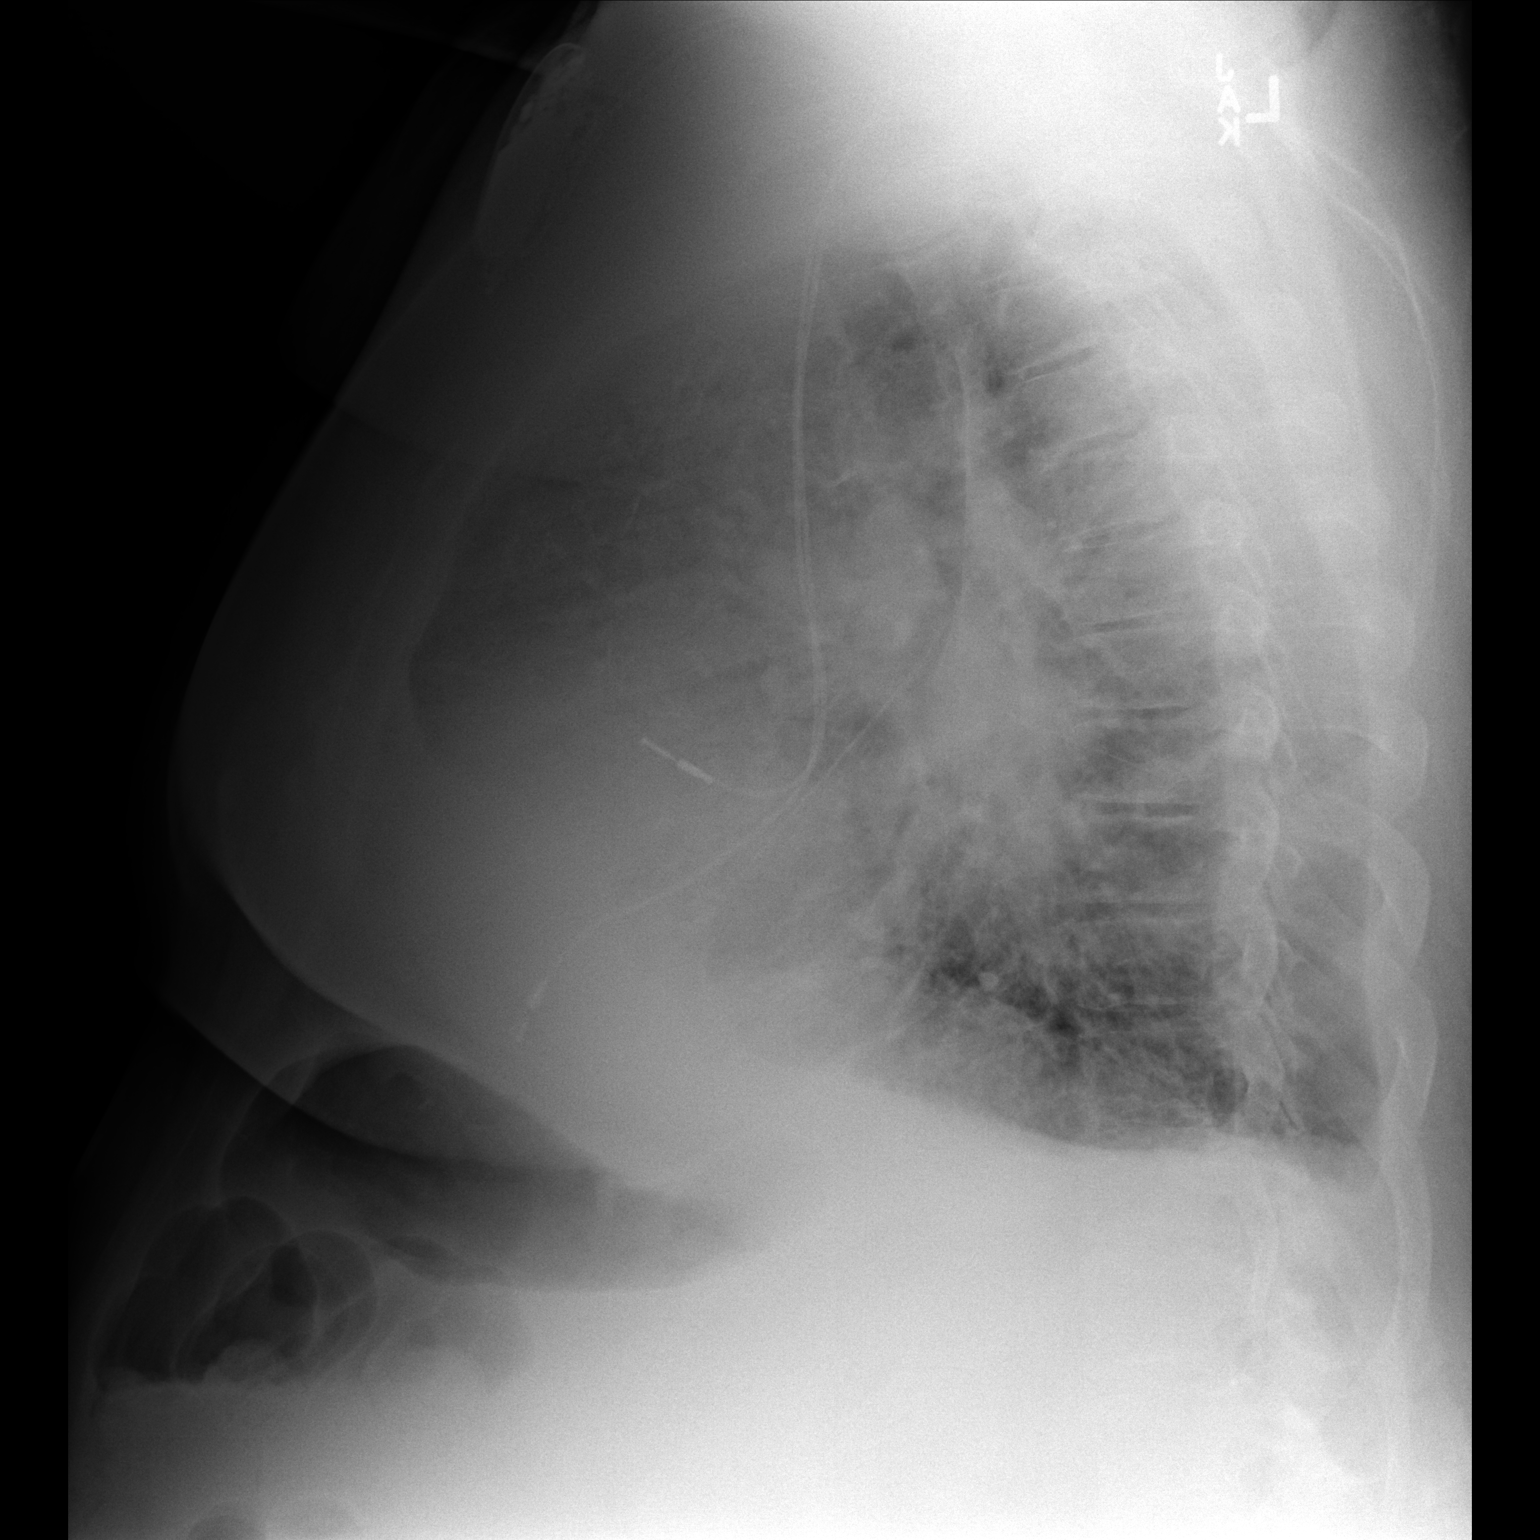

[2 of 2 positions shown; findings below may reference images not displayed]

FINDINGS: The lungs are well-expanded. The interstitial markings are more
prominent. The pulmonary vascularity is mildly engorged. The cardiac
silhouette remains enlarged. There is tortuosity of the descending
thoracic aorta with mural calcification. The ICD is in stable
position. A trace of pleural fluid is present bilaterally.
IMPRESSION: CHF with interstitial edema and tiny pleural effusions. No definite
pneumonia.

Thoracic aortic atherosclerosis.

## 2019-05-09 MED ORDER — METOLAZONE 5 MG PO TABS
2.5000 mg | ORAL_TABLET | Freq: Once | ORAL | Status: AC
  Administered 2019-05-09: 2.5 mg via ORAL
  Filled 2019-05-09: qty 1

## 2019-05-09 MED ORDER — POTASSIUM CHLORIDE CRYS ER 20 MEQ PO TBCR
20.0000 meq | EXTENDED_RELEASE_TABLET | Freq: Once | ORAL | Status: AC
  Administered 2019-05-09: 20 meq via ORAL
  Filled 2019-05-09: qty 1

## 2019-05-09 NOTE — Discharge Instructions (Signed)
ACTIVITY AND EXERCISE °• Daily activity and exercise are an important part of your recovery. People recover at different rates depending on their general health and type of valve procedure. °• Most people recovering from TAVR feel better relatively quickly  °• No lifting, pushing, pulling more than 10 pounds (examples to avoid: groceries, vacuuming, gardening, golfing): °            - For one week with a procedure through the groin. °            - For six weeks for procedures through the chest wall or neck. °NOTE: You will typically see one of our providers 7-14 days after your procedure to discuss WHEN TO RESUME the above activities.  °  °  °DRIVING °• Do not drive until you are seen for follow up and cleared by a provider. Generally, we ask patient to not drive for 1 week after their procedure. °• If you have been told by your doctor in the past that you may not drive, you must talk with him/her before you begin driving again. °  °DRESSING °• Groin site: you may leave the clear dressing over the site for up to one week or until it falls off. °  °HYGIENE °• If you had a femoral (leg) procedure, you may take a shower when you return home. After the shower, pat the site dry. Do NOT use powder, oils or lotions in your groin area until the site has completely healed. °• If you had a chest procedure, you may shower when you return home unless specifically instructed not to by your discharging practitioner. °            - DO NOT scrub incision; pat dry with a towel. °            - DO NOT apply any lotions, oils, powders to the incision. °            - No tub baths / swimming for at least 2 weeks. °• If you notice any fevers, chills, increased pain, swelling, bleeding or pus, please contact your doctor. °  °ADDITIONAL INFORMATION °• If you are going to have an upcoming dental procedure, please contact our office as you will require antibiotics ahead of time to prevent infection on your heart valve.  ° ° °If you have any  questions or concerns you can call the structural heart phone during normal business hours 8am-4pm. If you have an urgent need after hours or weekends please call [phone] to talk to the on call provider for general cardiology. If you have an emergency that requires immediate attention, please call 911.  ° ° °After TAVR Checklist ° °Check  Test Description  ° Follow up appointment in 1-2 weeks  You will see our structural heart physician assistant, [name]. Your incision sites will be checked and you will be cleared to drive and resume all normal activities if you are doing well.    ° 1 month echo and follow up  You will have an echo to check on your new heart valve and be seen back in the office by [name]. Many times the echo is not read by your appointment time, but Ryan will call you later that day or the following day to report your results.  ° Follow up with your primary cardiologist You will need to be seen by your primary cardiologist in the following 3-6 months after your 1 month appointment in the valve   clinic. Often times your Plavix or Aspirin will be discontinued during this time, but this is decided on a case by case basis.   ° 1 year echo and follow up You will have another echo to check on your heart valve after 1 year and be seen back in the office by [name]. This your last structural heart visit.  ° Bacterial endocarditis prophylaxis  You will have to take antibiotics for the rest of your life before all dental procedures (even teeth cleanings) to protect your heart valve. Antibiotics are also required before some surgeries. Please check with your cardiologist before scheduling any surgeries. Also, please make sure to tell us if you have a penicillin allergy as you will require an alternative antibiotic.   ° ° °

## 2019-05-09 NOTE — Progress Notes (Signed)
@  0050 L IJ CVC removed per verbal order without complication. Pressure held at site for 5 minutes and occlusive dressing placed. Pt instructed on importance of lying flat for 1 hour and frequent vitals initiated.

## 2019-05-09 NOTE — Progress Notes (Signed)
Physical Therapy Treatment Patient Details Name: [name] MRN: [id number] DOB: [birth year] Today's Date: 05/09/2019    History of Present Illness 73 year old gentleman with chronic kidney disease baseline creatinine ~2.5), morbid obesity, RV dysfunction, L metatarsal amputation and blindness. On 2L O2 at baseline. Pt came to ED with c/o of SoB. Admitted 04/28/19 for treatment of acute on chronic CHF, AKI, AS, HTN, CAD and paroxysmal A fib. Pt s/p TAVR proceduer 9/22.    PT Comments    Pt initially declined PT stating "I just had surgery and I'm tired." Pt agreeable with max encouragement. Pt requiring modA for bed mobility and sit to stand, minA for amb more so due to bilat eye blindness. Pt has aides through [facility] however would need 24/7 assist for safe d/c home at this time. May benefit from SNF program through [facility] until he can be home alone safely. PT to re-assess d/c recommendations tomorrow.  Follow Up Recommendations  SNF (will re-assess tomorrow)     Equipment Recommendations  None recommended by PT    Recommendations for Other Services OT consult     Precautions / Restrictions Precautions Precautions: Fall Precaution Comments: hx of fall Restrictions Weight Bearing Restrictions: No    Mobility  Bed Mobility Overal bed mobility: Needs Assistance Bed Mobility: Supine to Sit     Supine to sit: Mod assist     General bed mobility comments: max directional verbal and tactile cues, pt modA for trunk elevation to sit EOB  Transfers Overall transfer level: Needs assistance Equipment used: Rolling walker (2 wheeled) Transfers: Sit to/from Stand Sit to Stand: Mod assist         General transfer comment: modA to power up, minA to steady during transition of hands  Ambulation/Gait Ambulation/Gait assistance: Min assist Gait Distance (Feet): 65 Feet Assistive device: Rolling walker (2 wheeled) Gait Pattern/deviations: Step-through pattern;Decreased step length -  right;Decreased step length - left Gait velocity: slow Gait velocity interpretation: <1.31 ft/sec, indicative of household ambulator General Gait Details: minA due to pt being blind, pt with wide base of support and shortened step length   Stairs             Wheelchair Mobility    Modified Rankin (Stroke Patients Only)       Balance Overall balance assessment: Needs assistance Sitting-balance support: Feet supported;No upper extremity supported Sitting balance-Leahy Scale: Fair Sitting balance - Comments: able to static sit without support   Standing balance support: Bilateral upper extremity supported Standing balance-Leahy Scale: Poor Standing balance comment: requires UE support                            Cognition Arousal/Alertness: Awake/alert Behavior During Therapy: WFL for tasks assessed/performed Overall Cognitive Status: Within Functional Limits for tasks assessed                                        Exercises      General Comments General comments (skin integrity, edema, etc.): Pt on 3Lo2 via [place], SpO2 at >90%      Pertinent Vitals/Pain Pain Assessment: No/denies pain    Home Living                      Prior Function            PT Goals (current goals can now be  found in the care plan section) Progress towards PT goals: Not progressing toward goals - comment(mild regression due to TAVR)    Frequency    Min 3X/week      PT Plan Discharge plan needs to be updated    Co-evaluation              AM-PAC PT "6 Clicks" Mobility   Outcome Measure  Help needed turning from your back to your side while in a flat bed without using bedrails?: A Lot Help needed moving from lying on your back to sitting on the side of a flat bed without using bedrails?: A Lot Help needed moving to and from a bed to a chair (including a wheelchair)?: A Little Help needed standing up from a chair using your arms (e.g.,  wheelchair or bedside chair)?: A Lot Help needed to walk in hospital room?: A Little Help needed climbing 3-5 steps with a railing? : A Lot 6 Click Score: 14    End of Session Equipment Utilized During Treatment: Gait belt;Oxygen Activity Tolerance: Patient tolerated treatment well Patient left: in chair;with call bell/phone within reach;with chair alarm set Nurse Communication: Mobility status PT Visit Diagnosis: Other abnormalities of gait and mobility (R26.89);Muscle weakness (generalized) (M62.81)     Time: 3491-7915 PT Time Calculation (min) (ACUTE ONLY): 30 min  Charges:  $Gait Training: 8-22 mins $Therapeutic Activity: 8-22 mins                     Kittie Plater, PT, DPT Acute Rehabilitation Services Pager #: [phone] Office #: [phone]    Berline Lopes 05/09/2019, 1:33 PM

## 2019-05-09 NOTE — Progress Notes (Signed)
  [facility]   MULTIDISCIPLINARY HEART VALVE TEAM  Groin sites healing well s/p TAVR. Echo today showed EF 65-70% with normally functioning TAVR with mean gradient 13 mm Hg and no PVL. He is under the care of Dr. Aundra Dubin with hopeful DC home tomorrow. He has a previously scheduled apt with Dr.McLean next week. I will see him back next month for echo and follow up.   Angelena Form PA-C  [facility]

## 2019-05-09 NOTE — Progress Notes (Signed)
Patient ID: [name], male   DOB: July 03, 1946, 73 y.o.   MRN: [id number]     Advanced Heart Failure Rounding Note  PCP-Cardiologist: No primary care provider on file.   Subjective:    TAVR with Edwards Sapien 3 valve on 9/67, no complications.  Getting echo this morning (IVC looks dilated but collapses normally).   Creatinine lower today at 2.33 (2.55 pre-op).   Comfortable, no dyspnea at rest.    Objective:   Weight Range: (!) 138.9 kg Body mass index is 41.53 kg/m.   Vital Signs:   Temp:  [96.7 F (35.9 C)-98.4 F (36.9 C)] 98.4 F (36.9 C) (09/23 0820) Pulse Rate:  [0-263] 62 (09/23 0820) Resp:  [0-45] 17 (09/23 0820) BP: (95-146)/(52-85) 115/65 (09/23 0820) SpO2:  [0 %-99 %] 95 % (09/23 0500) Weight:  [138.9 kg] 138.9 kg (09/23 0607) Last BM Date: 05/06/19  Weight change: Filed Weights   05/07/19 0505 05/08/19 0622 05/09/19 0607  Weight: (!) 139.6 kg (!) 138.9 kg (!) 138.9 kg    Intake/Output:   Intake/Output Summary (Last 24 hours) at 05/09/2019 0939 Last data filed at 05/09/2019 0900 Gross per 24 hour  Intake 1624.17 ml  Output 1610 ml  Net 14.17 ml      Physical Exam   General: NAD Neck: Thick, JVP 9-10 cm, no thyromegaly or thyroid nodule.  Lungs: Clear to auscultation bilaterally with normal respiratory effort. CV: Nonpalpable PMI.  Heart regular S1/S2, no S3/S4, 2/6 SEM RUSB with clear S2.  Trace ankle edema.  Abdomen: Soft, nontender, no hepatosplenomegaly, no distention.  Skin: Intact without lesions or rashes.  Neurologic: Alert and oriented x 3.  Psych: Normal affect. Extremities: No clubbing or cyanosis.  HEENT: Normal.    Telemetry   A-V sequential pacing alternating with NSR with 1st degree AVB (personally reviewed)  Labs    CBC Recent Labs    05/08/19 0352  05/08/19 1638 05/09/19 0035  WBC 14.1*  --   --  16.2*  HGB 10.4*   < > 11.9* 11.0*  HCT 34.7*   < > 35.0* 37.0*  MCV 81.8  --   --  83.5  PLT 201  --   --  192   < > = values in this interval not displayed.   Basic Metabolic Panel Recent Labs    05/08/19 0352  05/08/19 1638 05/09/19 0035  NA 135   < > 138 137  K 3.4*   < > 3.7 4.1  CL 91*   < > 94* 94*  CO2 33*  --   --  31  GLUCOSE 118*   < > 101* 221*  BUN 64*   < > 58* 57*  CREATININE 2.55*   < > 2.30* 2.33*  CALCIUM 8.6*  --   --  8.8*  MG  --   --   --  2.0   < > = values in this interval not displayed.   Liver Function Tests Recent Labs    05/07/19 0932  AST 22  ALT 23  ALKPHOS 76  BILITOT 0.6  PROT 7.5  ALBUMIN 2.8*   No results for input(s): LIPASE, AMYLASE in the last 72 hours. Cardiac Enzymes No results for input(s): CKTOTAL, CKMB, CKMBINDEX, TROPONINI in the last 72 hours.  BNP: BNP (last 3 results) Recent Labs    08/31/18 1406 04/28/19 1011 05/07/19 0932  BNP 151.0* 140.8* 115.7*    ProBNP (last 3 results) No results for input(s): PROBNP in the last  8760 hours.   D-Dimer No results for input(s): DDIMER in the last 72 hours. Hemoglobin A1C Recent Labs    05/07/19 0932  HGBA1C 8.0*   Fasting Lipid Panel No results for input(s): CHOL, HDL, LDLCALC, TRIG, CHOLHDL, LDLDIRECT in the last 72 hours. Thyroid Function Tests No results for input(s): TSH, T4TOTAL, T3FREE, THYROIDAB in the last 72 hours.  Invalid input(s): FREET3  Other results:   Imaging    No results found.   Medications:     Scheduled Medications: . amLODipine  10 mg Oral Daily  . aspirin EC  81 mg Oral Daily  . cholecalciferol  1,000 Units Oral Daily  . citalopram  20 mg Oral Daily  . clopidogrel  75 mg Oral Daily  . famotidine  20 mg Oral Daily  . febuxostat  80 mg Oral Daily  . gabapentin  300 mg Oral BID  . heparin  5,000 Units Subcutaneous Q8H  . hydrALAZINE  75 mg Oral TID  . insulin aspart  0-20 Units Subcutaneous TID WC  . insulin glargine  70 Units Subcutaneous QHS  . mouth rinse  15 mL Mouth Rinse BID  . metolazone  2.5 mg Oral Once  . metoprolol succinate   50 mg Oral Daily  . mupirocin ointment  1 application Nasal BID  . rosuvastatin  20 mg Oral Daily  . senna-docusate  2 tablet Oral BID  . sodium chloride flush  3 mL Intravenous Q12H  . torsemide  120 mg Oral BID    Infusions: . sodium chloride    . cefUROXime (ZINACEF)  IV Stopped (05/08/19 2118)  . nitroGLYCERIN    . phenylephrine (NEO-SYNEPHRINE) Adult infusion      PRN Medications: sodium chloride, acetaminophen, albuterol, antiseptic oral rinse, metoprolol tartrate, morphine injection, ondansetron (ZOFRAN) IV, oxyCODONE, polyethylene glycol, sodium chloride flush, traMADol   Assessment/Plan   1. Acute on chronic diastolic CHF with prominent RV failure: Volume difficult to manage in setting of RV failure (OHS/OSA), CKD, and severe AS.  Echo this admission with EF 60-65%, RV reportedly normal, mod-severe aortic stenosis with mean gradient 30 mmHg. He was admitted with increased dyspnea (at rest).  Creatinine noted to be worse this admission, suspect contrast nephropathy from recent cath as well as cardiorenal. Now s/p TAVR on 9/22, creatinine lower at 2.33.  Mild volume overload confirmed by looking at IVC on echo.  - Continue po torsemide today but will give 1 dose of metolazone.  2. Aortic stenosis:  Successful TAVR with Edwards Sapien 3 valve on 9/22.   - Post-TAVR echo today.  - Continue ASA 81 + Plavix 75.  3. Atrial fibrillation:  He appears to be in sinus with very low voltage Ps (regular rhythm) alternating with a-pacing.  He is not anticoagulated (takes ASA) due to prior intracerebral hemorrhage. 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    - Continue Toprol XL 50 mg daily.  4. CAD: Nonobstructive on 8/20 cath.  No chest pain. - Continue ASA and statin.   5. 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  6. AKI on CKD stage III: creatinine 1.97 prior, up to 2.78 at admission.  May have had contrast nephropathy  from recent cath, also suspect cardiorenal syndrome.  Creatinine 2.33 today, a bit lower.  7. OHS/OSA: Continue nightly CPAP and home oxygen.  8. DM: Blood glucose improved with adjustment to insulin.  Watch today in hospital with gentle diuresis, possibly home tomorrow  if he does well.  Work with PT.   Length of Stay: 23  Loralie Champagne, MD  05/09/2019, 9:39 AM  Advanced Heart Failure Team Pager [phone] (M-F; 7a - 4p)  Please contact [facility] for night-coverage after hours (4p -7a ) and weekends on [email or web address]

## 2019-05-09 NOTE — Progress Notes (Addendum)
Patient previous left IJ site still oozing blood. Dressing changed x3 this shift. Paged on call cardiology. Orders to stop subQ heparin, apply SCDs and hold pressure to site again. Nursing will continue to monitor.

## 2019-05-09 NOTE — Progress Notes (Signed)
  Echocardiogram 2D Echocardiogram has been performed.  Aeson Sawyers G Trenell Moxey 05/09/2019, 10:00 AM

## 2019-05-10 LAB — PROTIME-INR
INR: 1.2 (ref 0.8–1.2)
Prothrombin Time: 15.3 seconds — ABNORMAL HIGH (ref 11.4–15.2)

## 2019-05-10 LAB — CBC
HCT: 32.2 % — ABNORMAL LOW (ref 39.0–52.0)
Hemoglobin: 9.8 g/dL — ABNORMAL LOW (ref 13.0–17.0)
MCH: 24.9 pg — ABNORMAL LOW (ref 26.0–34.0)
MCHC: 30.4 g/dL (ref 30.0–36.0)
MCV: 81.9 fL (ref 80.0–100.0)
Platelets: 160 10*3/uL (ref 150–400)
RBC: 3.93 MIL/uL — ABNORMAL LOW (ref 4.22–5.81)
RDW: 15.8 % — ABNORMAL HIGH (ref 11.5–15.5)
WBC: 16.8 10*3/uL — ABNORMAL HIGH (ref 4.0–10.5)
nRBC: 0 % (ref 0.0–0.2)

## 2019-05-10 LAB — BASIC METABOLIC PANEL
Anion gap: 12 (ref 5–15)
BUN: 71 mg/dL — ABNORMAL HIGH (ref 8–23)
CO2: 29 mmol/L (ref 22–32)
Calcium: 8.5 mg/dL — ABNORMAL LOW (ref 8.9–10.3)
Chloride: 93 mmol/L — ABNORMAL LOW (ref 98–111)
Creatinine, Ser: 2.69 mg/dL — ABNORMAL HIGH (ref 0.61–1.24)
GFR calc Af Amer: 26 mL/min — ABNORMAL LOW (ref >60)
GFR calc non Af Amer: 23 mL/min — ABNORMAL LOW (ref >60)
Glucose, Bld: 192 mg/dL — ABNORMAL HIGH (ref 70–99)
Potassium: 3.6 mmol/L (ref 3.5–5.1)
Sodium: 134 mmol/L — ABNORMAL LOW (ref 135–145)

## 2019-05-10 LAB — GLUCOSE, CAPILLARY
Glucose-Capillary: 157 mg/dL — ABNORMAL HIGH (ref 70–99)
Glucose-Capillary: 267 mg/dL — ABNORMAL HIGH (ref 70–99)
Glucose-Capillary: 288 mg/dL — ABNORMAL HIGH (ref 70–99)
Glucose-Capillary: 249 mg/dL — ABNORMAL HIGH (ref 70–99)

## 2019-05-10 NOTE — Progress Notes (Signed)
Patient ID: [name], male   DOB: [birth year], 73 y.o.   MRN: [id number]     Advanced Heart Failure Rounding Note  PCP-Cardiologist: No primary care provider on file.   Subjective:    TAVR with Edwards Sapien 3 valve on 5/63, no complications.  He has had some oozing from central line site left neck but now resolved.    Post-TAVR echo with EF 65-70%, moderate LVH, normal RV, bioprosthetic AoV s/p TAVR with mean gradient 13 mmHg.   Creatinine higher today, got metolazone yesterday.   Comfortable, no dyspnea at rest.    Objective:   Weight Range: (!) 142.9 kg Body mass index is 42.73 kg/m.   Vital Signs:   Temp:  [98 F (36.7 C)-98.3 F (36.8 C)] 98.2 F (36.8 C) (09/24 0337) Pulse Rate:  [55-62] 62 (09/24 0900) Resp:  [20-25] 20 (09/24 0900) BP: (138-149)/(67-78) 140/72 (09/24 0900) SpO2:  [91 %-96 %] 95 % (09/24 0900) Weight:  [142.9 kg] 142.9 kg (09/24 0555) Last BM Date: 05/09/19  Weight change: Filed Weights   05/08/19 0622 05/09/19 0607 05/10/19 0555  Weight: (!) 138.9 kg (!) 138.9 kg (!) 142.9 kg    Intake/Output:   Intake/Output Summary (Last 24 hours) at 05/10/2019 1112 Last data filed at 05/10/2019 0600 Gross per 24 hour  Intake 560 ml  Output 925 ml  Net -365 ml      Physical Exam   General: NAD Neck: Thick, no JVD, no thyromegaly or thyroid nodule.  Lungs: Clear to auscultation bilaterally with normal respiratory effort. CV: Nonpalpable PMI.  Heart regular S1/S2, no S3/S4, 2/6 SEM RUSB with clear S2.  1+ edema to knees.    Abdomen: Soft, nontender, no hepatosplenomegaly, no distention.  Skin: Intact without lesions or rashes.  Neurologic: Alert and oriented x 3.  Psych: Normal affect. Extremities: No clubbing or cyanosis.  HEENT: Normal.    Telemetry   A-V sequential pacing alternating with NSR with 1st degree AVB (personally reviewed)  Labs    CBC Recent Labs    05/09/19 0035 05/10/19 0204  WBC 16.2* 16.8*  HGB 11.0* 9.8*   HCT 37.0* 32.2*  MCV 83.5 81.9  PLT 192 149   Basic Metabolic Panel Recent Labs    05/09/19 0035 05/10/19 0204  NA 137 134*  K 4.1 3.6  CL 94* 93*  CO2 31 29  GLUCOSE 221* 192*  BUN 57* 71*  CREATININE 2.33* 2.69*  CALCIUM 8.8* 8.5*  MG 2.0  --    Liver Function Tests No results for input(s): AST, ALT, ALKPHOS, BILITOT, PROT, ALBUMIN in the last 72 hours. No results for input(s): LIPASE, AMYLASE in the last 72 hours. Cardiac Enzymes No results for input(s): CKTOTAL, CKMB, CKMBINDEX, TROPONINI in the last 72 hours.  BNP: BNP (last 3 results) Recent Labs    08/31/18 1406 04/28/19 1011 05/07/19 0932  BNP 151.0* 140.8* 115.7*    ProBNP (last 3 results) No results for input(s): PROBNP in the last 8760 hours.   D-Dimer No results for input(s): DDIMER in the last 72 hours. Hemoglobin A1C No results for input(s): HGBA1C in the last 72 hours. Fasting Lipid Panel No results for input(s): CHOL, HDL, LDLCALC, TRIG, CHOLHDL, LDLDIRECT in the last 72 hours. Thyroid Function Tests No results for input(s): TSH, T4TOTAL, T3FREE, THYROIDAB in the last 72 hours.  Invalid input(s): FREET3  Other results:   Imaging    No results found.   Medications:     Scheduled Medications: . amLODipine  10 mg Oral Daily  . aspirin EC  81 mg Oral Daily  . cholecalciferol  1,000 Units Oral Daily  . citalopram  20 mg Oral Daily  . clopidogrel  75 mg Oral Daily  . famotidine  20 mg Oral Daily  . febuxostat  80 mg Oral Daily  . gabapentin  300 mg Oral BID  . hydrALAZINE  75 mg Oral TID  . insulin aspart  0-20 Units Subcutaneous TID WC  . insulin glargine  70 Units Subcutaneous QHS  . mouth rinse  15 mL Mouth Rinse BID  . metoprolol succinate  50 mg Oral Daily  . mupirocin ointment  1 application Nasal BID  . rosuvastatin  20 mg Oral Daily  . senna-docusate  2 tablet Oral BID  . sodium chloride flush  3 mL Intravenous Q12H  . torsemide  120 mg Oral BID    Infusions: .  sodium chloride    . nitroGLYCERIN    . phenylephrine (NEO-SYNEPHRINE) Adult infusion      PRN Medications: sodium chloride, acetaminophen, albuterol, antiseptic oral rinse, metoprolol tartrate, morphine injection, ondansetron (ZOFRAN) IV, oxyCODONE, polyethylene glycol, sodium chloride flush, traMADol   Assessment/Plan   1. Acute on chronic diastolic CHF with prominent RV failure: Volume difficult to manage in setting of RV failure (OHS/OSA), CKD, and severe AS.  Echo this admission with EF 60-65%, RV reportedly normal, mod-severe aortic stenosis with mean gradient 30 mmHg. He was admitted with increased dyspnea (at rest).  Creatinine noted to be worse this admission, suspect contrast nephropathy from recent cath as well as cardiorenal. Now s/p TAVR on 9/22.  Volume status looks ok on exam today. - Continue po torsemide today, no metolazone.  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  I will arrange for PYP scan and send myeloma panel.  2. Aortic stenosis:  Successful TAVR with Edwards Sapien 3 valve on 9/22.  Post-TAVR echo stable with mean gradient 13 mmHg, no AI.  - Continue ASA 81 + Plavix 75.  3. Atrial fibrillation:  He appears to be in sinus with very low voltage Ps (regular rhythm) alternating with a-pacing.  He is not anticoagulated (takes ASA) due to prior intracerebral hemorrhage. 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    - Continue Toprol XL 50 mg daily.  4. CAD: Nonobstructive on 8/20 cath.  No chest pain. - Continue ASA and statin.   5. 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  6. AKI on CKD stage III: creatinine 1.97 prior, up to 2.78 at admission.  May have had contrast nephropathy from recent cath, also suspect cardiorenal syndrome.  Creatinine higher today after metolazone yesterday, no metolazone today.   7. OHS/OSA: Continue nightly CPAP and home oxygen.  8. DM:  Blood glucose improved with adjustment to insulin.  He is ready to leave hospital, need to figure out if he can go straight home or needs SNF.  Need PT to see today.  At discharge, cardiac med regimen: ASA 81, Plavix 75, Toprol XL 50 daily, amlodipine 10, hydralazine 75 tid, torsemide 120 mg bid, Crestor 20, metolazone 2.5 twice a week Tuesdays/Fridays.   Length of Stay: 64  Loralie Champagne, MD  05/10/2019, 11:12 AM  Advanced Heart Failure Team Pager [phone] (M-F; [place])  Please contact [facility] for night-coverage after hours (4p -7a ) and weekends on [email or web address]

## 2019-05-10 NOTE — Progress Notes (Signed)
Orthopedic Tech Progress Note Patient Details:  [name] 1946/02/23 [id number]  Ortho Devices Type of Ortho Device: Ace wrap, Unna boot Ortho Device/Splint Location: Bilateral unna Ortho Device/Splint Interventions: Application   Post Interventions Patient Tolerated: Well Instructions Provided: Care of device   [place] Pink 05/10/2019, 3:37 PM

## 2019-05-10 NOTE — TOC Progression Note (Signed)
Transition of Care (TOC) - Progression Note  Marvetta Gibbons RN, BSN Transitions of Care Unit 4E- RN Case Manager [phone]   Patient Details  Name: [name] MRN: [id number] Date of Birth: 1945-09-06  Transition of Care [facility]) CM/SW Contact  Dahlia Client, Romeo Rabon, RN Phone Number: 05/10/2019, 3:32 PM  Clinical Narrative:    Pt s/p TAVR, per PT eval post op, recommendations for SNF, call made to [place] to discuss SNF option. Per Lelon Frohlich- pt has been at [facility] (approved by [place] for LT) however pt left AMA from [facility] and wanted to return home with [facility]- he has 35 hr./week assistance through [facility] program at home. Webb Silversmith is going to check on SNF option for pt through [facility], this writer spoke with pt at bedside about recommendations and safe discharge- pt agreeable to STSNF placement- states "he is not happy about it but will go". Will await word from [facility] about SNF plans.     Expected Discharge Plan: [place] Barriers to Discharge: Continued Medical Work up  Expected Discharge Plan and Services Expected Discharge Plan: [place] In-house Referral: NA Discharge Planning Services: CM Consult Post Acute Care Choice: [place] Living arrangements for the past 2 months: Apartment(single story active with [facility])                                       Social Determinants of Health (SDOH) Interventions    Readmission Risk Interventions Readmission Risk Prevention Plan 05/08/2019  [place] or Home Care Consult Complete  Social Work Consult for [place] Planning/Counseling Complete  Medication Review Press photographer) Complete  Some recent data might be hidden

## 2019-05-10 NOTE — Care Management Important Message (Signed)
Important Message  Patient Details  Name: [name] MRN: [id number] Date of Birth: Jul 12, 1946   Medicare Important Message Given:  Yes     Shelda Altes 05/10/2019, 3:16 PM

## 2019-05-10 NOTE — NC FL2 (Signed)
[place] LEVEL OF CARE SCREENING TOOL     IDENTIFICATION  Patient Name: [name] Birthdate: Sep 01, 1945 Sex: male Admission Date (Current Location): 04/28/2019  [place] and [place] Number:  [name] [id number] [place] and Address:  [facility], [place] [address] [place], [place] [address]      Provider Number: [id number]  Attending Physician Name and Address:  Sueanne Margarita, MD  Relative Name and Phone Number:  Bryden Darden (daughter)- [phone]    Current Level of Care: Hospital Recommended Level of Care: [place] Prior Approval Number:    Date Approved/Denied: 06/22/13 PASRR Number: [id number]  Discharge Plan: SNF    Current Diagnoses: Patient Active Problem List   Diagnosis Date Noted  . Acute diastolic CHF (congestive heart failure) ([place]) 04/28/2019  . Acute on chronic congestive heart failure ([place])   . Heart failure ([place]) 08/31/2018  . Paroxysmal atrial tachycardia ([place]) 05/12/2018  . Aortic valve stenosis, nonrheumatic 05/12/2018  . Diabetes mellitus type 2 in obese ([place]) 05/12/2018  . Chronic diastolic heart failure ([place]) 11/11/2017  . Hypernatremia 08/15/2017  . AKI (acute kidney injury) ([place]) 08/15/2017  . Acute lower UTI 08/15/2017  . Coarse tremors 08/15/2017  . Amiodarone pulmonary toxicity   . Cellulitis   . Acute encephalopathy   . Acute pulmonary edema (HCC)   . Acute respiratory failure with hypoxia ([place]) 06/13/2017  . SSS (sick sinus syndrome) ([place]) 05/19/2016  . Cough 08/20/2015  . Morbid obesity ([place]) 07/09/2015  . Cardiac mass 10/04/2013  . CVA (cerebrovascular accident due to intracerebral hemorrhage) ([place]) 10/02/2013  . CVA (cerebral infarction) 10/01/2013  . History of intracranial hemorrhage 10/01/2013  . ICH (intracerebral hemorrhage) ([place]) 10/01/2013  . Right knee pain 09/07/2013  . CKD (chronic kidney disease) stage 3, GFR 30-59 ml/min (HCC) 09/07/2013  .  Orchitis 09/05/2013  . Unspecified protein-calorie malnutrition ([place]) 09/05/2013  . Sepsis ([place]) 08/27/2013  . Trichomonas infection 08/27/2013  . Epididymo-orchitis, acute 08/27/2013  . PAF (paroxysmal atrial fibrillation) ([place]) 06/24/2013  . OSA (obstructive sleep apnea) 05/04/2011  . Shortness of breath 03/17/2011  . Depression 10/23/2010  . Degenerative joint disease   . Cardiomyopathy, hypertensive ([place])   . Coronary artery disease involving native coronary artery of native heart without angina pectoris   . Pacemaker   . Incarcerated ventral hernia   . COPD (chronic obstructive pulmonary disease) ([place]) 11/28/2009  . Diabetes mellitus due to underlying condition, controlled, with stage 3 chronic kidney disease, with long-term current use of insulin ([place]) 07/20/2006  . Hypercholesterolemia 07/20/2006  . GOUT 07/20/2006  . Macular degeneration (senile) of retina 07/20/2006  . Essential hypertension 07/20/2006  . BENIGN PROSTATIC HYPERTROPHY, HX OF 07/20/2006    Orientation RESPIRATION BLADDER Height & Weight     Self, Time, Situation, Place  Normal, O2(3L during day- cpap at night) Continent Weight: (!) 142.9 kg Height:  6' (182.9 cm)  BEHAVIORAL SYMPTOMS/MOOD NEUROLOGICAL BOWEL NUTRITION STATUS  Other (Comment)(blindness)   Continent Diet  AMBULATORY STATUS COMMUNICATION OF NEEDS Skin   Limited Assist Verbally Surgical wounds                       Personal Care Assistance Level of Assistance  Bathing, Feeding, Dressing Bathing Assistance: Limited assistance Feeding assistance: Limited assistance Dressing Assistance: Limited assistance     Functional Limitations Info  Sight Sight Info: Impaired(Blind)        SPECIAL CARE FACTORS FREQUENCY  PT (By licensed PT), OT (  By licensed OT)     PT Frequency: 5x week OT Frequency: 5x week            Contractures Contractures Info: Not present    Additional Factors Info  Insulin Sliding Scale(high fall risk)        Insulin Sliding Scale Info: see d/c summary       Current Medications (05/10/2019):  This is the current hospital active medication list Current Facility-Administered Medications  Medication Dose Route Frequency Provider Last Rate Last Dose  . 0.9 %  sodium chloride infusion  250 mL Intravenous PRN Cooper, Michael, MD      . acetaminophen (TYLENOL) tablet 650 mg  650 mg Oral Q4H PRN Cooper, Michael, MD      . albuterol (PROVENTIL) (2.5 MG/3ML) 0.083% nebulizer solution 3 mL  3 mL Inhalation Q6H PRN Cooper, Michael, MD      . amLODipine (NORVASC) tablet 10 mg  10 mg Oral Daily Cooper, Michael, MD   10 mg at 05/10/19 0923  . antiseptic oral rinse (BIOTENE) solution 15 mL  15 mL Mouth Rinse TID PRN Cooper, Michael, MD      . aspirin EC tablet 81 mg  81 mg Oral Daily Cooper, Michael, MD   81 mg at 05/10/19 0921  . cholecalciferol (VITAMIN D3) tablet 1,000 Units  1,000 Units Oral Daily Cooper, Michael, MD   1,000 Units at 05/10/19 0921  . citalopram (CELEXA) tablet 20 mg  20 mg Oral Daily Cooper, Michael, MD   20 mg at 05/10/19 0921  . clopidogrel (PLAVIX) tablet 75 mg  75 mg Oral Daily Cooper, Michael, MD   75 mg at 05/10/19 0922  . famotidine (PEPCID) tablet 20 mg  20 mg Oral Daily Cooper, Michael, MD   20 mg at 05/10/19 0921  . febuxostat (ULORIC) tablet 80 mg  80 mg Oral Daily Cooper, Michael, MD   80 mg at 05/10/19 1059  . gabapentin (NEURONTIN) capsule 300 mg  300 mg Oral BID Cooper, Michael, MD   300 mg at 05/10/19 0922  . hydrALAZINE (APRESOLINE) tablet 75 mg  75 mg Oral TID Cooper, Michael, MD   75 mg at 05/10/19 0922  . insulin aspart (novoLOG) injection 0-20 Units  0-20 Units Subcutaneous TID WC Cooper, Michael, MD   11 Units at 05/10/19 1241  . insulin glargine (LANTUS) injection 70 Units  70 Units Subcutaneous QHS Cooper, Michael, MD   70 Units at 05/09/19 2302  . MEDLINE mouth rinse  15 mL Mouth Rinse BID Cooper, Michael, MD   15 mL at 05/10/19 1057  . metoprolol succinate  (TOPROL-XL) 24 hr tablet 50 mg  50 mg Oral Daily Cooper, Michael, MD   50 mg at 05/10/19 0921  . metoprolol tartrate (LOPRESSOR) injection 2.5-5 mg  2.5-5 mg Intravenous Q2H PRN Cooper, Michael, MD      . morphine 2 MG/ML injection 1-4 mg  1-4 mg Intravenous Q1H PRN Cooper, Michael, MD      . mupirocin ointment (BACTROBAN) 2 % 1 application  1 application Nasal BID Cooper, Michael, MD   1 application at 05/10/19 1057  . ondansetron (ZOFRAN) injection 4 mg  4 mg Intravenous Q6H PRN Cooper, Michael, MD   4 mg at 05/08/19 1430  . oxyCODONE (Oxy IR/ROXICODONE) immediate release tablet 5-10 mg  5-10 mg Oral Q3H PRN Cooper, Michael, MD      . polyethylene glycol (MIRALAX / GLYCOLAX) packet 17 g  17 g Oral Daily PRN Cooper, Michael, MD      .   rosuvastatin (CRESTOR) tablet 20 mg  20 mg Oral Daily [name], MD   20 mg at 05/10/19 5643  . senna-docusate (Senokot-S) tablet 2 tablet  2 tablet Oral BID [name], MD   2 tablet at 05/09/19 2301  . sodium chloride flush (NS) 0.9 % injection 3 mL  3 mL Intravenous Q12H [name], MD   3 mL at 05/10/19 0939  . sodium chloride flush (NS) 0.9 % injection 3 mL  3 mL Intravenous PRN [name], MD      . torsemide [facility]) tablet 120 mg  120 mg Oral BID [name], MD   120 mg at 05/10/19 1056  . traMADol (ULTRAM) tablet 50-100 mg  50-100 mg Oral Q4H PRN [name], MD       Facility-Administered Medications Ordered in Other Encounters  Medication Dose Route Frequency Provider Last Rate Last Dose  . chlorhexidine gluconate (MEDLINE KIT) (PERIDEX) 0.12 % solution 15 mL  15 mL Mouth Rinse BID Chesley Mires, MD      . MEDLINE mouth rinse  15 mL Mouth Rinse QID Chesley Mires, MD         Discharge Medications: Please see discharge summary for a list of discharge medications.  Relevant Imaging Results:  Relevant Lab Results:   Additional Information SS#- [id number], COVID neg- 9/21  Dawayne Patricia, RN

## 2019-05-10 NOTE — Progress Notes (Signed)
Physical Therapy Treatment Patient Details Name: [name] MRN: [id number] DOB: [birth year] Today's Date: 05/10/2019    History of Present Illness 73 year old gentleman with chronic kidney disease baseline creatinine ~2.5), morbid obesity, RV dysfunction, L metatarsal amputation and blindness. On 2L O2 at night at baseline. Pt came to ED with c/o of SoB. Admitted 04/28/19 for treatment of acute on chronic CHF, AKI, AS, HTN, CAD and paroxysmal A fib. Pt s/p TAVR proceduer 9/22.    PT Comments    Pt required +2 mod assist bed mobility, +2 min/mod assist transfers and min assist amb +2 safety 50 feet with RW. Pt on 3L continuous O2 with desat to 88% during activity. SpO2 91-96% at rest. PTA pt only used nocturnal O2. The additional need of O2 during the day increases risk for falls due to blindness/tripping over tubing. Pt with mobility regression following TAVR resulting in higher mobility assist needs. SNF at discharge remains appropriate.    Follow Up Recommendations  SNF     Equipment Recommendations  None recommended by PT    Recommendations for Other Services       Precautions / Restrictions Precautions Precautions: Fall Precaution Comments: hx of fall Restrictions Weight Bearing Restrictions: No    Mobility  Bed Mobility Overal bed mobility: Needs Assistance Bed Mobility: Supine to Sit     Supine to sit: Mod assist;+2 for physical assistance     General bed mobility comments: max directional cues, assist with BLE OOB and to elevate trunk, use of bed pad to scoot to EOB  Transfers Overall transfer level: Needs assistance Equipment used: Rolling walker (2 wheeled) Transfers: Sit to/from [facility] Sit to Stand: Min assist;Mod assist;+2 physical assistance;+2 safety/equipment;From elevated surface(+2 Min from elevated bed, +2 mod from recliner) Stand pivot transfers: Min assist;+2 safety/equipment       General transfer comment: cues for  sequencing and safety  Ambulation/Gait Ambulation/Gait assistance: +2 safety/equipment;Min assist Gait Distance (Feet): 50 Feet Assistive device: Rolling walker (2 wheeled) Gait Pattern/deviations: Step-through pattern;Decreased stride length Gait velocity: decreased Gait velocity interpretation: <1.31 ft/sec, indicative of household ambulator General Gait Details: min assist due to blindness. Assist for managing in unfamiliar environment.   Stairs             Wheelchair Mobility    Modified Rankin (Stroke Patients Only)       Balance Overall balance assessment: Needs assistance Sitting-balance support: Feet supported;No upper extremity supported Sitting balance-Leahy Scale: Fair Sitting balance - Comments: able to static sit without support   Standing balance support: Bilateral upper extremity supported;During functional activity Standing balance-Leahy Scale: Poor Standing balance comment: requires UE support                            Cognition Arousal/Alertness: Awake/alert Behavior During Therapy: WFL for tasks assessed/performed Overall Cognitive Status: Impaired/Different from baseline Area of Impairment: Safety/judgement                         Safety/Judgement: Decreased awareness of safety;Decreased awareness of deficits     General Comments: pleasant and agreeable to therapy      Exercises      General Comments General comments (skin integrity, edema, etc.): Pt required 3L O2 with activity. Desat to 88% during amb on 3L. 91-96% at rest. At baseline, pt only uses O2 at night. O2 tubing during activity adds increased risk for falls.  Pertinent Vitals/Pain Pain Assessment: Faces Faces Pain Scale: Hurts little more Pain Location: generalized soreness Pain Descriptors / Indicators: Sore;Tender;Grimacing;Guarding Pain Intervention(s): Monitored during session;Repositioned;Premedicated before session    Home Living                       Prior Function            PT Goals (current goals can now be found in the care plan section) Acute Rehab PT Goals Patient Stated Goal: go home PT Goal Formulation: With patient Time For Goal Achievement: 05/15/19 Potential to Achieve Goals: Good Progress towards PT goals: Not progressing toward goals - comment(mild regression due to TAVR)    Frequency    Min 3X/week      PT Plan Current plan remains appropriate    Co-evaluation PT/OT/SLP Co-Evaluation/Treatment: Yes Reason for Co-Treatment: Complexity of the patient's impairments (multi-system involvement);For patient/therapist safety;To address functional/ADL transfers PT goals addressed during session: Mobility/safety with mobility;Balance;Proper use of DME OT goals addressed during session: ADL's and self-care;Proper use of Adaptive equipment and DME      AM-PAC PT "6 Clicks" Mobility   Outcome Measure  Help needed turning from your back to your side while in a flat bed without using bedrails?: A Lot Help needed moving from lying on your back to sitting on the side of a flat bed without using bedrails?: A Lot Help needed moving to and from a bed to a chair (including a wheelchair)?: A Little Help needed standing up from a chair using your arms (e.g., wheelchair or bedside chair)?: A Lot Help needed to walk in hospital room?: A Little Help needed climbing 3-5 steps with a railing? : A Lot 6 Click Score: 14    End of Session Equipment Utilized During Treatment: Gait belt;Oxygen Activity Tolerance: Patient tolerated treatment well Patient left: in chair;with call bell/phone within reach Nurse Communication: Mobility status PT Visit Diagnosis: Other abnormalities of gait and mobility (R26.89);Muscle weakness (generalized) (M62.81)     Time: 1791-5056 PT Time Calculation (min) (ACUTE ONLY): 35 min  Charges:  $Gait Training: 8-22 mins                     Lorrin Goodell, PT  Office #  [phone] Pager [phone]    Lorriane Shire 05/10/2019, 11:33 AM

## 2019-05-10 NOTE — Progress Notes (Signed)
Occupational Therapy Treatment Patient Details Name: [name] MRN: [id number] DOB: 1946/07/18 Today's Date: 05/10/2019    History of present illness 73 year old gentleman with chronic kidney disease baseline creatinine ~2.5), morbid obesity, RV dysfunction, L metatarsal amputation and blindness. On 2L O2 at night at baseline. Pt came to ED with c/o of SoB. Admitted 04/28/19 for treatment of acute on chronic CHF, AKI, AS, HTN, CAD and paroxysmal A fib. Pt s/p TAVR proceduer 9/22.   OT comments  Pt not progressing towards OT goals, increased need for assist with transfers (mod A +2 from recliner), Pt presents with decreased activity tolerance, deconditioning, and decreased safety awareness of deficits. Blindness aside, at this point I do not think that he is safe to go home without 24 hour supervision. He will benefit from SNF placement where he can improve on balance, strength, activity tolerance, and other factors impacting ADLs. Dc plan updated.    Follow Up Recommendations  SNF;Supervision/Assistance - 24 hour    Equipment Recommendations  None recommended by OT    Recommendations for Other Services      Precautions / Restrictions Precautions Precautions: Fall Precaution Comments: hx of fall Restrictions Weight Bearing Restrictions: No       Mobility Bed Mobility Overal bed mobility: Needs Assistance Bed Mobility: Supine to Sit     Supine to sit: Mod assist;+2 for physical assistance     General bed mobility comments: max directional verbal and tactile cues, pt modA for trunk elevation to sit EOB, assist for BLE to come to EOB and use of pad to assist with hips  Transfers Overall transfer level: Needs assistance Equipment used: Rolling walker (2 wheeled)(typically uses Rollator at baseline) Transfers: Sit to/from Stand Sit to Stand: Min assist;Mod assist;+2 physical assistance;+2 safety/equipment;From elevated surface(min A +2 from elevated bed, mod A +2 from  recliner)         General transfer comment: multimodal cues to improve body positioning for transfers    Balance Overall balance assessment: Needs assistance Sitting-balance support: Feet supported;No upper extremity supported Sitting balance-Leahy Scale: Fair Sitting balance - Comments: able to static sit without support   Standing balance support: Bilateral upper extremity supported;Single extremity supported Standing balance-Leahy Scale: Poor Standing balance comment: requires UE support                           ADL either performed or assessed with clinical judgement   ADL Overall ADL's : At baseline Eating/Feeding: Set up;Sitting Eating/Feeding Details (indicate cue type and reason): Pt able to eat breakfast without assist, just set up for finding foods and utensils                     Toilet Transfer: Moderate assistance;+2 for physical assistance;+2 for safety/equipment;Ambulation;RW;Requires wide/bariatric Toilet Transfer Details (indicate cue type and reason): mod A +2 to boost from recliner, min A for navigation with RW +2 safety for line managment (O2 line) Toileting- Clothing Manipulation and Hygiene: Moderate assistance;Sit to/from stand Toileting - Clothing Manipulation Details (indicate cue type and reason): assistance for posterior pericare;pt able to clean most of front in standing with RW for support     Functional mobility during ADLs: Minimal assistance;+2 for safety/equipment;Rolling walker;Cueing for safety;Cueing for sequencing General ADL Comments: modA +2 to powerup to standing from recliner;minguard for safety while standing; fatigues quickly in standing for ADL tasks     Vision       Perception  Praxis      Cognition Arousal/Alertness: Awake/alert Behavior During Therapy: WFL for tasks assessed/performed Overall Cognitive Status: Impaired/Different from baseline Area of Impairment: Safety/judgement                          Safety/Judgement: Decreased awareness of safety;Decreased awareness of deficits     General Comments: pleasant and agreeable to therapy        Exercises     Shoulder Instructions       General Comments Pt required 3L O2 with activity today, and dropped to 88%, at rest, on 3L was saturating between 90-96% - Patient reports he typically only uses O2 at night    Pertinent Vitals/ Pain       Pain Assessment: Faces Faces Pain Scale: Hurts little more Pain Location: generalized soreness Pain Descriptors / Indicators: Sore;Tender Pain Intervention(s): Monitored during session;Repositioned;Premedicated before session  Home Living                                          Prior Functioning/Environment              Frequency  Min 2X/week        Progress Toward Goals  OT Goals(current goals can now be found in the care plan section)  Progress towards OT goals: Not progressing toward goals - comment(decreased balance, activity tolerance, strength this session)  Acute Rehab OT Goals Patient Stated Goal: go home OT Goal Formulation: With patient Time For Goal Achievement: 05/16/19 Potential to Achieve Goals: Good  Plan Discharge plan needs to be updated;Frequency remains appropriate    Co-evaluation    PT/OT/SLP Co-Evaluation/Treatment: Yes Reason for Co-Treatment: Complexity of the patient's impairments (multi-system involvement);For patient/therapist safety;To address functional/ADL transfers PT goals addressed during session: Mobility/safety with mobility;Balance;Proper use of DME OT goals addressed during session: ADL's and self-care;Proper use of Adaptive equipment and DME      AM-PAC OT "6 Clicks" Daily Activity     Outcome Measure   Help from another person eating meals?: A Little Help from another person taking care of personal grooming?: A Little Help from another person toileting, which includes using toliet, bedpan, or  urinal?: A Little Help from another person bathing (including washing, rinsing, drying)?: A Little Help from another person to put on and taking off regular upper body clothing?: A Little Help from another person to put on and taking off regular lower body clothing?: A Lot 6 Click Score: 17    End of Session Equipment Utilized During Treatment: Rolling walker;Oxygen(3L)  OT Visit Diagnosis: Other abnormalities of gait and mobility (R26.89);Muscle weakness (generalized) (M62.81);Low vision, both eyes (H54.2)   Activity Tolerance Patient tolerated treatment well   Patient Left in chair;with call bell/phone within reach   Nurse Communication Mobility status        Time: 5784-6962 OT Time Calculation (min): 31 min  Charges: OT General Charges $OT Visit: 1 Visit OT Treatments $Self Care/Home Management : 8-22 mins  Hulda Humphrey OTR/L Acute Rehabilitation Services Pager: [phone] Office: [place] 05/10/2019, 11:17 AM

## 2019-05-11 LAB — BASIC METABOLIC PANEL
Anion gap: 10 (ref 5–15)
BUN: 80 mg/dL — ABNORMAL HIGH (ref 8–23)
CO2: 31 mmol/L (ref 22–32)
Calcium: 8.6 mg/dL — ABNORMAL LOW (ref 8.9–10.3)
Chloride: 93 mmol/L — ABNORMAL LOW (ref 98–111)
Creatinine, Ser: 2.99 mg/dL — ABNORMAL HIGH (ref 0.61–1.24)
GFR calc Af Amer: 23 mL/min — ABNORMAL LOW (ref >60)
GFR calc non Af Amer: 20 mL/min — ABNORMAL LOW (ref >60)
Glucose, Bld: 167 mg/dL — ABNORMAL HIGH (ref 70–99)
Potassium: 3.5 mmol/L (ref 3.5–5.1)
Sodium: 134 mmol/L — ABNORMAL LOW (ref 135–145)

## 2019-05-11 LAB — CBC
HCT: 32.3 % — ABNORMAL LOW (ref 39.0–52.0)
Hemoglobin: 9.7 g/dL — ABNORMAL LOW (ref 13.0–17.0)
MCH: 24.5 pg — ABNORMAL LOW (ref 26.0–34.0)
MCHC: 30 g/dL (ref 30.0–36.0)
MCV: 81.6 fL (ref 80.0–100.0)
Platelets: 168 10*3/uL (ref 150–400)
RBC: 3.96 MIL/uL — ABNORMAL LOW (ref 4.22–5.81)
RDW: 15.9 % — ABNORMAL HIGH (ref 11.5–15.5)
WBC: 13.8 10*3/uL — ABNORMAL HIGH (ref 4.0–10.5)
nRBC: 0 % (ref 0.0–0.2)

## 2019-05-11 LAB — SARS CORONAVIRUS 2 BY RT PCR (HOSPITAL ORDER, PERFORMED IN ~~LOC~~ HOSPITAL LAB): SARS Coronavirus 2: NEGATIVE

## 2019-05-11 LAB — GLUCOSE, CAPILLARY
Glucose-Capillary: 165 mg/dL — ABNORMAL HIGH (ref 70–99)
Glucose-Capillary: 322 mg/dL — ABNORMAL HIGH (ref 70–99)

## 2019-05-11 MED ORDER — HYDRALAZINE HCL 25 MG PO TABS: 75.0000 mg | ORAL_TABLET | Freq: Three times a day (TID) | ORAL | Status: DC

## 2019-05-11 MED ORDER — METOLAZONE 5 MG PO TABS
2.5000 mg | ORAL_TABLET | Freq: Once | ORAL | Status: AC
  Administered 2019-05-11: 2.5 mg via ORAL
  Filled 2019-05-11: qty 1

## 2019-05-11 MED ORDER — METOLAZONE 2.5 MG PO TABS: 2.5000 mg | ORAL_TABLET | ORAL | Status: DC

## 2019-05-11 MED ORDER — INSULIN ASPART 100 UNIT/ML ~~LOC~~ SOLN: 0.0000 [IU] | Freq: Three times a day (TID) | SUBCUTANEOUS | 11 refills | Status: DC

## 2019-05-11 MED ORDER — POTASSIUM CHLORIDE CRYS ER 20 MEQ PO TBCR: 40.0000 meq | EXTENDED_RELEASE_TABLET | Freq: Two times a day (BID) | ORAL | Status: DC

## 2019-05-11 MED ORDER — CLOPIDOGREL BISULFATE 75 MG PO TABS: 75.0000 mg | ORAL_TABLET | Freq: Every day | ORAL | Status: DC

## 2019-05-11 NOTE — Progress Notes (Signed)
Attempted to call report to nurse at [facility] No answer X2.   K. Clement Husbands, RN

## 2019-05-11 NOTE — Progress Notes (Addendum)
Patient ID: [name], male   DOB: 1945-10-13, 73 y.o.   MRN: [id number]     Advanced Heart Failure Rounding Note  PCP-Cardiologist: No primary care provider on file.   Subjective:    TAVR with Edwards Sapien 3 valve on 9/22.    Post-TAVR echo with EF 65-70%, moderate LVH, normal RV, bioprosthetic AoV s/p TAVR with mean gradient 13 mmHg.   Creatinine/BUN higher today.   Denies SOB. Denies pain.   Objective:   Weight Range: (!) 144.4 kg Body mass index is 43.18 kg/m.   Vital Signs:   Temp:  [97.6 F (36.4 C)-98.8 F (37.1 C)] 98.8 F (37.1 C) (09/25 0417) Pulse Rate:  [63-68] 68 (09/25 0417) Resp:  [12-25] 20 (09/25 0417) BP: (108-158)/(61-78) 127/62 (09/25 0417) SpO2:  [95 %-99 %] 96 % (09/25 0417) Weight:  [144.4 kg] 144.4 kg (09/25 0417) Last BM Date: 05/10/19  Weight change: Filed Weights   05/09/19 0607 05/10/19 0555 05/11/19 0417  Weight: (!) 138.9 kg (!) 142.9 kg (!) 144.4 kg    Intake/Output:   Intake/Output Summary (Last 24 hours) at 05/11/2019 0941 Last data filed at 05/10/2019 2005 Gross per 24 hour  Intake 480 ml  Output -  Net 480 ml      Physical Exam   General:  No resp difficulty. Sitting in the chair HEENT: normal Neck: supple. JVP to to jaw. Carotids 2+ bilat; no bruits. No lymphadenopathy or thryomegaly appreciated. Cor: PMI nondisplaced. Regular rate & rhythm. No rubs, gallops. 2/6 AS  Lungs: clear Abdomen: soft, nontender, nondistended. No hepatosplenomegaly. No bruits or masses. Good bowel sounds. Extremities: no cyanosis, clubbing, rash, edema Neuro: alert & orientedx3, cranial nerves grossly intact. moves all 4 extremities w/o difficulty. Affect pleasant   Telemetry   A-V sequential pacing alternating with NSR with 1st degree AVB (personally reviewed)  Labs    CBC Recent Labs    05/10/19 0204 05/11/19 0301  WBC 16.8* 13.8*  HGB 9.8* 9.7*  HCT 32.2* 32.3*  MCV 81.9 81.6  PLT 160 756   Basic Metabolic Panel  Recent Labs    05/09/19 0035 05/10/19 0204 05/11/19 0301  NA 137 134* 134*  K 4.1 3.6 3.5  CL 94* 93* 93*  CO2 31 29 31   GLUCOSE 221* 192* 167*  BUN 57* 71* 80*  CREATININE 2.33* 2.69* 2.99*  CALCIUM 8.8* 8.5* 8.6*  MG 2.0  --   --    Liver Function Tests No results for input(s): AST, ALT, ALKPHOS, BILITOT, PROT, ALBUMIN in the last 72 hours. No results for input(s): LIPASE, AMYLASE in the last 72 hours. Cardiac Enzymes No results for input(s): CKTOTAL, CKMB, CKMBINDEX, TROPONINI in the last 72 hours.  BNP: BNP (last 3 results) Recent Labs    08/31/18 1406 04/28/19 1011 05/07/19 0932  BNP 151.0* 140.8* 115.7*    ProBNP (last 3 results) No results for input(s): PROBNP in the last 8760 hours.   D-Dimer No results for input(s): DDIMER in the last 72 hours. Hemoglobin A1C No results for input(s): HGBA1C in the last 72 hours. Fasting Lipid Panel No results for input(s): CHOL, HDL, LDLCALC, TRIG, CHOLHDL, LDLDIRECT in the last 72 hours. Thyroid Function Tests No results for input(s): TSH, T4TOTAL, T3FREE, THYROIDAB in the last 72 hours.  Invalid input(s): FREET3  Other results:   Imaging    No results found.   Medications:     Scheduled Medications: . amLODipine  10 mg Oral Daily  . aspirin EC  81 mg  Oral Daily  . cholecalciferol  1,000 Units Oral Daily  . citalopram  20 mg Oral Daily  . clopidogrel  75 mg Oral Daily  . famotidine  20 mg Oral Daily  . febuxostat  80 mg Oral Daily  . gabapentin  300 mg Oral BID  . hydrALAZINE  75 mg Oral TID  . insulin aspart  0-20 Units Subcutaneous TID WC  . insulin glargine  70 Units Subcutaneous QHS  . mouth rinse  15 mL Mouth Rinse BID  . metoprolol succinate  50 mg Oral Daily  . mupirocin ointment  1 application Nasal BID  . rosuvastatin  20 mg Oral Daily  . senna-docusate  2 tablet Oral BID  . sodium chloride flush  3 mL Intravenous Q12H  . torsemide  120 mg Oral BID    Infusions: . sodium chloride       PRN Medications: sodium chloride, acetaminophen, albuterol, antiseptic oral rinse, metoprolol tartrate, morphine injection, ondansetron (ZOFRAN) IV, oxyCODONE, polyethylene glycol, sodium chloride flush, traMADol   Assessment/Plan   1. Acute on chronic diastolic CHF with prominent RV failure: Volume difficult to manage in setting of RV failure (OHS/OSA), CKD, and severe AS.  Echo this admission with EF 60-65%, RV reportedly normal, mod-severe aortic stenosis with mean gradient 30 mmHg. He was admitted with increased dyspnea (at rest).  Creatinine noted to be worse this admission, suspect contrast nephropathy from recent cath as well as cardiorenal. Now s/p TAVR on 9/22.   - Volume status trending up .  Continue po torsemide today and give dose of metolazone.   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    - We will set up PYP scan and send myeloma panel.  2. Aortic stenosis:  Successful TAVR with Edwards Sapien 3 valve on 9/22.  Post-TAVR echo stable with mean gradient 13 mmHg, no AI.  - Continue ASA 81 + Plavix 75.  3. Atrial fibrillation:  He appears to be in sinus with very low voltage Ps (regular rhythm) alternating with a-pacing.  He is not anticoagulated (takes ASA) due to prior intracerebral hemorrhage. 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   Rate controlled.  - Continue Toprol XL 50 mg daily.  4. CAD: Nonobstructive on 8/20 cath.  No chest pain. - Continue ASA and statin.   5. 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  6. AKI on CKD stage III: creatinine 1.97 prior, up to 2.78 at admission.  May have had contrast nephropathy from recent cath, also suspect cardiorenal syndrome.  Creatinine /BUN trending up.    Creatinine up to 3 today.  7. OHS/OSA: Continue nightly CPAP and home oxygen.  8. DM: Blood glucose improved with adjustment to insulin.    Length of Stay: [place],  NP  05/11/2019, 9:41 AM  Advanced Heart Failure Team Pager [phone] (M-F; 7a - 4p)  Please contact [facility] for night-coverage after hours (4p -7a ) and weekends on [email or web address]  Patient seen with NP, agree with the above note.   Agree with a dose of metolazone today, but after this would try to keep metolazone at once a week.    He is going to SNF today, needs BMET done next week.  Continue torsemide 120 mg bid.   He will need PYP scan as outpatient.   Loralie Champagne 05/11/2019

## 2019-05-11 NOTE — TOC Progression Note (Signed)
Transition of Care [facility]) - Progression Note    Patient Details  Name: [name] MRN: [id number] Date of Birth: 02/24/1946  Transition of Care [facility]) CM/SW [place], [place] Phone Number: 05/11/2019, 2:06 PM  Clinical Narrative:     Patient has accepted bed offer at [facility]. Must call  [facility] [phone]- To arrange transportation.   Covid pending- SNF, [facility] SW and RN informed   CSW will follow for test results and assist with discharge planning.   Thurmond Butts, MSW, [facility] Clinical Social Worker [phone]   Expected Discharge Plan: [place] Barriers to Discharge: Continued Medical Work up  Expected Discharge Plan and Services Expected Discharge Plan: [place] In-house Referral: NA Discharge Planning Services: CM Consult Post Acute Care Choice: [place] Living arrangements for the past 2 months: Apartment(single story active with [facility])                                       Social Determinants of Health (SDOH) Interventions    Readmission Risk Interventions Readmission Risk Prevention Plan 05/08/2019  [place] or Home Care Consult Complete  Social Work Consult for [place] Planning/Counseling Complete  Medication Review Press photographer) Complete  Some recent data might be hidden

## 2019-05-11 NOTE — Progress Notes (Signed)
Report given to Ander Purpura, RN at [facility].   Emelda Fear, RN

## 2019-05-11 NOTE — TOC Progression Note (Addendum)
Transition of Care [facility]) - Progression Note    Patient Details  Name: [name] MRN: [id number] Date of Birth: 08/24/1945  Transition of Care [facility]) CM/SW [place], [place] Phone Number: 05/11/2019, 10:19 AM  Clinical Narrative:     [name]- is reviewing- patient left AMA, they will call CSW and confirm placement once reviewed.   [facility] - reviewing with [facility] SW- will contact CSW once a decision has been made.   Patient  Will need covid test- RN updated  Thurmond Butts, MSW, [facility] Clinical Social Worker [phone]   Expected Discharge Plan: [place] Barriers to Discharge: Continued Medical Work up  Expected Discharge Plan and Services Expected Discharge Plan: [place] In-house Referral: NA Discharge Planning Services: CM Consult Post Acute Care Choice: [place] Living arrangements for the past 2 months: Apartment(single story active with [facility])                                       Social Determinants of Health (SDOH) Interventions    Readmission Risk Interventions Readmission Risk Prevention Plan 05/08/2019  [place] or Home Care Consult Complete  Social Work Consult for [place] Planning/Counseling Complete  Medication Review Press photographer) Complete  Some recent data might be hidden

## 2019-05-11 NOTE — Plan of Care (Signed)
Discharge to SNF.

## 2019-05-11 NOTE — TOC Transition Note (Signed)
Transition of Care [facility]) - CM/SW Discharge Note   Patient Details  Name: [name] MRN: [id number] Date of Birth: June 30, 1946  Transition of Care [facility]) CM/SW Contact:  Vinie Sill, [place] Phone Number: 05/11/2019, 2:42 PM   Clinical Narrative:     Patient will DC to: [place] Date: 05/11/2019 Family Notified: Teacher, music, daughter  Transport By: [facility]   SW Ann with [facility] states they will have sister bring patient's C-Pap from home to SNF.   RN, patient, and facility notified of DC. Discharge Summary sent to facility. RN given number for [phone], Room 422- window. [facility]  transport requested for patient.   Clinical Social Worker signing off.  Thurmond Butts, MSW, [facility] Clinical Social Worker [phone]     Final next level of care: Skilled Nursing Facility Barriers to Discharge: No Barriers Identified   Patient Goals and CMS Choice Patient states their goals for this hospitalization and ongoing recovery are:: want to go home, but agreeable to going to rehab CMS Medicare.gov Compare Post Acute Care list provided to:: Patient Represenative (must comment)([facility]) Choice offered to / list presented to : Patient  Discharge Placement PASRR number recieved: 05/10/19            Patient [facility] bed at: [facility] Patient to be transferred to facility by: [facility] Name of family member notified: Donella Stade, daughter Patient and family notified of of transfer: 05/11/19  Discharge Plan and Services In-house Referral: NA Discharge Planning Services: CM Consult Post Acute Care Choice: [place]                               Social Determinants of Health (SDOH) Interventions     Readmission Risk Interventions Readmission Risk Prevention Plan 05/08/2019  [place] or Home Care Consult Complete  Social Work Consult for [place] Planning/Counseling Complete  Medication Review Press photographer) Complete  Some recent data  might be hidden

## 2019-05-11 NOTE — Progress Notes (Signed)
Made aware that pt is for d/c to SNF. Discussed restrictions and low sodium diet. Pt had questions regarding sodium and carbs. N/a for CRPII.  [id number] Yves Dill CES, ACSM 3:05 PM 05/11/2019

## 2019-05-11 NOTE — Discharge Summary (Addendum)
Advanced Heart Failure Team  Discharge Summary   Patient ID: [name] MRN: [id number], DOB/AGE: 03/07/1946 73 y.o. Admit date: 04/28/2019 D/C date:     05/11/2019   Primary Discharge Diagnoses:  1. Acute on chronic diastolic CHF with prominent RV failure 2. Aortic stenosis:Successful TAVR with Edwards Sapien 3 valve on 9/22.   3. Atrial fibrillation 4. CAD 5. 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  6. AKI on CKD stage III:  7.OHS/OSA  8. DM  Hospital Course:  RyanFletcher a 73 year old gentleman with chronic kidney disease baseline creatinine ~2.5), morbid obesity, RV dysfunction, and blindness. He has undergone permanent pacemaker placement in the past.In Jan 2020 he was hospitalized with acute on chronic diastolic CHF in the setting of severe AS. His hospitalization was complicated by AKI and was felt at that time not to be a good candidate for TAVR due to morbid obesity, poor functional capacity, chronic deconditioning, RV dysfunction and blindness.   He was seen back by Dr. Burt Fletcher 03/09/2019 in the office and had ongoing SOB. He was set up for right and left heart catheterization as part of his evaluationon 04/10/2019 showing diffuse calcific non obstuctive CAD and moderately severe AS with moderate PHTN with mean PAP 50mmHg. He was set up to see Dr. Cyndia Fletcher in consult for TAVR but presented to the ER on 9/12 with complaints of increasing SOB.   Admitted with A/C diastolic HF. Diuresed with IV lasix and once stable he had TAVR. Post op course relatively uncomplicated. See below for detailed problem list.    He is COVID negative for discharge to SNF today.   1. Acute on chronic diastolic CHF with prominent RV failure:Volume difficult to manage in setting of RV failure (OHS/OSA), CKD, and severe AS.  Echo this admission with EF 60-65%, RV reportedly normal, mod-severe aortic stenosis with mean gradient 30 mmHg. He was admitted with  increased dyspnea (at rest).  Creatinine noted to be worse this admission, suspect contrast nephropathy from recent cath as well as cardiorenal. Now s/p TAVR on 9/22.   -Diuresed with IV lasix and transitioned to torsemide today and weekly metolazone.  Plan to continue metolzone once weekly.  - He will need  BMET next week at SNF.  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    - We will set up PYP scan and send myeloma panel.  2. Aortic stenosis:Successful TAVR with Edwards Sapien 3 valve on 9/22.  Post-TAVR echo stable with mean gradient 13 mmHg, no AI.  - Continue ASA 81 + Plavix 75.  3. Atrial fibrillation:Maintained NSR and had very low voltage Ps (regular rhythm) alternating with a-pacing. He is not anticoagulated (takes ASA) due to prior intracerebral hemorrhage. 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  Rate controlled.  - Continue Toprol XL 50 mg daily.  4. CAD: Nonobstructive on 8/20 cath. No chest pain. - Continue ASA and statin.  5. 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  6. AKI on CKD stage III: creatinine 1.97 prior, up to 2.78 at admission.  May have had contrast nephropathy from recent cath, also suspect cardiorenal syndrome.  Creatinine /BUN trending up.    Creatinine up to 3 today.  He will need BMET next week at SNF.  7.OHS/OSA: Continue nightly CPAPand home oxygen. He will need 3 liters nasal cannula continuously.  8. DM: Blood glucose improved with adjustment to insulin. Continue CBGs  AC&HS with sliding scale coverage.  9. Deconditioning - PT evaluated and recommended SNF. SW consulted.    Discharge Vitals: Blood pressure 127/62, pulse 60, temperature 98.8 F (37.1 C), temperature source Axillary, resp. rate (!) 22, height 6' (1.829 m), weight (!) 144.4 kg, SpO2 100 %.  Labs: Lab Results  Component Value Date   WBC 13.8 (H) 05/11/2019   HGB 9.7 (L)  05/11/2019   HCT 32.3 (L) 05/11/2019   MCV 81.6 05/11/2019   PLT 168 05/11/2019    Recent Labs  Lab 05/07/19 0932  05/11/19 0301  NA 135   < > 134*  K 3.6   < > 3.5  CL 90*   < > 93*  CO2 34*   < > 31  BUN 64*   < > 80*  CREATININE 2.55*   < > 2.99*  CALCIUM 9.0   < > 8.6*  PROT 7.5  --   --   BILITOT 0.6  --   --   ALKPHOS 76  --   --   ALT 23  --   --   AST 22  --   --   GLUCOSE 247*   < > 167*   < > = values in this interval not displayed.   Lab Results  Component Value Date   CHOL 115 03/21/2018   HDL 33 (L) 03/21/2018   LDLCALC 66 03/21/2018   TRIG 80 03/21/2018   BNP (last 3 results) Recent Labs    08/31/18 1406 04/28/19 1011 05/07/19 0932  BNP 151.0* 140.8* 115.7*    ProBNP (last 3 results) No results for input(s): PROBNP in the last 8760 hours.   Diagnostic Studies/Procedures   No results found.  Discharge Medications   Allergies as of 05/11/2019   No Known Allergies     Medication List    TAKE these medications   acetaminophen 650 MG CR tablet Commonly known as: TYLENOL Take 1,300 mg by mouth 2 (two) times daily.   albuterol 108 (90 Base) MCG/ACT inhaler Commonly known as: VENTOLIN HFA Inhale 2 puffs into the lungs every 6 (six) hours as needed for wheezing or shortness of breath.   amLODipine 10 MG tablet Commonly known as: NORVASC Take 10 mg by mouth daily. Reported on 12/09/2015   antiseptic oral rinse Liqd 15 mLs by Mouth Rinse route 3 (three) times daily as needed for dry mouth.   aspirin EC 81 MG tablet Take 1 tablet (81 mg total) by mouth daily.   BIOFREEZE EX Apply 1 application topically 2 (two) times daily as needed (knee/shoulder pain).   cholecalciferol 1000 units tablet Commonly known as: VITAMIN D Take 1,000 Units by mouth daily.   citalopram 20 MG tablet Commonly known as: CELEXA Take 20 mg by mouth daily.   clopidogrel 75 MG tablet Commonly known as: PLAVIX Take 1 tablet (75 mg total) by mouth daily. Start  taking on: May 12, 2019   eucerin cream Apply 1 application topically 2 (two) times daily.   famotidine 20 MG tablet Commonly known as: PEPCID Take 20 mg by mouth daily.   gabapentin 300 MG capsule Commonly known as: NEURONTIN Take 300 mg by mouth 2 (two) times daily.   hydrALAZINE 25 MG tablet Commonly known as: APRESOLINE Take 3 tablets (75 mg total) by mouth 3 (three) times daily. What changed:   medication strength  how much to take   insulin aspart 100 UNIT/ML injection Commonly known as: novoLOG Inject 0-20 Units into the skin  3 (three) times daily with meals.   insulin glargine 100 UNIT/ML injection Commonly known as: LANTUS Inject 0.25 mLs (25 Units total) into the skin at bedtime. What changed: how much to take   metolazone 2.5 MG tablet Commonly known as: ZAROXOLYN Take 1 tablet (2.5 mg total) by mouth once a week. Every Friday What changed:   when to take this  additional instructions   metoprolol succinate 50 MG 24 hr tablet Commonly known as: TOPROL-XL Take 1 tablet (50 mg total) by mouth daily. Take with or immediately following a meal.   polyethylene glycol 17 g packet Commonly known as: MIRALAX / GLYCOLAX Take 17 g by mouth daily as needed for moderate constipation.   potassium chloride SA 20 MEQ tablet Commonly known as: K-DUR Take 2 tablets (40 mEq total) by mouth 2 (two) times daily. What changed:   how much to take  when to take this   rosuvastatin 20 MG tablet Commonly known as: CRESTOR Take 20 mg by mouth daily.   sennosides-docusate sodium 8.6-50 MG tablet Commonly known as: SENOKOT-S Take 2 tablets by mouth 2 (two) times daily.   torsemide 20 MG tablet Commonly known as: DEMADEX Take 6 tablets (120 mg total) by mouth 2 (two) times daily. What changed:   how much to take  Another medication with the same name was removed. Continue taking this medication, and follow the directions you see here.   Uloric 80 MG  Tabs Generic drug: Febuxostat Take 80 mg by mouth daily.   Victoza 18 MG/3ML Sopn Generic drug: liraglutide Inject 1.8 mg into the skin daily.       Disposition   The patient will be discharged in stable condition to [facility]. Discharge Instructions    (HEART FAILURE PATIENTS) Call MD:  Anytime you have any of the following symptoms: 1) 3 pound weight gain in 24 hours or 5 pounds in 1 week 2) shortness of breath, with or without a dry hacking cough 3) swelling in the hands, feet or stomach 4) if you have to sleep on extra pillows at night in order to breathe.   Complete by: As directed    Diet - low sodium heart healthy   Complete by: As directed    Heart Failure patients record your daily weight using the same scale at the same time of day   Complete by: As directed    Increase activity slowly   Complete by: As directed      Follow-up Information    [place] [place] Follow up on 05/23/2019.   Specialty: Cardiology Why: at 1000 am  Contact information: [address] [id number] [place]       Eileen Stanford, PA-C. Go on 06/07/2019.   Specialties: Cardiology, Radiology Why: @ 12:45 pm for an echocardiogram (ultrasound of your heart) followed by a visit with Nell Range PA-C Contact information: [place] [address] [phone]             Duration of Discharge Encounter: Greater than 35 minutes   Signed, Tabbatha Bordelon NP-C  05/11/2019, 2:23 PM

## 2019-05-11 NOTE — Progress Notes (Signed)
Patient left via [facility] at this time.

## 2019-05-14 ENCOUNTER — Inpatient Hospital Stay (HOSPITAL_COMMUNITY): Payer: Medicare (Managed Care)

## 2019-05-17 ENCOUNTER — Encounter (HOSPITAL_COMMUNITY): Payer: Self-pay | Admitting: Cardiovascular Disease

## 2019-05-23 ENCOUNTER — Ambulatory Visit (HOSPITAL_COMMUNITY)
Admit: 2019-05-23 | Discharge: 2019-05-23 | Disposition: A | Discharge status: Home / Self Care | Payer: Medicare (Managed Care) | Attending: Internal Medicine | Admitting: Internal Medicine

## 2019-05-23 ENCOUNTER — Other Ambulatory Visit: Payer: Self-pay

## 2019-05-23 ENCOUNTER — Encounter (HOSPITAL_COMMUNITY): Payer: Self-pay

## 2019-05-23 VITALS — BP 147/86 | HR 77 | Wt 314.0 lb

## 2019-05-23 DIAGNOSIS — N400 Enlarged prostate without lower urinary tract symptoms: Secondary | ICD-10-CM | POA: Diagnosis not present

## 2019-05-23 DIAGNOSIS — K219 Gastro-esophageal reflux disease without esophagitis: Secondary | ICD-10-CM | POA: Insufficient documentation

## 2019-05-23 DIAGNOSIS — E785 Hyperlipidemia, unspecified: Secondary | ICD-10-CM | POA: Insufficient documentation

## 2019-05-23 DIAGNOSIS — Z7982 Long term (current) use of aspirin: Secondary | ICD-10-CM | POA: Diagnosis not present

## 2019-05-23 DIAGNOSIS — H548 Legal blindness, as defined in USA: Secondary | ICD-10-CM | POA: Insufficient documentation

## 2019-05-23 DIAGNOSIS — I350 Nonrheumatic aortic (valve) stenosis: Secondary | ICD-10-CM | POA: Diagnosis not present

## 2019-05-23 DIAGNOSIS — G4733 Obstructive sleep apnea (adult) (pediatric): Secondary | ICD-10-CM | POA: Insufficient documentation

## 2019-05-23 DIAGNOSIS — I5032 Chronic diastolic (congestive) heart failure: Secondary | ICD-10-CM | POA: Diagnosis not present

## 2019-05-23 DIAGNOSIS — Z7902 Long term (current) use of antithrombotics/antiplatelets: Secondary | ICD-10-CM | POA: Diagnosis not present

## 2019-05-23 DIAGNOSIS — J449 Chronic obstructive pulmonary disease, unspecified: Secondary | ICD-10-CM

## 2019-05-23 DIAGNOSIS — I2510 Atherosclerotic heart disease of native coronary artery without angina pectoris: Secondary | ICD-10-CM | POA: Diagnosis not present

## 2019-05-23 DIAGNOSIS — N183 Chronic kidney disease, stage 3 unspecified: Secondary | ICD-10-CM | POA: Diagnosis not present

## 2019-05-23 DIAGNOSIS — I480 Paroxysmal atrial fibrillation: Secondary | ICD-10-CM

## 2019-05-23 DIAGNOSIS — Z950 Presence of cardiac pacemaker: Secondary | ICD-10-CM | POA: Diagnosis not present

## 2019-05-23 DIAGNOSIS — Z952 Presence of prosthetic heart valve: Secondary | ICD-10-CM | POA: Diagnosis not present

## 2019-05-23 DIAGNOSIS — Z8249 Family history of ischemic heart disease and other diseases of the circulatory system: Secondary | ICD-10-CM | POA: Insufficient documentation

## 2019-05-23 DIAGNOSIS — Z87891 Personal history of nicotine dependence: Secondary | ICD-10-CM | POA: Diagnosis not present

## 2019-05-23 DIAGNOSIS — Z794 Long term (current) use of insulin: Secondary | ICD-10-CM | POA: Diagnosis not present

## 2019-05-23 DIAGNOSIS — E1142 Type 2 diabetes mellitus with diabetic polyneuropathy: Secondary | ICD-10-CM | POA: Insufficient documentation

## 2019-05-23 DIAGNOSIS — E1122 Type 2 diabetes mellitus with diabetic chronic kidney disease: Secondary | ICD-10-CM | POA: Diagnosis not present

## 2019-05-23 DIAGNOSIS — Z79899 Other long term (current) drug therapy: Secondary | ICD-10-CM | POA: Insufficient documentation

## 2019-05-23 DIAGNOSIS — I130 Hypertensive heart and chronic kidney disease with heart failure and stage 1 through stage 4 chronic kidney disease, or unspecified chronic kidney disease: Secondary | ICD-10-CM | POA: Insufficient documentation

## 2019-05-23 NOTE — Progress Notes (Signed)
PCP: [facility] Dr Bradd Burner Primary Cardiologist: Dr Aundra Dubin  Structural Heart Team: Dr Cyndia Bent, Dr Burt Knack   HPI: [name] a 73 year old gentleman with chronic kidney diseasebaseline creatinine ~2.5), morbid obesity, RV dysfunction, and blindness. He has undergone permanent pacemaker placement in the past.In Jan 2020 he was hospitalized with acute on chronic diastolic CHF in the setting of severe AS. His hospitalization was complicated by AKI and was felt at that time not to be a good candidate for TAVR due to morbid obesity, poor functional capacity, chronic deconditioning, RV dysfunction and blindness.   He was seen back by Dr. Burt Knack 03/09/2019 in the office and had ongoing SOB. He was set up for right and left heart catheterization as part of his evaluationon 04/10/2019 showing diffuse calcific non obstuctive CAD and moderately severe AS with moderate PHTN with mean PAP 75mHg. He was set up to see Dr. BCyndia Bentin consult for TAVR [facility] of increasing SOB.   Admitted with A/C diastolic HF. Diuresed with IV lasix and once stable he had TAVR. Post op course relatively uncomplicated. He was discharged on torsemide 120 mg twice a day + weekly metolazone. He remained on 3 liters nasal cannula oxygen.  He was discharged to SNF for ongoing rehab.     Today he returns for post HF follow up from SNF. He arrived without medication list. .Overall feeling fine. Remains SOB with exertion but says its a little better. Denies PND/Orthopnea.No chest pain. Working with PT. Appetite ok. No fever or chills. He does not know what his weight is. Taking all medications provided by SNF.   PMH: 1. Chronic diastolic CHF with RV dysfunction: Echo (2/15) with EF 60%, mild LVH, mild aortic stenosis with mean gradient 18 mmHg, RV mildly dilated with mildly decreased systolic function.  - Echo (10/18): EF 60-65%, moderate aortic stenosis.  - Echo (8/19): EF 60-65%, mild LVH,  moderate AS with mean gradient 29 mmHg/AVA 1.1 cm^2, PASP 45, normal RV size with mildly decreased systolic function. - TEE (1/20): EF 55-60%, moderate LVH, AVA 0.5 cm^2 by planimetry with mean gradient 35 mmHg, AVA 1.0 cm^2 continuity equation.  - RHC (1/20): mean RA 15, PA 68/23 mean 44, mean PCWP 20, CI 3.8, PVR 3 WU 2. Aortic stenosis:Suspect severe at this point.  - TEE (1/20): EF 55-60%, moderate LVH, AVA 0.5 cm^2 by planimetry with mean gradient 35 mmHg, AVA 1.0 cm^2 continuity equation. 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 Type II diabetes 4. 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  - 3.8 cm ascending aorta on 8/19 echo.  5.OHS/OSA: Uses CPAPand oxygen during the day.  6. Morbid obesity.  7. HTN 8. CAD: Nonobstructive. [id number] with 60% LAD stenosis. 9. 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  10. Atrial fibrillation: Paroxysmal. Possible amiodarone lung toxicity =>now off.  11. CKD stage III 12. Macular degeneration: Legally blind.  13. 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  14. 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 15. S/p left foot amputation 16. Diabetic peripheral neuropathy.  111 H/o atrial tachycardia   ROS: All systems negative except as listed in HPI, PMH and Problem List.  SH:  Social History   Socioeconomic History  . Marital status: Divorced    Spouse name: Not on file  . Number of children: 4  . Years of education: 147 . Highest education level: 12th grade  Occupational History  . Occupation: disabled  Social Needs  .  Financial resource strain: Somewhat hard  . Food insecurity    Worry: Sometimes true    Inability: Sometimes true  . Transportation needs    Medical: No    Non-medical: Yes  Tobacco Use  . Smoking status: Former Smoker    Packs/day: 0.00    Years: 0.00    Pack years: 0.00    Types: Cigarettes    Quit date: 08/17/2003    Years since quitting: 15.7  . Smokeless tobacco: Never  Used  . Tobacco comment: started at age 27.  only smoked on occ- very rarely.0  Substance and Sexual Activity  . Alcohol use: Yes    Comment: 06/23/2013 "drank some; never had a problem w/it"  . Drug use: No  . Sexual activity: Never  Lifestyle  . Physical activity    Days per week: Not on file    Minutes per session: Not on file  . Stress: Not on file  Relationships  . Social Herbalist on phone: Not on file    Gets together: Not on file    Attends religious service: Not on file    Active member of club or organization: Not on file    Attends meetings of clubs or organizations: Not on file    Relationship status: Not on file  . Intimate partner violence    Fear of current or ex partner: Not on file    Emotionally abused: Not on file    Physically abused: Not on file    Forced sexual activity: Not on file  Other Topics Concern  . Not on file  Social History Narrative   12/10- reports issues with housing- states it is not disability friendly but has ramp, first floor apt, and has grab bars/assistive devices provided by [facility].  Reports that he has concerns with having enough food but gets meals on wheels, eats at [facility] 3x a week, and has personal aid who cooks and shops for him.  Reports concerns with transportation but gets [facility] transport for all of his medical appts and his family or an aid helps him with other transportation needs.    FH:  Family History  Problem Relation Age of Onset  . Coronary artery disease Sister   . Heart disease Sister   . Allergies Sister   . Asthma Brother   . Rheum arthritis Brother     Past Medical History:  Diagnosis Date  . Arthritis    "all over" (06/23/2013)  . Atrial fibrillation ([place])    not Candidate for Coumadin d/t rectal bleeding  . Benign prostatic hypertrophy   . Cardiomyopathy, hypertensive ([place])     diastolic dysfunction by 2993 echo  . Coronary artery disease     nonobstructive. 2 vessel. cardiac catheter in March  2005 showing 60% LAD occlusion, 30% circumflex occlusion.  . Degenerative joint disease     Right knee.  . Diabetes type 2, uncontrolled ([place])   . Diabetic peripheral neuropathy ([place])   . Diabetic ulcer of thigh ([place])    H/O Rt thigh ulcer.  . Exertional shortness of breath    "comes and goes" (06/23/2013)  . GERD (gastroesophageal reflux disease)   . Gout   . Herpes   . Hyperlipidemia   . Hypertension   . Hypotension 09/05/2013  . Incarcerated ventral hernia    H/O. S/P repair ventral hernia repair 07/2005.  Marland Kitchen LBBB (left bundle branch block)   . Legally blind   . Morbid obesity ([place])   .  Obstructive sleep apnea    On CPAP. Last sleep study (06/2009) - Severe obstructive sleep apnea/hypopnea syndrome, apnea-hypopnea index 70.5 per hour with non positional events moderately loud snoring and oxygen desaturation to a nadir of 85% on room air.  . Occult blood positive stool     History of in August 2007.  colonoscopy in January 2008 showing normal colon with fair inadequate prep. By Dr. Silvano Rusk.  . 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 Sepsis ([place]) 08/26/2013    Current Outpatient Medications  Medication Sig Dispense Refill  . acetaminophen (TYLENOL) 650 MG CR tablet Take 1,300 mg by mouth 2 (two) times daily.     Marland Kitchen albuterol (PROVENTIL HFA;VENTOLIN HFA) 108 (90 Base) MCG/ACT inhaler Inhale 2 puffs into the lungs every 6 (six) hours as needed for wheezing or shortness of breath.    Marland Kitchen amLODipine (NORVASC) 10 MG tablet Take 10 mg by mouth daily. Reported on 12/09/2015    . antiseptic oral rinse (BIOTENE) LIQD 15 mLs by Mouth Rinse route 3 (three) times daily as needed for dry mouth.     Marland Kitchen aspirin EC 81 MG tablet Take 1 tablet (81 mg total) by mouth daily. 90 tablet 3  . cholecalciferol (VITAMIN D) 1000 units tablet Take 1,000 Units by mouth daily.    . citalopram (CELEXA) 20 MG tablet Take 20 mg by mouth daily.    . clopidogrel (PLAVIX) 75 MG tablet Take 1 tablet (75 mg  total) by mouth daily.    . famotidine (PEPCID) 20 MG tablet Take 20 mg by mouth daily.    . Febuxostat (ULORIC) 80 MG TABS Take 80 mg by mouth daily.     Marland Kitchen gabapentin (NEURONTIN) 300 MG capsule Take 300 mg by mouth 2 (two) times daily.    . hydrALAZINE (APRESOLINE) 25 MG tablet Take 3 tablets (75 mg total) by mouth 3 (three) times daily.    . insulin aspart (NOVOLOG) 100 UNIT/ML injection Inject 0-20 Units into the skin 3 (three) times daily with meals. 10 mL 11  . insulin glargine (LANTUS) 100 UNIT/ML injection Inject 0.25 mLs (25 Units total) into the skin at bedtime. (Patient taking differently: Inject 90 Units into the skin at bedtime. )    . Liraglutide (VICTOZA) 18 MG/3ML SOPN Inject 1.8 mg into the skin daily.    . Menthol, Topical Analgesic, (BIOFREEZE EX) Apply 1 application topically 2 (two) times daily as needed (knee/shoulder pain).     Marland Kitchen metolazone (ZAROXOLYN) 2.5 MG tablet Take 1 tablet (2.5 mg total) by mouth once a week. Every Friday    . metoprolol succinate (TOPROL-XL) 50 MG 24 hr tablet Take 1 tablet (50 mg total) by mouth daily. Take with or immediately following a meal. 30 tablet 3  . polyethylene glycol (MIRALAX / GLYCOLAX) packet Take 17 g by mouth daily as needed for moderate constipation.    . potassium chloride SA (K-DUR) 20 MEQ tablet Take 2 tablets (40 mEq total) by mouth 2 (two) times daily.    . rosuvastatin (CRESTOR) 20 MG tablet Take 20 mg by mouth daily.    . sennosides-docusate sodium (SENOKOT-S) 8.6-50 MG tablet Take 2 tablets by mouth 2 (two) times daily.     . Skin Protectants, Misc. (EUCERIN) cream Apply 1 application topically 2 (two) times daily.    Marland Kitchen torsemide (DEMADEX) 20 MG tablet Take 6 tablets (120 mg total) by mouth 2 (two) times daily. (Patient taking differently: Take 20 mg by mouth 2 (  two) times daily. )     No current facility-administered medications for this encounter.    Facility-Administered Medications Ordered in Other Encounters   Medication Dose Route Frequency Provider Last Rate Last Dose  . chlorhexidine gluconate (MEDLINE KIT) (PERIDEX) 0.12 % solution 15 mL  15 mL Mouth Rinse BID Ryan Mires, MD      . MEDLINE mouth rinse  15 mL Mouth Rinse QID Ryan Mires, MD        Vitals:   05/23/19 1024  BP: (!) 147/86  Pulse: 77  SpO2: 92%  Weight: (!) 142.4 kg (314 lb)   Wt Readings from Last 3 Encounters:  05/23/19 (!) 142.4 kg (314 lb)  05/11/19 (!) 144.4 kg (318 lb 5.5 oz)  04/10/19 (!) 141.1 kg (311 lb)    PHYSICAL EXAM: General:  Appears chronically. Arrived in wheel chair on oxygen. No resp difficulty HEENT: normal Neck: supple. JVP 6-7. Carotids 2+ bilaterally; no bruits. No lymphadenopathy or thryomegaly appreciated. Cor: PMI normal. Regular rate & rhythm. No rubs, gallops or murmurs. Lungs: clear Abdomen: soft, nontender, nondistended. No hepatosplenomegaly. No bruits or masses. Good bowel sounds. Extremities: no cyanosis, clubbing, rash, R and LLE 1+ edema Neuro: alert & orientedx3, cranial nerves grossly intact. Moves all 4 extremities w/o difficulty. Affect pleasant.      ASSESSMENT & PLAN: 1.Chronic diastolic CHF with prominent RV failure:Volume difficult to manage in setting of RV failure (OHS/OSA), CKD, and severe AS. Echo 04/2019  EF 60-65%, RV reportedly normal, mod-severe aortic stenosis with mean gradient 30 mmHg. He was admitted with increased dyspnea (at rest). Creatinine noted to be worse recent  admission, suspect contrast nephropathy from recent cath as well as cardiorenal. Now s/p TAVR on 9/22.  -NYHA III. Volume status stable. Continue current dose of torsemide and metolazone. We have requested lab work.  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 - We will set upPYP scan and send myeloma panel. Need to check with [facility]. 2. Aortic stenosis:Successful TAVR with Edwards Sapien 3 valve on 9/22. Post-TAVR echo stable with mean gradient 13  mmHg, no AI.  - Continue ASA 81 + Plavix 75.  - No reported bleeding issues.  3. Atrial fibrillation:Maintained NSR and had very low voltage Ps (regular rhythm) alternating with a-pacing. He is not anticoagulated (takes ASA) due to prior intracerebral hemorrhage. 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 - Continue Toprol XL 50 mg daily.  4. CAD: Nonobstructive on 8/20 cath. No chest pain.  - Continue ASA and statin.  5. 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  6. CKD stage III: creatinine 1.97 prior, up to 2.78 at admission. May have had contrast nephropathy from recent cath, also suspect cardiorenal syndrome. Creatinine at discharge 3 today. He will need BMET next week at SNF.  7.OHS/OSA: Continue nightly CPAPand home oxygen. He will need 3 liters nasal cannula continuously.  8. DM: Per PCP 9. Deconditioning - PT--> SNF.   Follow up in 3 months with Dr Aundra Dubin. Today he was not sent with any information from SNF. We have attempted to call for med list and and lab work.   [name] NP-C  8:21 PM

## 2019-05-23 NOTE — Patient Instructions (Signed)
Please have CPAP fixed.  Please Fax Recent BMET results to [phone].  Please follow up with the [facility] in 3 months. We currently do not have that schedule. Please call us in November/ December 2020 in order to schedule your appointment with Dr. Aundra Dubin in January 2021. Please call [phone] option #3.   At the [facility], you and your health needs are our priority. As part of our continuing mission to provide you with exceptional heart care, we have created designated Provider Care Teams. These Care Teams include your primary Cardiologist (physician) and Advanced Practice Providers (APPs- Physician Assistants and Nurse Practitioners) who all work together to provide you with the care you need, when you need it.   You may see any of the following providers on your designated Care Team at your next follow up: Marland Kitchen Dr Glori Bickers . Dr Loralie Champagne . Darrick Grinder, NP   Please be sure to bring in all your medications bottles to every appointment.

## 2019-05-24 IMAGING — DX DG CHEST 2V
2 series · 2 of 2 positions shown · non-contrast
Comparison: PA and lateral chest x-ray January 25, 2017

CLINICAL DATA: Four months of cough and chest pain. History of
cardiomyopathy, coronary artery disease, atrial fibrillation,
diabetes, morbid obesity, and permanent pacemaker. Former smoker.

EXAM:
CHEST  2 VIEW

[chest pa]
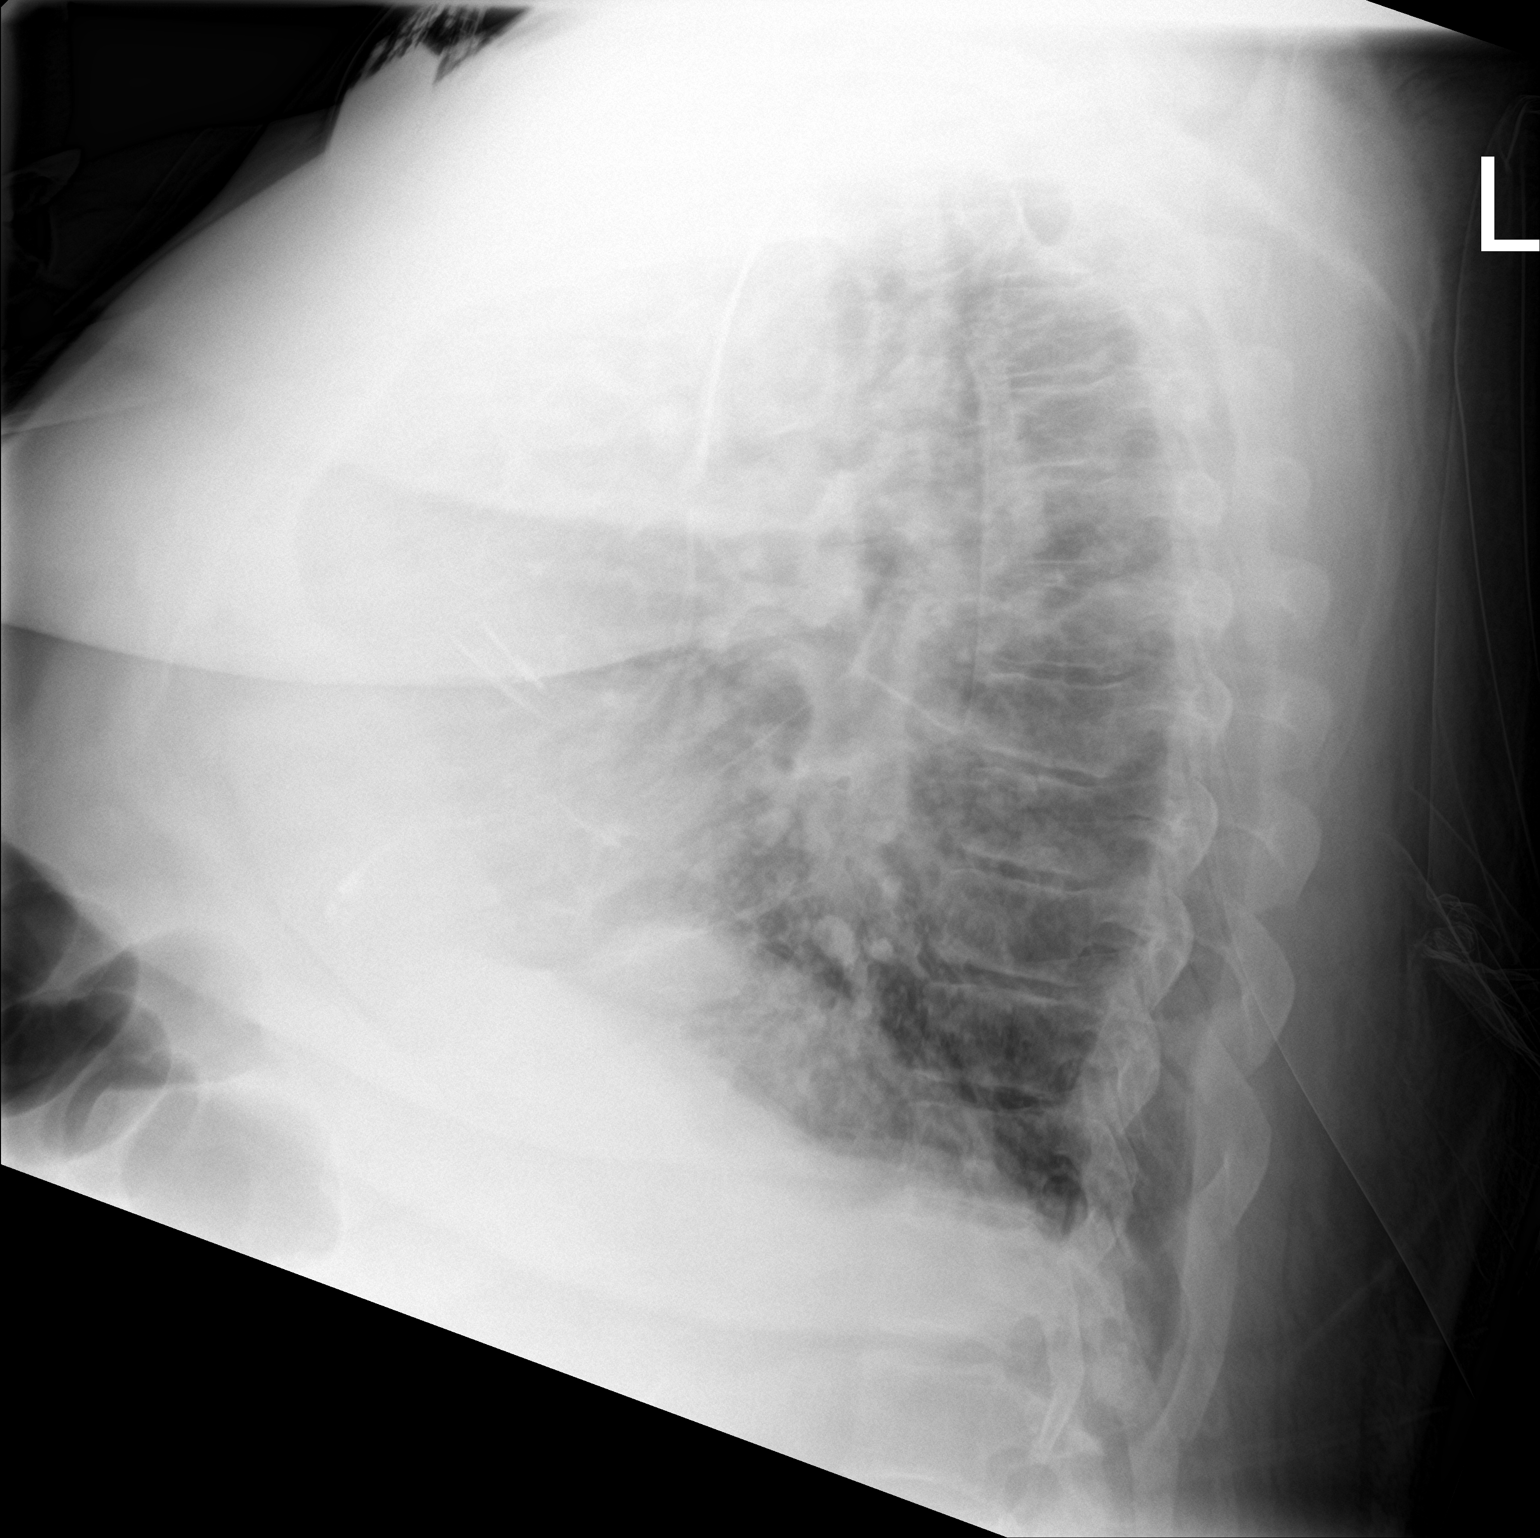

[chest ap]
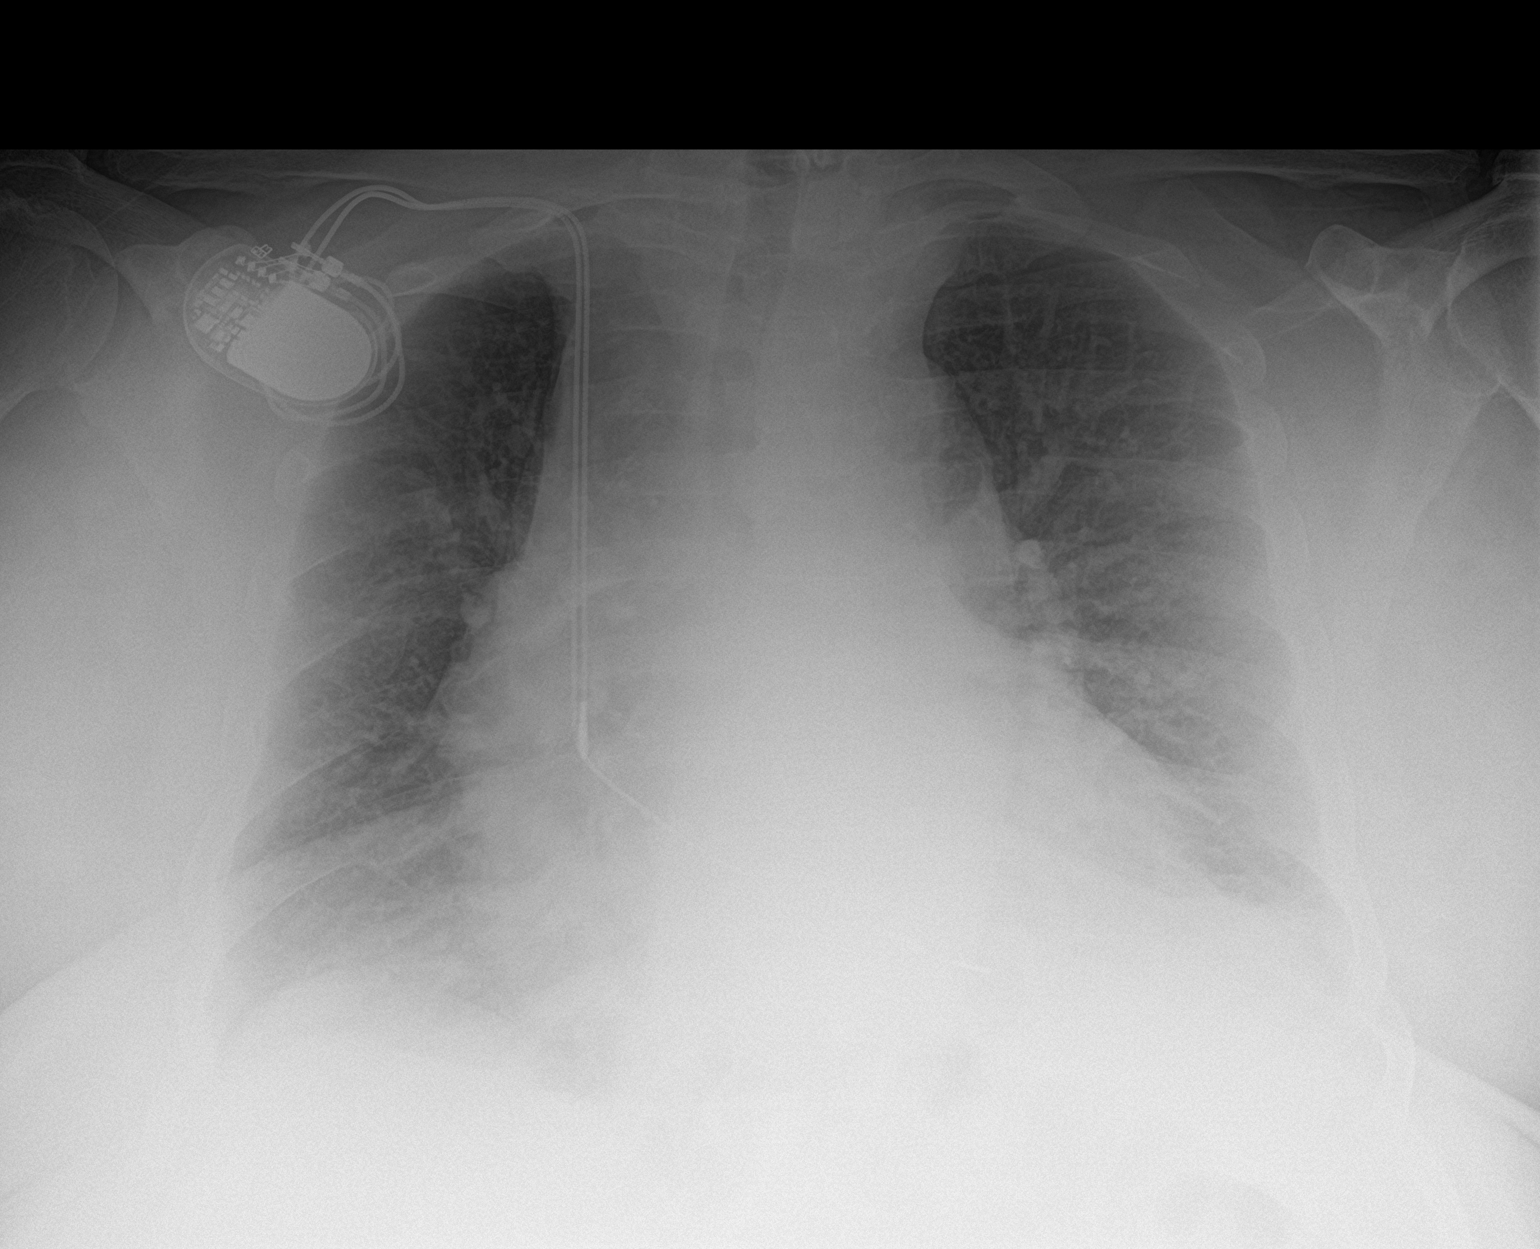

[2 of 2 positions shown; findings below may reference images not displayed]

FINDINGS: The lungs are adequately inflated. The interstitial markings remain
increased. The cardiac silhouette remains enlarged. The pulmonary
vascularity is engorged and more prominent today. There is no
pleural effusion. The ICD is in stable position. The bony thorax
exhibits no acute abnormality.
IMPRESSION: CHF superimposed upon chronic bronchitic changes slightly worse
since the previous study. There is no alveolar pneumonia nor
significant pleural effusion.

## 2019-06-07 ENCOUNTER — Ambulatory Visit (INDEPENDENT_AMBULATORY_CARE_PROVIDER_SITE_OTHER): Payer: Medicare (Managed Care) | Admitting: Physician Assistant

## 2019-06-07 ENCOUNTER — Other Ambulatory Visit: Payer: Self-pay

## 2019-06-07 ENCOUNTER — Ambulatory Visit (HOSPITAL_COMMUNITY): Payer: Medicare (Managed Care) | Attending: Cardiology

## 2019-06-07 ENCOUNTER — Encounter: Payer: Self-pay | Admitting: Physician Assistant

## 2019-06-07 VITALS — BP 142/70 | HR 71 | Ht 72.0 in | Wt 312.0 lb

## 2019-06-07 DIAGNOSIS — Z952 Presence of prosthetic heart valve: Secondary | ICD-10-CM

## 2019-06-07 MED ORDER — AMOXICILLIN 500 MG PO CAPS: ORAL_CAPSULE | ORAL | 5 refills | Status: DC

## 2019-06-07 NOTE — Progress Notes (Signed)
[facility] [place]                                       Cardiology Office Note    Date:  06/07/2019   ID:  [name], DOB [birth year], MRN [id number]  PCP:  Ryan Adie, MD  Cardiologist:  Dr. Aundra Fletcher   CC: 1 month s/p TAVR  History of Present Illness:  [name] is a 73 y.o. male with a history of CKD stage IV, uncontrolled DM with neuropathy, PAD s/p left transmetatarsal amputation, morbid obesity, atrial fibrillation not on anticoagulation due to GI and intracranial bleeding, previous intracranial bleeding (2015), HTN, HLD, OSA, chronic diastolic CHF with RV dysfunction, complete blindness, s/p PPM, and severe AS s/p TAVR ( 05/08/19) who presents to clinic for follow up.   He had an echocardiogram done in June 2020 which showed an ejection fraction of 60 to 65% with severe LVH.  The mean gradient across the aortic valve was 32 mmHg with an aortic valve area of 1.0 cm. According to the patient and his sister who is here with him he has had longstanding exertional shortness of breath and fatigue.  He was seen by Dr. Burt Fletcher for consideration of TAVR. He underwent cardiac catheterization on 04/10/2019 which showed diffuse calcific nonobstructive coronary disease.  He had moderate pulmonary hypertension with a mean PA pressure of 41 mmHg. He subsequently underwent CT scans for TAVR evaluation on 04/24/2019.  After these scans, he developed worsening shortness of breath and peripheral edema with a rise in his creatinine to 2.78 from his baseline of 2.08 prior to his catheterization.  He was admitted with acute on chronic diastolic congestive heart failure on 04/28/2019. He was diuresed with IV lasix and once stable he underwent TAVR with a 29 mm Edwards Sapien 3 THV via the TF approach. Post op echo showed EF 65-70%, normally functioning TAVR with a mean gradient of 13.3 mm Hg and no PVL. He was discharged on aspirin and plavix. He was  discharged to a SNF for ongoing rehab.   He has done well in follow up and followed closely by the advanced CHF clinic. He has continued with persistent dyspnea on exertion. Given LVH, chronic diastolic CHF and aortic stenosis, cardiac amyloidosis is a concern. There was some discussion about a PYP scan and MM panel.   Today he presents to clinic for follow up. Doing well. Still at SNF. Has chronic LE edema which is unchanged. Still has shortness of breath that may be marginally better. No CP No orthopnea or PND. No dizziness or syncope. No blood in stool or urine. No palpitations.     Past Medical History:  Diagnosis Date   Arthritis    "all over" (06/23/2013)   Atrial fibrillation (HCC)    not Candidate for Coumadin d/t rectal bleeding   Benign prostatic hypertrophy    Cardiomyopathy, hypertensive ([place])     diastolic dysfunction by 8119 echo   Coronary artery disease     nonobstructive. 2 vessel. cardiac catheter in March 2005 showing 60% LAD occlusion, 30% circumflex occlusion.   Degenerative joint disease     Right knee.   Diabetes type 2, uncontrolled ([place])    Diabetic peripheral neuropathy ([place])    Diabetic ulcer of thigh ([place])    H/O Rt thigh ulcer.   Exertional shortness of breath    "  comes and goes" (07-02-13)   GERD (gastroesophageal reflux disease)    Gout    Herpes    Hyperlipidemia    Hypertension    Hypotension 09/05/2013   Incarcerated ventral hernia    H/O. S/P repair ventral hernia repair 07/2005.   LBBB (left bundle branch block)    Legally blind    Morbid obesity ([place])    Obstructive sleep apnea    On CPAP. Last sleep study (06/2009) - Severe obstructive sleep apnea/hypopnea syndrome, apnea-hypopnea index 70.5 per hour with non positional events moderately loud snoring and oxygen desaturation to a nadir of 85% on room air.   Occult blood positive stool     History of in August 2007.  colonoscopy in January 2008 showing normal colon  with fair inadequate prep. By Dr. Silvano Fletcher.   Pacemaker 2003/10/30    secondary to bradycardia   Sepsis ([place]) 08/26/2013    Past Surgical History:  Procedure Laterality Date   AMPUTATION Left 06/22/2013   Procedure: AMPUTATION FOOT;  Surgeon: Ryan Minion, MD;  Location: [facility];  Service: Orthopedics;  Laterality: Left;  Left Midfoot Amputation   CARDIAC CATHETERIZATION  10/22/2003   Dilatation of the LV with preserved LV systolic function. Tortous aorta, but no AAA. Moderate disease with 50-60% area of narrowing in the circumflex after the second OM and 70% mid narrowing in the LAD. No intervention.   CARDIOVASCULAR STRESS TEST  01/23/2010   Perfusion defect seen in inferior myocardial region is consistent with diaphragmatic attenuation. Remaining myocardium demonstrates normal myocardial perfusion with no evidence of ischemia or infarct. Stress ECG was non-diagnostic due to LBBB.   CATARACT EXTRACTION, BILATERAL Bilateral    ESOPHAGOGASTRODUODENOSCOPY N/A 10/12/2013   Procedure: ESOPHAGOGASTRODUODENOSCOPY (EGD);  Surgeon: Ryan Ober, MD;  Location: [facility] ENDOSCOPY;  Service: General;  Laterality: N/A;  bedside   EYE SURGERY     FOOT AMPUTATION THROUGH METATARSAL Left 06/22/2013   midfoot/notes 06/22/2013   HERNIA REPAIR     INSERT / REPLACE / REMOVE PACEMAKER  10-30-2003   Secondary to symptomatic bradycardia. DDDR pacemaker. By Dr. Rollene Fletcher.   INSERT / REPLACE / REMOVE PACEMAKER  10-29-2012   "old pacemaker died; had new one put in" (2013-07-02)   Pacemaker lead revision  12/2003    likely microdislodgment  of right ventricular lead   PEG PLACEMENT N/A 10/12/2013   Procedure: PERCUTANEOUS ENDOSCOPIC GASTROSTOMY (PEG) PLACEMENT;  Surgeon: Ryan Ober, MD;  Location: [place];  Service: General;  Laterality: N/A;   PERMANENT PACEMAKER GENERATOR CHANGE N/A 11/03/2012   Procedure: PERMANENT PACEMAKER GENERATOR CHANGE;  Surgeon: Ryan Klein, MD;  Location: [place] CATH LAB;  Service:  Cardiovascular;  Laterality: N/A;   REFRACTIVE SURGERY Bilateral    RIGHT HEART CATH N/A 09/05/2018   Procedure: RIGHT HEART CATH;  Surgeon: Ryan Dresser, MD;  Location: [place] CV LAB;  Service: Cardiovascular;  Laterality: N/A;   RIGHT/LEFT HEART CATH AND CORONARY ANGIOGRAPHY N/A 04/10/2019   Procedure: RIGHT/LEFT HEART CATH AND CORONARY ANGIOGRAPHY;  Surgeon: [name], MD;  Location: [place] CV LAB;  Service: Cardiovascular;  Laterality: N/A;   TEE WITHOUT CARDIOVERSION N/A 09/05/2018   Procedure: TRANSESOPHAGEAL ECHOCARDIOGRAM (TEE);  Surgeon: Ryan Dresser, MD;  Location: [facility] ENDOSCOPY;  Service: Cardiovascular;  Laterality: N/A;   TEE WITHOUT CARDIOVERSION N/A 05/08/2019   Procedure: TRANSESOPHAGEAL ECHOCARDIOGRAM (TEE);  Surgeon: [name], MD;  Location: [place] CV LAB;  Service: Open Heart Surgery;  Laterality: N/A;   TRANSCATHETER AORTIC VALVE  REPLACEMENT, TRANSFEMORAL N/A 05/08/2019   Procedure: TRANSCATHETER AORTIC VALVE REPLACEMENT, TRANSFEMORAL;  Surgeon: [name], MD;  Location: [place] CV LAB;  Service: Open Heart Surgery;  Laterality: N/A;   TRANSTHORACIC ECHOCARDIOGRAM  01/01/2013   EF 50-55%, ventricular septum-thickness moderately increased   VENOUS DOPPLER  01/17/2013   No evidence of thrombus or thrombophlebitis. Left GSVs appear enlarged and demonstrate valvular insuffiiciency. Bilateral SSVs appeared patent with no valvular insufficiency seen.   VENTRAL HERNIA REPAIR  07/2005    S/P laparotomy, lysis of adhesions, repair of incarcerated ventral hernia with  partial omentectomy.. Performed by Dr. Marlou Starks.    Current Medications: Outpatient Medications Prior to Visit  Medication Sig Dispense Refill   acetaminophen (TYLENOL) 650 MG CR tablet Take 1,300 mg by mouth 2 (two) times daily.      albuterol (PROVENTIL HFA;VENTOLIN HFA) 108 (90 Base) MCG/ACT inhaler Inhale 2 puffs into the lungs every 6 (six) hours as needed for wheezing  or shortness of breath.     amLODipine (NORVASC) 10 MG tablet Take 10 mg by mouth daily. Reported on 12/09/2015     antiseptic oral rinse (BIOTENE) LIQD 15 mLs by Mouth Rinse route 3 (three) times daily as needed for dry mouth.      aspirin EC 81 MG tablet Take 1 tablet (81 mg total) by mouth daily. 90 tablet 3   cholecalciferol (VITAMIN D) 1000 units tablet Take 1,000 Units by mouth daily.     citalopram (CELEXA) 20 MG tablet Take 20 mg by mouth daily.     clopidogrel (PLAVIX) 75 MG tablet Take 1 tablet (75 mg total) by mouth daily.     famotidine (PEPCID) 20 MG tablet Take 20 mg by mouth daily.     Febuxostat (ULORIC) 80 MG TABS Take 80 mg by mouth daily.      gabapentin (NEURONTIN) 300 MG capsule Take 300 mg by mouth 2 (two) times daily.     hydrALAZINE (APRESOLINE) 25 MG tablet Take 3 tablets (75 mg total) by mouth 3 (three) times daily.     insulin aspart (NOVOLOG) 100 UNIT/ML injection Inject 0-20 Units into the skin 3 (three) times daily with meals. 10 mL 11   insulin glargine (LANTUS) 100 UNIT/ML injection Inject 0.25 mLs (25 Units total) into the skin at bedtime. (Patient taking differently: Inject 90 Units into the skin at bedtime. )     Liraglutide (VICTOZA) 18 MG/3ML SOPN Inject 1.8 mg into the skin daily.     Menthol, Topical Analgesic, (BIOFREEZE EX) Apply 1 application topically 2 (two) times daily as needed (knee/shoulder pain).      metolazone (ZAROXOLYN) 2.5 MG tablet Take 1 tablet (2.5 mg total) by mouth once a week. Every Friday     metoprolol succinate (TOPROL-XL) 50 MG 24 hr tablet Take 1 tablet (50 mg total) by mouth daily. Take with or immediately following a meal. 30 tablet 3   polyethylene glycol (MIRALAX / GLYCOLAX) packet Take 17 g by mouth daily as needed for moderate constipation.     potassium chloride SA (K-DUR) 20 MEQ tablet Take 2 tablets (40 mEq total) by mouth 2 (two) times daily.     rosuvastatin (CRESTOR) 20 MG tablet Take 20 mg by mouth  daily.     sennosides-docusate sodium (SENOKOT-S) 8.6-50 MG tablet Take 2 tablets by mouth 2 (two) times daily.      Skin Protectants, Misc. (EUCERIN) cream Apply 1 application topically 2 (two) times daily.     torsemide (DEMADEX) 20 MG  tablet Take 6 tablets (120 mg total) by mouth 2 (two) times daily. (Patient taking differently: Take 20 mg by mouth 2 (two) times daily. )     Facility-Administered Medications Prior to Visit  Medication Dose Route Frequency Provider Last Rate Last Dose   chlorhexidine gluconate (MEDLINE KIT) (PERIDEX) 0.12 % solution 15 mL  15 mL Mouth Rinse BID Chesley Mires, MD       MEDLINE mouth rinse  15 mL Mouth Rinse QID Chesley Mires, MD         Allergies:   Patient has no known allergies.   Social History   Socioeconomic History   Marital status: Divorced    Spouse name: Not on file   Number of children: 4   Years of education: 12   Highest education level: 12th grade  Occupational History   Occupation: disabled  Scientist, product/process development strain: Somewhat hard   Food insecurity    Worry: Sometimes true    Inability: Sometimes true   Transportation needs    Medical: No    Non-medical: Yes  Tobacco Use   Smoking status: Former Smoker    Packs/day: 0.00    Years: 0.00    Pack years: 0.00    Types: Cigarettes    Quit date: 08/17/2003    Years since quitting: 15.8   Smokeless tobacco: Never Used   Tobacco comment: started at age 66.  only smoked on occ- very rarely.0  Substance and Sexual Activity   Alcohol use: Yes    Comment: 06/23/2013 "drank some; never had a problem w/it"   Drug use: No   Sexual activity: Never  Lifestyle   Physical activity    Days per week: Not on file    Minutes per session: Not on file   Stress: Not on file  Relationships   Social connections    Talks on phone: Not on file    Gets together: Not on file    Attends religious service: Not on file    Active member of club or organization: Not  on file    Attends meetings of clubs or organizations: Not on file    Relationship status: Not on file  Other Topics Concern   Not on file  Social History Narrative   12/10- reports issues with housing- states it is not disability friendly but has ramp, first floor apt, and has grab bars/assistive devices provided by [employer].  Reports that he has concerns with having enough food but gets meals on wheels, eats at [facility] 3x a week, and has personal aid who cooks and shops for him.  Reports concerns with transportation but gets [facility] transport for all of his medical appts and his family or an aid helps him with other transportation needs.     Family History:  The patient's family history includes Allergies in his sister; Asthma in his brother; Coronary artery disease in his sister; Heart disease in his sister; Rheum arthritis in his brother.     ROS:   Please see the history of present illness.    ROS All other systems reviewed and are negative.   PHYSICAL EXAM:   VS:  BP (!) 142/70    Pulse 71    Ht 6' (1.829 m)    Wt (!) 312 lb (141.5 kg)    SpO2 96%    BMI 42.31 kg/m    GEN: Well nourished, well developed, in no acute distress, obese HEENT: normal Neck: no JVD or masses  Cardiac: RRR; soft flow murmur. No rubs, or gallops 1+ bilateral LE edema wearing compression stockings.  Respiratory:  clear to auscultation bilaterally, normal work of breathing GI: soft, nontender, nondistended, + BS MS: no deformity or atrophy Skin: warm and dry, no rash.  Groin sites clear without hematoma or ecchymosis  Neuro:  Alert and Oriented x 3, Strength and sensation are intact Psych: euthymic mood, full affect   Wt Readings from Last 3 Encounters:  06/07/19 (!) 312 lb (141.5 kg)  05/23/19 (!) 314 lb (142.4 kg)  05/11/19 (!) 318 lb 5.5 oz (144.4 kg)      Studies/Labs Reviewed:   EKG:  EKG is NOT ordered today.    Recent Labs: 04/28/2019: TSH 2.939 05/07/2019: ALT 23; B Natriuretic Peptide  115.7 05/09/2019: Magnesium 2.0 05/11/2019: BUN 80; Creatinine, Ser 2.99; Hemoglobin 9.7; Platelets 168; Potassium 3.5; Sodium 134   Lipid Panel    Component Value Date/Time   CHOL 115 03/21/2018 1044   CHOL 129 01/05/2017 1235   TRIG 80 03/21/2018 1044   HDL 33 (L) 03/21/2018 1044   HDL 36 (L) 01/05/2017 1235   CHOLHDL 3.5 03/21/2018 1044   VLDL 16 03/21/2018 1044   LDLCALC 66 03/21/2018 1044   LDLCALC 71 01/05/2017 1235    Additional studies/ records that were reviewed today include:  TAVR OPERATIVE NOTE   Date of Procedure:                05/08/2019  Preoperative Diagnosis:      Severe Aortic Stenosis   Postoperative Diagnosis:    Same   Procedure:        Transcatheter Aortic Valve Replacement - Percutaneous Right Transfemoral Approach             Edwards Sapien 3 Ultra THV (size 29 mm, model # 9750TFX, serial # [id number])              Co-Surgeons:                        Gaye Pollack, MD and [name], MD   Anesthesiologist:                  Elio Forget, MD  Echocardiographer:              Bertrum Sol, MD  Pre-operative Echo Findings: ? Severe aortic stenosis ? Normal left ventricular systolic function  Post-operative Echo Findings: ? No paravalvular leak ? Normal left ventricular systolic function  __________________  Echo 05/09/19 IMPRESSIONS  1. Left ventricular ejection fraction, by visual estimation, is 65 to 70%. The left ventricle has hyperdynamic function. Normal left ventricular size. There is moderately increased left ventricular hypertrophy.  2. Elevated mean left atrial pressure.  3. Left ventricular diastolic Doppler parameters are consistent with impaired relaxation pattern of LV diastolic filling.  4. Global right ventricle has normal systolic function.The right ventricular size is normal. No increase in right ventricular wall thickness.  5. Left atrial size was moderately dilated.  6. Right atrial size was normal.  7. The  mitral valve is normal in structure. Trace mitral valve regurgitation. No evidence of mitral stenosis.  8. The tricuspid valve is normal in structure. Tricuspid valve regurgitation was not visualized by color flow Doppler.  9. Aortic valve area, by VTI measures 1.43 cm. 10. Aortic valve mean gradient measures 13.3 mmHg. 11. Aortic valve peak gradient measures 23.6 mmHg. 12. Aortic valve regurgitation was not visualized by color flow  Doppler. Structurally normal aortic valve, with no evidence of sclerosis or stenosis. 25. TAVR 57m Edwards, no regurgitation. 14. The pulmonic valve was normal in structure. Pulmonic valve regurgitation is not visualized by color flow Doppler. 15. The inferior vena cava is dilated in size with >50% respiratory variability, suggesting right atrial pressure of 8 mmHg.  __________________  Echo 06/07/19 IMPRESSIONS  1. Left ventricular ejection fraction, by visual estimation, is 55 to 60%. The left ventricle has normal function. Normal left ventricular size. There is no left ventricular hypertrophy.  2. Global right ventricle has normal systolic function.The right ventricular size is mildly enlarged. No increase in right ventricular wall thickness.  3. Left atrial size was mildly dilated.  4. Right atrial size was normal.  5. The mitral valve is normal in structure. No evidence of mitral valve regurgitation. No evidence of mitral stenosis.  6. The tricuspid valve is normal in structure. Tricuspid valve regurgitation was not visualized by color flow Doppler.  7. Aortic valve mean gradient measures 11.6 mmHg.  8. Aortic valve peak gradient measures 18.1 mmHg.  9. Aortic valve regurgitation was not visualized by color flow Doppler. Structurally normal aortic valve, with no evidence of sclerosis or stenosis. 10. Edwards Sapien 3 Ultra THV (size 29 mm, model # 9750TFX, serial # [id number]     Peak transaortic velocity 2.387m, mean gradient 1520m. 11. The pulmonic  valve was normal in structure. Pulmonic valve regurgitation is not visualized by color flow Doppler. 12. A pacer wire is visualized. 13. The inferior vena cava is normal in size with greater than 50% respiratory variability, suggesting right atrial pressure of 3 mmHg.   ASSESSMENT & PLAN:   Severe AS s/p TAVR: echo today shows EF 55-60%, normally functioning TAVR with mean gradient of 11.6 mm hg and no PVL. He has NYHA class II symptoms of chronic shortness of breath. SBE prophylaxis discussed; I have Rx'd amoxicillin. Continue on ASA and Plavix. Plavix can be discontinued after 6 months of therapy (10/2019).   Chronic diastolic CHF: appears euvolemic. Followed closely by the advanced CHF clinic.   Incidental findings: pre TAVR CT scans showed multiple indeterminate lesions in the left kidney, favored to represent proteinaceous/hemorrhagic cysts. These could be definitively characterized with nonemergent MRI of the abdomen with and without IV gadolinium if of clinical concern. There were two tiny 3 mm right upper lobe pulmonary nodules, nonspecific, but statistically likely benign. No follow-up needed if patient is low-risk (and has no known or suspected primary neoplasm). Non-contrast chest CT can be considered in 12 months if patient is high risk. He remotely smoked, but very little. I would think he would be low risk. I will defer to PCP on whether either of these require follow up.   Medication Adjustments/Labs and Tests Ordered: Current medicines are reviewed at length with the patient today.  Concerns regarding medicines are outlined above.  Medication changes, Labs and Tests ordered today are listed in the Patient Instructions below. Patient Instructions  Medication Instructions:  1) Amoxicillin- take 4 capsules by mouth 1 hour prior to any dental work. 2) DISCONTINUE Plavix on November 05, 2019  *If you need a refill on your cardiac medications before your next appointment, please call your  pharmacy*  Lab Work: None If you have labs (blood work) drawn today and your tests are completely normal, you will receive your results only by:  [facility] you have MyChart) OR  A paper copy in the mail If you have any lab  test that is abnormal or we need to change your treatment, we will call you to review the results.  Testing/Procedures: None  Follow-Up:  Please see Dr. Aundra Fletcher in January as previously planned.  You will be contacted when it is closer to time to schedule your one year follow up and echocardiogram with the TAVR team.   Other Instructions      Signed, Angelena Form, PA-C  06/07/2019 4:41 PM    [address], [place], [place]  [address] Phone: [phone]; Fax: [phone]

## 2019-06-07 NOTE — Patient Instructions (Signed)
Medication Instructions:  1) Amoxicillin- take 4 capsules by mouth 1 hour prior to any dental work. 2) DISCONTINUE Plavix on November 05, 2019  *If you need a refill on your cardiac medications before your next appointment, please call your pharmacy*  Lab Work: None If you have labs (blood work) drawn today and your tests are completely normal, you will receive your results only by: Marland Kitchen MyChart Message (if you have MyChart) OR . A paper copy in the mail If you have any lab test that is abnormal or we need to change your treatment, we will call you to review the results.  Testing/Procedures: None  Follow-Up:  Please see Dr. Aundra Dubin in January as previously planned.  You will be contacted when it is closer to time to schedule your one year follow up and echocardiogram with the TAVR team.   Other Instructions

## 2019-06-14 ENCOUNTER — Encounter: Payer: Medicare (Managed Care) | Admitting: *Deleted

## 2019-08-20 ENCOUNTER — Ambulatory Visit (HOSPITAL_COMMUNITY)
Admission: RE | Admit: 2019-08-20 | Discharge: 2019-08-20 | Disposition: A | Discharge status: Home / Self Care | Payer: Medicare (Managed Care) | Source: Ambulatory Visit | Attending: Cardiology | Admitting: Cardiology

## 2019-08-20 ENCOUNTER — Other Ambulatory Visit: Payer: Self-pay

## 2019-08-20 ENCOUNTER — Encounter (HOSPITAL_COMMUNITY): Payer: Self-pay | Admitting: Cardiology

## 2019-08-20 VITALS — BP 154/73 | HR 70 | Wt 316.2 lb

## 2019-08-20 DIAGNOSIS — Z6841 Body Mass Index (BMI) 40.0 and over, adult: Secondary | ICD-10-CM | POA: Insufficient documentation

## 2019-08-20 DIAGNOSIS — Z87891 Personal history of nicotine dependence: Secondary | ICD-10-CM | POA: Diagnosis not present

## 2019-08-20 DIAGNOSIS — Z7902 Long term (current) use of antithrombotics/antiplatelets: Secondary | ICD-10-CM | POA: Diagnosis not present

## 2019-08-20 DIAGNOSIS — E1122 Type 2 diabetes mellitus with diabetic chronic kidney disease: Secondary | ICD-10-CM | POA: Insufficient documentation

## 2019-08-20 DIAGNOSIS — I480 Paroxysmal atrial fibrillation: Secondary | ICD-10-CM | POA: Diagnosis not present

## 2019-08-20 DIAGNOSIS — N183 Chronic kidney disease, stage 3 unspecified: Secondary | ICD-10-CM

## 2019-08-20 DIAGNOSIS — H3530 Unspecified macular degeneration: Secondary | ICD-10-CM | POA: Insufficient documentation

## 2019-08-20 DIAGNOSIS — I2510 Atherosclerotic heart disease of native coronary artery without angina pectoris: Secondary | ICD-10-CM | POA: Diagnosis not present

## 2019-08-20 DIAGNOSIS — I5032 Chronic diastolic (congestive) heart failure: Secondary | ICD-10-CM | POA: Insufficient documentation

## 2019-08-20 DIAGNOSIS — I350 Nonrheumatic aortic (valve) stenosis: Secondary | ICD-10-CM | POA: Insufficient documentation

## 2019-08-20 DIAGNOSIS — I130 Hypertensive heart and chronic kidney disease with heart failure and stage 1 through stage 4 chronic kidney disease, or unspecified chronic kidney disease: Secondary | ICD-10-CM | POA: Insufficient documentation

## 2019-08-20 DIAGNOSIS — E1142 Type 2 diabetes mellitus with diabetic polyneuropathy: Secondary | ICD-10-CM | POA: Insufficient documentation

## 2019-08-20 DIAGNOSIS — Z7982 Long term (current) use of aspirin: Secondary | ICD-10-CM | POA: Diagnosis not present

## 2019-08-20 DIAGNOSIS — Z79899 Other long term (current) drug therapy: Secondary | ICD-10-CM | POA: Insufficient documentation

## 2019-08-20 DIAGNOSIS — E662 Morbid (severe) obesity with alveolar hypoventilation: Secondary | ICD-10-CM | POA: Insufficient documentation

## 2019-08-20 DIAGNOSIS — Z9981 Dependence on supplemental oxygen: Secondary | ICD-10-CM | POA: Diagnosis not present

## 2019-08-20 DIAGNOSIS — Z794 Long term (current) use of insulin: Secondary | ICD-10-CM | POA: Insufficient documentation

## 2019-08-20 DIAGNOSIS — Z952 Presence of prosthetic heart valve: Secondary | ICD-10-CM | POA: Diagnosis not present

## 2019-08-20 DIAGNOSIS — Z950 Presence of cardiac pacemaker: Secondary | ICD-10-CM | POA: Diagnosis not present

## 2019-08-20 DIAGNOSIS — H548 Legal blindness, as defined in USA: Secondary | ICD-10-CM | POA: Insufficient documentation

## 2019-08-20 DIAGNOSIS — R001 Bradycardia, unspecified: Secondary | ICD-10-CM | POA: Diagnosis not present

## 2019-08-20 DIAGNOSIS — Z8249 Family history of ischemic heart disease and other diseases of the circulatory system: Secondary | ICD-10-CM | POA: Insufficient documentation

## 2019-08-20 LAB — CBC
HCT: 35.6 % — ABNORMAL LOW (ref 39.0–52.0)
Hemoglobin: 10.6 g/dL — ABNORMAL LOW (ref 13.0–17.0)
MCH: 24 pg — ABNORMAL LOW (ref 26.0–34.0)
MCHC: 29.8 g/dL — ABNORMAL LOW (ref 30.0–36.0)
MCV: 80.5 fL (ref 80.0–100.0)
Platelets: 212 10*3/uL (ref 150–400)
RBC: 4.42 MIL/uL (ref 4.22–5.81)
RDW: 16.1 % — ABNORMAL HIGH (ref 11.5–15.5)
WBC: 10.3 10*3/uL (ref 4.0–10.5)
nRBC: 0 % (ref 0.0–0.2)

## 2019-08-20 LAB — BASIC METABOLIC PANEL
Anion gap: 12 (ref 5–15)
BUN: 72 mg/dL — ABNORMAL HIGH (ref 8–23)
CO2: 25 mmol/L (ref 22–32)
Calcium: 9.1 mg/dL (ref 8.9–10.3)
Chloride: 99 mmol/L (ref 98–111)
Creatinine, Ser: 2.51 mg/dL — ABNORMAL HIGH (ref 0.61–1.24)
GFR calc Af Amer: 29 mL/min — ABNORMAL LOW (ref >60)
GFR calc non Af Amer: 25 mL/min — ABNORMAL LOW (ref >60)
Glucose, Bld: 170 mg/dL — ABNORMAL HIGH (ref 70–99)
Potassium: 4 mmol/L (ref 3.5–5.1)
Sodium: 136 mmol/L (ref 135–145)

## 2019-08-20 LAB — LIPID PANEL
Cholesterol: 111 mg/dL (ref 0–200)
HDL: 32 mg/dL — ABNORMAL LOW (ref >40)
LDL Cholesterol: 57 mg/dL (ref 0–99)
Total CHOL/HDL Ratio: 3.5 RATIO
Triglycerides: 109 mg/dL (ref <150)
VLDL: 22 mg/dL (ref 0–40)

## 2019-08-20 NOTE — Patient Instructions (Signed)
No medication changes today!   Labs today We will only contact you if something comes back abnormal or we need to make some changes. Otherwise no news is good news!   You have been ordered a PYP Scan.  This is done in the Radiology Department of [facility].  When you come for this test please plan to be there 2-3 hours   Your physician recommends that you schedule a follow-up appointment in: 6 weeks with the Nurse Practitioner  Please call office at [phone] option 2 if you have any questions or concerns.   At the [facility], you and your health needs are our priority. As part of our continuing mission to provide you with exceptional heart care, we have created designated Provider Care Teams. These Care Teams include your primary Cardiologist (physician) and Advanced Practice Providers (APPs- Physician Assistants and Nurse Practitioners) who all work together to provide you with the care you need, when you need it.   You may see any of the following providers on your designated Care Team at your next follow up: Marland Kitchen Dr Glori Bickers . Dr Loralie Champagne . Darrick Grinder, NP . Lyda Jester, PA . Audry Riles, PharmD   Please be sure to bring in all your medications bottles to every appointment.

## 2019-08-20 NOTE — Progress Notes (Signed)
Patient ID: Segundo Makela, male   DOB: 1946-02-24, 74 y.o.   MRN: [id number]   [facility]  PCP: [name] Cardiology: Dr. Sallyanne Kuster HF Cardiology: Dr. Aundra Dubin Nephrologist: Dr. Merlene Morse Stamey is a 74 y.o. 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   He was admitted in 11/18 with acute respiratory failure => intubated.  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  He was treated for acute on chronic diastolic CHF with IV diuresis.    Patient was admitted in 1/20 with acute on chronic diastolic CHF.  He was found to have severe aortic stenosis.  He was diuresed and developed AKI.    In 8/20, he had LHC showing nonobstructive CAD.  He then had TAVR in 9/20.  Last echo in 10/20 showed EF 55-60% with stable TAVR valve (mean gradient 15 mmHg).     He presents today for followup of CHF.  Using home oxygen and CPAP at night.  [facility]  He does not know his medications and did not bring a list.  We were eventually able to contact [facility] and reconcile his medication list.  Breathing is about the same, no significant change post-TAVR.   He is short of breath walking 30-40 feet.  No chest pain.  No lightheadedness.  No palpitations, he appears to be in NSR today.   Labs (11/18): hgb 14.8, K 4.6, creatinine 2.34 Labs (08/17/2017): K 3.5 Creatinine 2.16  Labs (11/29/2017): K 3.4 Creatinine 2.52  Labs (6/19): K 3.6, creatinine 2.29 Labs (9/19): K 4.3, creatinine 1.93 Labs (1/20): K 4.4, creatinine 2.2, BUN 72 Labs (9/20): K 3.8, creatinine 2.44  ECG (personally reviewed): NSR with long 1st degree AVB alternating with NSR and RV pacing.   PMH: 1. Chronic diastolic CHF with RV dysfunction: Echo (2/15) with EF 60%, mild LVH, mild aortic stenosis with mean gradient 18 mmHg, RV mildly dilated with mildly  decreased systolic function.  - Echo (10/18): EF 60-65%, moderate aortic stenosis.  - Echo (8/19): EF 60-65%, mild LVH, moderate AS with mean gradient 29 mmHg/AVA 1.1 cm^2, PASP 45, normal RV size with mildly decreased systolic function.  - TEE (1/20): EF 55-60%, moderate LVH, AVA 0.5 cm^2 by planimetry with mean gradient 35 mmHg, AVA 1.0 cm^2 continuity equation.  - RHC (1/20): mean RA 15, PA 68/23 mean 44, mean PCWP 20, CI 3.8, PVR 3 WU 2. Aortic stenosis: Suspect severe at this point.  - TEE (1/20): EF 55-60%, moderate LVH, AVA 0.5 cm^2 by planimetry with mean gradient 35 mmHg, AVA 1.0 cm^2 continuity equation.   - TAVR (9/20) with [place] 3 valve.   - Echo (10/20): EF 55-60%, mild RV dilation with normal systolic function, bioprosthetic aortic valve s/p TAVR with mean gradient 15 mmHg.  3. Type II diabetes 4. 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  - 3.8 cm ascending aorta on 8/19 echo.  5. OHS/OSA: Uses CPAP and oxygen during the day.  6. Morbid obesity.  7. HTN 8. CAD: Nonobstructive.  Sunol 2005 with 60% LAD stenosis. 9. 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  10. Atrial fibrillation: Paroxysmal.  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  11. CKD stage III 12. Macular degeneration: Legally blind.  13. 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  14. 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 15. S/p left foot amputation 16. Diabetic peripheral neuropathy.  17. H/o atrial tachycardia 18. CAD: LHC (8/20) with 50% mid RCA stenosis.   Social History   Socioeconomic History  . Marital status: Divorced    Spouse name: Not on file  . Number of children: 4  . Years of education: 59  . Highest education level: 12th grade  Occupational History  . Occupation: disabled  Tobacco Use  . Smoking status: Former Smoker    Packs/day: 0.00    Years: 0.00    Pack years: 0.00    Types: Cigarettes    Quit date: 08/17/2003     Years since quitting: 16.0  . Smokeless tobacco: Never Used  . Tobacco comment: started at age 67.  only smoked on occ- very rarely.0  Substance and Sexual Activity  . Alcohol use: Yes    Comment: 06/23/2013 "drank some; never had a problem w/it"  . Drug use: No  . Sexual activity: Never  Other Topics Concern  . Not on file  Social History Narrative   12/10- reports issues with housing- states it is not disability friendly but has ramp, first floor apt, and has grab bars/assistive devices provided by [facility].  Reports that he has concerns with having enough food but gets meals on wheels, eats at [facility] 3x a week, and has personal aid who cooks and shops for him.  Reports concerns with transportation but gets [facility] transport for all of his medical appts and his family or an aid helps him with other transportation needs.   Social Determinants of Health   Financial Resource Strain:   . Difficulty of Paying Living Expenses: Not on file  Food Insecurity:   . Worried About Charity fundraiser in the Last Year: Not on file  . Ran Out of Food in the Last Year: Not on file  Transportation Needs:   . Lack of Transportation (Medical): Not on file  . Lack of Transportation (Non-Medical): Not on file  Physical Activity:   . Days of Exercise per Week: Not on file  . Minutes of Exercise per Session: Not on file  Stress:   . Feeling of Stress : Not on file  Social Connections:   . Frequency of Communication with Friends and Family: Not on file  . Frequency of Social Gatherings with Friends and Family: Not on file  . Attends Religious Services: Not on file  . Active Member of Clubs or Organizations: Not on file  . Attends Archivist Meetings: Not on file  . Marital Status: Not on file  Intimate Partner Violence:   . Fear of Current or Ex-Partner: Not on file  . Emotionally Abused: Not on file  . Physically Abused: Not on file  . Sexually Abused: Not on file    Family History  Problem  Relation Age of Onset  . Coronary artery disease Sister   . Heart disease Sister   . Allergies Sister   . Asthma Brother   . Rheum arthritis Brother    Review of systems complete and found to be negative unless listed in HPI.    Current Outpatient Medications  Medication Sig Dispense Refill  . acetaminophen (TYLENOL) 650 MG CR tablet Take 1,300 mg by mouth 2 (two) times daily.     Marland Kitchen amLODipine (NORVASC) 10 MG tablet Take 10 mg by mouth daily. Reported on 12/09/2015    . amoxicillin (AMOXIL) 500 MG capsule Take 4 capsules by mouth one  hour prior to dental work 4 capsule 5  . antiseptic oral rinse (BIOTENE) LIQD 15 mLs by Mouth Rinse route 3 (three) times daily as needed for dry mouth.     Marland Kitchen aspirin EC 81 MG tablet Take 1 tablet (81 mg total) by mouth daily. 90 tablet 3  . cholecalciferol (VITAMIN D) 1000 units tablet Take 1,000 Units by mouth daily.    . citalopram (CELEXA) 20 MG tablet Take 20 mg by mouth daily.    . clopidogrel (PLAVIX) 75 MG tablet Take 1 tablet (75 mg total) by mouth daily.    . famotidine (PEPCID) 20 MG tablet Take 20 mg by mouth daily.    . Febuxostat (ULORIC) 80 MG TABS Take 80 mg by mouth daily.     Marland Kitchen gabapentin (NEURONTIN) 300 MG capsule Take 300 mg by mouth 2 (two) times daily.    . hydrALAZINE (APRESOLINE) 50 MG tablet Take 75 mg by mouth 3 (three) times daily.    . insulin glargine (LANTUS) 100 UNIT/ML injection Inject 90 Units into the skin daily.    . Liraglutide (VICTOZA) 18 MG/3ML SOPN Inject 1.8 mg into the skin daily.    . Menthol, Topical Analgesic, (BIOFREEZE EX) Apply 1 application topically 2 (two) times daily as needed (knee/shoulder pain).     Marland Kitchen metolazone (ZAROXOLYN) 2.5 MG tablet Take 1.25 mg by mouth 3 (three) times a week. [id number]    . metoprolol succinate (TOPROL-XL) 50 MG 24 hr tablet Take 1 tablet (50 mg total) by mouth daily. Take with or immediately following a meal. 30 tablet 3  . polyethylene glycol (MIRALAX / GLYCOLAX) packet Take 17 g by  mouth daily as needed for moderate constipation.    . potassium chloride SA (KLOR-CON) 20 MEQ tablet Take 80 mEq by mouth 2 (two) times daily.    . rosuvastatin (CRESTOR) 20 MG tablet Take 20 mg by mouth daily.    . sennosides-docusate sodium (SENOKOT-S) 8.6-50 MG tablet Take 2 tablets by mouth 2 (two) times daily.     . Skin Protectants, Misc. (EUCERIN) cream Apply 1 application topically 2 (two) times daily.    Marland Kitchen torsemide (DEMADEX) 20 MG tablet Take 120 mg by mouth daily.     No current facility-administered medications for this encounter.   Facility-Administered Medications Ordered in Other Encounters  Medication Dose Route Frequency Provider Last Rate Last Admin  . chlorhexidine gluconate (MEDLINE KIT) (PERIDEX) 0.12 % solution 15 mL  15 mL Mouth Rinse BID Chesley Mires, MD      . MEDLINE mouth rinse  15 mL Mouth Rinse QID Chesley Mires, MD       Vitals:   08/20/19 1108  BP: (!) 154/73  Pulse: 70  SpO2: 98%  Weight: (!) 143.4 kg (316 lb 3.2 oz)    Wt Readings from Last 3 Encounters:  08/20/19 (!) 143.4 kg (316 lb 3.2 oz)  06/07/19 (!) 141.5 kg (312 lb)  05/23/19 (!) 142.4 kg (314 lb)    Physical Exam General: NAD Neck: Thick, no JVD, no thyromegaly or thyroid nodule.  Lungs: Clear to auscultation bilaterally with normal respiratory effort. CV: Nondisplaced PMI.  Heart regular S1/S2, no S3/S4, 1/6 SEM RUSB.  1+ edema to knees.  No carotid bruit.  Normal pedal pulses.  Abdomen: Soft, nontender, no hepatosplenomegaly, no distention.  Skin: Intact without lesions or rashes.  Neurologic: Alert and oriented x 3.  Psych: Normal affect. Extremities: No clubbing or cyanosis.  HEENT: Normal.   Assessment/Plan:  1. Chronic  diastolic CHF with prominent RV failure: Volume has been difficult to manage in setting of RV failure (OHS/OSA) CKD stage 3.  NYHA class Illb symptoms chronically.  He is not significantly volume overloaded on exam today but exam is difficult.  - Continue  torsemide 120 mg BID - He is taking metolazone 1.25 mg three days a week, continue.  - BMET today.   - TTR amyloidosis would be a consideration with his constellation of CHF, aortic stenosis, renal failure.  I will send myeloma panel and arrange for PYP scan.   2. Aortic stenosis:  S/p TAVR 9/20.  Valve stable on 10/20 echo with no significant AI and mean gradient 15 mmHg.  - Antibiotic prophylaxis with dental work.  - Can stop Plavix after 6 months (3/21).  3. Atrial fibrillation:  He appears to be in sinus today with very low voltage Ps (regular rhythm).  He is not anticoagulated (takes ASA) due to prior intracerebral hemorrhage. 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   - As he is not anticoagulated, would continue rate control.  Cannot do DCCV without anticoagulation.  - Continue Toprol XL 50 mg daily.  4. CAD: Nonobstructive.  No chest pain.  - Continue ASA and statin, check lipids today.   5. 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 CKD: Stage III.   - BMET today.   7. OHS/OSA: Continue nightly CPAP and home oxygen.   Followup in 6 wks with NP/PA.   Loralie Champagne, MD  08/20/2019   Addendum:  Creatinine is improving, down to 1.8.  Will send him back to Dr. Burt Knack for evaluation as we may have a window to treat his AS.    Loralie Champagne 08/20/2019

## 2019-08-22 LAB — MULTIPLE MYELOMA PANEL, SERUM
Albumin SerPl Elph-Mcnc: 3.2 g/dL (ref 2.9–4.4)
Albumin/Glob SerPl: 0.8 (ref 0.7–1.7)
Alpha 1: 0.3 g/dL (ref 0.0–0.4)
Alpha2 Glob SerPl Elph-Mcnc: 0.8 g/dL (ref 0.4–1.0)
B-Globulin SerPl Elph-Mcnc: 1.3 g/dL (ref 0.7–1.3)
Gamma Glob SerPl Elph-Mcnc: 1.7 g/dL (ref 0.4–1.8)
Globulin, Total: 4.2 g/dL — ABNORMAL HIGH (ref 2.2–3.9)
IgA: 351 mg/dL (ref 61–437)
IgG (Immunoglobin G), Serum: 2176 mg/dL — ABNORMAL HIGH (ref 603–1613)
IgM (Immunoglobulin M), Srm: 147 mg/dL — ABNORMAL HIGH (ref 15–143)
M Protein SerPl Elph-Mcnc: 1 g/dL — ABNORMAL HIGH
Total Protein ELP: 7.4 g/dL (ref 6.0–8.5)

## 2019-08-23 ENCOUNTER — Telehealth (HOSPITAL_COMMUNITY): Payer: Self-pay

## 2019-08-23 DIAGNOSIS — D472 Monoclonal gammopathy: Secondary | ICD-10-CM

## 2019-08-23 NOTE — Telephone Encounter (Signed)
-----   Message from Larey Dresser, MD sent at 08/22/2019  7:23 PM EST ----- IgG monoclonal antibody present.  Will need hematology referral.

## 2019-08-24 ENCOUNTER — Telehealth (HOSPITAL_COMMUNITY): Payer: Self-pay

## 2019-08-24 NOTE — Telephone Encounter (Signed)
Pt aware he will be referred to hematology clinic to assess abnormal lab results. Verbalized understanding. Referral placed by Philicia CMA

## 2019-08-24 NOTE — Telephone Encounter (Signed)
-----   Message from Larey Dresser, MD sent at 08/22/2019  7:23 PM EST ----- IgG monoclonal antibody present.  Will need hematology referral.

## 2019-08-27 ENCOUNTER — Encounter: Payer: Medicare (Managed Care) | Admitting: Cardiovascular Disease

## 2019-08-31 ENCOUNTER — Encounter (HOSPITAL_COMMUNITY)
Admission: RE | Admit: 2019-08-31 | Discharge: 2019-08-31 | Disposition: A | Discharge status: Home / Self Care | Payer: Medicare (Managed Care) | Source: Ambulatory Visit | Attending: Cardiology | Admitting: Cardiology

## 2019-08-31 ENCOUNTER — Other Ambulatory Visit: Payer: Self-pay

## 2019-08-31 DIAGNOSIS — I5032 Chronic diastolic (congestive) heart failure: Secondary | ICD-10-CM

## 2019-08-31 MED ORDER — TECHNETIUM TC 99M PYROPHOSPHATE
21.0000 | Freq: Once | INTRAVENOUS | Status: AC | PRN
  Administered 2019-08-31: 21 via INTRAVENOUS
  Filled 2019-08-31: qty 21

## 2019-09-03 ENCOUNTER — Telehealth: Payer: Self-pay | Admitting: Hematology

## 2019-09-03 NOTE — Telephone Encounter (Signed)
Pt has been cld and scheduled to see Dr. Irene Limbo on 2/4 at [facility]. Pt states that he's at [facility]. I cld and lft the transportation coordinator a vm. The appt date and time. I provided the appt details on the vm

## 2019-09-19 IMAGING — CT CT HEAD W/O CM
3 series · 15 of 47 positions shown, 18 images · non-contrast
Comparison: 10/24/2013

ADDENDUM:
Enlarged pituitary gland, measuring up to 1 cm. Correlate with any
history of pituitary hyper secretion and consider correlation with
endocrine function test.
CLINICAL DATA: Ataxia with dizziness and nausea

EXAM:
CT HEAD WITHOUT CONTRAST
TECHNIQUE: Contiguous axial images were obtained from the base of the skull
through the vertex without intravenous contrast.

[Series 3: head 5.0 h30s · axial · 0.49mm/px · z∈[-113,+32]mm · 9 of 35 slices shown, 12 images]
[im 3/35  brain]
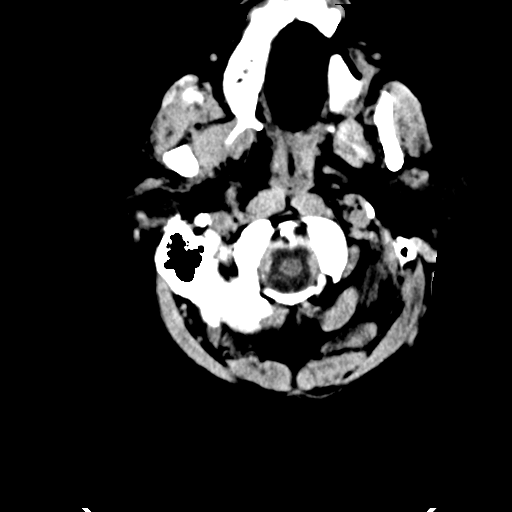
[im 3/35  bone]
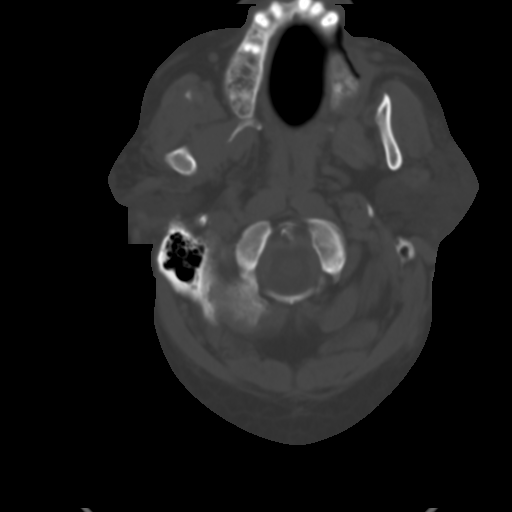
[im 6/35  brain]
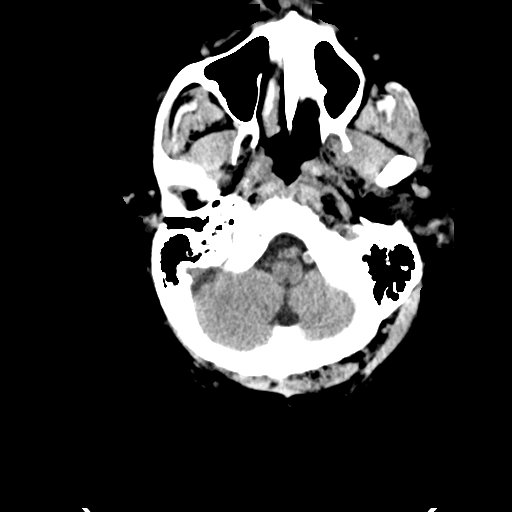
[im 10/35  brain]
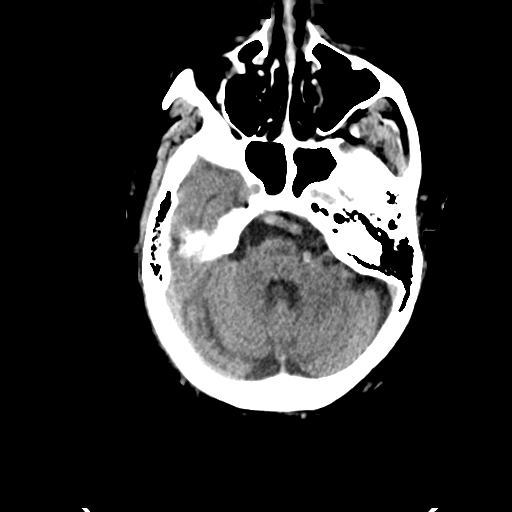
[im 13/35  brain]
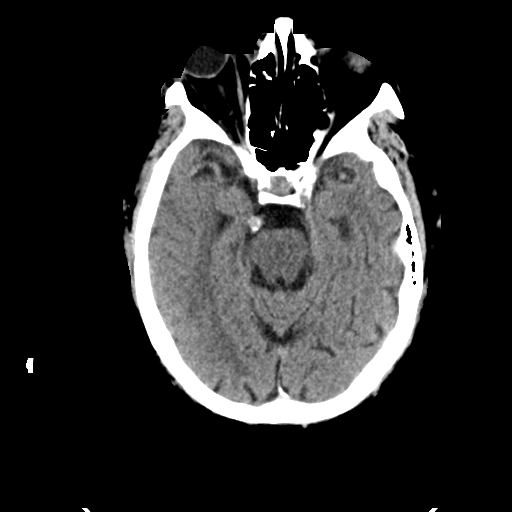
[im 18/35  brain]
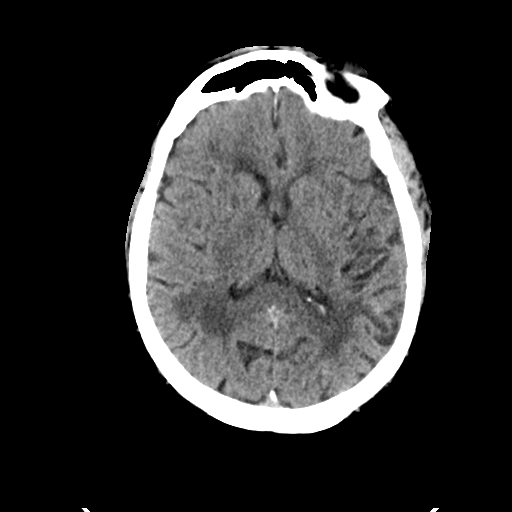
[im 18/35  bone]
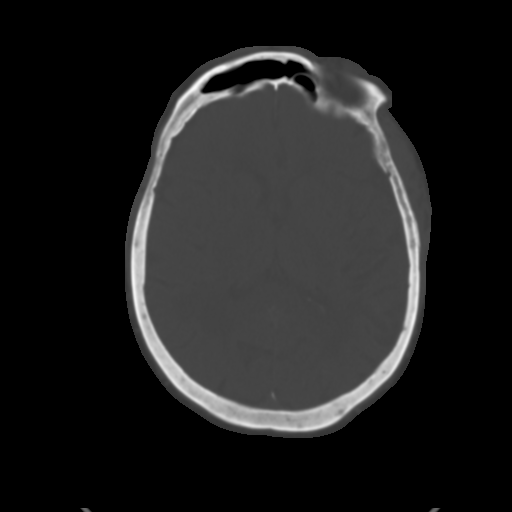
[im 22/35  brain]
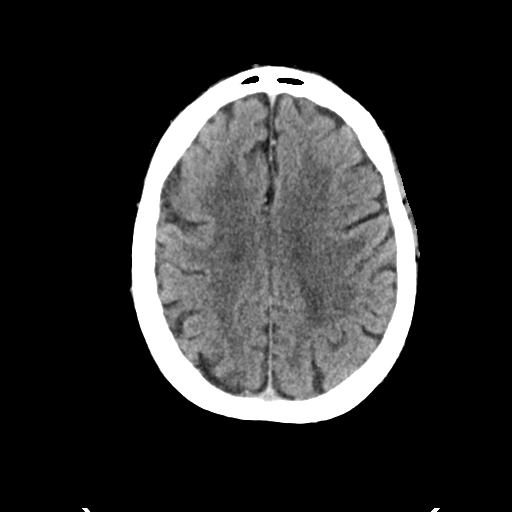
[im 25/35  brain]
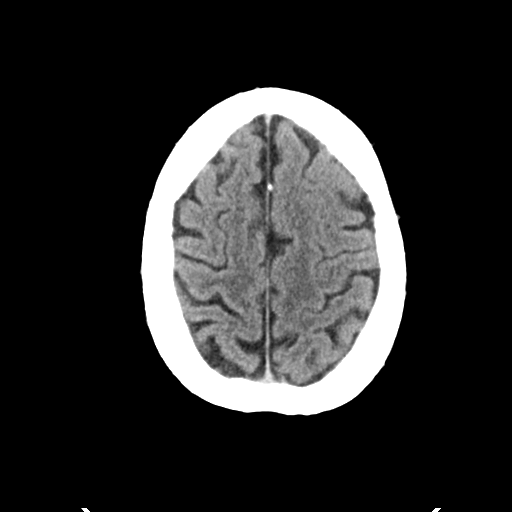
[im 29/35  brain]
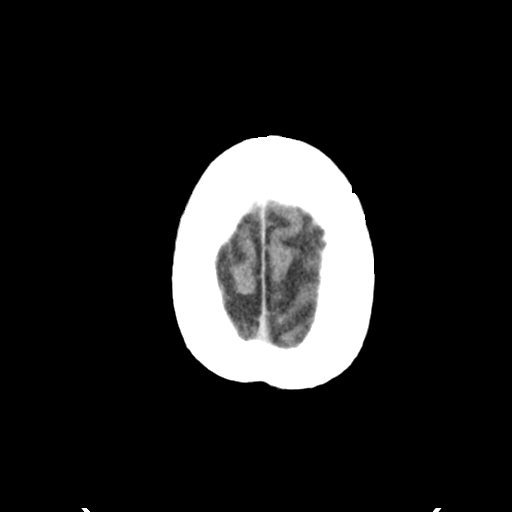
[im 32/35  brain]
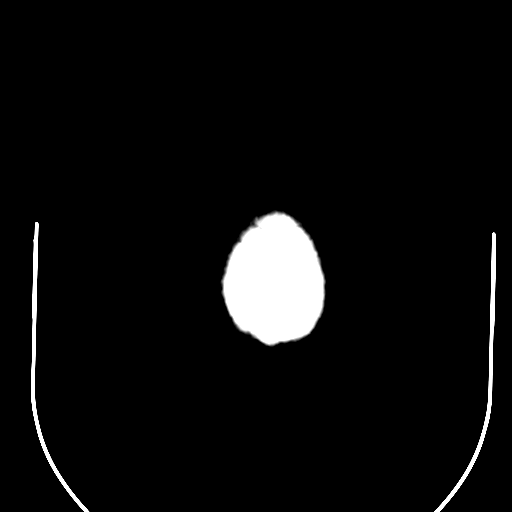
[im 32/35  bone]
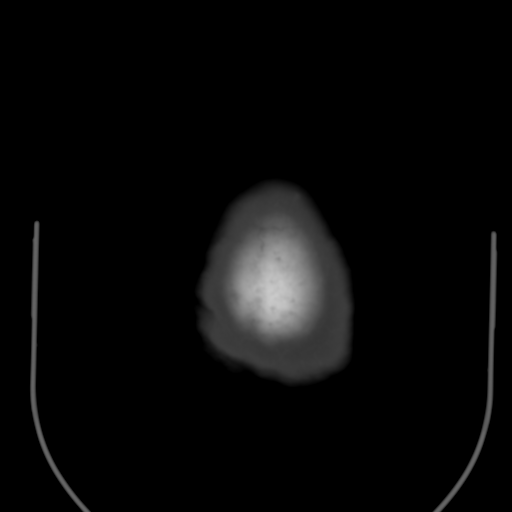

[Series 5: head 3.0 mpr cor · coronal · 0.33mm/px · 3 of 73 slices shown]
[im 25/73  brain]
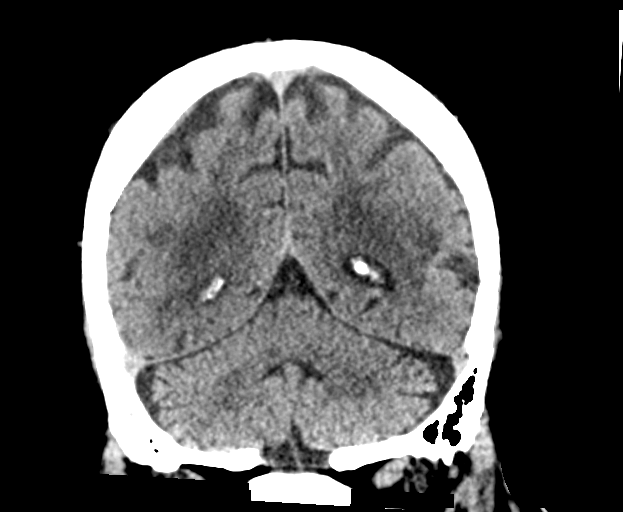
[im 33/73  brain]
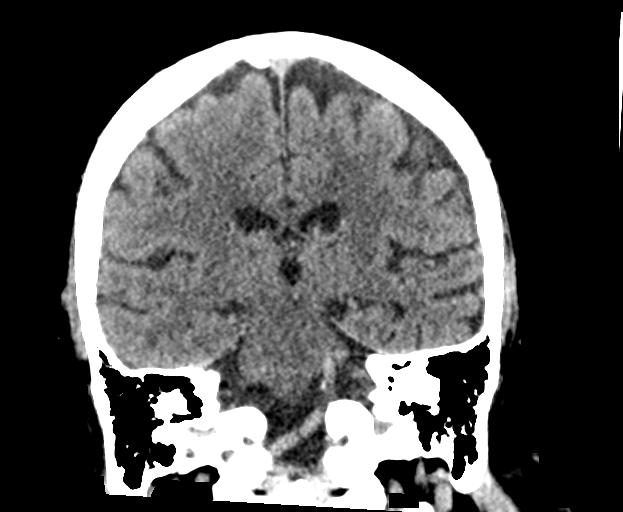
[im 41/73  brain]
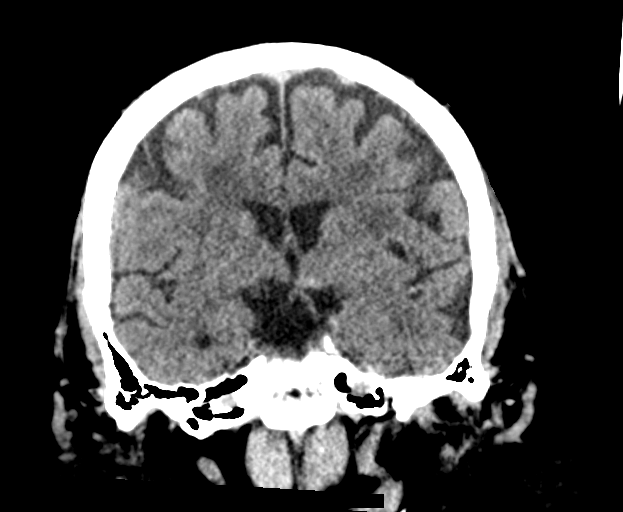

[Series 6: head 3.0 mpr sag · sagittal · 0.34mm/px · 3 of 62 slices shown]
[im 22/62  brain]
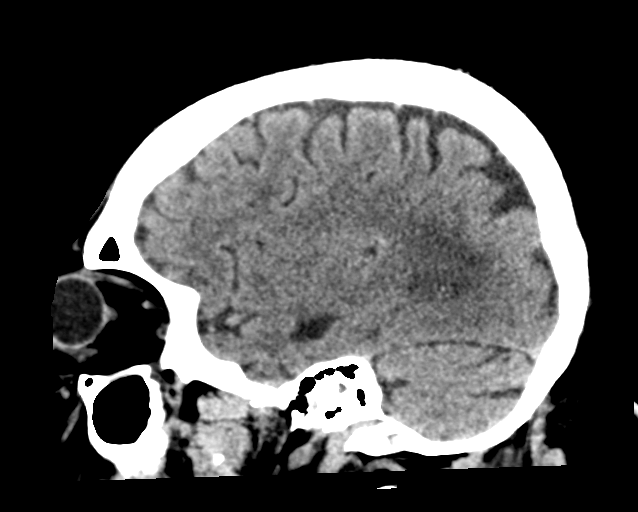
[im 31/62  brain]
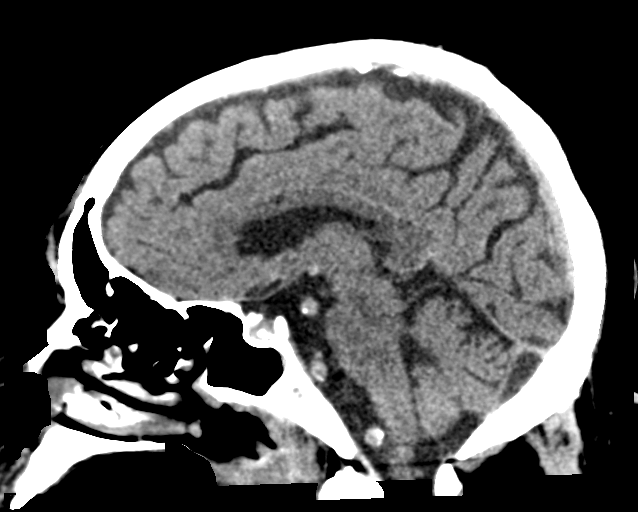
[im 40/62  brain]
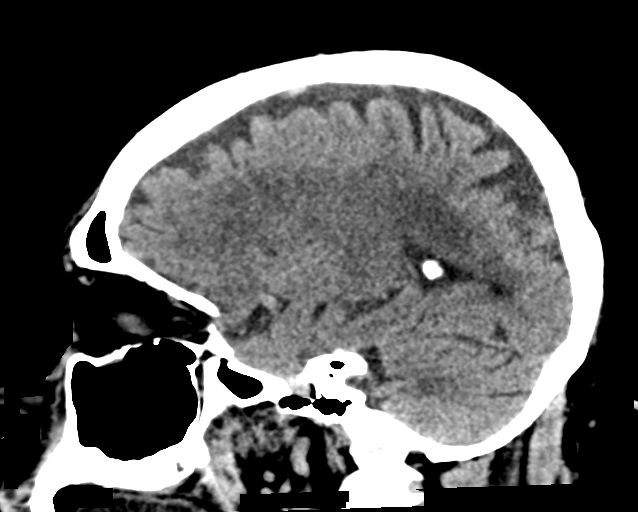

[15 of 47 positions shown; findings below may reference images not displayed]

FINDINGS: Brain: No acute territorial infarction, hemorrhage or intracranial
is visualized. Mild encephalomalacia in the posterior left temporal
lobe at the site of prior hemorrhage with minimal calcification in
the region. Old appearing lacunar infarcts within the bilateral
basal ganglia. Fairly extensive white matter hypodensity presumably
due to small vessel ischemic changes. Mild atrophy. Stable ventricle
size.

Vascular: No hyperdense vessels. An carotid artery vertebral artery
calcification.

Skull: No fracture.

Sinuses/Orbits: Mucosal thickening in the maxillary and ethmoid
sinuses. Bilateral proptosis as before.

Other: None
IMPRESSION: 1. No definite CT evidence for acute intracranial abnormality.
2. Mild encephalomalacia in the posterior left temporal lobe with
punctate calcification, correlating to the site of prior hemorrhage.
3. Fairly extensive hypodensity within the bilateral white matter,
consistent with small vessel ischemic changes.

## 2019-09-20 ENCOUNTER — Inpatient Hospital Stay: Payer: Medicare (Managed Care) | Attending: Hematology | Admitting: Hematology

## 2019-09-20 ENCOUNTER — Inpatient Hospital Stay: Payer: Medicare (Managed Care)

## 2019-09-21 ENCOUNTER — Other Ambulatory Visit: Payer: Self-pay | Admitting: Physician Assistant

## 2019-09-21 DIAGNOSIS — I2510 Atherosclerotic heart disease of native coronary artery without angina pectoris: Secondary | ICD-10-CM

## 2019-09-21 DIAGNOSIS — N1832 Chronic kidney disease, stage 3b: Secondary | ICD-10-CM

## 2019-09-21 DIAGNOSIS — I5033 Acute on chronic diastolic (congestive) heart failure: Secondary | ICD-10-CM

## 2019-09-21 DIAGNOSIS — I10 Essential (primary) hypertension: Secondary | ICD-10-CM

## 2019-09-21 DIAGNOSIS — I5032 Chronic diastolic (congestive) heart failure: Secondary | ICD-10-CM

## 2019-09-21 DIAGNOSIS — U071 COVID-19: Secondary | ICD-10-CM

## 2019-09-21 NOTE — Progress Notes (Signed)
  I connected by phone with [name] on 09/21/2019 at 12:45 PM to discuss the potential use of an new treatment for mild to moderate COVID-19 viral infection in non-hospitalized patients.  This patient is a 74 y.o. male that meets the FDA criteria for Emergency Use Authorization of bamlanivimab or casirivimab\imdevimab.  Has a (+) direct SARS-CoV-2 viral test result  Has mild or moderate COVID-19   Is ? 72 years of age and weighs ? 40 kg  Is NOT hospitalized due to COVID-19  Is NOT requiring oxygen therapy or requiring an increase in baseline oxygen flow rate due to COVID-19  Is within 10 days of symptom onset  Has at least one of the high risk factor(s) for progression to severe COVID-19 and/or hospitalization as defined in EUA.  Specific high risk criteria : >/= 72 yo, BMI >35, CKD stage III, HTN, morbid obesity   I have spoken and communicated the following to the patient or parent/caregiver:  1. FDA has authorized the emergency use of bamlanivimab and casirivimab\imdevimab for the treatment of mild to moderate COVID-19 in adults and pediatric patients with positive results of direct SARS-CoV-2 viral testing who are 47 years of age and older weighing at least 40 kg, and who are at high risk for progressing to severe COVID-19 and/or hospitalization.  2. The significant known and potential risks and benefits of bamlanivimab and casirivimab\imdevimab, and the extent to which such potential risks and benefits are unknown.  3. Information on available alternative treatments and the risks and benefits of those alternatives, including clinical trials.  4. Patients treated with bamlanivimab and casirivimab\imdevimab should continue to self-isolate and use infection control measures (e.g., wear mask, isolate, social distance, avoid sharing personal items, clean and disinfect "high touch" surfaces, and frequent handwashing) according to CDC guidelines.   5. The patient or  parent/caregiver has the option to accept or refuse bamlanivimab or casirivimab\imdevimab .  After reviewing this information with the patient, The patient agreed to proceed with receiving the casirivimab\imdevimab infusion and will be provided a copy of the Fact sheet prior to receiving the infusion.   Pt is set up for 09/22/19 @ 10:30am. He will require transportation from either his apartment or [facility] if they can get him there today. We will keep the team updated on where the pick up will be. He is blind and will need assistance getting into the car and into the infusion center. His sx onset was 09/18/19. He was tested through [name] [facility].   Angelena Form PA-C 09/21/2019 12:45 PM

## 2019-09-22 ENCOUNTER — Ambulatory Visit (HOSPITAL_COMMUNITY)
Admission: RE | Admit: 2019-09-22 | Discharge: 2019-09-22 | Disposition: A | Discharge status: Home / Self Care | Payer: Medicare Other | Source: Ambulatory Visit | Attending: Pulmonary Disease | Admitting: Pulmonary Disease

## 2019-09-22 DIAGNOSIS — I2510 Atherosclerotic heart disease of native coronary artery without angina pectoris: Secondary | ICD-10-CM | POA: Insufficient documentation

## 2019-09-22 DIAGNOSIS — I5032 Chronic diastolic (congestive) heart failure: Secondary | ICD-10-CM | POA: Diagnosis present

## 2019-09-22 DIAGNOSIS — U071 COVID-19: Secondary | ICD-10-CM | POA: Insufficient documentation

## 2019-09-22 DIAGNOSIS — I5033 Acute on chronic diastolic (congestive) heart failure: Secondary | ICD-10-CM

## 2019-09-22 DIAGNOSIS — I10 Essential (primary) hypertension: Secondary | ICD-10-CM | POA: Diagnosis present

## 2019-09-22 DIAGNOSIS — Z23 Encounter for immunization: Secondary | ICD-10-CM | POA: Diagnosis not present

## 2019-09-22 DIAGNOSIS — N1832 Chronic kidney disease, stage 3b: Secondary | ICD-10-CM | POA: Diagnosis present

## 2019-09-22 MED ORDER — SODIUM CHLORIDE 0.9 % IV SOLN
INTRAVENOUS | Status: DC | PRN
  Administered 2019-09-22: 250 mL via INTRAVENOUS

## 2019-09-22 MED ORDER — METHYLPREDNISOLONE SODIUM SUCC 125 MG IJ SOLR: 125.0000 mg | Freq: Once | INTRAMUSCULAR | Status: DC | PRN

## 2019-09-22 MED ORDER — DIPHENHYDRAMINE HCL 50 MG/ML IJ SOLN: 50.0000 mg | Freq: Once | INTRAMUSCULAR | Status: DC | PRN

## 2019-09-22 MED ORDER — FAMOTIDINE IN NACL 20-0.9 MG/50ML-% IV SOLN: 20.0000 mg | Freq: Once | INTRAVENOUS | Status: DC | PRN

## 2019-09-22 MED ORDER — EPINEPHRINE 0.3 MG/0.3ML IJ SOAJ: 0.3000 mg | Freq: Once | INTRAMUSCULAR | Status: DC | PRN

## 2019-09-22 MED ORDER — SODIUM CHLORIDE 0.9 % IV SOLN
Freq: Once | INTRAVENOUS | Status: AC
  Filled 2019-09-22: qty 10

## 2019-09-22 MED ORDER — ALBUTEROL SULFATE HFA 108 (90 BASE) MCG/ACT IN AERS: 2.0000 | INHALATION_SPRAY | Freq: Once | RESPIRATORY_TRACT | Status: DC | PRN

## 2019-09-22 MED ORDER — ONDANSETRON HCL 4 MG/2ML IJ SOLN
4.0000 mg | Freq: Once | INTRAMUSCULAR | Status: AC
  Administered 2019-09-22: 4 mg via INTRAVENOUS
  Filled 2019-09-22: qty 2

## 2019-09-22 NOTE — Discharge Instructions (Signed)

## 2019-09-22 NOTE — Progress Notes (Signed)
  Diagnosis: COVID-19  Physician:dr wright   Procedure: [facility] Med: casirivimab\imdevimab infusion - Provided patient with casirivimab\imdevimab fact sheet for patients, parents and caregivers prior to infusion.  Complications: No immediate complications noted.  Discharge: Discharged home   [place] 09/22/2019

## 2019-09-24 ENCOUNTER — Encounter: Payer: Medicare (Managed Care) | Admitting: Cardiovascular Disease

## 2019-09-25 IMAGING — DX DG CHEST 1V PORT
1 series · 2 of 2 positions shown · non-contrast
Comparison: February 09, 2017

CLINICAL DATA: Shortness of breath.

EXAM:
PORTABLE CHEST 1 VIEW

[Series 1: chest · 0.14mm/px · 2 of 2 slices shown]
[im 1/2]
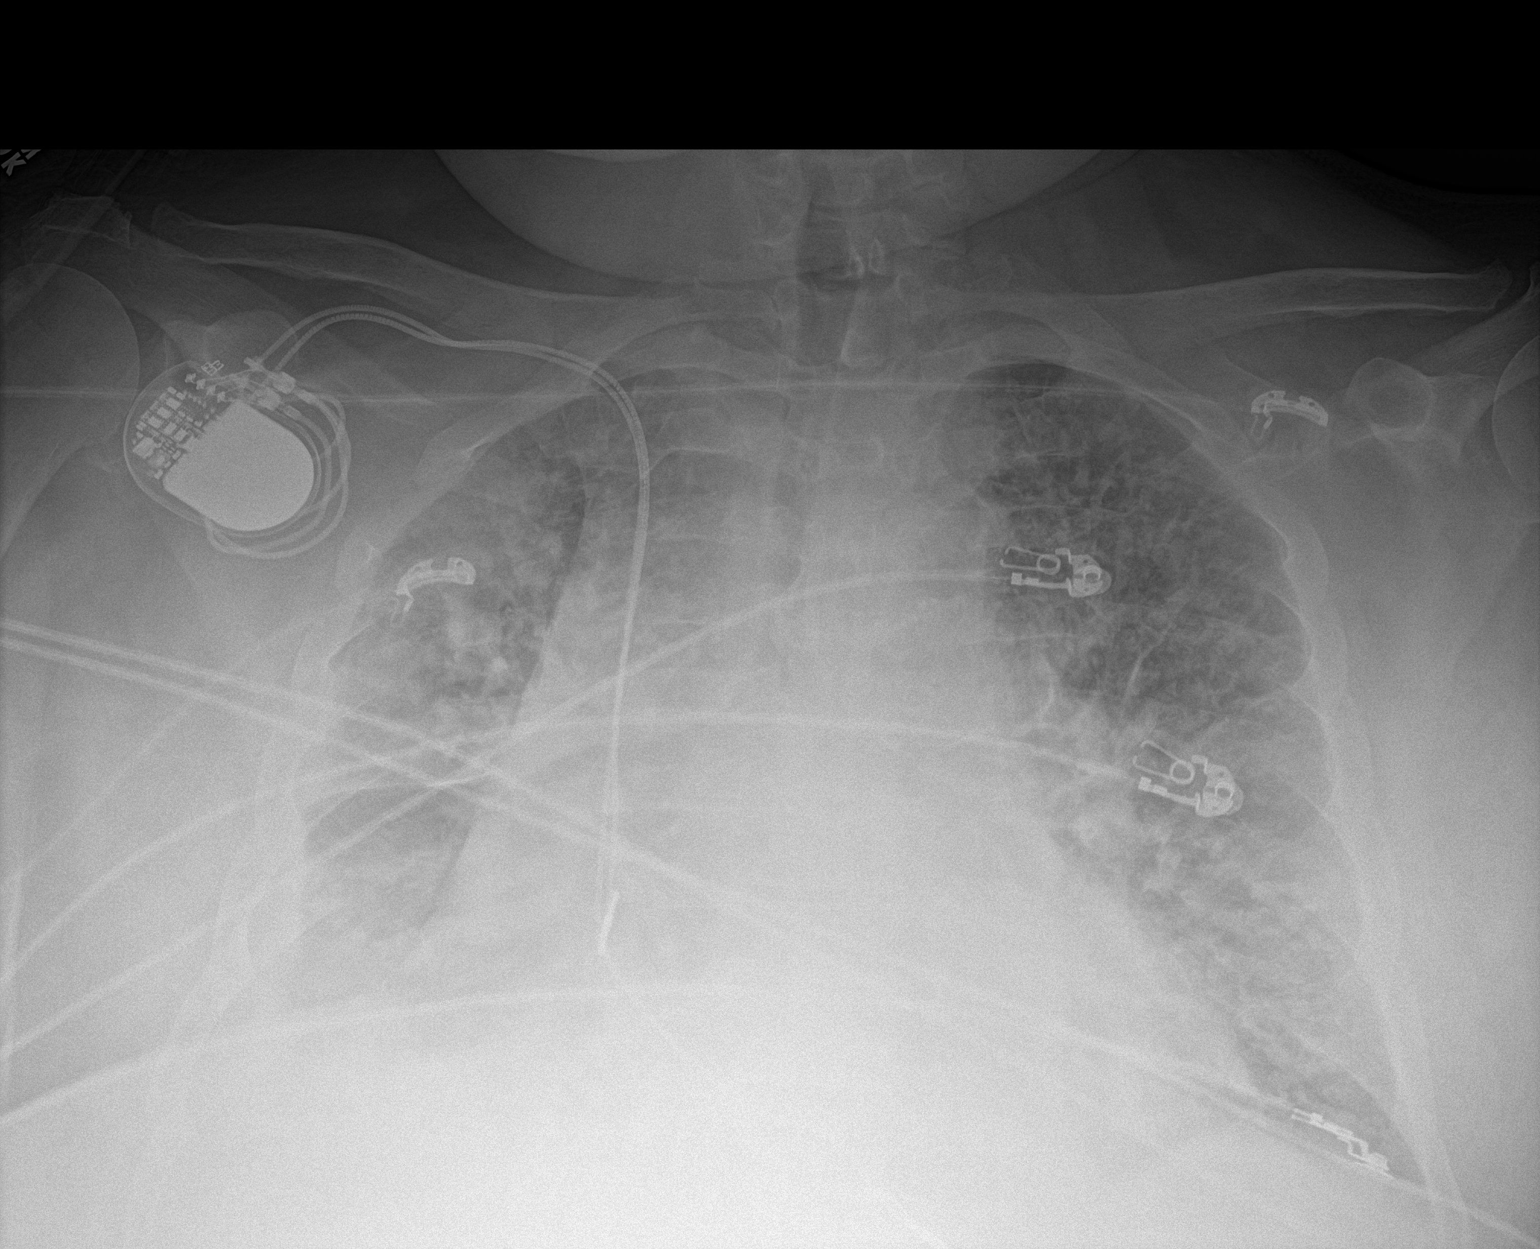
[im 2/2]
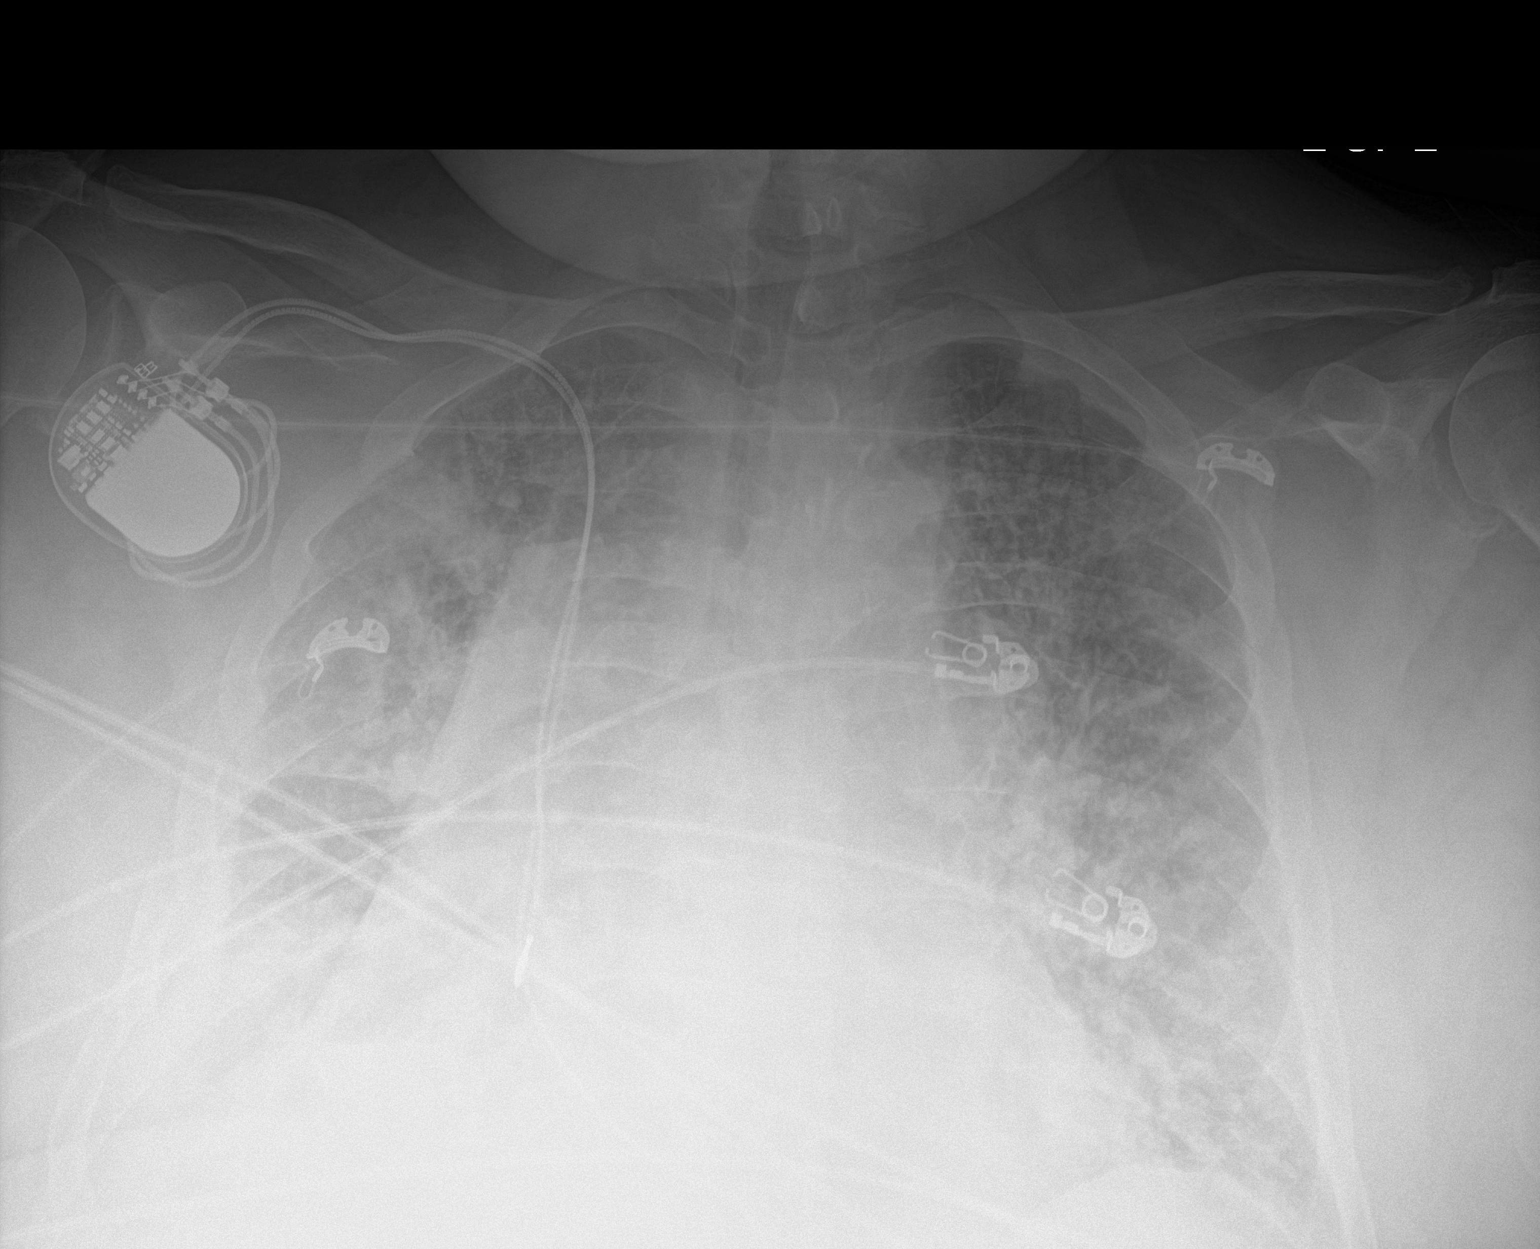

[2 of 2 positions shown; findings below may reference images not displayed]

FINDINGS: There is diffuse interstitial pulmonary edema. There is mild
alveolar edema in the right base. There is a right pleural effusion.
There is cardiomegaly with pulmonary venous hypertension. There is
aortic atherosclerosis. Pacemaker leads are attached to the right
atrium and right ventricle.
IMPRESSION: Right pleural effusion with diffuse interstitial and patchy alveolar
edema. Cardiomegaly with pulmonary venous hypertension. By
radiography, these are findings felt to be indicative of a degree of
congestive heart failure. Pacemaker leads attached to right atrium
and right ventricle. There is aortic atherosclerosis.

Aortic Atherosclerosis ([id number]).

## 2019-09-25 IMAGING — DX DG CHEST 1V PORT
1 series · 1 of 1 positions shown · non-contrast
Comparison: Radiographs June 13, 2017.

CLINICAL DATA: Status post intubation.

EXAM:
PORTABLE CHEST 1 VIEW

[chest]
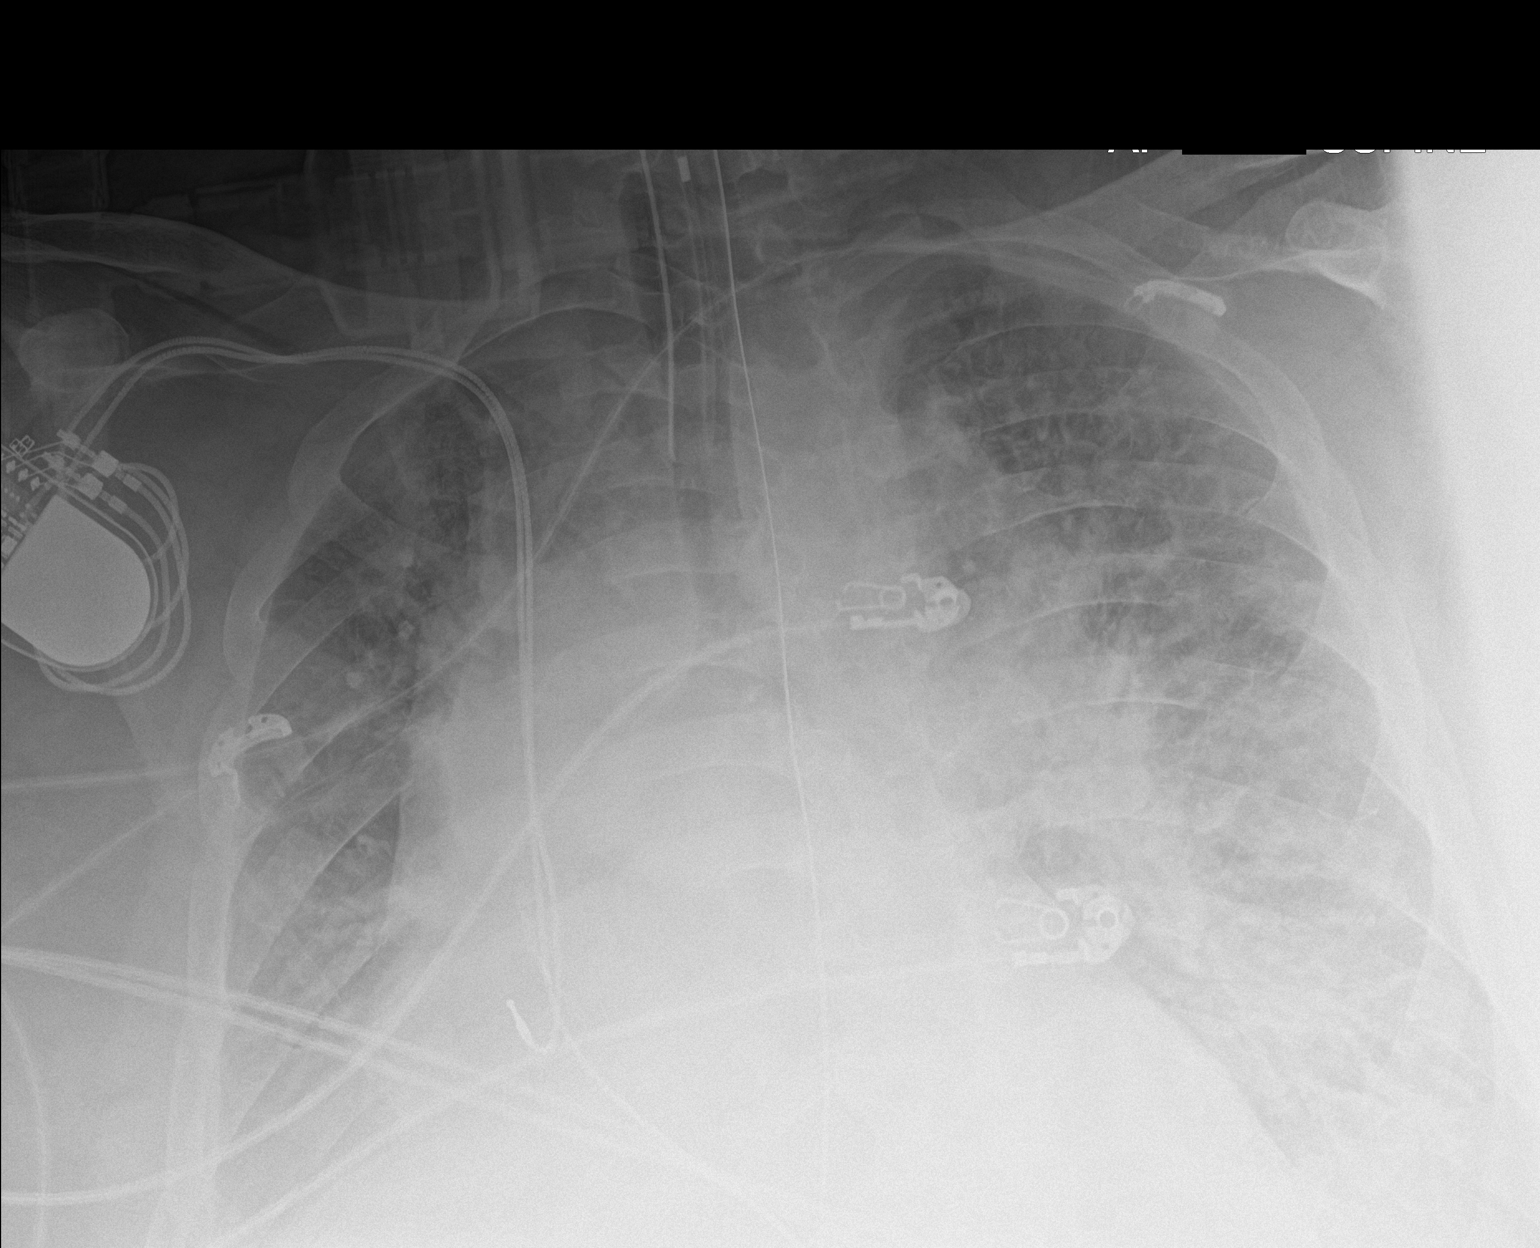

[1 of 1 positions shown; findings below may reference images not displayed]

FINDINGS: Stable cardiomegaly with central pulmonary vascular congestion.
Stable bilateral pulmonary edema is noted. No pneumothorax is noted.
Mild bilateral pleural effusions may be present. Right-sided
pacemaker is unchanged in position. Endotracheal tube is seen
projected over tracheal air shadow with distal tip 6 cm above the
carina. Nasogastric tube is seen passing into stomach. Bony thorax
is unremarkable.
IMPRESSION: Stable cardiomegaly with central pulmonary vascular congestion and
bilateral pulmonary edema. Endotracheal and nasogastric tubes are in
grossly good position.

## 2019-09-26 IMAGING — DX DG CHEST 1V PORT
2 series · 2 of 2 positions shown · non-contrast
Comparison: Portable chest x-ray June 13, 2017

CLINICAL DATA: Respiratory failure, COPD, cardiomyopathy, coronary
artery disease, morbid obesity, former smoker.

EXAM:
PORTABLE CHEST 1 VIEW

[chest ap (1 of 2)]
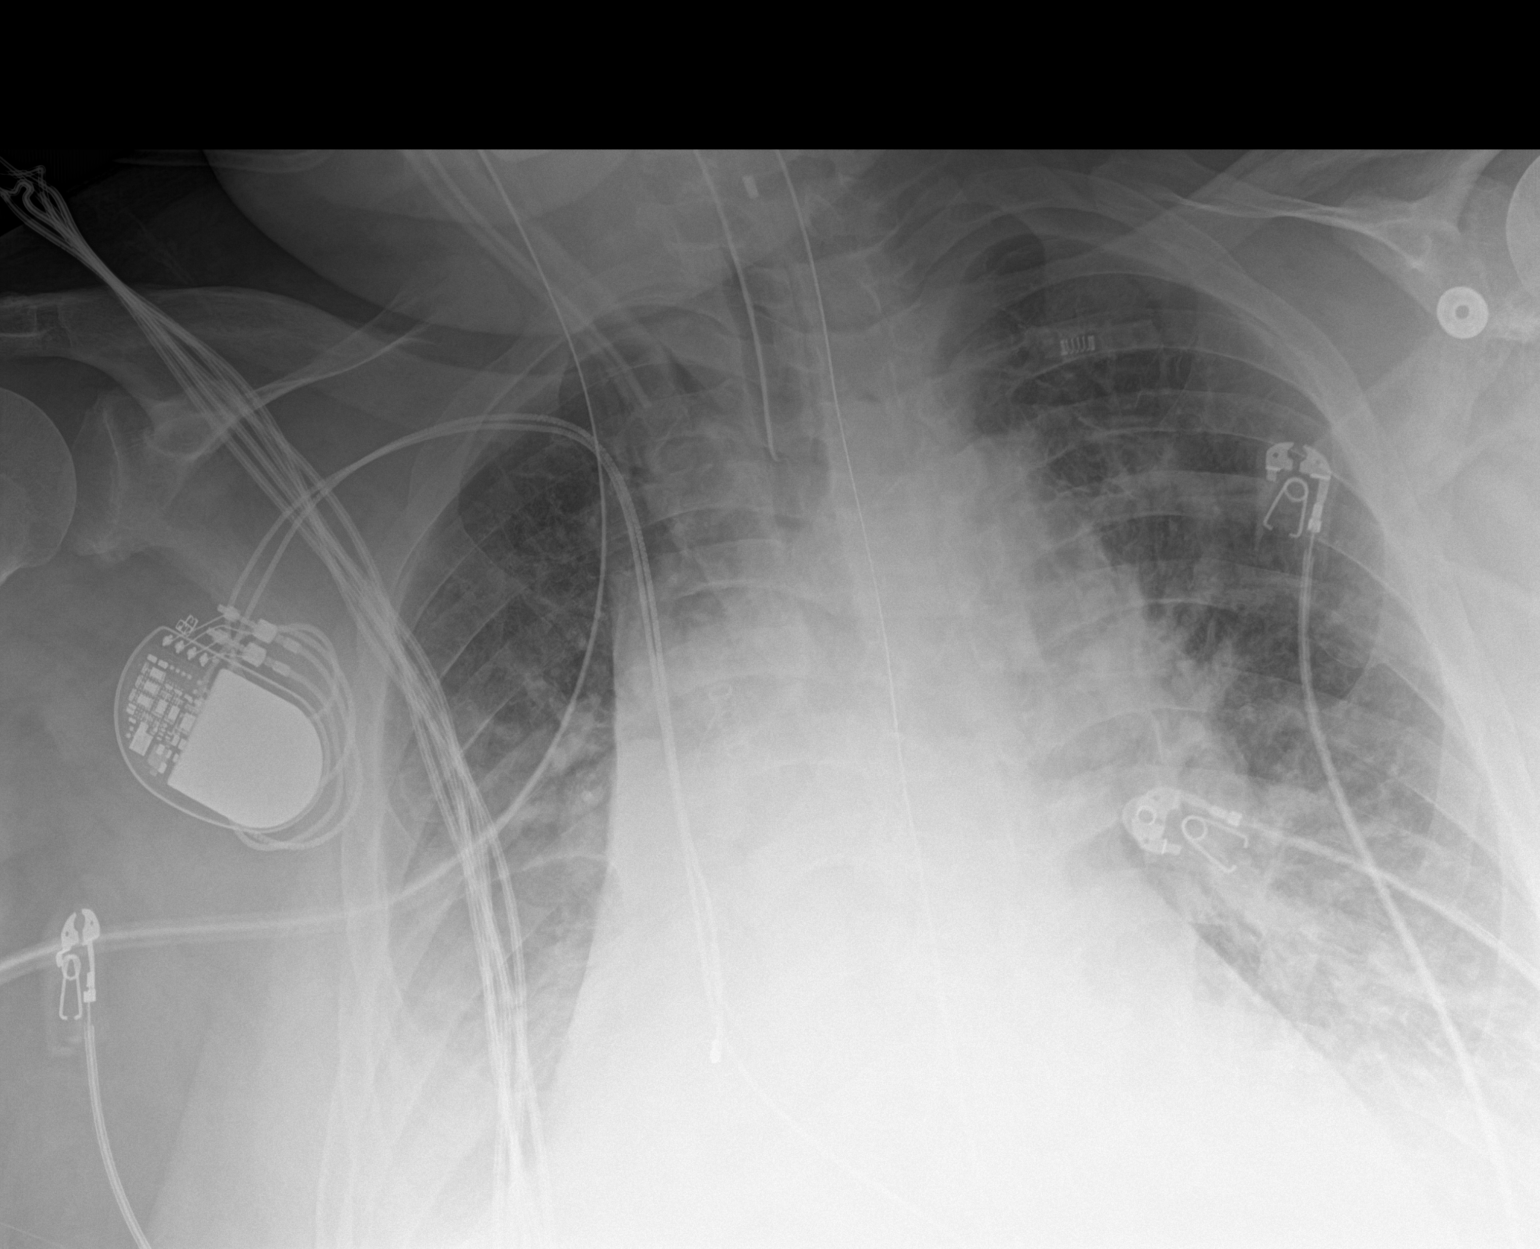

[chest ap (2 of 2)]
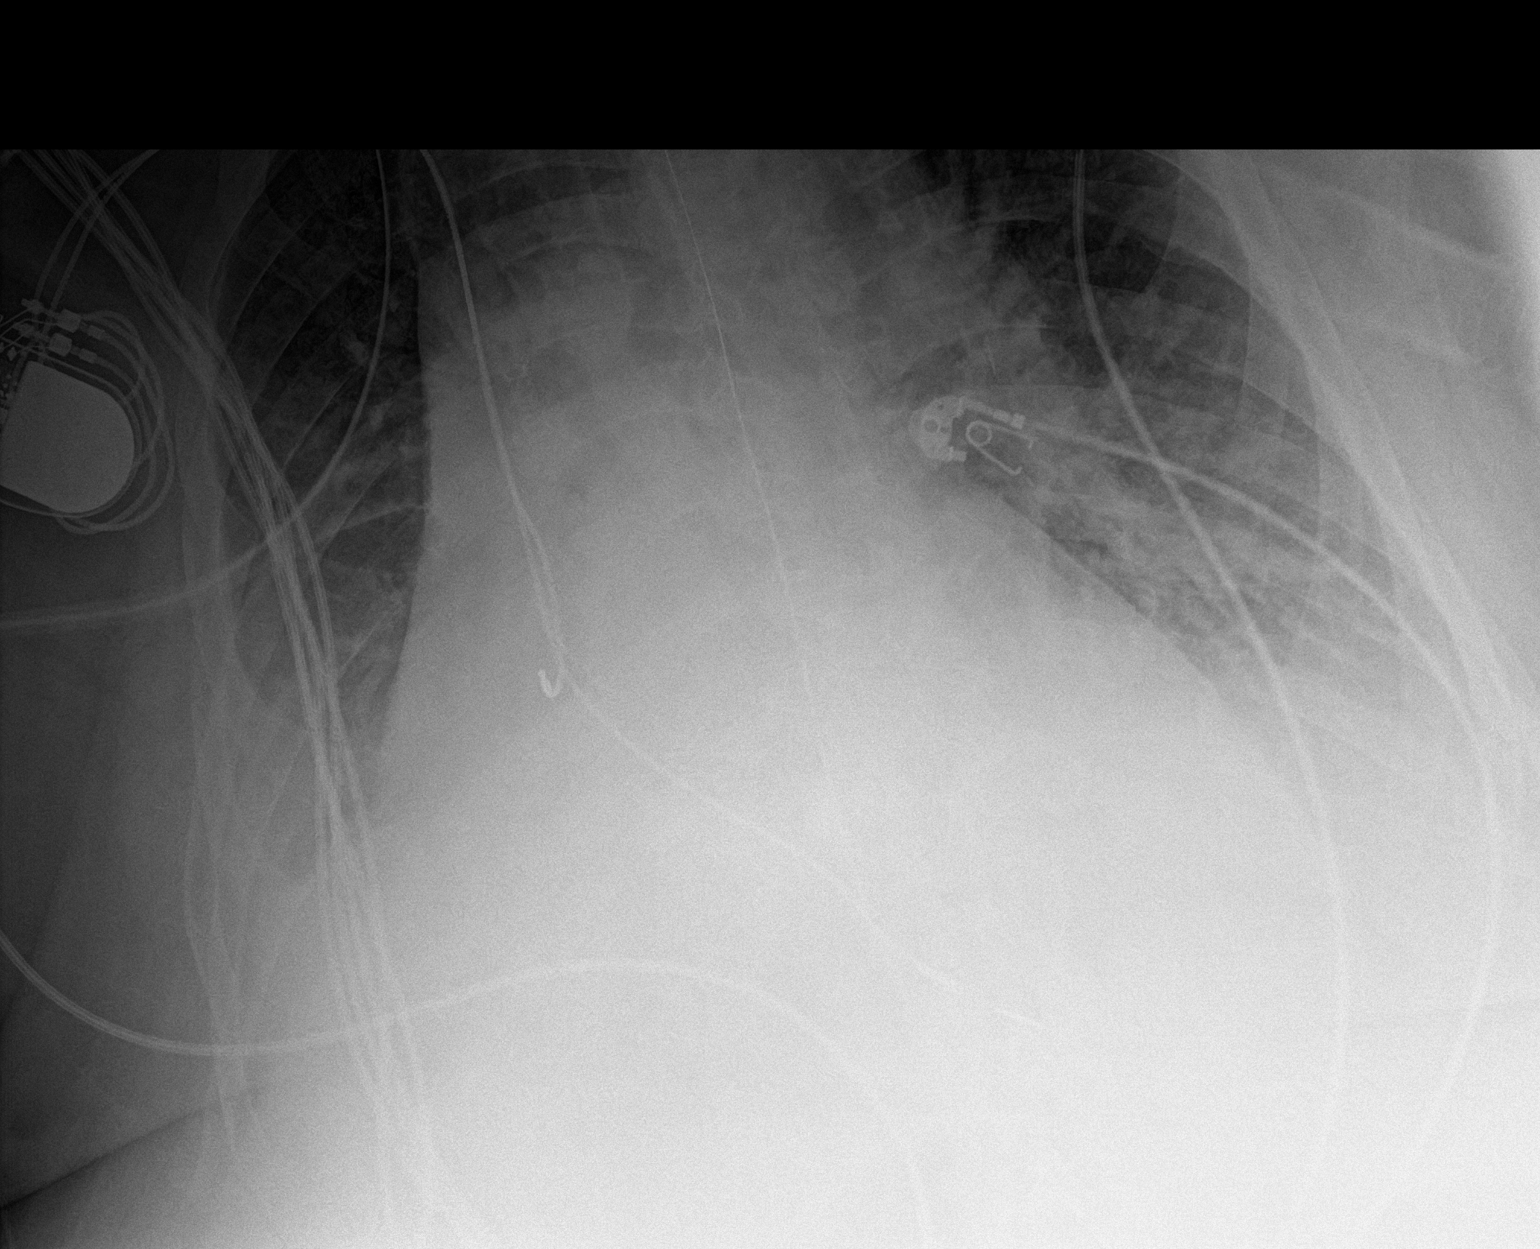

[2 of 2 positions shown; findings below may reference images not displayed]

FINDINGS: The lungs are reasonably well inflated. The pulmonary vascularity is
engorged and the interstitial markings are increased. The
hemidiaphragms remain obscured. The cardiac silhouette remains
enlarged. The endotracheal tube tip projects 7 cm above the carina.
The esophagogastric tube tip and proximal port project below the GE
junction.
IMPRESSION: CHF with pulmonary interstitial edema and bilateral pleural
effusions. There may been slight interval improvement since
yesterday's study.

The support tubes are in stable position.

## 2019-09-27 IMAGING — DX DG CHEST 1V PORT
1 series · 1 of 1 positions shown · non-contrast
Comparison: Chest radiograph dated 06/14/2017

CLINICAL DATA: 71-year-old male with endotracheal and enteric tube
placement.

EXAM:
PORTABLE CHEST 1 VIEW

[chest ap]
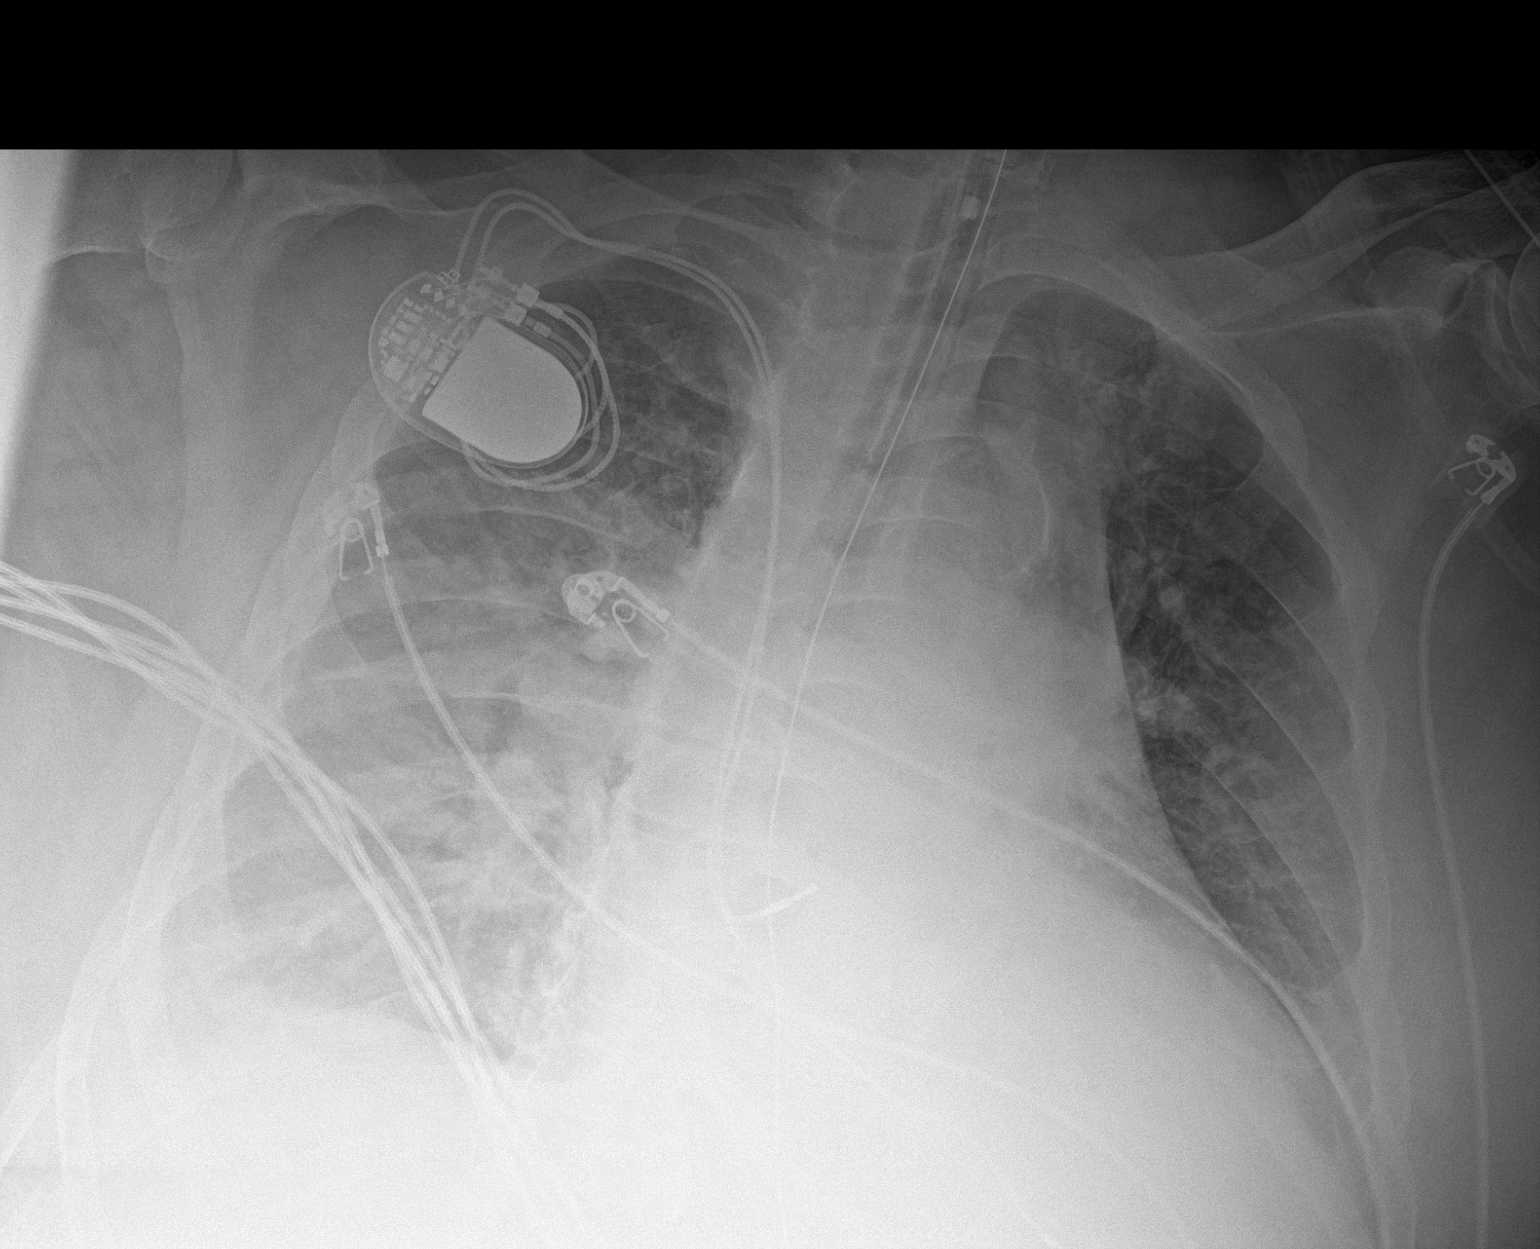

[1 of 1 positions shown; findings below may reference images not displayed]

FINDINGS: Endotracheal tube in stable position above the carina. Enteric tube
extends into the left hemiabdomen with side-port at the level of the
GE junction. The tip of the tube is beyond the inferior margin of
the image. The tube can be further advanced into the stomach and
positioning of the tip better assessed on an abdominal radiograph.

There is again cardiomegaly with vascular congestion and a small
right pleural effusion. The left costophrenic angle has been
excluded from the image. Patchy area of airspace density in the mid
to lower lung field on the right may represent edema or pneumonia.
Overall there is progression of pulmonary airspace density compared
to the earlier radiograph. No pneumothorax. There is atherosclerotic
calcification of the aortic arch. Right pectoral pacemaker device.
IMPRESSION: 1. Cardiomegaly with vascular congestion and pulmonary edema.
Overall interval progression of CHF compared to the prior study. An
area of airspace opacity involving the right mid to lower lung field
may represent changes of CHF although pneumonia is not excluded.
Clinical correlation is recommended.
2. Stable positioning of the endotracheal and enteric tube.

## 2019-09-27 IMAGING — DX DG CHEST 1V PORT
1 series · 1 of 1 positions shown · non-contrast
Comparison: Portable chest x-ray of earlier today

CLINICAL DATA: Status post central line placement

EXAM:
PORTABLE CHEST 1 VIEW

[chest ap]
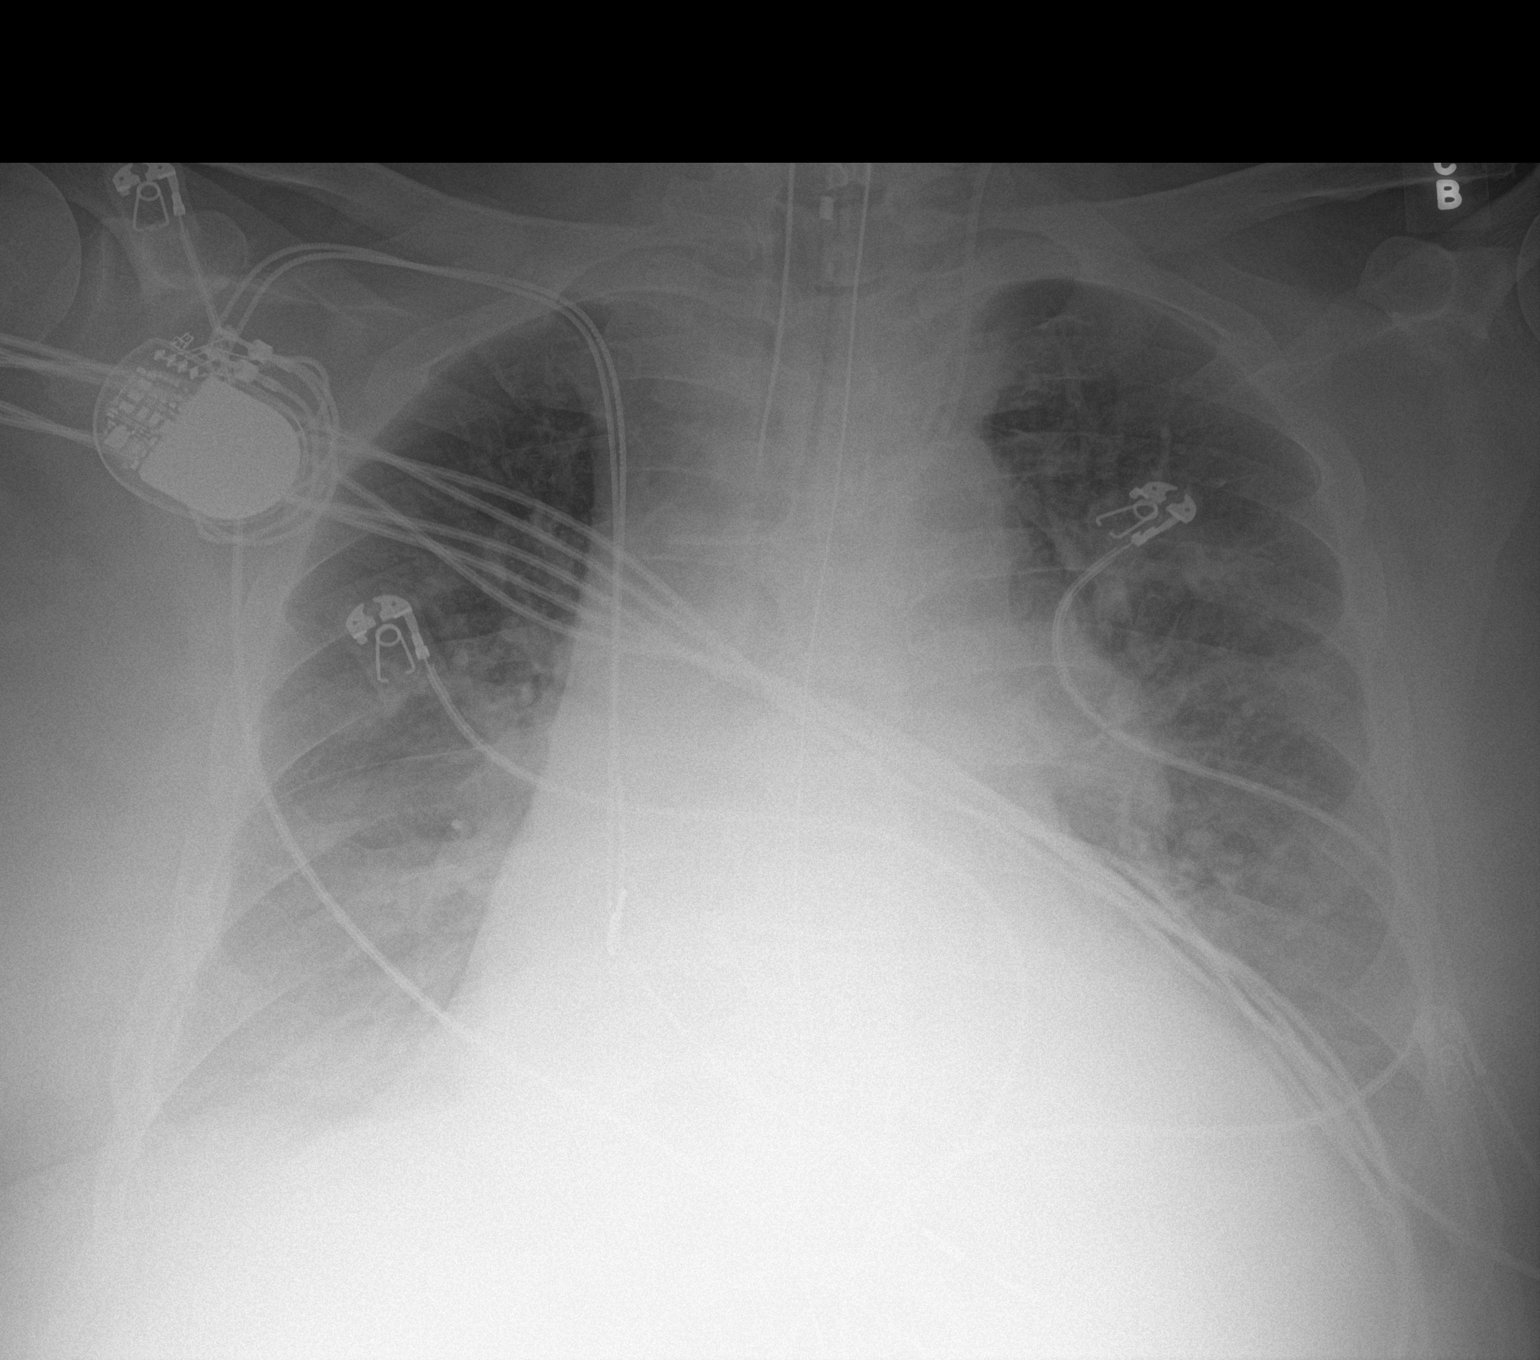

[1 of 1 positions shown; findings below may reference images not displayed]

FINDINGS: There has been placement of a left internal jugular venous catheter.
The tip projects over the proximal SVC at the junction of the right
and left brachiocephalic veins. There is no postprocedure
pneumothorax. The cardiac silhouette remains enlarged. The pulmonary
vascularity remains engorged and indistinct. There are probable
bilateral pleural effusions layering posteriorly. The endotracheal
tube tip lies 5.8 cm above the carina. The esophagogastric tube tip
projects below the inferior margin of the image. The ICD is in
stable position.
IMPRESSION: No postprocedure complication following placement of the left
internal jugular venous catheter.

## 2019-09-28 IMAGING — DX DG CHEST 1V PORT
1 series · 1 of 1 positions shown · non-contrast
Comparison: 06/15/2017

CLINICAL DATA: Respiratory failure.

EXAM:
PORTABLE CHEST 1 VIEW

[chest ap]
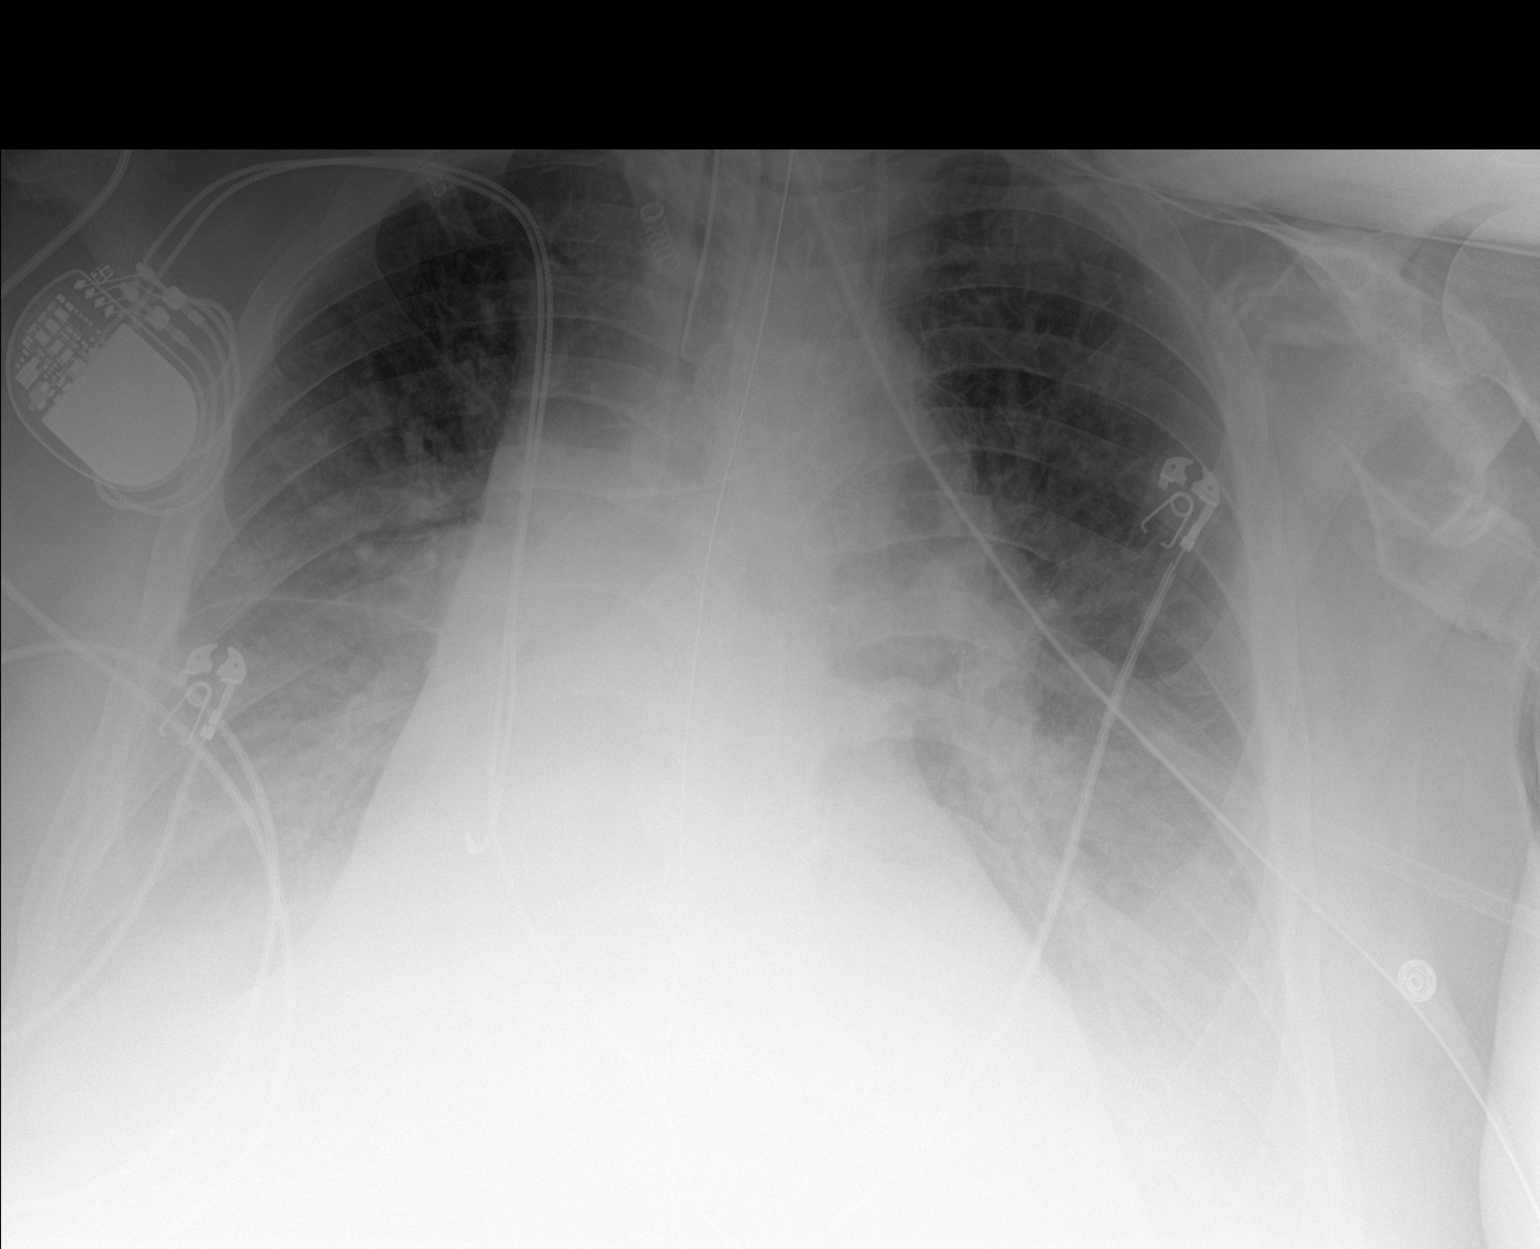

[1 of 1 positions shown; findings below may reference images not displayed]

FINDINGS: The support apparatus is stable. Persistent cardiac enlargement,
pulmonary edema and pleural effusions.
IMPRESSION: Stable support apparatus.

Persistent pulmonary edema and pleural effusions.

## 2019-09-29 IMAGING — DX DG CHEST 1V PORT
1 series · 1 of 1 positions shown · non-contrast
Comparison: 06/16/2017

CLINICAL DATA: Respiratory failure

EXAM:
PORTABLE CHEST 1 VIEW

[chest ap]
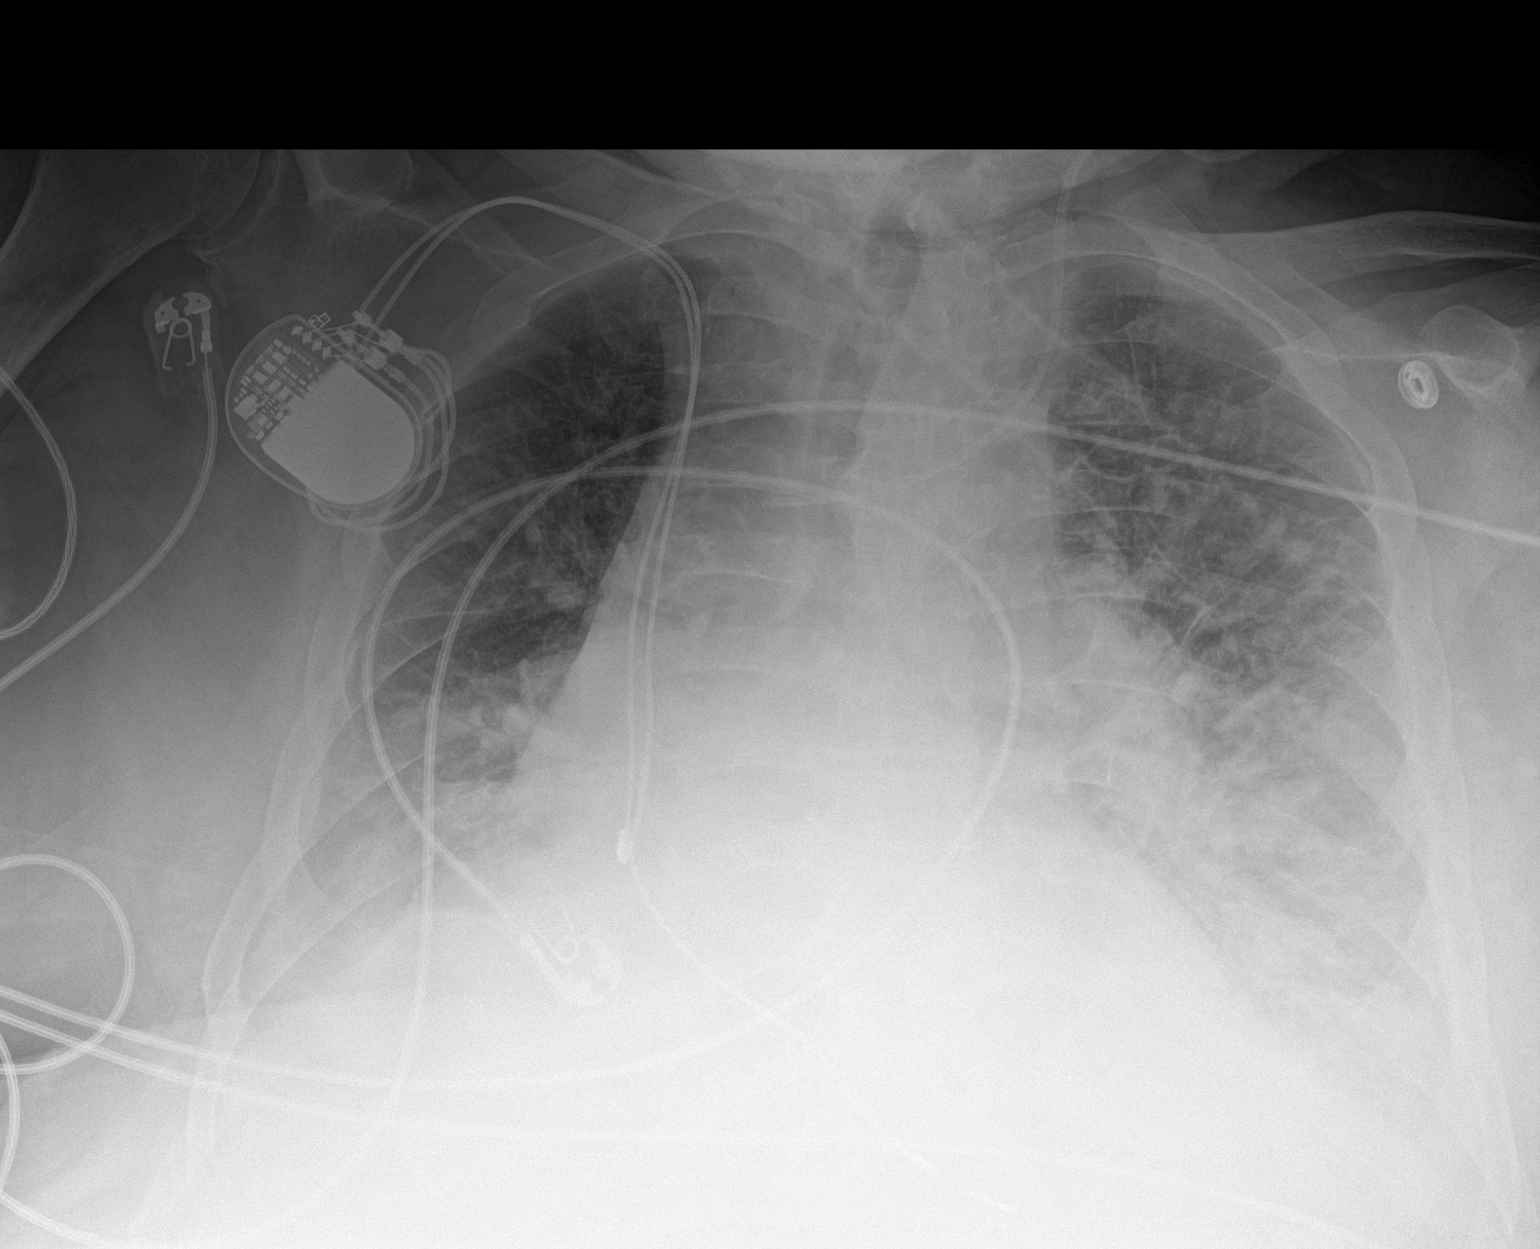

[1 of 1 positions shown; findings below may reference images not displayed]

FINDINGS: Right subclavian pacemaker device and leads are stable. Left jugular
central venous catheter stable. Endotracheal and NG tubes removed.
Vascular congestion is stable. Opacity in the mid and lower lung
zones is stable likely combination of pleural fluid and airspace
disease. Increasing interstitial prominence suggests worsening
interstitial edema. Stable cardiomegaly. No pneumothorax.
IMPRESSION: Extubated.

Worsening pulmonary edema.

## 2019-10-01 ENCOUNTER — Encounter (HOSPITAL_COMMUNITY): Payer: Medicare (Managed Care)

## 2019-10-02 IMAGING — CT CT CHEST HIGH RESOLUTION W/O CM
2 of 6 series · 13 of 36 positions shown, 16 images · non-contrast
Comparison: Chest radiograph from earlier today. 10/04/2013 chest
CT angiogram.

CLINICAL DATA: Inpatient. Dyspnea. Clinical concern for
interstitial lung disease.

EXAM:
CT CHEST WITHOUT CONTRAST
TECHNIQUE: Multidetector CT imaging of the chest was performed following the
standard protocol without intravenous contrast. High resolution
imaging of the lungs, as well as inspiratory and expiratory imaging,
was performed.

[Series 3: chestw/o 2.0 br40 3 · axial · 0.81mm/px · z∈[+1152,+1378]mm · 10 of 139 slices shown, 13 images]
[im 13/139  mediastinal]
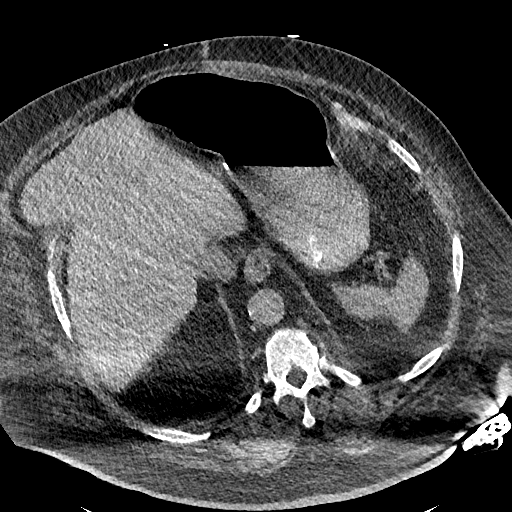
[im 13/139  lung]
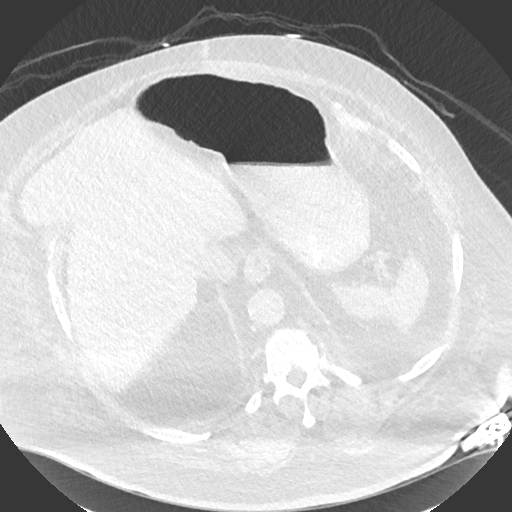
[im 26/139  lung]
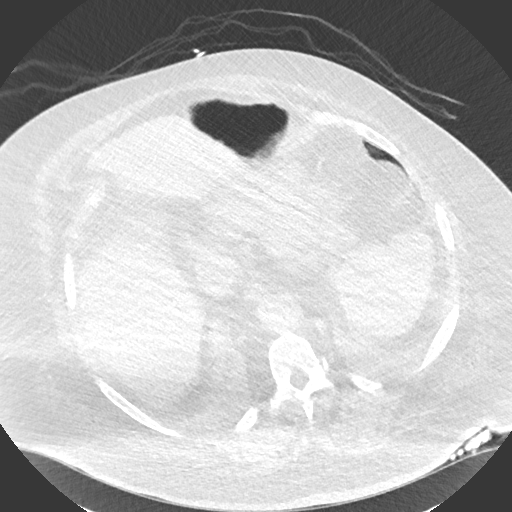
[im 38/139  lung]
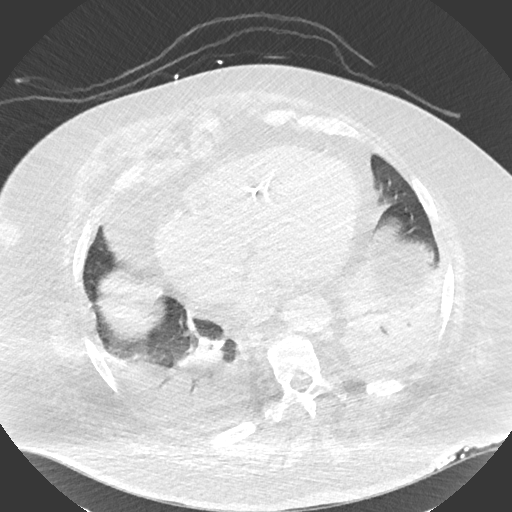
[im 51/139  lung]
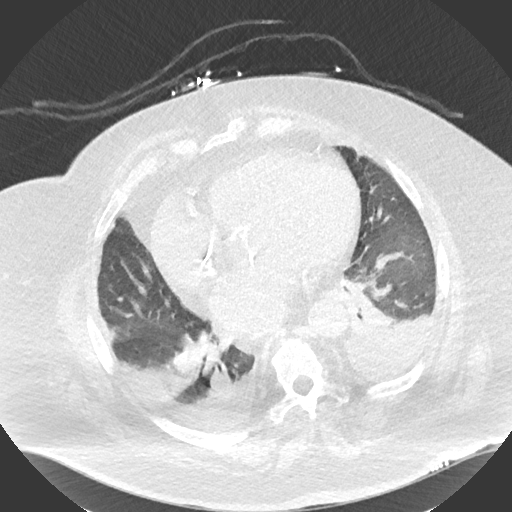
[im 63/139  mediastinal]
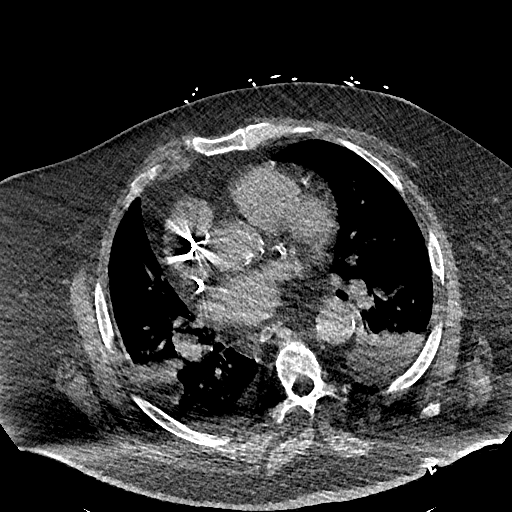
[im 63/139  lung]
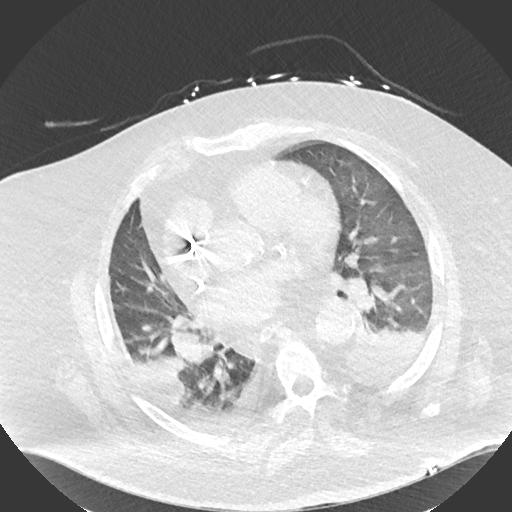
[im 76/139  lung]
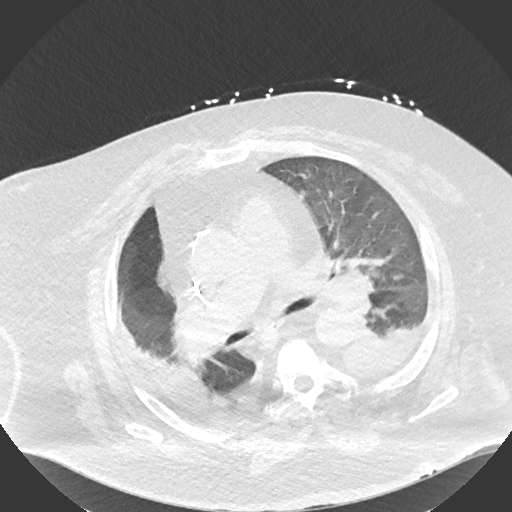
[im 88/139  lung]
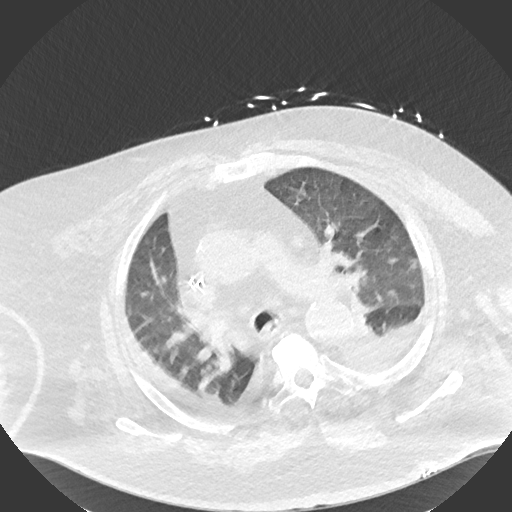
[im 101/139  lung]
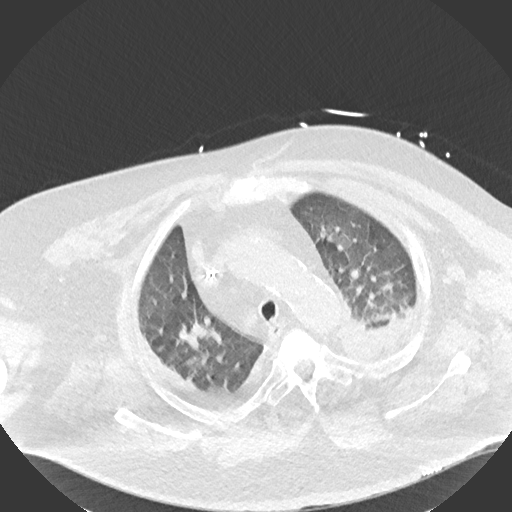
[im 113/139  mediastinal]
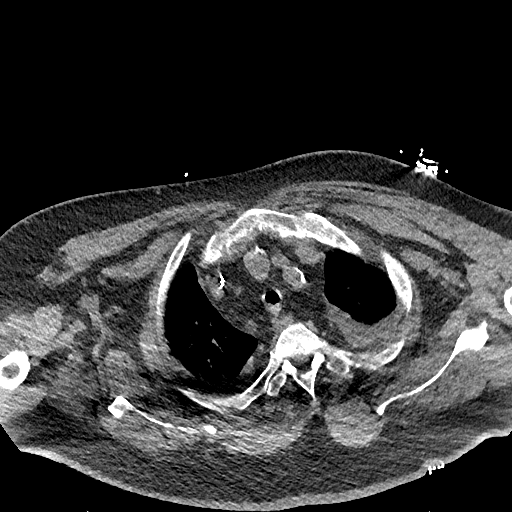
[im 113/139  lung]
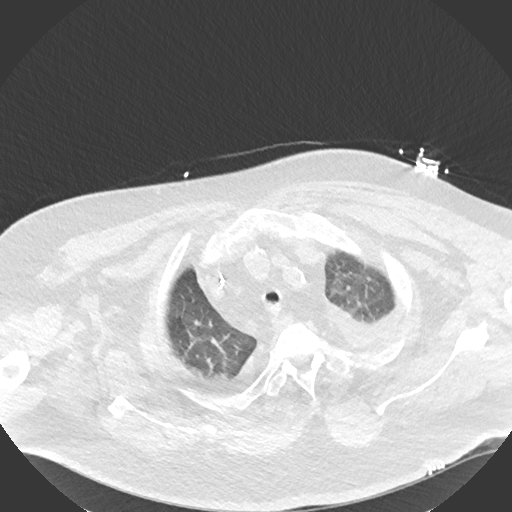
[im 126/139  lung]
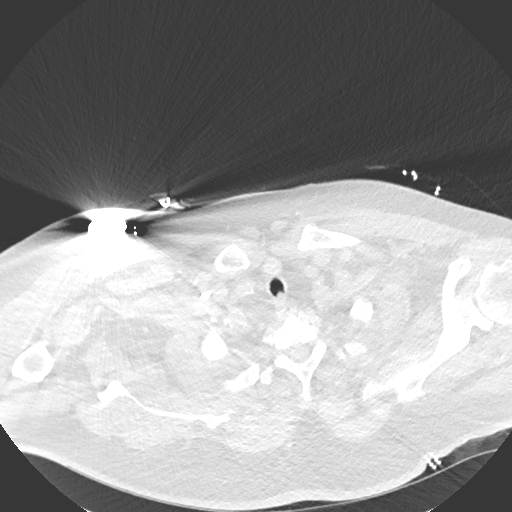

[Series 7: chestw/o 3.0 mpr cor · coronal · 0.56mm/px · 3 of 94 slices shown]
[im 19/94  lung]
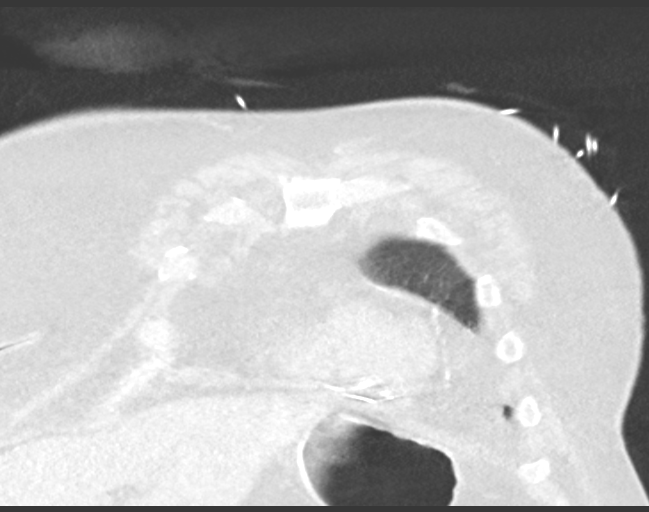
[im 38/94  lung]
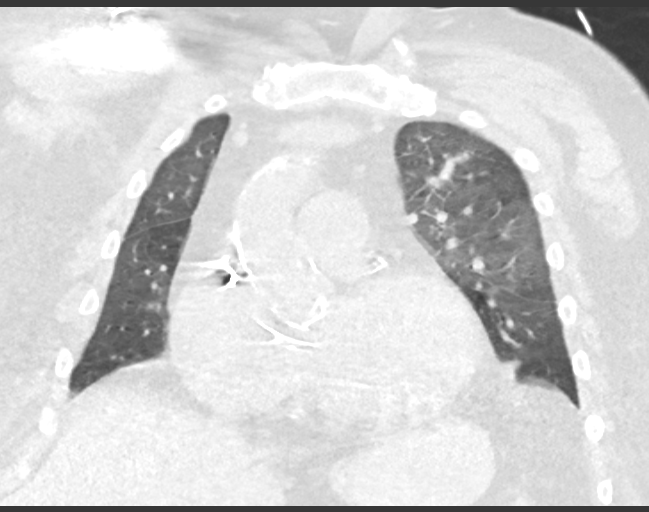
[im 56/94  lung]
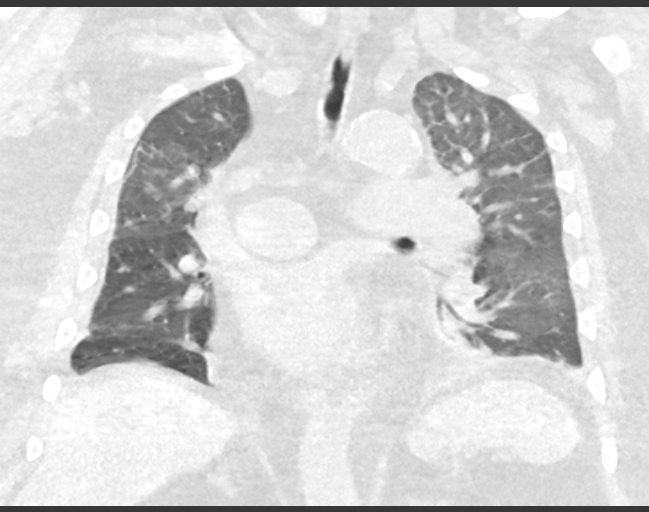

[13 of 36 positions shown; findings below may reference images not displayed]

FINDINGS: Motion degraded scan.

Cardiovascular: Moderate cardiomegaly. No significant pericardial
fluid/thickening. Left anterior descending, left circumflex and
right coronary atherosclerosis. Two lead right subclavian pacemaker
is noted with lead tips in the right atrium and right ventricular
apex. Atherosclerotic thoracic aorta with ectatic 4.0 cm ascending
thoracic aorta, stable. Dilated main pulmonary artery (4.0 cm
diameter, increased.

Mediastinum/Nodes: No discrete thyroid nodules. Unremarkable
esophagus. No axillary adenopathy. Newly mildly enlarged 1.5 cm
subcarinal node (series 3/image 70). No additional pathologically
enlarged mediastinal or discrete hilar nodes on this noncontrast
scan.

Lungs/Pleura: No pneumothorax. Small dependent bilateral pleural
effusions. Focal narrowing of the midthoracic tracheal lumen by
presumed mucoid debris (series 6/ image 30) . Moderate compressive
atelectasis in the dependent lower lobes bilaterally. Prominent
patchy ground-glass attenuation and interlobular septal thickening
throughout the aerated portions of the lungs bilaterally. Otherwise
no acute consolidative airspace disease, lung masses or significant
pulmonary nodules in the aerated portions of the lungs. No
significant regions of traction bronchiectasis or architectural
distortion. No frank honeycombing. No significant air trapping on
the expiration sequence.

Upper abdomen: No acute abnormality.

Musculoskeletal: No aggressive appearing focal osseous lesions. Mild
thoracic spondylosis.
IMPRESSION: 1. No findings suggestive of interstitial lung disease on this
limited motion degraded scan.
2. Spectrum of findings compatible with congestive heart failure.
Moderate cardiomegaly. Small dependent bilateral pleural effusions.
Diffuse patchy ground-glass attenuation and interlobular septal
thickening compatible with pulmonary edema.
3. Moderate bilateral lower lobe atelectasis.
4. Focal narrowing of the midthoracic trachea lumen by presumed
mucoid debris.
5. New mild mediastinal lymphadenopathy, nonspecific, probably
reactive.
6. Three-vessel coronary atherosclerosis.
7. Ectatic 4.0 cm ascending thoracic aorta. Recommend annual imaging
followup by CTA or MRA. This recommendation follows 5202
ACCF/AHA/AATS/ACR/ASA/SCA/NANCY/MING/BE VARDO/FABRE Guidelines for the
Diagnosis and Management of Patients with Thoracic Aortic Disease.
Circulation. 5202; 121: [id number].

Aortic Atherosclerosis ([id number]-EWF.F).

## 2019-10-02 IMAGING — DX DG CHEST 1V PORT
1 series · 1 of 1 positions shown · non-contrast
Comparison: June 19, 2017

CLINICAL DATA: Shortness of Breath

EXAM:
PORTABLE CHEST 1 VIEW

[chest ap]
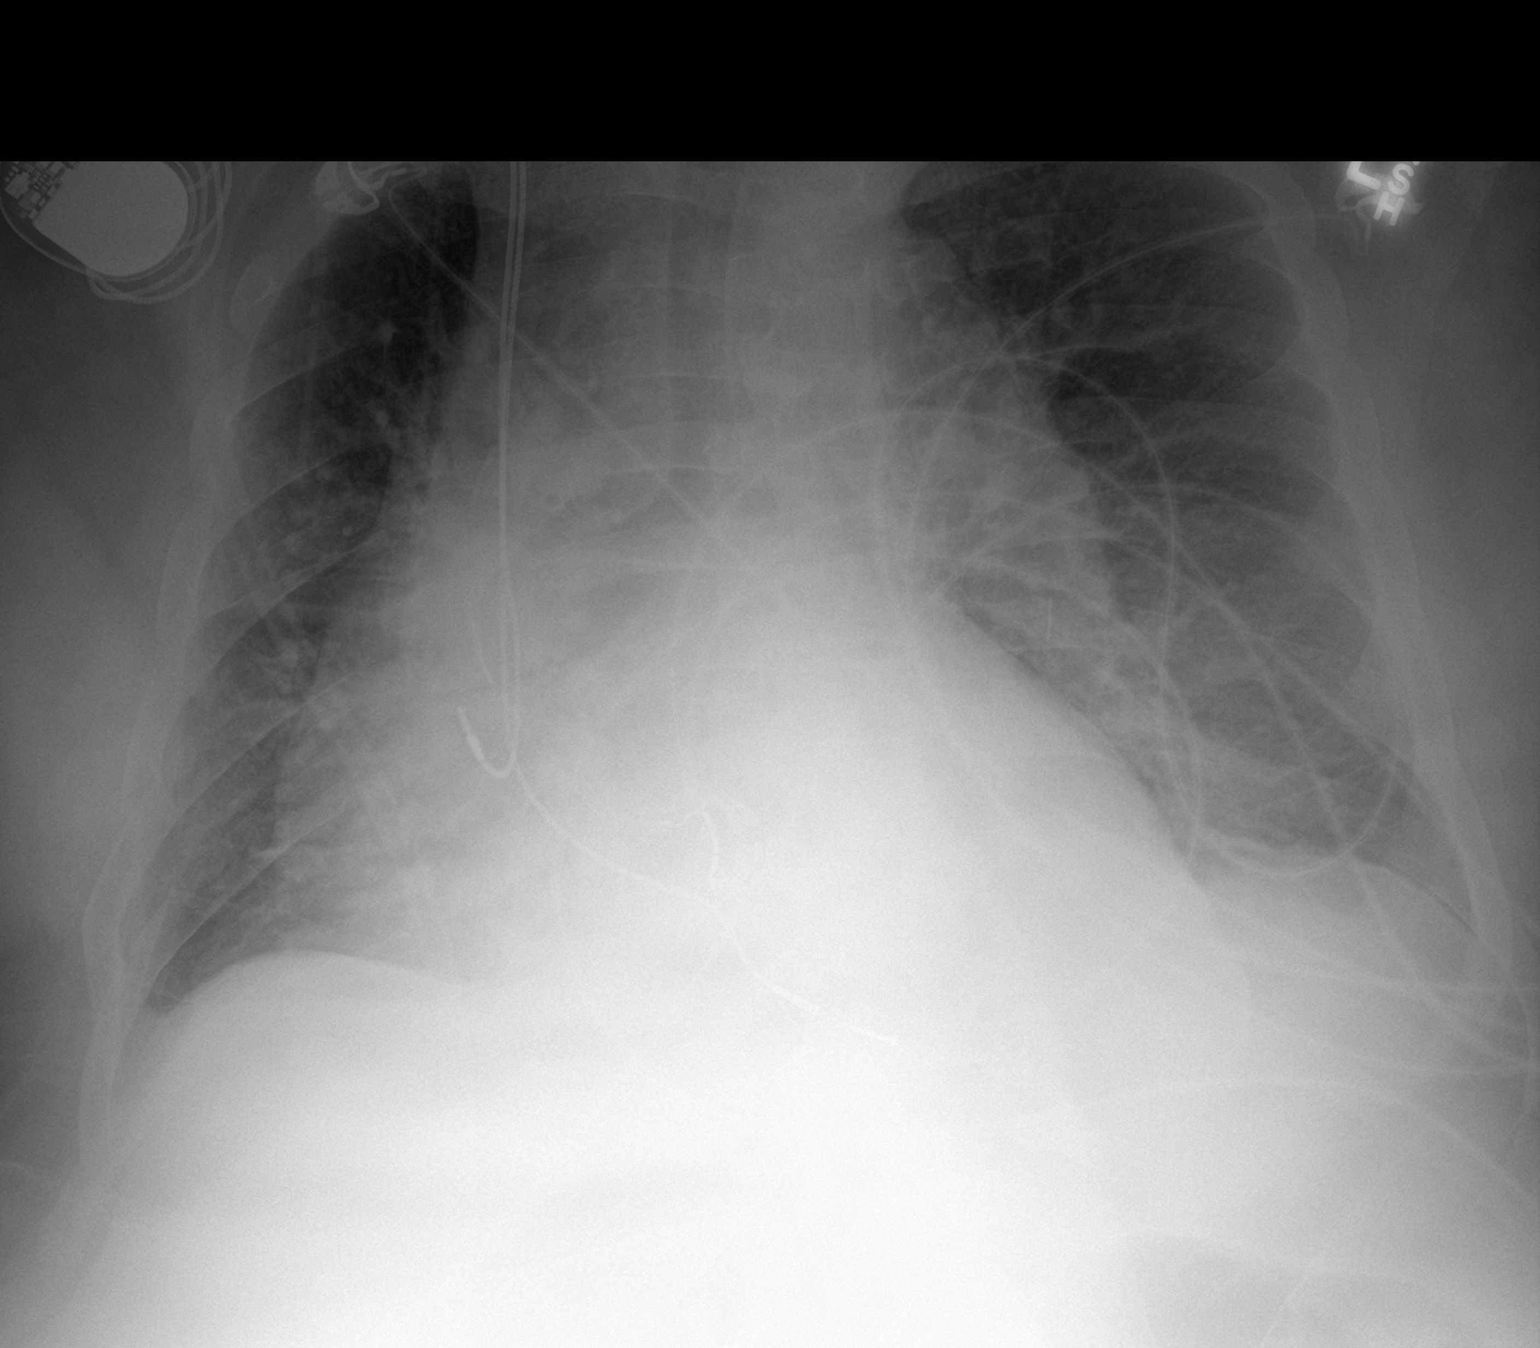

[1 of 1 positions shown; findings below may reference images not displayed]

FINDINGS: Pacemaker leads are attached to the right atrium and right
ventricle. There is cardiomegaly with pulmonary venous hypertension.
There is aortic atherosclerosis.

There is a small right pleural effusion. There is no edema or
consolidation. No adenopathy evident. No pneumothorax. No bone
lesions.
IMPRESSION: Pulmonary venous hypertension. Aortic atherosclerosis. Small right
pleural effusion. No edema or consolidation evident.

Aortic Atherosclerosis (7322I-YS3.3).

## 2019-10-03 IMAGING — DX DG CHEST 1V PORT
1 series · 1 of 1 positions shown · non-contrast
Comparison: Chest x-ray and chest CT scan June 20, 2017

CLINICAL DATA: Suspect pulmonary edema.

EXAM:
PORTABLE CHEST 1 VIEW

[chest ap]
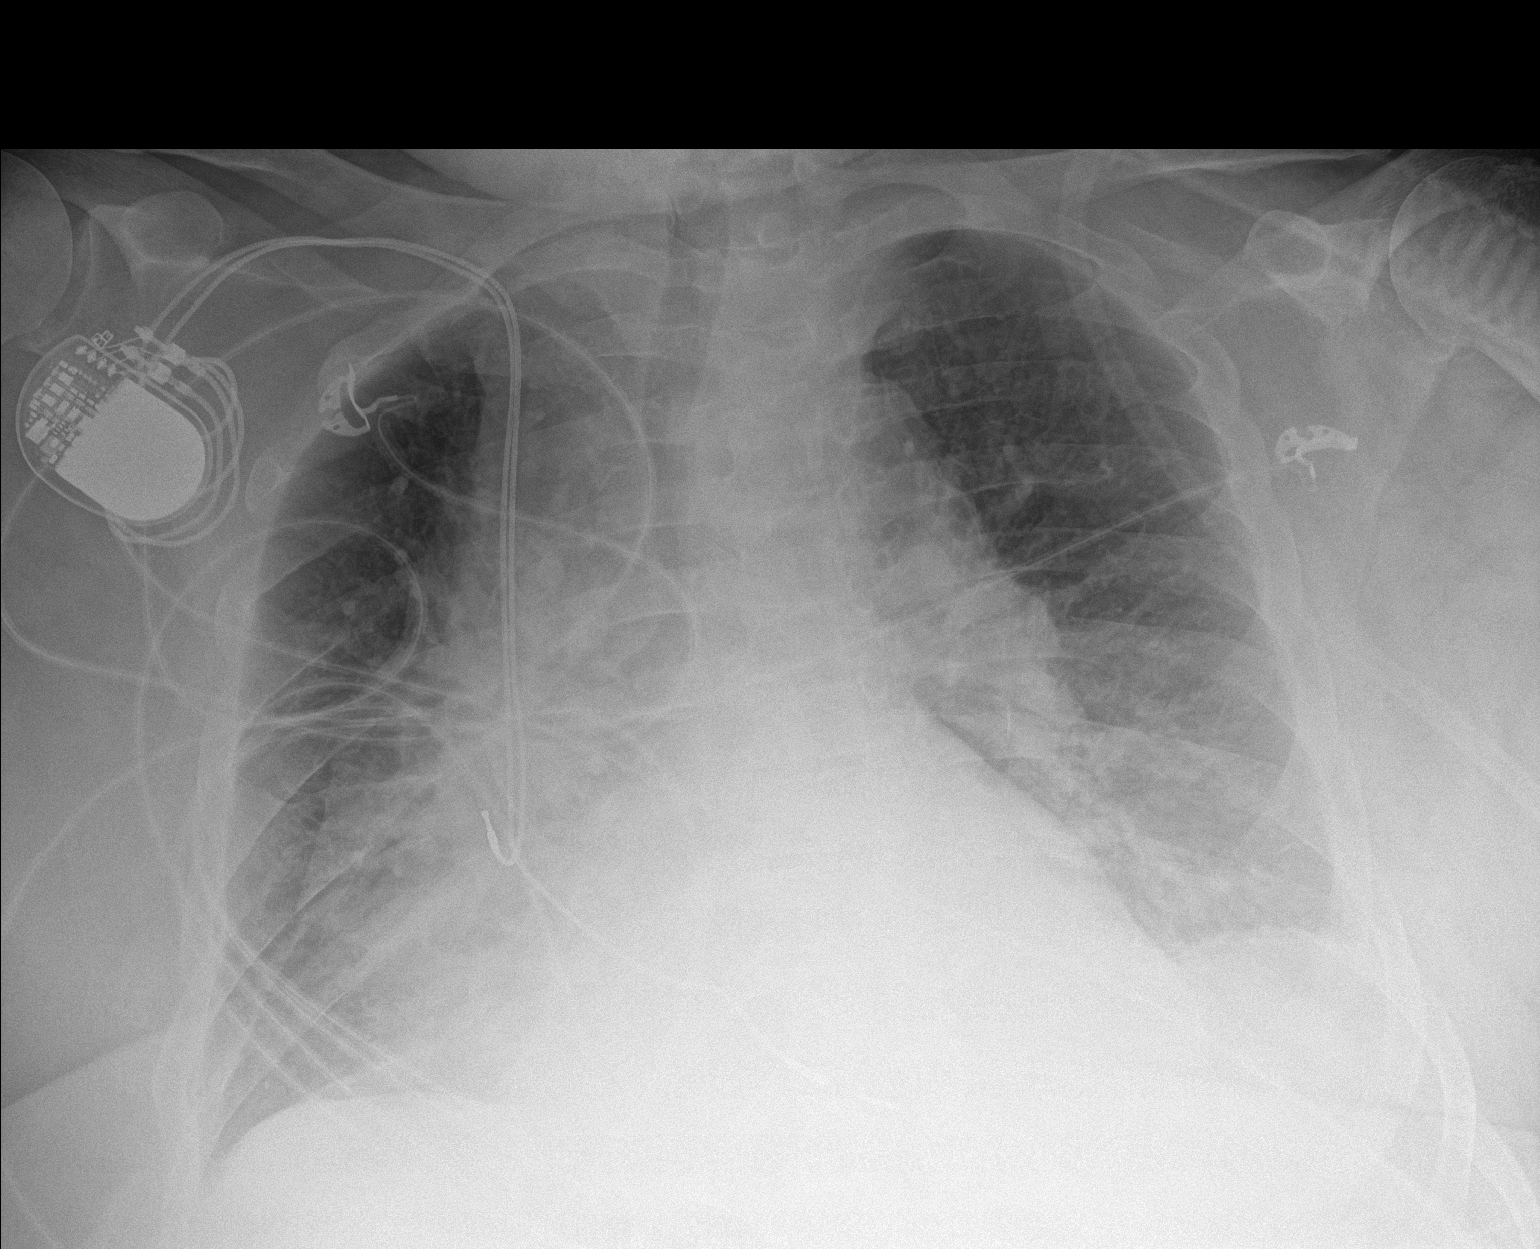

[1 of 1 positions shown; findings below may reference images not displayed]

FINDINGS: The lungs are adequately inflated. The interstitial markings remain
increased. The cardiac silhouette is enlarged. The pulmonary
vascularity is engorged. There is tortuosity of the descending
thoracic aorta. The ICD is in stable position.
IMPRESSION: CHF with mild interstitial edema. Cardiomegaly. Thoracic aortic
atherosclerosis. No significant change since yesterday's study.

## 2019-10-15 ENCOUNTER — Other Ambulatory Visit: Payer: Self-pay

## 2019-10-15 ENCOUNTER — Ambulatory Visit (HOSPITAL_COMMUNITY)
Admission: RE | Admit: 2019-10-15 | Discharge: 2019-10-15 | Disposition: A | Discharge status: Home / Self Care | Payer: Medicare (Managed Care) | Source: Ambulatory Visit | Attending: Cardiology | Admitting: Cardiology

## 2019-10-15 VITALS — BP 152/76 | HR 70 | Wt 334.5 lb

## 2019-10-15 DIAGNOSIS — Z794 Long term (current) use of insulin: Secondary | ICD-10-CM | POA: Diagnosis not present

## 2019-10-15 DIAGNOSIS — Z953 Presence of xenogenic heart valve: Secondary | ICD-10-CM | POA: Insufficient documentation

## 2019-10-15 DIAGNOSIS — Z952 Presence of prosthetic heart valve: Secondary | ICD-10-CM

## 2019-10-15 DIAGNOSIS — I5032 Chronic diastolic (congestive) heart failure: Secondary | ICD-10-CM | POA: Diagnosis not present

## 2019-10-15 DIAGNOSIS — H548 Legal blindness, as defined in USA: Secondary | ICD-10-CM | POA: Insufficient documentation

## 2019-10-15 DIAGNOSIS — Z7902 Long term (current) use of antithrombotics/antiplatelets: Secondary | ICD-10-CM | POA: Diagnosis not present

## 2019-10-15 DIAGNOSIS — H3530 Unspecified macular degeneration: Secondary | ICD-10-CM | POA: Insufficient documentation

## 2019-10-15 DIAGNOSIS — Z8261 Family history of arthritis: Secondary | ICD-10-CM | POA: Insufficient documentation

## 2019-10-15 DIAGNOSIS — Z950 Presence of cardiac pacemaker: Secondary | ICD-10-CM | POA: Diagnosis not present

## 2019-10-15 DIAGNOSIS — Z8249 Family history of ischemic heart disease and other diseases of the circulatory system: Secondary | ICD-10-CM | POA: Diagnosis not present

## 2019-10-15 DIAGNOSIS — Z6841 Body Mass Index (BMI) 40.0 and over, adult: Secondary | ICD-10-CM | POA: Insufficient documentation

## 2019-10-15 DIAGNOSIS — N183 Chronic kidney disease, stage 3 unspecified: Secondary | ICD-10-CM | POA: Diagnosis not present

## 2019-10-15 DIAGNOSIS — I2510 Atherosclerotic heart disease of native coronary artery without angina pectoris: Secondary | ICD-10-CM | POA: Insufficient documentation

## 2019-10-15 DIAGNOSIS — R001 Bradycardia, unspecified: Secondary | ICD-10-CM | POA: Diagnosis not present

## 2019-10-15 DIAGNOSIS — Z87891 Personal history of nicotine dependence: Secondary | ICD-10-CM | POA: Diagnosis not present

## 2019-10-15 DIAGNOSIS — Z89432 Acquired absence of left foot: Secondary | ICD-10-CM | POA: Diagnosis not present

## 2019-10-15 DIAGNOSIS — E1122 Type 2 diabetes mellitus with diabetic chronic kidney disease: Secondary | ICD-10-CM | POA: Diagnosis not present

## 2019-10-15 DIAGNOSIS — Z79899 Other long term (current) drug therapy: Secondary | ICD-10-CM | POA: Diagnosis not present

## 2019-10-15 DIAGNOSIS — I130 Hypertensive heart and chronic kidney disease with heart failure and stage 1 through stage 4 chronic kidney disease, or unspecified chronic kidney disease: Secondary | ICD-10-CM | POA: Diagnosis not present

## 2019-10-15 DIAGNOSIS — Z9981 Dependence on supplemental oxygen: Secondary | ICD-10-CM | POA: Diagnosis not present

## 2019-10-15 DIAGNOSIS — I50810 Right heart failure, unspecified: Secondary | ICD-10-CM | POA: Diagnosis not present

## 2019-10-15 DIAGNOSIS — I480 Paroxysmal atrial fibrillation: Secondary | ICD-10-CM | POA: Insufficient documentation

## 2019-10-15 DIAGNOSIS — Z7982 Long term (current) use of aspirin: Secondary | ICD-10-CM | POA: Diagnosis not present

## 2019-10-15 DIAGNOSIS — E662 Morbid (severe) obesity with alveolar hypoventilation: Secondary | ICD-10-CM | POA: Diagnosis not present

## 2019-10-15 DIAGNOSIS — E1142 Type 2 diabetes mellitus with diabetic polyneuropathy: Secondary | ICD-10-CM | POA: Diagnosis not present

## 2019-10-15 LAB — BASIC METABOLIC PANEL
Anion gap: 12 (ref 5–15)
BUN: 76 mg/dL — ABNORMAL HIGH (ref 8–23)
CO2: 26 mmol/L (ref 22–32)
Calcium: 8.8 mg/dL — ABNORMAL LOW (ref 8.9–10.3)
Chloride: 96 mmol/L — ABNORMAL LOW (ref 98–111)
Creatinine, Ser: 2.39 mg/dL — ABNORMAL HIGH (ref 0.61–1.24)
GFR calc Af Amer: 30 mL/min — ABNORMAL LOW (ref >60)
GFR calc non Af Amer: 26 mL/min — ABNORMAL LOW (ref >60)
Glucose, Bld: 233 mg/dL — ABNORMAL HIGH (ref 70–99)
Potassium: 4.2 mmol/L (ref 3.5–5.1)
Sodium: 134 mmol/L — ABNORMAL LOW (ref 135–145)

## 2019-10-15 LAB — BRAIN NATRIURETIC PEPTIDE: B Natriuretic Peptide: 117.9 pg/mL — ABNORMAL HIGH (ref 0.0–100.0)

## 2019-10-15 MED ORDER — HYDRALAZINE HCL 100 MG PO TABS: 100.0000 mg | ORAL_TABLET | Freq: Three times a day (TID) | ORAL | 6 refills | Status: DC

## 2019-10-15 NOTE — Patient Instructions (Signed)
Increase Hydralazine to 100 mg Three times a day   Labs done today  Follow up in 3 months with Dr Aundra Dubin

## 2019-10-15 NOTE — Progress Notes (Addendum)
Patient ID: [name], male   DOB: [birth year], 74 y.o.   MRN: [id number]   [facility]  PCP: [name] Cardiology: Dr. Sallyanne Kuster HF Cardiology: Dr. Aundra Dubin Nephrologist: Dr. Joelyn Oms  Reason for Visit: f/u for Chronic Diastolic Heart Failure   [name] is a 74 y.o. 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   He was admitted in 11/18 with acute respiratory failure => intubated.  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  He was treated for acute on chronic diastolic CHF with IV diuresis.    Patient was admitted in 1/20 with acute on chronic diastolic CHF.  He was found to have severe aortic stenosis.  He was diuresed and developed AKI.    In 8/20, he had LHC showing nonobstructive CAD.  He then had TAVR in 9/20.  Last echo in 10/20 showed EF 55-60% with stable TAVR valve (mean gradient 15 mmHg).     He had uncomplicated COVID 19 infection early Feb 2021. Did not require hospitalization. Respiratory status remained stable.   He presents today for followup of CHF. He reports chronic exertional dyspnea, w/o change. Remains NYHA Class III. No significant change post-TAVR. He denies resting dyspnea. No orthopnea/PND. No chest pain, palpitations, dizziness, syncope/ near syncope. He lives home alone. Has home health aides daily for 6 hrs a day. His children also help from time to time. [place] aides assist w/ medications. He reports compliance and notes good urinary output w/ diuretics. BP is elevated today, 152/76. He has taken his am meds.     Labs (11/18): hgb 14.8, K 4.6, creatinine 2.34 Labs (08/17/2017): K 3.5 Creatinine 2.16  Labs (11/29/2017): K 3.4 Creatinine 2.52  Labs (6/19): K 3.6, creatinine 2.29 Labs (9/19): K 4.3, creatinine 1.93 Labs (1/20): K 4.4, creatinine 2.2, BUN 72 Labs (9/20): K 3.8,  creatinine 2.44  PMH: 1. Chronic diastolic CHF with RV dysfunction: Echo (2/15) with EF 60%, mild LVH, mild aortic stenosis with mean gradient 18 mmHg, RV mildly dilated with mildly decreased systolic function.  - Echo (10/18): EF 60-65%, moderate aortic stenosis.  - Echo (8/19): EF 60-65%, mild LVH, moderate AS with mean gradient 29 mmHg/AVA 1.1 cm^2, PASP 45, normal RV size with mildly decreased systolic function.  - TEE (1/20): EF 55-60%, moderate LVH, AVA 0.5 cm^2 by planimetry with mean gradient 35 mmHg, AVA 1.0 cm^2 continuity equation.  - RHC (1/20): mean RA 15, PA 68/23 mean 44, mean PCWP 20, CI 3.8, PVR 3 WU 2. Aortic stenosis: Suspect severe at this point.  - TEE (1/20): EF 55-60%, moderate LVH, AVA 0.5 cm^2 by planimetry with mean gradient 35 mmHg, AVA 1.0 cm^2 continuity equation.   - TAVR (9/20) with Landfall 3 valve.   - Echo (10/20): EF 55-60%, mild RV dilation with normal systolic function, bioprosthetic aortic valve s/p TAVR with mean gradient 15 mmHg.  3. Type II diabetes 4. 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  - 3.8 cm ascending aorta on 8/19 echo.  5. OHS/OSA: Uses CPAP and oxygen during the day.  6. Morbid obesity.  7. HTN 8. CAD: Nonobstructive.  [place] 2005 with 60% LAD stenosis. 9. 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  10. Atrial fibrillation: Paroxysmal.  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  11. CKD stage III 12. Macular degeneration: Legally  blind.  13. 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  14. 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 15. S/p left foot amputation 16. Diabetic peripheral neuropathy.  17. H/o atrial tachycardia 18. CAD: LHC (8/20) with 50% mid RCA stenosis.   Social History   Socioeconomic History  . Marital status: Divorced    Spouse name: Not on file  . Number of children: 4  . Years of education: 46  . Highest education level: 12th grade  Occupational  History  . Occupation: disabled  Tobacco Use  . Smoking status: Former Smoker    Packs/day: 0.00    Years: 0.00    Pack years: 0.00    Types: Cigarettes    Quit date: 08/17/2003    Years since quitting: 16.1  . Smokeless tobacco: Never Used  . Tobacco comment: started at age 82.  only smoked on occ- very rarely.0  Substance and Sexual Activity  . Alcohol use: Yes    Comment: 06/23/2013 "drank some; never had a problem w/it"  . Drug use: No  . Sexual activity: Never  Other Topics Concern  . Not on file  Social History Narrative   12/10- reports issues with housing- states it is not disability friendly but has ramp, first floor apt, and has grab bars/assistive devices provided by [facility].  Reports that he has concerns with having enough food but gets meals on wheels, eats at [facility] 3x a week, and has personal aid who cooks and shops for him.  Reports concerns with transportation but gets [facility] transport for all of his medical appts and his family or an aid helps him with other transportation needs.   Social Determinants of Health   Financial Resource Strain:   . Difficulty of Paying Living Expenses: Not on file  Food Insecurity:   . Worried About Charity fundraiser in the Last Year: Not on file  . Ran Out of Food in the Last Year: Not on file  Transportation Needs:   . Lack of Transportation (Medical): Not on file  . Lack of Transportation (Non-Medical): Not on file  Physical Activity:   . Days of Exercise per Week: Not on file  . Minutes of Exercise per Session: Not on file  Stress:   . Feeling of Stress : Not on file  Social Connections:   . Frequency of Communication with Friends and Family: Not on file  . Frequency of Social Gatherings with Friends and Family: Not on file  . Attends Religious Services: Not on file  . Active Member of Clubs or Organizations: Not on file  . Attends Archivist Meetings: Not on file  . Marital Status: Not on file  Intimate Partner  Violence:   . Fear of Current or Ex-Partner: Not on file  . Emotionally Abused: Not on file  . Physically Abused: Not on file  . Sexually Abused: Not on file    Family History  Problem Relation Age of Onset  . Coronary artery disease Sister   . Heart disease Sister   . Allergies Sister   . Asthma Brother   . Rheum arthritis Brother    Review of systems complete and found to be negative unless listed in HPI.    Current Outpatient Medications  Medication Sig Dispense Refill  . acetaminophen (TYLENOL) 650 MG CR tablet Take 1,300 mg by mouth 2 (two) times daily.     Marland Kitchen amLODipine (NORVASC) 10 MG tablet Take 10 mg by mouth daily. Reported on 12/09/2015    .  amoxicillin (AMOXIL) 500 MG capsule Take 4 capsules by mouth one hour prior to dental work 4 capsule 5  . antiseptic oral rinse (BIOTENE) LIQD 15 mLs by Mouth Rinse route 3 (three) times daily as needed for dry mouth.     Marland Kitchen aspirin EC 81 MG tablet Take 1 tablet (81 mg total) by mouth daily. 90 tablet 3  . cholecalciferol (VITAMIN D) 1000 units tablet Take 1,000 Units by mouth daily.    . citalopram (CELEXA) 20 MG tablet Take 20 mg by mouth daily.    . clopidogrel (PLAVIX) 75 MG tablet Take 1 tablet (75 mg total) by mouth daily.    . famotidine (PEPCID) 20 MG tablet Take 20 mg by mouth daily.    . Febuxostat (ULORIC) 80 MG TABS Take 80 mg by mouth daily.     Marland Kitchen gabapentin (NEURONTIN) 300 MG capsule Take 300 mg by mouth 2 (two) times daily.    . hydrALAZINE (APRESOLINE) 50 MG tablet Take 75 mg by mouth 3 (three) times daily.    . insulin glargine (LANTUS) 100 UNIT/ML injection Inject 90 Units into the skin daily.    . Liraglutide (VICTOZA) 18 MG/3ML SOPN Inject 1.8 mg into the skin daily.    . Menthol, Topical Analgesic, (BIOFREEZE EX) Apply 1 application topically 2 (two) times daily as needed (knee/shoulder pain).     Marland Kitchen metolazone (ZAROXOLYN) 2.5 MG tablet Take 1.25 mg by mouth 3 (three) times a week. [id number]    . metoprolol succinate  (TOPROL-XL) 50 MG 24 hr tablet Take 1 tablet (50 mg total) by mouth daily. Take with or immediately following a meal. 30 tablet 3  . polyethylene glycol (MIRALAX / GLYCOLAX) packet Take 17 g by mouth daily as needed for moderate constipation.    . potassium chloride SA (KLOR-CON) 20 MEQ tablet Take 80 mEq by mouth 2 (two) times daily.    . rosuvastatin (CRESTOR) 20 MG tablet Take 20 mg by mouth daily.    . sennosides-docusate sodium (SENOKOT-S) 8.6-50 MG tablet Take 2 tablets by mouth 2 (two) times daily.     . Skin Protectants, Misc. (EUCERIN) cream Apply 1 application topically 2 (two) times daily.    Marland Kitchen torsemide (DEMADEX) 20 MG tablet Take 120 mg by mouth daily.     No current facility-administered medications for this encounter.   Facility-Administered Medications Ordered in Other Encounters  Medication Dose Route Frequency Provider Last Rate Last Admin  . chlorhexidine gluconate (MEDLINE KIT) (PERIDEX) 0.12 % solution 15 mL  15 mL Mouth Rinse BID Chesley Mires, MD      . MEDLINE mouth rinse  15 mL Mouth Rinse QID Chesley Mires, MD       Vitals:   10/15/19 1104  BP: (!) 152/76  Pulse: 70  SpO2: 98%  Weight: (!) 151.7 kg (334 lb 8 oz)    Wt Readings from Last 3 Encounters:  10/15/19 (!) 151.7 kg (334 lb 8 oz)  08/20/19 (!) 143.4 kg (316 lb 3.2 oz)  06/07/19 (!) 141.5 kg (312 lb)    Physical Exam General:  Super morbidly obese [name], blind in wheelchair. No respiratory difficulty HEENT: blind, otherwise normal Neck: supple, thick neck, JVD assessment difficult. Carotids 2+ bilat; no bruits. No lymphadenopathy or thyromegaly appreciated. Cor: PMI nondisplaced. Regular rate & rhythm. No rubs, gallops or murmurs. Lungs: clear Abdomen: obese, soft, nontender, nondistended. No hepatosplenomegaly. No bruits or masses. Good bowel sounds. Extremities: no cyanosis, clubbing, rash, 1+ bilateral LEE w/ chronic venous stasis  dermatitis  Neuro: alert & oriented x 3, cranial nerves grossly  intact. moves all 4 extremities w/o difficulty. Affect pleasant.   Assessment/Plan:  1. Chronic diastolic CHF with prominent RV failure: Volume has been difficult to manage in setting of RV failure (OHS/OSA) and CKD stage 3.  NYHA class Illb symptoms chronically.  He does not appear significantly volume overloaded on exam, although exam somewhat difficult due to obesity. Check BNP today.  - Continue torsemide 120 mg daily  - He is taking metolazone 1.25 mg three days a week, continue.  - Check BMET today to check SCr and electrolytes  - PYP scan completed 1/21. Visual and quantitative assessment (grade 1, H/CL equal 1.2) are Not suggestive of transthyretin amyloidosis. - Multiple Myeloma Panel however + for IgG monoclonal antibody. Hematology referral has been placed. Awaiting consultation.   2. Aortic stenosis:  S/p TAVR 9/20.  Valve stable on 10/20 echo with no significant AI and mean gradient 15 mmHg.   - Continue ASA + Plavix. Plan to d/c Plavix after 6 months (end of 3/21).  - Needs SBE prophylaxis prior to dental work  3. Atrial fibrillation: RRR on exam.  He is not anticoagulated (takes ASA) due to prior intracerebral hemorrhage. 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  Rate is controlled w/  blocker - As he is not anticoagulated, would continue rate control.  Cannot do DCCV without anticoagulation.  - Continue Toprol XL 50 mg daily.  4. CAD: Nonobstructive.  Denies CP - Continue ASA and statin. LP checked 1/21 and LDL at goal at 57 mg/dL 5. Bradycardia s/p Medtronic PPM.  -Followed by Dr. Sallyanne Kuster. Pacer check scheduled 3/8 6. CKD: Stage III.   - Check BMP today  7. OHS/OSA: reports full compliance w/ CPAP. Continue qhs. Continue home O2.  8. HTN: BP elevated. Increase hydralazine to 100 mg tid   F/u w/ Dr. Aundra Dubin in 3 months.    Lyda Jester, PA-C  10/15/2019

## 2019-10-15 NOTE — Addendum Note (Signed)
Encounter addended by: Consuelo Pandy, PA-C on: 10/15/2019 5:38 PM  Actions taken: Clinical Note Signed

## 2019-10-22 ENCOUNTER — Encounter: Payer: Self-pay | Admitting: Cardiovascular Disease

## 2019-10-22 ENCOUNTER — Ambulatory Visit (INDEPENDENT_AMBULATORY_CARE_PROVIDER_SITE_OTHER): Payer: Medicare (Managed Care) | Admitting: Cardiovascular Disease

## 2019-10-22 ENCOUNTER — Other Ambulatory Visit: Payer: Self-pay

## 2019-10-22 VITALS — BP 130/78 | HR 70 | Temp 96.6°F | Ht 72.0 in

## 2019-10-22 DIAGNOSIS — I5032 Chronic diastolic (congestive) heart failure: Secondary | ICD-10-CM

## 2019-10-22 DIAGNOSIS — N1832 Chronic kidney disease, stage 3b: Secondary | ICD-10-CM

## 2019-10-22 DIAGNOSIS — N183 Chronic kidney disease, stage 3 unspecified: Secondary | ICD-10-CM

## 2019-10-22 DIAGNOSIS — Z794 Long term (current) use of insulin: Secondary | ICD-10-CM

## 2019-10-22 DIAGNOSIS — I495 Sick sinus syndrome: Secondary | ICD-10-CM | POA: Diagnosis not present

## 2019-10-22 DIAGNOSIS — Z952 Presence of prosthetic heart valve: Secondary | ICD-10-CM

## 2019-10-22 DIAGNOSIS — I10 Essential (primary) hypertension: Secondary | ICD-10-CM

## 2019-10-22 DIAGNOSIS — I2510 Atherosclerotic heart disease of native coronary artery without angina pectoris: Secondary | ICD-10-CM

## 2019-10-22 DIAGNOSIS — R0989 Other specified symptoms and signs involving the circulatory and respiratory systems: Secondary | ICD-10-CM

## 2019-10-22 DIAGNOSIS — I480 Paroxysmal atrial fibrillation: Secondary | ICD-10-CM

## 2019-10-22 DIAGNOSIS — Z950 Presence of cardiac pacemaker: Secondary | ICD-10-CM | POA: Diagnosis not present

## 2019-10-22 DIAGNOSIS — I872 Venous insufficiency (chronic) (peripheral): Secondary | ICD-10-CM

## 2019-10-22 DIAGNOSIS — E0822 Diabetes mellitus due to underlying condition with diabetic chronic kidney disease: Secondary | ICD-10-CM

## 2019-10-22 DIAGNOSIS — E7800 Pure hypercholesterolemia, unspecified: Secondary | ICD-10-CM

## 2019-10-22 DIAGNOSIS — I471 Supraventricular tachycardia: Secondary | ICD-10-CM | POA: Diagnosis not present

## 2019-10-22 DIAGNOSIS — G4733 Obstructive sleep apnea (adult) (pediatric): Secondary | ICD-10-CM

## 2019-10-22 NOTE — Patient Instructions (Signed)
Medication Instructions:  No changes *If you need a refill on your cardiac medications before your next appointment, please call your pharmacy*   Lab Work: None ordered If you have labs (blood work) drawn today and your tests are completely normal, you will receive your results only by: Marland Kitchen MyChart Message (if you have MyChart) OR . A paper copy in the mail If you have any lab test that is abnormal or we need to change your treatment, we will call you to review the results.   Testing/Procedures: Your physician has requested that you have an ankle brachial index (ABI). During this test an ultrasound and blood pressure cuff are used to evaluate the arteries that supply the arms and legs with blood. Allow thirty minutes for this exam. There are no restrictions or special instructions. This will take place at [place], [address].   Your physician has requested that you have a lower extremity arterial duplex. During this test, ultrasound is used to evaluate arterial blood flow in the legs. Allow one hour for this exam. There are no restrictions or special instructions. This will take place at [place], [address].    Follow-Up: At [facility], you and your health needs are our priority.  As part of our continuing mission to provide you with exceptional heart care, we have created designated Provider Care Teams.  These Care Teams include your primary Cardiologist (physician) and Advanced Practice Providers (APPs -  Physician Assistants and Nurse Practitioners) who all work together to provide you with the care you need, when you need it.  We recommend signing up for the patient portal called "MyChart".  Sign up information is provided on this After Visit Summary.  MyChart is used to connect with patients for Virtual Visits (Telemedicine).  Patients are able to view lab/test results, encounter notes, upcoming appointments, etc.  Non-urgent messages can be sent to your provider as  well.   To learn more about what you can do with MyChart, go to [email or web address].    Your next appointment:   6 month(s)  The format for your next appointment:   In Person  Provider:   Sanda Klein, MD   Other Instructions Please wear compression stockings during the day and keep your legs elevated when possible.

## 2019-10-22 NOTE — Progress Notes (Signed)
Patient ID: [name], male   DOB: [birth year], 74 y.o.   MRN: [id number]    Cardiology Office Note    Date:  10/22/2019   ID:  [name], DOB [birth year], MRN [id number]  PCP:  [name], MD  Cardiologist:   Sanda Klein, MD   Chief Complaint  Patient presents with   Cardiac Valve Problem   Pacemaker Check   Congestive Heart Failure    History of Present Illness:  [name] is a 74 y.o. male with long-standing diastolic heart failure with RV dysfunction, hypertensive heart disease, nonobstructive coronary artery disease by remote coronary angiography, AS s/p TAVR Oletta Lamas SAPIEN 3, 29 mm Sept 2020, Dr. 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 It is important to note that he is blind and therefore requires assistance with transportation (uses [facility]) and has his medications prepacked in daily blister packs, prepared once weekly on Saturdays.  He is no longer in a skilled nursing facility and has returned home where he has home health aides 6 hours a day.  He has chronic NYHA functional class IIIb exertional dyspnea.  He remains remarkably resilient.  He did not have any major complications following TAVR last September, despite serious comorbid conditions at baseline.  He had uncomplicated XAJOI-78 infection in February.  He is very sedentary and it is hard to say whether he has noticed any real change in functional status following the TAVR procedure.  He has nonhealing wounds on his legs and persistent brawny edema.  Currently he has an ulcer on the anterior right shin, but edema is usually worse in the left leg.  He does not have orthopnea or PND.  He does not walk much due to severe neuropathy  and blindness.  He has not had any new focal neurological events. His weight is monitored at [facility] and that is consistently under 330 pounds.  We were unable to weigh him today.  He was seen in the heart failure clinic on 10/15/2019 and hydralazine was increased due to high BP. Excellent BP today. .  Pyrophosphate scan was not suggestive of transthyretin amyloidosis, but he did have a myeloma panel positive for lambda light chain specificity-IgG monoclonal spike, 1 g/deciliter.  He has been referred to hematology.  In November 2018 he was hospitalized with acute hypoxic respiratory failure with suspicion of amiodarone pulmonary toxicity.  He is not receiving anticoagulation due to history of intracranial hemorrhage in 2015.  His pacemaker was implanted for symptomatic sinus bradycardia in 2005, with a generator change in 2014. Despite his history of atrial fibrillation and stroke he is unable to receive anticoagulation due to a previous left parenchymal temporoparietal hematoma that required surgical evacuation. 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  Pacemaker (dial chamber Medtronic Adapta 2014) shows estimated generator longevity of 2.5 years.  Interrogation today shows that he has 28 % atrial pacing and 44 % ventricular pacing.  He had one episode of atrial flutter with a ventricular rate at about 100 bpm that occurred in December, lasted only 9 minutes.  As before, he has relatively frequent ventricular pacing during recurrent episodes of sustained atrial tachycardia with atrial blanking and failure to mode switch, since the tachycardia rate is relatively low  in the 120-130 range.  Overall atrial arrhythmia burden 11%. Ventricular pacing thresholds are chronically moderately high, today measured at 2.75 V at 1.0 ms.  Despite atrial tachycardia, high ventricular rates occur very infrequently. 2 brief episodes of NSVT seen (and "VT"  during TAVR deployment rapid pacing).   Past Medical History:  Diagnosis Date   Arthritis    "all over" (07/22/13)   Atrial fibrillation (HCC)    not Candidate for Coumadin d/t rectal bleeding   Benign prostatic hypertrophy    Cardiomyopathy, hypertensive ([place])     diastolic dysfunction by 8101 echo   Coronary artery disease     nonobstructive. 2 vessel. cardiac catheter in 2003-12-07 showing 60% LAD occlusion, 30% circumflex occlusion.   Degenerative joint disease     Right knee.   Diabetes type 2, uncontrolled ([place])    Diabetic peripheral neuropathy ([place])    Diabetic ulcer of thigh ([place])    H/O Rt thigh ulcer.   Exertional shortness of breath    "comes and goes" (22-Jul-2013)   GERD (gastroesophageal reflux disease)    Gout    Herpes    Hyperlipidemia    Hypertension    Hypotension 09/05/2013   Incarcerated ventral hernia    H/O. S/P repair ventral hernia repair 07/2005.   LBBB (left bundle branch block)    Legally blind    Morbid obesity ([place])    Obstructive sleep apnea    On CPAP. Last sleep study (06/2009) - Severe obstructive sleep apnea/hypopnea syndrome, apnea-hypopnea index 70.5 per hour with non positional events moderately loud snoring and oxygen desaturation to a nadir of 85% on room air.   Occult blood positive stool     History of in August 2007.  colonoscopy in January 2008 showing normal colon with fair inadequate prep. By Dr. Silvano Rusk.   Pacemaker 2005    secondary to bradycardia   Sepsis ([place]) 08/26/2013    Past Surgical History:  Procedure Laterality Date   AMPUTATION Left 06/22/2013   Procedure: AMPUTATION FOOT;  Surgeon: Newt Minion, MD;  Location: [facility];  Service: Orthopedics;  Laterality: Left;  Left Midfoot Amputation   CARDIAC CATHETERIZATION  10/22/2003   Dilatation of the LV with preserved LV systolic function. Tortous aorta, but no AAA. Moderate disease with 50-60% area of narrowing in the circumflex after the  second OM and 70% mid narrowing in the LAD. No intervention.   CARDIOVASCULAR STRESS TEST  01/23/2010   Perfusion defect seen in inferior myocardial region is consistent with diaphragmatic attenuation. Remaining myocardium demonstrates normal myocardial perfusion with no evidence of ischemia or infarct. Stress ECG was non-diagnostic due to LBBB.   CATARACT EXTRACTION, BILATERAL Bilateral    ESOPHAGOGASTRODUODENOSCOPY N/A 10/12/2013   Procedure: ESOPHAGOGASTRODUODENOSCOPY (EGD);  Surgeon: Gwenyth Ober, MD;  Location: [facility] ENDOSCOPY;  Service: General;  Laterality: N/A;  bedside   EYE SURGERY     FOOT AMPUTATION THROUGH METATARSAL Left 06/22/2013   midfoot/notes 06/22/2013   HERNIA REPAIR     INSERT / REPLACE / REMOVE PACEMAKER  12-07-2003   Secondary to symptomatic bradycardia. DDDR pacemaker. By Dr. Rollene Fare.   INSERT / REPLACE / REMOVE PACEMAKER  06-Dec-2012   "old pacemaker died; had new one put in" (2013/07/22)   Pacemaker lead revision  12/2003    likely microdislodgment  of right ventricular lead   PEG PLACEMENT N/A 10/12/2013   Procedure: PERCUTANEOUS ENDOSCOPIC GASTROSTOMY (PEG) PLACEMENT;  Surgeon: Gwenyth Ober, MD;  Location: [facility];  Service: General;  Laterality: N/A;   PERMANENT PACEMAKER GENERATOR CHANGE N/A 11/03/2012   Procedure: PERMANENT PACEMAKER GENERATOR CHANGE;  Surgeon: Sanda Klein, MD;  Location: [facility] CATH LAB;  Service: Cardiovascular;  Laterality: N/A;   REFRACTIVE SURGERY Bilateral    RIGHT HEART CATH N/A 09/05/2018   Procedure: RIGHT HEART CATH;  Surgeon: Larey Dresser, MD;  Location: [facility] CV LAB;  Service: Cardiovascular;  Laterality: N/A;   RIGHT/LEFT HEART CATH AND CORONARY ANGIOGRAPHY N/A 04/10/2019   Procedure: RIGHT/LEFT HEART CATH AND CORONARY ANGIOGRAPHY;  Surgeon: [name], MD;  Location: [facility] CV LAB;  Service: Cardiovascular;  Laterality: N/A;   TEE WITHOUT CARDIOVERSION N/A 09/05/2018   Procedure: TRANSESOPHAGEAL  ECHOCARDIOGRAM (TEE);  Surgeon: Larey Dresser, MD;  Location: [facility] ENDOSCOPY;  Service: Cardiovascular;  Laterality: N/A;   TEE WITHOUT CARDIOVERSION N/A 05/08/2019   Procedure: TRANSESOPHAGEAL ECHOCARDIOGRAM (TEE);  Surgeon: [name], MD;  Location: [facility] CV LAB;  Service: Open Heart Surgery;  Laterality: N/A;   TRANSCATHETER AORTIC VALVE REPLACEMENT, TRANSFEMORAL N/A 05/08/2019   Procedure: TRANSCATHETER AORTIC VALVE REPLACEMENT, TRANSFEMORAL;  Surgeon: [name], MD;  Location: [facility] CV LAB;  Service: Open Heart Surgery;  Laterality: N/A;   TRANSTHORACIC ECHOCARDIOGRAM  01/01/2013   EF 50-55%, ventricular septum-thickness moderately increased   VENOUS DOPPLER  01/17/2013   No evidence of thrombus or thrombophlebitis. Left GSVs appear enlarged and demonstrate valvular insuffiiciency. Bilateral SSVs appeared patent with no valvular insufficiency seen.   VENTRAL HERNIA REPAIR  07/2005    S/P laparotomy, lysis of adhesions, repair of incarcerated ventral hernia with  partial omentectomy.. Performed by Dr. Marlou Starks.    Outpatient Medications Prior to Visit  Medication Sig Dispense Refill   acetaminophen (TYLENOL) 650 MG CR tablet Take 1,300 mg by mouth 2 (two) times daily.      amLODipine (NORVASC) 10 MG tablet Take 10 mg by mouth daily. Reported on 12/09/2015     amoxicillin (AMOXIL) 500 MG capsule Take 4 capsules by mouth one hour prior to dental work 4 capsule 5   antiseptic oral rinse (BIOTENE) LIQD 15 mLs by Mouth Rinse route 3 (three) times daily as needed for dry mouth.      aspirin EC 81 MG tablet Take 1 tablet (81 mg total) by mouth daily. 90 tablet 3   cholecalciferol (VITAMIN D) 1000 units tablet Take 1,000 Units by mouth daily.     citalopram (CELEXA) 20 MG tablet Take 20 mg by mouth daily.     clopidogrel (PLAVIX) 75 MG tablet Take 1 tablet (75 mg total) by mouth daily.     famotidine (PEPCID) 20 MG tablet Take 20 mg by mouth daily.     Febuxostat  (ULORIC) 80 MG TABS Take 80 mg by mouth daily.      gabapentin (NEURONTIN) 300 MG capsule Take 300 mg by mouth 2 (two) times daily.     hydrALAZINE (APRESOLINE) 100 MG tablet Take 1 tablet (100 mg total) by mouth 3 (three) times daily. 90 tablet 6   insulin glargine (LANTUS) 100 UNIT/ML injection Inject 90 Units into the skin daily.     Liraglutide (VICTOZA) 18 MG/3ML SOPN Inject 1.8 mg into the skin daily.     Menthol, Topical Analgesic, (BIOFREEZE EX) Apply 1 application topically 2 (two) times daily as needed (knee/shoulder pain).      metolazone (ZAROXOLYN) 2.5 MG tablet Take 1.25 mg by mouth 3 (three) times a week. [id number]     metoprolol succinate (TOPROL-XL) 50 MG 24 hr tablet Take  1 tablet (50 mg total) by mouth daily. Take with or immediately following a meal. 30 tablet 3   polyethylene glycol (MIRALAX / GLYCOLAX) packet Take 17 g by mouth daily as needed for moderate constipation.     potassium chloride SA (KLOR-CON) 20 MEQ tablet Take 80 mEq by mouth 2 (two) times daily.     rosuvastatin (CRESTOR) 20 MG tablet Take 20 mg by mouth daily.     sennosides-docusate sodium (SENOKOT-S) 8.6-50 MG tablet Take 2 tablets by mouth 2 (two) times daily.      Skin Protectants, Misc. (EUCERIN) cream Apply 1 application topically 2 (two) times daily.     torsemide (DEMADEX) 20 MG tablet Take 120 mg by mouth daily.     Facility-Administered Medications Prior to Visit  Medication Dose Route Frequency Provider Last Rate Last Admin   chlorhexidine gluconate (MEDLINE KIT) (PERIDEX) 0.12 % solution 15 mL  15 mL Mouth Rinse BID Chesley Mires, MD       MEDLINE mouth rinse  15 mL Mouth Rinse QID Chesley Mires, MD         Allergies:   Patient has no known allergies.   Social History   Socioeconomic History   Marital status: Divorced    Spouse name: Not on file   Number of children: 4   Years of education: 12   Highest education level: 12th grade  Occupational History   Occupation:  disabled  Tobacco Use   Smoking status: Former Smoker    Packs/day: 0.00    Years: 0.00    Pack years: 0.00    Types: Cigarettes    Quit date: 08/17/2003    Years since quitting: 16.1   Smokeless tobacco: Never Used   Tobacco comment: started at age 8.  only smoked on occ- very rarely.0  Substance and Sexual Activity   Alcohol use: Yes    Comment: 06/23/2013 "drank some; never had a problem w/it"   Drug use: No   Sexual activity: Never  Other Topics Concern   Not on file  Social History Narrative   12/10- reports issues with housing- states it is not disability friendly but has ramp, first floor apt, and has grab bars/assistive devices provided by [facility].  Reports that he has concerns with having enough food but gets meals on wheels, eats at [facility] 3x a week, and has personal aid who cooks and shops for him.  Reports concerns with transportation but gets [facility] transport for all of his medical appts and his family or an aid helps him with other transportation needs.   Social Determinants of Health   Financial Resource Strain:    Difficulty of Paying Living Expenses: Not on file  Food Insecurity:    Worried About Charity fundraiser in the Last Year: Not on file   [employer] of Food in the Last Year: Not on file  Transportation Needs:    Lack of Transportation (Medical): Not on file   Lack of Transportation (Non-Medical): Not on file  Physical Activity:    Days of Exercise per Week: Not on file   Minutes of Exercise per Session: Not on file  Stress:    Feeling of Stress : Not on file  Social Connections:    Frequency of Communication with Friends and Family: Not on file   Frequency of Social Gatherings with Friends and Family: Not on file   Attends Religious Services: Not on file   Active Member of Clubs or Organizations: Not on file  Attends Archivist Meetings: Not on file   Marital Status: Not on file     Family History:  The patient's family  history includes Allergies in his sister; Asthma in his brother; Coronary artery disease in his sister; Heart disease in his sister; Rheum arthritis in his brother.   ROS:   Please see the history of present illness.    ROS All other systems reviewed and are negative.   PHYSICAL EXAM:   VS:  BP 130/78    Pulse 70    Temp (!) 96.6 F (35.9 C)    Ht 6' (1.829 m)    SpO2 99%    BMI 45.37 kg/m    General: Alert, oriented x3, no distress, morbid obesity, pacemaker site appears healthy Head: no evidence of trauma, PERRL, EOMI, no exophtalmos or lid lag, no myxedema, no xanthelasma; normal ears, nose and oropharynx Neck: hard to see the jugular venous pulsations ; brisk carotid pulses without delay and no carotid bruits Chest: clear to auscultation, no signs of consolidation by percussion or palpation, normal fremitus, symmetrical and full respiratory excursions Cardiovascular: normal position and quality of the apical impulse, regular rhythm, normal first and second heart sounds, short aortic ejection murmur, no diastolic murmurs, rubs or gallops Abdomen: no tenderness or distention, no masses by palpation, no abnormal pulsatility or arterial bruits, normal bowel sounds, no hepatosplenomegaly Extremities: no clubbing, cyanosis; dressing left mid-shin, bilateral severe brawny edema changes; 2+ radial, ulnar and brachial pulses bilaterally; unable to palpate pedal pulses due to edema Neurological: grossly nonfocal except blindness Psych: Normal mood and affect   Wt Readings from Last 3 Encounters:  10/15/19 (!) 334 lb 8 oz (151.7 kg)  08/20/19 (!) 316 lb 3.2 oz (143.4 kg)  06/07/19 (!) 312 lb (141.5 kg)    We were unable to weigh him today.  Studies/Labs Reviewed:   EKG:  EKG is not ordered today.   Recent Labs: 04/28/2019: TSH 2.939 05/07/2019: ALT 23 05/09/2019: Magnesium 2.0 08/20/2019: Hemoglobin 10.6; Platelets 212 10/15/2019: B Natriuretic Peptide 117.9; BUN 76; Creatinine, Ser 2.39;  Potassium 4.2; Sodium 134   Lipid Panel    Component Value Date/Time   CHOL 111 08/20/2019 1122   CHOL 129 01/05/2017 1235   TRIG 109 08/20/2019 1122   HDL 32 (L) 08/20/2019 1122   HDL 36 (L) 01/05/2017 1235   CHOLHDL 3.5 08/20/2019 1122   VLDL 22 08/20/2019 1122   LDLCALC 57 08/20/2019 1122   LDLCALC 71 01/05/2017 1235    Additional studies/ records that were reviewed today include:  Heart Failure clinic notes, Structural heart clinic notes  ECHO 06/07/2019 1. Left ventricular ejection fraction, by visual estimation, is 55 to  60%. The left ventricle has normal function. Normal left ventricular size.  There is no left ventricular hypertrophy.  2. Global right ventricle has normal systolic function.The right  ventricular size is mildly enlarged. No increase in right ventricular wall  thickness.  3. Left atrial size was mildly dilated.  4. Right atrial size was normal.  5. The mitral valve is normal in structure. No evidence of mitral valve  regurgitation. No evidence of mitral stenosis.  6. The tricuspid valve is normal in structure. Tricuspid valve  regurgitation was not visualized by color flow Doppler.  7. Aortic valve mean gradient measures 11.6 mmHg.  8. Aortic valve peak gradient measures 18.1 mmHg.  9. Aortic valve regurgitation was not visualized by color flow Doppler.  Structurally normal aortic valve, with no evidence of  sclerosis or  stenosis.  10. Edwards Sapien 3 Ultra THV (size 29 mm, model # 9750TFX, serial #  [id number])   Peak transaortic velocity 2.18ms, mean gradient 162mg.  11. The pulmonic valve was normal in structure. Pulmonic valve  regurgitation is not visualized by color flow Doppler.  12. A pacer wire is visualized.  13. The inferior vena cava is normal in size with greater than 50%  respiratory variability, suggesting right atrial pressure of 3 mmHg.  ASSESSMENT:    1. Chronic diastolic heart failure ([facility]  2. SSS (sick sinus  syndrome) (HCE[place]  3. PAT (paroxysmal atrial tachycardia) (HCP[place]  4. Pacemaker   5. PAF (paroxysmal atrial fibrillation) (HCL[place]  6. Coronary artery disease involving native coronary artery of native heart without angina pectoris   7. Essential hypertension   8. OSA (obstructive sleep apnea)   9. Stage 3b chronic kidney disease   10. Morbid obesity ([facility]CD[facility] [place]  11. Hypercholesterolemia   12. Diabetes mellitus due to underlying condition, controlled, with stage 3 chronic kidney disease, with long-term current use of insulin (HCSun[place]  13. S/P TAVR (transcatheter aortic valve replacement)   14. Diminished pulses in lower extremity   15. Peripheral venous insufficiency      PLAN:  In order of problems listed above:   1. CHF: Physical exam is difficult due to morbid obesity.  As far as I can tell he is euvolemic, although at his recent HF clinic eval his weight was above our previous estimated "dry weight" of 320-330 pounds. Continue same diuretic regimen.  Avoid nephrotoxic agents.  Unable to prescribe RAAS inhibitors.  Expect will continue to have issues maintaining a balance between renal dysfunction and heart failure.   2. SSS: Atrial pacing percentage has decreased drastically due to the very frequent episodes of atrial flutter/atrial tachycardia. 3. PPM: There are numerous episodes of atrial tachycardia (less commonly true atrial flutter), overall burden 11%, with failure to mode switch leading to increased frequency of ventricular pacing.  Programming was changed to DDI.  We have tried to set him up for her pacemaker downloads unsuccessfully.  We will see him in the clinic every 6 months. 4. PAF: Only 9 minutes of atrial flutter in the last 6 months, no true atrial fibrillation.  Despite high embolic risk not be anticoagulated due to history of intracranial hemorrhage.   5. PAT: Seen by EP and felt to be a poor candidate for ablation.  Amiodarone was stopped due to suspicion of lung toxicity.   Dofetilide and sotalol may be dangerous with his abnormal and volatile kidney function and any antiarrhythmic will be associated with increased atrial and ventricular pacing.  We will have to proceed with conservative management with beta-blockers only. 6. CAD: Very sedentary, no angina.  Had nonobstructive lesions by cath 03/2019. 7. HTN: Control is better after the recent increase in hydralazine.  No changes made to his medications 8. OSA: 100% compliance with CPAP strongly recommended 9. CKD3b-4: [facility] SaJoelyn Oms Over the last 9 months his renal function has been more stable; creatinine approximately 2.4, GFR around 30. 10. Obesity: A major contributor to his heart failure and pulmonary hypertension. 11. HLP: On statin.  Most recent LDL excellent at 57. 12. DM: Imperfect control.  Hemoglobin A1c 8% last September. 13. AS s/p TAVR: did remarkably well with the procedure. Good result on follow up echo Oct 2020. SBE prophylaxis has been reviewed. 14. PAD: high likelihood due  to DM. Unable to feel pedal pulses. Check ABI, full duplex US if abnormal. 15. Venous insuff: previous scans without DVT, has severe superficial venous insufficiency, worsened by obesity    Medication Adjustments/Labs and Tests Ordered: Current medicines are reviewed at length with the patient today.  Concerns regarding medicines are outlined above.  Medication changes, Labs and Tests ordered today are listed in the Patient Instructions below. Patient Instructions  Medication Instructions:  No changes *If you need a refill on your cardiac medications before your next appointment, please call your pharmacy*   Lab Work: None ordered If you have labs (blood work) drawn today and your tests are completely normal, you will receive your results only by:  [facility] (if you have MyChart) OR  A paper copy in the mail If you have any lab test that is abnormal or we need to change your  treatment, we will call you to review the results.   Testing/Procedures: Your physician has requested that you have an ankle brachial index (ABI). During this test an ultrasound and blood pressure cuff are used to evaluate the arteries that supply the arms and legs with blood. Allow thirty minutes for this exam. There are no restrictions or special instructions. This will take place at [place], [address].   Your physician has requested that you have a lower extremity arterial duplex. During this test, ultrasound is used to evaluate arterial blood flow in the legs. Allow one hour for this exam. There are no restrictions or special instructions. This will take place at [place], [address].    Follow-Up: At [facility], you and your health needs are our priority.  As part of our continuing mission to provide you with exceptional heart care, we have created designated Provider Care Teams.  These Care Teams include your primary Cardiologist (physician) and Advanced Practice Providers (APPs -  Physician Assistants and Nurse Practitioners) who all work together to provide you with the care you need, when you need it.  We recommend signing up for the patient portal called "MyChart".  Sign up information is provided on this After Visit Summary.  MyChart is used to connect with patients for Virtual Visits (Telemedicine).  Patients are able to view lab/test results, encounter notes, upcoming appointments, etc.  Non-urgent messages can be sent to your provider as well.   To learn more about what you can do with MyChart, go to [email or web address].    Your next appointment:   6 month(s)  The format for your next appointment:   In Person  Provider:   Sanda Klein, MD   Other Instructions Please wear compression stockings during the day and keep your legs elevated when possible.           Signed, Sanda Klein, MD  10/22/2019 5:40 PM    [facility]  [facility] [place], [place], [place]  [address] Phone: [phone]; Fax: [phone]

## 2019-10-23 ENCOUNTER — Telehealth: Payer: Self-pay | Admitting: Hematology

## 2019-10-23 NOTE — Telephone Encounter (Signed)
Received a new hem referral from [name] for monoclonal antibody treatment for malignancy. [name] has been cld and scheduled to see Dr. Irene Limbo on 3/22 at 10am. Pt has transportation through [facility]. Will call them to set the pt up.

## 2019-11-05 ENCOUNTER — Other Ambulatory Visit: Payer: Self-pay

## 2019-11-05 ENCOUNTER — Inpatient Hospital Stay: Payer: Medicare (Managed Care)

## 2019-11-05 ENCOUNTER — Inpatient Hospital Stay: Payer: Medicare (Managed Care) | Attending: Hematology | Admitting: Hematology

## 2019-11-05 VITALS — BP 156/74 | HR 71 | Temp 98.2°F | Resp 16 | Ht 72.0 in | Wt 331.0 lb

## 2019-11-05 DIAGNOSIS — Z9181 History of falling: Secondary | ICD-10-CM | POA: Insufficient documentation

## 2019-11-05 DIAGNOSIS — Z79899 Other long term (current) drug therapy: Secondary | ICD-10-CM | POA: Insufficient documentation

## 2019-11-05 DIAGNOSIS — E785 Hyperlipidemia, unspecified: Secondary | ICD-10-CM | POA: Insufficient documentation

## 2019-11-05 DIAGNOSIS — H548 Legal blindness, as defined in USA: Secondary | ICD-10-CM | POA: Diagnosis not present

## 2019-11-05 DIAGNOSIS — Z8261 Family history of arthritis: Secondary | ICD-10-CM | POA: Diagnosis not present

## 2019-11-05 DIAGNOSIS — I252 Old myocardial infarction: Secondary | ICD-10-CM | POA: Insufficient documentation

## 2019-11-05 DIAGNOSIS — Z6841 Body Mass Index (BMI) 40.0 and over, adult: Secondary | ICD-10-CM | POA: Diagnosis not present

## 2019-11-05 DIAGNOSIS — I480 Paroxysmal atrial fibrillation: Secondary | ICD-10-CM | POA: Insufficient documentation

## 2019-11-05 DIAGNOSIS — I2510 Atherosclerotic heart disease of native coronary artery without angina pectoris: Secondary | ICD-10-CM

## 2019-11-05 DIAGNOSIS — M25551 Pain in right hip: Secondary | ICD-10-CM | POA: Insufficient documentation

## 2019-11-05 DIAGNOSIS — I129 Hypertensive chronic kidney disease with stage 1 through stage 4 chronic kidney disease, or unspecified chronic kidney disease: Secondary | ICD-10-CM

## 2019-11-05 DIAGNOSIS — F17210 Nicotine dependence, cigarettes, uncomplicated: Secondary | ICD-10-CM | POA: Diagnosis not present

## 2019-11-05 DIAGNOSIS — I43 Cardiomyopathy in diseases classified elsewhere: Secondary | ICD-10-CM | POA: Diagnosis not present

## 2019-11-05 DIAGNOSIS — Z836 Family history of other diseases of the respiratory system: Secondary | ICD-10-CM | POA: Diagnosis not present

## 2019-11-05 DIAGNOSIS — D472 Monoclonal gammopathy: Secondary | ICD-10-CM

## 2019-11-05 DIAGNOSIS — N1832 Chronic kidney disease, stage 3b: Secondary | ICD-10-CM | POA: Diagnosis not present

## 2019-11-05 DIAGNOSIS — Z9981 Dependence on supplemental oxygen: Secondary | ICD-10-CM | POA: Insufficient documentation

## 2019-11-05 DIAGNOSIS — I119 Hypertensive heart disease without heart failure: Secondary | ICD-10-CM | POA: Diagnosis not present

## 2019-11-05 DIAGNOSIS — G4730 Sleep apnea, unspecified: Secondary | ICD-10-CM | POA: Diagnosis not present

## 2019-11-05 DIAGNOSIS — Z8673 Personal history of transient ischemic attack (TIA), and cerebral infarction without residual deficits: Secondary | ICD-10-CM | POA: Diagnosis not present

## 2019-11-05 DIAGNOSIS — E1122 Type 2 diabetes mellitus with diabetic chronic kidney disease: Secondary | ICD-10-CM | POA: Diagnosis not present

## 2019-11-05 DIAGNOSIS — Z8249 Family history of ischemic heart disease and other diseases of the circulatory system: Secondary | ICD-10-CM | POA: Insufficient documentation

## 2019-11-05 DIAGNOSIS — I998 Other disorder of circulatory system: Secondary | ICD-10-CM | POA: Insufficient documentation

## 2019-11-05 DIAGNOSIS — R0602 Shortness of breath: Secondary | ICD-10-CM | POA: Diagnosis not present

## 2019-11-05 LAB — CBC WITH DIFFERENTIAL/PLATELET
Abs Immature Granulocytes: 0.05 10*3/uL (ref 0.00–0.07)
Basophils Absolute: 0.1 10*3/uL (ref 0.0–0.1)
Basophils Relative: 1 %
Eosinophils Absolute: 0.1 10*3/uL (ref 0.0–0.5)
Eosinophils Relative: 1 %
HCT: 37.3 % — ABNORMAL LOW (ref 39.0–52.0)
Hemoglobin: 11.1 g/dL — ABNORMAL LOW (ref 13.0–17.0)
Immature Granulocytes: 1 %
Lymphocytes Relative: 16 %
Lymphs Abs: 1.6 10*3/uL (ref 0.7–4.0)
MCH: 23.6 pg — ABNORMAL LOW (ref 26.0–34.0)
MCHC: 29.8 g/dL — ABNORMAL LOW (ref 30.0–36.0)
MCV: 79.2 fL — ABNORMAL LOW (ref 80.0–100.0)
Monocytes Absolute: 1.1 10*3/uL — ABNORMAL HIGH (ref 0.1–1.0)
Monocytes Relative: 11 %
Neutro Abs: 7.3 10*3/uL (ref 1.7–7.7)
Neutrophils Relative %: 70 %
Platelets: 173 10*3/uL (ref 150–400)
RBC: 4.71 MIL/uL (ref 4.22–5.81)
RDW: 18.5 % — ABNORMAL HIGH (ref 11.5–15.5)
WBC: 10.2 10*3/uL (ref 4.0–10.5)
nRBC: 0 % (ref 0.0–0.2)

## 2019-11-05 LAB — CMP (CANCER CENTER ONLY)
ALT: 19 U/L (ref 0–44)
AST: 14 U/L — ABNORMAL LOW (ref 15–41)
Albumin: 3.2 g/dL — ABNORMAL LOW (ref 3.5–5.0)
Alkaline Phosphatase: 82 U/L (ref 38–126)
Anion gap: 13 (ref 5–15)
BUN: 65 mg/dL — ABNORMAL HIGH (ref 8–23)
CO2: 28 mmol/L (ref 22–32)
Calcium: 9.1 mg/dL (ref 8.9–10.3)
Chloride: 101 mmol/L (ref 98–111)
Creatinine: 2.24 mg/dL — ABNORMAL HIGH (ref 0.61–1.24)
GFR, Est AFR Am: 33 mL/min — ABNORMAL LOW (ref >60)
GFR, Estimated: 28 mL/min — ABNORMAL LOW (ref >60)
Glucose, Bld: 161 mg/dL — ABNORMAL HIGH (ref 70–99)
Potassium: 3.7 mmol/L (ref 3.5–5.1)
Sodium: 142 mmol/L (ref 135–145)
Total Bilirubin: 0.4 mg/dL (ref 0.3–1.2)
Total Protein: 8 g/dL (ref 6.5–8.1)

## 2019-11-05 LAB — SEDIMENTATION RATE: Sed Rate: 60 mm/hr — ABNORMAL HIGH (ref 0–16)

## 2019-11-05 LAB — LACTATE DEHYDROGENASE: LDH: 275 U/L — ABNORMAL HIGH (ref 98–192)

## 2019-11-05 NOTE — Progress Notes (Signed)
HEMATOLOGY/ONCOLOGY CONSULTATION NOTE  Date of Service: 11/05/2019  Patient Care Team: [name], MD as PCP - General (Family Medicine) Ara Kussmaul, MD as Consulting Physician (Ophthalmology) [facility], Michae Kava, MD (Cardiology)  CHIEF COMPLAINTS/PURPOSE OF CONSULTATION:  Monoclonal paraproteinemia  HISTORY OF PRESENTING ILLNESS:   [name] is a wonderful 74 y.o. male who has been referred to [place] by Dr Bradd Burner for evaluation and management of Monoclonal antibody IgG elevation. The pt reports that he is doing well overall.   The pt reports that he feels he has been doing well recently but that his breathing has been "up and down". He was placed on oxgen about 6 months ago. Pt previously smoked about 1 pack of cigarettes every 6 months. He also drank alcohol but this was may years ago. Pt has been using his CPAP machine at night but not when he sleeps during the day. Pt does not seem sure that his diabetes has been well-controlled. He has had CKD for awhile and was told that it was caused by his HTN and Diabetes. Pt has never required dialysis. He lost his eyesight around seven years ago. Pt has had a heart attack and has required a Cardiac Catheterization. He also had a stroke about 3-4 years ago.   Pt currently lives alone but has an aid that comes in 2 hours in the morning and 3 hours in the evening. He notes that his aid advised him this morning that his urine looked abnormal. He has been having pain in his right hip and leg for the last year. Pt has spoken to Dr Bradd Burner about this discomfort and he believes that this is due to a previous fall.   Of note prior to the patient's visit today, pt has had NM CARDIAC AMYLOID TUMOR ([id number]) completed on 08/31/2019 with results revealing "Visual and quantitative assessment (grade 1, H/CL equal 1.2) are Not suggestive of transthyretin amyloidosis."   Most recent lab results (08/20/2019) of CBC and BMP is as follows:  all values are WNL except for Hgb at 10.6, HCT at 35.6, MCH at 24.0, MCHC at 29.8, RDW at 16.1, Glucose at 170, BUN at 72, Creatinine at 2.51, GFR Est Af Am at 29. 08/20/2019 MMP is as follows: all values are WNL except for IgG at 2176, IgM at 147, M Protein at 1.0, Total Globulin at 4.2.   On review of systems, pt reports pain in right hip/leg, SOB, abnormal looking urine and denies any other symptoms.   On PMHx the pt reports Atrial Fibrillation, CKD, Hypertensive Cardiomyopathy, CAD, Type II Diabetes, HLD, HTN, Pacemaker, Sleep apnea, Legally blind, Cardiac Catheterization, Cardiac Angiography, TEE. On Social Hx the pt reports previous history of smoking (1 pack in 6 months) and alcohol consumption. He stopped years ago.   MEDICAL HISTORY:  Past Medical History:  Diagnosis Date  . Arthritis    "all over" (06/23/2013)  . Atrial fibrillation ([place])    not Candidate for Coumadin d/t rectal bleeding  . Benign prostatic hypertrophy   . Cardiomyopathy, hypertensive ([place])     diastolic dysfunction by 6160 echo  . Coronary artery disease     nonobstructive. 2 vessel. cardiac catheter in March 2005 showing 60% LAD occlusion, 30% circumflex occlusion.  . Degenerative joint disease     Right knee.  . Diabetes type 2, uncontrolled ([place])   . Diabetic peripheral neuropathy ([place])   . Diabetic ulcer of thigh ([place])    H/O Rt thigh ulcer.  . Exertional shortness  of breath    "comes and goes" (06/23/2013)  . GERD (gastroesophageal reflux disease)   . Gout   . Herpes   . Hyperlipidemia   . Hypertension   . Hypotension 09/05/2013  . Incarcerated ventral hernia    H/O. S/P repair ventral hernia repair 07/2005.  . LBBB (left bundle branch block)   . Legally blind   . Morbid obesity ([facility])   . Obstructive sleep apnea    On CPAP. Last sleep study (06/2009) - Severe obstructive sleep apnea/hypopnea syndrome, apnea-hypopnea index 70.5 per hour with non positional events moderately loud snoring and oxygen  desaturation to a nadir of 85% on room air.  . Occult blood positive stool     History of in August 2007.  colonoscopy in January 2008 showing normal colon with fair inadequate prep. By Dr. Carl Gessner.  . 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 Sepsis ([facility]) 08/26/2013    SURGICAL HISTORY: Past Surgical History:  Procedure Laterality Date  . AMPUTATION Left 06/22/2013   Procedure: AMPUTATION FOOT;  Surgeon: Marcus V Duda, MD;  Location: [facility] OR;  Service: Orthopedics;  Laterality: Left;  Left Midfoot Amputation  . CARDIAC CATHETERIZATION  10/22/2003   Dilatation of the LV with preserved LV systolic function. Tortous aorta, but no AAA. Moderate disease with 50-60% area of narrowing in the circumflex after the second OM and 70% mid narrowing in the LAD. No intervention.  . CARDIOVASCULAR STRESS TEST  01/23/2010   Perfusion defect seen in inferior myocardial region is consistent with diaphragmatic attenuation. Remaining myocardium demonstrates normal myocardial perfusion with no evidence of ischemia or infarct. Stress ECG was non-diagnostic due to LBBB.  . CATARACT EXTRACTION, BILATERAL Bilateral   . ESOPHAGOGASTRODUODENOSCOPY N/A 10/12/2013   Procedure: ESOPHAGOGASTRODUODENOSCOPY (EGD);  Surgeon: James O Wyatt, MD;  Location: [facility];  Service: General;  Laterality: N/A;  bedside  . EYE SURGERY    . 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 HERNIA REPAIR    . INSERT / REPLACE / REMOVE PACEMAKER  10/2003   Secondary to symptomatic bradycardia. DDDR pacemaker. By Dr. Weintraub.  . INSERT / REPLACE / REMOVE PACEMAKER  10/2012   "old pacemaker died; had new one put in" (06/23/2013)  . Pacemaker lead revision  12/2003    likely microdislodgment  of right ventricular lead  . PEG PLACEMENT N/A 10/12/2013   Procedure: PERCUTANEOUS ENDOSCOPIC GASTROSTOMY (PEG) PLACEMENT;  Surgeon: James O Wyatt, MD;  Location: [facility];  Service: General;  Laterality:  N/A;  . PERMANENT PACEMAKER GENERATOR CHANGE N/A 11/03/2012   Procedure: PERMANENT PACEMAKER GENERATOR CHANGE;  Surgeon: Mihai Croitoru, MD;  Location: [facility] CATH LAB;  Service: Cardiovascular;  Laterality: N/A;  . REFRACTIVE SURGERY Bilateral   . RIGHT HEART CATH N/A 09/05/2018   Procedure: RIGHT HEART CATH;  Surgeon: McLean, Dalton S, MD;  Location: [facility] INVASIVE CV [facility];  Service: Cardiovascular;  Laterality: N/A;  . RIGHT/LEFT HEART CATH AND CORONARY ANGIOGRAPHY N/A 04/10/2019   Procedure: RIGHT/LEFT HEART CATH AND CORONARY ANGIOGRAPHY;  Surgeon: Cooper, Michael, MD;  [facility];  Service: Cardiovascular;  Laterality: N/A;  . TEE WITHOUT CARDIOVERSION N/A 09/05/2018   Procedure: TRANSESOPHAGEAL ECHOCARDIOGRAM (TEE);  Surgeon: McLean, Dalton S, MD;  Location: [facility];  Service: Cardiovascular;  Laterality: N/A;  . TEE WITHOUT CARDIOVERSION N/A 05/08/2019   Procedure: TRANSESOPHAGEAL ECHOCARDIOGRAM (TEE);  Surgeon: Cooper, Michael, MD;  [facility];  Service: Open Heart Surgery;    Laterality: N/A;  . TRANSCATHETER AORTIC VALVE REPLACEMENT, TRANSFEMORAL N/A 05/08/2019   Procedure: TRANSCATHETER AORTIC VALVE REPLACEMENT, TRANSFEMORAL;  Surgeon: Cooper, Michael, MD;  [facility];  Service: Open Heart Surgery;  Laterality: N/A;  . TRANSTHORACIC ECHOCARDIOGRAM  01/01/2013   EF 50-55%, ventricular septum-thickness moderately increased  . VENOUS DOPPLER  01/17/2013   No evidence of thrombus or thrombophlebitis. Left GSVs appear enlarged and demonstrate valvular insuffiiciency. Bilateral SSVs appeared patent with no valvular insufficiency seen.  . VENTRAL HERNIA REPAIR  07/2005    S/P laparotomy, lysis of adhesions, repair of incarcerated ventral hernia with  partial omentectomy.. Performed by Dr. Toth.    SOCIAL HISTORY: Social History   Socioeconomic History  . Marital status: Divorced    Spouse name: Not on file  . Number of children: 4  . Years of  education: 12  . Highest education level: 12th grade  Occupational History  . Occupation: disabled  Tobacco Use  . Smoking status: Former Smoker    Packs/day: 0.00    Years: 0.00    Pack years: 0.00    Types: Cigarettes    Quit date: 08/17/2003    Years since quitting: 16.2  . Smokeless tobacco: Never Used  . Tobacco comment: started at age 25.  only smoked on occ- very rarely.0  Substance and Sexual Activity  . Alcohol use: Yes    Comment: 06/23/2013 "drank some; never had a problem w/it"  . Drug use: No  . Sexual activity: Never  Other Topics Concern  . Not on file  Social History Narrative   12/10- reports issues with housing- states it is not disability friendly but has ramp, first floor apt, and has grab bars/assistive devices provided by [employer].  Reports that he has concerns with having enough food but gets meals on wheels, eats at [employer] 3x a week, and has personal aid who cooks and shops for him.  Reports concerns with transportation but gets [employer] transport for all of his medical appts and his family or an aid helps him with other transportation needs.   Social Determinants of Health   Financial Resource Strain:   . Difficulty of Paying Living Expenses:   Food Insecurity:   . Worried About Running Out of Food in the Last Year:   . Ran Out of Food in the Last Year:   Transportation Needs:   . Lack of Transportation (Medical):   . Lack of Transportation (Non-Medical):   Physical Activity:   . Days of Exercise per Week:   . Minutes of Exercise per Session:   Stress:   . Feeling of Stress :   Social Connections:   . Frequency of Communication with Friends and Family:   . Frequency of Social Gatherings with Friends and Family:   . Attends Religious Services:   . Active Member of Clubs or Organizations:   . Attends Club or Organization Meetings:   . Marital Status:   Intimate Partner Violence:   . Fear of Current or Ex-Partner:   . Emotionally Abused:   . Physically  Abused:   . Sexually Abused:     FAMILY HISTORY: Family History  Problem Relation Age of Onset  . Coronary artery disease Sister   . Heart disease Sister   . Allergies Sister   . Asthma Brother   . Rheum arthritis Brother     ALLERGIES:  has No Known Allergies.  MEDICATIONS:  Current Outpatient Medications  Medication Sig Dispense Refill  . acetaminophen (TYLENOL)   650 MG CR tablet Take 1,300 mg by mouth 2 (two) times daily.     Marland Kitchen amLODipine (NORVASC) 10 MG tablet Take 10 mg by mouth daily. Reported on 12/09/2015    . amoxicillin (AMOXIL) 500 MG capsule Take 4 capsules by mouth one hour prior to dental work 4 capsule 5  . antiseptic oral rinse (BIOTENE) LIQD 15 mLs by Mouth Rinse route 3 (three) times daily as needed for dry mouth.     Marland Kitchen aspirin EC 81 MG tablet Take 1 tablet (81 mg total) by mouth daily. 90 tablet 3  . cholecalciferol (VITAMIN D) 1000 units tablet Take 1,000 Units by mouth daily.    . citalopram (CELEXA) 20 MG tablet Take 20 mg by mouth daily.    . clopidogrel (PLAVIX) 75 MG tablet Take 1 tablet (75 mg total) by mouth daily.    . famotidine (PEPCID) 20 MG tablet Take 20 mg by mouth daily.    . Febuxostat (ULORIC) 80 MG TABS Take 80 mg by mouth daily.     Marland Kitchen gabapentin (NEURONTIN) 300 MG capsule Take 300 mg by mouth 2 (two) times daily.    . hydrALAZINE (APRESOLINE) 100 MG tablet Take 1 tablet (100 mg total) by mouth 3 (three) times daily. 90 tablet 6  . insulin glargine (LANTUS) 100 UNIT/ML injection Inject 90 Units into the skin daily.    . Liraglutide (VICTOZA) 18 MG/3ML SOPN Inject 1.8 mg into the skin daily.    . Menthol, Topical Analgesic, (BIOFREEZE EX) Apply 1 application topically 2 (two) times daily as needed (knee/shoulder pain).     Marland Kitchen metolazone (ZAROXOLYN) 2.5 MG tablet Take 1.25 mg by mouth 3 (three) times a week. m-[id number]    . metoprolol succinate (TOPROL-XL) 50 MG 24 hr tablet Take 1 tablet (50 mg total) by mouth daily. Take with or immediately following a  meal. 30 tablet 3  . polyethylene glycol (MIRALAX / GLYCOLAX) packet Take 17 g by mouth daily as needed for moderate constipation.    . potassium chloride SA (KLOR-CON) 20 MEQ tablet Take 80 mEq by mouth 2 (two) times daily.    . rosuvastatin (CRESTOR) 20 MG tablet Take 20 mg by mouth daily.    . sennosides-docusate sodium (SENOKOT-S) 8.6-50 MG tablet Take 2 tablets by mouth 2 (two) times daily.     . Skin Protectants, Misc. (EUCERIN) cream Apply 1 application topically 2 (two) times daily.    Marland Kitchen torsemide (DEMADEX) 20 MG tablet Take 120 mg by mouth daily.     No current facility-administered medications for this visit.   Facility-Administered Medications Ordered in Other Visits  Medication Dose Route Frequency Provider Last Rate Last Admin  . chlorhexidine gluconate (MEDLINE KIT) (PERIDEX) 0.12 % solution 15 mL  15 mL Mouth Rinse BID Chesley Mires, MD      . MEDLINE mouth rinse  15 mL Mouth Rinse QID Chesley Mires, MD        REVIEW OF SYSTEMS:    10 Point review of Systems was done is negative except as noted above.  PHYSICAL EXAMINATION: ECOG PERFORMANCE STATUS: 3 - Symptomatic, >50% confined to bed  . Vitals:   11/05/19 1046  BP: (!) 156/74  Pulse: 71  Resp: 16  Temp: 98.2 F (36.8 C)  SpO2: 100%   Filed Weights   11/05/19 1046  Weight: (!) 331 lb (150.1 kg)   .Body mass index is 44.89 kg/m.  Exam was given in a wheelchair   GENERAL:alert, in no acute distress  and comfortable SKIN: no acute rashes, no significant lesions EYES: conjunctiva are pink and non-injected, sclera anicteric OROPHARYNX: MMM, no exudates, no oropharyngeal erythema or ulceration NECK: supple, no JVD LYMPH:  no palpable lymphadenopathy in the cervical, axillary or inguinal regions LUNGS: clear to auscultation b/l with normal respiratory effort HEART: regular rate & rhythm ABDOMEN:  normoactive bowel sounds , non tender, not distended. Extremity: no pedal edema PSYCH: alert & oriented x 3 with  fluent speech NEURO: no focal motor/sensory deficits  LABORATORY DATA:  I have reviewed the data as listed  . CBC Latest Ref Rng & Units 11/05/2019 08/20/2019 05/11/2019  WBC 4.0 - 10.5 K/uL 10.2 10.3 13.8(H)  Hemoglobin 13.0 - 17.0 g/dL 11.1(L) 10.6(L) 9.7(L)  Hematocrit 39.0 - 52.0 % 37.3(L) 35.6(L) 32.3(L)  Platelets 150 - 400 K/uL 173 212 168    . CMP Latest Ref Rng & Units 11/05/2019 10/15/2019 08/20/2019  Glucose 70 - 99 mg/dL 161(H) 233(H) 170(H)  BUN 8 - 23 mg/dL 65(H) 76(H) 72(H)  Creatinine 0.61 - 1.24 mg/dL 2.24(H) 2.39(H) 2.51(H)  Sodium 135 - 145 mmol/L 142 134(L) 136  Potassium 3.5 - 5.1 mmol/L 3.7 4.2 4.0  Chloride 98 - 111 mmol/L 101 96(L) 99  CO2 22 - 32 mmol/L 28 26 25  Calcium 8.9 - 10.3 mg/dL 9.1 8.8(L) 9.1  Total Protein 6.5 - 8.1 g/dL 8.0 - -  Total Bilirubin 0.3 - 1.2 mg/dL 0.4 - -  Alkaline Phos 38 - 126 U/L 82 - -  AST 15 - 41 U/L 14(L) - -  ALT 0 - 44 U/L 19 - -   Component     Latest Ref Rng & Units 11/05/2019 11/07/2019  WBC     4.0 - 10.5 K/uL 10.2   RBC     4.22 - 5.81 MIL/uL 4.71   Hemoglobin     13.0 - 17.0 g/dL 11.1 (L)   HCT     39.0 - 52.0 % 37.3 (L)   MCV     80.0 - 100.0 fL 79.2 (L)   MCH     26.0 - 34.0 pg 23.6 (L)   MCHC     30.0 - 36.0 g/dL 29.8 (L)   RDW     11.5 - 15.5 % 18.5 (H)   Platelets     150 - 400 K/uL 173   nRBC     0.0 - 0.2 % 0.0   Neutrophils     % 70   NEUT#     1.7 - 7.7 K/uL 7.3   Lymphocytes     % 16   Lymphocyte #     0.7 - 4.0 K/uL 1.6   Monocytes Relative     % 11   Monocyte #     0.1 - 1.0 K/uL 1.1 (H)   Eosinophil     % 1   Eosinophils Absolute     0.0 - 0.5 K/uL 0.1   Basophil     % 1   Basophils Absolute     0.0 - 0.1 K/uL 0.1   Immature Granulocytes     % 1   Abs Immature Granulocytes     0.00 - 0.07 K/uL 0.05   Sodium     135 - 145 mmol/L 142   Potassium     3.5 - 5.1 mmol/L 3.7   Chloride     98 - 111 mmol/L 101   CO2     22 - 32 mmol/L 28     Glucose     70 - 99 mg/dL 161 (H)     BUN     8 - 23 mg/dL 65 (H)   Creatinine     0.61 - 1.24 mg/dL 2.24 (H)   Calcium     8.9 - 10.3 mg/dL 9.1   Total Protein     6.5 - 8.1 g/dL 8.0   Albumin     3.5 - 5.0 g/dL 3.2 (L)   AST     15 - 41 U/L 14 (L)   ALT     0 - 44 U/L 19   Alkaline Phosphatase     38 - 126 U/L 82   Total Bilirubin     0.3 - 1.2 mg/dL 0.4   GFR, Est Non African American     >60 mL/min 28 (L)   GFR, Est African American     >60 mL/min 33 (L)   Anion gap     5 - 15 13   Total Protein, Urine-UPE24     Not Estab. mg/dL  46.7  Total Protein, Urine-Ur/day     30 - 150 mg/24 hr  514 (H)  ALBUMIN, U     %  57.2  ALPHA 1 URINE     %  1.3  Alpha 2, Urine     %  7.1  % BETA, Urine     %  15.1  GAMMA GLOBULIN URINE     %  19.3  Free Kappa Lt Chains,Ur     0.63 - 113.79 mg/L  20.13  Free Lambda Lt Chains,Ur     0.47 - 11.77 mg/L  21.16 (H)  Free Kappa/Lambda Ratio     1.03 - 31.76  0.95 (L)  Immunofixation Result, Urine       Comment (A)  Total Volume       1,100  M-SPIKE %, Urine     Not Observed %  5.5 (H)  M-Spike, mg/24 hr     Not Observed mg/24 hr  28 (H)  NOTE:       Comment  IgG (Immunoglobin G), Serum     603 - 1,613 mg/dL 1,940 (H)   IgA     61 - 437 mg/dL 300   IgM (Immunoglobulin M), Srm     15 - 143 mg/dL 137   Total Protein ELP     6.0 - 8.5 g/dL 7.2   Albumin SerPl Elph-Mcnc     2.9 - 4.4 g/dL 3.1   Alpha 1     0.0 - 0.4 g/dL 0.3   Alpha2 Glob SerPl Elph-Mcnc     0.4 - 1.0 g/dL 0.7   B-Globulin SerPl Elph-Mcnc     0.7 - 1.3 g/dL 1.2   Gamma Glob SerPl Elph-Mcnc     0.4 - 1.8 g/dL 1.9 (H)   M Protein SerPl Elph-Mcnc     Not Observed g/dL 1.1 (H)   Globulin, Total     2.2 - 3.9 g/dL 4.1 (H)   Albumin/Glob SerPl     0.7 - 1.7 0.8   IFE 1      Comment (A)   Please Note (HCV):      Comment   Kappa free light chain     3.3 - 19.4 mg/L 79.5 (H)   Lamda free light chains     5.7 - 26.3 mg/L 123.6 (H)   Kappa, lamda light chain ratio     0.26 - 1.65 0.64    Sed  Rate     0 - 16 mm/hr 60 (H)   LDH     98 - 192 U/L 275 (H)    24-Hr Ur UPEP/UIFE/Light Chains/TP      The value has a corrected status.      No reference range information available      Resulting Lab: [facility]      Comments:           (NOTE)           Immunofixation shows IgG monoclonal protein with lambda            light chain           specificity.           Bence Jones Protein positive; lambda type.  Multiple Myeloma Panel (SPEP&IFE w/QIG)      The value has a corrected status.      No reference range information available      Resulting Lab: [facility]      Comments:           (NOTE)           Immunofixation shows IgG monoclonal protein with lambda            light chain           Specificity.  RADIOGRAPHIC STUDIES: I have personally reviewed the radiological images as listed and agreed with the findings in the report. No results found.  ASSESSMENT & PLAN:   73 yo with   1) IgG Lambda monoclonal paraproteinemia 2) Cardiomyopathy - ? HTN, ischemic., DM2 3) CKD - likely related to HTN, DM2 PLAN: -Discussed patient's most recent labs from 08/20/2019, all values are WNL except for Hgb at 10.6, HCT at 35.6, MCH at 24.0, MCHC at 29.8, RDW at 16.1, Glucose at 170, BUN at 72, Creatinine at 2.51, GFR Est Af Am at 29. -Discussed 08/20/2019 MMP is as follows: all values are WNL except for IgG at 2176, IgM at 147, M Protein at 1.0, Total Globulin at 4.2. -Discussed 08/31/2019 NM CARDIAC AMYLOID TUMOR ([id number]) which revealed "Visual and quantitative assessment (grade 1, H/CL equal 1.2) are Not suggestive of transthyretin amyloidosis." -Advised pt that increased M Protein could suggest a primary plasma cell dyscrasia. -Pt has other explanations for cardiac and kidney dysfunction  -Advised pt that we would get additional labs, urine sample and whole body bone scan to w/u further - pt okay with these measures  -Advised pt that  if labs and scans are concerning, would get a BM Bx - would not like to jump directly into invasive testing as pt has other, more prominent, medical concerns at this time. -if in addition to other etiologies for his cardiomyopathy (HTN, DM2, ischemia) cardiology is concerned about cardiac AL Amyloidosis based on imaging - only definitive test to rule this out wwould be an endomyocardial biopsy -- if defer this decision to cardiology. -Recommend pt connect with his PCP, Cardiologist and Nephrologist for medication management  -Will get labs today  -Will get Bone Survey in 1 week  -Will see back in 3 weeks via phone   FOLLOW UP: Labs today Bone survey in 1 week Phone with Dr Kale in 3 weeks   All of the patients questions were answered with apparent satisfaction. The patient knows to call the clinic with any problems, questions or concerns.  I spent 40mins counseling the patient face to face. The total time spent in the   appointment  60 minutes and more than 50% was on counseling and direct patient cares.    Gautam Kale MD MS AAHIVMS [facility] C[facility] Hematology/Oncology Physician [facility]  (Office):       [phone] (Work cell):  [phone] (Fax):           [phone]  11/05/2019 4:34 AM  I, Jazzmine Knight, am acting as a scribe for Dr. Gautam Kale.   .I have reviewed the above documentation for accuracy and completeness, and I agree with the above. .Gautam Kishore Kale MD    

## 2019-11-06 ENCOUNTER — Telehealth: Payer: Self-pay | Admitting: Hematology

## 2019-11-06 LAB — KAPPA/LAMBDA LIGHT CHAINS
Kappa free light chain: 79.5 mg/L — ABNORMAL HIGH (ref 3.3–19.4)
Kappa, lambda light chain ratio: 0.64 (ref 0.26–1.65)
Lambda free light chains: 123.6 mg/L — ABNORMAL HIGH (ref 5.7–26.3)

## 2019-11-06 NOTE — Telephone Encounter (Signed)
Scheduled per 03/22 los, patient has been called and notified. ?

## 2019-11-07 ENCOUNTER — Other Ambulatory Visit: Payer: Self-pay

## 2019-11-07 ENCOUNTER — Ambulatory Visit (HOSPITAL_COMMUNITY)
Admission: RE | Admit: 2019-11-07 | Discharge: 2019-11-07 | Disposition: A | Discharge status: Home / Self Care | Payer: Medicare (Managed Care) | Source: Ambulatory Visit | Attending: Cardiology | Admitting: Cardiology

## 2019-11-07 DIAGNOSIS — D472 Monoclonal gammopathy: Secondary | ICD-10-CM

## 2019-11-07 DIAGNOSIS — R0989 Other specified symptoms and signs involving the circulatory and respiratory systems: Secondary | ICD-10-CM | POA: Diagnosis present

## 2019-11-07 LAB — MULTIPLE MYELOMA PANEL, SERUM
Albumin SerPl Elph-Mcnc: 3.1 g/dL (ref 2.9–4.4)
Albumin/Glob SerPl: 0.8 (ref 0.7–1.7)
Alpha 1: 0.3 g/dL (ref 0.0–0.4)
Alpha2 Glob SerPl Elph-Mcnc: 0.7 g/dL (ref 0.4–1.0)
B-Globulin SerPl Elph-Mcnc: 1.2 g/dL (ref 0.7–1.3)
Gamma Glob SerPl Elph-Mcnc: 1.9 g/dL — ABNORMAL HIGH (ref 0.4–1.8)
Globulin, Total: 4.1 g/dL — ABNORMAL HIGH (ref 2.2–3.9)
IgA: 300 mg/dL (ref 61–437)
IgG (Immunoglobin G), Serum: 1940 mg/dL — ABNORMAL HIGH (ref 603–1613)
IgM (Immunoglobulin M), Srm: 137 mg/dL (ref 15–143)
M Protein SerPl Elph-Mcnc: 1.1 g/dL — ABNORMAL HIGH
Total Protein ELP: 7.2 g/dL (ref 6.0–8.5)

## 2019-11-09 LAB — UPEP/UIFE/LIGHT CHAINS/TP, 24-HR UR
% BETA, Urine: 15.1 %
ALPHA 1 URINE: 1.3 %
Albumin, U: 57.2 %
Alpha 2, Urine: 7.1 %
Free Kappa Lt Chains,Ur: 20.13 mg/L (ref 0.63–113.79)
Free Kappa/Lambda Ratio: 0.95 — ABNORMAL LOW (ref 1.03–31.76)
Free Lambda Lt Chains,Ur: 21.16 mg/L — ABNORMAL HIGH (ref 0.47–11.77)
GAMMA GLOBULIN URINE: 19.3 %
M-SPIKE %, Urine: 5.5 % — ABNORMAL HIGH
M-Spike, Mg/24 Hr: 28 mg/24 hr — ABNORMAL HIGH
Total Protein, Urine-Ur/day: 514 mg/24 hr — ABNORMAL HIGH (ref 30–150)
Total Protein, Urine: 46.7 mg/dL
Total Volume: 1100

## 2019-11-19 ENCOUNTER — Other Ambulatory Visit: Payer: Self-pay

## 2019-11-19 ENCOUNTER — Ambulatory Visit (HOSPITAL_COMMUNITY)
Admission: RE | Admit: 2019-11-19 | Discharge: 2019-11-19 | Disposition: A | Discharge status: Home / Self Care | Payer: Medicare (Managed Care) | Source: Ambulatory Visit | Attending: Hematology | Admitting: Hematology

## 2019-11-19 DIAGNOSIS — D472 Monoclonal gammopathy: Secondary | ICD-10-CM | POA: Insufficient documentation

## 2019-11-26 ENCOUNTER — Ambulatory Visit: Payer: Medicare (Managed Care) | Admitting: Hematology

## 2019-11-27 ENCOUNTER — Inpatient Hospital Stay: Payer: Medicare (Managed Care) | Attending: Hematology | Admitting: Hematology

## 2019-11-27 DIAGNOSIS — G4733 Obstructive sleep apnea (adult) (pediatric): Secondary | ICD-10-CM | POA: Diagnosis not present

## 2019-11-27 DIAGNOSIS — E1122 Type 2 diabetes mellitus with diabetic chronic kidney disease: Secondary | ICD-10-CM | POA: Diagnosis not present

## 2019-11-27 DIAGNOSIS — I129 Hypertensive chronic kidney disease with stage 1 through stage 4 chronic kidney disease, or unspecified chronic kidney disease: Secondary | ICD-10-CM | POA: Insufficient documentation

## 2019-11-27 DIAGNOSIS — M25551 Pain in right hip: Secondary | ICD-10-CM | POA: Diagnosis not present

## 2019-11-27 DIAGNOSIS — Z8261 Family history of arthritis: Secondary | ICD-10-CM | POA: Diagnosis not present

## 2019-11-27 DIAGNOSIS — D472 Monoclonal gammopathy: Secondary | ICD-10-CM | POA: Diagnosis not present

## 2019-11-27 DIAGNOSIS — Z8673 Personal history of transient ischemic attack (TIA), and cerebral infarction without residual deficits: Secondary | ICD-10-CM | POA: Insufficient documentation

## 2019-11-27 DIAGNOSIS — N1832 Chronic kidney disease, stage 3b: Secondary | ICD-10-CM | POA: Diagnosis not present

## 2019-11-27 DIAGNOSIS — Z836 Family history of other diseases of the respiratory system: Secondary | ICD-10-CM | POA: Insufficient documentation

## 2019-11-27 DIAGNOSIS — Z9181 History of falling: Secondary | ICD-10-CM | POA: Insufficient documentation

## 2019-11-27 DIAGNOSIS — Z8249 Family history of ischemic heart disease and other diseases of the circulatory system: Secondary | ICD-10-CM | POA: Insufficient documentation

## 2019-11-27 DIAGNOSIS — Z87891 Personal history of nicotine dependence: Secondary | ICD-10-CM | POA: Diagnosis not present

## 2019-11-27 DIAGNOSIS — I10 Essential (primary) hypertension: Secondary | ICD-10-CM | POA: Diagnosis not present

## 2019-11-27 DIAGNOSIS — R0602 Shortness of breath: Secondary | ICD-10-CM | POA: Diagnosis not present

## 2019-11-27 DIAGNOSIS — Z79899 Other long term (current) drug therapy: Secondary | ICD-10-CM | POA: Diagnosis not present

## 2019-11-27 IMAGING — CR DG CHEST 2V
3 series · 3 of 3 positions shown · non-contrast
Comparison: Chest x-ray dated June 21, 2017.

CLINICAL DATA: Chronic respiratory failure.

EXAM:
CHEST  2 VIEW

[w chest lat (1 of 2)]
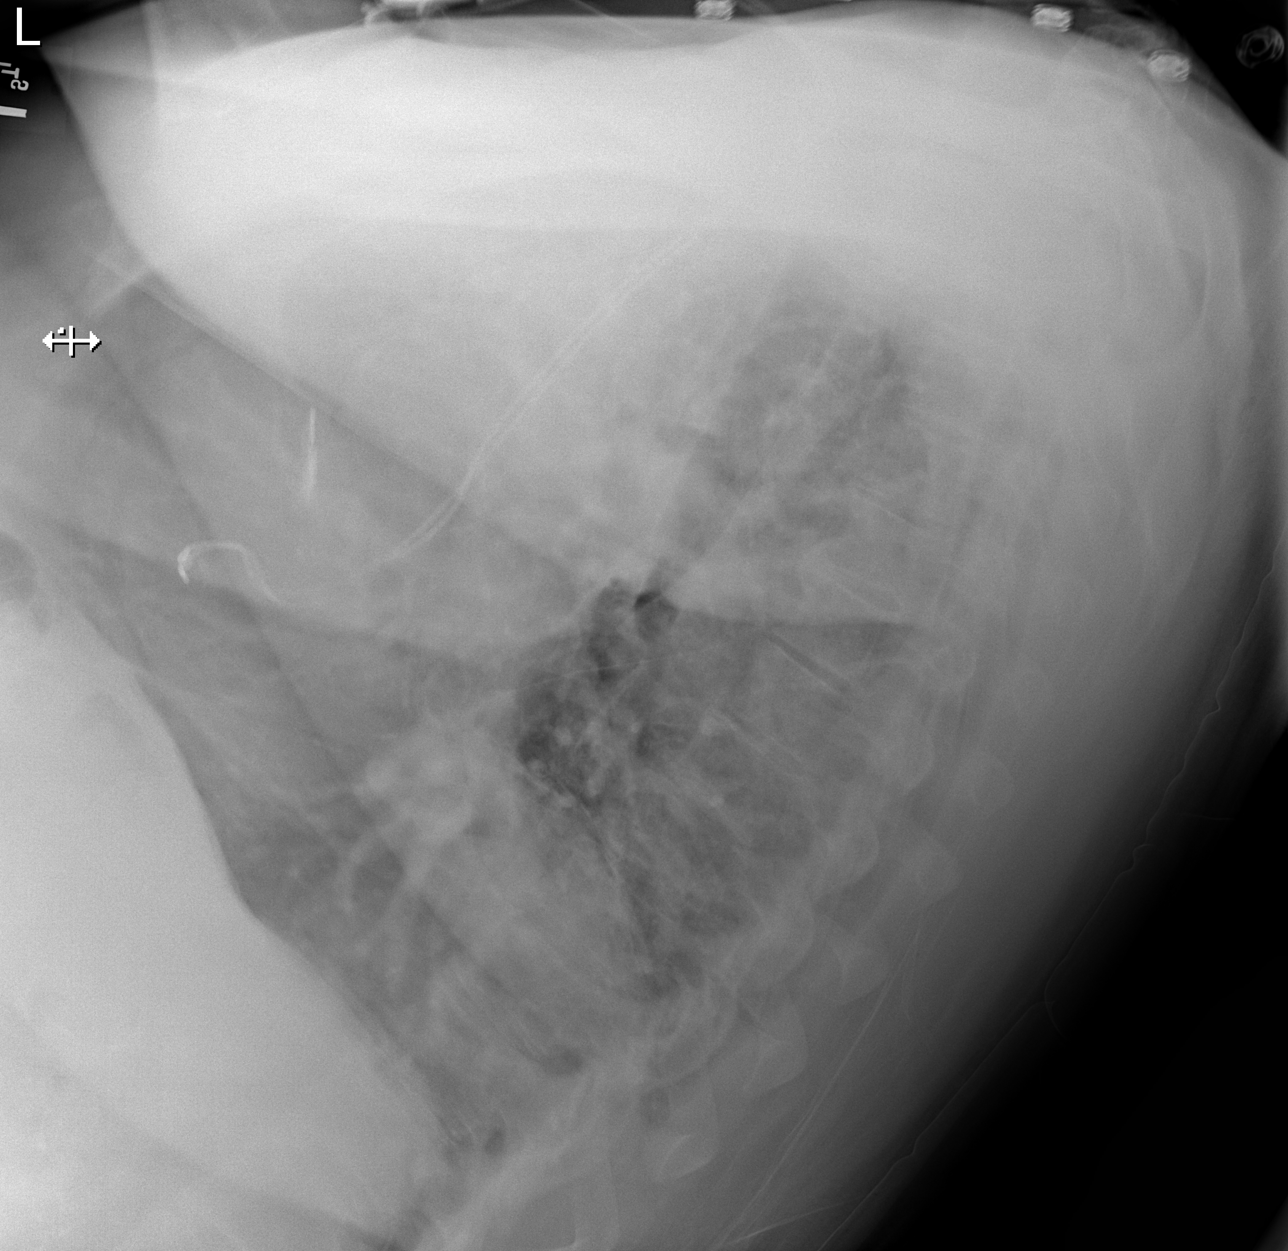

[w chest lat (2 of 2)]
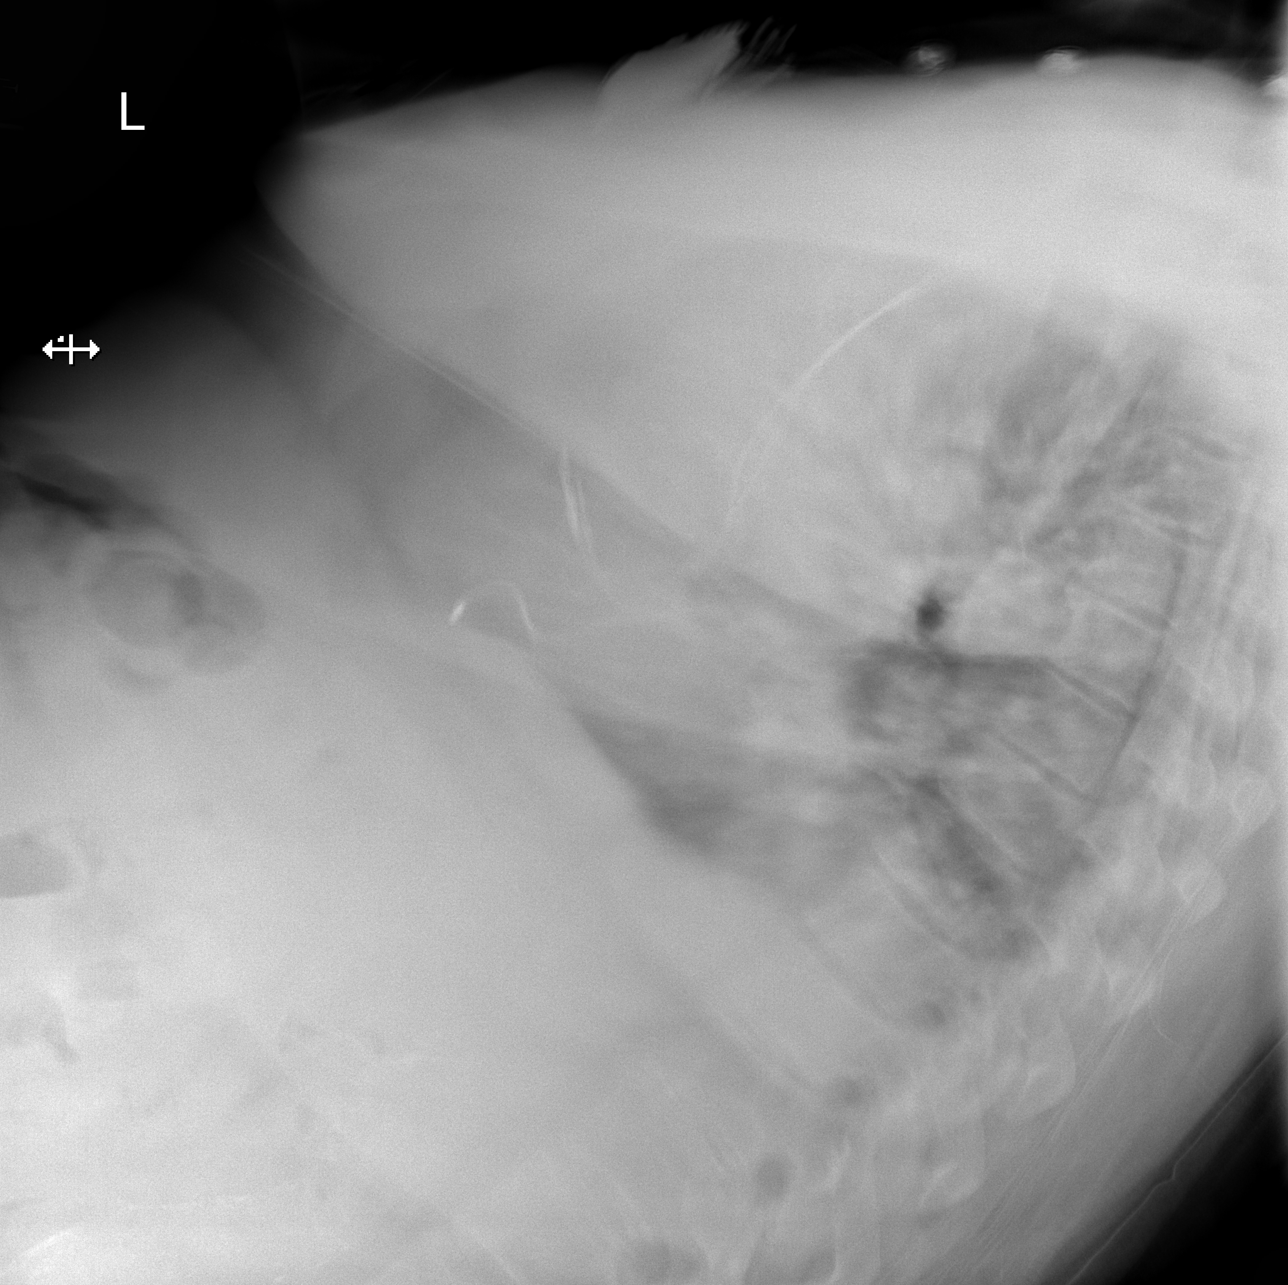

[x chest ap]
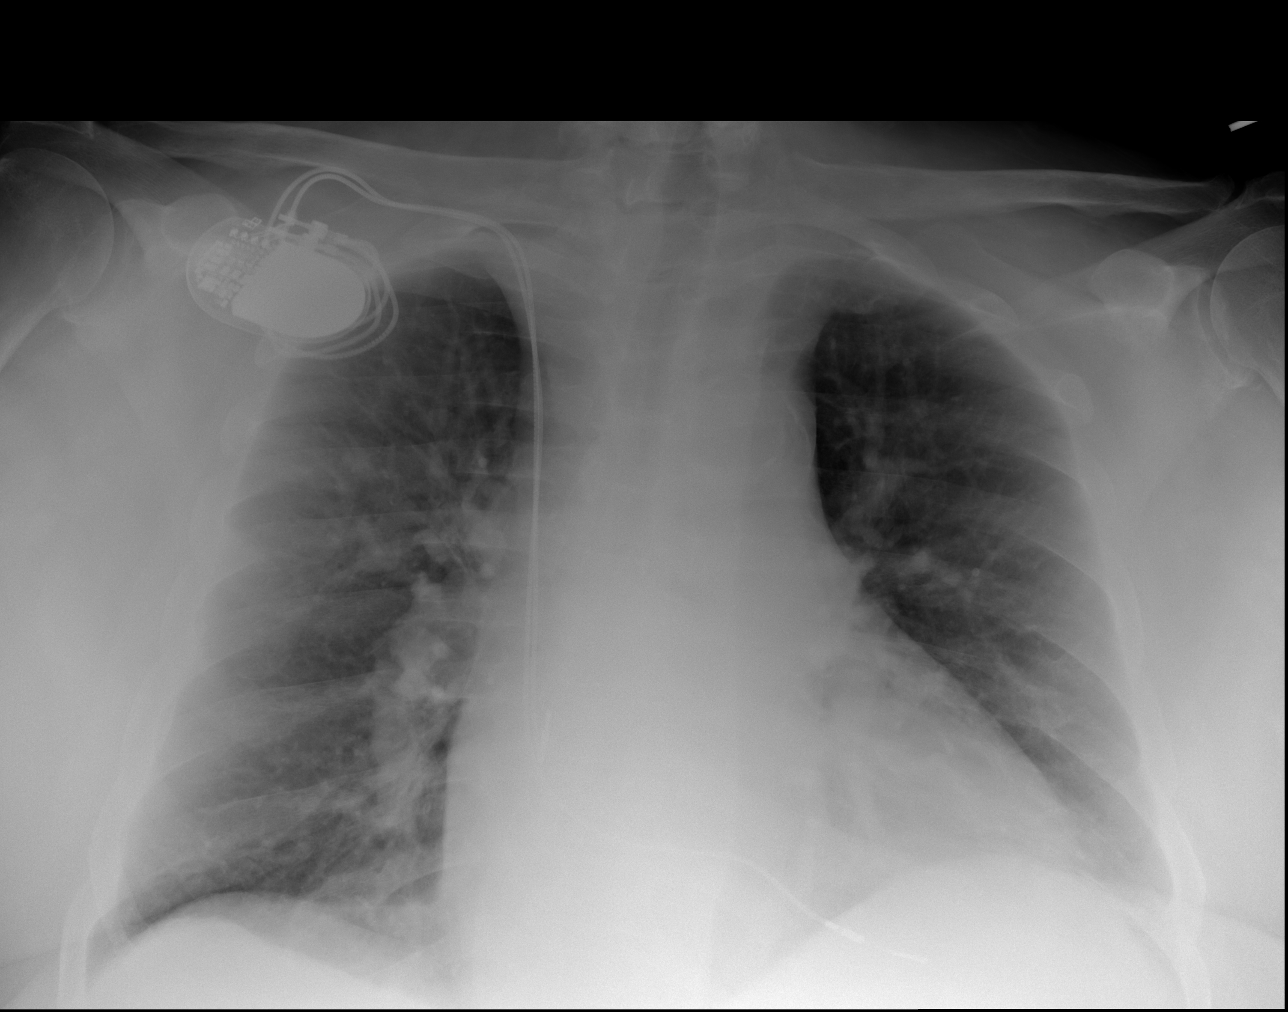

[3 of 3 positions shown; findings below may reference images not displayed]

FINDINGS: Unchanged right chest wall pacer device. Stable moderate
cardiomegaly. Pulmonary vascular congestion and interstitial edema
appear improved when compared to prior study. No focal
consolidation, pleural effusion, or pneumothorax. No acute osseous
abnormality.
IMPRESSION: Stable moderate cardiomegaly with improved vascular congestion and
interstitial edema when compared to prior study.

## 2019-11-27 IMAGING — CT CT HEAD W/O CM
3 series · 15 of 47 positions shown, 18 images · non-contrast
Comparison: CT head dated June 07, 2017.

CLINICAL DATA: Posterior headache for the past 2 days. Intermittent
tremors.

EXAM:
CT HEAD WITHOUT CONTRAST
TECHNIQUE: Contiguous axial images were obtained from the base of the skull
through the vertex without intravenous contrast.

[Series 2: head wo · axial · 0.47mm/px · z∈[-105,+30]mm · 9 of 33 slices shown, 12 images]
[im 3/33  brain]
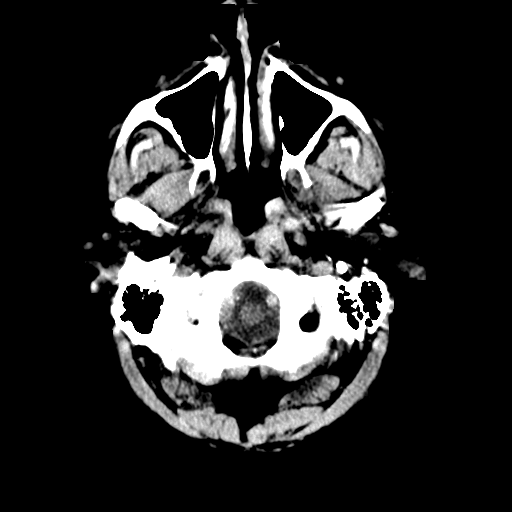
[im 3/33  bone]
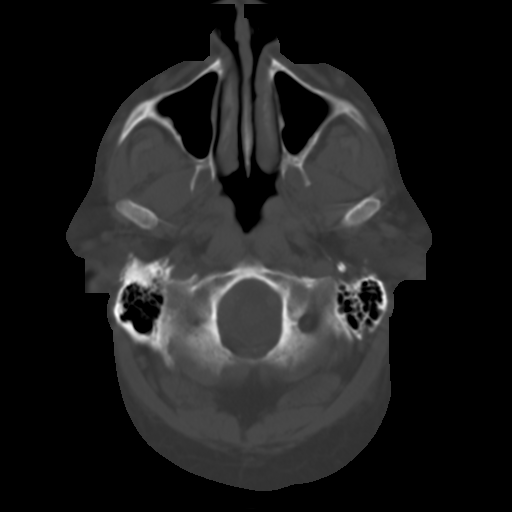
[im 6/33  brain]
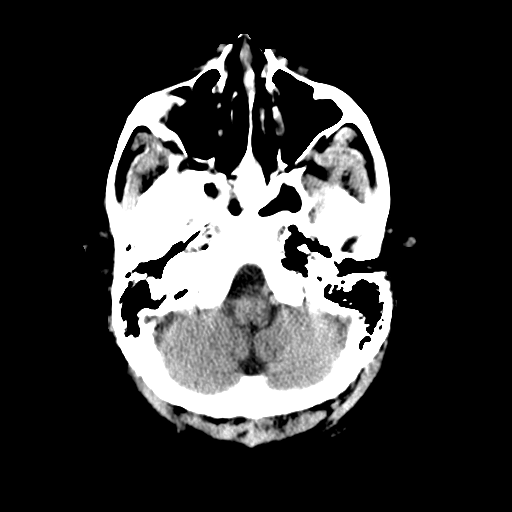
[im 9/33  brain]
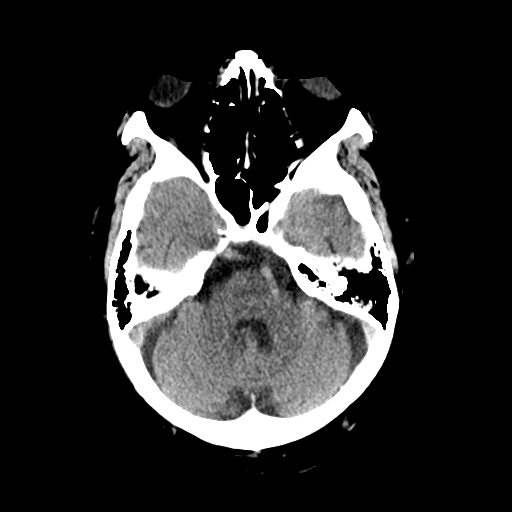
[im 13/33  brain]
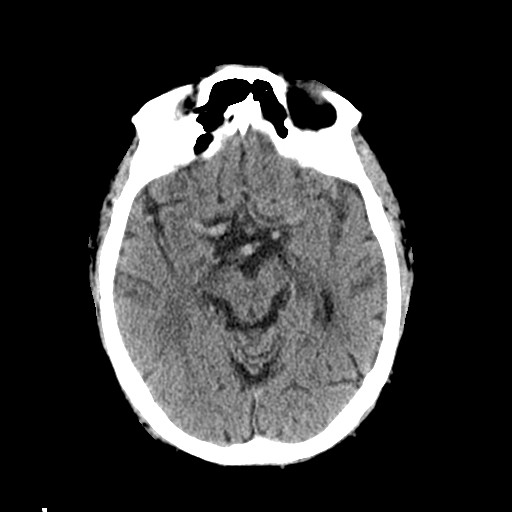
[im 17/33  brain]
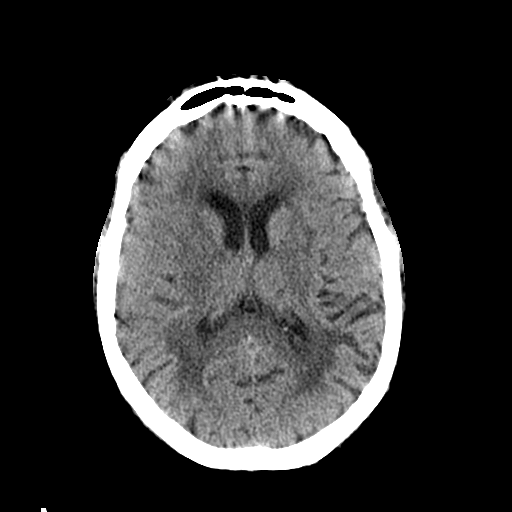
[im 17/33  bone]
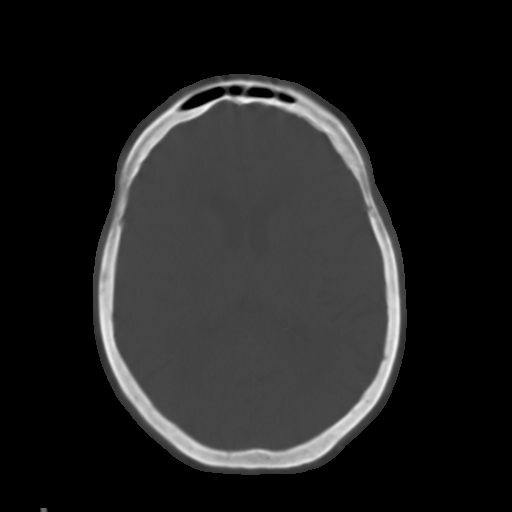
[im 20/33  brain]
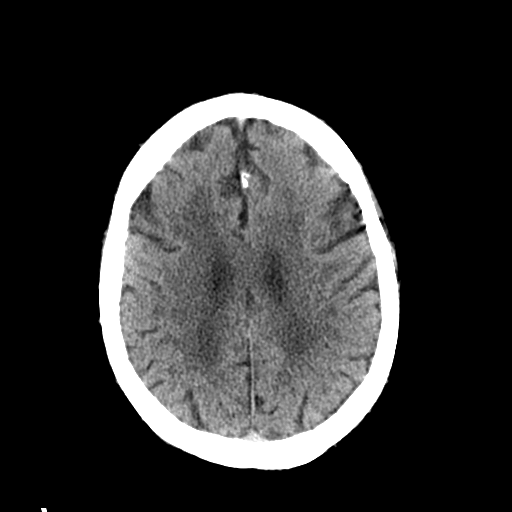
[im 24/33  brain]
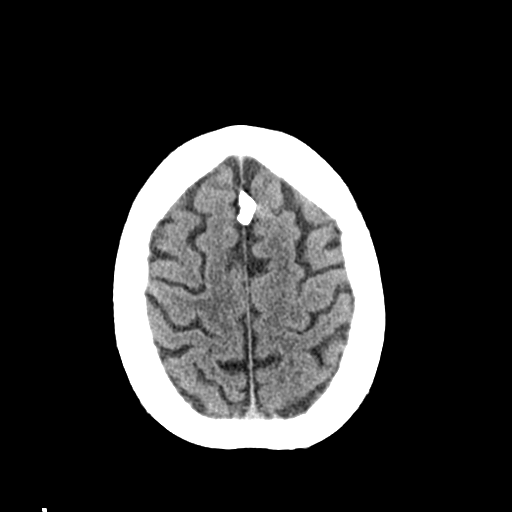
[im 27/33  brain]
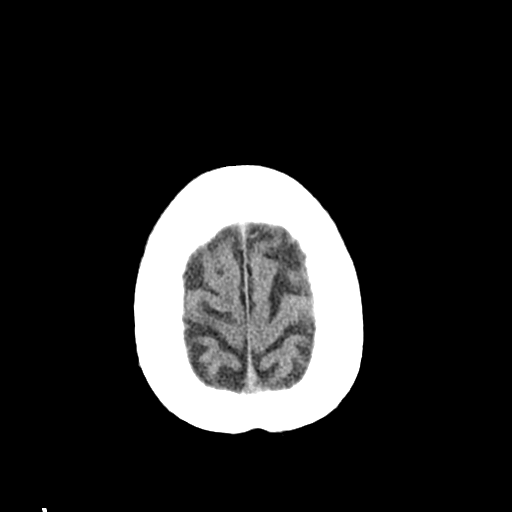
[im 30/33  brain]
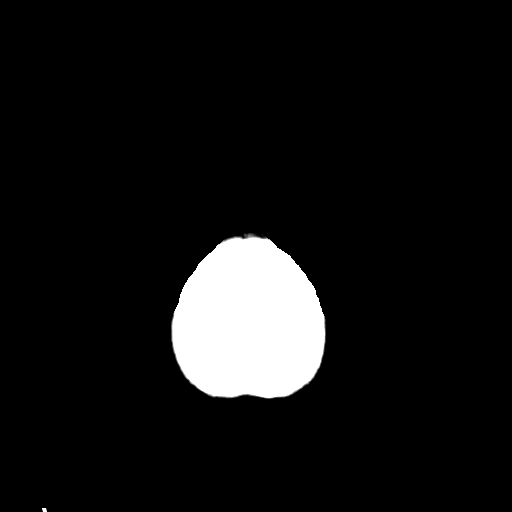
[im 30/33  bone]
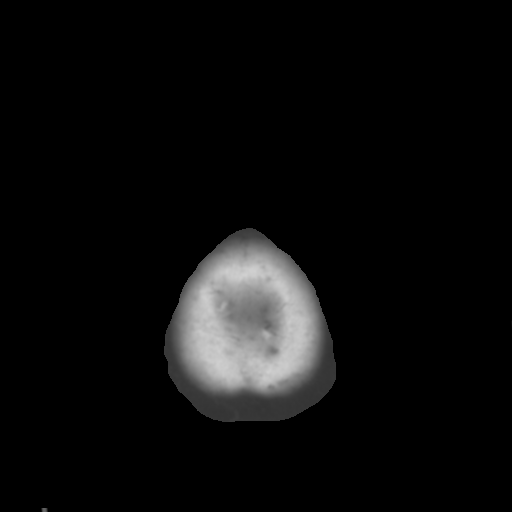

[Series 5: coronal soft tissue · coronal · 0.34mm/px · 3 of 72 slices shown]
[im 27/72  brain]
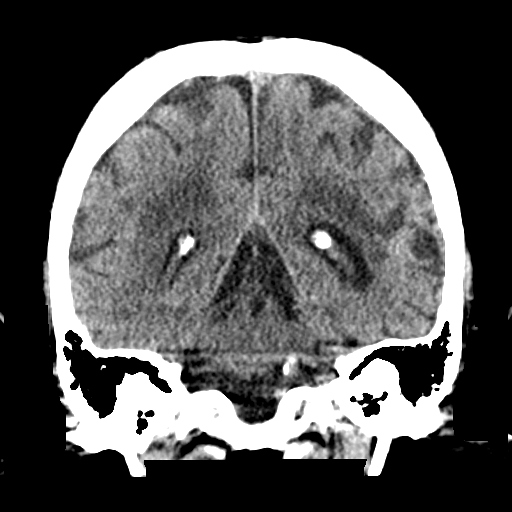
[im 33/72  brain]
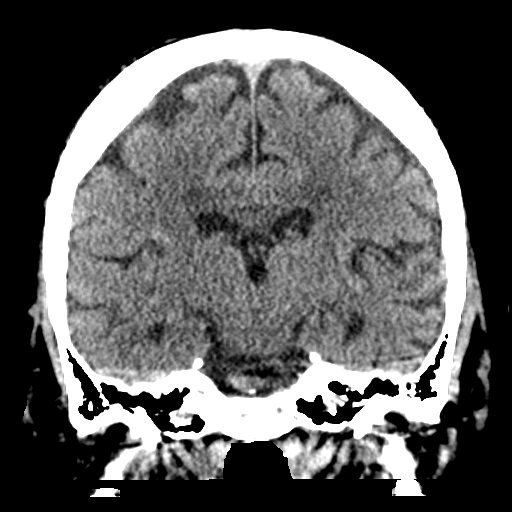
[im 39/72  brain]
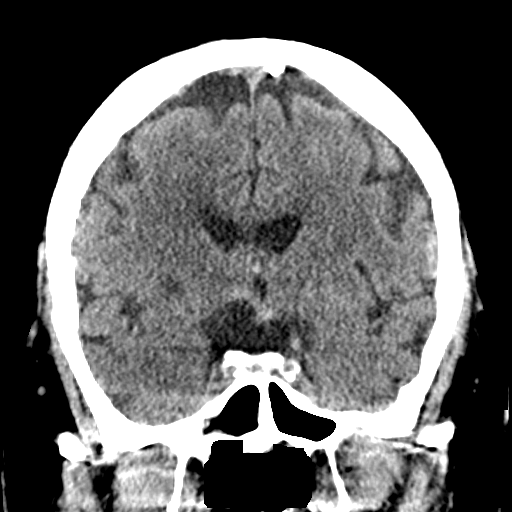

[Series 6: sagittal soft tissue · sagittal · 0.34mm/px · 3 of 58 slices shown]
[im 20/58  brain]
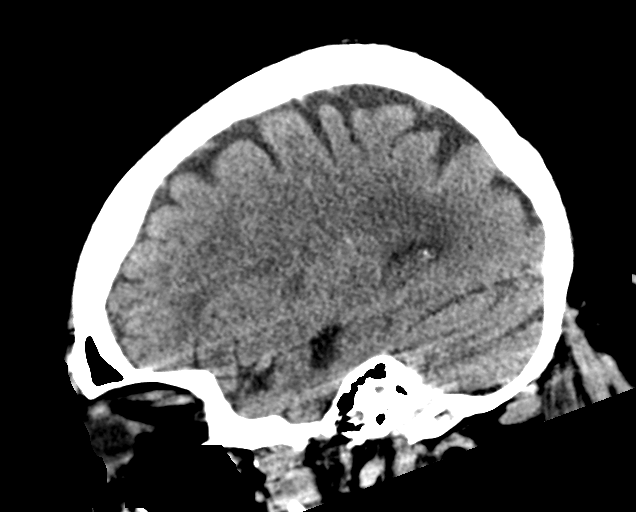
[im 29/58  brain]
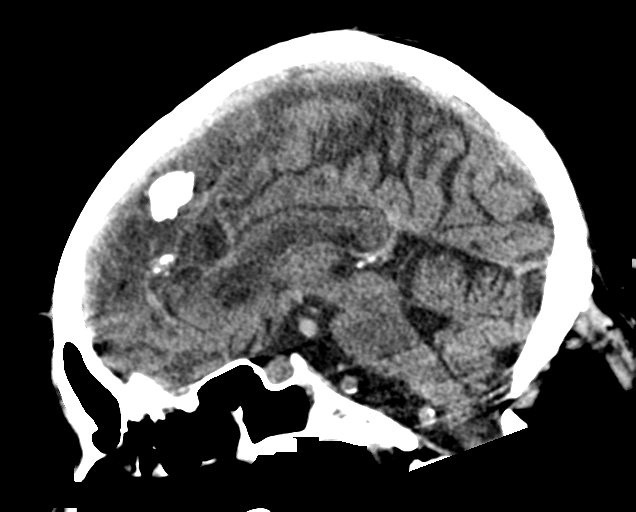
[im 39/58  brain]
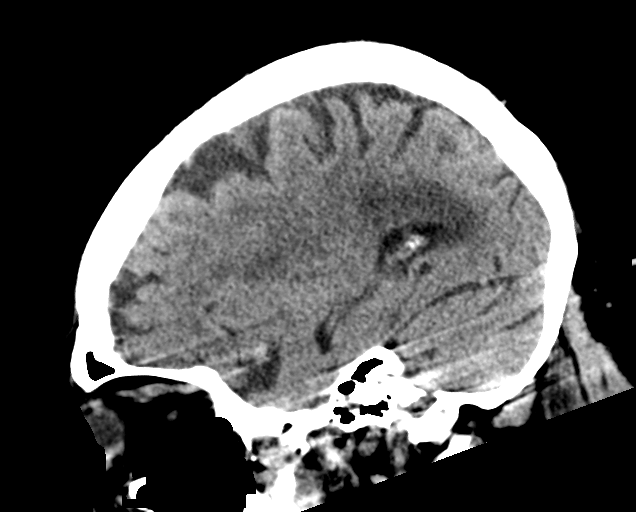

[15 of 47 positions shown; findings below may reference images not displayed]

FINDINGS: Brain: No evidence of acute infarction, hemorrhage, hydrocephalus,
extra-axial collection or mass lesion/mass effect. Unchanged
encephalomalacia in the posterior left temporal lobe and lacunar
infarct in the right basal ganglia. Stable mild atrophy and
extensive chronic microvascular ischemic white matter disease.
Unchanged enlarged pituitary gland, measuring 1 cm.

Vascular: Calcified atherosclerosis at the skullbase. No hyperdense
vessel.

Skull: No fracture or focal lesion.

Sinuses/Orbits: Mild mucosal thickening of the ethmoid air cells and
right sphenoid sinus. The mastoid air cells are clear. No acute
orbital abnormality. Unchanged bilateral proptosis.

Other: None.
IMPRESSION: 1. No acute intracranial abnormality. Stable atrophy and chronic
microvascular ischemic white matter disease.
2. Unchanged enlarged pituitary gland, measuring 1 cm. No further
imaging evaluation or follow-up is necessary. Consider endocrine
function tests and correlate for history of pituitary
hypersecretion. This follows ACR consensus guidelines: Management of
Incidental Pituitary Findings on CT, MRI and F18-FDG PET: A White
Paper of the ACR Incidental Findings Committee. [HOSPITAL]

## 2019-11-27 NOTE — Progress Notes (Signed)
HEMATOLOGY/ONCOLOGY CONSULTATION NOTE  Date of Service: 11/27/2019  Patient Care Team: [name], MD as PCP - General (Family Medicine) Ara Kussmaul, MD as Consulting Physician (Ophthalmology) [facility], Michae Kava, MD (Cardiology)  CHIEF COMPLAINTS/PURPOSE OF CONSULTATION:  Monoclonal paraproteinemia  HISTORY OF PRESENTING ILLNESS:   [name] is a wonderful 74 y.o. male who has been referred to [place] by Dr Bradd Burner for evaluation and management of Monoclonal antibody IgG elevation. The pt reports that he is doing well overall.   The pt reports that he feels he has been doing well recently but that his breathing has been "up and down". He was placed on oxgen about 6 months ago. Pt previously smoked about 1 pack of cigarettes every 6 months. He also drank alcohol but this was may years ago. Pt has been using his CPAP machine at night but not when he sleeps during the day. Pt does not seem sure that his diabetes has been well-controlled. He has had CKD for awhile and was told that it was caused by his HTN and Diabetes. Pt has never required dialysis. He lost his eyesight around seven years ago. Pt has had a heart attack and has required a Cardiac Catheterization. He also had a stroke about 3-4 years ago.   Pt currently lives alone but has an aid that comes in 2 hours in the morning and 3 hours in the evening. He notes that his aid advised him this morning that his urine looked abnormal. He has been having pain in his right hip and leg for the last year. Pt has spoken to Dr Bradd Burner about this discomfort and he believes that this is due to a previous fall.   Of note prior to the patient's visit today, pt has had NM CARDIAC AMYLOID TUMOR ([id number]) completed on 08/31/2019 with results revealing "Visual and quantitative assessment (grade 1, H/CL equal 1.2) are Not suggestive of transthyretin amyloidosis."   Most recent lab results (08/20/2019) of CBC and BMP is as follows:  all values are WNL except for Hgb at 10.6, HCT at 35.6, MCH at 24.0, MCHC at 29.8, RDW at 16.1, Glucose at 170, BUN at 72, Creatinine at 2.51, GFR Est Af Am at 29. 08/20/2019 MMP is as follows: all values are WNL except for IgG at 2176, IgM at 147, M Protein at 1.0, Total Globulin at 4.2.   On review of systems, pt reports pain in right hip/leg, SOB, abnormal looking urine and denies any other symptoms.   On PMHx the pt reports Atrial Fibrillation, CKD, Hypertensive Cardiomyopathy, CAD, Type II Diabetes, HLD, HTN, Pacemaker, Sleep apnea, Legally blind, Cardiac Catheterization, Cardiac Angiography, TEE. On Social Hx the pt reports previous history of smoking (1 pack in 6 months) and alcohol consumption. He stopped years ago.   INTERVAL HISTORY:  I connected with  [name] on 11/27/19 by telephone and verified that I am speaking with the correct person using two identifiers.   I discussed the limitations of evaluation and management by telemedicine. The patient expressed understanding and agreed to proceed.  Other persons participating in the visit and their role in the encounter:       -Yevette Edwards, Medical Scribe  Patient's location: Home Provider's location: [facility] [place] is a wonderful 74 y.o. male who is here for evaluation and management of Monoclonal antibody IgG elevation. The patient's last visit with [place] was on 11/05/2019. The pt reports that he is doing well overall.  The pt  reports that his has been experiencing leg swelling and having trouble with his pacemaker.   Of note since the patient's last visit, pt has had Bone Survey ([id number]) completed on 11/19/2019 with results revealing "Negative for lytic bone lesions or fractures."  Lab results (11/05/19) of CBC w/diff and CMP is as follows: all values are WNL except for Hgb at 11.1, HCT at 37.3, MCV at 79.2, MCH at 23.6, MCHC at 29.8, RDW at 18.5, Mono Abs at 1.1K, Glucose at 161, BUN at 65,  Creatinine at 2.24, Albumin at 3.2,  AST at 14, GFR Est Af Am at 33. 11/05/2019 LDH at 275 11/05/2019 Sed Rate at 60 11/05/2019 MMP is as follows: all values are WNL except for IgG at 1940, Gamma Glob at 1.9, M Protein at 1.1, Total Globulin at 4.1 11/05/2019 K/L light chains is as follows: Kappa free light chain at 79.5, Lamda free light chains at 123.6, K/L light chain ratio at 0.64 11/07/2019 24-Hr UPEP is as follows: all values are WNL except for Total Protein Urine at 514, Free Lambda Lt Chains at 21.16, Free K/L Ratio at 0.95, M-Spike Urine at 5.5,% M-Spike Mg/24 Hr at 20m/24h   On review of systems, pt reports leg swelling and denies any other symptoms.    MEDICAL HISTORY:  Past Medical History:  Diagnosis Date  . Arthritis    "all over" (06/23/2013)  . Atrial fibrillation ([facility]    not Candidate for Coumadin d/t rectal bleeding  . Benign prostatic hypertrophy   . Cardiomyopathy, hypertensive ([facility]     diastolic dysfunction by [id number]  . Coronary artery disease     nonobstructive. 2 vessel. cardiac catheter in March 2005 showing 60% LAD occlusion, 30% circumflex occlusion.  . Degenerative joint disease     Right knee.  . Diabetes type 2, uncontrolled ([facility]   . Diabetic peripheral neuropathy ([facility]   . Diabetic ulcer of thigh ([facility]    H/O Rt thigh ulcer.  . Exertional shortness of breath    "comes and goes" (06/23/2013)  . GERD (gastroesophageal reflux disease)   . Gout   . Herpes   . Hyperlipidemia   . Hypertension   . Hypotension 09/05/2013  . Incarcerated ventral hernia    H/O. S/P repair ventral hernia repair 07/2005.  .Marland KitchenLBBB (left bundle branch block)   . Legally blind   . Morbid obesity ([facility]   . Obstructive sleep apnea    On CPAP. Last sleep study (06/2009) - Severe obstructive sleep apnea/hypopnea syndrome, apnea-hypopnea index 70.5 per hour with non positional events moderately loud snoring and oxygen desaturation to a nadir of 85% on room air.  . Occult  blood positive stool     History of in August 2007.  colonoscopy in January 2008 showing normal colon with fair inadequate prep. By Dr. CSilvano Rusk  . 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 Sepsis ([facility] 08/26/2013    SURGICAL HISTORY: Past Surgical History:  Procedure Laterality Date  . AMPUTATION Left 06/22/2013   Procedure: AMPUTATION FOOT;  Surgeon: MNewt Minion MD;  [facility]  Service: Orthopedics;  Laterality: Left;  Left Midfoot Amputation  . CARDIAC CATHETERIZATION  10/22/2003   Dilatation of the LV with preserved LV systolic function. Tortous aorta, but no AAA. Moderate disease with 50-60% area of narrowing in the circumflex after the second OM and 70% mid narrowing in the LAD. No intervention.  .Marland KitchenCARDIOVASCULAR STRESS TEST  01/23/2010   Perfusion defect seen  in inferior myocardial region is consistent with diaphragmatic attenuation. Remaining myocardium demonstrates normal myocardial perfusion with no evidence of ischemia or infarct. Stress ECG was non-diagnostic due to LBBB.  Marland Kitchen CATARACT EXTRACTION, BILATERAL Bilateral   . ESOPHAGOGASTRODUODENOSCOPY N/A 10/12/2013   Procedure: ESOPHAGOGASTRODUODENOSCOPY (EGD);  Surgeon: Gwenyth Ober, MD;  Location: [facility];  Service: General;  Laterality: N/A;  bedside  . EYE SURGERY    . 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 HERNIA REPAIR    . INSERT / REPLACE / REMOVE PACEMAKER  2003/11/14   Secondary to symptomatic bradycardia. DDDR pacemaker. By Dr. Rollene Fare.  Randolm Idol / REPLACE / REMOVE PACEMAKER  11-13-12   "old pacemaker died; had new one put in" (Jul 07, 2013)  . Pacemaker lead revision  12/2003    likely microdislodgment  of right ventricular lead  . PEG PLACEMENT N/A 10/12/2013   Procedure: PERCUTANEOUS ENDOSCOPIC GASTROSTOMY (PEG) PLACEMENT;  Surgeon: Gwenyth Ober, MD;  Location: [facility];  Service: General;  Laterality: N/A;  . PERMANENT PACEMAKER GENERATOR CHANGE N/A  11/03/2012   Procedure: PERMANENT PACEMAKER GENERATOR CHANGE;  Surgeon: Sanda Klein, MD;  Location: [facility];  Service: Cardiovascular;  Laterality: N/A;  . REFRACTIVE SURGERY Bilateral   . RIGHT HEART CATH N/A 09/05/2018   Procedure: RIGHT HEART CATH;  Surgeon: Larey Dresser, MD;  Location: [facility];  Service: Cardiovascular;  Laterality: N/A;  . RIGHT/LEFT HEART CATH AND CORONARY ANGIOGRAPHY N/A 04/10/2019   Procedure: RIGHT/LEFT HEART CATH AND CORONARY ANGIOGRAPHY;  Surgeon: [name], MD;  Location: [facility];  Service: Cardiovascular;  Laterality: N/A;  . TEE WITHOUT CARDIOVERSION N/A 09/05/2018   Procedure: TRANSESOPHAGEAL ECHOCARDIOGRAM (TEE);  Surgeon: Larey Dresser, MD;  Location: [facility] ENDOS[facility];  Service: Cardiovascular;  Laterality: N/A;  . TEE WITHOUT CARDIOVERSION N/A 05/08/2019   Procedure: TRANSESOPHAGEAL ECHOCARDIOGRAM (TEE);  Surgeon: [name], MD;  Location: [facility] LAB;  Service: Open Heart Surgery;  Laterality: N/A;  . TRANSCATHETER AORTIC VALVE REPLACEMENT, TRANSFEMORAL N/A 05/08/2019   Procedure: TRANSCATHETER AORTIC VALVE REPLACEMENT, TRANSFEMORAL;  Surgeon: [name], MD;  Location: [facility];  Service: Open Heart Surgery;  Laterality: N/A;  . TRANSTHORACIC ECHOCARDIOGRAM  01/01/2013   EF 50-55%, ventricular septum-thickness moderately increased  . VENOUS DOPPLER  01/17/2013   No evidence of thrombus or thrombophlebitis. Left GSVs appear enlarged and demonstrate valvular insuffiiciency. Bilateral SSVs appeared patent with no valvular insufficiency seen.  Marland Kitchen VENTRAL HERNIA REPAIR  07/2005    S/P laparotomy, lysis of adhesions, repair of incarcerated ventral hernia with  partial omentectomy.. Performed by Dr. Marlou Starks.    SOCIAL HISTORY: Social History   Socioeconomic History  . Marital status: Divorced    Spouse name: Not on file  . Number of children: 4  . Years of education: 84  . Highest education level: 12th  grade  Occupational History  . Occupation: disabled  Tobacco Use  . Smoking status: Former Smoker    Packs/day: 0.00    Years: 0.00    Pack years: 0.00    Types: Cigarettes    Quit date: 08/17/2003    Years since quitting: 16.2  . Smokeless tobacco: Never Used  . Tobacco comment: started at age 34.  only smoked on occ- very rarely.0  Substance and Sexual Activity  . Alcohol use: Yes    Comment: 07-Jul-2013 "drank some; never had a problem w/it"  . Drug use: No  . Sexual activity: Never  Other Topics Concern  . Not on file  Social History Narrative   12/10- reports issues with housing- states it is not disability friendly but has ramp, first floor apt, and has grab bars/assistive devices provided by [employer].  Reports that he has concerns with having enough food but gets meals on wheels, eats at [employer] 3x a week, and has personal aid who cooks and shops for him.  Reports concerns with transportation but gets [facility] transport for all of his medical appts and his family or an aid helps him with other transportation needs.   Social Determinants of Health   Financial Resource Strain:   . Difficulty of Paying Living Expenses:   Food Insecurity:   . Worried About Charity fundraiser in the Last Year:   . Arboriculturist in the Last Year:   Transportation Needs:   . Film/video editor (Medical):   Marland Kitchen Lack of Transportation (Non-Medical):   Physical Activity:   . Days of Exercise per Week:   . Minutes of Exercise per Session:   Stress:   . Feeling of Stress :   Social Connections:   . Frequency of Communication with Friends and Family:   . Frequency of Social Gatherings with Friends and Family:   . Attends Religious Services:   . Active Member of Clubs or Organizations:   . Attends Archivist Meetings:   Marland Kitchen Marital Status:   Intimate Partner Violence:   . Fear of Current or Ex-Partner:   . Emotionally Abused:   Marland Kitchen Physically Abused:   . Sexually Abused:     FAMILY  HISTORY: Family History  Problem Relation Age of Onset  . Coronary artery disease Sister   . Heart disease Sister   . Allergies Sister   . Asthma Brother   . Rheum arthritis Brother     ALLERGIES:  has No Known Allergies.  MEDICATIONS:  Current Outpatient Medications  Medication Sig Dispense Refill  . acetaminophen (TYLENOL) 650 MG CR tablet Take 1,300 mg by mouth 2 (two) times daily.     Marland Kitchen amLODipine (NORVASC) 10 MG tablet Take 10 mg by mouth daily. Reported on 12/09/2015    . amoxicillin (AMOXIL) 500 MG capsule Take 4 capsules by mouth one hour prior to dental work 4 capsule 5  . antiseptic oral rinse (BIOTENE) LIQD 15 mLs by Mouth Rinse route 3 (three) times daily as needed for dry mouth.     Marland Kitchen aspirin EC 81 MG tablet Take 1 tablet (81 mg total) by mouth daily. 90 tablet 3  . cholecalciferol (VITAMIN D) 1000 units tablet Take 1,000 Units by mouth daily.    . citalopram (CELEXA) 20 MG tablet Take 20 mg by mouth daily.    . clopidogrel (PLAVIX) 75 MG tablet Take 1 tablet (75 mg total) by mouth daily.    . famotidine (PEPCID) 20 MG tablet Take 20 mg by mouth daily.    . Febuxostat (ULORIC) 80 MG TABS Take 80 mg by mouth daily.     Marland Kitchen gabapentin (NEURONTIN) 300 MG capsule Take 300 mg by mouth 2 (two) times daily.    . hydrALAZINE (APRESOLINE) 100 MG tablet Take 1 tablet (100 mg total) by mouth 3 (three) times daily. 90 tablet 6  . insulin glargine (LANTUS) 100 UNIT/ML injection Inject 90 Units into the skin daily.    . Liraglutide (VICTOZA) 18 MG/3ML SOPN Inject 1.8 mg into the skin daily.    . Menthol, Topical Analgesic, (BIOFREEZE EX)  Apply 1 application topically 2 (two) times daily as needed (knee/shoulder pain).     Marland Kitchen metolazone (ZAROXOLYN) 2.5 MG tablet Take 1.25 mg by mouth 3 (three) times a week. m-[id number]    . metoprolol succinate (TOPROL-XL) 50 MG 24 hr tablet Take 1 tablet (50 mg total) by mouth daily. Take with or immediately following a meal. 30 tablet 3  . polyethylene glycol  (MIRALAX / GLYCOLAX) packet Take 17 g by mouth daily as needed for moderate constipation.    . potassium chloride SA (KLOR-CON) 20 MEQ tablet Take 80 mEq by mouth 2 (two) times daily.    . rosuvastatin (CRESTOR) 20 MG tablet Take 20 mg by mouth daily.    . sennosides-docusate sodium (SENOKOT-S) 8.6-50 MG tablet Take 2 tablets by mouth 2 (two) times daily.     . Skin Protectants, Misc. (EUCERIN) cream Apply 1 application topically 2 (two) times daily.    Marland Kitchen torsemide (DEMADEX) 20 MG tablet Take 120 mg by mouth daily.     No current facility-administered medications for this visit.   Facility-Administered Medications Ordered in Other Visits  Medication Dose Route Frequency Provider Last Rate Last Admin  . chlorhexidine gluconate (MEDLINE KIT) (PERIDEX) 0.12 % solution 15 mL  15 mL Mouth Rinse BID Chesley Mires, MD      . MEDLINE mouth rinse  15 mL Mouth Rinse QID Chesley Mires, MD        REVIEW OF SYSTEMS:   A 10+ POINT REVIEW OF SYSTEMS WAS OBTAINED including neurology, dermatology, psychiatry, cardiac, respiratory, lymph, extremities, GI, GU, Musculoskeletal, constitutional, breasts, reproductive, HEENT.  All pertinent positives are noted in the HPI.  All others are negative.   PHYSICAL EXAMINATION: ECOG PERFORMANCE STATUS: 3 - Symptomatic, >50% confined to bed  Telehealth visit   LABORATORY DATA:  I have reviewed the data as listed  . CBC Latest Ref Rng & Units 11/05/2019 08/20/2019 05/11/2019  WBC 4.0 - 10.5 K/uL 10.2 10.3 13.8(H)  Hemoglobin 13.0 - 17.0 g/dL 11.1(L) 10.6(L) 9.7(L)  Hematocrit 39.0 - 52.0 % 37.3(L) 35.6(L) 32.3(L)  Platelets 150 - 400 K/uL 173 212 168    . CMP Latest Ref Rng & Units 11/05/2019 10/15/2019 08/20/2019  Glucose 70 - 99 mg/dL 161(H) 233(H) 170(H)  BUN 8 - 23 mg/dL 65(H) 76(H) 72(H)  Creatinine 0.61 - 1.24 mg/dL 2.24(H) 2.39(H) 2.51(H)  Sodium 135 - 145 mmol/L 142 134(L) 136  Potassium 3.5 - 5.1 mmol/L 3.7 4.2 4.0  Chloride 98 - 111 mmol/L 101 96(L) 99  CO2  22 - 32 mmol/L _0 Calcium 8.9 - 10.3 mg/dL 9.1 8.8(L) 9.1  Total Protein 6.5 - 8.1 g/dL 8.0 - -  Total Bilirubin 0.3 - 1.2 mg/dL 0.4 - -  Alkaline Phos 38 - 126 U/L 82 - -  AST 15 - 41 U/L 14(L) - -  ALT 0 - 44 U/L 19 - -   Component     Latest Ref Rng & Units 11/05/2019 11/07/2019  WBC     4.0 - 10.5 K/uL 10.2   RBC     4.22 - 5.81 MIL/uL 4.71   Hemoglobin     13.0 - 17.0 g/dL 11.1 (L)   HCT     39.0 - 52.0 % 37.3 (L)   MCV     80.0 - 100.0 fL 79.2 (L)   MCH     26.0 - 34.0 pg 23.6 (L)   MCHC     30.0 - 36.0 g/dL 29.8 (L)  RDW     11.5 - 15.5 % 18.5 (H)   Platelets     150 - 400 K/uL 173   nRBC     0.0 - 0.2 % 0.0   Neutrophils     % 70   NEUT#     1.7 - 7.7 K/uL 7.3   Lymphocytes     % 16   Lymphocyte #     0.7 - 4.0 K/uL 1.6   Monocytes Relative     % 11   Monocyte #     0.1 - 1.0 K/uL 1.1 (H)   Eosinophil     % 1   Eosinophils Absolute     0.0 - 0.5 K/uL 0.1   Basophil     % 1   Basophils Absolute     0.0 - 0.1 K/uL 0.1   Immature Granulocytes     % 1   Abs Immature Granulocytes     0.00 - 0.07 K/uL 0.05   Sodium     135 - 145 mmol/L 142   Potassium     3.5 - 5.1 mmol/L 3.7   Chloride     98 - 111 mmol/L 101   CO2     22 - 32 mmol/L 28   Glucose     70 - 99 mg/dL 161 (H)   BUN     8 - 23 mg/dL 65 (H)   Creatinine     0.61 - 1.24 mg/dL 2.24 (H)   Calcium     8.9 - 10.3 mg/dL 9.1   Total Protein     6.5 - 8.1 g/dL 8.0   Albumin     3.5 - 5.0 g/dL 3.2 (L)   AST     15 - 41 U/L 14 (L)   ALT     0 - 44 U/L 19   Alkaline Phosphatase     38 - 126 U/L 82   Total Bilirubin     0.3 - 1.2 mg/dL 0.4   GFR, Est Non African American     >60 mL/min 28 (L)   GFR, Est African American     >60 mL/min 33 (L)   Anion gap     5 - 15 13   Total Protein, Urine-UPE24     Not Estab. mg/dL  46.7  Total Protein, Urine-Ur/day     30 - 150 mg/24 hr  514 (H)  ALBUMIN, U     %  57.2  ALPHA 1 URINE     %  1.3  Alpha 2, Urine     %  7.1  %  BETA, Urine     %  15.1  GAMMA GLOBULIN URINE     %  19.3  Free Kappa Lt Chains,Ur     0.63 - 113.79 mg/L  20.13  Free Lambda Lt Chains,Ur     0.47 - 11.77 mg/L  21.16 (H)  Free Kappa/Lambda Ratio     1.03 - 31.76  0.95 (L)  Immunofixation Result, Urine       Comment (A)  Total Volume       1,100  M-SPIKE %, Urine     Not Observed %  5.5 (H)  M-Spike, mg/24 hr     Not Observed mg/24 hr  28 (H)  NOTE:       Comment  IgG (Immunoglobin G), Serum     603 - 1,613 mg/dL 1,940 (H)   IgA     61 -  437 mg/dL 300   IgM (Immunoglobulin M), Srm     15 - 143 mg/dL 137   Total Protein ELP     6.0 - 8.5 g/dL 7.2   Albumin SerPl Elph-Mcnc     2.9 - 4.4 g/dL 3.1   Alpha 1     0.0 - 0.4 g/dL 0.3   Alpha2 Glob SerPl Elph-Mcnc     0.4 - 1.0 g/dL 0.7   B-Globulin SerPl Elph-Mcnc     0.7 - 1.3 g/dL 1.2   Gamma Glob SerPl Elph-Mcnc     0.4 - 1.8 g/dL 1.9 (H)   M Protein SerPl Elph-Mcnc     Not Observed g/dL 1.1 (H)   Globulin, Total     2.2 - 3.9 g/dL 4.1 (H)   Albumin/Glob SerPl     0.7 - 1.7 0.8   IFE 1      Comment (A)   Please Note (HCV):      Comment   Kappa free light chain     3.3 - 19.4 mg/L 79.5 (H)   Lamda free light chains     5.7 - 26.3 mg/L 123.6 (H)   Kappa, lamda light chain ratio     0.26 - 1.65 0.64   Sed Rate     0 - 16 mm/hr 60 (H)   LDH     98 - 192 U/L 275 (H)    24-Hr Ur UPEP/UIFE/Light Chains/TP      The value has a corrected status.      No reference range information available      Resulting Lab: [facility]      Comments:           (NOTE)           Immunofixation shows IgG monoclonal protein with lambda            light chain           specificity.           Bence Jones Protein positive; lambda type.  Multiple Myeloma Panel (SPEP&IFE w/QIG)      The value has a corrected status.      No reference range information available      Resulting Lab: [facility]      Comments:           (NOTE)            Immunofixation shows IgG monoclonal protein with lambda            light chain           Specificity.  RADIOGRAPHIC STUDIES: I have personally reviewed the radiological images as listed and agreed with the findings in the report. DG Bone Survey Met  Result Date: 11/19/2019 CLINICAL DATA:  Metastatic bone survey.  Monoclonal gammopathy. EXAM: METASTATIC BONE SURVEY COMPARISON:  None. FINDINGS: Lateral skull: Negative Right shoulder: No lytic lesion. Left shoulder: No lytic lesion. Ordinary degenerative change. Cervical spine: Loss of normal cervical lordosis. Mid cervical spondylosis. No fracture or lytic change. AP chest: Cardiomegaly. Aortic atherosclerosis. Pulmonary venous hypertension. Lungs are clear. No rib lesion seen. Thoracic spine: No fracture. No lytic lesion. Lumbar spine: No fracture. No lytic lesion. Ordinary degenerative lower lumbar show spondylosis. AP pelvis: Limited detail. No lytic lesion. Right femur: No lytic lesion. Left femur: No lytic lesion. Right tib fib: No lytic lesion. Left tib-fib: No lytic lesion. Right humerus: No lytic lesion. Left humerus: No lytic lesion.  Right forearm: No lytic lesion. Left forearm: No lytic lesion. IMPRESSION: Negative for lytic bone lesions or fractures. Electronically Signed   By: Nelson Chimes M.D.   On: 11/19/2019 16:50   VAS [place] ABI WITH/WO TBI  Result Date: 11/08/2019 LOWER EXTREMITY DOPPLER STUDY Indications: Peripheral artery disease. High Risk Factors: Hypertension, hyperlipidemia, Diabetes, past history of                    smoking, coronary artery disease. Other Factors: Office visit on 10/22/19, physician noted diminished pulses on                exam.                He does not walk much due to severe neuropathy and blindness.  Vascular Interventions: 06/22/13- Left foot transmetatarsal amputation. Comparison Study: In 02/2012, an arterial Doppler showed a right ABI of 1.10 and                   left 1.02. Performing Technologist: Wilkie Aye  RVT  Examination Guidelines: A complete evaluation includes at minimum, Doppler waveform signals and systolic blood pressure reading at the level of bilateral brachial, anterior tibial, and posterior tibial arteries, when vessel segments are accessible. Bilateral testing is considered an integral part of a complete examination. Photoelectric Plethysmograph (PPG) waveforms and toe systolic pressure readings are included as required and additional duplex testing as needed. Limited examinations for reoccurring indications may be performed as noted.  ABI Findings: +---------+------------------+-----+---------+--------+ Right    Rt Pressure (mmHg)IndexWaveform Comment  +---------+------------------+-----+---------+--------+ Brachial 169                                      +---------+------------------+-----+---------+--------+ ATA      178               1.05 triphasic         +---------+------------------+-----+---------+--------+ PTA      170               1.01 triphasic         +---------+------------------+-----+---------+--------+ PERO     180               1.07 triphasic         +---------+------------------+-----+---------+--------+ Great Toe140               0.83 Normal            +---------+------------------+-----+---------+--------+ +---------+------------------+-----+--------+--------------------------+ Left     Lt Pressure (mmHg)IndexWaveformComment                    +---------+------------------+-----+--------+--------------------------+ Brachial 164                                                       +---------+------------------+-----+--------+--------------------------+ ATA      195               1.15                                    +---------+------------------+-----+--------+--------------------------+ PTA      195  1.15                                     +---------+------------------+-----+--------+--------------------------+ PERO     187               1.11                                    +---------+------------------+-----+--------+--------------------------+ Great Toe                               Transmetatarsal amputation +---------+------------------+-----+--------+--------------------------+ +-------+-----------+-----------+------------+------------+ ABI/TBIToday's ABIToday's TBIPrevious ABIPrevious TBI +-------+-----------+-----------+------------+------------+ Right  1.07       0.83       1.10                     +-------+-----------+-----------+------------+------------+ Left   1.15                  1.02                     +-------+-----------+-----------+------------+------------+ Bilateral ABIs appear essentially unchanged compared to prior study on 02/2012.  Summary: Right: Resting right ankle-brachial index is within normal range. No evidence of significant right lower extremity arterial disease. The right toe-brachial index is normal. Left: Resting left ankle-brachial index is within normal range. No evidence of significant left lower extremity arterial disease.  *See table(s) above for measurements and observations.  Electronically signed by Larae Grooms MD on 11/08/2019 at 11:37:11 AM.    Final     ASSESSMENT & PLAN:   74 yo with   1) IgG Lambda monoclonal paraproteinemia 2) Cardiomyopathy - ? HTN, ischemic., DM2 08/31/2019 NM CARDIAC AMYLOID TUMOR ([id number]) revealed "Visual and quantitative assessment (grade 1, H/CL equal 1.2) are Not suggestive of transthyretin amyloidosis." 3) CKD - likely related to HTN, DM2  PLAN: -Discussed pt labwork today, 11/05/19; all values are WNL except for Hgb at 11.1, HCT at 37.3, MCV at 79.2, MCH at 23.6, MCHC at 29.8, RDW at 18.5, Mono Abs at 1.1K, Glucose at 161, BUN at 65, Creatinine at 2.24, Albumin at 3.2,  AST at 14, GFR Est Af Am at 33. -Discussed 11/05/2019  LDH at 275 -Discussed 11/05/2019 Sed Rate at 98 -Discussed 11/05/2019 MMP shows M Protein steady at 1.1 g/dL -Discussed 11/05/2019 K/L light chains is as follows: Kappa free light chain at 79.5, Lamda free light chains at 123.6, K/L light chain ratio at 0.64 -Discussed 11/07/2019 24-Hr UPEP is as follows: all values are WNL except for Total Protein Urine at 514, Free Lambda Lt Chains at 21.16, Free K/L Ratio at 0.95, M-Spike Urine at 5.5, M-Spike Mg/24 Hr at 28 -Discussed 04/05/2021Bone Survey ([id number]) which revealed "Negative for lytic bone lesions or fractures." -Discussed the CRAB criteria: no hypercalcemia, stable renal dysfunction, improved anemia, and bone lesions identified -No lab evidence of overt multiple myelomas. No indication for more aggressive testing at this time.  -Will continue to watch elevated M Protein in case there is a transformation  -Advised pt that cardiac and kidney dysfunction could also be explained by HTN, DM2, and excess weight  -Recommend pt continue to f/u with PCP, Cardiologist, and Nephrologist -Will see back in 4 months with labs    FOLLOW UP: RTC with Dr Irene Limbo  in 4 months, labs 1week prior to clinic visit   The total time spent in the appt was 20  minutes and more than 50% was on counseling and direct patient cares and discussion of all the lab results and answering questions  All of the patient's questions were answered with apparent satisfaction. The patient knows to call the clinic with any problems, questions or concerns.    Sullivan Lone MD [place] AAHIVMS [facility] Hematology/Oncology Physician [facility]  (Office):       [phone] (Work cell):  [phone] (Fax):           [phone]  11/27/2019 4:30 PM  I, Yevette Edwards, am acting as a scribe for Dr. Sullivan Lone.   .I have reviewed the above documentation for accuracy and completeness, and I agree with the above. Brunetta Genera MD

## 2019-12-05 ENCOUNTER — Telehealth: Payer: Self-pay

## 2019-12-05 NOTE — Telephone Encounter (Signed)
Received call about pt's next appt in August.  Scheduling message sent per MD's 04/13 plan  FOLLOW UP: RTC with Dr Irene Limbo in 4 months, labs 1week prior to clinic visit.  Scheduling to call Mendel Ryder at [facility] with appt. date and time.

## 2019-12-17 ENCOUNTER — Telehealth: Payer: Self-pay | Admitting: Physician Assistant

## 2019-12-17 NOTE — Telephone Encounter (Addendum)
I connected by phone with [name] and/or patient's caregiver on 12/17/2019 at 6:50 PM to discuss the potential vaccination through our Homebound vaccination initiative.   Prevaccination Checklist for COVID-19 Vaccines  1.  Are you feeling sick today? no  2.  Have you ever received a dose of a COVID-19 vaccine?  no      If yes, which one? None   3.  Have you ever had an allergic reaction: (This would include a severe reaction [ e.g., anaphylaxis] that required treatment with epinephrine or EpiPen or that caused you to go to the hospital.  It would also include an allergic reaction that occurred within 4 hours that caused hives, swelling, or respiratory distress, including wheezing.) A.  A previous dose of COVID-19 vaccine. no  B.  A vaccine or injectable therapy that contains multiple components, one of which is a COVID-19 vaccine component, but it is not known which component elicited the immediate reaction. no  C.  Are you allergic to polyethylene glycol? no   4.  Have you ever had an allergic reaction to another vaccine (other than COVID-19 vaccine) or an injectable medication? (This would include a severe reaction [ e.g., anaphylaxis] that required treatment with epinephrine or EpiPen or that caused you to go to the hospital.  It would also include an allergic reaction that occurred within 4 hours that caused hives, swelling, or respiratory distress, including wheezing.)  no   5.  Have you ever had a severe allergic reaction (e.g., anaphylaxis) to something other than a component of the COVID-19 vaccine, or any vaccine or injectable medication?  This would include food, pet, venom, environmental, or oral medication allergies.  no   6.  Have you received any vaccine in the last 14 days? no   7.  Have you ever had a positive test for COVID-19 or has a doctor ever told you that you had COVID-19?  yes   8.  Have you received passive antibody therapy (monoclonal antibodies or convalescent  serum) as a treatment for COVID-19? yes   9.  Do you have a weakened immune system caused by something such as HIV infection or cancer or do you take immunosuppressive drugs or therapies?  no   10.  Do you have a bleeding disorder or are you taking a blood thinner? no   11.  Are you pregnant or breast-feeding? no   12.  Do you have dermal fillers? no   __________________   This patient is a 74 y.o. male that meets the FDA criteria to receive homebound vaccination. Patient or parent/caregiver understands they have the option to accept or refuse homebound vaccination.  Patient passed the pre-screening checklist and would like to proceed with homebound vaccination.  Based on questionnaire above, I recommend the patient be observed for 30 minutes given plavix use .  There are no other household members/caregivers who are also interested in receiving the vaccine.    Patient had a monoclonal antibody infusion on 09/24/19 (within the last 90 days). Patient will be eligible for homebound vaccination on the following date: 12/22/19.   I will send the patient's information to our scheduling team who will reach out to schedule the patient and potential caregiver/family members for homebound vaccination.    Angelena Form PA-C 12/17/2019 6:50 PM

## 2020-01-04 ENCOUNTER — Telehealth: Payer: Self-pay | Admitting: Physician Assistant

## 2020-01-04 NOTE — Telephone Encounter (Signed)
I have taken care of this

## 2020-01-04 NOTE — Telephone Encounter (Signed)
   Benita - Education officer, museum from [facility] calling would like to confirm if pt is scheduled for homebound vaccination. She said to confirm please call her or pt's PCP [name]

## 2020-01-16 ENCOUNTER — Encounter (HOSPITAL_COMMUNITY): Payer: Self-pay | Admitting: Cardiology

## 2020-01-16 ENCOUNTER — Other Ambulatory Visit: Payer: Self-pay

## 2020-01-16 ENCOUNTER — Ambulatory Visit (HOSPITAL_COMMUNITY)
Admission: RE | Admit: 2020-01-16 | Discharge: 2020-01-16 | Disposition: A | Discharge status: Home / Self Care | Payer: Medicare (Managed Care) | Source: Ambulatory Visit | Attending: Cardiology | Admitting: Cardiology

## 2020-01-16 VITALS — BP 152/106 | HR 65 | Wt 320.0 lb

## 2020-01-16 DIAGNOSIS — E1122 Type 2 diabetes mellitus with diabetic chronic kidney disease: Secondary | ICD-10-CM | POA: Insufficient documentation

## 2020-01-16 DIAGNOSIS — I5032 Chronic diastolic (congestive) heart failure: Secondary | ICD-10-CM | POA: Diagnosis not present

## 2020-01-16 DIAGNOSIS — I712 Thoracic aortic aneurysm, without rupture: Secondary | ICD-10-CM | POA: Diagnosis not present

## 2020-01-16 DIAGNOSIS — I130 Hypertensive heart and chronic kidney disease with heart failure and stage 1 through stage 4 chronic kidney disease, or unspecified chronic kidney disease: Secondary | ICD-10-CM | POA: Insufficient documentation

## 2020-01-16 DIAGNOSIS — Z89432 Acquired absence of left foot: Secondary | ICD-10-CM | POA: Diagnosis not present

## 2020-01-16 DIAGNOSIS — N183 Chronic kidney disease, stage 3 unspecified: Secondary | ICD-10-CM | POA: Diagnosis not present

## 2020-01-16 DIAGNOSIS — Z9981 Dependence on supplemental oxygen: Secondary | ICD-10-CM | POA: Insufficient documentation

## 2020-01-16 DIAGNOSIS — I480 Paroxysmal atrial fibrillation: Secondary | ICD-10-CM | POA: Diagnosis not present

## 2020-01-16 DIAGNOSIS — Z9989 Dependence on other enabling machines and devices: Secondary | ICD-10-CM | POA: Diagnosis not present

## 2020-01-16 DIAGNOSIS — I2510 Atherosclerotic heart disease of native coronary artery without angina pectoris: Secondary | ICD-10-CM | POA: Insufficient documentation

## 2020-01-16 DIAGNOSIS — H547 Unspecified visual loss: Secondary | ICD-10-CM | POA: Diagnosis not present

## 2020-01-16 DIAGNOSIS — Z8249 Family history of ischemic heart disease and other diseases of the circulatory system: Secondary | ICD-10-CM | POA: Diagnosis not present

## 2020-01-16 DIAGNOSIS — Z79899 Other long term (current) drug therapy: Secondary | ICD-10-CM | POA: Insufficient documentation

## 2020-01-16 DIAGNOSIS — R001 Bradycardia, unspecified: Secondary | ICD-10-CM | POA: Diagnosis not present

## 2020-01-16 DIAGNOSIS — Z794 Long term (current) use of insulin: Secondary | ICD-10-CM | POA: Insufficient documentation

## 2020-01-16 DIAGNOSIS — Z7982 Long term (current) use of aspirin: Secondary | ICD-10-CM | POA: Diagnosis not present

## 2020-01-16 DIAGNOSIS — I4891 Unspecified atrial fibrillation: Secondary | ICD-10-CM | POA: Diagnosis not present

## 2020-01-16 DIAGNOSIS — Z87820 Personal history of traumatic brain injury: Secondary | ICD-10-CM | POA: Insufficient documentation

## 2020-01-16 DIAGNOSIS — G4733 Obstructive sleep apnea (adult) (pediatric): Secondary | ICD-10-CM | POA: Diagnosis not present

## 2020-01-16 DIAGNOSIS — Z599 Problem related to housing and economic circumstances, unspecified: Secondary | ICD-10-CM | POA: Diagnosis not present

## 2020-01-16 DIAGNOSIS — E1142 Type 2 diabetes mellitus with diabetic polyneuropathy: Secondary | ICD-10-CM | POA: Insufficient documentation

## 2020-01-16 DIAGNOSIS — Z87891 Personal history of nicotine dependence: Secondary | ICD-10-CM | POA: Diagnosis not present

## 2020-01-16 DIAGNOSIS — I350 Nonrheumatic aortic (valve) stenosis: Secondary | ICD-10-CM | POA: Insufficient documentation

## 2020-01-16 LAB — BASIC METABOLIC PANEL
Anion gap: 14 (ref 5–15)
BUN: 92 mg/dL — ABNORMAL HIGH (ref 8–23)
CO2: 24 mmol/L (ref 22–32)
Calcium: 8.9 mg/dL (ref 8.9–10.3)
Chloride: 97 mmol/L — ABNORMAL LOW (ref 98–111)
Creatinine, Ser: 3.33 mg/dL — ABNORMAL HIGH (ref 0.61–1.24)
GFR calc Af Amer: 20 mL/min — ABNORMAL LOW (ref >60)
GFR calc non Af Amer: 18 mL/min — ABNORMAL LOW (ref >60)
Glucose, Bld: 188 mg/dL — ABNORMAL HIGH (ref 70–99)
Potassium: 4.1 mmol/L (ref 3.5–5.1)
Sodium: 135 mmol/L (ref 135–145)

## 2020-01-16 MED ORDER — HYDRALAZINE HCL 100 MG PO TABS: 100.0000 mg | ORAL_TABLET | Freq: Three times a day (TID) | ORAL | 5 refills | Status: DC

## 2020-01-16 NOTE — Progress Notes (Signed)
Patient ID: [name], male   DOB: [birth year], 74 y.o.   MRN: [id number]   [facility]  PCP: [name] Cardiology: Dr. Sallyanne Kuster HF Cardiology: Dr. Aundra Dubin Nephrologist: Dr. Merlene Morse Massie is a 74 y.o. 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   He was admitted in 11/18 with acute respiratory failure => intubated.  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  He was treated for acute on chronic diastolic CHF with IV diuresis.    Patient was admitted in 1/20 with acute on chronic diastolic CHF.  He was found to have severe aortic stenosis.  He was diuresed and developed AKI.    In 8/20, he had LHC showing nonobstructive CAD.  He then had TAVR in 9/20.  Last echo in 10/20 showed EF 55-60% with stable TAVR valve (mean gradient 15 mmHg).   PYP scan in 1/21 was not suggestive of TTR amyloidosis.   He saw Dr. Irene Limbo, likely has monoclonal gammopathy of uncertain significance.   He presents today for followup of CHF.  Using home oxygen and CPAP at night.  [facility]  He is now on very high doses of diuretics, torsemide 120 mg bid with metolazone 2.5 mg daily. He walks around the house with a cane or walker, minimal dyspnea in the house.  Dyspnea walking long distances.  He has lost his balance and fallen, but not commonly.  No lightheadedness. No chest pain.   Labs (11/18): hgb 14.8, K 4.6, creatinine 2.34 Labs (08/17/2017): K 3.5 Creatinine 2.16  Labs (11/29/2017): K 3.4 Creatinine 2.52  Labs (6/19): K 3.6, creatinine 2.29 Labs (9/19): K 4.3, creatinine 1.93 Labs (1/20): K 4.4, creatinine 2.2, BUN 72 Labs (9/20): K 3.8, creatinine 2.44 Labs (3/21): myeloma panel with IgG monoclonal protein. K 3.7, creatinine 2.24  ECG (personally reviewed): A-V dual paced  PMH: 1. Chronic diastolic CHF  with RV dysfunction: Echo (2/15) with EF 60%, mild LVH, mild aortic stenosis with mean gradient 18 mmHg, RV mildly dilated with mildly decreased systolic function.  - Echo (10/18): EF 60-65%, moderate aortic stenosis.  - Echo (8/19): EF 60-65%, mild LVH, moderate AS with mean gradient 29 mmHg/AVA 1.1 cm^2, PASP 45, normal RV size with mildly decreased systolic function.  - TEE (1/20): EF 55-60%, moderate LVH, AVA 0.5 cm^2 by planimetry with mean gradient 35 mmHg, AVA 1.0 cm^2 continuity equation.  - RHC (1/20): mean RA 15, PA 68/23 mean 44, mean PCWP 20, CI 3.8, PVR 3 WU - PYP scan (1/21): Not suggestive of TTR cardiac amyloidosis.  2. Aortic stenosis: Suspect severe at this point.  - TEE (1/20): EF 55-60%, moderate LVH, AVA 0.5 cm^2 by planimetry with mean gradient 35 mmHg, AVA 1.0 cm^2 continuity equation.   - TAVR (9/20) with [place] 3 valve.   - Echo (10/20): EF 55-60%, mild RV dilation with normal systolic function, bioprosthetic aortic valve s/p TAVR with mean gradient 15 mmHg.  3. Type II diabetes 4. 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  - 3.8 cm ascending aorta on 8/19 echo.  5. OHS/OSA: Uses CPAP and oxygen during the day.  6. Morbid obesity.  7. HTN 8. CAD: Nonobstructive.  [place] 2005 with 60% LAD stenosis. 9. 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  10. Atrial fibrillation: Paroxysmal.  Possible amiodarone  lung toxicity => now off.  11. CKD stage III 12. Macular degeneration: Legally blind.  13. 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  14. 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 15. S/p left foot amputation 16. Diabetic peripheral neuropathy.  17. H/o atrial tachycardia 18. CAD: LHC (8/20) with 50% mid RCA stenosis.  19. ABIs (3/21): normal.  20. Monoclonal gammopathy of uncertain significance.  - Skeletal survey negative.   Social History   Socioeconomic History  . Marital status: Divorced    Spouse  name: Not on file  . Number of children: 4  . Years of education: 62  . Highest education level: 12th grade  Occupational History  . Occupation: disabled  Tobacco Use  . Smoking status: Former Smoker    Packs/day: 0.00    Years: 0.00    Pack years: 0.00    Types: Cigarettes    Quit date: 08/17/2003    Years since quitting: 16.4  . Smokeless tobacco: Never Used  . Tobacco comment: started at age 54.  only smoked on occ- very rarely.0  Substance and Sexual Activity  . Alcohol use: Yes    Comment: 06/23/2013 "drank some; never had a problem w/it"  . Drug use: No  . Sexual activity: Never  Other Topics Concern  . Not on file  Social History Narrative   12/10- reports issues with housing- states it is not disability friendly but has ramp, first floor apt, and has grab bars/assistive devices provided by [facility].  Reports that he has concerns with having enough food but gets meals on wheels, eats at [facility] 3x a week, and has personal aid who cooks and shops for him.  Reports concerns with transportation but gets [facility] transport for all of his medical appts and his family or an aid helps him with other transportation needs.   Social Determinants of Health   Financial Resource Strain:   . Difficulty of Paying Living Expenses:   Food Insecurity:   . Worried About Charity fundraiser in the Last Year:   . Arboriculturist in the Last Year:   Transportation Needs:   . Film/video editor (Medical):   Marland Kitchen Lack of Transportation (Non-Medical):   Physical Activity:   . Days of Exercise per Week:   . Minutes of Exercise per Session:   Stress:   . Feeling of Stress :   Social Connections:   . Frequency of Communication with Friends and Family:   . Frequency of Social Gatherings with Friends and Family:   . Attends Religious Services:   . Active Member of Clubs or Organizations:   . Attends Archivist Meetings:   Marland Kitchen Marital Status:   Intimate Partner Violence:   . Fear of Current or  Ex-Partner:   . Emotionally Abused:   Marland Kitchen Physically Abused:   . Sexually Abused:     Family History  Problem Relation Age of Onset  . Coronary artery disease Sister   . Heart disease Sister   . Allergies Sister   . Asthma Brother   . Rheum arthritis Brother    Review of systems complete and found to be negative unless listed in HPI.    Current Outpatient Medications  Medication Sig Dispense Refill  . acetaminophen (TYLENOL) 650 MG CR tablet Take 1,300 mg by mouth 2 (two) times daily.     Marland Kitchen amLODipine (NORVASC) 10 MG tablet Take 10 mg by mouth daily. Reported on 12/09/2015    . antiseptic  oral rinse (BIOTENE) LIQD 15 mLs by Mouth Rinse route 3 (three) times daily as needed for dry mouth.     Marland Kitchen aspirin EC 81 MG tablet Take 1 tablet (81 mg total) by mouth daily. 90 tablet 3  . cholecalciferol (VITAMIN D) 1000 units tablet Take 1,000 Units by mouth daily.    . citalopram (CELEXA) 20 MG tablet Take 20 mg by mouth daily.    . famotidine (PEPCID) 20 MG tablet Take 20 mg by mouth daily.    . Febuxostat (ULORIC) 80 MG TABS Take 80 mg by mouth daily.     Marland Kitchen gabapentin (NEURONTIN) 300 MG capsule Take 300 mg by mouth 2 (two) times daily.    . hydrALAZINE (APRESOLINE) 100 MG tablet Take 1 tablet (100 mg total) by mouth 3 (three) times daily. 90 tablet 5  . insulin glargine (LANTUS) 100 UNIT/ML injection Inject 90 Units into the skin daily.    . Liraglutide (VICTOZA) 18 MG/3ML SOPN Inject 1.8 mg into the skin daily.    Marland Kitchen loperamide (IMODIUM A-D) 2 MG tablet Take 1 mg by mouth 3 (three) times daily as needed for diarrhea or loose stools.    . Menthol, Topical Analgesic, (BIOFREEZE EX) Apply 1 application topically 2 (two) times daily as needed (knee/shoulder pain).     Marland Kitchen metolazone (ZAROXOLYN) 2.5 MG tablet Take 2.5 mg by mouth daily.    . metoprolol succinate (TOPROL-XL) 50 MG 24 hr tablet Take 1 tablet (50 mg total) by mouth daily. Take with or immediately following a meal. 30 tablet 3  .  polyethylene glycol (MIRALAX / GLYCOLAX) packet Take 17 g by mouth daily as needed for moderate constipation.    . potassium chloride SA (KLOR-CON) 20 MEQ tablet Take 80 mEq by mouth 2 (two) times daily.    . rosuvastatin (CRESTOR) 20 MG tablet Take 20 mg by mouth daily.    . sennosides-docusate sodium (SENOKOT-S) 8.6-50 MG tablet Take 2 tablets by mouth 2 (two) times daily.     . Skin Protectants, Misc. (EUCERIN) cream Apply 1 application topically 2 (two) times daily.    Marland Kitchen torsemide (DEMADEX) 20 MG tablet Take 120 mg by mouth 2 (two) times daily.     Marland Kitchen amoxicillin (AMOXIL) 500 MG capsule Take 4 capsules by mouth one hour prior to dental work (Patient not taking: Reported on 01/16/2020) 4 capsule 5   No current facility-administered medications for this encounter.   Facility-Administered Medications Ordered in Other Encounters  Medication Dose Route Frequency Provider Last Rate Last Admin  . chlorhexidine gluconate (MEDLINE KIT) (PERIDEX) 0.12 % solution 15 mL  15 mL Mouth Rinse BID Chesley Mires, MD      . MEDLINE mouth rinse  15 mL Mouth Rinse QID Chesley Mires, MD       Vitals:   01/16/20 1129  BP: (!) 152/106  Pulse: 65  SpO2: 99%  Weight: (!) 145.2 kg (320 lb)    Wt Readings from Last 3 Encounters:  01/16/20 (!) 145.2 kg (320 lb)  11/05/19 (!) 150.1 kg (331 lb)  10/15/19 (!) 151.7 kg (334 lb 8 oz)    Physical Exam General: NAD, obese Neck: Thick, no JVD (difficult exam), no thyromegaly or thyroid nodule.  Lungs: Clear to auscultation bilaterally with normal respiratory effort. CV: Nondisplaced PMI.  Heart regular S1/S2, no S3/S4, 1/6 SEM RUSB.  1+ chronic edema to knees.  No carotid bruit.  Unable to palpate pedal pulses.  Abdomen: Soft, nontender, no hepatosplenomegaly, no distention.  Skin: Intact without lesions or rashes.  Neurologic: Alert and oriented x 3.  Psych: Normal affect. Extremities: No clubbing or cyanosis.  HEENT: Normal.   Assessment/Plan:  1. Chronic  diastolic CHF with prominent RV failure: Volume has been difficult to manage in setting of RV failure (OHS/OSA) and CKD stage 3.  PYP scan not suggestive of TTR amyloidosis. He likely has a monoclonal gammopathy of uncertain significance, not AL amyloidosis.  NYHA class Ill symptoms chronically.  He is not significantly volume overloaded on exam today but exam is difficult.  Weight is actually down about 14 lbs.   - Continue torsemide 120 mg BID - Continue metolazone 2.5 mg daily.  - BMET today.   2. Aortic stenosis:  S/p TAVR 9/20.  Valve stable on 10/20 echo with no significant AI and mean gradient 15 mmHg.  - Antibiotic prophylaxis with dental work.  - He is now off Plavix, just on ASA.   3. Atrial fibrillation:  He is a-paced today.  He is not anticoagulated (takes ASA) due to prior intracerebral hemorrhage. 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   - As he is not anticoagulated, would continue rate control.  Cannot do DCCV without anticoagulation.  - Continue Toprol XL 50 mg daily.  4. CAD: Nonobstructive.  No chest pain.  - Continue ASA and statin.   5. 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 CKD: Stage III.   - BMET today.   7. OHS/OSA: Continue nightly CPAP and home oxygen.  8. HTN: Increase hydralazine to 100 mg tid.   Followup in 3 months with NP/PA.   Loralie Champagne, MD  01/16/2020

## 2020-01-16 NOTE — Patient Instructions (Signed)
INCREASE Hydralazine to 100mg  (1 tab) three times a day   Labs today We will only contact you if something comes back abnormal or we need to make some changes. Otherwise no news is good news!   Your physician recommends that you schedule a follow-up appointment in: 3 months with the Nurse Practitioner/Physician Assistant  Please call office at [phone] option 2 if you have any questions or concerns.   At the [facility], you and your health needs are our priority. As part of our continuing mission to provide you with exceptional heart care, we have created designated Provider Care Teams. These Care Teams include your primary Cardiologist (physician) and Advanced Practice Providers (APPs- Physician Assistants and Nurse Practitioners) who all work together to provide you with the care you need, when you need it.   You may see any of the following providers on your designated Care Team at your next follow up: Marland Kitchen Dr Glori Bickers . Dr Loralie Champagne . Darrick Grinder, NP . Lyda Jester, PA . Audry Riles, PharmD   Please be sure to bring in all your medications bottles to every appointment.

## 2020-01-25 ENCOUNTER — Other Ambulatory Visit: Payer: Self-pay | Admitting: Family Medicine

## 2020-01-25 ENCOUNTER — Emergency Department (HOSPITAL_COMMUNITY): Payer: Medicare (Managed Care)

## 2020-01-25 ENCOUNTER — Inpatient Hospital Stay (HOSPITAL_COMMUNITY)
Admission: EM | Admit: 2020-01-25 | Discharge: 2020-03-10 | DRG: 064 | Disposition: A | Discharge status: Skilled Nursing Facility | Payer: Medicare (Managed Care) | Attending: Internal Medicine | Admitting: Internal Medicine

## 2020-01-25 ENCOUNTER — Other Ambulatory Visit: Payer: Self-pay

## 2020-01-25 ENCOUNTER — Encounter (HOSPITAL_COMMUNITY): Payer: Self-pay | Admitting: Emergency Medicine

## 2020-01-25 ENCOUNTER — Ambulatory Visit
Admission: RE | Admit: 2020-01-25 | Discharge: 2020-01-25 | Disposition: A | Discharge status: Home / Self Care | Payer: Medicare (Managed Care) | Source: Ambulatory Visit | Attending: Family Medicine | Admitting: Family Medicine

## 2020-01-25 DIAGNOSIS — R4701 Aphasia: Secondary | ICD-10-CM | POA: Diagnosis present

## 2020-01-25 DIAGNOSIS — R739 Hyperglycemia, unspecified: Secondary | ICD-10-CM | POA: Diagnosis not present

## 2020-01-25 DIAGNOSIS — E662 Morbid (severe) obesity with alveolar hypoventilation: Secondary | ICD-10-CM | POA: Diagnosis present

## 2020-01-25 DIAGNOSIS — R0603 Acute respiratory distress: Secondary | ICD-10-CM | POA: Diagnosis not present

## 2020-01-25 DIAGNOSIS — K8020 Calculus of gallbladder without cholecystitis without obstruction: Secondary | ICD-10-CM | POA: Diagnosis present

## 2020-01-25 DIAGNOSIS — T17890A Other foreign object in other parts of respiratory tract causing asphyxiation, initial encounter: Secondary | ICD-10-CM | POA: Diagnosis not present

## 2020-01-25 DIAGNOSIS — N281 Cyst of kidney, acquired: Secondary | ICD-10-CM | POA: Diagnosis present

## 2020-01-25 DIAGNOSIS — Z7982 Long term (current) use of aspirin: Secondary | ICD-10-CM

## 2020-01-25 DIAGNOSIS — Z8249 Family history of ischemic heart disease and other diseases of the circulatory system: Secondary | ICD-10-CM

## 2020-01-25 DIAGNOSIS — I43 Cardiomyopathy in diseases classified elsewhere: Secondary | ICD-10-CM | POA: Diagnosis present

## 2020-01-25 DIAGNOSIS — Z781 Physical restraint status: Secondary | ICD-10-CM

## 2020-01-25 DIAGNOSIS — M109 Gout, unspecified: Secondary | ICD-10-CM | POA: Diagnosis present

## 2020-01-25 DIAGNOSIS — G9341 Metabolic encephalopathy: Secondary | ICD-10-CM | POA: Diagnosis present

## 2020-01-25 DIAGNOSIS — R1312 Dysphagia, oropharyngeal phase: Secondary | ICD-10-CM | POA: Diagnosis not present

## 2020-01-25 DIAGNOSIS — S06369A Traumatic hemorrhage of cerebrum, unspecified, with loss of consciousness of unspecified duration, initial encounter: Secondary | ICD-10-CM | POA: Diagnosis not present

## 2020-01-25 DIAGNOSIS — G9340 Encephalopathy, unspecified: Secondary | ICD-10-CM

## 2020-01-25 DIAGNOSIS — G44209 Tension-type headache, unspecified, not intractable: Secondary | ICD-10-CM

## 2020-01-25 DIAGNOSIS — I2510 Atherosclerotic heart disease of native coronary artery without angina pectoris: Secondary | ICD-10-CM | POA: Diagnosis present

## 2020-01-25 DIAGNOSIS — J690 Pneumonitis due to inhalation of food and vomit: Secondary | ICD-10-CM | POA: Diagnosis not present

## 2020-01-25 DIAGNOSIS — Z950 Presence of cardiac pacemaker: Secondary | ICD-10-CM | POA: Diagnosis not present

## 2020-01-25 DIAGNOSIS — Z9911 Dependence on respirator [ventilator] status: Secondary | ICD-10-CM

## 2020-01-25 DIAGNOSIS — N179 Acute kidney failure, unspecified: Secondary | ICD-10-CM | POA: Diagnosis present

## 2020-01-25 DIAGNOSIS — I480 Paroxysmal atrial fibrillation: Secondary | ICD-10-CM | POA: Diagnosis present

## 2020-01-25 DIAGNOSIS — Z931 Gastrostomy status: Secondary | ICD-10-CM

## 2020-01-25 DIAGNOSIS — Z6841 Body Mass Index (BMI) 40.0 and over, adult: Secondary | ICD-10-CM | POA: Diagnosis not present

## 2020-01-25 DIAGNOSIS — Z87891 Personal history of nicotine dependence: Secondary | ICD-10-CM

## 2020-01-25 DIAGNOSIS — W19XXXA Unspecified fall, initial encounter: Secondary | ICD-10-CM | POA: Diagnosis present

## 2020-01-25 DIAGNOSIS — J449 Chronic obstructive pulmonary disease, unspecified: Secondary | ICD-10-CM | POA: Diagnosis present

## 2020-01-25 DIAGNOSIS — Z79899 Other long term (current) drug therapy: Secondary | ICD-10-CM

## 2020-01-25 DIAGNOSIS — Z9981 Dependence on supplemental oxygen: Secondary | ICD-10-CM

## 2020-01-25 DIAGNOSIS — H5704 Mydriasis: Secondary | ICD-10-CM | POA: Diagnosis present

## 2020-01-25 DIAGNOSIS — E1122 Type 2 diabetes mellitus with diabetic chronic kidney disease: Secondary | ICD-10-CM | POA: Diagnosis present

## 2020-01-25 DIAGNOSIS — R55 Syncope and collapse: Secondary | ICD-10-CM

## 2020-01-25 DIAGNOSIS — E1165 Type 2 diabetes mellitus with hyperglycemia: Secondary | ICD-10-CM | POA: Diagnosis present

## 2020-01-25 DIAGNOSIS — Z7189 Other specified counseling: Secondary | ICD-10-CM | POA: Diagnosis not present

## 2020-01-25 DIAGNOSIS — J9612 Chronic respiratory failure with hypercapnia: Secondary | ICD-10-CM

## 2020-01-25 DIAGNOSIS — Z89432 Acquired absence of left foot: Secondary | ICD-10-CM

## 2020-01-25 DIAGNOSIS — B009 Herpesviral infection, unspecified: Secondary | ICD-10-CM | POA: Diagnosis present

## 2020-01-25 DIAGNOSIS — Z8673 Personal history of transient ischemic attack (TIA), and cerebral infarction without residual deficits: Secondary | ICD-10-CM

## 2020-01-25 DIAGNOSIS — Z515 Encounter for palliative care: Secondary | ICD-10-CM | POA: Diagnosis not present

## 2020-01-25 DIAGNOSIS — I130 Hypertensive heart and chronic kidney disease with heart failure and stage 1 through stage 4 chronic kidney disease, or unspecified chronic kidney disease: Secondary | ICD-10-CM | POA: Diagnosis present

## 2020-01-25 DIAGNOSIS — I161 Hypertensive emergency: Secondary | ICD-10-CM | POA: Diagnosis present

## 2020-01-25 DIAGNOSIS — E1142 Type 2 diabetes mellitus with diabetic polyneuropathy: Secondary | ICD-10-CM | POA: Diagnosis present

## 2020-01-25 DIAGNOSIS — E119 Type 2 diabetes mellitus without complications: Secondary | ICD-10-CM | POA: Diagnosis not present

## 2020-01-25 DIAGNOSIS — E11649 Type 2 diabetes mellitus with hypoglycemia without coma: Secondary | ICD-10-CM | POA: Diagnosis not present

## 2020-01-25 DIAGNOSIS — E8770 Fluid overload, unspecified: Secondary | ICD-10-CM | POA: Diagnosis not present

## 2020-01-25 DIAGNOSIS — E870 Hyperosmolality and hypernatremia: Secondary | ICD-10-CM | POA: Diagnosis not present

## 2020-01-25 DIAGNOSIS — I619 Nontraumatic intracerebral hemorrhage, unspecified: Secondary | ICD-10-CM | POA: Diagnosis present

## 2020-01-25 DIAGNOSIS — R7989 Other specified abnormal findings of blood chemistry: Secondary | ICD-10-CM | POA: Diagnosis not present

## 2020-01-25 DIAGNOSIS — I629 Nontraumatic intracranial hemorrhage, unspecified: Secondary | ICD-10-CM | POA: Diagnosis not present

## 2020-01-25 DIAGNOSIS — F329 Major depressive disorder, single episode, unspecified: Secondary | ICD-10-CM | POA: Diagnosis present

## 2020-01-25 DIAGNOSIS — Z0189 Encounter for other specified special examinations: Secondary | ICD-10-CM

## 2020-01-25 DIAGNOSIS — R0602 Shortness of breath: Secondary | ICD-10-CM

## 2020-01-25 DIAGNOSIS — Z713 Dietary counseling and surveillance: Secondary | ICD-10-CM

## 2020-01-25 DIAGNOSIS — R0902 Hypoxemia: Secondary | ICD-10-CM

## 2020-01-25 DIAGNOSIS — J9622 Acute and chronic respiratory failure with hypercapnia: Secondary | ICD-10-CM | POA: Diagnosis not present

## 2020-01-25 DIAGNOSIS — E871 Hypo-osmolality and hyponatremia: Secondary | ICD-10-CM | POA: Diagnosis present

## 2020-01-25 DIAGNOSIS — E785 Hyperlipidemia, unspecified: Secondary | ICD-10-CM | POA: Diagnosis present

## 2020-01-25 DIAGNOSIS — I609 Nontraumatic subarachnoid hemorrhage, unspecified: Secondary | ICD-10-CM | POA: Diagnosis present

## 2020-01-25 DIAGNOSIS — Z952 Presence of prosthetic heart valve: Secondary | ICD-10-CM

## 2020-01-25 DIAGNOSIS — I5033 Acute on chronic diastolic (congestive) heart failure: Secondary | ICD-10-CM | POA: Diagnosis present

## 2020-01-25 DIAGNOSIS — I350 Nonrheumatic aortic (valve) stenosis: Secondary | ICD-10-CM | POA: Diagnosis present

## 2020-01-25 DIAGNOSIS — Z20822 Contact with and (suspected) exposure to covid-19: Secondary | ICD-10-CM | POA: Diagnosis present

## 2020-01-25 DIAGNOSIS — J9611 Chronic respiratory failure with hypoxia: Secondary | ICD-10-CM

## 2020-01-25 DIAGNOSIS — Z7289 Other problems related to lifestyle: Secondary | ICD-10-CM

## 2020-01-25 DIAGNOSIS — Z4659 Encounter for fitting and adjustment of other gastrointestinal appliance and device: Secondary | ICD-10-CM

## 2020-01-25 DIAGNOSIS — N184 Chronic kidney disease, stage 4 (severe): Secondary | ICD-10-CM | POA: Diagnosis present

## 2020-01-25 DIAGNOSIS — S06360A Traumatic hemorrhage of cerebrum, unspecified, without loss of consciousness, initial encounter: Secondary | ICD-10-CM | POA: Diagnosis not present

## 2020-01-25 DIAGNOSIS — Z794 Long term (current) use of insulin: Secondary | ICD-10-CM

## 2020-01-25 DIAGNOSIS — I129 Hypertensive chronic kidney disease with stage 1 through stage 4 chronic kidney disease, or unspecified chronic kidney disease: Secondary | ICD-10-CM | POA: Diagnosis not present

## 2020-01-25 DIAGNOSIS — R509 Fever, unspecified: Secondary | ICD-10-CM | POA: Diagnosis not present

## 2020-01-25 DIAGNOSIS — N400 Enlarged prostate without lower urinary tract symptoms: Secondary | ICD-10-CM | POA: Diagnosis present

## 2020-01-25 DIAGNOSIS — E872 Acidosis: Secondary | ICD-10-CM | POA: Diagnosis not present

## 2020-01-25 DIAGNOSIS — K219 Gastro-esophageal reflux disease without esophagitis: Secondary | ICD-10-CM | POA: Diagnosis present

## 2020-01-25 DIAGNOSIS — R062 Wheezing: Secondary | ICD-10-CM

## 2020-01-25 DIAGNOSIS — H548 Legal blindness, as defined in USA: Secondary | ICD-10-CM | POA: Diagnosis present

## 2020-01-25 DIAGNOSIS — R451 Restlessness and agitation: Secondary | ICD-10-CM | POA: Diagnosis not present

## 2020-01-25 DIAGNOSIS — E875 Hyperkalemia: Secondary | ICD-10-CM | POA: Diagnosis not present

## 2020-01-25 DIAGNOSIS — I615 Nontraumatic intracerebral hemorrhage, intraventricular: Principal | ICD-10-CM

## 2020-01-25 DIAGNOSIS — J9621 Acute and chronic respiratory failure with hypoxia: Secondary | ICD-10-CM

## 2020-01-25 DIAGNOSIS — Z978 Presence of other specified devices: Secondary | ICD-10-CM

## 2020-01-25 DIAGNOSIS — I495 Sick sinus syndrome: Secondary | ICD-10-CM | POA: Diagnosis present

## 2020-01-25 DIAGNOSIS — E876 Hypokalemia: Secondary | ICD-10-CM | POA: Diagnosis not present

## 2020-01-25 DIAGNOSIS — I482 Chronic atrial fibrillation, unspecified: Secondary | ICD-10-CM | POA: Diagnosis not present

## 2020-01-25 DIAGNOSIS — I6389 Other cerebral infarction: Secondary | ICD-10-CM | POA: Diagnosis not present

## 2020-01-25 DIAGNOSIS — E1151 Type 2 diabetes mellitus with diabetic peripheral angiopathy without gangrene: Secondary | ICD-10-CM | POA: Diagnosis present

## 2020-01-25 DIAGNOSIS — R1310 Dysphagia, unspecified: Secondary | ICD-10-CM | POA: Diagnosis present

## 2020-01-25 DIAGNOSIS — T17908A Unspecified foreign body in respiratory tract, part unspecified causing other injury, initial encounter: Secondary | ICD-10-CM

## 2020-01-25 DIAGNOSIS — G4733 Obstructive sleep apnea (adult) (pediatric): Secondary | ICD-10-CM | POA: Diagnosis present

## 2020-01-25 DIAGNOSIS — K828 Other specified diseases of gallbladder: Secondary | ICD-10-CM | POA: Diagnosis present

## 2020-01-25 DIAGNOSIS — J9600 Acute respiratory failure, unspecified whether with hypoxia or hypercapnia: Secondary | ICD-10-CM

## 2020-01-25 DIAGNOSIS — Z8679 Personal history of other diseases of the circulatory system: Secondary | ICD-10-CM | POA: Diagnosis not present

## 2020-01-25 LAB — COMPREHENSIVE METABOLIC PANEL
ALT: 131 U/L — ABNORMAL HIGH (ref 0–44)
AST: 89 U/L — ABNORMAL HIGH (ref 15–41)
Albumin: 3.3 g/dL — ABNORMAL LOW (ref 3.5–5.0)
Alkaline Phosphatase: 143 U/L — ABNORMAL HIGH (ref 38–126)
Anion gap: 12 (ref 5–15)
BUN: 80 mg/dL — ABNORMAL HIGH (ref 8–23)
CO2: 27 mmol/L (ref 22–32)
Calcium: 9.1 mg/dL (ref 8.9–10.3)
Chloride: 96 mmol/L — ABNORMAL LOW (ref 98–111)
Creatinine, Ser: 2.51 mg/dL — ABNORMAL HIGH (ref 0.61–1.24)
GFR calc Af Amer: 29 mL/min — ABNORMAL LOW (ref >60)
GFR calc non Af Amer: 25 mL/min — ABNORMAL LOW (ref >60)
Glucose, Bld: 150 mg/dL — ABNORMAL HIGH (ref 70–99)
Potassium: 4.8 mmol/L (ref 3.5–5.1)
Sodium: 135 mmol/L (ref 135–145)
Total Bilirubin: 1.5 mg/dL — ABNORMAL HIGH (ref 0.3–1.2)
Total Protein: 7.4 g/dL (ref 6.5–8.1)

## 2020-01-25 LAB — CBC
HCT: 40.9 % (ref 39.0–52.0)
Hemoglobin: 12.2 g/dL — ABNORMAL LOW (ref 13.0–17.0)
MCH: 23.9 pg — ABNORMAL LOW (ref 26.0–34.0)
MCHC: 29.8 g/dL — ABNORMAL LOW (ref 30.0–36.0)
MCV: 80.2 fL (ref 80.0–100.0)
Platelets: 219 10*3/uL (ref 150–400)
RBC: 5.1 MIL/uL (ref 4.22–5.81)
RDW: 15.9 % — ABNORMAL HIGH (ref 11.5–15.5)
WBC: 13.2 10*3/uL — ABNORMAL HIGH (ref 4.0–10.5)
nRBC: 0 % (ref 0.0–0.2)

## 2020-01-25 LAB — I-STAT ARTERIAL BLOOD GAS, ED
Acid-Base Excess: 9 mmol/L — ABNORMAL HIGH (ref 0.0–2.0)
Bicarbonate: 34.4 mmol/L — ABNORMAL HIGH (ref 20.0–28.0)
Calcium, Ion: 1.2 mmol/L (ref 1.15–1.40)
HCT: 39 % (ref 39.0–52.0)
Hemoglobin: 13.3 g/dL (ref 13.0–17.0)
O2 Saturation: 95 %
Patient temperature: 98.8
Potassium: 3.4 mmol/L — ABNORMAL LOW (ref 3.5–5.1)
Sodium: 139 mmol/L (ref 135–145)
TCO2: 36 mmol/L — ABNORMAL HIGH (ref 22–32)
pCO2 arterial: 47.3 mmHg (ref 32.0–48.0)
pH, Arterial: 7.47 — ABNORMAL HIGH (ref 7.350–7.450)
pO2, Arterial: 71 mmHg — ABNORMAL LOW (ref 83.0–108.0)

## 2020-01-25 LAB — CBG MONITORING, ED: Glucose-Capillary: 165 mg/dL — ABNORMAL HIGH (ref 70–99)

## 2020-01-25 LAB — TROPONIN I (HIGH SENSITIVITY): Troponin I (High Sensitivity): 30 ng/L — ABNORMAL HIGH (ref <18)

## 2020-01-25 LAB — SARS CORONAVIRUS 2 BY RT PCR (HOSPITAL ORDER, PERFORMED IN ~~LOC~~ HOSPITAL LAB): SARS Coronavirus 2: NEGATIVE

## 2020-01-25 LAB — BRAIN NATRIURETIC PEPTIDE: B Natriuretic Peptide: 61.1 pg/mL (ref 0.0–100.0)

## 2020-01-25 LAB — AMMONIA: Ammonia: 42 umol/L — ABNORMAL HIGH (ref 9–35)

## 2020-01-25 MED ORDER — SODIUM CHLORIDE 0.9% FLUSH: 3.0000 mL | Freq: Once | INTRAVENOUS | Status: DC

## 2020-01-25 MED ORDER — AMLODIPINE BESYLATE 10 MG PO TABS: 10.0000 mg | ORAL_TABLET | Freq: Every morning | ORAL | Status: DC

## 2020-01-25 MED ORDER — TRAMADOL HCL 50 MG PO TABS
50.0000 mg | ORAL_TABLET | Freq: Once | ORAL | Status: AC
  Administered 2020-01-25: 50 mg via ORAL
  Filled 2020-01-25: qty 1

## 2020-01-25 MED ORDER — CLEVIDIPINE BUTYRATE 0.5 MG/ML IV EMUL
0.0000 mg/h | INTRAVENOUS | Status: DC
  Administered 2020-01-25: 1 mg/h via INTRAVENOUS
  Filled 2020-01-25: qty 50

## 2020-01-25 MED ORDER — INSULIN ASPART 100 UNIT/ML ~~LOC~~ SOLN
0.0000 [IU] | Freq: Three times a day (TID) | SUBCUTANEOUS | Status: DC
  Administered 2020-01-26 – 2020-01-27 (×5): 5 [IU] via SUBCUTANEOUS
  Administered 2020-01-27 – 2020-01-28 (×2): 3 [IU] via SUBCUTANEOUS
  Administered 2020-01-28 (×2): 5 [IU] via SUBCUTANEOUS

## 2020-01-25 MED ORDER — LABETALOL HCL 5 MG/ML IV SOLN: 20.0000 mg | INTRAVENOUS | Status: DC | PRN

## 2020-01-25 MED ORDER — INSULIN GLARGINE 100 UNIT/ML ~~LOC~~ SOLN
70.0000 [IU] | Freq: Every day | SUBCUTANEOUS | Status: DC
  Administered 2020-01-26: 70 [IU] via SUBCUTANEOUS
  Filled 2020-01-25 (×2): qty 0.7

## 2020-01-25 MED ORDER — POTASSIUM CHLORIDE CRYS ER 20 MEQ PO TBCR
30.0000 meq | EXTENDED_RELEASE_TABLET | Freq: Once | ORAL | Status: AC
  Administered 2020-01-25: 30 meq via ORAL
  Filled 2020-01-25: qty 2

## 2020-01-25 MED ORDER — LABETALOL HCL 5 MG/ML IV SOLN: 10.0000 mg | INTRAVENOUS | Status: DC | PRN

## 2020-01-25 MED ORDER — TORSEMIDE 20 MG PO TABS
100.0000 mg | ORAL_TABLET | Freq: Two times a day (BID) | ORAL | Status: DC
  Administered 2020-01-26 (×2): 100 mg via ORAL
  Filled 2020-01-25 (×2): qty 5

## 2020-01-25 MED ORDER — LABETALOL HCL 5 MG/ML IV SOLN
10.0000 mg | INTRAVENOUS | Status: DC | PRN
  Administered 2020-01-26: 10 mg via INTRAVENOUS
  Filled 2020-01-25: qty 4

## 2020-01-25 MED ORDER — HYDRALAZINE HCL 50 MG PO TABS: 100.0000 mg | ORAL_TABLET | Freq: Three times a day (TID) | ORAL | Status: DC

## 2020-01-25 MED ORDER — INSULIN GLARGINE 100 UNIT/ML SOLOSTAR PEN: 70.0000 [IU] | PEN_INJECTOR | Freq: Every day | SUBCUTANEOUS | Status: DC

## 2020-01-25 NOTE — ED Provider Notes (Signed)
[facility] EMERGENCY DEPARTMENT Provider Note   CSN: [id number] Arrival date & time: 01/25/20  1303     History CC: Syncope, confusion, headache  [name] is a 74 y.o. male.  HPI   Patient has history of multiple medical problems.  Patient has been having issues with headache over the last few days.  Patient was sent to get an outpatient head CT.  Currently the imaging center the patient had a syncopal episode and urinated on himself.  He was confused afterwards.  No report of seizure activity.  In the ED patient does seem to be slightly confused still although is answering my questions.  Denies any headache now.  No fevers.  No chest pain.  No abdominal pain.  Past Medical History:  Diagnosis Date  . Arthritis    "all over" (06/23/2013)  . Atrial fibrillation ([place])    not Candidate for Coumadin d/t rectal bleeding  . Benign prostatic hypertrophy   . Cardiomyopathy, hypertensive ([place])     diastolic dysfunction by 1448 echo  . Coronary artery disease     nonobstructive. 2 vessel. cardiac catheter in March 2005 showing 60% LAD occlusion, 30% circumflex occlusion.  . Degenerative joint disease     Right knee.  . Diabetes type 2, uncontrolled ([place])   . Diabetic peripheral neuropathy ([place])   . Diabetic ulcer of thigh ([place])    H/O Rt thigh ulcer.  . Exertional shortness of breath    "comes and goes" (06/23/2013)  . GERD (gastroesophageal reflux disease)   . Gout   . Herpes   . Hyperlipidemia   . Hypertension   . Hypotension 09/05/2013  . Incarcerated ventral hernia    H/O. S/P repair ventral hernia repair 07/2005.  Marland Kitchen LBBB (left bundle branch block)   . Legally blind   . Morbid obesity ([place])   . Obstructive sleep apnea    On CPAP. Last sleep study (06/2009) - Severe obstructive sleep apnea/hypopnea syndrome, apnea-hypopnea index 70.5 per hour with non positional events moderately loud snoring and oxygen desaturation to a nadir of 85% on room air.  .  Occult blood positive stool     History of in August 2007.  colonoscopy in January 2008 showing normal colon with fair inadequate prep. By Dr. Silvano Rusk.  . 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 Sepsis ([place]) 08/26/2013    Patient Active Problem List   Diagnosis Date Noted  . Acute diastolic CHF (congestive heart failure) ([place]) 04/28/2019  . Acute on chronic congestive heart failure ([place])   . Heart failure ([place]) 08/31/2018  . Paroxysmal atrial tachycardia ([place]) 05/12/2018  . Aortic valve stenosis, nonrheumatic 05/12/2018  . Diabetes mellitus type 2 in obese ([place]) 05/12/2018  . Chronic diastolic heart failure ([place]) 11/11/2017  . Hypernatremia 08/15/2017  . AKI (acute kidney injury) ([place]) 08/15/2017  . Acute lower UTI 08/15/2017  . Coarse tremors 08/15/2017  . Amiodarone pulmonary toxicity   . Cellulitis   . Acute encephalopathy   . Acute pulmonary edema (H[facility])   . Acute respiratory failure with hypoxia ([place]) 06/13/2017  . SSS (sick sinus syndrome) ([place]) 05/19/2016  . Cough 08/20/2015  . Morbid obesity ([place]) 07/09/2015  . Cardiac mass 10/04/2013  . CVA (cerebrovascular accident due to intracerebral hemorrhage) ([place]) 10/02/2013  . CVA (cerebral infarction) 10/01/2013  . History of intracranial hemorrhage 10/01/2013  . ICH (intracerebral hemorrhage) ([place]) 10/01/2013  . Right knee pain 09/07/2013  . Stage 3b chronic kidney disease 09/07/2013  .  Orchitis 09/05/2013  . Unspecified protein-calorie malnutrition ([place]) 09/05/2013  . Sepsis ([place]) 08/27/2013  . Trichomonas infection 08/27/2013  . Epididymo-orchitis, acute 08/27/2013  . PAF (paroxysmal atrial fibrillation) ([place]) 06/24/2013  . OSA (obstructive sleep apnea) 05/04/2011  . Shortness of breath 03/17/2011  . Depression 10/23/2010  . Degenerative joint disease   . Cardiomyopathy, hypertensive ([place])   . Coronary artery disease involving native coronary artery of native heart without angina pectoris   . Pacemaker     . Incarcerated ventral hernia   . COPD (chronic obstructive pulmonary disease) ([place]) 11/28/2009  . Diabetes mellitus due to underlying condition, controlled, with stage 3 chronic kidney disease, with long-term current use of insulin ([place]) 07/20/2006  . Hypercholesterolemia 07/20/2006  . GOUT 07/20/2006  . Macular degeneration (senile) of retina 07/20/2006  . Essential hypertension 07/20/2006  . BENIGN PROSTATIC HYPERTROPHY, HX OF 07/20/2006    Past Surgical History:  Procedure Laterality Date  . AMPUTATION Left 06/22/2013   Procedure: AMPUTATION FOOT;  Surgeon: Newt Minion, MD;  Location: [facility];  Service: Orthopedics;  Laterality: Left;  Left Midfoot Amputation  . CARDIAC CATHETERIZATION  10/22/2003   Dilatation of the LV with preserved LV systolic function. Tortous aorta, but no AAA. Moderate disease with 50-60% area of narrowing in the circumflex after the second OM and 70% mid narrowing in the LAD. No intervention.  Marland Kitchen CARDIOVASCULAR STRESS TEST  01/23/2010   Perfusion defect seen in inferior myocardial region is consistent with diaphragmatic attenuation. Remaining myocardium demonstrates normal myocardial perfusion with no evidence of ischemia or infarct. Stress ECG was non-diagnostic due to LBBB.  Marland Kitchen CATARACT EXTRACTION, BILATERAL Bilateral   . ESOPHAGOGASTRODUODENOSCOPY N/A 10/12/2013   Procedure: ESOPHAGOGASTRODUODENOSCOPY (EGD);  Surgeon: Gwenyth Ober, MD;  Location: [facility];  Service: General;  Laterality: N/A;  bedside  . EYE SURGERY    . 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 HERNIA REPAIR    . INSERT / REPLACE / REMOVE PACEMAKER  11/28/2003   Secondary to symptomatic bradycardia. DDDR pacemaker. By Dr. Rollene Fare.  Randolm Idol / REPLACE / REMOVE PACEMAKER  2012/11/27   "old pacemaker died; had new one put in" (2013-07-15)  . Pacemaker lead revision  12/2003    likely microdislodgment  of right ventricular lead  . PEG PLACEMENT N/A 10/12/2013    Procedure: PERCUTANEOUS ENDOSCOPIC GASTROSTOMY (PEG) PLACEMENT;  Surgeon: Gwenyth Ober, MD;  Location: [facility];  Service: General;  Laterality: N/A;  . PERMANENT PACEMAKER GENERATOR CHANGE N/A 11/03/2012   Procedure: PERMANENT PACEMAKER GENERATOR CHANGE;  Surgeon: Sanda Klein, MD;  Location: [facility] CATH LAB;  Service: Cardiovascular;  Laterality: N/A;  . REFRACTIVE SURGERY Bilateral   . RIGHT HEART CATH N/A 09/05/2018   Procedure: RIGHT HEART CATH;  Surgeon: Larey Dresser, MD;  Location: [facility] CV LAB;  Service: Cardiovascular;  Laterality: N/A;  . RIGHT/LEFT HEART CATH AND CORONARY ANGIOGRAPHY N/A 04/10/2019   Procedure: RIGHT/LEFT HEART CATH AND CORONARY ANGIOGRAPHY;  Surgeon: [name], MD;  Location: [facility] CV LAB;  Service: Cardiovascular;  Laterality: N/A;  . TEE WITHOUT CARDIOVERSION N/A 09/05/2018   Procedure: TRANSESOPHAGEAL ECHOCARDIOGRAM (TEE);  Surgeon: Larey Dresser, MD;  Location: [facility] ENDOSCOPY;  Service: Cardiovascular;  Laterality: N/A;  . TEE WITHOUT CARDIOVERSION N/A 05/08/2019   Procedure: TRANSESOPHAGEAL ECHOCARDIOGRAM (TEE);  Surgeon: [name], MD;  Location: [facility] CV LAB;  Service: Open Heart Surgery;  Laterality: N/A;  . TRANSCATHETER AORTIC VALVE REPLACEMENT, TRANSFEMORAL N/A 05/08/2019  Procedure: TRANSCATHETER AORTIC VALVE REPLACEMENT, TRANSFEMORAL;  Surgeon: [name], MD;  Location: [facility] CV LAB;  Service: Open Heart Surgery;  Laterality: N/A;  . TRANSTHORACIC ECHOCARDIOGRAM  01/01/2013   EF 50-55%, ventricular septum-thickness moderately increased  . VENOUS DOPPLER  01/17/2013   No evidence of thrombus or thrombophlebitis. Left GSVs appear enlarged and demonstrate valvular insuffiiciency. Bilateral SSVs appeared patent with no valvular insufficiency seen.  Marland Kitchen VENTRAL HERNIA REPAIR  07/2005    S/P laparotomy, lysis of adhesions, repair of incarcerated ventral hernia with  partial omentectomy.. Performed by Dr. Marlou Starks.        Family History  Problem Relation Age of Onset  . Coronary artery disease Sister   . Heart disease Sister   . Allergies Sister   . Asthma Brother   . Rheum arthritis Brother     Social History   Tobacco Use  . Smoking status: Former Smoker    Packs/day: 0.00    Years: 0.00    Pack years: 0.00    Types: Cigarettes    Quit date: 08/17/2003    Years since quitting: 16.4  . Smokeless tobacco: Never Used  . Tobacco comment: started at age 54.  only smoked on occ- very rarely.0  Vaping Use  . Vaping Use: Never used  Substance Use Topics  . Alcohol use: Yes    Comment: 06/23/2013 "drank some; never had a problem w/it"  . Drug use: No    Home Medications Prior to Admission medications   Medication Sig Start Date End Date Taking? Authorizing Provider  acetaminophen (TYLENOL) 650 MG CR tablet Take 1,300 mg by mouth 2 (two) times daily.     [provider]  amLODipine (NORVASC) 10 MG tablet Take 10 mg by mouth daily. Reported on 12/09/2015    [provider]  amoxicillin (AMOXIL) 500 MG capsule Take 4 capsules by mouth one hour prior to dental work Patient not taking: Reported on 01/16/2020 06/07/19   Eileen Stanford, PA-C  antiseptic oral rinse (BIOTENE) LIQD 15 mLs by Mouth Rinse route 3 (three) times daily as needed for dry mouth.     [provider]  aspirin EC 81 MG tablet Take 1 tablet (81 mg total) by mouth daily. 01/19/19   Clegg, Amy D, NP  cholecalciferol (VITAMIN D) 1000 units tablet Take 1,000 Units by mouth daily.    [provider]  citalopram (CELEXA) 20 MG tablet Take 20 mg by mouth daily.    [provider]  famotidine (PEPCID) 20 MG tablet Take 20 mg by mouth daily.    [provider]  Febuxostat (ULORIC) 80 MG TABS Take 80 mg by mouth daily.     [provider]  gabapentin (NEURONTIN) 300 MG capsule Take 300 mg by mouth 2 (two) times daily.    [provider]  hydrALAZINE (APRESOLINE) 100  MG tablet Take 1 tablet (100 mg total) by mouth 3 (three) times daily. 01/16/20   Larey Dresser, MD  insulin glargine (LANTUS) 100 UNIT/ML injection Inject 90 Units into the skin daily.    [provider]  Liraglutide (VICTOZA) 18 MG/3ML SOPN Inject 1.8 mg into the skin daily.    [provider]  loperamide (IMODIUM A-D) 2 MG tablet Take 1 mg by mouth 3 (three) times daily as needed for diarrhea or loose stools.    [provider]  Menthol, Topical Analgesic, (BIOFREEZE EX) Apply 1 application topically 2 (two) times daily as needed (knee/shoulder pain).  [provider]  metolazone (ZAROXOLYN) 2.5 MG tablet Take 2.5 mg by mouth daily.    [provider]  metoprolol succinate (TOPROL-XL) 50 MG 24 hr tablet Take 1 tablet (50 mg total) by mouth daily. Take with or immediately following a meal. 03/21/18   Larey Dresser, MD  polyethylene glycol [facility] / Floria Raveling) packet Take 17 g by mouth daily as needed for moderate constipation.    [provider]  potassium chloride SA (KLOR-CON) 20 MEQ tablet Take 80 mEq by mouth 2 (two) times daily.    [provider]  rosuvastatin (CRESTOR) 20 MG tablet Take 20 mg by mouth daily.    [provider]  sennosides-docusate sodium (SENOKOT-S) 8.6-50 MG tablet Take 2 tablets by mouth 2 (two) times daily.     [provider]  Skin Protectants, Misc. (EUCERIN) cream Apply 1 application topically 2 (two) times daily.    [provider]  torsemide (DEMADEX) 20 MG tablet Take 120 mg by mouth 2 (two) times daily.     [provider]    Allergies    Patient has no known allergies.  Review of Systems   Review of Systems  All other systems reviewed and are negative.   Physical Exam Updated Vital Signs BP (!) 144/66 (BP Location: Right Arm)   Pulse 66   Temp 98.8 F (37.1 C) (Oral)   Resp 15   SpO2 93%   Physical Exam Vitals and nursing note reviewed.   Constitutional:      Appearance: He is well-developed. He is obese. He is not diaphoretic.  HENT:     Head: Normocephalic and atraumatic.     Right Ear: External ear normal.     Left Ear: External ear normal.  Eyes:     General: No scleral icterus.       Right eye: No discharge.        Left eye: No discharge.     Conjunctiva/sclera: Conjunctivae normal.     Comments: Patient is legally blind  Neck:     Trachea: No tracheal deviation.  Cardiovascular:     Rate and Rhythm: Normal rate and regular rhythm.     Heart sounds: Normal heart sounds. No murmur heard.   Pulmonary:     Effort: Pulmonary effort is normal. No respiratory distress.     Breath sounds: Normal breath sounds. No stridor. No wheezing or rales.  Abdominal:     General: Bowel sounds are normal. There is no distension.     Palpations: Abdomen is soft.     Tenderness: There is no abdominal tenderness. There is no guarding or rebound.  Musculoskeletal:        General: No tenderness.     Cervical back: Neck supple. No rigidity.     Right lower leg: Edema present.     Left lower leg: Edema present.  Skin:    General: Skin is warm and dry.     Findings: No rash.  Neurological:     Mental Status: He is alert.     Cranial Nerves: No cranial nerve deficit (no facial droop, extraocular movements intact, no slurred speech).     Sensory: No sensory deficit.     Motor: No abnormal muscle tone or seizure activity.     Coordination: Coordination normal.     Comments: 5 out of 5 strength bilateral upper extremities and lower extremities, sensation intact throughout, no facial droop, patient is legally blind, unable to do finger-to-nose exam,  patient is somewhat slow to answer questions.  He will start to answer some questions then appeared to lose his train of thought and not completely answer the question     ED Results / Procedures / Treatments   Labs (all labs ordered are listed, but only abnormal results are  displayed) Labs Reviewed  CBC - Abnormal; Notable for the following components:      Result Value   WBC 13.2 (*)    Hemoglobin 12.2 (*)    MCH 23.9 (*)    MCHC 29.8 (*)    RDW 15.9 (*)    All other components within normal limits  COMPREHENSIVE METABOLIC PANEL - Abnormal; Notable for the following components:   Chloride 96 (*)    Glucose, Bld 150 (*)    BUN 80 (*)    Creatinine, Ser 2.51 (*)    Albumin 3.3 (*)    AST 89 (*)    ALT 131 (*)    Alkaline Phosphatase 143 (*)    Total Bilirubin 1.5 (*)    GFR calc non Af Amer 25 (*)    GFR calc Af Amer 29 (*)    All other components within normal limits  AMMONIA - Abnormal; Notable for the following components:   Ammonia 42 (*)    All other components within normal limits  I-STAT ARTERIAL BLOOD GAS, ED - Abnormal; Notable for the following components:   pH, Arterial 7.470 (*)    pO2, Arterial 71 (*)    Bicarbonate 34.4 (*)    TCO2 36 (*)    Acid-Base Excess 9.0 (*)    Potassium 3.4 (*)    All other components within normal limits  TROPONIN I (HIGH SENSITIVITY) - Abnormal; Notable for the following components:   Troponin I (High Sensitivity) 30 (*)    All other components within normal limits  SARS CORONAVIRUS 2 BY RT PCR (HOSPITAL ORDER, [facility])  BRAIN NATRIURETIC PEPTIDE  TROPONIN I (HIGH SENSITIVITY)    EKG None  Radiology DG Chest Portable 1 View  Result Date: 01/25/2020 CLINICAL DATA:  Shortness of breath and chest pain. EXAM: PORTABLE CHEST 1 VIEW COMPARISON:  Chest x-ray dated May 02, 2019. FINDINGS: Unchanged right chest wall pacemaker. Unchanged cardiomegaly. Chronic pulmonary vascular congestion. Mild left basilar atelectasis. No focal consolidation, pleural effusion, or pneumothorax. No acute osseous abnormality. IMPRESSION: 1. No active disease. Chronic cardiomegaly and pulmonary vascular congestion. Electronically Signed   By: Titus Dubin M.D.   On: 01/25/2020 14:40     Procedures Procedures (including critical care time)  Medications Ordered in ED Medications  sodium chloride flush (NS) 0.9 % injection 3 mL (3 mLs Intravenous Not Given 01/25/20 1455)    ED Course  I have reviewed the triage vital signs and the nursing notes.  Pertinent labs & imaging results that were available during my care of the patient were reviewed by me and considered in my medical decision making (see chart for details).  Clinical Course as of Jan 24 1529  Fri Jan 25, 2020  1506 ABG reviewed.  No hypercarbia noted.  Metabolic panel shows stable renal function although LFTs are elevated.  Ammonia is also slightly elevated   [JK]  1506 Troponin elevated but stable   [JK]    Clinical Course User Index [JK] Dorie Rank, MD   MDM Rules/Calculators/A&P  Patient presented to ED for evaluation after and episode  Of altered mental status while getting a CT scan today.  Questionable syncope versus seizure.  Patient remained stable in the ED but he does appear slightly confused.  Initial work-up does not show any signs of hypercarbia.  His renal dysfunction is stable.  His LFT elevation is new compared to recent.  He does have slightly elevated ammonia levels as well.  Head CT is pending.  I will also order an ultrasound of the abdomen.  Think the patient would benefit from admission and further evaluation, possible neurology evaluation, EEG.  Care will be turned over to oncoming team. Final Clinical Impression(s) / ED Diagnoses Final diagnoses:  Elevated LFTs  Syncope, unspecified syncope type      Dorie Rank, MD 01/25/20 1530

## 2020-01-25 NOTE — ED Provider Notes (Signed)
6:26 PM I discussed the patient's radiographic findings with our radiologist.  Patient found to have bilateral intraventricular hemorrhage.  Given his history of several days of headache, with acute episode of seizure versus syncope earlier today, this new finding suggests likely etiology.  Ultrasound reviewed, though there is gallbladder sludge, no evidence for acute cholecystitis.  6:51 PM Patient's blood pressure 160/80.  Cleviprex ordered.  Patient accompanied by his daughter, she is aware of the findings as well. I discussed his case with our neurology colleagues, for further monitoring, management.  8:25 PM Pressure now diminished, in spite of Cleviprex order, the patient is receiving labetalol pushes instead. I discussed the patient's presentation with our neurology colleagues at length.  With consideration of intraventricular hemorrhage, syncope versus seizure event earlier today, as above, patient will require admission.  For blood pressure control, labetalol, as above, will be provided as needed.  CRITICAL CARE Performed by: Carmin Muskrat Total critical care time: 40 minutes Critical care time was exclusive of separately billable procedures and treating other patients. Critical care was necessary to treat or prevent imminent or life-threatening deterioration. Critical care was time spent personally by me on the following activities: development of treatment plan with patient and/or surrogate as well as nursing, discussions with consultants, evaluation of patient's response to treatment, examination of patient, obtaining history from patient or surrogate, ordering and performing treatments and interventions, ordering and review of laboratory studies, ordering and review of radiographic studies, pulse oximetry and re-evaluation of patient's condition.    Carmin Muskrat, MD 01/25/20 2025

## 2020-01-25 NOTE — H&P (Addendum)
Date: 01/25/2020               Patient Name:  [name] MRN: [id number]  DOB: 1945/08/19 Age / Sex: 74 y.o., male   PCP: [name], MD         Medical Service: Internal Medicine Teaching Service         Attending Physician: Dr. [name], MD    First Contact: Dr. Marianna Payment Pager: [phone]  Second Contact: Dr. [name] Seller Pager: [phone]       After Hours (After 5p/  First Contact Pager: [phone]  weekends / holidays): Second Contact Pager: [phone]   Chief Complaint: Headache  History of Present Illness: Patient is a 74 year old male with past medical history significant for prior intracranial hemorrhage, HFpEF, CKD stage IV, hypertension, diabetes mellitus who presents for evaluation of headache.  He was in his usual state of health until 3 days ago when he began experiencing headaches. Preceding the episode of headache, he was not exerting himself. His daughter agreed with this assessment. His headache was bifrontal, around his eyes, non-radiating. He describes that headache as just "pain" and intermittent. His pain was not relieved with tylenol and particularly worse at night. The daughter reports that he had also experienced dizziness. Prior to the headache, he had an episode of fall about 2-3 weeks ago. He fell twice on his dresser and does not recall if he had a head trauma. Daughter re-iterates that he had just finished using the bathroom and afterwards could not find his walking cane, she does not think he hit his head. His became unsteady on his feet and subsequently fell. Daughter also reports that he has some aphasia at baseline but his orientation seems off to her. Patient denies chest pain, shortness of breath, or abdominal pain. Daughter reports episode of nausea/vomiting while in ER, green emesis, no blood observed.  Meds: Cardiac: *Aspirin 81 mg daily *Rosuvastatin 20 mg at bedtime *Amlodipine 10 mg daily *Hydralazine 100 mg 3 times daily *Toprol-XL 50  mg daily *Metolazone 2.5 mg daily *Torsemide 120 mg twice daily *Potassium chloride 80 mEq twice daily Endocrine: *Lantus 90 units daily *Liraglutide 1.8 mg daily Analgesic: *Acetaminophen as needed *Gabapentin 300 mg twice daily *Febuxostat 80 mg every morning Other: *Senokot as needed *Eucerin cream as needed *Amoxicillin as needed before dental work *Oral mouth rinse as needed *Menthol as needed for shoulder pain *MiraLAX as needed *Loperamide as needed *Pepcid 20 mg every morning Neuro/Psych: *Citalopram 20 mg every morning  Allergies: Allergies as of 01/25/2020  . (No Known Allergies)   Past Medical History:  Diagnosis Date  . Arthritis    "all over" (06/23/2013)  . Atrial fibrillation ([place])    not Candidate for Coumadin d/t rectal bleeding  . Benign prostatic hypertrophy   . Cardiomyopathy, hypertensive ([place])     diastolic dysfunction by 3428 echo  . Coronary artery disease     nonobstructive. 2 vessel. cardiac catheter in March 2005 showing 60% LAD occlusion, 30% circumflex occlusion.  . Degenerative joint disease     Right knee.  . Diabetes type 2, uncontrolled ([place])   . Diabetic peripheral neuropathy ([place])   . Diabetic ulcer of thigh ([place])    H/O Rt thigh ulcer.  . Exertional shortness of breath    "comes and goes" (06/23/2013)  . GERD (gastroesophageal reflux disease)   . Gout   . Herpes   . Hyperlipidemia   . Hypertension   . Hypotension 09/05/2013  .  Incarcerated ventral hernia    H/O. S/P repair ventral hernia repair 07/2005.  Marland Kitchen LBBB (left bundle branch block)   . Legally blind   . Morbid obesity ([place])   . Obstructive sleep apnea    On CPAP. Last sleep study (06/2009) - Severe obstructive sleep apnea/hypopnea syndrome, apnea-hypopnea index 70.5 per hour with non positional events moderately loud snoring and oxygen desaturation to a nadir of 85% on room air.  . Occult blood positive stool     History of in August 2007.  colonoscopy in January 2008  showing normal colon with fair inadequate prep. By Dr. Silvano Rusk.  . 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 Sepsis ([place]) 08/26/2013    Family History: Family History  Problem Relation Age of Onset  . Coronary artery disease Sister   . Heart disease Sister   . Allergies Sister   . Asthma Brother   . Rheum arthritis Brother    Social History:  *Denies current alcohol, tobacco, or recreational drug usage *Reports spoke for long time (but not every day) before quitting, cannot recall exact duration, volume *Goes to [employer] [facility] on Mondays and Fridays. Has home aide for 2 hours in mornings and 3 hours in evenings  Review of Systems: A complete ROS was negative except as per HPI.   Physical Exam: Blood pressure (!) 145/72, pulse 66, temperature (!) 97.5 F (36.4 C), temperature source Oral, resp. rate 17, SpO2 95 %. Physical Exam Constitutional:      Appearance: He is not diaphoretic.  HENT:     Head: Normocephalic and atraumatic.  Eyes:     General:        Right eye: No discharge.        Left eye: No discharge.  Neck:     Trachea: No tracheal deviation.  Cardiovascular:     Rate and Rhythm: Normal rate and regular rhythm.     Heart sounds: No murmur heard.  No friction rub. No gallop.   Pulmonary:     Effort: Pulmonary effort is normal. No respiratory distress.     Breath sounds: Normal breath sounds. No wheezing or rales.  Abdominal:     General: There is no distension.     Palpations: Abdomen is soft.     Tenderness: There is no abdominal tenderness. There is no guarding or rebound.  Musculoskeletal:        General: No tenderness or deformity. Normal range of motion.     Cervical back: Normal range of motion.  Skin:    General: Skin is warm and dry.     Findings: No erythema or rash.  Neurological:     General: No focal deficit present.     Mental Status: He is alert.     Coordination: Coordination normal.     Comments: Slow to respond, oriented  to self, location (town), does not remember hospital name or year. Reports word-finding difficulty. Strength full and equal bilaterally, no focal deficit. Cranial nerve exam limited 2/2 patient difficulty with instructions Blind, baseline  Psychiatric:        Cognition and Memory: Memory normal.        Judgment: Judgment normal.    EKG: personally reviewed my interpretation is ventricular pacing, no significant evidence for acute ischemia, QTc ~ 550 msec  CXR: personally reviewed my interpretation is pulmonary vascular congestion  CT Head WO Contrast (01/25/20): IMPRESSION: There is hemorrhage in the dependent portion of each lateral ventricle of uncertain  etiology. No brain parenchymal hemorrhage elsewhere noted. No mass. No extra-axial fluid.  There is mild atrophy with extensive periventricular small vessel disease. No acute appearing infarct is evident.  There are multiple foci of arterial vascular calcification.  There is mucosal thickening in several ethmoid air cells.  Critical Value/emergent results were called by telephone at the time of interpretation on 01/25/2020 at 6:21 pm to provider Carmin Muskrat, who verbally acknowledged these results.  RUQ [place] (01/25/20): IMPRESSION: Gallbladder sludge and gallstones without complicating factors.  Right renal cysts stable from prior CT examination.  Chest Radiograph (01/25/20): IMPRESSION: 1. No active disease. Chronic cardiomegaly and pulmonary vascular Congestion.  TTE (06/07/2019): IMPRESSIONS  1. Left ventricular ejection fraction, by visual estimation, is 55 to  60%. The left ventricle has normal function. Normal left ventricular size.  There is no left ventricular hypertrophy.  2. Global right ventricle has normal systolic function.The right  ventricular size is mildly enlarged. No increase in right ventricular wall  thickness.  3. Left atrial size was mildly dilated.  4. Right atrial size was normal.    5. The mitral valve is normal in structure. No evidence of mitral valve  regurgitation. No evidence of mitral stenosis.  6. The tricuspid valve is normal in structure. Tricuspid valve  regurgitation was not visualized by color flow Doppler.  7. Aortic valve mean gradient measures 11.6 mmHg.  8. Aortic valve peak gradient measures 18.1 mmHg.  9. Aortic valve regurgitation was not visualized by color flow Doppler.  Structurally normal aortic valve, with no evidence of sclerosis or  stenosis.  10. Edwards Sapien 3 Ultra THV (size 29 mm, model # 9750TFX, serial #  [id number])   Peak transaortic velocity 2.51m/s, mean gradient 81mmHg.  11. The pulmonic valve was normal in structure. Pulmonic valve  regurgitation is not visualized by color flow Doppler.  12. A pacer wire is visualized.  13. The inferior vena cava is normal in size with greater than 50%  respiratory variability, suggesting right atrial pressure of 3 mmHg.    CBC Latest Ref Rng & Units 01/25/2020 01/25/2020 11/05/2019  WBC 4.0 - 10.5 K/uL - 13.2(H) 10.2  Hemoglobin 13.0 - 17.0 g/dL 13.3 12.2(L) 11.1(L)  Hematocrit 39 - 52 % 39.0 40.9 37.3(L)  Platelets 150 - 400 K/uL - 219 173   CMP Latest Ref Rng & Units 01/25/2020 01/25/2020 01/16/2020  Glucose 70 - 99 mg/dL - 150(H) 188(H)  BUN 8 - 23 mg/dL - 80(H) 92(H)  Creatinine 0.61 - 1.24 mg/dL - 2.51(H) 3.33(H)  Sodium 135 - 145 mmol/L 139 135 135  Potassium 3.5 - 5.1 mmol/L 3.4(L) 4.8 4.1  Chloride 98 - 111 mmol/L - 96(L) 97(L)  CO2 22 - 32 mmol/L - 27 24  Calcium 8.9 - 10.3 mg/dL - 9.1 8.9  Total Protein 6.5 - 8.1 g/dL - 7.4 -  Total Bilirubin 0.3 - 1.2 mg/dL - 1.5(H) -  Alkaline Phos 38 - 126 U/L - 143(H) -  AST 15 - 41 U/L - 89(H) -  ALT 0 - 44 U/L - 131(H) -   ABG    Component Value Date/Time   PHART 7.470 (H) 01/25/2020 1459   PCO2ART 47.3 01/25/2020 1459   PO2ART 71 (L) 01/25/2020 1459   HCO3 34.4 (H) 01/25/2020 1459   TCO2 36 (H) 01/25/2020 1459    ACIDBASEDEF 1.1 10/02/2013 0353   O2SAT 95.0 01/25/2020 1459   Troponin: 30  BNP: 61.1  Assessment & Plan by Problem: Active Problems:  ICH (intracerebral hemorrhage) [facility])  Patient is a 74 year old male with past medical history significant for prior intracranial hemorrhage, HFpEF, CKD stage IV, hypertension, diabetes mellitus who presented for evaluation of headache and was found to have bilateral intraventricular hemorrhage.  # Bilateral intraventricular hemorrhage # Hypertension Patient found to have bilateral intraventricular hemorrhage on CT head in the setting of 3 days of headache.  Neurology was consulted by ER physician, and recommendation was to maintain SBP less than 160.  Neurology advises that given that bleed is not acute, patient does not require Cleviprex drip. Discussed difficulty controlling with labetalol pushes. Advised restarting home BP meds and continuing with labetalol pushes with goal of < 160 *Maintain SBP <160 with as needed labetalol *Continue home antihypertensives of hydralazine 100 mg 3 times daily + amlodipine 10 mg daily *Hold home aspirin  # HFpEF Continue torsemide at 100 mg twice daily (home dose of 120 mg). Reduced dose for torsemide and holding metolazole given hospital related dietary changes, and normovolemic status on physical exam *Torsemide 100 mg twice daily *Potassium repletion as needed  # T2DM Last hemoglobin A1c of 8.0 on 05/07/2019.  Home regimen of Lantus 90 units daily + liraglutide 1.8 mg daily.  We will manage with inpatient regimen of Lantus 70 units daily + aspart sliding scale.  # Depression: On citalopram at home. Will hold centrally acting medications for now   Diet: Full liquid following bedside swallow evaluation DVT Ppx: SCDs, pharmacologic VTE contraindicated 2/2 intraventricular hemorrhage Code Status: FULL CODE Dispo: Admit patient to Inpatient with expected length of stay greater than 2 midnights.  Signed: Jeanmarie Hubert, MD 01/25/2020, 9:43 PM

## 2020-01-25 NOTE — ED Triage Notes (Signed)
Pt here from an outside imaging center where he was getting a scan of his head , pt had a syncopal episode urinated on himself and became confused , pt has had a H/a for the last 3 days and legally blind

## 2020-01-26 ENCOUNTER — Inpatient Hospital Stay (HOSPITAL_COMMUNITY): Payer: Medicare (Managed Care)

## 2020-01-26 DIAGNOSIS — I615 Nontraumatic intracerebral hemorrhage, intraventricular: Principal | ICD-10-CM

## 2020-01-26 LAB — COMPREHENSIVE METABOLIC PANEL
ALT: 119 U/L — ABNORMAL HIGH (ref 0–44)
AST: 52 U/L — ABNORMAL HIGH (ref 15–41)
Albumin: 3.1 g/dL — ABNORMAL LOW (ref 3.5–5.0)
Alkaline Phosphatase: 131 U/L — ABNORMAL HIGH (ref 38–126)
Anion gap: 15 (ref 5–15)
BUN: 75 mg/dL — ABNORMAL HIGH (ref 8–23)
CO2: 28 mmol/L (ref 22–32)
Calcium: 9.3 mg/dL (ref 8.9–10.3)
Chloride: 95 mmol/L — ABNORMAL LOW (ref 98–111)
Creatinine, Ser: 2.37 mg/dL — ABNORMAL HIGH (ref 0.61–1.24)
GFR calc Af Amer: 31 mL/min — ABNORMAL LOW (ref >60)
GFR calc non Af Amer: 27 mL/min — ABNORMAL LOW (ref >60)
Glucose, Bld: 221 mg/dL — ABNORMAL HIGH (ref 70–99)
Potassium: 3.5 mmol/L (ref 3.5–5.1)
Sodium: 138 mmol/L (ref 135–145)
Total Bilirubin: 0.6 mg/dL (ref 0.3–1.2)
Total Protein: 8 g/dL (ref 6.5–8.1)

## 2020-01-26 LAB — BLOOD GAS, ARTERIAL
Acid-Base Excess: 6.4 mmol/L — ABNORMAL HIGH (ref 0.0–2.0)
Bicarbonate: 30.7 mmol/L — ABNORMAL HIGH (ref 20.0–28.0)
Drawn by: 406621
FIO2: 28
O2 Saturation: 95.2 %
Patient temperature: 37
pCO2 arterial: 47 mmHg (ref 32.0–48.0)
pH, Arterial: 7.431 (ref 7.350–7.450)
pO2, Arterial: 72.7 mmHg — ABNORMAL LOW (ref 83.0–108.0)

## 2020-01-26 LAB — CBC
HCT: 40.2 % (ref 39.0–52.0)
Hemoglobin: 12.1 g/dL — ABNORMAL LOW (ref 13.0–17.0)
MCH: 23.9 pg — ABNORMAL LOW (ref 26.0–34.0)
MCHC: 30.1 g/dL (ref 30.0–36.0)
MCV: 79.3 fL — ABNORMAL LOW (ref 80.0–100.0)
Platelets: 198 10*3/uL (ref 150–400)
RBC: 5.07 MIL/uL (ref 4.22–5.81)
RDW: 15.9 % — ABNORMAL HIGH (ref 11.5–15.5)
WBC: 11.4 10*3/uL — ABNORMAL HIGH (ref 4.0–10.5)
nRBC: 0 % (ref 0.0–0.2)

## 2020-01-26 LAB — GLUCOSE, CAPILLARY
Glucose-Capillary: 209 mg/dL — ABNORMAL HIGH (ref 70–99)
Glucose-Capillary: 225 mg/dL — ABNORMAL HIGH (ref 70–99)
Glucose-Capillary: 255 mg/dL — ABNORMAL HIGH (ref 70–99)
Glucose-Capillary: 193 mg/dL — ABNORMAL HIGH (ref 70–99)

## 2020-01-26 LAB — HEMOGLOBIN A1C
Hgb A1c MFr Bld: 8.9 % — ABNORMAL HIGH (ref 4.8–5.6)
Mean Plasma Glucose: 208.73 mg/dL

## 2020-01-26 LAB — TROPONIN I (HIGH SENSITIVITY): Troponin I (High Sensitivity): 33 ng/L — ABNORMAL HIGH (ref <18)

## 2020-01-26 IMAGING — CR DG CHEST 2V
2 series · 2 of 2 positions shown · non-contrast
Comparison: 08/15/2017 and prior exams

CLINICAL DATA: Acute shortness of breath.

EXAM:
CHEST  2 VIEW

[w chest pa]
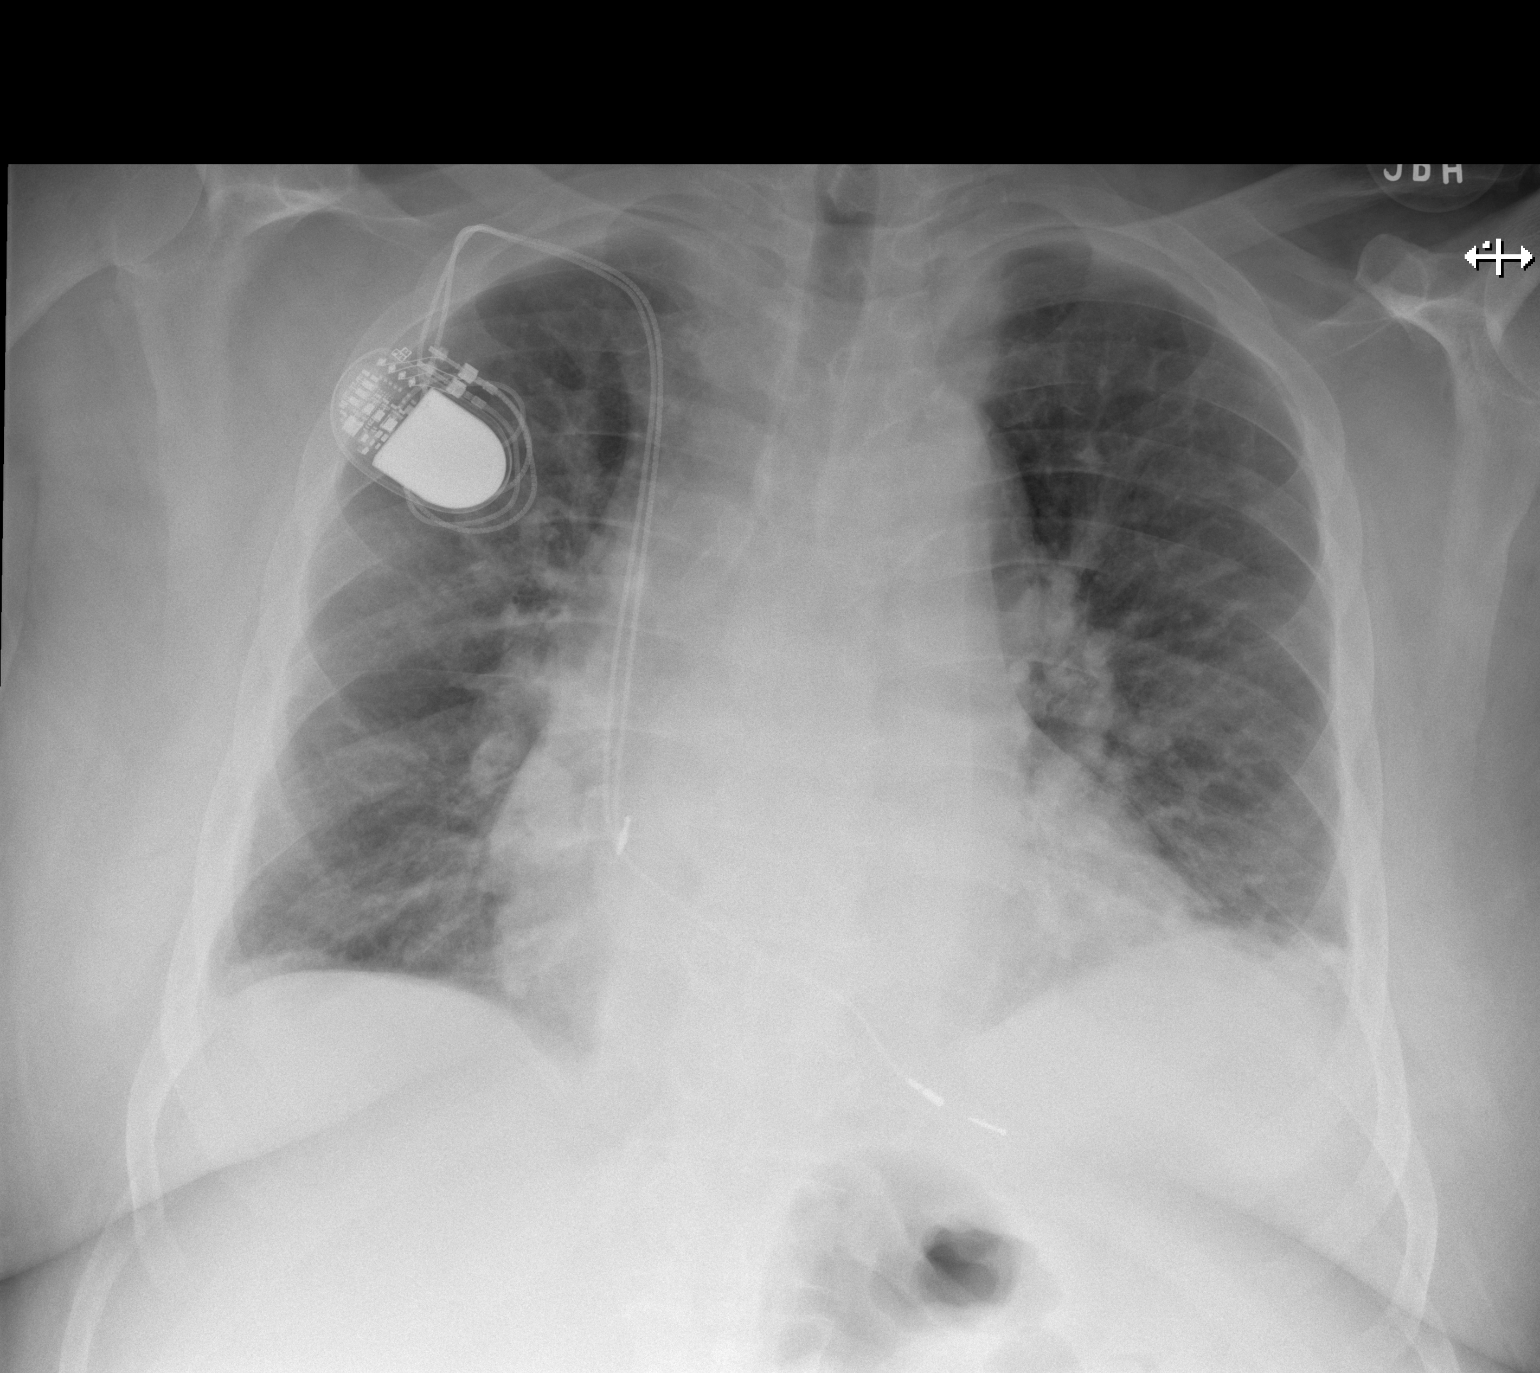

[w chest lat]
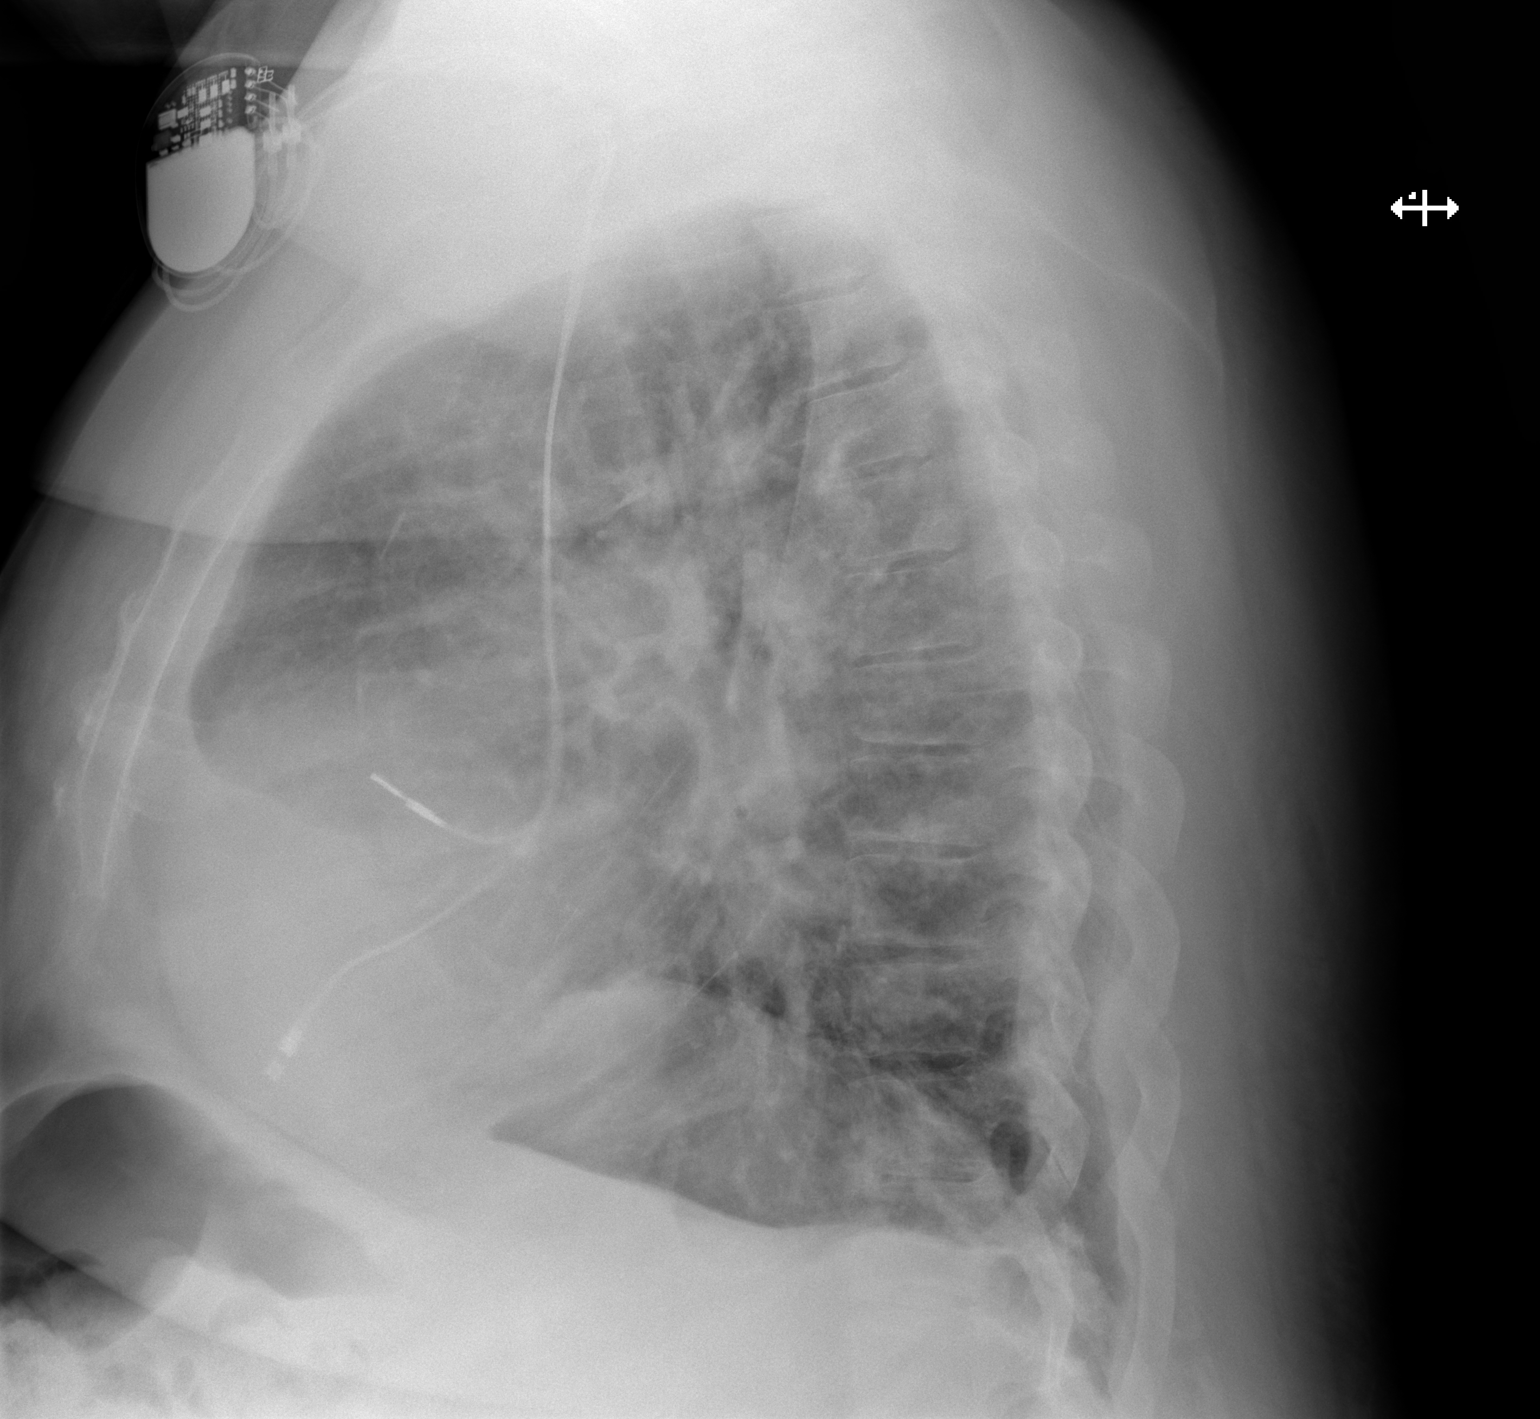

[2 of 2 positions shown; findings below may reference images not displayed]

FINDINGS: Cardiomegaly and pulmonary vascular congestion noted.

RIGHT-sided pacemaker again identified.

LEFT basilar opacity may represent atelectasis or airspace
disease/pneumonia.

No definite pleural effusion or pneumothorax.
IMPRESSION: LEFT basilar opacity which may represent atelectasis or airspace
disease/pneumonia.

Cardiomegaly and pulmonary vascular congestion.

## 2020-01-26 MED ORDER — AMLODIPINE BESYLATE 10 MG PO TABS
10.0000 mg | ORAL_TABLET | Freq: Every day | ORAL | Status: DC
  Administered 2020-01-26: 10 mg via ORAL
  Filled 2020-01-26 (×2): qty 1

## 2020-01-26 MED ORDER — SODIUM CHLORIDE 0.9 % IV SOLN: 8.0000 mg | Freq: Four times a day (QID) | INTRAVENOUS | Status: DC | PRN

## 2020-01-26 MED ORDER — CLEVIDIPINE BUTYRATE 0.5 MG/ML IV EMUL
0.0000 mg/h | INTRAVENOUS | Status: DC
  Filled 2020-01-26: qty 50

## 2020-01-26 MED ORDER — LABETALOL HCL 5 MG/ML IV SOLN
10.0000 mg | Freq: Once | INTRAVENOUS | Status: AC
  Administered 2020-01-26: 10 mg via INTRAVENOUS
  Filled 2020-01-26: qty 4

## 2020-01-26 MED ORDER — LABETALOL HCL 5 MG/ML IV SOLN
10.0000 mg | INTRAVENOUS | Status: DC | PRN
  Administered 2020-01-28 – 2020-01-29 (×3): 10 mg via INTRAVENOUS
  Filled 2020-01-26 (×3): qty 4

## 2020-01-26 MED ORDER — PROCHLORPERAZINE EDISYLATE 10 MG/2ML IJ SOLN
10.0000 mg | Freq: Four times a day (QID) | INTRAMUSCULAR | Status: DC
  Administered 2020-01-26 – 2020-01-27 (×5): 10 mg via INTRAVENOUS
  Filled 2020-01-26 (×5): qty 2

## 2020-01-26 MED ORDER — HYDRALAZINE HCL 50 MG PO TABS
100.0000 mg | ORAL_TABLET | Freq: Three times a day (TID) | ORAL | Status: DC
  Administered 2020-01-26 (×4): 100 mg via ORAL
  Filled 2020-01-26 (×4): qty 2

## 2020-01-26 MED ORDER — ONDANSETRON 4 MG PO TBDP
8.0000 mg | ORAL_TABLET | Freq: Three times a day (TID) | ORAL | Status: DC | PRN
  Administered 2020-01-26: 8 mg via ORAL
  Filled 2020-01-26: qty 2

## 2020-01-26 NOTE — Progress Notes (Signed)
Bedside swallow evaluation at 0805 passed, patient is able to swallow 3 oz of liquid without coughing.

## 2020-01-26 NOTE — Progress Notes (Signed)
Lab notified of ABG being sent at this time. ABG previously ordered at 0454 but was uncollected. This RT spoke with MD and he requested it still be collected.

## 2020-01-26 NOTE — Progress Notes (Signed)
Pt arrived to unit with BP 154/81. PRN labetalol given per orders. Recheck BP 166/87. Notified provider of increase in BP, MD ordered second dose of labetalol.   Checked BP once again before administering second dose of labetalol, BP 179/86. Second dose of labetalol given. MD aware of current BP.   Pt began vomiting small amount of yellow emesis. Provider notified and PRN Zofran given.   Recheck BP 157/85. Provider aware and adjusting BP parameters to <160. New orders for amlodipine and hydralazine.

## 2020-01-26 NOTE — Progress Notes (Addendum)
[facility] Bedside swallow evaluation performed at 1423 per order by Dr. Marianna Payment, MD. Pt was too lethargic to complete the bedside swallow evaluation.  [facility] swallow evaluation was performed in the ED (see note from Lucie Leather, RN), with previous diet of full liquids. Pt was able to drink fluids last night and early this morning without coughing.  Per discussion with Dr. Marianna Payment and current lethargy, order placed by Dr. Marianna Payment for NPO and SLP evaluation.

## 2020-01-26 NOTE — Progress Notes (Addendum)
Pt arrived to unit oriented to self. Daughter at bedside.  Passed swallow screen in ED per ED nurse. Pt is legally blind in both eyes, daughter instructed pt on use of call light by feeling for the button. Pt demonstrated use of call light and instructed not to get OOB without assistance from staff. Pt verbalized understanding, bed alarm on and call light within reach.

## 2020-01-26 NOTE — Progress Notes (Signed)
Pt. Family states pt. Vomited yesterday and today and states it would be best to hold off on cpap tonight.. Pt. Is also blind.

## 2020-01-26 NOTE — Progress Notes (Addendum)
STROKE TEAM PROGRESS NOTE   HISTORY OF PRESENT ILLNESS (per record) [name] is a 74 y.o. male with past medical history significant for atrial fibrillation not on anticoagulation due to history of rectal bleeding, cardiomyopathy, coronary disease, hypertension, legally blind, diabetes type 2, hypertension, hyperlipidemia and morbid obesity presents to the emergency department with nausea and vomiting. Patient had a fall 2 to 3 weeks ago, denies hitting his head.  He has been complaining of headache for the last 2 to 3 days physician at [facility] (daycare) recommend him to get a head CT.  There he became confused, had a syncopal event and therefore they brought the patient this department. He had showed layering of blood in bilateral occipital horns that appears likely subacute of undetermined etiology-no edema or hydrocephalus.  Neurology was consulted for further recommendations.  Patient recently blood pressure medication increased and been complaining of dizziness.  Patient not on blood thinner, on ASA. BP has remained less than 017 systolic in ED.  Date last known well: 3 days ago tPA Given: no hemorrhage Baseline MRS 3 Intracerebral Hemorrhage (ICH) Score  Glascow Coma Score  13-15 0  Age >/= 80 yes no 0  ICH volume >/= 33ml  no 0  [name] yes +1  Infratentorial origin yes no 0 Total:  1   INTERVAL HISTORY His family is not at bedside. He is sleepy, he is on cpap and did not have it last night. Today lethargic but nurse reports this is stable from overnight not an acute change. He had N/V last night and wasn't put on cpap ;ast night but they are aware and may get it tonight. He is going to get MRI/MRA Monday due to pacemaker, creatinine 2.37 so can't get CTA or any contrast. CT of the head today stable, no hydrocephalus.    OBJECTIVE Vitals:   01/26/20 0533 01/26/20 0745 01/26/20 1302 01/26/20 1313  BP: (!) 157/71 (!) 150/80 (!) 144/72   Pulse:  72 71   Resp:  20 (!) 30 (!) 24   Temp:  98 F (36.7 C) 98 F (36.7 C)   TempSrc:  Oral Oral   SpO2:      Weight:      Height:        CBC:  Recent Labs  Lab 01/25/20 1356 01/25/20 1356 01/25/20 1459 01/26/20 0029  WBC 13.2*  --   --  11.4*  HGB 12.2*   < > 13.3 12.1*  HCT 40.9   < > 39.0 40.2  MCV 80.2  --   --  79.3*  PLT 219  --   --  198   < > = values in this interval not displayed.    Basic Metabolic Panel:  Recent Labs  Lab 01/25/20 1356 01/25/20 1356 01/25/20 1459 01/26/20 0029  NA 135   < > 139 138  K 4.8   < > 3.4* 3.5  CL 96*  --   --  95*  CO2 27  --   --  28  GLUCOSE 150*  --   --  221*  BUN 80*  --   --  75*  CREATININE 2.51*  --   --  2.37*  CALCIUM 9.1  --   --  9.3   < > = values in this interval not displayed.    Lipid Panel:     Component Value Date/Time   CHOL 111 08/20/2019 1122   CHOL 129 01/05/2017 1235   TRIG 109 08/20/2019 1122  HDL 32 (L) 08/20/2019 1122   HDL 36 (L) 01/05/2017 1235   CHOLHDL 3.5 08/20/2019 1122   VLDL 22 08/20/2019 1122   LDLCALC 57 08/20/2019 1122   LDLCALC 71 01/05/2017 1235   HgbA1c:  Lab Results  Component Value Date   HGBA1C 8.9 (H) 01/26/2020   Urine Drug Screen:     Component Value Date/Time   LABOPIA NONE DETECTED 10/01/2013 1915   COCAINSCRNUR NONE DETECTED 10/01/2013 1915   COCAINSCRNUR NEG 12/16/2008 2246   LABBENZ NONE DETECTED 10/01/2013 1915   LABBENZ NEG 12/16/2008 2246   AMPHETMU NONE DETECTED 10/01/2013 1915   THCU NONE DETECTED 10/01/2013 1915   LABBARB NONE DETECTED 10/01/2013 1915    Alcohol Level     Component Value Date/Time   ETH <11 10/01/2013 1504    IMAGING  CT HEAD WO CONTRAST 01/26/2020 IMPRESSION:  Unchanged intraventricular blood layering within the occipital horns of the lateral ventricles.   CT Head Wo Contrast 01/25/2020 IMPRESSION:  There is hemorrhage in the dependent portion of each lateral ventricle of uncertain etiology. No brain parenchymal hemorrhage elsewhere noted. No mass. No  extra-axial fluid. There is mild atrophy with extensive periventricular small vessel disease. No acute appearing infarct is evident. There are multiple foci of arterial vascular calcification. There is mucosal thickening in several ethmoid air cells.   DG Chest Portable 1 View 01/25/2020 IMPRESSION:  No active disease. Chronic cardiomegaly and pulmonary vascular congestion.   US Abdomen Limited RUQ 01/25/2020 IMPRESSION:  Gallbladder sludge and gallstones without complicating factors. Right renal cysts stable from prior CT examination.    ECG - paced - rate 69 BPM.    PHYSICAL EXAM Blood pressure (!) 144/72, pulse 71, temperature 98 F (36.7 C), temperature source Oral, resp. rate (!) 24, height 6\' 2"  (1.88 m), weight (!) 143.8 kg, SpO2 95 %.  Patient is in no acute distress, well developed, normocephalic, atraumatic, regular rate and rhythm.  Neuro: Patient is lethargic, arousable but keeps closing eyes, oriented to self, would not answer to month or year and kept closing eyes, not cooperative when asked what year, month or to repeat, follows simple commands with encouragement, decreased visual fields to confrontation(legally blind), pupils round and pin point, extraocular movements appear intact, face symmetric smile, cannot assess sensation in face or limbs as patient just grunts when asked if he feels touch but does grimace to pain x 4 less so in the left UE, hearing appears intact to voice, uvula non cooperative,, midline tongue, poor effort but can lift all limbs antigravity without apparent drift but largely non cooperative, increased tone right arm, otherwise normal tone and bulk, left toes absent, no Babinski on right, not cooperative for FTN/HTS finger-to-nose.  Did not test gait or station.    ASSESSMENT/PLAN [name] is a 74 y.o. male with history of atrial fibrillation not on anticoagulation due to history of rectal bleeding (on ASA), cardiomyopathy, PPM, coronary  disease, hypertension, legally blind, diabetes type 2, hypertension, hyperlipidemia and morbid obesity  presenting with nausea, vomiting, headache, recent fall, recent syncope, and AMS. He did not receive IV t-PA due to [place].  ICH: hemorrhage in the dependent portion of each lateral ventricle. Unknown etiology possibly due to fall, pending MRI and MRA Monday (has pacemaker). MRA to evaluate for cerebral aneurysm or other arteriovenous etiologies.  CT Head - 01/25/20 - There is hemorrhage in the dependent portion of each lateral ventricle of uncertain etiology. No brain parenchymal hemorrhage elsewhere noted. No mass. No extra-axial  fluid. There is mild atrophy with extensive periventricular small vessel disease  CT head - 01/26/20 - Unchanged intraventricular blood layering within the occipital horns of the lateral ventricles.   MRI head - pending (PPM)  MRA head - pending (PPM)  CTA H&N - not ordered, CKD cannot have contrast  CT Perfusion - not indicated  Carotid Doppler - not indicated  2D Echo - not indicated  [employer] Virus 2 - negative  LDL - not ordered  HgbA1c - 8.9  UDS - not ordered  VTE prophylaxis - SCDs Diet  Diet Order            Diet full liquid Room service appropriate? Yes; Fluid consistency: Thin  Diet effective now                 aspirin 81 mg daily prior to admission, now on No antithrombotic - ICH  Ongoing aggressive stroke risk factor management  Therapy recommendations:  pending  Disposition:  Pending  Hypertension  Home BP meds: amlodipine ; apresoline ; metoprolol ; Demadex ; Zaroxolyn   Current BP meds: amlodipine ; hydralazine ; Demadex  Stable . SBP goal < 160 mm Hg . Long-term BP goal normotensive  Hyperlipidemia  Home Lipid lowering medication: Crestor 20 mg daily  LDL not ordered, goal < 70  Current lipid lowering medication: None (statin contraindicated with ICH)  Continue statin at discharge  Diabetes  Home  diabetic meds: insulin  Current diabetic meds: insulin  HgbA1c 8.9, goal < 7.0 Recent Labs    01/25/20 2308 01/26/20 0609 01/26/20 1212  GLUCAP 165* 209* 225*    Other Stroke Risk Factors  Advanced age  Former cigarette smoker - quit  ETOH use, advised to drink no more than 1 alcoholic beverage per day.  Obesity, Body mass index is 40.7 kg/m., recommend weight loss, diet and exercise as appropriate   Coronary artery disease  Obstructive sleep apnea, on CPAP at home  Congestive Heart Failure  Atrial fibrillation (not anticoagulated but on ASA)  Other Active Problems  Code status - Full code  Legally blind  Atrial fibrillation (ASA PTA)  PPM for bradycardia  Cardiomyopathy  Hx of rectal bleeding CKD - stage 4 - creatinine - 2.37  Hospital day # 1  Personally examined patient and images, and have participated in and made any corrections needed to history, physical, neuro exam,assessment and plan as stated above.  I have personally obtained the history, evaluated lab date, reviewed imaging studies and agree with radiology interpretations.    Sarina Ill, MD Stroke Neurology  I spent 35 minutes of face-to-face and non-face-to-face time with patient. This included prechart review, lab review, study review, order entry, electronic health record documentation, patient education on the different diagnostic and therapeutic options, counseling and coordination of care, risks and benefits of management, compliance, or risk factor reduction   To contact Stroke Continuity provider, please refer to [email or web address] After hours, contact General Neurology

## 2020-01-26 NOTE — Consult Note (Addendum)
Requesting Physician: Dr.     Laurel Dimmer Complaint: headache  History obtained from: Patient and Chart   HPI:                                                                                                                                       [name] is a 74 y.o. male with past medical history significant for atrial fibrillation not on anticoagulation due to history of rectal bleeding, cardiomyopathy, coronary disease, hypertension, legally blind, diabetes type 2, hypertension, hyperlipidemia and morbid obesity  presents to the emergency department presents, nausea and vomiting.  Patient had a fall 2 to 3 weeks ago, denies hitting his head.  He has been complaining of headache for the last 2 to 3 days physician at [facility] (daycare) recommend him to get a head CT.  There he became confused, had a syncopal event and therefore they brought the patient this department.  He had showed layering of blood in bilateral occipital horns that appears likely subacute of undetermined etiology-no edema or hydrocephalus.  Neurology was consulted for further recommendations.  Patient recently blood pressure medication increased and been complaining of dizziness.  Patient not on blood thinner, on ASA. BP has remained less than 355 systolic in ED.   Date last known well: 3 days ago tPA Given: no hemorrhage Baseline MRS 3  Intracerebral Hemorrhage (ICH) Score  Glascow Coma Score  13-15 0  Age >/= 80 yes no 0  ICH volume >/= 79ml  no 0  [name] yes +1  Infratentorial origin yes no 0 Total:  1   Past Medical History:  Diagnosis Date  . Arthritis    "all over" (06/23/2013)  . Atrial fibrillation ([place])    not Candidate for Coumadin d/t rectal bleeding  . Benign prostatic hypertrophy   . Cardiomyopathy, hypertensive ([place])     diastolic dysfunction by 7322 echo  . Coronary artery disease     nonobstructive. 2 vessel. cardiac catheter in March 2005 showing 60% LAD occlusion, 30% circumflex occlusion.   . Degenerative joint disease     Right knee.  . Diabetes type 2, uncontrolled ([place])   . Diabetic peripheral neuropathy ([place])   . Diabetic ulcer of thigh ([place])    H/O Rt thigh ulcer.  . Exertional shortness of breath    "comes and goes" (06/23/2013)  . GERD (gastroesophageal reflux disease)   . Gout   . Herpes   . Hyperlipidemia   . Hypertension   . Hypotension 09/05/2013  . Incarcerated ventral hernia    H/O. S/P repair ventral hernia repair 07/2005.  Marland Kitchen LBBB (left bundle branch block)   . Legally blind   . Morbid obesity ([place])   . Obstructive sleep apnea    On CPAP. Last sleep study (06/2009) - Severe obstructive sleep apnea/hypopnea syndrome, apnea-hypopnea index 70.5 per hour with non positional events moderately loud snoring and oxygen desaturation to a nadir  of 85% on room air.  . Occult blood positive stool     History of in August 2007.  colonoscopy in January 2008 showing normal colon with fair inadequate prep. By Dr. Silvano Rusk.  . 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 Sepsis ([place]) 08/26/2013    Past Surgical History:  Procedure Laterality Date  . AMPUTATION Left 06/22/2013   Procedure: AMPUTATION FOOT;  Surgeon: Newt Minion, MD;  Location: [place];  Service: Orthopedics;  Laterality: Left;  Left Midfoot Amputation  . CARDIAC CATHETERIZATION  10/22/2003   Dilatation of the LV with preserved LV systolic function. Tortous aorta, but no AAA. Moderate disease with 50-60% area of narrowing in the circumflex after the second OM and 70% mid narrowing in the LAD. No intervention.  Marland Kitchen CARDIOVASCULAR STRESS TEST  01/23/2010   Perfusion defect seen in inferior myocardial region is consistent with diaphragmatic attenuation. Remaining myocardium demonstrates normal myocardial perfusion with no evidence of ischemia or infarct. Stress ECG was non-diagnostic due to LBBB.  Marland Kitchen CATARACT EXTRACTION, BILATERAL Bilateral   . ESOPHAGOGASTRODUODENOSCOPY N/A 10/12/2013   Procedure:  ESOPHAGOGASTRODUODENOSCOPY (EGD);  Surgeon: Gwenyth Ober, MD;  Location: [place];  Service: General;  Laterality: N/A;  bedside  . EYE SURGERY    . 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 HERNIA REPAIR    . INSERT / REPLACE / REMOVE PACEMAKER  November 29, 2003   Secondary to symptomatic bradycardia. DDDR pacemaker. By Dr. Rollene Fare.  Randolm Idol / REPLACE / REMOVE PACEMAKER  11-28-2012   "old pacemaker died; had new one put in" (July 16, 2013)  . Pacemaker lead revision  12/2003    likely microdislodgment  of right ventricular lead  . PEG PLACEMENT N/A 10/12/2013   Procedure: PERCUTANEOUS ENDOSCOPIC GASTROSTOMY (PEG) PLACEMENT;  Surgeon: Gwenyth Ober, MD;  Location: [place];  Service: General;  Laterality: N/A;  . PERMANENT PACEMAKER GENERATOR CHANGE N/A 11/03/2012   Procedure: PERMANENT PACEMAKER GENERATOR CHANGE;  Surgeon: Sanda Klein, MD;  Location: [place] CATH LAB;  Service: Cardiovascular;  Laterality: N/A;  . REFRACTIVE SURGERY Bilateral   . RIGHT HEART CATH N/A 09/05/2018   Procedure: RIGHT HEART CATH;  Surgeon: Larey Dresser, MD;  Location: [place] CV LAB;  Service: Cardiovascular;  Laterality: N/A;  . RIGHT/LEFT HEART CATH AND CORONARY ANGIOGRAPHY N/A 04/10/2019   Procedure: RIGHT/LEFT HEART CATH AND CORONARY ANGIOGRAPHY;  Surgeon: [name], MD;  Location: [place] CV LAB;  Service: Cardiovascular;  Laterality: N/A;  . TEE WITHOUT CARDIOVERSION N/A 09/05/2018   Procedure: TRANSESOPHAGEAL ECHOCARDIOGRAM (TEE);  Surgeon: Larey Dresser, MD;  Location: [facility] ENDOSCOPY;  Service: Cardiovascular;  Laterality: N/A;  . TEE WITHOUT CARDIOVERSION N/A 05/08/2019   Procedure: TRANSESOPHAGEAL ECHOCARDIOGRAM (TEE);  Surgeon: [name], MD;  Location: [place] CV LAB;  Service: Open Heart Surgery;  Laterality: N/A;  . TRANSCATHETER AORTIC VALVE REPLACEMENT, TRANSFEMORAL N/A 05/08/2019   Procedure: TRANSCATHETER AORTIC VALVE REPLACEMENT, TRANSFEMORAL;   Surgeon: [name], MD;  Location: [place] CV LAB;  Service: Open Heart Surgery;  Laterality: N/A;  . TRANSTHORACIC ECHOCARDIOGRAM  01/01/2013   EF 50-55%, ventricular septum-thickness moderately increased  . VENOUS DOPPLER  01/17/2013   No evidence of thrombus or thrombophlebitis. Left GSVs appear enlarged and demonstrate valvular insuffiiciency. Bilateral SSVs appeared patent with no valvular insufficiency seen.  Marland Kitchen VENTRAL HERNIA REPAIR  07/2005    S/P laparotomy, lysis of adhesions, repair of incarcerated ventral hernia with  partial omentectomy.. Performed by Dr.  Marlou Starks.    Family History  Problem Relation Age of Onset  . Coronary artery disease Sister   . Heart disease Sister   . Allergies Sister   . Asthma Brother   . Rheum arthritis Brother    Social History:  reports that he quit smoking about 16 years ago. His smoking use included cigarettes. He smoked 0.00 packs per day for 0.00 years. He has never used smokeless tobacco. He reports current alcohol use. He reports that he does not use drugs.  Allergies: No Known Allergies  Medications:                                                                                                                        I reviewed home medications   ROS:                                                                                                                                     Systems reviewed, positive for chest pain and shortness of breath   Examination:                                                                                                      General: Appears well-developed  Psych: Affect appropriate to situation Eyes: No scleral injection HENT: No OP obstrucion Head: Normocephalic.  Cardiovascular: Normal rate and regular rhythm. Respiratory: Effort normal and breath sounds normal to anterior ascultation GI: Soft.  No distension. There is no tenderness.  Skin: WDI    Neurological Examination Mental  Status: Alert, oriented, thought content appropriate.  Speech fluent without evidence of aphasia. Able to follow 3 step commands without difficulty. Cranial Nerves: II: Visual fields grossly normal,  III,IV, VI: ptosis not present, extra-ocular motions intact bilaterally, pupils equal, round, reactive to light and accommodation V,VII: smile symmetric, facial light touch sensation normal bilaterally VIII: hearing normal bilaterally IX,X: uvula rises symmetrically XI: bilateral shoulder shrug XII: midline tongue extension Motor: Right : Upper extremity  5/5    Left:     Upper extremity   5/5  Lower extremity   5/5     Lower extremity   5/5 Tone and bulk:normal tone throughout; no atrophy noted Sensory: Pinprick and light touch intact throughout, bilaterally Deep Tendon Reflexes: 2+ and symmetric throughout Plantars: Right: downgoing   Left: downgoing Cerebellar: normal finger-to-nose, normal rapid alternating movements and normal heel-to-shin test Gait: normal gait and station     Lab Results: Basic Metabolic Panel: Recent Labs  Lab 01/25/20 1356 01/25/20 1459  NA 135 139  K 4.8 3.4*  CL 96*  --   CO2 27  --   GLUCOSE 150*  --   BUN 80*  --   CREATININE 2.51*  --   CALCIUM 9.1  --     CBC: Recent Labs  Lab 01/25/20 1356 01/25/20 1459  WBC 13.2*  --   HGB 12.2* 13.3  HCT 40.9 39.0  MCV 80.2  --   PLT 219  --     Coagulation Studies: No results for input(s): LABPROT, INR in the last 72 hours.  Imaging: CT Head Wo Contrast  Result Date: 01/25/2020 CLINICAL DATA:  Altered mental status/confusion. Headache for several days EXAM: CT HEAD WITHOUT CONTRAST TECHNIQUE: Contiguous axial images were obtained from the base of the skull through the vertex without intravenous contrast. COMPARISON:  February 28, 2019 FINDINGS: Brain: There is mild diffuse atrophy. There is no intracranial mass. A mild amount of hemorrhage is noted in the dependent portion of each occipital horn  of the lateral ventricle. No other site of hemorrhage hemorrhage is seen. There is no appreciable extra-axial fluid or midline shift. There is small vessel disease throughout the centra semiovale region bilaterally. No acute appearing infarct is evident. Vascular: No hyperdense vessels are evident. There is calcification in the distal left vertebral artery and carotid siphon regions. Skull: Bony calvarium appears intact. Sinuses/Orbits: There is mucosal thickening in several ethmoid air cells. Other paranasal sinuses which are visualized are clear. Orbits appear symmetric bilaterally. Other: Mastoid air cells are clear. IMPRESSION: There is hemorrhage in the dependent portion of each lateral ventricle of uncertain etiology. No brain parenchymal hemorrhage elsewhere noted. No mass. No extra-axial fluid. There is mild atrophy with extensive periventricular small vessel disease. No acute appearing infarct is evident. There are multiple foci of arterial vascular calcification. There is mucosal thickening in several ethmoid air cells. Critical Value/emergent results were called by telephone at the time of interpretation on 01/25/2020 at 6:21 pm to provider Carmin Muskrat, who verbally acknowledged these results. Electronically Signed   By: Lowella Grip III M.D.   On: 01/25/2020 18:22   DG Chest Portable 1 View  Result Date: 01/25/2020 CLINICAL DATA:  Shortness of breath and chest pain. EXAM: PORTABLE CHEST 1 VIEW COMPARISON:  Chest x-ray dated May 02, 2019. FINDINGS: Unchanged right chest wall pacemaker. Unchanged cardiomegaly. Chronic pulmonary vascular congestion. Mild left basilar atelectasis. No focal consolidation, pleural effusion, or pneumothorax. No acute osseous abnormality. IMPRESSION: 1. No active disease. Chronic cardiomegaly and pulmonary vascular congestion. Electronically Signed   By: Titus Dubin M.D.   On: 01/25/2020 14:40   US Abdomen Limited RUQ  Result Date: 01/25/2020 CLINICAL  DATA:  Elevated LFTs EXAM: ULTRASOUND ABDOMEN LIMITED RIGHT UPPER QUADRANT COMPARISON:  04/24/2019 FINDINGS: Gallbladder: Well distended with gallbladder sludge and gallstones. No wall thickening or pericholecystic fluid is noted. Negative sonographic Murphy's sign is elicited. Common bile duct: Diameter: 2.8 mm Liver: Mild heterogeneity is  noted likely related to fatty infiltration. No focal mass is seen. Portal vein is patent on color Doppler imaging with normal direction of blood flow towards the liver. Other: Incidental note is made of cysts in the right kidney. The largest of these measures 4.5 cm. IMPRESSION: Gallbladder sludge and gallstones without complicating factors. Right renal cysts stable from prior CT examination. Electronically Signed   By: Inez Catalina M.D.   On: 01/25/2020 16:20     ASSESSMENT AND PLAN  74 y.o. male with past medical history significant for atrial fibrillation not on anticoagulation due to history of rectal bleeding, cardiomyopathy, coronary disease, hypertension, legally blind, diabetes type 2, hypertension, hyperlipidemia and morbid obesity with 3-day history of headache.  CT head shows clearing of blood in bilateral occipital lobe horns, there is also trauma or other conditions such as amyloid to be considered.  Also hypertension may also be a etiology, blood pressure on my assessment is in the 130s-therefore recommended medicine admission as patient having other complaints such as shortness of breath as well as work-up for syncope.  Layering of blood in bilateral occipital horns Headache  -Recommend MRI brain without contrast if PCM compatible, or repeat CT head in 6 hrs to make sure no increase in blood.  -MRA head as unable to do CTA due to renal function  no antiplatelet/anticoagulants  BP less than 062 systolic, as needed labetalol ordered.  Tramadol PRN for pain Frequent neurochecks  Syncope -Work-up per medicine service  Neurology will continue to  follow patient   Addendum -Called by resident stating blood pressure greater than 160.  Recommended addition of labetalol and continue home medications.  Discontinue all meds can cause rebound hypertension.  Follow back with nurse-blood pressure has remained below 160.  Alexsis Branscom [facility] Pager Number [phone]

## 2020-01-26 NOTE — Progress Notes (Signed)
HD#1 Subjective:  Overnight Events: admitted  [name] was seen during AM rounds. He appears somnolent and is only oriented to self. He complains of shortness of breath x 2 weeks but appears comfortable on 2L. He is not able to provide more information when we asked.   Objective:  Vital signs in last 24 hours: Vitals:   01/26/20 0154 01/26/20 0255 01/26/20 0311 01/26/20 0533  BP: (!) 157/85 (!) 155/85 (!) 155/84 (!) 157/71  Pulse:   67   Resp:   (!) 21   Temp:   98.5 F (36.9 C)   TempSrc:   Oral   SpO2:   95%   Weight:      Height:       Supplemental O2: Nasal Cannula SpO2: 95 % O2 Flow Rate (L/min): 2 L/min   Physical Exam:  Physical Exam Constitutional:      Appearance: Normal appearance.  HENT:     Head: Normocephalic and atraumatic.  Eyes:     Extraocular Movements: Extraocular movements intact.  Cardiovascular:     Rate and Rhythm: Normal rate.     Pulses: Normal pulses.     Heart sounds: Normal heart sounds.  Pulmonary:     Effort: Pulmonary effort is normal.     Breath sounds: Normal breath sounds.  Abdominal:     General: Bowel sounds are normal.     Palpations: Abdomen is soft.     Tenderness: There is no abdominal tenderness.  Musculoskeletal:        General: Normal range of motion.     Cervical back: Normal range of motion.     Right lower leg: No edema.     Left lower leg: No edema.  Skin:    General: Skin is warm and dry.  Neurological:     Mental Status: He is disoriented.     Comments: Patient appears sleepy and disorients. Can move all four extremities spontaneously, but does appear to have generalized weakness.   Psychiatric:        Mood and Affect: Mood normal.     Filed Weights   01/25/20 2146 01/26/20 0012  Weight: (!) 149.7 kg (!) 143.8 kg     Intake/Output Summary (Last 24 hours) at 01/26/2020 0618 Last data filed at 01/26/2020 0500 Gross per 24 hour  Intake 6.73 ml  Output 975 ml  Net -968.27 ml   Net IO Since  Admission: -968.27 mL [01/26/20 0618]  Pertinent Labs: CBC Latest Ref Rng & Units 01/26/2020 01/25/2020 01/25/2020  WBC 4.0 - 10.5 K/uL 11.4(H) - 13.2(H)  Hemoglobin 13.0 - 17.0 g/dL 12.1(L) 13.3 12.2(L)  Hematocrit 39 - 52 % 40.2 39.0 40.9  Platelets 150 - 400 K/uL 198 - 219    CMP Latest Ref Rng & Units 01/26/2020 01/25/2020 01/25/2020  Glucose 70 - 99 mg/dL 221(H) - 150(H)  BUN 8 - 23 mg/dL 75(H) - 80(H)  Creatinine 0.61 - 1.24 mg/dL 2.37(H) - 2.51(H)  Sodium 135 - 145 mmol/L 138 139 135  Potassium 3.5 - 5.1 mmol/L 3.5 3.4(L) 4.8  Chloride 98 - 111 mmol/L 95(L) - 96(L)  CO2 22 - 32 mmol/L 28 - 27  Calcium 8.9 - 10.3 mg/dL 9.3 - 9.1  Total Protein 6.5 - 8.1 g/dL 8.0 - 7.4  Total Bilirubin 0.3 - 1.2 mg/dL 0.6 - 1.5(H)  Alkaline Phos 38 - 126 U/L 131(H) - 143(H)  AST 15 - 41 U/L 52(H) - 89(H)  ALT 0 - 44 U/L 119(H) -  131(H)    Pending Labs:   Imaging: CT HEAD WO CONTRAST  Result Date: 01/26/2020 CLINICAL DATA:  Stroke follow-up EXAM: CT HEAD WITHOUT CONTRAST TECHNIQUE: Contiguous axial images were obtained from the base of the skull through the vertex without intravenous contrast. COMPARISON:  None. FINDINGS: Brain: There is no mass, hemorrhage or extra-axial collection. There is generalized atrophy without lobar predilection. Hypodensity of the white matter is most commonly associated with chronic microvascular disease. There is blood layering within the occipital horns of the lateral ventricles, unchanged. Vascular: Atherosclerotic calcification of the vertebral and internal carotid arteries at the skull base. No abnormal hyperdensity of the major intracranial arteries or dural venous sinuses. Skull: The visualized skull base, calvarium and extracranial soft tissues are normal. Sinuses/Orbits: No fluid levels or advanced mucosal thickening of the visualized paranasal sinuses. No mastoid or middle ear effusion. The orbits are normal. IMPRESSION: Unchanged intraventricular blood layering  within the occipital horns of the lateral ventricles. Electronically Signed   By: Ulyses Jarred M.D.   On: 01/26/2020 05:06   CT Head Wo Contrast  Result Date: 01/25/2020 CLINICAL DATA:  Altered mental status/confusion. Headache for several days EXAM: CT HEAD WITHOUT CONTRAST TECHNIQUE: Contiguous axial images were obtained from the base of the skull through the vertex without intravenous contrast. COMPARISON:  February 28, 2019 FINDINGS: Brain: There is mild diffuse atrophy. There is no intracranial mass. A mild amount of hemorrhage is noted in the dependent portion of each occipital horn of the lateral ventricle. No other site of hemorrhage hemorrhage is seen. There is no appreciable extra-axial fluid or midline shift. There is small vessel disease throughout the centra semiovale region bilaterally. No acute appearing infarct is evident. Vascular: No hyperdense vessels are evident. There is calcification in the distal left vertebral artery and carotid siphon regions. Skull: Bony calvarium appears intact. Sinuses/Orbits: There is mucosal thickening in several ethmoid air cells. Other paranasal sinuses which are visualized are clear. Orbits appear symmetric bilaterally. Other: Mastoid air cells are clear. IMPRESSION: There is hemorrhage in the dependent portion of each lateral ventricle of uncertain etiology. No brain parenchymal hemorrhage elsewhere noted. No mass. No extra-axial fluid. There is mild atrophy with extensive periventricular small vessel disease. No acute appearing infarct is evident. There are multiple foci of arterial vascular calcification. There is mucosal thickening in several ethmoid air cells. Critical Value/emergent results were called by telephone at the time of interpretation on 01/25/2020 at 6:21 pm to provider Carmin Muskrat, who verbally acknowledged these results. Electronically Signed   By: Lowella Grip III M.D.   On: 01/25/2020 18:22   DG Chest Portable 1 View  Result Date:  01/25/2020 CLINICAL DATA:  Shortness of breath and chest pain. EXAM: PORTABLE CHEST 1 VIEW COMPARISON:  Chest x-ray dated May 02, 2019. FINDINGS: Unchanged right chest wall pacemaker. Unchanged cardiomegaly. Chronic pulmonary vascular congestion. Mild left basilar atelectasis. No focal consolidation, pleural effusion, or pneumothorax. No acute osseous abnormality. IMPRESSION: 1. No active disease. Chronic cardiomegaly and pulmonary vascular congestion. Electronically Signed   By: Titus Dubin M.D.   On: 01/25/2020 14:40   US Abdomen Limited RUQ  Result Date: 01/25/2020 CLINICAL DATA:  Elevated LFTs EXAM: ULTRASOUND ABDOMEN LIMITED RIGHT UPPER QUADRANT COMPARISON:  04/24/2019 FINDINGS: Gallbladder: Well distended with gallbladder sludge and gallstones. No wall thickening or pericholecystic fluid is noted. Negative sonographic Murphy's sign is elicited. Common bile duct: Diameter: 2.8 mm Liver: Mild heterogeneity is noted likely related to fatty infiltration. No focal mass is seen.  Portal vein is patent on color Doppler imaging with normal direction of blood flow towards the liver. Other: Incidental note is made of cysts in the right kidney. The largest of these measures 4.5 cm. IMPRESSION: Gallbladder sludge and gallstones without complicating factors. Right renal cysts stable from prior CT examination. Electronically Signed   By: Inez Catalina M.D.   On: 01/25/2020 16:20    Assessment/Plan:   Principal Problem:   ICH (intracerebral hemorrhage) ([place])    Patient Summary: Patient is a 74 year old male with past medical history significant for prior intracranial hemorrhage, HFpEF, CKD stage IV, hypertension, diabetes mellitus who presented for evaluation of headache and was found to have bilateral intraventricular hemorrhage.  # Bilateral intraventricular hemorrhage # Hypertension - Maintain SBP< 160, prn labetalol  - Continue hydralazine 100 mg TID, amlodipine 10 mg - MRI pending -  PT/OT - Speech swallow eval - Appreciate neurologies assistance.  # HFpEF - Torsemide 100 mg BID - Replete potassium  # T2DM - Continue antihypertensive management.  Diet: full liquids IVF: None,None VTE: SCDs Code: Full PT/OT recs: Pending TOC recs: pending   Dispo: Anticipated discharge pending work up.    Marianna Payment, D.O. [facility], PGY-1 Date 01/26/2020 Time 6:18 AM Pager: [phone] Please contact the on call pager after 5 pm and on weekends at [phone].

## 2020-01-26 NOTE — Progress Notes (Signed)
MRI called and said patient's pacemaker is incompatible with either MR/MRA and that exams cannot be performed.  Paged Dr. Arrie Eastern, Md to notify.

## 2020-01-27 ENCOUNTER — Inpatient Hospital Stay (HOSPITAL_COMMUNITY): Payer: Medicare (Managed Care)

## 2020-01-27 DIAGNOSIS — R7989 Other specified abnormal findings of blood chemistry: Secondary | ICD-10-CM

## 2020-01-27 DIAGNOSIS — R1312 Dysphagia, oropharyngeal phase: Secondary | ICD-10-CM

## 2020-01-27 DIAGNOSIS — R55 Syncope and collapse: Secondary | ICD-10-CM

## 2020-01-27 DIAGNOSIS — I480 Paroxysmal atrial fibrillation: Secondary | ICD-10-CM

## 2020-01-27 DIAGNOSIS — G9341 Metabolic encephalopathy: Secondary | ICD-10-CM

## 2020-01-27 DIAGNOSIS — Z950 Presence of cardiac pacemaker: Secondary | ICD-10-CM

## 2020-01-27 DIAGNOSIS — E1165 Type 2 diabetes mellitus with hyperglycemia: Secondary | ICD-10-CM

## 2020-01-27 DIAGNOSIS — I161 Hypertensive emergency: Secondary | ICD-10-CM

## 2020-01-27 DIAGNOSIS — I6389 Other cerebral infarction: Secondary | ICD-10-CM

## 2020-01-27 DIAGNOSIS — N184 Chronic kidney disease, stage 4 (severe): Secondary | ICD-10-CM

## 2020-01-27 DIAGNOSIS — S06360A Traumatic hemorrhage of cerebrum, unspecified, without loss of consciousness, initial encounter: Secondary | ICD-10-CM

## 2020-01-27 DIAGNOSIS — E1122 Type 2 diabetes mellitus with diabetic chronic kidney disease: Secondary | ICD-10-CM

## 2020-01-27 DIAGNOSIS — R739 Hyperglycemia, unspecified: Secondary | ICD-10-CM

## 2020-01-27 LAB — HEPATIC FUNCTION PANEL
ALT: 83 U/L — ABNORMAL HIGH (ref 0–44)
AST: 31 U/L (ref 15–41)
Albumin: 3.1 g/dL — ABNORMAL LOW (ref 3.5–5.0)
Alkaline Phosphatase: 121 U/L (ref 38–126)
Bilirubin, Direct: <0.1 mg/dL (ref 0.0–0.2)
Total Bilirubin: 0.6 mg/dL (ref 0.3–1.2)
Total Protein: 8 g/dL (ref 6.5–8.1)

## 2020-01-27 LAB — BASIC METABOLIC PANEL
Anion gap: 14 (ref 5–15)
BUN: 77 mg/dL — ABNORMAL HIGH (ref 8–23)
CO2: 30 mmol/L (ref 22–32)
Calcium: 9 mg/dL (ref 8.9–10.3)
Chloride: 95 mmol/L — ABNORMAL LOW (ref 98–111)
Creatinine, Ser: 2.66 mg/dL — ABNORMAL HIGH (ref 0.61–1.24)
GFR calc Af Amer: 27 mL/min — ABNORMAL LOW (ref >60)
GFR calc non Af Amer: 23 mL/min — ABNORMAL LOW (ref >60)
Glucose, Bld: 225 mg/dL — ABNORMAL HIGH (ref 70–99)
Potassium: 3.4 mmol/L — ABNORMAL LOW (ref 3.5–5.1)
Sodium: 139 mmol/L (ref 135–145)

## 2020-01-27 LAB — GLUCOSE, CAPILLARY
Glucose-Capillary: 239 mg/dL — ABNORMAL HIGH (ref 70–99)
Glucose-Capillary: 232 mg/dL — ABNORMAL HIGH (ref 70–99)
Glucose-Capillary: 203 mg/dL — ABNORMAL HIGH (ref 70–99)
Glucose-Capillary: 184 mg/dL — ABNORMAL HIGH (ref 70–99)
Glucose-Capillary: 199 mg/dL — ABNORMAL HIGH (ref 70–99)

## 2020-01-27 LAB — ECHOCARDIOGRAM COMPLETE
Height: 74 in
Weight: 5044.12 oz

## 2020-01-27 LAB — CBC
HCT: 38.3 % — ABNORMAL LOW (ref 39.0–52.0)
Hemoglobin: 11.2 g/dL — ABNORMAL LOW (ref 13.0–17.0)
MCH: 23.5 pg — ABNORMAL LOW (ref 26.0–34.0)
MCHC: 29.2 g/dL — ABNORMAL LOW (ref 30.0–36.0)
MCV: 80.3 fL (ref 80.0–100.0)
Platelets: 192 10*3/uL (ref 150–400)
RBC: 4.77 MIL/uL (ref 4.22–5.81)
RDW: 16.1 % — ABNORMAL HIGH (ref 11.5–15.5)
WBC: 12.2 10*3/uL — ABNORMAL HIGH (ref 4.0–10.5)
nRBC: 0 % (ref 0.0–0.2)

## 2020-01-27 LAB — BLOOD GAS, ARTERIAL
Acid-Base Excess: 8.4 mmol/L — ABNORMAL HIGH (ref 0.0–2.0)
Bicarbonate: 33.1 mmol/L — ABNORMAL HIGH (ref 20.0–28.0)
Drawn by: 53309
FIO2: 32
O2 Saturation: 91.4 %
Patient temperature: 36.9
pCO2 arterial: 51.9 mmHg — ABNORMAL HIGH (ref 32.0–48.0)
pH, Arterial: 7.42 (ref 7.350–7.450)
pO2, Arterial: 61.9 mmHg — ABNORMAL LOW (ref 83.0–108.0)

## 2020-01-27 LAB — AMMONIA: Ammonia: 23 umol/L (ref 9–35)

## 2020-01-27 LAB — LIPID PANEL
Cholesterol: 103 mg/dL (ref 0–200)
HDL: 31 mg/dL — ABNORMAL LOW (ref >40)
LDL Cholesterol: 58 mg/dL (ref 0–99)
Total CHOL/HDL Ratio: 3.3 RATIO
Triglycerides: 68 mg/dL (ref <150)
VLDL: 14 mg/dL (ref 0–40)

## 2020-01-27 LAB — GAMMA GT: GGT: 95 U/L — ABNORMAL HIGH (ref 7–50)

## 2020-01-27 MED ORDER — FUROSEMIDE 10 MG/ML IJ SOLN
100.0000 mg | Freq: Two times a day (BID) | INTRAVENOUS | Status: DC
  Administered 2020-01-27: 100 mg via INTRAVENOUS
  Filled 2020-01-27 (×3): qty 10

## 2020-01-27 MED ORDER — SODIUM CHLORIDE 0.9 % IV SOLN
500.0000 mg | INTRAVENOUS | Status: DC
  Administered 2020-01-27 – 2020-01-28 (×2): 500 mg via INTRAVENOUS
  Filled 2020-01-27 (×2): qty 500

## 2020-01-27 MED ORDER — METOLAZONE 2.5 MG PO TABS
2.5000 mg | ORAL_TABLET | Freq: Once | ORAL | Status: DC
  Filled 2020-01-27: qty 1

## 2020-01-27 MED ORDER — PROCHLORPERAZINE EDISYLATE 10 MG/2ML IJ SOLN: 10.0000 mg | Freq: Four times a day (QID) | INTRAMUSCULAR | Status: DC | PRN

## 2020-01-27 MED ORDER — SODIUM CHLORIDE 0.9 % IV SOLN
1.0000 g | INTRAVENOUS | Status: DC
  Administered 2020-01-27 – 2020-01-28 (×2): 1 g via INTRAVENOUS
  Filled 2020-01-27 (×2): qty 10

## 2020-01-27 MED ORDER — METOPROLOL TARTRATE 50 MG PO TABS: 50.0000 mg | ORAL_TABLET | Freq: Two times a day (BID) | ORAL | Status: DC

## 2020-01-27 MED ORDER — INSULIN GLARGINE 100 UNIT/ML ~~LOC~~ SOLN
20.0000 [IU] | Freq: Every day | SUBCUTANEOUS | Status: DC
  Administered 2020-01-27: 20 [IU] via SUBCUTANEOUS
  Filled 2020-01-27 (×2): qty 0.2

## 2020-01-27 MED ORDER — SODIUM CHLORIDE 0.9 % IV SOLN
8.0000 mg | Freq: Three times a day (TID) | INTRAVENOUS | Status: DC | PRN
  Filled 2020-01-27: qty 4

## 2020-01-27 MED ORDER — METOPROLOL SUCCINATE ER 50 MG PO TB24: 50.0000 mg | ORAL_TABLET | Freq: Every day | ORAL | Status: DC

## 2020-01-27 MED ORDER — PERFLUTREN LIPID MICROSPHERE
INTRAVENOUS | Status: AC
  Administered 2020-01-27: 3 mL
  Filled 2020-01-27: qty 10

## 2020-01-27 MED ORDER — SODIUM CHLORIDE 0.9 % IV SOLN: INTRAVENOUS | Status: DC

## 2020-01-27 MED ORDER — PERFLUTREN LIPID MICROSPHERE
1.0000 mL | INTRAVENOUS | Status: AC | PRN
  Filled 2020-01-27: qty 10

## 2020-01-27 NOTE — Progress Notes (Signed)
PT Cancellation Note  Patient Details Name: Job Holtsclaw MRN: [id number] DOB: 02-Feb-1946   Cancelled Treatment:    Reason Eval/Treat Not Completed: Fatigue/lethargy limiting ability to participate  See prior eval by SLP with pt very lethargic. RN notes patient too lethargic to take his meds.   Will attempt PT eval 01/28/20 as appropriate.   Arby Barrette, PT Pager [phone]  Rexanne Mano 01/27/2020, 4:06 PM

## 2020-01-27 NOTE — Progress Notes (Signed)
HD#2 Subjective:  Overnight Events: No overnight events.  [name] was seen this morning. He is very sleepy, but will wake up to physical and verbal stimuli. He does require significant prompting to get him to respond. He will only respond in one word sentences and it is difficult to determine if his answers are appropriate. Can move all four extremities spontaneously but is fairly weak. Does appear to have confusion and is not oriented today. Difficult to access change in baseline cognitive status or focal neurologic deficits, but grossly he appears more altered and lethargic take form yesterday.   Objective:  Vital signs in last 24 hours: Vitals:   01/26/20 2003 01/26/20 2337 01/27/20 0334 01/27/20 0428  BP: (!) 149/72 (!) 144/75 140/69   Pulse: 76 (!) 34 80   Resp: (!) 22 (!) 24 (!) 22   Temp: 98.6 F (37 C) 97.9 F (36.6 C) 97.9 F (36.6 C)   TempSrc: Oral Axillary Axillary   SpO2: 91% 100% 96%   Weight:    (!) 143 kg  Height:       Supplemental O2: Nasal Cannula SpO2: 96 % O2 Flow Rate (L/min): 3 L/min   Physical Exam:  Physical Exam Constitutional:      Appearance: He is obese.     Comments: Sleepy appearing   HENT:     Head: Normocephalic and atraumatic.  Cardiovascular:     Rate and Rhythm: Normal rate.     Pulses: Normal pulses.  Pulmonary:     Effort: Tachypnea present.     Comments: Difficult to auscultate lungs due to body habitus  Abdominal:     General: Bowel sounds are normal.     Palpations: Abdomen is soft.     Tenderness: There is no abdominal tenderness.  Musculoskeletal:        General: No swelling. Normal range of motion.     Cervical back: Normal range of motion.  Skin:    General: Skin is warm and dry.  Neurological:     Mental Status: He is disoriented and confused.     Motor: Weakness present.     Filed Weights   01/25/20 2146 01/26/20 0012 01/27/20 0428  Weight: (!) 149.7 kg (!) 143.8 kg (!) 143 kg     Intake/Output  Summary (Last 24 hours) at 01/27/2020 0525 Last data filed at 01/26/2020 1607 Gross per 24 hour  Intake 120 ml  Output 950 ml  Net -830 ml   Net IO Since Admission: -1,798.27 mL [01/27/20 0525]  Pertinent Labs: CBC Latest Ref Rng & Units 01/27/2020 01/26/2020 01/25/2020  WBC 4.0 - 10.5 K/uL 12.2(H) 11.4(H) -  Hemoglobin 13.0 - 17.0 g/dL 11.2(L) 12.1(L) 13.3  Hematocrit 39 - 52 % 38.3(L) 40.2 39.0  Platelets 150 - 400 K/uL 192 198 -    CMP Latest Ref Rng & Units 01/27/2020 01/26/2020 01/25/2020  Glucose 70 - 99 mg/dL 225(H) 221(H) -  BUN 8 - 23 mg/dL 77(H) 75(H) -  Creatinine 0.61 - 1.24 mg/dL 2.66(H) 2.37(H) -  Sodium 135 - 145 mmol/L 139 138 139  Potassium 3.5 - 5.1 mmol/L 3.4(L) 3.5 3.4(L)  Chloride 98 - 111 mmol/L 95(L) 95(L) -  CO2 22 - 32 mmol/L 30 28 -  Calcium 8.9 - 10.3 mg/dL 9.0 9.3 -  Total Protein 6.5 - 8.1 g/dL - 8.0 -  Total Bilirubin 0.3 - 1.2 mg/dL - 0.6 -  Alkaline Phos 38 - 126 U/L - 131(H) -  AST 15 -  41 U/L - 52(H) -  ALT 0 - 44 U/L - 119(H) -    Pending Labs: none  Imaging: CT head wo contrast pending CXR pending   Assessment/Plan:   Principal Problem:   ICH (intracerebral hemorrhage) ([place])   Patient Summary: Patient is a 74 year old male with past medical history significant for prior intracranial hemorrhage, HFpEF, CKD stage IV, hypertension, diabetes mellitus who presented for evaluation of headache and was found to havebilateralintraventricular hemorrhage.  Patient has worsening cognitive status today. MRI cannot be performed due to pace maker. Will restart some HF/HTN medications today.   # Bilateral intraventricular hemorrhage - MRI cannot be performed due to pace maker - Repeat CT scan due to worsening mental status - PT/OT - Speech swallow eval - Appreciate neurologies assistance.  #SHOB: - Patient appears tachypnic - ABG shows normal pH with mild hypercarbia - Will get CXR and start BIPAP for 30-1 hours.  # Hypertension -  Continue Hydral 100 mg TID, amlodipine 10 mg. - Restart metoprolol 50 mg  # HFpEF - Torsemide 100 mg BID - Replete potassium  # T2DM - Continue antihypertensive management.  Diet: full liquids IVF: None,None VTE: SCDs Code: Full PT/OT recs: Pending TOC recs: pending  Dispo: Anticipated discharge pending further evaluation and management .   Marianna Payment, D.O. [facility], PGY-1 Date 01/27/2020 Time 5:25 AM Pager: [phone] Please contact the on call pager after 5 pm and on weekends at [phone].

## 2020-01-27 NOTE — Progress Notes (Signed)
  Echocardiogram 2D Echocardiogram has been performed.  Suzanne Garbers G Jakota Manthei 01/27/2020, 11:15 AM

## 2020-01-27 NOTE — Progress Notes (Signed)
Throughout the course of the day the patient has been more altered since yesterday evening. I spoke to Dr. Erlinda Hong with neurology and agree that the patient's AMS is out of proportion for his INH. I got speech to evaluate the patient for aspiration risk and agree that the patient should remain NPO at this time. I got a CXR today showing signs concerning for aspiration and started the patient on ceftriaxone and azithromycin. ABG/metabolic panel showed mildly elevated bicarb and CO2 with normal pH. Patient has new onset transaminase elevations concerning for liver injury and a 0.3 elevation in creatinine conserning for acute on chronic kidney disease.   Today's Vitals   01/27/20 1210 01/27/20 1700 01/27/20 1713 01/27/20 1737  BP: (!) 122/53 (!) 128/55  125/63  Pulse: 74 75 80 79  Resp: 20 (!) 26 19 (!) 28  Temp: 98.4 F (36.9 C) 98 F (36.7 C) 98 F (36.7 C) 98 F (36.7 C)  TempSrc: Axillary Axillary    SpO2: 94% 93% 92% 91%  Weight:      Height:      PainSc: 0-No pain      Body mass index is 40.48 kg/m.   On evaluation patient is mildly tachypnic and maintaining saturations at 93% on 3L [place]. He does appear to have increased work of breathing, but not frank respiratory distress. Otherwise he will wake up to physical and verbal stimulibut has difficult time staying away to answer questions. I am unable to determine if the patient is oriented at this time.    Plan: - CPAP tonight unless patient get nauseated/vomits - Repeat CT wo contrast in the AM for ICH - EEG tonight  - Work up acute hepatocellular damage to liver. (Heptic panel, acute hepatitis panel GGT, etc.) - if patient is still significantly altered tomorrow, than he will need NG tube/Cortrak placed tomorrow.  - Continue antibiotics for aspiration pneumonia.  - Patient has not had goo NPO intake since his presentation, which is likely causing his acute worsening of his kidney function, we will continue to monitor this closely.   - If  patient clinically deteriorates, get stat ABG and page on call team.   Marianna Payment, D.O. [facility], PGY-1 Pager: [phone], Phone: [phone] Date 01/27/2020 Time 6:38 PM   *Please contact the on call pager after 5 pm and on weekends at [phone].*

## 2020-01-27 NOTE — Progress Notes (Signed)
Pt is too lethargic to take po medications this afternoon.

## 2020-01-27 NOTE — Progress Notes (Signed)
STROKE TEAM PROGRESS NOTE   INTERVAL HISTORY RN is not at bedside. He is sleepy again today, hard to arouse, able to mumbling and moving hands to hold onto bed rail but not following any commands. No sedation was given overnight. Primary team has work-up with ABG, head CT and CXR without clear source. Put on Abx for likely aspiration pneumonia. EEG ordered.    OBJECTIVE Vitals:   01/26/20 2003 01/26/20 2337 01/27/20 0334 01/27/20 0428  BP: (!) 149/72 (!) 144/75 140/69   Pulse: 76 (!) 34 80   Resp: (!) 22 (!) 24 (!) 22   Temp: 98.6 F (37 C) 97.9 F (36.6 C) 97.9 F (36.6 C)   TempSrc: Oral Axillary Axillary   SpO2: 91% 100% 96%   Weight:    (!) 143 kg  Height:        CBC:  Recent Labs  Lab 01/26/20 0029 01/27/20 0317  WBC 11.4* 12.2*  HGB 12.1* 11.2*  HCT 40.2 38.3*  MCV 79.3* 80.3  PLT 198 299    Basic Metabolic Panel:  Recent Labs  Lab 01/26/20 0029 01/27/20 0317  NA 138 139  K 3.5 3.4*  CL 95* 95*  CO2 28 30  GLUCOSE 221* 225*  BUN 75* 77*  CREATININE 2.37* 2.66*  CALCIUM 9.3 9.0    Lipid Panel:     Component Value Date/Time   CHOL 103 01/27/2020 0317   CHOL 129 01/05/2017 1235   TRIG 68 01/27/2020 0317   HDL 31 (L) 01/27/2020 0317   HDL 36 (L) 01/05/2017 1235   CHOLHDL 3.3 01/27/2020 0317   VLDL 14 01/27/2020 0317   LDLCALC 58 01/27/2020 0317   LDLCALC 71 01/05/2017 1235   HgbA1c:  Lab Results  Component Value Date   HGBA1C 8.9 (H) 01/26/2020   Urine Drug Screen:     Component Value Date/Time   LABOPIA NONE DETECTED 10/01/2013 1915   COCAINSCRNUR NONE DETECTED 10/01/2013 1915   COCAINSCRNUR NEG 12/16/2008 2246   LABBENZ NONE DETECTED 10/01/2013 1915   LABBENZ NEG 12/16/2008 2246   AMPHETMU NONE DETECTED 10/01/2013 1915   THCU NONE DETECTED 10/01/2013 1915   LABBARB NONE DETECTED 10/01/2013 1915    Alcohol Level     Component Value Date/Time   ETH <11 10/01/2013 1504    IMAGING  CT HEAD WO CONTRAST 01/26/2020 IMPRESSION:   Unchanged intraventricular blood layering within the occipital horns of the lateral ventricles.   CT Head Wo Contrast 01/25/2020 IMPRESSION:  There is hemorrhage in the dependent portion of each lateral ventricle of uncertain etiology. No brain parenchymal hemorrhage elsewhere noted. No mass. No extra-axial fluid. There is mild atrophy with extensive periventricular small vessel disease. No acute appearing infarct is evident. There are multiple foci of arterial vascular calcification. There is mucosal thickening in several ethmoid air cells.   DG Chest Portable 1 View 01/25/2020 IMPRESSION:  No active disease. Chronic cardiomegaly and pulmonary vascular congestion.   US Abdomen Limited RUQ 01/25/2020 IMPRESSION:  Gallbladder sludge and gallstones without complicating factors. Right renal cysts stable from prior CT examination.    ECG - paced - rate 69 BPM.    PHYSICAL EXAM  Temp:  [97.9 F (36.6 C)-98.6 F (37 C)] 98.4 F (36.9 C) (06/13 1210) Pulse Rate:  [34-80] 74 (06/13 1210) Resp:  [20-24] 20 (06/13 1210) BP: (122-160)/(53-84) 122/53 (06/13 1210) SpO2:  [91 %-100 %] 94 % (06/13 1210) Weight:  [143 kg] 143 kg (06/13 0428)  General - Well nourished, well developed,  drowsy and hard to arouse.  Ophthalmologic - fundi not visualized due to noncooperation.  Cardiovascular - Regular rhythm and rate.  Neuro - drowsy sleepy and hard to arouse, with painful stimulation, he barely opens eyes. He was moaning on pain but intangible, not following commands. Did not name or repeat. Bilateral eyes s/p surgery, no identified pupils bilaterally. Eye mid position, slow doll's eyes. Positive gag cough and corneal. No clear facial droop. Tongue protrusion not cooperative. Not blinking to visual threat with eyes forced opening. When lifting b/l arms, they slowly drift to bed. No movement at b/l legs with pain stimulation. DTR 1+ and no babinski. Sensation, coordination and gait not  tested.   ASSESSMENT/PLAN [name] is a 74 y.o. male with history of atrial fibrillation not on anticoagulation due to history of rectal bleeding (on ASA), cardiomyopathy, PPM, coronary disease, hypertension, legally blind, diabetes type 2, hypertension, hyperlipidemia and morbid obesity  presenting with nausea, vomiting, headache, recent fall, recent syncope, and AMS. He did not receive IV t-PA due to [place].  [name] - bilateral lateral ventricle small [name], etiology unclear, could be traumatic due to fall   CT Head - 01/25/20 - There is hemorrhage in the dependent portion of each lateral ventricle of uncertain etiology.   CT head - 01/26/20 - Unchanged intraventricular blood layering within the occipital horns of the lateral ventricles.   CT head 01/27/20 - slight increase of [name], borderline increase in ventriculomegaly  MRI head - not able to do due to incompatible PPM   Carotid Doppler - unremarkable  2D Echo - EF 60-65%  [employer] Virus 2 - negative  LDL - 58  HgbA1c - 8.9  VTE prophylaxis - SCDs  aspirin 81 mg daily prior to admission, now on No antithrombotic due to [name]  Ongoing aggressive stroke risk factor management  Therapy recommendations:  pending  Disposition:  Pending  Metabolic encephalopathy  Pt sleepy drowsy out of proportion for brain pathology  Etiology unclear  Not on sedation  Elevated LFT - AST/ALT 52/119 -> pending  Elevated ammonia - 42-> pending  CKD IV with elevated BUN 75->77-> pending and Cre 2.37->2.66-> pending  EEG pending  Management per primary team  Hypertension  Home BP meds: amlodipine ; apresoline ; metoprolol ; Demadex ; Zaroxolyn   Current BP meds: amlodipine ; hydralazine ; Demadex; metoprolol  Stable . SBP goal < 140 mm Hg . Long-term BP goal normotensive  Hyperlipidemia  Home Lipid lowering medication: Crestor 20 mg daily  LDL 58, goal < 70  Hold statin for now due to elevated LFT and [name]  Consider  to resume statin at discharge  Diabetes  Home diabetic meds: insulin  Current diabetic meds: lantus  HgbA1c 8.9, goal < 7.0  Hyperglycemia  SSI  CBG monitoring  Close PCP follow-up for better DM control  PAF  Not on AC due to rectal bleeding in the past  Not AC candidate now due to [name]  On ASA PTA  Dysphagia  Not able to pass swallow due to altered mental status  Currently n.p.o.  May consider cortrak if not passing swallow in the morning  Speech on board  Other Stroke Risk Factors  Advanced age  Former cigarette smoker - quit  ETOH use, advised to drink no more than 1 alcoholic beverage per day.  Obesity, Body mass index is 40.48 kg/m., recommend weight loss, diet and exercise as appropriate   Coronary artery disease  Obstructive sleep apnea, on CPAP at  home  Congestive Heart Failure s/p pacer  Other Active Problems  Code status - Full code  Legally blind - b/l eye surgery  PPM for bradycardia  Hospital day # 2  Patient condition worsened within the last 24 hours, has developed drowsy, sleepy, altered mental status, continues to have [name] with slight increase on repeat CT, and I have adjusted BP meds, ordered CBC, BMP, LFT and ammonia level repeat in a.m. I discussed with internal medicine primary team. I spent  35 minutes in total face-to-face time with the patient, more than 50% of which was spent in counseling and coordination of care, reviewing test results, images and medication, and discussing the diagnosis, treatment plan and potential prognosis. This patient's care requiresreview of multiple databases, neurological assessment, discussion with family, other specialists and medical decision making of high complexity.  Rosalin Hawking, MD PhD Stroke Neurology 01/27/2020 5:35 PM    To contact Stroke Continuity provider, please refer to [email or web address] After hours, contact General Neurology

## 2020-01-27 NOTE — Evaluation (Signed)
Clinical/Bedside Swallow Evaluation Patient Details  Name: [name] MRN: [id number] Date of Birth: 14-Oct-1945  Today's Date: 01/27/2020 Time: SLP Start Time (ACUTE ONLY): 1425 SLP Stop Time (ACUTE ONLY): 5784 SLP Time Calculation (min) (ACUTE ONLY): 12 min  Past Medical History:  Past Medical History:  Diagnosis Date  . Arthritis    "all over" (11-Jul-2013)  . Atrial fibrillation ([place])    not Candidate for Coumadin d/t rectal bleeding  . Benign prostatic hypertrophy   . Cardiomyopathy, hypertensive ([place])     diastolic dysfunction by 6962 echo  . Coronary artery disease     nonobstructive. 2 vessel. cardiac catheter in 2003-11-24 showing 60% LAD occlusion, 30% circumflex occlusion.  . Degenerative joint disease     Right knee.  . Diabetes type 2, uncontrolled ([place])   . Diabetic peripheral neuropathy ([place])   . Diabetic ulcer of thigh ([place])    H/O Rt thigh ulcer.  . Exertional shortness of breath    "comes and goes" (July 11, 2013)  . GERD (gastroesophageal reflux disease)   . Gout   . Herpes   . Hyperlipidemia   . Hypertension   . Hypotension 09/05/2013  . Incarcerated ventral hernia    H/O. S/P repair ventral hernia repair 07/2005.  Marland Kitchen LBBB (left bundle branch block)   . Legally blind   . Morbid obesity ([place])   . Obstructive sleep apnea    On CPAP. Last sleep study (06/2009) - Severe obstructive sleep apnea/hypopnea syndrome, apnea-hypopnea index 70.5 per hour with non positional events moderately loud snoring and oxygen desaturation to a nadir of 85% on room air.  . Occult blood positive stool     History of in August 2007.  colonoscopy in January 2008 showing normal colon with fair inadequate prep. By Dr. Silvano Rusk.  . Pacemaker November 24, 2003    secondary to bradycardia  . Sepsis ([place]) 08/26/2013   Past Surgical History:  Past Surgical History:  Procedure Laterality Date  . AMPUTATION Left 06/22/2013   Procedure: AMPUTATION FOOT;  Surgeon: Newt Minion, MD;  Location: [facility];  Service: Orthopedics;  Laterality: Left;  Left Midfoot Amputation  . CARDIAC CATHETERIZATION  10/22/2003   Dilatation of the LV with preserved LV systolic function. Tortous aorta, but no AAA. Moderate disease with 50-60% area of narrowing in the circumflex after the second OM and 70% mid narrowing in the LAD. No intervention.  Marland Kitchen CARDIOVASCULAR STRESS TEST  01/23/2010   Perfusion defect seen in inferior myocardial region is consistent with diaphragmatic attenuation. Remaining myocardium demonstrates normal myocardial perfusion with no evidence of ischemia or infarct. Stress ECG was non-diagnostic due to LBBB.  Marland Kitchen CATARACT EXTRACTION, BILATERAL Bilateral   . ESOPHAGOGASTRODUODENOSCOPY N/A 10/12/2013   Procedure: ESOPHAGOGASTRODUODENOSCOPY (EGD);  Surgeon: Gwenyth Ober, MD;  Location: [facility];  Service: General;  Laterality: N/A;  bedside  . EYE SURGERY    . 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 HERNIA REPAIR    . INSERT / REPLACE / REMOVE PACEMAKER  24-Nov-2003   Secondary to symptomatic bradycardia. DDDR pacemaker. By Dr. Rollene Fare.  Randolm Idol / REPLACE / REMOVE PACEMAKER  Nov 23, 2012   "old pacemaker died; had new one put in" (11-Jul-2013)  . Pacemaker lead revision  12/2003    likely microdislodgment  of right ventricular lead  . PEG PLACEMENT N/A 10/12/2013   Procedure: PERCUTANEOUS ENDOSCOPIC GASTROSTOMY (PEG) PLACEMENT;  Surgeon: Gwenyth Ober, MD;  Location: [facility];  Service: General;  Laterality: N/A;  .  PERMANENT PACEMAKER GENERATOR CHANGE N/A 11/03/2012   Procedure: PERMANENT PACEMAKER GENERATOR CHANGE;  Surgeon: Sanda Klein, MD;  Location: [facility] CATH LAB;  Service: Cardiovascular;  Laterality: N/A;  . REFRACTIVE SURGERY Bilateral   . RIGHT HEART CATH N/A 09/05/2018   Procedure: RIGHT HEART CATH;  Surgeon: Larey Dresser, MD;  Location: [facility] CV LAB;  Service: Cardiovascular;  Laterality: N/A;  . RIGHT/LEFT HEART CATH AND CORONARY  ANGIOGRAPHY N/A 04/10/2019   Procedure: RIGHT/LEFT HEART CATH AND CORONARY ANGIOGRAPHY;  Surgeon: [name], MD;  Location: [facility] CV LAB;  Service: Cardiovascular;  Laterality: N/A;  . TEE WITHOUT CARDIOVERSION N/A 09/05/2018   Procedure: TRANSESOPHAGEAL ECHOCARDIOGRAM (TEE);  Surgeon: Larey Dresser, MD;  Location: [facility] ENDOSCOPY;  Service: Cardiovascular;  Laterality: N/A;  . TEE WITHOUT CARDIOVERSION N/A 05/08/2019   Procedure: TRANSESOPHAGEAL ECHOCARDIOGRAM (TEE);  Surgeon: [name], MD;  Location: [place] CV LAB;  Service: Open Heart Surgery;  Laterality: N/A;  . TRANSCATHETER AORTIC VALVE REPLACEMENT, TRANSFEMORAL N/A 05/08/2019   Procedure: TRANSCATHETER AORTIC VALVE REPLACEMENT, TRANSFEMORAL;  Surgeon: [name], MD;  Location: [facility] [facility] CV LAB;  Service: Open Heart Surgery;  Laterality: N/A;  . TRANSTHORACIC ECHOCARDIOGRAM  01/01/2013   EF 50-55%, ventricular septum-thickness moderately increased  . VENOUS DOPPLER  01/17/2013   No evidence of thrombus or thrombophlebitis. Left GSVs appear enlarged and demonstrate valvular insuffiiciency. Bilateral SSVs appeared patent with no valvular insufficiency seen.  Marland Kitchen VENTRAL HERNIA REPAIR  07/2005    S/P laparotomy, lysis of adhesions, repair of incarcerated ventral hernia with  partial omentectomy.. Performed by Dr. Marlou Starks.   HPI:  Mr. Neis is a 74 year old community dwelling gentleman with prior intracranial hemorrhage and blindness who presented with 3 days of headaches which are bifrontal and nonradiating.  Head CT reported: "Increased but still small volume intraventricular hemorrhage layering in the occipital horns of the lateral ventricles." Unable to perform MRI.  CXR reported opacity in the left lung base consistent with PNA.    Assessment / Plan / Recommendation Clinical Impression  Pt was seen for a bedside swallow evaluation,  He was encountered asleep in bed and he did not consistently rouse to max verbal or  tactile stimulation.  He verbalized in response to verbal stimulation x1 and he was noted to have some labial movement when ice chip was placed on his inferior labial surface, but he did not rouse otherwise.  1/2 tsp of thin liquid was administered and the pt accepted the bolus passively into his oral cavity.  Labial seal and lingual manipulation were not attempted.  Anterior labial spillage was observed on the right and left sides of his mouth.  Remaining bolus was held in the pt's oral cavity without attempt at swallow initiation.  Bolus was eventually suctioned from the pt's oral cavity secondary to aspiration concerns.  Recommend continuation of strict NPO with medications administered via alternative means.  SLP will f/u to determine readiness for clinical diet initiation.    SLP Visit Diagnosis: Dysphagia, unspecified (R13.10)    Aspiration Risk  Moderate aspiration risk    Diet Recommendation NPO   Medication Administration: Via alternative means    Other  Recommendations Oral Care Recommendations: Oral care QID;Staff/trained caregiver to provide oral care Other Recommendations: Remove water pitcher   Follow up Recommendations Other (comment) (TBD)      Frequency and Duration min 2x/week  2 weeks       Prognosis Prognosis for Safe Diet Advancement: Good Barriers to Reach Goals:  (Lethargy )  Swallow Study   General HPI: Mr. Dauria is a 74 year old community dwelling gentleman with prior intracranial hemorrhage and blindness who presented with 3 days of headaches which are bifrontal and nonradiating.  Head CT reported: "Increased but still small volume intraventricular hemorrhage layering in the occipital horns of the lateral ventricles." Unable to perform MRI.  CXR reported opacity in the left lung base consistent with PNA.  Type of Study: Bedside Swallow Evaluation Previous Swallow Assessment: BSE on 10/03/13 following intubation, recomending NPO Diet Prior to this Study:  NPO Temperature Spikes Noted: No Respiratory Status: Room air History of Recent Intubation: No Behavior/Cognition: Lethargic/Drowsy Oral Cavity Assessment:  (Unable to evaluate) Oral Care Completed by SLP: No Oral Cavity - Dentition:  (Unable to evaluate ) Vision:  (Pt is blind ) Patient Positioning: Upright in bed Baseline Vocal Quality: Not observed    Oral/Motor/Sensory Function Overall Oral Motor/Sensory Function:  (Unable to evalute )   Ice Chips Ice chips: Not tested   Thin Liquid Thin Liquid: Impaired Presentation: Spoon Oral Phase Impairments: Reduced labial seal;Reduced lingual movement/coordination;Poor awareness of bolus Oral Phase Functional Implications: Right anterior spillage;Left anterior spillage;Oral holding Pharyngeal  Phase Impairments: Unable to trigger swallow    Nectar Thick Nectar Thick Liquid: Not tested   Honey Thick Honey Thick Liquid: Not tested   Puree Puree: Not tested   Solid     Solid: Not tested     Colin Mulders M.S., CCC-SLP Acute Rehabilitation Services Office: [phone]  [place] 01/27/2020,2:49 PM

## 2020-01-28 ENCOUNTER — Inpatient Hospital Stay (HOSPITAL_COMMUNITY): Payer: Medicare (Managed Care)

## 2020-01-28 DIAGNOSIS — R55 Syncope and collapse: Secondary | ICD-10-CM

## 2020-01-28 DIAGNOSIS — I482 Chronic atrial fibrillation, unspecified: Secondary | ICD-10-CM

## 2020-01-28 DIAGNOSIS — Z8679 Personal history of other diseases of the circulatory system: Secondary | ICD-10-CM

## 2020-01-28 LAB — COMPREHENSIVE METABOLIC PANEL
ALT: 74 U/L — ABNORMAL HIGH (ref 0–44)
AST: 33 U/L (ref 15–41)
Albumin: 3 g/dL — ABNORMAL LOW (ref 3.5–5.0)
Alkaline Phosphatase: 116 U/L (ref 38–126)
Anion gap: 12 (ref 5–15)
BUN: 72 mg/dL — ABNORMAL HIGH (ref 8–23)
CO2: 31 mmol/L (ref 22–32)
Calcium: 9.1 mg/dL (ref 8.9–10.3)
Chloride: 101 mmol/L (ref 98–111)
Creatinine, Ser: 2.39 mg/dL — ABNORMAL HIGH (ref 0.61–1.24)
GFR calc Af Amer: 30 mL/min — ABNORMAL LOW (ref >60)
GFR calc non Af Amer: 26 mL/min — ABNORMAL LOW (ref >60)
Glucose, Bld: 217 mg/dL — ABNORMAL HIGH (ref 70–99)
Potassium: 3.1 mmol/L — ABNORMAL LOW (ref 3.5–5.1)
Sodium: 144 mmol/L (ref 135–145)
Total Bilirubin: 0.5 mg/dL (ref 0.3–1.2)
Total Protein: 8.4 g/dL — ABNORMAL HIGH (ref 6.5–8.1)

## 2020-01-28 LAB — CBC
HCT: 41.2 % (ref 39.0–52.0)
Hemoglobin: 12 g/dL — ABNORMAL LOW (ref 13.0–17.0)
MCH: 23.8 pg — ABNORMAL LOW (ref 26.0–34.0)
MCHC: 29.1 g/dL — ABNORMAL LOW (ref 30.0–36.0)
MCV: 81.7 fL (ref 80.0–100.0)
Platelets: 201 10*3/uL (ref 150–400)
RBC: 5.04 MIL/uL (ref 4.22–5.81)
RDW: 16.5 % — ABNORMAL HIGH (ref 11.5–15.5)
WBC: 14.2 10*3/uL — ABNORMAL HIGH (ref 4.0–10.5)
nRBC: 0 % (ref 0.0–0.2)

## 2020-01-28 LAB — GLUCOSE, CAPILLARY
Glucose-Capillary: 191 mg/dL — ABNORMAL HIGH (ref 70–99)
Glucose-Capillary: 204 mg/dL — ABNORMAL HIGH (ref 70–99)
Glucose-Capillary: 209 mg/dL — ABNORMAL HIGH (ref 70–99)
Glucose-Capillary: 218 mg/dL — ABNORMAL HIGH (ref 70–99)
Glucose-Capillary: 258 mg/dL — ABNORMAL HIGH (ref 70–99)
Glucose-Capillary: 279 mg/dL — ABNORMAL HIGH (ref 70–99)

## 2020-01-28 LAB — HEPATITIS B CORE ANTIBODY, IGM: Hep B C IgM: NONREACTIVE

## 2020-01-28 LAB — PHOSPHORUS
Phosphorus: 3.4 mg/dL (ref 2.5–4.6)
Phosphorus: 3 mg/dL (ref 2.5–4.6)

## 2020-01-28 LAB — MAGNESIUM
Magnesium: 2.1 mg/dL (ref 1.7–2.4)
Magnesium: 2.1 mg/dL (ref 1.7–2.4)

## 2020-01-28 LAB — AMMONIA: Ammonia: 36 umol/L — ABNORMAL HIGH (ref 9–35)

## 2020-01-28 LAB — HEPATITIS B SURFACE ANTIGEN: Hepatitis B Surface Ag: NONREACTIVE

## 2020-01-28 MED ORDER — TORSEMIDE 20 MG PO TABS: 100.0000 mg | ORAL_TABLET | Freq: Two times a day (BID) | ORAL | Status: DC

## 2020-01-28 MED ORDER — INSULIN GLARGINE 100 UNIT/ML ~~LOC~~ SOLN: 50.0000 [IU] | Freq: Every day | SUBCUTANEOUS | Status: DC

## 2020-01-28 MED ORDER — HYDRALAZINE HCL 50 MG PO TABS
100.0000 mg | ORAL_TABLET | Freq: Three times a day (TID) | ORAL | Status: DC
  Administered 2020-01-28: 100 mg
  Filled 2020-01-28: qty 2

## 2020-01-28 MED ORDER — OSMOLITE 1.5 CAL PO LIQD
1000.0000 mL | ORAL | Status: DC
  Administered 2020-01-28 – 2020-01-31 (×4): 1000 mL
  Filled 2020-01-28 (×6): qty 1000

## 2020-01-28 MED ORDER — SODIUM CHLORIDE 0.9 % IV SOLN
3.0000 g | Freq: Three times a day (TID) | INTRAVENOUS | Status: DC
  Administered 2020-01-28 – 2020-01-30 (×8): 3 g via INTRAVENOUS
  Filled 2020-01-28: qty 3
  Filled 2020-01-28: qty 8
  Filled 2020-01-28: qty 3
  Filled 2020-01-28: qty 8
  Filled 2020-01-28: qty 3
  Filled 2020-01-28 (×4): qty 8
  Filled 2020-01-28 (×3): qty 3

## 2020-01-28 MED ORDER — PRO-STAT SUGAR FREE PO LIQD
30.0000 mL | Freq: Three times a day (TID) | ORAL | Status: DC
  Administered 2020-01-28 – 2020-01-31 (×9): 30 mL
  Filled 2020-01-28 (×9): qty 30

## 2020-01-28 MED ORDER — METOPROLOL TARTRATE 25 MG PO TABS
25.0000 mg | ORAL_TABLET | Freq: Two times a day (BID) | ORAL | Status: DC
  Administered 2020-01-28: 25 mg via ORAL
  Filled 2020-01-28: qty 1

## 2020-01-28 MED ORDER — POTASSIUM CHLORIDE 20 MEQ PO PACK
40.0000 meq | PACK | ORAL | Status: AC
  Administered 2020-01-28 (×2): 40 meq via ORAL
  Filled 2020-01-28 (×2): qty 2

## 2020-01-28 MED ORDER — TORSEMIDE 100 MG PO TABS
100.0000 mg | ORAL_TABLET | Freq: Two times a day (BID) | ORAL | Status: DC
  Filled 2020-01-28: qty 1

## 2020-01-28 MED ORDER — AMLODIPINE BESYLATE 10 MG PO TABS
10.0000 mg | ORAL_TABLET | Freq: Every day | ORAL | Status: DC
  Administered 2020-01-28: 10 mg
  Filled 2020-01-28: qty 1

## 2020-01-28 MED ORDER — INSULIN GLARGINE 100 UNIT/ML ~~LOC~~ SOLN
40.0000 [IU] | Freq: Every day | SUBCUTANEOUS | Status: DC
  Administered 2020-01-28: 40 [IU] via SUBCUTANEOUS
  Filled 2020-01-28 (×2): qty 0.4

## 2020-01-28 MED ORDER — INSULIN ASPART 100 UNIT/ML ~~LOC~~ SOLN
0.0000 [IU] | SUBCUTANEOUS | Status: DC
  Administered 2020-01-28 – 2020-01-29 (×5): 11 [IU] via SUBCUTANEOUS
  Administered 2020-01-29: 20 [IU] via SUBCUTANEOUS
  Administered 2020-01-29 – 2020-01-30 (×2): 15 [IU] via SUBCUTANEOUS
  Administered 2020-01-30 (×2): 11 [IU] via SUBCUTANEOUS

## 2020-01-28 NOTE — Progress Notes (Signed)
STROKE TEAM PROGRESS NOTE   INTERVAL HISTORY EEG tech is at bedside doing EEG. He is still sleepy and only open eyes with strong pain stimulation. When eyes open, he was able to say "all right" but not answer questions or following commands. With pain, he was able to withdraw in all extremities. CT repeat no change. Labs and vitals are stable.    OBJECTIVE Vitals:   01/27/20 1846 01/27/20 2046 01/27/20 2350 01/28/20 0339  BP: (!) 158/58 (!) 144/58 (!) 159/73 (!) 152/71  Pulse: 79 74 73 69  Resp: (!) 25 (!) 24 (!) 23 20  Temp: 98 F (36.7 C) 97.9 F (36.6 C) 98 F (36.7 C) 98.2 F (36.8 C)  TempSrc: Axillary Axillary Axillary Oral  SpO2: 93% 92% 93% 94%  Weight:    (!) 139.4 kg  Height:        CBC:  Recent Labs  Lab 01/27/20 0317 01/28/20 0928  WBC 12.2* 14.2*  HGB 11.2* 12.0*  HCT 38.3* 41.2  MCV 80.3 81.7  PLT 192 161    Basic Metabolic Panel:  Recent Labs  Lab 01/27/20 0317 01/28/20 0928  NA 139 144  K 3.4* 3.1*  CL 95* 101  CO2 30 31  GLUCOSE 225* 217*  BUN 77* 72*  CREATININE 2.66* 2.39*  CALCIUM 9.0 9.1    Lipid Panel:     Component Value Date/Time   CHOL 103 01/27/2020 0317   CHOL 129 01/05/2017 1235   TRIG 68 01/27/2020 0317   HDL 31 (L) 01/27/2020 0317   HDL 36 (L) 01/05/2017 1235   CHOLHDL 3.3 01/27/2020 0317   VLDL 14 01/27/2020 0317   LDLCALC 58 01/27/2020 0317   LDLCALC 71 01/05/2017 1235   HgbA1c:  Lab Results  Component Value Date   HGBA1C 8.9 (H) 01/26/2020   Urine Drug Screen:     Component Value Date/Time   LABOPIA NONE DETECTED 10/01/2013 1915   COCAINSCRNUR NONE DETECTED 10/01/2013 1915   COCAINSCRNUR NEG 12/16/2008 2246   LABBENZ NONE DETECTED 10/01/2013 1915   LABBENZ NEG 12/16/2008 2246   AMPHETMU NONE DETECTED 10/01/2013 1915   THCU NONE DETECTED 10/01/2013 1915   LABBARB NONE DETECTED 10/01/2013 1915    Alcohol Level     Component Value Date/Time   ETH <11 10/01/2013 1504    IMAGING  CT HEAD WO  CONTRAST 01/26/2020 IMPRESSION:  Unchanged intraventricular blood layering within the occipital horns of the lateral ventricles.   CT Head Wo Contrast 01/25/2020 IMPRESSION:  There is hemorrhage in the dependent portion of each lateral ventricle of uncertain etiology. No brain parenchymal hemorrhage elsewhere noted. No mass. No extra-axial fluid. There is mild atrophy with extensive periventricular small vessel disease. No acute appearing infarct is evident. There are multiple foci of arterial vascular calcification. There is mucosal thickening in several ethmoid air cells.   DG Chest Portable 1 View 01/25/2020 IMPRESSION:  No active disease. Chronic cardiomegaly and pulmonary vascular congestion.   US Abdomen Limited RUQ 01/25/2020 IMPRESSION:  Gallbladder sludge and gallstones without complicating factors. Right renal cysts stable from prior CT examination.    ECG - paced - rate 69 BPM.    PHYSICAL EXAM  Temp:  [97.9 F (36.6 C)-98.4 F (36.9 C)] 98.2 F (36.8 C) (06/14 0339) Pulse Rate:  [69-80] 69 (06/14 0339) Resp:  [19-28] 20 (06/14 0339) BP: (122-159)/(53-73) 152/71 (06/14 0339) SpO2:  [91 %-94 %] 94 % (06/14 0339) Weight:  [139.4 kg] 139.4 kg (06/14 0339)  General - Well  nourished, well developed, drowsy sleepy and hard to arouse.  Ophthalmologic - fundi not visualized due to noncooperation.  Cardiovascular - Regular rhythm and rate.  Neuro - drowsy sleepy and hard to arouse, with strong painful stimulation, he was able to open eyes briefly, only moaning on pain or say "all right", but not following commands. Bilateral eyes s/p surgery, no identified pupils bilaterally. Eye mid position, slow doll's eyes. Positive gag cough and corneal. No clear facial droop. Tongue protrusion not cooperative. Not blinking to visual threat with eyes opening. With pain stimulation, withdraw to BLEs and purposeful movement BUEs. DTR 1+ and no babinski. Sensation, coordination and gait not  tested.    ASSESSMENT/PLAN [name] is a 74 y.o. male with history of atrial fibrillation not on anticoagulation due to history of rectal bleeding (on ASA), cardiomyopathy, PPM, coronary disease, hypertension, legally blind, diabetes type 2, hypertension, hyperlipidemia and morbid obesity  presenting with nausea, vomiting, headache, recent fall, recent syncope, and AMS. He did not receive IV t-PA due to [place].  [name] - bilateral lateral ventricle small [name], etiology unclear, could be traumatic due to fall vs. spontaneous  CT Head - 01/25/20 - There is hemorrhage in the dependent portion of each lateral ventricle of uncertain etiology.   CT head - 01/26/20 - Unchanged intraventricular blood layering within the occipital horns of the lateral ventricles.   CT head 01/27/20 - slight increase of [name], borderline increase in ventriculomegaly  CT head repeat 01/28/20 - unchanged  MRI head - not able to do due to incompatible PPM   Carotid Doppler - unremarkable  2D Echo - EF 60-65%  [employer] Virus 2 - negative  LDL - 58  HgbA1c - 8.9  VTE prophylaxis - SCDs  aspirin 81 mg daily prior to admission, now on No antithrombotic due to [name]  Ongoing aggressive stroke risk factor management  Therapy recommendations:  pending  Disposition:  Pending  Metabolic encephalopathy  Pt sleepy drowsy out of proportion for brain pathology - however, it could be the case if pt had baseline cognitive impairment   Etiology unclear  Not on sedation  Elevated LFT - AST/ALT 52/119 ->33/74  Elevated ammonia - 42->36  CKD IV with elevated BUN 75->77->72 and Cre 2.37->2.66->2.39  EEG pending to rule out seizure  Management per primary team  Hypertension  Home BP meds: amlodipine ; apresoline ; metoprolol ; Demadex ; Zaroxolyn   Current BP meds: amlodipine ; hydralazine ; Demadex; metoprolol -> off due to no po access -> plan for cortrak and the restart po meds  Stable at  140-150s . SBP goal < 140 mm Hg . Long-term BP goal normotensive  Hyperlipidemia  Home Lipid lowering medication: Crestor 20 mg daily  LDL 58, goal < 70  Hold statin for now due to elevated LFT and [name]  Consider to resume statin at discharge  Diabetes  Home diabetic meds: insulin  Current diabetic meds: lantus  HgbA1c 8.9, goal < 7.0  Hyperglycemia  SSI  CBG monitoring  Close PCP follow-up for better DM control  PAF  Not on AC due to [place] in 2015 (see below)  Not AC candidate now due to [name]  On ASA PTA  Hx of ICH  10/2013 - admitted for AMS found to have left temporoparietal ICH and edema. Concerning for hemorrhagic conversion but not able to have MRI done. Also had SOB due to PE from mobile mass (most likely clot vs unlikely vegetation) found on pacer wire in  RA extending into RV. Not an anticoagulation candidate for treatment due to [place]. Placed on empiric abs for possible endocarditis/RA vegetation on wire - completed course ceftriaxone on 2/26. On aspirin 81 mg orally every day prior to admission.  Now on aspirin 81 mg orally every day.   Dysphagia  Not able to pass swallow due to altered mental status  Currently n.p.o.  Requested cortrak and TF  Consider to resume po meds after cortrak  Speech on board  Other Stroke Risk Factors  Advanced age  Former cigarette smoker - quit  ETOH use, advised to drink no more than 1 alcoholic beverage per day.  Obesity, Body mass index is 39.46 kg/m., recommend weight loss, diet and exercise as appropriate   Coronary artery disease  Obstructive sleep apnea, on CPAP at home  Congestive Heart Failure s/p pacer  Other Active Problems  Code status - Full code  Legally blind - b/l eye surgery  PPM for bradycardia  Hospital day # 3   Rosalin Hawking, MD PhD Stroke Neurology 01/28/2020 12:02 PM    To contact Stroke Continuity provider, please refer to [email or web address] After hours, contact General Neurology

## 2020-01-28 NOTE — Evaluation (Signed)
Physical Therapy Evaluation Patient Details Name: [name] MRN: [id number] DOB: 1946-04-20 Today's Date: 01/28/2020   History of Present Illness  Pt is a 74 y.o. male with PMH of HTN, PAF, SSS, CHF, tachycardia, Heart failure, cerebral artery occlusion with cerebral infarction, OSA, chronic obstructive lung disease, pulmonary edema, DM, type II, CVA, DJD, stage 3 chronic kidney disease, AKI, Gout, pacemaker, depression, blindness, coarse tremors, CAD, L mid foot amputation, amd pacemaker. Pt sent to ED from outpatient imaging when pt had syncope episode, urinated on self, and became confused. CT reveals stable hemorrhage layering in each occipital horn of the lateral ventricles, stable.  Clinical Impression  Pt admitted with above diagnosis. At the time of PT eval pt was lethargic, and required sternal rub to elicit any response. Pt with eyes closed throughout session. Noted spontaneous RUE movement and spontaneous head movement to adjust his position on the pillow, however no other active movement observed. Based on performance today, recommend further acute therapy at the SNF level. Pt currently with functional limitations due to the deficits listed below (see PT Problem List). Pt will benefit from skilled PT to increase their independence and safety with mobility to allow discharge to the venue listed below.       Follow Up Recommendations SNF;Supervision/Assistance - 24 hour    Equipment Recommendations   (TBD by next venue of care)    Recommendations for Other Services       Precautions / Restrictions Precautions Precautions: Fall Restrictions Weight Bearing Restrictions: No      Mobility  Bed Mobility Overal bed mobility: Needs Assistance             General bed mobility comments: Unable. Total assist for repositioning in the bed.   Transfers                 General transfer comment: Unable  Ambulation/Gait             General Gait Details:  Unable  Stairs            Wheelchair Mobility    Modified Rankin (Stroke Patients Only) Modified Rankin (Stroke Patients Only) Pre-Morbid Rankin Score: Moderately severe disability Modified Rankin: Severe disability     Balance Overall balance assessment:  (Unable to assess)                                           Pertinent Vitals/Pain Pain Assessment: Faces Faces Pain Scale: No hurt    Home Living Family/patient expects to be discharged to:: Private residence Living Arrangements: Alone Available Help at Discharge: Family;Personal care attendant;Available PRN/intermittently Type of Home: Apartment Home Access: Ramped entrance     Home Layout: One level Home Equipment: Walker - 4 wheels;Bedside commode;Shower seat;Grab bars - tub/shower;Cane - single point Additional Comments: PCA daily for several hours, PACE 2x/week. Above information taken from prior admission in 2020 as pt unable to provide any history.      Prior Function Level of Independence: Needs assistance   Gait / Transfers Assistance Needed: ambulates at home with cane or RW  ADL's / Homemaking Assistance Needed: PCA (2 hrs am and 3 hrs pm) to assist with ADLS, IADLS, daughter does shopping, receives meals on wheels  Comments: patient blind; Information taken from prior admission in 2020 as pt cannot provide history.      Hand Dominance   Dominant Hand: Right  Extremity/Trunk Assessment   Upper Extremity Assessment Upper Extremity Assessment: RUE deficits/detail;LUE deficits/detail RUE Deficits / Details: spontaneous movement in R arm. Arm flaccid with tremors present with PROM in elbow flexion, forearm supination, and wrist extension  LUE Deficits / Details: LUE flaccid with tremors present with PROM in elbow flexion, forearm supination, and wrist extension    Lower Extremity Assessment Lower Extremity Assessment: LLE deficits/detail LLE Deficits / Details: Noted prior L  forefoot amputation. Pt With no active movement of LE's, even with noxious stimuli applied.     Cervical / Trunk Assessment Cervical / Trunk Assessment:  (Unable to assess)  Communication      Cognition Arousal/Alertness: Lethargic   Overall Cognitive Status: Difficult to assess                                 General Comments: Pt lethargic and needed noxious stimuli (sternal rub) to elicit response of vocalization with eyes closed. Spontaneous movement of head when repositioned      General Comments      Exercises     Assessment/Plan    PT Assessment Patient needs continued PT services  PT Problem List Decreased strength;Decreased balance;Decreased mobility;Decreased coordination;Decreased cognition;Decreased safety awareness;Decreased knowledge of use of DME;Decreased knowledge of precautions;Obesity       PT Treatment Interventions DME instruction;Gait training;Functional mobility training;Therapeutic activities;Therapeutic exercise;Balance training;Neuromuscular re-education;Cognitive remediation;Patient/family education    PT Goals (Current goals can be found in the Care Plan section)  Acute Rehab PT Goals PT Goal Formulation: Patient unable to participate in goal setting Time For Goal Achievement: 02/11/20 Potential to Achieve Goals: Fair    Frequency Min 3X/week   Barriers to discharge        Co-evaluation PT/OT/SLP Co-Evaluation/Treatment: Yes Reason for Co-Treatment: Complexity of the patient's impairments (multi-system involvement);For patient/therapist safety PT goals addressed during session: Strengthening/ROM (Initial assessment) OT goals addressed during session: Other (comment) (positioning)       AM-PAC PT "6 Clicks" Mobility  Outcome Measure Help needed turning from your back to your side while in a flat bed without using bedrails?: Total Help needed moving from lying on your back to sitting on the side of a flat bed without using  bedrails?: Total Help needed moving to and from a bed to a chair (including a wheelchair)?: Total Help needed standing up from a chair using your arms (e.g., wheelchair or bedside chair)?: Total Help needed to walk in hospital room?: Total Help needed climbing 3-5 steps with a railing? : Total 6 Click Score: 6    End of Session Equipment Utilized During Treatment: Oxygen Activity Tolerance: Patient limited by lethargy Patient left: in bed;with call bell/phone within reach;with bed alarm set Nurse Communication: Mobility status;Need for lift equipment;Other (comment) (Requesting air mattress, Prevalon boots) PT Visit Diagnosis: Other symptoms and signs involving the nervous system (M57.846)    Time: 9629-5284 PT Time Calculation (min) (ACUTE ONLY): 23 min   Charges:   PT Evaluation $PT Eval High Complexity: 1 High          Rolinda Roan, PT, DPT Acute Rehabilitation Services Pager: [phone] Office: [phone]   Thelma Comp 01/28/2020, 10:51 AM

## 2020-01-28 NOTE — Progress Notes (Signed)
Inpatient Diabetes Program Recommendations  AACE/ADA: New Consensus Statement on Inpatient Glycemic Control (2015)  Target Ranges:  Prepandial:   less than 140 mg/dL      Peak postprandial:   less than 180 mg/dL (1-2 hours)      Critically ill patients:  140 - 180 mg/dL   Lab Results  Component Value Date   GLUCAP 191 (H) 01/28/2020   HGBA1C 8.9 (H) 01/26/2020    Review of Glycemic Control Results for [name], [name] (MRN [id number]) as of 01/28/2020 12:01  Ref. Range 01/27/2020 12:19 01/27/2020 16:59 01/27/2020 21:13 01/28/2020 06:07 01/28/2020 08:14 01/28/2020 11:28  Glucose-Capillary Latest Ref Range: 70 - 99 mg/dL 203 (H) 184 (H) 199 (H) 218 (H) 204 (H) 191 (H)   Diabetes history:  DM2  Outpatient Diabetes medications:  Semglee 100 units daily Victoza 1.8 mg   Current orders for Inpatient glycemic control:  Lantus 20 units daily Novolog 0-15 tid   Inpatient Diabetes Program Recommendations:    While patient is NPO, please consider,  Novolog 0-15 units Q4H May need to increase basal as well.  Will continue to follow while inpatient.  Thank you, Reche Dixon, RN, BSN Diabetes Coordinator Inpatient Diabetes Program [phone] (team pager from 8a-5p)

## 2020-01-28 NOTE — Progress Notes (Signed)
Pharmacy Antibiotic Note  [name] is a 74 y.o. male admitted on 01/25/2020 with aspiration pneumonia.  Pharmacy has been consulted for Unasyn dosing.  Plan: Unasyn 3gm IV q8h Monitor for clinical course, de-escalation and renal function  Height: 6\' 2"  (188 cm) Weight: (!) 139.4 kg (307 lb 5.1 oz) IBW/kg (Calculated) : 82.2  Temp (24hrs), Avg:98 F (36.7 C), Min:97.9 F (36.6 C), Max:98.2 F (36.8 C)  Recent Labs  Lab 01/25/20 1356 01/26/20 0029 01/27/20 0317 01/28/20 0928  WBC 13.2* 11.4* 12.2* 14.2*  CREATININE 2.51* 2.37* 2.66* 2.39*    Estimated Creatinine Clearance: 42.1 mL/min (A) (by C-G formula based on SCr of 2.39 mg/dL (H)).    No Known Allergies   [name], PharmD, BCPS, FNKF Clinical Pharmacist [name] Please utilize Amion for appropriate phone number to reach the unit pharmacist ([place])   01/28/2020 2:25 PM

## 2020-01-28 NOTE — Progress Notes (Addendum)
HD#3 Subjective:  Overnight Events: no overnight events.  [name] seen at bedside on rounds today. He is somnolent and hard to arouse.   Objective:  Vital signs in last 24 hours: Vitals:   01/27/20 1846 01/27/20 2046 01/27/20 2350 01/28/20 0339  BP: (!) 158/58 (!) 144/58 (!) 159/73 (!) 152/71  Pulse: 79 74 73 69  Resp: (!) 25 (!) 24 (!) 23 20  Temp: 98 F (36.7 C) 97.9 F (36.6 C) 98 F (36.7 C) 98.2 F (36.8 C)  TempSrc: Axillary Axillary Axillary Oral  SpO2: 93% 92% 93% 94%  Weight:    (!) 139.4 kg  Height:       Supplemental O2: Nasal Cannula SpO2: 94 % O2 Flow Rate (L/min): 3 L/min   Physical Exam:  Physical Exam Constitutional:      Appearance: Normal appearance.  HENT:     Head: Normocephalic and atraumatic.  Eyes:     Extraocular Movements: Extraocular movements intact.  Cardiovascular:     Rate and Rhythm: Normal rate.     Pulses: Normal pulses.     Heart sounds: Normal heart sounds.  Pulmonary:     Effort: Tachypnea present.     Breath sounds: Normal breath sounds.  Abdominal:     General: Bowel sounds are normal.     Palpations: Abdomen is soft.     Tenderness: There is no abdominal tenderness.  Musculoskeletal:        General: Normal range of motion.     Right lower leg: No edema.     Left lower leg: No edema.  Skin:    General: Skin is warm and dry.  Neurological:     General: No focal deficit present.     Comments: leathargic     Filed Weights   01/26/20 0012 01/27/20 0428 01/28/20 0339  Weight: (!) 143.8 kg (!) 143 kg (!) 139.4 kg     Intake/Output Summary (Last 24 hours) at 01/28/2020 1220 Last data filed at 01/28/2020 0341 Gross per 24 hour  Intake --  Output 1150 ml  Net -1150 ml   Net IO Since Admission: -2,948.27 mL [01/28/20 1220]  Pertinent Labs: CBC Latest Ref Rng & Units 01/28/2020 01/27/2020 01/26/2020  WBC 4.0 - 10.5 K/uL 14.2(H) 12.2(H) 11.4(H)  Hemoglobin 13.0 - 17.0 g/dL 12.0(L) 11.2(L) 12.1(L)   Hematocrit 39 - 52 % 41.2 38.3(L) 40.2  Platelets 150 - 400 K/uL 201 192 198    CMP Latest Ref Rng & Units 01/28/2020 01/27/2020 01/26/2020  Glucose 70 - 99 mg/dL 217(H) 225(H) 221(H)  BUN 8 - 23 mg/dL 72(H) 77(H) 75(H)  Creatinine 0.61 - 1.24 mg/dL 2.39(H) 2.66(H) 2.37(H)  Sodium 135 - 145 mmol/L 144 139 138  Potassium 3.5 - 5.1 mmol/L 3.1(L) 3.4(L) 3.5  Chloride 98 - 111 mmol/L 101 95(L) 95(L)  CO2 22 - 32 mmol/L 31 30 28   Calcium 8.9 - 10.3 mg/dL 9.1 9.0 9.3  Total Protein 6.5 - 8.1 g/dL 8.4(H) 8.0 8.0  Total Bilirubin 0.3 - 1.2 mg/dL 0.5 0.6 0.6  Alkaline Phos 38 - 126 U/L 116 121 131(H)  AST 15 - 41 U/L 33 31 52(H)  ALT 0 - 44 U/L 74(H) 83(H) 119(H)    Pending Labs: none  Imaging:  CT scan: Stable hemorrhage layering in each occipital horn of the lateral ventricles, stable. No hemorrhage appreciable. There is atrophy with extensive supratentorial small vessel disease, stable. No acute infarct evident.  There is extensive arterial vascular calcification at multiple  sites. There is mucosal thickening in multiple ethmoid air cells  Assessment/Plan:   Principal Problem:   ICH (intracerebral hemorrhage) (HCC)   Patient Summary: Patient is a 74 year old male with past medical history significant for prior intracranial hemorrhage, HFpEF, CKD stage IV, hypertension, diabetes mellitus who presented for evaluation of headache and was found to havebilateralintraventricular hemorrhage.   # Bilateral intraventricular hemorrhage Patient has minimal improvement from yesterday. Still lethargic and minimally responds to physical and verbal stimuli - Repeat CT scan shows not progression - Patient still altered and will likely need NG tube placed for medication management include blood pressure management. - EEG pending - Continue NPO, needs ng tube and tube feeds started.  - Appreciate neurologies assistance.  #Aspiraiton pneumonia: #SHOB: #Leukocytosis: - Patient presents  with a new oxygen requirement from home with AMS and CXR concerning for aspiration. He was started on Ceftriaxone and Azithromycin but continues to have an increase in his leukocytosis from 12 to 14 overnight. He is afebrile and tachypnic.  - Continue ceftriaxone and azithro, on day 2. May need to consider broadening AB to Unasyn  - Continue supplemental O2 as needed - Aspiration precautions  - If patient desaturates, get ABG and place on BIPAP.  # Hypertension - Continue prn labetalol to keep blood pressure below 140 SBP. - switch to oral medications once NG tube placed  # HFpEF - Patient has had good diuresis, we will hold his home diuretics at this time.   # T2DM - Patients lantus was decreased to 20 due NPO status.  - Will monitor closely and increase once his NG tube is placed   #AKI on CKD: - Mild AKI in the setting of decreased PO intake. - Will stop diuretics and give gentle hydration.  - Baseline Cr of around 2.3, and elevated at 2.66 yesterday. Patient is back at baseline today at 2.39' - Patient is likely at dry weight and will hold off on additional diuresis.   #Hepatocellular injury: - Patient does have mild elevations in transaminase enzymes with ALT is 74,  With a mild elevation in ammonia.  - Continue to monitor daily cmp.   Diet: NPO IVF: None,None VTE: SCDs Code: Full PT/OT recs: Pending TOC recs: pending  Dispo: Anticipated discharge pending further evaluation and management .    Marianna Payment, D.O. [facility]IM[facility], PGY-1 Date 01/28/2020 Time 12:20 PM Pager: [phone] Please contact the on call pager after 5 pm and on weekends at [phone].

## 2020-01-28 NOTE — Progress Notes (Signed)
EEG Completed; Results Pending  

## 2020-01-28 NOTE — Progress Notes (Addendum)
Initial Nutrition Assessment  DOCUMENTATION CODES:   Obesity unspecified  INTERVENTION:  Once Cortrak is placed, begin:  Osmolite 1.5 cal @ 50ml/hr via Cortrak , advance 18ml/hr Q4H until goal rate of 21ml/hr (1622ml) is reached.   36ml Pro-stat TID via Cortrak  At goal, recommended tube feeding will provide 2820 kcal, 150 grams of protein, 1232ml free water  NUTRITION DIAGNOSIS:   Inadequate oral intake related to inability to eat as evidenced by NPO status.    GOAL:   Patient will meet greater than or equal to 90% of their needs    MONITOR:   Weight trends, Labs, I & O's, TF tolerance  REASON FOR ASSESSMENT:   Consult Enteral/tube feeding initiation and management  ASSESSMENT:   Pt with a PMH significant for Afib, cardiomyopathy, PPM, coronary disease, HTN, legally blind, T2DM, HTN, and HLD presented with N/V, headache, recent fall, recent syncope and AMS. Pt admitted with [place].   6/13 CXE showing signs concerning for aspiration 6/14 EEG  Per MD, pt's AMS is out of proportion for his INH. SLP and MD agree pt should remain NPO at this time. Order for Cortrak has been placed. Discussed with MD, okay to order tube feeding. Also discussed pt with RN.  Pt having EEG at time of RD visit and was not responive to RD voice or touch.   Per wt readings, pt with a possible 7.9% wt loss x3 months, which is significant for time frame.   Labs: K+ 3.1 (L) CBGs [phone] (Diabetes Coordinator following)  Medications: Novolog, Lantus  UOP: 1,157ml x24 hours I/O: -2,948.8ml since admit  NUTRITION - FOCUSED PHYSICAL EXAM:    Most Recent Value  Orbital Region No depletion  Upper Arm Region No depletion  Thoracic and Lumbar Region No depletion  Buccal Region No depletion  Temple Region No depletion  Clavicle Bone Region No depletion  Clavicle and Acromion Bone Region No depletion  Scapular Bone Region No depletion  Dorsal Hand No depletion  Patellar Region No  depletion  Anterior Thigh Region No depletion  Posterior Calf Region No depletion  Edema (RD Assessment) Mild  Hair Reviewed  Eyes Reviewed  Mouth Reviewed  Skin Reviewed  Nails Reviewed       Diet Order:   Diet Order            Diet NPO time specified Except for: Sips with Meds  Diet effective now                 EDUCATION NEEDS:   Not appropriate for education at this time  Skin:  Skin Assessment: Skin Integrity Issues: Skin Integrity Issues:: Other (Comment) Other: amputation all L toes  Last BM:  6/10  Height:   Ht Readings from Last 1 Encounters:  01/26/20 6\' 2"  (1.88 m)    Weight:   Wt Readings from Last 10 Encounters:  01/28/20 (!) 139.4 kg  01/16/20 (!) 145.2 kg  11/05/19 (!) 150.1 kg  10/15/19 (!) 151.7 kg  08/20/19 (!) 143.4 kg  06/07/19 (!) 141.5 kg  05/23/19 (!) 142.4 kg  05/11/19 (!) 144.4 kg  04/10/19 (!) 141.1 kg  03/09/19 (!) 143.8 kg    Ideal Body Weight:  86.36 kg  BMI:  Body mass index is 39.46 kg/m.  Estimated Nutritional Needs:   Kcal:  0960-4540  Protein:  150-170 grams  Fluid:  >2L/d  Larkin Ina, MS, RD, LDN RD pager number and weekend/on-call pager number located in [employer].

## 2020-01-28 NOTE — Procedures (Signed)
Cortrak  Person Inserting Tube:  Lakitha Gordy M, RD Tube Type:  Cortrak - 43 inches Tube Location:  Right nare Initial Placement:  Stomach Secured by: Bridle Technique Used to Measure Tube Placement:  Documented cm marking at nare/ corner of mouth Cortrak Secured At:  74 cm Procedure Comments:  Cortrak Tube Team Note:  Consult received to place a Cortrak feeding tube.   No x-ray is required. RN may begin using tube.    If the tube becomes dislodged please keep the tube and contact the Cortrak team at [email or web address] ([email or web address]) for replacement.  If after hours and replacement cannot be delayed, place a NG tube and confirm placement with an abdominal x-ray.      Jarel Cuadra, MS, RD, LDN, CNSC Inpatient Clinical Dietitian RD pager # available in [facility]  After hours/weekend pager # available in [facility]ION      

## 2020-01-28 NOTE — Evaluation (Addendum)
Occupational Therapy Evaluation Patient Details Name: [name] MRN: [id number] DOB: 1946/01/12 Today's Date: 01/28/2020    History of Present Illness Pt is a 74 y.o. male with PMH of HTN, PAF, SSS, CHF, tachycardia, Heart failure, cerebral artery occlusion with cerebral infarction, OSA, chronic obstructive lung disease, pulmonary edema, DM, type II, CVA, DJD, stage 3 chronic kidney disease, AKI, Gout, depression, blindness, coarse tremors, CAD, L mid foot amputation, amd pacemaker. Pt sent to ED from outpatient imaging when pt had syncope episode, urinated on self, and became confused. CT reveals stable hemorrhage layering in each occipital horn of the lateral ventricles, stable.    Clinical Impression   PTA pt living alone with PRN PCA to assist with ADLs and ambulated with cane or walker - taken from chart due to pt's inability to communicate. Pt admitted for above and limited by problem list below. Pt lethargic needing noxious stimuli with min alertness - with vocalization, spontaneously moving head, but keeping eyes closed. Spontaneous movement seen in R UE, but no AROM or purposeful movement noted in B UE. Tremors present with PROM in elbow flexion, forearm supination, and wrist extension of B UE. Pt total A +2 for ADLs and bed mobility. Pt was repositioned in bed with B UE and LEs supported. Vitals were monitored during session (vitals prior to session: BP 166/80 (106) HR 81. Vitals once repositioned BP 183/67 (101) HR 76. RR through session ranged from 30-58. Noted RR decrease to 25 once repositioned in bed) discussed vitals with RN and need for air mattress and prevlon boots. Believe pt would benefit from skilled OT services acutely and at the SNF level with 24/7 supervision to return client to PLOF and increase independence in ADLs.     Follow Up Recommendations  SNF;Supervision/Assistance - 24 hour    Equipment Recommendations  Other (comment) (TBD at next venue of care)     Recommendations for Other Services       Precautions / Restrictions Precautions Precautions: Fall Restrictions Weight Bearing Restrictions: No      Mobility Bed Mobility Overal bed mobility: Needs Assistance             General bed mobility comments: Total A for repositioning in bed. Moved pt up in bed, shifted hips midline, and propped up B UE and LE with supports.   Transfers                 General transfer comment: deferred due to pt and therapist safety    Balance Overall balance assessment:  (deferred)                                         ADL either performed or assessed with clinical judgement   ADL Overall ADL's : Needs assistance/impaired                                       General ADL Comments: Pt total A +2 for all ADLs     Vision Baseline Vision/History: Legally blind Patient Visual Report:  (maintains eyes closed throughout session)       Perception     Praxis      Pertinent Vitals/Pain Pain Assessment: Faces Faces Pain Scale: No hurt     Hand Dominance Right   Extremity/Trunk Assessment Upper Extremity Assessment Upper  Extremity Assessment: RUE deficits/detail;LUE deficits/detail RUE Deficits / Details: No AROM or purposeful movement noted. Spontaneous movement in R arm. Tremors present with PROM in elbow flexion, forearm supination, and wrist extension  LUE Deficits / Details: No AROM or purposeful movement noted. Tremors present with PROM in elbow flexion, forearm supination, and wrist extension. Noted pull in UE when noxious stimuli applied at nail bed   Lower Extremity Assessment Lower Extremity Assessment: LLE deficits/detail LLE Deficits / Details: Noted prior L forefoot amputation. Pt With no active movement of LE's, even with noxious stimuli applied.    Cervical / Trunk Assessment Cervical / Trunk Assessment:  (Unable to assess)   Communication Communication Communication:  Receptive difficulties;Expressive difficulties   Cognition Arousal/Alertness: Lethargic   Overall Cognitive Status: Difficult to assess                                 General Comments: Pt lethargic and needed noxious stimuli (sternal rub) to elicit response of vocalization with eyes closed. Spontaneous movement of head when repositioned   General Comments  Vitals prior to session: BP 166/80 (106) HR 81. Vitals once repositioned BP 183/67 (101) HR 76. RR through session ranged from 30-58. Noted RR decrease to 25 once repositioned in bed. Discussed vitals with RN and need for air mattress and prevlon boots.    Exercises     Shoulder Instructions      Home Living Family/patient expects to be discharged to:: Private residence Living Arrangements: Alone Available Help at Discharge: Family;Personal care attendant;Available PRN/intermittently Type of Home: Apartment Home Access: Ramped entrance     Home Layout: One level     Bathroom Shower/Tub: Teacher, early years/pre: Standard     Home Equipment: Environmental consultant - 4 wheels;Bedside commode;Shower seat;Grab bars - tub/shower;Cane - single point   Additional Comments: PCA daily for several hours, PACE 2x/week. Above information taken from prior admission in 2020 as pt unable to provide any history.        Prior Functioning/Environment Level of Independence: Needs assistance  Gait / Transfers Assistance Needed: ambulates at home with cane or RW ADL's / Homemaking Assistance Needed: PCA (2 hrs am and 3 hrs pm) to assist with ADLS, IADLS, daughter does shopping, receives meals on wheels   Comments: patient blind; Information taken from prior admission in 2020 as pt cannot provide history.         OT Problem List: Decreased strength;Decreased range of motion;Decreased activity tolerance;Impaired balance (sitting and/or standing);Impaired vision/perception;Decreased coordination;Decreased cognition;Decreased safety  awareness;Decreased knowledge of use of DME or AE;Decreased knowledge of precautions;Cardiopulmonary status limiting activity;Impaired sensation;Impaired tone;Obesity;Impaired UE functional use      OT Treatment/Interventions: Self-care/ADL training;Therapeutic exercise;Neuromuscular education;Energy conservation;DME and/or AE instruction;Manual therapy;Splinting;Therapeutic activities;Cognitive remediation/compensation;Visual/perceptual remediation/compensation;Patient/family education;Balance training    OT Goals(Current goals can be found in the care plan section) Acute Rehab OT Goals Patient Stated Goal: unable OT Goal Formulation: Patient unable to participate in goal setting Time For Goal Achievement: 02/11/20 Potential to Achieve Goals: Fair ADL Goals Pt Will Perform Grooming: with max assist;bed level Pt Will Perform Upper Body Dressing: with max assist;bed level Pt Will Transfer to Toilet: with max assist;squat pivot transfer;bedside commode Additional ADL Goal #1: Patient will complete 1 step command 100% of the time with min verbal cues. Additional ADL Goal #2: Patient will complete meb mobility with Max A +2 as precurser for ADLs.  OT Frequency: Min 2X/week   Barriers  to D/C:            Co-evaluation PT/OT/SLP Co-Evaluation/Treatment: Yes Reason for Co-Treatment: Complexity of the patient's impairments (multi-system involvement);For patient/therapist safety PT goals addressed during session: Strengthening/ROM (Initial assessment) OT goals addressed during session: Other (comment);ADL's and self-care;Strengthening/ROM (positioning)      AM-PAC OT "6 Clicks" Daily Activity     Outcome Measure Help from another person eating meals?: Total Help from another person taking care of personal grooming?: Total Help from another person toileting, which includes using toliet, bedpan, or urinal?: Total Help from another person bathing (including washing, rinsing, drying)?:  Total Help from another person to put on and taking off regular upper body clothing?: Total Help from another person to put on and taking off regular lower body clothing?: Total 6 Click Score: 6   End of Session Equipment Utilized During Treatment: Oxygen Nurse Communication:  (vitals and need for air mattress and prevalon boots)  Activity Tolerance: Patient limited by lethargy;Treatment limited secondary to medical complications (Comment) (Increased BP) Patient left: in bed;with call bell/phone within reach;with bed alarm set  OT Visit Diagnosis: Other symptoms and signs involving cognitive function;Other symptoms and signs involving the nervous system (R29.898);Low vision, both eyes (H54.2);Apraxia (R48.2);Muscle weakness (generalized) (M62.81)                Time: 8721-5872 OT Time Calculation (min): 20 min Charges:  OT General Charges $OT Visit: 1 Visit OT Evaluation $OT Eval High Complexity: 1 High  Delmar Arriaga/OTS  Briceida Rasberry 01/28/2020, 11:27 AM

## 2020-01-28 NOTE — Procedures (Signed)
Patient Name: [name]  MRN: [id number]  Epilepsy Attending: Lora Havens  Referring Physician/Provider: Dr. Rosalin Hawking Date: 01/28/2020 Duration: 23.17 minutes  Patient history: 74 year old male who presented with altered mental status and was found to have bilateral lateral ventricle [name].  EEG evaluate for seizures.  Level of alertness: Lethargic  AEDs during EEG study: None  Technical aspects: This EEG study was done with scalp electrodes positioned according to the 10-20 International system of electrode placement. Electrical activity was acquired at a sampling rate of 500Hz  and reviewed with a high frequency filter of 70Hz  and a low frequency filter of 1Hz . EEG data were recorded continuously and digitally stored.   Description: No clear posterior dominant rhythm was seen.  EEG showed continuous generalized 5 to 9 Hz theta-alpha activity.  Hyperventilation and photic stimulation were not performed.     ABNORMALITY -Continuous slow, generalized  IMPRESSION: This study is suggestive of moderate diffuse encephalopathy, nonspecific etiology.  No seizures or definite epileptiform discharges were seen throughout the recording.    Anola Mcgough Barbra Sarks

## 2020-01-28 NOTE — Progress Notes (Signed)
SLP Cancellation Note  Patient Details Name: [name] MRN: [id number] DOB: 10-24-1945   Cancelled treatment:       Reason Eval/Treat Not Completed: Fatigue/lethargy limiting ability to participate. Will continue efforts.  Manfred Laspina B. Quentin Ore, [facility], [place] Speech Language Pathologist Office: [phone]  Shonna Chock 01/28/2020, 1:23 PM

## 2020-01-29 ENCOUNTER — Inpatient Hospital Stay (HOSPITAL_COMMUNITY): Payer: Medicare (Managed Care)

## 2020-01-29 DIAGNOSIS — Z515 Encounter for palliative care: Secondary | ICD-10-CM

## 2020-01-29 DIAGNOSIS — Z7189 Other specified counseling: Secondary | ICD-10-CM

## 2020-01-29 LAB — COMPREHENSIVE METABOLIC PANEL
ALT: 126 U/L — ABNORMAL HIGH (ref 0–44)
ALT: 120 U/L — ABNORMAL HIGH (ref 0–44)
AST: 123 U/L — ABNORMAL HIGH (ref 15–41)
AST: 99 U/L — ABNORMAL HIGH (ref 15–41)
Albumin: 2.9 g/dL — ABNORMAL LOW (ref 3.5–5.0)
Albumin: 2.9 g/dL — ABNORMAL LOW (ref 3.5–5.0)
Alkaline Phosphatase: 120 U/L (ref 38–126)
Alkaline Phosphatase: 117 U/L (ref 38–126)
Anion gap: 10 (ref 5–15)
Anion gap: 12 (ref 5–15)
BUN: 76 mg/dL — ABNORMAL HIGH (ref 8–23)
BUN: 80 mg/dL — ABNORMAL HIGH (ref 8–23)
CO2: 31 mmol/L (ref 22–32)
CO2: 30 mmol/L (ref 22–32)
Calcium: 9.4 mg/dL (ref 8.9–10.3)
Calcium: 9.3 mg/dL (ref 8.9–10.3)
Chloride: 109 mmol/L (ref 98–111)
Chloride: 107 mmol/L (ref 98–111)
Creatinine, Ser: 2.41 mg/dL — ABNORMAL HIGH (ref 0.61–1.24)
Creatinine, Ser: 2.71 mg/dL — ABNORMAL HIGH (ref 0.61–1.24)
GFR calc Af Amer: 30 mL/min — ABNORMAL LOW (ref >60)
GFR calc Af Amer: 26 mL/min — ABNORMAL LOW (ref >60)
GFR calc non Af Amer: 26 mL/min — ABNORMAL LOW (ref >60)
GFR calc non Af Amer: 23 mL/min — ABNORMAL LOW (ref >60)
Glucose, Bld: 348 mg/dL — ABNORMAL HIGH (ref 70–99)
Glucose, Bld: 391 mg/dL — ABNORMAL HIGH (ref 70–99)
Potassium: 4 mmol/L (ref 3.5–5.1)
Potassium: 4.1 mmol/L (ref 3.5–5.1)
Sodium: 150 mmol/L — ABNORMAL HIGH (ref 135–145)
Sodium: 149 mmol/L — ABNORMAL HIGH (ref 135–145)
Total Bilirubin: 0.5 mg/dL (ref 0.3–1.2)
Total Bilirubin: 0.6 mg/dL (ref 0.3–1.2)
Total Protein: 8.1 g/dL (ref 6.5–8.1)
Total Protein: 7.8 g/dL (ref 6.5–8.1)

## 2020-01-29 LAB — CBC WITH DIFFERENTIAL/PLATELET
Abs Immature Granulocytes: 0.11 10*3/uL — ABNORMAL HIGH (ref 0.00–0.07)
Basophils Absolute: 0 10*3/uL (ref 0.0–0.1)
Basophils Relative: 0 %
Eosinophils Absolute: 0.1 10*3/uL (ref 0.0–0.5)
Eosinophils Relative: 1 %
HCT: 43.5 % (ref 39.0–52.0)
Hemoglobin: 12.6 g/dL — ABNORMAL LOW (ref 13.0–17.0)
Immature Granulocytes: 1 %
Lymphocytes Relative: 9 %
Lymphs Abs: 1.3 10*3/uL (ref 0.7–4.0)
MCH: 23.9 pg — ABNORMAL LOW (ref 26.0–34.0)
MCHC: 29 g/dL — ABNORMAL LOW (ref 30.0–36.0)
MCV: 82.4 fL (ref 80.0–100.0)
Monocytes Absolute: 1.7 10*3/uL — ABNORMAL HIGH (ref 0.1–1.0)
Monocytes Relative: 12 %
Neutro Abs: 11.5 10*3/uL — ABNORMAL HIGH (ref 1.7–7.7)
Neutrophils Relative %: 77 %
Platelets: 216 10*3/uL (ref 150–400)
RBC: 5.28 MIL/uL (ref 4.22–5.81)
RDW: 16.6 % — ABNORMAL HIGH (ref 11.5–15.5)
WBC: 14.7 10*3/uL — ABNORMAL HIGH (ref 4.0–10.5)
nRBC: 0 % (ref 0.0–0.2)

## 2020-01-29 LAB — GLUCOSE, CAPILLARY
Glucose-Capillary: 280 mg/dL — ABNORMAL HIGH (ref 70–99)
Glucose-Capillary: 314 mg/dL — ABNORMAL HIGH (ref 70–99)
Glucose-Capillary: 356 mg/dL — ABNORMAL HIGH (ref 70–99)
Glucose-Capillary: 365 mg/dL — ABNORMAL HIGH (ref 70–99)
Glucose-Capillary: 268 mg/dL — ABNORMAL HIGH (ref 70–99)
Glucose-Capillary: 278 mg/dL — ABNORMAL HIGH (ref 70–99)

## 2020-01-29 LAB — PHOSPHORUS
Phosphorus: 2 mg/dL — ABNORMAL LOW (ref 2.5–4.6)
Phosphorus: 2.2 mg/dL — ABNORMAL LOW (ref 2.5–4.6)

## 2020-01-29 LAB — BASIC METABOLIC PANEL
Anion gap: 10 (ref 5–15)
BUN: 85 mg/dL — ABNORMAL HIGH (ref 8–23)
CO2: 30 mmol/L (ref 22–32)
Calcium: 9.5 mg/dL (ref 8.9–10.3)
Chloride: 108 mmol/L (ref 98–111)
Creatinine, Ser: 2.77 mg/dL — ABNORMAL HIGH (ref 0.61–1.24)
GFR calc Af Amer: 25 mL/min — ABNORMAL LOW (ref >60)
GFR calc non Af Amer: 22 mL/min — ABNORMAL LOW (ref >60)
Glucose, Bld: 294 mg/dL — ABNORMAL HIGH (ref 70–99)
Potassium: 4 mmol/L (ref 3.5–5.1)
Sodium: 148 mmol/L — ABNORMAL HIGH (ref 135–145)

## 2020-01-29 LAB — BLOOD GAS, ARTERIAL
Acid-Base Excess: 8 mmol/L — ABNORMAL HIGH (ref 0.0–2.0)
Bicarbonate: 32.6 mmol/L — ABNORMAL HIGH (ref 20.0–28.0)
Drawn by: 358491
FIO2: 36
O2 Saturation: 99 %
Patient temperature: 37.5
pCO2 arterial: 51.6 mmHg — ABNORMAL HIGH (ref 32.0–48.0)
pH, Arterial: 7.419 (ref 7.350–7.450)
pO2, Arterial: 145 mmHg — ABNORMAL HIGH (ref 83.0–108.0)

## 2020-01-29 LAB — HCV AB W REFLEX TO QUANT PCR: HCV Ab: <0.1 s/co ratio (ref 0.0–0.9)

## 2020-01-29 LAB — LACTIC ACID, PLASMA
Lactic Acid, Venous: 1.8 mmol/L (ref 0.5–1.9)
Lactic Acid, Venous: 1.7 mmol/L (ref 0.5–1.9)

## 2020-01-29 LAB — MAGNESIUM
Magnesium: 2.4 mg/dL (ref 1.7–2.4)
Magnesium: 2.4 mg/dL (ref 1.7–2.4)

## 2020-01-29 LAB — HCV INTERPRETATION

## 2020-01-29 LAB — MRSA PCR SCREENING: MRSA by PCR: NEGATIVE

## 2020-01-29 MED ORDER — SODIUM CHLORIDE 0.45 % IV BOLUS
1000.0000 mL | Freq: Once | INTRAVENOUS | Status: AC
  Administered 2020-01-29: 1000 mL via INTRAVENOUS

## 2020-01-29 MED ORDER — INSULIN GLARGINE 100 UNIT/ML ~~LOC~~ SOLN
50.0000 [IU] | Freq: Every day | SUBCUTANEOUS | Status: DC
  Filled 2020-01-29: qty 0.5

## 2020-01-29 MED ORDER — CHLORHEXIDINE GLUCONATE 0.12 % MT SOLN
15.0000 mL | Freq: Two times a day (BID) | OROMUCOSAL | Status: DC
  Administered 2020-01-29 – 2020-01-30 (×3): 15 mL via OROMUCOSAL
  Filled 2020-01-29 (×2): qty 15

## 2020-01-29 MED ORDER — FREE WATER
250.0000 mL | Status: DC
  Administered 2020-01-29 – 2020-01-30 (×7): 250 mL

## 2020-01-29 MED ORDER — ORAL CARE MOUTH RINSE
15.0000 mL | Freq: Two times a day (BID) | OROMUCOSAL | Status: DC
  Administered 2020-01-30 – 2020-01-31 (×3): 15 mL via OROMUCOSAL

## 2020-01-29 MED ORDER — INSULIN GLARGINE 100 UNIT/ML ~~LOC~~ SOLN
70.0000 [IU] | Freq: Every day | SUBCUTANEOUS | Status: DC
  Administered 2020-01-29: 70 [IU] via SUBCUTANEOUS
  Filled 2020-01-29 (×2): qty 0.7

## 2020-01-29 MED ORDER — ALBUMIN HUMAN 5 % IV SOLN
INTRAVENOUS | Status: AC
  Filled 2020-01-29: qty 250

## 2020-01-29 MED ORDER — AMLODIPINE BESYLATE 10 MG PO TABS
10.0000 mg | ORAL_TABLET | Freq: Every day | ORAL | Status: DC
  Administered 2020-01-29 – 2020-02-17 (×20): 10 mg
  Filled 2020-01-29 (×21): qty 1

## 2020-01-29 MED ORDER — SODIUM CHLORIDE 0.9 % IV SOLN
INTRAVENOUS | Status: DC | PRN
  Administered 2020-01-29 – 2020-02-04 (×2): 250 mL via INTRAVENOUS

## 2020-01-29 MED ORDER — LABETALOL HCL 5 MG/ML IV SOLN
5.0000 mg | INTRAVENOUS | Status: DC | PRN
  Administered 2020-01-29 (×2): 20 mg via INTRAVENOUS
  Administered 2020-01-29 (×2): 10 mg via INTRAVENOUS
  Administered 2020-01-29 – 2020-02-03 (×7): 20 mg via INTRAVENOUS
  Administered 2020-02-08: 10 mg via INTRAVENOUS
  Administered 2020-02-08 (×2): 20 mg via INTRAVENOUS
  Administered 2020-02-08: 10 mg via INTRAVENOUS
  Administered 2020-02-08 – 2020-02-09 (×3): 20 mg via INTRAVENOUS
  Administered 2020-02-09 (×2): 10 mg via INTRAVENOUS
  Administered 2020-02-10 – 2020-02-11 (×3): 20 mg via INTRAVENOUS
  Administered 2020-02-14: 10 mg via INTRAVENOUS
  Administered 2020-02-14 (×2): 20 mg via INTRAVENOUS
  Administered 2020-02-14 (×2): 10 mg via INTRAVENOUS
  Administered 2020-02-14: 20 mg via INTRAVENOUS
  Administered 2020-02-16: 10 mg via INTRAVENOUS
  Filled 2020-01-29 (×27): qty 4

## 2020-01-29 MED ORDER — CLEVIDIPINE BUTYRATE 0.5 MG/ML IV EMUL
0.0000 mg/h | INTRAVENOUS | Status: DC
  Administered 2020-01-29: 2 mg/h via INTRAVENOUS
  Administered 2020-01-29 (×2): 21 mg/h via INTRAVENOUS
  Filled 2020-01-29 (×3): qty 50

## 2020-01-29 MED ORDER — METOPROLOL TARTRATE 50 MG PO TABS
50.0000 mg | ORAL_TABLET | Freq: Two times a day (BID) | ORAL | Status: DC
  Administered 2020-01-29 – 2020-02-12 (×30): 50 mg
  Filled 2020-01-29 (×30): qty 1

## 2020-01-29 MED ORDER — INSULIN ASPART 100 UNIT/ML ~~LOC~~ SOLN
4.0000 [IU] | Freq: Once | SUBCUTANEOUS | Status: AC
  Administered 2020-01-29: 4 [IU] via SUBCUTANEOUS

## 2020-01-29 MED ORDER — INSULIN ASPART 100 UNIT/ML ~~LOC~~ SOLN
5.0000 [IU] | Freq: Once | SUBCUTANEOUS | Status: AC
  Administered 2020-01-29: 5 [IU] via SUBCUTANEOUS

## 2020-01-29 MED ORDER — CHLORHEXIDINE GLUCONATE CLOTH 2 % EX PADS
6.0000 | MEDICATED_PAD | Freq: Every day | CUTANEOUS | Status: DC
  Administered 2020-01-30 – 2020-02-01 (×4): 6 via TOPICAL

## 2020-01-29 MED ORDER — SODIUM CHLORIDE 0.45 % IV SOLN: INTRAVENOUS | Status: DC

## 2020-01-29 MED ORDER — AMLODIPINE BESYLATE 10 MG PO TABS: 10.0000 mg | ORAL_TABLET | Freq: Every day | ORAL | Status: DC

## 2020-01-29 MED ORDER — INSULIN GLARGINE 100 UNIT/ML ~~LOC~~ SOLN
20.0000 [IU] | Freq: Once | SUBCUTANEOUS | Status: AC
  Administered 2020-01-29: 20 [IU] via SUBCUTANEOUS
  Filled 2020-01-29: qty 0.2

## 2020-01-29 MED ORDER — DEXTROSE 5 % IV SOLN: INTRAVENOUS | Status: DC

## 2020-01-29 MED ORDER — FREE WATER
200.0000 mL | Status: DC
  Administered 2020-01-29: 200 mL

## 2020-01-29 MED ORDER — HYDRALAZINE HCL 50 MG PO TABS
50.0000 mg | ORAL_TABLET | Freq: Three times a day (TID) | ORAL | Status: DC
  Administered 2020-01-29 – 2020-01-31 (×6): 50 mg via ORAL
  Filled 2020-01-29 (×6): qty 1

## 2020-01-29 MED ORDER — CLEVIDIPINE BUTYRATE 0.5 MG/ML IV EMUL
0.0000 mg/h | INTRAVENOUS | Status: DC
  Administered 2020-01-29 – 2020-01-30 (×6): 21 mg/h via INTRAVENOUS
  Administered 2020-01-30: 16 mg/h via INTRAVENOUS
  Administered 2020-01-30: 21 mg/h via INTRAVENOUS
  Administered 2020-01-31: 16 mg/h via INTRAVENOUS
  Administered 2020-01-31: 4 mg/h via INTRAVENOUS
  Filled 2020-01-29 (×17): qty 50

## 2020-01-29 NOTE — Progress Notes (Signed)
[name], daughter's patient was informed of the  Transfer of the patient to 4N18, she stated that she will pass on to the rest of the family

## 2020-01-29 NOTE — Progress Notes (Addendum)
HD#4 Subjective:  Overnight Events: Transferred to the Neuro ICU for blood pressure control.   [name] seen at bedside on rounds today. He is somnolent and responds to physical stimulin only.   Objective:  Vital signs in last 24 hours: Vitals:   01/29/20 0440 01/29/20 0445 01/29/20 0500 01/29/20 0515  BP: 137/78 139/73 (!) 144/78 (!) 150/82  Pulse: 83 82 84 91  Resp: (!) 31 (!) 29 (!) 28 (!) 28  Temp:      TempSrc:      SpO2: 96% 95% 94% 95%  Weight:      Height:       Supplemental O2: [name] SpO2: 95 % O2 Flow Rate (L/min): 3 L/min   Physical Exam:  Physical Exam Constitutional:      Appearance: He is obese. He is ill-appearing and diaphoretic.  HENT:     Head: Normocephalic and atraumatic.  Cardiovascular:     Pulses: Normal pulses.     Heart sounds: Normal heart sounds.  Pulmonary:     Effort: Tachypnea present.     Breath sounds: Rales (lower left) present.  Abdominal:     General: Bowel sounds are normal.     Palpations: Abdomen is soft.     Tenderness: There is no abdominal tenderness.  Musculoskeletal:        General: Normal range of motion.     Right lower leg: No edema.     Left lower leg: No edema.  Skin:    General: Skin is warm.  Neurological:     Mental Status: He is disoriented.     Motor: Weakness present.  Psychiatric:     Comments: Patient only responding to physical stimuli. Difficult to arouse.      Filed Weights   01/27/20 0428 01/28/20 0339 01/29/20 0438  Weight: (!) 143 kg (!) 139.4 kg (!) 137 kg     Intake/Output Summary (Last 24 hours) at 01/29/2020 0533 Last data filed at 01/29/2020 0500 Gross per 24 hour  Intake 1119.12 ml  Output 2200 ml  Net -1080.88 ml   Net IO Since Admission: -4,029.15 mL [01/29/20 0533]  Pertinent Labs: CBC Latest Ref Rng & Units 01/28/2020 01/27/2020 01/26/2020  WBC 4.0 - 10.5 K/uL 14.2(H) 12.2(H) 11.4(H)  Hemoglobin 13.0 - 17.0 g/dL 12.0(L) 11.2(L) 12.1(L)  Hematocrit 39 - 52 % 41.2  38.3(L) 40.2  Platelets 150 - 400 K/uL 201 192 198    CMP Latest Ref Rng & Units 01/28/2020 01/27/2020 01/26/2020  Glucose 70 - 99 mg/dL 217(H) 225(H) 221(H)  BUN 8 - 23 mg/dL 72(H) 77(H) 75(H)  Creatinine 0.61 - 1.24 mg/dL 2.39(H) 2.66(H) 2.37(H)  Sodium 135 - 145 mmol/L 144 139 138  Potassium 3.5 - 5.1 mmol/L 3.1(L) 3.4(L) 3.5  Chloride 98 - 111 mmol/L 101 95(L) 95(L)  CO2 22 - 32 mmol/L 31 30 28   Calcium 8.9 - 10.3 mg/dL 9.1 9.0 9.3  Total Protein 6.5 - 8.1 g/dL 8.4(H) 8.0 8.0  Total Bilirubin 0.3 - 1.2 mg/dL 0.5 0.6 0.6  Alkaline Phos 38 - 126 U/L 116 121 131(H)  AST 15 - 41 U/L 33 31 52(H)  ALT 0 - 44 U/L 74(H) 83(H) 119(H)    Pending Labs: 1. Blood cultures  Imaging:  EEG adult This study is suggestive of moderate diffuse encephalopathy, nonspecific etiology.  No seizures or definite epileptiform discharges were seen throughout the recording.  Assessment/Plan:   Principal Problem:   ICH (intracerebral hemorrhage) [facility])   Patient Summary: Patient  is a 74 year old male with past medical history significant for prior intracranial hemorrhage, HFpEF, CKD stage IV, hypertension, diabetes mellitus who presented for evaluation of headache and was found to havebilateralintraventricular hemorrhage.  #Metabolic encephlopathy, multifactorial: Likely secondary to a combination of aspiration pneumonia, ICH, and metabolic derangements.   # Bilateral intraventricular hemorrhage ICH stable as of CT scan yesterday. He remains minimally responsive. NG tube placed yesterday for PO antiHTN medicaitons but did not effectively control his HTN.  Transferred to [facility] icu for blood pressure control with IV cleviprex.  Blood pressures have been less than 140 SBP but continues to have periods of elevated blood pressure.  - Continue blood pressure management with cleviprex - Repeat CT scan tomorrow morning per neurology - Appreciate Neuros assistance with this patient   #Aspiraiton  pneumonia: #SHOB: #Leukocytosis: Patient is diaphorectic, with a leukocytsis and XR showing signs of pneumonia. Patient is on day 3 of antibiotic therapy and just switch to Unasyn last night. WBC increased to 14.7 today with fever as high as 100.6 today.  - Blood cultures pending - Continue Unasyn IV  - Supplemental oxygen as needed   # Hypertension Transferred to the ICU for blood pressure control - Continue cleviprex to maintain SBP < 140 - NG tube placed will try to transition to PO medications when possible.   #T2DM Patient started on tube feeds once NG tube was placed last night. Blood sugars have been significantly elevated. Patient is on SSI resistant and 70 units lantus qHS for DM. He received 19 units of insulin of aspart this morning, but blood sugars remained elevated in the 300's. I gave him an additional 5 units of aspart and 20 units of lantus this morning. Keeping a close eye on his blood sugars. His elevated blood sugar is likely secondary to restarting tube feeds and current pneumonia.  - Repeat blood sugars q4 hours - Continue SSI resistant - Continue Lantus 70 units q HS - Will titrate insulin per patients needs.   #Hypernatremia: Patient developed significant hypernatremia overnight, likely secondary to glucose naturesis. He has a water deficit of almost 8 L. He will get roughly 2 L back with tube feeds. I will start him on 200 cc free water pushes q 4 hour and D5W 100 cc/hr.  - Recheck metabolic panel this afternoon to reassess Na - Continue 200 cc free water q4 hours per tube - D5W 100 cc/hr - repeat CMP  #AKI on CKD: Patient Cr worsened overnight. Likely secondary to volume depletion.  - Continue to monitor closely.   #Hepatocellular injury: - Continue to monitor hepatic function daily.   Diet: Tube Feeds IVF: D5W,100cc/hr VTE: SCDs Code: Full PT/OT recs: Pending TOC recs: pending   Dispo: Anticipated discharge pending further evaluation and  management.    Marianna Payment, D.O. [facility], PGY-1 Date 01/29/2020 Time 5:33 AM Pager: [phone] Please contact the on call pager after 5 pm and on weekends at [phone].

## 2020-01-29 NOTE — Progress Notes (Signed)
Considering the patient has continued to decline since admission with concern for respiratory decompensation in the near future, PCCM was called. They stated that they would recommend avoiding intubation if possible considering his ABG does not show significant hypercarbia and he is at a significantly higher risk for requiring tracheostomy. Repeat CXR today does not show worsening pneumonia /aspiration, but I am concerned that the patient is declining and may have a difficult time protecting his airway in the near future. We will continue to monitor the patient closely for respiratory decompensation and call PCCM for intubation if he can no longer protect his airway.  Marianna Payment, D.O. [facility], PGY-1 Pager: [phone], Phone: [phone] Date 01/29/2020 Time 3:07 PM

## 2020-01-29 NOTE — Progress Notes (Addendum)
BP Readings from Last 3 Encounters:  01/29/20 (!) 162/93  01/16/20 (!) 152/106  11/05/19 (!) 156/74    Event: Paged by RN for BP above goal despite labetalol 10 mg IV x2  Assessment/Plan: Patient received labetalol 10 mg IV at 2346 and 0238. On review of past BP readings, patient with frequent readings > 160 indicating inadequate control. Neurology note today recommends control with SBP < 140. Patient started receiving additional BP medication this evening via CorTrak.   Case discussed with Dr. Cheral Marker who recommends starting patient on clevidipine drip for strict BP control. Spoke with RN who informs that unit is unable to perform clevidipine drip. Transfer request placed to 4 N ICU, clevidipine drip ordered with goal SBP < 140. Verified bed availability with bed placement.  Jeanmarie Hubert, MD 01/29/2020, 3:42 AM

## 2020-01-29 NOTE — Progress Notes (Signed)
SLP Cancellation Note  Patient Details Name: [name] MRN: [id number] DOB: 02/13/1946   Cancelled treatment:       Reason Eval/Treat Not Completed: Fatigue/lethargy limiting ability to participate. RN says pt is too lethargic for PO trials this morning. Will f/u as able.    Osie Bond., M.A. [facility] Pager [phone] Office [phone]  01/29/2020, 10:02 AM

## 2020-01-29 NOTE — Progress Notes (Signed)
STROKE TEAM PROGRESS NOTE   INTERVAL HISTORY No family at bedside. Pt transferred to ICU for BP management. Pt still obtunded, not open eyes on voice, barely open eyes on pain, not following commands. Seems to have left gaze preference today. Moving b/l UEs equally to pain, did not move b/l LEs on pain. On cleviprex. Had cortrak, will put on po BP meds.   OBJECTIVE Vitals:   01/29/20 0630 01/29/20 0645 01/29/20 0700 01/29/20 0800  BP: 135/77 131/71 137/66 (!) 148/70  Pulse: 89 89 92 92  Resp: (!) 34 (!) 26 (!) 27 (!) 34  Temp:    99.6 F (37.6 C)  TempSrc:      SpO2: 94% 96% 96% 95%  Weight:      Height:       CBC:  Recent Labs  Lab 01/28/20 0928 01/29/20 0748  WBC 14.2* 14.7*  NEUTROABS  --  11.5*  HGB 12.0* 12.6*  HCT 41.2 43.5  MCV 81.7 82.4  PLT 201 756   Basic Metabolic Panel:  Recent Labs  Lab 01/28/20 0928 01/28/20 1420 01/28/20 1854 01/29/20 0748  NA 144  --   --  150*  K 3.1*  --   --  4.0  CL 101  --   --  109  CO2 31  --   --  31  GLUCOSE 217*  --   --  348*  BUN 72*  --   --  76*  CREATININE 2.39*  --   --  2.41*  CALCIUM 9.1  --   --  9.4  MG  --    < > 2.1 2.4  PHOS  --    < > 3.0 2.0*   < > = values in this interval not displayed.   Lipid Panel:     Component Value Date/Time   CHOL 103 01/27/2020 0317   CHOL 129 01/05/2017 1235   TRIG 68 01/27/2020 0317   HDL 31 (L) 01/27/2020 0317   HDL 36 (L) 01/05/2017 1235   CHOLHDL 3.3 01/27/2020 0317   VLDL 14 01/27/2020 0317   LDLCALC 58 01/27/2020 0317   LDLCALC 71 01/05/2017 1235   HgbA1c:  Lab Results  Component Value Date   HGBA1C 8.9 (H) 01/26/2020   Urine Drug Screen:     Component Value Date/Time   LABOPIA NONE DETECTED 10/01/2013 1915   COCAINSCRNUR NONE DETECTED 10/01/2013 1915   COCAINSCRNUR NEG 12/16/2008 2246   LABBENZ NONE DETECTED 10/01/2013 1915   LABBENZ NEG 12/16/2008 2246   AMPHETMU NONE DETECTED 10/01/2013 1915   THCU NONE DETECTED 10/01/2013 1915   LABBARB NONE  DETECTED 10/01/2013 1915    Alcohol Level     Component Value Date/Time   ETH <11 10/01/2013 1504    IMAGING EEG adult  Result Date: 01/28/2020 Ryan Havens, MD     01/28/2020  1:17 PM Patient Name: [name] MRN: [id number] Epilepsy Attending: Lora Fletcher Referring Physician/Provider: Dr. Rosalin Fletcher Date: 01/28/2020 Duration: 23.17 minutes Patient history: 74 year old male who presented with altered mental status and was found to have bilateral lateral ventricle [name].  EEG evaluate for seizures. Level of alertness: Lethargic AEDs during EEG study: None Technical aspects: This EEG study was done with scalp electrodes positioned according to the 10-20 International system of electrode placement. Electrical activity was acquired at a sampling rate of 500Hz  and reviewed with a high frequency filter of 70Hz  and a low frequency filter of 1Hz . EEG data were recorded continuously and  digitally stored. Description: No clear posterior dominant rhythm was seen.  EEG showed continuous generalized 5 to 9 Hz theta-alpha activity.  Hyperventilation and photic stimulation were not performed.   ABNORMALITY -Continuous slow, generalized IMPRESSION: This study is suggestive of moderate diffuse encephalopathy, nonspecific etiology.  No seizures or definite epileptiform discharges were seen throughout the recording. [name]     PHYSICAL EXAM  Temp:  [98.1 F (36.7 C)-100.2 F (37.9 C)] 99.6 F (37.6 C) (06/15 0800) Pulse Rate:  [73-102] 92 (06/15 0800) Resp:  [22-50] 34 (06/15 0800) BP: (131-181)/(66-101) 148/70 (06/15 0800) SpO2:  [85 %-96 %] 95 % (06/15 0800) Weight:  [628 kg] 137 kg (06/15 0438)  General - obese, well developed, obtunded.  Ophthalmologic - fundi not visualized due to noncooperation.  Cardiovascular - Regular rhythm and rate.  Neuro - obtunded, barely open eyes with painful stimuli, moaning to pain, nonverbal, not following commands. Bilateral eyes s/p surgery, no  identified pupils bilaterally. Eye left gaze position, sluggish doll's eyes. Positive gag cough and corneal. No clear facial droop. Tongue protrusion not cooperative. Not blinking to visual threat with eyes opening (legally blind at baseline). With pain stimulation, purposeful against gravity movement BUEs, however, slight withdraw BLEs. Left anterior half foot amputation. DTR 1+ and no babinski on the right. Sensation, coordination and gait not tested.    ASSESSMENT/PLAN [name] is a 74 y.o. male with history of atrial fibrillation not on anticoagulation due to history of rectal bleeding (on ASA), cardiomyopathy, PPM, coronary disease, hypertension, legally blind, diabetes type 2, hypertension, hyperlipidemia and morbid obesity  presenting with nausea, vomiting, headache, recent fall, recent syncope, and AMS. He did not receive IV t-PA due to [place].  [name] - bilateral lateral ventricle small [name], etiology unclear, could be traumatic due to fall vs. spontaneous  CT Head - 01/25/20 - There is hemorrhage in the dependent portion of each lateral ventricle of uncertain etiology.   CT head - 01/26/20 - Unchanged intraventricular blood layering within the occipital horns of the lateral ventricles.   CT head 01/27/20 - slight increase of [name], borderline increase in ventriculomegaly  CT head repeat 01/28/20 - unchanged  MRI head - not able to do due to incompatible PPM   CT repeat pending in am  Carotid Doppler - unremarkable  2D Echo - EF 60-65%  [employer] Virus 2 - negative  LDL - 58  HgbA1c - 8.9  VTE prophylaxis - SCDs  aspirin 81 mg daily prior to admission, now on No antithrombotic due to [name]  Therapy recommendations:  SNF  Disposition:  Pending  Metabolic encephalopathy  Pt sleepy drowsy out of proportion for brain pathology - however, it could be the case if pt had baseline cognitive impairment   Etiology unclear  Not on sedation  Elevated LFT - AST/ALT 52/119  ->33/74->123/126  Elevated ammonia - 42->36  CKD IV with elevated BUN 75->77->72->76 and Cre 2.37->2.66->2.39->2.41  EEG continuous slowing, no seizure  Management per primary team  Aspiration PNA Leukocytosis  WBC 11.4->12.2->14.7  On Unasyn 6/14>>   afebrile so far  Hypertensive emergency  Home BP meds: amlodipine ; apresoline ; metoprolol ; Demadex ; Zaroxolyn   Current BP meds: amlodipine ; hydralazine ; Demadex; metoprolol -> restart po meds with amlodipine and metoprolol . on Cleviprex . SBP goal < 160 mm Hg now since stable [name] on repeat CT . Long-term BP goal normotensive  Hyperlipidemia  Home Lipid lowering medication: Crestor 20 mg daily  LDL  58, goal < 70  Hold statin for now due to elevated LFT and [name]  Diabetes  Home diabetic meds: insulin  Current diabetic meds: lantus  HgbA1c 8.9, goal < 7.0  Hyperglycemia  SSI  CBG monitoring  DB RN following  Close PCP follow-up for better DM control  PAF  Not on AC due to [place] in 2015 (see below)  Not AC candidate now due to [name]  On ASA PTA  Hx of ICH  10/2013 - admitted for AMS found to have left temporoparietal ICH and edema. Concerning for hemorrhagic conversion but not able to have MRI done. Also had SOB due to PE from mobile mass (most likely clot vs unlikely vegetation) found on pacer wire in RA extending into RV. Not an anticoagulation candidate for treatment due to [place]. Placed on empiric abs for possible endocarditis/RA vegetation on wire - completed course ceftriaxone on 2/26. On aspirin 81 mg orally every day prior to admission.  Now on aspirin 81 mg orally every day.   Dysphagia  Not able to pass swallow due to altered mental status  n.p.o.  Cortrak 6/14 and on TF @ 70  On FW 200 Q4  On D5 @ 100  Speech on board  Other Stroke Risk Factors  Advanced age  Former cigarette smoker - quit  ETOH use, advised to drink no more than 1 alcoholic beverage per day.  Obesity, Body  mass index is 38.78 kg/m., recommend weight loss, diet and exercise as appropriate   Coronary artery disease  Obstructive sleep apnea, on CPAP at home  Congestive Heart Failure s/p pacer  Other Active Problems  Legally blind - b/l eye surgery  PPM for bradycardia  Hypernatremia 139->144->150  Hypophosphatemia 2.0  Hospital day # 4  This patient is critically ill due to obtundation, hypertensive emergency, [name], AKI on CKD, hypernatremia and at significant risk of neurological worsening, death form increased ICH, stroke, renal failure, seizure, heart failure. This patient's care requires constant monitoring of vital signs, hemodynamics, respiratory and cardiac monitoring, review of multiple databases, neurological assessment, discussion with family, other specialists and medical decision making of high complexity. I spent 35 minutes of neurocritical care time in the care of this patient.   Ryan Hawking, MD PhD Stroke Neurology 01/29/2020 9:43 AM    To contact Stroke Continuity provider, please refer to [email or web address] After hours, contact General Neurology

## 2020-01-29 NOTE — Progress Notes (Signed)
Internal medicine paged regarding patient's increase respiratory effort.  MD will come to bedside to assess patient.

## 2020-01-29 NOTE — Consult Note (Signed)
Consultation Note Date: 01/29/2020   Patient Name: [name]  DOB: 02-01-1946  MRN: [id number]  Age / Sex: 74 y.o., male  PCP: [name], MD Referring Physician: Aldine Contes, MD  Reason for Consultation: Establishing goals of care  HPI/Patient Profile: 74 y.o. male  with past medical history of stroke requiring trach/PEG previously, HFpEF, atrial fibrillation, severe aortic stenosis s/p TAVR, CKD stage IV, hypertension, OHS/OSA uses CPAP, diabetes, s/p left foot amputation admitted on 01/25/2020 with ongoing headache and CT head bilateral intraventricular hemorrhage. Hospitalization complicated by ongoing poor mental status, aspiration pneumonia, hypertension, and concern for airway protection.   Clinical Assessment and Goals of Care: I met today at [name]' bedside. He is unresponsive. Concern for respiratory compromise and airway protection. Audible secretions and labored breathing with tachypnea and accessory muscle use. I suctioned his mouth with yankeur and he did push away yankeur with his tongue but did not have gag reflex.   I called and spoke with daughter, [name]. [name] reports that there are 5 children in total but she is HCPOA. She tells me about her father's status prior to hospitalization as very independent with some assistance mainly due to his blindness. He has an aide in morning and evening that assist with light cleaning, meal preparation, and assisting him into shower as this is not easily or handicapped accessible. He is able to ambulate with cane, bathe self, feed self, and even cut up fruit (although [name] reports this makes her nervous). He is much declined from his baseline where he had good quality of life.   We discussed current severity of illness and poor prognosis. We discussed his wishes and she reports that they have had those conversations and that he has  expressed to her previously that he would desire resuscitation but would not want to live on life support. He would desire at a minimum to be able to have interaction with his family. He would accept living in facility. We did discuss that this quality of life for meaningful interaction may very well be unattainable given previous stroke and now with bilateral intracranial hemorrhage this may very well lead to end of life as he appears to be worsening instead of improving even with aggressive care. If he requires intubation that will mean that he has further declined. [name] understands. With worsening status she may be open to limitations but for now wishes to respect his wishes. This is also with the context that he has previously had trach/PEG after his stroke and recovered to return home and fairly independent.   All questions/concerns addressed. Emotional support provided.   Primary Decision Maker HCPOA daughter [name]     SUMMARY OF RECOMMENDATIONS   - Continue with aggressive care for now - Ongoing goals of care conversations  Code Status/Advance Care Planning:  Full code   Symptom Management:   Per attending.   Palliative Prophylaxis:   Aspiration, Bowel Regimen, Delirium Protocol, Frequent Pain Assessment, Oral Care and Turn Reposition  Additional Recommendations (Limitations, Scope, Preferences):  Full Scope Treatment  Psycho-social/Spiritual:   Desire for further Chaplaincy support:no  Additional Recommendations: Grief/Bereavement Support  Prognosis:   Overall prognosis very poor with bilateral intracranial hemorrhage and likely ongoing aspiration.   Discharge Planning: To Be Determined      Primary Diagnoses: Present on Admission: . ICH (intracerebral hemorrhage) ([place])   I have reviewed the medical record, interviewed the patient and family, and examined the patient. The following aspects are pertinent.  Past Medical History:  Diagnosis Date  .  Arthritis    "all over" (06/23/2013)  . Atrial fibrillation ([place])    not Candidate for Coumadin d/t rectal bleeding  . Benign prostatic hypertrophy   . Cardiomyopathy, hypertensive ([place])     diastolic dysfunction by 8889 echo  . Coronary artery disease     nonobstructive. 2 vessel. cardiac catheter in March 2005 showing 60% LAD occlusion, 30% circumflex occlusion.  . Degenerative joint disease     Right knee.  . Diabetes type 2, uncontrolled ([place])   . Diabetic peripheral neuropathy ([place])   . Diabetic ulcer of thigh ([place])    H/O Rt thigh ulcer.  . Exertional shortness of breath    "comes and goes" (06/23/2013)  . GERD (gastroesophageal reflux disease)   . Gout   . Herpes   . Hyperlipidemia   . Hypertension   . Hypotension 09/05/2013  . Incarcerated ventral hernia    H/O. S/P repair ventral hernia repair 07/2005.  Marland Kitchen LBBB (left bundle branch block)   . Legally blind   . Morbid obesity ([place])   . Obstructive sleep apnea    On CPAP. Last sleep study (06/2009) - Severe obstructive sleep apnea/hypopnea syndrome, apnea-hypopnea index 70.5 per hour with non positional events moderately loud snoring and oxygen desaturation to a nadir of 85% on room air.  . Occult blood positive stool     History of in August 2007.  colonoscopy in January 2008 showing normal colon with fair inadequate prep. By Dr. Silvano Rusk.  . 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 Sepsis ([place]) 08/26/2013   Social History   Socioeconomic History  . Marital status: Divorced    Spouse name: Not on file  . Number of children: 4  . Years of education: 34  . Highest education level: 12th grade  Occupational History  . Occupation: disabled  Tobacco Use  . Smoking status: Former Smoker    Packs/day: 0.00    Years: 0.00    Pack years: 0.00    Types: Cigarettes    Quit date: 08/17/2003    Years since quitting: 16.4  . Smokeless tobacco: Never Used  . Tobacco comment: started at age 28.  only smoked on occ-  very rarely.0  Vaping Use  . Vaping Use: Never used  Substance and Sexual Activity  . Alcohol use: Yes    Comment: 06/23/2013 "drank some; never had a problem w/it"  . Drug use: No  . Sexual activity: Never  Other Topics Concern  . Not on file  Social History Narrative   12/10- reports issues with housing- states it is not disability friendly but has ramp, first floor apt, and has grab bars/assistive devices provided by [facility].  Reports that he has concerns with having enough food but gets meals on wheels, eats at [facility] 3x a week, and has personal aid who cooks and shops for him.  Reports concerns with transportation but gets [facility] transport for all of his medical appts and his family or an  aid helps him with other transportation needs.   Social Determinants of Health   Financial Resource Strain:   . Difficulty of Paying Living Expenses:   Food Insecurity:   . Worried About Charity fundraiser in the Last Year:   . Arboriculturist in the Last Year:   Transportation Needs:   . Film/video editor (Medical):   Marland Kitchen Lack of Transportation (Non-Medical):   Physical Activity:   . Days of Exercise per Week:   . Minutes of Exercise per Session:   Stress:   . Feeling of Stress :   Social Connections:   . Frequency of Communication with Friends and Family:   . Frequency of Social Gatherings with Friends and Family:   . Attends Religious Services:   . Active Member of Clubs or Organizations:   . Attends Archivist Meetings:   Marland Kitchen Marital Status:    Family History  Problem Relation Age of Onset  . Coronary artery disease Sister   . Heart disease Sister   . Allergies Sister   . Asthma Brother   . Rheum arthritis Brother    Scheduled Meds: . amLODipine  10 mg Per Tube Daily  . Chlorhexidine Gluconate Cloth  6 each Topical Daily  . feeding supplement (PRO-STAT SUGAR FREE 64)  30 mL Per Tube TID  . free water  250 mL Per Tube Q4H  . insulin aspart  0-20 Units Subcutaneous Q4H  .  insulin glargine  50 Units Subcutaneous QHS  . metoprolol tartrate  50 mg Per Tube BID  . sodium chloride flush  3 mL Intravenous Once   Continuous Infusions: . sodium chloride Stopped (01/29/20 1057)  . ampicillin-sulbactam (UNASYN) IV Stopped (01/29/20 0723)  . clevidipine 21 mg/hr (01/29/20 1200)  . dextrose Stopped (01/29/20 1153)  . feeding supplement (OSMOLITE 1.5 CAL) 70 mL/hr at 01/29/20 1200  . ondansetron (ZOFRAN) IV     PRN Meds:.sodium chloride, ondansetron (ZOFRAN) IV No Known Allergies Review of Systems  Unable to perform ROS: Acuity of condition    Physical Exam Vitals and nursing note reviewed.  Constitutional:      General: He is in acute distress.     Appearance: He is morbidly obese. He is ill-appearing.  Cardiovascular:     Rate and Rhythm: Normal rate.  Pulmonary:     Effort: Tachypnea and accessory muscle usage present.     Breath sounds: Decreased breath sounds and rales present.  Abdominal:     Palpations: Abdomen is soft.  Neurological:     Mental Status: He is unresponsive.     Vital Signs: BP (!) 159/70 (BP Location: Left Arm)   Pulse 81   Temp (!) 100.6 F (38.1 C)   Resp (!) 30   Ht _0  (1.88 m)   Wt (!) 137 kg   SpO2 94%   BMI 38.78 kg/m  Pain Scale: CPOT   Pain Score: 0-No pain   SpO2: SpO2: 94 % O2 Device:SpO2: 94 % O2 Flow Rate: .O2 Flow Rate (L/min): 4 L/min  IO: Intake/output summary:   Intake/Output Summary (Last 24 hours) at 01/29/2020 1320 Last data filed at 01/29/2020 1200 Gross per 24 hour  Intake 2018.56 ml  Output 2275 ml  Net -256.44 ml    LBM: Last BM Date: 01/24/20 Baseline Weight: Weight: (!) 149.7 kg Most recent weight: Weight: (!) 137 kg     Palliative Assessment/Data:     Time In: 1500 Time Out: 1600 Time Total:  60 min Greater than 50%  of this time was spent counseling and coordinating care related to the above assessment and plan.  Signed by: Vinie Sill, NP Palliative Medicine  Team Pager # [phone] (M-F 8a-5p) Team Phone # [phone] (Nights/Weekends)

## 2020-01-29 NOTE — Progress Notes (Signed)
Neurology paged regarding blood pressure parameter.  Systolic blood pressure goal is now less than 160.

## 2020-01-29 NOTE — Progress Notes (Signed)
Inpatient Diabetes Program Recommendations  AACE/ADA: New Consensus Statement on Inpatient Glycemic Control (2015)  Target Ranges:  Prepandial:   less than 140 mg/dL      Peak postprandial:   less than 180 mg/dL (1-2 hours)      Critically ill patients:  140 - 180 mg/dL   Lab Results  Component Value Date   GLUCAP 365 (H) 01/29/2020   HGBA1C 8.9 (H) 01/26/2020    Review of Glycemic Control Results for LEITH, SZAFRANSKI (MRN [id number]) as of 01/29/2020 13:04  Ref. Range 01/28/2020 23:37 01/29/2020 03:44 01/29/2020 07:52 01/29/2020 09:43 01/29/2020 11:19  Glucose-Capillary Latest Ref Range: 70 - 99 mg/dL 279 (H) 280 (H) 314 (H) 356 (H) 365 (H)   Diabetes history:  DM2  Outpatient Diabetes medications:  Semglee 100 units daily Victoza 1.8 mg   Current orders for Inpatient glycemic control:  Lantus 50 units daily (increased tonight from 20 units daily) Novolog 0-20 Q4H Osmolite 1.5 @70ml /hr  Inpatient Diabetes Program Recommendations:     Novolog 3 units Q4H tube feed coverage.  Stop is tube feeds held or discontinued.  Will continue to follow while inpatient.  Thank you, Reche Dixon, RN, BSN Diabetes Coordinator Inpatient Diabetes Program [phone] (team pager from 8a-5p)

## 2020-01-30 ENCOUNTER — Inpatient Hospital Stay (HOSPITAL_COMMUNITY): Payer: Medicare (Managed Care)

## 2020-01-30 DIAGNOSIS — R0602 Shortness of breath: Secondary | ICD-10-CM

## 2020-01-30 DIAGNOSIS — R0603 Acute respiratory distress: Secondary | ICD-10-CM

## 2020-01-30 DIAGNOSIS — E8770 Fluid overload, unspecified: Secondary | ICD-10-CM

## 2020-01-30 DIAGNOSIS — I629 Nontraumatic intracranial hemorrhage, unspecified: Secondary | ICD-10-CM

## 2020-01-30 DIAGNOSIS — Z515 Encounter for palliative care: Secondary | ICD-10-CM

## 2020-01-30 DIAGNOSIS — R509 Fever, unspecified: Secondary | ICD-10-CM

## 2020-01-30 LAB — CBC
HCT: 41.4 % (ref 39.0–52.0)
Hemoglobin: 11.6 g/dL — ABNORMAL LOW (ref 13.0–17.0)
MCH: 23.6 pg — ABNORMAL LOW (ref 26.0–34.0)
MCHC: 28 g/dL — ABNORMAL LOW (ref 30.0–36.0)
MCV: 84.3 fL (ref 80.0–100.0)
Platelets: 159 10*3/uL (ref 150–400)
RBC: 4.91 MIL/uL (ref 4.22–5.81)
RDW: 16.7 % — ABNORMAL HIGH (ref 11.5–15.5)
WBC: 15 10*3/uL — ABNORMAL HIGH (ref 4.0–10.5)
nRBC: 0 % (ref 0.0–0.2)

## 2020-01-30 LAB — POCT I-STAT 7, (LYTES, BLD GAS, ICA,H+H)
Acid-Base Excess: 8 mmol/L — ABNORMAL HIGH (ref 0.0–2.0)
Bicarbonate: 33.6 mmol/L — ABNORMAL HIGH (ref 20.0–28.0)
Calcium, Ion: 1.25 mmol/L (ref 1.15–1.40)
HCT: 38 % — ABNORMAL LOW (ref 39.0–52.0)
Hemoglobin: 12.9 g/dL — ABNORMAL LOW (ref 13.0–17.0)
O2 Saturation: 97 %
Patient temperature: 98.6
Potassium: 4.1 mmol/L (ref 3.5–5.1)
Sodium: 150 mmol/L — ABNORMAL HIGH (ref 135–145)
TCO2: 35 mmol/L — ABNORMAL HIGH (ref 22–32)
pCO2 arterial: 50.1 mmHg — ABNORMAL HIGH (ref 32.0–48.0)
pH, Arterial: 7.435 (ref 7.350–7.450)
pO2, Arterial: 86 mmHg (ref 83.0–108.0)

## 2020-01-30 LAB — COMPREHENSIVE METABOLIC PANEL
ALT: 98 U/L — ABNORMAL HIGH (ref 0–44)
AST: 55 U/L — ABNORMAL HIGH (ref 15–41)
Albumin: 2.8 g/dL — ABNORMAL LOW (ref 3.5–5.0)
Alkaline Phosphatase: 105 U/L (ref 38–126)
Anion gap: 11 (ref 5–15)
BUN: 94 mg/dL — ABNORMAL HIGH (ref 8–23)
CO2: 29 mmol/L (ref 22–32)
Calcium: 9.1 mg/dL (ref 8.9–10.3)
Chloride: 108 mmol/L (ref 98–111)
Creatinine, Ser: 3.17 mg/dL — ABNORMAL HIGH (ref 0.61–1.24)
GFR calc Af Amer: 22 mL/min — ABNORMAL LOW (ref >60)
GFR calc non Af Amer: 19 mL/min — ABNORMAL LOW (ref >60)
Glucose, Bld: 288 mg/dL — ABNORMAL HIGH (ref 70–99)
Potassium: 4.1 mmol/L (ref 3.5–5.1)
Sodium: 148 mmol/L — ABNORMAL HIGH (ref 135–145)
Total Bilirubin: 0.5 mg/dL (ref 0.3–1.2)
Total Protein: 7.6 g/dL (ref 6.5–8.1)

## 2020-01-30 LAB — GLUCOSE, CAPILLARY
Glucose-Capillary: 264 mg/dL — ABNORMAL HIGH (ref 70–99)
Glucose-Capillary: 270 mg/dL — ABNORMAL HIGH (ref 70–99)
Glucose-Capillary: 274 mg/dL — ABNORMAL HIGH (ref 70–99)
Glucose-Capillary: 283 mg/dL — ABNORMAL HIGH (ref 70–99)
Glucose-Capillary: 313 mg/dL — ABNORMAL HIGH (ref 70–99)
Glucose-Capillary: 324 mg/dL — ABNORMAL HIGH (ref 70–99)
Glucose-Capillary: 351 mg/dL — ABNORMAL HIGH (ref 70–99)

## 2020-01-30 MED ORDER — ACETAMINOPHEN 160 MG/5ML PO SOLN
650.0000 mg | Freq: Four times a day (QID) | ORAL | Status: DC | PRN
  Administered 2020-01-30 – 2020-03-03 (×12): 650 mg
  Filled 2020-01-30 (×12): qty 20.3

## 2020-01-30 MED ORDER — HEPARIN SODIUM (PORCINE) 5000 UNIT/ML IJ SOLN
5000.0000 [IU] | Freq: Three times a day (TID) | INTRAMUSCULAR | Status: DC
  Administered 2020-01-30 – 2020-02-28 (×86): 5000 [IU] via SUBCUTANEOUS
  Filled 2020-01-30 (×83): qty 1

## 2020-01-30 MED ORDER — INSULIN DETEMIR 100 UNIT/ML ~~LOC~~ SOLN
40.0000 [IU] | Freq: Two times a day (BID) | SUBCUTANEOUS | Status: DC
  Administered 2020-01-30: 40 [IU] via SUBCUTANEOUS
  Filled 2020-01-30 (×2): qty 0.4

## 2020-01-30 MED ORDER — INSULIN DETEMIR 100 UNIT/ML ~~LOC~~ SOLN
50.0000 [IU] | Freq: Two times a day (BID) | SUBCUTANEOUS | Status: DC
  Administered 2020-01-30: 50 [IU] via SUBCUTANEOUS
  Filled 2020-01-30 (×2): qty 0.5

## 2020-01-30 MED ORDER — SODIUM CHLORIDE 0.45 % IV BOLUS
1000.0000 mL | Freq: Once | INTRAVENOUS | Status: AC
  Administered 2020-01-30: 1000 mL via INTRAVENOUS

## 2020-01-30 MED ORDER — INSULIN GLARGINE 100 UNIT/ML ~~LOC~~ SOLN: 45.0000 [IU] | Freq: Two times a day (BID) | SUBCUTANEOUS | Status: DC

## 2020-01-30 MED ORDER — INSULIN ASPART 100 UNIT/ML ~~LOC~~ SOLN
0.0000 [IU] | SUBCUTANEOUS | Status: DC
  Administered 2020-01-30: 20 [IU] via SUBCUTANEOUS
  Administered 2020-01-30: 15 [IU] via SUBCUTANEOUS
  Administered 2020-01-30 – 2020-01-31 (×3): 11 [IU] via SUBCUTANEOUS
  Administered 2020-01-31: 15 [IU] via SUBCUTANEOUS
  Administered 2020-01-31 (×3): 11 [IU] via SUBCUTANEOUS
  Administered 2020-02-01: 4 [IU] via SUBCUTANEOUS
  Administered 2020-02-01: 7 [IU] via SUBCUTANEOUS
  Administered 2020-02-01: 4 [IU] via SUBCUTANEOUS
  Administered 2020-02-01: 11 [IU] via SUBCUTANEOUS
  Administered 2020-02-02 (×3): 3 [IU] via SUBCUTANEOUS
  Administered 2020-02-02: 4 [IU] via SUBCUTANEOUS
  Administered 2020-02-02 – 2020-02-03 (×6): 3 [IU] via SUBCUTANEOUS
  Administered 2020-02-03 – 2020-02-04 (×2): 4 [IU] via SUBCUTANEOUS
  Administered 2020-02-04: 3 [IU] via SUBCUTANEOUS
  Administered 2020-02-04 (×2): 4 [IU] via SUBCUTANEOUS
  Administered 2020-02-04 (×2): 3 [IU] via SUBCUTANEOUS
  Administered 2020-02-04 – 2020-02-05 (×2): 4 [IU] via SUBCUTANEOUS
  Administered 2020-02-05: 7 [IU] via SUBCUTANEOUS
  Administered 2020-02-05 (×3): 3 [IU] via SUBCUTANEOUS
  Administered 2020-02-05 – 2020-02-06 (×4): 4 [IU] via SUBCUTANEOUS
  Administered 2020-02-07 – 2020-02-08 (×4): 3 [IU] via SUBCUTANEOUS
  Administered 2020-02-08 – 2020-02-09 (×9): 4 [IU] via SUBCUTANEOUS
  Administered 2020-02-09: 3 [IU] via SUBCUTANEOUS
  Administered 2020-02-09 (×2): 4 [IU] via SUBCUTANEOUS
  Administered 2020-02-10: 7 [IU] via SUBCUTANEOUS
  Administered 2020-02-10 – 2020-02-11 (×7): 4 [IU] via SUBCUTANEOUS
  Administered 2020-02-11: 7 [IU] via SUBCUTANEOUS
  Administered 2020-02-11: 4 [IU] via SUBCUTANEOUS
  Administered 2020-02-11: 7 [IU] via SUBCUTANEOUS
  Administered 2020-02-12 (×6): 4 [IU] via SUBCUTANEOUS
  Administered 2020-02-13: 7 [IU] via SUBCUTANEOUS
  Administered 2020-02-13 – 2020-02-14 (×8): 4 [IU] via SUBCUTANEOUS
  Administered 2020-02-14: 3 [IU] via SUBCUTANEOUS
  Administered 2020-02-14: 4 [IU] via SUBCUTANEOUS
  Administered 2020-02-14: 7 [IU] via SUBCUTANEOUS
  Administered 2020-02-14 – 2020-02-15 (×2): 4 [IU] via SUBCUTANEOUS
  Administered 2020-02-15: 3 [IU] via SUBCUTANEOUS
  Administered 2020-02-15 – 2020-02-16 (×3): 4 [IU] via SUBCUTANEOUS
  Administered 2020-02-16 (×2): 3 [IU] via SUBCUTANEOUS
  Administered 2020-02-16: 4 [IU] via SUBCUTANEOUS
  Administered 2020-02-16: 3 [IU] via SUBCUTANEOUS
  Administered 2020-02-17: 4 [IU] via SUBCUTANEOUS
  Administered 2020-02-18: 3 [IU] via SUBCUTANEOUS
  Administered 2020-02-19: 7 [IU] via SUBCUTANEOUS
  Administered 2020-02-19: 4 [IU] via SUBCUTANEOUS
  Administered 2020-02-19: 7 [IU] via SUBCUTANEOUS
  Administered 2020-02-20: 4 [IU] via SUBCUTANEOUS
  Administered 2020-02-20: 3 [IU] via SUBCUTANEOUS
  Administered 2020-02-20: 4 [IU] via SUBCUTANEOUS
  Administered 2020-02-20: 3 [IU] via SUBCUTANEOUS
  Administered 2020-02-21: 4 [IU] via SUBCUTANEOUS
  Administered 2020-02-21: 3 [IU] via SUBCUTANEOUS
  Administered 2020-02-21: 4 [IU] via SUBCUTANEOUS
  Administered 2020-02-22 (×2): 3 [IU] via SUBCUTANEOUS
  Administered 2020-02-22: 4 [IU] via SUBCUTANEOUS
  Administered 2020-02-22: 3 [IU] via SUBCUTANEOUS
  Administered 2020-02-22: 4 [IU] via SUBCUTANEOUS
  Administered 2020-02-23 (×3): 3 [IU] via SUBCUTANEOUS
  Administered 2020-02-24: 4 [IU] via SUBCUTANEOUS
  Administered 2020-02-24 – 2020-02-25 (×2): 3 [IU] via SUBCUTANEOUS
  Administered 2020-02-25 – 2020-02-26 (×2): 4 [IU] via SUBCUTANEOUS
  Administered 2020-02-26: 3 [IU] via SUBCUTANEOUS
  Administered 2020-02-27: 1 [IU] via SUBCUTANEOUS
  Administered 2020-02-27 – 2020-02-28 (×3): 4 [IU] via SUBCUTANEOUS
  Administered 2020-02-28 – 2020-02-29 (×4): 3 [IU] via SUBCUTANEOUS
  Administered 2020-02-29: 4 [IU] via SUBCUTANEOUS
  Administered 2020-03-01: 3 [IU] via SUBCUTANEOUS
  Administered 2020-03-01: 4 [IU] via SUBCUTANEOUS
  Administered 2020-03-01 – 2020-03-06 (×9): 3 [IU] via SUBCUTANEOUS
  Administered 2020-03-06: 4 [IU] via SUBCUTANEOUS
  Administered 2020-03-06 – 2020-03-08 (×11): 3 [IU] via SUBCUTANEOUS
  Administered 2020-03-08 – 2020-03-09 (×2): 4 [IU] via SUBCUTANEOUS
  Administered 2020-03-09 – 2020-03-10 (×5): 3 [IU] via SUBCUTANEOUS
  Administered 2020-03-10: 4 [IU] via SUBCUTANEOUS

## 2020-01-30 MED ORDER — INSULIN ASPART 100 UNIT/ML ~~LOC~~ SOLN
4.0000 [IU] | SUBCUTANEOUS | Status: DC
  Administered 2020-01-30 – 2020-01-31 (×5): 4 [IU] via SUBCUTANEOUS

## 2020-01-30 MED ORDER — POLYETHYLENE GLYCOL 3350 17 G PO PACK: 17.0000 g | PACK | Freq: Every day | ORAL | Status: DC | PRN

## 2020-01-30 MED ORDER — FUROSEMIDE 10 MG/ML IJ SOLN
80.0000 mg | Freq: Once | INTRAMUSCULAR | Status: AC
  Administered 2020-01-30: 80 mg via INTRAVENOUS
  Filled 2020-01-30: qty 8

## 2020-01-30 MED ORDER — SODIUM CHLORIDE 0.45 % IV SOLN: INTRAVENOUS | Status: DC

## 2020-01-30 NOTE — Progress Notes (Signed)
Pt BIPAP order is PRN. Pt is unable to wear at this time d/t AMS. RT will continue to monitor.

## 2020-01-30 NOTE — Progress Notes (Addendum)
EVENT NOTE:  Received page from bedside RN regarding concern for progression of respiratory failure with incraesed supplemental O2 requirement from 3>6L.  Pt evaluated at bedside. He is exhibiting paroxsmal respirations and is tachypnic however is maintaining O2 sats >90% while in the room. PE with decreased breath sounds on the left.   Chart reviewed. CXR from earlier today appeared slightly improved from 6/15. Upon receiving handoff from my colleague, Dr. Marianna Payment, earlier today, he does note that pt was showing similar respiratory patterns.   Overall, given the decreased breath sounds on the left, I am concerned about some mucous plugging which may be causing some of the worsening in breathing. He does have IVF running and is net +3L from admission however EF from 01/27/20 shows EF of 60-65% with G1DD. Low suspicion for pneumothorax. Would still consider PE as he has not been able to receive prophylaxis due to [place] however would consider mucous plugging more likely.   Plan: D/C IVF. stat CXR. Further management pending those results.  Mitzi Hansen, MD 01/30/20 1815  Addendum #1: CXR showing increased pulm edema. Discussed with bedside RN who is noting that pt's O2 sats are decreasing into the upper 80s but remains hemodynamically stable.   Plan: 80mg  lasix IV x1. Continue to hold IVFWill continue to monitor. Renal function is declining however would consider continuing diuresis if he continues to decline. Takes 100mg  toresemide BID along with metolazone so likely has high diuresis threshold.   Mitzi Hansen, MD 01/30/20 6:58 PM

## 2020-01-30 NOTE — Progress Notes (Signed)
HD#5 Subjective:  Overnight Events: no overnight events. Continues to have difficult time controlling blood sugar. Patients most recent ABG does not show respiratory acidosis but does show some hypercarbia.   [name] was resting in bed and minimally responsive to physical and verbal stimuli. He is tachypnic and has increased work of breathing.   Objective:  Vital signs in last 24 hours: Vitals:   01/30/20 0730 01/30/20 0800 01/30/20 0900 01/30/20 1000  BP: (!) 168/94 (!) 152/92 (!) 178/92 (!) 155/79  Pulse: 95 92 85 82  Resp: (!) 29 (!) 30 (!) 34   Temp:  (!) 100.5 F (38.1 C)    TempSrc:  Axillary    SpO2: 94% 95% 97% 94%  Weight:      Height:       Supplemental O2: Nasal Cannula SpO2: 94 % O2 Flow Rate (L/min): 4 L/min   Physical Exam:  Physical Exam Constitutional:      Appearance: He is obese. He is ill-appearing and diaphoretic.  Cardiovascular:     Rate and Rhythm: Normal rate.     Pulses: Normal pulses.  Pulmonary:     Effort: Tachypnea and accessory muscle usage present.     Breath sounds: Rales present.  Abdominal:     General: There is no distension.     Tenderness: There is no abdominal tenderness.  Musculoskeletal:        General: No swelling or tenderness.  Skin:    General: Skin is warm.  Neurological:     Motor: Weakness present.     Comments: Minimally responsive to physical and verbal stimuli     Filed Weights   01/27/20 0428 01/28/20 0339 01/29/20 0438  Weight: (!) 143 kg (!) 139.4 kg (!) 137 kg     Intake/Output Summary (Last 24 hours) at 01/30/2020 1140 Last data filed at 01/30/2020 0800 Gross per 24 hour  Intake 5503.57 ml  Output 850 ml  Net 4653.57 ml   Net IO Since Admission: 1,043.35 mL [01/30/20 1140]  Pertinent Labs: CBC Latest Ref Rng & Units 01/30/2020 01/30/2020 01/29/2020  WBC 4.0 - 10.5 K/uL 15.0(H) - 14.7(H)  Hemoglobin 13.0 - 17.0 g/dL 11.6(L) 12.9(L) 12.6(L)  Hematocrit 39 - 52 % 41.4 38.0(L) 43.5    Platelets 150 - 400 K/uL 159 - 216    CMP Latest Ref Rng & Units 01/30/2020 01/30/2020 01/29/2020  Glucose 70 - 99 mg/dL 288(H) - 294(H)  BUN 8 - 23 mg/dL 94(H) - 85(H)  Creatinine 0.61 - 1.24 mg/dL 3.17(H) - 2.77(H)  Sodium 135 - 145 mmol/L 148(H) 150(H) 148(H)  Potassium 3.5 - 5.1 mmol/L 4.1 4.1 4.0  Chloride 98 - 111 mmol/L 108 - 108  CO2 22 - 32 mmol/L 29 - 30  Calcium 8.9 - 10.3 mg/dL 9.1 - 9.5  Total Protein 6.5 - 8.1 g/dL 7.6 - -  Total Bilirubin 0.3 - 1.2 mg/dL 0.5 - -  Alkaline Phos 38 - 126 U/L 105 - -  AST 15 - 41 U/L 55(H) - -  ALT 0 - 44 U/L 98(H) - -    Pending Labs: none  Imaging:  CT HEAD WO CONTRAST   IMPRESSION: Stable intraventricular hemorrhage. Small amount of subarachnoid hemorrhage on the left also stable. No new hemorrhage or infarct Mild ventricular enlargement is stable.   DG CHEST PORT 1 VIEW   IMPRESSION: Small left pleural effusion with bibasilar atelectasis. No airspace opacity evident. Stable cardiomegaly with pulmonary vascular congestion. No adenopathy evident. Postoperative changes  noted. Feeding tube tip below diaphragm.   Assessment/Plan:   Principal Problem:   ICH (intracerebral hemorrhage) (HCC) Active Problems:   Goals of care, counseling/discussion   Palliative care by specialist    Patient Summary: Patient is a 74 year old male with past medical history significant for prior intracranial hemorrhage, HFpEF, CKD stage IV, hypertension, diabetes mellitus who presented for evaluation of headache and was found to havebilateralintraventricular hemorrhage.  #Metabolic encephlopathy, multifactorial: Likely secondary to a combination of aspiration pneumonia, ICH, and metabolic derangements.   # Bilateral intraventricular hemorrhage Recent CT scan this morning shows stable ICH. He remains minimally responsive with increased work of breathing and saturating at 94% on 4L [place]. Patient was weaned off of cleviprex yesterday but has been  restarted this morning due to significant HTN > 140 SBP.  - Continue cleviprex and keep SBP <140. - Continue to monitor mental status closely.  - Appreciate neurologies assistance.   #Aspiraiton pneumonia: #SHOB: #Leukocytosis: On day 4 of AB therapy with Unasyn. Continues to have a leukocytosis of 15 with fevers. His lactic acid has been normal and his previous ABG do not show signs of hypercarbia or acidosis. It is difficult to determine if the patient is making significant progress. HE has not had worsening O2 requirements, but does appears to be using accessory muscle to breath. Blood culture show no grow a 1 day. - Continue to monitor BCx - Continue IV unasyn - Supplemental oxygen as needed.   # Hypertension Patient was transitioned off of the cleviprex drip this morning, but blood pressures continueed to rise and is now back on it. We will slowly try to switch him to PO medications per his NG tube.  #T2DM Patient's hyperglycemia is difficult to control due to ongoing illness/infection. He is receiving SSI resistant plus 4 units of aspart q 4 hour with tube feeds. He is also getting 40 units bid of levemir and his blood glucose is still elevated at 277. - I will increase his levemir to 50 units bid and re-evaluate tomorrow.  - Continue SSI resistant  - Continue q 4 hour insulin with tube feeds - q 4 hour CBG checks.  #Hypernatremia: Mildly improved Na today. We will continue the free water pushes q 4 hours and 1/2NS at 100 cc/hr and re-evaluate in the morning.   #AKI on CKD: Had a increase in Cr to 3.17 today.  - Will order a FeNa for tomorrow to assess pre-renal vs intra-renal azotemia.  #Hepatocellular injury: - Continue to monitor hepatic function daily.   Diet: Tube Feeds IVF: 1/2NS,100cc/hr VTE: SCDs Code: Full PT/OT recs: Pending TOC recs: pending   Dispo: Anticipated discharge pending further evaluation and management.   Marianna Payment, D.O. [facility],  PGY-1 Date 01/30/2020 Time 11:40 AM Pager: [phone] Please contact the on call pager after 5 pm and on weekends at [phone].

## 2020-01-30 NOTE — Progress Notes (Addendum)
1745:  Patient saturation drops to low 80s.  Oxygen increase from 3 L to 6 L nasal cannula.  Internal medicine paged and at bedside to assess patient.  Verbal order to stop fluids and order STAT chest x ray.    1850:  RN paged MD again regarding patient conditions.  Patient oxygen saturation in 87-88%.  MD review chest x ray.  New orders received.  RN to continue to monitor.

## 2020-01-30 NOTE — Progress Notes (Signed)
Physical Therapy Treatment Patient Details Name: [name] MRN: [id number] DOB: November 21, 1945 Today's Date: 01/30/2020    History of Present Illness Pt is a 74 y.o. male with PMH of HTN, PAF, SSS, CHF, tachycardia, Heart failure, cerebral artery occlusion with cerebral infarction, OSA, chronic obstructive lung disease, pulmonary edema, DM, type II, CVA, DJD, stage 3 chronic kidney disease, AKI, Gout, depression, blindness, coarse tremors, CAD, L mid foot amputation, amd pacemaker. Pt sent to ED from outpatient imaging when pt had syncope episode, urinated on self, and became confused. CT reveals stable hemorrhage layering in each occipital horn of the lateral ventricles, stable.    PT Comments    Focus of session was on stimulation and improving arousal level. Pt responding to noxious stimuli (sternal rub), tactile input and loud auditory stimuli ~50% of the time. Pt with no response to noxious stimuli to nailbed on R LE. Hopeful to progress mobility once pt is more alert and able to participate in sessions. Pt would continue to benefit from skilled physical therapy services at this time while admitted and after d/c to address the below listed limitations in order to improve overall safety and independence with functional mobility.  All VSS throughout with pt on 4L of O2.     Follow Up Recommendations  SNF;Supervision/Assistance - 24 hour     Equipment Recommendations  Other (comment) (defer to next venue of care)    Recommendations for Other Services       Precautions / Restrictions Precautions Precautions: Fall Restrictions Weight Bearing Restrictions: No    Mobility  Bed Mobility Overal bed mobility: Needs Assistance             General bed mobility comments: pt requires total A for repositioning in bed; focus of session was on arousal, stimulation and PROM to all extremities   Transfers                    Ambulation/Gait                 Stairs              Wheelchair Mobility    Modified Rankin (Stroke Patients Only)       Balance                                            Cognition Arousal/Alertness: Lethargic Behavior During Therapy: Flat affect Overall Cognitive Status: Difficult to assess                                 General Comments: Pt lethargic and needed noxious stimuli (sternal rub) to elicit response of vocalization with eyes closed.       Exercises Other Exercises Other Exercises: PROM to all extremities at all joints, on all planes of motion; Pt assisting very minimally, intermittently with L UE movement    General Comments        Pertinent Vitals/Pain Pain Assessment: Faces Faces Pain Scale: No hurt    Home Living                      Prior Function            PT Goals (current goals can now be found in the care plan section) Acute Rehab PT Goals PT  Goal Formulation: Patient unable to participate in goal setting Time For Goal Achievement: 02/11/20 Potential to Achieve Goals: Fair Progress towards PT goals: Not progressing toward goals - comment (limited due to arousal level)    Frequency    Min 3X/week      PT Plan Current plan remains appropriate    Co-evaluation              AM-PAC PT "6 Clicks" Mobility   Outcome Measure  Help needed turning from your back to your side while in a flat bed without using bedrails?: Total Help needed moving from lying on your back to sitting on the side of a flat bed without using bedrails?: Total Help needed moving to and from a bed to a chair (including a wheelchair)?: Total Help needed standing up from a chair using your arms (e.g., wheelchair or bedside chair)?: Total Help needed to walk in hospital room?: Total Help needed climbing 3-5 steps with a railing? : Total 6 Click Score: 6    End of Session Equipment Utilized During Treatment: Oxygen (4L of O2) Activity Tolerance: Patient  limited by lethargy Patient left: in bed;with call bell/phone within reach;with bed alarm set Nurse Communication: Mobility status;Need for lift equipment PT Visit Diagnosis: Other symptoms and signs involving the nervous system (S96.283)     Time: 6629-4765 PT Time Calculation (min) (ACUTE ONLY): 12 min  Charges:  $Therapeutic Exercise: 8-22 mins                     Anastasio Champion, DPT  Acute Rehabilitation Services Pager [phone] Office [place] 01/30/2020, 3:32 PM

## 2020-01-30 NOTE — Progress Notes (Signed)
Paged for temporary drop in oxygen saturation to 85.   On exam patient continues to have AMS. He appears to be sleeping.  Physical Exam Blood pressure 137/80, pulse 78, temperature (!) 100.7 F (38.2 C), temperature source Oral, resp. rate (!) 36, height 6\' 2"  (1.88 m), weight (!) 137 kg, SpO2 90 %.  General: chronically ill appearing Resp: tachypnic , rales at the bases.      RN bladder scanned patient and he had minimal urine in bladder   A/P: Paged to evaluate change in patient. Currently being treated for ICH and aspiration pneumonia. CT head showed ICH was stable today. In addition chest xray was more suggestive of vascular congestion. Patient O2 sat dropped to 85 when laying him flat for bladder scan likely correlates with extra volume. He has not responded well to diuresis. Will repeat ABG to evaluate for possible need for intubation. He is not candidate for BiPAP due to mental status.  Addendum:  Arterial Blood Gas result:  pO2 72; pCO2 68; pH 7.306;  HCO3 33.5, %O2 Sat 90 - plan to discuss with PCCM

## 2020-01-30 NOTE — Progress Notes (Signed)
Inpatient Diabetes Program Recommendations  AACE/ADA: New Consensus Statement on Inpatient Glycemic Control (2015)  Target Ranges:  Prepandial:   less than 140 mg/dL      Peak postprandial:   less than 180 mg/dL (1-2 hours)      Critically ill patients:  140 - 180 mg/dL   Lab Results  Component Value Date   GLUCAP 313 (H) 01/30/2020   HGBA1C 8.9 (H) 01/26/2020    Review of Glycemic Control Results for [name], [name] (MRN [id number]) as of 01/30/2020 07:40  Ref. Range 01/29/2020 11:19 01/29/2020 15:57 01/29/2020 19:52 01/30/2020 00:39 01/30/2020 03:43  Glucose-Capillary Latest Ref Range: 70 - 99 mg/dL 365 (H) 268 (H) 278 (H) 283 (H) 313 (H)   Diabetes history: DM2  Outpatient Diabetes medications: Semglee 100 units daily Victoza 1.8 mg  Current orders for Inpatient glycemic control: Lantus 70 units daily (received last night) Novolog 0-20 Q4H Osmolite 1.5 @70ml /hr  Inpatient Diabetes Program Recommendations:     Novolog 4 units Q4H tube feed coverage.  Stop if tube feeds held or discontinued.  Will continue to follow while inpatient.  Thank you, Reche Dixon, RN, BSN Diabetes Coordinator Inpatient Diabetes Program [phone] (team pager from 8a-5p)

## 2020-01-30 NOTE — Progress Notes (Signed)
SLP Cancellation Note  Patient Details Name: Jaedin Regina MRN: [id number] DOB: 12/08/1945   Cancelled treatment:       Reason Eval/Treat Not Completed: Medical issues which prohibited therapy. Per RN, pt remains lethargic; respiratory status tenuous. He is not appropriate for PO trials at this time. Will continue to follow.    Osie Bond., M.A. [place] Acute Rehabilitation Services Pager [phone] Office [phone]  01/30/2020, 11:18 AM

## 2020-01-30 NOTE — Progress Notes (Signed)
Palliative:  HPI: 74 y.o. male  with past medical history of stroke requiring trach/PEG previously, HFpEF, atrial fibrillation, severe aortic stenosis s/p TAVR, CKD stage IV, hypertension, OHS/OSA uses CPAP, diabetes, s/p left foot amputation admitted on 01/25/2020 with ongoing headache and CT head bilateral intraventricular hemorrhage. Hospitalization complicated by ongoing poor mental status, aspiration pneumonia, hypertension, and concern for airway protection.   I met again today with [name]. He appears with more labored breathing than even yesterday. He does open eyes to command and makes an attempt to speak but follows no further commands and even this is inconsistent. RN reports he does open eyes on occasion but not consistently. Still clearly struggling with secretions and poor protection of airway.   I called and spoke with [name] and explained that I am concerned as he appears worse to me today than yesterday. Worsening renal failure and infection. Discussed concern for overall brain health and decline with history of stroke, CT head with extensive white matter disease, and now with bilateral brain hemorrhage. I am very worried about his aspiration of secretions and explained that there is no fix to this problem and he will continue to decline. I express concern that he is likely nearing end of life and I fear that if he is intubated he will not improve as he has previously with his stroke. [name] understands and is very aware of his severe underlying illness even prior to admission. However, she would like to respect his previously stated wishes to "try everything" but if he did not show improvement to be able to at least interact with his family then he would not want to be prolonged on life sustaining interventions. At this time she requests full aggressive care. I reassure her that we will respect her wishes and her father's wishes at this time. I understand that this has been decided in the  context of previous trach/PEG placement and improvement to a good quality of life and living fairly independently.   All questions/concerns addressed. Emotional support provided. Updated Dr. Marianna Payment and RN.   Exam: Does open eyes to command. Does not squeeze hand. Audible secretions from bedside. Breathing more labored with accessory muscle usage. Abd soft. Generalized weakness.   Plan: - Family requests continued aggressive care, full code based on his stated wish to "try everything."   25 min  Vinie Sill, NP Palliative Medicine Team Pager [phone] (Please see [email or web address] for schedule) Team Phone [phone]    Greater than 50%  of this time was spent counseling and coordinating care related to the above assessment and plan

## 2020-01-30 NOTE — Progress Notes (Signed)
Occupational Therapy Treatment Patient Details Name: [name] MRN: [id number] DOB: January 30, 1946 Today's Date: 01/30/2020    History of present illness Pt is a 74 y.o. male with PMH of HTN, PAF, SSS, CHF, tachycardia, Heart failure, cerebral artery occlusion with cerebral infarction, OSA, chronic obstructive lung disease, pulmonary edema, DM, type II, CVA, DJD, stage 3 chronic kidney disease, AKI, Gout, depression, blindness, coarse tremors, CAD, L mid foot amputation, amd pacemaker. Pt sent to ED from outpatient imaging when pt had syncope episode, urinated on self, and became confused. CT reveals stable hemorrhage layering in each occipital horn of the lateral ventricles, stable.   OT comments  Pt seen for OT f/u with focus on maintaining alertness level for simple commands. Pt was able to open eyes x2 with verbal response. He was able to squeeze L hand on command. Attempted to have pt use hand squeeze as form of yes/no, but pt was not able to problem solve this at this time. Pt completed oral care with RUE and total A hand over hand motion with suction tooth brush. He continues to require total A +2 for positioning bed level. D/c recs remain appropriate at this time, will continue to follow.     SNF;Supervision/Assistance - 24 hour    Equipment Recommendations  None recommended by OT    Recommendations for Other Services      Precautions / Restrictions Precautions Precautions: Fall Restrictions Weight Bearing Restrictions: No       Mobility Bed Mobility Overal bed mobility: Needs Assistance             General bed mobility comments: pt requires total A for repositioning in bed; focus of session was on arousal, stimulation and PROM/AROM to all extremities  Transfers                      Balance                                           ADL either performed or assessed with clinical judgement   ADL Overall ADL's : Needs  assistance/impaired Eating/Feeding: NPO   Grooming: Total assistance Grooming Details (indicate cue type and reason): total A hand over hand to complete oral care                               General ADL Comments: otherwise remains total A +2 for ADLs     Vision Baseline Vision/History: Legally blind     Perception     Praxis      Cognition Arousal/Alertness: Lethargic Behavior During Therapy: Flat affect Overall Cognitive Status: Difficult to assess                                 General Comments: Pt lethargic and needed noxious stimuli (sternal rub) to elicit response of vocalization with eyes closed. was able to open eyes on command x2        Exercises Exercises: Other exercises Other Exercises Other Exercises: PROM to all extremities at all joints, on all planes of motion; Pt assisting very minimally, intermittently with L UE movement   Shoulder Instructions       General Comments      Pertinent Vitals/ Pain  Pain Assessment: Faces Faces Pain Scale: No hurt  Home Living                                          Prior Functioning/Environment              Frequency  Min 2X/week        Progress Toward Goals  OT Goals(current goals can now be found in the care plan section)  Progress towards OT goals: Progressing toward goals  Acute Rehab OT Goals OT Goal Formulation: Patient unable to participate in goal setting Time For Goal Achievement: 02/11/20 Potential to Achieve Goals: [place] Discharge plan remains appropriate    Co-evaluation    PT/OT/SLP Co-Evaluation/Treatment: Yes Reason for Co-Treatment: For patient/therapist safety;To address functional/ADL transfers;Necessary to address cognition/behavior during functional activity;Complexity of the patient's impairments (multi-system involvement)   OT goals addressed during session: ADL's and self-care;Strengthening/ROM      AM-PAC OT  "6 Clicks" Daily Activity     Outcome Measure   Help from another person eating meals?: Total Help from another person taking care of personal grooming?: Total Help from another person toileting, which includes using toliet, bedpan, or urinal?: Total Help from another person bathing (including washing, rinsing, drying)?: Total Help from another person to put on and taking off regular upper body clothing?: Total Help from another person to put on and taking off regular lower body clothing?: Total 6 Click Score: 6    End of Session Equipment Utilized During Treatment: Oxygen  OT Visit Diagnosis: Other symptoms and signs involving cognitive function;Other symptoms and signs involving the nervous system (R29.898);Low vision, both eyes (H54.2);Apraxia (R48.2);Muscle weakness (generalized) (M62.81)   Activity Tolerance Treatment limited secondary to medical complications (Comment);Other (comment) (arousal)   Patient Left in bed;with call bell/phone within reach;with bed alarm set   Nurse Communication Mobility status        Time: 8250-0370 OT Time Calculation (min): 11 min  Charges: OT General Charges $OT Visit: 1 Visit OT Treatments $Therapeutic Activity: 8-22 mins  Zenovia Jarred, MSOT, OTR/L [facility] Office Number: [phone] Pager: [phone]  Zenovia Jarred 01/30/2020, 5:02 PM

## 2020-01-30 NOTE — Progress Notes (Addendum)
STROKE TEAM PROGRESS NOTE   INTERVAL HISTORY No family at bedside. Per RN, pt may be slightly more responsive than yesterday but still hardly open eyes with stimulation and not following commands. Repeat CT stable, no hydrocephalus and no change of [name]. However, pt has increased WOB, tachypnea. Primary team is working on that. Still on cleviprex.    OBJECTIVE Vitals:   01/30/20 0730 01/30/20 0800 01/30/20 0900 01/30/20 1000  BP: (!) 168/94 (!) 152/92 (!) 178/92 (!) 155/79  Pulse: 95 92 85 82  Resp: (!) 29 (!) 30 (!) 34   Temp:  (!) 100.5 F (38.1 C)    TempSrc:  Axillary    SpO2: 94% 95% 97% 94%  Weight:      Height:       CBC:  Recent Labs  Lab 01/29/20 0748 01/29/20 0748 01/30/20 0010 01/30/20 0715  WBC 14.7*  --   --  15.0*  NEUTROABS 11.5*  --   --   --   HGB 12.6*   < > 12.9* 11.6*  HCT 43.5   < > 38.0* 41.4  MCV 82.4  --   --  84.3  PLT 216  --   --  159   < > = values in this interval not displayed.   Basic Metabolic Panel:  Recent Labs  Lab 01/29/20 0748 01/29/20 1152 01/29/20 1616 01/29/20 1616 01/30/20 0010 01/30/20 0715  NA 150*   < > 148*   < > 150* 148*  K 4.0   < > 4.0   < > 4.1 4.1  CL 109   < > 108  --   --  108  CO2 31   < > 30  --   --  29  GLUCOSE 348*   < > 294*  --   --  288*  BUN 76*   < > 85*  --   --  94*  CREATININE 2.41*   < > 2.77*  --   --  3.17*  CALCIUM 9.4   < > 9.5  --   --  9.1  MG 2.4  --  2.4  --   --   --   PHOS 2.0*  --  2.2*  --   --   --    < > = values in this interval not displayed.   Lipid Panel:     Component Value Date/Time   CHOL 103 01/27/2020 0317   CHOL 129 01/05/2017 1235   TRIG 68 01/27/2020 0317   HDL 31 (L) 01/27/2020 0317   HDL 36 (L) 01/05/2017 1235   CHOLHDL 3.3 01/27/2020 0317   VLDL 14 01/27/2020 0317   LDLCALC 58 01/27/2020 0317   LDLCALC 71 01/05/2017 1235   HgbA1c:  Lab Results  Component Value Date   HGBA1C 8.9 (H) 01/26/2020   Urine Drug Screen:     Component Value Date/Time    LABOPIA NONE DETECTED 10/01/2013 1915   COCAINSCRNUR NONE DETECTED 10/01/2013 1915   COCAINSCRNUR NEG 12/16/2008 2246   LABBENZ NONE DETECTED 10/01/2013 1915   LABBENZ NEG 12/16/2008 2246   AMPHETMU NONE DETECTED 10/01/2013 1915   THCU NONE DETECTED 10/01/2013 1915   LABBARB NONE DETECTED 10/01/2013 1915    Alcohol Level     Component Value Date/Time   ETH <11 10/01/2013 1504    IMAGING CT HEAD WO CONTRAST  Result Date: 01/30/2020 CLINICAL DATA:  Cerebral hemorrhage follow-up EXAM: CT HEAD WITHOUT CONTRAST TECHNIQUE: Contiguous axial images were obtained from  the base of the skull through the vertex without intravenous contrast. COMPARISON:  CT head 01/28/2020, 01/25/2020 FINDINGS: Brain: Layering blood in the occipital horns bilaterally unchanged. Small amount of subarachnoid hemorrhage is present on the left, unchanged from the prior study. This is likely circulation of the intraventricular hemorrhage. No new hemorrhage. Generalized atrophy. Mild ventricular enlargement is stable. Extensive white matter disease diffusely is stable. Negative for acute infarct or mass.  No new hemorrhage. Vascular: The negative for hyperdense vessel. Skull: Negative Sinuses/Orbits: . Small air-fluid mild mucosal edema paranasal level right sinuses sphenoid sinus. Bilateral cataract extraction.  No orbital mass or edema. Other: None IMPRESSION: Stable intraventricular hemorrhage. Small amount of subarachnoid hemorrhage on the left also stable. No new hemorrhage or infarct Mild ventricular enlargement is stable. Electronically Signed   By: Franchot Gallo M.D.   On: 01/30/2020 08:27   DG CHEST PORT 1 VIEW  Result Date: 01/30/2020 CLINICAL DATA:  Wheezing EXAM: PORTABLE CHEST 1 VIEW COMPARISON:  January 29, 2020. FINDINGS: There is stable cardiomegaly with pulmonary venous hypertension. Pacemaker leads are attached to the right atrium and right ventricle. 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 Feeding tube tip is below  the diaphragm. There is a small left pleural effusion with left base atelectasis. There is slight right base atelectasis. No consolidation evident. IMPRESSION: Small left pleural effusion with bibasilar atelectasis. No airspace opacity evident. Stable cardiomegaly with pulmonary vascular congestion. No adenopathy evident. Postoperative changes noted. Feeding tube tip below diaphragm. Electronically Signed   By: Lowella Grip III M.D.   On: 01/30/2020 09:58     PHYSICAL EXAM Temp:  [99.9 F (37.7 C)-100.6 F (38.1 C)] 100.5 F (38.1 C) (06/16 0800) Pulse Rate:  [51-101] 82 (06/16 1000) Resp:  [25-40] 34 (06/16 0900) BP: (113-178)/(53-110) 155/79 (06/16 1000) SpO2:  [87 %-98 %] 94 % (06/16 1000)  General - obese, well developed, obtunded, in mild respiratory distress.  Ophthalmologic - fundi not visualized due to noncooperation.  Cardiovascular - Regular rhythm and rate.  Neuro - obtunded, barely open eyes with painful stimuli, moaning to pain, nonverbal, not following commands. Bilateral eyes s/p surgery, no identified pupils bilaterally. Eye left gaze position, sluggish doll's eyes. Positive gag cough and corneal. No clear facial droop. Tongue protrusion not cooperative. Not blinking to visual threat with eyes opening (legally blind at baseline). With pain stimulation, purposeful against gravity movement BUEs, however, slight withdraw BLEs. Left anterior half foot amputation. DTR 1+ and no babinski on the right. Sensation, coordination and gait not tested.    ASSESSMENT/PLAN [name] is a 74 y.o. male with history of atrial fibrillation not on anticoagulation due to history of rectal bleeding (on ASA), cardiomyopathy, PPM, coronary disease, hypertension, legally blind, diabetes type 2, hypertension, hyperlipidemia and morbid obesity  presenting with nausea, vomiting, headache, recent fall, recent syncope, and AMS. He did not receive IV t-PA due to [place].  [name] - bilateral  lateral ventricle small [name], etiology unclear, could be traumatic due to fall vs. spontaneous  CT Head - 01/25/20 - There is hemorrhage in the dependent portion of each lateral ventricle of uncertain etiology.   CT head - 01/26/20 - Unchanged intraventricular blood layering within the occipital horns of the lateral ventricles.   CT head 01/27/20 - slight increase of [name], borderline increase in ventriculomegaly  CT head repeat 01/28/20 - unchanged  MRI head - not able to do due to incompatible PPM   CT repeat 6/16 stable [name]. Small L SAH stable. Mild  ventricular enlargement stable.   Carotid Doppler - unremarkable  2D Echo - EF 60-65%  [employer] Virus 2 - negative  LDL - 58  HgbA1c - 8.9  VTE prophylaxis - heparin IV  aspirin 81 mg daily prior to admission, now on No antithrombotic due to [name]  Therapy recommendations:  SNF  Disposition:  Pending  Palliative care met wth family - pt does not want long-term life support, ok to live in a facility, had trach/PEG in the past - plan ongoing aggressive care at this time.  Metabolic encephalopathy - multifactorial including [name], uremia, worsening CKD, hyperglycemia, respiratory distress  Pt sleepy drowsy out of proportion for brain pathology - however, it could be the case if pt had baseline cognitive impairment   Etiology unclear  Not on sedation  Elevated LFT - AST/ALT 52/119 ->33/74->123/126->55/98  Elevated ammonia - 42->36  CKD IV with elevated BUN 75->77->72->76->94; Cre 2.37->2.66->2.39->2.41->3.17  Hyperglycemia  Respiratory distress - PCO2 50  EEG continuous slowing, no seizure  Management per primary team  Aspiration PNA Leukocytosis  WBC 11.4->12.2->14.7->15.0  On Unasyn 6/14>>   TMax 100.6->100.4  Concern for respiratory decline  Hypertensive emergency  Home BP meds: amlodipine ; apresoline ; metoprolol ; Demadex ; Zaroxolyn   Current BP meds: amlodipine ; hydralazine ; Demadex; metoprolol ->  restart po meds with amlodipine and metoprolol . on Cleviprex . SBP goal < 160 mm Hg with stable [name]  . Long-term BP goal normotensive  Hyperlipidemia  Home Lipid lowering medication: Crestor 20 mg daily  LDL 58, goal < 70  Hold statin for now due to elevated LFT and [name]  Diabetes type II uncontrolled w/ Hyperglycemia  Home diabetic meds: insulin  Current diabetic meds: levimir 50 bid, Novolog 4u q4h (TF coverage)  HgbA1c 8.9, goal < 7.0  SSI  CBG monitoring  DB RN following  Close PCP follow-up for better DM control  PAF  Not on AC due to [place] in 2015 (see below)  Not AC candidate now due to [name]  On ASA PTA  Hx of ICH  10/2013 - admitted for AMS found to have left temporoparietal ICH and edema. Concerning for hemorrhagic conversion but not able to have MRI done. Also had SOB due to PE from mobile mass (most likely clot vs unlikely vegetation) found on pacer wire in RA extending into RV. Not an anticoagulation candidate for treatment due to [place]. Placed on empiric abs for possible endocarditis/RA vegetation on wire - completed course ceftriaxone on 2/26. On aspirin 81 mg orally every day prior to admission.  Now on aspirin 81 mg orally every day.   Dysphagia  Not able to pass swallow due to altered mental status  n.p.o.  Cortrak 6/14 and on TF @ 70  On FW 250 Q4  On D5 @ 100  Speech on board  Watch for fluid overload  Other Stroke Risk Factors  Advanced age  Former cigarette smoker - quit  ETOH use, advised to drink no more than 1 alcoholic beverage per day.  Obesity, Body mass index is 38.78 kg/m., recommend weight loss, diet and exercise as appropriate   Coronary artery disease  Obstructive sleep apnea, on CPAP at home  Congestive Heart Failure s/p pacer  Other Active Problems  Legally blind - b/l eye surgery  PPM for bradycardia  Hypernatremia 139->144->150->148  Hypophosphatemia 2.0  Hospital day # 5  No new recommendation from  neurology at this time. Neurology will follow peripherally. Please feel free to call  with questions.   Rosalin Hawking, MD PhD Stroke Neurology 01/30/2020 10:39 AM  Patient continues to be critically ill for the last 24 hours, has developed respiratory distress, continues to have hyperglycemia, fever, worsening Cre and BUN, and concerning for fluid overload. Pt has high risk of respiratory decline, high risk of intubation.   This patient is critically ill due to obtundation, hypertensive emergency, [name], AKI on CKD, hypernatremia and at significant risk of neurological worsening, death form increased ICH, stroke, renal failure, seizure, heart failure. This patient's care requires constant monitoring of vital signs, hemodynamics, respiratory and cardiac monitoring, review of multiple databases, neurological assessment, discussion with family, other specialists and medical decision making of high complexity. I spent 35 minutes of neurocritical care time in the care of this patient.    To contact Stroke Continuity provider, please refer to [email or web address] After hours, contact General Neurology

## 2020-01-31 ENCOUNTER — Inpatient Hospital Stay (HOSPITAL_COMMUNITY): Payer: Medicare (Managed Care)

## 2020-01-31 DIAGNOSIS — R0902 Hypoxemia: Secondary | ICD-10-CM

## 2020-01-31 DIAGNOSIS — J9621 Acute and chronic respiratory failure with hypoxia: Secondary | ICD-10-CM

## 2020-01-31 DIAGNOSIS — J9612 Chronic respiratory failure with hypercapnia: Secondary | ICD-10-CM

## 2020-01-31 DIAGNOSIS — J9622 Acute and chronic respiratory failure with hypercapnia: Secondary | ICD-10-CM

## 2020-01-31 DIAGNOSIS — J9611 Chronic respiratory failure with hypoxia: Secondary | ICD-10-CM

## 2020-01-31 LAB — POCT I-STAT 7, (LYTES, BLD GAS, ICA,H+H)
Acid-Base Excess: 5 mmol/L — ABNORMAL HIGH (ref 0.0–2.0)
Acid-Base Excess: 5 mmol/L — ABNORMAL HIGH (ref 0.0–2.0)
Acid-Base Excess: 6 mmol/L — ABNORMAL HIGH (ref 0.0–2.0)
Bicarbonate: 31.4 mmol/L — ABNORMAL HIGH (ref 20.0–28.0)
Bicarbonate: 33.2 mmol/L — ABNORMAL HIGH (ref 20.0–28.0)
Bicarbonate: 33.5 mmol/L — ABNORMAL HIGH (ref 20.0–28.0)
Calcium, Ion: 1.22 mmol/L (ref 1.15–1.40)
Calcium, Ion: 1.26 mmol/L (ref 1.15–1.40)
Calcium, Ion: 1.27 mmol/L (ref 1.15–1.40)
HCT: 32 % — ABNORMAL LOW (ref 39.0–52.0)
HCT: 37 % — ABNORMAL LOW (ref 39.0–52.0)
HCT: 38 % — ABNORMAL LOW (ref 39.0–52.0)
Hemoglobin: 10.9 g/dL — ABNORMAL LOW (ref 13.0–17.0)
Hemoglobin: 12.6 g/dL — ABNORMAL LOW (ref 13.0–17.0)
Hemoglobin: 12.9 g/dL — ABNORMAL LOW (ref 13.0–17.0)
O2 Saturation: 90 %
O2 Saturation: 96 %
O2 Saturation: 99 %
Patient temperature: 101
Patient temperature: 101
Patient temperature: 99.4
Potassium: 4.4 mmol/L (ref 3.5–5.1)
Potassium: 4.5 mmol/L (ref 3.5–5.1)
Potassium: 4.6 mmol/L (ref 3.5–5.1)
Sodium: 148 mmol/L — ABNORMAL HIGH (ref 135–145)
Sodium: 148 mmol/L — ABNORMAL HIGH (ref 135–145)
Sodium: 149 mmol/L — ABNORMAL HIGH (ref 135–145)
TCO2: 33 mmol/L — ABNORMAL HIGH (ref 22–32)
TCO2: 35 mmol/L — ABNORMAL HIGH (ref 22–32)
TCO2: 35 mmol/L — ABNORMAL HIGH (ref 22–32)
pCO2 arterial: 51.1 mmHg — ABNORMAL HIGH (ref 32.0–48.0)
pCO2 arterial: 67.3 mmHg (ref 32.0–48.0)
pCO2 arterial: 68.1 mmHg (ref 32.0–48.0)
pH, Arterial: 7.306 — ABNORMAL LOW (ref 7.350–7.450)
pH, Arterial: 7.307 — ABNORMAL LOW (ref 7.350–7.450)
pH, Arterial: 7.399 (ref 7.350–7.450)
pO2, Arterial: 100 mmHg (ref 83.0–108.0)
pO2, Arterial: 128 mmHg — ABNORMAL HIGH (ref 83.0–108.0)
pO2, Arterial: 72 mmHg — ABNORMAL LOW (ref 83.0–108.0)

## 2020-01-31 LAB — CBC
HCT: 37.1 % — ABNORMAL LOW (ref 39.0–52.0)
Hemoglobin: 10.3 g/dL — ABNORMAL LOW (ref 13.0–17.0)
MCH: 23.8 pg — ABNORMAL LOW (ref 26.0–34.0)
MCHC: 27.8 g/dL — ABNORMAL LOW (ref 30.0–36.0)
MCV: 85.7 fL (ref 80.0–100.0)
Platelets: 129 10*3/uL — ABNORMAL LOW (ref 150–400)
RBC: 4.33 MIL/uL (ref 4.22–5.81)
RDW: 16.6 % — ABNORMAL HIGH (ref 11.5–15.5)
WBC: 15 10*3/uL — ABNORMAL HIGH (ref 4.0–10.5)
nRBC: 0 % (ref 0.0–0.2)

## 2020-01-31 LAB — COMPREHENSIVE METABOLIC PANEL
ALT: 71 U/L — ABNORMAL HIGH (ref 0–44)
AST: 35 U/L (ref 15–41)
Albumin: 2.2 g/dL — ABNORMAL LOW (ref 3.5–5.0)
Alkaline Phosphatase: 86 U/L (ref 38–126)
Anion gap: 12 (ref 5–15)
BUN: 115 mg/dL — ABNORMAL HIGH (ref 8–23)
CO2: 29 mmol/L (ref 22–32)
Calcium: 8.8 mg/dL — ABNORMAL LOW (ref 8.9–10.3)
Chloride: 106 mmol/L (ref 98–111)
Creatinine, Ser: 3.87 mg/dL — ABNORMAL HIGH (ref 0.61–1.24)
GFR calc Af Amer: 17 mL/min — ABNORMAL LOW (ref >60)
GFR calc non Af Amer: 15 mL/min — ABNORMAL LOW (ref >60)
Glucose, Bld: 303 mg/dL — ABNORMAL HIGH (ref 70–99)
Potassium: 4.8 mmol/L (ref 3.5–5.1)
Sodium: 147 mmol/L — ABNORMAL HIGH (ref 135–145)
Total Bilirubin: 0.3 mg/dL (ref 0.3–1.2)
Total Protein: 6.9 g/dL (ref 6.5–8.1)

## 2020-01-31 LAB — TRIGLYCERIDES: Triglycerides: 97 mg/dL (ref <150)

## 2020-01-31 LAB — URINALYSIS, ROUTINE W REFLEX MICROSCOPIC
Bilirubin Urine: NEGATIVE
Glucose, UA: 50 mg/dL — AB
Hgb urine dipstick: NEGATIVE
Ketones, ur: NEGATIVE mg/dL
Leukocytes,Ua: NEGATIVE
Nitrite: NEGATIVE
Protein, ur: 100 mg/dL — AB
Specific Gravity, Urine: 1.016 (ref 1.005–1.030)
pH: 5 (ref 5.0–8.0)

## 2020-01-31 LAB — GLUCOSE, CAPILLARY
Glucose-Capillary: 354 mg/dL — ABNORMAL HIGH (ref 70–99)
Glucose-Capillary: 294 mg/dL — ABNORMAL HIGH (ref 70–99)
Glucose-Capillary: 273 mg/dL — ABNORMAL HIGH (ref 70–99)
Glucose-Capillary: 310 mg/dL — ABNORMAL HIGH (ref 70–99)
Glucose-Capillary: 290 mg/dL — ABNORMAL HIGH (ref 70–99)
Glucose-Capillary: 257 mg/dL — ABNORMAL HIGH (ref 70–99)
Glucose-Capillary: 266 mg/dL — ABNORMAL HIGH (ref 70–99)

## 2020-01-31 LAB — PHOSPHORUS: Phosphorus: 5.9 mg/dL — ABNORMAL HIGH (ref 2.5–4.6)

## 2020-01-31 LAB — SODIUM, URINE, RANDOM: Sodium, Ur: <10 mmol/L

## 2020-01-31 LAB — TSH: TSH: 1.184 u[IU]/mL (ref 0.350–4.500)

## 2020-01-31 LAB — CREATININE, URINE, RANDOM: Creatinine, Urine: 129.9 mg/dL

## 2020-01-31 LAB — AMMONIA: Ammonia: 44 umol/L — ABNORMAL HIGH (ref 9–35)

## 2020-01-31 MED ORDER — DOCUSATE SODIUM 50 MG/5ML PO LIQD
100.0000 mg | Freq: Two times a day (BID) | ORAL | Status: DC
  Filled 2020-01-31: qty 10

## 2020-01-31 MED ORDER — PROPOFOL 1000 MG/100ML IV EMUL
0.0000 ug/kg/min | INTRAVENOUS | Status: DC
  Administered 2020-01-31: 10 ug/kg/min via INTRAVENOUS
  Administered 2020-01-31: 5 ug/kg/min via INTRAVENOUS
  Administered 2020-02-01: 10 ug/kg/min via INTRAVENOUS
  Filled 2020-01-31 (×2): qty 100

## 2020-01-31 MED ORDER — FENTANYL CITRATE (PF) 100 MCG/2ML IJ SOLN
INTRAMUSCULAR | Status: AC
  Filled 2020-01-31: qty 4

## 2020-01-31 MED ORDER — POLYETHYLENE GLYCOL 3350 17 G PO PACK
17.0000 g | PACK | Freq: Every day | ORAL | Status: DC
  Filled 2020-01-31: qty 1

## 2020-01-31 MED ORDER — METRONIDAZOLE IN NACL 5-0.79 MG/ML-% IV SOLN
500.0000 mg | Freq: Three times a day (TID) | INTRAVENOUS | Status: DC
  Administered 2020-01-31 – 2020-02-01 (×4): 500 mg via INTRAVENOUS
  Filled 2020-01-31 (×4): qty 100

## 2020-01-31 MED ORDER — CHLORHEXIDINE GLUCONATE 0.12% ORAL RINSE (MEDLINE KIT)
15.0000 mL | Freq: Two times a day (BID) | OROMUCOSAL | Status: DC
  Administered 2020-01-31: 15 mL via OROMUCOSAL

## 2020-01-31 MED ORDER — INSULIN DETEMIR 100 UNIT/ML ~~LOC~~ SOLN
10.0000 [IU] | Freq: Once | SUBCUTANEOUS | Status: AC
  Administered 2020-01-31: 10 [IU] via SUBCUTANEOUS
  Filled 2020-01-31: qty 0.1

## 2020-01-31 MED ORDER — ROCURONIUM BROMIDE 10 MG/ML (PF) SYRINGE
100.0000 mg | PREFILLED_SYRINGE | Freq: Once | INTRAVENOUS | Status: AC
  Administered 2020-01-31: 100 mg via INTRAVENOUS

## 2020-01-31 MED ORDER — INSULIN DETEMIR 100 UNIT/ML ~~LOC~~ SOLN
60.0000 [IU] | Freq: Two times a day (BID) | SUBCUTANEOUS | Status: DC
  Administered 2020-01-31 – 2020-02-23 (×45): 60 [IU] via SUBCUTANEOUS
  Filled 2020-01-31 (×53): qty 0.6

## 2020-01-31 MED ORDER — SODIUM CHLORIDE 0.9 % IV SOLN
2.0000 g | INTRAVENOUS | Status: DC
  Administered 2020-01-31 – 2020-02-03 (×4): 2 g via INTRAVENOUS
  Filled 2020-01-31 (×4): qty 2

## 2020-01-31 MED ORDER — INSULIN ASPART 100 UNIT/ML ~~LOC~~ SOLN
6.0000 [IU] | SUBCUTANEOUS | Status: DC
  Administered 2020-01-31 – 2020-02-03 (×18): 6 [IU] via SUBCUTANEOUS

## 2020-01-31 MED ORDER — ETOMIDATE 2 MG/ML IV SOLN
20.0000 mg | Freq: Once | INTRAVENOUS | Status: AC
  Administered 2020-01-31: 20 mg via INTRAVENOUS

## 2020-01-31 MED ORDER — VANCOMYCIN HCL 1750 MG/350ML IV SOLN: 1750.0000 mg | INTRAVENOUS | Status: DC

## 2020-01-31 MED ORDER — FAMOTIDINE IN NACL 20-0.9 MG/50ML-% IV SOLN
20.0000 mg | INTRAVENOUS | Status: DC
  Administered 2020-01-31 – 2020-02-01 (×2): 20 mg via INTRAVENOUS
  Filled 2020-01-31 (×3): qty 50

## 2020-01-31 MED ORDER — CHLORHEXIDINE GLUCONATE 0.12% ORAL RINSE (MEDLINE KIT)
15.0000 mL | Freq: Two times a day (BID) | OROMUCOSAL | Status: DC
  Administered 2020-01-31 – 2020-02-07 (×15): 15 mL via OROMUCOSAL

## 2020-01-31 MED ORDER — VANCOMYCIN HCL 2000 MG/400ML IV SOLN
2000.0000 mg | Freq: Once | INTRAVENOUS | Status: AC
  Administered 2020-01-31: 2000 mg via INTRAVENOUS
  Filled 2020-01-31: qty 400

## 2020-01-31 MED ORDER — IPRATROPIUM-ALBUTEROL 0.5-2.5 (3) MG/3ML IN SOLN
3.0000 mL | Freq: Four times a day (QID) | RESPIRATORY_TRACT | Status: DC
  Administered 2020-01-31 – 2020-02-07 (×32): 3 mL via RESPIRATORY_TRACT
  Filled 2020-01-31 (×33): qty 3

## 2020-01-31 MED ORDER — PRO-STAT SUGAR FREE PO LIQD
60.0000 mL | Freq: Two times a day (BID) | ORAL | Status: DC
  Administered 2020-01-31 – 2020-02-28 (×51): 60 mL
  Filled 2020-01-31 (×50): qty 60

## 2020-01-31 MED ORDER — FUROSEMIDE 10 MG/ML IJ SOLN
40.0000 mg | Freq: Two times a day (BID) | INTRAMUSCULAR | Status: DC
  Administered 2020-01-31 – 2020-02-01 (×2): 40 mg via INTRAVENOUS
  Filled 2020-01-31 (×2): qty 4

## 2020-01-31 MED ORDER — HYDRALAZINE HCL 50 MG PO TABS
50.0000 mg | ORAL_TABLET | Freq: Three times a day (TID) | ORAL | Status: DC
  Administered 2020-01-31 – 2020-02-14 (×40): 50 mg
  Filled 2020-01-31 (×41): qty 1

## 2020-01-31 MED ORDER — ORAL CARE MOUTH RINSE
15.0000 mL | OROMUCOSAL | Status: DC
  Administered 2020-01-31 (×2): 15 mL via OROMUCOSAL

## 2020-01-31 MED ORDER — FENTANYL CITRATE (PF) 100 MCG/2ML IJ SOLN: 25.0000 ug | INTRAMUSCULAR | Status: DC | PRN

## 2020-01-31 MED ORDER — ORAL CARE MOUTH RINSE
15.0000 mL | OROMUCOSAL | Status: DC
  Administered 2020-01-31 – 2020-02-07 (×72): 15 mL via OROMUCOSAL

## 2020-01-31 MED ORDER — FENTANYL CITRATE (PF) 100 MCG/2ML IJ SOLN
25.0000 ug | INTRAMUSCULAR | Status: DC | PRN
  Administered 2020-01-31 – 2020-02-06 (×27): 100 ug via INTRAVENOUS
  Filled 2020-01-31 (×26): qty 2

## 2020-01-31 MED ORDER — POLYETHYLENE GLYCOL 3350 17 G PO PACK
17.0000 g | PACK | Freq: Every day | ORAL | Status: DC
  Administered 2020-01-31 – 2020-03-10 (×20): 17 g
  Filled 2020-01-31 (×28): qty 1

## 2020-01-31 MED ORDER — FREE WATER
200.0000 mL | Status: DC
  Administered 2020-01-31 – 2020-02-02 (×15): 200 mL

## 2020-01-31 MED ORDER — VITAL HIGH PROTEIN PO LIQD
1000.0000 mL | ORAL | Status: DC
  Administered 2020-01-31 – 2020-02-07 (×9): 1000 mL

## 2020-01-31 MED ORDER — PROPOFOL 1000 MG/100ML IV EMUL
INTRAVENOUS | Status: AC
  Filled 2020-01-31: qty 100

## 2020-01-31 MED ORDER — DOCUSATE SODIUM 50 MG/5ML PO LIQD
100.0000 mg | Freq: Two times a day (BID) | ORAL | Status: DC
  Administered 2020-01-31 – 2020-03-10 (×55): 100 mg
  Filled 2020-01-31 (×58): qty 10

## 2020-01-31 NOTE — Progress Notes (Signed)
RT and 2 RNs transported pt on vent from 4N18 to CT and back without complication. Pt stable throughout transport. RT will continue to monitor.

## 2020-01-31 NOTE — Procedures (Signed)
Bronchoscopy Procedure Note Ebony Rickel [id number] [birth year]  Procedure: Bronchoscopy Indications: Obtain specimens for culture and/or other diagnostic studies and Remove secretions  Procedure Details Consent: Unable to obtain consent because of altered level of consciousness. Time Out: Verified patient identification, verified procedure, site/side was marked, verified correct patient position, special equipment/implants available, medications/allergies/relevent history reviewed, required imaging and test results available.  Performed  Patient was satting well, however Peak pressures in high 30s, not receiving full volumes.   CXR done, showed atelectasis and likely mucus plug in RML.  In preparation for procedure, patient was given 100% FiO2 and bronchoscope lubricated. Sedation: Etomidate (had received for intubation immediately prior.   Airway entered and the following bronchi were examined: RUL, RML, RLL, LUL and LLL.     This mucous at distal portion of ET tube, suctioned away with some difficulty due to thickness of secretions.  Airways on R side inflammed, mild/moderately edematous.  Thick white/yellow mucous in medial and lateral RML.  Bulk of mucous removed, followed by lavage with good return.   Some scant secretions and mucous in RLL subsegments as well, though much less copious.   RUL without secretions.  L side clear.   Procedures performed: BAL RML Bronchoscope removed.    Evaluation Hemodynamic Status: BP stable throughout; O2 sats: stable throughout Patient's Current Condition: stable Specimens:  Sent clear lavage fluid, white thick mucous particles within.   Complications: No apparent complications Patient did tolerate procedure well.   Collier Bullock 01/31/2020

## 2020-01-31 NOTE — Progress Notes (Signed)
IM MD paged with ABG results.

## 2020-01-31 NOTE — Progress Notes (Addendum)
[facility] Progress Note Patient Name: Sidi Dzikowski DOB: Nov 01, 1945 MRN: [id number]   Date of Service  01/31/2020  HPI/Events of Note  100%/PRVC 16/TV 560/P 5 = 7.307/67.3/100  eICU Interventions  Plan: 1. Increase PRVC rate to 19.  2. Repeat ABG at 6:00 AM.     Intervention Category Major Interventions: Respiratory failure - evaluation and management;Acid-Base disturbance - evaluation and management  Shatarra Wehling [place] 01/31/2020, 4:05 AM

## 2020-01-31 NOTE — Progress Notes (Signed)
NAME:  [name], MRN:  [id number], DOB:  12-08-1945, LOS: 6 ADMISSION DATE:  01/25/2020, CONSULTATION DATE:  01/31/2020 REFERRING MD:  Graciella Freer, CHIEF COMPLAINT:  Worsening AMS/ hypoxia  Brief History   36 yoM admitted 6/11 with small bilateral [name] requiring intermittent cleviprex for strict BP control, with hospitalization complicated by likely aspiration pneumonia, AKI on CKD, and worsening mental status now and desaturation episodes with increased O2 needs.  Mental status prevents BiPAP.  PCCM consulted for respiratory failure and further management.   History of present illness   HPI obtained from medical chart review as patient is unresponsive.   74 year old male with prior extensive medical history significant for blindness, prior ICH with previous trach/ PEG with some baseline aphasia, HFpEF, AS s/p TAVR 04/2019, afib not on AC due to GI bleeding and ICH, CAD, OSA, CKD stage IV, HTN, morbid obesity, and poorly controlled DMT2 with neuropathy who presented 6/11 with 3 day history of bifrontal headache, recent falls 2-3 weeks prior, increasing confusion/ syncope and nausea/ vomiting.  Found on workup to have small bilateral intraventricular hemorrhage of unclear etiology, suspected to be traumatic given fall history vs spontaneous.   MRI unable to be obtained given pacemaker.  Admitted to IMTS with neurology consulting.  Required intermittent cleviprex gtt for strict BP control. Hospitalization complicated by suspected aspiration pneumonia on 6/13 and started empirically on antibiotics, and worsening AKI on CKD, and progressive decline in mental status.  Serial imaging mostly stable.  Palliative care consulted to help with goals of care and Overnight, 6/16, he has had worsening mental status now, increased O2 needs, and desaturations. ABG obtained showed 7.306/ 68/ 72/ 33.5.   PCCM consulted for further management of respiratory failure.   Past Medical History  Blindness, OSA, prior ICH with  previous trach/ PEG, HFpEF, afib, AS s/p TAVR 04/2019, CAD, CKD stage IV, HTN, DMT2 poorly controlled with neuropathy, morbid obesity, HLD, GERD, bradycardia/ LBBB with pacemaker,   Significant Hospital Events   6/11 admitted IMTS to ICU, intermittently requiring cleviprex 6/13 concern for aspiration, abx started 6/15 requiring cleviprex gtt 6/17 worsening mental status/ hypoxia- intubated  Consults:  Neurology 6/11 Palliative care 6/15 PCCM 6/17  Procedures:  6/14 right nare cortrak >> 6/17 ETT >> 6/17 bronchoscopy-> RML mucous plug   Significant Diagnostic Tests:  6/11 CTH >> There is hemorrhage in the dependent portion of each lateral ventricle of uncertain etiology. No brain parenchymal hemorrhage elsewhere noted. No mass. No extra-axial fluid.  There is mild atrophy with extensive periventricular small vessel disease. No acute appearing infarct is evident.  There are multiple foci of arterial vascular calcification. There is mucosal thickening in several ethmoid air cells.  6/11 RUQ limited US >> Gallbladder sludge and gallstones without complicating factors.  Right renal cysts stable from prior CT examination.  6/12 CTH >> unchanged [name] layering within the occipital horns of the lateral ventricles  6/13 CTH >> 1. Increased but still small volume intraventricular hemorrhage layering in the occipital horns of the lateral ventricles. 2. Borderline increase in ventriculomegaly, recommend follow-up.  6/13 TTE >> LVEF 60-65%, mod LVH, no wall motion abnormality, [id number], RV systolic function normal, mild LA dilation, post TAVR, mild dilation of aortic root  6/14 CTH >>  Stable hemorrhage layering in each occipital horn of the lateral ventricles, stable. No hemorrhage appreciable. There is atrophy with extensive supratentorial small vessel disease, stable. No acute infarct evident. There is extensive arterial vascular calcification at multiple sites. There is mucosal  thickening in  multiple ethmoid air cells.  6/16 CTH >> Stable intraventricular hemorrhage. Small amount of subarachnoid hemorrhage on the left also stable. No new hemorrhage or infarct Mild ventricular enlargement is stable  6/17 CTH >>  Stable exam with small amount of bilateral intraventricular hemorrhage and subarachnoid overlying L parietal lobe.  No new hemorrhage   Micro Data:  6/11 SARS 2 >> neg 6/15 MRSA PCR >> neg 6/15 BCx2 >> ngtd >> 6/18 BAL  >>  Antimicrobials:  6/13 azithro >> 6/14 6/13 ceftriaxone >> 6/14 6/14 unasyn >> 6/16 6/17 vanc >> 6/17 cefepime >> 6/17 flagyl >>  Interim history/subjective:  Improved ABG this morning:  7.399 / 51.1/ 128/ 33/ 6/ 31.4   Objective   Blood pressure 119/67, pulse 69, temperature 98.4 F (36.9 C), temperature source Axillary, resp. rate 19, height _0  (1.753 m), weight (!) 137 kg, SpO2 100 %.    Vent Mode: PRVC FiO2 (%):  [100 %] 100 % Set Rate:  [16 bmp-19 bmp] 19 bmp Vt Set:  [560 mL] 560 mL PEEP:  [5 cmH20] 5 cmH20 Plateau Pressure:  [22 cmH20-23 cmH20] 22 cmH20   Intake/Output Summary (Last 24 hours) at 01/31/2020 0835 Last data filed at 01/31/2020 0600 Gross per 24 hour  Intake 3890.67 ml  Output 610 ml  Net 3280.67 ml   Filed Weights   01/27/20 0428 01/28/20 0339 01/29/20 0438  Weight: (!) 143 kg (!) 139.4 kg (!) 137 kg   Examination: General:  Obese chronically ill older adult M, intubated sedate NAD  HEENT:NCAT  ETT secure. Poor dentition. Anicteric sclera  Neuro:  Does not follow commands.  CV: RRR s1s2 no rgm. Cap refill < 3 seconds. 1+ radial pulses  PULM:  Scattered bilateral rhonchi. Symmetrical chest expansion, mechanically ventalted  VU:YEBX obese hypoactive BS  Extremities: L pedal digit amputations. No obvious large joint deformity. Symmetrical appearing muscle bulk  Skin: c/d/w without rash   Resolved Hospital Problem list    Assessment & Plan:   Acute encephalopathy -likely multifactorial in setting  of hypoxia/hypercarpia,  uremia, infection. -CT H stable. Ammonia mildly elevated, doubt HE  P -delirium precautions  -normalization of metabolic abnormalities as able  Acute hypoxic / hypercarbic respiratory failure in the setting of aspiration pneumonia, mucous plugging, pulmonary edema, layering pleural effusions, and worsening encephalopathy -morbidly obese, wonder if degree of baseline hypercarbia/OHS? P:  -Continue full MV  -PRN CXR, PRN ABG -VAP, PAD  Sepsis  - likely aspiration pneumonia  P:  -Follow up cx data -continue broad abx, narrow as able   [name] - small, bilateral, suspected etiology falls vs spontaneous  P:  -Trend neuro exams -Will obtain CTH to rule out any intracranial changes/ extension of [name] -Per Neurology  -SBP goal < 160 -Check TSH and repeat ammonia for completeness   HTN Acute on chronic diastolic HFF P:  - SBP goal < 160, wean cleviprex as able  -Cont  hydralazine, amlodipine, and lopressor  AKI on CKD stage IV Uremia  Hypernatremia -initial FWD 3.7 L P:  -cont FWF, trending Na -Trend renal indices, I/O -Renal US pending  -FEUrea  DM2, poorly controlled P:  -Escalating Insulin requirement   Morbid obesity with BMI 38 -suspect possible degree of OHS contributing to hypercarbia  Goals of Care: -appreciate Palliative Care assistance in establishing [place]. It seems that at last engagement, family wished for full scope aggressive care.   Best practice:  Diet: TF at goal  Pain/Anxiety/Delirium protocol (if indicated): RASS  goal 0, prn propofol / fentanyl prn if needed VAP protocol (if indicated): yes DVT prophylaxis: heparin SQ GI prophylaxis: PPI Glucose control: as above Mobility: BR Code Status: Full. Family Communication:  Updated 6/17 early AM.  Disposition: ICU  Labs   CBC: Recent Labs  Lab 01/26/20 0029 01/26/20 0029 01/27/20 0317 01/27/20 0317 01/28/20 0762 01/28/20 2633 01/29/20 0748 01/29/20 0748 01/30/20 0010  01/30/20 0715 01/31/20 0053 01/31/20 0353 01/31/20 0803  WBC 11.4*  --  12.2*  --  14.2*  --  14.7*  --   --  15.0*  --   --   --   NEUTROABS  --   --   --   --   --   --  11.5*  --   --   --   --   --   --   HGB 12.1*   < > 11.2*   < > 12.0*   < > 12.6*   < > 12.9* 11.6* 12.9* 12.6* 10.9*  HCT 40.2   < > 38.3*   < > 41.2   < > 43.5   < > 38.0* 41.4 38.0* 37.0* 32.0*  MCV 79.3*  --  80.3  --  81.7  --  82.4  --   --  84.3  --   --   --   PLT 198  --  192  --  201  --  216  --   --  159  --   --   --    < > = values in this interval not displayed.    Basic Metabolic Panel: Recent Labs  Lab 01/28/20 0928 01/28/20 0928 01/28/20 1420 01/28/20 1854 01/29/20 0748 01/29/20 0748 01/29/20 1152 01/29/20 1152 01/29/20 1616 01/29/20 1616 01/30/20 0010 01/30/20 0715 01/31/20 0053 01/31/20 0353 01/31/20 0803  NA 144   < >  --   --  150*   < > 149*   < > 148*   < > 150* 148* 148* 149* 148*  K 3.1*   < >  --   --  4.0   < > 4.1   < > 4.0   < > 4.1 4.1 4.5 4.6 4.4  CL 101  --   --   --  109  --  107  --  108  --   --  108  --   --   --   CO2 31  --   --   --  31  --  30  --  30  --   --  29  --   --   --   GLUCOSE 217*  --   --   --  348*  --  391*  --  294*  --   --  288*  --   --   --   BUN 72*  --   --   --  76*  --  80*  --  85*  --   --  94*  --   --   --   CREATININE 2.39*  --   --   --  2.41*  --  2.71*  --  2.77*  --   --  3.17*  --   --   --   CALCIUM 9.1  --   --   --  9.4  --  9.3  --  9.5  --   --  9.1  --   --   --  MG  --   --  2.1 2.1 2.4  --   --   --  2.4  --   --   --   --   --   --   PHOS  --   --  3.4 3.0 2.0*  --   --   --  2.2*  --   --   --   --   --   --    < > = values in this interval not displayed.   GFR: Estimated Creatinine Clearance: 29.4 mL/min (A) (by C-G formula based on SCr of 3.17 mg/dL (H)). Recent Labs  Lab 01/27/20 0317 01/28/20 0928 01/29/20 0748 01/29/20 1616 01/29/20 1852 01/30/20 0715  WBC 12.2* 14.2* 14.7*  --   --  15.0*  LATICACIDVEN   --   --   --  1.8 1.7  --     Liver Function Tests: Recent Labs  Lab 01/27/20 2002 01/28/20 0928 01/29/20 0748 01/29/20 1152 01/30/20 0715  AST 31 33 123* 99* 55*  ALT 83* 74* 126* 120* 98*  ALKPHOS 121 116 120 117 105  BILITOT 0.6 0.5 0.5 0.6 0.5  PROT 8.0 8.4* 8.1 7.8 7.6  ALBUMIN 3.1* 3.0* 2.9* 2.9* 2.8*   No results for input(s): LIPASE, AMYLASE in the last 168 hours. Recent Labs  Lab 01/25/20 1356 01/27/20 2002 01/28/20 0928 01/31/20 0706  AMMONIA 42* 23 36* 44*    ABG    Component Value Date/Time   PHART 7.399 01/31/2020 0803   PCO2ART 51.1 (H) 01/31/2020 0803   PO2ART 128 (H) 01/31/2020 0803   HCO3 31.4 (H) 01/31/2020 0803   TCO2 33 (H) 01/31/2020 0803   ACIDBASEDEF 1.1 10/02/2013 0353   O2SAT 99.0 01/31/2020 0803     Coagulation Profile: No results for input(s): INR, PROTIME in the last 168 hours.  Cardiac Enzymes: No results for input(s): CKTOTAL, CKMB, CKMBINDEX, TROPONINI in the last 168 hours.  HbA1C: Hgb A1c MFr Bld  Date/Time Value Ref Range Status  01/26/2020 12:29 AM 8.9 (H) 4.8 - 5.6 % Final    Comment:    (NOTE) Pre diabetes:          5.7%-6.4%  Diabetes:              >6.4%  Glycemic control for   <7.0% adults with diabetes   05/07/2019 09:32 AM 8.0 (H) 4.8 - 5.6 % Final    Comment:    (NOTE) Pre diabetes:          5.7%-6.4% Diabetes:              >6.4% Glycemic control for   <7.0% adults with diabetes     CBG: Recent Labs  Lab 01/30/20 1916 01/30/20 2319 01/31/20 0149 01/31/20 0311 01/31/20 0738  GLUCAP 324* 351* 354* 294* 273*     CRITICAL CARE Performed by: Cristal Generous   Total critical care time: 45 minutes  Critical care time was exclusive of separately billable procedures and treating other patients. Critical care was necessary to treat or prevent imminent or life-threatening deterioration.  Critical care was time spent personally by me on the following activities: development of treatment plan with  patient and/or surrogate as well as nursing, discussions with consultants, evaluation of patient's response to treatment, examination of patient, obtaining history from patient or surrogate, ordering and performing treatments and interventions, ordering and review of laboratory studies, ordering and review of radiographic studies, pulse oximetry and re-evaluation of patient's condition.  Eliseo Gum MSN, AGACNP-BC [place] [phone] If no answer, [phone] 01/31/2020, 8:36 AM

## 2020-01-31 NOTE — Progress Notes (Signed)
Pharmacy Antibiotic Note  [name] is a 74 y.o. male admitted on 01/25/2020 with pneumonia.  Pharmacy has been consulted for Vancomycin/Cefepime dosing. Pt with worsening tonight requiring intubation. WBC elevated. Noted renal dysfunction-normalized CrCl is ~22.   Plan: Vancomycin 2000 mg IV x 1, then 1750 mg IV q48h Cefepime 2g IV q24h Trend WBC, temp, renal function  F/U infectious work-up Drug levels as indicated   Height: 6\' 2"  (188 cm) Weight: (!) 137 kg (302 lb 0.5 oz) IBW/kg (Calculated) : 82.2  Temp (24hrs), Avg:100.4 F (38 C), Min:99.4 F (37.4 C), Max:101 F (38.3 C)  Recent Labs  Lab 01/26/20 0029 01/26/20 0029 01/27/20 0317 01/27/20 0317 01/28/20 0928 01/29/20 0748 01/29/20 1152 01/29/20 1616 01/29/20 1852 01/30/20 0715  WBC 11.4*  --  12.2*  --  14.2* 14.7*  --   --   --  15.0*  CREATININE 2.37*   < > 2.66*   < > 2.39* 2.41* 2.71* 2.77*  --  3.17*  LATICACIDVEN  --   --   --   --   --   --   --  1.8 1.7  --    < > = values in this interval not displayed.    Estimated Creatinine Clearance: 31.5 mL/min (A) (by C-G formula based on SCr of 3.17 mg/dL (H)).    No Known Allergies  Narda Bonds, PharmD, BCPS Clinical Pharmacist Phone: [phone]

## 2020-01-31 NOTE — Progress Notes (Signed)
Paged IM MD on call due to patient's respiratory status. Dr. Court Joy came to bedside to evaluate patient. Placed order for ABG and notified respiratory therapy. Will continue to monitor patient.

## 2020-01-31 NOTE — Procedures (Addendum)
Intubation Procedure Note Nadir Vasques [id number] 04-10-1946  Procedure: Intubation Indications: Respiratory insufficiency  Procedure Details Consent: Unable to obtain consent because of emergent medical necessity. Time Out: Verified patient identification, verified procedure, site/side was marked, verified correct patient position, special equipment/implants available, medications/allergies/relevent history reviewed, required imaging and test results available.  Performed  Maximum sterile technique was used including cap, gloves, hand hygiene and mask.  4  Mac 4 glidescope used.  8-0 ET tube.   Given 20 etomidate, 100roc.   Cords identified easily.  Witnessed ET tube placed through cords.  Patient was difficult to bag, leaking air, suspected ET tube not in place, Returned glidescope to visualize tube, appeared to be in esophagus. Patient was only very gently bagged about 5 -10 times with low volume prior to this.   ET tube removed.   NEW tube placed, visualized passage through the cords, was easy to bag, color change on colorimetry, placement confirmed on CXR.   Evaluation Hemodynamic Status: BP stable throughout; O2 sats: stable throughout Patient's Current Condition: stable Complications:  Patient did tolerate procedure well. Chest X-ray ordered to verify placement.  CXR: confirmed placement.    Collier Bullock 01/31/2020

## 2020-01-31 NOTE — Progress Notes (Signed)
RT obtained post intubation ABG on the settings of 560/16/100%/+5. ABG critical PCO2 67.3 called to [facility] MD. MD changed rate from 16 to 19. RT will continue to monitor.   Results for [name], [name] (MRN [id number]) as of 01/31/2020 04:09  Ref. Range 01/31/2020 03:53  Sample type Unknown ARTERIAL  pH, Arterial Latest Ref Range: 7.35 - 7.45  7.307 (L)  pCO2 arterial Latest Ref Range: 32 - 48 mmHg 67.3 (HH)  pO2, Arterial Latest Ref Range: 83 - 108 mmHg 100  TCO2 Latest Ref Range: 22 - 32 mmol/L 35 (H)  Acid-Base Excess Latest Ref Range: 0.0 - 2.0 mmol/L 5.0 (H)  Bicarbonate Latest Ref Range: 20.0 - 28.0 mmol/L 33.2 (H)  O2 Saturation Latest Units: % 96.0  Patient temperature Unknown 101.0 F  Collection site Unknown Radial

## 2020-01-31 NOTE — Progress Notes (Signed)
RT obtained ABG on pt with the following results. Critical PCO2 of 68.1 reported to MD [facility]. RT will continue to monitor.   Results for [name], [name] (MRN [id number]) as of 01/31/2020 01:14  Ref. Range 01/31/2020 00:53  Sample type Unknown ARTERIAL  pH, Arterial Latest Ref Range: 7.35 - 7.45  7.306 (L)  pCO2 arterial Latest Ref Range: 32 - 48 mmHg 68.1 (HH)  pO2, Arterial Latest Ref Range: 83 - 108 mmHg 72 (L)  TCO2 Latest Ref Range: 22 - 32 mmol/L 35 (H)  Acid-Base Excess Latest Ref Range: 0.0 - 2.0 mmol/L 5.0 (H)  Bicarbonate Latest Ref Range: 20.0 - 28.0 mmol/L 33.5 (H)  O2 Saturation Latest Units: % 90.0  Patient temperature Unknown 101.0 F  Collection site Unknown Brachial

## 2020-01-31 NOTE — Progress Notes (Signed)
Nutrition Follow-up  DOCUMENTATION CODES:   Obesity unspecified  INTERVENTION:   Tube feeding via cortrak tube: Vital High Protein at 55 ml/h (1320 ml per day) Pro-stat 60 ml BID  Provides 1720 kcal, 175 gm protein, 1103 ml free water daily  200 ml free water every 4 hours Total free water: 2303 ml   NUTRITION DIAGNOSIS:   Inadequate oral intake related to inability to eat as evidenced by NPO status. Ongoing.   GOAL:   Patient will meet greater than or equal to 90% of their needs Meeting with TF.   MONITOR:   Weight trends, Labs, I & O's, TF tolerance  REASON FOR ASSESSMENT:   Consult Enteral/tube feeding initiation and management  ASSESSMENT:   Pt with a PMH significant for Afib, cardiomyopathy, PPM, coronary disease, HTN, legally blind, T2DM, HTN, and HLD presented with N/V, headache, recent fall, recent syncope and AMS. Pt admitted with ICH.  Spoke with RN, propofol off.  Per Palliative notes pt's family desires full scope of care at this time.   6/14 cortrak placed; Osmolite 1.5 @ 70 ml/hr with 30 ml Prostat TID: provides:  6/17 intubated early this am; s/p bronchoscopy with RML mucous plug   Patient is currently intubated on ventilator support MV: 10.1 L/min Temp (24hrs), Avg:99.7 F (37.6 C), Min:98.2 F (36.8 C), Max:101 F (38.3 C)  Propofol: weaned off per RN Medications reviewed and include: colace, SSI, 6 units novolog every 4 hours, 60 units levemir BID, miralax  Labs reviewed: CBG's: 273-310  TF: Osmolite 1.5 @ 70 ml/hr with 30 ml Prostat TID Provides: 2820 kcal and 150 grams protein  Diet Order:   Diet Order            Diet NPO time specified Except for: Sips with Meds  Diet effective now                 EDUCATION NEEDS:   Not appropriate for education at this time  Skin:  Skin Assessment: Skin Integrity Issues: Skin Integrity Issues:: Other (Comment) Other: amputation all L toes  Last BM:  6/10  Height:   Ht Readings from  Last 1 Encounters:  01/31/20 5\' 9"  (1.753 m)    Weight:   Wt Readings from Last 1 Encounters:  01/29/20 (!) 137 kg    Ideal Body Weight:  86.36 kg  BMI:  Body mass index is 44.6 kg/m.  Estimated Nutritional Needs:   Kcal:  1500-1900  Protein:  145-180 grams  Fluid:  2 L/day  Lockie Pares., RD, LDN, CNSC See AMiON for contact information

## 2020-01-31 NOTE — Consult Note (Signed)
NAME:  [name], MRN:  [id number], DOB:  [birth year], LOS: 6 ADMISSION DATE:  01/25/2020, CONSULTATION DATE:  01/31/2020 REFERRING MD:  Graciella Freer, CHIEF COMPLAINT:  Worsening AMS/ hypoxia  Brief History   61 yoM admitted 6/11 with small bilateral [name] requiring intermittent cleviprex for strict BP control, with hospitalization complicated by likely aspiration pneumonia, AKI on CKD, and worsening mental status now and desaturation episodes with increased O2 needs.  Mental status prevents BiPAP.  PCCM consulted for respiratory failure and further management.   History of present illness   HPI obtained from medical chart review as patient is unresponsive.   74 year old male with prior extensive medical history significant for blindness, prior ICH with previous trach/ PEG with some baseline aphasia, HFpEF, AS s/p TAVR 04/2019, afib not on AC due to GI bleeding and ICH, CAD, OSA, CKD stage IV, HTN, morbid obesity, and poorly controlled DMT2 with neuropathy who presented 6/11 with 3 day history of bifrontal headache, recent falls 2-3 weeks prior, increasing confusion/ syncope and nausea/ vomiting.  Found on workup to have small bilateral intraventricular hemorrhage of unclear etiology, suspected to be traumatic given fall history vs spontaneous.   MRI unable to be obtained given pacemaker.  Admitted to IMTS with neurology consulting.  Required intermittent cleviprex gtt for strict BP control. Hospitalization complicated by suspected aspiration pneumonia on 6/13 and started empirically on antibiotics, and worsening AKI on CKD, and progressive decline in mental status.  Serial imaging mostly stable.  Palliative care consulted to help with goals of care and Overnight, 6/16, he has had worsening mental status now, increased O2 needs, and desaturations. ABG obtained showed 7.306/ 68/ 72/ 33.5.   PCCM consulted for further management of respiratory failure.   Past Medical History  Blindness, OSA, prior ICH with  previous trach/ PEG, HFpEF, afib, AS s/p TAVR 04/2019, CAD, CKD stage IV, HTN, DMT2 poorly controlled with neuropathy, morbid obesity, HLD, GERD, bradycardia/ LBBB with pacemaker,   Significant Hospital Events   6/11 admitted IMTS to ICU, intermittently requiring cleviprex 6/13 concern for aspiration, abx started 6/15 requiring cleviprex gtt 6/17 worsening mental status/ hypoxia- intubated  Consults:  Neurology 6/11 Palliative care 6/15 PCCM 6/17  Procedures:  6/14 right nare cortrak >> 6/17 ETT >> 6/17 bronchoscopy-> RML mucous plug   Significant Diagnostic Tests:  6/11 CTH >> There is hemorrhage in the dependent portion of each lateral ventricle of uncertain etiology. No brain parenchymal hemorrhage elsewhere noted. No mass. No extra-axial fluid.  There is mild atrophy with extensive periventricular small vessel disease. No acute appearing infarct is evident.  There are multiple foci of arterial vascular calcification. There is mucosal thickening in several ethmoid air cells.  6/11 RUQ limited US >> Gallbladder sludge and gallstones without complicating factors.  Right renal cysts stable from prior CT examination.  6/12 CTH >> unchanged [name] layering within the occipital horns of the lateral ventricles  6/13 CTH >> 1. Increased but still small volume intraventricular hemorrhage layering in the occipital horns of the lateral ventricles. 2. Borderline increase in ventriculomegaly, recommend follow-up.  6/13 TTE >> LVEF 60-65%, mod LVH, no wall motion abnormality, H3ZJ, RV systolic function normal, mild LA dilation, post TAVR, mild dilation of aortic root  6/14 CTH >>  Stable hemorrhage layering in each occipital horn of the lateral ventricles, stable. No hemorrhage appreciable. There is atrophy with extensive supratentorial small vessel disease, stable. No acute infarct evident. There is extensive arterial vascular calcification at multiple sites. There is mucosal  thickening in  multiple ethmoid air cells.  6/16 CTH >> Stable intraventricular hemorrhage. Small amount of subarachnoid hemorrhage on the left also stable. No new hemorrhage or infarct Mild ventricular enlargement is stable.6/16 CTH >>  Micro Data:  6/11 SARS 2 >> neg 6/15 MRSA PCR >> neg 6/15 BCx2 >> ngtd >> 6/18 BAL  >>  Antimicrobials:  6/13 azithro >> 6/14 6/13 ceftriaxone >> 6/14 6/14 unasyn >> 6/16 6/17 vanc >> 6/17 cefepime >> 6/17 flagyl >>  Interim history/subjective:    Objective   Blood pressure 118/86, pulse 76, temperature (!) 101 F (38.3 C), temperature source Axillary, resp. rate (!) 34, height _0  (1.88 m), weight (!) 137 kg, SpO2 90 %.        Intake/Output Summary (Last 24 hours) at 01/31/2020 0200 Last data filed at 01/31/2020 0100 Gross per 24 hour  Intake 4549.1 ml  Output 650 ml  Net 3899.1 ml   Filed Weights   01/27/20 0428 01/28/20 0339 01/29/20 0438  Weight: (!) 143 kg (!) 139.4 kg (!) 137 kg   Examination: General:  Morbidly obese ill appearing male, unresponsive and agonal  HEENT: MM pink/dry, copious dried oral secretions, poor dentition, right nare cortrak Neuro:  Unresponsive to noxious stimuli  CV: rr, intermittently paced PULM:  Agonal, diminished more on right, scattered rhonchi GI: soft, hypo BS, condom cath not staying on  Extremities: warm/dry, generalized edema +1 Skin: no rashes   Resolved Hospital Problem list    Assessment & Plan:   Acute hypoxic / hypercarbic respiratory failure in the setting of aspiration pneumonia, mucous plugging, pulmonary edema, layering pleural effusions, and worsening encephalopathy P:  Intubate now then bronch Full MV support Post CXR/ ABG VAP bundle/ PPI PAD protocol if needed- propofol/ prn fentanyl with bowel regimen Cultures/ abx as below Add duonebs and chest PT   Sepsis  - likely aspiration pneumonia  P:  Follow BC sent 6/15 Send UA Follow  Broaden abx coverage for now   [name] - small,  bilateral, suspected etiology falls vs spontaneous  Encephalopathy, metabolic-  worsening likely in the setting of respiratory failure/ hypercarbia, sepsis given stable serial CTH, uremia P:  Trend neuro exams Will obtain CTH to rule out any intracranial changes/ extension of [name] Per Neurology  SBP goal < 160 Check TSH and repeat ammonia for completeness    HTN, acute on chronic diastolic HF P:  Weaning down cleviprex for SBP goal < 160 despite scheduled hydralazine, amlodipine, and lopressor Hold further diuresis for now  Hypernatremia- free water deficit 3.7L AKI on CKD stage IV Uremia - patient is actually down 12 Kg since admit, net positive 3.8L (however I/Os are inaccurate given his condom cath has not stayed on with urinary occurences  P:  Insert folley, check UA  Add free water per cortrak  Trend indices and strict I/Os Hold further diuresis for now Check renal ultrasound    Poorly controlled DMT2 P:  CBGs still in 200's despite resistant SSI, TF coverage and levemir 50 units BID Will increase levemir to 60 units BID (still not at 1 unit/ kg/day divided) and increase TF coverage to  No AG, may need insulin gtt if    Morbid obesity with BMI 38   Best practice:  Diet: TF at goal  Pain/Anxiety/Delirium protocol (if indicated): RASS goal 0, prn propofol / fentanyl prn if needed VAP protocol (if indicated): yes DVT prophylaxis: heparin SQ GI prophylaxis: PPI Glucose control: as above Mobility: BR Code Status: Full,  appreciate PMT helping with [place]  Family Communication:  Daughter, [name] updated by phone by Dr. Duwayne Heck Disposition: Neuro ICU  Labs   CBC: Recent Labs  Lab 01/26/20 0029 01/26/20 0029 01/27/20 8657 01/27/20 8469 01/28/20 6295 01/29/20 0748 01/30/20 0010 01/30/20 0715 01/31/20 0053  WBC 11.4*  --  12.2*  --  14.2* 14.7*  --  15.0*  --   NEUTROABS  --   --   --   --   --  11.5*  --   --   --   HGB 12.1*   < > 11.2*   < > 12.0* 12.6* 12.9*  11.6* 12.9*  HCT 40.2   < > 38.3*   < > 41.2 43.5 38.0* 41.4 38.0*  MCV 79.3*  --  80.3  --  81.7 82.4  --  84.3  --   PLT 198  --  192  --  201 216  --  159  --    < > = values in this interval not displayed.    Basic Metabolic Panel: Recent Labs  Lab 01/28/20 0928 01/28/20 0928 01/28/20 1420 01/28/20 1854 01/29/20 0748 01/29/20 0748 01/29/20 1152 01/29/20 1616 01/30/20 0010 01/30/20 0715 01/31/20 0053  NA 144   < >  --   --  150*   < > 149* 148* 150* 148* 148*  K 3.1*   < >  --   --  4.0   < > 4.1 4.0 4.1 4.1 4.5  CL 101  --   --   --  109  --  107 108  --  108  --   CO2 31  --   --   --  31  --  30 30  --  29  --   GLUCOSE 217*  --   --   --  348*  --  391* 294*  --  288*  --   BUN 72*  --   --   --  76*  --  80* 85*  --  94*  --   CREATININE 2.39*  --   --   --  2.41*  --  2.71* 2.77*  --  3.17*  --   CALCIUM 9.1  --   --   --  9.4  --  9.3 9.5  --  9.1  --   MG  --   --  2.1 2.1 2.4  --   --  2.4  --   --   --   PHOS  --   --  3.4 3.0 2.0*  --   --  2.2*  --   --   --    < > = values in this interval not displayed.   GFR: Estimated Creatinine Clearance: 31.5 mL/min (A) (by C-G formula based on SCr of 3.17 mg/dL (H)). Recent Labs  Lab 01/27/20 0317 01/28/20 0928 01/29/20 0748 01/29/20 1616 01/29/20 1852 01/30/20 0715  WBC 12.2* 14.2* 14.7*  --   --  15.0*  LATICACIDVEN  --   --   --  1.8 1.7  --     Liver Function Tests: Recent Labs  Lab 01/27/20 2002 01/28/20 0928 01/29/20 0748 01/29/20 1152 01/30/20 0715  AST 31 33 123* 99* 55*  ALT 83* 74* 126* 120* 98*  ALKPHOS 121 116 120 117 105  BILITOT 0.6 0.5 0.5 0.6 0.5  PROT 8.0 8.4* 8.1 7.8 7.6  ALBUMIN 3.1* 3.0* 2.9* 2.9* 2.8*   No results for  input(s): LIPASE, AMYLASE in the last 168 hours. Recent Labs  Lab 01/25/20 1356 01/27/20 2002 01/28/20 0928  AMMONIA 42* 23 36*    ABG    Component Value Date/Time   PHART 7.306 (L) 01/31/2020 0053   PCO2ART 68.1 (HH) 01/31/2020 0053   PO2ART 72 (L)  01/31/2020 0053   HCO3 33.5 (H) 01/31/2020 0053   TCO2 35 (H) 01/31/2020 0053   ACIDBASEDEF 1.1 10/02/2013 0353   O2SAT 90.0 01/31/2020 0053     Coagulation Profile: No results for input(s): INR, PROTIME in the last 168 hours.  Cardiac Enzymes: No results for input(s): CKTOTAL, CKMB, CKMBINDEX, TROPONINI in the last 168 hours.  HbA1C: Hgb A1c MFr Bld  Date/Time Value Ref Range Status  01/26/2020 12:29 AM 8.9 (H) 4.8 - 5.6 % Final    Comment:    (NOTE) Pre diabetes:          5.7%-6.4%  Diabetes:              >6.4%  Glycemic control for   <7.0% adults with diabetes   05/07/2019 09:32 AM 8.0 (H) 4.8 - 5.6 % Final    Comment:    (NOTE) Pre diabetes:          5.7%-6.4% Diabetes:              >6.4% Glycemic control for   <7.0% adults with diabetes     CBG: Recent Labs  Lab 01/30/20 1148 01/30/20 1514 01/30/20 1916 01/30/20 2319 01/31/20 0149  GLUCAP 270* 264* 324* 351* 354*    Review of Systems:   Unable   Past Medical History  He,  has a past medical history of Arthritis, Atrial fibrillation ([place]), Benign prostatic hypertrophy, Cardiomyopathy, hypertensive ([place]), Coronary artery disease, Degenerative joint disease, Diabetes type 2, uncontrolled ([place]), Diabetic peripheral neuropathy ([place]), Diabetic ulcer of thigh ([place]), Exertional shortness of breath, GERD (gastroesophageal reflux disease), Gout, Herpes, Hyperlipidemia, Hypertension, Hypotension (09/05/2013), Incarcerated ventral hernia, LBBB (left bundle branch block), Legally blind, Morbid obesity ([place]), Obstructive sleep apnea, Occult blood positive stool, Pacemaker (2005), and Sepsis ([place]) (08/26/2013).   Surgical History    Past Surgical History:  Procedure Laterality Date  . AMPUTATION Left 06/22/2013   Procedure: AMPUTATION FOOT;  Surgeon: Newt Minion, MD;  Location: [facility];  Service: Orthopedics;  Laterality: Left;  Left Midfoot Amputation  . CARDIAC CATHETERIZATION  10/22/2003   Dilatation of the LV with  preserved LV systolic function. Tortous aorta, but no AAA. Moderate disease with 50-60% area of narrowing in the circumflex after the second OM and 70% mid narrowing in the LAD. No intervention.  Marland Kitchen CARDIOVASCULAR STRESS TEST  01/23/2010   Perfusion defect seen in inferior myocardial region is consistent with diaphragmatic attenuation. Remaining myocardium demonstrates normal myocardial perfusion with no evidence of ischemia or infarct. Stress ECG was non-diagnostic due to LBBB.  Marland Kitchen CATARACT EXTRACTION, BILATERAL Bilateral   . ESOPHAGOGASTRODUODENOSCOPY N/A 10/12/2013   Procedure: ESOPHAGOGASTRODUODENOSCOPY (EGD);  Surgeon: Gwenyth Ober, MD;  Location: [facility];  Service: General;  Laterality: N/A;  bedside  . EYE SURGERY    . 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 HERNIA REPAIR    . INSERT / REPLACE / REMOVE PACEMAKER  11/02/2003   Secondary to symptomatic bradycardia. DDDR pacemaker. By Dr. Rollene Fare.  Randolm Idol / REPLACE / REMOVE PACEMAKER  November 01, 2012   "old pacemaker died; had new one put in" (02-Jul-2013)  . Pacemaker lead revision  12/2003  likely microdislodgment  of right ventricular lead  . PEG PLACEMENT N/A 10/12/2013   Procedure: PERCUTANEOUS ENDOSCOPIC GASTROSTOMY (PEG) PLACEMENT;  Surgeon: Gwenyth Ober, MD;  Location: [facility];  Service: General;  Laterality: N/A;  . PERMANENT PACEMAKER GENERATOR CHANGE N/A 11/03/2012   Procedure: PERMANENT PACEMAKER GENERATOR CHANGE;  Surgeon: Sanda Klein, MD;  Location: [facility] C[facility] LAB;  Service: Cardiovascular;  Laterality: N/A;  . REFRACTIVE SURGERY Bilateral   . RIGHT HEART CATH N/A 09/05/2018   Procedure: RIGHT HEART CATH;  Surgeon: Larey Dresser, MD;  Location: [facility] CV LAB;  Service: Cardiovascular;  Laterality: N/A;  . RIGHT/LEFT HEART CATH AND CORONARY ANGIOGRAPHY N/A 04/10/2019   Procedure: RIGHT/LEFT HEART CATH AND CORONARY ANGIOGRAPHY;  Surgeon: [name], MD;  Location: [facility]  LAB;  Service: Cardiovascular;  Laterality: N/A;  . TEE WITHOUT CARDIOVERSION N/A 09/05/2018   Procedure: TRANSESOPHAGEAL ECHOCARDIOGRAM (TEE);  Surgeon: Larey Dresser, MD;  Location[facility];  Service: Cardiovascular;  Laterality: N/A;  . TEE WITHOUT CARDIOVERSION N/A 05/08/2019   Procedure: TRANSESOPHAGEAL ECHOCARDIOGRAM (TEE);  Surgeon: [name], MD;  Location: [facility];  Service: Open Heart Surgery;  Laterality: N/A;  . TRANSCATHETER AORTIC VALVE REPLACEMENT, TRANSFEMORAL N/A 05/08/2019   Procedure: TRANSCATHETER AORTIC VALVE REPLACEMENT, TRANSFEMORAL;  Surgeon: [name], MD;  Location: [facility] CV LAB;  Service: Open Heart Surgery;  Laterality: N/A;  . TRANSTHORACIC ECHOCARDIOGRAM  01/01/2013   EF 50-55%, ventricular septum-thickness moderately increased  . VENOUS DOPPLER  01/17/2013   No evidence of thrombus or thrombophlebitis. Left GSVs appear enlarged and demonstrate valvular insuffiiciency. Bilateral SSVs appeared patent with no valvular insufficiency seen.  Marland Kitchen VENTRAL HERNIA REPAIR  07/2005    S/P laparotomy, lysis of adhesions, repair of incarcerated ventral hernia with  partial omentectomy.. Performed by Dr. Marlou Starks.     Social History   reports that he quit smoking about 16 years ago. His smoking use included cigarettes. He smoked 0.00 packs per day for 0.00 years. He has never used smokeless tobacco. He reports current alcohol use. He reports that he does not use drugs.   Family History   His family history includes Allergies in his sister; Asthma in his brother; Coronary artery disease in his sister; Heart disease in his sister; Rheum arthritis in his brother.   Allergies No Known Allergies   Home Medications  Prior to Admission medications   Medication Sig Start Date End Date Taking? Authorizing Provider  acetaminophen (TYLENOL) 650 MG CR tablet Take 1,300 mg by mouth 2 (two) times daily.    Yes [provider]  amLODipine (NORVASC) 10 MG  tablet Take 10 mg by mouth in the morning. Reported on 12/09/2015   Yes [provider]  amoxicillin (AMOXIL) 500 MG capsule Take 4 capsules by mouth one hour prior to dental work Patient taking differently: Take 2,000 mg by mouth See admin instructions. Take 4 capsules (2,000 mg) by mouth one hour prior to dental work 06/07/19  Yes Eileen Stanford, PA-C  antiseptic oral rinse (BIOTENE) LIQD 15 mLs by Mouth Rinse route 3 (three) times daily as needed for dry mouth.    Yes [provider]  aspirin EC 81 MG tablet Take 1 tablet (81 mg total) by mouth daily. 01/19/19  Yes Clegg, Amy D, NP  Cholecalciferol (VITAMIN D-3) 25 MCG (1000 UT) CAPS Take 1,000 Units by mouth in the morning.   Yes [provider]  citalopram (CELEXA) 20 MG tablet Take 20 mg by  mouth in the morning.    Yes [provider]  famotidine (PEPCID) 20 MG tablet Take 20 mg by mouth in the morning.    Yes [provider]  Febuxostat (ULORIC) 80 MG TABS Take 80 mg by mouth in the morning.    Yes [provider]  gabapentin (NEURONTIN) 300 MG capsule Take 300 mg by mouth 2 (two) times daily.   Yes [provider]  hydrALAZINE (APRESOLINE) 100 MG tablet Take 1 tablet (100 mg total) by mouth 3 (three) times daily. 01/16/20  Yes Larey Dresser, MD  insulin glargine (SEMGLEE) 100 UNIT/ML Solostar Pen Inject 100 Units into the skin at bedtime.   Yes [provider]  Liraglutide (VICTOZA) 18 MG/3ML SOPN Inject 1.8 mg into the skin daily.   Yes [provider]  loperamide (IMODIUM A-D) 2 MG tablet Take 1 mg by mouth 3 (three) times daily as needed for diarrhea or loose stools.   Yes [provider]  Menthol, Topical Analgesic, (BIOFREEZE EX) Apply 1 application topically 2 (two) times daily as needed (knee/shoulder pain).    Yes [provider]  metolazone (ZAROXOLYN) 2.5 MG tablet Take 2.5 mg by mouth daily.   Yes [provider]    metoprolol succinate (TOPROL-XL) 50 MG 24 hr tablet Take 1 tablet (50 mg total) by mouth daily. Take with or immediately following a meal. 03/21/18  Yes Larey Dresser, MD  OXYGEN Inhale 2 L/min into the lungs continuous.   Yes [provider]  polyethylene glycol (MIRALAX / GLYCOLAX) packet Take 17 g by mouth daily as needed (for hard stools- MIX AS DIRECTED AND DRINK).    Yes [provider]  potassium chloride SA (KLOR-CON) 20 MEQ tablet Take 80 mEq by mouth 2 (two) times daily.   Yes [provider]  rosuvastatin (CRESTOR) 20 MG tablet Take 20 mg by mouth at bedtime.    Yes [provider]  sennosides-docusate sodium (SENOKOT-S) 8.6-50 MG tablet Take 2 tablets by mouth 2 (two) times daily.    Yes [provider]  Skin Protectants, Misc. (EUCERIN) cream Apply 1 application topically as needed for dry skin (to affected areas of the body).    Yes [provider]  torsemide (DEMADEX) 100 MG tablet Take 100 mg by mouth 2 (two) times daily.   Yes [provider]     Critical care time: 60 mins    Kennieth Rad, MSN, AGACNP-BC [facility] 01/31/2020, 3:45 AM  See Amion for personal pager PCCM on call pager [phone]

## 2020-02-01 ENCOUNTER — Inpatient Hospital Stay (HOSPITAL_COMMUNITY): Payer: Medicare (Managed Care)

## 2020-02-01 LAB — CBC
HCT: 36.1 % — ABNORMAL LOW (ref 39.0–52.0)
Hemoglobin: 10.2 g/dL — ABNORMAL LOW (ref 13.0–17.0)
MCH: 23.6 pg — ABNORMAL LOW (ref 26.0–34.0)
MCHC: 28.3 g/dL — ABNORMAL LOW (ref 30.0–36.0)
MCV: 83.6 fL (ref 80.0–100.0)
Platelets: UNDETERMINED 10*3/uL (ref 150–400)
RBC: 4.32 MIL/uL (ref 4.22–5.81)
RDW: 16.5 % — ABNORMAL HIGH (ref 11.5–15.5)
WBC: 15.8 10*3/uL — ABNORMAL HIGH (ref 4.0–10.5)
nRBC: 0 % (ref 0.0–0.2)

## 2020-02-01 LAB — BASIC METABOLIC PANEL
Anion gap: 14 (ref 5–15)
BUN: 131 mg/dL — ABNORMAL HIGH (ref 8–23)
CO2: 26 mmol/L (ref 22–32)
Calcium: 8.7 mg/dL — ABNORMAL LOW (ref 8.9–10.3)
Chloride: 110 mmol/L (ref 98–111)
Creatinine, Ser: 3.48 mg/dL — ABNORMAL HIGH (ref 0.61–1.24)
GFR calc Af Amer: 19 mL/min — ABNORMAL LOW (ref >60)
GFR calc non Af Amer: 17 mL/min — ABNORMAL LOW (ref >60)
Glucose, Bld: 170 mg/dL — ABNORMAL HIGH (ref 70–99)
Potassium: 4.7 mmol/L (ref 3.5–5.1)
Sodium: 150 mmol/L — ABNORMAL HIGH (ref 135–145)

## 2020-02-01 LAB — GLUCOSE, CAPILLARY
Glucose-Capillary: 230 mg/dL — ABNORMAL HIGH (ref 70–99)
Glucose-Capillary: 162 mg/dL — ABNORMAL HIGH (ref 70–99)
Glucose-Capillary: 160 mg/dL — ABNORMAL HIGH (ref 70–99)
Glucose-Capillary: 112 mg/dL — ABNORMAL HIGH (ref 70–99)
Glucose-Capillary: 118 mg/dL — ABNORMAL HIGH (ref 70–99)
Glucose-Capillary: 150 mg/dL — ABNORMAL HIGH (ref 70–99)

## 2020-02-01 LAB — UREA NITROGEN, URINE: Urea Nitrogen, Ur: 634 mg/dL

## 2020-02-01 LAB — TRIGLYCERIDES: Triglycerides: 86 mg/dL (ref <150)

## 2020-02-01 NOTE — Progress Notes (Addendum)
Palliative Medicine Inpatient Follow Up Note   HPI: 74 y.o.malewith past medical history of stroke requiring trach/PEG previously, HFpEF, atrial fibrillation, severe aortic stenosis s/p TAVR, CKD stage IV, hypertension, OHS/OSA uses CPAP, diabetes, s/p left foot amputationadmitted on 6/11/2021with ongoing headache and CT head bilateral intraventricular hemorrhage.Hospitalization complicated by ongoing poor mental status, aspiration pneumonia, hypertension, and concern for airway protection.  Today's Discussion (02/02/2020): Chart reviewed. Called patients daughter, [name] in the setting of recent intubation to discuss the plan moving forward. [name] and I met at bedside this afternoon. I asked [name] how she feels her father is doing. She stated that she knows he looks "bad" and that he has "a lot against him" but that she has "seen him here before". She continues to share with me that she has seem him ill like this in the past and he has recovered from that.   I asked [name] what her belief is in terms of quality of life for her father. She stated that from what she knows about him, an acceptable quality of life is one where the patient can interact with his family. She states that he was doing that well prior to this event. I asked her if she had ever thought about if he would want to go through what he went through before, more specifically a tracheostomy and g-tube. [name] says that from prior conversations [place] would want every effort made to sustain his life. She said that she is honoring his wishes at the present time. We talked about the hope for improvement. We discussed that even if he improved from this event there is concern that something like this could very easily happen again. She said that she is aware of this and if her father becomes more coherent she would want to discuss that with him further.   I again brought up that Brison is an incredibly sick man even under the best  circumstances he is at high risk for rapid health deterioration. She states that she is very much aware of this. At the present time [name] would like to continue with the clinical course that he has been on.   Discussed the importance of continued conversation with family and their  medical providers regarding overall plan of care and treatment options, ensuring decisions are within the context of the patients values and GOCs.  Vital Signs Vitals:   02/01/20 0903 02/01/20 1000  BP:  (!) 157/95  Pulse:  85  Resp:  17  Temp:    SpO2: 96% 98%   Physical Exam Constitutional:      General: He is in no acute distress.     Appearance: He is morbidly obese. He is ill-appearing.  Cardiovascular:     Rate and Rhythm: Normal rate.  Pulmonary:     On ventilator  Abdominal:     Palpations: Abdomen is soft.  Neurological:     Mental Status: He is unresponsive.    Recommendations and Plan: - Continue aggressive measures for the time being  - PMT to check in incrementally if no improvements are made will again need a formal [place] meeting potentially with all five children pending clinical course  Time Spent: 35 Greater than 50% of the time was spent in counseling and coordination of care ______________________________________________________________________________________ [place] Team Team Cell Phone: [phone] Please utilize secure chat with additional questions, if there is no response within 30 minutes please call the above phone number  Palliative Medicine Team  providers are available by phone from 7am to 7pm daily and can be reached through the team cell phone.  Should this patient require assistance outside of these hours, please call the patient's attending physician.

## 2020-02-01 NOTE — Progress Notes (Signed)
PT Cancellation Note  Patient Details Name: [name] MRN: [id number] DOB: Dec 25, 1945   Cancelled Treatment:    Reason Eval/Treat Not Completed: Medical issues which prohibited therapy. Per RN pt requiring increase in sedation this morning due to restless. PT will follow up when pt is less sedated and better able to participate in skilled PT intervention.   Zenaida Niece 02/01/2020, 9:52 AM

## 2020-02-01 NOTE — Progress Notes (Signed)
NAME:  [name], MRN:  [id number], DOB:  1946/06/19, LOS: 7 ADMISSION DATE:  01/25/2020, CONSULTATION DATE:  01/31/2020 REFERRING MD:  Graciella Freer, CHIEF COMPLAINT:  Worsening AMS/ hypoxia  Brief History   59 yoM admitted 6/11 with small bilateral [name] requiring intermittent cleviprex for strict BP control, with hospitalization complicated by likely aspiration pneumonia, AKI on CKD, and worsening mental status now and desaturation episodes with increased O2 needs.  Mental status prevents BiPAP.  PCCM consulted for respiratory failure and further management.   History of present illness   HPI obtained from medical chart review as patient is unresponsive.   74 year old male with prior extensive medical history significant for blindness, prior ICH with previous trach/ PEG with some baseline aphasia, HFpEF, AS s/p TAVR 04/2019, afib not on AC due to GI bleeding and ICH, CAD, OSA, CKD stage IV, HTN, morbid obesity, and poorly controlled DMT2 with neuropathy who presented 6/11 with 3 day history of bifrontal headache, recent falls 2-3 weeks prior, increasing confusion/ syncope and nausea/ vomiting.  Found on workup to have small bilateral intraventricular hemorrhage of unclear etiology, suspected to be traumatic given fall history vs spontaneous.   MRI unable to be obtained given pacemaker.  Admitted to IMTS with neurology consulting.  Required intermittent cleviprex gtt for strict BP control. Hospitalization complicated by suspected aspiration pneumonia on 6/13 and started empirically on antibiotics, and worsening AKI on CKD, and progressive decline in mental status.  Serial imaging mostly stable.  Palliative care consulted to help with goals of care and Overnight, 6/16, he has had worsening mental status now, increased O2 needs, and desaturations. ABG obtained showed 7.306/ 68/ 72/ 33.5.   PCCM consulted for further management of respiratory failure.   Past Medical History  Blindness, OSA, prior ICH with  previous trach/ PEG, HFpEF, afib, AS s/p TAVR 04/2019, CAD, CKD stage IV, HTN, DMT2 poorly controlled with neuropathy, morbid obesity, HLD, GERD, bradycardia/ LBBB with pacemaker,   Significant Hospital Events   6/11 admitted IMTS to ICU, intermittently requiring cleviprex 6/13 concern for aspiration, abx started 6/15 requiring cleviprex gtt 6/17 worsening mental status/ hypoxia- intubated  Consults:  Neurology 6/11 Palliative care 6/15 PCCM 6/17  Procedures:  6/14 right nare cortrak >> 6/17 ETT >> 6/17 bronchoscopy-> RML mucous plug   Significant Diagnostic Tests:  6/11 CTH >> There is hemorrhage in the dependent portion of each lateral ventricle of uncertain etiology. No brain parenchymal hemorrhage elsewhere noted. No mass. No extra-axial fluid.  There is mild atrophy with extensive periventricular small vessel disease. No acute appearing infarct is evident.  There are multiple foci of arterial vascular calcification. There is mucosal thickening in several ethmoid air cells.  6/11 RUQ limited US >> Gallbladder sludge and gallstones without complicating factors.  Right renal cysts stable from prior CT examination.  6/12 CTH >> unchanged [name] layering within the occipital horns of the lateral ventricles  6/13 CTH >> 1. Increased but still small volume intraventricular hemorrhage layering in the occipital horns of the lateral ventricles. 2. Borderline increase in ventriculomegaly, recommend follow-up.  6/13 TTE >> LVEF 60-65%, mod LVH, no wall motion abnormality, W2HE, RV systolic function normal, mild LA dilation, post TAVR, mild dilation of aortic root  6/14 CTH >>  Stable hemorrhage layering in each occipital horn of the lateral ventricles, stable. No hemorrhage appreciable. There is atrophy with extensive supratentorial small vessel disease, stable. No acute infarct evident. There is extensive arterial vascular calcification at multiple sites. There is mucosal  thickening in  multiple ethmoid air cells.  6/16 CTH >> Stable intraventricular hemorrhage. Small amount of subarachnoid hemorrhage on the left also stable. No new hemorrhage or infarct Mild ventricular enlargement is stable  6/17 CTH >>  Stable exam with small amount of bilateral intraventricular hemorrhage and subarachnoid overlying L parietal lobe.  No new hemorrhage    6/17 Renal US: no hydronephrosis. Increased cortical echogenicity of bilateral kidneys. Bilateral renal cysts. L cyst arising form inferior pole and complicated of internal areas with increased echogenicity    Micro Data:  6/11 SARS 2 >> neg 6/15 MRSA PCR >> neg 6/15 BCx2 >> ngtd >> 6/18 BAL  >> moderate WBC, no orgs seen. Reincubated for better growth   Antimicrobials:  6/13 azithro >> 6/14 6/13 ceftriaxone >> 6/14 6/14 unasyn >> 6/16 6/17 vanc >>6/18 6/17 cefepime >> 6/17 flagyl >>6/18  Interim history/subjective:  NAEO More awake this morning, minimal sedation, off cleviprex   Objective   Blood pressure (!) 121/59, pulse 74, temperature 98.1 F (36.7 C), temperature source Axillary, resp. rate 19, height _0  (1.753 m), weight (!) 137.8 kg, SpO2 96 %.    Vent Mode: PRVC FiO2 (%):  [60 %-80 %] 60 % Set Rate:  [19 bmp] 19 bmp Vt Set:  [560 mL] 560 mL PEEP:  [5 cmH20] 5 cmH20 Plateau Pressure:  [10 cmH20-22 cmH20] 19 cmH20   Intake/Output Summary (Last 24 hours) at 02/01/2020 1000 Last data filed at 02/01/2020 0800 Gross per 24 hour  Intake 2680.25 ml  Output 2550 ml  Net 130.25 ml   Filed Weights   01/28/20 0339 01/29/20 0438 02/01/20 0500  Weight: (!) 139.4 kg (!) 137 kg (!) 137.8 kg   Examination: General:  Obese chronically and critically ill appearing older adult M, intubated sedated NAD  HEENT: NCAT. Neck with healed tracheostomy scar. ETT secure.  Neuro:  Sedated. Grimaces to painful stimuli, opens eyes to stimulation. Does not follow commands. Moving spontaneously.  CV: RRR s1s2 no rgm cap refill <  3 seconds  PULM:  Diminished bibasilar sounds. Scattered rhonchi. Symmetrical chest expansion. Mechanically ventilated  GI: Soft, obese, distant bowel sounds  Extremities: L pedal digit amputations. Symmetrical muscle bulk. No cyanosis Skin: c/d/w without rash   Resolved Hospital Problem list    Assessment & Plan:   Acute encephalopathy, improving -likely multifactorial in setting of hypoxia/hypercarpia,  uremia, infection. -CT H stable. Ammonia mildly elevated, doubt HE  P -Delirium precautions, minimize sedation as able -Correct metabolic abnormalities as able  Acute hypoxic and hypercarbic respiratory failure requiring intubation in setting of: -aspiration PNA, mucous plugging, pulmonary edema. Morbid obesity may be contributing to degree-- wonder if baseline hypercarbia / OHS? P:  -WUA/SBT as able, with full vent support when not able to wean  -VAP, PAD -has had trach/peg in past   Sepsis, likely aspiration PNA P:  -Follow up cx data -narrowing abx to just cefepime.  -follow WBC, fever curve   [name] - small, bilateral, stable  suspected etiology falls vs spontaneous  P:  -neuro has signed off -SBP goal < 160  -supportive care -long term BP goal normotensive   HTN Acute on chronic diastolic HF P:  -continue hydral norvasc metop. PRN norvasc - SBP goal < 160  -dc cleviprex   AKI on CKD stage IV -bilateral renal cysts. L side arising from inferior pole, complicated by internal areas on increased echogenicity which amy reflect septations  P:  - AM BMP -Trend UOP  - consider  non-emergent renal MRI w/wo contrast when medically more stabilized for   DM2, poorly controlled P:  -Levemir 60 units BID, Resistant SSI, Novolog 6units q4 hr enteral coverage  Morbid obesity with BMI 38 -suspect possible degree of OHS contributing to hypercarbia  Goals of Care: -appreciate Palliative Care assistance in establishing Banks. It seems that at last engagement, family wished for  full scope aggressive care.  -I see pt has previously had trach/peg  Best practice:  Diet: EN Pain/Anxiety/Delirium protocol (if indicated): RASS goal 0, prn propofol / fentanyl prn if needed VAP protocol (if indicated): yes DVT prophylaxis: heparin SQ GI prophylaxis: PPI Glucose control: as above Mobility: BR Code Status: Full. Family Communication:  Pending 6/18 Disposition: ICU  Labs   CBC: Recent Labs  Lab 01/27/20 0317 01/27/20 0317 01/28/20 4627 01/28/20 0928 01/29/20 0748 01/30/20 0010 01/30/20 0715 01/31/20 0053 01/31/20 0353 01/31/20 0706 01/31/20 0803  WBC 12.2*  --  14.2*  --  14.7*  --  15.0*  --   --  15.0*  --   NEUTROABS  --   --   --   --  11.5*  --   --   --   --   --   --   HGB 11.2*   < > 12.0*   < > 12.6*   < > 11.6* 12.9* 12.6* 10.3* 10.9*  HCT 38.3*   < > 41.2   < > 43.5   < > 41.4 38.0* 37.0* 37.1* 32.0*  MCV 80.3  --  81.7  --  82.4  --  84.3  --   --  85.7  --   PLT 192  --  201  --  216  --  159  --   --  129*  --    < > = values in this interval not displayed.    Basic Metabolic Panel: Recent Labs  Lab 01/28/20 0928 01/28/20 1420 01/28/20 1854 01/29/20 0748 01/29/20 0748 01/29/20 1152 01/29/20 1152 01/29/20 1616 01/30/20 0010 01/30/20 0715 01/31/20 0053 01/31/20 0353 01/31/20 0706 01/31/20 0803  NA   < >  --   --  150*   < > 149*   < > 148*   < > 148* 148* 149* 147* 148*  K   < >  --   --  4.0   < > 4.1   < > 4.0   < > 4.1 4.5 4.6 4.8 4.4  CL   < >  --   --  109  --  107  --  108  --  108  --   --  106  --   CO2   < >  --   --  31  --  30  --  30  --  29  --   --  29  --   GLUCOSE   < >  --   --  348*  --  391*  --  294*  --  288*  --   --  303*  --   BUN   < >  --   --  76*  --  80*  --  85*  --  94*  --   --  115*  --   CREATININE   < >  --   --  2.41*  --  2.71*  --  2.77*  --  3.17*  --   --  3.87*  --   CALCIUM   < >  --   --  9.4  --  9.3  --  9.5  --  9.1  --   --  8.8*  --   MG  --  2.1 2.1 2.4  --   --   --  2.4  --    --   --   --   --   --   PHOS  --  3.4 3.0 2.0*  --   --   --  2.2*  --   --   --   --  5.9*  --    < > = values in this interval not displayed.   GFR: Estimated Creatinine Clearance: 24.1 mL/min (A) (by C-G formula based on SCr of 3.87 mg/dL (H)). Recent Labs  Lab 01/28/20 0928 01/29/20 0748 01/29/20 1616 01/29/20 1852 01/30/20 0715 01/31/20 0706  WBC 14.2* 14.7*  --   --  15.0* 15.0*  LATICACIDVEN  --   --  1.8 1.7  --   --     Liver Function Tests: Recent Labs  Lab 01/28/20 0928 01/29/20 0748 01/29/20 1152 01/30/20 0715 01/31/20 0706  AST 33 123* 99* 55* 35  ALT 74* 126* 120* 98* 71*  ALKPHOS 116 120 117 105 86  BILITOT 0.5 0.5 0.6 0.5 0.3  PROT 8.4* 8.1 7.8 7.6 6.9  ALBUMIN 3.0* 2.9* 2.9* 2.8* 2.2*   No results for input(s): LIPASE, AMYLASE in the last 168 hours. Recent Labs  Lab 01/25/20 1356 01/27/20 2002 01/28/20 0928 01/31/20 0706  AMMONIA 42* 23 36* 44*    ABG    Component Value Date/Time   PHART 7.399 01/31/2020 0803   PCO2ART 51.1 (H) 01/31/2020 0803   PO2ART 128 (H) 01/31/2020 0803   HCO3 31.4 (H) 01/31/2020 0803   TCO2 33 (H) 01/31/2020 0803   ACIDBASEDEF 1.1 10/02/2013 0353   O2SAT 99.0 01/31/2020 0803     Coagulation Profile: No results for input(s): INR, PROTIME in the last 168 hours.  Cardiac Enzymes: No results for input(s): CKTOTAL, CKMB, CKMBINDEX, TROPONINI in the last 168 hours.  HbA1C: Hgb A1c MFr Bld  Date/Time Value Ref Range Status  01/26/2020 12:29 AM 8.9 (H) 4.8 - 5.6 % Final    Comment:    (NOTE) Pre diabetes:          5.7%-6.4%  Diabetes:              >6.4%  Glycemic control for   <7.0% adults with diabetes   05/07/2019 09:32 AM 8.0 (H) 4.8 - 5.6 % Final    Comment:    (NOTE) Pre diabetes:          5.7%-6.4% Diabetes:              >6.4% Glycemic control for   <7.0% adults with diabetes     CBG: Recent Labs  Lab 01/31/20 1535 01/31/20 1915 01/31/20 2308 02/01/20 0320 02/01/20 0748  GLUCAP 290*  257* 266* 230* 162*    CRITICAL CARE Performed by: Cristal Generous   Total critical care time: 40 minutes  Critical care time was exclusive of separately billable procedures and treating other patients. Critical care was necessary to treat or prevent imminent or life-threatening deterioration.  Critical care was time spent personally by me on the following activities: development of treatment plan with patient and/or surrogate as well as nursing, discussions with consultants, evaluation of patient's response to treatment, examination of patient, obtaining history from patient or surrogate, ordering and performing treatments and interventions, ordering and review of laboratory  studies, ordering and review of radiographic studies, pulse oximetry and re-evaluation of patient's condition.  Eliseo Gum MSN, AGACNP-BC [place] [phone] If no answer, [phone] 02/01/2020, 10:00 AM

## 2020-02-02 ENCOUNTER — Inpatient Hospital Stay (HOSPITAL_COMMUNITY): Payer: Medicare (Managed Care)

## 2020-02-02 DIAGNOSIS — T17908A Unspecified foreign body in respiratory tract, part unspecified causing other injury, initial encounter: Secondary | ICD-10-CM

## 2020-02-02 DIAGNOSIS — N179 Acute kidney failure, unspecified: Secondary | ICD-10-CM

## 2020-02-02 LAB — BASIC METABOLIC PANEL
Anion gap: 10 (ref 5–15)
BUN: 124 mg/dL — ABNORMAL HIGH (ref 8–23)
CO2: 29 mmol/L (ref 22–32)
Calcium: 9 mg/dL (ref 8.9–10.3)
Chloride: 114 mmol/L — ABNORMAL HIGH (ref 98–111)
Creatinine, Ser: 2.97 mg/dL — ABNORMAL HIGH (ref 0.61–1.24)
GFR calc Af Amer: 23 mL/min — ABNORMAL LOW (ref >60)
GFR calc non Af Amer: 20 mL/min — ABNORMAL LOW (ref >60)
Glucose, Bld: 126 mg/dL — ABNORMAL HIGH (ref 70–99)
Potassium: 3.4 mmol/L — ABNORMAL LOW (ref 3.5–5.1)
Sodium: 153 mmol/L — ABNORMAL HIGH (ref 135–145)

## 2020-02-02 LAB — CULTURE, RESPIRATORY W GRAM STAIN
Culture: NORMAL
Culture: NO GROWTH

## 2020-02-02 LAB — MAGNESIUM: Magnesium: 2.8 mg/dL — ABNORMAL HIGH (ref 1.7–2.4)

## 2020-02-02 LAB — CBC
HCT: 37 % — ABNORMAL LOW (ref 39.0–52.0)
Hemoglobin: 10.5 g/dL — ABNORMAL LOW (ref 13.0–17.0)
MCH: 23.8 pg — ABNORMAL LOW (ref 26.0–34.0)
MCHC: 28.4 g/dL — ABNORMAL LOW (ref 30.0–36.0)
MCV: 83.9 fL (ref 80.0–100.0)
RBC: 4.41 MIL/uL (ref 4.22–5.81)
RDW: 16.7 % — ABNORMAL HIGH (ref 11.5–15.5)
WBC: 14.5 10*3/uL — ABNORMAL HIGH (ref 4.0–10.5)
nRBC: 0 % (ref 0.0–0.2)

## 2020-02-02 LAB — GLUCOSE, CAPILLARY
Glucose-Capillary: 144 mg/dL — ABNORMAL HIGH (ref 70–99)
Glucose-Capillary: 124 mg/dL — ABNORMAL HIGH (ref 70–99)
Glucose-Capillary: 169 mg/dL — ABNORMAL HIGH (ref 70–99)
Glucose-Capillary: 146 mg/dL — ABNORMAL HIGH (ref 70–99)
Glucose-Capillary: 132 mg/dL — ABNORMAL HIGH (ref 70–99)
Glucose-Capillary: 139 mg/dL — ABNORMAL HIGH (ref 70–99)

## 2020-02-02 LAB — TRIGLYCERIDES: Triglycerides: 104 mg/dL (ref <150)

## 2020-02-02 LAB — PHOSPHORUS: Phosphorus: 4.3 mg/dL (ref 2.5–4.6)

## 2020-02-02 MED ORDER — DEXMEDETOMIDINE HCL IN NACL 400 MCG/100ML IV SOLN
0.4000 ug/kg/h | INTRAVENOUS | Status: DC
  Administered 2020-02-02: 0.5 ug/kg/h via INTRAVENOUS
  Administered 2020-02-02 – 2020-02-03 (×5): 0.4 ug/kg/h via INTRAVENOUS
  Administered 2020-02-04: 0.5 ug/kg/h via INTRAVENOUS
  Administered 2020-02-04 (×3): 0.6 ug/kg/h via INTRAVENOUS
  Administered 2020-02-05 (×2): 0.8 ug/kg/h via INTRAVENOUS
  Administered 2020-02-05 – 2020-02-06 (×4): 0.9 ug/kg/h via INTRAVENOUS
  Administered 2020-02-06: 1.1 ug/kg/h via INTRAVENOUS
  Administered 2020-02-06: 0.9 ug/kg/h via INTRAVENOUS
  Administered 2020-02-06: 0.8 ug/kg/h via INTRAVENOUS
  Administered 2020-02-06: 0.6 ug/kg/h via INTRAVENOUS
  Administered 2020-02-06: 0.7 ug/kg/h via INTRAVENOUS
  Administered 2020-02-06: 1 ug/kg/h via INTRAVENOUS
  Administered 2020-02-07: 0.7 ug/kg/h via INTRAVENOUS
  Administered 2020-02-08: 0.4 ug/kg/h via INTRAVENOUS
  Filled 2020-02-02: qty 200
  Filled 2020-02-02: qty 100
  Filled 2020-02-02: qty 200
  Filled 2020-02-02 (×20): qty 100
  Filled 2020-02-02: qty 200

## 2020-02-02 MED ORDER — CHLORHEXIDINE GLUCONATE CLOTH 2 % EX PADS
6.0000 | MEDICATED_PAD | Freq: Every day | CUTANEOUS | Status: DC
  Administered 2020-02-03 – 2020-03-09 (×31): 6 via TOPICAL

## 2020-02-02 MED ORDER — FREE WATER
300.0000 mL | Status: DC
  Administered 2020-02-02 – 2020-02-04 (×10): 300 mL

## 2020-02-02 MED ORDER — POTASSIUM CHLORIDE 20 MEQ PO PACK
40.0000 meq | PACK | Freq: Two times a day (BID) | ORAL | Status: DC
  Administered 2020-02-02 (×2): 40 meq
  Filled 2020-02-02 (×2): qty 2

## 2020-02-02 MED ORDER — FAMOTIDINE 20 MG PO TABS
20.0000 mg | ORAL_TABLET | Freq: Every day | ORAL | Status: DC
  Administered 2020-02-02 – 2020-03-10 (×34): 20 mg
  Filled 2020-02-02 (×34): qty 1

## 2020-02-02 NOTE — Progress Notes (Signed)
NAME:  [name], MRN:  [id number], DOB:  [birth year], LOS: 8 ADMISSION DATE:  01/25/2020, CONSULTATION DATE:  01/31/2020 REFERRING MD:  Graciella Freer, CHIEF COMPLAINT:  Worsening AMS/ hypoxia  Brief History   20 yoM admitted 6/11 with small bilateral [name] requiring intermittent cleviprex for strict BP control, with hospitalization complicated by likely aspiration pneumonia, AKI on CKD, and worsening mental status  and desaturation episodes with increased O2 needs.  Mental status prevents BiPAP.  Intubated for hypercarbic respiratory failure  History of present illness   HPI obtained from medical chart review as patient is unresponsive.   74 year old male with prior extensive medical history significant for blindness, prior ICH with previous trach/ PEG with some baseline aphasia, HFpEF, AS s/p TAVR 04/2019, afib not on AC due to GI bleeding and ICH, CAD, OSA, CKD stage IV, HTN, morbid obesity, and poorly controlled DMT2 with neuropathy who presented 6/11 with 3 day history of bifrontal headache, recent falls 2-3 weeks prior, increasing confusion/ syncope and nausea/ vomiting.  Found on workup to have small bilateral intraventricular hemorrhage of unclear etiology, suspected to be traumatic given fall history vs spontaneous.   MRI unable to be obtained given pacemaker.  Admitted to IMTS with neurology consulting.  Required intermittent cleviprex gtt for strict BP control. Hospitalization complicated by suspected aspiration pneumonia on 6/13 and started empirically on antibiotics, and worsening AKI on CKD, and progressive decline in mental status.  Serial imaging mostly stable.  Palliative care consulted to help with goals of care and Overnight, 6/16, he had worsening mental status , increased O2 needs, and desaturations. ABG obtained showed 7.306/ 68/ 72/ 33.5.   PCCM consulted for further management of respiratory failure.   Past Medical History  Blindness, OSA, prior ICH with previous trach/ PEG, HFpEF,  afib, AS s/p TAVR 04/2019, CAD, CKD stage IV, HTN, DMT2 poorly controlled with neuropathy, morbid obesity, HLD, GERD, bradycardia/ LBBB with pacemaker,   Significant Hospital Events   6/11 admitted IMTS to ICU, intermittently requiring cleviprex 6/13 concern for aspiration, abx started 6/15 requiring cleviprex gtt 6/17 worsening mental status/ hypoxia- intubated  Consults:  Neurology 6/11 Palliative care 6/15 PCCM 6/17  Procedures:  6/14 right nare cortrak >> 6/17 ETT >> 6/17 bronchoscopy-> RML mucous plug   Significant Diagnostic Tests:  6/11 CTH >> There is hemorrhage in the dependent portion of each lateral ventricle of uncertain etiology. No brain parenchymal hemorrhage elsewhere noted. No mass. No extra-axial fluid.  There is mild atrophy with extensive periventricular small vessel disease. No acute appearing infarct is evident.  There are multiple foci of arterial vascular calcification. There is mucosal thickening in several ethmoid air cells.  6/11 RUQ limited US >> Gallbladder sludge and gallstones without complicating factors.  Right renal cysts stable from prior CT examination.  6/12 CTH >> unchanged [name] layering within the occipital horns of the lateral ventricles  6/13 CTH >> 1. Increased but still small volume intraventricular hemorrhage layering in the occipital horns of the lateral ventricles. 2. Borderline increase in ventriculomegaly, recommend follow-up.  6/13 TTE >> LVEF 60-65%, mod LVH, no wall motion abnormality, H3ZJ, RV systolic function normal, mild LA dilation, post TAVR, mild dilation of aortic root  6/14 CTH >>  Stable hemorrhage layering in each occipital horn of the lateral ventricles, stable. No hemorrhage appreciable. There is atrophy with extensive supratentorial small vessel disease, stable. No acute infarct evident. There is extensive arterial vascular calcification at multiple sites. There is mucosal thickening in multiple ethmoid air  cells.  6/16 CTH >> Stable intraventricular hemorrhage. Small amount of subarachnoid hemorrhage on the left also stable. No new hemorrhage or infarct Mild ventricular enlargement is stable  6/17 CTH >>  Stable exam with small amount of bilateral intraventricular hemorrhage and subarachnoid overlying L parietal lobe.  No new hemorrhage    6/17 Renal US: no hydronephrosis. Increased cortical echogenicity of bilateral kidneys. Bilateral renal cysts. L cyst arising form inferior pole and complicated of internal areas with increased echogenicity    Micro Data:  6/11 SARS 2 >> neg 6/15 MRSA PCR >> neg 6/15 BCx2 >> ngtd >> 6/17 BAL  >> nml flora  Antimicrobials:  6/13 azithro >> 6/14 6/13 ceftriaxone >> 6/14 6/14 unasyn >> 6/16 6/17 vanc >>6/18 6/17 cefepime >> 6/17 flagyl >>6/18  Interim history/subjective:  Critically ill, intubated Low-grade febrile   Objective   Blood pressure (!) 143/71, pulse 78, temperature 99.1 F (37.3 C), temperature source Axillary, resp. rate (!) 24, height _0  (1.753 m), weight (!) 137.8 kg, SpO2 93 %.    Vent Mode: PSV;CPAP FiO2 (%):  [40 %-60 %] 40 % Set Rate:  [19 bmp] 19 bmp Vt Set:  [560 mL] 560 mL PEEP:  [5 cmH20] 5 cmH20 Pressure Support:  [20 cmH20] 20 cmH20 Plateau Pressure:  [16 cmH20-19 cmH20] 19 cmH20   Intake/Output Summary (Last 24 hours) at 02/02/2020 1150 Last data filed at 02/02/2020 1100 Gross per 24 hour  Intake 1952.18 ml  Output 3150 ml  Net -1197.82 ml   Filed Weights   01/28/20 0339 01/29/20 0438 02/01/20 0500  Weight: (!) 139.4 kg (!) 137 kg (!) 137.8 kg   Examination: General:  Obese chronically and critically ill appearing older adult M, intubated sedated NAD  HEENT: NCAT. Neck with healed tracheostomy scar. ETT   Oral airway Neuro: Off propofol, RASS -2 .appears uncomfortable grimaces to painful stimuli, opens eyes to stimulation. Does not follow commands.  CV: RRR s1s2 no rgm cap refill < 3 seconds  PULM:   Diminished bibasilar sounds.  Bilateral scattered rhonchi.  No accessory muscle use GI: Soft, obese, distant bowel sounds  Extremities: L foot digit amputations. Symmetrical muscle bulk. No cyanosis Skin: c/d/w without rash    Chest x-ray personally reviewed shows bilateral airspace disease in the pattern of pulmonary edema  Labs show worsening hypernatremia, slight decrease in creatinine, stable BUN, stable leukocytosis , platelet clumping   Resolved Hospital Problem list    Assessment & Plan:   Acute encephalopathy, improving -likely multifactorial in setting of hypoxia/hypercarpia,  uremia, infection. -CT H stable. Ammonia mildly elevated, doubt HE  P -Delirium precautions, minimize sedation as able   Acute hypoxic and hypercarbic respiratory failure requiring intubation in setting of: -aspiration PNA, mucous plugging, pulmonary edema. Morbid obesity may be contributing to degree-- wonder if baseline hypercarbia / OHS? -has had trach/peg in past  P:  -Start spontaneous breathing trials   Sepsis, likely aspiration PNA P:  -Continue cefepime for 7 days total , cultures negative  [name] - small, bilateral, stable  suspected etiology falls vs spontaneous  P:  -neuro has signed off -SBP goal < 160  -supportive care   HTN Acute on chronic diastolic HF P:  -continue hydral norvasc metop. PRN labetalol - SBP goal < 160    AKI on CKD stage IV -bilateral renal cysts. L side arising from inferior pole, complicated by internal areas on increased echogenicity which may reflect septations  P:   -Trend UOP  -Diuretics on hold,  on home metolazone, BUN decreasing further daily Bumex - consider non-emergent renal MRI w/wo contrast once acute issues resolve  Hypernatremia -continue free water 200 every 4  DM2, poorly controlled P:  -Levemir 60 units BID, Resistant SSI, Novolog 6units q4 hr enteral coverage  Morbid obesity with BMI 38 -suspect possible degree of OHS  contributing to hypercarbia  Goals of Care: -Full scope of care per palliative care discussion   Best practice:  Diet: EN Pain/Anxiety/Delirium protocol (if indicated): RASS goal 0, prn propofol / fentanyl prn if needed VAP protocol (if indicated): yes DVT prophylaxis: heparin SQ GI prophylaxis: PPI Glucose control: as above Mobility: BR Code Status: Full. Family Communication:   Disposition: ICU    The patient is critically ill with multiple organ systems failure and requires high complexity decision making for assessment and support, frequent evaluation and titration of therapies, application of advanced monitoring technologies and extensive interpretation of multiple databases. Critical Care Time devoted to patient care services described in this note independent of APP/resident  time is 33 minutes.   Kara Mead MD. [place]. [facility]  If no response to pager , please call [phone]   02/02/2020

## 2020-02-03 ENCOUNTER — Inpatient Hospital Stay (HOSPITAL_COMMUNITY): Payer: Medicare (Managed Care)

## 2020-02-03 LAB — BASIC METABOLIC PANEL
Anion gap: 11 (ref 5–15)
BUN: 130 mg/dL — ABNORMAL HIGH (ref 8–23)
CO2: 26 mmol/L (ref 22–32)
Calcium: 9 mg/dL (ref 8.9–10.3)
Chloride: 118 mmol/L — ABNORMAL HIGH (ref 98–111)
Creatinine, Ser: 2.8 mg/dL — ABNORMAL HIGH (ref 0.61–1.24)
GFR calc Af Amer: 25 mL/min — ABNORMAL LOW (ref >60)
GFR calc non Af Amer: 22 mL/min — ABNORMAL LOW (ref >60)
Glucose, Bld: 149 mg/dL — ABNORMAL HIGH (ref 70–99)
Potassium: 5 mmol/L (ref 3.5–5.1)
Sodium: 155 mmol/L — ABNORMAL HIGH (ref 135–145)

## 2020-02-03 LAB — CBC
HCT: 37.5 % — ABNORMAL LOW (ref 39.0–52.0)
Hemoglobin: 10.5 g/dL — ABNORMAL LOW (ref 13.0–17.0)
MCH: 23.9 pg — ABNORMAL LOW (ref 26.0–34.0)
MCHC: 28 g/dL — ABNORMAL LOW (ref 30.0–36.0)
MCV: 85.2 fL (ref 80.0–100.0)
Platelets: 117 10*3/uL — ABNORMAL LOW (ref 150–400)
RBC: 4.4 MIL/uL (ref 4.22–5.81)
RDW: 17.4 % — ABNORMAL HIGH (ref 11.5–15.5)
WBC: 11 10*3/uL — ABNORMAL HIGH (ref 4.0–10.5)
nRBC: 0 % (ref 0.0–0.2)

## 2020-02-03 LAB — CULTURE, BLOOD (ROUTINE X 2)
Culture: NO GROWTH
Culture: NO GROWTH
Special Requests: ADEQUATE
Special Requests: ADEQUATE

## 2020-02-03 LAB — GLUCOSE, CAPILLARY
Glucose-Capillary: 137 mg/dL — ABNORMAL HIGH (ref 70–99)
Glucose-Capillary: 83 mg/dL (ref 70–99)
Glucose-Capillary: 145 mg/dL — ABNORMAL HIGH (ref 70–99)
Glucose-Capillary: 160 mg/dL — ABNORMAL HIGH (ref 70–99)
Glucose-Capillary: 131 mg/dL — ABNORMAL HIGH (ref 70–99)

## 2020-02-03 LAB — MAGNESIUM: Magnesium: 2.9 mg/dL — ABNORMAL HIGH (ref 1.7–2.4)

## 2020-02-03 LAB — PHOSPHORUS: Phosphorus: 3.1 mg/dL (ref 2.5–4.6)

## 2020-02-03 MED ORDER — METOLAZONE 2.5 MG PO TABS
2.5000 mg | ORAL_TABLET | Freq: Every day | ORAL | Status: DC
  Administered 2020-02-03: 2.5 mg via ORAL
  Filled 2020-02-03 (×2): qty 1

## 2020-02-03 MED ORDER — INSULIN ASPART 100 UNIT/ML ~~LOC~~ SOLN
4.0000 [IU] | SUBCUTANEOUS | Status: DC
  Administered 2020-02-03 – 2020-02-14 (×63): 4 [IU] via SUBCUTANEOUS

## 2020-02-03 MED ORDER — SODIUM CHLORIDE 0.9 % IV SOLN
2.0000 g | Freq: Two times a day (BID) | INTRAVENOUS | Status: DC
  Filled 2020-02-03: qty 2

## 2020-02-03 MED ORDER — SODIUM CHLORIDE 0.9 % IV SOLN
2.0000 g | Freq: Two times a day (BID) | INTRAVENOUS | Status: DC
  Administered 2020-02-03 – 2020-02-04 (×2): 2 g via INTRAVENOUS
  Filled 2020-02-03 (×3): qty 2

## 2020-02-03 NOTE — Progress Notes (Signed)
Pharmacy Antibiotic Note  [name] is a 74 y.o. male admitted on 01/25/2020 with pneumonia.  Pharmacy has been consulted for Cefepime dosing.   Day #5 of therapy. Plan is for 7 days per Dr. Bari Mantis progress note on 6/19.  WBC is trending down. SCr is improving. Cultures without growth.   Plan: Increase Cefepime to 2g IV every 12 hours with improving renal function.  Trend WBC, temp, renal function    Height: 5\' 9"  (175.3 cm) (measured x 3) Weight: (!) 140.2 kg (309 lb 1.4 oz) IBW/kg (Calculated) : 70.7  Temp (24hrs), Avg:99.2 F (37.3 C), Min:98.6 F (37 C), Max:99.6 F (37.6 C)  Recent Labs  Lab 01/29/20 0748 01/29/20 1616 01/29/20 1852 01/30/20 0715 01/31/20 0706 02/01/20 1024 02/02/20 0614 02/03/20 0257 02/03/20 0552  WBC   < >  --   --  15.0* 15.0* 15.8* 14.5*  --  11.0*  CREATININE   < > 2.77*  --  3.17* 3.87* 3.48* 2.97* 2.80*  --   LATICACIDVEN  --  1.8 1.7  --   --   --   --   --   --    < > = values in this interval not displayed.    Estimated Creatinine Clearance: 33.7 mL/min (A) (by C-G formula based on SCr of 2.8 mg/dL (H)).    No Known Allergies  [name], PharmD, BCPS, BCCCP Clinical Pharmacist Please refer to [facility] for [place] numbers 02/03/2020, 9:41 AM

## 2020-02-03 NOTE — Plan of Care (Signed)
  Problem: Clinical Measurements: Goal: Ability to maintain clinical measurements within normal limits will improve Outcome: Progressing   

## 2020-02-03 NOTE — Progress Notes (Signed)
NAME:  [name], MRN:  [id number], DOB:  20-Sep-1945, LOS: 9 ADMISSION DATE:  01/25/2020, CONSULTATION DATE:  01/31/2020 REFERRING MD:  Graciella Freer, CHIEF COMPLAINT:  Worsening AMS/ hypoxia  Brief History   74 yoM admitted 6/11 with small bilateral [name] requiring intermittent cleviprex for strict BP control, with hospitalization complicated by likely aspiration pneumonia, AKI on CKD, and worsening mental status  and desaturation episodes with increased O2 needs.  Mental status prevents BiPAP.  Intubated for hypercarbic respiratory failure  History of present illness   HPI obtained from medical chart review as patient is unresponsive.   74 year old male with prior extensive medical history significant for blindness, prior ICH with previous trach/ PEG with some baseline aphasia, HFpEF, AS s/p TAVR 04/2019, afib not on AC due to GI bleeding and ICH, CAD, OSA, CKD stage IV, HTN, morbid obesity, and poorly controlled DMT2 with neuropathy who presented 6/11 with 3 day history of bifrontal headache, recent falls 2-3 weeks prior, increasing confusion/ syncope and nausea/ vomiting.  Found on workup to have small bilateral intraventricular hemorrhage of unclear etiology, suspected to be traumatic given fall history vs spontaneous.   MRI unable to be obtained given pacemaker.  Admitted to IMTS with neurology consulting.  Required intermittent cleviprex gtt for strict BP control. Hospitalization complicated by suspected aspiration pneumonia on 6/13 and started empirically on antibiotics, and worsening AKI on CKD, and progressive decline in mental status.  Serial imaging mostly stable.  Palliative care consulted to help with goals of care and Overnight, 6/16, he had worsening mental status , increased O2 needs, and desaturations. ABG obtained showed 7.306/ 68/ 72/ 33.5.   PCCM consulted for further management of respiratory failure.   Past Medical History  Blindness, OSA, prior ICH with previous trach/ PEG, HFpEF,  afib, AS s/p TAVR 04/2019, CAD, CKD stage IV, HTN, DMT2 poorly controlled with neuropathy, morbid obesity, HLD, GERD, bradycardia/ LBBB with pacemaker,   Significant Hospital Events   6/11 admitted IMTS to ICU, intermittently requiring cleviprex 6/13 concern for aspiration, abx started 6/15 requiring cleviprex gtt 6/17 worsening mental status/ hypoxia- intubated  Consults:  Neurology 6/11 Palliative care 6/15 PCCM 6/17  Procedures:  6/14 right nare cortrak >> 6/17 ETT >> 6/17 bronchoscopy-> RML mucous plug   Significant Diagnostic Tests:  6/11 CTH >> There is hemorrhage in the dependent portion of each lateral ventricle of uncertain etiology. No brain parenchymal hemorrhage elsewhere noted. No mass. No extra-axial fluid.  There is mild atrophy with extensive periventricular small vessel disease. No acute appearing infarct is evident.  There are multiple foci of arterial vascular calcification. There is mucosal thickening in several ethmoid air cells.  6/11 RUQ limited US >> Gallbladder sludge and gallstones without complicating factors.  Right renal cysts stable from prior CT examination.  6/12 CTH >> unchanged [name] layering within the occipital horns of the lateral ventricles  6/13 CTH >> 1. Increased but still small volume intraventricular hemorrhage layering in the occipital horns of the lateral ventricles. 2. Borderline increase in ventriculomegaly, recommend follow-up.  6/13 TTE >> LVEF 60-65%, mod LVH, no wall motion abnormality, W0JW, RV systolic function normal, mild LA dilation, post TAVR, mild dilation of aortic root  6/14 CTH >>  Stable hemorrhage layering in each occipital horn of the lateral ventricles, stable. No hemorrhage appreciable. There is atrophy with extensive supratentorial small vessel disease, stable. No acute infarct evident. There is extensive arterial vascular calcification at multiple sites. There is mucosal thickening in multiple ethmoid air  cells.  6/16 CTH >> Stable intraventricular hemorrhage. Small amount of subarachnoid hemorrhage on the left also stable. No new hemorrhage or infarct Mild ventricular enlargement is stable  6/17 CTH >>  Stable exam with small amount of bilateral intraventricular hemorrhage and subarachnoid overlying L parietal lobe.  No new hemorrhage    6/17 Renal US: no hydronephrosis. Increased cortical echogenicity of bilateral kidneys. Bilateral renal cysts. L cyst arising form inferior pole and complicated of internal areas with increased echogenicity    Micro Data:  6/11 SARS 2 >> neg 6/15 MRSA PCR >> neg 6/15 BCx2 >> ngtd  6/17 BAL  >> nml flora  Antimicrobials:  6/13 azithro >> 6/14 6/13 ceftriaxone >> 6/14 6/14 unasyn >> 6/16 6/17 vanc >>6/18 6/17 cefepime >> 6/17 flagyl >>6/18  Interim history/subjective:  Remains critically ill, intubated Afebrile  Good urine output Weaned on pressure support 12/5 yesterday but not weaning today  Objective   Blood pressure (!) 109/54, pulse 65, temperature 99.4 F (37.4 C), temperature source Axillary, resp. rate (!) 21, height _0  (1.753 m), weight (!) 140.2 kg, SpO2 94 %.    Vent Mode: PRVC FiO2 (%):  [40 %] 40 % Set Rate:  [19 bmp] 19 bmp Vt Set:  [560 mL] 560 mL PEEP:  [5 cmH20] 5 cmH20 Pressure Support:  [12 cmH20-20 cmH20] 12 cmH20 Plateau Pressure:  [19 cmH20-25 cmH20] 20 cmH20   Intake/Output Summary (Last 24 hours) at 02/03/2020 1334 Last data filed at 02/03/2020 1100 Gross per 24 hour  Intake 1938.89 ml  Output 1925 ml  Net 13.89 ml   Filed Weights   01/29/20 0438 02/01/20 0500 02/03/20 0500  Weight: (!) 137 kg (!) 137.8 kg (!) 140.2 kg   Examination: General:  Obese chronically and critically ill appearing 74 older adult M, intubated sedated no obvious distress HEENT: NCAT. Neck with healed tracheostomy scar. ETT   Oral airway Neuro: On Precedex, RASS -1.appears uncomfortable grimaces to painful stimuli, opens eyes to  stimulation. Does not follow commands.  CV: RRR s1s2 nml no murmur PULM:  Diminished bibasilar sounds.  Bilateral scattered rhonchi.  No accessory muscle use GI: Soft, obese, distant bowel sounds  Extremities: L foot digit amputations. Symmetrical muscle bulk. No cyanosis Skin: c/d/w without rash    Chest x-ray personally reviewed shows slight decrease in edema pattern  Labs show worsening hypernatremia, stable high BUN and creatinine, decreasing leukocytosis   Resolved Hospital Problem list    Assessment & Plan:   Acute encephalopathy, improving -likely multifactorial in setting of hypoxia/hypercarpia,  uremia, infection. -CT H stable. Ammonia mildly elevated, doubt HE  P -Delirium precautions -Continue Precedex, goal RASS 0 to -1   Acute hypoxic and hypercarbic respiratory failure requiring intubation in setting of: -aspiration PNA, mucous plugging, pulmonary edema. Morbid obesity may be contributing to degree-- wonder if baseline hypercarbia / OHS? -has had trach/peg in past  P:  -Continue  spontaneous breathing trials on pressure support   Sepsis, likely aspiration PNA P:  -Continue cefepime for 7 days total , cultures negative  [name] - small, bilateral, stable  suspected etiology falls vs spontaneous  P:  -neuro has signed off -SBP goal < 160  -supportive care   HTN Acute on chronic diastolic HF P:  -continue hydral norvasc metop. PRN labetalol - SBP goal < 160    AKI on CKD stage IV -bilateral renal cysts. L side arising from inferior pole, complicated by internal areas on increased echogenicity which may reflect septations  Mild hyperkalemia  P:   -Nonoliguric -Resume home metolazone - consider non-emergent renal MRI w/wo contrast once acute issues resolve  Hypernatremia -continue free water 200 every 4  DM2, poorly controlled P:  -Levemir 60 units BID, Resistant SSI,decrease  Novolog 4units q4 hr enteral coverage  Morbid obesity with BMI  38 -suspect possible degree of OHS contributing to hypercarbia  Goals of Care: -Full scope of care per palliative care discussion   Best practice:  Diet: EN Pain/Anxiety/Delirium protocol (if indicated): RASS goal 0, prn propofol / fentanyl prn if needed VAP protocol (if indicated): yes DVT prophylaxis: heparin SQ GI prophylaxis: PPI Glucose control: as above Mobility: BR Code Status: Full. Family Communication:  Daughter [name] Disposition: ICU   The patient is critically ill with multiple organ systems failure and requires high complexity decision making for assessment and support, frequent evaluation and titration of therapies, application of advanced monitoring technologies and extensive interpretation of multiple databases. Critical Care Time devoted to patient care services described in this note independent of APP/resident  time is 32 minutes.    Kara Mead MD. [facility]. [facility]  If no response to pager , please call [phone]   02/03/2020

## 2020-02-04 ENCOUNTER — Inpatient Hospital Stay (HOSPITAL_COMMUNITY): Payer: Medicare (Managed Care)

## 2020-02-04 DIAGNOSIS — S06369A Traumatic hemorrhage of cerebrum, unspecified, with loss of consciousness of unspecified duration, initial encounter: Secondary | ICD-10-CM

## 2020-02-04 LAB — GLUCOSE, CAPILLARY
Glucose-Capillary: 145 mg/dL — ABNORMAL HIGH (ref 70–99)
Glucose-Capillary: 150 mg/dL — ABNORMAL HIGH (ref 70–99)
Glucose-Capillary: 162 mg/dL — ABNORMAL HIGH (ref 70–99)
Glucose-Capillary: 169 mg/dL — ABNORMAL HIGH (ref 70–99)
Glucose-Capillary: 204 mg/dL — ABNORMAL HIGH (ref 70–99)

## 2020-02-04 LAB — CBC
HCT: 34.5 % — ABNORMAL LOW (ref 39.0–52.0)
Hemoglobin: 9.8 g/dL — ABNORMAL LOW (ref 13.0–17.0)
MCH: 23.7 pg — ABNORMAL LOW (ref 26.0–34.0)
MCHC: 28.4 g/dL — ABNORMAL LOW (ref 30.0–36.0)
MCV: 83.5 fL (ref 80.0–100.0)
Platelets: 106 K/uL — ABNORMAL LOW (ref 150–400)
RBC: 4.13 MIL/uL — ABNORMAL LOW (ref 4.22–5.81)
RDW: 17.1 % — ABNORMAL HIGH (ref 11.5–15.5)
WBC: 9.4 K/uL (ref 4.0–10.5)
nRBC: 0 % (ref 0.0–0.2)

## 2020-02-04 LAB — BASIC METABOLIC PANEL WITH GFR
Anion gap: 10 (ref 5–15)
BUN: 123 mg/dL — ABNORMAL HIGH (ref 8–23)
CO2: 26 mmol/L (ref 22–32)
Calcium: 9.2 mg/dL (ref 8.9–10.3)
Chloride: 121 mmol/L — ABNORMAL HIGH (ref 98–111)
Creatinine, Ser: 2.59 mg/dL — ABNORMAL HIGH (ref 0.61–1.24)
GFR calc Af Amer: 28 mL/min — ABNORMAL LOW
GFR calc non Af Amer: 24 mL/min — ABNORMAL LOW
Glucose, Bld: 146 mg/dL — ABNORMAL HIGH (ref 70–99)
Potassium: 3.9 mmol/L (ref 3.5–5.1)
Sodium: 157 mmol/L — ABNORMAL HIGH (ref 135–145)

## 2020-02-04 LAB — PHOSPHORUS: Phosphorus: 3.4 mg/dL (ref 2.5–4.6)

## 2020-02-04 LAB — MAGNESIUM: Magnesium: 2.9 mg/dL — ABNORMAL HIGH (ref 1.7–2.4)

## 2020-02-04 MED ORDER — METOLAZONE 5 MG PO TABS
5.0000 mg | ORAL_TABLET | Freq: Every day | ORAL | Status: DC
  Administered 2020-02-04 – 2020-02-05 (×2): 5 mg via ORAL
  Filled 2020-02-04 (×4): qty 1

## 2020-02-04 MED ORDER — METOLAZONE 5 MG PO TABS: 5.0000 mg | ORAL_TABLET | Freq: Every day | ORAL | Status: DC

## 2020-02-04 MED ORDER — FREE WATER
400.0000 mL | Status: DC
  Administered 2020-02-04 – 2020-02-06 (×12): 400 mL

## 2020-02-04 MED ORDER — FUROSEMIDE 10 MG/ML IJ SOLN
80.0000 mg | Freq: Once | INTRAMUSCULAR | Status: AC
  Administered 2020-02-04: 80 mg via INTRAVENOUS
  Filled 2020-02-04: qty 8

## 2020-02-04 NOTE — Progress Notes (Signed)
NAME:  [name], MRN:  [id number], DOB:  08-31-1945, LOS: 10 ADMISSION DATE:  01/25/2020, CONSULTATION DATE:  01/31/2020 REFERRING MD:  Graciella Freer, CHIEF COMPLAINT:  Worsening AMS/ hypoxia  Brief History   5 yoM admitted 6/11 with small bilateral [name] requiring intermittent cleviprex for strict BP control, with hospitalization complicated by likely aspiration pneumonia, AKI on CKD, and worsening mental status  and desaturation episodes with increased O2 needs.  Mental status prevents BiPAP.  Intubated for hypercarbic respiratory failure  History of present illness   HPI obtained from medical chart review as patient is unresponsive.   74 year old male with prior extensive medical history significant for blindness, prior ICH with previous trach/ PEG with some baseline aphasia, HFpEF, AS s/p TAVR 04/2019, afib not on AC due to GI bleeding and ICH, CAD, OSA, CKD stage IV, HTN, morbid obesity, and poorly controlled DMT2 with neuropathy who presented 6/11 with 3 day history of bifrontal headache, recent falls 2-3 weeks prior, increasing confusion/ syncope and nausea/ vomiting.  Found on workup to have small bilateral intraventricular hemorrhage of unclear etiology, suspected to be traumatic given fall history vs spontaneous.   MRI unable to be obtained given pacemaker.  Admitted to IMTS with neurology consulting.  Required intermittent cleviprex gtt for strict BP control. Hospitalization complicated by suspected aspiration pneumonia on 6/13 and started empirically on antibiotics, and worsening AKI on CKD, and progressive decline in mental status.  Serial imaging mostly stable.  Palliative care consulted to help with goals of care and Overnight, 6/16, he had worsening mental status , increased O2 needs, and desaturations. ABG obtained showed 7.306/ 68/ 72/ 33.5.   PCCM consulted for further management of respiratory failure.   Past Medical History  Blindness, OSA, prior ICH with previous trach/ PEG, HFpEF,  afib, AS s/p TAVR 04/2019, CAD, CKD stage IV, HTN, DMT2 poorly controlled with neuropathy, morbid obesity, HLD, GERD, bradycardia/ LBBB with pacemaker,   Significant Hospital Events   6/11 admitted IMTS to ICU, intermittently requiring cleviprex 6/13 concern for aspiration, abx started 6/15 requiring cleviprex gtt 6/17 worsening mental status/ hypoxia- intubated  Consults:  Neurology 6/11 Palliative care 6/15 PCCM 6/17  Procedures:  6/14 right nare cortrak >> 6/17 ETT >> 6/17 bronchoscopy-> RML mucous plug   Significant Diagnostic Tests:  6/11 CTH >> There is hemorrhage in the dependent portion of each lateral ventricle of uncertain etiology. No brain parenchymal hemorrhage elsewhere noted. No mass. No extra-axial fluid.  There is mild atrophy with extensive periventricular small vessel disease. No acute appearing infarct is evident.  There are multiple foci of arterial vascular calcification. There is mucosal thickening in several ethmoid air cells.  6/11 RUQ limited US >> Gallbladder sludge and gallstones without complicating factors.  Right renal cysts stable from prior CT examination.  6/12 CTH >> unchanged [name] layering within the occipital horns of the lateral ventricles  6/13 CTH >> 1. Increased but still small volume intraventricular hemorrhage layering in the occipital horns of the lateral ventricles. 2. Borderline increase in ventriculomegaly, recommend follow-up.  6/13 TTE >> LVEF 60-65%, mod LVH, no wall motion abnormality, T3MI, RV systolic function normal, mild LA dilation, post TAVR, mild dilation of aortic root  6/14 CTH >>  Stable hemorrhage layering in each occipital horn of the lateral ventricles, stable. No hemorrhage appreciable. There is atrophy with extensive supratentorial small vessel disease, stable. No acute infarct evident. There is extensive arterial vascular calcification at multiple sites. There is mucosal thickening in multiple ethmoid air  cells.  6/16 CTH >> Stable intraventricular hemorrhage. Small amount of subarachnoid hemorrhage on the left also stable. No new hemorrhage or infarct Mild ventricular enlargement is stable  6/17 CTH >>  Stable exam with small amount of bilateral intraventricular hemorrhage and subarachnoid overlying L parietal lobe.  No new hemorrhage   6/17 Renal US: no hydronephrosis. Increased cortical echogenicity of bilateral kidneys. Bilateral renal cysts. L cyst arising form inferior pole and complicated of internal areas with increased echogenicity   Micro Data:  6/11 SARS 2 >> neg 6/15 MRSA PCR >> neg 6/15 BCx2 >> ngtd >> 6/17 BAL  >> nml flora  Antimicrobials:  6/13 azithro >> 6/14 6/13 ceftriaxone >> 6/14 6/14 unasyn >> 6/16 6/17 vanc >>6/18 6/17 cefepime >> 6/17 flagyl >>6/18  Interim history/subjective:   Intubated, critically ill.  No issues overnight.  Remains sedated on mechanical ventilation  Objective   Blood pressure (!) 150/73, pulse 64, temperature 98.1 F (36.7 C), temperature source Axillary, resp. rate 19, height _0  (1.753 m), weight 133.5 kg, SpO2 94 %.    Vent Mode: PRVC FiO2 (%):  [40 %] 40 % Set Rate:  [19 bmp] 19 bmp Vt Set:  [560 mL] 560 mL PEEP:  [5 cmH20] 5 cmH20 Plateau Pressure:  [18 cmH20-26 cmH20] 26 cmH20   Intake/Output Summary (Last 24 hours) at 02/04/2020 0806 Last data filed at 02/04/2020 0700 Gross per 24 hour  Intake 3105.93 ml  Output 2950 ml  Net 155.93 ml   Filed Weights   02/01/20 0500 02/03/20 0500 02/04/20 0500  Weight: (!) 137.8 kg (!) 140.2 kg 133.5 kg   Examination: General: Morbidly obese, chronically ill-appearing male intubated on mechanical life support HEENT: NCAT, prior tracheostomy scar on anterior midline neck, endotracheal tube in place Neuro: Off sedation does open eyes to pain, has spontaneous movements CV: Regular rate and rhythm, S1-S2 PULM: Diminished breath sounds bilaterally, near absent in the bilateral  bases, no crackles GI: Soft, obese nontender nondistended Extremities: Left forefoot amputation Skin: No significant rash  Chest x-ray 02/04/2020: Bilateral interstitial edema, bilateral pleural effusions. The patient's images have been independently reviewed by me.    Labs: Reviewed Sodium 157, slightly increasing Serum and creatinine improving, 2.59.   Resolved Hospital Problem list    Assessment & Plan:   Acute encephalopathy, improving -likely multifactorial in setting of hypoxia/hypercarpia,  uremia, infection. -CT H stable. Ammonia mildly elevated, doubt HE  P -Delirium precautions -Supportive care -Avoid benzodiazepines  Acute hypoxic and hypercarbic respiratory failure requiring intubation in setting of: -aspiration PNA, mucous plugging, pulmonary edema. Morbid obesity may be contributing to degree-- wonder if baseline hypercarbia / OHS? -has had trach/peg in past  P:  -Continue diuresis -Once negative cumulative fluid balance -Consider SBT SAT  Sepsis, likely aspiration PNA P:  -Cultures negative, white blood cell count normal -Stop antimicrobials and observe for any infectious signs  [name] - small, bilateral, stable  suspected etiology falls vs spontaneous  P:  -Neuro has signed off we appreciate their input -Systolic blood pressure goal less than 160 -Supportive care  HTN Acute on chronic diastolic HF P:  -Continue hydralazine, Norvasc, metoprolol and as needed labetalol  AKI on CKD stage IV -bilateral renal cysts. L side arising from inferior pole, complicated by internal areas on increased echogenicity which may reflect septations  P:  -Needs outpatient renal MRI -Continue to follow urine output and serum creatinine, this is improving  Hypernatremia- -Increased free water to 400 mL every 4 hours  -Metolazone  5 mg daily x3 days  DM2, poorly controlled P:  -Continue Levemir, resistant SSI plus CBGs  Morbid obesity with BMI 38 -Likely has OHS at  baseline.  Goals of Care: -Continuing full aggressive measures per palliative care discussion. We appreciate palliative care input. Agree with full family meeting at some point.   Best practice:  Diet: EN Pain/Anxiety/Delirium protocol (if indicated): RASS goal 0, prn propofol / fentanyl prn if needed VAP protocol (if indicated): yes DVT prophylaxis: heparin SQ GI prophylaxis: PPI Glucose control: as above Mobility: BR Code Status: Full. Family Communication:   Disposition: ICU   This patient is critically ill with multiple organ system failure; which, requires frequent high complexity decision making, assessment, support, evaluation, and titration of therapies. This was completed through the application of advanced monitoring technologies and extensive interpretation of multiple databases. During this encounter critical care time was devoted to patient care services described in this note for 32 minutes.  Garner Nash, DO [facility] 02/04/2020 8:06 AM

## 2020-02-04 NOTE — Progress Notes (Signed)
PT Cancellation Note  Patient Details Name: [name] MRN: [id number] DOB: 1945/10/27   Cancelled Treatment:    Reason Eval/Treat Not Completed: Medical issues which prohibited therapy; spoke with RN who reports on medium dose of Precedex and he gets agitated with little stimulation.  Also reports plans for possible trach/PEG in near future.  Will check back on pt 6/23.   [name] 02/04/2020, 2:19 PM  [name], PT Acute Rehabilitation Services Pager:[phone] Office:[phone] 02/04/2020

## 2020-02-05 LAB — BASIC METABOLIC PANEL
Anion gap: 13 (ref 5–15)
BUN: 130 mg/dL — ABNORMAL HIGH (ref 8–23)
CO2: 24 mmol/L (ref 22–32)
Calcium: 9.2 mg/dL (ref 8.9–10.3)
Chloride: 117 mmol/L — ABNORMAL HIGH (ref 98–111)
Creatinine, Ser: 2.67 mg/dL — ABNORMAL HIGH (ref 0.61–1.24)
GFR calc Af Amer: 27 mL/min — ABNORMAL LOW (ref >60)
GFR calc non Af Amer: 23 mL/min — ABNORMAL LOW (ref >60)
Glucose, Bld: 163 mg/dL — ABNORMAL HIGH (ref 70–99)
Potassium: 3.8 mmol/L (ref 3.5–5.1)
Sodium: 154 mmol/L — ABNORMAL HIGH (ref 135–145)

## 2020-02-05 LAB — GLUCOSE, CAPILLARY
Glucose-Capillary: 132 mg/dL — ABNORMAL HIGH (ref 70–99)
Glucose-Capillary: 147 mg/dL — ABNORMAL HIGH (ref 70–99)
Glucose-Capillary: 201 mg/dL — ABNORMAL HIGH (ref 70–99)
Glucose-Capillary: 160 mg/dL — ABNORMAL HIGH (ref 70–99)
Glucose-Capillary: 148 mg/dL — ABNORMAL HIGH (ref 70–99)
Glucose-Capillary: 176 mg/dL — ABNORMAL HIGH (ref 70–99)

## 2020-02-05 LAB — PHOSPHORUS: Phosphorus: 3.4 mg/dL (ref 2.5–4.6)

## 2020-02-05 MED ORDER — POTASSIUM CHLORIDE 20 MEQ/15ML (10%) PO SOLN
40.0000 meq | Freq: Two times a day (BID) | ORAL | Status: AC
  Administered 2020-02-05 (×2): 40 meq via ORAL
  Filled 2020-02-05 (×2): qty 30

## 2020-02-05 MED ORDER — FUROSEMIDE 10 MG/ML IJ SOLN
80.0000 mg | Freq: Two times a day (BID) | INTRAMUSCULAR | Status: DC
  Administered 2020-02-05 (×2): 80 mg via INTRAVENOUS
  Filled 2020-02-05 (×3): qty 8

## 2020-02-05 NOTE — Progress Notes (Signed)
NAME:  [name], MRN:  [id number], DOB:  [birth year], LOS: 11 ADMISSION DATE:  01/25/2020, CONSULTATION DATE:  01/31/2020 REFERRING MD:  Graciella Freer, CHIEF COMPLAINT:  Worsening AMS/ hypoxia  Brief History   79 yoM admitted 6/11 with small bilateral [name] requiring intermittent cleviprex for strict BP control, with hospitalization complicated by likely aspiration pneumonia, AKI on CKD, and worsening mental status  and desaturation episodes with increased O2 needs.  Mental status prevents BiPAP.  Intubated for hypercarbic respiratory failure  History of present illness   HPI obtained from medical chart review as patient is unresponsive.   74 year old male with prior extensive medical history significant for blindness, prior ICH with previous trach/ PEG with some baseline aphasia, HFpEF, AS s/p TAVR 04/2019, afib not on AC due to GI bleeding and ICH, CAD, OSA, CKD stage IV, HTN, morbid obesity, and poorly controlled DMT2 with neuropathy who presented 6/11 with 3 day history of bifrontal headache, recent falls 2-3 weeks prior, increasing confusion/ syncope and nausea/ vomiting.  Found on workup to have small bilateral intraventricular hemorrhage of unclear etiology, suspected to be traumatic given fall history vs spontaneous.   MRI unable to be obtained given pacemaker.  Admitted to IMTS with neurology consulting.  Required intermittent cleviprex gtt for strict BP control. Hospitalization complicated by suspected aspiration pneumonia on 6/13 and started empirically on antibiotics, and worsening AKI on CKD, and progressive decline in mental status.  Serial imaging mostly stable.  Palliative care consulted to help with goals of care and Overnight, 6/16, he had worsening mental status , increased O2 needs, and desaturations. ABG obtained showed 7.306/ 68/ 72/ 33.5.   PCCM consulted for further management of respiratory failure.   Past Medical History  Blindness, OSA, prior ICH with previous trach/ PEG, HFpEF,  afib, AS s/p TAVR 04/2019, CAD, CKD stage IV, HTN, DMT2 poorly controlled with neuropathy, morbid obesity, HLD, GERD, bradycardia/ LBBB with pacemaker,   Significant Hospital Events   6/11 admitted IMTS to ICU, intermittently requiring cleviprex 6/13 concern for aspiration, abx started 6/15 requiring cleviprex gtt 6/17 worsening mental status/ hypoxia- intubated  Consults:  Neurology 6/11 Palliative care 6/15 PCCM 6/17  Procedures:  6/14 right nare cortrak >> 6/17 ETT >> 6/17 bronchoscopy-> RML mucous plug   Significant Diagnostic Tests:  6/11 CTH >> There is hemorrhage in the dependent portion of each lateral ventricle of uncertain etiology. No brain parenchymal hemorrhage elsewhere noted. No mass. No extra-axial fluid.  There is mild atrophy with extensive periventricular small vessel disease. No acute appearing infarct is evident.  There are multiple foci of arterial vascular calcification. There is mucosal thickening in several ethmoid air cells.  6/11 RUQ limited US >> Gallbladder sludge and gallstones without complicating factors.  Right renal cysts stable from prior CT examination.  6/12 CTH >> unchanged [name] layering within the occipital horns of the lateral ventricles  6/13 CTH >> 1. Increased but still small volume intraventricular hemorrhage layering in the occipital horns of the lateral ventricles. 2. Borderline increase in ventriculomegaly, recommend follow-up.  6/13 TTE >> LVEF 60-65%, mod LVH, no wall motion abnormality, E9HB, RV systolic function normal, mild LA dilation, post TAVR, mild dilation of aortic root  6/14 CTH >>  Stable hemorrhage layering in each occipital horn of the lateral ventricles, stable. No hemorrhage appreciable. There is atrophy with extensive supratentorial small vessel disease, stable. No acute infarct evident. There is extensive arterial vascular calcification at multiple sites. There is mucosal thickening in multiple ethmoid air  cells.  6/16 CTH >> Stable intraventricular hemorrhage. Small amount of subarachnoid hemorrhage on the left also stable. No new hemorrhage or infarct Mild ventricular enlargement is stable  6/17 CTH >>  Stable exam with small amount of bilateral intraventricular hemorrhage and subarachnoid overlying L parietal lobe.  No new hemorrhage   6/17 Renal US: no hydronephrosis. Increased cortical echogenicity of bilateral kidneys. Bilateral renal cysts. L cyst arising form inferior pole and complicated of internal areas with increased echogenicity   Micro Data:  6/11 SARS 2 >> neg 6/15 MRSA PCR >> neg 6/15 BCx2 >> ngtd >> 6/17 BAL  >> nml flora  Antimicrobials:  6/13 azithro >> 6/14 6/13 ceftriaxone >> 6/14 6/14 unasyn >> 6/16 6/17 vanc >>6/18 6/17 cefepime >> 6/17 flagyl >>6/18  Interim history/subjective:   Good uop. No issues overnight   Objective   Blood pressure 138/73, pulse (!) 50, temperature 98 F (36.7 C), temperature source Axillary, resp. rate (!) 21, height _0  (1.753 m), weight 133.5 kg, SpO2 90 %.    Vent Mode: PRVC FiO2 (%):  [40 %] 40 % Set Rate:  [19 bmp] 19 bmp Vt Set:  [560 mL] 560 mL PEEP:  [5 cmH20] 5 cmH20 Plateau Pressure:  [16 cmH20-24 cmH20] 24 cmH20   Intake/Output Summary (Last 24 hours) at 02/05/2020 2703 Last data filed at 02/05/2020 0700 Gross per 24 hour  Intake 2614.63 ml  Output 2300 ml  Net 314.63 ml   Filed Weights   02/01/20 0500 02/03/20 0500 02/04/20 0500  Weight: (!) 137.8 kg (!) 140.2 kg 133.5 kg   Examination: General: morbidly obese male, intubated on mech vent  HEENT: NCAT, anterior neck prior trach scar  Neuro: off sedation, not following commands  CV: RRR, s1 s2 PULM: dimnished breaths sounds bilaterally  GI: soft, obese, nt nd  Extremities: left forefoot amputation  Skin: no rash   Chest x-ray 02/04/2020: Bilateral interstitial edema, bilateral pleural effusions. The patient's images have been independently reviewed  by me.    Labs: Reviewed, sodium elevate Repeat labs pending    Resolved Hospital Problem list    Assessment & Plan:   Acute encephalopathy, improving -likely multifactorial in setting of hypoxia/hypercarpia,  uremia, infection. -CT H stable. Ammonia mildly elevated, doubt HE  P -delirium precautions  - PAD guidelines  - avoid benzos   Acute hypoxic and hypercarbic respiratory failure requiring intubation in setting of: -aspiration PNA, mucous plugging, pulmonary edema. Morbid obesity may be contributing to degree-- baseline hypercarbia / OHS -has had trach/peg in past  P:  -Diuresis again today - check BMP and lytes  - cxr in AM   Sepsis, likely aspiration PNA P:  - Cx neg  - off antibiotics - continue to observe for signs of infection   [name] - small, bilateral, stable  suspected etiology falls vs spontaneous  P:  -appreciate neuro recs  - goal BP <160   HTN Acute on chronic diastolic HF P:  - Continue hydralazine, norvasc, metoprolol, and labetalol   AKI on CKD stage IV -bilateral renal cysts. L side arising from inferior pole, complicated by internal areas on increased echogenicity which may reflect septations  P:  -op MRI at somepoint  - following Scr  - repeat BMP - Continue to diurese   Hypernatremia- - free water continued  - repeat bmp  - diuresis with metolazone and lasix continued   DM2, poorly controlled P:  -levemir, ssi and cbgs  - goal CBG 140-180  Morbid obesity with  BMI 38 - likely baseline OHS   Goals of Care: - agree with full family meeting and palliative care at somepoint    Best practice:  Diet: EN Pain/Anxiety/Delirium protocol (if indicated): RASS goal 0, prn propofol / fentanyl prn if needed VAP protocol (if indicated): yes DVT prophylaxis: heparin SQ GI prophylaxis: PPI Glucose control: as above Mobility: BR Code Status: Full. Family Communication:   Disposition: ICU   This patient is critically ill with multiple  organ system failure; which, requires frequent high complexity decision making, assessment, support, evaluation, and titration of therapies. This was completed through the application of advanced monitoring technologies and extensive interpretation of multiple databases. During this encounter critical care time was devoted to patient care services described in this note for 32 minutes.  Garner Nash, DO [facility] 02/05/2020 8:26 AM

## 2020-02-05 NOTE — Progress Notes (Signed)
Palliative:  HPI: 74 y.o. male  with past medical history of stroke requiring trach/PEG previously, HFpEF, atrial fibrillation, severe aortic stenosis s/p TAVR, CKD stage IV, hypertension, OHS/OSA uses CPAP, diabetes, s/p left foot amputation admitted on 01/25/2020 with ongoing headache and CT head bilateral intraventricular hemorrhage. Hospitalization complicated by ongoing poor mental status, aspiration pneumonia, hypertension, and concern for airway protection. Required intubation 01/31/20.   I had a discussion today with daughter, [name]. We discussed her father's current status remaining on ventilator. We reviewed brain scans and areas of brain effected as well as generalized atrophy and microvascular ischemia. I expressed my concern that the problem that led to intubation is likely irreversible which was airway protection and unable to mange his own secretions/saliva that led to respiratory decline. The severity of this issue is not expected to improve. I feel this is so severe do to multiple insults to brain over the years and chronic changes. I explained to [name] that this is why we are concerned that he will not be able to come off of ventilator as he did previously.   [name] had other questions that I answered to the best of my ability. [name] shares that she is unsure if she would want to pursue tracheostomy given our conversation above. She would like to process and consider this information and we will continue our discussions tomorrow. She was appreciative of the time and explanations today.   All questions/concerns addressed. Emotional support provided.   Exam: Alert (eyes open but not tracking) but does not follow any commands. Intubated and not weaning well. Abd soft - BM today.   Plan: - Ongoing [place] conversations.  - Daughter reconsidering future tracheostomy.   [place], NP Palliative Medicine Team Pager [phone] (Please see [email or web address] for schedule) Team Phone  [phone]    Greater than 50%  of this time was spent counseling and coordinating care related to the above assessment and plan

## 2020-02-06 ENCOUNTER — Inpatient Hospital Stay (HOSPITAL_COMMUNITY): Payer: Medicare (Managed Care)

## 2020-02-06 LAB — GLUCOSE, CAPILLARY
Glucose-Capillary: 112 mg/dL — ABNORMAL HIGH (ref 70–99)
Glucose-Capillary: 113 mg/dL — ABNORMAL HIGH (ref 70–99)
Glucose-Capillary: 119 mg/dL — ABNORMAL HIGH (ref 70–99)
Glucose-Capillary: 139 mg/dL — ABNORMAL HIGH (ref 70–99)
Glucose-Capillary: 160 mg/dL — ABNORMAL HIGH (ref 70–99)
Glucose-Capillary: 161 mg/dL — ABNORMAL HIGH (ref 70–99)
Glucose-Capillary: 162 mg/dL — ABNORMAL HIGH (ref 70–99)
Glucose-Capillary: 167 mg/dL — ABNORMAL HIGH (ref 70–99)

## 2020-02-06 LAB — CBC
HCT: 38.1 % — ABNORMAL LOW (ref 39.0–52.0)
Hemoglobin: 10.7 g/dL — ABNORMAL LOW (ref 13.0–17.0)
MCH: 23.8 pg — ABNORMAL LOW (ref 26.0–34.0)
MCHC: 28.1 g/dL — ABNORMAL LOW (ref 30.0–36.0)
MCV: 84.7 fL (ref 80.0–100.0)
Platelets: 119 10*3/uL — ABNORMAL LOW (ref 150–400)
RBC: 4.5 MIL/uL (ref 4.22–5.81)
RDW: 17.6 % — ABNORMAL HIGH (ref 11.5–15.5)
WBC: 12.1 10*3/uL — ABNORMAL HIGH (ref 4.0–10.5)
nRBC: 0.4 % — ABNORMAL HIGH (ref 0.0–0.2)

## 2020-02-06 LAB — BASIC METABOLIC PANEL
Anion gap: 12 (ref 5–15)
Anion gap: 14 (ref 5–15)
BUN: 134 mg/dL — ABNORMAL HIGH (ref 8–23)
BUN: 137 mg/dL — ABNORMAL HIGH (ref 8–23)
CO2: 28 mmol/L (ref 22–32)
CO2: 26 mmol/L (ref 22–32)
Calcium: 9.5 mg/dL (ref 8.9–10.3)
Calcium: 9.6 mg/dL (ref 8.9–10.3)
Chloride: 116 mmol/L — ABNORMAL HIGH (ref 98–111)
Chloride: 113 mmol/L — ABNORMAL HIGH (ref 98–111)
Creatinine, Ser: 2.59 mg/dL — ABNORMAL HIGH (ref 0.61–1.24)
Creatinine, Ser: 2.52 mg/dL — ABNORMAL HIGH (ref 0.61–1.24)
GFR calc Af Amer: 28 mL/min — ABNORMAL LOW (ref >60)
GFR calc Af Amer: 29 mL/min — ABNORMAL LOW (ref >60)
GFR calc non Af Amer: 24 mL/min — ABNORMAL LOW (ref >60)
GFR calc non Af Amer: 25 mL/min — ABNORMAL LOW (ref >60)
Glucose, Bld: 100 mg/dL — ABNORMAL HIGH (ref 70–99)
Glucose, Bld: 161 mg/dL — ABNORMAL HIGH (ref 70–99)
Potassium: 4.2 mmol/L (ref 3.5–5.1)
Potassium: 3.9 mmol/L (ref 3.5–5.1)
Sodium: 156 mmol/L — ABNORMAL HIGH (ref 135–145)
Sodium: 153 mmol/L — ABNORMAL HIGH (ref 135–145)

## 2020-02-06 MED ORDER — FUROSEMIDE 10 MG/ML IJ SOLN
80.0000 mg | Freq: Four times a day (QID) | INTRAMUSCULAR | Status: AC
  Administered 2020-02-06 (×3): 80 mg via INTRAVENOUS
  Filled 2020-02-06 (×2): qty 8

## 2020-02-06 MED ORDER — FREE WATER
300.0000 mL | Status: DC
  Administered 2020-02-06 – 2020-02-07 (×13): 300 mL

## 2020-02-06 MED ORDER — FUROSEMIDE 10 MG/ML IJ SOLN: 80.0000 mg | Freq: Two times a day (BID) | INTRAMUSCULAR | Status: DC

## 2020-02-06 MED ORDER — METOLAZONE 5 MG PO TABS
10.0000 mg | ORAL_TABLET | Freq: Every day | ORAL | Status: DC
  Administered 2020-02-06: 10 mg
  Filled 2020-02-06: qty 2
  Filled 2020-02-06: qty 1
  Filled 2020-02-06: qty 2

## 2020-02-06 NOTE — Progress Notes (Signed)
OT Cancellation Note  Patient Details Name: [name] MRN: [id number] DOB: 15-Feb-1946   Cancelled Treatment:    Reason Eval/Treat Not Completed: Medical issues which prohibited therapy. Chat text from PT that RN would like for [place] to hold today due to pt with increased agitation and his sedation was increased due to this.  [place], OTR/L Acute Rehab Services Pager [phone] Office [phone]     Almon Register 02/06/2020, 2:09 PM

## 2020-02-06 NOTE — Progress Notes (Signed)
eLink Physician-Brief Progress Note Patient Name: [name] DOB: [birth year] MRN: [id number]   Date of Service  02/06/2020  HPI/Events of Note  Bedside requesting a BMP at midnight to follow up patients serum sodium.  eICU Interventions  BMP ordered for midnight.        [name] 02/06/2020, 9:04 PM

## 2020-02-06 NOTE — Progress Notes (Signed)
Pt placed on pressure support + precedex turned off by MD Icard at bedside at 0852.  Pt now currently breathing 45 times a minute. Precedex turned back on to 0.4 per MD Icard. MD at bedside. Placed back on full support. Will continue to monitor.

## 2020-02-06 NOTE — Progress Notes (Signed)
PT Cancellation Note  Patient Details Name: [name] MRN: [id number] DOB: August 10, 1946   Cancelled Treatment:    Reason Eval/Treat Not Completed: Medical issues which prohibited therapy. Per discussion with RN pt requiring increased sedation, becoming agitated with attempts to wean. PT will hold at this time and follow up when pt is more alert and better able to participate in skilled PT intervention.   Zenaida Niece 02/06/2020, 2:14 PM

## 2020-02-06 NOTE — Plan of Care (Signed)
  Problem: Clinical Measurements: Goal: Will remain free from infection Outcome: Progressing Goal: Cardiovascular complication will be avoided Outcome: Progressing   Problem: Nutrition: Goal: Adequate nutrition will be maintained Outcome: Progressing   Problem: Elimination: Goal: Will not experience complications related to urinary retention Outcome: Progressing   Problem: Pain Managment: Goal: General experience of comfort will improve Outcome: Progressing   Problem: Safety: Goal: Ability to remain free from injury will improve Outcome: Progressing   Problem: Skin Integrity: Goal: Risk for impaired skin integrity will decrease Outcome: Progressing   Problem: Health Behavior/Discharge Planning: Goal: Ability to manage health-related needs will improve Outcome: Not Progressing   Problem: Activity: Goal: Risk for activity intolerance will decrease Outcome: Not Progressing   Problem: Coping: Goal: Level of anxiety will decrease Outcome: Not Progressing

## 2020-02-06 NOTE — Progress Notes (Signed)
NAME:  [name], MRN:  [id number], DOB:  April 17, 1946, LOS: 12 ADMISSION DATE:  01/25/2020, CONSULTATION DATE:  01/31/2020 REFERRING MD:  Graciella Freer, CHIEF COMPLAINT:  Worsening AMS/ hypoxia  Brief History   9 yoM admitted 6/11 with small bilateral [name] requiring intermittent cleviprex for strict BP control, with hospitalization complicated by likely aspiration pneumonia, AKI on CKD, and worsening mental status  and desaturation episodes with increased O2 needs.  Mental status prevents BiPAP.  Intubated for hypercarbic respiratory failure  History of present illness   HPI obtained from medical chart review as patient is unresponsive.   74 year old male with prior extensive medical history significant for blindness, prior ICH with previous trach/ PEG with some baseline aphasia, HFpEF, AS s/p TAVR 04/2019, afib not on AC due to GI bleeding and ICH, CAD, OSA, CKD stage IV, HTN, morbid obesity, and poorly controlled DMT2 with neuropathy who presented 6/11 with 3 day history of bifrontal headache, recent falls 2-3 weeks prior, increasing confusion/ syncope and nausea/ vomiting.  Found on workup to have small bilateral intraventricular hemorrhage of unclear etiology, suspected to be traumatic given fall history vs spontaneous.   MRI unable to be obtained given pacemaker.  Admitted to IMTS with neurology consulting.  Required intermittent cleviprex gtt for strict BP control. Hospitalization complicated by suspected aspiration pneumonia on 6/13 and started empirically on antibiotics, and worsening AKI on CKD, and progressive decline in mental status.  Serial imaging mostly stable.  Palliative care consulted to help with goals of care and Overnight, 6/16, he had worsening mental status , increased O2 needs, and desaturations. ABG obtained showed 7.306/ 68/ 72/ 33.5.   PCCM consulted for further management of respiratory failure.   Past Medical History  Blindness, OSA, prior ICH with previous trach/ PEG, HFpEF,  afib, AS s/p TAVR 04/2019, CAD, CKD stage IV, HTN, DMT2 poorly controlled with neuropathy, morbid obesity, HLD, GERD, bradycardia/ LBBB with pacemaker,   Significant Hospital Events   6/11 admitted IMTS to ICU, intermittently requiring cleviprex 6/13 concern for aspiration, abx started 6/15 requiring cleviprex gtt 6/17 worsening mental status/ hypoxia- intubated  Consults:  Neurology 6/11 Palliative care 6/15 PCCM 6/17  Procedures:  6/14 right nare cortrak >> 6/17 ETT >> 6/17 bronchoscopy-> RML mucous plug   Significant Diagnostic Tests:  6/11 CTH >> There is hemorrhage in the dependent portion of each lateral ventricle of uncertain etiology. No brain parenchymal hemorrhage elsewhere noted. No mass. No extra-axial fluid.  There is mild atrophy with extensive periventricular small vessel disease. No acute appearing infarct is evident.  There are multiple foci of arterial vascular calcification. There is mucosal thickening in several ethmoid air cells.  6/11 RUQ limited US >> Gallbladder sludge and gallstones without complicating factors.  Right renal cysts stable from prior CT examination.  6/12 CTH >> unchanged [name] layering within the occipital horns of the lateral ventricles  6/13 CTH >> 1. Increased but still small volume intraventricular hemorrhage layering in the occipital horns of the lateral ventricles. 2. Borderline increase in ventriculomegaly, recommend follow-up.  6/13 TTE >> LVEF 60-65%, mod LVH, no wall motion abnormality, W3SL, RV systolic function normal, mild LA dilation, post TAVR, mild dilation of aortic root  6/14 CTH >>  Stable hemorrhage layering in each occipital horn of the lateral ventricles, stable. No hemorrhage appreciable. There is atrophy with extensive supratentorial small vessel disease, stable. No acute infarct evident. There is extensive arterial vascular calcification at multiple sites. There is mucosal thickening in multiple ethmoid air  cells.  6/16 CTH >> Stable intraventricular hemorrhage. Small amount of subarachnoid hemorrhage on the left also stable. No new hemorrhage or infarct Mild ventricular enlargement is stable  6/17 CTH >>  Stable exam with small amount of bilateral intraventricular hemorrhage and subarachnoid overlying L parietal lobe.  No new hemorrhage   6/17 Renal US: no hydronephrosis. Increased cortical echogenicity of bilateral kidneys. Bilateral renal cysts. L cyst arising form inferior pole and complicated of internal areas with increased echogenicity   6/23: CXR BL congestion   Micro Data:  6/11 SARS 2 >> neg 6/15 MRSA PCR >> neg 6/15 BCx2 >> ngtd >> 6/17 BAL  >> nml flora  Antimicrobials:  6/13 azithro >> 6/14 6/13 ceftriaxone >> 6/14 6/14 unasyn >> 6/16 6/17 vanc >>6/18 6/17 cefepime >> 6/17 flagyl >>6/18  Interim history/subjective:   Critically ill, intubated on mechanical life support   Objective   Blood pressure (!) 168/79, pulse 87, temperature 97.8 F (36.6 C), temperature source Axillary, resp. rate (!) 33, height _0  (1.753 m), weight 135 kg, SpO2 98 %.    Vent Mode: PRVC FiO2 (%):  [40 %] 40 % Set Rate:  [19 bmp] 19 bmp Vt Set:  [560 mL] 560 mL PEEP:  [5 cmH20] 5 cmH20 Plateau Pressure:  [18 cmH20-24 cmH20] 19 cmH20   Intake/Output Summary (Last 24 hours) at 02/06/2020 0901 Last data filed at 02/06/2020 0700 Gross per 24 hour  Intake 3511.84 ml  Output 2576 ml  Net 935.84 ml   Filed Weights   02/03/20 0500 02/04/20 0500 02/06/20 0500  Weight: (!) 140.2 kg 133.5 kg 135 kg    Examination: General: morbidly obese male, intubated on vent  HEENT: NCAT, tracking, prior trach site well healed  Neuro: alert to voice, trouble following commands  CV: RRR, s1 s2  PULM: diminished BL in bases GI: soft, nt nd  Extremities: left forefoot amputation  Skin: no rush   Chest x-ray 02/06/2020: BL congestion, pulmonary edema   Labs: K stable, Sodium elevated, Scr  decreasing   Resolved Hospital Problem list    Assessment & Plan:   Acute encephalopathy, improving -likely multifactorial in setting of hypoxia/hypercarpia,  uremia, infection. -CT H stable. Ammonia mildly elevated, doubt HE  P - PAD guidelines for sedation  - avoid benzos - continue precedex   Acute hypoxic and hypercarbic respiratory failure requiring intubation in setting of: -aspiration PNA, mucous plugging, pulmonary edema. Morbid obesity may be contributing to degree-- baseline hypercarbia / OHS -has had trach/peg in past  P:  - diuresis again today  - CXR still with vascular congestion. The patient's images have been independently reviewed by me.    Sepsis, likely aspiration PNA P:  - off abx  - continue to observe   [name] - small, bilateral, stable  suspected etiology falls vs spontaneous  P:  - supportive care - appreciate neuro recs   HTN Acute on chronic diastolic HF P:  - continue hydralazine, norvasc, metoprolol  - prn labetalol   AKI on CKD stage IV -bilateral renal cysts. L side arising from inferior pole, complicated by internal areas on increased echogenicity which may reflect septations  P:  -op MRI at somepoint  - follow BMP and UOP   Hypernatremia- - additional diuresis  - metolazone and lasix again today  - increased FWF   DM2, poorly controlled P:  -levemir, ssi, cbgs  - goal 140-180  Morbid obesity with BMI 38 - likely baseline OHS  Goals of Care: -  on going discussions - family would like full measures at this point    Best practice:  Diet: TF  Pain/Anxiety/Delirium protocol (if indicated): RASS goal 0, prn propofol / fentanyl prn if needed VAP protocol (if indicated): yes DVT prophylaxis: heparin SQ GI prophylaxis: PPI Glucose control: as above Mobility: BR Code Status: Full. Family Communication:   Disposition: ICU   This patient is critically ill with multiple organ system failure; which, requires frequent high  complexity decision making, assessment, support, evaluation, and titration of therapies. This was completed through the application of advanced monitoring technologies and extensive interpretation of multiple databases. During this encounter critical care time was devoted to patient care services described in this note for 32 minutes.  [facility] 02/06/2020 9:01 AM

## 2020-02-07 LAB — CBC
HCT: 38.9 % — ABNORMAL LOW (ref 39.0–52.0)
Hemoglobin: 11 g/dL — ABNORMAL LOW (ref 13.0–17.0)
MCH: 23.5 pg — ABNORMAL LOW (ref 26.0–34.0)
MCHC: 28.3 g/dL — ABNORMAL LOW (ref 30.0–36.0)
MCV: 83.1 fL (ref 80.0–100.0)
Platelets: 117 10*3/uL — ABNORMAL LOW (ref 150–400)
RBC: 4.68 MIL/uL (ref 4.22–5.81)
RDW: 17.7 % — ABNORMAL HIGH (ref 11.5–15.5)
WBC: 12 10*3/uL — ABNORMAL HIGH (ref 4.0–10.5)
nRBC: 0.3 % — ABNORMAL HIGH (ref 0.0–0.2)

## 2020-02-07 LAB — BASIC METABOLIC PANEL
Anion gap: 14 (ref 5–15)
Anion gap: 13 (ref 5–15)
BUN: 141 mg/dL — ABNORMAL HIGH (ref 8–23)
BUN: 136 mg/dL — ABNORMAL HIGH (ref 8–23)
CO2: 26 mmol/L (ref 22–32)
CO2: 27 mmol/L (ref 22–32)
Calcium: 9.6 mg/dL (ref 8.9–10.3)
Calcium: 9.3 mg/dL (ref 8.9–10.3)
Chloride: 112 mmol/L — ABNORMAL HIGH (ref 98–111)
Chloride: 110 mmol/L (ref 98–111)
Creatinine, Ser: 2.54 mg/dL — ABNORMAL HIGH (ref 0.61–1.24)
Creatinine, Ser: 2.61 mg/dL — ABNORMAL HIGH (ref 0.61–1.24)
GFR calc Af Amer: 28 mL/min — ABNORMAL LOW (ref >60)
GFR calc Af Amer: 27 mL/min — ABNORMAL LOW (ref >60)
GFR calc non Af Amer: 24 mL/min — ABNORMAL LOW (ref >60)
GFR calc non Af Amer: 24 mL/min — ABNORMAL LOW (ref >60)
Glucose, Bld: 182 mg/dL — ABNORMAL HIGH (ref 70–99)
Glucose, Bld: 157 mg/dL — ABNORMAL HIGH (ref 70–99)
Potassium: 3.8 mmol/L (ref 3.5–5.1)
Potassium: 3.4 mmol/L — ABNORMAL LOW (ref 3.5–5.1)
Sodium: 152 mmol/L — ABNORMAL HIGH (ref 135–145)
Sodium: 150 mmol/L — ABNORMAL HIGH (ref 135–145)

## 2020-02-07 LAB — GLUCOSE, CAPILLARY
Glucose-Capillary: 136 mg/dL — ABNORMAL HIGH (ref 70–99)
Glucose-Capillary: 142 mg/dL — ABNORMAL HIGH (ref 70–99)
Glucose-Capillary: 148 mg/dL — ABNORMAL HIGH (ref 70–99)
Glucose-Capillary: 112 mg/dL — ABNORMAL HIGH (ref 70–99)
Glucose-Capillary: 99 mg/dL (ref 70–99)

## 2020-02-07 MED ORDER — FREE WATER
400.0000 mL | Status: DC
  Administered 2020-02-07 – 2020-02-09 (×24): 400 mL

## 2020-02-07 MED ORDER — ORAL CARE MOUTH RINSE
15.0000 mL | Freq: Two times a day (BID) | OROMUCOSAL | Status: DC
  Administered 2020-02-08 – 2020-03-10 (×59): 15 mL via OROMUCOSAL

## 2020-02-07 MED ORDER — CHLORHEXIDINE GLUCONATE 0.12 % MT SOLN
15.0000 mL | Freq: Two times a day (BID) | OROMUCOSAL | Status: DC
  Administered 2020-02-07 – 2020-03-10 (×62): 15 mL via OROMUCOSAL
  Filled 2020-02-07 (×48): qty 15

## 2020-02-07 NOTE — Progress Notes (Signed)
Physical Therapy Treatment Patient Details Name: [name] MRN: [id number] DOB: Dec 25, 1945 Today's Date: 02/07/2020    History of Present Illness Pt is a 74 y.o. male with PMH of HTN, PAF, SSS, CHF, tachycardia, Heart failure, cerebral artery occlusion with cerebral infarction, OSA, chronic obstructive lung disease, pulmonary edema, DM, type II, CVA, DJD, stage 3 chronic kidney disease, AKI, Gout, depression, blindness, coarse tremors, CAD, L mid foot amputation, amd pacemaker. Pt sent to ED from outpatient imaging when pt had syncope episode, urinated on self, and became confused. CT reveals stable hemorrhage layering in each occipital horn of the lateral ventricles, stable. Pt intubated on 6/17 and extubated on 6/24.    PT Comments    Pt limited by lethargy during session, RN recently turning precedex off at start of session. Pt does become more alert as session progresses but remains inconsistently able to follow commands. Pt unable to follow commands with LEs this session, and requires totalA x2 for any functional mobility tasks. Pt grimacing with PROM of bilateral knees and hips, especially with knee flexion. Pt will benefit from continued acute PT services to further assess mobility when the pt becomes more alert, and to aide in reducing caregiver burden. PT goals and POC updated according to the pt's current functional status.  Follow Up Recommendations  SNF;Supervision/Assistance - 24 hour     Equipment Recommendations  Hospital bed;Wheelchair (measurements PT);Wheelchair cushion (measurements PT) (bariatric wheelchair, mechanical lift)    Recommendations for Other Services       Precautions / Restrictions Precautions Precautions: Fall Precaution Comments: monitor SpO2 Restrictions Weight Bearing Restrictions: No    Mobility  Bed Mobility Overal bed mobility: Needs Assistance Bed Mobility:  (long sitting)           General bed mobility comments: pt assisted into  bed in chair mode, PT/OT assist in pulling pt into long sitting position with totalA x2  Transfers                    Ambulation/Gait                 Stairs             Wheelchair Mobility    Modified Rankin (Stroke Patients Only) Modified Rankin (Stroke Patients Only) Pre-Morbid Rankin Score: Moderately severe disability Modified Rankin: Severe disability     Balance Overall balance assessment: Needs assistance Sitting-balance support: Feet supported;Bilateral upper extremity supported Sitting balance-Leahy Scale: Zero Sitting balance - Comments: totalA x2 to maintain long sitting in bed in chair position Postural control: Posterior lean                                  Cognition Arousal/Alertness: Lethargic (RN turning off precedex at start of session) Behavior During Therapy: Flat affect Overall Cognitive Status: Impaired/Different from baseline Area of Impairment: Attention;Memory;Following commands;Safety/judgement;Awareness;Problem solving (difficult to assess orientation 2/2 lethargy)                   Current Attention Level: Focused Memory: Decreased recall of precautions;Decreased short-term memory Following Commands: Follows one step commands inconsistently Safety/Judgement: Decreased awareness of safety;Decreased awareness of deficits Awareness: Intellectual Problem Solving: Slow processing;Requires verbal cues;Requires tactile cues;Decreased initiation        Exercises Other Exercises Other Exercises: PROM BLE joints 5 repetitions, grimacing with knee/hip ROM    General Comments General comments (skin integrity, edema, etc.): VSS during session, pt  on 6L [place]. Pt demonstrates no purposeful movement of LE's and is unable to follow commands with LEs at this time, does demonstrate some automatic movement of LEs to tactile stimulation once during session. Pt with some automatic movement of BUEs against gravity but  remains inconsistent with command following      Pertinent Vitals/Pain Pain Assessment: Faces Faces Pain Scale: Hurts whole lot Pain Location: R knee with mobility Pain Descriptors / Indicators: Grimacing Pain Intervention(s): Monitored during session    Home Living                      Prior Function            PT Goals (current goals can now be found in the care plan section) Acute Rehab PT Goals Patient Stated Goal: unable PT Goal Formulation: Patient unable to participate in goal setting Time For Goal Achievement: 02/21/20 Potential to Achieve Goals: Fair Progress towards PT goals: Not progressing toward goals - comment (limited ability to follow commands, lethargy)    Frequency    Min 3X/week      PT Plan Current plan remains appropriate    Co-evaluation PT/OT/SLP Co-Evaluation/Treatment: Yes Reason for Co-Treatment: Complexity of the patient's impairments (multi-system involvement);For patient/therapist safety PT goals addressed during session: Mobility/safety with mobility;Strengthening/ROM;Balance        AM-PAC PT "6 Clicks" Mobility   Outcome Measure  Help needed turning from your back to your side while in a flat bed without using bedrails?: Total Help needed moving from lying on your back to sitting on the side of a flat bed without using bedrails?: Total Help needed moving to and from a bed to a chair (including a wheelchair)?: Total Help needed standing up from a chair using your arms (e.g., wheelchair or bedside chair)?: Total Help needed to walk in hospital room?: Total Help needed climbing 3-5 steps with a railing? : Total 6 Click Score: 6    End of Session Equipment Utilized During Treatment: Oxygen Activity Tolerance: Patient limited by lethargy Patient left: in bed;with call bell/phone within reach;with bed alarm set;with nursing/sitter in room Nurse Communication: Mobility status;Need for lift equipment PT Visit Diagnosis: Other  symptoms and signs involving the nervous system (Z61.096)     Time: 0454-0981 PT Time Calculation (min) (ACUTE ONLY): 38 min  Charges:  1 Re-eval                     Zenaida Niece, PT, DPT Acute Rehabilitation Pager: [phone]    Zenaida Niece 02/07/2020, 12:41 PM

## 2020-02-07 NOTE — Progress Notes (Signed)
NAME:  [name], MRN:  [id number], DOB:  1946/03/08, LOS: 47 ADMISSION DATE:  01/25/2020, CONSULTATION DATE:  01/31/2020 REFERRING MD:  Graciella Freer, CHIEF COMPLAINT:  Worsening AMS/ hypoxia  Brief History   60 yoM admitted 6/11 with small bilateral [name] requiring intermittent cleviprex for strict BP control, with hospitalization complicated by likely aspiration pneumonia, AKI on CKD, and worsening mental status  and desaturation episodes with increased O2 needs.  Mental status prevents BiPAP.  Intubated for hypercarbic respiratory failure  History of present illness   HPI obtained from medical chart review as patient is unresponsive.   74 year old male with prior extensive medical history significant for blindness, prior ICH with previous trach/ PEG with some baseline aphasia, HFpEF, AS s/p TAVR 04/2019, afib not on AC due to GI bleeding and ICH, CAD, OSA, CKD stage IV, HTN, morbid obesity, and poorly controlled DMT2 with neuropathy who presented 6/11 with 3 day history of bifrontal headache, recent falls 2-3 weeks prior, increasing confusion/ syncope and nausea/ vomiting.  Found on workup to have small bilateral intraventricular hemorrhage of unclear etiology, suspected to be traumatic given fall history vs spontaneous.   MRI unable to be obtained given pacemaker.  Admitted to IMTS with neurology consulting.  Required intermittent cleviprex gtt for strict BP control. Hospitalization complicated by suspected aspiration pneumonia on 6/13 and started empirically on antibiotics, and worsening AKI on CKD, and progressive decline in mental status.  Serial imaging mostly stable.  Palliative care consulted to help with goals of care and Overnight, 6/16, he had worsening mental status , increased O2 needs, and desaturations. ABG obtained showed 7.306/ 68/ 72/ 33.5.   PCCM consulted for further management of respiratory failure.   Past Medical History  Blindness, OSA, prior ICH with previous trach/ PEG, HFpEF,  afib, AS s/p TAVR 04/2019, CAD, CKD stage IV, HTN, DMT2 poorly controlled with neuropathy, morbid obesity, HLD, GERD, bradycardia/ LBBB with pacemaker,   Significant Hospital Events   6/11 admitted IMTS to ICU, intermittently requiring cleviprex 6/13 concern for aspiration, abx started 6/15 requiring cleviprex gtt 6/17 worsening mental status/ hypoxia- intubated  Consults:  Neurology 6/11 Palliative care 6/15 PCCM 6/17  Procedures:  6/14 right nare cortrak >> 6/17 ETT >> 6/17 bronchoscopy-> RML mucous plug   Significant Diagnostic Tests:  6/11 CTH >> There is hemorrhage in the dependent portion of each lateral ventricle of uncertain etiology. No brain parenchymal hemorrhage elsewhere noted. No mass. No extra-axial fluid.  There is mild atrophy with extensive periventricular small vessel disease. No acute appearing infarct is evident.  There are multiple foci of arterial vascular calcification. There is mucosal thickening in several ethmoid air cells.  6/11 RUQ limited US >> Gallbladder sludge and gallstones without complicating factors.  Right renal cysts stable from prior CT examination.  6/12 CTH >> unchanged [name] layering within the occipital horns of the lateral ventricles  6/13 CTH >> 1. Increased but still small volume intraventricular hemorrhage layering in the occipital horns of the lateral ventricles. 2. Borderline increase in ventriculomegaly, recommend follow-up.  6/13 TTE >> LVEF 60-65%, mod LVH, no wall motion abnormality, U3JS, RV systolic function normal, mild LA dilation, post TAVR, mild dilation of aortic root  6/14 CTH >>  Stable hemorrhage layering in each occipital horn of the lateral ventricles, stable. No hemorrhage appreciable. There is atrophy with extensive supratentorial small vessel disease, stable. No acute infarct evident. There is extensive arterial vascular calcification at multiple sites. There is mucosal thickening in multiple ethmoid air  cells.  6/16 CTH >> Stable intraventricular hemorrhage. Small amount of subarachnoid hemorrhage on the left also stable. No new hemorrhage or infarct Mild ventricular enlargement is stable  6/17 CTH >>  Stable exam with small amount of bilateral intraventricular hemorrhage and subarachnoid overlying L parietal lobe.  No new hemorrhage   6/17 Renal US: no hydronephrosis. Increased cortical echogenicity of bilateral kidneys. Bilateral renal cysts. L cyst arising form inferior pole and complicated of internal areas with increased echogenicity   6/23: CXR BL congestion   Micro Data:  6/11 SARS 2 >> neg 6/15 MRSA PCR >> neg 6/15 BCx2 >> ngtd >> 6/17 BAL  >> nml flora  Antimicrobials:  6/13 azithro >> 6/14 6/13 ceftriaxone >> 6/14 6/14 unasyn >> 6/16 6/17 vanc >>6/18 6/17 cefepime >> 6/17 flagyl >>6/18  Interim history/subjective:   Light sedation, following commands. SBT SAT this AM  Objective   Blood pressure 124/61, pulse 64, temperature 98.1 F (36.7 C), temperature source Axillary, resp. rate (!) 21, height _0  (1.753 m), weight 134.5 kg, SpO2 96 %.    Vent Mode: PSV;CPAP FiO2 (%):  [40 %] 40 % Set Rate:  [19 bmp] 19 bmp Vt Set:  [560 mL] 560 mL PEEP:  [5 cmH20] 5 cmH20 Pressure Support:  [10 cmH20] 10 cmH20 Plateau Pressure:  [14 cmH20-24 cmH20] 14 cmH20   Intake/Output Summary (Last 24 hours) at 02/07/2020 0913 Last data filed at 02/07/2020 0600 Gross per 24 hour  Intake 1531.77 ml  Output 4125 ml  Net -2593.23 ml   Filed Weights   02/04/20 0500 02/06/20 0500 02/07/20 0500  Weight: 133.5 kg 135 kg 134.5 kg    Examination: General: obese, intubated on vent  HEENT: NCAT Sclera clear, tracking  Neuro: AAOX3  CV: RRR, S1 S2 PULM: diminshed BL bases  GI: obese soft, nt nd  Extremities: left forfoot amp, no edema Skin: no rash   Chest x-ray: stable   Labs: potassium stable, sodium stable, BUN rising, Scr stable   Resolved Hospital Problem list     Assessment & Plan:   Acute encephalopathy, improving -likely multifactorial in setting of hypoxia/hypercarpia,  uremia, infection. -CT H stable. Ammonia mildly elevated, doubt HE  P - holding sedation for possible vent liberation   Acute hypoxic and hypercarbic respiratory failure requiring intubation in setting of: -aspiration PNA, mucous plugging, pulmonary edema. Morbid obesity may be contributing to degree-- baseline hypercarbia / OHS -has had trach/peg in past  P:  - holding diuresis today  - SBT SAT - possible extubation  - close obs in ICU and possible transfer to floor with BIPAP QHS and NAPs if stable     Sepsis, likely aspiration PNA P:  - off abx  - observe   [name] - small, bilateral, stable  suspected etiology falls vs spontaneous  P:  - supportive care   HTN Acute on chronic diastolic HF P:  - continue BP meds   AKI on CKD stage IV -bilateral renal cysts. L side arising from inferior pole, complicated by internal areas on increased echogenicity which may reflect septations  P:  - OP MRI at [facility]  - holding diuresis   Hypernatremia- - holding diuresis today   DM2, poorly controlled P:  - levemir, ssi and cbgs  Goal 140-180  Morbid obesity with BMI 38 - baseline OHS  - BIPAP qhs and naps   Goals of Care: - continue with family  - full measures at this time    Best practice:  Diet: TF  Pain/Anxiety/Delirium protocol (if indicated): RASS goal 0, prn propofol / fentanyl prn if needed VAP protocol (if indicated): yes DVT prophylaxis: heparin SQ GI prophylaxis: PPI Glucose control: as above Mobility: BR Code Status: Full. Family Communication:   Disposition: ICU   This patient is critically ill with multiple organ system failure; which, requires frequent high complexity decision making, assessment, support, evaluation, and titration of therapies. This was completed through the application of advanced monitoring technologies and extensive  interpretation of multiple databases. During this encounter critical care time was devoted to patient care services described in this note for 32 minutes.  [facility] 02/07/2020 9:13 AM

## 2020-02-07 NOTE — Consult Note (Signed)
[facility]  HISTORY AND PHYSICAL  [name] is an 74 y.o. male.    Chief Complaint: headaches, n/v, confusion  HPI: Pt is a [name] with a PMH sig for HTN, HLD, Afib not on AC, DM, blindness, CAD, prior ICH with previous trach/ PEG with some baseline aphasia, AS s/p TAVR 04/2019, obesity, and CKD it appears with baseline around 2.5 since 04/2019 who is now seen in consultation at the request of [name] for eval and recs re: azotemia and hypernatremia.    Pt initially admitted 6/11 for headaches, n/v, and syncope.  Found to have a small [name], was started on cleviprex gtt for BP management intermittently.  Thought to have aspirated 6/13 and then required consistent cleviprex gtt to maintain BP by 6/15,  Was intubated 6/17 for worsening MS and hypoxia.  Was aggresively diuresed with Lasix.  Cr has remained at baseline,  Has had hypernatremia and increasing azotemia.  4.1 L UOP yesterday, 3.4 the day before that.  Holding diuresis today.  Was extubated today.  Currently pt unable to voice any complaints.  Able to respond to questions but not really intelligible.    Na trend: 150 6/15--> 147 6/17--> 155 6/20--> 157 6/21--> 154 6/22--> 156 6/23 --> 152 02/07/20 BUN: steadily rising since admission at 7 and now 141 today [facility] 300 q 2 started yesterday  PMH: Past Medical History:  Diagnosis Date  . Arthritis    "all over" (06/23/2013)  . Atrial fibrillation ([place])    not Candidate for Coumadin d/t rectal bleeding  . Benign prostatic hypertrophy   . Cardiomyopathy, hypertensive ([place])     diastolic dysfunction by 7353 echo  . Coronary artery disease     nonobstructive. 2 vessel. cardiac catheter in March 2005 showing 60% LAD occlusion, 30% circumflex occlusion.  . Degenerative joint disease     Right knee.  . Diabetes type 2, uncontrolled ([name])   . Diabetic peripheral neuropathy ([place])   . Diabetic ulcer of thigh ([name])    H/O Rt thigh ulcer.  . Exertional shortness of breath     "comes and goes" (06/23/2013)  . GERD (gastroesophageal reflux disease)   . Gout   . Herpes   . Hyperlipidemia   . Hypertension   . Hypotension 09/05/2013  . Incarcerated ventral hernia    H/O. S/P repair ventral hernia repair 07/2005.  Marland Kitchen LBBB (left bundle branch block)   . Legally blind   . Morbid obesity ([place])   . Obstructive sleep apnea    On CPAP. Last sleep study (06/2009) - Severe obstructive sleep apnea/hypopnea syndrome, apnea-hypopnea index 70.5 per hour with non positional events moderately loud snoring and oxygen desaturation to a nadir of 85% on room air.  . Occult blood positive stool     History of in August 2007.  colonoscopy in January 2008 showing normal colon with fair inadequate prep. By Dr. Silvano Rusk.  . 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 Sepsis ([name]) 08/26/2013   PSH: Past Surgical History:  Procedure Laterality Date  . AMPUTATION Left 06/22/2013   Procedure: AMPUTATION FOOT;  Surgeon: Newt Minion, MD;  Location: [facility];  Service: Orthopedics;  Laterality: Left;  Left Midfoot Amputation  . CARDIAC CATHETERIZATION  10/22/2003   Dilatation of the LV with preserved LV systolic function. Tortous aorta, but no AAA. Moderate disease with 50-60% area of narrowing in the circumflex after the second OM and 70% mid narrowing in the LAD. No intervention.  Marland Kitchen  CARDIOVASCULAR STRESS TEST  01/23/2010   Perfusion defect seen in inferior myocardial region is consistent with diaphragmatic attenuation. Remaining myocardium demonstrates normal myocardial perfusion with no evidence of ischemia or infarct. Stress ECG was non-diagnostic due to LBBB.  Marland Kitchen CATARACT EXTRACTION, BILATERAL Bilateral   . ESOPHAGOGASTRODUODENOSCOPY N/A 10/12/2013   Procedure: ESOPHAGOGASTRODUODENOSCOPY (EGD);  Surgeon: Gwenyth Ober, MD;  Location: [facility];  Service: General;  Laterality: N/A;  bedside  . EYE SURGERY    . FOOT AMPUTATION THROUGH METATARSAL Left 06/22/2013   midfoot/notes  06/22/2013  . HERNIA REPAIR    . INSERT / REPLACE / REMOVE PACEMAKER  2003-11-20   Secondary to symptomatic bradycardia. DDDR pacemaker. By Dr. Rollene Fare.  Randolm Idol / REPLACE / REMOVE PACEMAKER  11/19/12   "old pacemaker died; had new one put in" (07/22/13)  . Pacemaker lead revision  12/2003    likely microdislodgment  of right ventricular lead  . PEG PLACEMENT N/A 10/12/2013   Procedure: PERCUTANEOUS ENDOSCOPIC GASTROSTOMY (PEG) PLACEMENT;  Surgeon: Gwenyth Ober, MD;  Location: [facility];  Service: General;  Laterality: N/A;  . PERMANENT PACEMAKER GENERATOR CHANGE N/A 11/03/2012   Procedure: PERMANENT PACEMAKER GENERATOR CHANGE;  Surgeon: Sanda Klein, MD;  Location: [facility] CATH LAB;  Service: Cardiovascular;  Laterality: N/A;  . REFRACTIVE SURGERY Bilateral   . RIGHT HEART CATH N/A 09/05/2018   Procedure: RIGHT HEART CATH;  Surgeon: Larey Dresser, MD;  Location: [facility] CV LAB;  Service: Cardiovascular;  Laterality: N/A;  . RIGHT/LEFT HEART CATH AND CORONARY ANGIOGRAPHY N/A 04/10/2019   Procedure: RIGHT/LEFT HEART CATH AND CORONARY ANGIOGRAPHY;  Surgeon: [name], MD;  Location: [facility];  Service: Cardiovascular;  Laterality: N/A;  . TEE WITHOUT CARDIOVERSION N/A 09/05/2018   Procedure: TRANSESOPHAGEAL ECHOCARDIOGRAM (TEE);  Surgeon: Larey Dresser, MD;  Location: [facility] ENDOSCOPY;  Service: Cardiovascular;  Laterality: N/A;  . TEE WITHOUT CARDIOVERSION N/A 05/08/2019   Procedure: TRANSESOPHAGEAL ECHOCARDIOGRAM (TEE);  Surgeon: [name], MD;  Location: [facility] CV LAB;  Service: Open Heart Surgery;  Laterality: N/A;  . TRANSCATHETER AORTIC VALVE REPLACEMENT, TRANSFEMORAL N/A 05/08/2019   Procedure: TRANSCATHETER AORTIC VALVE REPLACEMENT, TRANSFEMORAL;  Surgeon: [name], MD;  Location: [facility] CV LAB;  Service: Open Heart Surgery;  Laterality: N/A;  . TRANSTHORACIC ECHOCARDIOGRAM  01/01/2013   EF 50-55%, ventricular septum-thickness moderately increased  .  VENOUS DOPPLER  01/17/2013   No evidence of thrombus or thrombophlebitis. Left GSVs appear enlarged and demonstrate valvular insuffiiciency. Bilateral SSVs appeared patent with no valvular insufficiency seen.  Marland Kitchen VENTRAL HERNIA REPAIR  07/2005    S/P laparotomy, lysis of adhesions, repair of incarcerated ventral hernia with  partial omentectomy.. Performed by Dr. Marlou Starks.    Past Medical History:  Diagnosis Date  . Arthritis    "all over" (Jul 22, 2013)  . Atrial fibrillation ([place])    not Candidate for Coumadin d/t rectal bleeding  . Benign prostatic hypertrophy   . Cardiomyopathy, hypertensive ([place])     diastolic dysfunction by 0165 echo  . Coronary artery disease     nonobstructive. 2 vessel. cardiac catheter in Nov 20, 2003 showing 60% LAD occlusion, 30% circumflex occlusion.  . Degenerative joint disease     Right knee.  . Diabetes type 2, uncontrolled ([place])   . Diabetic peripheral neuropathy ([name])   . Diabetic ulcer of thigh ([place])    H/O Rt thigh ulcer.  . Exertional shortness of breath    "comes and goes" (07-22-13)  . GERD (gastroesophageal reflux disease)   .  Gout   . Herpes   . Hyperlipidemia   . Hypertension   . Hypotension 09/05/2013  . Incarcerated ventral hernia    H/O. S/P repair ventral hernia repair 07/2005.  Marland Kitchen LBBB (left bundle branch block)   . Legally blind   . Morbid obesity ([place])   . Obstructive sleep apnea    On CPAP. Last sleep study (06/2009) - Severe obstructive sleep apnea/hypopnea syndrome, apnea-hypopnea index 70.5 per hour with non positional events moderately loud snoring and oxygen desaturation to a nadir of 85% on room air.  . Occult blood positive stool     History of in August 2007.  colonoscopy in January 2008 showing normal colon with fair inadequate prep. By Dr. Silvano Rusk.  . 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 Sepsis ([place]) 08/26/2013    Medications:   Scheduled: . amLODipine  10 mg Per Tube Daily  . chlorhexidine gluconate  (MEDLINE KIT)  15 mL Mouth Rinse BID  . Chlorhexidine Gluconate Cloth  6 each Topical Daily  . docusate  100 mg Per Tube BID  . famotidine  20 mg Per Tube Daily  . feeding supplement (PRO-STAT SUGAR FREE 64)  60 mL Per Tube BID  . free water  400 mL Per Tube Q2H  . heparin injection (subcutaneous)  5,000 Units Subcutaneous Q8H  . hydrALAZINE  50 mg Per Tube Q8H  . insulin aspart  0-20 Units Subcutaneous Q4H  . insulin aspart  4 Units Subcutaneous Q4H  . insulin detemir  60 Units Subcutaneous BID  . ipratropium-albuterol  3 mL Nebulization Q6H  . mouth rinse  15 mL Mouth Rinse 10 times per day  . metoprolol tartrate  50 mg Per Tube BID  . polyethylene glycol  17 g Per Tube Daily  . sodium chloride flush  3 mL Intravenous Once    Medications Prior to Admission  Medication Sig Dispense Refill  . acetaminophen (TYLENOL) 650 MG CR tablet Take 1,300 mg by mouth 2 (two) times daily.     Marland Kitchen amLODipine (NORVASC) 10 MG tablet Take 10 mg by mouth in the morning. Reported on 12/09/2015    . amoxicillin (AMOXIL) 500 MG capsule Take 4 capsules by mouth one hour prior to dental work (Patient taking differently: Take 2,000 mg by mouth See admin instructions. Take 4 capsules (2,000 mg) by mouth one hour prior to dental work) 4 capsule 5  . antiseptic oral rinse (BIOTENE) LIQD 15 mLs by Mouth Rinse route 3 (three) times daily as needed for dry mouth.     Marland Kitchen aspirin EC 81 MG tablet Take 1 tablet (81 mg total) by mouth daily. 90 tablet 3  . Cholecalciferol (VITAMIN D-3) 25 MCG (1000 UT) CAPS Take 1,000 Units by mouth in the morning.    . citalopram (CELEXA) 20 MG tablet Take 20 mg by mouth in the morning.     . famotidine (PEPCID) 20 MG tablet Take 20 mg by mouth in the morning.     . Febuxostat (ULORIC) 80 MG TABS Take 80 mg by mouth in the morning.     . gabapentin (NEURONTIN) 300 MG capsule Take 300 mg by mouth 2 (two) times daily.    . hydrALAZINE (APRESOLINE) 100 MG tablet Take 1 tablet (100 mg total) by  mouth 3 (three) times daily. 90 tablet 5  . insulin glargine (SEMGLEE) 100 UNIT/ML Solostar Pen Inject 100 Units into the skin at bedtime.    . Liraglutide (VICTOZA) 18 MG/3ML SOPN  Inject 1.8 mg into the skin daily.    Marland Kitchen loperamide (IMODIUM A-D) 2 MG tablet Take 1 mg by mouth 3 (three) times daily as needed for diarrhea or loose stools.    . Menthol, Topical Analgesic, (BIOFREEZE EX) Apply 1 application topically 2 (two) times daily as needed (knee/shoulder pain).     Marland Kitchen metolazone (ZAROXOLYN) 2.5 MG tablet Take 2.5 mg by mouth daily.    . metoprolol succinate (TOPROL-XL) 50 MG 24 hr tablet Take 1 tablet (50 mg total) by mouth daily. Take with or immediately following a meal. 30 tablet 3  . OXYGEN Inhale 2 L/min into the lungs continuous.    . polyethylene glycol (MIRALAX / GLYCOLAX) packet Take 17 g by mouth daily as needed (for hard stools- MIX AS DIRECTED AND DRINK).     . potassium chloride SA (KLOR-CON) 20 MEQ tablet Take 80 mEq by mouth 2 (two) times daily.    . rosuvastatin (CRESTOR) 20 MG tablet Take 20 mg by mouth at bedtime.     . sennosides-docusate sodium (SENOKOT-S) 8.6-50 MG tablet Take 2 tablets by mouth 2 (two) times daily.     . Skin Protectants, Misc. (EUCERIN) cream Apply 1 application topically as needed for dry skin (to affected areas of the body).     . torsemide (DEMADEX) 100 MG tablet Take 100 mg by mouth 2 (two) times daily.      ALLERGIES:  No Known Allergies  FAM HX: Family History  Problem Relation Age of Onset  . Coronary artery disease Sister   . Heart disease Sister   . Allergies Sister   . Asthma Brother   . Rheum arthritis Brother     Social History:   reports that he quit smoking about 16 years ago. His smoking use included cigarettes. He smoked 0.00 packs per day for 0.00 years. He has never used smokeless tobacco. He reports current alcohol use. He reports that he does not use drugs.  ROS: ROS : unable to obtain d/t MS  Blood pressure 124/61,  pulse 64, temperature 98.1 F (36.7 C), temperature source Axillary, resp. rate (!) 21, height '5\' 9"'$  (1.753 m), weight 134.5 kg, SpO2 96 %.   PHYSICAL EXAM: Physical Exam  GEN: obese, sitting in bed, NAD HEENT sclerae anicteric NECK no JVD PULM clear anteriorly, muffled posterior bases CV RRR ABD obese EXT no LE edema NEURO responds to voice but not AAO  SKIN poor turgor   Results for orders placed or performed during the hospital encounter of 01/25/20 (from the past 48 hour(s))  Glucose, capillary     Status: Abnormal   Collection Time: 02/05/20 11:22 AM  Result Value Ref Range   Glucose-Capillary 201 (H) 70 - 99 mg/dL    Comment: Glucose reference range applies only to samples taken after fasting for at least 8 hours.   Comment 1 Notify RN    Comment 2 Document in Chart   Glucose, capillary     Status: Abnormal   Collection Time: 02/05/20  3:27 PM  Result Value Ref Range   Glucose-Capillary 160 (H) 70 - 99 mg/dL    Comment: Glucose reference range applies only to samples taken after fasting for at least 8 hours.   Comment 1 Notify RN    Comment 2 Document in Chart   Glucose, capillary     Status: Abnormal   Collection Time: 02/05/20  7:29 PM  Result Value Ref Range   Glucose-Capillary 148 (H) 70 - 99 mg/dL  Comment: Glucose reference range applies only to samples taken after fasting for at least 8 hours.  Glucose, capillary     Status: Abnormal   Collection Time: 02/05/20 11:09 PM  Result Value Ref Range   Glucose-Capillary 176 (H) 70 - 99 mg/dL    Comment: Glucose reference range applies only to samples taken after fasting for at least 8 hours.  Glucose, capillary     Status: Abnormal   Collection Time: 02/06/20  3:16 AM  Result Value Ref Range   Glucose-Capillary 119 (H) 70 - 99 mg/dL    Comment: Glucose reference range applies only to samples taken after fasting for at least 8 hours.  Basic metabolic panel     Status: Abnormal   Collection Time: 02/06/20  6:37 AM   Result Value Ref Range   Sodium 156 (H) 135 - 145 mmol/L   Potassium 4.2 3.5 - 5.1 mmol/L   Chloride 116 (H) 98 - 111 mmol/L   CO2 28 22 - 32 mmol/L   Glucose, Bld 100 (H) 70 - 99 mg/dL    Comment: Glucose reference range applies only to samples taken after fasting for at least 8 hours.   BUN 134 (H) 8 - 23 mg/dL   Creatinine, Ser 2.59 (H) 0.61 - 1.24 mg/dL   Calcium 9.5 8.9 - 10.3 mg/dL   GFR calc non Af Amer 24 (L) >60 mL/min   GFR calc Af Amer 28 (L) >60 mL/min   Anion gap 12 5 - 15    Comment: Performed at [facility] [address], [place], [place] [address]  CBC     Status: Abnormal   Collection Time: 02/06/20  6:37 AM  Result Value Ref Range   WBC 12.1 (H) 4.0 - 10.5 K/uL   RBC 4.50 4.22 - 5.81 MIL/uL   Hemoglobin 10.7 (L) 13.0 - 17.0 g/dL   HCT 38.1 (L) 39 - 52 %   MCV 84.7 80.0 - 100.0 fL   MCH 23.8 (L) 26.0 - 34.0 pg   MCHC 28.1 (L) 30.0 - 36.0 g/dL   RDW 17.6 (H) 11.5 - 15.5 %   Platelets 119 (L) 150 - 400 K/uL    Comment: CONSISTENT WITH PREVIOUS RESULT REPEATED TO VERIFY    nRBC 0.4 (H) 0.0 - 0.2 %    Comment: Performed at [facility], [place] [address] [place], [place] [address]  Glucose, capillary     Status: Abnormal   Collection Time: 02/06/20  7:36 AM  Result Value Ref Range   Glucose-Capillary 113 (H) 70 - 99 mg/dL    Comment: Glucose reference range applies only to samples taken after fasting for at least 8 hours.  Glucose, capillary     Status: Abnormal   Collection Time: 02/06/20 11:08 AM  Result Value Ref Range   Glucose-Capillary 112 (H) 70 - 99 mg/dL    Comment: Glucose reference range applies only to samples taken after fasting for at least 8 hours.  Glucose, capillary     Status: Abnormal   Collection Time: 02/06/20  3:45 PM  Result Value Ref Range   Glucose-Capillary 160 (H) 70 - 99 mg/dL    Comment: Glucose reference range applies only to samples taken after fasting for at least 8 hours.  Basic metabolic panel     Status:  Abnormal   Collection Time: 02/06/20  5:42 PM  Result Value Ref Range   Sodium 153 (H) 135 - 145 mmol/L   Potassium 3.9 3.5 -  5.1 mmol/L   Chloride 113 (H) 98 - 111 mmol/L   CO2 26 22 - 32 mmol/L   Glucose, Bld 161 (H) 70 - 99 mg/dL    Comment: Glucose reference range applies only to samples taken after fasting for at least 8 hours.   BUN 137 (H) 8 - 23 mg/dL   Creatinine, Ser 2.52 (H) 0.61 - 1.24 mg/dL   Calcium 9.6 8.9 - 10.3 mg/dL   GFR calc non Af Amer 25 (L) >60 mL/min   GFR calc Af Amer 29 (L) >60 mL/min   Anion gap 14 5 - 15    Comment: Performed at [facility] [address] [place], [place] [address]  Glucose, capillary     Status: Abnormal   Collection Time: 02/06/20  7:23 PM  Result Value Ref Range   Glucose-Capillary 162 (H) 70 - 99 mg/dL    Comment: Glucose reference range applies only to samples taken after fasting for at least 8 hours.  Glucose, capillary     Status: Abnormal   Collection Time: 02/06/20 11:31 PM  Result Value Ref Range   Glucose-Capillary 167 (H) 70 - 99 mg/dL    Comment: Glucose reference range applies only to samples taken after fasting for at least 8 hours.  Basic metabolic panel     Status: Abnormal   Collection Time: 02/07/20 12:02 AM  Result Value Ref Range   Sodium 152 (H) 135 - 145 mmol/L   Potassium 3.8 3.5 - 5.1 mmol/L   Chloride 112 (H) 98 - 111 mmol/L   CO2 26 22 - 32 mmol/L   Glucose, Bld 182 (H) 70 - 99 mg/dL    Comment: Glucose reference range applies only to samples taken after fasting for at least 8 hours.   BUN 141 (H) 8 - 23 mg/dL   Creatinine, Ser 2.54 (H) 0.61 - 1.24 mg/dL   Calcium 9.6 8.9 - 10.3 mg/dL   GFR calc non Af Amer 24 (L) >60 mL/min   GFR calc Af Amer 28 (L) >60 mL/min   Anion gap 14 5 - 15    Comment: Performed at [facility] [address] [place], [place] [address]  CBC     Status: Abnormal   Collection Time: 02/07/20 12:02 AM  Result Value Ref Range   WBC 12.0 (H) 4.0 - 10.5 K/uL   RBC  4.68 4.22 - 5.81 MIL/uL   Hemoglobin 11.0 (L) 13.0 - 17.0 g/dL   HCT 38.9 (L) 39 - 52 %   MCV 83.1 80.0 - 100.0 fL   MCH 23.5 (L) 26.0 - 34.0 pg   MCHC 28.3 (L) 30.0 - 36.0 g/dL   RDW 17.7 (H) 11.5 - 15.5 %   Platelets 117 (L) 150 - 400 K/uL    Comment: REPEATED TO VERIFY   nRBC 0.3 (H) 0.0 - 0.2 %    Comment: Performed at [facility], [place] [address], [place], [place] [address]  Glucose, capillary     Status: Abnormal   Collection Time: 02/07/20  3:18 AM  Result Value Ref Range   Glucose-Capillary 136 (H) 70 - 99 mg/dL    Comment: Glucose reference range applies only to samples taken after fasting for at least 8 hours.  Glucose, capillary     Status: Abnormal   Collection Time: 02/07/20  7:52 AM  Result Value Ref Range   Glucose-Capillary 142 (H) 70 - 99 mg/dL    Comment: Glucose reference range applies  only to samples taken after fasting for at least 8 hours.    DG CHEST PORT 1 VIEW  Result Date: 02/06/2020 CLINICAL DATA:  Cerebral hemorrhage. Seizure. Endotracheal tube placement. EXAM: PORTABLE CHEST 1 VIEW COMPARISON:  02/04/2020 FINDINGS: 0520 hours. Endotracheal tube tip is 4.6 cm above the base of the carina. Feeding tube is obscured at the level of the lower mediastinum due to technique. The cardio pericardial silhouette is enlarged. Bibasilar collapse/consolidation again noted with vascular congestion and pulmonary edema. Telemetry leads overlie the chest. IMPRESSION: 1. Endotracheal tube tip is 4.6 cm above the base of the carina. 2. Bibasilar collapse/consolidation with pulmonary edema, similar to prior. Electronically Signed   By: Misty Stanley M.D.   On: 02/06/2020 08:43    Assessment/Plan 1.  Azotemia: likely multifactorial with catabolic state, TF, and aggressive diuresis.  I agree with holding diuresis and giving back some more free water.  Increase to 400 q 2 via Coretrack. CXR with pulm edema 6/23 so don't want to add D5W on top of that and compromise respiratory  status more.  Recommend switching to low osmolar load TF like nepro as well--> high protein TF can contribute.  2.  Hypernatremia: as above, has free water deficit and likely osmotic diuresis component from TF.  As above.  Don't suspect DI but will see what UOP is off diuretics today to assess.  3.  CKD: Renal US negative for obstruction 6/17, some acquired cysts, appears to be at baseline  4.  [name]: per PCCM  5.  Acute hypoxic RF: extubated today--> looks tenuous but since has already had prior trach/ PEG hopefully he will fly  6.  Dispo: pending  Madelon Lips 02/07/2020, 11:20 AM

## 2020-02-07 NOTE — Procedures (Signed)
Extubation Procedure Note  Patient Details:   Name: [name] DOB: [birth year] MRN: [id number]   Airway Documentation:    Vent end date: 02/07/20 Vent end time: 0825   Evaluation  O2 sats: stable throughout Complications: No apparent complications Patient did tolerate procedure well. Bilateral Breath Sounds: Expiratory wheezes (Upper airway wheeze)   Yes   Patient was extubated to a 4L [place] per CCM order. Positive cuff leak prior to extubation.   Iyania Denne L 02/07/2020, 8:25 AM

## 2020-02-07 NOTE — Progress Notes (Signed)
Occupational Therapy Treatment Patient Details Name: [name] MRN: [id number] DOB: 1946/02/09 Today's Date: 02/07/2020    History of present illness Pt is a 74 y.o. male with PMH of HTN, PAF, SSS, CHF, tachycardia, Heart failure, cerebral artery occlusion with cerebral infarction, OSA, chronic obstructive lung disease, pulmonary edema, DM, type II, CVA, DJD, stage 3 chronic kidney disease, AKI, Gout, depression, blindness, coarse tremors, CAD, L mid foot amputation, amd pacemaker. Pt sent to ED from outpatient imaging when pt had syncope episode, urinated on self, and became confused. CT reveals stable hemorrhage layering in each occipital horn of the lateral ventricles, stable. Pt intubated on 6/17 and extubated on 6/24.   OT comments  Pt seen after extubation this AM. Session focused on increasing alertness, progressing BADLs, and progressing mobility. Pt on 6L  with VSS in session. Pt engaged in grooming with max-total A with elbow support from therapist. He is mostly performing automatic reactions at this time (itching face, trying to pull at tubes). Pt placed in chair position with improved ability to mobilize secretions. He currently requires total A +2 to pull into long sitting in bed. He continues to have varying level of alertness and responsiveness. He did have x2-3 automatic responses of "yes ma'am" in session. Otherwise remains inconsistent in following commands. D/c recs remain appropriate. Goals updated to reflect current progress.   Follow Up Recommendations  SNF;Supervision/Assistance - 24 hour    Equipment Recommendations  None recommended by OT    Recommendations for Other Services      Precautions / Restrictions Precautions Precautions: Fall Precaution Comments: monitor SpO2; low vision from MD Restrictions Weight Bearing Restrictions: No       Mobility Bed Mobility Overal bed mobility: Needs Assistance Bed Mobility:  (long sitting)           General  bed mobility comments: pt assisted into bed in chair mode, PT/OT assist in pulling pt into long sitting position with totalA x2  Transfers                      Balance Overall balance assessment: Needs assistance Sitting-balance support: Feet supported;Bilateral upper extremity supported Sitting balance-Leahy Scale: Zero Sitting balance - Comments: totalA x2 to maintain long sitting in bed in chair position Postural control: Posterior lean                                 ADL either performed or assessed with clinical judgement   ADL Overall ADL's : Needs assistance/impaired Eating/Feeding: NPO   Grooming: Maximal assistance;Total assistance;Bed level Grooming Details (indicate cue type and reason): pt in chair position. With elbow support, was able to engage in automatic face itching/rubbing. Max-total A for hand over hand to face for washing face and oral care. Pt not consistent with purposeful responses at this time                               General ADL Comments: otherwise continues to remain total A +2 for BADLs. Level of alertness slightly improving- RN reduced precedex in session     Vision Baseline Vision/History: Legally blind;Macular Degeneration Additional Comments: per chart review is low vision from progressing macular degeneration   Perception     Praxis      Cognition Arousal/Alertness: Lethargic Behavior During Therapy: Flat affect Overall Cognitive Status: Impaired/Different from baseline Area of  Impairment: Attention;Memory;Following commands;Safety/judgement;Awareness;Problem solving;Orientation                 Orientation Level: Disoriented to;Place;Time;Situation (cont to be difficult to assess due to alertness) Current Attention Level: Focused Memory: Decreased recall of precautions;Decreased short-term memory Following Commands: Follows one step commands inconsistently Safety/Judgement: Decreased awareness  of safety;Decreased awareness of deficits Awareness: Intellectual Problem Solving: Slow processing;Requires verbal cues;Requires tactile cues;Decreased initiation General Comments: precedex turned off during session which assisted in patient arousal. He is currently only performing automatic reactions. Was able to answer "yes ma'am" to this therapist in session        Exercises Exercises: Other exercises Other Exercises Other Exercises: PROM of BUEs; when elbow supported by therapist pt could engage in automatic reactions   Shoulder Instructions       General Comments VSS during session, pt on 6L [place]. Pt demonstrates no purposeful movement of LE's and is unable to follow commands with LEs at this time, does demonstrate some automatic movement of LEs to tactile stimulation once during session. Pt with some automatic movement of BUEs against gravity but remains inconsistent with command following    Pertinent Vitals/ Pain       Pain Assessment: Faces Faces Pain Scale: Hurts whole lot Pain Location: R knee with mobility Pain Descriptors / Indicators: Grimacing Pain Intervention(s): Monitored during session;Repositioned  Home Living                                          Prior Functioning/Environment              Frequency  Min 2X/week        Progress Toward Goals  OT Goals(current goals can now be found in the care plan section)     Acute Rehab OT Goals Patient Stated Goal: unable OT Goal Formulation: Patient unable to participate in goal setting Time For Goal Achievement: 02/21/20 Potential to Achieve Goals: [place] Discharge plan remains appropriate    Co-evaluation    PT/OT/SLP Co-Evaluation/Treatment: Yes Reason for Co-Treatment: For patient/therapist safety;To address functional/ADL transfers;Necessary to address cognition/behavior during functional activity PT goals addressed during session: Mobility/safety with  mobility;Strengthening/ROM;Balance OT goals addressed during session: ADL's and self-care;Strengthening/ROM      AM-PAC OT "6 Clicks" Daily Activity     Outcome Measure   Help from another person eating meals?: Total Help from another person taking care of personal grooming?: A Lot Help from another person toileting, which includes using toliet, bedpan, or urinal?: Total Help from another person bathing (including washing, rinsing, drying)?: Total Help from another person to put on and taking off regular upper body clothing?: Total Help from another person to put on and taking off regular lower body clothing?: Total 6 Click Score: 7    End of Session Equipment Utilized During Treatment: Oxygen  OT Visit Diagnosis: Other symptoms and signs involving cognitive function;Other symptoms and signs involving the nervous system (R29.898);Low vision, both eyes (H54.2);Apraxia (R48.2);Muscle weakness (generalized) (M62.81)   Activity Tolerance Patient limited by lethargy   Patient Left in bed;with call bell/phone within reach;with bed alarm set;with restraints reapplied   Nurse Communication Mobility status        Time: 6283-6629 OT Time Calculation (min): 29 min  Charges: OT General Charges $OT Visit: 1 Visit OT Treatments $Self Care/Home Management : 8-22 mins  Zenovia Jarred, MSOT, OTR/L Acute Rehabilitation Services [facility]  Office Number: [phone] Pager: [phone]  Zenovia Jarred 02/07/2020, 1:50 PM

## 2020-02-07 NOTE — Progress Notes (Signed)
Palliative:  HPI: 74 y.o.malewith past medical history of stroke requiring trach/PEG previously, HFpEF, atrial fibrillation, severe aortic stenosis s/p TAVR, CKD stage IV, hypertension, OHS/OSA uses CPAP, diabetes, s/p left foot amputationadmitted on 6/11/2021with ongoing headache and CT head bilateral intraventricular hemorrhage.Hospitalization complicated by ongoing poor mental status, aspiration pneumonia, hypertension, and concern for airway protection.Required intubation 01/31/20 and extubated 02/07/20.   I met today at [facility]' bedside and no family/visitors. He is extubated and alert. He looks and tracks to voice but does not follow any commands for me. I ask simple questions and he appears to be trying to make a verbal response but unable to produce any words but only incomprehensible sounds. He is calm.   I called and updated daughter, [name]. [name] is surprised that her father has been extubated. We discussed that the medical team wants to give him every possible chance of coming off ventilator and avoiding tracheostomy at all possible. We do know that he is very high risk for re-intubation and his breath sounds were very distant and diminished. I worry about his ability to protect his airway and his continued altered mental status. [name] agrees.   [name] shares that she had a conference call with her siblings and family last night to discuss Channing' quality of life and how to move forward. She shares that herself and her aunt [place]' sister) are concerned for his quality of life and have been leaning towards no tracheostomy. However, she shares that her siblings are all over with their thoughts and opinions. We discussed the importance of them seeing his condition in order to truly make an informed decision and I have encouraged family meeting in person or via phone to further discuss as a family. She will reach out to her siblings to see who feels they need to see [place] to help them  make decisions.   I heard back from [place] and her brother, Sheehan Stacey, is planning to visit and I approved this with RN. Very appropriate as I feel that [place] is very high risk for re-intubation and ability to protect his airway. Oxygen needs are already increasing rather high. I also encouraged her to continue reaching out to her siblings and I will touch base with her tomorrow if they wish to have conference meeting or call and see where they have left things.   All questions/concerns addressed. Emotional support provided. Discussed with RN and [name].   Exam: Alert but not following any commands. Breathing no distress but severely diminished with wheezing. Large body habitus. Abd soft. Extremities warm to touch.   Plan: - Ongoing family discussions.  - Desire for reintubation unclear currently and family trying to get on the same page.   [place], NP Palliative Medicine Team Pager [phone] (Please see [email or web address] for schedule) Team Phone [phone]    Greater than 50%  of this time was spent counseling and coordinating care related to the above assessment and plan

## 2020-02-07 NOTE — TOC Progression Note (Signed)
Transition of Care [facility]) - Progression Note    Patient Details  Name: [name] MRN: [id number] Date of Birth: [birth year]  [facility] CM/SW Contact  Oren Section Cleta Alberts, RN Phone Number: 02/07/2020, 5:32 PM  Clinical Narrative: Left message for Darrelyn Hillock, PACE nurse, requesting info on prior living situation.       Expected Discharge Plan: Skilled Nursing Facility Barriers to Discharge: Continued Medical Work up  Expected Discharge Plan and Services Expected Discharge Plan: [place]                                               Social Determinants of Health (SDOH) Interventions    Readmission Risk Interventions Readmission Risk Prevention Plan 05/08/2019  [place] or Home Care Consult Complete  Social Work Consult for [place] Planning/Counseling Complete  Medication Review [employer]) Complete  Some recent data might be hidden   Reinaldo Raddle, RN, BSN  Trauma/Neuro ICU Case Manager [phone]

## 2020-02-07 NOTE — Progress Notes (Signed)
eLink Physician-Brief Progress Note Patient Name: [name] DOB: 1945/08/29 MRN: [id number]   Date of Service  02/07/2020  HPI/Events of Note  Patient needs a BMET  eICU Interventions  BMET ordered        Frederik Pear 02/07/2020, 8:18 PM

## 2020-02-07 NOTE — Progress Notes (Signed)
Nutrition Follow-up ° °DOCUMENTATION CODES:  ° °Obesity unspecified ° °INTERVENTION:  °Continue Tube feeding via cortrak tube: °Vital High Protein at 55 ml/h (1320 ml per day) °Pro-stat 60 ml BID °  °Provides 1720 kcal, 175 gm protein, 1103 ml free water daily °  °300 ml free water every 2 hours (per MD) °Total free water: 4703 ml  ° °NUTRITION DIAGNOSIS:  ° °Inadequate oral intake related to inability to eat as evidenced by NPO status. ° °Ongoing. ° °GOAL:  ° °Patient will meet greater than or equal to 90% of their needs ° °Met with TF.  ° °MONITOR:  ° °Weight trends, Labs, I & O's, TF tolerance ° °REASON FOR ASSESSMENT:  ° °Consult °Enteral/tube feeding initiation and management ° °ASSESSMENT:  ° °Pt with a PMH significant for Afib, cardiomyopathy, PPM, coronary disease, HTN, legally blind, T2DM, HTN, and HLD presented with N/V, headache, recent fall, recent syncope and AMS. Pt admitted with ICH. ° °6/14 cortrak placed; Osmolite 1.5 @ 70 ml/hr with 30 ml Prostat TID °6/17 intubated early this am; s/p bronchoscopy with RML mucous plug  ° °Discussed pt with RN.  ° °Patient is currently intubated on ventilator support °MV: 10.3 L/min °Temp (24hrs), Avg:97.9 °F (36.6 °C), Min:97.4 °F (36.3 °C), Max:98.1 °F (36.7 °C) ° °TF: Vital High Protein @ 55ml/hr, 60ml Pro-stat BID, 300ml free water Q2H (per MD) ° °Labs: Na 152 (H, trending down) °CBGs 136-167 °Medications: Colace, Pepcid, Novolog, Levemir, Miralax ° °Diet Order:   °Diet Order   °       °  Diet NPO time specified Except for: Sips with Meds  Diet effective now       °  °  °  °  ° ° °EDUCATION NEEDS:  ° °Not appropriate for education at this time ° °Skin:  Skin Assessment: Skin Integrity Issues: °Skin Integrity Issues:: Other (Comment) °Other: amputation all L toes ° °Last BM:  6/22 type 6 ° °Height:  ° °Ht Readings from Last 1 Encounters:  °01/31/20 5' 9" (1.753 m)  ° ° °Weight:  ° °Wt Readings from Last 1 Encounters:  °02/07/20 134.5 kg  ° ° °Ideal Body Weight:   72.7 kg ° °BMI:  Body mass index is 43.79 kg/m². ° °Estimated Nutritional Needs:  ° °Kcal:  1500-1900 ° °Protein:  145-180 grams ° °Fluid:  2 L/day ° ° ° °Amanda Averett, MS, RD, LDN °RD pager number and weekend/on-call pager number located in Amion. ° °

## 2020-02-08 LAB — BASIC METABOLIC PANEL
Anion gap: 10 (ref 5–15)
BUN: 137 mg/dL — ABNORMAL HIGH (ref 8–23)
CO2: 28 mmol/L (ref 22–32)
Calcium: 9.2 mg/dL (ref 8.9–10.3)
Chloride: 110 mmol/L (ref 98–111)
Creatinine, Ser: 2.52 mg/dL — ABNORMAL HIGH (ref 0.61–1.24)
GFR calc Af Amer: 29 mL/min — ABNORMAL LOW (ref >60)
GFR calc non Af Amer: 25 mL/min — ABNORMAL LOW (ref >60)
Glucose, Bld: 165 mg/dL — ABNORMAL HIGH (ref 70–99)
Potassium: 3.4 mmol/L — ABNORMAL LOW (ref 3.5–5.1)
Sodium: 148 mmol/L — ABNORMAL HIGH (ref 135–145)

## 2020-02-08 LAB — CBC
HCT: 37 % — ABNORMAL LOW (ref 39.0–52.0)
Hemoglobin: 10.6 g/dL — ABNORMAL LOW (ref 13.0–17.0)
MCH: 24 pg — ABNORMAL LOW (ref 26.0–34.0)
MCHC: 28.6 g/dL — ABNORMAL LOW (ref 30.0–36.0)
MCV: 83.7 fL (ref 80.0–100.0)
Platelets: 129 10*3/uL — ABNORMAL LOW (ref 150–400)
RBC: 4.42 MIL/uL (ref 4.22–5.81)
RDW: 17.7 % — ABNORMAL HIGH (ref 11.5–15.5)
WBC: 13.9 10*3/uL — ABNORMAL HIGH (ref 4.0–10.5)
nRBC: 0.4 % — ABNORMAL HIGH (ref 0.0–0.2)

## 2020-02-08 LAB — GLUCOSE, CAPILLARY
Glucose-Capillary: 154 mg/dL — ABNORMAL HIGH (ref 70–99)
Glucose-Capillary: 161 mg/dL — ABNORMAL HIGH (ref 70–99)
Glucose-Capillary: 183 mg/dL — ABNORMAL HIGH (ref 70–99)

## 2020-02-08 MED ORDER — POTASSIUM CHLORIDE 20 MEQ/15ML (10%) PO SOLN
40.0000 meq | Freq: Once | ORAL | Status: AC
  Administered 2020-02-08: 40 meq
  Filled 2020-02-08: qty 30

## 2020-02-08 MED ORDER — IPRATROPIUM-ALBUTEROL 0.5-2.5 (3) MG/3ML IN SOLN
3.0000 mL | Freq: Three times a day (TID) | RESPIRATORY_TRACT | Status: DC
  Administered 2020-02-08 – 2020-02-21 (×41): 3 mL via RESPIRATORY_TRACT
  Filled 2020-02-08 (×11): qty 3
  Filled 2020-02-08: qty 30
  Filled 2020-02-08 (×29): qty 3

## 2020-02-08 MED ORDER — NEPRO/CARBSTEADY PO LIQD
1000.0000 mL | ORAL | Status: DC
  Administered 2020-02-08 – 2020-02-10 (×3): 1000 mL
  Filled 2020-02-08 (×4): qty 1000

## 2020-02-08 NOTE — Progress Notes (Signed)
Palliative:  HPI:74 y.o.malewith past medical history of stroke requiring trach/PEG previously, HFpEF, atrial fibrillation, severe aortic stenosis s/p TAVR, CKD stage IV, hypertension, OHS/OSA uses CPAP, diabetes, s/p left foot amputationadmitted on 6/11/2021with ongoing headache and CT head bilateral intraventricular hemorrhage.Hospitalization complicated by ongoing poor mental status, aspiration pneumonia, hypertension, and concern for airway protection.Required intubation 01/31/20 and extubated 02/07/20.    I met today at [name]' bedside. His daughter, [name], is present. She confirms that her sister, [name], and herself are his documented HCPOA. However, there are 5 children in total that are all involved in decision making. I spoke further with [name] about the conversation that I have been having with sister regarding recurrent injury to brain and 2 weeks of ongoing and worsening health condition even with aggressive care. I pointed out use of accessory muscles and labored breathing even as we speak. I am concerned that he is quickly heading towards reintubation and that I have been expecting that he will be unable to protect his airway and I do not anticipate this will improve. I explain that his mental status has not improved in the 2 weeks he has been here and this is more concerning of what his future will look like.   I am concerned that having long term ventilator support and inability to have meaningful interactions is an extremely poor QOL. I explained that even though we can provide many medical interventions to prolong life that this doesn't always mean that we should. We discussed the alternative of continuing support but if he were to decline further offering comfort to ensure he does not suffer.   When I ask [name] what she thinks currently after our conversation. She shares that she would still want him to have intubation and tracheostomy. I did explain that he is not  expected to improve as he did before after trach. I provided her with Hard Choices booklet. We discussed the importance of discussing and making a decision sooner than later with worsening respiratory status. She will speak with her siblings to arrange time to meet in person or speak via telephone with all children to discuss plan and decision how to move forward and what is best for her father.   I did touch base with [name] and explained the plan for conversation tomorrow. I will have my colleague, [name], reach out to [facility] in the morning to confirm time.   All questions/concerns addressed. Emotional support provided. Discussed with my colleague Ryan Dow, NP as well as Dr. Valeta Fletcher.   Exam: Alert but more lethargic today. He turns head to voice but not following any other commands. Restless, agitated. Breathing labored with accessory muscle use. Abd soft.   Plan: - Still trying to arrange family conference to discuss plan to move forward. From my conversations they seem split on their wishes for their father. [place] are [facility] (I have not received paperwork).   16RCV  Ryan Goodnow, NP Palliative Medicine Team Pager [phone] (Please see [email or web address] for schedule) Team Phone [phone]    Greater than 50%  of this time was spent counseling and coordinating care related to the above assessment and plan

## 2020-02-08 NOTE — Progress Notes (Signed)
[facility] Progress Note    Assessment/ Plan:   1.  Azotemia: likely multifactorial with catabolic state, TF, and aggressive diuresis.  I agree with holding diuresis and giving back some more free water.  Increase to 400 q 2 via Coretrack. CXR with pulm edema 6/23 so don't want to add D5W on top of that and compromise respiratory status more.  have switched to Nepro.  Slowly improving.  He is a poor candidate for dialysis.     2.  Hypernatremia: as above, has free water deficit and likely osmotic diuresis component from TF.  As above.  UOP not consistent with DI  3.  CKD: Renal US negative for obstruction 6/17, some acquired cysts, appears to be at baseline  4.  [name]: per PCCM  5.  Acute hypoxic RF: extubated 6/24-> looks tenuous but since has already had prior trach/ PEG hopefully he will fly.  MS is still a concern.  Greatly appreciate palliative care discussions.   6.  Dispo: pending  Subjective:    Na and BUN marginally better.  MS is not.  On facemask this AM   Objective:   BP 138/70   Pulse 64   Temp 99.2 F (37.3 C) (Axillary)   Resp (!) 24   Ht 5\' 9"  (1.753 m) Comment: measured x 3  Wt 134.5 kg   SpO2 93%   BMI 43.79 kg/m   Intake/Output Summary (Last 24 hours) at 02/08/2020 0943 Last data filed at 02/08/2020 0400 Gross per 24 hour  Intake 2959.02 ml  Output 1475 ml  Net 1484.02 ml   Weight change:   Physical Exam: GEN: eyes closed, on FM HEENT sclerae anicteric NECK no JVD PULM clear anteriorly, muffled posterior bases CV RRR ABD obese EXT no LE edema, s/p L TMT NEURO responds to voice but not AAO  SKIN poor turgor Imaging: No results found.  Labs: BMET Recent Labs  Lab 02/02/20 518 161 2705 02/02/20 2641 02/03/20 0257 02/03/20 0257 02/04/20 0512 02/05/20 0649 02/06/20 0637 02/06/20 1742 02/07/20 0002 02/07/20 2236 02/08/20 0425  NA 153*   < > 155*   < > 157* 154* 156* 153* 152* 150* 148*  K 3.4*   < > 5.0   < > 3.9 3.8 4.2  3.9 3.8 3.4* 3.4*  CL 114*   < > 118*   < > 121* 117* 116* 113* 112* 110 110  CO2 29   < > 26   < > 26 24 28 26 26 27 28   GLUCOSE 126*   < > 149*   < > 146* 163* 100* 161* 182* 157* 165*  BUN 124*   < > 130*   < > 123* 130* 134* 137* 141* 136* 137*  CREATININE 2.97*   < > 2.80*   < > 2.59* 2.67* 2.59* 2.52* 2.54* 2.61* 2.52*  CALCIUM 9.0   < > 9.0   < > 9.2 9.2 9.5 9.6 9.6 9.3 9.2  PHOS 4.3  --  3.1  --  3.4 3.4  --   --   --   --   --    < > = values in this interval not displayed.   CBC Recent Labs  Lab 02/04/20 0512 02/06/20 0637 02/07/20 0002 02/08/20 0425  WBC 9.4 12.1* 12.0* 13.9*  HGB 9.8* 10.7* 11.0* 10.6*  HCT 34.5* 38.1* 38.9* 37.0*  MCV 83.5 84.7 83.1 83.7  PLT 106* 119* 117* 129*    Medications:    . amLODipine  10  mg Per Tube Daily  . chlorhexidine  15 mL Mouth Rinse BID  . Chlorhexidine Gluconate Cloth  6 each Topical Daily  . docusate  100 mg Per Tube BID  . famotidine  20 mg Per Tube Daily  . feeding supplement (PRO-STAT SUGAR FREE 64)  60 mL Per Tube BID  . free water  400 mL Per Tube Q2H  . heparin injection (subcutaneous)  5,000 Units Subcutaneous Q8H  . hydrALAZINE  50 mg Per Tube Q8H  . insulin aspart  0-20 Units Subcutaneous Q4H  . insulin aspart  4 Units Subcutaneous Q4H  . insulin detemir  60 Units Subcutaneous BID  . ipratropium-albuterol  3 mL Nebulization TID  . mouth rinse  15 mL Mouth Rinse q12n4p  . metoprolol tartrate  50 mg Per Tube BID  . polyethylene glycol  17 g Per Tube Daily  . sodium chloride flush  3 mL Intravenous Once      Madelon Lips MD 02/08/2020, 9:43 AM

## 2020-02-08 NOTE — Progress Notes (Addendum)
NAME:  [name], MRN:  [id number], DOB:  06/23/1946, LOS: 12 ADMISSION DATE:  01/25/2020, CONSULTATION DATE:  01/31/2020 REFERRING MD:  Graciella Freer, CHIEF COMPLAINT:  Worsening AMS/ hypoxia  Brief History   38 yoM admitted 6/11 with small bilateral [name] requiring intermittent cleviprex for strict BP control, with hospitalization complicated by likely aspiration pneumonia, AKI on CKD, and worsening mental status  and desaturation episodes with increased O2 needs.  Mental status prevents BiPAP.  Intubated for hypercarbic respiratory failure  History of present illness   HPI obtained from medical chart review as patient is unresponsive.   74 year old male with prior extensive medical history significant for blindness, prior ICH with previous trach/ PEG with some baseline aphasia, HFpEF, AS s/p TAVR 04/2019, afib not on AC due to GI bleeding and ICH, CAD, OSA, CKD stage IV, HTN, morbid obesity, and poorly controlled DMT2 with neuropathy who presented 6/11 with 3 day history of bifrontal headache, recent falls 2-3 weeks prior, increasing confusion/ syncope and nausea/ vomiting.  Found on workup to have small bilateral intraventricular hemorrhage of unclear etiology, suspected to be traumatic given fall history vs spontaneous.   MRI unable to be obtained given pacemaker.  Admitted to IMTS with neurology consulting.  Required intermittent cleviprex gtt for strict BP control. Hospitalization complicated by suspected aspiration pneumonia on 6/13 and started empirically on antibiotics, and worsening AKI on CKD, and progressive decline in mental status.  Serial imaging mostly stable.  Palliative care consulted to help with goals of care and Overnight, 6/16, he had worsening mental status , increased O2 needs, and desaturations. ABG obtained showed 7.306/ 68/ 72/ 33.5.   PCCM consulted for further management of respiratory failure.   Past Medical History  Blindness, OSA, prior ICH with previous trach/ PEG, HFpEF,  afib, AS s/p TAVR 04/2019, CAD, CKD stage IV, HTN, DMT2 poorly controlled with neuropathy, morbid obesity, HLD, GERD, bradycardia/ LBBB with pacemaker,   Significant Hospital Events   6/11 admitted IMTS to ICU, intermittently requiring cleviprex 6/13 concern for aspiration, abx started 6/15 requiring cleviprex gtt 6/17 worsening mental status/ hypoxia- intubated 6/24 extubated to BiPAP 6/25 started on dexmet overnight for reported agitation. Remains on BiPAP. Discontinuing BiPAP during day shift   Consults:  Neurology 6/11 Palliative care 6/15 PCCM 6/17  Procedures:  6/14 right nare cortrak >> 6/17 ETT >> 6/17 bronchoscopy-> RML mucous plug   Significant Diagnostic Tests:  6/11 CTH >> There is hemorrhage in the dependent portion of each lateral ventricle of uncertain etiology. No brain parenchymal hemorrhage elsewhere noted. No mass. No extra-axial fluid.  There is mild atrophy with extensive periventricular small vessel disease. No acute appearing infarct is evident.  There are multiple foci of arterial vascular calcification. There is mucosal thickening in several ethmoid air cells.  6/11 RUQ limited US >> Gallbladder sludge and gallstones without complicating factors.  Right renal cysts stable from prior CT examination.  6/12 CTH >> unchanged [name] layering within the occipital horns of the lateral ventricles  6/13 CTH >> 1. Increased but still small volume intraventricular hemorrhage layering in the occipital horns of the lateral ventricles. 2. Borderline increase in ventriculomegaly, recommend follow-up.  6/13 TTE >> LVEF 60-65%, mod LVH, no wall motion abnormality, W0JW, RV systolic function normal, mild LA dilation, post TAVR, mild dilation of aortic root  6/14 CTH >>  Stable hemorrhage layering in each occipital horn of the lateral ventricles, stable. No hemorrhage appreciable. There is atrophy with extensive supratentorial small vessel disease, stable.  No acute infarct  evident. There is extensive arterial vascular calcification at multiple sites. There is mucosal thickening in multiple ethmoid air cells.  6/16 CTH >> Stable intraventricular hemorrhage. Small amount of subarachnoid hemorrhage on the left also stable. No new hemorrhage or infarct Mild ventricular enlargement is stable  6/17 CTH >>  Stable exam with small amount of bilateral intraventricular hemorrhage and subarachnoid overlying L parietal lobe.  No new hemorrhage   6/17 Renal US: no hydronephrosis. Increased cortical echogenicity of bilateral kidneys. Bilateral renal cysts. L cyst arising form inferior pole and complicated of internal areas with increased echogenicity   6/23: CXR BL congestion   Micro Data:  6/11 SARS 2 >> neg 6/15 MRSA PCR >> neg 6/15 BCx2 >> ngtd >> 6/17 BAL  >> nml flora  Antimicrobials:  6/13 azithro >> 6/14 6/13 ceftriaxone >> 6/14 6/14 unasyn >> 6/16 6/17 vanc >>6/18 6/17 cefepime >> 6/17 flagyl >>6/18  Interim history/subjective:  Put back on precedex overnight while on BiPAP due to reported agitation.  Remains on BiPAP this morning. I am discontinuing dexmet.   Objective   Blood pressure (!) 144/80, pulse 62, temperature (!) 97.2 F (36.2 C), temperature source Axillary, resp. rate (!) 23, height _0  (1.753 m), weight 134.5 kg, SpO2 98 %.    Vent Mode: BIPAP;PCV FiO2 (%):  [50 %-100 %] 50 % Set Rate:  [10 bmp] 10 bmp PEEP:  [7 cmH20] 7 cmH20   Intake/Output Summary (Last 24 hours) at 02/08/2020 0804 Last data filed at 02/08/2020 0400 Gross per 24 hour  Intake 3374.33 ml  Output 1475 ml  Net 1899.33 ml   Filed Weights   02/04/20 0500 02/06/20 0500 02/07/20 0500  Weight: 133.5 kg 135 kg 134.5 kg    Examination: General: Obese, chronically ill appearing older adult M, on BiPAP, reclined in bed NAD  HEENT: NCAT pink tacky mm cortrak secure via bridle. BiPAP in place with good seal  Neuro: Drowsy, on precedex. Following commands but falling  asleep easily. CAM ICU+ CV: RRR distant heart sounds. Cap refill < 3 seconds BUE.  PULM: Diminished bibasilar sounds. No crackles, no wheezing. Symmetrical chest expansion with even unlabored effort on BiPAP  GI: obese soft, nt nd  Extremities: Amputation of L forefoot. Non-pitting extremity edema. No cyanosis or clubbing  Skin: c/d/w/i without rash   Resolved Hospital Problem list    Assessment & Plan:   Acute enchephalopathy -likely multifactorial in setting of hypoxia/hypercarpia,  uremia, infection, CNS depressing medications -CT H stable. -Now likely component of ICU delirium  P - delirium precautions - Dc precedex, PRN fentanyl  - promote sleep/wake cycles   [name] - small, bilateral, stable  -suspected etiology falls vs spontaneous  P:  - supportive care    Acute hypoxic and hypercarbic respiratory failure requiring intubation in setting of: -aspiration PNA, mucous plugging, pulmonary edema. Morbid obesity may be contributing to degree-- baseline hypercarbia / OHS -has had trach/peg in past  P:  - QHS and PRN BiPAP  - ongoing Felicity talks to clarify wishes if patient were to decompensate from respiratory perspective   - realistically, we need to determine if patient/family would want trach again. If patient is not amenable to trach, reintubation (if clinical decline) would likely not be of very high yield  - PRN CXR, PRN ABG   Sepsis, likely aspiration PNA -s/p 8 days of various abx; 5d course cefepime  P:  - off abx  - observe   HTN Acute on  chronic diastolic HF P:  - continue BP meds  - holding diuresis in setting of azotemia below   AKI on CKD stage IV -bilateral renal cysts. L side arising from inferior pole, complicated by internal areas on increased echogenicity which may reflect septations  -Azotemia  P:  -nephrology consulted 6/24: increased FWF, rec holding diuretic, changing EN to nepro or similar  - OP MRI for further eval of cysts at some point.  -  holding diuretic 6/25 - trend renal indices, uop   Hypernatremia- Hypokalemia, mild  P - holding diuresis today  -FWF 400 ml  q2hr  -replacing K   DM2, poorly controlled P:  - levemir, ssi and cbgs  Goal 140-180  Morbid obesity with BMI 38 - baseline OHS  P - BIPAP qhs and naps   Goals of Care: P - continue with family, appreciate palliative assisting in navigating these decisions  - full measures at this time, per [facility] note 6/24 family plans to come to bedside to see patient's condition and further discuss.    Best practice:  Diet: EN per RDN via cortrak (nepro, carb steady) Pain/Anxiety/Delirium protocol (if indicated): na VAP protocol (if indicated): na DVT prophylaxis: heparin SQ GI prophylaxis: PPI Glucose control: as above Mobility: BR Code Status: Full. Family Communication:  Pending 6/25. Appreciate PCM assistance in clarifying GOC  Disposition: ICU -- I am hopeful for possible transfer out of ICU, however with patient back on precedex overnight this may be delayed.   CRITICAL CARE Performed by: Cristal Generous   Total critical care time: 35 minutes  Critical care time was exclusive of separately billable procedures and treating other patients. Critical care was necessary to treat or prevent imminent or life-threatening deterioration.  Critical care was time spent personally by me on the following activities: development of treatment plan with patient and/or surrogate as well as nursing, discussions with consultants, evaluation of patient's response to treatment, examination of patient, obtaining history from patient or surrogate, ordering and performing treatments and interventions, ordering and review of laboratory studies, ordering and review of radiographic studies, pulse oximetry and re-evaluation of patient's condition.   Eliseo Gum MSN, AGACNP-BC [place] [phone] If no answer, [phone] 02/08/2020, 8:04  AM   PCCM:  74 yo [name], multiple medical comorbities, sepsis, ckd. Extubated but still confused. Not ready to go floor. Needs obs in ICU. pallative care discussions underway. If DNR decided would recommend move to floor.   BP (!) 143/78   Pulse 66   Temp 97.8 F (36.6 C) (Axillary)   Resp (!) 35   Ht _0  (1.753 m) Comment: measured x 3  Wt 134.5 kg   SpO2 92%   BMI 43.79 kg/m   Gen: confused, laying in bed  HENT: NCAT  Heart: RRR s1 s2 Lungs: diminished BL, no wheeze  Abd: soft, nt   Labs reviewed  A:  [name] Acute metabolic encephalopathy  AHRF  AKI on CKD  DMII  P: Delirium precautions  BIPAP PRN  Continue GOC discussions with family pallaitive care team planning meeting on 02/09/2020 Would like to avoid reintubation  Observe off abx Appreciate nephrology recs   Garner Nash, DO [facility] 02/08/2020 2:36 PM

## 2020-02-08 NOTE — Progress Notes (Signed)
Occupational Therapy Treatment Patient Details Name: [name] MRN: [id number] DOB: 1945/12/01 Today's Date: 02/08/2020    History of present illness Pt is a 74 y.o. male with PMH of HTN, PAF, SSS, CHF, tachycardia, Heart failure, cerebral artery occlusion with cerebral infarction, OSA, chronic obstructive lung disease, pulmonary edema, DM, type II, CVA, DJD, stage 3 chronic kidney disease, AKI, Gout, depression, blindness, coarse tremors, CAD, L mid foot amputation, amd pacemaker. Pt sent to ED from outpatient imaging when pt had syncope episode, urinated on self, and became confused. CT reveals stable hemorrhage layering in each occipital horn of the lateral ventricles, stable. Pt intubated on 6/17 and extubated on 6/24.   OT comments  Pt with very poor activity tolerance this date.  He repeatedly attempted to remove venturi mask and required max cues to avoid doing so.  He maintained sats 87-94% on 55% Fi02 and tolerated chair position x ~10 mins.  He follows one step commands inconsistently.  Will continue to follow.   Follow Up Recommendations  SNF;Supervision/Assistance - 24 hour    Equipment Recommendations  None recommended by OT    Recommendations for Other Services      Precautions / Restrictions Precautions Precautions: Fall Precaution Comments: monitor SpO2; low vision from MD       Mobility Bed Mobility                  Transfers                      Balance                                           ADL either performed or assessed with clinical judgement   ADL                                         General ADL Comments: Pt unable to sustain attention to participate in any ADL tasks      Vision       Perception     Praxis      Cognition Arousal/Alertness: Awake/alert Behavior During Therapy: Impulsive;Flat affect Overall Cognitive Status: Impaired/Different from baseline Area of Impairment:  Attention;Following commands                   Current Attention Level: Focused   Following Commands: Follows one step commands inconsistently       General Comments: Pt will follow commands to avoid pulling off 02, but follows no other commands         Exercises Other Exercises Other Exercises: Pt instructed in deep breathing.  He was moved into chair position with improvement of sats to 91%-94%    Shoulder Instructions       General Comments Pt on 55% Fi02 via venturi mask.  Upon OTs entrance, he had pulled mask off.  Sat probe reattached, and 02 sats 67%.  He quickly improved to 82%, but required increased time to increase sats into the high 80s.  Pt repeatedly attempted to pull off mask and requires max cues to avoid doing so.      Pertinent Vitals/ Pain       Pain Assessment: Faces Faces Pain Scale: Hurts little more Pain Descriptors / Indicators: Discomfort;Restless Pain Intervention(s): Monitored  during session  Home Living                                          Prior Functioning/Environment              Frequency  Min 2X/week        Progress Toward Goals  OT Goals(current goals can now be found in the care plan section)  Progress towards OT goals: Not progressing toward goals - comment     Plan Discharge plan remains appropriate    Co-evaluation                 AM-PAC OT "6 Clicks" Daily Activity     Outcome Measure   Help from another person eating meals?: Total Help from another person taking care of personal grooming?: Total Help from another person toileting, which includes using toliet, bedpan, or urinal?: Total Help from another person bathing (including washing, rinsing, drying)?: Total Help from another person to put on and taking off regular upper body clothing?: Total Help from another person to put on and taking off regular lower body clothing?: Total 6 Click Score: 6    End of Session Equipment  Utilized During Treatment: Oxygen  OT Visit Diagnosis: Other symptoms and signs involving cognitive function;Other symptoms and signs involving the nervous system (R29.898);Low vision, both eyes (H54.2);Apraxia (R48.2);Muscle weakness (generalized) (M62.81)   Activity Tolerance Patient limited by lethargy;Other (comment) (decreased respiratory sats )   Patient Left in bed;with call bell/phone within reach   Nurse Communication Other (comment) (02 sats and activity )        Time: 8588-5027 OT Time Calculation (min): 20 min  Charges: OT General Charges $OT Visit: 1 Visit OT Treatments $Therapeutic Activity: 8-22 mins  Nilsa Nutting., OTR/L Acute Rehabilitation Services Pager [phone] Office [place], [place] 02/08/2020, 12:37 PM

## 2020-02-09 DIAGNOSIS — J9600 Acute respiratory failure, unspecified whether with hypoxia or hypercapnia: Secondary | ICD-10-CM

## 2020-02-09 LAB — GLUCOSE, CAPILLARY
Glucose-Capillary: 179 mg/dL — ABNORMAL HIGH (ref 70–99)
Glucose-Capillary: 135 mg/dL — ABNORMAL HIGH (ref 70–99)
Glucose-Capillary: 163 mg/dL — ABNORMAL HIGH (ref 70–99)
Glucose-Capillary: 188 mg/dL — ABNORMAL HIGH (ref 70–99)
Glucose-Capillary: 163 mg/dL — ABNORMAL HIGH (ref 70–99)
Glucose-Capillary: 150 mg/dL — ABNORMAL HIGH (ref 70–99)
Glucose-Capillary: 184 mg/dL — ABNORMAL HIGH (ref 70–99)
Glucose-Capillary: 165 mg/dL — ABNORMAL HIGH (ref 70–99)
Glucose-Capillary: 159 mg/dL — ABNORMAL HIGH (ref 70–99)
Glucose-Capillary: 170 mg/dL — ABNORMAL HIGH (ref 70–99)

## 2020-02-09 LAB — CBC
HCT: 36.5 % — ABNORMAL LOW (ref 39.0–52.0)
Hemoglobin: 10.5 g/dL — ABNORMAL LOW (ref 13.0–17.0)
MCH: 24.1 pg — ABNORMAL LOW (ref 26.0–34.0)
MCHC: 28.8 g/dL — ABNORMAL LOW (ref 30.0–36.0)
MCV: 83.9 fL (ref 80.0–100.0)
Platelets: 121 10*3/uL — ABNORMAL LOW (ref 150–400)
RBC: 4.35 MIL/uL (ref 4.22–5.81)
RDW: 17.6 % — ABNORMAL HIGH (ref 11.5–15.5)
WBC: 14.3 10*3/uL — ABNORMAL HIGH (ref 4.0–10.5)
nRBC: 0.5 % — ABNORMAL HIGH (ref 0.0–0.2)

## 2020-02-09 LAB — BASIC METABOLIC PANEL
Anion gap: 14 (ref 5–15)
BUN: 127 mg/dL — ABNORMAL HIGH (ref 8–23)
CO2: 25 mmol/L (ref 22–32)
Calcium: 9.3 mg/dL (ref 8.9–10.3)
Chloride: 105 mmol/L (ref 98–111)
Creatinine, Ser: 2.25 mg/dL — ABNORMAL HIGH (ref 0.61–1.24)
GFR calc Af Amer: 33 mL/min — ABNORMAL LOW (ref >60)
GFR calc non Af Amer: 28 mL/min — ABNORMAL LOW (ref >60)
Glucose, Bld: 175 mg/dL — ABNORMAL HIGH (ref 70–99)
Potassium: 3.6 mmol/L (ref 3.5–5.1)
Sodium: 144 mmol/L (ref 135–145)

## 2020-02-09 MED ORDER — FREE WATER
400.0000 mL | Status: DC
  Administered 2020-02-09 – 2020-02-13 (×22): 400 mL

## 2020-02-09 MED ORDER — HYDRALAZINE HCL 20 MG/ML IJ SOLN
10.0000 mg | INTRAMUSCULAR | Status: DC | PRN
  Administered 2020-02-09 – 2020-02-23 (×14): 10 mg via INTRAVENOUS
  Filled 2020-02-09 (×17): qty 1

## 2020-02-09 NOTE — Progress Notes (Signed)
Family member Pesach Frisch has asked me for a work note. She has been attending to her hospitalized family member and should be excused from work as needed for the duration of hospitalization.  Bonna Gains, MD PhD 02/09/20

## 2020-02-09 NOTE — Progress Notes (Signed)
Pt appears to currently be resting comfortably on bipap, no distress noted.

## 2020-02-09 NOTE — Progress Notes (Signed)
[facility] Progress Note    Assessment/ Plan:   1.  Azotemia: likely multifactorial with catabolic state, TF, and aggressive diuresis.  I agree with holding diuresis and giving back some more free water.  Initially on 400 q 2 FWF, will back off to 400 q 4 with increased O2 requirement. CXR with pulm edema 6/23 so don't want to add D5W on top of that and compromise respiratory status more.  have switched to Nepro.  Slowly improving.  He is a poor candidate for dialysis.  If needed for respiratory status IV Lasix can be used knowing that it will likely drive up Na and BUN     2.  Hypernatremia: as above, has free water deficit and likely osmotic diuresis component from TF.  As above.  UOP not consistent with DI  3.  CKD: Renal US negative for obstruction 6/17, some acquired cysts, appears to be at baseline  4.  [name]: per PCCM  5.  Acute hypoxic RF: extubated 6/24-> looks tenuous but since has already had prior trach/ PEG hopefully he will fly.  MS is still a concern.  Greatly appreciate palliative care discussions.   6.  Dispo: pending  Subjective:    Na better, BUN coming down.  O2 requirement up however.     Objective:   BP (!) 150/112   Pulse (!) 107   Temp 98.1 F (36.7 C) (Axillary)   Resp (!) 21   Ht 5\' 9"  (1.753 m) Comment: measured x 3  Wt 134.5 kg   SpO2 97%   BMI 43.79 kg/m   Intake/Output Summary (Last 24 hours) at 02/09/2020 1043 Last data filed at 02/09/2020 0700 Gross per 24 hour  Intake 1342.9 ml  Output 1100 ml  Net 242.9 ml   Weight change:   Physical Exam: GEN: eyes closed, BiPaP HEENT sclerae anicteric NECK no JVD PULM clear anteriorly, muffled posterior bases CV RRR ABD obese EXT no LE edema, s/p L TMT NEURO responds to voice but not AAO   Imaging: No results found.  Labs: BMET Recent Labs  Lab 02/03/20 0257 02/03/20 0257 02/04/20 8101 02/04/20 0512 02/05/20 0649 02/06/20 0637 02/06/20 1742 02/07/20 0002  02/07/20 2236 02/08/20 0425 02/09/20 0620  NA 155*   < > 157*   < > 154* 156* 153* 152* 150* 148* 144  K 5.0   < > 3.9   < > 3.8 4.2 3.9 3.8 3.4* 3.4* 3.6  CL 118*   < > 121*   < > 117* 116* 113* 112* 110 110 105  CO2 26   < > 26   < > 24 28 26 26 27 28 25   GLUCOSE 149*   < > 146*   < > 163* 100* 161* 182* 157* 165* 175*  BUN 130*   < > 123*   < > 130* 134* 137* 141* 136* 137* 127*  CREATININE 2.80*   < > 2.59*   < > 2.67* 2.59* 2.52* 2.54* 2.61* 2.52* 2.25*  CALCIUM 9.0   < > 9.2   < > 9.2 9.5 9.6 9.6 9.3 9.2 9.3  PHOS 3.1  --  3.4  --  3.4  --   --   --   --   --   --    < > = values in this interval not displayed.   CBC Recent Labs  Lab 02/06/20 0637 02/07/20 0002 02/08/20 0425 02/09/20 0620  WBC 12.1* 12.0* 13.9* 14.3*  HGB 10.7* 11.0* 10.6*  10.5*  HCT 38.1* 38.9* 37.0* 36.5*  MCV 84.7 83.1 83.7 83.9  PLT 119* 117* 129* 121*    Medications:    . amLODipine  10 mg Per Tube Daily  . chlorhexidine  15 mL Mouth Rinse BID  . Chlorhexidine Gluconate Cloth  6 each Topical Daily  . docusate  100 mg Per Tube BID  . famotidine  20 mg Per Tube Daily  . feeding supplement (PRO-STAT SUGAR FREE 64)  60 mL Per Tube BID  . free water  400 mL Per Tube Q2H  . heparin injection (subcutaneous)  5,000 Units Subcutaneous Q8H  . hydrALAZINE  50 mg Per Tube Q8H  . insulin aspart  0-20 Units Subcutaneous Q4H  . insulin aspart  4 Units Subcutaneous Q4H  . insulin detemir  60 Units Subcutaneous BID  . ipratropium-albuterol  3 mL Nebulization TID  . mouth rinse  15 mL Mouth Rinse q12n4p  . metoprolol tartrate  50 mg Per Tube BID  . polyethylene glycol  17 g Per Tube Daily  . sodium chloride flush  3 mL Intravenous Once      Madelon Lips MD 02/09/2020, 10:43 AM

## 2020-02-09 NOTE — Progress Notes (Addendum)
Daily Progress Note   Patient Name: [name]       Date: 02/09/2020 DOB: 1946/07/17  Age: 74 y.o. MRN#: [id number] Attending Physician: Garner Nash, DO Primary Care Physician: [name], MD Admit Date: 01/25/2020  Reason for Consultation/Follow-up: Establishing goals of care  Subjective: Patient minimally responsive to stimuli. Remains on BiPAP.    BiPAP wean was attempted earlier and he de-sated quickly into 70's.   GOC:  AM: As requested by Vinie Sill PMT NP, PMT provider f/u this AM. Call placed to daughter, [name] to arrange time for family meeting this afternoon. No answer. VM left.  Received page from RN with update this afternoon. Appreciate Dr. Maryjean Ka and his conversation with multiple children this morning including [name] and [name] (reported decision-makers). Per RN, [name] at bedside and [name] is on the way. They were requesting to f/u with PMT provider this afternoon.   Upon arrival to unit, no family at bedside. This NP spoke with daughter, [name] via telephone for requested follow-up. [name] appreciative of conversation with Dr. Maryjean Ka and detailed review of her father's condition. She tells me no decisions were made and they wish to "proceed with current care."   [name] shares that it is hard to get the children together until evening and 6:30 Monday would be a good time to meet and discuss care plan. Explained that PMT is unavailable this late in the evening and importance of trying to arrange a meeting earlier in the afternoon. [name] will continue to discuss with her family. Also explained that we can do conference call/video call again if that is the only time all children could be available. [name] confirmed that [name] is on the way to [facility]  and should be there at anytime.   Unfortunately [name] did not arrive at bedside while this NP was waiting on the unit. Reassured RN that PMT will continue to follow and meet/conference with this family when available.    Length of Stay: 15  Current Medications: Scheduled Meds:  . amLODipine  10 mg Per Tube Daily  . chlorhexidine  15 mL Mouth Rinse BID  . Chlorhexidine Gluconate Cloth  6 each Topical Daily  . docusate  100 mg Per Tube BID  . famotidine  20 mg Per Tube Daily  . feeding supplement (PRO-STAT SUGAR FREE 64)  60 mL Per Tube BID  . free water  400 mL Per Tube Q4H  . heparin injection (subcutaneous)  5,000 Units Subcutaneous Q8H  . hydrALAZINE  50 mg Per Tube Q8H  . insulin aspart  0-20 Units Subcutaneous Q4H  . insulin aspart  4 Units Subcutaneous Q4H  . insulin detemir  60 Units Subcutaneous BID  . ipratropium-albuterol  3 mL Nebulization TID  . mouth rinse  15 mL Mouth Rinse q12n4p  . metoprolol tartrate  50 mg Per Tube BID  . polyethylene glycol  17 g Per Tube Daily  . sodium chloride flush  3 mL Intravenous Once    Continuous Infusions: . sodium chloride Stopped (02/04/20 0129)  . feeding supplement (NEPRO CARB STEADY) 42 mL/hr at 02/09/20 1700    PRN Meds: sodium chloride, acetaminophen (TYLENOL) oral liquid 160 mg/5 mL, hydrALAZINE, labetalol, polyethylene glycol  Physical Exam Vitals and nursing note reviewed.  Cardiovascular:     Rate and Rhythm: Normal rate.  Pulmonary:     Effort: No tachypnea, accessory muscle usage or respiratory distress.     Breath sounds: Decreased air movement present.  Neurological:     Mental Status: He is lethargic.     Comments: Minimally responsive            Vital Signs: BP (!) 151/81   Pulse 63   Temp 98 F (36.7 C) (Axillary)   Resp 19   Ht 5\' 9"  (1.753 m) Comment: measured x 3  Wt 134.5 kg   SpO2 98%   BMI 43.79 kg/m  SpO2: SpO2: 98 % O2 Device: O2 Device: Bi-PAP O2 Flow Rate: O2 Flow Rate (L/min): 14  L/min  Intake/output summary:   Intake/Output Summary (Last 24 hours) at 02/09/2020 1733 Last data filed at 02/09/2020 1600 Gross per 24 hour  Intake 1663.1 ml  Output 1350 ml  Net 313.1 ml   LBM: Last BM Date: 02/05/20 Baseline Weight: Weight: (!) 149.7 kg Most recent weight: Weight: 134.5 kg       Palliative Assessment/Data: PPS 30%    Flowsheet Rows     Most Recent Value  Intake Tab  Referral Department --  [imts]  Unit at Time of Referral ICU  Palliative Care Primary Diagnosis Neurology  Date Notified 01/29/20  Palliative Care Type New Palliative care  Reason for referral Clarify Goals of Care  Date of Admission 01/25/20  Date first seen by Palliative Care 01/29/20  # of days Palliative referral response time 0 Day(s)  # of days IP prior to Palliative referral 4  Clinical Assessment  Psychosocial & Spiritual Assessment  Palliative Care Outcomes      Patient Active Problem List   Diagnosis Date Noted  . Acute respiratory failure ([place])   . Aspiration into airway   . Chronic respiratory failure with hypoxia and hypercapnia (HCC)   . Acute on chronic respiratory failure with hypoxia and hypercapnia (HCC)   . Goals of care, counseling/discussion   . Palliative care by specialist   . Acute diastolic CHF (congestive heart failure) ([place]) 04/28/2019  . Acute on chronic congestive heart failure ([place])   . Heart failure ([place]) 08/31/2018  . Paroxysmal atrial tachycardia ([place]) 05/12/2018  . Aortic valve stenosis, nonrheumatic 05/12/2018  . Diabetes mellitus type 2 in obese ([place]) 05/12/2018  . Chronic diastolic heart failure ([place]) 11/11/2017  . Hypernatremia 08/15/2017  . AKI (acute kidney injury) ([place]) 08/15/2017  . Acute lower UTI 08/15/2017  . Coarse tremors 08/15/2017  . Amiodarone pulmonary  toxicity   . Cellulitis   . Acute encephalopathy   . Acute pulmonary edema (HCC)   . Acute respiratory failure with hypoxia ([place]) 06/13/2017  . SSS (sick sinus syndrome)  ([place]) 05/19/2016  . Cough 08/20/2015  . Morbid obesity ([place]) 07/09/2015  . Cardiac mass 10/04/2013  . Intraventricular hemorrhage ([place]) 10/02/2013  . CVA (cerebral infarction) 10/01/2013  . History of intracranial hemorrhage 10/01/2013  . ICH (intracerebral hemorrhage) ([place]) 10/01/2013  . Right knee pain 09/07/2013  . Stage 3b chronic kidney disease 09/07/2013  . Orchitis 09/05/2013  . Unspecified protein-calorie malnutrition ([place]) 09/05/2013  . Sepsis ([place]) 08/27/2013  . Trichomonas infection 08/27/2013  . Epididymo-orchitis, acute 08/27/2013  . PAF (paroxysmal atrial fibrillation) ([place]) 06/24/2013  . OSA (obstructive sleep apnea) 05/04/2011  . Shortness of breath 03/17/2011  . Depression 10/23/2010  . Degenerative joint disease   . Cardiomyopathy, hypertensive ([place])   . Coronary artery disease involving native coronary artery of native heart without angina pectoris   . Pacemaker   . Incarcerated ventral hernia   . COPD (chronic obstructive pulmonary disease) ([place]) 11/28/2009  . Diabetes mellitus due to underlying condition, controlled, with stage 3 chronic kidney disease, with long-term current use of insulin ([place]) 07/20/2006  . Hypercholesterolemia 07/20/2006  . GOUT 07/20/2006  . Macular degeneration (senile) of retina 07/20/2006  . Essential hypertension 07/20/2006  . BENIGN PROSTATIC HYPERTROPHY, HX OF 07/20/2006    Palliative Care Assessment & Plan   Patient Profile: 74 y.o. male  with past medical history of stroke requiring trach/PEG previously, HFpEF, atrial fibrillation, severe aortic stenosis s/p TAVR, CKD stage IV, hypertension, OHS/OSA uses CPAP, diabetes, s/p left foot amputation admitted on 01/25/2020 with ongoing headache and CT head bilateral intraventricular hemorrhage. Hospitalization complicated by ongoing poor mental status, aspiration pneumonia, hypertension, and concern for airway protection. Extubated on 6/24 but respiratory status remains tenuous. Patient  on BiPAP with desaturation when BiPAP wean attempted. Not a candidate for dialysis. Cognitive status remains poor.   Assessment: Acute encephalopathy, multifactorial [name], small/bilateral stable Acute hypoxic and hypercarbic respiratory failure Aspiration pneumonia ? OHS with obesity Sepsis Acute on chronic diastolic CHF  Recommendations/Plan:  Ongoing attempts by PMT to arrange family meeting to determine goals.   Appreciate Dr. Maryjean Ka speaking with family this AM. PMT provider attempted to follow-up this afternoon and family unavailable. [name] left bedside and [name] did not show while this NP waiting.   Continue FULL code and current plan of care.   PMT will continue to follow.   Goals of Care and Additional Recommendations:  Limitations on Scope of Treatment: Full Scope Treatment  Code Status: FULL   Code Status Orders  (From admission, onward)         Start     Ordered   01/25/20 2138  Full code  Continuous        01/25/20 2138        Code Status History    Date Active Date Inactive Code Status Order ID Comments User Context   04/28/2019 1812 05/11/2019 1931 Full Code [id number]  Sueanne Margarita, MD Inpatient   04/10/2019 1315 04/10/2019 1850 Full Code [id number]  [name], MD Inpatient   08/31/2018 1951 09/08/2018 1939 Full Code [id number]  Tawni Millers, MD Inpatient   08/15/2017 1823 08/17/2017 2037 Full Code [id number]  Marene Lenz, MD ED   06/13/2017 1316 06/25/2017 0102 Full Code [id number]  Donita Brooks, NP ED   10/16/2013 1744 11/15/2013 1956 Full Code [id number]  Ermalinda Memos Inpatient   10/01/2013 1845 10/16/2013 1744 Full Code [id number]  Alexis Goodell, MD Inpatient   09/05/2013 1733 09/07/2013 2101 Full Code [id number]  Cresenciano Genre Inpatient   08/27/2013 2223 08/29/2013 2010 Full Code [id number]  Blain Pais, MD Inpatient   06/22/2013 1745 06/24/2013 1936 Full Code [id number]  Newt Minion, MD Inpatient   Advance Care Planning  Activity       Prognosis:   Poor  Discharge Planning:  To Be Determined  Care plan was discussed with RN, daughter Veterinary surgeon), VM left for daughter [name])  Thank you for allowing the Palliative Medicine Team to assist in the care of this patient.   Time In: 1430 Time Out: 1500 Total Time 30 Prolonged Time Billed  no      Greater than 50%  of this time was spent counseling and coordinating care related to the above assessment and plan.  Ihor Dow, DNP, FNP-C Palliative Medicine Team  Phone: [phone] Fax: [phone]  Please contact Palliative Medicine Team phone at [phone] for questions and concerns.

## 2020-02-09 NOTE — Progress Notes (Signed)
[facility] Progress Note Patient Name: [name] DOB: [birth year] MRN: [id number]   Date of Service  02/09/2020  HPI/Events of Note  Hypertension - BP = 170/88. Has Labetalol IV PRN, however, HR = 56.  eICU Interventions  Plan: 1. Hydralazine 10 mg IV Q 4 hours PRN SBP > 160 or DBP > 100.      Intervention Category Major Interventions: Hypertension - evaluation and management;Arrhythmia - evaluation and management  Shanyn Preisler [place] 02/09/2020, 12:58 AM

## 2020-02-09 NOTE — Progress Notes (Signed)
Attempted to trial off bipap, trial lasted approx 3-4  Minutes on 55% VM. Pt did not tol being off bipap d/t increased WOB, and desat to 80% on 55% VM.  RN was in the room during trial, pt was immediately placed back on bipap. Sat now 98%, no increased WOB currently noted in bipap.

## 2020-02-09 NOTE — Progress Notes (Signed)
NAME:  [name], MRN:  [id number], DOB:  November 16, 1945, LOS: 17 ADMISSION DATE:  01/25/2020, CONSULTATION DATE:  01/31/2020 REFERRING MD:  Graciella Freer, CHIEF COMPLAINT:  Worsening AMS/ hypoxia  Brief History   74 yoM admitted 6/11 with small bilateral [name] requiring intermittent cleviprex for strict BP control, with hospitalization complicated by likely aspiration pneumonia, AKI on CKD, and worsening mental status  and desaturation episodes with increased O2 needs.  Mental status prevents BiPAP.  Intubated for hypercarbic respiratory failure  History of present illness   HPI obtained from medical chart review as patient is unresponsive.   74 year old male with prior extensive medical history significant for blindness, prior ICH with previous trach/ PEG with some baseline aphasia, HFpEF, AS s/p TAVR 04/2019, afib not on AC due to GI bleeding and ICH, CAD, OSA, CKD stage IV, HTN, morbid obesity, and poorly controlled DMT2 with neuropathy who presented 6/11 with 3 day history of bifrontal headache, recent falls 2-3 weeks prior, increasing confusion/ syncope and nausea/ vomiting.  Found on workup to have small bilateral intraventricular hemorrhage of unclear etiology, suspected to be traumatic given fall history vs spontaneous.   MRI unable to be obtained given pacemaker.  Admitted to IMTS with neurology consulting.  Required intermittent cleviprex gtt for strict BP control. Hospitalization complicated by suspected aspiration pneumonia on 6/13 and started empirically on antibiotics, and worsening AKI on CKD, and progressive decline in mental status.  Serial imaging mostly stable.  Palliative care consulted to help with goals of care and Overnight, 6/16, he had worsening mental status , increased O2 needs, and desaturations. ABG obtained showed 7.306/ 68/ 72/ 33.5.   PCCM consulted for further management of respiratory failure.   Past Medical History  Blindness, OSA, prior ICH with previous trach/ PEG, HFpEF,  afib, AS s/p TAVR 04/2019, CAD, CKD stage IV, HTN, DMT2 poorly controlled with neuropathy, morbid obesity, HLD, GERD, bradycardia/ LBBB with pacemaker,   Significant Hospital Events   6/11 admitted IMTS to ICU, intermittently requiring cleviprex 6/13 concern for aspiration, abx started 6/15 requiring cleviprex gtt 6/17 worsening mental status/ hypoxia- intubated 6/24 extubated to BiPAP 6/25 started on dexmet overnight for reported agitation. Remains on BiPAP. Discontinuing BiPAP during day shift   Consults:  Neurology 6/11 Palliative care 6/15 PCCM 6/17  Procedures:  6/14 right nare cortrak >> 6/17 ETT >> 6/17 bronchoscopy-> RML mucous plug   Significant Diagnostic Tests:  6/11 CTH >> There is hemorrhage in the dependent portion of each lateral ventricle of uncertain etiology. No brain parenchymal hemorrhage elsewhere noted. No mass. No extra-axial fluid.  There is mild atrophy with extensive periventricular small vessel disease. No acute appearing infarct is evident.  There are multiple foci of arterial vascular calcification. There is mucosal thickening in several ethmoid air cells.  6/11 RUQ limited US >> Gallbladder sludge and gallstones without complicating factors.  Right renal cysts stable from prior CT examination.  6/12 CTH >> unchanged [name] layering within the occipital horns of the lateral ventricles  6/13 CTH >> 1. Increased but still small volume intraventricular hemorrhage layering in the occipital horns of the lateral ventricles. 2. Borderline increase in ventriculomegaly, recommend follow-up.  6/13 TTE >> LVEF 60-65%, mod LVH, no wall motion abnormality, W4XL, RV systolic function normal, mild LA dilation, post TAVR, mild dilation of aortic root  6/14 CTH >>  Stable hemorrhage layering in each occipital horn of the lateral ventricles, stable. No hemorrhage appreciable. There is atrophy with extensive supratentorial small vessel disease, stable.  No acute infarct  evident. There is extensive arterial vascular calcification at multiple sites. There is mucosal thickening in multiple ethmoid air cells.  6/16 CTH >> Stable intraventricular hemorrhage. Small amount of subarachnoid hemorrhage on the left also stable. No new hemorrhage or infarct Mild ventricular enlargement is stable  6/17 CTH >>  Stable exam with small amount of bilateral intraventricular hemorrhage and subarachnoid overlying L parietal lobe.  No new hemorrhage   6/17 Renal US: no hydronephrosis. Increased cortical echogenicity of bilateral kidneys. Bilateral renal cysts. L cyst arising form inferior pole and complicated of internal areas with increased echogenicity   6/23: CXR BL congestion   Micro Data:  6/11 SARS 2 >> neg 6/15 MRSA PCR >> neg 6/15 BCx2 >> ngtd >> 6/17 BAL  >> nml flora  Antimicrobials:  6/13 azithro >> 6/14 6/13 ceftriaxone >> 6/14 6/14 unasyn >> 6/16 6/17 vanc >>6/18 6/17 cefepime >> 6/17 flagyl >>6/18  Interim history/subjective:  Did not tolerate trial off BiPAP this morning. Decreasing interactivity, LOC  Objective   Blood pressure (!) 151/79, pulse 61, temperature 98.3 F (36.8 C), temperature source Axillary, resp. rate 19, height _0  (1.753 m), weight 134.5 kg, SpO2 98 %.    Vent Mode: BIPAP;PCV FiO2 (%):  [50 %-55 %] 50 % Set Rate:  [10 bmp] 10 bmp PEEP:  [7 cmH20] 7 cmH20   Intake/Output Summary (Last 24 hours) at 02/09/2020 1259 Last data filed at 02/09/2020 1200 Gross per 24 hour  Intake 1435.1 ml  Output 1850 ml  Net -414.9 ml   Filed Weights   02/04/20 0500 02/06/20 0500 02/07/20 0500  Weight: 133.5 kg 135 kg 134.5 kg    Examination: General: Obese, chronically ill appearing older adult M, on BiPAP, reclined in bed NAD  HEENT: NCAT pink tacky mm cortrak secure via bridle. BiPAP in place with good seal  Neuro: Drowsy,poorly responsive, does not follow commands with any consistency.  CV: RRR distant heart sounds. Cap refill < 3  seconds BUE.  PULM: Diminished bibasilar sounds. No crackles, no wheezing. Symmetrical chest expansion with even unlabored effort on BiPAP  GI: obese soft, nt nd  Extremities: Amputation of L forefoot. Non-pitting extremity edema. No cyanosis or clubbing. Asterixis-like movements in hands Skin: c/d/w/i without rash   Resolved Hospital Problem list    Assessment & Plan:   Acute enchephalopathy -likely multifactorial in setting of hypoxia/hypercarpia,  uremia, infection, CNS depressing medications -CT H stable. -Now likely component of ICU delirium  P - likely uremia primarily, though mult comorbidities. D/w palliative care. I d/w family.  [name] - small, bilateral, stable  -suspected etiology falls vs spontaneous  P:  - supportive care    Acute hypoxic and hypercarbic respiratory failure requiring intubation in setting of: -aspiration PNA, mucous plugging, pulmonary edema. Morbid obesity may be contributing to degree-- baseline hypercarbia / OHS -has had trach/peg in past  P:  - QHS and PRN BiPAP  - ongoing [facility] talks to clarify wishes if patient were to decompensate from respiratory perspective   - realistically, we need to determine if patient/family would want trach again. Given other comorbidities and especially AKI/CKD reintubation (given clinical decline) would likely not be of very high yield  - PRN CXR, PRN ABG   Sepsis, likely aspiration PNA -s/p 8 days of various abx; 5d course cefepime  P:  - off abx  - observe   HTN Acute on chronic diastolic HF P:  - continue BP meds  - holding diuresis in  setting of azotemia below   AKI on CKD stage IV -bilateral renal cysts. L side arising from inferior pole, complicated by internal areas on increased echogenicity which may reflect septations  -Azotemia  P:  -nephrology consulted 6/24: not a dialysis candidate. increased FWF, rec holding diuretic, changing EN to nepro or similar  - OP MRI for further eval of cysts at some  point.  - holding diuretic 6/25 - trend renal indices, uop   Hypernatremia- Hypokalemia, mild  P - holding diuresis today  -FWF 400 ml  q2hr  -replacing K   DM2, poorly controlled P:  - levemir, ssi and cbgs  Goal 140-180  Morbid obesity with BMI 38 - baseline OHS  P - BIPAP qhs and naps   Goals of Care: P - continue with family, appreciate palliative assisting in navigating these decisions  - full measures at this time, per [facility] note 6/24 family plans to come to bedside to see patient's condition and further discuss.    Best practice:  Diet: EN per RDN via cortrak (nepro, carb steady) Pain/Anxiety/Delirium protocol (if indicated): na VAP protocol (if indicated): na DVT prophylaxis: heparin SQ GI prophylaxis: PPI Glucose control: as above Mobility: BR Code Status: Full. Family Communication: Spoke with daughter [name] in room, and family by phone. Unlikely to improve given azotemia and worsening LOC likely secondary to renal failure. Family aware, and expecting a  PCM meeting today  Disposition: ICU for now -  CRITICAL CARE Performed by: Bonna Gains    I have independently seen and examined the patient, reviewed data, and developed an assessment and plan. A total of 42 minutes were spent in critical care assessment and medical decision making. This critical care time does not reflect procedure time, or teaching time or supervisory time of PA/NP/Med student/Med Resident, etc but could involve care discussion time.  Bonna Gains, MD PhD  02/09/2020 1:07 PM

## 2020-02-10 LAB — GLUCOSE, CAPILLARY
Glucose-Capillary: 166 mg/dL — ABNORMAL HIGH (ref 70–99)
Glucose-Capillary: 188 mg/dL — ABNORMAL HIGH (ref 70–99)
Glucose-Capillary: 207 mg/dL — ABNORMAL HIGH (ref 70–99)
Glucose-Capillary: 193 mg/dL — ABNORMAL HIGH (ref 70–99)
Glucose-Capillary: 161 mg/dL — ABNORMAL HIGH (ref 70–99)
Glucose-Capillary: 179 mg/dL — ABNORMAL HIGH (ref 70–99)

## 2020-02-10 LAB — BASIC METABOLIC PANEL
Anion gap: 10 (ref 5–15)
BUN: 119 mg/dL — ABNORMAL HIGH (ref 8–23)
CO2: 27 mmol/L (ref 22–32)
Calcium: 9.2 mg/dL (ref 8.9–10.3)
Chloride: 105 mmol/L (ref 98–111)
Creatinine, Ser: 2.15 mg/dL — ABNORMAL HIGH (ref 0.61–1.24)
GFR calc Af Amer: 35 mL/min — ABNORMAL LOW (ref >60)
GFR calc non Af Amer: 30 mL/min — ABNORMAL LOW (ref >60)
Glucose, Bld: 193 mg/dL — ABNORMAL HIGH (ref 70–99)
Potassium: 3.6 mmol/L (ref 3.5–5.1)
Sodium: 142 mmol/L (ref 135–145)

## 2020-02-10 LAB — CBC
HCT: 35.9 % — ABNORMAL LOW (ref 39.0–52.0)
Hemoglobin: 10.3 g/dL — ABNORMAL LOW (ref 13.0–17.0)
MCH: 23.9 pg — ABNORMAL LOW (ref 26.0–34.0)
MCHC: 28.7 g/dL — ABNORMAL LOW (ref 30.0–36.0)
MCV: 83.3 fL (ref 80.0–100.0)
Platelets: 117 10*3/uL — ABNORMAL LOW (ref 150–400)
RBC: 4.31 MIL/uL (ref 4.22–5.81)
RDW: 17.6 % — ABNORMAL HIGH (ref 11.5–15.5)
WBC: 12.3 10*3/uL — ABNORMAL HIGH (ref 4.0–10.5)
nRBC: 0.4 % — ABNORMAL HIGH (ref 0.0–0.2)

## 2020-02-10 NOTE — Plan of Care (Signed)
  Problem: Nutrition: Goal: Adequate nutrition will be maintained Outcome: Progressing   

## 2020-02-10 NOTE — Progress Notes (Signed)
Pt developed more frequent PVC's, increased WOB and dropped sat to 89%.  Pt placed back on bipap, RN aware.   Upon placing back on bipap, noted pressure wound on nose.  RN called to assess prior to placing Mepalex dressing over it.

## 2020-02-10 NOTE — Progress Notes (Signed)
Provided excuse note for daughter [name].

## 2020-02-10 NOTE — Progress Notes (Signed)
NAME:  [name], MRN:  [id number], DOB:  06/06/1946, LOS: 77 ADMISSION DATE:  01/25/2020, CONSULTATION DATE:  01/31/2020 REFERRING MD:  Graciella Freer, CHIEF COMPLAINT:  Worsening AMS/ hypoxia  Brief History   39 yoM admitted 6/11 with small bilateral [name] requiring intermittent cleviprex for strict BP control, with hospitalization complicated by likely aspiration pneumonia, AKI on CKD, and worsening mental status  and desaturation episodes with increased O2 needs.  Mental status prevents BiPAP.  Intubated for hypercarbic respiratory failure  History of present illness   HPI obtained from medical chart review as patient is unresponsive.   74 year old male with prior extensive medical history significant for blindness, prior ICH with previous trach/ PEG with some baseline aphasia, HFpEF, AS s/p TAVR 04/2019, afib not on AC due to GI bleeding and ICH, CAD, OSA, CKD stage IV, HTN, morbid obesity, and poorly controlled DMT2 with neuropathy who presented 6/11 with 3 day history of bifrontal headache, recent falls 2-3 weeks prior, increasing confusion/ syncope and nausea/ vomiting.  Found on workup to have small bilateral intraventricular hemorrhage of unclear etiology, suspected to be traumatic given fall history vs spontaneous.   MRI unable to be obtained given pacemaker.  Admitted to IMTS with neurology consulting.  Required intermittent cleviprex gtt for strict BP control. Hospitalization complicated by suspected aspiration pneumonia on 6/13 and started empirically on antibiotics, and worsening AKI on CKD, and progressive decline in mental status.  Serial imaging mostly stable.  Palliative care consulted to help with goals of care and Overnight, 6/16, he had worsening mental status , increased O2 needs, and desaturations. ABG obtained showed 7.306/ 68/ 72/ 33.5.   PCCM consulted for further management of respiratory failure.   Past Medical History  Blindness, OSA, prior ICH with previous trach/ PEG, HFpEF,  afib, AS s/p TAVR 04/2019, CAD, CKD stage IV, HTN, DMT2 poorly controlled with neuropathy, morbid obesity, HLD, GERD, bradycardia/ LBBB with pacemaker,   Significant Hospital Events   6/11 admitted IMTS to ICU, intermittently requiring cleviprex 6/13 concern for aspiration, abx started 6/15 requiring cleviprex gtt 6/17 worsening mental status/ hypoxia- intubated 6/24 extubated to BiPAP 6/25 started on dexmet overnight for reported agitation. Remains on BiPAP. Discontinuing BiPAP during day shift   Consults:  Neurology 6/11 Palliative care 6/15 PCCM 6/17  Procedures:  6/14 right nare cortrak >> 6/17 ETT >> 6/17 bronchoscopy-> RML mucous plug   Significant Diagnostic Tests:  6/11 CTH >> There is hemorrhage in the dependent portion of each lateral ventricle of uncertain etiology. No brain parenchymal hemorrhage elsewhere noted. No mass. No extra-axial fluid.  There is mild atrophy with extensive periventricular small vessel disease. No acute appearing infarct is evident.  There are multiple foci of arterial vascular calcification. There is mucosal thickening in several ethmoid air cells.  6/11 RUQ limited US >> Gallbladder sludge and gallstones without complicating factors.  Right renal cysts stable from prior CT examination.  6/12 CTH >> unchanged [name] layering within the occipital horns of the lateral ventricles  6/13 CTH >> 1. Increased but still small volume intraventricular hemorrhage layering in the occipital horns of the lateral ventricles. 2. Borderline increase in ventriculomegaly, recommend follow-up.  6/13 TTE >> LVEF 60-65%, mod LVH, no wall motion abnormality, B8ML, RV systolic function normal, mild LA dilation, post TAVR, mild dilation of aortic root  6/14 CTH >>  Stable hemorrhage layering in each occipital horn of the lateral ventricles, stable. No hemorrhage appreciable. There is atrophy with extensive supratentorial small vessel disease, stable.  No acute infarct  evident. There is extensive arterial vascular calcification at multiple sites. There is mucosal thickening in multiple ethmoid air cells.  6/16 CTH >> Stable intraventricular hemorrhage. Small amount of subarachnoid hemorrhage on the left also stable. No new hemorrhage or infarct Mild ventricular enlargement is stable  6/17 CTH >>  Stable exam with small amount of bilateral intraventricular hemorrhage and subarachnoid overlying L parietal lobe.  No new hemorrhage   6/17 Renal US: no hydronephrosis. Increased cortical echogenicity of bilateral kidneys. Bilateral renal cysts. L cyst arising form inferior pole and complicated of internal areas with increased echogenicity   6/23: CXR BL congestion   Micro Data:  6/11 SARS 2 >> neg 6/15 MRSA PCR >> neg 6/15 BCx2 >> ngtd >> 6/17 BAL  >> nml flora  Antimicrobials:  6/13 azithro >> 6/14 6/13 ceftriaxone >> 6/14 6/14 unasyn >> 6/16 6/17 vanc >>6/18 6/17 cefepime >> 6/17 flagyl >>6/18  Interim history/subjective:   Did not tolerated off bipap this AM. Was on venti mask for <91mns. Bedside discussion with daughter.   Objective   Blood pressure (!) 149/73, pulse 65, temperature 98.1 F (36.7 C), temperature source Axillary, resp. rate (!) 21, height _0  (1.753 m), weight (!) 137.5 kg, SpO2 91 %.    Vent Mode: BIPAP;PCV FiO2 (%):  [50 %] 50 % Set Rate:  [10 bmp] 10 bmp PEEP:  [7 cmH20] 7 cmH20   Intake/Output Summary (Last 24 hours) at 02/10/2020 08416Last data filed at 02/10/2020 0741 Gross per 24 hour  Intake 2211.1 ml  Output 1575 ml  Net 636.1 ml   Filed Weights   02/06/20 0500 02/07/20 0500 02/10/20 0500  Weight: 135 kg 134.5 kg (!) 137.5 kg    Examination: General: obese, chronically ill, on BIPAP, alert to voice  HEENT: bipap in place, mepelix on nose Neuro: alert to voice  CV: RRR, s1 s2 PULM: Diminshed BL, no wheeze  GI: obese, soft, nt nd  Extremities: left forefoot amputation  Skin: no rash   Resolved  Hospital Problem list    Assessment & Plan:   Acute enchephalopathy -likely multifactorial in setting of hypoxia/hypercarpia,  uremia, infection, CNS injury  -CT H stable. -likely component of ICU delirium  P Multifactorial  Barrier to transfering from ICU  Needs to be here for close obs   [name] - small, bilateral, stable  -suspected etiology falls vs spontaneous  P:  - supportive care   Acute hypoxic and hypercarbic respiratory failure requiring intubation in setting of: -aspiration PNA, mucous plugging, pulmonary edema. Morbid obesity may be contributing to degree-- baseline hypercarbia / OHS -has had trach/peg in past  P:  - now requiring BIPAP continously  - I think he is high risk for re-intubation  - this was discussed with daugther - they are trying to make decisions as a family   Sepsis, likely aspiration PNA -s/p 8 days of various abx; 5d course cefepime  P:  - observe of abx   HTN Acute on chronic diastolic HF P:  - PRNs for bp control   AKI on CKD stage IV -bilateral renal cysts. L side arising from inferior pole, complicated by internal areas on increased echogenicity which may reflect septations  -Azotemia  P:  -follow BUN and UOP - nephrology doesn't think he is a good long term candidate for HD   Hypernatremia- Hypokalemia, mild  P Holding diuresis  Following BUN, its improving   DM2, poorly controlled P:  - levemir + SSI  Morbid obesity with BMI 38 - baseline OHS  P - BIPAP continuous at this point   Goals of Care: P - agree with ongoing palliative care discussions  - 15 mins spent at bedside with daughter today talking    Best practice:  Diet: EN per RDN via cortrak (nepro, carb steady) Pain/Anxiety/Delirium protocol (if indicated): na VAP protocol (if indicated): na DVT prophylaxis: heparin SQ GI prophylaxis: PPI Glucose control: as above Mobility: BR Code Status: Full. Family Communication: met with [name] at bedside today    Disposition: ICU for now   This patient is critically ill with multiple organ system failure; which, requires frequent high complexity decision making, assessment, support, evaluation, and titration of therapies. This was completed through the application of advanced monitoring technologies and extensive interpretation of multiple databases. During this encounter critical care time was devoted to patient care services described in this note for 48 minutes.  Garner Nash, DO [facility] 02/10/2020 9:06 AM

## 2020-02-10 NOTE — Progress Notes (Signed)
[facility] Progress Note    Assessment/ Plan:   1.  Azotemia: likely multifactorial with catabolic state, TF, and aggressive diuresis.  I agree with holding diuresis and giving back some more free water.  Initially on 400 q 2 FWF, will back off to 400 q 4 with increased O2 requirement. CXR with pulm edema 6/23 so don't want to add D5W on top of that and compromise respiratory status more.  have switched to Nepro.  Slowly improving.  He is a poor candidate for dialysis.  If needed for respiratory status IV Lasix can be used knowing that it will likely drive up Na and BUN.  Numbers are improving with decreased FWF.  Nothing further to offer- will sign off.  Call with questions.     2.  Hypernatremia: as above, has free water deficit and likely osmotic diuresis component from TF.  As above.  UOP not consistent with DI  3.  CKD: Renal US negative for obstruction 6/17, some acquired cysts, appears to be at baseline  4.  [name]: per PCCM  5.  Acute hypoxic RF: extubated 6/24-> increasingly tenuous.  Family trying to come to decision re: reintubation and trach.  Greatly appreciate palliative care discussions.   6. Acute encephalopathy- mental status not great on admission and hasn't cleared since even with improving BUN.  Multifactorial  7.  Dispo: pending  Subjective:    Numbers continue to improve, requiring continuous BiPaP.  Family trying to make decisions re: re-intubation.  Doesn't respond today.     Objective:   BP (!) 161/80   Pulse (!) 58   Temp 98.1 F (36.7 C) (Axillary)   Resp 19   Ht 5\' 9"  (1.753 m) Comment: measured x 3  Wt (!) 137.5 kg   SpO2 98%   BMI 44.76 kg/m   Intake/Output Summary (Last 24 hours) at 02/10/2020 1200 Last data filed at 02/10/2020 1100 Gross per 24 hour  Intake 2184 ml  Output 825 ml  Net 1359 ml   Weight change:   Physical Exam: GEN: eyes closed, BiPaP HEENT sclerae anicteric NECK no JVD PULM clear anteriorly, muffled  posterior bases CV RRR ABD obese EXT no LE edema, s/p L TMT NEURO Doesn't respond to voice today  Imaging: No results found.  Labs: BMET Recent Labs  Lab 02/04/20 [id number] 02/04/20 0539 02/05/20 7673 02/05/20 0649 02/06/20 4193 02/06/20 1742 02/07/20 0002 02/07/20 2236 02/08/20 0425 02/09/20 0620 02/10/20 0219  NA 157*   < > 154*   < > 156* 153* 152* 150* 148* 144 142  K 3.9   < > 3.8   < > 4.2 3.9 3.8 3.4* 3.4* 3.6 3.6  CL 121*   < > 117*   < > 116* 113* 112* 110 110 105 105  CO2 26   < > 24   < > 28 26 26 27 28 25 27   GLUCOSE 146*   < > 163*   < > 100* 161* 182* 157* 165* 175* 193*  BUN 123*   < > 130*   < > 134* 137* 141* 136* 137* 127* 119*  CREATININE 2.59*   < > 2.67*   < > 2.59* 2.52* 2.54* 2.61* 2.52* 2.25* 2.15*  CALCIUM 9.2   < > 9.2   < > 9.5 9.6 9.6 9.3 9.2 9.3 9.2  PHOS 3.4  --  3.4  --   --   --   --   --   --   --   --    < > =  values in this interval not displayed.   CBC Recent Labs  Lab 02/07/20 0002 02/08/20 0425 02/09/20 0620 02/10/20 0219  WBC 12.0* 13.9* 14.3* 12.3*  HGB 11.0* 10.6* 10.5* 10.3*  HCT 38.9* 37.0* 36.5* 35.9*  MCV 83.1 83.7 83.9 83.3  PLT 117* 129* 121* 117*    Medications:    . amLODipine  10 mg Per Tube Daily  . chlorhexidine  15 mL Mouth Rinse BID  . Chlorhexidine Gluconate Cloth  6 each Topical Daily  . docusate  100 mg Per Tube BID  . famotidine  20 mg Per Tube Daily  . feeding supplement (PRO-STAT SUGAR FREE 64)  60 mL Per Tube BID  . free water  400 mL Per Tube Q4H  . heparin injection (subcutaneous)  5,000 Units Subcutaneous Q8H  . hydrALAZINE  50 mg Per Tube Q8H  . insulin aspart  0-20 Units Subcutaneous Q4H  . insulin aspart  4 Units Subcutaneous Q4H  . insulin detemir  60 Units Subcutaneous BID  . ipratropium-albuterol  3 mL Nebulization TID  . mouth rinse  15 mL Mouth Rinse q12n4p  . metoprolol tartrate  50 mg Per Tube BID  . polyethylene glycol  17 g Per Tube Daily  . sodium chloride flush  3 mL Intravenous  Once      Madelon Lips MD 02/10/2020, 12:00 PM

## 2020-02-10 NOTE — Progress Notes (Signed)
Pt taken off bipap and placed on 50% VM.  Currently appears pt tol well, sat 97%, VSS.

## 2020-02-11 LAB — BASIC METABOLIC PANEL
Anion gap: 12 (ref 5–15)
BUN: 124 mg/dL — ABNORMAL HIGH (ref 8–23)
CO2: 26 mmol/L (ref 22–32)
Calcium: 9.3 mg/dL (ref 8.9–10.3)
Chloride: 101 mmol/L (ref 98–111)
Creatinine, Ser: 2.28 mg/dL — ABNORMAL HIGH (ref 0.61–1.24)
GFR calc Af Amer: 32 mL/min — ABNORMAL LOW (ref >60)
GFR calc non Af Amer: 28 mL/min — ABNORMAL LOW (ref >60)
Glucose, Bld: 218 mg/dL — ABNORMAL HIGH (ref 70–99)
Potassium: 3.6 mmol/L (ref 3.5–5.1)
Sodium: 139 mmol/L (ref 135–145)

## 2020-02-11 LAB — GLUCOSE, CAPILLARY
Glucose-Capillary: 205 mg/dL — ABNORMAL HIGH (ref 70–99)
Glucose-Capillary: 193 mg/dL — ABNORMAL HIGH (ref 70–99)
Glucose-Capillary: 216 mg/dL — ABNORMAL HIGH (ref 70–99)
Glucose-Capillary: 182 mg/dL — ABNORMAL HIGH (ref 70–99)
Glucose-Capillary: 184 mg/dL — ABNORMAL HIGH (ref 70–99)
Glucose-Capillary: 192 mg/dL — ABNORMAL HIGH (ref 70–99)

## 2020-02-11 MED ORDER — FUROSEMIDE 10 MG/ML IJ SOLN
80.0000 mg | Freq: Once | INTRAMUSCULAR | Status: AC
  Administered 2020-02-11: 80 mg via INTRAVENOUS
  Filled 2020-02-11: qty 8

## 2020-02-11 MED ORDER — GLUCERNA 1.2 CAL PO LIQD
1000.0000 mL | ORAL | Status: DC
  Administered 2020-02-11 – 2020-02-28 (×16): 1000 mL
  Filled 2020-02-11 (×26): qty 1000

## 2020-02-11 NOTE — Plan of Care (Signed)
  Problem: Nutrition: Goal: Adequate nutrition will be maintained Outcome: Progressing   

## 2020-02-11 NOTE — TOC Progression Note (Signed)
Transition of Care [facility]) - Progression Note    Patient Details  Name: [name] MRN: [id number] Date of Birth: Dec 27, 1945  Transition of Care [facility]) CM/SW [place], [place] Phone Number: 02/11/2020, 12:34 PM  Clinical Narrative:    Per most recent palliative note, GOC still being discussed with patient's family. TOC team will continue to follow to assist with any discharge planning needs.   Expected Discharge Plan: Skilled Nursing Facility Barriers to Discharge: Continued Medical Work up  Expected Discharge Plan and Services Expected Discharge Plan: [place]                                               Social Determinants of Health (SDOH) Interventions    Readmission Risk Interventions Readmission Risk Prevention Plan 05/08/2019  [place] or Home Care Consult Complete  Social Work Consult for [place] Planning/Counseling Complete  Medication Review [employer]) Complete  Some recent data might be hidden

## 2020-02-11 NOTE — Progress Notes (Signed)
NAME:  [name], MRN:  [id number], DOB:  [birth year], LOS: 62 ADMISSION DATE:  01/25/2020, CONSULTATION DATE:  01/31/2020 REFERRING MD:  Graciella Freer, CHIEF COMPLAINT:  Worsening AMS/ hypoxia  Brief History   32 yoM admitted 6/11 with small bilateral [name] requiring intermittent cleviprex for strict BP control, with hospitalization complicated by likely aspiration pneumonia, AKI on CKD, and worsening mental status  and desaturation episodes with increased O2 needs.  Mental status prevents BiPAP.  Intubated for hypercarbic respiratory failure  History of present illness   HPI obtained from medical chart review as patient is unresponsive.   74 year old male with prior extensive medical history significant for blindness, prior ICH with previous trach/ PEG with some baseline aphasia, HFpEF, AS s/p TAVR 04/2019, afib not on AC due to GI bleeding and ICH, CAD, OSA, CKD stage IV, HTN, morbid obesity, and poorly controlled DMT2 with neuropathy who presented 6/11 with 3 day history of bifrontal headache, recent falls 2-3 weeks prior, increasing confusion/ syncope and nausea/ vomiting.  Found on workup to have small bilateral intraventricular hemorrhage of unclear etiology, suspected to be traumatic given fall history vs spontaneous.   MRI unable to be obtained given pacemaker.  Admitted to IMTS with neurology consulting.  Required intermittent cleviprex gtt for strict BP control. Hospitalization complicated by suspected aspiration pneumonia on 6/13 and started empirically on antibiotics, and worsening AKI on CKD, and progressive decline in mental status.  Serial imaging mostly stable.  Palliative care consulted to help with goals of care and Overnight, 6/16, he had worsening mental status , increased O2 needs, and desaturations. ABG obtained showed 7.306/ 68/ 72/ 33.5.   PCCM consulted for further management of respiratory failure.   Past Medical History  Blindness, OSA, prior ICH with previous trach/ PEG, HFpEF,  afib, AS s/p TAVR 04/2019, CAD, CKD stage IV, HTN, DMT2 poorly controlled with neuropathy, morbid obesity, HLD, GERD, bradycardia/ LBBB with pacemaker,   Significant Hospital Events   6/11 admitted IMTS to ICU, intermittently requiring cleviprex 6/13 concern for aspiration, abx started 6/15 requiring cleviprex gtt 6/17 worsening mental status/ hypoxia- intubated 6/24 extubated to BiPAP 6/25 started on dexmet overnight for reported agitation. Remains on BiPAP. Discontinuing BiPAP during day shift   Consults:  Neurology 6/11 Palliative care 6/15 PCCM 6/17  Procedures:  6/14 right nare cortrak >> 6/17 ETT >> 6/17 bronchoscopy-> RML mucous plug   Significant Diagnostic Tests:  6/11 CTH >> There is hemorrhage in the dependent portion of each lateral ventricle of uncertain etiology. No brain parenchymal hemorrhage elsewhere noted. No mass. No extra-axial fluid.  There is mild atrophy with extensive periventricular small vessel disease. No acute appearing infarct is evident.  There are multiple foci of arterial vascular calcification. There is mucosal thickening in several ethmoid air cells.  6/11 RUQ limited US >> Gallbladder sludge and gallstones without complicating factors.  Right renal cysts stable from prior CT examination.  6/12 CTH >> unchanged [name] layering within the occipital horns of the lateral ventricles  6/13 CTH >> 1. Increased but still small volume intraventricular hemorrhage layering in the occipital horns of the lateral ventricles. 2. Borderline increase in ventriculomegaly, recommend follow-up.  6/13 TTE >> LVEF 60-65%, mod LVH, no wall motion abnormality, A2ZH, RV systolic function normal, mild LA dilation, post TAVR, mild dilation of aortic root  6/14 CTH >>  Stable hemorrhage layering in each occipital horn of the lateral ventricles, stable. No hemorrhage appreciable. There is atrophy with extensive supratentorial small vessel disease, stable.  No acute infarct  evident. There is extensive arterial vascular calcification at multiple sites. There is mucosal thickening in multiple ethmoid air cells.  6/16 CTH >> Stable intraventricular hemorrhage. Small amount of subarachnoid hemorrhage on the left also stable. No new hemorrhage or infarct Mild ventricular enlargement is stable  6/17 CTH >>  Stable exam with small amount of bilateral intraventricular hemorrhage and subarachnoid overlying L parietal lobe.  No new hemorrhage   6/17 Renal US: no hydronephrosis. Increased cortical echogenicity of bilateral kidneys. Bilateral renal cysts. L cyst arising form inferior pole and complicated of internal areas with increased echogenicity   6/23: CXR BL congestion   Micro Data:  6/11 SARS 2 >> neg 6/15 MRSA PCR >> neg 6/15 BCx2 >> ngtd >> 6/17 BAL  >> nml flora  Antimicrobials:  6/13 azithro >> 6/14 6/13 ceftriaxone >> 6/14 6/14 unasyn >> 6/16 6/17 vanc >>6/18 6/17 cefepime >> 6/17 flagyl >>6/18  Interim history/subjective:   On BiPAP this morning.  Objective   Blood pressure (!) 160/77, pulse 62, temperature 98 F (36.7 C), temperature source Axillary, resp. rate (!) 23, height _0  (1.753 m), weight (!) 137.5 kg, SpO2 96 %.    Vent Mode: BIPAP;PCV FiO2 (%):  [50 %] 50 % Set Rate:  [10 bmp] 10 bmp PEEP:  [7 cmH20] 7 cmH20   Intake/Output Summary (Last 24 hours) at 02/11/2020 1132 Last data filed at 02/11/2020 1000 Gross per 24 hour  Intake 3324 ml  Output 2425 ml  Net 899 ml   Filed Weights   02/06/20 0500 02/07/20 0500 02/10/20 0500  Weight: 135 kg 134.5 kg (!) 137.5 kg    Examination: General: obese, chronically ill, on BIPAP, alert to voice  HEENT: bipap in place, mepelix on nose Neuro: Opens eyes and mumbles.  Does not follow commands. CV: RRR, s1 s2 PULM: Diminshed BL, no wheeze  GI: obese, soft, nt nd  Extremities: left forefoot amputation  Skin: no rash   Resolved Hospital Problem list    Assessment & Plan:    Critically ill due to acute hypercarbic acute hypoxic respiratory failure now status post intubation. On BiPAP but this is not a long-term solution. -We will transition to nasal cannula which he appears to be tolerating. -Continue nocturnal BiPAP as likely has component of baseline obesity hypoventilation.  Acute enchephalopathy likely multifactorial in setting of hypoxia/hypercarpia,  uremia, infection, CNS injury  -Continue to observe. -Ongoing goals of care discussion with family.  [name] - small, bilateral, stable  -Supportive care at this time.   Likely aspiration PNA -has completed course of antibiotics.  HTN Acute on chronic diastolic HF P:  - PRNs for bp control   AKI on CKD stage IV -poor candidate for long-term dialysis -Diurese today for positive fluid balance.  Will likely help keep him off BiPAP.  Goals of Care: - agree with ongoing palliative care discussions    Best practice:  Diet: EN per RDN via cortrak (nepro, carb steady) Pain/Anxiety/Delirium protocol (if indicated): na VAP protocol (if indicated): na DVT prophylaxis: heparin SQ GI prophylaxis: PPI Glucose control: Type 2 diabetes with adequate control on current regimen. Code Status: Full. Family Communication: met with [name] at bedside today  Disposition: ICU for now   CRITICAL CARE Performed by: Kipp Brood   Total critical care time: 35 minutes  Critical care time was exclusive of separately billable procedures and treating other patients.  Critical care was necessary to treat or prevent imminent or life-threatening deterioration.  Critical  care was time spent personally by me on the following activities: development of treatment plan with patient and/or surrogate as well as nursing, discussions with consultants, evaluation of patient's response to treatment, examination of patient, obtaining history from patient or surrogate, ordering and performing treatments and interventions, ordering and  review of laboratory studies, ordering and review of radiographic studies, pulse oximetry, re-evaluation of patient's condition and participation in multidisciplinary rounds.  Kipp Brood, MD [facility] ICU Physician [place]  Pager: [phone] Mobile: [phone] After hours: [phone].   02/11/2020 11:32 AM

## 2020-02-11 NOTE — Progress Notes (Signed)
Palliative:  HPI:73 y.o.malewith past medical history of stroke requiring trach/PEG previously, HFpEF, atrial fibrillation, severe aortic stenosis s/p TAVR, CKD stage IV, hypertension, OHS/OSA uses CPAP, diabetes, s/p left foot amputationadmitted on 6/11/2021with ongoing headache and CT head bilateral intraventricular hemorrhage.Hospitalization complicated by ongoing poor mental status, aspiration pneumonia, hypertension, and concern for airway protection.Required intubation 01/31/20 and extubated 02/07/20.   I met today at [name] bedside and no family present. He is alert and turns head towards voice. He attempts to make verbal response but words are incomprehensible as previous assessments. He does make attempt to stick out his tongue and follow some commands although this continues to be inconsistent.   I called daughter, [name], and I continue attempts to arrange family meeting/conversation. I continue to make attempts. Explained to family that palliative team is not available in the evening and night times. I offered early morning or lunch break phone calls or note for work to assist. I have been struggling to obtain any clear answers or set a time to come to a decision with family. At this time I would work under the assumption that they want all measures pursued. I will await call back from family for a time we can have further discussion to clarify.  I fear that communication amongst family members are not good. I will focus any further conversations with [name] and [name] who are HCPOA.    Exam: Alert, follows some simple commands. Unable to produce understandable speech. HFNC 10L. Breathing continues to be labored at rest with accessory muscle use. Abd large but soft.   Plan: - Awaiting to hear back from family so I can continue attempts to obtain clear direction and plan of care.   15 min  Vinie Sill, NP Palliative Medicine Team Pager [phone] (Please see [email or web address]  for schedule) Team Phone [phone]    Greater than 50%  of this time was spent counseling and coordinating care related to the above assessment and plan

## 2020-02-11 NOTE — Progress Notes (Signed)
Patient currently on BIPAP and tolerating settings well at this time. RT will continue to monitor as needed.

## 2020-02-11 NOTE — Progress Notes (Signed)
Nutrition Follow-up  DOCUMENTATION CODES:   Obesity unspecified  INTERVENTION:   Tube feeding via cortrak: Glucerna 1.2 at 60 ml/h (1440 ml per day) Pro-stat 60 ml BID  Provides 2128 kcal, 146 gm protein, 1167 ml free water daily  400 ml free water every 4 hours Total free water: 3567 ml   NUTRITION DIAGNOSIS:   Inadequate oral intake related to inability to eat as evidenced by NPO status.  Ongoing.   GOAL:   Patient will meet greater than or equal to 90% of their needs  Meeting with TF.   MONITOR:   Weight trends, Labs, I & O's, TF tolerance  REASON FOR ASSESSMENT:   Consult Enteral/tube feeding initiation and management  ASSESSMENT:   Pt with a PMH significant for Afib, cardiomyopathy, PPM, coronary disease, HTN, legally blind, T2DM, HTN, and HLD presented with N/V, headache, recent fall, recent syncope and AMS. Pt admitted with ICH.  Pt discussed during ICU rounds and with RN. Plan to transition to nasal cannula. Pt on Pisinemo today during visit.  TF changed to Nepro over weekend however not needed as this formula is only restricted in electrolytes and fluid and is higher in kcal/protein per liter. Discussed with CCM change to Glucerna.    6/24 extubated to bipap  6/14 cortrak, tip gastric  Medications reviewed and include: colace, 4 units novolog every 4 hours 60 units levemir BID, miralax  Labs reviewed: BUN/Cr: 119/2.15  CBG's: [phone]  UOP: 2200 ml   Diet Order:   Diet Order            Diet NPO time specified Except for: Sips with Meds  Diet effective now                 EDUCATION NEEDS:   Not appropriate for education at this time  Skin:  Skin Assessment: Skin Integrity Issues: Skin Integrity Issues::  (pressure injury: nose) Other: amputation all L toes  Last BM:  6/22 - large  Height:   Ht Readings from Last 1 Encounters:  01/31/20 5\' 9"  (1.753 m)    Weight:   Wt Readings from Last 1 Encounters:  02/10/20 (!) 137.5 kg     Ideal Body Weight:  72.7 kg  BMI:  Body mass index is 44.76 kg/m.  Estimated Nutritional Needs:   Kcal:  1900-2100  Protein:  140-160 grams  Fluid:  2 L/day  Lockie Pares., RD, LDN, CNSC See AMiON for contact information

## 2020-02-11 NOTE — Progress Notes (Signed)
Physical Therapy Treatment Patient Details Name: [name] MRN: [id number] DOB: March 24, 1946 Today's Date: 02/11/2020    History of Present Illness Pt is a 74 y.o. male with PMH of HTN, PAF, SSS, CHF, tachycardia, Heart failure, cerebral artery occlusion with cerebral infarction, OSA, chronic obstructive lung disease, pulmonary edema, DM, type II, CVA, DJD, stage 3 chronic kidney disease, AKI, Gout, depression, blindness, coarse tremors, CAD, L mid foot amputation, amd pacemaker. Pt sent to ED from outpatient imaging when pt had syncope episode, urinated on self, and became confused. CT reveals stable hemorrhage layering in each occipital horn of the lateral ventricles, stable. Pt intubated on 6/17 and extubated on 6/24.    PT Comments    Pt with decreased arousal, lethargic. Pt only responding to name. Some purposeful movement with L UE but otherwise no initiation of movement or assist with transfers. Pt dependent for sitting up using egress in bed. Minimal eye opening (aware he is blind) however no interaction with PT. Cont to recommend SNF upon d/c as pt unsafe to return home alone.    Follow Up Recommendations  SNF;Supervision/Assistance - 24 hour     Equipment Recommendations  Hospital bed;Wheelchair (measurements PT);Wheelchair cushion (measurements PT)    Recommendations for Other Services       Precautions / Restrictions Precautions Precautions: Fall Precaution Comments: pt is Blind Restrictions Weight Bearing Restrictions: No    Mobility  Bed Mobility Overal bed mobility: Needs Assistance Bed Mobility: Rolling Rolling: Total assist;+2 for physical assistance         General bed mobility comments: used bed egress to sit pt up, total assist to bring trunk away from bed, dependent to maintain upright position, no protective response with decreased trunk support  Transfers                 General transfer comment: deferred due to  lethargy  Ambulation/Gait                 Stairs             Wheelchair Mobility    Modified Rankin (Stroke Patients Only) Modified Rankin (Stroke Patients Only) Pre-Morbid Rankin Score: Moderately severe disability Modified Rankin: Severe disability     Balance Overall balance assessment: Needs assistance Sitting-balance support: Feet supported;Bilateral upper extremity supported Sitting balance-Leahy Scale: Zero Sitting balance - Comments: used egress for dependent support Postural control: Posterior lean;Left lateral lean                                  Cognition Arousal/Alertness: Lethargic Behavior During Therapy: Flat affect                                   General Comments: pt states "yes" to name but doesn't maintain eyes open, minimal attn, doesn't tend to task, doesn't answer questions, no protective response      Exercises Other Exercises Other Exercises: passive ROM to bilat UEs and LEs    General Comments General comments (skin integrity, edema, etc.): Pt on High flow O2, VSS      Pertinent Vitals/Pain Pain Assessment: Faces Faces Pain Scale: No hurt    Home Living                      Prior Function  PT Goals (current goals can now be found in the care plan section) Progress towards PT goals: Not progressing toward goals - comment    Frequency    Min 3X/week      PT Plan Current plan remains appropriate    Co-evaluation              AM-PAC PT "6 Clicks" Mobility   Outcome Measure  Help needed turning from your back to your side while in a flat bed without using bedrails?: Total Help needed moving from lying on your back to sitting on the side of a flat bed without using bedrails?: Total Help needed moving to and from a bed to a chair (including a wheelchair)?: Total Help needed standing up from a chair using your arms (e.g., wheelchair or bedside chair)?:  Total Help needed to walk in hospital room?: Total Help needed climbing 3-5 steps with a railing? : Total 6 Click Score: 6    End of Session Equipment Utilized During Treatment: Oxygen Activity Tolerance: Patient limited by lethargy Patient left: in bed;with call bell/phone within reach;with bed alarm set;with nursing/sitter in room Nurse Communication: Mobility status;Need for lift equipment PT Visit Diagnosis: Other symptoms and signs involving the nervous system (Z20.802)     Time: 2336-1224 PT Time Calculation (min) (ACUTE ONLY): 20 min  Charges:  $Therapeutic Activity: 8-22 mins                     Kittie Plater, PT, DPT Acute Rehabilitation Services Pager #: [phone] Office #: [phone]    [name] 02/11/2020, 12:43 PM

## 2020-02-11 NOTE — Plan of Care (Signed)
°  Problem: Clinical Measurements: Goal: Will remain free from infection Outcome: Progressing Goal: Diagnostic test results will improve Outcome: Progressing Goal: Respiratory complications will improve Outcome: Progressing Goal: Cardiovascular complication will be avoided Outcome: Progressing   Problem: Activity: Goal: Risk for activity intolerance will decrease Outcome: Progressing   Problem: Nutrition: Goal: Adequate nutrition will be maintained Outcome: Progressing   Problem: Elimination: Goal: Will not experience complications related to urinary retention Outcome: Progressing   Problem: Pain Managment: Goal: General experience of comfort will improve Outcome: Progressing   Problem: Elimination: Goal: Will not experience complications related to bowel motility Outcome: Not Progressing

## 2020-02-12 LAB — BASIC METABOLIC PANEL
Anion gap: 11 (ref 5–15)
BUN: 125 mg/dL — ABNORMAL HIGH (ref 8–23)
CO2: 28 mmol/L (ref 22–32)
Calcium: 9.3 mg/dL (ref 8.9–10.3)
Chloride: 99 mmol/L (ref 98–111)
Creatinine, Ser: 2.19 mg/dL — ABNORMAL HIGH (ref 0.61–1.24)
GFR calc Af Amer: 34 mL/min — ABNORMAL LOW (ref >60)
GFR calc non Af Amer: 29 mL/min — ABNORMAL LOW (ref >60)
Glucose, Bld: 170 mg/dL — ABNORMAL HIGH (ref 70–99)
Potassium: 3.9 mmol/L (ref 3.5–5.1)
Sodium: 138 mmol/L (ref 135–145)

## 2020-02-12 LAB — GLUCOSE, CAPILLARY
Glucose-Capillary: 192 mg/dL — ABNORMAL HIGH (ref 70–99)
Glucose-Capillary: 155 mg/dL — ABNORMAL HIGH (ref 70–99)
Glucose-Capillary: 181 mg/dL — ABNORMAL HIGH (ref 70–99)
Glucose-Capillary: 179 mg/dL — ABNORMAL HIGH (ref 70–99)
Glucose-Capillary: 171 mg/dL — ABNORMAL HIGH (ref 70–99)
Glucose-Capillary: 175 mg/dL — ABNORMAL HIGH (ref 70–99)

## 2020-02-12 NOTE — Progress Notes (Signed)
NAME:  [name], MRN:  [id number], DOB:  Jun 15, 1946, LOS: 89 ADMISSION DATE:  01/25/2020, CONSULTATION DATE:  01/31/2020 REFERRING MD:  Graciella Freer, CHIEF COMPLAINT:  Worsening AMS/ hypoxia  Brief History   34 yoM admitted 6/11 with small bilateral [name] requiring intermittent cleviprex for strict BP control, with hospitalization complicated by likely aspiration pneumonia, AKI on CKD, and worsening mental status  and desaturation episodes with increased O2 needs.  Mental status prevents BiPAP.  Intubated for hypercarbic respiratory failure  History of present illness   HPI obtained from medical chart review as patient is unresponsive.   74 year old male with prior extensive medical history significant for blindness, prior ICH with previous trach/ PEG with some baseline aphasia, HFpEF, AS s/p TAVR 04/2019, afib not on AC due to GI bleeding and ICH, CAD, OSA, CKD stage IV, HTN, morbid obesity, and poorly controlled DMT2 with neuropathy who presented 6/11 with 3 day history of bifrontal headache, recent falls 2-3 weeks prior, increasing confusion/ syncope and nausea/ vomiting.  Found on workup to have small bilateral intraventricular hemorrhage of unclear etiology, suspected to be traumatic given fall history vs spontaneous.   MRI unable to be obtained given pacemaker.  Admitted to IMTS with neurology consulting.  Required intermittent cleviprex gtt for strict BP control. Hospitalization complicated by suspected aspiration pneumonia on 6/13 and started empirically on antibiotics, and worsening AKI on CKD, and progressive decline in mental status.  Serial imaging mostly stable.  Palliative care consulted to help with goals of care and Overnight, 6/16, he had worsening mental status , increased O2 needs, and desaturations. ABG obtained showed 7.306/ 68/ 72/ 33.5.   PCCM consulted for further management of respiratory failure.   Past Medical History  Blindness, OSA, prior ICH with previous trach/ PEG, HFpEF,  afib, AS s/p TAVR 04/2019, CAD, CKD stage IV, HTN, DMT2 poorly controlled with neuropathy, morbid obesity, HLD, GERD, bradycardia/ LBBB with pacemaker,   Significant Hospital Events   6/11 admitted IMTS to ICU, intermittently requiring cleviprex 6/13 concern for aspiration, abx started 6/15 requiring cleviprex gtt 6/17 worsening mental status/ hypoxia- intubated 6/24 extubated to BiPAP 6/25 started on dexmet overnight for reported agitation. Remains on BiPAP. Discontinuing BiPAP during day shift   Consults:  Neurology 6/11 Palliative care 6/15 PCCM 6/17  Procedures:  6/14 right nare cortrak >> 6/17 ETT >> 6/17 bronchoscopy-> RML mucous plug   Significant Diagnostic Tests:  6/11 CTH >> There is hemorrhage in the dependent portion of each lateral ventricle of uncertain etiology. No brain parenchymal hemorrhage elsewhere noted. No mass. No extra-axial fluid.  There is mild atrophy with extensive periventricular small vessel disease. No acute appearing infarct is evident.  There are multiple foci of arterial vascular calcification. There is mucosal thickening in several ethmoid air cells.  6/11 RUQ limited US >> Gallbladder sludge and gallstones without complicating factors.  Right renal cysts stable from prior CT examination.  6/12 CTH >> unchanged [name] layering within the occipital horns of the lateral ventricles  6/13 CTH >> 1. Increased but still small volume intraventricular hemorrhage layering in the occipital horns of the lateral ventricles. 2. Borderline increase in ventriculomegaly, recommend follow-up.  6/13 TTE >> LVEF 60-65%, mod LVH, no wall motion abnormality, L3TD, RV systolic function normal, mild LA dilation, post TAVR, mild dilation of aortic root  6/14 CTH >>  Stable hemorrhage layering in each occipital horn of the lateral ventricles, stable. No hemorrhage appreciable. There is atrophy with extensive supratentorial small vessel disease, stable.  No acute infarct  evident. There is extensive arterial vascular calcification at multiple sites. There is mucosal thickening in multiple ethmoid air cells.  6/16 CTH >> Stable intraventricular hemorrhage. Small amount of subarachnoid hemorrhage on the left also stable. No new hemorrhage or infarct Mild ventricular enlargement is stable  6/17 CTH >>  Stable exam with small amount of bilateral intraventricular hemorrhage and subarachnoid overlying L parietal lobe.  No new hemorrhage   6/17 Renal US: no hydronephrosis. Increased cortical echogenicity of bilateral kidneys. Bilateral renal cysts. L cyst arising form inferior pole and complicated of internal areas with increased echogenicity   6/23: CXR BL congestion   Micro Data:  6/11 SARS 2 >> neg 6/15 MRSA PCR >> neg 6/15 BCx2 >> ngtd >> 6/17 BAL  >> nml flora  Antimicrobials:  6/13 azithro >> 6/14 6/13 ceftriaxone >> 6/14 6/14 unasyn >> 6/16 6/17 vanc >>6/18 6/17 cefepime >> 6/17 flagyl >>6/18  Interim history/subjective:   Transition back to nasal cannula this morning.  Currently on 5 L.  Objective   Blood pressure (!) 156/90, pulse 61, temperature 98.1 F (36.7 C), temperature source Axillary, resp. rate (!) 28, height _0  (1.753 m), weight (!) 137.5 kg, SpO2 96 %.    Vent Mode: BIPAP;PCV FiO2 (%):  [50 %] 50 % Set Rate:  [10 bmp] 10 bmp PEEP:  [7 cmH20] 7 cmH20   Intake/Output Summary (Last 24 hours) at 02/12/2020 1255 Last data filed at 02/12/2020 1200 Gross per 24 hour  Intake 1642 ml  Output 2000 ml  Net -358 ml   Filed Weights   02/06/20 0500 02/07/20 0500 02/10/20 0500  Weight: 135 kg 134.5 kg (!) 137.5 kg    Examination: General: obese, chronically ill, on nasal cannula., alert to voice  HEENT: Nasal cannula, mepelix on nose Neuro: Opens eyes and mumbles.  Does not follow commands. CV: RRR, s1 s2 PULM: Diminshed BL, no wheeze  GI: obese, soft, nt nd  Extremities: left forefoot amputation  Skin: no rash   Resolved  Hospital Problem list    Assessment & Plan:   Critically ill due to acute hypercarbic acute hypoxic respiratory failure now status post intubation. Appears to be tolerating BiPAP at nighttime and nasal cannula during the day. -We will transition to nasal cannula which he appears to be tolerating. -Continue nocturnal BiPAP as likely has component of baseline obesity hypoventilation.  Acute enchephalopathy likely multifactorial in setting of hypoxia/hypercarpia,  uremia, infection, CNS injury  -Continue to observe. -Ongoing goals of care discussion with family.  [name] - small, bilateral, stable  -Supportive care at this time.   Likely aspiration PNA -has completed course of antibiotics.  HTN Acute on chronic diastolic HF P:  - PRNs for bp control   AKI on CKD stage IV -poor candidate for long-term dialysis -Diurese today for positive fluid balance.  Will likely help keep him off BiPAP.  Goals of Care: - agree with ongoing palliative care discussions    Best practice:  Diet: EN per RDN via cortrak (nepro, carb steady) Pain/Anxiety/Delirium protocol (if indicated): na VAP protocol (if indicated): na DVT prophylaxis: heparin SQ GI prophylaxis: PPI Glucose control: Type 2 diabetes with adequate control on current regimen. Code Status: Full. Family Communication: Attempted to call daughter today with no answer.  Ongoing palliative care discussions.  Overall long-term prognosis is not good. Disposition: ICU for now   CRITICAL CARE Performed by: Kipp Brood   Total critical care time: 35 minutes  Critical care time  was exclusive of separately billable procedures and treating other patients.  Critical care was necessary to treat or prevent imminent or life-threatening deterioration.  Critical care was time spent personally by me on the following activities: development of treatment plan with patient and/or surrogate as well as nursing, discussions with consultants, evaluation  of patient's response to treatment, examination of patient, obtaining history from patient or surrogate, ordering and performing treatments and interventions, ordering and review of laboratory studies, ordering and review of radiographic studies, pulse oximetry, re-evaluation of patient's condition and participation in multidisciplinary rounds.  Kipp Brood, MD [facility] ICU Physician [place]  Pager: [phone] Mobile: [phone] After hours: [phone].   02/12/2020 12:55 PM

## 2020-02-12 NOTE — Progress Notes (Signed)
Occupational Therapy Treatment Patient Details Name: [name] MRN: [id number] DOB: 1945/10/31 Today's Date: 02/12/2020    History of present illness Pt is a 74 y.o. male with PMH of HTN, PAF, SSS, CHF, tachycardia, Heart failure, cerebral artery occlusion with cerebral infarction, OSA, chronic obstructive lung disease, pulmonary edema, DM, type II, CVA, DJD, stage 3 chronic kidney disease, AKI, Gout, depression, blindness, coarse tremors, CAD, L mid foot amputation, amd pacemaker. Pt sent to ED from outpatient imaging when pt had syncope episode, urinated on self, and became confused. CT reveals stable hemorrhage layering in each occipital horn of the lateral ventricles, stable. Pt intubated on 6/17 and extubated on 6/24.   OT comments  Pt lethargic with limited ability to participate.  He is unable to engage in ADLs tasks, and requires total a for all aspects of ADLs and functional mobility.  He does not follow any commands.  He is not progressing toward OT goals.  If he doesn't demonstrate ability to progress next session, will consider decreasing frequency   Follow Up Recommendations  SNF;Supervision/Assistance - 24 hour    Equipment Recommendations  None recommended by OT    Recommendations for Other Services      Precautions / Restrictions Precautions Precautions: Fall Precaution Comments: pt is Blind       Mobility Bed Mobility               General bed mobility comments: Pt requires total A to shift shoulders to the left in the bed   Transfers                      Balance                                           ADL either performed or assessed with clinical judgement   ADL                                         General ADL Comments: Pt unable to engage in ADL tasks even with hand over hand assist      Vision       Perception     Praxis      Cognition Arousal/Alertness: Lethargic Behavior During  Therapy: Flat affect Overall Cognitive Status: Impaired/Different from baseline Area of Impairment: Attention                   Current Attention Level: Focused           General Comments: Pt lethargic.  he will open eyes to stimulation, but doesn't sustain attention.  He did mumble something twice, but unable to discern what he was saying.  He followed no commands and did not attempt to engage with his environment         Exercises Other Exercises Other Exercises: Pt moved into chair position with no change in arousal.  Attempted to work on sitting balance, but pt unable to engage    Shoulder Instructions       General Comments      Pertinent Vitals/ Pain       Pain Assessment: Faces Faces Pain Scale: No hurt  Home Living  Prior Functioning/Environment              Frequency  Min 2X/week        Progress Toward Goals  OT Goals(current goals can now be found in the care plan section)  Progress towards OT goals: Not progressing toward goals - comment (If no progress next session, consider decreasing frequency )     Plan Discharge plan remains appropriate    Co-evaluation                 AM-PAC OT "6 Clicks" Daily Activity     Outcome Measure   Help from another person eating meals?: Total Help from another person taking care of personal grooming?: Total Help from another person toileting, which includes using toliet, bedpan, or urinal?: Total Help from another person bathing (including washing, rinsing, drying)?: Total Help from another person to put on and taking off regular upper body clothing?: Total Help from another person to put on and taking off regular lower body clothing?: Total 6 Click Score: 6    End of Session Equipment Utilized During Treatment: Oxygen  OT Visit Diagnosis: Other symptoms and signs involving cognitive function;Other symptoms and signs involving the  nervous system (R29.898);Low vision, both eyes (H54.2);Apraxia (R48.2);Muscle weakness (generalized) (M62.81)   Activity Tolerance Patient limited by lethargy   Patient Left in bed;with call bell/phone within reach   Nurse Communication Mobility status        Time: 1205-1216 OT Time Calculation (min): 11 min  Charges: OT General Charges $OT Visit: 1 Visit OT Treatments $Therapeutic Activity: 8-22 mins  Nilsa Nutting., OTR/L Acute Rehabilitation Services Pager [phone] Office [phone]    Lucille Passy M 02/12/2020, 4:10 PM

## 2020-02-12 NOTE — Progress Notes (Signed)
RT placed patient on BIPAP for the night per order. Patient is tolerating BIPAP well at this time. No respiratory distress noted. RT will monitor as needed.

## 2020-02-12 NOTE — Progress Notes (Signed)
Palliative:  HPI:73 y.o.malewith past medical history of stroke requiring trach/PEG previously, HFpEF, atrial fibrillation, severe aortic stenosis s/p TAVR, CKD stage IV, hypertension, OHS/OSA uses CPAP, diabetes, s/p left foot amputationadmitted on 6/11/2021with ongoing headache and CT head bilateral intraventricular hemorrhage.Hospitalization complicated by ongoing poor mental status, aspiration pneumonia, hypertension, and concern for airway protection.Required intubation 6/17/21and extubated 02/07/20.    I met today at [name] bedside and no family present. He awakens to voice and does attempt to verbalize but unable to do anything but make noises and grunts. He continues to struggle with secretion management but does have weak cough but struggling to clear secretions.   I called today and spoke with daughter/HCPOA, [name]. We discussed the plan for family meeting planned for Saturday and she shares that the children are unable to meet until 6:30 in the evening. I explained that this is not an option for palliative team. There was some confusion as I confirmed that this meeting would not need to be in person and could be via phone even on lunch break and [name] explains that family were under the impression that this needed to be in person.   We further discussed the goal of family meeting is to get clarity on how to move forward with anticipation of decisions that will need to be made. I explained that [name] continues to be tenuous with respiratory and renal status as well as no improvement in mental status. We discussed intubation, dialysis, and long term feeding tube and the risks vs benefits of each. Family would be open to tracheostomy but do not want him to have ETT as this causes him discomfort. I explained that the majority of the time patients are intubated urgently and tracheostomy is a procedure that would need to be planned so would likely require ETT at least temporarily.  Indicated that we would not do tracheostomy now as we are trying to give him every opportunity to avoid ventilator support. [name] also asks about doing dialysis to see if this helps him to feel better and I explained that this is not how dialysis works and he would need to meet requirements to be a candidate for dialysis and this also comes with risks and complications in itself.   [name] confirms that at this time family would like all interventions pursued to prolong life until they come to a consensus. She is trying to have the family have conversations together in the evenings. [name] herself understands that his prognosis is poor and she also asks about alternative of comfort care. We discussed comfort care as no further labs with less focus on numbers and focus on medications that will allow him comfort. This plan would not include feeding tube or the interventions discussed today. Prognosis would likely be days to 1-2 weeks and if comfort care decision is made he could die in hospital or potentially transition to a hospice facility.   All questions/concerns addressed. Emotional support provided.   Exam: Awakens to voice. Does not follow commands today but makes failed attempts at verbal response. Breathing still labored at times at rest. Morbid obesity. Abd large but soft. Generalized weakness.   Plan: - Plan for continued aggressive care. No limitations set at this time.  - Some family considering alternatives and comfort care but they are working to get on same page.  - I have again offered phone conference with children to assist them to get on the same page. They continue with conversations amongst themselves.  -  [name] to send me copy of Advance Directive when found as well.   [place], NP Palliative Medicine Team Pager [phone] (Please see [email or web address] for schedule) Team Phone [phone]    Greater than 50%  of this time was spent counseling and coordinating care  related to the above assessment and plan

## 2020-02-13 LAB — GLUCOSE, CAPILLARY
Glucose-Capillary: 191 mg/dL — ABNORMAL HIGH (ref 70–99)
Glucose-Capillary: 179 mg/dL — ABNORMAL HIGH (ref 70–99)
Glucose-Capillary: 226 mg/dL — ABNORMAL HIGH (ref 70–99)
Glucose-Capillary: 164 mg/dL — ABNORMAL HIGH (ref 70–99)
Glucose-Capillary: 164 mg/dL — ABNORMAL HIGH (ref 70–99)
Glucose-Capillary: 195 mg/dL — ABNORMAL HIGH (ref 70–99)

## 2020-02-13 LAB — BASIC METABOLIC PANEL
Anion gap: 11 (ref 5–15)
BUN: 125 mg/dL — ABNORMAL HIGH (ref 8–23)
CO2: 27 mmol/L (ref 22–32)
Calcium: 9.2 mg/dL (ref 8.9–10.3)
Chloride: 99 mmol/L (ref 98–111)
Creatinine, Ser: 2.17 mg/dL — ABNORMAL HIGH (ref 0.61–1.24)
GFR calc Af Amer: 34 mL/min — ABNORMAL LOW (ref >60)
GFR calc non Af Amer: 30 mL/min — ABNORMAL LOW (ref >60)
Glucose, Bld: 185 mg/dL — ABNORMAL HIGH (ref 70–99)
Potassium: 4.3 mmol/L (ref 3.5–5.1)
Sodium: 137 mmol/L (ref 135–145)

## 2020-02-13 MED ORDER — FREE WATER
200.0000 mL | Status: DC
  Administered 2020-02-13 – 2020-02-17 (×26): 200 mL

## 2020-02-13 MED ORDER — TORSEMIDE 20 MG PO TABS
20.0000 mg | ORAL_TABLET | Freq: Every day | ORAL | Status: DC
  Administered 2020-02-13 – 2020-02-17 (×5): 20 mg via ORAL
  Filled 2020-02-13 (×5): qty 1

## 2020-02-13 MED ORDER — CARVEDILOL 12.5 MG PO TABS
12.5000 mg | ORAL_TABLET | Freq: Two times a day (BID) | ORAL | Status: DC
  Administered 2020-02-13 – 2020-02-17 (×10): 12.5 mg
  Filled 2020-02-13 (×10): qty 1

## 2020-02-13 NOTE — Progress Notes (Signed)
Paged about transferring out of ICU. IMTS will take over 02/14/20 at [facility].

## 2020-02-13 NOTE — Progress Notes (Signed)
Palliative:  HPI:73 y.o.malewith past medical history of stroke requiring trach/PEG previously, HFpEF, atrial fibrillation, severe aortic stenosis s/p TAVR, CKD stage IV, hypertension, OHS/OSA uses CPAP, diabetes, s/p left foot amputationadmitted on 6/11/2021with ongoing headache and CT head bilateral intraventricular hemorrhage.Hospitalization complicated by ongoing poor mental status, aspiration pneumonia, hypertension, and concern for airway protection.Required intubation 6/17/21and extubated 02/07/20.   Mr. Maclellan is much unchanged today. Not  Any improvement but he is not worse. Maintaining on nasal cannula daytime with BiPAP support at night. He will not speak or follow any commands this morning. Updated RN and Dr. Lynetta Mare on conversations with family so far.   I called and touched base with [name]. No changes in goals of care but she has shared my contact information with her siblings if they have questions/concerns. I have attempted on MULTIPLE occasions to arrange family meeting/conference call without success. I was clear about the decisions that need to be made during yesterdays conversation and plans for full aggressive care until family is on the same page for any alternative care with comfort or limitations. I have recommended consideration of comfort care given poor quality of life.   All questions/concerns addressed. Emotional support provided.    Exam: Awakens to voice but more lethargic today overall. Does not follow commands today and failed attempts at verbal response the majority of the time. He is able to say "yes/ok/huh" ~10% of the time. Breathing still labored at times at rest. Morbid obesity. Abd large but soft. Generalized weakness.   Plan: - Continue full aggressive care. - Will continue to touch base with family to see if there are any limitations or plans for comfort.   [place], NP Palliative Medicine Team Pager [phone] (Please see  [email or web address] for schedule) Team Phone [phone]    Greater than 50%  of this time was spent counseling and coordinating care related to the above assessment and plan

## 2020-02-13 NOTE — Progress Notes (Signed)
OT Cancellation Note  Patient Details Name: [name] MRN: [id number] DOB: [birth year]   Cancelled Treatment:    Reason Eval/Treat Not Completed: Patient not medically ready (placed on BIPAP)  Jeri Modena 02/13/2020, 12:17 PM

## 2020-02-13 NOTE — Progress Notes (Signed)
PT Cancellation Note  Patient Details Name: [name] MRN: [id number] DOB: 10-11-1945   Cancelled Treatment:    Reason Eval/Treat Not Completed: Medical issues which prohibited therapy. Pt back on BiPAP at this time. PT will hold until respiratory status is more stable.   Zenaida Niece 02/13/2020, 12:18 PM

## 2020-02-13 NOTE — Progress Notes (Signed)
NAME:  [name], MRN:  [id number], DOB:  May 25, 1946, LOS: 52 ADMISSION DATE:  01/25/2020, CONSULTATION DATE:  01/31/2020 REFERRING MD:  Graciella Freer, CHIEF COMPLAINT:  Worsening AMS/ hypoxia  Brief History   23 yoM admitted 6/11 with small bilateral [name] requiring intermittent cleviprex for strict BP control, with hospitalization complicated by likely aspiration pneumonia, AKI on CKD, and worsening mental status  and desaturation episodes with increased O2 needs.  Mental status prevents BiPAP.  Intubated for hypercarbic respiratory failure  History of present illness   HPI obtained from medical chart review as patient is unresponsive.   74 year old male with prior extensive medical history significant for blindness, prior ICH with previous trach/ PEG with some baseline aphasia, HFpEF, AS s/p TAVR 04/2019, afib not on AC due to GI bleeding and ICH, CAD, OSA, CKD stage IV, HTN, morbid obesity, and poorly controlled DMT2 with neuropathy who presented 6/11 with 3 day history of bifrontal headache, recent falls 2-3 weeks prior, increasing confusion/ syncope and nausea/ vomiting.  Found on workup to have small bilateral intraventricular hemorrhage of unclear etiology, suspected to be traumatic given fall history vs spontaneous.   MRI unable to be obtained given pacemaker.  Admitted to IMTS with neurology consulting.  Required intermittent cleviprex gtt for strict BP control. Hospitalization complicated by suspected aspiration pneumonia on 6/13 and started empirically on antibiotics, and worsening AKI on CKD, and progressive decline in mental status.  Serial imaging mostly stable.  Palliative care consulted to help with goals of care and Overnight, 6/16, he had worsening mental status , increased O2 needs, and desaturations. ABG obtained showed 7.306/ 68/ 72/ 33.5.   PCCM consulted for further management of respiratory failure.   Past Medical History  Blindness, OSA, prior ICH with previous trach/ PEG, HFpEF,  afib, AS s/p TAVR 04/2019, CAD, CKD stage IV, HTN, DMT2 poorly controlled with neuropathy, morbid obesity, HLD, GERD, bradycardia/ LBBB with pacemaker,   Significant Hospital Events   6/11 admitted IMTS to ICU, intermittently requiring cleviprex 6/13 concern for aspiration, abx started 6/15 requiring cleviprex gtt 6/17 worsening mental status/ hypoxia- intubated 6/24 extubated to BiPAP 6/25 started on dexmet overnight for reported agitation. Remains on BiPAP. Discontinuing BiPAP during day shift   Consults:  Neurology 6/11 Palliative care 6/15 PCCM 6/17  Procedures:  6/14 right nare cortrak >> 6/17 ETT >> 6/17 bronchoscopy-> RML mucous plug   Significant Diagnostic Tests:  6/11 CTH >> There is hemorrhage in the dependent portion of each lateral ventricle of uncertain etiology. No brain parenchymal hemorrhage elsewhere noted. No mass. No extra-axial fluid.  There is mild atrophy with extensive periventricular small vessel disease. No acute appearing infarct is evident.  There are multiple foci of arterial vascular calcification. There is mucosal thickening in several ethmoid air cells.  6/11 RUQ limited US >> Gallbladder sludge and gallstones without complicating factors.  Right renal cysts stable from prior CT examination.  6/12 CTH >> unchanged [name] layering within the occipital horns of the lateral ventricles  6/13 CTH >> 1. Increased but still small volume intraventricular hemorrhage layering in the occipital horns of the lateral ventricles. 2. Borderline increase in ventriculomegaly, recommend follow-up.  6/13 TTE >> LVEF 60-65%, mod LVH, no wall motion abnormality, S9HT, RV systolic function normal, mild LA dilation, post TAVR, mild dilation of aortic root  6/14 CTH >>  Stable hemorrhage layering in each occipital horn of the lateral ventricles, stable. No hemorrhage appreciable. There is atrophy with extensive supratentorial small vessel disease, stable.  No acute infarct  evident. There is extensive arterial vascular calcification at multiple sites. There is mucosal thickening in multiple ethmoid air cells.  6/16 CTH >> Stable intraventricular hemorrhage. Small amount of subarachnoid hemorrhage on the left also stable. No new hemorrhage or infarct Mild ventricular enlargement is stable  6/17 CTH >>  Stable exam with small amount of bilateral intraventricular hemorrhage and subarachnoid overlying L parietal lobe.  No new hemorrhage   6/17 Renal US: no hydronephrosis. Increased cortical echogenicity of bilateral kidneys. Bilateral renal cysts. L cyst arising form inferior pole and complicated of internal areas with increased echogenicity   6/23: CXR BL congestion   Micro Data:  6/11 SARS 2 >> neg 6/15 MRSA PCR >> neg 6/15 BCx2 >> ngtd >> 6/17 BAL  >> nml flora  Antimicrobials:  6/13 azithro >> 6/14 6/13 ceftriaxone >> 6/14 6/14 unasyn >> 6/16 6/17 vanc >>6/18 6/17 cefepime >> 6/17 flagyl >>6/18  Interim history/subjective:   Transition back to nasal cannula this morning.  Currently on 5 L.  Objective   Blood pressure (!) 171/80, pulse 66, temperature 99.3 F (37.4 C), temperature source Axillary, resp. rate (!) 27, height _0  (1.753 m), weight (!) 137.5 kg, SpO2 96 %.    Vent Mode: BIPAP;PCV FiO2 (%):  [0 %-50 %] 50 % Set Rate:  [10 bmp] 10 bmp PEEP:  [7 cmH20] 7 cmH20   Intake/Output Summary (Last 24 hours) at 02/13/2020 0903 Last data filed at 02/13/2020 0600 Gross per 24 hour  Intake 2120 ml  Output 1640 ml  Net 480 ml   Filed Weights   02/06/20 0500 02/07/20 0500 02/10/20 0500  Weight: 135 kg 134.5 kg (!) 137.5 kg    Examination: General: obese, chronically ill, on nasal cannula., alert to voice  HEENT: Nasal cannula, mepelix on nose Neuro: Opens eyes and mumbles.  Does not follow commands. CV: RRR, s1 s2 PULM: Diminshed BL, no wheeze  GI: obese, soft, nt nd  Extremities: left forefoot amputation  Skin: no rash    Resolved Hospital Problem list    Assessment & Plan:    Was Critically ill due to acute hypercarbic acute hypoxic respiratory failure now status post intubation. Appears to be tolerating BiPAP at nighttime and nasal cannula during the day with decreasing oxygen requirements. -Continue daytime [place], wean O2 as tolerated.  -Continue nocturnal BiPAP as likely has component of baseline obesity hypoventilation.  Acute enchephalopathy likely multifactorial in setting of hypoxia/hypercarpia,  uremia, infection, CNS injury  -Continue to observe.  May improve some as intraventricular blood clears.  -Ongoing goals of care discussion with family.  [name] - small, bilateral, stable  -Supportive care at this time.   Likely aspiration PNA -has completed course of antibiotics.  HTN Acute on chronic diastolic HF -  On torsemide -   AKI on CKD stage IV -poor candidate for long-term dialysis, nephrology has declined him for long-term HD -Started on daily torsemide for fluid management.  Will likely help keep him off BiPAP.  Goals of Care: - agree with ongoing palliative care discussions  - on amlodipine, hydralazine.  - switch metoprolol to carvedilol for better BP control.    Best practice:  Diet: EN per RDN via cortrak (nepro, carb steady) Pain/Anxiety/Delirium protocol (if indicated): na VAP protocol (if indicated): na DVT prophylaxis: heparin SQ GI prophylaxis: PPI Glucose control: Type 2 diabetes with adequate control on current regimen. Code Status: Full. Family Communication: Attempted to call daughter today with no answer.  Ongoing palliative  care discussions.  Overall long-term prognosis is not good. Disposition: transfer to progressive care as clinically no change in respiratory status. Mental status likely to improve somewhat over time.     Kipp Brood, MD [facility] ICU Physician [place]  Pager: [phone] Mobile: [phone] After hours:  [phone].   02/13/2020 9:03 AM

## 2020-02-14 ENCOUNTER — Inpatient Hospital Stay (HOSPITAL_COMMUNITY): Payer: Medicare (Managed Care)

## 2020-02-14 LAB — POCT I-STAT 7, (LYTES, BLD GAS, ICA,H+H)
Acid-Base Excess: 7 mmol/L — ABNORMAL HIGH (ref 0.0–2.0)
Bicarbonate: 32 mmol/L — ABNORMAL HIGH (ref 20.0–28.0)
Calcium, Ion: 1.27 mmol/L (ref 1.15–1.40)
HCT: 31 % — ABNORMAL LOW (ref 39.0–52.0)
Hemoglobin: 10.5 g/dL — ABNORMAL LOW (ref 13.0–17.0)
O2 Saturation: 97 %
Potassium: 4.2 mmol/L (ref 3.5–5.1)
Sodium: 140 mmol/L (ref 135–145)
TCO2: 33 mmol/L — ABNORMAL HIGH (ref 22–32)
pCO2 arterial: 48.1 mmHg — ABNORMAL HIGH (ref 32.0–48.0)
pH, Arterial: 7.431 (ref 7.350–7.450)
pO2, Arterial: 88 mmHg (ref 83.0–108.0)

## 2020-02-14 LAB — BASIC METABOLIC PANEL
Anion gap: 10 (ref 5–15)
BUN: 127 mg/dL — ABNORMAL HIGH (ref 8–23)
CO2: 29 mmol/L (ref 22–32)
Calcium: 9.4 mg/dL (ref 8.9–10.3)
Chloride: 99 mmol/L (ref 98–111)
Creatinine, Ser: 2.21 mg/dL — ABNORMAL HIGH (ref 0.61–1.24)
GFR calc Af Amer: 33 mL/min — ABNORMAL LOW (ref >60)
GFR calc non Af Amer: 29 mL/min — ABNORMAL LOW (ref >60)
Glucose, Bld: 164 mg/dL — ABNORMAL HIGH (ref 70–99)
Potassium: 3.9 mmol/L (ref 3.5–5.1)
Sodium: 138 mmol/L (ref 135–145)

## 2020-02-14 LAB — GLUCOSE, CAPILLARY
Glucose-Capillary: 162 mg/dL — ABNORMAL HIGH (ref 70–99)
Glucose-Capillary: 168 mg/dL — ABNORMAL HIGH (ref 70–99)
Glucose-Capillary: 209 mg/dL — ABNORMAL HIGH (ref 70–99)
Glucose-Capillary: 170 mg/dL — ABNORMAL HIGH (ref 70–99)
Glucose-Capillary: 142 mg/dL — ABNORMAL HIGH (ref 70–99)
Glucose-Capillary: 160 mg/dL — ABNORMAL HIGH (ref 70–99)

## 2020-02-14 MED ORDER — HYDRALAZINE HCL 50 MG PO TABS
50.0000 mg | ORAL_TABLET | Freq: Four times a day (QID) | ORAL | Status: DC
  Administered 2020-02-14 – 2020-02-17 (×14): 50 mg
  Filled 2020-02-14 (×14): qty 1

## 2020-02-14 MED ORDER — INSULIN ASPART 100 UNIT/ML ~~LOC~~ SOLN
6.0000 [IU] | SUBCUTANEOUS | Status: DC
  Administered 2020-02-14 – 2020-03-10 (×94): 6 [IU] via SUBCUTANEOUS

## 2020-02-14 NOTE — Progress Notes (Signed)
Physical Therapy Treatment Patient Details Name: [name] MRN: [id number] DOB: 1945-10-21 Today's Date: 02/14/2020    History of Present Illness Pt is a 74 y.o. male with PMH of HTN, PAF, SSS, CHF, tachycardia, Heart failure, cerebral artery occlusion with cerebral infarction, OSA, chronic obstructive lung disease, pulmonary edema, DM, type II, CVA, DJD, stage 3 chronic kidney disease, AKI, Gout, depression, blindness, coarse tremors, CAD, L mid foot amputation, amd pacemaker. Pt sent to ED from outpatient imaging when pt had syncope episode, urinated on self, and became confused. CT reveals stable hemorrhage layering in each occipital horn of the lateral ventricles, stable. Pt intubated on 6/17 and extubated on 6/24.    PT Comments    Pt remains limited by lethargy and inability to follow commands. Pt does not open eyes this session and communication is significantly limited with the pt making minimal verbal attempts at communication which are very difficult to comprehend while on BiPAP (pt may just be moaning). PT attempts to arouse the pt with changes in position of the bed, painful stimuli, and with PROM of all extremities, however the pt is unable to follow commands and remains lethargic for the entire session. Due to lack of progress with PT services and continued lethargy PT is reducing acute PT frequency at this time. PT continues to recommend SNF placement at this time.  Follow Up Recommendations  SNF;Supervision/Assistance - 24 hour     Equipment Recommendations  Hospital bed;Wheelchair (measurements PT);Wheelchair cushion (measurements PT)    Recommendations for Other Services       Precautions / Restrictions Precautions Precautions: Fall Precaution Comments: pt is Blind Restrictions Weight Bearing Restrictions: No    Mobility  Bed Mobility Overal bed mobility: Needs Assistance             General bed mobility comments: pt assisted into bed in chair mode for  more upright positioning and attempts at arousal  Transfers                    Ambulation/Gait                 Stairs             Wheelchair Mobility    Modified Rankin (Stroke Patients Only) Modified Rankin (Stroke Patients Only) Pre-Morbid Rankin Score: Moderately severe disability Modified Rankin: Severe disability     Balance                                            Cognition Arousal/Alertness: Lethargic Behavior During Therapy: Flat affect Overall Cognitive Status: Difficult to assess                                        Exercises Other Exercises Other Exercises: PROM BLE ankle PF/DF, knee flexion/extension, hip flexion and internal rotation for 10 repetitions Other Exercises: PROM composite finger flexion/extension, wrist flexion/extension, elbow flexion/extension, and shoulder flexion 10 repetitions. Pt does seem to automatically flex shoulder and elbow initially during PROM of UE but this subsides despite PT cues to continue doing so    General Comments General comments (skin integrity, edema, etc.): pt on BiPAP upon PT arrival, per RN attempted to wean to nasal canula this morning briefly which was unsuccessful      Pertinent  Vitals/Pain Pain Assessment: Faces Faces Pain Scale: Hurts little more Pain Location: R knee Pain Descriptors / Indicators: Grimacing Pain Intervention(s): Monitored during session    Home Living                      Prior Function            PT Goals (current goals can now be found in the care plan section) Acute Rehab PT Goals Patient Stated Goal: unable Progress towards PT goals: Not progressing toward goals - comment (limited due to lethargy, unable to follow commands)    Frequency    Min 2X/week      PT Plan Frequency needs to be updated    Co-evaluation              AM-PAC PT "6 Clicks" Mobility   Outcome Measure  Help needed turning  from your back to your side while in a flat bed without using bedrails?: Total Help needed moving from lying on your back to sitting on the side of a flat bed without using bedrails?: Total Help needed moving to and from a bed to a chair (including a wheelchair)?: Total Help needed standing up from a chair using your arms (e.g., wheelchair or bedside chair)?: Total Help needed to walk in hospital room?: Total Help needed climbing 3-5 steps with a railing? : Total 6 Click Score: 6    End of Session Equipment Utilized During Treatment: Oxygen Activity Tolerance: Patient limited by lethargy Patient left: in bed;with call bell/phone within reach;with bed alarm set Nurse Communication: Mobility status;Need for lift equipment PT Visit Diagnosis: Other symptoms and signs involving the nervous system (X83.338)     Time: 3291-9166 PT Time Calculation (min) (ACUTE ONLY): 20 min  Charges:  $Therapeutic Exercise: 8-22 mins                     Zenaida Niece, PT, DPT Acute Rehabilitation Pager: [phone]    Zenaida Niece 02/14/2020, 9:47 AM

## 2020-02-14 NOTE — Progress Notes (Signed)
RT took patient off bipap and placed patient on 10L [place] per MD. Once off bipap patient developed increased work of breathing and some desaturations into the 80's. RT placed patient back on bipap. Patient's work of breathing immediately decreased and patient seems more comfortable on bipap. RN at bedside and aware. RT will continue to monitor.

## 2020-02-14 NOTE — Progress Notes (Signed)
Palliative:  HPI:73 y.o.malewith past medical history of stroke requiring trach/PEG previously, HFpEF, atrial fibrillation, severe aortic stenosis s/p TAVR, CKD stage IV, hypertension, OHS/OSA uses CPAP, diabetes, s/p left foot amputationadmitted on 6/11/2021with ongoing headache and CT head bilateral intraventricular hemorrhage.Hospitalization complicated by ongoing poor mental status, aspiration pneumonia, hypertension, and concern for airway protection.Required intubation 6/17/21and extubated 02/07/20.   I met again today at [name]' bedside. No family present. He continues on BiPAP. Discussed with bedside RN and he is unable to wean from BiPAP to nasal cannula today. He is very lethargic. Not following commands today. I did spend time discussing with internal medicine team my progress with conversations which have been very little progress so far. [name] is HCPOA but not making decisions without the support of her 4 other siblings. I have had conversations with [name] as well. I have made MULTIPLE attempts to arrange conference call or meeting with children to discuss goals of care.   I called and spoke with [name] today. I reiterated my concern that her father is worse today. I reiterated my concern that he is high risk to decline further and that I have been speaking with her sister, [name], regarding decisions to be made. I explained that he is in very poor health with no reversible conditions to get him to a better quality of life. I believe him high risk for repeat intubation and I fear that further intubation would only serve to prolong his life in very poor quality. I urge family to consider alternative of allowing him peace and comfort. [name] shares she will speak with his siblings.   Unfortunately I am not sure what else I can do to assist in this situation. I have had this same conversation since 01/29/20. I will reassess situation when I return Tuesday 02/19/20.   Exam: Less  responsive and alert today. Not following commands. BiPAP dependent today. Breathing regular, unlabored on BiPAP. Abd large but soft. Generalized weakness.   Plan: - Family not able to make any decisions for limitations in aggressiveness of care.   [place], NP Palliative Medicine Team Pager [phone] (Please see [email or web address] for schedule) Team Phone [phone]    Greater than 50%  of this time was spent counseling and coordinating care related to the above assessment and plan

## 2020-02-14 NOTE — Progress Notes (Signed)
Nutrition Follow-up  DOCUMENTATION CODES:   Obesity unspecified  INTERVENTION:   Tube feeding via cortrak: Glucerna 1.2 at 60 ml/h (1440 ml per day) Pro-stat 60 ml BID  Provides 2128 kcal, 146 gm protein, 1167 ml free water daily  200 ml free water every 4 hours Total free water: 2367 ml   NUTRITION DIAGNOSIS:   Inadequate oral intake related to inability to eat as evidenced by NPO status.  Ongoing.   GOAL:   Patient will meet greater than or equal to 90% of their needs  Meeting with TF.   MONITOR:   Weight trends, Labs, I & O's, TF tolerance  REASON FOR ASSESSMENT:   Consult Enteral/tube feeding initiation and management  ASSESSMENT:   Pt with a PMH significant for Afib, cardiomyopathy, PPM, coronary disease, HTN, legally blind, T2DM, HTN, and HLD presented with N/V, headache, recent fall, recent syncope and AMS. Pt admitted with ICH.  Pt discussed during ICU rounds and with RN. Pt continues to require BiPAP support due to increased WOB on [place].  Per MD usual weight in heart failure clinic is 330 lb; pt down to 303 lb today.   6/24 extubated to bipap  6/14 cortrak, tip gastric  Medications reviewed and include: colace, 6 units novolog every 4 hours, 60 units levemir BID, miralax  Labs reviewed: BUN/Cr: 127/2.21 CBG's: 164-195-162-168  UOP: 2790 ml   Diet Order:   Diet Order    None      EDUCATION NEEDS:   Not appropriate for education at this time  Skin:  Skin Assessment: Skin Integrity Issues: Skin Integrity Issues::  (pressure injury: nose) Other: amputation all L toes  Last BM:  6/29  Height:   Ht Readings from Last 1 Encounters:  01/31/20 5\' 9"  (1.753 m)    Weight:   Wt Readings from Last 1 Encounters:  02/10/20 (!) 137.5 kg    Ideal Body Weight:  72.7 kg  BMI:  Body mass index is 44.76 kg/m.  Estimated Nutritional Needs:   Kcal:  1900-2100  Protein:  140-160 grams  Fluid:  2 L/day  Lockie Pares., RD, LDN, CNSC See AMiON  for contact information

## 2020-02-14 NOTE — Progress Notes (Addendum)
Subjective:   [name] is a 74 yo male with blnidness, HFpEF, AS s/p TAVR 9/20, atrial fibrillation not on anticoagulation due to previous GI bleed & ICH, OSA, HTN, LBBB w/pacemaker, morbid obesity and type II diabetes Patient is a transfer back to IMTS service from the ICU. He is a 74yo male admitted 01/25/20 with small subacute intracranial hemorrhage after recurrent falls requiring cleviprex gtt for blood pressure control. Patient developed AKI on CKD, worening AMS and aspiration pneumonia, requiring intubation and transfer to the ICU on 6/16.   He underwent a bronchoscopy the following day which showed right middle lobe mucous plugging. Blood an sputum cultures remained negative throughout his hospital stay, and he has been treated with broad spectrum antibiotics. He was extubated 6/24 to bipap and as of 6/31 was  On 5L [place].   Long conversation with daughter [name] today about prognosis and understanding of the patient's condition. See Goals of Care under plan.   Objective:  Vital signs in last 24 hours: Vitals:   02/14/20 0400 02/14/20 0434 02/14/20 0500 02/14/20 0600  BP: (!) 157/81 (!) 165/93 (!) 182/85 (!) 172/97  Pulse: 66 70 72 67  Resp: (!) 21 (!) 22 (!) 23 (!) 23  Temp: 98.3 F (36.8 C)     TempSrc: Axillary     SpO2: 99% 99% 100% 100%  Weight:      Height:        Constitution: NAD, chronically ill appearing, obese HENT: [place], AT, bipap in place Cardio: RRR, no m/r/g, no LE edema  Respiratory: decreased breath sounds right, no w/r/r Abdominal: NTTP, soft, non-distended Neuro: moves to physical stimuli, not following commands or communicating  Skin: c/d/i   Assessment/Plan:  Principal Problem:   ICH (intracerebral hemorrhage) (HCC) Active Problems:   Intraventricular hemorrhage (HCC)   Goals of care, counseling/discussion   Palliative care by specialist   Chronic respiratory failure with hypoxia and hypercapnia (HCC)   Acute on chronic respiratory failure with  hypoxia and hypercapnia (HCC)   Aspiration into airway   Acute respiratory failure [facility])   [name] is a 74 yo male with blnidness, HFpEF, AS s/p TAVR 9/20, atrial fibrillation not on anticoagulation due to previous GI bleed & ICH (on asa prior to admission), OSA, HTN, LBBB w/pacemaker, morbid obesity and type II diabetes admitted 6/11 with small. Patient is a transfer back to IMTS service from the ICU.  Acute Hypercarbic and Hypoxemic Respiratory Failure HFpEF Extubated 6/24, intermittently requiring bipap still. He spent most of yesterday on bipap but prior to this was doing well on [place] around 5L. Xr even shortly after admission showed pulmonary edema despite electrolytes demonstrating free water deficits, and would not rely too much on chest xr in determining fluid status. Last echo 6/13 showed EF 60% with grade 1 diastolic dysfunction. Diuresis limited by worsening renal function, hypernatremia. Dry weight previous 320-330 per HF notes this past march. Weight today 303 lbs. Overall net positive 9L since 6/17 but unclear if this is accurate.   - work on weaning to [place] today - unable to wean to [place] and back on bipap - duonebs tid - cont. bipap qhs - cont. Torsemide 20 mg qd  - chest CT   AKI on CKD Stage IV Nephrology previously consulted during hospital stay and he was deemed not to be a candidate for HD.  He is currently receiving free water 200 cc q4h and torsemide 20 mg qd. Creatinine appears near baseline pior to admission although  BUN is elevated at 127.   - cont. Torsemide 20 mg qd - cont. Free water 200 ccq4h   Altered Mental Status  Disposition and Goals of Care Since extubation he has remained unchanged, not following commands or communicating today or in prior notes. With AMS prior to intubation, his [name] and SAH were thought to be too small to be causing change in mental status.  Palliative care is consulted and great appreciate recommendations and ongoing discussions with  the family. The family has been unable to come together for a conference and prefer to continue aggressive care at this time. I also had a long discussion with his daughter, [name] this morning, who is power of attorney. She and her four siblings are following his care and she communicates daily updates. Her sister [name] also comes during the week to visit. [name] states he has been following some commands for them. She and the family are taking things day by day and wish to see whether he will continue to improve. Discussed that he does have a poor prognosis with respiratory status and decreased mentation.    - f/u palliative care  - cont. PT/OT - recommending SNF - cont. Cortrak, tube feeds, free water  Intraventricular Hemorrhage and small L SAH Last CT 6/16 showed stable small left SAH and stable mild ventricular enlargement. BP currently uncontrolled today at 008 systolic but appears prior to this pressures have been ranging from 676-195 systolic. Will hold off on further imaging for now.   - neurology signs off 6/16, recommend bp goal <093 systolic with long term goal of normotension. - bp goal <160, currently uncontrolled, increasing hydralazine. - consider repeat CT if patient continues to have no improvement in mentation   Hypertension On norsvasc 10 mg qd, coreg 12.5 mg bid, hydralazine 50 mg q8h, and torsemide 20 gm qd. PRN medications include labetalol 2-67 mg T24P systolic >809 and 10 mg hydralazine IV prn systolic >983 J8S.  - increase hydralazine 50 mg from q8h to q6h  - cont. Prns.  - cont other antihypertensives   TIIDM On resistant ssi q4h, novolog 4U q4h, levemir 60U bid.   - increase novolog to 6U q4h  VTE: SCDs IVF: none Diet: tube feeds Code: full  Dispo: Anticipated discharge pending medical improvement.   Leanette Fletcher A, DO 02/14/2020, 7:07 AM Pager: [phone] After 5pm on weekdays and 1pm on weekends: On Call Pager: [phone]

## 2020-02-14 NOTE — Progress Notes (Signed)
Pt transported from 4N18 to CT and back  on NIV. No complications noted.

## 2020-02-15 ENCOUNTER — Inpatient Hospital Stay (HOSPITAL_COMMUNITY): Payer: Medicare (Managed Care)

## 2020-02-15 LAB — COMPREHENSIVE METABOLIC PANEL
ALT: 32 U/L (ref 0–44)
AST: 19 U/L (ref 15–41)
Albumin: 2.8 g/dL — ABNORMAL LOW (ref 3.5–5.0)
Alkaline Phosphatase: 78 U/L (ref 38–126)
Anion gap: 12 (ref 5–15)
BUN: 123 mg/dL — ABNORMAL HIGH (ref 8–23)
CO2: 27 mmol/L (ref 22–32)
Calcium: 9.5 mg/dL (ref 8.9–10.3)
Chloride: 99 mmol/L (ref 98–111)
Creatinine, Ser: 2.15 mg/dL — ABNORMAL HIGH (ref 0.61–1.24)
GFR calc Af Amer: 35 mL/min — ABNORMAL LOW (ref >60)
GFR calc non Af Amer: 30 mL/min — ABNORMAL LOW (ref >60)
Glucose, Bld: 171 mg/dL — ABNORMAL HIGH (ref 70–99)
Potassium: 3.9 mmol/L (ref 3.5–5.1)
Sodium: 138 mmol/L (ref 135–145)
Total Bilirubin: 0.7 mg/dL (ref 0.3–1.2)
Total Protein: 7.4 g/dL (ref 6.5–8.1)

## 2020-02-15 LAB — CBC WITH DIFFERENTIAL/PLATELET
Abs Immature Granulocytes: 0.18 10*3/uL — ABNORMAL HIGH (ref 0.00–0.07)
Basophils Absolute: 0.1 10*3/uL (ref 0.0–0.1)
Basophils Relative: 1 %
Eosinophils Absolute: 0.2 10*3/uL (ref 0.0–0.5)
Eosinophils Relative: 2 %
HCT: 31.7 % — ABNORMAL LOW (ref 39.0–52.0)
Hemoglobin: 9.6 g/dL — ABNORMAL LOW (ref 13.0–17.0)
Immature Granulocytes: 2 %
Lymphocytes Relative: 12 %
Lymphs Abs: 1.3 10*3/uL (ref 0.7–4.0)
MCH: 25 pg — ABNORMAL LOW (ref 26.0–34.0)
MCHC: 30.3 g/dL (ref 30.0–36.0)
MCV: 82.6 fL (ref 80.0–100.0)
Monocytes Absolute: 1.1 10*3/uL — ABNORMAL HIGH (ref 0.1–1.0)
Monocytes Relative: 10 %
Neutro Abs: 8.3 10*3/uL — ABNORMAL HIGH (ref 1.7–7.7)
Neutrophils Relative %: 73 %
Platelets: 123 10*3/uL — ABNORMAL LOW (ref 150–400)
RBC: 3.84 MIL/uL — ABNORMAL LOW (ref 4.22–5.81)
RDW: 18.2 % — ABNORMAL HIGH (ref 11.5–15.5)
WBC: 11 10*3/uL — ABNORMAL HIGH (ref 4.0–10.5)
nRBC: 0.2 % (ref 0.0–0.2)

## 2020-02-15 LAB — GLUCOSE, CAPILLARY
Glucose-Capillary: 183 mg/dL — ABNORMAL HIGH (ref 70–99)
Glucose-Capillary: 163 mg/dL — ABNORMAL HIGH (ref 70–99)
Glucose-Capillary: 171 mg/dL — ABNORMAL HIGH (ref 70–99)
Glucose-Capillary: 125 mg/dL — ABNORMAL HIGH (ref 70–99)
Glucose-Capillary: 106 mg/dL — ABNORMAL HIGH (ref 70–99)
Glucose-Capillary: 149 mg/dL — ABNORMAL HIGH (ref 70–99)

## 2020-02-15 LAB — CBC
HCT: 31.4 % — ABNORMAL LOW (ref 39.0–52.0)
Hemoglobin: 9.3 g/dL — ABNORMAL LOW (ref 13.0–17.0)
MCH: 24.7 pg — ABNORMAL LOW (ref 26.0–34.0)
MCHC: 29.6 g/dL — ABNORMAL LOW (ref 30.0–36.0)
MCV: 83.3 fL (ref 80.0–100.0)
Platelets: 119 10*3/uL — ABNORMAL LOW (ref 150–400)
RBC: 3.77 MIL/uL — ABNORMAL LOW (ref 4.22–5.81)
RDW: 17.9 % — ABNORMAL HIGH (ref 11.5–15.5)
WBC: 10.5 10*3/uL (ref 4.0–10.5)
nRBC: 0 % (ref 0.0–0.2)

## 2020-02-15 MED ORDER — SODIUM CHLORIDE 0.9% FLUSH: 10.0000 mL | INTRAVENOUS | Status: DC | PRN

## 2020-02-15 MED ORDER — SODIUM CHLORIDE 0.9% FLUSH
10.0000 mL | Freq: Two times a day (BID) | INTRAVENOUS | Status: DC
  Administered 2020-02-15 – 2020-03-10 (×45): 10 mL

## 2020-02-15 NOTE — Progress Notes (Signed)
EEG completed, results pending. 

## 2020-02-15 NOTE — Plan of Care (Signed)
  Problem: Nutrition: Goal: Adequate nutrition will be maintained Outcome: Progressing   

## 2020-02-15 NOTE — Progress Notes (Signed)
Patient transported to CT and back on bipap without complications. RN at bedside.

## 2020-02-15 NOTE — Progress Notes (Signed)
Contacted patients daughter Clement Deneault about patients status and finds from today. Discussed finding no reversible causes and lack of progress. Inquired about families wishes on further care vs comfort care.   Daughter reported wanting to discuss plans going forward with patients other children. Discussed consulting pulmonology again. Daughter wishes use to consult with pulmonology again.  Told patient to call back when a decision had been made.

## 2020-02-15 NOTE — Progress Notes (Addendum)
Subjective: Examined patient at bedside. Patient lying in bed with BiPAP on.  Patient opens eyes to voice. Is not able to follow verbal commands. Patient does respond to pain with voluntary guarding with only left arm movement. Patient initially did not respond to name, but did later on in interaction.  Pupils are dilated. Absent direct and indirect pupil response.  Negative patellar reflex bilaterally.  Objective:  Vital signs in last 24 hours: Vitals:   02/15/20 0526 02/15/20 0600 02/15/20 0609 02/15/20 0635  BP: (!) 153/64 (!) 170/75 (!) 172/79 (!) 157/65  Pulse:  70 69 74  Resp:  20 19 (!) 21  Temp:      TempSrc:      SpO2:  98% 97% 99%  Weight:      Height:       Physical Exam HENT:     Head: Normocephalic and atraumatic.  Eyes:     Pupils:     Right eye: Pupil is not reactive.     Left eye: Pupil is not reactive.     Comments: No eye movement elicited on exam.  Cardiovascular:     Rate and Rhythm: Normal rate and regular rhythm.     Pulses: Normal pulses.          Radial pulses are 2+ on the right side and 2+ on the left side.       Dorsalis pedis pulses are 2+ on the right side and 2+ on the left side.     Heart sounds: Normal heart sounds, S1 normal and S2 normal. No murmur heard.   Pulmonary:     Effort: No respiratory distress.     Comments: On BiPAP Abdominal:     Tenderness: There is no guarding.  Neurological:     Mental Status: He is unresponsive.     Deep Tendon Reflexes:     Reflex Scores:      Patellar reflexes are 0 on the right side and 0 on the left side.    Comments: Responds to painful stimuli      Assessment/Plan: [name] is a 74 yr old male with a PMHx of SSS s/p pacemaker, P-Afib, GI bleed, ICH, dHF, CKD stage IV, DM, and HTN   Principal Problem:   ICH (intracerebral hemorrhage) (HCC) Active Problems:   Intraventricular hemorrhage (HCC)   Encephalopathy   Goals of care, counseling/discussion   Palliative care by  specialist   Chronic respiratory failure with hypoxia and hypercapnia (HCC)   Acute on chronic respiratory failure with hypoxia and hypercapnia (HCC)   Aspiration into airway   Acute respiratory failure ([place])  Acute Hypercarbic and Hypoxemic Respiratory Failure  Aspiration PNA - resolved Was Extubated on 6/24. He has been off and on BiPAP over the last few days failing nasal canula multiple times. -Continue BiPAP -Consult pulmonology for possible reintubation  AKI on CKD Stage IV: Per Nephrology consult during admission dialysis is not recommended. -Continue 200cc free water q4 -Continue torsemide 20 mg daily   Altered Mental Status  Disposition and Goals of Care: Per previous notes he has not changed since extubation and was altered before intubation. He does not follow commands and is unresponsive to most stimuli except pain. Per previous note he was following some commands for his family. -continue palliative care discussions. -consult with pulmonology about possible reintubation -continue NGT feedings and free water   Intraventricular Hemorrhage and small L SAH: Obtained new CT (7/2) to look for cerebral edema or anoxic injury.  Unable to get brain MRI with pacemaker.  -CT showed no signs of acute or evolving anoxic injury. No signs of new intracranial hemorrhage.   Hypertension: Continue amlodipine, carvedilol, and hydralazine.  T2DM: Continue checking glucose levels and using novoLOG sliding scale  VTE: SCDs IVF: none Diet: tube feeds Code: full   Prior to Admission Living Arrangement: Anticipated Discharge Location: Barriers to Discharge: Dispo: Anticipated discharge in approximately 3-5 day(s).   Ryan Cedar, MD 02/15/2020, 7:09 AM Pager: [phone] After 5pm on weekdays and 1pm on weekends: On Call pager [phone]

## 2020-02-15 NOTE — Procedures (Signed)
[facility]  Routine Inpatient Electroencephalogram (EEG)  Indication: Encephalopathy  Date of study: 0/09/2019  Brief History: 74 year old male with a history of stroke, atrial fibrillation, chronic kidney disease was noted to have intraventricular hemorrhage and has persistent altered mental status.  Description:   This is a routine inpatient EEG using the International Standard 10-20 system of electrode placement. Photic stimulation and hyperventilation were deferred.  The background showed theta slowing at 4-5 hertz, with also diffuse theta slowing as well. There were no definitive epileptiform discharges or electrographic seizures seen.  Impression:   This is an abnormal EEG due to the presence of moderate to severe diffuse slowing.  Findings are suggestive of an underlying encephalopathy possibly related to toxic, metabolic conditions, versus diffuse structural brain abnormalities.  The absence of epileptiform discharges does not necessarily rule out an underlying seizure disorder.  Clinical correlation is advised.

## 2020-02-15 NOTE — Progress Notes (Signed)
NAME:  [name], MRN:  [id number], DOB:  [birth year], LOS: 21 ADMISSION DATE:  01/25/2020, CONSULTATION DATE:  01/31/2020 REFERRING MD:  Graciella Freer, CHIEF COMPLAINT:  Worsening AMS/ hypoxia  Brief History   55 yoM admitted 6/11 with small bilateral [name] requiring intermittent cleviprex for strict BP control, with hospitalization complicated by likely aspiration pneumonia, AKI on CKD, and worsening mental status  and desaturation episodes with increased O2 needs.  Mental status prevents BiPAP.  Intubated for hypercarbic respiratory failure  History of present illness   HPI obtained from medical chart review as patient is unresponsive.   74 year old male with prior extensive medical history significant for blindness, prior ICH with previous trach/ PEG with some baseline aphasia, HFpEF, AS s/p TAVR 04/2019, afib not on AC due to GI bleeding and ICH, CAD, OSA, CKD stage IV, HTN, morbid obesity, and poorly controlled DMT2 with neuropathy who presented 6/11 with 3 day history of bifrontal headache, recent falls 2-3 weeks prior, increasing confusion/ syncope and nausea/ vomiting.  Found on workup to have small bilateral intraventricular hemorrhage of unclear etiology, suspected to be traumatic given fall history vs spontaneous.   MRI unable to be obtained given pacemaker.  Admitted to IMTS with neurology consulting.  Required intermittent cleviprex gtt for strict BP control. Hospitalization complicated by suspected aspiration pneumonia on 6/13 and started empirically on antibiotics, and worsening AKI on CKD, and progressive decline in mental status.  Serial imaging mostly stable.  Palliative care consulted to help with goals of care and Overnight, 6/16, he had worsening mental status , increased O2 needs, and desaturations. ABG obtained showed 7.306/ 68/ 72/ 33.5.   PCCM consulted for further management of respiratory failure.   Past Medical History  Blindness, OSA, prior ICH with previous trach/ PEG, HFpEF,  afib, AS s/p TAVR 04/2019, CAD, CKD stage IV, HTN, DMT2 poorly controlled with neuropathy, morbid obesity, HLD, GERD, bradycardia/ LBBB with pacemaker,   Significant Hospital Events   6/11 admitted IMTS to ICU, intermittently requiring cleviprex 6/13 concern for aspiration, abx started 6/15 requiring cleviprex gtt 6/17 worsening mental status/ hypoxia- intubated 6/24 extubated to BiPAP 6/25 started on dexmet overnight for reported agitation. Remains on BiPAP. Discontinuing BiPAP during day shift   Consults:  Neurology 6/11 Palliative care 6/15 PCCM 6/17  Procedures:  6/14 right nare cortrak >> 6/17 ETT >> 6/17 bronchoscopy-> RML mucous plug   Significant Diagnostic Tests:  6/11 CTH >> There is hemorrhage in the dependent portion of each lateral ventricle of uncertain etiology. No brain parenchymal hemorrhage elsewhere noted. No mass. No extra-axial fluid.  There is mild atrophy with extensive periventricular small vessel disease. No acute appearing infarct is evident.  There are multiple foci of arterial vascular calcification. There is mucosal thickening in several ethmoid air cells.  6/11 RUQ limited US >> Gallbladder sludge and gallstones without complicating factors.  Right renal cysts stable from prior CT examination.  6/12 CTH >> unchanged [name] layering within the occipital horns of the lateral ventricles  6/13 CTH >> 1. Increased but still small volume intraventricular hemorrhage layering in the occipital horns of the lateral ventricles. 2. Borderline increase in ventriculomegaly, recommend follow-up.  6/13 TTE >> LVEF 60-65%, mod LVH, no wall motion abnormality, T5TD, RV systolic function normal, mild LA dilation, post TAVR, mild dilation of aortic root  6/14 CTH >>  Stable hemorrhage layering in each occipital horn of the lateral ventricles, stable. No hemorrhage appreciable. There is atrophy with extensive supratentorial small vessel disease, stable.  No acute infarct  evident. There is extensive arterial vascular calcification at multiple sites. There is mucosal thickening in multiple ethmoid air cells.  6/16 CTH >> Stable intraventricular hemorrhage. Small amount of subarachnoid hemorrhage on the left also stable. No new hemorrhage or infarct Mild ventricular enlargement is stable  6/17 CTH >>  Stable exam with small amount of bilateral intraventricular hemorrhage and subarachnoid overlying L parietal lobe.  No new hemorrhage   6/17 Renal US: no hydronephrosis. Increased cortical echogenicity of bilateral kidneys. Bilateral renal cysts. L cyst arising form inferior pole and complicated of internal areas with increased echogenicity   6/23: CXR BL congestion   Micro Data:  6/11 SARS 2 >> neg 6/15 MRSA PCR >> neg 6/15 BCx2 >> ngtd >> 6/17 BAL  >> nml flora  Antimicrobials:  6/13 azithro >> 6/14 6/13 ceftriaxone >> 6/14 6/14 unasyn >> 6/16 6/17 vanc >>6/18 6/17 cefepime >> 6/17 flagyl >>6/18  Interim history/subjective:   Asked to see again today because patient has been remained on BiPAP.  Has not tolerated nasal cannula.  Objective   Blood pressure (!) 154/81, pulse 67, temperature 98.9 F (37.2 C), temperature source Axillary, resp. rate 19, height _0  (1.753 m), weight (!) 145.9 kg, SpO2 97 %.    Vent Mode: BIPAP FiO2 (%):  [40 %] 40 % Set Rate:  [10 bmp] 10 bmp PEEP:  [7 cmH20] 7 cmH20   Intake/Output Summary (Last 24 hours) at 02/15/2020 1847 Last data filed at 02/15/2020 1500 Gross per 24 hour  Intake 2000 ml  Output 2525 ml  Net -525 ml   Filed Weights   02/07/20 0500 02/10/20 0500 02/15/20 0500  Weight: 134.5 kg (!) 137.5 kg (!) 145.9 kg    Examination: General: obese, chronically ill, on nasal cannula., alert to voice  HEENT: Nasal cannula, mepelix on nose Neuro: Opens eyes and follows commands today actually verbalizing more clearly. CV: RRR, s1 s2 PULM: Diminshed BL, no wheeze  GI: obese, soft, nt nd    Extremities: left forefoot amputation  Skin: no rash   Resolved Hospital Problem list    Assessment & Plan:    Was Critically ill due to acute hypercarbic acute hypoxic respiratory failure now status post intubation. Appears to be tolerating BiPAP at nighttime and nasal cannula during the day with decreasing oxygen requirements.  Unclear to me why he is not tolerating transition to nasal cannula.  His respiratory status will never look particularly good so we should still try to transition him daily. Overall he does not look worse to me from a respiratory standpoint. We will continue to follow from a distance. Given his poor overall prognosis would prefer to avoid intubation. Can try further diuresis based on chest x-ray.  Acute enchephalopathy likely multifactorial in setting of hypoxia/hypercarpia,  uremia, infection, CNS injury  -Continue to observe.  May improve some as intraventricular blood clears.  -Ongoing goals of care discussion with family.  [name] - small, bilateral, stable  -Supportive care at this time.   Likely aspiration PNA -has completed course of antibiotics.  HTN Acute on chronic diastolic HF -  On torsemide -   AKI on CKD stage IV -poor candidate for long-term dialysis, nephrology has declined him for long-term HD -Started on daily torsemide for fluid management.  Will likely help keep him off BiPAP.  Goals of Care: - agree with ongoing palliative care discussions  - on amlodipine, hydralazine.  - switch metoprolol to carvedilol for better BP control.  Best practice:  Diet: EN per RDN via cortrak (nepro, carb steady) Pain/Anxiety/Delirium protocol (if indicated): na VAP protocol (if indicated): na DVT prophylaxis: heparin SQ GI prophylaxis: PPI Glucose control: Type 2 diabetes with adequate control on current regimen. Code Status: Full. Family Communication: Attempted to call daughter today with no answer.  Ongoing palliative care discussions.   Overall long-term prognosis is not good. Disposition: transfer to progressive care as clinically no change in respiratory status. Mental status likely to improve somewhat over time.     Kipp Brood, MD [facility] ICU Physician [place]  Pager: [phone] Mobile: [phone] After hours: [phone].   02/15/2020 6:47 PM

## 2020-02-16 LAB — BASIC METABOLIC PANEL
Anion gap: 10 (ref 5–15)
BUN: 119 mg/dL — ABNORMAL HIGH (ref 8–23)
CO2: 29 mmol/L (ref 22–32)
Calcium: 9.5 mg/dL (ref 8.9–10.3)
Chloride: 102 mmol/L (ref 98–111)
Creatinine, Ser: 2.03 mg/dL — ABNORMAL HIGH (ref 0.61–1.24)
GFR calc Af Amer: 37 mL/min — ABNORMAL LOW (ref >60)
GFR calc non Af Amer: 32 mL/min — ABNORMAL LOW (ref >60)
Glucose, Bld: 156 mg/dL — ABNORMAL HIGH (ref 70–99)
Potassium: 4 mmol/L (ref 3.5–5.1)
Sodium: 141 mmol/L (ref 135–145)

## 2020-02-16 LAB — CBC
HCT: 31.9 % — ABNORMAL LOW (ref 39.0–52.0)
Hemoglobin: 9.3 g/dL — ABNORMAL LOW (ref 13.0–17.0)
MCH: 24.5 pg — ABNORMAL LOW (ref 26.0–34.0)
MCHC: 29.2 g/dL — ABNORMAL LOW (ref 30.0–36.0)
MCV: 83.9 fL (ref 80.0–100.0)
Platelets: 120 10*3/uL — ABNORMAL LOW (ref 150–400)
RBC: 3.8 MIL/uL — ABNORMAL LOW (ref 4.22–5.81)
RDW: 18.2 % — ABNORMAL HIGH (ref 11.5–15.5)
WBC: 10.6 10*3/uL — ABNORMAL HIGH (ref 4.0–10.5)
nRBC: 0 % (ref 0.0–0.2)

## 2020-02-16 LAB — GLUCOSE, CAPILLARY
Glucose-Capillary: 172 mg/dL — ABNORMAL HIGH (ref 70–99)
Glucose-Capillary: 139 mg/dL — ABNORMAL HIGH (ref 70–99)
Glucose-Capillary: 171 mg/dL — ABNORMAL HIGH (ref 70–99)
Glucose-Capillary: 133 mg/dL — ABNORMAL HIGH (ref 70–99)
Glucose-Capillary: 104 mg/dL — ABNORMAL HIGH (ref 70–99)
Glucose-Capillary: 118 mg/dL — ABNORMAL HIGH (ref 70–99)

## 2020-02-16 MED ORDER — FUROSEMIDE 10 MG/ML IJ SOLN
40.0000 mg | Freq: Once | INTRAMUSCULAR | Status: AC
  Administered 2020-02-16: 40 mg via INTRAVENOUS
  Filled 2020-02-16: qty 4

## 2020-02-16 MED ORDER — MORPHINE SULFATE (PF) 2 MG/ML IV SOLN
1.0000 mg | Freq: Once | INTRAVENOUS | Status: AC
  Administered 2020-02-16: 1 mg via INTRAVENOUS
  Filled 2020-02-16: qty 1

## 2020-02-16 NOTE — Progress Notes (Signed)
Had a discussion with daughter [name], discussed that [name] continues to not do well. Re-iterated that we have not found any reversible causes and that he continues to have high oxygen requirements, decreased responsiveness, and worsening kidney function. He is not a good candidate for dialysis per nephrology and has uremia. Discussed that we are mostly providing supportive measures however he continues to have no improvement. Given his multiple issues during hospitalization and poor recovery thus far he seems to have a very poor prognosis. She stated that she knew all of that and wanted to know what question I had for them. I stated that this is an ongoing discussion about the goals of care. I did bring up the option of comfort care and the discussion about his code status. She reported that she knew she did not want to change his code status, discussed that if we did pursue end of life/comfort care then he would need to be DNR. She expressed understanding. She reported that she was relay this information to her siblings to discuss about his code status and consideration of comfort care.

## 2020-02-16 NOTE — Progress Notes (Signed)
Internal Medicine Attending Note:  Patient is a 73 yo M with a pmhx of morbid obesity, OSA, HTN, Type II DM, HFpEF, LBBB with pacemaker, AS s/p TAVR, atrial fibrillation not on anticoagulation due to prior GI bleed, and blindness who was admitted on 6/11 with hypertensive emergency and ICH. Hospital course was complicated by AKI on CKD, encephalopathy, and aspiration PNA requiring intubation. He was extubated on 6/24 and transferred back to our service.   S: Somewhat more alert today, opens eyes to voice, moans to physical touch, but does not follow commands. Was transitioned off BiPAP this morning, wearing HFNC on my exam.   O: Blood pressure (!) 149/71, pulse 64, temperature 98.1 F (36.7 C), temperature source Axillary, resp. rate (!) 24, height 5\' 9"  (1.753 m), weight (!) 145.9 kg, SpO2 100 %.  Physical Exam Constitutional: Opens eyes to voice, moans to physical touch Cardiovascular: RRR, no murmurs, rubs, or gallops.  Pulmonary/Chest: Tachypnea, increased work of breathing, saturating 100% on HFNC. Distant breath sounds anteriorly. Extremities: Warm and well perfused. No edema.  Neurological: Altered, does not follow commands. Opens eyes to voice but does not track (patient is blind).   A&P:  Encephalopathy: Multifactorial in the setting of [place] / CNS injury, respiratory failure, and AKI with uremia. Repeat head CT 7/2 without anoxic injury. Unable to get MRI with pacemaker.  -- Continue to monitor; ongoing goals of care  Acute on Chronic hypercarbic and hypoxic respiratory failure: Extubated 6/24, has continued to require BiPAP intermittently. CXR yesterday with mild pulmonary edema, on po torsemide. Seen on HFNC this morning. Saturating well but significant work of breathing. Appreciate PCCM assistance. Continue BiPAP prn.   Acute on Chronic Diastolic HF:  On oral torsemide. With pulmonary edema seen on CXR and tachypnea will give an additional dose of IV lasix today. Strict I&Os.    AKI on CKD IV: Not a candidate for HD per nephrology. BMP not checked today, order placed.   Goals of Care: Appreciate palliative care's assistance. Our team has been talking with family as well. Unfortunately we have been unable to make progress with any decision regarding limiting medical care and aggressiveness. He remains full code and full scope of care. Overall poor prognosis. Will continue to address.    Velna Ochs, MD 02/16/2020, 2:08 PM

## 2020-02-16 NOTE — Progress Notes (Addendum)
NAME:  [name], MRN:  [id number], DOB:  07-27-1946, LOS: 31 ADMISSION DATE:  01/25/2020, CONSULTATION DATE:  01/31/2020 REFERRING MD:  Graciella Freer, CHIEF COMPLAINT:  Worsening AMS/ hypoxia  Brief History   74 yoM admitted 6/11 with small bilateral [name] requiring intermittent cleviprex for strict BP control, with hospitalization complicated by likely aspiration pneumonia, AKI on CKD, and worsening mental status  and desaturation episodes with increased O2 needs.  Mental status prevents BiPAP.  Intubated for hypercarbic respiratory failure  History of present illness   HPI obtained from medical chart review as patient is unresponsive.   74 year old male with prior extensive medical history significant for blindness, prior ICH with previous trach/ PEG with some baseline aphasia, HFpEF, AS s/p TAVR 04/2019, afib not on AC due to GI bleeding and ICH, CAD, OSA, CKD stage IV, HTN, morbid obesity, and poorly controlled DMT2 with neuropathy who presented 6/11 with 3 day history of bifrontal headache, recent falls 2-3 weeks prior, increasing confusion/ syncope and nausea/ vomiting.  Found on workup to have small bilateral intraventricular hemorrhage of unclear etiology, suspected to be traumatic given fall history vs spontaneous.   MRI unable to be obtained given pacemaker.  Admitted to IMTS with neurology consulting.  Required intermittent cleviprex gtt for strict BP control. Hospitalization complicated by suspected aspiration pneumonia on 6/13 and started empirically on antibiotics, and worsening AKI on CKD, and progressive decline in mental status.  Serial imaging mostly stable.  Palliative care consulted to help with goals of care and Overnight, 6/16, he had worsening mental status , increased O2 needs, and desaturations. ABG obtained showed 7.306/ 68/ 72/ 33.5.   PCCM consulted for further management of respiratory failure.   Past Medical History  Blindness, OSA, prior ICH with previous trach/ PEG, HFpEF,  afib, AS s/p TAVR 04/2019, CAD, CKD stage IV, HTN, DMT2 poorly controlled with neuropathy, morbid obesity, HLD, GERD, bradycardia/ LBBB with pacemaker,   Significant Hospital Events   6/11 admitted IMTS to ICU, intermittently requiring cleviprex 6/13 concern for aspiration, abx started 6/15 requiring cleviprex gtt 6/17 worsening mental status/ hypoxia- intubated 6/24 extubated to BiPAP 6/25 started on dexmet overnight for reported agitation. Remains on BiPAP. Discontinuing BiPAP during day shift  7/2 transferred back to ICU for persistent respiratory decline with inability to liberate from BiPAP  Consults:  Neurology 6/11 Palliative care 6/15 PCCM 6/17  Procedures:  6/14 right nare cortrak >> 6/17 ETT >> 6/17 bronchoscopy-> RML mucous plug   Significant Diagnostic Tests:  6/11 CTH >> There is hemorrhage in the dependent portion of each lateral ventricle of uncertain etiology. No brain parenchymal hemorrhage elsewhere noted. No mass. No extra-axial fluid.  There is mild atrophy with extensive periventricular small vessel disease. No acute appearing infarct is evident.  There are multiple foci of arterial vascular calcification. There is mucosal thickening in several ethmoid air cells.  6/11 RUQ limited US >> Gallbladder sludge and gallstones without complicating factors.  Right renal cysts stable from prior CT examination.  6/12 CTH >> unchanged [name] layering within the occipital horns of the lateral ventricles  6/13 CTH >> 1. Increased but still small volume intraventricular hemorrhage layering in the occipital horns of the lateral ventricles. 2. Borderline increase in ventriculomegaly, recommend follow-up.  6/13 TTE >> LVEF 60-65%, mod LVH, no wall motion abnormality, P2ZR, RV systolic function normal, mild LA dilation, post TAVR, mild dilation of aortic root  6/14 CTH >>  Stable hemorrhage layering in each occipital horn of the lateral  ventricles, stable. No hemorrhage  appreciable. There is atrophy with extensive supratentorial small vessel disease, stable. No acute infarct evident. There is extensive arterial vascular calcification at multiple sites. There is mucosal thickening in multiple ethmoid air cells.  6/16 CTH >> Stable intraventricular hemorrhage. Small amount of subarachnoid hemorrhage on the left also stable. No new hemorrhage or infarct Mild ventricular enlargement is stable  6/17 CTH >>  Stable exam with small amount of bilateral intraventricular hemorrhage and subarachnoid overlying L parietal lobe.  No new hemorrhage   6/17 Renal US: no hydronephrosis. Increased cortical echogenicity of bilateral kidneys. Bilateral renal cysts. L cyst arising form inferior pole and complicated of internal areas with increased echogenicity   6/23: CXR BL congestion   Chest CT 7/1 > Dependent atelectasis and trace pleural effusions.  Head CT 7/2 > Evidence of advanced chronic small vessel disease but no CT findings of acute or evolving anoxic injury. Trace residual intraventricular and subarachnoid hemorrhage,   Micro Data:  6/11 SARS 2 >> neg 6/15 MRSA PCR >> neg 6/15 BCx2 >> ngtd >> 6/17 BAL  >> nml flora  Antimicrobials:  6/13 azithro >> 6/14 6/13 ceftriaxone >> 6/14 6/14 unasyn >> 6/16 6/17 vanc >>6/18 6/17 cefepime >> 6/17 flagyl >>6/18  Interim history/subjective:  Seen lying in bed on BiPAP, will open eyes to verbal stimuli but unable to follow any commands  Objective   Blood pressure (!) 160/85, pulse 70, temperature 98.9 F (37.2 C), temperature source Axillary, resp. rate (!) 24, height _0  (1.753 m), weight (!) 145.9 kg, SpO2 100 %.    Vent Mode: BIPAP;PCV FiO2 (%):  [40 %] 40 % Set Rate:  [10 bmp] 10 bmp PEEP:  [7 cmH20] 7 cmH20   Intake/Output Summary (Last 24 hours) at 02/16/2020 0729 Last data filed at 02/16/2020 0600 Gross per 24 hour  Intake 880 ml  Output 1750 ml  Net -870 ml   Filed Weights   02/07/20 0500  02/10/20 0500 02/15/20 0500  Weight: 134.5 kg (!) 137.5 kg (!) 145.9 kg    Examination: General: Chronically ill appearing elderly obese male on BiPAP, in NAD HEENT: ETT, MM pink/moist, PERRL, sclera nonicteric Neuro: Opens eyes to verbal stimuli but unable to follow any commands, appears slightly encephalopathic CV: s1s2 regular rate and rhythm, no murmur, rubs, or gallops,  PULM: Diminished air entry bilaterally, no added breath sounds, no increased work of breathing, saturations 100% on BiPAP GI: soft, bowel sounds active in all 4 quadrants, non-tender, non-distended, tolerating TF Extremities: warm/dry, generalized nonpitting edema, left forefoot amputation Skin: no rashes or lesions  Resolved Hospital Problem list   Likely aspiration PNA  -has completed course of antibiotics.  Assessment & Plan:   Acute on chronic hypercarbic acute hypoxic respiratory failure now status post intubation. -Appears to be tolerating BiPAP at nighttime and nasal cannula during the day with decreasing oxygen requirements. Pulmonary edema P: Continue to attempt to remove BiPAP Could try high flow nasal cannula Given overall poor prognosis will try to avoid intubation Continue to diurese Encourage pulmonary hygiene Elevate head of bed  Acute enchephalopathy  -likely multifactorial in setting of hypoxia/hypercarpia,  uremia, infection, CNS injury  [name] - small, bilateral, stable -Repeat head CT 7/2 revealed evidence of advanced chronic small vessel disease but no CT findings of acute or evolving anoxic injury. Trace residual intraventricular and subarachnoid hemorrhage, P: Supportive care Hopeful for continued improvement as intraventricular blood clears Ongoing goals of care with family Minimize sedation Encourage sleep-wake  cycle  HTN Acute on chronic diastolic HF -Cardiomegaly and pulmonary edema seen on chest x-ray 7/2 P: Continue torsemide, consider additional diuretic dosing  today Close monitoring of volume status Strict intake and output  AKI on CKD stage IV  -poor candidate for long-term dialysis, nephrology has declined him for long-term HD P: Follow renal function / urine output Trend Bmet Avoid nephrotoxins, ensure adequate renal perfusion   Goals of care Again attempted to call both daughters and son with no answer 7/3.  Given patient's continued need of high levels of oxygen versus BiPAP and limited progression with multiple comorbidities continue discussions for goals of care needs to occur.  Palliative care remains involved.  Best practice:  Diet: EN per RDN via cortrak (nepro, carb steady) Pain/Anxiety/Delirium protocol (if indicated): na VAP protocol (if indicated): na DVT prophylaxis: heparin SQ GI prophylaxis: PPI Glucose control: Type 2 diabetes with adequate control on current regimen. Code Status: Full. Family Communication:   Johnsie Cancel, NP-C [facility] Contact / Pager information can be found on Amion  02/16/2020, 8:27 AM   PCCM Attending:   74 yo BL [name] on admission, asp pna, AKI on CKD 4, not a candidate for longterm iHD, obese, OSA and OHS at baseline. Re-evaluated for no progression while on BIPAP.   BP (!) 156/80   Pulse 73   Temp 99 F (37.2 C) (Axillary)   Resp (!) 21   Ht _0  (1.753 m) Comment: measured x 3  Wt (!) 145.9 kg   SpO2 100%   BMI 47.50 kg/m   Gen: obese elderly male HENT: large neck, no obvious jvd  Heart: RRR s1 s2 Lungs: diminished bl, no wheeze Abd: obese. Soft, ND   Labs reviewed:  Lab Results  Component Value Date   CREATININE 2.15 (H) 02/15/2020   BUN 123 (H) 02/15/2020   NA 138 02/15/2020   K 3.9 02/15/2020   CL 99 02/15/2020   CO2 27 02/15/2020   A: Acute hypoxemic and hypercarbic respiratory failure, BIPAP dependence at night  Obese, likely OHS and OSA  CKD4 Uremia  Not good candidate for longterm dialysis per nephrology   P: Unfortunately I dont  believe his respiratory status will get any better if his kidney function and UOP does not improve. If he remains uremic he will likely stay confused not to mention the previous neurologic insult he is recovering from. A clear GOC with family needs to occur.  I would encourage ongoing discussion with family.   Garner Nash, DO [facility] 02/16/2020 10:55 AM

## 2020-02-16 NOTE — Progress Notes (Signed)
Pt taken off bipap and placed on HHFNC at 35L/60%. Pt with accessory muscle usage, CCM NP notified.

## 2020-02-16 NOTE — Progress Notes (Signed)
Patient's son called requesting update on patient, provided update and answered questions. Informed son that providers had been trying to reach family regarding patient prognosis and had been having difficulty, son stated that he will be in to visit later in the afternoon. Will attempt to facilitate conversation with provider at that time.  Candy Sledge, RN

## 2020-02-17 LAB — BASIC METABOLIC PANEL
Anion gap: 9 (ref 5–15)
BUN: 107 mg/dL — ABNORMAL HIGH (ref 8–23)
CO2: 31 mmol/L (ref 22–32)
Calcium: 9.5 mg/dL (ref 8.9–10.3)
Chloride: 103 mmol/L (ref 98–111)
Creatinine, Ser: 1.98 mg/dL — ABNORMAL HIGH (ref 0.61–1.24)
GFR calc Af Amer: 38 mL/min — ABNORMAL LOW (ref >60)
GFR calc non Af Amer: 33 mL/min — ABNORMAL LOW (ref >60)
Glucose, Bld: 129 mg/dL — ABNORMAL HIGH (ref 70–99)
Potassium: 3.9 mmol/L (ref 3.5–5.1)
Sodium: 143 mmol/L (ref 135–145)

## 2020-02-17 LAB — GLUCOSE, CAPILLARY
Glucose-Capillary: 135 mg/dL — ABNORMAL HIGH (ref 70–99)
Glucose-Capillary: 110 mg/dL — ABNORMAL HIGH (ref 70–99)
Glucose-Capillary: 157 mg/dL — ABNORMAL HIGH (ref 70–99)
Glucose-Capillary: 103 mg/dL — ABNORMAL HIGH (ref 70–99)
Glucose-Capillary: 104 mg/dL — ABNORMAL HIGH (ref 70–99)

## 2020-02-17 NOTE — Plan of Care (Signed)
  Problem: Education: Goal: Knowledge of General Education information will improve Description: Including pain rating scale, medication(s)/side effects and non-pharmacologic comfort measures Outcome: Not Progressing   

## 2020-02-17 NOTE — Progress Notes (Addendum)
Subjective: Patient is still very disoriented, he was slightly more responsive today. He moved both of his arms to painful stimuli. He would turn his head when his name was said but not directionally appropriate. He could not follow commands.  Later in the day the nurse paged [place] that he was attempting to remove his CoreTak and had removed his nasal canula several times. He was placed on soft restraints because he cannot follow commands due to encephalopathy.   Objective:  Vital signs in last 24 hours: Vitals:   02/17/20 0726 02/17/20 0844 02/17/20 0850 02/17/20 1236  BP: (!) 152/85   (!) 145/84  Pulse: 62   64  Resp:    20  Temp: 97.6 F (36.4 C)   98.5 F (36.9 C)  TempSrc: Oral   Oral  SpO2:  100% 100% 96%  Weight:      Height:       Physical Exam Vitals and nursing note reviewed.  Constitutional:      General: He is not in acute distress.    Appearance: He is obese.  HENT:     Head: Normocephalic and atraumatic.  Eyes:     Pupils:     Right eye: Pupil is not reactive.     Left eye: Pupil is not reactive.  Cardiovascular:     Rate and Rhythm: Normal rate and regular rhythm.     Pulses: Normal pulses.          Radial pulses are 2+ on the right side and 2+ on the left side.       Dorsalis pedis pulses are 2+ on the right side and 2+ on the left side.     Heart sounds: Normal heart sounds, S1 normal and S2 normal.  Pulmonary:     Effort: No respiratory distress.     Comments: Tolerated being on HFNC at 35L/60% during the day and BiPAP at night Abdominal:     Palpations: Abdomen is soft. There is no mass.     Tenderness: There is no abdominal tenderness.  Musculoskeletal:     Right lower leg: No edema.     Left lower leg: No edema.  Neurological:     Mental Status: He is disoriented and unresponsive.      Assessment/Plan: Mr. Elkhatib is a 74 yr old male with a PMHx of SSS s/p pacemaker, P-Afib, GI bleed, ICH, dHF, CKD stage IV, DM, and HTN.  Principal  Problem:   ICH (intracerebral hemorrhage) (HCC) Active Problems:   Intraventricular hemorrhage (HCC)   Encephalopathy   Goals of care, counseling/discussion   Palliative care by specialist   Chronic respiratory failure with hypoxia and hypercapnia (HCC)   Acute on chronic respiratory failure with hypoxia and hypercapnia (HCC)   Aspiration into airway   Acute respiratory failure (HCC)   Encephalopathy: Multifactorial due to respiratory failure, ICH, AKI. Repeat CT (7/2) showed no acute changes. 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 -Continue reaching out to family with Palliative to determine goals of care.  Acute Hypercarbic and Hypoxemic Respiratory Failure  Aspiration PNA - finished antibiotics  Was Extubated on 6/24. He has been off and on BiPAP over the last few days. He tolerated being on HFNC today 35L/60%. Pulmonology has recommended against re-intubation. -Continue to monitor oxygen and plan to restart BiPAP at night and use canula during the day.  AKI on CKD Stage IV: Per Nephrology consult during admission dialysis is not recommended. -Continue 200cc free water q4 -Continue  torsemide 20 mg daily  Hypertension: Continue amlodipine, carvedilol, and hydralazine.   T2DM: Continue checking glucose levels and using novoLOG sliding scale  Prior to Admission Living Arrangement: Anticipated Discharge Location: Barriers to Discharge: Dispo: Anticipated discharge pending ongoing goals of care conversation with family.   Briant Cedar, MD 02/17/2020, 2:49 PM Pager: [phone] After 5pm on weekdays and 1pm on weekends: On Call pager [phone]

## 2020-02-17 NOTE — Progress Notes (Signed)
Attempted to reach patients daughter Zoe Nordin.  Was unable to and left voice mail that I would reach out again tomorrow.

## 2020-02-17 NOTE — Progress Notes (Signed)
Paged by nurse that patient was removing his nasal canula and pulling at his CoreTrak and trying to remove it.  When seen this morning patient was very confused and unable to follow any commands. He was not responding but was moving his hands around his face during evaluation.  Due to his encephalopathy he is unable to follow any commands and is a danger to himself. Since he is not able to understand what we are telling him a Air cabin crew would not be appropriate. It is appropriate at this point to place soft restraints on him so that he does not pull out his canula, CoreTrak, or any lines currently placed.

## 2020-02-17 NOTE — Progress Notes (Signed)
Patient transported to [id number] without complications.

## 2020-02-18 ENCOUNTER — Inpatient Hospital Stay (HOSPITAL_COMMUNITY): Payer: Medicare (Managed Care)

## 2020-02-18 LAB — GLUCOSE, CAPILLARY
Glucose-Capillary: 107 mg/dL — ABNORMAL HIGH (ref 70–99)
Glucose-Capillary: 127 mg/dL — ABNORMAL HIGH (ref 70–99)
Glucose-Capillary: 62 mg/dL — ABNORMAL LOW (ref 70–99)
Glucose-Capillary: 74 mg/dL (ref 70–99)
Glucose-Capillary: 77 mg/dL (ref 70–99)
Glucose-Capillary: 86 mg/dL (ref 70–99)
Glucose-Capillary: 92 mg/dL (ref 70–99)

## 2020-02-18 LAB — BASIC METABOLIC PANEL
Anion gap: 10 (ref 5–15)
BUN: 102 mg/dL — ABNORMAL HIGH (ref 8–23)
CO2: 30 mmol/L (ref 22–32)
Calcium: 9.6 mg/dL (ref 8.9–10.3)
Chloride: 108 mmol/L (ref 98–111)
Creatinine, Ser: 2.05 mg/dL — ABNORMAL HIGH (ref 0.61–1.24)
GFR calc Af Amer: 37 mL/min — ABNORMAL LOW (ref >60)
GFR calc non Af Amer: 32 mL/min — ABNORMAL LOW (ref >60)
Glucose, Bld: 86 mg/dL (ref 70–99)
Potassium: 3.9 mmol/L (ref 3.5–5.1)
Sodium: 148 mmol/L — ABNORMAL HIGH (ref 135–145)

## 2020-02-18 MED ORDER — DEXTROSE 5 % IV SOLN: INTRAVENOUS | Status: DC

## 2020-02-18 MED ORDER — METOPROLOL TARTRATE 5 MG/5ML IV SOLN
5.0000 mg | Freq: Four times a day (QID) | INTRAVENOUS | Status: DC
  Administered 2020-02-18 – 2020-02-20 (×9): 5 mg via INTRAVENOUS
  Filled 2020-02-18 (×8): qty 5

## 2020-02-18 MED ORDER — FUROSEMIDE 10 MG/ML IJ SOLN
40.0000 mg | Freq: Once | INTRAMUSCULAR | Status: AC
  Administered 2020-02-18: 40 mg via INTRAVENOUS
  Filled 2020-02-18: qty 4

## 2020-02-18 MED ORDER — LABETALOL HCL 5 MG/ML IV SOLN: 5.0000 mg | INTRAVENOUS | Status: DC | PRN

## 2020-02-18 MED ORDER — DEXTROSE 50 % IV SOLN
12.5000 g | INTRAVENOUS | Status: AC
  Administered 2020-02-18: 12.5 g via INTRAVENOUS
  Filled 2020-02-18: qty 50

## 2020-02-18 NOTE — Progress Notes (Addendum)
Subjective: Patient interviewed at bedside. Patient still encephalopathic. Unable to follow any commands but would moan in response to his name.  He would move his arms some during the exam but without much purpose or direction.    Objective:  Vital signs in last 24 hours: Vitals:   02/18/20 0041 02/18/20 0421 02/18/20 0500 02/18/20 0510  BP: (!) 168/83   (!) 159/80  Pulse:      Resp:      Temp:  98.5 F (36.9 C)    TempSrc:  Oral    SpO2:      Weight:   (!) 139.4 kg   Height:       Physical Exam Vitals and nursing note reviewed.  Constitutional:      General: He is not in acute distress.    Appearance: He is not ill-appearing.  Cardiovascular:     Rate and Rhythm: Normal rate and regular rhythm.     Pulses: Normal pulses.          Radial pulses are 2+ on the right side and 2+ on the left side.       Dorsalis pedis pulses are 2+ on the right side and 2+ on the left side.     Heart sounds: Normal heart sounds, S1 normal and S2 normal. No murmur heard.   Pulmonary:     Effort: Pulmonary effort is normal. Tachypnea present. No respiratory distress.     Breath sounds: No wheezing.     Comments: HFNC 25L/40% Abdominal:     Palpations: Abdomen is soft. There is no mass.     Tenderness: There is no abdominal tenderness.  Neurological:     Mental Status: He is disoriented, confused and unresponsive.      Assessment/Plan:  Principal Problem:   ICH (intracerebral hemorrhage) (HCC) Active Problems:   Intraventricular hemorrhage (HCC)   Encephalopathy   Goals of care, counseling/discussion   Palliative care by specialist   Chronic respiratory failure with hypoxia and hypercapnia (HCC)   Acute on chronic respiratory failure with hypoxia and hypercapnia (HCC)   Aspiration into airway   Acute respiratory failure (HCC)  Goals of Care:  On-going, unfortunately we have been unable to reach family for the past two days. If we cannot get in touch with them tomorrow, we  may need to involve the ethics team. His encephalopathy is improving somewhat but overall prognosis remains very poor.   Encephalopathy: Multifactorial due to respiratory failure, ICH, AKI. Repeat CT (7/2) showed no acute changes. 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 Called daughter today but she did not answer. Left message saying we will call again tomorrow. Patient may have dislodge his CoreTrak, abdominal x-ray is pending. -Reach out to family to determine goals of care. -Hold coretrak tube feeds; change meds to IV for now. May need to be replaced by coretrak team tomorrow.   Acute Hypercarbic and Hypoxemic Respiratory Failure  Aspiration PNA - finished antibiotics  Was Extubated on 6/24. He has been off and on BiPAP over the last few days. He tolerated being on HFNC currently. Pulmonology has recommended against re-intubation. -Currently tolerating Nasal Cannula; BiPAP QHS -Appreciate PCCM assistance   AKI on CKD Stage IV: Per Nephrology consult during admission dialysis is not recommended. -Continue torsemide 20 mg daily --> change to IV lasix due to coretrak issues  Hypernatremia: In the setting of coretrak being dislodged, has been unable to get free water per tube.  Na this morning 148 started D5W 50cc/hr  continuous will recheck Na tomorrow and adjust as needed   Hypertension: Due to patient partially removing CoreTrak cannot give an medicine PO at the moment. -Will change to IV meds, hold all PO meds per tube  T2DM: Glucose levels have been well controlled continue using novoLOG sliding scale   Prior to Admission Living Arrangement: Anticipated Discharge Location: Barriers to Discharge: Dispo: Anticipated discharge pending ongoing goals of care conversation with family.   Briant Cedar, MD 02/18/2020, 6:54 AM Pager: [phone] After 5pm on weekdays and 1pm on weekends: On Call pager [phone]

## 2020-02-18 NOTE — Progress Notes (Signed)
Physical Therapy Treatment Patient Details Name: [name] MRN: [id number] DOB: 1946/02/21 Today's Date: 02/18/2020    History of Present Illness Pt is a 74 y.o. male with PMH of HTN, PAF, SSS, CHF, tachycardia, Heart failure, cerebral artery occlusion with cerebral infarction, OSA, chronic obstructive lung disease, pulmonary edema, DM, type II, CVA, DJD, stage 3 chronic kidney disease, AKI, Gout, depression, blindness, coarse tremors, CAD, L mid foot amputation, amd pacemaker. Pt sent to ED from outpatient imaging when pt had syncope episode, urinated on self, and became confused. CT reveals stable hemorrhage layering in each occipital horn of the lateral ventricles, stable. Pt intubated on 6/17 and extubated on 6/24.    PT Comments    Pt tolerates treatment well and is much more alert than previous sessions, however remains unable to follow any motor commands during session. Pt does respond to some verbal cues with "ok" but otherwise does not state anything else during session. Pt will benefit from continued acute PT services to attempt to see if pt can follow any motor commands, but if pt remains unable then PT will sign off.  Follow Up Recommendations  SNF;Supervision/Assistance - 24 hour     Equipment Recommendations  Hospital bed;Wheelchair (measurements PT);Wheelchair cushion (measurements PT)    Recommendations for Other Services       Precautions / Restrictions Precautions Precautions: Fall Precaution Comments: pt is Blind Restrictions Weight Bearing Restrictions: No    Mobility  Bed Mobility Overal bed mobility: Needs Assistance Bed Mobility: Rolling Rolling: Total assist;+2 for physical assistance         General bed mobility comments: pt also assisted into bed egress position with totalA to pull forward into long sitting  Transfers                    Ambulation/Gait                 Stairs             Wheelchair Mobility     Modified Rankin (Stroke Patients Only) Modified Rankin (Stroke Patients Only) Pre-Morbid Rankin Score: Moderately severe disability Modified Rankin: Severe disability     Balance Overall balance assessment: Needs assistance Sitting-balance support: Bilateral upper extremity supported Sitting balance-Leahy Scale: Zero Sitting balance - Comments: totalA x2 to pull forward into sitting, posterior preference Postural control: Posterior lean                                  Cognition Arousal/Alertness: Awake/alert Behavior During Therapy: Flat affect Overall Cognitive Status: Impaired/Different from baseline Area of Impairment: Attention;Following commands                   Current Attention Level: Focused   Following Commands:  (does not follow commands)              Exercises      General Comments General comments (skin integrity, edema, etc.): pt on 25L HFNC at this time, VSS during session      Pertinent Vitals/Pain Pain Assessment: Faces Faces Pain Scale: Hurts little more Pain Location: generalized Pain Descriptors / Indicators: Grimacing Pain Intervention(s): Monitored during session    Home Living                      Prior Function            PT Goals (current goals can now be  found in the care plan section) Acute Rehab PT Goals Patient Stated Goal: unable Time For Goal Achievement: 03/03/20 Potential to Achieve Goals: Poor Progress towards PT goals: Not progressing toward goals - comment    Frequency    Min 2X/week      PT Plan Current plan remains appropriate    Co-evaluation PT/OT/SLP Co-Evaluation/Treatment: Yes Reason for Co-Treatment: Complexity of the patient's impairments (multi-system involvement);Necessary to address cognition/behavior during functional activity;For patient/therapist safety          AM-PAC PT "6 Clicks" Mobility   Outcome Measure  Help needed turning from your back to your  side while in a flat bed without using bedrails?: Total Help needed moving from lying on your back to sitting on the side of a flat bed without using bedrails?: Total Help needed moving to and from a bed to a chair (including a wheelchair)?: Total Help needed standing up from a chair using your arms (e.g., wheelchair or bedside chair)?: Total Help needed to walk in hospital room?: Total Help needed climbing 3-5 steps with a railing? : Total 6 Click Score: 6    End of Session Equipment Utilized During Treatment: Oxygen Activity Tolerance: Patient tolerated treatment well (but unable to follow commands) Patient left: in bed;with call bell/phone within reach;with bed alarm set Nurse Communication: Mobility status;Need for lift equipment PT Visit Diagnosis: Other symptoms and signs involving the nervous system (C94.496)     Time: 7591-6384 PT Time Calculation (min) (ACUTE ONLY): 25 min  Charges:  $Therapeutic Activity: 8-22 mins                     Zenaida Niece, PT, DPT Acute Rehabilitation Pager: [phone]    Zenaida Niece 02/18/2020, 12:58 PM

## 2020-02-18 NOTE — Progress Notes (Signed)
Attempted to call patients daughter was unsuccessful. Left message stating will attempt to call again tomorrow.

## 2020-02-18 NOTE — Progress Notes (Signed)
Hypoglycemic Event  CBG: 62  Treatment: dextrose 12.33ml given via midline.  Symptoms: no mentation change. Fatigue  Follow-up CBG: Time:2131 CBG Result:107  Possible Reasons for Event: Patient was currently taken off of his tubefeeding due to dislodged cortrak. NPO. Was given day time insulin., Has D5 at 27ml/hr   Comments/MD notified:2151- Paged Family medicine,  2209- family medicine calls back. Ordered to hold Levimir for now. Made aware of the 6units every 4hours-held dose.     Otilio Jefferson

## 2020-02-18 NOTE — Progress Notes (Signed)
Attempted to call patients daughter to discuss goals of care and any decisions the family has made. Daughter did not answer phone. Message was left that we will attempt to call again tomorrow.

## 2020-02-18 NOTE — Progress Notes (Signed)
Occupational therapy Treatment Note  Pt seen as co-treat with PT on 25 L HFNC with VSS. When eyes open, pt looking toward R. Pt not following commands this session. Spontaneously moving [place] however not to command. When mittens removed to attempt to engage in ADL, pt pulling off [place].  Attempted to work with pt in chair/Egress position,however unable to follow commands to participate. Will continue to follow acutely.     02/18/20 1400  OT Visit Information  Last OT Received On 02/18/20  Assistance Needed +2  PT/OT/SLP Co-Evaluation/Treatment Yes  Reason for Co-Treatment Complexity of the patient's impairments (multi-system involvement);Necessary to address cognition/behavior during functional activity;For patient/therapist safety  OT goals addressed during session ADL's and self-care  History of Present Illness Pt is a 74 y.o. male with PMH of HTN, PAF, SSS, CHF, tachycardia, Heart failure, cerebral artery occlusion with cerebral infarction, OSA, chronic obstructive lung disease, pulmonary edema, DM, type II, CVA, DJD, stage 3 chronic kidney disease, AKI, Gout, depression, blindness, coarse tremors, CAD, L mid foot amputation, amd pacemaker. Pt sent to ED from outpatient imaging when pt had syncope episode, urinated on self, and became confused. CT reveals stable hemorrhage layering in each occipital horn of the lateral ventricles, stable. Pt intubated on 6/17 and extubated on 6/24.  Precautions  Precautions Fall  Precaution Comments pt is ; coretrack  Pain Assessment  Pain Assessment Faces  Faces Pain Scale 4  Pain Location generalized  Pain Descriptors / Indicators Grimacing  Pain Intervention(s) Limited activity within patient's tolerance  Cognition  Arousal/Alertness Lethargic  Behavior During Therapy Flat affect  Overall Cognitive Status Impaired/Different from baseline  Area of Impairment Attention;Following commands;Orientation;Memory;Safety/judgement;Awareness;Problem solving   Orientation Level Disoriented to;Place;Time;Situation  Current Attention Level Focused  Memory Decreased short-term memory  Following Commands Follows one step commands inconsistently (does not follow commands)  Safety/Judgement Decreased awareness of safety;Decreased awareness of deficits  Awareness Intellectual  Problem Solving Slow processing;Decreased initiation;Difficulty sequencing;Requires verbal cues;Requires tactile cues  Upper Extremity Assessment  Upper Extremity Assessment RUE deficits/detail;LUE deficits/detail  RUE Deficits / Details moving spontaneously; attempting to pull off [place]  ADL  Overall ADL's  Needs assistance/impaired  General ADL Comments total A at this time  Bed Mobility  Overal bed mobility Needs Assistance  Bed Mobility Rolling  Rolling Total assist;+2 for physical assistance  General bed mobility comments pt also assisted into bed egress position with totalA to pull forward into long sitting  Balance  Overall balance assessment Needs assistance  Sitting-balance support Bilateral upper extremity supported  Sitting balance-Leahy Scale Zero  Sitting balance - Comments totalA x2 to pull forward into sitting, posterior preference  Postural control Posterior lean  Vision- Assessment  Additional Comments impaired; R gaze preference when eyes open  Transfers  General transfer comment deferred due to lethargy  General Comments  General comments (skin integrity, edema, etc.) on 25 L HHFNC  OT - End of Session  Equipment Utilized During Treatment Oxygen (25L)  Activity Tolerance Patient limited by lethargy  Patient left in bed;with call bell/phone within reach;with bed alarm set  Nurse Communication Mobility status  OT Assessment/Plan  OT Plan Discharge plan remains appropriate  OT Visit Diagnosis Other symptoms and signs involving cognitive function;Other symptoms and signs involving the nervous system (R29.898);Low vision, both eyes (H54.2);Apraxia  (R48.2);Muscle weakness (generalized) (M62.81)  OT Frequency (ACUTE ONLY) Min 2X/week  Follow Up Recommendations SNF;Supervision/Assistance - 24 hour  OT Equipment None recommended by OT  AM-PAC OT "6 Clicks" Daily Activity Outcome Measure (Version  2)  Help from another person eating meals? 1  Help from another person taking care of personal grooming? 1  Help from another person toileting, which includes using toliet, bedpan, or urinal? 1  Help from another person bathing (including washing, rinsing, drying)? 1  Help from another person to put on and taking off regular upper body clothing? 1  Help from another person to put on and taking off regular lower body clothing? 1  6 Click Score 6  OT Goal Progression  Progress towards OT goals Not progressing toward goals - comment;OT to reassess next treatment  Acute Rehab OT Goals  Patient Stated Goal unable  OT Goal Formulation Patient unable to participate in goal setting  Time For Goal Achievement 02/21/20  Potential to Achieve Goals Fair  ADL Goals  Pt Will Perform Eating with max assist;bed level  Pt Will Perform Grooming with mod assist;bed level  Pt Will Perform Upper Body Dressing with max assist;bed level  Pt Will Transfer to Toilet with max assist;squat pivot transfer;bedside commode  Additional ADL Goal #1 Pt will follow 1 step BADL command with min VC's  Additional ADL Goal #2 Pt will complete bed mobility at max A +2 in preparation for OOB BADLs  OT Time Calculation  OT Start Time (ACUTE ONLY) 1149  OT Stop Time (ACUTE ONLY) 1215  OT Time Calculation (min) 26 min  OT General Charges  $OT Visit 1 Visit  OT Treatments  $Self Care/Home Management  8-22 mins  Maurie Boettcher, OT/L   Acute OT Clinical Specialist [place] Pager [phone] Office [phone]

## 2020-02-19 DIAGNOSIS — E8770 Fluid overload, unspecified: Secondary | ICD-10-CM

## 2020-02-19 LAB — CBC
HCT: 36.8 % — ABNORMAL LOW (ref 39.0–52.0)
Hemoglobin: 10.4 g/dL — ABNORMAL LOW (ref 13.0–17.0)
MCH: 24 pg — ABNORMAL LOW (ref 26.0–34.0)
MCHC: 28.3 g/dL — ABNORMAL LOW (ref 30.0–36.0)
MCV: 85 fL (ref 80.0–100.0)
Platelets: 154 10*3/uL (ref 150–400)
RBC: 4.33 MIL/uL (ref 4.22–5.81)
RDW: 18.8 % — ABNORMAL HIGH (ref 11.5–15.5)
WBC: 10.5 10*3/uL (ref 4.0–10.5)
nRBC: 0 % (ref 0.0–0.2)

## 2020-02-19 LAB — BASIC METABOLIC PANEL
Anion gap: 14 (ref 5–15)
Anion gap: 11 (ref 5–15)
Anion gap: 11 (ref 5–15)
BUN: 85 mg/dL — ABNORMAL HIGH (ref 8–23)
BUN: 80 mg/dL — ABNORMAL HIGH (ref 8–23)
BUN: 74 mg/dL — ABNORMAL HIGH (ref 8–23)
CO2: 29 mmol/L (ref 22–32)
CO2: 27 mmol/L (ref 22–32)
CO2: 27 mmol/L (ref 22–32)
Calcium: 9.9 mg/dL (ref 8.9–10.3)
Calcium: 9.6 mg/dL (ref 8.9–10.3)
Calcium: 9.7 mg/dL (ref 8.9–10.3)
Chloride: 109 mmol/L (ref 98–111)
Chloride: 112 mmol/L — ABNORMAL HIGH (ref 98–111)
Chloride: 111 mmol/L (ref 98–111)
Creatinine, Ser: 2.07 mg/dL — ABNORMAL HIGH (ref 0.61–1.24)
Creatinine, Ser: 1.91 mg/dL — ABNORMAL HIGH (ref 0.61–1.24)
Creatinine, Ser: 1.92 mg/dL — ABNORMAL HIGH (ref 0.61–1.24)
GFR calc Af Amer: 36 mL/min — ABNORMAL LOW (ref >60)
GFR calc Af Amer: 40 mL/min — ABNORMAL LOW (ref >60)
GFR calc Af Amer: 40 mL/min — ABNORMAL LOW (ref >60)
GFR calc non Af Amer: 31 mL/min — ABNORMAL LOW (ref >60)
GFR calc non Af Amer: 34 mL/min — ABNORMAL LOW (ref >60)
GFR calc non Af Amer: 34 mL/min — ABNORMAL LOW (ref >60)
Glucose, Bld: 121 mg/dL — ABNORMAL HIGH (ref 70–99)
Glucose, Bld: 236 mg/dL — ABNORMAL HIGH (ref 70–99)
Glucose, Bld: 256 mg/dL — ABNORMAL HIGH (ref 70–99)
Potassium: 3.7 mmol/L (ref 3.5–5.1)
Potassium: 3.6 mmol/L (ref 3.5–5.1)
Potassium: 3.5 mmol/L (ref 3.5–5.1)
Sodium: 152 mmol/L — ABNORMAL HIGH (ref 135–145)
Sodium: 150 mmol/L — ABNORMAL HIGH (ref 135–145)
Sodium: 149 mmol/L — ABNORMAL HIGH (ref 135–145)

## 2020-02-19 LAB — GLUCOSE, CAPILLARY
Glucose-Capillary: 102 mg/dL — ABNORMAL HIGH (ref 70–99)
Glucose-Capillary: 123 mg/dL — ABNORMAL HIGH (ref 70–99)
Glucose-Capillary: 178 mg/dL — ABNORMAL HIGH (ref 70–99)
Glucose-Capillary: 216 mg/dL — ABNORMAL HIGH (ref 70–99)
Glucose-Capillary: 244 mg/dL — ABNORMAL HIGH (ref 70–99)
Glucose-Capillary: 75 mg/dL (ref 70–99)

## 2020-02-19 MED ORDER — FUROSEMIDE 10 MG/ML IJ SOLN
40.0000 mg | Freq: Once | INTRAMUSCULAR | Status: AC
  Administered 2020-02-19: 40 mg via INTRAVENOUS
  Filled 2020-02-19: qty 4

## 2020-02-19 MED ORDER — DEXTROSE 10 % IV SOLN: INTRAVENOUS | Status: DC

## 2020-02-19 NOTE — Progress Notes (Signed)
Attempted to reach patients daughter about families decisions of goals of care. Was unable to reach so left a message emphasizing the important of reaching a decision and that we would attempt to call back later today as we are approaching a point where a decision does need to be made about the patients care.

## 2020-02-19 NOTE — Progress Notes (Signed)
Cortrak removed and mIVF increased to 150 per MD order

## 2020-02-19 NOTE — Progress Notes (Signed)
Spoke with patients daughter Isiaha Greenup, who is the [place], at bedside. Discussed that at this point we needed clarification about what the next steps in care would be.   We needed confirmation that the patient was still full code which she responded yes.  We asked about placing a new CoreTrak since the patient dislodged the previous one. She asked if it was possible the patient would dislodge this one. We told her that due to her fathers continued encephalopathy, he cannot follow any commands. Therefore,  there was a high chance he would pull it out again as he has been continually removing his nasal canula. She stated that she did not want a new placed at this time and would discuss with her siblings further.  We were asked what were the options before her that she needed to talk with her siblings about. We discussed that one option was having a PEG tube placed and then once electrolytes are normalized discharge to an LTACH. The second option would be to switch him to [place].   Told her we will be consulting neurology for more insight into patients status and long term outcomes.  We told her that we would be calling Palliative Care to ensure that they are part of the conversation.  We asked if she had any questions and she stated she did not have any at the moment and that she would discuss everything with her siblings.  We discussed the importance of coming to a discission sooner rather than later.

## 2020-02-19 NOTE — Progress Notes (Addendum)
Paged internal medicine about midnight blood sugar 75. No night insulins were given. Continues to have infusion dextrose 5% infusing at 93ml/hr.NO mentation changed noted.   4799- Internal med returned call. New orders noted. Will hold any insulin that is ordered until day team is spoken with. Will past to oncoming nurse to hold off on levemir insulin AM dose until team is notified of his blood sugars.  Meanwhile dextrose 10 will be infusing at 36ml/hr.

## 2020-02-19 NOTE — Evaluation (Signed)
Clinical/Bedside Swallow Evaluation Patient Details  Name: [name] MRN: [id number] Date of Birth: [birth year]  Today's Date: 02/19/2020 Time: SLP Start Time (ACUTE ONLY): 1251-11-28 SLP Stop Time (ACUTE ONLY): 1306 SLP Time Calculation (min) (ACUTE ONLY): 13 min  Past Medical History:  Past Medical History:  Diagnosis Date  . Arthritis    "all over" (07-15-2013)  . Atrial fibrillation ([place])    not Candidate for Coumadin d/t rectal bleeding  . Benign prostatic hypertrophy   . Cardiomyopathy, hypertensive ([place])     diastolic dysfunction by 2297 echo  . Coronary artery disease     nonobstructive. 2 vessel. cardiac catheter in 28-Nov-2003 showing 60% LAD occlusion, 30% circumflex occlusion.  . Degenerative joint disease     Right knee.  . Diabetes type 2, uncontrolled ([place])   . Diabetic peripheral neuropathy ([place])   . Diabetic ulcer of thigh ([place])    H/O Rt thigh ulcer.  . Exertional shortness of breath    "comes and goes" (2013/07/15)  . GERD (gastroesophageal reflux disease)   . Gout   . Herpes   . Hyperlipidemia   . Hypertension   . Hypotension 09/05/2013  . Incarcerated ventral hernia    H/O. S/P repair ventral hernia repair 07/2005.  Marland Kitchen LBBB (left bundle branch block)   . Legally blind   . Morbid obesity ([place])   . Obstructive sleep apnea    On CPAP. Last sleep study (06/2009) - Severe obstructive sleep apnea/hypopnea syndrome, apnea-hypopnea index 70.5 per hour with non positional events moderately loud snoring and oxygen desaturation to a nadir of 85% on room air.  . Occult blood positive stool     History of in August 2007.  colonoscopy in January 2008 showing normal colon with fair inadequate prep. By Dr. Silvano Rusk.  . Pacemaker 11-28-03    secondary to bradycardia  . Sepsis ([place]) 08/26/2013   Past Surgical History:  Past Surgical History:  Procedure Laterality Date  . AMPUTATION Left 06/22/2013   Procedure: AMPUTATION FOOT;  Surgeon: Newt Minion, MD;  Location: [facility];  Service: Orthopedics;  Laterality: Left;  Left Midfoot Amputation  . CARDIAC CATHETERIZATION  10/22/2003   Dilatation of the LV with preserved LV systolic function. Tortous aorta, but no AAA. Moderate disease with 50-60% area of narrowing in the circumflex after the second OM and 70% mid narrowing in the LAD. No intervention.  Marland Kitchen CARDIOVASCULAR STRESS TEST  01/23/2010   Perfusion defect seen in inferior myocardial region is consistent with diaphragmatic attenuation. Remaining myocardium demonstrates normal myocardial perfusion with no evidence of ischemia or infarct. Stress ECG was non-diagnostic due to LBBB.  Marland Kitchen CATARACT EXTRACTION, BILATERAL Bilateral   . ESOPHAGOGASTRODUODENOSCOPY N/A 10/12/2013   Procedure: ESOPHAGOGASTRODUODENOSCOPY (EGD);  Surgeon: Gwenyth Ober, MD;  Location: [facility];  Service: General;  Laterality: N/A;  bedside  . EYE SURGERY    . 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 HERNIA REPAIR    . INSERT / REPLACE / REMOVE PACEMAKER  11/28/2003   Secondary to symptomatic bradycardia. DDDR pacemaker. By Dr. Rollene Fare.  Randolm Idol / REPLACE / REMOVE PACEMAKER  Nov 27, 2012   "old pacemaker died; had new one put in" (2013/07/15)  . Pacemaker lead revision  12/2003    likely microdislodgment  of right ventricular lead  . PEG PLACEMENT N/A 10/12/2013   Procedure: PERCUTANEOUS ENDOSCOPIC GASTROSTOMY (PEG) PLACEMENT;  Surgeon: Gwenyth Ober, MD;  Location: [facility];  Service: General;  Laterality: N/A;  .  PERMANENT PACEMAKER GENERATOR CHANGE N/A 11/03/2012   Procedure: PERMANENT PACEMAKER GENERATOR CHANGE;  Surgeon: Sanda Klein, MD;  Location: [facility] CATH LAB;  Service: Cardiovascular;  Laterality: N/A;  . REFRACTIVE SURGERY Bilateral   . RIGHT HEART CATH N/A 09/05/2018   Procedure: RIGHT HEART CATH;  Surgeon: Larey Dresser, MD;  Location: [facility] CV LAB;  Service: Cardiovascular;  Laterality: N/A;  . RIGHT/LEFT HEART CATH AND CORONARY  ANGIOGRAPHY N/A 04/10/2019   Procedure: RIGHT/LEFT HEART CATH AND CORONARY ANGIOGRAPHY;  Surgeon: [name], MD;  Location: [facility] CV LAB;  Service: Cardiovascular;  Laterality: N/A;  . TEE WITHOUT CARDIOVERSION N/A 09/05/2018   Procedure: TRANSESOPHAGEAL ECHOCARDIOGRAM (TEE);  Surgeon: Larey Dresser, MD;  Location[facility] ENDOSCOPY;  Service: Cardiovascular;  Laterality: N/A;  . TEE WITHOUT CARDIOVERSION N/A 05/08/2019   Procedure: TRANSESOPHAGEAL ECHOCARDIOGRAM (TEE);  Surgeon: [name], MD;  Location: [facility] CV LAB;  Service: Open Heart Surgery;  Laterality: N/A;  . TRANSCATHETER AORTIC VALVE REPLACEMENT, TRANSFEMORAL N/A 05/08/2019   Procedure: TRANSCATHETER AORTIC VALVE REPLACEMENT, TRANSFEMORAL;  Surgeon: [name], MD;  Location: [facility] CV LAB;  Service: Open Heart Surgery;  Laterality: N/A;  . TRANSTHORACIC ECHOCARDIOGRAM  01/01/2013   EF 50-55%, ventricular septum-thickness moderately increased  . VENOUS DOPPLER  01/17/2013   No evidence of thrombus or thrombophlebitis. Left GSVs appear enlarged and demonstrate valvular insuffiiciency. Bilateral SSVs appeared patent with no valvular insufficiency seen.  Marland Kitchen VENTRAL HERNIA REPAIR  07/2005    S/P laparotomy, lysis of adhesions, repair of incarcerated ventral hernia with  partial omentectomy.. Performed by Dr. Marlou Starks.   HPI:  Pt is a 74 year old with prior intracranial hemorrhage and blindness who presented with 3 days of headaches Head CT increased but still small volume intraventricular hemorrhage layering in the occipital horns of the lateral ventricles." Unable to perform MRI. BSE performed 10/04/19 recommended NPO, 01/27/20 NPO and unable to initiate po's due to respiratory status with decline and intubated 6/17-6/24, BiPAP 6/25 and discontinued during the day. Cortak disloged 7/5 and pulled 7/6. Per MD notes "pt's encephalopathy is improving somewhat but overall prognosis remains very poor".   Assessment / Plan /  Recommendation Clinical Impression  Pt was unable to sustain attention or acknowledge SLP's presence during swallow assessment and spontaneously uttered "oh boy" x 2. Bilateral oral sulci and partial lingual oral hygiene completed despite pt turning head away from therapist with all attempts. He would not readily accept ice chip via spoon or water via teaspoon or one cup sip and therapist. Attempted to manipulate ice chip for several seconds an stoped. Initiated a delayed swallow when water placed in oral cavity however this is non functional and he is not a candidate for po's at this time. ST will work with pt on a trial basis for improvements and potential. Continue oral care.      SLP Visit Diagnosis: Dysphagia, unspecified (R13.10)    Aspiration Risk  Moderate aspiration risk;Severe aspiration risk    Diet Recommendation NPO   Medication Administration: Via alternative means    Other  Recommendations Oral Care Recommendations: Oral care QID   Follow up Recommendations Skilled Nursing facility      Frequency and Duration min 1 x/week  2 weeks       Prognosis Prognosis for Safe Diet Advancement:  (fair-guarded) Barriers to Reach Goals: Cognitive deficits      Swallow Study   General Date of Onset: 01/25/20 HPI: Pt is a 74 year old with prior intracranial hemorrhage and blindness who  presented with 3 days of headaches Head CT increased but still small volume intraventricular hemorrhage layering in the occipital horns of the lateral ventricles." Unable to perform MRI. BSE performed 10/04/19 recommended NPO, 01/27/20 NPO and unable to initiate po's due to respiratory status with decline and intubated 6/17-6/24, BiPAP 6/25 and discontinued during the day. Cortak disloged 7/5 and pulled 7/6. Per MD notes "pt's encephalopathy is improving somewhat but overall prognosis remains very poor". Type of Study: Bedside Swallow Evaluation Previous Swallow Assessment:  (see HPI) Diet Prior to this  Study: NPO (Cortrack dislodged and removed 02/19/20) Temperature Spikes Noted: No Respiratory Status:  (HFNC) History of Recent Intubation: Yes Length of Intubations (days): 8 days Date extubated: 02/07/20 Behavior/Cognition: Uncooperative;Requires cueing;Doesn't follow directions;Confused Oral Cavity Assessment: Dry;Dried secretions Oral Care Completed by SLP: Yes Oral Cavity - Dentition: Other (Comment) (has natural dentition, uncertain if missing any) Vision:  (pt blind) Self-Feeding Abilities: Total assist Patient Positioning: Upright in bed Baseline Vocal Quality: Normal (said "oh boy x 2") Volitional Cough: Cognitively unable to elicit Volitional Swallow: Unable to elicit    Oral/Motor/Sensory Function Overall Oral Motor/Sensory Function: Other (comment) (not follow commands, resistance to to[place] appears strong)   Ice Chips Ice chips: Impaired Presentation: Spoon Oral Phase Impairments: Reduced labial seal;Reduced lingual movement/coordination;Poor awareness of bolus Oral Phase Functional Implications: Prolonged oral transit (?uncertain if initiated swallow with ice)   Thin Liquid Thin Liquid: Impaired Presentation: Spoon;Cup Oral Phase Impairments: Reduced labial seal;Reduced lingual movement/coordination;Poor awareness of bolus Oral Phase Functional Implications: Right anterior spillage;Left anterior spillage Pharyngeal  Phase Impairments: Suspected delayed Swallow    Nectar Thick Nectar Thick Liquid: Not tested   Honey Thick Honey Thick Liquid: Not tested   Puree Puree: Not tested   Solid     Solid: Not tested      Houston Siren 02/19/2020,1:35 PM Orbie Pyo Colvin Caroli.Ed Risk analyst [phone] Office [phone]

## 2020-02-19 NOTE — Progress Notes (Addendum)
Subjective: Patient seen at bedside. Continues to be encephalopathic Would respond with "yes" when his name was called out. When asked to move his arm he did move his left arm up. Did not respond when asked if he had any pain.  Objective:  Vital signs in last 24 hours: Vitals:   02/18/20 2301 02/19/20 0000 02/19/20 0313 02/19/20 0345  BP:  (!) 157/79  (!) 153/83  Pulse: 79 77 78 78  Resp: (!) 24  20   Temp:  98.7 F (37.1 C)  98.6 F (37 C)  TempSrc:  Axillary  Axillary  SpO2: 100% 100% 99% 99%  Weight:      Height:       Physical Exam Vitals and nursing note reviewed.  Constitutional:      General: He is not in acute distress.    Appearance: He is obese. He is not ill-appearing or toxic-appearing.  HENT:     Head: Normocephalic and atraumatic.  Cardiovascular:     Rate and Rhythm: Normal rate and regular rhythm.     Pulses:          Radial pulses are 2+ on the right side and 2+ on the left side.       Dorsalis pedis pulses are 2+ on the right side and 2+ on the left side.     Heart sounds: Normal heart sounds, S1 normal and S2 normal.  Pulmonary:     Effort: Tachypnea present. No respiratory distress.  Abdominal:     Palpations: There is no mass.     Tenderness: There is no abdominal tenderness.  Neurological:     Mental Status: He is lethargic, disoriented and confused.      Assessment/Plan:  Principal Problem:   ICH (intracerebral hemorrhage) (HCC) Active Problems:   Intraventricular hemorrhage (HCC)   Encephalopathy   Goals of care, counseling/discussion   Palliative care by specialist   Chronic respiratory failure with hypoxia and hypercapnia (HCC)   Acute on chronic respiratory failure with hypoxia and hypercapnia (HCC)   Aspiration into airway   Acute respiratory failure (HCC)  Goals of Care:  On-going, unfortunately we have been unable to reach family for the past few days. Will attempt to contact family again today if unable to reach them we may  need to involve the ethics team. His encephalopathy is improving somewhat but overall prognosis remains very poor.    Encephalopathy: Multifactorial due to respiratory failure, ICH, AKI. Repeat CT (7/2) showed no acute changes. 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 Called daughter today but she did not answer. Xray confirmed that patient dislodged his CoreTrak yesterday will have nursing remove today. Mental status is actually improved today. Was able to respond to name and move one arm when asked to do so. -Continue attempts to contact family -Will ask SLP to evaluate today; may need to consider PEG tube placement given slow to resolve encephalopathy. Unclear what his new baseline will be. -Place CoreTrak today   Acute Hypercarbic and Hypoxemic Respiratory Failure  Aspiration PNA - finished antibiotics  Was Extubated on 6/24. He has been off and on BiPAP over the last few days. He tolerated being on HFNC currently. Pulmonology has recommended against re-intubation. Stable O2 sats will continue to monitor. -Currently tolerating Nasal Cannula; BiPAP QHS -Appreciate PCCM assistance   AKI on CKD Stage IV: Per Nephrology consult during admission dialysis is not recommended. -Continue torsemide 20 mg daily --> holding for now without coretrak; IV lasix x 1  instead    Hypernatremia: In the setting of coretrak being dislodged, has been unable to get free water per tube. Started IV free water replacement yesterday Na this morning 152 increased D10W to 150cc/hr continuous will recheck Na at 1PM and reassess.    Hypertension: Due to CoreTrak being removed cannot give an medicine PO at the moment. -Changed to IV meds yesterday, pressure stable so far will continue to monitor   T2DM: Glucose levels have been too low over night. Night team switched D5W maintenance fluid to D10W maintenance fluid. Nursing held insulin until recheck Glucose levels have returned to stable. -Will continue with D10W  for maintenance -Continue sliding scale for glucose control    Prior to Admission Living Arrangement: Home Anticipated Discharge Location: Possibly to SNF depending on family Barriers to Discharge: Encephalopathy and goals of care discussion with family Dispo: Anticipated discharge pending ongoing goals of care conversation with family.   Briant Cedar, MD 02/19/2020, 6:22 AM Pager: [phone] After 5pm on weekdays and 1pm on weekends: On Call pager [phone]

## 2020-02-20 LAB — BASIC METABOLIC PANEL
Anion gap: 10 (ref 5–15)
Anion gap: 12 (ref 5–15)
Anion gap: 9 (ref 5–15)
BUN: 71 mg/dL — ABNORMAL HIGH (ref 8–23)
BUN: 69 mg/dL — ABNORMAL HIGH (ref 8–23)
BUN: 66 mg/dL — ABNORMAL HIGH (ref 8–23)
CO2: 28 mmol/L (ref 22–32)
CO2: 27 mmol/L (ref 22–32)
CO2: 28 mmol/L (ref 22–32)
Calcium: 9.6 mg/dL (ref 8.9–10.3)
Calcium: 9.6 mg/dL (ref 8.9–10.3)
Calcium: 9.4 mg/dL (ref 8.9–10.3)
Chloride: 108 mmol/L (ref 98–111)
Chloride: 105 mmol/L (ref 98–111)
Chloride: 109 mmol/L (ref 98–111)
Creatinine, Ser: 2.01 mg/dL — ABNORMAL HIGH (ref 0.61–1.24)
Creatinine, Ser: 1.99 mg/dL — ABNORMAL HIGH (ref 0.61–1.24)
Creatinine, Ser: 1.9 mg/dL — ABNORMAL HIGH (ref 0.61–1.24)
GFR calc Af Amer: 38 mL/min — ABNORMAL LOW (ref >60)
GFR calc Af Amer: 38 mL/min — ABNORMAL LOW (ref >60)
GFR calc Af Amer: 40 mL/min — ABNORMAL LOW (ref >60)
GFR calc non Af Amer: 32 mL/min — ABNORMAL LOW (ref >60)
GFR calc non Af Amer: 33 mL/min — ABNORMAL LOW (ref >60)
GFR calc non Af Amer: 35 mL/min — ABNORMAL LOW (ref >60)
Glucose, Bld: 169 mg/dL — ABNORMAL HIGH (ref 70–99)
Glucose, Bld: 192 mg/dL — ABNORMAL HIGH (ref 70–99)
Glucose, Bld: 100 mg/dL — ABNORMAL HIGH (ref 70–99)
Potassium: 3.5 mmol/L (ref 3.5–5.1)
Potassium: 3.4 mmol/L — ABNORMAL LOW (ref 3.5–5.1)
Potassium: 3.5 mmol/L (ref 3.5–5.1)
Sodium: 146 mmol/L — ABNORMAL HIGH (ref 135–145)
Sodium: 144 mmol/L (ref 135–145)
Sodium: 146 mmol/L — ABNORMAL HIGH (ref 135–145)

## 2020-02-20 LAB — GLUCOSE, CAPILLARY
Glucose-Capillary: 100 mg/dL — ABNORMAL HIGH (ref 70–99)
Glucose-Capillary: 117 mg/dL — ABNORMAL HIGH (ref 70–99)
Glucose-Capillary: 126 mg/dL — ABNORMAL HIGH (ref 70–99)
Glucose-Capillary: 137 mg/dL — ABNORMAL HIGH (ref 70–99)
Glucose-Capillary: 169 mg/dL — ABNORMAL HIGH (ref 70–99)
Glucose-Capillary: 191 mg/dL — ABNORMAL HIGH (ref 70–99)

## 2020-02-20 MED ORDER — HYDRALAZINE HCL 50 MG PO TABS
50.0000 mg | ORAL_TABLET | Freq: Four times a day (QID) | ORAL | Status: DC
  Administered 2020-02-20 – 2020-03-10 (×70): 50 mg
  Filled 2020-02-20 (×71): qty 1

## 2020-02-20 MED ORDER — AMLODIPINE BESYLATE 10 MG PO TABS
10.0000 mg | ORAL_TABLET | Freq: Every day | ORAL | Status: DC
  Administered 2020-02-20 – 2020-03-10 (×19): 10 mg
  Filled 2020-02-20 (×19): qty 1

## 2020-02-20 MED ORDER — CARVEDILOL 12.5 MG PO TABS
12.5000 mg | ORAL_TABLET | Freq: Two times a day (BID) | ORAL | Status: DC
  Administered 2020-02-20 – 2020-03-10 (×36): 12.5 mg
  Filled 2020-02-20 (×36): qty 1

## 2020-02-20 MED ORDER — FREE WATER
200.0000 mL | Status: DC
  Administered 2020-02-20 – 2020-02-28 (×48): 200 mL

## 2020-02-20 MED ORDER — TORSEMIDE 20 MG PO TABS
20.0000 mg | ORAL_TABLET | Freq: Every day | ORAL | Status: DC
  Administered 2020-02-20: 20 mg
  Filled 2020-02-20: qty 1

## 2020-02-20 NOTE — Procedures (Signed)
Cortrak  Person Inserting Tube:  [name], RD Tube Type:  Cortrak - 43 inches Tube Location:  Left nare Initial Placement:  Stomach Secured by: Bridle Technique Used to Measure Tube Placement:  Documented cm marking at nare/ corner of mouth Cortrak Secured At:  71 cm   No x-ray is required. RN may begin using tube.   If the tube becomes dislodged please keep the tube and contact the Cortrak team at [email or web address] (password [email or web address]) for replacement.  If after hours and replacement cannot be delayed, place a NG tube and confirm placement with an abdominal x-ray.    [name] RD, LDN Clinical Nutrition Pager listed in [place]

## 2020-02-20 NOTE — Progress Notes (Signed)
STROKE TEAM PROGRESS NOTE   INTERVAL HISTORY Stroke team was asked to come back to evaluate patient and comment on prognosis.  No family member at the bedside.  I called and left a message for patient's daughter to call me.  Patient is alert but not interactive not following commands or speaking.  Is unable to participate with speech therapy and passed swallow eval.  He had a feeding tube but he pulled it out. CT head 02/15/2020 shows trace residual intraventricular and subarachnoid hemorrhage significantly reduced from a month ago.  Changes of small vessel disease.  No acute abnormality.  OBJECTIVE Vitals:   02/20/20 0802 02/20/20 0814 02/20/20 0816 02/20/20 1114  BP: (!) 147/74 (!) 147/74  (!) 154/77  Pulse: 70 72 76 68  Resp: 20 (!) 28 (!) 23 (!) 21  Temp:    98.7 F (37.1 C)  TempSrc:    Oral  SpO2: 100% 100% 100% 100%  Weight:      Height:        CBC:  Recent Labs  Lab 02/15/20 0650 02/15/20 0832 02/16/20 0637 02/19/20 1056  WBC 11.0*   < > 10.6* 10.5  NEUTROABS 8.3*  --   --   --   HGB 9.6*   < > 9.3* 10.4*  HCT 31.7*   < > 31.9* 36.8*  MCV 82.6   < > 83.9 85.0  PLT 123*   < > 120* 154   < > = values in this interval not displayed.    Basic Metabolic Panel:  Recent Labs  Lab 02/20/20 0248 02/20/20 1010  NA 146* 144  K 3.5 3.4*  CL 108 105  CO2 28 27  GLUCOSE 169* 192*  BUN 71* 69*  CREATININE 2.01* 1.99*  CALCIUM 9.6 9.6    Lipid Panel:     Component Value Date/Time   CHOL 103 01/27/2020 0317   CHOL 129 01/05/2017 1235   TRIG 104 02/02/2020 0614   HDL 31 (L) 01/27/2020 0317   HDL 36 (L) 01/05/2017 1235   CHOLHDL 3.3 01/27/2020 0317   VLDL 14 01/27/2020 0317   LDLCALC 58 01/27/2020 0317   LDLCALC 71 01/05/2017 1235   HgbA1c:  Lab Results  Component Value Date   HGBA1C 8.9 (H) 01/26/2020   Urine Drug Screen:     Component Value Date/Time   LABOPIA NONE DETECTED 10/01/2013 1915   COCAINSCRNUR NONE DETECTED 10/01/2013 1915   COCAINSCRNUR NEG  12/16/2008 2246   LABBENZ NONE DETECTED 10/01/2013 1915   LABBENZ NEG 12/16/2008 2246   AMPHETMU NONE DETECTED 10/01/2013 1915   THCU NONE DETECTED 10/01/2013 1915   LABBARB NONE DETECTED 10/01/2013 1915    Alcohol Level     Component Value Date/Time   ETH <11 10/01/2013 1504    IMAGING  CT HEAD WO CONTRAST 01/26/2020 IMPRESSION:  Unchanged intraventricular blood layering within the occipital horns of the lateral ventricles.   CT Head Wo Contrast 01/25/2020 IMPRESSION:  There is hemorrhage in the dependent portion of each lateral ventricle of uncertain etiology. No brain parenchymal hemorrhage elsewhere noted. No mass. No extra-axial fluid. There is mild atrophy with extensive periventricular small vessel disease. No acute appearing infarct is evident. There are multiple foci of arterial vascular calcification. There is mucosal thickening in several ethmoid air cells.   DG Chest Portable 1 View 01/25/2020 IMPRESSION:  No active disease. Chronic cardiomegaly and pulmonary vascular congestion.   US Abdomen Limited RUQ 01/25/2020 IMPRESSION:  Gallbladder sludge and gallstones without complicating factors.  Right renal cysts stable from prior CT examination.    ECG - paced - rate 69 BPM.    PHYSICAL EXAM  Temp:  [97.4 F (36.3 C)-98.7 F (37.1 C)] 98.7 F (37.1 C) (07/07 1114) Pulse Rate:  [63-76] 68 (07/07 1114) Resp:  [18-29] 21 (07/07 1114) BP: (144-154)/(74-95) 154/77 (07/07 1114) SpO2:  [95 %-100 %] 100 % (07/07 1114) FiO2 (%):  [30 %-40 %] 40 % (07/07 0802)  General -obese elderly African-American male not in distress  Ophthalmologic - fundi not visualized due to noncooperation.  Cardiovascular - Regular rhythm and rate.  Neuro -he is awake alert but globally aphasic and not following commands or interacting.  He does not blink to threat on either side.  Pupils not identified bilaterally.  Fundi not visualized.  Only moaning on pain   but not following commands.  Bilateral eyes s/p surgery,   Eye mid position, slow doll's eyes. Positive gag cough and corneal. No clear facial droop. Tongue protrusion not cooperative. Not blinking to visual threat with eyes opening.  Has spontaneous bilateral upper extremity movements against gravity with pain stimulation, withdraw to BLEs and purposeful movement BLEs. DTR 1+ and no babinski. Sensation, coordination and gait not tested.    ASSESSMENT/PLAN Mr. Jaedon Siler is a 74 y.o. male with history of atrial fibrillation not on anticoagulation due to history of rectal bleeding (on ASA), cardiomyopathy, PPM, coronary disease, hypertension, legally blind, diabetes type 2, hypertension, hyperlipidemia and morbid obesity  presenting with nausea, vomiting, headache, recent fall, recent syncope, and AMS. He did not receive IV t-PA due to [place].  [name] - bilateral lateral ventricle small [name], etiology unclear, could be traumatic due to fall vs. spontaneous  CT Head - 01/25/20 - There is hemorrhage in the dependent portion of each lateral ventricle of uncertain etiology.   CT head - 01/26/20 - Unchanged intraventricular blood layering within the occipital horns of the lateral ventricles.   CT head 01/27/20 - slight increase of [name], borderline increase in ventriculomegaly  CT head repeat 01/28/20 - unchanged  MRI head - not able to do due to incompatible PPM   Carotid Doppler - unremarkable  2D Echo - EF 60-65%  [employer] Virus 2 - negative  LDL - 58  HgbA1c - 8.9  VTE prophylaxis - SCDs  aspirin 81 mg daily prior to admission, now on No antithrombotic due to [name]  Ongoing aggressive stroke risk factor management  Therapy recommendations: SNF   disposition:  Pending  Metabolic encephalopathy  Pt sleepy drowsy out of proportion for brain pathology - however, it could be the case if pt had baseline cognitive impairment   Etiology unclear  Not on sedation  Elevated LFT - AST/ALT 52/119 ->33/74  Elevated  ammonia - 42->36  CKD IV with elevated BUN 75->77->72 and Cre 2.37->2.66->2.39  EEG pending to rule out seizure  Management per primary team  Hypertension  Home BP meds: amlodipine ; apresoline ; metoprolol ; Demadex ; Zaroxolyn   Current BP meds: amlodipine ; hydralazine ; Demadex; metoprolol -> off due to no po access -> plan for cortrak and the restart po meds  Stable at 140-150s . SBP goal < 140 mm Hg . Long-term BP goal normotensive  Hyperlipidemia  Home Lipid lowering medication: Crestor 20 mg daily  LDL 58, goal < 70  Hold statin for now due to elevated LFT and [name]  Consider to resume statin at discharge  Diabetes  Home diabetic meds: insulin  Current diabetic meds:  lantus  HgbA1c 8.9, goal < 7.0  Hyperglycemia  SSI  CBG monitoring  Close PCP follow-up for better DM control  PAF  Not on AC due to [place] in 2015 (see below)  Not AC candidate now due to [name]  On ASA PTA  Hx of ICH  10/2013 - admitted for AMS found to have left temporoparietal ICH and edema. Concerning for hemorrhagic conversion but not able to have MRI done. Also had SOB due to PE from mobile mass (most likely clot vs unlikely vegetation) found on pacer wire in RA extending into RV. Not an anticoagulation candidate for treatment due to [place]. Placed on empiric abs for possible endocarditis/RA vegetation on wire - completed course ceftriaxone on 2/26. On aspirin 81 mg orally every day prior to admission.  Now on aspirin 81 mg orally every day.   Dysphagia  Not able to pass swallow due to altered mental status  Currently n.p.o.  Requested cortrak and TF  Consider to resume po meds after cortrak  Speech on board  Other Stroke Risk Factors  Advanced age  Former cigarette smoker - quit  ETOH use, advised to drink no more than 1 alcoholic beverage per day.  Obesity, Body mass index is 45.38 kg/m., recommend weight loss, diet and exercise as appropriate   Coronary artery  disease  Obstructive sleep apnea, on CPAP at home  Congestive Heart Failure s/p pacer  Other Active Problems  Code status - Full code  Legally blind - b/l eye surgery  PPM for bradycardia  Hospital day # 26 Patient had previous history of intracerebral hemorrhage likely residual aphasia and more recently intraventricular hemorrhage with significant cognitive impairment.  He has not made much neurological improvement over the last 1 month which is a poor prognostic sign and I doubt patient will recover to the point that he can be independent and manages own affairs.  I think a palliative care approach would be quite appropriate.  He will likely need a PEG tube for nutritional needs.  I tried calling patient's daughter but could not reach her and left message on answering machine. I do not believe further neurological testing is indicated at the present time.  Discussed with medical team intern on call.  Kindly call for questions.  Greater than 50% time during this 35-minute visit was spent on counseling and coordination of care and answering questions. Antony Contras, MD Stroke Neurology 02/20/2020 2:42 PM    To contact Stroke Continuity provider, please refer to [email or web address] After hours, contact General Neurology

## 2020-02-20 NOTE — Progress Notes (Signed)
Subjective: Examined patient at bedside.  Does respond to voice.  Was able to follow just one command (able to lift left arm up on command). Unable to follow any subsequent commands.  Objective:  Vital signs in last 24 hours: Vitals:   02/19/20 2019 02/19/20 2055 02/20/20 0009 02/20/20 0142  BP:   (!) 146/95   Pulse:   68   Resp:   20   Temp:   98.7 F (37.1 C)   TempSrc:   Axillary   SpO2: 95% 97% 99% 100%  Weight:      Height:       Physical Exam Vitals and nursing note reviewed.  Constitutional:      General: He is not in acute distress.    Appearance: He is obese. He is not toxic-appearing.  HENT:     Head: Normocephalic and atraumatic.  Cardiovascular:     Rate and Rhythm: Normal rate and regular rhythm.     Pulses:          Radial pulses are 2+ on the right side and 2+ on the left side.       Dorsalis pedis pulses are 2+ on the right side and 2+ on the left side.     Heart sounds: Normal heart sounds, S1 normal and S2 normal.  Pulmonary:     Effort: Tachypnea and accessory muscle usage present.  Abdominal:     Palpations: Abdomen is soft. There is no mass.     Tenderness: There is no abdominal tenderness.  Neurological:     Mental Status: He is lethargic, disoriented and confused.      Assessment/Plan:  Principal Problem:   ICH (intracerebral hemorrhage) (HCC) Active Problems:   Intraventricular hemorrhage (HCC)   Encephalopathy   Goals of care, counseling/discussion   Palliative care by specialist   Chronic respiratory failure with hypoxia and hypercapnia (HCC)   Acute on chronic respiratory failure with hypoxia and hypercapnia (HCC)   Aspiration into airway   Acute respiratory failure (HCC)   Hypervolemia  Goals of Care: On-going, was able to speak with daughter with health care power of attorney at bedside yesterday. Discussed that a decision would soon need to be made about goals of care. Daughter stated she would talk with siblings. If they  are slow to decide we may need to involve the ethics team. His encephalopathy is improving somewhat but overall prognosis remains very poor.Will attempt to call family again today.  Encephalopathy: Multifactorial due to respiratory failure, ICH, AKI. Repeat CT (7/2) showed no acute changes. Cannot get MRI due to implanted pacemaker.Called daughtertodaybut she did not answer. Xray confirmed that patient dislodged hisCoreTrak yesterday will have nursing remove today. Mental status has1improved some over last 3 days. Was able to respond to name and move one arm when asked to do so. Will attempt to call daughter today to get goals of care established. -Talked with daughter yesterday and emphasized needing to have family make decision and need to consider PEG tube placement given slow to resolve encephalopathy. -SLP evaluated yesterday and determined patient should be NPO secondary to his encephalopathy, and is rated as a moderate/severe risk for aspiration  -Unclear what his new baseline will be.  -Neuro has been consulted for assessment and insight on patients projected outcomes -Did not place CoreTrak yesterday per family   Acute Hypercarbic and Hypoxemic Respiratory Failure Aspiration PNA- finished antibiotics  Was Extubated on 6/24. He has been off and on BiPAP over the last few  days. He tolerates being on HFNCcurrently. Pulmonology has recommended against re-intubation. Stable O2 sats will continue to monitor. -patient occasionally removes canula but allows it to be placed back -Currentlytolerating Nasal Cannula 8L/min sat 100%; BiPAP QHS -Appreciate PCCM assistance  AKI on CKD Stage IV: Per Nephrology consult during admission dialysis is not recommended. BUN has improved  -Continue monitoring Kidney function -Monitor for volume overload especially due to increased hydration for Na correction    Hypernatremia:In the setting of coretrak being dislodged, has been unable to get  free water per tube. Started IV free water replacement yesterday Na this morning 140 night team increased D10W to 220cc/hr continuous will recheck Na at 2PM. -Continue to trend Na and adjust his fluids accordingly  Hypertension: Due to CoreTrak being removed cannot give an medicine PO at the moment. -Continue IV meds pressure have remained stable will continue to monitor  T2DM: Glucose levelshave been too low over night. Night team increased fluid to D10W 225cc/hr continuous. Since Na has decreased to 144 have decreased IV to 200 CC/hr  -Will continue with D10W for maintenance -Continue sliding scale for glucose control    Prior to Admission Living Arrangement: Home Anticipated Discharge Location: Possibly to [facility] depending on family Barriers to Discharge: Encephalopathy and goals of care discussion with family Dispo: Anticipated discharge pending ongoing goals of care conversation with family.   Briant Cedar, MD 02/20/2020, 6:15 AM Pager: [phone] After 5pm on weekdays and 1pm on weekends: On Call pager [phone]

## 2020-02-20 NOTE — Progress Notes (Signed)
RT NOTE: Patient placed on 8L salter [place] and tolerating well at this time. Vitals are stable and sats are 100%. RT will continue to monitor.

## 2020-02-21 LAB — BASIC METABOLIC PANEL
Anion gap: 11 (ref 5–15)
BUN: 63 mg/dL — ABNORMAL HIGH (ref 8–23)
CO2: 27 mmol/L (ref 22–32)
Calcium: 9.5 mg/dL (ref 8.9–10.3)
Chloride: 107 mmol/L (ref 98–111)
Creatinine, Ser: 1.91 mg/dL — ABNORMAL HIGH (ref 0.61–1.24)
GFR calc Af Amer: 40 mL/min — ABNORMAL LOW (ref >60)
GFR calc non Af Amer: 34 mL/min — ABNORMAL LOW (ref >60)
Glucose, Bld: 101 mg/dL — ABNORMAL HIGH (ref 70–99)
Potassium: 3.6 mmol/L (ref 3.5–5.1)
Sodium: 145 mmol/L (ref 135–145)

## 2020-02-21 LAB — GLUCOSE, CAPILLARY
Glucose-Capillary: 109 mg/dL — ABNORMAL HIGH (ref 70–99)
Glucose-Capillary: 104 mg/dL — ABNORMAL HIGH (ref 70–99)
Glucose-Capillary: 94 mg/dL (ref 70–99)
Glucose-Capillary: 144 mg/dL — ABNORMAL HIGH (ref 70–99)
Glucose-Capillary: 174 mg/dL — ABNORMAL HIGH (ref 70–99)
Glucose-Capillary: 163 mg/dL — ABNORMAL HIGH (ref 70–99)

## 2020-02-21 MED ORDER — IPRATROPIUM-ALBUTEROL 0.5-2.5 (3) MG/3ML IN SOLN
3.0000 mL | Freq: Two times a day (BID) | RESPIRATORY_TRACT | Status: DC
  Administered 2020-02-21 – 2020-03-07 (×29): 3 mL via RESPIRATORY_TRACT
  Filled 2020-02-21 (×29): qty 3

## 2020-02-21 MED ORDER — TORSEMIDE 20 MG PO TABS
40.0000 mg | ORAL_TABLET | Freq: Every day | ORAL | Status: DC
  Administered 2020-02-21 – 2020-03-10 (×18): 40 mg
  Filled 2020-02-21 (×18): qty 2

## 2020-02-21 NOTE — Progress Notes (Signed)
Pt not showing signs of distress remains on 4 lpm salter nasal cannula.  Patient now has cortrak in place which will not allow BIPAP to attempt to get a seal around patients face.  Patient is not alert enough to remove mask if he needs to in case he has other issues arise.  Spoke with charge RT as well as patients RN and we agreed on the decision to not place patient on BIPAP at this time.

## 2020-02-21 NOTE — Progress Notes (Signed)
Physical Therapy Treatment Patient Details Name: [name] MRN: [id number] DOB: 1945/11/19 Today's Date: 02/21/2020    History of Present Illness Pt is a 74 y.o. male with PMH of HTN, PAF, SSS, CHF, tachycardia, Heart failure, cerebral artery occlusion with cerebral infarction, OSA, chronic obstructive lung disease, pulmonary edema, DM, type II, CVA, DJD, stage 3 chronic kidney disease, AKI, Gout, depression, blindness, coarse tremors, CAD, L mid foot amputation, amd pacemaker. Pt sent to ED from outpatient imaging when pt had syncope episode, urinated on self, and became confused. CT reveals stable hemorrhage layering in each occipital horn of the lateral ventricles, stable. Pt intubated on 6/17 and extubated on 6/24.    PT Comments    Pt remains unable to participate in PT session due to inability to follow commands. Pt is able to be aroused by verbal/tactile stimuli this session however continues to demonstrate no command following to even basic tasks. Pt produces no purposeful movement of extremities during session, with only some activation of shoulder/elbow flexors noted during PROM of LUE, but pt is fighting against PT during this time. Pt has demonstrated no significant progress with acute PT services and demonstrates significantly limited potential to progress due to no progression in his ability to follow commands. Pt has met no acute PT goals at this time. Acute PT is signing off at this time due to inability to participate in skilled PT intervention. If the pt demonstrates the ability to follow commands please re-consult acute PT services. Staff and family are encouraged to perform PROM as able.   Follow Up Recommendations  SNF;LTACH;No PT follow up (SNF vs LTACH pending medical needs, no PT services)     Equipment Recommendations  Wheelchair (measurements PT);Wheelchair cushion (measurements PT);Hospital bed;Other (comment) (mechanical lift)    Recommendations for Other Services        Precautions / Restrictions Precautions Precautions: Fall Restrictions Weight Bearing Restrictions: No    Mobility  Bed Mobility Overal bed mobility: Needs Assistance             General bed mobility comments: pt assisted into bed in chair mode, totalA of bed  Transfers                    Ambulation/Gait                 Stairs             Wheelchair Mobility    Modified Rankin (Stroke Patients Only) Modified Rankin (Stroke Patients Only) Pre-Morbid Rankin Score: Moderately severe disability Modified Rankin: Severe disability     Balance                                            Cognition Arousal/Alertness: Lethargic (arouses to stimuli) Behavior During Therapy: Flat affect Overall Cognitive Status: Impaired/Different from baseline Area of Impairment: Attention;Following commands;Orientation;Memory;Safety/judgement;Awareness;Problem solving                 Orientation Level:  (unable to assess 2/2 impaired communication) Current Attention Level: Focused Memory: Decreased recall of precautions;Decreased short-term memory Following Commands:  (does not follow any commands) Safety/Judgement: Decreased awareness of deficits;Decreased awareness of safety Awareness: Intellectual Problem Solving: Decreased initiation General Comments: opens eyes and verbally responds to verbal/tactile stimulation but does not follow commands or produce any purposeful movement this session      Exercises Other Exercises  Other Exercises: PROM BLE ankle PF/DF, knee flexion/extension, hip flexion and internal rotation for 10 repetitions Other Exercises: PROM composite finger flexion/extension, wrist flexion/extension, elbow flexion/extension, and shoulder flexion 10 repetitions. Pt does seem to automatically flex shoulder and elbow initially during PROM of UE but this subsides despite PT cues to continue doing so    General Comments  General comments (skin integrity, edema, etc.): pt on 3L HFNC, VSS during session      Pertinent Vitals/Pain Pain Assessment: Faces Pain Score: 0-No pain Faces Pain Scale: No hurt    Home Living                      Prior Function            PT Goals (current goals can now be found in the care plan section) Acute Rehab PT Goals Patient Stated Goal: unable Progress towards PT goals: Not progressing toward goals - comment (goals not met, pt unable to participate, discharged)    Frequency    Other (Comment) (D/C PT services)      PT Plan Frequency needs to be updated (D/C PT services)    Co-evaluation              AM-PAC PT "6 Clicks" Mobility   Outcome Measure  Help needed turning from your back to your side while in a flat bed without using bedrails?: Total Help needed moving from lying on your back to sitting on the side of a flat bed without using bedrails?: Total Help needed moving to and from a bed to a chair (including a wheelchair)?: Total Help needed standing up from a chair using your arms (e.g., wheelchair or bedside chair)?: Total Help needed to walk in hospital room?: Total Help needed climbing 3-5 steps with a railing? : Total 6 Click Score: 6    End of Session Equipment Utilized During Treatment: Oxygen Activity Tolerance: Patient limited by lethargy;Other (comment) (cognition) Patient left: in bed;with call bell/phone within reach;with bed alarm set Nurse Communication: Mobility status;Need for lift equipment PT Visit Diagnosis: Other symptoms and signs involving the nervous system (E08.144)     Time: 8185-6314 PT Time Calculation (min) (ACUTE ONLY): 17 min  Charges:  $Therapeutic Exercise: 8-22 mins                     Zenaida Niece, PT, DPT Acute Rehabilitation Pager: [phone]    Zenaida Niece 02/21/2020, 4:41 PM

## 2020-02-21 NOTE — Progress Notes (Signed)
  Speech Language Pathology Treatment: Dysphagia  Patient Details Name: [name] MRN: [id number] DOB: 11/26/1945 Today's Date: 02/21/2020 Time: 6433-2951 SLP Time Calculation (min) (ACUTE ONLY): 17 min  Assessment / Plan / Recommendation Clinical Impression  [name] demonstrated improved alertness/participation/oral response today. Pt was provided oral care with use of suction swab. Dry secretions were removed from mouth. Pt was noted to be biting down hard on toothbrush and suction. He had absent oral response to small ice chip, but given quarter-tsp of water, pt quickly closed mouth and swallowed without s/s aspiration in 3 trials. Given larger thin bolus (tsp), pt again had fast response and swallow, but had strong immediate cough response after with noted increased respiratory rate to 25 (from 17 where it had been resting). At this time, continue NPO recommendations with need for diligent oral care QID. ST service to follow pending ongoing improvement in mentation and ability to progress. Suspect alternative means of nutrition will be needed regardless to meet nutritional needs.   HPI HPI: Pt is a 74 year old with prior intracranial hemorrhage and blindness who presented with 3 days of headaches Head CT increased but still small volume intraventricular hemorrhage layering in the occipital horns of the lateral ventricles." Unable to perform MRI. BSE performed 10/04/19 recommended NPO, 01/27/20 NPO and unable to initiate po's due to respiratory status with decline and intubated 6/17-6/24, BiPAP 6/25 and discontinued during the day. Cortak disloged 7/5 and pulled 7/6. Per MD notes "pt's encephalopathy is improving somewhat but overall prognosis remains very poor".      SLP Plan  Continue with current plan of care       Recommendations  Diet recommendations: NPO Medication Administration: Via alternative means                Oral Care Recommendations: Oral care QID Follow up  Recommendations: Skilled Nursing facility SLP Visit Diagnosis: Dysphagia, unspecified (R13.10) Plan: Continue with current plan of care                      [name], M.S., CCC-SLP Speech-Language Pathologist Acute Rehabilitation Services Pager: [place] 02/21/2020, 4:01 PM

## 2020-02-21 NOTE — Progress Notes (Signed)
Nutrition Follow-up  DOCUMENTATION CODES:   Obesity unspecified  INTERVENTION:  Continue Glucerna 1.2 formula via Cortrak NGT at goal rate of 60 ml/hr.   Continue 60 ml Prostat BID per tube.   Free water flushes of 200 ml every 4 hours per tube. (MD to adjust as appropriate)  Tube feeding to provide 2128 kcal, 146 grams of protein, and 2366 ml free water.   NUTRITION DIAGNOSIS:   Inadequate oral intake related to inability to eat as evidenced by NPO status; ongoing  GOAL:   Patient will meet greater than or equal to 90% of their needs; met with TF  MONITOR:   Weight trends, Labs, I & O's, TF tolerance  REASON FOR ASSESSMENT:   Consult Enteral/tube feeding initiation and management  ASSESSMENT:   Pt with a PMH significant for Afib, cardiomyopathy, PPM, coronary disease, HTN, legally blind, T2DM, HTN, and HLD presented with N/V, headache, recent fall, recent syncope and AMS. Pt admitted with [place]. 6/24 extubated.   Pt not interactive and nonverbal during time of visit. Cortrak NGT replaced yesterday. Tip of tube in stomach. Goals of care discussion ongoing with family. PEG discussion ongoing. Pt has been tolerating his tube feeds via Cortrak. Will continue with current orders.   Labs and medications reviewed.   Diet Order:   Diet Order    None      EDUCATION NEEDS:   Not appropriate for education at this time  Skin:  Skin Assessment: Reviewed RN Assessment Skin Integrity Issues::  (pressure injury: nose) Other: amputation all L toes  Last BM:  7/5  Height:   Ht Readings from Last 1 Encounters:  01/31/20 '5\' 9"'$  (1.753 m)    Weight:   Wt Readings from Last 1 Encounters:  02/18/20 (!) 139.4 kg    Ideal Body Weight:  72.7 kg  BMI:  Body mass index is 45.38 kg/m.  Estimated Nutritional Needs:   Kcal:  1900-2100  Protein:  140-160 grams  Fluid:  2 L/day   Corrin Parker, MS, RD, LDN RD pager number/after hours weekend pager number on  Amion.

## 2020-02-21 NOTE — Progress Notes (Addendum)
Subjective: Saw patient at bedside. He continues to be encephalopathic. Responds with "yes" when his name is called out but today he was unable to raise either arm when asked to do so. Was having more labored breathing today but was able to tolerate a decrease in his nasal canula to 5L/min and still have a sat of 100%.  Objective:  Vital signs in last 24 hours: Vitals:   02/20/20 2020 02/20/20 2056 02/21/20 0513 02/21/20 0607  BP:   (!) 141/69 130/72  Pulse:   77   Resp:   (!) 25   Temp:  98.5 F (36.9 C) 99.6 F (37.6 C)   TempSrc:  Axillary Axillary   SpO2: 97% 96%    Weight:      Height:       Physical Exam Vitals and nursing note reviewed.  Constitutional:      Appearance: He is obese.  HENT:     Head: Normocephalic and atraumatic.  Cardiovascular:     Rate and Rhythm: Normal rate and regular rhythm.     Pulses:          Radial pulses are 2+ on the right side and 2+ on the left side.       Dorsalis pedis pulses are 2+ on the right side and 2+ on the left side.     Heart sounds: Normal heart sounds, S1 normal and S2 normal. No murmur heard.   Pulmonary:     Effort: Tachypnea and accessory muscle usage present. No respiratory distress.  Abdominal:     Palpations: There is no mass.     Tenderness: There is no abdominal tenderness.     Hernia: No hernia is present.  Neurological:     Mental Status: He is lethargic, disoriented and confused.      Assessment/Plan:  Principal Problem:   ICH (intracerebral hemorrhage) (HCC) Active Problems:   Intraventricular hemorrhage (HCC)   Encephalopathy   Goals of care, counseling/discussion   Palliative care by specialist   Chronic respiratory failure with hypoxia and hypercapnia (HCC)   Acute on chronic respiratory failure with hypoxia and hypercapnia (HCC)   Aspiration into airway   Acute respiratory failure (HCC)   Hypervolemia  Goals of Care:  On-going, no decisions have been made. Our team will speak with  daughter again today.   Encephalopathy:  Multifactorial due to respiratory failure, ICH, AKI. Repeat CT (7/2) showed no acute changes. 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 -Maintain NPO -Coretrak replaced with family's permission -Unclear what his new baseline will be.  -Neuro was consulted for assessment recommends palliative care as they felt that patient would not recover to a point were he could be independent or able to manage his own affairs -Currently has mittens on to prevent him removing any equipment     Acute Hypercarbic and Hypoxemic Respiratory Failure  Aspiration PNA - finished antibiotics  Was Extubated on 6/24. Continues to require HFNC during the day and BiPAP QHS. Pulmonology has recommended against re-intubation. Stable O2 sats will continue to monitor.    AKI on CKD Stage IV: Per Nephrology consult during admission dialysis is not recommended. BUN and Creatinine have improved slightly  -Continue monitoring Kidney function -BUN improved to 63 and Creatinine improved to 1.91 - I&Os have been negative for the past few days; will increase torsemide 20 -> 40 mg per tube today   Hypernatremia: Resolved In the setting of coretrak being replaced, have resumed getting free water per tube. Na  this morning 145 -D10W IV fluids have been stopped  -Free water per tube -Daily BMP   Hypertension:  With CoreTrak in place have switched from IV to PO medications for control. -Stopped IV meds -Resumed PO meds per tube. pressure have remained stable will continue to monitor   T2DM: Glucose levels have been stable and well controlled over night -Continue sliding scale for glucose control      Prior to Admission Living Arrangement: Home Anticipated Discharge Location: Possibly to LTACH depending on family Barriers to Discharge: Encephalopathy and goals of care discussion with family Dispo: Anticipated discharge pending ongoing goals of care conversation with family    Briant Cedar, MD 02/21/2020, 6:09 AM Pager: [phone] After 5pm on weekdays and 1pm on weekends: On Call pager [phone]

## 2020-02-21 NOTE — Progress Notes (Signed)
STROKE TEAM PROGRESS NOTE   INTERVAL HISTORY Patient is lying in bed.  He is awake but not interactive and nonverbal.  I spoke to the patient's daughter [name] over the phone and explained his prognosis and answered questions.  OBJECTIVE Vitals:   02/21/20 0722 02/21/20 0831 02/21/20 1200 02/21/20 1350  BP: 140/69 (!) 149/74 138/73 138/73  Pulse: 77 77 77 74  Resp: (!) 26 (!) 25 (!) 23 (!) 26  Temp: 99 F (37.2 C)  99 F (37.2 C)   TempSrc: Oral  Oral   SpO2: 98% 100% 98% 100%  Weight:      Height:        CBC:  Recent Labs  Lab 02/15/20 0650 02/15/20 0832 02/16/20 0637 02/19/20 1056  WBC 11.0*   < > 10.6* 10.5  NEUTROABS 8.3*  --   --   --   HGB 9.6*   < > 9.3* 10.4*  HCT 31.7*   < > 31.9* 36.8*  MCV 82.6   < > 83.9 85.0  PLT 123*   < > 120* 154   < > = values in this interval not displayed.    Basic Metabolic Panel:  Recent Labs  Lab 02/20/20 1432 02/21/20 0416  NA 146* 145  K 3.5 3.6  CL 109 107  CO2 28 27  GLUCOSE 100* 101*  BUN 66* 63*  CREATININE 1.90* 1.91*  CALCIUM 9.4 9.5    Lipid Panel:     Component Value Date/Time   CHOL 103 01/27/2020 0317   CHOL 129 01/05/2017 1235   TRIG 104 02/02/2020 0614   HDL 31 (L) 01/27/2020 0317   HDL 36 (L) 01/05/2017 1235   CHOLHDL 3.3 01/27/2020 0317   VLDL 14 01/27/2020 0317   LDLCALC 58 01/27/2020 0317   LDLCALC 71 01/05/2017 1235   HgbA1c:  Lab Results  Component Value Date   HGBA1C 8.9 (H) 01/26/2020   Urine Drug Screen:     Component Value Date/Time   LABOPIA NONE DETECTED 10/01/2013 1915   COCAINSCRNUR NONE DETECTED 10/01/2013 1915   COCAINSCRNUR NEG 12/16/2008 2246   LABBENZ NONE DETECTED 10/01/2013 1915   LABBENZ NEG 12/16/2008 2246   AMPHETMU NONE DETECTED 10/01/2013 1915   THCU NONE DETECTED 10/01/2013 1915   LABBARB NONE DETECTED 10/01/2013 1915    Alcohol Level     Component Value Date/Time   ETH <11 10/01/2013 1504    IMAGING  CT HEAD WO CONTRAST 01/26/2020 IMPRESSION:   Unchanged intraventricular blood layering within the occipital horns of the lateral ventricles.   CT Head Wo Contrast 01/25/2020 IMPRESSION:  There is hemorrhage in the dependent portion of each lateral ventricle of uncertain etiology. No brain parenchymal hemorrhage elsewhere noted. No mass. No extra-axial fluid. There is mild atrophy with extensive periventricular small vessel disease. No acute appearing infarct is evident. There are multiple foci of arterial vascular calcification. There is mucosal thickening in several ethmoid air cells.   DG Chest Portable 1 View 01/25/2020 IMPRESSION:  No active disease. Chronic cardiomegaly and pulmonary vascular congestion.   US Abdomen Limited RUQ 01/25/2020 IMPRESSION:  Gallbladder sludge and gallstones without complicating factors. Right renal cysts stable from prior CT examination.    ECG - paced - rate 69 BPM.    PHYSICAL EXAM  Temp:  [98.5 F (36.9 C)-99.6 F (37.6 C)] 99 F (37.2 C) (07/08 1200) Pulse Rate:  [62-77] 74 (07/08 1350) Resp:  [19-26] 26 (07/08 1350) BP: (130-158)/(69-81) 138/73 (07/08 1350) SpO2:  [96 %-100 %]  100 % (07/08 1350)  General -obese elderly African-American male not in distress  Ophthalmologic - fundi not visualized due to noncooperation.  Cardiovascular - Regular rhythm and rate.  Neuro -he is awake alert but globally aphasic and not following commands or interacting.  He does not blink to threat on either side.  Pupils not identified bilaterally.  Fundi not visualized.  Only moaning on pain   but not following commands. Bilateral eyes s/p surgery,   Eye mid position, slow doll's eyes. Positive gag cough and corneal. No clear facial droop. Tongue protrusion not cooperative. Not blinking to visual threat with eyes opening.  Has spontaneous bilateral upper extremity movements against gravity with pain stimulation, withdraw to BLEs and purposeful movement BLEs. DTR 1+ and no babinski. Sensation, coordination  and gait not tested.    ASSESSMENT/PLAN Mr. Akeem Heppler is a 74 y.o. male with history of atrial fibrillation not on anticoagulation due to history of rectal bleeding (on ASA), cardiomyopathy, PPM, coronary disease, hypertension, legally blind, diabetes type 2, hypertension, hyperlipidemia and morbid obesity  presenting with nausea, vomiting, headache, recent fall, recent syncope, and AMS. He did not receive IV t-PA due to [place].  [name] - bilateral lateral ventricle small [name], etiology unclear, could be traumatic due to fall vs. spontaneous  CT Head - 01/25/20 - There is hemorrhage in the dependent portion of each lateral ventricle of uncertain etiology.   CT head - 01/26/20 - Unchanged intraventricular blood layering within the occipital horns of the lateral ventricles.   CT head 01/27/20 - slight increase of [name], borderline increase in ventriculomegaly  CT head repeat 01/28/20 - unchanged  MRI head - not able to do due to incompatible PPM   Carotid Doppler - unremarkable  2D Echo - EF 60-65%  [employer] Virus 2 - negative  LDL - 58  HgbA1c - 8.9  VTE prophylaxis - SCDs  aspirin 81 mg daily prior to admission, now on No antithrombotic due to [name]  Ongoing aggressive stroke risk factor management  Therapy recommendations: SNF   disposition:  Pending  Metabolic encephalopathy  Pt sleepy drowsy out of proportion for brain pathology - however, it could be the case if pt had baseline cognitive impairment   Etiology unclear  Not on sedation  Elevated LFT - AST/ALT 52/119 ->33/74  Elevated ammonia - 42->36  CKD IV with elevated BUN 75->77->72 and Cre 2.37->2.66->2.39  EEG pending to rule out seizure  Management per primary team  Hypertension  Home BP meds: amlodipine ; apresoline ; metoprolol ; Demadex ; Zaroxolyn   Current BP meds: amlodipine ; hydralazine ; Demadex; metoprolol -> off due to no po access -> plan for cortrak and the restart po meds  Stable at  140-150s . SBP goal < 140 mm Hg . Long-term BP goal normotensive  Hyperlipidemia  Home Lipid lowering medication: Crestor 20 mg daily  LDL 58, goal < 70  Hold statin for now due to elevated LFT and [name]  Consider to resume statin at discharge  Diabetes  Home diabetic meds: insulin  Current diabetic meds: lantus  HgbA1c 8.9, goal < 7.0  Hyperglycemia  SSI  CBG monitoring  Close PCP follow-up for better DM control  PAF  Not on AC due to [place] in 2015 (see below)  Not AC candidate now due to [name]  On ASA PTA  Hx of ICH  10/2013 - admitted for AMS found to have left temporoparietal ICH and edema. Concerning for hemorrhagic conversion but not able  to have MRI done. Also had SOB due to PE from mobile mass (most likely clot vs unlikely vegetation) found on pacer wire in RA extending into RV. Not an anticoagulation candidate for treatment due to [place]. Placed on empiric abs for possible endocarditis/RA vegetation on wire - completed course ceftriaxone on 2/26. On aspirin 81 mg orally every day prior to admission.  Now on aspirin 81 mg orally every day.   Dysphagia  Not able to pass swallow due to altered mental status  Currently n.p.o.  Requested cortrak and TF  Consider to resume po meds after cortrak  Speech on board  Other Stroke Risk Factors  Advanced age  Former cigarette smoker - quit  ETOH use, advised to drink no more than 1 alcoholic beverage per day.  Obesity, Body mass index is 45.38 kg/m., recommend weight loss, diet and exercise as appropriate   Coronary artery disease  Obstructive sleep apnea, on CPAP at home  Congestive Heart Failure s/p pacer  Other Active Problems  Code status - Full code  Legally blind - b/l eye surgery  PPM for bradycardia  Hospital day # 27 Patient had previous history of intracerebral hemorrhage likely residual aphasia and more recently intraventricular hemorrhage with significant cognitive impairment.  He has not  made much neurological improvement over the last 1 month which is a poor prognostic sign and I doubt patient will recover to the point that he can be independent and manages own affairs.  He will likely need a PEG tube for nutritional needs.  I spoke to the patient's daughter and explained poor prognosis and if she needs to pursue ongoing care patient will likely need a PEG tube since his swallowing has not improved in the last 1 month.  She stated she needed to discuss with another family member today and you would be able to make a decision by tomorrow afternoon and requested that the primary team give her a call at 3 PM tomorrow.  She was given opportunity to ask questions which were answered..  Discussed with medical team intern on call.  Kindly call for questions.  Greater than 50% time during this 25-minute visit was spent on counseling and coordination of care and answering questions.  Stroke team will sign off.  Kindly call for questions Antony Contras, MD Stroke Neurology 02/21/2020 2:45 PM    To contact Stroke Continuity provider, please refer to [email or web address] After hours, contact General Neurology

## 2020-02-22 LAB — BASIC METABOLIC PANEL
Anion gap: 10 (ref 5–15)
BUN: 74 mg/dL — ABNORMAL HIGH (ref 8–23)
CO2: 27 mmol/L (ref 22–32)
Calcium: 9.5 mg/dL (ref 8.9–10.3)
Chloride: 105 mmol/L (ref 98–111)
Creatinine, Ser: 2.17 mg/dL — ABNORMAL HIGH (ref 0.61–1.24)
GFR calc Af Amer: 34 mL/min — ABNORMAL LOW (ref >60)
GFR calc non Af Amer: 30 mL/min — ABNORMAL LOW (ref >60)
Glucose, Bld: 133 mg/dL — ABNORMAL HIGH (ref 70–99)
Potassium: 4.1 mmol/L (ref 3.5–5.1)
Sodium: 142 mmol/L (ref 135–145)

## 2020-02-22 LAB — CBC
HCT: 35.7 % — ABNORMAL LOW (ref 39.0–52.0)
Hemoglobin: 10.2 g/dL — ABNORMAL LOW (ref 13.0–17.0)
MCH: 24.5 pg — ABNORMAL LOW (ref 26.0–34.0)
MCHC: 28.6 g/dL — ABNORMAL LOW (ref 30.0–36.0)
MCV: 85.6 fL (ref 80.0–100.0)
Platelets: 135 10*3/uL — ABNORMAL LOW (ref 150–400)
RBC: 4.17 MIL/uL — ABNORMAL LOW (ref 4.22–5.81)
RDW: 18.6 % — ABNORMAL HIGH (ref 11.5–15.5)
WBC: 13.6 10*3/uL — ABNORMAL HIGH (ref 4.0–10.5)
nRBC: 0 % (ref 0.0–0.2)

## 2020-02-22 LAB — GLUCOSE, CAPILLARY
Glucose-Capillary: 123 mg/dL — ABNORMAL HIGH (ref 70–99)
Glucose-Capillary: 127 mg/dL — ABNORMAL HIGH (ref 70–99)
Glucose-Capillary: 128 mg/dL — ABNORMAL HIGH (ref 70–99)
Glucose-Capillary: 138 mg/dL — ABNORMAL HIGH (ref 70–99)
Glucose-Capillary: 165 mg/dL — ABNORMAL HIGH (ref 70–99)
Glucose-Capillary: 95 mg/dL (ref 70–99)

## 2020-02-22 NOTE — Progress Notes (Signed)
Subjective: Patient was examined bedside. He was non-response  to calling his name. He made no movements during the exam.  Objective:  Vital signs in last 24 hours: Vitals:   02/22/20 0351 02/22/20 0500 02/22/20 0535 02/22/20 0609  BP: (!) 156/85  (!) 161/84 (!) 179/86  Pulse: 77     Resp: (!) 35     Temp:      TempSrc:      SpO2:      Weight:  (!) 141.6 kg    Height:       Physical Exam Vitals and nursing note reviewed.  Constitutional:      Appearance: He is obese.  Cardiovascular:     Rate and Rhythm: Normal rate and regular rhythm.     Pulses:          Radial pulses are 2+ on the right side and 2+ on the left side.       Dorsalis pedis pulses are 2+ on the right side and 2+ on the left side.     Heart sounds: Normal heart sounds, S1 normal and S2 normal. No murmur heard.   Pulmonary:     Effort: Accessory muscle usage present. No respiratory distress.  Abdominal:     Palpations: There is no mass.     Tenderness: There is no abdominal tenderness.  Neurological:     Mental Status: He is lethargic, disoriented and confused.      Assessment/Plan:  Principal Problem:   ICH (intracerebral hemorrhage) (HCC) Active Problems:   Intraventricular hemorrhage (HCC)   Encephalopathy   Goals of care, counseling/discussion   Palliative care by specialist   Chronic respiratory failure with hypoxia and hypercapnia (HCC)   Acute on chronic respiratory failure with hypoxia and hypercapnia (HCC)   Aspiration into airway   Acute respiratory failure (HCC)   Hypervolemia  Goals of Care: On-going,no decisions have been made. Our team has scheduled to speak with daughter again today at [place]. Dr. Hilma Favors of [facility] has stated she believes that it is appropriate to focus on providing symptom management and comfort measures. And that if the family is unwilling to make a decision that [place] would be appropriate.  Encephalopathy:    Multifactorial due to respiratory failure, ICH, AKI. Repeat CT (7/2) showed no acute changes. 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 -SLP evaluated yesterday and recommends continued NPO due to strong immediate cough response to large thin bolus (tsp) -Neuro was consulted for assessment recommends palliative care as they felt that patient would not recover to a point were he could be independent or able to manage his own affairs -Currently has mittens on to prevent him removing any equipment   Acute Hypercarbic and Hypoxemic Respiratory Failure Aspiration PNA- finished antibiotics  Was Extubated on 6/24. Pulmonology has recommended against re-intubation Continues to require HFNC. Due to CoreTrak BiPAP is unable to be used.Stable O2 sats will continue to monitor. Currently on 4 L/min HFNC.   AKI on CKD Stage IV: Per Nephrology consult during admission dialysis is not recommended.BUN and Creatinine have slightly improved over last few days but today they have worsened -Continue monitoring Kidney function -BUN worsened to 74 and Creatinine worsened to 2.17 - Net I&Os yesterday is out 1425, continue torsemide 40 mg per tube   Hypernatremia: Resolved With CoreTrak in place he is getting free water per tube. -Na this morning145 -No IV fluids currently -Free water per tube -Daily BMP  Hypertension:  With  CoreTrakin place have switched from IV to PO medications for control. -StoppedIV meds -Resumed PO medsper tube. pressurehave remainedstable will continue to monitor   T2DM: Glucose levelshavebeen stable and well controlled over night -Continue sliding scale for glucose control   Prior to Admission Living Arrangement: Home Anticipated Discharge Location: Hospice Barriers to Discharge: Encephalopathy and goals of care discussion with family Dispo: Anticipated discharge pending ongoing goals of care conversation with family    Briant Cedar,  MD 02/22/2020, 6:14 AM Pager: [phone] After 5pm on weekdays and 1pm on weekends: On Call pager [phone]

## 2020-02-22 NOTE — Progress Notes (Signed)
Palliative Care Follow-up for Goals of Care  [name] has been followed by our service and we have hosted multiple conversations with his family. The family involvement demonstrates a pattern is avoiding and deferring difficult decisions. Given his poor prognosis and significant neurological impairment coupled with many end-stage chronic medical problems it would be appropriate to focus his treatment on providing symptom management and comfort measures.  It is my professional opinion that with a high degree of medical certainty that [name] has irreversible neurological deficits resulting in significant suffering including inability to communicate, follow commands or participate in any form of ADLs. He is requiring artifical nutrition support because of profound dysphagia and will likely progress to respiratory failure within hours to days- with or without a PEG tube which will not prevent aspiration. His condition is incurable, irreversible and he has received all treatments indicated for his condition without improvement. These issues will result in his death in a relatively short period of time.  I do not believe that PEG tube will improve his condition or QOL nor will it prolong his life. If family unable to make a decision about code status we should proceed with unilateral DNR and follow [place]'s Medical Futility Policy moving forward. I will communicate this to family if Stroke Service and Primary Team are in agreement.  Lane Hacker, DO Palliative Medicine [phone]

## 2020-02-22 NOTE — Progress Notes (Signed)
Patient still with cortrak in place and not alert enough to remove BiPAP mask if needed to therefor will keep patient on 4 Lpm nasal cannula at this time. Vitals stable.

## 2020-02-22 NOTE — Progress Notes (Signed)
Occupational Therapy Treatment and Discharge Patient Details Name: [name] MRN: [id number] DOB: 28-Jun-1946 Today's Date: 02/22/2020    History of present illness Pt is a 74 y.o. male with PMH of HTN, PAF, SSS, CHF, tachycardia, Heart failure, cerebral artery occlusion with cerebral infarction, OSA, chronic obstructive lung disease, pulmonary edema, DM, type II, CVA, DJD, stage 3 chronic kidney disease, AKI, Gout, depression, blindness, coarse tremors, CAD, L mid foot amputation, amd pacemaker. Pt sent to ED from outpatient imaging when pt had syncope episode, urinated on self, and became confused. CT reveals stable hemorrhage layering in each occipital horn of the lateral ventricles, stable. Pt intubated on 6/17 and extubated on 6/24.   OT comments  Pt without appreciable progress in a reasonable amount of time. Performed PROM B UE. Pt responds to pain with withdrawal and opens eyes to auditory stimulation. He does not follow commands and requires total care. Recommended alternating air mattress to RN. Signing off.  Follow Up Recommendations  SNF;Supervision/Assistance - 24 hour    Equipment Recommendations   (defer to next venue)    Recommendations for Other Services      Precautions / Restrictions Precautions Precautions: Fall Precaution Comments: cortrak       Mobility Bed Mobility Overal bed mobility: Needs Assistance Bed Mobility: Rolling Rolling: Total assist;+2 for physical assistance            Transfers                      Balance                                           ADL either performed or assessed with clinical judgement   ADL                                         General ADL Comments: total assist     Vision       Perception     Praxis      Cognition Arousal/Alertness: Awake/alert;Lethargic (opens eyes with auditory stimulation) Behavior During Therapy: Flat affect                                    General Comments: opens eyes to auditory stimuli, no attempt to communicate with gestures or verbally, does not follow commands        Exercises Exercises: General Upper Extremity General Exercises - Upper Extremity Shoulder Flexion: PROM;Both;10 reps;Supine Elbow Flexion: PROM;Both;10 reps;Supine Elbow Extension: PROM;Both;10 reps;Supine Wrist Flexion: PROM;Both;10 reps;Supine Wrist Extension: PROM;Both;10 reps;Supine Digit Composite Flexion: PROM;Both;10 reps;Supine Composite Extension: PROM;Both;10 reps;Supine   Shoulder Instructions       General Comments      Pertinent Vitals/ Pain       Pain Assessment: Faces Pain Location: reponds to pain--sternal rub, fingernail pressure Pain Descriptors / Indicators:  (withdraws)  Home Living                                          Prior Functioning/Environment              Frequency  Progress Toward Goals  OT Goals(current goals can now be found in the care plan section)  Progress towards OT goals: Not progressing toward goals - comment  Acute Rehab OT Goals Patient Stated Goal: unable  Plan Other (comment) (pt without progress, discharging)    Co-evaluation                 AM-PAC OT "6 Clicks" Daily Activity     Outcome Measure   Help from another person eating meals?: Total Help from another person taking care of personal grooming?: Total Help from another person toileting, which includes using toliet, bedpan, or urinal?: Total Help from another person bathing (including washing, rinsing, drying)?: Total Help from another person to put on and taking off regular upper body clothing?: Total Help from another person to put on and taking off regular lower body clothing?: Total 6 Click Score: 6    End of Session Equipment Utilized During Treatment: Oxygen  OT Visit Diagnosis: Other symptoms and signs involving cognitive function;Other symptoms and  signs involving the nervous system (R29.898);Low vision, both eyes (H54.2);Apraxia (R48.2);Muscle weakness (generalized) (M62.81)   Activity Tolerance Patient tolerated treatment well   Patient Left in bed;with call bell/phone within reach;with nursing/sitter in room;with bed alarm set   Nurse Communication          Time: 9147-8295 OT Time Calculation (min): 19 min  Charges: OT General Charges $OT Visit: 1 Visit OT Treatments $Therapeutic Exercise: 8-22 mins  Nestor Lewandowsky, OTR/L Acute Rehabilitation Services Pager: [phone] Office: [phone]   Malka So 02/22/2020, 3:43 PM

## 2020-02-23 LAB — BASIC METABOLIC PANEL
Anion gap: 11 (ref 5–15)
BUN: 88 mg/dL — ABNORMAL HIGH (ref 8–23)
CO2: 27 mmol/L (ref 22–32)
Calcium: 9.5 mg/dL (ref 8.9–10.3)
Chloride: 106 mmol/L (ref 98–111)
Creatinine, Ser: 2.39 mg/dL — ABNORMAL HIGH (ref 0.61–1.24)
GFR calc Af Amer: 30 mL/min — ABNORMAL LOW (ref >60)
GFR calc non Af Amer: 26 mL/min — ABNORMAL LOW (ref >60)
Glucose, Bld: 113 mg/dL — ABNORMAL HIGH (ref 70–99)
Potassium: 4.1 mmol/L (ref 3.5–5.1)
Sodium: 144 mmol/L (ref 135–145)

## 2020-02-23 LAB — GLUCOSE, CAPILLARY
Glucose-Capillary: 106 mg/dL — ABNORMAL HIGH (ref 70–99)
Glucose-Capillary: 119 mg/dL — ABNORMAL HIGH (ref 70–99)
Glucose-Capillary: 129 mg/dL — ABNORMAL HIGH (ref 70–99)
Glucose-Capillary: 144 mg/dL — ABNORMAL HIGH (ref 70–99)
Glucose-Capillary: 144 mg/dL — ABNORMAL HIGH (ref 70–99)
Glucose-Capillary: 155 mg/dL — ABNORMAL HIGH (ref 70–99)
Glucose-Capillary: 85 mg/dL (ref 70–99)

## 2020-02-23 NOTE — Progress Notes (Signed)
Subjective: Patient seen at bedside. Continues to be encephalopathic.  He is vitals are relatively stable at this time however continues to have increased work of breathing.  Objective:  Vital signs in last 24 hours: Vitals:   02/23/20 0327 02/23/20 0436 02/23/20 0439 02/23/20 0539  BP: (!) 143/87 (!) 169/91  (!) 158/90  Pulse: 78     Resp: (!) 31     Temp:      TempSrc:      SpO2:      Weight:   (!) 139.7 kg   Height:       General: Elderly male, ill-appearing, increased work of breathing Cardiac: RRR, no murmurs rubs or gallops Pulmonary increased work of breathing, no wheezing or rhonchi noted Neuro: Lethargic, disoriented, not following commands, intermittently moves left hand  Assessment/Plan:  Principal Problem:   ICH (intracerebral hemorrhage) (HCC) Active Problems:   Intraventricular hemorrhage (HCC)   Encephalopathy   Goals of care, counseling/discussion   Palliative care by specialist   Chronic respiratory failure with hypoxia and hypercapnia (HCC)   Acute on chronic respiratory failure with hypoxia and hypercapnia (HCC)   Aspiration into airway   Acute respiratory failure (HCC)   Hypervolemia  This is a 74 year old male with a history of atrial fibrillation not on anticoagulation 2/2 rectal bleed, CM,, CAD, HTN, legally blind, DM, HTN, HLD and morbid obesity who initially presented with n/v headaches and recent falls and found to have ICH. Developed aspiration pneumonia requiring intubation. Hospital course now complicated by continued encephalopathy with poor prognosis.   Goals of Care: Continues to be addressed with the family, they have not made any decisions.  Dr. Hilma Favors of palliative care feels that it is appropriate to focus on continued symptom management and comfort measures and that a PEG tube or tracheostomy would not provide any benefit or any quality of life.  Strongly recommends that DNR and comfort measures are more appropriate, and that if family  is not able to make a decision that [place]'s futility policy would be appropriate. There is a family meeting scheduled for Monday at 5 PM with Dr. Philipp Ovens and Dr. Hilma Favors.  Encephalopathy:  Multifactorial due to respiratory failure, ICH, Uremia. Repeat CT (7/2) showed no acute changes. 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 No reversible causes have been identified. -SLP evaluated and recommends continued NPO due to strong immediate cough response to large thin bolus (tsp) -Neurowasconsulted for assessmentrecommends palliative care as they felt that patient would not recover to a point were he could be independent or able to manage his own affairs -Currently has mittens on to prevent him removing any equipment   Acute Hypercarbic and Hypoxemic Respiratory Failure Aspiration PNA- finished antibiotics  Was Extubated on 6/24. Pulmonology has recommended against re-intubation.Continues to [place]. Due to CoreTrak BiPAP is unable to be used.Stable O2 sats will continue to monitor. Currently on 4 L/min HFNC. He has increased work of breathing and is at a high risk of developing respiratory distress.   AKI on CKD Stage IV: Per Nephrology consult during admission dialysis is not recommended.BUN and Cr continue to worsen.  -Continue monitoring Kidney function - Net I&Os yesterday is out 1425, continue torsemide 40 mg per tube  Hypernatremia:Resolved With CoreTrak in place he isgettingfree water per tube. -Na this morning145 -No IV fluids currently -Free water per tube -Daily BMP  Hypertension: WithCoreTrakin place have switched from IV to PO medications for control. -StoppedIV meds -Resumed PO medsper tube.pressurehave remainedstable will  continue to monitor  T2DM: Glucose levelshavebeenstable and well controlled over night -Continue sliding scale for glucose control  Prior to Admission Living Arrangement: Home Anticipated Discharge Location:  TBD Barriers to Discharge: Encephalopathy and goals of care discussion with family Dispo: Anticipated discharge pending ongoing goals of care conversation with family   Asencion Noble, MD 02/23/2020, 6:32 AM Pager: [phone] After 5pm on weekdays and 1pm on weekends: On Call pager [phone]

## 2020-02-24 LAB — GLUCOSE, CAPILLARY
Glucose-Capillary: 68 mg/dL — ABNORMAL LOW (ref 70–99)
Glucose-Capillary: 69 mg/dL — ABNORMAL LOW (ref 70–99)
Glucose-Capillary: 92 mg/dL (ref 70–99)
Glucose-Capillary: 78 mg/dL (ref 70–99)
Glucose-Capillary: 140 mg/dL — ABNORMAL HIGH (ref 70–99)
Glucose-Capillary: 102 mg/dL — ABNORMAL HIGH (ref 70–99)
Glucose-Capillary: 83 mg/dL (ref 70–99)

## 2020-02-24 MED ORDER — INSULIN DETEMIR 100 UNIT/ML ~~LOC~~ SOLN
50.0000 [IU] | Freq: Two times a day (BID) | SUBCUTANEOUS | Status: DC
  Administered 2020-02-24 – 2020-03-03 (×13): 50 [IU] via SUBCUTANEOUS
  Filled 2020-02-24 (×18): qty 0.5

## 2020-02-24 MED ORDER — DEXTROSE 50 % IV SOLN
12.5000 g | INTRAVENOUS | Status: AC
  Administered 2020-02-24: 12.5 g via INTRAVENOUS

## 2020-02-24 MED ORDER — DEXTROSE 50 % IV SOLN
INTRAVENOUS | Status: AC
  Filled 2020-02-24: qty 50

## 2020-02-24 NOTE — Progress Notes (Signed)
Pt has flow trac in place and not alert enough to remove bipap if necessary.  Pt on 3LPM [place] at this time and in no immediate distress.  RT will continue to monitor.

## 2020-02-24 NOTE — Progress Notes (Signed)
Hypoglycemic Event  CBG: 68  Treatment: Hypoglycemic Treatment Administered Dextrose 50% solution. Dose 12.5 g via IV  Symptoms: None   Follow-up CBG: Time:0356 CBG Result:92  Possible Reasons for Event: unknown   Comments/MD notified:Tolerated well and IMTS MD notified.     Sharin Mons

## 2020-02-24 NOTE — Progress Notes (Signed)
Subjective: Patient seen at bedside.  Continues to remain encephalopathic.  Continues to have mildly increased work of breathing, currently on high flow nasal cannula.  Objective:  Vital signs in last 24 hours: Vitals:   02/23/20 2231 02/23/20 2351 02/24/20 0304 02/24/20 0500  BP: (!) 172/86 (!) 157/72 (!) 152/78   Pulse:  70 73   Resp:  (!) 32 (!) 29   Temp:  98.7 F (37.1 C) 98.2 F (36.8 C)   TempSrc:  Axillary Axillary   SpO2:  97% 99%   Weight:    (!) 140.8 kg  Height:       General: Elderly male, ill-appearing, increased work of breathing, encephalopathic  Cardiac: RRR, no murmurs rubs or gallops Pulmonary increased work of breathing, no wheezing or rhonchi noted Neuro: Lethargic, disoriented, not following commands  Assessment/Plan:  Principal Problem:   ICH (intracerebral hemorrhage) (HCC) Active Problems:   Intraventricular hemorrhage (HCC)   Encephalopathy   Goals of care, counseling/discussion   Palliative care by specialist   Chronic respiratory failure with hypoxia and hypercapnia (HCC)   Acute on chronic respiratory failure with hypoxia and hypercapnia (HCC)   Aspiration into airway   Acute respiratory failure (HCC)   Hypervolemia  This is a 74 year old male with a history of atrial fibrillation not on anticoagulation 2/2 rectal bleed, CM,, CAD, HTN, legally blind, DM, HTN, HLD and morbid obesity who initially presented with n/v headaches and recent falls and found to have ICH. Developed aspiration pneumonia requiring intubation. Hospital course now complicated by continued encephalopathy with poor prognosis.   Goals of Care: Continues to be addressed with the family. Dr. Hilma Favors of palliative care feels that it is appropriate to focus on continued symptom management and comfort measures and that a PEG tube or tracheostomy would not provide any benefit or any quality of life.  Strongly recommends that DNR and comfort measures are more appropriate, and that  if family is not able to make a decision that [place]'s futility policy would be appropriate.  -Family meeting scheduled for Monday at [address] Dr. Philipp Ovens and Dr. Hilma Favors.   Encephalopathy:  Multifactorial due to respiratory failure, ICH, Uremia. Repeat CT (7/2) showed no acute changes. 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 No reversible causes have been identified. -SLP evaluated and recommends continued NPO due to strong immediate cough response to large thin bolus (tsp) -Neurowasconsulted for assessmentrecommends palliative care as they felt that patient would not recover to a point were he could be independent or able to manage his own affairs -Currently has mittens on to prevent him removing any equipment -No further changes, continues to be encephalopathic  T2DM: Patient developed some hypoglycemia this morning, CBGs got down to 68.  We will adjust his Levemir today.  -Decrease Levemir to 50 units twice daily -Continue sliding scale insulin  Acute Hypercarbic and Hypoxemic Respiratory Failure Aspiration PNA- finished antibiotics  Was Extubated on 6/24. Pulmonology has recommended against re-intubation.Continues to [place]. Due to CoreTrak BiPAP is unable to be used.Stable O2 sats will continue to monitor. Currently on 4 L/min HFNC. He has increased work of breathing and is at a high risk of developing respiratory distress.   AKI on CKD Stage IV: Per Nephrology consult during admission dialysis is not recommended.BUN and Cr continue to worsen.  -Continue monitoring Kidney function - Net I&Os yesterday is out 1425, continue torsemide 40 mg per tube  Hypernatremia:Resolved With CoreTrak in place he isgettingfree water per tube. -Na this  morning145 -No IV fluids currently -Free water per tube -Daily BMP  Hypertension: WithCoreTrakin place have switched from IV to PO medications for control. -StoppedIV meds -Resumed PO medsper  tube.pressurehave remainedstable will continue to monitor  Prior to Admission Living Arrangement: Home Anticipated Discharge Location: TBD Barriers to Discharge: Encephalopathy and goals of care discussion with family Dispo: Anticipated discharge pending ongoing goals of care conversation with family   Asencion Noble, MD 02/24/2020, 6:08 AM Pager: [phone] After 5pm on weekdays and 1pm on weekends: On Call pager [phone]

## 2020-02-25 ENCOUNTER — Inpatient Hospital Stay (HOSPITAL_COMMUNITY): Payer: Medicare (Managed Care)

## 2020-02-25 DIAGNOSIS — E119 Type 2 diabetes mellitus without complications: Secondary | ICD-10-CM

## 2020-02-25 DIAGNOSIS — I129 Hypertensive chronic kidney disease with stage 1 through stage 4 chronic kidney disease, or unspecified chronic kidney disease: Secondary | ICD-10-CM

## 2020-02-25 DIAGNOSIS — G9340 Encephalopathy, unspecified: Secondary | ICD-10-CM

## 2020-02-25 LAB — BASIC METABOLIC PANEL
Anion gap: 10 (ref 5–15)
BUN: 97 mg/dL — ABNORMAL HIGH (ref 8–23)
CO2: 29 mmol/L (ref 22–32)
Calcium: 9.4 mg/dL (ref 8.9–10.3)
Chloride: 107 mmol/L (ref 98–111)
Creatinine, Ser: 2.2 mg/dL — ABNORMAL HIGH (ref 0.61–1.24)
GFR calc Af Amer: 34 mL/min — ABNORMAL LOW (ref >60)
GFR calc non Af Amer: 29 mL/min — ABNORMAL LOW (ref >60)
Glucose, Bld: 106 mg/dL — ABNORMAL HIGH (ref 70–99)
Potassium: 4 mmol/L (ref 3.5–5.1)
Sodium: 146 mmol/L — ABNORMAL HIGH (ref 135–145)

## 2020-02-25 LAB — BLOOD GAS, ARTERIAL
Acid-Base Excess: 6.9 mmol/L — ABNORMAL HIGH (ref 0.0–2.0)
Bicarbonate: 31.5 mmol/L — ABNORMAL HIGH (ref 20.0–28.0)
Drawn by: 44898
FIO2: 36
O2 Saturation: 95.3 %
Patient temperature: 37
pCO2 arterial: 50.1 mmHg — ABNORMAL HIGH (ref 32.0–48.0)
pH, Arterial: 7.415 (ref 7.350–7.450)
pO2, Arterial: 72.7 mmHg — ABNORMAL LOW (ref 83.0–108.0)

## 2020-02-25 LAB — GLUCOSE, CAPILLARY
Glucose-Capillary: 100 mg/dL — ABNORMAL HIGH (ref 70–99)
Glucose-Capillary: 105 mg/dL — ABNORMAL HIGH (ref 70–99)
Glucose-Capillary: 123 mg/dL — ABNORMAL HIGH (ref 70–99)
Glucose-Capillary: 135 mg/dL — ABNORMAL HIGH (ref 70–99)
Glucose-Capillary: 152 mg/dL — ABNORMAL HIGH (ref 70–99)
Glucose-Capillary: 74 mg/dL (ref 70–99)

## 2020-02-25 NOTE — Evaluation (Signed)
Speech Language Pathology Evaluation Patient Details Name: [name] MRN: [id number] DOB: 1945/11/26 Today's Date: 02/25/2020 Time: 1027-2536 SLP Time Calculation (min) (ACUTE ONLY): 9 min  Problem List:  Patient Active Problem List   Diagnosis Date Noted  . Hypervolemia   . Acute respiratory failure ([place])   . Aspiration into airway   . Chronic respiratory failure with hypoxia and hypercapnia (HCC)   . Acute on chronic respiratory failure with hypoxia and hypercapnia (HCC)   . Goals of care, counseling/discussion   . Palliative care by specialist   . Acute diastolic CHF (congestive heart failure) ([place]) 04/28/2019  . Acute on chronic congestive heart failure ([place])   . Heart failure ([place]) 08/31/2018  . Paroxysmal atrial tachycardia ([place]) 05/12/2018  . Aortic valve stenosis, nonrheumatic 05/12/2018  . Diabetes mellitus type 2 in obese ([place]) 05/12/2018  . Chronic diastolic heart failure ([place]) 11/11/2017  . Hypernatremia 08/15/2017  . AKI (acute kidney injury) ([place]) 08/15/2017  . Acute lower UTI 08/15/2017  . Coarse tremors 08/15/2017  . Amiodarone pulmonary toxicity   . Cellulitis   . Encephalopathy   . Acute pulmonary edema (HCC)   . Acute respiratory failure with hypoxia ([place]) 06/13/2017  . SSS (sick sinus syndrome) ([place]) 05/19/2016  . Cough 08/20/2015  . Morbid obesity ([place]) 07/09/2015  . Cardiac mass 10/04/2013  . Intraventricular hemorrhage ([place]) 10/02/2013  . CVA (cerebral infarction) 10/01/2013  . History of intracranial hemorrhage 10/01/2013  . ICH (intracerebral hemorrhage) ([place]) 10/01/2013  . Right knee pain 09/07/2013  . Stage 3b chronic kidney disease 09/07/2013  . Orchitis 09/05/2013  . Unspecified protein-calorie malnutrition ([place]) 09/05/2013  . Sepsis ([place]) 08/27/2013  . Trichomonas infection 08/27/2013  . Epididymo-orchitis, acute 08/27/2013  . PAF (paroxysmal atrial fibrillation) ([place]) 06/24/2013  . OSA (obstructive sleep apnea) 05/04/2011  .  Shortness of breath 03/17/2011  . Depression 10/23/2010  . Degenerative joint disease   . Cardiomyopathy, hypertensive ([place])   . Coronary artery disease involving native coronary artery of native heart without angina pectoris   . Pacemaker   . Incarcerated ventral hernia   . COPD (chronic obstructive pulmonary disease) ([place]) 11/28/2009  . Diabetes mellitus due to underlying condition, controlled, with stage 3 chronic kidney disease, with long-term current use of insulin ([place]) 07/20/2006  . Hypercholesterolemia 07/20/2006  . GOUT 07/20/2006  . Macular degeneration (senile) of retina 07/20/2006  . Essential hypertension 07/20/2006  . BENIGN PROSTATIC HYPERTROPHY, HX OF 07/20/2006   Past Medical History:  Past Medical History:  Diagnosis Date  . Arthritis    "all over" (06/23/2013)  . Atrial fibrillation ([place])    not Candidate for Coumadin d/t rectal bleeding  . Benign prostatic hypertrophy   . Cardiomyopathy, hypertensive ([place])     diastolic dysfunction by 6440 echo  . Coronary artery disease     nonobstructive. 2 vessel. cardiac catheter in March 2005 showing 60% LAD occlusion, 30% circumflex occlusion.  . Degenerative joint disease     Right knee.  . Diabetes type 2, uncontrolled ([place])   . Diabetic peripheral neuropathy ([place])   . Diabetic ulcer of thigh ([place])    H/O Rt thigh ulcer.  . Exertional shortness of breath    "comes and goes" (06/23/2013)  . GERD (gastroesophageal reflux disease)   . Gout   . Herpes   . Hyperlipidemia   . Hypertension   . Hypotension 09/05/2013  . Incarcerated ventral hernia    H/O. S/P repair ventral hernia repair 07/2005.  Marland Kitchen LBBB (left bundle branch  block)   . Legally blind   . Morbid obesity ([place])   . Obstructive sleep apnea    On CPAP. Last sleep study (06/2009) - Severe obstructive sleep apnea/hypopnea syndrome, apnea-hypopnea index 70.5 per hour with non positional events moderately loud snoring and oxygen desaturation to a nadir of 85% on  room air.  . Occult blood positive stool     History of in August 2007.  colonoscopy in January 2008 showing normal colon with fair inadequate prep. By Dr. Silvano Rusk.  . Pacemaker 2003-11-28    secondary to bradycardia  . Sepsis ([place]) 08/26/2013   Past Surgical History:  Past Surgical History:  Procedure Laterality Date  . AMPUTATION Left 06/22/2013   Procedure: AMPUTATION FOOT;  Surgeon: Newt Minion, MD;  Location: [facility];  Service: Orthopedics;  Laterality: Left;  Left Midfoot Amputation  . CARDIAC CATHETERIZATION  10/22/2003   Dilatation of the LV with preserved LV systolic function. Tortous aorta, but no AAA. Moderate disease with 50-60% area of narrowing in the circumflex after the second OM and 70% mid narrowing in the LAD. No intervention.  Marland Kitchen CARDIOVASCULAR STRESS TEST  01/23/2010   Perfusion defect seen in inferior myocardial region is consistent with diaphragmatic attenuation. Remaining myocardium demonstrates normal myocardial perfusion with no evidence of ischemia or infarct. Stress ECG was non-diagnostic due to LBBB.  Marland Kitchen CATARACT EXTRACTION, BILATERAL Bilateral   . ESOPHAGOGASTRODUODENOSCOPY N/A 10/12/2013   Procedure: ESOPHAGOGASTRODUODENOSCOPY (EGD);  Surgeon: Gwenyth Ober, MD;  Location: [facility];  Service: General;  Laterality: N/A;  bedside  . EYE SURGERY    . 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 HERNIA REPAIR    . INSERT / REPLACE / REMOVE PACEMAKER  28-Nov-2003   Secondary to symptomatic bradycardia. DDDR pacemaker. By Dr. Rollene Fare.  Randolm Idol / REPLACE / REMOVE PACEMAKER  2012/11/27   "old pacemaker died; had new one put in" (July 15, 2013)  . Pacemaker lead revision  12/2003    likely microdislodgment  of right ventricular lead  . PEG PLACEMENT N/A 10/12/2013   Procedure: PERCUTANEOUS ENDOSCOPIC GASTROSTOMY (PEG) PLACEMENT;  Surgeon: Gwenyth Ober, MD;  Location: [facility];  Service: General;  Laterality: N/A;  . PERMANENT PACEMAKER  GENERATOR CHANGE N/A 11/03/2012   Procedure: PERMANENT PACEMAKER GENERATOR CHANGE;  Surgeon: Sanda Klein, MD;  Location: [facility] CATH LAB;  Service: Cardiovascular;  Laterality: N/A;  . REFRACTIVE SURGERY Bilateral   . RIGHT HEART CATH N/A 09/05/2018   Procedure: RIGHT HEART CATH;  Surgeon: Larey Dresser, MD;  Location: [facility] CV LAB;  Service: Cardiovascular;  Laterality: N/A;  . RIGHT/LEFT HEART CATH AND CORONARY ANGIOGRAPHY N/A 04/10/2019   Procedure: RIGHT/LEFT HEART CATH AND CORONARY ANGIOGRAPHY;  Surgeon: [name], MD;  Location: [facility];  Service: Cardiovascular;  Laterality: N/A;  . TEE WITHOUT CARDIOVERSION N/A 09/05/2018   Procedure: TRANSESOPHAGEAL ECHOCARDIOGRAM (TEE);  Surgeon: Larey Dresser, MD;  Location[facility] END[facility];  Service: Cardiovascular;  Laterality: N/A;  . TEE WITHOUT CARDIOVERSION N/A 05/08/2019   Procedure: TRANSESOPHAGEAL ECHOCARDIOGRAM (TEE);  Surgeon: [name], MD;  Location: [facility] CV LAB;  Service: Open Heart Surgery;  Laterality: N/A;  . TRANSCATHETER AORTIC VALVE REPLACEMENT, TRANSFEMORAL N/A 05/08/2019   Procedure: TRANSCATHETER AORTIC VALVE REPLACEMENT, TRANSFEMORAL;  Surgeon: [name], MD;  Location: [facility] CV LAB;  Service: Open Heart Surgery;  Laterality: N/A;  . TRANSTHORACIC ECHOCARDIOGRAM  01/01/2013   EF 50-55%, ventricular septum-thickness moderately increased  . VENOUS DOPPLER  01/17/2013   No evidence of thrombus or thrombophlebitis. Left GSVs appear enlarged and demonstrate valvular insuffiiciency. Bilateral SSVs appeared patent with no valvular insufficiency seen.  Marland Kitchen VENTRAL HERNIA REPAIR  07/2005    S/P laparotomy, lysis of adhesions, repair of incarcerated ventral hernia with  partial omentectomy.. Performed by Dr. Marlou Starks.   HPI:  Pt is a 74 year old with prior intracranial hemorrhage and blindness who presented with 3 days of headaches Head CT increased but still small volume intraventricular hemorrhage  layering in the occipital horns of the lateral ventricles." Unable to perform MRI. BSE performed 10/04/19 recommended NPO, 01/27/20 NPO and unable to initiate po's due to respiratory status with decline and intubated 6/17-6/24, BiPAP 6/25 and discontinued during the day. Cortak disloged 7/5 and pulled 7/6. Per MD notes "pt's encephalopathy is improving somewhat but overall prognosis remains very poor".   Assessment / Plan / Recommendation Clinical Impression  Pt is alert today but not following one-step commands. His verbal expression is very limited, occasionally saying "oh boy" as an automatic response. He did not respond to direct questioning, whether the questions were open-ended or in yes/no format. During PO trials he replied "yes" several times when asked if he wanted water (including one time when he said "yes I do" - the longest utterance he produced overall). He could not make selections from binary choices or communicate his preferences though despite Max cues from SLP. Note that palliative care meeting is planned for this afternoon - will f/u as indicated to facilitate communication as able.    SLP Assessment  SLP Recommendation/Assessment: Patient needs continued Speech Lanaguage Pathology Services SLP Visit Diagnosis: Cognitive communication deficit (R41.841)    Follow Up Recommendations  Skilled Nursing facility    Frequency and Duration min 2x/week  2 weeks      SLP Evaluation Cognition  Overall Cognitive Status: No family/caregiver present to determine baseline cognitive functioning Arousal/Alertness: Awake/alert Orientation Level: Disoriented X4 Attention: Sustained Sustained Attention: Impaired Sustained Attention Impairment: Functional basic Problem Solving: Impaired Problem Solving Impairment: Functional basic Safety/Judgment: Impaired       Comprehension  Auditory Comprehension Overall Auditory Comprehension: Impaired Yes/No Questions: Impaired Basic Biographical  Questions: 0-25% accurate Commands: Impaired One Step Basic Commands: 0-24% accurate    Expression Expression Primary Mode of Expression: Verbal Verbal Expression Overall Verbal Expression: Impaired Initiation: Impaired Automatic Speech:  (none of the above) Level of Generative/Spontaneous Verbalization: Word;Phrase Repetition: Impaired Level of Impairment: Word level Non-Verbal Means of Communication: Not applicable   Oral / Motor  Oral Motor/Sensory Function Overall Oral Motor/Sensory Function:  (not following commands to evaluate) Motor Speech Overall Motor Speech: Appears within functional limits for tasks assessed (across very limited verbal output)   GO                    Osie Bond., M.A. [facility] Pager [phone] Office [phone]  02/25/2020, 2:46 PM

## 2020-02-25 NOTE — Progress Notes (Signed)
°  Speech Language Pathology Treatment: Dysphagia  Patient Details Name: [name] MRN: [id number] DOB: 1946/01/05 Today's Date: 02/25/2020 Time: 7169-6789 SLP Time Calculation (min) (ACUTE ONLY): 8 min  Assessment / Plan / Recommendation Clinical Impression  Pt is alert and has improving automaticity with POs offered this afternoon, demonstrating swift initiation of bolus manipulation once in his oral cavity. He has more difficulty with initial bolus acceptance, biting down on anything put into his mouth (swab, spoon, straw). He can get boluses off of a spoon or siphoned via straw with assistance from SLP. He does not have overt coughing today, but there is occasional throat clearing and second swallow. His oral cavity is dry and coated - suspect that oral care is limited by his mentation. Recommend focusing on oral care and offering a few single pieces of ice immediately following as alertness and acceptance allow. Also note that he has a [place] meeting planned for later today. If in line with overall GOC moving forward, will f/u for potential to participate in MBS if his bolus acceptance and mentation improve.    HPI HPI: Pt is a 74 year old with prior intracranial hemorrhage and blindness who presented with 3 days of headaches Head CT increased but still small volume intraventricular hemorrhage layering in the occipital horns of the lateral ventricles." Unable to perform MRI. BSE performed 10/04/19 recommended NPO, 01/27/20 NPO and unable to initiate po's due to respiratory status with decline and intubated 6/17-6/24, BiPAP 6/25 and discontinued during the day. Cortak disloged 7/5 and pulled 7/6. Per MD notes "pt's encephalopathy is improving somewhat but overall prognosis remains very poor".      SLP Plan  Continue with current plan of care       Recommendations  Diet recommendations: NPO;Other(comment) (few pieces of ice after oral care) Medication Administration: Via alternative means                 Oral Care Recommendations: Oral care QID Follow up Recommendations: Skilled Nursing facility SLP Visit Diagnosis: Dysphagia, unspecified (R13.10) Plan: Continue with current plan of care       GO                Osie Bond., M.A. [facility] Pager [phone] Office [phone]  02/25/2020, 2:37 PM

## 2020-02-25 NOTE — Progress Notes (Signed)
Placed patient on BIPAP for HS use. No distress noted. Core Trak in place but feeding off at this time.

## 2020-02-25 NOTE — Progress Notes (Addendum)
NAME:  [name], MRN:  [id number], DOB:  13-Jul-1946, LOS: 60 ADMISSION DATE:  01/25/2020, CONSULTATION DATE:  01/31/2020 REFERRING MD:  Graciella Freer, CHIEF COMPLAINT:  Worsening AMS/ hypoxia  Brief History    74 year old male with prior extensive medical history significant for blindness, prior ICH with previous trach/ PEG with some baseline aphasia, HFpEF, AS s/p TAVR 04/2019, afib not on AC due to GI bleeding and ICH, CAD, OSA, CKD stage IV, HTN, morbid obesity, and poorly controlled DMT2 with neuropathy who presented 6/11 with 3 day history of bifrontal headache, recent falls 2-3 weeks prior, increasing confusion/ syncope and nausea/ vomiting.  Found on workup to have small bilateral intraventricular hemorrhage of unclear etiology, suspected to be traumatic given fall history vs spontaneous.   MRI unable to be obtained given pacemaker.  Admitted to IMTS with neurology consulting.  Required intermittent cleviprex gtt for strict BP control. Hospitalization complicated by suspected aspiration pneumonia on 6/13 and started empirically on antibiotics, and worsening AKI on CKD, and progressive decline in mental status.  Serial imaging mostly stable.  Palliative care consulted to help with goals of care and Overnight, 6/16, he had worsening mental status , increased O2 needs, and desaturations. ABG obtained showed 7.306/ 68/ 72/ 33.5.   PCCM consulted for further management of respiratory failure.   Past Medical History  Blindness, OSA, prior ICH with previous trach/ PEG, HFpEF, afib, AS s/p TAVR 04/2019, CAD, CKD stage IV, HTN, DMT2 poorly controlled with neuropathy, morbid obesity, HLD, GERD, bradycardia/ LBBB with pacemaker,   Significant Hospital Events   6/11 admitted IMTS to ICU, intermittently requiring cleviprex 6/13 concern for aspiration, abx started 6/15 requiring cleviprex gtt 6/17 worsening mental status/ hypoxia- intubated 6/24 extubated to BiPAP 6/25 started on dexmet overnight for reported  agitation. Remains on BiPAP. Discontinuing BiPAP during day shift  7/2 transferred back to ICU for persistent respiratory decline with inability to liberate from BiPAP 7/3 - Seen lying in bed on BiPAP, will open eyes to verbal stimuli but unable to follow any commands  Consults:  Neurology 6/11 Palliative care 6/15 PCCM 6/17  Procedures:  6/14 right nare cortrak >> 6/17 ETT >> 6/17 bronchoscopy-> RML mucous plug   Significant Diagnostic Tests:  6/11 CTH >> There is hemorrhage in the dependent portion of each lateral ventricle of uncertain etiology. No brain parenchymal hemorrhage elsewhere noted. No mass. No extra-axial fluid.  There is mild atrophy with extensive periventricular small vessel disease. No acute appearing infarct is evident.  There are multiple foci of arterial vascular calcification. There is mucosal thickening in several ethmoid air cells.  6/11 RUQ limited US >> Gallbladder sludge and gallstones without complicating factors.  Right renal cysts stable from prior CT examination.  6/12 CTH >> unchanged [name] layering within the occipital horns of the lateral ventricles  6/13 CTH >> 1. Increased but still small volume intraventricular hemorrhage layering in the occipital horns of the lateral ventricles. 2. Borderline increase in ventriculomegaly, recommend follow-up.  6/13 TTE >> LVEF 60-65%, mod LVH, no wall motion abnormality, [id number], RV systolic function normal, mild LA dilation, post TAVR, mild dilation of aortic root  6/14 CTH >>  Stable hemorrhage layering in each occipital horn of the lateral ventricles, stable. No hemorrhage appreciable. There is atrophy with extensive supratentorial small vessel disease, stable. No acute infarct evident. There is extensive arterial vascular calcification at multiple sites. There is mucosal thickening in multiple ethmoid air cells.  6/16 CTH >> Stable intraventricular hemorrhage. Small amount  of subarachnoid hemorrhage on the  left also stable. No new hemorrhage or infarct Mild ventricular enlargement is stable  6/17 CTH >>  Stable exam with small amount of bilateral intraventricular hemorrhage and subarachnoid overlying L parietal lobe.  No new hemorrhage . THS 1`.184  6/17 Renal US: no hydronephrosis. Increased cortical echogenicity of bilateral kidneys. Bilateral renal cysts. L cyst arising form inferior pole and complicated of internal areas with increased echogenicity   6/23: CXR BL congestion   Chest CT 7/1 > Dependent atelectasis and trace pleural effusions.  Head CT 7/2 > Evidence of advanced chronic small vessel disease but no CT findings of acute or evolving anoxic injury. Trace residual intraventricular and subarachnoid hemorrhage,   Micro Data:  6/11 SARS 2 >> neg 6/15 MRSA PCR >> neg 6/15 BCx2 >> ngtd >> 6/17 BAL  >> nml flora  Antimicrobials:  6/13 azithro >> 6/14 6/13 ceftriaxone >> 6/14 6/14 unasyn >> 6/16 6/17 vanc >>6/18 6/17 cefepime >> 6/17 flagyl >>6/18  Interim history/subjective:   02/25/2020 - CCM recalled back due to bipap dependence past 1-2 days.  PRior to that [name] in day and night Night Bipap Currently back on [name]. Per RN - similar mental status all week. Does wake up and moan to voice and name call. Occ mouths his name. Arm movement seen. On and off abd paradoxus present per RN. PEr IMTS  - family difficult to get hold off and so goals of care not clear and will not decide  Per neuro note Dr Leonie Man 02/20/20 - >".  He has not made much neurological improvement over the last 1 month which is a poor prognostic sign and I doubt patient will recover to the point that he can be independent and manages own affairs". I think a palliative care approach would be quite appropriate.  He will likely need a PEG tube for nutritional need"   Objective   Blood pressure (!) 109/59, pulse 65, temperature 98.5 F (36.9 C), temperature source Axillary, resp. rate (!) 27, height _0  (1.753 m),  weight (!) 140.8 kg, SpO2 90 %.    Vent Mode: BIPAP FiO2 (%):  [40 %] 40 % Set Rate:  [10 bmp] 10 bmp PEEP:  [7 cmH20] 7 cmH20   Intake/Output Summary (Last 24 hours) at 02/25/2020 1308 Last data filed at 02/25/2020 1100 Gross per 24 hour  Intake 480 ml  Output 2400 ml  Net -1920 ml   Filed Weights   02/22/20 0500 02/23/20 0439 02/24/20 0500  Weight: (!) 141.6 kg (!) 139.7 kg (!) 140.8 kg    Examination: General Appearance:  Obese Head:  Normocephalic, without obvious abnormality, atraumatic Eyes:  PERRL - x, conjunctiva/corneas - muddy     Ears:  Normal external ear canals, both ears Nose:  G tube - has left pand Throat:  ETT TUBE - n , OG tube - no Neck:  Supple,  No enlargement/tenderness/nodules Lungs: Clear to auscultation bilaterally,  Heart:  S1 and S2 normal, no murmur, CVP - x.  Pressors - n Abdomen:  Soft, no masses, no organomegaly Genitalia / Rectal:  Not done Extremities:  Extremities- intact Skin:  ntact in exposed areas . Sacral area - x Neurologic:  Sedation - none -> RASS - 0 . Moves all 4s - not clear. CAM-ICU - x . Orientation - no.. Not following commands but moans    LABS    PULMONARY No results for input(s): PHART, PCO2ART, PO2ART, HCO3, TCO2, O2SAT in the last 168  hours.  Invalid input(s): PCO2, PO2  CBC Recent Labs  Lab 02/19/20 1056 02/22/20 0804  HGB 10.4* 10.2*  HCT 36.8* 35.7*  WBC 10.5 13.6*  PLT 154 135*    COAGULATION No results for input(s): INR in the last 168 hours.  CARDIAC  No results for input(s): TROPONINI in the last 168 hours. No results for input(s): PROBNP in the last 168 hours.   CHEMISTRY Recent Labs  Lab 02/20/20 1432 02/20/20 1432 02/21/20 0416 02/21/20 0416 02/22/20 0600 02/22/20 0600 02/23/20 0240 02/25/20 0432  NA 146*  --  145  --  142  --  144 146*  K 3.5   < > 3.6   < > 4.1   < > 4.1 4.0  CL 109  --  107  --  105  --  106 107  CO2 28  --  27  --  27  --  27 29  GLUCOSE 100*  --  101*  --   133*  --  113* 106*  BUN 66*  --  63*  --  74*  --  88* 97*  CREATININE 1.90*  --  1.91*  --  2.17*  --  2.39* 2.20*  CALCIUM 9.4  --  9.5  --  9.5  --  9.5 9.4   < > = values in this interval not displayed.   Estimated Creatinine Clearance: 43 mL/min (A) (by C-G formula based on SCr of 2.2 mg/dL (H)).   LIVER No results for input(s): AST, ALT, ALKPHOS, BILITOT, PROT, ALBUMIN, INR in the last 168 hours.   INFECTIOUS No results for input(s): LATICACIDVEN, PROCALCITON in the last 168 hours.   ENDOCRINE CBG (last 3)  Recent Labs    02/25/20 0415 02/25/20 0659 02/25/20 1132  GLUCAP 100* 74 152*         IMAGING x48h  - image(s) personally visualized  -   highlighted in bold No results found.    Resolved Hospital Problem list   Likely aspiration PNA  -has completed course of antibiotics.  Assessment & Plan:   Acute on chronic hypercarbic acute hypoxic respiratory failure now status post intubation. Acute encephalopathy  02/25/2020 =- currently holding on [place]. Unclear reasns for short term decline but ddx includes HAP, volume overload, slow worsening hypercapnia in setting of chronic medical issues and CNS issues   P:  Short term - look for reversible causes for recent decline -> check ABG, cxr. Continue diuresis if indicated  Regarding long term -> if full code this patient needs tracheostomy to protect airway given mental status changes. However, if family hard to reach or wont commit to any goal -> No intubationa nd no trach is appropriate because of very poor prognosis regarding mental status -> but this would require multiple care providers agreeing with support of palliative care   D.w resident     SIGNATURE    Dr. Brand Males, M.D., F.C.C.P,  Pulmonary and Critical Care Medicine Staff Physician, [place] Director - Interstitial Lung Disease  Program  Pulmonary [place] at [place], [place], [address]  Pager: [phone], If no answer or between  15:00h - 7:00h: call [phone] Telephone: [phone]  2:51 PM 02/25/2020

## 2020-02-25 NOTE — Progress Notes (Addendum)
Subjective: Interviewed patient at bedside. Patient was unresponsive to verbal commands and was unresponsive to painful stimuli (sternal rub). Patient was on BiPAP.  Objective:  Vital signs in last 24 hours: Vitals:   02/25/20 0100 02/25/20 0322 02/25/20 0400 02/25/20 0519  BP: (!) 114/56 (!) 115/51 134/72 (!) 109/50  Pulse:  63 (!) 58   Resp:   (!) 22   Temp:   97.7 F (36.5 C)   TempSrc:   Axillary   SpO2:  100% 100%   Weight:      Height:       Physical Exam Vitals and nursing note reviewed.  Constitutional:      Appearance: He is obese.  HENT:     Head: Normocephalic and atraumatic.  Cardiovascular:     Rate and Rhythm: Normal rate and regular rhythm.     Pulses: Normal pulses.          Radial pulses are 2+ on the right side and 2+ on the left side.       Dorsalis pedis pulses are 2+ on the right side and 2+ on the left side.     Heart sounds: Normal heart sounds, S1 normal and S2 normal. No murmur heard.   Pulmonary:     Effort: Tachypnea present. No respiratory distress.     Breath sounds: No wheezing.  Abdominal:     Palpations: There is no mass.     Tenderness: There is no abdominal tenderness.  Neurological:     Mental Status: He is unresponsive.      Assessment/Plan:  Principal Problem:   ICH (intracerebral hemorrhage) (HCC) Active Problems:   Intraventricular hemorrhage (HCC)   Encephalopathy   Goals of care, counseling/discussion   Palliative care by specialist   Chronic respiratory failure with hypoxia and hypercapnia (HCC)   Acute on chronic respiratory failure with hypoxia and hypercapnia (HCC)   Aspiration into airway   Acute respiratory failure (HCC)   Hypervolemia  Goals of Care: Continues to be addressed with the family. Dr. Hilma Favors of palliative care feels that it is appropriate to focus on continued symptom management and comfort measures and that a PEG tube or tracheostomy would not provide any benefit or any quality of life.   Strongly recommends that DNR and comfort measures are more appropriate, and that if family is not able to make a decision that [place] futility policy would be appropriate.  -Family meeting scheduled for Monday at [facility] Dr. Hilma Favors.   Encephalopathy: Multifactorial due to respiratory failure, ICH, Uremia. Repeat CT (7/2) showed no acute changes. 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 No reversible causes have been identified. -SLP evaluated and recommends continued NPO due to strong immediate cough response to large thin bolus (tsp) -Neurowasconsulted for assessmentrecommends palliative care as they felt that patient would not recover to a point were he could be independent or able to manage his own affairs -continues to be encephalopathic -unresponsive to painful stimuli (sternal rub)   Acute Hypercarbic and Hypoxemic Respiratory Failure Aspiration PNA- finished antibiotics  Was Extubated on 6/24. Pulmonology has recommended against re-intubation.Has been placed on BiPAP again as patient can no longer be sustained on nasal canula -Continue to monitor and reassess     AKI on CKD Stage IV: Per Nephrology consult during admission dialysis is not recommended.BUN and Cr continue to worsen.  -Continue monitoring Kidney function -NetI&Os yesterday is out 1750, continuetorsemide 40 mg per tube   Hypernatremia resolved: With CoreTrak in place has  been getting free water -Continue to monitor   Hypertension: [place] place have switched from IV to PO medications for control. -StoppedIV meds -Resumed PO medsper tube.pressurehave remainedstable will continue to monitor   T2DM: Glucose levelshave been stable overnight -Continue sliding scale for glucose control    Prior to Admission Living Arrangement: Home Anticipated Discharge Location: TBD Barriers to Discharge: multiple medical condions Dispo: Anticipated discharge pending ongoing goals of care  conversation with family   Briant Cedar, MD 02/25/2020, 5:53 AM Pager: [phone] After 5pm on weekdays and 1pm on weekends: On Call pager [phone]

## 2020-02-26 LAB — CBC
HCT: 34.9 % — ABNORMAL LOW (ref 39.0–52.0)
Hemoglobin: 9.8 g/dL — ABNORMAL LOW (ref 13.0–17.0)
MCH: 24.4 pg — ABNORMAL LOW (ref 26.0–34.0)
MCHC: 28.1 g/dL — ABNORMAL LOW (ref 30.0–36.0)
MCV: 87 fL (ref 80.0–100.0)
Platelets: 115 10*3/uL — ABNORMAL LOW (ref 150–400)
RBC: 4.01 MIL/uL — ABNORMAL LOW (ref 4.22–5.81)
RDW: 18.7 % — ABNORMAL HIGH (ref 11.5–15.5)
WBC: 10.1 10*3/uL (ref 4.0–10.5)
nRBC: 0.2 % (ref 0.0–0.2)

## 2020-02-26 LAB — GLUCOSE, CAPILLARY
Glucose-Capillary: 101 mg/dL — ABNORMAL HIGH (ref 70–99)
Glucose-Capillary: 124 mg/dL — ABNORMAL HIGH (ref 70–99)
Glucose-Capillary: 159 mg/dL — ABNORMAL HIGH (ref 70–99)
Glucose-Capillary: 63 mg/dL — ABNORMAL LOW (ref 70–99)
Glucose-Capillary: 68 mg/dL — ABNORMAL LOW (ref 70–99)
Glucose-Capillary: 84 mg/dL (ref 70–99)
Glucose-Capillary: 93 mg/dL (ref 70–99)
Glucose-Capillary: 98 mg/dL (ref 70–99)

## 2020-02-26 MED ORDER — DEXTROSE 50 % IV SOLN
INTRAVENOUS | Status: AC
  Administered 2020-02-26: 25 mL
  Filled 2020-02-26: qty 50

## 2020-02-26 NOTE — Progress Notes (Signed)
   Pall care notes reviewed - ccm will sign off for now. Call if needed    SIGNATURE    Dr. Brand Males, M.D., F.C.C.P,  Pulmonary and Critical Care Medicine Staff Physician, [place] Director - Interstitial Lung Disease  Program  Pulmonary [place] [facility] [place], [place], [address]  Pager: [phone], If no answer or between  15:00h - 7:00h: call [phone] Telephone: [phone]  9:22 AM 02/26/2020

## 2020-02-26 NOTE — Progress Notes (Signed)
Subjective: Interviewed patient at bedside. Patient was unresponsive to questioning.   Objective:  Vital signs in last 24 hours: Vitals:   02/26/20 0337 02/26/20 0411 02/26/20 0500 02/26/20 0544  BP: (!) 143/74 (!) 145/85  (!) 149/90  Pulse: 64 64    Resp: 20     Temp:  98.5 F (36.9 C)    TempSrc:  Oral    SpO2: 96%     Weight:   (!) 148.8 kg   Height:       Physical Exam Vitals and nursing note reviewed.  Constitutional:      General: He is not in acute distress.    Appearance: He is obese. He is not ill-appearing or toxic-appearing.  Cardiovascular:     Rate and Rhythm: Normal rate and regular rhythm.     Pulses: Normal pulses.          Radial pulses are 2+ on the right side and 2+ on the left side.       Dorsalis pedis pulses are 2+ on the right side and 2+ on the left side.     Heart sounds: Normal heart sounds, S1 normal and S2 normal. No murmur heard.   Pulmonary:     Effort: No accessory muscle usage or respiratory distress.  Abdominal:     Palpations: There is no mass.     Tenderness: There is no abdominal tenderness.  Neurological:     Mental Status: He is unresponsive.       Assessment/Plan:  Principal Problem:   ICH (intracerebral hemorrhage) (HCC) Active Problems:   Intraventricular hemorrhage (HCC)   Encephalopathy   Goals of care, counseling/discussion   Palliative care by specialist   Chronic respiratory failure with hypoxia and hypercapnia (HCC)   Acute on chronic respiratory failure with hypoxia and hypercapnia (HCC)   Aspiration into airway   Acute respiratory failure (HCC)   Hypervolemia  Goals of Care: Continues to be addressed with the family.Dr. Hilma Favors of palliative care feels that it is appropriate to focus on continued symptom management and comfort measures. Strongly recommends that DNR and DNI be the patients status. Awaiting families decision today pending our meeting yesterday night. Discussed PEG tube placement, which family  has stated they want -Await family decision about CODE status -Consult for PEG tube placement   Encephalopathy: Multifactorial due to respiratory failure, ICH, Uremia. Repeat CT (7/2) showed no acute changes. 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 No reversible causes have been identified. -SLP evaluated and recommends focusing on oral care and offering single ice chips at a time as alertness allows -Neurowasconsulted for assessmentrecommends palliative care as they felt that patient would not recover to a point were he could be independent or able to manage his own affairs -continues to be encephalopathic   Acute Hypercarbic and Hypoxemic Respiratory Failure Aspiration PNA- finished antibiotics  Was Extubated on 6/24. Pulmonology has recommended against re-intubation.Has been placed on BiPAP QHS and on nasal canula during the day. -Continue to monitor and reassess     AKI on CKD Stage IV: Per Nephrology consult during admission dialysis is not recommended.BUN and Cr continue to worsen.  -Continue monitoring Kidney function -NetI&Os yesterday is out 2891, continuetorsemide 40 mg per tube   Hypernatremia resolved: With CoreTrak in place has been getting free water -Continue to monitoryesterday Na was 146   Hypertension: [place] place have switched from IV to PO medications for control. -StoppedIV meds -Resumed PO medsper tube.pressurehave remainedstable will continue to monitor  T2DM: Glucose levelshavebeen stable overnight -Continue sliding scale for glucose control   Prior to Admission Living Arrangement: Home Anticipated Discharge Location: TBD Barriers to Discharge: multiple medical condions Dispo: Anticipated discharge pending ongoing goals of care conversation with family   Briant Cedar, MD 02/26/2020, 5:45 AM Pager: [phone] After 5pm on weekdays and 1pm on weekends: On Call pager [phone]

## 2020-02-26 NOTE — Progress Notes (Signed)
Palliative Care Family Meeting  Met with patient's family this evening including his daughter, sister, granddaughter in person and another son and daughter by speaker phone to discuss goals of care. Mr. Azucena continues to have significant neurological impairments but was more responsive this evening to his family- he was able to answer some basic yes no questions from his sister and tell her he loved her. He requires HS bipap and has a cortrak feeding tube for nutrition. His baseline health is extremely poor and in addition to his stroke and brain bleed he has significant cardiac and pulmonary issues.  Summary of Meeting.Goals:  1. Family desire methodical process to decisionn making and want to gather all their facts and double check medical issues using online resources which have been helpful to them- he is very complex and they acknowledge struggling to understand the big picture.   2. Extensive discussion around goals of care- family able to state they would draw the line if he needed re-intubation and they would never desire a trach. They are strongly considering DNR and will likely move in that direction tomorrow. We briefly addressed his AICD.  3. They have been offered a PEG and while I shared with them my reservations, they are not ready to transition to full comfort care we should proceed with this and I established some expectations about stopping artifical feeding if he has further aspiration or complications. The PEG can also serve the purpose of providing medications for comfort in the future.  4. They continue to want medical treatment for reversible illness.  5. Bipap at night only- hx of obesity and Sleep Apnea/OHS.   Will follow up tomorrow.  Lane Hacker, DO Palliative Medicine   Time: 120 minutes Greater than 50%  of this time was spent counseling and coordinating care related to the above assessment and plan.

## 2020-02-27 ENCOUNTER — Inpatient Hospital Stay (HOSPITAL_COMMUNITY): Payer: Medicare (Managed Care)

## 2020-02-27 LAB — GLUCOSE, CAPILLARY
Glucose-Capillary: 112 mg/dL — ABNORMAL HIGH (ref 70–99)
Glucose-Capillary: 120 mg/dL — ABNORMAL HIGH (ref 70–99)
Glucose-Capillary: 120 mg/dL — ABNORMAL HIGH (ref 70–99)
Glucose-Capillary: 122 mg/dL — ABNORMAL HIGH (ref 70–99)
Glucose-Capillary: 137 mg/dL — ABNORMAL HIGH (ref 70–99)
Glucose-Capillary: 151 mg/dL — ABNORMAL HIGH (ref 70–99)
Glucose-Capillary: 166 mg/dL — ABNORMAL HIGH (ref 70–99)

## 2020-02-27 NOTE — Progress Notes (Signed)
Palliative Care Follow-up Note Reason: Goals of Care  Contacted daughter this PM regarding our goals of care discussion yesterday. She reports "some of [place] are still grappling with the information", she tells me she will will reach out to me and I have provided her with my direct information. Given the difficult road ahead for Mr. Armitage I recommend the following:  1. Place PEG tube and remove Cortrak- transition him off IV meds to per tube medications. Remove his mitts and progress him to the degree possible in his condition.  2. I have recommended to family that we focus on one key decision at a time- they were very clear that they would not want him placed back on a ventilator or to have a tracheostomy placed- so it is reasonable to place a LIMITED code order that clarifies this-I will discuss this with them and let them know I am placing that order to protect him in the event he has a respiratory arrest and reassure them that supplemental oxygen will be provided and since he has been on PM bipap for Sleep Apnea/OHS this can be continued as long as he tolerates this.  3. I think another barrier to decision making is worsened by visitor restriction- family has not really been able to be at the bedside and see how bad things really are and his daughter has to translate information and admittedly she says this is difficult for her and she feels pressure. I believe we should request accomodation for visitors to rotate in and out to see him-at least two at a time. 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  4. I shared with them I believe hs current neurological condition will not improve-at best he will need a skilled nursing facility for the rest of his life, unless family are available to provide 24/7 in home care for him.He will continue to have issues like aspiration PNA, skin breakdown, infections  and if he survives to discharge he may be looking at frequent hospitalizations and difficulty managing all of his end-stage medical problems- but it is difficult for people to understand the burden and impact of this until they actually experience it. For now we should try to progress him out of the hospital to the next level of care until there is a defining event or marked decline.  5. Will complete a goals of care document in [facility] to outline the stated preferences by family.  Lane Hacker, DO Palliative Medicine

## 2020-02-27 NOTE — Progress Notes (Signed)
RT arrived to patients room and noticed patient had hand mitts on while on the bipap. RT talked with [facility] and per MD patient is okay to wear hand mitts while on bipap. RT will continue to monitor.

## 2020-02-27 NOTE — Progress Notes (Signed)
Subjective: Interviewed patient bedside. Patient was unresponsive to verbal stimuli, was only minimally responsive to painful stimuli (sternal rub).  Objective:  Vital signs in last 24 hours: Vitals:   02/27/20 0355 02/27/20 0400 02/27/20 0500 02/27/20 0518  BP: (!) 143/69   (!) 156/76  Pulse: 71     Resp: 17 18    Temp:  98.8 F (37.1 C)    TempSrc:  Axillary    SpO2: 98%     Weight:   (!) 148.8 kg   Height:       Physical Exam Vitals and nursing note reviewed.  Constitutional:      General: He is not in acute distress.    Appearance: He is obese.  HENT:     Head: Normocephalic and atraumatic.  Cardiovascular:     Rate and Rhythm: Normal rate and regular rhythm.     Pulses: Normal pulses.          Radial pulses are 2+ on the right side and 2+ on the left side.       Dorsalis pedis pulses are 2+ on the right side and 2+ on the left side.     Heart sounds: Normal heart sounds, S1 normal and S2 normal. No murmur heard.   Pulmonary:     Effort: No respiratory distress.  Abdominal:     Palpations: There is no mass.     Tenderness: There is no abdominal tenderness.  Musculoskeletal:     Right lower leg: 1+ Pitting Edema present.     Left lower leg: 1+ Pitting Edema present.  Neurological:     Mental Status: He is lethargic, disoriented and confused.      Assessment/Plan:  Principal Problem:   ICH (intracerebral hemorrhage) (HCC) Active Problems:   Intraventricular hemorrhage (HCC)   Encephalopathy   Goals of care, counseling/discussion   Palliative care by specialist   Chronic respiratory failure with hypoxia and hypercapnia (HCC)   Acute on chronic respiratory failure with hypoxia and hypercapnia (HCC)   Aspiration into airway   Acute respiratory failure (HCC)   Hypervolemia  Goals of Care: Continues to be addressed with the family.Dr. Hilma Favors of palliative care feels that it is appropriate to focus on continued symptom management and comfort measures.  Strongly recommends that DNR and DNI be the patients status. Dr. Hilma Favors discussed with family yesterday and will proceed with making patient partial code as they do not want him on ventilator. Reinforced that nasal canula and BiPAP will still be used to supplement O2 as he needs. Discussed PEG tube placement, which family has stated they want and  -Await family decision about DNR status -Consult for PEG tube placement   Encephalopathy: Multifactorial due to respiratory failure, ICH, Uremia. Repeat CT (7/2) showed no acute changes. 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 No reversible causes have been identified. -SLP evaluated and recommends focusing on oral care and offering single ice chips at a time as alertness allows -Neurowasconsulted for assessmentrecommends palliative care as they felt that patient would not recover to a point were he could be independent or able to manage his own affairs -continues to be encephalopathic -Only minimal response to painful stimuli (sternal rub)   Acute Hypercarbic and Hypoxemic Respiratory Failure Aspiration PNA- finished antibiotics Was Extubated on 6/24. Pulmonology has recommended against re-intubation.Has been placed on BiPAP QHS and on nasal canula during the day. -Continue to monitor and reassess   AKI on CKD Stage IV: Per Nephrology consult during admission  dialysis is not recommended.BUN and Cr continue to worsen.  -Continue monitoring Kidney function -NetI&Os yesterday is out 1400, continuetorsemide 40 mg per tube   Hypernatremiaresolved:With CoreTrak in place has been getting free water -Continue to monitoryesterday Na was 146   Hypertension: [place] place have switched from IV to PO medications for control. -StoppedIV meds -Resumed PO medsper tube.pressurehave remainedstable will continue to monitor   T2DM: Glucose levelshavebeenstable overnight -Continue sliding scale for  glucose control   Prior to Admission Living Arrangement:Home Anticipated Discharge Location:TBD Barriers to Discharge:multiple medical condions Dispo: Anticipated dischargepending ongoing goals of care conversation with family   Briant Cedar, MD 02/27/2020, 6:05 AM Pager: [phone] After 5pm on weekdays and 1pm on weekends: On Call pager [phone]

## 2020-02-28 LAB — GLUCOSE, CAPILLARY
Glucose-Capillary: 108 mg/dL — ABNORMAL HIGH (ref 70–99)
Glucose-Capillary: 137 mg/dL — ABNORMAL HIGH (ref 70–99)
Glucose-Capillary: 137 mg/dL — ABNORMAL HIGH (ref 70–99)
Glucose-Capillary: 156 mg/dL — ABNORMAL HIGH (ref 70–99)
Glucose-Capillary: 62 mg/dL — ABNORMAL LOW (ref 70–99)
Glucose-Capillary: 89 mg/dL (ref 70–99)
Glucose-Capillary: 98 mg/dL (ref 70–99)

## 2020-02-28 MED ORDER — GLUCERNA 1.2 CAL PO LIQD
1000.0000 mL | ORAL | Status: DC
  Administered 2020-03-02 – 2020-03-10 (×6): 1000 mL
  Filled 2020-02-28 (×18): qty 1000

## 2020-02-28 MED ORDER — DEXTROSE 5 % IV SOLN
3.0000 g | INTRAVENOUS | Status: AC
  Filled 2020-02-28: qty 3000

## 2020-02-28 MED ORDER — HEPARIN SODIUM (PORCINE) 5000 UNIT/ML IJ SOLN
5000.0000 [IU] | Freq: Three times a day (TID) | INTRAMUSCULAR | Status: AC
  Administered 2020-02-29 – 2020-03-02 (×7): 5000 [IU] via SUBCUTANEOUS
  Filled 2020-02-28 (×7): qty 1

## 2020-02-28 MED ORDER — DEXTROSE 50 % IV SOLN
INTRAVENOUS | Status: AC
  Administered 2020-02-28: 50 mL
  Filled 2020-02-28: qty 50

## 2020-02-28 MED ORDER — PROSOURCE TF PO LIQD
45.0000 mL | Freq: Three times a day (TID) | ORAL | Status: DC
  Administered 2020-02-28 – 2020-03-10 (×31): 45 mL
  Filled 2020-02-28 (×34): qty 45

## 2020-02-28 NOTE — Progress Notes (Addendum)
Subjective: Interviewed patient bedside. When asked how he was doing he replied with "Not to bad." He then did not respond to further prompts.  Objective:  Vital signs in last 24 hours: Vitals:   02/28/20 0000 02/28/20 0200 02/28/20 0358 02/28/20 0500  BP: (!) 157/92 140/73 140/73   Pulse: 65 64 64   Resp: 17 16 18    Temp:   98.8 F (37.1 C)   TempSrc:   Axillary   SpO2:   95%   Weight:    (!) 148.6 kg  Height:       Physical Exam Vitals and nursing note reviewed.  Constitutional:      General: He is not in acute distress.    Appearance: He is obese. He is not ill-appearing.  HENT:     Head: Normocephalic and atraumatic.  Cardiovascular:     Rate and Rhythm: Normal rate and regular rhythm.     Pulses: Normal pulses.          Radial pulses are 2+ on the right side and 2+ on the left side.       Dorsalis pedis pulses are 2+ on the right side and 2+ on the left side.     Heart sounds: Normal heart sounds, S1 normal and S2 normal. No murmur heard.   Pulmonary:     Effort: No respiratory distress.     Breath sounds: No wheezing.  Abdominal:     Palpations: There is no mass.     Tenderness: There is no abdominal tenderness.  Musculoskeletal:     Right lower leg: 1+ Edema present.     Left lower leg: 1+ Edema present.  Neurological:     Mental Status: He is lethargic, disoriented and confused.      Assessment/Plan:  Principal Problem:   ICH (intracerebral hemorrhage) (HCC) Active Problems:   Intraventricular hemorrhage (HCC)   Encephalopathy   Goals of care, counseling/discussion   Palliative care by specialist   Chronic respiratory failure with hypoxia and hypercapnia (HCC)   Acute on chronic respiratory failure with hypoxia and hypercapnia (HCC)   Aspiration into airway   Acute respiratory failure (HCC)   Hypervolemia  Continues to be addressed with the family.Dr. Hilma Favors of palliative care feels that it is appropriate to focus on continued symptom  management and comfort measures.Dr. Hilma Favors talked with family Monday and decided to make him partial code as they do not want him on ventilator. Reinforced that nasal canula and BiPAP will still be used to supplement O2 as he needs.Discussed PEG tube placement, which family has stated they want and family still deciding on DNR status. -Await family decisionabout DNR status -Consult for PEG tube placementtentative placement tomorrow   Encephalopathy: Multifactorial due to respiratory failure, ICH, Uremia. Repeat CT (7/2) showed no acute changes. 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 No reversible causes have been identified. -SLP evaluated and recommendsfocusing on oral care and offering single ice chips at a time as alertness allows -Neurowasconsulted for assessmentrecommends palliative care as they felt that patient would not recover to a point were he could be independent or able to manage his own affairs -continues to be encephalopathic -Only minimal response to verbal commands    Acute Hypercarbic and Hypoxemic Respiratory Failure Aspiration PNA- finished antibiotics Was Extubated on 6/24. Pulmonology has recommended against re-intubation.Has been placed on BiPAPQHS andon nasal canula during the day. -Continue to monitor and reassess   AKI on CKD Stage IV: Per Nephrology consult during admission  dialysis is not recommended. -Continue monitoring Kidney function -NetI&Os yesterday is out200,continuetorsemide 40 mg per tube   Hypernatremiaresolved:With CoreTrak in place has been getting free water   Hypertension: [place] place have switched from IV to PO medications for control. Pressureshave remainedstable -Continue PO medsper tube. -Continue to monitor   T2DM: Glucose levelshavebeenstable overnight with one episode of hypoglycemia (62) -Continue sliding scale for glucose control   Prior to Admission Living  Arrangement:Home Anticipated Discharge Location:SNF Barriers to Discharge:multiple medical condions Dispo: Anticipated dischargepending ongoing goals of care conversation with family   Briant Cedar, MD 02/28/2020, 6:02 AM Pager: [phone] After 5pm on weekdays and 1pm on weekends: On Call pager [phone]

## 2020-02-28 NOTE — Consult Note (Signed)
Chief Complaint: Patient was seen in consultation today for percutaneous gastrostomy tube placement  Referring Physician(s): [place]  Supervising Physician: Corrie Mckusick  Patient Status: [facility] - In-pt   History of Present Illness: [name] is a 74 y.o. 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  He was admitted to [facility] on 6/11 with 3-day history of bifrontal headache, recent falls 2 to 3 weeks prior, increasing confusion/syncope and nausea and vomiting.  He was subsequently found to have small bilateral intraventricular hemorrhage of unclear etiology suspected to be traumatic given fall history versus spontaneous.  Hospitalization was complicated by aspiration pneumonia and worsening renal function with progressive decline in mental status.  Aspiration pneumonia has been treated.  He was intubated from 6/17-6/24.  He currently has NG tube in place but remains encephalopathic with overall poor prognosis.  Request now received for gastrostomy tube placement.  Palliative care also following.  Past Medical History:  Diagnosis Date  . Arthritis    "all over" (06/23/2013)  . Atrial fibrillation ([place])    not Candidate for Coumadin d/t rectal bleeding  . Benign prostatic hypertrophy   . Cardiomyopathy, hypertensive ([place])     diastolic dysfunction by 3614 echo  . Coronary artery disease     nonobstructive. 2 vessel. cardiac catheter in March 2005 showing 60% LAD occlusion, 30% circumflex occlusion.  . Degenerative joint disease     Right knee.  . Diabetes type 2, uncontrolled ([place])   .  Diabetic peripheral neuropathy ([place])   . Diabetic ulcer of thigh ([place])    H/O Rt thigh ulcer.  . Exertional shortness of breath    "comes and goes" (06/23/2013)  . GERD (gastroesophageal reflux disease)   . Gout   . Herpes   . Hyperlipidemia   . Hypertension   . Hypotension 09/05/2013  . Incarcerated ventral hernia    H/O. S/P repair ventral hernia repair 07/2005.  Marland Kitchen LBBB (left bundle branch block)   . Legally blind   . Morbid obesity ([place])   . Obstructive sleep apnea    On CPAP. Last sleep study (06/2009) - Severe obstructive sleep apnea/hypopnea syndrome, apnea-hypopnea index 70.5 per hour with non positional events moderately loud snoring and oxygen desaturation to a nadir of 85% on room air.  . Occult blood positive stool     History of in August 2007.  colonoscopy in January 2008 showing normal colon with fair inadequate prep. By Dr. Silvano Rusk.  . 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 Sepsis ([place]) 08/26/2013    Past Surgical History:  Procedure Laterality Date  . AMPUTATION Left 06/22/2013   Procedure: AMPUTATION FOOT;  Surgeon: Newt Minion, MD;  Location: [place];  Service: Orthopedics;  Laterality: Left;  Left Midfoot Amputation  . CARDIAC CATHETERIZATION  10/22/2003   Dilatation of the LV with preserved LV systolic function. Tortous aorta, but no AAA. Moderate disease with 50-60% area of narrowing in the circumflex after the second OM and 70% mid narrowing in the LAD. No intervention.  Marland Kitchen CARDIOVASCULAR STRESS TEST  01/23/2010   Perfusion defect seen in inferior myocardial region is consistent with diaphragmatic attenuation. Remaining myocardium demonstrates normal myocardial perfusion with no evidence of  ischemia or infarct. Stress ECG was non-diagnostic due to LBBB.  Marland Kitchen CATARACT EXTRACTION, BILATERAL Bilateral   . ESOPHAGOGASTRODUODENOSCOPY N/A 10/12/2013   Procedure: ESOPHAGOGASTRODUODENOSCOPY (EGD);  Surgeon: Gwenyth Ober, MD;  Location: [place];  Service:  General;  Laterality: N/A;  bedside  . EYE SURGERY    . 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 HERNIA REPAIR    . INSERT / REPLACE / REMOVE PACEMAKER  11-28-2003   Secondary to symptomatic bradycardia. DDDR pacemaker. By Dr. Rollene Fare.  Randolm Idol / REPLACE / REMOVE PACEMAKER  11/27/12   "old pacemaker died; had new one put in" (2013/07/15)  . Pacemaker lead revision  12/2003    likely microdislodgment  of right ventricular lead  . PEG PLACEMENT N/A 10/12/2013   Procedure: PERCUTANEOUS ENDOSCOPIC GASTROSTOMY (PEG) PLACEMENT;  Surgeon: Gwenyth Ober, MD;  Location: [place];  Service: General;  Laterality: N/A;  . PERMANENT PACEMAKER GENERATOR CHANGE N/A 11/03/2012   Procedure: PERMANENT PACEMAKER GENERATOR CHANGE;  Surgeon: Sanda Klein, MD;  Location: [place] CATH LAB;  Service: Cardiovascular;  Laterality: N/A;  . REFRACTIVE SURGERY Bilateral   . RIGHT HEART CATH N/A 09/05/2018   Procedure: RIGHT HEART CATH;  Surgeon: Larey Dresser, MD;  Location: [place] CV LAB;  Service: Cardiovascular;  Laterality: N/A;  . RIGHT/LEFT HEART CATH AND CORONARY ANGIOGRAPHY N/A 04/10/2019   Procedure: RIGHT/LEFT HEART CATH AND CORONARY ANGIOGRAPHY;  Surgeon: [name], MD;  Location: [place] CV LAB;  Service: Cardiovascular;  Laterality: N/A;  . TEE WITHOUT CARDIOVERSION N/A 09/05/2018   Procedure: TRANSESOPHAGEAL ECHOCARDIOGRAM (TEE);  Surgeon: Larey Dresser, MD;  Location: [facility] ENDOSCOPY;  Service: Cardiovascular;  Laterality: N/A;  . TEE WITHOUT CARDIOVERSION N/A 05/08/2019   Procedure: TRANSESOPHAGEAL ECHOCARDIOGRAM (TEE);  Surgeon: [name], MD;  Location: [place] CV LAB;  Service: Open Heart Surgery;  Laterality: N/A;  . TRANSCATHETER AORTIC VALVE REPLACEMENT, TRANSFEMORAL N/A 05/08/2019   Procedure: TRANSCATHETER AORTIC VALVE REPLACEMENT, TRANSFEMORAL;  Surgeon: [name], MD;  Location: [place] CV LAB;  Service: Open Heart Surgery;   Laterality: N/A;  . TRANSTHORACIC ECHOCARDIOGRAM  01/01/2013   EF 50-55%, ventricular septum-thickness moderately increased  . VENOUS DOPPLER  01/17/2013   No evidence of thrombus or thrombophlebitis. Left GSVs appear enlarged and demonstrate valvular insuffiiciency. Bilateral SSVs appeared patent with no valvular insufficiency seen.  Marland Kitchen VENTRAL HERNIA REPAIR  07/2005    S/P laparotomy, lysis of adhesions, repair of incarcerated ventral hernia with  partial omentectomy.. Performed by Dr. Marlou Starks.    Allergies: Patient has no known allergies.  Medications: Prior to Admission medications   Medication Sig Start Date End Date Taking? Authorizing Provider  acetaminophen (TYLENOL) 650 MG CR tablet Take 1,300 mg by mouth 2 (two) times daily.    Yes [provider]  amLODipine (NORVASC) 10 MG tablet Take 10 mg by mouth in the morning. Reported on 12/09/2015   Yes [provider]  amoxicillin (AMOXIL) 500 MG capsule Take 4 capsules by mouth one hour prior to dental work Patient taking differently: Take 2,000 mg by mouth See admin instructions. Take 4 capsules (2,000 mg) by mouth one hour prior to dental work 06/07/19  Yes Eileen Stanford, PA-C  antiseptic oral rinse (BIOTENE) LIQD 15 mLs by Mouth Rinse route 3 (three) times daily as needed for dry mouth.    Yes [provider]  aspirin EC 81 MG tablet Take 1 tablet (81 mg total) by mouth daily. 01/19/19  Yes  Clegg, Amy D, NP  Cholecalciferol (VITAMIN D-3) 25 MCG (1000 UT) CAPS Take 1,000 Units by mouth in the morning.   Yes [provider]  citalopram (CELEXA) 20 MG tablet Take 20 mg by mouth in the morning.    Yes [provider]  famotidine (PEPCID) 20 MG tablet Take 20 mg by mouth in the morning.    Yes [provider]  Febuxostat (ULORIC) 80 MG TABS Take 80 mg by mouth in the morning.    Yes [provider]  gabapentin (NEURONTIN) 300 MG capsule Take 300 mg by mouth 2 (two) times daily.    Yes [provider]  hydrALAZINE (APRESOLINE) 100 MG tablet Take 1 tablet (100 mg total) by mouth 3 (three) times daily. 01/16/20  Yes Larey Dresser, MD  insulin glargine (SEMGLEE) 100 UNIT/ML Solostar Pen Inject 100 Units into the skin at bedtime.   Yes [provider]  Liraglutide (VICTOZA) 18 MG/3ML SOPN Inject 1.8 mg into the skin daily.   Yes [provider]  loperamide (IMODIUM A-D) 2 MG tablet Take 1 mg by mouth 3 (three) times daily as needed for diarrhea or loose stools.   Yes [provider]  Menthol, Topical Analgesic, (BIOFREEZE EX) Apply 1 application topically 2 (two) times daily as needed (knee/shoulder pain).    Yes [provider]  metolazone (ZAROXOLYN) 2.5 MG tablet Take 2.5 mg by mouth daily.   Yes [provider]  metoprolol succinate (TOPROL-XL) 50 MG 24 hr tablet Take 1 tablet (50 mg total) by mouth daily. Take with or immediately following a meal. 03/21/18  Yes Larey Dresser, MD  OXYGEN Inhale 2 L/min into the lungs continuous.   Yes [provider]  polyethylene glycol (MIRALAX / GLYCOLAX) packet Take 17 g by mouth daily as needed (for hard stools- MIX AS DIRECTED AND DRINK).    Yes [provider]  potassium chloride SA (KLOR-CON) 20 MEQ tablet Take 80 mEq by mouth 2 (two) times daily.   Yes [provider]  rosuvastatin (CRESTOR) 20 MG tablet Take 20 mg by mouth at bedtime.    Yes [provider]  sennosides-docusate sodium (SENOKOT-S) 8.6-50 MG tablet Take 2 tablets by mouth 2 (two) times daily.    Yes [provider]  Skin Protectants, Misc. (EUCERIN) cream Apply 1 application topically as needed for dry skin (to affected areas of the body).    Yes [provider]  torsemide (DEMADEX) 100 MG tablet Take 100 mg by mouth 2 (two) times daily.   Yes [provider]     Family History  Problem Relation Age of Onset  . Coronary artery disease Sister   .  Heart disease Sister   . Allergies Sister   . Asthma Brother   . Rheum arthritis Brother     Social History   Socioeconomic History  . Marital status: Divorced    Spouse name: Not on file  . Number of children: 4  . Years of education: 66  . Highest education level: 12th grade  Occupational History  . Occupation: disabled  Tobacco Use  . Smoking status: Former Smoker    Packs/day: 0.00    Years: 0.00    Pack years: 0.00    Types: Cigarettes    Quit date: 08/17/2003    Years since quitting: 16.5  . Smokeless tobacco: Never Used  . Tobacco comment: started at age 12.  only smoked on occ- very rarely.0  Vaping Use  .  Vaping Use: Never used  Substance and Sexual Activity  . Alcohol use: Yes    Comment: 06/23/2013 "drank some; never had a problem w/it"  . Drug use: No  . Sexual activity: Never  Other Topics Concern  . Not on file  Social History Narrative   12/10- reports issues with housing- states it is not disability friendly but has ramp, first floor apt, and has grab bars/assistive devices provided by [employer].  Reports that he has concerns with having enough food but gets meals on wheels, eats at [employer] 3x a week, and has personal aid who cooks and shops for him.  Reports concerns with transportation but gets [facility] transport for all of his medical appts and his family or an aid helps him with other transportation needs.   Social Determinants of Health   Financial Resource Strain:   . Difficulty of Paying Living Expenses:   Food Insecurity:   . Worried About Charity fundraiser in the Last Year:   . Arboriculturist in the Last Year:   Transportation Needs:   . Film/video editor (Medical):   Marland Kitchen Lack of Transportation (Non-Medical):   Physical Activity:   . Days of Exercise per Week:   . Minutes of Exercise per Session:   Stress:   . Feeling of Stress :   Social Connections:   . Frequency of Communication with Friends and Family:   . Frequency of Social Gatherings with  Friends and Family:   . Attends Religious Services:   . Active Member of Clubs or Organizations:   . Attends Archivist Meetings:   Marland Kitchen Marital Status:       Review of Systems unable to obtain from patient secondary to encephalopathy; no recent fever/ worsening headache/respiratory function/nausea /vomiting or acute bleeding  Vital Signs: BP 138/68 (BP Location: Right Arm)   Pulse 68   Temp 98.3 F (36.8 C) (Axillary)   Resp (!) 21   Ht 5\' 9"  (1.753 m) Comment: measured x 3  Wt (!) 327 lb 9.7 oz (148.6 kg)   SpO2 92%   BMI 48.38 kg/m   Physical Exam patient awake, blind, daughter in room.  NG tube in place ; chest with diminished breath sounds bases.  Right-sided pacemaker in place.  Heart with regular rate /rhythm.  Abdomen obese, soft, positive bowel sounds, nontender.  Bilateral lower extremity edema noted, trace to 1+.  Imaging: EEG  Result Date: 02/15/2020 Antionette Poles, MD     02/15/2020  2:20 PM [facility] Consult Services Routine Inpatient Electroencephalogram (EEG) Indication: Encephalopathy Date of study: 0/09/2019 Brief History: 74 year old male with a history of stroke, atrial fibrillation, chronic kidney disease was noted to have intraventricular hemorrhage and has persistent altered mental status. Description:  This is a routine inpatient EEG using the International Standard 10-20 system of electrode placement. Photic stimulation and hyperventilation were deferred. The background showed theta slowing at 4-5 hertz, with also diffuse theta slowing as well. There were no definitive epileptiform discharges or electrographic seizures seen. Impression:  This is an abnormal EEG due to the presence of moderate to severe diffuse slowing.  Findings are suggestive of an underlying encephalopathy possibly related to toxic, metabolic conditions, versus diffuse structural brain abnormalities.  The absence of epileptiform discharges does not necessarily rule out  an underlying seizure disorder.  Clinical correlation is advised.   CT HEAD WO CONTRAST  Result Date: 02/15/2020 CLINICAL DATA:  74 year old male with worsening mental status, confusion. Anoxic brain  injury. Presented with small volume intraventricular and subarachnoid hemorrhage last month. EXAM: CT HEAD WITHOUT CONTRAST TECHNIQUE: Contiguous axial images were obtained from the base of the skull through the vertex without intravenous contrast. COMPARISON:  Head CT 01/31/2020 and earlier. FINDINGS: Brain: Trace residual intraventricular hemorrhage layering in the occipital horns. Trace residual left convexity subarachnoid hemorrhage is also possible. No new or increasing intracranial blood. No midline shift, mass effect, or evidence of intracranial mass lesion. Stable ventricle size and configuration. Confluent bilateral cerebral white matter hypodensity and heterogeneous deep gray nuclei appears stable. No acute cortically based infarct identified. Small area of chronic cortical encephalomalacia at the junction of the posterior left temporal and occipital lobes. Vascular: Calcified atherosclerosis at the skull base. No suspicious intracranial vascular hyperdensity. Skull: Stable, intact. Sinuses/Orbits: Improved paranasal sinus aeration. Small fluid level in the right sphenoid sinus is stable. Trace mastoid effusions. Tympanic cavities are stable. Other: Right side enteric feeding tube remains. No acute orbit or scalp soft tissue finding. IMPRESSION: 1. Evidence of advanced chronic small vessel disease but no CT findings of acute or evolving anoxic injury. 2. Trace residual intraventricular and subarachnoid hemorrhage, decreased since last month. No new intracranial hemorrhage. No intracranial mass effect. Electronically Signed   By: Genevie Ann M.D.   On: 02/15/2020 12:20   CT HEAD WO CONTRAST  Result Date: 01/31/2020 CLINICAL DATA:  Worsening mental status change EXAM: CT HEAD WITHOUT CONTRAST TECHNIQUE:  Contiguous axial images were obtained from the base of the skull through the vertex without intravenous contrast. COMPARISON:  January 30, 2020 FINDINGS: Brain: Again noted is a small amount of layering intraventricular hemorrhage in the posterior horns of the lateral ventricles as on prior exam. Again noted is a small amount of subarachnoid hemorrhage overlying the left parietal lobe as on prior exam. No new extra-axial collections are seen. There is dilatation the ventricles and sulci consistent with age-related atrophy. Low-attenuation changes in the deep white matter consistent with small vessel ischemia. Vascular: No hyperdense vessel. Calcifications are seen within the internal carotid artery. Skull: The skull is intact. No fracture or focal lesion identified. Sinuses/Orbits: Bilateral ethmoid air cell mucosal thickening is seen. Small amount of fluid within right maxillary sinus. Other: None IMPRESSION: Stable examination with small amount of bilateral intraventricular hemorrhage and subarachnoid overlying the left parietal lobe. No new hemorrhage. Findings consistent with age related atrophy and chronic small vessel ischemia Electronically Signed   By: Prudencio Pair M.D.   On: 01/31/2020 04:38   CT HEAD WO CONTRAST  Result Date: 01/30/2020 CLINICAL DATA:  Cerebral hemorrhage follow-up EXAM: CT HEAD WITHOUT CONTRAST TECHNIQUE: Contiguous axial images were obtained from the base of the skull through the vertex without intravenous contrast. COMPARISON:  CT head 01/28/2020, 01/25/2020 FINDINGS: Brain: Layering blood in the occipital horns bilaterally unchanged. Small amount of subarachnoid hemorrhage is present on the left, unchanged from the prior study. This is likely circulation of the intraventricular hemorrhage. No new hemorrhage. Generalized atrophy. Mild ventricular enlargement is stable. Extensive white matter disease diffusely is stable. Negative for acute infarct or mass.  No new hemorrhage. Vascular:  The negative for hyperdense vessel. Skull: Negative Sinuses/Orbits: . Small air-fluid mild mucosal edema paranasal level right sinuses sphenoid sinus. Bilateral cataract extraction.  No orbital mass or edema. Other: None IMPRESSION: Stable intraventricular hemorrhage. Small amount of subarachnoid hemorrhage on the left also stable. No new hemorrhage or infarct Mild ventricular enlargement is stable. Electronically Signed   By: Franchot Gallo M.D.  On: 01/30/2020 08:27   CT CHEST WO CONTRAST  Result Date: 02/14/2020 CLINICAL DATA:  Respiratory illness with nondiagnostic x-ray EXAM: CT CHEST WITHOUT CONTRAST TECHNIQUE: Multidetector CT imaging of the chest was performed following the standard protocol without IV contrast. COMPARISON:  Chest x-ray from earlier today FINDINGS: Cardiovascular: Cardiomegaly that is chronic. No pericardial effusion. Dual-chamber pacer from the right with leads in unremarkable position. Transcatheter aortic valve replacement. Extensive atherosclerotic calcification of the aorta and coronaries. Enlarged appearance of central pulmonary arteries. Mediastinum/Nodes: Feeding tube with tip reaching the pylorus. Prominent mediastinal lymph nodes likely related to vascular congestion. Lungs/Pleura: Dependent atelectasis and trace pleural fluid. No consolidation or Kerley lines Upper Abdomen: Negative Musculoskeletal: Generalized spondylosis. No acute or aggressive finding. Other: Bilateral gynecomastia. IMPRESSION: 1. Dependent atelectasis and trace pleural effusions. 2. Cardiomegaly without pulmonary edema. 3.  Aortic Atherosclerosis (ICD10-I70.0). Electronically Signed   By: Monte Fantasia M.D.   On: 02/14/2020 11:43   US RENAL  Result Date: 01/31/2020 CLINICAL DATA:  Acute kidney injury. EXAM: RENAL / URINARY TRACT ULTRASOUND COMPLETE COMPARISON:  08/28/2013 FINDINGS: Right Kidney: Renal measurements: 11.2 by 6.7 x 7.2 cm = volume: 301.5 mL. Mild increased cortical echogenicity.  Well-circumscribed, anechoic cyst arising from upper pole of right kidney measures 3.7 x 4.2 by 4.9 cm. No hydronephrosis Left Kidney: Renal measurements: 16.5 x 6.8 x 7.4 cm = volume: 436.2 mL. Increased parenchymal echogenicity. Multiple cysts are identified. The largest arises from the inferior pole of the left kidney measuring 6.0 x 4.3 x 4.1 cm. This cyst is mildly complicated with some internal echoes which may represent internal septation. Bladder: Not visualized. Other: None. IMPRESSION: 1. No hydronephrosis. 2. Increased cortical echogenicity of both kidneys compatible with chronic medical renal disease. 3. Bilateral renal cysts. Cyst arising from inferior pole of the left kidney is complicated by internal areas of increased echogenicity which may reflect septations. Consider more definitive characterization with nonemergent renal protocol MRI with and without contrast material. Electronically Signed   By: Kerby Moors M.D.   On: 01/31/2020 10:33   DG CHEST PORT 1 VIEW  Result Date: 02/25/2020 CLINICAL DATA:  Respiratory distress. EXAM: PORTABLE CHEST 1 VIEW COMPARISON:  Chest radiograph 02/15/2020.  Chest CT 02/14/2020 FINDINGS: Weighted enteric tube tip below the diaphragm not included in the field of view. Right-sided pacemaker in place. Prior aortic valve replacement. Lower lung volumes from prior exam. Mild cardiomegaly stable allowing for differences in technique and rotation. Grossly unchanged mediastinal contours. Vascular congestion, similar. Suspected small pleural effusions. Increasing patchy bibasilar opacities. IMPRESSION: 1. Lower lung volumes from prior exam with increasing patchy bibasilar opacities, favoring atelectasis but nonspecific. Recommend correlation for aspiration risk factors. 2. Stable cardiomegaly with suspected small pleural effusions and vascular congestion. Electronically Signed   By: Keith Rake M.D.   On: 02/25/2020 19:20   DG CHEST PORT 1 VIEW  Result Date:  02/15/2020 CLINICAL DATA:  Hypoxia EXAM: PORTABLE CHEST 1 VIEW COMPARISON:  Chest radiograph February 06, 2020 and chest CT February 14, 2020 FINDINGS: Feeding tube tip is below the diaphragm. There is cardiomegaly with pulmonary venous hypertension. There is mild interstitial edema. There is no airspace consolidation. Pacemaker leads are attached to the right atrium and right ventricle. No evident adenopathy. There is aortic atherosclerosis. No bone lesions. IMPRESSION: Cardiomegaly with pulmonary vascular congestion. There is a degree of interstitial edema. Suspect a degree of congestive heart failure. No consolidation. Feeding tube tip below diaphragm. Pacemaker leads attached to right atrium and right ventricle.  Aortic Atherosclerosis (ICD10-I70.0). Electronically Signed   By: Lowella Grip III M.D.   On: 02/15/2020 17:00   DG CHEST PORT 1 VIEW  Result Date: 02/06/2020 CLINICAL DATA:  Cerebral hemorrhage. Seizure. Endotracheal tube placement. EXAM: PORTABLE CHEST 1 VIEW COMPARISON:  02/04/2020 FINDINGS: 0520 hours. Endotracheal tube tip is 4.6 cm above the base of the carina. Feeding tube is obscured at the level of the lower mediastinum due to technique. The cardio pericardial silhouette is enlarged. Bibasilar collapse/consolidation again noted with vascular congestion and pulmonary edema. Telemetry leads overlie the chest. IMPRESSION: 1. Endotracheal tube tip is 4.6 cm above the base of the carina. 2. Bibasilar collapse/consolidation with pulmonary edema, similar to prior. Electronically Signed   By: Misty Stanley M.D.   On: 02/06/2020 08:43   DG Chest Port 1 View  Result Date: 02/04/2020 CLINICAL DATA:  Acute respiratory failure. EXAM: PORTABLE CHEST 1 VIEW COMPARISON:  One-view chest x-ray 02/03/2020 FINDINGS: The heart is enlarged. Endotracheal tube terminates 5.5 cm above the carina. Pacing wires are stable. Feeding tube courses off the inferior border the film. Diffuse interstitial edema has increased  slightly. Bilateral pleural effusions are present. Bibasilar airspace opacities are noted. IMPRESSION: Cardiomegaly with increasing interstitial edema and bilateral pleural effusions compatible with congestive heart failure. Bibasilar airspace opacities likely reflect atelectasis. Infection is not excluded. Electronically Signed   By: San Morelle M.D.   On: 02/04/2020 07:17   DG Chest Port 1 View  Result Date: 02/03/2020 CLINICAL DATA:  Evaluate respiratory failure. EXAM: PORTABLE CHEST 1 VIEW COMPARISON:  February 02, 2020 FINDINGS: The ETT is in good position. The distal feeding tube is not well assessed. Stable cardiomegaly. The hila and mediastinum are unchanged. Probable effusion with underlying opacity on the right. Diffuse bilateral pulmonary opacities have improved in the interval but persist. No other changes. IMPRESSION: 1. Cardiomegaly and persistent but improving pulmonary edema. 2. Probable effusion with underlying atelectasis on the right. 3. Support apparatus as above. Electronically Signed   By: Dorise Bullion III M.D   On: 02/03/2020 11:57   DG CHEST PORT 1 VIEW  Result Date: 02/02/2020 CLINICAL DATA:  ETT.  Shortness of breath. EXAM: PORTABLE CHEST 1 VIEW COMPARISON:  February 01, 2020 FINDINGS: The ETT appears to be in good position. The feeding tube is not well seen distally. Stable cardiomegaly. The hila and mediastinum are normal. Diffuse pulmonary edema is similar in the interval. No other interval changes. Stable pacemaker. IMPRESSION: 1. Support apparatus as above. 2. Cardiomegaly and stable moderate to severe pulmonary edema. Electronically Signed   By: Dorise Bullion III M.D   On: 02/02/2020 10:17   DG CHEST PORT 1 VIEW  Result Date: 02/01/2020 CLINICAL DATA:  Endotracheal intubation. EXAM: PORTABLE CHEST 1 VIEW COMPARISON:  One-view chest x-ray 01/31/2020 FINDINGS: Endotracheal tube is stable. Heart is enlarged. Diffuse edema and bilateral effusions are similar the prior  exam. Feeding tube extends off the inferior border of the film. IMPRESSION: 1. Stable appearance of diffuse edema and bilateral effusions. 2. Stable cardiomegaly. 3. Stable endotracheal tube. Electronically Signed   By: San Morelle M.D.   On: 02/01/2020 08:35   Portable Chest x-ray  Result Date: 01/31/2020 CLINICAL DATA:  ET tube EXAM: PORTABLE CHEST 1 VIEW COMPARISON:  01/30/2020 FINDINGS: Endotracheal tube is 4.4 cm above the carina. Right pacer is unchanged. Cardiomegaly. Diffuse bilateral airspace disease, worsening since prior study, favor edema. Suspect small bilateral effusions. IMPRESSION: Endotracheal tube 4.4 cm above the carina. Worsening bilateral airspace disease,  likely edema. Small layering effusions. Electronically Signed   By: Rolm Baptise M.D.   On: 01/31/2020 02:17   DG CHEST PORT 1 VIEW  Result Date: 01/30/2020 CLINICAL DATA:  Shortness of breath and confusion EXAM: PORTABLE CHEST 1 VIEW COMPARISON:  01/30/2020 at 8:13 a.m. FINDINGS: A feeding tube extends into the abdomen. Dual lead pacer remains in place. Prosthetic aortic valve noted. Moderate cardiomegaly. Fullness of the hila, which could be vascular. The patient is rotated to the right on today's radiograph, reducing diagnostic sensitivity and specificity. Increased vascular indistinctness compared to prior. Poor definition of the left hemidiaphragm suggesting a left lower lobe airspace opacity. IMPRESSION: 1. Cardiomegaly with pulmonary venous hypertension. 2. Left lower lobe airspace opacity potentially from atelectasis or pneumonia. 3. Fullness of the hila, which could be vascular but adenopathy is not excluded. Electronically Signed   By: Van Clines M.D.   On: 01/30/2020 18:44   DG CHEST PORT 1 VIEW  Result Date: 01/30/2020 CLINICAL DATA:  Wheezing EXAM: PORTABLE CHEST 1 VIEW COMPARISON:  January 29, 2020. FINDINGS: There is stable cardiomegaly with pulmonary venous hypertension. Pacemaker leads are attached to  the right atrium and right ventricle. 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 Feeding tube tip is below the diaphragm. There is a small left pleural effusion with left base atelectasis. There is slight right base atelectasis. No consolidation evident. IMPRESSION: Small left pleural effusion with bibasilar atelectasis. No airspace opacity evident. Stable cardiomegaly with pulmonary vascular congestion. No adenopathy evident. Postoperative changes noted. Feeding tube tip below diaphragm. Electronically Signed   By: Lowella Grip III M.D.   On: 01/30/2020 09:58   DG Abd Portable 1V  Result Date: 02/18/2020 CLINICAL DATA:  Feeding tube placement. EXAM: PORTABLE ABDOMEN - 1 VIEW COMPARISON:  11/05/2013 FINDINGS: Feeding tube tip is partially imaged, projecting over the MEDIAL RIGHT lung base. Tube tip may be within the RIGHT LOWER lobe or a tortuous esophagus. Visualized bowel gas pattern is nonobstructive. IMPRESSION: 1. Feeding tube tip projecting over the MEDIAL RIGHT lung base. Recommend removal and replacement. 2. Nonobstructive bowel gas pattern. Critical Value/emergent results were called by telephone at the time of interpretation on 02/18/2020 at 12:02 pm to provider Dr. Kai Levins, who verbally acknowledged these results. Electronically Signed   By: Nolon Nations M.D.   On: 02/18/2020 12:19    Labs:  CBC: Recent Labs    02/16/20 0637 02/19/20 1056 02/22/20 0804 02/26/20 1004  WBC 10.6* 10.5 13.6* 10.1  HGB 9.3* 10.4* 10.2* 9.8*  HCT 31.9* 36.8* 35.7* 34.9*  PLT 120* 154 135* 115*    COAGS: Recent Labs    05/07/19 0932 05/10/19 0204  INR 1.2 1.2  APTT 39*  --     BMP: Recent Labs    02/21/20 0416 02/22/20 0600 02/23/20 0240 02/25/20 0432  NA 145 142 144 146*  K 3.6 4.1 4.1 4.0  CL 107 105 106 107  CO2 27 27 27 29   GLUCOSE 101* 133* 113* 106*  BUN 63* 74* 88* 97*  CALCIUM 9.5 9.5 9.5 9.4  CREATININE 1.91* 2.17* 2.39* 2.20*  GFRNONAA 34* 30* 26* 29*  GFRAA 40* 34* 30*  34*    LIVER FUNCTION TESTS: Recent Labs    01/29/20 1152 01/30/20 0715 01/31/20 0706 02/15/20 0650  BILITOT 0.6 0.5 0.3 0.7  AST 99* 55* 35 19  ALT 120* 98* 71* 32  ALKPHOS 117 105 86 78  PROT 7.8 7.6 6.9 7.4  ALBUMIN 2.9* 2.8* 2.2* 2.8*  TUMOR MARKERS: No results for input(s): AFPTM, CEA, CA199, CHROMGRNA in the last 8760 hours.  A/P: 74 y.o. 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  He was admitted to [facility] on 6/11 with 3-day history of bifrontal headache, recent falls 2 to 3 weeks prior, increasing confusion/syncope and nausea and vomiting.  He was subsequently found to have small bilateral intraventricular hemorrhage of unclear etiology suspected to be traumatic given fall history versus spontaneous.  Hospitalization was complicated by aspiration pneumonia and worsening renal function with progressive decline in mental status.  Aspiration pneumonia has been treated.  He was intubated from 6/17-6/24.  He currently has NG tube in place but remains encephalopathic with overall poor prognosis.  Request now received for gastrostomy tube placement.  Latest imaging studies have been reviewed by Drs. [place] and [place]. Risks and benefits image guided gastrostomy tube placement was discussed with the patient's daughters ([name]/[name]) including, but not limited to the need for a barium enema during the procedure, bleeding, infection, peritonitis and/or damage to adjacent structures.  All of the patient's questions were answered, patient is agreeable to proceed.  Consent signed and in  chart.  Procedure tentatively planned for 7/16; if not done on 7/16  then will be performed next week.    Thank you for this interesting consult.  I greatly enjoyed meeting [name] and look forward to participating in their care.  A copy of this report was sent to the requesting provider on this date.  Electronically Signed: D. Rowe Robert, PA-C 02/28/2020, 1:10 PM   I spent a total of  30 minutes   in face to face in clinical consultation, greater than 50% of which was counseling/coordinating care for gastrostomy tube placement

## 2020-02-28 NOTE — Progress Notes (Addendum)
  Speech Language Pathology Treatment: Dysphagia;Cognitive-Linquistic  Patient Details Name: [name] MRN: [id number] DOB: 1946-04-30 Today's Date: 02/28/2020 Time: 2409-7353 SLP Time Calculation (min) (ACUTE ONLY): 20 min  Assessment / Plan / Recommendation Clinical Impression  SLP provided multiple communication opportunities with Max cues to increase verbal output. Pt responded intermittently throughout session, mostly to yes/no questions. He did respond with an appropriate social greeting x1 in response to greeting from MD. Pt opened his mouth to command x2 throughout session. Treatment today also focused heavily on oral care, brushing throughout his oral cavity and using thin liquids via swab. He automatically bites down with introduction of anything into his oral cavity. Oral suction was utilized throughout oral care, but still there is no overt coughing as thin liquids are introduced. I think we will need to be more aggressive overall with oral care to try to see what he can swallow. Recommend using a few small pieces of ice immediately after oral care to also help facilitate oral hygiene.    HPI HPI: Pt is a 74 year old with prior intracranial hemorrhage and blindness who presented with 3 days of headaches Head CT increased but still small volume intraventricular hemorrhage layering in the occipital horns of the lateral ventricles." Unable to perform MRI. BSE performed 10/04/19 recommended NPO, 01/27/20 NPO and unable to initiate po's due to respiratory status with decline and intubated 6/17-6/24, BiPAP 6/25 and discontinued during the day. Cortak disloged 7/5 and pulled 7/6. Per MD notes "pt's encephalopathy is improving somewhat but overall prognosis remains very poor".      SLP Plan  Continue with current plan of care       Recommendations  Diet recommendations: NPO (few pieces of ice after oral care) Medication Administration: Via alternative means                Oral  Care Recommendations: Oral care QID Follow up Recommendations: Skilled Nursing facility SLP Visit Diagnosis: Cognitive communication deficit (R41.841);Dysphagia, unspecified (R13.10) Plan: Continue with current plan of care       GO                Osie Bond., M.A. [facility] Pager [phone] Office [phone]  02/28/2020, 10:39 AM

## 2020-02-28 NOTE — Progress Notes (Signed)
Nutrition Follow-up  DOCUMENTATION CODES:   Obesity unspecified  INTERVENTION:  Once PEG is placed and cleared for use,  Provide Glucerna 1.2 formula starting at rate of 35 ml/hr and increase by 10 ml every 4 hours to goal rate of 65 ml/hr.   Provide 45 ml Prosource TF TID per tube.   Free water flushes of 200 ml every 4 hours per tube. (MD to adjust as appropriate)  Tube feeding to provide 2172 kcal, 127 grams of protein, and 2464 ml free water.   NUTRITION DIAGNOSIS:   Inadequate oral intake related to inability to eat as evidenced by NPO status; ongoing  GOAL:   Patient will meet greater than or equal to 90% of their needs; met with TF  MONITOR:   Weight trends, Labs, I & O's, TF tolerance  REASON FOR ASSESSMENT:   Consult Enteral/tube feeding initiation and management  ASSESSMENT:   Pt with a PMH significant for Afib, cardiomyopathy, PPM, coronary disease, HTN, legally blind, T2DM, HTN, and HLD presented with N/V, headache, recent fall, recent syncope and AMS. Pt admitted with ICH. Extubated 6/24.   Plans for PEG placement tomorrow. Pt has been tolerating his tube feeds well via Cortrak NGT. RD to modify Prostat to Prosource TF protein modular to accommodate for new hospital formulary. Recommend resuming TF once PEG is cleared for use. Recommendations stated above.    Labs and medications reviewed.   Diet Order:   Diet Order            Diet NPO time specified  Diet effective midnight                 EDUCATION NEEDS:   Not appropriate for education at this time  Skin:  Skin Assessment: Reviewed RN Assessment Skin Integrity Issues::  (pressure injury: nose) Other: amputation all L toes  Last BM:  7/13  Height:   Ht Readings from Last 1 Encounters:  01/31/20 '5\' 9"'$  (1.753 m)    Weight:   Wt Readings from Last 1 Encounters:  02/28/20 (!) 148.6 kg    Ideal Body Weight:  72.7 kg  BMI:  Body mass index is 48.38 kg/m.  Estimated Nutritional  Needs:   Kcal:  1900-2100  Protein:  120-140 grams  Fluid:  2 L/day  Corrin Parker, MS, RD, LDN RD pager number/after hours weekend pager number on Amion.

## 2020-02-28 NOTE — Plan of Care (Signed)
  Problem: Education: Goal: Knowledge of General Education information will improve Description: Including pain rating scale, medication(s)/side effects and non-pharmacologic comfort measures Outcome: Progressing   Problem: Health Behavior/Discharge Planning: Goal: Ability to manage health-related needs will improve Outcome: Progressing   Problem: Clinical Measurements: Goal: Ability to maintain clinical measurements within normal limits will improve Outcome: Progressing Goal: Will remain free from infection Outcome: Progressing Goal: Diagnostic test results will improve Outcome: Progressing Goal: Respiratory complications will improve Outcome: Progressing Goal: Cardiovascular complication will be avoided Outcome: Progressing   Problem: Activity: Goal: Risk for activity intolerance will decrease Outcome: Progressing   Problem: Nutrition: Goal: Adequate nutrition will be maintained Outcome: Progressing   Problem: Coping: Goal: Level of anxiety will decrease Outcome: Progressing   Problem: Elimination: Goal: Will not experience complications related to bowel motility Outcome: Progressing Goal: Will not experience complications related to urinary retention Outcome: Progressing   Problem: Pain Managment: Goal: General experience of comfort will improve Outcome: Progressing   Problem: Safety: Goal: Ability to remain free from injury will improve Outcome: Progressing   Problem: Skin Integrity: Goal: Risk for impaired skin integrity will decrease Outcome: Progressing   Problem: Education: Goal: Knowledge of disease or condition will improve Outcome: Progressing Goal: Knowledge of secondary prevention will improve Outcome: Progressing Goal: Knowledge of patient specific risk factors addressed and post discharge goals established will improve Outcome: Progressing   Problem: Intracerebral Hemorrhage Tissue Perfusion: Goal: Complications of Intracerebral Hemorrhage  will be minimized Outcome: Progressing

## 2020-02-29 DIAGNOSIS — E870 Hyperosmolality and hypernatremia: Secondary | ICD-10-CM

## 2020-02-29 LAB — CBC
HCT: 38.8 % — ABNORMAL LOW (ref 39.0–52.0)
Hemoglobin: 10.3 g/dL — ABNORMAL LOW (ref 13.0–17.0)
MCH: 23.6 pg — ABNORMAL LOW (ref 26.0–34.0)
MCHC: 26.5 g/dL — ABNORMAL LOW (ref 30.0–36.0)
MCV: 89 fL (ref 80.0–100.0)
Platelets: 130 10*3/uL — ABNORMAL LOW (ref 150–400)
RBC: 4.36 MIL/uL (ref 4.22–5.81)
RDW: 18.6 % — ABNORMAL HIGH (ref 11.5–15.5)
WBC: 9.9 10*3/uL (ref 4.0–10.5)
nRBC: 0 % (ref 0.0–0.2)

## 2020-02-29 LAB — GLUCOSE, CAPILLARY
Glucose-Capillary: 78 mg/dL (ref 70–99)
Glucose-Capillary: 83 mg/dL (ref 70–99)
Glucose-Capillary: 80 mg/dL (ref 70–99)
Glucose-Capillary: 73 mg/dL (ref 70–99)
Glucose-Capillary: 142 mg/dL — ABNORMAL HIGH (ref 70–99)
Glucose-Capillary: 156 mg/dL — ABNORMAL HIGH (ref 70–99)

## 2020-02-29 LAB — PROTIME-INR
INR: 1.2 (ref 0.8–1.2)
Prothrombin Time: 14.4 seconds (ref 11.4–15.2)

## 2020-02-29 LAB — COMPREHENSIVE METABOLIC PANEL
ALT: 17 U/L (ref 0–44)
AST: 16 U/L (ref 15–41)
Albumin: 2.6 g/dL — ABNORMAL LOW (ref 3.5–5.0)
Alkaline Phosphatase: 54 U/L (ref 38–126)
Anion gap: 9 (ref 5–15)
BUN: 89 mg/dL — ABNORMAL HIGH (ref 8–23)
CO2: 29 mmol/L (ref 22–32)
Calcium: 9.8 mg/dL (ref 8.9–10.3)
Chloride: 118 mmol/L — ABNORMAL HIGH (ref 98–111)
Creatinine, Ser: 2.19 mg/dL — ABNORMAL HIGH (ref 0.61–1.24)
GFR calc Af Amer: 34 mL/min — ABNORMAL LOW (ref >60)
GFR calc non Af Amer: 29 mL/min — ABNORMAL LOW (ref >60)
Glucose, Bld: 81 mg/dL (ref 70–99)
Potassium: 3.7 mmol/L (ref 3.5–5.1)
Sodium: 156 mmol/L — ABNORMAL HIGH (ref 135–145)
Total Bilirubin: 0.4 mg/dL (ref 0.3–1.2)
Total Protein: 7.7 g/dL (ref 6.5–8.1)

## 2020-02-29 MED ORDER — FREE WATER
200.0000 mL | Status: DC
  Administered 2020-02-29 – 2020-03-01 (×12): 200 mL

## 2020-02-29 NOTE — Progress Notes (Signed)
SLP Cancellation Note  Patient Details Name: Rajesh Wyss MRN: [id number] DOB: July 23, 1946   Cancelled treatment:       Reason Eval/Treat Not Completed: Other (comment) Pt tentatively scheduled for PEG today. Will hold PO trials pending possible procedure. Will f/u as able.   Osie Bond., M.A. [facility] Pager [phone] Office [phone]  02/29/2020, 8:14 AM

## 2020-02-29 NOTE — Progress Notes (Signed)
Subjective: Interviewed patient bedside. Patient was not responsive to verbal commands. Patient had minimal response to painful stimuli (sternal rub).  Objective:  Vital signs in last 24 hours: Vitals:   02/28/20 2335 02/29/20 0307 02/29/20 0341 02/29/20 0408  BP: (!) 151/84 (!) 140/98 (!) 140/98   Pulse: 64 62 (!) 58   Resp: 17 17 14    Temp:  98.3 F (36.8 C)    TempSrc:  Axillary    SpO2: 98% 100% 100%   Weight:    (!) 148.4 kg  Height:       Physical Exam Vitals and nursing note reviewed.  Constitutional:      General: He is not in acute distress.    Appearance: He is obese. He is not ill-appearing or toxic-appearing.  HENT:     Head: Normocephalic and atraumatic.  Cardiovascular:     Rate and Rhythm: Normal rate and regular rhythm.     Pulses: Normal pulses.          Radial pulses are 2+ on the right side and 2+ on the left side.       Dorsalis pedis pulses are 2+ on the right side and 2+ on the left side.     Heart sounds: Normal heart sounds, S1 normal and S2 normal. No murmur heard.   Pulmonary:     Effort: No respiratory distress.     Breath sounds: No wheezing.  Abdominal:     Palpations: There is no mass.     Tenderness: There is no abdominal tenderness.  Musculoskeletal:     Right lower leg: 1+ Edema present.     Left lower leg: 1+ Edema present.  Neurological:     Mental Status: He is lethargic, disoriented and confused.      Assessment/Plan:  Principal Problem:   ICH (intracerebral hemorrhage) (HCC) Active Problems:   Intraventricular hemorrhage (HCC)   Encephalopathy   Goals of care, counseling/discussion   Palliative care by specialist   Chronic respiratory failure with hypoxia and hypercapnia (HCC)   Acute on chronic respiratory failure with hypoxia and hypercapnia (HCC)   Aspiration into airway   Acute respiratory failure (HCC)   Hypervolemia   Goals of Care: Continues to be addressed with the family.Dr. Hilma Favors of palliative care  feels that it is appropriate to focus on continued symptom management and comfort measures.Dr. Hilma Favors talked with family Monday and they decided to make him partial code as they do not want him on ventilator. Reinforced that nasal canula and BiPAP will still be used to supplement O2 as he needs.Discussed PEG tube placement, which family has stated they want. Will be placed today. Will allow easier access for meds.Will continue to reach out to family as they are still deciding on DNR status. -Await family decisionabout DNR status -PEG tube placementtoday   Encephalopathy: Multifactorial due to respiratory failure, ICH, Uremia. Repeat CT (7/2) showed no acute changes. 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 No reversible causes have been identified. Will have PEG tube placed today. Will allow easy med dosing going forward -SLP evaluated and recommendsfocusing on oral care and recommends ice chips after oral care to help with oral hygiene. -continues to be encephalopathic -Patient had minimal response to painful stimuli (sternal rub). -PEG tube placement today   Acute Hypercarbic and Hypoxemic Respiratory Failure Aspiration PNA- finished antibiotics Was Extubated on 6/24. Pulmonology has recommended against re-intubation.Has been placed on BiPAPQHS andon nasal canula during the day. -Continue to monitor and reassess  AKI on CKD Stage IV: Per Nephrology consult during admission dialysis is not recommended. -Continue monitoring Kidney function -NetI&Os yesterday is out1600,continuetorsemide 40 mg per tube   Hypernatremiaresolved:With CoreTrak in place has been getting free water. Will switch to PEG tube once placed.   Hypertension: WithCoreTrakin place have switched from IV to PO medications for control. Pressureshave remainedstable. Once PEG tube is in place will use that access for meds. -Continue PO medsper CoreTrak but will use PEG tube once  placed. -Continue to monitor   T2DM: Glucose levelshavebeenstable overnight with one episode of hypoglycemia (62) -Continue sliding scale for glucose control   Prior to Admission Living Arrangement:Home Anticipated Discharge Location:SNF Barriers to Discharge:multiple medical condions Dispo: Anticipated dischargepending ongoing goals of care conversation with family   Briant Cedar, MD 02/29/2020, 5:56 AM Pager: [phone] After 5pm on weekdays and 1pm on weekends: On Call pager [phone]

## 2020-03-01 LAB — BASIC METABOLIC PANEL WITH GFR
Anion gap: 10 (ref 5–15)
BUN: 80 mg/dL — ABNORMAL HIGH (ref 8–23)
CO2: 31 mmol/L (ref 22–32)
Calcium: 10 mg/dL (ref 8.9–10.3)
Chloride: 118 mmol/L — ABNORMAL HIGH (ref 98–111)
Creatinine, Ser: 2.13 mg/dL — ABNORMAL HIGH (ref 0.61–1.24)
GFR calc Af Amer: 35 mL/min — ABNORMAL LOW
GFR calc non Af Amer: 30 mL/min — ABNORMAL LOW
Glucose, Bld: 189 mg/dL — ABNORMAL HIGH (ref 70–99)
Potassium: 3.7 mmol/L (ref 3.5–5.1)
Sodium: 159 mmol/L — ABNORMAL HIGH (ref 135–145)

## 2020-03-01 LAB — GLUCOSE, CAPILLARY
Glucose-Capillary: 115 mg/dL — ABNORMAL HIGH (ref 70–99)
Glucose-Capillary: 120 mg/dL — ABNORMAL HIGH (ref 70–99)
Glucose-Capillary: 129 mg/dL — ABNORMAL HIGH (ref 70–99)
Glucose-Capillary: 133 mg/dL — ABNORMAL HIGH (ref 70–99)
Glucose-Capillary: 136 mg/dL — ABNORMAL HIGH (ref 70–99)
Glucose-Capillary: 140 mg/dL — ABNORMAL HIGH (ref 70–99)
Glucose-Capillary: 168 mg/dL — ABNORMAL HIGH (ref 70–99)

## 2020-03-01 MED ORDER — FREE WATER
400.0000 mL | Status: DC
  Administered 2020-03-01 – 2020-03-02 (×10): 400 mL
  Administered 2020-03-03: 30 mL
  Administered 2020-03-03: 400 mL
  Administered 2020-03-03 (×3): 30 mL
  Administered 2020-03-03: 400 mL
  Administered 2020-03-03: 30 mL
  Administered 2020-03-04 – 2020-03-08 (×48): 400 mL

## 2020-03-01 NOTE — Plan of Care (Signed)
  Problem: Education: Goal: Knowledge of General Education information will improve Description: Including pain rating scale, medication(s)/side effects and non-pharmacologic comfort measures Outcome: Progressing   Problem: Health Behavior/Discharge Planning: Goal: Ability to manage health-related needs will improve Outcome: Progressing   Problem: Clinical Measurements: Goal: Ability to maintain clinical measurements within normal limits will improve Outcome: Progressing Goal: Will remain free from infection Outcome: Progressing Goal: Diagnostic test results will improve Outcome: Progressing Goal: Respiratory complications will improve Outcome: Progressing Goal: Cardiovascular complication will be avoided Outcome: Progressing   Problem: Activity: Goal: Risk for activity intolerance will decrease Outcome: Progressing   Problem: Nutrition: Goal: Adequate nutrition will be maintained Outcome: Progressing   Problem: Coping: Goal: Level of anxiety will decrease Outcome: Progressing   Problem: Elimination: Goal: Will not experience complications related to bowel motility Outcome: Progressing Goal: Will not experience complications related to urinary retention Outcome: Progressing   Problem: Pain Managment: Goal: General experience of comfort will improve Outcome: Progressing   Problem: Safety: Goal: Ability to remain free from injury will improve Outcome: Progressing   Problem: Skin Integrity: Goal: Risk for impaired skin integrity will decrease Outcome: Progressing   Problem: Education: Goal: Knowledge of disease or condition will improve Outcome: Progressing Goal: Knowledge of secondary prevention will improve Outcome: Progressing Goal: Knowledge of patient specific risk factors addressed and post discharge goals established will improve Outcome: Progressing   Problem: Intracerebral Hemorrhage Tissue Perfusion: Goal: Complications of Intracerebral Hemorrhage  will be minimized Outcome: Progressing

## 2020-03-01 NOTE — Progress Notes (Signed)
Subjective: Interviewed patient at bedside. When asked how he was doing he responded with "I'm doing ok." When asked if he had any pain he said no. Was not responsive to further questioning.  Objective:  Vital signs in last 24 hours: Vitals:   03/01/20 0016 03/01/20 0300 03/01/20 0400 03/01/20 0500  BP:  127/82 (!) 145/78   Pulse: 69 64 67   Resp: 16 18 17    Temp:   98.6 F (37 C)   TempSrc:   Oral   SpO2: 98% 99% 96%   Weight:    (!) 148.4 kg  Height:       Physical Exam Vitals and nursing note reviewed.  Constitutional:      Appearance: He is obese.  HENT:     Head: Normocephalic and atraumatic.  Cardiovascular:     Rate and Rhythm: Normal rate and regular rhythm.     Pulses: Normal pulses.          Radial pulses are 2+ on the right side and 2+ on the left side.       Dorsalis pedis pulses are 2+ on the right side and 2+ on the left side.     Heart sounds: Normal heart sounds, S1 normal and S2 normal. No murmur heard.   Pulmonary:     Effort: No respiratory distress.     Breath sounds: No wheezing.  Abdominal:     General: There is distension.     Palpations: Abdomen is soft. There is no mass.     Tenderness: There is no abdominal tenderness.  Musculoskeletal:     Right lower leg: 1+ Edema present.     Left lower leg: 1+ Edema present.  Neurological:     Mental Status: He is lethargic, disoriented and confused.      Assessment/Plan:  Principal Problem:   ICH (intracerebral hemorrhage) (HCC) Active Problems:   Intraventricular hemorrhage (HCC)   Encephalopathy   Goals of care, counseling/discussion   Palliative care by specialist   Chronic respiratory failure with hypoxia and hypercapnia (HCC)   Acute on chronic respiratory failure with hypoxia and hypercapnia (HCC)   Aspiration into airway   Acute respiratory failure (HCC)   Hypervolemia  Goals of Care: Continues to be addressed with the family.Dr. Hilma Favors of palliative care feels that it is  appropriate to focus on continued symptom management and comfort measures.Dr. Arby Barrette family Monday7/12 and they decided to make himpartial code as they do not want him on ventilator. Reinforced that nasal canula and BiPAP will still be used to supplement O2 as he needs.Discussed PEG tube placement, which family has stated they want. Will be placed Monday 7/19. Will allow easier access for meds.Will continue to reach out to family as they are still deciding on DNR status. -Await family decisionabout DNR status -PEG tube was unable to be done yesterday will be done monday   Encephalopathy: Multifactorial due to respiratory failure, ICH, Uremia. Repeat CT (7/2) showed no acute changes. 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 No reversible causes have been identified. Will have PEG tube placed monday. Will allow easy med dosing going forward -SLP evaluated and recommendsfocusing on oral care and recommends ice chips after oral care to help with oral hygiene. -continues to be encephalopathic -Patient had minimal response to painful stimuli (sternal rub). -PEG tube was unable to be done yesterday will be done monday   Acute Hypercarbic and Hypoxemic Respiratory Failure Aspiration PNA- finished antibiotics Was Extubated on 6/24. Pulmonology has  recommended against re-intubation.Has been placed on BiPAPQHS andon nasal canula during the day. -Continue to monitor and reassess   AKI on CKD Stage IV: Per Nephrology consult during admission dialysis is not recommended. -Continue monitoring Kidney function -NetI&Os yesterday is out1650,continuetorsemide 40 mg per tube   Hypernatremia:With CoreTrak in place has been getting free water. Will switch to PEG tube once placed. Na yesterday was 156 so free water was increased to 200cc q2hr -Will reassess Na and will make appropriate changes in free water amount   Hypertension: WithCoreTrakin place have  switched from IV to PO medications for control. Pressureshave remainedstable. Once PEG tube is in place will use that access for meds. -ContinuePO medsper CoreTrak but will use PEG tube once placed. -Continue to monitor   T2DM: Glucose levelshavebeenstable overnightwith one episode of hypoglycemia (62) -Continue sliding scale for glucose control   Prior to Admission Living Arrangement:Home Anticipated Discharge Location:[facility] Barriers to Discharge:multiple medical condions Dispo: Anticipated dischargepending ongoing goals of care conversation with family  Briant Cedar, MD 03/01/2020, 6:11 AM Pager: [phone] After 5pm on weekdays and 1pm on weekends: On Call pager [phone]

## 2020-03-01 NOTE — Progress Notes (Signed)
RT placed pt on Bipap/NIV PC through [place] notified day shift RT it was okay to place pt on Vent even with mitts on. RT will continue to monitor.

## 2020-03-01 NOTE — Progress Notes (Signed)
Pt taken off Bipap/NIV PC and placed on 6L [place] nasal cannula with humidity. RT will continue to monitor.

## 2020-03-02 LAB — GLUCOSE, CAPILLARY
Glucose-Capillary: 77 mg/dL (ref 70–99)
Glucose-Capillary: 79 mg/dL (ref 70–99)
Glucose-Capillary: 74 mg/dL (ref 70–99)
Glucose-Capillary: 116 mg/dL — ABNORMAL HIGH (ref 70–99)
Glucose-Capillary: 103 mg/dL — ABNORMAL HIGH (ref 70–99)
Glucose-Capillary: 113 mg/dL — ABNORMAL HIGH (ref 70–99)

## 2020-03-02 LAB — BASIC METABOLIC PANEL
Anion gap: 12 (ref 5–15)
BUN: 84 mg/dL — ABNORMAL HIGH (ref 8–23)
CO2: 29 mmol/L (ref 22–32)
Calcium: 10 mg/dL (ref 8.9–10.3)
Chloride: 116 mmol/L — ABNORMAL HIGH (ref 98–111)
Creatinine, Ser: 2.17 mg/dL — ABNORMAL HIGH (ref 0.61–1.24)
GFR calc Af Amer: 34 mL/min — ABNORMAL LOW (ref >60)
GFR calc non Af Amer: 30 mL/min — ABNORMAL LOW (ref >60)
Glucose, Bld: 88 mg/dL (ref 70–99)
Potassium: 3.6 mmol/L (ref 3.5–5.1)
Sodium: 157 mmol/L — ABNORMAL HIGH (ref 135–145)

## 2020-03-02 NOTE — Progress Notes (Signed)
   Subjective: Patient was in bed. He did respond "I'm doing okay" again today and then did not answer any further questions. Not following any commands.   Objective:  Vital signs in last 24 hours: Vitals:   03/01/20 2047 03/02/20 0000 03/02/20 0340 03/02/20 0500  BP:  (!) 142/78 140/82   Pulse:  66 79   Resp:  (!) 21 17   Temp:  98 F (36.7 C) 98.2 F (36.8 C)   TempSrc:  Axillary Axillary   SpO2: 98% 99% 97%   Weight:    132.9 kg  Height:       General: Elderly male, ill appearing, NGT in place, encephalopathic Cardiac: RRR, no m/r/g Pulmonary: CTABL, no obvious wheezing, rhonchi, or rales Abdomen: Obese abdomen, distended, normoactive bowel sounds Neuro: Encephalopathic, not following commands  Assessment/Plan:  Principal Problem:   ICH (intracerebral hemorrhage) (HCC) Active Problems:   Intraventricular hemorrhage (HCC)   Encephalopathy   Goals of care, counseling/discussion   Palliative care by specialist   Chronic respiratory failure with hypoxia and hypercapnia (HCC)   Acute on chronic respiratory failure with hypoxia and hypercapnia (HCC)   Aspiration into airway   Acute respiratory failure (HCC)   Hypervolemia  This is a 74 year old male with a history of atrial fibrillation not on anticoagulation 2/2 rectal bleed, CM,, CAD, HTN, legally blind, DM, HTN, HLD and morbid obesity who initially presented with n/v headaches and recent falls and found to have ICH. Developed aspiration pneumonia requiring intubation. Hospital course now complicated by continued encephalopathy with poor prognosis.   Goals of Care: Continues to be addressed with the family. Currently DNI. DNR status still being discussed. Palliative care following, appreciate assistance. Overall has a poor prognosis. Family agreeable to PEG tube placement.  -Continue to address with family -PEG tube scheduled 7/19  Encephalopathy:  Multifactorial due to respiratory failure, ICH, Uremia. Repeat CT  (7/2) showed no acute changes. 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 No reversible causes have been identified. -Neurowasconsulted for assessmentrecommends palliative care as they felt that patient would not recover to a point were he could be independent or able to manage his own affairs -Currently has mittens on to prevent him removing any equipment -No further changes, continues to be encephalopathic  T2DM: Getting tube feeds right now.  -Continue Levemir 50 units twice daily.  -Continue sliding scale insulin  Acute Hypercarbic and Hypoxemic Respiratory Failure Aspiration PNA- finished antibiotics  Was Extubated on 6/24. Pulmonology has recommended against re-intubation.Continues to [place]. Due to CoreTrak BiPAP is unable to be used.Stable O2 sats will continue to monitor.Currently on 4 L/min HFNC. He has increased work of breathing and is at a high risk of developing respiratory distress.   AKI on CKD Stage IV: Per Nephrology consult during admission dialysis is not recommended.BUN and Cr continue to worsen.  -Continue monitoring Kidney function -Continuetorsemide 40 mg per tube  Hypernatremia: Na 157 this morning, down from 159 the day prior. On free water per tube.  -Free water per tube -Daily BMP  Hypertension: WithCoreTrakin place have switched from IV to PO medications for control. -Resumed PO medsper tube.pressurehave remainedstable will continue to monitor  Prior to Admission Living Arrangement: Home Anticipated Discharge Location: LTACH Barriers to Discharge: Continued medical therapy Dispo: Anticipated discharge in approximately 3-4 day(s).   Asencion Noble, MD 03/02/2020, 6:44 AM Pager: [phone] After 5pm on weekdays and 1pm on weekends: On Call pager [phone]

## 2020-03-03 ENCOUNTER — Inpatient Hospital Stay (HOSPITAL_COMMUNITY): Payer: Medicare (Managed Care)

## 2020-03-03 DIAGNOSIS — Z978 Presence of other specified devices: Secondary | ICD-10-CM

## 2020-03-03 HISTORY — PX: IR GASTROSTOMY TUBE MOD SED: IMG625

## 2020-03-03 LAB — GLUCOSE, CAPILLARY
Glucose-Capillary: 101 mg/dL — ABNORMAL HIGH (ref 70–99)
Glucose-Capillary: 115 mg/dL — ABNORMAL HIGH (ref 70–99)
Glucose-Capillary: 122 mg/dL — ABNORMAL HIGH (ref 70–99)
Glucose-Capillary: 85 mg/dL (ref 70–99)
Glucose-Capillary: 87 mg/dL (ref 70–99)
Glucose-Capillary: 90 mg/dL (ref 70–99)
Glucose-Capillary: 96 mg/dL (ref 70–99)

## 2020-03-03 LAB — BASIC METABOLIC PANEL
Anion gap: 11 (ref 5–15)
BUN: 79 mg/dL — ABNORMAL HIGH (ref 8–23)
CO2: 28 mmol/L (ref 22–32)
Calcium: 9.6 mg/dL (ref 8.9–10.3)
Chloride: 114 mmol/L — ABNORMAL HIGH (ref 98–111)
Creatinine, Ser: 2.18 mg/dL — ABNORMAL HIGH (ref 0.61–1.24)
GFR calc Af Amer: 34 mL/min — ABNORMAL LOW (ref >60)
GFR calc non Af Amer: 29 mL/min — ABNORMAL LOW (ref >60)
Glucose, Bld: 98 mg/dL (ref 70–99)
Potassium: 3.9 mmol/L (ref 3.5–5.1)
Sodium: 153 mmol/L — ABNORMAL HIGH (ref 135–145)

## 2020-03-03 MED ORDER — GLUCAGON HCL RDNA (DIAGNOSTIC) 1 MG IJ SOLR
INTRAMUSCULAR | Status: AC
  Filled 2020-03-03: qty 1

## 2020-03-03 MED ORDER — FENTANYL CITRATE (PF) 100 MCG/2ML IJ SOLN
INTRAMUSCULAR | Status: AC | PRN
  Administered 2020-03-03 (×3): 25 ug via INTRAVENOUS

## 2020-03-03 MED ORDER — CEFAZOLIN SODIUM-DEXTROSE 2-4 GM/100ML-% IV SOLN
INTRAVENOUS | Status: AC
  Filled 2020-03-03: qty 100

## 2020-03-03 MED ORDER — CEFAZOLIN SODIUM-DEXTROSE 2-4 GM/100ML-% IV SOLN
2.0000 g | Freq: Once | INTRAVENOUS | Status: AC
  Administered 2020-03-03: 2 g via INTRAVENOUS
  Filled 2020-03-03: qty 100

## 2020-03-03 MED ORDER — IOHEXOL 300 MG/ML  SOLN
50.0000 mL | Freq: Once | INTRAMUSCULAR | Status: AC | PRN
  Administered 2020-03-03: 10 mL

## 2020-03-03 MED ORDER — MIDAZOLAM HCL 2 MG/2ML IJ SOLN
INTRAMUSCULAR | Status: AC
  Filled 2020-03-03: qty 2

## 2020-03-03 MED ORDER — LIDOCAINE HCL 1 % IJ SOLN
INTRAMUSCULAR | Status: AC
  Filled 2020-03-03: qty 20

## 2020-03-03 MED ORDER — CEFAZOLIN (ANCEF) 1 G IV SOLR: 2.0000 g | Freq: Once | INTRAVENOUS | Status: DC

## 2020-03-03 MED ORDER — MIDAZOLAM HCL 2 MG/2ML IJ SOLN
INTRAMUSCULAR | Status: AC | PRN
  Administered 2020-03-03 (×3): 0.5 mg via INTRAVENOUS

## 2020-03-03 MED ORDER — FENTANYL CITRATE (PF) 100 MCG/2ML IJ SOLN
INTRAMUSCULAR | Status: AC
  Filled 2020-03-03: qty 2

## 2020-03-03 MED ORDER — GLUCAGON HCL RDNA (DIAGNOSTIC) 1 MG IJ SOLR
INTRAMUSCULAR | Status: AC | PRN
  Administered 2020-03-03: .5 mg via INTRAVENOUS

## 2020-03-03 NOTE — Progress Notes (Signed)
Subjective: Interviewed patient at bedside. He had just returned from his PEG tube placement and was still sedated. He appeared to tolerate the procedure well.  Objective:  Vital signs in last 24 hours: Vitals:   03/02/20 2048 03/03/20 0000 03/03/20 0315 03/03/20 0500  BP:  129/82    Pulse:  70    Resp:  (!) 22 16   Temp:  98.7 F (37.1 C) 98.5 F (36.9 C)   TempSrc:  Axillary Axillary   SpO2: 99% 98% 97%   Weight:    132.8 kg  Height:       Physical Exam Vitals and nursing note reviewed.  Constitutional:      General: He is not in acute distress.    Appearance: He is obese. He is not ill-appearing or toxic-appearing.  Cardiovascular:     Rate and Rhythm: Normal rate and regular rhythm.     Pulses: Normal pulses.          Radial pulses are 2+ on the right side and 2+ on the left side.       Dorsalis pedis pulses are 2+ on the right side and 2+ on the left side.     Heart sounds: Normal heart sounds, S1 normal and S2 normal. No murmur heard.   Pulmonary:     Effort: No respiratory distress.     Breath sounds: No wheezing.  Abdominal:     General: There is no distension.     Palpations: There is no mass.     Tenderness: There is no abdominal tenderness.  Musculoskeletal:     Right lower leg: 1+ Edema present.     Left lower leg: 1+ Edema present.  Neurological:     Comments: Unable to assess as just returned from procedure      Assessment/Plan:  Principal Problem:   ICH (intracerebral hemorrhage) ([place]) Active Problems:   Intraventricular hemorrhage (HCC)   Encephalopathy   Goals of care, counseling/discussion   Palliative care by specialist   Chronic respiratory failure with hypoxia and hypercapnia (HCC)   Acute on chronic respiratory failure with hypoxia and hypercapnia (HCC)   Aspiration into airway   Acute respiratory failure (HCC)   Hypervolemia  This is a 74 year old male with a history of atrial fibrillation not on anticoagulation 2/2 rectal bleed,  CM,, CAD, HTN, legally blind, DM, HTN, HLD and morbid obesity who initially presented with n/v headaches and recent falls and found to have [place]. Developed aspiration pneumonia requiring intubation. Hospital course now complicated by continued encephalopathy with poor prognosis.    Goals of Care: Continues to be addressed with the family.Currently DNI. DNR status still being discussed. Palliative care following, appreciate assistance. Overall has a poor prognosis. Family agreeable to PEG tube placement. PEG tube should be placed today. -Continue to address with family -PEG tube was successfully placed   Encephalopathy:  Multifactorial due to respiratory failure, ICH, Uremia. Repeat CT (7/2) showed no acute changes. 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 No reversible causes have been identified. -Neurowasconsulted for assessmentrecommends palliative care as they felt that patient would not recover to a point were he could be independent or able to manage his own affairs -Currently has mittens on to prevent him removing any equipment -No further changes, continues to be encephalopathic   T2DM: Getting tube feeds right now.  -Continue Levemir 50 units twice daily.  -Continue sliding scaleinsulin   Acute Hypercarbic and Hypoxemic Respiratory Failure Aspiration PNA- finished antibiotics  Was  Extubated on 6/24. Pulmonology has recommended against re-intubation.Continues to [place].  BiPAP QHS should not be an issue now that CoreTrak is removed and PEG tub has been placed.Stable O2 sats will continue to monitor.Currently on 4 L/min HFNC. He has increased work of breathing and is at a high risk of developing respiratory distress.    AKI on CKD Stage IV: Per Nephrology consult during admission dialysis is not recommended.BUN and Cr continue to worsen. BUN 79 and Cr is 2.18 -Continue monitoring Kidney function -Continuetorsemide 40 mg per tube   Hypernatremia: Na  153 this morning, down from 157 the day prior. On free water per tube. PEG tube successfully placed will resume feeding. -Free water per tube 400cc q2 hr once PEG tube placed -Daily BMP   Hypertension: WithCoreTrakremoved due to successful PEG tub placement will now use that. -Resumed PO medsper tube.pressurehave remainedstable will continue to monitor   Prior to Admission Living Arrangement: home Anticipated Discharge Location: LTACH Barriers to Discharge: complex medical condition Dispo: Anticipated discharge in approximately 2-5 day(s).   Briant Cedar, MD 03/03/2020, 6:49 AM Pager: [phone] After 5pm on weekdays and 1pm on weekends: On Call pager [phone]

## 2020-03-03 NOTE — Discharge Summary (Addendum)
Name: [name] MRN: [id number] DOB: 1945-12-19 74 y.o. PCP: [name], MD  Date of Admission: 01/25/2020  1:11 PM Date of Discharge: 03/10/2020 Attending Physician: Velna Ochs, MD  Discharge Diagnosis: 1. Intraventricular Hemorrhage  2. Encephalopathy  3. Acute Hypercarbic and Hypoxemic Respiratory Failure 2/2 Aspiration PNA 4. AKI on CKD Stage IV 5. Hypernatremia 6. HTN 7. Type II DM   Discharge Medications: Allergies as of 03/10/2020   No Known Allergies      Medication List     STOP taking these medications    amoxicillin 500 MG capsule Commonly known as: AMOXIL   antiseptic oral rinse Liqd   BIOFREEZE EX   citalopram 20 MG tablet Commonly known as: CELEXA   eucerin cream   gabapentin 300 MG capsule Commonly known as: NEURONTIN   loperamide 2 MG tablet Commonly known as: IMODIUM A-D   metolazone 2.5 MG tablet Commonly known as: ZAROXOLYN   metoprolol succinate 50 MG 24 hr tablet Commonly known as: TOPROL-XL   potassium chloride SA 20 MEQ tablet Commonly known as: KLOR-CON   rosuvastatin 20 MG tablet Commonly known as: CRESTOR   Semglee 100 UNIT/ML Solostar Pen Generic drug: insulin glargine   sennosides-docusate sodium 8.6-50 MG tablet Commonly known as: SENOKOT-S   Victoza 18 MG/3ML Sopn Generic drug: liraglutide   Vitamin D-3 25 MCG (1000 UT) Caps       TAKE these medications    acetaminophen 650 MG CR tablet Commonly known as: TYLENOL Take 1,300 mg by mouth 2 (two) times daily.   amLODipine 10 MG tablet Commonly known as: NORVASC Place 1 tablet (10 mg total) into feeding tube daily. Start taking on: March 11, 2020 What changed:  how to take this when to take this additional instructions   aspirin EC 81 MG tablet Take 1 tablet (81 mg total) by mouth daily.   carvedilol 12.5 MG tablet Commonly known as: COREG Place 1 tablet (12.5 mg total) into feeding tube 2 (two) times daily with a meal.     docusate 50 MG/5ML liquid Commonly known as: COLACE Place 10 mLs (100 mg total) into feeding tube 2 (two) times daily.   famotidine 20 MG tablet Commonly known as: PEPCID Place 1 tablet (20 mg total) into feeding tube daily. Start taking on: March 11, 2020 What changed:  how to take this when to take this   feeding supplement (GLUCERNA 1.2 CAL) Liqd Place 1,000 mLs into feeding tube continuous.   feeding supplement (PROSource TF) liquid Place 45 mLs into feeding tube 3 (three) times daily.   free water Soln Place 400 mLs into feeding tube every 4 (four) hours.   hydrALAZINE 50 MG tablet Commonly known as: APRESOLINE Place 1 tablet (50 mg total) into feeding tube every 6 (six) hours. What changed:  medication strength how much to take how to take this when to take this   insulin aspart 100 UNIT/ML injection Commonly known as: novoLOG Inject 6 Units into the skin every 4 (four) hours.   insulin aspart 100 UNIT/ML injection Commonly known as: novoLOG Inject 0-20 Units into the skin every 4 (four) hours.   insulin detemir 100 UNIT/ML injection Commonly known as: LEVEMIR Inject 0.4 mLs (40 Units total) into the skin 2 (two) times daily.   OXYGEN Inhale 2 L/min into the lungs continuous.   polyethylene glycol 17 g packet Commonly known as: MIRALAX / GLYCOLAX 17 g by Per NG tube route daily as needed for mild constipation or moderate constipation.  What changed:  how to take this reasons to take this   polyethylene glycol 17 g packet Commonly known as: MIRALAX / GLYCOLAX Place 17 g into feeding tube daily. Start taking on: March 11, 2020 What changed: You were already taking a medication with the same name, and this prescription was added. Make sure you understand how and when to take each.   torsemide 20 MG tablet Commonly known as: DEMADEX Place 2 tablets (40 mg total) into feeding tube daily. Start taking on: March 11, 2020 What changed:  medication  strength how much to take how to take this when to take this   Uloric 80 MG Tabs Generic drug: Febuxostat Take 80 mg by mouth in the morning.        Disposition and follow-up:   [name] was discharged from [facility] in [place] condition.  At the hospital follow up visit please address:  Intraventricular Hemorrhage Encephalopathy: On discharge patient was able to intermittently follow commands (fluctuates day to day). He is occasionally able to answer questions with one word answers but is mostly aphasic. This is likely his new baseline per neurology. Of note patient is also blind (this is not new).   Acute Hypercarbic and Hypoxemic Respiratory Failure, Aspiration PNA: Hospital course complicated by aspiration PNA and unfortunately required intubation from 6/17 - 6/24. Following extubation he required BiPAP intermittently. Ultimately weaned from BiPAP and discharged on HFNC and home CPAP QHS alone.   GOC: Patient's daughter [name] is HCPOA. Family had multiple meetings between [place] (primary), palliative care, and critical care. Ultimately family decided against reintubation and trach - he is now DNI. But still full scope of care and full code other than intubation. He had a PEG tube placed on 7/19. Please continue to address [name], recommend outpatient palliative care follow up for continued discussion.   2.  Labs / imaging needed at time of follow-up: BMP  3.  Pending labs/ test needing follow-up: none  Follow-up Appointments:  none  Hospital Course by problem list:  1) Intraventricular Hemorrhage, Encephalopathy: Admitted 6/11 with small bilateral [name] required intermitted cleviprex drip in the ICU for BP control. Course complicated by encephalopathy and aspiration PNA requiring intubation. He has remained aphasic since extubation with fluctuating mental status. Work up for other causes including infection and seizures were unremarkable. Unable to obtain MRI  brain due to pacemaker however multiple repeat head CTs remained stable. Neurology was consulted for neuro prognosis on 02/20/20 who felt his aphasia / prolonged encephalopathy were likely sequela of his bleed and given lack of improvement after 1 month that he was unlikely to recover to the point of being independent or managing his own affairs. Neurology recommended palliative approach and no further work up. After extensive [place] conversation, family decided on PEG tube placement and continue full scope of care. He is now partial code with DNI but otherwise full code. Of note patient is also blind at baseline. On discharge patient was able to intermittently follow commands. He is occasionally able to answer questions with one word answers but is mostly aphasic. This is likely his new baseline.    2) Acute Hypercarbic and Hypoxemic Respiratory Failure, Aspiration PNA: Hospital course complicated by aspiration PNA and unfortunately required intubation from 6/17 - 6/24. Following extubation he required BiPAP intermittently. He was slowly weaned to BiPAP QHS alone with HFNC during the day, and eventually discontinued off BiPAP altogether on 7/22. Repeat ABG the following day was reassuring without hypercarbia. He  has remained stable on HFNC and will be discharged on his home CPAP QHS alone.    3) AKI on CKD Stage IV Developed worsening renal function while in the ICU with azotemia felt to be multifactorial per nephrology due to catabolic state, tube feeds, and aggressive diuresis. Deemed to be a poor dialysis candidate.  Improved with changing TF to low osmolar load and repletion of free water deficit. Volume status was managed with IV lasix and he was discharged on a stable regimen of torsemide 40 mg daily. His creatinine remained at baseline.   4) Hypernatremia: Related to poor po intake, improved with free water per tube.   5) HTN: Initially required cleviprex drip in ICU. Stabilized on amlodipine 10 mg,  coreg 12.5 mg BID, hydralazine 50 mg q6h, and torsemide 40 mg per tube.    6) Type II DM: Managed on Levemir 40 units BID, novolog 6 units q4h, and SSI-resistant q4h.   7) GOC: Patient's daughter [name] is HCPOA. Family had multiple meetings between [place] (primary), palliative care, and critical care. Ultimately family decided against reintubation and trach - he is now DNI. But still full scope of care and full code other than intubation. He had a PEG tube placed on 7/19. Please continue to address [name], recommend outpatient palliative care follow up for continued discussion.     Discharge Vitals:   BP (!) 130/69 (BP Location: Right Arm)   Pulse 63   Temp 98.5 F (36.9 C)   Resp 22   Ht 5\' 9"  (1.753 m) Comment: measured x 3  Wt (!) 141.2 kg   SpO2 98%   BMI 45.97 kg/m   Pertinent Labs, Studies, and Procedures:  BMP Latest Ref Rng & Units 03/10/2020 03/09/2020 03/08/2020  Glucose 70 - 99 mg/dL 128(H) 149(H) 117(H)  BUN 8 - 23 mg/dL 56(H) 57(H) 58(H)  Creatinine 0.61 - 1.24 mg/dL 1.79(H) 1.77(H) 1.74(H)  BUN/Creat Ratio 10 - 24 - - -  Sodium 135 - 145 mmol/L 135 133(L) 131(L)  Potassium 3.5 - 5.1 mmol/L 4.1 3.9 3.6  Chloride 98 - 111 mmol/L 97(L) 97(L) 95(L)  CO2 22 - 32 mmol/L 26 28 27   Calcium 8.9 - 10.3 mg/dL 9.2 9.1 9.2   CBC Latest Ref Rng & Units 03/07/2020 02/29/2020 02/26/2020  WBC 4.0 - 10.5 K/uL - 9.9 10.1  Hemoglobin 13.0 - 17.0 g/dL 10.5(L) 10.3(L) 9.8(L)  Hematocrit 39 - 52 % 31.0(L) 38.8(L) 34.9(L)  Platelets 150 - 400 K/uL - 130(L) 115(L)   CT Head WO Contrast 6/11: There is hemorrhage in the dependent portion of each lateral ventricle of uncertain etiology. No brain parenchymal hemorrhage elsewhere noted. No mass. No extra-axial fluid.   CT Head WO Contrast 6/12: Unchanged intraventricular blood layering within the occipital horns of the lateral ventricles.   CT Head WO Contrast 6/13: Increased but still small volume intraventricular hemorrhage layering in the  occipital horns of the lateral ventricles. Borderline increase in ventriculomegaly, recommend follow-up.   CT Head WO Contrast 6/14: Stable hemorrhage layering in each occipital horn of the lateral ventricles, stable. No hemorrhage appreciable. There is atrophy with extensive supratentorial small vessel disease, stable. No acute infarct evident. There is extensive arterial vascular calcification at multiple sites. There is mucosal thickening in multiple ethmoid air cells.   CT Head WO Contrast 6/16 Stable intraventricular hemorrhage. Small amount of subarachnoid hemorrhage on the left also stable. No new hemorrhage or infarct. Mild ventricular enlargement is stable.   CT Head WO Contrast 6/17:  Stable examination with small amount of bilateral intraventricular hemorrhage and subarachnoid overlying the left parietal lobe. No new Hemorrhage. Findings consistent with age related atrophy and chronic small vessel ischemia   CT Chest WO Contrast 7/1: Dependent atelectasis and trace pleural effusions. Cardiomegaly without pulmonary edema. Aortic Atherosclerosis    CT Head WO Contrast 7/2: Evidence of advanced chronic small vessel disease but no CT findings of acute or evolving anoxic injury. Trace residual intraventricular and subarachnoid hemorrhage, decreased since last month. No new intracranial hemorrhage. No intracranial mass effect.   CT Abdomen WO Contrast 7/14: Anatomy should be amendable for percutaneous gastrostomy tube placement. No acute abnormality in the abdomen. Bilateral renal structures are suggestive for hemorrhagic and/or proteinaceous cysts. An enlarged hemorrhagic or proteinaceous cyst in the posterior left kidney measuring up to 9 mm. Moderate gallbladder distention with cholelithiasis. Stable appearance of the gallbladder. Aortic Atherosclerosis   PEG tube placement: Was done by IR on 7/19   Discharge Instructions: Discharge Instructions     (HEART FAILURE PATIENTS) Call  MD:  Anytime you have any of the following symptoms: 1) 3 pound weight gain in 24 hours or 5 pounds in 1 week 2) shortness of breath, with or without a dry hacking cough 3) swelling in the hands, feet or stomach 4) if you have to sleep on extra pillows at night in order to breathe.   Complete by: As directed    Call MD for:   Complete by: As directed    Call MD for:  difficulty breathing, headache or visual disturbances   Complete by: As directed    Call MD for:  extreme fatigue   Complete by: As directed    Call MD for:  hives   Complete by: As directed    Call MD for:  persistant dizziness or light-headedness   Complete by: As directed    Call MD for:  persistant nausea and vomiting   Complete by: As directed    Call MD for:  redness, tenderness, or signs of infection (pain, swelling, redness, odor or green/yellow discharge around incision site)   Complete by: As directed    Call MD for:  severe uncontrolled pain   Complete by: As directed    Call MD for:  temperature >100.4   Complete by: As directed    Diet - low sodium heart healthy   Complete by: As directed    Discharge instructions   Complete by: As directed    Dear Mr. Hocevar, It was a pleasure taking care of you. You were admitted for an intracranial bleed.   Increase activity slowly   Complete by: As directed    No wound care   Complete by: As directed        Signed: Briant Cedar, MD 03/10/2020, 10:07 AM   Pager: [phone]

## 2020-03-03 NOTE — Procedures (Signed)
Interventional Radiology Procedure Note  Procedure: fluoro 20 fr Gtube  Complications: None  Estimated Blood Loss: min  Findings: Confirmed in the stomach

## 2020-03-03 NOTE — Progress Notes (Signed)
SLP Cancellation Note  Patient Details Name: Sloan Takagi MRN: [id number] DOB: [birth year]   Cancelled treatment:       Reason Eval/Treat Not Completed: Patient at procedure or test/unavailable   Jani Ploeger, Katherene Ponto 03/03/2020, 9:46 AM

## 2020-03-03 NOTE — Progress Notes (Signed)
Attempted to call daughter, [name], to inform her that we are bringing patient down for Gtube placement. No answer.

## 2020-03-04 LAB — GLUCOSE, CAPILLARY
Glucose-Capillary: 108 mg/dL — ABNORMAL HIGH (ref 70–99)
Glucose-Capillary: 126 mg/dL — ABNORMAL HIGH (ref 70–99)
Glucose-Capillary: 143 mg/dL — ABNORMAL HIGH (ref 70–99)
Glucose-Capillary: 55 mg/dL — ABNORMAL LOW (ref 70–99)
Glucose-Capillary: 63 mg/dL — ABNORMAL LOW (ref 70–99)
Glucose-Capillary: 88 mg/dL (ref 70–99)
Glucose-Capillary: 90 mg/dL (ref 70–99)

## 2020-03-04 LAB — BASIC METABOLIC PANEL
Anion gap: 9 (ref 5–15)
BUN: 81 mg/dL — ABNORMAL HIGH (ref 8–23)
CO2: 30 mmol/L (ref 22–32)
Calcium: 9.6 mg/dL (ref 8.9–10.3)
Chloride: 114 mmol/L — ABNORMAL HIGH (ref 98–111)
Creatinine, Ser: 2.23 mg/dL — ABNORMAL HIGH (ref 0.61–1.24)
GFR calc Af Amer: 33 mL/min — ABNORMAL LOW (ref >60)
GFR calc non Af Amer: 29 mL/min — ABNORMAL LOW (ref >60)
Glucose, Bld: 106 mg/dL — ABNORMAL HIGH (ref 70–99)
Potassium: 3.4 mmol/L — ABNORMAL LOW (ref 3.5–5.1)
Sodium: 153 mmol/L — ABNORMAL HIGH (ref 135–145)

## 2020-03-04 LAB — SARS CORONAVIRUS 2 (TAT 6-24 HRS): SARS Coronavirus 2: NEGATIVE

## 2020-03-04 MED ORDER — DEXTROSE 50 % IV SOLN
12.5000 g | INTRAVENOUS | Status: AC
  Administered 2020-03-04: 12.5 g via INTRAVENOUS

## 2020-03-04 MED ORDER — INSULIN DETEMIR 100 UNIT/ML ~~LOC~~ SOLN
40.0000 [IU] | Freq: Two times a day (BID) | SUBCUTANEOUS | Status: DC
  Administered 2020-03-05 – 2020-03-10 (×11): 40 [IU] via SUBCUTANEOUS
  Filled 2020-03-04 (×15): qty 0.4

## 2020-03-04 MED ORDER — DEXTROSE 50 % IV SOLN
INTRAVENOUS | Status: AC
  Filled 2020-03-04: qty 50

## 2020-03-04 MED ORDER — DEXTROSE 50 % IV SOLN
INTRAVENOUS | Status: AC
Start: 2020-03-04 — End: 2020-03-04
  Administered 2020-03-04: 50 mL
  Filled 2020-03-04: qty 50

## 2020-03-04 NOTE — Progress Notes (Signed)
Subjective: Interviewed patient at bedside. Patient a little more responsive today. He was able to tell us that he was feeling okay. He reported no pain. He did not respond to any other questions.  Objective:  Vital signs in last 24 hours: Vitals:   03/04/20 0005 03/04/20 0353 03/04/20 0410 03/04/20 0416  BP: 134/76 (!) 152/55 (!) 155/65   Pulse: 65 65 62   Resp: 18 19 13    Temp: 99.6 F (37.6 C) 99.5 F (37.5 C) 99.5 F (37.5 C)   TempSrc: Axillary Axillary Axillary   SpO2: 98% 98% 98%   Weight:    132.3 kg  Height:       Physical Exam Vitals and nursing note reviewed.  Constitutional:      General: He is not in acute distress.    Appearance: He is obese. He is not ill-appearing or toxic-appearing.  HENT:     Head: Normocephalic and atraumatic.  Cardiovascular:     Rate and Rhythm: Normal rate and regular rhythm.     Pulses: Normal pulses.          Radial pulses are 2+ on the right side and 2+ on the left side.       Dorsalis pedis pulses are 2+ on the right side and 2+ on the left side.     Heart sounds: Normal heart sounds, S1 normal and S2 normal. No murmur heard.   Pulmonary:     Effort: No respiratory distress.     Breath sounds: No wheezing.  Abdominal:     Palpations: Abdomen is soft. There is no mass.     Tenderness: There is no abdominal tenderness.  Musculoskeletal:     Right lower leg: 1+ Edema present.     Left lower leg: 1+ Edema present.      Assessment/Plan:  Principal Problem:   ICH (intracerebral hemorrhage) (HCC) Active Problems:   Intraventricular hemorrhage (HCC)   Encephalopathy   Goals of care, counseling/discussion   Palliative care by specialist   Chronic respiratory failure with hypoxia and hypercapnia (HCC)   Acute on chronic respiratory failure with hypoxia and hypercapnia (HCC)   Aspiration into airway   Acute respiratory failure (HCC)   Hypervolemia   Endotracheal tube present  This is a 74 year old male with a history of  atrial fibrillation not on anticoagulation 2/2 rectal bleed, CM,, CAD, HTN, legally blind, DM, HTN, HLD and morbid obesity who initially presented with n/v headaches and recent falls and found to have ICH. Developed aspiration pneumonia requiring intubation. Hospital course now complicated by continued encephalopathy with poor prognosis.    Goals of Care: Continues to be addressed with the family.Currently DNI. DNR status still being discussed. Palliative care following, appreciate assistance. Overall has a poor prognosis. Family agreeable to PEG tube placement. PEG tube placed 7/19. -Continue to address with family -PEG tube was successfully placed 7/19   Encephalopathy:  Multifactorial due to respiratory failure, ICH, Uremia. Repeat CT (7/2) showed no acute changes. 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 No reversible causes have been identified. Can answer a few questions before not answering further questions, cannot follow commands. -Neurowasconsulted for assessmentrecommends palliative care as they felt that patient would not recover to a point were he could be independent or able to manage his own affairs -Currently has mittens on to prevent him removing any equipment -No further changes, continues to be encephalopathic   T2DM: Getting tube feeds right now. Due to an episode of hypoglycemia  over night have decreased Levemir -ReduceLevemir to 40 units twice daily. -Continue sliding scaleinsulin   Acute Hypercarbic and Hypoxemic Respiratory Failure Aspiration PNA- finished antibiotics  Was Extubated on 6/24. Pulmonology has recommended against re-intubation.Continues to [place].  BiPAP QHS should not be an issue now that CoreTrak is removed and PEG tub has been placed.Stable O2 sats will continue to monitor.Currently on 4 L/min HFNC. He has increased work of breathing and is at a high risk of developing respiratory distress.    AKI on CKD Stage  IV: Per Nephrology consult during admission dialysis is not recommended.BUN was 81 and Cr was 2.23 -Continue monitoring Kidney function -Continuetorsemide 40 mg per tube   Hypernatremia: Na 153 this yesterday, the same as yesterday. Night nurse was unsure if he could receive free water per his PEG tube while on BiPAP. Free water restarted and will discuss with night nurse to continue. Will recheck Na in the morning. -Free water per tube 400cc q2 hr per PEG tube  -Recheck BMP tomorrow    Hypertension: WithCoreTrakremoved due to successful PEG tub placement will now use that. -Resumed PO medsper tube.pressurehave remainedstable will continue to monitor   Prior to Admission Living Arrangement:home Anticipated Discharge Location: SNF Barriers to Discharge: complex medical issues Dispo: Anticipated discharge in approximately 2-3 day(s).   Briant Cedar, MD 03/04/2020, 5:47 AM Pager: [phone] After 5pm on weekdays and 1pm on weekends: On Call pager [phone]

## 2020-03-04 NOTE — Progress Notes (Signed)
Hypoglycemic Event  CBG: 63  Treatment: 25g Dextrose 50%  Symptoms: none  Follow-up CBG: Time: 0423 CBG Result: 143  Possible Reasons for Event: feeding tube stopped due to BIPAP and new peg tube placement.     Eusebio Friendly

## 2020-03-04 NOTE — NC FL2 (Signed)
[place] LEVEL OF CARE SCREENING TOOL     IDENTIFICATION  Patient Name: [name] Birthdate: 06/08/1946 Sex: male Admission Date (Current Location): 01/25/2020  [facility] and [place] Number:  Herbalist and Address:  [facility] [place] [address] [place], [place] [address]      Provider Number: [id number]  Attending Physician Name and Address:  Velna Ochs, MD  Relative Name and Phone Number:  [name], daughter    Current Level of Care: Hospital Recommended Level of Care: [place] Prior Approval Number:    Date Approved/Denied:   PASRR Number: [id number]  Discharge Plan: SNF    Current Diagnoses: Patient Active Problem List   Diagnosis Date Noted   Endotracheal tube present    Hypervolemia    Acute respiratory failure ([place])    Aspiration into airway    Chronic respiratory failure with hypoxia and hypercapnia ([place])    Acute on chronic respiratory failure with hypoxia and hypercapnia ([place])    Goals of care, counseling/discussion    Palliative care by specialist    Acute diastolic CHF (congestive heart failure) ([place]) 04/28/2019   Acute on chronic congestive heart failure ([place])    Heart failure ([place]) 08/31/2018   Paroxysmal atrial tachycardia ([place]) 05/12/2018   Aortic valve stenosis, nonrheumatic 05/12/2018   Diabetes mellitus type 2 in obese ([place]) 05/12/2018   Chronic diastolic heart failure ([place]) 11/11/2017   Hypernatremia 08/15/2017   AKI (acute kidney injury) ([place]) 08/15/2017   Acute lower UTI 08/15/2017   Coarse tremors 08/15/2017   Amiodarone pulmonary toxicity    Cellulitis    Encephalopathy    Acute pulmonary edema (HCC)    Acute respiratory failure with hypoxia ([place]) 06/13/2017   SSS (sick sinus syndrome) ([place]) 05/19/2016   Cough 08/20/2015   Morbid obesity ([place]) 07/09/2015   Cardiac mass 10/04/2013   Intraventricular hemorrhage ([place]) 10/02/2013    CVA (cerebral infarction) 10/01/2013   History of intracranial hemorrhage 10/01/2013   ICH (intracerebral hemorrhage) ([place]) 10/01/2013   Right knee pain 09/07/2013   Stage 3b chronic kidney disease 09/07/2013   Orchitis 09/05/2013   Unspecified protein-calorie malnutrition ([place]) 09/05/2013   Sepsis ([name]) 08/27/2013   Trichomonas infection 08/27/2013   Epididymo-orchitis, acute 08/27/2013   PAF (paroxysmal atrial fibrillation) ([place]) 06/24/2013   OSA (obstructive sleep apnea) 05/04/2011   Shortness of breath 03/17/2011   Depression 10/23/2010   Degenerative joint disease    Cardiomyopathy, hypertensive ([place])    Coronary artery disease involving native coronary artery of native heart without angina pectoris    Pacemaker    Incarcerated ventral hernia    COPD (chronic obstructive pulmonary disease) ([place]) 11/28/2009   Diabetes mellitus due to underlying condition, controlled, with stage 3 chronic kidney disease, with long-term current use of insulin ([place]) 07/20/2006   Hypercholesterolemia 07/20/2006   GOUT 07/20/2006   Macular degeneration (senile) of retina 07/20/2006   Essential hypertension 07/20/2006   BENIGN PROSTATIC HYPERTROPHY, HX OF 07/20/2006    Orientation RESPIRATION BLADDER Height & Weight     Self  O2 (Nasal Cannula) Incontinent, External catheter Weight: 291 lb 10.7 oz (132.3 kg) Height:  5\' 9"  (175.3 cm) (measured x 3)  BEHAVIORAL SYMPTOMS/MOOD NEUROLOGICAL BOWEL NUTRITION STATUS      Incontinent Feeding tube (PEG)  AMBULATORY STATUS COMMUNICATION OF NEEDS Skin   Total Care Does not communicate Other (Comment) (Upper G Tube placement)  Personal Care Assistance Level of Assistance  Bathing, Feeding, Dressing, Total care Bathing Assistance: Maximum assistance Feeding assistance: Maximum assistance Dressing Assistance: Maximum assistance Total Care Assistance: Maximum assistance   Functional Limitations Info              SPECIAL CARE FACTORS FREQUENCY  PT (By licensed PT), OT (By licensed OT)     PT Frequency: 5X a week OT Frequency: 5x a week            Contractures Contractures Info: Not present    Additional Factors Info  Code Status, Allergies Code Status Info: PARTIAL Allergies Info: NKA           Current Medications (03/04/2020):  This is the current hospital active medication list Current Facility-Administered Medications  Medication Dose Route Frequency Provider Last Rate Last Admin   0.9 %  sodium chloride infusion   Intravenous PRN Kipp Brood, MD   Stopped at 02/04/20 0129   acetaminophen (TYLENOL) 160 MG/5ML solution 650 mg  650 mg Per Tube Q6H PRN Kipp Brood, MD   650 mg at 03/03/20 1024   amLODipine (NORVASC) tablet 10 mg  10 mg Per Tube Daily Asencion Noble, MD   10 mg at 03/04/20 0815   carvedilol (COREG) tablet 12.5 mg  12.5 mg Per Tube BID WC Asencion Noble, MD   12.5 mg at 03/04/20 0815   chlorhexidine (PERIDEX) 0.12 % solution 15 mL  15 mL Mouth Rinse BID Kipp Brood, MD   15 mL at 03/04/20 0815   Chlorhexidine Gluconate Cloth 2 % PADS 6 each  6 each Topical Daily Kipp Brood, MD   6 each at 03/03/20 2125   docusate (COLACE) 50 MG/5ML liquid 100 mg  100 mg Per Tube BID Kipp Brood, MD   100 mg at 03/04/20 0814   famotidine (PEPCID) tablet 20 mg  20 mg Per Tube Daily Agarwala, Einar Grad, MD   20 mg at 03/04/20 0815   feeding supplement (GLUCERNA 1.2 CAL) liquid 1,000 mL  1,000 mL Per Tube Continuous Joni Reining C, DO 65 mL/hr at 03/03/20 1037 1,000 mL at 03/03/20 1037   feeding supplement (PROSource TF) liquid 45 mL  45 mL Per Tube TID Joni Reining C, DO   45 mL at 03/04/20 0813   free water 400 mL  400 mL Per Tube Q2H Agyei, Obed K, MD   400 mL at 03/04/20 1214   hydrALAZINE (APRESOLINE) injection 10 mg  10 mg Intravenous Q4H PRN Kipp Brood, MD   10 mg at 02/23/20 2231   hydrALAZINE (APRESOLINE) tablet 50 mg  50 mg Per Tube  Q6H Asencion Noble, MD   50 mg at 03/04/20 1222   insulin aspart (novoLOG) injection 0-20 Units  0-20 Units Subcutaneous Q4H Kipp Brood, MD   3 Units at 03/03/20 1300   insulin aspart (novoLOG) injection 6 Units  6 Units Subcutaneous Q4H Mosetta Anis, MD   6 Units at 03/03/20 2003   insulin detemir (LEVEMIR) injection 40 Units  40 Units Subcutaneous BID Sherry Ruffing, Marissa M, MD       ipratropium-albuterol (DUONEB) 0.5-2.5 (3) MG/3ML nebulizer solution 3 mL  3 mL Nebulization BID Velna Ochs, MD   3 mL at 03/04/20 0805   MEDLINE mouth rinse  15 mL Mouth Rinse q12n4p Agarwala, Einar Grad, MD   15 mL at 03/04/20 1225   polyethylene glycol (MIRALAX / GLYCOLAX) packet 17 g  17 g Per NG tube Daily PRN Kipp Brood, MD  polyethylene glycol (MIRALAX / GLYCOLAX) packet 17 g  17 g Per Tube Daily Agarwala, Ravi, MD   17 g at 03/04/20 0814   sodium chloride flush (NS) 0.9 % injection 10-40 mL  10-40 mL Intracatheter Q12H Mosetta Anis, MD   10 mL at 03/04/20 0953   sodium chloride flush (NS) 0.9 % injection 10-40 mL  10-40 mL Intracatheter PRN Mosetta Anis, MD       torsemide [facility]) tablet 40 mg  40 mg Per Tube Daily Asencion Noble, MD   40 mg at 03/04/20 7998     Discharge Medications: Please see discharge summary for a list of discharge medications.  Relevant Imaging Results:  Relevant Lab Results:   Additional Information SSN [id number]  Neysa Hotter [place], [place]

## 2020-03-04 NOTE — TOC Progression Note (Addendum)
Transition of Care [facility]) - Progression Note    Patient Details  Name: [name] MRN: [id number] Date of Birth: 1945-10-11  Transition of Care [facility]) CM/SW [place], [place] Phone Number: 03/04/2020, 11:34 AM  Clinical Narrative:    11:45a CSW spoke with patient's daughter [name] on discharge plan. [name] expressed understanding that patient will need SNF placement, but is unsure if it will be short-term or long-term at this time. [name] stated she would like more insight from the MD before deciding on the time frame. She provided permission for patient to be faxed to [facility] as long-term placement for now. [name] does not want [facility] or [facility] considered at this time and understands other SNFs will be out of network.   CSW spoke with SW [place] and provided an update on patient's needs. SW Doroteo Bradford stated she will update team and continue to follow for discharge planning.   11:34a CSW called patient's daughter [name] who has been the point of contact for patient's care. CSW left a voicemail, awaiting a call back.  CSW contacted [facility] to speak with patient's Education officer, museum, previously stated as Darrelyn Hillock. CSW was transferred to the voicemail of Doroteo Bradford and left a message. Awaiting a call back.  Expected Discharge Plan: Skilled Nursing Facility Barriers to Discharge: Continued Medical Work up  Expected Discharge Plan and Services Expected Discharge Plan: [place]                                               Social Determinants of Health (SDOH) Interventions    Readmission Risk Interventions Readmission Risk Prevention Plan 05/08/2019  [place] or Home Care Consult Complete  Social Work Consult for [place] Planning/Counseling Complete  Medication Review Press photographer) Complete  Some recent data might be hidden

## 2020-03-04 NOTE — Progress Notes (Signed)
Pt taken off BiPAP at 0802 and placed on 6L [place]. Pt tolerating well at this time with saturations of 98%. RT will continue to monitor.

## 2020-03-04 NOTE — Progress Notes (Signed)
Referring Physician(s): Dr Ulyess Blossom  Supervising Physician: Arne Cleveland  Patient Status:  [facility] - In-pt  Chief Complaint:  Dysphagia   Subjective:  Percutaneous gastric tube placed in IR 7/19 Somewhat alert today Not painful per pt  Allergies: Patient has no known allergies.  Medications: Prior to Admission medications   Medication Sig Start Date End Date Taking? Authorizing Provider  acetaminophen (TYLENOL) 650 MG CR tablet Take 1,300 mg by mouth 2 (two) times daily.    Yes [provider]  amLODipine (NORVASC) 10 MG tablet Take 10 mg by mouth in the morning. Reported on 12/09/2015   Yes [provider]  amoxicillin (AMOXIL) 500 MG capsule Take 4 capsules by mouth one hour prior to dental work Patient taking differently: Take 2,000 mg by mouth See admin instructions. Take 4 capsules (2,000 mg) by mouth one hour prior to dental work 06/07/19  Yes Eileen Stanford, PA-C  antiseptic oral rinse (BIOTENE) LIQD 15 mLs by Mouth Rinse route 3 (three) times daily as needed for dry mouth.    Yes [provider]  aspirin EC 81 MG tablet Take 1 tablet (81 mg total) by mouth daily. 01/19/19  Yes Clegg, Amy D, NP  Cholecalciferol (VITAMIN D-3) 25 MCG (1000 UT) CAPS Take 1,000 Units by mouth in the morning.   Yes [provider]  citalopram (CELEXA) 20 MG tablet Take 20 mg by mouth in the morning.    Yes [provider]  famotidine (PEPCID) 20 MG tablet Take 20 mg by mouth in the morning.    Yes [provider]  Febuxostat (ULORIC) 80 MG TABS Take 80 mg by mouth in the morning.    Yes [provider]  gabapentin (NEURONTIN) 300 MG capsule Take 300 mg by mouth 2 (two) times daily.   Yes [provider]  hydrALAZINE (APRESOLINE) 100 MG tablet Take 1 tablet (100 mg total) by mouth 3 (three) times daily. 01/16/20  Yes Larey Dresser, MD  insulin glargine (SEMGLEE) 100 UNIT/ML Solostar Pen Inject 100 Units into the skin  at bedtime.   Yes [provider]  Liraglutide (VICTOZA) 18 MG/3ML SOPN Inject 1.8 mg into the skin daily.   Yes [provider]  loperamide (IMODIUM A-D) 2 MG tablet Take 1 mg by mouth 3 (three) times daily as needed for diarrhea or loose stools.   Yes [provider]  Menthol, Topical Analgesic, (BIOFREEZE EX) Apply 1 application topically 2 (two) times daily as needed (knee/shoulder pain).    Yes [provider]  metolazone (ZAROXOLYN) 2.5 MG tablet Take 2.5 mg by mouth daily.   Yes [provider]  metoprolol succinate (TOPROL-XL) 50 MG 24 hr tablet Take 1 tablet (50 mg total) by mouth daily. Take with or immediately following a meal. 03/21/18  Yes Larey Dresser, MD  OXYGEN Inhale 2 L/min into the lungs continuous.   Yes [provider]  polyethylene glycol (MIRALAX / GLYCOLAX) packet Take 17 g by mouth daily as needed (for hard stools- MIX AS DIRECTED AND DRINK).    Yes [provider]  potassium chloride SA (KLOR-CON) 20 MEQ tablet Take 80 mEq by mouth 2 (two) times daily.   Yes [provider]  rosuvastatin (CRESTOR) 20 MG tablet Take 20 mg by mouth at bedtime.    Yes [provider]  sennosides-docusate sodium (SENOKOT-S) 8.6-50 MG tablet Take 2 tablets by mouth 2 (two) times daily.    Yes [provider]  Skin Protectants, Misc. (EUCERIN) cream Apply 1 application topically as needed for dry skin (to affected areas of the body).    Yes [provider]  torsemide (DEMADEX) 100 MG tablet Take 100 mg by mouth 2 (two) times daily.   Yes [provider]     Vital Signs: BP (!) 139/52 (BP Location: Left Leg)    Pulse 79    Temp 98.9 F (37.2 C) (Axillary)    Resp 18    Ht 5\' 9"  (1.753 m) Comment: measured x 3   Wt 291 lb 10.7 oz (132.3 kg)    SpO2 99%    BMI 43.07 kg/m   Physical Exam Skin:    General: Skin is warm.     Comments: Site is clean and dry NT no bleeding In use No  leakage     Imaging: IR GASTROSTOMY TUBE MOD SED  Result Date: 03/03/2020 INDICATION: Acute cephalopathy, dysphagia EXAM: FLUOROSCOPIC 20 FRENCH PULL-THROUGH GASTROSTOMY Date:  03/03/2020 03/03/2020 9:24 am Radiologist:  M. Daryll Brod, MD Guidance:  Fluoroscopic MEDICATIONS: Ancef 2 g; Antibiotics were administered within 1 hour of the procedure. Glucagon 0.5 mg IV ANESTHESIA/SEDATION: Versed 1.5 mg IV; Fentanyl 75 mcg IV Moderate Sedation Time:  19 minutes The patient was continuously monitored during the procedure by the interventional radiology nurse under my direct supervision. CONTRAST:  29mL OMNIPAQUE IOHEXOL 300 MG/ML SOLN - administered into the gastric lumen. FLUOROSCOPY TIME:  Fluoroscopy Time: 8 minutes 54 seconds (421 mGy). COMPLICATIONS: None immediate. PROCEDURE: Informed consent was obtained from the patient's family following explanation of the procedure, risks, benefits and alternatives. The patient understands, agrees and consents for the procedure. All questions were addressed. A time out was performed. Maximal barrier sterile technique utilized including caps, mask, sterile gowns, sterile gloves, large sterile drape, hand hygiene, and betadine prep. The left upper quadrant was sterilely prepped and draped. An oral gastric catheter was inserted into the stomach under fluoroscopy. The existing nasogastric feeding tube was removed. Air was injected into the stomach for insufflation and visualization under fluoroscopy. The air distended stomach was confirmed beneath the anterior abdominal wall in the frontal and lateral projections. Under sterile conditions and local anesthesia, a 18 gauge trocar needle was utilized to access the stomach percutaneously beneath the left subcostal margin. Needle position was confirmed within the stomach under biplane fluoroscopy. Contrast injection confirmed position also. A single T tack was deployed for gastropexy. Over an Amplatz guide wire, a 9-French sheath  was inserted into the stomach. A snare device was utilized to capture the oral gastric catheter. The snare device was pulled retrograde from the stomach up the esophagus and out the oropharynx. The 20-French pull-through gastrostomy was connected to the snare device and pulled antegrade through the oropharynx down the esophagus into the stomach and then through the percutaneous tract external to the patient. The gastrostomy was assembled externally. Contrast injection confirms position in the stomach. Images were obtained for documentation. The patient tolerated procedure well. No immediate complication. IMPRESSION: Fluoroscopic insertion of a 20-French "pull-through" gastrostomy. Electronically Signed   By: Jerilynn Mages.  Shick M.D.   On: 03/03/2020 09:43    Labs:  CBC: Recent Labs    02/19/20 1056 02/22/20 0804 02/26/20 1004 02/29/20 0813  WBC 10.5 13.6* 10.1 9.9  HGB 10.4* 10.2* 9.8* 10.3*  HCT 36.8* 35.7* 34.9* 38.8*  PLT 154 135* 115* 130*    COAGS: Recent Labs    05/07/19 0932 05/10/19 0204 02/29/20 0813  INR 1.2 1.2 1.2  APTT 39*  --   --     BMP: Recent Labs    03/01/20 1642 03/02/20 0715 03/03/20 0401 03/04/20 0618  NA 159* 157* 153* 153*  K 3.7 3.6 3.9 3.4*  CL 118* 116* 114* 114*  CO2 31 29 28 30   GLUCOSE 189* 88 98 106*  BUN 80* 84* 79* 81*  CALCIUM 10.0 10.0 9.6 9.6  CREATININE 2.13* 2.17* 2.18* 2.23*  GFRNONAA 30* 30* 29* 29*  GFRAA 35* 34* 34* 33*    LIVER FUNCTION TESTS: Recent Labs    01/30/20 0715 01/31/20 0706 02/15/20 0650 02/29/20 0813  BILITOT 0.5 0.3 0.7 0.4  AST 55* 35 19 16  ALT 98* 71* 32 17  ALKPHOS 105 86 78 54  PROT 7.6 6.9 7.4 7.7  ALBUMIN 2.8* 2.2* 2.8* 2.6*    Assessment and Plan:  Dysphagia Perc G tube placed in IR yesterday May use   Electronically Signed: Lavonia Drafts, PA-C 03/04/2020, 9:37 AM   I spent a total of 15 Minutes at the the patient's bedside AND on the patient's hospital floor or unit, greater than 50% of  which was counseling/coordinating care for G tube

## 2020-03-04 NOTE — Progress Notes (Signed)
Hypoglycemic Event  CBG: 55  Treatment: 12.5g Dextrose 50% solution  Symptoms: none  Follow-up CBG: Time: 0043 CBG Result: 90  Possible Reasons for Event: tube feeding stopped due to BIPAP and new peg tube placement.  Comments/MD notified: Wynetta Emery MD    [name]

## 2020-03-05 LAB — GLUCOSE, CAPILLARY
Glucose-Capillary: 101 mg/dL — ABNORMAL HIGH (ref 70–99)
Glucose-Capillary: 104 mg/dL — ABNORMAL HIGH (ref 70–99)
Glucose-Capillary: 106 mg/dL — ABNORMAL HIGH (ref 70–99)
Glucose-Capillary: 125 mg/dL — ABNORMAL HIGH (ref 70–99)
Glucose-Capillary: 127 mg/dL — ABNORMAL HIGH (ref 70–99)
Glucose-Capillary: 138 mg/dL — ABNORMAL HIGH (ref 70–99)
Glucose-Capillary: 97 mg/dL (ref 70–99)

## 2020-03-05 LAB — BASIC METABOLIC PANEL
Anion gap: 11 (ref 5–15)
BUN: 72 mg/dL — ABNORMAL HIGH (ref 8–23)
CO2: 29 mmol/L (ref 22–32)
Calcium: 9.4 mg/dL (ref 8.9–10.3)
Chloride: 108 mmol/L (ref 98–111)
Creatinine, Ser: 2.05 mg/dL — ABNORMAL HIGH (ref 0.61–1.24)
GFR calc Af Amer: 37 mL/min — ABNORMAL LOW (ref >60)
GFR calc non Af Amer: 32 mL/min — ABNORMAL LOW (ref >60)
Glucose, Bld: 109 mg/dL — ABNORMAL HIGH (ref 70–99)
Potassium: 3.5 mmol/L (ref 3.5–5.1)
Sodium: 148 mmol/L — ABNORMAL HIGH (ref 135–145)

## 2020-03-05 NOTE — Progress Notes (Addendum)
Subjective: Interviewed patient at bedside. Was unresponsive to questions. Unable to follow commands, was moving his Left arm to scratch his forehead. When leaving the room patient responded to have a nice day with "you to."  Objective:  Vital signs in last 24 hours: Vitals:   03/04/20 2242 03/05/20 0005 03/05/20 0006 03/05/20 0426  BP:  (!) 152/61 (!) 152/61 (!) 169/64  Pulse:  65  71  Resp:  18  16  Temp:  98.5 F (36.9 C)  99.2 F (37.3 C)  TempSrc:  Axillary  Axillary  SpO2: 100% 99%  100%  Weight:      Height:       Physical Exam Vitals and nursing note reviewed.  Constitutional:      General: He is not in acute distress.    Appearance: He is not ill-appearing or toxic-appearing.  HENT:     Head: Normocephalic and atraumatic.  Cardiovascular:     Rate and Rhythm: Normal rate and regular rhythm.     Pulses: Normal pulses.          Radial pulses are 2+ on the right side and 2+ on the left side.       Dorsalis pedis pulses are 2+ on the right side and 2+ on the left side.     Heart sounds: Normal heart sounds, S1 normal and S2 normal. No murmur heard.   Pulmonary:     Effort: Pulmonary effort is normal. No respiratory distress.     Breath sounds: Normal breath sounds. No wheezing.  Abdominal:     Palpations: There is no mass.     Tenderness: There is no abdominal tenderness.  Musculoskeletal:     Right lower leg: 1+ Edema present.     Left lower leg: 1+ Edema present.  Neurological:     Mental Status: He is lethargic, disoriented and confused.      Assessment/Plan:  Principal Problem:   ICH (intracerebral hemorrhage) (HCC) Active Problems:   Intraventricular hemorrhage (HCC)   Encephalopathy   Goals of care, counseling/discussion   Palliative care by specialist   Chronic respiratory failure with hypoxia and hypercapnia (HCC)   Acute on chronic respiratory failure with hypoxia and hypercapnia (HCC)   Aspiration into airway   Acute respiratory failure  (HCC)   Hypervolemia   Endotracheal tube present   [name] is a 74 yr old male with a history of atrial fibrillation not on anticoagulation 2/2 rectal bleed, CM,, CAD, HTN, legally blind, DM, HTN, HLD and morbid obesity who initially presented with n/v headaches and recent falls and found to have ICH. Developed aspiration pneumonia requiring intubation. Hospital course now complicated by continued encephalopathy with poor prognosis.      Goals of Care:  Continues to be addressed with the family. Currently DNI. DNR status still being discussed. Palliative care following, appreciate assistance. Overall has a poor prognosis. Family agreeable to PEG tube placement. PEG tube placed 7/19. Once stable will be able to discharge to SNF. -Continue to address with family -PEG tube was successfully placed 7/19 -Recommend continued Palliative Care meetings outpatient once discharged     Encephalopathy:  Multifactorial due to respiratory failure, ICH, Uremia. Repeat CT (7/2) showed no acute changes. 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 No reversible causes have been identified. Can answer a few questions inconsistently -Neuro was consulted for assessment recommends palliative care as they felt that patient would not recover to a point were he could be independent or able to  manage his own affairs -Currently has mittens on to prevent him removing any equipment -No further changes, continues to be encephalopathic     T2DM: Getting tube feeds right now. Due to an episode of hypoglycemia over night 7/19 decreased Levemir on 7/20. Over night of 7/21 no hypoglycemic episodes. Will keep Levemir at current dose. -Continue Levemir to 40 units twice daily.  -Continue sliding scale insulin     Acute Hypercarbic and Hypoxemic Respiratory Failure  Aspiration PNA - finished antibiotics  Was Extubated on 6/24. Pulmonology has recommended against re-intubation. Continues to require HFNC.  BiPAP QHS should  not be an issue now that CoreTrak is removed and PEG tub has been placed. Stable O2 sats will continue to monitor. Currently on 6 L/min HFNC. When entering the room he had knocked his nasal canula out but was still sating around 91%.      AKI on CKD Stage IV: Per Nephrology consult during admission dialysis is not recommended. BUN was 72 and Cr was 2.05. Slight improvement in Kidney function with free water 400cc q2 -Continue monitoring Kidney function -Continue torsemide 40 mg per tube     Hypernatremia: Improving -Continue free water per tube 400cc q2 hr per PEG tube  -Recheck BMP tomorrow      Hypertension:  With CoreTrak removed due to successful PEG tub placement will now use that. -Resumed PO meds per tube. pressure have remained stable will continue to monitor     Prior to Admission Living Arrangement:home Anticipated Discharge Location: SNF Barriers to Discharge: electrolyte issues Dispo: Anticipated discharge in approximately 2-3 day(s).    Ryan Cedar, MD 03/05/2020, 6:16 AM Pager: [phone] After 5pm on weekdays and 1pm on weekends: On Call pager [phone]

## 2020-03-05 NOTE — Progress Notes (Signed)
Nutrition Follow-up  DOCUMENTATION CODES:   Obesity unspecified  INTERVENTION:  Continue Glucerna 1.2 formula via PEG at goal rate of 65 ml/hr.   Provide 45 ml Prosource TF TID per tube.   Free water flushes of 400 ml every 2 hours per tube. (MD to adjust as appropriate)  Tube feeding to provide 2172 kcal, 127 grams of protein, and 6064 ml free water.  NUTRITION DIAGNOSIS:   Inadequate oral intake related to inability to eat as evidenced by NPO status; ongoing  GOAL:   Patient will meet greater than or equal to 90% of their needs; met with TF  MONITOR:   Weight trends, Labs, I & O's, TF tolerance  REASON FOR ASSESSMENT:   Consult Enteral/tube feeding initiation and management  ASSESSMENT:   Pt with a PMH significant for Afib, cardiomyopathy, PPM, coronary disease, HTN, legally blind, T2DM, HTN, and HLD presented with N/V, headache, recent fall, recent syncope and AMS. Pt admitted with ICH. Extubated 6/24.   PEG placed 7/19. Pt has been tolerating his tube feeds well with no difficulties. RD to continue with current feeding orders. Labs and medications reviewed.   Diet Order:   Diet Order    None      EDUCATION NEEDS:   Not appropriate for education at this time  Skin:  Skin Assessment: Reviewed RN Assessment Skin Integrity Issues::  (pressure injury: nose) Other: amputation all L toes  Last BM:  7/21  Height:   Ht Readings from Last 1 Encounters:  01/31/20 '5\' 9"'$  (1.753 m)    Weight:   Wt Readings from Last 1 Encounters:  03/04/20 132.3 kg    Ideal Body Weight:  72.7 kg  BMI:  Body mass index is 43.07 kg/m.  Estimated Nutritional Needs:   Kcal:  1900-2100  Protein:  120-140 grams  Fluid:  2 L/day  Corrin Parker, MS, RD, LDN RD pager number/after hours weekend pager number on Amion.

## 2020-03-05 NOTE — Progress Notes (Signed)
Pt not placed on bipap tonight do to PT confusion an hand restraints on pt. PT asleep and sats are 97 at this time

## 2020-03-05 NOTE — Progress Notes (Signed)
°  Speech Language Pathology Treatment: Dysphagia  Patient Details Name: [name] MRN: [id number] DOB: 05-15-1946 Today's Date: 03/05/2020 Time: 0277-4128 SLP Time Calculation (min) (ACUTE ONLY): 12 min  Assessment / Plan / Recommendation Clinical Impression  Treatment primarily focused on improving oral hygiene as a pre-swallowing measure. He has a thick coating throughout his mouth, with oral care limited by condition of oral hygiene as well as [name] mentation as he automatically bites down on any brush or swab put in his mouth. SLP was able to remove a large, thick layer of dried secretions that had been adhered to his hard palate. Small amounts of ice chips and water were utilized in the process with intermittent throat clearing noted. Recommend frequent and thorough oral care to see if we can clear his oral cavity better, using a few ice chips at a time during oral care.    HPI HPI: [name] is a 74 year old with prior intracranial hemorrhage and blindness who presented with 3 days of headaches Head CT increased but still small volume intraventricular hemorrhage layering in the occipital horns of the lateral ventricles." Unable to perform MRI. BSE performed 10/04/19 recommended NPO, 01/27/20 NPO and unable to initiate po's due to respiratory status with decline and intubated 6/17-6/24, BiPAP 6/25 and discontinued during the day. [place] disloged 7/5 and pulled 7/6. Per MD notes "[name]'s encephalopathy is improving somewhat but overall prognosis remains very poor".      SLP Plan  Continue with current plan of care       Recommendations  Diet recommendations: NPO Medication Administration: Via alternative means                Oral Care Recommendations: Oral care QID Follow up Recommendations: Skilled Nursing facility SLP Visit Diagnosis: Dysphagia, unspecified (R13.10) Plan: Continue with current plan of care       GO                Osie Bond., M.A. [facility] Pager [phone] Office [phone]  03/05/2020, 10:27 AM

## 2020-03-06 LAB — BASIC METABOLIC PANEL
Anion gap: 9 (ref 5–15)
BUN: 64 mg/dL — ABNORMAL HIGH (ref 8–23)
CO2: 28 mmol/L (ref 22–32)
Calcium: 9 mg/dL (ref 8.9–10.3)
Chloride: 102 mmol/L (ref 98–111)
Creatinine, Ser: 1.88 mg/dL — ABNORMAL HIGH (ref 0.61–1.24)
GFR calc Af Amer: 41 mL/min — ABNORMAL LOW (ref >60)
GFR calc non Af Amer: 35 mL/min — ABNORMAL LOW (ref >60)
Glucose, Bld: 130 mg/dL — ABNORMAL HIGH (ref 70–99)
Potassium: 3.4 mmol/L — ABNORMAL LOW (ref 3.5–5.1)
Sodium: 139 mmol/L (ref 135–145)

## 2020-03-06 LAB — GLUCOSE, CAPILLARY
Glucose-Capillary: 118 mg/dL — ABNORMAL HIGH (ref 70–99)
Glucose-Capillary: 121 mg/dL — ABNORMAL HIGH (ref 70–99)
Glucose-Capillary: 123 mg/dL — ABNORMAL HIGH (ref 70–99)
Glucose-Capillary: 125 mg/dL — ABNORMAL HIGH (ref 70–99)
Glucose-Capillary: 133 mg/dL — ABNORMAL HIGH (ref 70–99)
Glucose-Capillary: 138 mg/dL — ABNORMAL HIGH (ref 70–99)
Glucose-Capillary: 162 mg/dL — ABNORMAL HIGH (ref 70–99)

## 2020-03-06 NOTE — Progress Notes (Signed)
Spoke with nurse about the contraindications of placing Pt on bipap. Agreed to put him on BiPAP, with RN closely monitoring.

## 2020-03-06 NOTE — Progress Notes (Signed)
Pt's mitts removed as pt must be mitt-free for 24 hrs to be accepted to SNF. Please continue to leave off if at all possible.   Justice Rocher, RN

## 2020-03-06 NOTE — Progress Notes (Signed)
Internal Medicine Attending Note:  I have seen and evaluated this patient and I have discussed the plan of care with the house staff. Please see their note for complete details. I concur with their findings with the following corrections:  Hypernatremia has resolved with free water per tube. Respiratory status and oxygenation seem to be slowly improving. We have changed his BiPAP order to PRN. Continue with HFNC for now. Otherwise renal function is stable and likely at his new baseline. Encephalopathy is waxing and waning but overall remains aphasic aside from occasional one word answers. He is inconsistently able to follow commands. At this point he is medically stable for discharge to SNF.   Velna Ochs, MD 03/06/2020, 8:54 PM

## 2020-03-06 NOTE — Progress Notes (Signed)
Subjective: Interviewed patient at bedside. When asked how he was doing he responded with alright. He did not respond to further questions.   Objective:  Vital signs in last 24 hours: Vitals:   03/06/20 0321 03/06/20 0408 03/06/20 0500 03/06/20 0539  BP: (!) 160/67   (!) 169/62  Pulse: 67     Resp: 16     Temp:  99 F (37.2 C)    TempSrc:  Axillary    SpO2:      Weight:   132.3 kg   Height:       Physical Exam Vitals and nursing note reviewed.  Constitutional:      Appearance: He is obese.  HENT:     Head: Normocephalic and atraumatic.  Cardiovascular:     Rate and Rhythm: Normal rate and regular rhythm.     Pulses: Normal pulses.          Radial pulses are 2+ on the right side and 2+ on the left side.       Dorsalis pedis pulses are 2+ on the right side and 2+ on the left side.     Heart sounds: Normal heart sounds, S1 normal and S2 normal. No murmur heard.   Musculoskeletal:     Right lower leg: 1+ Edema present.     Left lower leg: 1+ Edema present.  Neurological:     Mental Status: He is disoriented and confused.      Assessment/Plan:  Principal Problem:   ICH (intracerebral hemorrhage) (HCC) Active Problems:   Intraventricular hemorrhage (HCC)   Acute encephalopathy   Goals of care, counseling/discussion   Palliative care by specialist   Chronic respiratory failure with hypoxia and hypercapnia (HCC)   Acute on chronic respiratory failure with hypoxia and hypercapnia (HCC)   Aspiration into airway   Acute respiratory failure (HCC)   Hypervolemia   Endotracheal tube present  [name] is a 74 yr old male with a history of atrial fibrillation not on anticoagulation 2/2 rectal bleed, CM,, CAD, HTN, legally blind, DM, HTN, HLD and morbid obesity who initially presented with n/v headaches and recent falls and found to have ICH. Developed aspiration pneumonia requiring intubation. Hospital course now complicated by continued encephalopathy with poor  prognosis.    Goals of Care: Continues to be addressed with the family.Currently DNI. DNR status still being discussed. Palliative care following, appreciate assistance. Overall has a poor prognosis. Family agreeable to PEG tube placement.PEG tubeplaced 7/19. Once stable will be able to discharge to SNF. -Continue to address with family -PEG tubewas successfully placed7/19 -Recommend continued Palliative Care meetings outpatient once discharged   Encephalopathy:  Multifactorial due to respiratory failure, ICH, Uremia. Repeat CT (7/2) showed no acute changes. 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 No reversible causes have been identified.Can answer a few questions inconsistently -Neurowasconsulted for assessmentrecommends palliative care as they felt that patient would not recover to a point were he could be independent or able to manage his own affairs -Currently has mittens on to prevent him removing any equipment -No further changes, continues to be encephalopathic   T2DM: Getting tube feeds right now.Due to an episode of hypoglycemia over night 7/19 decreased Levemir on 7/20. Over night of 7/21 no hypoglycemic episodes values around 90-130. Will keep Levemir at current dose. -ContinueLevemirto 40 units twice daily. -Continue sliding scaleinsulin   Acute Hypercarbic and Hypoxemic Respiratory Failure Aspiration PNA- finished antibiotics  Was Extubated on 6/24. Pulmonology has recommended against re-intubation.Continues to [place]. BiPAP  QHS should not be an issue now that CoreTrak is removed and PEG tub has been placed.Stable O2 sats will continue to monitor.Currently on 6 L/min HFNC. When entering the room he had knocked his nasal canula out but was still sating around 91%.   AKI on CKD Stage IV: Per Nephrology consult during admission dialysis is not recommended.BUNwas72and Cr was2.05. Slight improvement in Kidney function with free water  400cc q2 -Continue monitoring Kidney function -Continuetorsemide 40 mg per tube   Hypernatremia: Improving yesterday was 148, today was 139 -Continue free water per tube400cc q2 hrperPEG tube  -RecheckBMP tomorrow   Hypertension: WithCoreTrakremoved due to successful PEG tub placement will now use that. -Resumed PO medsper tube.pressurehave remainedstable will continue to monitor   Prior to Admission Living Arrangement:home Anticipated Discharge Location:SNF Barriers to Discharge:electrolyte issues Dispo: Anticipated discharge in approximately2-3day(s).   Briant Cedar, MD 03/06/2020, 6:09 AM Pager: [phone] After 5pm on weekdays and 1pm on weekends: On Call pager [phone]

## 2020-03-07 LAB — GLUCOSE, CAPILLARY
Glucose-Capillary: 132 mg/dL — ABNORMAL HIGH (ref 70–99)
Glucose-Capillary: 125 mg/dL — ABNORMAL HIGH (ref 70–99)
Glucose-Capillary: 137 mg/dL — ABNORMAL HIGH (ref 70–99)
Glucose-Capillary: 132 mg/dL — ABNORMAL HIGH (ref 70–99)
Glucose-Capillary: 126 mg/dL — ABNORMAL HIGH (ref 70–99)

## 2020-03-07 LAB — POCT I-STAT 7, (LYTES, BLD GAS, ICA,H+H)
Acid-Base Excess: 6 mmol/L — ABNORMAL HIGH (ref 0.0–2.0)
Bicarbonate: 31.6 mmol/L — ABNORMAL HIGH (ref 20.0–28.0)
Calcium, Ion: 1.24 mmol/L (ref 1.15–1.40)
HCT: 31 % — ABNORMAL LOW (ref 39.0–52.0)
Hemoglobin: 10.5 g/dL — ABNORMAL LOW (ref 13.0–17.0)
O2 Saturation: 94 %
Patient temperature: 97.8
Potassium: 3.6 mmol/L (ref 3.5–5.1)
Sodium: 136 mmol/L (ref 135–145)
TCO2: 33 mmol/L — ABNORMAL HIGH (ref 22–32)
pCO2 arterial: 47.8 mmHg (ref 32.0–48.0)
pH, Arterial: 7.427 (ref 7.350–7.450)
pO2, Arterial: 69 mmHg — ABNORMAL LOW (ref 83.0–108.0)

## 2020-03-07 LAB — BASIC METABOLIC PANEL
Anion gap: 8 (ref 5–15)
BUN: 58 mg/dL — ABNORMAL HIGH (ref 8–23)
CO2: 26 mmol/L (ref 22–32)
Calcium: 9.2 mg/dL (ref 8.9–10.3)
Chloride: 100 mmol/L (ref 98–111)
Creatinine, Ser: 1.71 mg/dL — ABNORMAL HIGH (ref 0.61–1.24)
GFR calc Af Amer: 46 mL/min — ABNORMAL LOW (ref >60)
GFR calc non Af Amer: 39 mL/min — ABNORMAL LOW (ref >60)
Glucose, Bld: 142 mg/dL — ABNORMAL HIGH (ref 70–99)
Potassium: 3.6 mmol/L (ref 3.5–5.1)
Sodium: 134 mmol/L — ABNORMAL LOW (ref 135–145)

## 2020-03-07 LAB — SARS CORONAVIRUS 2 (TAT 6-24 HRS): SARS Coronavirus 2: NEGATIVE

## 2020-03-07 MED ORDER — IPRATROPIUM-ALBUTEROL 0.5-2.5 (3) MG/3ML IN SOLN: 3.0000 mL | Freq: Two times a day (BID) | RESPIRATORY_TRACT | Status: DC | PRN

## 2020-03-07 NOTE — Progress Notes (Addendum)
Subjective: Interviewed patient at bedside. Was able to answer a few questions- he was doing ok, no pain. But was unresponsive to further questioning.  Objective:  Vital signs in last 24 hours: Vitals:   03/07/20 0312 03/07/20 0400 03/07/20 0500 03/07/20 0547  BP: (!) 160/77   (!) 155/64  Pulse: 61     Resp: (!) 25 20    Temp: 97.8 F (36.6 C)     TempSrc: Axillary     SpO2: 99%     Weight:   (!) 132.4 kg   Height:       Physical Exam Vitals and nursing note reviewed.  Constitutional:      General: He is not in acute distress.    Appearance: He is obese. He is not ill-appearing or toxic-appearing.  HENT:     Head: Normocephalic and atraumatic.  Cardiovascular:     Rate and Rhythm: Normal rate and regular rhythm.     Pulses: Normal pulses.          Radial pulses are 2+ on the right side and 2+ on the left side.       Dorsalis pedis pulses are 2+ on the right side and 2+ on the left side.     Heart sounds: Normal heart sounds, S1 normal and S2 normal. No murmur heard.   Pulmonary:     Effort: No respiratory distress.     Breath sounds: No wheezing.  Abdominal:     General: There is distension.     Palpations: Abdomen is soft. There is no mass.     Tenderness: There is no abdominal tenderness.  Musculoskeletal:     Right lower leg: 1+ Edema present.     Left lower leg: 1+ Edema present.      Assessment/Plan:  Principal Problem:   ICH (intracerebral hemorrhage) (HCC) Active Problems:   Intraventricular hemorrhage (HCC)   Acute encephalopathy   Goals of care, counseling/discussion   Palliative care by specialist   Chronic respiratory failure with hypoxia and hypercapnia (HCC)   Acute on chronic respiratory failure with hypoxia and hypercapnia (HCC)   Aspiration into airway   Acute respiratory failure (HCC)   Hypervolemia   Endotracheal tube present  [name] a 74 yr old male with a history of atrial fibrillation not on anticoagulation 2/2 rectal  bleed, CM,, CAD, HTN, legally blind, DM, HTN, HLD and morbid obesity who initially presented with n/v headaches and recent falls and found to have ICH. Developed aspiration pneumonia requiring intubation. Hospital course now complicated by continued encephalopathy with poor prognosis.    Goals of Care: Continues to be addressed with the family.Currently DNI. DNR status still being discussed. Palliative care following, appreciate assistance. Overall has a poor prognosis. Family agreeable to PEG tube placement.PEG tubeplaced 7/19. As hypernatremia has been corrected and mittens have been removed as of 1:30 PM yesterday he is ready and medically stable to discharge to SNF. -PEG tubewas successfully placed7/19 -Able to be mitten free for 24 hrs -Is medically stable -Recommend continued Palliative Care meetings outpatient once discharged   Encephalopathy:  Multifactorial due to respiratory failure, ICH, Uremia. Repeat CT (7/2) showed no acute changes. 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 No reversible causes have been identified.Can answer a few questionsinconsistently. Mittens have been removed because they need to be off for 24 hrs before able to discharge to SNF. -Neurowasconsulted for assessmentrecommends palliative care as they felt that patient would not recover to a point were he could  be independent or able to manage his own affairs -Had his mittens removed to prepare him for discharge, no issues over night -Has had some improvement over last 2 weeks but continues to be encephalopathic   T2DM: Getting tube feeds right now.Due to an episode of hypoglycemia over night7/19decreased Levemiron 7/20. Over night of 7/21 no hypoglycemic episodes values around 90-130. Will keep Levemir at current dose. -ContinueLevemirto 40 units twice daily. -Continue sliding scaleinsulin   Acute Hypercarbic and Hypoxemic Respiratory Failure Aspiration PNA- finished antibiotics    Was Extubated on 6/24. Pulmonology has recommended against re-intubation.Patient has been stable without using BiPAP at night. Nasal canula has maintained stable O2 sats.Currently on6L/min HFNC. Will get a repeat ABG, if he does not need BiPAP will discharge today with his home CPAP for night. If he does still need it will order it and will discharge when the facility gets it. -Await ABG results   AKI on CKD Stage IV: Per Nephrology consult during admission dialysis is not recommended. YesterdayBUNwas64and Cr was1.88. Slight improvement in Kidney function with free water 400cc q2. Today BUN was 58 and Creatinine was 1.71. -Continue monitoring Kidney function -Continuetorsemide 40 mg per tube   Hypernatremia: Na was 134 today. -Continue freewater per tube400cc q2 hrperPEG tube     Hypertension: WithCoreTrakremoved due to successful PEG tub placement will now use that. -Resumed PO medsper tube.pressurehave remainedstable will continue to monitor   Prior to Admission Living Arrangement:home Anticipated Discharge Location:SNF Barriers to Discharge: BiPAP vs CPAP needs Dispo: Anticipated discharge today.   Briant Cedar, MD 03/07/2020, 6:07 AM Pager: [phone] After 5pm on weekdays and 1pm on weekends: On Call pager [phone]

## 2020-03-07 NOTE — TOC Progression Note (Addendum)
Transition of Care [facility]) - Progression Note    Patient Details  Name: [name] MRN: [id number] Date of Birth: 12-Feb-1946  [facility] CM/SW [place], [place] Phone Number: 03/07/2020, 4:05 PM  Clinical Narrative:     CSW informed by [facility]- per MD patient does not need bipap and will discharge w/cpap only.   CSW informed [facility] and [facility] , patient no longer in need of bipap.  CSW attempted to call patient's daughter, [name] to provide update- unable to lave voice message.  CSW was informed they can not admit patient until Monday- too complicated(tube feeds,etc to admit over the weekend).   Thurmond Butts, MSW, [place] Clinical Social Worker   Expected Discharge Plan: Skilled Nursing Facility Barriers to Discharge: Continued Medical Work up  Expected Discharge Plan and Services Expected Discharge Plan: [place]                                               Social Determinants of Health (SDOH) Interventions    Readmission Risk Interventions Readmission Risk Prevention Plan 05/08/2019  [place] or Home Care Consult Complete  Social Work Consult for [place] Planning/Counseling Complete  Medication Review [employer] photographer) Complete  Some recent data might be hidden

## 2020-03-08 LAB — GLUCOSE, CAPILLARY
Glucose-Capillary: 111 mg/dL — ABNORMAL HIGH (ref 70–99)
Glucose-Capillary: 133 mg/dL — ABNORMAL HIGH (ref 70–99)
Glucose-Capillary: 133 mg/dL — ABNORMAL HIGH (ref 70–99)
Glucose-Capillary: 134 mg/dL — ABNORMAL HIGH (ref 70–99)
Glucose-Capillary: 143 mg/dL — ABNORMAL HIGH (ref 70–99)
Glucose-Capillary: 149 mg/dL — ABNORMAL HIGH (ref 70–99)
Glucose-Capillary: 151 mg/dL — ABNORMAL HIGH (ref 70–99)

## 2020-03-08 LAB — BASIC METABOLIC PANEL
Anion gap: 9 (ref 5–15)
BUN: 58 mg/dL — ABNORMAL HIGH (ref 8–23)
CO2: 27 mmol/L (ref 22–32)
Calcium: 9.2 mg/dL (ref 8.9–10.3)
Chloride: 95 mmol/L — ABNORMAL LOW (ref 98–111)
Creatinine, Ser: 1.74 mg/dL — ABNORMAL HIGH (ref 0.61–1.24)
GFR calc Af Amer: 45 mL/min — ABNORMAL LOW (ref >60)
GFR calc non Af Amer: 39 mL/min — ABNORMAL LOW (ref >60)
Glucose, Bld: 117 mg/dL — ABNORMAL HIGH (ref 70–99)
Potassium: 3.6 mmol/L (ref 3.5–5.1)
Sodium: 131 mmol/L — ABNORMAL LOW (ref 135–145)

## 2020-03-08 MED ORDER — FREE WATER
400.0000 mL | Status: DC
  Administered 2020-03-08 – 2020-03-10 (×13): 400 mL

## 2020-03-08 NOTE — Plan of Care (Signed)
  Problem: Education: Goal: Knowledge of General Education information will improve Description: Including pain rating scale, medication(s)/side effects and non-pharmacologic comfort measures Outcome: Progressing   Problem: Health Behavior/Discharge Planning: Goal: Ability to manage health-related needs will improve Outcome: Progressing   Problem: Clinical Measurements: Goal: Ability to maintain clinical measurements within normal limits will improve Outcome: Progressing Goal: Will remain free from infection Outcome: Progressing Goal: Diagnostic test results will improve Outcome: Progressing Goal: Respiratory complications will improve Outcome: Progressing Goal: Cardiovascular complication will be avoided Outcome: Progressing   Problem: Activity: Goal: Risk for activity intolerance will decrease Outcome: Progressing   Problem: Nutrition: Goal: Adequate nutrition will be maintained Outcome: Progressing   Problem: Coping: Goal: Level of anxiety will decrease Outcome: Progressing   Problem: Elimination: Goal: Will not experience complications related to bowel motility Outcome: Progressing Goal: Will not experience complications related to urinary retention Outcome: Progressing   Problem: Pain Managment: Goal: General experience of comfort will improve Outcome: Progressing   Problem: Safety: Goal: Ability to remain free from injury will improve Outcome: Progressing   Problem: Skin Integrity: Goal: Risk for impaired skin integrity will decrease Outcome: Progressing   Problem: Education: Goal: Knowledge of disease or condition will improve Outcome: Progressing Goal: Knowledge of secondary prevention will improve Outcome: Progressing Goal: Knowledge of patient specific risk factors addressed and post discharge goals established will improve Outcome: Progressing   Problem: Intracerebral Hemorrhage Tissue Perfusion: Goal: Complications of Intracerebral Hemorrhage  will be minimized Outcome: Progressing

## 2020-03-08 NOTE — Progress Notes (Addendum)
Subjective: Patient interviewed at bedside. Patient did not answer any questions but when leaving the room responded with "you too" when we said have a nice day.  Objective:  Vital signs in last 24 hours: Vitals:   03/07/20 2032 03/08/20 0000 03/08/20 0328 03/08/20 0337  BP: (!) 155/65 (!) 153/62  (!) 120/53  Pulse: 61 64  59  Resp: 18 18  18   Temp: 99.1 F (37.3 C) 99.4 F (37.4 C) 98.7 F (37.1 C) 98.7 F (37.1 C)  TempSrc: Axillary Axillary Axillary Axillary  SpO2: 96% 97%    Weight:      Height:       Physical Exam Vitals and nursing note reviewed.  Constitutional:      General: He is not in acute distress.    Appearance: He is not ill-appearing or toxic-appearing.  HENT:     Head: Normocephalic and atraumatic.  Cardiovascular:     Rate and Rhythm: Normal rate and regular rhythm.     Pulses: Normal pulses.          Radial pulses are 2+ on the right side and 2+ on the left side.       Dorsalis pedis pulses are 2+ on the right side and 2+ on the left side.     Heart sounds: Normal heart sounds, S1 normal and S2 normal. No murmur heard.   Pulmonary:     Effort: Accessory muscle usage present. No respiratory distress.     Breath sounds: Normal breath sounds. No wheezing.  Abdominal:     Palpations: Abdomen is soft. There is no mass.     Tenderness: There is no abdominal tenderness.  Musculoskeletal:     Right lower leg: 1+ Edema present.     Left lower leg: 1+ Edema present.  Neurological:     Mental Status: He is disoriented.      Assessment/Plan:  Principal Problem:   ICH (intracerebral hemorrhage) (HCC) Active Problems:   Intraventricular hemorrhage (HCC)   Acute encephalopathy   Goals of care, counseling/discussion   Palliative care by specialist   Chronic respiratory failure with hypoxia and hypercapnia (HCC)   Acute on chronic respiratory failure with hypoxia and hypercapnia (HCC)   Aspiration into airway   Acute respiratory failure (HCC)    Hypervolemia   Endotracheal tube present  [name] a 74 yr old male with a history of atrial fibrillation not on anticoagulation 2/2 rectal bleed, CM,, CAD, HTN, legally blind, DM, HTN, HLD and morbid obesity who initially presented with n/v headaches and recent falls and found to have ICH. Developed aspiration pneumonia requiring intubation. Hospital course now complicated by continued encephalopathy with poor prognosis.    Goals of Care: Continues to be addressed with the family.Currently DNI. DNR status still being discussed. Palliative care following, appreciate assistance. Overall has a poor prognosis. Family agreeable to PEG tube placement.PEG tubeplaced 7/19. As hypernatremia has been corrected and mittens have been removed he is ready and medically stable to discharge to SNF. Have discontinued BiPAP. He will discharge on Monday to [facility]. -PEG tubewas successfully placed7/19 -Able to be mitten free  -Is medically stable -Recommend continued Palliative Care meetings outpatient once discharged   Encephalopathy:  Multifactorial due to respiratory failure, ICH, Uremia. Repeat CT (7/2) showed no acute changes. 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 No reversible causes have been identified.Can answer a few questionsinconsistently. Mittens have been removed because they need to be off for 24 hrs before able to discharge  to SNF. With reassuring ABG BiPAP has been discontinued. He will be transferred to [facility] on Monday. -Neurowasconsulted for assessmentrecommends palliative care as they felt that patient would not recover to a point were he could be independent or able to manage his own affairs -Had his mittens removed to prepare him for discharge, no issues since -Has had some improvement over last 2 weeks but continues to be encephalopathic   T2DM: Getting tube feeds right now.Due to an episode of hypoglycemia over night7/19decreased Levemiron 7/20.  Over night of 7/21 no hypoglycemic episodesvalues around 90-130. Will keep Levemir at current dose. -ContinueLevemirto 40 units twice daily. -Continue sliding scaleinsulin   Acute Hypercarbic and Hypoxemic Respiratory Failure Aspiration PNA- finished antibiotics  Was Extubated on 6/24. Pulmonology has recommended against re-intubation.Patient has been stable without using BiPAP at night. Nasal canula has maintained stable O2 sats.Currently on6L/min HFNC. ABG was reassuring that we can discharge  with his home CPAP for use at night. As long as he continues to do well on nasal canula will discharge Monday. -Continue to monitor   AKI on CKD Stage IV: Per Nephrology consult during admission dialysis is not recommended. YesterdayBUNwas58and Cr was1.71. Due to Na levels will decrease free water to 400cc q4. Today BUN was 58 and Creatinine was 1.74. -Continue monitoring Kidney function -Continuetorsemide 40 mg per tube   Hypernatremia: Na was 131 today. -Decreased freewater per tube400cc q4 hrperPEG tube  -Recheck BMP tomorrow morning   Hypertension: WithCoreTrakremoved due to successful PEG tub placement will now use that. -Resumed PO medsper tube.pressurehave remainedstable will continue to monitor   Prior to Admission Living Arrangement:home Anticipated Discharge Location:SNF Barriers to Discharge: none Dispo: Anticipated discharge Monday 7/26.    Briant Cedar, MD 03/08/2020, 6:04 AM Pager: [phone] After 5pm on weekdays and 1pm on weekends: On Call pager [phone]

## 2020-03-09 DIAGNOSIS — Z931 Gastrostomy status: Secondary | ICD-10-CM

## 2020-03-09 LAB — BASIC METABOLIC PANEL
Anion gap: 8 (ref 5–15)
BUN: 57 mg/dL — ABNORMAL HIGH (ref 8–23)
CO2: 28 mmol/L (ref 22–32)
Calcium: 9.1 mg/dL (ref 8.9–10.3)
Chloride: 97 mmol/L — ABNORMAL LOW (ref 98–111)
Creatinine, Ser: 1.77 mg/dL — ABNORMAL HIGH (ref 0.61–1.24)
GFR calc Af Amer: 44 mL/min — ABNORMAL LOW (ref >60)
GFR calc non Af Amer: 38 mL/min — ABNORMAL LOW (ref >60)
Glucose, Bld: 149 mg/dL — ABNORMAL HIGH (ref 70–99)
Potassium: 3.9 mmol/L (ref 3.5–5.1)
Sodium: 133 mmol/L — ABNORMAL LOW (ref 135–145)

## 2020-03-09 LAB — GLUCOSE, CAPILLARY
Glucose-Capillary: 149 mg/dL — ABNORMAL HIGH (ref 70–99)
Glucose-Capillary: 137 mg/dL — ABNORMAL HIGH (ref 70–99)
Glucose-Capillary: 156 mg/dL — ABNORMAL HIGH (ref 70–99)
Glucose-Capillary: 147 mg/dL — ABNORMAL HIGH (ref 70–99)
Glucose-Capillary: 120 mg/dL — ABNORMAL HIGH (ref 70–99)
Glucose-Capillary: 156 mg/dL — ABNORMAL HIGH (ref 70–99)

## 2020-03-09 LAB — SARS CORONAVIRUS 2 (TAT 6-24 HRS): SARS Coronavirus 2: NEGATIVE

## 2020-03-09 NOTE — Progress Notes (Signed)
Patient has had a yellow MEWs per prior RN

## 2020-03-09 NOTE — Progress Notes (Signed)
Subjective: Interviewed patient at bedside. He stated that he had no pain and was doing alright. He was minimally responsive otherwise.  Objective:  Vital signs in last 24 hours: Vitals:   03/08/20 2000 03/08/20 2350 03/09/20 0400 03/09/20 0500  BP: 122/73 (!) 146/72 (!) 145/68   Pulse: 59 63 61   Resp: (!) 28 (!) 28 (!) 28   Temp: 98.2 F (36.8 C) 98.5 F (36.9 C) 99.4 F (37.4 C)   TempSrc: Axillary Oral Axillary   SpO2: 96% 97% 99%   Weight:    (!) 141.2 kg  Height:       Physical Exam Vitals and nursing note reviewed.  Constitutional:      General: He is not in acute distress.    Appearance: He is obese. He is not ill-appearing or toxic-appearing.  HENT:     Head: Normocephalic and atraumatic.  Cardiovascular:     Rate and Rhythm: Normal rate and regular rhythm.     Pulses: Normal pulses.          Radial pulses are 2+ on the right side and 2+ on the left side.       Dorsalis pedis pulses are 2+ on the right side and 2+ on the left side.     Heart sounds: Normal heart sounds, S1 normal and S2 normal. No murmur heard.   Pulmonary:     Effort: Pulmonary effort is normal. No respiratory distress.     Breath sounds: Normal breath sounds. No wheezing.  Abdominal:     General: There is distension.     Palpations: Abdomen is soft. There is no mass.     Tenderness: There is no abdominal tenderness.  Musculoskeletal:     Right lower leg: 1+ Edema present.     Left lower leg: 1+ Edema present.  Neurological:     Mental Status: He is disoriented.      Assessment/Plan:  Principal Problem:   ICH (intracerebral hemorrhage) (HCC) Active Problems:   Intraventricular hemorrhage (HCC)   Acute encephalopathy   Goals of care, counseling/discussion   Palliative care by specialist   Chronic respiratory failure with hypoxia and hypercapnia (HCC)   Acute on chronic respiratory failure with hypoxia and hypercapnia (HCC)   Aspiration into airway   Acute respiratory failure  (HCC)   Hypervolemia   Endotracheal tube present   [name] a 74 yr old male with a history of atrial fibrillation not on anticoagulation 2/2 rectal bleed, CM,, CAD, HTN, legally blind, DM, HTN, HLD and morbid obesity who initially presented with n/v headaches and recent falls and found to have ICH. Developed aspiration pneumonia requiring intubation. Hospital course now complicated by continued encephalopathy with poor prognosis.    Goals of Care: Continues to be addressed with the family.Currently DNI. DNR status still being discussed. Palliative care following, appreciate assistance. Overall has a poor prognosis. Family agreeable to PEG tube placement.PEG tubeplaced 7/19.As hypernatremia has been corrected and mittens have been removed he is ready and medically stable to discharge to SNF. Have discontinued BiPAP. He will discharge on Monday to [place]. -PEG tubewas successfully placed7/19 -Able to be mitten free  -Is medically stable -Recommend continued Palliative Care meetings outpatient once discharged   Encephalopathy:  Multifactorial due to respiratory failure, ICH, Uremia. Repeat CT (7/2) showed no acute changes. 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 No reversible causes have been identified.Can answer a few questionsinconsistently. Mittens have been removed becausetheyneed to be off for 24 hrs  before able to discharge to SNF. With reassuring ABG BiPAP has been discontinued. He will be transferred to [facility] on Monday. -Neurowasconsulted for assessmentrecommends palliative care as they felt that patient would not recover to a point were he could be independent or able to manage his own affairs -Had his mittens removed to prepare him for discharge, no issues since -Has had some improvement over last 2 weeks butcontinues to be encephalopathic   T2DM: Getting tube feeds right now.Due to an episode of hypoglycemia over night7/19decreased  Levemiron 7/20. Over night of 7/21 no hypoglycemic episodesvalues around 90-130. Will keep Levemir at current dose. -ContinueLevemirto 40 units twice daily. -Continue sliding scaleinsulin   Acute Hypercarbic and Hypoxemic Respiratory Failure Aspiration PNA- finished antibiotics  Was Extubated on 6/24. Pulmonology has recommended against re-intubation.Patient has been stable without using BiPAP at night. Nasal canula has maintained stable O2 sats.Currently on6L/min HFNC. ABG was reassuring that we can discharge  with his home CPAP for use at night. As long as he continues to do well on nasal canula will discharge Monday. -Continue to monitor   AKI on CKD Stage IV: Per Nephrology consult during admission dialysis is not recommended.YesterdayBUNwas58and Cr was1.74. Will keep the decreased free water at 400cc q4. Today BUN was 57 and Creatinine was 1.77. Net out was -2150 cc. -Continue monitoring Kidney function -Continuetorsemide 40 mg per tube   Hypernatremia: Na was 131 today. -Keep freewater per PEG tube to400cc q4 hr -Recheck BMP tomorrow morning   Hypertension: WithCoreTrakremoved due to successful PEG tub placement will now use that. Pressures have remained controlled. -Continue PO medsper tube.   Prior to Admission Living Arrangement:home Anticipated Discharge Location:SNF Barriers to Discharge:none Dispo: Anticipated dischargeMonday 7/26.     Ryan Cedar, MD 03/09/2020, 6:20 AM Pager: [phone] After 5pm on weekdays and 1pm on weekends: On Call pager [phone]

## 2020-03-10 LAB — GLUCOSE, CAPILLARY
Glucose-Capillary: 114 mg/dL — ABNORMAL HIGH (ref 70–99)
Glucose-Capillary: 117 mg/dL — ABNORMAL HIGH (ref 70–99)
Glucose-Capillary: 144 mg/dL — ABNORMAL HIGH (ref 70–99)
Glucose-Capillary: 179 mg/dL — ABNORMAL HIGH (ref 70–99)

## 2020-03-10 LAB — BASIC METABOLIC PANEL
Anion gap: 12 (ref 5–15)
BUN: 56 mg/dL — ABNORMAL HIGH (ref 8–23)
CO2: 26 mmol/L (ref 22–32)
Calcium: 9.2 mg/dL (ref 8.9–10.3)
Chloride: 97 mmol/L — ABNORMAL LOW (ref 98–111)
Creatinine, Ser: 1.79 mg/dL — ABNORMAL HIGH (ref 0.61–1.24)
GFR calc Af Amer: 43 mL/min — ABNORMAL LOW (ref >60)
GFR calc non Af Amer: 37 mL/min — ABNORMAL LOW (ref >60)
Glucose, Bld: 128 mg/dL — ABNORMAL HIGH (ref 70–99)
Potassium: 4.1 mmol/L (ref 3.5–5.1)
Sodium: 135 mmol/L (ref 135–145)

## 2020-03-10 MED ORDER — POLYETHYLENE GLYCOL 3350 17 G PO PACK: 17.0000 g | PACK | Freq: Every day | ORAL | 0 refills | Status: AC

## 2020-03-10 MED ORDER — FREE WATER: 400.0000 mL | 0 refills | Status: DC

## 2020-03-10 MED ORDER — POLYETHYLENE GLYCOL 3350 17 G PO PACK: 17.0000 g | PACK | Freq: Every day | ORAL | 0 refills | Status: AC | PRN

## 2020-03-10 MED ORDER — TORSEMIDE 20 MG PO TABS: 40.0000 mg | ORAL_TABLET | Freq: Every day | ORAL | 0 refills | Status: AC

## 2020-03-10 MED ORDER — PROSOURCE TF PO LIQD: 45.0000 mL | Freq: Three times a day (TID) | ORAL | 0 refills | Status: AC

## 2020-03-10 MED ORDER — HYDRALAZINE HCL 50 MG PO TABS: 50.0000 mg | ORAL_TABLET | Freq: Four times a day (QID) | ORAL | 0 refills | Status: AC

## 2020-03-10 MED ORDER — AMLODIPINE BESYLATE 10 MG PO TABS: 10.0000 mg | ORAL_TABLET | Freq: Every day | ORAL | 0 refills | Status: AC

## 2020-03-10 MED ORDER — INSULIN ASPART 100 UNIT/ML ~~LOC~~ SOLN: 0.0000 [IU] | SUBCUTANEOUS | 0 refills | Status: AC

## 2020-03-10 MED ORDER — GLUCERNA 1.2 CAL PO LIQD: 1000.0000 mL | ORAL | 0 refills | Status: AC

## 2020-03-10 MED ORDER — INSULIN DETEMIR 100 UNIT/ML ~~LOC~~ SOLN: 40.0000 [IU] | Freq: Two times a day (BID) | SUBCUTANEOUS | 0 refills | Status: AC

## 2020-03-10 MED ORDER — DOCUSATE SODIUM 50 MG/5ML PO LIQD: 100.0000 mg | Freq: Two times a day (BID) | ORAL | 0 refills | Status: AC

## 2020-03-10 MED ORDER — CARVEDILOL 12.5 MG PO TABS: 12.5000 mg | ORAL_TABLET | Freq: Two times a day (BID) | ORAL | 0 refills | Status: AC

## 2020-03-10 MED ORDER — INSULIN ASPART 100 UNIT/ML ~~LOC~~ SOLN: 6.0000 [IU] | SUBCUTANEOUS | 0 refills | Status: AC

## 2020-03-10 MED ORDER — FAMOTIDINE 20 MG PO TABS: 20.0000 mg | ORAL_TABLET | Freq: Every day | ORAL | 0 refills | Status: AC

## 2020-03-10 NOTE — Progress Notes (Signed)
[facility] wanted to see how [name] would do without the bipap machine last night and his oxygen stayed above 95% most of the night with 4 Liters of nasal cannula

## 2020-03-10 NOTE — Progress Notes (Signed)
Pt discharged to [facility]. Report called to Olu, Therapist, sports. PIV removed, belongings packed, and paperwork printed off for transport. Pt left unit in stretcher with PTAR.   Justice Rocher, RN

## 2020-03-10 NOTE — Progress Notes (Addendum)
Subjective: Interviewed patient at bedside. He was not responsive to questioning.   Objective:  Vital signs in last 24 hours: Vitals:   03/09/20 2000 03/09/20 2345 03/10/20 0000 03/10/20 0309  BP: 117/70 (!) 147/67 (!) 129/69 (!) 138/68  Pulse: 61 62 59 65  Resp: 20 18 18 20   Temp: 98.7 F (37.1 C) 99.1 F (37.3 C)  98.4 F (36.9 C)  TempSrc: Oral Axillary  Axillary  SpO2: 94% 99%  99%  Weight:      Height:       Physical Exam Vitals and nursing note reviewed.  Constitutional:      General: He is not in acute distress.    Appearance: He is obese. He is not ill-appearing or toxic-appearing.  HENT:     Head: Normocephalic and atraumatic.  Cardiovascular:     Rate and Rhythm: Normal rate and regular rhythm.     Pulses: Normal pulses.          Radial pulses are 2+ on the right side and 2+ on the left side.       Dorsalis pedis pulses are 2+ on the right side and 2+ on the left side.     Heart sounds: Normal heart sounds, S1 normal and S2 normal. No murmur heard.   Pulmonary:     Effort: Accessory muscle usage present.     Breath sounds: Normal breath sounds. No wheezing.  Abdominal:     General: There is distension.     Palpations: Abdomen is soft. There is no mass.     Tenderness: There is no abdominal tenderness.  Musculoskeletal:     Right lower leg: 1+ Edema present.     Left lower leg: 1+ Edema present.  Neurological:     Comments: Did not respond to questions.      Assessment/Plan:  Principal Problem:   ICH (intracerebral hemorrhage) (HCC) Active Problems:   Intraventricular hemorrhage (HCC)   Acute encephalopathy   Goals of care, counseling/discussion   Palliative care by specialist   Chronic respiratory failure with hypoxia and hypercapnia (HCC)   Acute on chronic respiratory failure with hypoxia and hypercapnia (HCC)   Aspiration into airway   Acute respiratory failure (HCC)   Hypervolemia   Endotracheal tube present   PEG (percutaneous  endoscopic gastrostomy) status ([place])   [name] a 74 yr old male with a history of atrial fibrillation not on anticoagulation 2/2 rectal bleed, CM,, CAD, HTN, legally blind, DM, HTN, HLD and morbid obesity who initially presented with n/v headaches and recent falls and found to have [place]. Developed aspiration pneumonia requiring intubation. Hospital course now complicated by continued encephalopathy with poor prognosis.    Goals of Care: Continues to be addressed with the family.Currently DNI. DNR status still being discussed. Palliative care following, appreciate assistance. Overall has a poor prognosis. Family agreeable to PEG tube placement.PEG tubeplaced 7/19.As hypernatremia has been corrected and mittens have been removed he is ready and medically stable to discharge to SNF.Have discontinued BiPAP on 7/23 and not required it since. He is ready for discharge today. -PEG tubewas successfully placed7/19 -Able to be mitten free  -Is medically stable -Recommend continued Palliative Care meetings outpatient once discharged   Encephalopathy:  Multifactorial due to respiratory failure, ICH, Uremia. Repeat CT (7/2) showed no acute changes. 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 No reversible causes have been identified.Can answer a few questionsinconsistently. Mittens have been removed becausetheyneed to be off for 24 hrs before able to  discharge to SNF.With reassuring ABG BiPAP has been discontinued. He will be transferred to [facility] on Monday. -Neurowasconsulted for assessmentrecommends palliative care as they felt that patient would not recover to a point were he could be independent or able to manage his own affairs -Had his mittens removed to prepare him for discharge, no issuessince -Has had some improvement over last 2 weeks butcontinues to be encephalopathic   T2DM: Getting tube feeds right now.Due to an episode of hypoglycemia over  night7/19decreased Levemiron 7/20. Over night of 7/21 no hypoglycemic episodesvalues around 90-130. Will keep Levemir at current dose. -ContinueLevemirto 40 units twice daily. -Continue sliding scaleinsulin   Acute Hypercarbic and Hypoxemic Respiratory Failure Aspiration PNA- finished antibiotics  Was Extubated on 6/24. Pulmonology has recommended against re-intubation.Patient has been stable without using BiPAP at night. Nasal canula has maintained stable O2 sats.Currently on6L/min HFNC. ABG was reassuring that we can dischargewith his home CPAP for use atnight.As long as he continues to do well on nasal canula will discharge Monday. -Continue to monitor   AKI on CKD Stage IV: Per Nephrology consult during admission dialysis is not recommended.YesterdayBUNwas57and Cr was1.77. Will keep the decreased free water at400cc q4. Today BUN was 56 and Creatinine was 1.79. Net out was -2450 cc. -Continue monitoring Kidney function -Continuetorsemide 40 mg per tube   Hypernatremia: Na was 135today. -Keepfreewater per PEG tube to400cc q4hr -Recheck BMP tomorrow morning   Hypertension: WithCoreTrakremoved due to successful PEG tub placement will now use that. Pressures have remained controlled. -Continue PO medsper tube.   Prior to Admission Living Arrangement:home Anticipated Discharge Location:SNF Barriers to Discharge:none Dispo: Anticipated discharge today.   Briant Cedar, MD 03/10/2020, 5:47 AM Pager: [phone] After 5pm on weekdays and 1pm on weekends: On Call pager [phone]

## 2020-03-10 NOTE — TOC Transition Note (Signed)
Transition of Care [facility]) - CM/SW Discharge Note   Patient Details  Name: [name] MRN: [id number] Date of Birth: [birth year]  Transition of Care [facility] CM/SW Contact:  Vinie Sill, [place] Phone Number: 03/10/2020, 1:12 PM   Clinical Narrative:     Patient will DC to: [place] Date: 03/10/2020 Family Notified: [name],daughter Transport [id number]   Per MD patient is ready for discharge. RN, patient, and facility notified of DC. Discharge Summary sent to facility. RN given number for [phone], Room 422-W. Ambulance transport requested for patient.   Clinical Social Worker signing off.  Thurmond Butts, MSW, [place] Clinical Social Worker    Final next level of care: Skilled Nursing Facility Barriers to Discharge: Barriers Resolved   Patient Goals and CMS Choice        Discharge Placement              Patient chooses bed at: [facility] Patient to be transferred to facility by: [place] Name of family member notified: [name], daughter Patient and family notified of of transfer: 03/10/20  Discharge Plan and Services                                     Social Determinants of Health (SDOH) Interventions     Readmission Risk Interventions Readmission Risk Prevention Plan 05/08/2019  [place] or Home Care Consult Complete  Social Work Consult for [place] Planning/Counseling Complete  Medication Review Press photographer) Complete  Some recent data might be hidden

## 2020-03-20 ENCOUNTER — Emergency Department (HOSPITAL_COMMUNITY)
Admission: EM | Admit: 2020-03-20 | Discharge: 2020-03-21 | Disposition: A | Discharge status: Home / Self Care | Payer: Medicare (Managed Care) | Attending: Emergency Medicine | Admitting: Emergency Medicine

## 2020-03-20 DIAGNOSIS — I5032 Chronic diastolic (congestive) heart failure: Secondary | ICD-10-CM | POA: Insufficient documentation

## 2020-03-20 DIAGNOSIS — I130 Hypertensive heart and chronic kidney disease with heart failure and stage 1 through stage 4 chronic kidney disease, or unspecified chronic kidney disease: Secondary | ICD-10-CM | POA: Diagnosis not present

## 2020-03-20 DIAGNOSIS — I2510 Atherosclerotic heart disease of native coronary artery without angina pectoris: Secondary | ICD-10-CM | POA: Diagnosis not present

## 2020-03-20 DIAGNOSIS — Z87891 Personal history of nicotine dependence: Secondary | ICD-10-CM | POA: Insufficient documentation

## 2020-03-20 DIAGNOSIS — N1832 Chronic kidney disease, stage 3b: Secondary | ICD-10-CM | POA: Diagnosis not present

## 2020-03-20 DIAGNOSIS — E1140 Type 2 diabetes mellitus with diabetic neuropathy, unspecified: Secondary | ICD-10-CM | POA: Diagnosis not present

## 2020-03-20 DIAGNOSIS — Z794 Long term (current) use of insulin: Secondary | ICD-10-CM | POA: Insufficient documentation

## 2020-03-20 DIAGNOSIS — Z79899 Other long term (current) drug therapy: Secondary | ICD-10-CM | POA: Diagnosis not present

## 2020-03-20 DIAGNOSIS — Z7982 Long term (current) use of aspirin: Secondary | ICD-10-CM | POA: Diagnosis not present

## 2020-03-20 DIAGNOSIS — H1031 Unspecified acute conjunctivitis, right eye: Secondary | ICD-10-CM | POA: Insufficient documentation

## 2020-03-20 DIAGNOSIS — E1122 Type 2 diabetes mellitus with diabetic chronic kidney disease: Secondary | ICD-10-CM | POA: Diagnosis not present

## 2020-03-20 DIAGNOSIS — H5711 Ocular pain, right eye: Secondary | ICD-10-CM | POA: Diagnosis present

## 2020-03-20 DIAGNOSIS — J449 Chronic obstructive pulmonary disease, unspecified: Secondary | ICD-10-CM | POA: Insufficient documentation

## 2020-03-20 DIAGNOSIS — Z950 Presence of cardiac pacemaker: Secondary | ICD-10-CM | POA: Insufficient documentation

## 2020-03-20 LAB — CBC WITH DIFFERENTIAL/PLATELET
Abs Immature Granulocytes: 0.08 10*3/uL — ABNORMAL HIGH (ref 0.00–0.07)
Basophils Absolute: 0.1 10*3/uL (ref 0.0–0.1)
Basophils Relative: 1 %
Eosinophils Absolute: 0.3 10*3/uL (ref 0.0–0.5)
Eosinophils Relative: 3 %
HCT: 36.7 % — ABNORMAL LOW (ref 39.0–52.0)
Hemoglobin: 10.3 g/dL — ABNORMAL LOW (ref 13.0–17.0)
Immature Granulocytes: 1 %
Lymphocytes Relative: 18 %
Lymphs Abs: 1.7 10*3/uL (ref 0.7–4.0)
MCH: 24.1 pg — ABNORMAL LOW (ref 26.0–34.0)
MCHC: 28.1 g/dL — ABNORMAL LOW (ref 30.0–36.0)
MCV: 85.7 fL (ref 80.0–100.0)
Monocytes Absolute: 1 10*3/uL (ref 0.1–1.0)
Monocytes Relative: 10 %
Neutro Abs: 6.7 10*3/uL (ref 1.7–7.7)
Neutrophils Relative %: 67 %
Platelets: 188 10*3/uL (ref 150–400)
RBC: 4.28 MIL/uL (ref 4.22–5.81)
RDW: 18.1 % — ABNORMAL HIGH (ref 11.5–15.5)
WBC: 9.7 10*3/uL (ref 4.0–10.5)
nRBC: 0 % (ref 0.0–0.2)

## 2020-03-20 LAB — I-STAT ARTERIAL BLOOD GAS, ED
Acid-Base Excess: 9 mmol/L — ABNORMAL HIGH (ref 0.0–2.0)
Bicarbonate: 35.9 mmol/L — ABNORMAL HIGH (ref 20.0–28.0)
Calcium, Ion: 1.28 mmol/L (ref 1.15–1.40)
HCT: 33 % — ABNORMAL LOW (ref 39.0–52.0)
Hemoglobin: 11.2 g/dL — ABNORMAL LOW (ref 13.0–17.0)
O2 Saturation: 94 %
Patient temperature: 97.8
Potassium: 3.8 mmol/L (ref 3.5–5.1)
Sodium: 140 mmol/L (ref 135–145)
TCO2: 38 mmol/L — ABNORMAL HIGH (ref 22–32)
pCO2 arterial: 61.6 mmHg — ABNORMAL HIGH (ref 32.0–48.0)
pH, Arterial: 7.372 (ref 7.350–7.450)
pO2, Arterial: 73 mmHg — ABNORMAL LOW (ref 83.0–108.0)

## 2020-03-20 LAB — BASIC METABOLIC PANEL
Anion gap: 11 (ref 5–15)
BUN: 48 mg/dL — ABNORMAL HIGH (ref 8–23)
CO2: 29 mmol/L (ref 22–32)
Calcium: 9.8 mg/dL (ref 8.9–10.3)
Chloride: 98 mmol/L (ref 98–111)
Creatinine, Ser: 1.88 mg/dL — ABNORMAL HIGH (ref 0.61–1.24)
GFR calc Af Amer: 41 mL/min — ABNORMAL LOW (ref >60)
GFR calc non Af Amer: 35 mL/min — ABNORMAL LOW (ref >60)
Glucose, Bld: 106 mg/dL — ABNORMAL HIGH (ref 70–99)
Potassium: 4 mmol/L (ref 3.5–5.1)
Sodium: 138 mmol/L (ref 135–145)

## 2020-03-20 MED ORDER — ERYTHROMYCIN 5 MG/GM OP OINT
TOPICAL_OINTMENT | OPHTHALMIC | 0 refills | Status: AC
Start: 2020-03-20 — End: ?

## 2020-03-20 MED ORDER — ERYTHROMYCIN 5 MG/GM OP OINT
1.0000 "application " | TOPICAL_OINTMENT | Freq: Once | OPHTHALMIC | Status: AC
  Administered 2020-03-20: 1 via OPHTHALMIC
  Filled 2020-03-20: qty 3.5

## 2020-03-20 NOTE — ED Notes (Signed)
Report attempted at [facility], no answer at this time.

## 2020-03-20 NOTE — ED Triage Notes (Signed)
Pt BIB [facility] from [facility], called for eye problem, on arrival pt was less responsive than normal, hx brain bleed 1 mo ago. 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 LKN 1500. Pacemaker on R side. VSS, 98% on 3L at baseline.

## 2020-03-20 NOTE — ED Notes (Signed)
[facility] after hours  number called pt is on the list unable to give place in line  called by dee

## 2020-03-20 NOTE — ED Provider Notes (Signed)
[facility] EMERGENCY DEPARTMENT Provider Note   CSN: [id number] Arrival date & time: 03/20/20  2002     History Chief Complaint  Patient presents with  . Blood Infection    [name] is a 74 y.o. male.  HPI  89yM brought in from NH for evaluation of painful and red R eye and also felt to be less responsive. Pt just discharged from the hospital last week after being admitted over a month. Had intraventricular hemorrhage/encephalopathy and hospital course complication by hypercarbic and hypoxemic respiratory failure. Partial discharge summary as below:  "During admission neurology recommended palliative approach and no further work up. After extensive [place] conversation, family decided on PEG tube placement and continue full scope of care. He is now partial code with DNI but otherwise full code. Of note patient is also blind at baseline. On discharge patient was able to intermittently follow commands. He is occasionally able to answer questions with one word answers but is mostly aphasic. This is likely his new baseline."  Past Medical History:  Diagnosis Date  . Arthritis    "all over" (06/23/2013)  . Atrial fibrillation ([place])    not Candidate for Coumadin d/t rectal bleeding  . Benign prostatic hypertrophy   . Cardiomyopathy, hypertensive ([place])     diastolic dysfunction by 1937 echo  . Coronary artery disease     nonobstructive. 2 vessel. cardiac catheter in March 2005 showing 60% LAD occlusion, 30% circumflex occlusion.  . Degenerative joint disease     Right knee.  . Diabetes type 2, uncontrolled ([name])   . Diabetic peripheral neuropathy ([place])   . Diabetic ulcer of thigh ([place])    H/O Rt thigh ulcer.  . Exertional shortness of breath    "comes and goes" (06/23/2013)  . GERD (gastroesophageal reflux disease)   . Gout   . Herpes   . Hyperlipidemia   . Hypertension   . Hypotension 09/05/2013  . Incarcerated ventral hernia    H/O. S/P repair ventral  hernia repair 07/2005.  Marland Kitchen LBBB (left bundle branch block)   . Legally blind   . Morbid obesity ([place])   . Obstructive sleep apnea    On CPAP. Last sleep study (06/2009) - Severe obstructive sleep apnea/hypopnea syndrome, apnea-hypopnea index 70.5 per hour with non positional events moderately loud snoring and oxygen desaturation to a nadir of 85% on room air.  . Occult blood positive stool     History of in August 2007.  colonoscopy in January 2008 showing normal colon with fair inadequate prep. By Dr. Silvano Rusk.  . 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 Sepsis ([place]) 08/26/2013    Patient Active Problem List   Diagnosis Date Noted  . PEG (percutaneous endoscopic gastrostomy) status ([place])   . Endotracheal tube present   . Hypervolemia   . Acute respiratory failure ([place])   . Aspiration into airway   . Chronic respiratory failure with hypoxia and hypercapnia (HCC)   . Acute on chronic respiratory failure with hypoxia and hypercapnia (HCC)   . Goals of care, counseling/discussion   . Palliative care by specialist   . Acute diastolic CHF (congestive heart failure) ([place]) 04/28/2019  . Acute on chronic congestive heart failure ([place])   . Heart failure ([place]) 08/31/2018  . Paroxysmal atrial tachycardia ([place]) 05/12/2018  . Aortic valve stenosis, nonrheumatic 05/12/2018  . Diabetes mellitus type 2 in obese ([place]) 05/12/2018  . Chronic diastolic heart failure ([place]) 11/11/2017  . Hypernatremia 08/15/2017  .  AKI (acute kidney injury) ([place]) 08/15/2017  . Acute lower UTI 08/15/2017  . Coarse tremors 08/15/2017  . Amiodarone pulmonary toxicity   . Cellulitis   . Acute encephalopathy   . Acute pulmonary edema (HCC)   . Acute respiratory failure with hypoxia ([place]) 06/13/2017  . SSS (sick sinus syndrome) ([place]) 05/19/2016  . Cough 08/20/2015  . Morbid obesity ([place]) 07/09/2015  . Cardiac mass 10/04/2013  . Intraventricular hemorrhage ([place]) 10/02/2013  . CVA (cerebral infarction)  10/01/2013  . History of intracranial hemorrhage 10/01/2013  . ICH (intracerebral hemorrhage) ([place]) 10/01/2013  . Right knee pain 09/07/2013  . Stage 3b chronic kidney disease 09/07/2013  . Orchitis 09/05/2013  . Unspecified protein-calorie malnutrition ([place]) 09/05/2013  . Sepsis ([place]) 08/27/2013  . Trichomonas infection 08/27/2013  . Epididymo-orchitis, acute 08/27/2013  . PAF (paroxysmal atrial fibrillation) ([place]) 06/24/2013  . OSA (obstructive sleep apnea) 05/04/2011  . Shortness of breath 03/17/2011  . Depression 10/23/2010  . Degenerative joint disease   . Cardiomyopathy, hypertensive ([place])   . Coronary artery disease involving native coronary artery of native heart without angina pectoris   . Pacemaker   . Incarcerated ventral hernia   . COPD (chronic obstructive pulmonary disease) ([place]) 11/28/2009  . Diabetes mellitus due to underlying condition, controlled, with stage 3 chronic kidney disease, with long-term current use of insulin ([place]) 07/20/2006  . Hypercholesterolemia 07/20/2006  . GOUT 07/20/2006  . Macular degeneration (senile) of retina 07/20/2006  . Essential hypertension 07/20/2006  . BENIGN PROSTATIC HYPERTROPHY, HX OF 07/20/2006    Past Surgical History:  Procedure Laterality Date  . AMPUTATION Left 06/22/2013   Procedure: AMPUTATION FOOT;  Surgeon: Newt Minion, MD;  Location: [place];  Service: Orthopedics;  Laterality: Left;  Left Midfoot Amputation  . CARDIAC CATHETERIZATION  10/22/2003   Dilatation of the LV with preserved LV systolic function. Tortous aorta, but no AAA. Moderate disease with 50-60% area of narrowing in the circumflex after the second OM and 70% mid narrowing in the LAD. No intervention.  Marland Kitchen CARDIOVASCULAR STRESS TEST  01/23/2010   Perfusion defect seen in inferior myocardial region is consistent with diaphragmatic attenuation. Remaining myocardium demonstrates normal myocardial perfusion with no evidence of ischemia or infarct. Stress ECG was  non-diagnostic due to LBBB.  Marland Kitchen CATARACT EXTRACTION, BILATERAL Bilateral   . ESOPHAGOGASTRODUODENOSCOPY N/A 10/12/2013   Procedure: ESOPHAGOGASTRODUODENOSCOPY (EGD);  Surgeon: Gwenyth Ober, MD;  Location: [place];  Service: General;  Laterality: N/A;  bedside  . EYE SURGERY    . 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 HERNIA REPAIR    . INSERT / REPLACE / REMOVE PACEMAKER  2003/11/26   Secondary to symptomatic bradycardia. DDDR pacemaker. By Dr. Rollene Fare.  Randolm Idol / REPLACE / REMOVE PACEMAKER  11-25-2012   "old pacemaker died; had new one put in" (2013/07/13)  . IR GASTROSTOMY TUBE MOD SED  03/03/2020  . Pacemaker lead revision  12/2003    likely microdislodgment  of right ventricular lead  . PEG PLACEMENT N/A 10/12/2013   Procedure: PERCUTANEOUS ENDOSCOPIC GASTROSTOMY (PEG) PLACEMENT;  Surgeon: Gwenyth Ober, MD;  Location: [place];  Service: General;  Laterality: N/A;  . PERMANENT PACEMAKER GENERATOR CHANGE N/A 11/03/2012   Procedure: PERMANENT PACEMAKER GENERATOR CHANGE;  Surgeon: Sanda Klein, MD;  Location: [place] CATH LAB;  Service: Cardiovascular;  Laterality: N/A;  . REFRACTIVE SURGERY Bilateral   . RIGHT HEART CATH N/A 09/05/2018   Procedure: RIGHT HEART CATH;  Surgeon: Loralie Champagne  S, MD;  Location: [place] CV LAB;  Service: Cardiovascular;  Laterality: N/A;  . RIGHT/LEFT HEART CATH AND CORONARY ANGIOGRAPHY N/A 04/10/2019   Procedure: RIGHT/LEFT HEART CATH AND CORONARY ANGIOGRAPHY;  Surgeon: [name], MD;  Location: [place] CV LAB;  Service: Cardiovascular;  Laterality: N/A;  . TEE WITHOUT CARDIOVERSION N/A 09/05/2018   Procedure: TRANSESOPHAGEAL ECHOCARDIOGRAM (TEE);  Surgeon: Larey Dresser, MD;  Location: [facility] ENDOSCOPY;  Service: Cardiovascular;  Laterality: N/A;  . TEE WITHOUT CARDIOVERSION N/A 05/08/2019   Procedure: TRANSESOPHAGEAL ECHOCARDIOGRAM (TEE);  Surgeon: [name], MD;  Location: [place] CV LAB;  Service:  Open Heart Surgery;  Laterality: N/A;  . TRANSCATHETER AORTIC VALVE REPLACEMENT, TRANSFEMORAL N/A 05/08/2019   Procedure: TRANSCATHETER AORTIC VALVE REPLACEMENT, TRANSFEMORAL;  Surgeon: [name], MD;  Location: [place] CV LAB;  Service: Open Heart Surgery;  Laterality: N/A;  . TRANSTHORACIC ECHOCARDIOGRAM  01/01/2013   EF 50-55%, ventricular septum-thickness moderately increased  . VENOUS DOPPLER  01/17/2013   No evidence of thrombus or thrombophlebitis. Left GSVs appear enlarged and demonstrate valvular insuffiiciency. Bilateral SSVs appeared patent with no valvular insufficiency seen.  Marland Kitchen VENTRAL HERNIA REPAIR  07/2005    S/P laparotomy, lysis of adhesions, repair of incarcerated ventral hernia with  partial omentectomy.. Performed by Dr. Marlou Starks.       Family History  Problem Relation Age of Onset  . Coronary artery disease Sister   . Heart disease Sister   . Allergies Sister   . Asthma Brother   . Rheum arthritis Brother     Social History   Tobacco Use  . Smoking status: Former Smoker    Packs/day: 0.00    Years: 0.00    Pack years: 0.00    Types: Cigarettes    Quit date: 08/17/2003    Years since quitting: 16.6  . Smokeless tobacco: Never Used  . Tobacco comment: started at age 63.  only smoked on occ- very rarely.0  Vaping Use  . Vaping Use: Never used  Substance Use Topics  . Alcohol use: Yes    Comment: 06/23/2013 "drank some; never had a problem w/it"  . Drug use: No    Home Medications Prior to Admission medications   Medication Sig Start Date End Date Taking? Authorizing Provider  acetaminophen (TYLENOL) 650 MG CR tablet Take 1,300 mg by mouth 2 (two) times daily.     [provider]  amLODipine (NORVASC) 10 MG tablet Place 1 tablet (10 mg total) into feeding tube daily. 03/11/20   Briant Cedar, MD  aspirin EC 81 MG tablet Take 1 tablet (81 mg total) by mouth daily. 01/19/19   Clegg, Amy D, NP  carvedilol (COREG) 12.5 MG tablet Place 1 tablet  (12.5 mg total) into feeding tube 2 (two) times daily with a meal. 03/10/20   Pashayan, Redgie Grayer, MD  docusate (COLACE) 50 MG/5ML liquid Place 10 mLs (100 mg total) into feeding tube 2 (two) times daily. 03/10/20   Briant Cedar, MD  famotidine (PEPCID) 20 MG tablet Place 1 tablet (20 mg total) into feeding tube daily. 03/11/20   Briant Cedar, MD  Febuxostat (ULORIC) 80 MG TABS Take 80 mg by mouth in the morning.     [provider]  hydrALAZINE (APRESOLINE) 50 MG tablet Place 1 tablet (50 mg total) into feeding tube every 6 (six) hours. 03/10/20   Briant Cedar, MD  insulin aspart (NOVOLOG) 100 UNIT/ML injection Inject 6 Units into the skin every 4 (four) hours.  03/10/20   Briant Cedar, MD  insulin aspart (NOVOLOG) 100 UNIT/ML injection Inject 0-20 Units into the skin every 4 (four) hours. 03/10/20   Briant Cedar, MD  insulin detemir (LEVEMIR) 100 UNIT/ML injection Inject 0.4 mLs (40 Units total) into the skin 2 (two) times daily. 03/10/20   Briant Cedar, MD  Nutritional Supplements (FEEDING SUPPLEMENT, GLUCERNA 1.2 CAL,) LIQD Place 1,000 mLs into feeding tube continuous. 03/10/20   Briant Cedar, MD  Nutritional Supplements (FEEDING SUPPLEMENT, PROSOURCE TF,) liquid Place 45 mLs into feeding tube 3 (three) times daily. 03/10/20   Briant Cedar, MD  OXYGEN Inhale 2 L/min into the lungs continuous.    [provider]  polyethylene glycol (MIRALAX / GLYCOLAX) 17 g packet 17 g by Per NG tube route daily as needed for mild constipation or moderate constipation. 03/10/20   Briant Cedar, MD  polyethylene glycol (MIRALAX / GLYCOLAX) 17 g packet Place 17 g into feeding tube daily. 03/11/20   Briant Cedar, MD  torsemide (DEMADEX) 20 MG tablet Place 2 tablets (40 mg total) into feeding tube daily. 03/11/20   Briant Cedar, MD  Water For Irrigation, Sterile (FREE WATER) SOLN Place 400 mLs into feeding tube  every 4 (four) hours. 03/10/20   Briant Cedar, MD    Allergies    Patient has no known allergies.  Review of Systems   Review of Systems Level 5 caveat obvious patient is nonverbal.  Physical Exam Updated Vital Signs BP (!) 159/77 (BP Location: Right Arm)   Pulse 66   Temp 97.8 F (36.6 C) (Rectal)   Resp (!) 25   SpO2 98%   Physical Exam Vitals and nursing note reviewed.  Constitutional:      General: He is not in acute distress.    Appearance: He is well-developed. He is obese.  HENT:     Head: Normocephalic and atraumatic.  Eyes:     General:        Right eye: Discharge present.        Left eye: No discharge.     Comments: Conjunctivitis with purulent drainage right eye.  Cardiovascular:     Rate and Rhythm: Normal rate and regular rhythm.     Heart sounds: Normal heart sounds. No murmur heard.  No friction rub. No gallop.   Pulmonary:     Effort: Pulmonary effort is normal. No respiratory distress.     Breath sounds: Normal breath sounds.  Abdominal:     General: There is no distension.     Palpations: Abdomen is soft.     Tenderness: There is no abdominal tenderness.  Musculoskeletal:        General: No tenderness.     Cervical back: Neck supple.  Skin:    General: Skin is warm and dry.  Neurological:     Mental Status: He is alert.     Comments: Mood indication to voice.  Will open eyes to voice but not responding verbally.     ED Results / Procedures / Treatments   Labs (all labs ordered are listed, but only abnormal results are displayed) Labs Reviewed  CBC WITH DIFFERENTIAL/PLATELET - Abnormal; Notable for the following components:      Result Value   Hemoglobin 10.3 (*)    HCT 36.7 (*)    MCH 24.1 (*)    MCHC 28.1 (*)    RDW 18.1 (*)    Abs Immature Granulocytes 0.08 (*)  All other components within normal limits  BASIC METABOLIC PANEL - Abnormal; Notable for the following components:   Glucose, Bld 106 (*)    BUN 48 (*)     Creatinine, Ser 1.88 (*)    GFR calc non Af Amer 35 (*)    GFR calc Af Amer 41 (*)    All other components within normal limits  I-STAT ARTERIAL BLOOD GAS, ED - Abnormal; Notable for the following components:   pCO2 arterial 61.6 (*)    pO2, Arterial 73 (*)    Bicarbonate 35.9 (*)    TCO2 38 (*)    Acid-Base Excess 9.0 (*)    HCT 33.0 (*)    Hemoglobin 11.2 (*)    All other components within normal limits    EKG None  Radiology No results found.  Procedures Procedures (including critical care time)  Medications Ordered in ED Medications  erythromycin ophthalmic ointment 1 application (has no administration in time range)    ED Course  I have reviewed the triage vital signs and the nursing notes.  Pertinent labs & imaging results that were available during my care of the patient were reviewed by me and considered in my medical decision making (see chart for details).    MDM Rules/Calculators/A&P                          73yM with conjunctivitis R eye. Placed on erythromycin. Seems to be painful on exam but hard to get a good sense of symptoms otherwise with his mental status. He is blind at baseline. He is reportedly mostly aphasic at baseline and only intermittently follows commands. That is essentially what I am getting on his exam currently. CBC and BMP stable. ABG noted. Not to point that I think he requires hospitalization at this time. On CPAP QHS. Should improve with this. Extended [facility] wait for transfer back to facility. Hopefully won't get increasingly hypercarbic in mean time.    Final Clinical Impression(s) / ED Diagnoses Final diagnoses:  Acute conjunctivitis of right eye, unspecified acute conjunctivitis type    Rx / DC Orders ED Discharge Orders    None       Virgel Manifold, MD 03/24/20 0025

## 2020-03-21 NOTE — ED Notes (Signed)
Called and attempted to give report to [place]. No answer. Will let oncoming RN know.

## 2020-03-25 ENCOUNTER — Ambulatory Visit: Payer: Medicare (Managed Care) | Admitting: Hematology

## 2020-03-26 ENCOUNTER — Telehealth: Payer: Self-pay | Admitting: Hematology

## 2020-03-26 NOTE — Telephone Encounter (Signed)
R/s appt per 8/6 sch message - called [facility] and left message for transportation with new appt date and time

## 2020-03-27 ENCOUNTER — Emergency Department (HOSPITAL_COMMUNITY): Payer: Medicare (Managed Care)

## 2020-03-27 ENCOUNTER — Inpatient Hospital Stay (HOSPITAL_COMMUNITY)
Admission: EM | Admit: 2020-03-27 | Discharge: 2020-04-02 | DRG: 291 | Disposition: A | Discharge status: Skilled Nursing Facility | Payer: Medicare (Managed Care) | Source: Skilled Nursing Facility | Attending: Internal Medicine | Admitting: Internal Medicine

## 2020-03-27 DIAGNOSIS — I43 Cardiomyopathy in diseases classified elsewhere: Secondary | ICD-10-CM | POA: Diagnosis present

## 2020-03-27 DIAGNOSIS — D638 Anemia in other chronic diseases classified elsewhere: Secondary | ICD-10-CM | POA: Diagnosis present

## 2020-03-27 DIAGNOSIS — E785 Hyperlipidemia, unspecified: Secondary | ICD-10-CM | POA: Diagnosis present

## 2020-03-27 DIAGNOSIS — Z931 Gastrostomy status: Secondary | ICD-10-CM

## 2020-03-27 DIAGNOSIS — E038 Other specified hypothyroidism: Secondary | ICD-10-CM | POA: Diagnosis present

## 2020-03-27 DIAGNOSIS — Z950 Presence of cardiac pacemaker: Secondary | ICD-10-CM

## 2020-03-27 DIAGNOSIS — I130 Hypertensive heart and chronic kidney disease with heart failure and stage 1 through stage 4 chronic kidney disease, or unspecified chronic kidney disease: Secondary | ICD-10-CM | POA: Diagnosis not present

## 2020-03-27 DIAGNOSIS — Z794 Long term (current) use of insulin: Secondary | ICD-10-CM

## 2020-03-27 DIAGNOSIS — E669 Obesity, unspecified: Secondary | ICD-10-CM | POA: Diagnosis present

## 2020-03-27 DIAGNOSIS — I5033 Acute on chronic diastolic (congestive) heart failure: Secondary | ICD-10-CM | POA: Diagnosis present

## 2020-03-27 DIAGNOSIS — Z23 Encounter for immunization: Secondary | ICD-10-CM

## 2020-03-27 DIAGNOSIS — J9621 Acute and chronic respiratory failure with hypoxia: Secondary | ICD-10-CM | POA: Diagnosis present

## 2020-03-27 DIAGNOSIS — G9340 Encephalopathy, unspecified: Secondary | ICD-10-CM | POA: Diagnosis not present

## 2020-03-27 DIAGNOSIS — E662 Morbid (severe) obesity with alveolar hypoventilation: Secondary | ICD-10-CM | POA: Diagnosis present

## 2020-03-27 DIAGNOSIS — N1832 Chronic kidney disease, stage 3b: Secondary | ICD-10-CM | POA: Diagnosis present

## 2020-03-27 DIAGNOSIS — Z87891 Personal history of nicotine dependence: Secondary | ICD-10-CM

## 2020-03-27 DIAGNOSIS — Z20822 Contact with and (suspected) exposure to covid-19: Secondary | ICD-10-CM | POA: Diagnosis present

## 2020-03-27 DIAGNOSIS — J9601 Acute respiratory failure with hypoxia: Secondary | ICD-10-CM | POA: Diagnosis present

## 2020-03-27 DIAGNOSIS — E1169 Type 2 diabetes mellitus with other specified complication: Secondary | ICD-10-CM | POA: Diagnosis present

## 2020-03-27 DIAGNOSIS — G4733 Obstructive sleep apnea (adult) (pediatric): Secondary | ICD-10-CM | POA: Diagnosis present

## 2020-03-27 DIAGNOSIS — J449 Chronic obstructive pulmonary disease, unspecified: Secondary | ICD-10-CM | POA: Diagnosis present

## 2020-03-27 DIAGNOSIS — I480 Paroxysmal atrial fibrillation: Secondary | ICD-10-CM | POA: Diagnosis present

## 2020-03-27 DIAGNOSIS — Z6841 Body Mass Index (BMI) 40.0 and over, adult: Secondary | ICD-10-CM

## 2020-03-27 DIAGNOSIS — K219 Gastro-esophageal reflux disease without esophagitis: Secondary | ICD-10-CM | POA: Diagnosis present

## 2020-03-27 DIAGNOSIS — Z7401 Bed confinement status: Secondary | ICD-10-CM

## 2020-03-27 DIAGNOSIS — Z7982 Long term (current) use of aspirin: Secondary | ICD-10-CM

## 2020-03-27 DIAGNOSIS — I2510 Atherosclerotic heart disease of native coronary artery without angina pectoris: Secondary | ICD-10-CM | POA: Diagnosis present

## 2020-03-27 DIAGNOSIS — M109 Gout, unspecified: Secondary | ICD-10-CM | POA: Diagnosis present

## 2020-03-27 DIAGNOSIS — R0602 Shortness of breath: Secondary | ICD-10-CM | POA: Diagnosis not present

## 2020-03-27 DIAGNOSIS — J9622 Acute and chronic respiratory failure with hypercapnia: Secondary | ICD-10-CM | POA: Diagnosis present

## 2020-03-27 DIAGNOSIS — I5031 Acute diastolic (congestive) heart failure: Secondary | ICD-10-CM | POA: Diagnosis present

## 2020-03-27 DIAGNOSIS — Z9981 Dependence on supplemental oxygen: Secondary | ICD-10-CM

## 2020-03-27 DIAGNOSIS — E1142 Type 2 diabetes mellitus with diabetic polyneuropathy: Secondary | ICD-10-CM | POA: Diagnosis present

## 2020-03-27 DIAGNOSIS — Z79899 Other long term (current) drug therapy: Secondary | ICD-10-CM

## 2020-03-27 DIAGNOSIS — Z8673 Personal history of transient ischemic attack (TIA), and cerebral infarction without residual deficits: Secondary | ICD-10-CM

## 2020-03-27 DIAGNOSIS — H548 Legal blindness, as defined in USA: Secondary | ICD-10-CM | POA: Diagnosis present

## 2020-03-27 DIAGNOSIS — I509 Heart failure, unspecified: Secondary | ICD-10-CM

## 2020-03-27 DIAGNOSIS — E1122 Type 2 diabetes mellitus with diabetic chronic kidney disease: Secondary | ICD-10-CM | POA: Diagnosis present

## 2020-03-27 DIAGNOSIS — Z781 Physical restraint status: Secondary | ICD-10-CM

## 2020-03-27 DIAGNOSIS — N400 Enlarged prostate without lower urinary tract symptoms: Secondary | ICD-10-CM | POA: Diagnosis present

## 2020-03-27 LAB — I-STAT VENOUS BLOOD GAS, ED
Acid-Base Excess: 11 mmol/L — ABNORMAL HIGH (ref 0.0–2.0)
Acid-Base Excess: 13 mmol/L — ABNORMAL HIGH (ref 0.0–2.0)
Bicarbonate: 38.7 mmol/L — ABNORMAL HIGH (ref 20.0–28.0)
Bicarbonate: 39.8 mmol/L — ABNORMAL HIGH (ref 20.0–28.0)
Calcium, Ion: 1.24 mmol/L (ref 1.15–1.40)
Calcium, Ion: 1.22 mmol/L (ref 1.15–1.40)
HCT: 37 % — ABNORMAL LOW (ref 39.0–52.0)
HCT: 34 % — ABNORMAL LOW (ref 39.0–52.0)
Hemoglobin: 12.6 g/dL — ABNORMAL LOW (ref 13.0–17.0)
Hemoglobin: 11.6 g/dL — ABNORMAL LOW (ref 13.0–17.0)
O2 Saturation: 83 %
O2 Saturation: 67 %
Potassium: 4.2 mmol/L (ref 3.5–5.1)
Potassium: 4.3 mmol/L (ref 3.5–5.1)
Sodium: 144 mmol/L (ref 135–145)
Sodium: 143 mmol/L (ref 135–145)
TCO2: 41 mmol/L — ABNORMAL HIGH (ref 22–32)
TCO2: 42 mmol/L — ABNORMAL HIGH (ref 22–32)
pCO2, Ven: 69.3 mmHg — ABNORMAL HIGH (ref 44.0–60.0)
pCO2, Ven: 62.9 mmHg — ABNORMAL HIGH (ref 44.0–60.0)
pH, Ven: 7.355 (ref 7.250–7.430)
pH, Ven: 7.409 (ref 7.250–7.430)
pO2, Ven: 52 mmHg — ABNORMAL HIGH (ref 32.0–45.0)
pO2, Ven: 36 mmHg (ref 32.0–45.0)

## 2020-03-27 LAB — COMPREHENSIVE METABOLIC PANEL
ALT: 9 U/L (ref 0–44)
AST: 13 U/L — ABNORMAL LOW (ref 15–41)
Albumin: 2.9 g/dL — ABNORMAL LOW (ref 3.5–5.0)
Alkaline Phosphatase: 65 U/L (ref 38–126)
Anion gap: 9 (ref 5–15)
BUN: 46 mg/dL — ABNORMAL HIGH (ref 8–23)
CO2: 33 mmol/L — ABNORMAL HIGH (ref 22–32)
Calcium: 10 mg/dL (ref 8.9–10.3)
Chloride: 100 mmol/L (ref 98–111)
Creatinine, Ser: 1.72 mg/dL — ABNORMAL HIGH (ref 0.61–1.24)
GFR calc Af Amer: 45 mL/min — ABNORMAL LOW (ref >60)
GFR calc non Af Amer: 39 mL/min — ABNORMAL LOW (ref >60)
Glucose, Bld: 142 mg/dL — ABNORMAL HIGH (ref 70–99)
Potassium: 4.2 mmol/L (ref 3.5–5.1)
Sodium: 142 mmol/L (ref 135–145)
Total Bilirubin: 0.6 mg/dL (ref 0.3–1.2)
Total Protein: 7.9 g/dL (ref 6.5–8.1)

## 2020-03-27 LAB — TROPONIN I (HIGH SENSITIVITY)
Troponin I (High Sensitivity): 21 ng/L — ABNORMAL HIGH (ref <18)
Troponin I (High Sensitivity): 20 ng/L — ABNORMAL HIGH (ref <18)

## 2020-03-27 LAB — CBC WITH DIFFERENTIAL/PLATELET
Abs Immature Granulocytes: 0.06 10*3/uL (ref 0.00–0.07)
Basophils Absolute: 0 10*3/uL (ref 0.0–0.1)
Basophils Relative: 0 %
Eosinophils Absolute: 0.1 10*3/uL (ref 0.0–0.5)
Eosinophils Relative: 1 %
HCT: 39.4 % (ref 39.0–52.0)
Hemoglobin: 10.8 g/dL — ABNORMAL LOW (ref 13.0–17.0)
Immature Granulocytes: 1 %
Lymphocytes Relative: 10 %
Lymphs Abs: 1.1 10*3/uL (ref 0.7–4.0)
MCH: 24.1 pg — ABNORMAL LOW (ref 26.0–34.0)
MCHC: 27.4 g/dL — ABNORMAL LOW (ref 30.0–36.0)
MCV: 87.8 fL (ref 80.0–100.0)
Monocytes Absolute: 0.9 10*3/uL (ref 0.1–1.0)
Monocytes Relative: 8 %
Neutro Abs: 8.5 10*3/uL — ABNORMAL HIGH (ref 1.7–7.7)
Neutrophils Relative %: 80 %
Platelets: 143 10*3/uL — ABNORMAL LOW (ref 150–400)
RBC: 4.49 MIL/uL (ref 4.22–5.81)
RDW: 17.6 % — ABNORMAL HIGH (ref 11.5–15.5)
WBC: 10.7 10*3/uL — ABNORMAL HIGH (ref 4.0–10.5)
nRBC: 0 % (ref 0.0–0.2)

## 2020-03-27 LAB — BRAIN NATRIURETIC PEPTIDE: B Natriuretic Peptide: 203.6 pg/mL — ABNORMAL HIGH (ref 0.0–100.0)

## 2020-03-27 LAB — LACTIC ACID, PLASMA
Lactic Acid, Venous: 0.7 mmol/L (ref 0.5–1.9)
Lactic Acid, Venous: 0.9 mmol/L (ref 0.5–1.9)

## 2020-03-27 LAB — PROTIME-INR
INR: 1.1 (ref 0.8–1.2)
Prothrombin Time: 13.9 seconds (ref 11.4–15.2)

## 2020-03-27 LAB — SARS CORONAVIRUS 2 BY RT PCR (HOSPITAL ORDER, PERFORMED IN ~~LOC~~ HOSPITAL LAB): SARS Coronavirus 2: NEGATIVE

## 2020-03-27 LAB — APTT: aPTT: 32 seconds (ref 24–36)

## 2020-03-27 MED ORDER — FUROSEMIDE 10 MG/ML IJ SOLN
60.0000 mg | Freq: Once | INTRAMUSCULAR | Status: AC
  Administered 2020-03-27: 60 mg via INTRAVENOUS
  Filled 2020-03-27: qty 6

## 2020-03-27 NOTE — ED Notes (Signed)
[name] daughter [phone] would like an update on the pt

## 2020-03-27 NOTE — ED Provider Notes (Signed)
[facility] EMERGENCY DEPARTMENT Provider Note   CSN: [id number] Arrival date & time: 03/27/20  1826     History CC: Respiratory distress  [name] is a 74 y.o. male with a history of blindness, intermittent cephalopathy, intraventricular hemorrhage, PEG tube placement in July 2019, hospitalization in July 2021 for acute hypercapnic and hypoxemic respiratory failure and aspiration pneumonia, presenting to emergency department from his facility with concern for increased work of breathing and shortness of breath.  Patient was brought in by EMS from [place] living in rehab with reports of respiratory distress.  Reportedly was tachypneic and satting 90% on room air, 95% on 3L NA.  He has chronic right-sided weakness.  EMS reported he was afebrile.  On arrival the patient is somnolent and cannot provide additional history to me.  He does arouse to stimulation  Review of her medical records reveals on his recent hospitalization in July he was been admitted for respiratory failure thought to be secondary to aspiration pneumonia.  He was noted to have some waxing and waning cephalopathy at that time, which fluctuated from day-to-day with him being able to follow commands and answer one-word questions, but but mostly being aphasic  HPI     Past Medical History:  Diagnosis Date  . Arthritis    "all over" (06/23/2013)  . Atrial fibrillation ([place])    not Candidate for Coumadin d/t rectal bleeding  . Benign prostatic hypertrophy   . Cardiomyopathy, hypertensive ([place])     diastolic dysfunction by 3810 echo  . Coronary artery disease     nonobstructive. 2 vessel. cardiac catheter in March 2005 showing 60% LAD occlusion, 30% circumflex occlusion.  . Degenerative joint disease     Right knee.  . Diabetes type 2, uncontrolled ([place])   . Diabetic peripheral neuropathy ([place])   . Diabetic ulcer of thigh ([place])    H/O Rt thigh ulcer.  . Exertional shortness of breath     "comes and goes" (06/23/2013)  . GERD (gastroesophageal reflux disease)   . Gout   . Herpes   . Hyperlipidemia   . Hypertension   . Hypotension 09/05/2013  . Incarcerated ventral hernia    H/O. S/P repair ventral hernia repair 07/2005.  Marland Kitchen LBBB (left bundle branch block)   . Legally blind   . Morbid obesity ([place])   . Obstructive sleep apnea    On CPAP. Last sleep study (06/2009) - Severe obstructive sleep apnea/hypopnea syndrome, apnea-hypopnea index 70.5 per hour with non positional events moderately loud snoring and oxygen desaturation to a nadir of 85% on room air.  . Occult blood positive stool     History of in August 2007.  colonoscopy in January 2008 showing normal colon with fair inadequate prep. By Dr. Silvano Rusk.  . 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 Sepsis ([place]) 08/26/2013    Patient Active Problem List   Diagnosis Date Noted  . PEG (percutaneous endoscopic gastrostomy) status ([place])   . Endotracheal tube present   . Hypervolemia   . Acute respiratory failure ([place])   . Aspiration into airway   . Chronic respiratory failure with hypoxia and hypercapnia (HCC)   . Acute on chronic respiratory failure with hypoxia and hypercapnia (HCC)   . Goals of care, counseling/discussion   . Palliative care by specialist   . Acute diastolic CHF (congestive heart failure) ([place]) 04/28/2019  . Acute on chronic congestive heart failure ([place])   . Heart failure ([place]) 08/31/2018  .  Paroxysmal atrial tachycardia ([place]) 05/12/2018  . Aortic valve stenosis, nonrheumatic 05/12/2018  . Diabetes mellitus type 2 in obese ([place]) 05/12/2018  . Chronic diastolic heart failure ([place]) 11/11/2017  . Hypernatremia 08/15/2017  . AKI (acute kidney injury) ([place]) 08/15/2017  . Acute lower UTI 08/15/2017  . Coarse tremors 08/15/2017  . Amiodarone pulmonary toxicity   . Cellulitis   . Acute encephalopathy   . Acute pulmonary edema (HCC)   . Acute respiratory failure with hypoxia ([place])  06/13/2017  . SSS (sick sinus syndrome) ([place]) 05/19/2016  . Cough 08/20/2015  . Morbid obesity ([place]) 07/09/2015  . Cardiac mass 10/04/2013  . Intraventricular hemorrhage ([place]) 10/02/2013  . CVA (cerebral infarction) 10/01/2013  . History of intracranial hemorrhage 10/01/2013  . ICH (intracerebral hemorrhage) ([place]) 10/01/2013  . Right knee pain 09/07/2013  . Stage 3b chronic kidney disease 09/07/2013  . Orchitis 09/05/2013  . Unspecified protein-calorie malnutrition ([place]) 09/05/2013  . Sepsis ([place]) 08/27/2013  . Trichomonas infection 08/27/2013  . Epididymo-orchitis, acute 08/27/2013  . PAF (paroxysmal atrial fibrillation) ([place]) 06/24/2013  . OSA (obstructive sleep apnea) 05/04/2011  . Shortness of breath 03/17/2011  . Depression 10/23/2010  . Degenerative joint disease   . Cardiomyopathy, hypertensive ([place])   . Coronary artery disease involving native coronary artery of native heart without angina pectoris   . Pacemaker   . Incarcerated ventral hernia   . COPD (chronic obstructive pulmonary disease) ([place]) 11/28/2009  . Diabetes mellitus due to underlying condition, controlled, with stage 3 chronic kidney disease, with long-term current use of insulin ([place]) 07/20/2006  . Hypercholesterolemia 07/20/2006  . GOUT 07/20/2006  . Macular degeneration (senile) of retina 07/20/2006  . Essential hypertension 07/20/2006  . BENIGN PROSTATIC HYPERTROPHY, HX OF 07/20/2006    Past Surgical History:  Procedure Laterality Date  . AMPUTATION Left 06/22/2013   Procedure: AMPUTATION FOOT;  Surgeon: Newt Minion, MD;  Location: [facility];  Service: Orthopedics;  Laterality: Left;  Left Midfoot Amputation  . CARDIAC CATHETERIZATION  10/22/2003   Dilatation of the LV with preserved LV systolic function. Tortous aorta, but no AAA. Moderate disease with 50-60% area of narrowing in the circumflex after the second OM and 70% mid narrowing in the LAD. No intervention.  Marland Kitchen CARDIOVASCULAR STRESS TEST  01/23/2010     Perfusion defect seen in inferior myocardial region is consistent with diaphragmatic attenuation. Remaining myocardium demonstrates normal myocardial perfusion with no evidence of ischemia or infarct. Stress ECG was non-diagnostic due to LBBB.  Marland Kitchen CATARACT EXTRACTION, BILATERAL Bilateral   . ESOPHAGOGASTRODUODENOSCOPY N/A 10/12/2013   Procedure: ESOPHAGOGASTRODUODENOSCOPY (EGD);  Surgeon: Gwenyth Ober, MD;  Location: [facility];  Service: General;  Laterality: N/A;  bedside  . EYE SURGERY    . 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 HERNIA REPAIR    . INSERT / REPLACE / REMOVE PACEMAKER  12/03/03   Secondary to symptomatic bradycardia. DDDR pacemaker. By Dr. Rollene Fare.  Randolm Idol / REPLACE / REMOVE PACEMAKER  12/02/2012   "old pacemaker died; had new one put in" (2013-07-20)  . IR GASTROSTOMY TUBE MOD SED  03/03/2020  . Pacemaker lead revision  12/2003    likely microdislodgment  of right ventricular lead  . PEG PLACEMENT N/A 10/12/2013   Procedure: PERCUTANEOUS ENDOSCOPIC GASTROSTOMY (PEG) PLACEMENT;  Surgeon: Gwenyth Ober, MD;  Location: [facility];  Service: General;  Laterality: N/A;  . PERMANENT PACEMAKER GENERATOR CHANGE N/A 11/03/2012   Procedure: PERMANENT PACEMAKER GENERATOR CHANGE;  Surgeon:  Sanda Klein, MD;  Location: [facility] CATH LAB;  Service: Cardiovascular;  Laterality: N/A;  . REFRACTIVE SURGERY Bilateral   . RIGHT HEART CATH N/A 09/05/2018   Procedure: RIGHT HEART CATH;  Surgeon: Larey Dresser, MD;  Location: [facility] CV LAB;  Service: Cardiovascular;  Laterality: N/A;  . RIGHT/LEFT HEART CATH AND CORONARY ANGIOGRAPHY N/A 04/10/2019   Procedure: RIGHT/LEFT HEART CATH AND CORONARY ANGIOGRAPHY;  Surgeon: [name], MD;  Location: [facility] CV LAB;  Service: Cardiovascular;  Laterality: N/A;  . TEE WITHOUT CARDIOVERSION N/A 09/05/2018   Procedure: TRANSESOPHAGEAL ECHOCARDIOGRAM (TEE);  Surgeon: Larey Dresser, MD;  Location[facility]  ENDOSCOPY;  Service: Cardiovascular;  Laterality: N/A;  . TEE WITHOUT CARDIOVERSION N/A 05/08/2019   Procedure: TRANSESOPHAGEAL ECHOCARDIOGRAM (TEE);  Surgeon: [name], MD;  Location: [facility] CV LAB;  Service: Open Heart Surgery;  Laterality: N/A;  . TRANSCATHETER AORTIC VALVE REPLACEMENT, TRANSFEMORAL N/A 05/08/2019   Procedure: TRANSCATHETER AORTIC VALVE REPLACEMENT, TRANSFEMORAL;  Surgeon: [name], MD;  Location: [facility] CV LAB;  Service: Open Heart Surgery;  Laterality: N/A;  . TRANSTHORACIC ECHOCARDIOGRAM  01/01/2013   EF 50-55%, ventricular septum-thickness moderately increased  . VENOUS DOPPLER  01/17/2013   No evidence of thrombus or thrombophlebitis. Left GSVs appear enlarged and demonstrate valvular insuffiiciency. Bilateral SSVs appeared patent with no valvular insufficiency seen.  Marland Kitchen VENTRAL HERNIA REPAIR  07/2005    S/P laparotomy, lysis of adhesions, repair of incarcerated ventral hernia with  partial omentectomy.. Performed by Dr. Marlou Starks.       Family History  Problem Relation Age of Onset  . Coronary artery disease Sister   . Heart disease Sister   . Allergies Sister   . Asthma Brother   . Rheum arthritis Brother     Social History   Tobacco Use  . Smoking status: Former Smoker    Packs/day: 0.00    Years: 0.00    Pack years: 0.00    Types: Cigarettes    Quit date: 08/17/2003    Years since quitting: 16.6  . Smokeless tobacco: Never Used  . Tobacco comment: started at age 42.  only smoked on occ- very rarely.0  Vaping Use  . Vaping Use: Never used  Substance Use Topics  . Alcohol use: Yes    Comment: 06/23/2013 "drank some; never had a problem w/it"  . Drug use: No    Home Medications Prior to Admission medications   Medication Sig Start Date End Date Taking? Authorizing Provider  acetaminophen (TYLENOL) 650 MG CR tablet 1,300 mg See admin instructions. 1,300 mg per tube two times a day   Yes [provider]  Amino Acids-Protein  Hydrolys (PRO-STAT) LIQD Place 30 mLs into feeding tube See admin instructions. Mix 30 ml's of Pro-Stat with 120 ml's of water and give, per PEG-Tube, 2 times a day 03/10/20  Yes [provider]  amLODipine (NORVASC) 10 MG tablet Place 1 tablet (10 mg total) into feeding tube daily. 03/11/20  Yes Pashayan, Redgie Grayer, MD  aspirin 81 MG chewable tablet Place 81 mg into feeding tube daily.   Yes [provider]  carvedilol (COREG) 12.5 MG tablet Place 1 tablet (12.5 mg total) into feeding tube 2 (two) times daily with a meal. 03/10/20  Yes Pashayan, Redgie Grayer, MD  docusate (COLACE) 50 MG/5ML liquid Place 10 mLs (100 mg total) into feeding tube 2 (two) times daily. 03/10/20  Yes Pashayan, Redgie Grayer, MD  famotidine (PEPCID) 20 MG tablet Place 1 tablet (20 mg  total) into feeding tube daily. 03/11/20  Yes Pashayan, Redgie Grayer, MD  Febuxostat (ULORIC) 80 MG TABS Place 80 mg into feeding tube at bedtime.    Yes [provider]  hydrALAZINE (APRESOLINE) 50 MG tablet Place 1 tablet (50 mg total) into feeding tube every 6 (six) hours. Patient taking differently: Place 50 mg into feeding tube 4 (four) times daily.  03/10/20  Yes Pashayan, Redgie Grayer, MD  insulin aspart (NOVOLOG FLEXPEN) 100 UNIT/ML FlexPen Inject 0-12 Units into the skin See admin instructions. Inject 6 units into the skin every six hours and, per sliding scale twice a day, if BGL <200 = give nothing; 201-300 = 4 extra units; 301-400 = 8 extra units; >400 = 12 units and call MD   Yes [provider]  insulin detemir (LEVEMIR) 100 UNIT/ML injection Inject 0.4 mLs (40 Units total) into the skin 2 (two) times daily. Patient taking differently: Inject 35 Units into the skin 2 (two) times daily.  03/10/20  Yes Pashayan, Redgie Grayer, MD  ipratropium-albuterol (DUONEB) 0.5-2.5 (3) MG/3ML SOLN Take 3 mLs by nebulization every 6 (six) hours as needed (for wheezing or shortness of breath). 03/27/20  Yes [provider]  Nutritional Supplements (FEEDING SUPPLEMENT, GLUCERNA 1.2 CAL,) LIQD Place 1,000 mLs into feeding tube continuous. Patient taking differently: Place 65 mL/hr into feeding tube continuous.  03/10/20  Yes Pashayan, Redgie Grayer, MD  OXYGEN Inhale 2-4 L/min into the lungs See admin instructions. 2 L/min continuously and increase to 4 L/min as needed for shortness of breath   Yes [provider]  polyethylene glycol (MIRALAX / GLYCOLAX) 17 g packet 17 g by Per NG tube route daily as needed for mild constipation or moderate constipation. Patient taking differently: Place 17 g into feeding tube daily. MIX INTO 8 OUNCES OF WATER 03/10/20  Yes Pashayan, Redgie Grayer, MD  torsemide (DEMADEX) 20 MG tablet Place 2 tablets (40 mg total) into feeding tube daily. Patient taking differently: Place 40 mg into feeding tube 2 (two) times daily.  03/11/20  Yes Pashayan, Redgie Grayer, MD  Water For Irrigation, Sterile (FREE WATER) SOLN Place 400 mLs into feeding tube every 4 (four) hours. Patient taking differently: Place 200 mLs into feeding tube every 4 (four) hours.  03/10/20  Yes Briant Cedar, MD  aspirin EC 81 MG tablet Take 1 tablet (81 mg total) by mouth daily. Patient not taking: Reported on 03/20/2020 01/19/19   Darrick Grinder D, NP  erythromycin ophthalmic ointment Place a 1/2 inch ribbon of ointment into the lower eyelid every 4 hours for 7 days. Patient not taking: Reported on 03/27/2020 03/20/20   Virgel Manifold, MD  insulin aspart (NOVOLOG) 100 UNIT/ML injection Inject 6 Units into the skin every 4 (four) hours. Patient not taking: Reported on 03/27/2020 03/10/20   Briant Cedar, MD  insulin aspart (NOVOLOG) 100 UNIT/ML injection Inject 0-20 Units into the skin every 4 (four) hours. Patient not taking: Reported on 03/27/2020 03/10/20   Briant Cedar, MD  Nutritional Supplements (FEEDING SUPPLEMENT, PROSOURCE TF,) liquid Place 45 mLs into feeding tube 3 (three) times daily. Patient not  taking: Reported on 03/27/2020 03/10/20   Briant Cedar, MD  polyethylene glycol (MIRALAX / GLYCOLAX) 17 g packet Place 17 g into feeding tube daily. Patient not taking: Reported on 03/27/2020 03/11/20   Briant Cedar, MD    Allergies    Patient has no known allergies.  Review of Systems   Review of Systems  Unable to perform ROS: Patient nonverbal (level 5 caveat)    Physical Exam Updated Vital Signs BP (!) 150/71   Pulse 64   Temp 97.8 F (36.6 C) (Oral)   Resp 13   SpO2 96%   Physical Exam Vitals and nursing note reviewed.  Constitutional:      Appearance: He is well-developed. He is obese.     Comments: Somnolent, arouses to touch  HENT:     Head: Normocephalic and atraumatic.  Eyes:     Conjunctiva/sclera: Conjunctivae normal.     Pupils: Pupils are equal, round, and reactive to light.  Cardiovascular:     Rate and Rhythm: Normal rate and regular rhythm.     Pulses: Normal pulses.  Pulmonary:     Effort: Pulmonary effort is normal. No respiratory distress.     Comments: 95% on 2L [place] Crackles in lung bases Abdominal:     General: There is no distension.     Palpations: Abdomen is soft.     Tenderness: There is no abdominal tenderness.  Musculoskeletal:     Cervical back: Neck supple.  Skin:    General: Skin is warm and dry.     ED Results / Procedures / Treatments   Labs (all labs ordered are listed, but only abnormal results are displayed) Labs Reviewed  COMPREHENSIVE METABOLIC PANEL - Abnormal; Notable for the following components:      Result Value   CO2 33 (*)    Glucose, Bld 142 (*)    BUN 46 (*)    Creatinine, Ser 1.72 (*)    Albumin 2.9 (*)    AST 13 (*)    GFR calc non Af Amer 39 (*)    GFR calc Af Amer 45 (*)    All other components within normal limits  CBC WITH DIFFERENTIAL/PLATELET - Abnormal; Notable for the following components:   WBC 10.7 (*)    Hemoglobin 10.8 (*)    MCH 24.1 (*)    MCHC 27.4 (*)    RDW 17.6 (*)     Platelets 143 (*)    Neutro Abs 8.5 (*)    All other components within normal limits  BRAIN NATRIURETIC PEPTIDE - Abnormal; Notable for the following components:   B Natriuretic Peptide 203.6 (*)    All other components within normal limits  I-STAT VENOUS BLOOD GAS, ED - Abnormal; Notable for the following components:   pCO2, Ven 69.3 (*)    pO2, Ven 52.0 (*)    Bicarbonate 38.7 (*)    TCO2 41 (*)    Acid-Base Excess 11.0 (*)    HCT 37.0 (*)    Hemoglobin 12.6 (*)    All other components within normal limits  I-STAT VENOUS BLOOD GAS, ED - Abnormal; Notable for the following components:   pCO2, Ven 62.9 (*)    Bicarbonate 39.8 (*)    TCO2 42 (*)    Acid-Base Excess 13.0 (*)    HCT 34.0 (*)    Hemoglobin 11.6 (*)    All other components within normal limits  TROPONIN I (HIGH SENSITIVITY) - Abnormal; Notable for the following components:   Troponin I (High Sensitivity) 21 (*)    All other components within normal limits  TROPONIN I (HIGH SENSITIVITY) - Abnormal; Notable for the following components:   Troponin I (High Sensitivity) 20 (*)    All other components within normal limits  SARS CORONAVIRUS 2 BY RT PCR (HOSPITAL ORDER, [facility])  CULTURE,  BLOOD (SINGLE)  URINE CULTURE  LACTIC ACID, PLASMA  LACTIC ACID, PLASMA  PROTIME-INR  APTT  URINALYSIS, ROUTINE W REFLEX MICROSCOPIC    EKG EKG Interpretation  Date/Time:  Thursday March 27 2020 18:57:20 EDT Ventricular Rate:  62 PR Interval:    QRS Duration: 121 QT Interval:  427 QTC Calculation: 441 R Axis:   -20 Text Interpretation: A-V dual-paced complexes w/ some inhibition No further analysis attempted due to paced rhythm Does not meet sgarbossa criteria, no STEMI Confirmed by Octaviano Glow [phone]) on 03/27/2020 7:10:59 PM   Radiology CT Head Wo Contrast  Result Date: 03/27/2020 CLINICAL DATA:  Mental status changes EXAM: CT HEAD WITHOUT CONTRAST TECHNIQUE: Contiguous axial images were  obtained from the base of the skull through the vertex without intravenous contrast. COMPARISON:  02/15/2020 FINDINGS: Brain: Diffuse cerebral atrophy and extensive chronic small vessel disease throughout the deep white matter. No acute intracranial abnormality. Specifically, no hemorrhage, hydrocephalus, mass lesion, acute infarction, or significant intracranial injury. Vascular: No hyperdense vessel or unexpected calcification. Skull: No acute calvarial abnormality. Sinuses/Orbits: Visualized paranasal sinuses and mastoids clear. Orbital soft tissues unremarkable. Other: None IMPRESSION: Atrophy, chronic microvascular disease. No acute intracranial abnormality. Electronically Signed   By: Rolm Baptise M.D.   On: 03/27/2020 19:58   CT Chest Wo Contrast  Result Date: 03/27/2020 CLINICAL DATA:  Cough and history of aspiration pneumonia, assess for aspiration EXAM: CT CHEST WITHOUT CONTRAST TECHNIQUE: Multidetector CT imaging of the chest was performed following the standard protocol without IV contrast. COMPARISON:  CT 02/14/2020 FINDINGS: Cardiovascular: There is cardiomegaly with predominantly right heart enlargement. Coronary artery calcifications are present. Transcatheter aortic valve replacement in stable positioning. A right chest wall battery pack is present with leads at the right atrium and cardiac apex. No pericardial effusion. Atherosclerotic plaque within the normal caliber aorta. Central pulmonary arteries are borderline enlarged. Luminal evaluation of the vasculature precluded in the absence of contrast media. Mediastinum/Nodes: No mediastinal fluid or gas. Normal thyroid gland and thoracic inlet. No acute abnormality of the trachea or esophagus. No worrisome mediastinal or axillary adenopathy. Few prominent mediastinal nodes are similar to comparison is. Hilar nodal evaluation is limited in the absence of intravenous contrast media. Lungs/Pleura: Evaluation of the lung parenchyma limited by  extensive respiratory motion artifact. There is asymmetric, right greater than left dependent atelectatic changes posteriorly. No residual effusions are present. There is marked central pulmonary vascular congestion and redistribution cephalized vessels as well as some mild fissural thickening. Minimal if any significant interlobular septal thickening is present. No pneumothorax. No focal consolidative process is seen. Upper Abdomen: Percutaneous gastrostomy tube in the left upper quadrant appears appropriately positioned with the balloon in the gastric lumen towards the antrum. No acute abnormalities present in the visualized portions of the upper abdomen. Musculoskeletal: Mild body wall edema. Bilateral gynecomastia, left greater than right. No worrisome chest wall lesions. Multilevel degenerative changes are present in the imaged portions of the spine. No acute osseous abnormality or suspicious osseous lesion. IMPRESSION: 1. Limited evaluation of the lung parenchyma limited by extensive respiratory motion artifact. 2. Some atelectatic changes are present in the lung bases, right greater than left 3. No focal consolidative opacity to suggest an aspiration pneumonia at this time. 4. Marked central pulmonary vascular congestion and redistribution cephalized vessels as well as some mild fissural thickening. Minimal if any significant interlobular septal thickening. Could reflect some mild interstitial edema/features of CHF. 5. Cardiomegaly with predominantly right heart enlargement. Coronary artery calcifications are present. Please  note that the presence of coronary artery calcium documents the presence of coronary artery disease, the severity of this disease and any potential stenosis cannot be assessed on this non-gated CT examination. 6. Aortic Atherosclerosis (ICD10-I70.0). Electronically Signed   By: Lovena Le M.D.   On: 03/27/2020 20:15   DG Chest Port 1 View  Result Date: 03/27/2020 CLINICAL DATA:   Possible sepsis EXAM: PORTABLE CHEST 1 VIEW COMPARISON:  Radiograph 02/25/2020 FINDINGS: Patient is rotated in a left anterior obliquity superimposing much of the mediastinum over the right chest wall. The heart is enlarged though similar to comparisons. Pacer pack overlies the right chest wall with leads at the cardiac apex and right atrium. Aortic valve replacement is noted as well. There are hazy mid to lower lung predominant opacities as well as more consolidative opacity in the right lung base on a background of pulmonary vascular congestion with right fissural thickening as well. Suspect at least a small right effusion. No visible left effusion. No pneumothorax. No acute osseous or soft tissue abnormality. Telemetry leads overlie the chest. IMPRESSION: Findings are concerning for CHF/volume overload with edema though a superimposed infectious process could be present particularly in the regions of more focal consolidation in the right lower lung. Electronically Signed   By: Lovena Le M.D.   On: 03/27/2020 19:13    Procedures Procedures (including critical care time)  Medications Ordered in ED Medications  furosemide (LASIX) injection 60 mg (60 mg Intravenous Given 03/27/20 2036)    ED Course  I have reviewed the triage vital signs and the nursing notes.  Pertinent labs & imaging results that were available during my care of the patient were reviewed by me and considered in my medical decision making (see chart for details).  74 year old male presented emergency department with reported increased work of breathing at his nursing care facility today.  He is requiring only 2 L nasal cannula maintaining his oxygen saturations here.  He is somnolent on arrival.  Stable determine what his baseline is.  Per his last hospital discharge, it appears that he does have intermittent encephalopathy and waxing and waning mental status, and has limited verbal skills at times.  His daughter by phone tells me  at times he can speak in full sentences but does have waxing and waning mental status.  However typically he can respond yes/no to questions.  Plan to initiate a broad work-up for possible aspiration pneumonia versus acute coronary syndrome versus hypercapnic respiratory failure versus worsening intracranial bleed versus other  Labs, venous gas, chest x-ray ordered.  EKG and troponins as well.  CT scan of the head and chest ordered.  *  Update labs reviewed as below I personally reviewed his prior medical and hospitalization record Daughter updated on the phone  Clinical Course as of Mar 28 36  Thu Mar 27, 2020  1925 VBG with some hypercapnea with pCO2 of 69, likely chronic component with bicarb of 38.7.  pH 7.35, not acidotic.     [MT]  2004 Cr near recent baseline at 1.7.  Trop only mildly elevated.  Lactate wnl.  WBC 10.7.  Does not meet SIRS criteria at this time - doubt sepsis.  Will obtain CTH and CT chest to re-evaluate for signs of ICH and for aspiration PNA.   [MT]  2004  IMPRESSION: Atrophy, chronic microvascular disease. No acute intracranial abnormality.   [MT]  2021 IMPRESSION: 1. Limited evaluation of the lung parenchyma limited by extensive respiratory motion artifact. 2. Some  atelectatic changes are present in the lung bases, right greater than left 3. No focal consolidative opacity to suggest an aspiration pneumonia at this time. 4. Marked central pulmonary vascular congestion and redistribution cephalized vessels as well as some mild fissural thickening. Minimal if any significant interlobular septal thickening. Could reflect some mild interstitial edema/features of CHF. 5. Cardiomegaly with predominantly right heart enlargement. Coronary artery calcifications are present. Please note that the presence of coronary artery calcium documents the presence of coronary artery disease, the severity of this disease and any potential stenosis cannot be assessed on  this non-gated CT examination. 6. Aortic Atherosclerosis (ICD10-I70.0).   [MT]  2051 PCO2 improving with supplemental oxygen, I will discuss with medicine admission team, as he appears overall stable.  This may be a mixed CHF exacerbation with some hypercapnea and his baseline encephalopathy.   [MT]  2059 Lactic Acid, Venous: 0.9 [MT]  2101 IMPRESSION: Atrophy, chronic microvascular disease.  No acute intracranial abnormality   [MT]  2225 Signed out to IM service   [MT]  2300 Mental status did seemed improved at time of signout.  Patient still somnolent and appears confused but able to verbalize "thank you for what you do" when I re-evaluated him.     [MT]    Clinical Course User Index [MT] Kourtney Montesinos, Carola Rhine, MD    Final Clinical Impression(s) / ED Diagnoses Final diagnoses:  Encephalopathy  Acute on chronic congestive heart failure, unspecified heart failure type [facility])    Rx / DC Orders ED Discharge Orders    None       Wyvonnia Dusky, MD 03/28/20 [phone]

## 2020-03-27 NOTE — ED Triage Notes (Signed)
Pt bib ems from [facility] with reports of resp distress. Pt with increased wob. Pt with yes/no verbal responses. RR 35, ETCo2 55. 90% RA, 95% 3L [place]. Hx CVA with R sided weakness. Pt seen recently for eye infection. Pt also has Gtube. Pt afebrile with EMS. Paced rhythm. 150/90. CBG 157.

## 2020-03-28 DIAGNOSIS — Z931 Gastrostomy status: Secondary | ICD-10-CM | POA: Diagnosis not present

## 2020-03-28 DIAGNOSIS — E1122 Type 2 diabetes mellitus with diabetic chronic kidney disease: Secondary | ICD-10-CM

## 2020-03-28 DIAGNOSIS — H548 Legal blindness, as defined in USA: Secondary | ICD-10-CM | POA: Diagnosis present

## 2020-03-28 DIAGNOSIS — Z87891 Personal history of nicotine dependence: Secondary | ICD-10-CM | POA: Diagnosis not present

## 2020-03-28 DIAGNOSIS — I130 Hypertensive heart and chronic kidney disease with heart failure and stage 1 through stage 4 chronic kidney disease, or unspecified chronic kidney disease: Principal | ICD-10-CM

## 2020-03-28 DIAGNOSIS — M109 Gout, unspecified: Secondary | ICD-10-CM | POA: Diagnosis present

## 2020-03-28 DIAGNOSIS — Z9989 Dependence on other enabling machines and devices: Secondary | ICD-10-CM

## 2020-03-28 DIAGNOSIS — G4733 Obstructive sleep apnea (adult) (pediatric): Secondary | ICD-10-CM | POA: Diagnosis not present

## 2020-03-28 DIAGNOSIS — Z79899 Other long term (current) drug therapy: Secondary | ICD-10-CM | POA: Diagnosis not present

## 2020-03-28 DIAGNOSIS — K219 Gastro-esophageal reflux disease without esophagitis: Secondary | ICD-10-CM | POA: Diagnosis present

## 2020-03-28 DIAGNOSIS — E038 Other specified hypothyroidism: Secondary | ICD-10-CM | POA: Diagnosis not present

## 2020-03-28 DIAGNOSIS — Z8673 Personal history of transient ischemic attack (TIA), and cerebral infarction without residual deficits: Secondary | ICD-10-CM | POA: Diagnosis not present

## 2020-03-28 DIAGNOSIS — I5031 Acute diastolic (congestive) heart failure: Secondary | ICD-10-CM | POA: Diagnosis not present

## 2020-03-28 DIAGNOSIS — N1832 Chronic kidney disease, stage 3b: Secondary | ICD-10-CM | POA: Diagnosis present

## 2020-03-28 DIAGNOSIS — Z23 Encounter for immunization: Secondary | ICD-10-CM | POA: Diagnosis not present

## 2020-03-28 DIAGNOSIS — E6609 Other obesity due to excess calories: Secondary | ICD-10-CM

## 2020-03-28 DIAGNOSIS — J9601 Acute respiratory failure with hypoxia: Secondary | ICD-10-CM | POA: Diagnosis present

## 2020-03-28 DIAGNOSIS — Z9981 Dependence on supplemental oxygen: Secondary | ICD-10-CM | POA: Diagnosis not present

## 2020-03-28 DIAGNOSIS — Z950 Presence of cardiac pacemaker: Secondary | ICD-10-CM

## 2020-03-28 DIAGNOSIS — Z6841 Body Mass Index (BMI) 40.0 and over, adult: Secondary | ICD-10-CM | POA: Diagnosis not present

## 2020-03-28 DIAGNOSIS — Z794 Long term (current) use of insulin: Secondary | ICD-10-CM

## 2020-03-28 DIAGNOSIS — I43 Cardiomyopathy in diseases classified elsewhere: Secondary | ICD-10-CM

## 2020-03-28 DIAGNOSIS — J9621 Acute and chronic respiratory failure with hypoxia: Secondary | ICD-10-CM

## 2020-03-28 DIAGNOSIS — I5033 Acute on chronic diastolic (congestive) heart failure: Secondary | ICD-10-CM | POA: Diagnosis present

## 2020-03-28 DIAGNOSIS — Z20822 Contact with and (suspected) exposure to covid-19: Secondary | ICD-10-CM | POA: Diagnosis present

## 2020-03-28 DIAGNOSIS — J449 Chronic obstructive pulmonary disease, unspecified: Secondary | ICD-10-CM | POA: Diagnosis present

## 2020-03-28 DIAGNOSIS — E1165 Type 2 diabetes mellitus with hyperglycemia: Secondary | ICD-10-CM

## 2020-03-28 DIAGNOSIS — E1142 Type 2 diabetes mellitus with diabetic polyneuropathy: Secondary | ICD-10-CM | POA: Diagnosis present

## 2020-03-28 DIAGNOSIS — E662 Morbid (severe) obesity with alveolar hypoventilation: Secondary | ICD-10-CM | POA: Diagnosis present

## 2020-03-28 DIAGNOSIS — G9340 Encephalopathy, unspecified: Secondary | ICD-10-CM | POA: Diagnosis present

## 2020-03-28 DIAGNOSIS — E785 Hyperlipidemia, unspecified: Secondary | ICD-10-CM | POA: Diagnosis present

## 2020-03-28 DIAGNOSIS — D649 Anemia, unspecified: Secondary | ICD-10-CM

## 2020-03-28 DIAGNOSIS — J9622 Acute and chronic respiratory failure with hypercapnia: Secondary | ICD-10-CM

## 2020-03-28 DIAGNOSIS — I2510 Atherosclerotic heart disease of native coronary artery without angina pectoris: Secondary | ICD-10-CM | POA: Diagnosis present

## 2020-03-28 DIAGNOSIS — Z7982 Long term (current) use of aspirin: Secondary | ICD-10-CM | POA: Diagnosis not present

## 2020-03-28 LAB — GLUCOSE, CAPILLARY
Glucose-Capillary: 124 mg/dL — ABNORMAL HIGH (ref 70–99)
Glucose-Capillary: 128 mg/dL — ABNORMAL HIGH (ref 70–99)
Glucose-Capillary: 128 mg/dL — ABNORMAL HIGH (ref 70–99)

## 2020-03-28 MED ORDER — FEBUXOSTAT 40 MG PO TABS
80.0000 mg | ORAL_TABLET | Freq: Every day | ORAL | Status: DC
  Administered 2020-03-28 – 2020-04-01 (×5): 80 mg
  Filled 2020-03-28 (×6): qty 2

## 2020-03-28 MED ORDER — POLYETHYLENE GLYCOL 3350 17 G PO PACK: 17.0000 g | PACK | Freq: Every day | ORAL | Status: DC | PRN

## 2020-03-28 MED ORDER — FUROSEMIDE 10 MG/ML IJ SOLN
80.0000 mg | Freq: Once | INTRAMUSCULAR | Status: AC
  Administered 2020-03-28: 80 mg via INTRAVENOUS
  Filled 2020-03-28: qty 8

## 2020-03-28 MED ORDER — INSULIN ASPART 100 UNIT/ML ~~LOC~~ SOLN
6.0000 [IU] | SUBCUTANEOUS | Status: DC
  Administered 2020-03-28 – 2020-04-02 (×21): 6 [IU] via SUBCUTANEOUS

## 2020-03-28 MED ORDER — PROSOURCE TF PO LIQD
45.0000 mL | Freq: Three times a day (TID) | ORAL | Status: DC
  Administered 2020-03-28 – 2020-04-02 (×16): 45 mL
  Filled 2020-03-28 (×19): qty 45

## 2020-03-28 MED ORDER — INSULIN ASPART 100 UNIT/ML ~~LOC~~ SOLN
0.0000 [IU] | SUBCUTANEOUS | Status: DC
  Administered 2020-03-28 – 2020-04-02 (×9): 3 [IU] via SUBCUTANEOUS

## 2020-03-28 MED ORDER — ACETAMINOPHEN 160 MG/5ML PO SOLN
1000.0000 mg | Freq: Two times a day (BID) | ORAL | Status: DC
  Administered 2020-03-28 – 2020-04-02 (×11): 1000 mg
  Filled 2020-03-28 (×11): qty 40.6

## 2020-03-28 MED ORDER — INSULIN DETEMIR 100 UNIT/ML ~~LOC~~ SOLN
35.0000 [IU] | Freq: Every day | SUBCUTANEOUS | Status: DC
  Administered 2020-03-28 – 2020-04-01 (×5): 35 [IU] via SUBCUTANEOUS
  Filled 2020-03-28 (×6): qty 0.35

## 2020-03-28 MED ORDER — AMLODIPINE BESYLATE 10 MG PO TABS
10.0000 mg | ORAL_TABLET | Freq: Every day | ORAL | Status: DC
  Administered 2020-03-28 – 2020-04-02 (×6): 10 mg
  Filled 2020-03-28 (×6): qty 1

## 2020-03-28 MED ORDER — ENOXAPARIN SODIUM 40 MG/0.4ML ~~LOC~~ SOLN
40.0000 mg | Freq: Every day | SUBCUTANEOUS | Status: DC
  Administered 2020-03-28 – 2020-04-02 (×6): 40 mg via SUBCUTANEOUS
  Filled 2020-03-28 (×6): qty 0.4

## 2020-03-28 MED ORDER — CARVEDILOL 12.5 MG PO TABS
12.5000 mg | ORAL_TABLET | Freq: Two times a day (BID) | ORAL | Status: DC
  Administered 2020-03-28 – 2020-04-02 (×11): 12.5 mg
  Filled 2020-03-28 (×11): qty 1

## 2020-03-28 MED ORDER — FAMOTIDINE 20 MG PO TABS
20.0000 mg | ORAL_TABLET | Freq: Every day | ORAL | Status: DC
  Administered 2020-03-28 – 2020-04-02 (×6): 20 mg
  Filled 2020-03-28 (×6): qty 1

## 2020-03-28 MED ORDER — GLUCERNA 1.2 CAL PO LIQD
1000.0000 mL | ORAL | Status: DC
  Administered 2020-03-28 – 2020-04-01 (×4): 1000 mL
  Filled 2020-03-28 (×11): qty 1000

## 2020-03-28 MED ORDER — HYDRALAZINE HCL 50 MG PO TABS
50.0000 mg | ORAL_TABLET | Freq: Four times a day (QID) | ORAL | Status: DC
  Administered 2020-03-28 – 2020-04-02 (×21): 50 mg
  Filled 2020-03-28 (×19): qty 1

## 2020-03-28 MED ORDER — TORSEMIDE 20 MG PO TABS
40.0000 mg | ORAL_TABLET | Freq: Two times a day (BID) | ORAL | Status: DC
  Administered 2020-03-28 – 2020-04-02 (×11): 40 mg
  Filled 2020-03-28 (×11): qty 2

## 2020-03-28 MED ORDER — ACETAMINOPHEN 650 MG RE SUPP: 650.0000 mg | Freq: Four times a day (QID) | RECTAL | Status: DC | PRN

## 2020-03-28 MED ORDER — INSULIN ASPART 100 UNIT/ML ~~LOC~~ SOLN: 6.0000 [IU] | SUBCUTANEOUS | Status: DC

## 2020-03-28 MED ORDER — INSULIN DETEMIR 100 UNIT/ML ~~LOC~~ SOLN
35.0000 [IU] | Freq: Two times a day (BID) | SUBCUTANEOUS | Status: DC
  Filled 2020-03-28: qty 0.35

## 2020-03-28 MED ORDER — ACETAMINOPHEN 325 MG PO TABS: 650.0000 mg | ORAL_TABLET | Freq: Four times a day (QID) | ORAL | Status: DC | PRN

## 2020-03-28 MED ORDER — FREE WATER
200.0000 mL | Status: DC
  Administered 2020-03-28 – 2020-04-02 (×29): 200 mL

## 2020-03-28 MED ORDER — IPRATROPIUM-ALBUTEROL 0.5-2.5 (3) MG/3ML IN SOLN: 3.0000 mL | Freq: Four times a day (QID) | RESPIRATORY_TRACT | Status: DC | PRN

## 2020-03-28 MED ORDER — ASPIRIN 81 MG PO CHEW
81.0000 mg | CHEWABLE_TABLET | Freq: Every day | ORAL | Status: DC
  Administered 2020-03-28 – 2020-04-02 (×6): 81 mg
  Filled 2020-03-28 (×6): qty 1

## 2020-03-28 MED ORDER — GLUCERNA 1.2 CAL PO LIQD
1000.0000 mL | ORAL | Status: DC
  Filled 2020-03-28: qty 1000

## 2020-03-28 NOTE — Progress Notes (Signed)
Subjective:  Interviewed patient at bedside.  He was asleep when we were in the room. He was on BiPAP and resting comfortably.  Objective:  Vital signs in last 24 hours: Vitals:   03/28/20 0225 03/28/20 0317 03/28/20 0354 03/28/20 0534  BP: (!) 107/56 (!) 112/53 (!) 121/51 (!) 144/77  Pulse: 60 63 (!) 52 61  Resp: (!) 21 19 20 18   Temp:    98.3 F (36.8 C)  TempSrc:    Axillary  SpO2: 98% 98% 98% 100%  Weight:    131.3 kg  Height:    5\' 9"  (1.753 m)   Physical Exam Vitals and nursing note reviewed.  Constitutional:      General: He is not in acute distress.    Appearance: Normal appearance. He is obese. He is not ill-appearing or toxic-appearing.  HENT:     Head: Normocephalic and atraumatic.  Cardiovascular:     Rate and Rhythm: Normal rate and regular rhythm.     Pulses: Normal pulses.          Radial pulses are 2+ on the right side and 2+ on the left side.       Dorsalis pedis pulses are 2+ on the right side and 2+ on the left side.     Heart sounds: Normal heart sounds, S1 normal and S2 normal. No murmur heard.   Abdominal:     Comments: PEG tube in place  Musculoskeletal:     Right lower leg: No edema.     Left lower leg: No edema.      Assessment/Plan:  Active Problems:   COPD (chronic obstructive pulmonary disease) (HCC)   OSA (obstructive sleep apnea)   Diabetes mellitus type 2 in obese (HCC)   Acute diastolic CHF (congestive heart failure) (HCC)   Acute on chronic respiratory failure with hypoxia and hypercapnia (HCC)   Acute hypoxemic respiratory failure [facility])   [name] is a 74 y.o. person with PMHx blindness, OSA on CPAP, COPD on 2L supplemental O2, TIIDM, paroxysmal atrial fibrillation, hypertensive cardiomyopathy w/ pacemaker, recent bilateral intraventricular hemorrhage 01/26/20, CKD, PEG tube placed July 2019, HTN, gout, and recent hospitalization for aspiration PNA, presenting from his nursing facility due to shortness of breath.     Acute on chronic, hypoxic, hypercarbic respiratory failure: Continues to exhibit signs of hypercapnia associated with morbid obesity that is causing thoracic restriction.  Interruption or failure to provide NIV would quickly lead to exacerbation of the patient's condition, hospital admission, and likely harm to the patient. Continued use is preferred. The use of the NIV will treat patient's high PC02 levels and can reduce risk of exacerbations and future hospitalizations when used at night and during the day. BiLevel/RAD has been considered and ruled out as patient requires continuous alarms, backup battery, and portability which are not possible with BiLevel/RAD devices. Ventilation is required to decrease the work of breathing and improve pulmonary status. Interruption of ventilator support would lead to decline of health status. Patient is able to protect their airways and clear secretions on their own.Was on BiPAP, will have BiPAP stopped and attempt nasal canula. No active cough on examination. -Duoneb q6hr PRN for wheezing/SOB -Attempt to wean of BiPAP to nasal canula this morning  -Check U/A to r/o infectious cause   Mild CHF Exacerbation: Appears Euvolemic on exam today. Will restart home dosing of diuretics. -Restart torsemide 40 mg BID -Monitor and reassess volume status daily -Daily weights, strict I&O -Check BMP daily   OSA  on CPAP: Patient was continued on CPAP at facility. Requiring BIPAP in the hospital. Will begin paperwork for BiPAP machine. -Continue BIPAP -Follow up VBG    Uncontrolled IDDM type II Blood glucose 142. Last HgbA1c 8.9 on 01/26/20. Patient takes 6U novolog QID with twice daily sliding scale insulin [4U if 201-300, 8U if 301-400, 12U if >400] and levemir 40U SQ BID. -Will start home levemir and SSI w/ tube feeds   CKD stage 3b:  Creatinine 1.72, BUN 46, GFR 45. Potassium 4.2. Albumin 2.9. Patient takes torsemide 40mg  BID at facility. Given 60mg  IV  Lasix in ED.  -Will give additional 80mg  Lasix IV  -Reassess volume status in morning   Hypertensive Cardiomyopathy s/p Pacemaker Placement: Patient has had bradycardic episodes to the 50's although pacer does reset rhythm to 62bpm.  -Continue to monitor    Well-Controlled Hypertension: Blood pressures well controlled on home regimen of amlodipine 10mg , hydralazine 50mg  QID, and carvedilol 12.5mg  BID at facility.  -stop hydralazine 50mg  QID -continue carvedilol 12.5mg  BID and amlodipine 10mg  daily   Stable Normocytic Anemia: Hemoglobin 10.8, MCV 87.8. Likely due to anemia of chronic disease. -Check morning CBC   Gout: -Continue home 80mg  febuxostat daily    Diet: PEG tube feedings IVF: None DVT PPx: Enoxaparin 40mg  SQ daily Code Status: Full code   Prior to Admission Living Arrangement: SNF Anticipated Discharge Location: SNF Barriers to Discharge: BiPAP requirements Dispo: Anticipated discharge in approximately 2-3 day(s).   Briant Cedar, MD 03/28/2020, 6:00 AM Pager: [phone] After 5pm on weekdays and 1pm on weekends: On Call pager [phone]

## 2020-03-28 NOTE — ED Notes (Signed)
Paged IM to RN [facility]

## 2020-03-28 NOTE — TOC Initial Note (Addendum)
Transition of Care [facility]) - Initial/Assessment Note    Patient Details  Name: [name] MRN: [id number] Date of Birth: June 29, 1946  Transition of Care [facility]) CM/SW Contact:    Verdell Carmine, RN Phone Number: 03/28/2020, 9:18 AM  Clinical Narrative:                 Admitted after a short stint (6 days) at [facility] for Respiratory Failure CO2 elevated to high 60's.. On CPAP there at night as well as o2 during day. Patient may need trilogy (bipap) will need to work through [employer] and one of agencies for authorization  to qualify.   Will [place] with attending and CSW to make sure [place] will  Be able to accept patient back with addition of trilogy should this be the final recommendation.  Spoke to [place] at [facility]. They do not do NIV. Will have to plan to place patient somewhere else. Spoke to [place] at [facility]  to order the NIV she will get her team to order, if they have any questions will call Andree Coss.from [employer]. [phone] Expected Discharge Plan: Skilled Nursing Facility Barriers to Discharge: Continued Medical Work up   Patient Goals and CMS Choice        Expected Discharge Plan and Services Expected Discharge Plan: [place] arrangements for the past 2 months: [place]                                      Prior Living Arrangements/Services Living arrangements for the past 2 months: [place] Lives with:: Facility Resident          Need for Family Participation in Patient Care: Yes (Comment) Care giver support system in place?: Yes (comment)   Criminal Activity/Legal Involvement Pertinent to Current Situation/Hospitalization: No - Comment as needed  Activities of Daily Living      Permission Sought/Granted      Share Information with NAME: Daughter           Emotional Assessment Appearance:: Appears stated age       Alcohol / Substance Use: Not  Applicable Psych Involvement: No (comment)  Admission diagnosis:  Encephalopathy [G93.40] Acute hypoxemic respiratory failure (HCC) [J96.01] Acute on chronic congestive heart failure, unspecified heart failure type [facility]) [I50.9] Patient Active Problem List   Diagnosis Date Noted  . Acute hypoxemic respiratory failure ([place]) 03/28/2020  . PEG (percutaneous endoscopic gastrostomy) status ([place])   . Endotracheal tube present   . Hypervolemia   . Acute respiratory failure ([place])   . Aspiration into airway   . Chronic respiratory failure with hypoxia and hypercapnia (HCC)   . Acute on chronic respiratory failure with hypoxia and hypercapnia (HCC)   . Goals of care, counseling/discussion   . Palliative care by specialist   . Acute diastolic CHF (congestive heart failure) ([place]) 04/28/2019  . Acute on chronic congestive heart failure ([place])   . Heart failure ([place]) 08/31/2018  . Paroxysmal atrial tachycardia ([place]) 05/12/2018  . Aortic valve stenosis, nonrheumatic 05/12/2018  . Diabetes mellitus type 2 in obese ([place]) 05/12/2018  . Chronic diastolic heart failure ([place]) 11/11/2017  . Hypernatremia 08/15/2017  . AKI (acute kidney injury) ([place]) 08/15/2017  . Acute lower UTI 08/15/2017  . Coarse tremors 08/15/2017  . Amiodarone pulmonary toxicity   . Cellulitis   . Acute encephalopathy   .  Acute pulmonary edema (HCC)   . Acute respiratory failure with hypoxia ([place]) 06/13/2017  . SSS (sick sinus syndrome) ([place]) 05/19/2016  . Cough 08/20/2015  . Morbid obesity ([place]) 07/09/2015  . Cardiac mass 10/04/2013  . Intraventricular hemorrhage ([place]) 10/02/2013  . CVA (cerebral infarction) 10/01/2013  . History of intracranial hemorrhage 10/01/2013  . ICH (intracerebral hemorrhage) ([place]) 10/01/2013  . Right knee pain 09/07/2013  . Stage 3b chronic kidney disease 09/07/2013  . Orchitis 09/05/2013  . Unspecified protein-calorie malnutrition ([place]) 09/05/2013  . Sepsis ([place]) 08/27/2013  . Trichomonas  infection 08/27/2013  . Epididymo-orchitis, acute 08/27/2013  . PAF (paroxysmal atrial fibrillation) ([place]) 06/24/2013  . OSA (obstructive sleep apnea) 05/04/2011  . Shortness of breath 03/17/2011  . Depression 10/23/2010  . Degenerative joint disease   . Cardiomyopathy, hypertensive ([place])   . Coronary artery disease involving native coronary artery of native heart without angina pectoris   . Pacemaker   . Incarcerated ventral hernia   . COPD (chronic obstructive pulmonary disease) ([place]) 11/28/2009  . Diabetes mellitus due to underlying condition, controlled, with stage 3 chronic kidney disease, with long-term current use of insulin ([place]) 07/20/2006  . Hypercholesterolemia 07/20/2006  . GOUT 07/20/2006  . Macular degeneration (senile) of retina 07/20/2006  . Essential hypertension 07/20/2006  . BENIGN PROSTATIC HYPERTROPHY, HX OF 07/20/2006   PCP:  [name], MD Pharmacy:  No Pharmacies Listed    Social Determinants of Health (SDOH) Interventions    Readmission Risk Interventions Readmission Risk Prevention Plan 05/08/2019  Register or Home Care Consult Complete  Social Work Consult for [place] Planning/Counseling Complete  Medication Review Press photographer) Complete  Some recent data might be hidden

## 2020-03-28 NOTE — Progress Notes (Signed)
Initial Nutrition Assessment  RD working remotely.  DOCUMENTATION CODES:   Morbid obesity  INTERVENTION:   Tube feeding via PEG: - Glucerna 1.2 @ 65 ml/hr (1560 ml/day) - ProSource TF 45 ml TID - Free water per MD, currently 200 ml q 4 hours  Tube feeding regimen and current free water flushes provide 1992 kcal, 127 grams of protein, and 2456 ml of H2O.   NUTRITION DIAGNOSIS:   Inadequate oral intake related to inability to eat as evidenced by NPO status.  GOAL:   Patient will meet greater than or equal to 90% of their needs  MONITOR:   Labs, Weight trends, TF tolerance, I & O's  REASON FOR ASSESSMENT:   Consult Enteral/tube feeding initiation and management  ASSESSMENT:   74 year old male who presented on 8/12 from SNF with respiratory distress. PMH of blindness, OSA on CPAP, COPD on 2L supplemental O2, T2DM, atrial fibrillation, CKD stage III, recent bilateral [name], PEG tube placement July 2021, HTN, gout, and recent admission for acute hypercapnic and hypoxemic respiratory failure and aspiration pneumonia.   Consult received for TF initiation and management. Will adjust current regimen to better meet pt's needs. Communicated changes to RN via secure chat.  Unable to obtain history from pt at this time. RD working remotely. Reviewed weight readings in chart. Noted pt with progressive weight loss that began in March 2021. Overall, pt with a 20.4 kg weight loss since 10/15/19. This is a 13.4% weight loss in less than 6 months which is significant for timeframe. Pt at risk for malnutrition but RD unable diagnose at this time without further history and/or NFPE.  Current TF orders: Glucerna 1.2 @ 60 ml/hr, free water 200 ml q 4 hours  Medications reviewed and include: pepcid  Labs reviewed.  NUTRITION - FOCUSED PHYSICAL EXAM:  Unable to complete at this time. RD working remotely.  Diet Order:   Diet Order            Diet NPO time specified  Diet effective now                  EDUCATION NEEDS:   No education needs have been identified at this time  Skin:  Skin Assessment: Reviewed RN Assessment (skin tear left leg)  Last BM:  no documented BM  Height:   Ht Readings from Last 1 Encounters:  03/28/20 5\' 9"  (1.753 m)    Weight:   Wt Readings from Last 1 Encounters:  03/28/20 131.3 kg    Ideal Body Weight:  72.7 kg  BMI:  Body mass index is 42.75 kg/m.  Estimated Nutritional Needs:   Kcal:  2000-2200  Protein:  120-140 grams  Fluid:  2.0 L/day    Gaynell Face, MS, RD, LDN Inpatient Clinical Dietitian Please see AMiON for contact information.

## 2020-03-28 NOTE — Plan of Care (Signed)

## 2020-03-28 NOTE — H&P (Signed)
Date: 03/28/2020               Patient Name:  [name] MRN: [id number]  DOB: 1946/04/16 Age / Sex: 74 y.o., male   PCP: [name], MD         Medical Service: Internal Medicine Teaching Service         Attending Physician: Dr. Evette Doffing, Mallie Mussel, *    First Contact: Dr. Collene Gobble Pager: [phone]  Second Contact: Dr. Truman Hayward Pager: [phone]       After Hours (After 5p/  First Contact Pager: [phone]  weekends / holidays): Second Contact Pager: [phone]   Chief Complaint: shortness of breath  History of Present Illness:   [name] is a 74 y.o. person with PMHx blindness, OSA on CPAP, COPD on 2L supplemental O2, TIIDM, paroxysmal atrial fibrillation, hypertensive cardiomyopathy w/ pacemaker, recent bilateral intraventricular hemorrhage 01/26/20, CKD, PEG tube placed July 2019, HTN, gout, and recent hospitalization for aspiration PNA, presenting from his nursing facility due to shortness of breath. Patient's daughter reports that the evening nurse noticed that her father was gasping for air and seemed as though he was having difficulty breathing. She reports that he has become increasingly agitated over the past week, taking off his [place] and CPAP and trying to pull out his feeding tube. She states that at baseline, he is alert and oriented to self, able to speak, and responsive to questions. She is unsure whether his nursing staff have needed to increase his O2 supply at his facility recently, and unsure if they have assessed him for any infections, but she does note they turn him frequently and haven't reported any ulcers. The patient himself does not speak with [place] on examination, but does nod his head to indicate "yes" when questioned if he feels short of breath today. No other history was able to be obtained from the patient.   All history below obtained on chart review:  Reported Home Medications: Current Meds  Medication Sig  . acetaminophen (TYLENOL) 650 MG CR tablet  1,300 mg See admin instructions. 1,300 mg per tube two times a day  . Amino Acids-Protein Hydrolys (PRO-STAT) LIQD Place 30 mLs into feeding tube See admin instructions. Mix 30 ml's of Pro-Stat with 120 ml's of water and give, per PEG-Tube, 2 times a day  . amLODipine (NORVASC) 10 MG tablet Place 1 tablet (10 mg total) into feeding tube daily.  Marland Kitchen aspirin 81 MG chewable tablet Place 81 mg into feeding tube daily.  . carvedilol (COREG) 12.5 MG tablet Place 1 tablet (12.5 mg total) into feeding tube 2 (two) times daily with a meal.  . docusate (COLACE) 50 MG/5ML liquid Place 10 mLs (100 mg total) into feeding tube 2 (two) times daily.  . famotidine (PEPCID) 20 MG tablet Place 1 tablet (20 mg total) into feeding tube daily.  . Febuxostat (ULORIC) 80 MG TABS Place 80 mg into feeding tube at bedtime.   . hydrALAZINE (APRESOLINE) 50 MG tablet Place 1 tablet (50 mg total) into feeding tube every 6 (six) hours. (Patient taking differently: Place 50 mg into feeding tube 4 (four) times daily. )  . insulin aspart (NOVOLOG FLEXPEN) 100 UNIT/ML FlexPen Inject 0-12 Units into the skin See admin instructions. Inject 6 units into the skin every six hours and, per sliding scale twice a day, if BGL <200 = give nothing; 201-300 = 4 extra units; 301-400 = 8 extra units; >400 = 12 units and call MD  .  insulin detemir (LEVEMIR) 100 UNIT/ML injection Inject 0.4 mLs (40 Units total) into the skin 2 (two) times daily. (Patient taking differently: Inject 35 Units into the skin 2 (two) times daily. )  . ipratropium-albuterol (DUONEB) 0.5-2.5 (3) MG/3ML SOLN Take 3 mLs by nebulization every 6 (six) hours as needed (for wheezing or shortness of breath).  . Nutritional Supplements (FEEDING SUPPLEMENT, GLUCERNA 1.2 CAL,) LIQD Place 1,000 mLs into feeding tube continuous. (Patient taking differently: Place 65 mL/hr into feeding tube continuous. )  . OXYGEN Inhale 2-4 L/min into the lungs See admin instructions. 2 L/min continuously and  increase to 4 L/min as needed for shortness of breath  . polyethylene glycol (MIRALAX / GLYCOLAX) 17 g packet 17 g by Per NG tube route daily as needed for mild constipation or moderate constipation. (Patient taking differently: Place 17 g into feeding tube daily. MIX INTO 8 OUNCES OF WATER)  . torsemide (DEMADEX) 20 MG tablet Place 2 tablets (40 mg total) into feeding tube daily. (Patient taking differently: Place 40 mg into feeding tube 2 (two) times daily. )  . Water For Irrigation, Sterile (FREE WATER) SOLN Place 400 mLs into feeding tube every 4 (four) hours. (Patient taking differently: Place 200 mLs into feeding tube every 4 (four) hours. )   Family Hx: Family History  Problem Relation Age of Onset  . Coronary artery disease Sister   . Heart disease Sister   . Allergies Sister   . Asthma Brother   . Rheum arthritis Brother    Social Hx: Patient previously drank alcohol in 2014. He was a former, rare smoker and quit smoking in 2005. No other reported substance use. He currently resides at [facility]. Patient's daughter is his primary contact.   Allergies: Allergies as of 03/27/2020  . (No Known Allergies)   Past Medical History:  Diagnosis Date  . Arthritis    "all over" (06/23/2013)  . Atrial fibrillation ([place])    not Candidate for Coumadin d/t rectal bleeding  . Benign prostatic hypertrophy   . Cardiomyopathy, hypertensive ([place])     diastolic dysfunction by 5397 echo  . Coronary artery disease     nonobstructive. 2 vessel. cardiac catheter in March 2005 showing 60% LAD occlusion, 30% circumflex occlusion.  . Degenerative joint disease     Right knee.  . Diabetes type 2, uncontrolled ([place])   . Diabetic peripheral neuropathy ([place])   . Diabetic ulcer of thigh ([place])    H/O Rt thigh ulcer.  . Exertional shortness of breath    "comes and goes" (06/23/2013)  . GERD (gastroesophageal reflux disease)   . Gout   . Herpes   . Hyperlipidemia   .  Hypertension   . Hypotension 09/05/2013  . Incarcerated ventral hernia    H/O. S/P repair ventral hernia repair 07/2005.  Marland Kitchen LBBB (left bundle branch block)   . Legally blind   . Morbid obesity ([place])   . Obstructive sleep apnea    On CPAP. Last sleep study (06/2009) - Severe obstructive sleep apnea/hypopnea syndrome, apnea-hypopnea index 70.5 per hour with non positional events moderately loud snoring and oxygen desaturation to a nadir of 85% on room air.  . Occult blood positive stool     History of in August 2007.  colonoscopy in January 2008 showing normal colon with fair inadequate prep. By Dr. Silvano Rusk.  . 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 Sepsis ([place]) 08/26/2013   Review of Systems: Full ROS  unable to be obtained 2/2 patient non-responsive.   History and ROS are limited due to patient non-responsiveness.   Physical Exam: Blood pressure (!) 121/51, pulse (!) 52, temperature 97.8 F (36.6 C), temperature source Oral, resp. rate 20, SpO2 98 %. General: Patient is obese. He is laying down in bed, in mild distress.  Eyes: Sclera non-icteric. No conjunctival injection.  HENT: No nasal discharge. Head appears atraumatic.  Respiratory: Anterior lung sounds diffusely decreased but otherwise CTA, bilaterally. No wheezes, rales, or rhonchi.  Cardiovascular: Regular rate and rhythm. There is a 2/6 early systolic murmur heard throughout the chest. No rubs, clicks, or gallops. No lower extremity pitting edema.  Abdominal: Distended but soft, with no TTP. Bowel sounds intact. No rebound or guarding. PEG tube is in place with no active drainage.  Musculoskeletal: There is tenderness upon active lifting of the right lower extremity. No bony TTP. Skin: There is hyperpigmentation of the bilateral shins with a small lesion on the left shin, without excessive erythema, warmth, or drainage. There is mild erythema surrounding PEG tube but no active drainage or noticeable odor. No  jaundice.   EKG: personally reviewed my interpretation is A-V dual-paced rhythm at a rate of 62bpm. Patient sustained rhythm without pacing halfway through EKG monitoring.  Portable CXR: personally reviewed my interpretation is significant vascular congestion with left-sided pleural effusion.   CT Chest wo Contrast:  1. Limited evaluation of the lung parenchyma limited by extensive respiratory motion artifact. 2. Some atelectatic changes are present in the lung bases, right greater than left 3. No focal consolidative opacity to suggest an aspiration pneumonia at this time. 4. Marked central pulmonary vascular congestion and redistribution cephalized vessels as well as some mild fissural thickening. Minimal if any significant interlobular septal thickening. Could reflect some mild interstitial edema/features of CHF. 5. Cardiomegaly with predominantly right heart enlargement. Coronary artery calcifications are present. Please note that the presence of coronary artery calcium documents the presence of coronary artery disease, the severity of this disease and any potential stenosis cannot be assessed on this non-gated CT examination. 6. Aortic Atherosclerosis (ICD10-I70.0).  Electronically Signed   By: Lovena Le M.D.   On: 03/27/2020 20:15  CT Head wo Contrast: Chronic microvascular disease. No acute intracranial abnormality.   Assessment & Plan by Problem: Active Problems:   COPD (chronic obstructive pulmonary disease) (HCC)   OSA (obstructive sleep apnea)   Diabetes mellitus type 2 in obese (HCC)   Acute diastolic CHF (congestive heart failure) (HCC)   Acute on chronic respiratory failure with hypoxia and hypercapnia (HCC)   Acute hypoxemic respiratory failure (HCC)  Acute on chronic, hypoxic, hypercarbic respiratory failure Patient was on 2L supplemental oxygen at facility though required BIPAP for oxygen desaturation to the low 70's on examination. Portable CXR and CT  chest showed pulmonary vascular congestion and RLL atelectasis. Initial VBG showed pH 7.355, pCO2 69.3, pO2 52, bicarb 38.7. BNP 203.6. Troponins 21 > 20. Most likely multifactorial in nature with hx of OSA on CPAP and with mild CHF exacerbation. No active cough on examination, making COPD exacerbation less likely although patient has had increased agitation - consider infectious etiology. - Duoneb q6hr PRN for wheezing/SOB - Continue BIPAP w/ goal SpO2 > 88% - Follow up VBG - Check U/A to r/o infectious cause  Mild CHF Exacerbation  BNP 203.6. Troponins 21 > 20. Potassium 4.2. Patient given 60mg  IV Lasix in ED. - Will give an additional 80mg  Lasix IV  - Reassess volume status  in morning  - Daily weights, strict I&O - Check BMP   OSA on CPAP Patient was continued on CPAP at facility. Requiring BIPAP in the hospital.  - Continue BIPAP - Follow up VBG   Uncontrolled IDDM type II Blood glucose 142. Last HgbA1c 8.9 on 01/26/20. Patient takes 6U novolog QID with twice daily sliding scale insulin [4U if 201-300, 8U if 301-400, 12U if >400] and levemir 40U SQ BID. - Will start home levemir and SSI w/ tube feeds  CKD stage 3b  Creatinine 1.72, BUN 46, GFR 45. Potassium 4.2. Albumin 2.9. Patient takes torsemide 40mg  BID at facility. Given 60mg  IV Lasix in ED.  - Will give additional 80mg  Lasix IV  - Reassess volume status in morning  Hypertensive Cardiomyopathy s/p Pacemaker Placement Patient has had bradycardic episodes to the 50's although pacer does reset rhythm to 62bpm.  - Continue to monitor    Well-Controlled Hypertension Blood pressures well controlled on home regimen of amlodipine 10mg , hydralazine 50mg  QID, and carvedilol 12.5mg  BID at facility.  - stop hydralazine 50mg  QID - continue carvedilol 12.5mg  BID and amlodipine 10mg  daily  Stable Normocytic Anemia Hemoglobin 10.8, MCV 87.8. Likely due to anemia of chronic disease. - Check morning CBC  Gout - Continue home 80mg   febuxostat daily   Diet: PEG tube feedings IVF: None DVT PPx: Enoxaparin 40mg  SQ daily Code Status: Full code   Dispo: Admit patient to Observation with expected length of stay less than 2 midnights.  Signed: Jeralyn Bennett, MD 03/28/2020, 5:10 AM  Pager: [phone] After 5pm on weekdays and 1pm on weekends: On Call pager: [phone]

## 2020-03-28 NOTE — NC FL2 (Signed)
[place] LEVEL OF CARE SCREENING TOOL     IDENTIFICATION  Patient Name: [name] Birthdate: 02-Mar-1946 Sex: male Admission Date (Current Location): 03/27/2020  [facility] and [place] Number:  Herbalist and Address:  [facility], [place] [address] [place], [place] [address]      Provider Number: [id number]  Attending Physician Name and Address:  Axel Filler, *  Relative Name and Phone Number:  [name], daughter    Current Level of Care: Hospital Recommended Level of Care: [place] Prior Approval Number:    Date Approved/Denied:   PASRR Number: [id number]  Discharge Plan: SNF    Current Diagnoses: Patient Active Problem List   Diagnosis Date Noted  . Acute hypoxemic respiratory failure ([place]) 03/28/2020  . PEG (percutaneous endoscopic gastrostomy) status ([place])   . Endotracheal tube present   . Hypervolemia   . Acute respiratory failure ([place])   . Aspiration into airway   . Chronic respiratory failure with hypoxia and hypercapnia (HCC)   . Acute on chronic respiratory failure with hypoxia and hypercapnia (HCC)   . Goals of care, counseling/discussion   . Palliative care by specialist   . Acute diastolic CHF (congestive heart failure) ([place]) 04/28/2019  . Acute on chronic congestive heart failure ([place])   . Heart failure ([place]) 08/31/2018  . Paroxysmal atrial tachycardia ([place]) 05/12/2018  . Aortic valve stenosis, nonrheumatic 05/12/2018  . Diabetes mellitus type 2 in obese ([place]) 05/12/2018  . Chronic diastolic heart failure ([place]) 11/11/2017  . Hypernatremia 08/15/2017  . AKI (acute kidney injury) ([place]) 08/15/2017  . Acute lower UTI 08/15/2017  . Coarse tremors 08/15/2017  . Amiodarone pulmonary toxicity   . Cellulitis   . Acute encephalopathy   . Acute pulmonary edema (HCC)   . Acute respiratory failure with hypoxia ([place]) 06/13/2017  . SSS (sick sinus syndrome) ([place]) 05/19/2016  . Cough  08/20/2015  . Morbid obesity ([place]) 07/09/2015  . Cardiac mass 10/04/2013  . Intraventricular hemorrhage ([place]) 10/02/2013  . CVA (cerebral infarction) 10/01/2013  . History of intracranial hemorrhage 10/01/2013  . ICH (intracerebral hemorrhage) ([place]) 10/01/2013  . Right knee pain 09/07/2013  . Stage 3b chronic kidney disease 09/07/2013  . Orchitis 09/05/2013  . Unspecified protein-calorie malnutrition ([place]) 09/05/2013  . Sepsis ([place]) 08/27/2013  . Trichomonas infection 08/27/2013  . Epididymo-orchitis, acute 08/27/2013  . PAF (paroxysmal atrial fibrillation) ([place]) 06/24/2013  . OSA (obstructive sleep apnea) 05/04/2011  . Shortness of breath 03/17/2011  . Depression 10/23/2010  . Degenerative joint disease   . Cardiomyopathy, hypertensive ([place])   . Coronary artery disease involving native coronary artery of native heart without angina pectoris   . Pacemaker   . Incarcerated ventral hernia   . COPD (chronic obstructive pulmonary disease) ([place]) 11/28/2009  . Diabetes mellitus due to underlying condition, controlled, with stage 3 chronic kidney disease, with long-term current use of insulin ([place]) 07/20/2006  . Hypercholesterolemia 07/20/2006  . GOUT 07/20/2006  . Macular degeneration (senile) of retina 07/20/2006  . Essential hypertension 07/20/2006  . BENIGN PROSTATIC HYPERTROPHY, HX OF 07/20/2006    Orientation RESPIRATION BLADDER Height & Weight      (Responds to voice)  O2 (Nasal Cannula) Incontinent, External catheter Weight: 289 lb 7.4 oz (131.3 kg) Height:  5\' 9"  (175.3 cm)  BEHAVIORAL SYMPTOMS/MOOD NEUROLOGICAL BOWEL NUTRITION STATUS      Continent Feeding tube (PEG)  AMBULATORY STATUS COMMUNICATION OF NEEDS Skin   Total Care Does not communicate Skin  abrasions (Left leg skin tear)                       Personal Care Assistance Level of Assistance  Bathing, Feeding, Dressing, Total care Bathing Assistance: Maximum assistance Feeding assistance: Maximum  assistance Dressing Assistance: Maximum assistance Total Care Assistance: Maximum assistance   Functional Limitations Info  Sight, Hearing, Speech Sight Info: Impaired (Blind) Hearing Info: Adequate Speech Info: Impaired    SPECIAL CARE FACTORS FREQUENCY                       Contractures Contractures Info: Not present    Additional Factors Info  Allergies, Code Status Code Status Info: FULL Allergies Info: NKA           Current Medications (03/28/2020):  This is the current hospital active medication list Current Facility-Administered Medications  Medication Dose Route Frequency Provider Last Rate Last Admin  . acetaminophen (TYLENOL) 160 MG/5ML solution 1,000 mg  1,000 mg Per Tube BID Seawell, Jaimie A, DO   1,000 mg at 03/28/20 0924  . acetaminophen (TYLENOL) tablet 650 mg  650 mg Oral Q6H PRN Seawell, Jaimie A, DO       Or  . acetaminophen (TYLENOL) suppository 650 mg  650 mg Rectal Q6H PRN Seawell, Jaimie A, DO      . amLODipine (NORVASC) tablet 10 mg  10 mg Per Tube Daily Seawell, Jaimie A, DO   10 mg at 03/28/20 0924  . aspirin chewable tablet 81 mg  81 mg Per Tube Daily Seawell, Jaimie A, DO   81 mg at 03/28/20 0924  . carvedilol (COREG) tablet 12.5 mg  12.5 mg Per Tube BID WC Seawell, Jaimie A, DO   12.5 mg at 03/28/20 0924  . enoxaparin (LOVENOX) injection 40 mg  40 mg Subcutaneous Daily Seawell, Jaimie A, DO   40 mg at 03/28/20 0923  . famotidine (PEPCID) tablet 20 mg  20 mg Per Tube Daily Seawell, Jaimie A, DO   20 mg at 03/28/20 0924  . febuxostat (ULORIC) tablet 80 mg  80 mg Per Tube QHS Seawell, Jaimie A, DO      . feeding supplement (GLUCERNA 1.2 CAL) liquid 1,000 mL  1,000 mL Per Tube Continuous Briant Cedar, MD 65 mL/hr at 03/28/20 1126 1,000 mL at 03/28/20 1126  . feeding supplement (PROSource TF) liquid 45 mL  45 mL Per Tube TID Briant Cedar, MD      . free water 200 mL  200 mL Per Tube Q4H Seawell, Jaimie A, DO      . hydrALAZINE  (APRESOLINE) tablet 50 mg  50 mg Per Tube Q6H Seawell, Jaimie A, DO   50 mg at 03/28/20 1112  . ipratropium-albuterol (DUONEB) 0.5-2.5 (3) MG/3ML nebulizer solution 3 mL  3 mL Nebulization Q6H PRN Seawell, Jaimie A, DO      . polyethylene glycol (MIRALAX / GLYCOLAX) packet 17 g  17 g Per NG tube Daily PRN Seawell, Jaimie A, DO      . torsemide (DEMADEX) tablet 40 mg  40 mg Per Tube BID Briant Cedar, MD   40 mg at 03/28/20 1111     Discharge Medications: Please see discharge summary for a list of discharge medications.  Relevant Imaging Results:  Relevant Lab Results:   Additional Information SSN  Waylan B[place], [place]

## 2020-03-28 NOTE — TOC Progression Note (Signed)
Transition of Care (TOC) - Progression Note    Patient Details  Name: [name] MRN: [id number] Date of Birth: 08/15/1946  Transition of Care (TOC) CM/SW Contact  Chaney J Johnson, LCSWA Phone Number: 03/28/2020, 3:36 PM  Clinical Narrative:    CSW met with patient and patient's daughter [name] bedside. Patient's daughter expressed understanding that [name] can not take patient with the Bipap. She is open to sending referral to other [facility] contracted facilities. CSW reached out to patient's [facility] SW and left a voicemail. TOC team will continue to follow.   Expected Discharge Plan: Skilled Nursing Facility Barriers to Discharge: Continued Medical Work up  Expected Discharge Plan and Services Expected Discharge Plan: Skilled Nursing Facility       Living arrangements for the past 2 months: Skilled Nursing Facility                                       Social Determinants of Health (SDOH) Interventions    Readmission Risk Interventions Readmission Risk Prevention Plan 05/08/2019  HRI or Home Care Consult Complete  Social Work Consult for Recovery Care Planning/Counseling Complete  Medication Review (RN Care Manager) Complete  Some recent data might be hidden   

## 2020-03-28 NOTE — Hospital Course (Addendum)
[name] continues to exhibit signs of hypercapnia associated with morbid obesity that is causing thoracic restriction.  Interruption or failure to provide NIV would quickly lead to exacerbation of the patient's condition, hospital admission, and likely harm to the patient. Continued use is preferred.  The use of the NIV will treat patient's high PC02 levels and can reduce risk of exacerbations and future hospitalizations when used at night and during the day. BiLevel/RAD has been considered and ruled out as patient requires continuous alarms, backup battery, and portability which are not possible with BiLevel/RAD devices. Ventilation is required to decrease the work of breathing and improve pulmonary status. Interruption of ventilator support would lead to decline of health status. Patient is able to protect their airways and clear secretions on their own.

## 2020-03-29 DIAGNOSIS — E038 Other specified hypothyroidism: Secondary | ICD-10-CM

## 2020-03-29 DIAGNOSIS — E662 Morbid (severe) obesity with alveolar hypoventilation: Secondary | ICD-10-CM

## 2020-03-29 DIAGNOSIS — I615 Nontraumatic intracerebral hemorrhage, intraventricular: Secondary | ICD-10-CM

## 2020-03-29 LAB — GLUCOSE, CAPILLARY
Glucose-Capillary: 73 mg/dL (ref 70–99)
Glucose-Capillary: 79 mg/dL (ref 70–99)
Glucose-Capillary: 109 mg/dL — ABNORMAL HIGH (ref 70–99)
Glucose-Capillary: 127 mg/dL — ABNORMAL HIGH (ref 70–99)
Glucose-Capillary: 140 mg/dL — ABNORMAL HIGH (ref 70–99)

## 2020-03-29 LAB — BASIC METABOLIC PANEL
Anion gap: 12 (ref 5–15)
BUN: 50 mg/dL — ABNORMAL HIGH (ref 8–23)
CO2: 32 mmol/L (ref 22–32)
Calcium: 9.6 mg/dL (ref 8.9–10.3)
Chloride: 101 mmol/L (ref 98–111)
Creatinine, Ser: 1.88 mg/dL — ABNORMAL HIGH (ref 0.61–1.24)
GFR calc Af Amer: 40 mL/min — ABNORMAL LOW (ref >60)
GFR calc non Af Amer: 35 mL/min — ABNORMAL LOW (ref >60)
Glucose, Bld: 115 mg/dL — ABNORMAL HIGH (ref 70–99)
Potassium: 3.9 mmol/L (ref 3.5–5.1)
Sodium: 145 mmol/L (ref 135–145)

## 2020-03-29 LAB — CBC WITH DIFFERENTIAL/PLATELET
Abs Immature Granulocytes: 0.04 10*3/uL (ref 0.00–0.07)
Basophils Absolute: 0.1 10*3/uL (ref 0.0–0.1)
Basophils Relative: 1 %
Eosinophils Absolute: 0.1 10*3/uL (ref 0.0–0.5)
Eosinophils Relative: 1 %
HCT: 34.3 % — ABNORMAL LOW (ref 39.0–52.0)
Hemoglobin: 9.7 g/dL — ABNORMAL LOW (ref 13.0–17.0)
Immature Granulocytes: 0 %
Lymphocytes Relative: 18 %
Lymphs Abs: 1.7 10*3/uL (ref 0.7–4.0)
MCH: 24.3 pg — ABNORMAL LOW (ref 26.0–34.0)
MCHC: 28.3 g/dL — ABNORMAL LOW (ref 30.0–36.0)
MCV: 85.8 fL (ref 80.0–100.0)
Monocytes Absolute: 0.9 10*3/uL (ref 0.1–1.0)
Monocytes Relative: 10 %
Neutro Abs: 6.5 10*3/uL (ref 1.7–7.7)
Neutrophils Relative %: 70 %
Platelets: 130 10*3/uL — ABNORMAL LOW (ref 150–400)
RBC: 4 MIL/uL — ABNORMAL LOW (ref 4.22–5.81)
RDW: 17.9 % — ABNORMAL HIGH (ref 11.5–15.5)
WBC: 9.4 10*3/uL (ref 4.0–10.5)
nRBC: 0 % (ref 0.0–0.2)

## 2020-03-29 LAB — TSH: TSH: 6.213 u[IU]/mL — ABNORMAL HIGH (ref 0.350–4.500)

## 2020-03-29 LAB — T4, FREE: Free T4: 1 ng/dL (ref 0.61–1.12)

## 2020-03-29 LAB — MAGNESIUM: Magnesium: 2.5 mg/dL — ABNORMAL HIGH (ref 1.7–2.4)

## 2020-03-29 NOTE — Plan of Care (Signed)

## 2020-03-29 NOTE — Progress Notes (Signed)
RT placed pt on BIPAP for the night on settings of 16/6 BUR 16 40%. Pt respiratory status is stable at this time. RT will continue to monitor.

## 2020-03-29 NOTE — Progress Notes (Signed)
   Subjective:  [name] is a 74 y.o. with PMH of OSA on CPAP, OHS, COPD, T2DM, PAF, NICM, Bilateral intraventricular hemorrhage 2 months prior admit for acute on chronic respiratory failure on hospital day 1  [name] was examined and evaluated at bedside this am. He is able to answer questions and follow directions but noted to be intermittently somnolent and unresponsive although he awakens easily to voice. He is unable to describe any specific complaints overnight.   Objective:  Vital signs in last 24 hours: Vitals:   03/29/20 0757 03/29/20 1020 03/29/20 1050 03/29/20 1439  BP:   124/81 (!) 128/58  Pulse: (!) 51 (!) 59 (!) 58 61  Resp: 19 (!) 22 20 (!) 22  Temp:   98 F (36.7 C) 98.7 F (37.1 C)  TempSrc:   Axillary Oral  SpO2: 100% 100% 100% (!) 89%  Weight:      Height:       Gen: Well-developed, well nourished, NAD HEENT: NCAT head, hearing intact, EOMI, MMM Pulm: Breathing comfortably on Nasal cannula 2L Extm: Soft restraints in place, no pitting edema Skin: Dry, Warm Neuro: Intermittently unresponsive, somnolent  Assessment/Plan:  Active Problems:   COPD (chronic obstructive pulmonary disease) (HCC)   OSA (obstructive sleep apnea)   Diabetes mellitus type 2 in obese (HCC)   Acute diastolic CHF (congestive heart failure) (HCC)   Acute on chronic respiratory failure with hypoxia and hypercapnia (HCC)   Acute hypoxemic respiratory failure (HCC)  [name] is a 74 y.o. with PMH of OSA on CPAP, OHS, COPD, T2DM, PAF, NICM, Bilateral intraventricular hemorrhage 2 months prior admit for acute on chronic respiratory failure  Acute on chronic hypoxic hypercarbic resp failure COPD OSA OHS Has hx of OHS, COPD, OSA. Requires positive pressure at night. Discharged on CPAP at nighttime but unable to adequately provide respiratory support. Admit for worsening resp failure. On prior admissions, required bipap overnight. Notified [facility] will be unable to take  [name] back if he requires Bipap. Appreciate social work team's support in finding alternative SNF for [name] - Bipap qhs - Wean off oxygen during day time if possible  Chronic diastolic heart failure On torsemide 40mg  BID. On exam, appear euvolemic. Renal fx stable. - Resume home torsemide 40mg  BID - Daily weight, I&Os  CKD stage 3b Current renal fx at baseline. BUN 50, Creatinine 1.88. Consistent w/ prior renal fx - Avoid nephrotoxic meds when able - Trend bmp  Bilateral Intraventricular Hemorrhage Requires total care. Unable to function. S/p PEG - C/w tube feeds  Subclinical Hypothyroidism TSH noted to be elevated at 6.213. T4 noted to be wnl at 1. Consistent w/ subclinical hypothyroidism. Unable to describe any thyroid related symptoms. - Monitor  DVT prophx: lovenox Diet: tube feeds Bowel: Miralax Code: Full  Prior to Admission Living Arrangement: SNF Anticipated Discharge Location: SNF Barriers to Discharge: Placement Dispo: Anticipated discharge in approximately 2-3 day(s).   Mosetta Anis, MD 03/29/2020, 3:30 PM Pager: [phone] After 5pm on weekdays and 1pm on weekends: On Call Pager: [phone]

## 2020-03-29 NOTE — Progress Notes (Signed)
RT placed pt on BIPAP for the night. Pt settings 16/6 BUR 16 40%. Pt respiratory status is stable at this time w/no distress noted at this time. RT will continue to monitor.

## 2020-03-30 LAB — GLUCOSE, CAPILLARY
Glucose-Capillary: 101 mg/dL — ABNORMAL HIGH (ref 70–99)
Glucose-Capillary: 130 mg/dL — ABNORMAL HIGH (ref 70–99)
Glucose-Capillary: 138 mg/dL — ABNORMAL HIGH (ref 70–99)
Glucose-Capillary: 91 mg/dL (ref 70–99)
Glucose-Capillary: 96 mg/dL (ref 70–99)
Glucose-Capillary: 97 mg/dL (ref 70–99)

## 2020-03-30 LAB — COMPREHENSIVE METABOLIC PANEL
ALT: 9 U/L (ref 0–44)
AST: 14 U/L — ABNORMAL LOW (ref 15–41)
Albumin: 2.7 g/dL — ABNORMAL LOW (ref 3.5–5.0)
Alkaline Phosphatase: 55 U/L (ref 38–126)
Anion gap: 9 (ref 5–15)
BUN: 50 mg/dL — ABNORMAL HIGH (ref 8–23)
CO2: 32 mmol/L (ref 22–32)
Calcium: 9.8 mg/dL (ref 8.9–10.3)
Chloride: 103 mmol/L (ref 98–111)
Creatinine, Ser: 1.91 mg/dL — ABNORMAL HIGH (ref 0.61–1.24)
GFR calc Af Amer: 40 mL/min — ABNORMAL LOW (ref >60)
GFR calc non Af Amer: 34 mL/min — ABNORMAL LOW (ref >60)
Glucose, Bld: 110 mg/dL — ABNORMAL HIGH (ref 70–99)
Potassium: 4 mmol/L (ref 3.5–5.1)
Sodium: 144 mmol/L (ref 135–145)
Total Bilirubin: 0.2 mg/dL — ABNORMAL LOW (ref 0.3–1.2)
Total Protein: 7.3 g/dL (ref 6.5–8.1)

## 2020-03-30 LAB — MAGNESIUM: Magnesium: 2.3 mg/dL (ref 1.7–2.4)

## 2020-03-30 LAB — CBC
HCT: 35.4 % — ABNORMAL LOW (ref 39.0–52.0)
Hemoglobin: 10 g/dL — ABNORMAL LOW (ref 13.0–17.0)
MCH: 24.3 pg — ABNORMAL LOW (ref 26.0–34.0)
MCHC: 28.2 g/dL — ABNORMAL LOW (ref 30.0–36.0)
MCV: 85.9 fL (ref 80.0–100.0)
Platelets: 145 10*3/uL — ABNORMAL LOW (ref 150–400)
RBC: 4.12 MIL/uL — ABNORMAL LOW (ref 4.22–5.81)
RDW: 17.9 % — ABNORMAL HIGH (ref 11.5–15.5)
WBC: 8.8 10*3/uL (ref 4.0–10.5)
nRBC: 0 % (ref 0.0–0.2)

## 2020-03-30 LAB — T3: T3, Total: 71 ng/dL (ref 71–180)

## 2020-03-30 LAB — PHOSPHORUS: Phosphorus: 3.2 mg/dL (ref 2.5–4.6)

## 2020-03-30 NOTE — Progress Notes (Signed)
Subjective:  Interviewed patient at bedside.  [name] continues to be non-verbal but is able to shake his head to questions. [name] confirms [name] is doing well. [name] confirms [name] has no pain. [name] was able to move his arm when prompted by nurse for blanket placement.  Objective:  Vital signs in last 24 hours: Vitals:   03/29/20 2348 03/29/20 2359 03/30/20 0300 03/30/20 0506  BP:  (!) 140/59 127/60 116/60  Pulse: 62 62 65   Resp: 18 20 18    Temp:  98.6 F (37 C) 98.4 F (36.9 C)   TempSrc:  Rectal Axillary   SpO2:   100%   Weight:      Height:       Physical Exam Vitals and nursing note reviewed.  Constitutional:      General: [name] is not in acute distress.    Appearance: Normal appearance. [name] is obese. [name] is not ill-appearing or toxic-appearing.  HENT:     Head: Normocephalic and atraumatic.  Cardiovascular:     Rate and Rhythm: Normal rate and regular rhythm.     Pulses: Normal pulses.          Radial pulses are 2+ on the right side and 2+ on the left side.       Dorsalis pedis pulses are 2+ on the right side and 2+ on the left side.     Heart sounds: Normal heart sounds, S1 normal and S2 normal. No murmur heard.   Pulmonary:     Effort: Pulmonary effort is normal. No respiratory distress.     Breath sounds: Normal breath sounds.  Abdominal:     General: There is distension (due to habitus).     Tenderness: There is no abdominal tenderness.     Comments: PEG tube in place  Musculoskeletal:     Right lower leg: No edema.     Left lower leg: No edema.  Neurological:     Mental Status: Mental status is at baseline.      Assessment/Plan:  Active Problems:   COPD (chronic obstructive pulmonary disease) (HCC)   OSA (obstructive sleep apnea)   Diabetes mellitus type 2 in obese (HCC)   Acute diastolic CHF (congestive heart failure) (HCC)   Acute on chronic respiratory failure with hypoxia and hypercapnia (HCC)   Acute hypoxemic respiratory failure (HCC)   [name] is a  74 y.o. with PMH of OSA on CPAP, OHS, COPD, T2DM, PAF, NICM, Bilateral intraventricular hemorrhage 2 months prior admit for acute on chronic respiratory failure.   Acute on chronic hypoxic hypercarbic resp failure COPD/OSA/OHS: Has hx of OHS, COPD, OSA. Requires positive pressure at night. Discharged on CPAP at nighttime but unable to adequately provide respiratory support. Admitted for worsening respiratory failure. On prior admissions, required bipap overnight. Notified [name] will be unable to take [name] back if [name] requires Bipap. Nurse reports yesterday [name] was able to be on room air the whole day. Will attempt to restart on CPAP tonight. -Attempt CPAP tonight -Wean off oxygen during day time if possible   Chronic diastolic heart failure On torsemide 40mg  BID. On exam, appear euvolemic. Renal function is stable. -Continue home torsemide 40mg  BID -Daily weight, I&Os   CKD stage 3b Current renal function at baseline. BUN 50, Creatinine 1.88.  -Avoid nephrotoxic meds when able -Trend bmp   Bilateral Intraventricular Hemorrhage Requires total care. Unable to function. S/p PEG - Continue with tube feeds   Subclinical Hypothyroidism TSH noted to be elevated  at 6.213. T4 noted to be wnl at 1. Consistent w/ subclinical hypothyroidism. Unable to describe any thyroid related symptoms. - Monitor for any symptoms of hypothyroidism   DVT prophx: lovenox Diet: tube feeds Bowel: Miralax Code: Full   Prior to Admission Living Arrangement: SNF Anticipated Discharge Location: SNF Barriers to Discharge: SNF placement vs Wean off BiPAP Dispo: Anticipated discharge in approximately 3-5 day(s).   Ryan Cedar, MD 03/30/2020, 5:47 AM Pager: [phone] After 5pm on weekdays and 1pm on weekends: On Call pager [phone]

## 2020-03-30 NOTE — Plan of Care (Signed)
  Problem: Clinical Measurements: Goal: Ability to maintain clinical measurements within normal limits will improve Outcome: Progressing Goal: Will remain free from infection Outcome: Progressing   

## 2020-03-30 NOTE — Plan of Care (Signed)

## 2020-03-31 LAB — BASIC METABOLIC PANEL
Anion gap: 8 (ref 5–15)
BUN: 55 mg/dL — ABNORMAL HIGH (ref 8–23)
CO2: 32 mmol/L (ref 22–32)
Calcium: 9.5 mg/dL (ref 8.9–10.3)
Chloride: 101 mmol/L (ref 98–111)
Creatinine, Ser: 1.82 mg/dL — ABNORMAL HIGH (ref 0.61–1.24)
GFR calc Af Amer: 42 mL/min — ABNORMAL LOW (ref >60)
GFR calc non Af Amer: 36 mL/min — ABNORMAL LOW (ref >60)
Glucose, Bld: 121 mg/dL — ABNORMAL HIGH (ref 70–99)
Potassium: 3.9 mmol/L (ref 3.5–5.1)
Sodium: 141 mmol/L (ref 135–145)

## 2020-03-31 LAB — BLOOD GAS, ARTERIAL
Acid-Base Excess: 8.3 mmol/L — ABNORMAL HIGH (ref 0.0–2.0)
Bicarbonate: 33 mmol/L — ABNORMAL HIGH (ref 20.0–28.0)
FIO2: 32
O2 Saturation: 96.3 %
Patient temperature: 37
pCO2 arterial: 52.4 mmHg — ABNORMAL HIGH (ref 32.0–48.0)
pH, Arterial: 7.415 (ref 7.350–7.450)
pO2, Arterial: 81.4 mmHg — ABNORMAL LOW (ref 83.0–108.0)

## 2020-03-31 LAB — GLUCOSE, CAPILLARY
Glucose-Capillary: 102 mg/dL — ABNORMAL HIGH (ref 70–99)
Glucose-Capillary: 104 mg/dL — ABNORMAL HIGH (ref 70–99)
Glucose-Capillary: 105 mg/dL — ABNORMAL HIGH (ref 70–99)
Glucose-Capillary: 115 mg/dL — ABNORMAL HIGH (ref 70–99)
Glucose-Capillary: 131 mg/dL — ABNORMAL HIGH (ref 70–99)
Glucose-Capillary: 83 mg/dL (ref 70–99)

## 2020-03-31 LAB — MAGNESIUM: Magnesium: 2.3 mg/dL (ref 1.7–2.4)

## 2020-03-31 LAB — PHOSPHORUS: Phosphorus: 3.6 mg/dL (ref 2.5–4.6)

## 2020-03-31 LAB — SARS CORONAVIRUS 2 (TAT 6-24 HRS): SARS Coronavirus 2: NEGATIVE

## 2020-03-31 NOTE — Plan of Care (Signed)
  Problem: Clinical Measurements: Goal: Ability to maintain clinical measurements within normal limits will improve Outcome: Progressing Goal: Diagnostic test results will improve Outcome: Progressing   

## 2020-03-31 NOTE — TOC Progression Note (Addendum)
Transition of Care [facility]) - Progression Note    Patient Details  Name: Aj Crunkleton MRN: [id number] Date of Birth: 11-22-1945  Transition of Care [facility]) CM/SW [place], [place] Phone Number: 03/31/2020, 11:10 AM  Clinical Narrative:    12:45p CSW spoke with SW [place]. Confirmed patient is able to return to [facility] with continuous BiPAP. CSW updated MD and had covid test ordered.  12:10p CSW spoke with patient's SW [place] regarding patient's discharge. Patient's previous SNF, [facility] is unable to take patient with a BiPAP. Patient has been declined by [facility], CSW spoke with Perrin Smack to determine if patient could be placed considering the BiPAP machine would be provided by [facility], awaiting a callback.  11:00a CSW reached out to [facility] to inquire on accepting BiPAP. Kitty agreed to follow-up with CSW.  [facility], awaiting a callback.   Expected Discharge Plan: Skilled Nursing Facility Barriers to Discharge: Continued Medical Work up  Expected Discharge Plan and Services Expected Discharge Plan: [place] arrangements for the past 2 months: [place]                                       Social Determinants of Health (SDOH) Interventions    Readmission Risk Interventions Readmission Risk Prevention Plan 05/08/2019  [place] or Home Care Consult Complete  Social Work Consult for [place] Planning/Counseling Complete  Medication Review Press photographer) Complete  Some recent data might be hidden

## 2020-03-31 NOTE — Progress Notes (Signed)
Placed patient on CPAP for HS use via FFM, 10.0 cm H20 30% FIO2. Tolerating well at this time.

## 2020-03-31 NOTE — Progress Notes (Signed)
Pt was placed on CPAP through V60 w/setting of 10 cmH2O and 40% oxygen. Pt tolerating well, no respiratory distress noted at this time. RT will continue to monitor.

## 2020-03-31 NOTE — Progress Notes (Addendum)
Subjective:  Interviewed patient at bedside.  Able to answer some yes/no questions others nodding/shaking head. He reports no pain, his breathing is okay, and his sleep was so-so due to mask.  Objective:  Vital signs in last 24 hours: Vitals:   03/30/20 2005 03/30/20 2250 03/30/20 2336 03/31/20 0348  BP: (!) 120/56  116/60 124/63  Pulse: 61 68 (!) 59 (!) 56  Resp:  (!) 23    Temp: 98.5 F (36.9 C)  (!) 97.5 F (36.4 C) 97.7 F (36.5 C)  TempSrc: Oral  Oral Oral  SpO2: 94% 100% 100% 100%  Weight:      Height:       Physical Exam Vitals and nursing note reviewed.  Constitutional:      General: He is not in acute distress.    Appearance: Normal appearance. He is not ill-appearing or toxic-appearing.  HENT:     Head: Normocephalic and atraumatic.  Cardiovascular:     Rate and Rhythm: Normal rate and regular rhythm.     Pulses: Normal pulses.          Radial pulses are 2+ on the right side and 2+ on the left side.       Dorsalis pedis pulses are 2+ on the right side and 2+ on the left side.     Heart sounds: Normal heart sounds, S1 normal and S2 normal. No murmur heard.   Pulmonary:     Effort: Pulmonary effort is normal. No respiratory distress.     Breath sounds: No wheezing.  Musculoskeletal:     Right lower leg: No edema.     Left lower leg: No edema.      Assessment/Plan:  Active Problems:   COPD (chronic obstructive pulmonary disease) (HCC)   OSA (obstructive sleep apnea)   Diabetes mellitus type 2 in obese (HCC)   Acute diastolic CHF (congestive heart failure) (HCC)   Acute on chronic respiratory failure with hypoxia and hypercapnia (HCC)   Acute hypoxemic respiratory failure (HCC)   [name] is a 74 y.o. with PMH of OSA on CPAP, OHS, COPD, T2DM, PAF, NICM, Bilateral intraventricular hemorrhage 2 months prior admit for acute on chronic respiratory failure.     Acute on chronic hypoxic hypercarbic resp failure COPD/OSA/OHS: Has hx of OHS,  COPD, OSA. Requires positive pressure at night. Discharged on CPAP at nighttime but unable to adequately provide respiratory support. Admitted for worsening respiratory failure. On prior admissions, required bipap overnight. Notified [facility] will be unable to take [name] back if he requires Bipap. Nurse reports yesterday he was able to be on room air the whole day. Attempted CPAP last night and seemed to tolerate it well. Will get ABG to measure response. Will get serial ABG every 2-3 days to trend response to CPAP. -Wean off oxygen during day time if possible     Chronic diastolic heart failure On torsemide 40mg  BID. On exam, appear euvolemic. Renal function is stable. -Continue home torsemide 40mg  BID -Daily weight, I&Os     CKD stage 3b Current renal function at baseline. BUN 55, Creatinine 1.82.  -Avoid nephrotoxic meds when able -Continue to trend bmp     Bilateral Intraventricular Hemorrhage Requires total care. Unable to function independently. S/p PEG tube - Continue with tube feeds     Subclinical Hypothyroidism TSH noted to be elevated at 6.213. T4 noted to be wnl at 1. Consistent w/ subclinical hypothyroidism. Unable to describe any thyroid related symptoms. - Monitor for any symptoms  of hypothyroidism     DVT prophx: lovenox Diet: tube feeds Bowel: Miralax Code: Full   Prior to Admission Living Arrangement: SNF Anticipated Discharge Location: SNF Barriers to Discharge: BiPAP vs CPAP Dispo: Anticipated discharge in approximately 2-5 day(s).   Ryan Cedar, MD 03/31/2020, 5:40 AM Pager: [phone] After 5pm on weekdays and 1pm on weekends: On Call pager [phone]

## 2020-03-31 NOTE — Discharge Summary (Addendum)
Name: [name] MRN: [id number] DOB: 08-Dec-1945 74 y.o. PCP: [name], MD  Date of Admission: 03/27/2020  6:26 PM Date of Discharge: 04/02/2020 Attending Physician: Aldine Contes, MD  Discharge Diagnosis: 1. Acute on chronic, hypoxic, hypercarbic respiratory failure 2. Mild CHF Exacerbation  Discharge Medications: Allergies as of 04/02/2020   No Known Allergies     Medication List    TAKE these medications   acetaminophen 650 MG CR tablet Commonly known as: TYLENOL 1,300 mg See admin instructions. 1,300 mg per tube two times a day   amLODipine 10 MG tablet Commonly known as: NORVASC Place 1 tablet (10 mg total) into feeding tube daily.   aspirin 81 MG chewable tablet Place 81 mg into feeding tube daily.   aspirin EC 81 MG tablet Take 1 tablet (81 mg total) by mouth daily.   carvedilol 12.5 MG tablet Commonly known as: COREG Place 1 tablet (12.5 mg total) into feeding tube 2 (two) times daily with a meal.   docusate 50 MG/5ML liquid Commonly known as: COLACE Place 10 mLs (100 mg total) into feeding tube 2 (two) times daily.   erythromycin ophthalmic ointment Place a 1/2 inch ribbon of ointment into the lower eyelid every 4 hours for 7 days.   famotidine 20 MG tablet Commonly known as: PEPCID Place 1 tablet (20 mg total) into feeding tube daily.   feeding supplement (GLUCERNA 1.2 CAL) Liqd Place 1,000 mLs into feeding tube continuous. What changed: how much to take   feeding supplement (PROSource TF) liquid Place 45 mLs into feeding tube 3 (three) times daily. What changed: Another medication with the same name was changed. Make sure you understand how and when to take each.   free water Soln Place 400 mLs into feeding tube every 4 (four) hours. What changed: how much to take   hydrALAZINE 50 MG tablet Commonly known as: APRESOLINE Place 1 tablet (50 mg total) into feeding tube every 6 (six) hours. What changed: when to take this     NovoLOG FlexPen 100 UNIT/ML FlexPen Generic drug: insulin aspart Inject 0-12 Units into the skin See admin instructions. Inject 6 units into the skin every six hours and, per sliding scale twice a day, if BGL <200 = give nothing; 201-300 = 4 extra units; 301-400 = 8 extra units; >400 = 12 units and call MD   insulin aspart 100 UNIT/ML injection Commonly known as: novoLOG Inject 6 Units into the skin every 4 (four) hours.   insulin aspart 100 UNIT/ML injection Commonly known as: novoLOG Inject 0-20 Units into the skin every 4 (four) hours.   insulin detemir 100 UNIT/ML injection Commonly known as: LEVEMIR Inject 0.4 mLs (40 Units total) into the skin 2 (two) times daily. What changed: how much to take   ipratropium-albuterol 0.5-2.5 (3) MG/3ML Soln Commonly known as: DUONEB Take 3 mLs by nebulization every 6 (six) hours as needed (for wheezing or shortness of breath).   OXYGEN Inhale 2-4 L/min into the lungs See admin instructions. 2 L/min continuously and increase to 4 L/min as needed for shortness of breath   polyethylene glycol 17 g packet Commonly known as: MIRALAX / GLYCOLAX 17 g by Per NG tube route daily as needed for mild constipation or moderate constipation. What changed:   how to take this  when to take this  additional instructions   polyethylene glycol 17 g packet Commonly known as: MIRALAX / GLYCOLAX Place 17 g into feeding tube daily. What changed: Another medication  with the same name was changed. Make sure you understand how and when to take each.   Pro-Stat Liqd Place 30 mLs into feeding tube See admin instructions. Mix 30 ml's of Pro-Stat with 120 ml's of water and give, per PEG-Tube, 2 times a day   torsemide 20 MG tablet Commonly known as: DEMADEX Place 2 tablets (40 mg total) into feeding tube daily. What changed: when to take this   Uloric 80 MG Tabs Generic drug: Febuxostat Place 80 mg into feeding tube at bedtime.            Discharge  Care Instructions  (From admission, onward)         Start     Ordered   04/02/20 0000  Discharge wound care:       Comments: Keep dressing around PEG tube clean, dry, and intact changing as needed.   04/02/20 1006          Disposition and follow-up:   [name] was discharged from [facility] in Stable condition.  At the hospital follow up visit please address:  1.  Acute on chronic, hypoxic, hypercarbic respiratory failure: Has improved after resolving CHF exacerbation. Ensure he is continuing to do well and get an ABG with in a week of discharge to measure his continued improvement. Resume use of CPAP at previous settings.  Mild CHF Exacerbation: Resolved after IV lasix for 2 hospital days and restarting home torsemide dose. Free water amount decreased to 200cc q4 hr from 400cc q 4 hr.   2.  Labs / imaging needed at time of follow-up: BMP, ABG within the next week  3.  Pending labs/ test needing follow-up: None  Follow-up Appointments:   Hospital Course by problem list: 1. Acute on chronic, hypoxic, hypercarbic respiratory failure: On admission VBG showed pH 7.355, pCO2 69.3, pO2 52, bicarb 38.7. Most likely due to multifactorial reasons- OSA and mild CHF exacerbation. He was started on BiPAP and and given lasix and he showed clinical improvement. He was then switched back to his CPAP setting and an ABG was obtained. His ABG had improved to pH 7.415, pCO2 52.4, pO2 81.4, bicarb 33.0. He was then discharged back to his facility on his previous CPAP settings.   2. Mild CHF Exacerbation: On admission he was found to have a BNP of 203.6 and  Troponins of 21.  Was given IV lasix which reduced his excess fluid. He was then restarted on his home dose of Torsemide.   Discharge Vitals:   BP (!) 129/51 (BP Location: Right Arm)   Pulse 60   Temp 98 F (36.7 C) (Oral)   Resp 18   Ht 5\' 9"  (1.753 m)   Wt 131.3 kg   SpO2 100%   BMI 42.75 kg/m   Pertinent  Labs, Studies, and Procedures:  BMP Latest Ref Rng & Units 04/02/2020 03/31/2020 03/30/2020  Glucose 70 - 99 mg/dL 153(H) 121(H) 110(H)  BUN 8 - 23 mg/dL 62(H) 55(H) 50(H)  Creatinine 0.61 - 1.24 mg/dL 1.75(H) 1.82(H) 1.91(H)  BUN/Creat Ratio 10 - 24 - - -  Sodium 135 - 145 mmol/L 141 141 144  Potassium 3.5 - 5.1 mmol/L 3.8 3.9 4.0  Chloride 98 - 111 mmol/L 101 101 103  CO2 22 - 32 mmol/L 29 32 32  Calcium 8.9 - 10.3 mg/dL 9.5 9.5 9.8   CBC Latest Ref Rng & Units 04/02/2020 03/30/2020 03/29/2020  WBC 4.0 - 10.5 K/uL 11.4(H) 8.8 9.4  Hemoglobin 13.0 -  17.0 g/dL 9.7(L) 10.0(L) 9.7(L)  Hematocrit 39 - 52 % 34.5(L) 35.4(L) 34.3(L)  Platelets 150 - 400 K/uL 136(L) 145(L) 130(L)    ABG    Component Value Date/Time   PHART 7.415 04/02/2020 0547   PCO2ART 51.4 (H) 04/02/2020 0547   PO2ART 134 (H) 04/02/2020 0547   HCO3 32.5 (H) 04/02/2020 0547   TCO2 42 (H) 03/27/2020 2042   ACIDBASEDEF 1.1 10/02/2013 0353   O2SAT 99.4 04/02/2020 0547    DG Chest Port 1 View: Findings are concerning for CHF/volume overload with edema though a superimposed infectious process could be present particularly in the regions of more focal consolidation in the right lower lung.   CT Head WO Contrast: Atrophy, chronic microvascular disease. No acute intracranial abnormality   CT Chest WO Contrast: Limited evaluation of the lung parenchyma limited by extensive respiratory motion artifact. Some atelectatic changes are present in the lung bases, right greater than left No focal consolidative opacity to suggest an aspiration pneumonia at this time. Marked central pulmonary vascular congestion and redistribution cephalized vessels as well as some mild fissural thickening. Minimal if any significant interlobular septal thickening. Could reflect some mild interstitial edema/features of CHF. Cardiomegaly with predominantly right heart enlargement. Coronary artery calcifications are present. Please note that the presence of  coronary artery calcium documents the presence of coronary artery disease, the severity of this disease and any potential stenosis cannot be assessed on this non-gated CT examination.   Discharge Instructions: Discharge Instructions    (HEART FAILURE PATIENTS) Call MD:  Anytime you have any of the following symptoms: 1) 3 pound weight gain in 24 hours or 5 pounds in 1 week 2) shortness of breath, with or without a dry hacking cough 3) swelling in the hands, feet or stomach 4) if you have to sleep on extra pillows at night in order to breathe.   Complete by: As directed    Call MD for:  difficulty breathing, headache or visual disturbances   Complete by: As directed    Call MD for:  extreme fatigue   Complete by: As directed    Call MD for:  hives   Complete by: As directed    Call MD for:  persistant dizziness or light-headedness   Complete by: As directed    Call MD for:  persistant nausea and vomiting   Complete by: As directed    Call MD for:  redness, tenderness, or signs of infection (pain, swelling, redness, odor or green/yellow discharge around incision site)   Complete by: As directed    Call MD for:  severe uncontrolled pain   Complete by: As directed    Call MD for:  temperature >100.4   Complete by: As directed    Diet - low sodium heart healthy   Complete by: As directed    Discharge instructions   Complete by: As directed    Dear [name], It was a pleasure taking care of you. You admitted for acute on chronic respiratory failure and mild CHF exacerbation. By giving lasix to remove the excess fluid your CHF exacerbation resolved and your breathing improved.  Thank you very much   Discharge wound care:   Complete by: As directed    Keep dressing around PEG tube clean, dry, and intact changing as needed.   Increase activity slowly   Complete by: As directed       Signed: Briant Cedar, MD 04/02/2020, 10:06 AM   Pager: [phone]

## 2020-04-01 LAB — GLUCOSE, CAPILLARY
Glucose-Capillary: 102 mg/dL — ABNORMAL HIGH (ref 70–99)
Glucose-Capillary: 109 mg/dL — ABNORMAL HIGH (ref 70–99)
Glucose-Capillary: 123 mg/dL — ABNORMAL HIGH (ref 70–99)
Glucose-Capillary: 133 mg/dL — ABNORMAL HIGH (ref 70–99)
Glucose-Capillary: 84 mg/dL (ref 70–99)
Glucose-Capillary: 95 mg/dL (ref 70–99)
Glucose-Capillary: 95 mg/dL (ref 70–99)

## 2020-04-01 LAB — CULTURE, BLOOD (SINGLE)
Culture: NO GROWTH
Special Requests: ADEQUATE

## 2020-04-01 NOTE — Progress Notes (Signed)
   Subjective:  Interviewed patient at bedside. Unable to answer questions in complete sentences but is able to say yes or no or shake his head.  Reports doing alright this AM, slept well last night, and has no pain. Continues to have some breathing problems, but stable. Discussed plan to discharge to SNF.  Objective:  Vital signs in last 24 hours: Vitals:   03/31/20 2004 03/31/20 2309 03/31/20 2344 04/01/20 0426  BP: 120/83  103/82 120/62  Pulse: 62  (!) 57 61  Resp: 18 (!) 24 20 20   Temp: 98.7 F (37.1 C)  98.1 F (36.7 C) 97.7 F (36.5 C)  TempSrc: Oral  Oral Axillary  SpO2: 99%  98% 95%  Weight:      Height:       Physical Exam Vitals and nursing note reviewed.  Constitutional:      General: [name] is not in acute distress.    Appearance: [name] is obese. [name] is not ill-appearing or toxic-appearing.  HENT:     Head: Normocephalic.  Cardiovascular:     Rate and Rhythm: Normal rate and regular rhythm.     Pulses: Normal pulses.     Heart sounds: Normal heart sounds. No murmur heard.   Pulmonary:     Effort: Pulmonary effort is normal. No respiratory distress.     Breath sounds: Normal breath sounds. No wheezing.  Abdominal:     General: There is distension (body habitus).     Comments: PEG tube in place, dressing clean, dry, and intact.      Assessment/Plan:  Active Problems:   COPD (chronic obstructive pulmonary disease) (HCC)   OSA (obstructive sleep apnea)   Diabetes mellitus type 2 in obese (HCC)   Acute diastolic CHF (congestive heart failure) (HCC)   Acute on chronic respiratory failure with hypoxia and hypercapnia ([place])   Acute hypoxemic respiratory failure ([place])   [name] a 73 y.o.with PMH of OSA on CPAP, OHS, COPD, T2DM, PAF, NICM, Bilateral intraventricular hemorrhage 2 months prioradmit for acute on chronic respiratory failure.   Acute on chronic hypoxic hypercarbic resp failure COPD/OSA/OHS: Has hx of OHS, COPD, OSA. Requires positive  pressure at night. ABG measured favorible response to CPAP. Appears main driving force of this acute episode was the CHF exacerbation. -Will stay with current CPAP settings -Wean off oxygen during day time if possible   Chronic diastolic heart failure: On torsemide 40mg  BID. On exam, appear euvolemic. Renal function isstable. -Continuehome torsemide 40mg  BID -Daily weight, I&Os   CKD stage 3b: Current renal functionat baseline.   -Avoid nephrotoxic meds when able -Continue to trend bmp   Bilateral Intraventricular Hemorrhage: Requires total care. Unable to function independently. S/p PEG tube - Continue withtube feeds   Subclinical Hypothyroidism: TSH noted to be elevated at 6.213. T4 noted to be wnl at Consistent w/ subclinical hypothyroidism. Unable to describe any thyroid related symptoms. -Monitorfor any symptoms of hypothyroidism   DVT prophx:lovenox Diet:tube feeds Bowel:Miralax Code:Full   Prior to Admission Living Arrangement: SNF Anticipated Discharge Location: SNF Barriers to Discharge: None Dispo: Anticipated discharge in approximately 1 day(s).   Briant Cedar, MD 04/01/2020, 5:32 AM Pager: [phone] After 5pm on weekdays and 1pm on weekends: On Call pager [phone]

## 2020-04-01 NOTE — Plan of Care (Signed)

## 2020-04-01 NOTE — TOC Progression Note (Signed)
Transition of Care [facility]) - Progression Note    Patient Details  Name: [name] MRN: [id number] Date of Birth: [birth year]  Transition of Care [facility]) CM/SW [place], [place] Phone Number: 04/01/2020, 2:27 PM  Clinical Narrative:     CSW confirmed with MD that will be discharging with CPAP. [name] will [facility] pt on [facility]. [place] stated they will be able to admit pt tomorrow 8/18.   CSW spoke to pts [place] SW, [place]. Doroteo [place] states pt has his own cpap machine.   CSW will continue to follow.   Expected Discharge Plan: [place] Barriers to Discharge: Continued Medical Work up  Expected Discharge Plan and Services Expected Discharge Plan: [place] arrangements for the past 2 months: [place] Determinants of Health (SDOH) Interventions    Readmission Risk Interventions Readmission Risk Prevention Plan 05/08/2019  [place] or Home Care Consult Complete  Social Work Consult for [place] Planning/Counseling Complete  Medication Review Press photographer) Complete  Some recent data might be hidden   Emeterio Reeve, Latanya Presser, [place] Social Worker [phone]

## 2020-04-01 NOTE — Progress Notes (Signed)
Placed patient on CPAP, vi aFFM 10.0cm H20 30% FIO2.

## 2020-04-02 ENCOUNTER — Inpatient Hospital Stay: Payer: Medicare (Managed Care)

## 2020-04-02 DIAGNOSIS — Z23 Encounter for immunization: Secondary | ICD-10-CM

## 2020-04-02 LAB — BASIC METABOLIC PANEL
Anion gap: 11 (ref 5–15)
BUN: 62 mg/dL — ABNORMAL HIGH (ref 8–23)
CO2: 29 mmol/L (ref 22–32)
Calcium: 9.5 mg/dL (ref 8.9–10.3)
Chloride: 101 mmol/L (ref 98–111)
Creatinine, Ser: 1.75 mg/dL — ABNORMAL HIGH (ref 0.61–1.24)
GFR calc Af Amer: 44 mL/min — ABNORMAL LOW (ref >60)
GFR calc non Af Amer: 38 mL/min — ABNORMAL LOW (ref >60)
Glucose, Bld: 153 mg/dL — ABNORMAL HIGH (ref 70–99)
Potassium: 3.8 mmol/L (ref 3.5–5.1)
Sodium: 141 mmol/L (ref 135–145)

## 2020-04-02 LAB — CBC
HCT: 34.5 % — ABNORMAL LOW (ref 39.0–52.0)
Hemoglobin: 9.7 g/dL — ABNORMAL LOW (ref 13.0–17.0)
MCH: 24 pg — ABNORMAL LOW (ref 26.0–34.0)
MCHC: 28.1 g/dL — ABNORMAL LOW (ref 30.0–36.0)
MCV: 85.4 fL (ref 80.0–100.0)
Platelets: 136 10*3/uL — ABNORMAL LOW (ref 150–400)
RBC: 4.04 MIL/uL — ABNORMAL LOW (ref 4.22–5.81)
RDW: 17.6 % — ABNORMAL HIGH (ref 11.5–15.5)
WBC: 11.4 10*3/uL — ABNORMAL HIGH (ref 4.0–10.5)
nRBC: 0 % (ref 0.0–0.2)

## 2020-04-02 LAB — GLUCOSE, CAPILLARY
Glucose-Capillary: 137 mg/dL — ABNORMAL HIGH (ref 70–99)
Glucose-Capillary: 105 mg/dL — ABNORMAL HIGH (ref 70–99)
Glucose-Capillary: 93 mg/dL (ref 70–99)

## 2020-04-02 LAB — BLOOD GAS, ARTERIAL
Acid-Base Excess: 7.7 mmol/L — ABNORMAL HIGH (ref 0.0–2.0)
Bicarbonate: 32.5 mmol/L — ABNORMAL HIGH (ref 20.0–28.0)
Drawn by: 347621
FIO2: 30
O2 Saturation: 99.4 %
Patient temperature: 36.7
pCO2 arterial: 51.4 mmHg — ABNORMAL HIGH (ref 32.0–48.0)
pH, Arterial: 7.415 (ref 7.350–7.450)
pO2, Arterial: 134 mmHg — ABNORMAL HIGH (ref 83.0–108.0)

## 2020-04-02 MED ORDER — FREE WATER: 200.0000 mL | Status: AC

## 2020-04-02 MED ORDER — FREE WATER: 100.0000 mL | Status: DC

## 2020-04-02 NOTE — Progress Notes (Signed)
° °  Subjective:  Interviewed patient at bedside.  Able to answer questions with one to two word answers. He reports doing well, his breathing is ok, and he has no pain.  Objective:  Vital signs in last 24 hours: Vitals:   04/01/20 2330 04/02/20 0000 04/02/20 0420 04/02/20 0439  BP:    (!) 129/51  Pulse: 64 (!) 57 (!) 55 60  Resp: (!) 24 20 (!) 26 20  Temp:    98 F (36.7 C)  TempSrc:    Oral  SpO2: 98% 97% 100% 100%  Weight:      Height:       Physical Exam Vitals and nursing note reviewed.  Constitutional:      General: He is not in acute distress.    Appearance: Normal appearance. He is obese. He is not ill-appearing or toxic-appearing.  HENT:     Head: Normocephalic and atraumatic.  Cardiovascular:     Rate and Rhythm: Normal rate and regular rhythm.     Pulses: Normal pulses.          Radial pulses are 2+ on the right side and 2+ on the left side.       Dorsalis pedis pulses are 2+ on the right side and 2+ on the left side.     Heart sounds: Normal heart sounds, S1 normal and S2 normal. No murmur heard.   Abdominal:     General: There is distension (body habitus).     Palpations: Abdomen is soft. There is no mass.     Comments: PEG tube in place and dressing is clean, dry, and intact   Musculoskeletal:     Right lower leg: No edema.     Left lower leg: No edema.      Assessment/Plan:  Active Problems:   COPD (chronic obstructive pulmonary disease) (HCC)   OSA (obstructive sleep apnea)   Diabetes mellitus type 2 in obese (HCC)   Acute diastolic CHF (congestive heart failure) (HCC)   Acute on chronic respiratory failure with hypoxia and hypercapnia ([place])   Acute hypoxemic respiratory failure ([place])   [name] a 74 y.o.with PMH of OSA on CPAP, OHS, COPD, T2DM, PAF, NICM, Bilateral intraventricular hemorrhage 2 months prioradmit for acute on chronic respiratory failure.   Acute on chronic hypoxic hypercarbic resp failure COPD/OSA/OHS: Has hx  of OHS, COPD, OSA. Requires positive pressure at night. ABG measured favorible response to CPAP. Appears main driving force of this acute episode was the CHF exacerbation. -Will stay with current CPAP settings -Wean off oxygen during day time if possible   Chronic diastolic heart failure: On torsemide 40mg  BID. On exam, appear euvolemic. Renal function isstable. -Continuehome torsemide 40mg  BID -Daily weight, I&Os   CKD stage 3b: Current renal functionat baseline. Creatinine today is 1.75. -Avoid nephrotoxic meds when able -Continue to trend bmp   Bilateral Intraventricular Hemorrhage: Requires total care. Unable to functionindependently. S/p PEGtube - Continue withtube feeds   Subclinical Hypothyroidism: TSH noted to be elevated at 6.213. T4 noted to be wnl at Consistent w/ subclinical hypothyroidism. Unable to describe any thyroid related symptoms. -Monitorfor any symptoms of hypothyroidism   DVT prophx:lovenox Diet:tube feeds Bowel:Miralax Code:Full   Prior to Admission Living Arrangement: SNF Anticipated Discharge Location: SNF Barriers to Discharge: None Dispo: Anticipated discharge today.   Briant Cedar, MD 04/02/2020, 5:43 AM Pager: [phone] After 5pm on weekdays and 1pm on weekends: On Call pager [phone]

## 2020-04-02 NOTE — Progress Notes (Signed)
Placed a call to [facility] 2x but no one is picking up the phone.

## 2020-04-02 NOTE — TOC Transition Note (Signed)
Transition of Care [facility]) - CM/SW Discharge Note   Patient Details  Name: [name] MRN: [id number] Date of Birth: [birth year]  Transition of Care [facility]) CM/SW Contact:  Emeterio Reeve, [place] Phone Number: 04/02/2020, 1:40 PM   Clinical Narrative:    Pt will discharge to [facility] via [place]. Pts daughter Donella Stade was notified, CSW called [name] twice and left message. [facility] has been called.   Nurse can call report to [phone].   Final next level of care: Skilled Nursing Facility Barriers to Discharge: Barriers Resolved   Patient Goals and CMS Choice   CMS Medicare.gov Compare Post Acute Care list provided to:: Patient Choice offered to / list presented to : Patient, Adult Children  Discharge Placement              [facility] bed at[facility] Patient to be transferred to facility by: [place] Name of family member notified: Chrystal, daughter Patient and family notified of of transfer: 04/02/20  Discharge Plan and Services                                     Social Determinants of Health (SDOH) Interventions     Readmission Risk Interventions Readmission Risk Prevention Plan 05/08/2019  [place] or Home Care Consult Complete  Social Work Consult for [place] Planning/Counseling Complete  Medication Review [employer]) Complete  Some recent data might be hidden   Emeterio Reeve, Latanya Presser, [place] Social Worker [phone]

## 2020-04-02 NOTE — Progress Notes (Signed)
   Covid-19 Vaccination Clinic  Name:  Akeen Ledyard    MRN: [id number] DOB: 03-May-1946  04/02/2020  Mr. Joos was observed post Covid-19 immunization for 15 minutes without incident. He was provided with Vaccine Information Sheet and instruction to access the V-Safe system.   Mr. Deskins was instructed to call 911 with any severe reactions post vaccine: [place] Kitchen Difficulty breathing  . Swelling of face and throat  . A fast heartbeat  . A bad rash all over body  . Dizziness and weakness   Immunizations Administered    Name Date Dose VIS Date Route   Pfizer COVID-19 Vaccine 04/02/2020 12:07 PM 0.3 mL 10/10/2018 Intramuscular   Manufacturer: [employer]   Lot: [id number]   [place]: [id number]

## 2020-04-02 NOTE — Progress Notes (Signed)
Unit secretary called daughter [name] who claimed not to send the covid immunization card with pt in the nursing home. She will send someone to come and pick it up.

## 2020-04-02 NOTE — Progress Notes (Signed)
Second dose of covid  Vaccine given by Rn to right arm. Card to be picked up by daughter cherie.

## 2020-04-02 NOTE — Progress Notes (Signed)
Peg tube dressing changed 3x , pt. Kept pulling

## 2020-04-02 NOTE — Progress Notes (Signed)
Picked up by [facility]  For transport  to [facility]. Report given to ambulance staff. Pt has no belongings.

## 2020-04-02 NOTE — Progress Notes (Signed)
Attempted to pull out peg tube and o2. bil mittens applied.

## 2020-04-15 ENCOUNTER — Telehealth: Payer: Self-pay | Admitting: Physician Assistant

## 2020-04-15 ENCOUNTER — Other Ambulatory Visit: Payer: Self-pay | Admitting: Physician Assistant

## 2020-04-15 DIAGNOSIS — Z952 Presence of prosthetic heart valve: Secondary | ICD-10-CM

## 2020-04-15 NOTE — Telephone Encounter (Signed)
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

## 2020-04-15 NOTE — Telephone Encounter (Signed)
Thanks for letting me know!

## 2020-04-15 NOTE — Telephone Encounter (Signed)
  [facility]   MULTIDISCIPLINARY HEART VALVE TEAM  Called patient's daughter to get echo set up and she let me know he passed peacefully over the weekend. Will mark chart as deceased and let his care team know.   Angelena Form PA-C  [facility]

## 2020-04-17 ENCOUNTER — Other Ambulatory Visit (HOSPITAL_COMMUNITY): Payer: Medicare (Managed Care)

## 2020-04-17 ENCOUNTER — Encounter (HOSPITAL_COMMUNITY): Payer: Medicare (Managed Care)

## 2020-05-05 ENCOUNTER — Encounter: Payer: Medicare (Managed Care) | Admitting: Cardiovascular Disease

## 2020-05-19 ENCOUNTER — Other Ambulatory Visit: Payer: Medicare (Managed Care)

## 2020-05-26 ENCOUNTER — Ambulatory Visit: Payer: Medicare (Managed Care) | Admitting: Hematology

## 2020-12-02 IMAGING — DX DG CHEST 2V
3 series · 3 of 3 positions shown · non-contrast
Comparison: October 14, 2017

CLINICAL DATA: Cough and shortness of breath

EXAM:
CHEST - 2 VIEW

[dg chest 2 view (1 of 3)]
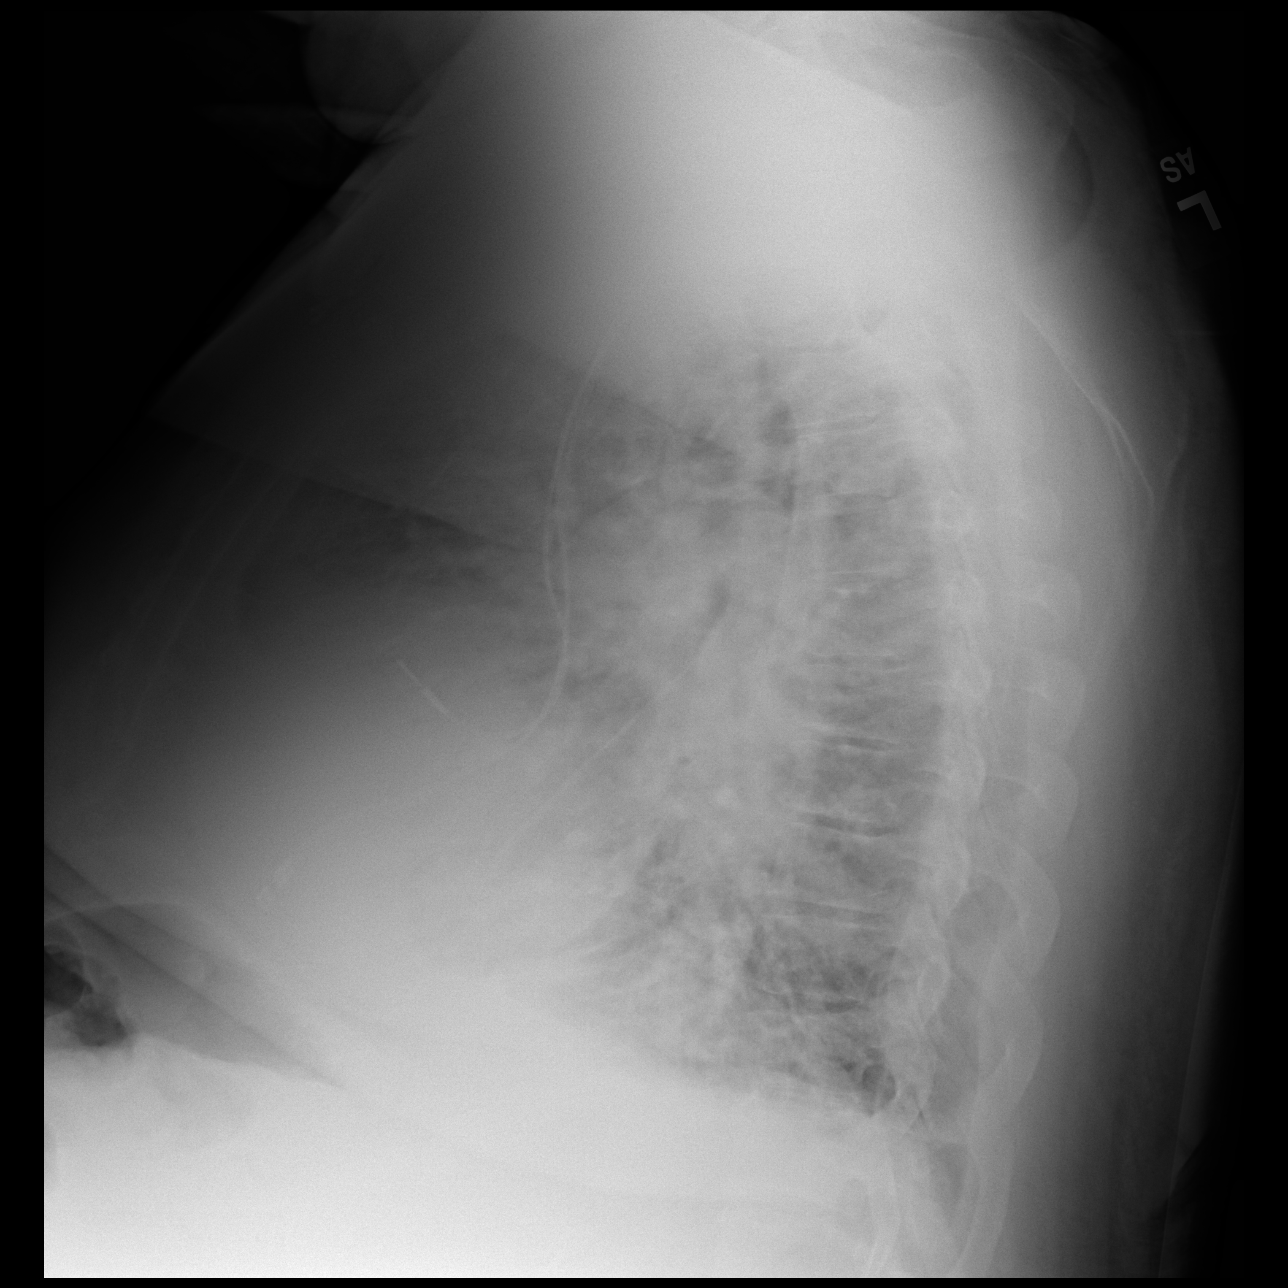

[dg chest 2 view (2 of 3)]
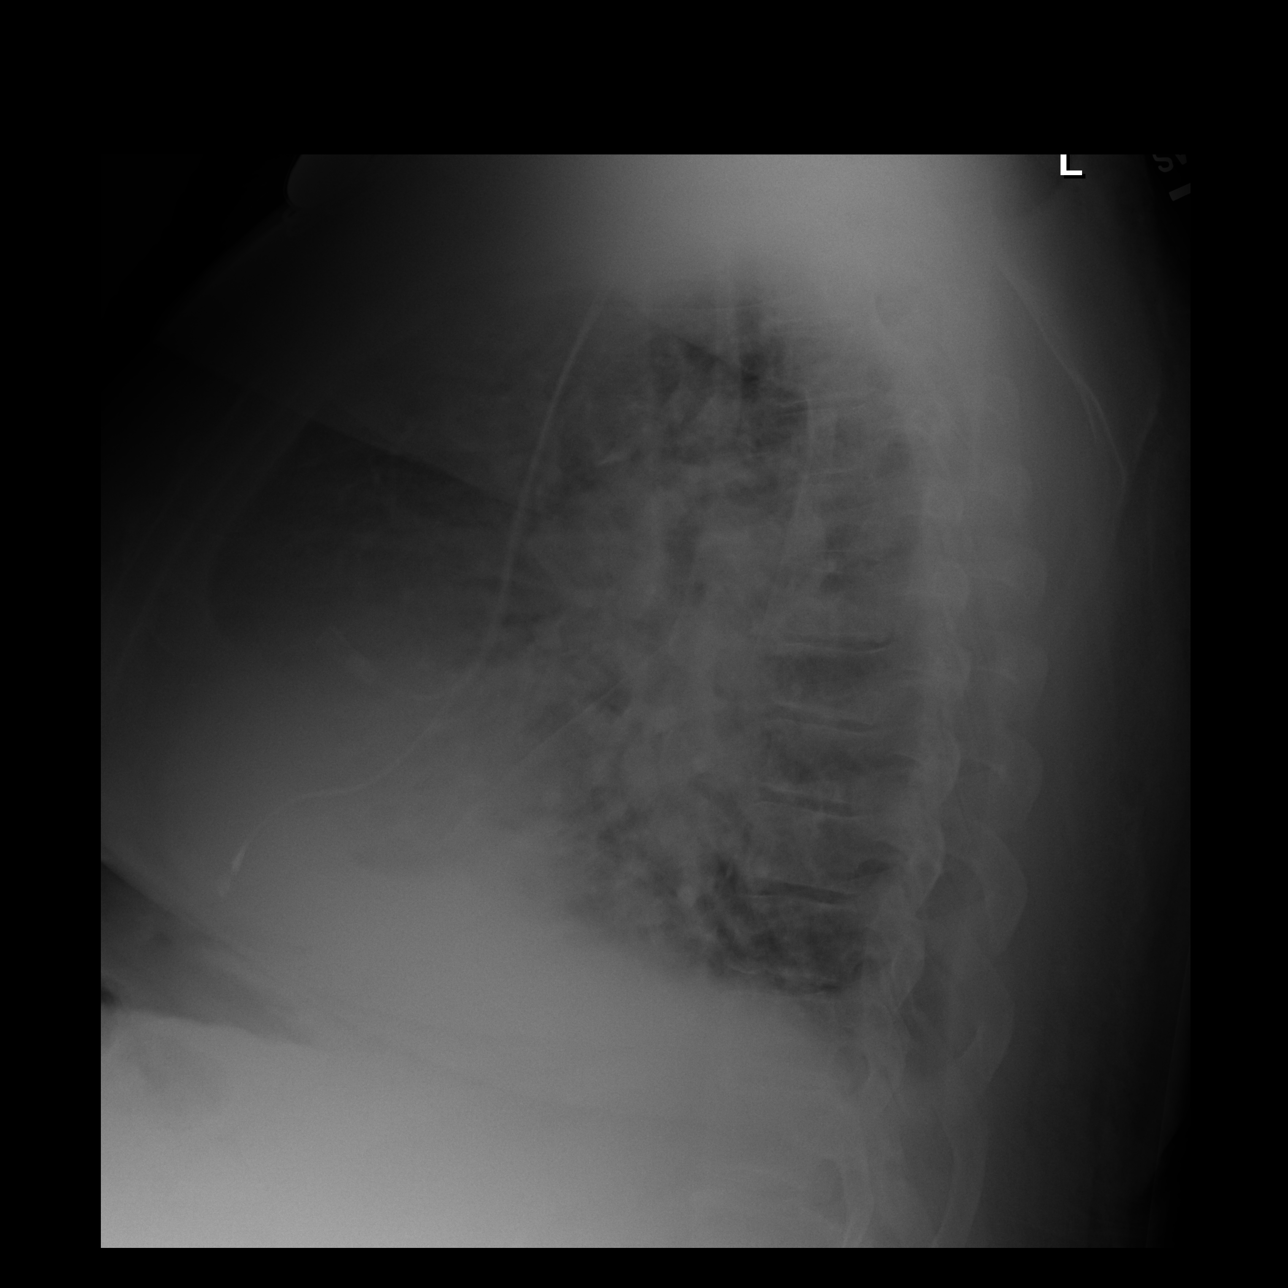

[dg chest 2 view (3 of 3)]
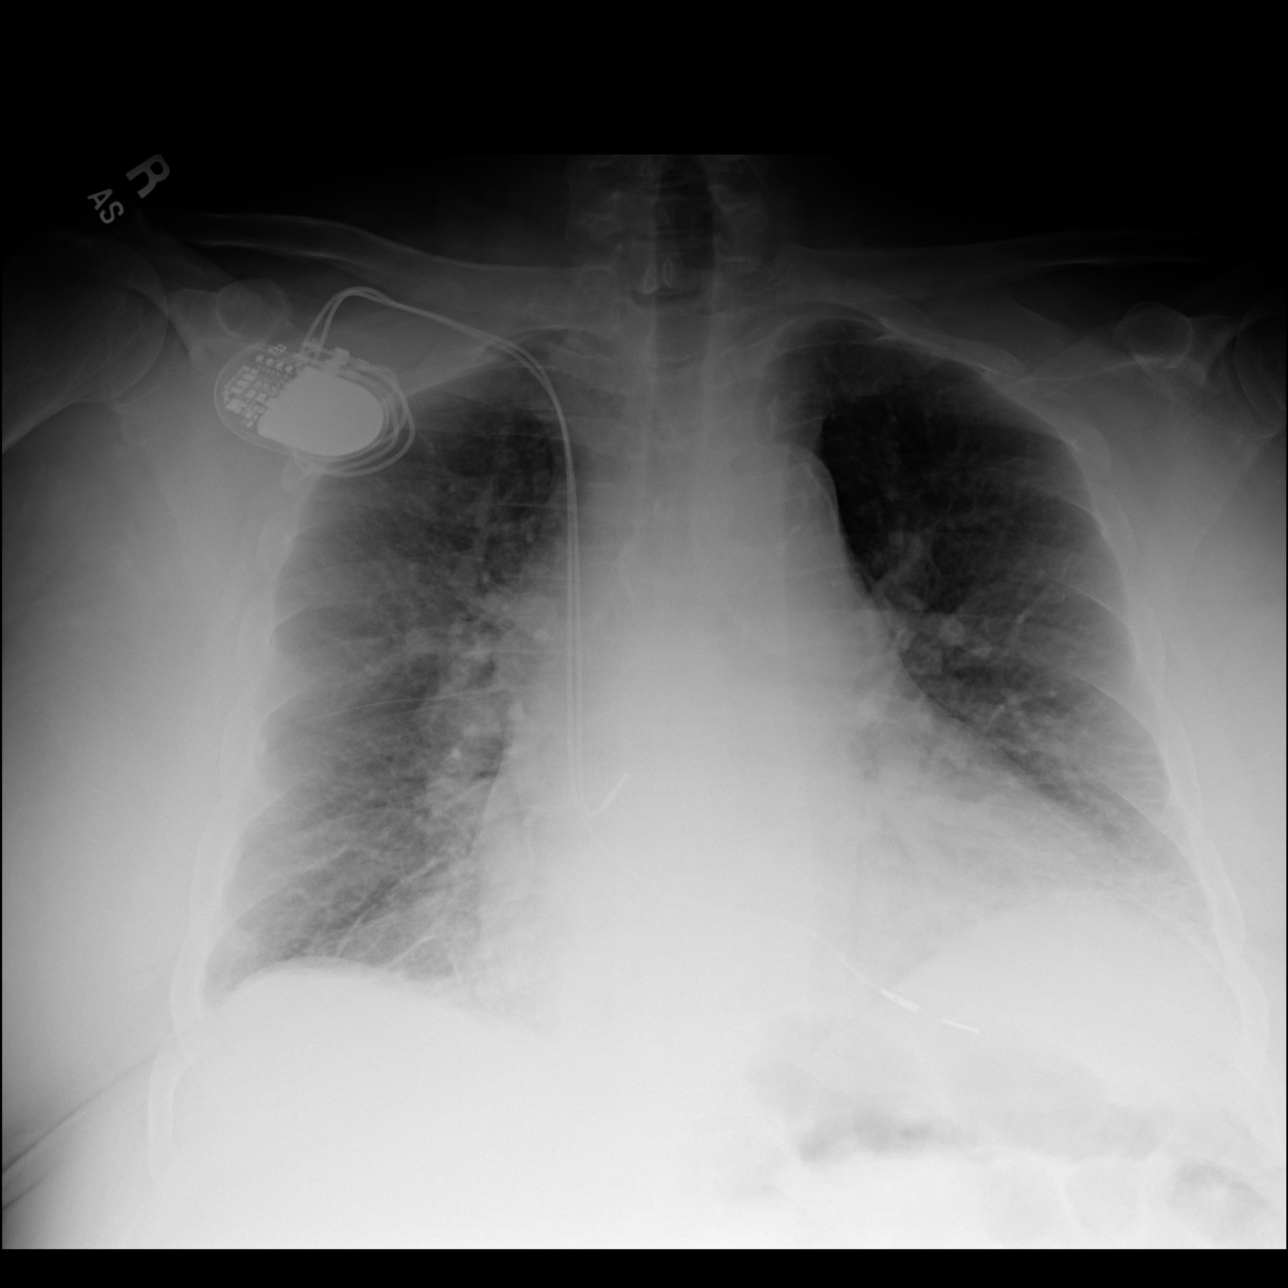

[3 of 3 positions shown; findings below may reference images not displayed]

FINDINGS: There is atelectatic change in the lung bases. There may be early
consolidation in the left base.

There is cardiomegaly with pulmonary venous hypertension. Pacemaker
leads are attached to the right atrium and right ventricle. There is
aortic atherosclerosis. No bone lesions. No evident adenopathy.
IMPRESSION: Pulmonary vascular congestion. Bibasilar atelectasis with
questionable early pneumonia left base. Pacemaker leads attached to
right atrium and right ventricle. There is aortic atherosclerosis.

Aortic Atherosclerosis ([id number]).

These results will be called to the ordering clinician or
representative by the Radiologist Assistant, and communication
documented in the PACS or zVision Dashboard.

## 2020-12-12 IMAGING — DX DG CHEST 2V
3 series · 3 of 3 positions shown · non-contrast
Comparison: 08/21/2018.

CLINICAL DATA: Severe shortness of breath

EXAM:
CHEST - 2 VIEW

[x chest ap (1 of 2)]
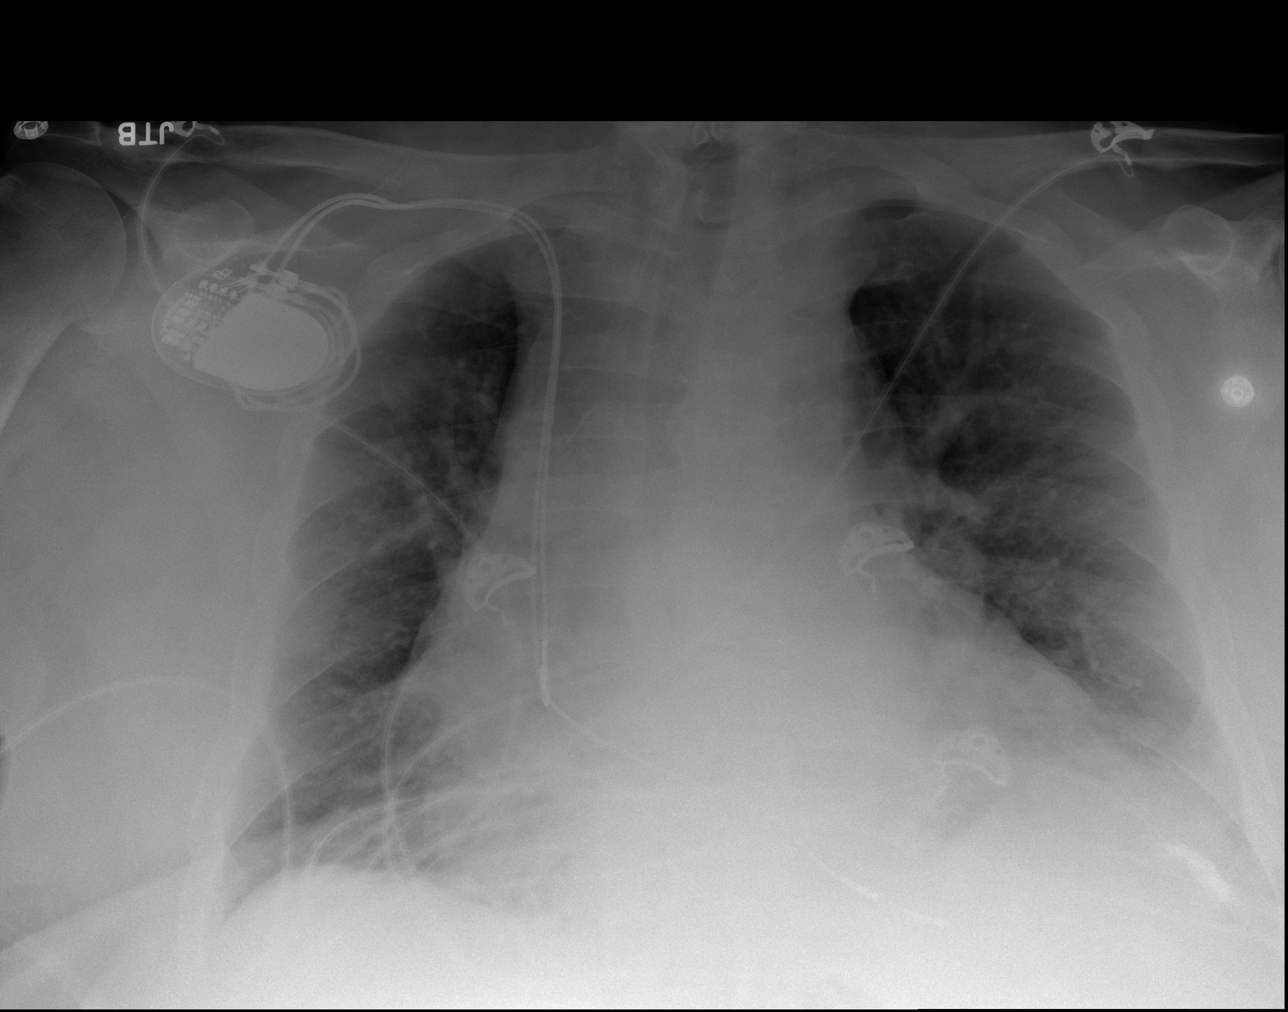

[x chest ap (2 of 2)]
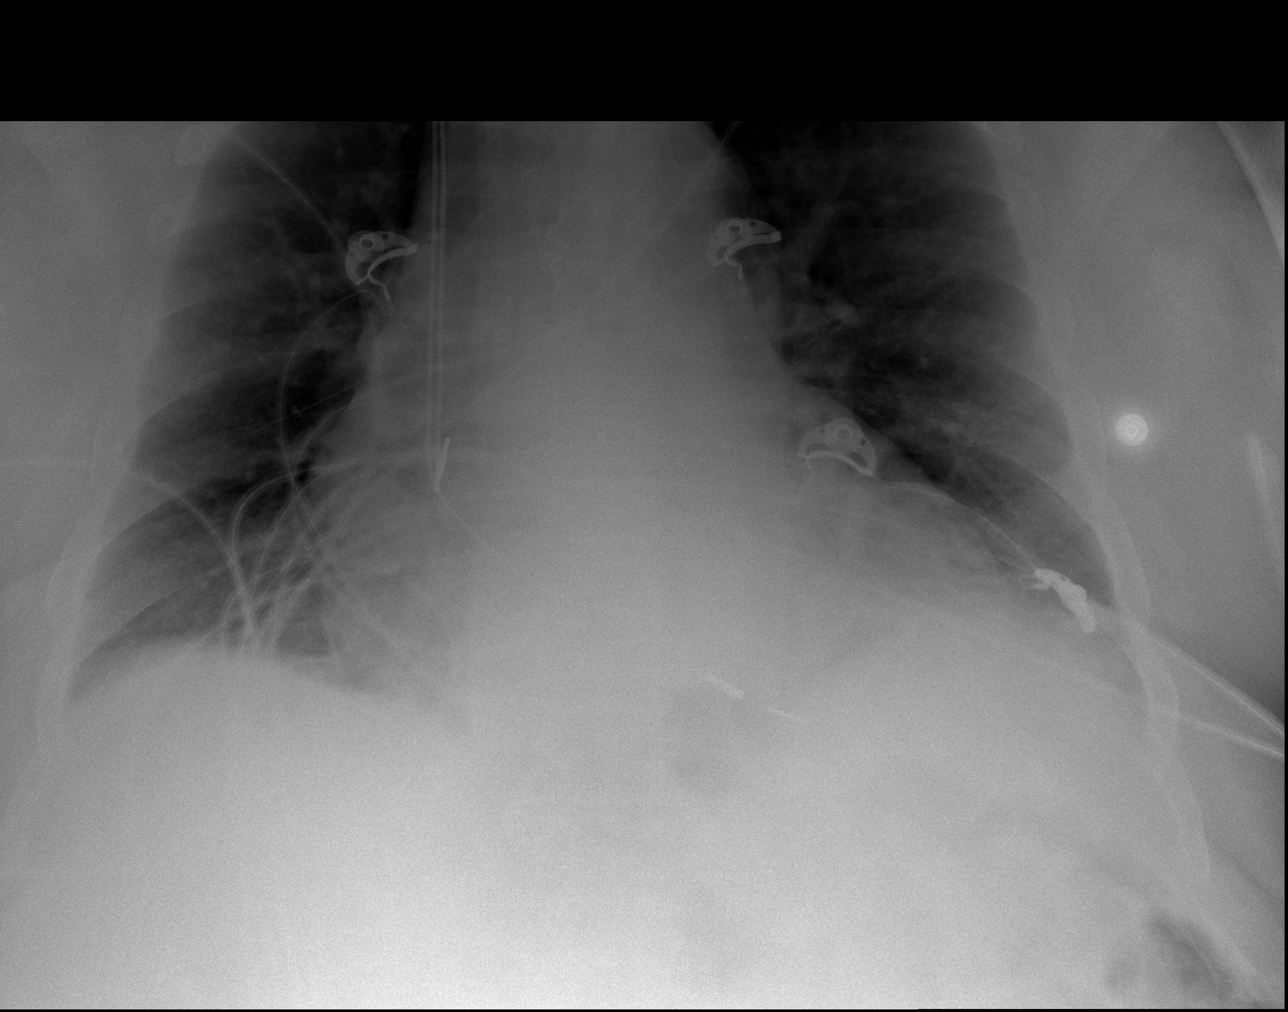

[w chest lat]
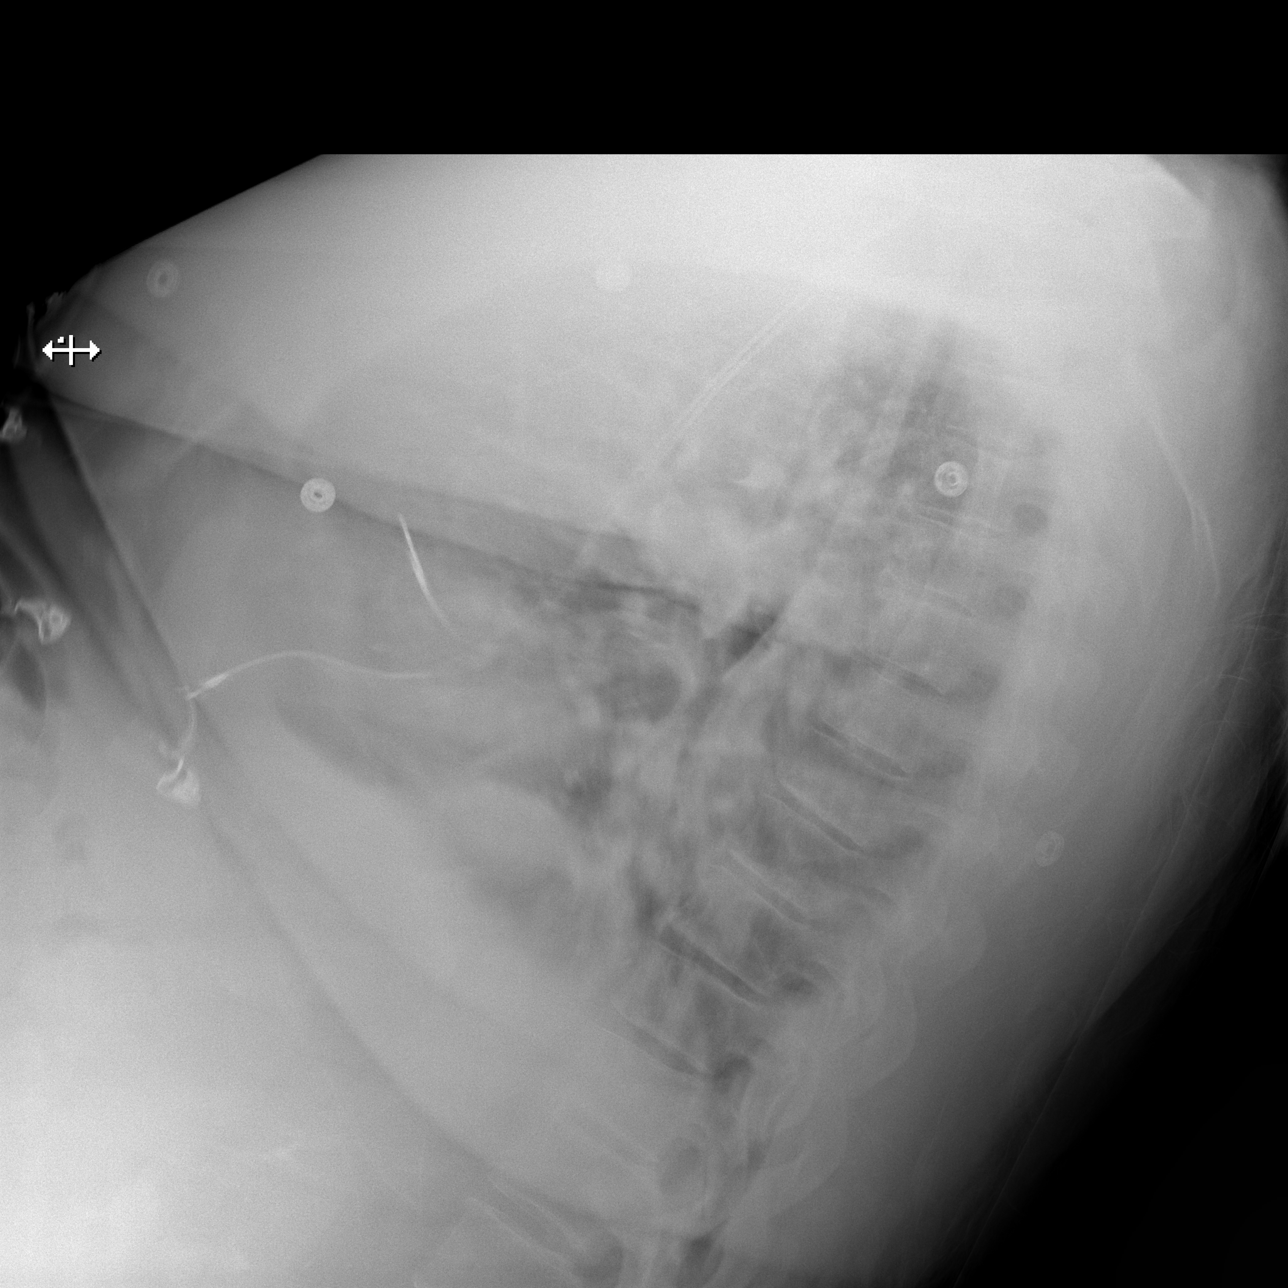

[3 of 3 positions shown; findings below may reference images not displayed]

FINDINGS: Cardiac pacer with lead tips over the right atrium right ventricle.
Stable cardiomegaly with improving pulmonary venous congestion.
Previously identified pulmonary interstitial prominence has cleared.
Mild basilar atelectasis. No pleural effusion. No pneumothorax. No
acute bony abnormality.
IMPRESSION: 1.  Cardiac pacer s in table position.

2. Stable cardiomegaly with mild pulmonary venous congestion.
Pulmonary venous congestion has improved from prior exam. Previously
identified interstitial edema has cleared.

3.  Bibasilar atelectasis.

## 2021-08-05 IMAGING — CT CT HEART MORP W/ CTA COR W/ SCORE W/ CA W/CM &/OR W/O CM
2 of 10 series · 8 of 20 positions shown, 9 images · IV contrast (APPLIED)
Comparison: Chest CT 06/20/2017.
COMPARISON: Chest CT 06/20/2017.

Addendum:
EXAM:
OVER-READ INTERPRETATION  CT CHEST

The following report is an over-read performed by radiologist Dr.
Jubelyn Dammert [REDACTED] on 04/24/2019. This
over-read does not include interpretation of cardiac or coronary
anatomy or pathology. The coronary calcium score/coronary CTA
interpretation by the cardiologist is attached.
CLINICAL DATA: Aortic stenosis
Cardiac TAVR CT
TECHNIQUE: The patient was scanned on a Siemens Force [REDACTED]ice scanner. A 120
kV retrospective scan was triggered in the descending thoracic aorta
at 111 HU's. Gantry rotation speed was 270 msecs and collimation was
.9 mm. No beta blockade or nitro were given. The 3D data set was
reconstructed in 5% intervals of the R-R cycle. Systolic and
diastolic phases were analyzed on a dedicated work station using
MPR, MIP and VRT modes. The patient received 80 cc of contrast.

[Series 8: 0-90% · axial · 0.39mm/px · z∈[+1120,+1259]mm · 4 of 3840 slices shown, 5 images]
[im 768/3840  vessel]
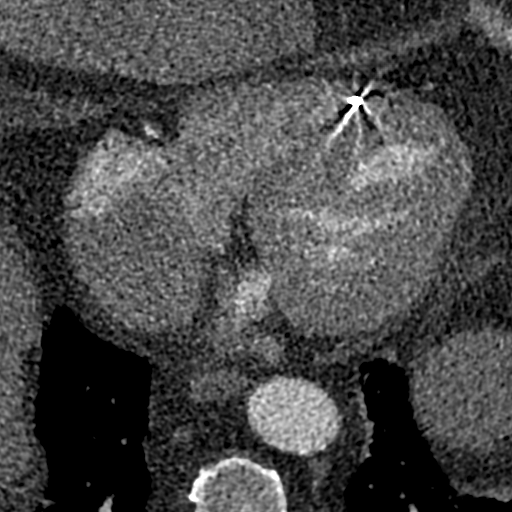
[im 768/3840  lung]
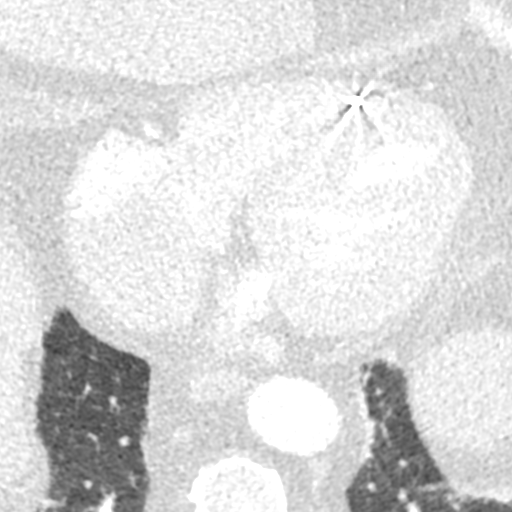
[im 1536/3840  vessel]
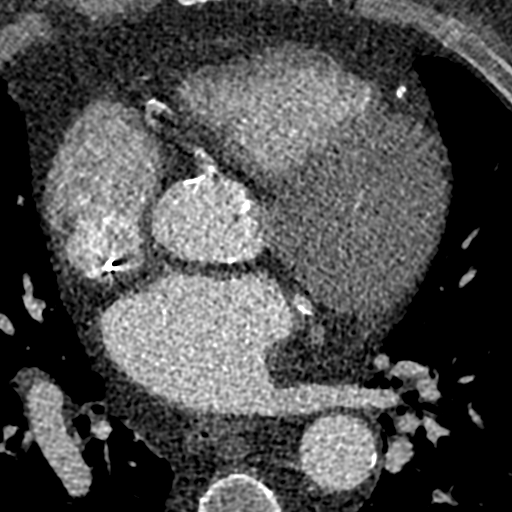
[im 2304/3840  vessel]
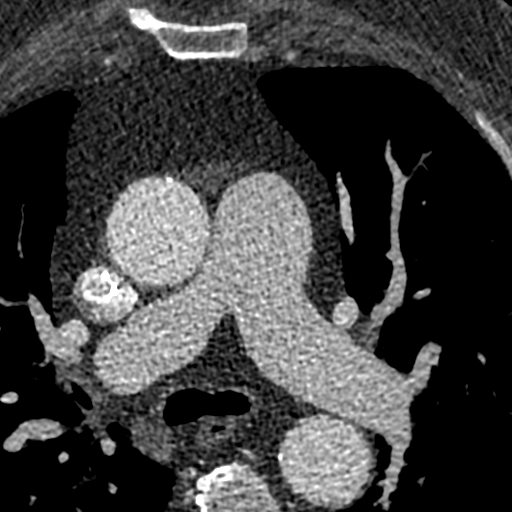
[im 3072/3840  vessel]
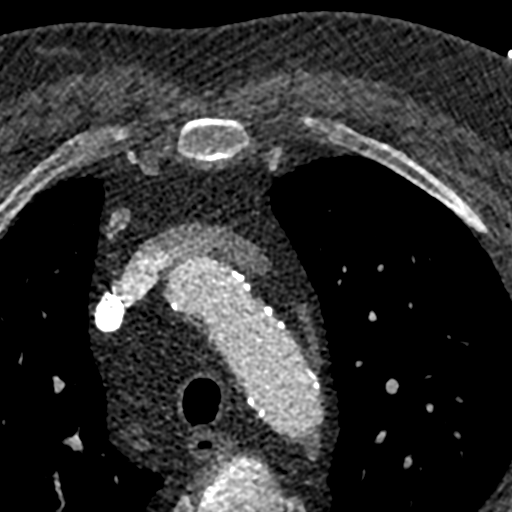

[Series 9: 5-95% · axial · 0.39mm/px · z∈[+1120,+1259]mm · 4 of 3840 slices shown]
[im 768/3840  vessel]
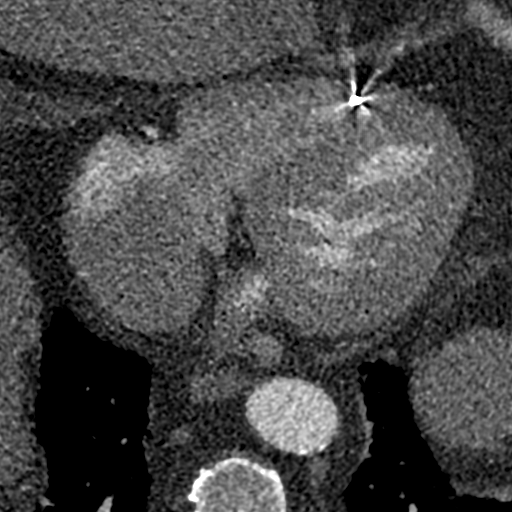
[im 1536/3840  vessel]
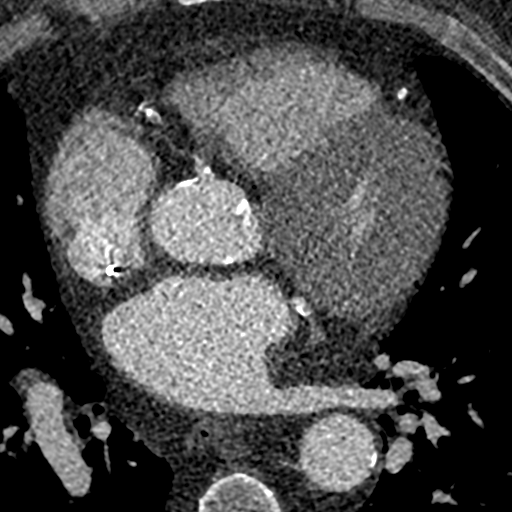
[im 2304/3840  vessel]
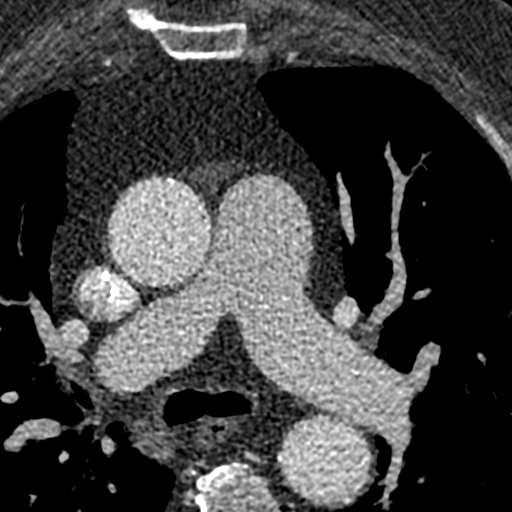
[im 3072/3840  vessel]
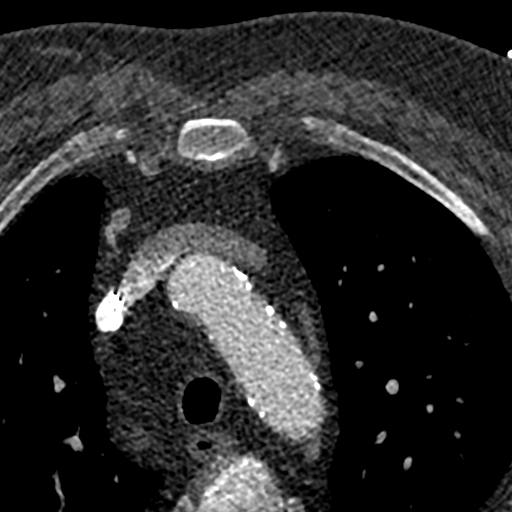

[8 of 20 positions shown; findings below may reference images not displayed]

FINDINGS: Extracardiac findings will be described separately under dictation
for contemporaneously obtained CTA chest, abdomen and pelvis.
IMPRESSION: Please see separate dictation for contemporaneously obtained CTA
chest, abdomen and pelvis 04/24/2019 for full description of
relevant extracardiac findings.
FINDINGS: Aortic Valve: Calcified tri leaflet with restricted leaflet motion

Aorta: Severe calcific atherosclerosis Normal arch vessels mild
aortic root dilatation

Sinotubular Junction: 30 mm

Ascending Thoracic Aorta: 40 mm

Aortic Arch: 31 mm

Descending Thoracic Aorta: 28 mm

Sinus of Valsalva Measurements:

Non-coronary: 33.2 mm

Right - coronary: 30.8 mm

Left - coronary: 34.5 mm

Coronary Artery Height above Annulus:

Left Main: 10.5 mm somewhat shallow

Right Coronary: 14.6 mm above annulus

Virtual Basal Annulus Measurements:

Maximum/Minimum Diameter: 25.2 mm x 28.9 mm

Perimeter: 87 mm

Area: 581 mm2

Coronary Arteries: LM a bit shallow

Optimum Fluoroscopic Angle for Delivery: LAO 17 Caudal 16 degrees
IMPRESSION: 1. Calcified tri leaflet aortic valve with annular area of 581 mm2
suitable for a 29 mm Sapien 3 Ultra valve.

2. Mild aortic root dilatation 4.0 cm with severe calcific
atherosclerosis

3. Coronary arteries sufficient height above annulus for deployment
although LM a bit shallow at 10.5 mm

4.  Pacing wires in RA/RV

5.  No AIKO thrombus

6. Optimum angiographic angle for deployment LAO 17 Caudal 16
degrees

Tai Demers

*** End of Addendum ***
EXAM:
OVER-READ INTERPRETATION  CT CHEST

The following report is an over-read performed by radiologist Dr.
Jubelyn Dammert [REDACTED] on 04/24/2019. This
over-read does not include interpretation of cardiac or coronary
anatomy or pathology. The coronary calcium score/coronary CTA
interpretation by the cardiologist is attached.
FINDINGS: Extracardiac findings will be described separately under dictation
for contemporaneously obtained CTA chest, abdomen and pelvis.
IMPRESSION: Please see separate dictation for contemporaneously obtained CTA
chest, abdomen and pelvis 04/24/2019 for full description of
relevant extracardiac findings.

## 2021-08-09 IMAGING — DX DG CHEST 1V PORT
1 series · 1 of 1 positions shown · non-contrast
Comparison: 08/31/2018

CLINICAL DATA: Shortness of breath since [REDACTED].

EXAM:
PORTABLE CHEST 1 VIEW

[chest ap]
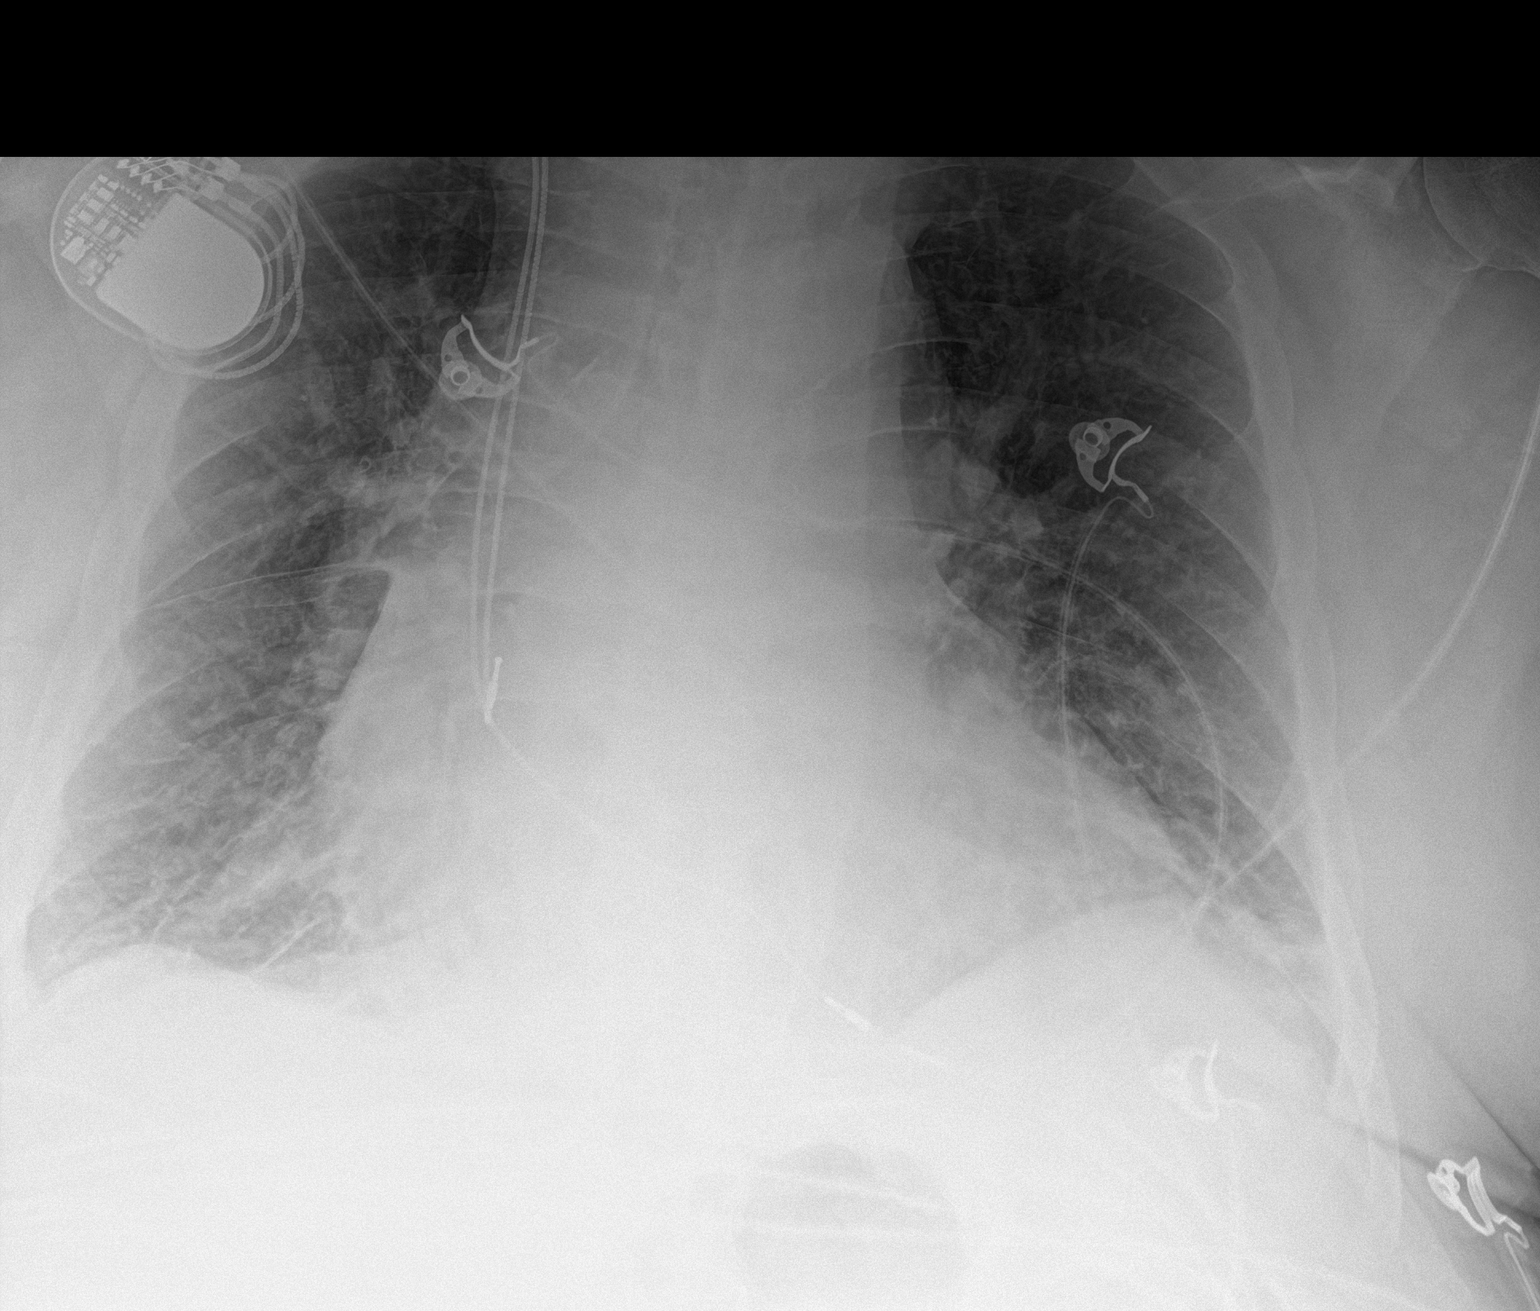

[1 of 1 positions shown; findings below may reference images not displayed]

FINDINGS: Right-sided pacemaker unchanged. Lungs are adequately inflated with
minimal prominence of the perihilar vessels suggesting mild vascular
congestion. Mild linear atelectasis lateral left base. Stable
cardiomegaly. Remainder of the exam is unchanged.
IMPRESSION: Cardiomegaly with suggestion of mild vascular congestion. Minimal
linear atelectasis left base.

## 2021-08-13 IMAGING — DX DG CHEST 2V
2 series · 2 of 2 positions shown · non-contrast
Comparison: 04/28/2019

CLINICAL DATA: Severe aortic stenosis

EXAM:
CHEST - 2 VIEW

[chest lat]
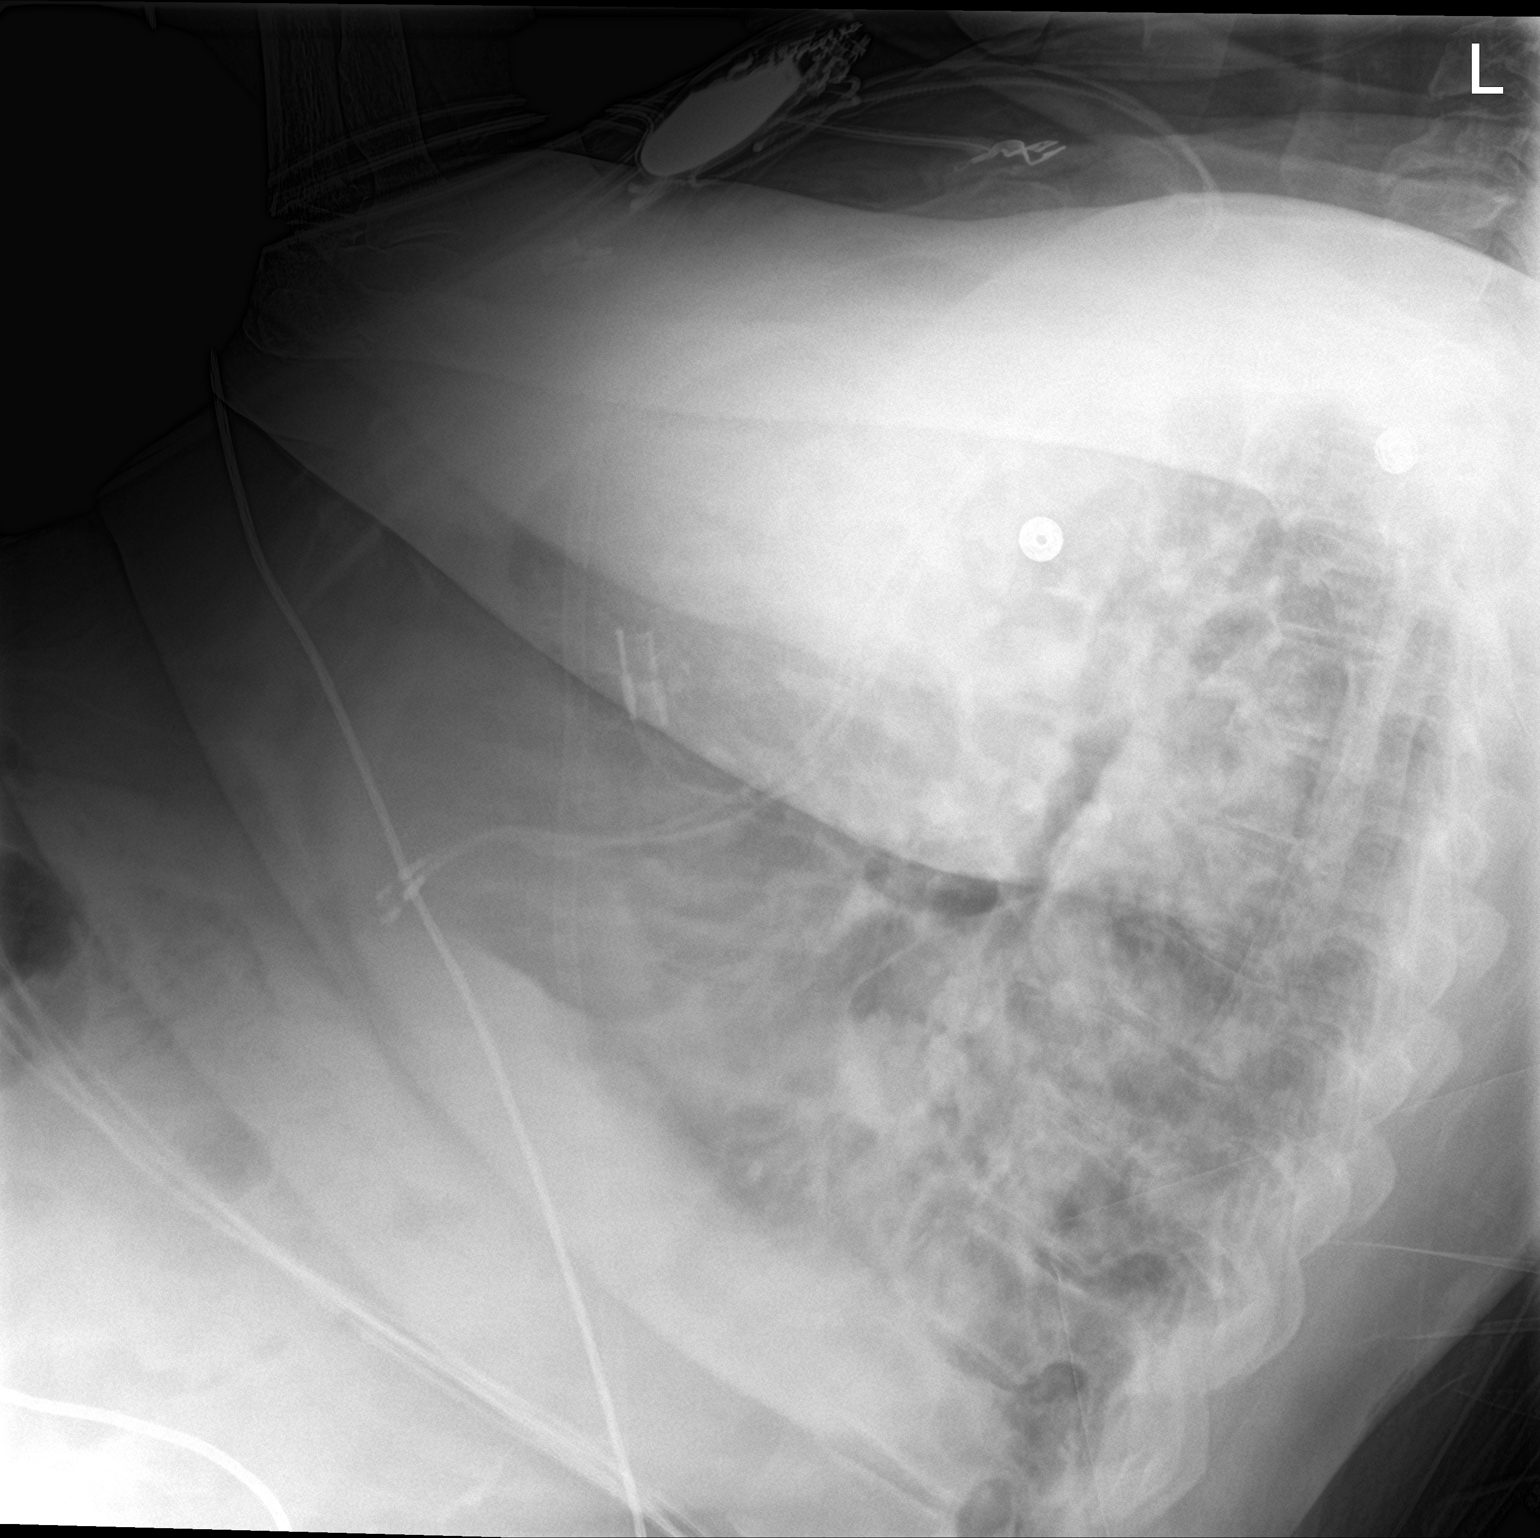

[chest ap]
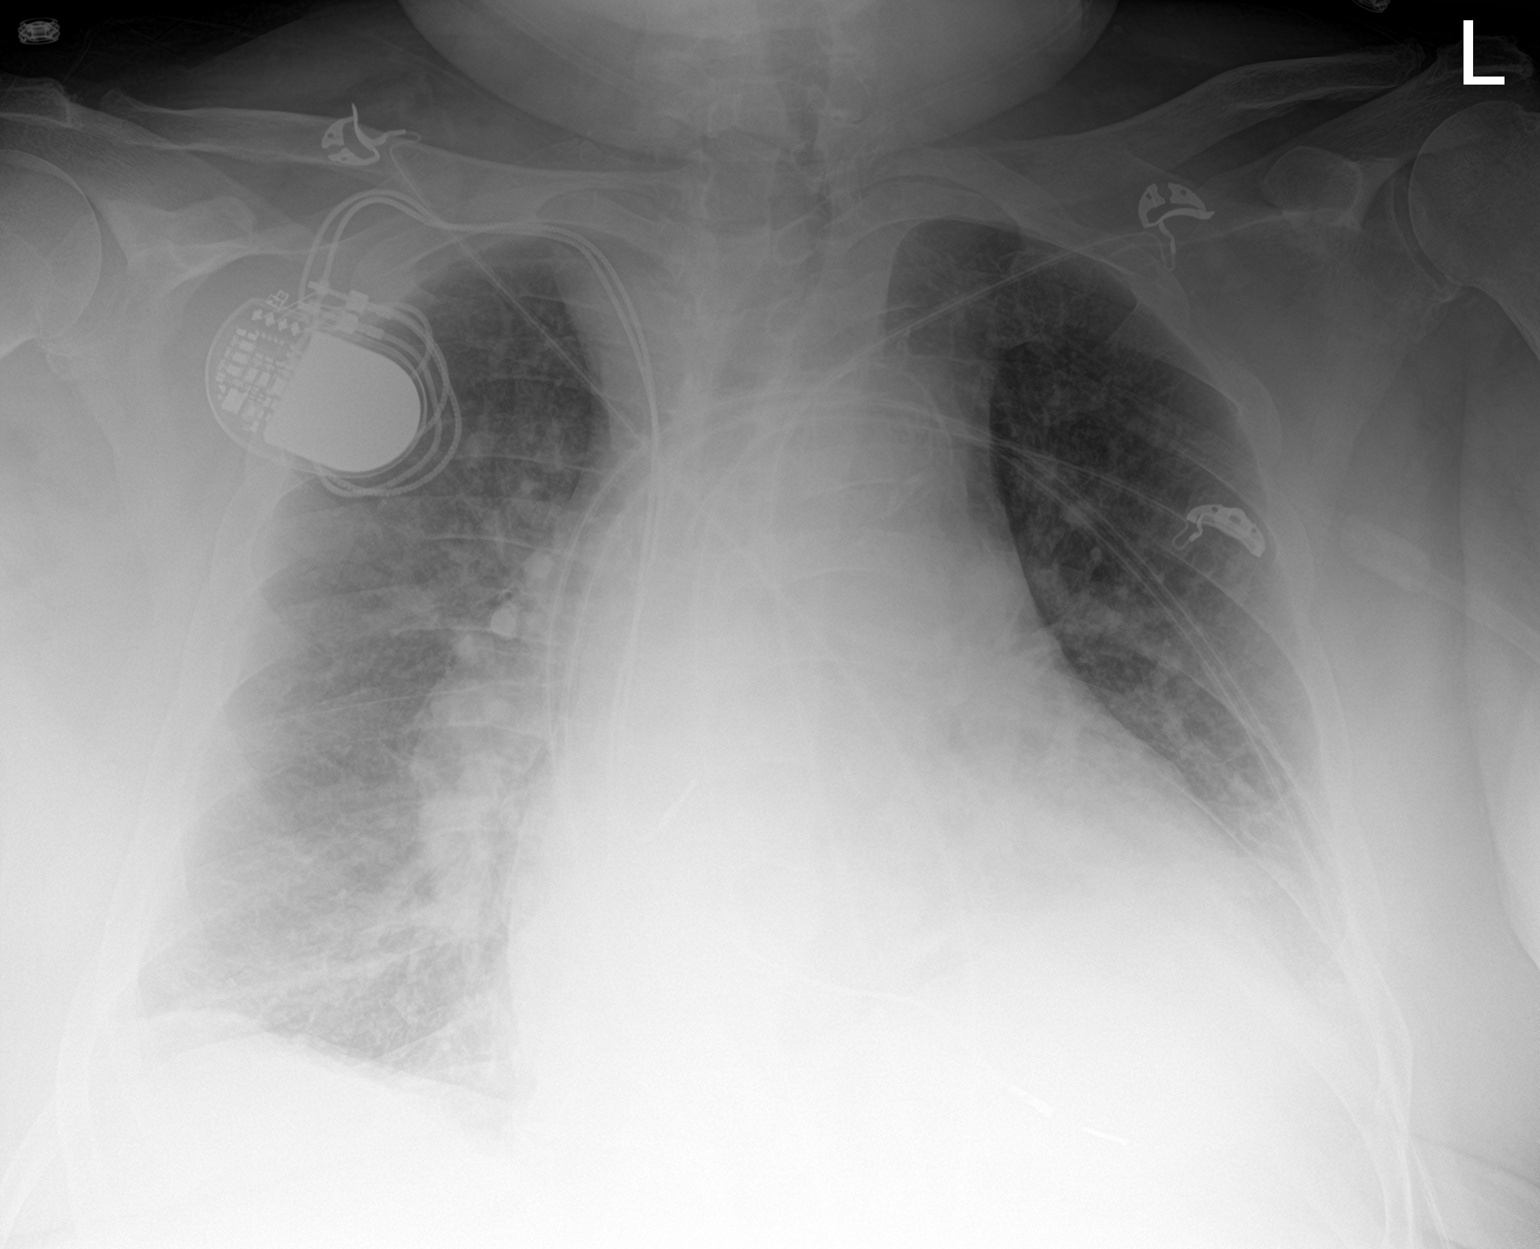

[2 of 2 positions shown; findings below may reference images not displayed]

FINDINGS: RIGHT-sided transvenous pacemaker with leads to the RIGHT atrium and
RIGHT ventricle. The heart is enlarged and stable in configuration.
There is pulmonary vascular congestion without overt edema. There is
volume loss and increased atelectasis at the LEFT lung base. Early
infiltrate would be difficult to exclude.
IMPRESSION: 1. Stable cardiomegaly.  Pulmonary vascular congestion.
2. Atelectasis or early infiltrate at the LEFT lung base.

## 2021-12-12 IMAGING — NM NM TUMOR LOCAL/TRACER DISTR WITH SPECT
5 series · 25 of 25 positions shown · non-contrast
Comparison: none

CLINICAL DATA: HEART FAILURE. CONCERN FOR CARDIAC AMYLOIDOSIS.
Pacemaker. Short of breath. CHF

EXAM:
NUCLEAR MEDICINE TUMOR LOCALIZATION. PYP CARDIAC AMYLOIDOSIS SCAN
WITH SPECT
TECHNIQUE: Following intravenous administration of radiopharmaceutical,
anterior planar images of the chest were obtained. Regions of
interest were placed on the heart and contralateral chest wall for
quantitative assessment. Additional SPECT imaging of the chest was
obtained.
RADIOPHARMACEUTICALS:  Twenty-one mCi TECHNETIUM 99 PYROPHOSPHATE

[Series 1: amyloid spect_(id)_tra · 4.1mm · 4.14mm/px · 6 of 128 frames shown]
[frame 11/128]
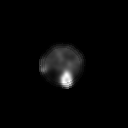
[frame 32/128]
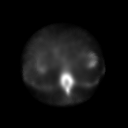
[frame 54/128]
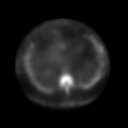
[frame 75/128]
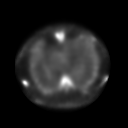
[frame 96/128]
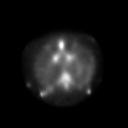
[frame 118/128]
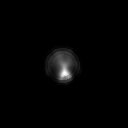

[Series 1: anterior · 4.14mm/px · 1 of 1 slices shown]
[im 1/1]
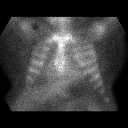

[Series 1: amyloid spect_(id)_cor · 4.1mm · 4.14mm/px · 6 of 128 frames shown]
[frame 11/128]
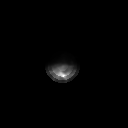
[frame 32/128]
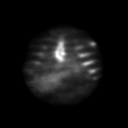
[frame 54/128]
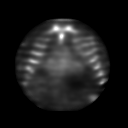
[frame 75/128]
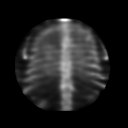
[frame 96/128]
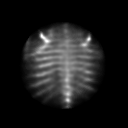
[frame 118/128]
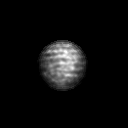

[Series 1: amyloid spect_(id) · 4.1mm · 4.14mm/px · 6 of 128 frames shown]
[frame 11/128]
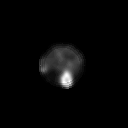
[frame 32/128]
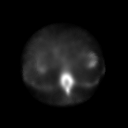
[frame 54/128]
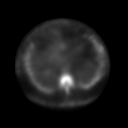
[frame 75/128]
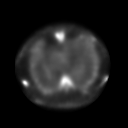
[frame 96/128]
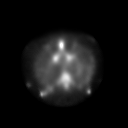
[frame 118/128]
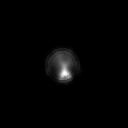

[Series 2: amyloid spect · 4.14mm/px · 6 of 64 frames shown]
[frame 6/64  full-range]
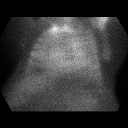
[frame 16/64  full-range]
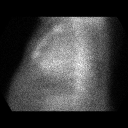
[frame 27/64  full-range]
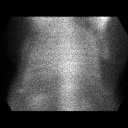
[frame 38/64  full-range]
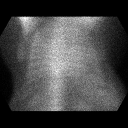
[frame 48/64  full-range]
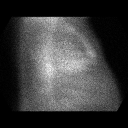
[frame 59/64  full-range]
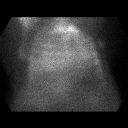

[25 of 25 positions shown; findings below may reference images not displayed]

FINDINGS: Planar Visual assessment:

Anterior planar imaging demonstrates radiotracer uptake within the
heart less than uptake within the adjacent ribs (Grade 1).

Quantitative assessment :

Quantitative assessment of the cardiac uptake compared to the
contralateral chest wall is equal to 1.2 (H/CL = 1.2).

SPECT assessment: SPECT imaging of the chest demonstrates minimal
radiotracer accumulation within the LEFT ventricle.
IMPRESSION: Visual and quantitative assessment (grade 1, H/CL equal 1.2) are Not
suggestive of transthyretin amyloidosis.

## 2022-03-02 IMAGING — DX DG BONE SURVEY MET
9 of 10 series · 9 of 10 positions shown · non-contrast
Comparison: None.

CLINICAL DATA: Metastatic bone survey.  Monoclonal gammopathy.

EXAM:
METASTATIC BONE SURVEY

[skull lat]
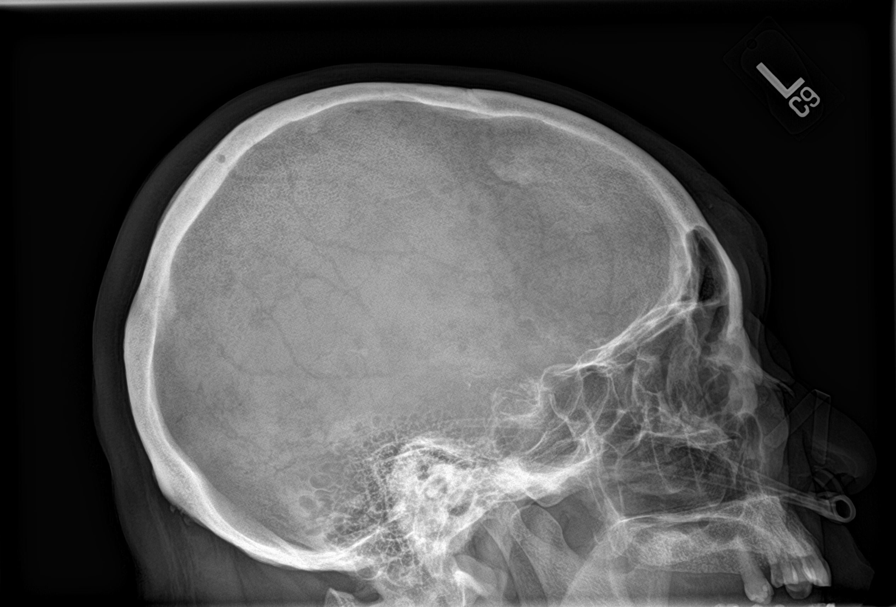

[shoulder ap]
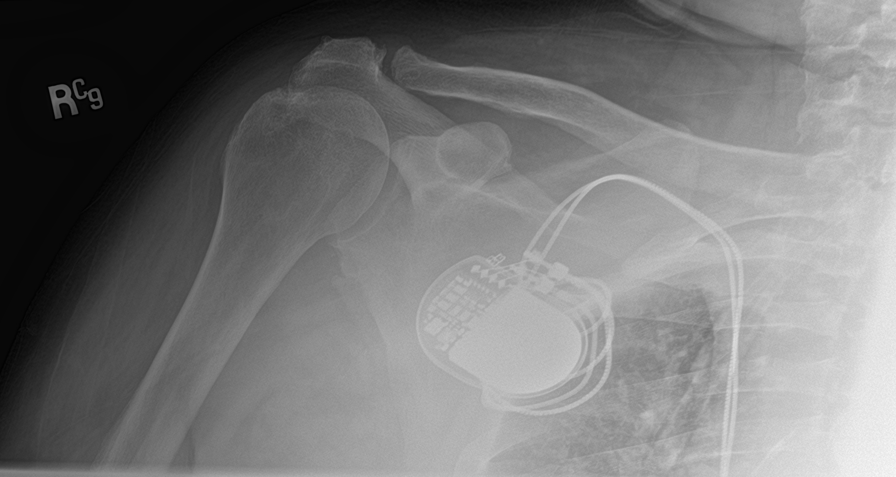

[humerus ap]
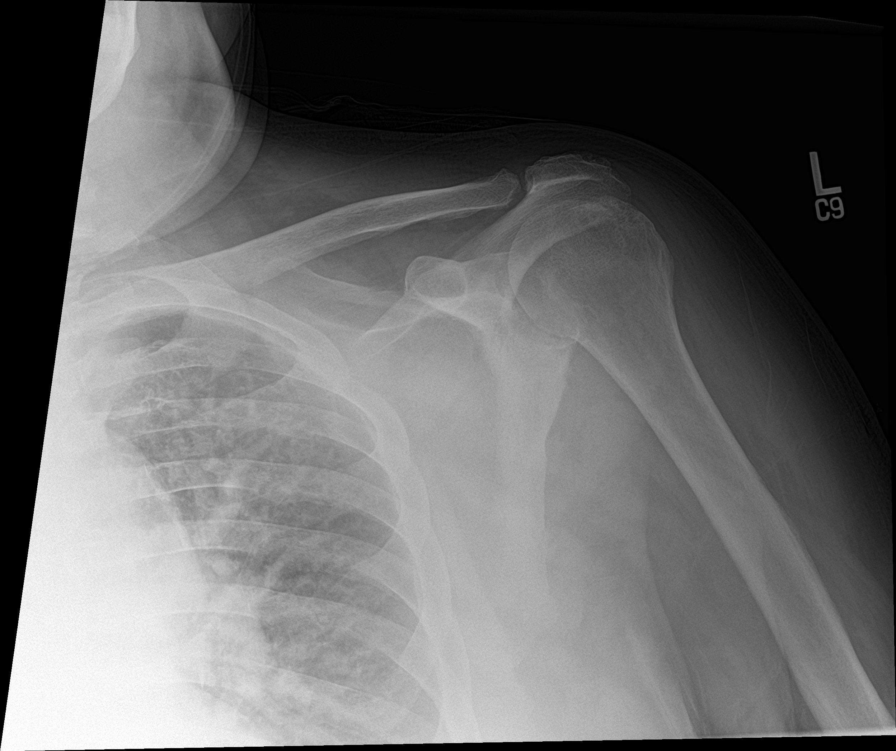

[c-spine lat]
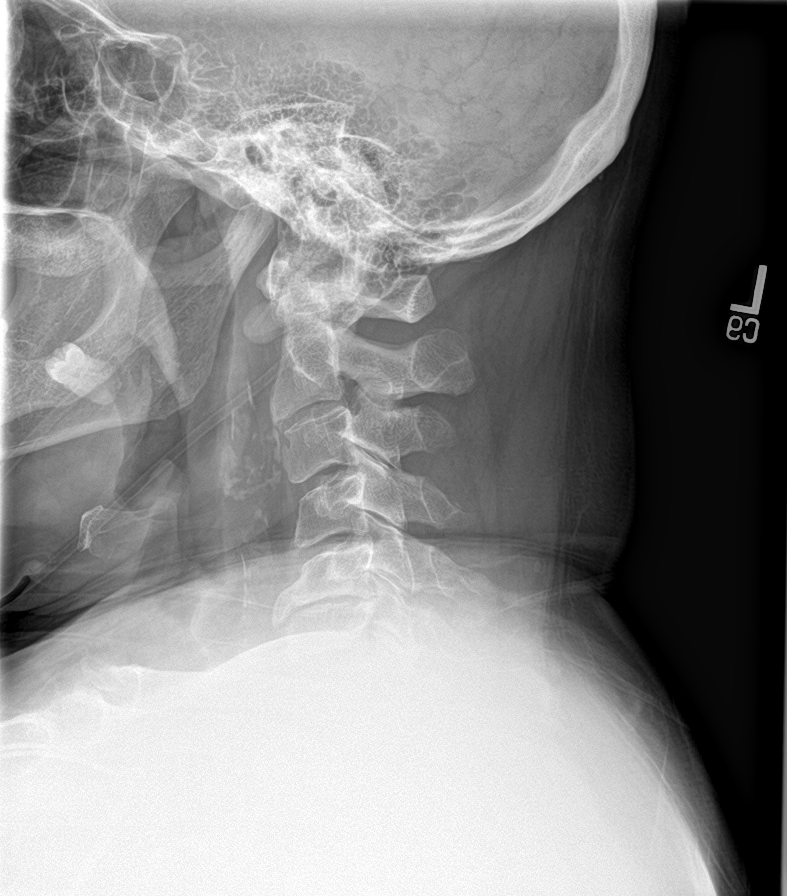

[c-spine swimmers]
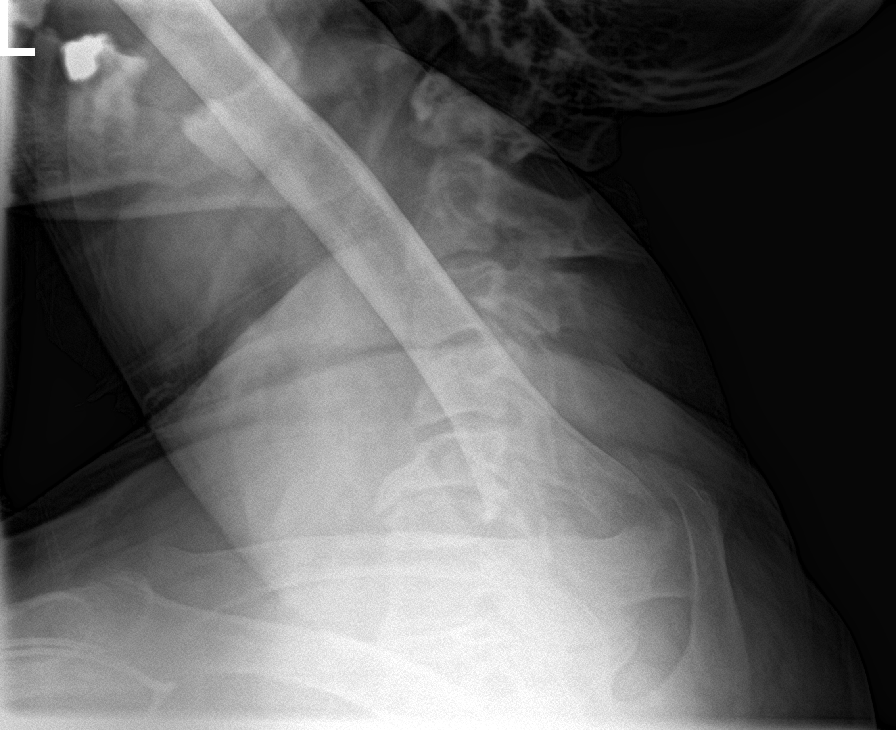

[chest ap]
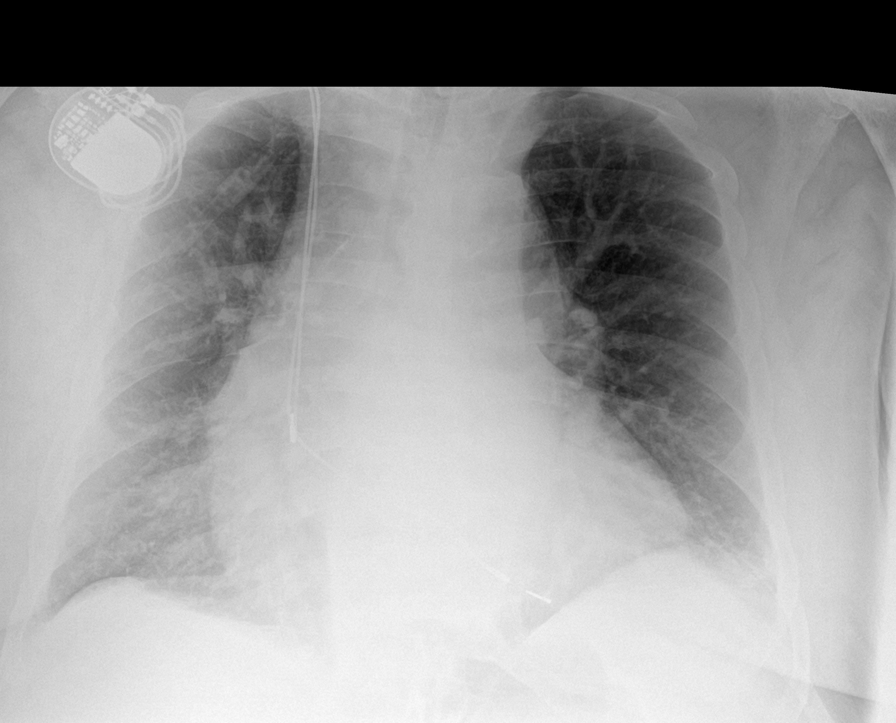

[l-spine ap]
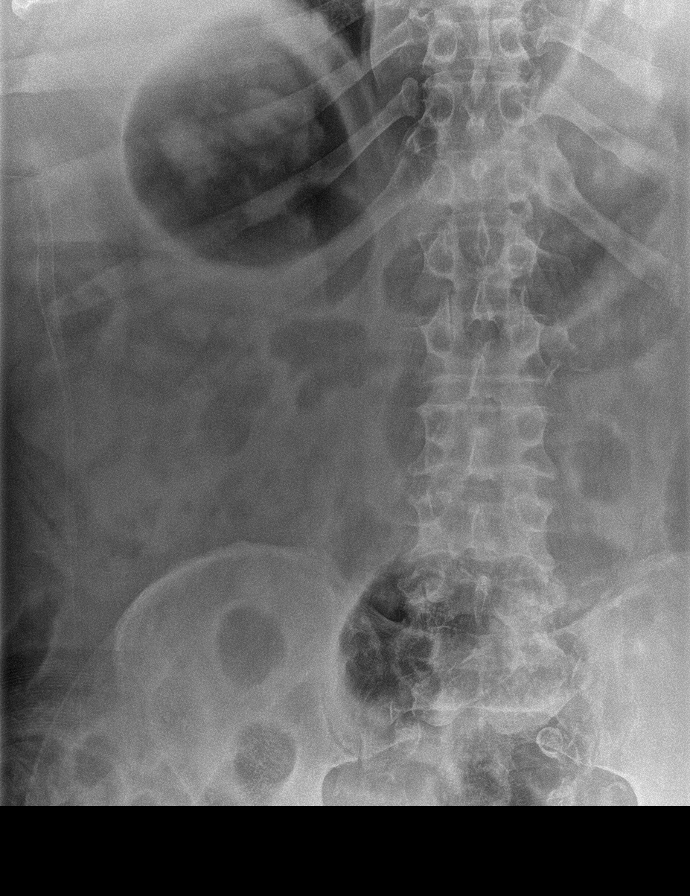

[l-spine lat]
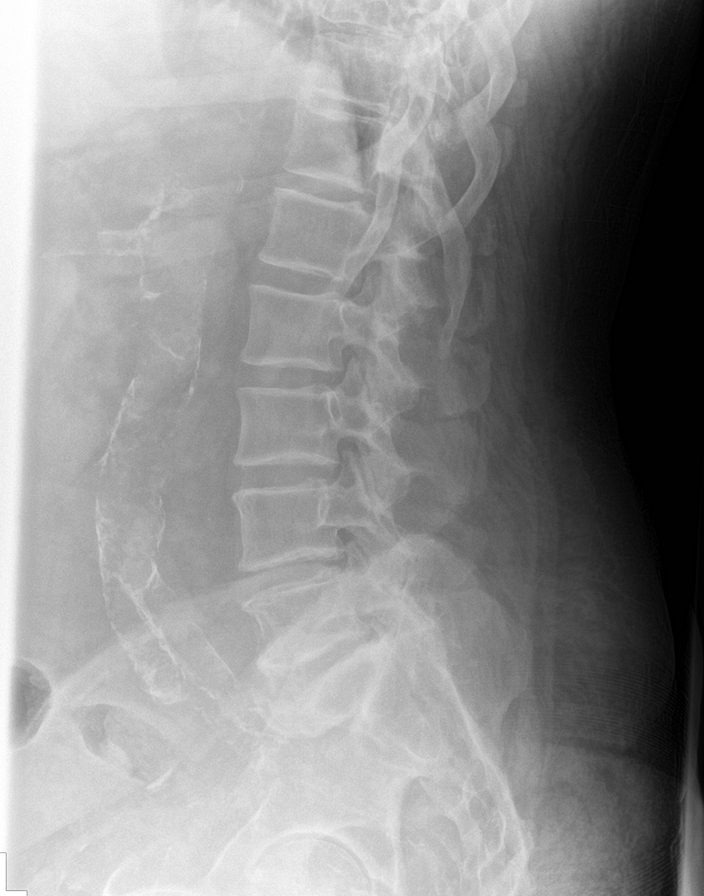

[t-spine ap]
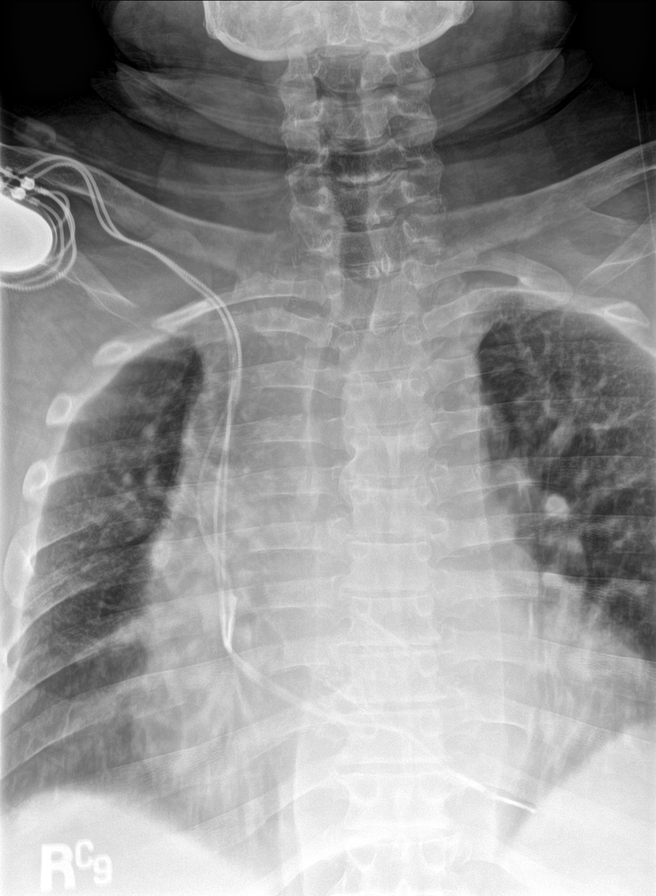

[9 of 10 positions shown; findings below may reference images not displayed]

FINDINGS: Lateral skull: Negative

Right shoulder: No lytic lesion.

Left shoulder: No lytic lesion. Ordinary degenerative change.

Cervical spine: Loss of normal cervical lordosis. Mid cervical
spondylosis. No fracture or lytic change.

AP chest: Cardiomegaly. Aortic atherosclerosis. Pulmonary venous
hypertension. Lungs are clear. No rib lesion seen.

Thoracic spine: No fracture. No lytic lesion.

Lumbar spine: No fracture. No lytic lesion. Ordinary degenerative
lower lumbar show spondylosis.

AP pelvis: Limited detail. No lytic lesion.

Right femur: No lytic lesion.

Left femur: No lytic lesion.

Right tib fib: No lytic lesion.

Left tib-fib: No lytic lesion.

Right humerus: No lytic lesion.

Left humerus: No lytic lesion.

Right forearm: No lytic lesion.

Left forearm: No lytic lesion.
IMPRESSION: Negative for lytic bone lesions or fractures.

## 2022-05-08 IMAGING — US US ABDOMEN LIMITED
1 series · 14 of 25 positions shown · non-contrast
Comparison: 04/24/2019

CLINICAL DATA: Elevated LFTs

EXAM:
ULTRASOUND ABDOMEN LIMITED RIGHT UPPER QUADRANT

[Series 1: us abdomen limited ruq · 14 of 45 slices shown]
[im 1/45]
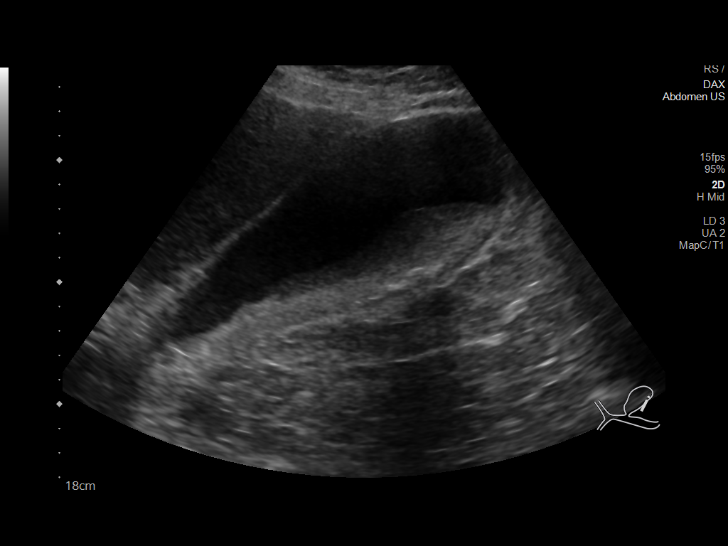
[im 4/45]
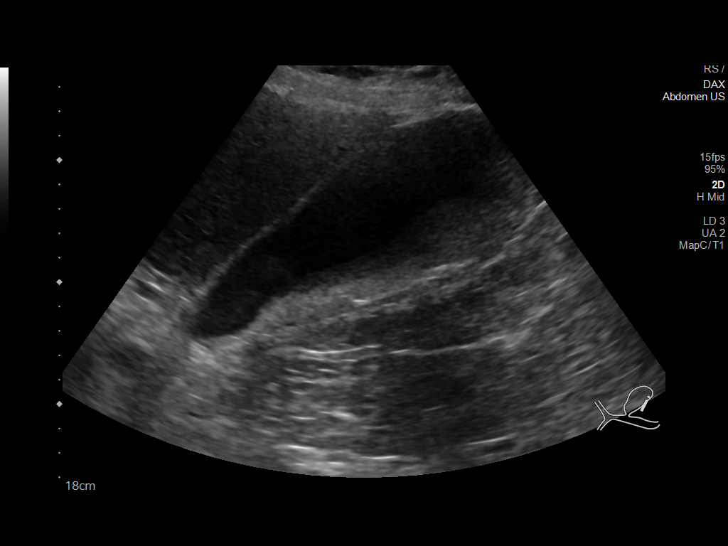
[im 8/45]
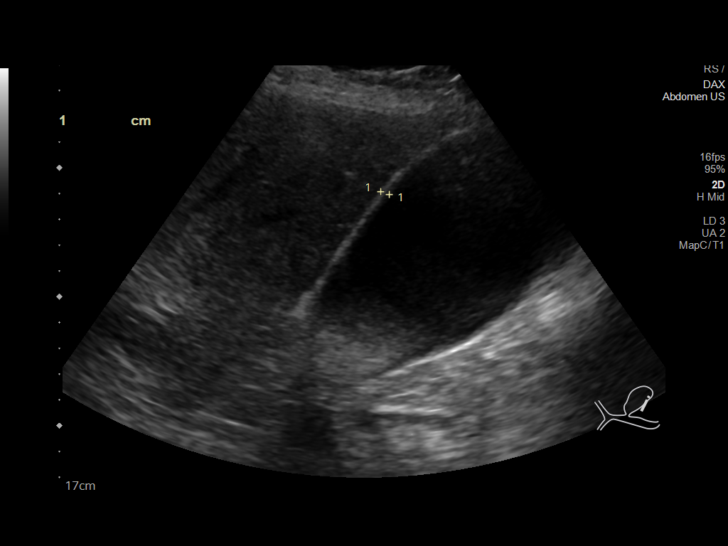
[im 12/45]
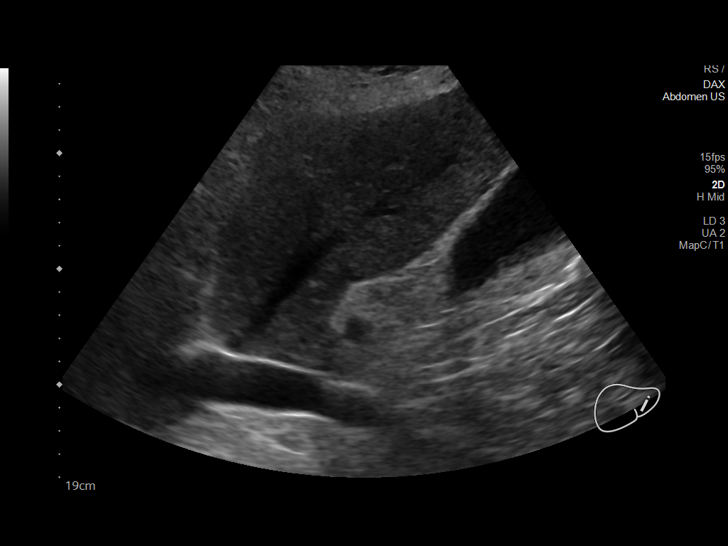
[im 15/45]
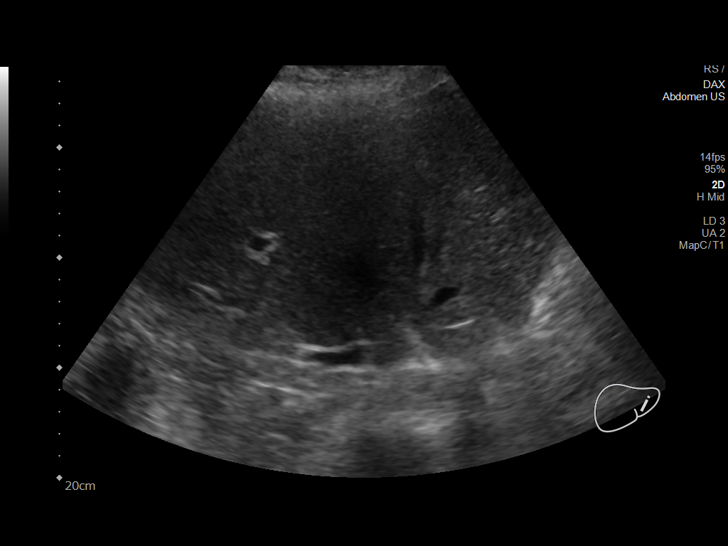
[im 17/45]
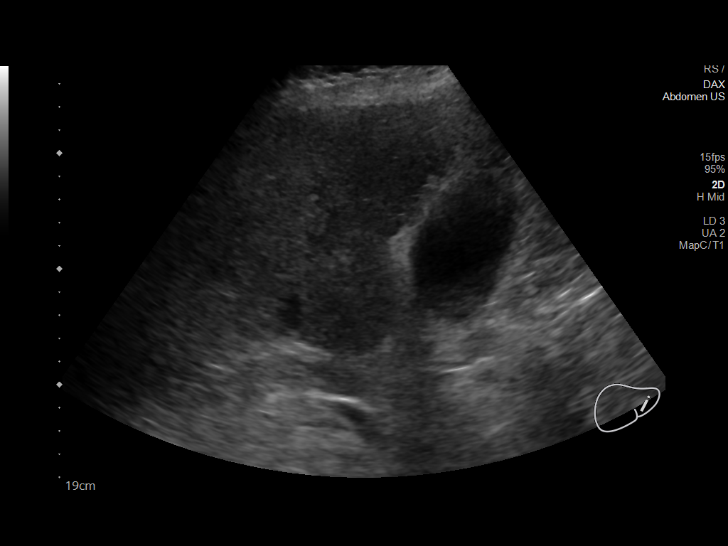
[im 21/45]
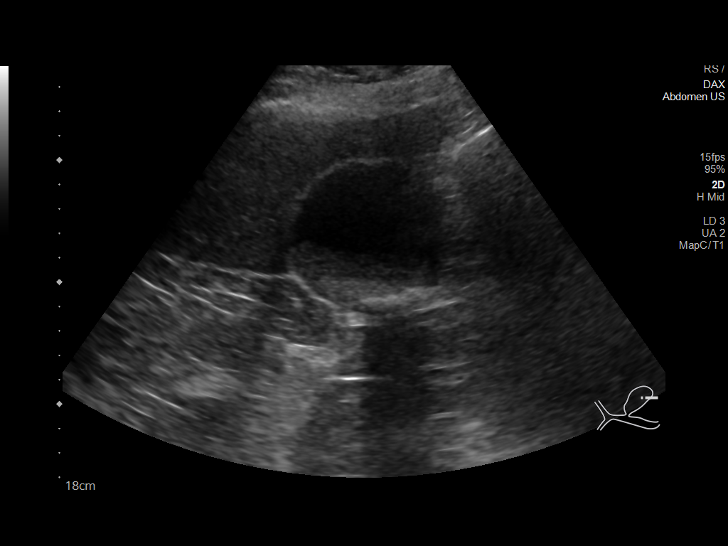
[im 24/45]
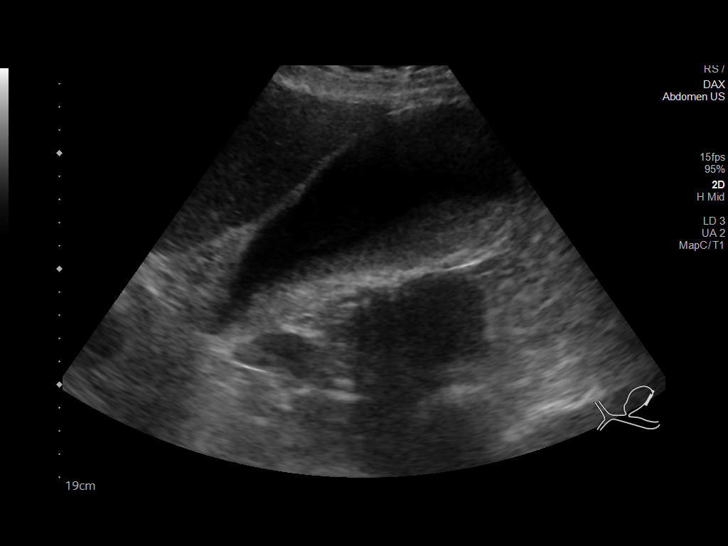
[im 28/45]
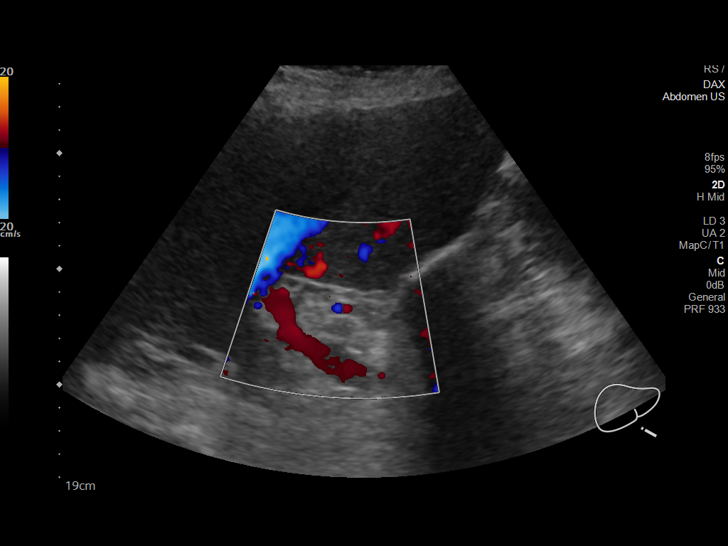
[im 30/45]
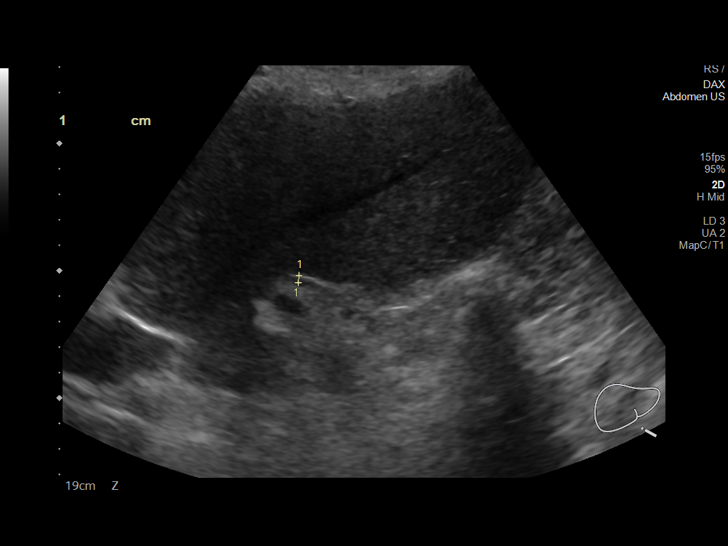
[im 34/45]
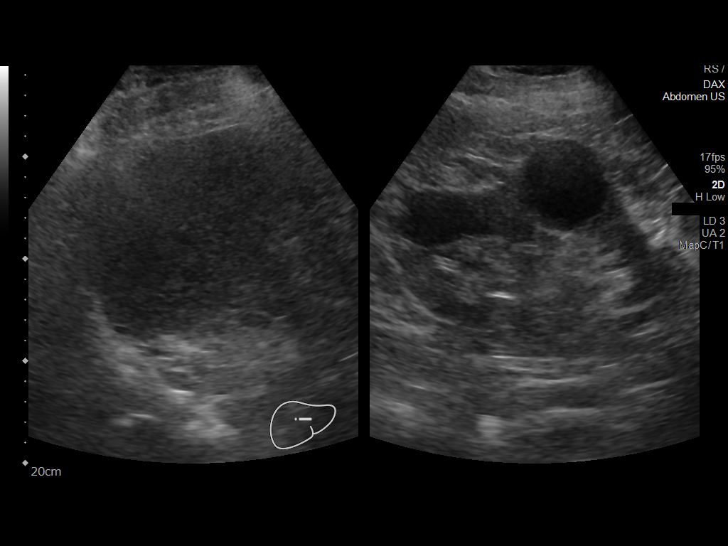
[im 37/45]
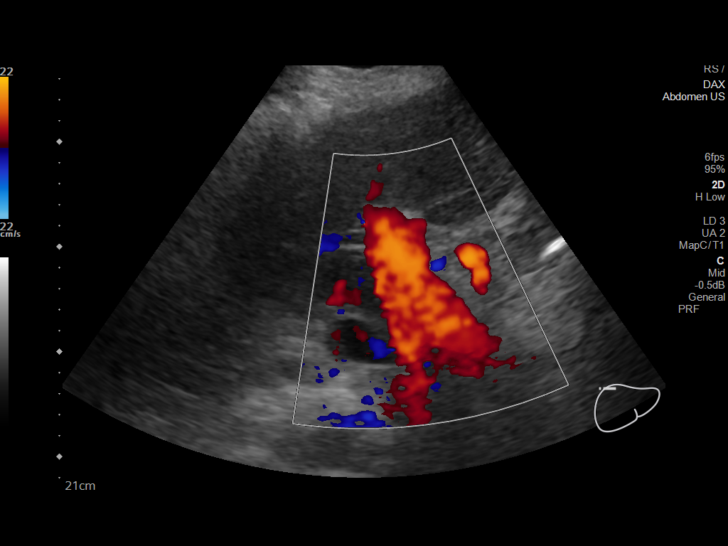
[im 41/45]
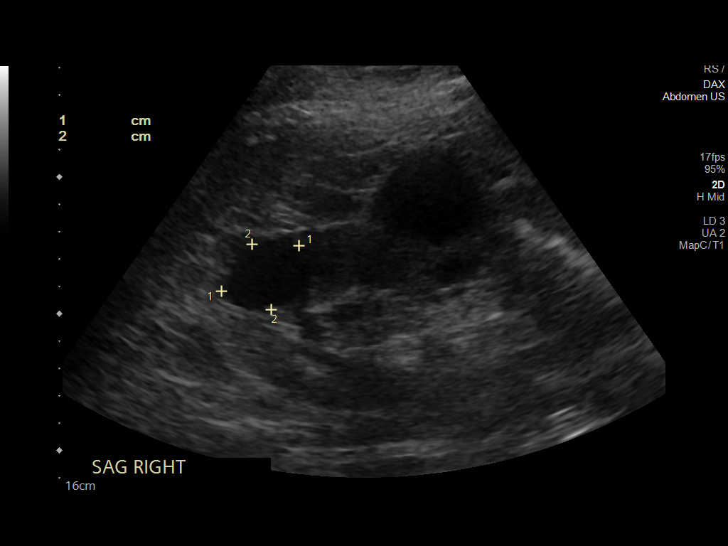
[im 45/45]
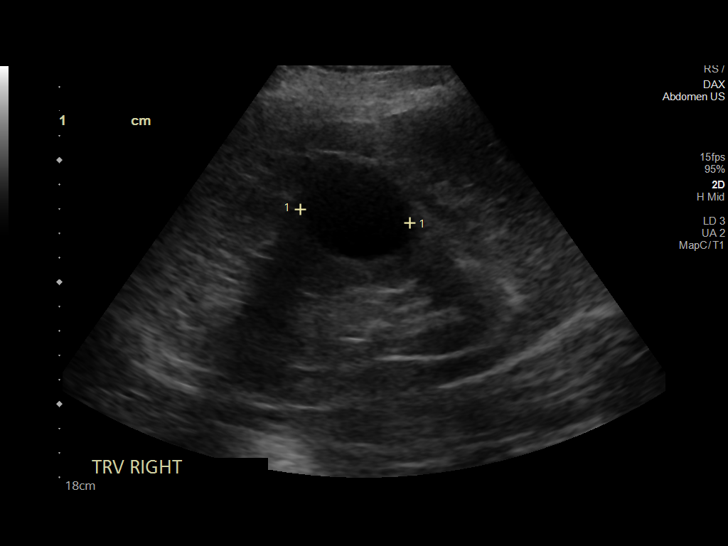

[14 of 25 positions shown; findings below may reference images not displayed]

FINDINGS: Gallbladder:

Well distended with gallbladder sludge and gallstones. No wall
thickening or pericholecystic fluid is noted. Negative sonographic
Murphy's sign is elicited.

Common bile duct:

Diameter: 2.8 mm

Liver:

Mild heterogeneity is noted likely related to fatty infiltration. No
focal mass is seen. Portal vein is patent on color Doppler imaging
with normal direction of blood flow towards the liver.

Other: Incidental note is made of cysts in the right kidney. The
largest of these measures 4.5 cm.
IMPRESSION: Gallbladder sludge and gallstones without complicating factors.

Right renal cysts stable from prior CT examination.

## 2022-05-08 IMAGING — CT CT HEAD W/O CM
4 of 6 series · 16 of 47 positions shown, 17 images · non-contrast
Comparison: February 28, 2019

CLINICAL DATA: Altered mental status/confusion. Headache for
several days

EXAM:
CT HEAD WITHOUT CONTRAST
TECHNIQUE: Contiguous axial images were obtained from the base of the skull
through the vertex without intravenous contrast.

[Series 4: head bone · axial · 0.44mm/px · z∈[+964,+1088]mm · 7 of 89 slices shown]
[im 9/89  bone]
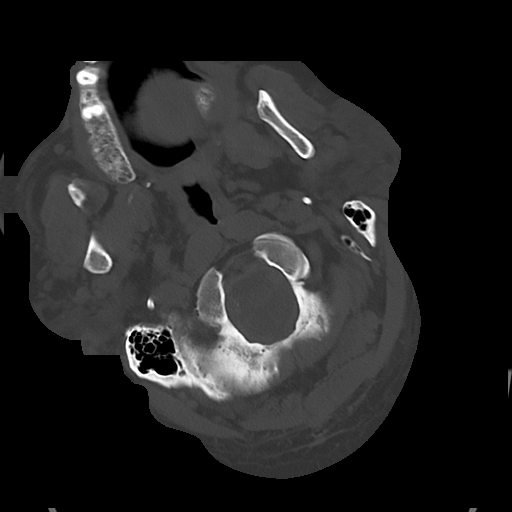
[im 18/89  bone]
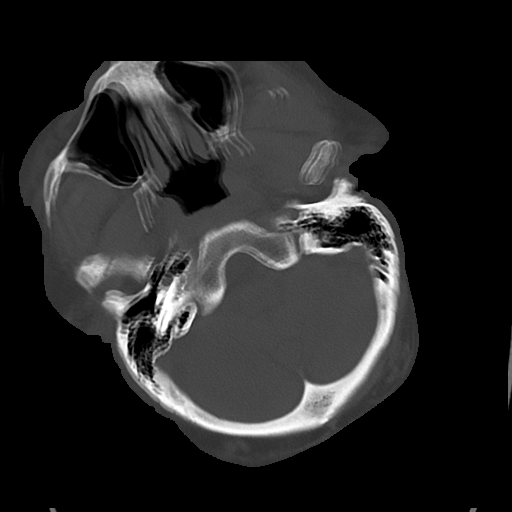
[im 27/89  bone]
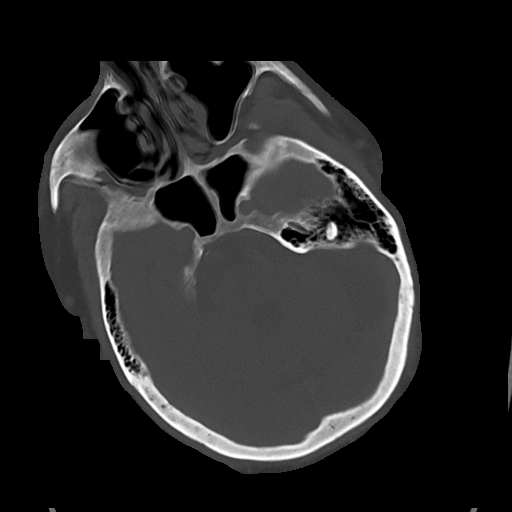
[im 36/89  bone]
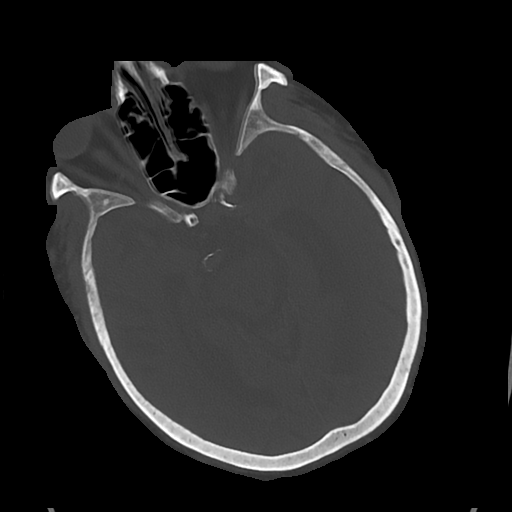
[im 53/89  bone]
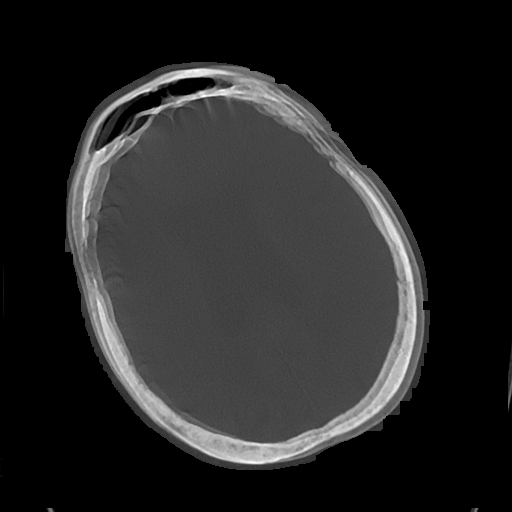
[im 62/89  bone]
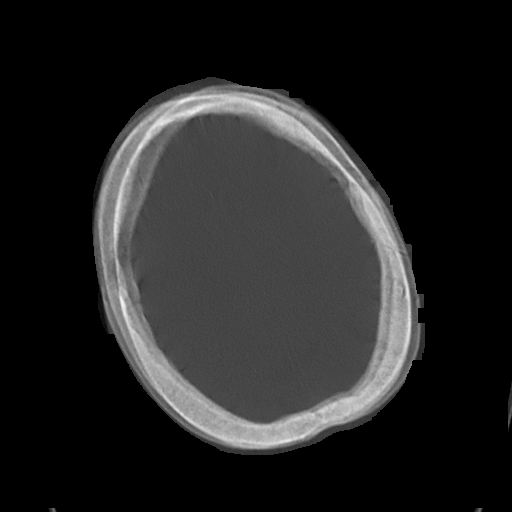
[im 71/89  bone]
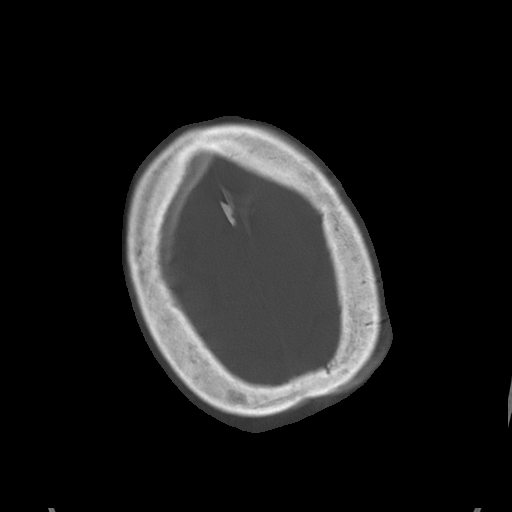

[Series 6: cor soft · coronal · 0.36mm/px · 3 of 81 slices shown]
[im 27/81  brain]
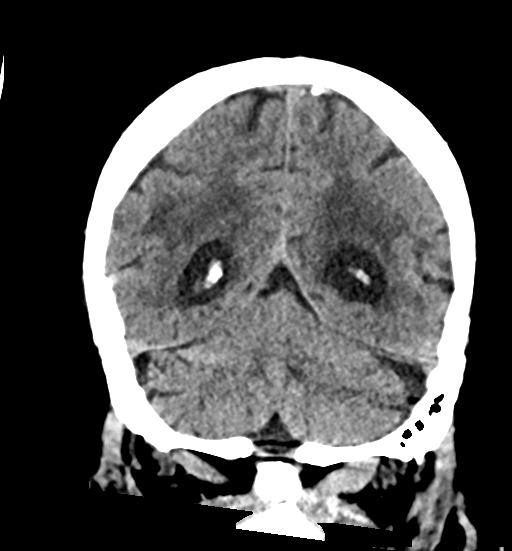
[im 36/81  brain]
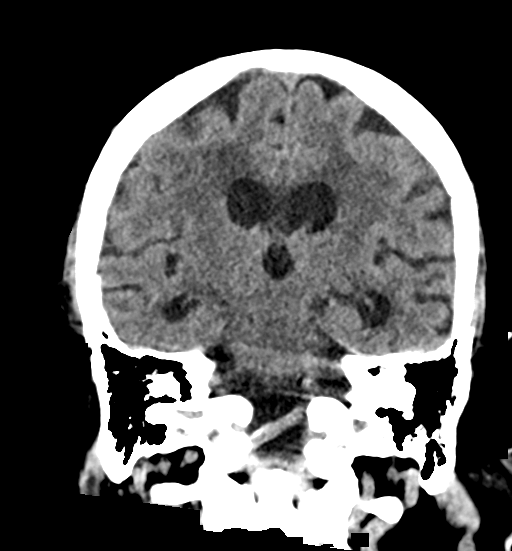
[im 45/81  brain]
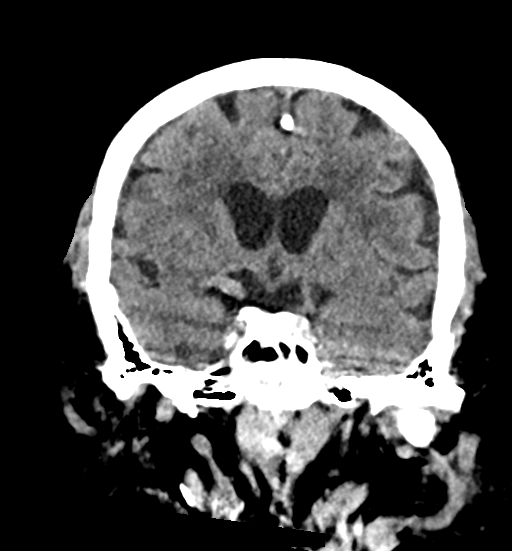

[Series 7: sag soft · sagittal · 0.38mm/px · 3 of 70 slices shown]
[im 24/70  brain]
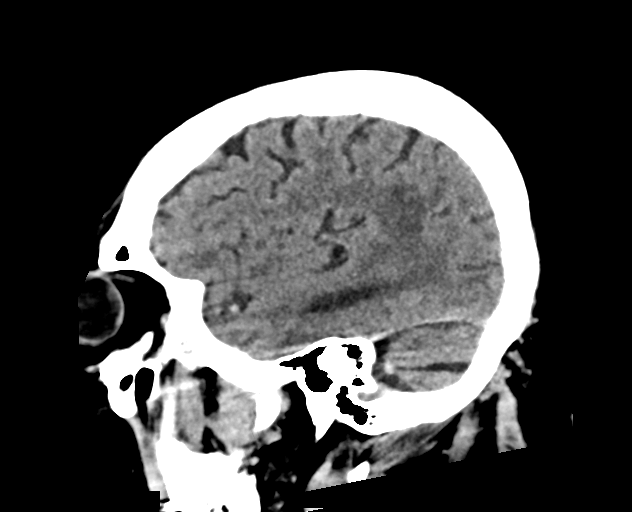
[im 35/70  brain]
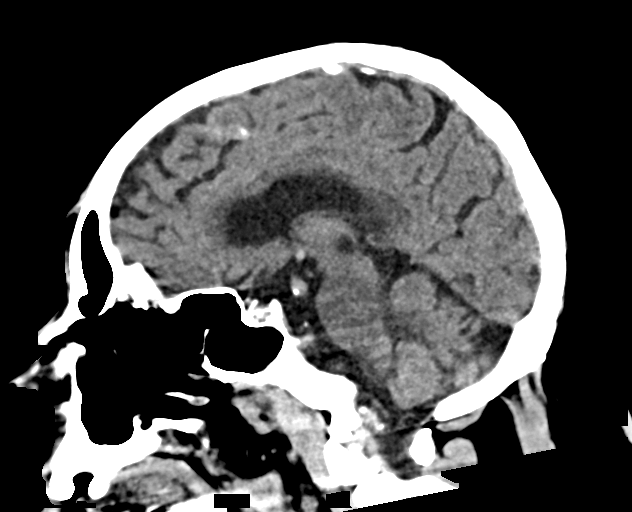
[im 47/70  brain]
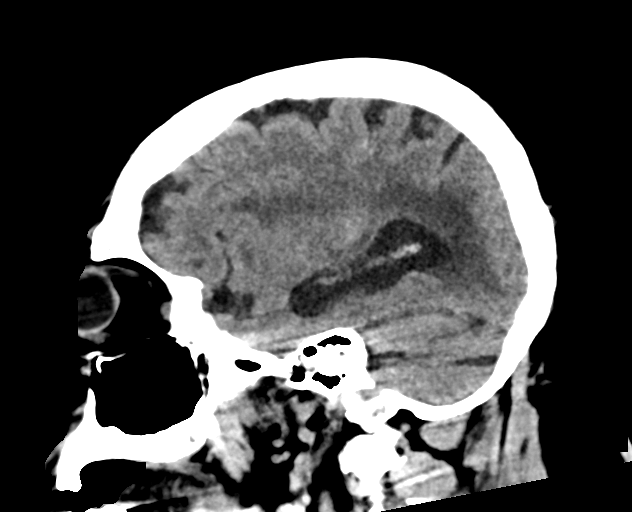

[Series 8: head wo · axial · 0.40mm/px · z∈[+983,+1073]mm · 3 of 38 slices shown, 4 images]
[im 10/38  brain]
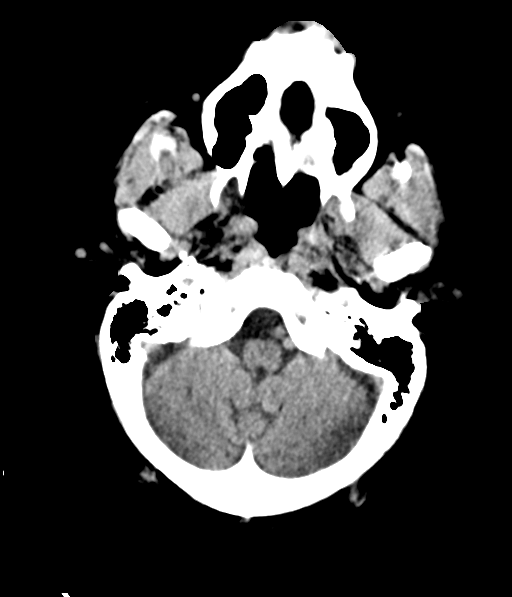
[im 10/38  bone]
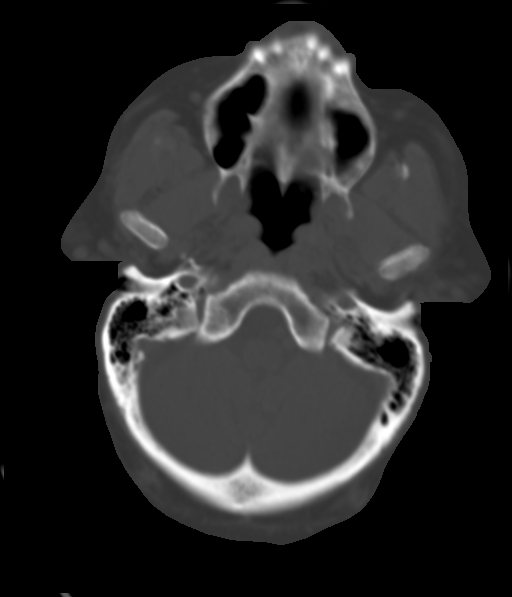
[im 19/38  brain]
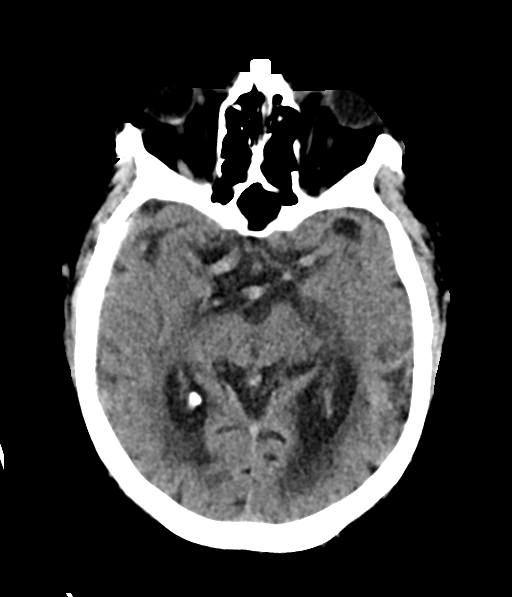
[im 28/38  brain]
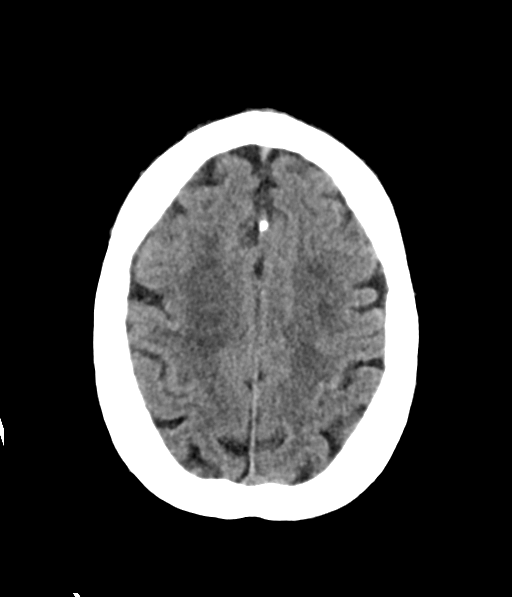

[16 of 47 positions shown; findings below may reference images not displayed]

FINDINGS: Brain: There is mild diffuse atrophy. There is no intracranial mass.
A mild amount of hemorrhage is noted in the dependent portion of
each occipital horn of the lateral ventricle. No other site of
hemorrhage hemorrhage is seen. There is no appreciable extra-axial
fluid or midline shift.

There is small vessel disease throughout the centra semiovale region
bilaterally. No acute appearing infarct is evident.

Vascular: No hyperdense vessels are evident. There is calcification
in the distal left vertebral artery and carotid siphon regions.

Skull: Bony calvarium appears intact.

Sinuses/Orbits: There is mucosal thickening in several ethmoid air
cells. Other paranasal sinuses which are visualized are clear.
Orbits appear symmetric bilaterally.

Other: Mastoid air cells are clear.
IMPRESSION: There is hemorrhage in the dependent portion of each lateral
ventricle of uncertain etiology. No brain parenchymal hemorrhage
elsewhere noted. No mass. No extra-axial fluid.

There is mild atrophy with extensive periventricular small vessel
disease. No acute appearing infarct is evident.

There are multiple foci of arterial vascular calcification.

There is mucosal thickening in several ethmoid air cells.

Critical Value/emergent results were called by telephone at the time
of interpretation on 01/25/2020 at [DATE] to provider Frumencio
Janu, who verbally acknowledged these results.

## 2022-05-08 IMAGING — DX DG CHEST 1V PORT
1 series · 1 of 1 positions shown · non-contrast
Comparison: Chest x-ray dated May 02, 2019.

CLINICAL DATA: Shortness of breath and chest pain.

EXAM:
PORTABLE CHEST 1 VIEW

[chest]
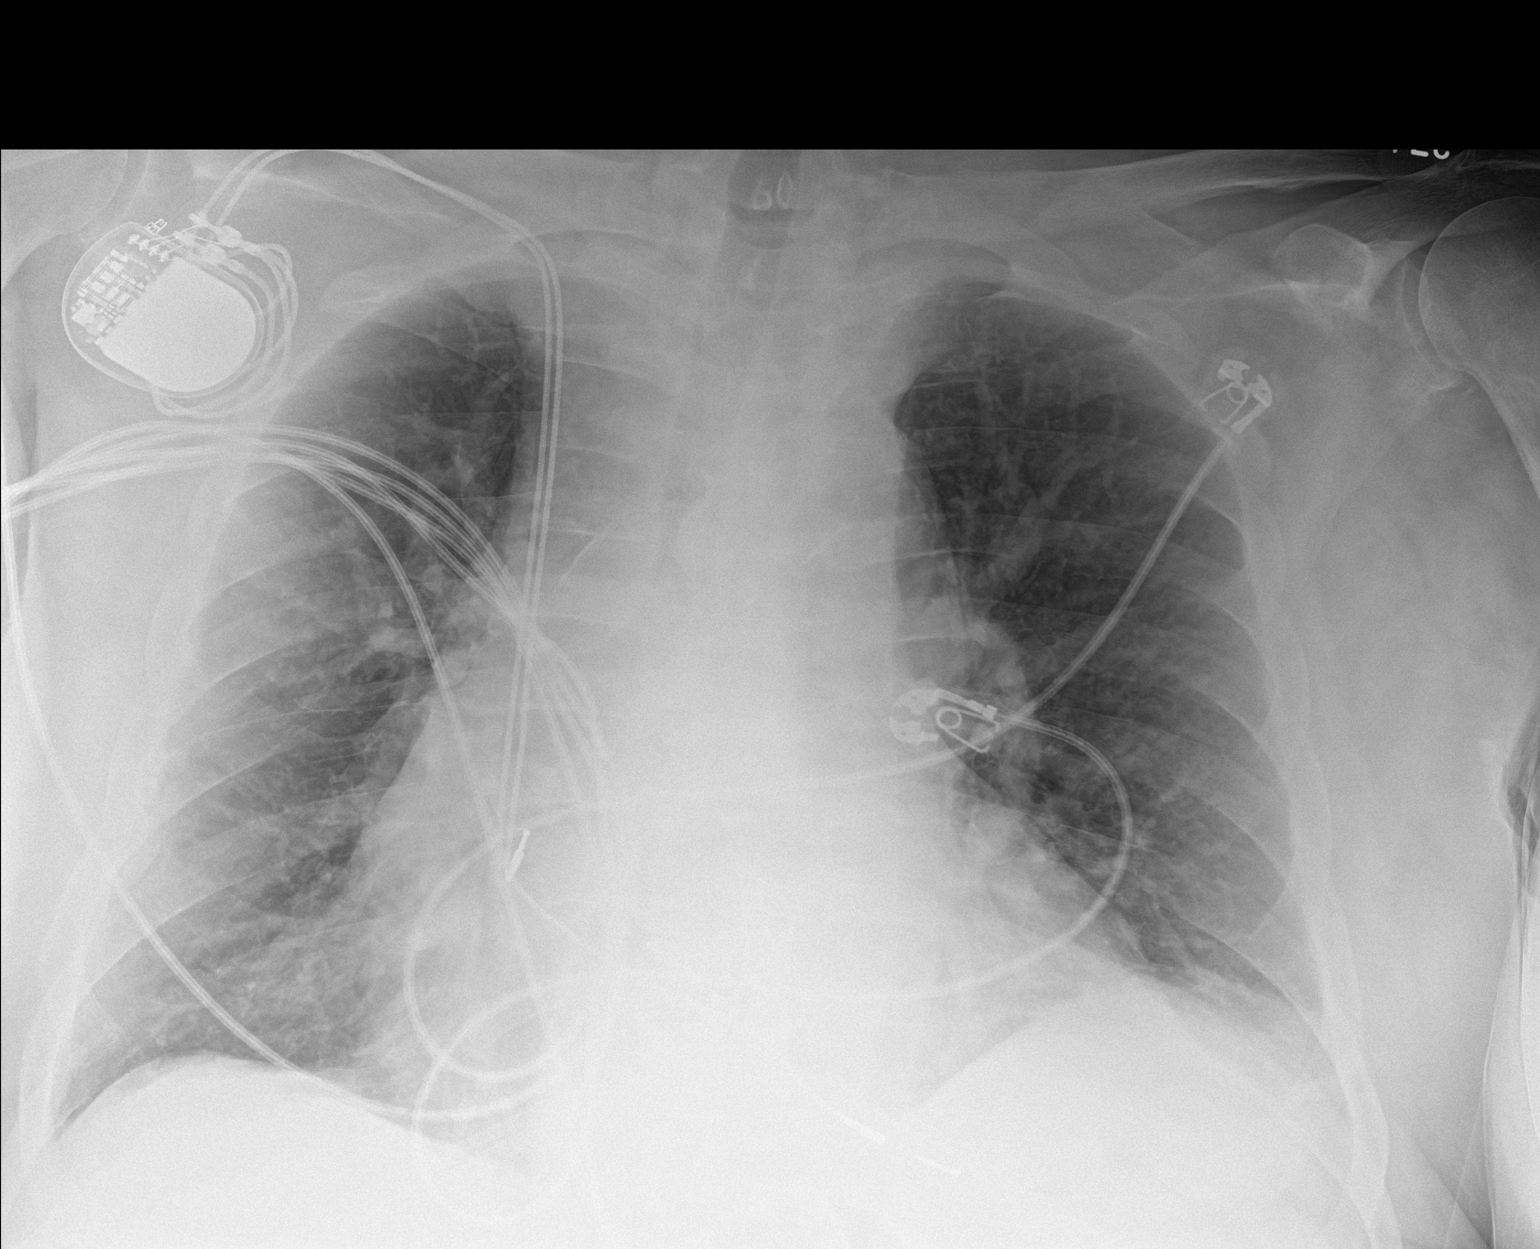

[1 of 1 positions shown; findings below may reference images not displayed]

FINDINGS: Unchanged right chest wall pacemaker. Unchanged cardiomegaly.
Chronic pulmonary vascular congestion. Mild left basilar
atelectasis. No focal consolidation, pleural effusion, or
pneumothorax. No acute osseous abnormality.
IMPRESSION: 1. No active disease. Chronic cardiomegaly and pulmonary vascular
congestion.

## 2022-05-09 IMAGING — CT CT HEAD W/O CM
4 series · 15 of 47 positions shown, 17 images · non-contrast
Comparison: None.

CLINICAL DATA: Stroke follow-up

EXAM:
CT HEAD WITHOUT CONTRAST
TECHNIQUE: Contiguous axial images were obtained from the base of the skull
through the vertex without intravenous contrast.

[Series 2: head without · axial · non-contrast · 0.39mm/px · z∈[+1000,+1130]mm · 7 of 36 slices shown, 9 images]
[im 5/36  brain]
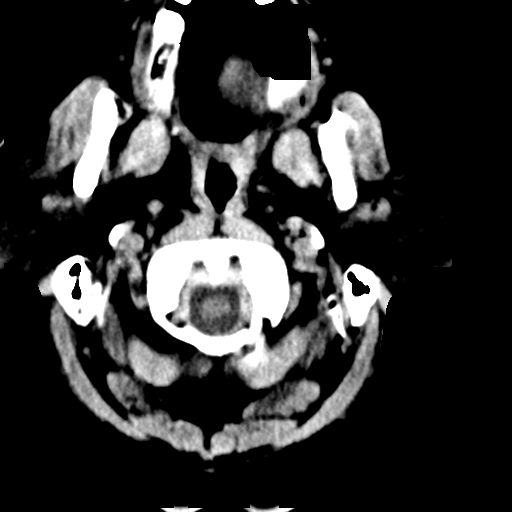
[im 5/36  bone]
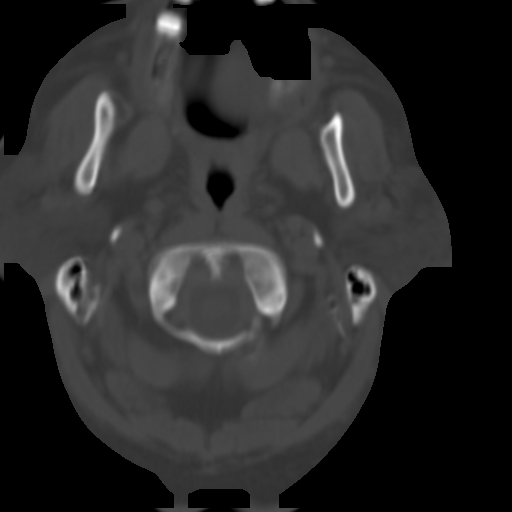
[im 9/36  brain]
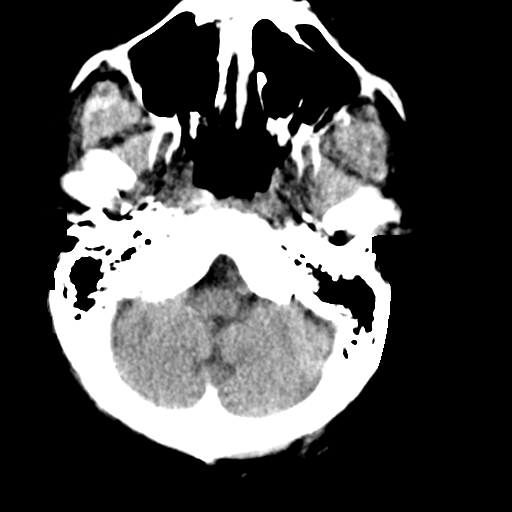
[im 14/36  brain]
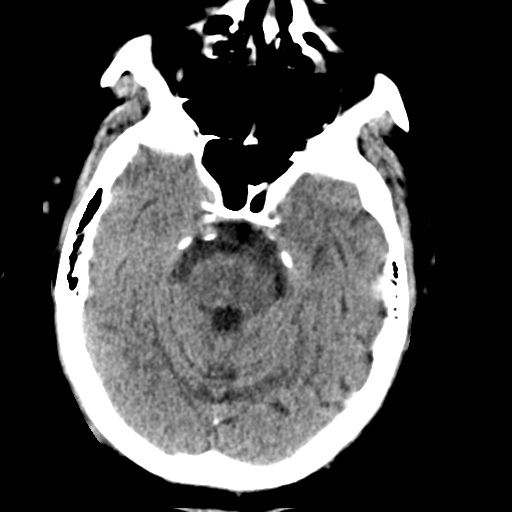
[im 18/36  brain]
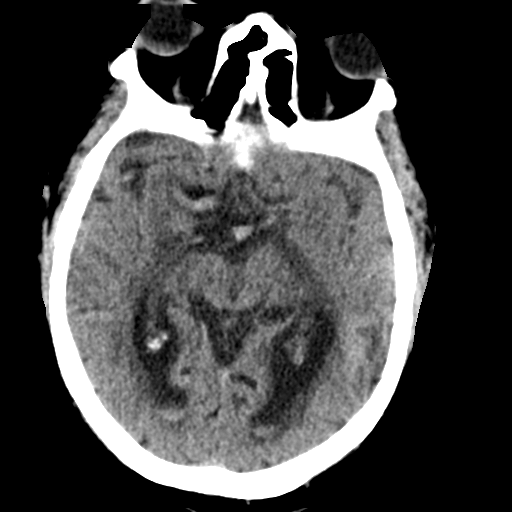
[im 22/36  brain]
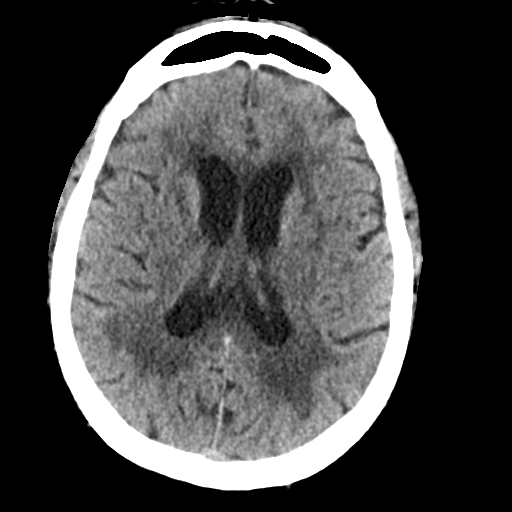
[im 22/36  bone]
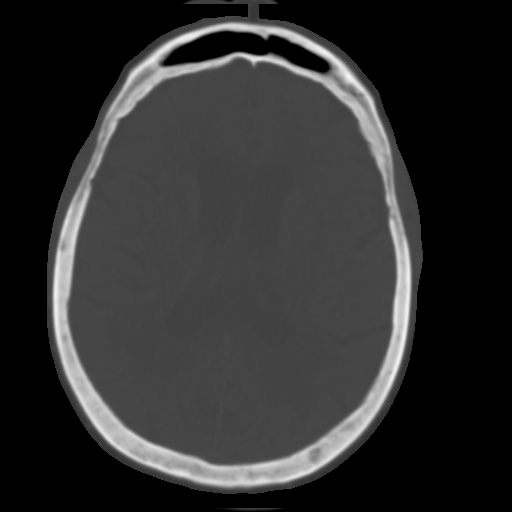
[im 27/36  brain]
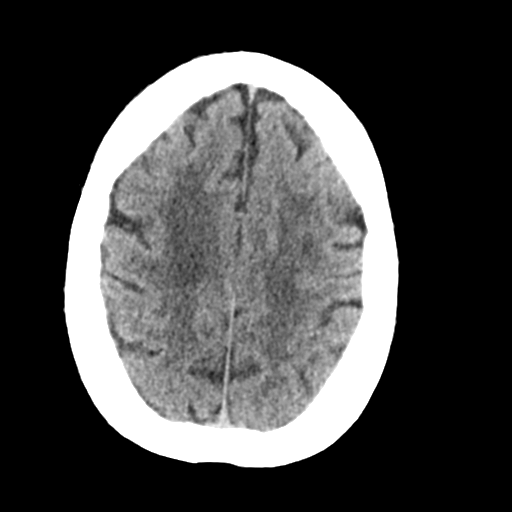
[im 31/36  brain]
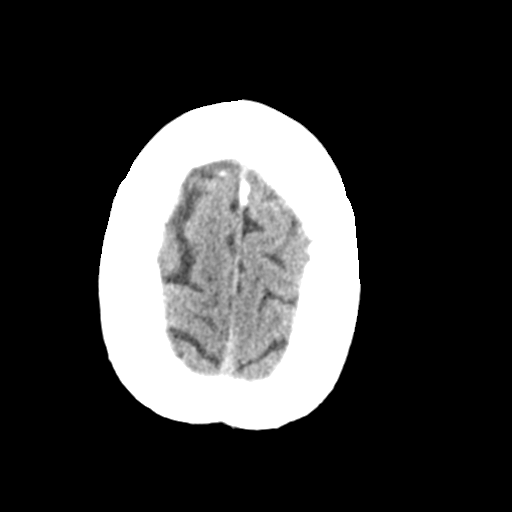

[Series 3: head bone · axial · 0.39mm/px · z∈[+996,+1014]mm · 2 of 90 slices shown]
[im 9/90  bone]
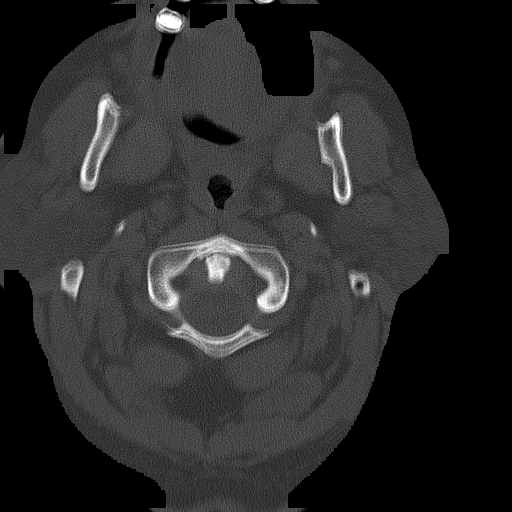
[im 18/90  bone]
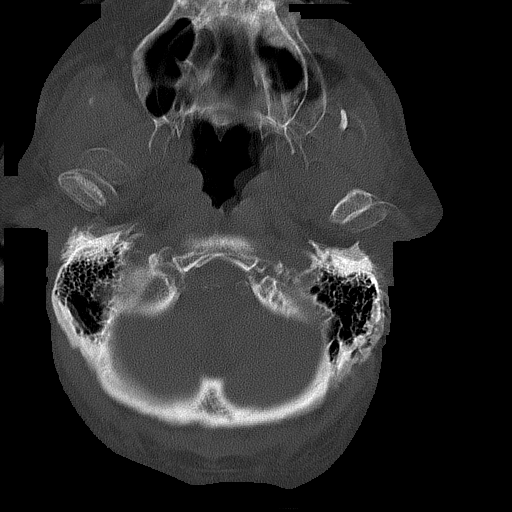

[Series 4: head without cor · coronal · non-contrast · 0.35mm/px · 3 of 90 slices shown]
[im 30/90  brain]
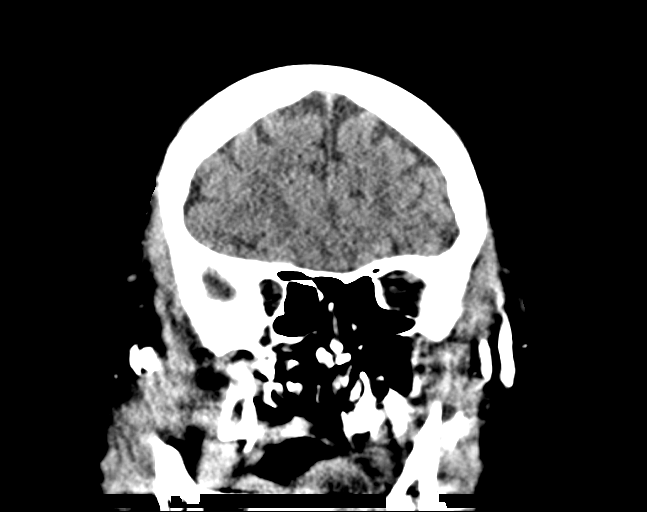
[im 40/90  brain]
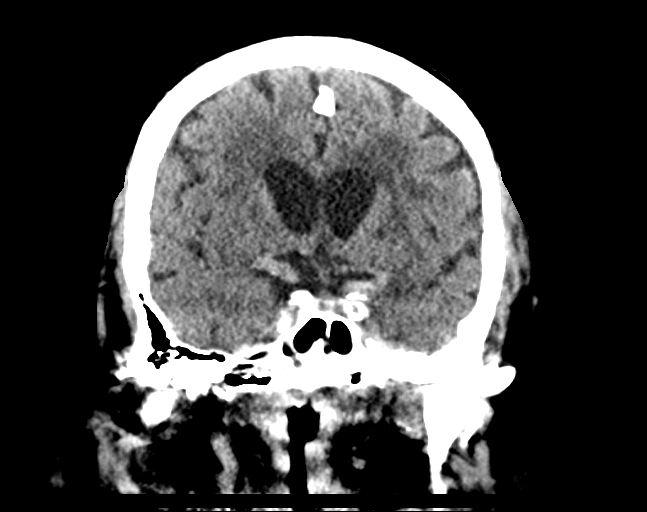
[im 50/90  brain]
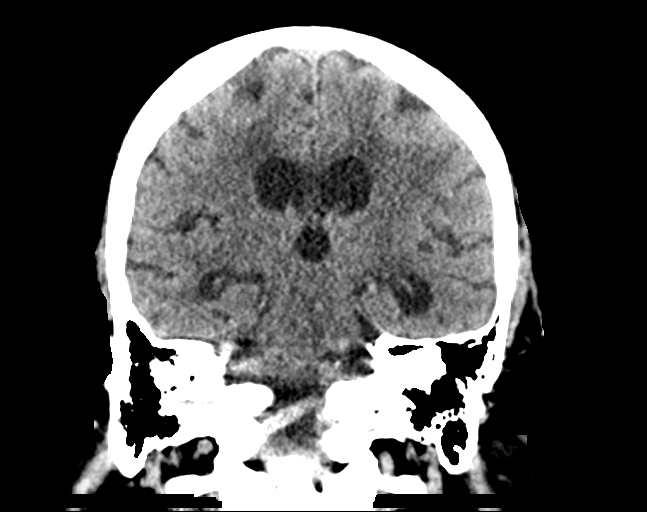

[Series 5: head without sag · sagittal · non-contrast · 0.35mm/px · 3 of 67 slices shown]
[im 23/67  brain]
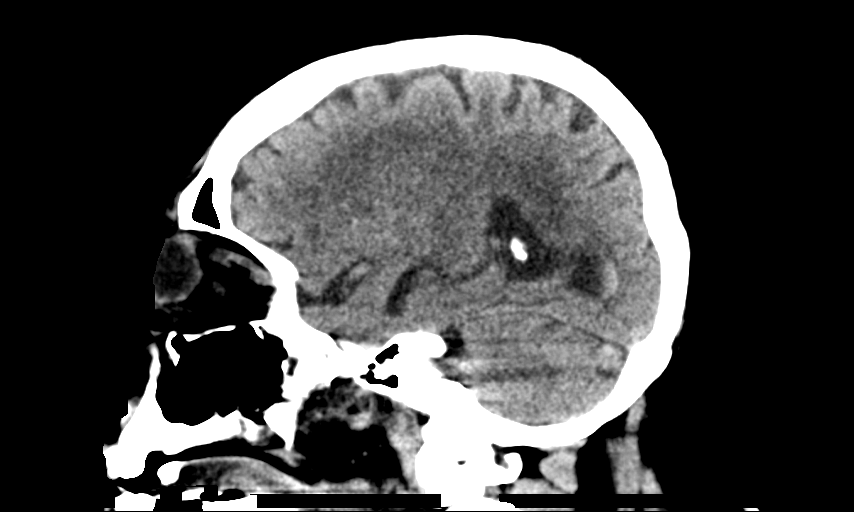
[im 34/67  brain]
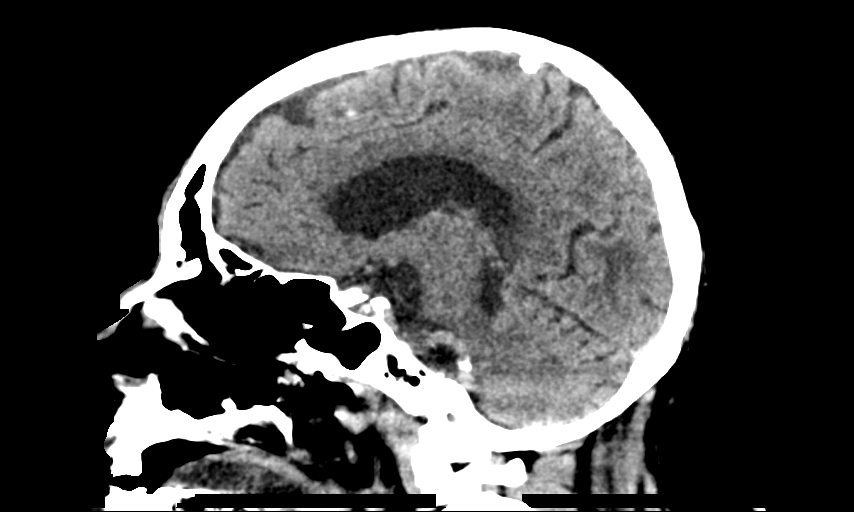
[im 45/67  brain]
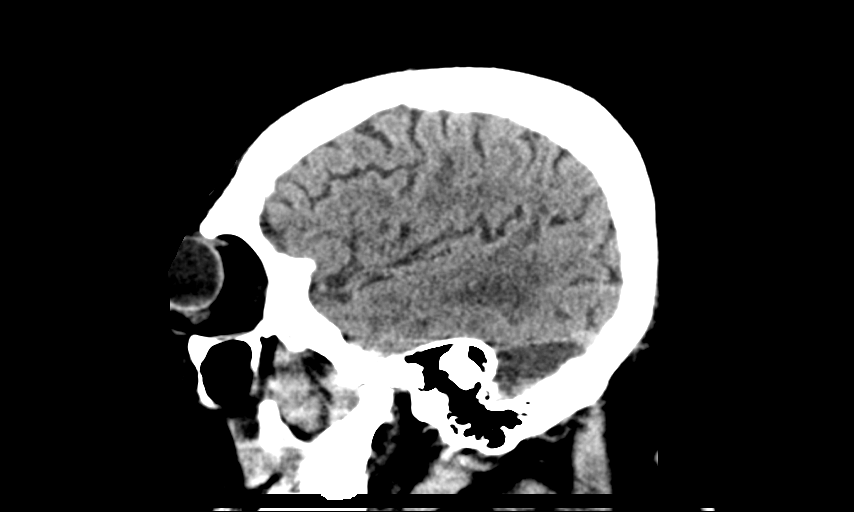

[15 of 47 positions shown; findings below may reference images not displayed]

FINDINGS: Brain: There is no mass, hemorrhage or extra-axial collection. There
is generalized atrophy without lobar predilection. Hypodensity of
the white matter is most commonly associated with chronic
microvascular disease. There is blood layering within the occipital
horns of the lateral ventricles, unchanged.

Vascular: Atherosclerotic calcification of the vertebral and
internal carotid arteries at the skull base. No abnormal
hyperdensity of the major intracranial arteries or dural venous
sinuses.

Skull: The visualized skull base, calvarium and extracranial soft
tissues are normal.

Sinuses/Orbits: No fluid levels or advanced mucosal thickening of
the visualized paranasal sinuses. No mastoid or middle ear effusion.
The orbits are normal.
IMPRESSION: Unchanged intraventricular blood layering within the occipital horns
of the lateral ventricles.

## 2022-05-10 IMAGING — DX DG CHEST 1V PORT
1 series · 1 of 1 positions shown · non-contrast
Comparison: January 25, 2020

CLINICAL DATA: Shortness of breath

EXAM:
PORTABLE CHEST 1 VIEW

[chest ap]
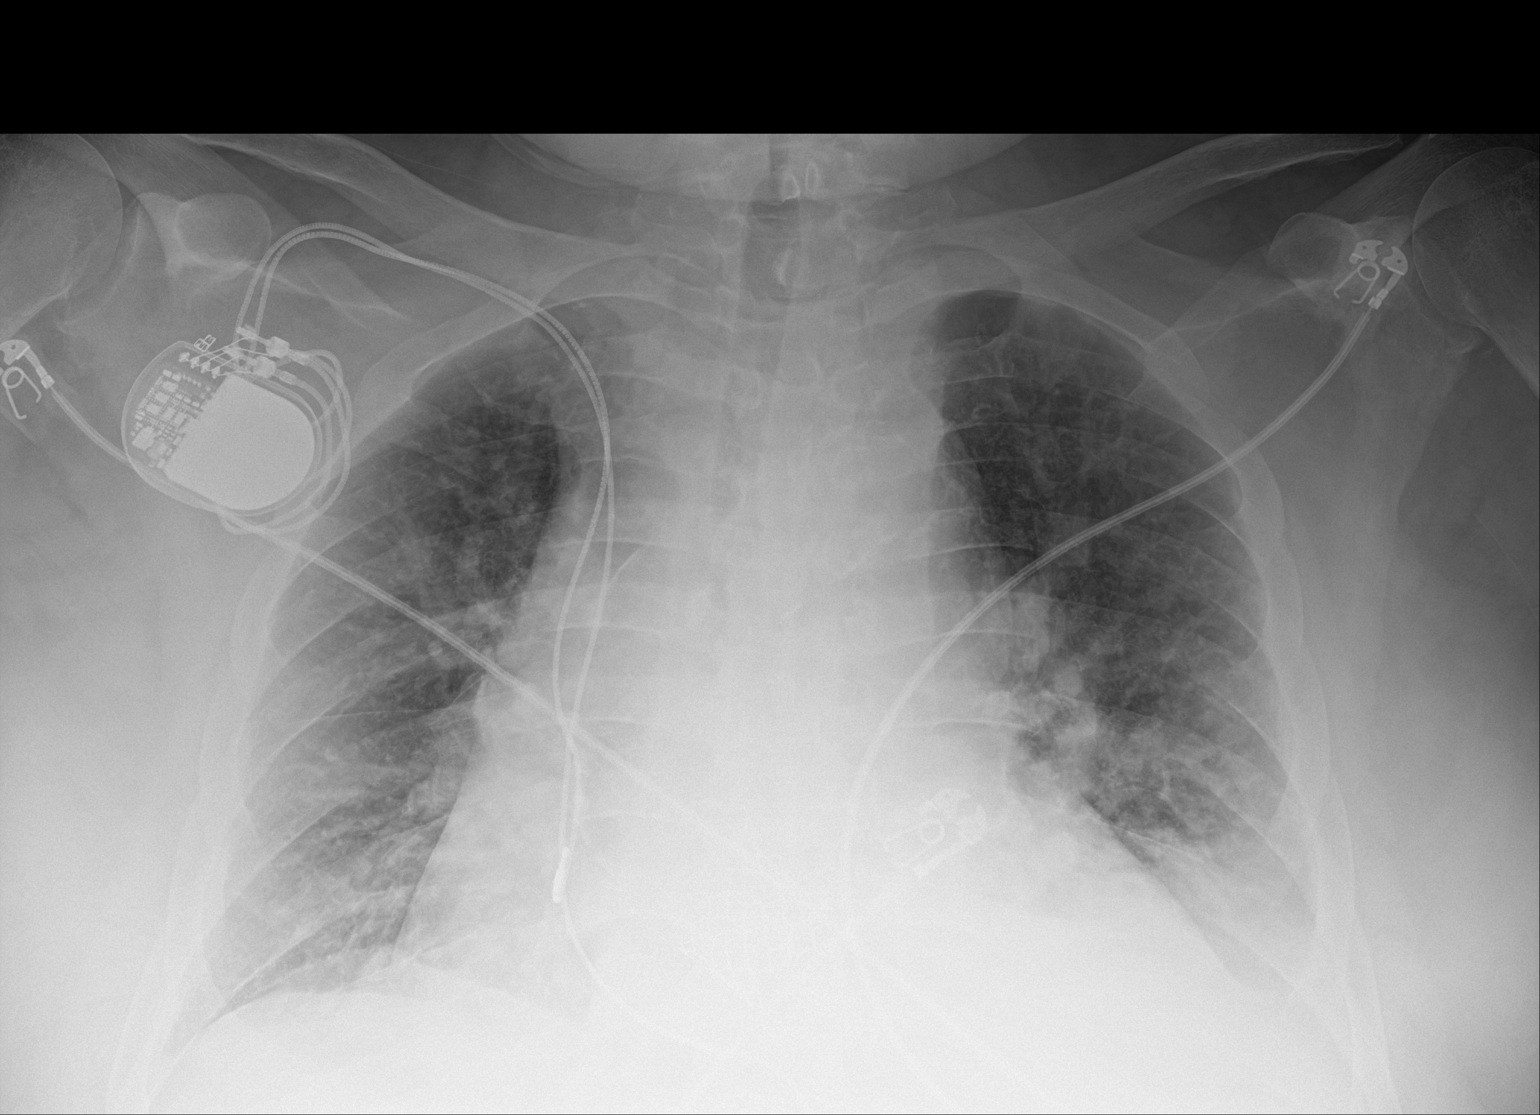

[1 of 1 positions shown; findings below may reference images not displayed]

FINDINGS: There is airspace opacity in the left base with small left pleural
effusion. The lungs elsewhere are clear. There is cardiomegaly with
pulmonary venous hypertension. Pacemaker leads are attached to the
right atrium and right ventricle. No adenopathy. No bone lesions.
IMPRESSION: Airspace opacity left base consistent with pneumonia. Associated
small left pleural effusion. No other areas of airspace opacity.

Cardiomegaly with pulmonary vascular congestion. Pacemaker leads
attached to right atrium and right ventricle.

## 2022-05-10 IMAGING — CT CT HEAD W/O CM
3 series · 15 of 47 positions shown, 18 images · non-contrast
Comparison: Head CT from yesterday

CLINICAL DATA: Stroke follow-up.  Increasing lethargy

EXAM:
CT HEAD WITHOUT CONTRAST
TECHNIQUE: Contiguous axial images were obtained from the base of the skull
through the vertex without intravenous contrast.

[Series 3: head 5.0 h30s · axial · 0.43mm/px · z∈[+1111,+1251]mm · 9 of 34 slices shown, 12 images]
[im 3/34  brain]
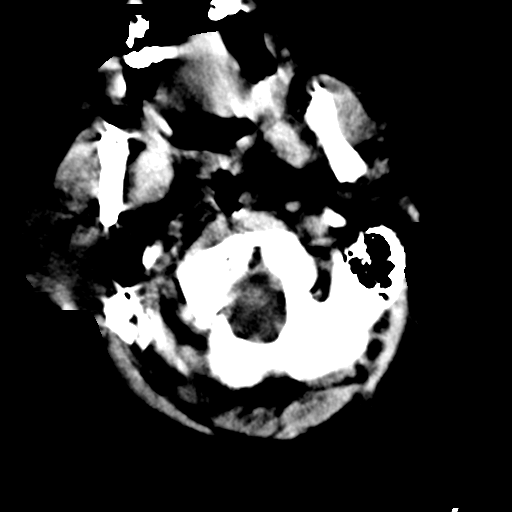
[im 3/34  bone]
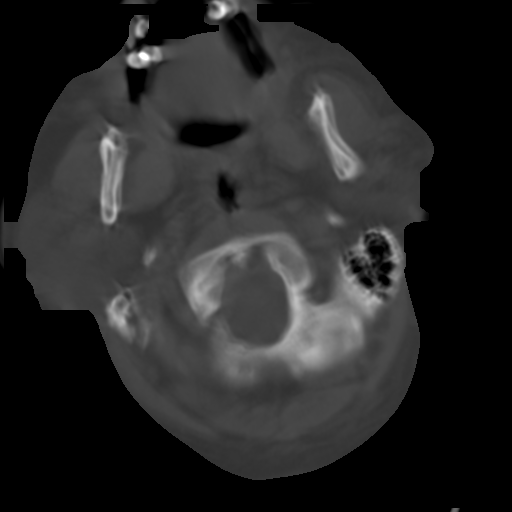
[im 6/34  brain]
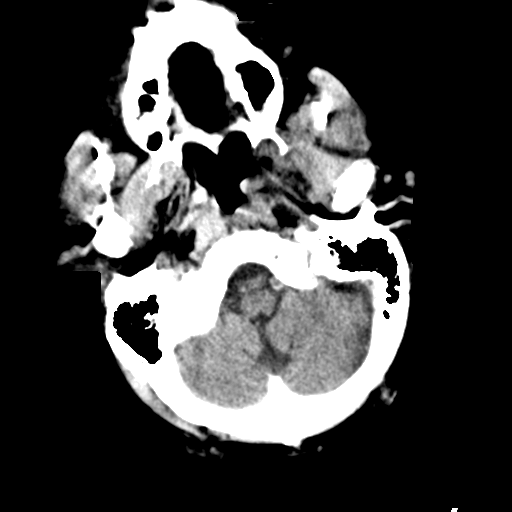
[im 10/34  brain]
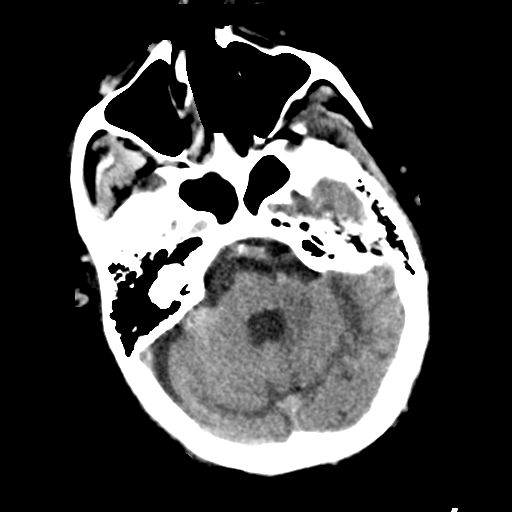
[im 13/34  brain]
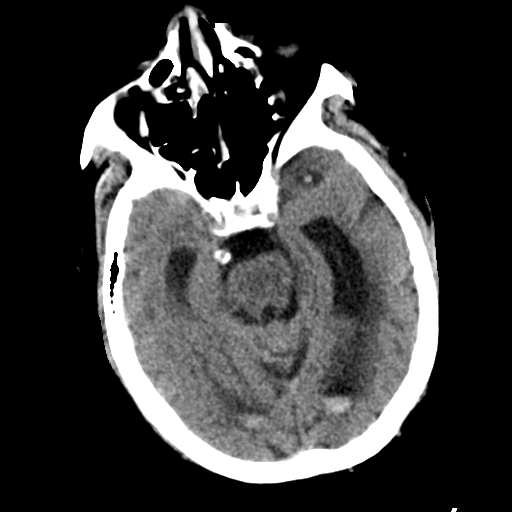
[im 18/34  brain]
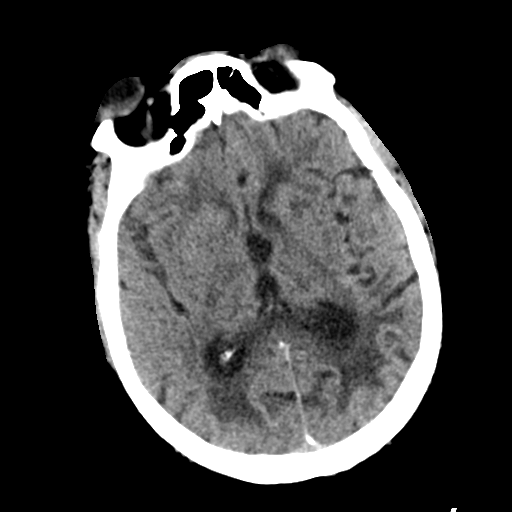
[im 18/34  bone]
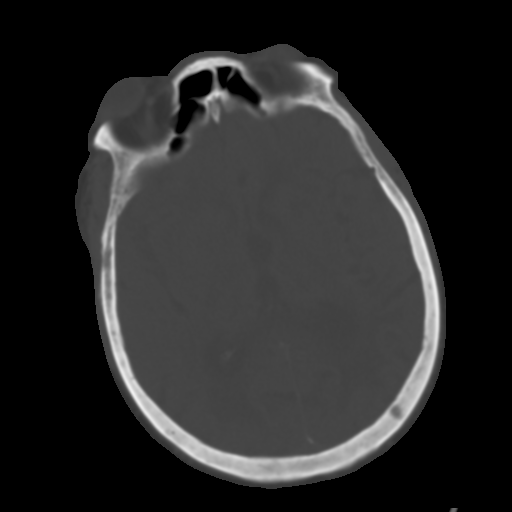
[im 21/34  brain]
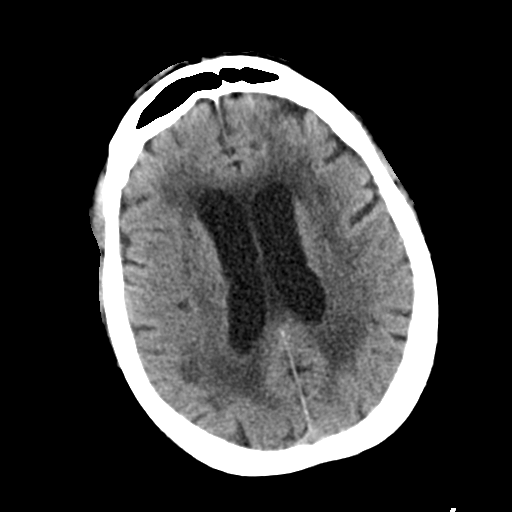
[im 24/34  brain]
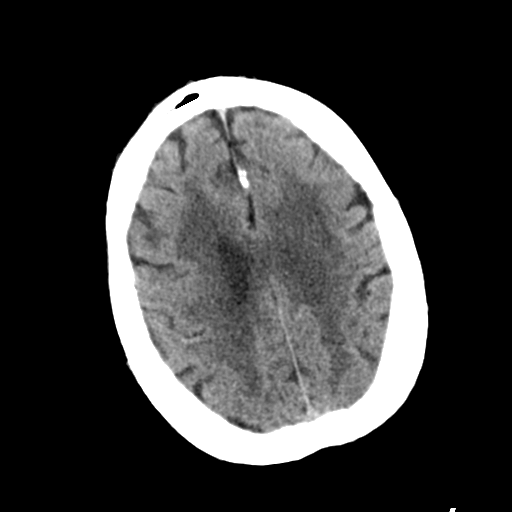
[im 28/34  brain]
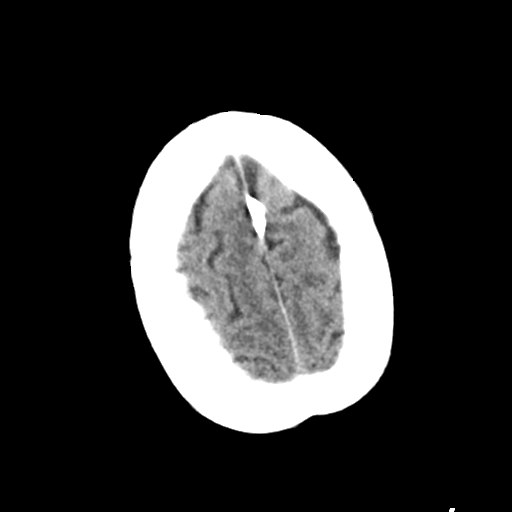
[im 31/34  brain]
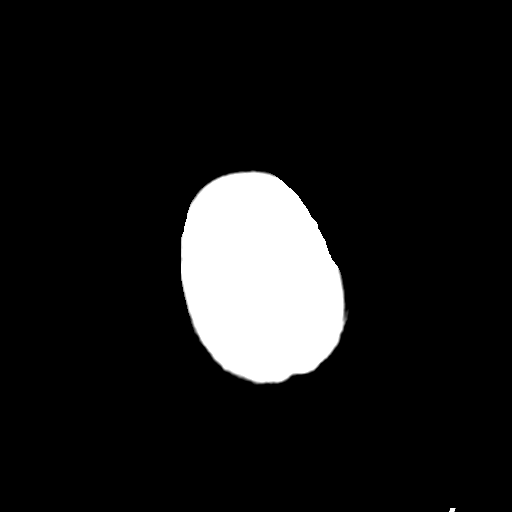
[im 31/34  bone]
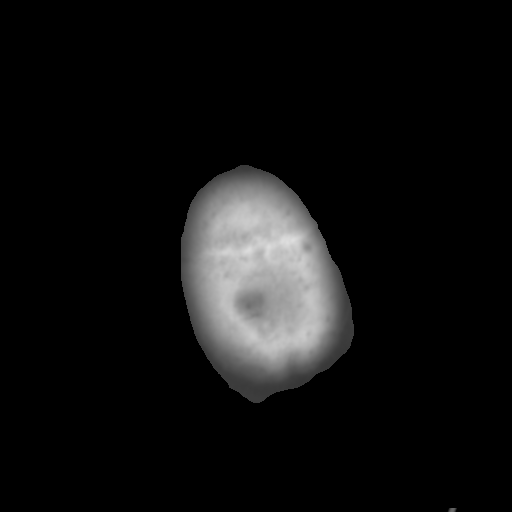

[Series 5: head 3.0 mpr cor · coronal · 0.32mm/px · 3 of 67 slices shown]
[im 23/67  brain]
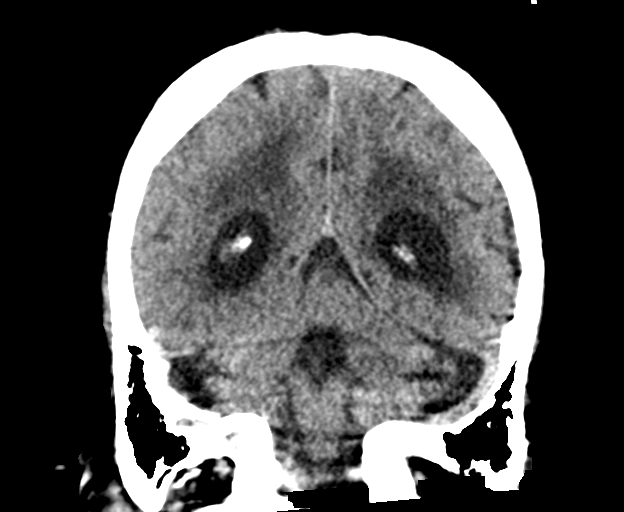
[im 30/67  brain]
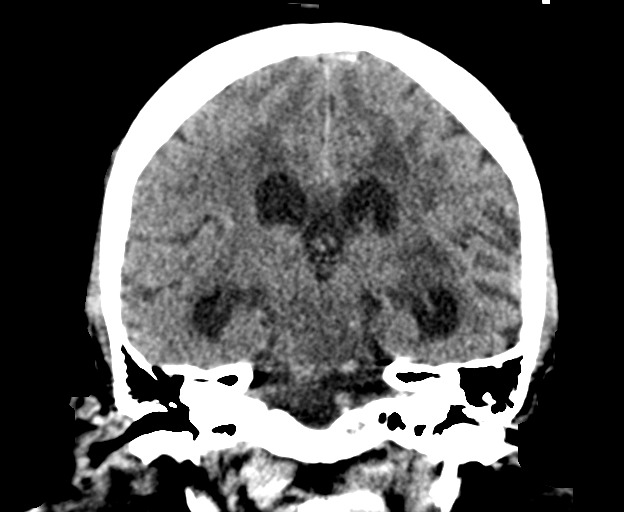
[im 37/67  brain]
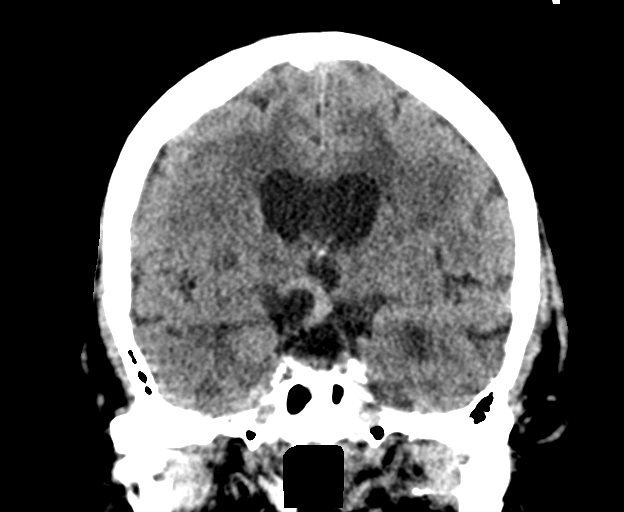

[Series 6: head 3.0 mpr sag · sagittal · 0.29mm/px · 3 of 67 slices shown]
[im 23/67  brain]
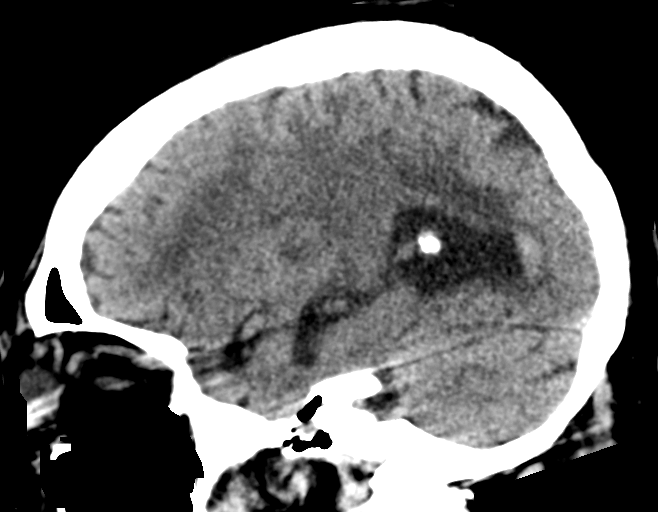
[im 34/67  brain]
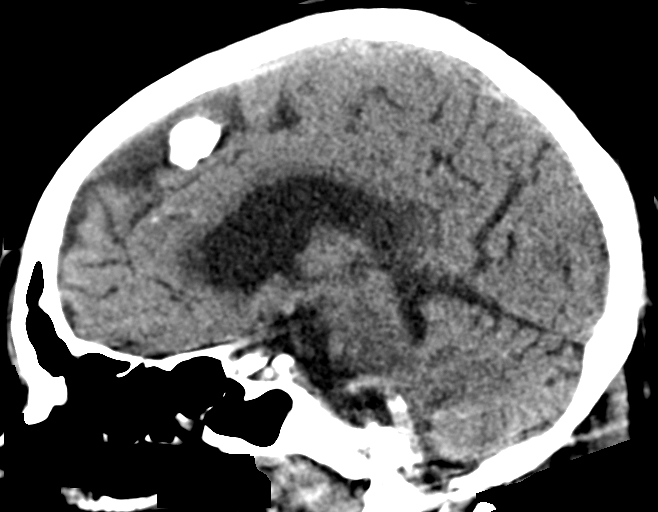
[im 45/67  brain]
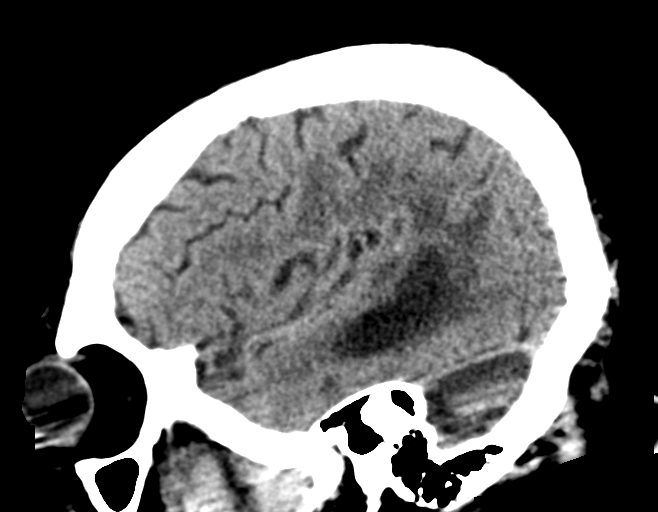

[15 of 47 positions shown; findings below may reference images not displayed]

FINDINGS: Brain: Small volume intraventricular hemorrhage layering in the
occipital horns of the lateral ventricles, mildly increased in
thickness. Cortical high-density at an old infarct along the
posterior and lateral left temporal lobe appears to reflect
mineralization based on a 3838 comparison. Extensive chronic small
vessel ischemia in the deep white matter. No visible gray matter
infarction, mass, or extra-axial collection. There is lateral
ventriculomegaly which measures mildly increased at the temporal
horns but overall is largely stable.

Vascular: Atherosclerotic calcification that is extensive

Skull: No acute or aggressive finding

Sinuses/Orbits: No acute finding
IMPRESSION: 1. Increased but still small volume intraventricular hemorrhage
layering in the occipital horns of the lateral ventricles.
2. Borderline increase in ventriculomegaly, recommend follow-up.

## 2022-05-11 IMAGING — CT CT HEAD W/O CM
4 series · 16 of 47 positions shown, 18 images · non-contrast
Comparison: January 27, 2020

CLINICAL DATA: Recent cerebral hemorrhage

EXAM:
CT HEAD WITHOUT CONTRAST
TECHNIQUE: Contiguous axial images were obtained from the base of the skull
through the vertex without intravenous contrast.

[Series 3: head bone · axial · 0.49mm/px · z∈[-156,-120]mm · 3 of 93 slices shown]
[im 10/93  bone]
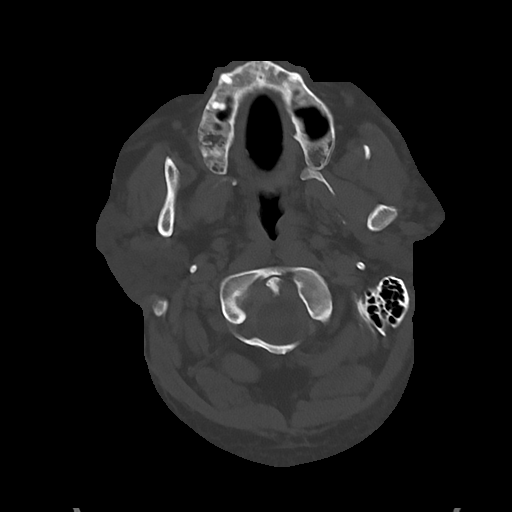
[im 19/93  bone]
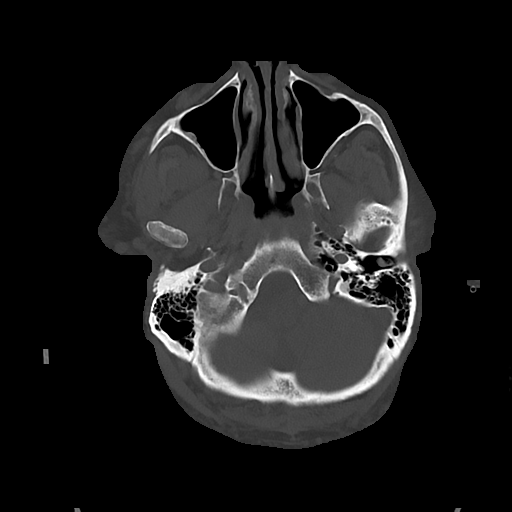
[im 28/93  bone]
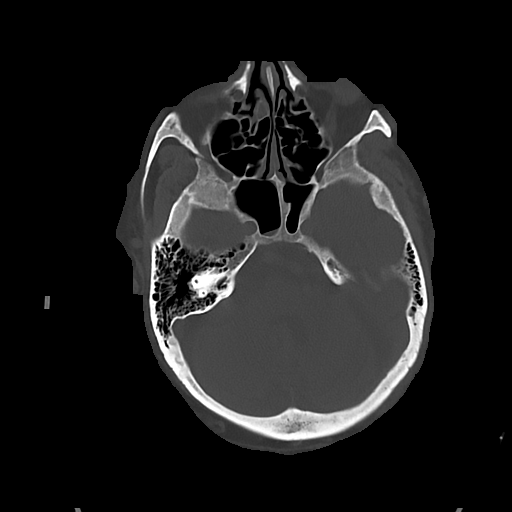

[Series 4: head wo · axial · 0.49mm/px · z∈[-154,-19]mm · 7 of 37 slices shown, 9 images]
[im 5/37  brain]
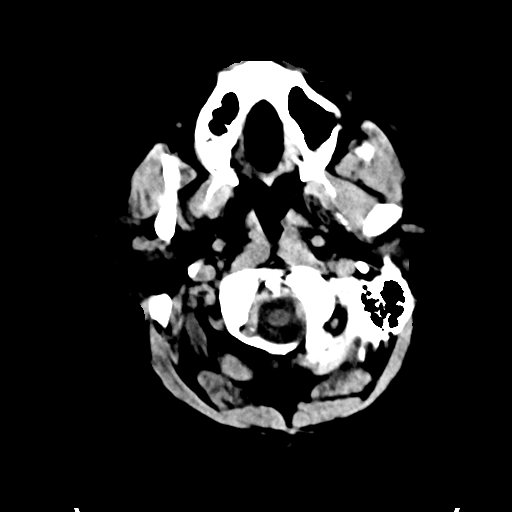
[im 5/37  bone]
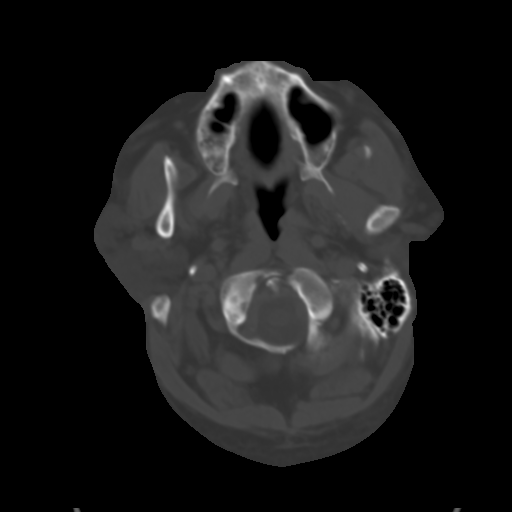
[im 10/37  brain]
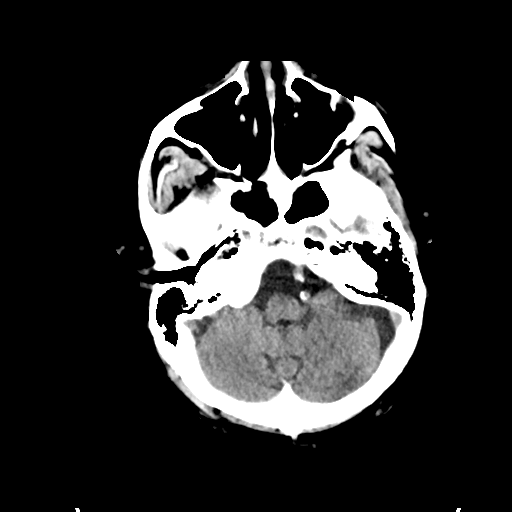
[im 14/37  brain]
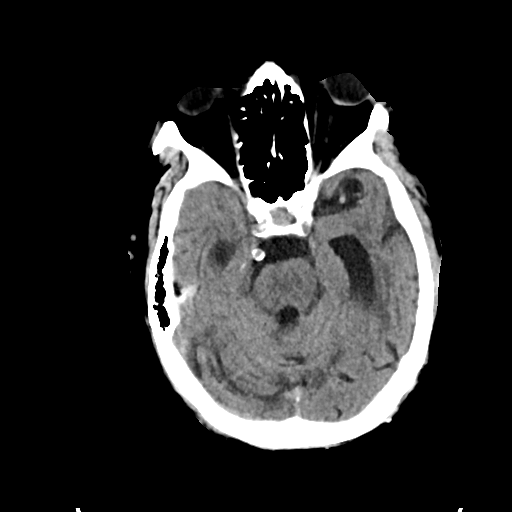
[im 19/37  brain]
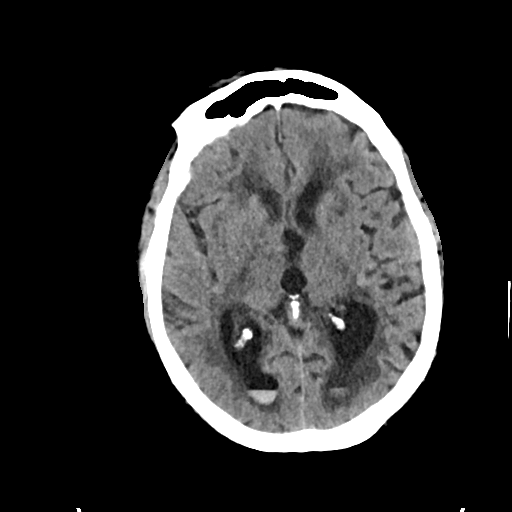
[im 23/37  brain]
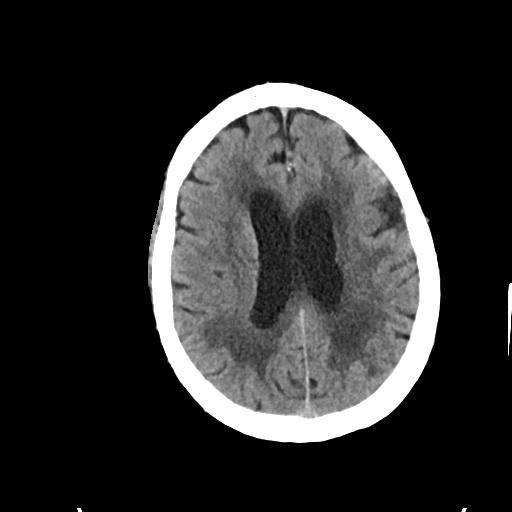
[im 23/37  bone]
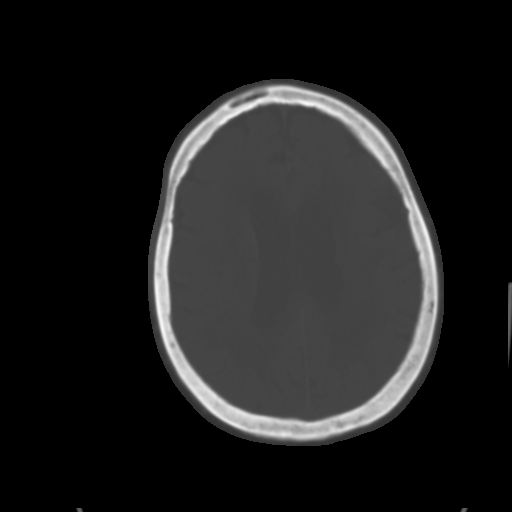
[im 28/37  brain]
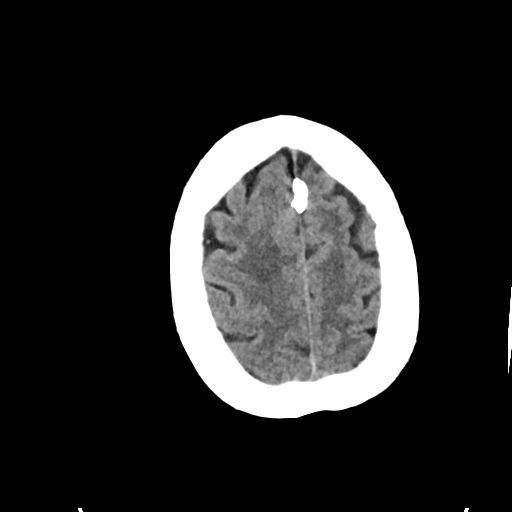
[im 32/37  brain]
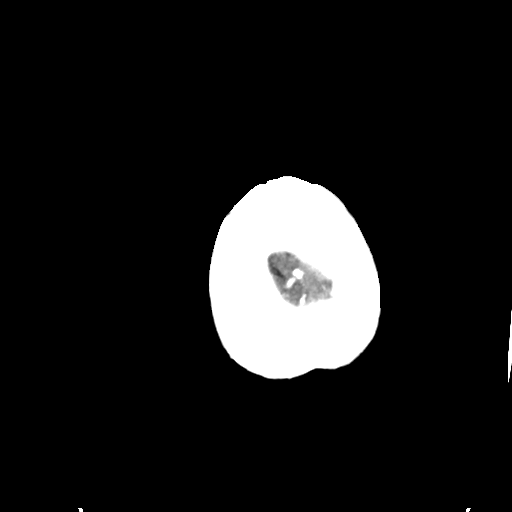

[Series 5: cor soft · coronal · 0.36mm/px · 3 of 77 slices shown]
[im 26/77  brain]
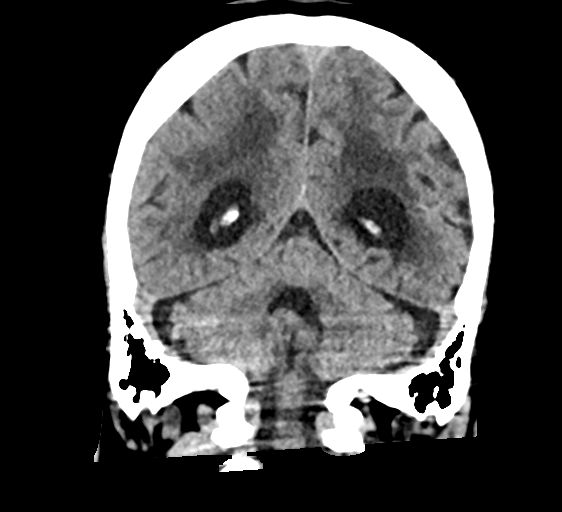
[im 34/77  brain]
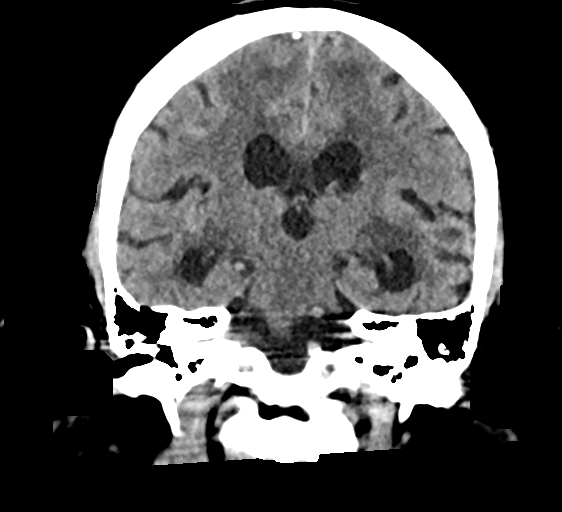
[im 43/77  brain]
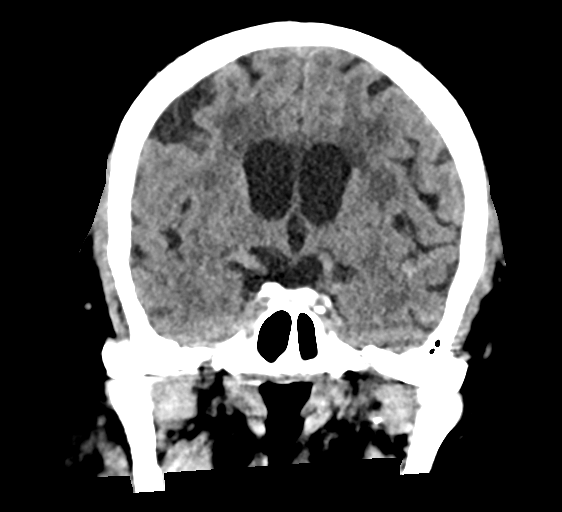

[Series 6: sag soft · sagittal · 0.36mm/px · 3 of 68 slices shown]
[im 23/68  brain]
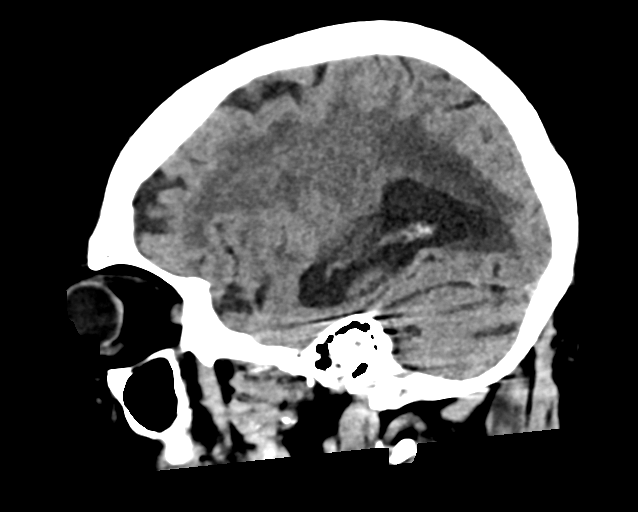
[im 34/68  brain]
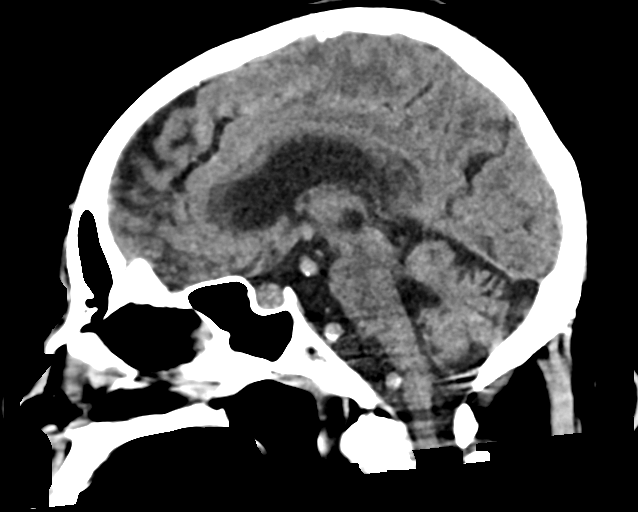
[im 45/68  brain]
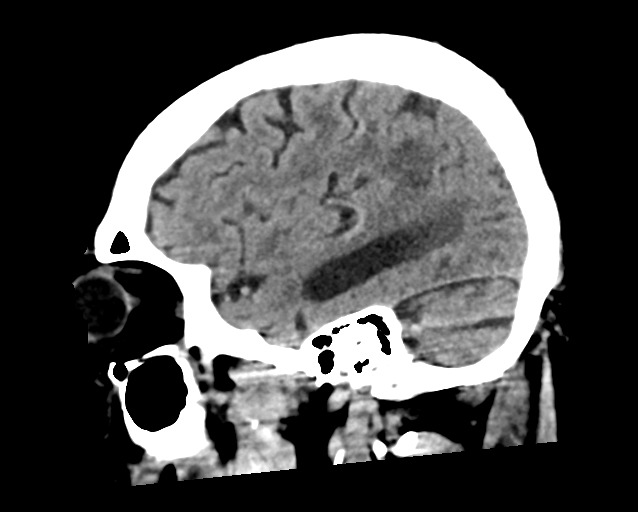

[16 of 47 positions shown; findings below may reference images not displayed]

FINDINGS: Brain: Moderate diffuse atrophy is stable. There is again noted
layering hemorrhage in the atria the lateral ventricles bilaterally
stable. No new foci of hemorrhage evident. There is no mass,
extra-axial fluid collection, or midline shift. Increased
attenuation in the posterior left temporal lobe is stable and likely
reflects mineralization as noted in recent report. There is patchy
small vessel disease in the centra semiovale bilaterally, stable. No
acute appearing infarct evident.

Vascular: Hyperdense vessel. There is calcification in vertebral
arteries as well as in basilar artery carotid siphon regions
bilaterally.

Skull: Bony calvarium appears.

Sinuses/Orbits: There is mucosal thickening in multiple ethmoid air
cells. Other paranasal sinuses are clear. Orbits appear symmetric.

Other: Mastoid air cells are clear.
IMPRESSION: Stable hemorrhage layering in each occipital horn of the lateral
ventricles, stable. No hemorrhage appreciable. There is atrophy with
extensive supratentorial small vessel disease, stable. No acute
infarct evident.

There is extensive arterial vascular calcification at multiple
sites. There is mucosal thickening in multiple ethmoid air cells.

## 2022-05-12 IMAGING — DX DG CHEST 1V PORT
1 series · 1 of 1 positions shown · non-contrast
Comparison: January 27, 2020

CLINICAL DATA: Fever

EXAM:
PORTABLE CHEST 1 VIEW

[chest]
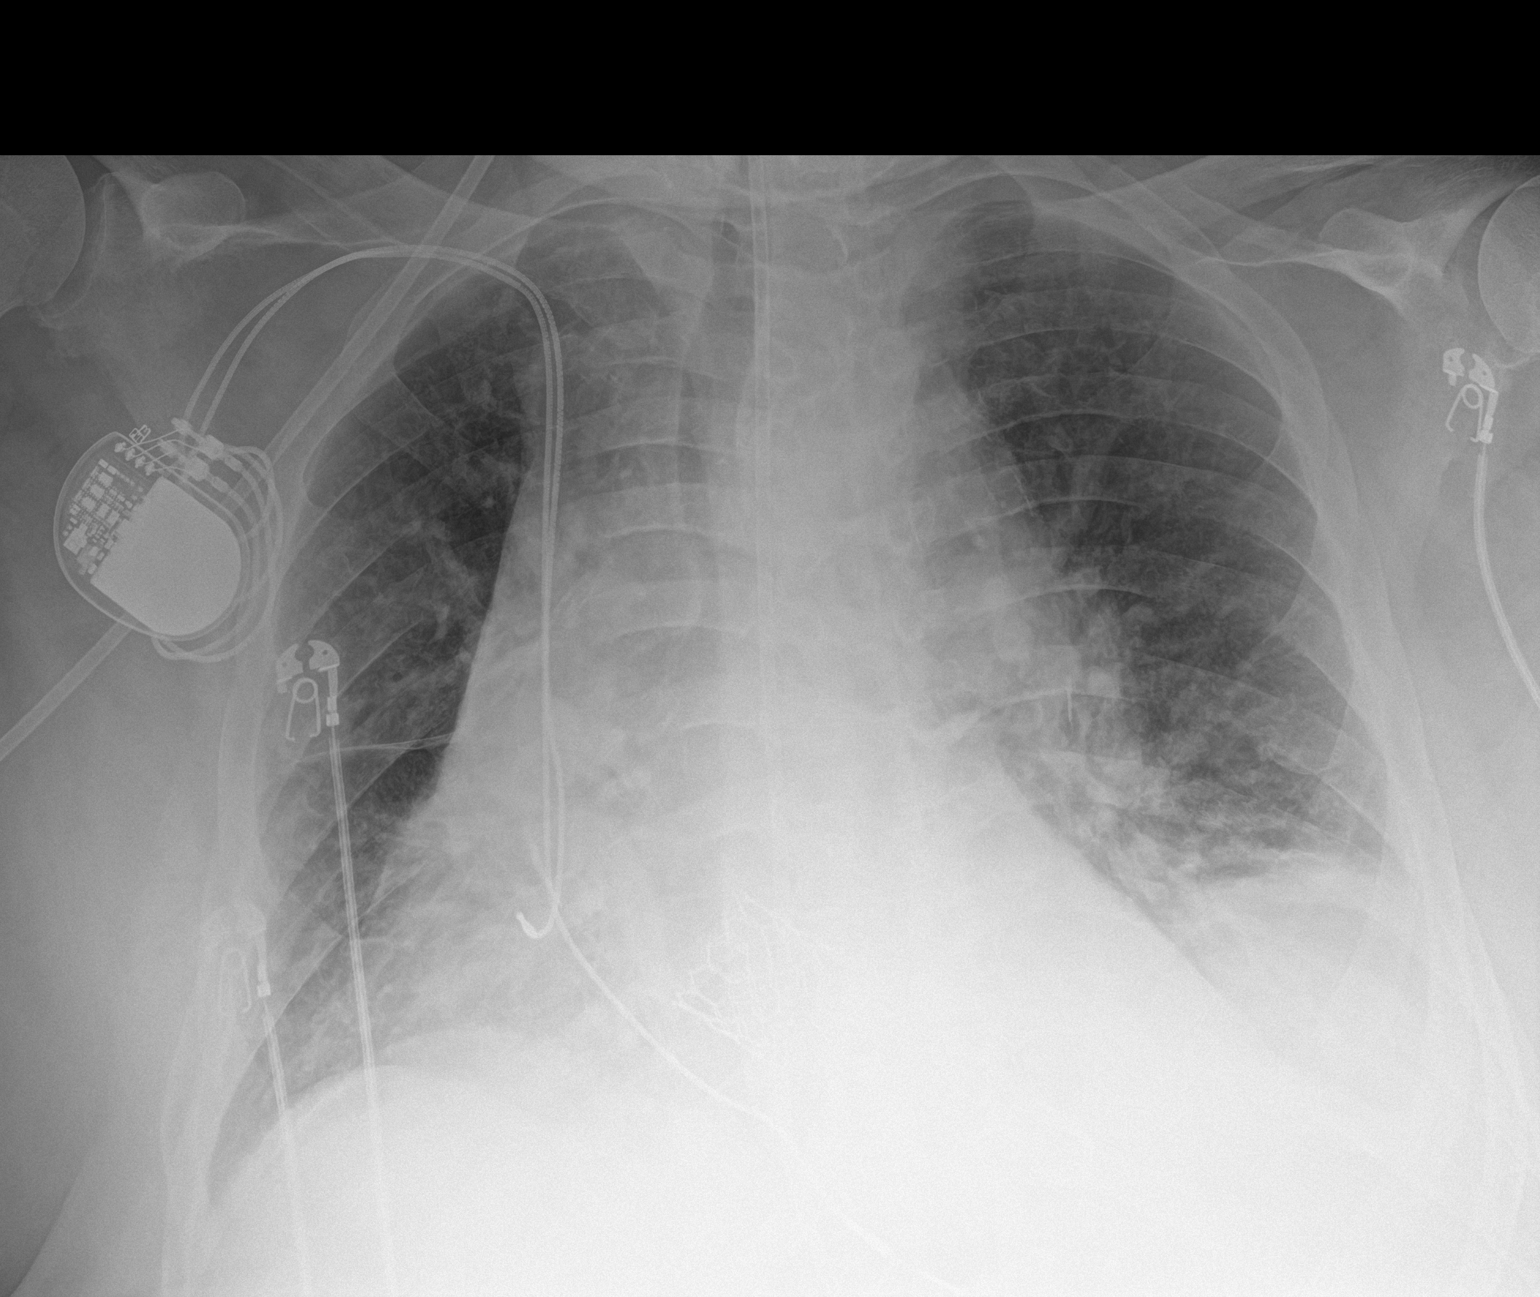

[1 of 1 positions shown; findings below may reference images not displayed]

FINDINGS: There is persistent airspace opacity in the left base with small
left pleural effusion. Lungs elsewhere are clear. Cardiomegaly with
pulmonary venous hypertension persists. There is an aortic valve
replacement. Pacemaker leads attached to right atrium and right
ventricle. Feeding tube tip is below the diaphragm. No adenopathy.
No bone lesions
IMPRESSION: Persistent apparent pneumonia left base with small left pleural
effusion. No new opacity.

Stable cardiomegaly with pulmonary vascular congestion.

Stable postoperative changes. No adenopathy. Feeding tube tip below
diaphragm.

## 2022-05-13 IMAGING — CT CT HEAD W/O CM
4 series · 16 of 47 positions shown, 18 images · non-contrast
Comparison: CT head 01/28/2020, 01/25/2020

CLINICAL DATA: Cerebral hemorrhage follow-up

EXAM:
CT HEAD WITHOUT CONTRAST
TECHNIQUE: Contiguous axial images were obtained from the base of the skull
through the vertex without intravenous contrast.

[Series 3: head wo · axial · 0.41mm/px · z∈[-129,-9]mm · 7 of 33 slices shown, 9 images]
[im 5/33  brain]
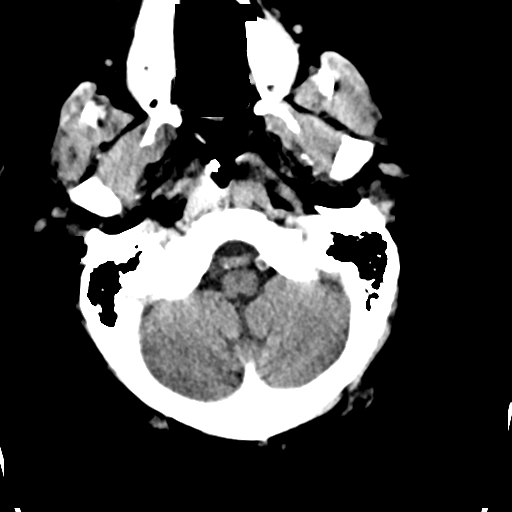
[im 5/33  bone]
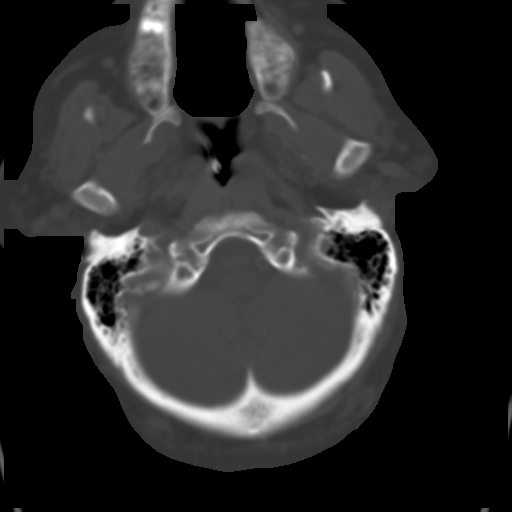
[im 9/33  brain]
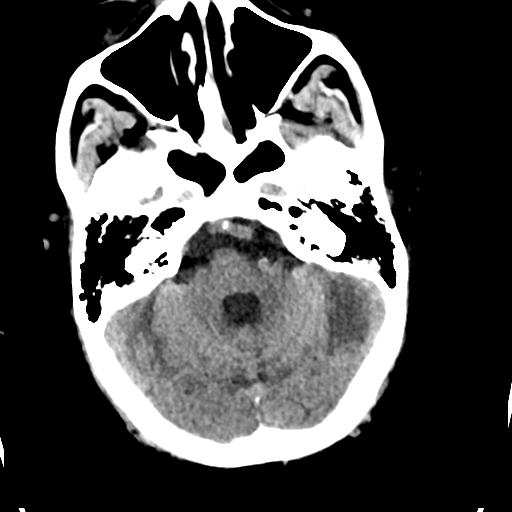
[im 13/33  brain]
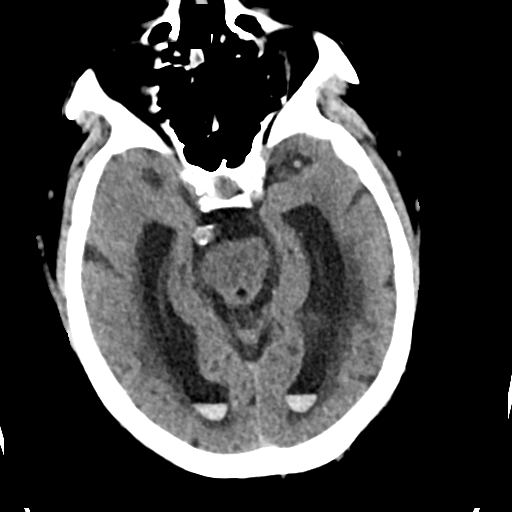
[im 17/33  brain]
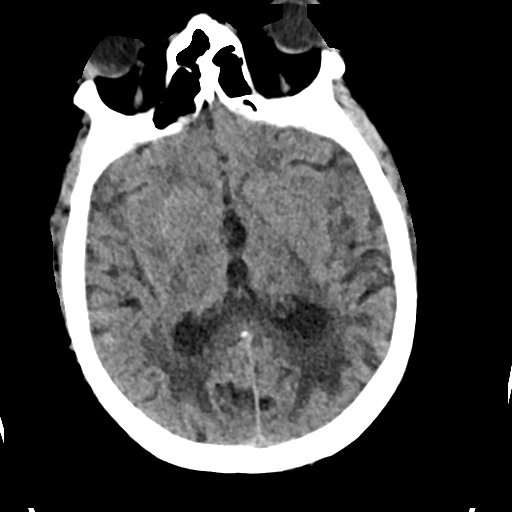
[im 21/33  brain]
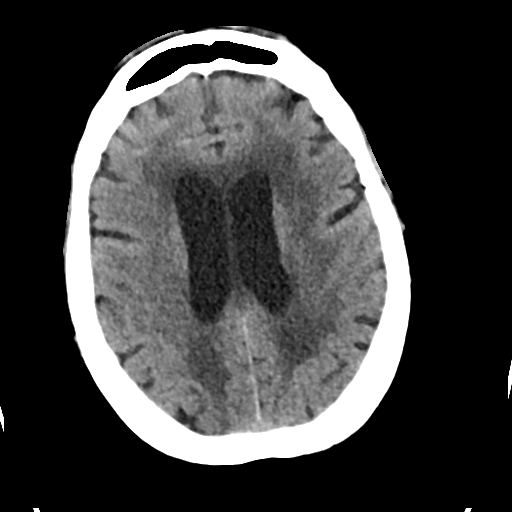
[im 21/33  bone]
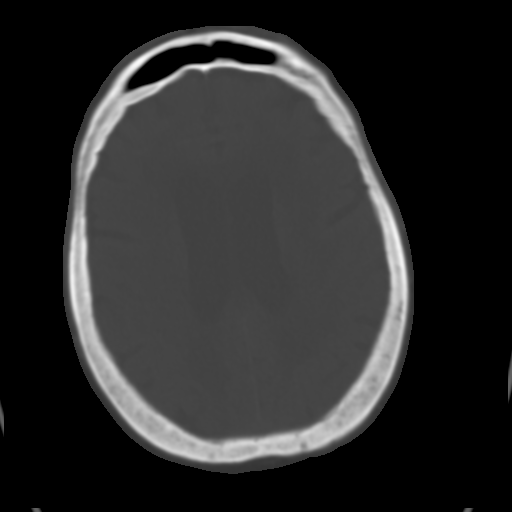
[im 25/33  brain]
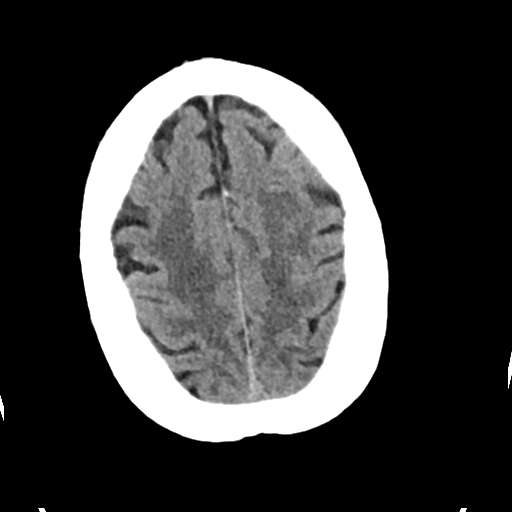
[im 29/33  brain]
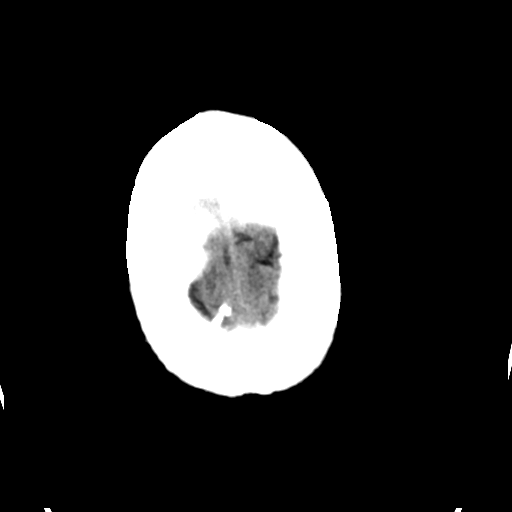

[Series 4: head bone · axial · 0.41mm/px · z∈[-133,-101]mm · 3 of 83 slices shown]
[im 9/83  bone]
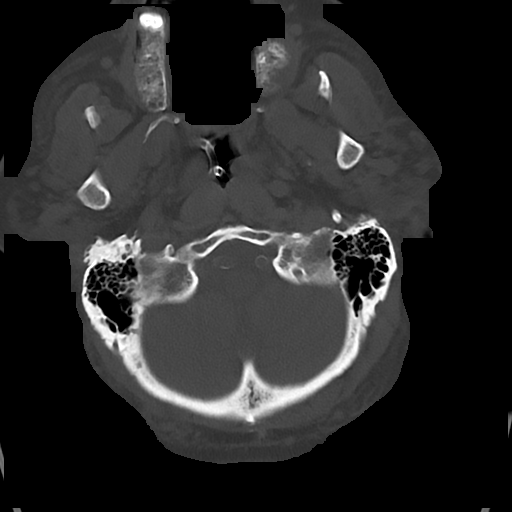
[im 17/83  bone]
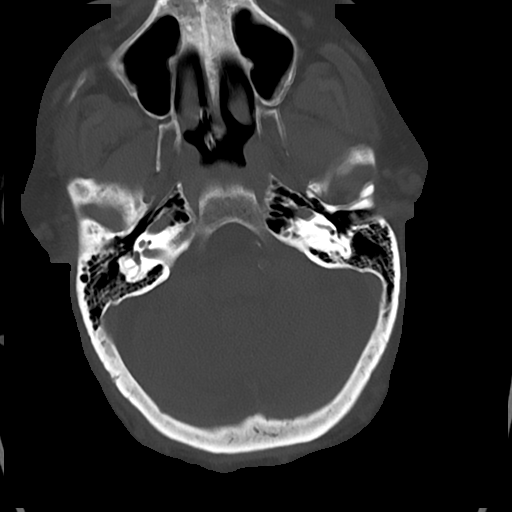
[im 25/83  bone]
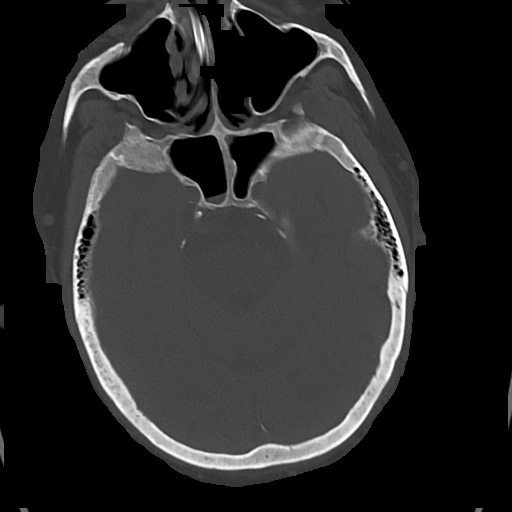

[Series 5: cor soft · coronal · 0.31mm/px · 3 of 72 slices shown]
[im 24/72  brain]
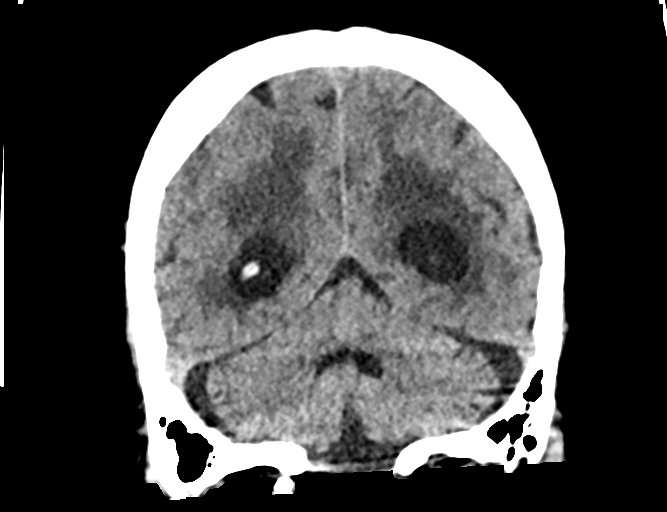
[im 32/72  brain]
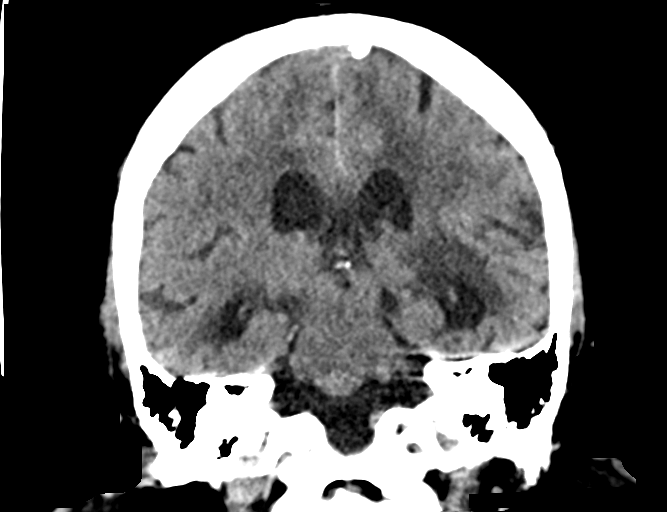
[im 40/72  brain]
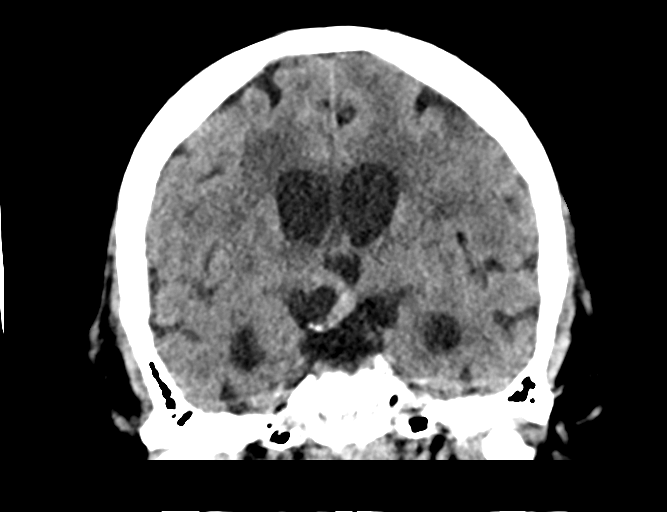

[Series 6: sag soft · sagittal · 0.36mm/px · 3 of 69 slices shown]
[im 23/69  brain]
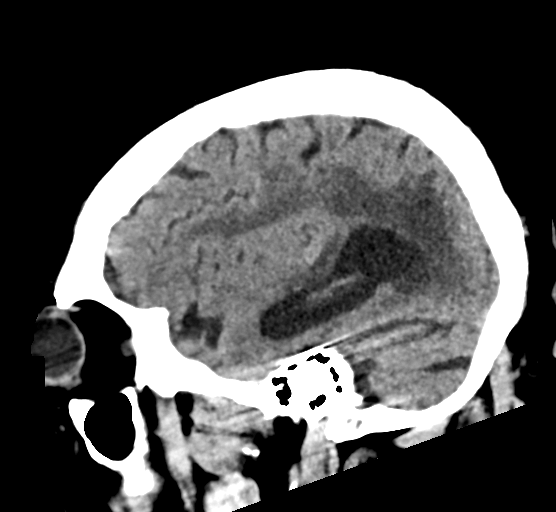
[im 35/69  brain]
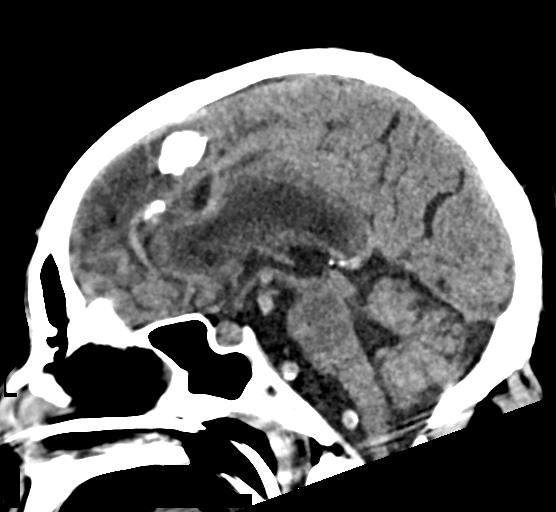
[im 46/69  brain]
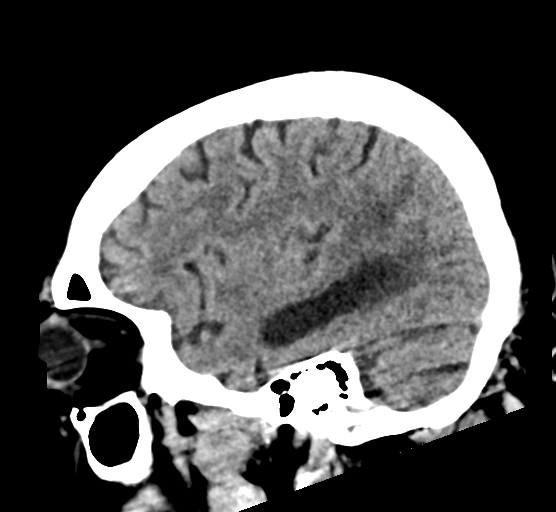

[16 of 47 positions shown; findings below may reference images not displayed]

FINDINGS: Brain: Layering blood in the occipital horns bilaterally unchanged.
Small amount of subarachnoid hemorrhage is present on the left,
unchanged from the prior study. This is likely circulation of the
intraventricular hemorrhage. No new hemorrhage.

Generalized atrophy. Mild ventricular enlargement is stable.
Extensive white matter disease diffusely is stable.

Negative for acute infarct or mass.  No new hemorrhage.

Vascular: The negative for hyperdense vessel.

Skull: Negative

Sinuses/Orbits: . Small air-fluid mild mucosal edema paranasal level
right sinuses sphenoid sinus.

Bilateral cataract extraction.  No orbital mass or edema.

Other: None
IMPRESSION: Stable intraventricular hemorrhage. Small amount of subarachnoid
hemorrhage on the left also stable. No new hemorrhage or infarct

Mild ventricular enlargement is stable.

## 2022-05-13 IMAGING — DX DG CHEST 1V PORT
1 series · 1 of 1 positions shown · non-contrast
Comparison: 01/30/2020 at [DATE] a.m.

CLINICAL DATA: Shortness of breath and confusion

EXAM:
PORTABLE CHEST 1 VIEW

[chest]
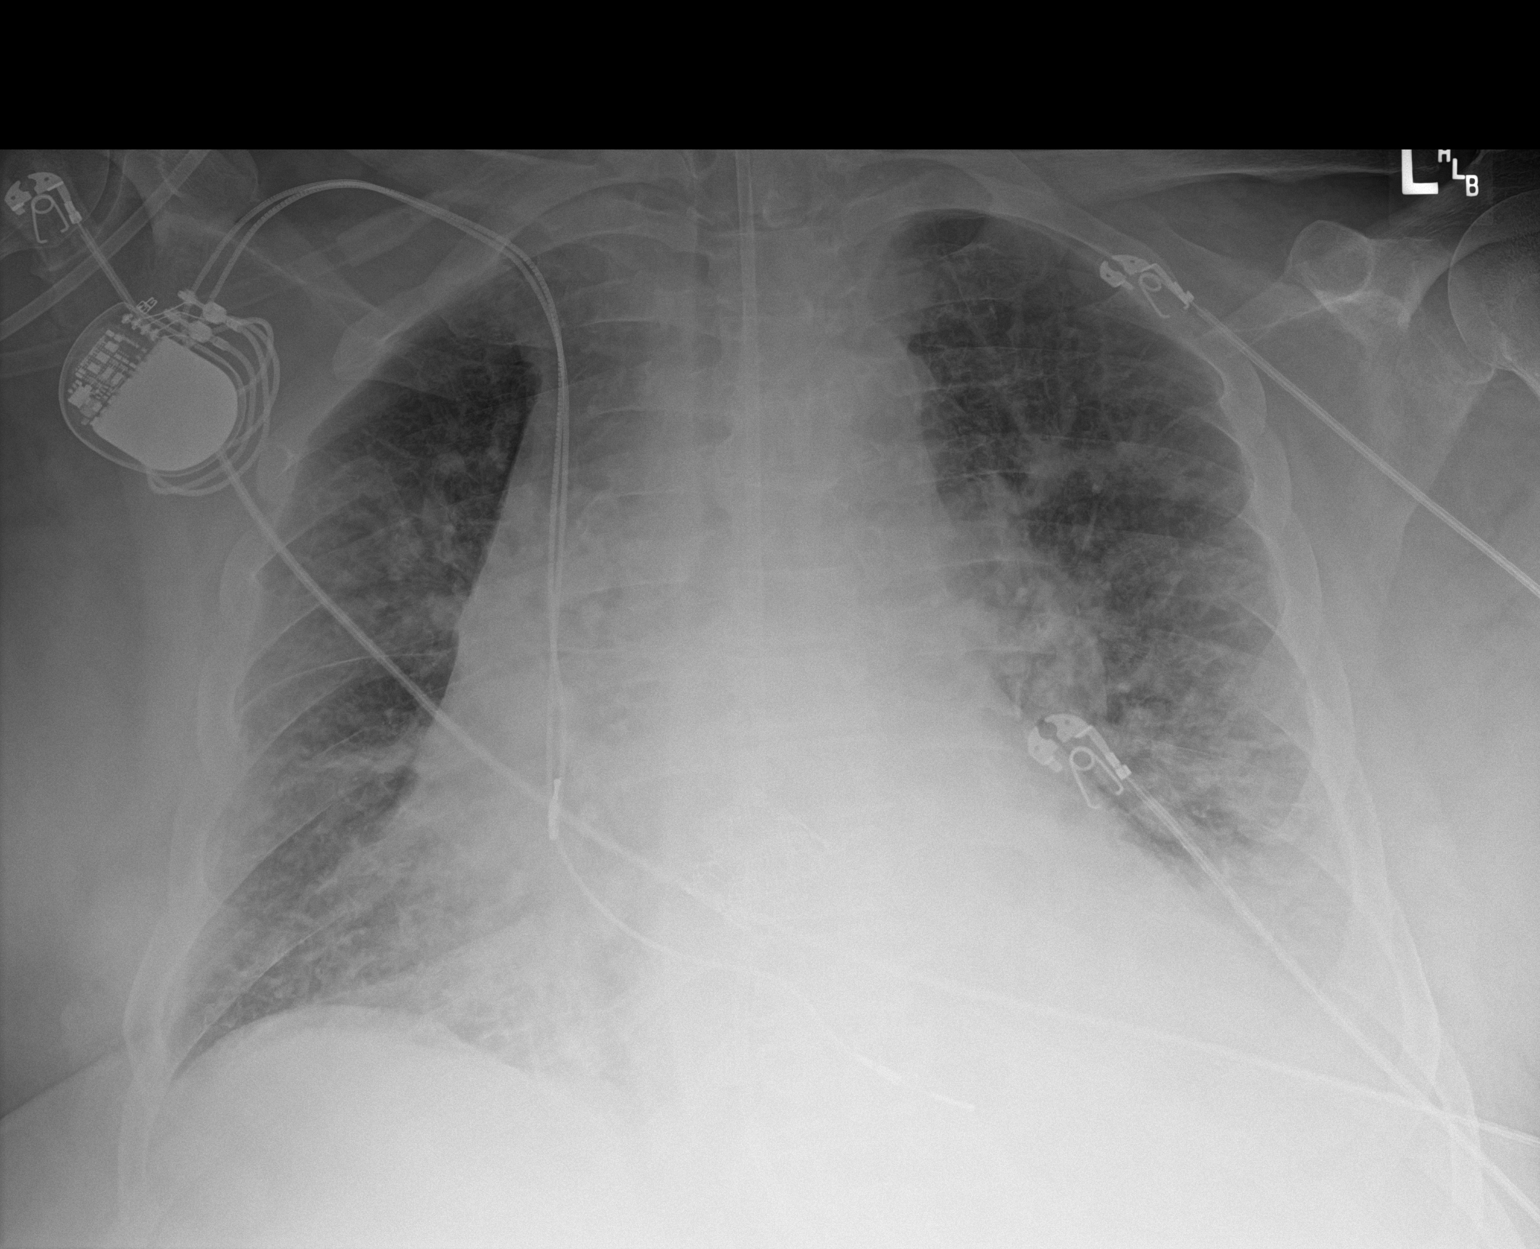

[1 of 1 positions shown; findings below may reference images not displayed]

FINDINGS: A feeding tube extends into the abdomen. Dual lead pacer remains in
place. Prosthetic aortic valve noted.

Moderate cardiomegaly. Fullness of the hila, which could be
vascular. The patient is rotated to the right on today's radiograph,
reducing diagnostic sensitivity and specificity.

Increased vascular indistinctness compared to prior. Poor definition
of the left hemidiaphragm suggesting a left lower lobe airspace
opacity.
IMPRESSION: 1. Cardiomegaly with pulmonary venous hypertension.
2. Left lower lobe airspace opacity potentially from atelectasis or
pneumonia.
3. Fullness of the hila, which could be vascular but adenopathy is
not excluded.

## 2022-05-13 IMAGING — DX DG CHEST 1V PORT
1 series · 1 of 1 positions shown · non-contrast
Comparison: January 29, 2020.

CLINICAL DATA: Wheezing

EXAM:
PORTABLE CHEST 1 VIEW

[chest ap]
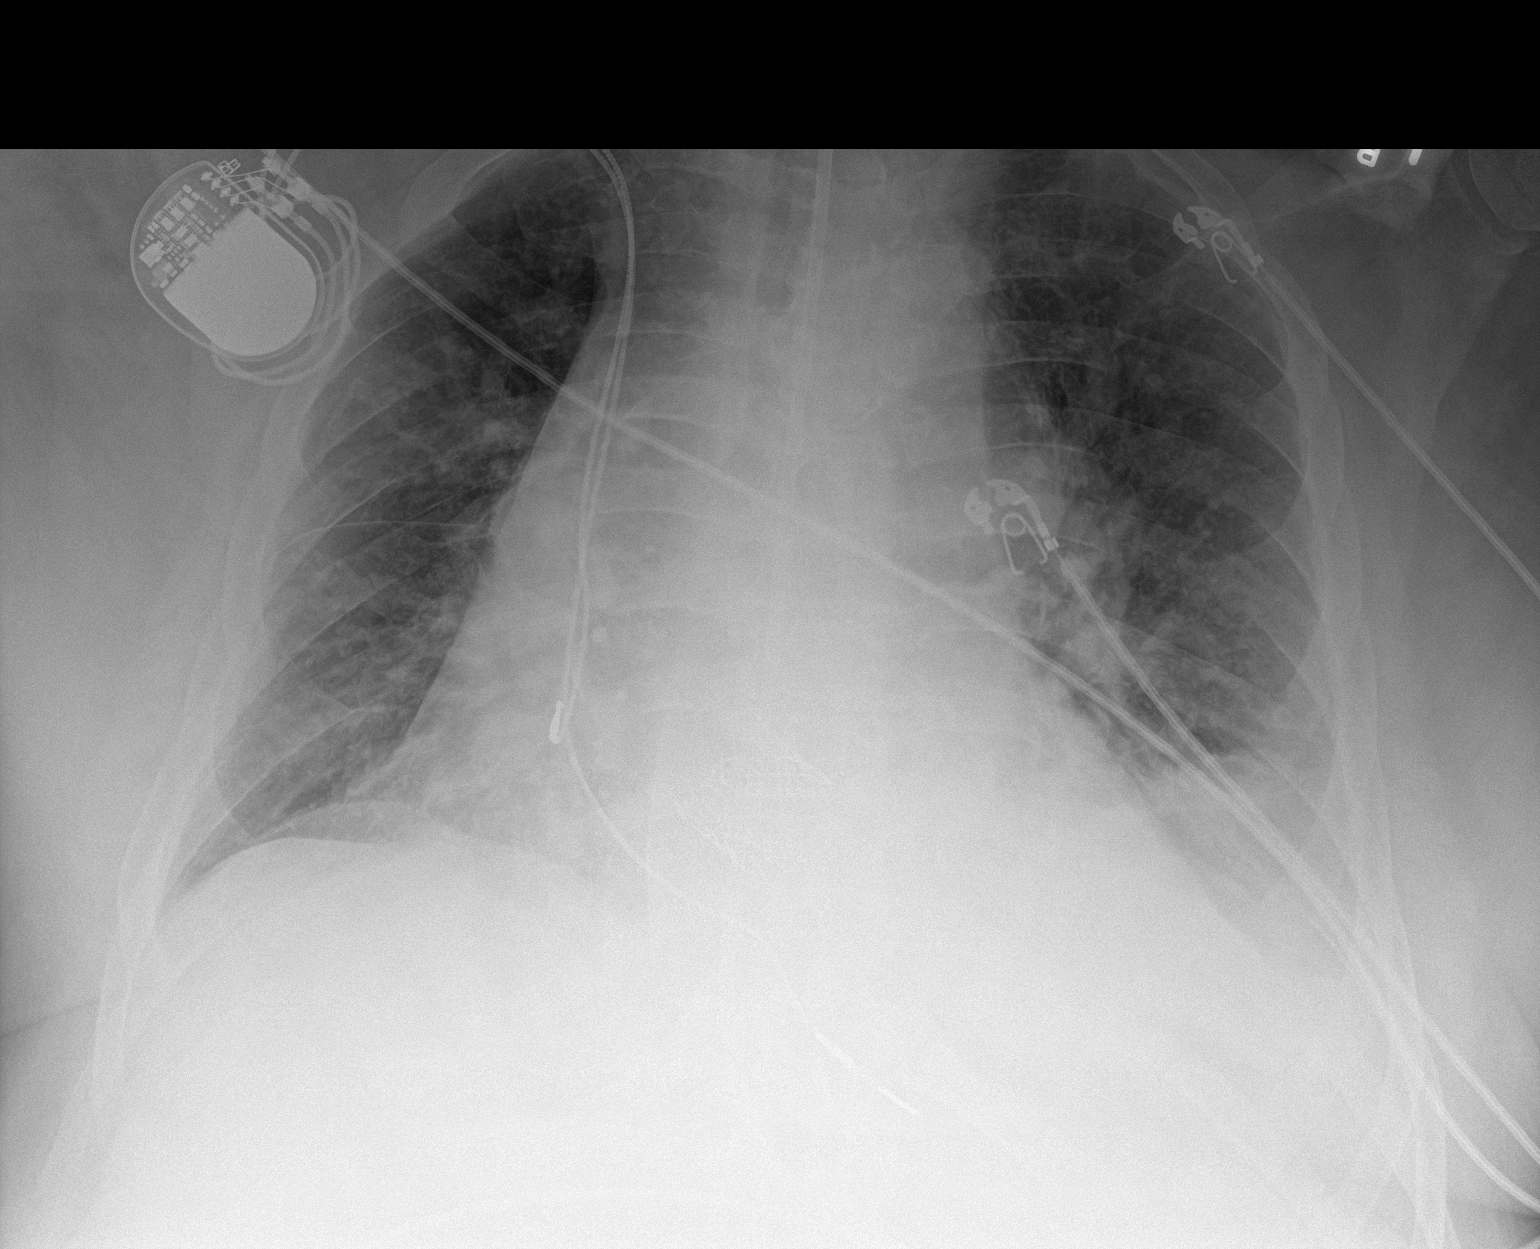

[1 of 1 positions shown; findings below may reference images not displayed]

FINDINGS: There is stable cardiomegaly with pulmonary venous hypertension.
Pacemaker leads are attached to the right atrium and right
ventricle. There is a prosthetic aortic valve. Feeding tube tip is
below the diaphragm.

There is a small left pleural effusion with left base atelectasis.
There is slight right base atelectasis. No consolidation evident.
IMPRESSION: Small left pleural effusion with bibasilar atelectasis. No airspace
opacity evident.

Stable cardiomegaly with pulmonary vascular congestion. No
adenopathy evident. Postoperative changes noted. Feeding tube tip
below diaphragm.

## 2022-05-14 IMAGING — US US RENAL
1 series · 14 of 25 positions shown · non-contrast
Comparison: 08/28/2013

CLINICAL DATA: Acute kidney injury.

EXAM:
RENAL / URINARY TRACT ULTRASOUND COMPLETE

[Series 1: us renal · 14 of 48 slices shown]
[im 1/48]
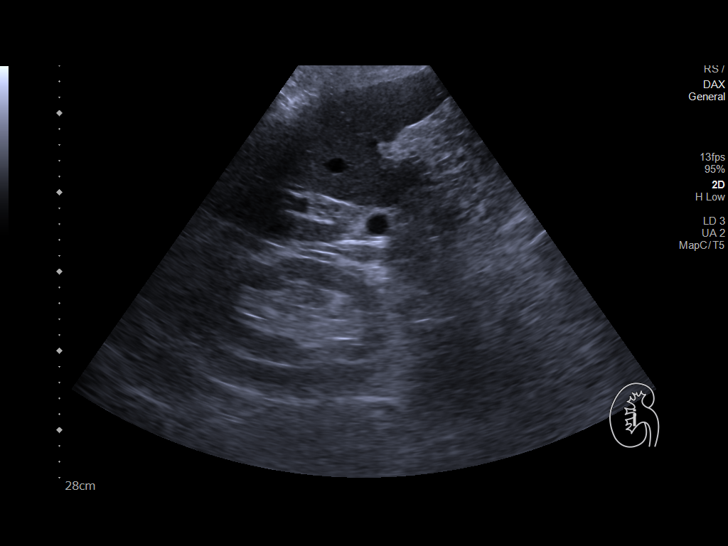
[im 4/48]
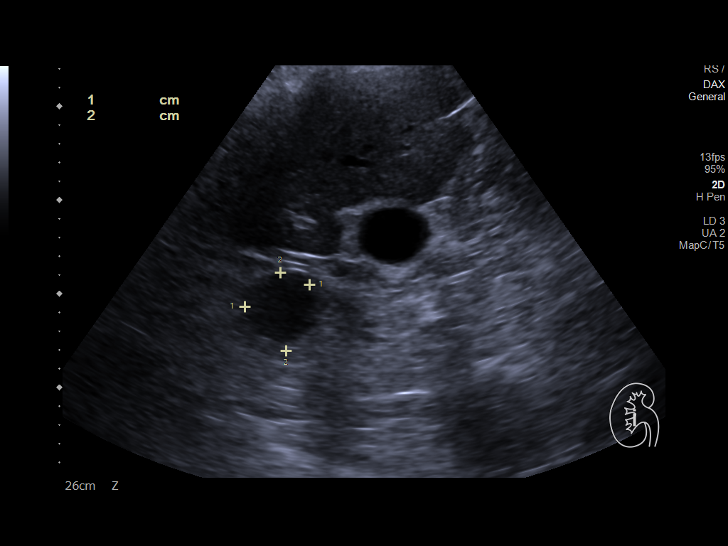
[im 8/48]
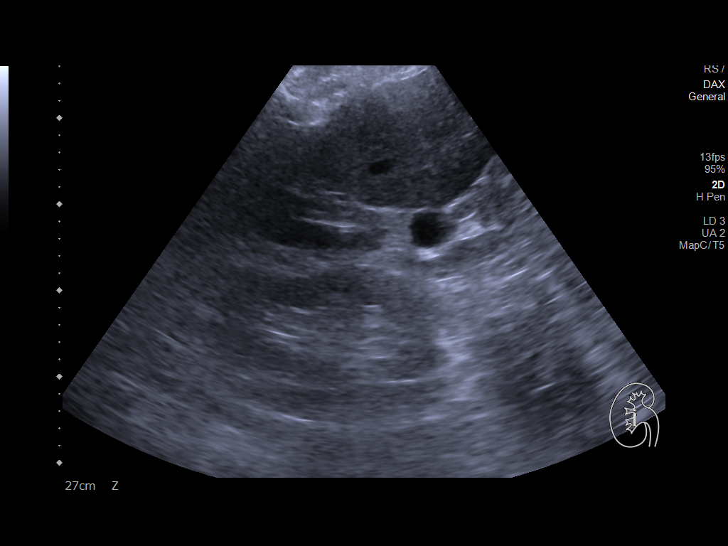
[im 12/48]
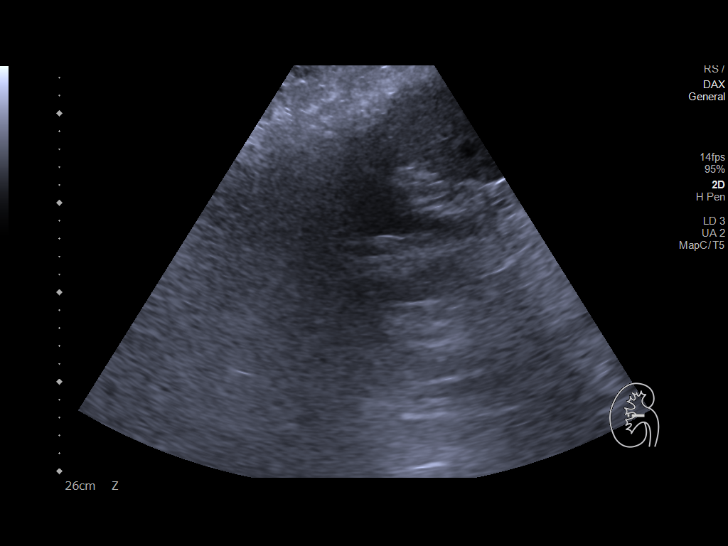
[im 16/48]
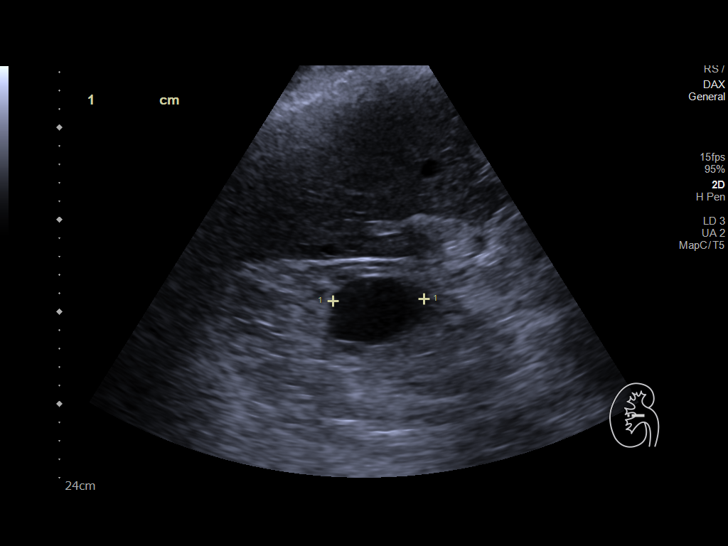
[im 18/48]
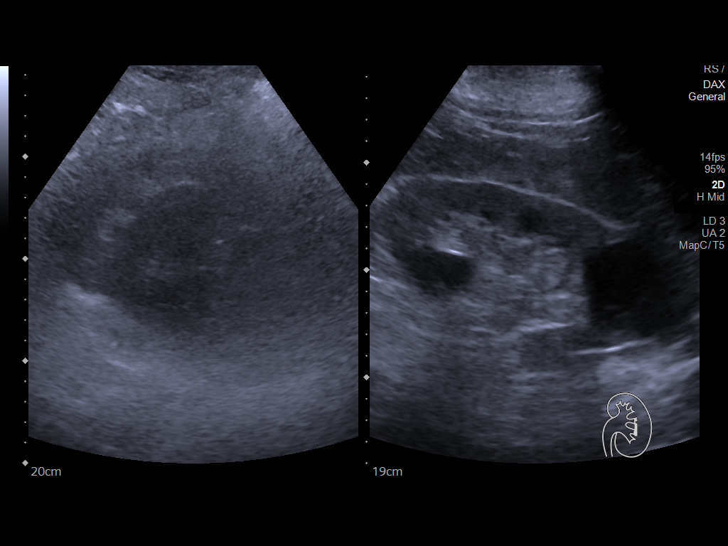
[im 22/48]
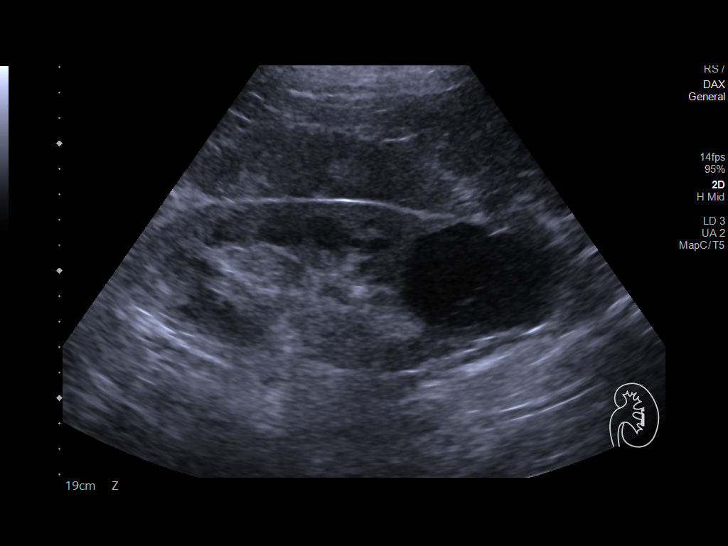
[im 26/48]
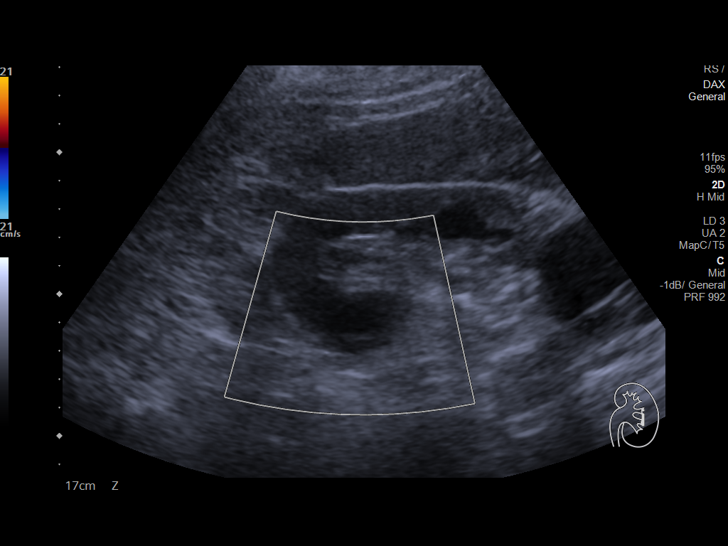
[im 30/48]
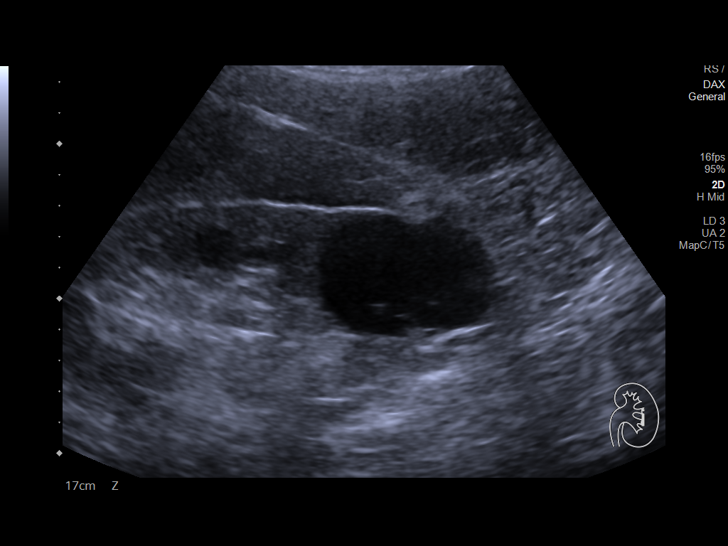
[im 32/48]
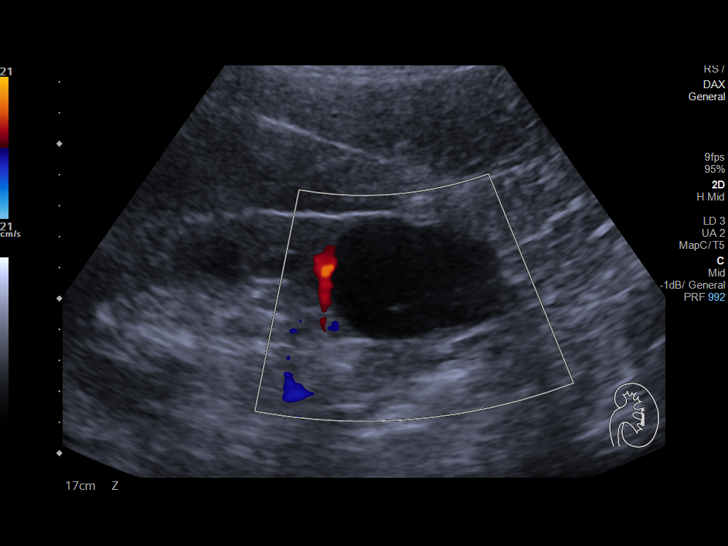
[im 36/48]
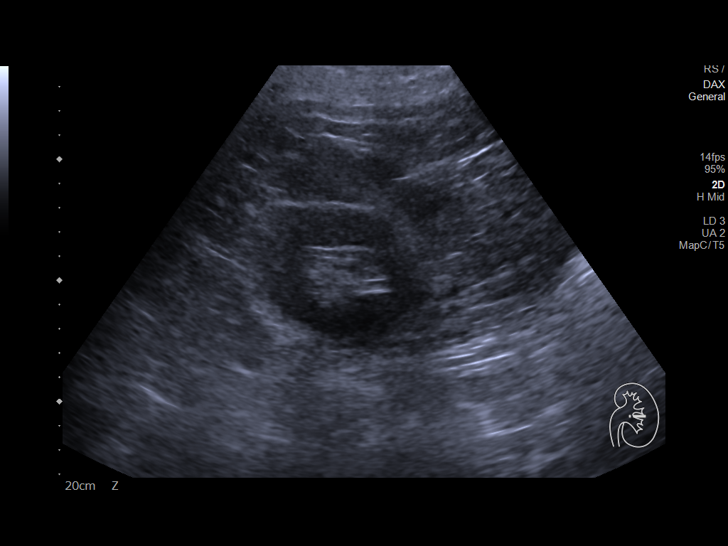
[im 40/48]
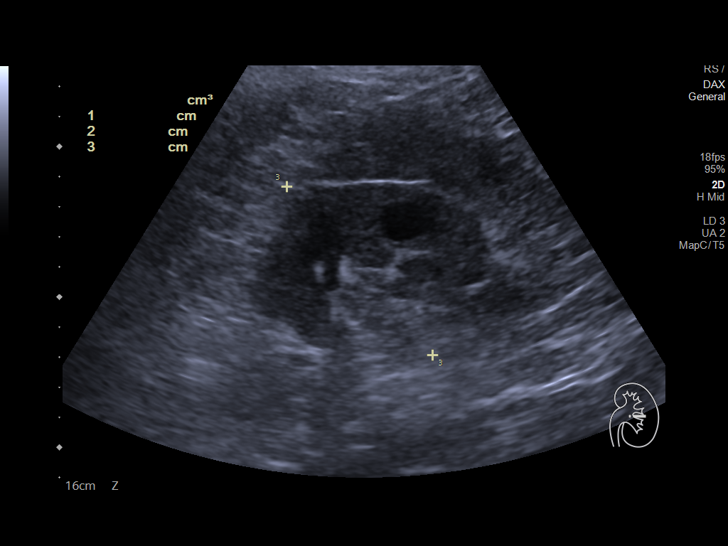
[im 44/48]
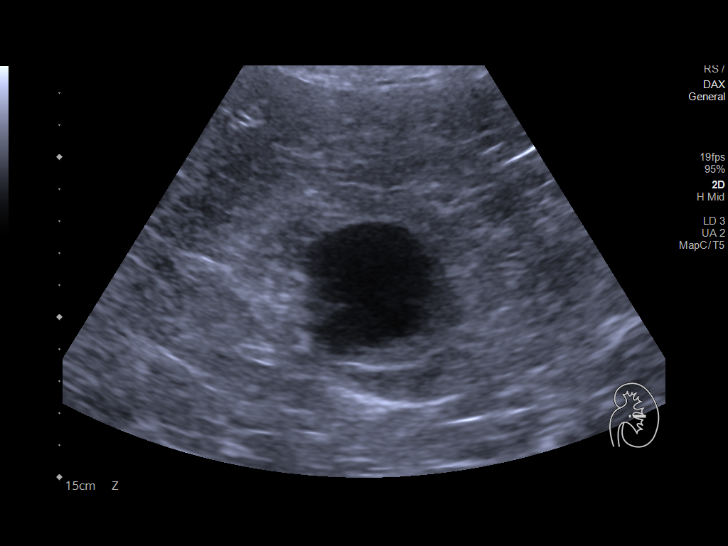
[im 48/48]
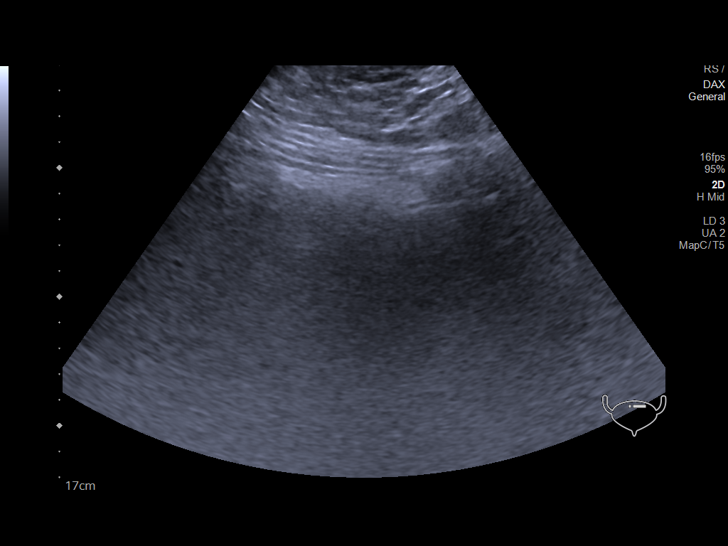

[14 of 25 positions shown; findings below may reference images not displayed]

FINDINGS: Right Kidney:

Renal measurements: 11.2 by 6.7 x 7.2 cm = volume: 301.5 mL. Mild
increased cortical echogenicity. Well-circumscribed, anechoic cyst
arising from upper pole of right kidney measures 3.7 x 4.2 by
cm. No hydronephrosis

Left Kidney:

Renal measurements: 16.5 x 6.8 x 7.4 cm = volume: 436.2 mL.
Increased parenchymal echogenicity. Multiple cysts are identified.
The largest arises from the inferior pole of the left kidney
measuring 6.0 x 4.3 x 4.1 cm. This cyst is mildly complicated with
some internal echoes which may represent internal septation.

Bladder:

Not visualized.

Other:

None.
IMPRESSION: 1. No hydronephrosis.
2. Increased cortical echogenicity of both kidneys compatible with
chronic medical renal disease.
3. Bilateral renal cysts. Cyst arising from inferior pole of the
left kidney is complicated by internal areas of increased
echogenicity which may reflect septations. Consider more definitive
characterization with nonemergent renal protocol MRI with and
without contrast material.

## 2022-05-14 IMAGING — CT CT HEAD W/O CM
4 of 5 series · 17 of 47 positions shown, 19 images · non-contrast
Comparison: January 30, 2020

CLINICAL DATA: Worsening mental status change

EXAM:
CT HEAD WITHOUT CONTRAST
TECHNIQUE: Contiguous axial images were obtained from the base of the skull
through the vertex without intravenous contrast.

[Series 4: head without · axial · non-contrast · 0.46mm/px · z∈[-148,-23]mm · 6 of 37 slices shown, 8 images]
[im 6/37  brain]
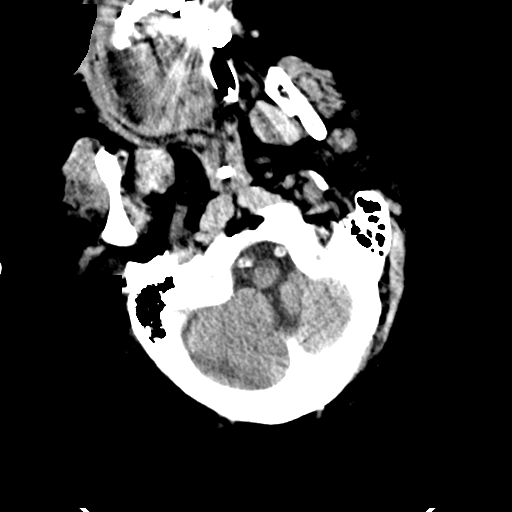
[im 6/37  bone]
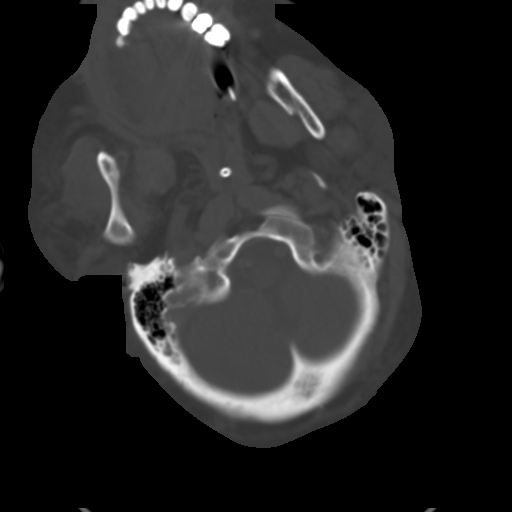
[im 11/37  brain]
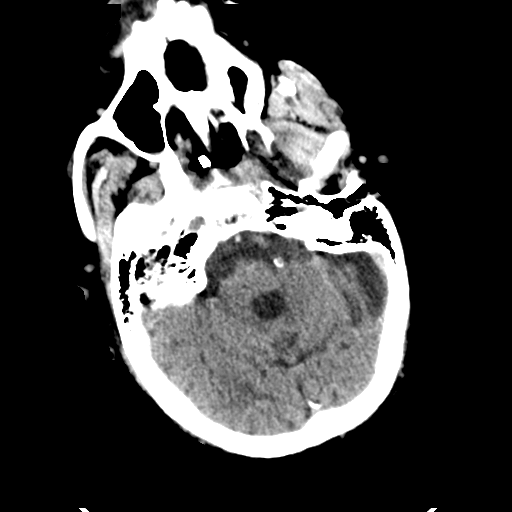
[im 16/37  brain]
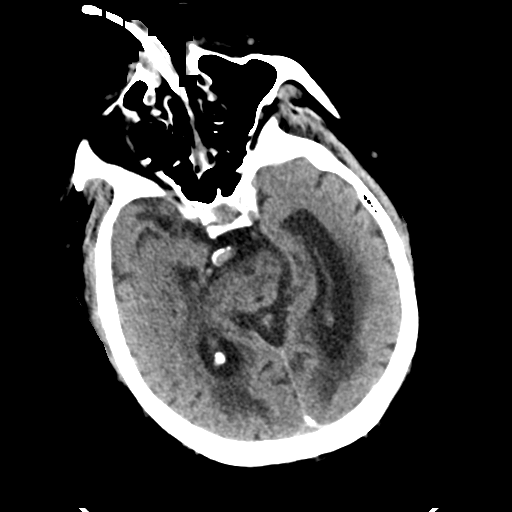
[im 21/37  brain]
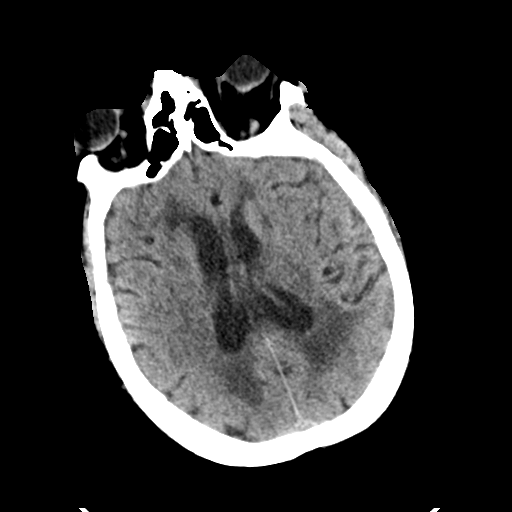
[im 26/37  brain]
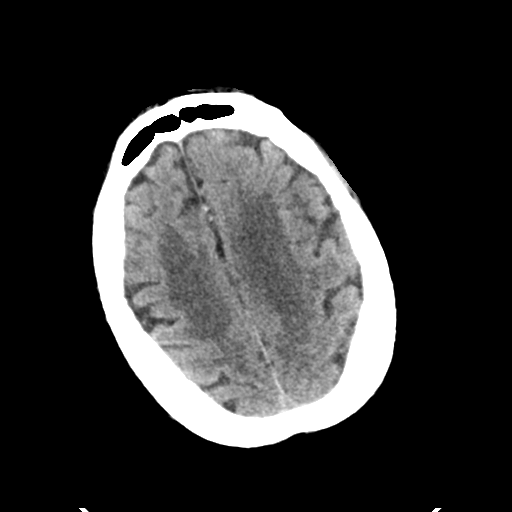
[im 26/37  bone]
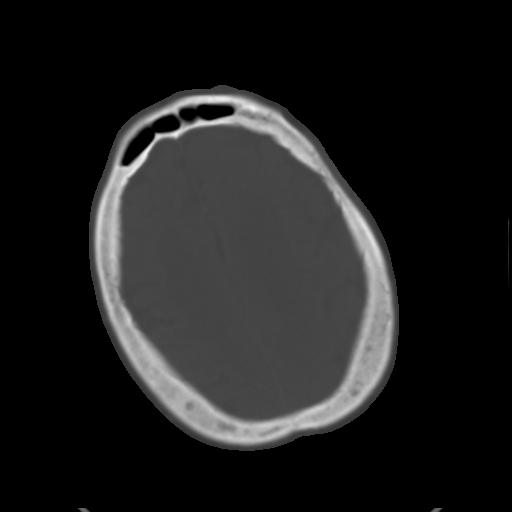
[im 31/37  brain]
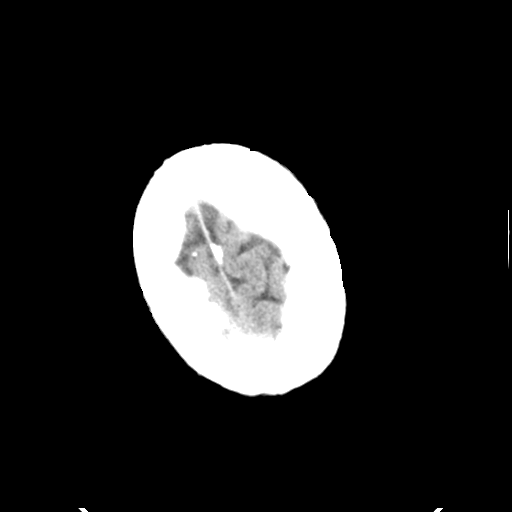

[Series 5: head bone · axial · 0.46mm/px · z∈[-153,-71]mm · 5 of 93 slices shown]
[im 11/93  bone]
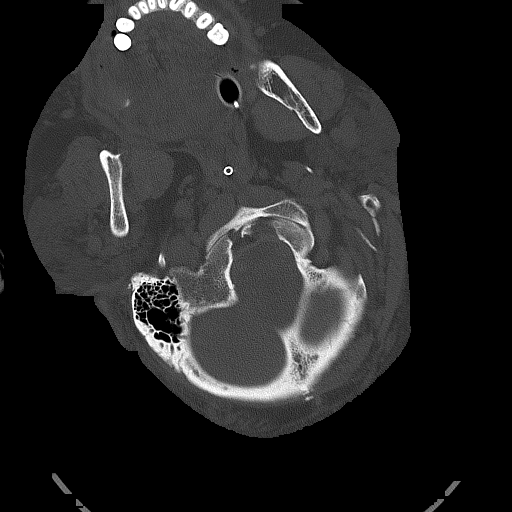
[im 21/93  bone]
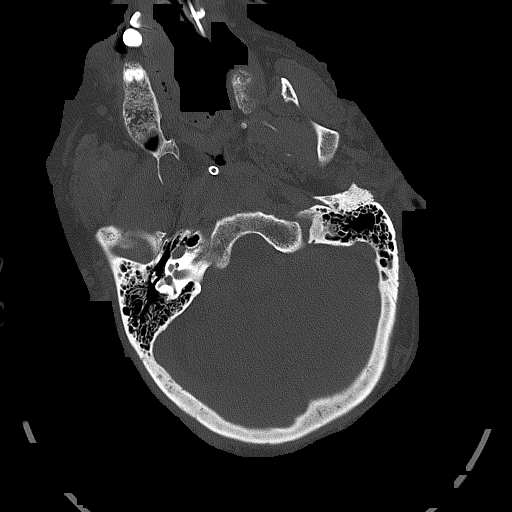
[im 31/93  bone]
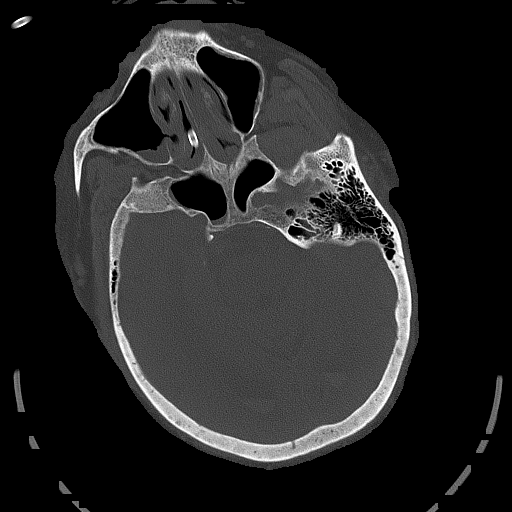
[im 41/93  bone]
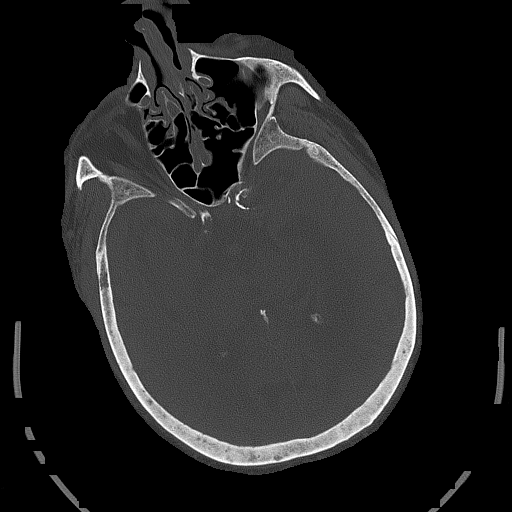
[im 52/93  bone]
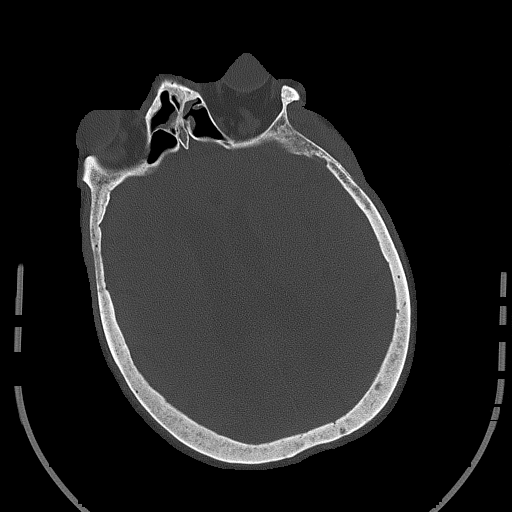

[Series 6: head without cor · coronal · non-contrast · 0.39mm/px · 3 of 70 slices shown]
[im 24/70  brain]
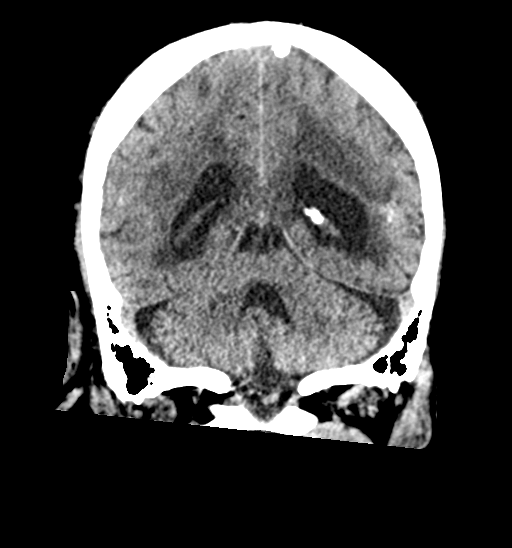
[im 31/70  brain]
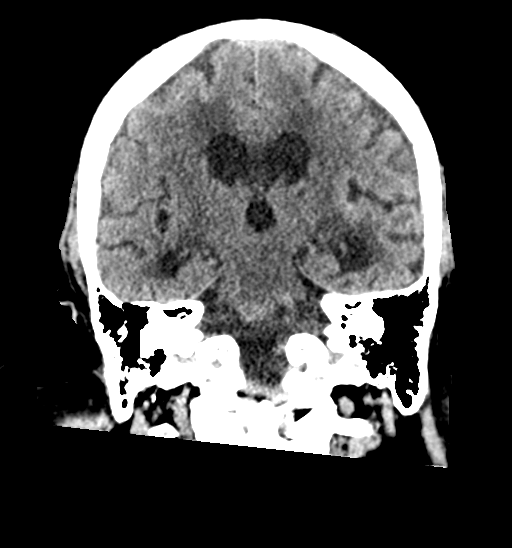
[im 39/70  brain]
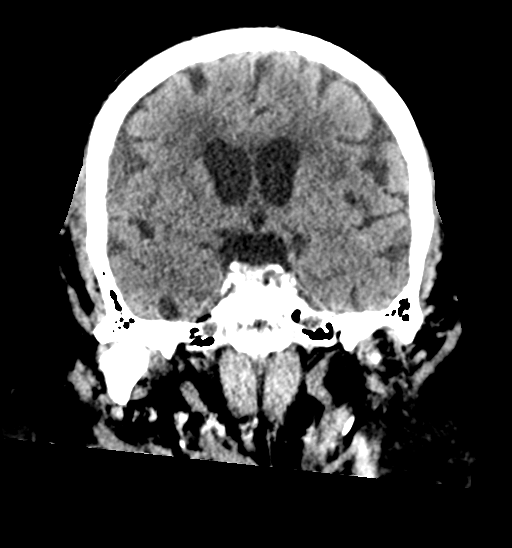

[Series 7: head without sag · sagittal · non-contrast · 0.40mm/px · 3 of 54 slices shown]
[im 18/54  brain]
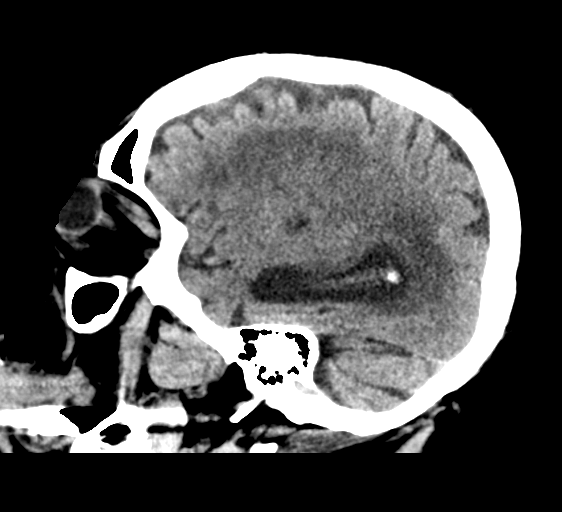
[im 27/54  brain]
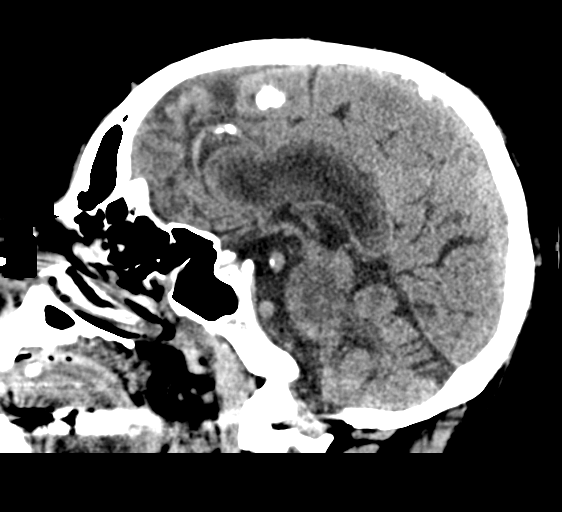
[im 36/54  brain]
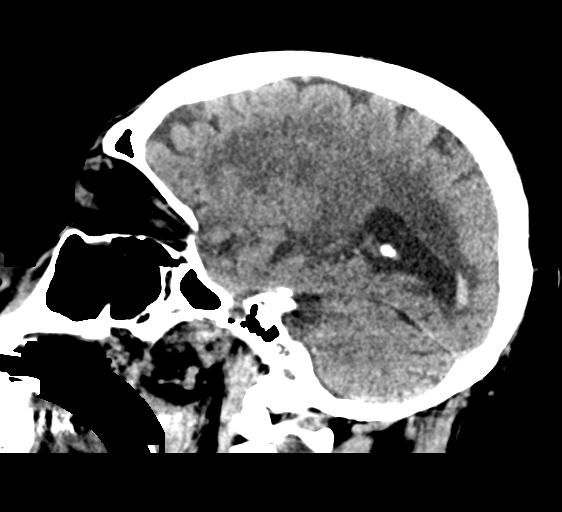

[17 of 47 positions shown; findings below may reference images not displayed]

FINDINGS: Brain: Again noted is a small amount of layering intraventricular
hemorrhage in the posterior horns of the lateral ventricles as on
prior exam. Again noted is a small amount of subarachnoid hemorrhage
overlying the left parietal lobe as on prior exam. No new
extra-axial collections are seen. There is dilatation the ventricles
and sulci consistent with age-related atrophy. Low-attenuation
changes in the deep white matter consistent with small vessel
ischemia.

Vascular: No hyperdense vessel. Calcifications are seen within the
internal carotid artery.

Skull: The skull is intact. No fracture or focal lesion identified.

Sinuses/Orbits: Bilateral ethmoid air cell mucosal thickening is
seen. Small amount of fluid within right maxillary sinus.

Other: None
IMPRESSION: Stable examination with small amount of bilateral intraventricular
hemorrhage and subarachnoid overlying the left parietal lobe. No new
hemorrhage.

Findings consistent with age related atrophy and chronic small
vessel ischemia

## 2022-05-14 IMAGING — DX DG CHEST 1V PORT
1 series · 2 of 2 positions shown · non-contrast
Comparison: 01/30/2020

CLINICAL DATA: ET tube

EXAM:
PORTABLE CHEST 1 VIEW

[Series 1: chest ap · 0.14mm/px · 2 of 2 slices shown]
[im 1/2]
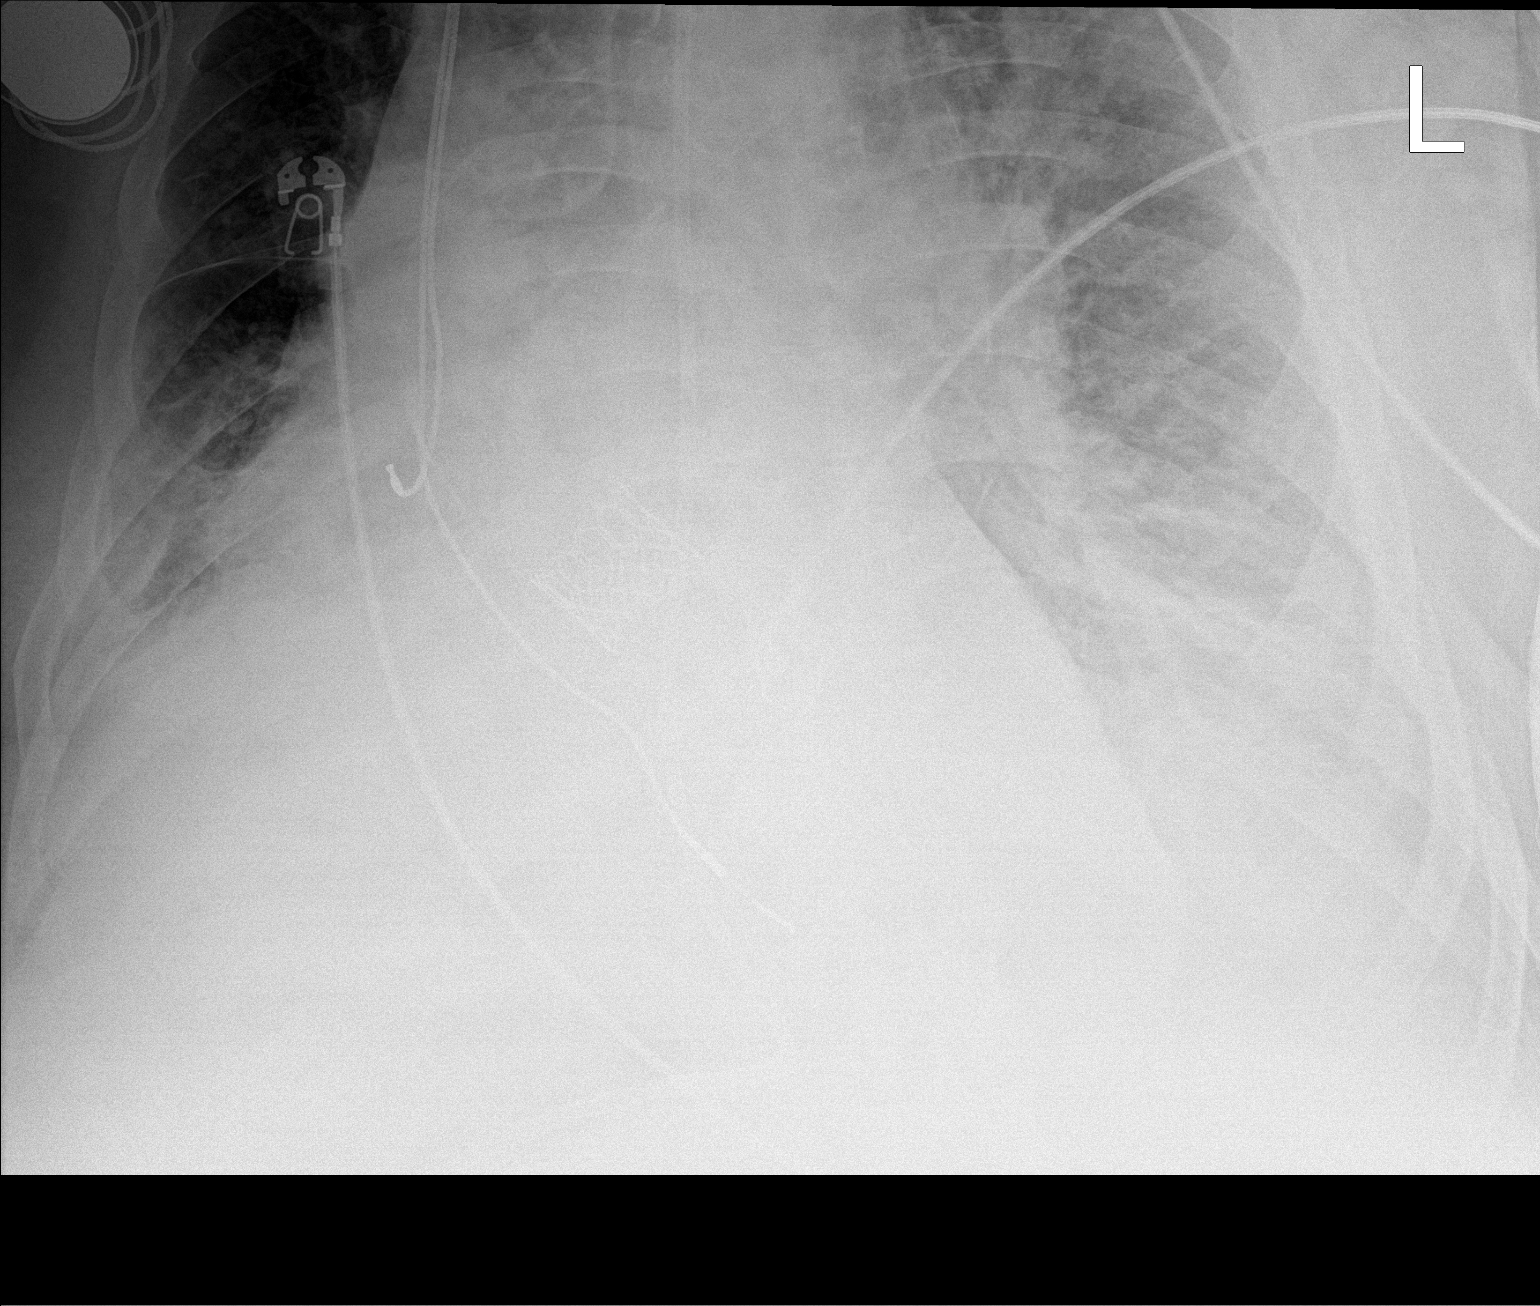
[im 2/2]
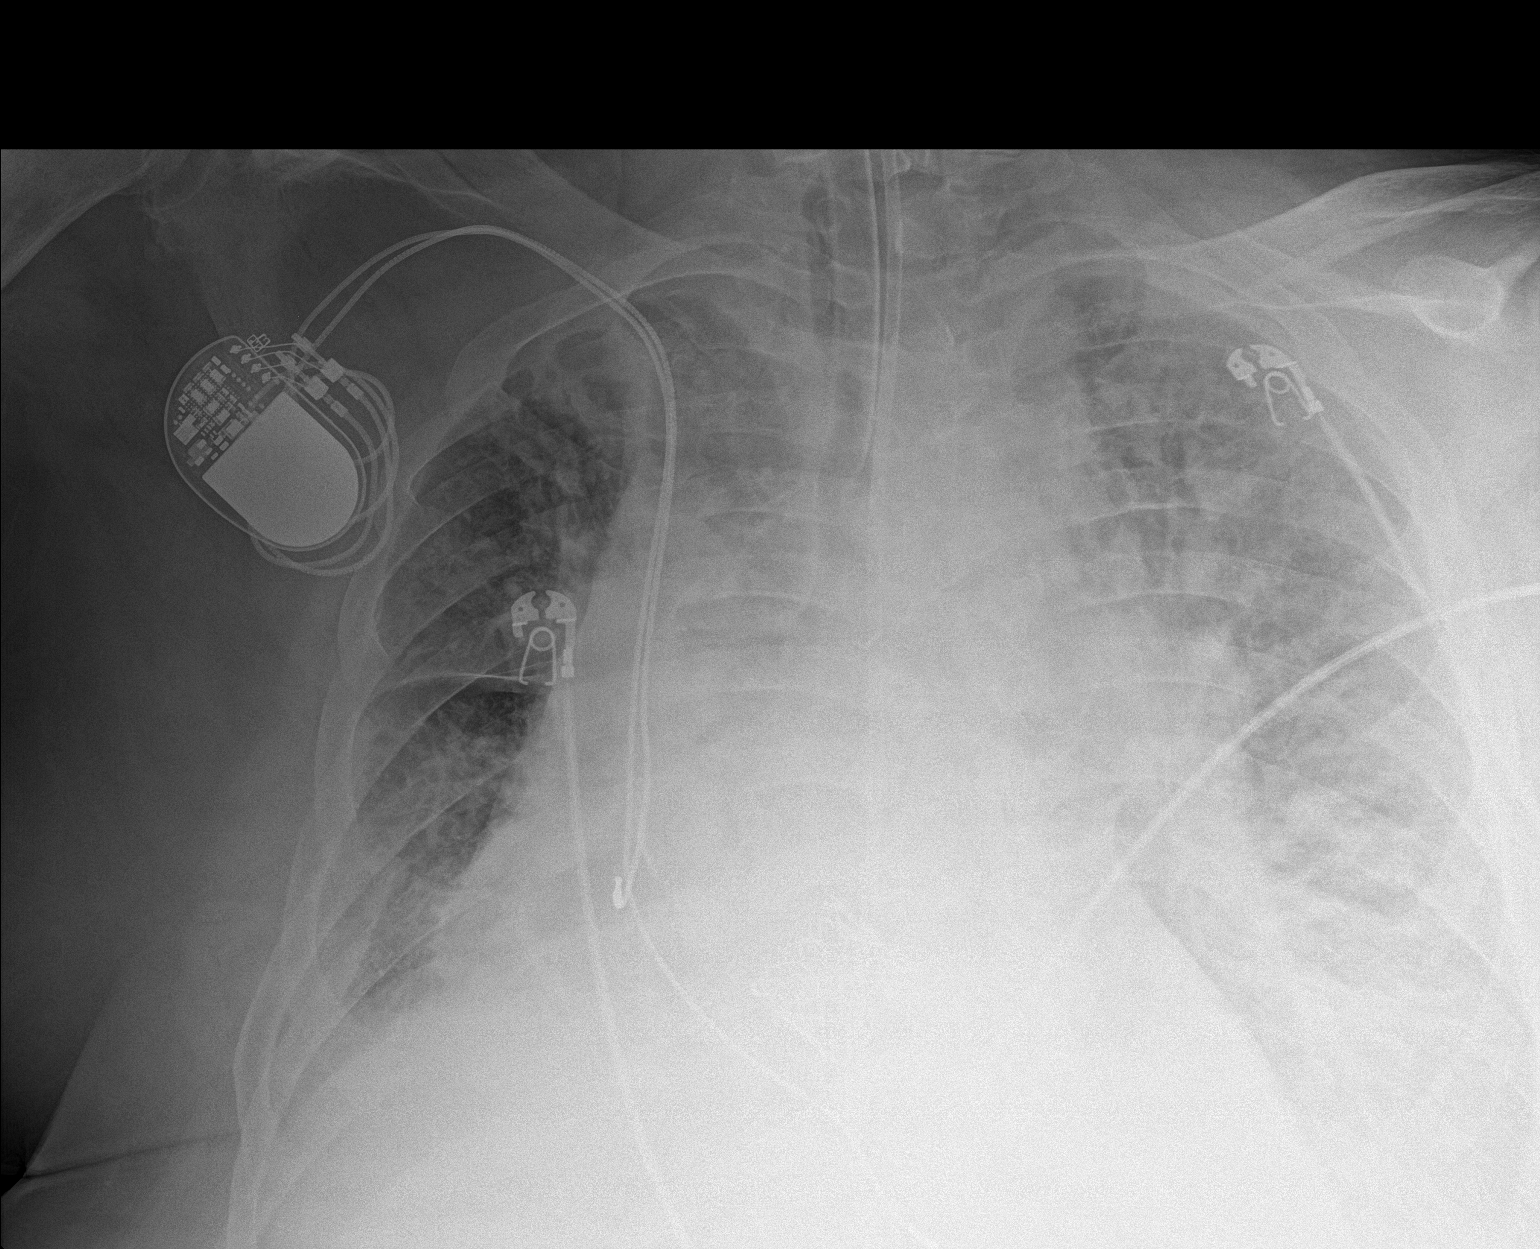

[2 of 2 positions shown; findings below may reference images not displayed]

FINDINGS: Endotracheal tube is 4.4 cm above the carina. Right pacer is
unchanged. Cardiomegaly. Diffuse bilateral airspace disease,
worsening since prior study, favor edema. Suspect small bilateral
effusions.
IMPRESSION: Endotracheal tube 4.4 cm above the carina.

Worsening bilateral airspace disease, likely edema. Small layering
effusions.

## 2022-05-15 IMAGING — DX DG CHEST 1V PORT
1 series · 1 of 1 positions shown · non-contrast
Comparison: One-view chest x-ray 01/31/2020

CLINICAL DATA: Endotracheal intubation.

EXAM:
PORTABLE CHEST 1 VIEW

[chest ap]
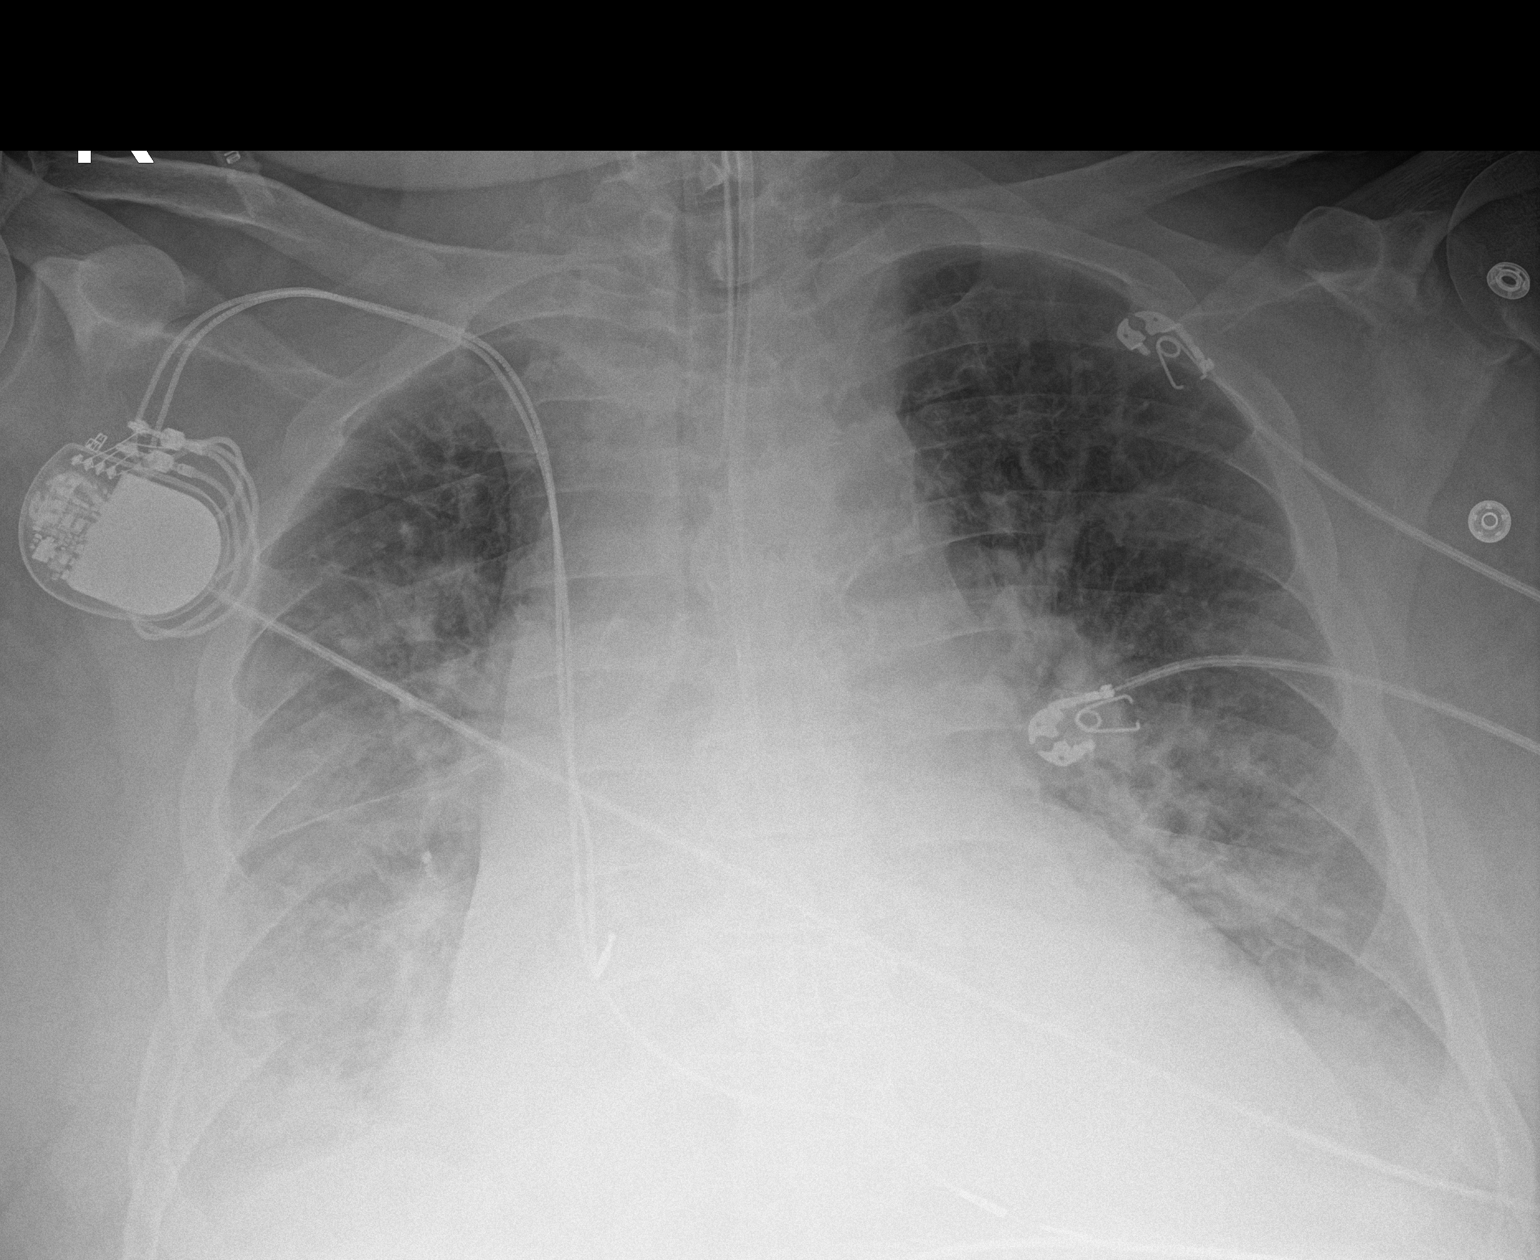

[1 of 1 positions shown; findings below may reference images not displayed]

FINDINGS: Endotracheal tube is stable. Heart is enlarged. Diffuse edema and
bilateral effusions are similar the prior exam. Feeding tube extends
off the inferior border of the film.
IMPRESSION: 1. Stable appearance of diffuse edema and bilateral effusions.
2. Stable cardiomegaly.
3. Stable endotracheal tube.

## 2022-05-18 IMAGING — DX DG CHEST 1V PORT
1 series · 1 of 1 positions shown · non-contrast
Comparison: One-view chest x-ray 02/03/2020

CLINICAL DATA: Acute respiratory failure.

EXAM:
PORTABLE CHEST 1 VIEW

[chest]
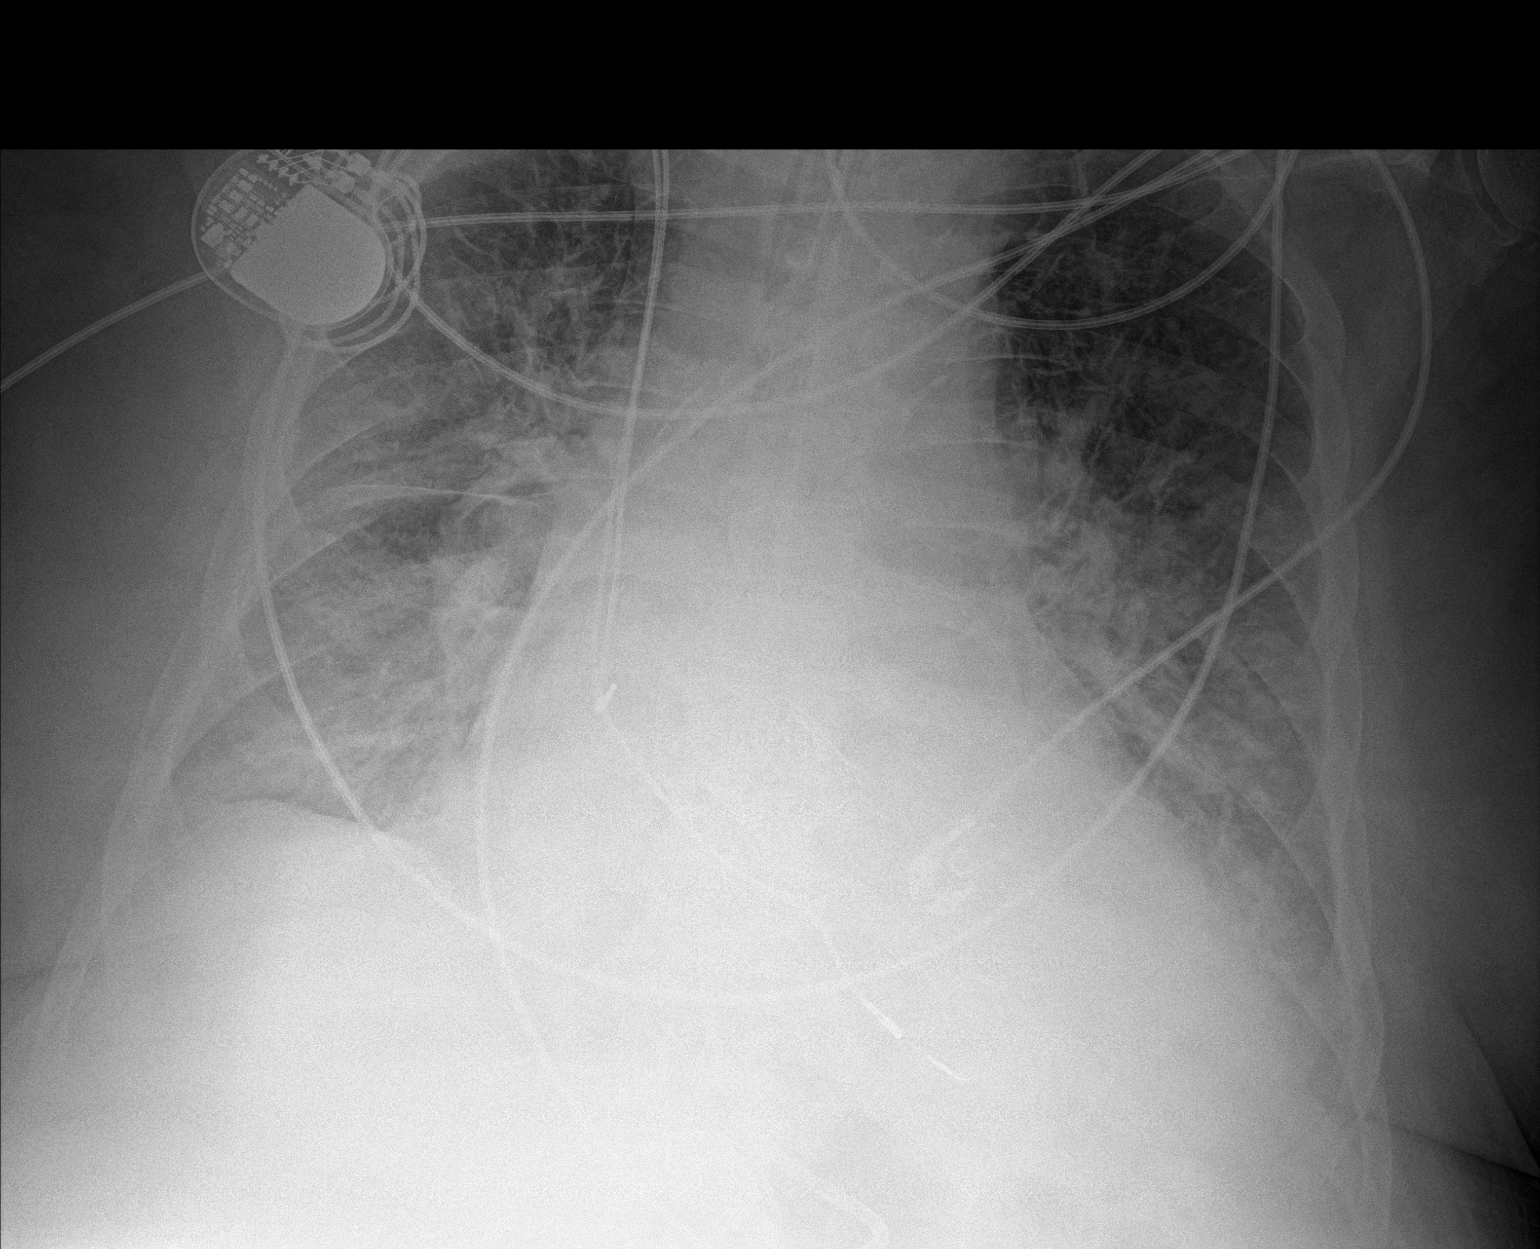

[1 of 1 positions shown; findings below may reference images not displayed]

FINDINGS: The heart is enlarged. Endotracheal tube terminates 5.5 cm above the
carina. Pacing wires are stable. Feeding tube courses off the
inferior border the film.

Diffuse interstitial edema has increased slightly. Bilateral pleural
effusions are present. Bibasilar airspace opacities are noted.
IMPRESSION: Cardiomegaly with increasing interstitial edema and bilateral
pleural effusions compatible with congestive heart failure.

Bibasilar airspace opacities likely reflect atelectasis. Infection
is not excluded.

## 2022-05-20 IMAGING — DX DG CHEST 1V PORT
1 series · 1 of 1 positions shown · non-contrast
Comparison: 02/04/2020

CLINICAL DATA: Cerebral hemorrhage. Seizure. Endotracheal tube
placement.

EXAM:
PORTABLE CHEST 1 VIEW

[chest ap]
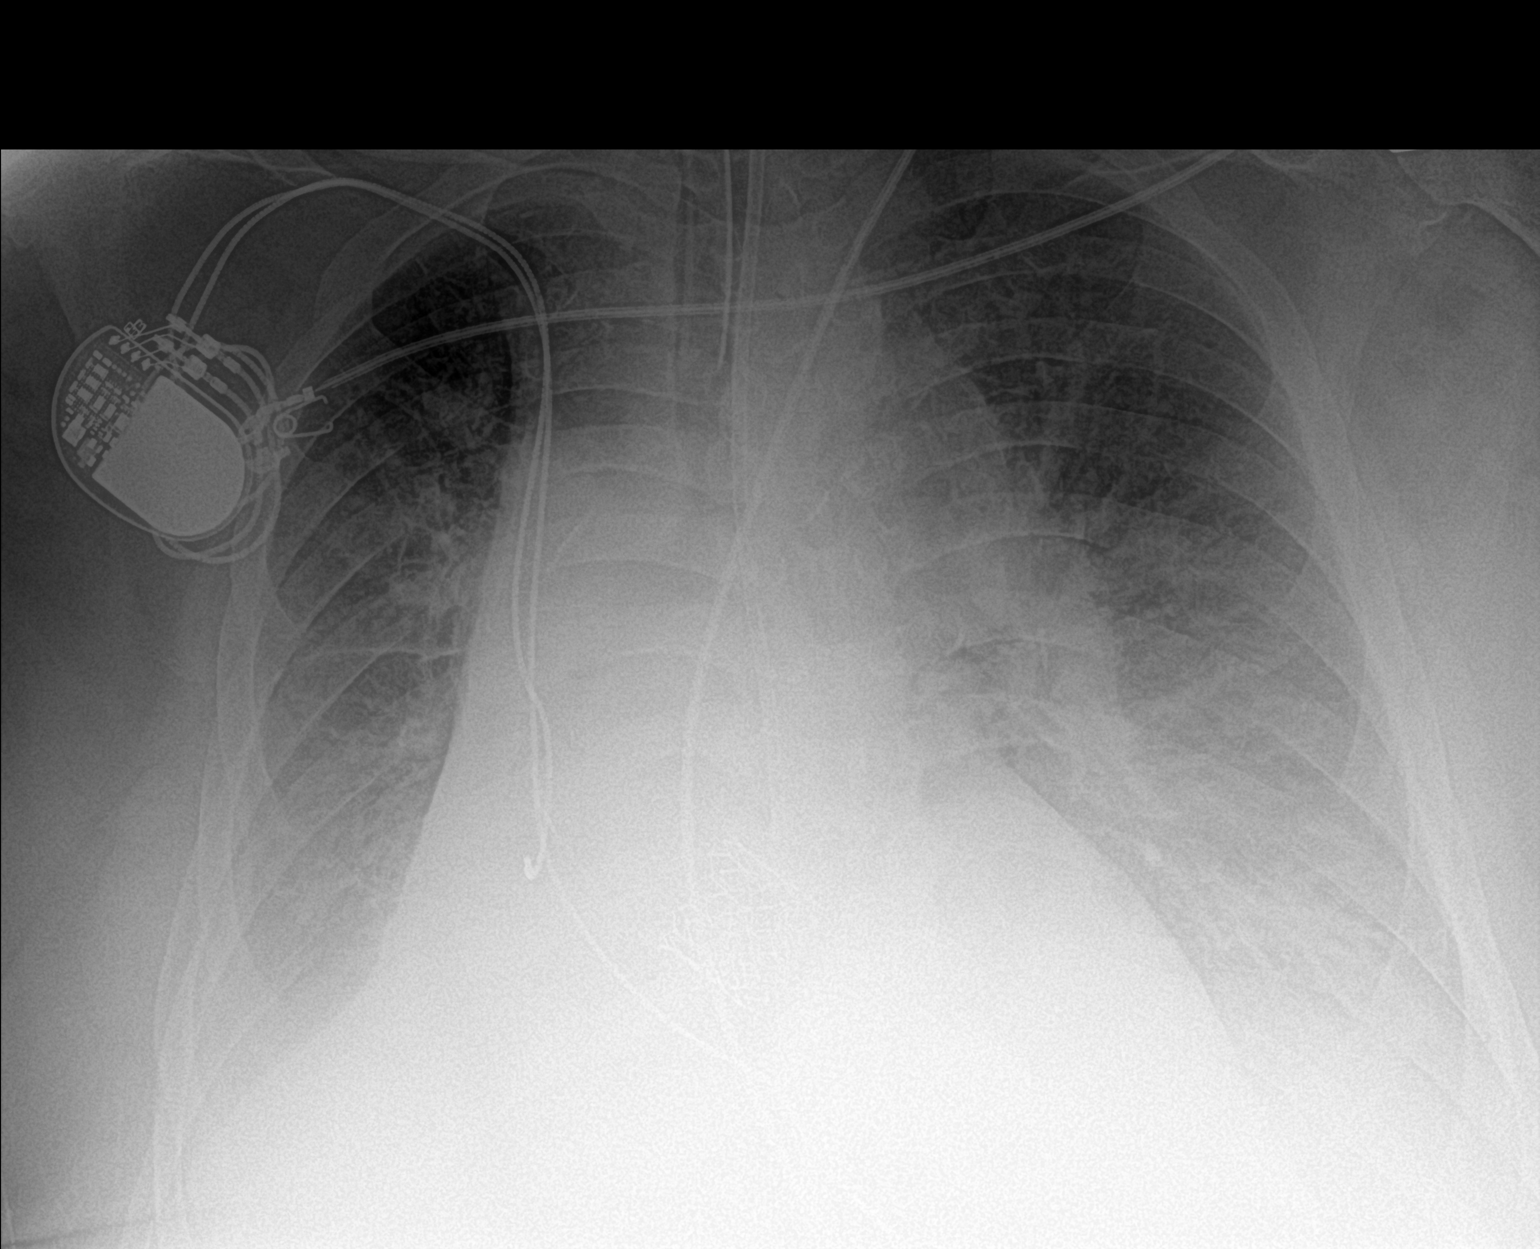

[1 of 1 positions shown; findings below may reference images not displayed]

FINDINGS: 9149 hours. Endotracheal tube tip is 4.6 cm above the base of the
carina. Feeding tube is obscured at the level of the lower
mediastinum due to technique. The cardio pericardial silhouette is
enlarged. Bibasilar collapse/consolidation again noted with vascular
congestion and pulmonary edema. Telemetry leads overlie the chest.
IMPRESSION: 1. Endotracheal tube tip is 4.6 cm above the base of the carina.
2. Bibasilar collapse/consolidation with pulmonary edema, similar to
prior.

## 2022-05-28 IMAGING — CT CT CHEST W/O CM
2 of 3 series · 15 of 36 positions shown, 18 images · non-contrast
Comparison: Chest x-ray from earlier today

CLINICAL DATA: Respiratory illness with nondiagnostic x-ray

EXAM:
CT CHEST WITHOUT CONTRAST
TECHNIQUE: Multidetector CT imaging of the chest was performed following the
standard protocol without IV contrast.

[Series 3: chest w/o 2mm st · axial · non-contrast · 0.81mm/px · z∈[+1097,+1371]mm · 12 of 161 slices shown, 15 images]
[im 12/161  mediastinal]
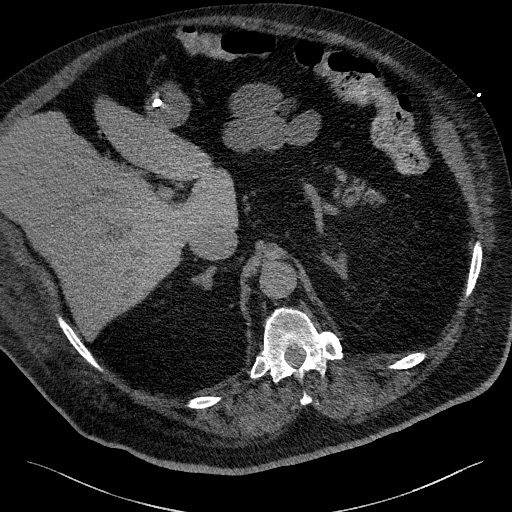
[im 12/161  lung]
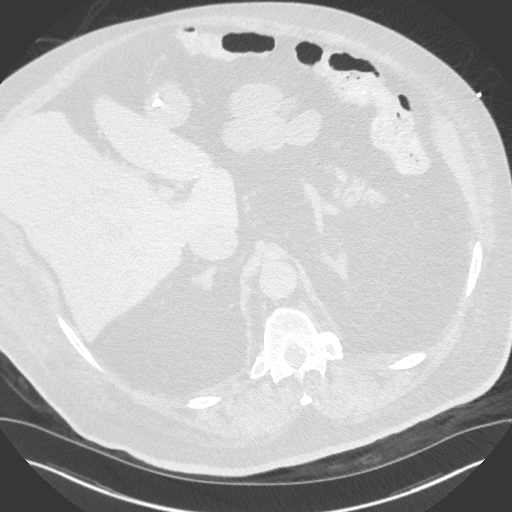
[im 24/161  lung]
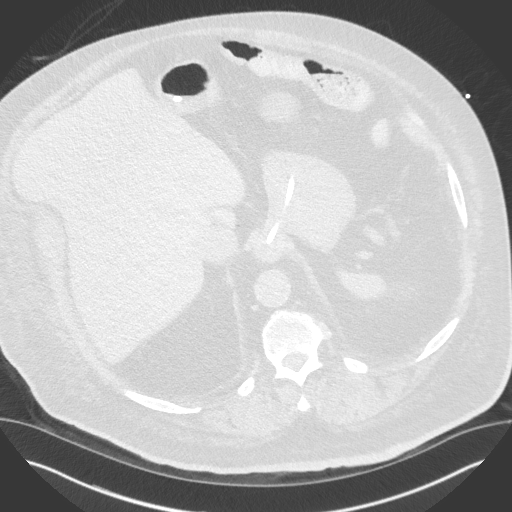
[im 36/161  lung]
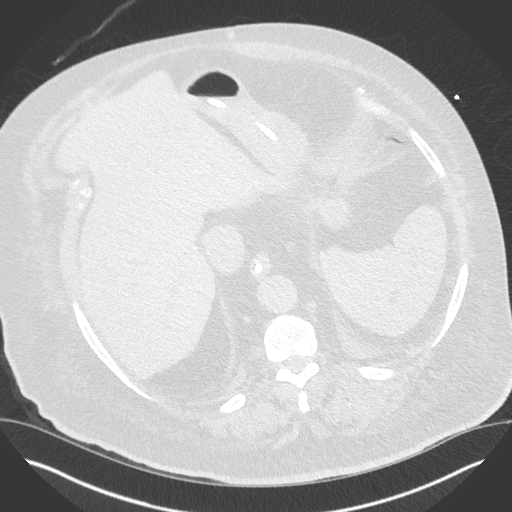
[im 48/161  lung]
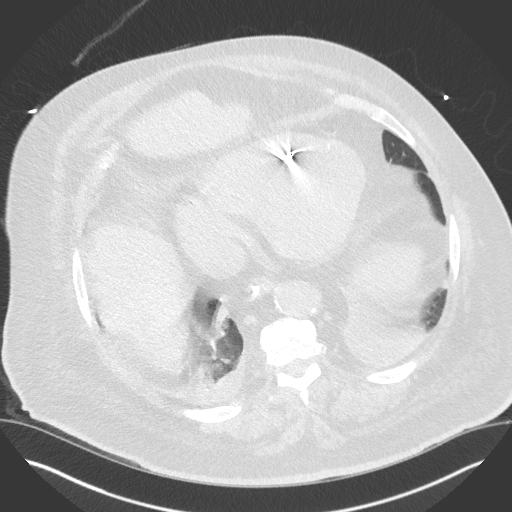
[im 60/161  mediastinal]
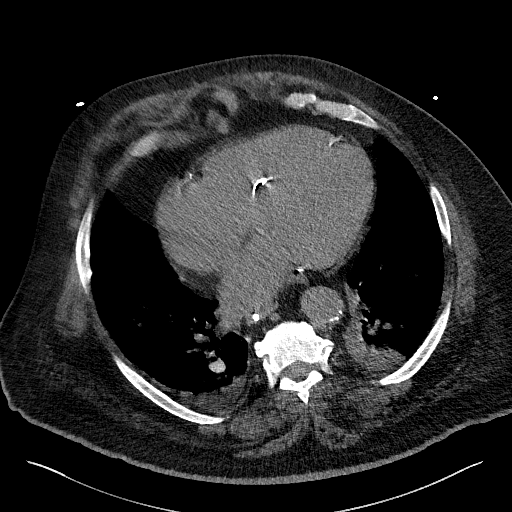
[im 60/161  lung]
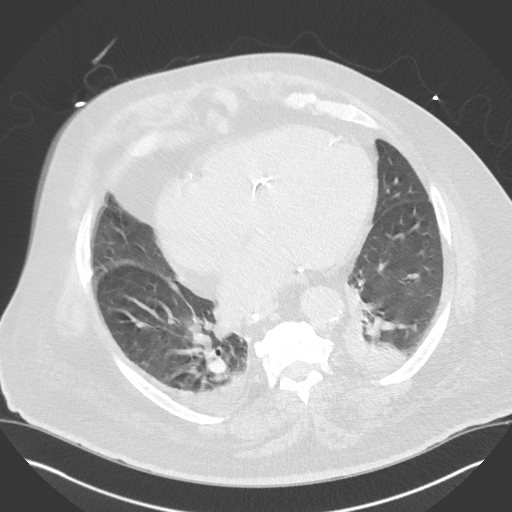
[im 72/161  lung]
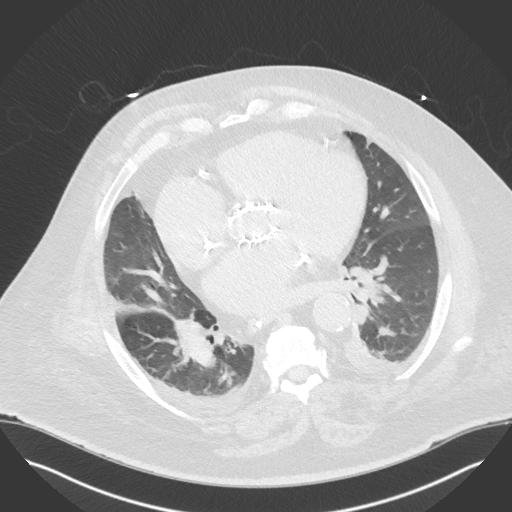
[im 89/161  lung]
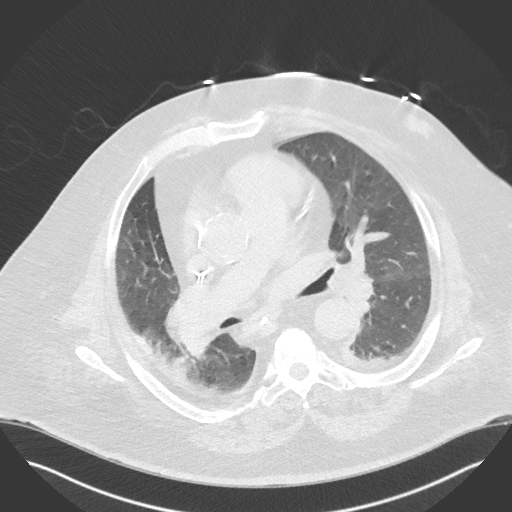
[im 101/161  lung]
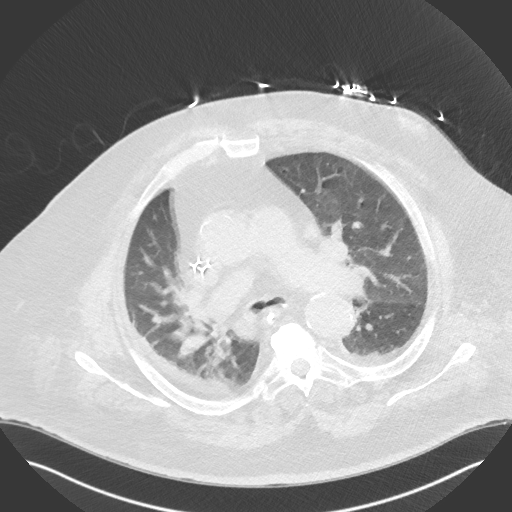
[im 113/161  mediastinal]
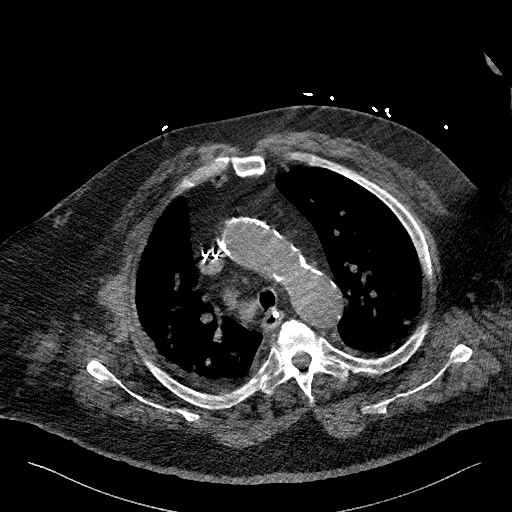
[im 113/161  lung]
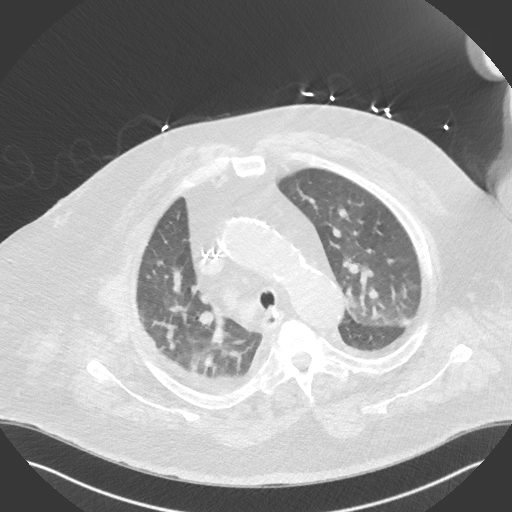
[im 125/161  lung]
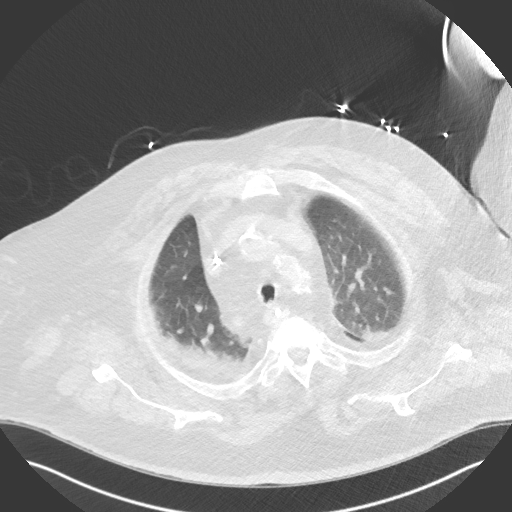
[im 137/161  lung]
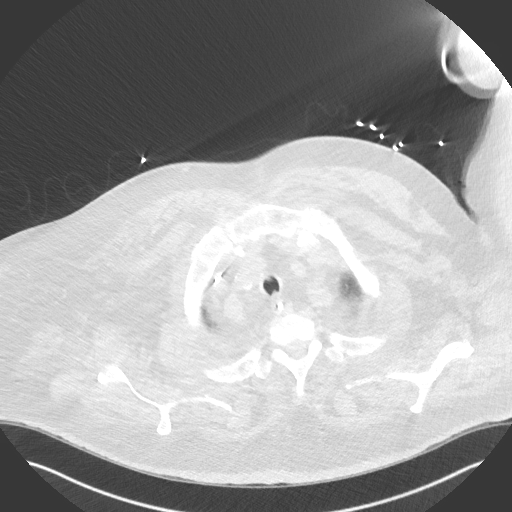
[im 149/161  lung]
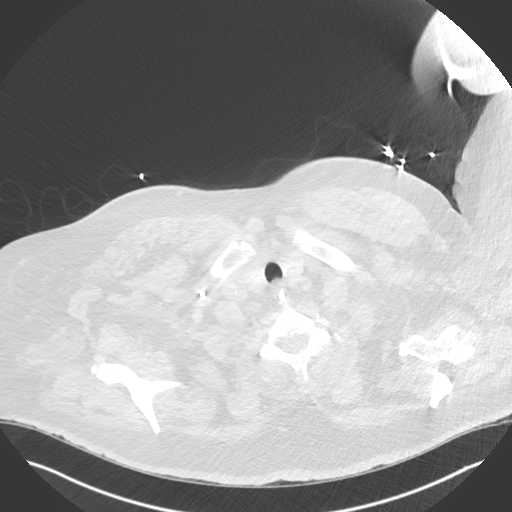

[Series 6: chest w/o 2mm st cor · coronal · non-contrast · 0.63mm/px · 3 of 168 slices shown]
[im 34/168  lung]
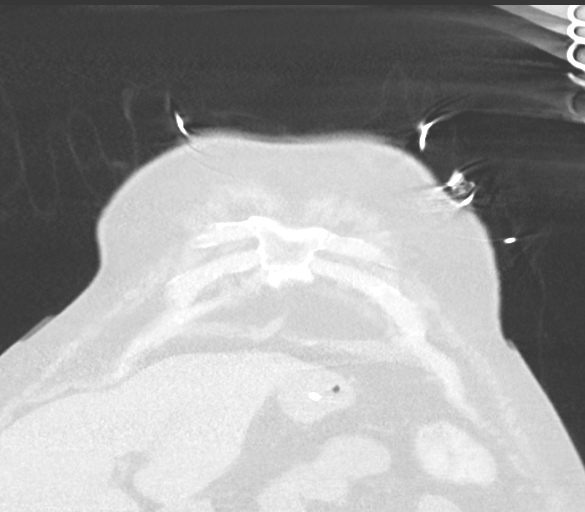
[im 67/168  lung]
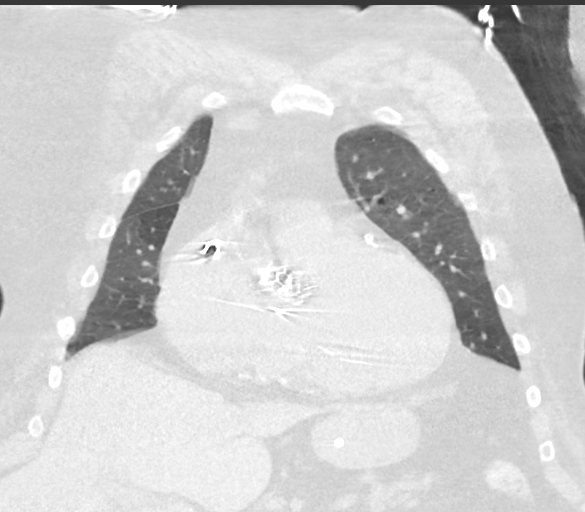
[im 101/168  lung]
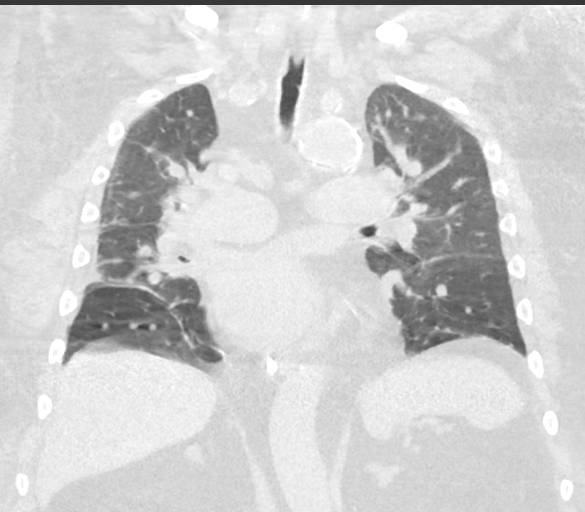

[15 of 36 positions shown; findings below may reference images not displayed]

FINDINGS: Cardiovascular: Cardiomegaly that is chronic. No pericardial
effusion. Dual-chamber pacer from the right with leads in
unremarkable position. Transcatheter aortic valve replacement.
Extensive atherosclerotic calcification of the aorta and coronaries.
Enlarged appearance of central pulmonary arteries.

Mediastinum/Nodes: Feeding tube with tip reaching the pylorus.
Prominent mediastinal lymph nodes likely related to vascular
congestion.

Lungs/Pleura: Dependent atelectasis and trace pleural fluid. No
consolidation or Kerley lines

Upper Abdomen: Negative

Musculoskeletal: Generalized spondylosis. No acute or aggressive
finding.

Other: Bilateral gynecomastia.
IMPRESSION: 1. Dependent atelectasis and trace pleural effusions.
2. Cardiomegaly without pulmonary edema.
3.  Aortic Atherosclerosis ([id number]-865.5).

## 2022-05-29 IMAGING — CT CT HEAD W/O CM
4 series · 16 of 47 positions shown, 18 images · non-contrast
Comparison: Head CT 01/31/2020 and earlier.

CLINICAL DATA: 73-year-old male with worsening mental status,
confusion. Anoxic brain injury. Presented with small volume
intraventricular and subarachnoid hemorrhage last month.

EXAM:
CT HEAD WITHOUT CONTRAST
TECHNIQUE: Contiguous axial images were obtained from the base of the skull
through the vertex without intravenous contrast.

[Series 3: head without · axial · non-contrast · 0.42mm/px · z∈[-150,-20]mm · 7 of 36 slices shown, 9 images]
[im 5/36  brain]
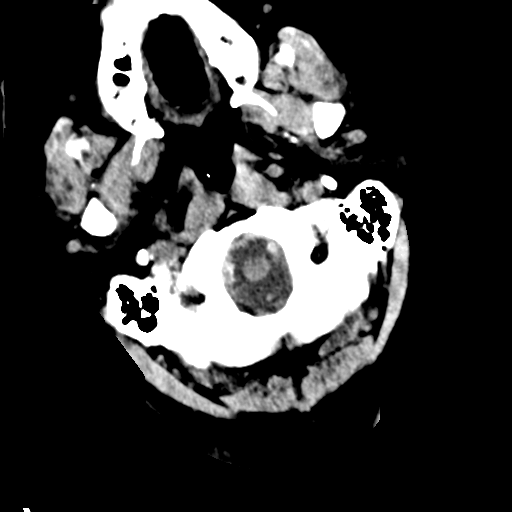
[im 5/36  bone]
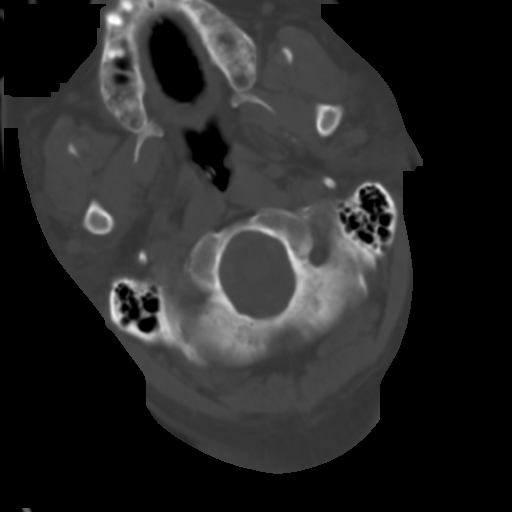
[im 9/36  brain]
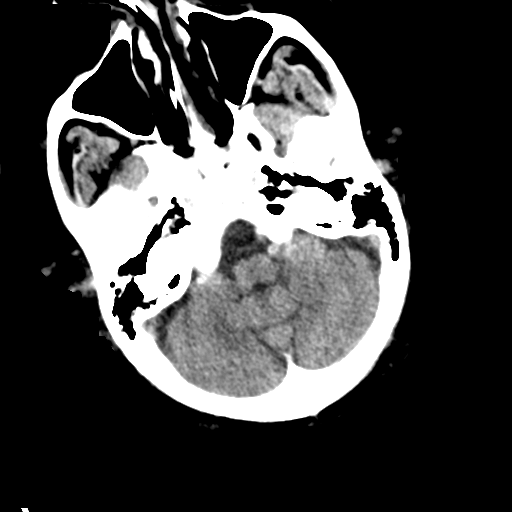
[im 14/36  brain]
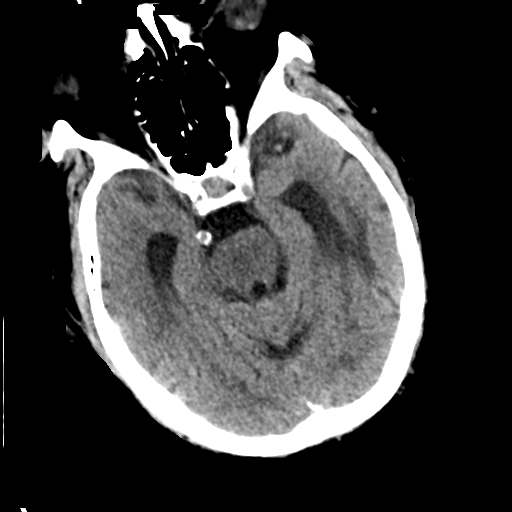
[im 18/36  brain]
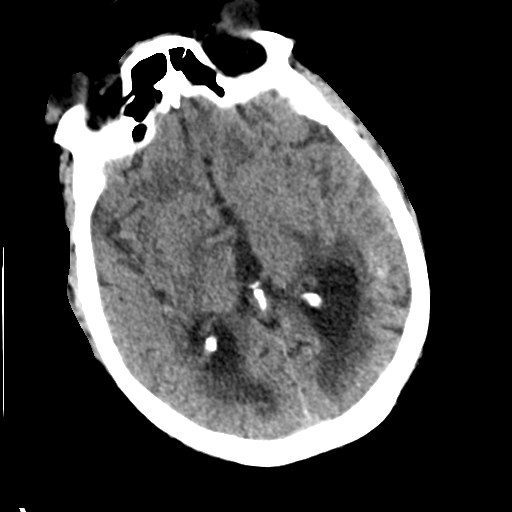
[im 22/36  brain]
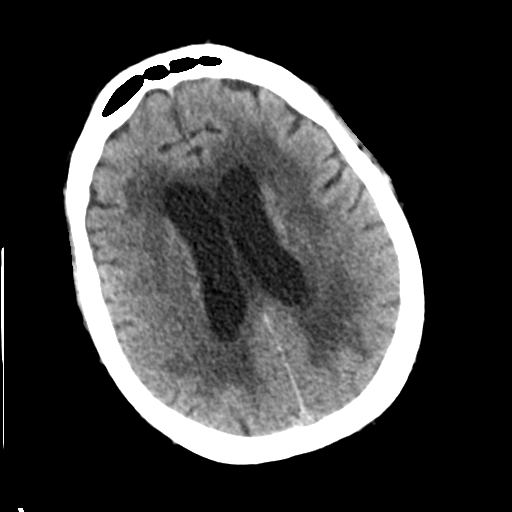
[im 22/36  bone]
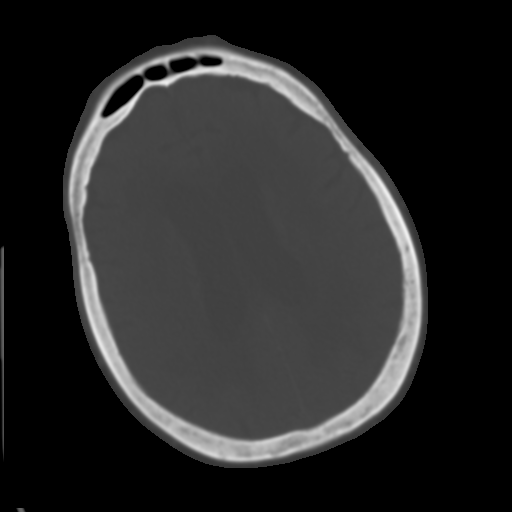
[im 27/36  brain]
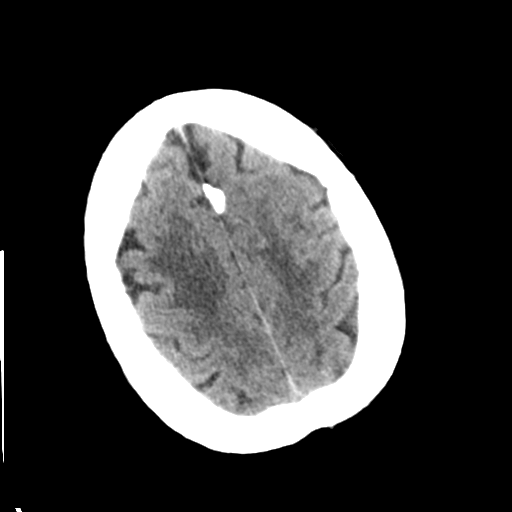
[im 31/36  brain]
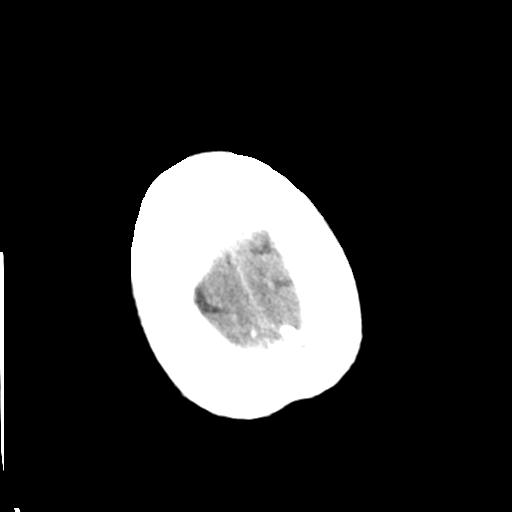

[Series 4: head bone · axial · 0.42mm/px · z∈[-154,-118]mm · 3 of 90 slices shown]
[im 9/90  bone]
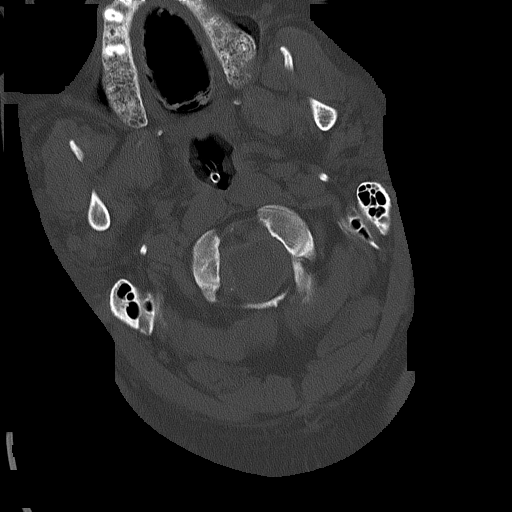
[im 18/90  bone]
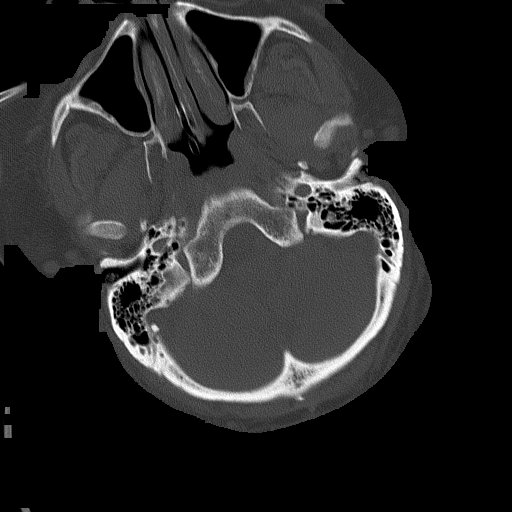
[im 27/90  bone]
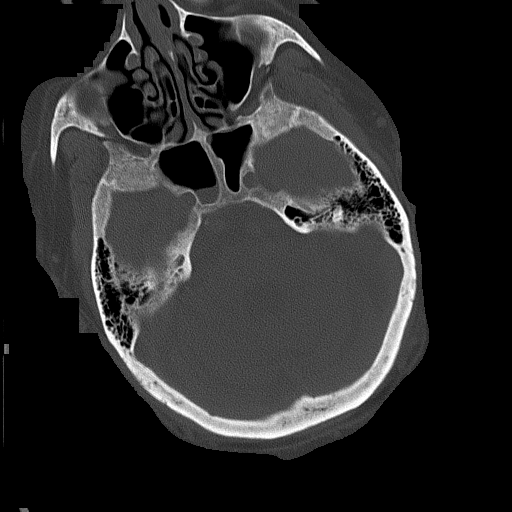

[Series 5: head without cor · coronal · non-contrast · 0.35mm/px · 3 of 73 slices shown]
[im 25/73  brain]
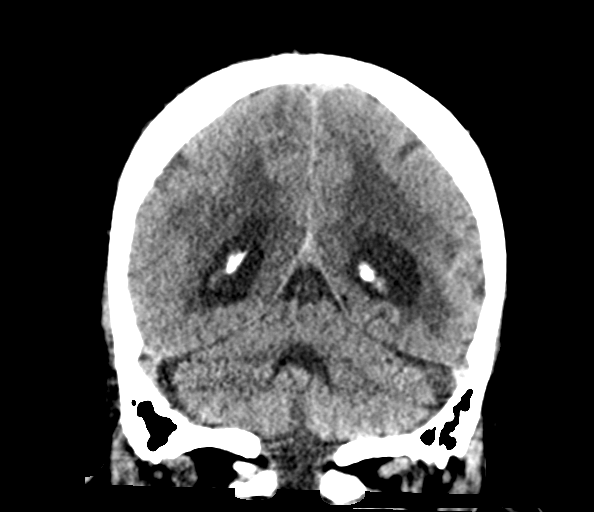
[im 33/73  brain]
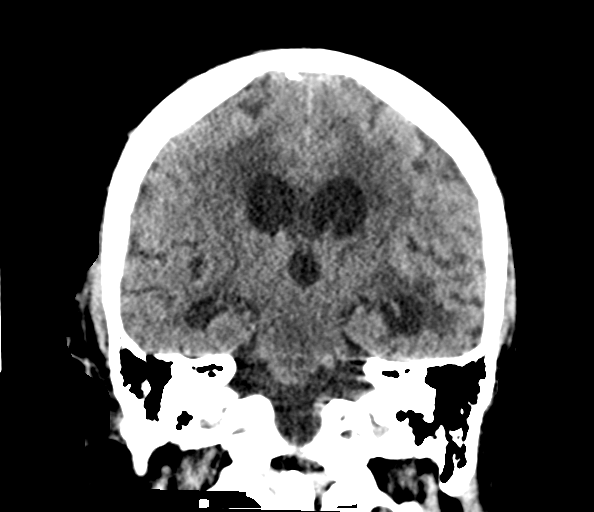
[im 41/73  brain]
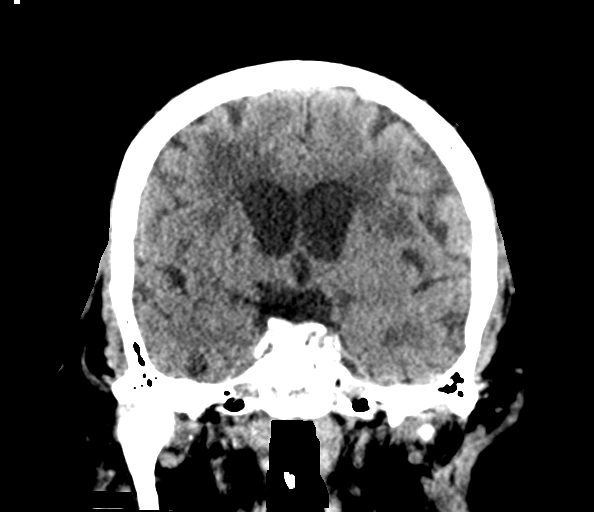

[Series 6: head without sag · sagittal · non-contrast · 0.35mm/px · 3 of 58 slices shown]
[im 20/58  brain]
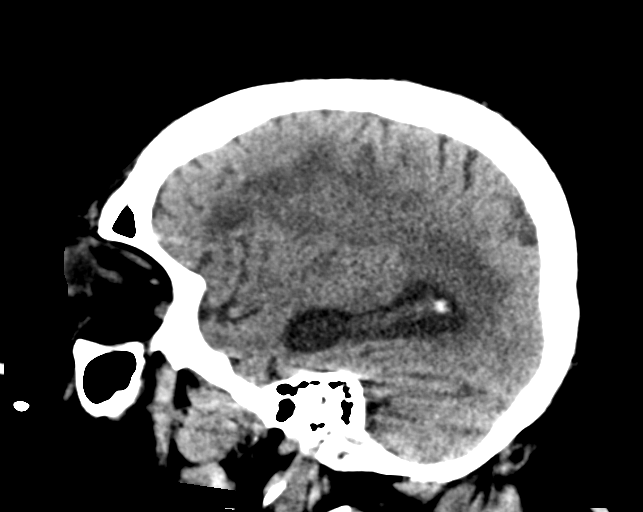
[im 29/58  brain]
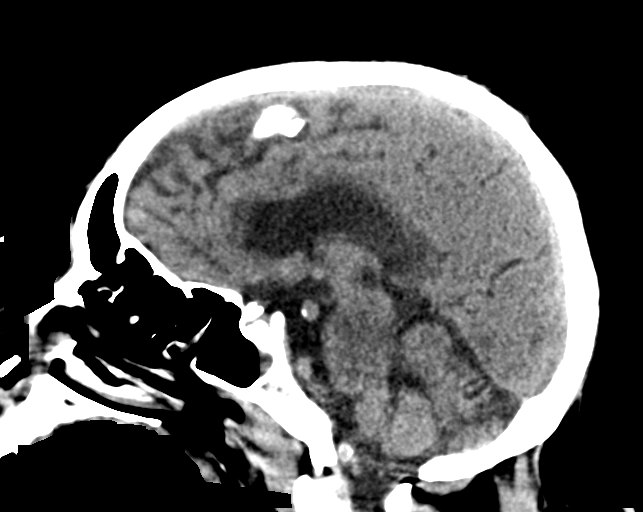
[im 39/58  brain]
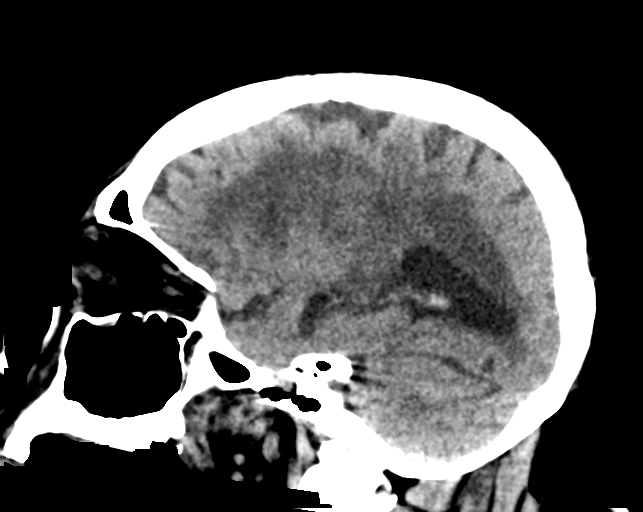

[16 of 47 positions shown; findings below may reference images not displayed]

FINDINGS: Brain: Trace residual intraventricular hemorrhage layering in the
occipital horns. Trace residual left convexity subarachnoid
hemorrhage is also possible. No new or increasing intracranial
blood. No midline shift, mass effect, or evidence of intracranial
mass lesion. Stable ventricle size and configuration. Confluent
bilateral cerebral white matter hypodensity and heterogeneous deep
gray nuclei appears stable. No acute cortically based infarct
identified. Small area of chronic cortical encephalomalacia at the
junction of the posterior left temporal and occipital lobes.

Vascular: Calcified atherosclerosis at the skull base. No suspicious
intracranial vascular hyperdensity.

Skull: Stable, intact.

Sinuses/Orbits: Improved paranasal sinus aeration. Small fluid level
in the right sphenoid sinus is stable. Trace mastoid effusions.
Tympanic cavities are stable.

Other: Right side enteric feeding tube remains. No acute orbit or
scalp soft tissue finding.
IMPRESSION: 1. Evidence of advanced chronic small vessel disease but no CT
findings of acute or evolving anoxic injury.

2. Trace residual intraventricular and subarachnoid hemorrhage,
decreased since last month. No new intracranial hemorrhage. No
intracranial mass effect.

## 2022-05-29 IMAGING — DX DG CHEST 1V PORT
1 series · 1 of 1 positions shown · non-contrast
Comparison: Chest radiograph February 06, 2020 and chest CT February 14, 2020

CLINICAL DATA: Hypoxia

EXAM:
PORTABLE CHEST 1 VIEW

[chest]
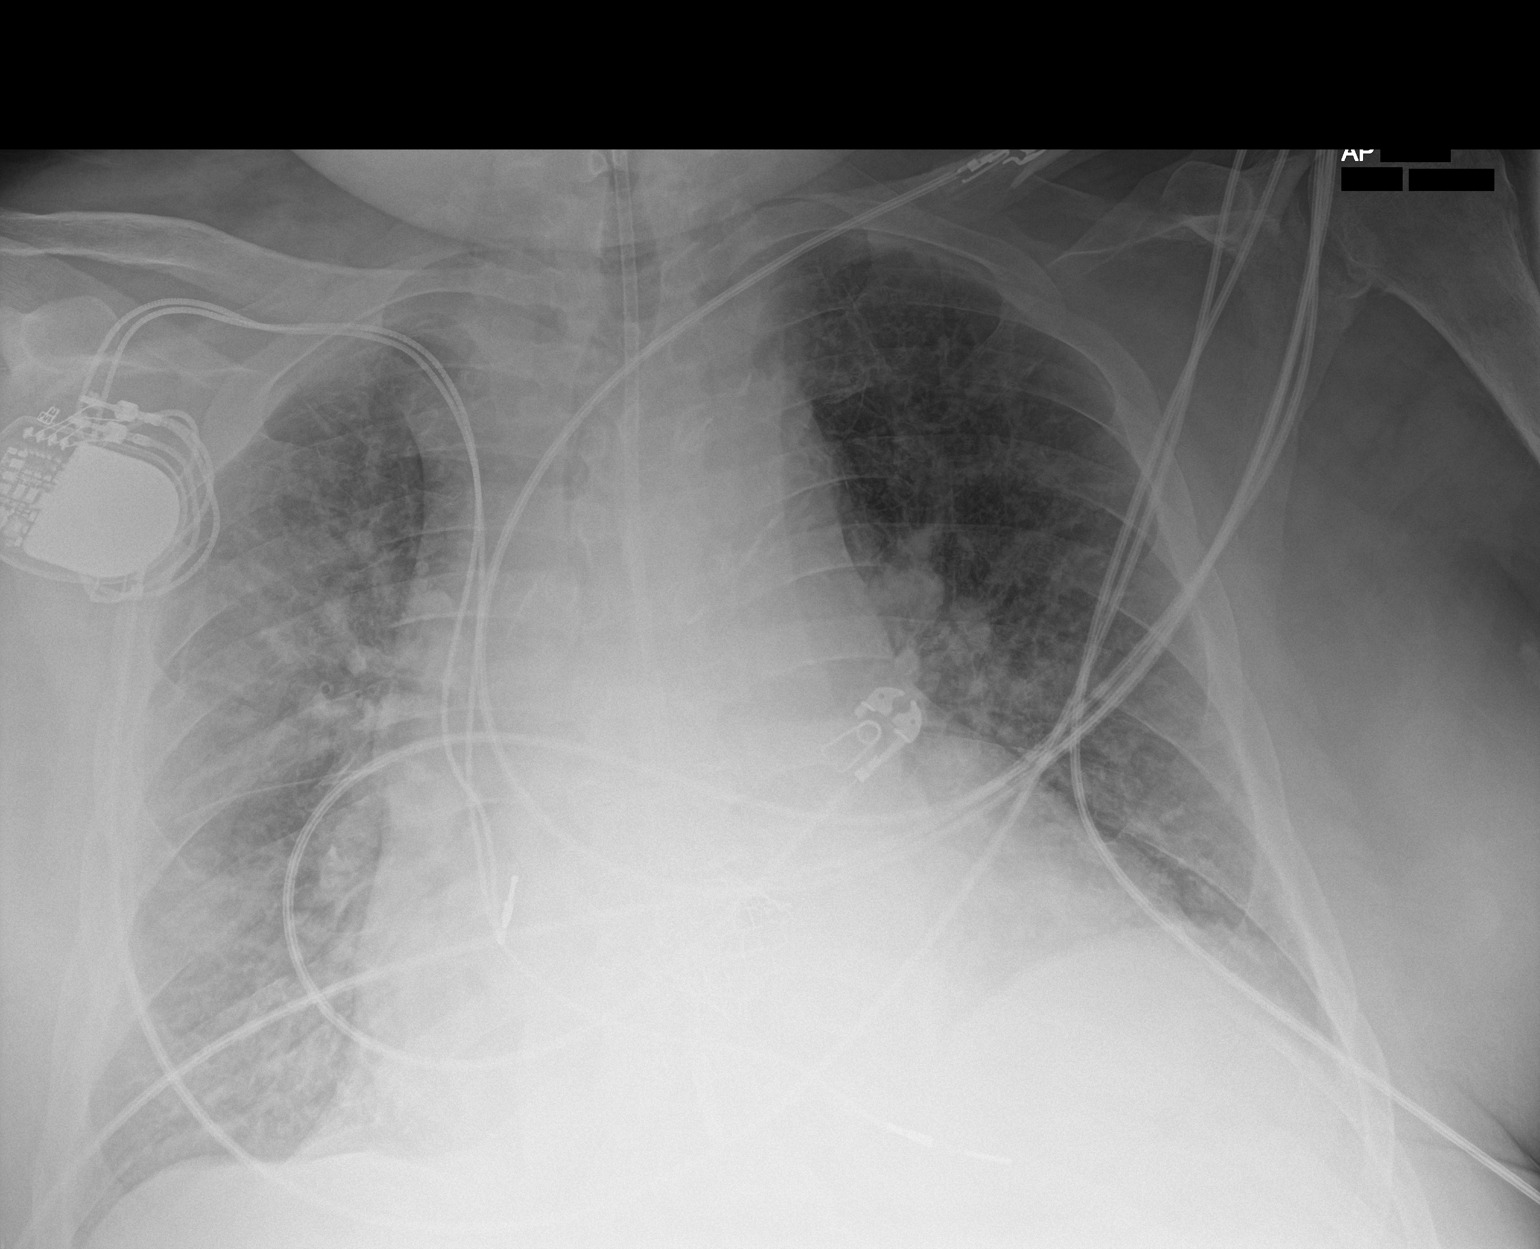

[1 of 1 positions shown; findings below may reference images not displayed]

FINDINGS: Feeding tube tip is below the diaphragm. There is cardiomegaly with
pulmonary venous hypertension. There is mild interstitial edema.
There is no airspace consolidation.

Pacemaker leads are attached to the right atrium and right
ventricle. No evident adenopathy. There is aortic atherosclerosis.
No bone lesions.
IMPRESSION: Cardiomegaly with pulmonary vascular congestion. There is a degree
of interstitial edema. Suspect a degree of congestive heart failure.
No consolidation.

Feeding tube tip below diaphragm. Pacemaker leads attached to right
atrium and right ventricle.

Aortic Atherosclerosis ([id number].O).

## 2022-06-01 IMAGING — DX DG ABD PORTABLE 1V
1 series · 1 of 1 positions shown · non-contrast
Comparison: 11/05/2013

CLINICAL DATA: Feeding tube placement.

EXAM:
PORTABLE ABDOMEN - 1 VIEW

[abdomen kub]
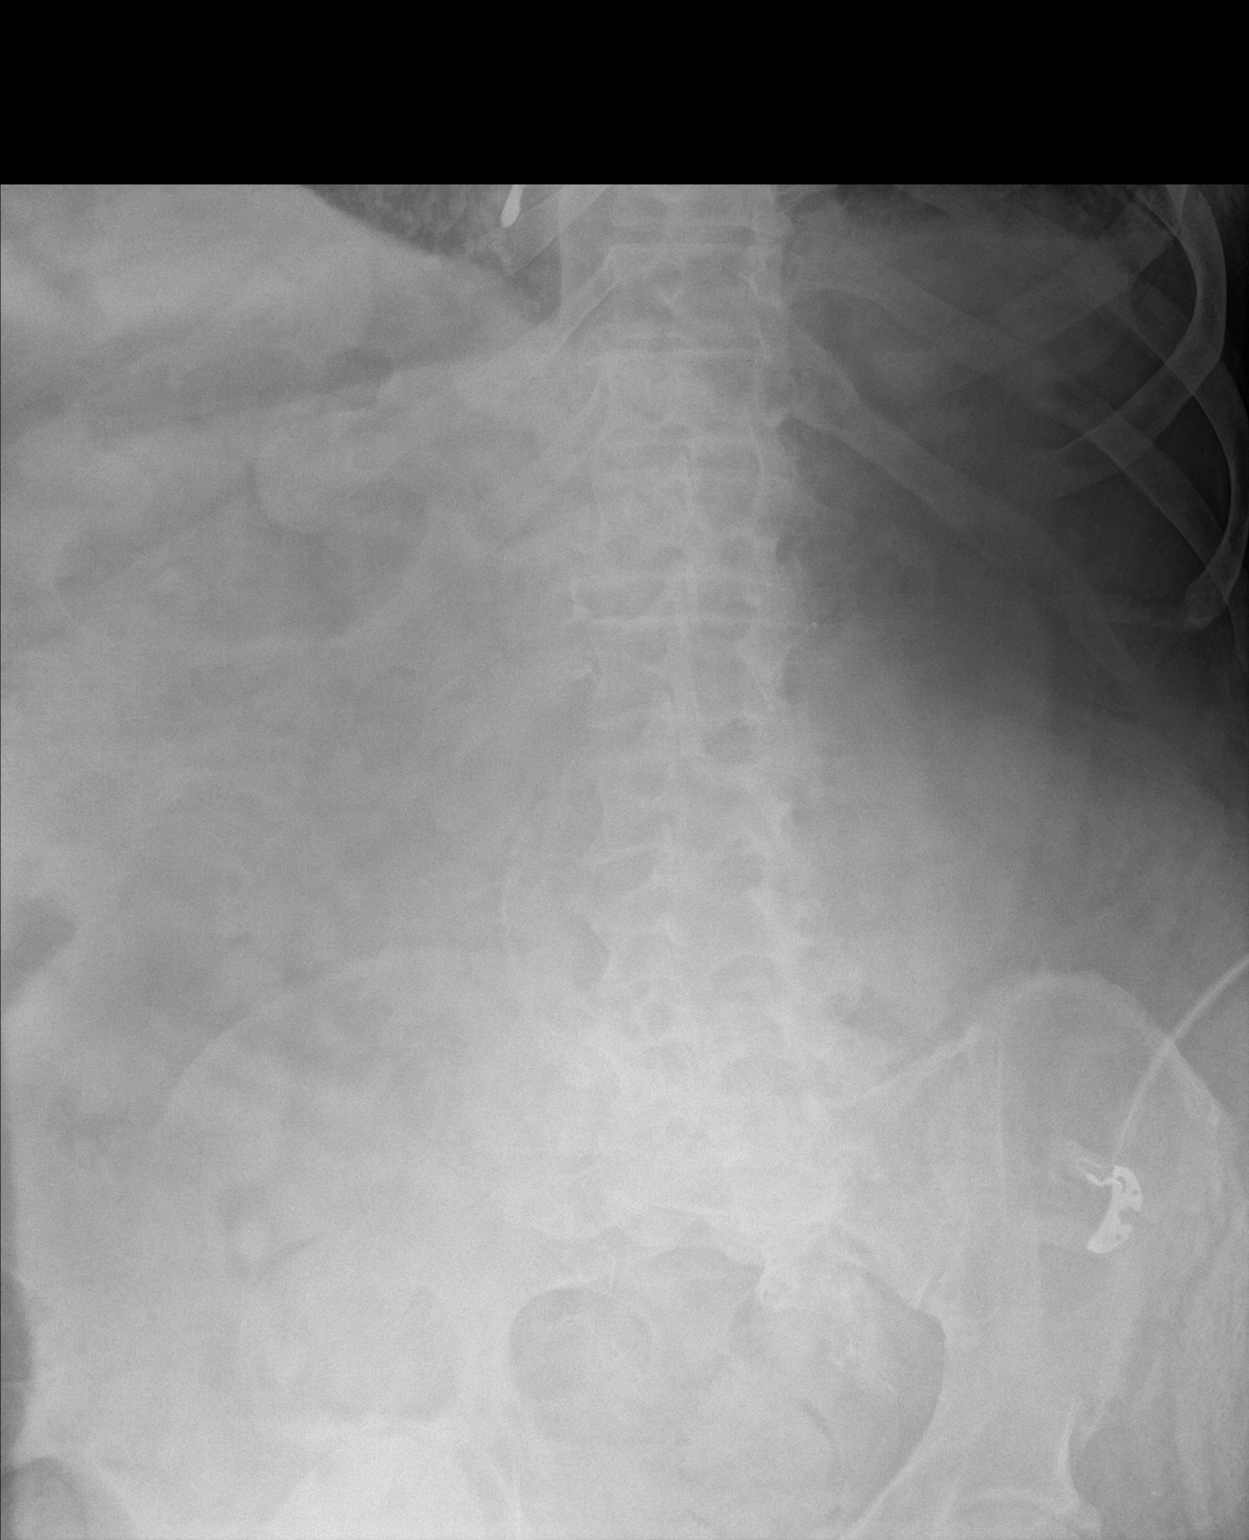

[1 of 1 positions shown; findings below may reference images not displayed]

FINDINGS: Feeding tube tip is partially imaged, projecting over the MEDIAL
RIGHT lung base. Tube tip may be within the RIGHT LOWER lobe or a
tortuous esophagus.

Visualized bowel gas pattern is nonobstructive.
IMPRESSION: 1. Feeding tube tip projecting over the MEDIAL RIGHT lung base.
Recommend removal and replacement.
2. Nonobstructive bowel gas pattern.

Critical Value/emergent results were called by telephone at the time
of interpretation on 02/18/2020 at [DATE] to provider Dr. Jahvel,
who verbally acknowledged these results.

## 2022-06-08 IMAGING — DX DG CHEST 1V PORT
1 series · 1 of 1 positions shown · non-contrast
Comparison: Chest radiograph 02/15/2020.  Chest CT 02/14/2020

CLINICAL DATA: Respiratory distress.

EXAM:
PORTABLE CHEST 1 VIEW

[chest]
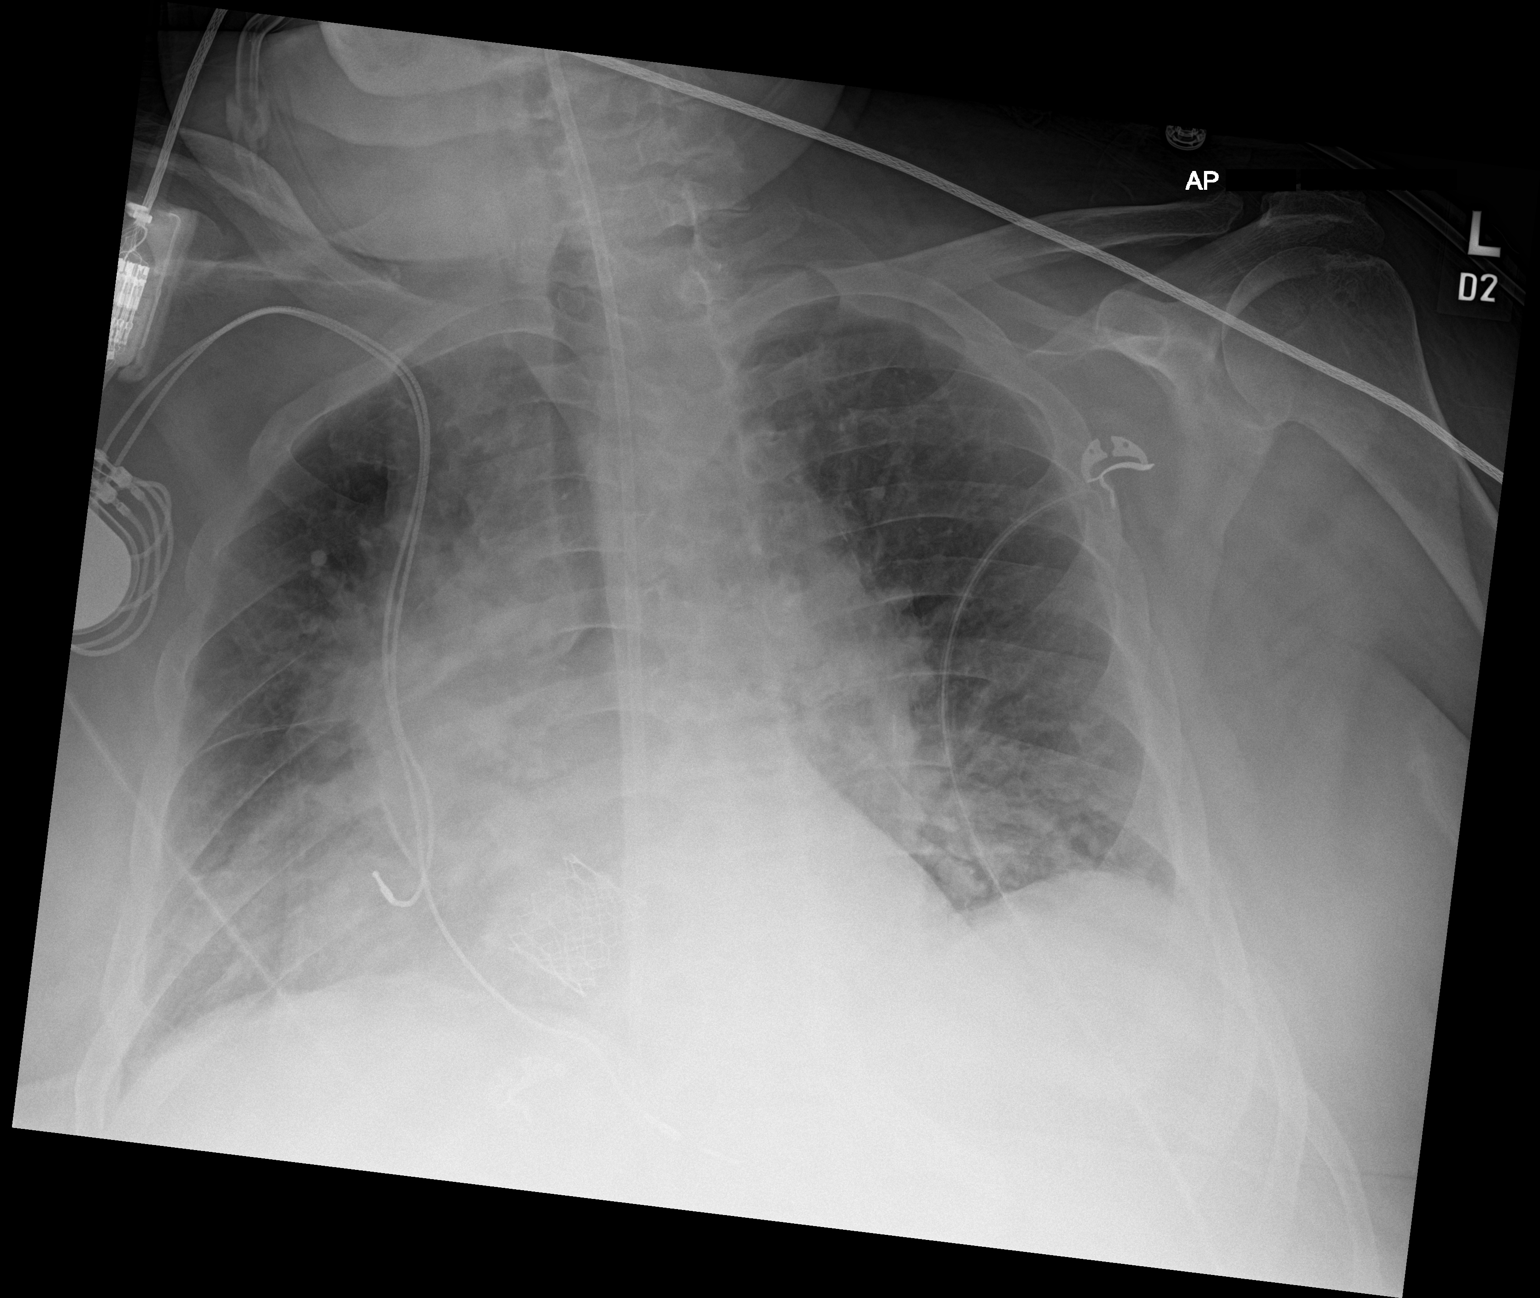

[1 of 1 positions shown; findings below may reference images not displayed]

FINDINGS: Weighted enteric tube tip below the diaphragm not included in the
field of view. Right-sided pacemaker in place. Prior aortic valve
replacement. Lower lung volumes from prior exam. Mild cardiomegaly
stable allowing for differences in technique and rotation. Grossly
unchanged mediastinal contours. Vascular congestion, similar.
Suspected small pleural effusions. Increasing patchy bibasilar
opacities.
IMPRESSION: 1. Lower lung volumes from prior exam with increasing patchy
bibasilar opacities, favoring atelectasis but nonspecific. Recommend
correlation for aspiration risk factors.
2. Stable cardiomegaly with suspected small pleural effusions and
vascular congestion.

## 2022-06-10 IMAGING — CT CT ABDOMEN W/O CM
2 of 4 series · 14 of 46 positions shown, 16 images · non-contrast
Comparison: 04/24/2019

CLINICAL DATA: Evaluate anatomy for a percutaneous gastrostomy tube
placement.

EXAM:
CT ABDOMEN WITHOUT CONTRAST
TECHNIQUE: Multidetector CT imaging of the abdomen was performed following the
standard protocol without IV contrast.

[Series 3: ap without · axial · non-contrast · 0.98mm/px · z∈[-105,+160]mm · 11 of 63 slices shown, 13 images]
[im 5/63  soft-tissue]
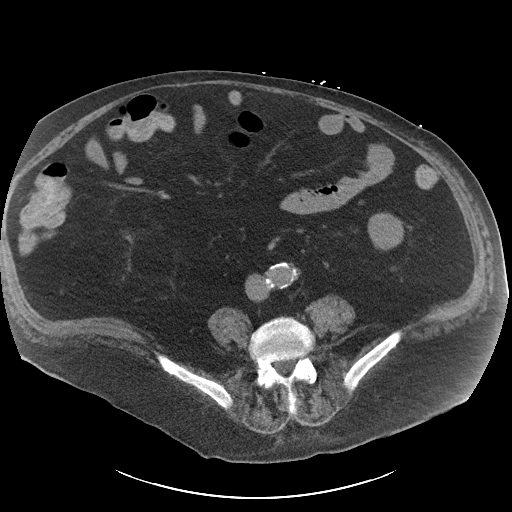
[im 5/63  bone]
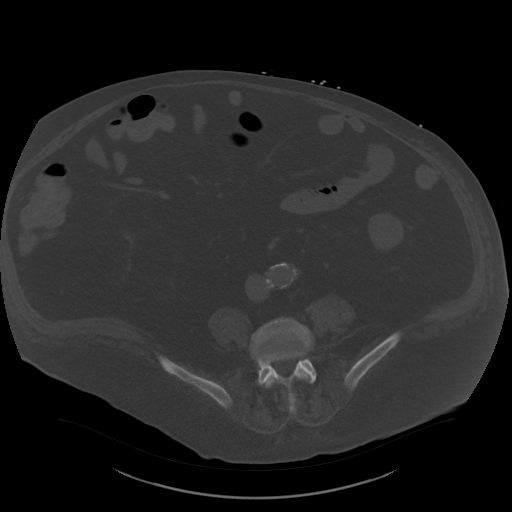
[im 9/63  soft-tissue]
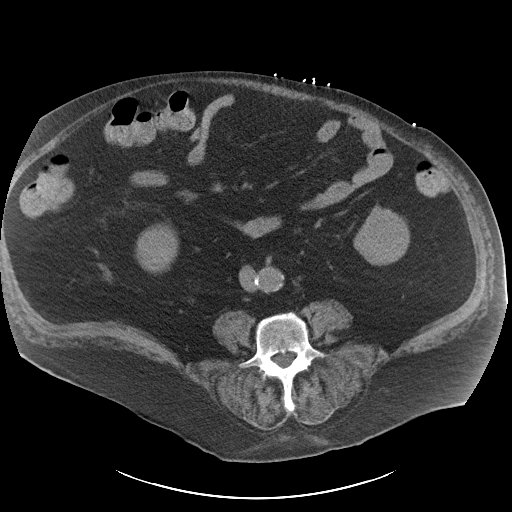
[im 17/63  soft-tissue]
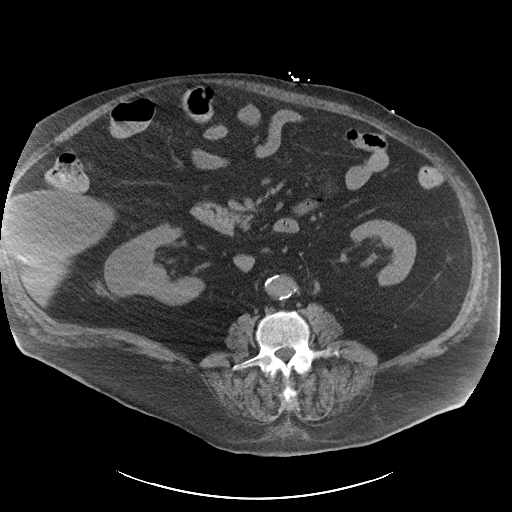
[im 21/63  soft-tissue]
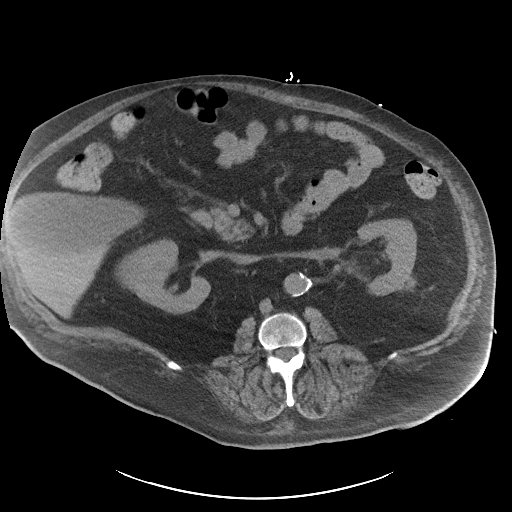
[im 25/63  soft-tissue]
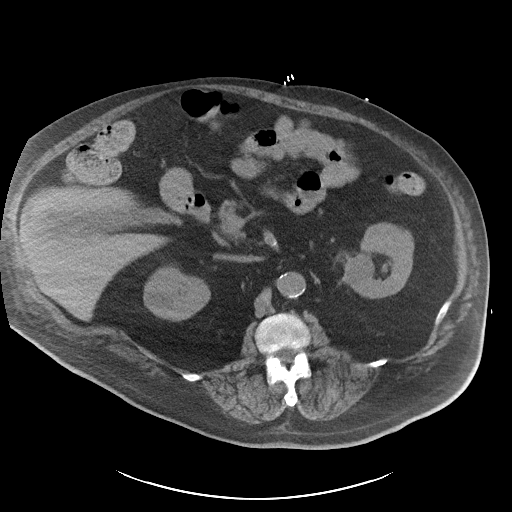
[im 34/63  soft-tissue]
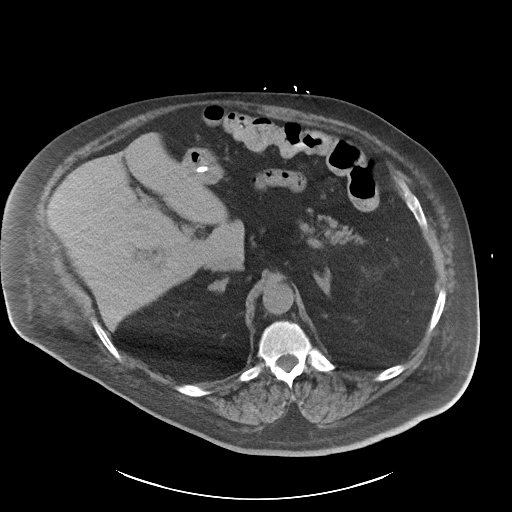
[im 38/63  soft-tissue]
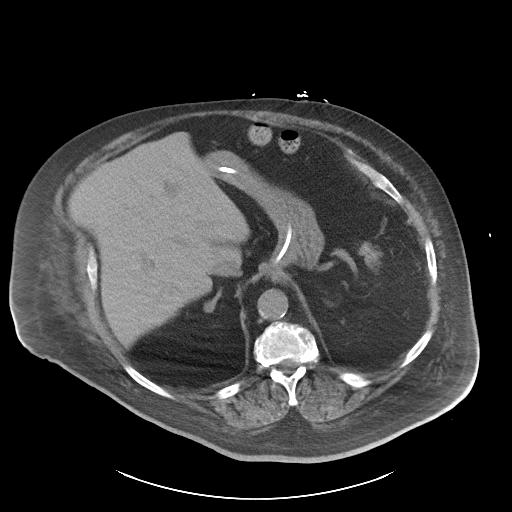
[im 42/63  soft-tissue]
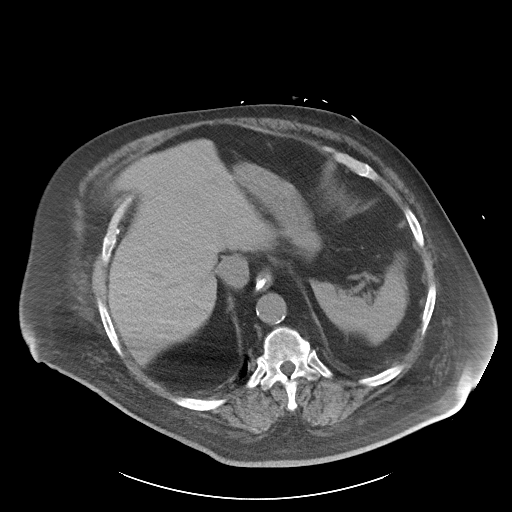
[im 46/63  soft-tissue]
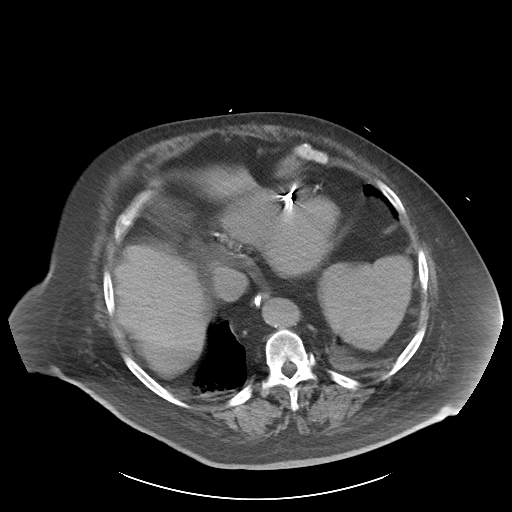
[im 46/63  bone]
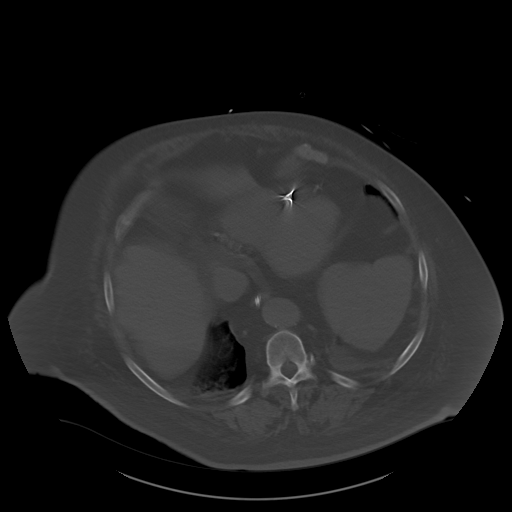
[im 54/63  soft-tissue]
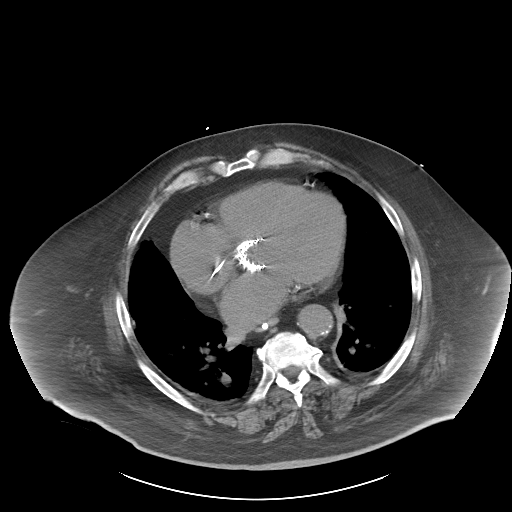
[im 58/63  soft-tissue]
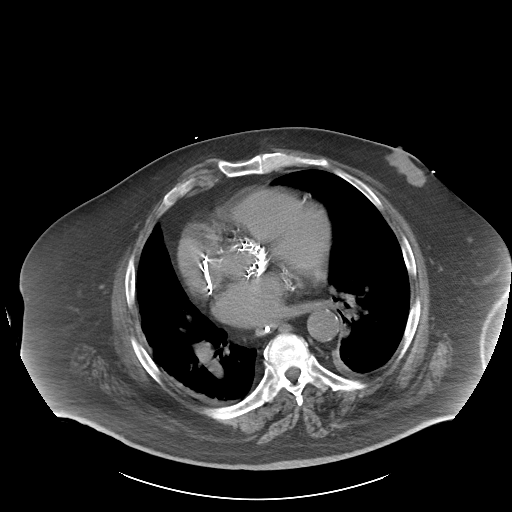

[Series 5: cor · coronal · 0.64mm/px · 3 of 132 slices shown]
[im 44/132  soft-tissue]
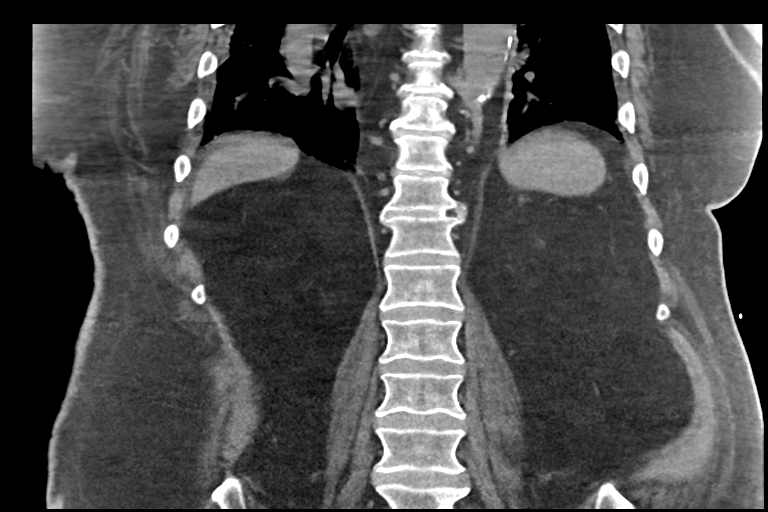
[im 59/132  soft-tissue]
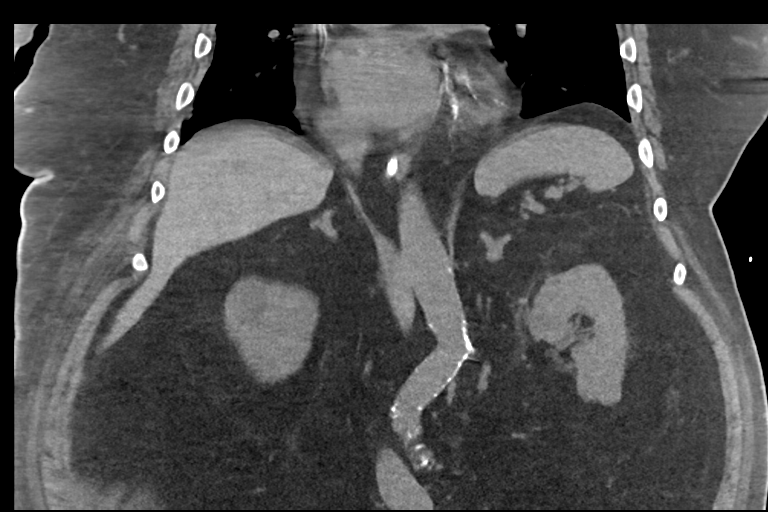
[im 73/132  soft-tissue]
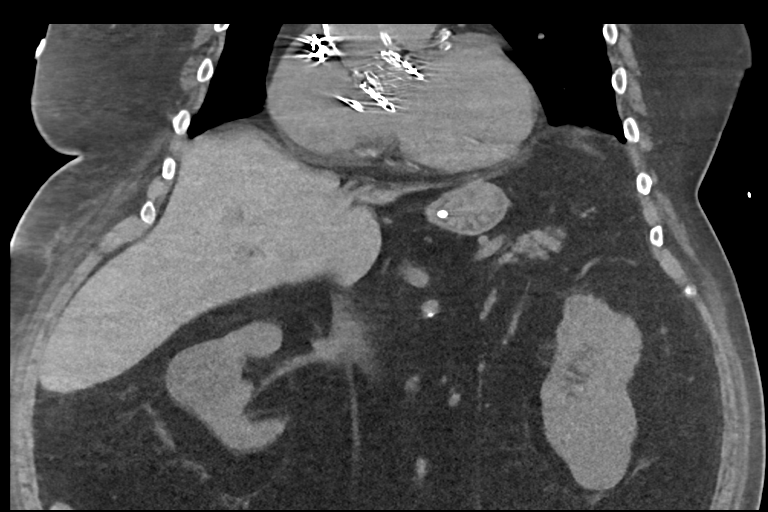

[14 of 46 positions shown; findings below may reference images not displayed]

FINDINGS: Lower chest: Small amount of dependent atelectasis at the lung
bases. No large pleural effusions. Patient has pacer leads in the
right atrium and right ventricle. Previous TAVR procedure. Heart
size is slightly enlarged but stable. No significant pericardial
fluid.

Hepatobiliary: Gallbladder is moderately distended with layering
stones. Gallbladder findings are similar to the previous examination
and no evidence for acute gallbladder inflammation. No acute
abnormality to the liver.

Pancreas: Unremarkable. No pancreatic ductal dilatation or
surrounding inflammatory changes.

Spleen: Normal in size without focal abnormality.

Adrenals/Urinary Tract: Mild nodularity in the left adrenal gland is
unchanged. Normal appearance of the right adrenal gland. Again noted
are bilateral renal cysts. Lateral exophytic cyst in the right
kidney measures up to 5.4 cm and previously measured 4.7 cm. This
right lateral renal cyst is slightly hyperdense measuring 27
Hounsfield units. Hyperdense posterior exophytic structure in the
left kidney lower pole measures roughly 1.0 cm and minimally changed
in size. Again noted is a hyperdense cystic structure along the left
kidney lower pole that measures roughly up to 3.9 cm and the
Hounsfield units are 41. This left kidney lower pole structure has
not significantly changed in size. There appears to be an enlarged
exophytic structure involving the posterior left kidney on sequence
3, image 42 that measures roughly 9 mm. This structure is
hyperdense. No evidence for hydronephrosis.

Stomach/Bowel: Nasogastric tube terminates in the region of the
duodenal bulb. The transverse colon is slightly caudal to the
stomach and there is should be a percutaneous window for gastrostomy
tube placement. No evidence for bowel inflammation or dilatation in
the abdomen.

Vascular/Lymphatic: Aorta and visceral arteries are heavily
calcified without aneurysm.

Other: Negative for ascites.  Negative for free intraperitoneal air.

Musculoskeletal: No acute bone abnormality.
IMPRESSION: 1. Anatomy should be amendable for percutaneous gastrostomy tube
placement.
2. No acute abnormality in the abdomen.
3. Bilateral renal structures are suggestive for hemorrhagic and/or
proteinaceous cysts. An enlarged hemorrhagic or proteinaceous cyst
in the posterior left kidney measuring up to 9 mm.
4. Moderate gallbladder distention with cholelithiasis. Stable
appearance of the gallbladder.
5.  Aortic Atherosclerosis ([id number]-JTQ.Q).

## 2022-06-15 IMAGING — XA IR PERC PLACEMENT GASTROSTOMY
3 series · 11 of 11 positions shown · non-contrast
Comparison: none

INDICATION: Acute cephalopathy, dysphagia

[Series 1: fl (-) angio · 2 acquisitions, 5 frames shown (1 of 3)]
[im 1/2]
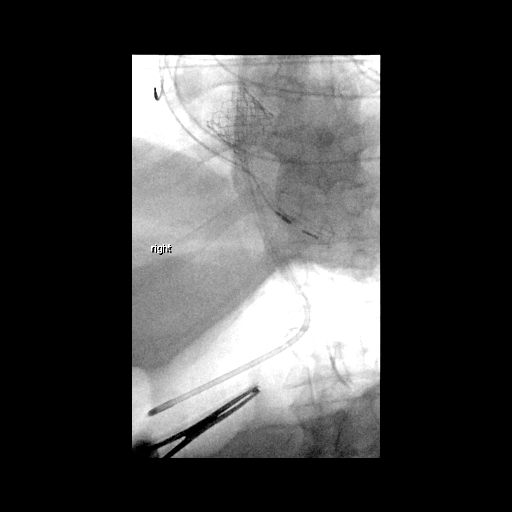
[im 1/2]
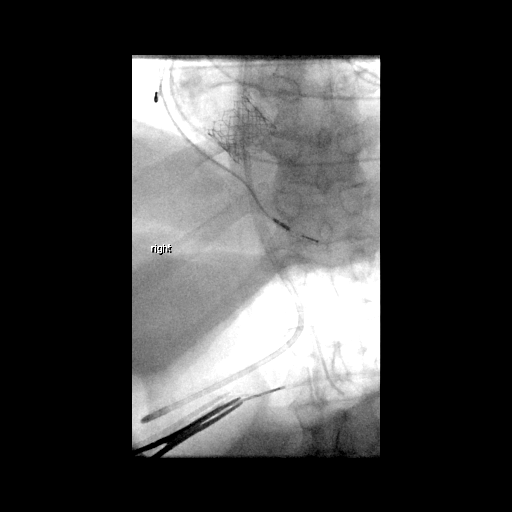
[im 1/2]
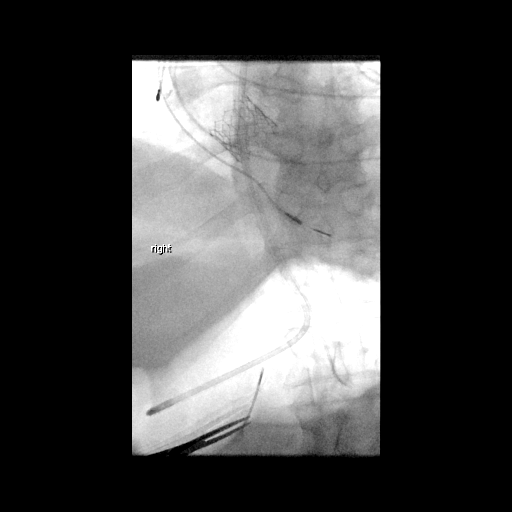
[im 1/2]
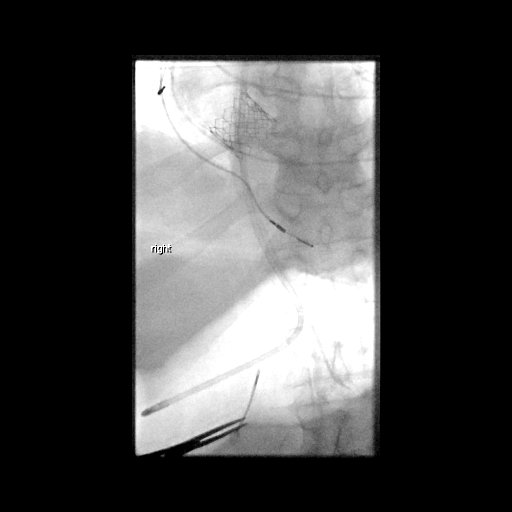
[im 2/2]
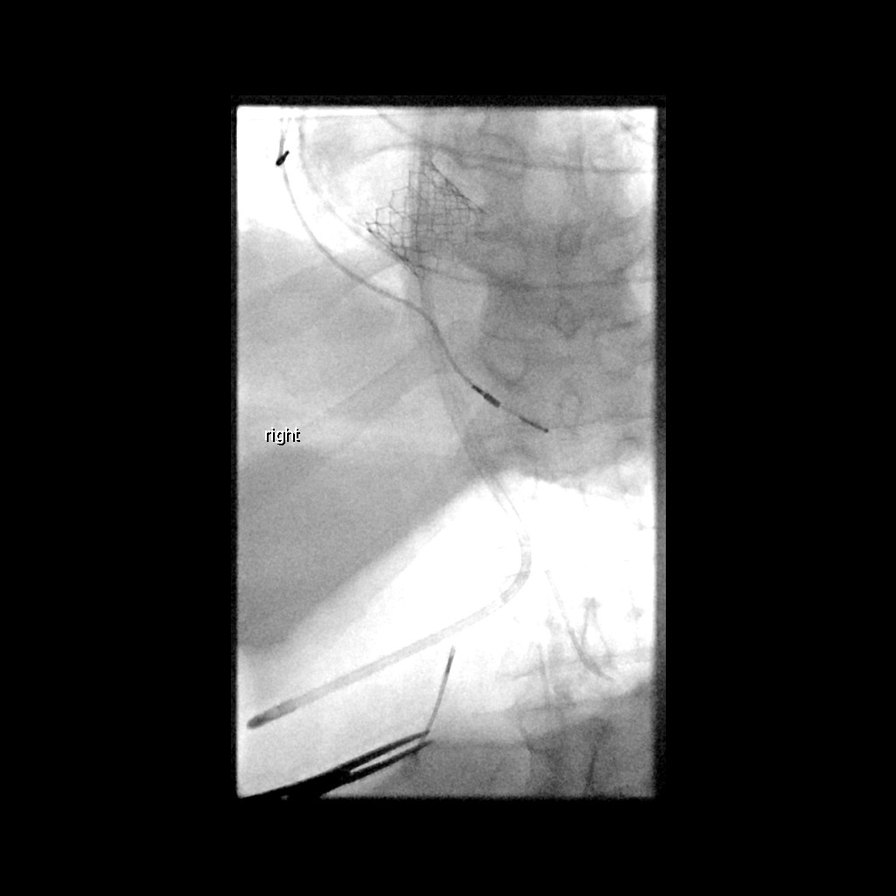

[Series 2: fl (-) angio · 2 acquisitions, 5 frames shown (2 of 3)]
[im 1/2]
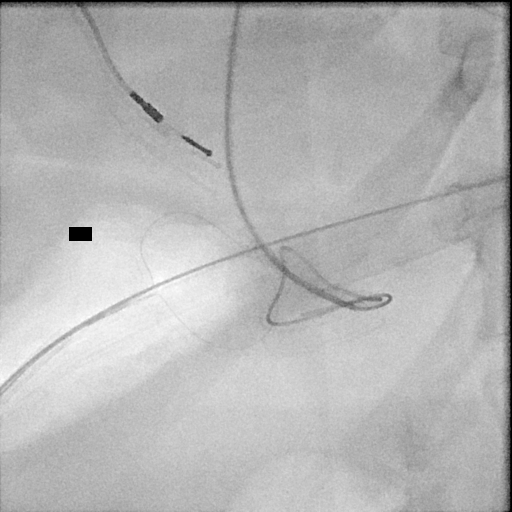
[im 1/2]
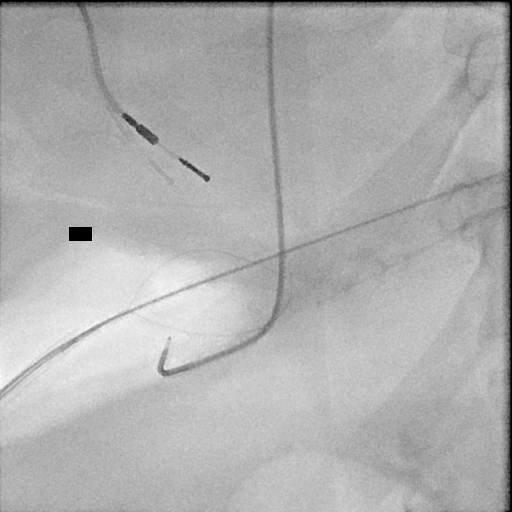
[im 1/2]
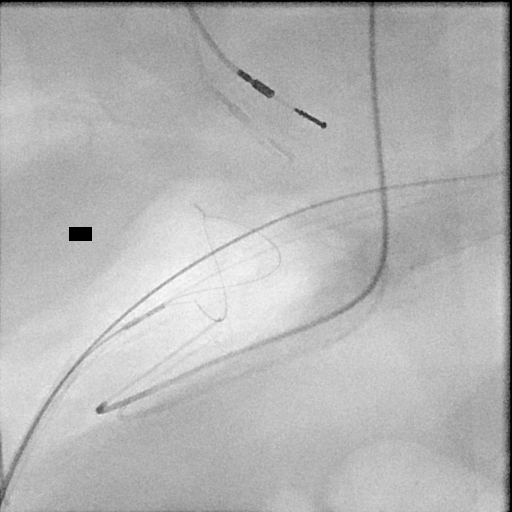
[im 1/2]
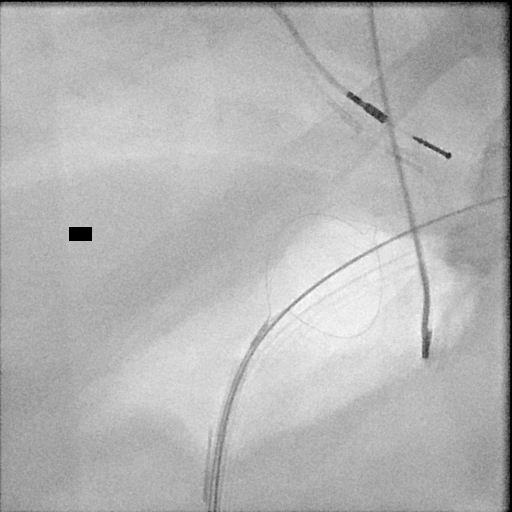
[im 2/2]
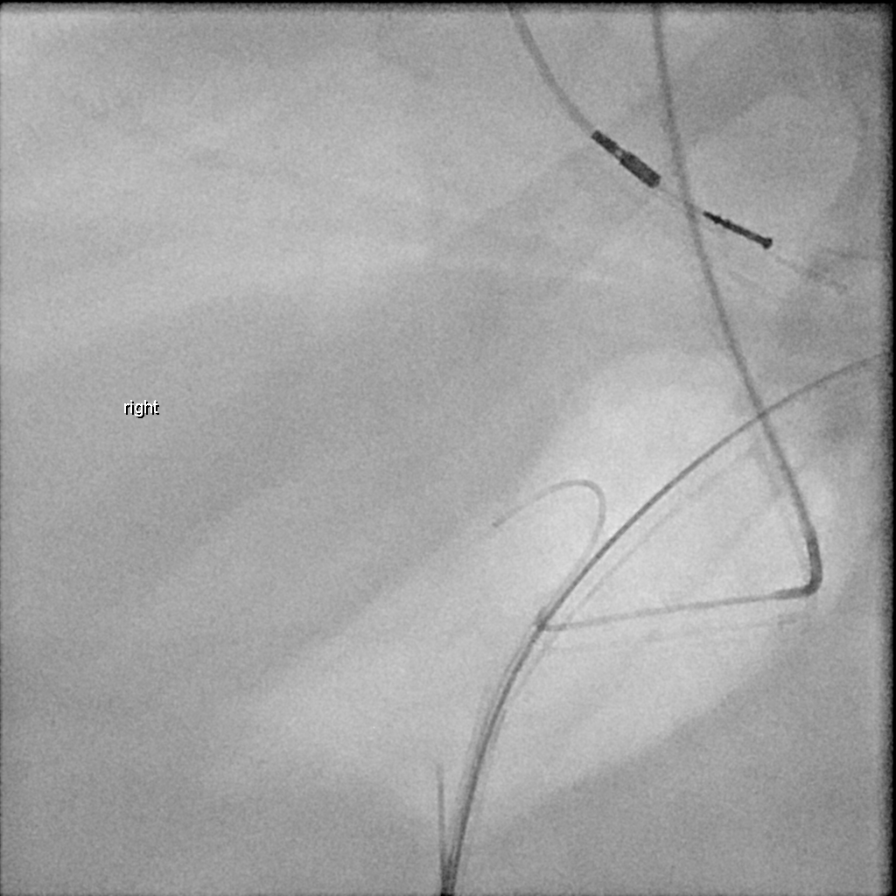

[Series 3: fl (-) angio · 1 of 1 slices shown (3 of 3)]
[im 1/1]
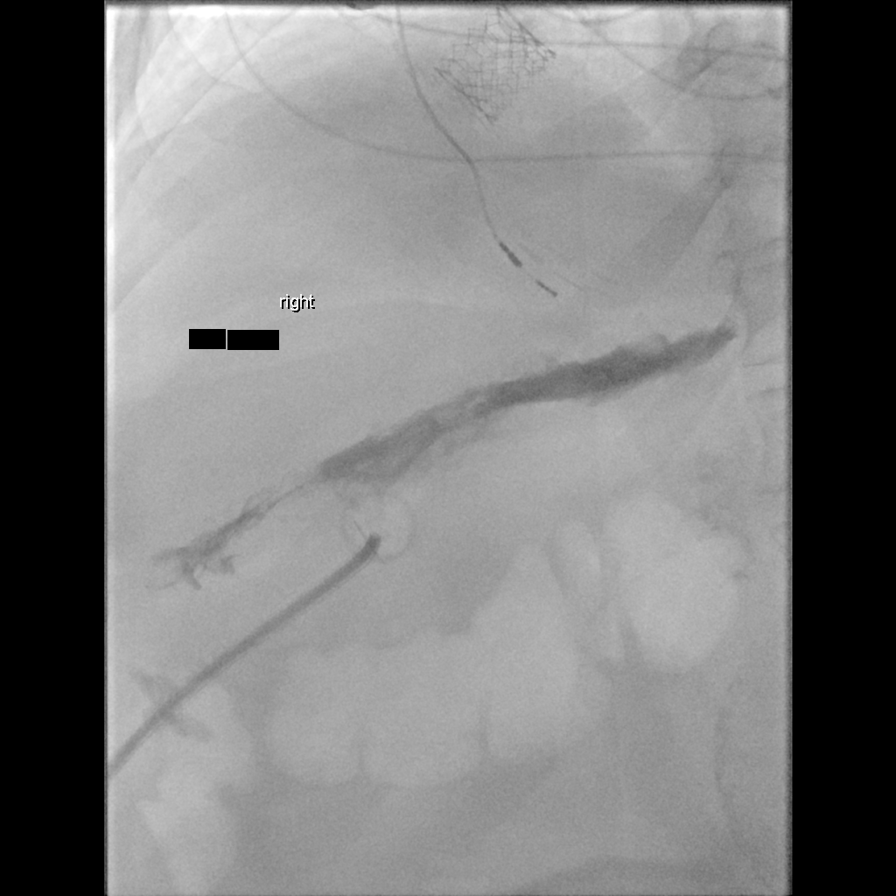

[11 of 11 positions shown; findings below may reference images not displayed]

EXAM:
FLUOROSCOPIC 20 FRENCH PULL-THROUGH GASTROSTOMY

Radiologist:  Ernst, Min

Guidance:  Fluoroscopic

MEDICATIONS:
Ancef 2 g; Antibiotics were administered within 1 hour of the
procedure. Glucagon 0.5 mg IV

ANESTHESIA/SEDATION:
Versed 1.5 mg IV; Fentanyl 75 mcg IV

Moderate Sedation Time:  19 minutes

The patient was continuously monitored during the procedure by the
interventional radiology nurse under my direct supervision.

CONTRAST:  10mL OMNIPAQUE IOHEXOL 300 MG/ML SOLN - administered into
the gastric lumen.

FLUOROSCOPY TIME:  Fluoroscopy Time: 8 minutes 54 seconds (421 mGy).

COMPLICATIONS:
None immediate.

PROCEDURE:
Informed consent was obtained from the patient's family following
explanation of the procedure, risks, benefits and alternatives. The
patient understands, agrees and consents for the procedure. All
questions were addressed. A time out was performed.

Maximal barrier sterile technique utilized including caps, mask,
sterile gowns, sterile gloves, large sterile drape, hand hygiene,
and betadine prep.

The left upper quadrant was sterilely prepped and draped. An oral
gastric catheter was inserted into the stomach under fluoroscopy.
The existing nasogastric feeding tube was removed. Air was injected
into the stomach for insufflation and visualization under
fluoroscopy. The air distended stomach was confirmed beneath the
anterior abdominal wall in the frontal and lateral projections.
Under sterile conditions and local anesthesia, a 17 gauge trocar
needle was utilized to access the stomach percutaneously beneath the
left subcostal margin. Needle position was confirmed within the
stomach under biplane fluoroscopy. Contrast injection confirmed
position also. A single T tack was deployed for gastropexy. Over an
Amplatz guide wire, a 9-French sheath was inserted into the stomach.
A snare device was utilized to capture the oral gastric catheter.
The snare device was pulled retrograde from the stomach up the
esophagus and out the oropharynx. The 20-French pull-through
gastrostomy was connected to the snare device and pulled antegrade
through the oropharynx down the esophagus into the stomach and then
through the percutaneous tract external to the patient. The
gastrostomy was assembled externally. Contrast injection confirms
position in the stomach. Images were obtained for documentation. The
patient tolerated procedure well. No immediate complication.
IMPRESSION: Fluoroscopic insertion of a 20-French "pull-through" gastrostomy.

## 2022-07-09 IMAGING — CT CT CHEST W/O CM
2 of 4 series · 13 of 36 positions shown, 16 images · non-contrast
Comparison: CT 02/14/2020

CLINICAL DATA: Cough and history of aspiration pneumonia, assess
for aspiration

EXAM:
CT CHEST WITHOUT CONTRAST
TECHNIQUE: Multidetector CT imaging of the chest was performed following the
standard protocol without IV contrast.

[Series 4: chest wo · axial · 0.98mm/px · z∈[-490,-234]mm · 10 of 152 slices shown, 13 images]
[im 12/152  mediastinal]
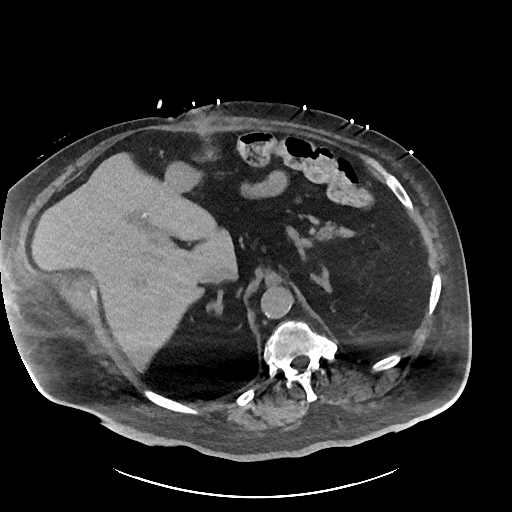
[im 12/152  lung]
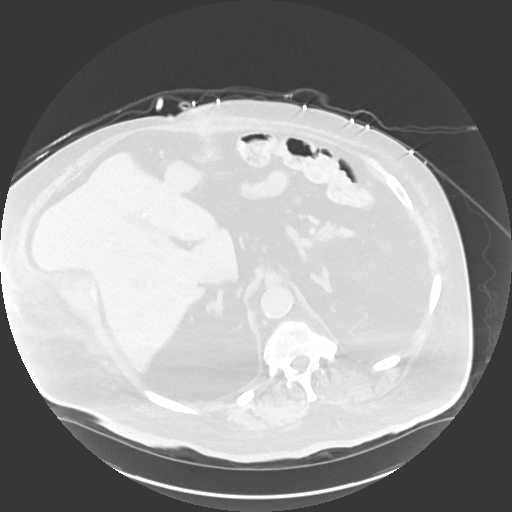
[im 24/152  lung]
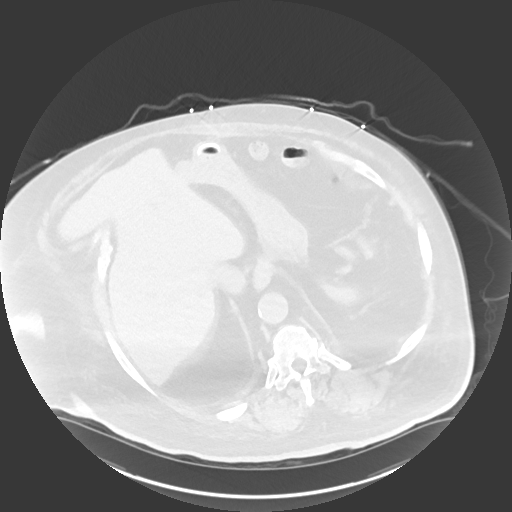
[im 47/152  lung]
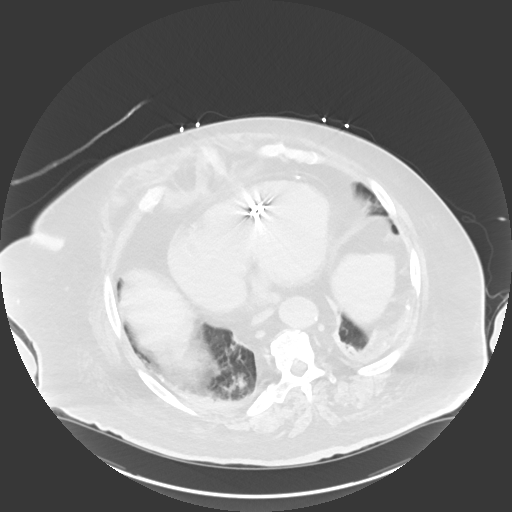
[im 59/152  lung]
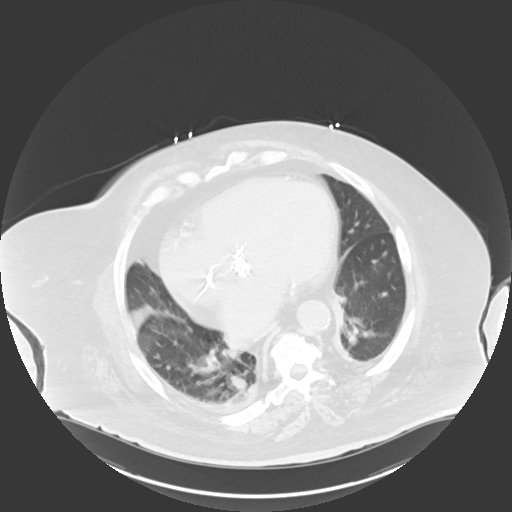
[im 70/152  mediastinal]
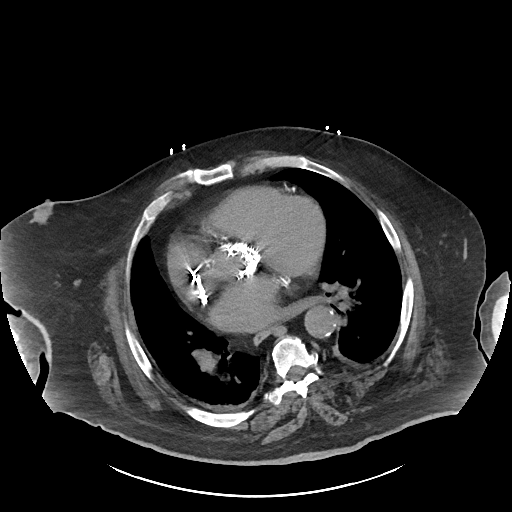
[im 70/152  lung]
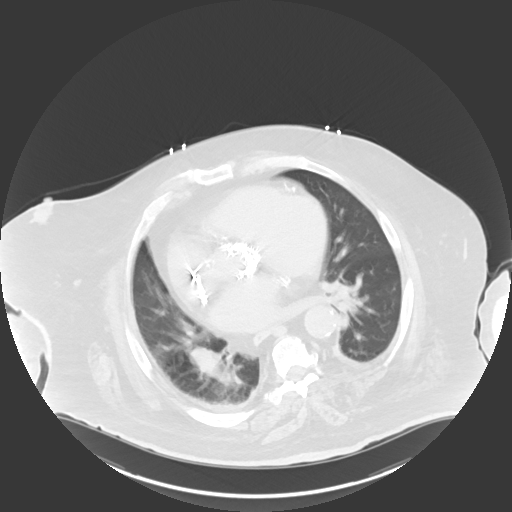
[im 82/152  lung]
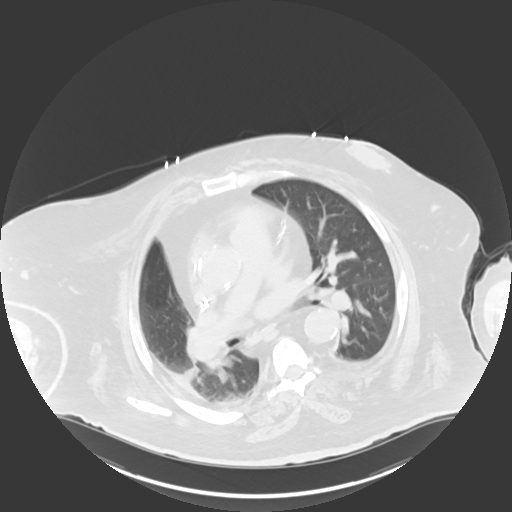
[im 93/152  lung]
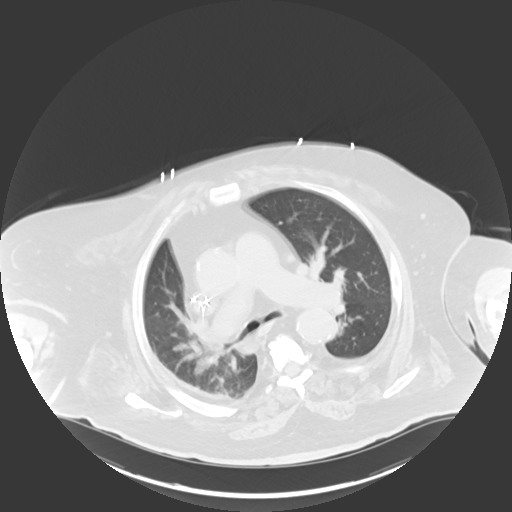
[im 117/152  lung]
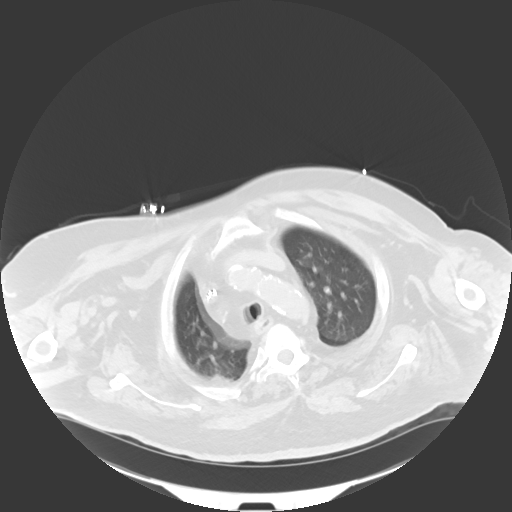
[im 128/152  mediastinal]
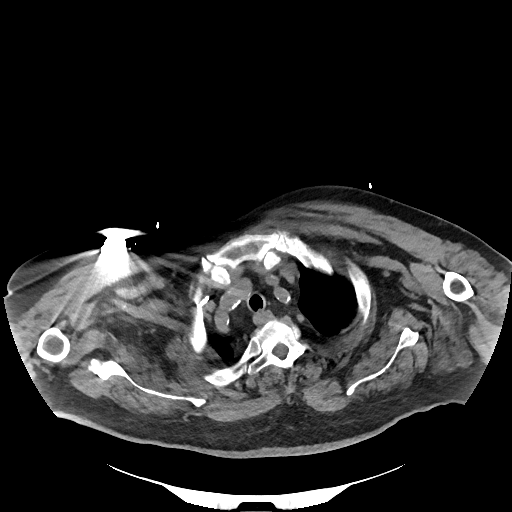
[im 128/152  lung]
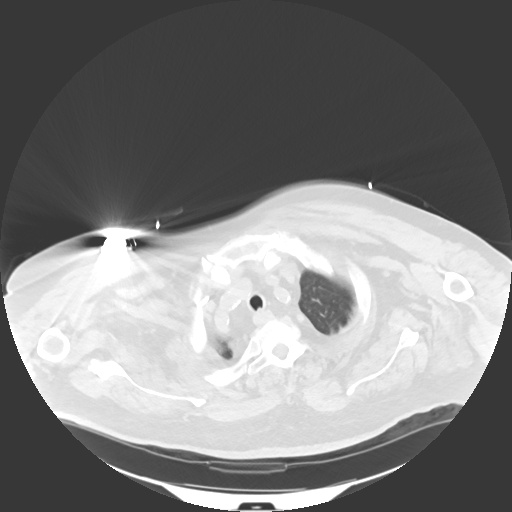
[im 140/152  lung]
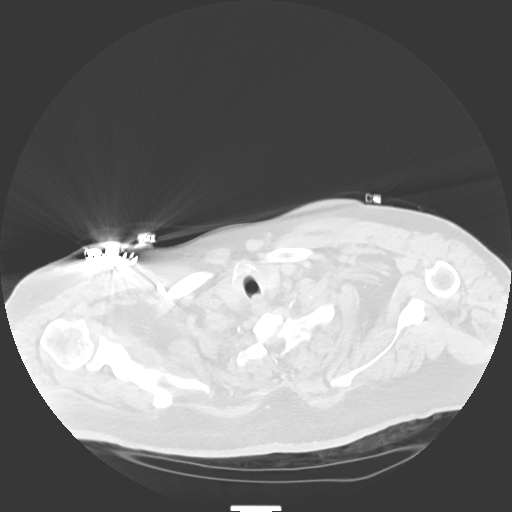

[Series 7: cor · coronal · 0.59mm/px · 3 of 181 slices shown]
[im 37/181  lung]
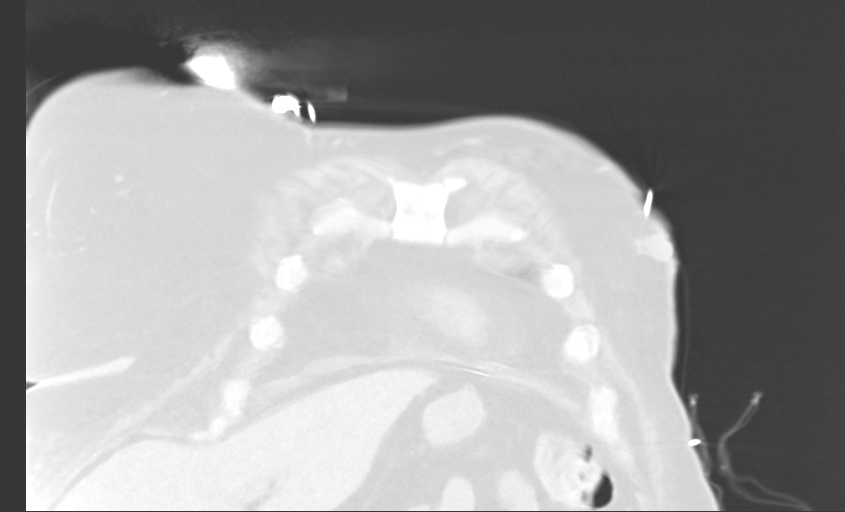
[im 73/181  lung]
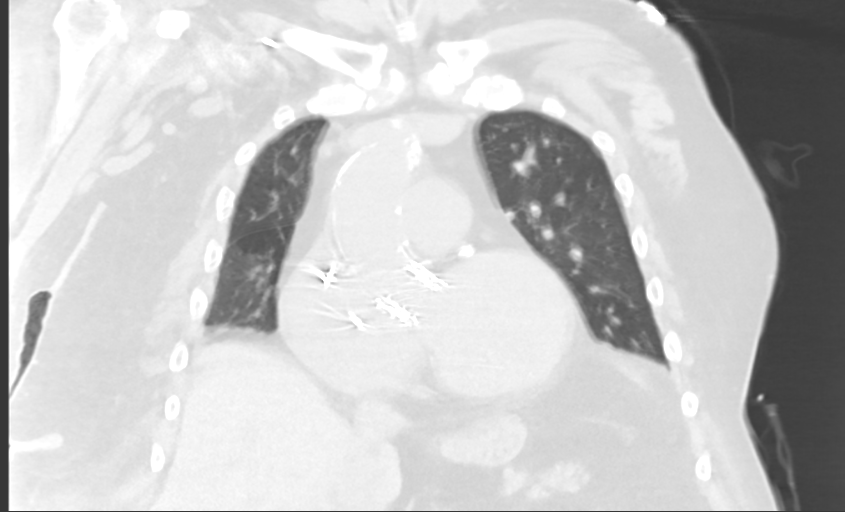
[im 109/181  lung]
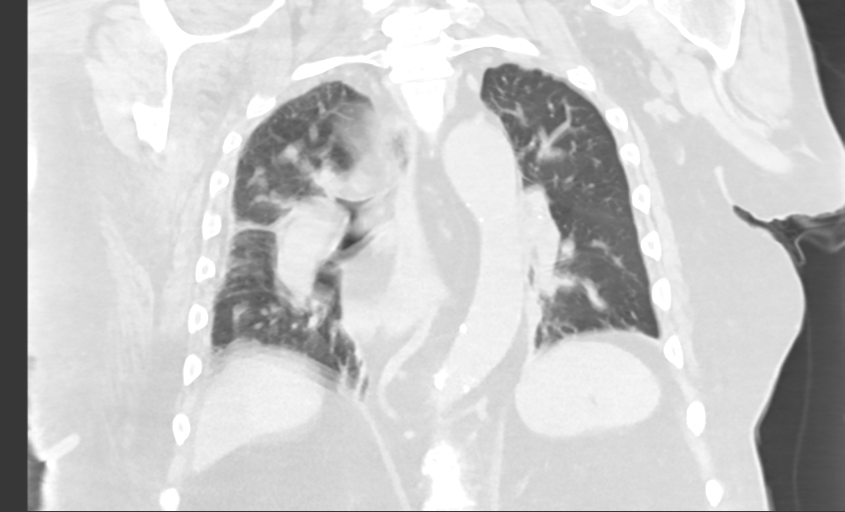

[13 of 36 positions shown; findings below may reference images not displayed]

FINDINGS: Cardiovascular: There is cardiomegaly with predominantly right heart
enlargement. Coronary artery calcifications are present.
Transcatheter aortic valve replacement in stable positioning. A
right chest wall battery pack is present with leads at the right
atrium and cardiac apex. No pericardial effusion. Atherosclerotic
plaque within the normal caliber aorta. Central pulmonary arteries
are borderline enlarged. Luminal evaluation of the vasculature
precluded in the absence of contrast media.

Mediastinum/Nodes: No mediastinal fluid or gas. Normal thyroid gland
and thoracic inlet. No acute abnormality of the trachea or
esophagus. No worrisome mediastinal or axillary adenopathy. Few
prominent mediastinal nodes are similar to comparison is. Hilar
nodal evaluation is limited in the absence of intravenous contrast
media.

Lungs/Pleura: Evaluation of the lung parenchyma limited by extensive
respiratory motion artifact. There is asymmetric, right greater than
left dependent atelectatic changes posteriorly. No residual
effusions are present. There is marked central pulmonary vascular
congestion and redistribution cephalized vessels as well as some
mild fissural thickening. Minimal if any significant interlobular
septal thickening is present. No pneumothorax. No focal
consolidative process is seen.

Upper Abdomen: Percutaneous gastrostomy tube in the left upper
quadrant appears appropriately positioned with the balloon in the
gastric lumen towards the antrum. No acute abnormalities present in
the visualized portions of the upper abdomen.

Musculoskeletal: Mild body wall edema. Bilateral gynecomastia, left
greater than right. No worrisome chest wall lesions. Multilevel
degenerative changes are present in the imaged portions of the
spine. No acute osseous abnormality or suspicious osseous lesion.
IMPRESSION: 1. Limited evaluation of the lung parenchyma limited by extensive
respiratory motion artifact.
2. Some atelectatic changes are present in the lung bases, right
greater than left
3. No focal consolidative opacity to suggest an aspiration pneumonia
at this time.
4. Marked central pulmonary vascular congestion and redistribution
cephalized vessels as well as some mild fissural thickening. Minimal
if any significant interlobular septal thickening. Could reflect
some mild interstitial edema/features of CHF.
5. Cardiomegaly with predominantly right heart enlargement. Coronary
artery calcifications are present. Please note that the presence of
coronary artery calcium documents the presence of coronary artery
disease, the severity of this disease and any potential stenosis
cannot be assessed on this non-gated CT examination.
6. Aortic Atherosclerosis ([id number]).

## 2022-07-09 IMAGING — CT CT HEAD W/O CM
3 series · 17 of 37 positions shown, 19 images · non-contrast
Comparison: 02/15/2020

CLINICAL DATA: Mental status changes

EXAM:
CT HEAD WITHOUT CONTRAST
TECHNIQUE: Contiguous axial images were obtained from the base of the skull
through the vertex without intravenous contrast.

[Series 3: head wo · axial · 0.44mm/px · z∈[-109,+16]mm · 7 of 35 slices shown, 9 images]
[im 5/35  brain]
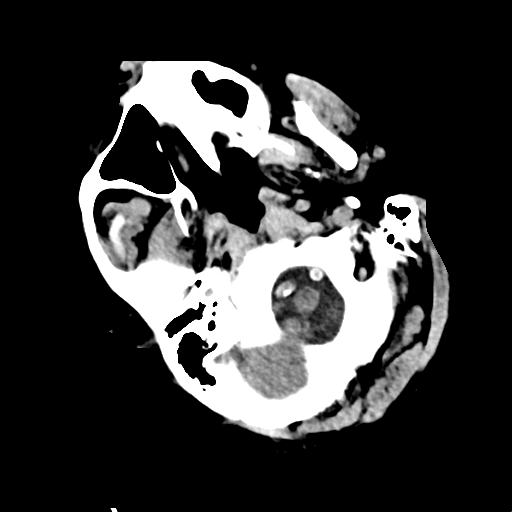
[im 5/35  bone]
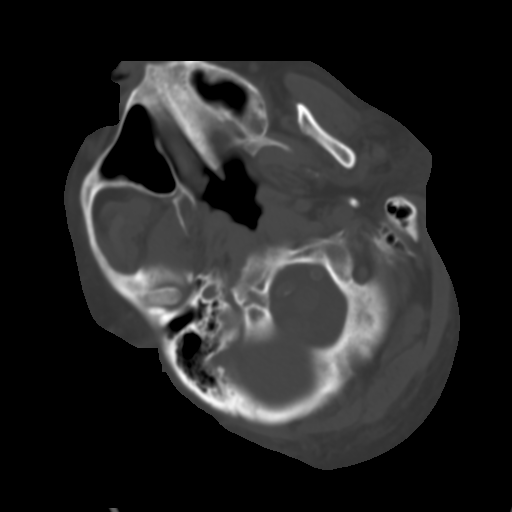
[im 9/35  brain]
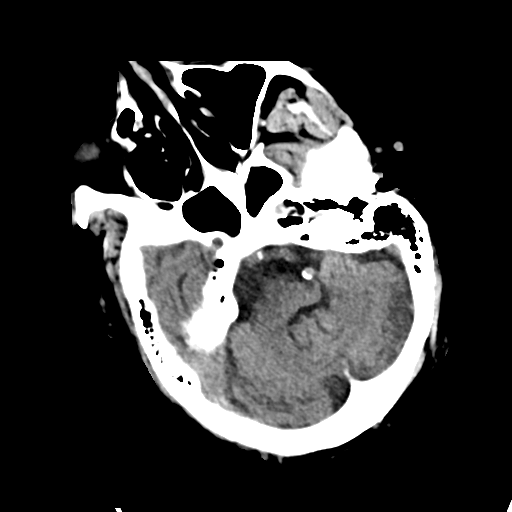
[im 13/35  brain]
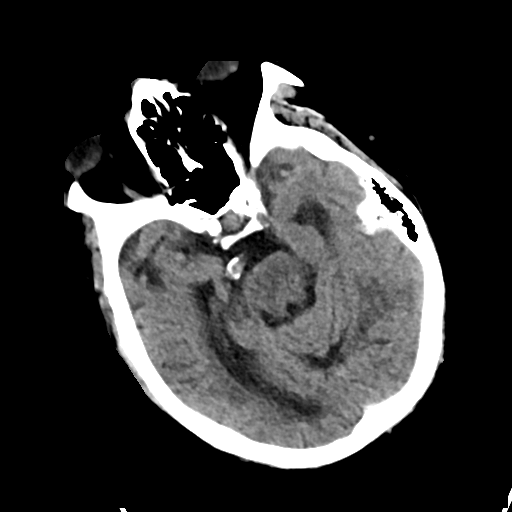
[im 18/35  brain]
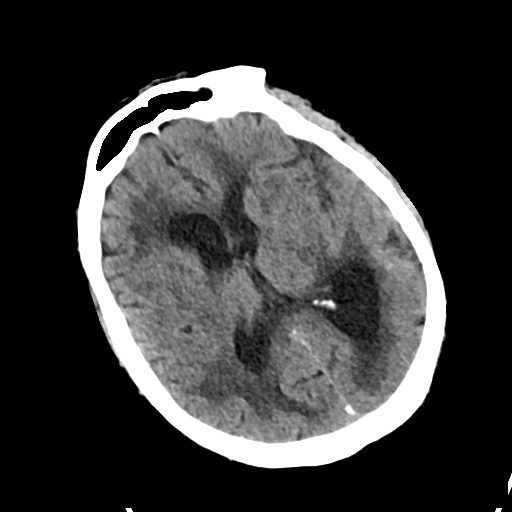
[im 22/35  brain]
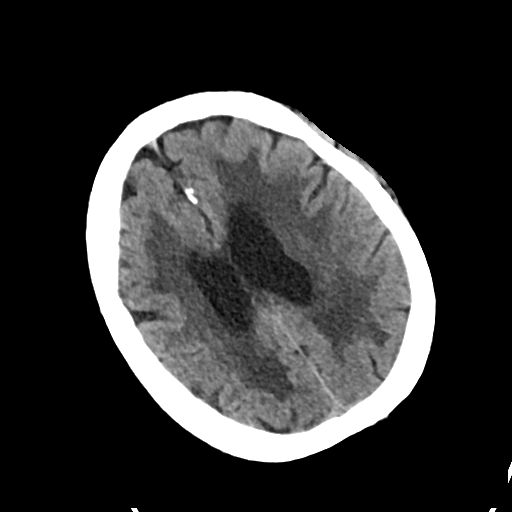
[im 22/35  bone]
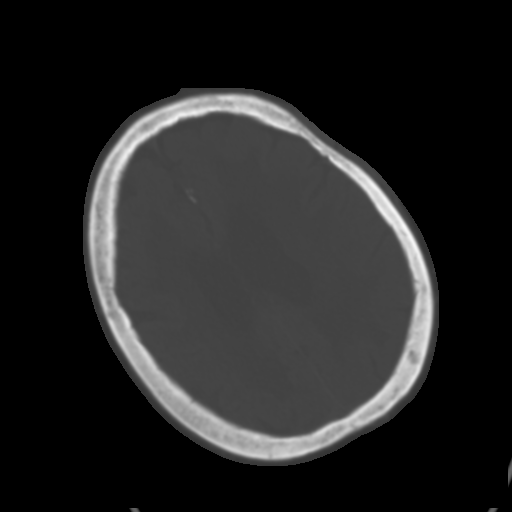
[im 26/35  brain]
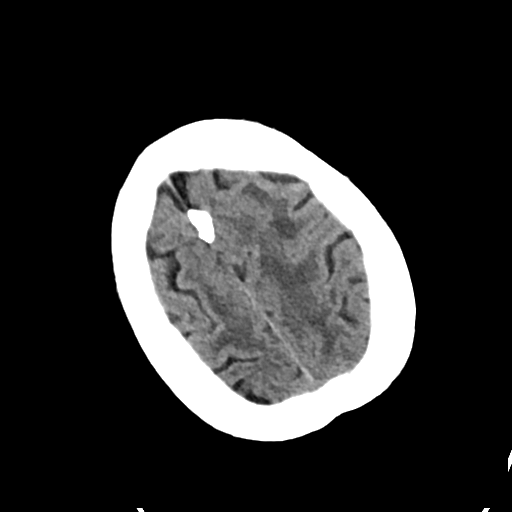
[im 30/35  brain]
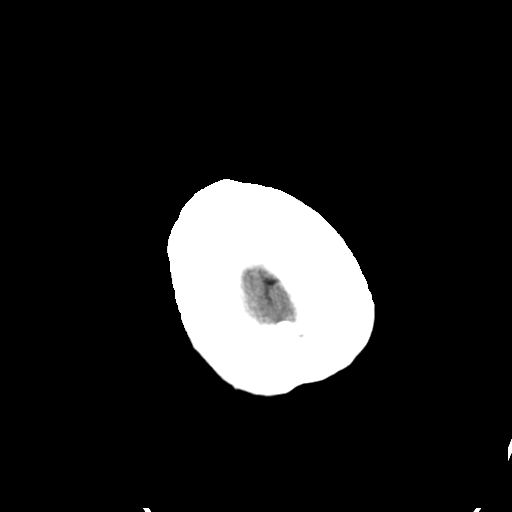

[Series 4: head bone · axial · 0.44mm/px · z∈[-113,+7]mm · 7 of 86 slices shown]
[im 9/86  bone]
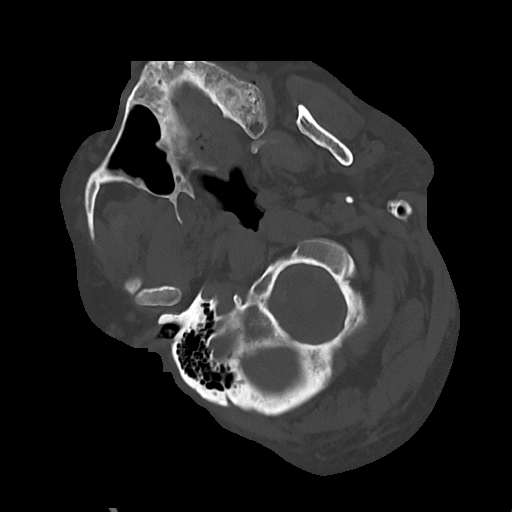
[im 18/86  bone]
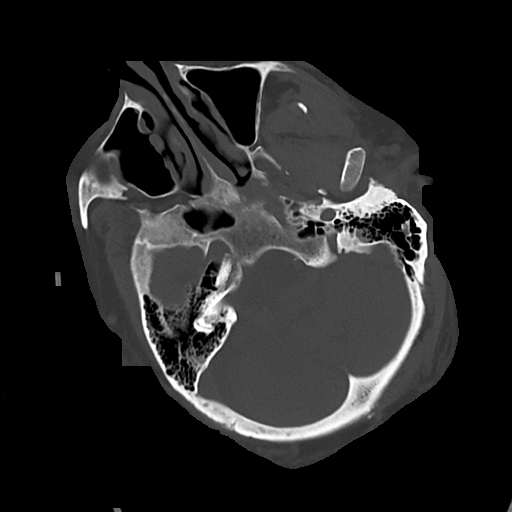
[im 26/86  bone]
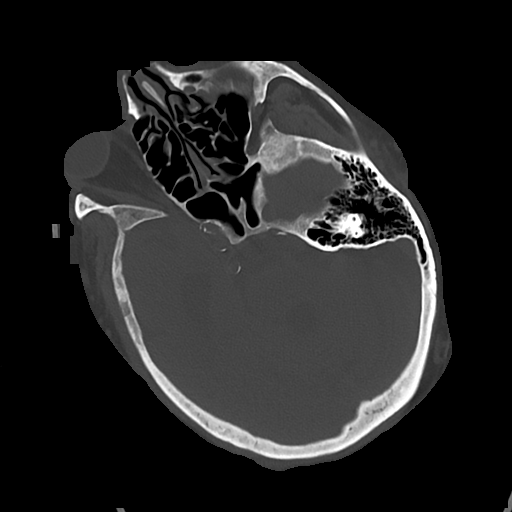
[im 39/86  bone]
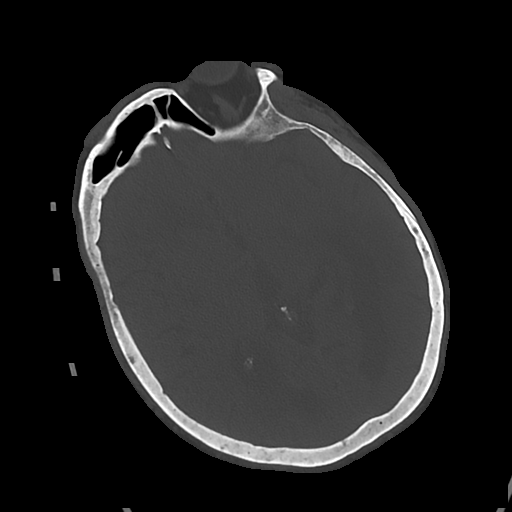
[im 47/86  bone]
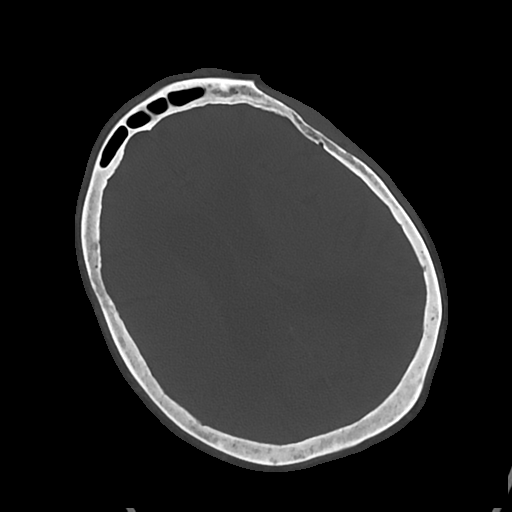
[im 60/86  bone]
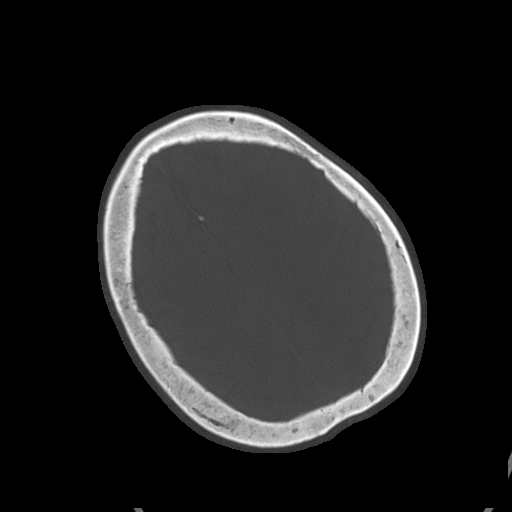
[im 69/86  bone]
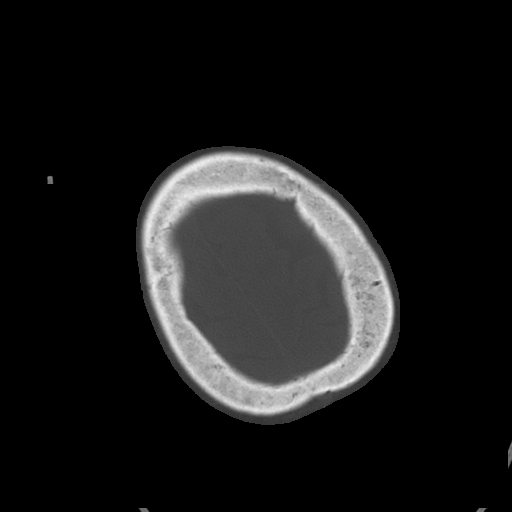

[Series 6: sag soft · sagittal · 0.36mm/px · 3 of 68 slices shown]
[im 23/68  brain]
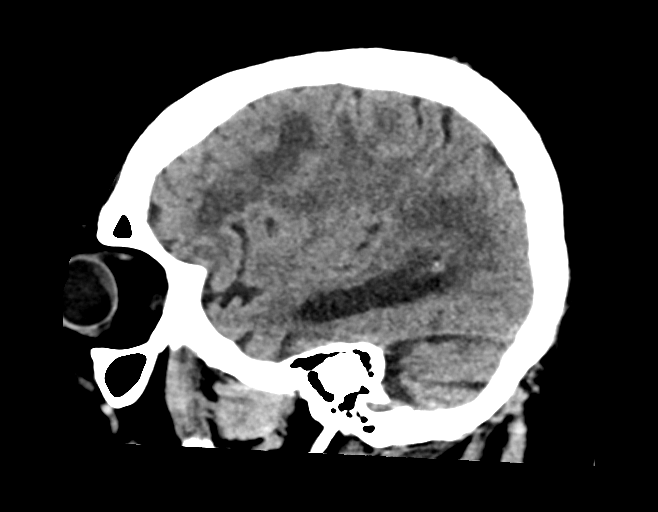
[im 34/68  brain]
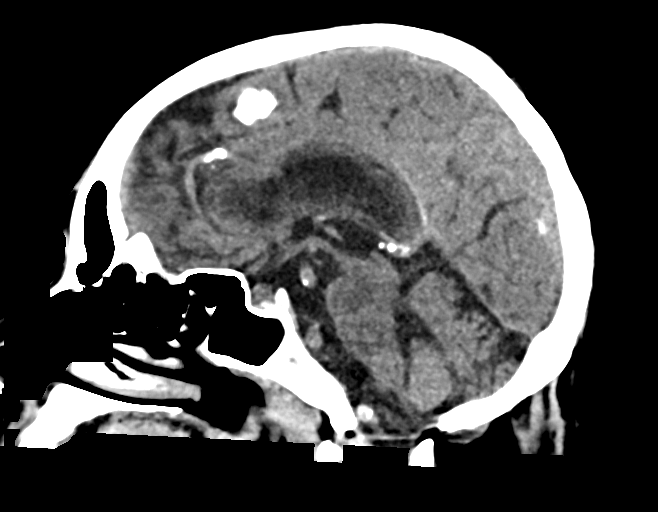
[im 45/68  brain]
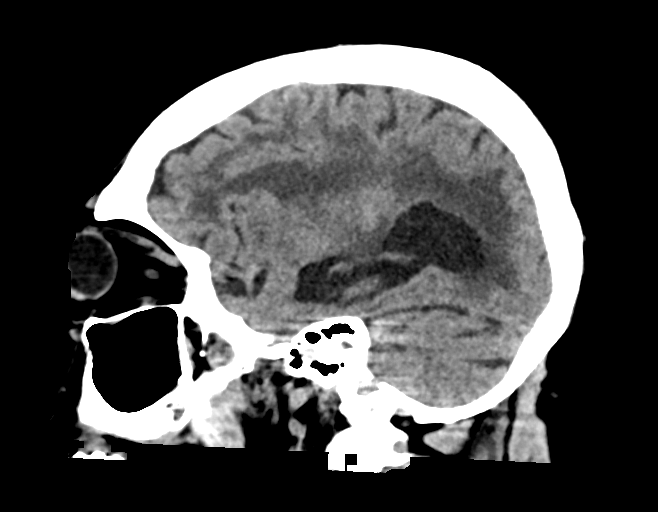

[17 of 37 positions shown; findings below may reference images not displayed]

FINDINGS: Brain: Diffuse cerebral atrophy and extensive chronic small vessel
disease throughout the deep white matter. No acute intracranial
abnormality. Specifically, no hemorrhage, hydrocephalus, mass
lesion, acute infarction, or significant intracranial injury.

Vascular: No hyperdense vessel or unexpected calcification.

Skull: No acute calvarial abnormality.

Sinuses/Orbits: Visualized paranasal sinuses and mastoids clear.
Orbital soft tissues unremarkable.

Other: None
IMPRESSION: Atrophy, chronic microvascular disease.

No acute intracranial abnormality.

## 2022-07-09 IMAGING — DX DG CHEST 1V PORT
1 series · 1 of 1 positions shown · non-contrast
Comparison: Radiograph 02/25/2020

CLINICAL DATA: Possible sepsis

EXAM:
PORTABLE CHEST 1 VIEW

[chest ap]
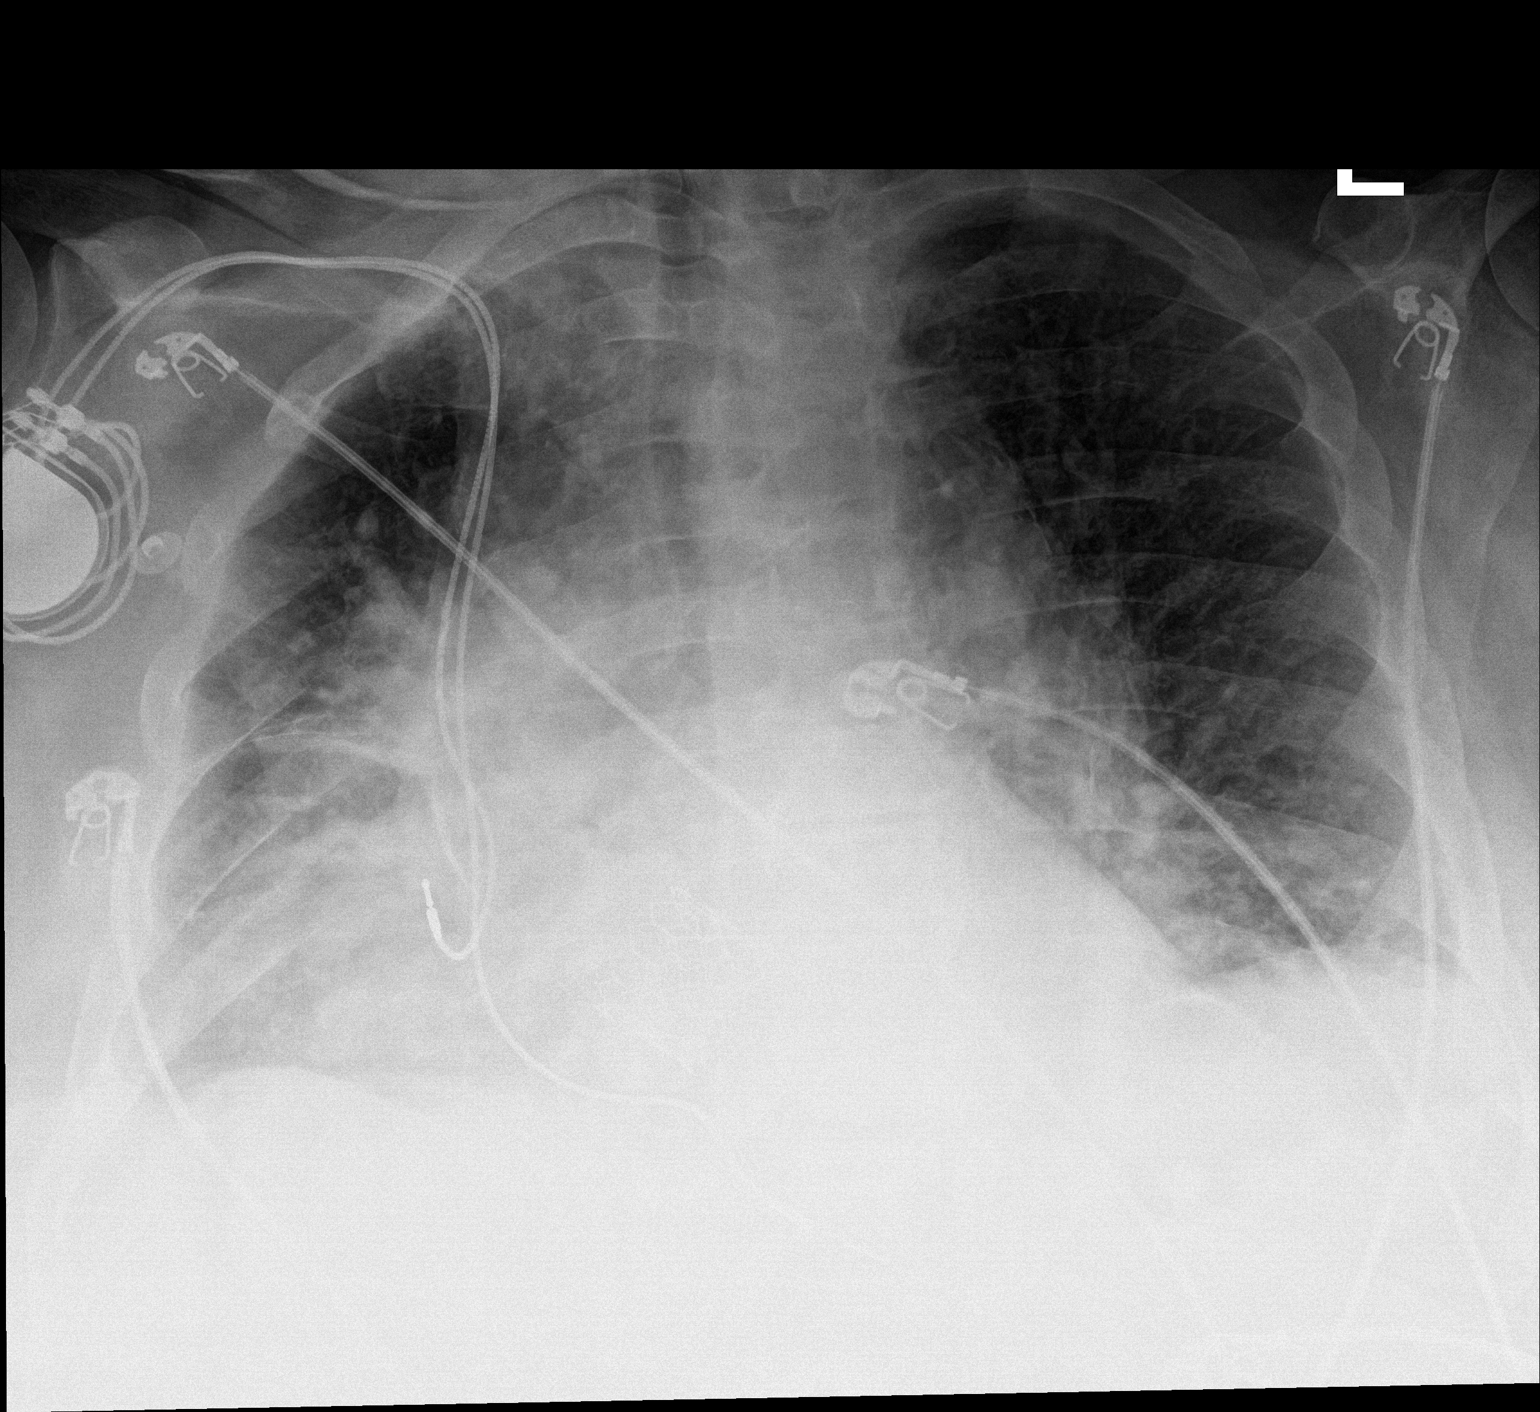

[1 of 1 positions shown; findings below may reference images not displayed]

FINDINGS: Patient is rotated in a left anterior obliquity superimposing much
of the mediastinum over the right chest wall. The heart is enlarged
though similar to comparisons. Pacer pack overlies the right chest
wall with leads at the cardiac apex and right atrium. Aortic valve
replacement is noted as well. There are hazy mid to lower lung
predominant opacities as well as more consolidative opacity in the
right lung base on a background of pulmonary vascular congestion
with right fissural thickening as well. Suspect at least a small
right effusion. No visible left effusion. No pneumothorax. No acute
osseous or soft tissue abnormality. Telemetry leads overlie the
chest.
IMPRESSION: Findings are concerning for CHF/volume overload with edema though a
superimposed infectious process could be present particularly in the
regions of more focal consolidation in the right lower lung.
